# Patient Record
Sex: Male | Born: 1955 | Race: White | Hispanic: No | Smoking: Former smoker
Health system: Southern US, Community
[De-identification: ages and names within clinical notes are randomized; demographics above are authoritative.]

## PROBLEM LIST (undated history)

## (undated) DIAGNOSIS — F32A Depression, unspecified: Secondary | ICD-10-CM

## (undated) DIAGNOSIS — R0600 Dyspnea, unspecified: Secondary | ICD-10-CM

## (undated) DIAGNOSIS — S2239XA Fracture of one rib, unspecified side, initial encounter for closed fracture: Secondary | ICD-10-CM

## (undated) DIAGNOSIS — K08109 Complete loss of teeth, unspecified cause, unspecified class: Secondary | ICD-10-CM

## (undated) DIAGNOSIS — J449 Chronic obstructive pulmonary disease, unspecified: Secondary | ICD-10-CM

## (undated) DIAGNOSIS — J45909 Unspecified asthma, uncomplicated: Secondary | ICD-10-CM

## (undated) DIAGNOSIS — F329 Major depressive disorder, single episode, unspecified: Secondary | ICD-10-CM

## (undated) DIAGNOSIS — S2249XA Multiple fractures of ribs, unspecified side, initial encounter for closed fracture: Secondary | ICD-10-CM

## (undated) DIAGNOSIS — R0902 Hypoxemia: Secondary | ICD-10-CM

## (undated) DIAGNOSIS — F319 Bipolar disorder, unspecified: Secondary | ICD-10-CM

## (undated) DIAGNOSIS — J189 Pneumonia, unspecified organism: Secondary | ICD-10-CM

## (undated) DIAGNOSIS — C61 Malignant neoplasm of prostate: Secondary | ICD-10-CM

## (undated) DIAGNOSIS — C189 Malignant neoplasm of colon, unspecified: Secondary | ICD-10-CM

## (undated) DIAGNOSIS — J439 Emphysema, unspecified: Secondary | ICD-10-CM

## (undated) DIAGNOSIS — J9620 Acute and chronic respiratory failure, unspecified whether with hypoxia or hypercapnia: Secondary | ICD-10-CM

## (undated) DIAGNOSIS — Z9981 Dependence on supplemental oxygen: Secondary | ICD-10-CM

## (undated) DIAGNOSIS — Z972 Presence of dental prosthetic device (complete) (partial): Secondary | ICD-10-CM

## (undated) HISTORY — DX: Presence of dental prosthetic device (complete) (partial): Z97.2

## (undated) HISTORY — PX: HERNIA REPAIR: SHX51

## (undated) HISTORY — DX: Hypoxemia: R09.02

## (undated) HISTORY — PX: PROSTATE BIOPSY: SHX241

## (undated) HISTORY — DX: Complete loss of teeth, unspecified cause, unspecified class: K08.109

---

## 2000-12-22 ENCOUNTER — Encounter: Payer: Self-pay | Admitting: *Deleted

## 2000-12-22 ENCOUNTER — Emergency Department (HOSPITAL_COMMUNITY): Admission: EM | Admit: 2000-12-22 | Discharge: 2000-12-22 | Payer: Self-pay | Admitting: *Deleted

## 2009-06-20 ENCOUNTER — Emergency Department (HOSPITAL_COMMUNITY)
Admission: EM | Admit: 2009-06-20 | Discharge: 2009-06-21 | Payer: Self-pay | Source: Home / Self Care | Admitting: Emergency Medicine

## 2010-04-09 LAB — CBC
HCT: 45.1 % (ref 39.0–52.0)
Hemoglobin: 15.4 g/dL (ref 13.0–17.0)
MCHC: 34.1 g/dL (ref 30.0–36.0)
MCV: 95.8 fL (ref 78.0–100.0)
Platelets: 230 10*3/uL (ref 150–400)
RBC: 4.71 MIL/uL (ref 4.22–5.81)
RDW: 13.1 % (ref 11.5–15.5)
WBC: 12.2 10*3/uL — ABNORMAL HIGH (ref 4.0–10.5)

## 2010-04-09 LAB — BASIC METABOLIC PANEL
BUN: 7 mg/dL (ref 6–23)
CO2: 25 mEq/L (ref 19–32)
Calcium: 8.5 mg/dL (ref 8.4–10.5)
Chloride: 98 mEq/L (ref 96–112)
Creatinine, Ser: 1.06 mg/dL (ref 0.4–1.5)
GFR calc Af Amer: >60 mL/min (ref >60)
GFR calc non Af Amer: >60 mL/min (ref >60)
Glucose, Bld: 130 mg/dL — ABNORMAL HIGH (ref 70–99)
Potassium: 3.7 mEq/L (ref 3.5–5.1)
Sodium: 132 mEq/L — ABNORMAL LOW (ref 135–145)

## 2010-04-09 LAB — DIFFERENTIAL
Basophils Absolute: 0 10*3/uL (ref 0.0–0.1)
Basophils Relative: 0 % (ref 0–1)
Eosinophils Absolute: 0 10*3/uL (ref 0.0–0.7)
Eosinophils Relative: 0 % (ref 0–5)
Lymphocytes Relative: 16 % (ref 12–46)
Lymphs Abs: 2 10*3/uL (ref 0.7–4.0)
Monocytes Absolute: 1 10*3/uL (ref 0.1–1.0)
Monocytes Relative: 8 % (ref 3–12)
Neutro Abs: 9.2 10*3/uL — ABNORMAL HIGH (ref 1.7–7.7)
Neutrophils Relative %: 76 % (ref 43–77)

## 2010-10-26 ENCOUNTER — Observation Stay (HOSPITAL_COMMUNITY)
Admission: EM | Admit: 2010-10-26 | Discharge: 2010-10-26 | Disposition: A | Discharge status: Home / Self Care | Payer: Medicaid Other | Attending: Emergency Medicine | Admitting: Emergency Medicine

## 2010-10-26 ENCOUNTER — Emergency Department (HOSPITAL_COMMUNITY): Payer: Medicaid Other

## 2010-10-26 DIAGNOSIS — J9801 Acute bronchospasm: Principal | ICD-10-CM | POA: Insufficient documentation

## 2010-10-26 DIAGNOSIS — R0602 Shortness of breath: Secondary | ICD-10-CM | POA: Insufficient documentation

## 2010-10-26 LAB — POCT I-STAT, CHEM 8
Calcium, Ion: 1.05 mmol/L — ABNORMAL LOW (ref 1.12–1.32)
Creatinine, Ser: 1.3 mg/dL (ref 0.50–1.35)
Glucose, Bld: 186 mg/dL — ABNORMAL HIGH (ref 70–99)
HCT: 50 % (ref 39.0–52.0)
Hemoglobin: 17 g/dL (ref 13.0–17.0)
TCO2: 25 mmol/L (ref 0–100)

## 2010-10-26 LAB — CBC
Hemoglobin: 16.2 g/dL (ref 13.0–17.0)
MCH: 32.9 pg (ref 26.0–34.0)
MCHC: 35.7 g/dL (ref 30.0–36.0)
RDW: 12.9 % (ref 11.5–15.5)

## 2010-10-26 LAB — POCT I-STAT 3, VENOUS BLOOD GAS (G3P V)
Acid-Base Excess: 2 mmol/L (ref 0.0–2.0)
Bicarbonate: 28.5 mEq/L — ABNORMAL HIGH (ref 20.0–24.0)
O2 Saturation: 80 %
TCO2: 30 mmol/L (ref 0–100)
pCO2, Ven: 52 mmHg — ABNORMAL HIGH (ref 45.0–50.0)
pO2, Ven: 48 mmHg — ABNORMAL HIGH (ref 30.0–45.0)

## 2010-10-26 LAB — DIFFERENTIAL
Basophils Absolute: 0 10*3/uL (ref 0.0–0.1)
Basophils Relative: 0 % (ref 0–1)
Eosinophils Absolute: 0.5 10*3/uL (ref 0.0–0.7)
Eosinophils Relative: 5 % (ref 0–5)
Lymphocytes Relative: 52 % — ABNORMAL HIGH (ref 12–46)
Monocytes Absolute: 0.7 10*3/uL (ref 0.1–1.0)

## 2010-10-26 LAB — POCT I-STAT TROPONIN I: Troponin i, poc: 0 ng/mL (ref 0.00–0.08)

## 2011-04-02 ENCOUNTER — Emergency Department (HOSPITAL_COMMUNITY)
Admission: EM | Admit: 2011-04-02 | Discharge: 2011-04-03 | Disposition: A | Discharge status: Home / Self Care | Payer: Medicaid Other | Attending: Emergency Medicine | Admitting: Emergency Medicine

## 2011-04-02 ENCOUNTER — Encounter (HOSPITAL_COMMUNITY): Payer: Self-pay | Admitting: *Deleted

## 2011-04-02 DIAGNOSIS — C189 Malignant neoplasm of colon, unspecified: Secondary | ICD-10-CM | POA: Insufficient documentation

## 2011-04-02 DIAGNOSIS — R300 Dysuria: Secondary | ICD-10-CM | POA: Insufficient documentation

## 2011-04-02 DIAGNOSIS — K4090 Unilateral inguinal hernia, without obstruction or gangrene, not specified as recurrent: Secondary | ICD-10-CM | POA: Insufficient documentation

## 2011-04-02 DIAGNOSIS — R109 Unspecified abdominal pain: Secondary | ICD-10-CM | POA: Insufficient documentation

## 2011-04-02 HISTORY — DX: Major depressive disorder, single episode, unspecified: F32.9

## 2011-04-02 HISTORY — DX: Depression, unspecified: F32.A

## 2011-04-02 NOTE — ED Notes (Signed)
Pt in c/o right lower abd pain, states he has an inguinal hernia and feels like this is causing the pain

## 2011-04-03 ENCOUNTER — Emergency Department (HOSPITAL_COMMUNITY): Payer: Medicaid Other

## 2011-04-03 LAB — URINALYSIS, ROUTINE W REFLEX MICROSCOPIC
Bilirubin Urine: NEGATIVE
Leukocytes, UA: NEGATIVE
Nitrite: NEGATIVE
Specific Gravity, Urine: 1.023 (ref 1.005–1.030)
Urobilinogen, UA: 0.2 mg/dL (ref 0.0–1.0)
pH: 5 (ref 5.0–8.0)

## 2011-04-03 LAB — CBC
HCT: 47.7 % (ref 39.0–52.0)
RBC: 5.17 MIL/uL (ref 4.22–5.81)
RDW: 12.5 % (ref 11.5–15.5)

## 2011-04-03 LAB — DIFFERENTIAL
Basophils Absolute: 0 10*3/uL (ref 0.0–0.1)
Lymphocytes Relative: 37 % (ref 12–46)
Monocytes Absolute: 0.6 10*3/uL (ref 0.1–1.0)
Neutro Abs: 3.6 10*3/uL (ref 1.7–7.7)
Neutrophils Relative %: 50 % (ref 43–77)

## 2011-04-03 LAB — POCT I-STAT, CHEM 8
BUN: 9 mg/dL (ref 6–23)
Calcium, Ion: 1.18 mmol/L (ref 1.12–1.32)
Chloride: 104 mEq/L (ref 96–112)
Creatinine, Ser: 1.1 mg/dL (ref 0.50–1.35)
Glucose, Bld: 118 mg/dL — ABNORMAL HIGH (ref 70–99)
TCO2: 27 mmol/L (ref 0–100)

## 2011-04-03 MED ORDER — KETOROLAC TROMETHAMINE 30 MG/ML IJ SOLN
30.0000 mg | Freq: Once | INTRAMUSCULAR | Status: AC
  Administered 2011-04-03: 30 mg via INTRAVENOUS
  Filled 2011-04-03: qty 1

## 2011-04-03 MED ORDER — HYDROCODONE-ACETAMINOPHEN 5-500 MG PO TABS: 1.0000 | ORAL_TABLET | Freq: Four times a day (QID) | ORAL | Status: AC | PRN

## 2011-04-03 NOTE — ED Provider Notes (Signed)
History     CSN: [id number]  Arrival date & time 04/02/11  2119   First MD Initiated Contact with Patient 04/03/11 0210      Chief Complaint  Patient presents with  . Abdominal Pain    (Consider location/radiation/quality/duration/timing/severity/associated sxs/prior treatment) Patient is a 56 y.o. male presenting with abdominal pain. The history is provided by the patient. No language interpreter was used.  Abdominal Pain The primary symptoms of the illness include abdominal pain and dysuria. The primary symptoms of the illness do not include fatigue, shortness of breath, nausea, vomiting, diarrhea, hematemesis or hematochezia. Primary symptoms comment: 3 weeks The current episode started more than 2 days ago. The onset of the illness was gradual. The problem has been gradually worsening.  The abdominal pain is located in the groin (right groin). The abdominal pain does not radiate. The severity of the abdominal pain is 9/10. The abdominal pain is relieved by nothing. The abdominal pain is exacerbated by vomiting.  The dysuria is associated with frequency. The dysuria is not associated with hematuria.  Additional symptoms associated with the illness include frequency. Symptoms associated with the illness do not include anorexia, diaphoresis, constipation or hematuria. Significant associated medical issues do not include HIV or cardiac disease.  On repeat questioning may have had some change in stool caliber in the past several weeks.    Past Medical History  Diagnosis Date  . Inguinal hernia   . Depression     History reviewed. No pertinent past surgical history.  History reviewed. No pertinent family history.  History  Substance Use Topics  . Smoking status: Not on file  . Smokeless tobacco: Not on file  . Alcohol Use:       Review of Systems  Constitutional: Negative for diaphoresis and fatigue.  HENT: Negative.   Respiratory: Negative for shortness of breath.     Cardiovascular: Negative for chest pain.  Gastrointestinal: Positive for abdominal pain. Negative for nausea, vomiting, diarrhea, constipation, hematochezia, abdominal distention, anorexia and hematemesis.  Genitourinary: Positive for dysuria and frequency. Negative for hematuria.  Musculoskeletal: Negative.   Skin: Negative.   Neurological: Negative.   Hematological: Negative.   Psychiatric/Behavioral: Negative.     Allergies  Codeine  Home Medications   Current Outpatient Rx  Name Route Sig Dispense Refill  . ARIPIPRAZOLE 10 MG PO TABS Oral Take 10 mg by mouth daily.    Marland Kitchen CLONAZEPAM 0.5 MG PO TABS Oral Take 0.5 mg by mouth every morning.    Marland Kitchen ESCITALOPRAM OXALATE 20 MG PO TABS Oral Take 20 mg by mouth daily.    . TRAZODONE HCL 50 MG PO TABS Oral Take 50 mg by mouth at bedtime.      BP 113/65  Pulse 73  Temp(Src) 98.1 F (36.7 C) (Oral)  Resp 20  SpO2 95%  Physical Exam  Constitutional: He is oriented to person, place, and time. He appears well-developed and well-nourished. No distress.  HENT:  Head: Normocephalic and atraumatic.  Mouth/Throat: Oropharynx is clear and moist. No oropharyngeal exudate.  Eyes: Conjunctivae are normal. Pupils are equal, round, and reactive to light.  Neck: Normal range of motion. Neck supple.  Cardiovascular: Normal rate and regular rhythm.   Pulmonary/Chest: Effort normal and breath sounds normal. He has no wheezes. He has no rales.  Abdominal: Soft. Bowel sounds are normal. There is no tenderness. There is no rebound and no guarding.       Small inguinal hernia  Musculoskeletal: Normal range of motion.  Neurological: He is alert and oriented to person, place, and time.  Skin: Skin is warm and dry.  Psychiatric: He has a normal mood and affect.    ED Course  Procedures (including critical care time)  Labs Reviewed  POCT I-STAT, CHEM 8 - Abnormal; Notable for the following:    Glucose, Bld 118 (*)    All other components within  normal limits  CBC  DIFFERENTIAL  URINALYSIS, ROUTINE W REFLEX MICROSCOPIC  URINE CULTURE   Ct Abdomen Pelvis Wo Contrast  04/03/2011  *RADIOLOGY REPORT*  Clinical Data: Right groin and lower abdominal pain.  CT ABDOMEN AND PELVIS WITHOUT CONTRAST  Technique:  Multidetector CT imaging of the abdomen and pelvis was performed following the standard protocol without intravenous contrast.  Comparison: None.  Findings: Limited images through the lung bases demonstrate no significant appreciable abnormality. The heart size is within normal limits. No pleural or pericardial effusion.  Intra-abdominal organ evaluation is limited without intravenous contrast.  Within this limitation, unremarkable liver, spleen, pancreas, biliary system, adrenal glands.  Symmetric renal size.  No hydronephrosis or hydroureter.  There is eccentric sigmoid colonic wall thickening and a 1.9 cm soft tissue attenuation that abuts the sigmoid colon wall. This may represent nodal conglomerate.  There are additional subcentimeter regional lymph nodes. Colonic diverticulosis.  Normal appendix.  No bowel obstruction.  No free intraperitoneal air or fluid.  There is scattered atherosclerotic calcification of the aorta and its branches. No aneurysmal dilatation.  Circumferential bladder wall thickening is nonspecific given incomplete distension.  Bilateral fat containing inguinal hernias, right greater than left.  Multilevel degenerative changes of the imaged spine. No acute or aggressive appearing osseous lesion.  IMPRESSION: Sigmoid colonic wall thickening and pericolonic soft tissue attenuation/nodes.  This is concerning for adenocarcinoma. Recommend colonoscopy.  Fat containing right inguinal hernia.  Discussed via telephone with Dr. Nicanor Alcon 03:45 a.m. on 04/03/2011.  Original Report Authenticated By: Waneta Martins, M.D.     No diagnosis found.    MDM  420 case d/w Dr. Madilyn Fireman of GI who took patient's information.  Call office in  am for close follow up CT results discussed with patient, patient informed CT findings highly suspicious for cancer of colon and that he will need to call Dr. Madilyn Fireman office in am for colonoscopy and ongoing care.  Patient verbalizes understanding and agrees to follow up.  Dr. Florina Ou name and contact information provided on d/c paper work and referral to general surgery for fat containing hernia        Betsabe Iglesia K Delwin Raczkowski-Rasch, MD 04/03/11 [phone]

## 2011-04-03 NOTE — Discharge Instructions (Signed)
Hernia  A hernia occurs when an internal organ pushes out through a weak spot in the abdominal wall. Hernias most commonly occur in the groin and around the navel. Hernias often can be pushed back into place (reduced). Most hernias tend to get worse over time. Some abdominal hernias can get stuck in the opening (irreducible or incarcerated hernia) and cannot be reduced. An irreducible abdominal hernia which is tightly squeezed into the opening is at risk for impaired blood supply (strangulated hernia). A strangulated hernia is a medical emergency. Because of the risk for an irreducible or strangulated hernia, surgery may be recommended to repair a hernia.  CAUSES    Heavy lifting.   Prolonged coughing.   Straining to have a bowel movement.   A cut (incision) made during an abdominal surgery.  HOME CARE INSTRUCTIONS    Bed rest is not required. You may continue your normal activities.   Avoid lifting more than 10 pounds (4.5 kg) or straining.   Cough gently. If you are a smoker it is best to stop. Even the best hernia repair can break down with the continual strain of coughing. Even if you do not have your hernia repaired, a cough will continue to aggravate the problem.   Do not wear anything tight over your hernia. Do not try to keep it in with an outside bandage or truss. These can damage abdominal contents if they are trapped within the hernia sac.   Eat a normal diet.   Avoid constipation. Straining over long periods of time will increase hernia size and encourage breakdown of repairs. If you cannot do this with diet alone, stool softeners may be used.  SEEK IMMEDIATE MEDICAL CARE IF:    You have a fever.   You develop increasing abdominal pain.   You feel nauseous or vomit.   Your hernia is stuck outside the abdomen, looks discolored, feels hard, or is tender.   You have any changes in your bowel habits or in the hernia that are unusual for you.   You have increased pain or swelling around the  hernia.   You cannot push the hernia back in place by applying gentle pressure while lying down.  MAKE SURE YOU:    Understand these instructions.   Will watch your condition.   Will get help right away if you are not doing well or get worse.  Document Released: 01/07/2005 Document Revised: 12/27/2010 Document Reviewed: 08/27/2007  ExitCare Patient Information 2012 [employer].

## 2011-04-05 LAB — URINE CULTURE: Culture  Setup Time: 201303130828

## 2011-04-06 NOTE — ED Notes (Signed)
+   Urine Chart sent to EDP office for review. 

## 2011-04-07 NOTE — ED Notes (Signed)
Chart back from EDP office; no further treatment necessary

## 2011-04-08 ENCOUNTER — Other Ambulatory Visit (INDEPENDENT_AMBULATORY_CARE_PROVIDER_SITE_OTHER): Payer: Self-pay | Admitting: Surgery

## 2011-04-08 ENCOUNTER — Encounter (INDEPENDENT_AMBULATORY_CARE_PROVIDER_SITE_OTHER): Payer: Self-pay | Admitting: Surgery

## 2011-04-08 ENCOUNTER — Ambulatory Visit (INDEPENDENT_AMBULATORY_CARE_PROVIDER_SITE_OTHER): Payer: Medicaid Other | Admitting: Surgery

## 2011-04-08 VITALS — BP 120/82 | HR 80 | Temp 98.2°F | Resp 18 | Ht 71.0 in | Wt 193.6 lb

## 2011-04-08 DIAGNOSIS — C189 Malignant neoplasm of colon, unspecified: Secondary | ICD-10-CM | POA: Insufficient documentation

## 2011-04-08 NOTE — Progress Notes (Signed)
Chief Complaint  Patient presents with  . Colon Cancer    new pt- eval sigmoid colon cancer - referral from Dr. Vida Rigger    HISTORY: Patient is a 56 year old white male who had noted 3-4 weeks ago a significant change in his bowel movements. He noted that his bowel movements have become slow and more difficult. He denies any bleeding per rectum. He denies any recent changes in his weight. Patient developed some mild abdominal distention and presented to the emergency department. A CT scan of the abdomen and pelvis was performed on March 13. This showed thickening in the manner of the sigmoid colon with associated lymphadenopathy worrisome for adenocarcinoma. The patient was seen the next day by Dr. Leary Roca and underwent colonoscopy.  This showed a tumor at approximately 20 cm from the anus which was near obstructing. The scope was able to be passed proximally into the transverse colon. Findings were consistent with adenocarcinoma of the sigmoid colon. Patient is referred at this time for surgical assessment for resection.  Concurrently, the patient does have a right inguinal hernia which is mildly symptomatic. He has had no prior abdominal surgery. He has had no prior hernia repairs. There is no family history of colon cancer.  Past Medical History  Diagnosis Date  . Inguinal hernia   . Depression   . Colon tumor      Current Outpatient Prescriptions  Medication Sig Dispense Refill  . ARIPiprazole (ABILIFY) 10 MG tablet Take 10 mg by mouth daily.      . clonazePAM (KLONOPIN) 0.5 MG tablet Take 0.5 mg by mouth every morning.      . escitalopram (LEXAPRO) 20 MG tablet Take 20 mg by mouth daily.      Marland Kitchen HYDROcodone-acetaminophen (VICODIN) 5-500 MG per tablet Take 1 tablet by mouth every 6 (six) hours as needed for pain.  10 tablet  0  . traZODone (DESYREL) 50 MG tablet Take 50 mg by mouth at bedtime.         Allergies  Allergen Reactions  . Codeine Nausea And Vomiting     Family  History  Problem Relation Age of Onset  . Heart disease Father      History   Social History  . Marital Status: Legally Separated    Spouse Name: N/A    Number of Children: N/A  . Years of Education: N/A   Social History Main Topics  . Smoking status: Current Everyday Smoker -- 1.0 packs/day  . Smokeless tobacco: None  . Alcohol Use: No  . Drug Use: No  . Sexually Active:    Other Topics Concern  . None   Social History Narrative  . None     REVIEW OF SYSTEMS - PERTINENT POSITIVES ONLY: Recent change in bowel habits with mild obstructive symptoms. No bleeding per rectum. No weight changes.  EXAM: Filed Vitals:   04/08/11 1649  BP: 120/82  Pulse: 80  Temp: 98.2 F (36.8 C)  Resp: 18    HEENT: normocephalic; pupils equal and reactive; sclerae clear; dentition good; mucous membranes moist NECK:  symmetric on extension; no palpable anterior or posterior cervical lymphadenopathy; no supraclavicular masses; no tenderness CHEST: clear to auscultation bilaterally without rales, rhonchi, or wheezes CARDIAC: regular rate and rhythm without significant murmur; peripheral pulses are full ABDOMEN: soft without distension; bowel sounds present; no mass; no hepatosplenomegaly; no hernia EXT:  non-tender without edema; no deformity NEURO: no gross focal deficits; no sign of tremor   LABORATORY RESULTS: See  [facility] ([facility]-Epic) for most recent results   RADIOLOGY RESULTS: See [facility] ([facility]L-Epic) for most recent results   IMPRESSION: #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 I have recommended that he undergo exploratory laparotomy with sigmoid colectomy. I quoted him a risk of colostomy placement of less than 5%.  I have recommended that the patient proceed with surgery in the near future. I am  concerned about possible obstruction. I will offer him surgery dates later this week. Patient I discussed the hospital stay to be anticipated. We discussed potential complications including bleeding and infection. We discussed the postoperative recovery.  We discussed the need for consultation from medical oncology and the possible need for adjuvant chemotherapy or radiation therapy.  He understands and wishes to proceed with surgery in the near future.  The risks and benefits of the procedure have been discussed at length with the patient.  The patient understands the proposed procedure, potential alternative treatments, and the course of recovery to be expected.  All of the patient's questions have been answered at this time.  The patient wishes to proceed with surgery and will schedule a date for the procedure through our office staff.  Velora Heckler, MD, FACS General & Endocrine Surgery [facility]   Visit Diagnoses: 1. Colon cancer, sigmoid     Primary Care Physician: Lynne Leader, MD, MD  GI:  Dr. Vida Rigger

## 2011-04-08 NOTE — Patient Instructions (Signed)
Cancer of the Colon, Treatment by Resection You and your caregiver have decided that surgical removal of your colon cancer is the best form of treatment for you. Your surgeon or surgeons will do their best to remove your entire tumor. To do this, some normal tissue must also be removed to give you the best chance for a cure. The following will help describe what happens when you have this surgery. TREATMENT  Surgery is the most common treatment for colorectal cancer. It is a type of local therapy. It treats the cancer in the colon or rectum and the area close to the tumor by removing the tumor and some of the healthy tissue around it. For larger cancers, your surgeon must make an cut (incision) into the belly (abdomen) so he or she can see the area of the tumor and remove it as well as part of the healthy colon or rectum. Some nearby lymph nodes also may be removed. The surgeon checks the rest of the abdomen, the intestine and the liver to see if the cancer has spread. When a section of the colon or rectum is removed, the surgeon can usually reconnect the healthy parts. However, sometimes reconnection is not possible. In this case, the surgeon creates a new path for waste to leave the body. The surgeon makes an opening (a stoma) in the wall of the abdomen. The upper end of the intestine is then connected to the stoma. The other end is closed. The operation to create the stoma is called a colostomy. A flat bag fits over the stoma to collect waste, and a special adhesive holds it in place.  Some colostomies are temporary. The colostomy is needed only until the colon or rectum heals from surgery. After healing takes place, the surgeon reconnects the parts of the intestine and closes the stoma. Other patients need a permanent colostomy.  ASK YOUR CAREGIVER THESE QUESTIONS BEFORE HAVING SURGERY:  What kind of operation do you recommend for me?   Do I need any lymph nodes removed? Will other tissues be removed?  Why?   What are the risks of surgery? Will I have any lasting side effects?   Will I need a colostomy? If so, will it be permanent?   How will I feel after the operation?   If I have pain, how will it be controlled?   How long will I be in the hospital?   When can I get back to my normal activities?  FOLLOW-UP CARE  Follow-up care after treatment for colorectal cancer is important. Even when the cancer seems to have been completely removed or destroyed, the disease sometimes returns. Undetected cancer cells may still remain somewhere in the body after treatment. The doctor keeps checking the person's recovery and checks for recurrence of the cancer. Recurrence means that the cancer comes back.  Checkups help make sure that changes in health are found. Checkups may include:  A physical exam (including a digital rectal exam). This means your caregiver checks you to see if there are any abnormal changes they can see or feel.   Lab tests (including fecal occult blood test and CEA test) may be done. The "fecal occult blood test" checks for blood in the stool. The CEA (carcinoembryonic antigen) is a blood test that looks for a marker of colon cancer in the blood.   A colonoscopy is a test where your caregiver examines your colon with a flexible instrument like a thin telescope which looks at the inside   of the large bowel.   Other specialized x-rays, CT scans, or other tests may be performed.  Between scheduled visits you should contact your caregivers as soon as any health problems appear. Document Released: 01/10/2003 Document Revised: 12/27/2010 Document Reviewed: 05/05/2007 ExitCare Patient Information 2012 [employer]. 

## 2011-04-09 ENCOUNTER — Encounter (INDEPENDENT_AMBULATORY_CARE_PROVIDER_SITE_OTHER): Payer: Self-pay

## 2011-04-09 ENCOUNTER — Encounter (HOSPITAL_COMMUNITY): Payer: Self-pay | Admitting: Pharmacy Technician

## 2011-04-10 ENCOUNTER — Encounter (INDEPENDENT_AMBULATORY_CARE_PROVIDER_SITE_OTHER): Payer: Self-pay

## 2011-04-10 ENCOUNTER — Encounter (HOSPITAL_COMMUNITY)
Admission: RE | Admit: 2011-04-10 | Discharge: 2011-04-10 | Disposition: A | Discharge status: Home / Self Care | Payer: Medicaid Other | Source: Ambulatory Visit | Attending: Surgery | Admitting: Surgery

## 2011-04-10 ENCOUNTER — Encounter (HOSPITAL_COMMUNITY): Payer: Self-pay

## 2011-04-10 LAB — CBC
HCT: 51.2 % (ref 39.0–52.0)
MCH: 31.2 pg (ref 26.0–34.0)
MCHC: 33.6 g/dL (ref 30.0–36.0)
MCV: 92.8 fL (ref 78.0–100.0)
Platelets: 283 10*3/uL (ref 150–400)
RDW: 12.5 % (ref 11.5–15.5)
WBC: 10 10*3/uL (ref 4.0–10.5)

## 2011-04-10 LAB — COMPREHENSIVE METABOLIC PANEL
AST: 25 U/L (ref 0–37)
Albumin: 4.5 g/dL (ref 3.5–5.2)
BUN: 10 mg/dL (ref 6–23)
Calcium: 9.3 mg/dL (ref 8.4–10.5)
Creatinine, Ser: 1.04 mg/dL (ref 0.50–1.35)
Total Bilirubin: 0.4 mg/dL (ref 0.3–1.2)
Total Protein: 8 g/dL (ref 6.0–8.3)

## 2011-04-10 LAB — DIFFERENTIAL
Basophils Relative: 0 % (ref 0–1)
Lymphocytes Relative: 26 % (ref 12–46)
Monocytes Absolute: 0.9 10*3/uL (ref 0.1–1.0)
Monocytes Relative: 9 % (ref 3–12)
Neutro Abs: 5.9 10*3/uL (ref 1.7–7.7)
Neutrophils Relative %: 59 % (ref 43–77)

## 2011-04-10 LAB — URINALYSIS, ROUTINE W REFLEX MICROSCOPIC
Glucose, UA: NEGATIVE mg/dL
Ketones, ur: NEGATIVE mg/dL
Leukocytes, UA: NEGATIVE
Nitrite: NEGATIVE
Protein, ur: NEGATIVE mg/dL
pH: 5.5 (ref 5.0–8.0)

## 2011-04-10 LAB — CEA: CEA: 2.4 ng/mL (ref 0.0–5.0)

## 2011-04-10 LAB — SURGICAL PCR SCREEN: MRSA, PCR: NEGATIVE

## 2011-04-10 LAB — PROTIME-INR: INR: 0.9 (ref 0.00–1.49)

## 2011-04-10 NOTE — Progress Notes (Signed)
Faxed to [place] 

## 2011-04-10 NOTE — Patient Instructions (Signed)
[name]  04/10/2011   Your procedure is scheduled on:  04/11/11 1250pm-1540pm  Report to [facility] at 1030 AM.  Call this number if you have problems the morning of surgery: [phone]   Remember:   Do not eat food:After Midnight.  May have clear liquids:until Midnight .   Take these medicines the morning of surgery with A SIP OF WATER:    Do not wear jewelry, .  Do not wear lotions, powders, or perfumes  Do not bring valuables to the hospital.  Contacts, dentures or bridgework may not be worn into surgery.  Leave suitcase in the car. After surgery it may be brought to your room.  For patients admitted to the hospital, checkout time is 11:00 AM the day of discharge.     Special Instructions: CHG Shower Use Special Wash: 1/2 bottle night before surgery and 1/2 bottle morning of surgery. shower chin to toes with CHG.  Wash face and private parts with regular soap.     Please read over the following fact sheets that you were given: MRSA Information, Blood Transfusion Fact Sheet, coughing and deep breathing exercises, leg exercises

## 2011-04-10 NOTE — Progress Notes (Signed)
Quick Note:  These results are acceptable for scheduled surgery. TMG ______ 

## 2011-04-11 ENCOUNTER — Encounter (HOSPITAL_COMMUNITY): Payer: Self-pay | Admitting: *Deleted

## 2011-04-11 ENCOUNTER — Encounter (HOSPITAL_COMMUNITY): Payer: Self-pay | Admitting: Surgery

## 2011-04-11 ENCOUNTER — Ambulatory Visit (HOSPITAL_COMMUNITY): Payer: Medicaid Other | Admitting: Anesthesiology

## 2011-04-11 ENCOUNTER — Encounter (HOSPITAL_COMMUNITY): Payer: Self-pay | Admitting: Anesthesiology

## 2011-04-11 ENCOUNTER — Encounter (HOSPITAL_COMMUNITY)
Admission: RE | Disposition: A | Discharge status: Home / Self Care | Payer: Self-pay | Source: Ambulatory Visit | Attending: Surgery

## 2011-04-11 ENCOUNTER — Inpatient Hospital Stay (HOSPITAL_COMMUNITY)
Admission: RE | Admit: 2011-04-11 | Discharge: 2011-04-18 | DRG: 333 | Disposition: A | Discharge status: Home / Self Care | Payer: Medicaid Other | Source: Ambulatory Visit | Attending: Surgery | Admitting: Surgery

## 2011-04-11 DIAGNOSIS — F329 Major depressive disorder, single episode, unspecified: Secondary | ICD-10-CM | POA: Diagnosis present

## 2011-04-11 DIAGNOSIS — C189 Malignant neoplasm of colon, unspecified: Secondary | ICD-10-CM

## 2011-04-11 DIAGNOSIS — K560 Paralytic ileus: Secondary | ICD-10-CM | POA: Diagnosis not present

## 2011-04-11 DIAGNOSIS — F3289 Other specified depressive episodes: Secondary | ICD-10-CM | POA: Diagnosis present

## 2011-04-11 DIAGNOSIS — C19 Malignant neoplasm of rectosigmoid junction: Principal | ICD-10-CM | POA: Diagnosis present

## 2011-04-11 DIAGNOSIS — C779 Secondary and unspecified malignant neoplasm of lymph node, unspecified: Secondary | ICD-10-CM | POA: Diagnosis present

## 2011-04-11 DIAGNOSIS — F17200 Nicotine dependence, unspecified, uncomplicated: Secondary | ICD-10-CM | POA: Diagnosis present

## 2011-04-11 HISTORY — DX: Malignant neoplasm of colon, unspecified: C18.9

## 2011-04-11 HISTORY — PX: COLON SURGERY: SHX602

## 2011-04-11 HISTORY — PX: COLOSTOMY REVISION: SHX5232

## 2011-04-11 LAB — CBC
HCT: 45.7 % (ref 39.0–52.0)
Hemoglobin: 15.2 g/dL (ref 13.0–17.0)
WBC: 15.3 10*3/uL — ABNORMAL HIGH (ref 4.0–10.5)

## 2011-04-11 LAB — CREATININE, SERUM
GFR calc Af Amer: >90 mL/min (ref >90)
GFR calc non Af Amer: 80 mL/min — ABNORMAL LOW (ref >90)

## 2011-04-11 LAB — ABO/RH: ABO/RH(D): O NEG

## 2011-04-11 SURGERY — COLECTOMY, SIGMOID, OPEN
Anesthesia: General | Site: Abdomen | Wound class: Clean Contaminated

## 2011-04-11 MED ORDER — ACETAMINOPHEN 10 MG/ML IV SOLN
INTRAVENOUS | Status: AC
  Filled 2011-04-11: qty 100

## 2011-04-11 MED ORDER — ACETAMINOPHEN 10 MG/ML IV SOLN
INTRAVENOUS | Status: DC | PRN
  Administered 2011-04-11: 1000 mg via INTRAVENOUS

## 2011-04-11 MED ORDER — PROPOFOL 10 MG/ML IV EMUL
INTRAVENOUS | Status: DC | PRN
  Administered 2011-04-11: 150 mg via INTRAVENOUS

## 2011-04-11 MED ORDER — ONDANSETRON HCL 4 MG/2ML IJ SOLN
4.0000 mg | Freq: Four times a day (QID) | INTRAMUSCULAR | Status: DC | PRN
  Filled 2011-04-11: qty 2

## 2011-04-11 MED ORDER — KETOROLAC TROMETHAMINE 30 MG/ML IJ SOLN: 15.0000 mg | Freq: Once | INTRAMUSCULAR | Status: DC | PRN

## 2011-04-11 MED ORDER — ALVIMOPAN 12 MG PO CAPS
12.0000 mg | ORAL_CAPSULE | Freq: Two times a day (BID) | ORAL | Status: DC
  Administered 2011-04-12: 12 mg via ORAL
  Filled 2011-04-11 (×4): qty 1

## 2011-04-11 MED ORDER — KCL IN DEXTROSE-NACL 30-5-0.45 MEQ/L-%-% IV SOLN
INTRAVENOUS | Status: DC
  Administered 2011-04-11 – 2011-04-13 (×5): via INTRAVENOUS
  Administered 2011-04-13: 100 mL/h via INTRAVENOUS
  Administered 2011-04-14 – 2011-04-17 (×6): via INTRAVENOUS
  Filled 2011-04-11 (×18): qty 1000

## 2011-04-11 MED ORDER — ONDANSETRON HCL 4 MG PO TABS: 4.0000 mg | ORAL_TABLET | Freq: Four times a day (QID) | ORAL | Status: DC | PRN

## 2011-04-11 MED ORDER — ONDANSETRON HCL 4 MG/2ML IJ SOLN
INTRAMUSCULAR | Status: DC | PRN
  Administered 2011-04-11: 4 mg via INTRAVENOUS

## 2011-04-11 MED ORDER — ENOXAPARIN SODIUM 40 MG/0.4ML ~~LOC~~ SOLN
40.0000 mg | SUBCUTANEOUS | Status: DC
  Administered 2011-04-12 – 2011-04-17 (×6): 40 mg via SUBCUTANEOUS
  Filled 2011-04-11 (×8): qty 0.4

## 2011-04-11 MED ORDER — NALOXONE HCL 0.4 MG/ML IJ SOLN: 0.4000 mg | INTRAMUSCULAR | Status: DC | PRN

## 2011-04-11 MED ORDER — HYDROMORPHONE HCL PF 1 MG/ML IJ SOLN
INTRAMUSCULAR | Status: DC | PRN
  Administered 2011-04-11 (×4): 0.5 mg via INTRAVENOUS

## 2011-04-11 MED ORDER — SODIUM CHLORIDE 0.9 % IV SOLN
INTRAVENOUS | Status: AC
  Filled 2011-04-11: qty 1

## 2011-04-11 MED ORDER — ATROPINE SULFATE 0.4 MG/ML IJ SOLN
INTRAMUSCULAR | Status: DC | PRN
  Administered 2011-04-11: .8 mg via INTRAVENOUS

## 2011-04-11 MED ORDER — 0.9 % SODIUM CHLORIDE (POUR BTL) OPTIME
TOPICAL | Status: DC | PRN
  Administered 2011-04-11: 1000 mL

## 2011-04-11 MED ORDER — MIDAZOLAM HCL 5 MG/5ML IJ SOLN
INTRAMUSCULAR | Status: DC | PRN
  Administered 2011-04-11 (×2): 1 mg via INTRAVENOUS

## 2011-04-11 MED ORDER — SODIUM CHLORIDE 0.9 % IJ SOLN: 9.0000 mL | INTRAMUSCULAR | Status: DC | PRN

## 2011-04-11 MED ORDER — FENTANYL CITRATE 0.05 MG/ML IJ SOLN
25.0000 ug | INTRAMUSCULAR | Status: DC | PRN
  Administered 2011-04-11 (×2): 50 ug via INTRAVENOUS

## 2011-04-11 MED ORDER — HYDROMORPHONE HCL PF 1 MG/ML IJ SOLN
INTRAMUSCULAR | Status: AC
  Filled 2011-04-11: qty 1

## 2011-04-11 MED ORDER — HYDROMORPHONE 0.3 MG/ML IV SOLN
INTRAVENOUS | Status: AC
  Administered 2011-04-12: 0.3 mg
  Filled 2011-04-11: qty 25

## 2011-04-11 MED ORDER — DIPHENHYDRAMINE HCL 12.5 MG/5ML PO ELIX: 12.5000 mg | ORAL_SOLUTION | Freq: Four times a day (QID) | ORAL | Status: DC | PRN

## 2011-04-11 MED ORDER — GLYCOPYRROLATE 0.2 MG/ML IJ SOLN
INTRAMUSCULAR | Status: DC | PRN
  Administered 2011-04-11: 0.6 mg via INTRAVENOUS

## 2011-04-11 MED ORDER — PROMETHAZINE HCL 25 MG/ML IJ SOLN: 6.2500 mg | INTRAMUSCULAR | Status: DC | PRN

## 2011-04-11 MED ORDER — ROCURONIUM BROMIDE 100 MG/10ML IV SOLN
INTRAVENOUS | Status: DC | PRN
  Administered 2011-04-11: 10 mg via INTRAVENOUS
  Administered 2011-04-11: 50 mg via INTRAVENOUS
  Administered 2011-04-11 (×3): 10 mg via INTRAVENOUS

## 2011-04-11 MED ORDER — LIDOCAINE HCL (CARDIAC) 20 MG/ML IV SOLN
INTRAVENOUS | Status: DC | PRN
  Administered 2011-04-11: 100 mg via INTRAVENOUS

## 2011-04-11 MED ORDER — DIPHENHYDRAMINE HCL 50 MG/ML IJ SOLN: 12.5000 mg | Freq: Four times a day (QID) | INTRAMUSCULAR | Status: DC | PRN

## 2011-04-11 MED ORDER — NEOSTIGMINE METHYLSULFATE 1 MG/ML IJ SOLN
INTRAMUSCULAR | Status: DC | PRN
  Administered 2011-04-11: 4 mg via INTRAVENOUS

## 2011-04-11 MED ORDER — ALVIMOPAN 12 MG PO CAPS
ORAL_CAPSULE | ORAL | Status: AC
  Filled 2011-04-11: qty 1

## 2011-04-11 MED ORDER — HYDROMORPHONE HCL PF 1 MG/ML IJ SOLN
0.2500 mg | INTRAMUSCULAR | Status: DC | PRN
  Administered 2011-04-11 (×3): 0.5 mg via INTRAVENOUS

## 2011-04-11 MED ORDER — ONDANSETRON HCL 4 MG/2ML IJ SOLN
4.0000 mg | Freq: Four times a day (QID) | INTRAMUSCULAR | Status: DC | PRN
  Administered 2011-04-12: 4 mg via INTRAVENOUS

## 2011-04-11 MED ORDER — FENTANYL CITRATE 0.05 MG/ML IJ SOLN
INTRAMUSCULAR | Status: AC
  Filled 2011-04-11: qty 2

## 2011-04-11 MED ORDER — HYDROMORPHONE 0.3 MG/ML IV SOLN
INTRAVENOUS | Status: DC
  Administered 2011-04-11: 3.6 mg via INTRAVENOUS
  Administered 2011-04-11: 0.3 mg via INTRAVENOUS
  Administered 2011-04-12: 3.6 mg via INTRAVENOUS
  Administered 2011-04-12: 3.3 mg via INTRAVENOUS
  Administered 2011-04-12: 0.6 mg via INTRAVENOUS
  Administered 2011-04-12: 1.2 mg via INTRAVENOUS
  Administered 2011-04-12: 1.5 mg via INTRAVENOUS
  Administered 2011-04-12: 7.7 mg via INTRAVENOUS
  Administered 2011-04-12: 6.6 mg via INTRAVENOUS
  Administered 2011-04-13: 18:00:00 via INTRAVENOUS
  Administered 2011-04-13: 2.1 mg via INTRAVENOUS
  Administered 2011-04-13: 1.5 mg via INTRAVENOUS
  Administered 2011-04-13: 2.1 mg via INTRAVENOUS
  Administered 2011-04-13: 1.9 mg via INTRAVENOUS
  Administered 2011-04-13: 2.4 mg via INTRAVENOUS
  Administered 2011-04-13: 01:00:00 via INTRAVENOUS
  Administered 2011-04-14: 4.5 mg via INTRAVENOUS
  Administered 2011-04-14: 4.49 mg via INTRAVENOUS
  Administered 2011-04-14: 4.48 mg via INTRAVENOUS
  Administered 2011-04-14: 07:00:00 via INTRAVENOUS
  Administered 2011-04-14: 0.9 mg via INTRAVENOUS
  Administered 2011-04-14: 3.3 mg via INTRAVENOUS
  Administered 2011-04-15: 4.8 mg via INTRAVENOUS
  Administered 2011-04-15: 11.97 mg via INTRAVENOUS
  Administered 2011-04-15: 4.5 mg via INTRAVENOUS
  Administered 2011-04-15: 16:00:00 via INTRAVENOUS
  Administered 2011-04-15: 2.67 mg via INTRAVENOUS
  Administered 2011-04-15: 2.7 mg via INTRAVENOUS
  Administered 2011-04-16: 19:00:00 via INTRAVENOUS
  Administered 2011-04-16: 1.5 mg via INTRAVENOUS
  Administered 2011-04-16: 4.2 mg via INTRAVENOUS
  Administered 2011-04-16: 1.8 mg via INTRAVENOUS
  Administered 2011-04-16: 08:00:00 via INTRAVENOUS
  Administered 2011-04-16: 7.1 mg via INTRAVENOUS
  Administered 2011-04-16: 2.97 mg via INTRAVENOUS
  Administered 2011-04-17: 3.5 mg via INTRAVENOUS
  Administered 2011-04-17: 5.1 mg via INTRAVENOUS
  Administered 2011-04-17: via INTRAVENOUS
  Administered 2011-04-17: 2.39 mg via INTRAVENOUS
  Administered 2011-04-18: 07:00:00 via INTRAVENOUS
  Administered 2011-04-18: 3.28 mg via INTRAVENOUS

## 2011-04-11 MED ORDER — SODIUM CHLORIDE 0.9 % IV SOLN
1.0000 g | INTRAVENOUS | Status: AC
  Administered 2011-04-11: 1 g via INTRAVENOUS
  Filled 2011-04-11: qty 1

## 2011-04-11 MED ORDER — ALVIMOPAN 12 MG PO CAPS
12.0000 mg | ORAL_CAPSULE | Freq: Once | ORAL | Status: AC
  Administered 2011-04-11: 12 mg via ORAL

## 2011-04-11 MED ORDER — LACTATED RINGERS IV SOLN
INTRAVENOUS | Status: DC
  Administered 2011-04-11: 1000 mL via INTRAVENOUS
  Administered 2011-04-11: 15:00:00 via INTRAVENOUS

## 2011-04-11 MED ORDER — FENTANYL CITRATE 0.05 MG/ML IJ SOLN
INTRAMUSCULAR | Status: DC | PRN
  Administered 2011-04-11: 100 ug via INTRAVENOUS
  Administered 2011-04-11: 50 ug via INTRAVENOUS
  Administered 2011-04-11: 100 ug via INTRAVENOUS

## 2011-04-11 SURGICAL SUPPLY — 52 items
APPLICATOR COTTON TIP 6IN STRL (MISCELLANEOUS) IMPLANT
BLADE EXTENDED COATED 6.5IN (ELECTRODE) ×2 IMPLANT
BLADE HEX COATED 2.75 (ELECTRODE) ×2 IMPLANT
BLADE SURG SZ10 CARB STEEL (BLADE) IMPLANT
CANISTER SUCTION 2500CC (MISCELLANEOUS) IMPLANT
CLIP TI LARGE 6 (CLIP) ×2 IMPLANT
CLOTH BEACON ORANGE TIMEOUT ST (SAFETY) ×2 IMPLANT
COVER MAYO STAND STRL (DRAPES) ×4 IMPLANT
DRAPE LAPAROSCOPIC ABDOMINAL (DRAPES) ×2 IMPLANT
DRAPE LG THREE QUARTER DISP (DRAPES) ×2 IMPLANT
DRAPE WARM FLUID 44X44 (DRAPE) ×2 IMPLANT
ELECT REM PT RETURN 9FT ADLT (ELECTROSURGICAL) ×2
ELECTRODE REM PT RTRN 9FT ADLT (ELECTROSURGICAL) ×1 IMPLANT
EVACUATOR DRAINAGE 10X20 100CC (DRAIN) IMPLANT
EVACUATOR SILICONE 100CC (DRAIN)
GLOVE BIOGEL PI IND STRL 7.0 (GLOVE) IMPLANT
GLOVE BIOGEL PI INDICATOR 7.0 (GLOVE)
GLOVE SURG ORTHO 8.0 STRL STRW (GLOVE) ×6 IMPLANT
GOWN STRL NON-REIN LRG LVL3 (GOWN DISPOSABLE) ×2 IMPLANT
GOWN STRL REIN XL XLG (GOWN DISPOSABLE) ×4 IMPLANT
KIT BASIN OR (CUSTOM PROCEDURE TRAY) ×2 IMPLANT
LEGGING LITHOTOMY PAIR STRL (DRAPES) ×2 IMPLANT
LIGASURE IMPACT 36 18CM CVD LR (INSTRUMENTS) IMPLANT
NS IRRIG 1000ML POUR BTL (IV SOLUTION) ×4 IMPLANT
PACK GENERAL/GYN (CUSTOM PROCEDURE TRAY) ×2 IMPLANT
SCALPEL HARMONIC ACE (MISCELLANEOUS) IMPLANT
SEALER TISSUE X1 CVD JAW (INSTRUMENTS) IMPLANT
SPONGE GAUZE 4X4 12PLY (GAUZE/BANDAGES/DRESSINGS) ×2 IMPLANT
SPONGE LAP 18X18 X RAY DECT (DISPOSABLE) ×2 IMPLANT
STAPLER CIRC CVD 29MM 37CM (STAPLE) ×2 IMPLANT
STAPLER CUT CVD 40MM BLUE (STAPLE) ×2 IMPLANT
STAPLER VISISTAT 35W (STAPLE) ×2 IMPLANT
SUCTION POOLE TIP (SUCTIONS) ×2 IMPLANT
SUT ETHILON 3 0 PS 1 (SUTURE) IMPLANT
SUT NOV 1 T60/GS (SUTURE) IMPLANT
SUT NOVA 1 T20/GS 25DT (SUTURE) ×10 IMPLANT
SUT NOVA NAB DX-16 0-1 5-0 T12 (SUTURE) IMPLANT
SUT NOVA T20/GS 25 (SUTURE) IMPLANT
SUT PROLENE 2 0 BLUE (SUTURE) ×2 IMPLANT
SUT SILK 2 0 (SUTURE) ×1
SUT SILK 2 0 SH CR/8 (SUTURE) ×2 IMPLANT
SUT SILK 2 0SH CR/8 30 (SUTURE) IMPLANT
SUT SILK 2-0 18XBRD TIE 12 (SUTURE) ×1 IMPLANT
SUT SILK 2-0 30XBRD TIE 12 (SUTURE) IMPLANT
SUT SILK 3 0 (SUTURE) ×2
SUT SILK 3 0 SH CR/8 (SUTURE) ×2 IMPLANT
SUT SILK 3-0 18XBRD TIE 12 (SUTURE) ×2 IMPLANT
SUT VIC AB 4-0 SH 18 (SUTURE) ×2 IMPLANT
TAPE CLOTH SURG 4X10 WHT LF (GAUZE/BANDAGES/DRESSINGS) ×2 IMPLANT
TOWEL OR 17X26 10 PK STRL BLUE (TOWEL DISPOSABLE) ×6 IMPLANT
TRAY FOLEY CATH 14FRSI W/METER (CATHETERS) ×2 IMPLANT
YANKAUER SUCT BULB TIP NO VENT (SUCTIONS) ×4 IMPLANT

## 2011-04-11 NOTE — Interval H&P Note (Signed)
History and Physical Interval Note:  04/11/2011 1:04 PM  Taylor [name]  has presented today for surgery, with the diagnosis of  sigmoid colon cancer.  The various methods of treatment have been discussed with the patient and family. After consideration of risks, benefits and other options for treatment, the patient has consented to    Procedure(s) (LRB): COLON RESECTION SIGMOID (N/A) as a surgical intervention .    The patients' history has been reviewed, patient examined, no change in status, stable for surgery.  I have reviewed the patients' chart and labs.  Questions were answered to the patient's satisfaction.    Velora Heckler, MD, [facility] Office: [phone]   Alben Jepsen Judie Petit

## 2011-04-11 NOTE — H&P (View-Only) (Signed)
Chief Complaint  Patient presents with  . Colon Cancer    new pt- eval sigmoid colon cancer - referral from Dr. Marc Magod    HISTORY: Patient is a 56-year-old white male who had noted 3-4 weeks ago a significant change in his bowel movements. He noted that his bowel movements have become slow and more difficult. He denies any bleeding per rectum. He denies any recent changes in his weight. Patient developed some mild abdominal distention and presented to the emergency department. A CT scan of the abdomen and pelvis was performed on March 13. This showed thickening in the manner of the sigmoid colon with associated lymphadenopathy worrisome for adenocarcinoma. The patient was seen the next day by Dr. Mark Magod and underwent colonoscopy.  This showed a tumor at approximately 20 cm from the anus which was near obstructing. The scope was able to be passed proximally into the transverse colon. Findings were consistent with adenocarcinoma of the sigmoid colon. Patient is referred at this time for surgical assessment for resection.  Concurrently, the patient does have a right inguinal hernia which is mildly symptomatic. He has had no prior abdominal surgery. He has had no prior hernia repairs. There is no family history of colon cancer.  Past Medical History  Diagnosis Date  . Inguinal hernia   . Depression   . Colon tumor      Current Outpatient Prescriptions  Medication Sig Dispense Refill  . ARIPiprazole (ABILIFY) 10 MG tablet Take 10 mg by mouth daily.      . clonazePAM (KLONOPIN) 0.5 MG tablet Take 0.5 mg by mouth every morning.      . escitalopram (LEXAPRO) 20 MG tablet Take 20 mg by mouth daily.      . HYDROcodone-acetaminophen (VICODIN) 5-500 MG per tablet Take 1 tablet by mouth every 6 (six) hours as needed for pain.  10 tablet  0  . traZODone (DESYREL) 50 MG tablet Take 50 mg by mouth at bedtime.         Allergies  Allergen Reactions  . Codeine Nausea And Vomiting     Family  History  Problem Relation Age of Onset  . Heart disease Father      History   Social History  . Marital Status: Legally Separated    Spouse Name: N/A    Number of Children: N/A  . Years of Education: N/A   Social History Main Topics  . Smoking status: Current Everyday Smoker -- 1.0 packs/day  . Smokeless tobacco: None  . Alcohol Use: No  . Drug Use: No  . Sexually Active:    Other Topics Concern  . None   Social History Narrative  . None     REVIEW OF SYSTEMS - PERTINENT POSITIVES ONLY: Recent change in bowel habits with mild obstructive symptoms. No bleeding per rectum. No weight changes.  EXAM: Filed Vitals:   04/08/11 1649  BP: 120/82  Pulse: 80  Temp: 98.2 F (36.8 C)  Resp: 18    HEENT: normocephalic; pupils equal and reactive; sclerae clear; dentition good; mucous membranes moist NECK:  symmetric on extension; no palpable anterior or posterior cervical lymphadenopathy; no supraclavicular masses; no tenderness CHEST: clear to auscultation bilaterally without rales, rhonchi, or wheezes CARDIAC: regular rate and rhythm without significant murmur; peripheral pulses are full ABDOMEN: soft without distension; bowel sounds present; no mass; no hepatosplenomegaly; no hernia EXT:  non-tender without edema; no deformity NEURO: no gross focal deficits; no sign of tremor   LABORATORY RESULTS: See   [facility] (CHL-Epic) for most recent results   RADIOLOGY RESULTS: See [facility] (CHL-Epic) for most recent results   IMPRESSION: #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 I have recommended that he undergo exploratory laparotomy with sigmoid colectomy. I quoted him a risk of colostomy placement of less than 5%.  I have recommended that the patient proceed with surgery in the near future. I am  concerned about possible obstruction. I will offer him surgery dates later this week. Patient I discussed the hospital stay to be anticipated. We discussed potential complications including bleeding and infection. We discussed the postoperative recovery.  We discussed the need for consultation from medical oncology and the possible need for adjuvant chemotherapy or radiation therapy.  He understands and wishes to proceed with surgery in the near future.  The risks and benefits of the procedure have been discussed at length with the patient.  The patient understands the proposed procedure, potential alternative treatments, and the course of recovery to be expected.  All of the patient's questions have been answered at this time.  The patient wishes to proceed with surgery and will schedule a date for the procedure through our office staff.  Billie Intriago M. Armandina Iman, MD, FACS General & Endocrine Surgery [facility]   Visit Diagnoses: 1. Colon cancer, sigmoid     Primary Care Physician: ELMAHDY,WAGDY, MD, MD  GI:  Dr. Marc Magod 

## 2011-04-11 NOTE — Anesthesia Preprocedure Evaluation (Addendum)
Anesthesia Evaluation  Patient identified by MRN, date of birth, ID band Patient awake    Reviewed: Allergy & Precautions, H&P , NPO status , Patient's Chart, lab work & pertinent test results  Airway Mallampati: II TM Distance: >3 FB Neck ROM: Full    Dental No notable dental hx.    Pulmonary Current Smoker,  breath sounds clear to auscultation  Pulmonary exam normal       Cardiovascular negative cardio ROS  Rhythm:Regular Rate:Normal     Neuro/Psych Depression negative neurological ROS     GI/Hepatic negative GI ROS, Neg liver ROS,   Endo/Other  negative endocrine ROS  Renal/GU negative Renal ROS  negative genitourinary   Musculoskeletal negative musculoskeletal ROS (+)   Abdominal   Peds negative pediatric ROS (+)  Hematology negative hematology ROS (+)   Anesthesia Other Findings   Reproductive/Obstetrics negative OB ROS                           Anesthesia Physical Anesthesia Plan  ASA: II  Anesthesia Plan: General   Post-op Pain Management:    Induction: Intravenous  Airway Management Planned: Oral ETT  Additional Equipment:   Intra-op Plan:   Post-operative Plan: Extubation in OR  Informed Consent: I have reviewed the patients History and Physical, chart, labs and discussed the procedure including the risks, benefits and alternatives for the proposed anesthesia with the patient or authorized representative who has indicated his/her understanding and acceptance.   Dental advisory given  Plan Discussed with: CRNA  Anesthesia Plan Comments:         Anesthesia Quick Evaluation

## 2011-04-11 NOTE — Transfer of Care (Signed)
Immediate Anesthesia Transfer of Care Note  Patient: Taylor [name]  Procedure(s) Performed: Procedure(s) (LRB): COLON RESECTION SIGMOID (N/A)  Patient Location: PACU  Anesthesia Type: General  Level of Consciousness: awake, alert  and oriented  Airway & Oxygen Therapy: Patient Spontanous Breathing and Patient connected to face mask oxygen  Post-op Assessment: Report given to PACU RN and Post -op Vital signs reviewed and stable  Post vital signs: Reviewed and stable  Complications: No apparent anesthesia complications

## 2011-04-11 NOTE — Anesthesia Postprocedure Evaluation (Signed)
  Anesthesia Post-op Note  Patient: Taylor [name]  Procedure(s) Performed: Procedure(s) (LRB): COLON RESECTION SIGMOID (N/A)  Patient Location: PACU  Anesthesia Type: General  Level of Consciousness: awake and alert   Airway and Oxygen Therapy: Patient Spontanous Breathing  Post-op Pain: mild  Post-op Assessment: Post-op Vital signs reviewed, Patient's Cardiovascular Status Stable, Respiratory Function Stable, Patent Airway and No signs of Nausea or vomiting  Post-op Vital Signs: stable  Complications: No apparent anesthesia complications

## 2011-04-11 NOTE — Preoperative (Signed)
Beta Blockers   Reason not to administer Beta Blockers:Not applicable 

## 2011-04-11 NOTE — Progress Notes (Signed)
Completed bowel prep as directed by doctor 

## 2011-04-11 NOTE — Brief Op Note (Signed)
04/11/2011  3:44 PM  PATIENT:  Taylor [name]  56 y.o. male  PRE-OPERATIVE DIAGNOSIS:   sigmoid colon cancer  POST-OPERATIVE DIAGNOSIS:   Proximal rectal carcinoma  PROCEDURE:  Low anterior resection of rectal carcinoma  SURGEON:  Surgeon(s) and Role:    * Velora Heckler, MD - Primary    * Valarie Merino, MD - Assisting  ANESTHESIA:   general  EBL:  Total I/O In: 3000 [I.V.:3000] Out: 150 [Urine:150]  BLOOD ADMINISTERED:none  DRAINS: none   LOCAL MEDICATIONS USED:  NONE  SPECIMEN:  Excision  DISPOSITION OF SPECIMEN:  PATHOLOGY  COUNTS:  YES  TOURNIQUET:  * No tourniquets in log *  DICTATION: .Other Dictation: Dictation Number [id number]  PLAN OF CARE: Admit to inpatient   PATIENT DISPOSITION:  PACU - hemodynamically stable.   Delay start of Pharmacological VTE agent (>24hrs) due to surgical blood loss or risk of bleeding: yes  Velora Heckler, MD, [facility] Office: [phone]

## 2011-04-12 LAB — BASIC METABOLIC PANEL
CO2: 28 mEq/L (ref 19–32)
GFR calc non Af Amer: 80 mL/min — ABNORMAL LOW (ref >90)
Glucose, Bld: 117 mg/dL — ABNORMAL HIGH (ref 70–99)
Potassium: 4.3 mEq/L (ref 3.5–5.1)
Sodium: 136 mEq/L (ref 135–145)

## 2011-04-12 LAB — CBC
Hemoglobin: 14.2 g/dL (ref 13.0–17.0)
MCHC: 33.3 g/dL (ref 30.0–36.0)
RBC: 4.56 MIL/uL (ref 4.22–5.81)

## 2011-04-12 MED ORDER — HYDROMORPHONE 0.3 MG/ML IV SOLN
INTRAVENOUS | Status: AC
  Administered 2011-04-12: 08:00:00
  Filled 2011-04-12: qty 25

## 2011-04-12 MED ORDER — NICOTINE 21 MG/24HR TD PT24
21.0000 mg | MEDICATED_PATCH | Freq: Every day | TRANSDERMAL | Status: DC
  Administered 2011-04-12 – 2011-04-18 (×7): 21 mg via TRANSDERMAL
  Filled 2011-04-12 (×7): qty 1

## 2011-04-12 MED ORDER — PANTOPRAZOLE SODIUM 40 MG IV SOLR
40.0000 mg | Freq: Two times a day (BID) | INTRAVENOUS | Status: DC
  Administered 2011-04-12 – 2011-04-16 (×9): 40 mg via INTRAVENOUS
  Filled 2011-04-12 (×11): qty 40

## 2011-04-12 MED ORDER — ALUM & MAG HYDROXIDE-SIMETH 200-200-20 MG/5ML PO SUSP
30.0000 mL | ORAL | Status: DC | PRN
  Administered 2011-04-12: 30 mL via ORAL
  Filled 2011-04-12: qty 30

## 2011-04-12 MED ORDER — HYDROMORPHONE 0.3 MG/ML IV SOLN
INTRAVENOUS | Status: AC
  Filled 2011-04-12: qty 25

## 2011-04-12 NOTE — Progress Notes (Signed)
Pt request tums for heart burn contacted dr gerkin office to page regarding order for heart burn relief. Waiting on response. Annitta Needs, RN

## 2011-04-12 NOTE — Progress Notes (Signed)
MD gerkin provided verbal order for nicotine patch contacted pharmacy for dosing. 21 mg is ordered daily. Annitta Needs, RN

## 2011-04-12 NOTE — Op Note (Signed)
NAMEMarland Kitchen  KINGSON, LOHMEYER NO.:  [id number]  MEDICAL RECORD NO.:  [id number]  LOCATION:  1533                         FACILITY:  [facility]  PHYSICIAN:  Velora Heckler, MD      DATE OF BIRTH:  10-Apr-1955  DATE OF PROCEDURE:  04/11/2011                               OPERATIVE REPORT   PREOPERATIVE DIAGNOSIS:  Adenocarcinoma of the sigmoid colon.  POSTOPERATIVE DIAGNOSIS:  Adenocarcinoma of the proximal rectum.  PROCEDURE:  Low-anterior resection of rectal carcinoma.  SURGEON:  Velora Heckler, MD, FACS  ASSISTANT:  Thornton Park. Daphine Deutscher, MD, FACS  ANESTHESIA:  General.  ANESTHESIOLOGIST:  Hezzie Bump. Rose, M.D.  ESTIMATED BLOOD LOSS:  Minimal.  PREPARATION:  ChloraPrep and Betadine.  COMPLICATIONS:  None.  INDICATIONS:  The patient is a 56 year old male referred by Dr. Vida Rigger after colonoscopy demonstrated a near obstructing tumor at 20 cm from the anus.  Dr. Ewing Schlein was able to pass a scope through the tumor with some difficulty and examine the bowel up to the level of the splenic flexure.  Biopsies of the mass in the distal sigmoid colon, actually the proximal rectum showed adenocarcinoma.  The patient now comes to surgery for resection.  BODY OF REPORT:  Procedure was done in OR #12 at the [facility].  The patient was brought to the operating room and placed in a supine position on the operating room table.  Following administration of general anesthesia, the patient was positioned in low lithotomy and prepped and draped in the usual strict aseptic fashion. After ascertaining that an adequate level of anesthesia had been achieved, a low midline abdominal incision was made with a #10 blade. Dissection was carried through subcutaneous tissues.  Fascia was incised in the midline and the peritoneal cavity was entered cautiously. Abdomen was explored.  The liver was normal without evidence of metastatic disease.  The colon was palpated from the  cecum to the rectum.  No other mass lesions were identified on palpation.  Small bowel appears normal.  In the pelvis, there was a large tumor, somewhat fixed posteriorly at the pelvic brim.  The tumor appeared to be at the peritoneal reflection.  It was circumferential.  There appeared to be involved lymph nodes in the rectal mesentery.  There was a redundant loop of sigmoid colon.  A Balfour retractor was placed for good exposure.  A point in the distal sigmoid colon was selected and transected with a GIA stapler.  Mesentery was divided with a LigaSure. Peritoneum was incised laterally on both sides of the rectum down to the peritoneal reflection posterior to the bladder.  The rectum was gently mobilized.  The nodal packet was resected on block with the rectum using the LigaSure for hemostasis.  Dissection was carried down each side of the rectum laterally, and hemostasis obtained with the LigaSure. Dissection was carried distal to the tumor for approximately 7 to 8 cm. Bowel wall was then cleared circumferentially.  Using a Contour stapler, the bowel was transected distal to the tumor.  Sutures used to mark the distal margin.  The rectosigmoid specimen was then submitted to pathology.  Dr. Hollice Espy examined the specimen and stated that  there was at least a 10 cm proximal margin and a 5 cm distal margin present.  Next, the distal sigmoid colon was mobilized.  It was prepared for anastomosis by cleaning the bowel wall circumferentially.  Staple line was excised.  A 2-0 Prolene pursestring suture was placed.  Bowel was then sized with sizers and a 29-mm Ethicon EEA stapler was selected. The anvil was inserted into the distal end of the sigmoid colon, and the 2-0 Prolene pursestring suture was tied securely.  From below, digital rectal exam was performed.  The EEA stapler was prepared and placed through the anus into the rectum and advanced to the contour staple line.  Trocar was  advanced and staple was reassembled, closed, and fired.  Two complete donuts were obtained.  The distal donut at the rectal margin was submitted separately for pathologic review.  Rigid sigmoidoscopy was performed.  The anastomosis appeared to be intact circumferentially.  There was minimal bleeding.  The scope was easily driven through the anastomosis and into the distal sigmoid colon.  Air was insufflated under pressure and with saline in the pelvis, there was no evidence of leakage at the anastomosis.  Air was evacuated and the scope was withdrawn from the rectum.  Gloves and gown were changed.  The pelvis was irrigated copiously with warm saline, which was evacuated.  Good hemostasis was noted throughout.  All packs were removed and the Balfour retractor was removed.  Counts were correct.  Omentum was used to cover the small bowel.  Midline abdominal incision was closed with interrupted #1 Novafil simple sutures. Subcutaneous tissues were irrigated.  Skin was closed with stainless steel staples.  Sterile dressings were applied.  The patient was awakened from anesthesia and brought to the recovery room.  The patient tolerated the procedure well.   Velora Heckler, MD, FACS  TMG/MEDQ  D:  04/11/2011  T:  04/12/2011  Job:  [id number]  cc:   Petra Kuba, M.D. Fax: [phone]

## 2011-04-12 NOTE — Progress Notes (Signed)
MD Paged concering patient's request for nicotine patch Means, Taylor [name] N 04-12-11 10:07am

## 2011-04-12 NOTE — Progress Notes (Signed)
Patient ID: Taylor [name], male   DOB: 1956/01/20, 56 y.o.   MRN: [id number]  General Surgery - [facility] - Progress Note  POD# 1  Subjective: Patient on [address].  Complains of acid reflux, abdominal distension, and nausea.  Only out of bed once today.  Sister at bedside.  Objective: Vital signs in last 24 hours: Temp:  [97.8 F (36.6 C)-98.5 F (36.9 C)] 98.1 F (36.7 C) (03/22 1400) Pulse Rate:  [81-93] 87  (03/22 1400) Resp:  [11-21] 17  (03/22 1546) BP: (107-136)/(70-85) 136/73 mmHg (03/22 1400) SpO2:  [92 %-98 %] 94 % (03/22 1546) Last BM Date: 04/11/11  Intake/Output from previous day: 03/21 0701 - 03/22 0700 In: 5000 [I.V.:5000] Out: 725 [Urine:650; Blood:75]  Exam: HEENT - clear, not icteric Neck - soft Chest - clear bilaterally Cor - RRR, no murmur Abd - markedly distended; no BS; dressing dry Ext - no significant edema Neuro - grossly intact, no focal deficits  Lab Results:   Basename 04/12/11 0428 04/11/11 1747  WBC 12.2* 15.3*  HGB 14.2 15.2  HCT 42.7 45.7  PLT 236 242     Basename 04/12/11 0428 04/11/11 1747 04/10/11 0900  NA 136 -- 138  K 4.3 -- 4.4  CL 101 -- 100  CO2 28 -- 32  GLUCOSE 117* -- 99  BUN 12 -- 10  CREATININE 1.02 1.02 --  CALCIUM 8.4 -- 9.3    Studies/Results: No results found.  Assessment: Status post LAR for rectal carcinoma Post op ileus with abdominal distension  Plan: Will place NG tube to suction now PPI by IV for reflux Discontinue Entereg Check labs in AM Await path results   Velora Heckler, MD, [facility] Office: [phone]  04/12/2011

## 2011-04-13 LAB — BASIC METABOLIC PANEL
CO2: 33 mEq/L — ABNORMAL HIGH (ref 19–32)
Calcium: 8.9 mg/dL (ref 8.4–10.5)
Chloride: 97 mEq/L (ref 96–112)
Glucose, Bld: 145 mg/dL — ABNORMAL HIGH (ref 70–99)
Sodium: 135 mEq/L (ref 135–145)

## 2011-04-13 LAB — CBC
Hemoglobin: 14.2 g/dL (ref 13.0–17.0)
MCH: 30.9 pg (ref 26.0–34.0)
RBC: 4.6 MIL/uL (ref 4.22–5.81)
WBC: 13.8 10*3/uL — ABNORMAL HIGH (ref 4.0–10.5)

## 2011-04-13 MED ORDER — HYDROMORPHONE 0.3 MG/ML IV SOLN
INTRAVENOUS | Status: AC
  Filled 2011-04-13: qty 25

## 2011-04-13 NOTE — Progress Notes (Signed)
Removed patient foley. Annitta Needs, RN

## 2011-04-13 NOTE — Progress Notes (Signed)
Re inserted NG tube to right nare. Previous NG tube coiled and patient began to smell stomach content. Pt tolerated well. Ausculation of placement confirmed. Clear fluid.  Annitta Needs, RN

## 2011-04-13 NOTE — Progress Notes (Signed)
Patient ID: Taylor [name], male   DOB: January 22, 1955, 56 y.o.   MRN: [id number]  General Surgery - [facility] - Progress Note  POD# 2  Subjective: Patient now with NG tube in place.  Nausea resolved.  Moderate pain - on PCA.  Foley coming out this AM.  Objective: Vital signs in last 24 hours: Temp:  [98.1 F (36.7 C)-99.2 F (37.3 C)] 98.9 F (37.2 C) (03/23 0622) Pulse Rate:  [87-91] 90  (03/23 0622) Resp:  [11-19] 13  (03/23 0753) BP: (113-149)/(73-89) 127/79 mmHg (03/23 0622) SpO2:  [90 %-99 %] 90 % (03/23 0753) Last BM Date: 04/11/11  Intake/Output from previous day: 03/22 0701 - 03/23 0700 In: -  Out: 2100 [Urine:1250; Emesis/NG output:850]  Exam: HEENT - clear, not icteric Neck - soft Chest - clear bilaterally Cor - RRR, no murmur Abd - mod distension; no BS; dressing dry Ext - no significant edema Neuro - grossly intact, no focal deficits  Lab Results:   Basename 04/13/11 0457 04/12/11 0428  WBC 13.8* 12.2*  HGB 14.2 14.2  HCT 43.2 42.7  PLT 217 236     Basename 04/13/11 0457 04/12/11 0428  NA 135 136  K 4.1 4.3  CL 97 101  CO2 33* 28  GLUCOSE 145* 117*  BUN 10 12  CREATININE 0.90 1.02  CALCIUM 8.9 8.4    Studies/Results: No results found.  Assessment: LAR for rectal Ca - path pending Post op ileus - NG placed  Plan: Ambulate in halls NPO, IVF Await resolution of ileus Await path results   Velora Heckler, MD, [facility] Office: [phone]  04/13/2011

## 2011-04-14 MED ORDER — HYDROMORPHONE 0.3 MG/ML IV SOLN
INTRAVENOUS | Status: AC
  Filled 2011-04-14: qty 25

## 2011-04-14 MED ORDER — SODIUM CHLORIDE 0.9 % IV BOLUS (SEPSIS)
1000.0000 mL | Freq: Once | INTRAVENOUS | Status: AC
  Administered 2011-04-14: 1000 mL via INTRAVENOUS

## 2011-04-14 MED ORDER — HYDROMORPHONE 0.3 MG/ML IV SOLN
INTRAVENOUS | Status: AC
  Administered 2011-04-15: 4.48 mg
  Filled 2011-04-14: qty 25

## 2011-04-14 MED ORDER — HYDROMORPHONE 0.3 MG/ML IV SOLN
INTRAVENOUS | Status: AC
  Administered 2011-04-14: 13:00:00
  Administered 2011-04-14: 3.6 mg
  Filled 2011-04-14: qty 25

## 2011-04-14 NOTE — Progress Notes (Signed)
Pt complaining of feeling dehydrated and dizzy. Pt stated he is hot and sweating with room on 55 degrees. Will notify physician. Annitta Needs, RN

## 2011-04-14 NOTE — Progress Notes (Signed)
Patient ID: Taylor [name], male   DOB: Apr 09, 1955, 56 y.o.   MRN: [id number]  General Surgery - [facility] - Progress Note  POD# 3  Subjective: Patient more comfortable since NG replaced.  Less distended.  Ambulated in halls.  No flatus. No BM.  Objective: Vital signs in last 24 hours: Temp:  [97.7 F (36.5 C)-98.8 F (37.1 C)] 98.4 F (36.9 C) (03/24 0554) Pulse Rate:  [72-82] 72  (03/24 0554) Resp:  [13-25] 21  (03/24 0554) BP: (118-147)/(71-85) 118/71 mmHg (03/24 0554) SpO2:  [90 %-100 %] 95 % (03/24 0554) Last BM Date: 04/11/11  Intake/Output from previous day: 03/23 0701 - 03/24 0700 In: 2426 [I.V.:2365; NG/GT:60] Out: 1575 [Urine:1575]  Exam: HEENT - clear, not icteric Neck - soft Chest - clear bilaterally Cor - RRR, no murmur Abd - mod distension; no BS; wound clear and dry Ext - no significant edema Neuro - grossly intact, no focal deficits  Lab Results:   Basename 04/13/11 0457 04/12/11 0428  WBC 13.8* 12.2*  HGB 14.2 14.2  HCT 43.2 42.7  PLT 217 236     Basename 04/13/11 0457 04/12/11 0428  NA 135 136  K 4.1 4.3  CL 97 101  CO2 33* 28  GLUCOSE 145* 117*  BUN 10 12  CREATININE 0.90 1.02  CALCIUM 8.9 8.4    Studies/Results: No results found.  Assessment: LAR for rectal carcinoma  Plan: Await resolution of ileus Await path report OOB, ambulate in halls NG to low suction IVF, NPO except ice chips Check labs in AM   Velora Heckler, MD, [facility] Office: [phone]  04/14/2011

## 2011-04-14 NOTE — Plan of Care (Signed)
Problem: Phase II Progression Outcomes Goal: Return of bowel function (flatus, BM) IF ABDOMINAL SURGERY:  Outcome: Progressing Audible bowel sounds pt stated feels like needs bowel movement

## 2011-04-15 LAB — CBC
HCT: 40.6 % (ref 39.0–52.0)
Hemoglobin: 13.5 g/dL (ref 13.0–17.0)
MCH: 31.1 pg (ref 26.0–34.0)
MCV: 93.5 fL (ref 78.0–100.0)
Platelets: 239 10*3/uL (ref 150–400)
RBC: 4.34 MIL/uL (ref 4.22–5.81)

## 2011-04-15 LAB — BASIC METABOLIC PANEL
BUN: 8 mg/dL (ref 6–23)
CO2: 33 mEq/L — ABNORMAL HIGH (ref 19–32)
Calcium: 9 mg/dL (ref 8.4–10.5)
Chloride: 101 mEq/L (ref 96–112)
Creatinine, Ser: 0.88 mg/dL (ref 0.50–1.35)
Glucose, Bld: 102 mg/dL — ABNORMAL HIGH (ref 70–99)

## 2011-04-15 MED ORDER — HYDROMORPHONE 0.3 MG/ML IV SOLN
INTRAVENOUS | Status: AC
  Filled 2011-04-15: qty 25

## 2011-04-15 MED ORDER — HYDROMORPHONE 0.3 MG/ML IV SOLN
INTRAVENOUS | Status: AC
  Administered 2011-04-15: 08:00:00
  Filled 2011-04-15: qty 25

## 2011-04-15 NOTE — Progress Notes (Signed)
Patient ID: Taylor [name], male   DOB: 06-Mar-1955, 56 y.o.   MRN: [id number]  General Surgery - [facility] - Progress Note  POD# 4  Subjective: Patient more comfortable.  Notes small flatus once.  NG in place.  Objective: Vital signs in last 24 hours: Temp:  [97.4 F (36.3 C)-98.6 F (37 C)] 98.6 F (37 C) (03/25 0438) Pulse Rate:  [68-78] 78  (03/25 0438) Resp:  [14-20] 17  (03/25 0743) BP: (108-134)/(73-80) 134/80 mmHg (03/25 0438) SpO2:  [91 %-100 %] 95 % (03/25 0743) Last BM Date: 04/11/11  Intake/Output from previous day: 03/24 0701 - 03/25 0700 In: 2462 [I.V.:2430; NG/GT:30] Out: 3225 [Urine:2250; Emesis/NG output:975]  Exam: HEENT - clear, not icteric Neck - soft Chest - clear bilaterally Cor - RRR, no murmur Abd - softer, less distended; wound with slight erythema on left; no drainage; few BS Ext - no significant edema Neuro - grossly intact, no focal deficits  Lab Results:   Basename 04/15/11 0427 04/13/11 0457  WBC 8.1 13.8*  HGB 13.5 14.2  HCT 40.6 43.2  PLT 239 217     Basename 04/15/11 0427 04/13/11 0457  NA 138 135  K 4.3 4.1  CL 101 97  CO2 33* 33*  GLUCOSE 102* 145*  BUN 8 10  CREATININE 0.88 0.90  CALCIUM 9.0 8.9    Studies/Results: No results found.  Assessment: LAR for rectal carcinoma - path pending Post op ileus - ? early resolution  Plan: Continue NPO, ice, IVF, NG decompression OOB, ambulate in halls Await path results, then medical oncology consultation   Velora Heckler, MD, [facility] Office: [phone]  04/15/2011

## 2011-04-16 MED ORDER — HYDROMORPHONE 0.3 MG/ML IV SOLN
INTRAVENOUS | Status: AC
  Filled 2011-04-16: qty 25

## 2011-04-16 NOTE — Progress Notes (Signed)
Patient ID: Taylor [name], male   DOB: Mar 09, 1955, 56 y.o.   MRN: [id number]  General Surgery - [facility] - Progress Note  POD# 5  Subjective: Patient without complaints.  Ambulatory.  Passed flatus 3 times per pt.  Objective: Vital signs in last 24 hours: Temp:  [98.1 F (36.7 C)-99 F (37.2 C)] 99 F (37.2 C) (03/26 0549) Pulse Rate:  [71-77] 77  (03/26 0549) Resp:  [13-29] 18  (03/26 0549) BP: (127-137)/(75-88) 133/76 mmHg (03/26 0549) SpO2:  [94 %-98 %] 96 % (03/26 0549) Last BM Date: 04/11/11  Intake/Output from previous day: 03/25 0701 - 03/26 0700 In: -  Out: 3846 [Urine:1345; Emesis/NG output:2501]  Exam: HEENT - clear, not icteric Neck - soft Chest - clear bilaterally Cor - RRR, no murmur Abd - softer, mild distension; BS present; NG output less, thin; minimally tender Ext - no significant edema Neuro - grossly intact, no focal deficits  Lab Results:   [facility] 04/15/11 0427  WBC 8.1  HGB 13.5  HCT 40.6  PLT 239     Basename 04/15/11 0427  NA 138  K 4.3  CL 101  CO2 33*  GLUCOSE 102*  BUN 8  CREATININE 0.88  CALCIUM 9.0    Studies/Results: No results found.  Assessment: Status post LAR Resolving ileus  Plan: Begin Clear liquid diet Discontinue NG tube OOB, ambulate Medical oncology consult - will call today   Velora Heckler, MD, [facility] Office: [phone]  04/16/2011

## 2011-04-17 ENCOUNTER — Telehealth: Payer: Self-pay | Admitting: Oncology

## 2011-04-17 MED ORDER — HYDROMORPHONE 0.3 MG/ML IV SOLN
INTRAVENOUS | Status: AC
  Administered 2011-04-17: 16:00:00
  Filled 2011-04-17: qty 25

## 2011-04-17 MED ORDER — PANTOPRAZOLE SODIUM 40 MG PO TBEC
40.0000 mg | DELAYED_RELEASE_TABLET | Freq: Two times a day (BID) | ORAL | Status: DC
  Administered 2011-04-17 – 2011-04-18 (×3): 40 mg via ORAL
  Filled 2011-04-17 (×4): qty 1

## 2011-04-17 MED ORDER — HYDROMORPHONE 0.3 MG/ML IV SOLN
INTRAVENOUS | Status: AC
  Filled 2011-04-17: qty 25

## 2011-04-17 MED ORDER — HYDROMORPHONE 0.3 MG/ML IV SOLN
INTRAVENOUS | Status: AC
  Administered 2011-04-17: 08:00:00
  Filled 2011-04-17: qty 25

## 2011-04-17 NOTE — Progress Notes (Signed)
Pharmacy IV to PO  The patient is receiving Protonix by the intravenous route.  Based on criteria approved by the Pharmacy and Therapeutics Committee and the Medical Executive Committee, the medication is being converted to the equivalent oral dose form.  These criteria include: -No Active GI bleeding -Able to tolerate diet of full liquids (or better) or tube feeding -Able to tolerate other medications by the oral or enteral route  If you have any questions about this conversion, please contact the Pharmacy Department (ext [phone]).  Thank you.  Butler Vegh, Loma Messing PharmD 8:11 AM 04/17/2011

## 2011-04-17 NOTE — Telephone Encounter (Signed)
called pts home s/w mother and she stated that pt is in the hospital suppose to be discharged sometime this week.  will call pt on 04/01 to schedule appt

## 2011-04-17 NOTE — Progress Notes (Signed)
Patient ID: Taylor [name], male   DOB: [birth year], 56 y.o.   MRN: [id number]  General Surgery - [facility] - Progress Note  POD# 6  Subjective: Patient doing well.  Tolerated clear liquids.  Wants to eat solid food.  Passing flatus.  Objective: Vital signs in last 24 hours: Temp:  [98.2 F (36.8 C)-99 F (37.2 C)] 98.2 F (36.8 C) (03/27 0604) Pulse Rate:  [72-80] 80  (03/27 0604) Resp:  [14-22] 20  (03/27 0740) BP: (99-135)/(82-88) 99/86 mmHg (03/27 0604) SpO2:  [93 %-99 %] 96 % (03/27 0740) Last BM Date: 04/11/11  Intake/Output from previous day: 03/26 0701 - 03/27 0700 In: 563.8 [I.V.:563.8] Out: 3575 [Urine:3575]  Exam: HEENT - clear, not icteric Neck - soft Chest - clear bilaterally Cor - RRR, no murmur Abd - softer, minimal distension; active BS; wound clear and dry Ext - no significant edema Neuro - grossly intact, no focal deficits  Lab Results:   [facility] 04/15/11 0427  WBC 8.1  HGB 13.5  HCT 40.6  PLT 239     Basename 04/15/11 0427  NA 138  K 4.3  CL 101  CO2 33*  GLUCOSE 102*  BUN 8  CREATININE 0.88  CALCIUM 9.0    Studies/Results: No results found.  Assessment: Status post LAR for rectal ca Resolving ileus  Plan: Advance to regular diet OOB, ambulate Oncology to see Possibly home tomorrow if diet tolerated   Velora Heckler, MD, [facility] Office: [phone]  04/17/2011

## 2011-04-18 ENCOUNTER — Telehealth: Payer: Self-pay | Admitting: *Deleted

## 2011-04-18 LAB — CREATININE, SERUM: Creatinine, Ser: 0.91 mg/dL (ref 0.50–1.35)

## 2011-04-18 MED ORDER — HYDROCODONE-ACETAMINOPHEN 5-325 MG PO TABS
1.0000 | ORAL_TABLET | Freq: Four times a day (QID) | ORAL | Status: DC | PRN
  Administered 2011-04-18: 2 via ORAL
  Filled 2011-04-18: qty 2

## 2011-04-18 MED ORDER — HYDROCODONE-ACETAMINOPHEN 10-325 MG PO TABS: 1.0000 | ORAL_TABLET | ORAL | Status: AC | PRN

## 2011-04-18 MED ORDER — HYDROMORPHONE 0.3 MG/ML IV SOLN
INTRAVENOUS | Status: AC
  Filled 2011-04-18: qty 25

## 2011-04-18 NOTE — Telephone Encounter (Signed)
Rec'd referral from Dr Truett Perna from Referring MD Dr Nat Christen for colon cancer. Patient to be discharged today, notified patient that we would be calling him at home to confirm appt to return to our office in about 2 weeks. Patient confirmed contact numbers of [phone] or [phone].

## 2011-04-18 NOTE — Progress Notes (Signed)
Order ecieved from MD Gerkin for norco for pain, will give medication and see how patient tolerates in before discharge Means, Debralee Braaksma N 04-18-11 10:24am

## 2011-04-18 NOTE — Discharge Summary (Signed)
Physician Discharge Summary [facility]  Patient ID: Taylor [name] MRN: [id number] DOB/AGE: December 21, 1955 56 y.o.  Admit date: 04/11/2011 Discharge date: 04/18/2011  Admission Diagnoses:  Adenocarcinoma of the rectosigmoid colon  Discharge Diagnoses:  Active Problems:  * No active hospital problems. *    Discharged Condition: good  Hospital Course: patient admitted on day of surgery.  Underwent low anterior resection of large upper rectal tumor at peritoneal reflection.  Primary anastomosis.  No intra-operative sign of distal metastatic disease.  Pathology with nodal metastases.  Post op course complicated by ileus requiring NG decompression and IV hydration.  Slow progress to clear liquid diet.  Now tolerating regular diet and passing flatus.  Prepared for discharge home.  Consults: medical oncology consultation arranged with Dr. Mancel Bale who made initial visit to patient today  Significant Diagnostic Studies: none   Treatments: surgery: as above  Discharge Exam: Blood pressure 111/77, pulse 75, temperature 98.6 F (37 C), temperature source Oral, resp. rate 18, height 5\' 11"  (1.803 m), weight 186 lb (84.369 kg), SpO2 93.00%. HEENT - clear Neck - no mass Chest - clear Cor - RRR Abd - incision clear and dry; BS active; no sign infection Ext - no edema Neuro - intact  Disposition: Home with family today  Discharge Orders    Future Orders Please Complete By Expires   Diet - low sodium heart healthy      Increase activity slowly      Discharge instructions      Comments:   DISCHARGE INSTRUCTIONS TO PATIENT  REMINDER:  Carry a list of your medications and allergies with you at all times Call your pharmacy at least 1 week in advance to refill prescriptions Do not mix any prescribed pain medicine with alcohol Do not drive any motor vehicles while taking pain medication Take medications with food unless otherwise directed  Follow-up  appointments (date to return to physician): Please call [phone] to confirm your follow up appointment with your surgeon.  Call your Surgeon if you have: Temperature greater than 100.4 Persistent nausea and vomiting Severe uncontrolled pain Redness, tenderness, or signs of infection (pain, swelling, redness, odor or green/yellow discharge around the site) Difficulty breathing, headache or visual disturbances Hives Persistent dizziness or light-headedness Any other questions or concerns you may have after discharge  In an emergency, call 911 or go to an Emergency Department at a nearby hospital.   Diet: Begin with liquids, and if they are tolerated, resume your usual diet.  Avoid spicy, greasy or heavy foods.  If you have nausea or vomiting, go back to liquids.  If you cannot keep liquids down, call your doctor.  Avoid alcohol consumption while on prescription pain medications. Good nutrition promotes healing. Increase fiber and fluids.   ADDITIONAL INSTRUCTIONS: May shower.  No lifting more than 10 lbs for two weeks. Return to [facility] office for staple removal next week. tmg  Velora Heckler, MD, [facility] Office: [phone]   I understand and acknowledge receipt of the above instructions.  Patient or Guardian Signature                                                               Date/Time                                                                                                                                        R.N.'s Signature                                                                    Date/Time     No dressing needed        Medication List  As of 04/18/2011  7:58 AM   STOP taking these medications         HYDROcodone-acetaminophen 5-500 MG per tablet         TAKE these medications          ARIPiprazole 10 MG tablet   Commonly known as: ABILIFY   Take 10 mg by mouth every morning.      clonazePAM 0.5 MG tablet   Commonly known as: KLONOPIN   Take 0.5 mg by mouth every morning.      escitalopram 20 MG tablet   Commonly known as: LEXAPRO   Take 20 mg by mouth every morning.      HYDROcodone-acetaminophen 10-325 MG per tablet   Commonly known as: NORCO   Take 1-2 tablets by mouth every 4 (four) hours as needed for pain.      traZODone 50 MG tablet   Commonly known as: DESYREL   Take 50 mg by mouth at bedtime.           Velora Heckler, MD, [name] Office: [phone]    Signed: Velora Heckler 04/18/2011, 7:58 AM

## 2011-04-19 ENCOUNTER — Encounter (HOSPITAL_COMMUNITY): Payer: Self-pay | Admitting: Surgery

## 2011-04-19 ENCOUNTER — Telehealth: Payer: Self-pay | Admitting: Oncology

## 2011-04-19 NOTE — Telephone Encounter (Signed)
called pt and informed him of appt on 04/11 faxed over a letter to Dr. Gerrit Friends with appt d/t

## 2011-04-24 ENCOUNTER — Telehealth: Payer: Self-pay | Admitting: Oncology

## 2011-04-24 ENCOUNTER — Encounter (INDEPENDENT_AMBULATORY_CARE_PROVIDER_SITE_OTHER): Payer: Self-pay | Admitting: Surgery

## 2011-04-24 ENCOUNTER — Ambulatory Visit (INDEPENDENT_AMBULATORY_CARE_PROVIDER_SITE_OTHER): Payer: Medicaid Other | Admitting: Surgery

## 2011-04-24 VITALS — BP 114/72 | HR 86 | Temp 97.4°F | Resp 16 | Ht 71.0 in | Wt 184.2 lb

## 2011-04-24 DIAGNOSIS — C189 Malignant neoplasm of colon, unspecified: Secondary | ICD-10-CM

## 2011-04-24 DIAGNOSIS — K4020 Bilateral inguinal hernia, without obstruction or gangrene, not specified as recurrent: Secondary | ICD-10-CM | POA: Insufficient documentation

## 2011-04-24 DIAGNOSIS — K4090 Unilateral inguinal hernia, without obstruction or gangrene, not specified as recurrent: Secondary | ICD-10-CM

## 2011-04-24 NOTE — Telephone Encounter (Signed)
received a call from Gavin Pound from [facility] that pt is having port placed on 04/11 and we need to r/s new pt appt .  r/s appt to 04/19. called pt and informed him of appt

## 2011-04-24 NOTE — Progress Notes (Signed)
Visit Diagnoses: 1. Inguinal hernia unilateral, non-recurrent   2. Colon cancer, sigmoid     HISTORY: Patient returns for first postoperative visit having undergone low anterior resection of colorectal carcinoma. Postoperative course has been uneventful. He is tolerating a regular diet. He is scheduled to see his medical oncologist in followup next week.  I have discussed his case with his medical oncologist. They will need an infusion port to facilitate chemotherapy. We will make arrangements for this to be placed as an outpatient procedure in the near future.  EXAM: Abdomen is soft nontender without distention. Midline surgical incision has healed nicely. Staples were removed and benzoin and Steri-Strips are applied today in the office.  IMPRESSION: #1 colorectal carcinoma, status post low anterior resection #2 right inguinal hernia, reducible, mildly symptomatic  PLAN: The patient is instructed in local wound care. We will schedule him for infusion port placement as an outpatient surgical procedure in the near future. Patient will be seen at the cancer Center in one week for further evaluation.  Once his course of treatment is better defined, we will make arrangements for him to have his right inguinal hernia repaired as an outpatient procedure.  The risks and benefits of the procedure have been discussed at length with the patient.  The patient understands the proposed procedure, potential alternative treatments, and the course of recovery to be expected.  All of the patient's questions have been answered at this time.  The patient wishes to proceed with surgery.  Velora Heckler, MD, FACS General & Endocrine Surgery [facility]

## 2011-04-24 NOTE — Patient Instructions (Signed)
Leave steristrips one week.  Then remove and begin to apply cocoa butter cream.  COCOA BUTTER & VITAMIN E CREAM  ([employer] or other brand)  Apply cocoa butter/vitamin E cream to your incision 2 - 3 times daily.  Massage cream into incision for one minute with each application.  Use sunscreen (50 SPF or higher) for first 6 months after surgery if area is exposed to sun.  You may substitute Mederma or other scar reducing creams as desired.

## 2011-04-26 NOTE — Discharge Instructions (Signed)
[address]  04/26/2011   Your procedure is scheduled on:  05/02/11  Report to SHORT STAY DEPT  at 10:30 AM.  Call this number if you have problems the morning of surgery: [phone]   Remember:   Do not eat food or drink liquids AFTER MIDNIGHT  May have clear liquids UNTIL 6 HOURS BEFORE SURGERY (7:00 am)  Clear liquids include soda, tea, black coffee, apple or grape juice, broth.  Take these medicines the morning of surgery with A SIP OF WATER: abilify / clonazepam / lexapro / hydrocodone if needed   Do not wear jewelry, make-up or nail polish.  Do not wear lotions, powders, or perfumes.   Do not shave legs or underarms 48 hrs. before surgery (men may shave face)  Do not bring valuables to the hospital.  Contacts, dentures or bridgework may not be worn into surgery.  Leave suitcase in the car. After surgery it may be brought to your room.  For patients admitted to the hospital, checkout time is 11:00 AM the day of discharge.   Patients discharged the day of surgery will not be allowed to drive home.  Name and phone number of your driver:   Special Instructions:   Please read over the following fact sheets that you were given: MRSA  Information               SHOWER WITH BETASEPT THE NIGHT BEFORE SURGERY AND THE MORNING OF SURGERY

## 2011-05-01 ENCOUNTER — Telehealth: Payer: Self-pay | Admitting: Oncology

## 2011-05-01 NOTE — Telephone Encounter (Signed)
Referred by Dr. Todd Gerkin Dx- Colon Ca °

## 2011-05-02 ENCOUNTER — Ambulatory Visit (HOSPITAL_COMMUNITY): Payer: Medicaid Other | Admitting: Anesthesiology

## 2011-05-02 ENCOUNTER — Encounter (HOSPITAL_COMMUNITY): Payer: Self-pay | Admitting: *Deleted

## 2011-05-02 ENCOUNTER — Encounter (HOSPITAL_COMMUNITY)
Admission: RE | Disposition: A | Discharge status: Home / Self Care | Payer: Self-pay | Source: Ambulatory Visit | Attending: Surgery

## 2011-05-02 ENCOUNTER — Ambulatory Visit (HOSPITAL_COMMUNITY): Payer: Medicaid Other

## 2011-05-02 ENCOUNTER — Encounter (HOSPITAL_COMMUNITY): Payer: Self-pay | Admitting: Anesthesiology

## 2011-05-02 ENCOUNTER — Ambulatory Visit (HOSPITAL_COMMUNITY)
Admission: RE | Admit: 2011-05-02 | Discharge: 2011-05-02 | Disposition: A | Discharge status: Home / Self Care | Payer: Medicaid Other | Source: Ambulatory Visit | Attending: Surgery | Admitting: Surgery

## 2011-05-02 ENCOUNTER — Ambulatory Visit: Payer: Medicaid Other

## 2011-05-02 ENCOUNTER — Ambulatory Visit: Payer: Medicaid Other | Admitting: Oncology

## 2011-05-02 DIAGNOSIS — F3289 Other specified depressive episodes: Secondary | ICD-10-CM | POA: Insufficient documentation

## 2011-05-02 DIAGNOSIS — K4090 Unilateral inguinal hernia, without obstruction or gangrene, not specified as recurrent: Secondary | ICD-10-CM | POA: Insufficient documentation

## 2011-05-02 DIAGNOSIS — C189 Malignant neoplasm of colon, unspecified: Secondary | ICD-10-CM | POA: Insufficient documentation

## 2011-05-02 DIAGNOSIS — Z79899 Other long term (current) drug therapy: Secondary | ICD-10-CM | POA: Insufficient documentation

## 2011-05-02 DIAGNOSIS — F17200 Nicotine dependence, unspecified, uncomplicated: Secondary | ICD-10-CM | POA: Insufficient documentation

## 2011-05-02 DIAGNOSIS — F329 Major depressive disorder, single episode, unspecified: Secondary | ICD-10-CM | POA: Insufficient documentation

## 2011-05-02 HISTORY — PX: PORTACATH PLACEMENT: SHX2246

## 2011-05-02 LAB — SURGICAL PCR SCREEN
MRSA, PCR: NEGATIVE
Staphylococcus aureus: NEGATIVE

## 2011-05-02 SURGERY — INSERTION, TUNNELED CENTRAL VENOUS DEVICE, WITH PORT
Anesthesia: Monitor Anesthesia Care | Site: Chest | Wound class: Clean

## 2011-05-02 MED ORDER — LACTATED RINGERS IV SOLN
INTRAVENOUS | Status: DC
  Administered 2011-05-02: 1000 mL via INTRAVENOUS

## 2011-05-02 MED ORDER — FENTANYL CITRATE 0.05 MG/ML IJ SOLN: 25.0000 ug | INTRAMUSCULAR | Status: DC | PRN

## 2011-05-02 MED ORDER — LACTATED RINGERS IV SOLN: INTRAVENOUS | Status: DC

## 2011-05-02 MED ORDER — CEFAZOLIN SODIUM-DEXTROSE 2-3 GM-% IV SOLR
2.0000 g | INTRAVENOUS | Status: AC
  Administered 2011-05-02: 2 g via INTRAVENOUS

## 2011-05-02 MED ORDER — DROPERIDOL 2.5 MG/ML IJ SOLN
INTRAMUSCULAR | Status: DC | PRN
  Administered 2011-05-02: .5 mg via INTRAVENOUS

## 2011-05-02 MED ORDER — HYDROCODONE-ACETAMINOPHEN 10-325 MG PO TABS: 1.0000 | ORAL_TABLET | ORAL | Status: AC | PRN

## 2011-05-02 MED ORDER — KETAMINE HCL 10 MG/ML IJ SOLN
INTRAMUSCULAR | Status: DC | PRN
  Administered 2011-05-02: 10 mg via INTRAVENOUS

## 2011-05-02 MED ORDER — LIDOCAINE HCL (PF) 1 % IJ SOLN
INTRAMUSCULAR | Status: DC | PRN
  Administered 2011-05-02: 15 mL via SUBCUTANEOUS

## 2011-05-02 MED ORDER — HEPARIN SODIUM (PORCINE) 5000 UNIT/ML IJ SOLN
INTRAMUSCULAR | Status: DC | PRN
  Administered 2011-05-02: 13:00:00

## 2011-05-02 MED ORDER — MUPIROCIN 2 % EX OINT
TOPICAL_OINTMENT | CUTANEOUS | Status: AC
  Filled 2011-05-02: qty 22

## 2011-05-02 MED ORDER — LACTATED RINGERS IV SOLN
INTRAVENOUS | Status: DC | PRN
  Administered 2011-05-02 (×2): via INTRAVENOUS

## 2011-05-02 MED ORDER — ACETAMINOPHEN 10 MG/ML IV SOLN
INTRAVENOUS | Status: AC
  Filled 2011-05-02: qty 100

## 2011-05-02 MED ORDER — MIDAZOLAM HCL 5 MG/5ML IJ SOLN
INTRAMUSCULAR | Status: DC | PRN
  Administered 2011-05-02: 2 mg via INTRAVENOUS

## 2011-05-02 MED ORDER — ONDANSETRON HCL 4 MG/2ML IJ SOLN
INTRAMUSCULAR | Status: DC | PRN
  Administered 2011-05-02 (×2): 2 mg via INTRAVENOUS

## 2011-05-02 MED ORDER — LIDOCAINE HCL 1 % IJ SOLN
INTRAMUSCULAR | Status: AC
  Filled 2011-05-02: qty 40

## 2011-05-02 MED ORDER — HEPARIN SOD (PORK) LOCK FLUSH 100 UNIT/ML IV SOLN
INTRAVENOUS | Status: AC
  Filled 2011-05-02: qty 5

## 2011-05-02 MED ORDER — SODIUM CHLORIDE 0.9 % IR SOLN
Status: DC | PRN
  Administered 2011-05-02: 1000 mL

## 2011-05-02 MED ORDER — ACETAMINOPHEN 10 MG/ML IV SOLN
INTRAVENOUS | Status: DC | PRN
  Administered 2011-05-02: 1000 mg via INTRAVENOUS

## 2011-05-02 MED ORDER — SODIUM CHLORIDE 0.9 % IR SOLN
Freq: Once | Status: DC
  Filled 2011-05-02: qty 1.2

## 2011-05-02 MED ORDER — HEPARIN SOD (PORK) LOCK FLUSH 100 UNIT/ML IV SOLN
INTRAVENOUS | Status: DC | PRN
  Administered 2011-05-02: 400 [IU] via INTRAVENOUS

## 2011-05-02 MED ORDER — HYDROCODONE-ACETAMINOPHEN 5-325 MG PO TABS: 1.0000 | ORAL_TABLET | ORAL | Status: DC | PRN

## 2011-05-02 MED ORDER — FENTANYL CITRATE 0.05 MG/ML IJ SOLN
INTRAMUSCULAR | Status: DC | PRN
  Administered 2011-05-02 (×3): 50 ug via INTRAVENOUS

## 2011-05-02 MED ORDER — PROMETHAZINE HCL 25 MG/ML IJ SOLN: 6.2500 mg | INTRAMUSCULAR | Status: DC | PRN

## 2011-05-02 MED ORDER — CEFAZOLIN SODIUM-DEXTROSE 2-3 GM-% IV SOLR
INTRAVENOUS | Status: AC
  Filled 2011-05-02: qty 50

## 2011-05-02 MED ORDER — PROPOFOL 10 MG/ML IV EMUL
INTRAVENOUS | Status: DC | PRN
  Administered 2011-05-02: 50 ug/kg/min via INTRAVENOUS

## 2011-05-02 SURGICAL SUPPLY — 38 items
BAG DECANTER FOR FLEXI CONT (MISCELLANEOUS) ×2 IMPLANT
BENZOIN TINCTURE PRP APPL 2/3 (GAUZE/BANDAGES/DRESSINGS) ×2 IMPLANT
BLADE SURG 15 STRL LF DISP TIS (BLADE) ×1 IMPLANT
BLADE SURG 15 STRL SS (BLADE) ×1
BLADE SURG SZ10 CARB STEEL (BLADE) ×2 IMPLANT
CLOTH BEACON ORANGE TIMEOUT ST (SAFETY) ×2 IMPLANT
CLSR STERI-STRIP ANTIMIC 1/2X4 (GAUZE/BANDAGES/DRESSINGS) ×2 IMPLANT
DECANTER SPIKE VIAL GLASS SM (MISCELLANEOUS) ×2 IMPLANT
DRAPE C-ARM 42X72 X-RAY (DRAPES) ×2 IMPLANT
DRAPE LAPAROTOMY TRNSV 102X78 (DRAPE) ×2 IMPLANT
ELECT REM PT RETURN 9FT ADLT (ELECTROSURGICAL) ×2
ELECTRODE REM PT RTRN 9FT ADLT (ELECTROSURGICAL) ×1 IMPLANT
GAUZE SPONGE 4X4 16PLY XRAY LF (GAUZE/BANDAGES/DRESSINGS) ×2 IMPLANT
GLOVE BIOGEL PI IND STRL 7.0 (GLOVE) ×1 IMPLANT
GLOVE BIOGEL PI INDICATOR 7.0 (GLOVE) ×1
GLOVE SURG ORTHO 8.0 STRL STRW (GLOVE) ×2 IMPLANT
GOWN STRL NON-REIN LRG LVL3 (GOWN DISPOSABLE) ×2 IMPLANT
GOWN STRL REIN XL XLG (GOWN DISPOSABLE) ×4 IMPLANT
KIT BARDPORT ISP (Port) IMPLANT
KIT BARDPORT ISP 9.6FR (PORTABLE EQUIPMENT SUPPLIES) IMPLANT
KIT BASIN OR (CUSTOM PROCEDURE TRAY) ×2 IMPLANT
KIT POWER CATH 8FR (Catheter) ×2 IMPLANT
NEEDLE HYPO 25X1 1.5 SAFETY (NEEDLE) ×2 IMPLANT
NS IRRIG 1000ML POUR BTL (IV SOLUTION) ×2 IMPLANT
PACK BASIC VI WITH GOWN DISP (CUSTOM PROCEDURE TRAY) ×2 IMPLANT
PEN SKIN MARKING BROAD (MISCELLANEOUS) ×2 IMPLANT
PENCIL BUTTON HOLSTER BLD 10FT (ELECTRODE) ×2 IMPLANT
SPONGE GAUZE 4X4 12PLY (GAUZE/BANDAGES/DRESSINGS) ×2 IMPLANT
STRIP CLOSURE SKIN 1/4X4 (GAUZE/BANDAGES/DRESSINGS) ×2 IMPLANT
SUT PROLENE 2 0 CT2 30 (SUTURE) ×2 IMPLANT
SUT VIC AB 3-0 SH 27 (SUTURE) ×1
SUT VIC AB 3-0 SH 27XBRD (SUTURE) ×1 IMPLANT
SUT VIC AB 4-0 PS2 27 (SUTURE) ×2 IMPLANT
SYR CONTROL 10ML LL (SYRINGE) ×4 IMPLANT
SYRINGE 10CC LL (SYRINGE) ×2 IMPLANT
TAPE CLOTH SURG 4X10 WHT LF (GAUZE/BANDAGES/DRESSINGS) ×2 IMPLANT
TOWEL OR 17X26 10 PK STRL BLUE (TOWEL DISPOSABLE) ×2 IMPLANT
WATER STERILE IRR 1500ML POUR (IV SOLUTION) ×2 IMPLANT

## 2011-05-02 NOTE — Transfer of Care (Signed)
Immediate Anesthesia Transfer of Care Note  Patient: Taylor [name]  Procedure(s) Performed: Procedure(s) (LRB): INSERTION PORT-A-CATH (N/A)  Patient Location: PACU  Anesthesia Type: MAC  Level of Consciousness: awake  Airway & Oxygen Therapy: Patient Spontanous Breathing  Post-op Assessment: Report given to PACU RN and Post -op Vital signs reviewed and stable  Post vital signs: Reviewed and stable  Complications: No apparent anesthesia complications

## 2011-05-02 NOTE — Anesthesia Postprocedure Evaluation (Signed)
Anesthesia Post Note  Patient: Taylor [name]  Procedure(s) Performed: Procedure(s) (LRB): INSERTION PORT-A-CATH (N/A)  Anesthesia type: MAC  Patient location: PACU  Post pain: Pain level controlled  Post assessment: Post-op Vital signs reviewed  Last Vitals:  Filed Vitals:   05/02/11 1445  BP:   Pulse:   Temp: 36.8 C  Resp:     Post vital signs: Reviewed  Level of consciousness: sedated  Complications: No apparent anesthesia complications

## 2011-05-02 NOTE — H&P (View-Only) (Signed)
Chief Complaint  Patient presents with  . Colon Cancer    new pt- eval sigmoid colon cancer - referral from Dr. Vida Rigger    HISTORY: Patient is a 56 year old white male who had noted 3-4 weeks ago a significant change in his bowel movements. He noted that his bowel movements have become slow and more difficult. He denies any bleeding per rectum. He denies any recent changes in his weight. Patient developed some mild abdominal distention and presented to the emergency department. A CT scan of the abdomen and pelvis was performed on March 13. This showed thickening in the manner of the sigmoid colon with associated lymphadenopathy worrisome for adenocarcinoma. The patient was seen the next day by Dr. Leary Roca and underwent colonoscopy.  This showed a tumor at approximately 20 cm from the anus which was near obstructing. The scope was able to be passed proximally into the transverse colon. Findings were consistent with adenocarcinoma of the sigmoid colon. Patient is referred at this time for surgical assessment for resection.  Concurrently, the patient does have a right inguinal hernia which is mildly symptomatic. He has had no prior abdominal surgery. He has had no prior hernia repairs. There is no family history of colon cancer.  Past Medical History  Diagnosis Date  . Inguinal hernia   . Depression   . Colon tumor      Current Outpatient Prescriptions  Medication Sig Dispense Refill  . ARIPiprazole (ABILIFY) 10 MG tablet Take 10 mg by mouth daily.      . clonazePAM (KLONOPIN) 0.5 MG tablet Take 0.5 mg by mouth every morning.      . escitalopram (LEXAPRO) 20 MG tablet Take 20 mg by mouth daily.      Marland Kitchen HYDROcodone-acetaminophen (VICODIN) 5-500 MG per tablet Take 1 tablet by mouth every 6 (six) hours as needed for pain.  10 tablet  0  . traZODone (DESYREL) 50 MG tablet Take 50 mg by mouth at bedtime.         Allergies  Allergen Reactions  . Codeine Nausea And Vomiting     Family  History  Problem Relation Age of Onset  . Heart disease Father      History   Social History  . Marital Status: Legally Separated    Spouse Name: N/A    Number of Children: N/A  . Years of Education: N/A   Social History Main Topics  . Smoking status: Current Everyday Smoker -- 1.0 packs/day  . Smokeless tobacco: None  . Alcohol Use: No  . Drug Use: No  . Sexually Active:    Other Topics Concern  . None   Social History Narrative  . None     REVIEW OF SYSTEMS - PERTINENT POSITIVES ONLY: Recent change in bowel habits with mild obstructive symptoms. No bleeding per rectum. No weight changes.  EXAM: Filed Vitals:   04/08/11 1649  BP: 120/82  Pulse: 80  Temp: 98.2 F (36.8 C)  Resp: 18    HEENT: normocephalic; pupils equal and reactive; sclerae clear; dentition good; mucous membranes moist NECK:  symmetric on extension; no palpable anterior or posterior cervical lymphadenopathy; no supraclavicular masses; no tenderness CHEST: clear to auscultation bilaterally without rales, rhonchi, or wheezes CARDIAC: regular rate and rhythm without significant murmur; peripheral pulses are full ABDOMEN: soft without distension; bowel sounds present; no mass; no hepatosplenomegaly; no hernia EXT:  non-tender without edema; no deformity NEURO: no gross focal deficits; no sign of tremor   LABORATORY RESULTS: See  [facility] (CHL-Epic) for most recent results   RADIOLOGY RESULTS: See [facility] (CHL-Epic) for most recent results   IMPRESSION: #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 I have recommended that he undergo exploratory laparotomy with sigmoid colectomy. I quoted him a risk of colostomy placement of less than 5%.  I have recommended that the patient proceed with surgery in the near future. I am  concerned about possible obstruction. I will offer him surgery dates later this week. Patient I discussed the hospital stay to be anticipated. We discussed potential complications including bleeding and infection. We discussed the postoperative recovery.  We discussed the need for consultation from medical oncology and the possible need for adjuvant chemotherapy or radiation therapy.  He understands and wishes to proceed with surgery in the near future.  The risks and benefits of the procedure have been discussed at length with the patient.  The patient understands the proposed procedure, potential alternative treatments, and the course of recovery to be expected.  All of the patient's questions have been answered at this time.  The patient wishes to proceed with surgery and will schedule a date for the procedure through our office staff.  Velora Heckler, MD, FACS General & Endocrine Surgery [facility]   Visit Diagnoses: 1. Colon cancer, sigmoid     Primary Care Physician: Lynne Leader, MD, MD  GI:  Dr. Vida Rigger

## 2011-05-02 NOTE — Transfer of Care (Signed)
Immediate Anesthesia Transfer of Care Note  Patient: Taylor [name]  Procedure(s) Performed: Procedure(s) (LRB): INSERTION PORT-A-CATH (N/A)  Patient Location: PACU  Anesthesia Type: MAC combined with regional for post-op pain  Level of Consciousness: awake  Airway & Oxygen Therapy: Patient Spontanous Breathing  Post-op Assessment: Report given to PACU RN and Post -op Vital signs reviewed and stable  Post vital signs: Reviewed and stable  Complications: No apparent anesthesia complications

## 2011-05-02 NOTE — Interval H&P Note (Signed)
History and Physical Interval Note:  05/02/2011 11:39 AM  Taylor [name]  has presented today for surgery, with the diagnosis of colon cancer.  The various methods of treatment have been discussed with the patient and family. After consideration of risks, benefits and other options for treatment, the patient has consented to    Procedure(s) (LRB): INSERTION PORT-A-CATH (N/A) as a surgical intervention .    The patients' history has been reviewed, patient examined, no change in status, stable for surgery.  I have reviewed the patients' chart and labs.  Questions were answered to the patient's satisfaction.    Velora Heckler, MD, [facility] Office: [phone]    [name] Judie Petit

## 2011-05-02 NOTE — Brief Op Note (Signed)
05/02/2011  1:36 PM  PATIENT:  Taylor [name]  56 y.o. male  PRE-OPERATIVE DIAGNOSIS:  colon cancer  POST-OPERATIVE DIAGNOSIS:  colon cancer  PROCEDURE:  Procedure(s) (LRB): INSERTION PORT-A-CATH (N/A)  SURGEON:  Surgeon(s) and Role:    * Velora Heckler, MD - Primary  ANESTHESIA:   IV sedation  EBL:  Total I/O In: 1200 [I.V.:1200] Out: -   BLOOD ADMINISTERED:none  DEVICES: Bard Power Port, single lumen   LOCAL MEDICATIONS USED:  MARCAINE     SPECIMEN:  No Specimen  DISPOSITION OF SPECIMEN:  N/A  COUNTS:  YES  TOURNIQUET:  * No tourniquets in log *  DICTATION: .Other Dictation: Dictation Number [id number]  PLAN OF CARE: Discharge to home after PACU  PATIENT DISPOSITION:  PACU - hemodynamically stable.   Delay start of Pharmacological VTE agent (>24hrs) due to surgical blood loss or risk of bleeding: yes  Velora Heckler, MD, [facility] Office: [phone]

## 2011-05-02 NOTE — Anesthesia Preprocedure Evaluation (Addendum)
Anesthesia Evaluation  Patient identified by MRN, date of birth, ID band Patient awake    Reviewed: Allergy & Precautions, H&P , NPO status , Patient's Chart, lab work & pertinent test results  History of Anesthesia Complications Negative for: history of anesthetic complications  Airway Mallampati: II TM Distance: >3 FB Neck ROM: Full    Dental  (+) Partial Lower and Partial Upper   Pulmonary Current Smoker,  breath sounds clear to auscultation        Cardiovascular negative cardio ROS  Rhythm:Regular Rate:Normal     Neuro/Psych PSYCHIATRIC DISORDERS Depression negative neurological ROS     GI/Hepatic Neg liver ROS, Neoplasm colon   Endo/Other  negative endocrine ROS  Renal/GU negative Renal ROS  negative genitourinary   Musculoskeletal negative musculoskeletal ROS (+)   Abdominal   Peds  Hematology negative hematology ROS (+)   Anesthesia Other Findings   Reproductive/Obstetrics negative OB ROS                          Anesthesia Physical Anesthesia Plan  ASA: III  Anesthesia Plan: MAC   Post-op Pain Management:    Induction: Intravenous  Airway Management Planned: Simple Face Mask  Additional Equipment:   Intra-op Plan:   Post-operative Plan:   Informed Consent: I have reviewed the patients History and Physical, chart, labs and discussed the procedure including the risks, benefits and alternatives for the proposed anesthesia with the patient or authorized representative who has indicated his/her understanding and acceptance.   Dental advisory given  Plan Discussed with: CRNA  Anesthesia Plan Comments:         Anesthesia Quick Evaluation                                  Anesthesia Evaluation  Patient identified by MRN, date of birth, ID band Patient awake    Reviewed: Allergy & Precautions, H&P , NPO status , Patient's Chart, lab work & pertinent test  results  Airway Mallampati: II TM Distance: >3 FB Neck ROM: Full    Dental No notable dental hx.    Pulmonary Current Smoker,  breath sounds clear to auscultation  Pulmonary exam normal       Cardiovascular negative cardio ROS  Rhythm:Regular Rate:Normal     Neuro/Psych Depression negative neurological ROS     GI/Hepatic negative GI ROS, Neg liver ROS,   Endo/Other  negative endocrine ROS  Renal/GU negative Renal ROS  negative genitourinary   Musculoskeletal negative musculoskeletal ROS (+)   Abdominal   Peds negative pediatric ROS (+)  Hematology negative hematology ROS (+)   Anesthesia Other Findings   Reproductive/Obstetrics negative OB ROS                           Anesthesia Physical Anesthesia Plan  ASA: II  Anesthesia Plan: General   Post-op Pain Management:    Induction: Intravenous  Airway Management Planned: Oral ETT  Additional Equipment:   Intra-op Plan:   Post-operative Plan: Extubation in OR  Informed Consent: I have reviewed the patients History and Physical, chart, labs and discussed the procedure including the risks, benefits and alternatives for the proposed anesthesia with the patient or authorized representative who has indicated his/her understanding and acceptance.   Dental advisory given  Plan Discussed with: CRNA  Anesthesia Plan Comments:  Anesthesia Quick Evaluation                                    Anesthesia Evaluation  Patient identified by MRN, date of birth, ID band Patient awake    Reviewed: Allergy & Precautions, H&P , NPO status , Patient's Chart, lab work & pertinent test results  Airway Mallampati: II TM Distance: >3 FB Neck ROM: Full    Dental No notable dental hx.    Pulmonary Current Smoker,  breath sounds clear to auscultation  Pulmonary exam normal       Cardiovascular negative cardio ROS  Rhythm:Regular Rate:Normal      Neuro/Psych Depression negative neurological ROS     GI/Hepatic negative GI ROS, Neg liver ROS,   Endo/Other  negative endocrine ROS  Renal/GU negative Renal ROS  negative genitourinary   Musculoskeletal negative musculoskeletal ROS (+)   Abdominal   Peds negative pediatric ROS (+)  Hematology negative hematology ROS (+)   Anesthesia Other Findings   Reproductive/Obstetrics negative OB ROS                           Anesthesia Physical Anesthesia Plan  ASA: II  Anesthesia Plan: General   Post-op Pain Management:    Induction: Intravenous  Airway Management Planned: Oral ETT  Additional Equipment:   Intra-op Plan:   Post-operative Plan: Extubation in OR  Informed Consent: I have reviewed the patients History and Physical, chart, labs and discussed the procedure including the risks, benefits and alternatives for the proposed anesthesia with the patient or authorized representative who has indicated his/her understanding and acceptance.   Dental advisory given  Plan Discussed with: CRNA  Anesthesia Plan Comments:         Anesthesia Quick Evaluation

## 2011-05-03 NOTE — Op Note (Signed)
NAMEMarland Kitchen  Taylor [name], Taylor [name] NO.:  [id number]  MEDICAL RECORD NO.:  [id number]  LOCATION:  [facility]                         FACILITY:  [facility]  PHYSICIAN:  Velora Heckler, MD      DATE OF BIRTH:  May 13, 1955  DATE OF PROCEDURE:  05/02/2011                              OPERATIVE REPORT   PREOPERATIVE DIAGNOSIS:  Adenocarcinoma of the colon.  POSTOPERATIVE DIAGNOSIS:  Adenocarcinoma of the colon.  PROCEDURE:  Placement, left subclavian vein infusion port.  SURGEON:  Velora Heckler, MD, FACS  ANESTHESIA:  Local with intravenous sedation.  PREPARATION:  ChloraPrep.  COMPLICATIONS:  None.  INDICATIONS:  The patient is a 57 year old white male, who recently underwent low anterior resection for rectosigmoid adenocarcinoma.  The patient is being evaluated by Medical Oncology.  He will require adjuvant chemotherapy.  A chronic venous access device is requested.  BODY OF REPORT:  Procedures done in OR #6 at the [facility].  The patient was brought to the operating room, placed in supine position on the operating room table.  Following administration of intravenous sedation, the patient was positioned and then prepped and draped in usual strict aseptic fashion.  After ascertaining that an adequate level of sedation had been achieved, the patient was placed in Trendelenburg.  Skin beneath the left clavicle was anesthetized with local anesthetic.  This was infiltrated into the subcutaneous tissues to the depth of the left clavicle.  Using an 18-gauge Seldinger needle, the left subclavian vein was entered in a single pass.  A soft J-shaped guidewire was inserted and the needle was removed.  C-arm fluoroscopy was used to confirm the positioning of the guidewire.  Next, a spot on the left chest wall was anesthetized with local anesthetic.  A 2 cm incision was made with #15 blade.  Dissection was carried through subcutaneous tissues and a subcutaneous pocket  was created to accommodate a single-lumen power port.  Port was assembled. It was flushed with heparinized saline.  The catheter was then tunneled from the port site to the site of guidewire placement using a tendon passer.  Catheter was trimmed to 24 cm in length.  Port was positioned in the subcutaneous space and secured to the chest wall with interrupted 2-0 Prolene sutures.  Port was flushed with heparinized saline.  Using a dilator and peel-away sleeve, the left subclavian vein was accessed. Guidewire was removed.  Dilator was removed.  Catheter was then advanced through the peel-away sleeve into position and the peel-away sleeve was removed.  C-arm fluoroscopy confirmed catheter placement in the superior vena cava.  Port aspirates blood easily and flushes easily with heparinized saline.  Port was flushed with 5 mL of 100 unit/cc heparin.  Subcutaneous tissues were closed with interrupted 3-0 Vicryl sutures. Skin was closed with a running 4-0 Vicryl subcuticular suture.  Wounds were washed and dried, and benzoin and Steri-Strips were applied. Sterile dressings were applied.  The patient was transported to the recovery room.  A portable chest x-ray will be obtained in the recovery room to document the positioning of the catheter.  The patient tolerated the procedure well.   Velora Heckler, MD, FACS   TMG/MEDQ  D:  05/02/2011  T:  05/03/2011  Job:  [id number]  cc:   Petra Kuba, M.D. Fax: [phone]  Ladene Artist, M.D. Fax: [phone]

## 2011-05-09 ENCOUNTER — Telehealth (INDEPENDENT_AMBULATORY_CARE_PROVIDER_SITE_OTHER): Payer: Self-pay

## 2011-05-09 NOTE — Telephone Encounter (Signed)
04/11/2011 colon cancer surgey

## 2011-05-09 NOTE — Telephone Encounter (Signed)
Colon cancer surgery

## 2011-05-09 NOTE — Telephone Encounter (Signed)
Dr. Gerrit Friends ok'd Hydrocodone 10/325 #30 w/ no refills which was called in to [facility].  Pt is aware.

## 2011-05-09 NOTE — Telephone Encounter (Signed)
Pt called today requesting pain medicine.  He is having pain at his [facility] site and hernia repair.  He said the Hydrocodone 5/325 did not help.  Please advise.  He may be reached at [phone].

## 2011-05-10 ENCOUNTER — Encounter: Payer: Self-pay | Admitting: Oncology

## 2011-05-10 ENCOUNTER — Telehealth: Payer: Self-pay | Admitting: Oncology

## 2011-05-10 ENCOUNTER — Ambulatory Visit: Payer: Medicaid Other

## 2011-05-10 ENCOUNTER — Encounter: Payer: Self-pay | Admitting: *Deleted

## 2011-05-10 ENCOUNTER — Ambulatory Visit (HOSPITAL_BASED_OUTPATIENT_CLINIC_OR_DEPARTMENT_OTHER): Payer: Medicaid Other | Admitting: Oncology

## 2011-05-10 VITALS — BP 124/78 | HR 89 | Temp 97.0°F | Ht 70.0 in | Wt 188.2 lb

## 2011-05-10 DIAGNOSIS — C189 Malignant neoplasm of colon, unspecified: Secondary | ICD-10-CM

## 2011-05-10 DIAGNOSIS — K4090 Unilateral inguinal hernia, without obstruction or gangrene, not specified as recurrent: Secondary | ICD-10-CM

## 2011-05-10 DIAGNOSIS — F319 Bipolar disorder, unspecified: Secondary | ICD-10-CM

## 2011-05-10 DIAGNOSIS — C19 Malignant neoplasm of rectosigmoid junction: Secondary | ICD-10-CM

## 2011-05-10 NOTE — Progress Notes (Signed)
Referring MD: Taylor [name] 56 y.o.  Jun 08, 1955    Reason for Referral: Rectosigmoid cancer     HPI: He reports an approximate one-month history of progressive abdominal distention and pain. He presented to the emergency room on 04/03/2011. A CT of the abdomen and pelvis without IV contrast revealed an unremarkable liver and adrenal glands. No hydronephrosis. Asymmetric sigmoid colonic wall thickening was noted and a 1.9 cm soft tissue attenuation of butted the sigmoid colon wall. Additional subcentimeter regional lymph nodes were seen. No bowel obstruction. No free intraperitoneal air or fluid.  He was referred to [facility] and underwent a colonoscopy procedure on 04/04/2011. A benign-appearing semi-sessile polyp was found in the rectum. An ulcerated partially obstructing large mass was found in the distal sigmoid colon at 20 cm. The mass was circumferential. Diverticula were found in the sigmoid colon and in the descending colon. A polyp was removed from the splenic flexure.  He was referred to Dr. Gerrit [name] and was taken to the operating room on 04/11/2011 for a low anterior resection. The liver appeared normal. The small bowel appeared normal. A large tumor was noted in the pelvis at the peritoneal reflection. There appeared to be involved lymph nodes and the rectal mesentery. A primary resection was performed with adequate margins.  The pathology (Taylor [name]) confirmed an invasive well-differentiated adenocarcinoma measuring 5 cm. A separate focus of adenocarcinoma was present in the mesentery measuring 2.5 cm. Lymphovascular invasion was identified. Perineural invasion was identified. Metastatic adenocarcinoma was seen in 7 of 19 lymph nodes. The surgical resection margins were negative. Tumor extended into the pericolonic soft tissue.  He has recovered from surgery. He underwent placement of a Port-A-Cath by TaylorGerkin on 05/02/2011.    Past Medical History  Diagnosis  Date  . Inguinal hernia   .    Marland Kitchen Cancer (T3 N2 B.)   04/11/2011     adenocarcinoma of colon   . Depression     Bipolar disorder    Past Surgical History  Procedure Date  .  low anterior resection  04/11/2011    Procedure: COLON RESECTION SIGMOID;  Surgeon: Taylor Heckler, MD;  Location: [facility];  Service: General;  Laterality: N/A;  low anterior colon resection   . Colon surgery 04/11/11    Sigmoid colectomy  . Portacath placement  05/02/2011     Family History  Problem Relation Age of Onset  . Heart disease Taylor [name]    . 3 brothers and 2 sisters. One daughter. No family history of cancer.  Current outpatient prescriptions:ARIPiprazole (ABILIFY) 10 MG tablet, Take 10 mg by mouth every morning. , Disp: , Rfl: ;  clonazePAM (KLONOPIN) 0.5 MG tablet, Take 0.5 mg by mouth every morning., Disp: , Rfl: ;  escitalopram (LEXAPRO) 20 MG tablet, Take 20 mg by mouth every morning. , Disp: , Rfl: ;  HYDROcodone-acetaminophen (NORCO) 10-325 MG per tablet, Take 1-2 tablets by mouth every 4 (four) hours as needed for pain., Disp: 30 tablet, Rfl: 0 traZODone (DESYREL) 50 MG tablet, Take 50 mg by mouth at bedtime., Disp: , Rfl:   Allergies:  Allergies  Allergen Reactions  . Codeine Nausea And Vomiting    Social History:   He lives alone in [name]. He is disabled secondary to bipolar disease. He smokes cigarettes. He drinks alcohol intermittently. No transfusion history. He denies risk factors for HIV and hepatitis. He was in [name] in the 1970s.  History  Alcohol Use  . Yes  occasional     History  Smoking status  . Current Everyday Smoker -- 1.0 packs/day for 38 years  Smokeless tobacco  . Former User     ROS:   Positives include: Abdominal bloating, diarrhea, and pain for one month prior to surgery. Fullness in the right inguinal area.  A complete ROS was otherwise negative.  Physical Exam:  Blood pressure 124/78, pulse 89, temperature 97 F (36.1 C), temperature source  Oral, height 5\' 10"  (1.778 m), weight 188 lb 3.2 oz (85.367 kg).  HEENT: Upper and lower denture plate, oropharynx without mass, neck without mass Lungs: Distant breath sounds, clear, no respiratory distress Cardiac: Regular rate and rhythm Abdomen: Healed midline incision, no mass, no hepatosplenomegaly GU: Testes without mass , reducible right inguinal hernia Vascular: No leg edema Lymph nodes: No cervical, supraclavicular, axillary, or inguinal nodes Neurologic: Alert and oriented, the motor examination appears grossly intact Skin: No rash, left upper chest Port-A-Cath site without evidence of infection   LAB:  CBC  Lab Results  Component Value Date   WBC 8.1 04/15/2011   HGB 13.5 04/15/2011   HCT 40.6 04/15/2011   MCV 93.5 04/15/2011   PLT 239 04/15/2011     CMP      Component Value Date/Time   NA 138 04/15/2011 0427   K 4.3 04/15/2011 0427   CL 101 04/15/2011 0427   CO2 33* 04/15/2011 0427   GLUCOSE 102* 04/15/2011 0427   BUN 8 04/15/2011 0427   CREATININE 0.91 04/18/2011 0442   CALCIUM 9.0 04/15/2011 0427   PROT 8.0 04/10/2011 0900   ALBUMIN 4.5 04/10/2011 0900   AST 25 04/10/2011 0900   ALT 26 04/10/2011 0900   ALKPHOS 99 04/10/2011 0900   BILITOT 0.4 04/10/2011 0900   GFRNONAA >90 04/18/2011 0442   GFRAA >90 04/18/2011 0442   CEA 2.4 on 04/10/2011     Assessment/Plan:   1. 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 History of bipolar disease  3. Right inguinal hernia   Disposition:   He has been diagnosed with stage III colorectal cancer. I discussed the diagnosis, prognosis, and adjuvant treatment options with Taylor [name] and his family. We reviewed the surgical pathology report in detail. I discussed the distinction between colon and rectal cancer.  He has a significant chance of developing recurrent disease. I discussed the data confirming a benefit from  adjuvant systemic chemotherapy in this setting. The tumor was at the peritoneal reflection. I will refer him to Taylor [name] to consider the indication for radiation, but I suspect his survival will be mostly dictated by the systemic nature of the illness. The tumor was in the high rectum or low sigmoid colon so I doubt radiation will be indicated.  We discussed 5-fluorouracil-based adjuvant therapy and the expected benefit associated with the addition of oxaliplatin. He agrees to proceed with adjuvant 5-fluorouracil and oxaliplatin. We discussed the specifics of the CAPOX and FOLFOX regimens. The plan is to proceed with adjuvant FOLFOX chemotherapy. I reviewed the potential toxicities associated with this chemotherapy regimen including the chance for nausea/vomiting, mucositis, diarrhea, alopecia, and hematologic toxicity. We discussed the hand/foot syndrome and skin hyperpigmentation associated with 5-fluorouracil. We reviewed the various types of neuropathy associated with oxaliplatin.  He will be scheduled for a chemotherapy teaching class, nutrition appointment, and staging CT of the chest on 05/15/2011. A first cycle of FOLFOX chemotherapy and office visit  have been scheduled for 05/21/2011 . We will submit the tumor for microsatellite instability testing.  Taylor [name] 05/10/2011, 6:19 PM

## 2011-05-10 NOTE — Progress Notes (Signed)
[facility] Brief Psychosocial Assessment Clinical Social Work  Clinical Social Work was referred by patient navigator for assessment of psychosocial needs.  Clinical Social Worker met with patient in the exam room to offer support and assess for needs.  Per pt navigator, pt has a history or mental health issues and is currently being followed at the [facility].  Pt stated he was doing "ok", but was "ready to get this over with".  CSW offered pt additional support and informed pt of the supportive services available at [facility].  Pt did express some financial concerns; which CSW and pt plan to assess.  CSW encouraged pt to call with any questions or concerns.  CSW and pt plan to follow up next week to review support and financial resources.         Tamala Julian, MSW, LCSW Clinical Social Worker [facility] [phone]

## 2011-05-10 NOTE — Progress Notes (Signed)
This RN met with patient and explained navigator role.  Information given re: education on disease and chemotherapy drugs.  Resource phone numbers given. Referral made to dietician and SW. Pt with h/o depression.  SW aware. Patient is a smoker and needs smoking cessation instruction.  Patient referred for 05/15/11 cancer conference.  Will continue to follow.

## 2011-05-10 NOTE — Telephone Encounter (Signed)
appts made and printred for pt,mw to add chemo for 4.30,pt aware that chemo will follow    aom

## 2011-05-10 NOTE — Progress Notes (Signed)
Patient reports he is followed by [facility] for psych issues of depression/bipolar disorder. Divorced and lives alone. 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

## 2011-05-13 ENCOUNTER — Telehealth: Payer: Self-pay | Admitting: *Deleted

## 2011-05-13 NOTE — Telephone Encounter (Signed)
MSI testing requested on pathology [phone] per Dr. Truett Perna.  This RN spoke with Wende Neighbors in pathology.

## 2011-05-14 ENCOUNTER — Encounter: Payer: Self-pay | Admitting: Radiation Oncology

## 2011-05-14 ENCOUNTER — Ambulatory Visit (INDEPENDENT_AMBULATORY_CARE_PROVIDER_SITE_OTHER): Payer: Medicaid Other | Admitting: Surgery

## 2011-05-14 ENCOUNTER — Encounter: Payer: Self-pay | Admitting: *Deleted

## 2011-05-14 ENCOUNTER — Other Ambulatory Visit: Payer: Medicaid Other

## 2011-05-14 VITALS — BP 126/87 | HR 85 | Temp 97.6°F | Ht 70.0 in | Wt 187.4 lb

## 2011-05-14 DIAGNOSIS — F329 Major depressive disorder, single episode, unspecified: Secondary | ICD-10-CM | POA: Insufficient documentation

## 2011-05-14 DIAGNOSIS — K4090 Unilateral inguinal hernia, without obstruction or gangrene, not specified as recurrent: Secondary | ICD-10-CM

## 2011-05-14 DIAGNOSIS — C189 Malignant neoplasm of colon, unspecified: Secondary | ICD-10-CM

## 2011-05-14 DIAGNOSIS — C801 Malignant (primary) neoplasm, unspecified: Secondary | ICD-10-CM | POA: Insufficient documentation

## 2011-05-14 NOTE — Progress Notes (Signed)
Visit Diagnoses: 1. Colon cancer, sigmoid   2. Inguinal hernia unilateral, non-recurrent     HISTORY: Patient returns for postoperative visit. He underwent low anterior resection of rectosigmoid adenocarcinoma. He is also had a left subclavian Port-A-Cath placed in anticipation of adjuvant chemotherapy. He has been evaluated by medical oncology. He is to see radiation oncology tomorrow.  EXAM: Midline abdominal incision is healing without complication. No sign of infection. No sign of herniation. Port-A-Cath site is healed nicely with minimal edema. No sign of infection. Examination shows bilateral inguinal hernias, greater on the right than on the left, both are reducible, right side is mildly tender.  IMPRESSION: #1 status post low anterior resection of rectosigmoid adenocarcinoma #2 status post infusion port placement #3 bilateral inguinal hernia, reducible  PLAN: Patient has seen medical oncology. Staging scans or ordered. Chemotherapy is scheduled to begin within next 2 weeks. Patient is scheduled to be evaluated by radiation oncology tomorrow.  At this point we will postpone hernia repair. Hopefully the patient can complete chemotherapy and radiation therapy before hernia repair will be necessary. If the patient becomes more symptomatic we may need to take a break from his chemotherapy and radiation therapy and proceed with right inguinal hernia repair. Due to his recent laparotomy, the patient is not a candidate for laparoscopic repair.  Patient will return in 6 weeks for followup.  Velora Heckler, MD, FACS General & Endocrine Surgery [facility]

## 2011-05-14 NOTE — Patient Instructions (Signed)
  COCOA BUTTER & VITAMIN E CREAM  ([employer] or other brand)  Apply cocoa butter/vitamin E cream to your incision 2 - 3 times daily.  Massage cream into incision for one minute with each application.  Use sunscreen (50 SPF or higher) for first 6 months after surgery if area is exposed to sun.  You may substitute Mederma or other scar reducing creams as desired.   

## 2011-05-15 ENCOUNTER — Ambulatory Visit
Admission: RE | Admit: 2011-05-15 | Discharge: 2011-05-15 | Disposition: A | Discharge status: Home / Self Care | Payer: Medicaid Other | Source: Ambulatory Visit | Attending: Radiation Oncology | Admitting: Radiation Oncology

## 2011-05-15 ENCOUNTER — Encounter: Payer: Self-pay | Admitting: Radiation Oncology

## 2011-05-15 ENCOUNTER — Other Ambulatory Visit: Payer: Self-pay | Admitting: *Deleted

## 2011-05-15 ENCOUNTER — Ambulatory Visit (HOSPITAL_COMMUNITY)
Admission: RE | Admit: 2011-05-15 | Discharge: 2011-05-15 | Disposition: A | Discharge status: Home / Self Care | Payer: Medicaid Other | Source: Ambulatory Visit | Attending: Oncology | Admitting: Oncology

## 2011-05-15 ENCOUNTER — Encounter (HOSPITAL_COMMUNITY): Payer: Self-pay

## 2011-05-15 VITALS — BP 136/93 | HR 81 | Temp 97.9°F | Resp 20 | Ht 70.0 in | Wt 186.2 lb

## 2011-05-15 DIAGNOSIS — C187 Malignant neoplasm of sigmoid colon: Secondary | ICD-10-CM | POA: Insufficient documentation

## 2011-05-15 DIAGNOSIS — C189 Malignant neoplasm of colon, unspecified: Secondary | ICD-10-CM

## 2011-05-15 DIAGNOSIS — Z79899 Other long term (current) drug therapy: Secondary | ICD-10-CM | POA: Insufficient documentation

## 2011-05-15 HISTORY — DX: Multiple fractures of ribs, unspecified side, initial encounter for closed fracture: S22.49XA

## 2011-05-15 HISTORY — DX: Fracture of one rib, unspecified side, initial encounter for closed fracture: S22.39XA

## 2011-05-15 HISTORY — DX: Malignant neoplasm of colon, unspecified: C18.9

## 2011-05-15 MED ORDER — PROCHLORPERAZINE MALEATE 10 MG PO TABS: 10.0000 mg | ORAL_TABLET | Freq: Four times a day (QID) | ORAL | Status: DC | PRN

## 2011-05-15 MED ORDER — LIDOCAINE-PRILOCAINE 2.5-2.5 % EX CREA: TOPICAL_CREAM | CUTANEOUS | Status: DC | PRN

## 2011-05-15 NOTE — Progress Notes (Signed)
[facility] Radiation Oncology NEW PATIENT EVALUATION  Name: Taylor [name] MRN: [id number]  Date:   05/15/2011           DOB: 12-16-1955  Status: outpatient   CC: Taylor Leader, MD, MD  Taylor Artist, MD Taylor [name], M.D.   REFERRING PHYSICIAN: Ladene Artist, MD   DIAGNOSIS: The encounter diagnosis was Colon cancer, sigmoid.    HISTORY OF PRESENT ILLNESS:  Taylor [name] is a 56 y.o. male who is seen today for an initial consultation visit. The patient has had a history of some progressive abdominal distention and he also states that he had some abdominal pain. The patient therefore presented to the emergency room in mid March 2013 and a CT scan of the abdomen and pelvis was performed. This did show some: Neck wall thickening within the sigmoid colon and a 1.9 cm soft tissue attenuation abutting the sigmoid colon wall. Some additional subcentimeter lymph nodes were identified. The patient therefore proceeded with a colonoscopy. This revealed a malignant partially obstructing tumor in the distal sigmoid colon. This was present at 20 cm. The patient proceeded to the operating room on 04/11/2011. A low anterior resection was performed and the tumor was initially it identified at being at the. Medial reflection. The pathology from this procedure return positive for an invasive adenocarcinoma which was well differentiated. This measured 5.0 cm. Lymphovascular space invasion was identified as was perineural invasion. There was a separate focus of adenocarcinoma within the mesentery measuring 2.5 cm. 7 of the 19 lymph nodes were positive for carcinoma. The margins were negative with the proximal margin being 10 cm and the distal margin being 5 cm. The secondary mass was 0.4 cm to the ankle mesenteric margins of this was also adequately negative. The final pathologic staging was a T3 N2B tumor.  This patient states that he is recovering well from this procedure. He saw Dr.  Elesa [name] recently and he felt that he was recovering from his surgery quite well. The patient does have an issue of continued inguinal hernias. Patient is somewhat concerned about this but he indicates that he is doing very well after his surgery for his colorectal cancer. The patient states his bowels are doing well with no problems with ongoing diarrhea constipation or other related issues  PREVIOUS RADIATION THERAPY: No   PAST MEDICAL HISTORY:  has a past medical history of Inguinal hernia; Colon tumor; Depression; Hemorrhoids; Full dentures; Cancer (04/11/11); Rib fractures; and Colon cancer (04/11/2011).     PAST SURGICAL HISTORY:  Past Surgical History  Procedure Date  . Colostomy revision 04/11/2011    Procedure: COLON RESECTION SIGMOID;  Surgeon: [place] Heckler, MD;  Location: [facility];  Service: General;  Laterality: N/A;  low anterior colon resection   . Colon surgery 04/11/11    Sigmoid colectomy  . Portacath placement   . Portacath placement 05/02/2011    Procedure: INSERTION PORT-A-CATH;  Surgeon: Taylor Heckler, MD;  Location: [facility];  Service: General;  Laterality: N/A;     FAMILY HISTORY: family history includes Heart disease in his father.   SOCIAL HISTORY:  reports that he has been smoking.  He has quit using smokeless tobacco. He reports that he drinks alcohol. He reports that he does not use illicit drugs.   ALLERGIES: Codeine   MEDICATIONS:  Current Outpatient Prescriptions  Medication Sig Dispense Refill  . ARIPiprazole (ABILIFY) 10 MG tablet Take 10 mg by mouth every morning.       Marland Kitchen  clonazePAM (KLONOPIN) 0.5 MG tablet Take 0.5 mg by mouth every morning.      . escitalopram (LEXAPRO) 20 MG tablet Take 20 mg by mouth every morning.       Marland Kitchen oxyCODONE-acetaminophen (PERCOCET) 5-325 MG per tablet Take 1 tablet by mouth every 4 (four) hours as needed.      . traZODone (DESYREL) 50 MG tablet Take 50 mg by mouth at bedtime.      Marland Kitchen HYDROcodone-acetaminophen (NORCO) 10-325 MG  per tablet Take 1 tablet by mouth every 6 (six) hours as needed.         REVIEW OF SYSTEMS:  A comprehensive review of systems was negative.    PHYSICAL EXAM:  height is 5\' 10"  (1.778 m) and weight is 186 lb 3.2 oz (84.46 kg). His oral temperature is 97.9 F (36.6 C). His blood pressure is 136/93 and his pulse is 81. His respiration is 20.   General: Well-developed male in no acute distress HEENT: Normocephalic, atraumatic; oral cavity clear Neck: Supple without any lymphadenopathy Cardiovascular: Regular rate and rhythm Respiratory: Clear to auscultation bilaterally GI: Soft, nontender, normal bowel sounds Extremities: No edema present Neuro: No focal deficits Rectal exam deferred     LABORATORY DATA:  Lab Results  Component Value Date   WBC 8.1 04/15/2011   HGB 13.5 04/15/2011   HCT 40.6 04/15/2011   MCV 93.5 04/15/2011   PLT 239 04/15/2011   Lab Results  Component Value Date   NA 138 04/15/2011   K 4.3 04/15/2011   CL 101 04/15/2011   CO2 33* 04/15/2011   Lab Results  Component Value Date   ALT 26 04/10/2011   AST 25 04/10/2011   ALKPHOS 99 04/10/2011   BILITOT 0.4 04/10/2011      IMPRESSION: Pleasant 56 year old male who is status post a low anterior resection for a distal sigmoid cancer which pathologically corresponded to a T3 N2 B. M0 tumor.   PLAN: The patient has had a port placed and has discussed chemotherapy with Dr. Truett [name]. I have had a chance to review the patient's case including his operative notes: Asked her reports and imaging as well as the final pathologic report. Based on all of this information I don't believe that radiation would be of substantial benefit at all for the patient. I certainly believe that adjuvant chemotherapy would be the dominant beneficial modality for him and he again has discussed this with Dr. Truett [name]. The patient's local risk of recurrence itself I believe is outweighed by the significant risk of distant recurrence.  I discussed  all of this with the patient and he was fine with my recommendation not to proceed with radiation. All of his questions were answered. The patient was given contact information if I can be of further assistance and certainly I would be happy to see the patient at any point in the future if there was a candidate target area for consideration of radiation treatment.

## 2011-05-15 NOTE — Progress Notes (Signed)
Please see the Nurse Progress Note in the MD Initial Consult Encounter for this patient. 

## 2011-05-15 NOTE — Progress Notes (Signed)
Pt denies diarrhea, constipation. Appetite good, denies fatigue. States he was dx w/right inguinal hernia per Dr Gerrit Friends 05/10/11. Takes Percocet for hernia-related sharp pain, "feels like nerve pain in top of right leg". Good pain relief.

## 2011-05-17 NOTE — Progress Notes (Signed)
Encounter addended by: Glennie Hawk, RN on: 05/17/2011  2:40 PM<BR>     Documentation filed: Charges VN

## 2011-05-20 ENCOUNTER — Other Ambulatory Visit: Payer: Self-pay | Admitting: Oncology

## 2011-05-21 ENCOUNTER — Telehealth: Payer: Self-pay | Admitting: Oncology

## 2011-05-21 ENCOUNTER — Ambulatory Visit (HOSPITAL_BASED_OUTPATIENT_CLINIC_OR_DEPARTMENT_OTHER): Payer: Medicaid Other

## 2011-05-21 ENCOUNTER — Ambulatory Visit (HOSPITAL_BASED_OUTPATIENT_CLINIC_OR_DEPARTMENT_OTHER): Payer: Medicaid Other | Admitting: Nurse Practitioner

## 2011-05-21 VITALS — BP 140/86 | HR 86 | Temp 96.9°F | Ht 70.0 in | Wt 187.4 lb

## 2011-05-21 DIAGNOSIS — Z5111 Encounter for antineoplastic chemotherapy: Secondary | ICD-10-CM

## 2011-05-21 DIAGNOSIS — K4090 Unilateral inguinal hernia, without obstruction or gangrene, not specified as recurrent: Secondary | ICD-10-CM

## 2011-05-21 DIAGNOSIS — F319 Bipolar disorder, unspecified: Secondary | ICD-10-CM

## 2011-05-21 DIAGNOSIS — C189 Malignant neoplasm of colon, unspecified: Secondary | ICD-10-CM

## 2011-05-21 MED ORDER — DEXTROSE 5 % IV SOLN
Freq: Once | INTRAVENOUS | Status: AC
  Administered 2011-05-21: 12:00:00 via INTRAVENOUS

## 2011-05-21 MED ORDER — SODIUM CHLORIDE 0.9 % IV SOLN
2400.0000 mg/m2 | INTRAVENOUS | Status: DC
  Administered 2011-05-21: 4900 mg via INTRAVENOUS
  Filled 2011-05-21: qty 98

## 2011-05-21 MED ORDER — FLUOROURACIL CHEMO INJECTION 2.5 GM/50ML
400.0000 mg/m2 | Freq: Once | INTRAVENOUS | Status: AC
  Administered 2011-05-21: 800 mg via INTRAVENOUS
  Filled 2011-05-21: qty 16

## 2011-05-21 MED ORDER — OXYCODONE-ACETAMINOPHEN 5-325 MG PO TABS
1.0000 | ORAL_TABLET | ORAL | Status: AC
  Administered 2011-05-21: 1 via ORAL

## 2011-05-21 MED ORDER — OXALIPLATIN CHEMO INJECTION 100 MG/20ML
85.0000 mg/m2 | Freq: Once | INTRAVENOUS | Status: AC
  Administered 2011-05-21: 175 mg via INTRAVENOUS
  Filled 2011-05-21: qty 35

## 2011-05-21 MED ORDER — LEUCOVORIN CALCIUM INJECTION 350 MG
400.0000 mg/m2 | Freq: Once | INTRAVENOUS | Status: AC
  Administered 2011-05-21: 816 mg via INTRAVENOUS
  Filled 2011-05-21: qty 40.8

## 2011-05-21 MED ORDER — DEXAMETHASONE SODIUM PHOSPHATE 10 MG/ML IJ SOLN
10.0000 mg | Freq: Once | INTRAMUSCULAR | Status: AC
  Administered 2011-05-21: 10 mg via INTRAVENOUS

## 2011-05-21 MED ORDER — ONDANSETRON 8 MG/50ML IVPB (CHCC)
8.0000 mg | Freq: Once | INTRAVENOUS | Status: AC
  Administered 2011-05-21: 8 mg via INTRAVENOUS

## 2011-05-21 NOTE — Patient Instructions (Signed)
You have received FOLFOX treatment today.  Do not drink cold beverages.  Drink 64 oz water in addition to fluids with meals daily.  Call  [phone] infusystem number if any problems with pump.  Return at 2:00pm Thursday for pump d/c.  Call if temp > 100.5 or any other signs or symptoms,

## 2011-05-21 NOTE — Telephone Encounter (Signed)
gv pt appt schedule for may.  °

## 2011-05-21 NOTE — Progress Notes (Signed)
OFFICE PROGRESS NOTE  Interval history:  [name] returns as scheduled. He is seen prior to cycle 1 of FOLFOX. He reports that he feels well. No nausea or vomiting. Bowels moving regularly. He has a good appetite.   Objective: Blood pressure 140/86, pulse 86, temperature 96.9 F (36.1 C), temperature source Oral, height 5\' 10"  (1.778 m), weight 187 lb 6.4 oz (85.004 kg).  Oropharynx is without thrush or ulceration. Lungs are clear. Regular cardiac rhythm. Port-A-Cath site is without erythema. Abdomen is soft and nontender. No hepatomegaly. Extremities are without edema.  Lab Results: Lab Results  Component Value Date   WBC 8.1 04/15/2011   HGB 13.5 04/15/2011   HCT 40.6 04/15/2011   MCV 93.5 04/15/2011   PLT 239 04/15/2011    Chemistry:    Chemistry      Component Value Date/Time   NA 138 04/15/2011 0427   K 4.3 04/15/2011 0427   CL 101 04/15/2011 0427   CO2 33* 04/15/2011 0427   BUN 8 04/15/2011 0427   CREATININE 0.91 04/18/2011 0442      Component Value Date/Time   CALCIUM 9.0 04/15/2011 0427   ALKPHOS 99 04/10/2011 0900   AST 25 04/10/2011 0900   ALT 26 04/10/2011 0900   BILITOT 0.4 04/10/2011 0900       Studies/Results: Ct Chest Wo Contrast  05/15/2011  *RADIOLOGY REPORT*  Clinical Data: Colon cancer diagnosed 03/2011, for staging  CT CHEST WITHOUT CONTRAST  Technique:  Multidetector CT imaging of the chest was performed following the standard protocol without IV contrast.  Comparison: Chest radiograph dated 05/02/2011.  Findings: Mild centrilobular and paraseptal emphysematous changes. No suspicious pulmonary nodules.  No pleural effusion or pneumothorax.  Visualized thyroid is unremarkable.  The heart is normal in size.  No pericardial effusion. No suspicious mediastinal or axillary lymphadenopathy.  Left chest port.  Visualized upper abdomen is unremarkable.  Mild degenerative changes of the visualized thoracolumbar spine. Old right posterolateral 3rd-5th rib deformities.   IMPRESSION: No evidence of metastatic disease in the chest.  Mild centrilobular and paraseptal emphysematous changes.  Original Report Authenticated By: Charline Bills, M.D.   Dg Chest Port 1 View  05/02/2011  *RADIOLOGY REPORT*  Clinical Data: Confirm placement of infusion porta catheter  PORTABLE CHEST - 1 VIEW  Comparison: 10/26/2010  Findings: Grossly unchanged cardiac silhouette and mediastinal contours.  Interval placement of left subclavian vein approach Port- A-Cath with tip overlying the mid SVC.  The lungs are hyperinflated.  No definite pneumothorax.  Examination is degraded secondary exclusion of the bilateral costophrenic angles.  No definite pleural effusion.  Grossly unchanged bones.  IMPRESSION: Interval placement of left subclavian vein approach Port-A-Cath with tip over the mid SVC.  No pneumothorax.  Original Report Authenticated By: Waynard Reeds, M.D.    Medications: I have reviewed the patient's current medications.  Assessment/Plan:  1. 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 2. CT scan chest 05/15/2011 with no evidence of metastatic disease in the chest. Mild centrilobular and paraseptal emphysematous changes. 3. Normal preoperative CEA (2.4 on 04/10/2011). 4. History of bipolar disease. 5. Right inguinal hernia.  Disposition-[name] appears stable. Plan to proceed with cycle 1 of FOLFOX today as scheduled. We again reviewed potential toxicities associated with this regimen. He has attended a chemotherapy education class. He will return for a followup visit and cycle 2 FOLFOX in 2 weeks. He will contact the office in  the interim with any problems.  Plan reviewed with Dr. Derenda Fennel, Misty Stanley ANP/GNP-BC

## 2011-05-21 NOTE — Progress Notes (Signed)
Patient c/o pain to abdominal cavity where stitches are.  Takes percocet 5/325 mg 1 q4hrs prn at home.  Verbal order received and read back from Dr. Truett Perna to administer percocet.  Patient describes pain as burning sensation.  Rates pain as a six to seven on pain scale.  At 1458 pt. Says he feels improvement with abd. Pain  At 1430 patient doing well.  No burning or pain to abdominal area.  He feels this burning sensation is internal at scar tissue perhaps but it comes and goes.

## 2011-05-21 NOTE — Progress Notes (Signed)
Discharged at 1645 after reviewing spill kit, INFU System 1-800 number and side effects.  He called for a ride home.  Will drive himself for pump d/c.

## 2011-05-22 ENCOUNTER — Encounter: Payer: Self-pay | Admitting: *Deleted

## 2011-05-22 ENCOUNTER — Telehealth: Payer: Self-pay | Admitting: *Deleted

## 2011-05-22 DIAGNOSIS — C189 Malignant neoplasm of colon, unspecified: Secondary | ICD-10-CM

## 2011-05-22 NOTE — Telephone Encounter (Signed)
Message copied by Augusto Garbe on Wed May 22, 2011 12:42 PM ------      Message from: Augusto Garbe      Created: Tue May 21, 2011  5:45 PM      Regarding: Chemotherapy Follow Up       Dr. Truett Perna      1st FOLFOX      [phone]

## 2011-05-22 NOTE — Telephone Encounter (Signed)
Patient received 1st FOLFOX 05-21-11.  Reports Feeling weak and nausea at 7:00 pm last night and took anti-emetic.  No emesis.  Reports jaw feeling stiff at times.  Tried to eat a piece of bologna from the refrigerator and mouth felt funny.  Instructed to eat and drink warm foods.  Reports more bowel movements yesterday.  Has been out today and eating so denies problems at this time.

## 2011-05-23 ENCOUNTER — Ambulatory Visit (HOSPITAL_BASED_OUTPATIENT_CLINIC_OR_DEPARTMENT_OTHER): Payer: Medicaid Other

## 2011-05-23 VITALS — BP 124/85 | HR 81 | Temp 97.9°F

## 2011-05-23 DIAGNOSIS — C189 Malignant neoplasm of colon, unspecified: Secondary | ICD-10-CM

## 2011-05-23 DIAGNOSIS — Z452 Encounter for adjustment and management of vascular access device: Secondary | ICD-10-CM

## 2011-05-23 DIAGNOSIS — C19 Malignant neoplasm of rectosigmoid junction: Secondary | ICD-10-CM

## 2011-05-23 MED ORDER — HEPARIN SOD (PORK) LOCK FLUSH 100 UNIT/ML IV SOLN
500.0000 [IU] | Freq: Once | INTRAVENOUS | Status: AC | PRN
  Administered 2011-05-23: 500 [IU]
  Filled 2011-05-23: qty 5

## 2011-05-23 MED ORDER — HEPARIN SOD (PORK) LOCK FLUSH 100 UNIT/ML IV SOLN: 500.0000 [IU] | Freq: Once | INTRAVENOUS | Status: DC | PRN

## 2011-05-23 MED ORDER — SODIUM CHLORIDE 0.9 % IJ SOLN
10.0000 mL | INTRAMUSCULAR | Status: DC | PRN
  Administered 2011-05-23: 10 mL
  Filled 2011-05-23: qty 10

## 2011-05-23 MED ORDER — SODIUM CHLORIDE 0.9 % IJ SOLN: 10.0000 mL | INTRAMUSCULAR | Status: DC | PRN

## 2011-05-23 NOTE — Progress Notes (Signed)
Addended by: Melton Krebs D on: 05/23/2011 03:13 PM   Modules accepted: Orders

## 2011-05-23 NOTE — Patient Instructions (Signed)
Call MD if you have any problems. 

## 2011-05-30 ENCOUNTER — Telehealth (INDEPENDENT_AMBULATORY_CARE_PROVIDER_SITE_OTHER): Payer: Self-pay | Admitting: General Surgery

## 2011-05-30 NOTE — Telephone Encounter (Signed)
Pt calling for pain meds.  Hydrocodone 5/ 325 mg, # 30, 1-2 po Q4-6H prn pain, NO refill called to [facility]:  [phone].

## 2011-06-02 ENCOUNTER — Other Ambulatory Visit: Payer: Self-pay | Admitting: Oncology

## 2011-06-04 ENCOUNTER — Telehealth: Payer: Self-pay | Admitting: Oncology

## 2011-06-04 ENCOUNTER — Ambulatory Visit (HOSPITAL_BASED_OUTPATIENT_CLINIC_OR_DEPARTMENT_OTHER): Payer: Medicaid Other | Admitting: Oncology

## 2011-06-04 ENCOUNTER — Other Ambulatory Visit (HOSPITAL_BASED_OUTPATIENT_CLINIC_OR_DEPARTMENT_OTHER): Payer: Medicaid Other | Admitting: Lab

## 2011-06-04 ENCOUNTER — Ambulatory Visit (HOSPITAL_BASED_OUTPATIENT_CLINIC_OR_DEPARTMENT_OTHER): Payer: Medicaid Other

## 2011-06-04 VITALS — BP 129/86 | HR 82 | Temp 97.6°F | Ht 70.0 in | Wt 185.0 lb

## 2011-06-04 DIAGNOSIS — C19 Malignant neoplasm of rectosigmoid junction: Secondary | ICD-10-CM

## 2011-06-04 DIAGNOSIS — C189 Malignant neoplasm of colon, unspecified: Secondary | ICD-10-CM

## 2011-06-04 DIAGNOSIS — Z5111 Encounter for antineoplastic chemotherapy: Secondary | ICD-10-CM

## 2011-06-04 DIAGNOSIS — K4020 Bilateral inguinal hernia, without obstruction or gangrene, not specified as recurrent: Secondary | ICD-10-CM

## 2011-06-04 LAB — COMPREHENSIVE METABOLIC PANEL
AST: 20 U/L (ref 0–37)
Albumin: 4.8 g/dL (ref 3.5–5.2)
Alkaline Phosphatase: 88 U/L (ref 39–117)
BUN: 11 mg/dL (ref 6–23)
Creatinine, Ser: 0.92 mg/dL (ref 0.50–1.35)
Glucose, Bld: 92 mg/dL (ref 70–99)
Total Bilirubin: 0.3 mg/dL (ref 0.3–1.2)

## 2011-06-04 LAB — CBC WITH DIFFERENTIAL/PLATELET
Basophils Absolute: 0 10*3/uL (ref 0.0–0.1)
EOS%: 2.7 % (ref 0.0–7.0)
Eosinophils Absolute: 0.2 10*3/uL (ref 0.0–0.5)
HCT: 43.8 % (ref 38.4–49.9)
HGB: 15.3 g/dL (ref 13.0–17.1)
LYMPH%: 30.6 % (ref 14.0–49.0)
MCH: 31.9 pg (ref 27.2–33.4)
MCV: 91.4 fL (ref 79.3–98.0)
MONO%: 10.4 % (ref 0.0–14.0)
NEUT#: 4.4 10*3/uL (ref 1.5–6.5)
NEUT%: 55.9 % (ref 39.0–75.0)
Platelets: 199 10*3/uL (ref 140–400)
RDW: 13.8 % (ref 11.0–14.6)

## 2011-06-04 MED ORDER — OXYCODONE-ACETAMINOPHEN 5-325 MG PO TABS
1.0000 | ORAL_TABLET | Freq: Once | ORAL | Status: AC
  Administered 2011-06-04: 1 via ORAL

## 2011-06-04 MED ORDER — DEXTROSE 5 % IV SOLN
Freq: Once | INTRAVENOUS | Status: AC
  Administered 2011-06-04: 11:00:00 via INTRAVENOUS

## 2011-06-04 MED ORDER — FLUOROURACIL CHEMO INJECTION 2.5 GM/50ML
400.0000 mg/m2 | Freq: Once | INTRAVENOUS | Status: AC
  Administered 2011-06-04: 800 mg via INTRAVENOUS
  Filled 2011-06-04: qty 16

## 2011-06-04 MED ORDER — LEUCOVORIN CALCIUM INJECTION 350 MG
400.0000 mg/m2 | Freq: Once | INTRAVENOUS | Status: AC
  Administered 2011-06-04: 816 mg via INTRAVENOUS
  Filled 2011-06-04: qty 40.8

## 2011-06-04 MED ORDER — OXALIPLATIN CHEMO INJECTION 100 MG/20ML
85.0000 mg/m2 | Freq: Once | INTRAVENOUS | Status: AC
  Administered 2011-06-04: 175 mg via INTRAVENOUS
  Filled 2011-06-04: qty 35

## 2011-06-04 MED ORDER — ONDANSETRON 8 MG/50ML IVPB (CHCC)
8.0000 mg | Freq: Once | INTRAVENOUS | Status: AC
  Administered 2011-06-04: 8 mg via INTRAVENOUS

## 2011-06-04 MED ORDER — DEXAMETHASONE SODIUM PHOSPHATE 10 MG/ML IJ SOLN
10.0000 mg | Freq: Once | INTRAMUSCULAR | Status: AC
  Administered 2011-06-04: 10 mg via INTRAVENOUS

## 2011-06-04 MED ORDER — SODIUM CHLORIDE 0.9 % IV SOLN
2400.0000 mg/m2 | INTRAVENOUS | Status: DC
  Administered 2011-06-04: 4900 mg via INTRAVENOUS
  Filled 2011-06-04: qty 98

## 2011-06-04 NOTE — Telephone Encounter (Signed)
gv pt appt schedule for may/june °

## 2011-06-04 NOTE — Progress Notes (Signed)
   [facility]    OFFICE PROGRESS NOTE   INTERVAL HISTORY:    He returns as scheduled. He completed a first cycle of FOLFOX chemotherapy on 05/21/2011. He denies mouth sores. Cold sensitivity lasting for a few days following chemotherapy. Of mild nausea was relieved with Compazine. He had one day of diarrhea.  He continues to have pain at the right inguinal hernia. He takes approximately 2 oxycodone tablets per day for relief of pain.  Objective:  Vital signs in last 24 hours:  Blood pressure 129/86, pulse 82, temperature 97.6 F (36.4 C), temperature source Oral, height 5\' 10"  (1.778 m), weight 185 lb (83.915 kg).    HEENT: No thrush or ulcers Resp: Lungs clear bilaterally with distant breath sounds, no respiratory distress Cardio: Regular rate and rhythm GI: No hepatomegaly, nontender, healed midline incision, prominent subcutaneous suture material along the lower aspect of the midline scar. Reducible right inguinal hernia Vascular: No leg edema   Portacath/PICC-without erythema  Lab Results:  Lab Results  Component Value Date   WBC 7.8 06/04/2011   HGB 15.3 06/04/2011   HCT 43.8 06/04/2011   MCV 91.4 06/04/2011   PLT 199 06/04/2011   ANC 4.4    Medications: I have reviewed the patient's current medications.  Assessment/Plan: 1. 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 Adjuvant FOLFOX chemotherapy initiated 05/21/2011. 2. CT scan chest 05/15/2011 with no evidence of metastatic disease in the chest. Mild centrilobular and paraseptal emphysematous changes. 3. Normal preoperative CEA (2.4 on 04/10/2011). 4. History of bipolar disease. 5. Right inguinal hernia-he plans to followup with Dr. Gerrit Friends for repair of a hernia. This may need to occur during the course of adjuvant chemotherapy if the hernia associated pain persist.   Disposition:  He tolerated the  first cycle of adjuvant FOLFOX well. He will complete cycle 2 today. Mr. Roher will return for an office visit and cycle 3 in 2 weeks.  I recommended he followup with Dr. Gerrit Friends if the hernia associated pain persists.   [name], MD  06/04/2011  10:13 AM

## 2011-06-04 NOTE — Patient Instructions (Signed)
[facility] Discharge Instructions for Patients Receiving Chemotherapy  Today you received the following chemotherapy agents; Adrucil, Oxaliplatin and Leucovorin.  To help prevent nausea and vomiting after your treatment, we encourage you to take your nausea medication. Begin taking it as often as prescribed by your physician.    If you develop nausea and vomiting that is not controlled by your nausea medication, call the clinic [phone]. If it is after clinic hours your family physician or the after hours number for the clinic or go to the Emergency Department.   BELOW ARE SYMPTOMS THAT SHOULD BE REPORTED IMMEDIATELY:  *FEVER GREATER THAN 100.5 F  *CHILLS WITH OR WITHOUT FEVER  NAUSEA AND VOMITING THAT IS NOT CONTROLLED WITH YOUR NAUSEA MEDICATION  *UNUSUAL SHORTNESS OF BREATH  *UNUSUAL BRUISING OR BLEEDING  TENDERNESS IN MOUTH AND THROAT WITH OR WITHOUT PRESENCE OF ULCERS  *URINARY PROBLEMS  *BOWEL PROBLEMS  UNUSUAL RASH Items with * indicate a potential emergency and should be followed up as soon as possible.  One of the nurses will contact you 24 hours after your treatment. Please let the nurse know about any problems that you may have experienced. Feel free to call the clinic you have any questions or concerns. The clinic phone number is [phone].   I have been informed and understand all the instructions given to me. I know to contact the clinic, my physician, or go to the Emergency Department if any problems should occur. I do not have any questions at this time, but understand that I may call the clinic during office hours   should I have any questions or need assistance in obtaining follow up care.    __________________________________________  _____________  __________ Signature of Patient or Authorized Representative            Date                   Time    __________________________________________ Nurse's Signature

## 2011-06-06 ENCOUNTER — Ambulatory Visit (HOSPITAL_BASED_OUTPATIENT_CLINIC_OR_DEPARTMENT_OTHER): Payer: Medicaid Other

## 2011-06-06 VITALS — BP 110/74 | HR 95 | Temp 97.4°F

## 2011-06-06 DIAGNOSIS — C19 Malignant neoplasm of rectosigmoid junction: Secondary | ICD-10-CM

## 2011-06-06 DIAGNOSIS — C189 Malignant neoplasm of colon, unspecified: Secondary | ICD-10-CM

## 2011-06-06 DIAGNOSIS — Z452 Encounter for adjustment and management of vascular access device: Secondary | ICD-10-CM

## 2011-06-06 MED ORDER — SODIUM CHLORIDE 0.9 % IJ SOLN
10.0000 mL | INTRAMUSCULAR | Status: DC | PRN
  Administered 2011-06-06: 10 mL
  Filled 2011-06-06: qty 10

## 2011-06-06 MED ORDER — HEPARIN SOD (PORK) LOCK FLUSH 100 UNIT/ML IV SOLN
500.0000 [IU] | Freq: Once | INTRAVENOUS | Status: AC | PRN
  Administered 2011-06-06: 500 [IU]
  Filled 2011-06-06: qty 5

## 2011-06-06 NOTE — Patient Instructions (Signed)
Call MD for problems 

## 2011-06-13 ENCOUNTER — Telehealth (INDEPENDENT_AMBULATORY_CARE_PROVIDER_SITE_OTHER): Payer: Self-pay

## 2011-06-13 NOTE — Telephone Encounter (Signed)
May call in refill for Vicodin, dispense #20, no refills. Velora Heckler, MD, [facility] Office: [phone]

## 2011-06-13 NOTE — Telephone Encounter (Signed)
Patient called wanting refill of hydrocodone 5/325. He has an appointment next Tuesday or follow up. Please advise.Marland KitchenMarland Kitchen

## 2011-06-13 NOTE — Telephone Encounter (Signed)
Vicodin 5/325 #20 1 po q 4-6h prn pain no add refill called [facility] [address]. Pt aware.

## 2011-06-13 NOTE — Telephone Encounter (Signed)
         Joellyn Haff D - 2:53 PM ','<More Detail >>       [place]ren[place], [place] [phone])         Sent:  Thu Jun 13, 2011 2:55 PM   To:  Velora Heckler, MD   Cc:  Joanette Gula, LPN                          [facility] Size      Small Medium Large Extra Extra Large             Taylor [name]  Description:  56 year old male  06/13/2011 2:53 PM Telephone Provider:  Brennan Bailey, MA  MRN: [id number] Department:  Jolene Provost                Call Documentation     GERKIN,TODD Judie Petit, MD 06/13/2011 3:01 PM Signed  May call in refill for Vicodin, dispense #20, no refills.  Velora Heckler, MD, [facility]  Office: [phone]   Brennan Bailey, MA 06/13/2011 2:55 PM Signed  Patient called wanting refill of hydrocodone 5/325. He has an appointment next Tuesday or follow up. Please advise...          Encounter MyChart Messages     No messages in this encounter         Routing History        From  To  Priority    06/13/2011 3:01 PM  Velora Heckler, MD  [place], [place]  Routine    06/13/2011 2:55 PM  Brennan Bailey, MA  Velora Heckler, MD Joanette Gula, LPN  Routine       Created by     Brennan Bailey, MA on 06/13/2011 2:53 PM                           Visit Pharmacy     [id number] [place], [place] - [address]         Contacts         Type  Contact  Phone    06/13/2011 2:53 PM  Phone (Incoming)  Taylor [name], Taylor [name] (Self)  [phone] Judie Petit)

## 2011-06-16 ENCOUNTER — Other Ambulatory Visit: Payer: Self-pay | Admitting: Oncology

## 2011-06-18 ENCOUNTER — Ambulatory Visit (HOSPITAL_BASED_OUTPATIENT_CLINIC_OR_DEPARTMENT_OTHER): Payer: Medicaid Other | Admitting: Nurse Practitioner

## 2011-06-18 ENCOUNTER — Encounter (INDEPENDENT_AMBULATORY_CARE_PROVIDER_SITE_OTHER): Payer: Self-pay | Admitting: Surgery

## 2011-06-18 ENCOUNTER — Ambulatory Visit (INDEPENDENT_AMBULATORY_CARE_PROVIDER_SITE_OTHER): Payer: Medicaid Other | Admitting: Surgery

## 2011-06-18 ENCOUNTER — Ambulatory Visit (HOSPITAL_BASED_OUTPATIENT_CLINIC_OR_DEPARTMENT_OTHER): Payer: Medicaid Other

## 2011-06-18 ENCOUNTER — Other Ambulatory Visit (HOSPITAL_BASED_OUTPATIENT_CLINIC_OR_DEPARTMENT_OTHER): Payer: Medicaid Other | Admitting: Lab

## 2011-06-18 ENCOUNTER — Telehealth: Payer: Self-pay | Admitting: Oncology

## 2011-06-18 VITALS — BP 153/94 | HR 86 | Temp 97.5°F | Ht 70.0 in | Wt 190.0 lb

## 2011-06-18 VITALS — BP 146/93 | HR 70 | Temp 97.5°F | Ht 70.0 in | Wt 186.5 lb

## 2011-06-18 DIAGNOSIS — D6959 Other secondary thrombocytopenia: Secondary | ICD-10-CM

## 2011-06-18 DIAGNOSIS — C19 Malignant neoplasm of rectosigmoid junction: Secondary | ICD-10-CM

## 2011-06-18 DIAGNOSIS — C189 Malignant neoplasm of colon, unspecified: Secondary | ICD-10-CM

## 2011-06-18 DIAGNOSIS — K4090 Unilateral inguinal hernia, without obstruction or gangrene, not specified as recurrent: Secondary | ICD-10-CM

## 2011-06-18 DIAGNOSIS — K4020 Bilateral inguinal hernia, without obstruction or gangrene, not specified as recurrent: Secondary | ICD-10-CM

## 2011-06-18 DIAGNOSIS — Z5111 Encounter for antineoplastic chemotherapy: Secondary | ICD-10-CM

## 2011-06-18 LAB — CBC WITH DIFFERENTIAL/PLATELET
Basophils Absolute: 0 10*3/uL (ref 0.0–0.1)
EOS%: 3.3 % (ref 0.0–7.0)
Eosinophils Absolute: 0.2 10*3/uL (ref 0.0–0.5)
HCT: 43.6 % (ref 38.4–49.9)
HGB: 15 g/dL (ref 13.0–17.1)
MCH: 32 pg (ref 27.2–33.4)
MCV: 93 fL (ref 79.3–98.0)
MONO%: 9.2 % (ref 0.0–14.0)
NEUT#: 3.5 10*3/uL (ref 1.5–6.5)
NEUT%: 56.9 % (ref 39.0–75.0)
RDW: 15.1 % — ABNORMAL HIGH (ref 11.0–14.6)

## 2011-06-18 LAB — COMPREHENSIVE METABOLIC PANEL
Albumin: 4.6 g/dL (ref 3.5–5.2)
BUN: 8 mg/dL (ref 6–23)
CO2: 28 mEq/L (ref 19–32)
Glucose, Bld: 88 mg/dL (ref 70–99)
Potassium: 4.4 mEq/L (ref 3.5–5.3)
Sodium: 137 mEq/L (ref 135–145)
Total Bilirubin: 0.5 mg/dL (ref 0.3–1.2)
Total Protein: 6.5 g/dL (ref 6.0–8.3)

## 2011-06-18 MED ORDER — LEUCOVORIN CALCIUM INJECTION 350 MG
400.0000 mg/m2 | Freq: Once | INTRAVENOUS | Status: AC
  Administered 2011-06-18: 816 mg via INTRAVENOUS
  Filled 2011-06-18: qty 40.8

## 2011-06-18 MED ORDER — OXALIPLATIN CHEMO INJECTION 100 MG/20ML
85.0000 mg/m2 | Freq: Once | INTRAVENOUS | Status: AC
  Administered 2011-06-18: 175 mg via INTRAVENOUS
  Filled 2011-06-18: qty 35

## 2011-06-18 MED ORDER — DEXAMETHASONE SODIUM PHOSPHATE 10 MG/ML IJ SOLN
10.0000 mg | Freq: Once | INTRAMUSCULAR | Status: AC
  Administered 2011-06-18: 10 mg via INTRAVENOUS

## 2011-06-18 MED ORDER — DEXTROSE 5 % IV SOLN
Freq: Once | INTRAVENOUS | Status: AC
  Administered 2011-06-18: 11:00:00 via INTRAVENOUS

## 2011-06-18 MED ORDER — FLUOROURACIL CHEMO INJECTION 2.5 GM/50ML
400.0000 mg/m2 | Freq: Once | INTRAVENOUS | Status: AC
  Administered 2011-06-18: 800 mg via INTRAVENOUS
  Filled 2011-06-18: qty 16

## 2011-06-18 MED ORDER — ONDANSETRON 8 MG/50ML IVPB (CHCC)
8.0000 mg | Freq: Once | INTRAVENOUS | Status: AC
  Administered 2011-06-18: 8 mg via INTRAVENOUS

## 2011-06-18 MED ORDER — HYDROCODONE-ACETAMINOPHEN 5-325 MG PO TABS
1.0000 | ORAL_TABLET | ORAL | Status: AC | PRN
  Administered 2011-06-18: 1 via ORAL

## 2011-06-18 MED ORDER — SODIUM CHLORIDE 0.9 % IV SOLN
2400.0000 mg/m2 | INTRAVENOUS | Status: DC
  Administered 2011-06-18: 4900 mg via INTRAVENOUS
  Filled 2011-06-18: qty 98

## 2011-06-18 NOTE — Progress Notes (Signed)
Visit Diagnoses: 1. Colon cancer, sigmoid   2. Inguinal hernia bilateral, non-recurrent     HISTORY: The patient returns for scheduled followup. He is now 6 weeks out from low anterior resection for adenocarcinoma of the distal sigmoid colon. Patient is currently receiving adjuvant chemotherapy. He continues to take intermittent doses of narcotics for pain related to his right inguinal hernia. Patient is having one to 2 bowel movements daily which are formed. He denies any bleeding per rectum.  EXAM: Abdomen is soft non-tender without palpable masses. No sign of herniation in midline incision.  Right inguinal hernia is reducible.  Left inguinal hernia is asymptomatic.  IMPRESSION: #1 status post low anterior resection for adenocarcinoma #2 right inguinal hernia, reducible, moderately symptomatic #3 left inguinal hernia, reducible, asymptomatic  PLAN: Patient will continue chemotherapy under the direction of his medical oncologist. I have given him a prescription for Percocet to take as needed for pain related to his right inguinal hernia. Certainly if he becomes more symptomatic, he may have to postpone chemotherapy and undergo herniorrhaphy. Otherwise patient will return to see me in 4 months. At that time we will plan for hernia repair if his chemotherapy continues to go well.  Velora Heckler, MD, FACS General & Endocrine Surgery [facility]

## 2011-06-18 NOTE — Patient Instructions (Signed)
  COCOA BUTTER & VITAMIN E CREAM  ([employer] or other brand)  Apply cocoa butter/vitamin E cream to your incision 2 - 3 times daily.  Massage cream into incision for one minute with each application.  Use sunscreen (50 SPF or higher) for first 6 months after surgery if area is exposed to sun.  You may substitute Mederma or other scar reducing creams as desired.   

## 2011-06-18 NOTE — Progress Notes (Signed)
1040- Pt rates pain "5/10" where inguinal hernia is in R groin area.  1120- Pt rates pain "3/10," states he is feeling better.

## 2011-06-18 NOTE — Progress Notes (Signed)
OFFICE PROGRESS NOTE  Interval history:  Taylor [name] returns as scheduled. He completed cycle 2 FOLFOX 06/04/2011. He denies nausea/vomiting. No mouth sores. No diarrhea. He noted cold sensitivity lasting for "a few days". He denies persistent neuropathy symptoms. He continues to have pain associated with the right inguinal hernia. He is taking Vicodin as needed.   Objective: Blood pressure 146/93, pulse 70, temperature 97.5 F (36.4 C), height 5\' 10"  (1.778 m), weight 186 lb 8 oz (84.596 kg).  Oropharynx is without thrush or ulceration. Lungs are clear. Breath sounds are distant. Regular cardiac rhythm. Port-A-Cath site is without erythema. Abdomen is soft and nontender. No hepatomegaly. Extremities are without edema. Vibratory sense is intact over the fingertips per tuning fork exam.  Lab Results: Lab Results  Component Value Date   WBC 6.1 06/18/2011   HGB 15.0 06/18/2011   HCT 43.6 06/18/2011   MCV 93.0 06/18/2011   PLT 117* 06/18/2011    Chemistry:    Chemistry      Component Value Date/Time   NA 136 06/04/2011 0857   K 4.7 06/04/2011 0857   CL 100 06/04/2011 0857   CO2 26 06/04/2011 0857   BUN 11 06/04/2011 0857   CREATININE 0.92 06/04/2011 0857      Component Value Date/Time   CALCIUM 9.5 06/04/2011 0857   ALKPHOS 88 06/04/2011 0857   AST 20 06/04/2011 0857   ALT 16 06/04/2011 0857   BILITOT 0.3 06/04/2011 0857       Studies/Results: No results found.  Medications: I have reviewed the patient's current medications.  Assessment/Plan:  1. 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 Adjuvant FOLFOX chemotherapy initiated 05/21/2011. 2. CT scan chest 05/15/2011 with no evidence of metastatic disease in the chest. Mild centrilobular and paraseptal emphysematous changes. 3. Normal preoperative CEA (2.4 on 04/10/2011). 4. History of bipolar disease. 5. Right inguinal hernia-he has  followup with Dr. Gerrit Friends later today. 6. Mild thrombocytopenia secondary to chemotherapy.  Disposition-Taylor [name] appears stable. Plan to proceed with cycle 3 FOLFOX today as scheduled. He will return for a followup visit and cycle 4 in 2 weeks. He will contact the office in the interim with any problems.  Plan reviewed with Dr. Truett Perna.  Lonna Cobb ANP/GNP-BC

## 2011-06-18 NOTE — Patient Instructions (Signed)
[facility] Discharge Instructions for Patients Receiving Chemotherapy  Today you received the following chemotherapy agents Oxaliplatin, Leucovorin, 5FU.  To help prevent nausea and vomiting after your treatment, we encourage you to take your nausea medication as directed by your MD.  If you develop nausea and vomiting that is not controlled by your nausea medication, call the clinic. If it is after clinic hours your family physician or the after hours number for the clinic or go to the Emergency Department.   BELOW ARE SYMPTOMS THAT SHOULD BE REPORTED IMMEDIATELY:  *FEVER GREATER THAN 100.5 F  *CHILLS WITH OR WITHOUT FEVER  NAUSEA AND VOMITING THAT IS NOT CONTROLLED WITH YOUR NAUSEA MEDICATION  *UNUSUAL SHORTNESS OF BREATH  *UNUSUAL BRUISING OR BLEEDING  TENDERNESS IN MOUTH AND THROAT WITH OR WITHOUT PRESENCE OF ULCERS  *URINARY PROBLEMS  *BOWEL PROBLEMS  UNUSUAL RASH Items with * indicate a potential emergency and should be followed up as soon as possible.  Feel free to call the clinic you have any questions or concerns. The clinic phone number is [phone].

## 2011-06-18 NOTE — Telephone Encounter (Signed)
Gv pt appt for june2013 

## 2011-06-20 ENCOUNTER — Ambulatory Visit (HOSPITAL_BASED_OUTPATIENT_CLINIC_OR_DEPARTMENT_OTHER): Payer: Medicaid Other

## 2011-06-20 VITALS — BP 117/71 | HR 79 | Temp 97.9°F

## 2011-06-20 DIAGNOSIS — C19 Malignant neoplasm of rectosigmoid junction: Secondary | ICD-10-CM

## 2011-06-20 DIAGNOSIS — C189 Malignant neoplasm of colon, unspecified: Secondary | ICD-10-CM

## 2011-06-20 MED ORDER — HEPARIN SOD (PORK) LOCK FLUSH 100 UNIT/ML IV SOLN
500.0000 [IU] | Freq: Once | INTRAVENOUS | Status: AC | PRN
  Administered 2011-06-20: 500 [IU]
  Filled 2011-06-20: qty 5

## 2011-06-20 MED ORDER — SODIUM CHLORIDE 0.9 % IJ SOLN
10.0000 mL | INTRAMUSCULAR | Status: DC | PRN
  Administered 2011-06-20: 10 mL
  Filled 2011-06-20: qty 10

## 2011-06-20 NOTE — Patient Instructions (Signed)
Call MD for problems 

## 2011-06-30 ENCOUNTER — Other Ambulatory Visit: Payer: Self-pay | Admitting: Oncology

## 2011-07-02 ENCOUNTER — Telehealth: Payer: Self-pay | Admitting: *Deleted

## 2011-07-02 ENCOUNTER — Other Ambulatory Visit (HOSPITAL_BASED_OUTPATIENT_CLINIC_OR_DEPARTMENT_OTHER): Payer: Medicaid Other | Admitting: Lab

## 2011-07-02 ENCOUNTER — Telehealth: Payer: Self-pay | Admitting: Oncology

## 2011-07-02 ENCOUNTER — Ambulatory Visit (HOSPITAL_BASED_OUTPATIENT_CLINIC_OR_DEPARTMENT_OTHER): Payer: Medicaid Other | Admitting: Nurse Practitioner

## 2011-07-02 ENCOUNTER — Ambulatory Visit (HOSPITAL_BASED_OUTPATIENT_CLINIC_OR_DEPARTMENT_OTHER): Payer: Medicaid Other

## 2011-07-02 VITALS — BP 133/82 | HR 75 | Temp 99.2°F | Ht 70.0 in | Wt 185.4 lb

## 2011-07-02 DIAGNOSIS — C189 Malignant neoplasm of colon, unspecified: Secondary | ICD-10-CM

## 2011-07-02 DIAGNOSIS — F319 Bipolar disorder, unspecified: Secondary | ICD-10-CM

## 2011-07-02 DIAGNOSIS — K4090 Unilateral inguinal hernia, without obstruction or gangrene, not specified as recurrent: Secondary | ICD-10-CM

## 2011-07-02 DIAGNOSIS — C187 Malignant neoplasm of sigmoid colon: Secondary | ICD-10-CM

## 2011-07-02 DIAGNOSIS — D696 Thrombocytopenia, unspecified: Secondary | ICD-10-CM

## 2011-07-02 DIAGNOSIS — K4020 Bilateral inguinal hernia, without obstruction or gangrene, not specified as recurrent: Secondary | ICD-10-CM

## 2011-07-02 DIAGNOSIS — Z5111 Encounter for antineoplastic chemotherapy: Secondary | ICD-10-CM

## 2011-07-02 DIAGNOSIS — C19 Malignant neoplasm of rectosigmoid junction: Secondary | ICD-10-CM

## 2011-07-02 LAB — COMPREHENSIVE METABOLIC PANEL
ALT: 23 U/L (ref 0–53)
AST: 30 U/L (ref 0–37)
Albumin: 3.9 g/dL (ref 3.5–5.2)
Calcium: 8.9 mg/dL (ref 8.4–10.5)
Chloride: 100 mEq/L (ref 96–112)
Creatinine, Ser: 0.91 mg/dL (ref 0.50–1.35)
Potassium: 4.3 mEq/L (ref 3.5–5.3)
Sodium: 135 mEq/L (ref 135–145)
Total Protein: 6.7 g/dL (ref 6.0–8.3)

## 2011-07-02 LAB — CBC WITH DIFFERENTIAL/PLATELET
BASO%: 0.7 % (ref 0.0–2.0)
EOS%: 3.8 % (ref 0.0–7.0)
MCH: 32.5 pg (ref 27.2–33.4)
MCHC: 34.5 g/dL (ref 32.0–36.0)
RBC: 4.58 10*6/uL (ref 4.20–5.82)
RDW: 16.6 % — ABNORMAL HIGH (ref 11.0–14.6)
WBC: 4.5 10*3/uL (ref 4.0–10.3)
lymph#: 1.7 10*3/uL (ref 0.9–3.3)

## 2011-07-02 MED ORDER — ONDANSETRON 8 MG/50ML IVPB (CHCC)
8.0000 mg | Freq: Once | INTRAVENOUS | Status: AC
  Administered 2011-07-02: 8 mg via INTRAVENOUS

## 2011-07-02 MED ORDER — DEXTROSE 5 % IV SOLN
Freq: Once | INTRAVENOUS | Status: AC
  Administered 2011-07-02: 12:00:00 via INTRAVENOUS

## 2011-07-02 MED ORDER — LEUCOVORIN CALCIUM INJECTION 350 MG
400.0000 mg/m2 | Freq: Once | INTRAVENOUS | Status: AC
  Administered 2011-07-02: 816 mg via INTRAVENOUS
  Filled 2011-07-02: qty 40.8

## 2011-07-02 MED ORDER — SODIUM CHLORIDE 0.9 % IV SOLN
2400.0000 mg/m2 | INTRAVENOUS | Status: DC
  Administered 2011-07-02: 4900 mg via INTRAVENOUS
  Filled 2011-07-02: qty 98

## 2011-07-02 MED ORDER — HEPARIN SOD (PORK) LOCK FLUSH 100 UNIT/ML IV SOLN
500.0000 [IU] | Freq: Once | INTRAVENOUS | Status: DC | PRN
  Filled 2011-07-02: qty 5

## 2011-07-02 MED ORDER — FLUOROURACIL CHEMO INJECTION 2.5 GM/50ML
400.0000 mg/m2 | Freq: Once | INTRAVENOUS | Status: AC
  Administered 2011-07-02: 800 mg via INTRAVENOUS
  Filled 2011-07-02: qty 16

## 2011-07-02 MED ORDER — DEXAMETHASONE SODIUM PHOSPHATE 10 MG/ML IJ SOLN
10.0000 mg | Freq: Once | INTRAMUSCULAR | Status: AC
  Administered 2011-07-02: 10 mg via INTRAVENOUS

## 2011-07-02 MED ORDER — OXALIPLATIN CHEMO INJECTION 100 MG/20ML
85.0000 mg/m2 | Freq: Once | INTRAVENOUS | Status: AC
  Administered 2011-07-02: 175 mg via INTRAVENOUS
  Filled 2011-07-02: qty 35

## 2011-07-02 MED ORDER — SODIUM CHLORIDE 0.9 % IJ SOLN
10.0000 mL | INTRAMUSCULAR | Status: DC | PRN
  Filled 2011-07-02: qty 10

## 2011-07-02 MED ORDER — OXYCODONE-ACETAMINOPHEN 5-325 MG PO TABS
1.0000 | ORAL_TABLET | Freq: Once | ORAL | Status: DC
  Administered 2011-07-02: 1 via ORAL

## 2011-07-02 NOTE — Patient Instructions (Signed)
Patient aware of next appointment; discharged home on pump and no complaints.

## 2011-07-02 NOTE — Telephone Encounter (Signed)
appts made and printed for pt,pt aware that tx will be added on 7/9,email to mw

## 2011-07-02 NOTE — Progress Notes (Signed)
OFFICE PROGRESS NOTE  Interval history:  Mr. Eggebrecht returns as scheduled. He completed cycle 3 FOLFOX on 06/18/2011. He denies nausea/vomiting. No diarrhea. He developed a few mouth sores. He was able to eat and drink without difficulty. Cold sensitivity lasted approximately 4 days. He denies persistent neuropathy symptoms. He continues to have pain associated with the right inguinal hernia. He is taking Percocet as needed.   Objective: Blood pressure 133/82, pulse 75, temperature 99.2 F (37.3 C), temperature source Oral, height 5\' 10"  (1.778 m), weight 185 lb 6.4 oz (84.097 kg).  Oropharynx is without thrush or ulceration. Lungs are clear. Breath sounds are distant. Regular cardiac rhythm. Port-A-Cath site is without erythema. Abdomen is soft and nontender. No hepatomegaly. Extremities are without edema. Vibratory sense is intact over the fingertips per tuning fork exam.  Lab Results: Lab Results  Component Value Date   WBC 4.5 07/02/2011   HGB 14.9 07/02/2011   HCT 43.2 07/02/2011   MCV 94.3 07/02/2011   PLT 116* 07/02/2011    Chemistry:    Chemistry      Component Value Date/Time   NA 137 06/18/2011 0817   K 4.4 06/18/2011 0817   CL 102 06/18/2011 0817   CO2 28 06/18/2011 0817   BUN 8 06/18/2011 0817   CREATININE 0.87 06/18/2011 0817      Component Value Date/Time   CALCIUM 8.7 06/18/2011 0817   ALKPHOS 79 06/18/2011 0817   AST 21 06/18/2011 0817   ALT 17 06/18/2011 0817   BILITOT 0.5 06/18/2011 0817       Studies/Results: No results found.  Medications: I have reviewed the patient's current medications.  Assessment/Plan:  1. 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 Adjuvant FOLFOX chemotherapy initiated 05/21/2011. 2. CT scan chest 05/15/2011 with no evidence of metastatic disease in the chest. Mild centrilobular and paraseptal emphysematous changes. 3. Normal preoperative  CEA (2.4 on 04/10/2011). 4. History of bipolar disease. 5. Right inguinal hernia. He is taking Percocet as needed. 6. Mild thrombocytopenia secondary to chemotherapy. Stable.  Disposition-Mr. Herendeen appears stable. Plan to proceed with cycle 4 FOLFOX today as scheduled. He will return for a followup visit and cycle 5 in 2 weeks. He will contact the office in the interim with any problems.  Lonna Cobb ANP/GNP-BC

## 2011-07-02 NOTE — Telephone Encounter (Signed)
Per staff message from Anne, I have scheduled appts.  JMW 

## 2011-07-04 ENCOUNTER — Ambulatory Visit (HOSPITAL_BASED_OUTPATIENT_CLINIC_OR_DEPARTMENT_OTHER): Payer: Medicaid Other

## 2011-07-04 VITALS — BP 119/87 | HR 90 | Temp 98.0°F

## 2011-07-04 DIAGNOSIS — C19 Malignant neoplasm of rectosigmoid junction: Secondary | ICD-10-CM

## 2011-07-04 DIAGNOSIS — C189 Malignant neoplasm of colon, unspecified: Secondary | ICD-10-CM

## 2011-07-04 MED ORDER — SODIUM CHLORIDE 0.9 % IJ SOLN
10.0000 mL | INTRAMUSCULAR | Status: DC | PRN
  Administered 2011-07-04: 10 mL
  Filled 2011-07-04: qty 10

## 2011-07-04 MED ORDER — HEPARIN SOD (PORK) LOCK FLUSH 100 UNIT/ML IV SOLN
500.0000 [IU] | Freq: Once | INTRAVENOUS | Status: AC | PRN
  Administered 2011-07-04: 500 [IU]
  Filled 2011-07-04: qty 5

## 2011-07-15 ENCOUNTER — Other Ambulatory Visit: Payer: Self-pay | Admitting: Oncology

## 2011-07-16 ENCOUNTER — Ambulatory Visit (HOSPITAL_BASED_OUTPATIENT_CLINIC_OR_DEPARTMENT_OTHER): Payer: Medicaid Other

## 2011-07-16 ENCOUNTER — Ambulatory Visit (HOSPITAL_BASED_OUTPATIENT_CLINIC_OR_DEPARTMENT_OTHER): Payer: Medicaid Other | Admitting: Oncology

## 2011-07-16 ENCOUNTER — Telehealth: Payer: Self-pay | Admitting: Oncology

## 2011-07-16 ENCOUNTER — Telehealth: Payer: Self-pay | Admitting: *Deleted

## 2011-07-16 ENCOUNTER — Other Ambulatory Visit (HOSPITAL_BASED_OUTPATIENT_CLINIC_OR_DEPARTMENT_OTHER): Payer: Medicaid Other | Admitting: Lab

## 2011-07-16 VITALS — BP 140/77 | HR 81 | Temp 98.9°F | Ht 70.0 in | Wt 184.2 lb

## 2011-07-16 DIAGNOSIS — Z5111 Encounter for antineoplastic chemotherapy: Secondary | ICD-10-CM

## 2011-07-16 DIAGNOSIS — C19 Malignant neoplasm of rectosigmoid junction: Secondary | ICD-10-CM

## 2011-07-16 DIAGNOSIS — C189 Malignant neoplasm of colon, unspecified: Secondary | ICD-10-CM

## 2011-07-16 LAB — CBC WITH DIFFERENTIAL/PLATELET
Basophils Absolute: 0 10*3/uL (ref 0.0–0.1)
Eosinophils Absolute: 0.2 10*3/uL (ref 0.0–0.5)
HCT: 43.5 % (ref 38.4–49.9)
HGB: 15.2 g/dL (ref 13.0–17.1)
LYMPH%: 28.9 % (ref 14.0–49.0)
MCV: 95 fL (ref 79.3–98.0)
MONO#: 0.8 10*3/uL (ref 0.1–0.9)
NEUT#: 2.6 10*3/uL (ref 1.5–6.5)
Platelets: 91 10*3/uL — ABNORMAL LOW (ref 140–400)
RBC: 4.58 10*6/uL (ref 4.20–5.82)
WBC: 5.1 10*3/uL (ref 4.0–10.3)
nRBC: 0 % (ref 0–0)

## 2011-07-16 LAB — COMPREHENSIVE METABOLIC PANEL
ALT: 21 U/L (ref 0–53)
CO2: 26 mEq/L (ref 19–32)
Calcium: 8 mg/dL — ABNORMAL LOW (ref 8.4–10.5)
Chloride: 104 mEq/L (ref 96–112)
Creatinine, Ser: 0.76 mg/dL (ref 0.50–1.35)
Glucose, Bld: 78 mg/dL (ref 70–99)
Total Protein: 6.2 g/dL (ref 6.0–8.3)

## 2011-07-16 MED ORDER — DEXTROSE 5 % IV SOLN
Freq: Once | INTRAVENOUS | Status: AC
  Administered 2011-07-16: 10:00:00 via INTRAVENOUS

## 2011-07-16 MED ORDER — LEUCOVORIN CALCIUM INJECTION 350 MG
400.0000 mg/m2 | Freq: Once | INTRAVENOUS | Status: AC
  Administered 2011-07-16: 816 mg via INTRAVENOUS
  Filled 2011-07-16: qty 40.8

## 2011-07-16 MED ORDER — OXYCODONE-ACETAMINOPHEN 5-325 MG PO TABS
1.0000 | ORAL_TABLET | Freq: Once | ORAL | Status: AC
Start: 2011-07-16 — End: 2011-07-16
  Administered 2011-07-16: 1 via ORAL

## 2011-07-16 MED ORDER — DEXAMETHASONE SODIUM PHOSPHATE 10 MG/ML IJ SOLN
10.0000 mg | Freq: Once | INTRAMUSCULAR | Status: AC
  Administered 2011-07-16: 10 mg via INTRAVENOUS

## 2011-07-16 MED ORDER — SODIUM CHLORIDE 0.9 % IV SOLN
2400.0000 mg/m2 | INTRAVENOUS | Status: DC
  Administered 2011-07-16: 4900 mg via INTRAVENOUS
  Filled 2011-07-16: qty 98

## 2011-07-16 MED ORDER — FLUOROURACIL CHEMO INJECTION 2.5 GM/50ML
400.0000 mg/m2 | Freq: Once | INTRAVENOUS | Status: AC
  Administered 2011-07-16: 800 mg via INTRAVENOUS
  Filled 2011-07-16: qty 16

## 2011-07-16 MED ORDER — OXALIPLATIN CHEMO INJECTION 100 MG/20ML
85.0000 mg/m2 | Freq: Once | INTRAVENOUS | Status: AC
  Administered 2011-07-16: 175 mg via INTRAVENOUS
  Filled 2011-07-16: qty 35

## 2011-07-16 MED ORDER — ONDANSETRON 8 MG/50ML IVPB (CHCC)
8.0000 mg | Freq: Once | INTRAVENOUS | Status: AC
  Administered 2011-07-16: 8 mg via INTRAVENOUS

## 2011-07-16 NOTE — Telephone Encounter (Signed)
Per staff message I have scheduled appt. JMW 

## 2011-07-16 NOTE — Progress Notes (Signed)
1035 OK to treat per Dr. Truett Perna with Platelets 91.  Also reported to MD pt c/o of right abd pain "due to hernia, "7" verbal order given for pain med.

## 2011-07-16 NOTE — Patient Instructions (Addendum)
[facility] Discharge Instructions for Patients Receiving Chemotherapy  Today you received the following chemotherapy agents Oxaliplatin, Leucovorin, and 5FU.  To help prevent nausea and vomiting after your treatment, we encourage you to take your nausea medication as prescribed.   If you develop nausea and vomiting that is not controlled by your nausea medication, call the clinic. If it is after clinic hours your family physician or the after hours number for the clinic or go to the Emergency Department.   BELOW ARE SYMPTOMS THAT SHOULD BE REPORTED IMMEDIATELY:  *FEVER GREATER THAN 100.5 F  *CHILLS WITH OR WITHOUT FEVER  NAUSEA AND VOMITING THAT IS NOT CONTROLLED WITH YOUR NAUSEA MEDICATION  *UNUSUAL SHORTNESS OF BREATH  *UNUSUAL BRUISING OR BLEEDING  TENDERNESS IN MOUTH AND THROAT WITH OR WITHOUT PRESENCE OF ULCERS  *URINARY PROBLEMS  *BOWEL PROBLEMS  UNUSUAL RASH Items with * indicate a potential emergency and should be followed up as soon as possible.  One of the nurses will contact you 24 hours after your treatment. Please let the nurse know about any problems that you may have experienced. Feel free to call the clinic you have any questions or concerns. The clinic phone number is [phone].   I have been informed and understand all the instructions given to me. I know to contact the clinic, my physician, or go to the Emergency Department if any problems should occur. I do not have any questions at this time, but understand that I may call the clinic during office hours   should I have any questions or need assistance in obtaining follow up care.    __________________________________________  _____________  __________ Signature of Patient or Authorized Representative            Date                   Time    __________________________________________ Nurse's Signature    

## 2011-07-16 NOTE — Telephone Encounter (Signed)
Gave pt appt calendar for June, 2013 lab, chemo and ML

## 2011-07-16 NOTE — Telephone Encounter (Signed)
Gave pt appt for ML and lab, emailed Marcelino Duster, regarding chemo

## 2011-07-16 NOTE — Progress Notes (Signed)
   [facility]    OFFICE PROGRESS NOTE   INTERVAL HISTORY:   Taylor [name] returns as scheduled. Taylor [name] completed cycle 4 of FOLFOX on 07/02/2011. Taylor [name] reports nausea following chemotherapy, but no emesis. No diarrhea, mouth sores, or persistent neuropathy symptoms. Cold sensitivity  lasted for several days after chemotherapy.  Objective:  Vital signs in last 24 hours:  Blood pressure 140/77, pulse 81, temperature 98.9 F (37.2 C), temperature source Oral, height 5\' 10"  (1.778 m), weight 184 lb 3.2 oz (83.553 kg).    HEENT: No thrush or ulcers Resp: Lungs clear bilaterally Cardio: Regular rate and rhythm GI: No hepatomegaly, nontender Vascular: No leg edema Neuro: Very mild decrease in vibratory sense at the fingertips    Portacath/PICC-without erythema  Lab Results:  Lab Results  Component Value Date   WBC 5.1 07/16/2011   HGB 15.2 07/16/2011   HCT 43.5 07/16/2011   MCV 95.0 07/16/2011   PLT 91* 07/16/2011   ANC 2.6    Medications: I have reviewed the patient's current medications.  Assessment/Plan: 1. 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 Adjuvant FOLFOX chemotherapy initiated 05/21/2011. 2. CT scan chest 05/15/2011 with no evidence of metastatic disease in the chest. Mild centrilobular and paraseptal emphysematous changes. 3. Normal preoperative CEA (2.4 on 04/10/2011). 4. History of bipolar disease. 5. Right inguinal hernia. Taylor [name] is taking Percocet as needed. 6. Mild thrombocytopenia secondary to chemotherapy. Slightly lower today-Taylor [name] knows to contact us for spontaneous bleeding or bruising 7. ? Early oxaliplatin neuropathy  Disposition:  Taylor [name] appears stable. The plan is to proceed with cycle 5 of adjuvant FOLFOX today. Taylor [name] will return for an office visit and chemotherapy in 2 weeks.   [name], MD  07/16/2011  8:03 PM

## 2011-07-18 ENCOUNTER — Ambulatory Visit (HOSPITAL_BASED_OUTPATIENT_CLINIC_OR_DEPARTMENT_OTHER): Payer: Medicaid Other

## 2011-07-18 VITALS — BP 120/77 | HR 89 | Temp 98.0°F

## 2011-07-18 DIAGNOSIS — C189 Malignant neoplasm of colon, unspecified: Secondary | ICD-10-CM

## 2011-07-18 DIAGNOSIS — C19 Malignant neoplasm of rectosigmoid junction: Secondary | ICD-10-CM

## 2011-07-18 MED ORDER — HEPARIN SOD (PORK) LOCK FLUSH 100 UNIT/ML IV SOLN
500.0000 [IU] | Freq: Once | INTRAVENOUS | Status: AC | PRN
  Administered 2011-07-18: 500 [IU]
  Filled 2011-07-18: qty 5

## 2011-07-18 MED ORDER — SODIUM CHLORIDE 0.9 % IJ SOLN
10.0000 mL | INTRAMUSCULAR | Status: DC | PRN
  Administered 2011-07-18: 10 mL
  Filled 2011-07-18: qty 10

## 2011-07-18 NOTE — Patient Instructions (Signed)
Call MD for problems 

## 2011-07-29 ENCOUNTER — Other Ambulatory Visit: Payer: Self-pay | Admitting: Oncology

## 2011-07-30 ENCOUNTER — Other Ambulatory Visit: Payer: Self-pay | Admitting: *Deleted

## 2011-07-30 ENCOUNTER — Telehealth: Payer: Self-pay | Admitting: *Deleted

## 2011-07-30 ENCOUNTER — Telehealth: Payer: Self-pay | Admitting: Oncology

## 2011-07-30 ENCOUNTER — Ambulatory Visit (HOSPITAL_BASED_OUTPATIENT_CLINIC_OR_DEPARTMENT_OTHER): Payer: Medicaid Other | Admitting: Oncology

## 2011-07-30 ENCOUNTER — Ambulatory Visit (HOSPITAL_BASED_OUTPATIENT_CLINIC_OR_DEPARTMENT_OTHER): Payer: Medicaid Other

## 2011-07-30 ENCOUNTER — Other Ambulatory Visit (HOSPITAL_BASED_OUTPATIENT_CLINIC_OR_DEPARTMENT_OTHER): Payer: Medicaid Other | Admitting: Lab

## 2011-07-30 VITALS — BP 135/84 | HR 78 | Temp 99.5°F | Ht 70.0 in | Wt 186.3 lb

## 2011-07-30 DIAGNOSIS — T451X5A Adverse effect of antineoplastic and immunosuppressive drugs, initial encounter: Secondary | ICD-10-CM

## 2011-07-30 DIAGNOSIS — C189 Malignant neoplasm of colon, unspecified: Secondary | ICD-10-CM

## 2011-07-30 DIAGNOSIS — C19 Malignant neoplasm of rectosigmoid junction: Secondary | ICD-10-CM

## 2011-07-30 DIAGNOSIS — Z5111 Encounter for antineoplastic chemotherapy: Secondary | ICD-10-CM

## 2011-07-30 DIAGNOSIS — D6959 Other secondary thrombocytopenia: Secondary | ICD-10-CM

## 2011-07-30 LAB — COMPREHENSIVE METABOLIC PANEL
Albumin: 3.8 g/dL (ref 3.5–5.2)
Alkaline Phosphatase: 94 U/L (ref 39–117)
BUN: 9 mg/dL (ref 6–23)
CO2: 28 mEq/L (ref 19–32)
Glucose, Bld: 81 mg/dL (ref 70–99)
Potassium: 4.1 mEq/L (ref 3.5–5.3)
Sodium: 136 mEq/L (ref 135–145)
Total Protein: 7 g/dL (ref 6.0–8.3)

## 2011-07-30 LAB — CBC WITH DIFFERENTIAL/PLATELET
Basophils Absolute: 0 10*3/uL (ref 0.0–0.1)
EOS%: 3.6 % (ref 0.0–7.0)
Eosinophils Absolute: 0.2 10*3/uL (ref 0.0–0.5)
HCT: 43.2 % (ref 38.4–49.9)
HGB: 15 g/dL (ref 13.0–17.1)
MCH: 33.6 pg — ABNORMAL HIGH (ref 27.2–33.4)
MCV: 96.9 fL (ref 79.3–98.0)
MONO%: 13.3 % (ref 0.0–14.0)
NEUT#: 2.4 10*3/uL (ref 1.5–6.5)
NEUT%: 51.3 % (ref 39.0–75.0)
RDW: 17.3 % — ABNORMAL HIGH (ref 11.0–14.6)
lymph#: 1.5 10*3/uL (ref 0.9–3.3)

## 2011-07-30 MED ORDER — OXYCODONE-ACETAMINOPHEN 5-325 MG PO TABS
1.0000 | ORAL_TABLET | Freq: Once | ORAL | Status: AC
  Administered 2011-07-30: 1 via ORAL

## 2011-07-30 MED ORDER — ONDANSETRON 8 MG/50ML IVPB (CHCC)
8.0000 mg | Freq: Once | INTRAVENOUS | Status: AC
  Administered 2011-07-30: 8 mg via INTRAVENOUS

## 2011-07-30 MED ORDER — SODIUM CHLORIDE 0.9 % IV SOLN
2400.0000 mg/m2 | INTRAVENOUS | Status: DC
  Administered 2011-07-30: 4900 mg via INTRAVENOUS
  Filled 2011-07-30: qty 98

## 2011-07-30 MED ORDER — LEUCOVORIN CALCIUM INJECTION 350 MG
400.0000 mg/m2 | Freq: Once | INTRAVENOUS | Status: AC
  Administered 2011-07-30: 816 mg via INTRAVENOUS
  Filled 2011-07-30: qty 40.8

## 2011-07-30 MED ORDER — DEXAMETHASONE SODIUM PHOSPHATE 10 MG/ML IJ SOLN
10.0000 mg | Freq: Once | INTRAMUSCULAR | Status: AC
  Administered 2011-07-30: 10 mg via INTRAVENOUS

## 2011-07-30 MED ORDER — OXALIPLATIN CHEMO INJECTION 100 MG/20ML
85.0000 mg/m2 | Freq: Once | INTRAVENOUS | Status: AC
  Administered 2011-07-30: 175 mg via INTRAVENOUS
  Filled 2011-07-30: qty 35

## 2011-07-30 MED ORDER — DEXTROSE 5 % IV SOLN
Freq: Once | INTRAVENOUS | Status: AC
  Administered 2011-07-30: 10:00:00 via INTRAVENOUS

## 2011-07-30 MED ORDER — OXYCODONE-ACETAMINOPHEN 5-325 MG PO TABS: 1.0000 | ORAL_TABLET | Freq: Two times a day (BID) | ORAL | Status: DC | PRN

## 2011-07-30 MED ORDER — FLUOROURACIL CHEMO INJECTION 2.5 GM/50ML
400.0000 mg/m2 | Freq: Once | INTRAVENOUS | Status: AC
  Administered 2011-07-30: 800 mg via INTRAVENOUS
  Filled 2011-07-30: qty 16

## 2011-07-30 NOTE — Telephone Encounter (Signed)
appts made and printed for pt aom °

## 2011-07-30 NOTE — Telephone Encounter (Signed)
Per staff message I have scheduled appts. JMW  

## 2011-07-30 NOTE — Progress Notes (Signed)
   [facility]    OFFICE PROGRESS NOTE   INTERVAL HISTORY:   He completed another cycle of FOLFOX on 07/16/2011. He reports 1 episode of emesis occurring approximately 1 week after chemotherapy. No mouth sores, diarrhea, or neuropathy symptoms. He continues to have discomfort associated with the right inguinal hernia. He takes 2 Percocet tablets per day for relief of pain.  Objective:  Vital signs in last 24 hours:  Blood pressure 135/84, pulse 78, temperature 99.5 F (37.5 C), temperature source Oral, height 5\' 10"  (1.778 m), weight 186 lb 4.8 oz (84.505 kg).    HEENT: No thrush or ulcers Resp: Bronchial sounds at the upper posterior chest bilaterally, no respiratory distress Cardio: Regular rate and rhythm GI: No hepatomegaly, nontender, no mass, reducible right inguinal hernia Vascular: No leg Neuro: The vibratory sense is intact at the fingertips bilaterally    Portacath/PICC-without erythema  Lab Results:  Lab Results  Component Value Date   WBC 4.7 07/30/2011   HGB 15.0 07/30/2011   HCT 43.2 07/30/2011   MCV 96.9 07/30/2011   PLT 100* 07/30/2011   ANC 2.4    Medications: I have reviewed the patient's current medications.  Assessment/Plan: 1. 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 Adjuvant FOLFOX chemotherapy initiated 05/21/2011. 2. CT scan chest 05/15/2011 with no evidence of metastatic disease in the chest. Mild centrilobular and paraseptal emphysematous changes. 3. Normal preoperative CEA (2.4 on 04/10/2011). 4. History of bipolar disease. 5. Right inguinal hernia. He is taking Percocet as needed. 6. Mild thrombocytopenia secondary to chemotherapy. Stable. 7. ? Early oxaliplatin neuropathy, no neurologic symptoms today   Disposition:  He appears stable. The plan is to proceed with cycle 6 of adjuvant FOLFOX today. He will return for an office visit  and chemotherapy in 2 weeks. I encouraged him to decrease the use of oxycodone as tolerated.   [name], MD  07/30/2011  10:17 AM

## 2011-07-30 NOTE — Patient Instructions (Addendum)
[facility] Discharge Instructions for Patients Receiving Chemotherapy  Today you received the following chemotherapy agents Oxaliplatin, Leucovorin and 5FU  To help prevent nausea and vomiting after your treatment, we encourage you to take your nausea medication as prescribed.   If you develop nausea and vomiting that is not controlled by your nausea medication, call the clinic. If it is after clinic hours your family physician or the after hours number for the clinic or go to the Emergency Department.   BELOW ARE SYMPTOMS THAT SHOULD BE REPORTED IMMEDIATELY:  *FEVER GREATER THAN 100.5 F  *CHILLS WITH OR WITHOUT FEVER  NAUSEA AND VOMITING THAT IS NOT CONTROLLED WITH YOUR NAUSEA MEDICATION  *UNUSUAL SHORTNESS OF BREATH  *UNUSUAL BRUISING OR BLEEDING  TENDERNESS IN MOUTH AND THROAT WITH OR WITHOUT PRESENCE OF ULCERS  *URINARY PROBLEMS  *BOWEL PROBLEMS  UNUSUAL RASH Items with * indicate a potential emergency and should be followed up as soon as possible.  One of the nurses will contact you 24 hours after your treatment. Please let the nurse know about any problems that you may have experienced. Feel free to call the clinic you have any questions or concerns. The clinic phone number is [phone].   I have been informed and understand all the instructions given to me. I know to contact the clinic, my physician, or go to the Emergency Department if any problems should occur. I do not have any questions at this time, but understand that I may call the clinic during office hours   should I have any questions or need assistance in obtaining follow up care.    __________________________________________  _____________  __________ Signature of Patient or Authorized Representative            Date                   Time    __________________________________________ Nurse's Signature    

## 2011-08-01 ENCOUNTER — Telehealth (INDEPENDENT_AMBULATORY_CARE_PROVIDER_SITE_OTHER): Payer: Self-pay | Admitting: General Surgery

## 2011-08-01 ENCOUNTER — Ambulatory Visit (HOSPITAL_BASED_OUTPATIENT_CLINIC_OR_DEPARTMENT_OTHER): Payer: Medicaid Other

## 2011-08-01 VITALS — BP 134/79 | HR 84 | Temp 97.8°F

## 2011-08-01 DIAGNOSIS — C189 Malignant neoplasm of colon, unspecified: Secondary | ICD-10-CM

## 2011-08-01 DIAGNOSIS — C19 Malignant neoplasm of rectosigmoid junction: Secondary | ICD-10-CM

## 2011-08-01 DIAGNOSIS — Z452 Encounter for adjustment and management of vascular access device: Secondary | ICD-10-CM

## 2011-08-01 MED ORDER — SODIUM CHLORIDE 0.9 % IJ SOLN
10.0000 mL | INTRAMUSCULAR | Status: DC | PRN
  Administered 2011-08-01: 10 mL
  Filled 2011-08-01: qty 10

## 2011-08-01 MED ORDER — HEPARIN SOD (PORK) LOCK FLUSH 100 UNIT/ML IV SOLN
500.0000 [IU] | Freq: Once | INTRAVENOUS | Status: AC | PRN
  Administered 2011-08-01: 500 [IU]
  Filled 2011-08-01: qty 5

## 2011-08-01 NOTE — Telephone Encounter (Signed)
Pt calling for Percocet refill.  Dr. Gerrit Friends wrote Rx for pick-up at the front desk.

## 2011-08-12 ENCOUNTER — Other Ambulatory Visit: Payer: Self-pay | Admitting: Oncology

## 2011-08-13 ENCOUNTER — Ambulatory Visit (HOSPITAL_BASED_OUTPATIENT_CLINIC_OR_DEPARTMENT_OTHER): Payer: Medicaid Other

## 2011-08-13 ENCOUNTER — Ambulatory Visit (HOSPITAL_BASED_OUTPATIENT_CLINIC_OR_DEPARTMENT_OTHER): Payer: Medicaid Other | Admitting: Nurse Practitioner

## 2011-08-13 ENCOUNTER — Telehealth: Payer: Self-pay | Admitting: Oncology

## 2011-08-13 ENCOUNTER — Other Ambulatory Visit (HOSPITAL_BASED_OUTPATIENT_CLINIC_OR_DEPARTMENT_OTHER): Payer: Medicaid Other | Admitting: Lab

## 2011-08-13 VITALS — BP 134/71 | HR 83 | Temp 97.2°F | Ht 70.0 in | Wt 186.9 lb

## 2011-08-13 DIAGNOSIS — C189 Malignant neoplasm of colon, unspecified: Secondary | ICD-10-CM

## 2011-08-13 DIAGNOSIS — C19 Malignant neoplasm of rectosigmoid junction: Secondary | ICD-10-CM

## 2011-08-13 DIAGNOSIS — Z5111 Encounter for antineoplastic chemotherapy: Secondary | ICD-10-CM

## 2011-08-13 DIAGNOSIS — D6959 Other secondary thrombocytopenia: Secondary | ICD-10-CM

## 2011-08-13 LAB — CBC WITH DIFFERENTIAL/PLATELET
BASO%: 0.6 % (ref 0.0–2.0)
Basophils Absolute: 0 10*3/uL (ref 0.0–0.1)
EOS%: 3.9 % (ref 0.0–7.0)
HCT: 43.2 % (ref 38.4–49.9)
HGB: 15 g/dL (ref 13.0–17.1)
LYMPH%: 37.5 % (ref 14.0–49.0)
MCH: 34.2 pg — ABNORMAL HIGH (ref 27.2–33.4)
MCHC: 34.7 g/dL (ref 32.0–36.0)
NEUT%: 45.8 % (ref 39.0–75.0)
Platelets: 90 10*3/uL — ABNORMAL LOW (ref 140–400)
lymph#: 1.8 10*3/uL (ref 0.9–3.3)

## 2011-08-13 LAB — COMPREHENSIVE METABOLIC PANEL
ALT: 25 U/L (ref 0–53)
AST: 38 U/L — ABNORMAL HIGH (ref 0–37)
BUN: 8 mg/dL (ref 6–23)
CO2: 26 mEq/L (ref 19–32)
Calcium: 8.7 mg/dL (ref 8.4–10.5)
Chloride: 99 mEq/L (ref 96–112)
Creatinine, Ser: 0.8 mg/dL (ref 0.50–1.35)
Total Bilirubin: 0.2 mg/dL — ABNORMAL LOW (ref 0.3–1.2)

## 2011-08-13 MED ORDER — DEXAMETHASONE SODIUM PHOSPHATE 10 MG/ML IJ SOLN
10.0000 mg | Freq: Once | INTRAMUSCULAR | Status: AC
  Administered 2011-08-13: 10 mg via INTRAVENOUS

## 2011-08-13 MED ORDER — LEUCOVORIN CALCIUM INJECTION 350 MG
400.0000 mg/m2 | Freq: Once | INTRAVENOUS | Status: AC
  Administered 2011-08-13: 816 mg via INTRAVENOUS
  Filled 2011-08-13: qty 40.8

## 2011-08-13 MED ORDER — SODIUM CHLORIDE 0.9 % IJ SOLN
10.0000 mL | INTRAMUSCULAR | Status: DC | PRN
  Filled 2011-08-13: qty 10

## 2011-08-13 MED ORDER — HEPARIN SOD (PORK) LOCK FLUSH 100 UNIT/ML IV SOLN
500.0000 [IU] | Freq: Once | INTRAVENOUS | Status: DC | PRN
  Filled 2011-08-13: qty 5

## 2011-08-13 MED ORDER — DEXTROSE 5 % IV SOLN
Freq: Once | INTRAVENOUS | Status: AC
  Administered 2011-08-13: 12:00:00 via INTRAVENOUS

## 2011-08-13 MED ORDER — OXALIPLATIN CHEMO INJECTION 100 MG/20ML
85.0000 mg/m2 | Freq: Once | INTRAVENOUS | Status: AC
  Administered 2011-08-13: 175 mg via INTRAVENOUS
  Filled 2011-08-13: qty 35

## 2011-08-13 MED ORDER — FLUOROURACIL CHEMO INJECTION 2.5 GM/50ML
400.0000 mg/m2 | Freq: Once | INTRAVENOUS | Status: AC
  Administered 2011-08-13: 800 mg via INTRAVENOUS
  Filled 2011-08-13: qty 16

## 2011-08-13 MED ORDER — SODIUM CHLORIDE 0.9 % IV SOLN
2400.0000 mg/m2 | INTRAVENOUS | Status: DC
  Administered 2011-08-13: 4900 mg via INTRAVENOUS
  Filled 2011-08-13: qty 98

## 2011-08-13 MED ORDER — ONDANSETRON 8 MG/50ML IVPB (CHCC)
8.0000 mg | Freq: Once | INTRAVENOUS | Status: AC
  Administered 2011-08-13: 8 mg via INTRAVENOUS

## 2011-08-13 NOTE — Progress Notes (Signed)
OFFICE PROGRESS NOTE  Interval history:  Mr. Sommerville returns as scheduled. He completed cycle 6 FOLFOX on 07/30/2011. He denies nausea/vomiting following the chemotherapy. He has periodic mild nausea which he feels is unrelated to the chemotherapy. No mouth sores. No diarrhea. He had cold sensitivity for a few days after the chemotherapy. He denies persistent neuropathy symptoms. He continues to have discomfort associated with the inguinal hernia. He is taking Percocet 2 times daily.   Objective: Blood pressure 134/71, pulse 83, temperature 97.2 F (36.2 C), temperature source Oral, height 5\' 10"  (1.778 m), weight 186 lb 14.4 oz (84.777 kg).  Oropharynx is without thrush or ulceration. Lungs are clear. No wheezes or rales. Regular cardiac rhythm. Port-A-Cath site is without erythema. Abdomen is soft and nontender. No hepatomegaly. Extremities are without edema. Calves nontender. Vibratory sense mildly decreased over the fingertips on the right hand and normal over the fingertips on the left hand.  Lab Results: Lab Results  Component Value Date   WBC 4.9 08/13/2011   HGB 15.0 08/13/2011   HCT 43.2 08/13/2011   MCV 98.6* 08/13/2011   PLT 90* 08/13/2011    Chemistry:    Chemistry      Component Value Date/Time   NA 136 07/30/2011 0828   K 4.1 07/30/2011 0828   CL 101 07/30/2011 0828   CO2 28 07/30/2011 0828   BUN 9 07/30/2011 0828   CREATININE 0.83 07/30/2011 0828      Component Value Date/Time   CALCIUM 8.9 07/30/2011 0828   ALKPHOS 94 07/30/2011 0828   AST 38* 07/30/2011 0828   ALT 29 07/30/2011 0828   BILITOT 0.3 07/30/2011 0828       Studies/Results: No results found.  Medications: I have reviewed the patient's current medications.  Assessment/Plan:  1. 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 Adjuvant FOLFOX chemotherapy initiated 05/21/2011. 2. CT scan chest 05/15/2011 with no evidence of  metastatic disease in the chest. Mild centrilobular and paraseptal emphysematous changes. 3. Normal preoperative CEA (2.4 on 04/10/2011). 4. History of bipolar disease. 5. Right inguinal hernia. He is taking Percocet as needed. 6. Mild thrombocytopenia secondary to chemotherapy. Stable. 7. ? Early oxaliplatin neuropathy with mild decrease in vibratory sense over the fingertips.  Disposition-Mr. Trageser appears stable. Plan to proceed with cycle 7 of adjuvant FOLFOX today as scheduled. He will return for a followup visit and cycle 8 in 2 weeks. He will contact the office in the interim with any problems.  Plan reviewed with Dr. Truett Perna.  Lonna Cobb ANP/GNP-BC

## 2011-08-13 NOTE — Progress Notes (Signed)
Per Lonna Cobb treat despite low platelets.

## 2011-08-13 NOTE — Patient Instructions (Addendum)
[facility] Discharge Instructions for Patients Receiving Chemotherapy  Today you received the following chemotherapy agents Oxaliplatin, Leucovorin, Fluorouracil  To help prevent nausea and vomiting after your treatment, we encourage you to take your nausea medication Begin taking it at 7 pm and take it as often as prescribed for the next 24 to 72 hours.   If you develop nausea and vomiting that is not controlled by your nausea medication, call the clinic. If it is after clinic hours your family physician or the after hours number for the clinic or go to the Emergency Department.   BELOW ARE SYMPTOMS THAT SHOULD BE REPORTED IMMEDIATELY:  *FEVER GREATER THAN 100.5 F  *CHILLS WITH OR WITHOUT FEVER  NAUSEA AND VOMITING THAT IS NOT CONTROLLED WITH YOUR NAUSEA MEDICATION  *UNUSUAL SHORTNESS OF BREATH  *UNUSUAL BRUISING OR BLEEDING  TENDERNESS IN MOUTH AND THROAT WITH OR WITHOUT PRESENCE OF ULCERS  *URINARY PROBLEMS  *BOWEL PROBLEMS  UNUSUAL RASH Items with * indicate a potential emergency and should be followed up as soon as possible.  One of the nurses will contact you 24 hours after your treatment. Please let the nurse know about any problems that you may have experienced. Feel free to call the clinic you have any questions or concerns. The clinic phone number is [phone].   I have been informed and understand all the instructions given to me. I know to contact the clinic, my physician, or go to the Emergency Department if any problems should occur. I do not have any questions at this time, but understand that I may call the clinic during office hours   should I have any questions or need assistance in obtaining follow up care.    __________________________________________  _____________  __________ Signature of Patient or Authorized Representative            Date                   Time    __________________________________________ Nurse's  Signature

## 2011-08-13 NOTE — Telephone Encounter (Signed)
gv pt appt schedule for July/August. °

## 2011-08-15 ENCOUNTER — Ambulatory Visit (HOSPITAL_BASED_OUTPATIENT_CLINIC_OR_DEPARTMENT_OTHER): Payer: Medicaid Other

## 2011-08-15 DIAGNOSIS — Z452 Encounter for adjustment and management of vascular access device: Secondary | ICD-10-CM

## 2011-08-15 DIAGNOSIS — C19 Malignant neoplasm of rectosigmoid junction: Secondary | ICD-10-CM

## 2011-08-15 DIAGNOSIS — C189 Malignant neoplasm of colon, unspecified: Secondary | ICD-10-CM

## 2011-08-15 MED ORDER — SODIUM CHLORIDE 0.9 % IJ SOLN
10.0000 mL | INTRAMUSCULAR | Status: DC | PRN
  Administered 2011-08-15: 10 mL
  Filled 2011-08-15: qty 10

## 2011-08-15 MED ORDER — HEPARIN SOD (PORK) LOCK FLUSH 100 UNIT/ML IV SOLN
500.0000 [IU] | Freq: Once | INTRAVENOUS | Status: AC | PRN
  Administered 2011-08-15: 500 [IU]
  Filled 2011-08-15: qty 5

## 2011-08-15 NOTE — Patient Instructions (Signed)
Call MD for problems 

## 2011-08-25 ENCOUNTER — Other Ambulatory Visit: Payer: Self-pay | Admitting: Oncology

## 2011-08-27 ENCOUNTER — Other Ambulatory Visit: Payer: Self-pay | Admitting: *Deleted

## 2011-08-27 ENCOUNTER — Telehealth: Payer: Self-pay | Admitting: *Deleted

## 2011-08-27 ENCOUNTER — Ambulatory Visit (HOSPITAL_BASED_OUTPATIENT_CLINIC_OR_DEPARTMENT_OTHER): Payer: Medicaid Other | Admitting: Oncology

## 2011-08-27 ENCOUNTER — Other Ambulatory Visit (HOSPITAL_BASED_OUTPATIENT_CLINIC_OR_DEPARTMENT_OTHER): Payer: Medicaid Other | Admitting: Lab

## 2011-08-27 ENCOUNTER — Ambulatory Visit (HOSPITAL_BASED_OUTPATIENT_CLINIC_OR_DEPARTMENT_OTHER): Payer: Medicaid Other

## 2011-08-27 VITALS — BP 121/75 | HR 79 | Temp 97.0°F | Resp 20 | Ht 70.0 in | Wt 184.9 lb

## 2011-08-27 DIAGNOSIS — K4090 Unilateral inguinal hernia, without obstruction or gangrene, not specified as recurrent: Secondary | ICD-10-CM

## 2011-08-27 DIAGNOSIS — C19 Malignant neoplasm of rectosigmoid junction: Secondary | ICD-10-CM

## 2011-08-27 DIAGNOSIS — Z5111 Encounter for antineoplastic chemotherapy: Secondary | ICD-10-CM

## 2011-08-27 DIAGNOSIS — Z452 Encounter for adjustment and management of vascular access device: Secondary | ICD-10-CM

## 2011-08-27 DIAGNOSIS — C189 Malignant neoplasm of colon, unspecified: Secondary | ICD-10-CM

## 2011-08-27 DIAGNOSIS — D696 Thrombocytopenia, unspecified: Secondary | ICD-10-CM

## 2011-08-27 DIAGNOSIS — F319 Bipolar disorder, unspecified: Secondary | ICD-10-CM

## 2011-08-27 DIAGNOSIS — R52 Pain, unspecified: Secondary | ICD-10-CM

## 2011-08-27 LAB — COMPREHENSIVE METABOLIC PANEL
AST: 35 U/L (ref 0–37)
Albumin: 3.5 g/dL (ref 3.5–5.2)
BUN: 9 mg/dL (ref 6–23)
CO2: 25 mEq/L (ref 19–32)
Calcium: 8.8 mg/dL (ref 8.4–10.5)
Chloride: 102 mEq/L (ref 96–112)
Creatinine, Ser: 0.96 mg/dL (ref 0.50–1.35)
Potassium: 3.9 mEq/L (ref 3.5–5.3)

## 2011-08-27 LAB — CBC WITH DIFFERENTIAL/PLATELET
Basophils Absolute: 0 10*3/uL (ref 0.0–0.1)
EOS%: 2.9 % (ref 0.0–7.0)
Eosinophils Absolute: 0.1 10*3/uL (ref 0.0–0.5)
HCT: 44.1 % (ref 38.4–49.9)
HGB: 15 g/dL (ref 13.0–17.1)
MONO#: 0.8 10*3/uL (ref 0.1–0.9)
NEUT#: 2.5 10*3/uL (ref 1.5–6.5)
NEUT%: 52.6 % (ref 39.0–75.0)
RDW: 18.7 % — ABNORMAL HIGH (ref 11.0–14.6)
WBC: 4.7 10*3/uL (ref 4.0–10.3)
lymph#: 1.3 10*3/uL (ref 0.9–3.3)

## 2011-08-27 MED ORDER — SODIUM CHLORIDE 0.9 % IJ SOLN
10.0000 mL | INTRAMUSCULAR | Status: DC | PRN
  Filled 2011-08-27: qty 10

## 2011-08-27 MED ORDER — SODIUM CHLORIDE 0.9 % IV SOLN
2400.0000 mg/m2 | INTRAVENOUS | Status: DC
  Administered 2011-08-27: 4900 mg via INTRAVENOUS
  Filled 2011-08-27: qty 98

## 2011-08-27 MED ORDER — ALTEPLASE 2 MG IJ SOLR
2.0000 mg | Freq: Once | INTRAMUSCULAR | Status: AC | PRN
  Administered 2011-08-27: 2 mg
  Filled 2011-08-27: qty 2

## 2011-08-27 MED ORDER — OXYCODONE-ACETAMINOPHEN 5-325 MG PO TABS
1.0000 | ORAL_TABLET | Freq: Once | ORAL | Status: AC
  Administered 2011-08-27: 1 via ORAL

## 2011-08-27 MED ORDER — FLUOROURACIL CHEMO INJECTION 2.5 GM/50ML
400.0000 mg/m2 | Freq: Once | INTRAVENOUS | Status: AC
  Administered 2011-08-27: 800 mg via INTRAVENOUS
  Filled 2011-08-27: qty 16

## 2011-08-27 MED ORDER — OXYCODONE-ACETAMINOPHEN 5-325 MG PO TABS: 1.0000 | ORAL_TABLET | Freq: Two times a day (BID) | ORAL | Status: DC | PRN

## 2011-08-27 MED ORDER — OXYCODONE-ACETAMINOPHEN 5-325 MG PO TABS: 1.0000 | ORAL_TABLET | ORAL | Status: DC | PRN

## 2011-08-27 MED ORDER — LEUCOVORIN CALCIUM INJECTION 350 MG
400.0000 mg/m2 | Freq: Once | INTRAVENOUS | Status: AC
  Administered 2011-08-27: 816 mg via INTRAVENOUS
  Filled 2011-08-27: qty 40.8

## 2011-08-27 MED ORDER — DEXTROSE 5 % IV SOLN
Freq: Once | INTRAVENOUS | Status: AC
  Administered 2011-08-27: 10:00:00 via INTRAVENOUS

## 2011-08-27 NOTE — Patient Instructions (Addendum)
[facility] Discharge Instructions for Patients Receiving Chemotherapy  Today you received the following chemotherapy agents Leucovorin, 5FU To help prevent nausea and vomiting after your treatment, we encourage you to take your nausea medication. Begin taking it at 4pm and take it as often as prescribed for the next 24-72 hours.   If you develop nausea and vomiting that is not controlled by your nausea medication, call the clinic. If it is after clinic hours your family physician or the after hours number for the clinic or go to the Emergency Department.   BELOW ARE SYMPTOMS THAT SHOULD BE REPORTED IMMEDIATELY:  *FEVER GREATER THAN 100.5 F  *CHILLS WITH OR WITHOUT FEVER  NAUSEA AND VOMITING THAT IS NOT CONTROLLED WITH YOUR NAUSEA MEDICATION  *UNUSUAL SHORTNESS OF BREATH  *UNUSUAL BRUISING OR BLEEDING  TENDERNESS IN MOUTH AND THROAT WITH OR WITHOUT PRESENCE OF ULCERS  *URINARY PROBLEMS  *BOWEL PROBLEMS  UNUSUAL RASH Items with * indicate a potential emergency and should be followed up as soon as possible.  . Please let the nurse know about any problems that you may have experienced. Feel free to call the clinic you have any questions or concerns. The clinic phone number is [phone].   I have been informed and understand all the instructions given to me. I know to contact the clinic, my physician, or go to the Emergency Department if any problems should occur. I do not have any questions at this time, but understand that I may call the clinic during office hours   should I have any questions or need assistance in obtaining follow up care.    __________________________________________  _____________  __________ Signature of Patient or Authorized Representative            Date                   Time    __________________________________________ Nurse's Signature

## 2011-08-27 NOTE — Telephone Encounter (Signed)
Gave patient appointment for 09-24-2011 Rosalie Doctor folfox and 09-26-2011 pump d/c

## 2011-08-27 NOTE — Progress Notes (Signed)
   [facility]    OFFICE PROGRESS NOTE   INTERVAL HISTORY:   He returns as scheduled. He completed another cycle of chemotherapy on 08/13/2011. He reports cold sensitivity for 3 or 4 days after chemotherapy. No other neuropathy symptoms. No mouth sores, nausea, or diarrhea. He continues to have intermittent pain at the right inguinal hernia. He takes approximately 2 Percocet tablets per day for relief of pain.  Objective:  Vital signs in last 24 hours:  Blood pressure 121/75, pulse 79, temperature 97 F (36.1 C), resp. rate 20, height 5\' 10"  (1.778 m), weight 184 lb 14.4 oz (83.87 kg).    HEENT: No thrush or ulcers Resp: Lungs clear bilaterally, distant breath sounds Cardio: Regular rate and rhythm GI: Nontender, no hepatomegaly, reducible right inguinal hernia Vascular: No leg edema Neuro: The vibratory sense is intact at the fingertips bilaterally    Portacath/PICC-without erythema  Lab Results:  Lab Results  Component Value Date   WBC 4.7 08/27/2011   HGB 15.0 08/27/2011   HCT 44.1 08/27/2011   MCV 102.2* 08/27/2011   PLT 72* 08/27/2011   ANC 2.5   Medications: I have reviewed the patient's current medications.  Assessment/Plan: 1. 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 Adjuvant FOLFOX chemotherapy initiated 05/21/2011. 2. CT scan chest 05/15/2011 with no evidence of metastatic disease in the chest. Mild centrilobular and paraseptal emphysematous changes. 3. Normal preoperative CEA (2.4 on 04/10/2011). 4. History of bipolar disease. 5. Right inguinal hernia. He is taking Percocet as needed. 6. Mild thrombocytopenia secondary to chemotherapy. Progressive since the last cycle of chemotherapy. 7. ? Early oxaliplatin neuropathy with mild decrease in vibratory sense over the fingertips on exam 08/13/2011, normal today  Disposition:  He appears stable. The  platelet count is lower today. Oxaliplatin will be held with cycle 8 FOLFOX. He will return for an office visit and cycle 9 FOLFOX in 2 weeks.   [name], MD  08/27/2011  9:19 AM

## 2011-08-27 NOTE — Telephone Encounter (Signed)
Dayton Scrape copy of the treatment information patient needing folfox on 09-24-2011 09-26-2011 pump d/c

## 2011-08-27 NOTE — Addendum Note (Signed)
Addended by: Wandalee Ferdinand on: 08/27/2011 09:40 AM   Modules accepted: Orders, Medications

## 2011-08-27 NOTE — Progress Notes (Signed)
Ok to treat per Dr. Truett Perna. Despite lab counts. Will hold Oxaliplatin this cycle. Pt verbalized understanding of change in tx plan.

## 2011-08-29 ENCOUNTER — Ambulatory Visit (HOSPITAL_BASED_OUTPATIENT_CLINIC_OR_DEPARTMENT_OTHER): Payer: Medicaid Other

## 2011-08-29 VITALS — BP 111/76 | HR 87 | Temp 98.3°F

## 2011-08-29 DIAGNOSIS — Z452 Encounter for adjustment and management of vascular access device: Secondary | ICD-10-CM

## 2011-08-29 DIAGNOSIS — C189 Malignant neoplasm of colon, unspecified: Secondary | ICD-10-CM

## 2011-08-29 DIAGNOSIS — C19 Malignant neoplasm of rectosigmoid junction: Secondary | ICD-10-CM

## 2011-08-29 MED ORDER — SODIUM CHLORIDE 0.9 % IJ SOLN
10.0000 mL | INTRAMUSCULAR | Status: DC | PRN
  Administered 2011-08-29: 10 mL
  Filled 2011-08-29: qty 10

## 2011-08-29 MED ORDER — HEPARIN SOD (PORK) LOCK FLUSH 100 UNIT/ML IV SOLN
500.0000 [IU] | Freq: Once | INTRAVENOUS | Status: AC | PRN
  Administered 2011-08-29: 500 [IU]
  Filled 2011-08-29: qty 5

## 2011-08-29 NOTE — Patient Instructions (Signed)
Call MD with any problems 

## 2011-09-02 ENCOUNTER — Telehealth: Payer: Self-pay | Admitting: *Deleted

## 2011-09-02 NOTE — Telephone Encounter (Signed)
Per POF I have scheduled appts. JMW  

## 2011-09-08 ENCOUNTER — Other Ambulatory Visit: Payer: Self-pay | Admitting: Oncology

## 2011-09-09 ENCOUNTER — Telehealth (INDEPENDENT_AMBULATORY_CARE_PROVIDER_SITE_OTHER): Payer: Self-pay | Admitting: General Surgery

## 2011-09-09 NOTE — Telephone Encounter (Signed)
Pt walked-in to request pain meds refill.  Paged Dr. Gerrit Friends and undated him; OKd Percocet 5/325 mg,  # 30, 1-2 po Q 4-6 H prn pain, no refill.  Dr. Derrell Lolling signed Rx in office for pt.

## 2011-09-10 ENCOUNTER — Telehealth: Payer: Self-pay | Admitting: *Deleted

## 2011-09-10 ENCOUNTER — Ambulatory Visit (HOSPITAL_BASED_OUTPATIENT_CLINIC_OR_DEPARTMENT_OTHER): Payer: Medicaid Other

## 2011-09-10 ENCOUNTER — Other Ambulatory Visit (HOSPITAL_BASED_OUTPATIENT_CLINIC_OR_DEPARTMENT_OTHER): Payer: Medicaid Other | Admitting: Lab

## 2011-09-10 ENCOUNTER — Telehealth: Payer: Self-pay | Admitting: Oncology

## 2011-09-10 ENCOUNTER — Other Ambulatory Visit: Payer: Self-pay | Admitting: *Deleted

## 2011-09-10 ENCOUNTER — Ambulatory Visit (HOSPITAL_BASED_OUTPATIENT_CLINIC_OR_DEPARTMENT_OTHER): Payer: Medicaid Other | Admitting: Oncology

## 2011-09-10 VITALS — BP 136/82 | HR 77 | Temp 98.3°F | Resp 18 | Ht 70.0 in | Wt 187.9 lb

## 2011-09-10 DIAGNOSIS — C189 Malignant neoplasm of colon, unspecified: Secondary | ICD-10-CM

## 2011-09-10 DIAGNOSIS — Z5111 Encounter for antineoplastic chemotherapy: Secondary | ICD-10-CM

## 2011-09-10 DIAGNOSIS — C19 Malignant neoplasm of rectosigmoid junction: Secondary | ICD-10-CM

## 2011-09-10 LAB — COMPREHENSIVE METABOLIC PANEL
ALT: 34 U/L (ref 0–53)
AST: 46 U/L — ABNORMAL HIGH (ref 0–37)
Albumin: 3.7 g/dL (ref 3.5–5.2)
Alkaline Phosphatase: 97 U/L (ref 39–117)
BUN: 13 mg/dL (ref 6–23)
Calcium: 8.8 mg/dL (ref 8.4–10.5)
Chloride: 104 mEq/L (ref 96–112)
Potassium: 4 mEq/L (ref 3.5–5.3)
Sodium: 139 mEq/L (ref 135–145)
Total Protein: 6.6 g/dL (ref 6.0–8.3)

## 2011-09-10 LAB — CBC WITH DIFFERENTIAL/PLATELET
BASO%: 0.4 % (ref 0.0–2.0)
EOS%: 3.8 % (ref 0.0–7.0)
HCT: 43.6 % (ref 38.4–49.9)
MCH: 34.6 pg — ABNORMAL HIGH (ref 27.2–33.4)
MCHC: 34.2 g/dL (ref 32.0–36.0)
MONO#: 0.7 10*3/uL (ref 0.1–0.9)
RBC: 4.31 10*6/uL (ref 4.20–5.82)
RDW: 16 % — ABNORMAL HIGH (ref 11.0–14.6)
WBC: 5.1 10*3/uL (ref 4.0–10.3)
lymph#: 1.8 10*3/uL (ref 0.9–3.3)
nRBC: 0 % (ref 0–0)

## 2011-09-10 MED ORDER — OXYCODONE-ACETAMINOPHEN 5-325 MG PO TABS
1.0000 | ORAL_TABLET | Freq: Once | ORAL | Status: AC
  Administered 2011-09-10: 1 via ORAL

## 2011-09-10 MED ORDER — LEUCOVORIN CALCIUM INJECTION 350 MG
400.0000 mg/m2 | Freq: Once | INTRAMUSCULAR | Status: AC
  Administered 2011-09-10: 816 mg via INTRAVENOUS
  Filled 2011-09-10: qty 40.8

## 2011-09-10 MED ORDER — FLUOROURACIL CHEMO INJECTION 2.5 GM/50ML
400.0000 mg/m2 | Freq: Once | INTRAVENOUS | Status: AC
  Administered 2011-09-10: 800 mg via INTRAVENOUS
  Filled 2011-09-10: qty 16

## 2011-09-10 MED ORDER — SODIUM CHLORIDE 0.9 % IV SOLN
2400.0000 mg/m2 | INTRAVENOUS | Status: DC
  Administered 2011-09-10: 4900 mg via INTRAVENOUS
  Filled 2011-09-10: qty 98

## 2011-09-10 MED ORDER — DEXTROSE 5 % IV SOLN
Freq: Once | INTRAVENOUS | Status: AC
  Administered 2011-09-10: 11:00:00 via INTRAVENOUS

## 2011-09-10 MED ORDER — DEXAMETHASONE SODIUM PHOSPHATE 10 MG/ML IJ SOLN
10.0000 mg | Freq: Once | INTRAMUSCULAR | Status: AC
  Administered 2011-09-10: 10 mg via INTRAVENOUS

## 2011-09-10 MED ORDER — OXALIPLATIN CHEMO INJECTION 100 MG/20ML
85.0000 mg/m2 | Freq: Once | INTRAVENOUS | Status: AC
  Administered 2011-09-10: 175 mg via INTRAVENOUS
  Filled 2011-09-10: qty 35

## 2011-09-10 MED ORDER — ONDANSETRON 8 MG/50ML IVPB (CHCC)
8.0000 mg | Freq: Once | INTRAVENOUS | Status: AC
  Administered 2011-09-10: 8 mg via INTRAVENOUS

## 2011-09-10 NOTE — Telephone Encounter (Signed)
Gave pt appt for September 2013 lab and ML,chemo, Engineer, water for future chemo orders

## 2011-09-10 NOTE — Telephone Encounter (Signed)
Per staff message and POF I have scheduled appt.  JMW  

## 2011-09-10 NOTE — Patient Instructions (Signed)
[facility] Discharge Instructions for Patients Receiving Chemotherapy  Today you received the following chemotherapy agents Oxaliplatin, Leucovorin and Adrucil.  To help prevent nausea and vomiting after your treatment, we encourage you to take your nausea medication. Begin taking the nausea medication as often as prescribed for by Dr. Truett Perna.    If you develop nausea and vomiting that is not controlled by your nausea medication, call the clinic. If it is after clinic hours your family physician or the after hours number for the clinic or go to the Emergency Department.   BELOW ARE SYMPTOMS THAT SHOULD BE REPORTED IMMEDIATELY:  *FEVER GREATER THAN 100.5 F  *CHILLS WITH OR WITHOUT FEVER  NAUSEA AND VOMITING THAT IS NOT CONTROLLED WITH YOUR NAUSEA MEDICATION  *UNUSUAL SHORTNESS OF BREATH  *UNUSUAL BRUISING OR BLEEDING  TENDERNESS IN MOUTH AND THROAT WITH OR WITHOUT PRESENCE OF ULCERS  *URINARY PROBLEMS  *BOWEL PROBLEMS  UNUSUAL RASH Items with * indicate a potential emergency and should be followed up as soon as possible.  One of the nurses will contact you 24 hours after your treatment. Please let the nurse know about any problems that you may have experienced. Feel free to call the clinic you have any questions or concerns. The clinic phone number is [phone].   I have been informed and understand all the instructions given to me. I know to contact the clinic, my physician, or go to the Emergency Department if any problems should occur. I do not have any questions at this time, but understand that I may call the clinic during office hours   should I have any questions or need assistance in obtaining follow up care.    __________________________________________  _____________  __________ Signature of Patient or Authorized Representative            Date                   Time    __________________________________________ Nurse's Signature

## 2011-09-10 NOTE — Progress Notes (Signed)
   [facility]    OFFICE PROGRESS NOTE   INTERVAL HISTORY:   He returns as scheduled. He completed another cycle of chemotherapy on 08/27/2011. He tolerated the chemotherapy well. No mouth sores. He reports mild nausea in the mornings. He has developed a "rash "over the legs. No neuropathy symptoms.  Objective:  Vital signs in last 24 hours:  Blood pressure 136/82, pulse 77, temperature 98.3 F (36.8 C), temperature source Oral, resp. rate 18, height 5\' 10"  (1.778 m), weight 187 lb 14.4 oz (85.231 kg).    HEENT: No thrush or ulcer Resp: Lungs clear bilaterally Cardio: Regular rate and rhythm GI: No hepatomegaly, nontender, right inguinal hernia Vascular: No leg edema  Skin: Few cherry moles and brown/black 1-2 mm moles over the thighs and trunk.  Neurologic: The vibratory sense is intact at the fingertip bilaterally  Portacath/PICC-without erythema  Lab Results:  Lab Results  Component Value Date   WBC 5.1 09/10/2011   HGB 14.9 09/10/2011   HCT 43.6 09/10/2011   MCV 101.2* 09/10/2011   PLT 94* 09/10/2011   ANC 2.3    Medications: I have reviewed the patient's current medications.  Assessment/Plan: 1. 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 Adjuvant FOLFOX chemotherapy initiated 05/21/2011. 2. CT scan chest 05/15/2011 with no evidence of metastatic disease in the chest. Mild centrilobular and paraseptal emphysematous changes. 3. Normal preoperative CEA (2.4 on 04/10/2011). 4. History of bipolar disease. 5. Right inguinal hernia. He is taking Percocet as needed. 6. Mild thrombocytopenia secondary to chemotherapy. Improved today.  Disposition:  He has completed 8 cycles of adjuvant therapy. Oxaliplatin was held with cycle 8 of FOLFOX. The oxaliplatin will be given with cycle 9 today. He will return for an office visit and chemotherapy in 2 weeks. He knows  to contact us for spontaneous bleeding or bruising.   [name], MD  09/10/2011  6:06 PM

## 2011-09-10 NOTE — Telephone Encounter (Signed)
Pt requested refill of Percocet while in office today. Noted in chart he was given #30 at surgeon's office. Informed pt that Percocet will not be refilled due to this. His response, "OK."

## 2011-09-12 ENCOUNTER — Ambulatory Visit (HOSPITAL_BASED_OUTPATIENT_CLINIC_OR_DEPARTMENT_OTHER): Payer: Medicaid Other

## 2011-09-12 VITALS — BP 131/85 | HR 81 | Temp 97.8°F

## 2011-09-12 DIAGNOSIS — C189 Malignant neoplasm of colon, unspecified: Secondary | ICD-10-CM

## 2011-09-12 DIAGNOSIS — C19 Malignant neoplasm of rectosigmoid junction: Secondary | ICD-10-CM

## 2011-09-12 MED ORDER — SODIUM CHLORIDE 0.9 % IJ SOLN
10.0000 mL | INTRAMUSCULAR | Status: DC | PRN
  Administered 2011-09-12: 10 mL
  Filled 2011-09-12: qty 10

## 2011-09-12 MED ORDER — HEPARIN SOD (PORK) LOCK FLUSH 100 UNIT/ML IV SOLN
500.0000 [IU] | Freq: Once | INTRAVENOUS | Status: AC | PRN
  Administered 2011-09-12: 500 [IU]
  Filled 2011-09-12: qty 5

## 2011-09-12 NOTE — Patient Instructions (Signed)
Call MD for problems 

## 2011-09-20 ENCOUNTER — Telehealth: Payer: Self-pay | Admitting: Oncology

## 2011-09-20 NOTE — Telephone Encounter (Signed)
Called pt and left message regarding appt on 09/24/11 lab, ML and chemo

## 2011-09-23 ENCOUNTER — Other Ambulatory Visit: Payer: Self-pay | Admitting: Oncology

## 2011-09-24 ENCOUNTER — Other Ambulatory Visit (HOSPITAL_BASED_OUTPATIENT_CLINIC_OR_DEPARTMENT_OTHER): Payer: Medicaid Other

## 2011-09-24 ENCOUNTER — Ambulatory Visit (HOSPITAL_BASED_OUTPATIENT_CLINIC_OR_DEPARTMENT_OTHER): Payer: Medicaid Other | Admitting: Nurse Practitioner

## 2011-09-24 ENCOUNTER — Telehealth: Payer: Self-pay | Admitting: *Deleted

## 2011-09-24 ENCOUNTER — Ambulatory Visit (HOSPITAL_BASED_OUTPATIENT_CLINIC_OR_DEPARTMENT_OTHER): Payer: Medicaid Other

## 2011-09-24 VITALS — BP 103/67 | HR 87 | Temp 97.3°F | Resp 20 | Ht 70.0 in | Wt 182.9 lb

## 2011-09-24 DIAGNOSIS — F319 Bipolar disorder, unspecified: Secondary | ICD-10-CM

## 2011-09-24 DIAGNOSIS — C19 Malignant neoplasm of rectosigmoid junction: Secondary | ICD-10-CM

## 2011-09-24 DIAGNOSIS — C189 Malignant neoplasm of colon, unspecified: Secondary | ICD-10-CM

## 2011-09-24 DIAGNOSIS — K4020 Bilateral inguinal hernia, without obstruction or gangrene, not specified as recurrent: Secondary | ICD-10-CM

## 2011-09-24 DIAGNOSIS — Z5111 Encounter for antineoplastic chemotherapy: Secondary | ICD-10-CM

## 2011-09-24 DIAGNOSIS — D696 Thrombocytopenia, unspecified: Secondary | ICD-10-CM

## 2011-09-24 LAB — CBC WITH DIFFERENTIAL/PLATELET
Basophils Absolute: 0 10*3/uL (ref 0.0–0.1)
Eosinophils Absolute: 0.2 10*3/uL (ref 0.0–0.5)
HCT: 45.2 % (ref 38.4–49.9)
LYMPH%: 30.6 % (ref 14.0–49.0)
MCV: 100.7 fL — ABNORMAL HIGH (ref 79.3–98.0)
MONO#: 0.8 10*3/uL (ref 0.1–0.9)
MONO%: 16.9 % — ABNORMAL HIGH (ref 0.0–14.0)
NEUT#: 2.3 10*3/uL (ref 1.5–6.5)
NEUT%: 47.8 % (ref 39.0–75.0)
Platelets: 99 10*3/uL — ABNORMAL LOW (ref 140–400)
RBC: 4.49 10*6/uL (ref 4.20–5.82)
nRBC: 0 % (ref 0–0)

## 2011-09-24 LAB — COMPREHENSIVE METABOLIC PANEL (CC13)
BUN: 9 mg/dL (ref 7.0–26.0)
CO2: 26 mEq/L (ref 22–29)
Calcium: 9 mg/dL (ref 8.4–10.4)
Chloride: 106 mEq/L (ref 98–107)
Creatinine: 0.9 mg/dL (ref 0.7–1.3)
Glucose: 74 mg/dl (ref 70–99)
Total Bilirubin: 0.8 mg/dL (ref 0.20–1.20)

## 2011-09-24 MED ORDER — DEXAMETHASONE SODIUM PHOSPHATE 10 MG/ML IJ SOLN
10.0000 mg | Freq: Once | INTRAMUSCULAR | Status: AC
  Administered 2011-09-24: 10 mg via INTRAVENOUS

## 2011-09-24 MED ORDER — DEXTROSE 5 % IV SOLN
Freq: Once | INTRAVENOUS | Status: AC
  Administered 2011-09-24: 12:00:00 via INTRAVENOUS

## 2011-09-24 MED ORDER — ONDANSETRON 8 MG/50ML IVPB (CHCC)
8.0000 mg | Freq: Once | INTRAVENOUS | Status: AC
  Administered 2011-09-24: 8 mg via INTRAVENOUS

## 2011-09-24 MED ORDER — FLUOROURACIL CHEMO INJECTION 5 GM/100ML
2400.0000 mg/m2 | INTRAVENOUS | Status: DC
  Administered 2011-09-24: 4900 mg via INTRAVENOUS
  Filled 2011-09-24: qty 98

## 2011-09-24 MED ORDER — FLUOROURACIL CHEMO INJECTION 2.5 GM/50ML
400.0000 mg/m2 | Freq: Once | INTRAVENOUS | Status: AC
  Administered 2011-09-24: 800 mg via INTRAVENOUS
  Filled 2011-09-24: qty 16

## 2011-09-24 MED ORDER — LEUCOVORIN CALCIUM INJECTION 350 MG
400.0000 mg/m2 | Freq: Once | INTRAVENOUS | Status: AC
  Administered 2011-09-24: 816 mg via INTRAVENOUS
  Filled 2011-09-24: qty 40.8

## 2011-09-24 MED ORDER — OXYCODONE-ACETAMINOPHEN 5-325 MG PO TABS: 1.0000 | ORAL_TABLET | Freq: Two times a day (BID) | ORAL | Status: DC | PRN

## 2011-09-24 MED ORDER — OXALIPLATIN CHEMO INJECTION 100 MG/20ML
85.0000 mg/m2 | Freq: Once | INTRAVENOUS | Status: AC
  Administered 2011-09-24: 175 mg via INTRAVENOUS
  Filled 2011-09-24: qty 35

## 2011-09-24 NOTE — Progress Notes (Signed)
OFFICE PROGRESS NOTE  Interval history:  Taylor [name] returns as scheduled. He completed cycle 9 of FOLFOX 09/10/2011. He denies nausea/vomiting. No mouth sores. No diarrhea. He denies neuropathy symptoms. He continues to have pain associated with the inguinal hernia. He is taking Percocet 2 times daily.   Objective: Blood pressure 103/67, pulse 87, temperature 97.3 F (36.3 C), temperature source Oral, resp. rate 20, height 5\' 10"  (1.778 m), weight 182 lb 14.4 oz (82.963 kg).  Oropharynx is without thrush or ulceration. Lungs are clear. Regular cardiac rhythm. Port-A-Cath site without erythema. Abdomen is soft and nontender. No hepatomegaly. Extremities are without edema. Vibratory sense is very mildly decreased over the fingertips on the left hand and normal over the fingertips on the right hand.  Lab Results: Lab Results  Component Value Date   WBC 4.8 09/24/2011   HGB 15.7 09/24/2011   HCT 45.2 09/24/2011   MCV 100.7* 09/24/2011   PLT 99* 09/24/2011    Chemistry:    Chemistry      Component Value Date/Time   NA 139 09/10/2011 0915   K 4.0 09/10/2011 0915   CL 104 09/10/2011 0915   CO2 29 09/10/2011 0915   BUN 13 09/10/2011 0915   CREATININE 0.85 09/10/2011 0915      Component Value Date/Time   CALCIUM 8.8 09/10/2011 0915   ALKPHOS 97 09/10/2011 0915   AST 46* 09/10/2011 0915   ALT 34 09/10/2011 0915   BILITOT 0.3 09/10/2011 0915       Studies/Results: No results found.  Medications: I have reviewed the patient's current medications.  Assessment/Plan:  1. 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 Adjuvant FOLFOX chemotherapy initiated 05/21/2011. 2. CT scan chest 05/15/2011 with no evidence of metastatic disease in the chest. Mild centrilobular and paraseptal emphysematous changes. 3. Normal preoperative CEA (2.4 on 04/10/2011). 4. History of bipolar disease. 5. Right inguinal  hernia. He is taking Percocet as needed. 6. Mild thrombocytopenia secondary to chemotherapy. Stable.  Disposition-Taylor [name] appears stable. He has completed 9 cycles of FOLFOX chemotherapy. Oxaliplatin was held with cycle 8 due to thrombocytopenia. Oxaliplatin was resumed with cycle 9. Plan to proceed with cycle 10 with oxaliplatin today as scheduled. He will return for a followup visit and cycle 11 FOLFOX in 2 weeks. He will contact the office in the interim with any problems.  Plan reviewed with Dr. Truett Perna.  Lonna Cobb ANP/GNP-BC

## 2011-09-24 NOTE — Patient Instructions (Signed)
[facility] Discharge Instructions for Patients Receiving Chemotherapy  Today you received the following chemotherapy agents Leucovorin, Oxaliplatin and Adrucil.  To help prevent nausea and vomiting after your treatment, we encourage you to take your nausea medication. Begin taking your medication as often as prescribed for Dr. Truett Perna.    If you develop nausea and vomiting that is not controlled by your nausea medication, call the clinic. If it is after clinic hours your family physician or the after hours number for the clinic or go to the Emergency Department.   BELOW ARE SYMPTOMS THAT SHOULD BE REPORTED IMMEDIATELY:  *FEVER GREATER THAN 100.5 F  *CHILLS WITH OR WITHOUT FEVER  NAUSEA AND VOMITING THAT IS NOT CONTROLLED WITH YOUR NAUSEA MEDICATION  *UNUSUAL SHORTNESS OF BREATH  *UNUSUAL BRUISING OR BLEEDING  TENDERNESS IN MOUTH AND THROAT WITH OR WITHOUT PRESENCE OF ULCERS  *URINARY PROBLEMS  *BOWEL PROBLEMS  UNUSUAL RASH Items with * indicate a potential emergency and should be followed up as soon as possible.  One of the nurses will contact you 24 hours after your treatment. Please let the nurse know about any problems that you may have experienced. Feel free to call the clinic you have any questions or concerns. The clinic phone number is [phone].   I have been informed and understand all the instructions given to me. I know to contact the clinic, my physician, or go to the Emergency Department if any problems should occur. I do not have any questions at this time, but understand that I may call the clinic during office hours   should I have any questions or need assistance in obtaining follow up care.    __________________________________________  _____________  __________ Signature of Patient or Authorized Representative            Date                   Time    __________________________________________ Nurse's Signature

## 2011-09-24 NOTE — Telephone Encounter (Signed)
Per staff message and POF I have scheduled appt.  JMW  

## 2011-09-26 ENCOUNTER — Ambulatory Visit (HOSPITAL_BASED_OUTPATIENT_CLINIC_OR_DEPARTMENT_OTHER): Payer: Medicaid Other

## 2011-09-26 VITALS — BP 112/76 | HR 76 | Temp 98.0°F

## 2011-09-26 DIAGNOSIS — C189 Malignant neoplasm of colon, unspecified: Secondary | ICD-10-CM

## 2011-09-26 DIAGNOSIS — C19 Malignant neoplasm of rectosigmoid junction: Secondary | ICD-10-CM

## 2011-09-26 MED ORDER — HEPARIN SOD (PORK) LOCK FLUSH 100 UNIT/ML IV SOLN
500.0000 [IU] | Freq: Once | INTRAVENOUS | Status: AC | PRN
  Administered 2011-09-26: 500 [IU]
  Filled 2011-09-26: qty 5

## 2011-09-26 MED ORDER — SODIUM CHLORIDE 0.9 % IJ SOLN
10.0000 mL | INTRAMUSCULAR | Status: DC | PRN
  Administered 2011-09-26: 10 mL
  Filled 2011-09-26: qty 10

## 2011-09-26 NOTE — Patient Instructions (Addendum)
Call MD for problems 

## 2011-10-06 ENCOUNTER — Other Ambulatory Visit: Payer: Self-pay | Admitting: Oncology

## 2011-10-08 ENCOUNTER — Ambulatory Visit (HOSPITAL_BASED_OUTPATIENT_CLINIC_OR_DEPARTMENT_OTHER): Payer: Medicaid Other

## 2011-10-08 ENCOUNTER — Other Ambulatory Visit (HOSPITAL_BASED_OUTPATIENT_CLINIC_OR_DEPARTMENT_OTHER): Payer: Medicaid Other | Admitting: Lab

## 2011-10-08 ENCOUNTER — Ambulatory Visit (HOSPITAL_BASED_OUTPATIENT_CLINIC_OR_DEPARTMENT_OTHER): Payer: Medicaid Other | Admitting: Nurse Practitioner

## 2011-10-08 VITALS — BP 128/79 | HR 78 | Temp 97.0°F | Resp 20 | Ht 70.0 in | Wt 184.0 lb

## 2011-10-08 DIAGNOSIS — Z5111 Encounter for antineoplastic chemotherapy: Secondary | ICD-10-CM

## 2011-10-08 DIAGNOSIS — C19 Malignant neoplasm of rectosigmoid junction: Secondary | ICD-10-CM

## 2011-10-08 DIAGNOSIS — D6959 Other secondary thrombocytopenia: Secondary | ICD-10-CM

## 2011-10-08 DIAGNOSIS — C189 Malignant neoplasm of colon, unspecified: Secondary | ICD-10-CM

## 2011-10-08 LAB — CBC & DIFF AND RETIC
Basophils Absolute: 0 10*3/uL (ref 0.0–0.1)
HCT: 44.5 % (ref 38.4–49.9)
HGB: 15 g/dL (ref 13.0–17.1)
Immature Retic Fract: 11.7 % — ABNORMAL HIGH (ref 3.00–10.60)
MONO#: 0.7 10*3/uL (ref 0.1–0.9)
NEUT#: 2.4 10*3/uL (ref 1.5–6.5)
NEUT%: 50.6 % (ref 39.0–75.0)
Retic Ct Abs: 89.82 10*3/uL (ref 34.80–93.90)
WBC: 4.7 10*3/uL (ref 4.0–10.3)
lymph#: 1.5 10*3/uL (ref 0.9–3.3)

## 2011-10-08 LAB — COMPREHENSIVE METABOLIC PANEL (CC13)
ALT: 32 U/L (ref 0–55)
AST: 42 U/L — ABNORMAL HIGH (ref 5–34)
Albumin: 4 g/dL (ref 3.5–5.0)
Alkaline Phosphatase: 118 U/L (ref 40–150)
Calcium: 9 mg/dL (ref 8.4–10.4)
Chloride: 104 mEq/L (ref 98–107)
Creatinine: 0.9 mg/dL (ref 0.7–1.3)
Potassium: 3.9 mEq/L (ref 3.5–5.1)

## 2011-10-08 MED ORDER — DEXAMETHASONE SODIUM PHOSPHATE 10 MG/ML IJ SOLN
10.0000 mg | Freq: Once | INTRAMUSCULAR | Status: AC
  Administered 2011-10-08: 10 mg via INTRAVENOUS

## 2011-10-08 MED ORDER — FLUOROURACIL CHEMO INJECTION 2.5 GM/50ML
400.0000 mg/m2 | Freq: Once | INTRAVENOUS | Status: AC
  Administered 2011-10-08: 800 mg via INTRAVENOUS
  Filled 2011-10-08: qty 16

## 2011-10-08 MED ORDER — SODIUM CHLORIDE 0.9 % IV SOLN
2400.0000 mg/m2 | INTRAVENOUS | Status: DC
  Administered 2011-10-08: 4900 mg via INTRAVENOUS
  Filled 2011-10-08: qty 98

## 2011-10-08 MED ORDER — OXALIPLATIN CHEMO INJECTION 100 MG/20ML
85.0000 mg/m2 | Freq: Once | INTRAVENOUS | Status: AC
  Administered 2011-10-08: 175 mg via INTRAVENOUS
  Filled 2011-10-08: qty 35

## 2011-10-08 MED ORDER — LEUCOVORIN CALCIUM INJECTION 350 MG
400.0000 mg/m2 | Freq: Once | INTRAVENOUS | Status: AC
  Administered 2011-10-08: 816 mg via INTRAVENOUS
  Filled 2011-10-08: qty 40.8

## 2011-10-08 MED ORDER — ONDANSETRON 8 MG/50ML IVPB (CHCC)
8.0000 mg | Freq: Once | INTRAVENOUS | Status: AC
  Administered 2011-10-08: 8 mg via INTRAVENOUS

## 2011-10-08 MED ORDER — DEXTROSE 5 % IV SOLN
Freq: Once | INTRAVENOUS | Status: AC
  Administered 2011-10-08: 12:00:00 via INTRAVENOUS

## 2011-10-08 NOTE — Patient Instructions (Addendum)
[facility] Discharge Instructions for Patients Receiving Chemotherapy  Today you received the following chemotherapy agents Oxaliplatin, Leucovorin and 5-FU.  To help prevent nausea and vomiting after your treatment, we encourage you to take your nausea medication as ordered per MD.    If you develop nausea and vomiting that is not controlled by your nausea medication, call the clinic. If it is after clinic hours your family physician or the after hours number for the clinic or go to the Emergency Department.   BELOW ARE SYMPTOMS THAT SHOULD BE REPORTED IMMEDIATELY:  *FEVER GREATER THAN 100.5 F  *CHILLS WITH OR WITHOUT FEVER  NAUSEA AND VOMITING THAT IS NOT CONTROLLED WITH YOUR NAUSEA MEDICATION  *UNUSUAL SHORTNESS OF BREATH  *UNUSUAL BRUISING OR BLEEDING  TENDERNESS IN MOUTH AND THROAT WITH OR WITHOUT PRESENCE OF ULCERS  *URINARY PROBLEMS  *BOWEL PROBLEMS  UNUSUAL RASH Items with * indicate a potential emergency and should be followed up as soon as possible.   Please let the nurse know about any problems that you may have experienced. Feel free to call the clinic you have any questions or concerns. The clinic phone number is [phone].   I have been informed and understand all the instructions given to me. I know to contact the clinic, my physician, or go to the Emergency Department if any problems should occur. I do not have any questions at this time, but understand that I may call the clinic during office hours   should I have any questions or need assistance in obtaining follow up care.    __________________________________________  _____________  __________ Signature of Patient or Authorized Representative            Date                   Time    __________________________________________ Nurse's Signature    

## 2011-10-08 NOTE — Progress Notes (Signed)
Discharged at 1533 after pump connect.

## 2011-10-08 NOTE — Progress Notes (Signed)
OFFICE PROGRESS NOTE  Interval history:  Taylor [name] returns as scheduled. He completed cycle 10 of FOLFOX on 09/24/2011. He denies nausea/vomiting. No mouth sores. No diarrhea. He had cold sensitivity lasting approximately 3-4 days. He denies persistent neuropathy symptoms.   Objective: Blood pressure 128/79, pulse 78, temperature 97 F (36.1 C), resp. rate 20, height 5\' 10"  (1.778 m), weight 184 lb (83.462 kg).  Oropharynx is without thrush or ulceration. Lungs are clear. Regular cardiac rhythm. Port-A-Cath site is without erythema. Abdomen is soft and nontender. No hepatomegaly. Extremities are without edema. Calves are soft and nontender. Vibratory sense is intact over the fingertips.  Lab Results: Lab Results  Component Value Date   WBC 4.7 10/08/2011   HGB 15.0 10/08/2011   HCT 44.5 10/08/2011   MCV 102.1* 10/08/2011   PLT 81* 10/08/2011    Chemistry:    Chemistry      Component Value Date/Time   NA 142 09/24/2011 0949   NA 139 09/10/2011 0915   K 4.4 09/24/2011 0949   K 4.0 09/10/2011 0915   CL 106 09/24/2011 0949   CL 104 09/10/2011 0915   CO2 26 09/24/2011 0949   CO2 29 09/10/2011 0915   BUN 9.0 09/24/2011 0949   BUN 13 09/10/2011 0915   CREATININE 0.9 09/24/2011 0949   CREATININE 0.85 09/10/2011 0915      Component Value Date/Time   CALCIUM 9.0 09/24/2011 0949   CALCIUM 8.8 09/10/2011 0915   ALKPHOS 101 09/24/2011 0949   ALKPHOS 97 09/10/2011 0915   AST 46* 09/24/2011 0949   AST 46* 09/10/2011 0915   ALT 40 09/24/2011 0949   ALT 34 09/10/2011 0915   BILITOT 0.80 09/24/2011 0949   BILITOT 0.3 09/10/2011 0915       Studies/Results: No results found.  Medications: I have reviewed the patient's current medications.  Assessment/Plan:  1. 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 Adjuvant FOLFOX chemotherapy initiated 05/21/2011. Oxaliplatin was held with cycle 8 due to thrombocytopenia. He  completed cycle 10 on 09/24/2011. 2. CT scan chest 05/15/2011 with no evidence of metastatic disease in the chest. Mild centrilobular and paraseptal emphysematous changes. 3. Normal preoperative CEA (2.4 on 04/10/2011). 4. History of bipolar disease. 5. Right inguinal hernia. He is taking Percocet as needed. 6. Mild thrombocytopenia secondary to chemotherapy.   Disposition-Taylor [name] appears stable. He has completed 10 cycles of FOLFOX chemotherapy. He is thrombocytopenic on labs today. Plan to proceed with cycle 11 FOLFOX as scheduled. He will return for a followup visit and cycle 12 in 2 weeks. He will contact the office in the interim with any problems. We specifically discussed spontaneous bruising, bleeding.  Plan reviewed with Dr. Truett Perna.  Lonna Cobb ANP/GNP-BC

## 2011-10-10 ENCOUNTER — Ambulatory Visit (HOSPITAL_BASED_OUTPATIENT_CLINIC_OR_DEPARTMENT_OTHER): Payer: Medicaid Other

## 2011-10-10 VITALS — BP 127/83 | HR 85 | Temp 98.0°F

## 2011-10-10 DIAGNOSIS — Z452 Encounter for adjustment and management of vascular access device: Secondary | ICD-10-CM

## 2011-10-10 DIAGNOSIS — C189 Malignant neoplasm of colon, unspecified: Secondary | ICD-10-CM

## 2011-10-10 DIAGNOSIS — C19 Malignant neoplasm of rectosigmoid junction: Secondary | ICD-10-CM

## 2011-10-10 MED ORDER — HEPARIN SOD (PORK) LOCK FLUSH 100 UNIT/ML IV SOLN
500.0000 [IU] | Freq: Once | INTRAVENOUS | Status: AC | PRN
  Administered 2011-10-10: 500 [IU]
  Filled 2011-10-10: qty 5

## 2011-10-10 MED ORDER — SODIUM CHLORIDE 0.9 % IJ SOLN
10.0000 mL | INTRAMUSCULAR | Status: DC | PRN
  Administered 2011-10-10: 10 mL
  Filled 2011-10-10: qty 10

## 2011-10-10 NOTE — Patient Instructions (Signed)
Call MD for problems 

## 2011-10-20 ENCOUNTER — Other Ambulatory Visit: Payer: Self-pay | Admitting: Oncology

## 2011-10-22 ENCOUNTER — Ambulatory Visit (HOSPITAL_BASED_OUTPATIENT_CLINIC_OR_DEPARTMENT_OTHER): Payer: Medicaid Other

## 2011-10-22 ENCOUNTER — Other Ambulatory Visit (HOSPITAL_BASED_OUTPATIENT_CLINIC_OR_DEPARTMENT_OTHER): Payer: Medicaid Other

## 2011-10-22 ENCOUNTER — Ambulatory Visit (HOSPITAL_BASED_OUTPATIENT_CLINIC_OR_DEPARTMENT_OTHER): Payer: Medicaid Other | Admitting: Oncology

## 2011-10-22 ENCOUNTER — Telehealth: Payer: Self-pay | Admitting: Oncology

## 2011-10-22 ENCOUNTER — Ambulatory Visit: Payer: Medicaid Other

## 2011-10-22 VITALS — BP 122/75 | HR 93 | Temp 97.8°F | Resp 20 | Ht 70.0 in | Wt 185.1 lb

## 2011-10-22 DIAGNOSIS — C19 Malignant neoplasm of rectosigmoid junction: Secondary | ICD-10-CM

## 2011-10-22 DIAGNOSIS — Z5111 Encounter for antineoplastic chemotherapy: Secondary | ICD-10-CM

## 2011-10-22 DIAGNOSIS — C189 Malignant neoplasm of colon, unspecified: Secondary | ICD-10-CM

## 2011-10-22 DIAGNOSIS — K4020 Bilateral inguinal hernia, without obstruction or gangrene, not specified as recurrent: Secondary | ICD-10-CM

## 2011-10-22 DIAGNOSIS — D6959 Other secondary thrombocytopenia: Secondary | ICD-10-CM

## 2011-10-22 LAB — COMPREHENSIVE METABOLIC PANEL (CC13)
Albumin: 3.8 g/dL (ref 3.5–5.0)
BUN: 9 mg/dL (ref 7.0–26.0)
CO2: 22 mEq/L (ref 22–29)
Glucose: 191 mg/dl — ABNORMAL HIGH (ref 70–99)
Potassium: 3.8 mEq/L (ref 3.5–5.1)
Sodium: 138 mEq/L (ref 136–145)
Total Bilirubin: 0.8 mg/dL (ref 0.20–1.20)
Total Protein: 6.9 g/dL (ref 6.4–8.3)

## 2011-10-22 LAB — CBC WITH DIFFERENTIAL/PLATELET
Basophils Absolute: 0 10*3/uL (ref 0.0–0.1)
Eosinophils Absolute: 0.1 10*3/uL (ref 0.0–0.5)
HGB: 15 g/dL (ref 13.0–17.1)
LYMPH%: 26 % (ref 14.0–49.0)
MCV: 104.9 fL — ABNORMAL HIGH (ref 79.3–98.0)
MONO#: 0.8 10*3/uL (ref 0.1–0.9)
MONO%: 17.3 % — ABNORMAL HIGH (ref 0.0–14.0)
NEUT#: 2.4 10*3/uL (ref 1.5–6.5)
Platelets: 93 10*3/uL — ABNORMAL LOW (ref 140–400)
RBC: 4.17 10*6/uL — ABNORMAL LOW (ref 4.20–5.82)
WBC: 4.4 10*3/uL (ref 4.0–10.3)

## 2011-10-22 MED ORDER — OXYCODONE-ACETAMINOPHEN 5-325 MG PO TABS: 1.0000 | ORAL_TABLET | Freq: Two times a day (BID) | ORAL | Status: DC | PRN

## 2011-10-22 MED ORDER — DEXTROSE 5 % IV SOLN
Freq: Once | INTRAVENOUS | Status: AC
  Administered 2011-10-22: 11:00:00 via INTRAVENOUS

## 2011-10-22 MED ORDER — OXALIPLATIN CHEMO INJECTION 100 MG/20ML
85.0000 mg/m2 | Freq: Once | INTRAVENOUS | Status: AC
  Administered 2011-10-22: 175 mg via INTRAVENOUS
  Filled 2011-10-22: qty 35

## 2011-10-22 MED ORDER — FLUOROURACIL CHEMO INJECTION 2.5 GM/50ML
400.0000 mg/m2 | Freq: Once | INTRAVENOUS | Status: AC
  Administered 2011-10-22: 800 mg via INTRAVENOUS
  Filled 2011-10-22: qty 16

## 2011-10-22 MED ORDER — ONDANSETRON 8 MG/50ML IVPB (CHCC)
8.0000 mg | Freq: Once | INTRAVENOUS | Status: AC
  Administered 2011-10-22: 8 mg via INTRAVENOUS

## 2011-10-22 MED ORDER — DEXAMETHASONE SODIUM PHOSPHATE 10 MG/ML IJ SOLN
10.0000 mg | Freq: Once | INTRAMUSCULAR | Status: AC
  Administered 2011-10-22: 10 mg via INTRAVENOUS

## 2011-10-22 MED ORDER — LEUCOVORIN CALCIUM INJECTION 350 MG
400.0000 mg/m2 | Freq: Once | INTRAVENOUS | Status: AC
  Administered 2011-10-22: 816 mg via INTRAVENOUS
  Filled 2011-10-22: qty 40.8

## 2011-10-22 MED ORDER — SODIUM CHLORIDE 0.9 % IV SOLN
2400.0000 mg/m2 | INTRAVENOUS | Status: DC
  Administered 2011-10-22: 4900 mg via INTRAVENOUS
  Filled 2011-10-22: qty 98

## 2011-10-22 NOTE — Telephone Encounter (Signed)
Pt sent back to lb and given appt schedule for October and November. Per Liborio Nixon @ [facility] pt has to be referred for port removal and hernia repair by the provider listed on his PCP. Pt/desk nurse/BS made aware of above and that pt needs to make contact w/his [place] [facility] provider for referral to [facility].

## 2011-10-22 NOTE — Progress Notes (Signed)
   [facility]    OFFICE PROGRESS NOTE   INTERVAL HISTORY:   He returns as scheduled. He completed another cycle of FOLFOX on 10/08/2011. He tolerated the chemotherapy well. No nausea, mouth sores, or diarrhea. Cold sensitivity and mild peripheral numbness lasted for approximately 5 days. No neuropathy symptoms today. He continues to have discomfort associated with a right inguinal hernia.  Objective:  Vital signs in last 24 hours:  There were no vitals taken for this visit.    HEENT: No thrush or ulcers Resp: Lungs clear bilaterally Cardio: Regular rate and rhythm GI: No hepatosplenomegaly, no mass Vascular: No leg edema Neuro: Mild decrease in vibratory sense at the left fingertips, normal on the right  Skin: Mild skin thickening at the palms. No erythema   Portacath/PICC-without erythema  Lab Results:  Lab Results  Component Value Date   WBC 4.4 10/22/2011   HGB 15.0 10/22/2011   HCT 43.8 10/22/2011   MCV 104.9* 10/22/2011   PLT 93* 10/22/2011   ANC 2.4   Medications: I have reviewed the patient's current medications.  Assessment/Plan: 1. 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 Adjuvant FOLFOX chemotherapy initiated 05/21/2011. Oxaliplatin was held with cycle 8 due to thrombocytopenia. He completed cycle 11 on 10/08/2011 2. CT scan chest 05/15/2011 with no evidence of metastatic disease in the chest. Mild centrilobular and paraseptal emphysematous changes. 3. Normal preoperative CEA (2.4 on 04/10/2011). 4. History of bipolar disease. 5. Right inguinal hernia. He is taking Percocet as needed. 6. Mild thrombocytopenia secondary to chemotherapy.   Disposition:  He will complete the final cycle of FOLFOX chemotherapy today. He does not have significant neuropathy symptoms. Mr. Gift will return for a CBC in 3 weeks. He is scheduled for an office visit  in 6 weeks. We checked a CEA today.  We will refer him to Dr. Gerrit Friends for removal of the Port-A-Cath and repair of the right inguinal hernia.   [name], MD  10/22/2011  10:06 AM

## 2011-10-22 NOTE — Patient Instructions (Addendum)
[facility] Discharge Instructions for Patients Receiving Chemotherapy  Today you received the following chemotherapy agents FOLFOX  To help prevent nausea and vomiting after your treatment, we encourage you to take your nausea medication if needed Begin taking it at 5pm and take it as often as prescribed if needed.   If you develop nausea and vomiting that is not controlled by your nausea medication, call the clinic. If it is after clinic hours your family physician or the after hours number for the clinic or go to the Emergency Department.   BELOW ARE SYMPTOMS THAT SHOULD BE REPORTED IMMEDIATELY:  *FEVER GREATER THAN 100.5 F  *CHILLS WITH OR WITHOUT FEVER  NAUSEA AND VOMITING THAT IS NOT CONTROLLED WITH YOUR NAUSEA MEDICATION  *UNUSUAL SHORTNESS OF BREATH  *UNUSUAL BRUISING OR BLEEDING  TENDERNESS IN MOUTH AND THROAT WITH OR WITHOUT PRESENCE OF ULCERS  *URINARY PROBLEMS  *BOWEL PROBLEMS  UNUSUAL RASH Items with * indicate a potential emergency and should be followed up as soon as possible.  One of the nurses will contact you 24 hours after your treatment. Please let the nurse know about any problems that you may have experienced. Feel free to call the clinic you have any questions or concerns. The clinic phone number is [phone].   I have been informed and understand all the instructions given to me. I know to contact the clinic, my physician, or go to the Emergency Department if any problems should occur. I do not have any questions at this time, but understand that I may call the clinic during office hours   should I have any questions or need assistance in obtaining follow up care.    __________________________________________  _____________  __________ Signature of Patient or Authorized Representative            Date                   Time    __________________________________________ Nurse's Signature

## 2011-10-23 ENCOUNTER — Telehealth (INDEPENDENT_AMBULATORY_CARE_PROVIDER_SITE_OTHER): Payer: Self-pay

## 2011-10-23 LAB — CEA: CEA: 3.1 ng/mL (ref 0.0–5.0)

## 2011-10-23 NOTE — Telephone Encounter (Signed)
Received note from Dr Gerrit Friends re: appt for hernia and poss pac rem. Per Dr Kalman Drape note they could not make referral due to medicaid. I have left msg to call re: this. Pt will need to have the pcp- gatekeeper make his appt due to [facility] medicaid requirements.

## 2011-10-24 ENCOUNTER — Ambulatory Visit (HOSPITAL_BASED_OUTPATIENT_CLINIC_OR_DEPARTMENT_OTHER): Payer: Medicaid Other

## 2011-10-24 VITALS — BP 143/88 | HR 85 | Temp 98.1°F

## 2011-10-24 DIAGNOSIS — C19 Malignant neoplasm of rectosigmoid junction: Secondary | ICD-10-CM

## 2011-10-24 DIAGNOSIS — Z452 Encounter for adjustment and management of vascular access device: Secondary | ICD-10-CM

## 2011-10-24 DIAGNOSIS — C189 Malignant neoplasm of colon, unspecified: Secondary | ICD-10-CM

## 2011-10-24 MED ORDER — HEPARIN SOD (PORK) LOCK FLUSH 100 UNIT/ML IV SOLN
500.0000 [IU] | Freq: Once | INTRAVENOUS | Status: AC | PRN
  Administered 2011-10-24: 500 [IU]
  Filled 2011-10-24: qty 5

## 2011-10-24 MED ORDER — SODIUM CHLORIDE 0.9 % IJ SOLN
10.0000 mL | INTRAMUSCULAR | Status: DC | PRN
  Administered 2011-10-24: 10 mL
  Filled 2011-10-24: qty 10

## 2011-11-11 ENCOUNTER — Telehealth (INDEPENDENT_AMBULATORY_CARE_PROVIDER_SITE_OTHER): Payer: Self-pay

## 2011-11-11 NOTE — Telephone Encounter (Signed)
Message copied by Ivory Broad on Mon Nov 11, 2011  1:39 PM ------      Message from: Velora Heckler      Created: Mon Nov 11, 2011  1:35 PM       [place] Dec:            Have him get his narcotics refilled by oncology, Dr. Truett Perna.  I'm not going to refill narcotics if I can't even see him in the office.            tmg            Velora Heckler, MD, [facility]      Office: [phone]

## 2011-11-11 NOTE — Telephone Encounter (Signed)
I notified the pt of what Dr Gerrit Friends said, that he needs to get his med from Dr Truett Perna.

## 2011-11-11 NOTE — Telephone Encounter (Signed)
The patient has a hernia that Dr Gerrit Friends has seen.  He is finished with his chemo/ radiation.  He is asking for more Percocet.  He is trying to get in to see Dr Gerrit Friends again but needs a referral due to his Medicaid.  Please advise if you will refill the Percocet.  Let him know when to pick it up.

## 2011-11-13 ENCOUNTER — Other Ambulatory Visit (HOSPITAL_BASED_OUTPATIENT_CLINIC_OR_DEPARTMENT_OTHER): Payer: Medicaid Other

## 2011-11-13 DIAGNOSIS — C189 Malignant neoplasm of colon, unspecified: Secondary | ICD-10-CM

## 2011-11-13 LAB — CBC WITH DIFFERENTIAL/PLATELET
BASO%: 0.6 % (ref 0.0–2.0)
Basophils Absolute: 0 10*3/uL (ref 0.0–0.1)
EOS%: 3.1 % (ref 0.0–7.0)
Eosinophils Absolute: 0.1 10*3/uL (ref 0.0–0.5)
HCT: 45.4 % (ref 38.4–49.9)
HGB: 15.5 g/dL (ref 13.0–17.1)
LYMPH%: 40.7 % (ref 14.0–49.0)
MCH: 35.4 pg — ABNORMAL HIGH (ref 27.2–33.4)
MCHC: 34.1 g/dL (ref 32.0–36.0)
MCV: 103.8 fL — ABNORMAL HIGH (ref 79.3–98.0)
MONO#: 0.9 10*3/uL (ref 0.1–0.9)
MONO%: 20.1 % — ABNORMAL HIGH (ref 0.0–14.0)
NEUT#: 1.7 10*3/uL (ref 1.5–6.5)
NEUT%: 35.5 % — ABNORMAL LOW (ref 39.0–75.0)
Platelets: 122 10*3/uL — ABNORMAL LOW (ref 140–400)
RBC: 4.38 10*6/uL (ref 4.20–5.82)
RDW: 15.8 % — ABNORMAL HIGH (ref 11.0–14.6)
WBC: 4.7 10*3/uL (ref 4.0–10.3)
lymph#: 1.9 10*3/uL (ref 0.9–3.3)

## 2011-12-02 ENCOUNTER — Ambulatory Visit (HOSPITAL_BASED_OUTPATIENT_CLINIC_OR_DEPARTMENT_OTHER): Payer: Medicaid Other | Admitting: Nurse Practitioner

## 2011-12-02 VITALS — BP 138/84 | HR 82 | Temp 97.4°F | Resp 20 | Ht 70.0 in | Wt 183.2 lb

## 2011-12-02 DIAGNOSIS — C19 Malignant neoplasm of rectosigmoid junction: Secondary | ICD-10-CM

## 2011-12-02 DIAGNOSIS — K4090 Unilateral inguinal hernia, without obstruction or gangrene, not specified as recurrent: Secondary | ICD-10-CM

## 2011-12-02 DIAGNOSIS — K4020 Bilateral inguinal hernia, without obstruction or gangrene, not specified as recurrent: Secondary | ICD-10-CM

## 2011-12-02 DIAGNOSIS — C189 Malignant neoplasm of colon, unspecified: Secondary | ICD-10-CM

## 2011-12-02 MED ORDER — HEPARIN SOD (PORK) LOCK FLUSH 100 UNIT/ML IV SOLN
500.0000 [IU] | Freq: Once | INTRAVENOUS | Status: AC
  Administered 2011-12-02: 500 [IU] via INTRAVENOUS
  Filled 2011-12-02: qty 5

## 2011-12-02 MED ORDER — SODIUM CHLORIDE 0.9 % IJ SOLN
10.0000 mL | INTRAMUSCULAR | Status: DC | PRN
Start: 2011-12-02 — End: 2011-12-02
  Administered 2011-12-02: 10 mL via INTRAVENOUS
  Filled 2011-12-02: qty 10

## 2011-12-02 MED ORDER — OXYCODONE-ACETAMINOPHEN 5-325 MG PO TABS: 1.0000 | ORAL_TABLET | Freq: Two times a day (BID) | ORAL | Status: DC | PRN

## 2011-12-02 NOTE — Progress Notes (Signed)
OFFICE PROGRESS NOTE  Interval history:  Taylor [name] returns as scheduled. He completed the 12th and final cycle of FOLFOX chemotherapy on 10/22/2011. He feels well. He denies nausea/vomiting. No mouth sores. No diarrhea. He denies neuropathy symptoms. He has a good appetite. He continues to have pain associated with the right inguinal hernia. He is taking Percocet as needed.   Objective: Blood pressure 138/84, pulse 82, temperature 97.4 F (36.3 C), temperature source Oral, resp. rate 20, height 5\' 10"  (1.778 m), weight 183 lb 3.2 oz (83.099 kg).  Oropharynx is without thrush or ulceration. No palpable cervical, supraclavicular or axillary lymph nodes. Lungs are clear. Regular cardiac rhythm. Port-A-Cath site is without erythema. Abdomen is soft and nontender. No hepatomegaly. Extremities are without edema. Calves are soft and nontender. Vibratory sense is intact over the fingertips per tuning fork exam.  Lab Results: Lab Results  Component Value Date   WBC 4.7 11/13/2011   HGB 15.5 11/13/2011   HCT 45.4 11/13/2011   MCV 103.8* 11/13/2011   PLT 122* 11/13/2011    Chemistry:    Chemistry      Component Value Date/Time   NA 138 10/22/2011 0946   NA 139 09/10/2011 0915   K 3.8 10/22/2011 0946   K 4.0 09/10/2011 0915   CL 105 10/22/2011 0946   CL 104 09/10/2011 0915   CO2 22 10/22/2011 0946   CO2 29 09/10/2011 0915   BUN 9.0 10/22/2011 0946   BUN 13 09/10/2011 0915   CREATININE 1.1 10/22/2011 0946   CREATININE 0.85 09/10/2011 0915      Component Value Date/Time   CALCIUM 9.2 10/22/2011 0946   CALCIUM 8.8 09/10/2011 0915   ALKPHOS 100 10/22/2011 0946   ALKPHOS 97 09/10/2011 0915   AST 38* 10/22/2011 0946   AST 46* 09/10/2011 0915   ALT 29 10/22/2011 0946   ALT 34 09/10/2011 0915   BILITOT 0.80 10/22/2011 0946   BILITOT 0.3 09/10/2011 0915    10/22/2011 CEA 3.1.   Studies/Results: No results found.  Medications: I have reviewed the patient's current  medications.  Assessment/Plan:  1. 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 Adjuvant FOLFOX chemotherapy initiated 05/21/2011. Oxaliplatin was held with cycle 8 due to thrombocytopenia. He completed cycle 12 on 10/22/2011. 2. CT scan chest 05/15/2011 with no evidence of metastatic disease in the chest. Mild centrilobular and paraseptal emphysematous changes. 3. Normal preoperative CEA (2.4 on 04/10/2011). CEA normal at 3.1 on 10/22/2011. 4. History of bipolar disease. 5. Right inguinal hernia. He is taking Percocet as needed. 6. Mild thrombocytopenia secondary to chemotherapy. Improved on 11/13/2011.  Disposition-Taylor [name] appears stable. He completed the planned course of chemotherapy on 10/22/2011.   He has been referred to Dr. Gerrit Friends for Port-A-Cath removal and repair of the right inguinal hernia. Due to his insurance his primary physician needs to make the referral. He plans to schedule an appointment with his primary provider in the next several weeks.  His Port-A-Cath is being flushed today. He will return in 6 weeks for the next Port-A-Cath flush unless it is removed in the interim.  He will return for a CEA and follow-up visit in 4 months. He will contact the office in the interim with any problems.   Plan reviewed with Dr. Truett Perna.   Lonna Cobb ANP/GNP-BC

## 2011-12-03 ENCOUNTER — Encounter: Payer: Self-pay | Admitting: *Deleted

## 2011-12-03 ENCOUNTER — Telehealth: Payer: Self-pay | Admitting: *Deleted

## 2011-12-03 NOTE — Telephone Encounter (Signed)
Flush 01-14-2012  Flush 02-25-2012  LAB AND MD 03-31-2012  FLUSH 04-07-2012

## 2012-01-14 ENCOUNTER — Ambulatory Visit (HOSPITAL_BASED_OUTPATIENT_CLINIC_OR_DEPARTMENT_OTHER): Payer: Medicaid Other

## 2012-01-14 VITALS — BP 166/97 | HR 78 | Temp 97.0°F | Resp 20

## 2012-01-14 DIAGNOSIS — C189 Malignant neoplasm of colon, unspecified: Secondary | ICD-10-CM

## 2012-01-14 DIAGNOSIS — C19 Malignant neoplasm of rectosigmoid junction: Secondary | ICD-10-CM

## 2012-01-14 DIAGNOSIS — Z452 Encounter for adjustment and management of vascular access device: Secondary | ICD-10-CM

## 2012-01-14 MED ORDER — HEPARIN SOD (PORK) LOCK FLUSH 100 UNIT/ML IV SOLN
500.0000 [IU] | Freq: Once | INTRAVENOUS | Status: AC
  Administered 2012-01-14: 500 [IU] via INTRAVENOUS
  Filled 2012-01-14: qty 5

## 2012-01-14 MED ORDER — SODIUM CHLORIDE 0.9 % IJ SOLN
10.0000 mL | INTRAMUSCULAR | Status: DC | PRN
  Administered 2012-01-14: 10 mL via INTRAVENOUS
  Filled 2012-01-14: qty 10

## 2012-02-03 ENCOUNTER — Encounter (HOSPITAL_COMMUNITY): Payer: Self-pay

## 2012-02-03 ENCOUNTER — Emergency Department (HOSPITAL_COMMUNITY): Payer: Medicaid Other

## 2012-02-03 ENCOUNTER — Inpatient Hospital Stay (HOSPITAL_COMMUNITY)
Admission: EM | Admit: 2012-02-03 | Discharge: 2012-02-06 | DRG: 190 | Disposition: A | Discharge status: Home / Self Care | Payer: Medicaid Other | Attending: Internal Medicine | Admitting: Internal Medicine

## 2012-02-03 DIAGNOSIS — Z9049 Acquired absence of other specified parts of digestive tract: Secondary | ICD-10-CM

## 2012-02-03 DIAGNOSIS — D696 Thrombocytopenia, unspecified: Secondary | ICD-10-CM | POA: Diagnosis present

## 2012-02-03 DIAGNOSIS — J441 Chronic obstructive pulmonary disease with (acute) exacerbation: Principal | ICD-10-CM | POA: Diagnosis present

## 2012-02-03 DIAGNOSIS — C19 Malignant neoplasm of rectosigmoid junction: Secondary | ICD-10-CM | POA: Diagnosis present

## 2012-02-03 DIAGNOSIS — F329 Major depressive disorder, single episode, unspecified: Secondary | ICD-10-CM | POA: Diagnosis present

## 2012-02-03 DIAGNOSIS — R7309 Other abnormal glucose: Secondary | ICD-10-CM | POA: Diagnosis present

## 2012-02-03 DIAGNOSIS — J45901 Unspecified asthma with (acute) exacerbation: Principal | ICD-10-CM | POA: Diagnosis present

## 2012-02-03 DIAGNOSIS — D638 Anemia in other chronic diseases classified elsewhere: Secondary | ICD-10-CM | POA: Diagnosis present

## 2012-02-03 DIAGNOSIS — R1013 Epigastric pain: Secondary | ICD-10-CM | POA: Diagnosis present

## 2012-02-03 DIAGNOSIS — C189 Malignant neoplasm of colon, unspecified: Secondary | ICD-10-CM

## 2012-02-03 DIAGNOSIS — Z9221 Personal history of antineoplastic chemotherapy: Secondary | ICD-10-CM

## 2012-02-03 DIAGNOSIS — F319 Bipolar disorder, unspecified: Secondary | ICD-10-CM | POA: Diagnosis present

## 2012-02-03 DIAGNOSIS — T380X5A Adverse effect of glucocorticoids and synthetic analogues, initial encounter: Secondary | ICD-10-CM | POA: Diagnosis present

## 2012-02-03 DIAGNOSIS — K3189 Other diseases of stomach and duodenum: Secondary | ICD-10-CM | POA: Diagnosis present

## 2012-02-03 DIAGNOSIS — F32A Depression, unspecified: Secondary | ICD-10-CM | POA: Diagnosis present

## 2012-02-03 DIAGNOSIS — R739 Hyperglycemia, unspecified: Secondary | ICD-10-CM

## 2012-02-03 DIAGNOSIS — Z79899 Other long term (current) drug therapy: Secondary | ICD-10-CM

## 2012-02-03 DIAGNOSIS — F17200 Nicotine dependence, unspecified, uncomplicated: Secondary | ICD-10-CM | POA: Diagnosis present

## 2012-02-03 DIAGNOSIS — J189 Pneumonia, unspecified organism: Secondary | ICD-10-CM | POA: Diagnosis present

## 2012-02-03 DIAGNOSIS — K4020 Bilateral inguinal hernia, without obstruction or gangrene, not specified as recurrent: Secondary | ICD-10-CM | POA: Diagnosis present

## 2012-02-03 DIAGNOSIS — E099 Drug or chemical induced diabetes mellitus without complications: Secondary | ICD-10-CM | POA: Diagnosis present

## 2012-02-03 DIAGNOSIS — J449 Chronic obstructive pulmonary disease, unspecified: Secondary | ICD-10-CM | POA: Diagnosis present

## 2012-02-03 HISTORY — DX: Bipolar disorder, unspecified: F31.9

## 2012-02-03 LAB — CBC WITH DIFFERENTIAL/PLATELET
Hemoglobin: 16.2 g/dL (ref 13.0–17.0)
Lymphocytes Relative: 8 % — ABNORMAL LOW (ref 12–46)
Lymphs Abs: 0.7 10*3/uL (ref 0.7–4.0)
Monocytes Relative: 9 % (ref 3–12)
Neutro Abs: 7.6 10*3/uL (ref 1.7–7.7)
Neutrophils Relative %: 83 % — ABNORMAL HIGH (ref 43–77)
Platelets: 126 10*3/uL — ABNORMAL LOW (ref 150–400)
RBC: 4.78 MIL/uL (ref 4.22–5.81)
WBC: 9.1 10*3/uL (ref 4.0–10.5)

## 2012-02-03 LAB — COMPREHENSIVE METABOLIC PANEL
ALT: 23 U/L (ref 0–53)
Alkaline Phosphatase: 81 U/L (ref 39–117)
CO2: 30 mEq/L (ref 19–32)
Chloride: 93 mEq/L — ABNORMAL LOW (ref 96–112)
GFR calc Af Amer: >90 mL/min (ref >90)
GFR calc non Af Amer: 81 mL/min — ABNORMAL LOW (ref >90)
Glucose, Bld: 202 mg/dL — ABNORMAL HIGH (ref 70–99)
Potassium: 4.3 mEq/L (ref 3.5–5.1)
Sodium: 131 mEq/L — ABNORMAL LOW (ref 135–145)
Total Bilirubin: 0.3 mg/dL (ref 0.3–1.2)
Total Protein: 7.2 g/dL (ref 6.0–8.3)

## 2012-02-03 LAB — URINE MICROSCOPIC-ADD ON

## 2012-02-03 LAB — URINALYSIS, ROUTINE W REFLEX MICROSCOPIC
Ketones, ur: NEGATIVE mg/dL
Nitrite: NEGATIVE
Protein, ur: 100 mg/dL — AB
pH: 5.5 (ref 5.0–8.0)

## 2012-02-03 MED ORDER — LEVOFLOXACIN IN D5W 750 MG/150ML IV SOLN
750.0000 mg | INTRAVENOUS | Status: DC
  Administered 2012-02-04 (×2): 750 mg via INTRAVENOUS
  Filled 2012-02-03 (×2): qty 150

## 2012-02-03 MED ORDER — ALBUTEROL SULFATE (5 MG/ML) 0.5% IN NEBU: 2.5000 mg | INHALATION_SOLUTION | RESPIRATORY_TRACT | Status: DC | PRN

## 2012-02-03 MED ORDER — IPRATROPIUM BROMIDE 0.02 % IN SOLN: 0.5000 mg | RESPIRATORY_TRACT | Status: DC | PRN

## 2012-02-03 MED ORDER — GI COCKTAIL ~~LOC~~
30.0000 mL | Freq: Once | ORAL | Status: AC
  Administered 2012-02-04: 30 mL via ORAL
  Filled 2012-02-03: qty 30

## 2012-02-03 MED ORDER — ACETAMINOPHEN 650 MG RE SUPP: 650.0000 mg | Freq: Four times a day (QID) | RECTAL | Status: DC | PRN

## 2012-02-03 MED ORDER — SODIUM CHLORIDE 0.9 % IV BOLUS (SEPSIS)
1000.0000 mL | Freq: Once | INTRAVENOUS | Status: AC
  Administered 2012-02-03: 1000 mL via INTRAVENOUS

## 2012-02-03 MED ORDER — CLONAZEPAM 0.5 MG PO TABS
0.5000 mg | ORAL_TABLET | ORAL | Status: DC
  Administered 2012-02-04 – 2012-02-06 (×3): 0.5 mg via ORAL
  Filled 2012-02-03 (×3): qty 1

## 2012-02-03 MED ORDER — SODIUM CHLORIDE 0.9 % IV SOLN
INTRAVENOUS | Status: DC
  Administered 2012-02-03: 1000 mL via INTRAVENOUS

## 2012-02-03 MED ORDER — PROCHLORPERAZINE MALEATE 10 MG PO TABS
10.0000 mg | ORAL_TABLET | Freq: Four times a day (QID) | ORAL | Status: DC | PRN
  Filled 2012-02-03: qty 1

## 2012-02-03 MED ORDER — SODIUM CHLORIDE 0.9 % IV SOLN
INTRAVENOUS | Status: DC
  Administered 2012-02-03 – 2012-02-04 (×2): via INTRAVENOUS

## 2012-02-03 MED ORDER — METHYLPREDNISOLONE SODIUM SUCC 125 MG IJ SOLR
125.0000 mg | Freq: Once | INTRAMUSCULAR | Status: AC
  Administered 2012-02-03: 125 mg via INTRAVENOUS
  Filled 2012-02-03: qty 2

## 2012-02-03 MED ORDER — IPRATROPIUM BROMIDE 0.02 % IN SOLN
0.5000 mg | Freq: Four times a day (QID) | RESPIRATORY_TRACT | Status: DC
  Administered 2012-02-03 – 2012-02-04 (×5): 0.5 mg via RESPIRATORY_TRACT
  Filled 2012-02-03 (×4): qty 2.5

## 2012-02-03 MED ORDER — TRAZODONE HCL 100 MG PO TABS
100.0000 mg | ORAL_TABLET | Freq: Every day | ORAL | Status: DC
  Administered 2012-02-03 – 2012-02-05 (×3): 100 mg via ORAL
  Filled 2012-02-03 (×4): qty 1

## 2012-02-03 MED ORDER — ALBUTEROL SULFATE (5 MG/ML) 0.5% IN NEBU
5.0000 mg | INHALATION_SOLUTION | Freq: Once | RESPIRATORY_TRACT | Status: AC
  Administered 2012-02-03: 5 mg via RESPIRATORY_TRACT

## 2012-02-03 MED ORDER — ONDANSETRON HCL 4 MG PO TABS: 4.0000 mg | ORAL_TABLET | Freq: Four times a day (QID) | ORAL | Status: DC | PRN

## 2012-02-03 MED ORDER — ALUM & MAG HYDROXIDE-SIMETH 200-200-20 MG/5ML PO SUSP
30.0000 mL | ORAL | Status: DC | PRN
  Administered 2012-02-04: 30 mL via ORAL
  Filled 2012-02-03: qty 30

## 2012-02-03 MED ORDER — ARIPIPRAZOLE 2 MG PO TABS
2.0000 mg | ORAL_TABLET | Freq: Every day | ORAL | Status: DC
  Administered 2012-02-03 – 2012-02-06 (×4): 2 mg via ORAL
  Filled 2012-02-03 (×4): qty 1

## 2012-02-03 MED ORDER — ALBUTEROL SULFATE (5 MG/ML) 0.5% IN NEBU
2.5000 mg | INHALATION_SOLUTION | Freq: Four times a day (QID) | RESPIRATORY_TRACT | Status: DC
  Administered 2012-02-03 – 2012-02-04 (×5): 2.5 mg via RESPIRATORY_TRACT
  Filled 2012-02-03 (×5): qty 0.5

## 2012-02-03 MED ORDER — ACETAMINOPHEN 325 MG PO TABS: 650.0000 mg | ORAL_TABLET | Freq: Four times a day (QID) | ORAL | Status: DC | PRN

## 2012-02-03 MED ORDER — ALBUTEROL (5 MG/ML) CONTINUOUS INHALATION SOLN
10.0000 mg/h | INHALATION_SOLUTION | Freq: Once | RESPIRATORY_TRACT | Status: AC
  Administered 2012-02-03: 10 mg/h via RESPIRATORY_TRACT

## 2012-02-03 MED ORDER — METHYLPREDNISOLONE SODIUM SUCC 125 MG IJ SOLR
60.0000 mg | Freq: Two times a day (BID) | INTRAMUSCULAR | Status: DC
  Administered 2012-02-04 – 2012-02-06 (×5): 60 mg via INTRAVENOUS
  Filled 2012-02-03 (×7): qty 0.96

## 2012-02-03 MED ORDER — ONDANSETRON HCL 4 MG/2ML IJ SOLN: 4.0000 mg | Freq: Four times a day (QID) | INTRAMUSCULAR | Status: DC | PRN

## 2012-02-03 MED ORDER — ESCITALOPRAM OXALATE 20 MG PO TABS
20.0000 mg | ORAL_TABLET | ORAL | Status: DC
  Administered 2012-02-04 – 2012-02-06 (×3): 20 mg via ORAL
  Filled 2012-02-03 (×4): qty 1

## 2012-02-03 NOTE — Progress Notes (Signed)
ANTIBIOTIC CONSULT NOTE - INITIAL  Pharmacy Consult for Levaquin Indication: Pneumonia  Allergies  Allergen Reactions  . Codeine Nausea And Vomiting    Patient Measurements: Height: 5\' 11"  (180.3 cm) Weight: 185 lb (83.915 kg) IBW/kg (Calculated) : 75.3  Adjusted Body Weight:   Vital Signs: Temp: 98.7 F (37.1 C) (01/13 1618) Temp src: Oral (01/13 1618) BP: 104/62 mmHg (01/13 1618) Pulse Rate: 108  (01/13 1618) Intake/Output from previous day:   Intake/Output from this shift: Total I/O In: -  Out: 500 [Urine:500]  Labs:  [facility] 02/03/12 1251  WBC 9.1  HGB 16.2  PLT 126*  LABCREA --  CREATININE 1.01   Estimated Creatinine Clearance: 85.9 ml/min (by C-G formula based on Cr of 1.01). No results found for this basename: VANCOTROUGH:2,VANCOPEAK:2,VANCORANDOM:2,GENTTROUGH:2,GENTPEAK:2,GENTRANDOM:2,TOBRATROUGH:2,TOBRAPEAK:2,TOBRARND:2,AMIKACINPEAK:2,AMIKACINTROU:2,AMIKACIN:2, in the last 72 hours   Microbiology: No results found for this or any previous visit (from the past 720 hour(s)).  Medical History: Past Medical History  Diagnosis Date  . Inguinal hernia   . Colon tumor   . Depression     Bipolar disorder  . Hemorrhoids   . Full dentures   . Cancer 04/11/11    adenocarcinoma of colon, 7/19 nodes pos.  . Rib fractures     hx of  . Colon cancer 04/11/2011    Medications:  Scheduled:    . albuterol  2.5 mg Nebulization Q6H  . [COMPLETED] albuterol  5 mg Nebulization Once  . [COMPLETED] albuterol  10 mg/hr Nebulization Once  . ARIPiprazole  2 mg Oral Daily  . clonazePAM  0.5 mg Oral BH-q7a  . escitalopram  20 mg Oral BH-q7a  . ipratropium  0.5 mg Nebulization Q6H  . [COMPLETED] methylPREDNISolone (SOLU-MEDROL) injection  125 mg Intravenous Once  . methylPREDNISolone (SOLU-MEDROL) injection  60 mg Intravenous Q12H  . traZODone  100 mg Oral QHS   Infusions:    . sodium chloride 1,000 mL (02/03/12 1452)  . sodium chloride    . levofloxacin  (LEVAQUIN) IV    . [COMPLETED] sodium chloride Stopped (02/03/12 1451)   PRN: acetaminophen, acetaminophen, albuterol, ipratropium, ondansetron (ZOFRAN) IV, ondansetron, prochlorperazine Assessment:  57 yo M with Pneumonia  Hx- Stage III adenocarcinoma of rectum, sigmoid colon  Sx-SOB secondary to COPD/asthma  Completed Folfox series 10/22/11   Goal of Therapy:  Eradication of infection  Plan:  Begin Levaquin 750mg  IV q 24 hours. Follow up culture results  Loletta Specter 02/03/2012,5:22 PM

## 2012-02-03 NOTE — ED Notes (Signed)
ZOX:WRUE<AV> Expected date:<BR> Expected time:<BR> Means of arrival:<BR> Comments:<BR> SOB

## 2012-02-03 NOTE — H&P (Addendum)
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 CT scan chest 05/15/2011 showed no evidence of metastatic disease in the chest. His preoperative CEA was 2.4 in March 2013 and again normal in October 2013. Patient presented today with worsening shortness of breath over past few days prior to this admission associated with cough and subjective fever patient reported productive cough, yellowish sputum. Patient tried using albuterol with only minimal symptomatic relief. Patient decided to come to emergency for further evaluation. In ED, evaluation included chest x-ray which showed emphysematous changes consistent with COPD and/or asthma. Patient was given nebulizer treatments and Solu-Medrol in emergency room but continued to be short of breath with wheezing on physical exam.  Assessment and plan:  Principal problem: *Shortness of breath  Likely secondary to COPD/asthma exacerbation  Chest x-ray with findings of emphysematous changes, COPD, asthma  We will start nebulizer treatments every 6 hours scheduled and every 2 hours as needed  Solu-Medrol 60 mg every 12 hours IV  Continue oxygen support and nasal cannula to keep oxygen saturation above 90%  Levaquin for possible pneumonia  Active problems: Rectal, sigmoid colon adenocarcinoma  Patient has completed final cycle of FOLFOX chemotherapy 10/22/2011  As noted above CT scan chest 05/15/2011 showed no evidence of metastatic disease in the chest. Mild thrombocytopenia  No signs of active bleed  Platelet count  126 on admission  We'll continue to monitor CBC  Code Status: Full Family Communication: Pt at bedside Disposition Plan: Admit for further evaluation  Manson Passey, MD  [facility] Pager [phone]  If 7PM-7AM, please contact night-coverage [email or web address] Password [facility] 02/03/2012, 4:43 PM   Review of Systems:  Constitutional: Negative for fever, chills and malaise/fatigue. Negative for diaphoresis.  HENT: Negative for hearing loss, ear pain, nosebleeds, congestion, sore throat, neck pain, tinnitus and ear discharge.   Eyes: Negative for blurred vision, double vision, photophobia, pain, discharge and redness.  Respiratory: Per history of present illness.   Cardiovascular: Negative for chest pain, palpitations, orthopnea, claudication and leg swelling.  Gastrointestinal: Negative for nausea, vomiting and abdominal pain. Negative for heartburn, constipation, blood in stool and melena.  Genitourinary: Negative for dysuria, urgency, frequency, hematuria and flank pain.  Musculoskeletal: Negative for myalgias, back pain, joint pain and falls.  Skin: Negative for itching and rash.  Neurological: Negative for dizziness and weakness. Negative for tingling, tremors, sensory change, speech change, focal weakness, loss of consciousness and headaches.  Endo/Heme/Allergies: Negative for environmental allergies and polydipsia. Does not bruise/bleed easily.  Psychiatric/Behavioral: Negative for suicidal ideas. The patient is not nervous/anxious.      Past Medical History  Diagnosis Date  . Inguinal hernia   . Colon tumor   . Depression     Bipolar disorder  . Hemorrhoids   . Full dentures   . Cancer 04/11/11    adenocarcinoma of colon, 7/19 nodes pos.  . Rib fractures     hx of  . Colon cancer 04/11/2011   Past Surgical History  Procedure Date  . Colostomy revision 04/11/2011    Procedure: COLON RESECTION SIGMOID;  Surgeon: Velora Heckler, MD;  Location: [facility];  Service: General;  Laterality: N/A;  low anterior colon resection   . Colon surgery 04/11/11    Sigmoid colectomy  . Portacath placement   . Portacath placement 05/02/2011    Procedure: INSERTION PORT-A-CATH;  Surgeon: Velora Heckler, MD;  Location: [facility];  Service: General;  Laterality: N/A;   Social History:  reports that he has been smoking.  He has quit using smokeless tobacco. He reports that he drinks alcohol. He reports that he does not use illicit drugs.  Allergies  Allergen Reactions  . Codeine Nausea And Vomiting    Family History:  Family History  Problem Relation Age of Onset  . Heart disease Father      Prior to Admission medications   Medication Sig Start Date End Date Taking? Authorizing Provider  ARIPiprazole (ABILIFY) 2 MG tablet Take 2 mg by mouth daily.   Yes Historical Provider, MD  clonazePAM (KLONOPIN) 0.5 MG tablet Take 0.5 mg by mouth every morning.   Yes Historical Provider, MD  escitalopram (LEXAPRO) 20 MG tablet Take 20 mg by mouth every morning.    Yes Historical Provider, MD  lidocaine-prilocaine (EMLA) cream Apply topically as needed. Apply to [facility] site 1-2 hours prior to stick and cover with Plastic wrap to numb site 05/15/11  Yes Ladene Artist, MD  Loperamide HCl (IMODIUM PO) Take 2 capsules by mouth 4 (four) times daily as needed. Diarrhea.   Yes Historical Provider, MD  prochlorperazine (COMPAZINE) 10 MG tablet Take 1 tablet (10 mg total) by mouth every 6 (six) hours as needed (nausea). 05/15/11  Yes Ladene Artist, MD  traZODone (DESYREL) 100 MG tablet Take 100 mg by mouth at bedtime.   Yes Historical Provider, MD   Physical Exam: Filed Vitals:   02/03/12 1400 02/03/12 1500 02/03/12 1530 02/03/12 1618  BP: 134/79 129/78 99/63 104/62  Pulse: 115 108 120 108  Temp:    98.7 F (37.1 C)  TempSrc:    Oral  Resp: 30 30 29 28   SpO2: 95% 94% 96% 95%    Physical Exam  Constitutional: Appears well-developed and well-nourished. No distress.  HENT: Normocephalic. External right and left  ear normal. Oropharynx is clear and moist.  Eyes: Conjunctivae and EOM are normal. PERRLA, no scleral icterus.  Neck: Normal ROM. Neck supple. No JVD. No tracheal deviation. No thyromegaly.  CVS: RRR, S1/S2 +, no murmurs, no gallops, no carotid bruit.  Pulmonary: Wheezing and rhonchi in upper lobes, bilateral air entry.  Abdominal: Soft. BS +,  no distension, tenderness, rebound or guarding.  Musculoskeletal: Normal range of motion. No edema and no tenderness.  Lymphadenopathy: No lymphadenopathy noted, cervical, inguinal. Neuro: Alert. Normal reflexes, muscle tone coordination. No cranial nerve deficit. Skin: Skin is warm and dry. No rash noted. Not diaphoretic. No erythema. No pallor.  Psychiatric: Normal mood and affect. Behavior, judgment, thought content normal.   Labs on Admission:  Basic Metabolic Panel:  Lab 02/03/12 8469  NA 131*  K 4.3  CL 93*  CO2 30  GLUCOSE 202*  BUN 15  CREATININE 1.01  CALCIUM 9.1  MG --  PHOS --   Liver Function Tests:  Lab 02/03/12 1251  AST 32  ALT 23  ALKPHOS 81  BILITOT 0.3  PROT 7.2  ALBUMIN 4.0   No results found for this basename: LIPASE:5,AMYLASE:5 in the last 168 hours No results found for this basename: AMMONIA:5 in the last 168 hours CBC:  Lab 02/03/12 1251  WBC 9.1  NEUTROABS 7.6  HGB 16.2  HCT 46.6  MCV 97.5  PLT 126*   Cardiac Enzymes: No results found for this basename: CKTOTAL:5,CKMB:5,CKMBINDEX:5,TROPONINI:5 in the last 168 hours BNP: No components found with this basename: POCBNP:5 CBG: No results found for this basename: GLUCAP:5 in the last 168 hours  Radiological Exams on Admission: Dg Chest 2 View  02/03/2012  *RADIOLOGY REPORT*  Clinical Data: Pain and shortness of breath  CHEST - 2 VIEW  Comparison: CT chest 05/15/2011 and chest radiograph 05/02/2011  Findings: Cardiac leads project over the chest.  The lungs are hyperinflated and there is flattening of the hemidiaphragms.  The heart and mediastinal  contours are stable and within normal limits. There is no focal airspace disease, edema, or pneumothorax. Negative for pleural effusion.  Left subclavian power Port-A-Cath terminates in the mid superior vena cava.  Healed right-sided rib fractures.  No acute bony abnormality identified.  IMPRESSION: 1.  Hyperinflation of the lungs.  This could reflect COPD/emphysema or asthma.  The lungs are clear. 2.  Left subclavian Port-A-Cath in place.   Original Report Authenticated By: Britta Mccreedy, M.D.     EKG: Normal sinus rhythm, no ST/T wave changes  Time spent: 75 minutes

## 2012-02-03 NOTE — ED Provider Notes (Signed)
History     CSN: [id number]  Arrival date & time 02/03/12  1159   First MD Initiated Contact with Patient 02/03/12 1226      Chief Complaint  Patient presents with  . Shortness of Breath    (Consider location/radiation/quality/duration/timing/severity/associated sxs/prior treatment) Patient is a 57 y.o. male presenting with shortness of breath. The history is provided by the patient.  Shortness of Breath  Associated symptoms include shortness of breath.   patient here with cough fever shortness of breath x2 days. Some post tussive emesis. EMS was called and patient given albuterol with some improvement of his symptoms. He has noted wheezing as well and his oxygen sats were 92% room air prior to arrival. No current diarrhea. No anginal type chest pain. Denies any urinary symptoms.  Past Medical History  Diagnosis Date  . Inguinal hernia   . Colon tumor   . Depression     Bipolar disorder  . Hemorrhoids   . Full dentures   . Cancer 04/11/11    adenocarcinoma of colon, 7/19 nodes pos.  . Rib fractures     hx of  . Colon cancer 04/11/2011    Past Surgical History  Procedure Date  . Colostomy revision 04/11/2011    Procedure: COLON RESECTION SIGMOID;  Surgeon: Velora Heckler, MD;  Location: [facility];  Service: General;  Laterality: N/A;  low anterior colon resection   . Colon surgery 04/11/11    Sigmoid colectomy  . Portacath placement   . Portacath placement 05/02/2011    Procedure: INSERTION PORT-A-CATH;  Surgeon: Velora Heckler, MD;  Location: [facility];  Service: General;  Laterality: N/A;    Family History  Problem Relation Age of Onset  . Heart disease Father     History  Substance Use Topics  . Smoking status: Current Every Day Smoker -- 1.0 packs/day for 38 years  . Smokeless tobacco: Former Neurosurgeon  . Alcohol Use: Yes     Comment:  weekends      Review of Systems  Respiratory: Positive for shortness of breath.   All other systems reviewed and are  negative.    Allergies  Codeine  Home Medications   Current Outpatient Rx  Name  Route  Sig  Dispense  Refill  . ARIPIPRAZOLE 10 MG PO TABS   Oral   Take 10 mg by mouth every morning.          Marland Kitchen CLONAZEPAM 0.5 MG PO TABS   Oral   Take 0.5 mg by mouth every morning.         Marland Kitchen ESCITALOPRAM OXALATE 20 MG PO TABS   Oral   Take 20 mg by mouth every morning.          Marland Kitchen LIDOCAINE-PRILOCAINE 2.5-2.5 % EX CREA   Topical   Apply topically as needed. Apply to [facility] site 1-2 hours prior to stick and cover with Plastic wrap to numb site   30 g   prn   . IMODIUM PO   Oral   Take 2 capsules by mouth 4 (four) times daily as needed.         . OXYCODONE-ACETAMINOPHEN 5-325 MG PO TABS   Oral   Take 1 tablet by mouth 2 (two) times daily as needed for pain.   30 tablet   0   . PROCHLORPERAZINE MALEATE 10 MG PO TABS   Oral   Take 1 tablet (10 mg total) by mouth every 6 (six) hours as needed (nausea).  60 tablet   1   . TRAZODONE HCL 100 MG PO TABS   Oral   Take 100 mg by mouth at bedtime.           BP 103/79  Pulse 105  Temp 98.2 F (36.8 C) (Oral)  Resp 34  SpO2 100%  Physical Exam  Nursing note and vitals reviewed. Constitutional: He is oriented to person, place, and time. He appears well-developed and well-nourished.  Non-toxic appearance. No distress.  HENT:  Head: Normocephalic and atraumatic.  Eyes: Conjunctivae normal, EOM and lids are normal. Pupils are equal, round, and reactive to light.  Neck: Normal range of motion. Neck supple. No tracheal deviation present. No mass present.  Cardiovascular: Regular rhythm and normal heart sounds.  Tachycardia present.  Exam reveals no gallop.   No murmur heard. Pulmonary/Chest: No stridor. Tachypnea noted. No respiratory distress. He has decreased breath sounds. He has wheezes. He has no rhonchi. He has no rales.  Abdominal: Soft. Normal appearance and bowel sounds are normal. He exhibits no distension. There is no  tenderness. There is no rebound and no CVA tenderness.  Musculoskeletal: Normal range of motion. He exhibits no edema and no tenderness.  Neurological: He is alert and oriented to person, place, and time. He has normal strength. No cranial nerve deficit or sensory deficit. GCS eye subscore is 4. GCS verbal subscore is 5. GCS motor subscore is 6.  Skin: Skin is warm and dry. No abrasion and no rash noted.  Psychiatric: He has a normal mood and affect. His speech is normal and behavior is normal.    ED Course  Procedures (including critical care time)   Labs Reviewed  CBC WITH DIFFERENTIAL  COMPREHENSIVE METABOLIC PANEL  URINALYSIS, ROUTINE W REFLEX MICROSCOPIC  URINE CULTURE   No results found.   No diagnosis found.    MDM  Patient given multiple albuterol treatments with some improvement. Also given Solu-Medrol. He is rechecked multiple times and will need to be admitted for a likely COPD exacerbation   CRITICAL CARE Performed by: Toy Baker   Total critical care time: 60  Critical care time was exclusive of separately billable procedures and treating other patients.  Critical care was necessary to treat or prevent imminent or life-threatening deterioration.  Critical care was time spent personally by me on the following activities: development of treatment plan with patient and/or surrogate as well as nursing, discussions with consultants, evaluation of patient's response to treatment, examination of patient, obtaining history from patient or surrogate, ordering and performing treatments and interventions, ordering and review of laboratory studies, ordering and review of radiographic studies, pulse oximetry and re-evaluation of patient's condition.         Toy Baker, MD 02/03/12 [phone]

## 2012-02-03 NOTE — ED Notes (Signed)
Per EMS pt c/o SOB x2days with fever 102, no fever today, sats 92% on RA; pt given Albuterol 5mg  neb with o2 sats increasing to 99%

## 2012-02-04 DIAGNOSIS — D696 Thrombocytopenia, unspecified: Secondary | ICD-10-CM

## 2012-02-04 DIAGNOSIS — T380X5A Adverse effect of glucocorticoids and synthetic analogues, initial encounter: Secondary | ICD-10-CM | POA: Diagnosis present

## 2012-02-04 DIAGNOSIS — R7309 Other abnormal glucose: Secondary | ICD-10-CM

## 2012-02-04 LAB — COMPREHENSIVE METABOLIC PANEL
ALT: 20 U/L (ref 0–53)
AST: 30 U/L (ref 0–37)
Albumin: 3.4 g/dL — ABNORMAL LOW (ref 3.5–5.2)
Alkaline Phosphatase: 64 U/L (ref 39–117)
BUN: 16 mg/dL (ref 6–23)
Chloride: 100 mEq/L (ref 96–112)
Potassium: 4.1 mEq/L (ref 3.5–5.1)
Sodium: 136 mEq/L (ref 135–145)
Total Protein: 6.4 g/dL (ref 6.0–8.3)

## 2012-02-04 LAB — CBC
HCT: 41.4 % (ref 39.0–52.0)
MCH: 32.9 pg (ref 26.0–34.0)
MCV: 96.7 fL (ref 78.0–100.0)
RDW: 12.6 % (ref 11.5–15.5)
WBC: 10.9 10*3/uL — ABNORMAL HIGH (ref 4.0–10.5)

## 2012-02-04 LAB — URINE CULTURE: Colony Count: NO GROWTH

## 2012-02-04 MED ORDER — IPRATROPIUM BROMIDE 0.02 % IN SOLN
0.5000 mg | Freq: Four times a day (QID) | RESPIRATORY_TRACT | Status: DC
  Administered 2012-02-05 – 2012-02-06 (×5): 0.5 mg via RESPIRATORY_TRACT
  Filled 2012-02-04 (×5): qty 2.5

## 2012-02-04 MED ORDER — PANTOPRAZOLE SODIUM 40 MG PO TBEC
40.0000 mg | DELAYED_RELEASE_TABLET | Freq: Two times a day (BID) | ORAL | Status: DC
  Administered 2012-02-04 – 2012-02-06 (×5): 40 mg via ORAL
  Filled 2012-02-04 (×6): qty 1

## 2012-02-04 MED ORDER — ALBUTEROL SULFATE (5 MG/ML) 0.5% IN NEBU
2.5000 mg | INHALATION_SOLUTION | Freq: Four times a day (QID) | RESPIRATORY_TRACT | Status: DC
  Administered 2012-02-05 – 2012-02-06 (×5): 2.5 mg via RESPIRATORY_TRACT
  Filled 2012-02-04 (×5): qty 0.5

## 2012-02-04 NOTE — Evaluation (Signed)
Physical Therapy Evaluation Patient Details Name: Taylor [name] MRN: [id number] DOB: Oct 29, 1955 Today's Date: 02/04/2012 Time: 4098-1191 PT Time Calculation (min): 13 min  PT Assessment / Plan / Recommendation Clinical Impression  Pt admitted for SOB likely secondary to COPD/asthma exacerbation.  Pt initially very SOB with ambulation however reported SOB became better upon continuing ambulation.  Pt would benefit from acute PT services in order to improve activity tolerance and independence with ambulation to prepare pt for d/c home.    PT Assessment  Patient needs continued PT services    Follow Up Recommendations  No PT follow up    Does the patient have the potential to tolerate intense rehabilitation      Barriers to Discharge        Equipment Recommendations  None recommended by PT    Recommendations for Other Services     Frequency Min 3X/week    Precautions / Restrictions Precautions Precaution Comments: DOE Restrictions Weight Bearing Restrictions: No   Pertinent Vitals/Pain No pain SaO2 at rest on room air 96% SaO2 ambulating on room air 93% Reapplied 2L [place] upon return to room due to SOB and fatigue      Mobility  Bed Mobility Bed Mobility: Supine to Sit Supine to Sit: 6: Modified independent (Device/Increase time) Transfers Transfers: Stand to Sit;Sit to Stand Sit to Stand: 4: Min guard;With upper extremity assist;From bed Stand to Sit: 4: Min guard;With upper extremity assist;To chair/3-in-1 Ambulation/Gait Ambulation/Gait Assistance: 4: Min guard Ambulation Distance (Feet): 400 Feet (total) Assistive device: None Ambulation/Gait Assistance Details: pt given cues for taking rest breaks as needed, pt very SOB initially with ambulation and required standing short rest break after 100 feet Gait Pattern: Step-through pattern Gait velocity: decreased General Gait Details: 2/4 dyspnea    Shoulder Instructions     Exercises     PT Diagnosis:  Difficulty walking  PT Problem List: Decreased activity tolerance;Decreased mobility;Cardiopulmonary status limiting activity PT Treatment Interventions: DME instruction;Gait training;Functional mobility training;Therapeutic activities;Therapeutic exercise;Patient/family education   PT Goals Acute Rehab PT Goals PT Goal Formulation: With patient Time For Goal Achievement: 02/11/12 Potential to Achieve Goals: Good Pt will go Sit to Stand: with modified independence PT Goal: Sit to Stand - Progress: Goal set today Pt will Ambulate: >150 feet;with modified independence;Other (comment) (dyspnea 1/4) PT Goal: Ambulate - Progress: Goal set today Pt will Perform Home Exercise Program: with supervision, verbal cues required/provided PT Goal: Perform Home Exercise Program - Progress: Goal set today  Visit Information  Last PT Received On: 02/04/12 Assistance Needed: +1    Subjective Data  Subjective: I moved in to take care of my mom but she's now taking care of me.   Prior Functioning  Home Living Lives With: Other (Comment) (mother) Type of Home: House Home Access: Level entry Home Layout: One level Home Adaptive Equipment: None Additional Comments: Pt reports he does not wear oxygen at home. Prior Function Level of Independence: Independent Communication Communication: No difficulties    Cognition  Overall Cognitive Status: Appears within functional limits for tasks assessed/performed Arousal/Alertness: Awake/alert Orientation Level: Appears intact for tasks assessed Behavior During Session: [facility] for tasks performed    Extremity/Trunk Assessment Right Upper Extremity Assessment RUE ROM/Strength/Tone: [facility] for tasks assessed Left Upper Extremity Assessment LUE ROM/Strength/Tone: [facility] for tasks assessed Right Lower Extremity Assessment RLE ROM/Strength/Tone: [facility] for tasks assessed Left Lower Extremity Assessment LLE ROM/Strength/Tone: [facility] for tasks assessed   Balance    End of  Session PT - End of Session  Activity Tolerance: Patient tolerated treatment well Patient left: in chair;with call bell/phone within reach  GP     [facility] E 02/04/2012, 11:05 AM Pager: [phone]

## 2012-02-04 NOTE — Care Management Note (Signed)
    Page 1 of 1   02/06/2012     1:28:51 PM   CARE MANAGEMENT NOTE 02/06/2012  Patient:  Taylor [name], Taylor [name]   Account Number:  [id number]  Date Initiated:  02/04/2012  Documentation initiated by:  Lanier Clam  Subjective/Objective Assessment:   ADMITTED W/SOB.COPD.ZO:[id number] ADENO CA RECTUM,BIPOLAR.     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   Anticipated DC Date:  02/06/2012   Anticipated DC Plan:  HOME/SELF CARE      DC Planning Services  CM consult      Choice offered to / List presented to:             Status of service:  Completed, signed off Medicare Important Message given?   (If response is "NO", the following Medicare IM given date fields will be blank) Date Medicare IM given:   Date Additional Medicare IM given:    Discharge Disposition:  HOME/SELF CARE  Per UR Regulation:  Reviewed for med. necessity/level of care/duration of stay  If discussed at Long Length of Stay Meetings, dates discussed:    Comments:  02/06/12 Shellene Sweigert NR,BSN NCM [phone] D/C HOME.NO NEEDS.  02/04/12 Lavone Weisel RN,BSN NCM [phone] PT-NO F/U.NOTED 02 SATS 96% @ REST ON RA.

## 2012-02-04 NOTE — Progress Notes (Addendum)
[facility] PROGRESS NOTE  Taylor [name] [id number]:[id number] DOB: 10-11-1955 DOA: 02/03/2012 PCP: Lynne Leader, MD  Assessment/Plan: Shortness of breath  Likely secondary to COPD/asthma exacerbation  Chest x-ray with findings of emphysematous changes, COPD, asthma  will continue nebulized bronchodilators, abx and solumedrol- follow and taper as appropriate Continue oxygen support and nasal cannula to keep oxygen saturation above 90%   Active problems:  Rectal, sigmoid colon adenocarcinoma  Patient has completed final cycle of FOLFOX chemotherapy 10/22/2011  As noted above CT scan chest 05/15/2011 showed no evidence of metastatic disease in the chest. Mild thrombocytopenia  No gross bleeding              Platelet count 126 on admission >>117 on 1/14 Dyspepsia -add PPI Hyperglycemia - likely steroid induced, check hgbA1C follow and further treat accordingly    Code Status: full Family Communication: directly with pt at bedside Disposition Plan: to home when clinically improved   Consultants:  none  Procedures:  none  Antibiotics:  levaquin started on 1/13  HPI/Subjective: States breathing about the same, +cough, c/o heart burn after breakfast   Objective: Filed Vitals:   02/03/12 1932 02/03/12 2226 02/04/12 0550 02/04/12 0810  BP:  101/42 102/64   Pulse:  85 79   Temp:  99 F (37.2 C) 98.6 F (37 C)   TempSrc:  Oral Oral   Resp:  20 20   Height:      Weight:   82.8 kg (182 lb 8.7 oz)   SpO2: 96% 96% 96% 94%    Intake/Output Summary (Last 24 hours) at 02/04/12 1044 Last data filed at 02/04/12 1016  Gross per 24 hour  Intake 2073.75 ml  Output   1170 ml  Net 903.75 ml   Filed Weights   02/03/12 1719 02/03/12 1825 02/04/12 0550  Weight: 83.915 kg (185 lb) 82.7 kg (182 lb 5.1 oz) 82.8 kg (182 lb 8.7 oz)    Exam:   General: A&Ox3, in NAD  Cardiovascular: RRR, nl S1S2  Respiratory: Scattered rhonchi, no crackles  Abdomen: soft+BS,  NT/ND  Extremities: no cyanosis and no edema  Data Reviewed: Basic Metabolic Panel:  Lab 02/04/12 5784 02/03/12 1251  NA 136 131*  K 4.1 4.3  CL 100 93*  CO2 28 30  GLUCOSE 114* 202*  BUN 16 15  CREATININE 0.95 1.01  CALCIUM 8.7 9.1  MG -- --  PHOS -- --   Liver Function Tests:  Lab 02/04/12 0445 02/03/12 1251  AST 30 32  ALT 20 23  ALKPHOS 64 81  BILITOT 0.2* 0.3  PROT 6.4 7.2  ALBUMIN 3.4* 4.0   No results found for this basename: LIPASE:5,AMYLASE:5 in the last 168 hours No results found for this basename: AMMONIA:5 in the last 168 hours CBC:  Lab 02/04/12 0445 02/03/12 1251  WBC 10.9* 9.1  NEUTROABS -- 7.6  HGB 14.1 16.2  HCT 41.4 46.6  MCV 96.7 97.5  PLT 117* 126*   Cardiac Enzymes: No results found for this basename: CKTOTAL:5,CKMB:5,CKMBINDEX:5,TROPONINI:5 in the last 168 hours BNP (last 3 results) No results found for this basename: PROBNP:3 in the last 8760 hours CBG:  Lab 02/04/12 0726  GLUCAP 159*    No results found for this or any previous visit (from the past 240 hour(s)).   Studies: Dg Chest 2 View  02/03/2012  *RADIOLOGY REPORT*  Clinical Data: Pain and shortness of breath  CHEST - 2 VIEW  Comparison: CT chest 05/15/2011 and chest radiograph 05/02/2011  Findings: Cardiac  leads project over the chest.  The lungs are hyperinflated and there is flattening of the hemidiaphragms.  The heart and mediastinal contours are stable and within normal limits. There is no focal airspace disease, edema, or pneumothorax. Negative for pleural effusion.  Left subclavian power Port-A-Cath terminates in the mid superior vena cava.  Healed right-sided rib fractures.  No acute bony abnormality identified.  IMPRESSION: 1.  Hyperinflation of the lungs.  This could reflect COPD/emphysema or asthma.  The lungs are clear. 2.  Left subclavian Port-A-Cath in place.   Original Report Authenticated By: Britta Mccreedy, M.D.     Scheduled Meds:   . albuterol  2.5 mg  Nebulization Q6H  . ARIPiprazole  2 mg Oral Daily  . clonazePAM  0.5 mg Oral BH-q7a  . escitalopram  20 mg Oral BH-q7a  . ipratropium  0.5 mg Nebulization Q6H  . levofloxacin (LEVAQUIN) IV  750 mg Intravenous Q24H  . methylPREDNISolone (SOLU-MEDROL) injection  60 mg Intravenous Q12H  . traZODone  100 mg Oral QHS   Continuous Infusions:   . sodium chloride Stopped (02/03/12 1756)  . sodium chloride 75 mL/hr at 02/04/12 0017    Principal Problem:  *COPD (chronic obstructive pulmonary disease) Active Problems:  Colon cancer, sigmoid  Thrombocytopenia    Time spent: 35    [facility]  [facility] Pager [phone]. If 8PM-8AM, please contact night-coverage at [email or web address], password [facility] 02/04/2012, 10:44 AM  LOS: 1 day

## 2012-02-05 ENCOUNTER — Encounter (HOSPITAL_COMMUNITY): Payer: Self-pay | Admitting: Internal Medicine

## 2012-02-05 DIAGNOSIS — F319 Bipolar disorder, unspecified: Secondary | ICD-10-CM

## 2012-02-05 DIAGNOSIS — F32A Depression, unspecified: Secondary | ICD-10-CM | POA: Diagnosis present

## 2012-02-05 DIAGNOSIS — R1013 Epigastric pain: Secondary | ICD-10-CM | POA: Diagnosis present

## 2012-02-05 DIAGNOSIS — F329 Major depressive disorder, single episode, unspecified: Secondary | ICD-10-CM | POA: Diagnosis present

## 2012-02-05 HISTORY — DX: Bipolar disorder, unspecified: F31.9

## 2012-02-05 LAB — HEMOGLOBIN A1C: Mean Plasma Glucose: 131 mg/dL — ABNORMAL HIGH (ref <117)

## 2012-02-05 LAB — BASIC METABOLIC PANEL
CO2: 28 mEq/L (ref 19–32)
Chloride: 104 mEq/L (ref 96–112)
Creatinine, Ser: 0.91 mg/dL (ref 0.50–1.35)
Glucose, Bld: 129 mg/dL — ABNORMAL HIGH (ref 70–99)
Sodium: 139 mEq/L (ref 135–145)

## 2012-02-05 LAB — GLUCOSE, CAPILLARY: Glucose-Capillary: 164 mg/dL — ABNORMAL HIGH (ref 70–99)

## 2012-02-05 MED ORDER — LEVOFLOXACIN 750 MG PO TABS
750.0000 mg | ORAL_TABLET | ORAL | Status: DC
  Administered 2012-02-05: 750 mg via ORAL
  Filled 2012-02-05 (×2): qty 1

## 2012-02-05 NOTE — Progress Notes (Signed)
Weaned pt off of oxygen.  Room air sat 97%.   Pt ambulated on room air and sats maintained between 90-94%.  MP[place],RN

## 2012-02-05 NOTE — Progress Notes (Signed)
PHARMACIST - PHYSICIAN COMMUNICATION DR:   Janee Morn CONCERNING: Antibiotic IV to Oral Route Change Policy  RECOMMENDATION: This patient is receiving Levaquin by the intravenous route.  Based on criteria approved by the Pharmacy and Therapeutics Committee, the antibiotic(s) is/are being converted to the equivalent oral dose form(s).   DESCRIPTION: These criteria include:  Patient being treated for a respiratory tract infection, urinary tract infection, or cellulitis  The patient is not neutropenic and does not exhibit a GI malabsorption state  The patient is eating (either orally or via tube) and/or has been taking other orally administered medications for a least 24 hours  The patient is improving clinically and has a Tmax < 100.5  If you have questions about this conversion, please contact the Pharmacy Department  []   [phone] )  [facility] []   [phone] )  [facility]  []   [phone] )  [facility] [x]   [phone] )  [facility]   Loralee Pacas, PharmD, BCPS 02/05/2012 11:42 AM

## 2012-02-05 NOTE — Progress Notes (Signed)
[facility] PROGRESS NOTE  Taylor [name] YQM:[id number] DOB: 09-01-1955 DOA: 02/03/2012 PCP: Lynne Leader, MD  Assessment/Plan:  #1 shortness of breath/probable acute COPD exacerbation Patient with clinical improvement. Chest x-ray findings with emphysematous changes. Continue IV Solu-Medrol taper, oral Levaquin, bronchodilators. Follow.  #2 history of rectosigmoid colon adenocarcinoma Patient has completed final cycle of FOLFOX chemotherapy 10/22/2011. CT scan of the chest done 05/15/2011 with no evidence of metastatic disease. Patient will need to followup as outpatient.  #3 mild thrombocytopenia No evidence of active bleeding. Platelet count was 117 on 02/04/2012. Follow.  #4 dyspepsia PPI  #5 hyperglycemia Likely steroid induced. Hemoglobin A1c 6.2. Follow CBGs.  #6 bipolar disorder/depression Stable. Continue home regimen of Abilify, Lexapro, Klonopin.    Code Status: Full Family Communication: Updated patient no family at bedside. Disposition Plan: Home when medically stable in the next one to 2 days   Consultants:  None  Procedures:  None  Antibiotics:  Oral Levaquin 02/03/2012  HPI/Subjective: Patient states he's feeling much better.  Objective: Filed Vitals:   02/05/12 0500 02/05/12 0833 02/05/12 1427 02/05/12 1513  BP: 99/58   106/53  Pulse: 62   68  Temp: 97.6 F (36.4 C)   97.5 F (36.4 C)  TempSrc: Oral   Oral  Resp: 18   20  Height:      Weight: 82.9 kg (182 lb 12.2 oz)     SpO2: 97% 98% 98% 96%    Intake/Output Summary (Last 24 hours) at 02/05/12 1528 Last data filed at 02/05/12 1513  Gross per 24 hour  Intake   1310 ml  Output    830 ml  Net    480 ml   Filed Weights   02/03/12 1825 02/04/12 0550 02/05/12 0500  Weight: 82.7 kg (182 lb 5.1 oz) 82.8 kg (182 lb 8.7 oz) 82.9 kg (182 lb 12.2 oz)    Exam:   General:  NAD  Cardiovascular: RRR  Respiratory: CTAB. Fair air movement.  Abdomen: Soft, nontender,  nondistended, positive bowel sounds  Data Reviewed: Basic Metabolic Panel:  Lab 02/05/12 5284 02/04/12 0445 02/03/12 1251  NA 139 136 131*  K 4.6 4.1 4.3  CL 104 100 93*  CO2 28 28 30   GLUCOSE 129* 114* 202*  BUN 18 16 15   CREATININE 0.91 0.95 1.01  CALCIUM 8.3* 8.7 9.1  MG -- -- --  PHOS -- -- --   Liver Function Tests:  Lab 02/04/12 0445 02/03/12 1251  AST 30 32  ALT 20 23  ALKPHOS 64 81  BILITOT 0.2* 0.3  PROT 6.4 7.2  ALBUMIN 3.4* 4.0   No results found for this basename: LIPASE:5,AMYLASE:5 in the last 168 hours No results found for this basename: AMMONIA:5 in the last 168 hours CBC:  Lab 02/04/12 0445 02/03/12 1251  WBC 10.9* 9.1  NEUTROABS -- 7.6  HGB 14.1 16.2  HCT 41.4 46.6  MCV 96.7 97.5  PLT 117* 126*   Cardiac Enzymes: No results found for this basename: CKTOTAL:5,CKMB:5,CKMBINDEX:5,TROPONINI:5 in the last 168 hours BNP (last 3 results) No results found for this basename: PROBNP:3 in the last 8760 hours CBG:  Lab 02/05/12 0732 02/04/12 0726  GLUCAP 164* 159*    Recent Results (from the past 240 hour(s))  URINE CULTURE     Status: Normal   Collection Time   02/03/12  1:04 PM      Component Value Range Status Comment   Specimen Description URINE, RANDOM   Final    Special Requests NONE  Final    Culture  Setup Time 02/03/2012 22:25   Final    Colony Count NO GROWTH   Final    Culture NO GROWTH   Final    Report Status 02/04/2012 FINAL   Final      Studies: No results found.  Scheduled Meds:    . albuterol  2.5 mg Nebulization Q6H WA  . ARIPiprazole  2 mg Oral Daily  . clonazePAM  0.5 mg Oral BH-q7a  . escitalopram  20 mg Oral BH-q7a  . ipratropium  0.5 mg Nebulization Q6H WA  . levofloxacin  750 mg Oral Q24H  . methylPREDNISolone (SOLU-MEDROL) injection  60 mg Intravenous Q12H  . pantoprazole  40 mg Oral BID  . traZODone  100 mg Oral QHS   Continuous Infusions:   Principal Problem:  *COPD (chronic obstructive pulmonary  disease) Active Problems:  Colon cancer, sigmoid  Thrombocytopenia  Hyperglycemia  Dyspepsia  Bipolar 1 disorder  Depression    Time spent: > 30 mins    [facility]  [facility] Pager [phone]. If 8PM-8AM, please contact night-coverage at [email or web address], password [facility] 02/05/2012, 3:28 PM  LOS: 2 days

## 2012-02-05 NOTE — Progress Notes (Signed)
Physical Therapy Treatment Patient Details Name: Taylor [name] MRN: [id number] DOB: Sep 10, 1955 Today's Date: 02/05/2012 Time: 1191-4782 PT Time Calculation (min): 24 min  PT Assessment / Plan / Recommendation Comments on Treatment Session  Pt states he feels better but srtill gets SOB with activity.  Amb in hallway twice with one sitting rest break.  Pt has a hx COPD and demon 3/4 DOE.  Instructed pt on proper purse lip breathing and deep coughing.  Pt plans to D/C back home with his mother.    Follow Up Recommendations  No PT follow up     Does the patient have the potential to tolerate intense rehabilitation     Barriers to Discharge        Equipment Recommendations  None recommended by PT    Recommendations for Other Services    Frequency Min 3X/week   Plan Discharge plan remains appropriate    Precautions / Restrictions   Hx COPD  Pertinent Vitals/Pain No c/o pain    Mobility  Bed Mobility Bed Mobility: Not assessed Details for Bed Mobility Assistance: Pt sitting EOB on arrival Transfers Transfers: Sit to Stand;Stand to Sit Sit to Stand: 5: Supervision;From bed Stand to Sit: 5: Supervision;To bed Details for Transfer Assistance: good safety tech/awareness Ambulation/Gait Ambulation/Gait Assistance: 5: Supervision Ambulation Distance (Feet): 500 Feet (250 x 2 sitting rest break) Assistive device: None Ambulation/Gait Assistance Details: <25% VC's on proper purse lip breathing and deep coughing.  Pt required one sitting rest break as he demon 3/4 DOE.  RA sats avg 90 - 94%.  Gait Pattern: Step-through pattern Gait velocity: WFL    PT Goals                                                   Progressing well    Visit Information  Last PT Received On: 02/05/12 Assistance Needed: +1    Subjective Data  Subjective: I hope to go home later today Patient Stated Goal: home   Cognition    good   Balance   good  End of Session PT - End of Session Equipment  Utilized During Treatment: Gait belt Activity Tolerance: Patient tolerated treatment well Patient left: in bed;with call bell/phone within reach   Felecia Shelling  PTA [facility]  Acute  Rehab Pager     [phone]

## 2012-02-06 DIAGNOSIS — K3189 Other diseases of stomach and duodenum: Secondary | ICD-10-CM

## 2012-02-06 LAB — BASIC METABOLIC PANEL
CO2: 29 mEq/L (ref 19–32)
Chloride: 103 mEq/L (ref 96–112)
Sodium: 140 mEq/L (ref 135–145)

## 2012-02-06 LAB — CBC
HCT: 41.3 % (ref 39.0–52.0)
MCV: 100 fL (ref 78.0–100.0)
RBC: 4.13 MIL/uL — ABNORMAL LOW (ref 4.22–5.81)
WBC: 8.2 10*3/uL (ref 4.0–10.5)

## 2012-02-06 MED ORDER — LEVOFLOXACIN 750 MG PO TABS: 750.0000 mg | ORAL_TABLET | ORAL | Status: AC

## 2012-02-06 MED ORDER — TIOTROPIUM BROMIDE MONOHYDRATE 18 MCG IN CAPS
18.0000 ug | ORAL_CAPSULE | Freq: Every day | RESPIRATORY_TRACT | Status: DC
  Administered 2012-02-06: 18 ug via RESPIRATORY_TRACT
  Filled 2012-02-06: qty 5

## 2012-02-06 MED ORDER — HEPARIN SOD (PORK) LOCK FLUSH 100 UNIT/ML IV SOLN
500.0000 [IU] | INTRAVENOUS | Status: AC | PRN
  Administered 2012-02-06: 500 [IU]

## 2012-02-06 MED ORDER — ALBUTEROL SULFATE HFA 108 (90 BASE) MCG/ACT IN AERS: 2.0000 | INHALATION_SPRAY | Freq: Four times a day (QID) | RESPIRATORY_TRACT | Status: DC | PRN

## 2012-02-06 MED ORDER — TIOTROPIUM BROMIDE MONOHYDRATE 18 MCG IN CAPS: 18.0000 ug | ORAL_CAPSULE | Freq: Every day | RESPIRATORY_TRACT | Status: DC

## 2012-02-06 MED ORDER — PREDNISONE 20 MG PO TABS: ORAL_TABLET | ORAL | Status: DC

## 2012-02-06 MED ORDER — BUDESONIDE-FORMOTEROL FUMARATE 160-4.5 MCG/ACT IN AERO
2.0000 | INHALATION_SPRAY | Freq: Two times a day (BID) | RESPIRATORY_TRACT | Status: DC
  Administered 2012-02-06: 2 via RESPIRATORY_TRACT
  Filled 2012-02-06: qty 6

## 2012-02-06 MED ORDER — BUDESONIDE-FORMOTEROL FUMARATE 160-4.5 MCG/ACT IN AERO: 2.0000 | INHALATION_SPRAY | Freq: Two times a day (BID) | RESPIRATORY_TRACT | Status: DC

## 2012-02-06 MED ORDER — PANTOPRAZOLE SODIUM 40 MG PO TBEC: 40.0000 mg | DELAYED_RELEASE_TABLET | Freq: Every day | ORAL | Status: DC

## 2012-02-06 NOTE — Discharge Summary (Signed)
Physician Discharge Summary  Taylor [name] [id number]:[id number] DOB: [birth year] DOA: 02/03/2012  PCP: Lynne Leader, MD  Admit date: 02/03/2012 Discharge date: 02/06/2012  Time spent: 60 minutes  Recommendations for Outpatient Follow-up:  1. Patient is to followup with PCP one week post discharge. On followup a basic metabolic profile will need to be obtained to followup on patient's electrolytes and renal function. Patient has been started on Spiriva and Symbicort during this hospitalization and his COPD exacerbation needs to be reassessed on followup. Patient may benefit from referral to pulmonologist as outpatient.  Discharge Diagnoses:  Principal Problem:  *COPD (chronic obstructive pulmonary disease) Active Problems:  Colon cancer, sigmoid  Thrombocytopenia  Hyperglycemia  Dyspepsia  Bipolar 1 disorder  Depression   Discharge Condition: Stable and improved  Diet recommendation: Regular  Filed Weights   02/04/12 0550 02/05/12 0500 02/06/12 0500  Weight: 82.8 kg (182 lb 8.7 oz) 82.9 kg (182 lb 12.2 oz) 84.3 kg (185 lb 13.6 oz)    History of present illness:  57 year old male with 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 CT scan chest 05/15/2011 showed no evidence of metastatic disease in the chest. His preoperative CEA was 2.4 in March 2013 and again normal in October 2013.  Patient presented today with worsening shortness of breath over past few days prior to this admission associated with cough and subjective fever patient reported productive cough, yellowish sputum. Patient tried using albuterol with only minimal symptomatic relief. Patient decided to come to emergency for further evaluation.  In ED, evaluation included chest x-ray which showed emphysematous changes consistent with  COPD and/or asthma. Patient was given nebulizer treatments and Solu-Medrol in emergency room but continued to be short of breath with wheezing on physical exam.   Hospital Course:  #1 acute COPD exacerbation Patient had presented with worsening shortness of breath a few days prior to admission with cough and subjective fevers of yellowish sputum. Patient was admitted to the telemetry floor he was placed on nebulizer treatments as well as IV Solu-Medrol. Bronchodilators were added to his regimen as well as oral Levaquin. Patient improved clinically on a daily basis his IV steroids were tapered down. Patient will be discharged home on a steroid taper, Spiriva, Symbicort, and albuterol MDI as well as 5 days of oral Levaquin. Patient be discharged home in stable and improved condition to followup with his PCP as outpatient.  #2 dyspepsia During the hospital admission patient did have complaints of dyspepsia. Patient was placed on Protonix with clinical improvement. Patient be discharged home on Protonix 40 mg daily.  #3 hyperglycemia During the hospitalization patient was noted to be hyperglycemic which was felt to be steroid induced. Hemoglobin A1c was checked which came back at 6.2. Patient's CBGs were followed and he was monitored. Is in need to be monitored as outpatient as his steroid taper down.  The rest of patient's chronic medical issues remained stable throughout the hospitalization the patient be discharged in stable and improved condition.  Procedures:  None  Consultations:  None  Discharge Exam: Filed Vitals:   02/06/12 0150 02/06/12 0500 02/06/12 0617 02/06/12 0847  BP:   142/86   Pulse:   70   Temp:   98 F (36.7 C)   TempSrc:   Oral   Resp:   18   Height:      Weight:  84.3 kg (185 lb 13.6 oz)    SpO2: 97%  94% 91%    General: NAD Cardiovascular: RRR. No LE edema Respiratory: CTAB. NO WHEEZING  Discharge Instructions  Discharge Orders    Future Appointments:  Provider: Department: Dept Phone: Center:   02/25/2012 10:00 AM [facility] [phone] None   03/31/2012 9:00 AM Krista Blue [facility] MEDICAL ONCOLOGY [phone] None   03/31/2012 9:30 AM Ladene Artist, MD [facility] MEDICAL ONCOLOGY [phone] None   04/07/2012 10:00 AM [facility] [phone] None     Future Orders Please Complete By Expires   Diet general      Increase activity slowly      Discharge instructions      Comments:   Follow up with [facility], MD in 1 week       Medication List     As of 02/06/2012 10:18 AM    TAKE these medications         albuterol 108 (90 BASE) MCG/ACT inhaler   Commonly known as: PROVENTIL HFA;VENTOLIN HFA   Inhale 2 puffs into the lungs every 6 (six) hours as needed for wheezing. Take 2 puffs 3 times daily x3 days then 2 puffs every 6 hours as needed for wheezing.      ARIPiprazole 2 MG tablet   Commonly known as: ABILIFY   Take 2 mg by mouth daily.      budesonide-formoterol 160-4.5 MCG/ACT inhaler   Commonly known as: SYMBICORT   Inhale 2 puffs into the lungs 2 (two) times daily.      clonazePAM 0.5 MG tablet   Commonly known as: KLONOPIN   Take 0.5 mg by mouth every morning.      escitalopram 20 MG tablet   Commonly known as: LEXAPRO   Take 20 mg by mouth every morning.      IMODIUM PO   Take 2 capsules by mouth 4 (four) times daily as needed. Diarrhea.      levofloxacin 750 MG tablet   Commonly known as: LEVAQUIN   Take 1 tablet (750 mg total) by mouth daily. Take for 5 days then stop.      lidocaine-prilocaine cream   Commonly known as: EMLA   Apply topically as needed. Apply to [facility] site 1-2 hours prior to stick and cover with Plastic wrap to numb site      pantoprazole 40 MG tablet   Commonly known as: PROTONIX   Take 1 tablet (40 mg total) by mouth daily.       predniSONE 20 MG tablet   Commonly known as: DELTASONE   Take 3 tablets (60MG ) daily x3 days, then 2 tablets ( 40MG ) daily x3 days, then 1 tablet ( 20MG ) daily x3 days then stop.      prochlorperazine 10 MG tablet   Commonly known as: COMPAZINE   Take 1 tablet (10 mg total) by mouth every 6 (six) hours as needed (nausea).      tiotropium 18 MCG inhalation capsule   Commonly known as: SPIRIVA   Place 1 capsule (18 mcg total) into inhaler and inhale daily.      traZODone 100 MG tablet   Commonly known as: DESYREL   Take 100 mg by mouth at bedtime.           Follow-up Information    Follow up with [facility], MD. Schedule an appointment as soon  as possible for a visit in 1 week. (f/u with PCP in 1 week)    Contact information:   [address] [place] [address] [phone]           The results of significant diagnostics from this hospitalization (including imaging, microbiology, ancillary and laboratory) are listed below for reference.    Significant Diagnostic Studies: Dg Chest 2 View  02/03/2012  *RADIOLOGY REPORT*  Clinical Data: Pain and shortness of breath  CHEST - 2 VIEW  Comparison: CT chest 05/15/2011 and chest radiograph 05/02/2011  Findings: Cardiac leads project over the chest.  The lungs are hyperinflated and there is flattening of the hemidiaphragms.  The heart and mediastinal contours are stable and within normal limits. There is no focal airspace disease, edema, or pneumothorax. Negative for pleural effusion.  Left subclavian power Port-A-Cath terminates in the mid superior vena cava.  Healed right-sided rib fractures.  No acute bony abnormality identified.  IMPRESSION: 1.  Hyperinflation of the lungs.  This could reflect COPD/emphysema or asthma.  The lungs are clear. 2.  Left subclavian Port-A-Cath in place.   Original Report Authenticated By: Britta Mccreedy, M.D.     Microbiology: Recent Results (from the past 240 hour(s))  URINE CULTURE     Status:  Normal   Collection Time   02/03/12  1:04 PM      Component Value Range Status Comment   Specimen Description URINE, RANDOM   Final    Special Requests NONE   Final    Culture  Setup Time 02/03/2012 22:25   Final    Colony Count NO GROWTH   Final    Culture NO GROWTH   Final    Report Status 02/04/2012 FINAL   Final      Labs: Basic Metabolic Panel:  Lab 02/06/12 2952 02/05/12 0445 02/04/12 0445 02/03/12 1251  NA 140 139 136 131*  K 3.6 4.6 4.1 4.3  CL 103 104 100 93*  CO2 29 28 28 30   GLUCOSE 150* 129* 114* 202*  BUN 16 18 16 15   CREATININE 0.90 0.91 0.95 1.01  CALCIUM 8.4 8.3* 8.7 9.1  MG -- -- -- --  PHOS -- -- -- --   Liver Function Tests:  Lab 02/04/12 0445 02/03/12 1251  AST 30 32  ALT 20 23  ALKPHOS 64 81  BILITOT 0.2* 0.3  PROT 6.4 7.2  ALBUMIN 3.4* 4.0   No results found for this basename: LIPASE:5,AMYLASE:5 in the last 168 hours No results found for this basename: AMMONIA:5 in the last 168 hours CBC:  Lab 02/06/12 0429 02/04/12 0445 02/03/12 1251  WBC 8.2 10.9* 9.1  NEUTROABS -- -- 7.6  HGB 13.6 14.1 16.2  HCT 41.3 41.4 46.6  MCV 100.0 96.7 97.5  PLT 110* 117* 126*   Cardiac Enzymes: No results found for this basename: CKTOTAL:5,CKMB:5,CKMBINDEX:5,TROPONINI:5 in the last 168 hours BNP: BNP (last 3 results) No results found for this basename: PROBNP:3 in the last 8760 hours CBG:  Lab 02/06/12 0729 02/05/12 0732 02/04/12 0726  GLUCAP 165* 164* 159*       Signed:  Aleecia Tapia  [facility] 02/06/2012, 10:18 AM

## 2012-03-31 ENCOUNTER — Telehealth: Payer: Self-pay | Admitting: Oncology

## 2012-03-31 ENCOUNTER — Ambulatory Visit (HOSPITAL_BASED_OUTPATIENT_CLINIC_OR_DEPARTMENT_OTHER): Payer: Medicaid Other | Admitting: Oncology

## 2012-03-31 ENCOUNTER — Ambulatory Visit: Payer: Medicaid Other

## 2012-03-31 ENCOUNTER — Other Ambulatory Visit (HOSPITAL_BASED_OUTPATIENT_CLINIC_OR_DEPARTMENT_OTHER): Payer: Medicaid Other | Admitting: Lab

## 2012-03-31 VITALS — BP 130/87 | HR 66 | Temp 98.0°F | Resp 20 | Ht 71.0 in | Wt 194.0 lb

## 2012-03-31 DIAGNOSIS — C19 Malignant neoplasm of rectosigmoid junction: Secondary | ICD-10-CM

## 2012-03-31 MED ORDER — HEPARIN SOD (PORK) LOCK FLUSH 100 UNIT/ML IV SOLN
500.0000 [IU] | Freq: Once | INTRAVENOUS | Status: AC
  Administered 2012-03-31: 500 [IU] via INTRAVENOUS
  Filled 2012-03-31: qty 5

## 2012-03-31 MED ORDER — SODIUM CHLORIDE 0.9 % IJ SOLN
10.0000 mL | INTRAMUSCULAR | Status: DC | PRN
  Administered 2012-03-31: 10 mL via INTRAVENOUS
  Filled 2012-03-31: qty 10

## 2012-03-31 MED ORDER — HYDROCODONE-ACETAMINOPHEN 5-325 MG PO TABS: 1.0000 | ORAL_TABLET | Freq: Every day | ORAL | Status: DC | PRN

## 2012-03-31 NOTE — Progress Notes (Signed)
Patient in for PAC flush. Dark brown blood noted with blood return check. Flushes well, unable to draw bright red blood. Notified Dr Truett Perna and staff. No need to instill alteplase at this time. Patient made aware of plan.

## 2012-03-31 NOTE — Progress Notes (Signed)
   [facility]    OFFICE PROGRESS NOTE   INTERVAL HISTORY:   He returns as scheduled. Taylor [name] continues to have pain associated with the right inguinal hernia. He now has a primary physician at "urg[facility] care "and reports being referred to urology. He is not sure why he was referred to a urologist as opposed to Dr. Gerrit Friends. He continues to have "tingling" in the fingers. Taylor [name] requests pain medication for the hernia.He was admitted in January with a COPD flare.  Objective:  Vital signs in last 24 hours:  Blood pressure 130/87, pulse 66, temperature 98 F (36.7 C), temperature source Oral, resp. rate 20, height 5\' 11"  (1.803 m), weight 194 lb (87.998 kg).    HEENT: neck without mass Lymphatics: no cervical, supraclavicular, axillary, or inguinal nodes Resp: lungs clear with distant breath sounds at the right greater than left chest, no respiratory distress Cardio: regular rate and rhythm GI: no hepatosplenomegaly, nontender, no mass, reducible right inguinal hernia Vascular: no leg edema  Portacath/PICC-without erythema  Lab Results: 02/03/2012-platelets 126,000 03/31/2012-CEA 2.3  Medications: I have reviewed the patient's current medications.  Assessment/Plan: 1. 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 Adjuvant FOLFOX chemotherapy initiated 05/21/2011. Oxaliplatin was held with cycle 8 due to thrombocytopenia. He completed cycle 12 on 10/22/2011. 2. CT scan chest 05/15/2011 with no evidence of metastatic disease in the chest. Mild centrilobular and paraseptal emphysematous changes. 3. Normal preoperative CEA (2.4 on 04/10/2011). CEA normal at 3.1 on 10/22/2011. 4. History of bipolar disease. 5. Right inguinal hernia. He plans to have surgical repair when approved by his insurance 6. Mild thrombocytopenia secondary to chemotherapy. Improved  .  Disposition:  He remains in clinical remission from colon cancer. He will be referred to Dr. Ewing Schlein for a surveillance colonoscopy. He will be scheduled for surveillance CT scans within the next one month.  Taylor [name] needs to see Dr. Gerrit Friends for removal of the Port-A-Cath and repair of a right inguinal hernia. He continues to have difficulty obtaining a referral from the [facility] insurance plan.  The Port-A-Cath was flushed today. He will return for an office visit and Port-A-Cath flush on 05/12/2012. We refilled the hydrocodone today.   [name], MD  03/31/2012  8:29 PM

## 2012-04-01 ENCOUNTER — Telehealth (INDEPENDENT_AMBULATORY_CARE_PROVIDER_SITE_OTHER): Payer: Self-pay

## 2012-04-01 NOTE — Telephone Encounter (Signed)
Pts mother notified we received the npi # from Dr Wyline Copas and have made appt with Dr Gerrit Friends for 04-29-12 arrive at 10:45am.

## 2012-04-15 ENCOUNTER — Encounter (HOSPITAL_COMMUNITY): Payer: Self-pay

## 2012-04-15 ENCOUNTER — Ambulatory Visit (HOSPITAL_COMMUNITY)
Admission: RE | Admit: 2012-04-15 | Discharge: 2012-04-15 | Disposition: A | Discharge status: Home / Self Care | Payer: Medicaid Other | Source: Ambulatory Visit | Attending: Oncology | Admitting: Oncology

## 2012-04-15 DIAGNOSIS — J438 Other emphysema: Secondary | ICD-10-CM | POA: Insufficient documentation

## 2012-04-15 DIAGNOSIS — K5730 Diverticulosis of large intestine without perforation or abscess without bleeding: Secondary | ICD-10-CM | POA: Insufficient documentation

## 2012-04-15 DIAGNOSIS — N289 Disorder of kidney and ureter, unspecified: Secondary | ICD-10-CM | POA: Insufficient documentation

## 2012-04-15 DIAGNOSIS — C189 Malignant neoplasm of colon, unspecified: Secondary | ICD-10-CM | POA: Insufficient documentation

## 2012-04-15 MED ORDER — IOHEXOL 300 MG/ML  SOLN
100.0000 mL | Freq: Once | INTRAMUSCULAR | Status: AC | PRN
  Administered 2012-04-15: 100 mL via INTRAVENOUS

## 2012-04-16 ENCOUNTER — Telehealth: Payer: Self-pay | Admitting: *Deleted

## 2012-04-16 NOTE — Telephone Encounter (Signed)
Called pt with CT results. He is scheduled to see Dr. Gerrit Friends in April.

## 2012-04-16 NOTE — Telephone Encounter (Signed)
Message copied by Caleb Popp on Thu Apr 16, 2012  5:35 PM ------      Message from: [name] B      Created: Wed Apr 15, 2012  7:44 PM       Please call patient, CT is negative, f/u with Dr. Gerrit Friends for portacath removal ------

## 2012-04-29 ENCOUNTER — Encounter (INDEPENDENT_AMBULATORY_CARE_PROVIDER_SITE_OTHER): Payer: Self-pay | Admitting: Surgery

## 2012-04-29 ENCOUNTER — Ambulatory Visit (INDEPENDENT_AMBULATORY_CARE_PROVIDER_SITE_OTHER): Payer: Medicaid Other | Admitting: Surgery

## 2012-04-29 VITALS — BP 114/80 | HR 87 | Temp 97.4°F | Ht 71.0 in | Wt 190.8 lb

## 2012-04-29 DIAGNOSIS — K4020 Bilateral inguinal hernia, without obstruction or gangrene, not specified as recurrent: Secondary | ICD-10-CM

## 2012-04-29 NOTE — Patient Instructions (Signed)
[facility]  HERNIA REPAIR POST OP INSTRUCTIONS  Always review your discharge instruction sheet given to you by the facility where your surgery was performed.  1. A  prescription for pain medication may be given to you upon discharge.  Take your pain medication as prescribed.  If narcotic pain medicine is not needed, then you may take acetaminophen (Tylenol) or ibuprofen (Advil) as needed.  2. Take your usually prescribed medications unless otherwise directed.  3. If you need a refill on your pain medication, please contact your pharmacy.  They will contact our office to request authorization. Prescriptions will not be filled after 5 pm daily or on weekends.  4. You should follow a light diet the first 24 hours after arrival home, such as soup and crackers or toast.  Be sure to include plenty of fluids daily.  Resume your normal diet the day after surgery.  5. Most patients will experience some swelling and bruising around the surgical site.  Ice packs and reclining will help.  Swelling and bruising can take several days to resolve.   6. It is common to experience some constipation if taking pain medication after surgery.  Increasing fluid intake and taking a stool softener (such as Colace) will usually help or prevent this problem from occurring.  A mild laxative (Milk of Magnesia or Miralax) should be taken according to package directions if there are no bowel movements after 48 hours.  7. Unless discharge instructions indicate otherwise, you may remove your bandages 24-48 hours after surgery, and you may shower at that time.  You may have steri-strips (small skin tapes) in place directly over the incision.  These strips should be left on the skin for 7-10 days.  If your surgeon used skin glue on the incision, you may shower in 24 hours.  The glue will flake off over the next 2-3 weeks.  Any sutures or staples will be removed at the office during your follow-up  visit.  8. ACTIVITIES:  You may resume regular (light) daily activities beginning the next day-such as daily self-care, walking, climbing stairs-gradually increasing activities as tolerated.  You may have sexual intercourse when it is comfortable.  Refrain from any heavy lifting or straining until approved by your doctor.  You may drive when you are no longer taking prescription pain medication, you can comfortably wear a seatbelt, and you can safely maneuver your car and apply brakes.  9. You should see your doctor in the office for a follow-up appointment approximately 2-3 weeks after your surgery.  Make sure that you call for this appointment within a day or two after you arrive home to insure a convenient appointment time. 10.   WHEN TO CALL YOUR DOCTOR: 1. Fever greater than 101.0 2. Inability to urinate 3. Persistent nausea and/or vomiting 4. Extreme swelling or bruising 5. Continued bleeding from incision 6. Increased pain, redness, or drainage from the incision  The clinic staff is available to answer your questions during regular business hours.  Please don't hesitate to call and ask to speak to one of the nurses for clinical concerns.  If you have a medical emergency, go to the nearest emergency room or call 911.  A surgeon from [facility] is always on call for the hospital.   [facility] [address], [address], [place], [place]  [address]  [phone] ? [phone] ? FAX [phone]  [email or web address]   

## 2012-04-29 NOTE — Progress Notes (Signed)
General Surgery - [facility]  Visit Diagnoses: 1. Inguinal hernia bilateral, non-recurrent     HISTORY: Patient is a 56-year-old white male who underwent low anterior resection for adenocarcinoma of the sigmoid colon. He has done well following chemotherapy. He has a long-standing right inguinal hernia which has become larger in size and remains symptomatic. He presents today to discuss surgical repair. Patient also desires concurrent removal of his left subclavian infusion port.  PERTINENT REVIEW OF SYSTEMS: Right groin discomfort, intermittent. Right inguinal hernia, always reducible. No signs or symptoms of intestinal obstruction.  EXAM: HEENT: normocephalic; pupils equal and reactive; sclerae clear; dentition good; mucous membranes moist NECK:  symmetric on extension; no palpable anterior or posterior cervical lymphadenopathy; no supraclavicular masses; no tenderness CHEST: clear to auscultation bilaterally without rales, rhonchi, or wheezes; infusion port left upper chest wall, incisions are well healed, no sign of seroma or infection. CARDIAC: regular rate and rhythm without significant murmur; peripheral pulses are full GU:  Normal male without mass or lesion. Obvious right inguinal hernia. Reducible. Mildly tender. Palpation in the left inguinal canal with cough and Valsalva shows no sign of hernia. EXT:  non-tender without edema; no deformity NEURO: no gross focal deficits; no sign of tremor   IMPRESSION: #1 right inguinal hernia, reducible, symptomatic #2 personal history of colon carcinoma, desires removal of infusion port  PLAN: I discussed with the patient the indications for right inguinal hernia repair with mesh. We discussed the procedure. We discussed the potential for recurrence. We discussed restrictions on his activities after the procedure. He understands and wishes to proceed. He also desires concurrent removal of his infusion port. We will make  arrangements for these procedures as an out-patient surgery in the near future.  The risks and benefits of the procedure have been discussed at length with the patient.  The patient understands the proposed procedure, potential alternative treatments, and the course of recovery to be expected.  All of the patient's questions have been answered at this time.  The patient wishes to proceed with surgery.  Tavaras Goody M. Graylyn Bunney, MD, FACS [facility] Office: [phone]    

## 2012-04-30 ENCOUNTER — Encounter (HOSPITAL_COMMUNITY)
Admission: RE | Admit: 2012-04-30 | Discharge: 2012-04-30 | Disposition: A | Discharge status: Home / Self Care | Payer: Medicaid Other | Source: Ambulatory Visit | Attending: Surgery | Admitting: Surgery

## 2012-04-30 ENCOUNTER — Encounter (HOSPITAL_COMMUNITY): Payer: Self-pay | Admitting: Pharmacy Technician

## 2012-04-30 ENCOUNTER — Encounter (HOSPITAL_COMMUNITY): Payer: Self-pay

## 2012-04-30 HISTORY — DX: Chronic obstructive pulmonary disease, unspecified: J44.9

## 2012-04-30 LAB — BASIC METABOLIC PANEL
BUN: 13 mg/dL (ref 6–23)
CO2: 33 mEq/L — ABNORMAL HIGH (ref 19–32)
Calcium: 8.8 mg/dL (ref 8.4–10.5)
GFR calc non Af Amer: 90 mL/min — ABNORMAL LOW (ref >90)
Glucose, Bld: 126 mg/dL — ABNORMAL HIGH (ref 70–99)
Potassium: 4.3 mEq/L (ref 3.5–5.1)

## 2012-04-30 LAB — CBC
HCT: 48.2 % (ref 39.0–52.0)
Hemoglobin: 16.5 g/dL (ref 13.0–17.0)
MCH: 32.5 pg (ref 26.0–34.0)
MCHC: 34.2 g/dL (ref 30.0–36.0)

## 2012-04-30 NOTE — Progress Notes (Signed)
Quick Note:  These results are acceptable for scheduled surgery.  Nikash Mortensen M. Kaisy Severino, MD, FACS [facility] Office: [phone]   ______ 

## 2012-04-30 NOTE — Pre-Procedure Instructions (Signed)
EKG WAS DONE TODAY AT [facility] PREOP PT HAS CHEST CT REPORT IN EPIC FROM 04/15/12.

## 2012-04-30 NOTE — Patient Instructions (Signed)
YOUR SURGERY IS SCHEDULED AT [facility]  ON:  Friday  4/11  REPORT TO [facility] AT:  7:30 AM      PHONE # FOR SHORT STAY IS [phone]  DO NOT EAT OR DRINK ANYTHING AFTER MIDNIGHT THE NIGHT BEFORE YOUR SURGERY.  YOU MAY BRUSH YOUR TEETH, RINSE OUT YOUR MOUTH--BUT NO WATER, NO FOOD, NO CHEWING GUM, NO MINTS, NO CANDIES, NO CHEWING TOBACCO.  PLEASE TAKE THE FOLLOWING MEDICATIONS THE AM OF YOUR SURGERY WITH A FEW SIPS OF WATER:  ABILIFY, CLONAZEPAM, ESCITALOPRAM, AND MAY TAKE HYDROCODONE / ACETAMINOPHEN IF NEEDED FOR PAIN    DO NOT BRING VALUABLES, MONEY, CREDIT CARDS.  DO NOT WEAR JEWELRY, MAKE-UP, NAIL POLISH AND NO METAL PINS OR CLIPS IN YOUR HAIR. CONTACT LENS, DENTURES / PARTIALS, GLASSES SHOULD NOT BE WORN TO SURGERY AND IN MOST CASES-HEARING AIDS WILL NEED TO BE REMOVED.  BRING YOUR GLASSES CASE, ANY EQUIPMENT NEEDED FOR YOUR CONTACT LENS. FOR PATIENTS ADMITTED TO THE HOSPITAL--CHECK OUT TIME THE DAY OF DISCHARGE IS 11:00 AM.  ALL INPATIENT ROOMS ARE PRIVATE - WITH BATHROOM, TELEPHONE, TELEVISION AND WIFI INTERNET.  IF YOU ARE BEING DISCHARGED THE SAME DAY OF YOUR SURGERY--YOU CAN NOT DRIVE YOURSELF HOME--AND SHOULD NOT GO HOME ALONE BY TAXI OR BUS.  NO DRIVING OR OPERATING MACHINERY FOR 24 HOURS FOLLOWING ANESTHESIA / PAIN MEDICATIONS.  PLEASE MAKE ARRANGEMENTS FOR SOMEONE TO BE WITH YOU AT HOME THE FIRST 24 HOURS AFTER SURGERY. RESPONSIBLE DRIVER'S NAME  CLYDE PERDUE - PT'S BROTHER IN LAW                                               PHONE #   PT WILL BRING PHONE NUMBER                             PLEASE READ OVER ANY  FACT SHEETS THAT YOU WERE GIVEN: MRSA INFORMATION, BLOOD TRANSFUSION INFORMATION, INCENTIVE SPIROMETER INFORMATION. FAILURE TO FOLLOW THESE INSTRUCTIONS MAY RESULT IN THE CANCELLATION OF YOUR SURGERY.   PATIENT SIGNATURE_________________________________

## 2012-05-01 ENCOUNTER — Ambulatory Visit (HOSPITAL_COMMUNITY)
Admission: RE | Admit: 2012-05-01 | Discharge: 2012-05-01 | Disposition: A | Discharge status: Home / Self Care | Payer: Medicaid Other | Source: Ambulatory Visit | Attending: Surgery | Admitting: Surgery

## 2012-05-01 ENCOUNTER — Encounter (HOSPITAL_COMMUNITY)
Admission: RE | Disposition: A | Discharge status: Home / Self Care | Payer: Self-pay | Source: Ambulatory Visit | Attending: Surgery

## 2012-05-01 ENCOUNTER — Encounter (HOSPITAL_COMMUNITY): Payer: Self-pay | Admitting: *Deleted

## 2012-05-01 ENCOUNTER — Ambulatory Visit (HOSPITAL_COMMUNITY): Payer: Medicaid Other | Admitting: Anesthesiology

## 2012-05-01 ENCOUNTER — Encounter (HOSPITAL_COMMUNITY): Payer: Self-pay | Admitting: Anesthesiology

## 2012-05-01 DIAGNOSIS — F329 Major depressive disorder, single episode, unspecified: Secondary | ICD-10-CM | POA: Insufficient documentation

## 2012-05-01 DIAGNOSIS — C189 Malignant neoplasm of colon, unspecified: Secondary | ICD-10-CM

## 2012-05-01 DIAGNOSIS — Z452 Encounter for adjustment and management of vascular access device: Secondary | ICD-10-CM

## 2012-05-01 DIAGNOSIS — Z01810 Encounter for preprocedural cardiovascular examination: Secondary | ICD-10-CM | POA: Insufficient documentation

## 2012-05-01 DIAGNOSIS — K4090 Unilateral inguinal hernia, without obstruction or gangrene, not specified as recurrent: Secondary | ICD-10-CM | POA: Insufficient documentation

## 2012-05-01 DIAGNOSIS — J4489 Other specified chronic obstructive pulmonary disease: Secondary | ICD-10-CM | POA: Insufficient documentation

## 2012-05-01 DIAGNOSIS — Z01812 Encounter for preprocedural laboratory examination: Secondary | ICD-10-CM | POA: Insufficient documentation

## 2012-05-01 DIAGNOSIS — J449 Chronic obstructive pulmonary disease, unspecified: Secondary | ICD-10-CM | POA: Insufficient documentation

## 2012-05-01 DIAGNOSIS — F17200 Nicotine dependence, unspecified, uncomplicated: Secondary | ICD-10-CM | POA: Insufficient documentation

## 2012-05-01 DIAGNOSIS — Z85038 Personal history of other malignant neoplasm of large intestine: Secondary | ICD-10-CM | POA: Insufficient documentation

## 2012-05-01 DIAGNOSIS — Z9221 Personal history of antineoplastic chemotherapy: Secondary | ICD-10-CM | POA: Insufficient documentation

## 2012-05-01 DIAGNOSIS — F3289 Other specified depressive episodes: Secondary | ICD-10-CM | POA: Insufficient documentation

## 2012-05-01 HISTORY — PX: INSERTION OF MESH: SHX5868

## 2012-05-01 HISTORY — PX: INGUINAL HERNIA REPAIR: SHX194

## 2012-05-01 HISTORY — PX: PORT-A-CATH REMOVAL: SHX5289

## 2012-05-01 SURGERY — REPAIR, HERNIA, INGUINAL, ADULT
Anesthesia: General | Site: Groin | Laterality: Right | Wound class: Clean

## 2012-05-01 MED ORDER — OXYCODONE-ACETAMINOPHEN 5-325 MG PO TABS
1.0000 | ORAL_TABLET | ORAL | Status: DC | PRN
  Filled 2012-05-01: qty 1

## 2012-05-01 MED ORDER — EPHEDRINE SULFATE 50 MG/ML IJ SOLN
INTRAMUSCULAR | Status: DC | PRN
  Administered 2012-05-01 (×2): 5 mg via INTRAVENOUS

## 2012-05-01 MED ORDER — CEFAZOLIN SODIUM-DEXTROSE 2-3 GM-% IV SOLR
2.0000 g | INTRAVENOUS | Status: AC
  Administered 2012-05-01: 2 g via INTRAVENOUS

## 2012-05-01 MED ORDER — CEFAZOLIN SODIUM-DEXTROSE 2-3 GM-% IV SOLR
INTRAVENOUS | Status: AC
  Filled 2012-05-01: qty 50

## 2012-05-01 MED ORDER — PROMETHAZINE HCL 25 MG/ML IJ SOLN: 6.2500 mg | INTRAMUSCULAR | Status: DC | PRN

## 2012-05-01 MED ORDER — MEPERIDINE HCL 50 MG/ML IJ SOLN: 6.2500 mg | INTRAMUSCULAR | Status: DC | PRN

## 2012-05-01 MED ORDER — BUPIVACAINE-EPINEPHRINE 0.25% -1:200000 IJ SOLN
INTRAMUSCULAR | Status: AC
  Filled 2012-05-01: qty 1

## 2012-05-01 MED ORDER — LACTATED RINGERS IV SOLN
INTRAVENOUS | Status: DC | PRN
  Administered 2012-05-01: 10:00:00 via INTRAVENOUS
  Administered 2012-05-01: 1000 mL
  Administered 2012-05-01: 08:00:00 via INTRAVENOUS

## 2012-05-01 MED ORDER — NEOSTIGMINE METHYLSULFATE 1 MG/ML IJ SOLN
INTRAMUSCULAR | Status: DC | PRN
  Administered 2012-05-01: 3 mg via INTRAVENOUS

## 2012-05-01 MED ORDER — HYDROMORPHONE HCL PF 1 MG/ML IJ SOLN
INTRAMUSCULAR | Status: AC
  Filled 2012-05-01: qty 1

## 2012-05-01 MED ORDER — BUPIVACAINE HCL (PF) 0.5 % IJ SOLN
INTRAMUSCULAR | Status: DC | PRN
  Administered 2012-05-01 (×2): 10 mL
  Administered 2012-05-01: 9 mL

## 2012-05-01 MED ORDER — ACETAMINOPHEN 10 MG/ML IV SOLN
INTRAVENOUS | Status: AC
  Filled 2012-05-01: qty 100

## 2012-05-01 MED ORDER — BUPIVACAINE HCL (PF) 0.5 % IJ SOLN
INTRAMUSCULAR | Status: AC
  Filled 2012-05-01: qty 30

## 2012-05-01 MED ORDER — ACETAMINOPHEN 10 MG/ML IV SOLN
INTRAVENOUS | Status: DC | PRN
  Administered 2012-05-01: 1000 mg via INTRAVENOUS

## 2012-05-01 MED ORDER — ONDANSETRON HCL 4 MG/2ML IJ SOLN
INTRAMUSCULAR | Status: DC | PRN
  Administered 2012-05-01: 4 mg via INTRAVENOUS

## 2012-05-01 MED ORDER — GLYCOPYRROLATE 0.2 MG/ML IJ SOLN
INTRAMUSCULAR | Status: DC | PRN
  Administered 2012-05-01: .4 mg via INTRAVENOUS

## 2012-05-01 MED ORDER — FENTANYL CITRATE 0.05 MG/ML IJ SOLN
INTRAMUSCULAR | Status: DC | PRN
  Administered 2012-05-01: 50 ug via INTRAVENOUS

## 2012-05-01 MED ORDER — MIDAZOLAM HCL 5 MG/5ML IJ SOLN
INTRAMUSCULAR | Status: DC | PRN
  Administered 2012-05-01: 2 mg via INTRAVENOUS

## 2012-05-01 MED ORDER — ACETAMINOPHEN 10 MG/ML IV SOLN: 1000.0000 mg | Freq: Once | INTRAVENOUS | Status: DC | PRN

## 2012-05-01 MED ORDER — ROCURONIUM BROMIDE 100 MG/10ML IV SOLN
INTRAVENOUS | Status: DC | PRN
  Administered 2012-05-01 (×3): 50 mg via INTRAVENOUS

## 2012-05-01 MED ORDER — PHENYLEPHRINE HCL 10 MG/ML IJ SOLN
INTRAMUSCULAR | Status: DC | PRN
  Administered 2012-05-01 (×2): 40 ug via INTRAVENOUS

## 2012-05-01 MED ORDER — LIDOCAINE HCL (CARDIAC) 20 MG/ML IV SOLN
INTRAVENOUS | Status: DC | PRN
  Administered 2012-05-01: 100 mg via INTRAVENOUS

## 2012-05-01 MED ORDER — 0.9 % SODIUM CHLORIDE (POUR BTL) OPTIME
TOPICAL | Status: DC | PRN
  Administered 2012-05-01: 1000 mL

## 2012-05-01 MED ORDER — HYDROMORPHONE HCL PF 1 MG/ML IJ SOLN
0.2500 mg | INTRAMUSCULAR | Status: DC | PRN
  Administered 2012-05-01 (×2): 0.5 mg via INTRAVENOUS

## 2012-05-01 MED ORDER — PROPOFOL 10 MG/ML IV BOLUS
INTRAVENOUS | Status: DC | PRN
  Administered 2012-05-01: 150 mg via INTRAVENOUS

## 2012-05-01 SURGICAL SUPPLY — 40 items
APPLICATOR COTTON TIP 6IN STRL (MISCELLANEOUS) ×3 IMPLANT
BENZOIN TINCTURE PRP APPL 2/3 (GAUZE/BANDAGES/DRESSINGS) ×3 IMPLANT
BLADE HEX COATED 2.75 (ELECTRODE) ×3 IMPLANT
BLADE SURG 15 STRL LF DISP TIS (BLADE) ×2 IMPLANT
BLADE SURG 15 STRL SS (BLADE) ×1
BLADE SURG SZ10 CARB STEEL (BLADE) IMPLANT
CANISTER SUCTION 2500CC (MISCELLANEOUS) ×3 IMPLANT
CLOTH BEACON ORANGE TIMEOUT ST (SAFETY) ×3 IMPLANT
DECANTER SPIKE VIAL GLASS SM (MISCELLANEOUS) ×3 IMPLANT
DRAIN PENROSE 18X1/2 LTX STRL (DRAIN) ×3 IMPLANT
DRAPE LAPAROTOMY TRNSV 102X78 (DRAPE) ×3 IMPLANT
ELECT REM PT RETURN 9FT ADLT (ELECTROSURGICAL) ×3
ELECTRODE REM PT RTRN 9FT ADLT (ELECTROSURGICAL) ×2 IMPLANT
GAUZE SPONGE 4X4 16PLY XRAY LF (GAUZE/BANDAGES/DRESSINGS) ×3 IMPLANT
GLOVE BIOGEL PI IND STRL 7.0 (GLOVE) ×2 IMPLANT
GLOVE BIOGEL PI INDICATOR 7.0 (GLOVE) ×1
GLOVE SURG ORTHO 8.0 STRL STRW (GLOVE) ×3 IMPLANT
GOWN STRL NON-REIN LRG LVL3 (GOWN DISPOSABLE) ×3 IMPLANT
GOWN STRL REIN XL XLG (GOWN DISPOSABLE) ×9 IMPLANT
KIT BASIN OR (CUSTOM PROCEDURE TRAY) ×3 IMPLANT
MESH ULTRAPRO 3X6 7.6X15CM (Mesh General) ×3 IMPLANT
NEEDLE HYPO 25X1 1.5 SAFETY (NEEDLE) ×3 IMPLANT
NS IRRIG 1000ML POUR BTL (IV SOLUTION) ×3 IMPLANT
PACK BASIC VI WITH GOWN DISP (CUSTOM PROCEDURE TRAY) ×3 IMPLANT
PENCIL BUTTON HOLSTER BLD 10FT (ELECTRODE) ×3 IMPLANT
SPONGE GAUZE 4X4 12PLY (GAUZE/BANDAGES/DRESSINGS) ×6 IMPLANT
SPONGE LAP 4X18 X RAY DECT (DISPOSABLE) ×6 IMPLANT
STRIP CLOSURE SKIN 1/2X4 (GAUZE/BANDAGES/DRESSINGS) ×6 IMPLANT
SUT MNCRL AB 4-0 PS2 18 (SUTURE) ×6 IMPLANT
SUT NOVA NAB GS-21 1 T12 (SUTURE) ×6 IMPLANT
SUT NOVA NAB GS-22 2 0 T19 (SUTURE) ×6 IMPLANT
SUT SILK 2 0 SH (SUTURE) ×6 IMPLANT
SUT VIC AB 3-0 SH 18 (SUTURE) ×3 IMPLANT
SUT VIC AB 3-0 SH 27 (SUTURE) ×1
SUT VIC AB 3-0 SH 27XBRD (SUTURE) ×2 IMPLANT
SYR BULB IRRIGATION 50ML (SYRINGE) ×3 IMPLANT
SYR CONTROL 10ML LL (SYRINGE) ×3 IMPLANT
TAPE CLOTH SURG 4X10 WHT LF (GAUZE/BANDAGES/DRESSINGS) ×3 IMPLANT
TOWEL OR 17X26 10 PK STRL BLUE (TOWEL DISPOSABLE) ×9 IMPLANT
YANKAUER SUCT BULB TIP 10FT TU (MISCELLANEOUS) ×3 IMPLANT

## 2012-05-01 NOTE — Anesthesia Preprocedure Evaluation (Addendum)
Anesthesia Evaluation  Patient identified by MRN, date of birth, ID band Patient awake    Reviewed: Allergy & Precautions, H&P , NPO status , Patient's Chart, lab work & pertinent test results  Airway Mallampati: II TM Distance: >3 FB Neck ROM: Full    Dental no notable dental hx. (+) Dental Advisory Given   Pulmonary shortness of breath and with exertion, COPDCurrent Smoker,  breath sounds clear to auscultation  Pulmonary exam normal       Cardiovascular negative cardio ROS  Rhythm:Regular Rate:Normal     Neuro/Psych Depression negative neurological ROS     GI/Hepatic negative GI ROS, Neg liver ROS,   Endo/Other  negative endocrine ROS  Renal/GU negative Renal ROS  negative genitourinary   Musculoskeletal negative musculoskeletal ROS (+)   Abdominal   Peds negative pediatric ROS (+)  Hematology negative hematology ROS (+)   Anesthesia Other Findings   Reproductive/Obstetrics negative OB ROS                           Anesthesia Physical  Anesthesia Plan  ASA: II  Anesthesia Plan: General   Post-op Pain Management:    Induction: Intravenous  Airway Management Planned: Oral ETT  Additional Equipment:   Intra-op Plan:   Post-operative Plan: Extubation in OR  Informed Consent: I have reviewed the patients History and Physical, chart, labs and discussed the procedure including the risks, benefits and alternatives for the proposed anesthesia with the patient or authorized representative who has indicated his/her understanding and acceptance.   Dental advisory given  Plan Discussed with: CRNA  Anesthesia Plan Comments:         Anesthesia Quick Evaluation                                  Anesthesia Evaluation  Patient identified by MRN, date of birth, ID band Patient awake    Reviewed: Allergy & Precautions, H&P , NPO status , Patient's Chart, lab work & pertinent test  results  History of Anesthesia Complications Negative for: history of anesthetic complications  Airway Mallampati: II TM Distance: >3 FB Neck ROM: Full    Dental  (+) Partial Lower and Partial Upper   Pulmonary Current Smoker,  breath sounds clear to auscultation        Cardiovascular negative cardio ROS  Rhythm:Regular Rate:Normal     Neuro/Psych PSYCHIATRIC DISORDERS Depression negative neurological ROS     GI/Hepatic Neg liver ROS, Neoplasm colon   Endo/Other  negative endocrine ROS  Renal/GU negative Renal ROS  negative genitourinary   Musculoskeletal negative musculoskeletal ROS (+)   Abdominal   Peds  Hematology negative hematology ROS (+)   Anesthesia Other Findings   Reproductive/Obstetrics negative OB ROS                          Anesthesia Physical Anesthesia Plan  ASA: III  Anesthesia Plan: MAC   Post-op Pain Management:    Induction: Intravenous  Airway Management Planned: Simple Face Mask  Additional Equipment:   Intra-op Plan:   Post-operative Plan:   Informed Consent: I have reviewed the patients History and Physical, chart, labs and discussed the procedure including the risks, benefits and alternatives for the proposed anesthesia with the patient or authorized representative who has indicated his/her understanding and acceptance.   Dental advisory given  Plan Discussed with: CRNA  Anesthesia Plan Comments:         Anesthesia Quick Evaluation                                  Anesthesia Evaluation  Patient identified by MRN, date of birth, ID band Patient awake    Reviewed: Allergy & Precautions, H&P , NPO status , Patient's Chart, lab work & pertinent test results  Airway Mallampati: II TM Distance: >3 FB Neck ROM: Full    Dental No notable dental hx.    Pulmonary Current Smoker,  breath sounds clear to auscultation  Pulmonary exam normal        Cardiovascular negative cardio ROS  Rhythm:Regular Rate:Normal     Neuro/Psych Depression negative neurological ROS     GI/Hepatic negative GI ROS, Neg liver ROS,   Endo/Other  negative endocrine ROS  Renal/GU negative Renal ROS  negative genitourinary   Musculoskeletal negative musculoskeletal ROS (+)   Abdominal   Peds negative pediatric ROS (+)  Hematology negative hematology ROS (+)   Anesthesia Other Findings   Reproductive/Obstetrics negative OB ROS                           Anesthesia Physical Anesthesia Plan  ASA: II  Anesthesia Plan: General   Post-op Pain Management:    Induction: Intravenous  Airway Management Planned: Oral ETT  Additional Equipment:   Intra-op Plan:   Post-operative Plan: Extubation in OR  Informed Consent: I have reviewed the patients History and Physical, chart, labs and discussed the procedure including the risks, benefits and alternatives for the proposed anesthesia with the patient or authorized representative who has indicated his/her understanding and acceptance.   Dental advisory given  Plan Discussed with: CRNA  Anesthesia Plan Comments:         Anesthesia Quick Evaluation                                    Anesthesia Evaluation  Patient identified by MRN, date of birth, ID band Patient awake    Reviewed: Allergy & Precautions, H&P , NPO status , Patient's Chart, lab work & pertinent test results  Airway Mallampati: II TM Distance: >3 FB Neck ROM: Full    Dental No notable dental hx.    Pulmonary Current Smoker,  breath sounds clear to auscultation  Pulmonary exam normal       Cardiovascular negative cardio ROS  Rhythm:Regular Rate:Normal     Neuro/Psych Depression negative neurological ROS     GI/Hepatic negative GI ROS, Neg liver ROS,   Endo/Other  negative endocrine ROS  Renal/GU negative Renal ROS  negative genitourinary    Musculoskeletal negative musculoskeletal ROS (+)   Abdominal   Peds negative pediatric ROS (+)  Hematology negative hematology ROS (+)   Anesthesia Other Findings   Reproductive/Obstetrics negative OB ROS                           Anesthesia Physical Anesthesia Plan  ASA: II  Anesthesia Plan: General   Post-op Pain Management:    Induction: Intravenous  Airway Management Planned: Oral ETT  Additional Equipment:   Intra-op Plan:   Post-operative Plan: Extubation in OR  Informed Consent: I have reviewed the patients History and Physical, chart, labs and discussed  the procedure including the risks, benefits and alternatives for the proposed anesthesia with the patient or authorized representative who has indicated his/her understanding and acceptance.   Dental advisory given  Plan Discussed with: CRNA  Anesthesia Plan Comments:         Anesthesia Quick Evaluation

## 2012-05-01 NOTE — Interval H&P Note (Signed)
History and Physical Interval Note:  05/01/2012 7:39 AM  Taylor [name]  has presented today for surgery, with the diagnosis of RIH, reducible; infusion port left subclavian infusion port.  The various methods of treatment have been discussed with the patient and family. After consideration of risks, benefits and other options for treatment, the patient has consented to    Procedure(s): HERNIA REPAIR INGUINAL ADULT (Right) INSERTION OF MESH (Right) REMOVAL Infusion Port (N/A) as a surgical intervention .    The patient's history has been reviewed, patient examined, no change in status, stable for surgery.  I have reviewed the patient's chart and labs.  Questions were answered to the patient's satisfaction.    Velora Heckler, MD, [facility] Office: [phone]    Tanvi Gatling Judie Petit

## 2012-05-01 NOTE — Op Note (Signed)
Inguinal Hernia, Open, Procedure Note  Pre-operative Diagnosis:  Right inguinal hernia, hx of colon cancer  Post-operative Diagnosis: same  Surgeon:  Velora Heckler, MD, FACS  Procedure:  1. Right inguinal hernia repair with mesh  2. Removal of infusion port  Anesthesia:  General  Indications: The patient presented with a right, reducible hernia.  Patient desires concurrent removal of infusion port.  Procedure Details  The patient was seen again in the Holding Room. The risks, benefits, complications, treatment options, and expected outcomes were discussed with the patient.  There was concurrence with the proposed plan, and informed consent was obtained. The site of surgery was properly noted/marked. The patient was taken to the Operating Room, identified by name, and the procedure verified as hernia repair. A Time Out was held and the above information confirmed.  The patient was placed in the supine position and underwent induction of anesthesia.  The lower abdomen and groin was prepped and draped in the usual strict aseptic fashion.  After ascertaining that an adequate level of anesthesia had been obtained, and incision is made in the groin with a #10 blade.  Dissection is carried through the subcutaneous tissues and hemostasis obtained with the electrocautery.  A Gelpi retractor is placed for exposure.  The external oblique fascia is incised in line with it's fibers and extended through the external inguinal ring.  The cord structures are dissected out of the inguinal canal and encircled with a Penrose drain.  The floor of the inguinal canal is dissected out.  The cord is explored and no evidence of an indirect hernia is identified.  There is a moderate sized direct inguinal hernia.  The sac is dissected out and reduced.  It is held in reduction with interrupted 2-0 silk sutures.  The floor of the inguinal canal is reconstructed with a sheet of mesh cut to the appropriate dimensions.  It is  secured to the pubic tubercle with a 2-0 Novafil suture and along the inguinal ligament with a running 2-0 Novafil suture.  Mesh is split to accommodate the cord structures.  The superior edge of the mesh is secured to the transversalis and internal oblique muscles with interrupted 2-0 Novafil sutures.  The tails of the mesh are overlapped lateral to the cord structures and secured to the inguinal ligament with interrupted 2-0 Novafil sutures to recreate the internal inguinal ring.  Cord structures are returned to the inguinal canal.  Local anesthetic is infiltrated throughout the field.  External oblique fascia is closed with interrupted 3-0 Vicryl sutures.  Subcutaneous tissues are closed with interrupted 3-0 Vicryl sutures.  Skin is anesthetized with local anesthetic, and the skin edges re-approximated with a running 4-0 Monocryl suture.  Wound is washed and dried and benzoin and steristrips are applied.  A gauze dressing is then applied.  Next the left subclavian area of the chest wall is prepped and draped.  Incision is made through the old scar at the site of port placement.  The port is mobilized and sutures securing it to the chest wall are removed.  Port and catheter are removed in their entirety.  Catheter tract is sutured closed with a 3-0 vicryl suture.  Subcutaneous tissues are closed with interrupted 3-0 vicryl sutures.  Skin is closed with a running 4-0 monocryl suture.  Wound is washed and dried and steri-strips are applied.  A gauze dressing is then applied.  Patient is awakened from anesthesia and taken to the recovery room.  Instrument, sponge, and needle  counts were correct prior to closure and at the conclusion of the case.  Velora Heckler, MD, FACS General & Endocrine Surgery [facility]   Findings: Hernia as above  Estimated Blood Loss: Minimal         Specimens: None  Complications: None; patient tolerated the procedure well.         Disposition:  PACU - hemodynamically stable.         Condition: stable  Velora Heckler, MD, [facility] Office: [phone]

## 2012-05-01 NOTE — Transfer of Care (Signed)
Immediate Anesthesia Transfer of Care Note  Patient: Taylor [name]  Procedure(s) Performed: Procedure(s): HERNIA REPAIR INGUINAL ADULT (Right) INSERTION OF MESH (Right) REMOVAL Infusion Port (Left)  Patient Location: PACU  Anesthesia Type:General  Level of Consciousness: awake, alert  and oriented  Airway & Oxygen Therapy: Patient Spontanous Breathing and Patient connected to face mask oxygen  Post-op Assessment: Report given to PACU RN, Post -op Vital signs reviewed and stable and Patient moving all extremities  Post vital signs: Reviewed and stable  Complications: No apparent anesthesia complications

## 2012-05-01 NOTE — H&P (View-Only) (Signed)
General Surgery [facility]  Visit Diagnoses: 1. Inguinal hernia bilateral, non-recurrent     HISTORY: Patient is a 57 year old white male who underwent low anterior resection for adenocarcinoma of the sigmoid colon. He has done well following chemotherapy. He has a long-standing right inguinal hernia which has become larger in size and remains symptomatic. He presents today to discuss surgical repair. Patient also desires concurrent removal of his left subclavian infusion port.  PERTINENT REVIEW OF SYSTEMS: Right groin discomfort, intermittent. Right inguinal hernia, always reducible. No signs or symptoms of intestinal obstruction.  EXAM: HEENT: normocephalic; pupils equal and reactive; sclerae clear; dentition good; mucous membranes moist NECK:  symmetric on extension; no palpable anterior or posterior cervical lymphadenopathy; no supraclavicular masses; no tenderness CHEST: clear to auscultation bilaterally without rales, rhonchi, or wheezes; infusion port left upper chest wall, incisions are well healed, no sign of seroma or infection. CARDIAC: regular rate and rhythm without significant murmur; peripheral pulses are full GU:  Normal male without mass or lesion. Obvious right inguinal hernia. Reducible. Mildly tender. Palpation in the left inguinal canal with cough and Valsalva shows no sign of hernia. EXT:  non-tender without edema; no deformity NEURO: no gross focal deficits; no sign of tremor   IMPRESSION: #1 right inguinal hernia, reducible, symptomatic #2 personal history of colon carcinoma, desires removal of infusion port  PLAN: I discussed with the patient the indications for right inguinal hernia repair with mesh. We discussed the procedure. We discussed the potential for recurrence. We discussed restrictions on his activities after the procedure. He understands and wishes to proceed. He also desires concurrent removal of his infusion port. We will make  arrangements for these procedures as an out-patient surgery in the near future.  The risks and benefits of the procedure have been discussed at length with the patient.  The patient understands the proposed procedure, potential alternative treatments, and the course of recovery to be expected.  All of the patient's questions have been answered at this time.  The patient wishes to proceed with surgery.  Velora Heckler, MD, [facility] Office: [phone]

## 2012-05-01 NOTE — Preoperative (Signed)
Beta Blockers   Reason not to administer Beta Blockers:Not Applicable 

## 2012-05-01 NOTE — Anesthesia Postprocedure Evaluation (Signed)
Anesthesia Post Note  Patient: Taylor [name]  Procedure(s) Performed: Procedure(s) (LRB): HERNIA REPAIR INGUINAL ADULT (Right) INSERTION OF MESH (Right) REMOVAL Infusion Port (Left)  Anesthesia type: General  Patient location: PACU  Post pain: Pain level controlled  Post assessment: Post-op Vital signs reviewed  Last Vitals: BP 103/75  Pulse 55  Temp(Src) 36.4 C (Oral)  Resp 16  SpO2 92%  Post vital signs: Reviewed  Level of consciousness: sedated  Complications: No apparent anesthesia complications

## 2012-05-04 ENCOUNTER — Encounter (HOSPITAL_COMMUNITY): Payer: Self-pay | Admitting: Surgery

## 2012-05-07 ENCOUNTER — Telehealth (INDEPENDENT_AMBULATORY_CARE_PROVIDER_SITE_OTHER): Payer: Self-pay

## 2012-05-07 NOTE — Telephone Encounter (Signed)
Spoke with pts Mother. Pt home doing well. PO appt made.

## 2012-05-12 ENCOUNTER — Other Ambulatory Visit (HOSPITAL_BASED_OUTPATIENT_CLINIC_OR_DEPARTMENT_OTHER): Payer: Medicaid Other

## 2012-05-12 ENCOUNTER — Ambulatory Visit (HOSPITAL_BASED_OUTPATIENT_CLINIC_OR_DEPARTMENT_OTHER): Payer: Medicaid Other | Admitting: Nurse Practitioner

## 2012-05-12 ENCOUNTER — Telehealth: Payer: Self-pay | Admitting: Oncology

## 2012-05-12 ENCOUNTER — Other Ambulatory Visit (INDEPENDENT_AMBULATORY_CARE_PROVIDER_SITE_OTHER): Payer: Self-pay

## 2012-05-12 VITALS — BP 126/77 | HR 69 | Temp 97.5°F | Resp 20 | Ht 71.0 in | Wt 190.2 lb

## 2012-05-12 DIAGNOSIS — G8918 Other acute postprocedural pain: Secondary | ICD-10-CM

## 2012-05-12 DIAGNOSIS — C19 Malignant neoplasm of rectosigmoid junction: Secondary | ICD-10-CM

## 2012-05-12 DIAGNOSIS — C189 Malignant neoplasm of colon, unspecified: Secondary | ICD-10-CM

## 2012-05-12 DIAGNOSIS — K4090 Unilateral inguinal hernia, without obstruction or gangrene, not specified as recurrent: Secondary | ICD-10-CM

## 2012-05-12 LAB — BASIC METABOLIC PANEL (CC13)
CO2: 27 mEq/L (ref 22–29)
Calcium: 9 mg/dL (ref 8.4–10.4)
Glucose: 107 mg/dl — ABNORMAL HIGH (ref 70–99)
Potassium: 4.6 mEq/L (ref 3.5–5.1)
Sodium: 136 mEq/L (ref 136–145)

## 2012-05-12 MED ORDER — HYDROCODONE-ACETAMINOPHEN 5-325 MG PO TABS: 1.0000 | ORAL_TABLET | Freq: Four times a day (QID) | ORAL | Status: DC | PRN

## 2012-05-12 NOTE — Telephone Encounter (Signed)
gv and printed pt appt sched and avs for OCT...s/w Carollee Herter @ Dr. Marlane Hatcher office and she stated that the pt will be contacted with nxt appt...pt had last colonoscopy 3.13.13

## 2012-05-12 NOTE — Progress Notes (Signed)
OFFICE PROGRESS NOTE  Interval history:  Taylor [name] returns as scheduled. Restaging CT evaluation 04/15/2012 showed no evidence of metastatic disease. He underwent right inguinal hernia repair and removal of Port-A-Cath on 05/01/2012.  Bowels moving regularly. No rectal bleeding. He intermittently notes abdominal distention. He denies abdominal pain. No nausea or vomiting. He has a good appetite.   He continues Vicodin for pain related to the recent hernia surgery.   Objective: Blood pressure 126/77, pulse 69, temperature 97.5 F (36.4 C), temperature source Oral, resp. rate 20, height 5\' 11"  (1.803 m), weight 190 lb 3.2 oz (86.274 kg).  Oropharynx is without thrush or ulceration. No palpable cervical, supraclavicular, axillary or inguinal lymph nodes. Lungs are clear. Regular cardiac rhythm. Abdomen is soft and nontender. No organomegaly. Question mild distention. No apparent ascites. Extremities are without edema. Healing incision right inguinal region. No erythema or drainage. Healing incision left upper chest at Port-A-Cath removal site. No surrounding erythema.  Lab Results: Lab Results  Component Value Date   WBC 6.8 04/30/2012   HGB 16.5 04/30/2012   HCT 48.2 04/30/2012   MCV 94.9 04/30/2012   PLT 182 04/30/2012    Chemistry:    Chemistry      Component Value Date/Time   NA 136 05/12/2012 1018   NA 141 04/30/2012 0830   K 4.6 05/12/2012 1018   K 4.3 04/30/2012 0830   CL 100 05/12/2012 1018   CL 101 04/30/2012 0830   CO2 27 05/12/2012 1018   CO2 33* 04/30/2012 0830   BUN 11.1 05/12/2012 1018   BUN 13 04/30/2012 0830   CREATININE 1.0 05/12/2012 1018   CREATININE 0.98 04/30/2012 0830      Component Value Date/Time   CALCIUM 9.0 05/12/2012 1018   CALCIUM 8.8 04/30/2012 0830   ALKPHOS 64 02/04/2012 0445   ALKPHOS 100 10/22/2011 0946   AST 30 02/04/2012 0445   AST 38* 10/22/2011 0946   ALT 20 02/04/2012 0445   ALT 29 10/22/2011 0946   BILITOT 0.2* 02/04/2012 0445   BILITOT 0.80  10/22/2011 0946       Studies/Results: Ct Chest W Contrast  04/15/2012  *RADIOLOGY REPORT*  Clinical Data:  Colon cancer restaging.  CT CHEST, ABDOMEN AND PELVIS WITH CONTRAST  Technique:  Multidetector CT imaging of the chest, abdomen and pelvis was performed following the standard protocol during bolus administration of intravenous contrast.  Contrast: OMNIPAQUE IOHEXOL 300 MG/ML  SOLN  Comparison:  CT chest 05/15/2011 and CT abdomen and pelvis 04/03/2011.  CT CHEST  Findings:  Port-A-Cath remains in place.  There is no axillary, hilar or mediastinal lymphadenopathy.  No pleural or pericardial effusion is identified.  Heart size is normal.  Lungs demonstrate centrilobular emphysematous disease.  Mild dependent atelectasis is noted.  No nodule, mass or consolidative process is seen.  No bony lesion worrisome for metastatic disease is identified.  Schmorl's nodes in the superior endplate of T11 and about the T11-12 disc interspace are again seen.  Remote healed right third through fifth rib fractures are unchanged.  IMPRESSION:  1.  Negative for metastatic or acute disease. 2.  Emphysema.  CT ABDOMEN AND PELVIS  Findings:  The liver, adrenal glands, spleen, pancreas, gallbladder, biliary tree and right kidney appear normal.  A 0.5 cm in diameter hypoattenuating lesion in the periphery of the lower pole of the left kidney is likely a cyst but cannot be definitively characterized.  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  There is sigmoid diverticular disease  without diverticulitis.  The colon is otherwise unremarkable.  The stomach, small bowel and appendix appear normal.  No lymphadenopathy or fluid collection is identified.  Mild laxity of the anterior abdominal wall is noted. A retroaortic left renal vein is noted. No worrisome bony lesion is identified.  IMPRESSION:  1.  Negative for metastatic or acute disease. 2.  Diverticulosis without diverticulitis.   Original Report  Authenticated By: Holley Dexter, M.D.    Ct Abdomen Pelvis W Contrast  04/15/2012  *RADIOLOGY REPORT*  Clinical Data:  Colon cancer restaging.  CT CHEST, ABDOMEN AND PELVIS WITH CONTRAST  Technique:  Multidetector CT imaging of the chest, abdomen and pelvis was performed following the standard protocol during bolus administration of intravenous contrast.  Contrast: OMNIPAQUE IOHEXOL 300 MG/ML  SOLN  Comparison:  CT chest 05/15/2011 and CT abdomen and pelvis 04/03/2011.  CT CHEST  Findings:  Port-A-Cath remains in place.  There is no axillary, hilar or mediastinal lymphadenopathy.  No pleural or pericardial effusion is identified.  Heart size is normal.  Lungs demonstrate centrilobular emphysematous disease.  Mild dependent atelectasis is noted.  No nodule, mass or consolidative process is seen.  No bony lesion worrisome for metastatic disease is identified.  Schmorl's nodes in the superior endplate of T11 and about the T11-12 disc interspace are again seen.  Remote healed right third through fifth rib fractures are unchanged.  IMPRESSION:  1.  Negative for metastatic or acute disease. 2.  Emphysema.  CT ABDOMEN AND PELVIS  Findings:  The liver, adrenal glands, spleen, pancreas, gallbladder, biliary tree and right kidney appear normal.  A 0.5 cm in diameter hypoattenuating lesion in the periphery of the lower pole of the left kidney is likely a cyst but cannot be definitively characterized.  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  There is sigmoid diverticular disease without diverticulitis.  The colon is otherwise unremarkable.  The stomach, small bowel and appendix appear normal.  No lymphadenopathy or fluid collection is identified.  Mild laxity of the anterior abdominal wall is noted. A retroaortic left renal vein is noted. No worrisome bony lesion is identified.  IMPRESSION:  1.  Negative for metastatic or acute disease. 2.  Diverticulosis without diverticulitis.   Original Report  Authenticated By: Holley Dexter, M.D.     Medications: I have reviewed the patient's current medications.  Assessment/Plan:  1. 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 Adjuvant FOLFOX chemotherapy initiated 05/21/2011. Oxaliplatin was held with cycle 8 due to thrombocytopenia. He completed cycle 12 on 10/22/2011. 2. CT scan chest 05/15/2011 with no evidence of metastatic disease in the chest. Mild centrilobular and paraseptal emphysematous changes. 3. CT scan chest/abdomen/pelvis on 04/15/2012 negative for evidence of metastatic disease. 4. Normal preoperative CEA (2.4 on 04/10/2011). CEA normal at 3.1 on 10/22/2011. CEA normal at 2.3 on 03/31/2012. 5. History of bipolar disease. 6. Right inguinal hernia status post repair on 05/01/2012. 7. History of mild thrombocytopenia secondary to chemotherapy. Platelet count was normal at 182 on 04/30/2012. 8. Status post Port-A-Cath removal 05/01/2012.  Disposition-[name] appears stable. He remains in remission from colon cancer. He has been referred to Dr. Ewing Schlein for a surveillance colonoscopy. He will return for a followup visit and CEA in 6 months. He will contact the office in the interim with any problems.  Plan reviewed with Dr. Truett Perna.  Lonna Cobb ANP/GNP-BC

## 2012-05-12 NOTE — Telephone Encounter (Signed)
Patient called in for refill of pain meds. No refill since surgery so protocol hydrocodone called in.

## 2012-05-20 ENCOUNTER — Ambulatory Visit (INDEPENDENT_AMBULATORY_CARE_PROVIDER_SITE_OTHER): Payer: Medicaid Other | Admitting: Surgery

## 2012-05-20 ENCOUNTER — Encounter (INDEPENDENT_AMBULATORY_CARE_PROVIDER_SITE_OTHER): Payer: Self-pay | Admitting: Surgery

## 2012-05-20 VITALS — BP 110/64 | HR 65 | Temp 97.2°F | Resp 18 | Ht 71.0 in | Wt 189.2 lb

## 2012-05-20 DIAGNOSIS — K4090 Unilateral inguinal hernia, without obstruction or gangrene, not specified as recurrent: Secondary | ICD-10-CM

## 2012-05-20 DIAGNOSIS — C189 Malignant neoplasm of colon, unspecified: Secondary | ICD-10-CM

## 2012-05-20 NOTE — Progress Notes (Signed)
General Surgery [facility]  Visit Diagnoses: 1. Inguinal hernia unilateral, non-recurrent, right   2. Colon cancer, sigmoid     HISTORY: Patient returns for her first postoperative visit having undergone right inguinal hernia repair with mesh on 05/01/2012. Concurrently we removed his left subclavian infusion port. Postoperatively he has done well. However he has not done very well restricted his activities and has been doing yard work and lifting causing himself some discomfort and some soft tissue swelling.  EXAM: Examination of the Port-A-Cath site shows it to be well healed. No sign of seroma. No sign of infection. Right inguinal incision is well-healed. Mild soft tissue swelling. No sign of infection. No sign of recurrence.  IMPRESSION: #1 status post right inguinal hernia repair with mesh #2 status post removal of infusion port #3 personal history of colon cancer  PLAN: Patient is given the usual instructions for wound care. I've asked him to avoid strenuous lifting or physical activity for 3 more weeks.  Patient will return in 6 months for follow-up from his colon cancer.  Velora Heckler, MD, FACS General & Endocrine Surgery [facility]

## 2012-05-20 NOTE — Patient Instructions (Signed)
  COCOA BUTTER & VITAMIN E CREAM  ([employer] or other brand)  Apply cocoa butter/vitamin E cream to your incision 2 - 3 times daily.  Massage cream into incision for one minute with each application.  Use sunscreen (50 SPF or higher) for first 6 months after surgery if area is exposed to sun.  You may substitute Mederma or other scar reducing creams as desired.   

## 2012-06-08 ENCOUNTER — Other Ambulatory Visit: Payer: Self-pay | Admitting: Gastroenterology

## 2012-06-17 ENCOUNTER — Encounter (HOSPITAL_COMMUNITY): Payer: Self-pay | Admitting: Emergency Medicine

## 2012-06-17 ENCOUNTER — Emergency Department (HOSPITAL_COMMUNITY)
Admission: EM | Admit: 2012-06-17 | Discharge: 2012-06-17 | Disposition: A | Discharge status: Home / Self Care | Payer: Medicaid Other | Attending: Emergency Medicine | Admitting: Emergency Medicine

## 2012-06-17 DIAGNOSIS — Z79899 Other long term (current) drug therapy: Secondary | ICD-10-CM | POA: Insufficient documentation

## 2012-06-17 DIAGNOSIS — M545 Low back pain, unspecified: Secondary | ICD-10-CM | POA: Insufficient documentation

## 2012-06-17 DIAGNOSIS — Z8719 Personal history of other diseases of the digestive system: Secondary | ICD-10-CM | POA: Insufficient documentation

## 2012-06-17 DIAGNOSIS — J4489 Other specified chronic obstructive pulmonary disease: Secondary | ICD-10-CM | POA: Insufficient documentation

## 2012-06-17 DIAGNOSIS — Z9889 Other specified postprocedural states: Secondary | ICD-10-CM | POA: Insufficient documentation

## 2012-06-17 DIAGNOSIS — F17200 Nicotine dependence, unspecified, uncomplicated: Secondary | ICD-10-CM | POA: Insufficient documentation

## 2012-06-17 DIAGNOSIS — J449 Chronic obstructive pulmonary disease, unspecified: Secondary | ICD-10-CM | POA: Insufficient documentation

## 2012-06-17 DIAGNOSIS — M549 Dorsalgia, unspecified: Secondary | ICD-10-CM

## 2012-06-17 DIAGNOSIS — R110 Nausea: Secondary | ICD-10-CM | POA: Insufficient documentation

## 2012-06-17 DIAGNOSIS — Z98811 Dental restoration status: Secondary | ICD-10-CM | POA: Insufficient documentation

## 2012-06-17 DIAGNOSIS — F319 Bipolar disorder, unspecified: Secondary | ICD-10-CM | POA: Insufficient documentation

## 2012-06-17 DIAGNOSIS — Z8781 Personal history of (healed) traumatic fracture: Secondary | ICD-10-CM | POA: Insufficient documentation

## 2012-06-17 DIAGNOSIS — Z8679 Personal history of other diseases of the circulatory system: Secondary | ICD-10-CM | POA: Insufficient documentation

## 2012-06-17 DIAGNOSIS — Z85038 Personal history of other malignant neoplasm of large intestine: Secondary | ICD-10-CM | POA: Insufficient documentation

## 2012-06-17 MED ORDER — IBUPROFEN 600 MG PO TABS: 600.0000 mg | ORAL_TABLET | Freq: Four times a day (QID) | ORAL | Status: DC | PRN

## 2012-06-17 MED ORDER — HYDROMORPHONE HCL PF 1 MG/ML IJ SOLN
1.0000 mg | Freq: Once | INTRAMUSCULAR | Status: AC
  Administered 2012-06-17: 1 mg via INTRAVENOUS
  Filled 2012-06-17: qty 1

## 2012-06-17 MED ORDER — KETOROLAC TROMETHAMINE 30 MG/ML IJ SOLN
30.0000 mg | Freq: Once | INTRAMUSCULAR | Status: AC
  Administered 2012-06-17: 30 mg via INTRAVENOUS
  Filled 2012-06-17: qty 1

## 2012-06-17 MED ORDER — ONDANSETRON HCL 4 MG/2ML IJ SOLN
4.0000 mg | Freq: Once | INTRAMUSCULAR | Status: AC
  Administered 2012-06-17: 4 mg via INTRAVENOUS
  Filled 2012-06-17: qty 2

## 2012-06-17 MED ORDER — ALBUTEROL SULFATE (5 MG/ML) 0.5% IN NEBU
5.0000 mg | INHALATION_SOLUTION | Freq: Once | RESPIRATORY_TRACT | Status: AC
  Administered 2012-06-17: 5 mg via RESPIRATORY_TRACT
  Filled 2012-06-17: qty 1

## 2012-06-17 MED ORDER — OXYCODONE-ACETAMINOPHEN 5-325 MG PO TABS
1.0000 | ORAL_TABLET | Freq: Four times a day (QID) | ORAL | Status: DC | PRN
Start: 2012-06-17 — End: 2012-09-09

## 2012-06-17 MED ORDER — DIAZEPAM 5 MG PO TABS: 5.0000 mg | ORAL_TABLET | Freq: Four times a day (QID) | ORAL | Status: DC | PRN

## 2012-06-17 NOTE — ED Provider Notes (Signed)
History     CSN: [id number]  Arrival date & time 06/17/12  1502   First MD Initiated Contact with Patient 06/17/12 1505      Chief Complaint  Patient presents with  . Groin Pain    left    (Consider location/radiation/quality/duration/timing/severity/associated sxs/prior treatment) Patient is a 57 y.o. male presenting with groin pain. The history is provided by the patient.  Groin Pain This is a new problem. Pertinent negatives include no chest pain, no abdominal pain, no headaches and no shortness of breath.   patient was changing a tire on a car at around noon today when he had a sudden pain in his left groin and back radiating to his leg. It is worse with movement. He states it comes in spasms. No difficulty urinating. He has had nausea without vomiting. He states this is due to the pain. He has a reported small hernia on that side. He states it feels as if the pain is more on his back. He's a previous history of colon cancer but states he is cancer free after colectomy treatment. No numbness or weakness. He does not have a history of back problems. 3 weeks ago he had his right inguinal hernia repair. He states the pain does go down to his groin. Past Medical History  Diagnosis Date  . Inguinal hernia     RIGHT- PAINFUL  . Depression     Bipolar disorder  . Hemorrhoids   . Full dentures   . Rib fractures     hx of  . Bipolar 1 disorder 02/05/2012  . Shortness of breath   . COPD (chronic obstructive pulmonary disease)     SMOKER  . 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 Colon cancer 04/11/2011    Past Surgical History  Procedure Laterality Date  . Colostomy revision  04/11/2011    Procedure: COLON RESECTION SIGMOID;  Surgeon: Velora Heckler, MD;  Location: [facility];  Service: General;  Laterality: N/A;  low anterior colon resection   . Colon surgery  04/11/11    Sigmoid colectomy  . Portacath placement    . Portacath placement   05/02/2011    Procedure: INSERTION PORT-A-CATH;  Surgeon: Velora Heckler, MD;  Location: [facility];  Service: General;  Laterality: N/A;  . Inguinal hernia repair Right 05/01/2012    Procedure: HERNIA REPAIR INGUINAL ADULT;  Surgeon: Velora Heckler, MD;  Location: [facility];  Service: General;  Laterality: Right;  . Insertion of mesh Right 05/01/2012    Procedure: INSERTION OF MESH;  Surgeon: Velora Heckler, MD;  Location: [facility];  Service: General;  Laterality: Right;  . Port-a-cath removal Left 05/01/2012    Procedure: REMOVAL Infusion Port;  Surgeon: Velora Heckler, MD;  Location: [facility];  Service: General;  Laterality: Left;    Family History  Problem Relation Age of Onset  . Heart disease Father     History  Substance Use Topics  . Smoking status: Current Every Day Smoker -- 1.00 packs/day for 38 years    Types: Cigarettes  . Smokeless tobacco: Former Neurosurgeon  . Alcohol Use: Yes     Comment:  weekends      Review of Systems  Constitutional: Negative for activity change and appetite change.  HENT: Negative for neck stiffness.   Eyes: Negative for pain.  Respiratory: Negative for chest tightness and shortness of breath.   Cardiovascular: Negative for chest pain and leg  swelling.  Gastrointestinal: Positive for nausea. Negative for vomiting, abdominal pain and diarrhea.  Genitourinary: Negative for flank pain.  Musculoskeletal: Positive for back pain.  Skin: Negative for rash.  Neurological: Negative for weakness, numbness and headaches.  Psychiatric/Behavioral: Negative for behavioral problems.    Allergies  Codeine  Home Medications   Current Outpatient Rx  Name  Route  Sig  Dispense  Refill  . ARIPiprazole (ABILIFY) 2 MG tablet   Oral   Take 2 mg by mouth daily.         . clonazePAM (KLONOPIN) 0.5 MG tablet   Oral   Take 0.5 mg by mouth every morning.         . escitalopram (LEXAPRO) 20 MG tablet   Oral   Take 20 mg by mouth every morning.          . diazepam  (VALIUM) 5 MG tablet   Oral   Take 1 tablet (5 mg total) by mouth every 6 (six) hours as needed (spasm).   10 tablet   0   . ibuprofen (ADVIL,MOTRIN) 600 MG tablet   Oral   Take 1 tablet (600 mg total) by mouth every 6 (six) hours as needed for pain.   20 tablet   0   . oxyCODONE-acetaminophen (PERCOCET/ROXICET) 5-325 MG per tablet   Oral   Take 1-2 tablets by mouth every 6 (six) hours as needed for pain.   10 tablet   0     BP 128/79  Pulse 89  Temp(Src) 97.6 F (36.4 C) (Oral)  Resp 18  SpO2 88%  Physical Exam  Nursing note and vitals reviewed. Constitutional: He is oriented to person, place, and time. He appears well-developed and well-nourished.  HENT:  Head: Normocephalic and atraumatic.  Eyes: EOM are normal. Pupils are equal, round, and reactive to light.  Neck: Normal range of motion. Neck supple.  Cardiovascular: Normal rate, regular rhythm and normal heart sounds.   No murmur heard. Pulmonary/Chest: Effort normal and breath sounds normal.  Abdominal: Soft. Bowel sounds are normal. He exhibits no distension and no mass. There is tenderness. There is no rebound and no guarding.  Mild left inguinal tenderness. No mass palpated. No hernia palpated on trans-scrotal examination. No testicular tenderness.  Musculoskeletal: Normal range of motion. He exhibits no edema.  Range of motion intact in left lower extremity. Neurovascularly intact distally. Mild tenderness over left SI joint areas.  Neurological: He is alert and oriented to person, place, and time. No cranial nerve deficit.  Skin: Skin is warm and dry.  Psychiatric: He has a normal mood and affect.    ED Course  Procedures (including critical care time)  Labs Reviewed - No data to display No results found.   1. Back pain       MDM  Patient with back pain acute onset. Improved somewhat after treatment. On exam after pain medicine no inguinal tenderness. Patient has had a CAT scan within the last 3  months does not show renal stones or AAA. Likely musculoskeletal cause. Will be discharged home.        Juliet Rude. Rubin Payor, MD 06/17/12 [phone]

## 2012-06-17 NOTE — ED Notes (Signed)
[id number]<AV> Expected date:<BR> Expected time:<BR> Means of arrival:<BR> Comments:<BR> Lt groin pain/hx of hernia

## 2012-06-17 NOTE — ED Notes (Signed)
Per EMS pt comes from home c/o left groin pain that radiates down his left leg.  Pt was changing a tire and had sharp pain in the groin and leg.  PMH hernia one that was operated on three weeks ago and one hernia that wasn't.

## 2012-06-17 NOTE — ED Notes (Signed)
Pt c/o shooting pain in back. Made EDP Pickering aware.  New orders given

## 2012-07-16 ENCOUNTER — Encounter (INDEPENDENT_AMBULATORY_CARE_PROVIDER_SITE_OTHER): Payer: Self-pay

## 2012-09-09 ENCOUNTER — Encounter (HOSPITAL_COMMUNITY): Payer: Self-pay | Admitting: Emergency Medicine

## 2012-09-09 ENCOUNTER — Emergency Department (HOSPITAL_COMMUNITY): Payer: Medicaid Other

## 2012-09-09 ENCOUNTER — Emergency Department (HOSPITAL_COMMUNITY)
Admission: EM | Admit: 2012-09-09 | Discharge: 2012-09-09 | Disposition: A | Discharge status: Home / Self Care | Payer: Medicaid Other | Attending: Emergency Medicine | Admitting: Emergency Medicine

## 2012-09-09 DIAGNOSIS — R059 Cough, unspecified: Secondary | ICD-10-CM | POA: Insufficient documentation

## 2012-09-09 DIAGNOSIS — R079 Chest pain, unspecified: Secondary | ICD-10-CM

## 2012-09-09 DIAGNOSIS — R072 Precordial pain: Secondary | ICD-10-CM | POA: Insufficient documentation

## 2012-09-09 DIAGNOSIS — Z85038 Personal history of other malignant neoplasm of large intestine: Secondary | ICD-10-CM | POA: Insufficient documentation

## 2012-09-09 DIAGNOSIS — J441 Chronic obstructive pulmonary disease with (acute) exacerbation: Secondary | ICD-10-CM | POA: Insufficient documentation

## 2012-09-09 DIAGNOSIS — R0602 Shortness of breath: Secondary | ICD-10-CM

## 2012-09-09 DIAGNOSIS — Z8719 Personal history of other diseases of the digestive system: Secondary | ICD-10-CM | POA: Insufficient documentation

## 2012-09-09 DIAGNOSIS — R05 Cough: Secondary | ICD-10-CM | POA: Insufficient documentation

## 2012-09-09 DIAGNOSIS — I509 Heart failure, unspecified: Secondary | ICD-10-CM | POA: Insufficient documentation

## 2012-09-09 DIAGNOSIS — Z79899 Other long term (current) drug therapy: Secondary | ICD-10-CM | POA: Insufficient documentation

## 2012-09-09 DIAGNOSIS — R5381 Other malaise: Secondary | ICD-10-CM | POA: Insufficient documentation

## 2012-09-09 DIAGNOSIS — J449 Chronic obstructive pulmonary disease, unspecified: Secondary | ICD-10-CM

## 2012-09-09 DIAGNOSIS — R110 Nausea: Secondary | ICD-10-CM | POA: Insufficient documentation

## 2012-09-09 DIAGNOSIS — J3489 Other specified disorders of nose and nasal sinuses: Secondary | ICD-10-CM | POA: Insufficient documentation

## 2012-09-09 DIAGNOSIS — Z8679 Personal history of other diseases of the circulatory system: Secondary | ICD-10-CM | POA: Insufficient documentation

## 2012-09-09 DIAGNOSIS — R61 Generalized hyperhidrosis: Secondary | ICD-10-CM | POA: Insufficient documentation

## 2012-09-09 DIAGNOSIS — Z8781 Personal history of (healed) traumatic fracture: Secondary | ICD-10-CM | POA: Insufficient documentation

## 2012-09-09 DIAGNOSIS — J189 Pneumonia, unspecified organism: Secondary | ICD-10-CM | POA: Insufficient documentation

## 2012-09-09 DIAGNOSIS — F319 Bipolar disorder, unspecified: Secondary | ICD-10-CM | POA: Insufficient documentation

## 2012-09-09 DIAGNOSIS — F17200 Nicotine dependence, unspecified, uncomplicated: Secondary | ICD-10-CM | POA: Insufficient documentation

## 2012-09-09 LAB — CBC WITH DIFFERENTIAL/PLATELET
Basophils Absolute: 0 10*3/uL (ref 0.0–0.1)
Basophils Relative: 0 % (ref 0–1)
Eosinophils Absolute: 0.3 10*3/uL (ref 0.0–0.7)
Eosinophils Relative: 5 % (ref 0–5)
HCT: 44.3 % (ref 39.0–52.0)
Hemoglobin: 15.6 g/dL (ref 13.0–17.0)
Lymphocytes Relative: 33 % (ref 12–46)
Lymphs Abs: 2.2 10*3/uL (ref 0.7–4.0)
MCH: 32.4 pg (ref 26.0–34.0)
MCHC: 35.2 g/dL (ref 30.0–36.0)
MCV: 92.1 fL (ref 78.0–100.0)
Monocytes Absolute: 0.6 10*3/uL (ref 0.1–1.0)
Monocytes Relative: 9 % (ref 3–12)
Neutro Abs: 3.5 10*3/uL (ref 1.7–7.7)
Neutrophils Relative %: 53 % (ref 43–77)
Platelets: 157 10*3/uL (ref 150–400)
RBC: 4.81 MIL/uL (ref 4.22–5.81)
RDW: 12.8 % (ref 11.5–15.5)
WBC: 6.7 10*3/uL (ref 4.0–10.5)

## 2012-09-09 LAB — BASIC METABOLIC PANEL
BUN: 9 mg/dL (ref 6–23)
CO2: 32 mEq/L (ref 19–32)
Calcium: 9 mg/dL (ref 8.4–10.5)
Chloride: 100 mEq/L (ref 96–112)
Creatinine, Ser: 0.88 mg/dL (ref 0.50–1.35)
GFR calc Af Amer: >90 mL/min (ref >90)
GFR calc non Af Amer: >90 mL/min (ref >90)
Glucose, Bld: 92 mg/dL (ref 70–99)
Potassium: 4.3 mEq/L (ref 3.5–5.1)
Sodium: 138 mEq/L (ref 135–145)

## 2012-09-09 LAB — PRO B NATRIURETIC PEPTIDE: Pro B Natriuretic peptide (BNP): 26.6 pg/mL (ref 0–125)

## 2012-09-09 LAB — TROPONIN I: Troponin I: <0.3 ng/mL (ref <0.30)

## 2012-09-09 MED ORDER — ALBUTEROL (5 MG/ML) CONTINUOUS INHALATION SOLN
2.5000 mg/h | INHALATION_SOLUTION | Freq: Once | RESPIRATORY_TRACT | Status: DC
  Filled 2012-09-09: qty 20

## 2012-09-09 MED ORDER — ALBUTEROL SULFATE (5 MG/ML) 0.5% IN NEBU: 2.5000 mg | INHALATION_SOLUTION | Freq: Once | RESPIRATORY_TRACT | Status: DC

## 2012-09-09 MED ORDER — ALBUTEROL SULFATE HFA 108 (90 BASE) MCG/ACT IN AERS
2.0000 | INHALATION_SPRAY | RESPIRATORY_TRACT | Status: DC | PRN
  Administered 2012-09-09: 2 via RESPIRATORY_TRACT
  Filled 2012-09-09: qty 6.7

## 2012-09-09 MED ORDER — PREDNISONE 20 MG PO TABS
60.0000 mg | ORAL_TABLET | Freq: Once | ORAL | Status: AC
  Administered 2012-09-09: 60 mg via ORAL
  Filled 2012-09-09: qty 3

## 2012-09-09 MED ORDER — AZITHROMYCIN 250 MG PO TABS: 250.0000 mg | ORAL_TABLET | Freq: Every day | ORAL | Status: DC

## 2012-09-09 MED ORDER — IPRATROPIUM BROMIDE 0.02 % IN SOLN
0.5000 mg | Freq: Once | RESPIRATORY_TRACT | Status: AC
  Administered 2012-09-09: 0.5 mg via RESPIRATORY_TRACT
  Filled 2012-09-09: qty 2.5

## 2012-09-09 MED ORDER — ALBUTEROL SULFATE (5 MG/ML) 0.5% IN NEBU
2.5000 mg | INHALATION_SOLUTION | Freq: Once | RESPIRATORY_TRACT | Status: AC
  Administered 2012-09-09: 2.5 mg via RESPIRATORY_TRACT
  Filled 2012-09-09: qty 0.5

## 2012-09-09 MED ORDER — PREDNISONE 20 MG PO TABS: 40.0000 mg | ORAL_TABLET | Freq: Every day | ORAL | Status: DC

## 2012-09-09 MED ORDER — ALBUTEROL (5 MG/ML) CONTINUOUS INHALATION SOLN
10.0000 mg/h | INHALATION_SOLUTION | RESPIRATORY_TRACT | Status: AC
  Administered 2012-09-09: 10 mg/h via RESPIRATORY_TRACT

## 2012-09-09 NOTE — ED Provider Notes (Signed)
Medical screening examination/treatment/procedure(s) were conducted as a shared visit with non-physician practitioner(s) and myself.  I personally evaluated the patient during the encounter  Patient with SOB and CP.  Patient wheezing on exam.  Patient given continous neb and steroids.  Patient improved during stay.  Cxray with evidence of COPD.  Troponin neg.  Patient will be d/c'd with steroids and abx.  Follow-up with PCP and pulmonology.  EKG: Independent reviewed by myself. Normal sinus rhythm with a rate of 72.  Normal axis. No ST segment elevation noted.  Shon Baton, MD 09/10/12 [phone]

## 2012-09-09 NOTE — ED Notes (Signed)
Respiratory at bedside.

## 2012-09-09 NOTE — ED Notes (Signed)
Pt to ED via [facility] for evaluation of chest pain that has been present for the past 2 weeks.  Pt has had increased shortness of breath for the past week- admits that he quit smoking a week ago.  Upon EMS arrival pt rated pain 5/10, 324 ASA and 2 nitro administered, pain now 3/10.   Initial BP 140/80, currently 112/72.  Pt alert and oriented, NSR on monitor, IV in place.

## 2012-09-09 NOTE — ED Provider Notes (Signed)
CSN: [id number]     Arrival date & time 09/09/12  4098 History     First MD Initiated Contact with Patient 09/09/12 0756     Chief Complaint  Patient presents with  . Chest Pain   (Consider location/radiation/quality/duration/timing/severity/associated sxs/prior Treatment) HPI Pt is a 57yo male with hx of COPD c/o gradually worsening SOB over the last month, associated with intermittent dull, midsternal, chest pain and pressure.  States it is difficult to do daily tasks, gets SOB easily, unable to walking in store like he use to.  Has to take frequent breaks to catch his breath.  Reports occasional wet cough due to nasal congestion but normally dry cough. Today states he was getting out of hot shower when suddenly hit with chest pressure and SOB, stated he broke out in sweats, normally is able to stand in front of fan when this happens but SOB continued.  Denies fever, n/v/d, palpitations, leg swelling or pain. Denies sick contacts or recent travel.    Past Medical History  Diagnosis Date  . Inguinal hernia     RIGHT- PAINFUL  . Depression     Bipolar disorder  . Hemorrhoids   . Full dentures   . Rib fractures     hx of  . Bipolar 1 disorder 02/05/2012  . Shortness of breath   . COPD (chronic obstructive pulmonary disease)     SMOKER  . 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 Colon cancer 04/11/2011   Past Surgical History  Procedure Laterality Date  . Colostomy revision  04/11/2011    Procedure: COLON RESECTION SIGMOID;  Surgeon: Velora Heckler, MD;  Location: [facility];  Service: General;  Laterality: N/A;  low anterior colon resection   . Colon surgery  04/11/11    Sigmoid colectomy  . Portacath placement    . Portacath placement  05/02/2011    Procedure: INSERTION PORT-A-CATH;  Surgeon: Velora Heckler, MD;  Location: [facility];  Service: General;  Laterality: N/A;  . Inguinal hernia repair Right 05/01/2012    Procedure: HERNIA REPAIR  INGUINAL ADULT;  Surgeon: Velora Heckler, MD;  Location: [facility];  Service: General;  Laterality: Right;  . Insertion of mesh Right 05/01/2012    Procedure: INSERTION OF MESH;  Surgeon: Velora Heckler, MD;  Location: [facility];  Service: General;  Laterality: Right;  . Port-a-cath removal Left 05/01/2012    Procedure: REMOVAL Infusion Port;  Surgeon: Velora Heckler, MD;  Location: [facility];  Service: General;  Laterality: Left;   Family History  Problem Relation Age of Onset  . Heart disease Father    History  Substance Use Topics  . Smoking status: Current Every Day Smoker -- 1.00 packs/day for 38 years    Types: Cigarettes  . Smokeless tobacco: Former Neurosurgeon  . Alcohol Use: Yes     Comment:  weekends    Review of Systems  Constitutional: Positive for diaphoresis, appetite change ( decreased) and fatigue. Negative for fever.  Respiratory: Positive for cough, chest tightness, shortness of breath and wheezing.   Cardiovascular: Positive for chest pain. Negative for palpitations and leg swelling.  Gastrointestinal: Positive for nausea. Negative for vomiting, abdominal pain, diarrhea and constipation.  All other systems reviewed and are negative.    Allergies  Codeine  Home Medications   Current Outpatient Rx  Name  Route  Sig  Dispense  Refill  . ARIPiprazole (ABILIFY) 2 MG tablet  Oral   Take 2 mg by mouth daily.         Marland Kitchen aspirin 325 MG tablet   Oral   Take 325 mg by mouth daily as needed (chest pain).         . clonazePAM (KLONOPIN) 0.5 MG tablet   Oral   Take 0.5 mg by mouth every morning.         . diazepam (VALIUM) 5 MG tablet   Oral   Take 1 tablet (5 mg total) by mouth every 6 (six) hours as needed (spasm).   10 tablet   0   . escitalopram (LEXAPRO) 20 MG tablet   Oral   Take 20 mg by mouth every morning.          . traZODone (DESYREL) 100 MG tablet   Oral   Take 100 mg by mouth 3 times/day as needed-between meals & bedtime for sleep.         Marland Kitchen  azithromycin (ZITHROMAX Z-PAK) 250 MG tablet   Oral   Take 1 tablet (250 mg total) by mouth daily. 500mg  PO day 1, then 250mg  PO days 205   6 tablet   0   . predniSONE (DELTASONE) 20 MG tablet   Oral   Take 2 tablets (40 mg total) by mouth daily.   10 tablet   0    BP 109/76  Pulse 87  Temp(Src) 97.9 F (36.6 C) (Oral)  Resp 24  SpO2 100% Physical Exam  Nursing note and vitals reviewed. Constitutional: He appears well-developed and well-nourished.  HENT:  Head: Normocephalic and atraumatic.  Eyes: Conjunctivae are normal. No scleral icterus.  Neck: Normal range of motion. Neck supple.  Cardiovascular: Normal rate, regular rhythm and normal heart sounds.   Pulmonary/Chest: Effort normal. No respiratory distress. He has wheezes ( diffuse). He has rales ( diffuse). He exhibits no tenderness.  Diffuse wheezes and rales, distant lung sounds. Occasional dry cough.  No respiratory distress, able to speak in full sentences. Well healed scar on left upper chest from port-a-cath.  Abdominal: Soft. Bowel sounds are normal. He exhibits no distension and no mass. There is no tenderness. There is no rebound and no guarding.  Musculoskeletal: Normal range of motion.  Neurological: He is alert.  Skin: Skin is warm and dry.    ED Course   Procedures (including critical care time)  Labs Reviewed  CBC WITH DIFFERENTIAL  BASIC METABOLIC PANEL  PRO B NATRIURETIC PEPTIDE  TROPONIN I   Dg Chest 2 View  09/09/2012   *RADIOLOGY REPORT*  Clinical Data: Shortness of breath  CHEST - 2 VIEW  Comparison: 02/03/2012  Findings: The heart and pulmonary vascularity are within normal limits.  The lungs are clear bilaterally.  Mild hyperinflation is noted.  Old rib fractures are seen on the right and stable.  IMPRESSION: COPD without acute abnormality.   Original Report Authenticated By: Alcide Clever, M.D.   1. SOB (shortness of breath)   2. Chest pain   3. COPD (chronic obstructive pulmonary disease)      MDM  DDx: pneumonia, COPD exacerbation, CHF, PE (Well's neg).   Pt may need to be admitted for tx.  Will get CXR, CBC, BMP, troponin, BNP. Tx: albuterol, atrovent, prednisone.   CXR: COPD w/o acute abnormality Labs: unremarkable  Pt given 2 neb tx.  Still c/o SOB and had mild wheezing.  Started on continuous neb.  Due to negative workup, and no hx of CAD, will likely discharge pt  home with prednisone and inhalers.   4:29 PM Pt states he feels better.  "A little jittery now."  Discussed pt with Dr. Wilkie Aye, pt stable enough for discharge.  Will have him f/u with pulmonology and PCP, Dr. Collins Scotland.  Return precautions given. Pt verbalized understanding and agreement with tx plan. Vitals: unremarkable. Discharged in stable condition.    Discussed pt with attending during ED encounter.    Junius Finner, PA-C 09/09/12 1629

## 2012-09-17 ENCOUNTER — Encounter: Payer: Self-pay | Admitting: Internal Medicine

## 2012-09-17 ENCOUNTER — Ambulatory Visit (INDEPENDENT_AMBULATORY_CARE_PROVIDER_SITE_OTHER): Payer: Medicaid Other | Admitting: Internal Medicine

## 2012-09-17 VITALS — BP 138/78 | HR 84 | Temp 97.8°F | Ht 70.0 in | Wt 194.4 lb

## 2012-09-17 DIAGNOSIS — J449 Chronic obstructive pulmonary disease, unspecified: Secondary | ICD-10-CM

## 2012-09-17 MED ORDER — ACLIDINIUM BROMIDE 400 MCG/ACT IN AEPB: 1.0000 | INHALATION_SPRAY | Freq: Two times a day (BID) | RESPIRATORY_TRACT | Status: DC

## 2012-09-17 NOTE — Assessment & Plan Note (Addendum)
-   09/17/2012  Walked RA x 3 laps @ 185 ft each stopped due to  End of study, no desat - Spirometry 09/17/2012 FEV1  0.80 (21%) ratio 52   I doubt his fxn is as bad as his pft's suggest but clearly needs maint rx > try LAMA in from of tudorza one bid and f/u with full pfts  The proper method of use, as well as anticipated side effects, of a metered-dose inhaler are discussed and demonstrated to the patient. Improved effectiveness after extensive coaching during this visit to a level of approximately  90%     Each maintenance medication was reviewed in detail including most importantly the difference between maintenance and as needed and under what circumstances the prns are to be used.  Please see instructions for details which were reviewed in writing and the patient given a copy.

## 2012-09-17 NOTE — Patient Instructions (Addendum)
Start tudorza one twice daily   Only use your albuterol as a rescue medication to be used if you can't catch your breath by resting or doing a relaxed purse lip breathing pattern. The less you use it, the better it will work when you need it.   Please schedule a follow up office visit in 6 weeks, call sooner if needed with pfts

## 2012-09-17 NOTE — Progress Notes (Signed)
Subjective:    Patient ID: Taylor [name], male    DOB: Sep 24, 1955 MRN: [id number]  HPI  8 yowm quit smoking 09/10/12 with problems sinus congestion with spring just in 2014 just took a few clariton referred by ER for sob after evaluation there for ? Ihd> ruled out clinically   Previous eval at [facility] for sob Jan 2014 Admit date: 02/03/2012  Discharge date: 02/06/2012  Time spent: 60 minutes  Recommendations for Outpatient Follow-up:  1. Patient is to followup with PCP one week post discharge. On followup a basic metabolic profile will need to be obtained to followup on patient's electrolytes and renal function. Patient has been started on Spiriva and Symbicort during this hospitalization and his COPD exacerbation needs to be reassessed on followup. Patient may benefit from referral to pulmonologist as outpatient. 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 CT scan chest 05/15/2011 showed no evidence of metastatic disease in the chest. His preoperative CEA was 2.4 in March 2013 and again normal in October 2013.  Patient presented today with worsening shortness of breath over past few days prior to this admission associated with cough and subjective fever patient reported productive cough, yellowish sputum. Patient tried  using albuterol with only minimal symptomatic relief. Patient decided to come to emergency for further evaluation.  In ED, evaluation included chest x-ray which showed emphysematous changes consistent with COPD and/or asthma. Patient was given nebulizer treatments and Solu-Medrol in emergency room but continued to be short of breath with wheezing on physical exam.  Hospital Course:  #1 acute COPD exacerbation  Patient had presented with worsening shortness of breath a few days prior to admission with cough and subjective fevers of yellowish sputum. Patient was admitted to the telemetry floor he was placed on nebulizer treatments as well as IV Solu-Medrol. Bronchodilators were added to his regimen as well as oral Levaquin. Patient improved clinically on a daily basis his IV steroids were tapered down. Patient will be discharged home on a steroid taper, Spiriva, Symbicort, and albuterol MDI as well as 5 days of oral Levaquin. Patient be discharged home in stable and improved condition to followup with his PCP as outpatient.   09/17/2012 1st [facility] office visit/ Taylor [name] cc doe 161ft flat stops half way to mailbox all summer  2014 and no better with saba  Assoc with nasal congestion. Has luq pain worse with eat, better when lie down, not with exertion  No obvious daytime variabilty or assoc chronic cough or chest tightness, subjective wheeze overt sinus or hb symptoms. No unusual exp hx or h/o childhood pna/ asthma or knowledge of premature birth.   Sleeping ok without nocturnal  or early am exacerbation  of respiratory  c/o's or need for noct saba. Also denies any obvious fluctuation of symptoms with weather or environmental changes or other aggravating  or alleviating factors except as outlined above   Current Medications, Allergies, Past Medical History, Past Surgical History, Family History, and Social History were reviewed in [employer] record.        Review of  Systems  Constitutional: Negative for fever, chills, activity change, appetite change and unexpected weight change.  HENT: Negative for congestion, sore throat, rhinorrhea, sneezing, trouble swallowing, dental problem, voice change and postnasal drip.   Eyes: Negative for visual disturbance.  Respiratory: Positive for shortness of breath. Negative for cough and choking.   Cardiovascular: Positive for chest pain. Negative for leg swelling.  Gastrointestinal: Negative for nausea, vomiting and abdominal pain.  Genitourinary: Negative for difficulty urinating.       Heartburn indigestion  Musculoskeletal: Negative for arthralgias.  Skin: Negative for rash.  Psychiatric/Behavioral: Negative for behavioral problems and confusion.       Objective:   Physical Exam  Anxious amb wm Patient failed to answer a single question asked in a straightforward manner, tending to go off on tangents or answer questions with ambiguous medical terms or diagnoses and seemed aggravated  when asked the same question more than once for clarification.   Wt Readings from Last 3 Encounters:  09/17/12 194 lb 6.4 oz (88.179 kg)  05/20/12 189 lb 3.2 oz (85.821 kg)  05/12/12 190 lb 3.2 oz (86.274 kg)     HEENT mild turbinate edema.  Oropharynx no thrush or excess pnd or cobblestoning.  No JVD or cervical adenopathy. Mild accessory muscle hypertrophy. Trachea midline, nl thryroid. Chest was hyperinflated by percussion with diminished breath sounds and moderate increased exp time without wheeze. Hoover sign positive at mid inspiration. Regular rate and rhythm without murmur gallop or rub or increase P2 or edema.  Abd: no hsm, nl excursion. Ext warm without cyanosis or clubbing.     09/09/12 er eval reviewed cxr COPD without acute abnormality. Labs ok with neg trop, bnp <<<100      Assessment & Plan:

## 2012-11-03 ENCOUNTER — Ambulatory Visit: Payer: Medicaid Other | Admitting: Internal Medicine

## 2012-11-09 ENCOUNTER — Other Ambulatory Visit: Payer: Medicaid Other | Admitting: Lab

## 2012-11-09 ENCOUNTER — Other Ambulatory Visit: Payer: Self-pay | Admitting: *Deleted

## 2012-11-09 ENCOUNTER — Ambulatory Visit: Payer: Medicaid Other | Admitting: Oncology

## 2012-11-09 ENCOUNTER — Telehealth: Payer: Self-pay | Admitting: Oncology

## 2012-11-09 NOTE — Telephone Encounter (Signed)
Called pt and left message for November 2014, lab and MD [facility] 10/20

## 2012-11-09 NOTE — Progress Notes (Signed)
FTKA for 6 month follow up. POF to scheduler.

## 2012-11-17 ENCOUNTER — Other Ambulatory Visit: Payer: Self-pay | Admitting: Internal Medicine

## 2012-11-18 NOTE — Telephone Encounter (Signed)
LMTCB for the pt  I have refilled x 1 only and he needs ov asap with all meds in had including inhalers

## 2012-11-19 NOTE — Telephone Encounter (Signed)
LMTCB

## 2012-12-02 NOTE — Telephone Encounter (Signed)
Pt is scheduled to come in 12/15/12.

## 2012-12-11 ENCOUNTER — Telehealth: Payer: Self-pay | Admitting: Oncology

## 2012-12-11 ENCOUNTER — Other Ambulatory Visit: Payer: Medicaid Other

## 2012-12-11 ENCOUNTER — Ambulatory Visit: Payer: Medicaid Other | Admitting: Oncology

## 2012-12-11 ENCOUNTER — Other Ambulatory Visit: Payer: Self-pay | Admitting: *Deleted

## 2012-12-11 NOTE — Progress Notes (Signed)
FTKA today for lab/OV-6 month follow up. No answer at his home #. POF to scheduler to reschedule for Jan 2015-speak with patient before giving next appointment.

## 2012-12-11 NOTE — Telephone Encounter (Signed)
Called pt today no answer, will try again later

## 2012-12-15 ENCOUNTER — Other Ambulatory Visit: Payer: Medicaid Other

## 2012-12-15 ENCOUNTER — Ambulatory Visit (INDEPENDENT_AMBULATORY_CARE_PROVIDER_SITE_OTHER): Payer: Medicaid Other | Admitting: Internal Medicine

## 2012-12-15 ENCOUNTER — Encounter: Payer: Self-pay | Admitting: Internal Medicine

## 2012-12-15 VITALS — BP 140/80 | HR 75 | Temp 98.0°F | Ht 70.0 in | Wt 193.0 lb

## 2012-12-15 DIAGNOSIS — F17200 Nicotine dependence, unspecified, uncomplicated: Secondary | ICD-10-CM

## 2012-12-15 DIAGNOSIS — J449 Chronic obstructive pulmonary disease, unspecified: Secondary | ICD-10-CM

## 2012-12-15 LAB — PULMONARY FUNCTION TEST

## 2012-12-15 MED ORDER — TIOTROPIUM BROMIDE MONOHYDRATE 18 MCG IN CAPS: ORAL_CAPSULE | RESPIRATORY_TRACT | Status: DC

## 2012-12-15 MED ORDER — MOMETASONE FURO-FORMOTEROL FUM 200-5 MCG/ACT IN AERO: INHALATION_SPRAY | RESPIRATORY_TRACT | Status: DC

## 2012-12-15 NOTE — Patient Instructions (Addendum)
Stop qvar Dulera 200  Take 2 puffs first thing in am and then another 2 puffs about 12 hours later.  Work on inhaler technique:  relax and gently blow all the way out then take a nice smooth deep breath back in, triggering the inhaler at same time you start breathing in.  Hold for up to 5 seconds if you can.  Rinse and gargle with water when done   spiriva one capsule each am only  Only use your albuterol (proaire) as a rescue medication to be used if you can't catch your breath by resting or doing a relaxed purse lip breathing pattern.  - The less you use it, the better it will work when you need it. - Ok to use up to every 4 hours if you must but call for immediate appointment if use goes up over your usual need - Don't leave home without it !!  (think of it like your spare tire for your car)   The key is to stop smoking completely before smoking completely stops you!   Please remember to go to the lab   department downstairs for your tests - we will call you with the results when they are available.    Please schedule a follow up office visit in 4 weeks, sooner if needed to see Tammy to check your progress

## 2012-12-15 NOTE — Progress Notes (Signed)
Subjective:    Patient ID: Taylor [name], male    DOB: January 04, 1956 MRN: [id number]       Brief patient profile:  89 yowm active smoker sinus congestion with spring just in 2014 just took a few clariton referred by ER for sob after evaluation there for ? Ihd> ruled out clinically but GOLD IV COPD documented 12/15/2012   Previous eval at [facility] for sob Jan 2014 Admit date: 02/03/2012  Discharge date: 02/06/2012    Recommendations for Outpatient Follow-up:  1. Patient is to followup with PCP one week post discharge. On followup a basic metabolic profile will need to be obtained to followup on patient's electrolytes and renal function. Patient has been started on Spiriva and Symbicort during this hospitalization and his COPD exacerbation needs to be reassessed on followup. Patient may benefit from referral to pulmonologist as outpatient. 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 CT scan chest 05/15/2011 showed no evidence of metastatic disease in the chest. His preoperative CEA was 2.4 in March 2013 and again normal in October 2013.  Patient presented today with worsening shortness of breath over past few days prior to this admission associated with cough and subjective fever patient reported productive cough, yellowish  sputum. Patient tried using albuterol with only minimal symptomatic relief. Patient decided to come to emergency for further evaluation.  In ED, evaluation included chest x-ray which showed emphysematous changes consistent with COPD and/or asthma. Patient was given nebulizer treatments and Solu-Medrol in emergency room but continued to be short of breath with wheezing on physical exam.  Hospital Course:  #1 acute COPD exacerbation  Patient had presented with worsening shortness of breath a few days prior to admission with cough and subjective fevers of yellowish sputum. Patient was admitted to the telemetry floor he was placed on nebulizer treatments as well as IV Solu-Medrol. Bronchodilators were added to his regimen as well as oral Levaquin. Patient improved clinically on a daily basis his IV steroids were tapered down. Patient will be discharged home on a steroid taper, Spiriva, Symbicort, and albuterol MDI as well as 5 days of oral Levaquin. Patient be discharged home in stable and improved condition to followup with his PCP as outpatient.   09/17/2012 1st [facility] office visit/ Taylor [name] cc doe 100 ft flat stops half way to mailbox all summer  2014 and no better with saba  Assoc with nasal congestion. Has luq pain worse with eat, better when lie down, not with exertion rec Start tudorza one twice daily  Only use your albuterol as a rescue medication   12/15/2012 f/u ov/Taylor [name] still smoking  re: GOLD IV COPD  Chief Complaint  Patient presents with  . Follow-up    PFT done today.  Feels Qvar is not helping using rescue inhaler twice as often.  albuterol use first thing in  am 5am does help breathing and chest congestion  some LUQ pain resolved.  Doe x > slow adls   No obvious daytime variabilty or assoc excess or purulent mucus, chest tightness, subjective wheeze overt sinus or hb symptoms. No unusual exp hx or h/o childhood pna/ asthma or knowledge of premature birth.   Sleeping ok  without nocturnal  or early am exacerbation  of respiratory  c/o's or need for noct saba. Also denies any obvious fluctuation of symptoms with weather or environmental changes or other aggravating or alleviating factors except as outlined above   Current Medications, Allergies, Past Medical History, Past Surgical History, Family History, and Social History were reviewed in [employer] record.   ROS  The following are not active complaints unless bolded sore throat, dysphagia, dental problems, itching, sneezing,  nasal congestion or excess/ purulent secretions, ear ache,   fever, chills, sweats, unintended wt loss, pleuritic or exertional cp, hemoptysis,  orthopnea pnd or leg swelling, presyncope, palpitations, heartburn, abdominal pain, anorexia, nausea, vomiting, diarrhea  or change in bowel or urinary habits, change in stools or urine, dysuria,hematuria,  rash, arthralgias, visual complaints, headache, numbness weakness or ataxia or problems with walking or coordination,  change in mood/affect or memory.                  Objective:   Physical Exam  Anxious amb wm nad   Wt Readings from Last 3 Encounters:  12/15/12 193 lb (87.544 kg)  09/17/12 194 lb 6.4 oz (88.179 kg)  05/20/12 189 lb 3.2 oz (85.821 kg)        HEENT mild turbinate edema.  Oropharynx no thrush or excess pnd or cobblestoning.  No JVD or cervical adenopathy. Mild accessory muscle hypertrophy. Trachea midline, nl thryroid. Chest was hyperinflated by percussion with diminished breath sounds and moderate increased exp time without wheeze. Hoover sign positive at mid inspiration. Regular rate and rhythm without murmur gallop or rub or increase P2 or edema.  Abd: no hsm, nl excursion. Ext warm without cyanosis or clubbing.     09/09/12 er eval reviewed cxr COPD without acute abnormality. Labs ok with neg trop, bnp <<<100      Assessment & Plan:

## 2012-12-15 NOTE — Progress Notes (Signed)
PFT done today. 

## 2012-12-16 DIAGNOSIS — F17200 Nicotine dependence, unspecified, uncomplicated: Secondary | ICD-10-CM | POA: Insufficient documentation

## 2012-12-16 LAB — ALPHA-1-ANTITRYPSIN: A-1 Antitrypsin, Ser: 149 mg/dL (ref 90–200)

## 2012-12-16 NOTE — Assessment & Plan Note (Addendum)
-   09/17/2012  Walked RA x 3 laps @ 185 ft each stopped due to  End of study, no desat - Spirometry 09/17/2012 FEV1  0.80 (21%) ratio 52  - 75% 12/15/2012 - PFT's 12/15/2012  FEV1  0.89 (24%) with Ratio 38 p am saba and DLCO 44% with 68  - Alpha one screen 12/15/2012 >>  DDX of  difficult airways managment all start with A and  include Adherence, Ace Inhibitors, Acid Reflux, Active Sinus Disease, Alpha 1 Antitripsin deficiency, Anxiety masquerading as Airways dz,  ABPA,  allergy(esp in young), Aspiration (esp in elderly), Adverse effects of DPI,  Active smokers, plus two Bs  = Bronchiectasis and Beta blocker use..and one C= CHF   Adherence is always the initial "prime suspect" and is a multilayered concern that requires a "trust but verify" approach in every patient - starting with knowing how to use medications, especially inhalers, correctly, keeping up with refills and understanding the fundamental difference between maintenance and prns vs those medications only taken for a very short course and then stopped and not refilled. The proper method of use, as well as anticipated side effects, of a metered-dose inhaler are discussed and demonstrated to the patient. Improved effectiveness after extensive coaching during this visit to a level of approximately  75% so change to dulera 200 samples for now at 2 bid and add spiriva daily   Active smoking also greatest concern > see smoking discussed seperately  See instructions for specific recommendations which were reviewed directly with the patient who was given a copy with highlighter outlining the key components.

## 2012-12-16 NOTE — Assessment & Plan Note (Signed)

## 2012-12-22 ENCOUNTER — Telehealth: Payer: Self-pay | Admitting: Oncology

## 2012-12-22 NOTE — Telephone Encounter (Signed)
following up on pof sent 11/21 to r/s 11/21 f/u. per pof do not schedule w/o speaking with pt and do not lm. still not able to reach pt. pof removed from scheduling inbox and pt will need to call in to r/s. message to desk nurs who sent pof and BS.

## 2012-12-23 LAB — ALPHA-1 ANTITRYPSIN PHENOTYPE

## 2012-12-24 ENCOUNTER — Encounter: Payer: Self-pay | Admitting: Internal Medicine

## 2012-12-24 NOTE — Progress Notes (Signed)
Quick Note:  Spoke with pt and notified of results per Dr. Wert. Pt verbalized understanding and denied any questions.  ______ 

## 2013-01-09 ENCOUNTER — Emergency Department (HOSPITAL_COMMUNITY): Payer: Medicaid Other

## 2013-01-09 ENCOUNTER — Inpatient Hospital Stay (HOSPITAL_COMMUNITY)
Admission: EM | Admit: 2013-01-09 | Discharge: 2013-01-22 | DRG: 207 | Disposition: A | Discharge status: Home Health | Payer: Medicaid Other | Attending: Internal Medicine | Admitting: Internal Medicine

## 2013-01-09 ENCOUNTER — Encounter (HOSPITAL_COMMUNITY): Payer: Self-pay | Admitting: Emergency Medicine

## 2013-01-09 DIAGNOSIS — T451X5A Adverse effect of antineoplastic and immunosuppressive drugs, initial encounter: Secondary | ICD-10-CM | POA: Diagnosis present

## 2013-01-09 DIAGNOSIS — Z85038 Personal history of other malignant neoplasm of large intestine: Secondary | ICD-10-CM

## 2013-01-09 DIAGNOSIS — J441 Chronic obstructive pulmonary disease with (acute) exacerbation: Secondary | ICD-10-CM | POA: Diagnosis present

## 2013-01-09 DIAGNOSIS — J9621 Acute and chronic respiratory failure with hypoxia: Secondary | ICD-10-CM | POA: Diagnosis present

## 2013-01-09 DIAGNOSIS — J209 Acute bronchitis, unspecified: Secondary | ICD-10-CM | POA: Diagnosis present

## 2013-01-09 DIAGNOSIS — K5900 Constipation, unspecified: Secondary | ICD-10-CM | POA: Diagnosis present

## 2013-01-09 DIAGNOSIS — R490 Dysphonia: Secondary | ICD-10-CM | POA: Diagnosis present

## 2013-01-09 DIAGNOSIS — Z720 Tobacco use: Secondary | ICD-10-CM

## 2013-01-09 DIAGNOSIS — J208 Acute bronchitis due to other specified organisms: Secondary | ICD-10-CM

## 2013-01-09 DIAGNOSIS — R1013 Epigastric pain: Secondary | ICD-10-CM

## 2013-01-09 DIAGNOSIS — F1010 Alcohol abuse, uncomplicated: Secondary | ICD-10-CM | POA: Diagnosis present

## 2013-01-09 DIAGNOSIS — E872 Acidosis, unspecified: Secondary | ICD-10-CM | POA: Diagnosis present

## 2013-01-09 DIAGNOSIS — E871 Hypo-osmolality and hyponatremia: Secondary | ICD-10-CM | POA: Diagnosis present

## 2013-01-09 DIAGNOSIS — J449 Chronic obstructive pulmonary disease, unspecified: Secondary | ICD-10-CM

## 2013-01-09 DIAGNOSIS — J9600 Acute respiratory failure, unspecified whether with hypoxia or hypercapnia: Principal | ICD-10-CM | POA: Diagnosis present

## 2013-01-09 DIAGNOSIS — F17200 Nicotine dependence, unspecified, uncomplicated: Secondary | ICD-10-CM | POA: Diagnosis present

## 2013-01-09 DIAGNOSIS — D6959 Other secondary thrombocytopenia: Secondary | ICD-10-CM | POA: Diagnosis present

## 2013-01-09 DIAGNOSIS — F319 Bipolar disorder, unspecified: Secondary | ICD-10-CM

## 2013-01-09 DIAGNOSIS — R509 Fever, unspecified: Secondary | ICD-10-CM | POA: Diagnosis present

## 2013-01-09 DIAGNOSIS — J440 Chronic obstructive pulmonary disease with acute lower respiratory infection: Secondary | ICD-10-CM | POA: Diagnosis present

## 2013-01-09 DIAGNOSIS — R5381 Other malaise: Secondary | ICD-10-CM | POA: Diagnosis present

## 2013-01-09 DIAGNOSIS — Z8249 Family history of ischemic heart disease and other diseases of the circulatory system: Secondary | ICD-10-CM

## 2013-01-09 DIAGNOSIS — T380X5A Adverse effect of glucocorticoids and synthetic analogues, initial encounter: Secondary | ICD-10-CM | POA: Diagnosis present

## 2013-01-09 DIAGNOSIS — R1310 Dysphagia, unspecified: Secondary | ICD-10-CM | POA: Diagnosis present

## 2013-01-09 DIAGNOSIS — B9789 Other viral agents as the cause of diseases classified elsewhere: Secondary | ICD-10-CM | POA: Diagnosis present

## 2013-01-09 DIAGNOSIS — J9601 Acute respiratory failure with hypoxia: Secondary | ICD-10-CM

## 2013-01-09 DIAGNOSIS — Z801 Family history of malignant neoplasm of trachea, bronchus and lung: Secondary | ICD-10-CM

## 2013-01-09 DIAGNOSIS — R079 Chest pain, unspecified: Secondary | ICD-10-CM | POA: Diagnosis present

## 2013-01-09 DIAGNOSIS — I498 Other specified cardiac arrhythmias: Secondary | ICD-10-CM | POA: Diagnosis present

## 2013-01-09 DIAGNOSIS — R0789 Other chest pain: Secondary | ICD-10-CM | POA: Diagnosis present

## 2013-01-09 DIAGNOSIS — R0902 Hypoxemia: Secondary | ICD-10-CM

## 2013-01-09 LAB — BLOOD GAS, ARTERIAL
Acid-Base Excess: 1.1 mmol/L (ref 0.0–2.0)
Bicarbonate: 25.9 mEq/L — ABNORMAL HIGH (ref 20.0–24.0)
Bicarbonate: 23 mEq/L (ref 20.0–24.0)
O2 Saturation: 98.8 %
O2 Saturation: 97.8 %
Patient temperature: 98.6
Patient temperature: 98.6
TCO2: 21.9 mmol/L (ref 0–100)
TCO2: 19.9 mmol/L (ref 0–100)
pCO2 arterial: 39.6 mmHg (ref 35.0–45.0)
pH, Arterial: 7.391 (ref 7.350–7.450)
pO2, Arterial: 103 mmHg — ABNORMAL HIGH (ref 80.0–100.0)

## 2013-01-09 LAB — COMPREHENSIVE METABOLIC PANEL
ALT: 17 U/L (ref 0–53)
BUN: 14 mg/dL (ref 6–23)
Calcium: 8.8 mg/dL (ref 8.4–10.5)
GFR calc Af Amer: >90 mL/min (ref >90)
Glucose, Bld: 127 mg/dL — ABNORMAL HIGH (ref 70–99)
Sodium: 131 mEq/L — ABNORMAL LOW (ref 135–145)
Total Protein: 7.2 g/dL (ref 6.0–8.3)

## 2013-01-09 LAB — CBC WITH DIFFERENTIAL/PLATELET
Eosinophils Absolute: 0 10*3/uL (ref 0.0–0.7)
Eosinophils Relative: 0 % (ref 0–5)
Lymphs Abs: 1.4 10*3/uL (ref 0.7–4.0)
MCH: 33.9 pg (ref 26.0–34.0)
MCV: 95.4 fL (ref 78.0–100.0)
Platelets: 132 10*3/uL — ABNORMAL LOW (ref 150–400)
RBC: 5.04 MIL/uL (ref 4.22–5.81)

## 2013-01-09 LAB — URINALYSIS, ROUTINE W REFLEX MICROSCOPIC
Leukocytes, UA: NEGATIVE
Nitrite: NEGATIVE
Specific Gravity, Urine: 1.028 (ref 1.005–1.030)
pH: 6 (ref 5.0–8.0)

## 2013-01-09 LAB — INFLUENZA PANEL BY PCR (TYPE A & B)
Influenza A By PCR: NEGATIVE
Influenza B By PCR: NEGATIVE

## 2013-01-09 LAB — URINE MICROSCOPIC-ADD ON

## 2013-01-09 LAB — POCT I-STAT TROPONIN I: Troponin i, poc: 0 ng/mL (ref 0.00–0.08)

## 2013-01-09 MED ORDER — IPRATROPIUM BROMIDE 0.02 % IN SOLN
0.5000 mg | Freq: Four times a day (QID) | RESPIRATORY_TRACT | Status: DC
  Administered 2013-01-10 (×4): 0.5 mg via RESPIRATORY_TRACT
  Filled 2013-01-09 (×5): qty 2.5

## 2013-01-09 MED ORDER — ALBUTEROL (5 MG/ML) CONTINUOUS INHALATION SOLN
10.0000 mg/h | INHALATION_SOLUTION | Freq: Once | RESPIRATORY_TRACT | Status: AC
  Administered 2013-01-09: 10 mg/h via RESPIRATORY_TRACT

## 2013-01-09 MED ORDER — MOMETASONE FURO-FORMOTEROL FUM 200-5 MCG/ACT IN AERO
2.0000 | INHALATION_SPRAY | Freq: Two times a day (BID) | RESPIRATORY_TRACT | Status: DC
  Administered 2013-01-10 (×3): 2 via RESPIRATORY_TRACT
  Filled 2013-01-09: qty 8.8

## 2013-01-09 MED ORDER — ARIPIPRAZOLE 2 MG PO TABS
2.0000 mg | ORAL_TABLET | Freq: Every day | ORAL | Status: DC
  Filled 2013-01-09: qty 1

## 2013-01-09 MED ORDER — GUAIFENESIN-DM 100-10 MG/5ML PO SYRP: 5.0000 mL | ORAL_SOLUTION | ORAL | Status: DC | PRN

## 2013-01-09 MED ORDER — IPRATROPIUM BROMIDE 0.02 % IN SOLN
RESPIRATORY_TRACT | Status: AC
  Filled 2013-01-09: qty 5

## 2013-01-09 MED ORDER — CLONAZEPAM 0.5 MG PO TABS: 0.5000 mg | ORAL_TABLET | Freq: Every day | ORAL | Status: DC

## 2013-01-09 MED ORDER — SODIUM CHLORIDE 0.9 % IV SOLN
INTRAVENOUS | Status: AC
  Administered 2013-01-10: via INTRAVENOUS

## 2013-01-09 MED ORDER — SODIUM CHLORIDE 0.9 % IJ SOLN
3.0000 mL | Freq: Two times a day (BID) | INTRAMUSCULAR | Status: DC
  Administered 2013-01-10 – 2013-01-20 (×18): 3 mL via INTRAVENOUS
  Administered 2013-01-21: 10:00:00 via INTRAVENOUS
  Administered 2013-01-21 – 2013-01-22 (×2): 3 mL via INTRAVENOUS

## 2013-01-09 MED ORDER — ONDANSETRON HCL 4 MG/2ML IJ SOLN
4.0000 mg | Freq: Four times a day (QID) | INTRAMUSCULAR | Status: DC | PRN
  Administered 2013-01-10: 4 mg via INTRAVENOUS
  Filled 2013-01-09 (×2): qty 2

## 2013-01-09 MED ORDER — CLONAZEPAM 0.5 MG PO TABS
0.5000 mg | ORAL_TABLET | Freq: Every day | ORAL | Status: DC
  Administered 2013-01-10 (×2): 0.5 mg via ORAL
  Filled 2013-01-09 (×2): qty 1

## 2013-01-09 MED ORDER — ESCITALOPRAM OXALATE 20 MG PO TABS: 20.0000 mg | ORAL_TABLET | Freq: Every day | ORAL | Status: DC

## 2013-01-09 MED ORDER — NICOTINE 21 MG/24HR TD PT24
21.0000 mg | MEDICATED_PATCH | Freq: Every day | TRANSDERMAL | Status: DC
  Administered 2013-01-10: 21 mg via TRANSDERMAL
  Filled 2013-01-09: qty 1

## 2013-01-09 MED ORDER — METHYLPREDNISOLONE SODIUM SUCC 125 MG IJ SOLR
60.0000 mg | Freq: Two times a day (BID) | INTRAMUSCULAR | Status: DC
  Administered 2013-01-10 (×2): 60 mg via INTRAVENOUS
  Filled 2013-01-09 (×3): qty 0.96

## 2013-01-09 MED ORDER — ALBUTEROL SULFATE (2.5 MG/3ML) 0.083% IN NEBU
2.5000 mg | INHALATION_SOLUTION | RESPIRATORY_TRACT | Status: DC | PRN
  Administered 2013-01-15 – 2013-01-19 (×6): 2.5 mg via RESPIRATORY_TRACT
  Filled 2013-01-09: qty 0.5
  Filled 2013-01-09: qty 3
  Filled 2013-01-09: qty 0.5
  Filled 2013-01-09 (×2): qty 3
  Filled 2013-01-09 (×2): qty 0.5
  Filled 2013-01-09: qty 3

## 2013-01-09 MED ORDER — METHYLPREDNISOLONE SODIUM SUCC 125 MG IJ SOLR
125.0000 mg | Freq: Once | INTRAMUSCULAR | Status: AC
  Administered 2013-01-09: 125 mg via INTRAVENOUS
  Filled 2013-01-09: qty 2

## 2013-01-09 MED ORDER — LEVOFLOXACIN IN D5W 750 MG/150ML IV SOLN
750.0000 mg | INTRAVENOUS | Status: DC
  Administered 2013-01-10 – 2013-01-18 (×9): 750 mg via INTRAVENOUS
  Filled 2013-01-09 (×9): qty 150

## 2013-01-09 MED ORDER — ACETAMINOPHEN 650 MG RE SUPP: 650.0000 mg | Freq: Four times a day (QID) | RECTAL | Status: DC | PRN

## 2013-01-09 MED ORDER — ALBUTEROL (5 MG/ML) CONTINUOUS INHALATION SOLN
10.0000 mg/h | INHALATION_SOLUTION | RESPIRATORY_TRACT | Status: DC
  Administered 2013-01-09: 10 mg/h via RESPIRATORY_TRACT

## 2013-01-09 MED ORDER — ALBUTEROL (5 MG/ML) CONTINUOUS INHALATION SOLN
10.0000 mg/h | INHALATION_SOLUTION | Freq: Once | RESPIRATORY_TRACT | Status: AC
  Administered 2013-01-09: 10 mg/h via RESPIRATORY_TRACT
  Filled 2013-01-09: qty 20

## 2013-01-09 MED ORDER — ACETAMINOPHEN 325 MG PO TABS
650.0000 mg | ORAL_TABLET | Freq: Once | ORAL | Status: AC
  Administered 2013-01-09: 650 mg via ORAL
  Filled 2013-01-09: qty 2

## 2013-01-09 MED ORDER — ACETAMINOPHEN 325 MG PO TABS: 650.0000 mg | ORAL_TABLET | Freq: Four times a day (QID) | ORAL | Status: DC | PRN

## 2013-01-09 MED ORDER — PANTOPRAZOLE SODIUM 40 MG IV SOLR
40.0000 mg | Freq: Every day | INTRAVENOUS | Status: DC
  Administered 2013-01-10: 40 mg via INTRAVENOUS
  Filled 2013-01-09 (×2): qty 40

## 2013-01-09 MED ORDER — ARIPIPRAZOLE 2 MG PO TABS: 2.0000 mg | ORAL_TABLET | Freq: Every day | ORAL | Status: DC

## 2013-01-09 MED ORDER — ONDANSETRON HCL 4 MG PO TABS: 4.0000 mg | ORAL_TABLET | Freq: Four times a day (QID) | ORAL | Status: DC | PRN

## 2013-01-09 MED ORDER — ESCITALOPRAM OXALATE 20 MG PO TABS
20.0000 mg | ORAL_TABLET | Freq: Every day | ORAL | Status: DC
  Administered 2013-01-10 (×2): 20 mg via ORAL
  Filled 2013-01-09 (×2): qty 1

## 2013-01-09 MED ORDER — ARIPIPRAZOLE 2 MG PO TABS
2.0000 mg | ORAL_TABLET | Freq: Every day | ORAL | Status: DC
  Administered 2013-01-10 (×2): 2 mg via ORAL
  Filled 2013-01-09 (×2): qty 1

## 2013-01-09 MED ORDER — HYDROCODONE-ACETAMINOPHEN 5-325 MG PO TABS
1.0000 | ORAL_TABLET | ORAL | Status: DC | PRN
  Administered 2013-01-10: 2 via ORAL
  Administered 2013-01-10: 1 via ORAL
  Filled 2013-01-09: qty 1
  Filled 2013-01-09: qty 2

## 2013-01-09 MED ORDER — ALBUTEROL (5 MG/ML) CONTINUOUS INHALATION SOLN
INHALATION_SOLUTION | RESPIRATORY_TRACT | Status: AC
  Filled 2013-01-09: qty 20

## 2013-01-09 MED ORDER — GUAIFENESIN ER 600 MG PO TB12
600.0000 mg | ORAL_TABLET | Freq: Two times a day (BID) | ORAL | Status: DC
  Administered 2013-01-10 (×2): 600 mg via ORAL
  Filled 2013-01-09 (×3): qty 1

## 2013-01-09 MED ORDER — ENOXAPARIN SODIUM 40 MG/0.4ML ~~LOC~~ SOLN
40.0000 mg | Freq: Every day | SUBCUTANEOUS | Status: DC
  Administered 2013-01-10 – 2013-01-21 (×12): 40 mg via SUBCUTANEOUS
  Filled 2013-01-09 (×14): qty 0.4

## 2013-01-09 MED ORDER — DOCUSATE SODIUM 100 MG PO CAPS
100.0000 mg | ORAL_CAPSULE | Freq: Two times a day (BID) | ORAL | Status: DC
  Administered 2013-01-10 – 2013-01-13 (×7): 100 mg via ORAL
  Filled 2013-01-09 (×11): qty 1

## 2013-01-09 MED ORDER — IPRATROPIUM BROMIDE 0.02 % IN SOLN
1.0000 mg | Freq: Once | RESPIRATORY_TRACT | Status: AC
  Administered 2013-01-09: 1 mg via RESPIRATORY_TRACT

## 2013-01-09 NOTE — Progress Notes (Signed)
Order noted for BiPAP. Patient assessed. Spoke with Dr. Adela Glimpse; recommended repeat CAT with 10mg  albuterol and defer BiPAP for now. Order received to proceed with CAT and repeat ABG.

## 2013-01-09 NOTE — ED Notes (Signed)
Bed: WA09 Expected date:  Expected time:  Means of arrival:  Comments: EMS/Shob 

## 2013-01-09 NOTE — ED Notes (Signed)
Per PA I can come back and get EKG patient getting a breathing treatment

## 2013-01-09 NOTE — ED Notes (Signed)
Pt. Finished neb tx. Pt breathing at 90% on room air. Placed on 2L and is now at 95%. MD made aware.

## 2013-01-09 NOTE — H&P (Addendum)
PCP:  MASSENBURG,O'LAF, PA-C  [facility]  Chief Complaint:  Shortness of breath  HPI: Taylor [name] is a 57 y.o. male   has a past medical history of Inguinal hernia; Depression; Hemorrhoids; Full dentures; Rib fractures; Bipolar 1 disorder (02/05/2012); Shortness of breath; COPD (chronic obstructive pulmonary disease); Cancer (04/11/11); and Colon cancer (04/11/2011).   Presented with  3 day hx of worsening shortness of breath, fever, coughing up green sputum. He reports chest pain and aching all over. Reports chest tihtness and sensation like someone hit him in the chest.  He reports drinking on weekends about 6-12 beers. Denies episodes of withdrawal. Reports that his next door neighbor had similar symptoms.   Review of Systems:    Pertinent positives include: Fevers, chills, fatigue, vomiting, diarrhea, shortness of breath at rest. dyspnea on exertion,  excess mucus,  productive cough,  Constitutional:  No weight loss, night sweats, weight loss  HEENT:  No headaches, Difficulty swallowing,Tooth/dental problems,Sore throat,  No sneezing, itching, ear ache, nasal congestion, post nasal drip,  Cardio-vascular:  No chest pain, Orthopnea, PND, anasarca, dizziness, palpitations.no Bilateral lower extremity swelling  GI:  No heartburn, indigestion, abdominal pain, nausea, change in bowel habits, loss of appetite, melena, blood in stool, hematemesis Resp:  no  No No non-productive cough, No coughing up of blood.No change in color of mucus.No wheezing. Skin:  no rash or lesions. No jaundice GU:  no dysuria, change in color of urine, no urgency or frequency. No straining to urinate.  No flank pain.  Musculoskeletal:  No joint pain or no joint swelling. No decreased range of motion. No back pain.  Psych:  No change in mood or affect. No depression or anxiety. No memory loss.  Neuro: no localizing neurological complaints, no tingling, no weakness, no double vision, no gait  abnormality, no slurred speech, no confusion  Otherwise ROS are negative except for above, 10 systems were reviewed  Past Medical History: Past Medical History  Diagnosis Date  . Inguinal hernia     RIGHT- PAINFUL  . Depression     Bipolar disorder  . Hemorrhoids   . Full dentures   . Rib fractures     hx of  . Bipolar 1 disorder 02/05/2012  . Shortness of breath   . COPD (chronic obstructive pulmonary disease)     SMOKER  . 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 Colon cancer 04/11/2011   Past Surgical History  Procedure Laterality Date  . Colostomy revision  04/11/2011    Procedure: COLON RESECTION SIGMOID;  Surgeon: Velora Heckler, MD;  Location: [facility];  Service: General;  Laterality: N/A;  low anterior colon resection   . Colon surgery  04/11/11    Sigmoid colectomy  . Portacath placement    . Portacath placement  05/02/2011    Procedure: INSERTION PORT-A-CATH;  Surgeon: Velora Heckler, MD;  Location: [facility];  Service: General;  Laterality: N/A;  . Inguinal hernia repair Right 05/01/2012    Procedure: HERNIA REPAIR INGUINAL ADULT;  Surgeon: Velora Heckler, MD;  Location: [facility];  Service: General;  Laterality: Right;  . Insertion of mesh Right 05/01/2012    Procedure: INSERTION OF MESH;  Surgeon: Velora Heckler, MD;  Location: [facility];  Service: General;  Laterality: Right;  . Port-a-cath removal Left 05/01/2012    Procedure: REMOVAL Infusion Port;  Surgeon: Velora Heckler, MD;  Location: [facility];  Service: General;  Laterality:  Left;     Medications: Prior to Admission medications   Medication Sig Start Date End Date Taking? Authorizing Provider  ARIPiprazole (ABILIFY) 2 MG tablet Take 2 mg by mouth daily.   Yes Historical Provider, MD  clonazePAM (KLONOPIN) 0.5 MG tablet Take 0.5 mg by mouth every morning.   Yes Historical Provider, MD  escitalopram (LEXAPRO) 20 MG tablet Take 20 mg by mouth every morning.    Yes Historical  Provider, MD  mometasone-formoterol (DULERA) 200-5 MCG/ACT AERO Take 2 puffs first thing in am and then another 2 puffs about 12 hours later. 12/15/12  Yes Nyoka Cowden, MD  tiotropium [facility] HANDIHALER) 18 MCG inhalation capsule One capsule each am on wakening 12/15/12  Yes Nyoka Cowden, MD    Allergies:   Allergies  Allergen Reactions  . Codeine Nausea And Vomiting    Social History:  Ambulatory   Independently  Lives at  Home alone   reports that he has been smoking Cigarettes.  He has a 19 pack-year smoking history. He has quit using smokeless tobacco. He reports that he drinks alcohol. He reports that he does not use illicit drugs.   Family History: family history includes Heart disease in his father; Lung cancer in his maternal uncle.    Physical Exam: Patient Vitals for the past 24 hrs:  BP Temp Temp src Pulse Resp SpO2  01/09/13 1951 - 99.8 F (37.7 C) - - - -  01/09/13 1929 109/69 mmHg 98.4 F (36.9 C) Oral 104 27 98 %  01/09/13 1541 104/85 mmHg - - - - -  01/09/13 1538 - 101.7 F (38.7 C) - - - -  01/09/13 1517 140/85 mmHg - - 130 26 100 %  01/09/13 1515 - - - - - 89 %  01/09/13 1512 - - - - - 86 %    1. General:  in No Acute distress 2. Psychological: Alert and  Oriented 3. Head/ENT:     Dry Mucous Membranes                          Head Non traumatic, neck supple                          Normal   Dentition 4. SKIN:  decreased Skin turgor,  Skin clean Dry and intact no rash 5. Heart: rapid Regular rate and rhythm no Murmur, Rub or gallop 6. Lungs: wheezes bilateraly with poor air movement 7. Abdomen: Soft, non-tender, Non distended 8. Lower extremities: no clubbing, cyanosis, or edema 9. Neurologically Grossly intact, moving all 4 extremities equally 10. MSK: Normal range of motion  body mass index is unknown because there is no weight on file.   Labs on Admission:   Recent Labs  01/09/13 1520  NA 131*  K 4.2  CL 94*  CO2 26  GLUCOSE  127*  BUN 14  CREATININE 1.03  CALCIUM 8.8    Recent Labs  01/09/13 1520  AST 27  ALT 17  ALKPHOS 82  BILITOT 0.5  PROT 7.2  ALBUMIN 4.0   No results found for this basename: LIPASE, AMYLASE,  in the last 72 hours  Recent Labs  01/09/13 1520  WBC 6.3  NEUTROABS 4.2  HGB 17.1*  HCT 48.1  MCV 95.4  PLT 132*    Recent Labs  01/09/13 1520  TROPONINI <0.30   No results found for this basename: TSH, T4TOTAL,  FREET3, T3FREE, THYROIDAB,  in the last 72 hours No results found for this basename: VITAMINB12, FOLATE, FERRITIN, TIBC, IRON, RETICCTPCT,  in the last 72 hours Lab Results  Component Value Date   HGBA1C 6.2* 02/05/2012    The CrCl is unknown because both a height and weight (above a minimum accepted value) are required for this calculation. ABG    Component Value Date/Time   PHART 7.391 01/09/2013 1524   HCO3 25.9* 01/09/2013 1524   TCO2 21.9 01/09/2013 1524   O2SAT 98.8 01/09/2013 1524     No results found for this basename: DDIMER     Other results:  I have pearsonaly reviewed this: ECG REPORT  Rate: 129  Rhythm: sinus tachycardia ST&T Change: no ischemia  UA no evidence of infection    Cultures:    Component Value Date/Time   SDES URINE, RANDOM 02/03/2012 1304   SPECREQUEST NONE 02/03/2012 1304   CULT NO GROWTH 02/03/2012 1304   REPTSTATUS 02/04/2012 FINAL 02/03/2012 1304       Radiological Exams on Admission: Dg Chest Port 1 View  01/09/2013   CLINICAL DATA:  Shortness of breath, cough.  EXAM: PORTABLE CHEST - 1 VIEW  COMPARISON:  09/09/2012  FINDINGS: There is hyperinflation of the lungs compatible with COPD. Trace left pleural effusion. No confluent airspace opacities. Heart is normal size. Old healed right rib fractures.  IMPRESSION: COPD.  Small left pleural effusion.   Electronically Signed   By: Charlett Nose M.D.   On: 01/09/2013 16:07    Chart has been reviewed  Assessment/Plan  57 yo male wit hx of tobacco abuse, alcohol use  with no hx of withdrawal and severe COPD here with hypoxia and acute respiratory failure due to COPD exacerbation.  Present on Admission:  Acute respiratory failure with hypoxia improved on O2, increased work of breathing, admit to step down, bipap as needed. CCM e link . COPD with exacerbation - admit to stepdown  - Will initiate solumedrol  taper, start on levaquin, Albuterol PRN, scheduled Atrovent, Dulera and Mucinex. Titrate O2 to saturation >90%. Follow patients respiratory status. Obtain ABG. Evaluate for flu with PCR. droplet precautions Febrile illness possible flu too far out for tamiflu . Alcohol abuse - no hx of withdrawal states last EtOH 5 days ago will monitor. Chest pain - cycle CE, serial ECG, echo, likely due to pulmonary problmes tobacco abuse - nicotine patch and discussed importance of quitting.   Prophylaxis:   Lovenox, Protonix  CODE STATUS: FULL CODE  Other plan as per orders.  I have spent a total of 65 min on this admission time taken to coordinate care with RT and CCM  Adrionna Delcid 01/09/2013, 9:40 PM

## 2013-01-09 NOTE — ED Notes (Signed)
Pt comes in from home with complaints of SOB for the past couple days. Comes in resp distress and anxious; EMS gave albuterol and then a second treatment duoneb.

## 2013-01-09 NOTE — ED Provider Notes (Signed)
CSN: [id number]     Arrival date & time 01/09/13  1503 History   First MD Initiated Contact with Patient 01/09/13 1510     Chief Complaint  Patient presents with  . Shortness of Breath   (Consider location/radiation/quality/duration/timing/severity/associated sxs/prior Treatment) HPI Comments: Taylor [name] is a 57 y.o. male presents by EMS for evaluation of shortness of breath. He is using medications at home without relief. EMS did have a 2 albuterol nebulizers and one Atrovent nebulizer. Patient complains of feeling hot and having shortness of breath.  Level V caveat: Severe illness   The history is provided by the patient.    Past Medical History  Diagnosis Date  . Inguinal hernia     RIGHT- PAINFUL  . Depression     Bipolar disorder  . Hemorrhoids   . Full dentures   . Rib fractures     hx of  . Bipolar 1 disorder 02/05/2012  . Shortness of breath   . COPD (chronic obstructive pulmonary disease)     SMOKER  . 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 Colon cancer 04/11/2011   Past Surgical History  Procedure Laterality Date  . Colostomy revision  04/11/2011    Procedure: COLON RESECTION SIGMOID;  Surgeon: Velora Heckler, MD;  Location: [facility];  Service: General;  Laterality: N/A;  low anterior colon resection   . Colon surgery  04/11/11    Sigmoid colectomy  . Portacath placement    . Portacath placement  05/02/2011    Procedure: INSERTION PORT-A-CATH;  Surgeon: Velora Heckler, MD;  Location: [facility];  Service: General;  Laterality: N/A;  . Inguinal hernia repair Right 05/01/2012    Procedure: HERNIA REPAIR INGUINAL ADULT;  Surgeon: Velora Heckler, MD;  Location: [facility];  Service: General;  Laterality: Right;  . Insertion of mesh Right 05/01/2012    Procedure: INSERTION OF MESH;  Surgeon: Velora Heckler, MD;  Location: [facility];  Service: General;  Laterality: Right;  . Port-a-cath removal Left 05/01/2012    Procedure:  REMOVAL Infusion Port;  Surgeon: Velora Heckler, MD;  Location: [facility];  Service: General;  Laterality: Left;   Family History  Problem Relation Age of Onset  . Heart disease Father   . Lung cancer Maternal Uncle     smoked   History  Substance Use Topics  . Smoking status: Current Every Day Smoker -- 0.50 packs/day for 38 years    Types: Cigarettes    Last Attempt to Quit: 09/10/2012  . Smokeless tobacco: Former Neurosurgeon  . Alcohol Use: Yes     Comment:  weekends    Review of Systems  Unable to perform ROS   Allergies  Codeine  Home Medications   Current Outpatient Rx  Name  Route  Sig  Dispense  Refill  . ARIPiprazole (ABILIFY) 2 MG tablet   Oral   Take 2 mg by mouth daily.         . clonazePAM (KLONOPIN) 0.5 MG tablet   Oral   Take 0.5 mg by mouth every morning.         . escitalopram (LEXAPRO) 20 MG tablet   Oral   Take 20 mg by mouth every morning.          . mometasone-formoterol (DULERA) 200-5 MCG/ACT AERO      Take 2 puffs first thing in am and then another 2 puffs about 12 hours later.         Marland Kitchen  PROAIR HFA 108 (90 BASE) MCG/ACT inhaler      INHALE 2 PUFFS INTO THE LUNGS 3 TIMES A DAY AS NEEDED   8.5 each   0     PATIENT GOT A PROAIR ALSO ON 11/03/12 .Marland Kitchen I HAVE CA ...   . tiotropium (SPIRIVA HANDIHALER) 18 MCG inhalation capsule      One capsule each am on wakening          BP 109/69  Pulse 104  Temp(Src) 99.8 F (37.7 C) (Oral)  Resp 27  SpO2 98% Physical Exam  Nursing note and vitals reviewed. Constitutional: He is oriented to person, place, and time. He appears well-developed and well-nourished. He appears distressed (Uncomfortable).  HENT:  Head: Normocephalic and atraumatic.  Right Ear: External ear normal.  Left Ear: External ear normal.  Eyes: Conjunctivae and EOM are normal. Pupils are equal, round, and reactive to light.  Neck: Normal range of motion and phonation normal. Neck supple.  Cardiovascular: Normal rate, regular  rhythm, normal heart sounds and intact distal pulses.   Pulmonary/Chest: Effort normal and breath sounds normal. He has no rales. He exhibits no tenderness and no bony tenderness.  Tripod position. Poor air movement bilaterally. Scattered wheezing. No rales or rhonchi  Abdominal: Soft. Normal appearance. There is no tenderness.  Musculoskeletal: Normal range of motion. He exhibits no edema and no tenderness.  Neurological: He is alert and oriented to person, place, and time. No cranial nerve deficit or sensory deficit. He exhibits normal muscle tone. Coordination normal.  Skin: Skin is warm, dry and intact.  Psychiatric: He has a normal mood and affect. His behavior is normal. Judgment and thought content normal.    ED Course  Procedures (including critical care time)  1505- He is able to talk in short sentances. Resp. Distress with tripod position. Decreased air mvt bilat. Smoker. No O2 at home. Will give additional neb. Consider BiPAP. Evaluate for influenza and PNE. Marland Kitchen Medications  methylPREDNISolone sodium succinate (SOLU-MEDROL) 125 mg/2 mL injection 125 mg (125 mg Intravenous Given 01/09/13 1522)  albuterol (PROVENTIL,VENTOLIN) solution continuous neb (10 mg/hr Nebulization Given 01/09/13 1536)  acetaminophen (TYLENOL) tablet 650 mg (650 mg Oral Given 01/09/13 1547)  albuterol (PROVENTIL,VENTOLIN) solution continuous neb (10 mg/hr Nebulization Given 01/09/13 1650)   ' Patient Vitals for the past 24 hrs:  BP Temp Temp src Pulse Resp SpO2  01/09/13 1951 - 99.8 F (37.7 C) - - - -  01/09/13 1929 109/69 mmHg 98.4 F (36.9 C) Oral 104 27 98 %  01/09/13 1541 104/85 mmHg - - - - -  01/09/13 1538 - 101.7 F (38.7 C) - - - -  01/09/13 1517 140/85 mmHg - - 130 26 100 %  01/09/13 1515 - - - - - 89 %  01/09/13 1512 - - - - - 86 %   3:58 PM Reevaluation with update and discussion. After initial assessment and treatment, an updated evaluation reveals he is resting more comfortably, now,  able to lie in a semi-Fowler's position. Katilynn Sinkler L   8:59 PM Reevaluation with update and discussion. After initial assessment and treatment, an updated evaluation reveals he is now resting comfortably, supine with oxygen saturation 100% on 2 L nasal cannula. He, states that he is ready to stop smoking and drinking alcohol. Kaye Mitro L   CRITICAL CARE Performed by: Mancel Bale L Total critical care time:45 minutes Critical care time was exclusive of separately billable procedures and treating other patients. Critical care was necessary to treat or  prevent imminent or life-threatening deterioration. Critical care was time spent personally by me on the following activities: development of treatment plan with patient and/or surrogate as well as nursing, discussions with consultants, evaluation of patient's response to treatment, examination of patient, obtaining history from patient or surrogate, ordering and performing treatments and interventions, ordering and review of laboratory studies, ordering and review of radiographic studies, pulse oximetry and re-evaluation of patient's condition.  Labs Review Labs Reviewed  CBC WITH DIFFERENTIAL - Abnormal; Notable for the following:    Hemoglobin 17.1 (*)    Platelets 132 (*)    All other components within normal limits  COMPREHENSIVE METABOLIC PANEL - Abnormal; Notable for the following:    Sodium 131 (*)    Chloride 94 (*)    Glucose, Bld 127 (*)    GFR calc non Af Amer 78 (*)    All other components within normal limits  URINALYSIS, ROUTINE W REFLEX MICROSCOPIC - Abnormal; Notable for the following:    Color, Urine AMBER (*)    APPearance TURBID (*)    Bilirubin Urine SMALL (*)    Protein, ur 100 (*)    All other components within normal limits  BLOOD GAS, ARTERIAL - Abnormal; Notable for the following:    pO2, Arterial 145.0 (*)    Bicarbonate 25.9 (*)    All other components within normal limits  URINE MICROSCOPIC-ADD ON -  Abnormal; Notable for the following:    Bacteria, UA FEW (*)    Crystals CA OXALATE CRYSTALS (*)    All other components within normal limits  URINE CULTURE  CULTURE, BLOOD (ROUTINE X 2)  CULTURE, BLOOD (ROUTINE X 2)  TROPONIN I  INFLUENZA PANEL BY PCR  POCT I-STAT TROPONIN I   Imaging Review Dg Chest Port 1 View  01/09/2013   CLINICAL DATA:  Shortness of breath, cough.  EXAM: PORTABLE CHEST - 1 VIEW  COMPARISON:  09/09/2012  FINDINGS: There is hyperinflation of the lungs compatible with COPD. Trace left pleural effusion. No confluent airspace opacities. Heart is normal size. Old healed right rib fractures.  IMPRESSION: COPD.  Small left pleural effusion.   Electronically Signed   By: Charlett Nose M.D.   On: 01/09/2013 16:07    EKG Interpretation    Date/Time:  Saturday January 09 2013 15:29:06 EST Ventricular Rate:  129 PR Interval:  120 QRS Duration: 67 QT Interval:  262 QTC Calculation: 384 R Axis:   -83 Text Interpretation:  Sinus tachycardia LAE, consider biatrial enlargement Inferior infarct, old Consider anterior infarct Since last tracing rate faster Confirmed by [facility]  MD, Jetta Murray ([id number]) on 01/09/2013 4:02:46 PM            MDM   1. COPD exacerbation   2. Hypoxia   3. Tobacco abuse       Exacerbation COPD, improved with treatment. He'll need to be admitted for further observation at least overnight. Doubt pneumonia, PE, ACS, or SBI.   Nursing Notes Reviewed/ Care Coordinated, and agree without changes. Applicable Imaging Reviewed.  Interpretation of Laboratory Data incorporated into ED treatment  Plan: Admit    Flint Melter, MD 01/10/13 [phone]

## 2013-01-10 ENCOUNTER — Inpatient Hospital Stay (HOSPITAL_COMMUNITY): Payer: Medicaid Other

## 2013-01-10 ENCOUNTER — Inpatient Hospital Stay (HOSPITAL_COMMUNITY): Payer: Medicaid Other | Admitting: *Deleted

## 2013-01-10 ENCOUNTER — Encounter (HOSPITAL_COMMUNITY): Payer: Medicaid Other | Admitting: *Deleted

## 2013-01-10 DIAGNOSIS — I517 Cardiomegaly: Secondary | ICD-10-CM

## 2013-01-10 DIAGNOSIS — F319 Bipolar disorder, unspecified: Secondary | ICD-10-CM

## 2013-01-10 LAB — COMPREHENSIVE METABOLIC PANEL
ALT: 18 U/L (ref 0–53)
AST: 26 U/L (ref 0–37)
Albumin: 3.6 g/dL (ref 3.5–5.2)
Alkaline Phosphatase: 70 U/L (ref 39–117)
BUN: 21 mg/dL (ref 6–23)
CO2: 24 mEq/L (ref 19–32)
Calcium: 8.7 mg/dL (ref 8.4–10.5)
Chloride: 93 mEq/L — ABNORMAL LOW (ref 96–112)
Creatinine, Ser: 1.21 mg/dL (ref 0.50–1.35)
GFR calc non Af Amer: 64 mL/min — ABNORMAL LOW (ref >90)
Sodium: 130 mEq/L — ABNORMAL LOW (ref 135–145)

## 2013-01-10 LAB — TSH: TSH: 0.569 u[IU]/mL (ref 0.350–4.500)

## 2013-01-10 LAB — BLOOD GAS, ARTERIAL
Acid-base deficit: 4.4 mmol/L — ABNORMAL HIGH (ref 0.0–2.0)
Bicarbonate: 25.6 mEq/L — ABNORMAL HIGH (ref 20.0–24.0)
FIO2: 0.6 %
Mode: POSITIVE
O2 Saturation: 91.1 %
PEEP: 6 cmH2O
Patient temperature: 98.6
Pressure support: 10 cmH2O
pCO2 arterial: 68.3 mmHg (ref 35.0–45.0)
pH, Arterial: 7.199 — CL (ref 7.350–7.450)
pO2, Arterial: 69.7 mmHg — ABNORMAL LOW (ref 80.0–100.0)

## 2013-01-10 LAB — CBC
Hemoglobin: 15.4 g/dL (ref 13.0–17.0)
MCH: 33.3 pg (ref 26.0–34.0)
MCHC: 35.1 g/dL (ref 30.0–36.0)
MCV: 95 fL (ref 78.0–100.0)
RBC: 4.62 MIL/uL (ref 4.22–5.81)

## 2013-01-10 LAB — MRSA PCR SCREENING: MRSA by PCR: NEGATIVE

## 2013-01-10 LAB — TROPONIN I: Troponin I: <0.3 ng/mL (ref <0.30)

## 2013-01-10 LAB — PHOSPHORUS: Phosphorus: 3.1 mg/dL (ref 2.3–4.6)

## 2013-01-10 MED ORDER — SODIUM CHLORIDE 0.9 % IV SOLN
0.0000 ug/h | INTRAVENOUS | Status: DC
  Administered 2013-01-11: 20 ug/h via INTRAVENOUS
  Administered 2013-01-11 (×2): 50 ug/h via INTRAVENOUS
  Administered 2013-01-12: 100 ug/h via INTRAVENOUS
  Administered 2013-01-13: 150 ug/h via INTRAVENOUS
  Administered 2013-01-14: 100 ug/h via INTRAVENOUS
  Filled 2013-01-10 (×5): qty 50

## 2013-01-10 MED ORDER — FENTANYL BOLUS VIA INFUSION
50.0000 ug | INTRAVENOUS | Status: DC | PRN
  Administered 2013-01-11 (×2): 100 ug via INTRAVENOUS
  Administered 2013-01-11 (×2): 50 ug via INTRAVENOUS
  Administered 2013-01-11: 100 ug via INTRAVENOUS
  Filled 2013-01-10: qty 100

## 2013-01-10 MED ORDER — SUCCINYLCHOLINE CHLORIDE 20 MG/ML IJ SOLN
INTRAMUSCULAR | Status: AC
  Filled 2013-01-10: qty 1

## 2013-01-10 MED ORDER — BIOTENE DRY MOUTH MT LIQD
15.0000 mL | Freq: Four times a day (QID) | OROMUCOSAL | Status: DC
  Administered 2013-01-11 – 2013-01-19 (×34): 15 mL via OROMUCOSAL

## 2013-01-10 MED ORDER — METHYLPREDNISOLONE SODIUM SUCC 40 MG IJ SOLR
40.0000 mg | Freq: Four times a day (QID) | INTRAMUSCULAR | Status: DC
  Administered 2013-01-11 – 2013-01-12 (×6): 40 mg via INTRAVENOUS
  Filled 2013-01-10 (×10): qty 1

## 2013-01-10 MED ORDER — IPRATROPIUM BROMIDE 0.02 % IN SOLN
0.5000 mg | Freq: Four times a day (QID) | RESPIRATORY_TRACT | Status: DC
  Administered 2013-01-11 – 2013-01-14 (×14): 0.5 mg via RESPIRATORY_TRACT
  Filled 2013-01-10 (×14): qty 2.5

## 2013-01-10 MED ORDER — CHLORHEXIDINE GLUCONATE 0.12 % MT SOLN
OROMUCOSAL | Status: AC
  Administered 2013-01-10: 15 mL via OROMUCOSAL
  Filled 2013-01-10: qty 15

## 2013-01-10 MED ORDER — FENTANYL CITRATE 0.05 MG/ML IJ SOLN: 50.0000 ug | Freq: Once | INTRAMUSCULAR | Status: AC

## 2013-01-10 MED ORDER — PROPOFOL 10 MG/ML IV EMUL
0.0000 ug/kg/min | INTRAVENOUS | Status: DC
  Administered 2013-01-11: 20 ug/kg/min via INTRAVENOUS
  Administered 2013-01-11: 10 ug/kg/min via INTRAVENOUS
  Administered 2013-01-11 – 2013-01-12 (×2): 20 ug/kg/min via INTRAVENOUS
  Administered 2013-01-12: 30 ug/kg/min via INTRAVENOUS
  Administered 2013-01-13: 25 ug/kg/min via INTRAVENOUS
  Administered 2013-01-13: 30 ug/kg/min via INTRAVENOUS
  Administered 2013-01-13: 10 ug/kg/min via INTRAVENOUS
  Administered 2013-01-13: 30 ug/kg/min via INTRAVENOUS
  Administered 2013-01-14: 25 ug/kg/min via INTRAVENOUS
  Administered 2013-01-14: 20 ug/kg/min via INTRAVENOUS
  Administered 2013-01-15: 30 ug/kg/min via INTRAVENOUS
  Filled 2013-01-10 (×15): qty 100

## 2013-01-10 MED ORDER — LIDOCAINE HCL (CARDIAC) 20 MG/ML IV SOLN
INTRAVENOUS | Status: AC
  Filled 2013-01-10: qty 5

## 2013-01-10 MED ORDER — LORAZEPAM 2 MG/ML IJ SOLN
0.5000 mg | Freq: Once | INTRAMUSCULAR | Status: AC
Start: 2013-01-10 — End: 2013-01-10
  Administered 2013-01-10: 0.5 mg via INTRAVENOUS
  Filled 2013-01-10: qty 1

## 2013-01-10 MED ORDER — MIDAZOLAM HCL 2 MG/2ML IJ SOLN
2.0000 mg | INTRAMUSCULAR | Status: DC | PRN
  Administered 2013-01-10 – 2013-01-11 (×2): 2 mg via INTRAVENOUS
  Filled 2013-01-10 (×2): qty 2

## 2013-01-10 MED ORDER — ETOMIDATE 2 MG/ML IV SOLN
INTRAVENOUS | Status: AC
  Filled 2013-01-10: qty 20

## 2013-01-10 MED ORDER — ROCURONIUM BROMIDE 50 MG/5ML IV SOLN
INTRAVENOUS | Status: AC
  Filled 2013-01-10: qty 2

## 2013-01-10 MED ORDER — FOLIC ACID 1 MG PO TABS
1.0000 mg | ORAL_TABLET | Freq: Every day | ORAL | Status: DC
  Administered 2013-01-10: 1 mg via ORAL
  Filled 2013-01-10: qty 1

## 2013-01-10 MED ORDER — PHENOL 1.4 % MT LIQD
1.0000 | OROMUCOSAL | Status: DC | PRN
  Filled 2013-01-10: qty 177

## 2013-01-10 MED ORDER — MIDAZOLAM HCL 2 MG/2ML IJ SOLN: 2.0000 mg | INTRAMUSCULAR | Status: DC | PRN

## 2013-01-10 MED ORDER — ALBUTEROL SULFATE (5 MG/ML) 0.5% IN NEBU
2.5000 mg | INHALATION_SOLUTION | Freq: Four times a day (QID) | RESPIRATORY_TRACT | Status: DC
Start: 2013-01-11 — End: 2013-01-14
  Administered 2013-01-11 – 2013-01-14 (×14): 2.5 mg via RESPIRATORY_TRACT
  Filled 2013-01-10 (×13): qty 0.5

## 2013-01-10 MED ORDER — CHLORHEXIDINE GLUCONATE 0.12 % MT SOLN
15.0000 mL | Freq: Two times a day (BID) | OROMUCOSAL | Status: DC
  Administered 2013-01-10 – 2013-01-19 (×19): 15 mL via OROMUCOSAL
  Filled 2013-01-10 (×19): qty 15

## 2013-01-10 MED ORDER — ALBUTEROL (5 MG/ML) CONTINUOUS INHALATION SOLN
15.0000 mg/h | INHALATION_SOLUTION | RESPIRATORY_TRACT | Status: AC
  Administered 2013-01-10: 15 mg/h via RESPIRATORY_TRACT
  Filled 2013-01-10: qty 20

## 2013-01-10 MED ORDER — VITAMIN B-1 100 MG PO TABS
100.0000 mg | ORAL_TABLET | Freq: Every day | ORAL | Status: DC
  Administered 2013-01-10: 11:00:00 100 mg via ORAL
  Filled 2013-01-10: qty 1

## 2013-01-10 MED ORDER — ALBUTEROL SULFATE (5 MG/ML) 0.5% IN NEBU
2.5000 mg | INHALATION_SOLUTION | RESPIRATORY_TRACT | Status: DC
  Administered 2013-01-10 (×4): 2.5 mg via RESPIRATORY_TRACT
  Filled 2013-01-10 (×4): qty 0.5

## 2013-01-10 NOTE — Progress Notes (Signed)
Called to patient room for respiratory distress. Upon arrival, tachycardia and tachypnea noted. Poor air movement with diffuse expiratory wheezing on exam. SpO2 95-97 on 3L nasal canula. He does have a strong, congested cough with no production. RR 28-32 per minute. Instructed purse lip breathing technique. Increased WOB continues. Everett Graff, NP with [facility] services called. RT recommended 15mg  albuterol CAT. Orders received. Called central monitoring tech to explain that some variation in heart rate may be noted. RN instructed to call RT for extreme tachycardia, arrhythmia, or worsening SOB. CAT initiated and patient is tolerating well at this time with stable VS. RT to continue to monitor.

## 2013-01-10 NOTE — Progress Notes (Signed)
This shift called to pt room w/ complaint of difficulty breathing. Pt found after just using urinal to appear flushed, diaphoretic and having labored breathing. On 3L satting at 95-97%. Encouraged pursed lip breathing, and called respiratory for further evaluation. Will continue to monitor patient.

## 2013-01-10 NOTE — Progress Notes (Signed)
Pt continuing to have difficulty breathing with decrease in sats trending 79-80%, per Tabitha, respiratory. Attending, T. Claiborne Billings notified of need for 1 hr nebulizer treatment. New orders received. Will advise of how pt tolerating treatment and if runs tachy > 140 as advised per RRT and attending.

## 2013-01-10 NOTE — Progress Notes (Signed)
[facility] PROGRESS NOTE  Taylor [name] JWJ:[id number] DOB: 05/07/1955 DOA: 01/09/2013 PCP: Reather Converse, PA-C  Assessment/Plan: Acute Hypoxemic Respiratory Failure -2/2 COPD with acute exacerbation. -Unclear if source of respiratory infection. -Titrate oxygen as tolerated. -See below for details.  COPD with Acute Exacerbation -Continue nebs/steroids. -Flu PCR pending. -Presumptive levaquin.  ETOH Abuse -Thiamine/folate. -No signs of active withdrawals at present.  Tobacco Abuse -Counseled on cessation.  Code Status: Full Code Family Communication: Patient only  Disposition Plan: To floor today. Anticipate DC home in 2-3 days.   Consultants:  None   Antibiotics:  Levaquin   Subjective: Feels better. Still achy.  Objective: Filed Vitals:   01/10/13 0400 01/10/13 0600 01/10/13 0800 01/10/13 0808  BP: 122/71 115/77 108/54   Pulse: 89 85 82   Temp: 99.5 F (37.5 C)     TempSrc: Oral     Resp: 22 24 21    Height:      Weight:      SpO2: 97% 97% 96% 96%    Intake/Output Summary (Last 24 hours) at 01/10/13 0913 Last data filed at 01/10/13 0800  Gross per 24 hour  Intake 1813.33 ml  Output    400 ml  Net 1413.33 ml   Filed Weights   01/09/13 2238  Weight: 83.3 kg (183 lb 10.3 oz)    Exam:   General:  AA Ox3  Cardiovascular: RRR  Respiratory: Mild exp wheezes  Abdomen: S/NT/ND/+BS  Extremities: no C/C/E   Neurologic:  Intact and non-focal.  Data Reviewed: Basic Metabolic Panel:  Recent Labs Lab 01/09/13 1520 01/10/13 0015  NA 131* 130*  K 4.2 3.9  CL 94* 93*  CO2 26 24  GLUCOSE 127* 291*  BUN 14 21  CREATININE 1.03 1.21  CALCIUM 8.8 8.7  MG  --  2.1  PHOS  --  3.1   Liver Function Tests:  Recent Labs Lab 01/09/13 1520 01/10/13 0015  AST 27 26  ALT 17 18  ALKPHOS 82 70  BILITOT 0.5 0.4  PROT 7.2 6.8  ALBUMIN 4.0 3.6   No results found for this basename: LIPASE, AMYLASE,  in the last 168 hours No  results found for this basename: AMMONIA,  in the last 168 hours CBC:  Recent Labs Lab 01/09/13 1520 01/10/13 0015  WBC 6.3 4.8  NEUTROABS 4.2  --   HGB 17.1* 15.4  HCT 48.1 43.9  MCV 95.4 95.0  PLT 132* 120*   Cardiac Enzymes:  Recent Labs Lab 01/09/13 1520 01/10/13 0015 01/10/13 0651  TROPONINI <0.30 <0.30 <0.30   BNP (last 3 results)  Recent Labs  09/09/12 0905  PROBNP 26.6   CBG: No results found for this basename: GLUCAP,  in the last 168 hours  No results found for this or any previous visit (from the past 240 hour(s)).   Studies: Dg Chest Port 1 View  01/09/2013   CLINICAL DATA:  Shortness of breath, cough.  EXAM: PORTABLE CHEST - 1 VIEW  COMPARISON:  09/09/2012  FINDINGS: There is hyperinflation of the lungs compatible with COPD. Trace left pleural effusion. No confluent airspace opacities. Heart is normal size. Old healed right rib fractures.  IMPRESSION: COPD.  Small left pleural effusion.   Electronically Signed   By: Charlett Nose M.D.   On: 01/09/2013 16:07    Scheduled Meds: . albuterol  2.5 mg Nebulization Q4H  . ARIPiprazole  2 mg Oral Daily  . clonazePAM  0.5 mg Oral Daily  . docusate sodium  100 mg  Oral BID  . enoxaparin (LOVENOX) injection  40 mg Subcutaneous QHS  . escitalopram  20 mg Oral Daily  . guaiFENesin  600 mg Oral BID  . ipratropium  0.5 mg Nebulization QID  . levofloxacin (LEVAQUIN) IV  750 mg Intravenous Q24H  . methylPREDNISolone (SOLU-MEDROL) injection  60 mg Intravenous Q12H  . mometasone-formoterol  2 puff Inhalation BID  . nicotine  21 mg Transdermal Daily  . pantoprazole (PROTONIX) IV  40 mg Intravenous QHS  . sodium chloride  3 mL Intravenous Q12H   Continuous Infusions: . sodium chloride 100 mL/hr at 01/10/13 0022  . albuterol 10 mg/hr (01/09/13 2220)    Principal Problem:   Acute respiratory failure with hypoxia Active Problems:   COPD with exacerbation   Alcohol abuse   COPD exacerbation   Chest  pain    Time spent: 35 minutes. Greater than 50% of this time was spent in direct contact with the patient coordinating care.    Chaya Jan  [facility] Pager [phone]  If 7PM-7AM, please contact night-coverage at [email or web address], password [facility] 01/10/2013, 9:13 AM  LOS: 1 day

## 2013-01-10 NOTE — Progress Notes (Signed)
Spoke with patient, states breathing much improved--vitals stable, no need for BIPAP at this time. RT will continue to monitor. RN made aware and is to clarify order status to PRN.

## 2013-01-10 NOTE — Progress Notes (Signed)
Pt not tolerating treatment, approx 25 minutes into treatment pt states he cannot breathe and treatment not working for him, RRT at the bedside. Attending notified and xferred to ICU RM 1223 for BIPAP

## 2013-01-10 NOTE — Progress Notes (Signed)
[facility] progress note. Chief complaint. Dyspnea, hypoxia. History of present illness. This 57 year old male in hospital with acute hypoxic respiratory failure secondary to COPD with acute exacerbation. Patient was being treated with nebs, steroids, and presumptive Levaquin. He was noted by nursing staff to become increasingly short of breath with desaturation. Nebulizer treatment was initiated but this had no positive effect. Patient was transitioned to step down and initiated on BiPAP therapy. Post BiPAP arterial blood gas was obtained indicating pH 7.199, PCO2 68.3, PO2 69.7, bicarbonate 25.6. I was felt that the patient would require intubation and respiratory therapy/anesthesiology came to the bedside and accomplished tube placement without incident. Vital signs. Temperature 98.4, pulse 126, respiration 36, blood pressure 127/93. General appearance. Well-developed middle-aged male Cardiac. Tachycardic.  Lungs. Diminished absent breath sounds midlung to base bilaterally Abdomen. Soft with positive bowel sounds. Impression/plan Problem #1 hypoxic, hypercarbic respiratory failure. PH 7.199. Patient is in process of being intubated initiating a mechanical ventilation. Patient will have a critical care consult as soon as possible this evening and will now be managed under critical.care service.

## 2013-01-10 NOTE — Consult Note (Signed)
Name: Taylor [name] MRN: [id number] DOB: [birth year]    ADMISSION DATE:  01/09/2013 CONSULTATION DATE:  01/10/2013  REFERRING MD :  Everett Graff  CHIEF COMPLAINT:  Can't breath  BRIEF PATIENT DESCRIPTION:  57 yo male smoker admit with AECOPD.  Developed VDRF 12/21 and PCCM assumed care.  Followed by Dr. Sherene Sires in pulmonary office for GOLD IV COPD with emphysema.  SIGNIFICANT EVENTS: 12/20 Admit to SDU 12/21 VDRF, PCCM assumed care  STUDIES:  PFT's 12/15/2012 >> FEV1 0.89 (24%) with Ratio 38 p am saba and DLCO 44% with 68  Echo 12/21 >> EF 50 to 55%, mild LVH  LINES / TUBES: ETT 12/22 >>   CULTURES: Influenza PCR 12/20 >> negative Sputum 12/22 >>   ANTIBIOTICS: Levaquin 12/20 >>  Tamiflu 12/22 >>   HISTORY OF PRESENT ILLNESS:   Pt intubated and sedated.  Therefore medical history obtained from review of medical record.  57 yo male smoker presented with 3 days of dyspnea, fever, and productive cough.  He also c/o generalized aches and chest pain with chest tightness.  He reported that his neighbor had similar symptoms.  He reported binge drinking of beer on weekends.  He was admitted to SDU by hospitalist team.  He was started on nebulizer therapy, antibiotics, and solumedrol.  He developed progressive respiratory symptoms and increased WOB.  He was tried on NIPPV, but developed progressive respiratory acidosis.  As a result he required intubation by anesthesia and mechanical ventilation.  PCCM was asked to assume care.  Unsure if pt has received influenza or pneumococcal vaccination.  PAST MEDICAL HISTORY :  Past Medical History  Diagnosis Date  . Inguinal hernia     RIGHT- PAINFUL  . Depression     Bipolar disorder  . Hemorrhoids   . Full dentures   . Rib fractures     hx of  . Bipolar 1 disorder 02/05/2012  . Shortness of breath   . COPD (chronic obstructive pulmonary disease)     SMOKER  . Cancer 04/11/11    adenocarcinoma of colon, 7/19 nodes  pos.FINISHED CHEMO/DR. SHERRILL  . Colon cancer 04/11/2011   Past Surgical History  Procedure Laterality Date  . Colostomy revision  04/11/2011    Procedure: COLON RESECTION SIGMOID;  Surgeon: Velora Heckler, MD;  Location: [facility];  Service: General;  Laterality: N/A;  low anterior colon resection   . Colon surgery  04/11/11    Sigmoid colectomy  . Portacath placement    . Portacath placement  05/02/2011    Procedure: INSERTION PORT-A-CATH;  Surgeon: Velora Heckler, MD;  Location: [facility];  Service: General;  Laterality: N/A;  . Inguinal hernia repair Right 05/01/2012    Procedure: HERNIA REPAIR INGUINAL ADULT;  Surgeon: Velora Heckler, MD;  Location: [facility];  Service: General;  Laterality: Right;  . Insertion of mesh Right 05/01/2012    Procedure: INSERTION OF MESH;  Surgeon: Velora Heckler, MD;  Location: [facility];  Service: General;  Laterality: Right;  . Port-a-cath removal Left 05/01/2012    Procedure: REMOVAL Infusion Port;  Surgeon: Velora Heckler, MD;  Location: [facility];  Service: General;  Laterality: Left;   Prior to Admission medications   Medication Sig Start Date End Date Taking? Authorizing Provider  ARIPiprazole (ABILIFY) 2 MG tablet Take 2 mg by mouth daily.   Yes Historical Provider, MD  clonazePAM (KLONOPIN) 0.5 MG tablet Take 0.5 mg by mouth every morning.   Yes Historical Provider,  MD  escitalopram (LEXAPRO) 20 MG tablet Take 20 mg by mouth every morning.    Yes Historical Provider, MD  mometasone-formoterol (DULERA) 200-5 MCG/ACT AERO Take 2 puffs first thing in am and then another 2 puffs about 12 hours later. 12/15/12  Yes Nyoka Cowden, MD  tiotropium [facility] HANDIHALER) 18 MCG inhalation capsule One capsule each am on wakening 12/15/12  Yes Nyoka Cowden, MD   Allergies  Allergen Reactions  . Codeine Nausea And Vomiting    FAMILY HISTORY:  Family History  Problem Relation Age of Onset  . Heart disease Father   . Lung cancer Maternal Uncle     smoked   SOCIAL  HISTORY:  reports that he has been smoking Cigarettes.  He has a 19 pack-year smoking history. He has quit using smokeless tobacco. He reports that he drinks alcohol. He reports that he does not use illicit drugs.  REVIEW OF SYSTEMS:   Unable to obtain.  SUBJECTIVE:   VITAL SIGNS: Temp:  [98.1 F (36.7 C)-99.5 F (37.5 C)] 98.4 F (36.9 C) (12/21 2152) Pulse Rate:  [81-127] 126 (12/21 2212) Resp:  [20-38] 20 (12/21 2212) BP: (103-176)/(54-104) 127/93 mmHg (12/21 2212) SpO2:  [95 %-100 %] 100 % (12/21 2212) FiO2 (%):  [100 %] 100 % (12/21 2158) HEMODYNAMICS:   VENTILATOR SETTINGS: Vent Mode:  [-]  FiO2 (%):  [100 %] 100 % PEEP:  [6 cmH20] 6 cmH20 Pressure Support:  [8 cmH20] 8 cmH20 INTAKE / OUTPUT: Intake/Output     12/21 0701 - 12/22 0700   P.O. 480   I.V. (mL/kg) 200 (2.4)   Total Intake(mL/kg) 680 (8.2)   Urine (mL/kg/hr) 600 (0.3)   Total Output 600   Net +80         PHYSICAL EXAMINATION: General:  Intubated. Sedated. Appears stated age.  Neuro:  Sedated. Moves all 4 spontaneously.  HEENT:  Normal x for edentulous and presence of ET tube and OG tube Cardiovascular:  PMI non displaced. RRR no obvious murmurs, rubs or gallops Lungs: Decreased BS throughout. Mildly bronchitic. No wheezes Abdomen:  Soft. Nontender. Well-healed low-midline surgical scar. Decrease bowel sounds Extremities: Warm. Well perfused. No edema, cyanosis or clubbing    Musculoskeletal:  Normal tone and bulk. Skin:  No rash. Diaphoretic.   ECG: (12/21 7a)  NSR 79 No ST-T wave abnormalities.    LABS:  CBC  Recent Labs Lab 01/09/13 1520 01/10/13 0015  WBC 6.3 4.8  HGB 17.1* 15.4  HCT 48.1 43.9  PLT 132* 120*   Coag's No results found for this basename: APTT, INR,  in the last 168 hours BMET  Recent Labs Lab 01/09/13 1520 01/10/13 0015  NA 131* 130*  K 4.2 3.9  CL 94* 93*  CO2 26 24  BUN 14 21  CREATININE 1.03 1.21  GLUCOSE 127* 291*   Electrolytes  Recent Labs Lab  01/09/13 1520 01/10/13 0015  CALCIUM 8.8 8.7  MG  --  2.1  PHOS  --  3.1   Sepsis Markers No results found for this basename: LATICACIDVEN, PROCALCITON, O2SATVEN,  in the last 168 hours ABG  Recent Labs Lab 01/09/13 1524 01/09/13 2210 01/10/13 2300  PHART 7.391 7.382 7.199*  PCO2ART 43.7 39.6 68.3*  PO2ART 145.0* 103.0* 69.7*   Liver Enzymes  Recent Labs Lab 01/09/13 1520 01/10/13 0015  AST 27 26  ALT 17 18  ALKPHOS 82 70  BILITOT 0.5 0.4  ALBUMIN 4.0 3.6   Cardiac Enzymes  Recent Labs Lab  01/10/13 0015 01/10/13 0651 01/10/13 1203  TROPONINI <0.30 <0.30 <0.30   Glucose No results found for this basename: GLUCAP,  in the last 168 hours  Imaging Dg Chest Port 1 View  01/09/2013   CLINICAL DATA:  Shortness of breath, cough.  EXAM: PORTABLE CHEST - 1 VIEW  COMPARISON:  09/09/2012  FINDINGS: There is hyperinflation of the lungs compatible with COPD. Trace left pleural effusion. No confluent airspace opacities. Heart is normal size. Old healed right rib fractures.  IMPRESSION: COPD.  Small left pleural effusion.   Electronically Signed   By: Charlett Nose M.D.   On: 01/09/2013 16:07   ASSESSMENT / PLAN:  PULMONARY A: Acute hypoxic/hypercapnic respiratory failure 2nd to AECOPD. Possible influenza. Continued tobacco abuse. P:   -full vent support >> adjust settings to avoid PEEPi -adjust solumedrol regimen -continue scheduled BD's -f/u CXR and ABG -decrease nicotine patch to 7 mg  CARDIOVASCULAR A:  Sinus tachycardia >> likely related to acute respiratory distress. Chest pain on admission >> cardiac enzyme negative, ECG and echo unremarkable. P:  -monitor hemodynamics -maintain hydration -consider outpatient stress testing  RENAL A:   Mild hyponatremia. P:   -monitor electrolytes -continue NS IV fluid -monitor renal fx, urine outpt -place condom catheter  GASTROINTESTINAL A:   Nutrition. P:   -NPO -add tube feeds if not able to extubate  soon -protonix for SUP  HEMATOLOGIC A:   Mild thrombocytopenia >> chronic, secondary to chemotherapy for hx of colon cancer. P:  -f/u CBC -continue lovenox for DVT prevention  INFECTIOUS A:   AECOPD. High clinical suspicion for influenza even with negative influenza PCR. P:   -continue levaquin -add tamiflu 12/22 -continue droplet isolation until clinically improved, and no longer febrile -check sputum Cx  ENDOCRINE A:   Steroid induced hyperglycemia. P:   -SSI while on solumedrol  NEUROLOGIC A:   Hx of Bipolar disorder, binge alcohol consumption >> on chronic benzo's as outpt. P:   -sedation protocol with propofol, fentanyl and prn versed >> goal RASS -2 until respiratory status more stable -continue abilify, lexapro -continue thiamine, folic acid  Discussed with and note done in conjunction with Dr. Craige Cotta.  Daniel Bensimhon,MD 1:10 AM P/CCCM Service

## 2013-01-10 NOTE — Progress Notes (Signed)
Patient on CAT for approximately 10 minutes and had worsening dyspnea with respiratory distress. He was pulled at the mask and begging for more oxygen. SpO2 dropped to 87% on 6L nasal canula. Placed on 1.0 FiO2 via NRB mask for sats of 94%. Distress remains constant. Everett Graff, NP notified. Orders received for SDU. Transferred to room 1223 on NRB and placed on NIPPV at documented settings. Remaining portion of CAT placed in line. Lenny Pastel, NP to bedside. Orders received for ABG post CAT and to call results to him. Patient tolerating NIPPV well at this time. WOB decreased and he agrees to feeling better at this time.

## 2013-01-10 NOTE — Progress Notes (Signed)
  Echocardiogram 2D Echocardiogram has been performed.  Estelle Grumbles 01/10/2013, 10:19 AM

## 2013-01-11 ENCOUNTER — Inpatient Hospital Stay (HOSPITAL_COMMUNITY): Payer: Medicaid Other

## 2013-01-11 DIAGNOSIS — F17200 Nicotine dependence, unspecified, uncomplicated: Secondary | ICD-10-CM

## 2013-01-11 DIAGNOSIS — J9600 Acute respiratory failure, unspecified whether with hypoxia or hypercapnia: Principal | ICD-10-CM

## 2013-01-11 DIAGNOSIS — J441 Chronic obstructive pulmonary disease with (acute) exacerbation: Secondary | ICD-10-CM

## 2013-01-11 DIAGNOSIS — R0902 Hypoxemia: Secondary | ICD-10-CM

## 2013-01-11 LAB — CBC
HCT: 45.4 % (ref 39.0–52.0)
MCH: 33.4 pg (ref 26.0–34.0)
MCHC: 33.7 g/dL (ref 30.0–36.0)
MCV: 99.1 fL (ref 78.0–100.0)
Platelets: 137 10*3/uL — ABNORMAL LOW (ref 150–400)
WBC: 15.9 10*3/uL — ABNORMAL HIGH (ref 4.0–10.5)

## 2013-01-11 LAB — BASIC METABOLIC PANEL
BUN: 28 mg/dL — ABNORMAL HIGH (ref 6–23)
CO2: 23 mEq/L (ref 19–32)
Chloride: 99 mEq/L (ref 96–112)
Creatinine, Ser: 1.4 mg/dL — ABNORMAL HIGH (ref 0.50–1.35)
GFR calc non Af Amer: 54 mL/min — ABNORMAL LOW (ref >90)
Potassium: 5.2 mEq/L — ABNORMAL HIGH (ref 3.5–5.1)
Sodium: 135 mEq/L (ref 135–145)

## 2013-01-11 LAB — URINE CULTURE: Colony Count: 55000

## 2013-01-11 LAB — TROPONIN I
Troponin I: <0.3 ng/mL (ref <0.30)
Troponin I: <0.3 ng/mL (ref <0.30)

## 2013-01-11 LAB — BLOOD GAS, ARTERIAL
Acid-base deficit: 4.7 mmol/L — ABNORMAL HIGH (ref 0.0–2.0)
Bicarbonate: 24.7 mEq/L — ABNORMAL HIGH (ref 20.0–24.0)
FIO2: 0.5 %
O2 Saturation: 98.7 %
PEEP: 5 cmH2O
Patient temperature: 98.6
pCO2 arterial: 65.3 mmHg (ref 35.0–45.0)
pO2, Arterial: 142 mmHg — ABNORMAL HIGH (ref 80.0–100.0)

## 2013-01-11 LAB — TRIGLYCERIDES: Triglycerides: 98 mg/dL (ref <150)

## 2013-01-11 MED ORDER — PANTOPRAZOLE SODIUM 40 MG PO PACK
40.0000 mg | PACK | Freq: Every day | ORAL | Status: DC
  Administered 2013-01-11 – 2013-01-16 (×7): 40 mg
  Filled 2013-01-11 (×10): qty 20

## 2013-01-11 MED ORDER — PROPOFOL 10 MG/ML IV BOLUS
INTRAVENOUS | Status: AC
  Filled 2013-01-11: qty 20

## 2013-01-11 MED ORDER — SUCCINYLCHOLINE CHLORIDE 20 MG/ML IJ SOLN
INTRAMUSCULAR | Status: DC | PRN
  Administered 2013-01-10: 100 mg via INTRAVENOUS

## 2013-01-11 MED ORDER — OSELTAMIVIR PHOSPHATE 6 MG/ML PO SUSR
75.0000 mg | Freq: Two times a day (BID) | ORAL | Status: DC
  Administered 2013-01-11 – 2013-01-14 (×9): 75 mg
  Filled 2013-01-11 (×12): qty 12.5

## 2013-01-11 MED ORDER — SUCCINYLCHOLINE CHLORIDE 20 MG/ML IJ SOLN
INTRAMUSCULAR | Status: AC
  Filled 2013-01-11: qty 1

## 2013-01-11 MED ORDER — ARIPIPRAZOLE 2 MG PO TABS
2.0000 mg | ORAL_TABLET | Freq: Every day | ORAL | Status: DC
  Administered 2013-01-11 – 2013-01-17 (×7): 2 mg
  Filled 2013-01-11 (×7): qty 1

## 2013-01-11 MED ORDER — SODIUM CHLORIDE 0.9 % IV SOLN
INTRAVENOUS | Status: DC
  Administered 2013-01-11 – 2013-01-13 (×3): via INTRAVENOUS

## 2013-01-11 MED ORDER — PROPOFOL 10 MG/ML IV BOLUS
INTRAVENOUS | Status: DC | PRN
  Administered 2013-01-10: 200 mg via INTRAVENOUS

## 2013-01-11 MED ORDER — FOLIC ACID 1 MG PO TABS
1.0000 mg | ORAL_TABLET | Freq: Every day | ORAL | Status: DC
  Administered 2013-01-11 – 2013-01-17 (×7): 1 mg
  Filled 2013-01-11 (×7): qty 1

## 2013-01-11 MED ORDER — NICOTINE 7 MG/24HR TD PT24
7.0000 mg | MEDICATED_PATCH | Freq: Every day | TRANSDERMAL | Status: DC
  Administered 2013-01-11 – 2013-01-12 (×2): 7 mg via TRANSDERMAL
  Filled 2013-01-11 (×3): qty 1

## 2013-01-11 MED ORDER — ESCITALOPRAM OXALATE 20 MG PO TABS
20.0000 mg | ORAL_TABLET | Freq: Every day | ORAL | Status: DC
  Administered 2013-01-11 – 2013-01-17 (×7): 20 mg
  Filled 2013-01-11 (×7): qty 1

## 2013-01-11 MED ORDER — VITAMIN B-1 100 MG PO TABS
100.0000 mg | ORAL_TABLET | Freq: Every day | ORAL | Status: DC
  Administered 2013-01-11 – 2013-01-17 (×7): 100 mg
  Filled 2013-01-11 (×7): qty 1

## 2013-01-11 NOTE — Progress Notes (Signed)
INITIAL NUTRITION ASSESSMENT  DOCUMENTATION CODES Per approved criteria  -Not Applicable   INTERVENTION: - If pt remains intubated in the next 24-48 hours, recommend initiation of enteral nutrition of Vital AF 1.2 start at 53ml/hr and increase by 10ml every 4 hours to goal of 29ml/hr. Goal rate will provide 1728 calories, 108g protein, free water. Goal rate TF plus current Propofol rate will provide 1995 calories and meet 102% estimated calorie needs, 100% estimated protein needs.  - Will continue to monitor   NUTRITION DIAGNOSIS: Inadequate oral intake related to inability to eat as evidenced by NPO.   Goal: Initiation of enteral nutrition with goal to meet >90% of estimated nutritional needs  Monitor:  Weights, labs, TF initiation, vent status   Reason for Assessment: Ventilated  57 y.o. male  Admitting Dx: Acute respiratory failure with hypoxia  ASSESSMENT: Admitted with shortness of breath x 3 days. Hx of COPD and colon CA s/p chemoradiation. Pt with full dentures. Pt reported on admission that he drinks on weekends about 6-12 beers. Last drink was reported to be 5 days ago. Pt found to have acute COPD exacerbation. Yesterday evening pt developed progressive respiratory acidosis requiring intubation.   Patient is currently intubated on ventilator support.  MV: 9.8 L/min Temp (24hrs), Avg:98.6 F (37 C), Min:98.1 F (36.7 C), Max:99.1 F (37.3 C)  Propofol: 10.1 ml/hr which provides 267 calories   Potassium elevated    Height: Ht Readings from Last 1 Encounters:  01/09/13 5\' 11"  (1.803 m)    Weight: Wt Readings from Last 1 Encounters:  01/11/13 188 lb 4.4 oz (85.4 kg)    Ideal Body Weight: 172 lb  % Ideal Body Weight: 109%  Wt Readings from Last 10 Encounters:  01/11/13 188 lb 4.4 oz (85.4 kg)  12/15/12 193 lb (87.544 kg)  09/17/12 194 lb 6.4 oz (88.179 kg)  05/20/12 189 lb 3.2 oz (85.821 kg)  05/12/12 190 lb 3.2 oz (86.274 kg)  04/30/12 190 lb  (86.183 kg)  04/29/12 190 lb 12.8 oz (86.546 kg)  03/31/12 194 lb (87.998 kg)  02/06/12 185 lb 13.6 oz (84.3 kg)  12/02/11 183 lb 3.2 oz (83.099 kg)    Usual Body Weight: 193 lb last month  % Usual Body Weight: 97%  BMI:  Body mass index is 26.27 kg/(m^2).  Estimated Nutritional Needs: Kcal: 1954 Protein: 100-120g Fluid: > 1.9L/day   Skin: WNL  Diet Order: NPO  EDUCATION NEEDS: -No education needs identified at this time   Intake/Output Summary (Last 24 hours) at 01/11/13 0918 Last data filed at 01/11/13 0857  Gross per 24 hour  Intake 765.66 ml  Output    850 ml  Net -84.34 ml    Last BM: 12/20  Labs:   Recent Labs Lab 01/09/13 1520 01/10/13 0015 01/11/13 0228  NA 131* 130* 135  K 4.2 3.9 5.2*  CL 94* 93* 99  CO2 26 24 23   BUN 14 21 28*  CREATININE 1.03 1.21 1.40*  CALCIUM 8.8 8.7 8.7  MG  --  2.1  --   PHOS  --  3.1  --   GLUCOSE 127* 291* 180*    CBG (last 3)  No results found for this basename: GLUCAP,  in the last 72 hours  Scheduled Meds: . albuterol  2.5 mg Nebulization Q6H  . antiseptic oral rinse  15 mL Mouth Rinse QID  . ARIPiprazole  2 mg Per Tube Daily  . chlorhexidine  15 mL Mouth Rinse BID  .  docusate sodium  100 mg Oral BID  . enoxaparin (LOVENOX) injection  40 mg Subcutaneous QHS  . escitalopram  20 mg Per Tube Daily  . folic acid  1 mg Per Tube Daily  . ipratropium  0.5 mg Nebulization Q6H  . levofloxacin (LEVAQUIN) IV  750 mg Intravenous Q24H  . methylPREDNISolone (SOLU-MEDROL) injection  40 mg Intravenous Q6H  . nicotine  7 mg Transdermal Daily  . oseltamivir  75 mg Per Tube BID  . pantoprazole sodium  40 mg Per Tube QHS  . sodium chloride  3 mL Intravenous Q12H  . thiamine  100 mg Per Tube Daily    Continuous Infusions: . sodium chloride 50 mL/hr at 01/11/13 0033  . fentaNYL infusion INTRAVENOUS 200 mcg/hr (01/11/13 0857)  . propofol 20.208 mcg/kg/min (01/11/13 0857)    Past Medical History  Diagnosis Date  .  Inguinal hernia     RIGHT- PAINFUL  . Depression     Bipolar disorder  . Hemorrhoids   . Full dentures   . Rib fractures     hx of  . Bipolar 1 disorder 02/05/2012  . Shortness of breath   . COPD (chronic obstructive pulmonary disease)     SMOKER  . 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 Colon cancer 04/11/2011    Past Surgical History  Procedure Laterality Date  . Colostomy revision  04/11/2011    Procedure: COLON RESECTION SIGMOID;  Surgeon: Velora Heckler, MD;  Location: [facility];  Service: General;  Laterality: N/A;  low anterior colon resection   . Colon surgery  04/11/11    Sigmoid colectomy  . Portacath placement    . Portacath placement  05/02/2011    Procedure: INSERTION PORT-A-CATH;  Surgeon: Velora Heckler, MD;  Location: [facility];  Service: General;  Laterality: N/A;  . Inguinal hernia repair Right 05/01/2012    Procedure: HERNIA REPAIR INGUINAL ADULT;  Surgeon: Velora Heckler, MD;  Location: [facility];  Service: General;  Laterality: Right;  . Insertion of mesh Right 05/01/2012    Procedure: INSERTION OF MESH;  Surgeon: Velora Heckler, MD;  Location: [facility];  Service: General;  Laterality: Right;  . Port-a-cath removal Left 05/01/2012    Procedure: REMOVAL Infusion Port;  Surgeon: Velora Heckler, MD;  Location: [facility];  Service: General;  Laterality: Left;    Levon Hedger MS, RD, LDN [phone] Pager [phone] After Hours Pager

## 2013-01-11 NOTE — Plan of Care (Signed)
Problem: Phase I Progression Outcomes Goal: Sedation Protocol initiated if indicated Outcome: Progressing Fentanyl and Diprivan drips

## 2013-01-11 NOTE — Progress Notes (Signed)
CARE MANAGEMENT NOTE 01/11/2013  Patient:  Taylor [name], Taylor [name]   Account Number:  [id number]  Date Initiated:  01/11/2013  Documentation initiated by:  Brielyn Bosak  Subjective/Objective Assessment:   [t with hx of copd and current smoker-increased wob tried on nebs and bipap progressive dyspnea and was intubated.     Action/Plan:   home when stable   Anticipated DC Date:  01/14/2013   Anticipated DC Plan:  HOME/SELF CARE         Choice offered to / List presented to:             Status of service:  In process, will continue to follow Medicare Important Message given?   (If response is "NO", the following Medicare IM given date fields will be blank) Date Medicare IM given:   Date Additional Medicare IM given:    Discharge Disposition:    Per UR Regulation:  Reviewed for med. necessity/level of care/duration of stay  If discussed at Long Length of Stay Meetings, dates discussed:    Comments:  [id number]/Ashten Prats Stark Jock, BSN, [place] [phone] Chart Reviewed for discharge and hospital needs. Discharge needs at time of review:  None present will follow for needs. Review of patient progress due on [id number].

## 2013-01-11 NOTE — Progress Notes (Signed)
Inpatient Diabetes Program Recommendations  AACE/ADA: New Consensus Statement on Inpatient Glycemic Control (2013)  Target Ranges:  Prepandial:   less than 140 mg/dL      Peak postprandial:   less than 180 mg/dL (1-2 hours)      Critically ill patients:  140 - 180 mg/dL   Reason for Visit: Hyperglycemia  Results for Taylor [name], Taylor [name] (MRN [id number]) as of 01/11/2013 13:09  Ref. Range 01/10/2013 00:15 01/11/2013 02:28  Glucose Latest Range: 70-99 mg/dl 409 (H) 811 (H)   Steroid-induced hyperglycemia. On Solumedrol Q6 hours.  Inpatient Diabetes Program Recommendations Correction (SSI): Add Novolog sensitive Q4 hours  Note: Will continue to follow. Thank you. Ailene Ards, RD, LDN, CDE Inpatient Diabetes Coordinator [phone]

## 2013-01-11 NOTE — Progress Notes (Addendum)
Name: Taylor [name] MRN: [id number] DOB: 01-14-1956    ADMISSION DATE:  01/09/2013 CONSULTATION DATE:  01/10/2013  REFERRING MD :  Everett Graff  CHIEF COMPLAINT:  Can't breath  BRIEF PATIENT DESCRIPTION:  57 yo male smoker admit with AECOPD.  Developed VDRF 12/21 and PCCM assumed care.  Followed by Dr. Sherene Sires in pulmonary office for GOLD IV COPD with emphysema.  SIGNIFICANT EVENTS: 12/20 Admit to SDU 12/21 VDRF, PCCM assumed care  STUDIES:  PFT's 12/15/2012 >> FEV1 0.89 (24%) with Ratio 38 p am saba and DLCO 44% with 68  Echo 12/21 >> EF 50 to 55%, mild LVH  LINES / TUBES: ETT 12/22 >>   CULTURES: Influenza PCR 12/20 >> negative Sputum 12/22 >>   ANTIBIOTICS: Levaquin 12/20 >>  Tamiflu 12/22 >>   HISTORY OF PRESENT ILLNESS:   Pt intubated and sedated.  Therefore medical history obtained from review of medical record.  57 yo male smoker presented with 3 days of dyspnea, fever, and productive cough.  He also c/o generalized aches and chest pain with chest tightness.  He reported that his neighbor had similar symptoms.  He reported binge drinking of beer on weekends.  He was admitted to SDU by hospitalist team.  He was started on nebulizer therapy, antibiotics, and solumedrol.  He developed progressive respiratory symptoms and increased WOB.  He was tried on NIPPV, but developed progressive respiratory acidosis.  As a result he required intubation by anesthesia and mechanical ventilation.  PCCM was asked to assume care.  Unsure if pt has received influenza or pneumococcal vaccination.   SUBJECTIVE:   VITAL SIGNS: Temp:  [98.1 F (36.7 C)-99.1 F (37.3 C)] 98.8 F (37.1 C) (12/22 0751) Pulse Rate:  [81-139] 95 (12/22 0900) Resp:  [13-42] 18 (12/22 0900) BP: (83-197)/(51-127) 136/93 mmHg (12/22 0751) SpO2:  [93 %-100 %] 98 % (12/22 0900) FiO2 (%):  [40 %-100 %] 40 % (12/22 0900) Weight:  [185 lb 3 oz (84 kg)-188 lb 4.4 oz (85.4 kg)] 188 lb 4.4 oz (85.4  kg) (12/22 0400) HEMODYNAMICS:   VENTILATOR SETTINGS: Vent Mode:  [-] PRVC FiO2 (%):  [40 %-100 %] 40 % Set Rate:  [16 bmp] 16 bmp Vt Set:  [600 mL] 600 mL PEEP:  [5 cmH20-6 cmH20] 5 cmH20 Pressure Support:  [8 cmH20] 8 cmH20 Plateau Pressure:  [18 cmH20-28 cmH20] 28 cmH20 INTAKE / OUTPUT: Intake/Output     12/21 0701 - 12/22 0700 12/22 0701 - 12/23 0700   P.O. 480    I.V. (mL/kg) 695.7 (8.1) 20 (0.2)   NG/GT 100    IV Piggyback 150    Total Intake(mL/kg) 1425.7 (16.7) 20 (0.2)   Urine (mL/kg/hr) 600 (0.3)    Emesis/NG output 250 (0.1)    Total Output 850     Net +575.7 +20          PHYSICAL EXAMINATION: General:  Intubated. Sedated. Appears stated age.  Neuro:  Sedated. Moves all 4 spontaneously. Arouses and follows commands HEENT:  Normal x for edentulous and presence of ET tube and OG tube Cardiovascular:  PMI non displaced. RRR no obvious murmurs, rubs or gallops Lungs: Decreased BS throughout. Mildly bronchitic. Bronchspastic Abdomen:  Soft. Nontender. Well-healed low-midline surgical scar. Decrease bowel sounds Extremities: Warm. Well perfused. No edema, cyanosis or clubbing    Musculoskeletal:  Normal tone and bulk. Skin:  No rash. Diaphoretic.   ECG: (12/21 7a)  NSR 79 No ST-T wave abnormalities.    LABS:  CBC  Recent Labs Lab 01/09/13 1520 01/10/13 0015 01/11/13 0228  WBC 6.3 4.8 15.9*  HGB 17.1* 15.4 15.3  HCT 48.1 43.9 45.4  PLT 132* 120* 137*   Coag's No results found for this basename: APTT, INR,  in the last 168 hours BMET  Recent Labs Lab 01/09/13 1520 01/10/13 0015 01/11/13 0228  NA 131* 130* 135  K 4.2 3.9 5.2*  CL 94* 93* 99  CO2 26 24 23   BUN 14 21 28*  CREATININE 1.03 1.21 1.40*  GLUCOSE 127* 291* 180*   Electrolytes  Recent Labs Lab 01/09/13 1520 01/10/13 0015 01/11/13 0228  CALCIUM 8.8 8.7 8.7  MG  --  2.1  --   PHOS  --  3.1  --    Sepsis Markers No results found for this basename: LATICACIDVEN, PROCALCITON,  O2SATVEN,  in the last 168 hours ABG  Recent Labs Lab 01/09/13 2210 01/10/13 2300 01/11/13 0222  PHART 7.382 7.199* 7.203*  PCO2ART 39.6 68.3* 65.3*  PO2ART 103.0* 69.7* 142.0*   Liver Enzymes  Recent Labs Lab 01/09/13 1520 01/10/13 0015  AST 27 26  ALT 17 18  ALKPHOS 82 70  BILITOT 0.5 0.4  ALBUMIN 4.0 3.6   Cardiac Enzymes  Recent Labs Lab 01/10/13 0015 01/10/13 0651 01/10/13 1203  TROPONINI <0.30 <0.30 <0.30   Glucose No results found for this basename: GLUCAP,  in the last 168 hours  Imaging Dg Chest Port 1 View  01/11/2013   CLINICAL DATA:  Intubation.  EXAM: PORTABLE CHEST - 1 VIEW  COMPARISON:  01/11/2013.  01/09/2013.  FINDINGS: Endotracheal tube and NG tube in stable position. Mild cardiomegaly and pulmonary venous congestion or today's exam. No evidence of overt pulmonary edema or focal pulmonary infiltrate. No pleural effusion or pneumothorax. Old right posterior rib fractures.  IMPRESSION: 1. Stable line and tube positions. 2. Mild cardiomegaly and pulmonary venous congestion on today's exam. No evidence of overt pulmonary edema. No focal infiltrate.   Electronically Signed   By: Maisie Fus  Register   On: 01/11/2013 07:01   Dg Chest Port 1 View  01/11/2013   CLINICAL DATA:  Endotracheal intubation.  EXAM: PORTABLE CHEST - 1 VIEW  COMPARISON:  01/09/2013.  FINDINGS: Endotracheal tube tip is 88 mm from the carina, above the level of the clavicles. Enteric tube passes within the esophagus. Lungs demonstrate mild left basilar atelectasis. The cardiopericardial silhouette and mediastinal contours are within normal limits.  IMPRESSION: Interval intubation with the endotracheal tube tip 88 mm from the carina.   Electronically Signed   By: Andreas Newport M.D.   On: 01/11/2013 00:22   Dg Chest Port 1 View  01/09/2013   CLINICAL DATA:  Shortness of breath, cough.  EXAM: PORTABLE CHEST - 1 VIEW  COMPARISON:  09/09/2012  FINDINGS: There is hyperinflation of the lungs  compatible with COPD. Trace left pleural effusion. No confluent airspace opacities. Heart is normal size. Old healed right rib fractures.  IMPRESSION: COPD.  Small left pleural effusion.   Electronically Signed   By: Charlett Nose M.D.   On: 01/09/2013 16:07   ASSESSMENT / PLAN:  PULMONARY A: Acute hypoxic/hypercapnic respiratory failure 2nd to AECOPD. Possible influenza. Continued tobacco abuse. P:   - Full vent support >> adjust settings to avoid PEEPi - Adjust solumedrol regimen - Continue scheduled BD's - F/u CXR and ABG - D/C nicotine patch to 7 mg  CARDIOVASCULAR A:  Sinus tachycardia >> likely related to acute respiratory distress. Chest pain on admission >> cardiac  enzyme negative, ECG and echo unremarkable. P:  - Monitor hemodynamics - Maintain hydration - Consider outpatient stress testing - Dc nicotine patch  RENAL  Recent Labs Lab 01/09/13 1520 01/10/13 0015 01/11/13 0228  NA 131* 130* 135    A:   Mild hyponatremia. Resolved P:   - Monitor electrolytes - Continue NS IV fluid - Monitor renal fx, urine outpt - Place foley catheter  GASTROINTESTINAL A:   Nutrition. P:   - NPO - Add tube feeds if not able to extubate in AM. - Protonix for SUP  HEMATOLOGIC A:   Mild thrombocytopenia >> chronic, secondary to chemotherapy for hx of colon cancer. P:  - F/u CBC - Continue lovenox for DVT prevention  INFECTIOUS A:   AECOPD. High clinical suspicion for influenza even with negative influenza PCR. P:   - Continue levaquin - Add tamiflu 12/22 - Continue droplet isolation until clinically improved, and no longer febrile - Check sputum Cx  ENDOCRINE A:   Steroid induced hyperglycemia. P:   - SSI while on solumedrol  NEUROLOGIC A:   Hx of Bipolar disorder, binge alcohol consumption >> on chronic benzo's as outpt. P:   - Sedation protocol with propofol, fentanyl and prn versed >> goal RASS -2 until respiratory status more stable - Continue  abilify, lexapro - Continue thiamine, folic acid  Global: Not currently weanable.  Brett Canales Minor ACNP [facility] Pager [phone] till 3 pm If no answer page [phone] 01/11/2013, 9:52 AM  Maintain intubated for now, auto-PEEP and not ready for weaning.  CC time 35 min.  Patient seen and examined, agree with above note.  I dictated the care and orders written for this patient under my direction.  Alyson Reedy, MD [phone]

## 2013-01-11 NOTE — Progress Notes (Signed)
Attempted twice to call patient's sister, Junius Argyle at the phone number pt gave [phone]). Busy signal, no answer. Will keep trying.

## 2013-01-12 ENCOUNTER — Inpatient Hospital Stay (HOSPITAL_COMMUNITY): Payer: Medicaid Other

## 2013-01-12 LAB — BLOOD GAS, ARTERIAL
Acid-base deficit: 3.8 mmol/L — ABNORMAL HIGH (ref 0.0–2.0)
Acid-base deficit: 0.4 mmol/L (ref 0.0–2.0)
Drawn by: 232811
Drawn by: 232811
FIO2: 0.4 %
FIO2: 0.4 %
MECHVT: 600 mL
PEEP: 5 cmH2O
Patient temperature: 98.6
Patient temperature: 98.6
RATE: 16 resp/min
RATE: 24 resp/min
TCO2: 25.8 mmol/L (ref 0–100)
TCO2: 25.2 mmol/L (ref 0–100)
pCO2 arterial: 82.7 mmHg (ref 35.0–45.0)
pCO2 arterial: 62.5 mmHg (ref 35.0–45.0)
pH, Arterial: 7.149 — CL (ref 7.350–7.450)
pO2, Arterial: 74.9 mmHg — ABNORMAL LOW (ref 80.0–100.0)

## 2013-01-12 LAB — CBC
HCT: 40.7 % (ref 39.0–52.0)
MCV: 99.8 fL (ref 78.0–100.0)
Platelets: 141 10*3/uL — ABNORMAL LOW (ref 150–400)
RBC: 4.08 MIL/uL — ABNORMAL LOW (ref 4.22–5.81)
WBC: 10.1 10*3/uL (ref 4.0–10.5)

## 2013-01-12 LAB — BASIC METABOLIC PANEL
CO2: 27 mEq/L (ref 19–32)
Chloride: 105 mEq/L (ref 96–112)
Creatinine, Ser: 1.59 mg/dL — ABNORMAL HIGH (ref 0.50–1.35)
GFR calc Af Amer: 54 mL/min — ABNORMAL LOW (ref >90)
Potassium: 5.2 mEq/L — ABNORMAL HIGH (ref 3.5–5.1)

## 2013-01-12 LAB — MAGNESIUM: Magnesium: 2.8 mg/dL — ABNORMAL HIGH (ref 1.5–2.5)

## 2013-01-12 LAB — GLUCOSE, CAPILLARY: Glucose-Capillary: 112 mg/dL — ABNORMAL HIGH (ref 70–99)

## 2013-01-12 LAB — TROPONIN I: Troponin I: <0.3 ng/mL (ref <0.30)

## 2013-01-12 MED ORDER — MAGNESIUM SULFATE 40 MG/ML IJ SOLN
2.0000 g | Freq: Once | INTRAMUSCULAR | Status: DC
Start: 2013-01-12 — End: 2013-01-12

## 2013-01-12 MED ORDER — JEVITY 1.2 CAL PO LIQD: 1000.0000 mL | ORAL | Status: DC

## 2013-01-12 MED ORDER — METHYLPREDNISOLONE SODIUM SUCC 125 MG IJ SOLR
80.0000 mg | Freq: Three times a day (TID) | INTRAMUSCULAR | Status: DC
  Administered 2013-01-12 – 2013-01-13 (×4): 80 mg via INTRAVENOUS
  Filled 2013-01-12 (×6): qty 1.28

## 2013-01-12 MED ORDER — VITAL AF 1.2 CAL PO LIQD
1000.0000 mL | ORAL | Status: DC
  Filled 2013-01-12: qty 1000

## 2013-01-12 MED ORDER — VITAL AF 1.2 CAL PO LIQD: 1000.0000 mL | ORAL | Status: DC

## 2013-01-12 MED ORDER — VITAL AF 1.2 CAL PO LIQD
1000.0000 mL | ORAL | Status: DC
  Administered 2013-01-12 (×2): 1000 mL
  Filled 2013-01-12 (×2): qty 1000

## 2013-01-12 NOTE — Progress Notes (Addendum)
NUTRITION FOLLOW UP/CONSULT TF MANAGEMENT  Intervention:   - Will initiate Vital AF 1.2 start at 30ml/hr and increase by 10ml every 12 hours to goal of 58ml/hr. Goal rate will provide 1728 calories, 108g protein, free water. Goal rate TF plus current Propofol rate will provide 1995 calories and meet 105% estimated calorie needs, 100% estimated protein needs.  - Will order adult enteral protocol - Will continue to monitor    Nutrition Dx:   Inadequate oral intake related to inability to eat as evidenced by NPO - ongoing   Goal:   Initiation of enteral nutrition with goal to meet >90% of estimated nutritional needs - initiation will be met today with plans to advance to goal   Monitor:   Weights, labs, TF tolerance/advancement, vent status  Assessment:   Admitted with shortness of breath x 3 days. Hx of COPD and colon CA s/p chemoradiation. Pt with full dentures. Pt reported on admission that he drinks on weekends about 6-12 beers. Last drink was reported to be 5 days ago. Pt found to have acute COPD exacerbation.   12/22 - Yesterday evening pt developed progressive respiratory acidosis requiring intubation.  12/23 - Received consult for assessment, however per discussion with NP, plan is for RD to manage TF. RN contacted RD over concern for pt having some mild distention.   Patient is currently intubated on ventilator support.  MV: 8.3 L/min Temp (24hrs), Avg:98.6 F (37 C), Min:98.2 F (36.8 C), Max:99 F (37.2 C)  Propofol: 10.1 ml/hr which provides 267 calories  Potassium elevated Magnesium elevated   Height: Ht Readings from Last 1 Encounters:  01/11/13 5\' 11"  (1.803 m)    Weight Status:   Wt Readings from Last 1 Encounters:  01/11/13 188 lb 4.4 oz (85.4 kg)  Admit wt:        183 lb 10.3 oz (83.3 kg)  Net I/Os: +1.4L  Re-estimated needs:  Kcal: 1889 Protein: 100-120g Fluid: > 1.8L/day  Skin: WNL  Diet Order: NPO   Intake/Output Summary (Last 24 hours)  at 01/12/13 0955 Last data filed at 01/12/13 0400  Gross per 24 hour  Intake 1455.6 ml  Output   1380 ml  Net   75.6 ml    Last BM: 12/20   Labs:   Recent Labs Lab 01/09/13 1520 01/10/13 0015 01/11/13 0228 01/12/13 0344  NA 131* 130* 135 140  K 4.2 3.9 5.2* 5.2*  CL 94* 93* 99 105  CO2 26 24 23 27   BUN 14 21 28* 54*  CREATININE 1.03 1.21 1.40* 1.59*  CALCIUM 8.8 8.7 8.7 8.5  MG  --  2.1  --  2.8*  PHOS  --  3.1  --  3.0  GLUCOSE 127* 291* 180* 143*    CBG (last 3)  No results found for this basename: GLUCAP,  in the last 72 hours  Scheduled Meds: . albuterol  2.5 mg Nebulization Q6H  . antiseptic oral rinse  15 mL Mouth Rinse QID  . ARIPiprazole  2 mg Per Tube Daily  . chlorhexidine  15 mL Mouth Rinse BID  . docusate sodium  100 mg Oral BID  . enoxaparin (LOVENOX) injection  40 mg Subcutaneous QHS  . escitalopram  20 mg Per Tube Daily  . feeding supplement (JEVITY 1.2 CAL)  1,000 mL Per Tube Q24H  . folic acid  1 mg Per Tube Daily  . ipratropium  0.5 mg Nebulization Q6H  . levofloxacin (LEVAQUIN) IV  750 mg Intravenous Q24H  .  methylPREDNISolone (SOLU-MEDROL) injection  80 mg Intravenous Q8H  . nicotine  7 mg Transdermal Daily  . oseltamivir  75 mg Per Tube BID  . pantoprazole sodium  40 mg Per Tube QHS  . sodium chloride  3 mL Intravenous Q12H  . thiamine  100 mg Per Tube Daily    Continuous Infusions: . sodium chloride 50 mL/hr at 01/11/13 1112  . fentaNYL infusion INTRAVENOUS 100 mcg/hr (01/12/13 0400)  . propofol 20 mcg/kg/min (01/12/13 0400)     Levon Hedger MS, RD, LDN [phone] Pager [phone] After Hours Pager

## 2013-01-12 NOTE — Progress Notes (Signed)
Name: Taylor [name] MRN: [id number] DOB: 1956/01/07    ADMISSION DATE:  01/09/2013 CONSULTATION DATE:  01/10/2013  REFERRING MD :  Everett Graff  CHIEF COMPLAINT:  Can't breath  BRIEF PATIENT DESCRIPTION:  57 yo male smoker admit with AECOPD.  Developed VDRF 12/21 and PCCM assumed care.  Followed by Dr. Sherene Sires in pulmonary office for GOLD IV COPD with emphysema.  SIGNIFICANT EVENTS: 12/20 Admit to SDU 12/21 VDRF, PCCM assumed care 12-23 hypercarbic acidosis STUDIES:  PFT's 12/15/2012 >> FEV1 0.89 (24%) with Ratio 38 p am saba and DLCO 44% with 68  Echo 12/21 >> EF 50 to 55%, mild LVH  LINES / TUBES: ETT 12/22 >>   CULTURES: Influenza PCR 12/20 >> negative Sputum 12/22 >>gpc>>  12-22 bc>> 12-20 uc>>neg  ANTIBIOTICS: Levaquin 12/20 >>  Tamiflu 12/22 >>   HISTORY OF PRESENT ILLNESS:   Pt intubated and sedated.  Therefore medical history obtained from review of medical record.  57 yo male smoker presented with 3 days of dyspnea, fever, and productive cough.  He also c/o generalized aches and chest pain with chest tightness.  He reported that his neighbor had similar symptoms.  He reported binge drinking of beer on weekends.  He was admitted to SDU by hospitalist team.  He was started on nebulizer therapy, antibiotics, and solumedrol.  He developed progressive respiratory symptoms and increased WOB.  He was tried on NIPPV, but developed progressive respiratory acidosis.  As a result he required intubation by anesthesia and mechanical ventilation.  PCCM was asked to assume care.  Unsure if pt has received influenza or pneumococcal vaccination.   SUBJECTIVE:  Awake on vent despite sedation VITAL SIGNS: Temp:  [98.2 F (36.8 C)-99 F (37.2 C)] 98.2 F (36.8 C) (12/23 0400) Pulse Rate:  [74-102] 98 (12/23 0400) Resp:  [14-22] 16 (12/23 0400) BP: (80-131)/(47-76) 127/54 mmHg (12/23 0400) SpO2:  [93 %-98 %] 95 % (12/23 0733) FiO2 (%):  [40 %] 40 % (12/23  0741) HEMODYNAMICS:   VENTILATOR SETTINGS: Vent Mode:  [-] CPAP FiO2 (%):  [40 %] 40 % Set Rate:  [16 bmp-24 bmp] 24 bmp Vt Set:  [600 mL] 600 mL PEEP:  [5 cmH20-10 cmH20] 10 cmH20 Pressure Support:  [5 cmH20] 5 cmH20 Plateau Pressure:  [19 cmH20-32 cmH20] 32 cmH20 INTAKE / OUTPUT: Intake/Output     12/22 0701 - 12/23 0700 12/23 0701 - 12/24 0700   P.O.     I.V. (mL/kg) 1405.6 (16.5)    NG/GT     IV Piggyback 150    Total Intake(mL/kg) 1555.6 (18.2)    Urine (mL/kg/hr) 1080 (0.5)    Emesis/NG output 300 (0.1)    Total Output 1380     Net +175.6            PHYSICAL EXAMINATION: General:  Intubated. Sedated. Appears stated age.  Neuro:  Sedated. Moves all 4 spontaneously. Arouses and follows commands HEENT:  Normal x for edentulous and presence of ET tube and OG tube Cardiovascular:  PMI non displaced. RRR no obvious murmurs, rubs or gallops Lungs: Decreased BS throughout. Mildly bronchitic. Bronchospastic++ Abdomen:  Soft. Nontender. Well-healed low-midline surgical scar. Decrease bowel sounds Extremities: Warm. Well perfused. No edema, cyanosis or clubbing    Musculoskeletal:  Normal tone and bulk. Skin:  No rash. Diaphoretic.   ECG: (12/21 7a)  NSR 79 No ST-T wave abnormalities.    LABS:  CBC  Recent Labs Lab 01/10/13 0015 01/11/13 0228 01/12/13 0344  WBC 4.8  15.9* 10.1  HGB 15.4 15.3 13.2  HCT 43.9 45.4 40.7  PLT 120* 137* 141*   Coag's No results found for this basename: APTT, INR,  in the last 168 hours BMET  Recent Labs Lab 01/10/13 0015 01/11/13 0228 01/12/13 0344  NA 130* 135 140  K 3.9 5.2* 5.2*  CL 93* 99 105  CO2 24 23 27   BUN 21 28* 54*  CREATININE 1.21 1.40* 1.59*  GLUCOSE 291* 180* 143*   Electrolytes  Recent Labs Lab 01/10/13 0015 01/11/13 0228 01/12/13 0344  CALCIUM 8.7 8.7 8.5  MG 2.1  --  2.8*  PHOS 3.1  --  3.0   Sepsis Markers No results found for this basename: LATICACIDVEN, PROCALCITON, O2SATVEN,  in the last 168  hours ABG  Recent Labs Lab 01/11/13 0222 01/12/13 0500 01/12/13 0605  PHART 7.203* 7.149* 7.267*  PCO2ART 65.3* 82.7* 62.5*  PO2ART 142.0* 118.0* 74.9*   Liver Enzymes  Recent Labs Lab 01/09/13 1520 01/10/13 0015  AST 27 26  ALT 17 18  ALKPHOS 82 70  BILITOT 0.5 0.4  ALBUMIN 4.0 3.6   Cardiac Enzymes  Recent Labs Lab 01/11/13 1142 01/11/13 1654 01/11/13 2320  TROPONINI <0.30 <0.30 <0.30   Glucose No results found for this basename: GLUCAP,  in the last 168 hours  Imaging Dg Chest Port 1 View  01/12/2013   CLINICAL DATA:  Intubation.  EXAM: PORTABLE CHEST - 1 VIEW  COMPARISON:  Chest x-ray 01/11/2013.  Chest CT 04/15/2012.  FINDINGS: Endotracheal tube noted with tip 5 cm above the carina. NG tube noted with tip below the left hemidiaphragm. Mediastinum and hilar structures are normal. Lungs are clear of acute infiltrates. No pleural effusion or pneumothorax. Heart size appears normal today's examination. Minimal prominence of pulmonary vascularity. No over pulmonary edema. Older right posterior rib fractures.  IMPRESSION: 1. Endotracheal tube noted with tip 5 cm above the carina. NG tube in stable position. 2. Minimal pulmonary venous congestion. No evidence of overt pulmonary edema. No focal infiltrate.   Electronically Signed   By: Maisie Fus  Register   On: 01/12/2013 07:12   Dg Chest Port 1 View  01/11/2013   CLINICAL DATA:  Intubation.  EXAM: PORTABLE CHEST - 1 VIEW  COMPARISON:  01/11/2013.  01/09/2013.  FINDINGS: Endotracheal tube and NG tube in stable position. Mild cardiomegaly and pulmonary venous congestion or today's exam. No evidence of overt pulmonary edema or focal pulmonary infiltrate. No pleural effusion or pneumothorax. Old right posterior rib fractures.  IMPRESSION: 1. Stable line and tube positions. 2. Mild cardiomegaly and pulmonary venous congestion on today's exam. No evidence of overt pulmonary edema. No focal infiltrate.   Electronically Signed   By:  Maisie Fus  Register   On: 01/11/2013 07:01   Dg Chest Port 1 View  01/11/2013   CLINICAL DATA:  Endotracheal intubation.  EXAM: PORTABLE CHEST - 1 VIEW  COMPARISON:  01/09/2013.  FINDINGS: Endotracheal tube tip is 88 mm from the carina, above the level of the clavicles. Enteric tube passes within the esophagus. Lungs demonstrate mild left basilar atelectasis. The cardiopericardial silhouette and mediastinal contours are within normal limits.  IMPRESSION: Interval intubation with the endotracheal tube tip 88 mm from the carina.   Electronically Signed   By: Andreas Newport M.D.   On: 01/11/2013 00:22   ABG    Component Value Date/Time   PHART 7.267* 01/12/2013 0605   PCO2ART 62.5* 01/12/2013 0605   PO2ART 74.9* 01/12/2013 0605   HCO3 27.6* 01/12/2013  0605   TCO2 25.2 01/12/2013 0605   ACIDBASEDEF 0.4 01/12/2013 0605   O2SAT 94.5 01/12/2013 0605    ASSESSMENT / PLAN:  PULMONARY A: Acute hypoxic/hypercapnic respiratory failure 2nd to AECOPD. Possible influenza. Continued tobacco abuse. P:   - Full vent support >> adjust settings to avoid PEEP but increased to 24 12-23 for ph 7.15 and pco2 83. - Adjust solumedrol regimen - Continue scheduled BD's - F/u CXR and ABG - He is not weanable by conventional standards.Full code. Still bronchospastic 12-23  Add prn BD's and increased steroids. - D/C tamiflu, influenza panel negative.  CARDIOVASCULAR A:  Sinus tachycardia >> likely related to acute respiratory distress. Chest pain on admission >> cardiac enzyme negative, ECG and echo unremarkable. P:  - Monitor hemodynamics - Maintain hydration - Consider outpatient stress testing   RENAL  Recent Labs Lab 01/10/13 0015 01/11/13 0228 01/12/13 0344  NA 130* 135 140    A:   Mild hyponatremia. Resolved P:   - Monitor electrolytes - Continue NS IV fluid - Monitor renal fx, urine outpt - Place foley catheter  GASTROINTESTINAL A:   Nutrition. P:   - Add tube feeds 12-23 -  Protonix for SUP  HEMATOLOGIC A:   Mild thrombocytopenia >> chronic, secondary to chemotherapy for hx of colon cancer. P:  - F/u CBC - Continue lovenox for DVT prevention  INFECTIOUS A:   AECOPD. High clinical suspicion for influenza even with negative influenza PCR. P:   - Continue levaquin - Continue tamiflu 12/22 x 5 days - Continue droplet isolation until clinically improved, and no longer febrile - Check sputum Cx(GPC)  ENDOCRINE A:   Steroid induced hyperglycemia. P:   - SSI while on solumedrol  NEUROLOGIC A:   Hx of Bipolar disorder, binge alcohol consumption >> on chronic benzo's as outpt. P:   - Sedation protocol with propofol, fentanyl and prn versed >> goal RASS -2 until respiratory status more stable - Continue abilify, lexapro - Continue thiamine, folic acid  Global: Not currently weanable unless code status changed.  Brett Canales Minor ACNP [facility] Pager [phone] till 3 pm If no answer page [phone] 01/12/2013, 8:27 AM  Severe COPD, remains acidotic and air trapping, not weanable at this time.  Will continue treatment for COPD for now.  CC time 35 min.  Patient seen and examined, agree with above note.  I dictated the care and orders written for this patient under my direction.  Alyson Reedy, MD [phone]

## 2013-01-13 ENCOUNTER — Inpatient Hospital Stay (HOSPITAL_COMMUNITY): Payer: Medicaid Other

## 2013-01-13 LAB — CBC
HCT: 40.4 % (ref 39.0–52.0)
Hemoglobin: 12.9 g/dL — ABNORMAL LOW (ref 13.0–17.0)
MCV: 101.3 fL — ABNORMAL HIGH (ref 78.0–100.0)
Platelets: 140 10*3/uL — ABNORMAL LOW (ref 150–400)
RBC: 3.99 MIL/uL — ABNORMAL LOW (ref 4.22–5.81)
RDW: 13.4 % (ref 11.5–15.5)
WBC: 7.9 10*3/uL (ref 4.0–10.5)

## 2013-01-13 LAB — GLUCOSE, CAPILLARY
Glucose-Capillary: 128 mg/dL — ABNORMAL HIGH (ref 70–99)
Glucose-Capillary: 138 mg/dL — ABNORMAL HIGH (ref 70–99)
Glucose-Capillary: 155 mg/dL — ABNORMAL HIGH (ref 70–99)
Glucose-Capillary: 157 mg/dL — ABNORMAL HIGH (ref 70–99)
Glucose-Capillary: 172 mg/dL — ABNORMAL HIGH (ref 70–99)

## 2013-01-13 LAB — BLOOD GAS, ARTERIAL
Bicarbonate: 28.3 mEq/L — ABNORMAL HIGH (ref 20.0–24.0)
Drawn by: 317871
O2 Saturation: 98.4 %
PEEP: 5 cmH2O
RATE: 24 resp/min
pCO2 arterial: 44.1 mmHg (ref 35.0–45.0)
pH, Arterial: 7.423 (ref 7.350–7.450)
pO2, Arterial: 112 mmHg — ABNORMAL HIGH (ref 80.0–100.0)

## 2013-01-13 LAB — BASIC METABOLIC PANEL
Chloride: 108 mEq/L (ref 96–112)
Creatinine, Ser: 0.99 mg/dL (ref 0.50–1.35)
GFR calc Af Amer: >90 mL/min (ref >90)
Glucose, Bld: 151 mg/dL — ABNORMAL HIGH (ref 70–99)
Potassium: 4.6 mEq/L (ref 3.5–5.1)

## 2013-01-13 LAB — MAGNESIUM: Magnesium: 3.1 mg/dL — ABNORMAL HIGH (ref 1.5–2.5)

## 2013-01-13 MED ORDER — METHYLPREDNISOLONE SODIUM SUCC 125 MG IJ SOLR
80.0000 mg | Freq: Two times a day (BID) | INTRAMUSCULAR | Status: DC
  Administered 2013-01-14 – 2013-01-17 (×7): 80 mg via INTRAVENOUS
  Filled 2013-01-13 (×2): qty 1.28
  Filled 2013-01-13: qty 2
  Filled 2013-01-13 (×3): qty 1.28
  Filled 2013-01-13: qty 2
  Filled 2013-01-13 (×4): qty 1.28

## 2013-01-13 MED ORDER — SODIUM PHOSPHATE 3 MMOLE/ML IV SOLN
30.0000 mmol | Freq: Once | INTRAVENOUS | Status: AC
  Administered 2013-01-13: 30 mmol via INTRAVENOUS
  Filled 2013-01-13: qty 10

## 2013-01-13 MED ORDER — CLONAZEPAM 1 MG PO TABS
1.0000 mg | ORAL_TABLET | Freq: Two times a day (BID) | ORAL | Status: DC
  Administered 2013-01-13 – 2013-01-15 (×5): 1 mg via ORAL
  Filled 2013-01-13 (×5): qty 1

## 2013-01-13 MED ORDER — VITAL AF 1.2 CAL PO LIQD
1000.0000 mL | ORAL | Status: DC
  Administered 2013-01-13 – 2013-01-14 (×3): 1000 mL
  Filled 2013-01-13 (×3): qty 1000

## 2013-01-13 NOTE — Progress Notes (Signed)
NUTRITION FOLLOW UP  Intervention:   Restart Vital AF 1.2 at 20 ml/hr and increase by 10ml every 4 hours to goal of 55 ml/hr. Goal rate will provide 1584 calories, 99 grams protein, 1070 ml free water. Goal rate TF plus current Propofol rate will provide 1983 calories and 99 grams protein- will meet 105% estimated calorie needs, 99% estimated protein needs.  RD to continue to monitor   Nutrition Dx:   Inadequate oral intake related to inability to eat as evidenced by NPO - ongoing    Goal:   Initiation of enteral nutrition with goal to meet >90% of estimated nutritional needs; TF initiated, goal nutritional needs not met  Monitor:   Weight; up 2 lbs from admission wt Labs; Blood glucose 112-165 mg/dL, high BUN, high magnesium, low phosphorus TF tolerance; tolerating Vital AF 1.2 @ 10 ml/hr Vent status; remains intubated this AM, possible extubation later today  Assessment:   Admitted with shortness of breath x 3 days. Hx of COPD and colon CA s/p chemoradiation. Pt with full dentures. Pt reported on admission that he drinks on weekends about 6-12 beers. Last drink was reported to be 5 days ago. Pt found to have acute COPD exacerbation.  12/21 Pt developed progressive respiratory acidosis requiring intubation. 12/23 TF initiated at a slow rate.  12/24 Vital AF 1.2 running @ 10 ml/hr through the night. 5 ml residual this AM. TF held at time of visit because it was thought pt may be extubated; RN to resume TF again this AM. Per RN, pt reported that abdominal distention is normal. Will increase TF more quickly.   Height: Ht Readings from Last 1 Encounters:  01/11/13 5\' 11"  (1.803 m)    Weight Status:   Wt Readings from Last 1 Encounters:  01/13/13 185 lb 3 oz (84 kg)   Patient is currently intubated on ventilator support.  MV: 8.8 L/min Temp (24hrs), Avg:98.4 F (36.9 C), Min:97.9 F (36.6 C), Max:98.9 F (37.2 C)  Propofol: 15.1 ml/hr (provides 399 kcal per 24  hours)  Re-estimated needs:  Kcal: 1891 Protein: 100-120 grams Fluid: 2.3-2.5 L/day  Skin: intact  Diet Order: NPO   Intake/Output Summary (Last 24 hours) at 01/13/13 0915 Last data filed at 01/13/13 0800  Gross per 24 hour  Intake 2014.5 ml  Output   1390 ml  Net  624.5 ml    Last BM:12/20   Labs:   Recent Labs Lab 01/10/13 0015 01/11/13 0228 01/12/13 0344 01/13/13 0330  NA 130* 135 140 142  K 3.9 5.2* 5.2* 4.6  CL 93* 99 105 108  CO2 24 23 27 28   BUN 21 28* 54* 52*  CREATININE 1.21 1.40* 1.59* 0.99  CALCIUM 8.7 8.7 8.5 8.6  MG 2.1  --  2.8* 3.1*  PHOS 3.1  --  3.0 1.9*  GLUCOSE 291* 180* 143* 151*    CBG (last 3)   Recent Labs  01/12/13 2108 01/12/13 2358 01/13/13 0412  GLUCAP 138* 157* 154*    Scheduled Meds: . albuterol  2.5 mg Nebulization Q6H  . antiseptic oral rinse  15 mL Mouth Rinse QID  . ARIPiprazole  2 mg Per Tube Daily  . chlorhexidine  15 mL Mouth Rinse BID  . docusate sodium  100 mg Oral BID  . enoxaparin (LOVENOX) injection  40 mg Subcutaneous QHS  . escitalopram  20 mg Per Tube Daily  . feeding supplement (VITAL AF 1.2 CAL)  1,000 mL Per Tube Q24H  . folic acid  1 mg Per Tube Daily  . ipratropium  0.5 mg Nebulization Q6H  . levofloxacin (LEVAQUIN) IV  750 mg Intravenous Q24H  . methylPREDNISolone (SOLU-MEDROL) injection  80 mg Intravenous Q8H  . nicotine  7 mg Transdermal Daily  . oseltamivir  75 mg Per Tube BID  . pantoprazole sodium  40 mg Per Tube QHS  . sodium chloride  3 mL Intravenous Q12H  . sodium phosphate  Dextrose 5% IVPB  30 mmol Intravenous Once  . thiamine  100 mg Per Tube Daily    Continuous Infusions: . sodium chloride 50 mL/hr at 01/11/13 1112  . fentaNYL infusion INTRAVENOUS 50 mcg/hr (01/13/13 0800)  . propofol 10 mcg/kg/min (01/13/13 0800)    Ian Malkin RD, LDN Inpatient Clinical Dietitian Pager: [phone] After Hours Pager: [phone]

## 2013-01-13 NOTE — Progress Notes (Signed)
eLink Physician-Brief Progress Note Patient Name: Taylor [name] DOB: 1955/04/26 MRN: [id number]  Date of Service  01/13/2013   HPI/Events of Note  Hypophosphatemia   eICU Interventions  Phos replaced   Intervention Category Intermediate Interventions: Electrolyte abnormality - evaluation and management  DETERDING,ELIZABETH 01/13/2013, 4:45 AM

## 2013-01-13 NOTE — Progress Notes (Signed)
Name: Taylor [name] MRN: [id number] DOB: 04/28/1955    ADMISSION DATE:  01/09/2013 CONSULTATION DATE:  01/10/2013  REFERRING MD :  Everett Graff  CHIEF COMPLAINT:  Can't breath  BRIEF PATIENT DESCRIPTION:  57 yo male smoker admit with AECOPD.  Developed VDRF 12/21 and PCCM assumed care.  Followed by Dr. Sherene Sires in pulmonary office for GOLD IV COPD with emphysema.  SIGNIFICANT EVENTS: 12/20 Admit to SDU 12/21 VDRF, PCCM assumed care 12-23 hypercarbic acidosis 12-24 failed weaning  STUDIES:  PFT's 12/15/2012 >> FEV1 0.89 (24%) with Ratio 38 p am saba and DLCO 44% with 68  Echo 12/21 >> EF 50 to 55%, mild LVH  LINES / TUBES: ETT 12/22 >>   CULTURES: Influenza PCR 12/20 >> negative Sputum 12/22 >>gpc>>  12-22 bc>> 12-20 uc>>neg  ANTIBIOTICS: Levaquin 12/20 >>  Tamiflu 12/22 >>   HISTORY OF PRESENT ILLNESS:   Pt intubated and sedated.  Therefore medical history obtained from review of medical record.  57 yo male smoker presented with 3 days of dyspnea, fever, and productive cough.  He also c/o generalized aches and chest pain with chest tightness.  He reported that his neighbor had similar symptoms.  He reported binge drinking of beer on weekends.  He was admitted to SDU by hospitalist team.  He was started on nebulizer therapy, antibiotics, and solumedrol.  He developed progressive respiratory symptoms and increased WOB.  He was tried on NIPPV, but developed progressive respiratory acidosis.  As a result he required intubation by anesthesia and mechanical ventilation.  PCCM was asked to assume care.  Unsure if pt has received influenza or pneumococcal vaccination.   SUBJECTIVE:  Awake on vent despite sedation VITAL SIGNS: Temp:  [97.9 F (36.6 C)-98.9 F (37.2 C)] 98.6 F (37 C) (12/24 0800) Pulse Rate:  [61-111] 88 (12/24 0900) Resp:  [12-27] 21 (12/24 0900) BP: (89-138)/(58-82) 117/70 mmHg (12/24 0900) SpO2:  [91 %-98 %] 91 % (12/24 0900) FiO2 (%):   [30 %-40 %] 30 % (12/24 0921) Weight:  [185 lb 3 oz (84 kg)-192 lb 3.9 oz (87.2 kg)] 185 lb 3 oz (84 kg) (12/24 0500) HEMODYNAMICS:   VENTILATOR SETTINGS: Vent Mode:  [-] PRVC FiO2 (%):  [30 %-40 %] 30 % Set Rate:  [20 bmp-24 bmp] 20 bmp Vt Set:  [600 mL] 600 mL PEEP:  [2 cmH20-5 cmH20] 5 cmH20 Pressure Support:  [10 cmH20] 10 cmH20 Plateau Pressure:  [25 cmH20-38 cmH20] 38 cmH20 INTAKE / OUTPUT: Intake/Output     12/23 0701 - 12/24 0700 12/24 0701 - 12/25 0700   I.V. (mL/kg) 1705.7 (20.3) 123 (1.5)   NG/GT 140 20   IV Piggyback 193 86   Total Intake(mL/kg) 2038.7 (24.3) 229 (2.7)   Urine (mL/kg/hr) 1385 (0.7) 65 (0.3)   Emesis/NG output     Total Output 1385 65   Net +653.7 +164          PHYSICAL EXAMINATION: General:  Intubated. Sedated. Appears stated age.  Neuro:  Sedated. Moves all 4 spontaneously. Arouses and follows commands. Wean weaning becomes highly agitated. HEENT:  Normal x for edentulous and presence of ET tube and OG tube Cardiovascular:   RRR  Lungs: Decreased BS throughout. Mildly bronchitic. Bronchospastic++ Abdomen:  Soft. Nontender. Well-healed low-midline surgical scar. Decrease bowel sounds Extremities: Warm. Well perfused. No edema, cyanosis or clubbing    Musculoskeletal:  Normal tone and bulk. Skin:  No rash. Diaphoretic.   ECG: (12/21 7a)  NSR 79 No ST-T wave  abnormalities.    LABS:  CBC  Recent Labs Lab 01/11/13 0228 01/12/13 0344 01/13/13 0330  WBC 15.9* 10.1 7.9  HGB 15.3 13.2 12.9*  HCT 45.4 40.7 40.4  PLT 137* 141* 140*   Coag's No results found for this basename: APTT, INR,  in the last 168 hours BMET  Recent Labs Lab 01/11/13 0228 01/12/13 0344 01/13/13 0330  NA 135 140 142  K 5.2* 5.2* 4.6  CL 99 105 108  CO2 23 27 28   BUN 28* 54* 52*  CREATININE 1.40* 1.59* 0.99  GLUCOSE 180* 143* 151*   Electrolytes  Recent Labs Lab 01/10/13 0015 01/11/13 0228 01/12/13 0344 01/13/13 0330  CALCIUM 8.7 8.7 8.5 8.6  MG  2.1  --  2.8* 3.1*  PHOS 3.1  --  3.0 1.9*   Sepsis Markers No results found for this basename: LATICACIDVEN, PROCALCITON, O2SATVEN,  in the last 168 hours ABG  Recent Labs Lab 01/12/13 0500 01/12/13 0605 01/13/13 0351  PHART 7.149* 7.267* 7.423  PCO2ART 82.7* 62.5* 44.1  PO2ART 118.0* 74.9* 112.0*   Liver Enzymes  Recent Labs Lab 01/09/13 1520 01/10/13 0015  AST 27 26  ALT 17 18  ALKPHOS 82 70  BILITOT 0.5 0.4  ALBUMIN 4.0 3.6   Cardiac Enzymes  Recent Labs Lab 01/11/13 1142 01/11/13 1654 01/11/13 2320  TROPONINI <0.30 <0.30 <0.30   Glucose  Recent Labs Lab 01/12/13 1617 01/12/13 2108 01/12/13 2358 01/13/13 0412 01/13/13 0915  GLUCAP 112* 138* 157* 154* 128*    Imaging Dg Chest Port 1 View  01/13/2013   CLINICAL DATA:  COPD.  EXAM: PORTABLE CHEST - 1 VIEW  COMPARISON:  01/12/2013.  FINDINGS: Endotracheal tube and NG tube in stable position. Mediastinum and hilar structures are normal. Lungs are clear. No pleural effusion. No pneumothorax identified. Pulmonary apices incompletely imaged. Heart size and pulmonary vascularity normal on today's exam. Old right posterior rib fractures.  IMPRESSION: 1. Stable line and tube positions. 2. Interval resolution of mild pulmonary venous congestion. No acute cardiopulmonary disease noted.   Electronically Signed   By: Maisie Fus  Register   On: 01/13/2013 06:40   Dg Chest Port 1 View  01/12/2013   CLINICAL DATA:  Intubation.  EXAM: PORTABLE CHEST - 1 VIEW  COMPARISON:  Chest x-ray 01/11/2013.  Chest CT 04/15/2012.  FINDINGS: Endotracheal tube noted with tip 5 cm above the carina. NG tube noted with tip below the left hemidiaphragm. Mediastinum and hilar structures are normal. Lungs are clear of acute infiltrates. No pleural effusion or pneumothorax. Heart size appears normal today's examination. Minimal prominence of pulmonary vascularity. No over pulmonary edema. Older right posterior rib fractures.  IMPRESSION: 1.  Endotracheal tube noted with tip 5 cm above the carina. NG tube in stable position. 2. Minimal pulmonary venous congestion. No evidence of overt pulmonary edema. No focal infiltrate.   Electronically Signed   By: Maisie Fus  Register   On: 01/12/2013 07:12   Dg Abd Portable 1v  01/12/2013   CLINICAL DATA:  Status post orogastric tube placement.  EXAM: PORTABLE ABDOMEN - 1 VIEW  COMPARISON:  Abdomen and pelvis CT dated 04/15/2012.  FINDINGS: Normal bowel gas pattern. Orogastric tube tip in the distal stomach in the region of the gastric pylorus. Lumbar and lower thoracic spine degenerative changes.  IMPRESSION: Orogastric tube tip in the distal stomach.   Electronically Signed   By: Gordan Payment M.D.   On: 01/12/2013 21:01   ABG    Component Value Date/Time  PHART 7.423 01/13/2013 0351   PCO2ART 44.1 01/13/2013 0351   PO2ART 112.0* 01/13/2013 0351   HCO3 28.3* 01/13/2013 0351   TCO2 25.1 01/13/2013 0351   ACIDBASEDEF 0.4 01/12/2013 0605   O2SAT 98.4 01/13/2013 0351    ASSESSMENT / PLAN:  PULMONARY A: Acute hypoxic/hypercapnic respiratory failure 2nd to AECOPD. Possible influenza. Continued tobacco abuse. P:   - Full vent support >> adjust settings to avoid PEEP but increased to 24 12-23 for ph 7.15 and pco2 83. - Adjust solumedrol regimen - Continue scheduled BD's - F/u CXR and ABG - He is not weanable by conventional standards.Full code. Still bronchospastic 12-23  Add prn BD's and increased steroids. 12-24 failed weaning. He will most likely need trach LTAC or palliation in future. - D/C tamiflu, influenza panel negative.  CARDIOVASCULAR A:  Sinus tachycardia >> likely related to acute respiratory distress. Chest pain on admission >> cardiac enzyme negative, ECG and echo unremarkable. P:  - Monitor hemodynamics - Maintain hydration    RENAL  Recent Labs Lab 01/11/13 0228 01/12/13 0344 01/13/13 0330  NA 135 140 142    A:   Mild hyponatremia. Resolved P:   - Monitor  electrolytes - Continue NS IV fluid - Monitor renal fx, urine outpt -  foley catheter  GASTROINTESTINAL A:   Nutrition. P:   - Add tube feeds 12-23 - Protonix for SUP  HEMATOLOGIC A:   Mild thrombocytopenia >> chronic, secondary to chemotherapy for hx of colon cancer. P:  - F/u CBC - Continue lovenox for DVT prevention  INFECTIOUS A:   AECOPD. High clinical suspicion for influenza even with negative influenza PCR. P:   - Continue levaquin - Continue tamiflu 12/22 x 5 days - Continue droplet isolation until clinically improved, and no longer febrile - Check sputum Cx(GPC)  ENDOCRINE A:   Steroid induced hyperglycemia. P:   - SSI while on solumedrol  NEUROLOGIC A:   Hx of Bipolar disorder, binge alcohol consumption >> on chronic benzo's as outpt. P:   - Sedation protocol with propofol, fentanyl and prn versed >> goal RASS -2 until respiratory status more stable - Continue abilify, lexapro - Continue thiamine, folic acid -12-24 low dose klonopin for agitation  Global: Not currently weanable unless code status changed. Add low dose oral sedation to help with agitation.  Brett Canales Minor ACNP [facility] PCCM Pager [phone] till 3 pm If no answer page [phone] 01/13/2013, 9:29 AM  Failed weaning today due to desaturation and increased work of breathing.  Need to meet with family regarding plan of care prior to serious consideration of extubation.  CC time 35 min.  Patient seen and examined, agree with above note.  I dictated the care and orders written for this patient under my direction.  Alyson Reedy, MD [phone]

## 2013-01-14 ENCOUNTER — Inpatient Hospital Stay (HOSPITAL_COMMUNITY): Payer: Medicaid Other

## 2013-01-14 DIAGNOSIS — K3189 Other diseases of stomach and duodenum: Secondary | ICD-10-CM

## 2013-01-14 LAB — CULTURE, RESPIRATORY

## 2013-01-14 LAB — BASIC METABOLIC PANEL
CO2: 26 mEq/L (ref 19–32)
Calcium: 8.3 mg/dL — ABNORMAL LOW (ref 8.4–10.5)
Chloride: 110 mEq/L (ref 96–112)
GFR calc non Af Amer: >90 mL/min (ref >90)
Glucose, Bld: 175 mg/dL — ABNORMAL HIGH (ref 70–99)
Potassium: 4.9 mEq/L (ref 3.5–5.1)
Sodium: 145 mEq/L (ref 135–145)

## 2013-01-14 LAB — CULTURE, RESPIRATORY W GRAM STAIN

## 2013-01-14 LAB — CBC
HCT: 40.9 % (ref 39.0–52.0)
Hemoglobin: 13.4 g/dL (ref 13.0–17.0)
MCH: 33.2 pg (ref 26.0–34.0)
MCHC: 32.8 g/dL (ref 30.0–36.0)
MCV: 101.2 fL — ABNORMAL HIGH (ref 78.0–100.0)
Platelets: 149 10*3/uL — ABNORMAL LOW (ref 150–400)
RBC: 4.04 MIL/uL — ABNORMAL LOW (ref 4.22–5.81)

## 2013-01-14 LAB — GLUCOSE, CAPILLARY
Glucose-Capillary: 129 mg/dL — ABNORMAL HIGH (ref 70–99)
Glucose-Capillary: 129 mg/dL — ABNORMAL HIGH (ref 70–99)
Glucose-Capillary: 151 mg/dL — ABNORMAL HIGH (ref 70–99)
Glucose-Capillary: 194 mg/dL — ABNORMAL HIGH (ref 70–99)

## 2013-01-14 MED ORDER — DOCUSATE SODIUM 50 MG/5ML PO LIQD
100.0000 mg | Freq: Two times a day (BID) | ORAL | Status: DC
  Filled 2013-01-14 (×2): qty 10

## 2013-01-14 MED ORDER — ALBUTEROL SULFATE (5 MG/ML) 0.5% IN NEBU
2.5000 mg | INHALATION_SOLUTION | RESPIRATORY_TRACT | Status: DC
  Administered 2013-01-14 – 2013-01-16 (×12): 2.5 mg via RESPIRATORY_TRACT
  Filled 2013-01-14 (×12): qty 0.5

## 2013-01-14 MED ORDER — SENNOSIDES-DOCUSATE SODIUM 8.6-50 MG PO TABS
2.0000 | ORAL_TABLET | Freq: Every day | ORAL | Status: DC
  Administered 2013-01-14 – 2013-01-18 (×5): 2 via ORAL
  Administered 2013-01-19: 1 via ORAL
  Administered 2013-01-20: 2 via ORAL
  Filled 2013-01-14 (×10): qty 2

## 2013-01-14 MED ORDER — INSULIN ASPART 100 UNIT/ML ~~LOC~~ SOLN
0.0000 [IU] | SUBCUTANEOUS | Status: DC
  Administered 2013-01-14: 2 [IU] via SUBCUTANEOUS
  Administered 2013-01-14: 3 [IU] via SUBCUTANEOUS
  Administered 2013-01-14 (×2): 2 [IU] via SUBCUTANEOUS
  Administered 2013-01-14 – 2013-01-15 (×2): 3 [IU] via SUBCUTANEOUS
  Administered 2013-01-15: 2 [IU] via SUBCUTANEOUS
  Administered 2013-01-15 (×2): 3 [IU] via SUBCUTANEOUS
  Administered 2013-01-15: 2 [IU] via SUBCUTANEOUS
  Administered 2013-01-16 (×2): 3 [IU] via SUBCUTANEOUS
  Administered 2013-01-16: 5 [IU] via SUBCUTANEOUS
  Administered 2013-01-16 – 2013-01-17 (×3): 2 [IU] via SUBCUTANEOUS
  Administered 2013-01-17: 5 [IU] via SUBCUTANEOUS
  Administered 2013-01-17: 3 [IU] via SUBCUTANEOUS
  Administered 2013-01-17: 5 [IU] via SUBCUTANEOUS
  Administered 2013-01-18: 3 [IU] via SUBCUTANEOUS
  Administered 2013-01-18 (×2): 2 [IU] via SUBCUTANEOUS
  Administered 2013-01-18: 3 [IU] via SUBCUTANEOUS
  Administered 2013-01-19: 5 [IU] via SUBCUTANEOUS
  Administered 2013-01-19 (×2): 3 [IU] via SUBCUTANEOUS

## 2013-01-14 MED ORDER — IPRATROPIUM BROMIDE 0.02 % IN SOLN
0.5000 mg | RESPIRATORY_TRACT | Status: DC
  Administered 2013-01-14 – 2013-01-16 (×12): 0.5 mg via RESPIRATORY_TRACT
  Filled 2013-01-14 (×12): qty 2.5

## 2013-01-14 MED ORDER — FENTANYL CITRATE 0.05 MG/ML IJ SOLN
50.0000 ug | INTRAMUSCULAR | Status: DC | PRN
  Administered 2013-01-14: 200 ug via INTRAVENOUS
  Administered 2013-01-14 (×4): 100 ug via INTRAVENOUS
  Administered 2013-01-15: 200 ug via INTRAVENOUS
  Administered 2013-01-15 (×3): 100 ug via INTRAVENOUS
  Administered 2013-01-15: 200 ug via INTRAVENOUS
  Filled 2013-01-14 (×2): qty 2
  Filled 2013-01-14: qty 4
  Filled 2013-01-14: qty 2
  Filled 2013-01-14: qty 4
  Filled 2013-01-14 (×2): qty 2
  Filled 2013-01-14: qty 4
  Filled 2013-01-14: qty 2
  Filled 2013-01-14: qty 4

## 2013-01-14 NOTE — Progress Notes (Signed)
Increased tube feeding rate at 20:00 to 7ml/hr. Pt's residual -140. Did not increase tube feeding rate due to first high residual of the day.  Will recheck residual at 04:00 and will continue to monitor pt.

## 2013-01-14 NOTE — Progress Notes (Signed)
Placed patient back on full ventilator support at 1450. Patient has required boluses throughout the day due to gagging on ET tube, turning red and flushed.  Patient follows some commands.  Often raises his hands to his ET tube.  Increased propofol and administered IV fentanyl 200 mcg.  Patient is resting comfortably and tolerating ventilator.

## 2013-01-14 NOTE — Progress Notes (Signed)
Name: Taylor [name] MRN: [id number] DOB: 06-26-1955    ADMISSION DATE:  01/09/2013 CONSULTATION DATE:  01/10/2013  REFERRING MD :  Everett Graff  CHIEF COMPLAINT:  Can't breath  BRIEF PATIENT DESCRIPTION:  57 yo male smoker admit with AECOPD.  Developed VDRF 12/21 and PCCM assumed care.  Followed by Dr. Sherene Sires in pulmonary office for GOLD IV COPD with emphysema.   has a past medical history of Inguinal hernia; Depression; Hemorrhoids; Full dentures; Rib fractures; Bipolar 1 disorder (02/05/2012); Shortness of breath; COPD (chronic obstructive pulmonary disease); Cancer (04/11/11); and Colon cancer (04/11/2011).   has past surgical history that includes Colostomy revision (04/11/2011); Colon surgery (04/11/11); Portacath placement; Portacath placement (05/02/2011); Inguinal hernia repair (Right, 05/01/2012); Insertion of mesh (Right, 05/01/2012); and Port-a-cath removal (Left, 05/01/2012).   SIGNIFICANT EVENTS: 12/20 Admit to SDU 12/21 VDRF, PCCM assumed care 12-23 hypercarbic acidosis 12-24 failed weaning  STUDIES:  PFT's 12/15/2012 >> FEV1 0.89 (24%) with Ratio 38 p am saba and DLCO 44% with 68  Echo 12/21 >> EF 50 to 55%, mild LVH  LINES / TUBES: ETT 12/22 >>   CULTURES: Influenza PCR 12/20 >> negative Sputum 12/22 > NORMAL FLORA 12-22 bc>> 12-20 uc>>neg 01/14/13 - RESP VIRUS PCR  (tracheal aspirate)     ANTIBIOTICS: Levaquin 12/20 >>  Tamiflu 12/22 >>  (12/;26)    SUBJECTIVE:   01/14/13: Severe cough with WUA and also gag. On diprivan and fent gtt for sedation. He is weaning on sedation. Improved bronchospasm  VITAL SIGNS: Temp:  [97.4 F (36.3 C)-98.2 F (36.8 C)] 97.6 F (36.4 C) (12/25 0809) Pulse Rate:  [45-67] 52 (12/25 0700) Resp:  [9-25] 20 (12/25 0700) BP: (102-143)/(57-84) 115/69 mmHg (12/25 0809) SpO2:  [94 %-97 %] 97 % (12/25 0700) FiO2 (%):  [30 %] 30 % (12/25 0809) Weight:  [85.2 kg (187 lb 13.3 oz)] 85.2 kg (187 lb 13.3 oz) (12/25  0147) HEMODYNAMICS:   VENTILATOR SETTINGS: Vent Mode:  [-] CPAP FiO2 (%):  [30 %] 30 % Set Rate:  [20 bmp] 20 bmp Vt Set:  [600 mL] 600 mL PEEP:  [5 cmH20] 5 cmH20 Pressure Support:  [5 cmH20] 5 cmH20 Plateau Pressure:  [11 cmH20-28 cmH20] 28 cmH20 INTAKE / OUTPUT: Intake/Output     12/24 0701 - 12/25 0700 12/25 0701 - 12/26 0700   I.V. (mL/kg) 1647.6 (19.3)    NG/GT 862.5    IV Piggyback 322    Total Intake(mL/kg) 2832.1 (33.2)    Urine (mL/kg/hr) 1435 (0.7)    Total Output 1435     Net +1397.1            PHYSICAL EXAMINATION: General:  Intubated. Sedated. Appears stated age.  Neuro:  Sedated. Moves all 4 spontaneously. Per RN during WUA" Arouses and follows commands but coughs a lot precluding wean HEENT:  Normal x for edentulous and presence of ET tube and OG tube Cardiovascular:   RRR  Lungs: Decreased BS throughout. No wheeze. Sync with vent Abdomen:  Soft. Nontender. Well-healed low-midline surgical scar. Decrease bowel sounds Extremities: Warm. Well perfused. No edema, cyanosis or clubbing    Musculoskeletal:  Normal tone and bulk. Skin:  No rash. Diaphoretic.      LABS: PULMONARY  Recent Labs Lab 01/10/13 2300 01/11/13 0222 01/12/13 0500 01/12/13 0605 01/13/13 0351  PHART 7.199* 7.203* 7.149* 7.267* 7.423  PCO2ART 68.3* 65.3* 82.7* 62.5* 44.1  PO2ART 69.7* 142.0* 118.0* 74.9* 112.0*  HCO3 25.6* 24.7* 27.5* 27.6* 28.3*  TCO2 23.1  22.5 25.8 25.2 25.1  O2SAT 91.1 98.7 97.7 94.5 98.4    CBC  Recent Labs Lab 01/12/13 0344 01/13/13 0330 01/14/13 0325  HGB 13.2 12.9* 13.4  HCT 40.7 40.4 40.9  WBC 10.1 7.9 6.9  PLT 141* 140* 149*    COAGULATION No results found for this basename: INR,  in the last 168 hours  CARDIAC   Recent Labs Lab 01/10/13 0651 01/10/13 1203 01/11/13 1142 01/11/13 1654 01/11/13 2320  TROPONINI <0.30 <0.30 <0.30 <0.30 <0.30   No results found for this basename: PROBNP,  in the last 168 hours   CHEMISTRY  Recent  Labs Lab 01/09/13 1520 01/10/13 0015 01/11/13 0228 01/12/13 0344 01/13/13 0330 01/14/13 0325  NA 131* 130* 135 140 142 145  K 4.2 3.9 5.2* 5.2* 4.6 4.9  CL 94* 93* 99 105 108 110  CO2 26 24 23 27 28 26   GLUCOSE 127* 291* 180* 143* 151* 175*  BUN 14 21 28* 54* 52* 46*  CREATININE 1.03 1.21 1.40* 1.59* 0.99 0.69  CALCIUM 8.8 8.7 8.7 8.5 8.6 8.3*  MG  --  2.1  --  2.8* 3.1*  --   PHOS  --  3.1  --  3.0 1.9*  --    Estimated Creatinine Clearance: 107.2 ml/min (by C-G formula based on Cr of 0.69).   LIVER  Recent Labs Lab 01/09/13 1520 01/10/13 0015  AST 27 26  ALT 17 18  ALKPHOS 82 70  BILITOT 0.5 0.4  PROT 7.2 6.8  ALBUMIN 4.0 3.6     INFECTIOUS No results found for this basename: LATICACIDVEN, PROCALCITON,  in the last 168 hours   ENDOCRINE CBG (last 3)   Recent Labs  01/14/13 0454 01/14/13 0809 01/14/13 1014  GLUCAP 186* 166* 129*         IMAGING x48h  Dg Chest Port 1 View  01/14/2013   CLINICAL DATA:  Intubated patient.  EXAM: PORTABLE CHEST - 1 VIEW  COMPARISON:  01/14/2012  FINDINGS: Heart, mediastinum and hila are unremarkable.  Lungs are mildly hyperexpanded but clear. No pleural effusion or pneumothorax.  Endotracheal tube tip lies 5.5 cm above the carina nasogastric tube passes below the diaphragm into the stomach.  IMPRESSION: No acute cardiopulmonary disease.  Support apparatus is stable in well positioned.   Electronically Signed   By: Amie [place] M.D.   On: 01/14/2013 07:13   Dg Chest Port 1 View  01/13/2013   CLINICAL DATA:  COPD.  EXAM: PORTABLE CHEST - 1 VIEW  COMPARISON:  01/12/2013.  FINDINGS: Endotracheal tube and NG tube in stable position. Mediastinum and hilar structures are normal. Lungs are clear. No pleural effusion. No pneumothorax identified. Pulmonary apices incompletely imaged. Heart size and pulmonary vascularity normal on today's exam. Old right posterior rib fractures.  IMPRESSION: 1. Stable line and tube positions. 2.  Interval resolution of mild pulmonary venous congestion. No acute cardiopulmonary disease noted.   Electronically Signed   By: Maisie Fus  Register   On: 01/13/2013 06:40   Dg Abd Portable 1v  01/12/2013   CLINICAL DATA:  Status post orogastric tube placement.  EXAM: PORTABLE ABDOMEN - 1 VIEW  COMPARISON:  Abdomen and pelvis CT dated 04/15/2012.  FINDINGS: Normal bowel gas pattern. Orogastric tube tip in the distal stomach in the region of the gastric pylorus. Lumbar and lower thoracic spine degenerative changes.  IMPRESSION: Orogastric tube tip in the distal stomach.   Electronically Signed   By: Gordan Payment M.D.   On: 01/12/2013  21:01     ASSESSMENT / PLAN:  PULMONARY A: Acute hypoxic/hypercapnic respiratory failure 2nd to AECOPD. Possible influenza. Continued tobacco abuse.   01/14/13: Coughs gag on WUA - ? Due to high ET tube. Currentlyu precluding wean  P:   - advance et tube 3cm and repeat CXR, REasses cough/gag after that - decrease sedation - Full vent support >> adjust settings to avoid PEEP but increased to 24 12-23 for ph 7.15 and pco2 83. - solumedrol regimen - Continue scheduled BD's but increased to q4h -Monitor closely; if extubated, extubate to bipap given severe copd  CARDIOVASCULAR A:  Sinus tachycardia >> likely related to acute respiratory distress. Chest pain on admission >> cardiac enzyme negative, ECG and echo unremarkable. P:  - Monitor hemodynamics - Maintain hydration    RENAL A:   Mild hyponatremia. Resolved P:   - Monitor electrolytes - Fluids rdyuce to KVP  -GASTROINTESTINAL A:   On TF since 01/12/13  01/14/13 - constipated . P:   - Add tube feeds 12-23 - Protonix for SUP - colace - add senna  HEMATOLOGIC A:   Mild thrombocytopenia >> chronic, secondary to chemotherapy for hx of colon cancer. P:  - F/u CBC - Continue lovenox for DVT prevention  INFECTIOUS A:   AECOPD. High clinical suspicion for influenza even with negative  influenza PCR. Also other virus related AECOPD possible   P:   - heck multiplex flu and other resp virus PCR via trach aspirate (deeper sample, higher yield) - Continue levaquin - Continue tamiflu 12/22 x 5 days - Continue droplet isolation until clinically improved, and no longer febrile   ENDOCRINE A:   Steroid induced hyperglycemia. P:   - SSI while on solumedrol  NEUROLOGIC A:   Hx of Bipolar disorder, binge alcohol consumption >> on chronic benzo's as outpt. P:   - Sedation protocol with propofol, fentanyl and prn versed >> goal RASS -2 until respiratory status more stable - Continue abilify, lexapro - Continue thiamine, folic acid -12-24 low dose klonopin for agitation  Global:   01/13/13: Not currently weanable unless code status changed. Add low dose oral sedation to help with agitation.  01/04/13: No family at bedside. Advance ET tube and see if cough will go away to help extubation 01/15/13. If extubatable, exubate to bipap    The patient is critically ill with multiple organ systems failure and requires high complexity decision making for assessment and support, frequent evaluation and titration of therapies, application of advanced monitoring technologies and extensive interpretation of multiple databases.   Critical Care Time devoted to patient care services described in this note is  35  Minutes.  Dr. Kalman Shan, M.D., [facility].C.P Pulmonary and Critical Care Medicine Staff Physician [facility] Pager: [phone], If no answer or between  15:00h - 7:00h: call [phone]  01/14/2013 11:38 AM

## 2013-01-15 ENCOUNTER — Inpatient Hospital Stay (HOSPITAL_COMMUNITY): Payer: Medicaid Other

## 2013-01-15 LAB — CBC WITH DIFFERENTIAL/PLATELET
Basophils Absolute: 0 10*3/uL (ref 0.0–0.1)
HCT: 40.6 % (ref 39.0–52.0)
Hemoglobin: 12.8 g/dL — ABNORMAL LOW (ref 13.0–17.0)
Lymphocytes Relative: 9 % — ABNORMAL LOW (ref 12–46)
Monocytes Absolute: 0.5 10*3/uL (ref 0.1–1.0)
Monocytes Relative: 8 % (ref 3–12)
Neutro Abs: 5.3 10*3/uL (ref 1.7–7.7)
Neutrophils Relative %: 83 % — ABNORMAL HIGH (ref 43–77)
WBC: 6.4 10*3/uL (ref 4.0–10.5)

## 2013-01-15 LAB — RESPIRATORY VIRUS PANEL
Adenovirus: NOT DETECTED
Influenza A H1: NOT DETECTED
Influenza A H3: NOT DETECTED
Influenza B: NOT DETECTED
Parainfluenza 1: NOT DETECTED
Parainfluenza 2: NOT DETECTED
Respiratory Syncytial Virus A: NOT DETECTED
Respiratory Syncytial Virus B: NOT DETECTED
Rhinovirus: NOT DETECTED

## 2013-01-15 LAB — BASIC METABOLIC PANEL
BUN: 45 mg/dL — ABNORMAL HIGH (ref 6–23)
CO2: 32 mEq/L (ref 19–32)
Calcium: 8.3 mg/dL — ABNORMAL LOW (ref 8.4–10.5)
Chloride: 110 mEq/L (ref 96–112)
Creatinine, Ser: 0.75 mg/dL (ref 0.50–1.35)
Potassium: 4.2 mEq/L (ref 3.5–5.1)
Sodium: 147 mEq/L — ABNORMAL HIGH (ref 135–145)

## 2013-01-15 LAB — GLUCOSE, CAPILLARY
Glucose-Capillary: 160 mg/dL — ABNORMAL HIGH (ref 70–99)
Glucose-Capillary: 173 mg/dL — ABNORMAL HIGH (ref 70–99)
Glucose-Capillary: 103 mg/dL — ABNORMAL HIGH (ref 70–99)
Glucose-Capillary: 143 mg/dL — ABNORMAL HIGH (ref 70–99)
Glucose-Capillary: 156 mg/dL — ABNORMAL HIGH (ref 70–99)
Glucose-Capillary: 111 mg/dL — ABNORMAL HIGH (ref 70–99)

## 2013-01-15 LAB — BLOOD GAS, ARTERIAL
Drawn by: 331471
FIO2: 0.3 %
MECHVT: 600 mL
Patient temperature: 98.6
RATE: 12 resp/min
pCO2 arterial: 55.6 mmHg — ABNORMAL HIGH (ref 35.0–45.0)
pH, Arterial: 7.427 (ref 7.350–7.450)

## 2013-01-15 LAB — CULTURE, BLOOD (ROUTINE X 2): Culture: NO GROWTH

## 2013-01-15 LAB — MAGNESIUM: Magnesium: 2.7 mg/dL — ABNORMAL HIGH (ref 1.5–2.5)

## 2013-01-15 LAB — PHOSPHORUS: Phosphorus: 2 mg/dL — ABNORMAL LOW (ref 2.3–4.6)

## 2013-01-15 MED ORDER — MIDAZOLAM HCL 2 MG/2ML IJ SOLN
INTRAMUSCULAR | Status: AC
  Filled 2013-01-15: qty 2

## 2013-01-15 MED ORDER — FENTANYL CITRATE 0.05 MG/ML IJ SOLN
INTRAMUSCULAR | Status: AC
  Filled 2013-01-15: qty 2

## 2013-01-15 MED ORDER — MIDAZOLAM HCL 2 MG/2ML IJ SOLN: 2.0000 mg | INTRAMUSCULAR | Status: DC | PRN

## 2013-01-15 MED ORDER — ROCURONIUM BROMIDE 50 MG/5ML IV SOLN
70.0000 mg | Freq: Once | INTRAVENOUS | Status: AC
  Administered 2013-01-15: 70 mg via INTRAVENOUS
  Filled 2013-01-15: qty 7

## 2013-01-15 MED ORDER — SODIUM CHLORIDE 0.9 % IV SOLN
0.0000 ug/h | INTRAVENOUS | Status: DC
  Administered 2013-01-15: 50 ug/h via INTRAVENOUS
  Administered 2013-01-16: 15 ug/h via INTRAVENOUS
  Administered 2013-01-17: 150 ug/h via INTRAVENOUS
  Filled 2013-01-15 (×4): qty 50

## 2013-01-15 MED ORDER — MIDAZOLAM HCL 2 MG/2ML IJ SOLN
2.0000 mg | Freq: Once | INTRAMUSCULAR | Status: AC
  Administered 2013-01-15: 2 mg via INTRAVENOUS

## 2013-01-15 MED ORDER — DEXMEDETOMIDINE HCL IN NACL 400 MCG/100ML IV SOLN
0.0000 ug/kg/h | INTRAVENOUS | Status: DC
  Administered 2013-01-15: 1 ug/kg/h via INTRAVENOUS
  Administered 2013-01-15: 0.8 ug/kg/h via INTRAVENOUS
  Administered 2013-01-15 – 2013-01-17 (×11): 1 ug/kg/h via INTRAVENOUS
  Filled 2013-01-15 (×3): qty 100
  Filled 2013-01-15 (×3): qty 50
  Filled 2013-01-15: qty 100
  Filled 2013-01-15 (×4): qty 50
  Filled 2013-01-15: qty 100
  Filled 2013-01-15 (×2): qty 50

## 2013-01-15 MED ORDER — HYDRALAZINE HCL 20 MG/ML IJ SOLN
10.0000 mg | INTRAMUSCULAR | Status: DC | PRN
  Administered 2013-01-15: 10 mg via INTRAVENOUS
  Administered 2013-01-16: 20 mg via INTRAVENOUS
  Filled 2013-01-15 (×2): qty 1

## 2013-01-15 MED ORDER — VITAL AF 1.2 CAL PO LIQD
1000.0000 mL | ORAL | Status: DC
  Administered 2013-01-15 – 2013-01-17 (×2): 1000 mL
  Filled 2013-01-15 (×2): qty 1000

## 2013-01-15 MED ORDER — FENTANYL BOLUS VIA INFUSION
25.0000 ug | INTRAVENOUS | Status: DC | PRN
  Filled 2013-01-15: qty 50

## 2013-01-15 MED ORDER — FENTANYL CITRATE 0.05 MG/ML IJ SOLN: 50.0000 ug | Freq: Once | INTRAMUSCULAR | Status: AC

## 2013-01-15 MED ORDER — ROCURONIUM BROMIDE 50 MG/5ML IV SOLN
INTRAVENOUS | Status: AC
  Filled 2013-01-15: qty 2

## 2013-01-15 MED ORDER — HALOPERIDOL LACTATE 5 MG/ML IJ SOLN
4.0000 mg | INTRAMUSCULAR | Status: AC
  Administered 2013-01-15: 4 mg via INTRAVENOUS

## 2013-01-15 MED ORDER — ETOMIDATE 2 MG/ML IV SOLN
20.0000 mg | Freq: Once | INTRAVENOUS | Status: AC
  Administered 2013-01-15: 20 mg via INTRAVENOUS
  Filled 2013-01-15: qty 10

## 2013-01-15 MED ORDER — SUCCINYLCHOLINE CHLORIDE 20 MG/ML IJ SOLN
INTRAMUSCULAR | Status: AC
  Filled 2013-01-15: qty 1

## 2013-01-15 MED ORDER — DEXMEDETOMIDINE HCL IN NACL 200 MCG/50ML IV SOLN
0.4000 ug/kg/h | INTRAVENOUS | Status: DC
  Administered 2013-01-15: 0.5 ug/kg/h via INTRAVENOUS
  Administered 2013-01-15: 1 ug/kg/h via INTRAVENOUS
  Filled 2013-01-15 (×2): qty 50

## 2013-01-15 MED ORDER — LORAZEPAM 2 MG/ML IJ SOLN
1.0000 mg | INTRAMUSCULAR | Status: AC
  Administered 2013-01-15: 1 mg via INTRAVENOUS
  Filled 2013-01-15: qty 1

## 2013-01-15 MED ORDER — ETOMIDATE 2 MG/ML IV SOLN
INTRAVENOUS | Status: AC
  Filled 2013-01-15: qty 20

## 2013-01-15 MED ORDER — FENTANYL CITRATE 0.05 MG/ML IJ SOLN
100.0000 ug | Freq: Once | INTRAMUSCULAR | Status: AC
  Administered 2013-01-15: 200 ug via INTRAVENOUS

## 2013-01-15 MED ORDER — HALOPERIDOL LACTATE 5 MG/ML IJ SOLN
INTRAMUSCULAR | Status: AC
  Filled 2013-01-15: qty 1

## 2013-01-15 MED ORDER — LIDOCAINE HCL (CARDIAC) 20 MG/ML IV SOLN
INTRAVENOUS | Status: AC
  Filled 2013-01-15: qty 5

## 2013-01-15 NOTE — Progress Notes (Signed)
Notified Debbie, sister, that patient required reintubation due to respiratory distress that did not respond to other interventions.

## 2013-01-15 NOTE — Progress Notes (Signed)
OGT placement reverified x-ray per protocol, due to questionable placement.

## 2013-01-15 NOTE — Plan of Care (Signed)
Problem: Phase II Progression Outcomes Goal: Date pt extubated/weaned off vent Outcome: Not Applicable Date Met:  01/15/13 01/15/13 Goal: Time pt extubated/weaned off vent Outcome: Completed/Met Date Met:  01/15/13 0918 am

## 2013-01-15 NOTE — Progress Notes (Signed)
Patient is restless and complaining of shob.  Notified Brett Canales Minor, NP. Ativan 1 mg IV ordered.  Bipap ordered. O2 sats never dropped below 92 % on 4L [place].  Respirations 30-35.  Heart rate 92.  Will continue to monitor.

## 2013-01-15 NOTE — Progress Notes (Signed)
Wasted 180 ml of fentanyl drip with Dallas Breeding, RN, Care Coordinator.  Fentanyl drip discontinued by MD.

## 2013-01-15 NOTE — Progress Notes (Signed)
Asked to reintubat by  elink  Procedure done  Plan cxr fent gtt + precedex gtt + versed prn Insert aline RASS -2   Dr. Kalman Shan, M.D., [facility].C.P Pulmonary and Critical Care Medicine Staff Physician [facility] Pager: [phone], If no answer or between  15:00h - 7:00h: call [phone]  01/15/2013 4:07 PM

## 2013-01-15 NOTE — Progress Notes (Signed)
Rass score 3.  Patient is agitated, restless and attempting to remove bipap mask.  O2 sats 96 % on Bipap 40%.  Respirations 30-35.  Notified Dr. Molli Knock. Precedex drip ordered.

## 2013-01-15 NOTE — Procedures (Signed)
Extubation Procedure Note  Patient Details:   Name: ADOLPHUS HANF DOB: Apr 12, 1955 MRN: [id number]   Airway Documentation:     Evaluation  O2 sats: stable throughout Complications: No apparent complications Patient did tolerate procedure well. Bilateral Breath Sounds: Diminished Suctioning: Airway Yes  Suzan Garibaldi 01/15/2013, 9:18 AM

## 2013-01-15 NOTE — Progress Notes (Signed)
CARE MANAGEMENT NOTE 01/15/2013  Patient:  VONTAE, COURT   Account Number:  [id number]  Date Initiated:  01/11/2013  Documentation initiated by:  DAVIS,RHONDA  Subjective/Objective Assessment:   [t with hx of copd and current smoker-increased wob tried on nebs and bipap progressive dyspnea and was intubated.     Action/Plan:   home when stable   Anticipated DC Date:  01/18/2013   Anticipated DC Plan:  HOME/SELF CARE         Choice offered to / List presented to:             Status of service:  In process, will continue to follow Medicare Important Message given?   (If response is "NO", the following Medicare IM given date fields will be blank) Date Medicare IM given:   Date Additional Medicare IM given:    Discharge Disposition:    Per UR Regulation:  Reviewed for med. necessity/level of care/duration of stay  If discussed at Long Length of Stay Meetings, dates discussed:    Comments:  [id number] Stark Jock, BSN, [place] [phone] Chart Reviewed for discharge and hospital needs.  Patient extubated am of 12262014/requiring 100% and is a high risk for reintubation and poss trach placement. Patient has Medicaid with no LACT benefits, will require snf placement or poss hhc. Review of patient progress due on [id number].    [id number] Earlene Plater, RN, BSN, [place] [phone] Chart Reviewed for discharge and hospital needs. Discharge needs at time of review:  None present will follow for needs. Review of patient progress due on [id number].

## 2013-01-15 NOTE — Procedures (Signed)
Intubation Procedure Note Taylor [name] [id number] 03-Jun-1955  Procedure: Intubation Indications: Respiratory insufficiency  Procedure Details Consent: Unable to obtain consent because of emergent medical necessity. Time Out: Verified patient identification, verified procedure, site/side was marked, verified correct patient position, special equipment/implants available, medications/allergies/relevent history reviewed, required imaging and test results available.  Performed  Maximum sterile technique was used including cap, gloves, gown and mask.  MAC  GLide scope used for intubation. Yellow-grey mucus seen around vocal cords  Evaluation Hemodynamic Status: BP stable throughout; O2 sats: stable throughout Patient's Current Condition: stable Complications: No apparent complications Patient did tolerate procedure well. Chest X-ray ordered to verify placement.  CXR: pending.   Drugs uses Fent Versed 2mg  Etomidate 20mg  Rocuronium 70mg    Maryam Feely 01/15/2013

## 2013-01-15 NOTE — Progress Notes (Signed)
Two Rt's attempted aline X2 each. Pt had no adverse reaction to procedure.  Dr. Marchelle Gearing notified. MD states don't worry about it. Rt will pass on to p.m. Shift.

## 2013-01-15 NOTE — Progress Notes (Signed)
Name: Taylor [name] MRN: [id number] DOB: [birth year]    ADMISSION DATE:  01/09/2013 CONSULTATION DATE:  01/10/2013  REFERRING MD :  Everett Graff  CHIEF COMPLAINT:  Can't breath  BRIEF PATIENT DESCRIPTION:  57 yo male smoker admit with AECOPD.  Developed VDRF 12/21 and PCCM assumed care.  Followed by Dr. Sherene Sires in pulmonary office for GOLD IV COPD with emphysema.   has a past medical history of Inguinal hernia; Depression; Hemorrhoids; Full dentures; Rib fractures; Bipolar 1 disorder (02/05/2012); Shortness of breath; COPD (chronic obstructive pulmonary disease); Cancer (04/11/11); and Colon cancer (04/11/2011).   has past surgical history that includes Colostomy revision (04/11/2011); Colon surgery (04/11/11); Portacath placement; Portacath placement (05/02/2011); Inguinal hernia repair (Right, 05/01/2012); Insertion of mesh (Right, 05/01/2012); and Port-a-cath removal (Left, 05/01/2012).   SIGNIFICANT EVENTS: 12/20 Admit to SDU 12/21 VDRF, PCCM assumed care 12-23 hypercarbic acidosis 12-24 failed weaning 12-26 attempt extubation  STUDIES:  PFT's 12/15/2012 >> FEV1 0.89 (24%) with Ratio 38 p am saba and DLCO 44% with 68  Echo 12/21 >> EF 50 to 55%, mild LVH  LINES / TUBES: ETT 12/22 >> 12-26  CULTURES: Influenza PCR 12/20 >> negative Sputum 12/22 > NORMAL FLORA 12-22 bc>> 12-20 uc>>neg 01/14/13 - RESP VIRUS PCR  (tracheal aspirate)     ANTIBIOTICS: Levaquin 12/20 >>  Tamiflu 12/22 >>  (12/26)    SUBJECTIVE:   12-26 awake and on PS  VITAL SIGNS: Temp:  [98 F (36.7 C)-99.4 F (37.4 C)] 98.9 F (37.2 C) (12/26 0800) Pulse Rate:  [58-108] 108 (12/26 0800) Resp:  [12-25] 24 (12/26 0800) BP: (106-163)/(60-95) 143/70 mmHg (12/26 0800) SpO2:  [91 %-99 %] 94 % (12/26 0800) FiO2 (%):  [30 %] 30 % (12/26 0848) Weight:  [191 lb 5.8 oz (86.8 kg)] 191 lb 5.8 oz (86.8 kg) (12/26 0500) HEMODYNAMICS:   VENTILATOR SETTINGS: Vent Mode:  [-] CPAP FiO2 (%):  [30 %] 30  % Set Rate:  [20 bmp] 20 bmp Vt Set:  [600 mL] 600 mL PEEP:  [5 cmH20] 5 cmH20 Pressure Support:  [5 cmH20] 5 cmH20 Plateau Pressure:  [19 cmH20-24 cmH20] 19 cmH20 INTAKE / OUTPUT: Intake/Output     12/25 0701 - 12/26 0700 12/26 0701 - 12/27 0700   I.V. (mL/kg) 933 (10.7) 27.5 (0.3)   NG/GT 1315 55   IV Piggyback 150    Total Intake(mL/kg) 2398 (27.6) 82.5 (1)   Urine (mL/kg/hr) 1610 (0.8) 125 (0.7)   Total Output 1610 125   Net +788 -42.5          PHYSICAL EXAMINATION: General:  Intubated. Sedated. Follows commands on 5/5 ps Neuro:  Sedated. Moves all 4 spontaneously. Agitated at times. HEENT:  Normal x for edentulous and presence of ET tube and OG tube Cardiovascular:   RRR  Lungs: Decreased BS throughout. No wheeze. Sync with vent Abdomen:  Soft. Nontender. Well-healed low-midline surgical scar. Decrease bowel sounds Extremities: Warm. Well perfused. No edema, cyanosis or clubbing    Musculoskeletal:  Normal tone and bulk. Skin:  No rash. Diaphoretic.      LABS: PULMONARY  Recent Labs Lab 01/10/13 2300 01/11/13 0222 01/12/13 0500 01/12/13 0605 01/13/13 0351  PHART 7.199* 7.203* 7.149* 7.267* 7.423  PCO2ART 68.3* 65.3* 82.7* 62.5* 44.1  PO2ART 69.7* 142.0* 118.0* 74.9* 112.0*  HCO3 25.6* 24.7* 27.5* 27.6* 28.3*  TCO2 23.1 22.5 25.8 25.2 25.1  O2SAT 91.1 98.7 97.7 94.5 98.4    CBC  Recent Labs Lab 01/13/13 0330 01/14/13 0325  01/15/13 0340  HGB 12.9* 13.4 12.8*  HCT 40.4 40.9 40.6  WBC 7.9 6.9 6.4  PLT 140* 149* 167    COAGULATION No results found for this basename: INR,  in the last 168 hours  CARDIAC    Recent Labs Lab 01/10/13 0651 01/10/13 1203 01/11/13 1142 01/11/13 1654 01/11/13 2320  TROPONINI <0.30 <0.30 <0.30 <0.30 <0.30    Recent Labs Lab 01/15/13 0340  PROBNP 498.0*     CHEMISTRY  Recent Labs Lab 01/09/13 1520 01/10/13 0015 01/11/13 0228 01/12/13 0344 01/13/13 0330 01/14/13 0325 01/15/13 0340  NA 131* 130*  135 140 142 145 147*  K 4.2 3.9 5.2* 5.2* 4.6 4.9 4.2  CL 94* 93* 99 105 108 110 110  CO2 26 24 23 27 28 26  32  GLUCOSE 127* 291* 180* 143* 151* 175* 153*  BUN 14 21 28* 54* 52* 46* 45*  CREATININE 1.03 1.21 1.40* 1.59* 0.99 0.69 0.75  CALCIUM 8.8 8.7 8.7 8.5 8.6 8.3* 8.3*  MG  --  2.1  --  2.8* 3.1*  --  2.7*  PHOS  --  3.1  --  3.0 1.9*  --  2.0*   Estimated Creatinine Clearance: 107.2 ml/min (by C-G formula based on Cr of 0.75).   LIVER  Recent Labs Lab 01/09/13 1520 01/10/13 0015  AST 27 26  ALT 17 18  ALKPHOS 82 70  BILITOT 0.5 0.4  PROT 7.2 6.8  ALBUMIN 4.0 3.6     INFECTIOUS  Recent Labs Lab 01/15/13 0340  LATICACIDVEN 1.2     ENDOCRINE CBG (last 3)   Recent Labs  01/15/13 0044 01/15/13 0414 01/15/13 0729  GLUCAP 160* 173* 161*         IMAGING x48h  Dg Chest Port 1 View  01/15/2013   CLINICAL DATA:  57 year old male with acute respiratory failure. Chest pain. Initial encounter. Intubated.  EXAM: PORTABLE CHEST - 1 VIEW  COMPARISON:  01/14/2013 and earlier.  FINDINGS: Portable AP view at 0505 hr. Stable endotracheal tube tip in good position between the level the clavicles and carina. Enteric tube courses to the level of the stomach, tip not included. No pneumothorax or pulmonary edema. No pleural effusion or confluent pulmonary opacity. Normal cardiac size and mediastinal contours.  IMPRESSION: 1. Stable lines and tubes.  2. No acute cardiopulmonary abnormality.   Electronically Signed   By: Augusto Gamble M.D.   On: 01/15/2013 07:29   Dg Chest Port 1 View  01/14/2013   CLINICAL DATA:  Hypoxia  EXAM: PORTABLE CHEST - 1 VIEW  COMPARISON:  Study obtained earlier in the day  FINDINGS: Endotracheal tube tip is 4.6 cm above the carina. Nasogastric tube tip and side port are below the stomach. No pneumothorax.  There is no edema or consolidation. Heart size and pulmonary vascularity are normal. No adenopathy. There old healed rib fractures on the right.   IMPRESSION: Tube positions as described. No pneumothorax. No edema or consolidation.   Electronically Signed   By: Bretta Bang M.D.   On: 01/14/2013 12:20   Dg Chest Port 1 View  01/14/2013   CLINICAL DATA:  Intubated patient.  EXAM: PORTABLE CHEST - 1 VIEW  COMPARISON:  01/14/2012  FINDINGS: Heart, mediastinum and hila are unremarkable.  Lungs are mildly hyperexpanded but clear. No pleural effusion or pneumothorax.  Endotracheal tube tip lies 5.5 cm above the carina nasogastric tube passes below the diaphragm into the stomach.  IMPRESSION: No acute cardiopulmonary disease.  Support apparatus is  stable in well positioned.   Electronically Signed   By: Amie Portland M.D.   On: 01/14/2013 07:13   Dg Abd Portable 1v  01/14/2013   CLINICAL DATA:  Status post orogastric tube placement.  EXAM: PORTABLE ABDOMEN - 1 VIEW  COMPARISON:  Abdominal film of 12 January 2013.  FINDINGS: The orogastric tube tip lies in the region of the distal stomach similar to that previously demonstrated. The proximal port lies below the expected location of the GE junction. The small and large bowel gas pattern is within the limits of normal. No free extraluminal gas collections are demonstrated. The observed bony structures appear normal.  IMPRESSION: The orogastric tube tip lies in the region of the distal gastric body or antral region.   Electronically Signed   By: David  [place]   On: 01/14/2013 12:21     ASSESSMENT / PLAN:  PULMONARY A: Acute hypoxic/hypercapnic respiratory failure 2nd to AECOPD. Possible influenza. Continued tobacco abuse.     P:   - decrease sedation - solumedrol regimen - Continue scheduled BD's but increased to q4h -Monitor closely; if extubated, extubate to bipap given severe copd  CARDIOVASCULAR A:  Sinus tachycardia >> likely related to acute respiratory distress. Chest pain on admission >> cardiac enzyme negative, ECG and echo unremarkable. P:  - Monitor hemodynamics -  Maintain hydration    RENAL A:   Mild hyponatremia. Resolved P:   - Monitor electrolytes - Fluids rdyuce to KVP  -GASTROINTESTINAL A:   On TF since 01/12/13  01/14/13 - constipated . P:   - Add tube feeds 12-23 - Protonix for SUP - colace - add senna  HEMATOLOGIC A:   Mild thrombocytopenia >> chronic, secondary to chemotherapy for hx of colon cancer. P:  - F/u CBC - Continue lovenox for DVT prevention  INFECTIOUS A:   AECOPD. High clinical suspicion for influenza even with negative influenza PCR. Also other virus related AECOPD possible   P:   - Continue levaquin - Continue tamiflu 12/22 x 5 days - Continue droplet isolation until clinically improved, and no longer febrile   ENDOCRINE CBG (last 3)   Recent Labs  01/15/13 0044 01/15/13 0414 01/15/13 0729  GLUCAP 160* 173* 161*     A:   Steroid induced hyperglycemia. P:   - SSI while on solumedrol  NEUROLOGIC A:   Hx of Bipolar disorder, binge alcohol consumption >> on chronic benzo's as outpt. P:   - Sedation protocol with propofol, fentanyl and prn versed >> goal RASS -2 until respiratory status more stable - Continue abilify, lexapro - Continue thiamine, folic acid -12-24 low dose klonopin for agitation  Global:   01/13/13: Not currently weanable unless code status changed. Add low dose oral sedation to help with agitation.  01/04/13: No family at bedside. Advance ET tube and see if cough will go away to help extubation 01/15/13. If extubatable, exubate to bipap  12-26  Extubation may require reintubation.  Brett Canales Minor ACNP [facility] Pollack PCCM Pager [phone] till 3 pm If no answer page [phone] 01/15/2013, 9:14 AM  Will extubate today, if fails will reintubate.  Once family arrives will need to discuss code status.  CC time 35 min.  Patient seen and examined, agree with above note.  I dictated the care and orders written for this patient under my direction.  Alyson Reedy,  MD [phone]

## 2013-01-15 NOTE — Progress Notes (Signed)
Pt has elevated B/P, E-link, MD made aware.  New orders received and initiated.  Will continue to monitor and report.

## 2013-01-15 NOTE — Progress Notes (Signed)
Patient wheezing, restless and agitated.  Notified Dr. Molli Knock.  Haldol 4 mg IV administer per MD order.  Patient is resting but still wheezing.  O2 sats 95 % on 4LNC.  Respirationss 25, BP 188/89, heart rate 89.  Will continue to monitor.

## 2013-01-15 NOTE — Progress Notes (Signed)
eLink Physician-Brief Progress Note Patient Name: Taylor [name] DOB: [birth year] MRN: [id number]  Date of Service  01/15/2013   HPI/Events of Note  High BP Pain controlled, no agitation on precedex  eICU Interventions  Hydralazine prn   Intervention Category Intermediate Interventions: Hypertension - evaluation and management  ALVA,RAKESH V. 01/15/2013, 10:04 PM

## 2013-01-15 NOTE — Progress Notes (Signed)
OGT advanced 10cm. And auscultation done, also.

## 2013-01-16 ENCOUNTER — Inpatient Hospital Stay (HOSPITAL_COMMUNITY): Payer: Medicaid Other

## 2013-01-16 DIAGNOSIS — J208 Acute bronchitis due to other specified organisms: Secondary | ICD-10-CM | POA: Diagnosis present

## 2013-01-16 LAB — CBC WITH DIFFERENTIAL/PLATELET
Basophils Relative: 0 % (ref 0–1)
Eosinophils Absolute: 0 10*3/uL (ref 0.0–0.7)
Lymphs Abs: 0.6 10*3/uL — ABNORMAL LOW (ref 0.7–4.0)
MCH: 32.4 pg (ref 26.0–34.0)
MCHC: 31.5 g/dL (ref 30.0–36.0)
Neutrophils Relative %: 82 % — ABNORMAL HIGH (ref 43–77)
Platelets: 155 10*3/uL (ref 150–400)
RBC: 4.14 MIL/uL — ABNORMAL LOW (ref 4.22–5.81)
WBC: 6.4 10*3/uL (ref 4.0–10.5)

## 2013-01-16 LAB — BASIC METABOLIC PANEL
GFR calc Af Amer: >90 mL/min (ref >90)
GFR calc non Af Amer: >90 mL/min (ref >90)
Glucose, Bld: 160 mg/dL — ABNORMAL HIGH (ref 70–99)
Potassium: 4.3 mEq/L (ref 3.5–5.1)
Sodium: 143 mEq/L (ref 135–145)

## 2013-01-16 LAB — GLUCOSE, CAPILLARY
Glucose-Capillary: 127 mg/dL — ABNORMAL HIGH (ref 70–99)
Glucose-Capillary: 167 mg/dL — ABNORMAL HIGH (ref 70–99)
Glucose-Capillary: 205 mg/dL — ABNORMAL HIGH (ref 70–99)

## 2013-01-16 LAB — MAGNESIUM: Magnesium: 2.5 mg/dL (ref 1.5–2.5)

## 2013-01-16 MED ORDER — ALBUTEROL SULFATE (5 MG/ML) 0.5% IN NEBU
2.5000 mg | INHALATION_SOLUTION | RESPIRATORY_TRACT | Status: DC
  Administered 2013-01-16 – 2013-01-18 (×11): 2.5 mg via RESPIRATORY_TRACT
  Filled 2013-01-16 (×12): qty 0.5

## 2013-01-16 MED ORDER — IPRATROPIUM BROMIDE 0.02 % IN SOLN
0.5000 mg | Freq: Four times a day (QID) | RESPIRATORY_TRACT | Status: DC
  Administered 2013-01-16 – 2013-01-18 (×8): 0.5 mg via RESPIRATORY_TRACT
  Filled 2013-01-16 (×8): qty 2.5

## 2013-01-16 MED ORDER — FUROSEMIDE 10 MG/ML IJ SOLN
40.0000 mg | Freq: Three times a day (TID) | INTRAMUSCULAR | Status: AC
  Administered 2013-01-16 (×2): 40 mg via INTRAVENOUS
  Filled 2013-01-16 (×2): qty 4

## 2013-01-16 NOTE — Progress Notes (Signed)
Name: Taylor [name] MRN: [id number] DOB: [birth year]    ADMISSION DATE:  01/09/2013 CONSULTATION DATE:  01/10/2013  REFERRING MD :  Everett Graff  CHIEF COMPLAINT:  Can't breath  BRIEF PATIENT DESCRIPTION:  57 yo male smoker admit with AECOPD.  Developed VDRF 12/21 and PCCM assumed care.  Followed by Dr. Sherene Sires in pulmonary office for GOLD IV COPD with emphysema.   SIGNIFICANT EVENTS: 12/20 Admit to SDU 12/21 VDRF, PCCM assumed care 12-23 hypercarbic acidosis 12-24 failed weaning 12-26 attempt extubation failed and reintubated   STUDIES:  PFT's 12/15/2012 >> FEV1 0.89 (24%) with Ratio 38 p am saba and DLCO 44% with 68  Echo 12/21 >> EF 50 to 55%, mild LVH  LINES / TUBES: ETT 12/22 >> 12-26>>12/26 reintubated>>  CULTURES: Influenza PCR 12/20 >> negative Sputum 12/22 > NORMAL FLORA 12-22 bc>>NEG 12-20 uc>>55K lactobacillus 01/14/13 - RESP VIRUS PCR  (tracheal aspirate) >>meta pneumovirus    ANTIBIOTICS: Levaquin 12/20 >>  Tamiflu 12/22 >>  (12/26)    SUBJECTIVE:  Sedated on vent  VITAL SIGNS: Temp:  [98.2 F (36.8 C)-98.9 F (37.2 C)] 98.7 F (37.1 C) (12/27 0400) Pulse Rate:  [61-111] 72 (12/27 0600) Resp:  [0-33] 16 (12/27 0600) BP: (132-209)/(68-147) 141/83 mmHg (12/27 0600) SpO2:  [85 %-100 %] 97 % (12/27 0600) FiO2 (%):  [30 %-100 %] 40 % (12/27 0747) Weight:  [85.2 kg (187 lb 13.3 oz)] 85.2 kg (187 lb 13.3 oz) (12/27 0400) HEMODYNAMICS: cv stable   VENTILATOR SETTINGS: Vent Mode:  [-] PRVC FiO2 (%):  [30 %-100 %] 40 % Set Rate:  [12 bmp-20 bmp] 12 bmp Vt Set:  [600 mL] 600 mL PEEP:  [5 cmH20] 5 cmH20 Pressure Support:  [5 cmH20] 5 cmH20 Plateau Pressure:  [14 cmH20-21 cmH20] 17 cmH20 INTAKE / OUTPUT: Intake/Output     12/26 0701 - 12/27 0700 12/27 0701 - 12/28 0700   I.V. (mL/kg) 964.4 (11.3)    NG/GT 232    IV Piggyback     Total Intake(mL/kg) 1196.4 (14)    Urine (mL/kg/hr) 2225 (1.1)    Emesis/NG output 100 (0)    Total  Output 2325     Net -1128.6          Stool Occurrence 1 x      PHYSICAL EXAMINATION: General:  Intubated. Sedated. Follows commands Neuro:  Sedated. Moves all 4 spontaneously. Agitated at times. HEENT:  Normal x for edentulous and presence of ET tube and OG tube Cardiovascular:   RRR  Lungs: Decreased BS throughout. No wheeze. Sync with vent Abdomen:  Soft. Nontender. Well-healed low-midline surgical scar. Decrease bowel sounds Extremities: Warm. Well perfused. No edema, cyanosis or clubbing    Musculoskeletal:  Normal tone and bulk. Skin:  No rash. Diaphoretic.      LABS: PULMONARY  Recent Labs Lab 01/11/13 0222 01/12/13 0500 01/12/13 0605 01/13/13 0351 01/15/13 1729  PHART 7.203* 7.149* 7.267* 7.423 7.427  PCO2ART 65.3* 82.7* 62.5* 44.1 55.6*  PO2ART 142.0* 118.0* 74.9* 112.0* 70.2*  HCO3 24.7* 27.5* 27.6* 28.3* 36.0*  TCO2 22.5 25.8 25.2 25.1 32.2  O2SAT 98.7 97.7 94.5 98.4 94.7    CBC  Recent Labs Lab 01/14/13 0325 01/15/13 0340 01/16/13 0510  HGB 13.4 12.8* 13.4  HCT 40.9 40.6 42.5  WBC 6.9 6.4 6.4  PLT 149* 167 155    COAGULATION No results found for this basename: INR,  in the last 168 hours  CARDIAC    Recent Labs Lab 01/10/13 [phone]  01/10/13 1203 01/11/13 1142 01/11/13 1654 01/11/13 2320  TROPONINI <0.30 <0.30 <0.30 <0.30 <0.30    Recent Labs Lab 01/15/13 0340  PROBNP 498.0*     CHEMISTRY  Recent Labs Lab 01/10/13 0015  01/12/13 0344 01/13/13 0330 01/14/13 0325 01/15/13 0340 01/16/13 0510  NA 130*  < > 140 142 145 147* 143  K 3.9  < > 5.2* 4.6 4.9 4.2 4.3  CL 93*  < > 105 108 110 110 102  CO2 24  < > 27 28 26  32 36*  GLUCOSE 291*  < > 143* 151* 175* 153* 160*  BUN 21  < > 54* 52* 46* 45* 36*  CREATININE 1.21  < > 1.59* 0.99 0.69 0.75 0.64  CALCIUM 8.7  < > 8.5 8.6 8.3* 8.3* 8.8  MG 2.1  --  2.8* 3.1*  --  2.7* 2.5  PHOS 3.1  --  3.0 1.9*  --  2.0* 2.4  < > = values in this interval not displayed. Estimated  Creatinine Clearance: 107.2 ml/min (by C-G formula based on Cr of 0.64).   LIVER  Recent Labs Lab 01/09/13 1520 01/10/13 0015  AST 27 26  ALT 17 18  ALKPHOS 82 70  BILITOT 0.5 0.4  PROT 7.2 6.8  ALBUMIN 4.0 3.6     INFECTIOUS  Recent Labs Lab 01/15/13 0340  LATICACIDVEN 1.2     ENDOCRINE CBG (last 3)   Recent Labs  01/15/13 1740 01/15/13 1944 01/15/13 2311  GLUCAP 143* 156* 111*         IMAGING x48h  Dg Chest Port 1 View  01/15/2013   CLINICAL DATA:  ET tube placement.  EXAM: PORTABLE CHEST - 1 VIEW  COMPARISON:  Chest 01/15/2013.  FINDINGS: Endotracheal tube is in place with the tip 2.2 cm above the carina. The tube could be withdrawn approximately 2 cm for better positioning. NG tube courses into the stomach and below the inferior margin of the film. Lungs are clear. Heart size is normal. No pneumothorax or pleural effusion. Remote right rib fractures are noted.  IMPRESSION: ET tube tip is approximately 2.2 cm above the carina. The tube could be withdrawn approximately 2 cm for better positioning.  Lungs are clear.   Electronically Signed   By: Drusilla Kanner M.D.   On: 01/15/2013 16:40   Dg Chest Port 1 View  01/15/2013   CLINICAL DATA:  57 year old male with acute respiratory failure. Chest pain. Initial encounter. Intubated.  EXAM: PORTABLE CHEST - 1 VIEW  COMPARISON:  01/14/2013 and earlier.  FINDINGS: Portable AP view at 0505 hr. Stable endotracheal tube tip in good position between the level the clavicles and carina. Enteric tube courses to the level of the stomach, tip not included. No pneumothorax or pulmonary edema. No pleural effusion or confluent pulmonary opacity. Normal cardiac size and mediastinal contours.  IMPRESSION: 1. Stable lines and tubes.  2. No acute cardiopulmonary abnormality.   Electronically Signed   By: Augusto Gamble M.D.   On: 01/15/2013 07:29   Dg Chest Port 1 View  01/14/2013   CLINICAL DATA:  Hypoxia  EXAM: PORTABLE CHEST - 1 VIEW   COMPARISON:  Study obtained earlier in the day  FINDINGS: Endotracheal tube tip is 4.6 cm above the carina. Nasogastric tube tip and side port are below the stomach. No pneumothorax.  There is no edema or consolidation. Heart size and pulmonary vascularity are normal. No adenopathy. There old healed rib fractures on the right.  IMPRESSION: Tube positions  as described. No pneumothorax. No edema or consolidation.   Electronically Signed   By: Bretta Bang M.D.   On: 01/14/2013 12:20   Dg Abd Portable 1v  01/15/2013   CLINICAL DATA:  Orogastric tube placement.  EXAM: PORTABLE ABDOMEN - 1 VIEW  COMPARISON:  01/14/2013  FINDINGS: Orogastric tube tip passes to the GE junction into the proximal stomach. Consider inserting an additional 10 cm to allow the tip to extend into the mid to distal stomach.  IMPRESSION: Orogastric tube tip lies in the proximal stomach.   Electronically Signed   By: Amie Portland M.D.   On: 01/15/2013 21:50   Dg Abd Portable 1v  01/14/2013   CLINICAL DATA:  Status post orogastric tube placement.  EXAM: PORTABLE ABDOMEN - 1 VIEW  COMPARISON:  Abdominal film of 12 January 2013.  FINDINGS: The orogastric tube tip lies in the region of the distal stomach similar to that previously demonstrated. The proximal port lies below the expected location of the GE junction. The small and large bowel gas pattern is within the limits of normal. No free extraluminal gas collections are demonstrated. The observed bony structures appear normal.  IMPRESSION: The orogastric tube tip lies in the region of the distal gastric body or antral region.   Electronically Signed   By: David  [place]   On: 01/14/2013 12:21     ASSESSMENT / PLAN:  PULMONARY A: Acute hypoxic/hypercapnic respiratory failure 2nd to AECOPD.  Metapneumovirus pos  Continued tobacco abuse.  P:   - adjust BDs -reduce I time on vent to 0.65 for less air trapping - solumedrol regimen -failed extub 12/26>>may need trach and LTAC  care   CARDIOVASCULAR A:  Sinus tachycardia >> likely related to acute respiratory distress. Chest pain on admission >> cardiac enzyme negative, ECG and echo unremarkable. P:  - Monitor hemodynamics - Maintain hydration    RENAL A:   Mild hyponatremia. Resolved P:   - Monitor electrolytes   -GASTROINTESTINAL A:   On TF since 01/12/13 . P:   -cont TF - Protonix for SUP - colace - add senna  HEMATOLOGIC A:   Mild thrombocytopenia >> chronic, secondary to chemotherapy for hx of colon cancer. P:  - F/u CBC - Continue lovenox for DVT prevention  INFECTIOUS A:   AECOPD. Metapneumovirus POS .    P:   - Continue levaquin - Continue tamiflu 12/22 x 5 days - Continue droplet isolation until clinically improved, and no longer febrile   ENDOCRINE CBG (last 3)   Recent Labs  01/15/13 1740 01/15/13 1944 01/15/13 2311  GLUCAP 143* 156* 111*     A:   Steroid induced hyperglycemia. P:   - SSI while on solumedrol  NEUROLOGIC A:   Hx of Bipolar disorder, binge alcohol consumption >> on chronic benzo's as outpt. P:   - Sedation protocol with propofol, fentanyl and prn versed >> goal RASS -2 until respiratory status more stable - Continue abilify, lexapro - Continue thiamine, folic acid -12-24 low dose klonopin for agitation  CC17min Global:  Failed extubation 12/26. Cont full vent today. Adjust BDs May need trach  01/16/2013, 7:55 AM  Storm Frisk, MD Beeper  [phone]  Cell  [phone]  If no response or cell goes to voicemail, call beeper [phone]

## 2013-01-17 ENCOUNTER — Inpatient Hospital Stay (HOSPITAL_COMMUNITY): Payer: Medicaid Other

## 2013-01-17 DIAGNOSIS — J9600 Acute respiratory failure, unspecified whether with hypoxia or hypercapnia: Secondary | ICD-10-CM

## 2013-01-17 LAB — CBC WITH DIFFERENTIAL/PLATELET
Basophils Relative: 0 % (ref 0–1)
Eosinophils Relative: 0 % (ref 0–5)
HCT: 43 % (ref 39.0–52.0)
Lymphocytes Relative: 11 % — ABNORMAL LOW (ref 12–46)
Lymphs Abs: 0.8 10*3/uL (ref 0.7–4.0)
MCHC: 33 g/dL (ref 30.0–36.0)
MCV: 100.5 fL — ABNORMAL HIGH (ref 78.0–100.0)
Monocytes Absolute: 0.7 10*3/uL (ref 0.1–1.0)
RBC: 4.28 MIL/uL (ref 4.22–5.81)
RDW: 13 % (ref 11.5–15.5)
WBC: 7.5 10*3/uL (ref 4.0–10.5)

## 2013-01-17 LAB — GLUCOSE, CAPILLARY
Glucose-Capillary: 204 mg/dL — ABNORMAL HIGH (ref 70–99)
Glucose-Capillary: 116 mg/dL — ABNORMAL HIGH (ref 70–99)

## 2013-01-17 LAB — BASIC METABOLIC PANEL
CO2: 36 mEq/L — ABNORMAL HIGH (ref 19–32)
Calcium: 8.7 mg/dL (ref 8.4–10.5)
Chloride: 98 mEq/L (ref 96–112)
Creatinine, Ser: 0.71 mg/dL (ref 0.50–1.35)
GFR calc Af Amer: >90 mL/min (ref >90)
Glucose, Bld: 176 mg/dL — ABNORMAL HIGH (ref 70–99)
Sodium: 141 mEq/L (ref 135–145)

## 2013-01-17 MED ORDER — FUROSEMIDE 10 MG/ML IJ SOLN
40.0000 mg | Freq: Once | INTRAMUSCULAR | Status: AC
  Administered 2013-01-17: 40 mg via INTRAVENOUS
  Filled 2013-01-17: qty 4

## 2013-01-17 MED ORDER — ARIPIPRAZOLE 2 MG PO TABS
2.0000 mg | ORAL_TABLET | Freq: Every day | ORAL | Status: DC
  Administered 2013-01-18 – 2013-01-22 (×5): 2 mg via ORAL
  Filled 2013-01-17 (×5): qty 1

## 2013-01-17 MED ORDER — FOLIC ACID 1 MG PO TABS
1.0000 mg | ORAL_TABLET | Freq: Every day | ORAL | Status: DC
  Administered 2013-01-18 – 2013-01-22 (×5): 1 mg via ORAL
  Filled 2013-01-17 (×5): qty 1

## 2013-01-17 MED ORDER — PANTOPRAZOLE SODIUM 40 MG PO TBEC
40.0000 mg | DELAYED_RELEASE_TABLET | Freq: Every day | ORAL | Status: DC
  Administered 2013-01-17 – 2013-01-22 (×6): 40 mg via ORAL
  Filled 2013-01-17 (×6): qty 1

## 2013-01-17 MED ORDER — METHYLPREDNISOLONE SODIUM SUCC 125 MG IJ SOLR
60.0000 mg | Freq: Two times a day (BID) | INTRAMUSCULAR | Status: DC
  Filled 2013-01-17 (×2): qty 0.96

## 2013-01-17 MED ORDER — ESCITALOPRAM OXALATE 20 MG PO TABS
20.0000 mg | ORAL_TABLET | Freq: Every day | ORAL | Status: DC
  Administered 2013-01-18 – 2013-01-22 (×5): 20 mg via ORAL
  Filled 2013-01-17 (×5): qty 1

## 2013-01-17 MED ORDER — METHYLPREDNISOLONE SODIUM SUCC 40 MG IJ SOLR
40.0000 mg | Freq: Two times a day (BID) | INTRAMUSCULAR | Status: DC
  Administered 2013-01-17 – 2013-01-18 (×2): 40 mg via INTRAVENOUS
  Filled 2013-01-17 (×4): qty 1

## 2013-01-17 MED ORDER — FENTANYL CITRATE 0.05 MG/ML IJ SOLN: 12.5000 ug | INTRAMUSCULAR | Status: DC | PRN

## 2013-01-17 MED ORDER — VITAMIN B-1 100 MG PO TABS
100.0000 mg | ORAL_TABLET | Freq: Every day | ORAL | Status: DC
  Administered 2013-01-18 – 2013-01-22 (×5): 100 mg via ORAL
  Filled 2013-01-17 (×5): qty 1

## 2013-01-17 NOTE — Progress Notes (Signed)
Wasted 82ml's of Fentanyl from gtt.  Ballard Russell, RN witnessed waste.  Ok Edwards, RN

## 2013-01-17 NOTE — Progress Notes (Signed)
Name: Taylor [name] MRN: [id number] DOB: 01/09/1956    ADMISSION DATE:  01/09/2013 CONSULTATION DATE:  01/10/2013  REFERRING MD :  Everett Graff  CHIEF COMPLAINT:  Can't breath  BRIEF PATIENT DESCRIPTION:  57 yo male smoker admit with AECOPD.  Developed VDRF 12/21 and PCCM assumed care.  Followed by Dr. Sherene Sires in pulmonary office for GOLD IV COPD with emphysema.   SIGNIFICANT EVENTS: 12/20 Admit to SDU 12/21 VDRF, PCCM assumed care 12-23 hypercarbic acidosis 12-24 failed weaning 12-26 attempt extubation failed and reintubated   STUDIES:  PFT's 12/15/2012 >> FEV1 0.89 (24%) with Ratio 38 p am saba and DLCO 44% with 68  Echo 12/21 >> EF 50 to 55%, mild LVH  LINES / TUBES: ETT 12/22 >> 12-26>>12/26 reintubated>>  CULTURES: Influenza PCR 12/20 >> negative Sputum 12/22 > NORMAL FLORA 12-22 bc>>NEG 12-20 uc>>55K lactobacillus 01/14/13 - RESP VIRUS PCR  (tracheal aspirate) >>meta pneumovirus    ANTIBIOTICS: Levaquin 12/20 >>  Tamiflu 12/22 >>  (12/26)    SUBJECTIVE:  Awake, on PSV 8/5 weaning well  VITAL SIGNS: Temp:  [98.8 F (37.1 C)-100.4 F (38 C)] 98.9 F (37.2 C) (12/28 0800) Pulse Rate:  [66-80] 72 (12/28 0800) Resp:  [10-26] 12 (12/28 0800) BP: (121-172)/(69-96) 150/90 mmHg (12/28 0800) SpO2:  [95 %-99 %] 99 % (12/28 0800) FiO2 (%):  [40 %] 40 % (12/28 0800) Weight:  [82 kg (180 lb 12.4 oz)] 82 kg (180 lb 12.4 oz) (12/28 0500) HEMODYNAMICS: cv stable   VENTILATOR SETTINGS: Vent Mode:  [-] PRVC FiO2 (%):  [40 %] 40 % Set Rate:  [12 bmp] 12 bmp Vt Set:  [600 mL] 600 mL PEEP:  [5 cmH20] 5 cmH20 Pressure Support:  [8 cmH20] 8 cmH20 Plateau Pressure:  [15 cmH20-23 cmH20] 23 cmH20 INTAKE / OUTPUT: Intake/Output     12/27 0701 - 12/28 0700 12/28 0701 - 12/29 0700   I.V. (mL/kg) 1199 (14.6) 76.7 (0.9)   NG/GT 1075 55   IV Piggyback 150    Total Intake(mL/kg) 2424 (29.6) 131.7 (1.6)   Urine (mL/kg/hr) 5175 (2.6) 250 (1.8)   Emesis/NG  output     Total Output 5175 250   Net -2751 -118.3          PHYSICAL EXAMINATION: General:  Intubated.. Follows commands Neuro: RASS 0 HEENT:  Normal x for edentulous and presence of ET tube and OG tube Cardiovascular:   RRR  Lungs: Decreased BS throughout. No wheeze. Sync with vent Abdomen:  Soft. Nontender. Well-healed low-midline surgical scar. Decrease bowel sounds Extremities: Warm. Well perfused. No edema, cyanosis or clubbing    Musculoskeletal:  Normal tone and bulk. Skin:  No rash. Diaphoretic.      LABS: PULMONARY  Recent Labs Lab 01/11/13 0222 01/12/13 0500 01/12/13 0605 01/13/13 0351 01/15/13 1729  PHART 7.203* 7.149* 7.267* 7.423 7.427  PCO2ART 65.3* 82.7* 62.5* 44.1 55.6*  PO2ART 142.0* 118.0* 74.9* 112.0* 70.2*  HCO3 24.7* 27.5* 27.6* 28.3* 36.0*  TCO2 22.5 25.8 25.2 25.1 32.2  O2SAT 98.7 97.7 94.5 98.4 94.7    CBC  Recent Labs Lab 01/15/13 0340 01/16/13 0510 01/17/13 0348  HGB 12.8* 13.4 14.2  HCT 40.6 42.5 43.0  WBC 6.4 6.4 7.5  PLT 167 155 151    COAGULATION No results found for this basename: INR,  in the last 168 hours  CARDIAC    Recent Labs Lab 01/10/13 1203 01/11/13 1142 01/11/13 1654 01/11/13 2320  TROPONINI <0.30 <0.30 <0.30 <0.30  Recent Labs Lab 01/15/13 0340  PROBNP 498.0*     CHEMISTRY  Recent Labs Lab 01/12/13 0344 01/13/13 0330 01/14/13 0325 01/15/13 0340 01/16/13 0510 01/17/13 0348  NA 140 142 145 147* 143 141  K 5.2* 4.6 4.9 4.2 4.3 3.8  CL 105 108 110 110 102 98  CO2 27 28 26  32 36* 36*  GLUCOSE 143* 151* 175* 153* 160* 176*  BUN 54* 52* 46* 45* 36* 40*  CREATININE 1.59* 0.99 0.69 0.75 0.64 0.71  CALCIUM 8.5 8.6 8.3* 8.3* 8.8 8.7  MG 2.8* 3.1*  --  2.7* 2.5  --   PHOS 3.0 1.9*  --  2.0* 2.4  --    Estimated Creatinine Clearance: 107.2 ml/min (by C-G formula based on Cr of 0.71).   LIVER No results found for this basename: AST, ALT, ALKPHOS, BILITOT, PROT, ALBUMIN, INR,  in the last  168 hours   INFECTIOUS  Recent Labs Lab 01/15/13 0340  LATICACIDVEN 1.2     ENDOCRINE CBG (last 3)   Recent Labs  01/16/13 2010 01/17/13 0501 01/17/13 0740  GLUCAP 205* 115* 209*         IMAGING x48h  Dg Chest Port 1 View  01/17/2013   CLINICAL DATA:  Check endotracheal tube  EXAM: PORTABLE CHEST - 1 VIEW  COMPARISON:  Tubular device is stable. Lungs clear. Normal heart size.  FINDINGS: The heart size and mediastinal contours are within normal limits. Both lungs are clear. The visualized skeletal structures are unremarkable.  IMPRESSION: Stable.  Clear lungs.   Electronically Signed   By: Maryclare Bean M.D.   On: 01/17/2013 08:40   Dg Chest Port 1 View  01/16/2013   CLINICAL DATA:  Hypoxia  EXAM: PORTABLE CHEST - 1 VIEW  COMPARISON:  January 15, 2013  FINDINGS: Endotracheal tube tip is 4.7 cm above the carina. Nasogastric tube tip and side port are below the diaphragm. No pneumothorax.  There is no edema or consolidation. Heart size and pulmonary vascularity are normal. No adenopathy. There old healed rib fractures on the right.  IMPRESSION: Tube positions as described without pneumothorax. No edema or consolidation.   Electronically Signed   By: Bretta Bang M.D.   On: 01/16/2013 08:01   Dg Chest Port 1 View  01/15/2013   CLINICAL DATA:  ET tube placement.  EXAM: PORTABLE CHEST - 1 VIEW  COMPARISON:  Chest 01/15/2013.  FINDINGS: Endotracheal tube is in place with the tip 2.2 cm above the carina. The tube could be withdrawn approximately 2 cm for better positioning. NG tube courses into the stomach and below the inferior margin of the film. Lungs are clear. Heart size is normal. No pneumothorax or pleural effusion. Remote right rib fractures are noted.  IMPRESSION: ET tube tip is approximately 2.2 cm above the carina. The tube could be withdrawn approximately 2 cm for better positioning.  Lungs are clear.   Electronically Signed   By: Drusilla Kanner M.D.   On: 01/15/2013  16:40   Dg Abd Portable 1v  01/15/2013   CLINICAL DATA:  Orogastric tube placement.  EXAM: PORTABLE ABDOMEN - 1 VIEW  COMPARISON:  01/14/2013  FINDINGS: Orogastric tube tip passes to the GE junction into the proximal stomach. Consider inserting an additional 10 cm to allow the tip to extend into the mid to distal stomach.  IMPRESSION: Orogastric tube tip lies in the proximal stomach.   Electronically Signed   By: Amie Portland M.D.   On: 01/15/2013 21:50  ASSESSMENT / PLAN:  PULMONARY A: Acute hypoxic/hypercapnic respiratory failure 2nd to AECOPD. Improving slowly 12/28 Metapneumovirus pos  Continued tobacco abuse.  P:   - cont BDs -SBT ?extubation again 12/28 - cont solumedrol regimen   CARDIOVASCULAR A:  Sinus tachycardia >> likely related to acute respiratory distress. Chest pain on admission >> cardiac enzyme negative, ECG and echo unremarkable. P:  - Monitor hemodynamics - Maintain hydration    RENAL A:   Mild hyponatremia. Resolved P:   - Monitor electrolytes   -GASTROINTESTINAL A:   On TF since 01/12/13 . P:   -cont TF - Protonix for SUP - colace  HEMATOLOGIC A:   Mild thrombocytopenia >> chronic, secondary to chemotherapy for hx of colon cancer. P:  - F/u CBC - Continue lovenox for DVT prevention  INFECTIOUS A:   AECOPD. Metapneumovirus POS .    P:   - Continue levaquin - Continue tamiflu 12/22 x 5 days - Continue droplet isolation until clinically improved, and no longer febrile   ENDOCRINE CBG (last 3)   Recent Labs  01/16/13 2010 01/17/13 0501 01/17/13 0740  GLUCAP 205* 115* 209*     A:   Steroid induced hyperglycemia. P:   - SSI while on solumedrol  NEUROLOGIC A:   Hx of Bipolar disorder, binge alcohol consumption >> on chronic benzo's as outpt. P:   - Sedation protocol with propofol, fentanyl and prn versed >> goal RASS 0 - Continue abilify, lexapro - Continue thiamine, folic acid -12-24 low dose klonopin for  agitation  CC33min Global:  Failed extubation 12/26.Repeat SBT and poss extubation 12/28 May need trach if fails  01/17/2013, 8:43 AM  Storm Frisk, MD [facility]  [phone]  Cell  [phone]  If no response or cell goes to voicemail, call beeper [phone]

## 2013-01-17 NOTE — Procedures (Signed)
Extubation Procedure Note  Patient Details:   Name: Taylor [name] DOB: May 04, 1955 MRN: [id number]   Airway Documentation:  Airway 8 mm (Active)  Secured at (cm) 26 cm 01/17/2013  7:37 AM  Measured From Lips 01/17/2013  7:37 AM  Secured Location Center 01/17/2013  7:37 AM  Secured By [employer] 01/17/2013  7:37 AM  Tube Holder Repositioned Yes 01/17/2013  7:37 AM  Cuff Pressure (cm H2O) 24 cm H2O 01/17/2013  7:37 AM  Site Condition Dry 01/15/2013  6:00 PM    Evaluation  O2 sats: 95% Complications: none Patient tolerated procedure well. Bilateral Breath Sounds: Clear;Diminished Suctioning: Airway Pt able to speak  Per CCM, pt extubated to nasal cannula.  Pt was stable throughout, no complications.  Revonda Humphrey 01/17/2013, 10:42 AM

## 2013-01-18 ENCOUNTER — Inpatient Hospital Stay (HOSPITAL_COMMUNITY): Payer: Medicaid Other

## 2013-01-18 LAB — CBC WITH DIFFERENTIAL/PLATELET
Basophils Absolute: 0 10*3/uL (ref 0.0–0.1)
Eosinophils Absolute: 0 10*3/uL (ref 0.0–0.7)
Eosinophils Relative: 0 % (ref 0–5)
Lymphocytes Relative: 10 % — ABNORMAL LOW (ref 12–46)
Lymphs Abs: 1 10*3/uL (ref 0.7–4.0)
MCH: 33.3 pg (ref 26.0–34.0)
MCV: 99.5 fL (ref 78.0–100.0)
Monocytes Relative: 11 % (ref 3–12)
Neutro Abs: 7.8 10*3/uL — ABNORMAL HIGH (ref 1.7–7.7)
Neutrophils Relative %: 79 % — ABNORMAL HIGH (ref 43–77)
Platelets: 159 10*3/uL (ref 150–400)
RDW: 12.8 % (ref 11.5–15.5)
WBC: 9.9 10*3/uL (ref 4.0–10.5)

## 2013-01-18 LAB — GLUCOSE, CAPILLARY
Glucose-Capillary: 108 mg/dL — ABNORMAL HIGH (ref 70–99)
Glucose-Capillary: 146 mg/dL — ABNORMAL HIGH (ref 70–99)
Glucose-Capillary: 133 mg/dL — ABNORMAL HIGH (ref 70–99)
Glucose-Capillary: 161 mg/dL — ABNORMAL HIGH (ref 70–99)

## 2013-01-18 LAB — BASIC METABOLIC PANEL
CO2: 36 mEq/L — ABNORMAL HIGH (ref 19–32)
Creatinine, Ser: 0.63 mg/dL (ref 0.50–1.35)
GFR calc non Af Amer: >90 mL/min (ref >90)
Glucose, Bld: 109 mg/dL — ABNORMAL HIGH (ref 70–99)
Potassium: 3.6 mEq/L (ref 3.5–5.1)
Sodium: 142 mEq/L (ref 135–145)

## 2013-01-18 MED ORDER — BUDESONIDE 0.5 MG/2ML IN SUSP
0.5000 mg | Freq: Two times a day (BID) | RESPIRATORY_TRACT | Status: DC
  Administered 2013-01-18 – 2013-01-22 (×9): 0.5 mg via RESPIRATORY_TRACT
  Filled 2013-01-18 (×17): qty 2

## 2013-01-18 MED ORDER — ARFORMOTEROL TARTRATE 15 MCG/2ML IN NEBU
15.0000 ug | INHALATION_SOLUTION | Freq: Two times a day (BID) | RESPIRATORY_TRACT | Status: DC
  Administered 2013-01-18 – 2013-01-22 (×9): 15 ug via RESPIRATORY_TRACT
  Filled 2013-01-18 (×12): qty 2

## 2013-01-18 MED ORDER — TIOTROPIUM BROMIDE MONOHYDRATE 18 MCG IN CAPS
18.0000 ug | ORAL_CAPSULE | Freq: Every day | RESPIRATORY_TRACT | Status: DC
  Administered 2013-01-18 – 2013-01-22 (×5): 18 ug via RESPIRATORY_TRACT
  Filled 2013-01-18 (×2): qty 5

## 2013-01-18 MED ORDER — METHYLPREDNISOLONE SODIUM SUCC 40 MG IJ SOLR
20.0000 mg | Freq: Two times a day (BID) | INTRAMUSCULAR | Status: DC
  Administered 2013-01-18 – 2013-01-19 (×3): 20 mg via INTRAVENOUS
  Filled 2013-01-18 (×2): qty 0.5

## 2013-01-18 NOTE — Progress Notes (Addendum)
Pt has rested well during the night.  Remains on 2 L [place], see assessment noted.  However, RT called d/t pt c/o some difficultly breathing this am.  Sats 94%.  Very weak voice. Will continue to monitor and report.

## 2013-01-18 NOTE — Progress Notes (Addendum)
  CARE MANAGEMENT NOTE 01/18/2013  Patient:  Taylor [name], Taylor [name]   Account Number:  [id number]  Date Initiated:  01/11/2013  Documentation initiated by:  DAVIS,RHONDA  Subjective/Objective Assessment:   [t with hx of copd and current smoker-increased wob tried on nebs and bipap progressive dyspnea and was intubated.     Action/Plan:   home when stable   Anticipated DC Date:  01/21/2013   Anticipated DC Plan:  HOME/SELF CARE         Choice offered to / List presented to:             Status of service:  In process, will continue to follow Medicare Important Message given?   (If response is "NO", the following Medicare IM given date fields will be blank) Date Medicare IM given:   Date Additional Medicare IM given:    Discharge Disposition:    Per UR Regulation:  Reviewed for med. necessity/level of care/duration of stay  If discussed at Long Length of Stay Meetings, dates discussed:    Comments:  [id number]/Rhonda Stark Jock, BSN, [place] [phone] Chart Reviewed for discharge and hospital needs. patient was reintubated on pm of [id number] and extubinated on [id number]. Discharge needs at time of review:  None present will follow for needs. Review of patient progress due on [id number].   [id number] Earlene Plater RN, BSN, [place] [phone] Chart Reviewed for discharge and hospital needs.  Patient extubated am of [id number]/requiring 100% and is a high risk for reintubation and poss trach placement. Patient has Medicaid with no LACT benefits, will require snf placement or poss hhc. Review of patient progress due on [id number].    [id number] Earlene Plater, RN, BSN, [place] [phone] Chart Reviewed for discharge and hospital needs.  COPD GOLD PROTOCOL Discharge needs at time of review:  None present will follow for needs. Review of patient progress due on [id number].

## 2013-01-18 NOTE — Evaluation (Signed)
Clinical/Bedside Swallow Evaluation Patient Details  Name: Taylor [name] MRN: [id number] Date of Birth: 29-Sep-1955  Today's Date: 01/18/2013 Time: 1245-1300 SLP Time Calculation (min): 15 min  Past Medical History:  Past Medical History  Diagnosis Date  . Inguinal hernia     RIGHT- PAINFUL  . Depression     Bipolar disorder  . Hemorrhoids   . Full dentures   . Rib fractures     hx of  . Bipolar 1 disorder 02/05/2012  . Shortness of breath   . COPD (chronic obstructive pulmonary disease)     SMOKER  . 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 Colon cancer 04/11/2011   Past Surgical History:  Past Surgical History  Procedure Laterality Date  . Colostomy revision  04/11/2011    Procedure: COLON RESECTION SIGMOID;  Surgeon: Velora Heckler, MD;  Location: [facility];  Service: General;  Laterality: N/A;  low anterior colon resection   . Colon surgery  04/11/11    Sigmoid colectomy  . Portacath placement    . Portacath placement  05/02/2011    Procedure: INSERTION PORT-A-CATH;  Surgeon: Velora Heckler, MD;  Location: [facility];  Service: General;  Laterality: N/A;  . Inguinal hernia repair Right 05/01/2012    Procedure: HERNIA REPAIR INGUINAL ADULT;  Surgeon: Velora Heckler, MD;  Location: [facility];  Service: General;  Laterality: Right;  . Insertion of mesh Right 05/01/2012    Procedure: INSERTION OF MESH;  Surgeon: Velora Heckler, MD;  Location: [facility];  Service: General;  Laterality: Right;  . Port-a-cath removal Left 05/01/2012    Procedure: REMOVAL Infusion Port;  Surgeon: Velora Heckler, MD;  Location: [facility];  Service: General;  Laterality: Left;   HPI:  57 year old male with COPD, depression, Inguinal hernia, bipolar disorder, ETOH,  admitted 12/20 with respiratory failure requiring intubation. Failed weaning 12/24, attempted extubation 12/26 however failed and reintubated. Extubated 12/28 am.    Assessment / Plan /  Recommendation Clinical Impression  Bedside swallow evaluation complete. Patient presents with a functional appearing oropharyngeal swallow with appropriate oral transit, initiation of the swallow and no overt indication of decreased airway protection. Risk of aspiration remains moderate given  dysphonia s/p multiple intubations with suspected irritation and sensation loss. Recommend mechanical soft solids to increase comfort and aid in energy conservation (SOB) with swallow and thin liquids with aspiration precautions. Prognosis for improvement good over the next few days with time off vent. Education complete with patient regarding above. Patient able to verbalize understanding.     Aspiration Risk  Moderate    Diet Recommendation Dysphagia 3 (Mechanical Soft);Thin liquid   Liquid Administration via: Cup;No straw Medication Administration: Whole meds with puree Supervision: Patient able to self feed;Intermittent supervision to cue for compensatory strategies Compensations: Slow rate;Small sips/bites Postural Changes and/or Swallow Maneuvers: Seated upright 90 degrees    Other  Recommendations Oral Care Recommendations: Oral care BID   Follow Up Recommendations  None    Frequency and Duration min 2x/week  1 week   Pertinent Vitals/Pain N/a        Swallow Study    General HPI: 57 year old male with COPD, depression, Inguinal hernia, bipolar disorder, ETOH,  admitted 12/20 with respiratory failure requiring intubation. Failed weaning 12/24, attempted extubation 12/26 however failed and reintubated. Extubated 12/28 am.  Type of Study: Bedside swallow evaluation Previous Swallow Assessment: none Diet Prior to this Study: NPO Temperature Spikes  Noted: No Respiratory Status: Nasal cannula History of Recent Intubation: Yes Length of Intubations (days): 7 days Date extubated: 01/17/13 (multiple intubations) Behavior/Cognition: Alert;Pleasant mood;Cooperative Oral Cavity - Dentition:  Dentures, top;Dentures, bottom Self-Feeding Abilities: Able to feed self Patient Positioning: Upright in chair Baseline Vocal Quality: Breathy;Low vocal intensity;Hoarse Volitional Cough: Weak Volitional Swallow: Able to elicit    Oral/Motor/Sensory Function Overall Oral Motor/Sensory Function: Appears within functional limits for tasks assessed   Ice Chips Ice chips: Within functional limits Presentation: Spoon   Thin Liquid Thin Liquid: Within functional limits Presentation: Cup;Self Fed;Straw    Nectar Thick Nectar Thick Liquid: Not tested   Honey Thick Honey Thick Liquid: Not tested   Puree Puree: Within functional limits Presentation: Self Fed;Spoon   Solid   GO   Thora Scherman MA, CCC-SLP [phone]  Solid: Within functional limits Presentation: Self Fed       Donielle Radziewicz Meryl 01/18/2013,1:01 PM

## 2013-01-18 NOTE — Progress Notes (Signed)
Name: Taylor [name] MRN: [id number] DOB: December 21, 1955    ADMISSION DATE:  01/09/2013 CONSULTATION DATE:  01/10/2013  REFERRING MD :  Everett Graff  CHIEF COMPLAINT:  Can't breath  BRIEF PATIENT DESCRIPTION:  57 yo male smoker admit with AECOPD.  Developed VDRF 12/21 and PCCM assumed care.  Followed by Dr. Sherene Sires in pulmonary office for GOLD IV COPD with emphysema.   SIGNIFICANT EVENTS: 12-20 Admit to SDU 12-21 VDRF, PCCM assumed care 12-23 hypercarbic acidosis 12-24 failed weaning 12-26 attempt extubation failed and reintubated   STUDIES:  PFT's 12/15/2012 >> FEV1 0.89 (24%) with Ratio 38 p am saba and DLCO 44% with 68  Echo 12/21 >> EF 50 to 55%, mild LVH  LINES / TUBES: ETT 12/22 >> 12-26>>12/26 reintubated>>12/28  CULTURES: Influenza PCR 12/20 >> negative Sputum 12/22 > NORMAL FLORA 12-22 bc>>NEG 12-20 uc>>55K lactobacillus 01/14/13 - RESP VIRUS PCR  (tracheal aspirate) >>meta pneumovirus  ANTIBIOTICS: Levaquin 12/20 >> 12/29 Tamiflu 12/22 >>  12/26  SUBJECTIVE:  C/o hoarseness and raspy voice.  Feels hungry.  Denies chest/abd pain.  Still has cough, but not much sputum.  VITAL SIGNS: Temp:  [97.8 F (36.6 C)-98.9 F (37.2 C)] 98.2 F (36.8 C) (12/29 0400) Pulse Rate:  [71-88] 79 (12/29 0700) Resp:  [12-32] 26 (12/29 0700) BP: (94-150)/(59-90) 124/69 mmHg (12/29 0700) SpO2:  [92 %-99 %] 93 % (12/29 0700) FiO2 (%):  [40 %] 40 % (12/28 1000) Weight:  [156 lb 15.5 oz (71.2 kg)] 156 lb 15.5 oz (71.2 kg) (12/29 0400) 3 liters [name]  INTAKE / OUTPUT: Intake/Output     12/28 0701 - 12/29 0700 12/29 0701 - 12/30 0700   I.V. (mL/kg) 458.1 (6.4)    Other 120    NG/GT 110    IV Piggyback 150    Total Intake(mL/kg) 838.1 (11.8)    Urine (mL/kg/hr) 2525 (1.5)    Total Output 2525     Net -1686.9          Stool Occurrence 1 x      PHYSICAL EXAMINATION: General: no distress Neuro: alert, normal strength HEENT: mild b/l maxillary sinus  tenderness Cardiovascular: regular Lungs: decreased breath sounds, faint b/l expiratory wheeze Abdomen:  Soft, non tender, + bowel sounds Extremities: no edema Skin:  No rash.   LABS: CBC Recent Labs     01/16/13  0510  01/17/13  0348  01/18/13  0320  WBC  6.4  7.5  9.9  HGB  13.4  14.2  13.9  HCT  42.5  43.0  41.6  PLT  155  151  159   BMET Recent Labs     01/16/13  0510  01/17/13  0348  01/18/13  0320  NA  143  141  142  K  4.3  3.8  3.6  CL  102  98  100  CO2  36*  36*  36*  BUN  36*  40*  42*  CREATININE  0.64  0.71  0.63  GLUCOSE  160*  176*  109*    Electrolytes Recent Labs     01/16/13  0510  01/17/13  0348  01/18/13  0320  CALCIUM  8.8  8.7  8.8  MG  2.5   --    --   PHOS  2.4   --    --    ABG Recent Labs     01/15/13  1729  PHART  7.427  PCO2ART  55.6*  PO2ART  70.2*  Glucose Recent Labs     01/17/13  0740  01/17/13  1200  01/17/13  1617  01/17/13  2014  01/17/13  2324  01/18/13  0355  GLUCAP  209*  204*  134*  116*  175*  108*    Imaging Dg Chest Port 1 View  01/18/2013   CLINICAL DATA:  COPD.  EXAM: PORTABLE CHEST - 1 VIEW  COMPARISON:  01/17/2013.  FINDINGS: Mediastinum and hilar structures are normal. Lungs are clear infiltrates. COPD cannot be excluded. Heart size and pulmonary vascularity normal. Very tiny left pleural effusion cannot be excluded P No pneumothorax. On right rib fractures.  IMPRESSION: COPD. Tiny left pleural effusion cannot be excluded. No acute abnormality otherwise noted.   Electronically Signed   By: Maisie Fus  Register   On: 01/18/2013 06:55   Dg Chest Port 1 View  01/17/2013   CLINICAL DATA:  Check endotracheal tube  EXAM: PORTABLE CHEST - 1 VIEW  COMPARISON:  Tubular device is stable. Lungs clear. Normal heart size.  FINDINGS: The heart size and mediastinal contours are within normal limits. Both lungs are clear. The visualized skeletal structures are unremarkable.  IMPRESSION: Stable.  Clear lungs.    Electronically Signed   By: Maryclare Bean M.D.   On: 01/17/2013 08:40    ASSESSMENT / PLAN:  PULMONARY A: Acute hypoxic/hypercapnic respiratory failure 2nd to AECOPD from Metapneumovirus. Continued tobacco abuse. Post-extubation hoarseness. P:   -change to brovana, pulmicort, and spiriva -prn albuterol -change solumedrol to 20 mg bid >> transition to prednisone when okay to swallow pills -f/u CXR as needed -oxygen to keep SpO2 > 90%  CARDIOVASCULAR A:  Sinus tachycardia >> likely related to acute respiratory distress. Chest pain on admission >> cardiac enzyme negative, ECG and echo unremarkable. P:  -Monitor hemodynamics  RENAL A:   Mild hyponatremia. Resolved P:   -Monitor electrolytes  GASTROINTESTINAL A:   Nutrition. Dysphagia. P:   -NPO until swallowing assessed by speech therapy -continue protonix for SUP >> likely can d/c soon  HEMATOLOGIC A:   Mild thrombocytopenia >> chronic, secondary to chemotherapy for hx of colon cancer. P:  -F/u CBC intermittently -Continue lovenox for DVT prevention  INFECTIOUS A:   AECOPD. Metapneumovirus POS .  P:   -d/c levaquin 12/29  ENDOCRINE A:   Steroid induced hyperglycemia. P:   - SSI while on solumedrol  NEUROLOGIC A:   Hx of Bipolar disorder, binge alcohol consumption >> on chronic benzo's as outpt. Deconditioning. P:   -Continue abilify, lexapro, thiamine, folic acid -PT/OT consult -hold outpt klonopin for now  Summary: Respiratory status improving.  Will need to have speech assess swallowing, then advance diet.  Will also need to work with speech regarding hoarseness after multiple intubations.  Start to mobilize, and get PT/OT.  Change to SDU status.  Coralyn Helling, MD [facility] Pulmonary/Critical Care 01/18/2013, 7:51 AM Pager:  [phone] After 3pm call: [phone]

## 2013-01-18 NOTE — Progress Notes (Signed)
NUTRITION FOLLOW UP  Intervention:   - Diet advancement per SLP/MD - Will continue to monitor   Nutrition Dx:   Inadequate oral intake related to inability to eat as evidenced by NPO - ongoing   Goal:   Initiation of enteral nutrition with goal to meet >90% of estimated nutritional needs;  Not met, pt no longer on TF since extubation   New goal: Advance diet as tolerated to diet per SLP/MD.   Monitor:   Weights, labs, diet advancement  Assessment:   Admitted with shortness of breath x 3 days. Hx of COPD and colon CA s/p chemoradiation. Pt with full dentures. Pt reported on admission that he drinks on weekends about 6-12 beers. Last drink was reported to be 5 days ago. Pt found to have acute COPD exacerbation.  12/21 Pt developed progressive respiratory acidosis requiring intubation. 12/23 TF initiated at a slow rate.  12/24 Vital AF 1.2 running @ 10 ml/hr through the night. 5 ml residual this AM. TF held at time of visit because it was thought pt may be extubated; RN to resume TF again this AM. Per RN, pt reported that abdominal distention is normal. Will increase TF more quickly.  12/25 Vital AF 1.2 advanced to 42ml/hr with TF residual of and TF not increased to goal rate of 76ml/hr per RN notes.  12/26 Pt extubated in the morning, re-intubated that afternoon. TF re-started. 12/27 Pt reached goal rate of Vital AF 1.2 at 13ml/hr 12/28 Pt extubated  Pt currently NPO, awaiting swallow evaluation. Pt's weight down 3 pounds since admission. Met with pt who c/o being hungry.   BUN slightly elevated Potassium, magnesium, and phosphorus WNL  Height: Ht Readings from Last 1 Encounters:  01/11/13 5\' 11"  (1.803 m)    Weight Status:   01/17/13         180 lb 12.4 oz (82 kg) Admit wt:        183 lb 10.3 oz (83.3 kg)   Re-estimated needs:  Kcal: 2050-2250 Protein: 100-120 grams Fluid: 2-2.2 L/day  Skin: intact  Diet Order: NPO   Intake/Output Summary (Last 24 hours) at  01/18/13 1018 Last data filed at 01/18/13 0700  Gross per 24 hour  Intake  606.5 ml  Output   1875 ml  Net -1268.5 ml    Last BM:12/26   Labs:   Recent Labs Lab 01/13/13 0330  01/15/13 0340 01/16/13 0510 01/17/13 0348 01/18/13 0320  NA 142  < > 147* 143 141 142  K 4.6  < > 4.2 4.3 3.8 3.6  CL 108  < > 110 102 98 100  CO2 28  < > 32 36* 36* 36*  BUN 52*  < > 45* 36* 40* 42*  CREATININE 0.99  < > 0.75 0.64 0.71 0.63  CALCIUM 8.6  < > 8.3* 8.8 8.7 8.8  MG 3.1*  --  2.7* 2.5  --   --   PHOS 1.9*  --  2.0* 2.4  --   --   GLUCOSE 151*  < > 153* 160* 176* 109*  < > = values in this interval not displayed.  CBG (last 3)   Recent Labs  01/17/13 2324 01/18/13 0355 01/18/13 0838  GLUCAP 175* 108* 146*    Scheduled Meds: . antiseptic oral rinse  15 mL Mouth Rinse QID  . arformoterol  15 mcg Nebulization BID  . ARIPiprazole  2 mg Oral Daily  . budesonide (PULMICORT) nebulizer solution  0.5 mg Nebulization BID  .  chlorhexidine  15 mL Mouth Rinse BID  . enoxaparin (LOVENOX) injection  40 mg Subcutaneous QHS  . escitalopram  20 mg Oral Daily  . folic acid  1 mg Oral Daily  . insulin aspart  0-15 Units Subcutaneous Q4H  . methylPREDNISolone (SOLU-MEDROL) injection  20 mg Intravenous Q12H  . pantoprazole  40 mg Oral Q1200  . senna-docusate  2 tablet Oral QHS  . sodium chloride  3 mL Intravenous Q12H  . thiamine  100 mg Oral Daily  . tiotropium  18 mcg Inhalation Daily    Continuous Infusions: . sodium chloride 20 mL/hr (01/14/13 1240)    Levon Hedger MS, RD, LDN [phone] Pager [phone] After Hours Pager

## 2013-01-18 NOTE — Evaluation (Signed)
Physical Therapy Evaluation Patient Details Name: Taylor [name] MRN: [id number] DOB: 1955-10-24 Today's Date: 01/18/2013 Time: 9629-5284 PT Time Calculation (min): 15 min  PT Assessment / Plan / Recommendation History of Present Illness  Taylor [name] is a 57 y.o. male  admitted with respiratory distress, VDRF.   Clinical Impression  Pt very weak and respiratory compromised. Pt will benefit from PT to address problems to return to independence. Recommend OT consult. Pt lives alone. May benefit from SNF/?CIR>    PT Assessment  Patient needs continued PT services    Follow Up Recommendations  SNF (tbd)    Does the patient have the potential to tolerate intense rehabilitation      Barriers to Discharge Decreased caregiver support      Equipment Recommendations  Rolling walker with 5" wheels    Recommendations for Other Services OT consult   Frequency Min 3X/week    Precautions / Restrictions Precautions Precautions: Fall Precaution Comments: monitor sats, O2   Pertinent Vitals/Pain sats 85% 2 l during amb.  RR 31      Mobility  Bed Mobility Bed Mobility: Not assessed Transfers Transfers: Sit to Stand;Stand to Sit Sit to Stand: 4: Min assist;With upper extremity assist;From chair/3-in-1 Stand to Sit: 4: Min assist;With upper extremity assist;To chair/3-in-1 Details for Transfer Assistance: cues for UE use, extra time to rise from chair. Ambulation/Gait Ambulation/Gait Assistance: 1: +2 Total assist Ambulation/Gait: Patient Percentage: 70% Ambulation Distance (Feet): 30 Feet Assistive device: Rolling walker Ambulation/Gait Assistance Details: pt had unsteady gait, legs crossing,  Gait Pattern: Step-through pattern;Shuffle;Scissoring;Decreased hip/knee flexion - right;Decreased stride length;Decreased hip/knee flexion - left    Exercises     PT Diagnosis: Difficulty walking;Generalized weakness  PT Problem List: Decreased strength;Decreased activity  tolerance;Decreased mobility;Decreased balance;Cardiopulmonary status limiting activity;Decreased knowledge of precautions;Decreased safety awareness;Decreased knowledge of use of DME PT Treatment Interventions: DME instruction;Functional mobility training;Therapeutic activities;Therapeutic exercise;Patient/family education     PT Goals(Current goals can be found in the care plan section) Acute Rehab PT Goals Patient Stated Goal: I will try to walk. PT Goal Formulation: With patient Time For Goal Achievement: 02/01/13 Potential to Achieve Goals: Good  Visit Information  Last PT Received On: 01/18/13 Assistance Needed: +2 History of Present Illness: Taylor [name] is a 57 y.o. male  admitted with respiratory distress, VDRF,        Prior Functioning  Home Living Family/patient expects to be discharged to:: Private residence Living Arrangements: Alone Type of Home: House Home Access: Level entry Home Layout: One level Home Equipment: None Prior Function Level of Independence: Independent Communication Communication:  (respiratory distress limits voice loudness.)    Cognition  Cognition Arousal/Alertness: Awake/alert Behavior During Therapy: WFL for tasks assessed/performed Overall Cognitive Status: Within Functional Limits for tasks assessed    Extremity/Trunk Assessment Upper Extremity Assessment Upper Extremity Assessment: Generalized weakness Lower Extremity Assessment Lower Extremity Assessment: Generalized weakness   Balance Balance Balance Assessed: Yes Static Standing Balance Static Standing - Balance Support: Bilateral upper extremity supported Static Standing - Level of Assistance: 4: Min assist Static Standing - Comment/# of Minutes: at [employer]  End of Session PT - End of Session Equipment Utilized During Treatment: Gait belt Activity Tolerance: Patient limited by fatigue Patient left: in chair;with call bell/phone within reach Nurse Communication: Mobility  status  GP     Rada Hay 01/18/2013, 5:01 PM Blanchard Kelch PT [phone]

## 2013-01-19 DIAGNOSIS — J209 Acute bronchitis, unspecified: Secondary | ICD-10-CM

## 2013-01-19 LAB — GLUCOSE, CAPILLARY
Glucose-Capillary: 115 mg/dL — ABNORMAL HIGH (ref 70–99)
Glucose-Capillary: 243 mg/dL — ABNORMAL HIGH (ref 70–99)
Glucose-Capillary: 77 mg/dL (ref 70–99)

## 2013-01-19 MED ORDER — PREDNISONE 20 MG PO TABS
30.0000 mg | ORAL_TABLET | Freq: Every day | ORAL | Status: DC
  Administered 2013-01-20 – 2013-01-22 (×3): 30 mg via ORAL
  Filled 2013-01-19 (×5): qty 1

## 2013-01-19 MED ORDER — INSULIN ASPART 100 UNIT/ML ~~LOC~~ SOLN
0.0000 [IU] | Freq: Three times a day (TID) | SUBCUTANEOUS | Status: DC
  Administered 2013-01-20 (×2): 3 [IU] via SUBCUTANEOUS
  Administered 2013-01-20 – 2013-01-21 (×2): 2 [IU] via SUBCUTANEOUS
  Administered 2013-01-21 (×2): 3 [IU] via SUBCUTANEOUS
  Administered 2013-01-21 – 2013-01-22 (×2): 2 [IU] via SUBCUTANEOUS

## 2013-01-19 NOTE — Progress Notes (Signed)
NUTRITION FOLLOW UP/CONSULT  Intervention:   - Pt currently eating well without any nutritional needs, nutrition signing off   Nutrition Dx:   Inadequate oral intake related to inability to eat as evidenced by NPO - resolved, diet advanced and pt eating well    Goal:   Advance diet as tolerated to diet per SLP/MD -  met   Assessment:   Admitted with shortness of breath x 3 days. Hx of COPD and colon CA s/p chemoradiation. Pt with full dentures. Pt reported on admission that he drinks on weekends about 6-12 beers. Last drink was reported to be 5 days ago. Pt found to have acute COPD exacerbation.  12/21 Pt developed progressive respiratory acidosis requiring intubation. 12/23 TF initiated at a slow rate.  12/24 Vital AF 1.2 running @ 10 ml/hr through the night. 5 ml residual this AM. TF held at time of visit because it was thought pt may be extubated; RN to resume TF again this AM. Per RN, pt reported that abdominal distention is normal. Will increase TF more quickly.  12/25 Vital AF 1.2 advanced to 53ml/hr with TF residual of and TF not increased to goal rate of 60ml/hr per RN notes.  12/26 Pt extubated in the morning, re-intubated that afternoon. TF re-started. 12/27 Pt reached goal rate of Vital AF 1.2 at 43ml/hr 12/28 Pt extubated. Pt currently NPO, awaiting swallow evaluation. Pt's weight down 3 pounds since admission. Met with pt who c/o being hungry.  12/29 SLP bedside swallow evaluation - noted pt with moderate aspiration risk with recommendations for dysphagia 3/thin. Diet advanced to dysphagia 3/thin. 12/30 Met with pt who reports eating well, >50% of meals. Denies needing any nutritional supplements at this time.   BUN slightly elevated Potassium, magnesium, and phosphorus WNL  Height: Ht Readings from Last 1 Encounters:  01/11/13 5\' 11"  (1.803 m)    Weight Status:   01/17/13         180 lb 12.4 oz (82 kg) Admit wt:        183 lb 10.3 oz (83.3 kg)   Re-estimated  needs:  Kcal: 2050-2250 Protein: 100-120 grams Fluid: 2-2.2 L/day  Skin: intact  Diet Order: Dysphagia 3/thin   Intake/Output Summary (Last 24 hours) at 01/19/13 1327 Last data filed at 01/19/13 0900  Gross per 24 hour  Intake    480 ml  Output    950 ml  Net   -470 ml    Last BM:12/26   Labs:   Recent Labs Lab 01/13/13 0330  01/15/13 0340 01/16/13 0510 01/17/13 0348 01/18/13 0320  NA 142  < > 147* 143 141 142  K 4.6  < > 4.2 4.3 3.8 3.6  CL 108  < > 110 102 98 100  CO2 28  < > 32 36* 36* 36*  BUN 52*  < > 45* 36* 40* 42*  CREATININE 0.99  < > 0.75 0.64 0.71 0.63  CALCIUM 8.6  < > 8.3* 8.8 8.7 8.8  MG 3.1*  --  2.7* 2.5  --   --   PHOS 1.9*  --  2.0* 2.4  --   --   GLUCOSE 151*  < > 153* 160* 176* 109*  < > = values in this interval not displayed.  CBG (last 3)   Recent Labs  01/19/13 0322 01/19/13 0801 01/19/13 1201  GLUCAP 115* 170* 195*    Scheduled Meds: . antiseptic oral rinse  15 mL Mouth Rinse QID  . arformoterol  15 mcg Nebulization BID  . ARIPiprazole  2 mg Oral Daily  . budesonide (PULMICORT) nebulizer solution  0.5 mg Nebulization BID  . chlorhexidine  15 mL Mouth Rinse BID  . enoxaparin (LOVENOX) injection  40 mg Subcutaneous QHS  . escitalopram  20 mg Oral Daily  . folic acid  1 mg Oral Daily  . insulin aspart  0-15 Units Subcutaneous Q4H  . methylPREDNISolone (SOLU-MEDROL) injection  20 mg Intravenous Q12H  . pantoprazole  40 mg Oral Q1200  . senna-docusate  2 tablet Oral QHS  . sodium chloride  3 mL Intravenous Q12H  . thiamine  100 mg Oral Daily  . tiotropium  18 mcg Inhalation Daily    Continuous Infusions: . sodium chloride 20 mL/hr (01/14/13 1240)    Levon Hedger MS, RD, LDN [phone] Pager [phone] After Hours Pager

## 2013-01-19 NOTE — Evaluation (Signed)
Occupational Therapy Evaluation Patient Details Name: Taylor [name] MRN: [id number] DOB: 1955/02/20 Today's Date: 01/19/2013 Time: 8295-6213 OT Time Calculation (min): 17 min  OT Assessment / Plan / Recommendation History of present illness Taylor [name] is a 57 y.o. male  admitted with respiratory distress, VDRF,    Clinical Impression   Pt was admitted for the above.  At baseline, he lives alone and is independent with adls.  He currently needs mod A overall for LB due to decreased activity tolerance.  He will benefit from skilled OT and goals in acute are for supervision level overall.      OT Assessment  Patient needs continued OT Services    Follow Up Recommendations  SNF    Barriers to Discharge      Equipment Recommendations  Other (comment) (to be further assessed:  possibly 3:1 commode)    Recommendations for Other Services    Frequency  Min 2X/week    Precautions / Restrictions Precautions Precautions: Fall Precaution Comments: monitor sats, O2 Restrictions Weight Bearing Restrictions: No   Pertinent Vitals/Pain No pain reported.  HR 80s and sats remained in 90s    ADL  Grooming: Set up Where Assessed - Grooming: Unsupported sitting Upper Body Bathing: Set up Where Assessed - Upper Body Bathing: Unsupported sitting Lower Body Bathing: Moderate assistance Where Assessed - Lower Body Bathing: Supported sit to stand Upper Body Dressing: Set up Where Assessed - Upper Body Dressing: Unsupported sitting Lower Body Dressing: Moderate assistance Where Assessed - Lower Body Dressing: Supported sit to stand Equipment Used: Agricultural consultant Transfers/Ambulation Related to ADLs: pt sidestepped up HOB with RW and min A; sat quickly ADL Comments: pt fatiques easily.  Dyspnea 2-3/4 with activity.  Cued for pursed lip breathing.  Pt states he sat up in chair earlier.  Sat EOB then returned to supine    OT Diagnosis: Generalized weakness  OT Problem List:  Decreased strength;Decreased activity tolerance;Impaired balance (sitting and/or standing);Decreased knowledge of use of DME or AE;Cardiopulmonary status limiting activity OT Treatment Interventions: Self-care/ADL training;Energy conservation;DME and/or AE instruction;Patient/family education;Balance training;Therapeutic activities   OT Goals(Current goals can be found in the care plan section) Acute Rehab OT Goals Patient Stated Goal: get moving better OT Goal Formulation: With patient Time For Goal Achievement: 02/02/13 Potential to Achieve Goals: Good ADL Goals Pt Will Transfer to Toilet: with min guard assist;ambulating;bedside commode Pt Will Perform Toileting - Clothing Manipulation and hygiene: with supervision;sit to/from stand Additional ADL Goal #1: pt will perform UB adls with set up and initiate at least one rest break for energy conservation Additional ADL Goal #2: Pt will perform LB adls with min A and initiate at least one rest break for energy conservation Additional ADL Goal #3: Pt will be independent with LUE strengthening program--AROM shoulder to level one theraband  Visit Information  Last OT Received On: 01/19/13 Assistance Needed: +2 History of Present Illness: Taylor [name] is a 57 y.o. male  admitted with respiratory distress, VDRF,        Prior Functioning     Home Living Family/patient expects to be discharged to:: Unsure Living Arrangements: Alone Additional Comments: pt was home alone.  He has a standard commode and tub transfer Prior Function Level of Independence: Independent Communication Communication: No difficulties         Vision/Perception     Cognition  Cognition Arousal/Alertness: Awake/alert Behavior During Therapy: WFL for tasks assessed/performed Overall Cognitive Status: Within Functional Limits for tasks assessed  Extremity/Trunk Assessment Upper Extremity Assessment Upper Extremity Assessment: Generalized weakness  (LUE much weaker:  AROM to 90; strength 3-/5 shoulder)     Mobility Bed Mobility Bed Mobility: Supine to Sit;Sit to Supine Supine to Sit: 4: Min assist;HOB flat Sit to Supine: 4: Min assist;HOB flat Details for Bed Mobility Assistance: assist for trunk when sitting up--pt rolled to R.  Assist for legs when back to bed Transfers Sit to Stand: 4: Min assist;From bed;From elevated surface Stand to Sit: 4: Min assist;To bed Details for Transfer Assistance: cues for hand placement     Exercise     Balance Static Standing Balance Static Standing - Balance Support: Bilateral upper extremity supported Static Standing - Level of Assistance: 5: Stand by assistance Static Standing - Comment/# of Minutes: RW, 1 minute   End of Session OT - End of Session Activity Tolerance: Patient tolerated treatment well Patient left: in bed;with call bell/phone within reach;with bed alarm set  GO     Taylor [name] 01/19/2013, 2:43 PM Marica Otter, OTR/L [phone] 01/19/2013

## 2013-01-19 NOTE — Clinical Social Work Psychosocial (Signed)
     Clinical Social Work Department BRIEF PSYCHOSOCIAL ASSESSMENT 01/19/2013  Patient:  Taylor [name], Taylor [name]     Account Number:  [id number]     Admit date:  01/09/2013  Clinical Social Worker:  Hattie Perch  Date/Time:  01/19/2013 12:00 M  Referred by:  Physician  Date Referred:  01/19/2013 Referred for  SNF Placement   Other Referral:   Interview type:  Patient Other interview type:    PSYCHOSOCIAL DATA Living Status:  ALONE Admitted from facility:   Level of care:   Primary support name:  Debra Purdue Primary support relationship to patient:  SIBLING Degree of support available:   fair    CURRENT CONCERNS Current Concerns  Post-Acute Placement   Other Concerns:    SOCIAL WORK ASSESSMENT / PLAN Patient transferred today to [facility]. There is a consult for snf placement. There was a previous consult for an advanced directive but ICU social worker was unable to ascertain due to patient being intubated and not in a state where he could agree. CSW met with patient regarding snf placement. Patient is alert and oriented X3. CSW had difficulty hearing patient due to patient speaking very softly which RN states is due to patient previously being intubated. Discussed snf placement with patient. He states that he thinks that he will be able to go home and take care of himself upon discharge. CSW asked where he would go. He states that his mom and dad have both passed away and that his sister is coming up to visit him tonight. He states that his sister is the executor of his parents estate and he is going to ask her if he can live in his mom and dad's house as no one else is currently living there. CSW asked if he thinks that she will let him do that. He states that he thinks she will for a little while if he gives her a time frame. He is, however, agreeable to home health. Patient is sitting up in the chair watching television. RN states that she believes that he would be able to  care for himself at home as well but she wants to make sure that he has a place to go. Patient states that he is fairly confident that his sister will let him stay there. He does not believe that he has any other needs from CSW.   Assessment/plan status:   Other assessment/ plan:   Information/referral to community resources:    PATIENTS/FAMILYS RESPONSE TO PLAN OF CARE: Patient states that he is feeling much better and is hopeful that he will be able to continue his recovery at home rather than at a rehab center. CSW discussed risks and benefits, as well as insurance limitations. Patient denies any other need for CSW.

## 2013-01-19 NOTE — Progress Notes (Signed)
Name: Taylor [name] MRN: [id number] DOB: [birth year]    ADMISSION DATE:  01/09/2013 CONSULTATION DATE:  01/10/2013  REFERRING MD :  Everett Graff  CHIEF COMPLAINT:  Can't breath  BRIEF PATIENT DESCRIPTION:  57 yo male smoker admit with AECOPD.  Developed VDRF 12/21 and PCCM assumed care.  Followed by Dr. Sherene Sires in pulmonary office for GOLD IV COPD with emphysema.   SIGNIFICANT EVENTS: 12-20 Admit to SDU 12-21 VDRF, PCCM assumed care 12-23 hypercarbic acidosis 12-24 failed weaning 12-26 attempt extubation failed and reintubated   STUDIES:  PFT's 12/15/2012 >> FEV1 0.89 (24%) with Ratio 38 p am saba and DLCO 44% with 68  Echo 12/21 >> EF 50 to 55%, mild LVH  LINES / TUBES: ETT 12/22 >> 12-26>>12/26 reintubated>>12/28  CULTURES: Influenza PCR 12/20 >> negative Sputum 12/22 > NORMAL FLORA 12-22 bc>>NEG 12-20 uc>>55K lactobacillus 01/14/13 - RESP VIRUS PCR  (tracheal aspirate) >>meta pneumovirus  ANTIBIOTICS: Levaquin 12/20 >> 12/29 Tamiflu 12/22 >>  12/26  SUBJECTIVE:  Improving daily  VITAL SIGNS: Temp:  [97.4 F (36.3 C)-98.3 F (36.8 C)] 97.6 F (36.4 C) (12/30 0400) Pulse Rate:  [72-106] 72 (12/30 0500) Resp:  [18-32] 32 (12/30 0500) BP: (112-147)/(59-90) 145/86 mmHg (12/30 0500) SpO2:  [92 %-99 %] 97 % (12/30 0500) 2 liters [place]  INTAKE / OUTPUT: Intake/Output     12/29 0701 - 12/30 0700 12/30 0701 - 12/31 0700   P.O. 400    I.V. (mL/kg) 180 (2.5)    Other     NG/GT     IV Piggyback     Total Intake(mL/kg) 580 (8.1)    Urine (mL/kg/hr) 900 (0.5)    Total Output 900     Net -320          Stool Occurrence 1 x      PHYSICAL EXAMINATION: General: no distress Neuro: alert, normal strength HEENT: mild b/l maxillary sinus tenderness Cardiovascular: regular Lungs: decreased breath sounds, faint b/l expiratory wheeze Abdomen:  Soft, non tender, + bowel sounds Extremities: no edema Skin:  No rash.   LABS: CBC Recent Labs     01/17/13  0348  01/18/13  0320  WBC  7.5  9.9  HGB  14.2  13.9  HCT  43.0  41.6  PLT  151  159   BMET Recent Labs     01/17/13  0348  01/18/13  0320  NA  141  142  K  3.8  3.6  CL  98  100  CO2  36*  36*  BUN  40*  42*  CREATININE  0.71  0.63  GLUCOSE  176*  109*    Electrolytes Recent Labs     01/17/13  0348  01/18/13  0320  CALCIUM  8.7  8.8   ABG No results found for this basename: PHART, PCO2ART, PO2ART,  in the last 72 hours Glucose Recent Labs     01/18/13  0355  01/18/13  0838  01/18/13  1233  01/18/13  1602  01/18/13  1923  01/18/13  2348  GLUCAP  108*  146*  114*  133*  161*  77    Imaging Dg Chest Port 1 View  01/18/2013   CLINICAL DATA:  COPD.  EXAM: PORTABLE CHEST - 1 VIEW  COMPARISON:  01/17/2013.  FINDINGS: Mediastinum and hilar structures are normal. Lungs are clear infiltrates. COPD cannot be excluded. Heart size and pulmonary vascularity normal. Very tiny left pleural effusion cannot be excluded P No pneumothorax.  On right rib fractures.  IMPRESSION: COPD. Tiny left pleural effusion cannot be excluded. No acute abnormality otherwise noted.   Electronically Signed   By: Maisie Fus  Register   On: 01/18/2013 06:55    ASSESSMENT / PLAN:  PULMONARY A: Acute hypoxic/hypercapnic respiratory failure 2nd to AECOPD from Metapneumovirus. Continued tobacco abuse. Post-extubation hoarseness. P:   -cont  brovana, pulmicort, and spiriva -prn albuterol - transition to prednisone 30mg /d -f/u CXR as needed -oxygen to keep SpO2 > 90%  CARDIOVASCULAR A:  Sinus tachycardia >> likely related to acute respiratory distress. Chest pain on admission >> cardiac enzyme negative, ECG and echo unremarkable. P:  -Monitor hemodynamics  RENAL A:   Mild hyponatremia. Resolved P:   -Monitor electrolytes  GASTROINTESTINAL A:   Nutrition. Dysphagia. 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 P:  -F/u CBC intermittently -Continue lovenox for DVT prevention  INFECTIOUS A:   AECOPD. Metapneumovirus POS .  P:   -off all ABX  ENDOCRINE A:   Steroid induced hyperglycemia. P:   - SSI while on steroid  NEUROLOGIC A:   Hx of Bipolar disorder, binge alcohol consumption >> on chronic benzo's as outpt. Deconditioning. P:   -Continue abilify, lexapro, thiamine, folic acid -PT/OT consult -hold outpt klonopin for now  Summary: Respiratory status improving.  .  Start to mobilize, and get PT/OT.  tfr to floor. Keep on PCCM primary svc d/t COPD dx and MW office pt   Dorcas Carrow  [facility] Pulmonary/Critical Care 01/19/2013, 7:45 AM Beeper  [phone]  Cell  [phone]  If no response or cell goes to voicemail, call beeper [phone]

## 2013-01-19 NOTE — Progress Notes (Signed)
Speech Language Pathology Treatment: Dysphagia  Patient Details Name: Taylor [name] MRN: [id number] DOB: 1955-06-14 Today's Date: 01/19/2013 Time: 4098-1191 SLP Time Calculation (min): 10 min  Assessment / Plan / Recommendation Clinical Impression  Pt with subjective improved swallow ability with pt assigning his swallow an 8/10.  Nearly empty lunch tray at bedside.  Pt remains dysphonic but with marginal improvement.   Observed pt to consume soda via cup, no s/s of aspiration and swallow appeared timely.  Pt respiratory rate was 27 however which may impair his swallow/respiratory coordination.   Recommend to continue soft/thin diet with aspiration precautions.  Provided pt with written hand out and verbal instructions/tips to mitigate aspiration with respiratory issue.   SLP to follow up once more to assess for phonatory ability and tolerance of po diet.  Anticipate pt swallow/phonation ability to continue to improve and ability to advance to regular diet soon.     HPI HPI: 57 year old male with COPD, depression, Inguinal hernia, bipolar disorder, ETOH,  admitted 12/20 with respiratory failure requiring intubation. Failed weaning 12/24, attempted extubation 12/26 however failed and reintubated. Extubated 12/28 am.  Pt underwent BSE on 01/18/13 due to aphonia and was started on dys3/thin diet.  Today pt seen for skilled SLP treatment to aid in managing dysphagia.    Pertinent Vitals Afebrile, decreased, CXR 12/29 COPD, nothing acute  SLP Plan  Continue with current plan of care    Recommendations Diet recommendations: Dysphagia 3 (mechanical soft);Thin liquid Liquids provided via: Cup Medication Administration: Whole meds with puree Supervision: Staff to assist with self feeding;Intermittent supervision to cue for compensatory strategies Compensations: Slow rate;Small sips/bites Postural Changes and/or Swallow Maneuvers: Seated upright 90 degrees              Oral Care  Recommendations: Oral care BID Follow up Recommendations: None Plan: Continue with current plan of care    GO     Donavan Burnet, MS [facility] SLP [phone]

## 2013-01-20 LAB — GLUCOSE, CAPILLARY
Glucose-Capillary: 101 mg/dL — ABNORMAL HIGH (ref 70–99)
Glucose-Capillary: 127 mg/dL — ABNORMAL HIGH (ref 70–99)
Glucose-Capillary: 163 mg/dL — ABNORMAL HIGH (ref 70–99)

## 2013-01-20 NOTE — Progress Notes (Signed)
Occupational Therapy Treatment Patient Details Name: Taylor [name] MRN: [id number] DOB: 1955/10/18 Today's Date: 01/20/2013 Time: 4098-1191 OT Time Calculation (min): 29 min  OT Assessment / Plan / Recommendation  History of present illness Taylor [name] is a 57 y.o. male  admitted with respiratory distress, VDRF,    OT comments  Making progress with OT.  Pt hopes to go home with sister checking in.  Would still recommend STSNF at this time for assistance until pt is stronger and has increased activity tolerance.  Cues for safety and pacing  Follow Up Recommendations  SNF.  Pt hopes to return home with [facility] therapies and intermittent assistance from sister who lives about 30 minutes away   Barriers to Discharge       Equipment Recommendations   (has 3:1 from parents)    Recommendations for Other Services    Frequency Min 2X/week   Progress towards OT Goals Progress towards OT goals: Progressing toward goals  Plan      Precautions / Restrictions Precautions Precautions: Fall Precaution Comments: monitor sats, O2 Restrictions Weight Bearing Restrictions: No   Pertinent Vitals/Pain sats 94 - 98% on 02    ADL  Grooming: Supervision/safety;Teeth care Where Assessed - Grooming: Supported standing Toilet Transfer: Hydrographic surveyor Method: Sit to Barista: Comfort height toilet;Grab bars Toileting - Architect and Hygiene: Set up Where Assessed - Engineer, mining and Hygiene: Sit on 3-in-1 or toilet;Lean right and/or left Equipment Used: Rolling walker Transfers/Ambulation Related to ADLs: ambulated to bathroom with min guard.  Cued for walker safety:  pt is not used to using an AD.   ADL Comments: Pt had completed adl this am.  discussed energy conservation, pacing and rest breaks.  Dyspnea 2/4; sats remained 94 - 98 on 02    OT Diagnosis:    OT Problem List:   OT Treatment Interventions:     OT  Goals(current goals can now be found in the care plan section)    Visit Information  Last OT Received On: 01/20/13 Assistance Needed: +1 History of Present Illness: Taylor [name] is a 57 y.o. male  admitted with respiratory distress, VDRF,     Subjective Data      Prior Functioning       Cognition  Cognition Arousal/Alertness: Awake/alert Behavior During Therapy: WFL for tasks assessed/performed Overall Cognitive Status:  (one incident decreased STM; cues for safety)    Mobility  Bed Mobility Supine to Sit: 5: Supervision;HOB elevated Details for Bed Mobility Assistance: supervision for safety--02 line/linen Transfers Sit to Stand: 4: Min guard;From bed;From toilet Stand to Sit: 4: Min guard;To chair/3-in-1;To toilet Details for Transfer Assistance: cues for hand placement    Exercises      Balance     End of Session OT - End of Session Activity Tolerance: Patient tolerated treatment well Patient left: in chair;with call bell/phone within reach  GO     [facility] 01/20/2013, 10:20 AM Marica Otter, OTR/L [phone] 01/20/2013

## 2013-01-20 NOTE — Progress Notes (Signed)
Encouraged patient to be out of bed walking in hallway several times today.  He has been sitting in the recliner and tolerating this without issue.  Continues to complain of sore throat and hoarseness

## 2013-01-20 NOTE — Progress Notes (Signed)
Name: Taylor [name] MRN: [id number] DOB: 1955/06/05    ADMISSION DATE:  01/09/2013 CONSULTATION DATE:  01/10/2013  REFERRING MD :  Everett Graff  CHIEF COMPLAINT:  Can't breath  BRIEF PATIENT DESCRIPTION:  57 yo male smoker admit with AECOPD.  Developed VDRF 12/21 and PCCM assumed care.  Followed by Dr. Sherene Sires in pulmonary office for GOLD IV COPD with emphysema.   SIGNIFICANT EVENTS: 12-20 Admit to SDU 12-21 VDRF, PCCM assumed care 12-23 hypercarbic acidosis 12-24 failed weaning 12-26 attempt extubation failed and reintubated   STUDIES:  PFT's 12/15/2012 >> FEV1 0.89 (24%) with Ratio 38 p am saba and DLCO 44% with 68  Echo 12/21 >> EF 50 to 55%, mild LVH  LINES / TUBES: ETT 12/22 >> 12-26>>12/26 reintubated>>12/28  CULTURES: Influenza PCR 12/20 >> negative Sputum 12/22 > NORMAL FLORA 12-22 bc>>NEG 12-20 uc>>55K lactobacillus 01/14/13 - RESP VIRUS PCR  (tracheal aspirate) >>meta pneumovirus  ANTIBIOTICS: Levaquin 12/20 >> 12/29 Tamiflu 12/22 >>  12/26  SUBJECTIVE:  Improving daily  VITAL SIGNS: Temp:  [97 F (36.1 C)-98.4 F (36.9 C)] 97 F (36.1 C) (12/31 0536) Pulse Rate:  [76-89] 83 (12/31 0536) Resp:  [20-30] 20 (12/31 0536) BP: (109-156)/(65-92) 146/88 mmHg (12/31 0536) SpO2:  [96 %-98 %] 98 % (12/31 0835) 2 liters [place]  INTAKE / OUTPUT: Intake/Output     12/30 0701 - 12/31 0700 12/31 0701 - 01/01 0700   P.O.     I.V. (mL/kg)     Total Intake(mL/kg)     Urine (mL/kg/hr) 1275 (0.7)    Total Output 1275     Net -1275            PHYSICAL EXAMINATION: General: no distress Neuro: alert, normal strength HEENT: mild b/l maxillary sinus tenderness Cardiovascular: regular Lungs: decreased breath sounds, faint b/l expiratory wheeze Abdomen:  Soft, non tender, + bowel sounds Extremities: no edema Skin:  No rash.   LABS: CBC Recent Labs     01/18/13  0320  WBC  9.9  HGB  13.9  HCT  41.6  PLT  159   BMET Recent Labs      01/18/13  0320  NA  142  K  3.6  CL  100  CO2  36*  BUN  42*  CREATININE  0.63  GLUCOSE  109*    Electrolytes Recent Labs     01/18/13  0320  CALCIUM  8.8   ABG No results found for this basename: PHART, PCO2ART, PO2ART,  in the last 72 hours Glucose Recent Labs     01/19/13  0801  01/19/13  1201  01/19/13  1618  01/19/13  2025  01/20/13  0005  01/20/13  0741  GLUCAP  170*  195*  107*  243*  101*  127*    Imaging No results found.  ASSESSMENT / PLAN:  PULMONARY A: Acute hypoxic/hypercapnic respiratory failure 2nd to AECOPD from Metapneumovirus. RESOLVED RESP FAILURE, now JUST chronic Continued tobacco abuse. counseled Post-extubation hoarseness. better P:   -cont  brovana, pulmicort, and spiriva -prn albuterol - prednisone 30mg /d -oxygen to keep SpO2 > 90%  CARDIOVASCULAR A:  Sinus tachycardia >> likely related to acute respiratory distress. Chest pain on admission >> cardiac enzyme negative, ECG and echo unremarkable. P:  -Monitor hemodynamics  RENAL A:   Mild hyponatremia. Resolved P:   -Monitor electrolytes  GASTROINTESTINAL A:   Nutrition. Dysphagia. P:   -D1 diet -continue protonix for SUP   HEMATOLOGIC A:   Mild  thrombocytopenia >> chronic, secondary to chemotherapy for hx of colon cancer. P:  -F/u CBC intermittently -Continue lovenox for DVT prevention  INFECTIOUS A:   AECOPD. Metapneumovirus POS .  P:   -off all ABX  ENDOCRINE A:   Steroid induced hyperglycemia. P:   - SSI while on steroid  NEUROLOGIC A:   Hx of Bipolar disorder, binge alcohol consumption >> on chronic benzo's as outpt. Deconditioning. P:   -Continue abilify, lexapro, thiamine, folic acid -PT/OT consult Pt wants to go home, not strong enough for this SNF bed search in place Keep until 01/22/13 and maybe d/c to home then -hold outpt klonopin for now  Summary: Respiratory status improving.  .  Start to mobilize, and get PT/OT.Marland Kitchen Keep on PCCM  primary svc d/t COPD dx and MW office pt   Dorcas Carrow  [facility] 01/20/2013, 9:58 AM Beeper  [phone]  Cell  [phone]  If no response or cell goes to voicemail, call beeper [phone]

## 2013-01-20 NOTE — Progress Notes (Signed)
PT Cancellation Note  Patient Details Name: Taylor [name] MRN: [id number] DOB: [birth year]   Cancelled Treatment:    Reason Eval/Treat Not Completed: Patient declined, pt states he has been walking to the desk with RW. MD Please order HHPT at DC as pt is declining SNF.  Rada Hay 01/20/2013, 4:56 PM Blanchard Kelch PT [phone]

## 2013-01-21 LAB — GLUCOSE, CAPILLARY
GLUCOSE-CAPILLARY: 122 mg/dL — AB (ref 70–99)
GLUCOSE-CAPILLARY: 150 mg/dL — AB (ref 70–99)
Glucose-Capillary: 155 mg/dL — ABNORMAL HIGH (ref 70–99)

## 2013-01-21 MED ORDER — ACETAMINOPHEN 325 MG PO TABS
650.0000 mg | ORAL_TABLET | Freq: Four times a day (QID) | ORAL | Status: DC | PRN
  Administered 2013-01-21 (×2): 650 mg via ORAL
  Filled 2013-01-21 (×2): qty 2

## 2013-01-21 NOTE — Progress Notes (Signed)
PULMONARY PROGRESS NOTE  Name: Taylor [name] MRN: [id number] DOB: [birth year]    ADMISSION DATE:  01/09/2013 CONSULTATION DATE:  01/10/2013  REFERRING MD :  Taylor [name]  CHIEF COMPLAINT:  Can't breath  BRIEF PATIENT DESCRIPTION:  58 yo male smoker admit with AECOPD.  Developed VDRF 12/21 and PCCM assumed care.  Followed by Dr. Melvyn [name] in pulmonary office for GOLD IV COPD with emphysema.   SIGNIFICANT EVENTS: 12-20 Admit to SDU 12-21 VDRF, PCCM assumed care 12-23 hypercarbic acidosis 12-24 failed weaning 12-26 attempt extubation failed and reintubated   STUDIES:  PFT's 12/15/2012 >> FEV1 0.89 (24%) with Ratio 38 p am saba and DLCO 44% with 68  Echo 12/21 >> EF 50 to 55%, mild LVH  LINES / TUBES: ETT 12/22 >> 12-26>>12/26 reintubated>>12/28  CULTURES: Influenza PCR 12/20 >> negative Sputum 12/22 > NORMAL FLORA 12-22 bc>>NEG 12-20 uc>>55K lactobacillus 01/14/13 - RESP VIRUS PCR  (tracheal aspirate) >>meta pneumovirus  ANTIBIOTICS: Levaquin 12/20 >> 12/29 Tamiflu 12/22 >>  12/26  SUBJECTIVE:  Improving daily  VITAL SIGNS: Temp:  [97.9 F (36.6 C)-98.1 F (36.7 C)] 97.9 F (36.6 C) (12/31 2117) Pulse Rate:  [80-83] 80 (12/31 2117) Resp:  [18-22] 22 (12/31 2117) BP: (128-146)/(80-83) 146/83 mmHg (12/31 2117) SpO2:  [98 %-99 %] 99 % (01/01 0750) 2 liters [place]  INTAKE / OUTPUT: Intake/Output     12/31 0701 - 01/01 0700 01/01 0701 - 01/02 0700   P.O. 240    Total Intake(mL/kg) 240 (3.4)    Urine (mL/kg/hr)     Total Output       Net +240            PHYSICAL EXAMINATION: General: no distress Neuro: alert, normal strength HEENT: mild b/l maxillary sinus tenderness Cardiovascular: regular Lungs: decreased breath sounds, faint b/l expiratory wheeze Abdomen:  Soft, non tender, + bowel sounds Extremities: no edema Skin:  No rash.   LABS: CBC No results found for this basename: WBC, HGB, HCT, PLT,  in the last 72 hours BMET No results found for  this basename: NA, K, CL, CO2, BUN, CREATININE, GLUCOSE,  in the last 72 hours  Electrolytes No results found for this basename: CALCIUM, MG, PHOS,  in the last 72 hours ABG No results found for this basename: PHART, PCO2ART, PO2ART,  in the last 72 hours Glucose Recent Labs     01/20/13  0005  01/20/13  0741  01/20/13  1201  01/20/13  1706  01/20/13  2115  01/21/13  0727  GLUCAP  101*  127*  163*  172*  118*  122*    Imaging 12/29 CXR>>  IMPRESSION:  COPD. Tiny left pleural effusion cannot be excluded. No acute abnormality otherwise noted.   ASSESSMENT / PLAN:  PULMONARY A: Acute hypoxic/hypercapnic respiratory failure 2nd to AECOPD from [place]. RESOLVED RESP FAILURE, now JUST chronic Continued tobacco abuse. counseled Post-extubation hoarseness. better P:   -cont  brovana, pulmicort, and spiriva -prn albuterol - prednisone 30mg /d -oxygen to keep SpO2 > 90%  CARDIOVASCULAR A:  Sinus tachycardia >> likely related to acute respiratory distress. Chest pain on admission >> cardiac enzyme negative, ECG and echo unremarkable. P:  -Monitor hemodynamics  RENAL A:   Mild hyponatremia. Resolved P:   -Monitor electrolytes  GASTROINTESTINAL A:   Nutrition. Dysphagia. P:   -D1 diet -continue protonix for SUP   HEMATOLOGIC A:   Mild thrombocytopenia >> chronic, secondary to chemotherapy for hx of colon cancer. P:  -F/u CBC intermittently -Continue lovenox  for DVT prevention  INFECTIOUS A:   AECOPD. Metapneumovirus POS .  P:   -off all ABX  ENDOCRINE A:   Steroid induced hyperglycemia. P:   - SSI while on steroid  NEUROLOGIC A:   Hx of Bipolar disorder, binge alcohol consumption >> on chronic benzo's as outpt. Deconditioning. P:   -Continue abilify, lexapro, thiamine, folic acid -PT/OT consult Pt wants to go home, not strong enough for this SNF bed search in place Keep until 01/22/13 and maybe d/c to home then -hold outpt klonopin for  now  Summary: Respiratory status improving.  .  Start to mobilize, and get PT/OT.Marland Kitchen Keep on PCCM primary svc d/t COPD dx and MW office pt   Taylor [name] MMD [place] 01/21/2013, 11:32 AM call beeper [phone]

## 2013-01-22 DIAGNOSIS — J449 Chronic obstructive pulmonary disease, unspecified: Secondary | ICD-10-CM

## 2013-01-22 LAB — GLUCOSE, CAPILLARY
GLUCOSE-CAPILLARY: 146 mg/dL — AB (ref 70–99)
Glucose-Capillary: 123 mg/dL — ABNORMAL HIGH (ref 70–99)

## 2013-01-22 MED ORDER — PREDNISONE 10 MG PO TABS: ORAL_TABLET | ORAL | Status: DC

## 2013-01-22 NOTE — Progress Notes (Signed)
Name: Taylor [name] MRN: [id number] DOB: [birth year]    ADMISSION DATE:  01/09/2013 CONSULTATION DATE:  01/10/2013  REFERRING MD :  Harless Litten  CHIEF COMPLAINT:  Can't breath  BRIEF PATIENT DESCRIPTION:  58 yo male smoker admit with AECOPD.  Developed VDRF 12/21 and PCCM assumed care.  Followed by Dr. Melvyn Novas in pulmonary office for GOLD IV COPD with emphysema.   SIGNIFICANT EVENTS: 12-20 Admit to SDU 12-21 VDRF, PCCM assumed care 12-23 hypercarbic acidosis 12-24 failed weaning 12-26 attempt extubation failed and reintubated   STUDIES:  PFT's 12/15/2012 >> FEV1 0.89 (24%) with Ratio 38 p am saba and DLCO 44% with 68  Echo 12/21 >> EF 50 to 55%, mild LVH  LINES / TUBES: ETT 12/22 >> 12-26>>12/26 reintubated>>12/28  CULTURES: Influenza PCR 12/20 >> negative Sputum 12/22 > NORMAL FLORA 12-22 bc>>NEG 12-20 uc>>55K lactobacillus 01/14/13 - RESP VIRUS PCR  (tracheal aspirate) >>meta pneumovirus  ANTIBIOTICS: Levaquin 12/20 >> 12/29 Tamiflu 12/22 >>  12/26  SUBJECTIVE:  Improving daily  VITAL SIGNS: Temp:  [98 F (36.7 C)-98.4 F (36.9 C)] 98 F (36.7 C) (01/02 0600) Pulse Rate:  [93-95] 93 (01/02 0600) Resp:  [16-20] 16 (01/02 0600) BP: (120-136)/(79-91) 130/88 mmHg (01/02 0600) SpO2:  [93 %-97 %] 94 % (01/02 0947) 2 liters [place]  INTAKE / OUTPUT: Intake/Output     01/01 0701 - 01/02 0700 01/02 0701 - 01/03 0700   P.O. 1320 477   I.V. (mL/kg) 3 (0)    Total Intake(mL/kg) 1323 (18.6) 477 (6.7)   Net +1323 +477        Urine Occurrence 5 x    Stool Occurrence  1 x     PHYSICAL EXAMINATION: General: no distress Neuro: alert, normal strength HEENT: mild b/l maxillary sinus tenderness Cardiovascular: regular Lungs: decreased breath sounds, faint b/l expiratory wheeze Abdomen:  Soft, non tender, + bowel sounds Extremities: no edema Skin:  No rash.   LABS: CBC No results found for this basename: WBC, HGB, HCT, PLT,  in the last 72  hours BMET No results found for this basename: NA, K, CL, CO2, BUN, CREATININE, GLUCOSE,  in the last 72 hours  Electrolytes No results found for this basename: CALCIUM, MG, PHOS,  in the last 72 hours ABG No results found for this basename: PHART, PCO2ART, PO2ART,  in the last 72 hours Glucose Recent Labs     01/20/13  1706  01/20/13  2115  01/21/13  0727  01/21/13  1146  01/21/13  1707  01/22/13  0737  GLUCAP  172*  118*  122*  150*  155*  123*    Imaging 12/29 CXR>>  IMPRESSION:  COPD. Tiny left pleural effusion cannot be excluded. No acute abnormality otherwise noted.   ASSESSMENT / PLAN:  Acute hypoxic/hypercapnic respiratory failure 2nd to AECOPD from Taylor [name]. RESOLVED RESP FAILURE, now JUST chronic Metapneumovirus POS .  Continued tobacco abuse. counseled Post-extubation hoarseness. better P:   -cont  brovana, pulmicort, and spiriva -prn albuterol - prednisone 30mg /d -oxygen to keep SpO2 > 90%   Mild thrombocytopenia >> chronic, secondary to chemotherapy for hx of colon cancer. P:  -F/u CBC intermittently -Continue lovenox for DVT prevention  Steroid induced hyperglycemia. P:   - SSI while on steroid  Hx of Bipolar disorder, binge alcohol consumption >> on chronic benzo's as outpt. Deconditioning. P:   -Continue abilify, lexapro, thiamine, folic acid -PT/OT consult -hold outpt klonopin for now  Summary: Ready for d/c   Taylor [name]  Patient seen and examined, agree with above note.  I dictated the care and orders written for this patient under my direction.  Wesam G Yacoub, MD [phone] 

## 2013-01-22 NOTE — Progress Notes (Signed)
Occupational Therapy Treatment Patient Details Name: Taylor [name] MRN: [id number] DOB: [birth year] Today's Date: 01/22/2013 Time: 5573-2202 OT Time Calculation (min): 13 min  OT Assessment / Plan / Recommendation  History of present illness Taylor [name] is a 58 y.o. male  admitted with respiratory distress, VDRF,    OT comments  Pt has been making excellent progress with strength, mobility and adls.  He plans d/c home today. Recommend HHOT  Follow Up Recommendations  Home health OT    Barriers to Discharge       Equipment Recommendations  None recommended by OT    Recommendations for Other Services    Frequency     Progress towards OT Goals Progress towards OT goals: Progressing toward goals  Plan      Precautions / Restrictions Precautions Precautions: Fall Restrictions Weight Bearing Restrictions: No   Pertinent Vitals/Pain No pain    ADL  Transfers/Ambulation Related to ADLs: pt was up in room ambulating, putting things away:  caught foot on edge of tray table; no LOB ADL Comments: Pt reports he put own clothes on.  Sister will pick him up and check on him this weekend.  Pt has made excellent progress and now has 3 to 3+/5 strength in bil shoulders.  provided theraband HEP (written) with 2 levels of band.  Educated to use level one.  Pt needs min cues to not compensate when performing exercises    OT Diagnosis:    OT Problem List:   OT Treatment Interventions:     OT Goals(current goals can now be found in the care plan section)    Visit Information  Last OT Received On: 01/22/13 Assistance Needed: +1 History of Present Illness: Taylor [name] is a 58 y.o. male  admitted with respiratory distress, VDRF,     Subjective Data      Prior Functioning       Cognition       Mobility       Exercises  Other Exercises Other Exercises: shoulder flexion, extension, horizontal abd with level one theraband, 10x each arm. Cues to isolate by keeping  elbow straight and not leaning.   Balance     End of Session OT - End of Session Activity Tolerance: Patient tolerated treatment well Patient left: in chair;with call bell/phone within reach  [place] 01/22/2013, 4:36 PM Lesle Chris, OTR/L [phone] 01/22/2013

## 2013-01-22 NOTE — Progress Notes (Signed)
PT Cancellation Note  Patient Details Name: Taylor [name] MRN: [id number] DOB: 26-Dec-1955   Cancelled Treatment:    Reason Eval/Treat Not Completed: Other (comment) Pt reports he just ambulated in hallway and now tired.  Pt also reports d/c today and did not wish to practice or participate in therapy prior to d/c.   Awanda Wilcock,KATHrine E 01/22/2013, 12:17 PM Carmelia Bake, PT, DPT 01/22/2013 Pager: [phone]

## 2013-01-22 NOTE — Discharge Summary (Signed)
Physician Discharge Summary       Patient ID: ALBARAA SWINGLE MRN: [id number] DOB/AGE: [birth year] 58 y.o.  Admit date: 01/09/2013 Discharge date: 01/22/2013  Discharge Diagnoses:  Principal Problem:   Acute respiratory failure with hypoxia Active Problems:   COPD with exacerbation   Alcohol abuse   COPD exacerbation   Chest pain   Acute bronchitis due to human metapneumovirus  Detailed Hospital Course:  DEMARION PONDEXTER is a 58 y.o. male has a past medical history of Inguinal hernia; Depression; Hemorrhoids; Full dentures; Rib fractures; Bipolar 1 disorder (02/05/2012); Shortness of breath; COPD (chronic obstructive pulmonary disease); Cancer (04/11/11); and Colon cancer (04/11/2011). Presented 12/20 with 3 day hx of worsening shortness of breath, fever, coughing up green sputum. Was admitted to step down unit. Symptoms progressed to point he required intubation on 12/21. Treated in usual fashion which included: mechanical ventilation, empiric antibiotics, systemic steroids, bronchodilators, anxiolytic and analgesic support and supportive critical care. The highlights of his hospital course is as follows: 12-20 Admit to SDU 12-21 VDRF, PCCM assumed care 12-23 hypercarbic acidosis 12-24 failed weaning 12-26 attempt extubation failed and reintubated. Ultimately he was extubated on 12/28, was watched closely in the ICU and eventually transferred to the medical ward. He was initially significantly weak but this has improved with physical therapy. He is actually ambulating in the hall now and back on room air. He is medically cleared for d/c as of 1/2 and will be discharged to home with the following plan as outlined below in regards to his active medical issues.     Acute hypoxic/hypercapnic respiratory failure 2nd to AECOPD from [place]. RESOLVED RESP FAILURE, now JUST chronic  Metapneumovirus POS .  Continued tobacco abuse. counseled  Post-extubation hoarseness. better  Plan:    -resume home BD regimenl  - prednisone taper -f/u our office   Mild thrombocytopenia >> chronic, secondary to chemotherapy for hx of colon cancer.  P:  -F/u CBC intermittently    Steroid induced hyperglycemia.  P:  -will taper steroids  Hx of Bipolar disorder, binge alcohol consumption >> on chronic benzo's as outpt.  Deconditioning.  P:  -Continue abilify, lexapro, thiamine, folic acid  -resume all home meds     Significant Hospital tests/ studies/ interventions and procedures  Consults  Discharge Exam: BP 130/88  Pulse 93  Temp(Src) 98 F (36.7 C) (Oral)  Resp 16  Ht 5\' 11"  (1.803 m)  Wt 71.2 kg (156 lb 15.5 oz)  BMI 21.90 kg/m2  SpO2 94%  General: no distress  Neuro: alert, normal strength  HEENT: mild b/l maxillary sinus tenderness  Cardiovascular: regular  Lungs: decreased breath sounds, faint b/l expiratory wheeze  Abdomen: Soft, non tender, + bowel sounds  Extremities: no edema  Skin: No rash.   Labs at discharge Lab Results  Component Value Date   CREATININE 0.63 01/18/2013   BUN 42* 01/18/2013   NA 142 01/18/2013   K 3.6 01/18/2013   CL 100 01/18/2013   CO2 36* 01/18/2013   Lab Results  Component Value Date   WBC 9.9 01/18/2013   HGB 13.9 01/18/2013   HCT 41.6 01/18/2013   MCV 99.5 01/18/2013   PLT 159 01/18/2013   Lab Results  Component Value Date   ALT 18 01/10/2013   AST 26 01/10/2013   ALKPHOS 70 01/10/2013   BILITOT 0.4 01/10/2013   Lab Results  Component Value Date   INR 0.90 04/10/2011    Current radiology studies No results found.  Disposition:  01-Home or Self Care       Future Appointments Provider Department Dept Phone   01/25/2013 10:30 AM [name], NP [facility] [phone]       Medication List         ARIPiprazole 2 MG tablet  Commonly known as:  ABILIFY  Take 2 mg by mouth daily.     clonazePAM 0.5 MG tablet  Commonly known as:  KLONOPIN  Take 0.5 mg by mouth every morning.      escitalopram 20 MG tablet  Commonly known as:  LEXAPRO  Take 20 mg by mouth every morning.     mometasone-formoterol 200-5 MCG/ACT Aero  Commonly known as:  DULERA  Take 2 puffs first thing in am and then another 2 puffs about 12 hours later.     predniSONE 10 MG tablet  Commonly known as:  DELTASONE  Take 3 tabs daily for 4 days, then 2 tabs daily for 4 days, then 1 tab daily for 4 days and d/c     tiotropium 18 MCG inhalation capsule  Commonly known as:  SPIRIVA HANDIHALER  One capsule each am on wakening       Follow-up Information   Follow up with PARRETT,TAMMY, NP On 01/25/2013. (1030)    Specialty:  Nurse Practitioner   Contact information:   [address] [place] [address] [phone]       Discharged Condition: good  Physician Statement:   The Patient was personally examined, the discharge assessment and plan has been personally reviewed and I agree with ACNP Autumn Gunn's assessment and plan. > 30 minutes of time have been dedicated to discharge assessment, planning and discharge instructions.   Signed: Divya Munshi,[name] 01/22/2013, 11:15 AM

## 2013-01-22 NOTE — Progress Notes (Signed)
Discharge instructions explained to pt. He states understanding. Discharged to sister, she'll be taking him home.

## 2013-01-22 NOTE — Progress Notes (Signed)
[id number]/ENIDPO Rosana Hoes, RN,BSN,CCM: Case management. [phone] Anticipating dc of patient to home with sister later today.

## 2013-01-22 NOTE — Progress Notes (Signed)
Speech Language Pathology Treatment: Dysphagia  Patient Details Name: Taylor [name] MRN: [id number] DOB: 1955-01-22 Today's Date: 01/22/2013 Time: 2909-0301 SLP Time Calculation (min): 18 min  Assessment / Plan / Recommendation Clinical Impression  Pt to dc home today- final education re: swallow ability and mitigation strategies completed.  Teach back utilized to assure comprehension.  Pt now admits to occasional aspiration of liquids during hospital coarse- mentioning coffee to be specific.  Review of possible chin tuck if prevents cough and assuring pt not short of breath with intake.  Xerostomia compensations reviewed as well.  Voice remains mildly hoarse, but much improved.  Good intake with adequate tolerance.  All education completed and pt with written documents for future referral- SLP to sign off, please reorder if desire.     HPI HPI: 58 year old male with COPD, depression, Inguinal hernia, bipolar disorder, ETOH,  admitted 12/20 with respiratory failure requiring intubation. Failed weaning 12/24, attempted extubation 12/26 however failed and reintubated. Extubated 12/28 am.  Pt underwent BSE on 01/18/13 due to aphonia and was started on dys3/thin diet.  Today pt seen for skilled SLP treatment to aid in managing dysphagia and to assure all education completed.     Pertinent Vitals Afebrile, decreased  SLP Plan  All goals met    Recommendations Diet recommendations: Regular;Thin liquid Liquids provided via: Cup;Straw Medication Administration:  (as tolerated) Supervision: Patient able to self feed Compensations: Slow rate;Small sips/bites Postural Changes and/or Swallow Maneuvers: Seated upright 90 degrees              Oral Care Recommendations: Oral care BID Follow up Recommendations: None Plan: All goals met    [place], [place] [facility]

## 2013-01-23 LAB — GLUCOSE, CAPILLARY: Glucose-Capillary: 198 mg/dL — ABNORMAL HIGH (ref 70–99)

## 2013-01-25 ENCOUNTER — Encounter: Payer: Self-pay | Admitting: Adult Health

## 2013-01-25 ENCOUNTER — Ambulatory Visit (INDEPENDENT_AMBULATORY_CARE_PROVIDER_SITE_OTHER): Payer: Medicaid Other | Admitting: Adult Health

## 2013-01-25 VITALS — BP 130/88 | HR 88 | Temp 98.0°F | Ht 70.0 in | Wt 180.6 lb

## 2013-01-25 DIAGNOSIS — Z23 Encounter for immunization: Secondary | ICD-10-CM

## 2013-01-25 DIAGNOSIS — J441 Chronic obstructive pulmonary disease with (acute) exacerbation: Secondary | ICD-10-CM

## 2013-01-25 MED ORDER — MOMETASONE FURO-FORMOTEROL FUM 200-5 MCG/ACT IN AERO: INHALATION_SPRAY | RESPIRATORY_TRACT | Status: DC

## 2013-01-25 NOTE — Addendum Note (Signed)
Addended by: Parke Poisson E on: 01/25/2013 04:55 PM   Modules accepted: Orders

## 2013-01-25 NOTE — Progress Notes (Signed)
Subjective:    Patient ID: Taylor [name], male    DOB: January 04, 1956 MRN: [id number]       Brief patient profile:  89 yowm active smoker sinus congestion with spring just in 2014 just took a few clariton referred by ER for sob after evaluation there for ? Ihd> ruled out clinically but GOLD IV COPD documented 12/15/2012   Previous eval at [facility] for sob Jan 2014 Admit date: 02/03/2012  Discharge date: 02/06/2012    Recommendations for Outpatient Follow-up:  1. Patient is to followup with PCP one week post discharge. On followup a basic metabolic profile will need to be obtained to followup on patient's electrolytes and renal function. Patient has been started on Spiriva and Symbicort during this hospitalization and his COPD exacerbation needs to be reassessed on followup. Patient may benefit from referral to pulmonologist as outpatient. 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 CT scan chest 05/15/2011 showed no evidence of metastatic disease in the chest. His preoperative CEA was 2.4 in March 2013 and again normal in October 2013.  Patient presented today with worsening shortness of breath over past few days prior to this admission associated with cough and subjective fever patient reported productive cough, yellowish  sputum. Patient tried using albuterol with only minimal symptomatic relief. Patient decided to come to emergency for further evaluation.  In ED, evaluation included chest x-ray which showed emphysematous changes consistent with COPD and/or asthma. Patient was given nebulizer treatments and Solu-Medrol in emergency room but continued to be short of breath with wheezing on physical exam.  Hospital Course:  #1 acute COPD exacerbation  Patient had presented with worsening shortness of breath a few days prior to admission with cough and subjective fevers of yellowish sputum. Patient was admitted to the telemetry floor he was placed on nebulizer treatments as well as IV Solu-Medrol. Bronchodilators were added to his regimen as well as oral Levaquin. Patient improved clinically on a daily basis his IV steroids were tapered down. Patient will be discharged home on a steroid taper, Spiriva, Symbicort, and albuterol MDI as well as 5 days of oral Levaquin. Patient be discharged home in stable and improved condition to followup with his PCP as outpatient.   09/17/2012 1st [facility] office visit/ Wert cc doe 100 ft flat stops half way to mailbox all summer  2014 and no better with saba  Assoc with nasal congestion. Has luq pain worse with eat, better when lie down, not with exertion rec Start tudorza one twice daily  Only use your albuterol as a rescue medication   12/15/2012 f/u ov/Wert still smoking  re: GOLD IV COPD  Chief Complaint  Patient presents with  . Follow-up    PFT done today.  Feels Qvar is not helping using rescue inhaler twice as often.  albuterol use first thing in  am 5am does help breathing and chest congestion  some LUQ pain resolved.  Doe x > slow adls >>d/c qvar , rx Dulera. Cont on Spiriva   01/25/2013 [facility] follow up  Admitted 12/20-01/22/13 for VDRF , COPD exacerbation , hypoxia, bronchitis d/t human metapenumovirus.  Tx w/ brief vent support., abx, steroids and BD.   Discharged on steroid taper.  PT coming to home.  Has not had flu shot.  Depends on samples , although he has medicaid.  Discussed medication compliance.  Did not pick up prednisone rx , plans on going after today's visit.  Denies chest pain, hemoptysis, edema or n/v/d.    Current Medications, Allergies, Past Medical History, Past Surgical History, Family History, and Social History were reviewed in [employer] record.   ROS  The following are not active complaints unless bolded sore throat, dysphagia, dental problems, itching, sneezing,  nasal congestion or excess/ purulent secretions, ear ache,   fever, chills, sweats, unintended wt loss, pleuritic or exertional cp, hemoptysis,  orthopnea pnd or leg swelling, presyncope, palpitations, heartburn, abdominal pain, anorexia, nausea, vomiting, diarrhea  or change in bowel or urinary habits, change in stools or urine, dysuria,hematuria,  rash, arthralgias, visual complaints, headache, numbness weakness or ataxia or problems with walking or coordination,  change in mood/affect or memory.                  Objective:   Physical Exam  Anxious amb wm nad      HEENT mild turbinate edema.  Oropharynx no thrush or excess pnd or cobblestoning.  No JVD or cervical adenopathy. Mild accessory muscle hypertrophy. Trachea midline, nl thryroid. Chest was hyperinflated by percussion with diminished breath sounds and moderate increased exp time without wheeze. Hoover sign positive at mid inspiration. Regular rate and rhythm without murmur gallop or rub or increase P2 or edema.  Abd: no hsm, nl excursion. Ext warm without cyanosis or clubbing.     01/17/13 CXR clear lungs     Assessment & Plan:

## 2013-01-25 NOTE — Patient Instructions (Addendum)
Pick up  Prednisone taper as directed, begin today  Restart Dulera  2puffs Twice daily  , pick rx at pharmacy.  Continue on Spiriva 1 puff daily  Flu shot today  KEEP UP GOOD WORK ON NO SMOKING   follow up Dr. Melvyn Novas  In 4 weeks and  As needed   Please contact office for sooner follow up if symptoms do not improve or worsen or seek emergency care

## 2013-01-25 NOTE — Assessment & Plan Note (Signed)
Resolving acute flare w/ ongoing smoking and med non compliance.  VDRD resolved  Encouraged on smoking cessation   Plan  Pick up  Prednisone taper as directed, begin today  Restart Dulera  2puffs Twice daily  , pick rx at pharmacy.  Continue on Spiriva 1 puff daily  Flu shot today  KEEP UP GOOD WORK ON NO SMOKING   follow up Dr. Melvyn Novas  In 4 weeks and  As needed   Please contact office for sooner follow up if symptoms do not improve or worsen or seek emergency care

## 2013-02-09 NOTE — Discharge Summary (Signed)
Patient seen and examined, agree with above note.  I dictated the care and orders written for this patient under my direction.  Janelli Welling G Nicle Connole, MD [phone] 

## 2013-02-16 ENCOUNTER — Telehealth: Payer: Self-pay | Admitting: Internal Medicine

## 2013-02-16 MED ORDER — TIOTROPIUM BROMIDE MONOHYDRATE 18 MCG IN CAPS: ORAL_CAPSULE | RESPIRATORY_TRACT | Status: DC

## 2013-02-16 NOTE — Telephone Encounter (Signed)
Pt needs refill on spiriva. rx sent. Pt aware. Ogden Bing, CMA

## 2013-02-22 ENCOUNTER — Ambulatory Visit (INDEPENDENT_AMBULATORY_CARE_PROVIDER_SITE_OTHER): Payer: Medicaid Other | Admitting: Internal Medicine

## 2013-02-22 ENCOUNTER — Encounter: Payer: Self-pay | Admitting: Internal Medicine

## 2013-02-22 VITALS — BP 104/60 | HR 83 | Temp 98.0°F | Ht 70.0 in | Wt 186.0 lb

## 2013-02-22 DIAGNOSIS — J449 Chronic obstructive pulmonary disease, unspecified: Secondary | ICD-10-CM

## 2013-02-22 DIAGNOSIS — F17200 Nicotine dependence, unspecified, uncomplicated: Secondary | ICD-10-CM

## 2013-02-22 NOTE — Patient Instructions (Addendum)
Work on inhaler technique:  relax and gently blow all the way out then take a nice smooth deep breath back in, triggering the inhaler at same time you start breathing in.  Hold for up to 5 seconds if you can.  Rinse and gargle with water when done  The key is to stop smoking completely before smoking completely stops you!   Please schedule a follow up visit in 3 months but call sooner if needed  With pft's on return

## 2013-02-22 NOTE — Progress Notes (Signed)
Subjective:    Patient ID: Taylor [name], male    DOB: January 09, 1956 MRN: [id number]    Brief patient profile:  52 yowm active smoker sinus congestion with spring just in 2014 took a few clariton referred by ER for sob after evaluation there for ? Ihd> ruled out clinically but GOLD IV COPD documented 12/15/2012    History of Present Illness   Previous eval at [facility] for sob Jan 2014 Admit date: 02/03/2012  Discharge date: 02/06/2012  Discharge Diagnoses:  Principal Problem:  *COPD (chronic obstructive pulmonary disease)  Active Problems:  Colon cancer, sigmoid  Thrombocytopenia  Hyperglycemia  Dyspepsia  Bipolar 1 disorder  Depression  Discharge Condition: Stable and improved  Diet recommendation: Regular  Filed Weights    02/04/12 0550  02/05/12 0500  02/06/12 0500   Weight:  82.8 kg (182 lb 8.7 oz)  82.9 kg (182 lb 12.2 oz)  84.3 kg (185 lb 13.6 oz)   History of present illness:  58 year old male with 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 CT scan chest 05/15/2011 showed no evidence of metastatic disease in the chest. His preoperative CEA was 2.4 in March 2013 and again normal in October 2013.  Patient presented today with worsening shortness of breath over past few days prior to this admission associated with cough and subjective fever patient reported productive cough, yellowish sputum. Patient tried using albuterol with only minimal symptomatic relief. Patient decided to come to emergency for further evaluation.  In ED, evaluation included chest x-ray which showed emphysematous changes consistent with COPD and/or asthma. Patient was given nebulizer treatments and Solu-Medrol in emergency room but continued to be short of breath with wheezing on physical exam.  Hospital Course:  #1  acute COPD exacerbation  Patient had presented with worsening shortness of breath a few days prior to admission with cough and subjective fevers of yellowish sputum. Patient was admitted to the telemetry floor he was placed on nebulizer treatments as well as IV Solu-Medrol. Bronchodilators were added to his regimen as well as oral Levaquin. Patient improved clinically on a daily basis his IV steroids were tapered down. Patient will be discharged home on a steroid taper, Spiriva, Symbicort, and albuterol MDI as well as 5 days of oral Levaquin. Patient be discharged home in stable and improved condition to followup with his PCP as outpatient.   09/17/2012 1st [facility] office visit/ Wert cc doe 100 ft flat stops half way to mailbox all summer  2014 and no better with saba  Assoc with nasal congestion. Has luq pain worse with eat, better when lie down, not with exertion rec Start tudorza one twice daily  Only use your albuterol as a rescue medication   12/15/2012 f/u ov/Wert still smoking  re: GOLD IV COPD  Chief Complaint  Patient presents with  . Follow-up    PFT done today.  Feels Qvar is not helping using rescue inhaler twice as often.  albuterol use first thing in am 5am does help breathing and chest congestion  some LUQ pain resolved.  Doe x > slow adls >>d/c qvar , rx Dulera. Cont on Spiriva   01/25/2013 [facility] follow up  Admitted 12/20-01/22/13 for VDRF , COPD exacerbation , hypoxia, bronchitis d/t human metapenumovirus.  Tx w/ brief vent support., abx, steroids and BD.  Discharged on steroid taper.  PT coming to home.  Has not had flu shot.  Depends on samples , although he has medicaid.  Discussed medication compliance.  Did not pick up prednisone rx  rec Pick up  Prednisone taper as directed, begin today  Restart Dulera  2puffs Twice daily  , pick rx at pharmacy.  Continue on Spiriva 1 puff daily  Flu shot today   02/22/2013 f/u ov/Wert re: GOLD IV COPD/ still  smoking / no worse since last rx prednisone complete  Chief Complaint  Patient presents with  . Follow-up    Pt states that his breathing is much improved since the last visit. He is ready to quit smoking and asks about starting chantix.   can walk slowly shopping including [employer]/gets let off at door / does not have rescue inhaler   No obvious day to day or daytime variabilty or assoc chronic cough or cp or chest tightness, subjective wheeze overt sinus or hb symptoms. No unusual exp hx or h/o childhood pna/ asthma or knowledge of premature birth.  Sleeping ok without nocturnal  or early am exacerbation  of respiratory  c/o's or need for noct saba. Also denies any obvious fluctuation of symptoms with weather or environmental changes or other aggravating or alleviating factors except as outlined above   Current Medications, Allergies, Complete Past Medical History, Past Surgical History, Family History, and Social History were reviewed in [employer] record.  ROS  The following are not active complaints unless bolded sore throat, dysphagia, dental problems, itching, sneezing,  nasal congestion or excess/ purulent secretions, ear ache,   fever, chills, sweats, unintended wt loss, pleuritic or exertional cp, hemoptysis,  orthopnea pnd or leg swelling, presyncope, palpitations, heartburn, abdominal pain, anorexia, nausea, vomiting, diarrhea  or change in bowel or urinary habits, change in stools or urine, dysuria,hematuria,  rash, arthralgias, visual complaints, headache, numbness weakness or ataxia or problems with walking or coordination,  change in mood/affect or memory.                       Objective:   Physical Exam  Anxious amb wm nad   Wt Readings from Last 3 Encounters:  02/22/13 186 lb (84.369 kg)  01/25/13 180 lb 9.6 oz (81.92 kg)  01/18/13 156 lb 15.5 oz (71.2 kg)        HEENT mild turbinate edema.  Oropharynx no thrush or excess pnd or  cobblestoning.  No JVD or cervical adenopathy. Mild accessory muscle hypertrophy. Trachea midline, nl thryroid. Chest was hyperinflated by percussion with diminished breath sounds and moderate increased exp time without wheeze. Hoover sign positive at mid inspiration. Regular rate and rhythm without murmur gallop or rub or increase P2 or edema.  Abd: no hsm, nl excursion. Ext warm without cyanosis or clubbing.     01/17/13 CXR clear lungs     Assessment & Plan:

## 2013-02-22 NOTE — Assessment & Plan Note (Signed)
-   09/17/2012  Walked RA x 3 laps @ 185 ft each stopped due to  End of study, no desat - Spirometry 09/17/2012 FEV1  0.80 (21%) ratio 52  - PFT's 12/15/2012  FEV1  0.89 (24%) with Ratio 38 p am saba and DLCO 44% corrects to 68%  - Alpha one screen 12/15/2012 >  MM  DDX of  difficult airways managment all start with A and  include Adherence, Ace Inhibitors, Acid Reflux, Active Sinus Disease, Alpha 1 Antitripsin deficiency, Anxiety masquerading as Airways dz,  ABPA,  allergy(esp in young), Aspiration (esp in elderly), Adverse effects of DPI,  Active smokers, plus two Bs  = Bronchiectasis and Beta blocker use..and one C= CHF  Adherence is always the initial "prime suspect" and is a multilayered concern that requires a "trust but verify" approach in every patient - starting with knowing how to use medications, especially inhalers, correctly, keeping up with refills and understanding the fundamental difference between maintenance and prns vs those medications only taken for a very short course and then stopped and not refilled.  The proper method of use, as well as anticipated side effects, of a metered-dose inhaler are discussed and demonstrated to the patient. Improved effectiveness after extensive coaching during this visit to a level of approximately  75% so continue dulera 200 2bid and spiriva  Active smoking > see smoking a/p

## 2013-02-22 NOTE — Assessment & Plan Note (Signed)
>   3 min discussion   Counseled on impt of quitting and the problems of using chantix > defer to psych since on so may other cns active meds

## 2013-06-02 ENCOUNTER — Emergency Department (HOSPITAL_COMMUNITY)
Admission: EM | Admit: 2013-06-02 | Discharge: 2013-06-03 | Disposition: A | Discharge status: Home / Self Care | Payer: Medicaid Other | Attending: Emergency Medicine | Admitting: Emergency Medicine

## 2013-06-02 ENCOUNTER — Encounter (HOSPITAL_COMMUNITY): Payer: Self-pay | Admitting: Emergency Medicine

## 2013-06-02 ENCOUNTER — Emergency Department (HOSPITAL_COMMUNITY): Payer: Medicaid Other

## 2013-06-02 DIAGNOSIS — Z79899 Other long term (current) drug therapy: Secondary | ICD-10-CM | POA: Insufficient documentation

## 2013-06-02 DIAGNOSIS — R1013 Epigastric pain: Secondary | ICD-10-CM

## 2013-06-02 DIAGNOSIS — Z8679 Personal history of other diseases of the circulatory system: Secondary | ICD-10-CM | POA: Insufficient documentation

## 2013-06-02 DIAGNOSIS — IMO0001 Reserved for inherently not codable concepts without codable children: Secondary | ICD-10-CM

## 2013-06-02 DIAGNOSIS — J441 Chronic obstructive pulmonary disease with (acute) exacerbation: Secondary | ICD-10-CM | POA: Insufficient documentation

## 2013-06-02 DIAGNOSIS — F17200 Nicotine dependence, unspecified, uncomplicated: Secondary | ICD-10-CM | POA: Insufficient documentation

## 2013-06-02 DIAGNOSIS — Z9049 Acquired absence of other specified parts of digestive tract: Secondary | ICD-10-CM | POA: Insufficient documentation

## 2013-06-02 DIAGNOSIS — Z7982 Long term (current) use of aspirin: Secondary | ICD-10-CM | POA: Insufficient documentation

## 2013-06-02 DIAGNOSIS — Z8781 Personal history of (healed) traumatic fracture: Secondary | ICD-10-CM | POA: Insufficient documentation

## 2013-06-02 DIAGNOSIS — Z933 Colostomy status: Secondary | ICD-10-CM | POA: Insufficient documentation

## 2013-06-02 DIAGNOSIS — Z85038 Personal history of other malignant neoplasm of large intestine: Secondary | ICD-10-CM | POA: Insufficient documentation

## 2013-06-02 DIAGNOSIS — Z8719 Personal history of other diseases of the digestive system: Secondary | ICD-10-CM | POA: Insufficient documentation

## 2013-06-02 DIAGNOSIS — IMO0002 Reserved for concepts with insufficient information to code with codable children: Secondary | ICD-10-CM | POA: Insufficient documentation

## 2013-06-02 DIAGNOSIS — F319 Bipolar disorder, unspecified: Secondary | ICD-10-CM | POA: Insufficient documentation

## 2013-06-02 LAB — CBC
HCT: 41.6 % (ref 39.0–52.0)
Hemoglobin: 14.1 g/dL (ref 13.0–17.0)
MCH: 32.5 pg (ref 26.0–34.0)
MCHC: 33.9 g/dL (ref 30.0–36.0)
MCV: 95.9 fL (ref 78.0–100.0)
Platelets: 181 10*3/uL (ref 150–400)
RBC: 4.34 MIL/uL (ref 4.22–5.81)
RDW: 13.3 % (ref 11.5–15.5)
WBC: 7 10*3/uL (ref 4.0–10.5)

## 2013-06-02 LAB — COMPREHENSIVE METABOLIC PANEL
ALBUMIN: 3.6 g/dL (ref 3.5–5.2)
ALT: 18 U/L (ref 0–53)
AST: 23 U/L (ref 0–37)
Alkaline Phosphatase: 64 U/L (ref 39–117)
BUN: 14 mg/dL (ref 6–23)
CHLORIDE: 103 meq/L (ref 96–112)
CO2: 24 mEq/L (ref 19–32)
CREATININE: 0.93 mg/dL (ref 0.50–1.35)
Calcium: 9.3 mg/dL (ref 8.4–10.5)
GFR calc Af Amer: >90 mL/min (ref >90)
GFR calc non Af Amer: >90 mL/min (ref >90)
Glucose, Bld: 101 mg/dL — ABNORMAL HIGH (ref 70–99)
Potassium: 4.5 mEq/L (ref 3.7–5.3)
Sodium: 139 mEq/L (ref 137–147)
Total Bilirubin: 0.3 mg/dL (ref 0.3–1.2)
Total Protein: 6.3 g/dL (ref 6.0–8.3)

## 2013-06-02 LAB — PROTIME-INR
INR: 1.04 (ref 0.00–1.49)
Prothrombin Time: 13.4 seconds (ref 11.6–15.2)

## 2013-06-02 LAB — APTT: aPTT: 26 seconds (ref 24–37)

## 2013-06-02 LAB — TROPONIN I: Troponin I: <0.3 ng/mL (ref <0.30)

## 2013-06-02 LAB — I-STAT TROPONIN, ED: Troponin i, poc: 0.01 ng/mL (ref 0.00–0.08)

## 2013-06-02 MED ORDER — METHYLPREDNISOLONE SODIUM SUCC 125 MG IJ SOLR
125.0000 mg | Freq: Once | INTRAMUSCULAR | Status: AC
  Administered 2013-06-02: 125 mg via INTRAVENOUS
  Filled 2013-06-02: qty 2

## 2013-06-02 MED ORDER — ALBUTEROL SULFATE (2.5 MG/3ML) 0.083% IN NEBU
5.0000 mg | INHALATION_SOLUTION | Freq: Once | RESPIRATORY_TRACT | Status: AC
  Administered 2013-06-02: 5 mg via RESPIRATORY_TRACT
  Filled 2013-06-02: qty 6

## 2013-06-02 NOTE — ED Provider Notes (Signed)
CSN: [id number]     Arrival date & time 06/02/13  2216 History   First MD Initiated Contact with Patient 06/02/13 2258     Chief Complaint  Patient presents with  . Chest Pain     (Consider location/radiation/quality/duration/timing/severity/associated sxs/prior Treatment) HPI Patient presents with 2 days of constant epigastric "burning". Is not associated with food. The pain does not radiate. He has no nausea or vomiting with this. Patient says he also has had increased shortness of breath especially on exertion. He denies any lower extremity swelling or pain. He's had a mild cough without sputum production. He denies any fevers or chills. Patient states that the burning sensation has subsided. Past Medical History  Diagnosis Date  . Inguinal hernia     RIGHT- PAINFUL  . Depression     Bipolar disorder  . Hemorrhoids   . Full dentures   . Rib fractures     hx of  . Bipolar 1 disorder 02/05/2012  . Shortness of breath   . COPD (chronic obstructive pulmonary disease)     SMOKER  . 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 Colon cancer 04/11/2011   Past Surgical History  Procedure Laterality Date  . Colostomy revision  04/11/2011    Procedure: COLON RESECTION SIGMOID;  Surgeon: [name], MD;  Location: [facility];  Service: General;  Laterality: N/A;  low anterior colon resection   . Colon surgery  04/11/11    Sigmoid colectomy  . Portacath placement    . Portacath placement  05/02/2011    Procedure: INSERTION PORT-A-CATH;  Surgeon: [name], MD;  Location: [facility];  Service: General;  Laterality: N/A;  . Inguinal hernia repair Right 05/01/2012    Procedure: HERNIA REPAIR INGUINAL ADULT;  Surgeon: [name], MD;  Location: [facility];  Service: General;  Laterality: Right;  . Insertion of mesh Right 05/01/2012    Procedure: INSERTION OF MESH;  Surgeon: [name], MD;  Location: [facility];  Service: General;  Laterality: Right;  .  Port-a-cath removal Left 05/01/2012    Procedure: REMOVAL Infusion Port;  Surgeon: [name], MD;  Location: [facility];  Service: General;  Laterality: Left;   Family History  Problem Relation Age of Onset  . Heart disease Father   . Lung cancer Maternal Uncle     smoked   History  Substance Use Topics  . Smoking status: Current Every Day Smoker -- 0.50 packs/day for 38 years    Types: Cigarettes  . Smokeless tobacco: Former Systems developer  . Alcohol Use: Yes     Comment: occasional weekends, 1 six pack or 2 six packs per weekend. Last drink 1 week ago    Review of Systems  Constitutional: Negative for fever and chills.  Respiratory: Positive for cough, shortness of breath and wheezing.   Cardiovascular: Negative for chest pain.  Gastrointestinal: Positive for abdominal pain. Negative for nausea, vomiting and diarrhea.  Musculoskeletal: Negative for back pain, neck pain and neck stiffness.  Skin: Negative for rash and wound.  Neurological: Negative for dizziness, weakness, light-headedness, numbness and headaches.  All other systems reviewed and are negative.     Allergies  Codeine  Home Medications   Prior to Admission medications   Medication Sig Start Date End Date Taking? Authorizing Provider  albuterol (PROVENTIL HFA;VENTOLIN HFA) 108 (90 BASE) MCG/ACT inhaler Inhale 2 puffs into the lungs every 6 (six) hours as needed for wheezing or  shortness of breath.   Yes Historical Provider, MD  ARIPiprazole (ABILIFY) 2 MG tablet Take 2 mg by mouth daily.   Yes Historical Provider, MD  escitalopram (LEXAPRO) 20 MG tablet Take 20 mg by mouth every morning.    Yes Historical Provider, MD  mometasone-formoterol (DULERA) 200-5 MCG/ACT AERO Take 2 puffs first thing in am and then another 2 puffs about 12 hours later. 01/25/13  Yes [name], NP  tiotropium (SPIRIVA HANDIHALER) 18 MCG inhalation capsule One capsule each am on wakening 02/16/13  Yes [name], MD  aspirin 81 MG chewable  tablet Chew 324 mg by mouth once.    Historical Provider, MD  nitroGLYCERIN (NITROSTAT) 0.4 MG SL tablet Place 0.4 mg under the tongue every 5 (five) minutes as needed for chest pain (two doses).    Historical Provider, MD   BP 134/89  Pulse 67  Temp(Src) 98.9 F (37.2 C) (Oral)  Resp 20  Ht 5\' 11"  (1.803 m)  Wt 190 lb (86.183 kg)  BMI 26.51 kg/m2  SpO2 99% Physical Exam  Nursing note and vitals reviewed. Constitutional: He is oriented to person, place, and time. He appears well-developed and well-nourished. No distress.  HENT:  Head: Normocephalic and atraumatic.  Mouth/Throat: Oropharynx is clear and moist.  Eyes: EOM are normal. Pupils are equal, round, and reactive to light.  Neck: Normal range of motion. Neck supple.  Cardiovascular: Normal rate and regular rhythm.   Pulmonary/Chest: Effort normal. No respiratory distress. He has wheezes (scattered end expiratory wheezing). He has no rales.  Abdominal: Soft. Bowel sounds are normal. He exhibits no distension and no mass. There is no tenderness. There is no rebound and no guarding.  Musculoskeletal: Normal range of motion. He exhibits no edema and no tenderness.  No calf swelling or tenderness.  Neurological: He is alert and oriented to person, place, and time.  Skin: Skin is warm and dry. No rash noted. No erythema.  Psychiatric: He has a normal mood and affect. His behavior is normal.    ED Course  Procedures (including critical care time) Labs Review Labs Reviewed  COMPREHENSIVE METABOLIC PANEL - Abnormal; Notable for the following:    Glucose, Bld 101 (*)    All other components within normal limits  APTT  CBC  PROTIME-INR  TROPONIN I  I-STAT TROPOININ, ED    Imaging Review No results found.   EKG Interpretation None      Date: 06/02/2013  Rate: 66  Rhythm: normal sinus rhythm  QRS Axis: normal  Intervals: normal  ST/T Wave abnormalities: normal  Conduction Disutrbances:none  Narrative Interpretation:    Old EKG Reviewed: unchanged Wandering baseline. No change from previous EKG the  MDM   Final diagnoses:  None   patient's symptoms have improved. He continues to have mild wheezing in the bases. Was given an inhaler in emergency department. We'll get short course of steroids. Patient has a normal EKG and troponin x2 are normal as well. I have very low suspicion for coronary artery disease. The patient describes the pain as burning and epigastric in nature. Suspect gastritis versus reflux. Will start on trial of PPI. Return precautions have been given and the patient has voiced understanding.      Julianne Rice, MD 06/03/13 [phone]

## 2013-06-02 NOTE — ED Notes (Signed)
Patient with recurrent chest pain that started two days ago, progressively getting worse.  Patient describes as a burning sensation, substernal, nonradiating.  Patient denies any shortness of breath, no nausea or vomiting but does have some lightheadedness and dizziness.  Patient is CAOx3 upon arrival.

## 2013-06-03 MED ORDER — ALBUTEROL SULFATE HFA 108 (90 BASE) MCG/ACT IN AERS
2.0000 | INHALATION_SPRAY | Freq: Once | RESPIRATORY_TRACT | Status: AC
  Administered 2013-06-03: 2 via RESPIRATORY_TRACT
  Filled 2013-06-03: qty 6.7

## 2013-06-03 MED ORDER — LANSOPRAZOLE 30 MG PO CPDR: 30.0000 mg | DELAYED_RELEASE_CAPSULE | Freq: Every day | ORAL | Status: DC

## 2013-06-03 MED ORDER — PREDNISONE 50 MG PO TABS: 50.0000 mg | ORAL_TABLET | Freq: Every day | ORAL | Status: DC

## 2013-06-03 NOTE — Discharge Instructions (Signed)
Bronchitis °Bronchitis is swelling (inflammation) of the air tubes leading to your lungs (bronchi). This causes mucus and a cough. If the swelling gets bad, you may have trouble breathing. °HOME CARE  °· Rest. °· Drink enough fluids to keep your pee (urine) clear or pale yellow (unless you have a condition where you have to watch how much you drink). °· Only take medicine as told by your doctor. If you were given antibiotic medicines, finish them even if you start to feel better. °· Avoid smoke, irritating chemicals, and strong smells. These make the problem worse. Quit smoking if you smoke. This helps your lungs heal faster. °· Use a cool mist humidifier. Change the water in the humidifier every day. You can also sit in the bathroom with hot shower running for 5 10 minutes. Keep the door closed. °· See your health care provider as told. °· Wash your hands often. °GET HELP IF: °Your problems do not get better after 1 week. °GET HELP RIGHT AWAY IF:  °· Your fever gets worse. °· You have chills. °· Your chest hurts. °· Your problems breathing get worse. °· You have blood in your mucus. °· You pass out (faint). °· You feel lightheaded. °· You have a bad headache. °· You throw up (vomit) again and again. °MAKE SURE YOU: °· Understand these instructions. °· Will watch your condition. °· Will get help right away if you are not doing well or get worse. °Document Released: 06/26/2007 Document Revised: 10/28/2012 Document Reviewed: 09/01/2012 °Exit[employer]® Patient Information ©2014 [employer]. ° °

## 2013-06-03 NOTE — ED Notes (Signed)
MD at bedside. 

## 2013-06-03 NOTE — ED Notes (Signed)
Patient calling sister for ride home

## 2013-06-15 ENCOUNTER — Telehealth: Payer: Self-pay | Admitting: Internal Medicine

## 2013-06-15 NOTE — Telephone Encounter (Signed)
LMOM x 1 

## 2013-06-15 NOTE — Telephone Encounter (Signed)
Ok to give one sample but really needs ov asap with all active meds in hand to see me or Tammy NP

## 2013-06-15 NOTE — Telephone Encounter (Signed)
Pt is interested in restarting Dulera d/t increased breathing issues x 1 month. Pt reports being seen in UC for incr SOB/wheezing and reflux--given ProAir Pt states that he has been over using the [facility] 2 puffs 2-3 times per day. Pt has run out of inhaler and cannot get a refill of this for 9 days.  Pt reports that he is having to use the Proair 2-3 times everyday. Would like recs on what to do until he can get a refill of his Dulera. I made patient aware that he is only supposed to be using the Dulera BID and no more than that and patient expressed understanding stating that he just needs help because he feels that his medications are not working well to control his breathing.  [facility] [address] Allergies  Allergen Reactions  . Codeine Nausea And Vomiting   Please advise Dr Melvyn Novas, Thanks.

## 2013-06-16 MED ORDER — MOMETASONE FURO-FORMOTEROL FUM 200-5 MCG/ACT IN AERO: 2.0000 | INHALATION_SPRAY | Freq: Two times a day (BID) | RESPIRATORY_TRACT | Status: DC

## 2013-06-16 NOTE — Telephone Encounter (Signed)
Pt calling a/b inhaler says nothing has been called in can be reach [phone].Taylor [name]

## 2013-06-16 NOTE — Telephone Encounter (Signed)
I called number listed and was told that I had the wrong number- I tried to call patient at home number listed and no answer and unable to leave a message.

## 2013-06-16 NOTE — Telephone Encounter (Signed)
Called spoke with patient and discussed MW's recommendations with him.  Pt okay with this and verbalized his understanding.  Appt scheduled with MW tomorrow morning 5/28 for 1115 - pt is aware to bring all medications with him to the appt.  Pt stated he will pick up his Dulera sample at that time; one has been set aside for him.  Nothing further needed; will sign off.

## 2013-06-17 ENCOUNTER — Encounter: Payer: Self-pay | Admitting: Internal Medicine

## 2013-06-17 ENCOUNTER — Ambulatory Visit (INDEPENDENT_AMBULATORY_CARE_PROVIDER_SITE_OTHER): Payer: Medicaid Other | Admitting: Internal Medicine

## 2013-06-17 VITALS — BP 116/72 | HR 80 | Temp 98.0°F | Ht 71.0 in | Wt 178.8 lb

## 2013-06-17 DIAGNOSIS — J449 Chronic obstructive pulmonary disease, unspecified: Secondary | ICD-10-CM

## 2013-06-17 DIAGNOSIS — F17200 Nicotine dependence, unspecified, uncomplicated: Secondary | ICD-10-CM

## 2013-06-17 MED ORDER — FAMOTIDINE 20 MG PO TABS: ORAL_TABLET | ORAL | Status: DC

## 2013-06-17 MED ORDER — PANTOPRAZOLE SODIUM 40 MG PO TBEC: 40.0000 mg | DELAYED_RELEASE_TABLET | Freq: Every day | ORAL | Status: DC

## 2013-06-17 MED ORDER — ALBUTEROL SULFATE HFA 108 (90 BASE) MCG/ACT IN AERS: INHALATION_SPRAY | RESPIRATORY_TRACT | Status: DC

## 2013-06-17 MED ORDER — PREDNISONE 10 MG PO TABS: ORAL_TABLET | ORAL | Status: DC

## 2013-06-17 NOTE — Patient Instructions (Addendum)
Prednisone 10 mg take  4 each am x 2 days,   2 each am x 2 days,  1 each am x 2 days and stop   Dulera 200 Take 2 puffs first thing in am and then another 2 puffs about 12 hours later.     Only use your albuterol (proair)as a rescue medication to be used if you can't catch your breath by resting or doing a relaxed purse lip breathing pattern.  - The less you use it, the better it will work when you need it. - Ok to use up to 2 puffs  every 4 hours if you must but call for immediate appointment if use goes up over your usual need - Don't leave home without it !!  (think of it like the spare tire for your car)   Pantoprazole (protonix) 40 mg   Take 30-60 min before first meal of the day and Pepcid 20 mg one bedtime until return to office - this is the best way to tell whether stomach acid is contributing to your problem.    GERD (REFLUX)  is an extremely common cause of respiratory symptoms, many times with no significant heartburn at all.    It can be treated with medication, but also with lifestyle changes including avoidance of late meals, excessive alcohol, smoking cessation, and avoid fatty foods, chocolate, peppermint, colas, red wine, and acidic juices such as orange juice.  NO MINT OR MENTHOL PRODUCTS SO NO COUGH DROPS  USE SUGARLESS CANDY INSTEAD (jolley ranchers or Stover's)  NO OIL BASED VITAMINS - use powdered substitutes.    The key is to stop smoking completely before smoking completely stops you!  Keep follow up appt

## 2013-06-17 NOTE — Progress Notes (Signed)
Subjective:    Patient ID: Taylor [name], male    DOB: January 09, 1956 MRN: [id number]    Brief patient profile:  52 yowm active smoker sinus congestion with spring just in 2014 took a few clariton referred by ER for sob after evaluation there for ? Ihd> ruled out clinically but GOLD IV COPD documented 12/15/2012    History of Present Illness   Previous eval at [facility] for sob Jan 2014 Admit date: 02/03/2012  Discharge date: 02/06/2012  Discharge Diagnoses:  Principal Problem:  *COPD (chronic obstructive pulmonary disease)  Active Problems:  Colon cancer, sigmoid  Thrombocytopenia  Hyperglycemia  Dyspepsia  Bipolar 1 disorder  Depression  Discharge Condition: Stable and improved  Diet recommendation: Regular  Filed Weights    02/04/12 0550  02/05/12 0500  02/06/12 0500   Weight:  82.8 kg (182 lb 8.7 oz)  82.9 kg (182 lb 12.2 oz)  84.3 kg (185 lb 13.6 oz)   History of present illness:  58 year old male with 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 CT scan chest 05/15/2011 showed no evidence of metastatic disease in the chest. His preoperative CEA was 2.4 in March 2013 and again normal in October 2013.  Patient presented today with worsening shortness of breath over past few days prior to this admission associated with cough and subjective fever patient reported productive cough, yellowish sputum. Patient tried using albuterol with only minimal symptomatic relief. Patient decided to come to emergency for further evaluation.  In ED, evaluation included chest x-ray which showed emphysematous changes consistent with COPD and/or asthma. Patient was given nebulizer treatments and Solu-Medrol in emergency room but continued to be short of breath with wheezing on physical exam.  Hospital Course:  #1  acute COPD exacerbation  Patient had presented with worsening shortness of breath a few days prior to admission with cough and subjective fevers of yellowish sputum. Patient was admitted to the telemetry floor he was placed on nebulizer treatments as well as IV Solu-Medrol. Bronchodilators were added to his regimen as well as oral Levaquin. Patient improved clinically on a daily basis his IV steroids were tapered down. Patient will be discharged home on a steroid taper, Spiriva, Symbicort, and albuterol MDI as well as 5 days of oral Levaquin. Patient be discharged home in stable and improved condition to followup with his PCP as outpatient.   09/17/2012 1st [facility] office visit/ Wert cc doe 100 ft flat stops half way to mailbox all summer  2014 and no better with saba  Assoc with nasal congestion. Has luq pain worse with eat, better when lie down, not with exertion rec Start tudorza one twice daily  Only use your albuterol as a rescue medication   12/15/2012 f/u ov/Wert still smoking  re: GOLD IV COPD  Chief Complaint  Patient presents with  . Follow-up    PFT done today.  Feels Qvar is not helping using rescue inhaler twice as often.  albuterol use first thing in am 5am does help breathing and chest congestion  some LUQ pain resolved.  Doe x > slow adls >>d/c qvar , rx Dulera. Cont on Spiriva   01/25/2013 [facility] follow up  Admitted 12/20-01/22/13 for VDRF , COPD exacerbation , hypoxia, bronchitis d/t human metapenumovirus.  Tx w/ brief vent support., abx, steroids and BD.  Discharged on steroid taper.  PT coming to home.  Has not had flu shot.  Depends on samples , although he has medicaid.  Discussed medication compliance.  Did not pick up prednisone rx  rec Pick up  Prednisone taper as directed, begin today  Restart Dulera  2puffs Twice daily  , pick rx at pharmacy.  Continue on Spiriva 1 puff daily  Flu shot today   02/22/2013 f/u ov/Wert re: GOLD IV COPD/ still  smoking / no worse since last rx prednisone complete  Chief Complaint  Patient presents with  . Follow-up    Pt states that his breathing is much improved since the last visit. He is ready to quit smoking and asks about starting chantix.   can walk slowly shopping including [employer]/gets let off at door / does not have rescue inhaler rec Work on inhaler technique:  relax and gently blow all the way out then take a nice smooth deep breath back in, triggering the inhaler at same time you start breathing in.  Hold for up to 5 seconds if you can.  Rinse and gargle with water when done The key is to stop smoking completely before smoking completely stops you!  Please schedule a follow up visit in 3 months but call sooner if needed  With pft's on return    06/17/2013 f/u ov/Wert re: GOLD IV/ still smoking / supposed to be on spiriva/dulera  Chief Complaint  Patient presents with  . Acute Visit    Pt c/o increased SOB and chest tightness for the past month.  He is using proair approx 4 times per day.  He also c/o prod cough with minimal clear sputum.   confused with meds by name/ most of his cough is am worse in cold weather   No obvious day to day or daytime variabilty or assoc chronic cough or cp or chest tightness, subjective wheeze overt sinus or hb symptoms. No unusual exp hx or h/o childhood pna/ asthma or knowledge of premature birth.  Sleeping ok without nocturnal  or early am exacerbation  of respiratory  c/o's or need for noct saba. Also denies any obvious fluctuation of symptoms with weather or environmental changes or other aggravating or alleviating factors except as outlined above   Current Medications, Allergies, Complete Past Medical History, Past Surgical History, Family History, and Social History were reviewed in [employer] record.  ROS  The following are not active complaints unless bolded sore throat, dysphagia, dental problems, itching, sneezing,  nasal  congestion or excess/ purulent secretions, ear ache,   fever, chills, sweats, unintended wt loss, pleuritic or exertional cp, hemoptysis,  orthopnea pnd or leg swelling, presyncope, palpitations, heartburn, abdominal pain, anorexia, nausea, vomiting, diarrhea  or change in bowel or urinary habits, change in stools or urine, dysuria,hematuria,  rash, arthralgias, visual complaints, headache, numbness weakness or ataxia or problems with walking or coordination,  change in mood/affect or memory.                   Objective:   Physical Exam  Anxious amb wm nad    06/17/2013       178  Wt Readings from Last 3 Encounters:  02/22/13 186 lb (84.369 kg)  01/25/13 180 lb 9.6 oz (81.92 kg)  01/18/13 156 lb 15.5 oz (71.2 kg)        HEENT mild turbinate edema.  Oropharynx no thrush or excess pnd or cobblestoning.  No JVD or cervical adenopathy. Mild accessory muscle hypertrophy. Trachea midline,  nl thryroid. Chest was hyperinflated by percussion with diminished breath sounds and moderate increased exp time without wheeze. Hoover sign positive at mid inspiration. Regular rate and rhythm without murmur gallop or rub or increase P2 or edema.  Abd: no hsm, nl excursion. Ext warm without cyanosis or clubbing.     cxr 06/02/13  No active disease.     Assessment & Plan:

## 2013-06-18 NOTE — Assessment & Plan Note (Signed)
-   09/17/2012  Walked RA x 3 laps @ 185 ft each stopped due to  End of study, no desat - Spirometry 09/17/2012 FEV1  0.80 (21%) ratio 52  - PFT's 12/15/2012  FEV1  0.89 (24%) with Ratio 38 p am saba and DLCO 44% corrects to 68%  - Alpha one screen 12/15/2012 >  MM  DDX of  difficult airways managment all start with A and  include Adherence, Ace Inhibitors, Acid Reflux, Active Sinus Disease, Alpha 1 Antitripsin deficiency, Anxiety masquerading as Airways dz,  ABPA,  allergy(esp in young), Aspiration (esp in elderly), Adverse effects of DPI,  Active smokers, plus two Bs  = Bronchiectasis and Beta blocker use..and one C= CHF  Adherence is always the initial "prime suspect" and is a multilayered concern that requires a "trust but verify" approach in every patient - starting with knowing how to use medications, especially inhalers, correctly, keeping up with refills and understanding the fundamental difference between maintenance and prns vs those medications only taken for a very short course and then stopped and not refilled.  The proper method of use, as well as anticipated side effects, of a metered-dose inhaler are discussed and demonstrated to the patient. Improved effectiveness after extensive coaching during this visit to a level of approximately  75% limited by short Ti  Active smoking the other big concern > see smoking A/P

## 2013-06-18 NOTE — Assessment & Plan Note (Signed)

## 2013-07-01 ENCOUNTER — Ambulatory Visit: Payer: Medicaid Other | Admitting: Internal Medicine

## 2013-07-15 ENCOUNTER — Other Ambulatory Visit: Payer: Self-pay | Admitting: Adult Health

## 2013-08-06 ENCOUNTER — Ambulatory Visit: Payer: Medicaid Other | Admitting: Internal Medicine

## 2013-08-06 ENCOUNTER — Ambulatory Visit (INDEPENDENT_AMBULATORY_CARE_PROVIDER_SITE_OTHER): Payer: Medicaid Other | Admitting: Internal Medicine

## 2013-08-06 DIAGNOSIS — J449 Chronic obstructive pulmonary disease, unspecified: Secondary | ICD-10-CM

## 2013-08-06 NOTE — Progress Notes (Signed)
PFT done today. Taylor [name],CMA  

## 2013-08-07 ENCOUNTER — Encounter: Payer: Self-pay | Admitting: Internal Medicine

## 2013-08-08 ENCOUNTER — Encounter: Payer: Self-pay | Admitting: Internal Medicine

## 2013-08-08 LAB — PULMONARY FUNCTION TEST
DL/VA % pred: 68 %
DL/VA: 3.18 ml/min/mmHg/L
DLCO unc % pred: 50 %
DLCO unc: 16.17 ml/min/mmHg
FEF 25-75 POST: 0.49 L/s
FEF 25-75 PRE: 0.46 L/s
FEF2575-%CHANGE-POST: 7 %
FEF2575-%PRED-PRE: 15 %
FEF2575-%Pred-Post: 16 %
FEV1-%Change-Post: 1 %
FEV1-%Pred-Post: 32 %
FEV1-%Pred-Pre: 32 %
FEV1-POST: 1.2 L
FEV1-PRE: 1.18 L
FEV1FVC-%CHANGE-POST: 2 %
FEV1FVC-%PRED-PRE: 52 %
FEV6-%CHANGE-POST: 2 %
FEV6-%Pred-Post: 60 %
FEV6-%Pred-Pre: 59 %
FEV6-Post: 2.77 L
FEV6-Pre: 2.72 L
FEV6FVC-%Change-Post: 3 %
FEV6FVC-%Pred-Post: 98 %
FEV6FVC-%Pred-Pre: 95 %
FVC-%CHANGE-POST: -1 %
FVC-%Pred-Post: 61 %
FVC-%Pred-Pre: 61 %
FVC-Post: 2.94 L
FVC-Pre: 2.97 L
POST FEV1/FVC RATIO: 41 %
PRE FEV1/FVC RATIO: 40 %
Post FEV6/FVC ratio: 94 %
Pre FEV6/FVC Ratio: 91 %
RV % pred: 185 %
RV: 4.09 L
TLC % pred: 107 %
TLC: 7.48 L

## 2013-08-09 ENCOUNTER — Encounter: Payer: Self-pay | Admitting: Internal Medicine

## 2013-08-09 ENCOUNTER — Ambulatory Visit (INDEPENDENT_AMBULATORY_CARE_PROVIDER_SITE_OTHER): Payer: Medicaid Other | Admitting: Internal Medicine

## 2013-08-09 VITALS — BP 128/75 | HR 72 | Temp 98.6°F | Ht 70.0 in | Wt 184.0 lb

## 2013-08-09 DIAGNOSIS — J4489 Other specified chronic obstructive pulmonary disease: Secondary | ICD-10-CM | POA: Diagnosis not present

## 2013-08-09 DIAGNOSIS — J449 Chronic obstructive pulmonary disease, unspecified: Secondary | ICD-10-CM | POA: Diagnosis not present

## 2013-08-09 DIAGNOSIS — F17200 Nicotine dependence, unspecified, uncomplicated: Secondary | ICD-10-CM

## 2013-08-09 MED ORDER — ALBUTEROL SULFATE HFA 108 (90 BASE) MCG/ACT IN AERS: INHALATION_SPRAY | RESPIRATORY_TRACT | Status: DC

## 2013-08-09 MED ORDER — MOMETASONE FURO-FORMOTEROL FUM 200-5 MCG/ACT IN AERO: INHALATION_SPRAY | RESPIRATORY_TRACT | Status: DC

## 2013-08-09 MED ORDER — TIOTROPIUM BROMIDE MONOHYDRATE 18 MCG IN CAPS: ORAL_CAPSULE | RESPIRATORY_TRACT | Status: DC

## 2013-08-09 MED ORDER — PANTOPRAZOLE SODIUM 40 MG PO TBEC: 40.0000 mg | DELAYED_RELEASE_TABLET | Freq: Every day | ORAL | Status: DC

## 2013-08-09 MED ORDER — FAMOTIDINE 20 MG PO TABS: ORAL_TABLET | ORAL | Status: DC

## 2013-08-09 NOTE — Assessment & Plan Note (Signed)
-   09/17/2012  Walked RA x 3 laps @ 185 ft each stopped due to  End of study, no desat - Spirometry 09/17/2012 FEV1  0.80 (21%) ratio 52  - PFT's 12/15/2012  FEV1  0.89 (24%) with Ratio 38 p am saba and DLCO 44% corrects to 68%  - PFTs 12/07/13      FEV1   1.18 (32%)  Ratio 40  And no change p saba DLCO 50 corrects to 68% took neither spiriva nor dulera before pfts - Alpha one screen 12/15/2012 >  MM  - Better but still smoking (see sep a/p) - The proper method of use, as well as anticipated side effects, of a metered-dose inhaler are discussed and demonstrated to the patient. Improved effectiveness after extensive coaching during this visit to a level of approximately  75% so continue symbicort /spiriva

## 2013-08-09 NOTE — Progress Notes (Signed)
Quick Note:  appt for review on 08/09/13 ______

## 2013-08-09 NOTE — Patient Instructions (Addendum)
Work on inhaler technique:  relax and gently blow all the way out then take a nice smooth deep breath back in, triggering the inhaler at same time you start breathing in.  Hold for up to 5 seconds if you can.  Rinse and gargle with water when done   If your mouth or throat starts to bother you,   I suggest you time the inhaler to your dental care and after using the inhaler(s) brush teeth and tongue with a baking soda containing toothpaste and when you rinse this out, gargle with it first to see if this helps your mouth and throat.     The key is to stop smoking completely before smoking completely stops you!   Restart protonix (pantaprazole) Take 30-60 min before first meal of the day   Please schedule a follow up visit in 3 months but call sooner if needed

## 2013-08-09 NOTE — Assessment & Plan Note (Addendum)
>   3 min I took an extended  opportunity with this patient to outline the consequences of continued cigarette use  in airway disorders based on all the data we have from the multiple national lung health studies (perfomed over decades at millions of dollars in cost)  indicating that smoking cessation, not choice of inhalers or physicians, is the most important aspect of care.    Note psych issues make it very unlikely he will commit/ quit unless inpt status (no access to cigs)  See instructions for specific recommendations which were reviewed directly with the patient who was given a copy with highlighter outlining the key components.

## 2013-08-09 NOTE — Progress Notes (Signed)
Subjective:    Patient ID: Taylor [name], male    DOB: February 21, 1955 MRN: [id number]    Brief patient profile:  58 yowm active smoker sinus congestion with spring just in 2014 took a few clariton referred by ER for sob after evaluation there for ? Ihd> ruled out clinically but GOLD IV COPD documented 12/15/2012    History of Present Illness   Previous eval at [facility] for sob Jan 2014 Admit date: 02/03/2012  Discharge date: 02/06/2012  Discharge Diagnoses:  Principal Problem:  *COPD (chronic obstructive pulmonary disease)  Active Problems:  Colon cancer, sigmoid  Thrombocytopenia  Hyperglycemia  Dyspepsia  Bipolar 1 disorder  Depression  Discharge Condition: Stable and improved  Diet recommendation: Regular  Filed Weights    02/04/12 0550  02/05/12 0500  02/06/12 0500   Weight:  82.8 kg (182 lb 8.7 oz)  82.9 kg (182 lb 12.2 oz)  84.3 kg (185 lb 13.6 oz)   History of present illness:  58 year old male with 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 CT scan chest 05/15/2011 showed no evidence of metastatic disease in the chest. His preoperative CEA was 2.4 in March 2013 and again normal in October 2013.  Patient presented today with worsening shortness of breath over past few days prior to this admission associated with cough and subjective fever patient reported productive cough, yellowish sputum. Patient tried using albuterol with only minimal symptomatic relief. Patient decided to come to emergency for further evaluation.  In ED, evaluation included chest x-ray which showed emphysematous changes consistent with COPD and/or asthma. Patient was given nebulizer treatments and Solu-Medrol in emergency room but continued to be short of breath with wheezing on physical exam.  Hospital Course:  #1  acute COPD exacerbation  Patient had presented with worsening shortness of breath a few days prior to admission with cough and subjective fevers of yellowish sputum. Patient was admitted to the telemetry floor he was placed on nebulizer treatments as well as IV Solu-Medrol. Bronchodilators were added to his regimen as well as oral Levaquin. Patient improved clinically on a daily basis his IV steroids were tapered down. Patient will be discharged home on a steroid taper, Spiriva, Symbicort, and albuterol MDI as well as 5 days of oral Levaquin. Patient be discharged home in stable and improved condition to followup with his PCP as outpatient.   09/17/2012 1st [facility] office visit/ Broxton Broady cc doe 100 ft flat stops half way to mailbox all summer  2014 and no better with saba  Assoc with nasal congestion. Has luq pain worse with eat, better when lie down, not with exertion rec Start tudorza one twice daily  Only use your albuterol as a rescue medication   12/15/2012 f/u ov/[place] still smoking  re: GOLD IV COPD  Chief Complaint  Patient presents with  . Follow-up    PFT done today.  Feels Qvar is not helping using rescue inhaler twice as often.  albuterol use first thing in am 5am does help breathing and chest congestion  some LUQ pain resolved.  Doe x > slow adls >>d/c qvar , rx Dulera. Cont on Spiriva   01/25/2013 [facility] follow up  Admitted 12/20-01/22/13 for VDRF , COPD exacerbation , hypoxia, bronchitis d/t human metapenumovirus.  Tx w/ brief vent support., abx, steroids and BD.  Discharged on steroid taper.  PT coming to home.  Has not had flu shot.  Depends on samples , although he has medicaid.  Discussed medication compliance.  Did not pick up prednisone rx  rec Pick up  Prednisone taper as directed, begin today  Restart Dulera  2puffs Twice daily  , pick rx at pharmacy.  Continue on Spiriva 1 puff daily  Flu shot today   02/22/2013 f/u ov/Zykee Avakian re: GOLD IV COPD/ still  smoking / no worse since last rx prednisone complete  Chief Complaint  Patient presents with  . Follow-up    Pt states that his breathing is much improved since the last visit. He is ready to quit smoking and asks about starting chantix.   can walk slowly shopping including [employer]/gets let off at door / does not have rescue inhaler rec Work on inhaler technique:    06/17/2013 f/u ov/Levy Cedano re: GOLD IV/ still smoking / supposed to be on spiriva/dulera  Chief Complaint  Patient presents with  . Acute Visit    Pt c/o increased SOB and chest tightness for the past month.  He is using proair approx 4 times per day.  He also c/o prod cough with minimal clear sputum.   confused with meds by name/ most of his cough is am worse in cold weather  rec Prednisone 10 mg take  4 each am x 2 days,   2 each am x 2 days,  1 each am x 2 days and stop  Dulera 200 Take 2 puffs first thing in am and then another 2 puffs about 12 hours later.  Only use your albuterol (proair)as a rescue medication  Pantoprazole (protonix) 40 mg   Take 30-60 min before first meal of the day and Pepcid 20 mg one bedtime until return to office GERD diet    08/09/2013 f/u ov/Bennett Ram re: COPD  III  / still smoking/ on spiriva/dulera 200 Chief Complaint  Patient presents with  . Follow-up    Pt states that his breathing has improved some since last visit. He does state that his breathing is worse when exposed to the humidity. He is using rescue inhaler 4-5 times per day.   never uses saba indoors, only when over does it outdoors, overall better to his satisfaction    No obvious day to day or daytime variabilty or assoc chronic cough or cp or chest tightness, subjective wheeze overt sinus or hb symptoms. No unusual exp hx or h/o childhood pna/ asthma or knowledge of premature birth.  Sleeping ok without nocturnal  or early am exacerbation  of respiratory  c/o's or need for noct saba. Also denies any obvious fluctuation of symptoms with  weather or environmental changes or other aggravating or alleviating factors except as outlined above   Current Medications, Allergies, Complete Past Medical History, Past Surgical History, Family History, and Social History were reviewed in [employer] record.  ROS  The following are not active complaints unless bolded sore throat, dysphagia, dental problems, itching, sneezing,  nasal congestion or excess/ purulent secretions, ear ache,   fever, chills, sweats, unintended wt loss, pleuritic or exertional cp, hemoptysis,  orthopnea pnd or leg swelling, presyncope, palpitations, heartburn, abdominal pain, anorexia, nausea, vomiting, diarrhea  or change in bowel or urinary habits, change in stools or urine, dysuria,hematuria,  rash, arthralgias, visual complaints, headache, numbness weakness or ataxia or problems with walking or coordination,  change in mood/affect or memory.  Objective:   Physical Exam  Anxious amb wm nad    06/17/2013       178  > 08/09/2013 184  Wt Readings from Last 3 Encounters:  02/22/13 186 lb (84.369 kg)  01/25/13 180 lb 9.6 oz (81.92 kg)  01/18/13 156 lb 15.5 oz (71.2 kg)        HEENT mild turbinate edema.  Oropharynx no thrush or excess pnd or cobblestoning.  No JVD or cervical adenopathy. Mild accessory muscle hypertrophy. Trachea midline, nl thryroid. Chest was hyperinflated by percussion with diminished breath sounds and moderate increased exp time without wheeze. Hoover sign positive at mid inspiration. Regular rate and rhythm without murmur gallop or rub or increase P2 or edema.  Abd: no hsm, nl excursion. Ext warm without cyanosis or clubbing.     cxr 06/02/13  No active disease.     Assessment & Plan:

## 2013-11-08 ENCOUNTER — Telehealth: Payer: Self-pay | Admitting: Internal Medicine

## 2013-11-08 NOTE — Telephone Encounter (Signed)
Pt stats he is having problems breathing & needs something asap.  Pt has appt w/ MW tomorrow @ 9:00 AM.  Pt states he has been out for about 3 days.  Please help asap.  Satira Anis

## 2013-11-08 NOTE — Telephone Encounter (Signed)
Spoke with pt - he's been out of dulera and proair x 4-5 days.   Per chart, rxs were sent for both inhalers in June 2015. Called [facility], spoke with Lattie Haw.  She verified rxs were received and will process both rxs for pt. Pt aware.   Pt c/o "spells" of increased DOE x 1 wk.  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  Believes this may have triggered the SOB.  Pt has a pending appt with MW tomorrow -- offered him OV today; pt declined and is requesting to keep tomorrow's appt.  He is aware to seek emergency care if needed.  He verbalized understanding of instructions, is in agreement with plan, and voiced no further questions or concerns at this time.

## 2013-11-09 ENCOUNTER — Ambulatory Visit (INDEPENDENT_AMBULATORY_CARE_PROVIDER_SITE_OTHER): Payer: Medicaid Other | Admitting: Internal Medicine

## 2013-11-09 ENCOUNTER — Encounter: Payer: Self-pay | Admitting: Internal Medicine

## 2013-11-09 VITALS — BP 110/80 | HR 76 | Temp 97.6°F | Ht 71.0 in | Wt 176.0 lb

## 2013-11-09 DIAGNOSIS — J449 Chronic obstructive pulmonary disease, unspecified: Secondary | ICD-10-CM

## 2013-11-09 DIAGNOSIS — Z720 Tobacco use: Secondary | ICD-10-CM | POA: Diagnosis not present

## 2013-11-09 DIAGNOSIS — F17200 Nicotine dependence, unspecified, uncomplicated: Secondary | ICD-10-CM

## 2013-11-09 MED ORDER — PREDNISONE 10 MG PO TABS: ORAL_TABLET | ORAL | Status: DC

## 2013-11-09 NOTE — Progress Notes (Signed)
Subjective:    Patient ID: Taylor [name], male    DOB: January 09, 1956 MRN: [id number]    Brief patient profile:  52 yowm active smoker sinus congestion with spring just in 2014 took a few clariton referred by ER for sob after evaluation there for ? Ihd> ruled out clinically but GOLD IV COPD documented 12/15/2012    History of Present Illness   Previous eval at [facility] for sob Jan 2014 Admit date: 02/03/2012  Discharge date: 02/06/2012  Discharge Diagnoses:  Principal Problem:  *COPD (chronic obstructive pulmonary disease)  Active Problems:  Colon cancer, sigmoid  Thrombocytopenia  Hyperglycemia  Dyspepsia  Bipolar 1 disorder  Depression  Discharge Condition: Stable and improved  Diet recommendation: Regular  Filed Weights    02/04/12 0550  02/05/12 0500  02/06/12 0500   Weight:  82.8 kg (182 lb 8.7 oz)  82.9 kg (182 lb 12.2 oz)  84.3 kg (185 lb 13.6 oz)   History of present illness:  58 year old male with 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 CT scan chest 05/15/2011 showed no evidence of metastatic disease in the chest. His preoperative CEA was 2.4 in March 2013 and again normal in October 2013.  Patient presented today with worsening shortness of breath over past few days prior to this admission associated with cough and subjective fever patient reported productive cough, yellowish sputum. Patient tried using albuterol with only minimal symptomatic relief. Patient decided to come to emergency for further evaluation.  In ED, evaluation included chest x-ray which showed emphysematous changes consistent with COPD and/or asthma. Patient was given nebulizer treatments and Solu-Medrol in emergency room but continued to be short of breath with wheezing on physical exam.  Hospital Course:  #1  acute COPD exacerbation  Patient had presented with worsening shortness of breath a few days prior to admission with cough and subjective fevers of yellowish sputum. Patient was admitted to the telemetry floor he was placed on nebulizer treatments as well as IV Solu-Medrol. Bronchodilators were added to his regimen as well as oral Levaquin. Patient improved clinically on a daily basis his IV steroids were tapered down. Patient will be discharged home on a steroid taper, Spiriva, Symbicort, and albuterol MDI as well as 5 days of oral Levaquin. Patient be discharged home in stable and improved condition to followup with his PCP as outpatient.   09/17/2012 1st [facility] office visit/ Taylor [name] cc doe 100 ft flat stops half way to mailbox all summer  2014 and no better with saba  Assoc with nasal congestion. Has luq pain worse with eat, better when lie down, not with exertion rec Start tudorza one twice daily  Only use your albuterol as a rescue medication   12/15/2012 f/u ov/Taylor [name] still smoking  re: GOLD IV COPD  Chief Complaint  Patient presents with  . Follow-up    PFT done today.  Feels Qvar is not helping using rescue inhaler twice as often.  albuterol use first thing in am 5am does help breathing and chest congestion  some LUQ pain resolved.  Doe x > slow adls >>d/c qvar , rx Dulera. Cont on Spiriva   01/25/2013 [facility] follow up  Admitted 12/20-01/22/13 for VDRF , COPD exacerbation , hypoxia, bronchitis d/t human metapenumovirus.  Tx w/ brief vent support., abx, steroids and BD.  Discharged on steroid taper.  PT coming to home.  Has not had flu shot.  Depends on samples , although he has medicaid.  Discussed medication compliance.  Did not pick up prednisone rx  rec Pick up  Prednisone taper as directed, begin today  Restart Dulera  2puffs Twice daily  , pick rx at pharmacy.  Continue on Spiriva 1 puff daily  Flu shot today   02/22/2013 f/u ov/Taylor [name] re: GOLD IV COPD/ still  smoking / no worse since last rx prednisone complete  Chief Complaint  Patient presents with  . Follow-up    Pt states that his breathing is much improved since the last visit. He is ready to quit smoking and asks about starting chantix.   can walk slowly shopping including [employer]/gets let off at door / does not have rescue inhaler rec Work on inhaler technique:    06/17/2013 f/u ov/Taylor [name] re: GOLD IV/ still smoking / supposed to be on spiriva/dulera  Chief Complaint  Patient presents with  . Acute Visit    Pt c/o increased SOB and chest tightness for the past month.  He is using proair approx 4 times per day.  He also c/o prod cough with minimal clear sputum.   confused with meds by name/ most of his cough is am worse in cold weather  rec Prednisone 10 mg take  4 each am x 2 days,   2 each am x 2 days,  1 each am x 2 days and stop  Dulera 200 Take 2 puffs first thing in am and then another 2 puffs about 12 hours later.  Only use your albuterol (proair)as a rescue medication  Pantoprazole (protonix) 40 mg   Take 30-60 min before first meal of the day and Pepcid 20 mg one bedtime until return to office GERD diet    08/09/2013 f/u ov/Taylor [name] re: COPD  III  / still smoking/ on spiriva/dulera 200 Chief Complaint  Patient presents with  . Follow-up    Pt states that his breathing has improved some since last visit. He does state that his breathing is worse when exposed to the humidity. He is using rescue inhaler 4-5 times per day.   never uses saba indoors, only when over does it outdoors, overall better to his satisfaction   rec Work on inhaler technique:   The key is to stop smoking completely before smoking completely stops you!  Restart protonix (pantaprazole) Take 30-60 min before first meal of the day      11/09/2013 f/u ov/Taylor [name] re: copd III/ still smoking much worse p ran out of spiriva / dulera  Chief Complaint  Patient presents with  . Follow-up    Pt states his breathing seems  worse since last visit. He is using rescue inhaler approx 10 times per day. He c/o increased cough x 1 month- prod with moderate clear sputum.   used saba already am of ov "out of habit"  No obvious day to day or daytime variabilty or assoc cp or chest tightness, subjective wheeze overt sinus or hb symptoms. No unusual exp hx or h/o childhood pna/ asthma or knowledge of premature birth.  Sleeping ok without nocturnal  or early am exacerbation  of respiratory  c/o's or need for noct saba. Also denies any obvious fluctuation of symptoms with weather or environmental changes or other aggravating or alleviating factors except as outlined above   Current Medications, Allergies, Complete Past Medical History, Past Surgical History, Family History, and Social History were reviewed in [employer]  electronic medical record.  ROS  The following are not active complaints unless bolded sore throat, dysphagia, dental problems, itching, sneezing,  nasal congestion or excess/ purulent secretions, ear ache,   fever, chills, sweats, unintended wt loss, pleuritic or exertional cp, hemoptysis,  orthopnea pnd or leg swelling, presyncope, palpitations, heartburn, abdominal pain, anorexia, nausea, vomiting, diarrhea  or change in bowel or urinary habits, change in stools or urine, dysuria,hematuria,  rash, arthralgias, visual complaints, headache, numbness weakness or ataxia or problems with walking or coordination,  change in mood/affect or memory.                   Objective:   Physical Exam  Anxious amb wm nad    06/17/2013       178  > 08/09/2013 184 >  11/09/2013  176  Wt Readings from Last 3 Encounters:  02/22/13 186 lb (84.369 kg)  01/25/13 180 lb 9.6 oz (81.92 kg)  01/18/13 156 lb 15.5 oz (71.2 kg)        HEENT mild turbinate edema.  Oropharynx no thrush or excess pnd or cobblestoning.  No JVD or cervical adenopathy. Mild accessory muscle hypertrophy. Trachea midline, nl thryroid. Chest was  hyperinflated by percussion with diminished breath sounds and moderate increased exp time without wheeze. Hoover sign positive at mid inspiration. Regular rate and rhythm without murmur gallop or rub or increase P2 or edema.  Abd: no hsm, nl excursion. Ext warm without cyanosis or clubbing.     cxr 06/02/13  No active disease.     Assessment & Plan:

## 2013-11-09 NOTE — Patient Instructions (Addendum)
Prednisone 10 mg take  4 each am x 2 days,   2 each am x 2 days,  1 each am x 2 days and stop   Protonix Take 30-60 min before first meal of the day and stay on it for now   Only use your albuterol (proair) as a rescue medication to be used if you can't catch your breath by resting or doing a relaxed purse lip breathing pattern.  - The less you use it, the better it will work when you need it. - Ok to use up to 2 puffs  every 4 hours if you must but call for immediate appointment if use goes up over your usual need - Don't leave home without it !!  (think of it like the spare tire for your car)    Please schedule a follow up office visit in 6 weeks, call sooner if needed

## 2013-11-09 NOTE — Assessment & Plan Note (Signed)
-   09/17/2012  Walked RA x 3 laps @ 185 ft each stopped due to  End of study, no desat - Spirometry 09/17/2012 FEV1  0.80 (21%) ratio 52  - PFT's 12/15/2012  FEV1  0.89 (24%) with Ratio 38 p am saba and DLCO 44% corrects to 68%  - PFTs 12/07/13      FEV1   1.18 (32%)  Ratio 40  And no change p saba DLCO 50 corrects to 68% took neither spiriva nor dulera before pfts - Alpha one screen 12/15/2012 >  MM    DDX of  difficult airways management all start with A and  include Adherence, Ace Inhibitors, Acid Reflux, Active Sinus Disease, Alpha 1 Antitripsin deficiency, Anxiety masquerading as Airways dz,  ABPA,  allergy(esp in young), Aspiration (esp in elderly), Adverse effects of DPI,  Active smokers, plus two Bs  = Bronchiectasis and Beta blocker use..and one C= CHF  Adherence is always the initial "prime suspect" and is a multilayered concern that requires a "trust but verify" approach in every patient - starting with knowing how to use medications, especially inhalers, correctly, keeping up with refills and understanding the fundamental difference between maintenance and prns vs those medications only taken for a very short course and then stopped and not refilled.  - needs to understand refilling meds - The proper method of use, as well as anticipated side effects, of a metered-dose inhaler are discussed and demonstrated to the patient. Improved effectiveness after extensive coaching during this visit to a level of approximately  90%   Active smoking > discussed separately   ? Allergy > Prednisone 10 mg take  4 each am x 2 days,   2 each am x 2 days,  1 each am x 2 days and stop   See instructions for specific recommendations which were reviewed directly with the patient who was given a copy with highlighter outlining the key components.

## 2013-11-10 NOTE — Assessment & Plan Note (Signed)
>   3 m  I took an extended  opportunity with this patient to outline the consequences of continued cigarette use  in airway disorders based on all the data we have from the multiple national lung health studies (perfomed over decades at millions of dollars in cost)  indicating that smoking cessation, not choice of inhalers or physicians, is the most important aspect of care.   

## 2013-12-21 ENCOUNTER — Ambulatory Visit: Payer: Medicaid Other | Admitting: Internal Medicine

## 2016-01-18 ENCOUNTER — Other Ambulatory Visit: Payer: Self-pay | Admitting: Nurse Practitioner

## 2016-05-22 ENCOUNTER — Encounter (HOSPITAL_COMMUNITY): Payer: Self-pay | Admitting: Emergency Medicine

## 2016-05-22 ENCOUNTER — Emergency Department (HOSPITAL_COMMUNITY)
Admission: EM | Admit: 2016-05-22 | Discharge: 2016-05-22 | Disposition: A | Discharge status: Home / Self Care | Payer: Self-pay | Attending: Emergency Medicine | Admitting: Emergency Medicine

## 2016-05-22 ENCOUNTER — Emergency Department (HOSPITAL_COMMUNITY): Payer: Self-pay

## 2016-05-22 DIAGNOSIS — Z85038 Personal history of other malignant neoplasm of large intestine: Secondary | ICD-10-CM | POA: Insufficient documentation

## 2016-05-22 DIAGNOSIS — Z87891 Personal history of nicotine dependence: Secondary | ICD-10-CM | POA: Insufficient documentation

## 2016-05-22 DIAGNOSIS — J449 Chronic obstructive pulmonary disease, unspecified: Secondary | ICD-10-CM | POA: Insufficient documentation

## 2016-05-22 DIAGNOSIS — R1013 Epigastric pain: Secondary | ICD-10-CM | POA: Insufficient documentation

## 2016-05-22 LAB — CBC WITH DIFFERENTIAL/PLATELET
Basophils Absolute: 0 10*3/uL (ref 0.0–0.1)
Basophils Relative: 0 %
EOS ABS: 0.2 10*3/uL (ref 0.0–0.7)
EOS PCT: 3 %
HCT: 41.8 % (ref 39.0–52.0)
Hemoglobin: 13.9 g/dL (ref 13.0–17.0)
Lymphocytes Relative: 28 %
Lymphs Abs: 1.9 10*3/uL (ref 0.7–4.0)
MCH: 31.1 pg (ref 26.0–34.0)
MCHC: 33.3 g/dL (ref 30.0–36.0)
MCV: 93.5 fL (ref 78.0–100.0)
MONOS PCT: 9 %
Monocytes Absolute: 0.6 10*3/uL (ref 0.1–1.0)
Neutro Abs: 4.1 10*3/uL (ref 1.7–7.7)
Neutrophils Relative %: 60 %
PLATELETS: 234 10*3/uL (ref 150–400)
RBC: 4.47 MIL/uL (ref 4.22–5.81)
RDW: 13.4 % (ref 11.5–15.5)
WBC: 6.8 10*3/uL (ref 4.0–10.5)

## 2016-05-22 LAB — COMPREHENSIVE METABOLIC PANEL
ALT: 20 U/L (ref 17–63)
AST: 21 U/L (ref 15–41)
Albumin: 4.1 g/dL (ref 3.5–5.0)
Alkaline Phosphatase: 59 U/L (ref 38–126)
Anion gap: 7 (ref 5–15)
BUN: 15 mg/dL (ref 6–20)
CO2: 28 mmol/L (ref 22–32)
CREATININE: 0.86 mg/dL (ref 0.61–1.24)
Calcium: 8.9 mg/dL (ref 8.9–10.3)
Chloride: 106 mmol/L (ref 101–111)
GFR calc non Af Amer: >60 mL/min (ref >60)
Glucose, Bld: 107 mg/dL — ABNORMAL HIGH (ref 65–99)
Potassium: 4.2 mmol/L (ref 3.5–5.1)
SODIUM: 141 mmol/L (ref 135–145)
Total Bilirubin: 0.5 mg/dL (ref 0.3–1.2)
Total Protein: 6.7 g/dL (ref 6.5–8.1)

## 2016-05-22 LAB — PROTIME-INR
INR: 0.97
PROTHROMBIN TIME: 12.9 s (ref 11.4–15.2)

## 2016-05-22 LAB — POC OCCULT BLOOD, ED: Fecal Occult Bld: NEGATIVE

## 2016-05-22 LAB — TYPE AND SCREEN
ABO/RH(D): O NEG
Antibody Screen: NEGATIVE

## 2016-05-22 LAB — I-STAT TROPONIN, ED: TROPONIN I, POC: 0 ng/mL (ref 0.00–0.08)

## 2016-05-22 LAB — LIPASE, BLOOD: Lipase: 34 U/L (ref 11–51)

## 2016-05-22 MED ORDER — IOPAMIDOL (ISOVUE-300) INJECTION 61%
INTRAVENOUS | Status: AC
  Filled 2016-05-22: qty 100

## 2016-05-22 MED ORDER — OMEPRAZOLE 20 MG PO CPDR: 20.0000 mg | DELAYED_RELEASE_CAPSULE | Freq: Every day | ORAL | 0 refills | Status: DC

## 2016-05-22 MED ORDER — IOPAMIDOL (ISOVUE-300) INJECTION 61%
100.0000 mL | Freq: Once | INTRAVENOUS | Status: AC | PRN
  Administered 2016-05-22: 100 mL via INTRAVENOUS

## 2016-05-22 MED ORDER — SODIUM CHLORIDE 0.9 % IV BOLUS (SEPSIS)
500.0000 mL | Freq: Once | INTRAVENOUS | Status: AC
  Administered 2016-05-22: 500 mL via INTRAVENOUS

## 2016-05-22 MED ORDER — SUCRALFATE 1 GM/10ML PO SUSP: 1.0000 g | Freq: Three times a day (TID) | ORAL | 0 refills | Status: DC

## 2016-05-22 NOTE — Discharge Instructions (Signed)
We believe your symptoms are a result of GERD (acid reflux).  Please read through the included information and follow up with your regular doctor.  In the meantime, we encourage you to try an over-the-counter medication such as Prilosec OTC.  Give it at least a week at see if your symptoms improve. ° °Return to the Emergency Department with new or worsening symptoms that concern you. ° ° °Heartburn °Heartburn is a type of pain or discomfort that can happen in the throat or chest. It is often described as a burning pain. It may also cause a bad taste in the mouth. Heartburn may feel worse when you lie down or bend over, and it is often worse at night. Heartburn may be caused by stomach contents that move back up into the esophagus (reflux). °HOME CARE INSTRUCTIONS °Take these actions to decrease your discomfort and to help avoid complications. °Diet °Follow a diet as recommended by your health care provider. This may involve avoiding foods and drinks such as: °Coffee and tea (with or without caffeine). °Drinks that contain alcohol. °Energy drinks and sports drinks. °Carbonated drinks or sodas. °Chocolate and cocoa. °Peppermint and mint flavorings. °Garlic and onions. °Horseradish. °Spicy and acidic foods, including peppers, chili powder, curry powder, vinegar, hot sauces, and barbecue sauce. °Citrus fruit juices and citrus fruits, such as oranges, lemons, and limes. °Tomato-based foods, such as red sauce, chili, salsa, and pizza with red sauce. °Fried and fatty foods, such as donuts, french fries, potato chips, and high-fat dressings. °High-fat meats, such as hot dogs and fatty cuts of red and white meats, such as rib eye steak, sausage, ham, and bacon. °High-fat dairy items, such as whole milk, butter, and cream cheese. °Eat small, frequent meals instead of large meals. °Avoid drinking large amounts of liquid with your meals. °Avoid eating meals during the 2-3 hours before bedtime. °Avoid lying down right after you  eat. °Do not exercise right after you eat. ° General Instructions  °Pay attention to any changes in your symptoms. °Take over-the-counter and prescription medicines only as told by your health care provider. Do not take aspirin, ibuprofen, or other NSAIDs unless your health care provider told you to do so. °Do not use any tobacco products, including cigarettes, chewing tobacco, and e-cigarettes. If you need help quitting, ask your health care provider. °Wear loose-fitting clothing. Do not wear anything tight around your waist that causes pressure on your abdomen. °Raise (elevate) the head of your bed about 6 inches (15 cm). °Try to reduce your stress, such as with yoga or meditation. If you need help reducing stress, ask your health care provider. °If you are overweight, reduce your weight to an amount that is healthy for you. Ask your health care provider for guidance about a safe weight loss goal. °Keep all follow-up visits as told by your health care provider. This is important. °SEEK MEDICAL CARE IF: °You have new symptoms. °You have unexplained weight loss. °You have difficulty swallowing, or it hurts to swallow. °You have wheezing or a persistent cough. °Your symptoms do not improve with treatment. °You have frequent heartburn for more than two weeks. °SEEK IMMEDIATE MEDICAL CARE IF: °You have pain in your arms, neck, jaw, teeth, or back. °You feel sweaty, dizzy, or light-headed. °You have chest pain or shortness of breath. °You vomit and your vomit looks like blood or coffee grounds. °Your stool is bloody or black. °  °This information is not intended to replace advice given to you by your health   care provider. Make sure you discuss any questions you have with your health care provider. °  °Document Released: 05/26/2008 Document Revised: 09/28/2014 Document Reviewed: 05/04/2014 °Elsevier Interactive Patient Education ©2016 [employer] ° °Food Choices for Gastroesophageal Reflux Disease, Adult °When you have  gastroesophageal reflux disease (GERD), the foods you eat and your eating habits are very important. Choosing the right foods can help ease the discomfort of GERD. °WHAT GENERAL GUIDELINES DO I NEED TO FOLLOW? °Choose fruits, vegetables, whole grains, low-fat dairy products, and low-fat meat, fish, and poultry. °Limit fats such as oils, salad dressings, butter, nuts, and avocado. °Keep a food diary to identify foods that cause symptoms. °Avoid foods that cause reflux. These may be different for different people. °Eat frequent small meals instead of three large meals each day. °Eat your meals slowly, in a relaxed setting. °Limit fried foods. °Cook foods using methods other than frying. °Avoid drinking alcohol. °Avoid drinking large amounts of liquids with your meals. °Avoid bending over or lying down until 2-3 hours after eating. °WHAT FOODS ARE NOT RECOMMENDED? °The following are some foods and drinks that may worsen your symptoms: °Vegetables °Tomatoes. Tomato juice. Tomato and spaghetti sauce. Chili peppers. Onion and garlic. Horseradish. °Fruits °Oranges, grapefruit, and lemon (fruit and juice). °Meats °High-fat meats, fish, and poultry. This includes hot dogs, ribs, ham, sausage, salami, and bacon. °Dairy °Whole milk and chocolate milk. Sour cream. Cream. Butter. Ice cream. Cream cheese.  °Beverages °Coffee and tea, with or without caffeine. Carbonated beverages or energy drinks. °Condiments °Hot sauce. Barbecue sauce.  °Sweets/Desserts °Chocolate and cocoa. Donuts. Peppermint and spearmint. °Fats and Oils °High-fat foods, including French fries and potato chips. °Other °Vinegar. Strong spices, such as black pepper, white pepper, red pepper, cayenne, curry powder, cloves, ginger, and chili powder. °The items listed above may not be a complete list of foods and beverages to avoid. Contact your dietitian for more information. °  °This information is not intended to replace advice given to you by your health care  provider. Make sure you discuss any questions you have with your health care provider. °  °Document Released: 01/07/2005 Document Revised: 01/28/2014 Document Reviewed: 11/11/2012 °Elsevier Interactive Patient Education ©2016 [employer] Inc. ° ° °

## 2016-05-22 NOTE — ED Provider Notes (Signed)
Emergency Department Provider Note   I have reviewed the triage vital signs and the nursing notes.   HISTORY  Chief Complaint Abdominal Pain   HPI Taylor [name] is a 61 y.o. male with PMH of Bipolar I, COPD, and history of colon cancer with recent colonoscopy presents to the emergency department for evaluation of dark, coffee-ground stool. Patient states that over the past week he's been short of breath and using his inhaler more than normal. Symptoms are worse with exertion. No chest pain. Patient states that he is a bowel movement for the past 2 days and took some magnesium citrate and proceeded to have several bowel movements consisting of "coffee ground" material. No gross blood. No hematemesis. No fevers or chills. The patient states that he had a colonoscopy after being released from prison 2 weeks ago. They found several polyps but no other major issues.   Past Medical History:  Diagnosis Date  . Bipolar 1 disorder ([place]) 02/05/2012  . Cancer ([place]) 04/11/11   adenocarcinoma of colon, 7/19 nodes pos.FINISHED CHEMO/DR. SHERRILL  . Colon cancer ([place]) 04/11/2011  . COPD (chronic obstructive pulmonary disease) ([place])    SMOKER  . Depression    Bipolar disorder  . Full dentures   . Hemorrhoids   . Inguinal hernia    RIGHT- PAINFUL  . Rib fractures    hx of  . Shortness of breath     Patient Active Problem List   Diagnosis Date Noted  . Acute bronchitis due to human metapneumovirus 01/16/2013  . Alcohol abuse 01/09/2013  . COPD exacerbation ([place]) 01/09/2013  . Chest pain 01/09/2013  . Acute respiratory failure with hypoxia ([place]) 01/09/2013  . Smoker 12/16/2012  . Inguinal hernia unilateral, non-recurrent, right 05/01/2012  . Dyspepsia 02/05/2012  . Bipolar 1 disorder ([place]) 02/05/2012  . Depression 02/05/2012  . Hyperglycemia 02/04/2012  . COPD  GOLD III 02/03/2012  . Thrombocytopenia ([place]) 02/03/2012  . 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 COLON SURGERY  04/11/11   Sigmoid colectomy  . COLOSTOMY REVISION  04/11/2011   Procedure: COLON RESECTION SIGMOID;  Surgeon: [name], MD;  Location: [facility];  Service: General;  Laterality: N/A;  low anterior colon resection   . INGUINAL HERNIA REPAIR Right 05/01/2012   Procedure: HERNIA REPAIR INGUINAL ADULT;  Surgeon: [name], MD;  Location: [facility];  Service: General;  Laterality: Right;  . INSERTION OF MESH Right 05/01/2012   Procedure: INSERTION OF MESH;  Surgeon: [name], MD;  Location: [facility];  Service: General;  Laterality: Right;  . PORT-A-CATH REMOVAL Left 05/01/2012   Procedure: REMOVAL Infusion Port;  Surgeon: [name], MD;  Location: [facility];  Service: General;  Laterality: Left;  . PORTACATH PLACEMENT    . PORTACATH PLACEMENT  05/02/2011   Procedure: INSERTION PORT-A-CATH;  Surgeon: [name], MD;  Location: [facility];  Service: General;  Laterality: N/A;    Current Outpatient Rx  . Order #: [id number] Class: Historical Med  . Order #: [id number] Class: Print  . Order #: [id number] Class: Historical Med  . Order #: [id number] Class: Historical Med  . Order #: [id number] Class: Historical Med  . Order #: [id number] Class: Historical Med  . Order #: [id number] Class: Historical Med  . Order #: [id number] Class: Print  . Order #: [id number] Class: Print  . Order #: [id number] Class: Print  . Order #: [id number] Class: Print    Allergies Codeine  Family History  Problem  Relation Age of Onset  . Heart disease Father   . Lung cancer Maternal Uncle     smoked    Social History Social History  Substance Use Topics  . Smoking status: Former Smoker    Packs/day: 0.50    Years: 38.00    Types: Cigarettes  . Smokeless tobacco: Former Systems developer     Comment: 05/22/2016-pt Quit 30 months ago.   . Alcohol use No     Comment: occasional weekends, 1 six pack or 2 six packs per weekend. Last drink  30 months ago.     Review of  Systems  Constitutional: No fever/chills Eyes: No visual changes. ENT: No sore throat. Cardiovascular: Denies chest pain. Respiratory: Denies shortness of breath. Gastrointestinal: Positive epigastric abdominal pain. Positive nausea, no vomiting. Positive diarrhea with "coffee ground" material.  No constipation. Genitourinary: Negative for dysuria. Musculoskeletal: Negative for back pain. Skin: Negative for rash. Neurological: Negative for headaches, focal weakness or numbness.  10-point ROS otherwise negative.  ____________________________________________   PHYSICAL EXAM:  VITAL SIGNS: ED Triage Vitals  Enc Vitals Group     BP 05/22/16 1638 (!) 175/96     Pulse Rate 05/22/16 1638 76     Resp 05/22/16 1638 16     Temp 05/22/16 1638 98.3 F (36.8 C)     Temp Source 05/22/16 1638 Oral     SpO2 05/22/16 1627 96 %     Weight 05/22/16 1637 185 lb (83.9 kg)     Height 05/22/16 1637 5\' 11"  (1.803 m)     Pain Score 05/22/16 1637 5   Constitutional: Alert and oriented. Well appearing and in no acute distress. Eyes: Conjunctivae are normal.  Head: Atraumatic. Nose: No congestion/rhinnorhea. Mouth/Throat: Mucous membranes are moist.  Oropharynx non-erythematous. Neck: No stridor.   Cardiovascular: Normal rate, regular rhythm. Good peripheral circulation. Grossly normal heart sounds.   Respiratory: Normal respiratory effort.  No retractions. Lungs CTAB. Gastrointestinal: Soft with mild epigastric tenderness. No distention. Brown, hemoccult negative stool with no melena or BRB.  Musculoskeletal: No lower extremity tenderness nor edema. No gross deformities of extremities. Neurologic:  Normal speech and language. No gross focal neurologic deficits are appreciated.  Skin:  Skin is warm, dry and intact. No rash noted.   ____________________________________________   LABS (all labs ordered are listed, but only abnormal results are displayed)  Labs Reviewed  COMPREHENSIVE  METABOLIC PANEL - Abnormal; Notable for the following:       Result Value   Glucose, Bld 107 (*)    All other components within normal limits  LIPASE, BLOOD  CBC WITH DIFFERENTIAL/PLATELET  PROTIME-INR  POC OCCULT BLOOD, ED  I-STAT TROPOININ, ED  TYPE AND SCREEN   ____________________________________________  EKG   EKG Interpretation  Date/Time:  Wednesday May 22 2016 16:52:18 EDT Ventricular Rate:  74 PR Interval:    QRS Duration: 75 QT Interval:  379 QTC Calculation: 421 R Axis:   70 Text Interpretation:  Sinus rhythm Anteroseptal infarct, age indeterminate Minimal ST elevation, inferior leads Confirmed by Ripley Fraise [phone]) on 05/23/2016 9:41:15 AM       ____________________________________________  RADIOLOGY  Dg Chest 2 View  Result Date: 05/22/2016 CLINICAL DATA:  Shortness of Breath EXAM: CHEST  2 VIEW COMPARISON:  06/02/2013 FINDINGS: The heart size and mediastinal contours are within normal limits. Both lungs are clear. The visualized skeletal structures show old healed rib fractures on the right. IMPRESSION: No active cardiopulmonary disease. Electronically Signed   By: Linus Mako.D.  On: 05/22/2016 17:59   Ct Abdomen Pelvis W Contrast  Result Date: 05/22/2016 CLINICAL DATA:  Per EMS Pt c/o ABD x1 day. Pt reports taking a laxative yesterday and having a bowel movement described as coffee grounds. Pt Denies N/V/D, and chest pain. No bowel movement today. Hx of COPD, colon and prostate cancer. Pt AOx4. EXAM: CT ABDOMEN AND PELVIS WITH CONTRAST TECHNIQUE: Multidetector CT imaging of the abdomen and pelvis was performed using the standard protocol following bolus administration of intravenous contrast. CONTRAST:  159mL ISOVUE-300 IOPAMIDOL (ISOVUE-300) INJECTION 61% COMPARISON:  04/15/2012 FINDINGS: Lower chest: No acute abnormality. Hepatobiliary: No focal liver abnormality is seen. No gallstones, gallbladder wall thickening, or biliary dilatation. Pancreas:  Unremarkable. No pancreatic ductal dilatation or surrounding inflammatory changes. Spleen: Normal in size without focal abnormality. Adrenals/Urinary Tract: Subcentimeter low-density lesion arises from the lateral cortex of the lower pole the left kidney consistent with a cyst, increased in size the prior CT. No other renal masses or lesions. No stones. No hydronephrosis. Normal ureters. Normal bladder. Stomach/Bowel: Stomach and small bowel are unremarkable. There is no colonic wall thickening, dilation or adjacent inflammation. There are multiple left colon diverticula. No diverticulitis. Mild increased stool is noted throughout the colon. Normal appendix visualized. Vascular/Lymphatic: Aortic atherosclerosis. Infrarenal abdominal aortic aneurysm dilates to 2.5 cm. No adenopathy. Reproductive: Prostate is unremarkable. Other: No abdominal wall hernia.  No ascites. Musculoskeletal: No fracture or acute finding. No osteoblastic or osteolytic lesions. IMPRESSION: 1. No acute findings within the abdomen or pelvis. 2. Multiple left colon diverticula.  No diverticulitis. 3. Mild increase in stool in the rectum and colon. 4. Aortic atherosclerosis. Infrarenal abdominal aorta dilated to 2.5 cm. Ectatic abdominal aorta at risk for aneurysm development. Recommend followup by ultrasound in 5 years. This recommendation follows ACR consensus guidelines: White Paper of the ACR Incidental Findings Committee II on Vascular Findings. J Am Coll Radiol 2013; 10:789-794. 5. Small low-density left renal lesion consistent with cyst. Electronically Signed   By: Lajean Manes M.D.   On: 05/22/2016 18:46    ____________________________________________   PROCEDURES  Procedure(s) performed:   Procedures  None ____________________________________________   INITIAL IMPRESSION / ASSESSMENT AND PLAN / ED COURSE  Pertinent labs & imaging results that were available during my care of the patient were reviewed by me and  considered in my medical decision making (see chart for details).  Patient presents to the emergency department for evaluation of abdominal discomfort and report of coffee-ground material in his stool yesterday. Patient has tenderness to the epigastrium diffusely with no rebound or guarding. Rectal exam shows dark stool but no melena or gross blood. No hemorrhoids. No palpable masses on exam. Patient was incarcerated over the past 2 years. He had a recent colonoscopy after being released 2 weeks ago which reportedly showed several polyps that were removed. Patient has no peritoneal signs of his abdomen.  Patient's labs are unremarkable. Normal rectal exam with heme negative stool. No acute findings on CT. Patient to f/u with PCP regarding aortic findings but no evidence to suggest that this is causing symptoms at this time.   At this time, I do not feel there is any life-threatening condition present. I have reviewed and discussed all results (EKG, imaging, lab, urine as appropriate), exam findings with patient. I have reviewed nursing notes and appropriate previous records.  I feel the patient is safe to be discharged home without further emergent workup. Discussed usual and customary return precautions. Patient and family (if present) verbalize understanding  and are comfortable with this plan.  Patient will follow-up with their primary care provider. If they do not have a primary care provider, information for follow-up has been provided to them. All questions have been answered.  ____________________________________________  FINAL CLINICAL IMPRESSION(S) / ED DIAGNOSES  Final diagnoses:  Epigastric pain     MEDICATIONS GIVEN DURING THIS VISIT:  Medications  sodium chloride 0.9 % bolus 500 mL (0 mLs Intravenous Stopped 05/22/16 1852)  iopamidol (ISOVUE-300) 61 % injection 100 mL (100 mLs Intravenous Contrast Given 05/22/16 1827)     NEW OUTPATIENT MEDICATIONS STARTED DURING THIS  VISIT:  Discharge Medication List as of 05/22/2016  7:17 PM    START taking these medications   Details  omeprazole (PRILOSEC) 20 MG capsule Take 1 capsule (20 mg total) by mouth daily., Starting Wed 05/22/2016, Until Fri 06/21/2016, Print    sucralfate (CARAFATE) 1 GM/10ML suspension Take 10 mLs (1 g total) by mouth 4 (four) times daily -  with meals and at bedtime., Starting Wed 05/22/2016, Print        Note:  This document was prepared using Dragon voice recognition software and may include unintentional dictation errors.  Nanda Quinton, MD Emergency Medicine   Margette Fast, MD 05/23/16 [phone]

## 2016-05-22 NOTE — ED Notes (Signed)
Bed: BK47 Expected date:  Expected time:  Means of arrival:  Comments: EMS/abd. pain

## 2016-05-22 NOTE — ED Triage Notes (Signed)
Per EMS Pt c/o ABD x1 day. Pt reports taking a laxative yesterday and having a bowel movement described as coffee grounds. Pt Denies N/V/D, and chest pain. No bowel movement today. Hx of COPD, colon and prostate cancer. Pt AOx4.

## 2016-05-25 ENCOUNTER — Encounter (HOSPITAL_COMMUNITY): Payer: Self-pay | Admitting: Emergency Medicine

## 2016-05-25 ENCOUNTER — Emergency Department (HOSPITAL_COMMUNITY): Payer: Medicaid Other

## 2016-05-25 ENCOUNTER — Emergency Department (HOSPITAL_COMMUNITY)
Admission: EM | Admit: 2016-05-25 | Discharge: 2016-05-25 | Disposition: A | Discharge status: Home / Self Care | Payer: Medicaid Other | Attending: Emergency Medicine | Admitting: Emergency Medicine

## 2016-05-25 DIAGNOSIS — Z87891 Personal history of nicotine dependence: Secondary | ICD-10-CM | POA: Insufficient documentation

## 2016-05-25 DIAGNOSIS — Z85038 Personal history of other malignant neoplasm of large intestine: Secondary | ICD-10-CM | POA: Insufficient documentation

## 2016-05-25 DIAGNOSIS — J441 Chronic obstructive pulmonary disease with (acute) exacerbation: Secondary | ICD-10-CM | POA: Insufficient documentation

## 2016-05-25 DIAGNOSIS — Z79899 Other long term (current) drug therapy: Secondary | ICD-10-CM | POA: Insufficient documentation

## 2016-05-25 LAB — BASIC METABOLIC PANEL
ANION GAP: 7 (ref 5–15)
BUN: 15 mg/dL (ref 6–20)
CHLORIDE: 104 mmol/L (ref 101–111)
CO2: 26 mmol/L (ref 22–32)
CREATININE: 0.94 mg/dL (ref 0.61–1.24)
Calcium: 8.5 mg/dL — ABNORMAL LOW (ref 8.9–10.3)
GFR calc non Af Amer: >60 mL/min (ref >60)
GLUCOSE: 135 mg/dL — AB (ref 65–99)
Potassium: 3.7 mmol/L (ref 3.5–5.1)
Sodium: 137 mmol/L (ref 135–145)

## 2016-05-25 LAB — CBC WITH DIFFERENTIAL/PLATELET
BASOS PCT: 0 %
Basophils Absolute: 0 10*3/uL (ref 0.0–0.1)
Eosinophils Absolute: 0.2 10*3/uL (ref 0.0–0.7)
Eosinophils Relative: 3 %
HEMATOCRIT: 44.8 % (ref 39.0–52.0)
HEMOGLOBIN: 14.7 g/dL (ref 13.0–17.0)
LYMPHS PCT: 24 %
Lymphs Abs: 1.8 10*3/uL (ref 0.7–4.0)
MCH: 31.1 pg (ref 26.0–34.0)
MCHC: 32.8 g/dL (ref 30.0–36.0)
MCV: 94.7 fL (ref 78.0–100.0)
MONO ABS: 0.7 10*3/uL (ref 0.1–1.0)
MONOS PCT: 9 %
NEUTROS ABS: 4.7 10*3/uL (ref 1.7–7.7)
NEUTROS PCT: 64 %
Platelets: 257 10*3/uL (ref 150–400)
RBC: 4.73 MIL/uL (ref 4.22–5.81)
RDW: 13.2 % (ref 11.5–15.5)
WBC: 7.4 10*3/uL (ref 4.0–10.5)

## 2016-05-25 LAB — I-STAT TROPONIN, ED: Troponin i, poc: 0 ng/mL (ref 0.00–0.08)

## 2016-05-25 MED ORDER — LORAZEPAM 2 MG/ML IJ SOLN
2.0000 mg | Freq: Once | INTRAMUSCULAR | Status: AC
  Administered 2016-05-25: 2 mg via INTRAVENOUS

## 2016-05-25 MED ORDER — ALBUTEROL SULFATE HFA 108 (90 BASE) MCG/ACT IN AERS: 2.0000 | INHALATION_SPRAY | Freq: Four times a day (QID) | RESPIRATORY_TRACT | Status: DC

## 2016-05-25 MED ORDER — LORAZEPAM 2 MG/ML IJ SOLN: 2.0000 mg | INTRAMUSCULAR | Status: DC | PRN

## 2016-05-25 MED ORDER — ALBUTEROL (5 MG/ML) CONTINUOUS INHALATION SOLN
10.0000 mg/h | INHALATION_SOLUTION | Freq: Once | RESPIRATORY_TRACT | Status: AC
  Administered 2016-05-25: 10 mg/h via RESPIRATORY_TRACT
  Filled 2016-05-25: qty 20

## 2016-05-25 MED ORDER — PREDNISONE 20 MG PO TABS: 20.0000 mg | ORAL_TABLET | Freq: Two times a day (BID) | ORAL | 0 refills | Status: DC

## 2016-05-25 MED ORDER — IPRATROPIUM BROMIDE HFA 17 MCG/ACT IN AERS: 2.0000 | INHALATION_SPRAY | Freq: Two times a day (BID) | RESPIRATORY_TRACT | 12 refills | Status: DC

## 2016-05-25 MED ORDER — METHYLPREDNISOLONE SODIUM SUCC 125 MG IJ SOLR
125.0000 mg | Freq: Once | INTRAMUSCULAR | Status: AC
  Administered 2016-05-25: 125 mg via INTRAVENOUS
  Filled 2016-05-25: qty 2

## 2016-05-25 MED ORDER — ALBUTEROL SULFATE HFA 108 (90 BASE) MCG/ACT IN AERS: 1.0000 | INHALATION_SPRAY | Freq: Four times a day (QID) | RESPIRATORY_TRACT | 0 refills | Status: DC | PRN

## 2016-05-25 MED ORDER — LORAZEPAM 2 MG/ML IJ SOLN
INTRAMUSCULAR | Status: AC
  Filled 2016-05-25: qty 1

## 2016-05-25 MED ORDER — DOXYCYCLINE HYCLATE 100 MG PO CAPS: 100.0000 mg | ORAL_CAPSULE | Freq: Two times a day (BID) | ORAL | 0 refills | Status: DC

## 2016-05-25 NOTE — ED Notes (Signed)
Patient transported to X-ray 

## 2016-05-25 NOTE — ED Notes (Signed)
Patient ambulatory without difficulty. Maintained O2 saturation of 93%.

## 2016-05-25 NOTE — Discharge Instructions (Signed)
Stop smoking. Albuterol, Atrovent, doxycycline, and prednisone prescriptions

## 2016-05-25 NOTE — ED Provider Notes (Signed)
[facility] DEPT Provider Note   CSN: [id number] Arrival date & time: 05/25/16  2018     History   Chief Complaint Chief Complaint  Patient presents with  . Shortness of Breath    HPI Taylor [name] is a 61 y.o. male.HPI: 61 year old male. Is been incarcerated for 2 and half years. His been out for about 1 month. He did not smoke all he was incarcerated. Started daily smoking a pack per day upon discharge. Is been more short of breath for the last month. Was told before his incarceration that he had COPD. Is not back on any medications. Seen and evaluated several days ago with abdominal pain is thought to be gastritis, or ulcer disease.  Chest pain. History of breath with exertion. Feels tightness in his chest but no pain. He coughs frequently. No PND or orthopnea. No leg swelling. No history DVT or PE. No alcohol use.  Past Medical History:  Diagnosis Date  . Bipolar 1 disorder ([place]) 02/05/2012  . Cancer ([place]) 04/11/11   adenocarcinoma of colon, 7/19 nodes pos.FINISHED CHEMO/DR. SHERRILL  . Colon cancer ([place]) 04/11/2011  . COPD (chronic obstructive pulmonary disease) ([place])    SMOKER  . Depression    Bipolar disorder  . Full dentures   . Hemorrhoids   . Inguinal hernia    RIGHT- PAINFUL  . Rib fractures    hx of  . Shortness of breath     Patient Active Problem List   Diagnosis Date Noted  . Acute bronchitis due to human metapneumovirus 01/16/2013  . Alcohol abuse 01/09/2013  . COPD exacerbation ([place]) 01/09/2013  . Chest pain 01/09/2013  . Acute respiratory failure with hypoxia ([place]) 01/09/2013  . Smoker 12/16/2012  . Inguinal hernia unilateral, non-recurrent, right 05/01/2012  . Dyspepsia 02/05/2012  . Bipolar 1 disorder ([place]) 02/05/2012  . Depression 02/05/2012  . Hyperglycemia 02/04/2012  . COPD  GOLD III 02/03/2012  . Thrombocytopenia ([place]) 02/03/2012  . 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 COLON  SURGERY  04/11/11   Sigmoid colectomy  . COLOSTOMY REVISION  04/11/2011   Procedure: COLON RESECTION SIGMOID;  Surgeon: [name], MD;  Location: [facility];  Service: General;  Laterality: N/A;  low anterior colon resection   . INGUINAL HERNIA REPAIR Right 05/01/2012   Procedure: HERNIA REPAIR INGUINAL ADULT;  Surgeon: [name], MD;  Location: [facility];  Service: General;  Laterality: Right;  . INSERTION OF MESH Right 05/01/2012   Procedure: INSERTION OF MESH;  Surgeon: [name], MD;  Location: [facility];  Service: General;  Laterality: Right;  . PORT-A-CATH REMOVAL Left 05/01/2012   Procedure: REMOVAL Infusion Port;  Surgeon: [name], MD;  Location: [facility];  Service: General;  Laterality: Left;  . PORTACATH PLACEMENT    . PORTACATH PLACEMENT  05/02/2011   Procedure: INSERTION PORT-A-CATH;  Surgeon: [name], MD;  Location: [facility];  Service: General;  Laterality: N/A;       Home Medications    Prior to Admission medications   Medication Sig Start Date End Date Taking? Authorizing Provider  ARIPiprazole (ABILIFY) 2 MG tablet Take 2 mg by mouth every morning.    Yes [provider]  bismuth subsalicylate (PEPTO BISMOL) 262 MG/15ML suspension Take 30 mLs by mouth every 6 (six) hours as needed for indigestion or diarrhea or loose stools.    Yes [provider]  escitalopram (LEXAPRO) 20 MG tablet  Take 20 mg by mouth every morning.    Yes [provider]  lovastatin (MEVACOR) 20 MG tablet Take 20 mg by mouth at bedtime.   Yes [provider]  mometasone-formoterol (DULERA) 200-5 MCG/ACT AERO INHALE 2 PUFFS INTO THE LUNGS FIRST THING IN THE MORNING THEN ANOTHER 2 PUFFS 12 HOURS LATER. 08/09/13  Yes [name], MD  omeprazole (PRILOSEC) 20 MG capsule Take 1 capsule (20 mg total) by mouth daily. Patient taking differently: Take 20 mg by mouth every morning.  05/22/16 06/21/16 Yes Long, Wonda Olds, MD  sucralfate (CARAFATE) 1 GM/10ML suspension Take 10  mLs (1 g total) by mouth 4 (four) times daily -  with meals and at bedtime. 05/22/16  Yes Long, Wonda Olds, MD  tiotropium (SPIRIVA HANDIHALER) 18 MCG inhalation capsule One capsule each am on wakening 08/09/13  Yes [name], MD  albuterol (PROVENTIL HFA;VENTOLIN HFA) 108 (90 Base) MCG/ACT inhaler Inhale 1-2 puffs into the lungs every 6 (six) hours as needed for wheezing. 05/25/16   Tanna Furry, MD  doxycycline (VIBRAMYCIN) 100 MG capsule Take 1 capsule (100 mg total) by mouth 2 (two) times daily. 05/25/16   Tanna Furry, MD  ipratropium (ATROVENT HFA) 17 MCG/ACT inhaler Inhale 2 puffs into the lungs 2 (two) times daily. 05/25/16   Tanna Furry, MD  predniSONE (DELTASONE) 20 MG tablet Take 1 tablet (20 mg total) by mouth 2 (two) times daily with a meal. 05/25/16   Tanna Furry, MD    Family History Family History  Problem Relation Age of Onset  . Heart disease Father   . Lung cancer Maternal Uncle     smoked    Social History Social History  Substance Use Topics  . Smoking status: Former Smoker    Packs/day: 0.50    Years: 38.00    Types: Cigarettes  . Smokeless tobacco: Former Systems developer     Comment: 05/22/2016-pt Quit 30 months ago.   . Alcohol use No     Comment: occasional weekends, 1 six pack or 2 six packs per weekend. Last drink  30 months ago.      Allergies   Codeine   Review of Systems Review of Systems  Constitutional: Negative for appetite change, chills, diaphoresis, fatigue and fever.  HENT: Negative for mouth sores, sore throat and trouble swallowing.   Eyes: Negative for visual disturbance.  Respiratory: Positive for shortness of breath and wheezing. Negative for cough and chest tightness.   Cardiovascular: Negative for chest pain.  Gastrointestinal: Negative for abdominal distention, abdominal pain, diarrhea, nausea and vomiting.  Endocrine: Negative for polydipsia, polyphagia and polyuria.  Genitourinary: Negative for dysuria, frequency and hematuria.  Musculoskeletal:  Negative for gait problem.  Skin: Negative for color change, pallor and rash.  Neurological: Negative for dizziness, syncope, light-headedness and headaches.  Hematological: Does not bruise/bleed easily.  Psychiatric/Behavioral: Negative for behavioral problems and confusion.     Physical Exam Updated Vital Signs BP 130/89 (BP Location: Right Arm)   Pulse (!) 109   Temp 98.7 F (37.1 C)   Resp (!) 37   SpO2 91%   Physical Exam  Constitutional: He is oriented to person, place, and time. He appears well-developed and well-nourished. No distress.  HENT:  Head: Normocephalic.  Eyes: Conjunctivae are normal. Pupils are equal, round, and reactive to light. No scleral icterus.  Neck: Normal range of motion. Neck supple. No thyromegaly present.  Cardiovascular: Normal rate and regular rhythm.  Exam reveals no gallop and no friction  rub.   No murmur heard. Pulmonary/Chest: Effort normal. No respiratory distress. He has wheezes. He has no rales.  Globally diminished breath sounds. Prolongation with cough. End expiratory wheezing.  Abdominal: Soft. Bowel sounds are normal. He exhibits no distension. There is no tenderness. There is no rebound.  Musculoskeletal: Normal range of motion.  Neurological: He is alert and oriented to person, place, and time.  Skin: Skin is warm and dry. No rash noted.  Psychiatric: He has a normal mood and affect. His behavior is normal.     ED Treatments / Results  Labs (all labs ordered are listed, but only abnormal results are displayed) Labs Reviewed  BASIC METABOLIC PANEL - Abnormal; Notable for the following:       Result Value   Glucose, Bld 135 (*)    Calcium 8.5 (*)    All other components within normal limits  CBC WITH DIFFERENTIAL/PLATELET  Randolm Idol, ED    EKG  EKG Interpretation None       Radiology Dg Chest 2 View  Result Date: 05/25/2016 CLINICAL DATA:  Acute onset of shortness of breath with exertion. Generalized chest  pressure. Initial encounter. EXAM: CHEST  2 VIEW COMPARISON:  Chest radiograph performed 05/22/2016 FINDINGS: The lungs are well-aerated and clear. There is no evidence of focal opacification, pleural effusion or pneumothorax. The heart is normal in size; the mediastinal contour is within normal limits. No acute osseous abnormalities are seen. Chronic right-sided rib deformities are noted. IMPRESSION: No acute cardiopulmonary process seen. Electronically Signed   By: Garald Balding M.D.   On: 05/25/2016 20:44    Procedures Procedures (including critical care time)  Medications Ordered in ED Medications  albuterol (PROVENTIL,VENTOLIN) solution continuous neb (10 mg/hr Nebulization Given 05/25/16 2053)  methylPREDNISolone sodium succinate (SOLU-MEDROL) 125 mg/2 mL injection 125 mg (125 mg Intravenous Given 05/25/16 2045)  LORazepam (ATIVAN) injection 2 mg (2 mg Intravenous Given 05/25/16 2136)     Initial Impression / Assessment and Plan / ED Course  I have reviewed the triage vital signs and the nursing notes.  Pertinent labs & imaging results that were available during my care of the patient were reviewed by me and considered in my medical decision making (see chart for details).    Plan advised albuterol IV site Medrol chest x-ray lab evaluation reevaluation. Symptoms are all consistent with probable bronchospasm and COPD exacerbation. With recent diagnosis of ulcer will recheck hemoglobin to ensure not dropping. Plan reassessment after the above.  21:27:  Patient reevaluated. He is quite anxious while receiving albuterol neb. He has improving aeration in his lungs. We'll plan some IV Ativan and continuing his nebulizer. We'll reevaluate.  Final Clinical Impressions(s) / ED Diagnoses   Final diagnoses:  COPD exacerbation ([place])    1610.:  Patient feeling much improved. Resting comfortably. Not somnolent. Ambulated in the hallway by myself for 100 feet with pulse ox did not desaturate below  93% or becomes symptomatic. His lungs remained diminished but improved aeration and no wheezing. Appropriate for discharge home. Plan Atrovent, albuterol, Dr. 7 days, prednisone 5.  New Prescriptions New Prescriptions   ALBUTEROL (PROVENTIL HFA;VENTOLIN HFA) 108 (90 BASE) MCG/ACT INHALER    Inhale 1-2 puffs into the lungs every 6 (six) hours as needed for wheezing.   DOXYCYCLINE (VIBRAMYCIN) 100 MG CAPSULE    Take 1 capsule (100 mg total) by mouth 2 (two) times daily.   IPRATROPIUM (ATROVENT HFA) 17 MCG/ACT INHALER    Inhale 2 puffs into the lungs  2 (two) times daily.   PREDNISONE (DELTASONE) 20 MG TABLET    Take 1 tablet (20 mg total) by mouth 2 (two) times daily with a meal.     Tanna Furry, MD 05/25/16 [phone]

## 2016-05-25 NOTE — ED Notes (Signed)
Bed: WA01 Expected date:  Expected time:  Means of arrival:  Comments: EMS shob

## 2016-05-25 NOTE — ED Triage Notes (Signed)
Per EMS, patient from home, c/o SOB with exertion x a few days. Hx prostate and colon cancer. Denies chest pain, abdominal pain, N/V/D. Ambulatory with EMS. 18g L AC.

## 2016-05-25 NOTE — ED Notes (Signed)
ED Provider at bedside. 

## 2016-05-25 NOTE — ED Notes (Signed)
Respiratory called for breathing treatment.

## 2016-06-05 ENCOUNTER — Telehealth: Payer: Self-pay | Admitting: Internal Medicine

## 2016-06-05 ENCOUNTER — Encounter: Payer: Self-pay | Admitting: Internal Medicine

## 2016-06-05 ENCOUNTER — Ambulatory Visit (INDEPENDENT_AMBULATORY_CARE_PROVIDER_SITE_OTHER): Payer: Medicaid Other | Admitting: Internal Medicine

## 2016-06-05 VITALS — BP 122/74 | HR 102 | Ht 71.0 in | Wt 177.6 lb

## 2016-06-05 DIAGNOSIS — J441 Chronic obstructive pulmonary disease with (acute) exacerbation: Secondary | ICD-10-CM

## 2016-06-05 DIAGNOSIS — J449 Chronic obstructive pulmonary disease, unspecified: Secondary | ICD-10-CM | POA: Diagnosis not present

## 2016-06-05 MED ORDER — PREDNISONE 10 MG PO TABS: ORAL_TABLET | ORAL | 0 refills | Status: DC

## 2016-06-05 MED ORDER — TIOTROPIUM BROMIDE MONOHYDRATE 2.5 MCG/ACT IN AERS: 2.0000 | INHALATION_SPRAY | Freq: Every day | RESPIRATORY_TRACT | 11 refills | Status: DC

## 2016-06-05 MED ORDER — TIOTROPIUM BROMIDE MONOHYDRATE 2.5 MCG/ACT IN AERS: 2.0000 | INHALATION_SPRAY | Freq: Every day | RESPIRATORY_TRACT | 0 refills | Status: DC

## 2016-06-05 NOTE — Patient Instructions (Addendum)
Prednisone 10 mg take 2 daily with breakfast until better then 1 daily  Prilosec Take two of them  30-60 min before first meal of the day and pepcid 20 mg at bedtime until return  GERD (REFLUX)  is an extremely common cause of respiratory symptoms just like yours , many times with no obvious heartburn at all.    It can be treated with medication, but also with lifestyle changes including elevation of the head of your bed (ideally with 6 inch  bed blocks),  Smoking cessation, avoidance of late meals, excessive alcohol, and avoid fatty foods, chocolate, peppermint, colas, red wine, and acidic juices such as orange juice.  NO MINT OR MENTHOL PRODUCTS SO NO COUGH DROPS  USE SUGARLESS CANDY INSTEAD (Jolley ranchers or Stover's or Life Savers) or even ice chips will also do - the key is to swallow to prevent all throat clearing. NO OIL BASED VITAMINS - use powdered substitutes.    Plan A = Automatic = first thing in am Dulera 200 x 2 and spiriva x 2 then the duelra  200 x 2  At 12 hours later   Plan B = Backup Only use your albuterol as a rescue medication to be used if you can't catch your breath by resting or doing a relaxed purse lip breathing pattern.  - The less you use it, the better it will work when you need it. - Ok to use the inhaler up to 2 puffs  every 4 hours if you must but call for appointment if use goes up over your usual need - Don't leave home without it !!  (think of it like the spare tire for your car)   Plan C = Crisis - only use your albuterol nebulizer if you first try Plan B and it fails to help > ok to use the nebulizer up to every 4 hours but if start needing it regularly call for immediate appointment    Please schedule a follow up office visit in 2 weeks, sooner if needed  with all medications /inhalers/ solutions in hand so we can verify exactly what you are taking. This includes all medications from all doctors and over the counters

## 2016-06-05 NOTE — Telephone Encounter (Signed)
Spoke with pt, c/o increased sob, chest tightness X2 weeks.  Pt has been seen in ED twice this month.  Pt has been using albuterol nebs more frequently than prescribed.  Last seen 10/2013.  Spoke with Magda Paganini who approved adding pt on to [facility] schedule.  Pt aware.  Nothing further needed.

## 2016-06-05 NOTE — Progress Notes (Signed)
Subjective:    Patient ID: Taylor [name], male    DOB: [birth year] MRN: [id number]    Brief patient profile:  61  yowm quit smoking 2015  sinus congestion with spring just in 2014 took a few clariton referred by ER for sob after evaluation there for ? Ihd> ruled out clinically but GOLD IV COPD documented 12/15/2012    History of Present Illness   Previous eval at [facility] for sob Jan 2014 Admit date: 02/03/2012  Discharge date: 02/06/2012  Discharge Diagnoses:  Principal Problem:  *COPD (chronic obstructive pulmonary disease)  Active Problems:  Colon cancer, sigmoid  Thrombocytopenia  Hyperglycemia  Dyspepsia  Bipolar 1 disorder  Depression  Discharge Condition: Stable and improved  Diet recommendation: Regular  Filed Weights    02/04/12 0550  02/05/12 0500  02/06/12 0500   Weight:  82.8 kg (182 lb 8.7 oz)  82.9 kg (182 lb 12.2 oz)  84.3 kg (185 lb 13.6 oz)   History of present illness:  61 year old male with 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 CT scan chest 05/15/2011 showed no evidence of metastatic disease in the chest. His preoperative CEA was 2.4 in March 2013 and again normal in October 2013.  Patient presented today with worsening shortness of breath over past few days prior to this admission associated with cough and subjective fever patient reported productive cough, yellowish sputum. Patient tried using albuterol with only minimal symptomatic relief. Patient decided to come to emergency for further evaluation.  In ED, evaluation included chest x-ray which showed emphysematous changes consistent with COPD and/or asthma. Patient was given nebulizer treatments and Solu-Medrol in emergency room but continued to be short of breath with wheezing on physical exam.  Hospital  Course:  #1 acute COPD exacerbation  Patient had presented with worsening shortness of breath a few days prior to admission with cough and subjective fevers of yellowish sputum. Patient was admitted to the telemetry floor he was placed on nebulizer treatments as well as IV Solu-Medrol. Bronchodilators were added to his regimen as well as oral Levaquin. Patient improved clinically on a daily basis his IV steroids were tapered down. Patient will be discharged home on a steroid taper, Spiriva, Symbicort, and albuterol MDI as well as 5 days of oral Levaquin. Patient be discharged home in stable and improved condition to followup with his PCP as outpatient.   09/17/2012 1st [facility] office visit/ Dillian Feig cc doe 100 ft flat stops half way to mailbox all summer  2014 and no better with saba  Assoc with nasal congestion. Has luq pain worse with eat, better when lie down, not with exertion rec Start tudorza one twice daily  Only use your albuterol as a rescue medication   12/15/2012 f/u ov/Capricia Serda still smoking  re: GOLD IV COPD  Chief Complaint  Patient presents with  . Follow-up    PFT done today.  Feels Qvar is not helping using rescue inhaler twice as often.  albuterol use first thing in am 5am does help breathing and chest congestion  some LUQ pain resolved.  Doe x > slow adls >>d/c qvar , rx Dulera. Cont on Spiriva   01/25/2013 NP  [facility] follow up  Admitted 12/20-01/22/13 for VDRF , COPD exacerbation , hypoxia, bronchitis d/t human metapenumovirus.  Tx w/ brief vent support., abx, steroids and BD.  Discharged on steroid taper.  PT coming to home.  Has not had flu shot.  Depends on samples , although he has medicaid.  Discussed medication compliance.  Did not pick up prednisone rx  rec Pick up  Prednisone taper as directed, begin today  Restart Dulera  2puffs Twice daily  , pick rx at pharmacy.  Continue on Spiriva 1 puff daily  Flu shot today    06/17/2013 f/u ov/Elanie Hammitt re: GOLD  IV/ still smoking / supposed to be on spiriva/dulera  Chief Complaint  Patient presents with  . Acute Visit    Pt c/o increased SOB and chest tightness for the past month.  He is using proair approx 4 times per day.  He also c/o prod cough with minimal clear sputum.   confused with meds by name/ most of his cough is am worse in cold weather  rec Prednisone 10 mg take  4 each am x 2 days,   2 each am x 2 days,  1 each am x 2 days and stop  Dulera 200 Take 2 puffs first thing in am and then another 2 puffs about 12 hours later.  Only use your albuterol (proair)as a rescue medication  Pantoprazole (protonix) 40 mg   Take 30-60 min before first meal of the day and Pepcid 20 mg one bedtime until return to office GERD diet    08/09/2013 f/u ov/Alphonsine Minium re: COPD  III  / still smoking/ on spiriva/dulera 200 Chief Complaint  Patient presents with  . Follow-up    Pt states that his breathing has improved some since last visit. He does state that his breathing is worse when exposed to the humidity. He is using rescue inhaler 4-5 times per day.   never uses saba indoors, only when over does it outdoors, overall better to his satisfaction   rec Work on inhaler technique:   The key is to stop smoking completely before smoking completely stops you!  Restart protonix (pantaprazole) Take 30-60 min before first meal of the day      11/09/2013 f/u ov/Kru Allman re: copd III/ still smoking much worse p ran out of spiriva / dulera  Chief Complaint  Patient presents with  . Follow-up    Pt states his breathing seems worse since last visit. He is using rescue inhaler approx 10 times per day. He c/o increased cough x 1 month- prod with moderate clear sputum.   used saba already am of ov "out of habit" rec Prednisone 10 mg take  4 each am x 2 days,   2 each am x 2 days,  1 each am x 2 days and stop  Protonix Take 30-60 min before first meal of the day and stay on it for now  Only use your albuterol (proair) as a  rescue medication     06/05/2016 acute extended ov/Carmella Kees re:  Copd III/ quit smoking 2015 maint on dulera /spiriva  Chief Complaint  Patient presents with  . Breathing Issues    Increased SOB for the past 2 weeks. Has been in the ER twice for this. States that his current inhalers are not working. Feels like he is about to pass out at times.   One month prior to OV  "ok" on dulera / spiriva  But still daily hfa albuterol /albuterol neb and has needed this for at least 10 years Gradually worse sob x 2 weeks to point even sob at rest Wakes up and immediately grabs for inhaler x 2  weeks prednisone helps transiently each time he takes it   No obvious day to day or daytime variability or assoc excess/ purulent sputum or mucus plugs or hemoptysis or cp or chest tightness, subjective wheeze or overt sinus or hb symptoms. No unusual exp hx or h/o childhood pna/ asthma or knowledge of premature birth.  Sleeping ok without nocturnal  or early am exacerbation  of respiratory  c/o's or need for noct saba. Also denies any obvious fluctuation of symptoms with weather or environmental changes or other aggravating or alleviating factors except as outlined above   Current Medications, Allergies, Complete Past Medical History, Past Surgical History, Family History, and Social History were reviewed in [employer] record.  ROS  The following are not active complaints unless bolded sore throat, dysphagia, dental problems, itching, sneezing,  nasal congestion or excess/ purulent secretions, ear ache,   fever, chills, sweats, unintended wt loss, classically pleuritic or exertional cp,  orthopnea pnd or leg swelling, presyncope, palpitations, abdominal pain, anorexia, nausea, vomiting, diarrhea  or change in bowel or bladder habits, change in stools or urine, dysuria,hematuria,  rash, arthralgias, visual complaints, headache, numbness, weakness or ataxia or problems with walking or coordination,   change in mood/affect or memory.                Objective:   Physical Exam    amb wm nad with freq throat clearing   06/05/2016       177 06/17/2013       178  > 08/09/2013 184 >  11/09/2013  176  Wt Readings from Last 3 Encounters:  02/22/13 186 lb (84.369 kg)  01/25/13 180 lb 9.6 oz (81.92 kg)  01/18/13 156 lb 15.5 oz (71.2 kg)      Vital signs reviewed - Note on arrival 02 sats  93% on RA      HEENT: nl dentition, turbinates bilaterally, and oropharynx. Nl external ear canals without cough reflex   NECK :  without JVD/Nodes/TM/ nl carotid upstrokes bilaterally   LUNGS: no acc muscle use,  slt barrel contour chest with faint pan exp wheezing bilaterally and increased exp time    CV:  RRR  no s3 or murmur or increase in P2, and no edema   ABD:  soft and nontender with nl inspiratory excursion in the supine position. No bruits or organomegaly appreciated, bowel sounds nl  MS:  Nl gait/ ext warm without deformities, calf tenderness, cyanosis  - mild bilateral clubbing No obvious joint restrictions   SKIN: warm and dry without lesions    NEURO:  alert, approp, nl sensorium with  no motor or cerebellar deficits apparent.    I personally reviewed images and agree with radiology impression as follows:  CXR:   05/25/16 The lungs are well-aerated and clear. There is no evidence of focal opacification, pleural effusion or pneumothorax. The heart is normal in size; the mediastinal contour is within normal limits. No acute osseous abnormalities are seen. Chronic right-sided rib deformities are noted.     Assessment & Plan:

## 2016-06-06 NOTE — Assessment & Plan Note (Addendum)
- 09/17/2012  Walked RA x 3 laps @ 185 ft each stopped due to  End of study, no desat - Spirometry 09/17/2012 FEV1  0.80 (21%) ratio 52  - PFT's 12/15/2012  FEV1  0.89 (24%) with Ratio 38 p am saba and DLCO 44% corrects to 68%  - PFTs 12/07/13      FEV1   1.18 (32%)  Ratio 40  And no change p saba DLCO 50 corrects to 68% took neither spiriva nor dulera before pfts - Alpha one screen 12/15/2012 >  MM  -  06/06/2016 p extensive coaching HFA effectiveness =    90% from a baseline of 50% (Ti too short)    DDX of  difficult airways management almost all start with A and  include Adherence, Ace Inhibitors, Acid Reflux, Active Sinus Disease, Alpha 1 Antitripsin deficiency, Anxiety masquerading as Airways dz,  ABPA,  Allergy(esp in young), Aspiration (esp in elderly), Adverse effects of meds,  Active smokers, A bunch of PE's (a small clot burden can't cause this syndrome unless there is already severe underlying pulm or vascular dz with poor reserve) plus two Bs  = Bronchiectasis and Beta blocker use..and one C= CHF    In this case Adherence is the biggest issue and starts with  inability to use HFA effectively and also  understand that SABA treats the symptoms but doesn't get to the underlying problem (inflammation).  I used  the analogy of putting steroid cream on a rash to help explain the meaning of topical therapy and the need to get the drug to the target tissue. - see hfa teaching    - needs to return with all meds in hand using a trust but verify approach to confirm accurate Medication  Reconciliation The principal here is that until we are certain that the  patients are doing what we've asked, it makes no sense to ask them to do more.    ? 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 Adverse effects of dpi > changed to spiriva respimat   ? Acid (or non-acid) GERD > always difficult to exclude as up to 75% of pts in some series report no  assoc GI/ Heartburn symptoms> rec max (24h)  acid suppression and diet restrictions/ reviewed and instructions given in writing.    ? Anxiety > usually at the bottom of this list of usual suspects but should be much higher on this pt's based on H and P and note already on psychotropics  And this may well interfere in adherence if not directly contributing to his symptoms > f/u advised   ? Allergy/ asthmatic component > Prednisone 10 mg take 2 daily with breakfast until better then 1 daily until seen    Will f/u in 2 weeks to do accurate med reconciliation - see avs for instructions unique to this ov        I had an extended discussion with the patient reviewing all relevant studies completed to date and  lasting 25 minutes of a 40  minute acute office visit to re-establish     re  severe non-specific but potentially very serious refractory respiratory symptoms of uncertain and potentially multiple  etiologies.   Each maintenance medication was reviewed in detail including most importantly the difference between maintenance and prns and under what circumstances the prns are to be triggered using an action plan format that is not reflected in  the computer generated alphabetically organized AVS.    Please see AVS for specific instructions unique to this office visit that I personally wrote and verbalized to the the pt in detail and then reviewed with pt  by my nurse highlighting any changes in therapy/plan of care  recommended at today's visit.

## 2016-06-11 ENCOUNTER — Ambulatory Visit (INDEPENDENT_AMBULATORY_CARE_PROVIDER_SITE_OTHER): Payer: Medicaid Other | Admitting: Pulmonary Disease

## 2016-06-11 ENCOUNTER — Encounter: Payer: Self-pay | Admitting: Pulmonary Disease

## 2016-06-11 DIAGNOSIS — J449 Chronic obstructive pulmonary disease, unspecified: Secondary | ICD-10-CM

## 2016-06-11 MED ORDER — ALBUTEROL SULFATE (2.5 MG/3ML) 0.083% IN NEBU: 2.5000 mg | INHALATION_SOLUTION | Freq: Four times a day (QID) | RESPIRATORY_TRACT | 12 refills | Status: DC | PRN

## 2016-06-11 MED ORDER — FLUTICASONE FUROATE-VILANTEROL 100-25 MCG/INH IN AEPB: 1.0000 | INHALATION_SPRAY | Freq: Every day | RESPIRATORY_TRACT | 0 refills | Status: DC

## 2016-06-11 MED ORDER — PREDNISONE 10 MG PO TABS: ORAL_TABLET | ORAL | 0 refills | Status: DC

## 2016-06-11 NOTE — Patient Instructions (Signed)
Stop smoking Use Dulera 2 puffs twice a day no matter how you feel You Spiriva daily how you feel Use albuterol 3-4 times a day as needed for chest tightness and wheezing Take the prednisone as prescribed Follow-up with [place] as previously arranged, call us if you need help between now and then

## 2016-06-11 NOTE — Addendum Note (Signed)
Addended by: Len Blalock on: 06/11/2016 11:20 AM   Modules accepted: Orders

## 2016-06-11 NOTE — Progress Notes (Signed)
Subjective:    Patient ID: Taylor [name], male    DOB: [birth year], 61 y.o.   MRN: [id number]  Synopsis: Patient of Dr. Melvyn Novas has gold grade D COPD  HPI Chief Complaint  Patient presents with  . Acute Visit    MW pt seen 06/05/16 c/o worsening SOB at rest and exertion, chest tightness, nonprod cough worse qam.      Taylor [name] says that he has been having a hard time breathing even with taking a shower.  Overall his dyspnea is seere and he really can't do much at all.  He sometimes feels like he is going to pass out.  eh has been wheezing cough.  He is coughing more, no mucus production.  He has a cough which is worse when lying flat.  He says the dyspnea is worse when lying.  No leg swelling.  No known heart problems. No chest pain.  He has been using nebulizers more.  He has been using Spiriva at 2x the dose recommended.  He ran out of his [facility].  He has been using albuterol   Past Medical History:  Diagnosis Date  . Bipolar 1 disorder ([place]) 02/05/2012  . Cancer ([place]) 04/11/11   adenocarcinoma of colon, 7/19 nodes pos.FINISHED CHEMO/DR. SHERRILL  . Colon cancer ([place]) 04/11/2011  . COPD (chronic obstructive pulmonary disease) ([place])    SMOKER  . Depression    Bipolar disorder  . Full dentures   . Hemorrhoids   . Inguinal hernia    RIGHT- PAINFUL  . Rib fractures    hx of  . Shortness of breath       Review of Systems     Objective:   Physical Exam Vitals:   06/11/16 1052  BP: 126/74  Pulse: 93  Temp: 98.7 F (37.1 C)  TempSrc: Oral  SpO2: 94%  Weight: 175 lb 6.4 oz (79.6 kg)  Height: 5\' 11"  (1.803 m)   Gen: chronically ill appearing HENT: OP clear, TM's clear, neck supple PULM: Poor air movement, normal percussion CV: RRR, no mgr, trace edema GI: BS+, soft, nontender Derm: no cyanosis or rash Psyche: normal mood and affect   2015 spirometry testing showed FEV1 of 1.8 L 32% predicted with clear airflow obstruction Alpha 1 screen MM May 2018 chest x-ray  images independently reviewed showing emphysema bilaterally    Assessment & Plan:  COPD  GOLD III Taylor [name] has very severe dyspnea secondary to his severe COPD is demonstrated in lung function testing in 2014 and 2015. This is exacerbated by the fact that he has been smoking cigarettes lately and is not taking inappropriate controller medication regimen. He is also not taking the prednisone which was prescribed a week ago.  There is nothing on his CXR to suggest anything other than emphysema.  It sounds as if one major barrier to him taking medications is insurance. Will have Medicaid by the end of the month.  Plan: Stop smoking Take Dulera two puffs twice a day (samples of Breo given today until he can get medicaid) Take Spiriva daily Continue albuterol Long slow course of prednisone prescribed Keep appointment for medication reconciliation in 6 weeks May need to consider hospice if symptoms continue to be this severe.  > 50% of this 26 minute visit spent face to face    [name] Prescriptions:  .  albuterol (PROVENTIL HFA;VENTOLIN HFA) 108 (90 Base) MCG/ACT inhaler, Inhale 1-2 puffs into the lungs every 6 (six) hours as needed for wheezing., Disp: 1  Inhaler, Rfl: 0 .  ARIPiprazole (ABILIFY) 2 MG tablet, Take 2 mg by mouth every morning. , Disp: , Rfl:  .  escitalopram (LEXAPRO) 20 MG tablet, Take 20 mg by mouth every morning. , Disp: , Rfl:  .  lovastatin (MEVACOR) 20 MG tablet, Take 20 mg by mouth at bedtime., Disp: , Rfl:  .  mometasone-formoterol (DULERA) 200-5 MCG/ACT AERO, INHALE 2 PUFFS INTO THE LUNGS FIRST THING IN THE MORNING THEN ANOTHER 2 PUFFS 12 HOURS LATER., Disp: 12 g, Rfl: 11 .  Tiotropium Bromide Monohydrate (SPIRIVA RESPIMAT) 2.5 MCG/ACT AERS, Inhale 2 puffs into the lungs daily., Disp: 1 Inhaler, Rfl: 11 .  albuterol (PROVENTIL) (2.5 MG/3ML) 0.083% nebulizer solution, Take 3 mLs (2.5 mg total) by nebulization every 6 (six) hours as needed for wheezing or  shortness of breath., Disp: 75 mL, Rfl: 12 .  predniSONE (DELTASONE) 10 MG tablet, 20mg  daily for 10 days, then 10mg  daily for 10 days, Disp: 30 tablet, Rfl: 0

## 2016-06-11 NOTE — Assessment & Plan Note (Signed)
Taylor [name] has very severe dyspnea secondary to his severe COPD is demonstrated in lung function testing in 2014 and 2015. This is exacerbated by the fact that he has been smoking cigarettes lately and is not taking inappropriate controller medication regimen. He is also not taking the prednisone which was prescribed a week ago.  There is nothing on his CXR to suggest anything other than emphysema.  It sounds as if one major barrier to him taking medications is insurance. Will have Medicaid by the end of the month.  Plan: Stop smoking Take Dulera two puffs twice a day (samples of Breo given today until he can get medicaid) Take Spiriva daily Continue albuterol Long slow course of prednisone prescribed Keep appointment for medication reconciliation in 6 weeks May need to consider hospice if symptoms continue to be this severe.  > 50% of this 26 minute visit spent face to face

## 2016-06-24 ENCOUNTER — Ambulatory Visit: Payer: Self-pay | Admitting: Adult Health

## 2016-06-28 ENCOUNTER — Telehealth: Payer: Self-pay | Admitting: Internal Medicine

## 2016-06-28 MED ORDER — TIOTROPIUM BROMIDE MONOHYDRATE 2.5 MCG/ACT IN AERS: 2.0000 | INHALATION_SPRAY | Freq: Every day | RESPIRATORY_TRACT | 11 refills | Status: DC

## 2016-06-28 MED ORDER — ALBUTEROL SULFATE HFA 108 (90 BASE) MCG/ACT IN AERS: 1.0000 | INHALATION_SPRAY | Freq: Four times a day (QID) | RESPIRATORY_TRACT | 2 refills | Status: DC | PRN

## 2016-06-28 MED ORDER — ALBUTEROL SULFATE (2.5 MG/3ML) 0.083% IN NEBU: 2.5000 mg | INHALATION_SOLUTION | Freq: Four times a day (QID) | RESPIRATORY_TRACT | 2 refills | Status: DC | PRN

## 2016-06-28 MED ORDER — MOMETASONE FURO-FORMOTEROL FUM 200-5 MCG/ACT IN AERO: INHALATION_SPRAY | RESPIRATORY_TRACT | 11 refills | Status: DC

## 2016-06-28 NOTE — Telephone Encounter (Signed)
Rxs were all refilled and pt made aware

## 2016-07-01 ENCOUNTER — Encounter (HOSPITAL_COMMUNITY): Payer: Self-pay

## 2016-07-01 ENCOUNTER — Emergency Department (HOSPITAL_COMMUNITY)
Admission: EM | Admit: 2016-07-01 | Discharge: 2016-07-01 | Disposition: A | Discharge status: Home / Self Care | Payer: Self-pay | Attending: Emergency Medicine | Admitting: Emergency Medicine

## 2016-07-01 DIAGNOSIS — Z79899 Other long term (current) drug therapy: Secondary | ICD-10-CM | POA: Insufficient documentation

## 2016-07-01 DIAGNOSIS — C61 Malignant neoplasm of prostate: Secondary | ICD-10-CM | POA: Insufficient documentation

## 2016-07-01 DIAGNOSIS — R350 Frequency of micturition: Secondary | ICD-10-CM | POA: Insufficient documentation

## 2016-07-01 DIAGNOSIS — J449 Chronic obstructive pulmonary disease, unspecified: Secondary | ICD-10-CM | POA: Insufficient documentation

## 2016-07-01 DIAGNOSIS — F17210 Nicotine dependence, cigarettes, uncomplicated: Secondary | ICD-10-CM | POA: Insufficient documentation

## 2016-07-01 DIAGNOSIS — Z85038 Personal history of other malignant neoplasm of large intestine: Secondary | ICD-10-CM | POA: Insufficient documentation

## 2016-07-01 LAB — BASIC METABOLIC PANEL
ANION GAP: 8 (ref 5–15)
BUN: 8 mg/dL (ref 6–20)
CO2: 26 mmol/L (ref 22–32)
Calcium: 8.6 mg/dL — ABNORMAL LOW (ref 8.9–10.3)
Chloride: 103 mmol/L (ref 101–111)
Creatinine, Ser: 0.97 mg/dL (ref 0.61–1.24)
GLUCOSE: 98 mg/dL (ref 65–99)
POTASSIUM: 4 mmol/L (ref 3.5–5.1)
SODIUM: 137 mmol/L (ref 135–145)

## 2016-07-01 LAB — URINALYSIS, ROUTINE W REFLEX MICROSCOPIC
BACTERIA UA: NONE SEEN
BILIRUBIN URINE: NEGATIVE
Glucose, UA: 50 mg/dL — AB
Hgb urine dipstick: NEGATIVE
Ketones, ur: 5 mg/dL — AB
Leukocytes, UA: NEGATIVE
Nitrite: NEGATIVE
PROTEIN: 30 mg/dL — AB
SPECIFIC GRAVITY, URINE: 1.021 (ref 1.005–1.030)
pH: 5 (ref 5.0–8.0)

## 2016-07-01 LAB — CBC WITH DIFFERENTIAL/PLATELET
BASOS ABS: 0 10*3/uL (ref 0.0–0.1)
BASOS PCT: 0 %
Eosinophils Absolute: 0.2 10*3/uL (ref 0.0–0.7)
Eosinophils Relative: 2 %
HEMATOCRIT: 46 % (ref 39.0–52.0)
HEMOGLOBIN: 15.5 g/dL (ref 13.0–17.0)
LYMPHS PCT: 20 %
Lymphs Abs: 1.5 10*3/uL (ref 0.7–4.0)
MCH: 31 pg (ref 26.0–34.0)
MCHC: 33.7 g/dL (ref 30.0–36.0)
MCV: 92 fL (ref 78.0–100.0)
Monocytes Absolute: 0.8 10*3/uL (ref 0.1–1.0)
Monocytes Relative: 10 %
NEUTROS ABS: 5.2 10*3/uL (ref 1.7–7.7)
NEUTROS PCT: 68 %
Platelets: 168 10*3/uL (ref 150–400)
RBC: 5 MIL/uL (ref 4.22–5.81)
RDW: 13.1 % (ref 11.5–15.5)
WBC: 7.7 10*3/uL (ref 4.0–10.5)

## 2016-07-01 NOTE — ED Provider Notes (Signed)
[facility] DEPT Provider Note   CSN: [id number] Arrival date & time: 07/01/16  5681     History   Chief Complaint Chief Complaint  Patient presents with  . Urinary Frequency  . Dysuria    HPI Taylor [name] is a 61 y.o. male with past medical history of prostate cancer recently diagnosed in december, bipolar disorder, and copd who presents to the emergency department today for urinary frequency and dysuria x 2 weeks. The patient notes he was diagnosed with Prostate cancer while incarcerated. He was released 1 month ago. He is not receiving treatment at this time as he is awaiting disability. He does not have an oncologist. The patient states over the last 2 weeks he has been having increase in urinary urgency, hesitancy, and frequency. The patient states that he will "all the sudden feel like I am going to urinate on myself but when I go to urinate nothing will come out". He explains he must try for several minutes before he is able to produce urine and when he does he is not able to maintain a steady stream. He also complains that it is painful during urination. He goes roughly 1x hour and wakes up 3x/night to urinate. The patient denies hematuria, coca-colored urine, fever, chills, abdominal pain, nausea, penile pain, testicular pain or swelling, penile discharge, rash, leg swelling. No recent antibiotic use. No recent URI like symptoms preceeding.   HPI  Past Medical History:  Diagnosis Date  . Bipolar 1 disorder ([place]) 02/05/2012  . Cancer ([place]) 04/11/11   adenocarcinoma of colon, 7/19 nodes pos.FINISHED CHEMO/DR. SHERRILL  . Colon cancer ([place]) 04/11/2011  . COPD (chronic obstructive pulmonary disease) ([place])    SMOKER  . Depression    Bipolar disorder  . Full dentures   . Hemorrhoids   . Inguinal hernia    RIGHT- PAINFUL  . Rib fractures    hx of  . Shortness of breath     Patient Active Problem List   Diagnosis Date Noted  . Acute bronchitis due to human  metapneumovirus 01/16/2013  . Alcohol abuse 01/09/2013  . COPD exacerbation ([place]) 01/09/2013  . Chest pain 01/09/2013  . Acute respiratory failure with hypoxia ([place]) 01/09/2013  . Smoker 12/16/2012  . Inguinal hernia unilateral, non-recurrent, right 05/01/2012  . Dyspepsia 02/05/2012  . Bipolar 1 disorder ([place]) 02/05/2012  . Depression 02/05/2012  . Hyperglycemia 02/04/2012  . COPD  GOLD III 02/03/2012  . Thrombocytopenia ([place]) 02/03/2012  . 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 COLON SURGERY  04/11/11   Sigmoid colectomy  . COLOSTOMY REVISION  04/11/2011   Procedure: COLON RESECTION SIGMOID;  Surgeon: [name], MD;  Location: [facility] ORS;  Service: General;  Laterality: N/A;  low anterior colon resection   . INGUINAL HERNIA REPAIR Right 05/01/2012   Procedure: HERNIA REPAIR INGUINAL ADULT;  Surgeon: [name], MD;  Location: [facility];  Service: General;  Laterality: Right;  . INSERTION OF MESH Right 05/01/2012   Procedure: INSERTION OF MESH;  Surgeon: [name], MD;  Location: [facility];  Service: General;  Laterality: Right;  . PORT-A-CATH REMOVAL Left 05/01/2012   Procedure: REMOVAL Infusion Port;  Surgeon: [name], MD;  Location: [facility];  Service: General;  Laterality: Left;  . PORTACATH PLACEMENT    . PORTACATH PLACEMENT  05/02/2011   Procedure: INSERTION PORT-A-CATH;  Surgeon: [name], MD;  Location: [facility];  Service: General;  Laterality: N/A;       Home Medications    Prior to Admission medications   Medication Sig Start Date End Date Taking? Authorizing Provider  albuterol (PROVENTIL HFA;VENTOLIN HFA) 108 (90 Base) MCG/ACT inhaler Inhale 1-2 puffs into the lungs every 6 (six) hours as needed for wheezing. 06/28/16  Yes [name], MD  albuterol (PROVENTIL) (2.5 MG/3ML) 0.083% nebulizer solution Take 3 mLs (2.5 mg total) by nebulization every 6 (six) hours as needed for wheezing or shortness of breath.  06/28/16  Yes [name], MD  ARIPiprazole (ABILIFY) 2 MG tablet Take 2 mg by mouth every morning.    Yes [provider]  escitalopram (LEXAPRO) 20 MG tablet Take 20 mg by mouth every morning.    Yes [provider]  fluticasone furoate-vilanterol (BREO ELLIPTA) 100-25 MCG/INH AEPB Inhale 1 puff into the lungs daily. 06/11/16  Yes Juanito Doom, MD  lovastatin (MEVACOR) 20 MG tablet Take 20 mg by mouth at bedtime.   Yes [provider]  mometasone-formoterol (DULERA) 200-5 MCG/ACT AERO INHALE 2 PUFFS INTO THE LUNGS FIRST THING IN THE MORNING THEN ANOTHER 2 PUFFS 12 HOURS LATER. 06/28/16  Yes [name], MD  Tiotropium Bromide Monohydrate (SPIRIVA RESPIMAT) 2.5 MCG/ACT AERS Inhale 2 puffs into the lungs daily. 06/28/16  Yes [name], MD  predniSONE (DELTASONE) 10 MG tablet 20mg  daily for 10 days, then 10mg  daily for 10 days Patient not taking: Reported on 07/01/2016 06/11/16   Juanito Doom, MD    Family History Family History  Problem Relation Age of Onset  . Heart disease Father   . Lung cancer Maternal Uncle        smoked    Social History Social History  Substance Use Topics  . Smoking status: Current Every Day Smoker    Packs/day: 0.50    Years: 38.00    Types: Cigarettes    Last attempt to quit: 02/11/2014  . Smokeless tobacco: Former Systems developer  . Alcohol use No     Comment: occasional weekends, 1 six pack or 2 six packs per weekend. Last drink  30 months ago.      Allergies   Codeine   Review of Systems Review of Systems  All other systems reviewed and are negative.    Physical Exam Updated Vital Signs BP (!) 138/93 (BP Location: Left Arm)   Pulse (!) 108   Temp 97.9 F (36.6 C) (Oral)   Resp 16   Ht 5\' 11"  (1.803 m)   Wt 79.4 kg (175 lb)   SpO2 97%   BMI 24.41 kg/m   Physical Exam  Constitutional: He appears well-developed and well-nourished.  HENT:  Head: Normocephalic and atraumatic.  Mouth/Throat: Oropharynx  is clear and moist and mucous membranes are normal.  Eyes: Conjunctivae are normal. Pupils are equal, round, and reactive to light.  Neck: Neck supple.  Cardiovascular: Normal rate, regular rhythm and intact distal pulses.   No murmur heard. Pulmonary/Chest: Effort normal and breath sounds normal. He exhibits no tenderness.  Abdominal: Soft. Bowel sounds are normal. There is no tenderness. There is no rebound, no guarding and no CVA tenderness.  Genitourinary: Testes normal and penis normal. Right testis shows no swelling and no tenderness. Left testis shows no swelling and no tenderness. Circumcised. No penile erythema or penile tenderness. No discharge found.  Musculoskeletal: He exhibits no edema.  Lymphadenopathy:    He has no cervical adenopathy.  Neurological: He is alert.  Skin: No  rash noted. He is not diaphoretic.  Psychiatric: He has a normal mood and affect.  Nursing note and vitals reviewed.  ED Treatments / Results  Labs (all labs ordered are listed, but only abnormal results are displayed) Labs Reviewed  URINALYSIS, ROUTINE W REFLEX MICROSCOPIC - Abnormal; Notable for the following:       Result Value   APPearance HAZY (*)    Glucose, UA 50 (*)    Ketones, ur 5 (*)    Protein, ur 30 (*)    Squamous Epithelial / LPF 0-5 (*)    All other components within normal limits  BASIC METABOLIC PANEL - Abnormal; Notable for the following:    Calcium 8.6 (*)    All other components within normal limits  CBC WITH DIFFERENTIAL/PLATELET    EKG  EKG Interpretation None       Radiology No results found.  Procedures Procedures (including critical care time)  Medications Ordered in ED Medications - No data to display   Initial Impression / Assessment and Plan / ED Course  I have reviewed the triage vital signs and the nursing notes.  Pertinent labs & imaging results that were available during my care of the patient were reviewed by me and considered in my medical  decision making (see chart for details).     This 61 year old male who is recently diagnosed with prostate cancer in December who presents with 2 weeks of increase urinary frequency, dysuria, hesitancy, nocturia x 2 weeks. No associated penile/testicular pain/swelling or discharge. On presentation the patient is afebrile and non-toxic appearing. The patient has no abdominal exam, no cva tenderness, normal genitalia exam. Urinalysis obtained. Basic labs obtained to check for leukocytosis and kidney function.    The patient UA shows no evidence of a UTI. No hgb or RBC present. CBC without leukocytosis. Kidney function intake. No electrolyte abnormalities.   As patient is uninsuranced and worry for poor follow up, case consulted with case management. They set up an appointment for primary care for Thursday where further care can be directed for his prostate cancer. I believe this is likely the cause of the patients symptoms. Return precautions given. Patient told they can return at anytime for worsening or new concerning symptoms. Patient verbalizes understanding. All questions answered. No further questions at this time.    Final Clinical Impressions(s) / ED Diagnoses   Final diagnoses:  Urinary frequency  Prostate cancer [facility])    New Prescriptions New Prescriptions   No medications on file     Lorelle Gibbs 07/01/16 1118    Lajean Saver, MD 07/02/16 [phone]

## 2016-07-01 NOTE — ED Notes (Signed)
Requested urine sample from patient

## 2016-07-01 NOTE — Discharge Instructions (Signed)
You did not have any evidence of infection in your urine. There was no blood in your urine either. Your kidney function was normal. You did not have a fever today or elevated white blood cell count that would suggest infection. I believe your prostate cancer is likely the cause of your symptoms.   We scheduled you an appointment for a follow up with a primary care provider scheduled for Thursday 6/14 at 1:15pm so you can discuss further care for your prostate cancer.The information for this is on the following page.   If you have worsening symptoms or new concerning symptoms you can return to the emergency department for re-evaluation.

## 2016-07-01 NOTE — ED Triage Notes (Signed)
Patient states that he ws diagnosed with prostate cancer a year ago while incarcerated and is now having urinary frequency. Patient states he has not had visible blood in his urine.

## 2016-07-01 NOTE — ED Notes (Signed)
Bed: WA14 Expected date:  Expected time:  Means of arrival:  Comments: triage 

## 2016-07-04 ENCOUNTER — Ambulatory Visit: Payer: Self-pay | Admitting: Family Medicine

## 2016-07-25 ENCOUNTER — Ambulatory Visit: Payer: Self-pay | Admitting: Internal Medicine

## 2016-10-18 ENCOUNTER — Encounter (HOSPITAL_COMMUNITY): Payer: Self-pay

## 2016-10-18 ENCOUNTER — Emergency Department (HOSPITAL_COMMUNITY)
Admission: EM | Admit: 2016-10-18 | Discharge: 2016-10-18 | Disposition: A | Discharge status: Home / Self Care | Payer: Self-pay | Attending: Emergency Medicine | Admitting: Emergency Medicine

## 2016-10-18 ENCOUNTER — Emergency Department (HOSPITAL_COMMUNITY): Payer: Self-pay

## 2016-10-18 DIAGNOSIS — J441 Chronic obstructive pulmonary disease with (acute) exacerbation: Secondary | ICD-10-CM | POA: Insufficient documentation

## 2016-10-18 DIAGNOSIS — R1013 Epigastric pain: Secondary | ICD-10-CM | POA: Insufficient documentation

## 2016-10-18 DIAGNOSIS — Z87891 Personal history of nicotine dependence: Secondary | ICD-10-CM | POA: Insufficient documentation

## 2016-10-18 DIAGNOSIS — Z79899 Other long term (current) drug therapy: Secondary | ICD-10-CM | POA: Insufficient documentation

## 2016-10-18 LAB — I-STAT CHEM 8, ED
BUN: 16 mg/dL (ref 6–20)
CHLORIDE: 97 mmol/L — AB (ref 101–111)
Calcium, Ion: 1.1 mmol/L — ABNORMAL LOW (ref 1.15–1.40)
Creatinine, Ser: 1 mg/dL (ref 0.61–1.24)
Glucose, Bld: 112 mg/dL — ABNORMAL HIGH (ref 65–99)
HCT: 44 % (ref 39.0–52.0)
Hemoglobin: 15 g/dL (ref 13.0–17.0)
POTASSIUM: 3.8 mmol/L (ref 3.5–5.1)
SODIUM: 140 mmol/L (ref 135–145)
TCO2: 32 mmol/L (ref 22–32)

## 2016-10-18 LAB — COMPREHENSIVE METABOLIC PANEL
ALT: 15 U/L — ABNORMAL LOW (ref 17–63)
AST: 19 U/L (ref 15–41)
Albumin: 4.3 g/dL (ref 3.5–5.0)
Alkaline Phosphatase: 74 U/L (ref 38–126)
Anion gap: 9 (ref 5–15)
BUN: 12 mg/dL (ref 6–20)
CHLORIDE: 98 mmol/L — AB (ref 101–111)
CO2: 29 mmol/L (ref 22–32)
Calcium: 8.8 mg/dL — ABNORMAL LOW (ref 8.9–10.3)
Creatinine, Ser: 0.99 mg/dL (ref 0.61–1.24)
Glucose, Bld: 118 mg/dL — ABNORMAL HIGH (ref 65–99)
POTASSIUM: 3.7 mmol/L (ref 3.5–5.1)
Sodium: 136 mmol/L (ref 135–145)
Total Bilirubin: 0.6 mg/dL (ref 0.3–1.2)
Total Protein: 6.7 g/dL (ref 6.5–8.1)

## 2016-10-18 LAB — CBC
HEMATOCRIT: 44.2 % (ref 39.0–52.0)
HEMOGLOBIN: 14.5 g/dL (ref 13.0–17.0)
MCH: 31.2 pg (ref 26.0–34.0)
MCHC: 32.8 g/dL (ref 30.0–36.0)
MCV: 95.1 fL (ref 78.0–100.0)
Platelets: 262 10*3/uL (ref 150–400)
RBC: 4.65 MIL/uL (ref 4.22–5.81)
RDW: 13.4 % (ref 11.5–15.5)
WBC: 9.5 10*3/uL (ref 4.0–10.5)

## 2016-10-18 LAB — LIPASE, BLOOD: LIPASE: 33 U/L (ref 11–51)

## 2016-10-18 LAB — URINALYSIS, ROUTINE W REFLEX MICROSCOPIC
BILIRUBIN URINE: NEGATIVE
Glucose, UA: NEGATIVE mg/dL
HGB URINE DIPSTICK: NEGATIVE
Ketones, ur: NEGATIVE mg/dL
LEUKOCYTES UA: NEGATIVE
Nitrite: NEGATIVE
PH: 5 (ref 5.0–8.0)
Protein, ur: 100 mg/dL — AB
SPECIFIC GRAVITY, URINE: 1.028 (ref 1.005–1.030)

## 2016-10-18 LAB — I-STAT TROPONIN, ED
TROPONIN I, POC: 0.01 ng/mL (ref 0.00–0.08)
Troponin i, poc: 0.01 ng/mL (ref 0.00–0.08)

## 2016-10-18 MED ORDER — IPRATROPIUM-ALBUTEROL 0.5-2.5 (3) MG/3ML IN SOLN
3.0000 mL | RESPIRATORY_TRACT | Status: AC
  Administered 2016-10-18: 3 mL via RESPIRATORY_TRACT
  Filled 2016-10-18: qty 3

## 2016-10-18 MED ORDER — IOPAMIDOL (ISOVUE-300) INJECTION 61%
INTRAVENOUS | Status: AC
  Administered 2016-10-18: 100 mL
  Filled 2016-10-18: qty 100

## 2016-10-18 MED ORDER — ONDANSETRON HCL 4 MG/2ML IJ SOLN
4.0000 mg | Freq: Once | INTRAMUSCULAR | Status: AC
  Administered 2016-10-18: 4 mg via INTRAVENOUS
  Filled 2016-10-18: qty 2

## 2016-10-18 MED ORDER — ONDANSETRON 4 MG PO TBDP: 4.0000 mg | ORAL_TABLET | Freq: Three times a day (TID) | ORAL | 0 refills | Status: DC | PRN

## 2016-10-18 MED ORDER — MORPHINE SULFATE (PF) 4 MG/ML IV SOLN
2.0000 mg | Freq: Once | INTRAVENOUS | Status: AC
  Administered 2016-10-18: 2 mg via INTRAVENOUS
  Filled 2016-10-18: qty 1

## 2016-10-18 MED ORDER — PREDNISONE 20 MG PO TABS: ORAL_TABLET | ORAL | 0 refills | Status: DC

## 2016-10-18 MED ORDER — SODIUM CHLORIDE 0.9 % IV BOLUS (SEPSIS)
500.0000 mL | Freq: Once | INTRAVENOUS | Status: AC
  Administered 2016-10-18: 500 mL via INTRAVENOUS

## 2016-10-18 NOTE — ED Provider Notes (Signed)
[place] DEPT Provider Note   CSN: [id number] Arrival date & time: 10/18/16  1832     History   Chief Complaint Chief Complaint  Patient presents with  . Shortness of Breath    HPI Taylor [name] is a 61 y.o. male.  61 yo M with a chief complaint of shortness of breath. This is a chronic issue for him but worsening over the past couple days or so. He thinks the shortness of breath feels like his COPD however he has been having sweats and chills back and forth which is atypical. Has been coughing more often and having more sputum but denies change in sputum. Denies lower extremity edema. Has been having some epigastric abdominal pain as well. Nausea but denies vomiting. Sharp pains worse with movement ambulation. Mild worse with eating. He also was recently diagnosed with prostate cancer. Is awaiting radiation. He has been off of his inhalers disease because he is waiting for his disability paperwork to go through.   The history is provided by the patient.  Shortness of Breath  This is a chronic problem. The average episode lasts 1 week. The problem occurs continuously.The current episode started 2 days ago. The problem has been gradually worsening. Associated symptoms include a fever (subjective) and abdominal pain. Pertinent negatives include no headaches, no chest pain, no vomiting and no rash. He has tried ipratropium inhalers, leukotriene antagonists, beta-agonist inhalers and inhaled steroids for the symptoms. The treatment provided mild relief. He has had prior hospitalizations. He has had prior ED visits. Associated medical issues include COPD and chronic lung disease. Associated medical issues do not include asthma, heart failure or past MI.    Past Medical History:  Diagnosis Date  . Bipolar 1 disorder ([place]) 02/05/2012  . 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 SHERRILL  . Colon cancer ([place]) 04/11/2011  . COPD (chronic  obstructive pulmonary disease) ([place])    SMOKER  . Depression    Bipolar disorder  . Full dentures   . Hemorrhoids   . Inguinal hernia    RIGHT- PAINFUL  . Rib fractures    hx of  . Shortness of breath     Patient Active Problem List   Diagnosis Date Noted  . Acute bronchitis due to human metapneumovirus 01/16/2013  . Alcohol abuse 01/09/2013  . COPD exacerbation ([place]) 01/09/2013  . Chest pain 01/09/2013  . Acute respiratory failure with hypoxia ([place]) 01/09/2013  . Smoker 12/16/2012  . Inguinal hernia unilateral, non-recurrent, right 05/01/2012  . Dyspepsia 02/05/2012  . Bipolar 1 disorder ([place]) 02/05/2012  . Depression 02/05/2012  . Hyperglycemia 02/04/2012  . COPD  GOLD III 02/03/2012  . Thrombocytopenia ([place]) 02/03/2012  . 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 COLON SURGERY  04/11/11   Sigmoid colectomy  . COLOSTOMY REVISION  04/11/2011   Procedure: COLON RESECTION SIGMOID;  Surgeon: [name], MD;  Location: [facility];  Service: General;  Laterality: N/A;  low anterior colon resection   . INGUINAL HERNIA REPAIR Right 05/01/2012   Procedure: HERNIA REPAIR INGUINAL ADULT;  Surgeon: [name], MD;  Location: [facility];  Service: General;  Laterality: Right;  . INSERTION OF MESH Right 05/01/2012   Procedure: INSERTION OF MESH;  Surgeon: [name], MD;  Location: [facility];  Service: General;  Laterality: Right;  . PORT-A-CATH REMOVAL Left 05/01/2012   Procedure: REMOVAL Infusion Port;  Surgeon: Merlinda Frederick  Gerkin, MD;  Location: [facility];  Service: General;  Laterality: Left;  . PORTACATH PLACEMENT    . PORTACATH PLACEMENT  05/02/2011   Procedure: INSERTION PORT-A-CATH;  Surgeon: [name], MD;  Location: [facility];  Service: General;  Laterality: N/A;       Home Medications    Prior to Admission medications   Medication Sig Start Date End Date Taking? Authorizing Provider  albuterol (PROVENTIL HFA;VENTOLIN HFA) 108 (90  Base) MCG/ACT inhaler Inhale 1-2 puffs into the lungs every 6 (six) hours as needed for wheezing. 06/28/16   [name], MD  albuterol (PROVENTIL) (2.5 MG/3ML) 0.083% nebulizer solution Take 3 mLs (2.5 mg total) by nebulization every 6 (six) hours as needed for wheezing or shortness of breath. 06/28/16   [name], MD  ARIPiprazole (ABILIFY) 2 MG tablet Take 2 mg by mouth every morning.     [provider]  escitalopram (LEXAPRO) 20 MG tablet Take 20 mg by mouth every morning.     [provider]  fluticasone furoate-vilanterol (BREO ELLIPTA) 100-25 MCG/INH AEPB Inhale 1 puff into the lungs daily. 06/11/16   Juanito Doom, MD  lovastatin (MEVACOR) 20 MG tablet Take 20 mg by mouth at bedtime.    [provider]  mometasone-formoterol (DULERA) 200-5 MCG/ACT AERO INHALE 2 PUFFS INTO THE LUNGS FIRST THING IN THE MORNING THEN ANOTHER 2 PUFFS 12 HOURS LATER. 06/28/16   [name], MD  predniSONE (DELTASONE) 10 MG tablet 20mg  daily for 10 days, then 10mg  daily for 10 days Patient not taking: Reported on 07/01/2016 06/11/16   Juanito Doom, MD  Tiotropium Bromide Monohydrate (SPIRIVA RESPIMAT) 2.5 MCG/ACT AERS Inhale 2 puffs into the lungs daily. 06/28/16   [name], MD    Family History Family History  Problem Relation Age of Onset  . Heart disease Father   . Lung cancer Maternal Uncle        smoked    Social History Social History  Substance Use Topics  . Smoking status: Former Smoker    Packs/day: 0.50    Years: 38.00    Types: Cigarettes    Quit date: 02/11/2014  . Smokeless tobacco: Former Systems developer  . Alcohol use No     Comment: occasional weekends, 1 six pack or 2 six packs per weekend. Last drink  30 months ago.      Allergies   Codeine   Review of Systems Review of Systems  Constitutional: Positive for chills, diaphoresis and fever (subjective).  HENT: Negative for congestion and facial swelling.   Eyes: Negative for discharge and  visual disturbance.  Respiratory: Negative for shortness of breath.   Cardiovascular: Negative for chest pain and palpitations.  Gastrointestinal: Positive for abdominal pain and nausea. Negative for diarrhea and vomiting.  Musculoskeletal: Negative for arthralgias and myalgias.  Skin: Negative for color change and rash.  Neurological: Negative for tremors, syncope and headaches.  Psychiatric/Behavioral: Negative for confusion and dysphoric mood.     Physical Exam Updated Vital Signs BP 136/82 (BP Location: Left Arm)   Pulse 81   Temp 98.3 F (36.8 C) (Oral)   Resp 18   Ht 5\' 11"  (1.803 m)   Wt 76.2 kg (168 lb)   SpO2 94%   BMI 23.43 kg/m   Physical Exam  Constitutional: He is oriented to person, place, and time. He appears well-developed and well-nourished.  HENT:  Head: Normocephalic and atraumatic.  Eyes: Pupils are equal, round, and reactive to  light. EOM are normal.  Neck: Normal range of motion. Neck supple. No JVD present.  Cardiovascular: Normal rate and regular rhythm.  Exam reveals no gallop and no friction rub.   No murmur heard. Pulmonary/Chest: No respiratory distress. He has wheezes.  Diminished breath sounds in all fields, prolonged expiration  Abdominal: He exhibits distension (mild). He exhibits no mass. There is tenderness. There is guarding (RUQ). There is no rebound.  Musculoskeletal: Normal range of motion.  Neurological: He is alert and oriented to person, place, and time.  Skin: No rash noted. No pallor.  Psychiatric: He has a normal mood and affect. His behavior is normal.  Nursing note and vitals reviewed.    ED Treatments / Results  Labs (all labs ordered are listed, but only abnormal results are displayed) Labs Reviewed  LIPASE, BLOOD  COMPREHENSIVE METABOLIC PANEL  CBC  URINALYSIS, ROUTINE W REFLEX MICROSCOPIC  I-STAT TROPONIN, ED  I-STAT CHEM 8, ED    EKG  EKG Interpretation  Date/Time:  Friday October 18 2016 18:45:36  EDT Ventricular Rate:  82 PR Interval:  120 QRS Duration: 82 QT Interval:  362 QTC Calculation: 422 R Axis:   16 Text Interpretation:  Normal sinus rhythm Septal infarct , age undetermined Abnormal ECG No significant change since last tracing Confirmed by Deno Etienne [phone]) on 10/18/2016 7:20:07 PM       Radiology Dg Chest 2 View  Result Date: 10/18/2016 CLINICAL DATA:  Shortness of breath EXAM: CHEST  2 VIEW COMPARISON:  05/25/2016 FINDINGS: Hyperinflation. Emphysematous changes. No acute consolidation or pleural effusion. Normal cardiomediastinal silhouette. Multiple old right upper rib deformities. IMPRESSION: Hyperinflation. No radiographic evidence for acute cardiopulmonary abnormality. Electronically Signed   By: Donavan Foil M.D.   On: 10/18/2016 19:26    Procedures Procedures (including critical care time)  Medications Ordered in ED Medications  ipratropium-albuterol (DUONEB) 0.5-2.5 (3) MG/3ML nebulizer solution 3 mL (not administered)  morphine 4 MG/ML injection 2 mg (not administered)  ondansetron (ZOFRAN) injection 4 mg (not administered)  sodium chloride 0.9 % bolus 500 mL (not administered)     Initial Impression / Assessment and Plan / ED Course  I have reviewed the triage vital signs and the nursing notes.  Pertinent labs & imaging results that were available during my care of the patient were reviewed by me and considered in my medical decision making (see chart for details).     61 yo M With a chief complaint of shortness of breath and upper abdominal pain. On my exam patient has some guarding in the right upper quadrant. Negative Murphy sign but concerning with subjective fevers and chills at home. With his history of prostate cancer will obtain a CT scan of the abdomen and pelvis contrast. Give 3 DuoNebs back to back and reassess. Patient is feeling mildly better after getting albuterol and Solu-Medrol by EMS.  CT without acute finding.  Tolerating PO.  D/c  home.   :  I have discussed the diagnosis/risks/treatment options with the patient and believe the pt to be eligible for discharge home to follow-up with PCP. We also discussed returning to the ED immediately if new or worsening sx occur. We discussed the sx which are most concerning (e.g., sudden worsening pain, fever, inability to tolerate by mouth) that necessitate immediate return. Medications administered to the patient during their visit and any new prescriptions provided to the patient are listed below.  Medications given during this visit Medications  ipratropium-albuterol (DUONEB) 0.5-2.5 (3) MG/3ML nebulizer solution  3 mL (3 mLs Nebulization Not Given 10/18/16 2000)  morphine 4 MG/ML injection 2 mg (2 mg Intravenous Given 10/18/16 1951)  ondansetron (ZOFRAN) injection 4 mg (4 mg Intravenous Given 10/18/16 1951)  sodium chloride 0.9 % bolus 500 mL (0 mLs Intravenous Stopped 10/18/16 2211)  iopamidol (ISOVUE-300) 61 % injection (100 mLs  Contrast Given 10/18/16 2230)     The patient appears reasonably screen and/or stabilized for discharge and I doubt any other medical condition or other [facility] requiring further screening, evaluation, or treatment in the ED at this time prior to discharge.    Final Clinical Impressions(s) / ED Diagnoses   Final diagnoses:  None    New Prescriptions New Prescriptions   No medications on file     Deno Etienne, DO 10/19/16 1704

## 2016-10-18 NOTE — ED Notes (Signed)
Pt transported to CT ?

## 2016-10-18 NOTE — ED Notes (Signed)
Pt back from CT

## 2016-10-18 NOTE — ED Notes (Signed)
Nurse will collect labs. 

## 2016-10-18 NOTE — ED Triage Notes (Signed)
Per [facility], Pt is coming from home with complaints of Abdominal pain, sweats, and dizziness along with increased SOB x 4 days. Pt was given 5 mg albuterol and 125 mg solumedrol during transport. Hx of COPD and prostate cancer with no treatment at this time. Vitals per EMS: 200/100, 160/100 BP, 81 HR, 99% on Neb, 93% on RA. 20 R AC. Unremarkable 12 LEad.

## 2016-10-18 NOTE — Discharge Instructions (Signed)
Try zantac 150mg  twice a day.  Return for inability to eat or drink, sudden worsening pain, fever.

## 2016-10-22 ENCOUNTER — Inpatient Hospital Stay (HOSPITAL_COMMUNITY)
Admission: EM | Admit: 2016-10-22 | Discharge: 2016-10-24 | DRG: 192 | Disposition: A | Discharge status: Home / Self Care | Payer: Medicaid Other | Attending: Internal Medicine | Admitting: Internal Medicine

## 2016-10-22 ENCOUNTER — Encounter (HOSPITAL_COMMUNITY): Payer: Self-pay

## 2016-10-22 ENCOUNTER — Emergency Department (HOSPITAL_COMMUNITY): Payer: Medicaid Other

## 2016-10-22 DIAGNOSIS — Z79899 Other long term (current) drug therapy: Secondary | ICD-10-CM

## 2016-10-22 DIAGNOSIS — F17210 Nicotine dependence, cigarettes, uncomplicated: Secondary | ICD-10-CM | POA: Diagnosis present

## 2016-10-22 DIAGNOSIS — E099 Drug or chemical induced diabetes mellitus without complications: Secondary | ICD-10-CM | POA: Diagnosis present

## 2016-10-22 DIAGNOSIS — Z801 Family history of malignant neoplasm of trachea, bronchus and lung: Secondary | ICD-10-CM

## 2016-10-22 DIAGNOSIS — R0902 Hypoxemia: Secondary | ICD-10-CM | POA: Diagnosis present

## 2016-10-22 DIAGNOSIS — Z85038 Personal history of other malignant neoplasm of large intestine: Secondary | ICD-10-CM

## 2016-10-22 DIAGNOSIS — Z885 Allergy status to narcotic agent status: Secondary | ICD-10-CM

## 2016-10-22 DIAGNOSIS — J441 Chronic obstructive pulmonary disease with (acute) exacerbation: Principal | ICD-10-CM | POA: Diagnosis present

## 2016-10-22 DIAGNOSIS — Z66 Do not resuscitate: Secondary | ICD-10-CM | POA: Diagnosis present

## 2016-10-22 DIAGNOSIS — C61 Malignant neoplasm of prostate: Secondary | ICD-10-CM | POA: Diagnosis present

## 2016-10-22 DIAGNOSIS — X58XXXA Exposure to other specified factors, initial encounter: Secondary | ICD-10-CM | POA: Diagnosis not present

## 2016-10-22 DIAGNOSIS — T380X5A Adverse effect of glucocorticoids and synthetic analogues, initial encounter: Secondary | ICD-10-CM | POA: Diagnosis not present

## 2016-10-22 DIAGNOSIS — R739 Hyperglycemia, unspecified: Secondary | ICD-10-CM | POA: Diagnosis not present

## 2016-10-22 DIAGNOSIS — F319 Bipolar disorder, unspecified: Secondary | ICD-10-CM | POA: Diagnosis present

## 2016-10-22 DIAGNOSIS — F17200 Nicotine dependence, unspecified, uncomplicated: Secondary | ICD-10-CM | POA: Diagnosis present

## 2016-10-22 HISTORY — DX: Malignant neoplasm of prostate: C61

## 2016-10-22 LAB — CBC
HCT: 41.3 % (ref 39.0–52.0)
HEMOGLOBIN: 14.1 g/dL (ref 13.0–17.0)
MCH: 32.4 pg (ref 26.0–34.0)
MCHC: 34.1 g/dL (ref 30.0–36.0)
MCV: 94.9 fL (ref 78.0–100.0)
Platelets: 206 10*3/uL (ref 150–400)
RBC: 4.35 MIL/uL (ref 4.22–5.81)
RDW: 13.4 % (ref 11.5–15.5)
WBC: 7.6 10*3/uL (ref 4.0–10.5)

## 2016-10-22 LAB — BASIC METABOLIC PANEL
ANION GAP: 8 (ref 5–15)
BUN: 12 mg/dL (ref 6–20)
CALCIUM: 8.4 mg/dL — AB (ref 8.9–10.3)
CO2: 31 mmol/L (ref 22–32)
Chloride: 103 mmol/L (ref 101–111)
Creatinine, Ser: 0.81 mg/dL (ref 0.61–1.24)
GFR calc non Af Amer: >60 mL/min (ref >60)
Glucose, Bld: 130 mg/dL — ABNORMAL HIGH (ref 65–99)
Potassium: 3.5 mmol/L (ref 3.5–5.1)
Sodium: 142 mmol/L (ref 135–145)

## 2016-10-22 LAB — I-STAT TROPONIN, ED: TROPONIN I, POC: 0.01 ng/mL (ref 0.00–0.08)

## 2016-10-22 MED ORDER — METHYLPREDNISOLONE SODIUM SUCC 125 MG IJ SOLR
60.0000 mg | Freq: Two times a day (BID) | INTRAMUSCULAR | Status: DC
  Administered 2016-10-23 – 2016-10-24 (×4): 60 mg via INTRAVENOUS
  Filled 2016-10-22 (×4): qty 2

## 2016-10-22 MED ORDER — ESCITALOPRAM OXALATE 10 MG PO TABS
20.0000 mg | ORAL_TABLET | Freq: Every day | ORAL | Status: DC
  Administered 2016-10-23 – 2016-10-24 (×2): 20 mg via ORAL
  Filled 2016-10-22 (×2): qty 2

## 2016-10-22 MED ORDER — ACETAMINOPHEN 325 MG PO TABS: 650.0000 mg | ORAL_TABLET | Freq: Four times a day (QID) | ORAL | Status: DC | PRN

## 2016-10-22 MED ORDER — IPRATROPIUM-ALBUTEROL 0.5-2.5 (3) MG/3ML IN SOLN
3.0000 mL | Freq: Four times a day (QID) | RESPIRATORY_TRACT | Status: DC
  Administered 2016-10-22: 3 mL via RESPIRATORY_TRACT
  Filled 2016-10-22: qty 3

## 2016-10-22 MED ORDER — PRAVASTATIN SODIUM 20 MG PO TABS
20.0000 mg | ORAL_TABLET | Freq: Every day | ORAL | Status: DC
  Administered 2016-10-23: 20 mg via ORAL
  Filled 2016-10-22: qty 1

## 2016-10-22 MED ORDER — ARIPIPRAZOLE 2 MG PO TABS
2.0000 mg | ORAL_TABLET | Freq: Every day | ORAL | Status: DC
Start: 2016-10-23 — End: 2016-10-24
  Administered 2016-10-23 – 2016-10-24 (×2): 2 mg via ORAL
  Filled 2016-10-22 (×2): qty 1

## 2016-10-22 MED ORDER — DOCUSATE SODIUM 100 MG PO CAPS
100.0000 mg | ORAL_CAPSULE | Freq: Two times a day (BID) | ORAL | Status: DC
  Administered 2016-10-23 (×2): 100 mg via ORAL
  Filled 2016-10-22 (×2): qty 1

## 2016-10-22 MED ORDER — ONDANSETRON HCL 4 MG/2ML IJ SOLN: 4.0000 mg | Freq: Four times a day (QID) | INTRAMUSCULAR | Status: DC | PRN

## 2016-10-22 MED ORDER — IPRATROPIUM-ALBUTEROL 0.5-2.5 (3) MG/3ML IN SOLN
3.0000 mL | Freq: Three times a day (TID) | RESPIRATORY_TRACT | Status: DC
  Administered 2016-10-23 (×3): 3 mL via RESPIRATORY_TRACT
  Filled 2016-10-22 (×3): qty 3

## 2016-10-22 MED ORDER — DOXYCYCLINE HYCLATE 100 MG PO TABS
100.0000 mg | ORAL_TABLET | Freq: Two times a day (BID) | ORAL | Status: DC
  Administered 2016-10-23 – 2016-10-24 (×4): 100 mg via ORAL
  Filled 2016-10-22 (×4): qty 1

## 2016-10-22 MED ORDER — MOMETASONE FURO-FORMOTEROL FUM 200-5 MCG/ACT IN AERO
2.0000 | INHALATION_SPRAY | Freq: Two times a day (BID) | RESPIRATORY_TRACT | Status: DC
  Administered 2016-10-23 (×2): 2 via RESPIRATORY_TRACT
  Filled 2016-10-22: qty 8.8

## 2016-10-22 MED ORDER — NICOTINE 14 MG/24HR TD PT24
14.0000 mg | MEDICATED_PATCH | Freq: Every day | TRANSDERMAL | Status: DC
  Administered 2016-10-23 – 2016-10-24 (×2): 14 mg via TRANSDERMAL
  Filled 2016-10-22 (×2): qty 1

## 2016-10-22 MED ORDER — ACETAMINOPHEN 650 MG RE SUPP: 650.0000 mg | Freq: Four times a day (QID) | RECTAL | Status: DC | PRN

## 2016-10-22 MED ORDER — ONDANSETRON HCL 4 MG PO TABS: 4.0000 mg | ORAL_TABLET | Freq: Four times a day (QID) | ORAL | Status: DC | PRN

## 2016-10-22 MED ORDER — ENOXAPARIN SODIUM 40 MG/0.4ML ~~LOC~~ SOLN
40.0000 mg | Freq: Every day | SUBCUTANEOUS | Status: DC
  Administered 2016-10-23 (×2): 40 mg via SUBCUTANEOUS
  Filled 2016-10-22 (×2): qty 0.4

## 2016-10-22 MED ORDER — ALBUTEROL SULFATE (2.5 MG/3ML) 0.083% IN NEBU: 2.5000 mg | INHALATION_SOLUTION | RESPIRATORY_TRACT | Status: DC | PRN

## 2016-10-22 NOTE — ED Provider Notes (Signed)
[place] DEPT Provider Note   CSN: [id number] Arrival date & time: 10/22/16  1749     History   Chief Complaint Chief Complaint  Patient presents with  . Shortness of Breath  . Chest Pain    HPI Taylor [name] is a 61 y.o. male presenting with shortness of breath.  Patient states that for the past 2 days he's had worsening shortness of breath. He has a history of COPD, but his disability was discontinued, and he only has his albuterol inhaler. He is out of his other medications. For the past 2 days shortness of breath has been persistent, worsening when he walks even short distances. He also reports chest tightness and heaviness with exertion. Patient reports associated productive cough. He denies fevers, chills, congestion, sore throat, nausea, vomiting, or abdominal pain. He was seen in the emergency room recently for abdominal pain, but his abd symptoms have improved. He denies leg pain or swelling. Denies recent surgery or immobilization, travel, or hormone use. He denies cardiac history. He smokes a half a pack a day, denies alcohol or drug use. He states that he follows with Dr. Melvyn Novas with pulmonology, but has not been recently due to his disability. He has diagnoses of prostate cancer, but is not currently getting treatment. He is not on oxygen at home.   In route, EMS gave 10 mg albuterol, 0.5 mg Atrovent, 2 mg mag, 125 Solu-Medrol. Patient reports improved symptoms with this tx.   HPI  Past Medical History:  Diagnosis Date  . Bipolar 1 disorder ([place]) 02/05/2012  . 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 SHERRILL  . Colon cancer ([place]) 04/11/2011  . COPD (chronic obstructive pulmonary disease) ([place])    SMOKER  . Depression    Bipolar disorder  . Full dentures   . Hemorrhoids   . Inguinal hernia    RIGHT- PAINFUL  . Rib fractures    hx of  . Shortness of breath     Patient Active Problem List   Diagnosis Date Noted    . Acute bronchitis due to human metapneumovirus 01/16/2013  . Alcohol abuse 01/09/2013  . COPD exacerbation ([place]) 01/09/2013  . Chest pain 01/09/2013  . Acute respiratory failure with hypoxia ([place]) 01/09/2013  . Smoker 12/16/2012  . Inguinal hernia unilateral, non-recurrent, right 05/01/2012  . Dyspepsia 02/05/2012  . Bipolar 1 disorder ([place]) 02/05/2012  . Depression 02/05/2012  . Hyperglycemia 02/04/2012  . COPD  GOLD III 02/03/2012  . Thrombocytopenia ([place]) 02/03/2012  . 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 COLON SURGERY  04/11/11   Sigmoid colectomy  . COLOSTOMY REVISION  04/11/2011   Procedure: COLON RESECTION SIGMOID;  Surgeon: [name], MD;  [facility];  Service: General;  Laterality: N/A;  low anterior colon resection   . INGUINAL HERNIA REPAIR Right 05/01/2012   Procedure: HERNIA REPAIR INGUINAL ADULT;  Surgeon: [name], MD;  [facility];  Service: General;  Laterality: Right;  . INSERTION OF MESH Right 05/01/2012   Procedure: INSERTION OF MESH;  Surgeon: [name], MD;  [facility];  Service: General;  Laterality: Right;  . PORT-A-CATH REMOVAL Left 05/01/2012   Procedure: REMOVAL Infusion Port;  Surgeon: [name], MD;  [facility];  Service: General;  Laterality: Left;  . PORTACATH PLACEMENT    . PORTACATH PLACEMENT  05/02/2011   Procedure: INSERTION PORT-A-CATH;  Surgeon: Sherren Mocha  Leeanne Mannan, MD;  [facility];  Service: General;  Laterality: N/A;       Home Medications    Prior to Admission medications   Medication Sig Start Date End Date Taking? Authorizing Provider  albuterol (PROVENTIL HFA;VENTOLIN HFA) 108 (90 Base) MCG/ACT inhaler Inhale 1-2 puffs into the lungs every 6 (six) hours as needed for wheezing. 06/28/16  Yes [name], MD  albuterol (PROVENTIL) (2.5 MG/3ML) 0.083% nebulizer solution Take 3 mLs (2.5 mg total) by nebulization every 6 (six) hours as needed for  wheezing or shortness of breath. 06/28/16  Yes [name], MD  ARIPiprazole (ABILIFY) 2 MG tablet Take 2 mg by mouth every morning.    Yes [provider]  escitalopram (LEXAPRO) 20 MG tablet Take 20 mg by mouth every morning.    Yes [provider]  lovastatin (MEVACOR) 20 MG tablet Take 20 mg by mouth at bedtime.   Yes [provider]  mometasone-formoterol (DULERA) 200-5 MCG/ACT AERO INHALE 2 PUFFS INTO THE LUNGS FIRST THING IN THE MORNING THEN ANOTHER 2 PUFFS 12 HOURS LATER. 06/28/16  Yes [name], MD  predniSONE (DELTASONE) 20 MG tablet Take 20 mg by mouth daily as needed (sob).   Yes [provider]  Tiotropium Bromide Monohydrate (SPIRIVA RESPIMAT) 2.5 MCG/ACT AERS Inhale 2 puffs into the lungs daily. 06/28/16  Yes [name], MD  ondansetron (ZOFRAN ODT) 4 MG disintegrating tablet Take 1 tablet (4 mg total) by mouth every 8 (eight) hours as needed for nausea or vomiting. 10/18/16   Deno Etienne, DO  predniSONE (DELTASONE) 20 MG tablet 2 tabs po daily x 4 days Patient not taking: Reported on 10/22/2016 10/18/16   Deno Etienne, DO    Family History Family History  Problem Relation Age of Onset  . Heart disease Father   . Lung cancer Maternal Uncle        smoked    Social History Social History  Substance Use Topics  . Smoking status: Former Smoker    Packs/day: 0.50    Years: 38.00    Types: Cigarettes    Quit date: 02/11/2014  . Smokeless tobacco: Former Systems developer  . Alcohol use No     Comment: occasional weekends, 1 six pack or 2 six packs per weekend. Last drink  30 months ago.      Allergies   Codeine   Review of Systems Review of Systems  Constitutional: Positive for diaphoresis.  Respiratory: Positive for cough, chest tightness and shortness of breath.   All other systems reviewed and are negative.    Physical Exam Updated Vital Signs BP 131/84 (BP Location: Left Arm)   Pulse 93   Temp 98.8 F (37.1 C) (Oral)   Resp (!)  22   Ht 5\' 11"  (1.803 m)   Wt 76.2 kg (168 lb)   SpO2 92%   BMI 23.43 kg/m   Physical Exam  Constitutional: He is oriented to person, place, and time. He appears well-developed and well-nourished. No distress.  HENT:  Head: Normocephalic and atraumatic.  Mouth/Throat: Uvula is midline, oropharynx is clear and moist and mucous membranes are normal.  Eyes: Pupils are equal, round, and reactive to light. Conjunctivae and EOM are normal.  Neck: Normal range of motion.  Cardiovascular: Normal rate, regular rhythm and intact distal pulses.   Pulmonary/Chest: Effort normal. No respiratory distress. He has wheezes.  Speaking in full sentences without difficulty. Very distant lung sounds throughout. Scattered wheezes.  Abdominal: Soft.  He exhibits no distension. There is no tenderness. There is no rebound and no guarding.  Musculoskeletal: Normal range of motion.  Neurological: He is alert and oriented to person, place, and time.  Skin: Skin is warm and dry.  Psychiatric: He has a normal mood and affect.  Nursing note and vitals reviewed.    ED Treatments / Results  Labs (all labs ordered are listed, but only abnormal results are displayed) Labs Reviewed  BASIC METABOLIC PANEL - Abnormal; Notable for the following:       Result Value   Glucose, Bld 130 (*)    Calcium 8.4 (*)    All other components within normal limits  CBC  I-STAT TROPONIN, ED    EKG  EKG Interpretation  Date/Time:  Tuesday October 22 2016 18:08:07 EDT Ventricular Rate:  92 PR Interval:    QRS Duration: 85 QT Interval:  359 QTC Calculation: 445 R Axis:   -53 Text Interpretation:  Sinus rhythm Left anterior fascicular block Consider anterior infarct No significant change since last tracing Confirmed by Blanchie Dessert ([id number]) on 10/22/2016 6:48:15 PM       Radiology Dg Chest 2 View  Result Date: 10/22/2016 CLINICAL DATA:  Shortness of breath with productive cough 2 days. COPD exacerbation. EXAM: CHEST   2 VIEW COMPARISON:  10/18/2016 FINDINGS: Lungs are hyperexpanded without focal consolidation or effusion. Mild flattening of the hemidiaphragms on the lateral film. Mild increased lucency of the mid to upper lungs. Cardiomediastinal silhouette is within normal. Minimal degenerate change of the spine. Multiple old right rib fractures. IMPRESSION: No acute cardiopulmonary disease. Emphysema (ICD10-J43.9). Electronically Signed   By: Marin Olp M.D.   On: 10/22/2016 19:28    Procedures Procedures (including critical care time)  Medications Ordered in ED Medications - No data to display   Initial Impression / Assessment and Plan / ED Course  I have reviewed the triage vital signs and the nursing notes.  Pertinent labs & imaging results that were available during my care of the patient were reviewed by me and considered in my medical decision making (see chart for details).     Patient presenting with worsening shortness of breath and chest pressure with exertion. He states he is not taking his COPD medications, as he does not have disability and does not have his normal medications. He only has albuterol. He has had a productive cough. Physical exam shows very distant lung sounds with scattered wheezing. Patient speaking in full sentences. Satting at 92 on room air. Will order basic labs, troponin, EKG, and chest x-ray. Patient low risk per Wells criteria, will not order d-dimer or CTA at this time, as I doubt PE. Much more likely to be COPD exacerbation. Case discussed with attending, Dr. Maryan Rued evaluated the patient.  Labs reassuring, no leukocytosis. Troponin negative. Chest x-ray negative for infiltrate or infection. EKG reassuring. Will ambulate patient with pulse ox.  On ambulation, O2 sats dropped to 89%, and patient reports return of chest tightness and worsening shortness of breath. He states it took him several minutes until he could catch his breath and return to baseline. Concern  for COPD exacerbation with increased oxygen demand. Will contact hospitalist for admission.  Case discussed with Dr. Lorin Mercy, patient be admitted to hospitalist service.  Final Clinical Impressions(s) / ED Diagnoses   Final diagnoses:  COPD exacerbation [facility])    New Prescriptions New Prescriptions   No medications on file     Franchot Heidelberg, PA-C 10/22/16 2242  Blanchie Dessert, MD 10/23/16 2114

## 2016-10-22 NOTE — ED Notes (Signed)
EDPA Provider at bedside. 

## 2016-10-22 NOTE — ED Triage Notes (Signed)
Per [facility] pt from home c/o COPD exacerbation for past 2 days. Initially tight lung sounds, gave 10 mg albuterol, 0.5mg  Atrovent, 2G mag sulfate, 260CC NS. Pt feels better. Wheezing still noted but sounds better. Pt 93% on RA now.  20G L forearm.

## 2016-10-22 NOTE — ED Notes (Signed)
Bed: WA10 Expected date:  Expected time:  Means of arrival:  Comments: EMS-COPD 

## 2016-10-22 NOTE — H&P (Signed)
History and Physical    Taylor [name] WVP:[id number] DOB: Oct 25, 1955 DOA: 10/22/2016  PCP: None Consultants:  Melvyn Novas - pulmonology Patient coming from:  Home - lives in [place] (a [facility]); NOK: sister, Jackelyn Poling [place]  Chief Complaint: SOB  HPI: Taylor [name] is a 61 y.o. male with medical history significant of colon cancer; current prostate cancer; COPD with ongoing tobacco dependence; and bipolar d/o presenting with c/o substernal chest pressure and SOB.  He would walk a little ways and then have to put his head in front of a fan or window to try to catch his breath.  It would happen anytime he started moving.  +cough, productive of greenish phlegm.  This has been going on for a couple of days.  He was spitting up phlegm onto his pillow during sleep.  No fevers.  No sick contacts.  Released from prison after 30 months + 90 days, released Sept 16.  He was in prison for failing to register as a sex offender.  He lost disability while he was in prison - had disability through mental health and it is still pending now.  He has an appt for evaluation on Oct 9.  ED Course:  O2 sats to 89% with ambulation, negative CXR, COPD exacerbation with increased O2 demand  Review of Systems: As per HPI; otherwise review of systems reviewed and negative.   Ambulatory Status:  Ambulates without assistance  Past Medical History:  Diagnosis Date  . Bipolar 1 disorder ([place]) 02/05/2012  . Cancer ([place]) 04/11/11   adenocarcinoma of colon, 7/19 nodes pos.FINISHED CHEMO/DR. SHERRILL  . COPD (chronic obstructive pulmonary disease) ([place])    SMOKER  . Depression    Bipolar disorder  . Full dentures   . Hemorrhoids   . Inguinal hernia    RIGHT- PAINFUL  . Prostate cancer ([place])    Gleason score = 7, supposed to have radiation therapy but he has not followed up  . Rib fractures    hx of  . Shortness of breath     Past Surgical History:  Procedure Laterality Date  . COLON  SURGERY  04/11/11   Sigmoid colectomy  . COLOSTOMY REVISION  04/11/2011   Procedure: COLON RESECTION SIGMOID;  Surgeon: [name], MD;  Location: [facility];  Service: General;  Laterality: N/A;  low anterior colon resection   . INGUINAL HERNIA REPAIR Right 05/01/2012   Procedure: HERNIA REPAIR INGUINAL ADULT;  Surgeon: [name], MD;  Location: [facility];  Service: General;  Laterality: Right;  . INSERTION OF MESH Right 05/01/2012   Procedure: INSERTION OF MESH;  Surgeon: [name], MD;  Location: [facility];  Service: General;  Laterality: Right;  . PORT-A-CATH REMOVAL Left 05/01/2012   Procedure: REMOVAL Infusion Port;  Surgeon: [name], MD;  Location: [facility];  Service: General;  Laterality: Left;  . PORTACATH PLACEMENT    . PORTACATH PLACEMENT  05/02/2011   Procedure: INSERTION PORT-A-CATH;  Surgeon: [name], MD;  Location: [facility];  Service: General;  Laterality: N/A;    Social History   Social History  . Marital status: Divorced    Spouse name: N/A  . Number of children: N/A  . Years of education: N/A   Occupational History  . disability pending    Social History Main Topics  . Smoking status: Current Every Day Smoker    Packs/day: 0.50    Years: 45.00    Types: Cigarettes    Last  attempt to quit: 02/11/2014  . Smokeless tobacco: Former Systems developer  . Alcohol use No     Comment: h/o use in the past, no h/o heavy use  . Drug use: No  . Sexual activity: No   Other Topics Concern  . Not on file   Social History Narrative   Lives alone-divorced, disabled, in [place] [address]'s   Brother, Taiwo Fish and his wife Jackelyn Poling assist   Prior occupation: Clinical biochemist    Allergies  Allergen Reactions  . Codeine Nausea And Vomiting    Family History  Problem Relation Age of Onset  . Heart disease Father   . Lung cancer Maternal Uncle        smoked    Prior to Admission medications   Medication Sig Start Date End Date Taking? Authorizing Provider  albuterol (PROVENTIL  HFA;VENTOLIN HFA) 108 (90 Base) MCG/ACT inhaler Inhale 1-2 puffs into the lungs every 6 (six) hours as needed for wheezing. 06/28/16  Yes [name], MD  albuterol (PROVENTIL) (2.5 MG/3ML) 0.083% nebulizer solution Take 3 mLs (2.5 mg total) by nebulization every 6 (six) hours as needed for wheezing or shortness of breath. 06/28/16  Yes [name], MD  ARIPiprazole (ABILIFY) 2 MG tablet Take 2 mg by mouth every morning.    Yes [provider]  escitalopram (LEXAPRO) 20 MG tablet Take 20 mg by mouth every morning.    Yes [provider]  lovastatin (MEVACOR) 20 MG tablet Take 20 mg by mouth at bedtime.   Yes [provider]  mometasone-formoterol (DULERA) 200-5 MCG/ACT AERO INHALE 2 PUFFS INTO THE LUNGS FIRST THING IN THE MORNING THEN ANOTHER 2 PUFFS 12 HOURS LATER. 06/28/16  Yes [name], MD  predniSONE (DELTASONE) 20 MG tablet Take 20 mg by mouth daily as needed (sob).   Yes [provider]  Tiotropium Bromide Monohydrate (SPIRIVA RESPIMAT) 2.5 MCG/ACT AERS Inhale 2 puffs into the lungs daily. 06/28/16  Yes [name], MD  ondansetron (ZOFRAN ODT) 4 MG disintegrating tablet Take 1 tablet (4 mg total) by mouth every 8 (eight) hours as needed for nausea or vomiting. 10/18/16   Deno Etienne, DO  predniSONE (DELTASONE) 20 MG tablet 2 tabs po daily x 4 days Patient not taking: Reported on 10/22/2016 10/18/16   Deno Etienne, DO    Physical Exam: Vitals:   10/22/16 2115 10/22/16 2130 10/22/16 2145 10/22/16 2200  BP:  134/85  131/81  Pulse: 78 76 72 71  Resp: 17 (!) 23 20 (!) 22  Temp:  98.6 F (37 C)    TempSrc:  Oral    SpO2: 94% 95% 93% 94%  Weight:      Height:         General:  Appears calm and comfortable and is NAD on RA Eyes:  PERRL, EOMI, normal lids, iris ENT:  grossly normal hearing, lips & tongue, mmm; appropriate dentition Neck:  no LAD, masses or thyromegaly; no carotid bruits Cardiovascular:  RRR, no m/r/g. No LE edema.  Respiratory:    CTA bilaterally with scattered wheezes, reasonable air movement.  Normal respiratory effort. Abdomen:  soft, NT, ND, NABS Back:   normal alignment, no CVAT Skin:  no rash or induration seen on limited exam Musculoskeletal:  grossly normal tone BUE/BLE, good ROM, no bony abnormality Psychiatric:  grossly normal mood and affect, speech fluent and appropriate, AOx3 Neurologic:  CN 2-12 grossly intact, moves all extremities in coordinated fashion, sensation intact    Radiological Exams on  Admission: Dg Chest 2 View  Result Date: 10/22/2016 CLINICAL DATA:  Shortness of breath with productive cough 2 days. COPD exacerbation. EXAM: CHEST  2 VIEW COMPARISON:  10/18/2016 FINDINGS: Lungs are hyperexpanded without focal consolidation or effusion. Mild flattening of the hemidiaphragms on the lateral film. Mild increased lucency of the mid to upper lungs. Cardiomediastinal silhouette is within normal. Minimal degenerate change of the spine. Multiple old right rib fractures. IMPRESSION: No acute cardiopulmonary disease. Emphysema (ICD10-J43.9). Electronically Signed   By: Marin Olp M.D.   On: 10/22/2016 19:28    EKG: Independently reviewed.  NSR with rate 92; nonspecific ST changes with no evidence of acute ischemia, NSCSLT   Labs on Admission: I have personally reviewed the available labs and imaging studies at the time of the admission.  Pertinent labs:   Glucose 130 Troponin 0.01 WBC 7.6  Assessment/Plan Principal Problem:   COPD with acute exacerbation (HCC) Active Problems:   Hyperglycemia   Bipolar 1 disorder (HCC)   Smoker   COPD exacerbation -Patient's shortness of breath and productive cough are most likely caused by acute COPD exacerbation.  -Although patient has a tightening across his chest with coughing and a productive cough, he does not have fever or leukocytosis. Chest x-ray is not consistent with pneumonia -Negative troponin x 1; low suspicion for ACS but will r/o with  troponin x 3 and repeat EKG in the AM -will observe patient - despite mild hypoxia with ambulation, he appears to be fairly comfortable  -Nebulizers: scheduled Duoneb and prn albuterol -Solu-Medrol 60 mg IV BID  -Oral doxycycline (has 3/3 cardinal symptoms)   Hyperglycemia -May be stress response -Will follow with fasting AM labs  Bipolar -Continue Abilify and Lexapro  Tobacco dependence -Encourage cessation.  This was discussed with the patient and should be reviewed on an ongoing basis.  -Patch ordered at patient request.   DVT prophylaxis:  Lovenox  Code Status: DNR - confirmed with patient Family Communication: None present Disposition Plan:  Back to [facility] once clinically improved Consults called: PT/OT/CM/Nutrition/RT/SW Admission status: It is my clinical opinion that referral for OBSERVATION is reasonable and necessary in this patient based on the above information provided. The aforementioned taken together are felt to place the patient at high risk for further clinical deterioration. However it is anticipated that the patient may be medically stable for discharge from the hospital within 24 to 48 hours.    Karmen Bongo MD [facility]  If note is complete, please contact covering daytime or nighttime physician. [email or web address] Password [email or web address]  10/22/2016, 10:51 PM

## 2016-10-22 NOTE — ED Notes (Signed)
Patient transported to X-ray 

## 2016-10-22 NOTE — ED Notes (Signed)
Pt was ambulating around the nurse desk and back to the pt room O2  93%start and 89% when return back to pt room

## 2016-10-22 NOTE — ED Notes (Addendum)
SOLUMEDROL 125MG  IVP GIVEN BY [facility] IN ROUTE.

## 2016-10-23 DIAGNOSIS — X58XXXA Exposure to other specified factors, initial encounter: Secondary | ICD-10-CM | POA: Diagnosis not present

## 2016-10-23 DIAGNOSIS — Z85038 Personal history of other malignant neoplasm of large intestine: Secondary | ICD-10-CM | POA: Diagnosis not present

## 2016-10-23 DIAGNOSIS — J441 Chronic obstructive pulmonary disease with (acute) exacerbation: Secondary | ICD-10-CM | POA: Diagnosis present

## 2016-10-23 DIAGNOSIS — C61 Malignant neoplasm of prostate: Secondary | ICD-10-CM | POA: Diagnosis present

## 2016-10-23 DIAGNOSIS — F319 Bipolar disorder, unspecified: Secondary | ICD-10-CM

## 2016-10-23 DIAGNOSIS — R0902 Hypoxemia: Secondary | ICD-10-CM | POA: Diagnosis present

## 2016-10-23 DIAGNOSIS — F17200 Nicotine dependence, unspecified, uncomplicated: Secondary | ICD-10-CM

## 2016-10-23 DIAGNOSIS — Z66 Do not resuscitate: Secondary | ICD-10-CM | POA: Diagnosis present

## 2016-10-23 DIAGNOSIS — Z801 Family history of malignant neoplasm of trachea, bronchus and lung: Secondary | ICD-10-CM | POA: Diagnosis not present

## 2016-10-23 DIAGNOSIS — F17210 Nicotine dependence, cigarettes, uncomplicated: Secondary | ICD-10-CM | POA: Diagnosis present

## 2016-10-23 DIAGNOSIS — T380X5A Adverse effect of glucocorticoids and synthetic analogues, initial encounter: Secondary | ICD-10-CM | POA: Diagnosis not present

## 2016-10-23 DIAGNOSIS — Z885 Allergy status to narcotic agent status: Secondary | ICD-10-CM | POA: Diagnosis not present

## 2016-10-23 DIAGNOSIS — Z79899 Other long term (current) drug therapy: Secondary | ICD-10-CM | POA: Diagnosis not present

## 2016-10-23 DIAGNOSIS — R739 Hyperglycemia, unspecified: Secondary | ICD-10-CM

## 2016-10-23 LAB — CBC
HCT: 40.8 % (ref 39.0–52.0)
Hemoglobin: 13.9 g/dL (ref 13.0–17.0)
MCH: 31.5 pg (ref 26.0–34.0)
MCHC: 34.1 g/dL (ref 30.0–36.0)
MCV: 92.5 fL (ref 78.0–100.0)
PLATELETS: 223 10*3/uL (ref 150–400)
RBC: 4.41 MIL/uL (ref 4.22–5.81)
RDW: 13.4 % (ref 11.5–15.5)
WBC: 8.4 10*3/uL (ref 4.0–10.5)

## 2016-10-23 LAB — BASIC METABOLIC PANEL
Anion gap: 8 (ref 5–15)
BUN: 14 mg/dL (ref 6–20)
CALCIUM: 8.5 mg/dL — AB (ref 8.9–10.3)
CHLORIDE: 101 mmol/L (ref 101–111)
CO2: 29 mmol/L (ref 22–32)
CREATININE: 0.82 mg/dL (ref 0.61–1.24)
GFR calc Af Amer: >60 mL/min (ref >60)
GFR calc non Af Amer: >60 mL/min (ref >60)
Glucose, Bld: 184 mg/dL — ABNORMAL HIGH (ref 65–99)
POTASSIUM: 3.9 mmol/L (ref 3.5–5.1)
Sodium: 138 mmol/L (ref 135–145)

## 2016-10-23 LAB — TROPONIN I
Troponin I: <0.03 ng/mL (ref <0.03)
Troponin I: <0.03 ng/mL (ref <0.03)

## 2016-10-23 LAB — HIV ANTIBODY (ROUTINE TESTING W REFLEX): HIV SCREEN 4TH GENERATION: NONREACTIVE

## 2016-10-23 MED ORDER — SORBITOL 70 % SOLN
30.0000 mL | Freq: Once | Status: AC
  Administered 2016-10-23: 30 mL via ORAL
  Filled 2016-10-23: qty 30

## 2016-10-23 MED ORDER — POLYETHYLENE GLYCOL 3350 17 G PO PACK
17.0000 g | PACK | Freq: Two times a day (BID) | ORAL | Status: DC
  Administered 2016-10-23: 17 g via ORAL
  Filled 2016-10-23 (×3): qty 1

## 2016-10-23 MED ORDER — SENNOSIDES-DOCUSATE SODIUM 8.6-50 MG PO TABS
1.0000 | ORAL_TABLET | Freq: Two times a day (BID) | ORAL | Status: DC
  Administered 2016-10-23: 1 via ORAL
  Filled 2016-10-23 (×2): qty 1

## 2016-10-23 MED ORDER — PREMIER PROTEIN SHAKE
11.0000 [oz_av] | Freq: Two times a day (BID) | ORAL | Status: DC
  Administered 2016-10-23: 11 [oz_av] via ORAL
  Filled 2016-10-23 (×4): qty 325.31

## 2016-10-23 NOTE — Progress Notes (Signed)
PROGRESS NOTE    Taylor [name]  NGE:[id number] DOB: 1955/05/21 DOA: 10/22/2016 PCP: System, Provider Not In    Brief Narrative: Taylor [name] is a 61 y.o. male with medical history significant of colon cancer; current prostate cancer; COPD with ongoing tobacco dependence; and bipolar d/o presenting with c/o substernal chest pressure and SOB.  He would walk a little ways and then have to put his head in front of a fan or window to try to catch his breath.  It would happen anytime he started moving.  +cough, productive of greenish phlegm.  This has been going on for a couple of days.  He was spitting up phlegm onto his pillow during sleep.  No fevers.  No sick contacts.  Released from prison after 30 months + 90 days, released Sept 16.  He was in prison for failing to register as a sex offender.  He lost disability while he was in prison - had disability through mental health and it is still pending now.  He has an appt for evaluation on Oct 9.  ED Course:  O2 sats to 89% with ambulation, negative CXR, COPD exacerbation with increased O2 demand   Assessment & Plan:   Principal Problem:   COPD with acute exacerbation ([place]) Active Problems:   Hyperglycemia   Bipolar 1 disorder (HCC)   Smoker  #1 acute COPD exacerbation Patient presenting with shortness of breath, productive cough of greenish sputum, ongoing tobacco abuse, wheezing noted to be hypoxic on ambulation on admission. Patient currently with some clinical improvement however not at baseline. Continue doxycycline, IV Solu-Medrol, dulera. Will place on scheduled DuoNeb nebs. Follow. Check oxygen saturations on room air and on ambulation in the morning.  #2 bipolar disorder Stable. Continue Abilify, Lexapro.  #3 tobacco abuse Tobacco cessation. Continue nicotine patch.  #4 hyperglycemia Likely to be elevated on steroids. Follow.   DVT prophylaxis: Lovenox Code Status: DO NOT RESUSCITATE Family Communication:  Updated patient. No family at bedside. Disposition Plan: Likely home tomorrow if continued improvement and not hypoxic.   Consultants:   None  Procedures:   Chest x-ray 10/22/2016  Antimicrobials:   Oral doxycycline 10/22/2016     Subjective: Patient states some improvement with shortness of breath and wheezing since admission. No chest pain. Patient however states not at baseline.  Objective: Vitals:   10/22/16 2326 10/22/16 2336 10/23/16 0533 10/23/16 1139  BP: (!) 142/89  118/84   Pulse: 73  71   Resp: 20  18   Temp: 98 F (36.7 C)  98 F (36.7 C)   TempSrc: Oral  Oral   SpO2: 94% 94% 96% 91%  Weight: 75.5 kg (166 lb 7.2 oz)     Height: 5\' 11"  (1.803 m)       Intake/Output Summary (Last 24 hours) at 10/23/16 1335 Last data filed at 10/23/16 0900  Gross per 24 hour  Intake              960 ml  Output              400 ml  Net              560 ml   Filed Weights   10/22/16 1804 10/22/16 2326  Weight: 76.2 kg (168 lb) 75.5 kg (166 lb 7.2 oz)    Examination:  General exam: Appears calm and comfortable  Respiratory system: Some minimal expiratory wheezing. Some tightness. Respiratory effort normal. Cardiovascular system: S1 & S2 heard, RRR. No  JVD, murmurs, rubs, gallops or clicks. No pedal edema. Gastrointestinal system: Abdomen is nondistended, soft and nontender. No organomegaly or masses felt. Normal bowel sounds heard. Central nervous system: Alert and oriented. No focal neurological deficits. Extremities: Symmetric 5 x 5 power. Skin: No rashes, lesions or ulcers Psychiatry: Judgement and insight appear normal. Mood & affect appropriate.     Data Reviewed: I have personally reviewed following labs and imaging studies  CBC:  Recent Labs Lab 10/18/16 1852 10/18/16 2017 10/22/16 1830 10/23/16 0745  WBC 9.5  --  7.6 8.4  HGB 14.5 15.0 14.1 13.9  HCT 44.2 44.0 41.3 40.8  MCV 95.1  --  94.9 92.5  PLT 262  --  206 151   Basic Metabolic  Panel:  Recent Labs Lab 10/18/16 1852 10/18/16 2017 10/22/16 1830 10/23/16 0745  NA 136 140 142 138  K 3.7 3.8 3.5 3.9  CL 98* 97* 103 101  CO2 29  --  31 29  GLUCOSE 118* 112* 130* 184*  BUN 12 16 12 14   CREATININE 0.99 1.00 0.81 0.82  CALCIUM 8.8*  --  8.4* 8.5*   GFR: Estimated Creatinine Clearance: 100.8 mL/min (by C-G formula based on SCr of 0.82 mg/dL). Liver Function Tests:  Recent Labs Lab 10/18/16 1852  AST 19  ALT 15*  ALKPHOS 74  BILITOT 0.6  PROT 6.7  ALBUMIN 4.3    Recent Labs Lab 10/18/16 1852  LIPASE 33   No results for input(s): AMMONIA in the last 168 hours. Coagulation Profile: No results for input(s): INR, PROTIME in the last 168 hours. Cardiac Enzymes:  Recent Labs Lab 10/22/16 2347 10/23/16 0745  TROPONINI <0.03 <0.03   BNP (last 3 results) No results for input(s): PROBNP in the last 8760 hours. HbA1C: No results for input(s): HGBA1C in the last 72 hours. CBG: No results for input(s): GLUCAP in the last 168 hours. Lipid Profile: No results for input(s): CHOL, HDL, LDLCALC, TRIG, CHOLHDL, LDLDIRECT in the last 72 hours. Thyroid Function Tests: No results for input(s): TSH, T4TOTAL, FREET4, T3FREE, THYROIDAB in the last 72 hours. Anemia Panel: No results for input(s): VITAMINB12, FOLATE, FERRITIN, TIBC, IRON, RETICCTPCT in the last 72 hours. Sepsis Labs: No results for input(s): PROCALCITON, LATICACIDVEN in the last 168 hours.  No results found for this or any previous visit (from the past 240 hour(s)).       Radiology Studies: Dg Chest 2 View  Result Date: 10/22/2016 CLINICAL DATA:  Shortness of breath with productive cough 2 days. COPD exacerbation. EXAM: CHEST  2 VIEW COMPARISON:  10/18/2016 FINDINGS: Lungs are hyperexpanded without focal consolidation or effusion. Mild flattening of the hemidiaphragms on the lateral film. Mild increased lucency of the mid to upper lungs. Cardiomediastinal silhouette is within normal.  Minimal degenerate change of the spine. Multiple old right rib fractures. IMPRESSION: No acute cardiopulmonary disease. Emphysema (ICD10-J43.9). Electronically Signed   By: Marin Olp M.D.   On: 10/22/2016 19:28        Scheduled Meds: . ARIPiprazole  2 mg Oral Daily  . docusate sodium  100 mg Oral BID  . doxycycline  100 mg Oral Q12H  . enoxaparin (LOVENOX) injection  40 mg Subcutaneous QHS  . escitalopram  20 mg Oral Daily  . ipratropium-albuterol  3 mL Nebulization TID  . methylPREDNISolone (SOLU-MEDROL) injection  60 mg Intravenous Q12H  . mometasone-formoterol  2 puff Inhalation BID  . nicotine  14 mg Transdermal Daily  . pravastatin  20 mg Oral q1800  .  protein supplement shake  11 oz Oral BID BM   Continuous Infusions:   LOS: 0 days    Time spent: 20 minutes    THOMPSON,DANIEL, MD [facility] Pager [phone]  If 7PM-7AM, please contact night-coverage [email or web address] Password [email or web address] 10/23/2016, 1:35 PM

## 2016-10-23 NOTE — Progress Notes (Signed)
Initial Nutrition Assessment  INTERVENTION:   Provide Premier Protein BID, each supplement provides 160 kcal and 30 grams of protein.   RD will continue to monitor  NUTRITION DIAGNOSIS:   Increased nutrient needs related to chronic illness (COPD) as evidenced by estimated needs.  GOAL:   Patient will meet greater than or equal to 90% of their needs   MONITOR:   PO intake, Supplement acceptance, Labs, Weight trends, I & O's  REASON FOR ASSESSMENT:   Consult COPD Protocol  ASSESSMENT:    61 y.o. male with medical history significant of colon cancer; current prostate cancer; COPD with ongoing tobacco dependence; and bipolar d/o presenting with c/o substernal chest pressure and SOB.  He would walk a little ways and then have to put his head in front of a fan or window to try to catch his breath.  It would happen anytime he started moving.  +cough, productive of greenish phlegm.  This has been going on for a couple of days.  He was spitting up phlegm onto his pillow during sleep.    Patient currently in room with PT at time of visit. Pt ate 75% of his breakfast this morning. Pt just recently released from prison which could have contributed to weight changes. Pt has lost 19 lb since 5/2 (10% wt loss x 5 months, significant for time frame).  Medications: Colace capsule BID Labs reviewed: Glucose 184  Diet Order:  Diet regular Room service appropriate? Yes; Fluid consistency: Thin  Skin:  Reviewed, no issues  Last BM:  10/2  Height:   Ht Readings from Last 1 Encounters:  10/22/16 5\' 11"  (1.803 m)    Weight:   Wt Readings from Last 1 Encounters:  10/22/16 166 lb 7.2 oz (75.5 kg)    Ideal Body Weight:  78.2 kg  BMI:  Body mass index is 23.21 kg/m.  Estimated Nutritional Needs:   Kcal:  2100-2300  Protein:  105-115g  Fluid:  2.1L/day  EDUCATION NEEDS:   No education needs identified at this time  Clayton Bibles, MS, RD, LDN Pager: [phone] After Hours  Pager: [phone]

## 2016-10-23 NOTE — Evaluation (Signed)
Physical Therapy Evaluation Patient Details Name: Taylor [name] MRN: [id number] DOB: 07/01/1955 Today's Date: 10/23/2016   History of Present Illness  61 y.o. male with PMH of colon cancer;  prostate cancer; COPD with ongoing tobacco dependence; and bipolar d/o admitted with COPD exacerbation.  Clinical Impression  Pt ambulated 400' without an assistive device with no loss of balance, SaO2 91-94% on room air while walking, 2/4 dyspnea. Instructed pt in energy conservation techniques. He reports he fatigues quickly with showering, shower seat and hand held shower recommended. OT consult placed to further address energy conservation. Pt is safe to ambulate independently in halls and was encouraged to do so. PT signing off as pt is independent with mobility.        Follow Up Recommendations No PT follow up    Equipment Recommendations  Other (comment) (shower seat and hand held shower)    Recommendations for Other Services       Precautions / Restrictions Precautions Precautions: None Restrictions Weight Bearing Restrictions: No      Mobility  Bed Mobility Overal bed mobility: Independent                Transfers Overall transfer level: Independent                  Ambulation/Gait Ambulation/Gait assistance: Independent Ambulation Distance (Feet): 400 Feet Assistive device: None Gait Pattern/deviations: WFL(Within Functional Limits)     General Gait Details: steady without AD, no LOB, SaO2 91-94% on room air while walking, 2/4 dyspnea, VCs pursed lip breathing  Stairs            Wheelchair Mobility    Modified Rankin (Stroke Patients Only)       Balance Overall balance assessment: Independent                                           Pertinent Vitals/Pain Pain Assessment: No/denies pain    Home Living Family/patient expects to be discharged to:: Group home ([facility])                  Additional Comments: flight of stairs to bedroom    Prior Function Level of Independence: Independent               Hand Dominance        Extremity/Trunk Assessment   Upper Extremity Assessment Upper Extremity Assessment: Overall WFL for tasks assessed    Lower Extremity Assessment Lower Extremity Assessment: Overall WFL for tasks assessed    Cervical / Trunk Assessment Cervical / Trunk Assessment: Normal  Communication   Communication: No difficulties  Cognition Arousal/Alertness: Awake/alert Behavior During Therapy: WFL for tasks assessed/performed Overall Cognitive Status: Within Functional Limits for tasks assessed                                        General Comments      Exercises     Assessment/Plan    PT Assessment Patent does not need any further PT services  PT Problem List         PT Treatment Interventions      PT Goals (Current goals can be found in the Care Plan section)  Acute Rehab PT Goals PT Goal Formulation: All assessment and education complete, DC therapy  Frequency     Barriers to discharge        Co-evaluation               AM-PAC PT "6 Clicks" Daily Activity  Outcome Measure Difficulty turning over in bed (including adjusting bedclothes, sheets and blankets)?: None Difficulty moving from lying on back to sitting on the side of the bed? : None Difficulty sitting down on and standing up from a chair with arms (e.g., wheelchair, bedside commode, etc,.)?: None Help needed moving to and from a bed to chair (including a wheelchair)?: None Help needed walking in hospital room?: None Help needed climbing 3-5 steps with a railing? : None 6 Click Score: 24    End of Session Equipment Utilized During Treatment: Gait belt Activity Tolerance: Patient tolerated treatment well Patient left: in chair;with call bell/phone within reach Nurse Communication: Mobility status      Time: 1107-1130 PT Time  Calculation (min) (ACUTE ONLY): 23 min   Charges:   PT Evaluation $PT Eval Low Complexity: 1 Low PT Treatments $Gait Training: 8-22 mins   PT G Codes:   PT G-Codes **NOT FOR INPATIENT CLASS** Functional Assessment Tool Used: AM-PAC 6 Clicks Basic Mobility Functional Limitation: Mobility: Walking and moving around Mobility: Walking and Moving Around Current Status (E0814): 0 percent impaired, limited or restricted Mobility: Walking and Moving Around Goal Status (G8185): 0 percent impaired, limited or restricted      Philomena Doheny 10/23/2016, 11:43 AM [phone]

## 2016-10-23 NOTE — Evaluation (Addendum)
Occupational Therapy Evaluation Patient Details Name: Taylor [name] MRN: [id number] DOB: 05-08-1955 Today's Date: 10/23/2016    History of Present Illness 61 y.o. male with PMH of colon cancer;  prostate cancer; COPD with ongoing tobacco dependence; and bipolar d/o admitted with COPD exacerbation.   Clinical Impression   This 61 y/o M presents with the above. At baseline Pt is independent with ADLs and functional mobility. Pt completed room level functional mobility, seated and standing ADLs with [employer]-supervision throughout. Pt's biggest limitation appears to be decreased activity tolerance/increased dyspnea with activity. Pt with increased dyspnea after completing standing grooming ADLs and with O2 dropping to mid-high 80's on RA, increases to 94% and above with 2L O2 and with seated rest break/deep breathing within approx 30 seconds. Education provided on energy conservation techniques and handout provided with Pt verbalizing understanding throughout. Feel Pt will safely return home with intermittent assist/supervision PRN. No further OT needs identified at this time. Will sign off.     Follow Up Recommendations  No OT follow up;Supervision - Intermittent    Equipment Recommendations  Tub/shower seat           Precautions / Restrictions Precautions Precautions: None Restrictions Weight Bearing Restrictions: No      Mobility Bed Mobility Overal bed mobility: Independent                Transfers Overall transfer level: Independent                    Balance Overall balance assessment: Independent                                         ADL either performed or assessed with clinical judgement   ADL Overall ADL's : Needs assistance/impaired Eating/Feeding: Independent;Sitting   Grooming: Oral care;Wash/dry face;Supervision/safety;Standing   Upper Body Bathing: Supervision/ safety;Sitting   Lower Body Bathing: Min guard;Sit  to/from stand   Upper Body Dressing : Supervision/safety;Sitting   Lower Body Dressing: Min guard;Sit to/from stand   Toilet Transfer: Min guard;Ambulation;Regular Museum/gallery exhibitions officer and Hygiene: Min guard;Sit to/from stand       Functional mobility during ADLs: Min guard General ADL Comments: Pt reports he does not use O2 at home, currently on 2L O2 when entering room, demonstrates increased dyspnea after completing standing grooming ADLs, O2 sats decreased to 85% on RA, increased to 94% and above within approx 30 seconds with seated rest break, instruction on deep breathing technique, and readministeration of 2L O2; educated Pt on energy conservation during ADL/iADL task completion and handout provided with Pt verbalizing understanding                         Pertinent Vitals/Pain Pain Assessment: No/denies pain          Extremity/Trunk Assessment Upper Extremity Assessment Upper Extremity Assessment: Overall WFL for tasks assessed   Lower Extremity Assessment Lower Extremity Assessment: Overall WFL for tasks assessed   Cervical / Trunk Assessment Cervical / Trunk Assessment: Normal   Communication Communication Communication: No difficulties   Cognition Arousal/Alertness: Awake/alert Behavior During Therapy: WFL for tasks assessed/performed Overall Cognitive Status: Within Functional Limits for tasks assessed  Home Living Family/patient expects to be discharged to:: Group home [facility] recovery home )                                 Additional Comments: flight of stairs to bedroom; tub/shower unit      Prior Functioning/Environment Level of Independence: Independent                 OT Problem List: Cardiopulmonary status limiting activity;Decreased activity tolerance            OT Goals(Current goals can be found in the care plan  section) Acute Rehab OT Goals Patient Stated Goal: increase endurance  OT Goal Formulation: With patient                                 AM-PAC PT "6 Clicks" Daily Activity     Outcome Measure Help from another person eating meals?: None Help from another person taking care of personal grooming?: None Help from another person toileting, which includes using toliet, bedpan, or urinal?: None Help from another person bathing (including washing, rinsing, drying)?: None Help from another person to put on and taking off regular upper body clothing?: None Help from another person to put on and taking off regular lower body clothing?: A Little 6 Click Score: 23   End of Session Equipment Utilized During Treatment: Oxygen Nurse Communication: Mobility status  Activity Tolerance: Patient tolerated treatment well Patient left: in chair;with call bell/phone within reach  OT Visit Diagnosis: Other (comment) (decreased activity tolerance, increased dyspnea )                Time: 6378-5885 OT Time Calculation (min): 24 min Charges:  OT General Charges $OT Visit: 1 Visit OT Evaluation $OT Eval Low Complexity: 1 Low G-Codes: OT G-codes **NOT FOR INPATIENT CLASS** Functional Assessment Tool Used: AM-PAC 6 Clicks Daily Activity;Clinical judgement Functional Limitation: Self care Self Care Current Status (O2774): At least 1 percent but less than 20 percent impaired, limited or restricted Self Care Goal Status (J2878): At least 1 percent but less than 20 percent impaired, limited or restricted Self Care Discharge Status [phone]): At least 1 percent but less than 20 percent impaired, limited or restricted   Lou Cal, OT Pager [phone] 10/23/2016   Raymondo Band 10/23/2016, 2:01 PM

## 2016-10-24 LAB — CBC
HEMATOCRIT: 39.5 % (ref 39.0–52.0)
Hemoglobin: 13.3 g/dL (ref 13.0–17.0)
MCH: 31.2 pg (ref 26.0–34.0)
MCHC: 33.7 g/dL (ref 30.0–36.0)
MCV: 92.7 fL (ref 78.0–100.0)
Platelets: 219 10*3/uL (ref 150–400)
RBC: 4.26 MIL/uL (ref 4.22–5.81)
RDW: 13.4 % (ref 11.5–15.5)
WBC: 15.1 10*3/uL — AB (ref 4.0–10.5)

## 2016-10-24 LAB — BASIC METABOLIC PANEL
ANION GAP: 6 (ref 5–15)
BUN: 17 mg/dL (ref 6–20)
CHLORIDE: 104 mmol/L (ref 101–111)
CO2: 29 mmol/L (ref 22–32)
Calcium: 8.7 mg/dL — ABNORMAL LOW (ref 8.9–10.3)
Creatinine, Ser: 0.91 mg/dL (ref 0.61–1.24)
GFR calc Af Amer: >60 mL/min (ref >60)
GFR calc non Af Amer: >60 mL/min (ref >60)
GLUCOSE: 178 mg/dL — AB (ref 65–99)
Potassium: 4.1 mmol/L (ref 3.5–5.1)
Sodium: 139 mmol/L (ref 135–145)

## 2016-10-24 MED ORDER — POLYETHYLENE GLYCOL 3350 17 G PO PACK: 17.0000 g | PACK | Freq: Two times a day (BID) | ORAL | 0 refills | Status: DC

## 2016-10-24 MED ORDER — TIOTROPIUM BROMIDE MONOHYDRATE 18 MCG IN CAPS
18.0000 ug | ORAL_CAPSULE | Freq: Every day | RESPIRATORY_TRACT | Status: DC
  Filled 2016-10-24: qty 5

## 2016-10-24 MED ORDER — ALBUTEROL SULFATE HFA 108 (90 BASE) MCG/ACT IN AERS: 1.0000 | INHALATION_SPRAY | Freq: Four times a day (QID) | RESPIRATORY_TRACT | 0 refills | Status: DC | PRN

## 2016-10-24 MED ORDER — NICOTINE 14 MG/24HR TD PT24: 14.0000 mg | MEDICATED_PATCH | Freq: Every day | TRANSDERMAL | 0 refills | Status: DC

## 2016-10-24 MED ORDER — PREDNISONE 20 MG PO TABS: 60.0000 mg | ORAL_TABLET | Freq: Every day | ORAL | Status: DC

## 2016-10-24 MED ORDER — PREDNISONE 20 MG PO TABS: 20.0000 mg | ORAL_TABLET | Freq: Every day | ORAL | 0 refills | Status: DC

## 2016-10-24 MED ORDER — TIOTROPIUM BROMIDE MONOHYDRATE 2.5 MCG/ACT IN AERS: 2.0000 | INHALATION_SPRAY | Freq: Every day | RESPIRATORY_TRACT | 1 refills | Status: DC

## 2016-10-24 MED ORDER — LOVASTATIN 20 MG PO TABS: 20.0000 mg | ORAL_TABLET | Freq: Every day | ORAL | 0 refills | Status: DC

## 2016-10-24 MED ORDER — ALBUTEROL SULFATE HFA 108 (90 BASE) MCG/ACT IN AERS
2.0000 | INHALATION_SPRAY | Freq: Four times a day (QID) | RESPIRATORY_TRACT | Status: DC | PRN
Start: 2016-10-24 — End: 2016-10-24
  Administered 2016-10-24: 2 via RESPIRATORY_TRACT
  Filled 2016-10-24: qty 6.7

## 2016-10-24 MED ORDER — DOXYCYCLINE HYCLATE 100 MG PO TABS: 100.0000 mg | ORAL_TABLET | Freq: Two times a day (BID) | ORAL | 0 refills | Status: DC

## 2016-10-24 MED ORDER — PREMIER PROTEIN SHAKE: 11.0000 [oz_av] | Freq: Two times a day (BID) | ORAL | 0 refills | Status: DC

## 2016-10-24 MED ORDER — MOMETASONE FURO-FORMOTEROL FUM 200-5 MCG/ACT IN AERO: INHALATION_SPRAY | RESPIRATORY_TRACT | 1 refills | Status: DC

## 2016-10-24 MED ORDER — SENNOSIDES-DOCUSATE SODIUM 8.6-50 MG PO TABS: 1.0000 | ORAL_TABLET | Freq: Two times a day (BID) | ORAL | Status: DC

## 2016-10-24 MED ORDER — ALBUTEROL SULFATE (2.5 MG/3ML) 0.083% IN NEBU: 2.5000 mg | INHALATION_SOLUTION | Freq: Three times a day (TID) | RESPIRATORY_TRACT | Status: DC

## 2016-10-24 MED ORDER — ALBUTEROL SULFATE (2.5 MG/3ML) 0.083% IN NEBU: 2.5000 mg | INHALATION_SOLUTION | Freq: Three times a day (TID) | RESPIRATORY_TRACT | 0 refills | Status: DC

## 2016-10-24 NOTE — Discharge Summary (Signed)
Physician Discharge Summary  Taylor [name] VHQ:[id number] DOB: March 10, 1955 DOA: 10/22/2016  PCP: System, Provider Not In  Admit date: 10/22/2016 Discharge date: 10/24/2016  Time spent: 50 minutes  Recommendations for Outpatient Follow-up:  1. Follow-up with PCP in 2 weeks. On follow-up patient COPD would need to be reassessed. Patient will need a basic metabolic profile done to follow-up on electrolytes and renal function.    Discharge Diagnoses:  Principal Problem:   COPD with acute exacerbation ([place]) Active Problems:   Hyperglycemia   Bipolar 1 disorder ([place])   Smoker   Discharge Condition: Stable and improved  Diet recommendation: Heart healthy  Filed Weights   10/22/16 1804 10/22/16 2326  Weight: 76.2 kg (168 lb) 75.5 kg (166 lb 7.2 oz)    History of present illness:  Per Dr Beckie Salts is a 61 y.o. male with medical history significant of colon cancer; current prostate cancer; COPD with ongoing tobacco dependence; and bipolar d/o presenting with c/o substernal chest pressure and SOB.  He would walk a little ways and then have to put his head in front of a fan or window to try to catch his breath.  It would happen anytime he started moving.  +cough, productive of greenish phlegm.  This had been going on for a couple of days.  He was spitting up phlegm onto his pillow during sleep.  No fevers.  No sick contacts.  Released from prison after 30 months + 90 days, released Sept 16.  He was in prison for failing to register as a sex offender.  He lost disability while he was in prison - had disability through mental health and it is still pending now.  He has an appt for evaluation on Oct 9.  ED Course:  O2 sats to 89% with ambulation, negative CXR, COPD exacerbation with increased O2 demand   Hospital Course:  #1 acute COPD exacerbation Patient presenting with shortness of breath, productive cough of greenish sputum, ongoing tobacco abuse, wheezing noted  to be hypoxic on ambulation on admission. Patient Was placed empirically on IV Solu-Medrol, scheduled nebs, oral doxycycline which patient tolerated. Patient improved clinically and subsequently transitioned to oral prednisone. Patient improved and will be discharged home on a prednisone taper, scheduled albuterol inhaler 3 times daily for the next 4-5 days then as needed, oral doxycycline for 6 more days. Patient will be discharged home in stable and improved condition.   #2 bipolar disorder Stable. Continued on home regimen of Abilify, Lexapro.  #3 tobacco abuse Tobacco cessation. Patient was placed on a nicotine patch.  #4 hyperglycemia Likely to be elevated on steroids. Outpatient follow-up.   Procedures:  Chest x-ray 10/22/2016  Consultations:  None  Discharge Exam: Vitals:   10/23/16 1957 10/24/16 0453  BP:  (!) 143/84  Pulse:  79  Resp:  16  Temp:  97.8 F (36.6 C)  SpO2: 92% 93%    General: NAD Cardiovascular: RRR Respiratory: Minimal wheezing  Discharge Instructions   Discharge Instructions    Diet general    Complete by:  As directed    Increase activity slowly    Complete by:  As directed      Current Discharge Medication List    START taking these medications   Details  doxycycline (VIBRA-TABS) 100 MG tablet Take 1 tablet (100 mg total) by mouth every 12 (twelve) hours. Take for 6 days then stop. Qty: 12 tablet, Refills: 0    nicotine (NICODERM CQ - DOSED IN  MG/24 HOURS) 14 mg/24hr patch Place 1 patch (14 mg total) onto the skin daily. Qty: 28 patch, Refills: 0    polyethylene glycol (MIRALAX / GLYCOLAX) packet Take 17 g by mouth 2 (two) times daily. Qty: 14 each, Refills: 0    protein supplement shake (PREMIER PROTEIN) LIQD Take 325 mLs (11 oz total) by mouth 2 (two) times daily between meals. Refills: 0    senna-docusate (SENOKOT-S) 8.6-50 MG tablet Take 1 tablet by mouth 2 (two) times daily.      CONTINUE these medications which have  CHANGED   Details  albuterol (PROVENTIL HFA;VENTOLIN HFA) 108 (90 Base) MCG/ACT inhaler Inhale 1-2 puffs into the lungs every 6 (six) hours as needed for wheezing. Qty: 1 Inhaler, Refills: 0    albuterol (PROVENTIL) (2.5 MG/3ML) 0.083% nebulizer solution Take 3 mLs (2.5 mg total) by nebulization 3 (three) times daily. Use 3 times daily x 4 days, then every 6 hours as needed. Qty: 120 mL, Refills: 0    lovastatin (MEVACOR) 20 MG tablet Take 1 tablet (20 mg total) by mouth at bedtime. Qty: 30 tablet, Refills: 0    mometasone-formoterol (DULERA) 200-5 MCG/ACT AERO INHALE 2 PUFFS INTO THE LUNGS FIRST THING IN THE MORNING THEN ANOTHER 2 PUFFS 12 HOURS LATER. Qty: 13 g, Refills: 1    predniSONE (DELTASONE) 20 MG tablet Take 1-3 tablets (20-60 mg total) by mouth daily before breakfast. Take 3 tablets (60mg ) daily x 2 days, then 2 tablets (40mg ) daily x 3 days, then 1 tablet (20mg ) daily x 3 days then stop. Qty: 15 tablet, Refills: 0    Tiotropium Bromide Monohydrate (SPIRIVA RESPIMAT) 2.5 MCG/ACT AERS Inhale 2 puffs into the lungs daily. Qty: 1 Inhaler, Refills: 1      CONTINUE these medications which have NOT CHANGED   Details  ARIPiprazole (ABILIFY) 2 MG tablet Take 2 mg by mouth every morning.     escitalopram (LEXAPRO) 20 MG tablet Take 20 mg by mouth every morning.     ondansetron (ZOFRAN ODT) 4 MG disintegrating tablet Take 1 tablet (4 mg total) by mouth every 8 (eight) hours as needed for nausea or vomiting. Qty: 20 tablet, Refills: 0       Allergies  Allergen Reactions  . Codeine Nausea And Vomiting   Follow-up Information    pcp. Schedule an appointment as soon as possible for a visit in 2 week(s).            The results of significant diagnostics from this hospitalization (including imaging, microbiology, ancillary and laboratory) are listed below for reference.    Significant Diagnostic Studies: Dg Chest 2 View  Result Date: 10/22/2016 CLINICAL DATA:  Shortness of  breath with productive cough 2 days. COPD exacerbation. EXAM: CHEST  2 VIEW COMPARISON:  10/18/2016 FINDINGS: Lungs are hyperexpanded without focal consolidation or effusion. Mild flattening of the hemidiaphragms on the lateral film. Mild increased lucency of the mid to upper lungs. Cardiomediastinal silhouette is within normal. Minimal degenerate change of the spine. Multiple old right rib fractures. IMPRESSION: No acute cardiopulmonary disease. Emphysema (ICD10-J43.9). Electronically Signed   By: Marin Olp M.D.   On: 10/22/2016 19:28   Dg Chest 2 View  Result Date: 10/18/2016 CLINICAL DATA:  Shortness of breath EXAM: CHEST  2 VIEW COMPARISON:  05/25/2016 FINDINGS: Hyperinflation. Emphysematous changes. No acute consolidation or pleural effusion. Normal cardiomediastinal silhouette. Multiple old right upper rib deformities. IMPRESSION: Hyperinflation. No radiographic evidence for acute cardiopulmonary abnormality. Electronically Signed   By: Maudie Mercury  Francoise Ceo M.D.   On: 10/18/2016 19:26   Ct Abdomen Pelvis W Contrast  Result Date: 10/18/2016 CLINICAL DATA:  Left-sided abdominal pain. Patient reports intermittent pain for years. EXAM: CT ABDOMEN AND PELVIS WITH CONTRAST TECHNIQUE: Multidetector CT imaging of the abdomen and pelvis was performed using the standard protocol following bolus administration of intravenous contrast. CONTRAST:  168mL ISOVUE-300 IOPAMIDOL (ISOVUE-300) INJECTION 61% COMPARISON:  CT 05/22/2016 FINDINGS: Lower chest: Emphysema.  No basilar consolidation or pleural fluid. Hepatobiliary: No focal liver abnormality is seen. No gallstones, gallbladder wall thickening, or biliary dilatation. Pancreas: No ductal dilatation or inflammation. Spleen: Normal in size without focal abnormality. Adrenals/Urinary Tract: No adrenal nodule. No hydronephrosis or perinephric edema. Homogeneous renal enhancement with symmetric excretion on delayed phase imaging. Small subcentimeter low-density lesion in  the posterior left kidney in small exophytic cyst from the posterior right kidney are unchanged from prior exam, too small to accurately characterize. Urinary bladder is physiologically distended without wall thickening. Stomach/Bowel: Tiny hiatal hernia. Stomach is physiologically distended. No small bowel inflammation, obstruction or wall thickening. Normal appendix. Moderate colonic stool burden and colonic tortuosity. Mild distal descending and sigmoid colonic diverticulosis without acute inflammation. Enteric suture in the sigmoid. Vascular/Lymphatic: Aortic atherosclerosis and tortuosity. Ectasia of the infrarenal aorta maximal dimension 2.5 cm. Circumaortic left renal vein. Reproductive: Prostate gland prominent in size. Other: Fat in the left inguinal canal. No free air, free fluid, or intra-abdominal fluid collection. Musculoskeletal: There are no acute or suspicious osseous abnormalities. IMPRESSION: 1. No acute abnormality in the abdomen/pelvis. 2. Mild colonic diverticulosis without diverticulitis. Moderate colonic stool burden can be seen with constipation. 3. Aortic Atherosclerosis (ICD10-I70.0). Unchanged 2.5 cm infrarenal aortic ectasia. Recommend follow-up ultrasound in 5 years. Electronically Signed   By: Jeb Levering M.D.   On: 10/18/2016 22:53    Microbiology: No results found for this or any previous visit (from the past 240 hour(s)).   Labs: Basic Metabolic Panel:  Recent Labs Lab 10/18/16 1852 10/18/16 2017 10/22/16 1830 10/23/16 0745 10/24/16 0354  NA 136 140 142 138 139  K 3.7 3.8 3.5 3.9 4.1  CL 98* 97* 103 101 104  CO2 29  --  31 29 29   GLUCOSE 118* 112* 130* 184* 178*  BUN 12 16 12 14 17   CREATININE 0.99 1.00 0.81 0.82 0.91  CALCIUM 8.8*  --  8.4* 8.5* 8.7*   Liver Function Tests:  Recent Labs Lab 10/18/16 1852  AST 19  ALT 15*  ALKPHOS 74  BILITOT 0.6  PROT 6.7  ALBUMIN 4.3    Recent Labs Lab 10/18/16 1852  LIPASE 33   No results for  input(s): AMMONIA in the last 168 hours. CBC:  Recent Labs Lab 10/18/16 1852 10/18/16 2017 10/22/16 1830 10/23/16 0745 10/24/16 0354  WBC 9.5  --  7.6 8.4 15.1*  HGB 14.5 15.0 14.1 13.9 13.3  HCT 44.2 44.0 41.3 40.8 39.5  MCV 95.1  --  94.9 92.5 92.7  PLT 262  --  206 223 219   Cardiac Enzymes:  Recent Labs Lab 10/22/16 2347 10/23/16 0745  TROPONINI <0.03 <0.03   BNP: BNP (last 3 results) No results for input(s): BNP in the last 8760 hours.  ProBNP (last 3 results) No results for input(s): PROBNP in the last 8760 hours.  CBG: No results for input(s): GLUCAP in the last 168 hours.     SignedIrine Seal MD.  [facility] 10/24/2016, 2:24 PM

## 2016-10-24 NOTE — Progress Notes (Addendum)
[place] letter provided to pt for discharge medications as pt states he has no insurance. Pt is also without PCP. He was given [facility] packet and encouraged to call and make follow up appointment. Tub bench ordered for pt and pt declines at this time. Marney Doctor RN,BSN,NCM [phone]

## 2016-10-28 ENCOUNTER — Ambulatory Visit (INDEPENDENT_AMBULATORY_CARE_PROVIDER_SITE_OTHER): Payer: Self-pay | Admitting: Physician Assistant

## 2016-10-28 ENCOUNTER — Encounter (INDEPENDENT_AMBULATORY_CARE_PROVIDER_SITE_OTHER): Payer: Self-pay | Admitting: Physician Assistant

## 2016-10-28 VITALS — BP 102/75 | HR 99 | Temp 98.0°F | Wt 166.2 lb

## 2016-10-28 DIAGNOSIS — Z23 Encounter for immunization: Secondary | ICD-10-CM

## 2016-10-28 DIAGNOSIS — J449 Chronic obstructive pulmonary disease, unspecified: Secondary | ICD-10-CM

## 2016-10-28 DIAGNOSIS — Z1159 Encounter for screening for other viral diseases: Secondary | ICD-10-CM

## 2016-10-28 MED ORDER — FLUTICASONE FUROATE-VILANTEROL 200-25 MCG/INH IN AEPB: 1.0000 | INHALATION_SPRAY | Freq: Every day | RESPIRATORY_TRACT | 11 refills | Status: DC

## 2016-10-28 MED ORDER — DOXYCYCLINE HYCLATE 100 MG PO TABS: 100.0000 mg | ORAL_TABLET | Freq: Two times a day (BID) | ORAL | 0 refills | Status: AC

## 2016-10-28 MED ORDER — ALBUTEROL SULFATE HFA 108 (90 BASE) MCG/ACT IN AERS: 1.0000 | INHALATION_SPRAY | Freq: Four times a day (QID) | RESPIRATORY_TRACT | 0 refills | Status: DC | PRN

## 2016-10-28 MED ORDER — ALBUTEROL SULFATE (2.5 MG/3ML) 0.083% IN NEBU: 2.5000 mg | INHALATION_SOLUTION | Freq: Three times a day (TID) | RESPIRATORY_TRACT | 0 refills | Status: DC

## 2016-10-28 MED FILL — !VENTOLIN HFA INHALER: 108 (90 BAS | 30 days supply | Qty: 1 | Fill #0

## 2016-10-28 MED FILL — ALBUTEROL 0.083% INHAL SOLN: (2.5 MG/3ML | 7 days supply | Qty: 90 | Fill #0

## 2016-10-28 MED FILL — DOXYCYCLINE 100 MG TABLET: 100 | 6 days supply | Qty: 12 | Fill #0

## 2016-10-28 NOTE — Patient Instructions (Addendum)
Please go to [place] to consult with pharmacist about using the PASS program to get free or discounted medicines.  Chronic Obstructive Pulmonary Disease Chronic obstructive pulmonary disease (COPD) is a common lung condition in which airflow from the lungs is limited. COPD is a general term that can be used to describe many different lung problems that limit airflow, including both chronic bronchitis and emphysema. If you have COPD, your lung function will probably never return to normal, but there are measures you can take to improve lung function and make yourself feel better. What are the causes?  Smoking (common).  Exposure to secondhand smoke.  Genetic problems.  Chronic inflammatory lung diseases or recurrent infections. What are the signs or symptoms?  Shortness of breath, especially with physical activity.  Deep, persistent (chronic) cough with a large amount of thick mucus.  Wheezing.  Rapid breaths (tachypnea).  Gray or bluish discoloration (cyanosis) of the skin, especially in your fingers, toes, or lips.  Fatigue.  Weight loss.  Frequent infections or episodes when breathing symptoms become much worse (exacerbations).  Chest tightness. How is this diagnosed? Your health care provider will take a medical history and perform a physical examination to diagnose COPD. Additional tests for COPD may include:  Lung (pulmonary) function tests.  Chest X-ray.  CT scan.  Blood tests.  How is this treated? Treatment for COPD may include:  Inhaler and nebulizer medicines. These help manage the symptoms of COPD and make your breathing more comfortable.  Supplemental oxygen. Supplemental oxygen is only helpful if you have a low oxygen level in your blood.  Exercise and physical activity. These are beneficial for nearly all people with COPD.  Lung surgery or transplant.  Nutrition therapy to gain weight, if you are  underweight.  Pulmonary rehabilitation. This may involve working with a team of health care providers and specialists, such as respiratory, occupational, and physical therapists.  Follow these instructions at home:  Take all medicines (inhaled or pills) as directed by your health care provider.  Avoid over-the-counter medicines or cough syrups that dry up your airway (such as antihistamines) and slow down the elimination of secretions unless instructed otherwise by your health care provider.  If you are a smoker, the most important thing that you can do is stop smoking. Continuing to smoke will cause further lung damage and breathing trouble. Ask your health care provider for help with quitting smoking. He or she can direct you to community resources or hospitals that provide support.  Avoid exposure to irritants such as smoke, chemicals, and fumes that aggravate your breathing.  Use oxygen therapy and pulmonary rehabilitation if directed by your health care provider. If you require home oxygen therapy, ask your health care provider whether you should purchase a pulse oximeter to measure your oxygen level at home.  Avoid contact with individuals who have a contagious illness.  Avoid extreme temperature and humidity changes.  Eat healthy foods. Eating smaller, more frequent meals and resting before meals may help you maintain your strength.  Stay active, but balance activity with periods of rest. Exercise and physical activity will help you maintain your ability to do things you want to do.  Preventing infection and hospitalization is very important when you have COPD. Make sure to receive all the vaccines your health care provider recommends, especially the pneumococcal and influenza vaccines. Ask your health care provider whether you need a pneumonia vaccine.  Learn and use relaxation techniques to manage stress.  Learn and use controlled breathing techniques as directed by your health  care provider. Controlled breathing techniques include: 1. Pursed lip breathing. Start by breathing in (inhaling) through your nose for 1 second. Then, purse your lips as if you were going to whistle and breathe out (exhale) through the pursed lips for 2 seconds. 2. Diaphragmatic breathing. Start by putting one hand on your abdomen just above your waist. Inhale slowly through your nose. The hand on your abdomen should move out. Then purse your lips and exhale slowly. You should be able to feel the hand on your abdomen moving in as you exhale.  Learn and use controlled coughing to clear mucus from your lungs. Controlled coughing is a series of short, progressive coughs. The steps of controlled coughing are: 1. Lean your head slightly forward. 2. Breathe in deeply using diaphragmatic breathing. 3. Try to hold your breath for 3 seconds. 4. Keep your mouth slightly open while coughing twice. 5. Spit any mucus out into a tissue. 6. Rest and repeat the steps once or twice as needed. Contact a health care provider if:  You are coughing up more mucus than usual.  There is a change in the color or thickness of your mucus.  Your breathing is more labored than usual.  Your breathing is faster than usual. Get help right away if:  You have shortness of breath while you are resting.  You have shortness of breath that prevents you from: ? Being able to talk. ? Performing your usual physical activities.  You have chest pain lasting longer than 5 minutes.  Your skin color is more cyanotic than usual.  You measure low oxygen saturations for longer than 5 minutes with a pulse oximeter. This information is not intended to replace advice given to you by your health care provider. Make sure you discuss any questions you have with your health care provider. Document Released: 10/17/2004 Document Revised: 06/15/2015 Document Reviewed: 09/03/2012 Elsevier Interactive Patient Education  2017 [employer].

## 2016-10-28 NOTE — Progress Notes (Signed)
Subjective:  Patient ID: Taylor [name], male    DOB: 06-20-1955  Age: 61 y.o. MRN: [id number]  CC: COPD  HPI Taylor [name] is a 61 y.o. male with a medical history of bipolar 1 disorder, adenocarcinoma of colon s/p colectomy 04/11/2011, COPD, depression, anxiety, hemorrhoid, inguinal hernia, prostate cancer, and rib fractures presents for refill of inhalers. Went to ED 6 days ago for COPD exacerbation. Has not been able to pick up doxycycline prescribed at ED. No current cough, fever, or chills. States Spiriva is not effective for him. Would like a refill of albuterol inhaler and albuterol nebulizer solution. Does not endorse CP, palpitations, HA, abdominal pain, rash, or GI/GU sxs.       Outpatient Medications Prior to Visit  Medication Sig Dispense Refill  . albuterol (PROVENTIL HFA;VENTOLIN HFA) 108 (90 Base) MCG/ACT inhaler Inhale 1-2 puffs into the lungs every 6 (six) hours as needed for wheezing. 1 Inhaler 0  . albuterol (PROVENTIL) (2.5 MG/3ML) 0.083% nebulizer solution Take 3 mLs (2.5 mg total) by nebulization 3 (three) times daily. Use 3 times daily x 4 days, then every 6 hours as needed. 120 mL 0  . ARIPiprazole (ABILIFY) 2 MG tablet Take 2 mg by mouth every morning.     . escitalopram (LEXAPRO) 20 MG tablet Take 20 mg by mouth every morning.     . lovastatin (MEVACOR) 20 MG tablet Take 1 tablet (20 mg total) by mouth at bedtime. 30 tablet 0  . mometasone-formoterol (DULERA) 200-5 MCG/ACT AERO INHALE 2 PUFFS INTO THE LUNGS FIRST THING IN THE MORNING THEN ANOTHER 2 PUFFS 12 HOURS LATER. 13 g 1  . nicotine (NICODERM CQ - DOSED IN MG/24 HOURS) 14 mg/24hr patch Place 1 patch (14 mg total) onto the skin daily. 28 patch 0  . Tiotropium Bromide Monohydrate (SPIRIVA RESPIMAT) 2.5 MCG/ACT AERS Inhale 2 puffs into the lungs daily. 1 Inhaler 1  . doxycycline (VIBRA-TABS) 100 MG tablet Take 1 tablet (100 mg total) by mouth every 12 (twelve) hours. Take for 6 days then stop.  (Patient not taking: Reported on 10/28/2016) 12 tablet 0  . ondansetron (ZOFRAN ODT) 4 MG disintegrating tablet Take 1 tablet (4 mg total) by mouth every 8 (eight) hours as needed for nausea or vomiting. (Patient not taking: Reported on 10/28/2016) 20 tablet 0  . polyethylene glycol (MIRALAX / GLYCOLAX) packet Take 17 g by mouth 2 (two) times daily. (Patient not taking: Reported on 10/28/2016) 14 each 0  . predniSONE (DELTASONE) 20 MG tablet Take 1-3 tablets (20-60 mg total) by mouth daily before breakfast. Take 3 tablets (60mg ) daily x 2 days, then 2 tablets (40mg ) daily x 3 days, then 1 tablet (20mg ) daily x 3 days then stop. (Patient not taking: Reported on 10/28/2016) 15 tablet 0  . protein supplement shake (PREMIER PROTEIN) LIQD Take 325 mLs (11 oz total) by mouth 2 (two) times daily between meals. (Patient not taking: Reported on 10/28/2016)  0  . senna-docusate (SENOKOT-S) 8.6-50 MG tablet Take 1 tablet by mouth 2 (two) times daily. (Patient not taking: Reported on 10/28/2016)     No facility-administered medications prior to visit.      ROS Review of Systems  Constitutional: Negative for chills, fever and malaise/fatigue.  Eyes: Negative for blurred vision.  Respiratory: Positive for shortness of breath (not currently).   Cardiovascular: Negative for chest pain and palpitations.  Gastrointestinal: Negative for abdominal pain and nausea.  Genitourinary: Negative for dysuria and hematuria.  Musculoskeletal: Negative for  joint pain and myalgias.  Skin: Negative for rash.  Neurological: Negative for tingling and headaches.  Psychiatric/Behavioral: Negative for depression. The patient is not nervous/anxious.     Objective:  BP 102/75 (BP Location: Left Arm, Patient Position: Sitting, Cuff Size: Normal)   Pulse 99   Temp 98 F (36.7 C) (Oral)   Wt 166 lb 3.2 oz (75.4 kg)   SpO2 98%   BMI 23.18 kg/m   BP/Weight 10/28/2016 10/24/2016 29/09/3714  Systolic BP 967 893 -  Diastolic BP 75 84 -   Wt. (Lbs) 166.2 - 166.45  BMI 23.18 - 23.21      Physical Exam  Constitutional: He is oriented to person, place, and time.  Well developed, well nourished, NAD, polite  HENT:  Head: Normocephalic and atraumatic.  Eyes: No scleral icterus.  Neck: Normal range of motion. Neck supple. No thyromegaly present.  Cardiovascular: Normal rate, regular rhythm and normal heart sounds.   Pulmonary/Chest: Effort normal.  Poor air movement throughout. No accessory muscle use.  Musculoskeletal: He exhibits no edema.  Neurological: He is alert and oriented to person, place, and time. No cranial nerve deficit. Coordination normal.  Skin: Skin is warm and dry. No rash noted. No erythema. No pallor.  Psychiatric: He has a normal mood and affect. His behavior is normal. Thought content normal.  Vitals reviewed.    Assessment & Plan:   1. Chronic obstructive pulmonary disease, unspecified COPD type (HCC) - fluticasone furoate-vilanterol (BREO ELLIPTA) 200-25 MCG/INH AEPB; Inhale 1 puff into the lungs daily.  Dispense: 1 each; Refill: 11 - albuterol (PROVENTIL HFA;VENTOLIN HFA) 108 (90 Base) MCG/ACT inhaler; Inhale 1-2 puffs into the lungs every 6 (six) hours as needed for wheezing.  Dispense: 1 Inhaler; Refill: 0 - doxycycline (VIBRA-TABS) 100 MG tablet; Take 1 tablet (100 mg total) by mouth every 12 (twelve) hours. Take for 6 days then stop.  Dispense: 12 tablet; Refill: 0  2. Need for Tdap vaccination - Tdap vaccine greater than or equal to 7yo IM  3. Need for prophylactic vaccination and inoculation against influenza - Flu Vaccine QUAD 6+ mos PF IM (Fluarix Quad PF)  4. Need for hepatitis C screening test - Hepatitis c antibody (reflex)   Meds ordered this encounter  Medications  . fluticasone furoate-vilanterol (BREO ELLIPTA) 200-25 MCG/INH AEPB    Sig: Inhale 1 puff into the lungs daily.    Dispense:  1 each    Refill:  11    Order Specific Question:   Supervising Provider     Answer:   Tresa Garter [id number]  . albuterol (PROVENTIL HFA;VENTOLIN HFA) 108 (90 Base) MCG/ACT inhaler    Sig: Inhale 1-2 puffs into the lungs every 6 (six) hours as needed for wheezing.    Dispense:  1 Inhaler    Refill:  0    Order Specific Question:   Supervising Provider    Answer:   Tresa Garter [id number]  . doxycycline (VIBRA-TABS) 100 MG tablet    Sig: Take 1 tablet (100 mg total) by mouth every 12 (twelve) hours. Take for 6 days then stop.    Dispense:  12 tablet    Refill:  0    Order Specific Question:   Supervising Provider    Answer:   Tresa Garter [[id number]]    Follow-up: PRN  Clent Demark PA

## 2016-10-30 ENCOUNTER — Telehealth (INDEPENDENT_AMBULATORY_CARE_PROVIDER_SITE_OTHER): Payer: Self-pay

## 2016-10-30 LAB — HCV COMMENT:

## 2016-10-30 LAB — HEPATITIS C ANTIBODY (REFLEX)

## 2016-10-30 NOTE — Telephone Encounter (Signed)
-----   Message from Clent Demark, PA-C sent at 10/30/2016 12:32 PM EDT ----- Negative for HCV.

## 2016-10-30 NOTE — Telephone Encounter (Signed)
Left a message asking patient to call the office. Nat Christen, CMA

## 2016-10-31 ENCOUNTER — Telehealth (INDEPENDENT_AMBULATORY_CARE_PROVIDER_SITE_OTHER): Payer: Self-pay

## 2016-10-31 NOTE — Telephone Encounter (Signed)
Results given to [facility], friend of the patient that is listed on the DPR, he will give results to patient, as patient was unavailable to talk. Nat Christen, CMA

## 2016-10-31 NOTE — Telephone Encounter (Signed)
-----   Message from Clent Demark, PA-C sent at 10/30/2016 12:32 PM EDT ----- Negative for HCV.

## 2016-12-04 ENCOUNTER — Emergency Department (HOSPITAL_COMMUNITY): Payer: Medicaid Other

## 2016-12-04 ENCOUNTER — Encounter (HOSPITAL_COMMUNITY): Payer: Self-pay | Admitting: *Deleted

## 2016-12-04 ENCOUNTER — Emergency Department (HOSPITAL_COMMUNITY)
Admission: EM | Admit: 2016-12-04 | Discharge: 2016-12-06 | Disposition: A | Discharge status: Home / Self Care | Payer: Medicaid Other | Attending: Emergency Medicine | Admitting: Emergency Medicine

## 2016-12-04 DIAGNOSIS — Z79899 Other long term (current) drug therapy: Secondary | ICD-10-CM | POA: Insufficient documentation

## 2016-12-04 DIAGNOSIS — R51 Headache: Secondary | ICD-10-CM | POA: Diagnosis not present

## 2016-12-04 DIAGNOSIS — F330 Major depressive disorder, recurrent, mild: Secondary | ICD-10-CM | POA: Diagnosis present

## 2016-12-04 DIAGNOSIS — Z7982 Long term (current) use of aspirin: Secondary | ICD-10-CM | POA: Diagnosis not present

## 2016-12-04 DIAGNOSIS — F17210 Nicotine dependence, cigarettes, uncomplicated: Secondary | ICD-10-CM | POA: Insufficient documentation

## 2016-12-04 DIAGNOSIS — J441 Chronic obstructive pulmonary disease with (acute) exacerbation: Secondary | ICD-10-CM | POA: Insufficient documentation

## 2016-12-04 DIAGNOSIS — R0602 Shortness of breath: Secondary | ICD-10-CM | POA: Diagnosis present

## 2016-12-04 DIAGNOSIS — F319 Bipolar disorder, unspecified: Secondary | ICD-10-CM | POA: Diagnosis not present

## 2016-12-04 LAB — COMPREHENSIVE METABOLIC PANEL
ALT: 19 U/L (ref 17–63)
ANION GAP: 8 (ref 5–15)
AST: 22 U/L (ref 15–41)
Albumin: 4.1 g/dL (ref 3.5–5.0)
Alkaline Phosphatase: 64 U/L (ref 38–126)
BUN: 16 mg/dL (ref 6–20)
CHLORIDE: 102 mmol/L (ref 101–111)
CO2: 28 mmol/L (ref 22–32)
Calcium: 8.5 mg/dL — ABNORMAL LOW (ref 8.9–10.3)
Creatinine, Ser: 0.87 mg/dL (ref 0.61–1.24)
GFR calc Af Amer: >60 mL/min (ref >60)
Glucose, Bld: 99 mg/dL (ref 65–99)
POTASSIUM: 4.1 mmol/L (ref 3.5–5.1)
Sodium: 138 mmol/L (ref 135–145)
TOTAL PROTEIN: 6.6 g/dL (ref 6.5–8.1)
Total Bilirubin: 0.8 mg/dL (ref 0.3–1.2)

## 2016-12-04 LAB — I-STAT TROPONIN, ED: TROPONIN I, POC: 0 ng/mL (ref 0.00–0.08)

## 2016-12-04 LAB — CBC
HCT: 45.3 % (ref 39.0–52.0)
Hemoglobin: 15.3 g/dL (ref 13.0–17.0)
MCH: 32.5 pg (ref 26.0–34.0)
MCHC: 33.8 g/dL (ref 30.0–36.0)
MCV: 96.2 fL (ref 78.0–100.0)
PLATELETS: 178 10*3/uL (ref 150–400)
RBC: 4.71 MIL/uL (ref 4.22–5.81)
RDW: 12.6 % (ref 11.5–15.5)
WBC: 7.5 10*3/uL (ref 4.0–10.5)

## 2016-12-04 LAB — RAPID URINE DRUG SCREEN, HOSP PERFORMED
Amphetamines: NOT DETECTED
Barbiturates: NOT DETECTED
Benzodiazepines: NOT DETECTED
Cocaine: NOT DETECTED
OPIATES: NOT DETECTED
Tetrahydrocannabinol: NOT DETECTED

## 2016-12-04 LAB — ETHANOL

## 2016-12-04 LAB — SALICYLATE LEVEL

## 2016-12-04 LAB — ACETAMINOPHEN LEVEL: Acetaminophen (Tylenol), Serum: <10 ug/mL — ABNORMAL LOW (ref 10–30)

## 2016-12-04 MED ORDER — MOMETASONE FURO-FORMOTEROL FUM 200-5 MCG/ACT IN AERO
2.0000 | INHALATION_SPRAY | Freq: Two times a day (BID) | RESPIRATORY_TRACT | Status: DC
  Administered 2016-12-04 – 2016-12-06 (×4): 2 via RESPIRATORY_TRACT
  Filled 2016-12-04: qty 8.8

## 2016-12-04 MED ORDER — ARIPIPRAZOLE 2 MG PO TABS
2.0000 mg | ORAL_TABLET | ORAL | Status: DC
  Administered 2016-12-05 – 2016-12-06 (×2): 2 mg via ORAL
  Filled 2016-12-04 (×2): qty 1

## 2016-12-04 MED ORDER — ESCITALOPRAM OXALATE 10 MG PO TABS
20.0000 mg | ORAL_TABLET | ORAL | Status: DC
  Administered 2016-12-05 – 2016-12-06 (×2): 20 mg via ORAL
  Filled 2016-12-04 (×2): qty 2

## 2016-12-04 MED ORDER — ALUM & MAG HYDROXIDE-SIMETH 200-200-20 MG/5ML PO SUSP: 30.0000 mL | Freq: Four times a day (QID) | ORAL | Status: DC | PRN

## 2016-12-04 MED ORDER — ONDANSETRON HCL 4 MG PO TABS: 4.0000 mg | ORAL_TABLET | Freq: Three times a day (TID) | ORAL | Status: DC | PRN

## 2016-12-04 MED ORDER — IBUPROFEN 200 MG PO TABS
600.0000 mg | ORAL_TABLET | Freq: Three times a day (TID) | ORAL | Status: DC | PRN
  Administered 2016-12-04 – 2016-12-05 (×2): 600 mg via ORAL
  Filled 2016-12-04 (×2): qty 3

## 2016-12-04 MED ORDER — PRAVASTATIN SODIUM 10 MG PO TABS
10.0000 mg | ORAL_TABLET | Freq: Every day | ORAL | Status: DC
  Administered 2016-12-04 – 2016-12-05 (×2): 10 mg via ORAL
  Filled 2016-12-04 (×2): qty 1

## 2016-12-04 MED ORDER — TIOTROPIUM BROMIDE MONOHYDRATE 18 MCG IN CAPS
18.0000 ug | ORAL_CAPSULE | Freq: Every day | RESPIRATORY_TRACT | Status: DC
  Administered 2016-12-05 – 2016-12-06 (×2): 18 ug via RESPIRATORY_TRACT
  Filled 2016-12-04: qty 5

## 2016-12-04 NOTE — ED Provider Notes (Signed)
[facility] DEPT Provider Note   CSN: [id number] Arrival date & time: 12/04/16  1050     History   Chief Complaint Chief Complaint  Patient presents with  . Suicidal  . Shortness of Breath    HPI Taylor [name] is a 61 y.o. male with a h/o of asthma, COPD and bipolar disorder who presents to the emergency department with multiple complaints.  He reports a constant, bilateral frontal headache, characterized as pressure-like, that began 1 week ago with associated nausea. He states that his headache is worsened by the "thoughts racing in his head".   He also complains of worsening dyspnea and lightheadedness with exertion x3 days. He reports that he has almost "passed out" several times over the last few days while having a bowel movement or getting into and out of the shower. He reports the dyspnea has been worse in the morning when he awakes.  He also complains of suicidal ideation over the last few days. He states he has thought of dropping a radio into the bathtub while he is in it. No HI, or auditory and visual hallucinations. No previous inpatient behavioral health admissions. He reports a h/o of an overdose attempt by taking a bottle of pills while drinking alcohol when he was a teenager. No other attempts.  He reports his symptoms have been exasperated recently due to financial concerns.  He is currently living in a hotel and has applied for disability, but it is not yet been approved.  He also has increasing worry and concerns regarding his health.  He was recently admitted to the hospital on October 2-4 for an acute COPD exacerbation and discharged with an oral prednisone taper, scheduled albuterol inhalers, and oral doxycycline.    He states "I have not smoked cigarettes in 3 years, but sometimes I have have a cigarette now and then."  Daily medicaqtions include Abilify 2 mg and Lexapro 20 mg.   The patient is a poor historian.   The  history is provided by the patient. No language interpreter was used.    Past Medical History:  Diagnosis Date  . Bipolar 1 disorder ([place]) 02/05/2012  . Cancer ([place]) 04/11/11   adenocarcinoma of colon, 7/19 nodes pos.FINISHED CHEMO/DR. SHERRILL  . COPD (chronic obstructive pulmonary disease) ([place])    SMOKER  . Depression    Bipolar disorder  . Full dentures   . Hemorrhoids   . Inguinal hernia    RIGHT- PAINFUL  . Prostate cancer ([place])    Gleason score = 7, supposed to have radiation therapy but he has not followed up  . Rib fractures    hx of  . Shortness of breath     Patient Active Problem List   Diagnosis Date Noted  . COPD with acute exacerbation ([place]) 10/22/2016  . Acute bronchitis due to human metapneumovirus 01/16/2013  . Alcohol abuse 01/09/2013  . COPD exacerbation ([place]) 01/09/2013  . Chest pain 01/09/2013  . Acute respiratory failure with hypoxia ([place]) 01/09/2013  . Smoker 12/16/2012  . Inguinal hernia unilateral, non-recurrent, right 05/01/2012  . Dyspepsia 02/05/2012  . Bipolar 1 disorder ([place]) 02/05/2012  . Depression 02/05/2012  . Hyperglycemia 02/04/2012  . COPD  GOLD III 02/03/2012  . Thrombocytopenia ([place]) 02/03/2012  . 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 COLON SURGERY  04/11/11   Sigmoid colectomy  . [place] Medications  Prior to Admission medications   Medication Sig Start Date End Date Taking? Authorizing Provider  albuterol (PROVENTIL HFA;VENTOLIN HFA) 108 (90 Base) MCG/ACT inhaler Inhale 1-2 puffs into the lungs every 6 (six) hours as needed for wheezing. 10/28/16  Yes Clent Demark, PA-C  albuterol (PROVENTIL) (2.5 MG/3ML) 0.083% nebulizer solution Take 3 mLs (2.5 mg total) by nebulization 3 (three) times daily. Use 3 times daily x 4 days, then every 6 hours as needed. 10/28/16  Yes Clent Demark, PA-C  ARIPiprazole (ABILIFY) 2 MG tablet Take 2 mg by  mouth every morning.    Yes [provider]  Aspirin-Salicylamide-Caffeine (BC HEADACHE POWDER PO) Take 1 packet daily as needed by mouth (HEADACHE).   Yes [provider]  escitalopram (LEXAPRO) 20 MG tablet Take 20 mg by mouth every morning.    Yes [provider]  lovastatin (MEVACOR) 20 MG tablet Take 1 tablet (20 mg total) by mouth at bedtime. 10/24/16  Yes Eugenie Filler, MD  mometasone-formoterol (DULERA) 200-5 MCG/ACT AERO INHALE 2 PUFFS INTO THE LUNGS FIRST THING IN THE MORNING THEN ANOTHER 2 PUFFS 12 HOURS LATER. 10/24/16  Yes Eugenie Filler, MD  SPIRIVA RESPIMAT 2.5 MCG/ACT AERS Inhale 2 puffs daily into the lungs. 10/28/16  Yes [provider]  fluticasone furoate-vilanterol (BREO ELLIPTA) 200-25 MCG/INH AEPB Inhale 1 puff into the lungs daily. Patient not taking: Reported on 12/04/2016 10/28/16   Clent Demark, PA-C  nicotine (NICODERM CQ - DOSED IN MG/24 HOURS) 14 mg/24hr patch Place 1 patch (14 mg total) onto the skin daily. Patient not taking: Reported on 12/04/2016 10/25/16   Eugenie Filler, MD  ondansetron (ZOFRAN ODT) 4 MG disintegrating tablet Take 1 tablet (4 mg total) by mouth every 8 (eight) hours as needed for nausea or vomiting. Patient not taking: Reported on 10/28/2016 10/18/16   Deno Etienne, DO  polyethylene glycol [facility] / GLYCOLAX) packet Take 17 g by mouth 2 (two) times daily. Patient not taking: Reported on 10/28/2016 10/24/16   Eugenie Filler, MD  predniSONE (DELTASONE) 20 MG tablet Take 1-3 tablets (20-60 mg total) by mouth daily before breakfast. Take 3 tablets (60mg ) daily x 2 days, then 2 tablets (40mg ) daily x 3 days, then 1 tablet (20mg ) daily x 3 days then stop. Patient not taking: Reported on 10/28/2016 10/25/16   Eugenie Filler, MD  protein supplement shake (PREMIER PROTEIN) LIQD Take 325 mLs (11 oz total) by mouth 2 (two) times daily between meals. Patient not taking: Reported on 10/28/2016 10/24/16    Eugenie Filler, MD  senna-docusate (SENOKOT-S) 8.6-50 MG tablet Take 1 tablet by mouth 2 (two) times daily. Patient not taking: Reported on 10/28/2016 10/24/16   Eugenie Filler, MD    Family History Family History  Problem Relation Age of Onset  . Heart disease Father   . Lung cancer Maternal Uncle        smoked    Social History Social History   Tobacco Use  . Smoking status: Current Every Day Smoker    Packs/day: 0.50    Years: 45.00    Pack years: 22.50    Types: Cigarettes    Last attempt to quit: 02/11/2014    Years since quitting: 2.8  . Smokeless tobacco: Former Network engineer Use Topics  . Alcohol use: No    Comment: h/o use in the past, no h/o heavy use  . Drug use: No     Allergies   Codeine  Review of Systems Review of Systems  Constitutional: Negative for activity change, chills and fever.  HENT: Negative for congestion.   Eyes: Negative for visual disturbance.  Respiratory: Positive for shortness of breath.   Cardiovascular: Negative for chest pain.  Gastrointestinal: Negative for abdominal pain.  Genitourinary: Negative for dysuria.  Musculoskeletal: Negative for back pain.  Skin: Negative for rash.  Neurological: Positive for light-headedness and headaches. Negative for seizures, weakness and numbness.  Psychiatric/Behavioral: Positive for suicidal ideas.   Physical Exam Updated Vital Signs BP 124/72 (BP Location: Left Arm)   Pulse 88   Temp 98.7 F (37.1 C) (Oral)   Resp 16   SpO2 94%   Physical Exam  Constitutional: He is oriented to person, place, and time. He appears well-developed.  HENT:  Head: Normocephalic and atraumatic.  Nose: Right sinus exhibits no maxillary sinus tenderness and no frontal sinus tenderness. Left sinus exhibits no maxillary sinus tenderness and no frontal sinus tenderness.  Eyes: Conjunctivae are normal. No scleral icterus.  Neck: Normal range of motion. Neck supple.  Cardiovascular: Normal rate,  regular rhythm and normal heart sounds. Exam reveals no gallop and no friction rub.  No murmur heard. Pulmonary/Chest: Effort normal and breath sounds normal. No stridor. No respiratory distress. He has no wheezes. He has no rales.  Abdominal: Soft. Bowel sounds are normal. He exhibits no distension. There is no tenderness.  Musculoskeletal: He exhibits no tenderness.  Neurological: He is alert and oriented to person, place, and time.  Cranial nerves 2-12 intact. Finger-to-nose is normal. 5/5 motor strength of the bilateral upper and lower extremities. Moves all four extremities. Negative Romberg. Ambulatory without difficulty. NVI.    Skin: Skin is warm and dry.  Psychiatric: His affect is blunt. His speech is delayed. He is withdrawn. He is not actively hallucinating. Cognition and memory are normal. He expresses suicidal ideation. He expresses no homicidal ideation. He expresses suicidal plans. He expresses no homicidal plans.  Nursing note and vitals reviewed.    ED Treatments / Results  Labs (all labs ordered are listed, but only abnormal results are displayed) Labs Reviewed  COMPREHENSIVE METABOLIC PANEL - Abnormal; Notable for the following components:      Result Value   Calcium 8.5 (*)    All other components within normal limits  ACETAMINOPHEN LEVEL - Abnormal; Notable for the following components:   Acetaminophen (Tylenol), Serum <10 (*)    All other components within normal limits  ETHANOL  SALICYLATE LEVEL  CBC  RAPID URINE DRUG SCREEN, HOSP PERFORMED  I-STAT TROPONIN, ED    EKG  EKG Interpretation None       Radiology Dg Chest 2 View  Result Date: 12/04/2016 CLINICAL DATA:  Chest pain and shortness of breath.  COPD. EXAM: CHEST  2 VIEW COMPARISON:  10/22/2016 and 05/22/2016 and 05/25/2016 and 10/18/2016 FINDINGS: The heart size and mediastinal contours are within normal limits. Both lungs are clear but hyperinflated consistent with COPD. No acute bone  abnormality. Old right rib fracture. Nipple shadow at the left lung base. IMPRESSION: No acute abnormalities. Hyperinflated lungs consistent with emphysema. Electronically Signed   By: Lorriane Shire M.D.   On: 12/04/2016 12:11   Ct Head Wo Contrast  Result Date: 12/04/2016 CLINICAL DATA:  Dizziness. Frontal headaches for 2-3 days. Personal history of colon cancer 4 or 5 years ago treated with chemotherapy. EXAM: CT HEAD WITHOUT CONTRAST TECHNIQUE: Contiguous axial images were obtained from the base of the skull through the vertex without intravenous  contrast. COMPARISON:  None. FINDINGS: Brain: No acute infarct, hemorrhage, or mass lesion is present. The ventricles are of normal size. No significant extraaxial fluid collection is present. Vascular: No hyperdense vessel or unexpected calcification. Skull: The calvarium is intact. No focal lytic or blastic lesions are present. Sinuses/Orbits: The paranasal sinuses and mastoid air cells are clear. The globes and orbits are within normal limits. IMPRESSION: Negative CT of the head. Electronically Signed   By: San Morelle M.D.   On: 12/04/2016 14:23    Procedures Procedures (including critical care time)  Medications Ordered in ED Medications  ARIPiprazole (ABILIFY) tablet 2 mg (not administered)  escitalopram (LEXAPRO) tablet 20 mg (not administered)  pravastatin (PRAVACHOL) tablet 10 mg (10 mg Oral Given 12/04/16 1931)  mometasone-formoterol (DULERA) 200-5 MCG/ACT inhaler 2 puff (2 puffs Inhalation Given 12/04/16 1932)  tiotropium (SPIRIVA) inhalation capsule 18 mcg (not administered)  ibuprofen (ADVIL,MOTRIN) tablet 600 mg (600 mg Oral Given 12/04/16 1743)  alum & mag hydroxide-simeth (MAALOX/MYLANTA) 200-200-20 MG/5ML suspension 30 mL (not administered)  ondansetron (ZOFRAN) tablet 4 mg (not administered)     Initial Impression / Assessment and Plan / ED Course  I have reviewed the triage vital signs and the nursing  notes.  Pertinent labs & imaging results that were available during my care of the patient were reviewed by me and considered in my medical decision making (see chart for details).     61 year old male presenting with multiple complaints including suicidal ideation plan, dyspnea, lightheadedness, and headache.  On physical exam, the patient has lungs clear to auscultation and normal neurological exam. CT head unremarkable.  Chest x-ray with hyperinflated lungs consistent with emphysema but no acute abnormalities.  No electrolyte abnormalities.  CBC is unremarkable.  At this time the patient is medically cleared.  Consulted TTS who recommended overnight observation and reevaluation in the morning. Psych hold orders and home med orders placed. Please see psych team notes for further documentation of care/dispo. Pt stable at time of med clearance.     Final Clinical Impressions(s) / ED Diagnoses   Final diagnoses:  Bipolar 1 disorder, depressed [facility])    ED Discharge Orders    None       Joanne Gavel, PA-C 12/05/16 0058    Isla Pence, MD 12/07/16 [phone]

## 2016-12-04 NOTE — ED Triage Notes (Signed)
Per EMS, pt complains of SI for the past 3 days. Pt is homeless, lives in hotel. Pt has hx of asthma, COPD, reports some shortness of breath. Pt had some relief with inhaler.

## 2016-12-04 NOTE — ED Notes (Signed)
Informed patient about needing a urine sample the next time needs to urinate.

## 2016-12-04 NOTE — BH Assessment (Signed)
Assessment Note  Taylor [name] is an 61 y.o. male that came to [facility] with increased depression and thoughts of suicide due to environmental stressors and declining health. He has been diagnosed with COPD and just got out of jail earlier this year. Pt states that he is a registered sex offender (registered 79) and didn't register in the last place he lived. The police found out and he went to prison for 30 months. Pt states that he has been living in a motel since he got out of prison in April. Pt states that he doesn't have access to some shelters because of his history. He states that he feels overwhelmed because he is not able to stay at the motel anymore and he has no where to go. Pt states that he has been feeling suicidal with a plan to "throw a radio in a bathtub". Pt denies any thought to harm others or AVH. Denies history of substance abuse.   Per Jinny Blossom NP pt is to be observed overnight for safety and stabilization.  Diagnosis: Bipolar 1 Disorder current episode depressed   Past Medical History:  Past Medical History:  Diagnosis Date  . Bipolar 1 disorder ([place]) 02/05/2012  . 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 SHERRILL  . COPD (chronic obstructive pulmonary disease) ([place])    SMOKER  . Depression    Bipolar disorder  . Full dentures   . Hemorrhoids   . Inguinal hernia    RIGHT- PAINFUL  . Prostate cancer ([place])    Gleason score = 7, supposed to have radiation therapy but he has not followed up  . Rib fractures    hx of  . Shortness of breath     Past Surgical History:  Procedure Laterality Date  . COLON SURGERY  04/11/11   Sigmoid colectomy  . PORTACATH PLACEMENT      Family History:  Family History  Problem Relation Age of Onset  . Heart disease Father   . Lung cancer Maternal Uncle        smoked    Social History:  reports that he has been smoking cigarettes.  He has a 22.50 pack-year smoking history. He has  quit using smokeless tobacco. He reports that he does not drink alcohol or use drugs.  Additional Social History:  Alcohol / Drug Use History of alcohol / drug use?: Yes Substance #1 Name of Substance 1: Alcohol  1 - Last Use / Amount: history of DUI   CIWA: CIWA-Ar BP: (!) 120/93 Pulse Rate: 85 COWS:    Allergies:  Allergies  Allergen Reactions  . Codeine Nausea And Vomiting    Home Medications:  (Not in a hospital admission)  OB/GYN Status:  No LMP for male patient.  General Assessment Data [facility] of [facility]: [facility] TTS Assessment: In system Is this a Tele or Face-to-Face Assessment?: Face-to-Face Is this an Initial Assessment or a Re-assessment for this encounter?: Initial Assessment Is patient pregnant?: No Pregnancy Status: No Living Arrangements: Alone Can pt return to current living arrangement?: Yes Admission Status: Voluntary Is patient capable of signing voluntary admission?: Yes Referral Source: Self/Family/Friend Insurance type: Self Pay     Crisis Care Plan Living Arrangements: Alone Name of Psychiatrist: Beverly Sessions Name of Therapist: Beverly Sessions  Education Status Is patient currently in school?: No  Risk to self with the past 6 months Suicidal Ideation: Yes-Currently Present Has patient been a risk to self within the past 6 months prior to  admission? : Yes Suicidal Intent: Yes-Currently Present Has patient had any suicidal intent within the past 6 months prior to admission? : Yes Is patient at risk for suicide?: Yes Suicidal Plan?: Yes-Currently Present Has patient had any suicidal plan within the past 6 months prior to admission? : Yes Specify Current Suicidal Plan: put a radio in a bathtub Access to Means: Yes Specify Access to Suicidal Means: NA What has been your use of drugs/alcohol within the last 12 months?: denies use Previous Attempts/Gestures: No How many times?: 0 Other Self Harm Risks: none Triggers for Past Attempts: None  known Intentional Self Injurious Behavior: None Family Suicide History: No Recent stressful life event(s): Financial Problems Persecutory voices/beliefs?: No Depression: Yes Depression Symptoms: Isolating, Guilt Substance abuse history and/or treatment for substance abuse?: No Suicide prevention information given to non-admitted patients: Not applicable  Risk to Others within the past 6 months Homicidal Ideation: No Does patient have any lifetime risk of violence toward others beyond the six months prior to admission? : No Thoughts of Harm to Others: No Current Homicidal Intent: No Current Homicidal Plan: No Access to Homicidal Means: No Identified Victim: None History of harm to others?: No Assessment of Violence: None Noted Violent Behavior Description: none Does patient have access to weapons?: No Criminal Charges Pending?: No Does patient have a court date: No Is patient on probation?: Yes  Psychosis Hallucinations: None noted Delusions: None noted  Mental Status Report Appearance/Hygiene: Unremarkable Eye Contact: Fair Motor Activity: Freedom of movement Speech: Logical/coherent Level of Consciousness: Alert Mood: Depressed Affect: Appropriate to circumstance Anxiety Level: None Thought Processes: Coherent Judgement: Impaired Orientation: Person, Place, Time, Situation Obsessive Compulsive Thoughts/Behaviors: None  Cognitive Functioning Concentration: Normal Memory: Recent Intact, Remote Intact IQ: Average Insight: Good Impulse Control: Good Appetite: Fair Weight Loss: 0 Weight Gain: 0 Sleep: Decreased Total Hours of Sleep: 6 Vegetative Symptoms: None  ADLScreening [facility] Assessment Services) Patient's cognitive ability adequate to safely complete daily activities?: Yes Patient able to express need for assistance with ADLs?: Yes Independently performs ADLs?: Yes (appropriate for developmental age)  Prior Inpatient Therapy Prior Inpatient Therapy:  No  Prior Outpatient Therapy Prior Outpatient Therapy: Yes Prior Therapy Facilty/Provider(s): [facility] Reason for Treatment: Medication management  Does patient have an ACCT team?: Yes Does patient have Intensive In-House Services?  : Yes Does patient have [facility] services? : Yes Does patient have P4CC services?: Yes  ADL Screening (condition at time of admission) Patient's cognitive ability adequate to safely complete daily activities?: Yes Is the patient deaf or have difficulty hearing?: No Does the patient have difficulty seeing, even when wearing glasses/contacts?: No Does the patient have difficulty concentrating, remembering, or making decisions?: No Patient able to express need for assistance with ADLs?: Yes Does the patient have difficulty dressing or bathing?: No Independently performs ADLs?: Yes (appropriate for developmental age) Does the patient have difficulty walking or climbing stairs?: No Weakness of Legs: None Weakness of Arms/Hands: None  Home Assistive Devices/Equipment Home Assistive Devices/Equipment: Nebulizer  Therapy Consults (therapy consults require a physician order) PT Evaluation Needed: No OT Evalulation Needed: No SLP Evaluation Needed: No   Values / Beliefs Cultural Requests During Hospitalization: None Spiritual Requests During Hospitalization: None Consults Spiritual Care Consult Needed: No Social Work Consult Needed: No Regulatory affairs officer (For Healthcare) Does Patient Have a Medical Advance Directive?: No Would patient like information on creating a medical advance directive?: No - Patient declined    Additional Information 1:1 In Past 12 Months?: No CIRT Risk: No  Elopement Risk: No Does patient have medical clearance?: Yes     Disposition:  Disposition Initial Assessment Completed for this Encounter: Yes Disposition of Patient: Other dispositions Other disposition(s): (observe overnight for safety and stabilization)  On Site  Evaluation by:   Reviewed with Physician:  Jinny Blossom NP   [place], [place]  12/04/2016 6:46 PM

## 2016-12-04 NOTE — ED Notes (Signed)
Spoke with pharmacy about verification of patients medication to administer.

## 2016-12-04 NOTE — ED Notes (Signed)
Patient currently denies SI/HI/AVH. Patient reports "i have racing thoughts and I need to be on some medications. Patient oriented to unit and soda given. Patient contracts for safety while on unit. Encouragement and support provided and safety maintain. Q 15 min safety checks in place and video monitoring.

## 2016-12-04 NOTE — ED Notes (Signed)
Bed: [facility] Expected date:  Expected time:  Means of arrival:  Comments: 

## 2016-12-04 NOTE — ED Notes (Signed)
Patient's care was transferred to [facility], [place] in [place]. Patient was wanded by security and ambulated to room 42 with a steady gait. Belongings given to SAPU.

## 2016-12-05 ENCOUNTER — Telehealth: Payer: Self-pay

## 2016-12-05 DIAGNOSIS — F319 Bipolar disorder, unspecified: Secondary | ICD-10-CM

## 2016-12-05 DIAGNOSIS — F330 Major depressive disorder, recurrent, mild: Secondary | ICD-10-CM | POA: Diagnosis present

## 2016-12-05 DIAGNOSIS — F17210 Nicotine dependence, cigarettes, uncomplicated: Secondary | ICD-10-CM

## 2016-12-05 MED ORDER — ALBUTEROL SULFATE HFA 108 (90 BASE) MCG/ACT IN AERS
1.0000 | INHALATION_SPRAY | Freq: Four times a day (QID) | RESPIRATORY_TRACT | Status: DC | PRN
  Administered 2016-12-06: 2 via RESPIRATORY_TRACT

## 2016-12-05 MED ORDER — ALBUTEROL SULFATE HFA 108 (90 BASE) MCG/ACT IN AERS
1.0000 | INHALATION_SPRAY | Freq: Four times a day (QID) | RESPIRATORY_TRACT | Status: DC | PRN
  Administered 2016-12-05: 2 via RESPIRATORY_TRACT
  Filled 2016-12-05: qty 6.7

## 2016-12-05 NOTE — Progress Notes (Signed)
12/05/16 1407:  LRT went to offer pt activities, pt was sleep.   Victorino Sparrow, LRT/CTRS

## 2016-12-05 NOTE — Consult Note (Signed)
[facility] Face-to-Face Psychiatry Consult    Patient Identification: Taylor [name] MRN:  [id number] Principal Diagnosis: Major depressive disorder, recurrent episode [facility]) Diagnosis:   Patient Active Problem List   Diagnosis Date Noted  . Major depressive disorder, recurrent episode ([place]) [F33.9] 12/05/2016  . COPD with acute exacerbation ([place]) [J44.1] 10/22/2016  . Acute bronchitis due to human metapneumovirus [J20.8] 01/16/2013  . Alcohol abuse [F10.10] 01/09/2013  . COPD exacerbation ([place]) [J44.1] 01/09/2013  . Chest pain [R07.9] 01/09/2013  . Acute respiratory failure with hypoxia ([place]) [J96.01] 01/09/2013  . Smoker [F17.200] 12/16/2012  . Inguinal hernia unilateral, non-recurrent, right [K40.90] 05/01/2012  . Dyspepsia [R10.13] 02/05/2012  . Bipolar 1 disorder ([place]) [F31.9] 02/05/2012  . Depression [F32.9] 02/05/2012  . Hyperglycemia [R73.9] 02/04/2012  . COPD  GOLD III [J44.9] 02/03/2012  . Thrombocytopenia ([place]) [D69.6] 02/03/2012  . 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 male patient admitted to the ER after he checked himself expressing that he had a plan to kill himself by putting a radio in the bathtub.  HPI:  Patient presents to the this ER following increased depression and anxiety over the last week culminating in suicidal ideation. Patient notes that he has been living in hotel rooms waiting on disability for COPD as he is on 3 different inhalers. Patient states that he has no money, no transportation, no social support, and tired of finding places to live where he does not have anything. Patient actively requesting to have himself committed. Patient notes that he can not go to the homeless shelter as he is a prior sex offender and there is a child care business nearby. Patient denies current suicidal ideations but did yesterday.  Medications adjusted and keep overnight for  safety.  Past Psychiatric History:  Patient verbalizes a history of anxiety and depression that he say he was receiving disability for. Patient taking Abilify and Lexapro prescribed at [facility] but has been out of medication for the last week.  Risk to Self: None Risk to Others: Homicidal Ideation: No Thoughts of Harm to Others: No Current Homicidal Intent: No Current Homicidal Plan: No Access to Homicidal Means: No Identified Victim: None History of harm to others?: No Assessment of Violence: None Noted Violent Behavior Description: none Does patient have access to weapons?: No Criminal Charges Pending?: No Does patient have a court date: No Prior Inpatient Therapy: Prior Inpatient Therapy: No Prior Outpatient Therapy: Prior Outpatient Therapy: Yes Prior Therapy Facilty/Provider(s): [facility] Reason for Treatment: Medication management  Does patient have an ACCT team?: Yes Does patient have Intensive In-House Services?  : Yes Does patient have [facility] services? : Yes Does patient have P4CC services?: Yes  Past Medical History:  Past Medical History:  Diagnosis Date  . Bipolar 1 disorder ([place]) 02/05/2012  . Cancer ([place]) 04/11/11   adenocarcinoma of colon, 7/19 nodes pos.FINISHED CHEMO/DR. SHERRILL  . COPD (chronic obstructive pulmonary disease) ([place])    SMOKER  . Depression    Bipolar disorder  . Full dentures   . Hemorrhoids   . Inguinal hernia    RIGHT- PAINFUL  . Prostate cancer ([place])    Gleason score = 7, supposed to have radiation therapy but he has not followed up  . Rib fractures    hx of  . Shortness of breath     Past Surgical History:  Procedure Laterality Date  . COLON SURGERY  04/11/11  Sigmoid colectomy  . COLOSTOMY REVISION  04/11/2011   Procedure: COLON RESECTION SIGMOID;  Surgeon: [name], MD;  Location: [facility];  Service: General;  Laterality: N/A;  low anterior colon resection   . INGUINAL HERNIA REPAIR Right 05/01/2012   Procedure: HERNIA REPAIR  INGUINAL ADULT;  Surgeon: [name], MD;  Location: [facility];  Service: General;  Laterality: Right;  . INSERTION OF MESH Right 05/01/2012   Procedure: INSERTION OF MESH;  Surgeon: [name], MD;  Location: [facility];  Service: General;  Laterality: Right;  . PORT-A-CATH REMOVAL Left 05/01/2012   Procedure: REMOVAL Infusion Port;  Surgeon: [name], MD;  Location: [facility];  Service: General;  Laterality: Left;  . PORTACATH PLACEMENT    . PORTACATH PLACEMENT  05/02/2011   Procedure: INSERTION PORT-A-CATH;  Surgeon: [name], MD;  Location: [facility];  Service: General;  Laterality: N/A;   Family History:  Family History  Problem Relation Age of Onset  . Heart disease Father   . Lung cancer Maternal Uncle        smoked    Social History:  Social History   Substance and Sexual Activity  Alcohol Use No   Comment: h/o use in the past, no h/o heavy use     Social History   Substance and Sexual Activity  Drug Use No    Social History   Socioeconomic History  . Marital status: Divorced    Spouse name: None  . Number of children: None  . Years of education: None  . Highest education level: None  Social Needs  . Financial resource strain: None  . Food insecurity - worry: None  . Food insecurity - inability: None  . Transportation needs - medical: None  . Transportation needs - non-medical: None  Occupational History  . Occupation: disability pending  Tobacco Use  . Smoking status: Current Every Day Smoker    Packs/day: 0.50    Years: 45.00    Pack years: 22.50    Types: Cigarettes    Last attempt to quit: 02/11/2014    Years since quitting: 2.8  . Smokeless tobacco: Former Network engineer and Sexual Activity  . Alcohol use: No    Comment: h/o use in the past, no h/o heavy use  . Drug use: No  . Sexual activity: No  Other Topics Concern  . None  Social History Narrative   Lives alone-divorced, disabled, in [place] 1970's   Brother, Surya Schroeter and his wife  Jackelyn Poling assist   Prior occupation: Clinical biochemist    Allergies:   Allergies  Allergen Reactions  . Codeine Nausea And Vomiting    Labs:  Results for orders placed or performed during the hospital encounter of 12/04/16 (from the past 48 hour(s))  Comprehensive metabolic panel     Status: Abnormal   Collection Time: 12/04/16  2:30 PM  Result Value Ref Range   Sodium 138 135 - 145 mmol/L   Potassium 4.1 3.5 - 5.1 mmol/L   Chloride 102 101 - 111 mmol/L   CO2 28 22 - 32 mmol/L   Glucose, Bld 99 65 - 99 mg/dL   BUN 16 6 - 20 mg/dL   Creatinine, Ser 0.87 0.61 - 1.24 mg/dL   Calcium 8.5 (L) 8.9 - 10.3 mg/dL   Total Protein 6.6 6.5 - 8.1 g/dL   Albumin 4.1 3.5 - 5.0 g/dL   AST 22 15 - 41 U/L   ALT 19 17 - 63  U/L   Alkaline Phosphatase 64 38 - 126 U/L   Total Bilirubin 0.8 0.3 - 1.2 mg/dL   GFR calc non Af Amer >60 >60 mL/min   GFR calc Af Amer >60 >60 mL/min    Comment: (NOTE) The eGFR has been calculated using the CKD EPI equation. This calculation has not been validated in all clinical situations. eGFR's persistently <60 mL/min signify possible Chronic Kidney Disease.    Anion gap 8 5 - 15  cbc     Status: None   Collection Time: 12/04/16  2:30 PM  Result Value Ref Range   WBC 7.5 4.0 - 10.5 K/uL   RBC 4.71 4.22 - 5.81 MIL/uL   Hemoglobin 15.3 13.0 - 17.0 g/dL   HCT 45.3 39.0 - 52.0 %   MCV 96.2 78.0 - 100.0 fL   MCH 32.5 26.0 - 34.0 pg   MCHC 33.8 30.0 - 36.0 g/dL   RDW 12.6 11.5 - 15.5 %   Platelets 178 150 - 400 K/uL  Ethanol     Status: None   Collection Time: 12/04/16  2:35 PM  Result Value Ref Range   Alcohol, Ethyl (B) <10 <10 mg/dL    Comment:        LOWEST DETECTABLE LIMIT FOR SERUM ALCOHOL IS 10 mg/dL FOR MEDICAL PURPOSES ONLY   Salicylate level     Status: None   Collection Time: 12/04/16  2:35 PM  Result Value Ref Range   Salicylate Lvl <6.7 2.8 - 30.0 mg/dL  Acetaminophen level     Status: Abnormal   Collection Time: 12/04/16  2:35 PM  Result Value  Ref Range   Acetaminophen (Tylenol), Serum <10 (L) 10 - 30 ug/mL    Comment:        THERAPEUTIC CONCENTRATIONS VARY SIGNIFICANTLY. A RANGE OF 10-30 ug/mL MAY BE AN EFFECTIVE CONCENTRATION FOR MANY PATIENTS. HOWEVER, SOME ARE BEST TREATED AT CONCENTRATIONS OUTSIDE THIS RANGE. ACETAMINOPHEN CONCENTRATIONS >150 ug/mL AT 4 HOURS AFTER INGESTION AND >50 ug/mL AT 12 HOURS AFTER INGESTION ARE OFTEN ASSOCIATED WITH TOXIC REACTIONS.   I-stat troponin, ED     Status: None   Collection Time: 12/04/16  2:40 PM  Result Value Ref Range   Troponin i, poc 0.00 0.00 - 0.08 ng/mL   Comment 3            Comment: Due to the release kinetics of cTnI, a negative result within the first hours of the onset of symptoms does not rule out myocardial infarction with certainty. If myocardial infarction is still suspected, repeat the test at appropriate intervals.   Rapid urine drug screen (hospital performed)     Status: None   Collection Time: 12/04/16  5:19 PM  Result Value Ref Range   Opiates NONE DETECTED NONE DETECTED   Cocaine NONE DETECTED NONE DETECTED   Benzodiazepines NONE DETECTED NONE DETECTED   Amphetamines NONE DETECTED NONE DETECTED   Tetrahydrocannabinol NONE DETECTED NONE DETECTED   Barbiturates NONE DETECTED NONE DETECTED    Comment:        DRUG SCREEN FOR MEDICAL PURPOSES ONLY.  IF CONFIRMATION IS NEEDED FOR ANY PURPOSE, NOTIFY LAB WITHIN 5 DAYS.        LOWEST DETECTABLE LIMITS FOR URINE DRUG SCREEN Drug Class       Cutoff (ng/mL) Amphetamine      1000 Barbiturate      200 Benzodiazepine   672 Tricyclics       094 Opiates  300 Cocaine          300 THC              50     Current Facility-Administered Medications  Medication Dose Route Frequency Provider Last Rate Last Dose  . albuterol (PROVENTIL HFA;VENTOLIN HFA) 108 (90 Base) MCG/ACT inhaler 1-2 puff  1-2 puff Inhalation Q6H PRN Keavon Sensing, MD      . alum & mag hydroxide-simeth (MAALOX/MYLANTA)  200-200-20 MG/5ML suspension 30 mL  30 mL Oral Q6H PRN McDonald, Mia A, PA-C      . ARIPiprazole (ABILIFY) tablet 2 mg  2 mg Oral BH-q7a McDonald, Mia A, PA-C   2 mg at 12/05/16 0620  . escitalopram (LEXAPRO) tablet 20 mg  20 mg Oral BH-q7a McDonald, Mia A, PA-C   20 mg at 12/05/16 6644  . ibuprofen (ADVIL,MOTRIN) tablet 600 mg  600 mg Oral Q8H PRN McDonald, Mia A, PA-C   600 mg at 12/04/16 1743  . mometasone-formoterol (DULERA) 200-5 MCG/ACT inhaler 2 puff  2 puff Inhalation BID McDonald, Mia A, PA-C   2 puff at 12/05/16 0825  . ondansetron (ZOFRAN) tablet 4 mg  4 mg Oral Q8H PRN McDonald, Mia A, PA-C      . pravastatin (PRAVACHOL) tablet 10 mg  10 mg Oral q1800 McDonald, Mia A, PA-C   10 mg at 12/04/16 1931  . tiotropium (SPIRIVA) inhalation capsule 18 mcg  18 mcg Inhalation Daily McDonald, Mia A, PA-C   18 mcg at 12/05/16 1022   Current Outpatient Medications  Medication Sig Dispense Refill  . albuterol (PROVENTIL HFA;VENTOLIN HFA) 108 (90 Base) MCG/ACT inhaler Inhale 1-2 puffs into the lungs every 6 (six) hours as needed for wheezing. 1 Inhaler 0  . albuterol (PROVENTIL) (2.5 MG/3ML) 0.083% nebulizer solution Take 3 mLs (2.5 mg total) by nebulization 3 (three) times daily. Use 3 times daily x 4 days, then every 6 hours as needed. 120 mL 0  . ARIPiprazole (ABILIFY) 2 MG tablet Take 2 mg by mouth every morning.     . Aspirin-Salicylamide-Caffeine (BC HEADACHE POWDER PO) Take 1 packet daily as needed by mouth (HEADACHE).    Marland Kitchen escitalopram (LEXAPRO) 20 MG tablet Take 20 mg by mouth every morning.     . lovastatin (MEVACOR) 20 MG tablet Take 1 tablet (20 mg total) by mouth at bedtime. 30 tablet 0  . mometasone-formoterol (DULERA) 200-5 MCG/ACT AERO INHALE 2 PUFFS INTO THE LUNGS FIRST THING IN THE MORNING THEN ANOTHER 2 PUFFS 12 HOURS LATER. 13 g 1  . SPIRIVA RESPIMAT 2.5 MCG/ACT AERS Inhale 2 puffs daily into the lungs.  1  . fluticasone furoate-vilanterol (BREO ELLIPTA) 200-25 MCG/INH AEPB Inhale  1 puff into the lungs daily. (Patient not taking: Reported on 12/04/2016) 1 each 11  . nicotine (NICODERM CQ - DOSED IN MG/24 HOURS) 14 mg/24hr patch Place 1 patch (14 mg total) onto the skin daily. (Patient not taking: Reported on 12/04/2016) 28 patch 0  . ondansetron (ZOFRAN ODT) 4 MG disintegrating tablet Take 1 tablet (4 mg total) by mouth every 8 (eight) hours as needed for nausea or vomiting. (Patient not taking: Reported on 10/28/2016) 20 tablet 0  . polyethylene glycol (MIRALAX / GLYCOLAX) packet Take 17 g by mouth 2 (two) times daily. (Patient not taking: Reported on 10/28/2016) 14 each 0  . predniSONE (DELTASONE) 20 MG tablet Take 1-3 tablets (20-60 mg total) by mouth daily before breakfast. Take 3 tablets (49m) daily x 2 days, then 2 tablets (  51m) daily x 3 days, then 1 tablet (284m daily x 3 days then stop. (Patient not taking: Reported on 10/28/2016) 15 tablet 0  . protein supplement shake (PREMIER PROTEIN) LIQD Take 325 mLs (11 oz total) by mouth 2 (two) times daily between meals. (Patient not taking: Reported on 10/28/2016)  0  . senna-docusate (SENOKOT-S) 8.6-50 MG tablet Take 1 tablet by mouth 2 (two) times daily. (Patient not taking: Reported on 10/28/2016)       Psychiatric Specialty Exam: Physical Exam  Constitutional: He is oriented to person, place, and time. He appears well-developed and well-nourished.  HENT:  Head: Normocephalic.  Neck: Normal range of motion.  Respiratory: Effort normal.  Musculoskeletal: Normal range of motion.  Neurological: He is alert and oriented to person, place, and time.  Psychiatric: His speech is normal. Thought content normal. His mood appears not anxious. His affect is blunt and labile. His affect is not angry and not inappropriate. He is slowed. He is not agitated, not aggressive and not actively hallucinating. Thought content is not paranoid and not delusional. Cognition and memory are normal. He expresses impulsivity. He exhibits a depressed  mood. He expresses no homicidal ideation. He expresses no suicidal plans and no homicidal plans. He is attentive.    Review of Systems  Psychiatric/Behavioral: Positive for depression.  All other systems reviewed and are negative.   Blood pressure 123/78, pulse 69, temperature 97.8 F (36.6 C), temperature source Oral, resp. rate 18, SpO2 94 %.There is no height or weight on file to calculate BMI.  General Appearance: Casual  Eye Contact:  Good  Speech:  Clear and Coherent  Volume:  Normal  Mood:  Sad  Affect:  Congruent and Depressed  Thought Process:  Coherent  Orientation:  Full (Time, Place, and Person)  Thought Content:  Logical  Suicidal Thoughts:  No  Homicidal Thoughts:  No  Memory:  Immediate;   Good Recent;   Good Remote;   Good  Judgement:  Impaired  Insight:  Fair  Psychomotor Activity:  Normal  Concentration:  Concentration: Good and Attention Span: Good  Recall:  Good  Fund of Knowledge:  Good  Language:  Good  Akathisia:  No  Handed:  Right  AIMS (if indicated):     Assets:  Communication Skills  ADL's:  Intact  Cognition:  WNL  Sleep:        Treatment Plan Summary: Daily contact with patient to assess and evaluate symptoms and progress in treatment and Medication management  Adjustment disorder with depressed mood: -Crisis stabilization -Medication management:  Medical medications restarted along with is Lexapro 20 mg daily for depression, increased his Abilify 2 mg daily to 5 mg for mood stabilization -Individual counseling  Disposition: Supportive therapy provided about ongoing stressors.  LOWaylan BogaNP 12/05/2016 1:51 PM  Patient seen face-to-face for psychiatric evaluation, chart reviewed and case discussed with the physician extender and developed treatment plan. Reviewed the information documented and agree with the treatment plan. MoCorena PilgrimMD

## 2016-12-05 NOTE — ED Notes (Signed)
Pt behavior cooperative. Flat affect. Denies SI/HI/AVH. Engorgement and support provided. Special checks q 15 mins in place for safety, Video monitoring in place. Will continue to monitor.

## 2016-12-05 NOTE — ED Notes (Signed)
Patient sleeping seen sleeping

## 2016-12-05 NOTE — Telephone Encounter (Signed)
Message received from Haze Justin, RN CM inquiring if the patient needs to meet with Financial Counseling.  This CM noted that the patient should still meet with Financial Counseling at [facility] to apply for the [place].  He can also schedule a follow up appointment at [facility] where he has been seen.

## 2016-12-05 NOTE — ED Notes (Addendum)
Patient woke up and complained of "shortness of breathe" 2 puff of scheduled Dulera inhaler administered. Patient denies pain, vital signs WDL. No sign of distress noted. Will continue to monitor patient.

## 2016-12-06 MED ORDER — ARIPIPRAZOLE 5 MG PO TABS: 5.0000 mg | ORAL_TABLET | ORAL | 0 refills | Status: DC

## 2016-12-06 MED ORDER — ARIPIPRAZOLE 5 MG PO TABS: 5.0000 mg | ORAL_TABLET | ORAL | Status: DC

## 2016-12-06 NOTE — BH Assessment (Signed)
[place] Assessment Progress Note  Per Corena Pilgrim, MD, this pt does not require psychiatric hospitalization at this time.  Pt is to be discharged from [facility] with recommendation to continue treatment with [facility].  This has been included in pt's discharge instructions.  Pt's nurse, Diane, has been notified.  Jalene Mullet, [place] Triage Specialist [phone]

## 2016-12-06 NOTE — Discharge Instructions (Addendum)
For your mental health needs, you are advised to continue treatment with [facility]: ° °     [facility] °     [address] °     [place], [place] [address] °     [phone] °

## 2016-12-06 NOTE — ED Notes (Signed)
Pt discharged home. Discharged instructions read to pt who verbalized understanding. All belongings returned to pt who signed for same. Denies SI/HI, is not delusional and not responding to internal stimuli. Escorted pt to the ED exit.    

## 2016-12-06 NOTE — BHH Suicide Risk Assessment (Signed)
Suicide Risk Assessment  Discharge Assessment   [facility] Discharge Suicide Risk Assessment   Principal Problem: Bipolar 1 disorder [facility]) Discharge Diagnoses:  Patient Active Problem List   Diagnosis Date Noted  . Bipolar 1 disorder ([place]) [F31.9] 02/05/2012    Priority: High  . Major depressive disorder, recurrent episode ([place]) [F33.9] 12/05/2016  . COPD with acute exacerbation ([place]) [J44.1] 10/22/2016  . Acute bronchitis due to human metapneumovirus [J20.8] 01/16/2013  . Alcohol abuse [F10.10] 01/09/2013  . COPD exacerbation ([place]) [J44.1] 01/09/2013  . Chest pain [R07.9] 01/09/2013  . Acute respiratory failure with hypoxia ([place]) [J96.01] 01/09/2013  . Smoker [F17.200] 12/16/2012  . Inguinal hernia unilateral, non-recurrent, right [K40.90] 05/01/2012  . Dyspepsia [R10.13] 02/05/2012  . Depression [F32.9] 02/05/2012  . Hyperglycemia [R73.9] 02/04/2012  . COPD  GOLD III [J44.9] 02/03/2012  . Thrombocytopenia ([place]) [D69.6] 02/03/2012  . Colon cancer, sigmoid [C18.9] 04/08/2011    Total Time spent with patient: 30 minutes  Musculoskeletal: Strength & Muscle Tone: within normal limits Gait & Station: normal Patient leans: N/A  Psychiatric Specialty Exam: Physical Exam  Constitutional: He is oriented to person, place, and time. He appears well-developed and well-nourished.  HENT:  Head: Normocephalic.  Neck: Normal range of motion.  Respiratory: Effort normal.  Musculoskeletal: Normal range of motion.  Neurological: He is alert and oriented to person, place, and time.  Psychiatric: His speech is normal and behavior is normal. Judgment and thought content normal. Cognition and memory are normal. He exhibits a depressed mood.    Review of Systems  Psychiatric/Behavioral: Positive for depression.  All other systems reviewed and are negative.   Blood pressure 118/78, pulse 71, temperature 98.1 F (36.7 C), temperature source Oral, resp. rate 19, SpO2 95 %.There is no height or  weight on file to calculate BMI.  General Appearance: Casual  Eye Contact:  Good  Speech:  Normal Rate  Volume:  Normal  Mood:  Depressed  Affect:  Congruent  Thought Process:  Coherent and Descriptions of Associations: Intact  Orientation:  Full (Time, Place, and Person)  Thought Content:  WDL and Logical  Suicidal Thoughts:  No  Homicidal Thoughts:  No  Memory:  Immediate;   Good Recent;   Good Remote;   Good  Judgement:  Fair  Insight:  Fair  Psychomotor Activity:  Normal  Concentration:  Concentration: Good and Attention Span: Good  Recall:  Good  Fund of Knowledge:  Good  Language:  Good  Akathisia:  No  Handed:  Right  AIMS (if indicated):     Assets:  Leisure Time Physical Health Resilience  ADL's:  Intact  Cognition:  WNL  Sleep:      Mental Status Per Nursing Assessment::   On Admission:   depression with suicidal ideations  Demographic Factors:  Male and Caucasian  Loss Factors: NA  Historical Factors: NA  Risk Reduction Factors:   Sense of responsibility to family  Continued Clinical Symptoms:  Depression and anxiety, mild  Cognitive Features That Contribute To Risk:  None    Suicide Risk:  Minimal: No identifiable suicidal ideation.  Patients presenting with no risk factors but with morbid ruminations; may be classified as minimal risk based on the severity of the depressive symptoms  Follow-up Information    Clent Demark, PA-C. Schedule an appointment as soon as possible for a visit.   Specialty:  Physician Assistant Why:  to see your doctor for follow up. Contact information: [place] [address] [phone]  [place]. Schedule an appointment as soon as possible for a visit.   Why:   for FINANCIAL COUNSELING to apply for orange card and cone discount.  You can use this locaiton for your PHARMACY needs too. Contact information: [place] [address] [phone]          Plan Of Care/Follow-up recommendations:  Activity:  as tolerated' Diet:  heart healthy diet  Arienna Benegas, NP 12/06/2016, 2:45 PM

## 2016-12-06 NOTE — Consult Note (Signed)
[facility] Consult   Reason for Consult:  Depression with suicidal ideations Referring Physician:  EDP Patient Identification: JOAOVICTOR [name] MRN:  [id number] Principal Diagnosis: Bipolar 1 disorder [facility]) Diagnosis:   Patient Active Problem List   Diagnosis Date Noted  . Bipolar 1 disorder ([place]) [F31.9] 02/05/2012    Priority: High  . Major depressive disorder, recurrent episode ([place]) [F33.9] 12/05/2016  . COPD with acute exacerbation ([place]) [J44.1] 10/22/2016  . Acute bronchitis due to human metapneumovirus [J20.8] 01/16/2013  . Alcohol abuse [F10.10] 01/09/2013  . COPD exacerbation ([place]) [J44.1] 01/09/2013  . Chest pain [R07.9] 01/09/2013  . Acute respiratory failure with hypoxia ([place]) [J96.01] 01/09/2013  . Smoker [F17.200] 12/16/2012  . Inguinal hernia unilateral, non-recurrent, right [K40.90] 05/01/2012  . Dyspepsia [R10.13] 02/05/2012  . Depression [F32.9] 02/05/2012  . Hyperglycemia [R73.9] 02/04/2012  . COPD  GOLD III [J44.9] 02/03/2012  . Thrombocytopenia ([place]) [D69.6] 02/03/2012  . 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 male patient does not warrant admission.  HPI:  61 yo male who presented to the ED with suicidal ideations.  He is stressed with waiting for disability and living in hotels.  His medications were adjusted and he stabilized.  Today, he denies suicidal/homicidal ideations,hallucinations, or substance abuse issues.  Referral to [facility].  Past Psychiatric History: bipolar disorder  Risk to Self: NOne Risk to Others: Homicidal Ideation: No Thoughts of Harm to Others: No Current Homicidal Intent: No Current Homicidal Plan: No Access to Homicidal Means: No Identified Victim: None History of harm to others?: No Assessment of Violence: None Noted Violent Behavior Description: none Does patient have access to weapons?: No Criminal Charges  Pending?: No Does patient have a court date: No Prior Inpatient Therapy: Prior Inpatient Therapy: No Prior Outpatient Therapy: Prior Outpatient Therapy: Yes Prior Therapy Facilty/Provider(s): [facility] Reason for Treatment: Medication management  Does patient have an ACCT team?: Yes Does patient have Intensive In-House Services?  : Yes Does patient have [facility] services? : Yes Does patient have P4CC services?: Yes  Past Medical History:  Past Medical History:  Diagnosis Date  . Bipolar 1 disorder ([place]) 02/05/2012  . 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 SHERRILL  . COPD (chronic obstructive pulmonary disease) ([place])    SMOKER  . Depression    Bipolar disorder  . Full dentures   . Hemorrhoids   . Inguinal hernia    RIGHT- PAINFUL  . Prostate cancer ([place])    Gleason score = 7, supposed to have radiation therapy but he has not followed up  . Rib fractures    hx of  . Shortness of breath     Past Surgical History:  Procedure Laterality Date  . COLON RESECTION SIGMOID N/A 04/11/2011   Performed by [name], MD at [facility] ORS  . COLON SURGERY  04/11/11   Sigmoid colectomy  . HERNIA REPAIR INGUINAL ADULT Right 05/01/2012   Performed by [name], MD at [facility] ORS  . INSERTION OF MESH Right 05/01/2012   Performed by [name], MD at [facility] ORS  . INSERTION PORT-A-CATH N/A 05/02/2011   Performed by [name], MD at [facility] ORS  . PORTACATH PLACEMENT    . REMOVAL Infusion Port Left 05/01/2012   Performed by [name], MD at [facility] ORS   Family History:  Family History  Problem  Relation Age of Onset  . Heart disease Father   . Lung cancer Maternal Uncle        smoked   Family Psychiatric  History: none Social History:  Social History   Substance and Sexual Activity  Alcohol Use No   Comment: h/o use in the past, no h/o heavy use     Social History   Substance and Sexual Activity  Drug Use No    Social History    Socioeconomic History  . Marital status: Divorced    Spouse name: None  . Number of children: None  . Years of education: None  . Highest education level: None  Social Needs  . Financial resource strain: None  . Food insecurity - worry: None  . Food insecurity - inability: None  . Transportation needs - medical: None  . Transportation needs - non-medical: None  Occupational History  . Occupation: disability pending  Tobacco Use  . Smoking status: Current Every Day Smoker    Packs/day: 0.50    Years: 45.00    Pack years: 22.50    Types: Cigarettes    Last attempt to quit: 02/11/2014    Years since quitting: 2.8  . Smokeless tobacco: Former Network engineer and Sexual Activity  . Alcohol use: No    Comment: h/o use in the past, no h/o heavy use  . Drug use: No  . Sexual activity: No  Other Topics Concern  . None  Social History Narrative   Lives alone-divorced, disabled, in [place] [address]   Brother, Huntley Knoop and his wife Jackelyn Poling assist   Prior occupation: Public relations account executive Social History:    Allergies:   Allergies  Allergen Reactions  . Codeine Nausea And Vomiting    Labs:  Results for orders placed or performed during the hospital encounter of 12/04/16 (from the past 48 hour(s))  Comprehensive metabolic panel     Status: Abnormal   Collection Time: 12/04/16  2:30 PM  Result Value Ref Range   Sodium 138 135 - 145 mmol/L   Potassium 4.1 3.5 - 5.1 mmol/L   Chloride 102 101 - 111 mmol/L   CO2 28 22 - 32 mmol/L   Glucose, Bld 99 65 - 99 mg/dL   BUN 16 6 - 20 mg/dL   Creatinine, Ser 0.87 0.61 - 1.24 mg/dL   Calcium 8.5 (L) 8.9 - 10.3 mg/dL   Total Protein 6.6 6.5 - 8.1 g/dL   Albumin 4.1 3.5 - 5.0 g/dL   AST 22 15 - 41 U/L   ALT 19 17 - 63 U/L   Alkaline Phosphatase 64 38 - 126 U/L   Total Bilirubin 0.8 0.3 - 1.2 mg/dL   GFR calc non Af Amer >60 >60 mL/min   GFR calc Af Amer >60 >60 mL/min    Comment: (NOTE) The eGFR has been calculated using the  CKD EPI equation. This calculation has not been validated in all clinical situations. eGFR's persistently <60 mL/min signify possible Chronic Kidney Disease.    Anion gap 8 5 - 15  cbc     Status: None   Collection Time: 12/04/16  2:30 PM  Result Value Ref Range   WBC 7.5 4.0 - 10.5 K/uL   RBC 4.71 4.22 - 5.81 MIL/uL   Hemoglobin 15.3 13.0 - 17.0 g/dL   HCT 45.3 39.0 - 52.0 %   MCV 96.2 78.0 - 100.0 fL   MCH 32.5 26.0 - 34.0 pg   MCHC 33.8 30.0 -  36.0 g/dL   RDW 12.6 11.5 - 15.5 %   Platelets 178 150 - 400 K/uL  Ethanol     Status: None   Collection Time: 12/04/16  2:35 PM  Result Value Ref Range   Alcohol, Ethyl (B) <10 <10 mg/dL    Comment:        LOWEST DETECTABLE LIMIT FOR SERUM ALCOHOL IS 10 mg/dL FOR MEDICAL PURPOSES ONLY   Salicylate level     Status: None   Collection Time: 12/04/16  2:35 PM  Result Value Ref Range   Salicylate Lvl <3.4 2.8 - 30.0 mg/dL  Acetaminophen level     Status: Abnormal   Collection Time: 12/04/16  2:35 PM  Result Value Ref Range   Acetaminophen (Tylenol), Serum <10 (L) 10 - 30 ug/mL    Comment:        THERAPEUTIC CONCENTRATIONS VARY SIGNIFICANTLY. A RANGE OF 10-30 ug/mL MAY BE AN EFFECTIVE CONCENTRATION FOR MANY PATIENTS. HOWEVER, SOME ARE BEST TREATED AT CONCENTRATIONS OUTSIDE THIS RANGE. ACETAMINOPHEN CONCENTRATIONS >150 ug/mL AT 4 HOURS AFTER INGESTION AND >50 ug/mL AT 12 HOURS AFTER INGESTION ARE OFTEN ASSOCIATED WITH TOXIC REACTIONS.   I-stat troponin, ED     Status: None   Collection Time: 12/04/16  2:40 PM  Result Value Ref Range   Troponin i, poc 0.00 0.00 - 0.08 ng/mL   Comment 3            Comment: Due to the release kinetics of cTnI, a negative result within the first hours of the onset of symptoms does not rule out myocardial infarction with certainty. If myocardial infarction is still suspected, repeat the test at appropriate intervals.   Rapid urine drug screen (hospital performed)     Status: None    Collection Time: 12/04/16  5:19 PM  Result Value Ref Range   Opiates NONE DETECTED NONE DETECTED   Cocaine NONE DETECTED NONE DETECTED   Benzodiazepines NONE DETECTED NONE DETECTED   Amphetamines NONE DETECTED NONE DETECTED   Tetrahydrocannabinol NONE DETECTED NONE DETECTED   Barbiturates NONE DETECTED NONE DETECTED    Comment:        DRUG SCREEN FOR MEDICAL PURPOSES ONLY.  IF CONFIRMATION IS NEEDED FOR ANY PURPOSE, NOTIFY LAB WITHIN 5 DAYS.        LOWEST DETECTABLE LIMITS FOR URINE DRUG SCREEN Drug Class       Cutoff (ng/mL) Amphetamine      1000 Barbiturate      200 Benzodiazepine   193 Tricyclics       790 Opiates          300 Cocaine          300 THC              50     Current Facility-Administered Medications  Medication Dose Route Frequency Provider Last Rate Last Dose  . albuterol (PROVENTIL HFA;VENTOLIN HFA) 108 (90 Base) MCG/ACT inhaler 1-2 puff  1-2 puff Inhalation Q6H PRN Corena Pilgrim, MD   2 puff at 12/06/16 0954  . alum & mag hydroxide-simeth (MAALOX/MYLANTA) 200-200-20 MG/5ML suspension 30 mL  30 mL Oral Q6H PRN McDonald, Mia A, PA-C      . ARIPiprazole (ABILIFY) tablet 2 mg  2 mg Oral BH-q7a McDonald, Mia A, PA-C   2 mg at 12/06/16 0647  . escitalopram (LEXAPRO) tablet 20 mg  20 mg Oral BH-q7a McDonald, Mia A, PA-C   20 mg at 12/06/16 0647  . ibuprofen (ADVIL,MOTRIN) tablet 600 mg  600  mg Oral Q8H PRN McDonald, Mia A, PA-C   600 mg at 12/05/16 1452  . mometasone-formoterol (DULERA) 200-5 MCG/ACT inhaler 2 puff  2 puff Inhalation BID McDonald, Mia A, PA-C   2 puff at 12/06/16 0830  . ondansetron (ZOFRAN) tablet 4 mg  4 mg Oral Q8H PRN McDonald, Mia A, PA-C      . pravastatin (PRAVACHOL) tablet 10 mg  10 mg Oral q1800 McDonald, Mia A, PA-C   10 mg at 12/05/16 1754  . tiotropium (SPIRIVA) inhalation capsule 18 mcg  18 mcg Inhalation Daily McDonald, Mia A, PA-C   18 mcg at 12/06/16 8882   Current Outpatient Medications  Medication Sig Dispense Refill  .  albuterol (PROVENTIL HFA;VENTOLIN HFA) 108 (90 Base) MCG/ACT inhaler Inhale 1-2 puffs into the lungs every 6 (six) hours as needed for wheezing. 1 Inhaler 0  . albuterol (PROVENTIL) (2.5 MG/3ML) 0.083% nebulizer solution Take 3 mLs (2.5 mg total) by nebulization 3 (three) times daily. Use 3 times daily x 4 days, then every 6 hours as needed. 120 mL 0  . ARIPiprazole (ABILIFY) 2 MG tablet Take 2 mg by mouth every morning.     . Aspirin-Salicylamide-Caffeine (BC HEADACHE POWDER PO) Take 1 packet daily as needed by mouth (HEADACHE).    Marland Kitchen escitalopram (LEXAPRO) 20 MG tablet Take 20 mg by mouth every morning.     . lovastatin (MEVACOR) 20 MG tablet Take 1 tablet (20 mg total) by mouth at bedtime. 30 tablet 0  . mometasone-formoterol (DULERA) 200-5 MCG/ACT AERO INHALE 2 PUFFS INTO THE LUNGS FIRST THING IN THE MORNING THEN ANOTHER 2 PUFFS 12 HOURS LATER. 13 g 1  . SPIRIVA RESPIMAT 2.5 MCG/ACT AERS Inhale 2 puffs daily into the lungs.  1  . fluticasone furoate-vilanterol (BREO ELLIPTA) 200-25 MCG/INH AEPB Inhale 1 puff into the lungs daily. (Patient not taking: Reported on 12/04/2016) 1 each 11  . nicotine (NICODERM CQ - DOSED IN MG/24 HOURS) 14 mg/24hr patch Place 1 patch (14 mg total) onto the skin daily. (Patient not taking: Reported on 12/04/2016) 28 patch 0  . ondansetron (ZOFRAN ODT) 4 MG disintegrating tablet Take 1 tablet (4 mg total) by mouth every 8 (eight) hours as needed for nausea or vomiting. (Patient not taking: Reported on 10/28/2016) 20 tablet 0  . polyethylene glycol (MIRALAX / GLYCOLAX) packet Take 17 g by mouth 2 (two) times daily. (Patient not taking: Reported on 10/28/2016) 14 each 0  . predniSONE (DELTASONE) 20 MG tablet Take 1-3 tablets (20-60 mg total) by mouth daily before breakfast. Take 3 tablets (68m) daily x 2 days, then 2 tablets (470m daily x 3 days, then 1 tablet (2018mdaily x 3 days then stop. (Patient not taking: Reported on 10/28/2016) 15 tablet 0  . protein supplement shake  (PREMIER PROTEIN) LIQD Take 325 mLs (11 oz total) by mouth 2 (two) times daily between meals. (Patient not taking: Reported on 10/28/2016)  0  . senna-docusate (SENOKOT-S) 8.6-50 MG tablet Take 1 tablet by mouth 2 (two) times daily. (Patient not taking: Reported on 10/28/2016)      Musculoskeletal: Strength & Muscle Tone: within normal limits Gait & Station: normal Patient leans: N/A  Psychiatric Specialty Exam: Physical Exam  Constitutional: He is oriented to person, place, and time. He appears well-developed and well-nourished.  HENT:  Head: Normocephalic.  Neck: Normal range of motion.  Respiratory: Effort normal.  Musculoskeletal: Normal range of motion.  Neurological: He is alert and oriented to person, place, and time.  Psychiatric: His speech is normal and behavior is normal. Judgment and thought content normal. Cognition and memory are normal. He exhibits a depressed mood.    Review of Systems  Psychiatric/Behavioral: Positive for depression.  All other systems reviewed and are negative.   Blood pressure 118/78, pulse 71, temperature 98.1 F (36.7 C), temperature source Oral, resp. rate 19, SpO2 95 %.There is no height or weight on file to calculate BMI.  General Appearance: Casual  Eye Contact:  Good  Speech:  Normal Rate  Volume:  Normal  Mood:  Depressed  Affect:  Congruent  Thought Process:  Coherent and Descriptions of Associations: Intact  Orientation:  Full (Time, Place, and Person)  Thought Content:  WDL and Logical  Suicidal Thoughts:  No  Homicidal Thoughts:  No  Memory:  Immediate;   Good Recent;   Good Remote;   Good  Judgement:  Fair  Insight:  Fair  Psychomotor Activity:  Normal  Concentration:  Concentration: Good and Attention Span: Good  Recall:  Good  Fund of Knowledge:  Good  Language:  Good  Akathisia:  No  Handed:  Right  AIMS (if indicated):     Assets:  Leisure Time Physical Health Resilience  ADL's:  Intact  Cognition:  WNL  Sleep:         Treatment Plan Summary: Daily contact with patient to assess and evaluate symptoms and progress in treatment, Medication management and Plan bipolar affective disorder, depressed, mild:  -Crisis stabilization -Medication management:  Continued Abilify 5 mg daily for depression (increased from 2 mg), Lexapro 20 mg daily for depression along with his medical medications -Individual counseling -[facility]erral to [facility]  Disposition: No evidence of imminent risk to self or others at present.    Waylan Boga, NP 12/06/2016 10:15 AM  Patient seen face-to-face for psychiatric evaluation, chart reviewed and case discussed with the physician extender and developed treatment plan. Reviewed the information documented and agree with the treatment plan. Corena Pilgrim, MD

## 2016-12-10 ENCOUNTER — Emergency Department (HOSPITAL_COMMUNITY): Payer: Medicaid Other

## 2016-12-10 ENCOUNTER — Encounter (HOSPITAL_COMMUNITY): Payer: Self-pay | Admitting: Emergency Medicine

## 2016-12-10 ENCOUNTER — Emergency Department (HOSPITAL_COMMUNITY)
Admission: EM | Admit: 2016-12-10 | Discharge: 2016-12-11 | Disposition: A | Discharge status: Home / Self Care | Payer: Medicaid Other | Attending: Emergency Medicine | Admitting: Emergency Medicine

## 2016-12-10 DIAGNOSIS — J441 Chronic obstructive pulmonary disease with (acute) exacerbation: Secondary | ICD-10-CM | POA: Insufficient documentation

## 2016-12-10 DIAGNOSIS — Z85038 Personal history of other malignant neoplasm of large intestine: Secondary | ICD-10-CM | POA: Insufficient documentation

## 2016-12-10 DIAGNOSIS — Z8546 Personal history of malignant neoplasm of prostate: Secondary | ICD-10-CM | POA: Diagnosis not present

## 2016-12-10 DIAGNOSIS — F17210 Nicotine dependence, cigarettes, uncomplicated: Secondary | ICD-10-CM | POA: Insufficient documentation

## 2016-12-10 DIAGNOSIS — Z79899 Other long term (current) drug therapy: Secondary | ICD-10-CM | POA: Diagnosis not present

## 2016-12-10 DIAGNOSIS — R079 Chest pain, unspecified: Secondary | ICD-10-CM | POA: Diagnosis present

## 2016-12-10 LAB — CBC
HEMATOCRIT: 46.1 % (ref 39.0–52.0)
HEMOGLOBIN: 15.5 g/dL (ref 13.0–17.0)
MCH: 32 pg (ref 26.0–34.0)
MCHC: 33.6 g/dL (ref 30.0–36.0)
MCV: 95.2 fL (ref 78.0–100.0)
Platelets: 230 10*3/uL (ref 150–400)
RBC: 4.84 MIL/uL (ref 4.22–5.81)
RDW: 12.8 % (ref 11.5–15.5)
WBC: 8.5 10*3/uL (ref 4.0–10.5)

## 2016-12-10 LAB — BASIC METABOLIC PANEL
ANION GAP: 9 (ref 5–15)
BUN: 17 mg/dL (ref 6–20)
CHLORIDE: 101 mmol/L (ref 101–111)
CO2: 28 mmol/L (ref 22–32)
Calcium: 9.3 mg/dL (ref 8.9–10.3)
Creatinine, Ser: 1.78 mg/dL — ABNORMAL HIGH (ref 0.61–1.24)
GFR calc Af Amer: 46 mL/min — ABNORMAL LOW (ref >60)
GFR, EST NON AFRICAN AMERICAN: 39 mL/min — AB (ref >60)
GLUCOSE: 138 mg/dL — AB (ref 65–99)
POTASSIUM: 4.2 mmol/L (ref 3.5–5.1)
Sodium: 138 mmol/L (ref 135–145)

## 2016-12-10 LAB — I-STAT TROPONIN, ED: Troponin i, poc: 0 ng/mL (ref 0.00–0.08)

## 2016-12-10 NOTE — ED Triage Notes (Signed)
Pt c/o centralized chest pain and shortness of breath x 1 hour. Hx COPD, states home inhalers not helping. Wheezing with EMS, given 5mg  albuterol, LS clear at this time. Given 324 aspirin and 1 NTG, with no change in pain. EMS vitals: BP 122/70

## 2016-12-11 ENCOUNTER — Encounter (HOSPITAL_COMMUNITY): Payer: Self-pay | Admitting: Emergency Medicine

## 2016-12-11 LAB — I-STAT CHEM 8, ED
BUN: 20 mg/dL (ref 6–20)
CALCIUM ION: 1.13 mmol/L — AB (ref 1.15–1.40)
CHLORIDE: 105 mmol/L (ref 101–111)
Creatinine, Ser: 1.4 mg/dL — ABNORMAL HIGH (ref 0.61–1.24)
GLUCOSE: 101 mg/dL — AB (ref 65–99)
HCT: 40 % (ref 39.0–52.0)
HEMOGLOBIN: 13.6 g/dL (ref 13.0–17.0)
POTASSIUM: 4.1 mmol/L (ref 3.5–5.1)
SODIUM: 141 mmol/L (ref 135–145)
TCO2: 27 mmol/L (ref 22–32)

## 2016-12-11 LAB — I-STAT TROPONIN, ED: Troponin i, poc: 0 ng/mL (ref 0.00–0.08)

## 2016-12-11 LAB — D-DIMER, QUANTITATIVE (NOT AT ARMC): D DIMER QUANT: 0.48 ug{FEU}/mL (ref 0.00–0.50)

## 2016-12-11 MED ORDER — DOXYCYCLINE HYCLATE 100 MG PO CAPS: 100.0000 mg | ORAL_CAPSULE | Freq: Two times a day (BID) | ORAL | 0 refills | Status: DC

## 2016-12-11 MED ORDER — PREDNISONE 20 MG PO TABS: ORAL_TABLET | ORAL | 0 refills | Status: DC

## 2016-12-11 MED ORDER — ALBUTEROL SULFATE HFA 108 (90 BASE) MCG/ACT IN AERS: 1.0000 | INHALATION_SPRAY | Freq: Four times a day (QID) | RESPIRATORY_TRACT | 0 refills | Status: DC | PRN

## 2016-12-11 MED ORDER — PREDNISONE 20 MG PO TABS
60.0000 mg | ORAL_TABLET | Freq: Once | ORAL | Status: AC
  Administered 2016-12-11: 60 mg via ORAL
  Filled 2016-12-11: qty 3

## 2016-12-11 MED ORDER — ALBUTEROL SULFATE (2.5 MG/3ML) 0.083% IN NEBU
2.5000 mg | INHALATION_SOLUTION | Freq: Once | RESPIRATORY_TRACT | Status: AC
  Administered 2016-12-11: 2.5 mg via RESPIRATORY_TRACT
  Filled 2016-12-11: qty 3

## 2016-12-11 MED ORDER — SODIUM CHLORIDE 0.9 % IV BOLUS (SEPSIS)
1000.0000 mL | Freq: Once | INTRAVENOUS | Status: AC
  Administered 2016-12-11: 1000 mL via INTRAVENOUS

## 2016-12-11 NOTE — ED Provider Notes (Signed)
[facility] EMERGENCY DEPARTMENT Provider Note   CSN: [id number] Arrival date & time: 12/10/16  2057     History   Chief Complaint Chief Complaint  Patient presents with  . Chest Pain    HPI EULON ALLNUTT is a 61 y.o. male.  The history is provided by the patient.  Chest Pain   This is a recurrent problem. The current episode started 12 to 24 hours ago. The problem has not changed since onset.The pain is associated with breathing. The pain is present in the substernal region. The pain is moderate. The quality of the pain is described as pressure-like. The pain does not radiate. The symptoms are aggravated by deep breathing. Pertinent negatives include no abdominal pain, no diaphoresis and no palpitations. He has tried nothing for the symptoms. The treatment provided no relief. Risk factors include being elderly and male gender.  Pertinent negatives for past medical history include no aneurysm.  Pertinent negatives for family medical history include: no early MI.  Procedure history is negative for EPS study.    Past Medical History:  Diagnosis Date  . Bipolar 1 disorder ([place]) 02/05/2012  . 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 SHERRILL  . COPD (chronic obstructive pulmonary disease) ([place])    SMOKER  . Depression    Bipolar disorder  . Full dentures   . Hemorrhoids   . Inguinal hernia    RIGHT- PAINFUL  . Prostate cancer ([place])    Gleason score = 7, supposed to have radiation therapy but he has not followed up  . Rib fractures    hx of  . Shortness of breath     Patient Active Problem List   Diagnosis Date Noted  . Major depressive disorder, recurrent episode ([place]) 12/05/2016  . COPD with acute exacerbation ([place]) 10/22/2016  . Acute bronchitis due to human metapneumovirus 01/16/2013  . Alcohol abuse 01/09/2013  . COPD exacerbation ([place]) 01/09/2013  . Chest pain 01/09/2013  . Acute respiratory  failure with hypoxia ([place]) 01/09/2013  . Smoker 12/16/2012  . Inguinal hernia unilateral, non-recurrent, right 05/01/2012  . Dyspepsia 02/05/2012  . Bipolar 1 disorder ([place]) 02/05/2012  . Depression 02/05/2012  . Hyperglycemia 02/04/2012  . COPD  GOLD III 02/03/2012  . Thrombocytopenia ([place]) 02/03/2012  . 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 COLON SURGERY  04/11/11   Sigmoid colectomy  . COLOSTOMY REVISION  04/11/2011   Procedure: COLON RESECTION SIGMOID;  Surgeon: [name], MD;  Location: [facility];  Service: General;  Laterality: N/A;  low anterior colon resection   . INGUINAL HERNIA REPAIR Right 05/01/2012   Procedure: HERNIA REPAIR INGUINAL ADULT;  Surgeon: [name], MD;  Location: [facility];  Service: General;  Laterality: Right;  . INSERTION OF MESH Right 05/01/2012   Procedure: INSERTION OF MESH;  Surgeon: [name], MD;  Location: [facility];  Service: General;  Laterality: Right;  . PORT-A-CATH REMOVAL Left 05/01/2012   Procedure: REMOVAL Infusion Port;  Surgeon: [name], MD;  Location: [facility];  Service: General;  Laterality: Left;  . PORTACATH PLACEMENT    . PORTACATH PLACEMENT  05/02/2011   Procedure: INSERTION PORT-A-CATH;  Surgeon: [name], MD;  Location: [facility];  Service: General;  Laterality: N/A;       Home Medications    Prior to Admission medications   Medication Sig Start Date End Date Taking?  Authorizing Provider  albuterol (PROVENTIL HFA;VENTOLIN HFA) 108 (90 Base) MCG/ACT inhaler Inhale 1-2 puffs into the lungs every 6 (six) hours as needed for wheezing. 10/28/16   Clent Demark, PA-C  albuterol (PROVENTIL) (2.5 MG/3ML) 0.083% nebulizer solution Take 3 mLs (2.5 mg total) by nebulization 3 (three) times daily. Use 3 times daily x 4 days, then every 6 hours as needed. 10/28/16   Clent Demark, PA-C  ARIPiprazole (ABILIFY) 5 MG tablet Take 1 tablet (5 mg total) every morning by mouth.  12/07/16   Patrecia Pour, NP  Aspirin-Salicylamide-Caffeine (BC HEADACHE POWDER PO) Take 1 packet daily as needed by mouth (HEADACHE).    [provider]  escitalopram (LEXAPRO) 20 MG tablet Take 20 mg by mouth every morning.     [provider]  lovastatin (MEVACOR) 20 MG tablet Take 1 tablet (20 mg total) by mouth at bedtime. 10/24/16   Eugenie Filler, MD  mometasone-formoterol (DULERA) 200-5 MCG/ACT AERO INHALE 2 PUFFS INTO THE LUNGS FIRST THING IN THE MORNING THEN ANOTHER 2 PUFFS 12 HOURS LATER. 10/24/16   Eugenie Filler, MD  SPIRIVA RESPIMAT 2.5 MCG/ACT AERS Inhale 2 puffs daily into the lungs. 10/28/16   [provider]    Family History Family History  Problem Relation Age of Onset  . Heart disease Father   . Lung cancer Maternal Uncle        smoked    Social History Social History   Tobacco Use  . Smoking status: Current Every Day Smoker    Packs/day: 0.50    Years: 45.00    Pack years: 22.50    Types: Cigarettes    Last attempt to quit: 02/11/2014    Years since quitting: 2.8  . Smokeless tobacco: Former Network engineer Use Topics  . Alcohol use: No    Comment: h/o use in the past, no h/o heavy use  . Drug use: No     Allergies   Codeine   Review of Systems Review of Systems  Constitutional: Negative for diaphoresis.  Respiratory: Positive for wheezing.   Cardiovascular: Positive for chest pain. Negative for palpitations and leg swelling.  Gastrointestinal: Negative for abdominal pain.  All other systems reviewed and are negative.    Physical Exam Updated Vital Signs BP 95/68 (BP Location: Right Arm)   Pulse (!) 118   Temp 97.9 F (36.6 C) (Oral)   Resp 18   Ht 5\' 11"  (1.803 m)   Wt 74.8 kg (165 lb)   SpO2 93%   BMI 23.01 kg/m   Physical Exam  Constitutional: He is oriented to person, place, and time. He appears well-developed and well-nourished.  HENT:  Head: Normocephalic and atraumatic.  Mouth/Throat: No  oropharyngeal exudate.  Eyes: Conjunctivae are normal. Pupils are equal, round, and reactive to light.  Neck: Normal range of motion. Neck supple. No JVD present.  Cardiovascular: Normal rate, regular rhythm, normal heart sounds and intact distal pulses.  Pulmonary/Chest: Effort normal and breath sounds normal. No stridor. He has no wheezes. He has no rales.  Abdominal: Soft. Bowel sounds are normal. He exhibits no mass. There is no tenderness. There is no rebound and no guarding.  Musculoskeletal: Normal range of motion.  Neurological: He is alert and oriented to person, place, and time. He displays normal reflexes.  Skin: Skin is warm and dry. Capillary refill takes less than 2 seconds. He is not diaphoretic.  Psychiatric: He has a normal mood and affect.  ED Treatments / Results  Labs (all labs ordered are listed, but only abnormal results are displayed)  Results for orders placed or performed during the hospital encounter of 16/10/96  Basic metabolic panel  Result Value Ref Range   Sodium 138 135 - 145 mmol/L   Potassium 4.2 3.5 - 5.1 mmol/L   Chloride 101 101 - 111 mmol/L   CO2 28 22 - 32 mmol/L   Glucose, Bld 138 (H) 65 - 99 mg/dL   BUN 17 6 - 20 mg/dL   Creatinine, Ser 1.78 (H) 0.61 - 1.24 mg/dL   Calcium 9.3 8.9 - 10.3 mg/dL   GFR calc non Af Amer 39 (L) >60 mL/min   GFR calc Af Amer 46 (L) >60 mL/min   Anion gap 9 5 - 15  CBC  Result Value Ref Range   WBC 8.5 4.0 - 10.5 K/uL   RBC 4.84 4.22 - 5.81 MIL/uL   Hemoglobin 15.5 13.0 - 17.0 g/dL   HCT 46.1 39.0 - 52.0 %   MCV 95.2 78.0 - 100.0 fL   MCH 32.0 26.0 - 34.0 pg   MCHC 33.6 30.0 - 36.0 g/dL   RDW 12.8 11.5 - 15.5 %   Platelets 230 150 - 400 K/uL  I-stat troponin, ED  Result Value Ref Range   Troponin i, poc 0.00 0.00 - 0.08 ng/mL   Comment 3           Dg Chest 2 View  Result Date: 12/10/2016 CLINICAL DATA:  Central chest pain and shortness of breath for 1 hour, history COPD, prostate cancer EXAM: CHEST   2 VIEW COMPARISON:  12/04/2016 FINDINGS: Normal heart size, mediastinal contours, and pulmonary vascularity. Emphysematous and mild bronchitic changes consistent with COPD. No acute infiltrate, pleural effusion or pneumothorax. Bones demineralized with old fractures of the posterolateral RIGHT third fourth and fifth ribs. No acute osseous findings. IMPRESSION: COPD changes without acute infiltrate. Electronically Signed   By: Lavonia Dana M.D.   On: 12/10/2016 21:25   Dg Chest 2 View  Result Date: 12/04/2016 CLINICAL DATA:  Chest pain and shortness of breath.  COPD. EXAM: CHEST  2 VIEW COMPARISON:  10/22/2016 and 05/22/2016 and 05/25/2016 and 10/18/2016 FINDINGS: The heart size and mediastinal contours are within normal limits. Both lungs are clear but hyperinflated consistent with COPD. No acute bone abnormality. Old right rib fracture. Nipple shadow at the left lung base. IMPRESSION: No acute abnormalities. Hyperinflated lungs consistent with emphysema. Electronically Signed   By: Lorriane Shire M.D.   On: 12/04/2016 12:11   Ct Head Wo Contrast  Result Date: 12/04/2016 CLINICAL DATA:  Dizziness. Frontal headaches for 2-3 days. Personal history of colon cancer 4 or 5 years ago treated with chemotherapy. EXAM: CT HEAD WITHOUT CONTRAST TECHNIQUE: Contiguous axial images were obtained from the base of the skull through the vertex without intravenous contrast. COMPARISON:  None. FINDINGS: Brain: No acute infarct, hemorrhage, or mass lesion is present. The ventricles are of normal size. No significant extraaxial fluid collection is present. Vascular: No hyperdense vessel or unexpected calcification. Skull: The calvarium is intact. No focal lytic or blastic lesions are present. Sinuses/Orbits: The paranasal sinuses and mastoid air cells are clear. The globes and orbits are within normal limits. IMPRESSION: Negative CT of the head. Electronically Signed   By: San Morelle M.D.   On: 12/04/2016 14:23     EKG  EKG Interpretation  Date/Time:  Tuesday December 10 2016 21:02:17 EST Ventricular Rate:  126 PR Interval:  130 QRS Duration: 82 QT Interval:  312 QTC Calculation: 451 R Axis:   -91 Text Interpretation:  Sinus tachycardia with occasional Premature ventricular complexes Right superior axis deviation Inferior infarct , age undetermined Anterior infarct , age undetermined Abnormal ECG When compared with ECG of 12/04/2016, No significant change was found Confirmed by Delora Fuel ([id number]) on 12/10/2016 9:07:39 PM       Radiology Dg Chest 2 View  Result Date: 12/10/2016 CLINICAL DATA:  Central chest pain and shortness of breath for 1 hour, history COPD, prostate cancer EXAM: CHEST  2 VIEW COMPARISON:  12/04/2016 FINDINGS: Normal heart size, mediastinal contours, and pulmonary vascularity. Emphysematous and mild bronchitic changes consistent with COPD. No acute infiltrate, pleural effusion or pneumothorax. Bones demineralized with old fractures of the posterolateral RIGHT third fourth and fifth ribs. No acute osseous findings. IMPRESSION: COPD changes without acute infiltrate. Electronically Signed   By: Lavonia Dana M.D.   On: 12/10/2016 21:25    Procedures Procedures (including critical care time)  Medications Ordered in ED  Medications  sodium chloride 0.9 % bolus 1,000 mL (0 mLs Intravenous Stopped 12/11/16 0241)  albuterol (PROVENTIL) (2.5 MG/3ML) 0.083% nebulizer solution 2.5 mg (2.5 mg Nebulization Given 12/11/16 0105)     Final Clinical Impressions(s) / ED Diagnoses  COPD exacerbation.  Ruled out for MI and PE in the ED.  Hydrated to bring creatinine down.  Will have PMD recheck on Friday. Drink copious fluids at home.    All questions answered to the patient's satisfaction.    Strict return precautions for fever, inability to open the mouth, inability to speech, swelling behind the ear or in the neck, fevers,  global weakness, vomiting, swelling or the lips or tongue,  chest pain, dyspnea on exertion,shortness of breath, persistent pain, Inability to tolerate liquids or food, cough, altered mental status or any concerns. No signs of systemic illness or infection. The patient is nontoxic-appearing on exam and vital signs are within normal limits.    I have reviewed the triage vital signs and the nursing notes. Pertinent labs &imaging results that were available during my care of the patient were reviewed by me and considered in my medical decision making (see chart for details).  After history, exam, and medical workup I feel the patient has been appropriately medically screened and is safe for discharge home. Pertinent diagnoses were discussed with the patient. Patient was given return precautions   Kalid Ghan, MD 12/11/16 2585

## 2016-12-12 ENCOUNTER — Other Ambulatory Visit: Payer: Self-pay

## 2016-12-12 ENCOUNTER — Encounter (HOSPITAL_COMMUNITY): Payer: Self-pay

## 2016-12-12 ENCOUNTER — Emergency Department (HOSPITAL_COMMUNITY)
Admission: EM | Admit: 2016-12-12 | Discharge: 2016-12-13 | Disposition: A | Discharge status: Home / Self Care | Payer: Medicaid Other | Attending: Emergency Medicine | Admitting: Emergency Medicine

## 2016-12-12 DIAGNOSIS — R0602 Shortness of breath: Secondary | ICD-10-CM | POA: Insufficient documentation

## 2016-12-12 DIAGNOSIS — F329 Major depressive disorder, single episode, unspecified: Secondary | ICD-10-CM | POA: Diagnosis present

## 2016-12-12 DIAGNOSIS — R45851 Suicidal ideations: Secondary | ICD-10-CM | POA: Insufficient documentation

## 2016-12-12 DIAGNOSIS — J441 Chronic obstructive pulmonary disease with (acute) exacerbation: Secondary | ICD-10-CM | POA: Insufficient documentation

## 2016-12-12 DIAGNOSIS — Z590 Homelessness: Secondary | ICD-10-CM | POA: Insufficient documentation

## 2016-12-12 DIAGNOSIS — F17210 Nicotine dependence, cigarettes, uncomplicated: Secondary | ICD-10-CM | POA: Insufficient documentation

## 2016-12-12 DIAGNOSIS — R4589 Other symptoms and signs involving emotional state: Secondary | ICD-10-CM

## 2016-12-12 DIAGNOSIS — Z79899 Other long term (current) drug therapy: Secondary | ICD-10-CM | POA: Diagnosis not present

## 2016-12-12 DIAGNOSIS — F330 Major depressive disorder, recurrent, mild: Secondary | ICD-10-CM | POA: Diagnosis not present

## 2016-12-12 DIAGNOSIS — Z8546 Personal history of malignant neoplasm of prostate: Secondary | ICD-10-CM | POA: Diagnosis not present

## 2016-12-12 LAB — RAPID URINE DRUG SCREEN, HOSP PERFORMED
AMPHETAMINES: NOT DETECTED
BARBITURATES: NOT DETECTED
BENZODIAZEPINES: NOT DETECTED
COCAINE: NOT DETECTED
OPIATES: NOT DETECTED
TETRAHYDROCANNABINOL: NOT DETECTED

## 2016-12-12 LAB — CBC WITH DIFFERENTIAL/PLATELET
BASOS PCT: 0 %
Basophils Absolute: 0 10*3/uL (ref 0.0–0.1)
EOS ABS: 0.1 10*3/uL (ref 0.0–0.7)
EOS PCT: 1 %
HCT: 42.5 % (ref 39.0–52.0)
Hemoglobin: 14.3 g/dL (ref 13.0–17.0)
Lymphocytes Relative: 16 %
Lymphs Abs: 1 10*3/uL (ref 0.7–4.0)
MCH: 32.3 pg (ref 26.0–34.0)
MCHC: 33.6 g/dL (ref 30.0–36.0)
MCV: 95.9 fL (ref 78.0–100.0)
MONO ABS: 0.3 10*3/uL (ref 0.1–1.0)
MONOS PCT: 4 %
Neutro Abs: 5.1 10*3/uL (ref 1.7–7.7)
Neutrophils Relative %: 79 %
Platelets: 192 10*3/uL (ref 150–400)
RBC: 4.43 MIL/uL (ref 4.22–5.81)
RDW: 12.8 % (ref 11.5–15.5)
WBC: 6.5 10*3/uL (ref 4.0–10.5)

## 2016-12-12 LAB — COMPREHENSIVE METABOLIC PANEL
ALBUMIN: 4.3 g/dL (ref 3.5–5.0)
ALT: 24 U/L (ref 17–63)
ANION GAP: 7 (ref 5–15)
AST: 29 U/L (ref 15–41)
Alkaline Phosphatase: 61 U/L (ref 38–126)
BUN: 17 mg/dL (ref 6–20)
CHLORIDE: 104 mmol/L (ref 101–111)
CO2: 28 mmol/L (ref 22–32)
Calcium: 8.4 mg/dL — ABNORMAL LOW (ref 8.9–10.3)
Creatinine, Ser: 0.81 mg/dL (ref 0.61–1.24)
GFR calc non Af Amer: >60 mL/min (ref >60)
Glucose, Bld: 94 mg/dL (ref 65–99)
POTASSIUM: 4.3 mmol/L (ref 3.5–5.1)
SODIUM: 139 mmol/L (ref 135–145)
Total Bilirubin: 0.5 mg/dL (ref 0.3–1.2)
Total Protein: 6.7 g/dL (ref 6.5–8.1)

## 2016-12-12 LAB — BRAIN NATRIURETIC PEPTIDE: B NATRIURETIC PEPTIDE 5: 63.3 pg/mL (ref 0.0–100.0)

## 2016-12-12 LAB — ETHANOL

## 2016-12-12 MED ORDER — DOXYCYCLINE HYCLATE 100 MG PO TABS
100.0000 mg | ORAL_TABLET | Freq: Two times a day (BID) | ORAL | Status: DC
  Administered 2016-12-12 – 2016-12-13 (×3): 100 mg via ORAL
  Filled 2016-12-12 (×3): qty 1

## 2016-12-12 MED ORDER — KETOROLAC TROMETHAMINE 15 MG/ML IJ SOLN: 15.0000 mg | Freq: Once | INTRAMUSCULAR | Status: DC

## 2016-12-12 MED ORDER — ESCITALOPRAM OXALATE 10 MG PO TABS
20.0000 mg | ORAL_TABLET | ORAL | Status: DC
  Administered 2016-12-13: 20 mg via ORAL
  Filled 2016-12-12: qty 2

## 2016-12-12 MED ORDER — PREDNISONE 20 MG PO TABS
60.0000 mg | ORAL_TABLET | Freq: Once | ORAL | Status: AC
  Administered 2016-12-12: 60 mg via ORAL
  Filled 2016-12-12: qty 3

## 2016-12-12 MED ORDER — TIOTROPIUM BROMIDE MONOHYDRATE 2.5 MCG/ACT IN AERS: 2.0000 | INHALATION_SPRAY | Freq: Every day | RESPIRATORY_TRACT | Status: DC

## 2016-12-12 MED ORDER — MOMETASONE FURO-FORMOTEROL FUM 200-5 MCG/ACT IN AERO
2.0000 | INHALATION_SPRAY | Freq: Two times a day (BID) | RESPIRATORY_TRACT | Status: DC
  Administered 2016-12-12 – 2016-12-13 (×2): 2 via RESPIRATORY_TRACT
  Filled 2016-12-12: qty 8.8

## 2016-12-12 MED ORDER — ALBUTEROL (5 MG/ML) CONTINUOUS INHALATION SOLN: 10.0000 mg/h | INHALATION_SOLUTION | Freq: Once | RESPIRATORY_TRACT | Status: DC

## 2016-12-12 MED ORDER — ACETAMINOPHEN 325 MG PO TABS
650.0000 mg | ORAL_TABLET | Freq: Once | ORAL | Status: AC
  Administered 2016-12-12: 650 mg via ORAL
  Filled 2016-12-12: qty 2

## 2016-12-12 MED ORDER — ACETAMINOPHEN 325 MG PO TABS
650.0000 mg | ORAL_TABLET | ORAL | Status: DC | PRN
  Administered 2016-12-12: 650 mg via ORAL
  Filled 2016-12-12: qty 2

## 2016-12-12 MED ORDER — ARIPIPRAZOLE 5 MG PO TABS
5.0000 mg | ORAL_TABLET | ORAL | Status: DC
  Administered 2016-12-13: 5 mg via ORAL
  Filled 2016-12-12: qty 1

## 2016-12-12 MED ORDER — ONDANSETRON HCL 4 MG PO TABS: 4.0000 mg | ORAL_TABLET | Freq: Three times a day (TID) | ORAL | Status: DC | PRN

## 2016-12-12 MED ORDER — ONDANSETRON 4 MG PO TBDP: 4.0000 mg | ORAL_TABLET | Freq: Once | ORAL | Status: DC

## 2016-12-12 MED ORDER — PREDNISONE 20 MG PO TABS
40.0000 mg | ORAL_TABLET | Freq: Every day | ORAL | Status: DC
  Administered 2016-12-13: 40 mg via ORAL
  Filled 2016-12-12: qty 2

## 2016-12-12 MED ORDER — IPRATROPIUM-ALBUTEROL 0.5-2.5 (3) MG/3ML IN SOLN
3.0000 mL | Freq: Once | RESPIRATORY_TRACT | Status: AC
Start: 2016-12-12 — End: 2016-12-12
  Administered 2016-12-12: 3 mL via RESPIRATORY_TRACT
  Filled 2016-12-12: qty 3

## 2016-12-12 MED ORDER — CEPHALEXIN 500 MG PO CAPS: 500.0000 mg | ORAL_CAPSULE | Freq: Once | ORAL | Status: DC

## 2016-12-12 MED ORDER — METHYLPREDNISOLONE SODIUM SUCC 125 MG IJ SOLR
125.0000 mg | Freq: Once | INTRAMUSCULAR | Status: AC
  Administered 2016-12-12: 125 mg via INTRAVENOUS
  Filled 2016-12-12: qty 2

## 2016-12-12 MED ORDER — DOXYCYCLINE HYCLATE 100 MG PO CAPS: 100.0000 mg | ORAL_CAPSULE | Freq: Two times a day (BID) | ORAL | Status: DC

## 2016-12-12 MED ORDER — TIOTROPIUM BROMIDE MONOHYDRATE 18 MCG IN CAPS
18.0000 ug | ORAL_CAPSULE | Freq: Every day | RESPIRATORY_TRACT | Status: DC
  Administered 2016-12-13: 18 ug via RESPIRATORY_TRACT
  Filled 2016-12-12 (×2): qty 5

## 2016-12-12 MED ORDER — ZOLPIDEM TARTRATE 5 MG PO TABS
5.0000 mg | ORAL_TABLET | Freq: Every evening | ORAL | Status: DC | PRN
Start: 2016-12-12 — End: 2016-12-13
  Administered 2016-12-12: 5 mg via ORAL
  Filled 2016-12-12: qty 1

## 2016-12-12 MED ORDER — ALBUTEROL (5 MG/ML) CONTINUOUS INHALATION SOLN
10.0000 mg/h | INHALATION_SOLUTION | Freq: Once | RESPIRATORY_TRACT | Status: AC
  Administered 2016-12-12: 10 mg/h via RESPIRATORY_TRACT
  Filled 2016-12-12: qty 20

## 2016-12-12 MED ORDER — MAGNESIUM SULFATE 2 GM/50ML IV SOLN
2.0000 g | Freq: Once | INTRAVENOUS | Status: DC
  Filled 2016-12-12: qty 50

## 2016-12-12 NOTE — ED Notes (Signed)
TTS at bedside. 

## 2016-12-12 NOTE — ED Triage Notes (Addendum)
Pt BIB EMS for shortness of breath, increasing with exertion. Pt used home inhaler and neb tx with no relief. Hx COPD, asthma. Lower lobe wheezes with EMS, given 5mg  albuterol, .5 atrovent, solumedrol 125 mg with some improvement. Pt C/o some chest tightness and HA, denies lightheadedness, N/V. Pt states he has recently smoked and lives with people who smoke around him.  Pt also expressing thoughts of hopelessness. Ptates he has thought about hurting self, but no active plan. Denies HI.

## 2016-12-12 NOTE — ED Provider Notes (Addendum)
[facility] DEPT Provider Note   CSN: [id number] Arrival date & time: 12/12/16  1127     History   Chief Complaint Chief Complaint  Patient presents with  . Shortness of Breath  . Suicidal    HPI [name] is a 61 y.o. male.  HPI  Patient with history of COPD, bipolar disorder, prostate cancer comes in with chief complaint of shortness of breath, worsening depression.  Patient reports that shortness of breath started getting worse 2 or 3 days ago.  Patient has dyspnea with minimal exertion.  Patient also has associated wheezing, despite taking his medications as prescribed.  Patient also has a cough which is producing green phlegm.  Patient denies any fevers, chills, hemoptysis.  Patient does have mild chest discomfort which is midsternal, and it is described as tightness.  Patient has no exertional chest pain.  Patient also reports that his depression is getting worse, and he is having suicidal ideations without an active plan.  Patient is also currently homeless, and does not have insurance.  Patient was incarcerated until about a month ago.  Past Medical History:  Diagnosis Date  . Bipolar 1 disorder ([facility]) 02/05/2012  . 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 SHERRILL  . COPD (chronic obstructive pulmonary disease) ([place])    SMOKER  . Depression    Bipolar disorder  . Full dentures   . Hemorrhoids   . Inguinal hernia    RIGHT- PAINFUL  . Prostate cancer ([place])    Gleason score = 7, supposed to have radiation therapy but he has not followed up  . Rib fractures    hx of  . Shortness of breath     Patient Active Problem List   Diagnosis Date Noted  . Major depressive disorder, recurrent episode ([place]) 12/05/2016  . COPD with acute exacerbation ([place]) 10/22/2016  . Acute bronchitis due to human metapneumovirus 01/16/2013  . Alcohol abuse 01/09/2013  . COPD exacerbation ([place]) 01/09/2013    . Chest pain 01/09/2013  . Acute respiratory failure with hypoxia ([place]) 01/09/2013  . Smoker 12/16/2012  . Inguinal hernia unilateral, non-recurrent, right 05/01/2012  . Dyspepsia 02/05/2012  . Bipolar 1 disorder ([place]) 02/05/2012  . Depression 02/05/2012  . Hyperglycemia 02/04/2012  . COPD  GOLD III 02/03/2012  . Thrombocytopenia ([place]) 02/03/2012  . 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 COLON SURGERY  04/11/11   Sigmoid colectomy  . COLOSTOMY REVISION  04/11/2011   Procedure: COLON RESECTION SIGMOID;  Surgeon: [name], MD;  Location: [facility];  Service: General;  Laterality: N/A;  low anterior colon resection   . INGUINAL HERNIA REPAIR Right 05/01/2012   Procedure: HERNIA REPAIR INGUINAL ADULT;  Surgeon: [name], MD;  Location: [facility];  Service: General;  Laterality: Right;  . INSERTION OF MESH Right 05/01/2012   Procedure: INSERTION OF MESH;  Surgeon: [name], MD;  [facility];  Service: General;  Laterality: Right;  . PORT-A-CATH REMOVAL Left 05/01/2012   Procedure: REMOVAL Infusion Port;  Surgeon: [name], MD;  Location: [facility];  Service: General;  Laterality: Left;  . PORTACATH PLACEMENT    . PORTACATH PLACEMENT  05/02/2011   Procedure: INSERTION PORT-A-CATH;  Surgeon: [name], MD;  Location: [facility];  Service: General;  Laterality: N/A;       Home Medications    Prior to Admission medications  Medication Sig Start Date End Date Taking? Authorizing Provider  acetaminophen (TYLENOL) 500 MG tablet Take 1,000 mg by mouth every 6 (six) hours as needed for headache.   Yes [provider]  albuterol (PROVENTIL HFA;VENTOLIN HFA) 108 (90 Base) MCG/ACT inhaler Inhale 1-2 puffs into the lungs every 6 (six) hours as needed for wheezing or shortness of breath. 12/11/16  Yes Palumbo, April, MD  albuterol (PROVENTIL) (2.5 MG/3ML) 0.083% nebulizer solution Take 3 mLs (2.5 mg total) by nebulization 3  (three) times daily. Use 3 times daily x 4 days, then every 6 hours as needed. 10/28/16  Yes Clent Demark, PA-C  ARIPiprazole (ABILIFY) 5 MG tablet Take 1 tablet (5 mg total) every morning by mouth. 12/07/16  Yes Lord, Asa Saunas, NP  escitalopram (LEXAPRO) 20 MG tablet Take 20 mg by mouth every morning.    Yes [provider]  lovastatin (MEVACOR) 20 MG tablet Take 1 tablet (20 mg total) by mouth at bedtime. 10/24/16  Yes Eugenie Filler, MD  mometasone-formoterol (DULERA) 200-5 MCG/ACT AERO INHALE 2 PUFFS INTO THE LUNGS FIRST THING IN THE MORNING THEN ANOTHER 2 PUFFS 12 HOURS LATER. Patient taking differently: Inhale 2 puffs into the lungs every 12 (twelve) hours.  10/24/16  Yes Eugenie Filler, MD  SPIRIVA RESPIMAT 2.5 MCG/ACT AERS Inhale 2 puffs daily into the lungs. 10/28/16  Yes [provider]  Aspirin-Salicylamide-Caffeine (BC HEADACHE POWDER PO) Take 1 packet daily as needed by mouth (HEADACHE).    [provider]  doxycycline (VIBRAMYCIN) 100 MG capsule Take 1 capsule (100 mg total) by mouth 2 (two) times daily. One po bid x 7 days 12/11/16   Palumbo, April, MD  predniSONE (DELTASONE) 20 MG tablet 3 tabs po day one, then 2 po daily x 4 days 12/11/16   Randal Buba, April, MD    Family History Family History  Problem Relation Age of Onset  . Heart disease Father   . Lung cancer Maternal Uncle        smoked    Social History Social History   Tobacco Use  . Smoking status: Current Every Day Smoker    Packs/day: 0.50    Years: 45.00    Pack years: 22.50    Types: Cigarettes    Last attempt to quit: 02/11/2014    Years since quitting: 2.8  . Smokeless tobacco: Former Network engineer Use Topics  . Alcohol use: No    Comment: h/o use in the past, no h/o heavy use  . Drug use: No     Allergies   Codeine   Review of Systems Review of Systems  Constitutional: Positive for activity change.  Respiratory: Positive for cough, shortness of breath  and wheezing.   Gastrointestinal: Negative for nausea.  Psychiatric/Behavioral: Positive for decreased concentration. The patient is nervous/anxious.   All other systems reviewed and are negative.    Physical Exam Updated Vital Signs BP 127/73 (BP Location: Left Arm)   Pulse 91   Temp 98.1 F (36.7 C) (Oral)   Resp 18   Ht 5\' 11"  (1.803 m)   Wt 74.8 kg (165 lb)   SpO2 96%   BMI 23.01 kg/m   Physical Exam  Constitutional: He is oriented to person, place, and time. He appears well-developed.  HENT:  Head: Normocephalic and atraumatic.  Eyes: Conjunctivae and EOM are normal. Pupils are equal, round, and reactive to light.  Neck: Normal range of motion. Neck supple. No JVD present.  Cardiovascular: Normal rate  and regular rhythm.  Pulmonary/Chest: Effort normal. He has wheezes in the right upper field and the left upper field.  Abdominal: Soft. Bowel sounds are normal. He exhibits no distension and no mass. There is no tenderness. There is no rebound and no guarding.  Musculoskeletal: He exhibits no deformity.       Right lower leg: He exhibits no tenderness and no edema.       Left lower leg: He exhibits no tenderness and no edema.  Neurological: He is alert and oriented to person, place, and time.  Skin: Skin is warm.  Nursing note and vitals reviewed.    ED Treatments / Results  Labs (all labs ordered are listed, but only abnormal results are displayed) Labs Reviewed  COMPREHENSIVE METABOLIC PANEL - Abnormal; Notable for the following components:      Result Value   Calcium 8.4 (*)    All other components within normal limits  CBC WITH DIFFERENTIAL/PLATELET  BRAIN NATRIURETIC PEPTIDE  ETHANOL  RAPID URINE DRUG SCREEN, HOSP PERFORMED    EKG  EKG Interpretation  Date/Time:  Thursday December 12 2016 11:37:00 EST Ventricular Rate:  92 PR Interval:    QRS Duration: 88 QT Interval:  352 QTC Calculation: 436 R Axis:   43 Text Interpretation:  Sinus rhythm  Borderline short PR interval Low voltage, precordial leads Nonspecific T abnormalities, lateral leads No acute changes Confirmed by Varney Biles [phone]) on 12/12/2016 2:14:40 PM       Radiology No results found.  Procedures Procedures (including critical care time)  Medications Ordered in ED Medications  acetaminophen (TYLENOL) tablet 650 mg (650 mg Oral Given 12/12/16 1932)  zolpidem (AMBIEN) tablet 5 mg (5 mg Oral Given 12/12/16 2248)  ondansetron (ZOFRAN) tablet 4 mg (not administered)  ARIPiprazole (ABILIFY) tablet 5 mg (5 mg Oral Given 12/13/16 0708)  escitalopram (LEXAPRO) tablet 20 mg (20 mg Oral Given 12/13/16 0708)  mometasone-formoterol (DULERA) 200-5 MCG/ACT inhaler 2 puff (2 puffs Inhalation Given 12/12/16 2249)  doxycycline (VIBRA-TABS) tablet 100 mg (100 mg Oral Given 12/12/16 2248)  predniSONE (DELTASONE) tablet 40 mg (40 mg Oral Given 12/13/16 0800)  albuterol (PROVENTIL,VENTOLIN) solution continuous neb (10 mg/hr Nebulization Not Given 12/12/16 1647)  tiotropium (SPIRIVA) inhalation capsule 18 mcg (not administered)  albuterol (PROVENTIL HFA;VENTOLIN HFA) 108 (90 Base) MCG/ACT inhaler 2 puff (not administered)  albuterol (PROVENTIL,VENTOLIN) solution continuous neb (10 mg/hr Nebulization Given 12/12/16 1405)  methylPREDNISolone sodium succinate (SOLU-MEDROL) 125 mg/2 mL injection 125 mg (125 mg Intravenous Given 12/12/16 1252)  ipratropium-albuterol (DUONEB) 0.5-2.5 (3) MG/3ML nebulizer solution 3 mL (3 mLs Nebulization Given 12/12/16 1344)  predniSONE (DELTASONE) tablet 60 mg (60 mg Oral Given 12/12/16 1531)  acetaminophen (TYLENOL) tablet 650 mg (650 mg Oral Given 12/12/16 1423)  albuterol (PROVENTIL) (2.5 MG/3ML) 0.083% nebulizer solution 5 mg (5 mg Nebulization Given 12/13/16 0834)  ipratropium (ATROVENT) nebulizer solution 0.5 mg (0.5 mg Nebulization Given 12/13/16 0834)     Initial Impression / Assessment and Plan / ED Course  I have reviewed the triage  vital signs and the nursing notes.  Pertinent labs & imaging results that were available during my care of the patient were reviewed by me and considered in my medical decision making (see chart for details).  Clinical Course as of Dec 14 907  Ludwig Clarks Dec 13, 2016  0908 Patient continues to require scheduled nebs. I assessed him this morning after he complained of shortness of breath.  By the time I assessed him he had received breathing treatment  and felt better.  Lungs are clear.  Doxycycline, prednisone are scheduled.  Regular home meds have been ordered.  Albuterol treatment also ordered every 4 hours.  [AN]    Clinical Course User Index [AN] Varney Biles, MD    Patient comes in with chief complaint of shortness of breath, worsening depression.  The patient was seen 2 days ago and had a normal chest x-ray.  Lung exam reveals diffuse wheezing consistent with COPD.  Patient reports he has been taking his medication as prescribed.  We will start him on albuterol treatment.  Patient will be needed to be started on doxycycline for 5 days given the new phlegm/cough with active smoking.  Patient also reports that his depression is getting worse.  He is having suicidal thoughts.  Patient is homeless as well.  Patient is not taking any medications due to lack of insurance and inability to follow-up with [facility].  Currently patient has suicidal thoughts without any active plans, however there is high propensity of him decompensating given that he has no meds for proper follow-up.  TTS will be consulted.  Patient is medically cleared.  Final Clinical Impressions(s) / ED Diagnoses   Final diagnoses:  Depressed affect  COPD exacerbation [facility])  Suicidal ideation    ED Discharge Orders    None       Varney Biles, MD 12/12/16 [place], [place], MD 12/13/16 [phone]

## 2016-12-12 NOTE — ED Notes (Signed)
Patient changing out into scrubs. Patient has 3 bags of belongings at the nursing station near room 5. Patient and his belongings have been wanded.

## 2016-12-12 NOTE — BH Assessment (Signed)
[place] Assessment Progress Note   Case was staffed with Reita Cliche DNP who recommended patient be monitored for safety and evaluated for medication management. Patient will be seen by psychiatry in the a.m.

## 2016-12-12 NOTE — ED Notes (Signed)
Pt given urinal and made aware urine sample is needed.  

## 2016-12-12 NOTE — ED Notes (Signed)
Bed: OI32 Expected date:  Expected time:  Means of arrival:  Comments: Hold for room 5

## 2016-12-12 NOTE — ED Notes (Signed)
Pt transported from [facility] room 5 to [facility] room 30 with RN in wheelchair. Pt with labored breathing noted and o2 sats 92% on Rm Air. Pt with scheduled inhaler ordered at this time. Will settle pt in bed and give scheduled medication to see if breathing improves.

## 2016-12-12 NOTE — ED Notes (Signed)
ED Provider at bedside. 

## 2016-12-12 NOTE — ED Notes (Signed)
Patient was given urinal and told to collect urine sample and call out when ready.

## 2016-12-12 NOTE — ED Notes (Signed)
Pt belongings was placed in locker 6. Pt had a duffle bag with miscellaneous items and two plastic bags with clothes and shoes.

## 2016-12-12 NOTE — BH Assessment (Addendum)
Assessment Note  Taylor [name] is an 61 y.o. male that presents this date with thoughts of self harm with a plan to overdose. Patient denies having access to any medications but reports he "could get some sleeping pills." Patient denies any prior attempts/gestures but states "he thinks about it a lot." Patient states he is currently homeless and has limited support. Patient denies any current SA use but reports has history of EToH use. Patient states she has been maintaining his sobriety for the last three years. Patient states he had been receiving services from [facility] who assists with medication management for Bipolar depression but reports he has not seen that provider in over six months. Per note review patient was seen at [facility] on 12/04/16 for similar symptoms associated with depression but did not follow up with aftercare due to not having transportation. Patient denies any current legal issues but reports he is currently on probation for a DUI. Patient reports ongoing depression with symptoms to include: feelings of guilt, isolating and feelings of worthlessness. Patient per note review, has a extensive history of  medical issues to include COPD. Per notes patient has been complaining of worsening breathing issues and lightheadedness with exertion for over one week.  He reports that he "passed out" before he arrived at [facility] this date.Patient also complains of suicidal ideation over the last few days. Patient denies any HI, or auditory/  visual hallucinations. No previous inpatient behavioral health admissions. Patient reports his symptoms have been exasperated recently due to financial concerns and being homeless. Patient states he is currently living in a hotel and has applied for disability, but it is not yet been approved. He also has increasing worry and concerns regarding his health. Per notes patient was recently  admitted to the hospital in October for his COPD. Patient is oriented to  time/place and presents with a depressed affect speaking in a low soft voice. Patient reports prior medications that included Abilify 2 mg and Lexapro 20 mg daily to assist with depression. Patient currently denies being on any medication regimen. Case was staffed with Reita Cliche DNP who recommended patient be monitored for safety and evaluated for medication management. Patient will be seen by psychiatry in the a.m.    Diagnosis: F31.32 Bipolar 1 most recent episode depressed  Past Medical History:  Past Medical History:  Diagnosis Date  . Bipolar 1 disorder ([place]) 02/05/2012  . 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 SHERRILL  . COPD (chronic obstructive pulmonary disease) ([place])    SMOKER  . Depression    Bipolar disorder  . Full dentures   . Hemorrhoids   . Inguinal hernia    RIGHT- PAINFUL  . Prostate cancer ([place])    Gleason score = 7, supposed to have radiation therapy but he has not followed up  . Rib fractures    hx of  . Shortness of breath     Past Surgical History:  Procedure Laterality Date  . COLON SURGERY  04/11/11   Sigmoid colectomy  . COLOSTOMY REVISION  04/11/2011   Procedure: COLON RESECTION SIGMOID;  Surgeon: [name], MD;  [facility];  Service: General;  Laterality: N/A;  low anterior colon resection   . INGUINAL HERNIA REPAIR Right 05/01/2012   Procedure: HERNIA REPAIR INGUINAL ADULT;  Surgeon: [name], MD;  [facility];  Service: General;  Laterality: Right;  . INSERTION OF MESH Right 05/01/2012   Procedure: INSERTION OF MESH;  Surgeon: [name], MD;  [facility];  Service: General;  Laterality: Right;  . PORT-A-CATH REMOVAL Left 05/01/2012   Procedure: REMOVAL Infusion Port;  Surgeon: [name], MD;  [facility];  Service: General;  Laterality: Left;  . PORTACATH PLACEMENT    . PORTACATH PLACEMENT  05/02/2011   Procedure: INSERTION PORT-A-CATH;  Surgeon: [name], MD;  [facility];  Service: General;  Laterality: N/A;    Family History:  Family History  Problem Relation Age of Onset  . Heart disease Father   . Lung cancer Maternal Uncle        smoked    Social History:  reports that he has been smoking cigarettes.  He has a 22.50 pack-year smoking history. He has quit using smokeless tobacco. He reports that he does not drink alcohol or use drugs.  Additional Social History:  Alcohol / Drug Use Pain Medications: See MAR Prescriptions: See MAR Over the Counter: See MAR History of alcohol / drug use?: Yes Longest period of sobriety (when/how long): Currently for last 3 years Negative Consequences of Use: (Denies) Withdrawal Symptoms: (Denies) Substance #1 Name of Substance 1: Alcohol  1 - Age of First Use: Unknown 1 - Amount (size/oz): Pt cannot remember over 3 years 1 - Frequency: Pt is currently in recovery 1 - Duration: Maintaining sobriety currently 1 - Last Use / Amount: 3 years ago  CIWA: CIWA-Ar BP: (!) 130/95 Pulse Rate: 79 COWS:    Allergies:  Allergies  Allergen Reactions  . Codeine Nausea And Vomiting    Home Medications:  (Not in a hospital admission)  OB/GYN Status:  No LMP for male patient.  General Assessment Data [facility]: [facility] TTS Assessment: In system Is this a Tele or Face-to-Face Assessment?: Face-to-Face Is this an Initial Assessment or a Re-assessment for this encounter?: Initial Assessment Marital status: Single Maiden name: NA Is patient pregnant?: No Pregnancy Status: No Living Arrangements: Alone Can pt return to current living arrangement?: Yes Admission Status: Voluntary Is patient capable of signing voluntary admission?: Yes Referral Source: Self/Family/Friend Insurance type: Self Pay  Medical Screening Exam ([place]) Medical Exam completed: Yes  Crisis Care Plan Living Arrangements: Alone Legal Guardian: Other:(NA) Name of Psychiatrist: None Name of Therapist:  None  Education Status Is patient currently in school?: No Current Grade: NA Highest grade of school patient has completed: 12 Name of school: (NA) Contact person: (NA)  Risk to self with the past 6 months Suicidal Ideation: Yes-Currently Present Has patient been a risk to self within the past 6 months prior to admission? : Yes Suicidal Intent: Yes-Currently Present Has patient had any suicidal intent within the past 6 months prior to admission? : Yes Is patient at risk for suicide?: Yes Suicidal Plan?: Yes-Currently Present Has patient had any suicidal plan within the past 6 months prior to admission? : Yes Specify Current Suicidal Plan: Overdose Access to Means: No Specify Access to Suicidal Means: Pt states he doesn't have medications but could "find some" What has been your use of drugs/alcohol within the last 12 months?: Denies current use Previous Attempts/Gestures: No How many times?: 0 Other Self Harm Risks: NA Triggers for Past Attempts: Unknown Intentional Self Injurious Behavior: None Family Suicide History: No Recent stressful life event(s): Other (Comment)(Homeless) Persecutory voices/beliefs?: No Depression: Yes Depression Symptoms: Feeling angry/irritable, Guilt Substance abuse history and/or treatment for substance abuse?: No Suicide prevention information given to non-admitted patients: Not applicable  Risk  to Others within the past 6 months Homicidal Ideation: No Does patient have any lifetime risk of violence toward others beyond the six months prior to admission? : No Thoughts of Harm to Others: No Current Homicidal Intent: No Current Homicidal Plan: No Access to Homicidal Means: No Identified Victim: NA History of harm to others?: No Assessment of Violence: None Noted Violent Behavior Description: NA Does patient have access to weapons?: No Criminal Charges Pending?: No Does patient have a court date: No Is patient on probation?:  Yes  Psychosis Hallucinations: None noted Delusions: None noted  Mental Status Report Appearance/Hygiene: Unremarkable Eye Contact: Fair Motor Activity: Unremarkable Speech: Logical/coherent Level of Consciousness: Alert Mood: Depressed, Anxious Affect: Appropriate to circumstance Anxiety Level: Moderate Thought Processes: Coherent, Relevant Judgement: Unimpaired Orientation: Person, Place, Time, Situation Obsessive Compulsive Thoughts/Behaviors: None  Cognitive Functioning Concentration: Decreased Memory: Recent Intact, Remote Intact IQ: Average Insight: Poor Impulse Control: Poor Appetite: Fair Weight Loss: 0 Weight Gain: 0 Sleep: Decreased Total Hours of Sleep: 6 Vegetative Symptoms: None  ADLScreening [facility] Assessment Services) Patient's cognitive ability adequate to safely complete daily activities?: Yes Patient able to express need for assistance with ADLs?: Yes Independently performs ADLs?: Yes (appropriate for developmental age)  Prior Inpatient Therapy Prior Inpatient Therapy: No Prior Therapy Dates: NA Prior Therapy Facilty/Provider(s): NA Reason for Treatment: (NA)  Prior Outpatient Therapy Prior Outpatient Therapy: Yes Prior Therapy Dates: 2018 Prior Therapy Facilty/Provider(s): [facility] Reason for Treatment: Med mang Does patient have an ACCT team?: No Does patient have Intensive In-House Services?  : No Does patient have [facility] services? : Yes Does patient have P4CC services?: No  ADL Screening (condition at time of admission) Patient's cognitive ability adequate to safely complete daily activities?: Yes Is the patient deaf or have difficulty hearing?: No Does the patient have difficulty seeing, even when wearing glasses/contacts?: No Does the patient have difficulty concentrating, remembering, or making decisions?: No Patient able to express need for assistance with ADLs?: Yes Does the patient have difficulty dressing or bathing?:  No Independently performs ADLs?: Yes (appropriate for developmental age) Does the patient have difficulty walking or climbing stairs?: No Weakness of Legs: None Weakness of Arms/Hands: None  Home Assistive Devices/Equipment Home Assistive Devices/Equipment: Nebulizer  Therapy Consults (therapy consults require a physician order) PT Evaluation Needed: No OT Evalulation Needed: No SLP Evaluation Needed: No Abuse/Neglect Assessment (Assessment to be complete while patient is alone) Physical Abuse: Denies Verbal Abuse: Denies Sexual Abuse: Denies Exploitation of patient/patient's resources: Denies Self-Neglect: Denies Values / Beliefs Cultural Requests During Hospitalization: Other (comment) Spiritual Requests During Hospitalization: None Consults Spiritual Care Consult Needed: No Social Work Consult Needed: No Regulatory affairs officer (For Healthcare) Does Patient Have a Medical Advance Directive?: No Would patient like information on creating a medical advance directive?: No - Patient declined    Additional Information 1:1 In Past 12 Months?: No CIRT Risk: No Elopement Risk: No Does patient have medical clearance?: Yes     Disposition:  Disposition Disposition of Patient: Other dispositions Other disposition(s): Other (Comment)(Observe and monitor, eval for med mang )  On Site Evaluation by:   Reviewed with Physician:    Mamie Nick 12/12/2016 3:15 PM

## 2016-12-12 NOTE — ED Notes (Signed)
Pt resting comfortably in bed. Breathing pattern less labored and pt states that breathing has improved. Will continue to monitor.

## 2016-12-12 NOTE — ED Notes (Signed)
Respiratory has been called for continuous neb tx

## 2016-12-12 NOTE — ED Notes (Signed)
Bed: SJ29 Expected date: 12/12/16 Expected time: 11:18 AM Means of arrival: Ambulance Comments:  [facility]

## 2016-12-13 DIAGNOSIS — F330 Major depressive disorder, recurrent, mild: Secondary | ICD-10-CM

## 2016-12-13 DIAGNOSIS — F17210 Nicotine dependence, cigarettes, uncomplicated: Secondary | ICD-10-CM

## 2016-12-13 MED ORDER — ALBUTEROL SULFATE HFA 108 (90 BASE) MCG/ACT IN AERS: 2.0000 | INHALATION_SPRAY | RESPIRATORY_TRACT | Status: DC | PRN

## 2016-12-13 MED ORDER — IPRATROPIUM BROMIDE 0.02 % IN SOLN
0.5000 mg | Freq: Once | RESPIRATORY_TRACT | Status: AC
  Administered 2016-12-13: 0.5 mg via RESPIRATORY_TRACT
  Filled 2016-12-13: qty 2.5

## 2016-12-13 MED ORDER — ALBUTEROL SULFATE (2.5 MG/3ML) 0.083% IN NEBU
5.0000 mg | INHALATION_SOLUTION | Freq: Once | RESPIRATORY_TRACT | Status: AC
  Administered 2016-12-13: 5 mg via RESPIRATORY_TRACT
  Filled 2016-12-13: qty 6

## 2016-12-13 MED ORDER — GI COCKTAIL ~~LOC~~
30.0000 mL | Freq: Once | ORAL | Status: AC
  Administered 2016-12-13: 30 mL via ORAL
  Filled 2016-12-13: qty 30

## 2016-12-13 NOTE — [facility] Suicide Risk Assessment (Signed)
Suicide Risk Assessment  Discharge Assessment   [facility] Discharge Suicide Risk Assessment   Principal Problem: Major depressive disorder, recurrent episode, mild ([place]) Discharge Diagnoses:  Patient Active Problem List   Diagnosis Date Noted  . Major depressive disorder, recurrent episode, mild ([place]) [F33.0] 12/05/2016    Priority: High  . COPD with acute exacerbation ([place]) [J44.1] 10/22/2016  . Acute bronchitis due to human metapneumovirus [J20.8] 01/16/2013  . Alcohol abuse [F10.10] 01/09/2013  . COPD exacerbation ([place]) [J44.1] 01/09/2013  . Chest pain [R07.9] 01/09/2013  . Acute respiratory failure with hypoxia ([place]) [J96.01] 01/09/2013  . Smoker [F17.200] 12/16/2012  . Inguinal hernia unilateral, non-recurrent, right [K40.90] 05/01/2012  . Dyspepsia [R10.13] 02/05/2012  . Hyperglycemia [R73.9] 02/04/2012  . COPD  GOLD III [J44.9] 02/03/2012  . Thrombocytopenia ([place]) [D69.6] 02/03/2012  . 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 He appears well-developed and well-nourished.  HENT:  Head: Normocephalic.  Neck: Normal range of motion.  Respiratory: Effort normal.  Musculoskeletal: Normal range of motion.  Neurological: He is alert and oriented to person, place, and time.  Psychiatric: His speech is normal and behavior is normal. Judgment and thought content normal. Cognition and memory are normal. He exhibits a depressed mood.    Review of Systems  Psychiatric/Behavioral: Positive for depression.  All other systems reviewed and are negative.   Blood pressure 127/73, pulse 95, temperature 98.3 F (36.8 C), temperature source Oral, resp. rate (!) 22, height 5\' 11"  (1.803 m), weight 74.8 kg (165 lb), SpO2 93 %.Body mass index  is 23.01 kg/m.  General Appearance: Casual  Eye Contact:  Good  Speech:  Normal Rate  Volume:  Normal  Mood:  Depressed, mild  Affect:  Congruent  Thought Process:  Coherent and Descriptions of Associations: Intact  Orientation:  Full (Time, Place, and Person)  Thought Content:  WDL and Logical  Suicidal Thoughts:  No  Homicidal Thoughts:  No  Memory:  Immediate;   Good Recent;   Good Remote;   Good  Judgement:  Fair  Insight:  Fair  Psychomotor Activity:  Normal  Concentration:  Concentration: Good and Attention Span: Good  Recall:  Good  Fund of Knowledge:  Good  Language:  Good  Akathisia:  No  Handed:  Right  AIMS (if indicated):     Assets:  Leisure Time Physical Health Resilience  ADL's:  Intact  Cognition:  WNL  Sleep:      Mental Status Per Nursing Assessment::   On Admission:   suicidal ideations   Demographic Factors:  Male, Caucasian and Living alone  Loss Factors: NA  Historical Factors: NA  Risk Reduction Factors:   Sense of responsibility to family and Positive therapeutic relationship  Continued Clinical Symptoms:  Depression, mild  Cognitive Features That Contribute To Risk:  None    Suicide Risk:  Minimal: No identifiable suicidal ideation.  Patients presenting with no risk factors but with morbid ruminations; may be classified as minimal risk based on the severity of the depressive symptoms    Plan Of Care/Follow-up recommendations:  Activity:  as tolerated Diet:  heart healthy diet  Adryen Cookson, NP 12/13/2016, 10:35 AM

## 2016-12-13 NOTE — ED Notes (Signed)
Plan is for pt to be discharged after lunch.

## 2016-12-13 NOTE — Consult Note (Signed)
[facility] Consult   Reason for Consult:  Depression with suicidal ideations Referring Physician:  EDP Patient Identification: Taylor [name] MRN:  [id number] Principal Diagnosis: Major depressive disorder, recurrent episode, mild ([place]) Diagnosis:   Patient Active Problem List   Diagnosis Date Noted  . Major depressive disorder, recurrent episode, mild ([place]) [F33.0] 12/05/2016    Priority: High  . COPD with acute exacerbation ([place]) [J44.1] 10/22/2016  . Acute bronchitis due to human metapneumovirus [J20.8] 01/16/2013  . Alcohol abuse [F10.10] 01/09/2013  . COPD exacerbation ([place]) [J44.1] 01/09/2013  . Chest pain [R07.9] 01/09/2013  . Acute respiratory failure with hypoxia ([place]) [J96.01] 01/09/2013  . Smoker [F17.200] 12/16/2012  . Inguinal hernia unilateral, non-recurrent, right [K40.90] 05/01/2012  . Dyspepsia [R10.13] 02/05/2012  . Hyperglycemia [R73.9] 02/04/2012  . COPD  GOLD III [J44.9] 02/03/2012  . Thrombocytopenia ([place]) [D69.6] 02/03/2012  . 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 male patient does not warrant admission.  HPI:  61 yo male who presented to the ED with suicidal ideations.  He is stressed with waiting for disability and living in hotels.  His medications were restarted and he stabilized.  No suicidal/homicidal ideations, hallucinations, or substance abuse issues.  Stable for discharge, encouraged to use his outpatient resources.  Past Psychiatric History: bipolar disorder  Risk to Self: NOne Risk to Others: Homicidal Ideation: No Thoughts of Harm to Others: No Current Homicidal Intent: No Current Homicidal Plan: No Access to Homicidal Means: No Identified Victim: NA History of harm to others?: No Assessment of Violence: None Noted Violent Behavior Description: NA Does patient have access to weapons?: No Criminal Charges Pending?:  No Does patient have a court date: No Prior Inpatient Therapy: Prior Inpatient Therapy: No Prior Therapy Dates: NA Prior Therapy Facilty/Provider(s): NA Reason for Treatment: (NA) Prior Outpatient Therapy: Prior Outpatient Therapy: Yes Prior Therapy Dates: 2018 Prior Therapy Facilty/Provider(s): [facility] Reason for Treatment: Med mang Does patient have an ACCT team?: No Does patient have Intensive In-House Services?  : No Does patient have [facility] services? : Yes Does patient have P4CC services?: No  Past Medical History:  Past Medical History:  Diagnosis Date  . Bipolar 1 disorder ([place]) 02/05/2012  . Cancer ([place]) 04/11/11   adenocarcinoma of colon, 7/19 nodes pos.FINISHED CHEMO/DR. SHERRILL  . COPD (chronic obstructive pulmonary disease) ([place])    SMOKER  . Depression    Bipolar disorder  . Full dentures   . Hemorrhoids   . Inguinal hernia    RIGHT- PAINFUL  . Prostate cancer ([place])    Gleason score = 7, supposed to have radiation therapy but he has not followed up  . Rib fractures    hx of  . Shortness of breath     Past Surgical History:  Procedure Laterality Date  . COLON SURGERY  04/11/11   Sigmoid colectomy  . COLOSTOMY REVISION  04/11/2011   Procedure: COLON RESECTION SIGMOID;  Surgeon: [name], MD;  Location: [facility];  Service: General;  Laterality: N/A;  low anterior colon resection   . INGUINAL HERNIA REPAIR Right 05/01/2012   Procedure: HERNIA REPAIR INGUINAL ADULT;  Surgeon: [name], MD;  Location: [facility];  Service: General;  Laterality: Right;  . INSERTION OF MESH Right 05/01/2012   Procedure: INSERTION OF MESH;  Surgeon: [name], MD;  Location: [facility];  Service: General;  Laterality: Right;  .  PORT-A-CATH REMOVAL Left 05/01/2012   Procedure: REMOVAL Infusion Port;  Surgeon: [name], MD;  Location: [facility];  Service: General;  Laterality: Left;  . PORTACATH PLACEMENT    . PORTACATH PLACEMENT  05/02/2011   Procedure: INSERTION PORT-A-CATH;   Surgeon: [name], MD;  Location: [facility];  Service: General;  Laterality: N/A;   Family History:  Family History  Problem Relation Age of Onset  . Heart disease Father   . Lung cancer Maternal Uncle        smoked   Family Psychiatric  History: none Social History:  Social History   Substance and Sexual Activity  Alcohol Use No   Comment: h/o use in the past, no h/o heavy use     Social History   Substance and Sexual Activity  Drug Use No    Social History   Socioeconomic History  . Marital status: Divorced    Spouse name: None  . Number of children: None  . Years of education: None  . Highest education level: None  Social Needs  . Financial resource strain: None  . Food insecurity - worry: None  . Food insecurity - inability: None  . Transportation needs - medical: None  . Transportation needs - non-medical: None  Occupational History  . Occupation: disability pending  Tobacco Use  . Smoking status: Current Every Day Smoker    Packs/day: 0.50    Years: 45.00    Pack years: 22.50    Types: Cigarettes    Last attempt to quit: 02/11/2014    Years since quitting: 2.8  . Smokeless tobacco: Former Network engineer and Sexual Activity  . Alcohol use: No    Comment: h/o use in the past, no h/o heavy use  . Drug use: No  . Sexual activity: No  Other Topics Concern  . None  Social History Narrative   Lives alone-divorced, disabled, in [place] 1970's   Brother, Zaveon Gillen and his wife Jackelyn Poling assist   Prior occupation: Public relations account executive Social History:    Allergies:   Allergies  Allergen Reactions  . Codeine Nausea And Vomiting    Labs:  Results for orders placed or performed during the hospital encounter of 12/12/16 (from the past 48 hour(s))  CBC with Differential/Platelet     Status: None   Collection Time: 12/12/16 12:48 PM  Result Value Ref Range   WBC 6.5 4.0 - 10.5 K/uL   RBC 4.43 4.22 - 5.81 MIL/uL   Hemoglobin 14.3 13.0 - 17.0 g/dL    HCT 42.5 39.0 - 52.0 %   MCV 95.9 78.0 - 100.0 fL   MCH 32.3 26.0 - 34.0 pg   MCHC 33.6 30.0 - 36.0 g/dL   RDW 12.8 11.5 - 15.5 %   Platelets 192 150 - 400 K/uL   Neutrophils Relative % 79 %   Neutro Abs 5.1 1.7 - 7.7 K/uL   Lymphocytes Relative 16 %   Lymphs Abs 1.0 0.7 - 4.0 K/uL   Monocytes Relative 4 %   Monocytes Absolute 0.3 0.1 - 1.0 K/uL   Eosinophils Relative 1 %   Eosinophils Absolute 0.1 0.0 - 0.7 K/uL   Basophils Relative 0 %   Basophils Absolute 0.0 0.0 - 0.1 K/uL  Comprehensive metabolic panel     Status: Abnormal   Collection Time: 12/12/16 12:48 PM  Result Value Ref Range   Sodium 139 135 - 145 mmol/L   Potassium 4.3 3.5 - 5.1 mmol/L  Chloride 104 101 - 111 mmol/L   CO2 28 22 - 32 mmol/L   Glucose, Bld 94 65 - 99 mg/dL   BUN 17 6 - 20 mg/dL   Creatinine, Ser 0.81 0.61 - 1.24 mg/dL   Calcium 8.4 (L) 8.9 - 10.3 mg/dL   Total Protein 6.7 6.5 - 8.1 g/dL   Albumin 4.3 3.5 - 5.0 g/dL   AST 29 15 - 41 U/L   ALT 24 17 - 63 U/L   Alkaline Phosphatase 61 38 - 126 U/L   Total Bilirubin 0.5 0.3 - 1.2 mg/dL   GFR calc non Af Amer >60 >60 mL/min   GFR calc Af Amer >60 >60 mL/min    Comment: (NOTE) The eGFR has been calculated using the CKD EPI equation. This calculation has not been validated in all clinical situations. eGFR's persistently <60 mL/min signify possible Chronic Kidney Disease.    Anion gap 7 5 - 15  Brain natriuretic peptide     Status: None   Collection Time: 12/12/16 12:48 PM  Result Value Ref Range   B Natriuretic Peptide 63.3 0.0 - 100.0 pg/mL  Ethanol     Status: None   Collection Time: 12/12/16 12:48 PM  Result Value Ref Range   Alcohol, Ethyl (B) <10 <10 mg/dL    Comment:        LOWEST DETECTABLE LIMIT FOR SERUM ALCOHOL IS 10 mg/dL FOR MEDICAL PURPOSES ONLY   Urine rapid drug screen (hosp performed)     Status: None   Collection Time: 12/12/16  1:40 PM  Result Value Ref Range   Opiates NONE DETECTED NONE DETECTED   Cocaine NONE  DETECTED NONE DETECTED   Benzodiazepines NONE DETECTED NONE DETECTED   Amphetamines NONE DETECTED NONE DETECTED   Tetrahydrocannabinol NONE DETECTED NONE DETECTED   Barbiturates NONE DETECTED NONE DETECTED    Comment:        DRUG SCREEN FOR MEDICAL PURPOSES ONLY.  IF CONFIRMATION IS NEEDED FOR ANY PURPOSE, NOTIFY LAB WITHIN 5 DAYS.        LOWEST DETECTABLE LIMITS FOR URINE DRUG SCREEN Drug Class       Cutoff (ng/mL) Amphetamine      1000 Barbiturate      200 Benzodiazepine   119 Tricyclics       417 Opiates          300 Cocaine          300 THC              50     Current Facility-Administered Medications  Medication Dose Route Frequency Provider Last Rate Last Dose  . acetaminophen (TYLENOL) tablet 650 mg  650 mg Oral Q4H PRN Varney Biles, MD   650 mg at 12/12/16 1932  . albuterol (PROVENTIL HFA;VENTOLIN HFA) 108 (90 Base) MCG/ACT inhaler 2 puff  2 puff Inhalation Q4H PRN Plunkett, Whitney, MD      . albuterol (PROVENTIL,VENTOLIN) solution continuous neb  10 mg/hr Nebulization Once Nanavati, Ankit, MD      . ARIPiprazole (ABILIFY) tablet 5 mg  5 mg Oral Anthony Sar, Ankit, MD   5 mg at 12/13/16 0708  . doxycycline (VIBRA-TABS) tablet 100 mg  100 mg Oral Q12H Nanavati, Ankit, MD   100 mg at 12/13/16 0931  . escitalopram (LEXAPRO) tablet 20 mg  20 mg Oral Anthony Sar, Ankit, MD   20 mg at 12/13/16 0708  . mometasone-formoterol (DULERA) 200-5 MCG/ACT inhaler 2 puff  2 puff Inhalation Q12H Nanavati, Ankit,  MD   2 puff at 12/13/16 0930  . ondansetron (ZOFRAN) tablet 4 mg  4 mg Oral Q8H PRN Nanavati, Ankit, MD      . predniSONE (DELTASONE) tablet 40 mg  40 mg Oral Q breakfast Kathrynn Humble, Ankit, MD   40 mg at 12/13/16 0800  . tiotropium (SPIRIVA) inhalation capsule 18 mcg  18 mcg Inhalation Daily Varney Biles, MD   18 mcg at 12/13/16 0930   Current Outpatient Medications  Medication Sig Dispense Refill  . acetaminophen (TYLENOL) 500 MG tablet Take 1,000 mg by mouth every  6 (six) hours as needed for headache.    . albuterol (PROVENTIL HFA;VENTOLIN HFA) 108 (90 Base) MCG/ACT inhaler Inhale 1-2 puffs into the lungs every 6 (six) hours as needed for wheezing or shortness of breath. 1 Inhaler 0  . albuterol (PROVENTIL) (2.5 MG/3ML) 0.083% nebulizer solution Take 3 mLs (2.5 mg total) by nebulization 3 (three) times daily. Use 3 times daily x 4 days, then every 6 hours as needed. 120 mL 0  . ARIPiprazole (ABILIFY) 5 MG tablet Take 1 tablet (5 mg total) every morning by mouth. 30 tablet 0  . escitalopram (LEXAPRO) 20 MG tablet Take 20 mg by mouth every morning.     . lovastatin (MEVACOR) 20 MG tablet Take 1 tablet (20 mg total) by mouth at bedtime. 30 tablet 0  . mometasone-formoterol (DULERA) 200-5 MCG/ACT AERO INHALE 2 PUFFS INTO THE LUNGS FIRST THING IN THE MORNING THEN ANOTHER 2 PUFFS 12 HOURS LATER. (Patient taking differently: Inhale 2 puffs into the lungs every 12 (twelve) hours. ) 13 g 1  . SPIRIVA RESPIMAT 2.5 MCG/ACT AERS Inhale 2 puffs daily into the lungs.  1  . Aspirin-Salicylamide-Caffeine (BC HEADACHE POWDER PO) Take 1 packet daily as needed by mouth (HEADACHE).    [place] doxycycline (VIBRAMYCIN) 100 MG capsule Take 1 capsule (100 mg total) by mouth 2 (two) times daily. One po bid x 7 days 14 capsule 0  . predniSONE (DELTASONE) 20 MG tablet 3 tabs po day one, then 2 po daily x 4 days 11 tablet 0    Musculoskeletal: Strength & Muscle Tone: within normal limits Gait & Station: normal Patient leans: N/A  Psychiatric Specialty Exam: Physical Exam  Constitutional: He is oriented to person, place, and time. He appears well-developed and well-nourished.  HENT:  Head: Normocephalic.  Neck: Normal range of motion.  Respiratory: Effort normal.  Musculoskeletal: Normal range of motion.  Neurological: He is alert and oriented to person, place, and time.  Psychiatric: His speech is normal and behavior is normal. Judgment and thought content normal. Cognition and  memory are normal. He exhibits a depressed mood.    Review of Systems  Psychiatric/Behavioral: Positive for depression.  All other systems reviewed and are negative.   Blood pressure 127/73, pulse 95, temperature 98.3 F (36.8 C), temperature source Oral, resp. rate (!) 22, height _0  (1.803 m), weight 74.8 kg (165 lb), SpO2 93 %.Body mass index is 23.01 kg/m.  General Appearance: Casual  Eye Contact:  Good  Speech:  Normal Rate  Volume:  Normal  Mood:  Depressed  Affect:  Congruent  Thought Process:  Coherent and Descriptions of Associations: Intact  Orientation:  Full (Time, Place, and Person)  Thought Content:  WDL and Logical  Suicidal Thoughts:  No  Homicidal Thoughts:  No  Memory:  Immediate;   Good Recent;   Good Remote;   Good  Judgement:  Fair  Insight:  Fair  Psychomotor Activity:  Normal  Concentration:  Concentration: Good and Attention Span: Good  Recall:  Good  Fund of Knowledge:  Good  Language:  Good  Akathisia:  No  Handed:  Right  AIMS (if indicated):     Assets:  Leisure Time Physical Health Resilience  ADL's:  Intact  Cognition:  WNL  Sleep:        Treatment Plan Summary: Daily contact with patient to assess and evaluate symptoms and progress in treatment, Medication management and Plan bipolar affective disorder, depressed, mild:  -Crisis stabilization -Medication management:  Continued Abilify 5 mg daily for depression (increased from 2 mg), Lexapro 20 mg daily for depression along with his medical medications -Individual counseling -[facility] to [facility]  Disposition: No evidence of imminent risk to self or others at present.    Waylan Boga, NP 12/13/2016 10:30 AM  Patient seen face-to-face for psychiatric evaluation, chart reviewed and case discussed with the physician extender and developed treatment plan. Reviewed the information documented and agree with the treatment plan. Corena Pilgrim, MD

## 2016-12-13 NOTE — ED Notes (Signed)
Dr. Nanavati at bedside 

## 2016-12-13 NOTE — Discharge Instructions (Signed)
For your mental health needs, you are advised to follow up with [facility].  New and returning patients are seen at their walk-in clinic.  Walk-in hours are Monday - Friday from 8:00 am - 3:00 pm.  Walk-in patients are seen on a first come, first served basis.  Try to arrive as early as possible for he best chance of being seen the same day: ° °     [facility] °     [address] °     [place], [place] [address] °     [phone] °

## 2016-12-13 NOTE — ED Notes (Signed)
Pt is complaining of increased SHOB.  Dr. Maryan Rued notified.  Will continue to give pt's scheduled meds.

## 2016-12-13 NOTE — BH Assessment (Signed)
[facility] Assessment Progress Note  Per Mojeed Akintayo, MD, this pt does not require psychiatric hospitalization at this time.  Pt is to be discharged from [facility] with recommendation to follow up with [facility].  This has been included in pt's discharge instructions.  Pt's nurse has been notified.  Eulah Walkup, MA Triage Specialist [phone]     

## 2017-03-21 ENCOUNTER — Emergency Department (HOSPITAL_COMMUNITY): Payer: Medicaid Other

## 2017-03-21 ENCOUNTER — Encounter (HOSPITAL_COMMUNITY): Payer: Self-pay

## 2017-03-21 ENCOUNTER — Other Ambulatory Visit: Payer: Self-pay

## 2017-03-21 ENCOUNTER — Emergency Department (HOSPITAL_COMMUNITY)
Admission: EM | Admit: 2017-03-21 | Discharge: 2017-03-22 | Disposition: A | Discharge status: Home / Self Care | Payer: Medicaid Other | Attending: Emergency Medicine | Admitting: Emergency Medicine

## 2017-03-21 DIAGNOSIS — F17210 Nicotine dependence, cigarettes, uncomplicated: Secondary | ICD-10-CM | POA: Diagnosis not present

## 2017-03-21 DIAGNOSIS — J449 Chronic obstructive pulmonary disease, unspecified: Secondary | ICD-10-CM

## 2017-03-21 DIAGNOSIS — Z85038 Personal history of other malignant neoplasm of large intestine: Secondary | ICD-10-CM | POA: Insufficient documentation

## 2017-03-21 DIAGNOSIS — Z79899 Other long term (current) drug therapy: Secondary | ICD-10-CM | POA: Diagnosis not present

## 2017-03-21 DIAGNOSIS — R0602 Shortness of breath: Secondary | ICD-10-CM | POA: Diagnosis present

## 2017-03-21 DIAGNOSIS — Z8546 Personal history of malignant neoplasm of prostate: Secondary | ICD-10-CM | POA: Diagnosis not present

## 2017-03-21 DIAGNOSIS — J441 Chronic obstructive pulmonary disease with (acute) exacerbation: Secondary | ICD-10-CM | POA: Diagnosis not present

## 2017-03-21 LAB — BASIC METABOLIC PANEL
ANION GAP: 8 (ref 5–15)
BUN: 6 mg/dL (ref 6–20)
CALCIUM: 8.4 mg/dL — AB (ref 8.9–10.3)
CO2: 32 mmol/L (ref 22–32)
Chloride: 100 mmol/L — ABNORMAL LOW (ref 101–111)
Creatinine, Ser: 0.95 mg/dL (ref 0.61–1.24)
Glucose, Bld: 123 mg/dL — ABNORMAL HIGH (ref 65–99)
Potassium: 4.1 mmol/L (ref 3.5–5.1)
Sodium: 140 mmol/L (ref 135–145)

## 2017-03-21 LAB — CBC
HCT: 39.5 % (ref 39.0–52.0)
HEMOGLOBIN: 12.6 g/dL — AB (ref 13.0–17.0)
MCH: 31.3 pg (ref 26.0–34.0)
MCHC: 31.9 g/dL (ref 30.0–36.0)
MCV: 98 fL (ref 78.0–100.0)
Platelets: 229 10*3/uL (ref 150–400)
RBC: 4.03 MIL/uL — AB (ref 4.22–5.81)
RDW: 13.7 % (ref 11.5–15.5)
WBC: 7.6 10*3/uL (ref 4.0–10.5)

## 2017-03-21 LAB — I-STAT TROPONIN, ED: TROPONIN I, POC: 0 ng/mL (ref 0.00–0.08)

## 2017-03-21 MED ORDER — MAGNESIUM SULFATE 2 GM/50ML IV SOLN
2.0000 g | Freq: Once | INTRAVENOUS | Status: AC
  Administered 2017-03-21: 2 g via INTRAVENOUS
  Filled 2017-03-21: qty 50

## 2017-03-21 MED ORDER — IPRATROPIUM-ALBUTEROL 0.5-2.5 (3) MG/3ML IN SOLN
3.0000 mL | RESPIRATORY_TRACT | Status: AC
  Administered 2017-03-21: 3 mL via RESPIRATORY_TRACT
  Filled 2017-03-21: qty 3
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
## 2017-03-21 NOTE — ED Notes (Signed)
ED Provider at bedside. 

## 2017-03-21 NOTE — ED Notes (Signed)
Pt alert and oriented x4. Skin warm and dry. Respirations equal and unlabored. NSR to cardiac moniter. Abdomen soft and non distended. VSS. No acute respiratory distress noted.

## 2017-03-21 NOTE — ED Triage Notes (Signed)
Pt has hx of COPD. Pt has been SOB x 2days. Pt uses 2L of oxygen at home. Pt received one dual neb treatment per EMS and 125 of solumedrol IV. Diminished breath sounds in lower lungs reported by EMS.Denies PCP.

## 2017-03-22 MED ORDER — ESCITALOPRAM OXALATE 20 MG PO TABS: 20.0000 mg | ORAL_TABLET | ORAL | 0 refills | Status: DC

## 2017-03-22 MED ORDER — ALBUTEROL SULFATE (2.5 MG/3ML) 0.083% IN NEBU: 5.0000 mg | INHALATION_SOLUTION | Freq: Four times a day (QID) | RESPIRATORY_TRACT | 1 refills | Status: DC | PRN

## 2017-03-22 MED ORDER — SPIRIVA RESPIMAT 2.5 MCG/ACT IN AERS: 2.0000 | INHALATION_SPRAY | Freq: Every day | RESPIRATORY_TRACT | 0 refills | Status: DC

## 2017-03-22 MED ORDER — ALBUTEROL SULFATE HFA 108 (90 BASE) MCG/ACT IN AERS
2.0000 | INHALATION_SPRAY | Freq: Once | RESPIRATORY_TRACT | Status: AC
  Administered 2017-03-22: 2 via RESPIRATORY_TRACT
  Filled 2017-03-22: qty 6.7

## 2017-03-22 MED ORDER — ARIPIPRAZOLE 5 MG PO TABS: 5.0000 mg | ORAL_TABLET | ORAL | 0 refills | Status: DC

## 2017-03-22 MED ORDER — PREDNISONE 20 MG PO TABS: ORAL_TABLET | ORAL | 0 refills | Status: DC

## 2017-03-22 MED ORDER — MOMETASONE FURO-FORMOTEROL FUM 200-5 MCG/ACT IN AERO: INHALATION_SPRAY | RESPIRATORY_TRACT | 0 refills | Status: DC

## 2017-03-22 NOTE — Discharge Instructions (Signed)
Use your inhaler every 4 hours(6 puffs) while awake, return for sudden worsening shortness of breath, or if you need to use your inhaler more often.  ° °

## 2017-03-22 NOTE — ED Notes (Signed)
Pt given Good RX coupons for elligable medications/prescriptions, case management consult placed to help pt with any other medication needs.

## 2017-03-23 ENCOUNTER — Emergency Department (HOSPITAL_COMMUNITY): Payer: Medicaid Other

## 2017-03-23 ENCOUNTER — Inpatient Hospital Stay (HOSPITAL_COMMUNITY)
Admission: EM | Admit: 2017-03-23 | Discharge: 2017-03-27 | DRG: 191 | Disposition: A | Discharge status: Home / Self Care | Payer: Medicaid Other | Attending: Family Medicine | Admitting: Family Medicine

## 2017-03-23 ENCOUNTER — Other Ambulatory Visit: Payer: Self-pay

## 2017-03-23 ENCOUNTER — Encounter (HOSPITAL_COMMUNITY): Payer: Self-pay | Admitting: Emergency Medicine

## 2017-03-23 DIAGNOSIS — J441 Chronic obstructive pulmonary disease with (acute) exacerbation: Principal | ICD-10-CM | POA: Diagnosis present

## 2017-03-23 DIAGNOSIS — T380X5A Adverse effect of glucocorticoids and synthetic analogues, initial encounter: Secondary | ICD-10-CM | POA: Diagnosis present

## 2017-03-23 DIAGNOSIS — Z8546 Personal history of malignant neoplasm of prostate: Secondary | ICD-10-CM

## 2017-03-23 DIAGNOSIS — Z590 Homelessness: Secondary | ICD-10-CM

## 2017-03-23 DIAGNOSIS — D649 Anemia, unspecified: Secondary | ICD-10-CM | POA: Diagnosis present

## 2017-03-23 DIAGNOSIS — Z85038 Personal history of other malignant neoplasm of large intestine: Secondary | ICD-10-CM

## 2017-03-23 DIAGNOSIS — J9611 Chronic respiratory failure with hypoxia: Secondary | ICD-10-CM | POA: Diagnosis present

## 2017-03-23 DIAGNOSIS — F329 Major depressive disorder, single episode, unspecified: Secondary | ICD-10-CM | POA: Diagnosis present

## 2017-03-23 DIAGNOSIS — F419 Anxiety disorder, unspecified: Secondary | ICD-10-CM | POA: Diagnosis present

## 2017-03-23 DIAGNOSIS — Z8249 Family history of ischemic heart disease and other diseases of the circulatory system: Secondary | ICD-10-CM

## 2017-03-23 DIAGNOSIS — J9622 Acute and chronic respiratory failure with hypercapnia: Secondary | ICD-10-CM | POA: Diagnosis present

## 2017-03-23 DIAGNOSIS — D72828 Other elevated white blood cell count: Secondary | ICD-10-CM | POA: Diagnosis present

## 2017-03-23 DIAGNOSIS — Z9981 Dependence on supplemental oxygen: Secondary | ICD-10-CM

## 2017-03-23 DIAGNOSIS — Z79899 Other long term (current) drug therapy: Secondary | ICD-10-CM

## 2017-03-23 DIAGNOSIS — Z933 Colostomy status: Secondary | ICD-10-CM

## 2017-03-23 DIAGNOSIS — F17210 Nicotine dependence, cigarettes, uncomplicated: Secondary | ICD-10-CM | POA: Diagnosis present

## 2017-03-23 DIAGNOSIS — F32A Depression, unspecified: Secondary | ICD-10-CM

## 2017-03-23 DIAGNOSIS — Z818 Family history of other mental and behavioral disorders: Secondary | ICD-10-CM

## 2017-03-23 DIAGNOSIS — R45851 Suicidal ideations: Secondary | ICD-10-CM | POA: Diagnosis present

## 2017-03-23 LAB — CBC WITH DIFFERENTIAL/PLATELET
BASOS ABS: 0 10*3/uL (ref 0.0–0.1)
BASOS PCT: 0 %
EOS ABS: 0.1 10*3/uL (ref 0.0–0.7)
EOS PCT: 1 %
HCT: 38 % — ABNORMAL LOW (ref 39.0–52.0)
Hemoglobin: 12.1 g/dL — ABNORMAL LOW (ref 13.0–17.0)
Lymphocytes Relative: 22 %
Lymphs Abs: 1.8 10*3/uL (ref 0.7–4.0)
MCH: 31.6 pg (ref 26.0–34.0)
MCHC: 31.8 g/dL (ref 30.0–36.0)
MCV: 99.2 fL (ref 78.0–100.0)
Monocytes Absolute: 0.9 10*3/uL (ref 0.1–1.0)
Monocytes Relative: 11 %
Neutro Abs: 5.6 10*3/uL (ref 1.7–7.7)
Neutrophils Relative %: 66 %
PLATELETS: 265 10*3/uL (ref 150–400)
RBC: 3.83 MIL/uL — AB (ref 4.22–5.81)
RDW: 13.9 % (ref 11.5–15.5)
WBC: 8.4 10*3/uL (ref 4.0–10.5)

## 2017-03-23 LAB — BASIC METABOLIC PANEL
ANION GAP: 7 (ref 5–15)
BUN: 20 mg/dL (ref 6–20)
CALCIUM: 8.6 mg/dL — AB (ref 8.9–10.3)
CO2: 33 mmol/L — ABNORMAL HIGH (ref 22–32)
Chloride: 103 mmol/L (ref 101–111)
Creatinine, Ser: 0.82 mg/dL (ref 0.61–1.24)
Glucose, Bld: 103 mg/dL — ABNORMAL HIGH (ref 65–99)
POTASSIUM: 4.1 mmol/L (ref 3.5–5.1)
SODIUM: 143 mmol/L (ref 135–145)

## 2017-03-23 LAB — INFLUENZA PANEL BY PCR (TYPE A & B)
INFLAPCR: NEGATIVE
Influenza B By PCR: NEGATIVE

## 2017-03-23 MED ORDER — SODIUM CHLORIDE 0.9 % IV BOLUS (SEPSIS)
500.0000 mL | Freq: Once | INTRAVENOUS | Status: AC
  Administered 2017-03-23: 500 mL via INTRAVENOUS

## 2017-03-23 MED ORDER — METHYLPREDNISOLONE SODIUM SUCC 125 MG IJ SOLR
125.0000 mg | Freq: Once | INTRAMUSCULAR | Status: AC
  Administered 2017-03-23: 125 mg via INTRAVENOUS
  Filled 2017-03-23: qty 2

## 2017-03-23 MED ORDER — IPRATROPIUM BROMIDE 0.02 % IN SOLN
0.5000 mg | Freq: Once | RESPIRATORY_TRACT | Status: AC
  Administered 2017-03-23: 0.5 mg via RESPIRATORY_TRACT
  Filled 2017-03-23: qty 2.5

## 2017-03-23 MED ORDER — ALBUTEROL SULFATE (2.5 MG/3ML) 0.083% IN NEBU
5.0000 mg | INHALATION_SOLUTION | Freq: Once | RESPIRATORY_TRACT | Status: AC
Start: 2017-03-23 — End: 2017-03-23
  Administered 2017-03-23: 5 mg via RESPIRATORY_TRACT
  Filled 2017-03-23: qty 6

## 2017-03-23 NOTE — ED Triage Notes (Signed)
Pt brought in by EMS from home with c/o shortness of breath, onset 3 days ago--- was seen emergently at [facility] and subsequently discharged home for COPD exacerbation.  Pt reports worsening shortness of breath that he is unable to "even walked to the bathroom without being short of breath".  Pt on continuous O2 at 2 L/min via [place].

## 2017-03-23 NOTE — ED Provider Notes (Signed)
[facility] DEPT Provider Note   CSN: [id number] Arrival date & time: 03/23/17  2108     History   Chief Complaint Chief Complaint  Patient presents with  . Shortness of Breath    HPI Taylor [name] is a 62 y.o. male with a hx of COPD (hospitalized last approx 3 weeks ago), oxygen at home at 2lpm, hx of colon cancer and prostate cancer, former smoker presents to the Emergency Department complaining of gradual, persistent, progressively worsening SOB onset 3-4. Associated symptoms include subjective fevers, chills, sore throat, rhinorrhea, cough, congestion, nausea.  Pt reports he was seen at [facility] 3 days ago for the same.  Pt reports he was discharged with prednisone 3 days ago, but was unable to get this filled. He also reports he is out of his albuterol nebulizers. He reports he is homeless and living in a motel at this time.  Pt reports using his albuterol MDI without relief.  Pt denies chest pain, abd pain, vomiting, diarrhea.  Pt arrives via EMS and was given zofran and placed back on oxygen, but no albuterol or solumedrol was given.     The history is provided by the patient and medical records. No language interpreter was used.    Past Medical History:  Diagnosis Date  . Bipolar 1 disorder ([place]) 02/05/2012  . Cancer ([place]) 04/11/11   adenocarcinoma of colon, 7/19 nodes pos.FINISHED CHEMO/DR. SHERRILL  . COPD (chronic obstructive pulmonary disease) ([place])    SMOKER  . Depression    Bipolar disorder  . Full dentures   . Hemorrhoids   . Inguinal hernia    RIGHT- PAINFUL  . Prostate cancer ([place])    Gleason score = 7, supposed to have radiation therapy but he has not followed up  . Rib fractures    hx of  . Shortness of breath     Patient Active Problem List   Diagnosis Date Noted  . 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 COPD with acute exacerbation ([place]) 10/22/2016  . Acute bronchitis due to human  metapneumovirus 01/16/2013  . Alcohol abuse 01/09/2013  . COPD exacerbation ([place]) 01/09/2013  . Chest pain 01/09/2013  . Acute respiratory failure with hypoxia ([place]) 01/09/2013  . Smoker 12/16/2012  . Inguinal hernia unilateral, non-recurrent, right 05/01/2012  . Dyspepsia 02/05/2012  . Hyperglycemia 02/04/2012  . COPD  GOLD III 02/03/2012  . Thrombocytopenia ([place]) 02/03/2012  . 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 COLON SURGERY  04/11/11   Sigmoid colectomy  . COLOSTOMY REVISION  04/11/2011   Procedure: COLON RESECTION SIGMOID;  Surgeon: [name], MD;  Location: [facility];  Service: General;  Laterality: N/A;  low anterior colon resection   . INGUINAL HERNIA REPAIR Right 05/01/2012   Procedure: HERNIA REPAIR INGUINAL ADULT;  Surgeon: [name], MD;  Location: [facility];  Service: General;  Laterality: Right;  . INSERTION OF MESH Right 05/01/2012   Procedure: INSERTION OF MESH;  Surgeon: [name], MD;  Location: [facility];  Service: General;  Laterality: Right;  . PORT-A-CATH REMOVAL Left 05/01/2012   Procedure: REMOVAL Infusion Port;  Surgeon: [name], MD;  Location: [facility];  Service: General;  Laterality: Left;  . PORTACATH PLACEMENT    . PORTACATH PLACEMENT  05/02/2011   Procedure: INSERTION PORT-A-CATH;  Surgeon: [name], MD;  Location: [facility];  Service: General;  Laterality: N/A;  Home Medications    Prior to Admission medications   Medication Sig Start Date End Date Taking? Authorizing Provider  acetaminophen (TYLENOL) 500 MG tablet Take 1,000 mg by mouth every 6 (six) hours as needed for headache.    [provider]  albuterol (PROVENTIL HFA;VENTOLIN HFA) 108 (90 Base) MCG/ACT inhaler Inhale 1-2 puffs into the lungs every 6 (six) hours as needed for wheezing or shortness of breath. 12/11/16   Palumbo, April, MD  albuterol (PROVENTIL) (2.5 MG/3ML) 0.083% nebulizer solution Take 6 mLs (5 mg  total) by nebulization every 6 (six) hours as needed for wheezing or shortness of breath. 03/22/17   Larene Pickett, PA-C  ARIPiprazole (ABILIFY) 5 MG tablet Take 1 tablet (5 mg total) by mouth every morning. 03/22/17   Deno Etienne, DO  Aspirin-Salicylamide-Caffeine (BC HEADACHE POWDER PO) Take 1 packet daily as needed by mouth (HEADACHE).    [provider]  doxycycline (VIBRAMYCIN) 100 MG capsule Take 1 capsule (100 mg total) by mouth 2 (two) times daily. One po bid x 7 days 12/11/16   Palumbo, April, MD  escitalopram (LEXAPRO) 20 MG tablet Take 1 tablet (20 mg total) by mouth every morning. 03/22/17   Deno Etienne, DO  lovastatin (MEVACOR) 20 MG tablet Take 1 tablet (20 mg total) by mouth at bedtime. 10/24/16   Eugenie Filler, MD  mometasone-formoterol (DULERA) 200-5 MCG/ACT AERO INHALE 2 PUFFS INTO THE LUNGS FIRST THING IN THE MORNING THEN ANOTHER 2 PUFFS 12 HOURS LATER. 03/22/17   Deno Etienne, DO  predniSONE (DELTASONE) 20 MG tablet 2 tabs po daily x 4 days 03/22/17   Deno Etienne, DO  SPIRIVA RESPIMAT 2.5 MCG/ACT AERS Inhale 2 puffs into the lungs daily. 03/22/17   Deno Etienne, DO    Family History Family History  Problem Relation Age of Onset  . Heart disease Father   . Lung cancer Maternal Uncle        smoked    Social History Social History   Tobacco Use  . Smoking status: Current Every Day Smoker    Packs/day: 0.50    Years: 45.00    Pack years: 22.50    Types: Cigarettes    Last attempt to quit: 02/11/2014    Years since quitting: 3.1  . Smokeless tobacco: Former Network engineer Use Topics  . Alcohol use: No    Comment: h/o use in the past, no h/o heavy use  . Drug use: No     Allergies   Codeine   Review of Systems Review of Systems  Constitutional: Positive for fever (subjective). Negative for appetite change, diaphoresis, fatigue and unexpected weight change.  HENT: Positive for congestion, postnasal drip, rhinorrhea, sinus pressure and sore throat. Negative for  mouth sores.   Eyes: Negative for visual disturbance.  Respiratory: Positive for cough, chest tightness, shortness of breath and wheezing.   Cardiovascular: Negative for chest pain.  Gastrointestinal: Positive for nausea. Negative for abdominal pain, constipation, diarrhea and vomiting.  Endocrine: Negative for polydipsia, polyphagia and polyuria.  Genitourinary: Negative for dysuria, frequency, hematuria and urgency.  Musculoskeletal: Negative for back pain and neck stiffness.  Skin: Negative for rash.  Allergic/Immunologic: Negative for immunocompromised state.  Neurological: Negative for syncope, light-headedness and headaches.  Hematological: Does not bruise/bleed easily.  Psychiatric/Behavioral: Negative for sleep disturbance. The patient is not nervous/anxious.      Physical Exam Updated Vital Signs BP 120/80   Pulse 95   Temp 98.2 F (36.8 C) (Oral)   Resp  20   Ht 5\' 11"  (1.803 m)   Wt 74.8 kg (165 lb)   SpO2 100%   BMI 23.01 kg/m   Physical Exam  Constitutional: He appears well-developed and well-nourished. No distress.  Awake, alert, nontoxic appearance  HENT:  Head: Normocephalic and atraumatic.  Right Ear: Tympanic membrane, external ear and ear canal normal.  Left Ear: Tympanic membrane, external ear and ear canal normal.  Nose: Mucosal edema and rhinorrhea present. No epistaxis. Right sinus exhibits no maxillary sinus tenderness and no frontal sinus tenderness. Left sinus exhibits no maxillary sinus tenderness and no frontal sinus tenderness.  Mouth/Throat: Uvula is midline, oropharynx is clear and moist and mucous membranes are normal. Mucous membranes are not pale and not cyanotic. No oropharyngeal exudate, posterior oropharyngeal edema, posterior oropharyngeal erythema or tonsillar abscesses.  Eyes: Conjunctivae are normal. Pupils are equal, round, and reactive to light. No scleral icterus.  Neck: Normal range of motion and full passive range of motion without  pain. Neck supple.  Cardiovascular: Normal rate, regular rhythm and intact distal pulses.  Pulmonary/Chest: Effort normal. No stridor. No respiratory distress. He has decreased breath sounds ( throughout). He has no wheezes.  Currently on oxygen 2lpm via [place]  Abdominal: Soft. Bowel sounds are normal. He exhibits no mass. There is no tenderness. There is no rebound and no guarding.  Musculoskeletal: Normal range of motion. He exhibits no edema.  Lymphadenopathy:    He has no cervical adenopathy.  Neurological: He is alert.  Speech is clear and goal oriented Moves extremities without ataxia  Skin: Skin is warm and dry. No rash noted. He is not diaphoretic.  Psychiatric: He has a normal mood and affect.  Nursing note and vitals reviewed.    ED Treatments / Results  Labs (all labs ordered are listed, but only abnormal results are displayed) Labs Reviewed  CBC WITH DIFFERENTIAL/PLATELET - Abnormal; Notable for the following components:      Result Value   RBC 3.83 (*)    Hemoglobin 12.1 (*)    HCT 38.0 (*)    All other components within normal limits  BASIC METABOLIC PANEL - Abnormal; Notable for the following components:   CO2 33 (*)    Glucose, Bld 103 (*)    Calcium 8.6 (*)    All other components within normal limits  INFLUENZA PANEL BY PCR (TYPE A & B)    EKG  EKG Interpretation  Date/Time:  Sunday March 23 2017 21:43:26 EST Ventricular Rate:  96 PR Interval:    QRS Duration: 78 QT Interval:  341 QTC Calculation: 431 R Axis:   43 Text Interpretation:  Sinus rhythm Borderline short PR interval Anteroseptal infarct, age indeterminate No significant change since last tracing Confirmed by Duffy Bruce [id number]) on 03/23/2017 10:51:25 PM       Radiology Dg Chest 2 View  Result Date: 03/23/2017 CLINICAL DATA:  Shortness of breath EXAM: CHEST  2 VIEW COMPARISON:  03/21/2017 FINDINGS: Lungs are hyperinflated. The heart size and mediastinal contours are within normal limits.  Both lungs are clear. The visualized skeletal structures are unremarkable. IMPRESSION: Unchanged hyperinflation suggesting COPD. No active cardiopulmonary disease. Electronically Signed   By: Ulyses Jarred M.D.   On: 03/23/2017 22:21    Procedures Procedures (including critical care time)  Medications Ordered in ED Medications  albuterol (PROVENTIL) (2.5 MG/3ML) 0.083% nebulizer solution 5 mg (5 mg Nebulization Given 03/23/17 2239)  ipratropium (ATROVENT) nebulizer solution 0.5 mg (0.5 mg Nebulization Given 03/23/17 2238)  methylPREDNISolone  sodium succinate (SOLU-MEDROL) 125 mg/2 mL injection 125 mg (125 mg Intravenous Given 03/23/17 2238)  sodium chloride 0.9 % bolus 500 mL (500 mLs Intravenous New Bag/Given 03/23/17 2236)     Initial Impression / Assessment and Plan / ED Course  I have reviewed the triage vital signs and the nursing notes.  Pertinent labs & imaging results that were available during my care of the patient were reviewed by me and considered in my medical decision making (see chart for details).  Clinical Course as of Mar 24 12  Sun Mar 23, 2017  2343 Mild wheezing after nebulizer.    [HM]  Mon Mar 24, 2017  0009 Discussed with Dr. Tamala Julian who will admit.    [HM]    Clinical Course User Index [HM] Aalivia Mcgraw, Gwenlyn Perking    Presents with COPD exacerbation.  He reports he is using oxygen at home at 2 L via nasal cannula.  No hypoxia here, though EMS did report some mild hypoxia upon their arrival.  He does have shortness of breath and chest tightness along with cough.  Other symptoms are concerning for possible influenza, approximately 6 days and influenza test is negative today.  Patient was seen several days ago and did not require admission at this time however, he has been unable to fill any of his prescriptions.  I am concerned about discharge home and continued bounce backs.  Patient will need admission for further albuterol nebulizers and steroids.  The patient was  discussed with and seen by Dr. Ellender Hose who agrees with the treatment plan.   Final Clinical Impressions(s) / ED Diagnoses   Final diagnoses:  COPD exacerbation [facility])    ED Discharge Orders    None       Damarcus Reggio, Gwenlyn Perking 03/24/17 0014    Duffy Bruce, MD 03/24/17 [phone]

## 2017-03-23 NOTE — ED Notes (Signed)
Bed: FX58 Expected date:  Expected time:  Means of arrival:  Comments: 62 yo M/Shortness of breath

## 2017-03-24 ENCOUNTER — Other Ambulatory Visit: Payer: Self-pay

## 2017-03-24 DIAGNOSIS — F17210 Nicotine dependence, cigarettes, uncomplicated: Secondary | ICD-10-CM | POA: Diagnosis present

## 2017-03-24 DIAGNOSIS — Z79899 Other long term (current) drug therapy: Secondary | ICD-10-CM | POA: Diagnosis not present

## 2017-03-24 DIAGNOSIS — T380X5A Adverse effect of glucocorticoids and synthetic analogues, initial encounter: Secondary | ICD-10-CM | POA: Diagnosis present

## 2017-03-24 DIAGNOSIS — Z590 Homelessness: Secondary | ICD-10-CM | POA: Diagnosis not present

## 2017-03-24 DIAGNOSIS — R0602 Shortness of breath: Secondary | ICD-10-CM | POA: Diagnosis present

## 2017-03-24 DIAGNOSIS — Z818 Family history of other mental and behavioral disorders: Secondary | ICD-10-CM | POA: Diagnosis not present

## 2017-03-24 DIAGNOSIS — Z8249 Family history of ischemic heart disease and other diseases of the circulatory system: Secondary | ICD-10-CM | POA: Diagnosis not present

## 2017-03-24 DIAGNOSIS — Z933 Colostomy status: Secondary | ICD-10-CM | POA: Diagnosis not present

## 2017-03-24 DIAGNOSIS — Z8546 Personal history of malignant neoplasm of prostate: Secondary | ICD-10-CM | POA: Diagnosis not present

## 2017-03-24 DIAGNOSIS — D649 Anemia, unspecified: Secondary | ICD-10-CM | POA: Diagnosis present

## 2017-03-24 DIAGNOSIS — Z85038 Personal history of other malignant neoplasm of large intestine: Secondary | ICD-10-CM | POA: Diagnosis not present

## 2017-03-24 DIAGNOSIS — J9622 Acute and chronic respiratory failure with hypercapnia: Secondary | ICD-10-CM | POA: Diagnosis present

## 2017-03-24 DIAGNOSIS — J9611 Chronic respiratory failure with hypoxia: Secondary | ICD-10-CM

## 2017-03-24 DIAGNOSIS — Z9981 Dependence on supplemental oxygen: Secondary | ICD-10-CM | POA: Diagnosis not present

## 2017-03-24 DIAGNOSIS — F319 Bipolar disorder, unspecified: Secondary | ICD-10-CM | POA: Diagnosis not present

## 2017-03-24 DIAGNOSIS — F329 Major depressive disorder, single episode, unspecified: Secondary | ICD-10-CM | POA: Diagnosis present

## 2017-03-24 DIAGNOSIS — J441 Chronic obstructive pulmonary disease with (acute) exacerbation: Principal | ICD-10-CM

## 2017-03-24 DIAGNOSIS — R062 Wheezing: Secondary | ICD-10-CM | POA: Diagnosis not present

## 2017-03-24 DIAGNOSIS — R45851 Suicidal ideations: Secondary | ICD-10-CM | POA: Diagnosis present

## 2017-03-24 DIAGNOSIS — D72828 Other elevated white blood cell count: Secondary | ICD-10-CM | POA: Diagnosis present

## 2017-03-24 DIAGNOSIS — F419 Anxiety disorder, unspecified: Secondary | ICD-10-CM | POA: Diagnosis present

## 2017-03-24 LAB — RESPIRATORY PANEL BY PCR
ADENOVIRUS-RVPPCR: NOT DETECTED
Bordetella pertussis: NOT DETECTED
CORONAVIRUS HKU1-RVPPCR: NOT DETECTED
CORONAVIRUS NL63-RVPPCR: NOT DETECTED
CORONAVIRUS OC43-RVPPCR: NOT DETECTED
Chlamydophila pneumoniae: NOT DETECTED
Coronavirus 229E: NOT DETECTED
INFLUENZA A-RVPPCR: NOT DETECTED
Influenza B: NOT DETECTED
METAPNEUMOVIRUS-RVPPCR: NOT DETECTED
Mycoplasma pneumoniae: NOT DETECTED
PARAINFLUENZA VIRUS 1-RVPPCR: NOT DETECTED
PARAINFLUENZA VIRUS 2-RVPPCR: NOT DETECTED
PARAINFLUENZA VIRUS 3-RVPPCR: NOT DETECTED
PARAINFLUENZA VIRUS 4-RVPPCR: NOT DETECTED
RHINOVIRUS / ENTEROVIRUS - RVPPCR: NOT DETECTED
Respiratory Syncytial Virus: NOT DETECTED

## 2017-03-24 LAB — CBC
HCT: 37 % — ABNORMAL LOW (ref 39.0–52.0)
HEMOGLOBIN: 11.9 g/dL — AB (ref 13.0–17.0)
MCH: 31.6 pg (ref 26.0–34.0)
MCHC: 32.2 g/dL (ref 30.0–36.0)
MCV: 98.4 fL (ref 78.0–100.0)
PLATELETS: 240 10*3/uL (ref 150–400)
RBC: 3.76 MIL/uL — AB (ref 4.22–5.81)
RDW: 13.8 % (ref 11.5–15.5)
WBC: 6.6 10*3/uL (ref 4.0–10.5)

## 2017-03-24 LAB — RAPID STREP SCREEN (MED CTR MEBANE ONLY): Streptococcus, Group A Screen (Direct): NEGATIVE

## 2017-03-24 MED ORDER — ONDANSETRON HCL 4 MG PO TABS: 4.0000 mg | ORAL_TABLET | Freq: Four times a day (QID) | ORAL | Status: DC | PRN

## 2017-03-24 MED ORDER — GUAIFENESIN ER 600 MG PO TB12
600.0000 mg | ORAL_TABLET | Freq: Two times a day (BID) | ORAL | Status: DC
  Administered 2017-03-24 – 2017-03-27 (×8): 600 mg via ORAL
  Filled 2017-03-24 (×8): qty 1

## 2017-03-24 MED ORDER — METOCLOPRAMIDE HCL 5 MG/ML IJ SOLN
5.0000 mg | Freq: Once | INTRAMUSCULAR | Status: AC
  Administered 2017-03-24: 5 mg via INTRAVENOUS
  Filled 2017-03-24: qty 2

## 2017-03-24 MED ORDER — ORAL CARE MOUTH RINSE
15.0000 mL | Freq: Two times a day (BID) | OROMUCOSAL | Status: DC
  Administered 2017-03-24 – 2017-03-27 (×7): 15 mL via OROMUCOSAL

## 2017-03-24 MED ORDER — GI COCKTAIL ~~LOC~~: 30.0000 mL | Freq: Two times a day (BID) | ORAL | Status: DC | PRN

## 2017-03-24 MED ORDER — KETOROLAC TROMETHAMINE 15 MG/ML IJ SOLN
15.0000 mg | Freq: Once | INTRAMUSCULAR | Status: AC
  Administered 2017-03-24: 15 mg via INTRAVENOUS
  Filled 2017-03-24: qty 1

## 2017-03-24 MED ORDER — ENOXAPARIN SODIUM 40 MG/0.4ML ~~LOC~~ SOLN
40.0000 mg | Freq: Every day | SUBCUTANEOUS | Status: DC
  Administered 2017-03-24 – 2017-03-26 (×4): 40 mg via SUBCUTANEOUS
  Filled 2017-03-24 (×4): qty 0.4

## 2017-03-24 MED ORDER — DIPHENHYDRAMINE HCL 50 MG/ML IJ SOLN
12.5000 mg | Freq: Once | INTRAMUSCULAR | Status: AC
  Administered 2017-03-24: 12.5 mg via INTRAVENOUS
  Filled 2017-03-24: qty 1

## 2017-03-24 MED ORDER — GI COCKTAIL ~~LOC~~
30.0000 mL | ORAL | Status: AC
  Administered 2017-03-24: 30 mL via ORAL
  Filled 2017-03-24: qty 30

## 2017-03-24 MED ORDER — ESCITALOPRAM OXALATE 10 MG PO TABS
20.0000 mg | ORAL_TABLET | Freq: Every day | ORAL | Status: DC
  Administered 2017-03-25 – 2017-03-27 (×3): 20 mg via ORAL
  Filled 2017-03-24 (×3): qty 2

## 2017-03-24 MED ORDER — IPRATROPIUM-ALBUTEROL 0.5-2.5 (3) MG/3ML IN SOLN
3.0000 mL | Freq: Three times a day (TID) | RESPIRATORY_TRACT | Status: DC
  Administered 2017-03-24 – 2017-03-27 (×9): 3 mL via RESPIRATORY_TRACT
  Filled 2017-03-24 (×8): qty 3

## 2017-03-24 MED ORDER — IPRATROPIUM-ALBUTEROL 0.5-2.5 (3) MG/3ML IN SOLN
3.0000 mL | Freq: Four times a day (QID) | RESPIRATORY_TRACT | Status: DC
  Administered 2017-03-24: 3 mL via RESPIRATORY_TRACT
  Filled 2017-03-24: qty 3

## 2017-03-24 MED ORDER — METHYLPREDNISOLONE SODIUM SUCC 125 MG IJ SOLR
60.0000 mg | Freq: Three times a day (TID) | INTRAMUSCULAR | Status: DC
Start: 2017-03-24 — End: 2017-03-25
  Administered 2017-03-24 – 2017-03-25 (×4): 60 mg via INTRAVENOUS
  Filled 2017-03-24 (×4): qty 2

## 2017-03-24 MED ORDER — BUDESONIDE 0.5 MG/2ML IN SUSP
0.5000 mg | Freq: Two times a day (BID) | RESPIRATORY_TRACT | Status: DC
  Administered 2017-03-24 – 2017-03-27 (×8): 0.5 mg via RESPIRATORY_TRACT
  Filled 2017-03-24 (×8): qty 2

## 2017-03-24 MED ORDER — ACETAMINOPHEN 325 MG PO TABS
650.0000 mg | ORAL_TABLET | ORAL | Status: AC
  Administered 2017-03-24: 650 mg via ORAL
  Filled 2017-03-24: qty 2

## 2017-03-24 MED ORDER — ACETAMINOPHEN 650 MG RE SUPP: 650.0000 mg | Freq: Four times a day (QID) | RECTAL | Status: DC | PRN

## 2017-03-24 MED ORDER — IPRATROPIUM-ALBUTEROL 0.5-2.5 (3) MG/3ML IN SOLN: 3.0000 mL | RESPIRATORY_TRACT | Status: DC | PRN

## 2017-03-24 MED ORDER — ACETAMINOPHEN 325 MG PO TABS
650.0000 mg | ORAL_TABLET | Freq: Four times a day (QID) | ORAL | Status: DC | PRN
  Administered 2017-03-24 – 2017-03-25 (×2): 650 mg via ORAL
  Filled 2017-03-24 (×2): qty 2

## 2017-03-24 MED ORDER — AZITHROMYCIN 500 MG IV SOLR
500.0000 mg | Freq: Every day | INTRAVENOUS | Status: DC
  Administered 2017-03-24 – 2017-03-25 (×3): 500 mg via INTRAVENOUS
  Filled 2017-03-24 (×3): qty 500

## 2017-03-24 MED ORDER — ARFORMOTEROL TARTRATE 15 MCG/2ML IN NEBU
15.0000 ug | INHALATION_SOLUTION | Freq: Two times a day (BID) | RESPIRATORY_TRACT | Status: DC
  Administered 2017-03-24 – 2017-03-27 (×8): 15 ug via RESPIRATORY_TRACT
  Filled 2017-03-24 (×9): qty 2

## 2017-03-24 MED ORDER — ONDANSETRON HCL 4 MG/2ML IJ SOLN
4.0000 mg | Freq: Four times a day (QID) | INTRAMUSCULAR | Status: DC | PRN
Start: 2017-03-24 — End: 2017-03-27
  Administered 2017-03-24: 4 mg via INTRAVENOUS
  Filled 2017-03-24: qty 2

## 2017-03-24 NOTE — H&P (Signed)
History and Physical    Taylor [name] UYQ:[id number] DOB: 11-Feb-1955 DOA: 03/23/2017  Referring MD/NP/PA: Gwenlyn Perking PCP: Patient, No Pcp Per  Patient coming from: Via EMS  Chief Complaint: Shortness of breath  I have personally briefly reviewed patient's old medical records in [place]   HPI: Taylor [name] is a 62 y.o. male with medical history significant of COPD, on 2 L of oxygen 24/7, bipolar disorder, tobacco abuse, history of prostate cancer, and colon cancer; who presents with complaints of progressively worsening shortness of breath over the last week.  He reports having a productive cough with thick sputum and shortness of breath to the point which she is unable to walk a few feet without becoming significantly fatigued.  Associated symptoms include hot flashes, sore throat, nasal congestion, headache, nausea, and reports of diarrhea. Denies any chest pain, vomiting, blood in stool, or loss of consciousness.  Patient was just seen in the emergency department 2 days ago, but reported being unable to obtain his medications due to lack of funds.  Reports currently living in a hotel and being unable to get into boardinghouse because of being on oxygen and prior conviction.  ED Course: Upon admission into the emergency department patient was noted to be afebrile, respirations 19-22, O2 saturations maintained on 2 L of nasal cannula oxygen.  Labs revealed WBC 8.4, hemoglobin 12.1, BUN 20, creatinine 0.82, troponin 0, and influenza screen negative.  Patient was given 125 mg of Solu-Medrol and DuoNeb breathing treatment.  [facility] called to admit.  Review of Systems  Constitutional: Positive for malaise/fatigue.       Positive for hot flashes  HENT: Positive for congestion and sore throat.   Eyes: Negative for photophobia and pain.  Respiratory: Positive for cough and shortness of breath.   Cardiovascular: Negative for chest pain and leg swelling.       Reports chest  tightness  Gastrointestinal: Positive for diarrhea, heartburn and nausea. Negative for vomiting.  Genitourinary: Negative for dysuria and frequency.  Musculoskeletal: Negative for back pain and falls.  Skin: Negative for itching and rash.  Neurological: Negative for focal weakness and loss of consciousness.  Psychiatric/Behavioral: Negative for suicidal ideas. The patient does not have insomnia.     Past Medical History:  Diagnosis Date  . Bipolar 1 disorder ([place]) 02/05/2012  . Cancer ([place]) 04/11/11   adenocarcinoma of colon, 7/19 nodes pos.FINISHED CHEMO/DR. SHERRILL  . COPD (chronic obstructive pulmonary disease) ([place])    SMOKER  . Depression    Bipolar disorder  . Full dentures   . Hemorrhoids   . Inguinal hernia    RIGHT- PAINFUL  . Prostate cancer ([place])    Gleason score = 7, supposed to have radiation therapy but he has not followed up  . Rib fractures    hx of  . Shortness of breath     Past Surgical History:  Procedure Laterality Date  . COLON SURGERY  04/11/11   Sigmoid colectomy  . COLOSTOMY REVISION  04/11/2011   Procedure: COLON RESECTION SIGMOID;  Surgeon: [name], MD;  [facility]: [facility];  Service: General;  Laterality: N/A;  low anterior colon resection   . INGUINAL HERNIA REPAIR Right 05/01/2012   Procedure: HERNIA REPAIR INGUINAL ADULT;  Surgeon: [name], MD;  [facility];  Service: General;  Laterality: Right;  . INSERTION OF MESH Right 05/01/2012   Procedure: INSERTION OF MESH;  Surgeon: [name], MD;  [facility];  Service:  General;  Laterality: Right;  . PORT-A-CATH REMOVAL Left 05/01/2012   Procedure: REMOVAL Infusion Port;  Surgeon: [name], MD;  [facility];  Service: General;  Laterality: Left;  . PORTACATH PLACEMENT    . PORTACATH PLACEMENT  05/02/2011   Procedure: INSERTION PORT-A-CATH;  Surgeon: [name], MD;  [facility];  Service: General;  Laterality: N/A;     reports that he has been smoking cigarettes.   He has a 22.50 pack-year smoking history. He has quit using smokeless tobacco. He reports that he does not drink alcohol or use drugs.  Allergies  Allergen Reactions  . Codeine Nausea And Vomiting    Family History  Problem Relation Age of Onset  . Heart disease Father   . Lung cancer Maternal Uncle        smoked    Prior to Admission medications   Medication Sig Start Date End Date Taking? Authorizing Provider  acetaminophen (TYLENOL) 500 MG tablet Take 1,000 mg by mouth every 6 (six) hours as needed for headache.    [provider]  albuterol (PROVENTIL HFA;VENTOLIN HFA) 108 (90 Base) MCG/ACT inhaler Inhale 1-2 puffs into the lungs every 6 (six) hours as needed for wheezing or shortness of breath. 12/11/16   Palumbo, April, MD  albuterol (PROVENTIL) (2.5 MG/3ML) 0.083% nebulizer solution Take 6 mLs (5 mg total) by nebulization every 6 (six) hours as needed for wheezing or shortness of breath. 03/22/17   Larene Pickett, PA-C  ARIPiprazole (ABILIFY) 5 MG tablet Take 1 tablet (5 mg total) by mouth every morning. 03/22/17   Deno Etienne, DO  Aspirin-Salicylamide-Caffeine (BC HEADACHE POWDER PO) Take 1 packet daily as needed by mouth (HEADACHE).    [provider]  doxycycline (VIBRAMYCIN) 100 MG capsule Take 1 capsule (100 mg total) by mouth 2 (two) times daily. One po bid x 7 days 12/11/16   Palumbo, April, MD  escitalopram (LEXAPRO) 20 MG tablet Take 1 tablet (20 mg total) by mouth every morning. 03/22/17   Deno Etienne, DO  lovastatin (MEVACOR) 20 MG tablet Take 1 tablet (20 mg total) by mouth at bedtime. 10/24/16   Eugenie Filler, MD  mometasone-formoterol (DULERA) 200-5 MCG/ACT AERO INHALE 2 PUFFS INTO THE LUNGS FIRST THING IN THE MORNING THEN ANOTHER 2 PUFFS 12 HOURS LATER. 03/22/17   Deno Etienne, DO  predniSONE (DELTASONE) 20 MG tablet 2 tabs po daily x 4 days 03/22/17   Deno Etienne, DO  SPIRIVA RESPIMAT 2.5 MCG/ACT AERS Inhale 2 puffs into the lungs daily. 03/22/17   Deno Etienne,  DO    Physical Exam:  Constitutional: NAD, calm, comfortable Vitals:   03/23/17 2129 03/23/17 2130 03/23/17 2300 03/23/17 2330  BP: 120/80 123/76 119/76 126/69  Pulse: 95 95 92 76  Resp: 20  (!) 22 19  Temp: 98.2 F (36.8 C)     TempSrc: Oral     SpO2: 100% 95% 93% 95%  Weight:  74.8 kg (165 lb)    Height:  5\' 11"  (1.803 m)     Eyes: PERRL, lids and conjunctivae normal ENMT: Mucous membranes are moist. Posterior pharynx clear of any exudate or lesions.   Neck: normal, supple, no masses, no thyromegaly Respiratory: Normal respiratory effort with expiratory wheezes present.  Patient on nasal cannula oxygen of 2 liters. Cardiovascular: Regular rate and rhythm, no murmurs / rubs / gallops. No extremity edema. 2+ pedal pulses. No carotid bruits.  Abdomen: no tenderness, no masses palpated. No hepatosplenomegaly. Bowel sounds  positive.  Musculoskeletal: no clubbing / cyanosis. No joint deformity upper and lower extremities. Good ROM, no contractures. Normal muscle tone.  Skin:  lesions, ulcers. No induration Neurologic: CN 2-12 grossly intact. Sensation intact, DTR normal. Strength 5/5 in all 4.  Psychiatric: Normal judgment and insight. Alert and oriented x 3. Normal mood.     Labs on Admission: I have personally reviewed following labs and imaging studies  CBC: Recent Labs  Lab 03/21/17 2140 03/23/17 2229  WBC 7.6 8.4  NEUTROABS  --  5.6  HGB 12.6* 12.1*  HCT 39.5 38.0*  MCV 98.0 99.2  PLT 229 096   Basic Metabolic Panel: Recent Labs  Lab 03/21/17 2140 03/23/17 2229  NA 140 143  K 4.1 4.1  CL 100* 103  CO2 32 33*  GLUCOSE 123* 103*  BUN 6 20  CREATININE 0.95 0.82  CALCIUM 8.4* 8.6*   GFR: Estimated Creatinine Clearance: 98.8 mL/min (by C-G formula based on SCr of 0.82 mg/dL). Liver Function Tests: No results for input(s): AST, ALT, ALKPHOS, BILITOT, PROT, ALBUMIN in the last 168 hours. No results for input(s): LIPASE, AMYLASE in the last 168 hours. No  results for input(s): AMMONIA in the last 168 hours. Coagulation Profile: No results for input(s): INR, PROTIME in the last 168 hours. Cardiac Enzymes: No results for input(s): CKTOTAL, CKMB, CKMBINDEX, TROPONINI in the last 168 hours. BNP (last 3 results) No results for input(s): PROBNP in the last 8760 hours. HbA1C: No results for input(s): HGBA1C in the last 72 hours. CBG: No results for input(s): GLUCAP in the last 168 hours. Lipid Profile: No results for input(s): CHOL, HDL, LDLCALC, TRIG, CHOLHDL, LDLDIRECT in the last 72 hours. Thyroid Function Tests: No results for input(s): TSH, T4TOTAL, FREET4, T3FREE, THYROIDAB in the last 72 hours. Anemia Panel: No results for input(s): VITAMINB12, FOLATE, FERRITIN, TIBC, IRON, RETICCTPCT in the last 72 hours. Urine analysis:    Component Value Date/Time   COLORURINE YELLOW 10/18/2016 1855   APPEARANCEUR HAZY (A) 10/18/2016 1855   LABSPEC 1.028 10/18/2016 1855   PHURINE 5.0 10/18/2016 1855   GLUCOSEU NEGATIVE 10/18/2016 1855   HGBUR NEGATIVE 10/18/2016 1855   BILIRUBINUR NEGATIVE 10/18/2016 1855   KETONESUR NEGATIVE 10/18/2016 1855   PROTEINUR 100 (A) 10/18/2016 1855   UROBILINOGEN 1.0 01/09/2013 1843   NITRITE NEGATIVE 10/18/2016 1855   LEUKOCYTESUR NEGATIVE 10/18/2016 1855   Sepsis Labs: No results found for this or any previous visit (from the past 240 hour(s)).   Radiological Exams on Admission: Dg Chest 2 View  Result Date: 03/23/2017 CLINICAL DATA:  Shortness of breath EXAM: CHEST  2 VIEW COMPARISON:  03/21/2017 FINDINGS: Lungs are hyperinflated. The heart size and mediastinal contours are within normal limits. Both lungs are clear. The visualized skeletal structures are unremarkable. IMPRESSION: Unchanged hyperinflation suggesting COPD. No active cardiopulmonary disease. Electronically Signed   By: Ulyses Jarred M.D.   On: 03/23/2017 22:21    EKG: Independently reviewed.  Sinus rhythm with short PR  interval  Assessment/Plan COPD exacerbation, chronic respiratory failure with hypoxia: Acute on chronic.  Patient presents with progressively worsening shortness of breath and cough. CXR negative for any acute infiltrate and signs of COPD.   - Admit to a telemetry bed - Continuous pulse oximetry overnight with nasal cannula oxygen - Add on respiratory virus panel  - check rapid strep - Duonebs 4 times daily and as needed shortness of breath/wheezing - Brovana and budesonide neb - Solu-Medrol IV - Azithromycin IV  - Mucinex  Normocytic normochromic anemia: stable. H emoglobin 12.1  - Repeat CBC in a.m.  History of prostate and colon cancer   Tobacco use - Counseled on need of cessation of tobacco  DVT prophylaxis: Lovenox Code Status: Full code Family Communication: No family present Disposition Plan: Likely discharge in 1-2 days Consults called: None Admission status: Observation  Norval Morton MD [facility] Pager [phone]   If 7PM-7AM, please contact night-coverage [email or web address] Password [email or web address]  03/24/2017, 12:05 AM

## 2017-03-24 NOTE — Plan of Care (Signed)
  Progressing Education: Knowledge of General Education information will improve 03/24/2017 2141 - Progressing by Talbert Forest, RN Health Behavior/Discharge Planning: Ability to manage health-related needs will improve 03/24/2017 2141 - Progressing by Talbert Forest, RN Elimination: Will not experience complications related to urinary retention 03/24/2017 2141 - Progressing by Talbert Forest, RN Pain Managment: General experience of comfort will improve 03/24/2017 2141 - Progressing by Talbert Forest, RN Safety: Ability to remain free from injury will improve 03/24/2017 2141 - Progressing by Talbert Forest, RN Skin Integrity: Risk for impaired skin integrity will decrease 03/24/2017 2141 - Progressing by Talbert Forest, RN

## 2017-03-24 NOTE — Progress Notes (Signed)
Taylor [name] is a 62 yo male with PMH significant for COPD on 2L continuously, bipolar disorder, tobacco use disorder, prostate and colon cancer who presented to the ED with complaints of gradually worsening dyspnea and productive cough over the last week. Presented to the ED 2 days ago but unable to fill his prescriptions due to lack of funds. He lives in a [place]. Admitted for acute COPD exacerbation. Initiated on breathing treatments and IV steroids. Afebrile no leukocytosis. Chest xray no consolidation as reviewed by self.  03/24/17: seen and examined. Requests to be restarted on lexapro. States he feels depressed. Was off the medication for 1 month.   Please refer to H&P dictated by Dr Tamala Julian for further assessment and recommendations.

## 2017-03-24 NOTE — Progress Notes (Signed)
   03/24/17 1500  Clinical Encounter Type  Visited With Patient;Health care provider (sitter)  Visit Type Initial  Referral From Physician  Consult/Referral To Chaplain   Responded to a SCC for HCPA.  Patient was dosing on and off with sitter in the room.  In speaking with the nurse now was not the right time to discuss the [place].  Will follow and support as needed. Chaplain Katherene Ponto

## 2017-03-24 NOTE — Progress Notes (Signed)
Dr. [place] Crane stated patient expressed suicied ideation and to to place patient on suicide precaution and she will be consulting psychiatrist to see patient. Verified with patient and patient did say he has thoughts to commit  suicide.  Suicide precaution initiated, AC, charge nurse notified, and sitter at the bedside. Will continue to assess patient.

## 2017-03-24 NOTE — ED Notes (Signed)
ED TO INPATIENT HANDOFF REPORT  Name/Age/Gender Taylor [name] 62 y.o. male  Code Status    Code Status Orders  (From admission, onward)        Start     Ordered   03/24/17 0026  Full code  Continuous     03/24/17 0036    Code Status History    Date Active Date Inactive Code Status Order ID Comments User Context   10/22/2016 22:41 10/24/2016 19:01 DNR [id number]  Karmen Bongo, MD ED   01/09/2013 23:41 01/22/2013 19:59 Full Code [id number]  Toy Baker, MD Inpatient   02/03/2012 17:04 02/06/2012 16:26 Full Code [id number]  Stacie Glaze, RN ED      Home/SNF/Other Home  Chief Complaint Shortness of breath  Level of Care/Admitting Diagnosis ED Disposition    ED Disposition Condition [facility] Area: [facility] [100102]  Level of Care: Telemetry [5]  Admit to tele based on following criteria: Complex arrhythmia (Bradycardia/Tachycardia)  Diagnosis: COPD exacerbation [facility]) [379024]  Admitting Physician: Norval Morton [[id number]]  Attending Physician: Norval Morton [[id number]]  PT Class (Do Not Modify): Observation [104]  PT Acc Code (Do Not Modify): Observation [10022]       Medical History Past Medical History:  Diagnosis Date  . Bipolar 1 disorder ([place]) 02/05/2012  . Cancer ([place]) 04/11/11   adenocarcinoma of colon, 7/19 nodes pos.FINISHED CHEMO/DR. SHERRILL  . COPD (chronic obstructive pulmonary disease) ([place])    SMOKER  . Depression    Bipolar disorder  . Full dentures   . Hemorrhoids   . Inguinal hernia    RIGHT- PAINFUL  . Prostate cancer ([place])    Gleason score = 7, supposed to have radiation therapy but he has not followed up  . Rib fractures    hx of  . Shortness of breath     Allergies Allergies  Allergen Reactions  . Codeine Nausea And Vomiting    IV Location/Drains/Wounds Patient Lines/Drains/Airways Status   Active Line/Drains/Airways    Name:   Placement date:   Placement time:    Site:   Days:   Peripheral IV 03/23/17 Left Forearm   03/23/17    -    Forearm   1          Labs/Imaging Results for orders placed or performed during the hospital encounter of 03/23/17 (from the past 48 hour(s))  CBC with Differential     Status: Abnormal   Collection Time: 03/23/17 10:29 PM  Result Value Ref Range   WBC 8.4 4.0 - 10.5 K/uL   RBC 3.83 (L) 4.22 - 5.81 MIL/uL   Hemoglobin 12.1 (L) 13.0 - 17.0 g/dL   HCT 38.0 (L) 39.0 - 52.0 %   MCV 99.2 78.0 - 100.0 fL   MCH 31.6 26.0 - 34.0 pg   MCHC 31.8 30.0 - 36.0 g/dL   RDW 13.9 11.5 - 15.5 %   Platelets 265 150 - 400 K/uL   Neutrophils Relative % 66 %   Neutro Abs 5.6 1.7 - 7.7 K/uL   Lymphocytes Relative 22 %   Lymphs Abs 1.8 0.7 - 4.0 K/uL   Monocytes Relative 11 %   Monocytes Absolute 0.9 0.1 - 1.0 K/uL   Eosinophils Relative 1 %   Eosinophils Absolute 0.1 0.0 - 0.7 K/uL   Basophils Relative 0 %   Basophils Absolute 0.0 0.0 - 0.1 K/uL    Comment: Performed at [facility], [address]  [place], [place] [address]  Basic metabolic panel     Status: Abnormal   Collection Time: 03/23/17 10:29 PM  Result Value Ref Range   Sodium 143 135 - 145 mmol/L   Potassium 4.1 3.5 - 5.1 mmol/L   Chloride 103 101 - 111 mmol/L   CO2 33 (H) 22 - 32 mmol/L   Glucose, Bld 103 (H) 65 - 99 mg/dL   BUN 20 6 - 20 mg/dL   Creatinine, Ser 0.82 0.61 - 1.24 mg/dL   Calcium 8.6 (L) 8.9 - 10.3 mg/dL   GFR calc non Af Amer >60 >60 mL/min   GFR calc Af Amer >60 >60 mL/min    Comment: (NOTE) The eGFR has been calculated using the CKD EPI equation. This calculation has not been validated in all clinical situations. eGFR's persistently <60 mL/min signify possible Chronic Kidney Disease.    Anion gap 7 5 - 15    Comment: Performed at [facility], [place] [address] [place], [place] [address]  Influenza panel by PCR (type A & B)     Status: None   Collection Time: 03/23/17 10:29 PM  Result Value Ref  Range   Influenza A By PCR NEGATIVE NEGATIVE   Influenza B By PCR NEGATIVE NEGATIVE    Comment: (NOTE) The Xpert Xpress Flu assay is intended as an aid in the diagnosis of  influenza and should not be used as a sole basis for treatment.  This  assay is FDA approved for nasopharyngeal swab specimens only. Nasal  washings and aspirates are unacceptable for Xpert Xpress Flu testing. Performed at [facility], [place] [address], [place], [place] [address]    Dg Chest 2 View  Result Date: 03/23/2017 CLINICAL DATA:  Shortness of breath EXAM: CHEST  2 VIEW COMPARISON:  03/21/2017 FINDINGS: Lungs are hyperinflated. The heart size and mediastinal contours are within normal limits. Both lungs are clear. The visualized skeletal structures are unremarkable. IMPRESSION: Unchanged hyperinflation suggesting COPD. No active cardiopulmonary disease. Electronically Signed   By: Ulyses Jarred M.D.   On: 03/23/2017 22:21    Pending Labs Unresulted Labs (From admission, onward)   Start     Ordered   03/24/17 0500  CBC  Tomorrow morning,   R     03/24/17 0036   03/24/17 0118  Rapid Strep Screen (Not at [facility])  Once,   R     03/24/17 0117   03/24/17 0036  Respiratory Panel by PCR  (Respiratory virus panel)  Add-on,   R     03/24/17 0036      Vitals/Pain Today's Vitals   03/24/17 0130 03/24/17 0140 03/24/17 0200 03/24/17 0215  BP: 133/82  130/82   Pulse: 85  82 82  Resp: 17   19  Temp:      TempSrc:      SpO2: 94% 95% 95% 96%  Weight:      Height:      PainSc:        Isolation Precautions Droplet precaution  Medications Medications  ipratropium-albuterol (DUONEB) 0.5-2.5 (3) MG/3ML nebulizer solution 3 mL (not administered)  ipratropium-albuterol (DUONEB) 0.5-2.5 (3) MG/3ML nebulizer solution 3 mL (not administered)  enoxaparin (LOVENOX) injection 40 mg (40 mg Subcutaneous Given 03/24/17 0152)  ondansetron (ZOFRAN) tablet 4 mg (not administered)    Or  ondansetron (ZOFRAN)  injection 4 mg (not administered)  acetaminophen (TYLENOL) tablet 650 mg (not administered)    Or  acetaminophen (TYLENOL) suppository 650 mg (not administered)  guaiFENesin (MUCINEX) 12 hr tablet 600 mg (600 mg Oral Given 03/24/17 0153)  azithromycin (ZITHROMAX) 500 mg in sodium chloride 0.9 % 250 mL IVPB (500 mg Intravenous New Bag/Given 03/24/17 0151)  methylPREDNISolone sodium succinate (SOLU-MEDROL) 125 mg/2 mL injection 60 mg (not administered)  gi cocktail (Maalox,Lidocaine,Donnatal) (not administered)  arformoterol (BROVANA) nebulizer solution 15 mcg (15 mcg Nebulization Given 03/24/17 0140)  budesonide (PULMICORT) nebulizer solution 0.5 mg (0.5 mg Nebulization Given 03/24/17 0140)  albuterol (PROVENTIL) (2.5 MG/3ML) 0.083% nebulizer solution 5 mg (5 mg Nebulization Given 03/23/17 2239)  ipratropium (ATROVENT) nebulizer solution 0.5 mg (0.5 mg Nebulization Given 03/23/17 2238)  methylPREDNISolone sodium succinate (SOLU-MEDROL) 125 mg/2 mL injection 125 mg (125 mg Intravenous Given 03/23/17 2238)  sodium chloride 0.9 % bolus 500 mL (0 mLs Intravenous Stopped 03/24/17 0031)  gi cocktail (Maalox,Lidocaine,Donnatal) (30 mLs Oral Given 03/24/17 0030)  acetaminophen (TYLENOL) tablet 650 mg (650 mg Oral Given 03/24/17 0030)    Mobility walks

## 2017-03-25 DIAGNOSIS — D72829 Elevated white blood cell count, unspecified: Secondary | ICD-10-CM

## 2017-03-25 DIAGNOSIS — F332 Major depressive disorder, recurrent severe without psychotic features: Secondary | ICD-10-CM

## 2017-03-25 LAB — BASIC METABOLIC PANEL
ANION GAP: 10 (ref 5–15)
BUN: 21 mg/dL — ABNORMAL HIGH (ref 6–20)
CHLORIDE: 100 mmol/L — AB (ref 101–111)
CO2: 28 mmol/L (ref 22–32)
Calcium: 8.6 mg/dL — ABNORMAL LOW (ref 8.9–10.3)
Creatinine, Ser: 0.79 mg/dL (ref 0.61–1.24)
GFR calc non Af Amer: >60 mL/min (ref >60)
Glucose, Bld: 262 mg/dL — ABNORMAL HIGH (ref 65–99)
POTASSIUM: 4.6 mmol/L (ref 3.5–5.1)
Sodium: 138 mmol/L (ref 135–145)

## 2017-03-25 LAB — CBC
HEMATOCRIT: 38.2 % — AB (ref 39.0–52.0)
HEMOGLOBIN: 12.5 g/dL — AB (ref 13.0–17.0)
MCH: 31.3 pg (ref 26.0–34.0)
MCHC: 32.7 g/dL (ref 30.0–36.0)
MCV: 95.7 fL (ref 78.0–100.0)
Platelets: 246 10*3/uL (ref 150–400)
RBC: 3.99 MIL/uL — AB (ref 4.22–5.81)
RDW: 13.6 % (ref 11.5–15.5)
WBC: 14.8 10*3/uL — AB (ref 4.0–10.5)

## 2017-03-25 MED ORDER — METHYLPREDNISOLONE SODIUM SUCC 125 MG IJ SOLR
60.0000 mg | Freq: Every day | INTRAMUSCULAR | Status: DC
  Administered 2017-03-26: 60 mg via INTRAVENOUS
  Filled 2017-03-25: qty 2

## 2017-03-25 NOTE — Progress Notes (Addendum)
PROGRESS NOTE  Taylor [name]:[id number] DOB: 07-31-1955 DOA: 03/23/2017 PCP: Patient, No Pcp Per  HPI/Recap of past 24 hours: Taylor [name] is a 62 yo male with PMH significant for COPD on 2L continuously, bipolar disorder, tobacco use disorder, prostate and colon cancer who presented to the ED with complaints of gradually worsening dyspnea and productive cough over the last week. Presented to the ED 2 days ago but unable to fill his prescriptions due to lack of funds. He lives in a [place]. Admitted for acute COPD exacerbation. Initiated on breathing treatments and IV steroids. Afebrile no leukocytosis. Chest xray no consolidation as reviewed by self.  03/24/17: Requests to be restarted on lexapro. States he feels depressed. Was off the medication for 1 month.   03/25/17: Mood is unchanged. States breathing is improved. No chest pain. Cough is intermittent and non productive.   Assessment/Plan: Principal Problem:   COPD exacerbation (HCC) Active Problems:   Chronic respiratory failure with hypoxia (HCC)   Normocytic normochromic anemia  Acute COPD exacerbation -chronic hypoxia on 2L continuously -continue duonebs, brovana, budesonide nebs, mucinex prn -IV solumedrol dose reduced to once a day -continue IV azithromycin -continue O2 suppl to maintain O2 sat 90% or greater -viral respiratory panel, influenza a and b negative  Acute leukocytosis -most likely 2/2 to demargination from steroids use -wbc 14 from 6 -procalcitonin to confirm suspicion -afebrile -does not appear septic -vital signs normal  Chronic hypoxic respiratory failure -on 2L continuously  Chronic Normocytic anemia -stable -no sign of overt bleeding  Hx of prostate cancer -noted  Tobacco use disorder -tobacco cessation -nicotine patch  Depression/anxiety -lexapro restarted 03/24/17 -will need to follow up with [facility] at discharge -reported suicidal ideation -psych consult  -continue  1-1   Code Status: full   Family Communication: none at bedside  Disposition Plan: Homeless; Education officer, museum consulted for assistance.   Consultants:  Social work  Procedures:  none  Antimicrobials:  azithromycin  DVT prophylaxis:  SCDs   Objective: Vitals:   03/24/17 1157 03/24/17 2046 03/24/17 2105 03/25/17 0613  BP: 117/69  133/73 120/76  Pulse: 86  86 87  Resp:   18 20  Temp: 98.4 F (36.9 C)  98 F (36.7 C) 98.1 F (36.7 C)  TempSrc: Oral  Axillary Oral  SpO2: 96% 94% 97% 94%  Weight:      Height:        Intake/Output Summary (Last 24 hours) at 03/25/2017 0801 Last data filed at 03/25/2017 0600 Gross per 24 hour  Intake 490 ml  Output 2150 ml  Net -1660 ml   Filed Weights   03/23/17 2130 03/24/17 0237  Weight: 74.8 kg (165 lb) 76.6 kg (168 lb 14 oz)    Exam:   General:  62 yo CM WD wN NAD A&O x 3   Cardiovascular: RRR no rubs or gallops  Respiratory: mild rales at bases   Abdomen: soft NT ND NBS x4   Musculoskeletal: no focal abnormalities  Skin: no rash  Psychiatry: mood is appropriate    Data Reviewed: CBC: Recent Labs  Lab 03/21/17 2140 03/23/17 2229 03/24/17 0534  WBC 7.6 8.4 6.6  NEUTROABS  --  5.6  --   HGB 12.6* 12.1* 11.9*  HCT 39.5 38.0* 37.0*  MCV 98.0 99.2 98.4  PLT 229 265 956   Basic Metabolic Panel: Recent Labs  Lab 03/21/17 2140 03/23/17 2229  NA 140 143  K 4.1 4.1  CL 100* 103  CO2 32 33*  GLUCOSE 123* 103*  BUN 6 20  CREATININE 0.95 0.82  CALCIUM 8.4* 8.6*   GFR: Estimated Creatinine Clearance: 99.5 mL/min (by C-G formula based on SCr of 0.82 mg/dL). Liver Function Tests: No results for input(s): AST, ALT, ALKPHOS, BILITOT, PROT, ALBUMIN in the last 168 hours. No results for input(s): LIPASE, AMYLASE in the last 168 hours. No results for input(s): AMMONIA in the last 168 hours. Coagulation Profile: No results for input(s): INR, PROTIME in the last 168 hours. Cardiac Enzymes: No results for  input(s): CKTOTAL, CKMB, CKMBINDEX, TROPONINI in the last 168 hours. BNP (last 3 results) No results for input(s): PROBNP in the last 8760 hours. HbA1C: No results for input(s): HGBA1C in the last 72 hours. CBG: No results for input(s): GLUCAP in the last 168 hours. Lipid Profile: No results for input(s): CHOL, HDL, LDLCALC, TRIG, CHOLHDL, LDLDIRECT in the last 72 hours. Thyroid Function Tests: No results for input(s): TSH, T4TOTAL, FREET4, T3FREE, THYROIDAB in the last 72 hours. Anemia Panel: No results for input(s): VITAMINB12, FOLATE, FERRITIN, TIBC, IRON, RETICCTPCT in the last 72 hours. Urine analysis:    Component Value Date/Time   COLORURINE YELLOW 10/18/2016 1855   APPEARANCEUR HAZY (A) 10/18/2016 1855   LABSPEC 1.028 10/18/2016 1855   PHURINE 5.0 10/18/2016 1855   GLUCOSEU NEGATIVE 10/18/2016 1855   HGBUR NEGATIVE 10/18/2016 1855   BILIRUBINUR NEGATIVE 10/18/2016 1855   KETONESUR NEGATIVE 10/18/2016 1855   PROTEINUR 100 (A) 10/18/2016 1855   UROBILINOGEN 1.0 01/09/2013 1843   NITRITE NEGATIVE 10/18/2016 1855   LEUKOCYTESUR NEGATIVE 10/18/2016 1855   Sepsis Labs: @LABRCNTIP (procalcitonin:4,lacticidven:4)  ) Recent Results (from the past 240 hour(s))  Respiratory Panel by PCR     Status: None   Collection Time: 03/23/17 10:29 PM  Result Value Ref Range Status   Adenovirus NOT DETECTED NOT DETECTED Final   Coronavirus 229E NOT DETECTED NOT DETECTED Final   Coronavirus HKU1 NOT DETECTED NOT DETECTED Final   Coronavirus NL63 NOT DETECTED NOT DETECTED Final   Coronavirus OC43 NOT DETECTED NOT DETECTED Final   Metapneumovirus NOT DETECTED NOT DETECTED Final   Rhinovirus / Enterovirus NOT DETECTED NOT DETECTED Final   Influenza A NOT DETECTED NOT DETECTED Final   Influenza B NOT DETECTED NOT DETECTED Final   Parainfluenza Virus 1 NOT DETECTED NOT DETECTED Final   Parainfluenza Virus 2 NOT DETECTED NOT DETECTED Final   Parainfluenza Virus 3 NOT DETECTED NOT DETECTED  Final   Parainfluenza Virus 4 NOT DETECTED NOT DETECTED Final   Respiratory Syncytial Virus NOT DETECTED NOT DETECTED Final   Bordetella pertussis NOT DETECTED NOT DETECTED Final   Chlamydophila pneumoniae NOT DETECTED NOT DETECTED Final   Mycoplasma pneumoniae NOT DETECTED NOT DETECTED Final    Comment: Performed at [facility], [place] [address], [place], [place] [address]  Rapid Strep Screen (Not at [facility])     Status: None   Collection Time: 03/24/17  3:40 AM  Result Value Ref Range Status   Streptococcus, Group A Screen (Direct) NEGATIVE NEGATIVE Final    Comment: (NOTE) A Rapid Antigen test may result negative if the antigen level in the sample is below the detection level of this test. The FDA has not cleared this test as a stand-alone test therefore the rapid antigen negative result has reflexed to a Group A Strep culture. Performed at [facility], [place] [address], [place], [place] [address]       Studies: No results found.  Scheduled Meds: . arformoterol  15 mcg Nebulization BID  . budesonide (PULMICORT) nebulizer solution  0.5 mg Nebulization BID  . enoxaparin (LOVENOX) injection  40 mg Subcutaneous QHS  . escitalopram  20 mg Oral Q breakfast  . guaiFENesin  600 mg Oral BID  . ipratropium-albuterol  3 mL Nebulization TID  . mouth rinse  15 mL Mouth Rinse BID  . [START ON 03/26/2017] methylPREDNISolone (SOLU-MEDROL) injection  60 mg Intravenous Daily    Continuous Infusions: . azithromycin Stopped (03/24/17 2223)     LOS: 1 day     Kayleen Memos, MD [facility] Pager [phone]  If 7PM-7AM, please contact night-coverage [email or web address] Password [email or web address] 03/25/2017, 8:01 AM

## 2017-03-25 NOTE — Plan of Care (Signed)
  Progressing Education: Knowledge of General Education information will improve 03/25/2017 2253 - Progressing by Talbert Forest, RN Health Behavior/Discharge Planning: Ability to manage health-related needs will improve 03/25/2017 2253 - Progressing by Talbert Forest, RN Clinical Measurements: Ability to maintain clinical measurements within normal limits will improve 03/25/2017 2253 - Progressing by Talbert Forest, RN Will remain free from infection 03/25/2017 2253 - Progressing by Talbert Forest, RN Diagnostic test results will improve 03/25/2017 2253 - Progressing by Talbert Forest, RN Respiratory complications will improve 03/25/2017 2253 - Progressing by Talbert Forest, RN Cardiovascular complication will be avoided 03/25/2017 2253 - Progressing by Talbert Forest, RN Nutrition: Adequate nutrition will be maintained 03/25/2017 2253 - Progressing by Talbert Forest, RN Coping: Level of anxiety will decrease 03/25/2017 2253 - Progressing by Talbert Forest, RN Elimination: Will not experience complications related to bowel motility 03/25/2017 2253 - Progressing by Talbert Forest, RN Will not experience complications related to urinary retention 03/25/2017 2253 - Progressing by Talbert Forest, RN Pain Managment: General experience of comfort will improve 03/25/2017 2253 - Progressing by Talbert Forest, RN Safety: Ability to remain free from injury will improve 03/25/2017 2253 - Progressing by Talbert Forest, RN Skin Integrity: Risk for impaired skin integrity will decrease 03/25/2017 2253 - Progressing by Talbert Forest, RN

## 2017-03-25 NOTE — Clinical Social Work Note (Signed)
Clinical Social Work Assessment  Patient Details  Name: Taylor [name] MRN: [id number] Date of Birth: 1955-06-16  Date of referral:  03/25/17               Reason for consult:  Housing Concerns/Homelessness, Other (Comment Required)(cannot pay for meds; issues with social security and [place])                Permission sought to share information with:    Permission granted to share information::     Name::        Agency::     Relationship::     Contact Information:     Housing/Transportation Living arrangements for the past 2 months:  No permanent address, Hotel/Motel Source of Information:  Patient Patient Interpreter Needed:  None Criminal Activity/Legal Involvement Pertinent to Current Situation/Hospitalization:  No - Comment as needed Significant Relationships:  Friend Lives with:  Self Do you feel safe going back to the place where you live?  (Patient reported that he does not know where he will go at discharge) Need for family participation in patient care:  No (Coment)  Care giving concerns:  Patient reported that he has been staying at an extended stay motel that costs 250/week. Patient reported that he receives 734/month in SSI and is waiting to hear back from disability to receive additional income. Patient reported that he got his check on February 20th and does not have anymore money left. Patient reported that he does not know where he will go at discharge. Patient reported that his oxygen has been a barrier to finding housing.   Social Worker assessment / plan:  CSW spoke with patient at bedside regarding consult for housing and other issues. CSW and patient discussed patient's recent housing history. Patient reported that he was staying [facility] and recently has been staying at [employer] and prior to that he was incarcerated. Patient reported that he was released from jail in January 2017 after serving 2 1/2 years and that he has not had stable housing in the  past four years. Patient reported that he was staying at [employer] in [facility] prior to [employer] and that he cannot return due to his oxygen. Patient reported that he likes staying at the motel but that he does not have enough money to cover it. CSW inquired about patient's discharge plans patient reported that he does not know where he is going because he spent all his money. CSW inquired patient's expenses, patient reported that he bought food and had to pay someone back for borrowing money. CSW inquired if patient could stay with family/friends until he receives his next check. Patient reported that he cant stay with anyone because he is on the sexual offender registry. Patient reported that he has a Chief Executive Officer that he is working on getting him off the sexual offender registry. CSW provided patient with shelter list and [place] crisis assistance programs handout. CSW encouraged patient to contact shelters and crisis assistance programs to see where beds were available and to inquire about available funds. CSW informed patient about [email or web address] and provided education about how patient could search for a home based on his needs. CSW encouraged patient to start calling places while he was in the hospital.  Per chart review, psych consulted.   CSW provided resources to assist patient with planning for dc. CSW signing off, no other needs identified at this time. Re-consult if new needs arise.    Employment  status:  Unemployed, Disabled (Comment on whether or not currently receiving Disability)(Patient reported that he is waiting to hear back from disability ) Insurance information:  Self Pay (Medicaid Pending) PT Recommendations:  Not assessed at this time Information / Referral to community resources:  Shelter([facility]; )  Patient/Family's Response to care:  Patient appreciative of resources provided by CSW. Patient verbalized uncertainty about  discharge plans.   Patient/Family's Understanding of and Emotional Response to Diagnosis, Current Treatment, and Prognosis:  Patient presented calm and engaged during assessment. Patient informed CSW about recent housing history and uncertainty about discharge plans. CSW validated patient's concerns and provided resources that could assist patient in planning for discharge.   Emotional Assessment Appearance:  Appears stated age Attitude/Demeanor/Rapport:  Engaged Affect (typically observed):  Calm Orientation:  Oriented to Self, Oriented to Place, Oriented to  Time, Oriented to Situation Alcohol / Substance use:  Not Applicable Psych involvement (Current and /or in the community):  Yes (Comment)(Per chart review psych consulted)  Discharge Needs  Concerns to be addressed:  Homelessness, Financial / Insurance Concerns Readmission within the last 30 days:  No Current discharge risk:  Homeless Barriers to Discharge:  Continued Medical Work up   [employer], LCSW 03/25/2017, 3:25 PM

## 2017-03-25 NOTE — Evaluation (Signed)
Physical Therapy Evaluation Patient Details Name: Taylor [name] MRN: [id number] DOB: 06/09/1955 Today's Date: 03/25/2017   History of Present Illness  62 yo male with PMH significant for COPD on 2L continuously, bipolar disorder, tobacco use disorder, prostate and colon cancer and admitted for acute COPD exacerbation  Clinical Impression  Pt admitted with above diagnosis. Pt currently with functional limitations due to the deficits listed below (see PT Problem List).  Pt will benefit from skilled PT to increase their independence and safety with mobility to allow discharge to the venue listed below.  Pt assisted with ambulating in hallway and reports dyspnea, fatigue, and generalized weakness.  Pt dropped to 88% on 2L O2 [name] end of ambulation.  Pt reports he lives in a motel and has an oxygen concentrator with long cord.  Pt states his mobility has decreased, requires increased rest breaks, and fatigues more quickly over the last month.  Pt may benefit from SNF prior to return to motel alone.  If unable to d/c to SNF, HHPT if available.     Follow Up Recommendations SNF    Equipment Recommendations  None recommended by PT    Recommendations for Other Services       Precautions / Restrictions Precautions Precaution Comments: monitor sats      Mobility  Bed Mobility Overal bed mobility: Modified Independent                Transfers Overall transfer level: Needs assistance Equipment used: None Transfers: Sit to/from Stand Sit to Stand: Min guard         General transfer comment: min/guard for safety, shaky UEs with fatigue  Ambulation/Gait Ambulation/Gait assistance: Min guard Ambulation Distance (Feet): 60 Feet(x2) Assistive device: None Gait Pattern/deviations: Step-through pattern;Decreased stride length     General Gait Details: pt pushed IV pole, SpO2 dropped to 83% room air, 2L O2 applied and SPO2 improved to 92% with pursed lip breathing, dropped to 88%  on 2L O2 [name] upon returning to bed  Stairs            Wheelchair Mobility    Modified Rankin (Stroke Patients Only)       Balance                                             Pertinent Vitals/Pain Pain Assessment: No/denies pain    Home Living Family/patient expects to be discharged to:: Other (Comment)(motel)                      Prior Function Level of Independence: Independent         Comments: 2L O2 [name] baseline     Hand Dominance        Extremity/Trunk Assessment        Lower Extremity Assessment Lower Extremity Assessment: Generalized weakness       Communication   Communication: No difficulties  Cognition Arousal/Alertness: Awake/alert Behavior During Therapy: WFL for tasks assessed/performed Overall Cognitive Status: Within Functional Limits for tasks assessed                                        General Comments      Exercises     Assessment/Plan    PT Assessment Patient needs continued PT  services  PT Problem List Decreased strength;Decreased mobility;Decreased activity tolerance;Decreased knowledge of use of DME;Cardiopulmonary status limiting activity       PT Treatment Interventions DME instruction;Therapeutic activities;Therapeutic exercise;Gait training;Patient/family education;Stair training;Functional mobility training    PT Goals (Current goals can be found in the Care Plan section)  Acute Rehab PT Goals PT Goal Formulation: With patient Time For Goal Achievement: 04/01/17 Potential to Achieve Goals: Good    Frequency Min 3X/week   Barriers to discharge        Co-evaluation               AM-PAC PT "6 Clicks" Daily Activity  Outcome Measure Difficulty turning over in bed (including adjusting bedclothes, sheets and blankets)?: None Difficulty moving from lying on back to sitting on the side of the bed? : A Little Difficulty sitting down on and standing up from a  chair with arms (e.g., wheelchair, bedside commode, etc,.)?: Unable Help needed moving to and from a bed to chair (including a wheelchair)?: A Little Help needed walking in hospital room?: A Little Help needed climbing 3-5 steps with a railing? : A Lot 6 Click Score: 16    End of Session Equipment Utilized During Treatment: Gait belt;Oxygen Activity Tolerance: Patient limited by fatigue Patient left: in bed;with call bell/phone within reach;with nursing/sitter in room Nurse Communication: Mobility status PT Visit Diagnosis: Difficulty in walking, not elsewhere classified (R26.2)    Time: 3154-0086 PT Time Calculation (min) (ACUTE ONLY): 20 min   Charges:   PT Evaluation $PT Eval Low Complexity: 1 Low     PT G CodesCarmelia Bake, PT, DPT 03/25/2017 Pager: [phone]   [name] 03/25/2017, 3:40 PM

## 2017-03-26 DIAGNOSIS — Z590 Homelessness: Secondary | ICD-10-CM

## 2017-03-26 DIAGNOSIS — F17210 Nicotine dependence, cigarettes, uncomplicated: Secondary | ICD-10-CM

## 2017-03-26 DIAGNOSIS — R450 Nervousness: Secondary | ICD-10-CM

## 2017-03-26 DIAGNOSIS — R109 Unspecified abdominal pain: Secondary | ICD-10-CM

## 2017-03-26 DIAGNOSIS — R12 Heartburn: Secondary | ICD-10-CM

## 2017-03-26 DIAGNOSIS — F329 Major depressive disorder, single episode, unspecified: Secondary | ICD-10-CM

## 2017-03-26 DIAGNOSIS — F419 Anxiety disorder, unspecified: Secondary | ICD-10-CM

## 2017-03-26 DIAGNOSIS — J449 Chronic obstructive pulmonary disease, unspecified: Secondary | ICD-10-CM

## 2017-03-26 DIAGNOSIS — Z818 Family history of other mental and behavioral disorders: Secondary | ICD-10-CM

## 2017-03-26 LAB — CULTURE, GROUP A STREP (THRC)

## 2017-03-26 MED ORDER — ARIPIPRAZOLE 5 MG PO TABS
5.0000 mg | ORAL_TABLET | Freq: Every day | ORAL | Status: DC
  Administered 2017-03-26 – 2017-03-27 (×2): 5 mg via ORAL
  Filled 2017-03-26 (×2): qty 1

## 2017-03-26 MED ORDER — AZITHROMYCIN 250 MG PO TABS
500.0000 mg | ORAL_TABLET | Freq: Every day | ORAL | Status: DC
  Administered 2017-03-26 – 2017-03-27 (×2): 500 mg via ORAL
  Filled 2017-03-26 (×2): qty 2

## 2017-03-26 NOTE — Care Management Note (Signed)
Case Management Note  Patient Details  Name: OSHA ERRICO MRN: [id number] Date of Birth: 07/04/1955  Subjective/Objective: 62 y/o m admitted w/COPD.Hx: bipolar. From home. PT recc SNF. 1:1-SI.Psych cons-await recc.                   Action/Plan:d/c plan SNF.   Expected Discharge Date:                  Expected Discharge Plan:  Skilled Nursing Facility  In-House Referral:  Clinical Social Work  Discharge planning Services  CM Consult  Post Acute Care Choice:    Choice offered to:     DME Arranged:    DME Agency:     HH Arranged:    [place] Agency:     Status of Service:  In process, will continue to follow  If discussed at Long Length of Stay Meetings, dates discussed:    Additional Comments:  Dessa Phi, RN 03/26/2017, 12:10 PM

## 2017-03-26 NOTE — Care Management Note (Signed)
Case Management Note  Patient Details  Name: Taylor [name] MRN: [id number] Date of Birth: [birth year]  Subjective/Objective: CM/CSW went into rm to discuss d/c plans-Patient will f/u @ [facility]spoke to [facility] are aware of patient coming-they supply medical treatment,meds,initiate overnight stay process,ither resources-[facility]. Patient must get there by 1p. Patient on home 02-has travel tank-he states he has his own transportation home,friend will bring travel tank. No further CM needs.                   Action/Plan:d/c plan homeless shelter   Expected Discharge Date:                  Expected Discharge Plan:  Homeless Shelter  In-House Referral:  Clinical Social Work  Discharge planning Services  CM Consult  Post Acute Care Choice:  Durable Medical Equipment(Has home 02-has travel tank) Choice offered to:     DME Arranged:    DME Agency:     HH Arranged:    [place] Agency:     Status of Service:  In process, will continue to follow  If discussed at Long Length of Stay Meetings, dates discussed:    Additional Comments:  Dessa Phi, RN 03/26/2017, 2:31 PM

## 2017-03-26 NOTE — Progress Notes (Signed)
CSW following to assist with discharge planning. Per chart review, PT recommended "SNF if unable to d/c to SNF, HHPT if available". Patient not appropriate for SNF placement due to his status on [place] Sex offender registry. Patient reported that he is currently homeless due to running out of money to pay for his motel.   CSW/RNCM followed up with patient regarding discharge plans. CSW informed patient about [place] Sex offender registry barrier to SNF placement, patient verbalized understanding. CSW informed patient that he can follow up at the [facility] and contact local shelters about bed availability and ability to stay while being on [place] sex offender registry. Patient reported that he wants to just have himself committed til he can get his disability. CSW encouraged patient to follow up with [place] and local shelters to find a place to stay.   Per chart review, Patient cleared by pysch. Patient to dc to [facility] when medically stable.  CSW signing off, no other needs identified at this time.  Abundio Miu, [place] Social Worker [facility] Cell#: [phone]

## 2017-03-26 NOTE — Progress Notes (Signed)
PROGRESS NOTE  Taylor [name] EVO:[id number] DOB: [birth year] DOA: 03/23/2017 PCP: Patient, No Pcp Per  HPI/Recap of past 24 hours: [name] is a 62 yo male with PMH significant for COPD on 2L continuously, bipolar disorder, tobacco use disorder, prostate and colon cancer who presented to the ED with complaints of gradually worsening dyspnea and productive cough over the last week. Presented to the ED 2 days ago but unable to fill his prescriptions due to lack of funds. He lives in a [place]. Admitted for acute COPD exacerbation. Initiated on breathing treatments and IV steroids. Afebrile no leukocytosis. Chest xray no consolidation as reviewed by self.  03/24/17: Requests to be restarted on lexapro. States he feels depressed. Was off the medication for 1 month.   03/25/17: Mood is unchanged. States breathing is improved. No chest pain. Cough is intermittent and non productive.   03/26/17: Was seen and examined, stable, improved shortness of breath and wheezing, psych has cleared the patient for discharge to SNF, recommending initiating Abilify No issues overnight Stable    Assessment/Plan: Principal Problem:   COPD exacerbation ([place]) Active Problems:   Chronic respiratory failure with hypoxia (HCC)   Normocytic normochromic anemia  Acute COPD exacerbation -chronic hypoxia on 2L continuously -continue duonebs, brovana, budesonide nebs, mucinex prn -IV solumedrol dose reduced to once a day -continue IV azithromycin change to PO  -continue O2 suppl to maintain O2 sat 90% or greater -viral respiratory panel, influenza a and b negative  Acute leukocytosis -most likely 2/2 to demargination from steroids use -wbc 14 from 6 -afebrile -does not appear septic -vital signs normal  Chronic hypoxic respiratory failure -on 2L continuously  Chronic Normocytic anemia -stable -no sign of overt bleeding  Hx of prostate cancer -noted  Tobacco use disorder -tobacco  cessation -nicotine patch  Depression/anxiety -lexapro restarted 03/24/17 -will need to follow up with [facility] at discharge -reported suicidal ideation, currently stable, denies any homicidal suicidal ideation -psych seen and evaluated patient, recommended initiating Abilify -continue 1-1   Code Status: full   Family Communication: none at bedside  Disposition Plan: Homeless; Education officer, museum consulted for assistance. Cleared to be discharged to SNF soon as bed is available   Consultants:  Social work  Procedures:  none  Antimicrobials:  azithromycin  DVT prophylaxis:  SCDs   Objective: Vitals:   03/25/17 2154 03/26/17 0503 03/26/17 0801 03/26/17 0804  BP: (!) 143/83 124/75    Pulse: 81 83    Resp: 18 18    Temp: 98.5 F (36.9 C) (!) 97.2 F (36.2 C)    TempSrc: Axillary Oral    SpO2: 99% 93% 94% 94%  Weight:      Height:        Intake/Output Summary (Last 24 hours) at 03/26/2017 1229 Last data filed at 03/26/2017 2993 Gross per 24 hour  Intake 250 ml  Output 1425 ml  Net -1175 ml   Filed Weights   03/23/17 2130 03/24/17 0237  Weight: 74.8 kg (165 lb) 76.6 kg (168 lb 14 oz)    Exam: BP 124/75 (BP Location: Right Arm)   Pulse 83   Temp (!) 97.2 F (36.2 C) (Oral)   Resp 18   Ht 5\' 11"  (1.803 m)   Wt 76.6 kg (168 lb 14 oz)   SpO2 94%   BMI 23.55 kg/m    Physical Exam  Constitution:  Alert, cooperative, no distress,  Psychiatric: Normal and stable mood and affect, cognition intact, stable, denies any homicidal suicidal ideation  HEENT: Normocephalic, PERRL, otherwise with in Normal limits  Cardio vascular:  S1/S2, RRR, No murmure, No Rubs or Gallops  Chest/pulmonary: improved wheezing, Clear to auscultation bilaterally, respirations unlabored  Chest symmetric Abdomen: Soft, non-tender, non-distended, bowel sounds,no masses, no organomegaly Muscular skeletal: Limited exam - in bed, able to move all 4 extremities, Normal strength,  Neuro: CNII-XII  intact. , normal motor and sensation, reflexes intact  Extremities: No pitting edema lower extremities, +2 pulses  Skin: Dry, warm to touch, negative for any Rashes, No open wounds    Data Reviewed: CBC: Recent Labs  Lab 03/21/17 2140 03/23/17 2229 03/24/17 0534 03/25/17 0819  WBC 7.6 8.4 6.6 14.8*  NEUTROABS  --  5.6  --   --   HGB 12.6* 12.1* 11.9* 12.5*  HCT 39.5 38.0* 37.0* 38.2*  MCV 98.0 99.2 98.4 95.7  PLT 229 265 240 485   Basic Metabolic Panel: Recent Labs  Lab 03/21/17 2140 03/23/17 2229 03/25/17 0819  NA 140 143 138  K 4.1 4.1 4.6  CL 100* 103 100*  CO2 32 33* 28  GLUCOSE 123* 103* 262*  BUN 6 20 21*  CREATININE 0.95 0.82 0.79  CALCIUM 8.4* 8.6* 8.6*   IOE:VOJJK analysis:    Component Value Date/Time   COLORURINE YELLOW 10/18/2016 1855   APPEARANCEUR HAZY (A) 10/18/2016 1855   LABSPEC 1.028 10/18/2016 1855   PHURINE 5.0 10/18/2016 1855   GLUCOSEU NEGATIVE 10/18/2016 1855   HGBUR NEGATIVE 10/18/2016 1855   BILIRUBINUR NEGATIVE 10/18/2016 1855   KETONESUR NEGATIVE 10/18/2016 1855   PROTEINUR 100 (A) 10/18/2016 1855   UROBILINOGEN 1.0 01/09/2013 1843   NITRITE NEGATIVE 10/18/2016 1855   LEUKOCYTESUR NEGATIVE 10/18/2016 1855   Sepsis Labs: @LABRCNTIP (procalcitonin:4,lacticidven:4)  Recent Results (from the past 240 hour(s))  Respiratory Panel by PCR     Status: None   Collection Time: 03/23/17 10:29 PM  Result Value Ref Range Status   Adenovirus NOT DETECTED NOT DETECTED Final   Coronavirus 229E NOT DETECTED NOT DETECTED Final   Coronavirus HKU1 NOT DETECTED NOT DETECTED Final   Coronavirus NL63 NOT DETECTED NOT DETECTED Final   Coronavirus OC43 NOT DETECTED NOT DETECTED Final   Metapneumovirus NOT DETECTED NOT DETECTED Final   Rhinovirus / Enterovirus NOT DETECTED NOT DETECTED Final   Influenza A NOT DETECTED NOT DETECTED Final   Influenza B NOT DETECTED NOT DETECTED Final   Parainfluenza Virus 1 NOT DETECTED NOT DETECTED Final    Parainfluenza Virus 2 NOT DETECTED NOT DETECTED Final   Parainfluenza Virus 3 NOT DETECTED NOT DETECTED Final   Parainfluenza Virus 4 NOT DETECTED NOT DETECTED Final   Respiratory Syncytial Virus NOT DETECTED NOT DETECTED Final   Bordetella pertussis NOT DETECTED NOT DETECTED Final   Chlamydophila pneumoniae NOT DETECTED NOT DETECTED Final   Mycoplasma pneumoniae NOT DETECTED NOT DETECTED Final    Comment: Performed at [facility], [place] [address], [place], [place] [address]  Rapid Strep Screen (Not at [facility])     Status: None   Collection Time: 03/24/17  3:40 AM  Result Value Ref Range Status   Streptococcus, Group A Screen (Direct) NEGATIVE NEGATIVE Final    Comment: (NOTE) A Rapid Antigen test may result negative if the antigen level in the sample is below the detection level of this test. The FDA has not cleared this test as a stand-alone test therefore the rapid antigen negative result has reflexed to a Group A Strep culture. Performed at [facility], [place] [address] [place], [place] [address]   Culture, group A strep     Status: None (Preliminary result)   Collection Time: 03/24/17  3:40 AM  Result Value Ref Range Status   Specimen Description   Final    THROAT Performed at [facility] [address] [place], [place] [address]    Special Requests   Final    NONE Reflexed from [phone] Performed at [facility], [place] [address], [place], [place] [address]    Culture   Final    CULTURE REINCUBATED FOR BETTER GROWTH Performed at [facility], [place] [address] [place], [place] [address]    Report Status PENDING  Incomplete      Studies: No results found.  Scheduled Meds: . arformoterol  15 mcg Nebulization BID  . ARIPiprazole  5 mg Oral Daily  . budesonide (PULMICORT) nebulizer solution  0.5 mg Nebulization BID  . enoxaparin (LOVENOX) injection  40 mg Subcutaneous QHS  . escitalopram  20 mg Oral Q  breakfast  . guaiFENesin  600 mg Oral BID  . ipratropium-albuterol  3 mL Nebulization TID  . mouth rinse  15 mL Mouth Rinse BID  . methylPREDNISolone (SOLU-MEDROL) injection  60 mg Intravenous Daily    Continuous Infusions: . azithromycin Stopped (03/25/17 2255)     LOS: 2 days     Deatra James, MD [facility] Pager [phone]  If 7PM-7AM, please contact night-coverage [email or web address] Password [facility] 03/26/2017, 12:29 PM
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
## 2017-03-26 NOTE — Progress Notes (Signed)
Spoke with Dr. Mariea Clonts from psychiatry to clarify that pt had been cleared from psych as well as sitter discontinued. Orders received.

## 2017-03-27 ENCOUNTER — Other Ambulatory Visit: Payer: Self-pay

## 2017-03-27 ENCOUNTER — Emergency Department (HOSPITAL_COMMUNITY): Payer: Medicaid Other

## 2017-03-27 ENCOUNTER — Encounter (HOSPITAL_COMMUNITY): Payer: Self-pay

## 2017-03-27 ENCOUNTER — Emergency Department (HOSPITAL_COMMUNITY)
Admission: EM | Admit: 2017-03-27 | Discharge: 2017-03-28 | Disposition: A | Discharge status: Home / Self Care | Payer: Medicaid Other | Attending: Emergency Medicine | Admitting: Emergency Medicine

## 2017-03-27 DIAGNOSIS — F339 Major depressive disorder, recurrent, unspecified: Secondary | ICD-10-CM

## 2017-03-27 DIAGNOSIS — J441 Chronic obstructive pulmonary disease with (acute) exacerbation: Secondary | ICD-10-CM | POA: Insufficient documentation

## 2017-03-27 DIAGNOSIS — Z85038 Personal history of other malignant neoplasm of large intestine: Secondary | ICD-10-CM | POA: Insufficient documentation

## 2017-03-27 DIAGNOSIS — R062 Wheezing: Secondary | ICD-10-CM | POA: Insufficient documentation

## 2017-03-27 DIAGNOSIS — F319 Bipolar disorder, unspecified: Secondary | ICD-10-CM | POA: Insufficient documentation

## 2017-03-27 DIAGNOSIS — Z79899 Other long term (current) drug therapy: Secondary | ICD-10-CM | POA: Insufficient documentation

## 2017-03-27 DIAGNOSIS — F17210 Nicotine dependence, cigarettes, uncomplicated: Secondary | ICD-10-CM | POA: Insufficient documentation

## 2017-03-27 DIAGNOSIS — Z8546 Personal history of malignant neoplasm of prostate: Secondary | ICD-10-CM | POA: Insufficient documentation

## 2017-03-27 LAB — CBC
HCT: 40.9 % (ref 39.0–52.0)
HEMATOCRIT: 37.2 % — AB (ref 39.0–52.0)
Hemoglobin: 12.1 g/dL — ABNORMAL LOW (ref 13.0–17.0)
Hemoglobin: 13.4 g/dL (ref 13.0–17.0)
MCH: 31.6 pg (ref 26.0–34.0)
MCH: 31.2 pg (ref 26.0–34.0)
MCHC: 32.5 g/dL (ref 30.0–36.0)
MCHC: 32.8 g/dL (ref 30.0–36.0)
MCV: 97.1 fL (ref 78.0–100.0)
MCV: 95.1 fL (ref 78.0–100.0)
PLATELETS: 249 10*3/uL (ref 150–400)
Platelets: 221 10*3/uL (ref 150–400)
RBC: 3.83 MIL/uL — ABNORMAL LOW (ref 4.22–5.81)
RBC: 4.3 MIL/uL (ref 4.22–5.81)
RDW: 13.7 % (ref 11.5–15.5)
RDW: 13.6 % (ref 11.5–15.5)
WBC: 9.7 10*3/uL (ref 4.0–10.5)
WBC: 9.6 10*3/uL (ref 4.0–10.5)

## 2017-03-27 LAB — BASIC METABOLIC PANEL
Anion gap: 9 (ref 5–15)
BUN: 22 mg/dL — ABNORMAL HIGH (ref 6–20)
CALCIUM: 8.7 mg/dL — AB (ref 8.9–10.3)
CO2: 31 mmol/L (ref 22–32)
CREATININE: 0.83 mg/dL (ref 0.61–1.24)
Chloride: 98 mmol/L — ABNORMAL LOW (ref 101–111)
GFR calc non Af Amer: >60 mL/min (ref >60)
Glucose, Bld: 118 mg/dL — ABNORMAL HIGH (ref 65–99)
Potassium: 3.7 mmol/L (ref 3.5–5.1)
SODIUM: 138 mmol/L (ref 135–145)

## 2017-03-27 MED ORDER — AZITHROMYCIN 500 MG PO TABS: ORAL_TABLET | ORAL | 0 refills | Status: DC

## 2017-03-27 MED ORDER — MOMETASONE FURO-FORMOTEROL FUM 200-5 MCG/ACT IN AERO: INHALATION_SPRAY | RESPIRATORY_TRACT | 0 refills | Status: DC

## 2017-03-27 MED ORDER — METHYLPREDNISOLONE SODIUM SUCC 125 MG IJ SOLR
125.0000 mg | Freq: Once | INTRAMUSCULAR | Status: AC
  Administered 2017-03-27: 125 mg via INTRAVENOUS
  Filled 2017-03-27: qty 2

## 2017-03-27 MED ORDER — ARIPIPRAZOLE 5 MG PO TABS: 5.0000 mg | ORAL_TABLET | ORAL | 0 refills | Status: DC

## 2017-03-27 MED ORDER — LOVASTATIN 20 MG PO TABS: 20.0000 mg | ORAL_TABLET | Freq: Every day | ORAL | 0 refills | Status: DC

## 2017-03-27 MED ORDER — ESCITALOPRAM OXALATE 20 MG PO TABS: 20.0000 mg | ORAL_TABLET | ORAL | 0 refills | Status: DC

## 2017-03-27 MED ORDER — PREDNISONE 20 MG PO TABS: ORAL_TABLET | ORAL | 0 refills | Status: DC

## 2017-03-27 MED ORDER — IPRATROPIUM-ALBUTEROL 0.5-2.5 (3) MG/3ML IN SOLN
3.0000 mL | Freq: Once | RESPIRATORY_TRACT | Status: AC
  Administered 2017-03-27: 3 mL via RESPIRATORY_TRACT
  Filled 2017-03-27: qty 3

## 2017-03-27 MED ORDER — ALBUTEROL SULFATE (2.5 MG/3ML) 0.083% IN NEBU
2.5000 mg | INHALATION_SOLUTION | Freq: Once | RESPIRATORY_TRACT | Status: AC
  Administered 2017-03-27: 2.5 mg via RESPIRATORY_TRACT
  Filled 2017-03-27: qty 3

## 2017-03-27 MED ORDER — OXYCODONE-ACETAMINOPHEN 5-325 MG PO TABS
1.0000 | ORAL_TABLET | Freq: Once | ORAL | Status: AC
  Administered 2017-03-27: 1 via ORAL
  Filled 2017-03-27: qty 1

## 2017-03-27 MED ORDER — LORAZEPAM 2 MG/ML IJ SOLN
0.5000 mg | Freq: Once | INTRAMUSCULAR | Status: AC
  Administered 2017-03-27: 0.5 mg via INTRAVENOUS
  Filled 2017-03-27: qty 1

## 2017-03-27 NOTE — Discharge Summary (Signed)
Physician Discharge Summary [facility]       Patient: Taylor [name]                   Admit date: 03/23/2017   DOB: 10-07-1955             Discharge date:03/27/2017/11:32 AM YKD:[id number]                           PCP: Patient, No Pcp Per Recommendations for Outpatient Follow-up:   1.  Please follow-up with your primary care physician within 1-2 weeks. 2.  Please F/up with [facility]   Discharge Condition: Stable  CODE STATUS:  Full code    Diet recommendation:  Regular healthy diet  ----------------------------------------------------------------------------------------------------------------------  Discharge Diagnoses:   Principal Problem:   Depression Active Problems:   COPD exacerbation ([place])   Chronic respiratory failure with hypoxia (HCC)   Normocytic normochromic anemia   History of present illness : Taylor [name] is a 62 yo male with PMH significant for COPD on 2L continuously, bipolar disorder, tobacco use disorder, prostate and colon cancer who presented to the ED with complaints of gradually worsening dyspnea and productive cough over the last week. Presented to the ED 2 days ago but unable to fill his prescriptions due to lack of funds. He lives in a [place]. Admitted for acute COPD exacerbation. Initiated on breathing treatments and IV steroids. Afebrile no leukocytosis. Chest xray no consolidation as reviewed by self.  03/24/17:Requests to be restarted on lexapro. States he feels depressed. Was off the medication for 1 month.   03/25/17: Mood is unchanged. States breathing is improved. No chest pain. Cough is intermittent and non productive.   03/26/17: Was seen and examined, stable, improved shortness of breath and wheezing, psych has cleared the patient for discharge to SNF, recommending initiating Abilify No issues overnight 03/27/17 - Stable, D/C IV Solumedrol    ----------------------------------------------------------------------------------------------------------------------  Hospital course / Brief Summary: COPD exacerbation ([place])   Chronic respiratory failure with hypoxia (HCC)   Normocytic normochromic anemia  Acute COPD exacerbation -chronic hypoxia on 2L continuously -continue duonebs, brovana, budesonide nebs, mucinex prn -IV solumedrol dose reduced to once a day -continue IV azithromycin change to PO  -continue O2 suppl to maintain O2 sat 90% or greater -viral respiratory panel, influenza a and b negative  Acute leukocytosis -most likely 2/2 to demargination from steroids use -wbc improved  -afebrile -does not appear septic -vital signs were normal  Chronic hypoxic respiratory failure -on 2L continuously  Chronic Normocytic anemia -stable -no sign of overt bleeding  Hx of prostate cancer -noted  Tobacco use disorder -tobacco cessation -nicotine patch  Depression/anxiety -lexapro restarted 03/24/17 -will need to follow up with [facility] at discharge -reported suicidal ideation, currently stable, denies any homicidal suicidal ideation -psych seen and evaluated patient, recommended initiating Abilify -continue 1-1   Code Status: full   Family Communication: none at bedside  Disposition Plan: Homeless; Education officer, museum helped with D/C planning.   Patient cleared by pysch. Patient to dc to [facility]    Consultations:  Psych   Procedures: No admission procedures for hospital encounter.   ----------------------------------------------------------------------------------------------------------------------  Discharge Instructions:   Discharge Instructions    Activity as tolerated - No restrictions   Complete by:  As directed    Diet - low sodium heart healthy   Complete by:  As directed    Discharge instructions   Complete by:  As directed  Please F/up with [facility]   Increase  activity slowly   Complete by:  As directed        Medication List    STOP taking these medications   doxycycline 100 MG capsule Commonly known as:  VIBRAMYCIN   predniSONE 20 MG tablet Commonly known as:  DELTASONE     TAKE these medications   albuterol 108 (90 Base) MCG/ACT inhaler Commonly known as:  PROVENTIL HFA;VENTOLIN HFA Inhale 1-2 puffs into the lungs every 6 (six) hours as needed for wheezing or shortness of breath.   albuterol (2.5 MG/3ML) 0.083% nebulizer solution Commonly known as:  PROVENTIL Take 6 mLs (5 mg total) by nebulization every 6 (six) hours as needed for wheezing or shortness of breath.   ARIPiprazole 5 MG tablet Commonly known as:  ABILIFY Take 1 tablet (5 mg total) by mouth every morning.   azithromycin 500 MG tablet Commonly known as:  ZITHROMAX As written Start taking on:  03/28/2017   escitalopram 20 MG tablet Commonly known as:  LEXAPRO Take 1 tablet (20 mg total) by mouth every morning.   lovastatin 20 MG tablet Commonly known as:  MEVACOR Take 1 tablet (20 mg total) by mouth at bedtime.   mometasone-formoterol 200-5 MCG/ACT Aero Commonly known as:  DULERA INHALE 2 PUFFS INTO THE LUNGS FIRST THING IN THE MORNING THEN ANOTHER 2 PUFFS 12 HOURS LATER.   SPIRIVA RESPIMAT 2.5 MCG/ACT Aers Generic drug:  Tiotropium Bromide Monohydrate Inhale 2 puffs into the lungs daily.   TRELEGY ELLIPTA 100-62.5-25 MCG/INH Aepb Generic drug:  Fluticasone-Umeclidin-Vilant Inhale 1 puff into the lungs daily.      Follow-up Information    [facility] Follow up.   Why:  must get there by 1p. Contact information: [address] [place] [address] [phone]         Allergies  Allergen Reactions  . Codeine Nausea And Vomiting      Procedures/Studies: Dg Chest 2 View  Result Date: 03/23/2017 CLINICAL DATA:  Shortness of breath EXAM: CHEST  2 VIEW COMPARISON:  03/21/2017 FINDINGS: Lungs are hyperinflated. The heart  size and mediastinal contours are within normal limits. Both lungs are clear. The visualized skeletal structures are unremarkable. IMPRESSION: Unchanged hyperinflation suggesting COPD. No active cardiopulmonary disease. Electronically Signed   By: Ulyses Jarred M.D.   On: 03/23/2017 22:21   Dg Chest 2 View  Result Date: 03/21/2017 CLINICAL DATA:  Short of breath for 2 days EXAM: CHEST  2 VIEW COMPARISON:  02/22/2017, 02/17/2017, 01/09/2017 FINDINGS: Hyperinflated lungs with emphysematous disease. No acute pulmonary opacity or pleural effusion. Stable cardiomediastinal silhouette with aortic atherosclerosis. No pneumothorax. Multiple old right-sided rib fractures IMPRESSION: No active cardiopulmonary disease. Hyperinflation with emphysematous disease. Electronically Signed   By: Donavan Foil M.D.   On: 03/21/2017 22:26      Subjective: Patient was seen and examined 03/27/2017, 11:32 AM Patient stable  Today. No acute distress.  No issues overnight Stable for discharge.  Discharge Exam:  Vitals:   03/26/17 2148 03/26/17 2150 03/27/17 0445 03/27/17 0915  BP:   140/84   Pulse:   74   Resp:   18   Temp:   98.1 F (36.7 C)   TempSrc:   Oral   SpO2: 97% 97% 94% 94%  Weight:      Height:        General: Pt lying comfortably in bed & appears in no obvious distress. Cardiovascular: S1 & S2 heard, RRR, S1/S2 +.  No murmurs, rubs, gallops or clicks. No JVD or pedal edema. Respiratory: Clear to auscultation without wheezing, rhonchi or crackles. No increased work of breathing. Abdominal:  Non distended, non tender & soft. No organomegaly or masses appreciated. Normal bowel sounds heard. CNS: Alert and oriented. No focal deficits. Extremities: no edema, no cyanosis    The results of significant diagnostics from this hospitalization (including imaging, microbiology, ancillary and laboratory) are listed below for reference.     Microbiology: Recent Results (from the past 240 hour(s))    Respiratory Panel by PCR     Status: None   Collection Time: 03/23/17 10:29 PM  Result Value Ref Range Status   Adenovirus NOT DETECTED NOT DETECTED Final   Coronavirus 229E NOT DETECTED NOT DETECTED Final   Coronavirus HKU1 NOT DETECTED NOT DETECTED Final   Coronavirus NL63 NOT DETECTED NOT DETECTED Final   Coronavirus OC43 NOT DETECTED NOT DETECTED Final   Metapneumovirus NOT DETECTED NOT DETECTED Final   Rhinovirus / Enterovirus NOT DETECTED NOT DETECTED Final   Influenza A NOT DETECTED NOT DETECTED Final   Influenza B NOT DETECTED NOT DETECTED Final   Parainfluenza Virus 1 NOT DETECTED NOT DETECTED Final   Parainfluenza Virus 2 NOT DETECTED NOT DETECTED Final   Parainfluenza Virus 3 NOT DETECTED NOT DETECTED Final   Parainfluenza Virus 4 NOT DETECTED NOT DETECTED Final   Respiratory Syncytial Virus NOT DETECTED NOT DETECTED Final   Bordetella pertussis NOT DETECTED NOT DETECTED Final   Chlamydophila pneumoniae NOT DETECTED NOT DETECTED Final   Mycoplasma pneumoniae NOT DETECTED NOT DETECTED Final    Comment: Performed at [facility], [address], [place], [place] [address]  Rapid Strep Screen (Not at [facility])     Status: None   Collection Time: 03/24/17  3:40 AM  Result Value Ref Range Status   Streptococcus, Group A Screen (Direct) NEGATIVE NEGATIVE Final    Comment: (NOTE) A Rapid Antigen test may result negative if the antigen level in the sample is below the detection level of this test. The FDA has not cleared this test as a stand-alone test therefore the rapid antigen negative result has reflexed to a Group A Strep culture. Performed at [facility], [place] [address], [place], [place] [address]   Culture, group A strep     Status: None   Collection Time: 03/24/17  3:40 AM  Result Value Ref Range Status   Specimen Description   Final    THROAT Performed at [name] [address] [place], [place] [address]    Special  Requests   Final    NONE Reflexed from [id number] Performed at [facility], [place] [address] [place], [place] [address]    Culture   Final    NO GROUP A STREP (S.PYOGENES) ISOLATED Performed at [facility], [place] [address], [place], [place] [address]    Report Status 03/26/2017 FINAL  Final     Labs: CBC: Recent Labs  Lab 03/21/17 2140 03/23/17 2229 03/24/17 0534 03/25/17 0819 03/27/17 0532  WBC 7.6 8.4 6.6 14.8* 9.7  NEUTROABS  --  5.6  --   --   --   HGB 12.6* 12.1* 11.9* 12.5* 12.1*  HCT 39.5 38.0* 37.0* 38.2* 37.2*  MCV 98.0 99.2 98.4 95.7 97.1  PLT 229 265 240 246 643   Basic Metabolic Panel: Recent Labs  Lab 03/21/17 2140 03/23/17 2229 03/25/17 0819  NA 140 143 138  K 4.1  4.1 4.6  CL 100* 103 100*  CO2 32 33* 28  GLUCOSE 123* 103* 262*  BUN 6 20 21*  CREATININE 0.95 0.82 0.79  CALCIUM 8.4* 8.6* 8.6*   Liver Function Tests: No results for input(s): AST, ALT, ALKPHOS, BILITOT, PROT, ALBUMIN in the last 168 hours. BNP (last 3 results) Recent Labs    12/12/16 1248  BNP 63.3   Cardiac Enzymes: No results for input(s): CKTOTAL, CKMB, CKMBINDEX, TROPONINI in the last 168 hours. CBG: No results for input(s): GLUCAP in the last 168 hours. Hgb A1c No results for input(s): HGBA1C in the last 72 hours. Lipid Profile No results for input(s): CHOL, HDL, LDLCALC, TRIG, CHOLHDL, LDLDIRECT in the last 72 hours. Thyroid function studies No results for input(s): TSH, T4TOTAL, T3FREE, THYROIDAB in the last 72 hours.  Invalid input(s): FREET3 Anemia work up No results for input(s): VITAMINB12, FOLATE, FERRITIN, TIBC, IRON, RETICCTPCT in the last 72 hours. Urinalysis    Component Value Date/Time   COLORURINE YELLOW 10/18/2016 1855   APPEARANCEUR HAZY (A) 10/18/2016 1855   LABSPEC 1.028 10/18/2016 1855   PHURINE 5.0 10/18/2016 1855   GLUCOSEU NEGATIVE 10/18/2016 1855   HGBUR NEGATIVE 10/18/2016 1855   BILIRUBINUR NEGATIVE 10/18/2016 1855    KETONESUR NEGATIVE 10/18/2016 1855   PROTEINUR 100 (A) 10/18/2016 1855   UROBILINOGEN 1.0 01/09/2013 1843   NITRITE NEGATIVE 10/18/2016 1855   LEUKOCYTESUR NEGATIVE 10/18/2016 1855    Time coordinating discharge: Over 30 minutes  SIGNED: Deatra James, MD, FACP, FHM. [facility],  Pager [phone]  If 7PM-7AM, please contact night-coverage [email or web address] 03/27/2017, 11:32 AM

## 2017-03-27 NOTE — Progress Notes (Signed)
Paged MD to write discharge summary.

## 2017-03-27 NOTE — ED Notes (Signed)
Bed: WA08 Expected date:  Expected time:  Means of arrival:  Comments: Fixing sink.

## 2017-03-27 NOTE — ED Triage Notes (Signed)
Pt brought in by EMS for continuing shortness of breath. Hx of COPD. was seen at this facility and discharged today for COPD exacerbation. Additional complaint- pt increasingly SI, has not taken depression medications x1 month.  On 3L/min [place] with an oxygen saturation of 97%

## 2017-03-27 NOTE — ED Provider Notes (Signed)
[facility] DEPT Provider Note   CSN: [id number] Arrival date & time: 03/27/17  1821     History   Chief Complaint Chief Complaint  Patient presents with  . Shortness of Breath    HPI ANDRON MARRAZZO is a 62 y.o. male.  Patient complains of shortness of breath and wheezing.  Patient has a history of COPD   The history is provided by the patient. No language interpreter was used.  Shortness of Breath  This is a new problem. The problem occurs continuously.The current episode started 12 to 24 hours ago. The problem has not changed since onset.Associated symptoms include wheezing. Pertinent negatives include no fever, no headaches, no cough, no chest pain, no abdominal pain and no rash. The problem's precipitants include an emotional upset.    Past Medical History:  Diagnosis Date  . Bipolar 1 disorder ([place]) 02/05/2012  . 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 SHERRILL  . COPD (chronic obstructive pulmonary disease) ([place])    SMOKER  . Depression    Bipolar disorder  . Full dentures   . Hemorrhoids   . Inguinal hernia    RIGHT- PAINFUL  . Prostate cancer ([place])    Gleason score = 7, supposed to have radiation therapy but he has not followed up  . Rib fractures    hx of  . Shortness of breath     Patient Active Problem List   Diagnosis Date Noted  . Chronic respiratory failure with hypoxia ([place]) 03/24/2017  . Normocytic normochromic anemia 03/24/2017  . 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 COPD with acute exacerbation ([place]) 10/22/2016  . Acute bronchitis due to human metapneumovirus 01/16/2013  . Alcohol abuse 01/09/2013  . COPD exacerbation ([place]) 01/09/2013  . Chest pain 01/09/2013  . Acute respiratory failure with hypoxia ([place]) 01/09/2013  . Smoker 12/16/2012  . Inguinal hernia unilateral, non-recurrent, right 05/01/2012  . Dyspepsia 02/05/2012  .  Hyperglycemia 02/04/2012  . COPD  GOLD III 02/03/2012  . Thrombocytopenia ([place]) 02/03/2012  . Depression   . 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 COLON SURGERY  04/11/11   Sigmoid colectomy  . COLOSTOMY REVISION  04/11/2011   Procedure: COLON RESECTION SIGMOID;  Surgeon: [name], MD;  Location: [facility] ORS;  Service: General;  Laterality: N/A;  low anterior colon resection   . INGUINAL HERNIA REPAIR Right 05/01/2012   Procedure: HERNIA REPAIR INGUINAL ADULT;  Surgeon: [name], MD;  Location: [facility];  Service: General;  Laterality: Right;  . INSERTION OF MESH Right 05/01/2012   Procedure: INSERTION OF MESH;  Surgeon: [name], MD;  Location: [facility];  Service: General;  Laterality: Right;  . PORT-A-CATH REMOVAL Left 05/01/2012   Procedure: REMOVAL Infusion Port;  Surgeon: [name], MD;  Location: [facility];  Service: General;  Laterality: Left;  . PORTACATH PLACEMENT    . PORTACATH PLACEMENT  05/02/2011   Procedure: INSERTION PORT-A-CATH;  Surgeon: [name], MD;  Location: [facility];  Service: General;  Laterality: N/A;       Home Medications    Prior to Admission medications   Medication Sig Start Date End Date Taking? Authorizing Provider  albuterol (PROVENTIL HFA;VENTOLIN HFA) 108 (90 Base) MCG/ACT inhaler Inhale 1-2 puffs into the lungs every 6 (six) hours as needed for wheezing or shortness of breath. 12/11/16  Yes Palumbo, April, MD  albuterol (PROVENTIL) (2.5 MG/3ML) 0.083% nebulizer solution Take 6 mLs (5 mg total) by nebulization every 6 (six) hours as needed for wheezing or shortness of breath. 03/22/17  Yes Larene Pickett, PA-C  ARIPiprazole (ABILIFY) 5 MG tablet Take 1 tablet (5 mg total) by mouth every morning. 03/27/17  Yes Shahmehdi, Valeria Batman, MD  azithromycin (ZITHROMAX) 500 MG tablet As written 03/28/17  Yes Shahmehdi, Seyed A, MD  escitalopram (LEXAPRO) 20 MG tablet Take 1 tablet (20 mg total) by mouth every  morning. 03/27/17  Yes Shahmehdi, Seyed A, MD  Fluticasone-Umeclidin-Vilant (TRELEGY ELLIPTA) 100-62.5-25 MCG/INH AEPB Inhale 1 puff into the lungs daily.   Yes [provider]  lovastatin (MEVACOR) 20 MG tablet Take 1 tablet (20 mg total) by mouth at bedtime. 03/27/17  Yes Shahmehdi, Seyed A, MD  mometasone-formoterol (DULERA) 200-5 MCG/ACT AERO INHALE 2 PUFFS INTO THE LUNGS FIRST THING IN THE MORNING THEN ANOTHER 2 PUFFS 12 HOURS LATER. 03/27/17  Yes Shahmehdi, Seyed A, MD  SPIRIVA RESPIMAT 2.5 MCG/ACT AERS Inhale 2 puffs into the lungs daily. 03/22/17  Yes Deno Etienne, DO  predniSONE (DELTASONE) 20 MG tablet 2 tabs po daily x 3 days 03/27/17   [name], MD    Family History Family History  Problem Relation Age of Onset  . Heart disease Father   . Lung cancer Maternal Uncle        smoked    Social History Social History   Tobacco Use  . Smoking status: Current Every Day Smoker    Packs/day: 0.50    Years: 45.00    Pack years: 22.50    Types: Cigarettes    Last attempt to quit: 02/11/2014    Years since quitting: 3.1  . Smokeless tobacco: Former Network engineer Use Topics  . Alcohol use: No    Comment: h/o use in the past, no h/o heavy use  . Drug use: No     Allergies   Codeine   Review of Systems Review of Systems  Constitutional: Negative for appetite change, fatigue and fever.  HENT: Negative for congestion, ear discharge and sinus pressure.   Eyes: Negative for discharge.  Respiratory: Positive for shortness of breath and wheezing. Negative for cough.   Cardiovascular: Negative for chest pain.  Gastrointestinal: Negative for abdominal pain and diarrhea.  Genitourinary: Negative for frequency and hematuria.  Musculoskeletal: Negative for back pain.  Skin: Negative for rash.  Neurological: Negative for seizures and headaches.  Psychiatric/Behavioral: Negative for hallucinations.     Physical Exam Updated Vital Signs BP 133/86   Pulse 79   Temp 98.5 F  (36.9 C) (Oral)   Resp 18   Ht 5\' 11"  (1.803 m)   Wt 75.8 kg (167 lb)   SpO2 95%   BMI 23.29 kg/m   Physical Exam  Constitutional: He is oriented to person, place, and time. He appears well-developed.  HENT:  Head: Normocephalic.  Eyes: Conjunctivae and EOM are normal. No scleral icterus.  Neck: Neck supple. No thyromegaly present.  Cardiovascular: Normal rate and regular rhythm. Exam reveals no gallop and no friction rub.  No murmur heard. Pulmonary/Chest: No stridor. He has wheezes. He has no rales. He exhibits no tenderness.  Abdominal: He exhibits no distension. There is no tenderness. There is no rebound.  Musculoskeletal: Normal range of motion. He exhibits no edema.  Lymphadenopathy:    He has no cervical adenopathy.  Neurological: He is oriented to person, place, and time. He exhibits normal muscle tone. Coordination  normal.  Skin: No rash noted. No erythema.  Psychiatric: He has a normal mood and affect. His behavior is normal.     ED Treatments / Results  Labs (all labs ordered are listed, but only abnormal results are displayed) Labs Reviewed  BASIC METABOLIC PANEL - Abnormal; Notable for the following components:      Result Value   Chloride 98 (*)    Glucose, Bld 118 (*)    BUN 22 (*)    Calcium 8.7 (*)    All other components within normal limits  CBC    EKG  EKG Interpretation None       Radiology Dg Chest Port 1 View  Result Date: 03/27/2017 CLINICAL DATA:  Shortness of breath EXAM: PORTABLE CHEST 1 VIEW COMPARISON:  03/23/2017 03/21/2017, 02/22/2017 FINDINGS: Hyperinflation with emphysematous disease. No focal pulmonary infiltrate or effusion. Normal cardiomediastinal silhouette. No pneumothorax. IMPRESSION: No active disease.  Hyperinflation with emphysematous disease Electronically Signed   By: Donavan Foil M.D.   On: 03/27/2017 19:37    Procedures Procedures (including critical care time)  Medications Ordered in ED Medications    ipratropium-albuterol (DUONEB) 0.5-2.5 (3) MG/3ML nebulizer solution 3 mL (3 mLs Nebulization Given 03/27/17 1931)  albuterol (PROVENTIL) (2.5 MG/3ML) 0.083% nebulizer solution 2.5 mg (2.5 mg Nebulization Given 03/27/17 1931)  methylPREDNISolone sodium succinate (SOLU-MEDROL) 125 mg/2 mL injection 125 mg (125 mg Intravenous Given 03/27/17 1952)  oxyCODONE-acetaminophen (PERCOCET/ROXICET) 5-325 MG per tablet 1 tablet (1 tablet Oral Given 03/27/17 1952)  LORazepam (ATIVAN) injection 0.5 mg (0.5 mg Intravenous Given 03/27/17 1952)  ipratropium-albuterol (DUONEB) 0.5-2.5 (3) MG/3ML nebulizer solution 3 mL (3 mLs Nebulization Given 03/27/17 2104)     Initial Impression / Assessment and Plan / ED Course  I have reviewed the triage vital signs and the nursing notes.  Pertinent labs & imaging results that were available during my care of the patient were reviewed by me and considered in my medical decision making (see chart for details).     Patient with exacerbation of COPD.  Patient improved with 2 neb treatments.  He will be sent home with a 3-day course of prednisone and follow-up with his PCP  Final Clinical Impressions(s) / ED Diagnoses   Final diagnoses:  COPD exacerbation [facility])    ED Discharge Orders        Ordered    predniSONE (DELTASONE) 20 MG tablet     03/27/17 2317       [name], MD 03/27/17 2322

## 2017-03-27 NOTE — Care Management Note (Signed)
Case Management Note  Patient Details  Name: Taylor [name] MRN: [id number] Date of Birth: 1955-11-22  Subjective/Objective: Reminded patient to arrive to [facility] by 1p.No further CM needs.                   Action/Plan:d/c homeless shelter after going to [facility].   Expected Discharge Date:  03/27/17               Expected Discharge Plan:  Homeless Shelter  In-House Referral:  Clinical Social Work  Discharge planning Services  CM Consult  Post Acute Care Choice:  Durable Medical Equipment(Has home 02-has travel tank) Choice offered to:     DME Arranged:    DME Agency:     HH Arranged:    HH Agency:     Status of Service:  Completed, signed off  If discussed at [employer], dates discussed:    Additional Comments:  Dessa Phi, RN 03/27/2017, 11:34 AM

## 2017-03-27 NOTE — Progress Notes (Signed)
Pt waiting for ride. Will be here in 20-30 minutes

## 2017-03-27 NOTE — Progress Notes (Signed)
Before discharge, pt stated that friend was picking him up and would be bringing portable o2. Took pt down for discharge with hospital O2 tank.  Friend arrived without O2. Told pt that he could stay but would have to be readmitted. Pt refused and stated that he would be ok without O2 until he reached his destination. 2nd Archivist carside. Pt left with no O2. No apparent signs of distress upon leaving

## 2017-03-27 NOTE — Progress Notes (Signed)
Reviewed DC instructions with pt. Pt will report to [place]. Pt has no questions about discharge. 3 inhalers returned from pharmacy. Pt states he has all belongings. No apparent signs of distress.

## 2017-03-27 NOTE — Discharge Instructions (Signed)
Follow-up with your doctor next week for recheck 

## 2017-03-28 ENCOUNTER — Emergency Department (EMERGENCY_DEPARTMENT_HOSPITAL)
Admission: EM | Admit: 2017-03-28 | Discharge: 2017-03-28 | Disposition: A | Discharge status: Home / Self Care | Payer: Medicaid Other | Source: Home / Self Care | Attending: Emergency Medicine | Admitting: Emergency Medicine

## 2017-03-28 ENCOUNTER — Other Ambulatory Visit: Payer: Self-pay

## 2017-03-28 ENCOUNTER — Encounter (HOSPITAL_COMMUNITY): Payer: Self-pay | Admitting: Emergency Medicine

## 2017-03-28 DIAGNOSIS — F17210 Nicotine dependence, cigarettes, uncomplicated: Secondary | ICD-10-CM | POA: Insufficient documentation

## 2017-03-28 DIAGNOSIS — Z590 Homelessness: Secondary | ICD-10-CM

## 2017-03-28 DIAGNOSIS — F4325 Adjustment disorder with mixed disturbance of emotions and conduct: Secondary | ICD-10-CM

## 2017-03-28 DIAGNOSIS — J449 Chronic obstructive pulmonary disease, unspecified: Secondary | ICD-10-CM

## 2017-03-28 DIAGNOSIS — Z6379 Other stressful life events affecting family and household: Secondary | ICD-10-CM

## 2017-03-28 DIAGNOSIS — R45851 Suicidal ideations: Secondary | ICD-10-CM

## 2017-03-28 DIAGNOSIS — R450 Nervousness: Secondary | ICD-10-CM

## 2017-03-28 DIAGNOSIS — F419 Anxiety disorder, unspecified: Secondary | ICD-10-CM

## 2017-03-28 DIAGNOSIS — J441 Chronic obstructive pulmonary disease with (acute) exacerbation: Secondary | ICD-10-CM | POA: Insufficient documentation

## 2017-03-28 DIAGNOSIS — Z915 Personal history of self-harm: Secondary | ICD-10-CM

## 2017-03-28 DIAGNOSIS — F4321 Adjustment disorder with depressed mood: Secondary | ICD-10-CM | POA: Insufficient documentation

## 2017-03-28 LAB — RAPID URINE DRUG SCREEN, HOSP PERFORMED
Amphetamines: NOT DETECTED
Barbiturates: NOT DETECTED
Benzodiazepines: NOT DETECTED
Cocaine: NOT DETECTED
Opiates: NOT DETECTED
Tetrahydrocannabinol: NOT DETECTED

## 2017-03-28 LAB — ETHANOL: Alcohol, Ethyl (B): <10 mg/dL (ref <10)

## 2017-03-28 MED ORDER — ESCITALOPRAM OXALATE 10 MG PO TABS
20.0000 mg | ORAL_TABLET | Freq: Every day | ORAL | Status: DC
  Administered 2017-03-28: 20 mg via ORAL
  Filled 2017-03-28: qty 2

## 2017-03-28 MED ORDER — ALUM & MAG HYDROXIDE-SIMETH 200-200-20 MG/5ML PO SUSP: 30.0000 mL | Freq: Four times a day (QID) | ORAL | Status: DC | PRN

## 2017-03-28 MED ORDER — NICOTINE 21 MG/24HR TD PT24: 21.0000 mg | MEDICATED_PATCH | Freq: Every day | TRANSDERMAL | Status: DC

## 2017-03-28 MED ORDER — ARIPIPRAZOLE 5 MG PO TABS
5.0000 mg | ORAL_TABLET | Freq: Every day | ORAL | Status: DC
  Administered 2017-03-28: 5 mg via ORAL
  Filled 2017-03-28: qty 1

## 2017-03-28 NOTE — BH Assessment (Addendum)
Assessment Note  Taylor [name] is an 62 y.o. male, who presents voluntary and unaccompanied to [facility]. Clinician asked the pt, "what brought you to the hospital?" Pt reported, he was released from the hospital earlier however his depression and anxiety worsened. Pt reported, the following stressors: needing to find a place to stay and waiting on his disability check. Pt reported, if discharged from [facility] he will try to kill himself, "whatever it takes." Pt reported, no one wants him to live with them because has to register as a sex offender. Pt reported, he was charged in 1996 and did jail time. Pt reported, previous suicide attempts. Pt denies, HI, AVH, self-injurious behaviors and access to weapons.   Pt denies abuse and substance use. Pt's UDS is pending. Pt denies, being linked to OPT resources (medication management and/or counseling.) Pt denies, previous inpatient admissions.   Pt presents quiet/awake with his head lowered with logical/coherent speech. Pt's mood was depressed/helpless. Pt's affect was congruent with mood. Pt's thought process was coherent/relevant. Pt's insight and impulse control are fair. Pt reported, if discharged from [facility] he could not contract for safety. Pt reported, if inpatient treatment is recommended he will sign-in voluntarily.  Diagnosis: F33.2 Major Depressive Disorder, recurrent, severe without psychotic features.   Past Medical History:  Past Medical History:  Diagnosis Date  . Bipolar 1 disorder ([place]) 02/05/2012  . Cancer ([place]) 04/11/11   adenocarcinoma of colon, 7/19 nodes pos.FINISHED CHEMO/DR. SHERRILL  . COPD (chronic obstructive pulmonary disease) ([place])    SMOKER  . Depression    Bipolar disorder  . Full dentures   . Hemorrhoids   . Inguinal hernia    RIGHT- PAINFUL  . Prostate cancer ([place])    Gleason score = 7, supposed to have radiation therapy but he has not followed up  . Rib fractures    hx of  . Shortness of breath     Past Surgical  History:  Procedure Laterality Date  . COLON SURGERY  04/11/11   Sigmoid colectomy  . COLOSTOMY REVISION  04/11/2011   Procedure: COLON RESECTION SIGMOID;  Surgeon: [name], MD;  [facility];  Service: General;  Laterality: N/A;  low anterior colon resection   . INGUINAL HERNIA REPAIR Right 05/01/2012   Procedure: HERNIA REPAIR INGUINAL ADULT;  Surgeon: [name], MD;  [facility];  Service: General;  Laterality: Right;  . INSERTION OF MESH Right 05/01/2012   Procedure: INSERTION OF MESH;  Surgeon: [name], MD;  [facility];  Service: General;  Laterality: Right;  . PORT-A-CATH REMOVAL Left 05/01/2012   Procedure: REMOVAL Infusion Port;  Surgeon: [name], MD;  [facility];  Service: General;  Laterality: Left;  . PORTACATH PLACEMENT    . PORTACATH PLACEMENT  05/02/2011   Procedure: INSERTION PORT-A-CATH;  Surgeon: [name], MD;  [facility];  Service: General;  Laterality: N/A;    Family History:  Family History  Problem Relation Age of Onset  . Heart disease Father   . Lung cancer Maternal Uncle        smoked    Social History:  reports that he has been smoking cigarettes.  He has a 22.50 pack-year smoking history. He has quit using smokeless tobacco. He reports that he does not drink alcohol or use drugs.  Additional Social History:  Alcohol / Drug Use Pain Medications: See MAR Prescriptions: See MAR Over the Counter: See MAR History of alcohol / drug use?: (  Pt denies. UDS is pending. )  CIWA: CIWA-Ar BP: (!) 117/91 Pulse Rate: 93 COWS:    Allergies:  Allergies  Allergen Reactions  . Codeine Nausea And Vomiting    Home Medications:  (Not in a hospital admission)  OB/GYN Status:  No LMP for male patient.  General Assessment Data [facility] T[facility] Assessment: In system Is this a Tele or Face-to-Face Assessment?: Face-to-Face Is this an Initial Assessment or a Re-assessment for this encounter?: Initial  Assessment Marital status: Divorced Living Arrangements: Other (Comment)(Homeless.) Can pt return to current living arrangement?: Yes Admission Status: Voluntary Is patient capable of signing voluntary admission?: Yes Referral Source: Self/Family/Friend Insurance type: Self-pay.      Crisis Care Plan Living Arrangements: Other (Comment)(Homeless.) Legal Guardian: Other:(Self. ) Name of Psychiatrist: NA Name of Therapist: NA  Education Status Is patient currently in school?: No Is the patient employed, unemployed or receiving disability?: Unemployed, Receiving disability income(Pt reported, waiting on disabilty check. )  Risk to self with the past 6 months Suicidal Ideation: Yes-Currently Present Has patient been a risk to self within the past 6 months prior to admission? : Yes Suicidal Intent: Yes-Currently Present Has patient had any suicidal intent within the past 6 months prior to admission? : Yes Is patient at risk for suicide?: Yes Suicidal Plan?: Yes-Currently Present Has patient had any suicidal plan within the past 6 months prior to admission? : Yes Specify Current Suicidal Plan: Pt reported, "whatever it takes."  Access to Means: No(Pt denies. ) What has been your use of drugs/alcohol within the last 12 months?: Pt denies. UDS is pending.  Previous Attempts/Gestures: No How many times?: (Pt reported, 2-3 times. ) Other Self Harm Risks: Pt denies.  Triggers for Past Attempts: Unpredictable Intentional Self Injurious Behavior: None(Pt denies. ) Family Suicide History: No Recent stressful life event(s): Loss (Comment), Financial Problems(Homelessness, waiting on disability check. ) Persecutory voices/beliefs?: Yes Depression: Yes Depression Symptoms: Feeling angry/irritable, Feeling worthless/self pity, Loss of interest in usual pleasures, Guilt, Isolating, Fatigue, Insomnia, Despondent Substance abuse history and/or treatment for substance abuse?: No Suicide  prevention information given to non-admitted patients: Not applicable  Risk to Others within the past 6 months Homicidal Ideation: No(Pt denies.) Does patient have any lifetime risk of violence toward others beyond the six months prior to admission? : No Thoughts of Harm to Others: No Current Homicidal Intent: No Current Homicidal Plan: No Access to Homicidal Means: No Identified Victim: NA History of harm to others?: No Assessment of Violence: None Noted Violent Behavior Description: NA Does patient have access to weapons?: No(Pt denies. ) Criminal Charges Pending?: No Does patient have a court date: No Is patient on probation?: No  Psychosis Hallucinations: None noted Delusions: None noted  Mental Status Report Appearance/Hygiene: Unremarkable Eye Contact: Poor(Pt had his head lower. ) Motor Activity: Unremarkable Speech: Logical/coherent Level of Consciousness: Quiet/awake Mood: Depressed, Helpless Affect: Other (Comment)(congruent with mood.) Anxiety Level: Minimal Thought Processes: Coherent, Relevant Judgement: Partial Orientation: Person, Place, Time, Situation Obsessive Compulsive Thoughts/Behaviors: None  Cognitive Functioning Concentration: Normal Memory: Recent Intact Is patient IDD: No Is patient DD?: No Insight: Fair Impulse Control: Fair Appetite: Fair Sleep: Decreased Total Hours of Sleep: (Pt reported, 4-5 hours.) Vegetative Symptoms: Staying in bed  ADLScreening [facility] Assessment Services) Patient's cognitive ability adequate to safely complete daily activities?: Yes Patient able to express need for assistance with ADLs?: Yes Independently performs ADLs?: Yes (appropriate for developmental age)  Prior Inpatient Therapy Prior Inpatient Therapy: No  Prior Outpatient Therapy Prior Outpatient Therapy: No Does patient have an ACCT team?: No Does patient have Intensive In-House Services?  : No Does patient have [facility] services? : No Does patient  have P4CC services?: No  ADL Screening (condition at time of admission) Patient's cognitive ability adequate to safely complete daily activities?: Yes Is the patient deaf or have difficulty hearing?: No Does the patient have difficulty seeing, even when wearing glasses/contacts?: Yes(Pt wears glasses.) Does the patient have difficulty concentrating, remembering, or making decisions?: Yes Patient able to express need for assistance with ADLs?: Yes Does the patient have difficulty dressing or bathing?: No Independently performs ADLs?: Yes (appropriate for developmental age) Does the patient have difficulty walking or climbing stairs?: Yes Weakness of Legs: None Weakness of Arms/Hands: None  Home Assistive Devices/Equipment Home Assistive Devices/Equipment: Oxygen    Abuse/Neglect Assessment (Assessment to be complete while patient is alone) Abuse/Neglect Assessment Can Be Completed: Yes Physical Abuse: Denies(Pt denies. ) Verbal Abuse: Denies(Pt denies. ) Sexual Abuse: Denies(Pt denies. ) Exploitation of patient/patient's resources: Denies(Pt denies. ) Self-Neglect: Denies(Pt denies. )     Advance Directives (For Healthcare) Does Patient Have a Medical Advance Directive?: No Would patient like information on creating a medical advance directive?: No - Patient declined    Additional Information 1:1 In Past 12 Months?: No CIRT Risk: No Elopement Risk: No Does patient have medical clearance?: Yes     Disposition: Lindon Romp, NP recommends overnight observation for safety and stabilization. Disposition discussed with Dr. Florina Ou.   Disposition Initial Assessment Completed for this Encounter: Yes Patient refused recommended treatment: No Mode of transportation if patient is discharged?: N/A Patient referred to: Other (Comment)(overnight observation for safety and stabilization.)  On Site Evaluation by:  Alyson Ingles. Aailyah Dunbar, MS, LPC, CRC. Reviewed with Physician:  Dr. Florina Ou  and Lindon Romp, NP.  Vertell Novak 03/28/2017 2:56 AM   Vertell Novak, MS, [facility], [facility] Triage Specialist [phone]

## 2017-03-28 NOTE — Consult Note (Signed)
[facility] Psychiatry Consult   Reason for Consult:  SI Referring Physician:  EDP Patient Identification: Taylor [name] MRN:  [id number] Principal Diagnosis: Adjustment disorder with mixed disturbance of emotions and conduct Diagnosis:   Patient Active Problem List   Diagnosis Date Noted  . Chronic respiratory failure with hypoxia ([place]) [J96.11] 03/24/2017  . Normocytic normochromic anemia [D64.9] 03/24/2017  . Major depressive disorder, recurrent episode, mild ([place]) [F33.0] 12/05/2016  . COPD with acute exacerbation ([place]) [J44.1] 10/22/2016  . Acute bronchitis due to human metapneumovirus [J20.8] 01/16/2013  . Alcohol abuse [F10.10] 01/09/2013  . COPD exacerbation ([place]) [J44.1] 01/09/2013  . Chest pain [R07.9] 01/09/2013  . Acute respiratory failure with hypoxia ([place]) [J96.01] 01/09/2013  . Smoker [F17.200] 12/16/2012  . Inguinal hernia unilateral, non-recurrent, right [K40.90] 05/01/2012  . Dyspepsia [R10.13] 02/05/2012  . Hyperglycemia [R73.9] 02/04/2012  . COPD  GOLD III [J44.9] 02/03/2012  . Thrombocytopenia ([place]) [D69.6] 02/03/2012  . Depression [F32.9]   . 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 male patient admitted with SI.  HPI:   [name] reports worsening depression and anxiety since discharge from the hospital due to homelessness and "everything else that is going on." He denies HI or AVH. He reports going to the [facility] following discharge yesterday. He was admitted for a COPD exacerbation. He had not filled his psychiatric medications. He was seen by the psychiatry consult service on 3/6. Lexapro was continued and Abilify was restarted. He reported situational SI due to homelessness. He denies a history of suicide attempts or any intention to harm self. Per telepsych note, he endorsed a history of suicide attempts and other conflicting information.   Past  Psychiatric History: MDD and BPAD type 1. He reports onset of depression as a teenager.   Risk to Self: Endorses situational SI due to homelessness.  Risk to Others: Homicidal Ideation: No(Pt denies.) Thoughts of Harm to Others: No Current Homicidal Intent: No Current Homicidal Plan: No Access to Homicidal Means: No Identified Victim: NA History of harm to others?: No Assessment of Violence: None Noted Violent Behavior Description: NA Does patient have access to weapons?: No(Pt denies. ) Criminal Charges Pending?: No Does patient have a court date: No Prior Inpatient Therapy: Prior Inpatient Therapy: No Prior Outpatient Therapy: Prior Outpatient Therapy: No Does patient have an ACCT team?: No Does patient have Intensive In-House Services?  : No Does patient have [facility] services? : No Does patient have P4CC services?: No  Past Medical History:  Past Medical History:  Diagnosis Date  . Bipolar 1 disorder ([place]) 02/05/2012  . 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 SHERRILL  . COPD (chronic obstructive pulmonary disease) ([place])    SMOKER  . Depression    Bipolar disorder  . Full dentures   . Hemorrhoids   . Inguinal hernia    RIGHT- PAINFUL  . Prostate cancer ([place])    Gleason score = 7, supposed to have radiation therapy but he has not followed up  . Rib fractures    hx of  . Shortness of breath     Past Surgical History:  Procedure Laterality Date  . COLON SURGERY  04/11/11   Sigmoid colectomy  . COLOSTOMY REVISION  04/11/2011   Procedure: COLON RESECTION SIGMOID;  Surgeon: [name], MD;  [facility];  Service: General;  Laterality: N/A;  low  anterior colon resection   . INGUINAL HERNIA REPAIR Right 05/01/2012   Procedure: HERNIA REPAIR INGUINAL ADULT;  Surgeon: [name], MD;  [facility];  Service: General;  Laterality: Right;  . INSERTION OF MESH Right 05/01/2012   Procedure: INSERTION OF MESH;  Surgeon: [name], MD;  [facility];  Service: General;  Laterality: Right;  . PORT-A-CATH REMOVAL Left 05/01/2012   Procedure: REMOVAL Infusion Port;  Surgeon: [name], MD;  Location[facility];  Service: General;  Laterality: Left;  . PORTACATH PLACEMENT    . PORTACATH PLACEMENT  05/02/2011   Procedure: INSERTION PORT-A-CATH;  Surgeon: [name], MD;  [facility];  Service: General;  Laterality: N/A;   Family History:  Family History  Problem Relation Age of Onset  . Heart disease Father   . Lung cancer Maternal Uncle        smoked   Family Psychiatric  History: Brother-depression Social History:  Social History   Substance and Sexual Activity  Alcohol Use No   Comment: h/o use in the past, no h/o heavy use     Social History   Substance and Sexual Activity  Drug Use No    Social History   Socioeconomic History  . Marital status: Divorced    Spouse name: None  . Number of children: None  . Years of education: None  . Highest education level: None  Social Needs  . Financial resource strain: None  . Food insecurity - worry: None  . Food insecurity - inability: None  . Transportation needs - medical: None  . Transportation needs - non-medical: None  Occupational History  . Occupation: disability pending  Tobacco Use  . Smoking status: Current Every Day Smoker    Packs/day: 0.50    Years: 45.00    Pack years: 22.50    Types: Cigarettes    Last attempt to quit: 02/11/2014    Years since quitting: 3.1  . Smokeless tobacco: Former Network engineer and Sexual Activity  . Alcohol use: No    Comment: h/o use in the past, no h/o heavy use  . Drug use: No  . Sexual activity: No  Other Topics Concern  . None  Social History Narrative   Lives alone-divorced, disabled, in [place] 1970's   Brother, Lantz Hermann and his wife Jackelyn Poling assist   Prior occupation: Public relations account executive Social History: He is homeless. He denies stable housing for 4 years since release  from jail. He was incarcerated for 2.5 years. He is a sex offender. He is divorced. He has a 70 y/o son for which he is estranged. He is unemployed for the past 10 years. He used to do Dealer work. He denies alcohol or illicit substance use. He reports heavy alcohol use 4 years ago but denies a history of withdrawal. He previously smoked 1 ppd for several years.     Allergies:   Allergies  Allergen Reactions  . Codeine Nausea And Vomiting    Labs:  Results for orders placed or performed during the hospital encounter of 03/28/17 (from the past 48 hour(s))  Ethanol     Status: None   Collection Time: 03/28/17  1:43 AM  Result Value Ref Range   Alcohol, Ethyl (B) <10 <10 mg/dL    Comment:        LOWEST DETECTABLE LIMIT FOR SERUM ALCOHOL IS 10 mg/dL FOR MEDICAL PURPOSES ONLY Performed at [address] [place], [place] [address]   Rapid urine drug screen (hospital performed)     Status: None   Collection Time: 03/28/17  2:48 AM  Result Value Ref Range   Opiates NONE DETECTED NONE DETECTED   Cocaine NONE DETECTED NONE DETECTED   Benzodiazepines NONE DETECTED NONE DETECTED   Amphetamines NONE DETECTED NONE DETECTED   Tetrahydrocannabinol NONE DETECTED NONE DETECTED   Barbiturates NONE DETECTED NONE DETECTED    Comment: (NOTE) DRUG SCREEN FOR MEDICAL PURPOSES ONLY.  IF CONFIRMATION IS NEEDED FOR ANY PURPOSE, NOTIFY LAB WITHIN 5 DAYS. LOWEST DETECTABLE LIMITS FOR URINE DRUG SCREEN Drug Class                     Cutoff (ng/mL) Amphetamine and metabolites    1000 Barbiturate and metabolites    200 Benzodiazepine                 295 Tricyclics and metabolites     300 Opiates and metabolites        300 Cocaine and metabolites        300 THC                            50 Performed at [facility], [place] [address], [place], [place] [address]     Current Facility-Administered Medications  Medication Dose Route Frequency  Provider Last Rate Last Dose  . alum & mag hydroxide-simeth (MAALOX/MYLANTA) 200-200-20 MG/5ML suspension 30 mL  30 mL Oral Q6H PRN Molpus, John, MD      . nicotine (NICODERM CQ - dosed in mg/24 hours) patch 21 mg  21 mg Transdermal Daily Molpus, John, MD       Current Outpatient Medications  Medication Sig Dispense Refill  . albuterol (PROVENTIL HFA;VENTOLIN HFA) 108 (90 Base) MCG/ACT inhaler Inhale 1-2 puffs into the lungs every 6 (six) hours as needed for wheezing or shortness of breath. 1 Inhaler 0  . albuterol (PROVENTIL) (2.5 MG/3ML) 0.083% nebulizer solution Take 6 mLs (5 mg total) by nebulization every 6 (six) hours as needed for wheezing or shortness of breath. 75 mL 1  . ARIPiprazole (ABILIFY) 5 MG tablet Take 1 tablet (5 mg total) by mouth every morning. 30 tablet 0  . azithromycin (ZITHROMAX) 500 MG tablet As written 7 tablet 0  . escitalopram (LEXAPRO) 20 MG tablet Take 1 tablet (20 mg total) by mouth every morning. 30 tablet 0  . Fluticasone-Umeclidin-Vilant (TRELEGY ELLIPTA) 100-62.5-25 MCG/INH AEPB Inhale 1 puff into the lungs daily.    Marland Kitchen lovastatin (MEVACOR) 20 MG tablet Take 1 tablet (20 mg total) by mouth at bedtime. 30 tablet 0  . mometasone-formoterol (DULERA) 200-5 MCG/ACT AERO INHALE 2 PUFFS INTO THE LUNGS FIRST THING IN THE MORNING THEN ANOTHER 2 PUFFS 12 HOURS LATER. 13 g 0  . predniSONE (DELTASONE) 20 MG tablet 2 tabs po daily x 3 days 6 tablet 0  . SPIRIVA RESPIMAT 2.5 MCG/ACT AERS Inhale 2 puffs into the lungs daily. 1 Inhaler 0    Musculoskeletal: Strength & Muscle Tone: decreased due to physical deconditioning.  Gait & Station: normal Patient leans: N/A  Psychiatric Specialty Exam: Physical Exam  Nursing note and vitals reviewed. Constitutional: He is oriented to person, place, and time. He appears well-developed and well-nourished.  HENT:  Head: Normocephalic and atraumatic.  Neck: Normal range of motion.  Respiratory: Effort normal.  Musculoskeletal:  Normal range of motion.  Neurological: He is  alert and oriented to person, place, and time.  Skin: No rash noted.  Psychiatric: His speech is normal and behavior is normal. Judgment and thought content normal. Cognition and memory are normal. He exhibits a depressed mood.    Review of Systems  Psychiatric/Behavioral: Positive for depression and suicidal ideas (situational due to homelessness). Negative for hallucinations and substance abuse. The patient is nervous/anxious.   All other systems reviewed and are negative.   Blood pressure 133/86, pulse 77, temperature 97.9 F (36.6 C), temperature source Oral, resp. rate 15, SpO2 96 %.There is no height or weight on file to calculate BMI.  General Appearance: Fairly Groomed, middle aged, Caucasian male, wearing paper hospital scrubs and lying in bed. NAD.   Eye Contact:  Good  Speech:  Clear and Coherent and Normal Rate  Volume:  Normal  Mood:  Anxious and Depressed  Affect:  Constricted  Thought Process:  Goal Directed, Linear and Descriptions of Associations: Intact  Orientation:  Full (Time, Place, and Person)  Thought Content:  Logical  Suicidal Thoughts:  Yes.  without intent/plan  Homicidal Thoughts:  No  Memory:  Immediate;   Good Recent;   Good Remote;   Good  Judgement:  Fair  Insight:  Fair  Psychomotor Activity:  Normal  Concentration:  Concentration: Good and Attention Span: Good  Recall:  Good  Fund of Knowledge:  Good  Language:  Good  Akathisia:  No  Handed:  Right  AIMS (if indicated):   N/A  Assets:  Communication Skills Desire for Improvement  ADL's:  Intact  Cognition:  WNL  Sleep:   N/A   Assessment:  Taylor [name] is a 62 y.o. male who was admitted for SI following discharge for a COPD exacerbation yesterday. He was seen by psychiatry at recent hospitalization and was continued on Lexapro and restarted on Abilify. He reported conditional SI due to homelessness. He was able to safety plan until the day  of discharge and he subsequently presented to the ED. Patient's SI appears to be conditional and is a means of manipulation to gain housing. His/her behavior is more consistent with someone who is acting in self-preservation, rather than someone who is acutely suicidal. There is no evidence of a psychiatric condition which is amendable to inpatient psychiatric hospitalization. SW was consulted to assist patient with housing resources.    Treatment Plan Summary: -Restart Lexapro 20 mg daily for mood and Abilify 5 mg daily for mood augmentation. -Patient is psychiatrically cleared and SW has been consulted to assist with housing resources.   Disposition: No evidence of imminent risk to self or others at present.   Patient does not meet criteria for psychiatric inpatient admission.  Faythe Dingwall, DO 03/28/2017 9:54 AM

## 2017-03-28 NOTE — Discharge Instructions (Signed)
For support for your psychosocial needs, you are advised to follow up with the [place]:       [facility]      [place], [place] [address]      [phone]

## 2017-03-28 NOTE — BHH Counselor (Signed)
Clinician spoke to [place] to discuss pt's disposition (overnight observation for safety and stabilization) [place] to discuss disposition with Lattie Haw, Therapist, sports.   Vertell Novak, MS, [facility], [facility] Triage Specialist [phone]

## 2017-03-28 NOTE — Progress Notes (Signed)
F/u on oxygen issue-TC [name] NP @ [facility] this evening-she stated that patient yesterday arrived with an oxygen tank, they provided him with resources,then he left w/his oxygen.

## 2017-03-28 NOTE — BH Assessment (Addendum)
[facility] Assessment Progress Note  Per Buford Dresser, DO, this pt does not require psychiatric hospitalization at this time.  Pt is to be discharged from [facility] with recommendation to follow up with the [facility].  This has been included in pt's discharge instructions.  Pt's nurse has been notified.  Jalene Mullet, [place] Triage Specialist [phone]

## 2017-03-28 NOTE — ED Notes (Signed)
Bed: WA27 Expected date:  Expected time:  Means of arrival:  Comments: 

## 2017-03-28 NOTE — Progress Notes (Signed)
Dictation #1 [id number]  [id number] [place] NP @ [facility]-patient did arrive yesterday to [facility] w/an oxygen tank-they provided resources & then left.

## 2017-03-28 NOTE — Progress Notes (Signed)
CSW aware of consult. CSW spoke with Gershon Mussel Geneticist, molecular at [facility] ED) and was informed that pt has a few barriers that would interfere with pt potentially going to a homeless shelter. CSW was informed by Gershon Mussel that Gershon Mussel would redirect pt back to the [facility] in [place] so that they can assist with shelter as they are familiar with pt and situation now and from the past. Tom to staff this care with psychiatry and will follow up with CSW as needed. CSW remains available for support at this time.    Virgie Dad Zita Ozimek, MSW, [place] [facility] Department Clinical Social Worker [phone]

## 2017-03-28 NOTE — ED Notes (Signed)
Rn attempted to go over discharge paperwork with pt. Pt became upset and stated "I'm just going to get my shit and leave this state. I'm tired of yall and law enforcement and everything." RN asked pt how he was getting home and he states he will call someone, but his oxygen is at home.  PTAR being called for transport

## 2017-03-28 NOTE — Progress Notes (Addendum)
CSW received a call from CM updating the CSW about pt's needs.  CSW contacted [name] and lobby are currently full.  CSW spoke to [employer] of [facility] Transition to [place] who stated she has a room that is available.  Miss Rosana Hoes stated client would have to share with another resident for about a week and before having a private room.  The pt, if in agreement to sign over his check, would have to interview with Mr. Carlota Raspberry, the house manager for the [facility] run by [employer].  Miss Rosana Hoes was advised the pt is a registered sex offender.  [facility] ED CM stated CM has contacted the [place], but has not had a return call regarding attaining another oxygen tank.  CSW contacted non-emergency dispatch and requested a Sheriff's Deputy conduct a wellness check to the residence of the pt's friend Carrolyn Meiers at ph: [phone].  CSW has called and not gotten an answer.  CSW left a HIPPA-compliant VM requesting a return call.  Sheriff's Deputy will be en route to pt's friend's house at:  [address], [place], [place] [address].  This is also the pt's address on pt's facesheet and per notes from [facility] on 02/22/17, pt's friend Mr. Eulas Post allows the pt to list this address as his personal address in order for the pt to receive important mail, but that the pt does not officially live there.  Per [facility] ED CM, pt states he IS residing there now on a temporary basis and that some of his possessions, including the pt's oxygen tank are at this address now.  CSW will continue to follow for D/C needs.  Alphonse Guild. Shabrea Weldin, LCSW, LCAS, CSI Clinical Social Worker Ph: [phone]

## 2017-03-28 NOTE — Progress Notes (Addendum)
2:08pm-CSW has tried numbers in the chart in attempts to reach pt's sister to inform her that pt is returning home today. CSW left VM at number asking that pt's sister call CSW back to verify address for pt. CSW will hand off to evening CSW as needed in continuing to help contact family for pt.   1:43pm- CSW spoke with [facility] ED Secretary and was informed that Corey Harold is refusing to take pt to address listed in the chart. CSW spoke with Levada Dy [facility]) at [facility] ED and was informed that this is pt's address and that all oxygen has already been delivered to pt's home (address listed in pt's chart). CSW was informed that Erline Levine would call CSW back if any further transportation needs are needed.   CSW informed by Gershon Mussel that CSW services are no longer needed for pt at this time. CSW aware that pt is being discharged at this time. CSW signing off as there are no further CSW interventions needs.     Virgie Dad Karliah Kowalchuk, MSW, [place] Emergency Department Clinical Social Worker [phone]

## 2017-03-28 NOTE — ED Notes (Addendum)
Pt on 2 L/Lea.  Pt stated "I was started on it 1 month ago.  I got a little inheritance but that got gone on rent.  I was in prison for 2.5 years.  I'm on the registry for sex offenders.  I left after I was d/c'd earlier & my friend came to pick me up to go to the [facility].  I filled out paperwork for medicines.  I've got a lawyer working to get me off the registry now.  That's killing me.  I was renting a room at a house in [place], about 4 years ago, was drinking some beer.  I was taking steroids, having mood swings.  I went back in and they had cooked pork chops and everything...didn't offer me anything.  All the people in that boarding house were sorry.  I walked down there and the next thing, the law showed up.""

## 2017-03-28 NOTE — ED Notes (Addendum)
Per PTAR: Pt cannot take pt to the address listed because no one is answering at the address and PTAR cannot transport without having confirmation that someone is there to let them in.   Charge nurse and RN and Levada Dy (Nurse Case Freight forwarder) have all attempted to call numbers listed and attempt to help properly discharge the pt.

## 2017-03-28 NOTE — ED Provider Notes (Addendum)
This is a 62 year old male with a history of COPD who was seen for COPD exacerbation by Dr. Roderic Palau.  He was treated with improvement and put up for discharge.  At time of discharge he stated he was suicidal and did not want to leave.  He wishes to be evaluated for suicidal ideation which she states is been present for 2 or 3 days.  He attributes this to "everything going wrong in my life", including his poor health.  He does not have a plan.  He never left the ED but was discharged out of epic and so this appears as a new visit though it is a continuation of his previous visit.  For full visit documentation see Dr. Ellsworth Lennox note.  Physical examination: General: Well-developed, well-nourished male in no acute distress; appears older than age of record Eyes: PERRLA; EOMI Lungs: Clear to auscultation, distant sounds; on oxygen by nasal cannula Heart: Regular rate and rhythm Abdomen: Soft, nondistended Neurologic: Awake and alert; noted to move all extremities Psychiatric: SI; depressed mood with congruent affect  Nursing notes and vitals signs, including pulse oximetry, reviewed.  Summary of this visit's results, reviewed by myself:  EKG:  EKG Interpretation  Date/Time:    Ventricular Rate:    PR Interval:    QRS Duration:   QT Interval:    QTC Calculation:   R Axis:     Text Interpretation:         Labs:  Results for orders placed or performed during the hospital encounter of 03/28/17 (from the past 24 hour(s))  Ethanol     Status: None   Collection Time: 03/28/17  1:43 AM  Result Value Ref Range   Alcohol, Ethyl (B) <10 <10 mg/dL  Rapid urine drug screen (hospital performed)     Status: None   Collection Time: 03/28/17  2:48 AM  Result Value Ref Range   Opiates NONE DETECTED NONE DETECTED   Cocaine NONE DETECTED NONE DETECTED   Benzodiazepines NONE DETECTED NONE DETECTED   Amphetamines NONE DETECTED NONE DETECTED   Tetrahydrocannabinol NONE DETECTED NONE DETECTED   Barbiturates NONE DETECTED NONE DETECTED    Imaging Studies: Dg Chest Port 1 View  Result Date: 03/27/2017 CLINICAL DATA:  Shortness of breath EXAM: PORTABLE CHEST 1 VIEW COMPARISON:  03/23/2017 03/21/2017, 02/22/2017 FINDINGS: Hyperinflation with emphysematous disease. No focal pulmonary infiltrate or effusion. Normal cardiomediastinal silhouette. No pneumothorax. IMPRESSION: No active disease.  Hyperinflation with emphysematous disease Electronically Signed   By: Donavan Foil M.D.   On: 03/27/2017 19:37      Shayanna Thatch, Jenny Reichmann, MD 03/28/17 0250    Shanon Rosser, MD 03/28/17 [phone]

## 2017-03-28 NOTE — Care Management Note (Signed)
Case Management Note  Patient Details  Name: ROMELLE REILEY MRN: [id number] Date of Birth: [birth year]  CM contacted by [facility] ED CSW to speak with PTAR who was refusing to transport pt to provided address.  PTAR was reporting they would not transport pt to a home where he was not going to be able to stay; this address is a friends house.  This address is also where pt's O2 tank is located in his friends truck.  CM and PTAR with pt came to an agreement they would transport to [address], [place] [address] but they had to confirm someone was at this location.  Pt has not been able to get in touch with friend or friends wife today.  Primary RN Fara Chute asked for phone numbers to attempt to call herself.  CM called multiple phone numbers at the [facility] for SW for guidance with pt.  No return calls at this time.  CM contacted CM/CSW Director to update on pt and request suggestion on other ideas of how to proceed.  CM spoke with pt who was able to provide a phone number for [place], MSW at [facility] when he first received the O2 through the [place], [phone].  [facility] referred CM to [facility], [phone] who advised Caryl Pina at the [facility] clinic would be able to assist.  [facility] at [phone] ext [phone] and left VM for Caryl Pina to return CM call.  CM will request [facility] ED CM on arrival to speak with pt about housing arrangements since shelters will not accept O2 tanks and pt is additionally a registered sex offender which CM confirmed on [place] registry.  Pt reports he received a social security check, he is pending disability and VA benefits with his first appointment on March 12th at the [place].  Expected Discharge Date:   03/28/2017               Expected Discharge Plan:  Home/Self Care  In-House Referral:  Clinical Social Work  Discharge planning Services  CM Consult  Post Acute Care Choice:  Durable Medical Equipment Choice offered to:  Patient  DME Arranged:   Oxygen DME Agency:   [facility]  Status of Service: In process  Jevante Hollibaugh, Benjaman Lobe, RN 03/28/2017, 2:32 PM

## 2017-03-28 NOTE — ED Notes (Addendum)
Pt reports that he want his "mental health medications fixed." He reports that he is currently not on anything. He reports that he has thought about hurting himself. Denies a plan or intent. EDP [place] made aware. He has been seen for same in the past. Instructed that patient can check back in and should be discharged at this time. Pt taken to lobby with a portable O2 tank. Patient was agreeable to this. He states that he will check in if he feels that he needs to. Charge Nurse also made aware.

## 2017-03-28 NOTE — ED Triage Notes (Signed)
Pt was seen here earlier today and was discharged  Pt has been sitting in the lobby since he was discharged  Pt states he is having shortness of breath due to his COPD and has mental problems and is trying to get back on his medication  Pt states he is having suicidal ideations for the past few days  Pt states he has headaches and hot flashes

## 2017-03-28 NOTE — BHH Suicide Risk Assessment (Addendum)
Suicide Risk Assessment  Discharge Assessment   [facility] Discharge Suicide Risk Assessment   Principal Problem: Adjustment disorder with depressed mood Discharge Diagnoses:  Patient Active Problem List   Diagnosis Date Noted  . Adjustment disorder with depressed mood [F43.21] 03/28/2017    Priority: High  . Adjustment disorder with mixed disturbance of emotions and conduct [F43.25]   . Chronic respiratory failure with hypoxia ([place]) [J96.11] 03/24/2017  . Normocytic normochromic anemia [D64.9] 03/24/2017  . COPD with acute exacerbation ([place]) [J44.1] 10/22/2016  . Acute bronchitis due to human metapneumovirus [J20.8] 01/16/2013  . Alcohol abuse [F10.10] 01/09/2013  . COPD exacerbation ([place]) [J44.1] 01/09/2013  . Chest pain [R07.9] 01/09/2013  . Acute respiratory failure with hypoxia ([place]) [J96.01] 01/09/2013  . Smoker [F17.200] 12/16/2012  . Inguinal hernia unilateral, non-recurrent, right [K40.90] 05/01/2012  . Dyspepsia [R10.13] 02/05/2012  . Hyperglycemia [R73.9] 02/04/2012  . COPD  GOLD III [J44.9] 02/03/2012  . Thrombocytopenia ([place]) [D69.6] 02/03/2012  . Depression [F32.9]   . Colon cancer, sigmoid [C18.9] 04/08/2011    Total Time spent with patient: 45 minutes  Musculoskeletal: Strength & Muscle Tone: decreased due to physical deconditioning.  Gait & Station: normal Patient leans: N/A  Psychiatric Specialty Exam: Physical Exam  Nursing note and vitals reviewed. Constitutional: He is oriented to person, place, and time. He appears well-developed and well-nourished.  HENT:  Head: Normocephalic and atraumatic.  Neck: Normal range of motion.  Respiratory: Effort normal.  Musculoskeletal: Normal range of motion.  Neurological: He is alert and oriented to person, place, and time.  Skin: No rash noted.  Psychiatric: His speech is normal and behavior is normal. Judgment and thought content normal. Cognition and memory are normal. He exhibits a depressed mood.    Review of  Systems  Psychiatric/Behavioral: Positive for depression and suicidal ideas (situational due to homelessness). Negative for hallucinations and substance abuse. The patient is nervous/anxious.   All other systems reviewed and are negative.   Blood pressure 133/86, pulse 77, temperature 97.9 F (36.6 C), temperature source Oral, resp. rate 15, SpO2 96 %.There is no height or weight on file to calculate BMI.  General Appearance: Fairly Groomed, middle aged, Caucasian male, wearing paper hospital scrubs and lying in bed. NAD.   Eye Contact:  Good  Speech:  Clear and Coherent and Normal Rate  Volume:  Normal  Mood:  Anxious and Depressed  Affect:  Constricted  Thought Process:  Goal Directed, Linear and Descriptions of Associations: Intact  Orientation:  Full (Time, Place, and Person)  Thought Content:  Logical  Suicidal Thoughts:  Yes.  without intent/plan  Homicidal Thoughts:  No  Memory:  Immediate;   Good Recent;   Good Remote;   Good  Judgement:  Fair  Insight:  Fair  Psychomotor Activity:  Normal  Concentration:  Concentration: Good and Attention Span: Good  Recall:  Good  Fund of Knowledge:  Good  Language:  Good  Akathisia:  No  Handed:  Right  AIMS (if indicated):   N/A  Assets:  Communication Skills Desire for Improvement  ADL's:  Intact  Cognition:  WNL  Sleep:   N/A   Mental Status Per Nursing Assessment::   On Admission:   62 yo male who came to the ED for COPD issues but at discharge he said he was suicidal.  The psychiatrist met with him and today and he denied suicidal/homicidal ideations, hallucinations, or substance abuse.  Stable for discharge.  Demographic Factors:  Male  Loss Factors: NA  Historical Factors: NA  Risk Reduction Factors:   Sense of responsibility to family  Continued Clinical Symptoms:  Depression, mild  Cognitive Features That Contribute To Risk:  None    Suicide Risk:  Minimal: No identifiable suicidal ideation.  Patients  presenting with no risk factors but with morbid ruminations; may be classified as minimal risk based on the severity of the depressive symptoms    Plan Of Care/Follow-up recommendations:  Activity:  as tolerated Diet:  heart healthy diet  Jimi Giza, NP 03/28/2017, 12:05 PM

## 2017-03-28 NOTE — Progress Notes (Addendum)
CSW spoke to pt's friend Carrolyn Meiers at ph: [phone] who lives at:    [address] [place], [place] [address].  Mr. Eulas Post stated hen is en route to [facility] ED with pt's "pull behind" portable oxygen tank and "oxygen machine" (as well as the pt's clothing) that the pt will need to live, with his oxygen needs, for the time being or indefinitely.  Per the pt, the pt has three smaller empty oxygen tanks and one larger air tank at Mr. [place] home.  Per the pt, Mr. Eulas Post will arrange with the pt to transport these tanks at a later date, once the pt is D/C'd from the hospital.  CSW received a phone call from Marya Amsler, the men's house manager at the [place] facility who is interviewing the pt now via the phone.  7:12 PM Pt was accepted by the [place] [facility].  Pt's friend Carrolyn Meiers at ph: [phone] agreed to transport pt after arriving with pt's oxygen equipment. Pt D/C'd with Mr. Eulas Post to transport to [facility].  CSW informed Mr. Marya Amsler of the Men's home by HIPPA-compliant text that pt was en route to the home.  CSW provided pt's friend Carrolyn Meiers with written driving instructions and contact information for [facility].  7:32 PM CSW received a message from Mr. Marya Amsler, pt has arrived at [facility].  Please reconsult if future social work needs arise.  CSW signing off, as social work intervention is no longer needed.  Alphonse Guild. Felissa Blouch, LCSW, LCAS, CSI Clinical Social Worker Ph: [phone]

## 2017-03-28 NOTE — ED Notes (Signed)
Social work at bedside.  

## 2017-04-05 ENCOUNTER — Inpatient Hospital Stay (HOSPITAL_COMMUNITY)
Admission: EM | Admit: 2017-04-05 | Discharge: 2017-04-09 | DRG: 189 | Disposition: A | Discharge status: Home / Self Care | Payer: Medicaid Other | Attending: Internal Medicine | Admitting: Internal Medicine

## 2017-04-05 ENCOUNTER — Emergency Department (HOSPITAL_COMMUNITY): Payer: Medicaid Other

## 2017-04-05 ENCOUNTER — Other Ambulatory Visit: Payer: Self-pay

## 2017-04-05 ENCOUNTER — Encounter (HOSPITAL_COMMUNITY): Payer: Self-pay | Admitting: *Deleted

## 2017-04-05 DIAGNOSIS — D649 Anemia, unspecified: Secondary | ICD-10-CM | POA: Diagnosis present

## 2017-04-05 DIAGNOSIS — F17210 Nicotine dependence, cigarettes, uncomplicated: Secondary | ICD-10-CM | POA: Diagnosis present

## 2017-04-05 DIAGNOSIS — F17200 Nicotine dependence, unspecified, uncomplicated: Secondary | ICD-10-CM

## 2017-04-05 DIAGNOSIS — D72829 Elevated white blood cell count, unspecified: Secondary | ICD-10-CM

## 2017-04-05 DIAGNOSIS — Z801 Family history of malignant neoplasm of trachea, bronchus and lung: Secondary | ICD-10-CM

## 2017-04-05 DIAGNOSIS — J441 Chronic obstructive pulmonary disease with (acute) exacerbation: Secondary | ICD-10-CM | POA: Diagnosis not present

## 2017-04-05 DIAGNOSIS — Z9221 Personal history of antineoplastic chemotherapy: Secondary | ICD-10-CM

## 2017-04-05 DIAGNOSIS — Z9981 Dependence on supplemental oxygen: Secondary | ICD-10-CM

## 2017-04-05 DIAGNOSIS — F319 Bipolar disorder, unspecified: Secondary | ICD-10-CM | POA: Diagnosis present

## 2017-04-05 DIAGNOSIS — C61 Malignant neoplasm of prostate: Secondary | ICD-10-CM | POA: Diagnosis present

## 2017-04-05 DIAGNOSIS — Z9049 Acquired absence of other specified parts of digestive tract: Secondary | ICD-10-CM

## 2017-04-05 DIAGNOSIS — F329 Major depressive disorder, single episode, unspecified: Secondary | ICD-10-CM | POA: Diagnosis present

## 2017-04-05 DIAGNOSIS — F32A Depression, unspecified: Secondary | ICD-10-CM | POA: Diagnosis present

## 2017-04-05 DIAGNOSIS — F419 Anxiety disorder, unspecified: Secondary | ICD-10-CM | POA: Diagnosis present

## 2017-04-05 DIAGNOSIS — Z8249 Family history of ischemic heart disease and other diseases of the circulatory system: Secondary | ICD-10-CM

## 2017-04-05 DIAGNOSIS — F4321 Adjustment disorder with depressed mood: Secondary | ICD-10-CM | POA: Diagnosis present

## 2017-04-05 DIAGNOSIS — Z85038 Personal history of other malignant neoplasm of large intestine: Secondary | ICD-10-CM

## 2017-04-05 DIAGNOSIS — J9621 Acute and chronic respiratory failure with hypoxia: Secondary | ICD-10-CM

## 2017-04-05 DIAGNOSIS — R739 Hyperglycemia, unspecified: Secondary | ICD-10-CM | POA: Diagnosis present

## 2017-04-05 DIAGNOSIS — J9622 Acute and chronic respiratory failure with hypercapnia: Principal | ICD-10-CM

## 2017-04-05 DIAGNOSIS — Z885 Allergy status to narcotic agent status: Secondary | ICD-10-CM

## 2017-04-05 LAB — I-STAT CHEM 8, ED
BUN: 4 mg/dL — AB (ref 6–20)
Calcium, Ion: 1.08 mmol/L — ABNORMAL LOW (ref 1.15–1.40)
Chloride: 97 mmol/L — ABNORMAL LOW (ref 101–111)
Creatinine, Ser: 0.8 mg/dL (ref 0.61–1.24)
GLUCOSE: 108 mg/dL — AB (ref 65–99)
HCT: 45 % (ref 39.0–52.0)
Hemoglobin: 15.3 g/dL (ref 13.0–17.0)
Potassium: 3.9 mmol/L (ref 3.5–5.1)
SODIUM: 140 mmol/L (ref 135–145)
TCO2: 32 mmol/L (ref 22–32)

## 2017-04-05 LAB — CBC WITH DIFFERENTIAL/PLATELET
Basophils Absolute: 0 10*3/uL (ref 0.0–0.1)
Basophils Relative: 0 %
Eosinophils Absolute: 0.2 10*3/uL (ref 0.0–0.7)
Eosinophils Relative: 2 %
HCT: 45.8 % (ref 39.0–52.0)
Hemoglobin: 14.7 g/dL (ref 13.0–17.0)
LYMPHS PCT: 34 %
Lymphs Abs: 3.9 10*3/uL (ref 0.7–4.0)
MCH: 31.1 pg (ref 26.0–34.0)
MCHC: 32.1 g/dL (ref 30.0–36.0)
MCV: 96.8 fL (ref 78.0–100.0)
MONO ABS: 0.7 10*3/uL (ref 0.1–1.0)
Monocytes Relative: 6 %
NEUTROS ABS: 6.6 10*3/uL (ref 1.7–7.7)
NEUTROS PCT: 58 %
PLATELETS: 258 10*3/uL (ref 150–400)
RBC: 4.73 MIL/uL (ref 4.22–5.81)
RDW: 13.5 % (ref 11.5–15.5)
WBC: 11.4 10*3/uL — ABNORMAL HIGH (ref 4.0–10.5)

## 2017-04-05 LAB — BASIC METABOLIC PANEL
ANION GAP: 11 (ref 5–15)
BUN: <5 mg/dL — ABNORMAL LOW (ref 6–20)
CALCIUM: 8.5 mg/dL — AB (ref 8.9–10.3)
CO2: 29 mmol/L (ref 22–32)
Chloride: 99 mmol/L — ABNORMAL LOW (ref 101–111)
Creatinine, Ser: 0.77 mg/dL (ref 0.61–1.24)
GFR calc Af Amer: >60 mL/min (ref >60)
Glucose, Bld: 113 mg/dL — ABNORMAL HIGH (ref 65–99)
POTASSIUM: 4.1 mmol/L (ref 3.5–5.1)
Sodium: 139 mmol/L (ref 135–145)

## 2017-04-05 LAB — I-STAT ARTERIAL BLOOD GAS, ED
Acid-Base Excess: 4 mmol/L — ABNORMAL HIGH (ref 0.0–2.0)
BICARBONATE: 32.3 mmol/L — AB (ref 20.0–28.0)
O2 Saturation: 99 %
PO2 ART: 171 mmHg — AB (ref 83.0–108.0)
TCO2: 34 mmol/L — AB (ref 22–32)
pCO2 arterial: 60.7 mmHg — ABNORMAL HIGH (ref 32.0–48.0)
pH, Arterial: 7.331 — ABNORMAL LOW (ref 7.350–7.450)

## 2017-04-05 LAB — I-STAT TROPONIN, ED: TROPONIN I, POC: 0 ng/mL (ref 0.00–0.08)

## 2017-04-05 LAB — BRAIN NATRIURETIC PEPTIDE: B NATRIURETIC PEPTIDE 5: 43 pg/mL (ref 0.0–100.0)

## 2017-04-05 MED ORDER — ACETAMINOPHEN 500 MG PO TABS
1000.0000 mg | ORAL_TABLET | Freq: Once | ORAL | Status: AC
  Administered 2017-04-06: 1000 mg via ORAL
  Filled 2017-04-05: qty 2

## 2017-04-05 MED ORDER — ALBUTEROL (5 MG/ML) CONTINUOUS INHALATION SOLN
15.0000 mg/h | INHALATION_SOLUTION | RESPIRATORY_TRACT | Status: AC
  Administered 2017-04-05: 15 mg/h via RESPIRATORY_TRACT
  Filled 2017-04-05: qty 20

## 2017-04-05 MED ORDER — SODIUM CHLORIDE 0.9 % IV BOLUS (SEPSIS)
500.0000 mL | Freq: Once | INTRAVENOUS | Status: AC
  Administered 2017-04-06: 500 mL via INTRAVENOUS

## 2017-04-05 NOTE — ED Triage Notes (Signed)
Pt presents with COPD exacerbation; recent discharge for same but was unable to fill medications due to finances. EMS reports decreased lung sounds on arrival. Has been around other people who have had the flu. Pt is on home oxygen at 2L. EMS gave 10mg  albuterol, 0.5mg  atrovent, 2g Mag, 125mg  solumedrol.

## 2017-04-05 NOTE — H&P (Signed)
History and Physical    Taylor [name] ZOX:[id number] DOB: 1956-01-11 DOA: 04/05/2017  Referring MD/NP/PA: Tobie Poet, MD(Resident) PCP: Patient, No Pcp Per  Patient coming from: via EMS  Chief Complaint: Shortness of breath  I have personally briefly reviewed patient's old medical records in [place]   HPI: Taylor [name] is a 62 y.o. male with medical history significant of COPD, on 2 L of oxygen 24/7, bipolar disorder, tobacco abuse, history of prostate cancer, and colon cancer; who presents with complaints of progressively worsening shortness of breath over the last 2 days.  Patient was just last hospitalized from 3/3-3/7 with acute on chronic respiratory failure with COPD exacerbation discharged home on prescriptions for azithromycin and steroid taper.  Patient reports never being able to obtain medications due to lack of funds.  Patient was subsequently seen in the emergency department on 3/7 with complaints of shortness of breath.  At some point he ended up being discharged, but patient was noted to be suicidal and did not want to be leave as nothing was going right in his life.  Therefore patient had to be readmitted 3/8 but ultimately was discharged.  Since being [facility] the [facility] patient reports coming in contact with 1 of the residents that had the flu.  R generalized eports having headache, body aches, productive cough, wheezing, and worsening shortness of breath. Denies having any fever, nausea, vomiting, diarrhea, or loss of consciousness.  En route with EMS patient had been given 125 mg of IV Solu-Medrol , 2 g of magnesium sulfate IV, and DuoNeb breathing treatment.  ED Course: Upon admission into the emergency department patient was noted to be afebrile, pulse 91-110, respirations 21-26, blood pressures maintained, O2 saturations 97-100% on BiPAP.  Initial ABG revealed pH of 7.331, PCO2 60.7, PO2 171.  Chest x-ray showed hyperinflated lungs with no acute  infiltrate.   Patient was given hour-long breathing treatment.  TRH called to admit.  Review of Systems  Constitutional: Positive for malaise/fatigue. Negative for fever.  HENT: Negative for ear discharge and ear pain.   Eyes: Negative for photophobia and pain.  Respiratory: Positive for cough, sputum production, shortness of breath and wheezing.   Cardiovascular: Negative for chest pain and orthopnea.  Gastrointestinal: Negative for abdominal pain, diarrhea and vomiting.  Genitourinary: Negative for dysuria and hematuria.  Musculoskeletal: Positive for myalgias. Negative for joint pain.  Skin: Negative for itching and rash.  Neurological: Positive for headaches. Negative for loss of consciousness.  Psychiatric/Behavioral: Negative for memory loss, substance abuse and suicidal ideas.    Past Medical History:  Diagnosis Date  . Bipolar 1 disorder ([place]) 02/05/2012  . Cancer ([place]) 04/11/11   adenocarcinoma of colon, 7/19 nodes pos.FINISHED CHEMO/DR. SHERRILL  . COPD (chronic obstructive pulmonary disease) ([place])    SMOKER  . Depression    Bipolar disorder  . Full dentures   . Hemorrhoids   . Inguinal hernia    RIGHT- PAINFUL  . Prostate cancer ([place])    Gleason score = 7, supposed to have radiation therapy but he has not followed up  . Rib fractures    hx of  . Shortness of breath     Past Surgical History:  Procedure Laterality Date  . COLON SURGERY  04/11/11   Sigmoid colectomy  . COLOSTOMY REVISION  04/11/2011   Procedure: COLON RESECTION SIGMOID;  Surgeon: [name], MD;  Location: [facility];  Service: General;  Laterality: N/A;  low anterior colon resection   .  INGUINAL HERNIA REPAIR Right 05/01/2012   Procedure: HERNIA REPAIR INGUINAL ADULT;  Surgeon: [name], MD;  Location: [facility];  Service: General;  Laterality: Right;  . INSERTION OF MESH Right 05/01/2012   Procedure: INSERTION OF MESH;  Surgeon: [name], MD;  [facility]: [facility];  Service: General;  Laterality:  Right;  . PORT-A-CATH REMOVAL Left 05/01/2012   Procedure: REMOVAL Infusion Port;  Surgeon: [name], MD;  Location: [facility];  Service: General;  Laterality: Left;  . PORTACATH PLACEMENT    . PORTACATH PLACEMENT  05/02/2011   Procedure: INSERTION PORT-A-CATH;  Surgeon: [name], MD;  [facility]: [facility];  Service: General;  Laterality: N/A;     reports that he has been smoking cigarettes.  He has a 22.50 pack-year smoking history. He has quit using smokeless tobacco. He reports that he does not drink alcohol or use drugs.  Allergies  Allergen Reactions  . Codeine Nausea And Vomiting    Family History  Problem Relation Age of Onset  . Heart disease Father   . Lung cancer Maternal Uncle        smoked    Prior to Admission medications   Medication Sig Start Date End Date Taking? Authorizing Provider  albuterol (PROVENTIL HFA;VENTOLIN HFA) 108 (90 Base) MCG/ACT inhaler Inhale 1-2 puffs into the lungs every 6 (six) hours as needed for wheezing or shortness of breath. 12/11/16   Palumbo, April, MD  albuterol (PROVENTIL) (2.5 MG/3ML) 0.083% nebulizer solution Take 6 mLs (5 mg total) by nebulization every 6 (six) hours as needed for wheezing or shortness of breath. 03/22/17   Larene Pickett, PA-C  ARIPiprazole (ABILIFY) 5 MG tablet Take 1 tablet (5 mg total) by mouth every morning. 03/27/17   Deatra James, MD  azithromycin (ZITHROMAX) 500 MG tablet As written 03/28/17   Deatra James, MD  escitalopram (LEXAPRO) 20 MG tablet Take 1 tablet (20 mg total) by mouth every morning. 03/27/17   Shahmehdi, Valeria Batman, MD  Fluticasone-Umeclidin-Vilant (TRELEGY ELLIPTA) 100-62.5-25 MCG/INH AEPB Inhale 1 puff into the lungs daily.    [provider]  lovastatin (MEVACOR) 20 MG tablet Take 1 tablet (20 mg total) by mouth at bedtime. 03/27/17   Shahmehdi, Valeria Batman, MD  mometasone-formoterol (DULERA) 200-5 MCG/ACT AERO INHALE 2 PUFFS INTO THE LUNGS FIRST THING IN THE MORNING THEN ANOTHER 2 PUFFS 12  HOURS LATER. 03/27/17   Deatra James, MD  predniSONE (DELTASONE) 20 MG tablet 2 tabs po daily x 3 days 03/27/17   [name], MD  SPIRIVA RESPIMAT 2.5 MCG/ACT AERS Inhale 2 puffs into the lungs daily. 03/22/17   Deno Etienne, DO    Physical Exam:  Constitutional: Older male who appears to be in mild respiratory distress currently. Vitals:   04/05/17 2236 04/05/17 2242 04/05/17 2259  BP:  (!) 177/89 (!) 148/83  Pulse:  94 91  Resp:  (!) 26 (!) 21  Temp:  (!) 97.4 F (36.3 C)   SpO2: 97% 100%    Eyes: PERRL, lids and conjunctivae normal ENMT: Mucous membranes are dry. Posterior pharynx clear of any exudate or lesions. Neck: normal, supple, no masses, no thyromegaly Respiratory: Mildly tachypneic with expiratory wheezes appreciated throughout both lung fields.  Patient able to talk and shortened sentences on BiPAP. Cardiovascular: Regular rate and rhythm, no murmurs / rubs / gallops. No extremity edema. 2+ pedal pulses. No carotid bruits.  Abdomen: no tenderness, no masses palpated. No hepatosplenomegaly. Bowel sounds positive.  Musculoskeletal: no clubbing / cyanosis. No joint deformity upper and lower extremities. Good ROM, no contractures. Normal muscle tone.  Skin: no rashes, lesions, ulcers. No induration Neurologic: CN 2-12 grossly intact. Sensation intact, DTR normal. Strength 5/5 in all 4.  Psychiatric: Normal judgment and insight. Alert and oriented x 3. Normal mood.     Labs on Admission: I have personally reviewed following labs and imaging studies  CBC: Recent Labs  Lab 04/05/17 2241 04/05/17 2249  WBC 11.4*  --   NEUTROABS 6.6  --   HGB 14.7 15.3  HCT 45.8 45.0  MCV 96.8  --   PLT 258  --    Basic Metabolic Panel: Recent Labs  Lab 04/05/17 2241 04/05/17 2249  NA 139 140  K 4.1 3.9  CL 99* 97*  CO2 29  --   GLUCOSE 113* 108*  BUN <5* 4*  CREATININE 0.77 0.80  CALCIUM 8.5*  --    GFR: Estimated Creatinine Clearance: 102 mL/min (by C-G formula based  on SCr of 0.8 mg/dL). Liver Function Tests: No results for input(s): AST, ALT, ALKPHOS, BILITOT, PROT, ALBUMIN in the last 168 hours. No results for input(s): LIPASE, AMYLASE in the last 168 hours. No results for input(s): AMMONIA in the last 168 hours. Coagulation Profile: No results for input(s): INR, PROTIME in the last 168 hours. Cardiac Enzymes: No results for input(s): CKTOTAL, CKMB, CKMBINDEX, TROPONINI in the last 168 hours. BNP (last 3 results) No results for input(s): PROBNP in the last 8760 hours. HbA1C: No results for input(s): HGBA1C in the last 72 hours. CBG: No results for input(s): GLUCAP in the last 168 hours. Lipid Profile: No results for input(s): CHOL, HDL, LDLCALC, TRIG, CHOLHDL, LDLDIRECT in the last 72 hours. Thyroid Function Tests: No results for input(s): TSH, T4TOTAL, FREET4, T3FREE, THYROIDAB in the last 72 hours. Anemia Panel: No results for input(s): VITAMINB12, FOLATE, FERRITIN, TIBC, IRON, RETICCTPCT in the last 72 hours. Urine analysis:    Component Value Date/Time   COLORURINE YELLOW 10/18/2016 1855   APPEARANCEUR HAZY (A) 10/18/2016 1855   LABSPEC 1.028 10/18/2016 1855   PHURINE 5.0 10/18/2016 1855   GLUCOSEU NEGATIVE 10/18/2016 1855   HGBUR NEGATIVE 10/18/2016 1855   BILIRUBINUR NEGATIVE 10/18/2016 1855   KETONESUR NEGATIVE 10/18/2016 1855   PROTEINUR 100 (A) 10/18/2016 1855   UROBILINOGEN 1.0 01/09/2013 1843   NITRITE NEGATIVE 10/18/2016 1855   LEUKOCYTESUR NEGATIVE 10/18/2016 1855   Sepsis Labs: No results found for this or any previous visit (from the past 240 hour(s)).   Radiological Exams on Admission: Dg Chest Port 1 View  Result Date: 04/06/2017 CLINICAL DATA:  Dyspnea.  COPD. EXAM: PORTABLE CHEST 1 VIEW COMPARISON:  03/27/2017 chest radiograph. FINDINGS: Stable cardiomediastinal silhouette with normal heart size. No pneumothorax. No pleural effusion. Hyperinflated lungs and emphysema. No pulmonary edema. No acute consolidative  airspace disease. Stable healed upper right rib deformities. IMPRESSION: 1. Hyperinflated lungs and emphysema, compatible with COPD. 2. No acute cardiopulmonary disease. Electronically Signed   By: Ilona Sorrel M.D.   On: 04/06/2017 00:07    EKG: Independently reviewed.  Sinus rhythm at 99 bpm  Assessment/Plan COPD exacerbation, respiratory failure with hypercapnia and hypoxia: Acute on chronic.  Patient presents with progressively worsening shortness of breath and cough.  He was just last hospitalized on 3/3-3/7 for the same.  Initial Abg pH 7.331,PCO2 60.7, PO2 171.  Respiratory acidosis noted. CXR negative for any acute infiltrate and signs of COPD.    Patient was unable to  obtain medications. - Admit to a telemetry bed - Bipap - Continuous pulse oximetry overnight with nasal cannula oxygen - Duonebs 4 times daily and as needed shortness of breath/wheezing - Brovana and budesonide neb - Solu-Medrol IV - Azithromycin IV  - Mucinex - Social work consult/case management  Leukocytosis: Acute.  WBC noted to be 11.4 suspect secondary to demarginal with recent steroid use.  Patient does not appear to be septic. - Recheck CBC in a.m.  Depression/anxiety: Patient's Lexapro restarted 03/24/17 but he reports issues with obtaining medication. - Continue Abilify and Lexapro - will need to follow up with [facility] at discharge  History of prostate and colon cancer   Tobacco use - Counseled on need of cessation of tobacco  DVT prophylaxis: Lovenox Code Status: Full code Family Communication: No family present Disposition Plan: Likely discharge in 1-2 days Consults called: None Admission status: Observation  Norval Morton MD [facility] Pager [phone]   If 7PM-7AM, please contact night-coverage [email or web address] Password [facility]  04/05/2017, 11:51 PM

## 2017-04-05 NOTE — ED Provider Notes (Signed)
[facility] EMERGENCY DEPARTMENT Provider Note   CSN: [id number] Arrival date & time: 04/05/17  2231     History   Chief Complaint Chief Complaint  Patient presents with  . COPD    HPI Taylor [name] is a 62 y.o. male.  The history is provided by the patient and medical records.  COPD  This is a recurrent problem. The current episode started 2 days ago. The problem occurs constantly. The problem has been gradually worsening. Associated symptoms include headaches and shortness of breath. Pertinent negatives include no chest pain and no abdominal pain. The symptoms are aggravated by coughing. Relieved by: breathing treatments.  Shortness of Breath  Associated symptoms include headaches, cough, sputum production and wheezing. Pertinent negatives include no fever, no sore throat, no ear pain, no neck pain, no orthopnea, no chest pain, no vomiting, no abdominal pain, no rash and no leg swelling. Associated medical issues include COPD.  -Patient was recently discharged about 9 days ago for COPD exacerbation.  Patient states after being discharged he received oxygen to wear, however because of his lack of insurance was unable to get any of his other medications filled.  Past Medical History:  Diagnosis Date  . Bipolar 1 disorder ([place]) 02/05/2012  . Cancer ([place]) 04/11/11   adenocarcinoma of colon, 7/19 nodes pos.FINISHED CHEMO/DR. SHERRILL  . COPD (chronic obstructive pulmonary disease) ([place])    SMOKER  . Depression    Bipolar disorder  . Full dentures   . Hemorrhoids   . Inguinal hernia    RIGHT- PAINFUL  . Prostate cancer ([place])    Gleason score = 7, supposed to have radiation therapy but he has not followed up  . Rib fractures    hx of  . Shortness of breath     Patient Active Problem List   Diagnosis Date Noted  . Adjustment disorder with depressed mood 03/28/2017  . Adjustment disorder with mixed disturbance of emotions and conduct   . Chronic  respiratory failure with hypoxia ([place]) 03/24/2017  . Normocytic normochromic anemia 03/24/2017  . COPD with acute exacerbation ([place]) 10/22/2016  . Acute bronchitis due to human metapneumovirus 01/16/2013  . Alcohol abuse 01/09/2013  . COPD exacerbation ([place]) 01/09/2013  . Chest pain 01/09/2013  . Acute respiratory failure with hypoxia ([place]) 01/09/2013  . Smoker 12/16/2012  . Inguinal hernia unilateral, non-recurrent, right 05/01/2012  . Dyspepsia 02/05/2012  . Hyperglycemia 02/04/2012  . COPD  GOLD III 02/03/2012  . Thrombocytopenia ([place]) 02/03/2012  . Depression   . 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 COLON SURGERY  04/11/11   Sigmoid colectomy  . COLOSTOMY REVISION  04/11/2011   Procedure: COLON RESECTION SIGMOID;  Surgeon: [name], MD;  Location: [facility];  Service: General;  Laterality: N/A;  low anterior colon resection   . INGUINAL HERNIA REPAIR Right 05/01/2012   Procedure: HERNIA REPAIR INGUINAL ADULT;  Surgeon: [name], MD;  [facility];  Service: General;  Laterality: Right;  . INSERTION OF MESH Right 05/01/2012   Procedure: INSERTION OF MESH;  Surgeon: [name], MD;  [facility];  Service: General;  Laterality: Right;  . PORT-A-CATH REMOVAL Left 05/01/2012   Procedure: REMOVAL Infusion Port;  Surgeon: [name], MD;  [facility];  Service: General;  Laterality: Left;  . PORTACATH PLACEMENT    . PORTACATH PLACEMENT  05/02/2011   Procedure: INSERTION PORT-A-CATH;  Surgeon: Sherren Mocha  Leeanne Mannan, MD;  Location: [facility];  Service: General;  Laterality: N/A;       Home Medications    Prior to Admission medications   Medication Sig Start Date End Date Taking? Authorizing Provider  albuterol (PROVENTIL HFA;VENTOLIN HFA) 108 (90 Base) MCG/ACT inhaler Inhale 1-2 puffs into the lungs every 6 (six) hours as needed for wheezing or shortness of breath. 12/11/16   Palumbo, April, MD  albuterol (PROVENTIL)  (2.5 MG/3ML) 0.083% nebulizer solution Take 6 mLs (5 mg total) by nebulization every 6 (six) hours as needed for wheezing or shortness of breath. 03/22/17   Larene Pickett, PA-C  ARIPiprazole (ABILIFY) 5 MG tablet Take 1 tablet (5 mg total) by mouth every morning. 03/27/17   Deatra James, MD  azithromycin (ZITHROMAX) 500 MG tablet As written 03/28/17   Deatra James, MD  escitalopram (LEXAPRO) 20 MG tablet Take 1 tablet (20 mg total) by mouth every morning. 03/27/17   Shahmehdi, Valeria Batman, MD  Fluticasone-Umeclidin-Vilant (TRELEGY ELLIPTA) 100-62.5-25 MCG/INH AEPB Inhale 1 puff into the lungs daily.    [provider]  lovastatin (MEVACOR) 20 MG tablet Take 1 tablet (20 mg total) by mouth at bedtime. 03/27/17   Shahmehdi, Valeria Batman, MD  mometasone-formoterol (DULERA) 200-5 MCG/ACT AERO INHALE 2 PUFFS INTO THE LUNGS FIRST THING IN THE MORNING THEN ANOTHER 2 PUFFS 12 HOURS LATER. 03/27/17   Deatra James, MD  predniSONE (DELTASONE) 20 MG tablet 2 tabs po daily x 3 days 03/27/17   [name], MD  SPIRIVA RESPIMAT 2.5 MCG/ACT AERS Inhale 2 puffs into the lungs daily. 03/22/17   Deno Etienne, DO    Family History Family History  Problem Relation Age of Onset  . Heart disease Father   . Lung cancer Maternal Uncle        smoked    Social History Social History   Tobacco Use  . Smoking status: Current Every Day Smoker    Packs/day: 0.50    Years: 45.00    Pack years: 22.50    Types: Cigarettes    Last attempt to quit: 02/11/2014    Years since quitting: 3.1  . Smokeless tobacco: Former Network engineer Use Topics  . Alcohol use: No    Comment: h/o use in the past, no h/o heavy use  . Drug use: No     Allergies   Codeine   Review of Systems Review of Systems  Constitutional: Positive for fatigue. Negative for chills and fever.  HENT: Negative for ear pain and sore throat.   Eyes: Negative for visual disturbance.  Respiratory: Positive for cough, sputum production, chest  tightness, shortness of breath and wheezing.   Cardiovascular: Negative for chest pain, palpitations, orthopnea and leg swelling.  Gastrointestinal: Negative for abdominal distention, abdominal pain, diarrhea, nausea and vomiting.  Genitourinary: Negative for dysuria.  Musculoskeletal: Negative for back pain and neck pain.  Skin: Negative for rash.  Neurological: Positive for headaches. Negative for weakness and numbness.  All other systems reviewed and are negative.    Physical Exam Updated Vital Signs BP (!) 148/83   Pulse 91   Temp (!) 97.4 F (36.3 C)   Resp (!) 21   SpO2 100%   Physical Exam  Constitutional: He is oriented to person, place, and time. He appears well-developed and well-nourished. He appears distressed.  HENT:  Head: Normocephalic and atraumatic.  Mouth/Throat: Oropharynx is clear and moist.  Eyes: Conjunctivae are normal.  Neck: Neck supple.  Cardiovascular:  Normal rate, regular rhythm and intact distal pulses.  No murmur heard. Pulmonary/Chest: Accessory muscle usage present. Tachypnea noted. He is in respiratory distress. He has decreased breath sounds (throughout). He has wheezes. He has rhonchi. He has no rales.  Abdominal: Soft. He exhibits no distension. There is no tenderness. There is no guarding.  Musculoskeletal: He exhibits no edema.  Neurological: He is alert and oriented to person, place, and time.  Skin: Skin is warm and dry.  Psychiatric: He has a normal mood and affect.  Nursing note and vitals reviewed.    ED Treatments / Results  Labs (all labs ordered are listed, but only abnormal results are displayed) Labs Reviewed  BASIC METABOLIC PANEL - Abnormal; Notable for the following components:      Result Value   Chloride 99 (*)    Glucose, Bld 113 (*)    BUN <5 (*)    Calcium 8.5 (*)    All other components within normal limits  CBC WITH DIFFERENTIAL/PLATELET - Abnormal; Notable for the following components:   WBC 11.4 (*)    All  other components within normal limits  I-STAT CHEM 8, ED - Abnormal; Notable for the following components:   Chloride 97 (*)    BUN 4 (*)    Glucose, Bld 108 (*)    Calcium, Ion 1.08 (*)    All other components within normal limits  I-STAT ARTERIAL BLOOD GAS, ED - Abnormal; Notable for the following components:   pH, Arterial 7.331 (*)    pCO2 arterial 60.7 (*)    pO2, Arterial 171.0 (*)    Bicarbonate 32.3 (*)    TCO2 34 (*)    Acid-Base Excess 4.0 (*)    All other components within normal limits  BRAIN NATRIURETIC PEPTIDE  BLOOD GAS, ARTERIAL  I-STAT TROPONIN, ED    EKG  EKG Interpretation None       Radiology No results found.  Procedures Procedures (including critical care time)  Medications Ordered in ED Medications  albuterol (PROVENTIL,VENTOLIN) solution continuous neb (15 mg/hr Nebulization New Bag/Given 04/05/17 2258)  acetaminophen (TYLENOL) tablet 1,000 mg (not administered)     Initial Impression / Assessment and Plan / ED Course  I have reviewed the triage vital signs and the nursing notes.  Pertinent labs & imaging results that were available during my care of the patient were reviewed by me and considered in my medical decision making (see chart for details).     Patient is a 62 year old male with history of COPD, bipolar 1 disorder, prior cancer, depression, chronic respiratory failure who presents with 2 days of worsening shortness of breath.  Patient was discharged about 9 days ago for COPD exacerbation, however the patient states he has not been able to get any of his medications due to his insurance since leaving the hospital.  On exam patient is in respiratory distress and tachypneic.  He has diminished breath sounds throughout and faint wheezing.  EMS gave IV Solu-Medrol, IV magnesium, Atrovent and albuterol nebulizer treatments.  Patient states he is starting to feel a little better.  Patient continued on continuous albuterol.  Will give 500 cc  bolus of fluids as well for insensible losses.  Workup as above was significant for a blood gas showing 7.33, PCO2 of 60.7 which is higher than his baseline consistent with mild hypercapnic respiratory failure.  Patient will be placed on BiPAP.    Hospitalist consulted for admission.  Final Clinical Impressions(s) / ED Diagnoses   Final  diagnoses:  COPD exacerbation ([place])  Acute on chronic respiratory failure with hypercapnia [facility])    ED Discharge Orders    None       Tobie Poet, DO 04/05/17 2346    Carmin Muskrat, MD 04/06/17 0003

## 2017-04-06 ENCOUNTER — Other Ambulatory Visit: Payer: Self-pay

## 2017-04-06 DIAGNOSIS — C61 Malignant neoplasm of prostate: Secondary | ICD-10-CM | POA: Diagnosis present

## 2017-04-06 DIAGNOSIS — D72829 Elevated white blood cell count, unspecified: Secondary | ICD-10-CM | POA: Diagnosis present

## 2017-04-06 DIAGNOSIS — Z8249 Family history of ischemic heart disease and other diseases of the circulatory system: Secondary | ICD-10-CM | POA: Diagnosis not present

## 2017-04-06 DIAGNOSIS — Z9049 Acquired absence of other specified parts of digestive tract: Secondary | ICD-10-CM | POA: Diagnosis not present

## 2017-04-06 DIAGNOSIS — J9621 Acute and chronic respiratory failure with hypoxia: Secondary | ICD-10-CM | POA: Diagnosis not present

## 2017-04-06 DIAGNOSIS — F419 Anxiety disorder, unspecified: Secondary | ICD-10-CM

## 2017-04-06 DIAGNOSIS — Z720 Tobacco use: Secondary | ICD-10-CM

## 2017-04-06 DIAGNOSIS — Z9221 Personal history of antineoplastic chemotherapy: Secondary | ICD-10-CM | POA: Diagnosis not present

## 2017-04-06 DIAGNOSIS — F319 Bipolar disorder, unspecified: Secondary | ICD-10-CM | POA: Diagnosis present

## 2017-04-06 DIAGNOSIS — D649 Anemia, unspecified: Secondary | ICD-10-CM | POA: Diagnosis present

## 2017-04-06 DIAGNOSIS — J9622 Acute and chronic respiratory failure with hypercapnia: Secondary | ICD-10-CM

## 2017-04-06 DIAGNOSIS — R739 Hyperglycemia, unspecified: Secondary | ICD-10-CM | POA: Diagnosis present

## 2017-04-06 DIAGNOSIS — Z801 Family history of malignant neoplasm of trachea, bronchus and lung: Secondary | ICD-10-CM | POA: Diagnosis not present

## 2017-04-06 DIAGNOSIS — F320 Major depressive disorder, single episode, mild: Secondary | ICD-10-CM

## 2017-04-06 DIAGNOSIS — C189 Malignant neoplasm of colon, unspecified: Secondary | ICD-10-CM

## 2017-04-06 DIAGNOSIS — J441 Chronic obstructive pulmonary disease with (acute) exacerbation: Secondary | ICD-10-CM | POA: Diagnosis present

## 2017-04-06 DIAGNOSIS — F17210 Nicotine dependence, cigarettes, uncomplicated: Secondary | ICD-10-CM | POA: Diagnosis present

## 2017-04-06 DIAGNOSIS — Z885 Allergy status to narcotic agent status: Secondary | ICD-10-CM | POA: Diagnosis not present

## 2017-04-06 DIAGNOSIS — Z9981 Dependence on supplemental oxygen: Secondary | ICD-10-CM | POA: Diagnosis not present

## 2017-04-06 DIAGNOSIS — F4321 Adjustment disorder with depressed mood: Secondary | ICD-10-CM | POA: Diagnosis present

## 2017-04-06 DIAGNOSIS — Z85038 Personal history of other malignant neoplasm of large intestine: Secondary | ICD-10-CM | POA: Diagnosis not present

## 2017-04-06 LAB — RESPIRATORY PANEL BY PCR
ADENOVIRUS-RVPPCR: NOT DETECTED
Bordetella pertussis: NOT DETECTED
CORONAVIRUS HKU1-RVPPCR: NOT DETECTED
CORONAVIRUS OC43-RVPPCR: NOT DETECTED
Chlamydophila pneumoniae: NOT DETECTED
Coronavirus 229E: NOT DETECTED
Coronavirus NL63: NOT DETECTED
Influenza A: NOT DETECTED
Influenza B: NOT DETECTED
METAPNEUMOVIRUS-RVPPCR: NOT DETECTED
Mycoplasma pneumoniae: NOT DETECTED
PARAINFLUENZA VIRUS 1-RVPPCR: NOT DETECTED
PARAINFLUENZA VIRUS 2-RVPPCR: NOT DETECTED
PARAINFLUENZA VIRUS 3-RVPPCR: NOT DETECTED
Parainfluenza Virus 4: NOT DETECTED
RHINOVIRUS / ENTEROVIRUS - RVPPCR: NOT DETECTED
Respiratory Syncytial Virus: NOT DETECTED

## 2017-04-06 LAB — BASIC METABOLIC PANEL
Anion gap: 14 (ref 5–15)
BUN: 7 mg/dL (ref 6–20)
CHLORIDE: 100 mmol/L — AB (ref 101–111)
CO2: 24 mmol/L (ref 22–32)
CREATININE: 0.91 mg/dL (ref 0.61–1.24)
Calcium: 8.3 mg/dL — ABNORMAL LOW (ref 8.9–10.3)
GFR calc non Af Amer: >60 mL/min (ref >60)
Glucose, Bld: 203 mg/dL — ABNORMAL HIGH (ref 65–99)
Potassium: 4.6 mmol/L (ref 3.5–5.1)
Sodium: 138 mmol/L (ref 135–145)

## 2017-04-06 LAB — CBG MONITORING, ED: GLUCOSE-CAPILLARY: 162 mg/dL — AB (ref 65–99)

## 2017-04-06 LAB — MAGNESIUM: Magnesium: 2.2 mg/dL (ref 1.7–2.4)

## 2017-04-06 MED ORDER — SODIUM CHLORIDE 0.9 % IV SOLN
500.0000 mg | Freq: Every day | INTRAVENOUS | Status: DC
  Administered 2017-04-06: 500 mg via INTRAVENOUS
  Filled 2017-04-06: qty 500

## 2017-04-06 MED ORDER — ARIPIPRAZOLE 5 MG PO TABS
5.0000 mg | ORAL_TABLET | Freq: Every day | ORAL | Status: DC
Start: 2017-04-06 — End: 2017-04-09
  Administered 2017-04-06 – 2017-04-09 (×4): 5 mg via ORAL
  Filled 2017-04-06 (×4): qty 1

## 2017-04-06 MED ORDER — IPRATROPIUM-ALBUTEROL 0.5-2.5 (3) MG/3ML IN SOLN: 3.0000 mL | RESPIRATORY_TRACT | Status: DC | PRN

## 2017-04-06 MED ORDER — DM-GUAIFENESIN ER 30-600 MG PO TB12
1.0000 | ORAL_TABLET | Freq: Two times a day (BID) | ORAL | Status: DC
  Administered 2017-04-06 – 2017-04-09 (×7): 1 via ORAL
  Filled 2017-04-06 (×7): qty 1

## 2017-04-06 MED ORDER — ENOXAPARIN SODIUM 40 MG/0.4ML ~~LOC~~ SOLN
40.0000 mg | Freq: Every day | SUBCUTANEOUS | Status: DC
  Administered 2017-04-09: 40 mg via SUBCUTANEOUS
  Filled 2017-04-06 (×4): qty 0.4

## 2017-04-06 MED ORDER — METHYLPREDNISOLONE SODIUM SUCC 125 MG IJ SOLR
60.0000 mg | Freq: Three times a day (TID) | INTRAMUSCULAR | Status: DC
  Administered 2017-04-06 – 2017-04-07 (×4): 60 mg via INTRAVENOUS
  Filled 2017-04-06 (×4): qty 2

## 2017-04-06 MED ORDER — SODIUM CHLORIDE 0.9 % IV SOLN
500.0000 mg | INTRAVENOUS | Status: DC
  Administered 2017-04-06: 500 mg via INTRAVENOUS
  Filled 2017-04-06: qty 500

## 2017-04-06 MED ORDER — ACETAMINOPHEN 325 MG PO TABS
650.0000 mg | ORAL_TABLET | Freq: Four times a day (QID) | ORAL | Status: DC | PRN
Start: 2017-04-06 — End: 2017-04-09
  Administered 2017-04-06: 650 mg via ORAL
  Filled 2017-04-06 (×2): qty 2

## 2017-04-06 MED ORDER — BUDESONIDE 0.5 MG/2ML IN SUSP
0.5000 mg | Freq: Two times a day (BID) | RESPIRATORY_TRACT | Status: DC
  Administered 2017-04-06 – 2017-04-09 (×7): 0.5 mg via RESPIRATORY_TRACT
  Filled 2017-04-06 (×9): qty 2

## 2017-04-06 MED ORDER — GUAIFENESIN ER 600 MG PO TB12
600.0000 mg | ORAL_TABLET | Freq: Two times a day (BID) | ORAL | Status: DC
  Filled 2017-04-06: qty 1

## 2017-04-06 MED ORDER — IPRATROPIUM-ALBUTEROL 0.5-2.5 (3) MG/3ML IN SOLN
3.0000 mL | Freq: Four times a day (QID) | RESPIRATORY_TRACT | Status: DC
  Administered 2017-04-06 – 2017-04-07 (×6): 3 mL via RESPIRATORY_TRACT
  Filled 2017-04-06 (×6): qty 3

## 2017-04-06 MED ORDER — ARFORMOTEROL TARTRATE 15 MCG/2ML IN NEBU
15.0000 ug | INHALATION_SOLUTION | Freq: Two times a day (BID) | RESPIRATORY_TRACT | Status: DC
  Administered 2017-04-06 – 2017-04-09 (×7): 15 ug via RESPIRATORY_TRACT
  Filled 2017-04-06 (×9): qty 2

## 2017-04-06 MED ORDER — SODIUM CHLORIDE 0.9 % IV SOLN
INTRAVENOUS | Status: DC
  Administered 2017-04-06 – 2017-04-09 (×5): via INTRAVENOUS

## 2017-04-06 MED ORDER — ONDANSETRON HCL 4 MG/2ML IJ SOLN: 4.0000 mg | Freq: Four times a day (QID) | INTRAMUSCULAR | Status: DC | PRN

## 2017-04-06 MED ORDER — PRAVASTATIN SODIUM 20 MG PO TABS
20.0000 mg | ORAL_TABLET | Freq: Every day | ORAL | Status: DC
  Administered 2017-04-06 – 2017-04-09 (×4): 20 mg via ORAL
  Filled 2017-04-06 (×4): qty 1

## 2017-04-06 MED ORDER — ESCITALOPRAM OXALATE 20 MG PO TABS
20.0000 mg | ORAL_TABLET | Freq: Every day | ORAL | Status: DC
  Administered 2017-04-06 – 2017-04-09 (×4): 20 mg via ORAL
  Filled 2017-04-06: qty 2
  Filled 2017-04-06 (×3): qty 1

## 2017-04-06 MED ORDER — ONDANSETRON HCL 4 MG PO TABS: 4.0000 mg | ORAL_TABLET | Freq: Four times a day (QID) | ORAL | Status: DC | PRN

## 2017-04-06 MED ORDER — ACETAMINOPHEN 650 MG RE SUPP: 650.0000 mg | Freq: Four times a day (QID) | RECTAL | Status: DC | PRN

## 2017-04-06 MED ORDER — SODIUM CHLORIDE 0.9 % IV SOLN
1.0000 g | INTRAVENOUS | Status: DC
  Administered 2017-04-06 – 2017-04-09 (×4): 1 g via INTRAVENOUS
  Filled 2017-04-06 (×4): qty 10

## 2017-04-06 NOTE — Progress Notes (Signed)
  Trial off BIPP. Patient on RA SPO2 95%. No distress noted.

## 2017-04-06 NOTE — Progress Notes (Signed)
PROGRESS NOTE    MERCER PEIFER  ZOX:[id number] DOB: 04-30-1955 DOA: 04/05/2017 PCP: Patient, No Pcp Per   Brief Narrative:  62 y.o. WM  PMHx COPD, on 2 L of oxygen 24/7, Tobacco abuse, Bipolar Disorder,Depression, Prostate cancer, and Adenocarcinoma of Colon S/P chemotherapy;   Presents with complaints of progressively worsening shortness of breath over the last 2 days.  Patient was just last hospitalized from 3/3-3/7 with acute on chronic respiratory failure with COPD exacerbation discharged home on prescriptions for azithromycin and steroid taper.  Patient reports never being able to obtain medications due to lack of funds.  Patient was subsequently seen in the emergency department on 3/7 with complaints of shortness of breath.  At some point he ended up being discharged, but patient was noted to be suicidal and did not want to be leave as nothing was going right in his life.  Therefore patient had to be readmitted 3/8 but ultimately was discharged.  Since being at the boardinghouse patient reports coming in contact with 1 of the residents that had the flu.  R generalized eports having headache, body aches, productive cough, wheezing, and worsening shortness of breath. Denies having any fever, nausea, vomiting, diarrhea, or loss of consciousness.   En route with EMS patient had been given 125 mg of IV Solu-Medrol , 2 g of magnesium sulfate IV, and DuoNeb breathing treatment.   ED Course: Upon admission into the emergency department patient was noted to be afebrile, pulse 91-110, respirations 21-26, blood pressures maintained, O2 saturations 97-100% on BiPAP.  Initial ABG revealed pH of 7.331, PCO2 60.7, PO2 171.  Chest x-ray showed hyperinflated lungs with no acute infiltrate.   Patient was given hour-long breathing treatment.  TRH called to admit.    Subjective: 3/17 /O 4, positive acute on chronic SOB, negative CP, negative abdominal pain. States does not have pulmonologist. PCP at the  [facility] manages his COPD. States stopped smoking~one year ago. Verifies uses 2 L O2 at home. Does not use CPAP.  Assessment & Plan:   Principal Problem:   Acute on chronic respiratory failure with hypoxia and hypercapnia (HCC) Active Problems:   Depression   Smoker   Leukocytosis  COPD Exacerbation/Acute on Chronic Respiratory Failure with Hypoxia and Hypercapnia/CAP -patient off BiPAP since 0300 -titrate O2 to maintain SPO2 89-93% -7 day course antibiotics -Solu-Medrol 60 mg TID -Flutter valve -Mucinex DM -DuoNeb QID -Pulmicort BID -BiPAP PRN  Depression/Anxiety -Abilify 5 mg daily -Lexapro 20 mg daily -will require follow-up at [facility] at discharge. Arrange appointment prior to discharge.  Prostate cancer  Adenocarcinoma of Colon -S/P chemotherapy  Tobacco abuse -Per patient discontinued 1 year ago      Goals of care -SOCIAL WORK: Consult per patient having some difficulty obtaining medication.    DVT prophylaxis: Lovenox Code Status: full Family Communication: none Disposition Plan: TBD   Consultants:  none  Procedures/Significant Events:     I have personally reviewed and interpreted all radiology studies and my findings are as above.  VENTILATOR SETTINGS:    Cultures   Antimicrobials: Anti-infectives (From admission, onward)   Start     Ordered Stop   04/06/17 1315  cefTRIAXone (ROCEPHIN) 1 g in sodium chloride 0.9 % 100 mL IVPB     04/06/17 1314 04/13/17 1314   04/06/17 1315  azithromycin (ZITHROMAX) 500 mg in sodium chloride 0.9 % 250 mL IVPB     04/06/17 1314 04/13/17 1314   04/06/17 0130  azithromycin (ZITHROMAX) 500 mg in sodium  chloride 0.9 % 250 mL IVPB  Status:  Discontinued     04/06/17 0022 04/06/17 1312       Devices    LINES / TUBES:      Continuous Infusions: . sodium chloride 75 mL/hr at 04/06/17 0032  . azithromycin Stopped (04/06/17 0539)     Objective: Vitals:   04/06/17 0645 04/06/17 0731 04/06/17 0808  04/06/17 0810  BP: (!) 126/98     Pulse: 84     Resp: 19     Temp:      SpO2: 97% 99% 96% 96%    Intake/Output Summary (Last 24 hours) at 04/06/2017 0852 Last data filed at 04/06/2017 0100 Gross per 24 hour  Intake 500 ml  Output -  Net 500 ml   There were no vitals filed for this visit.  Examination:  General: A/O 4, acute on chronic respiratory distress Neck:  Negative scars, masses, torticollis, lymphadenopathy, JVD Lungs: diffuse poor air movement throughout all lung fields, diffuse mild expiratory wheeze, negative crackles, negative rhonchi Cardiovascular: tachycardic,Regular rhythm without murmur gallop or rub normal S1 and S2 Abdomen: negative abdominal pain, nondistended, positive soft, bowel sounds, no rebound, no ascites, no appreciable mass Extremities: No significant cyanosis, clubbing, or edema bilateral lower extremities Skin: Negative rashes, lesions, ulcer Psychiatric:  Negative depression, negative anxiety, negative fatigue, negative mania  Central nervous system:  Cranial nerves II through XII intact, tongue/uvula midline, all extremities muscle strength 5/5, sensation intact throughout, negative dysarthria, negative expressive aphasia, negative receptive aphasia.  .     Data Reviewed: Care during the described time interval was provided by me .  I have reviewed this patient's available data, including medical history, events of note, physical examination, and all test results as part of my evaluation.   CBC: Recent Labs  Lab 04/05/17 2241 04/05/17 2249  WBC 11.4*  --   NEUTROABS 6.6  --   HGB 14.7 15.3  HCT 45.8 45.0  MCV 96.8  --   PLT 258  --    Basic Metabolic Panel: Recent Labs  Lab 04/05/17 2241 04/05/17 2249  NA 139 140  K 4.1 3.9  CL 99* 97*  CO2 29  --   GLUCOSE 113* 108*  BUN <5* 4*  CREATININE 0.77 0.80  CALCIUM 8.5*  --    GFR: Estimated Creatinine Clearance: 102 mL/min (by C-G formula based on SCr of 0.8 mg/dL). Liver  Function Tests: No results for input(s): AST, ALT, ALKPHOS, BILITOT, PROT, ALBUMIN in the last 168 hours. No results for input(s): LIPASE, AMYLASE in the last 168 hours. No results for input(s): AMMONIA in the last 168 hours. Coagulation Profile: No results for input(s): INR, PROTIME in the last 168 hours. Cardiac Enzymes: No results for input(s): CKTOTAL, CKMB, CKMBINDEX, TROPONINI in the last 168 hours. BNP (last 3 results) No results for input(s): PROBNP in the last 8760 hours. HbA1C: No results for input(s): HGBA1C in the last 72 hours. CBG: No results for input(s): GLUCAP in the last 168 hours. Lipid Profile: No results for input(s): CHOL, HDL, LDLCALC, TRIG, CHOLHDL, LDLDIRECT in the last 72 hours. Thyroid Function Tests: No results for input(s): TSH, T4TOTAL, FREET4, T3FREE, THYROIDAB in the last 72 hours. Anemia Panel: No results for input(s): VITAMINB12, FOLATE, FERRITIN, TIBC, IRON, RETICCTPCT in the last 72 hours. Urine analysis:    Component Value Date/Time   COLORURINE YELLOW 10/18/2016 1855   APPEARANCEUR HAZY (A) 10/18/2016 1855   LABSPEC 1.028 10/18/2016 1855   PHURINE 5.0 10/18/2016  [place] 10/18/2016 1855   HGBUR NEGATIVE 10/18/2016 1855   BILIRUBINUR NEGATIVE 10/18/2016 1855   KETONESUR NEGATIVE 10/18/2016 1855   PROTEINUR 100 (A) 10/18/2016 1855   UROBILINOGEN 1.0 01/09/2013 1843   NITRITE NEGATIVE 10/18/2016 1855   LEUKOCYTESUR NEGATIVE 10/18/2016 1855   Sepsis Labs: @LABRCNTIP (procalcitonin:4,lacticidven:4)  )No results found for this or any previous visit (from the past 240 hour(s)).       Radiology Studies: Dg Chest Port 1 View  Result Date: 04/06/2017 CLINICAL DATA:  Dyspnea.  COPD. EXAM: PORTABLE CHEST 1 VIEW COMPARISON:  03/27/2017 chest radiograph. FINDINGS: Stable cardiomediastinal silhouette with normal heart size. No pneumothorax. No pleural effusion. Hyperinflated lungs and emphysema. No pulmonary edema. No acute  consolidative airspace disease. Stable healed upper right rib deformities. IMPRESSION: 1. Hyperinflated lungs and emphysema, compatible with COPD. 2. No acute cardiopulmonary disease. Electronically Signed   By: Ilona Sorrel M.D.   On: 04/06/2017 00:07        Scheduled Meds: . arformoterol  15 mcg Nebulization BID  . ARIPiprazole  5 mg Oral Daily  . budesonide (PULMICORT) nebulizer solution  0.5 mg Nebulization BID  . enoxaparin (LOVENOX) injection  40 mg Subcutaneous Daily  . escitalopram  20 mg Oral Daily  . guaiFENesin  600 mg Oral BID  . ipratropium-albuterol  3 mL Nebulization QID  . methylPREDNISolone (SOLU-MEDROL) injection  60 mg Intravenous Q8H  . pravastatin  20 mg Oral q1800   Continuous Infusions: . sodium chloride 75 mL/hr at 04/06/17 0032  . azithromycin Stopped (04/06/17 0539)     LOS: 0 days    Time spent: 40 minutes    WOODS, Geraldo Docker, MD [facility] Pager [phone]   If 7PM-7AM, please contact night-coverage [email or web address] Password [facility] 04/06/2017, 8:52 AM

## 2017-04-06 NOTE — Progress Notes (Signed)
Pt off bipap. Resting comfortably- no distress noted. RT will continue to monitor.

## 2017-04-06 NOTE — ED Notes (Signed)
Doctor at bedside.

## 2017-04-07 LAB — BASIC METABOLIC PANEL
ANION GAP: 8 (ref 5–15)
BUN: 9 mg/dL (ref 6–20)
CALCIUM: 8.8 mg/dL — AB (ref 8.9–10.3)
CO2: 32 mmol/L (ref 22–32)
Chloride: 101 mmol/L (ref 101–111)
Creatinine, Ser: 0.68 mg/dL (ref 0.61–1.24)
GFR calc Af Amer: >60 mL/min (ref >60)
GFR calc non Af Amer: >60 mL/min (ref >60)
GLUCOSE: 154 mg/dL — AB (ref 65–99)
POTASSIUM: 4.8 mmol/L (ref 3.5–5.1)
SODIUM: 141 mmol/L (ref 135–145)

## 2017-04-07 LAB — CBC
HCT: 40.2 % (ref 39.0–52.0)
HEMOGLOBIN: 12.8 g/dL — AB (ref 13.0–17.0)
MCH: 30.5 pg (ref 26.0–34.0)
MCHC: 31.8 g/dL (ref 30.0–36.0)
MCV: 95.9 fL (ref 78.0–100.0)
PLATELETS: 266 10*3/uL (ref 150–400)
RBC: 4.19 MIL/uL — ABNORMAL LOW (ref 4.22–5.81)
RDW: 13.3 % (ref 11.5–15.5)
WBC: 19.8 10*3/uL — ABNORMAL HIGH (ref 4.0–10.5)

## 2017-04-07 LAB — MAGNESIUM: MAGNESIUM: 2.5 mg/dL — AB (ref 1.7–2.4)

## 2017-04-07 LAB — TROPONIN I: Troponin I: <0.03 ng/mL (ref <0.03)

## 2017-04-07 MED ORDER — AZITHROMYCIN 250 MG PO TABS
500.0000 mg | ORAL_TABLET | Freq: Every day | ORAL | Status: DC
  Administered 2017-04-07 – 2017-04-08 (×2): 500 mg via ORAL
  Filled 2017-04-07 (×2): qty 2

## 2017-04-07 MED ORDER — METHYLPREDNISOLONE SODIUM SUCC 40 MG IJ SOLR
40.0000 mg | Freq: Three times a day (TID) | INTRAMUSCULAR | Status: DC
  Administered 2017-04-07 – 2017-04-09 (×6): 40 mg via INTRAVENOUS
  Filled 2017-04-07 (×6): qty 1

## 2017-04-07 MED ORDER — NITROGLYCERIN 0.4 MG SL SUBL: 0.4000 mg | SUBLINGUAL_TABLET | SUBLINGUAL | Status: DC | PRN

## 2017-04-07 MED ORDER — LORAZEPAM 2 MG/ML IJ SOLN
1.0000 mg | Freq: Once | INTRAMUSCULAR | Status: AC
  Administered 2017-04-07: 1 mg via INTRAVENOUS
  Filled 2017-04-07: qty 1

## 2017-04-07 MED ORDER — IPRATROPIUM-ALBUTEROL 0.5-2.5 (3) MG/3ML IN SOLN
3.0000 mL | Freq: Three times a day (TID) | RESPIRATORY_TRACT | Status: DC
  Administered 2017-04-07 – 2017-04-09 (×6): 3 mL via RESPIRATORY_TRACT
  Filled 2017-04-07 (×6): qty 3

## 2017-04-07 NOTE — Progress Notes (Signed)
PROGRESS NOTE    Taylor [name]  LKG:[id number] DOB: 10/01/1955 DOA: 04/05/2017 PCP: Patient, No Pcp Per   Brief Narrative:  62 year old with history of COPD on 2 L nasal cannula at home, tobacco use, bipolar disorder, depression, prostate cancer, adenocarcinoma of colon status post chemotherapy came to the hospital with complains of progressive shortness of breath over the course of last 2 days.  Apparently he was here for the same reason during the first week of March 2019 and was discharged on steroids and azithromycin which he never filled due to lack of funds and since then had visited ER couple of times.  This time he was admitted due to severe respiratory distress requiring BiPAP.   Assessment & Plan:   Principal Problem:   Acute on chronic respiratory failure with hypoxia and hypercapnia (HCC) Active Problems:   Depression   Smoker   Leukocytosis  Acute on chronic hypoxic respiratory failure, improved Acute moderate exacerbation of mild to moderate persistent COPD - At this time we will continue IV Solu-Medrol.  Still requiring greater than 3 L of nasal cannula supplemental oxygen which is more than his baseline -Continue nebulizer treatments scheduled and as necessary -Mucinex as needed, incentive spirometry and flutter valve -Already on Pulmicort twice daily -Continue antibiotics to complete 7-day course -Respiratory panel done on March 23, 2017 was negative  History of anxiety and depression -Continue Abilify and Lexapro.  Needs outpatient follow-up at [facility]  History of tobacco use -Currently he is not using any but recently he has relapsed and then quit again.  Advised him to refrain from using it, nicotine patch if necessary  History of adenocarcinoma of Colon -Status post chemotherapy  History of prostate cancer -Outpatient follow-up   DVT prophylaxis: Lovenox Code Status: Full code Family Communication: None at bedside Disposition Plan: Maintain  inpatient stay for more IV steroids and supplemental oxygen  Consultants:   None  Procedures:   None  Antimicrobials:   Azithromycin   Subjective: States she he feels short of breath with minimal exertion even going to the bathroom.  Shortness of breath at rest has slightly improved from yesterday.  Review of Systems Otherwise negative except as per HPI, including: General: Denies fever, chills, night sweats or unintended weight loss. Resp: Denies cough, wheezing Cardiac: Denies chest pain, palpitations, orthopnea, paroxysmal nocturnal dyspnea. GI: Denies abdominal pain, nausea, vomiting, diarrhea or constipation GU: Denies dysuria, frequency, hesitancy or incontinence MS: Denies muscle aches, joint pain or swelling Neuro: Denies headache, neurologic deficits (focal weakness, numbness, tingling), abnormal gait Psych: Denies anxiety, depression, SI/HI/AVH Skin: Denies new rashes or lesions ID: Denies sick contacts, exotic exposures, travel  Objective: Vitals:   04/06/17 2131 04/07/17 0529 04/07/17 0832 04/07/17 1145  BP: (!) 154/83 125/72    Pulse: 82 77    Resp: 20 16    Temp: 97.9 F (36.6 C) 98.1 F (36.7 C)    TempSrc: Oral Oral    SpO2: 95% 99% 95% 97%  Weight:      Height:        Intake/Output Summary (Last 24 hours) at 04/07/2017 1350 Last data filed at 04/07/2017 1157 Gross per 24 hour  Intake 2532.5 ml  Output 1975 ml  Net 557.5 ml   Filed Weights   04/06/17 1639  Weight: 75.9 kg (167 lb 5.3 oz)    Examination:  General exam: Appears calm and comfortable, on 3-4 L nasal cannula Respiratory system: Diffuse diminished breath sounds Cardiovascular system: S1 & S2 heard, RRR.  No JVD, murmurs, rubs, gallops or clicks. No pedal edema. Gastrointestinal system: Abdomen is nondistended, soft and nontender. No organomegaly or masses felt. Normal bowel sounds heard. Central nervous system: Alert and oriented. No focal neurological deficits. Extremities:  Symmetric 5 x 5 power. Skin: No rashes, lesions or ulcers Psychiatry: Judgement and insight appear normal. Mood & affect appropriate.     Data Reviewed:   CBC: Recent Labs  Lab 04/05/17 2241 04/05/17 2249 04/07/17 0254  WBC 11.4*  --  19.8*  NEUTROABS 6.6  --   --   HGB 14.7 15.3 12.8*  HCT 45.8 45.0 40.2  MCV 96.8  --  95.9  PLT 258  --  295   Basic Metabolic Panel: Recent Labs  Lab 04/05/17 2241 04/05/17 2249 04/06/17 1019 04/07/17 0254  NA 139 140 138 141  K 4.1 3.9 4.6 4.8  CL 99* 97* 100* 101  CO2 29  --  24 32  GLUCOSE 113* 108* 203* 154*  BUN <5* 4* 7 9  CREATININE 0.77 0.80 0.91 0.68  CALCIUM 8.5*  --  8.3* 8.8*  MG  --   --  2.2 2.5*   GFR: Estimated Creatinine Clearance: 102 mL/min (by C-G formula based on SCr of 0.68 mg/dL). Liver Function Tests: No results for input(s): AST, ALT, ALKPHOS, BILITOT, PROT, ALBUMIN in the last 168 hours. No results for input(s): LIPASE, AMYLASE in the last 168 hours. No results for input(s): AMMONIA in the last 168 hours. Coagulation Profile: No results for input(s): INR, PROTIME in the last 168 hours. Cardiac Enzymes: No results for input(s): CKTOTAL, CKMB, CKMBINDEX, TROPONINI in the last 168 hours. BNP (last 3 results) No results for input(s): PROBNP in the last 8760 hours. HbA1C: No results for input(s): HGBA1C in the last 72 hours. CBG: Recent Labs  Lab 04/06/17 1116  GLUCAP 162*   Lipid Profile: No results for input(s): CHOL, HDL, LDLCALC, TRIG, CHOLHDL, LDLDIRECT in the last 72 hours. Thyroid Function Tests: No results for input(s): TSH, T4TOTAL, FREET4, T3FREE, THYROIDAB in the last 72 hours. Anemia Panel: No results for input(s): VITAMINB12, FOLATE, FERRITIN, TIBC, IRON, RETICCTPCT in the last 72 hours. Sepsis Labs: No results for input(s): PROCALCITON, LATICACIDVEN in the last 168 hours.  Recent Results (from the past 240 hour(s))  Respiratory Panel by PCR     Status: None   Collection Time:  04/06/17 11:10 AM  Result Value Ref Range Status   Adenovirus NOT DETECTED NOT DETECTED Final   Coronavirus 229E NOT DETECTED NOT DETECTED Final   Coronavirus HKU1 NOT DETECTED NOT DETECTED Final   Coronavirus NL63 NOT DETECTED NOT DETECTED Final   Coronavirus OC43 NOT DETECTED NOT DETECTED Final   Metapneumovirus NOT DETECTED NOT DETECTED Final   Rhinovirus / Enterovirus NOT DETECTED NOT DETECTED Final   Influenza A NOT DETECTED NOT DETECTED Final   Influenza B NOT DETECTED NOT DETECTED Final   Parainfluenza Virus 1 NOT DETECTED NOT DETECTED Final   Parainfluenza Virus 2 NOT DETECTED NOT DETECTED Final   Parainfluenza Virus 3 NOT DETECTED NOT DETECTED Final   Parainfluenza Virus 4 NOT DETECTED NOT DETECTED Final   Respiratory Syncytial Virus NOT DETECTED NOT DETECTED Final   Bordetella pertussis NOT DETECTED NOT DETECTED Final   Chlamydophila pneumoniae NOT DETECTED NOT DETECTED Final   Mycoplasma pneumoniae NOT DETECTED NOT DETECTED Final    Comment: Performed at [facility], [address], [place], [place] [address]         Radiology Studies:  Dg Chest Port 1 View  Result Date: 04/06/2017 CLINICAL DATA:  Dyspnea.  COPD. EXAM: PORTABLE CHEST 1 VIEW COMPARISON:  03/27/2017 chest radiograph. FINDINGS: Stable cardiomediastinal silhouette with normal heart size. No pneumothorax. No pleural effusion. Hyperinflated lungs and emphysema. No pulmonary edema. No acute consolidative airspace disease. Stable healed upper right rib deformities. IMPRESSION: 1. Hyperinflated lungs and emphysema, compatible with COPD. 2. No acute cardiopulmonary disease. Electronically Signed   By: Ilona Sorrel M.D.   On: 04/06/2017 00:07        Scheduled Meds: . arformoterol  15 mcg Nebulization BID  . ARIPiprazole  5 mg Oral Daily  . azithromycin  500 mg Oral QHS  . budesonide (PULMICORT) nebulizer solution  0.5 mg Nebulization BID  . dextromethorphan-guaiFENesin  1 tablet Oral BID  . enoxaparin  (LOVENOX) injection  40 mg Subcutaneous Daily  . escitalopram  20 mg Oral Daily  . ipratropium-albuterol  3 mL Nebulization QID  . methylPREDNISolone (SOLU-MEDROL) injection  40 mg Intravenous Q8H  . pravastatin  20 mg Oral q1800   Continuous Infusions: . sodium chloride 75 mL/hr at 04/07/17 0356  . cefTRIAXone (ROCEPHIN)  IV Stopped (04/07/17 1256)     LOS: 1 day    Time spent: 30 mins    Ankit Arsenio Loader, MD [facility] Pager [phone]   If 7PM-7AM, please contact night-coverage [email or web address] Password [email or web address] 04/07/2017, 1:50 PM

## 2017-04-07 NOTE — Progress Notes (Signed)
Patient c/o CP 10/10, states "feels like something is sitting on my chest and I feel anxious". MD paged and made aware. EKG obtained showing NSR and ativan administered. VSS. Labs also ordered.

## 2017-04-07 NOTE — Progress Notes (Signed)
PHARMACIST - PHYSICIAN COMMUNICATION DR:   Reesa Chew CONCERNING: Antibiotic IV to Oral Route Change Policy  RECOMMENDATION: This patient is receiving Azithromycin by the intravenous route.  Based on criteria approved by the Pharmacy and Therapeutics Committee, the antibiotic(s) is/are being converted to the equivalent oral dose form(s).   DESCRIPTION: These criteria include:  Patient being treated for a respiratory tract infection, urinary tract infection, cellulitis or clostridium difficile associated diarrhea if on metronidazole  The patient is not neutropenic and does not exhibit a GI malabsorption state  The patient is eating (either orally or via tube) and/or has been taking other orally administered medications for a least 24 hours  The patient is improving clinically and has a Tmax < 100.5.   It is noted that the elevation in WBC is likely related to steroid use. The patient has been weaned off Bi-PAP and is on home O2 of 2L  If you have questions about this conversion, please contact the Pharmacy Department  []   [phone] )  [facility] []   [phone] )  [facility] [x]   [phone] )  [facility] []   [phone] )  [facility] []   [phone] )  Lindy, PharmD, BCPS Pager: [phone] 11:15 AM

## 2017-04-08 LAB — BASIC METABOLIC PANEL
ANION GAP: 11 (ref 5–15)
BUN: 14 mg/dL (ref 6–20)
CALCIUM: 8.6 mg/dL — AB (ref 8.9–10.3)
CO2: 30 mmol/L (ref 22–32)
CREATININE: 0.72 mg/dL (ref 0.61–1.24)
Chloride: 98 mmol/L — ABNORMAL LOW (ref 101–111)
GFR calc non Af Amer: >60 mL/min (ref >60)
Glucose, Bld: 167 mg/dL — ABNORMAL HIGH (ref 65–99)
Potassium: 4.3 mmol/L (ref 3.5–5.1)
SODIUM: 139 mmol/L (ref 135–145)

## 2017-04-08 LAB — HIV ANTIBODY (ROUTINE TESTING W REFLEX): HIV Screen 4th Generation wRfx: NONREACTIVE

## 2017-04-08 LAB — CBC
HEMATOCRIT: 38.9 % — AB (ref 39.0–52.0)
Hemoglobin: 12.1 g/dL — ABNORMAL LOW (ref 13.0–17.0)
MCH: 30.6 pg (ref 26.0–34.0)
MCHC: 31.1 g/dL (ref 30.0–36.0)
MCV: 98.2 fL (ref 78.0–100.0)
PLATELETS: 269 10*3/uL (ref 150–400)
RBC: 3.96 MIL/uL — ABNORMAL LOW (ref 4.22–5.81)
RDW: 13.3 % (ref 11.5–15.5)
WBC: 23.1 10*3/uL — AB (ref 4.0–10.5)

## 2017-04-08 LAB — TROPONIN I
Troponin I: <0.03 ng/mL (ref <0.03)
Troponin I: <0.03 ng/mL (ref <0.03)

## 2017-04-08 LAB — MAGNESIUM: MAGNESIUM: 2.2 mg/dL (ref 1.7–2.4)

## 2017-04-08 MED ORDER — ALPRAZOLAM 0.5 MG PO TABS
1.0000 mg | ORAL_TABLET | Freq: Once | ORAL | Status: AC
  Administered 2017-04-08: 1 mg via ORAL
  Filled 2017-04-08: qty 2

## 2017-04-08 NOTE — Care Management (Signed)
CM met with Mr Duell about medication needs. He has been living at [place] since his last ED visit. Patient appears to be happy staying at this facility. CM inquired about hospital f/u and he was suppose to go to [facility] for an appointment but missed it. He is waiting to hear about his benefits through the [place] and disability which he states he was told he will hear this week.  CM inquired about medications. He f/u with [facility] after last admission but misunderstood where to pick up his medications. He went to the [facility] pharmacy instead of the [facility]. CM spoke to [facility] and she will order the medications he needs and resend to the Health Department so he can get his medications after d/c. He will need to either d/c early in the day or be covered medication wise so he can go to [facility] the next am to get his medications.  Pt is oxygen dependent at home. He gets his oxygen through the [place]. He states they made a delivery to the [facility] this am with new tanks. He also has a Conservation officer, nature. He does not have oxygen for transportation at d/c. He will either need a portable tank or PTAR to his ILF. He does have a friend that provides transportation at times and he may be able to assist Carrolyn Meiers at ph: [phone]). CM following.

## 2017-04-08 NOTE — Progress Notes (Signed)
PROGRESS NOTE    Taylor [name]  ZOX:[id number] DOB: [birth year] DOA: 04/05/2017 PCP: Patient, No Pcp Per   Brief Narrative:  62 year old with history of COPD on 2 L nasal cannula at home, tobacco use, bipolar disorder, depression, prostate cancer, adenocarcinoma of colon status post chemotherapy came to the hospital with complains of progressive shortness of breath over the course of last 2 days.  Apparently he was here for the same reason during the first week of March 2019 and was discharged on steroids and azithromycin which he never filled due to lack of funds and since then had visited ER couple of times.  This time he was admitted due to severe respiratory distress requiring BiPAP. Patient has been improving much lower than expected on nebulizers and IV Solu-Medrol.   Assessment & Plan:   Principal Problem:   Acute on chronic respiratory failure with hypoxia and hypercapnia (HCC) Active Problems:   Depression   Smoker   Leukocytosis  Acute on chronic hypoxic respiratory failure,  improving but slowly requiring 3 L of nasal cannula Acute moderate exacerbation of mild to moderate persistent COPD - At this time we will continue IV Solu-Medrol.   patient still requiring 3 L nasal cannula especially with ambulating.  He may end up requiring home oxygen  -Continue nebulizer treatments scheduled and as necessary -Mucinex as needed, incentive spirometry and flutter valve -Continue Pulmicort twice daily -Continue antibiotics to complete 7-day course-currently on Rocephin and azithromycin -Respiratory panel done on March 23, 2017 was negative  History of anxiety and depression, stable -Continue Abilify and Lexapro.  Needs [facility] at [facility]  History of tobacco use -Currently he is not using any but recently he has relapsed and then quit again.  Advised him to refrain from using it, nicotine patch if necessary  History of adenocarcinoma of Colon -Status post  chemotherapy  History of prostate cancer -Outpatient follow-up   DVT prophylaxis: Lovenox Code Status: Full code Family Communication: None at bedside Disposition Plan: Maintain inpatient stay for another 24 hours for IV steroids  Consultants:   None  Procedures:   None  Antimicrobials:   Azithromycin   Rocephin States he feels slightly better   Subjective: States he feels slightly better than yesterday but still gets very short of breath when ambulating across the room.  Review of Systems Otherwise negative except as per HPI, including: General: Denies fever, chills, night sweats or unintended weight loss. Resp: Denies cough, wheezing Cardiac: Denies chest pain, palpitations, orthopnea, paroxysmal nocturnal dyspnea. GI: Denies abdominal pain, nausea, vomiting, diarrhea or constipation GU: Denies dysuria, frequency, hesitancy or incontinence MS: Denies muscle aches, joint pain or swelling Neuro: Denies headache, neurologic deficits (focal weakness, numbness, tingling), abnormal gait Psych: Denies anxiety, depression, SI/HI/AVH Skin: Denies new rashes or lesions ID: Denies sick contacts, exotic exposures, travel  Objective: Vitals:   04/07/17 1957 04/07/17 2122 04/08/17 0601 04/08/17 0742  BP:  (!) 154/77 (!) 143/82   Pulse:  88 90   Resp:      Temp:  97.9 F (36.6 C) 98.1 F (36.7 C)   TempSrc:  Oral Oral   SpO2: 97%  97% 97%  Weight:      Height:        Intake/Output Summary (Last 24 hours) at 04/08/2017 1049 Last data filed at 04/08/2017 0900 Gross per 24 hour  Intake 2865 ml  Output 3300 ml  Net -435 ml   Filed Weights   04/06/17 1639  Weight: 75.9 kg (167 lb  5.3 oz)    Examination:  General exam: Appears calm and comfortable, on 3 L nasal cannula Respiratory system: Diffuse diminished breath sounds Cardiovascular system: S1 & S2 heard, RRR. No JVD, murmurs, rubs, gallops or clicks. No pedal edema. Gastrointestinal system: Abdomen is  nondistended, soft and nontender. No organomegaly or masses felt. Normal bowel sounds heard. Central nervous system: Alert and oriented. No focal neurological deficits. Extremities: Symmetric 5 x 5 power. Skin: No rashes, lesions or ulcers Psychiatry: Judgement and insight appear normal. Mood & affect appropriate.     Data Reviewed:   CBC: Recent Labs  Lab 04/05/17 2241 04/05/17 2249 04/07/17 0254 04/08/17 0339  WBC 11.4*  --  19.8* 23.1*  NEUTROABS 6.6  --   --   --   HGB 14.7 15.3 12.8* 12.1*  HCT 45.8 45.0 40.2 38.9*  MCV 96.8  --  95.9 98.2  PLT 258  --  266 182   Basic Metabolic Panel: Recent Labs  Lab 04/05/17 2241 04/05/17 2249 04/06/17 1019 04/07/17 0254 04/08/17 0339  NA 139 140 138 141 139  K 4.1 3.9 4.6 4.8 4.3  CL 99* 97* 100* 101 98*  CO2 29  --  24 32 30  GLUCOSE 113* 108* 203* 154* 167*  BUN <5* 4* 7 9 14   CREATININE 0.77 0.80 0.91 0.68 0.72  CALCIUM 8.5*  --  8.3* 8.8* 8.6*  MG  --   --  2.2 2.5* 2.2   GFR: Estimated Creatinine Clearance: 102 mL/min (by C-G formula based on SCr of 0.72 mg/dL). Liver Function Tests: No results for input(s): AST, ALT, ALKPHOS, BILITOT, PROT, ALBUMIN in the last 168 hours. No results for input(s): LIPASE, AMYLASE in the last 168 hours. No results for input(s): AMMONIA in the last 168 hours. Coagulation Profile: No results for input(s): INR, PROTIME in the last 168 hours. Cardiac Enzymes: Recent Labs  Lab 04/07/17 2213 04/08/17 0339  TROPONINI <0.03 <0.03   BNP (last 3 results) No results for input(s): PROBNP in the last 8760 hours. HbA1C: No results for input(s): HGBA1C in the last 72 hours. CBG: Recent Labs  Lab 04/06/17 1116  GLUCAP 162*   Lipid Profile: No results for input(s): CHOL, HDL, LDLCALC, TRIG, CHOLHDL, LDLDIRECT in the last 72 hours. Thyroid Function Tests: No results for input(s): TSH, T4TOTAL, FREET4, T3FREE, THYROIDAB in the last 72 hours. Anemia Panel: No results for input(s):  VITAMINB12, FOLATE, FERRITIN, TIBC, IRON, RETICCTPCT in the last 72 hours. Sepsis Labs: No results for input(s): PROCALCITON, LATICACIDVEN in the last 168 hours.  Recent Results (from the past 240 hour(s))  Respiratory Panel by PCR     Status: None   Collection Time: 04/06/17 11:10 AM  Result Value Ref Range Status   Adenovirus NOT DETECTED NOT DETECTED Final   Coronavirus 229E NOT DETECTED NOT DETECTED Final   Coronavirus HKU1 NOT DETECTED NOT DETECTED Final   Coronavirus NL63 NOT DETECTED NOT DETECTED Final   Coronavirus OC43 NOT DETECTED NOT DETECTED Final   Metapneumovirus NOT DETECTED NOT DETECTED Final   Rhinovirus / Enterovirus NOT DETECTED NOT DETECTED Final   Influenza A NOT DETECTED NOT DETECTED Final   Influenza B NOT DETECTED NOT DETECTED Final   Parainfluenza Virus 1 NOT DETECTED NOT DETECTED Final   Parainfluenza Virus 2 NOT DETECTED NOT DETECTED Final   Parainfluenza Virus 3 NOT DETECTED NOT DETECTED Final   Parainfluenza Virus 4 NOT DETECTED NOT DETECTED Final   Respiratory Syncytial Virus NOT DETECTED NOT DETECTED Final  Bordetella pertussis NOT DETECTED NOT DETECTED Final   Chlamydophila pneumoniae NOT DETECTED NOT DETECTED Final   Mycoplasma pneumoniae NOT DETECTED NOT DETECTED Final    Comment: Performed at [facility], [place] [address] [place], [place] [address]  Culture, blood (routine x 2) Call MD if unable to obtain prior to antibiotics being given     Status: None (Preliminary result)   Collection Time: 04/06/17  1:50 PM  Result Value Ref Range Status   Specimen Description BLOOD LEFT HAND  Final   Special Requests   Final    BOTTLES DRAWN AEROBIC ONLY Blood Culture adequate volume   Culture   Final    NO GROWTH 2 DAYS Performed at [facility], [place] [address], [place], [place] [address]    Report Status PENDING  Incomplete  Culture, blood (routine x 2) Call MD if unable to obtain prior to antibiotics being given     Status: None (Preliminary  result)   Collection Time: 04/06/17  1:51 PM  Result Value Ref Range Status   Specimen Description BLOOD LEFT ARM  Final   Special Requests   Final    BOTTLES DRAWN AEROBIC AND ANAEROBIC Blood Culture results may not be optimal due to an excessive volume of blood received in culture bottles   Culture   Final    NO GROWTH 2 DAYS Performed at [facility], [place] [address], [place], [place] [address]    Report Status PENDING  Incomplete         Radiology Studies: No results found.      Scheduled Meds: . arformoterol  15 mcg Nebulization BID  . ARIPiprazole  5 mg Oral Daily  . azithromycin  500 mg Oral QHS  . budesonide (PULMICORT) nebulizer solution  0.5 mg Nebulization BID  . dextromethorphan-guaiFENesin  1 tablet Oral BID  . enoxaparin (LOVENOX) injection  40 mg Subcutaneous Daily  . escitalopram  20 mg Oral Daily  . ipratropium-albuterol  3 mL Nebulization TID  . methylPREDNISolone (SOLU-MEDROL) injection  40 mg Intravenous Q8H  . pravastatin  20 mg Oral q1800   Continuous Infusions: . sodium chloride 75 mL/hr at 04/07/17 0356  . cefTRIAXone (ROCEPHIN)  IV Stopped (04/07/17 1256)     LOS: 2 days    Time spent: 30 mins    Ankit Arsenio Loader, MD [facility] Pager [phone]   If 7PM-7AM, please contact night-coverage [email or web address] Password [facility] 04/08/2017, 10:49 AM

## 2017-04-08 NOTE — Progress Notes (Signed)
SATURATION QUALIFICATIONS: (This note is used to comply with regulatory documentation for home oxygen)  Patient Saturations on Room Air at Rest = 90%  Patient Saturations on Room Air while Ambulating = 86%  Patient Saturations on 3 Liters of oxygen while Ambulating = 88%  Please briefly explain why patient needs home oxygen:

## 2017-04-09 DIAGNOSIS — Z720 Tobacco use: Secondary | ICD-10-CM

## 2017-04-09 DIAGNOSIS — J9621 Acute and chronic respiratory failure with hypoxia: Secondary | ICD-10-CM

## 2017-04-09 DIAGNOSIS — J441 Chronic obstructive pulmonary disease with (acute) exacerbation: Secondary | ICD-10-CM

## 2017-04-09 DIAGNOSIS — D649 Anemia, unspecified: Secondary | ICD-10-CM

## 2017-04-09 LAB — BASIC METABOLIC PANEL
Anion gap: 8 (ref 5–15)
BUN: 11 mg/dL (ref 6–20)
CHLORIDE: 99 mmol/L — AB (ref 101–111)
CO2: 35 mmol/L — AB (ref 22–32)
CREATININE: 0.65 mg/dL (ref 0.61–1.24)
Calcium: 8.6 mg/dL — ABNORMAL LOW (ref 8.9–10.3)
GFR calc non Af Amer: >60 mL/min (ref >60)
Glucose, Bld: 204 mg/dL — ABNORMAL HIGH (ref 65–99)
Potassium: 4.3 mmol/L (ref 3.5–5.1)
Sodium: 142 mmol/L (ref 135–145)

## 2017-04-09 LAB — CBC
HCT: 38.1 % — ABNORMAL LOW (ref 39.0–52.0)
HEMOGLOBIN: 11.8 g/dL — AB (ref 13.0–17.0)
MCH: 30.9 pg (ref 26.0–34.0)
MCHC: 31 g/dL (ref 30.0–36.0)
MCV: 99.7 fL (ref 78.0–100.0)
Platelets: 239 10*3/uL (ref 150–400)
RBC: 3.82 MIL/uL — ABNORMAL LOW (ref 4.22–5.81)
RDW: 13.2 % (ref 11.5–15.5)
WBC: 17.5 10*3/uL — AB (ref 4.0–10.5)

## 2017-04-09 LAB — MAGNESIUM: Magnesium: 2.2 mg/dL (ref 1.7–2.4)

## 2017-04-09 MED ORDER — ALBUTEROL SULFATE (2.5 MG/3ML) 0.083% IN NEBU: 2.5000 mg | INHALATION_SOLUTION | Freq: Four times a day (QID) | RESPIRATORY_TRACT | 0 refills | Status: DC | PRN

## 2017-04-09 MED ORDER — FLUTICASONE-UMECLIDIN-VILANT 100-62.5-25 MCG/INH IN AEPB: 1.0000 | INHALATION_SPRAY | Freq: Every day | RESPIRATORY_TRACT | 0 refills | Status: DC

## 2017-04-09 MED ORDER — ALBUTEROL SULFATE HFA 108 (90 BASE) MCG/ACT IN AERS: 1.0000 | INHALATION_SPRAY | Freq: Four times a day (QID) | RESPIRATORY_TRACT | 0 refills | Status: DC | PRN

## 2017-04-09 MED ORDER — PREDNISONE 20 MG PO TABS
40.0000 mg | ORAL_TABLET | Freq: Every day | ORAL | Status: DC
  Administered 2017-04-09: 40 mg via ORAL
  Filled 2017-04-09: qty 2

## 2017-04-09 MED ORDER — LOVASTATIN 20 MG PO TABS: 20.0000 mg | ORAL_TABLET | Freq: Every day | ORAL | 0 refills | Status: DC

## 2017-04-09 MED ORDER — PREDNISONE 10 MG PO TABS: ORAL_TABLET | ORAL | 0 refills | Status: DC

## 2017-04-09 NOTE — Progress Notes (Signed)
SATURATION QUALIFICATIONS: (This note is used to comply with regulatory documentation for home oxygen)  Patient Saturations on Room Air at Rest = 88%  Patient Saturations on Room Air while Ambulating = 90%  Patient Saturations on 2 Liters of oxygen while Ambulating = 96%  Please briefly explain why patient needs home oxygen: The patient desats to upper 80s on room air.

## 2017-04-09 NOTE — Progress Notes (Signed)
Instructed pt in use of Flutter device.  Good return demonstration, pt able to repeat back instructions accurately.  Encouraged use on own Q! W/a.

## 2017-04-09 NOTE — Progress Notes (Signed)
NCM spoke with patient over the phone, he states he has oxygen at home,  They delivered it to his home yesterday per patient, but he will need transport with [facility] to get home because he does not have a tank here, his oxygen is with the [place].  The address he will be going to is [address], [place] [address].  NCM informed him that he will need to see [name] at [facility] by tomorrow so she can help him with his medications, he states he will see her tomorrow afternoon.  NCM [place]

## 2017-04-09 NOTE — Discharge Instructions (Signed)
Please get your medications reviewed and adjusted by your Primary MD. ° °Please request your Primary MD to go over all Hospital Tests and Procedure/Radiological results at the follow up, please get all Hospital records sent to your Prim MD by signing hospital release before you go home. ° °If you had Pneumonia of Lung problems at the Hospital: °Please get a 2 view Chest X ray done in 6-8 weeks after hospital discharge or sooner if instructed by your Primary MD. ° °If you have Congestive Heart Failure: °Please call your Cardiologist or Primary MD anytime you have any of the following symptoms:  °1) 3 pound weight gain in 24 hours or 5 pounds in 1 week  °2) shortness of breath, with or without a dry hacking cough  °3) swelling in the hands, feet or stomach  °4) if you have to sleep on extra pillows at night in order to breathe ° °Follow cardiac low salt diet and 1.5 lit/day fluid restriction. ° °If you have diabetes °Accuchecks 4 times/day, Once in AM empty stomach and then before each meal. °Log in all results and show them to your primary doctor at your next visit. °If any glucose reading is under 80 or above 300 call your primary MD immediately. ° °If you have Seizure/Convulsions/Epilepsy: °Please do not drive, operate heavy machinery, participate in activities at heights or participate in high speed sports until you have seen by Primary MD or a Neurologist and advised to do so again. ° °If you had Gastrointestinal Bleeding: °Please ask your Primary MD to check a complete blood count within one week of discharge or at your next visit. Your endoscopic/colonoscopic biopsies that are pending at the time of discharge, will also need to followed by your Primary MD. ° °Get Medicines reviewed and adjusted. °Please take all your medications with you for your next visit with your Primary MD ° °Please request your Primary MD to go over all hospital tests and procedure/radiological results at the follow up, please ask your  Primary MD to get all Hospital records sent to his/her office. ° °If you experience worsening of your admission symptoms, develop shortness of breath, life threatening emergency, suicidal or homicidal thoughts you must seek medical attention immediately by calling 911 or calling your MD immediately  if symptoms less severe. ° °You must read complete instructions/literature along with all the possible adverse reactions/side effects for all the Medicines you take and that have been prescribed to you. Take any new Medicines after you have completely understood and accpet all the possible adverse reactions/side effects.  ° °Do not drive or operate heavy machinery when taking Pain medications.  ° °Do not take more than prescribed Pain, Sleep and Anxiety Medications ° °Special Instructions: If you have smoked or chewed Tobacco  in the last 2 yrs please stop smoking, stop any regular Alcohol  and or any Recreational drug use. ° °Wear Seat belts while driving. ° °Please note °You were cared for by a hospitalist during your hospital stay. If you have any questions about your discharge medications or the care you received while you were in the hospital after you are discharged, you can call the unit and asked to speak with the hospitalist on call if the hospitalist that took care of you is not available. Once you are discharged, your primary care physician will handle any further medical issues. Please note that NO REFILLS for any discharge medications will be authorized once you are discharged, as it is imperative that you   return to your primary care physician (or establish a relationship with a primary care physician if you do not have one) for your aftercare needs so that they can reassess your need for medications and monitor your lab values. ° °You can reach the hospitalist office at phone [phone] or fax [phone] °  °If you do not have a primary care physician, you can call [phone] for a physician  referral. ° ° °Chronic Obstructive Pulmonary Disease Exacerbation °Chronic obstructive pulmonary disease (COPD) is a common lung problem. In COPD, the flow of air from the lungs is limited. COPD exacerbations are times that breathing gets worse and you need extra treatment. Without treatment they can be life threatening. If they happen often, your lungs can become more damaged. If your COPD gets worse, your doctor may treat you with: °· Medicines. °· Oxygen. °· Different ways to clear your airway, such as using a mask. ° °Follow these instructions at home: °· Do not smoke. °· Avoid tobacco smoke and other things that bother your lungs. °· If given, take your antibiotic medicine as told. Finish the medicine even if you start to feel better. °· Only take medicines as told by your doctor. °· Drink enough fluids to keep your pee (urine) clear or pale yellow (unless your doctor has told you not to). °· Use a cool mist machine (vaporizer). °· If you use oxygen or a machine that turns liquid medicine into a mist (nebulizer), continue to use them as told. °· Keep up with shots (vaccinations) as told by your doctor. °· Exercise regularly. °· Eat healthy foods. °· Keep all doctor visits as told. °Get help right away if: °· You are very short of breath and it gets worse. °· You have trouble talking. °· You have bad chest pain. °· You have blood in your spit (sputum). °· You have a fever. °· You keep throwing up (vomiting). °· You feel weak, or you pass out (faint). °· You feel confused. °· You keep getting worse. °This information is not intended to replace advice given to you by your health care provider. Make sure you discuss any questions you have with your health care provider. °Document Released: 12/27/2010 Document Revised: 06/15/2015 Document Reviewed: 09/11/2012 °[employer] Interactive Patient Education © 2017 [employer] ° °

## 2017-04-09 NOTE — Discharge Summary (Signed)
Physician Discharge Summary  Taylor [name] LKG:[id number] DOB: November 03, 1955  PCP: [name], NP  Admit date: 04/05/2017 Discharge date: 04/09/2017  Recommendations for Outpatient Follow-up:  1. [name], NP/PCP on 04/10/17.  Recommend repeating labs (CBC, BMP & A1c) in 1 week. Patient also advised to go there by case management for assistance regarding medications. 2. [name], NP, [facility] on 04/15/17 at 9:30 AM. 3. Pillager: None Equipment/Devices: None  Discharge Condition: Improved and stable. CODE STATUS: Full. Diet recommendation: Heart healthy diet.  Discharge Diagnoses:  Principal Problem:   Acute on chronic respiratory failure with hypoxia and hypercapnia (HCC) Active Problems:   Depression   Smoker   Leukocytosis   Brief Summary: 62 year old male, lives at "boarding home", independent of daily activities, PMH of chronic hypoxic respiratory failure on home oxygen 2 L/min, tobacco abuse, bipolar disorder, depression, prostate cancer, adenocarcinoma of colon status post treatment and said to be in remission, presented to ED due to progressive dyspnea of 2 days duration.  Recently discharged in early March for same reason but never filled medications due to lack of funds and since then has had visits to ED a couple of times.  He was admitted due to acute on chronic hypoxic respiratory failure due to COPD exacerbation and briefly required BiPAP.  Assessment and plan:  1. Acute on chronic hypoxic respiratory failure: Due to COPD exacerbation which was treated as below.  He briefly required BiPAP.  Acute respiratory failure resolved and patient back on prior home dose of oxygen.  Case management was consulted and assisted patient with arranging for home oxygen needs, medications and PCP follow-up. 2. COPD exacerbation: Treated with IV Solu-Medrol, bronchodilator nebulizations, Pulmicort and Brovana.  He completed 4 days  of IV ceftriaxone and azithromycin.  Flutter valve was added.  RSV panel 04/06/17 negative.  Blood cultures x2: Negative to date.  Improved and bronchospasm seems to have resolved.  Discussed with Pulmonology and patient will be discharged on Trilegy Ellipta, albuterol inhaler/nebulization as needed, oral prednisone taper and pulmonology have arranged close outpatient follow-up for further evaluation and management.  Tobacco cessation was counseled.  No further antibiotics at discharge. 3. Tobacco abuse: Cessation counseled. 4. Bipolar disorder: Stable without suicidal or homicidal ideations or audiovisual hallucinations.  Continue prior home Abilify and Lexapro.  States that he follows up with [facility] [facility]. 5. History of adenocarcinoma of colon: Reportedly in remission based on outpatient oncology notes in 2014.  Patient was supposed to follow-up with [facility] for surveillance colonoscopy and with oncology but does not appear to have done that.  He was reminded and counseled regarding outpatient follow-up with both these physicians and he verbalized understanding.  PCP can coordinate outpatient follow-up. 6. History of prostate cancer: Again does not seem to have followed up with anyone recently.  Recommend outpatient follow-up and PCP can coordinate this. 7. Normocytic anemia: Unclear etiology.?  Dilutional.  No bleeding reported.  Periodic outpatient follow-up and evaluation as deemed necessary. 8. Leukocytosis: Likely due to steroid effect. 9. Elevated blood pressure: Mild.  Reassess during outpatient follow-up to determine need for antihypertensives.  Steroids may also be contributing and stopping them should improve. 10. Hyperglycemia: Last A1c in 2014 was 6.2.  Recommend checking A1c as outpatient.  Tapering and discontinuing steroids will also help.   Consultations:  None  Procedures:  BiPAP   Discharge Instructions  Discharge Instructions    Call MD for:  difficulty  breathing, headache  or visual disturbances   Complete by:  As directed    Call MD for:  extreme fatigue   Complete by:  As directed    Call MD for:  persistant dizziness or light-headedness   Complete by:  As directed    Diet - low sodium heart healthy   Complete by:  As directed    Increase activity slowly   Complete by:  As directed        Medication List    STOP taking these medications   azithromycin 500 MG tablet Commonly known as:  ZITHROMAX   mometasone-formoterol 200-5 MCG/ACT Aero Commonly known as:  DULERA   SPIRIVA RESPIMAT 2.5 MCG/ACT Aers Generic drug:  Tiotropium Bromide Monohydrate     TAKE these medications   albuterol 108 (90 Base) MCG/ACT inhaler Commonly known as:  PROVENTIL HFA;VENTOLIN HFA Inhale 1-2 puffs into the lungs every 6 (six) hours as needed for wheezing or shortness of breath. What changed:  Another medication with the same name was changed. Make sure you understand how and when to take each.   albuterol (2.5 MG/3ML) 0.083% nebulizer solution Commonly known as:  PROVENTIL Take 3 mLs (2.5 mg total) by nebulization every 6 (six) hours as needed for wheezing or shortness of breath. What changed:  how much to take   ARIPiprazole 5 MG tablet Commonly known as:  ABILIFY Take 1 tablet (5 mg total) by mouth every morning.   escitalopram 20 MG tablet Commonly known as:  LEXAPRO Take 1 tablet (20 mg total) by mouth every morning.   Fluticasone-Umeclidin-Vilant 100-62.5-25 MCG/INH Aepb Commonly known as:  TRELEGY ELLIPTA Inhale 1 puff into the lungs daily.   lovastatin 20 MG tablet Commonly known as:  MEVACOR Take 1 tablet (20 mg total) by mouth at bedtime.   OXYGEN Inhale 2 L into the lungs.   predniSONE 10 MG tablet Commonly known as:  DELTASONE Take 4 tabs daily for 3 days, then 3 tabs daily for 3 days, then 2 tabs daily for 3 days, then 1 tab daily for 3 days, then stop. Start taking on:  04/10/2017      Follow-up Information     [name], [name], NP Follow up on 04/10/2017.   Why:  please go by to see [name] with [facility], so she can assist you with your medications Contact information: [address]  [place] [address] [phone]        [name], NP Follow up on 04/15/2017.   Specialty:  Pulmonary Disease Why:  at 930 am  Contact information: [address] [address] [phone]        [facility]. Schedule an appointment as soon as possible for a visit.   Specialty:  [facility] information: [place] [address] [phone]          Allergies  Allergen Reactions  . Codeine Nausea And Vomiting      Procedures/Studies: Dg Chest Port 1 View  Result Date: 04/06/2017 CLINICAL DATA:  Dyspnea.  COPD. EXAM: PORTABLE CHEST 1 VIEW COMPARISON:  03/27/2017 chest radiograph. FINDINGS: Stable cardiomediastinal silhouette with normal heart size. No pneumothorax. No pleural effusion. Hyperinflated lungs and emphysema. No pulmonary edema. No acute consolidative airspace disease. Stable healed upper right rib deformities. IMPRESSION: 1. Hyperinflated lungs and emphysema, compatible with COPD. 2. No acute cardiopulmonary disease. Electronically Signed   By: Ilona Sorrel M.D.   On: 04/06/2017 00:07     Subjective: Patient denies any  further dyspnea and indicates that his breathing is back to baseline.  He has intermittent mild dry cough improved compared to admission.  Reports on and off episodes of anxiety.  Denies suicidal or homicidal ideations or audiovisual hallucinations.  No chest pain, fever or chills.  As per nursing, no acute issues reported.  Discharge Exam:  Vitals:   04/08/17 2124 04/09/17 0500 04/09/17 0824 04/09/17 0838  BP: (!) 143/80 (!) 160/88    Pulse: 89 78    Resp: 18 16    Temp: 97.7 F (36.5 C) (!) 97.4 F (36.3 C)    TempSrc: Oral Oral    SpO2: 99% 98% 99% 95%  Weight:      Height:        General: Pleasant middle-aged  male, moderately built and nourished, lying comfortably propped up in bed. Cardiovascular: S1 & S2 heard, RRR, S1/S2 +. No murmurs, rubs, gallops or clicks. No JVD or pedal edema.  Telemetry personally reviewed: Sinus rhythm. Respiratory: Slightly distant breath sounds but clear to auscultation without wheezing, rhonchi or crackles. No increased work of breathing.  Able to speak in full sentences without difficulty. Abdominal:  Non distended, non tender & soft. No organomegaly or masses appreciated. Normal bowel sounds heard. CNS: Alert and oriented. No focal deficits. Extremities: no edema, no cyanosis Psychiatric: Pleasant and appropriate affect.    The results of significant diagnostics from this hospitalization (including imaging, microbiology, ancillary and laboratory) are listed below for reference.     Microbiology: Recent Results (from the past 240 hour(s))  Respiratory Panel by PCR     Status: None   Collection Time: 04/06/17 11:10 AM  Result Value Ref [name] Status   Adenovirus NOT DETECTED NOT DETECTED Final   Coronavirus 229E NOT DETECTED NOT DETECTED Final   Coronavirus HKU1 NOT DETECTED NOT DETECTED Final   Coronavirus NL63 NOT DETECTED NOT DETECTED Final   Coronavirus OC43 NOT DETECTED NOT DETECTED Final   Metapneumovirus NOT DETECTED NOT DETECTED Final   Rhinovirus / Enterovirus NOT DETECTED NOT DETECTED Final   Influenza A NOT DETECTED NOT DETECTED Final   Influenza B NOT DETECTED NOT DETECTED Final   Parainfluenza Virus 1 NOT DETECTED NOT DETECTED Final   Parainfluenza Virus 2 NOT DETECTED NOT DETECTED Final   Parainfluenza Virus 3 NOT DETECTED NOT DETECTED Final   Parainfluenza Virus 4 NOT DETECTED NOT DETECTED Final   Respiratory Syncytial Virus NOT DETECTED NOT DETECTED Final   Bordetella pertussis NOT DETECTED NOT DETECTED Final   Chlamydophila pneumoniae NOT DETECTED NOT DETECTED Final   Mycoplasma pneumoniae NOT DETECTED NOT DETECTED Final    Comment:  Performed at [facility], [address] [place], [place] [address]  Culture, blood (routine x 2) Call MD if unable to obtain prior to antibiotics being given     Status: None (Preliminary result)   Collection Time: 04/06/17  1:50 PM  Result Value Ref [name] Status   Specimen Description BLOOD LEFT HAND  Final   Special Requests   Final    BOTTLES DRAWN AEROBIC ONLY Blood Culture adequate volume   Culture   Final    NO GROWTH 2 DAYS Performed at [facility], [place] [address], [place], [place] [address]    Report Status PENDING  Incomplete  Culture, blood (routine x 2) Call MD if unable to obtain prior to antibiotics being given     Status: None (Preliminary result)   Collection Time: 04/06/17  1:51 PM  Result Value  Ref [name] Status   Specimen Description BLOOD LEFT ARM  Final   Special Requests   Final    BOTTLES DRAWN AEROBIC AND ANAEROBIC Blood Culture results may not be optimal due to an excessive volume of blood received in culture bottles   Culture   Final    NO GROWTH 2 DAYS Performed at [facility], [place] [address], [place], [place] [address]    Report Status PENDING  Incomplete     Labs: CBC: Recent Labs  Lab 04/05/17 2241 04/05/17 2249 04/07/17 0254 04/08/17 0339 04/09/17 0300  WBC 11.4*  --  19.8* 23.1* 17.5*  NEUTROABS 6.6  --   --   --   --   HGB 14.7 15.3 12.8* 12.1* 11.8*  HCT 45.8 45.0 40.2 38.9* 38.1*  MCV 96.8  --  95.9 98.2 99.7  PLT 258  --  266 269 732   Basic Metabolic Panel: Recent Labs  Lab 04/05/17 2241 04/05/17 2249 04/06/17 1019 04/07/17 0254 04/08/17 0339 04/09/17 0300  NA 139 140 138 141 139 142  K 4.1 3.9 4.6 4.8 4.3 4.3  CL 99* 97* 100* 101 98* 99*  CO2 29  --  24 32 30 35*  GLUCOSE 113* 108* 203* 154* 167* 204*  BUN <5* 4* 7 9 14 11   CREATININE 0.77 0.80 0.91 0.68 0.72 0.65  CALCIUM 8.5*  --  8.3* 8.8* 8.6* 8.6*  MG  --   --  2.2 2.5* 2.2 2.2   BNP (last 3 results) Recent Labs    12/12/16 1248  04/05/17 2241  BNP 63.3 43.0   Cardiac Enzymes: Recent Labs  Lab 04/07/17 2213 04/08/17 0339 04/08/17 0938  TROPONINI <0.03 <0.03 <0.03   CBG: Recent Labs  Lab 04/06/17 1116  GLUCAP 162*       Time coordinating discharge: Over 30 minutes  SIGNED:  Vernell Leep, MD, FACP, [facility]. [facility] Pager [phone]  If 7PM-7AM, please contact night-coverage [email or web address] Password [email or web address] 04/09/2017, 1:11 PM

## 2017-04-10 ENCOUNTER — Encounter (HOSPITAL_COMMUNITY): Payer: Self-pay | Admitting: Emergency Medicine

## 2017-04-10 ENCOUNTER — Emergency Department (HOSPITAL_COMMUNITY)
Admission: EM | Admit: 2017-04-10 | Discharge: 2017-04-11 | Disposition: A | Discharge status: Home / Self Care | Payer: Medicaid Other | Attending: Emergency Medicine | Admitting: Emergency Medicine

## 2017-04-10 DIAGNOSIS — Z79899 Other long term (current) drug therapy: Secondary | ICD-10-CM | POA: Insufficient documentation

## 2017-04-10 DIAGNOSIS — J441 Chronic obstructive pulmonary disease with (acute) exacerbation: Secondary | ICD-10-CM | POA: Insufficient documentation

## 2017-04-10 DIAGNOSIS — Z8546 Personal history of malignant neoplasm of prostate: Secondary | ICD-10-CM | POA: Insufficient documentation

## 2017-04-10 DIAGNOSIS — R0602 Shortness of breath: Secondary | ICD-10-CM | POA: Diagnosis present

## 2017-04-10 DIAGNOSIS — Z85038 Personal history of other malignant neoplasm of large intestine: Secondary | ICD-10-CM | POA: Diagnosis not present

## 2017-04-10 DIAGNOSIS — F17210 Nicotine dependence, cigarettes, uncomplicated: Secondary | ICD-10-CM | POA: Diagnosis not present

## 2017-04-10 MED ORDER — ACETAMINOPHEN 325 MG PO TABS
650.0000 mg | ORAL_TABLET | Freq: Once | ORAL | Status: AC
  Administered 2017-04-10: 650 mg via ORAL
  Filled 2017-04-10: qty 2

## 2017-04-10 NOTE — ED Triage Notes (Signed)
Per [facility] pt comes in from half way house for exacerbated COPD. Pt requested Xanax and Morphine with EMS because that was what he was given in hospital for his anxiety.  2 Duo nebs given in route as well as  125mg  Solu-medrol given via in 20g Right AC. Vitals: 162/96, 92HR, 22R, 97-99 neb mask. Wears 2L O2 continuously.

## 2017-04-10 NOTE — ED Notes (Signed)
Pt O2 was between 94-97% while ambulating in the hallway. Pt was steady on his feet but did complain of feeling winded.

## 2017-04-10 NOTE — ED Provider Notes (Signed)
[facility] DEPT Provider Note   CSN: [id number] Arrival date & time: 04/10/17  1828     History   Chief Complaint No chief complaint on file.   HPI Taylor [name] is a 62 y.o. male.  HPI   62 year old male with history of COPD, tobacco abuse, bipolar, remote prostate cancer, on home oxygen at 2 L, brought here via EMS from home in a halfway house for shortness of breath likely due to exacerbation COPD.  Prior to arrival, patient received 2 DuoNeb's on route and 125 mg of Solu-Medrol.  Patient was initially seen in the ER on 04/05/2017 for shortness of breath.  He was found to be in hypercapnic respiratory failure secondary to his COPD exacerbation requiring a transient BiPAP and admission to the hospital.  He was subsequently discharged home yesterday to a halfway house.  He is here because patient report not receiving the prescribed medications he was given during his recent authorization.  Patient complaining of having increased shortness of breath, and wheezing throughout the day today without having any medication on board.  He report feeling anxious, with a throbbing moderate intensity headache nonradiating without any vision change.  Denies having fever, chills, URI symptoms, active chest pain, productive cough, hemoptysis, abdominal back pain or leg swelling.  Denies any focal numbness or weakness.  No other complaint.   Past Medical History:  Diagnosis Date  . Bipolar 1 disorder ([place]) 02/05/2012  . Cancer ([place]) 04/11/11   adenocarcinoma of colon, 7/19 nodes pos.FINISHED CHEMO/DR. SHERRILL  . COPD (chronic obstructive pulmonary disease) ([place])    SMOKER  . Depression    Bipolar disorder  . Full dentures   . Hemorrhoids   . Inguinal hernia    RIGHT- PAINFUL  . Prostate cancer ([place])    Gleason score = 7, supposed to have radiation therapy but he has not followed up  . Rib fractures    hx of  . Shortness of breath     Patient Active  Problem List   Diagnosis Date Noted  . Acute on chronic respiratory failure with hypoxia and hypercapnia ([place]) 04/06/2017  . Leukocytosis 04/06/2017  . Adjustment disorder with depressed mood 03/28/2017  . Adjustment disorder with mixed disturbance of emotions and conduct   . Chronic respiratory failure with hypoxia ([place]) 03/24/2017  . Normocytic normochromic anemia 03/24/2017  . COPD with acute exacerbation ([place]) 10/22/2016  . Acute bronchitis due to human metapneumovirus 01/16/2013  . Alcohol abuse 01/09/2013  . COPD exacerbation ([place]) 01/09/2013  . Chest pain 01/09/2013  . Acute respiratory failure with hypoxia ([place]) 01/09/2013  . Smoker 12/16/2012  . Inguinal hernia unilateral, non-recurrent, right 05/01/2012  . Dyspepsia 02/05/2012  . Hyperglycemia 02/04/2012  . COPD  GOLD III 02/03/2012  . Thrombocytopenia ([place]) 02/03/2012  . Depression   . 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 COLON SURGERY  04/11/11   Sigmoid colectomy  . COLOSTOMY REVISION  04/11/2011   Procedure: COLON RESECTION SIGMOID;  Surgeon: [name], MD;  Location: [facility];  Service: General;  Laterality: N/A;  low anterior colon resection   . INGUINAL HERNIA REPAIR Right 05/01/2012   Procedure: HERNIA REPAIR INGUINAL ADULT;  Surgeon: [name], MD;  Location: [facility];  Service: General;  Laterality: Right;  . INSERTION OF MESH Right 05/01/2012   Procedure: INSERTION OF MESH;  Surgeon: [name], MD;  Location: [facility];  Service: General;  Laterality: Right;  . PORT-A-CATH REMOVAL Left 05/01/2012   Procedure: REMOVAL Infusion Port;  Surgeon: [name], MD;  Location: [facility];  Service: General;  Laterality: Left;  . PORTACATH PLACEMENT    . PORTACATH PLACEMENT  05/02/2011   Procedure: INSERTION PORT-A-CATH;  Surgeon: [name], MD;  Location: [facility];  Service: General;  Laterality: N/A;       Home Medications    Prior to Admission medications    Medication Sig Start Date End Date Taking? Authorizing Provider  albuterol (PROVENTIL HFA;VENTOLIN HFA) 108 (90 Base) MCG/ACT inhaler Inhale 1-2 puffs into the lungs every 6 (six) hours as needed for wheezing or shortness of breath. 04/09/17   Hongalgi, Lenis Dickinson, MD  albuterol (PROVENTIL) (2.5 MG/3ML) 0.083% nebulizer solution Take 3 mLs (2.5 mg total) by nebulization every 6 (six) hours as needed for wheezing or shortness of breath. 04/09/17   Hongalgi, Lenis Dickinson, MD  ARIPiprazole (ABILIFY) 5 MG tablet Take 1 tablet (5 mg total) by mouth every morning. 03/27/17   Shahmehdi, Valeria Batman, MD  escitalopram (LEXAPRO) 20 MG tablet Take 1 tablet (20 mg total) by mouth every morning. 03/27/17   Shahmehdi, Valeria Batman, MD  Fluticasone-Umeclidin-Vilant (TRELEGY ELLIPTA) 100-62.5-25 MCG/INH AEPB Inhale 1 puff into the lungs daily. 04/09/17   Hongalgi, Lenis Dickinson, MD  lovastatin (MEVACOR) 20 MG tablet Take 1 tablet (20 mg total) by mouth at bedtime. 04/09/17   Hongalgi, Lenis Dickinson, MD  OXYGEN Inhale 2 L into the lungs.    [provider]  predniSONE (DELTASONE) 10 MG tablet Take 4 tabs daily for 3 days, then 3 tabs daily for 3 days, then 2 tabs daily for 3 days, then 1 tab daily for 3 days, then stop. 04/10/17   Hongalgi, Lenis Dickinson, MD    Family History Family History  Problem Relation Age of Onset  . Heart disease Father   . Lung cancer Maternal Uncle        smoked    Social History Social History   Tobacco Use  . Smoking status: Current Every Day Smoker    Packs/day: 0.50    Years: 45.00    Pack years: 22.50    Types: Cigarettes    Last attempt to quit: 02/11/2014    Years since quitting: 3.1  . Smokeless tobacco: Former Network engineer Use Topics  . Alcohol use: No    Comment: h/o use in the past, no h/o heavy use  . Drug use: No     Allergies   Codeine   Review of Systems Review of Systems  All other systems reviewed and are negative.    Physical Exam Updated Vital Signs BP (!) 162/90 (BP  Location: Right Arm)   Pulse 94   Temp 98.3 F (36.8 C) (Oral)   Resp 18   SpO2 97%   Physical Exam  Constitutional: He is oriented to person, place, and time. He appears well-developed and well-nourished. No distress.  Well-appearing in no acute discomfort, resting in bed  HENT:  Head: Atraumatic.  Patient wearing supplemental oxygen  Eyes: Pupils are equal, round, and reactive to light. Conjunctivae and EOM are normal.  Neck: Normal range of motion. Neck supple. No JVD present. No tracheal deviation present.  No nuchal rigidity  Cardiovascular: Normal rate, regular rhythm and intact distal pulses.  Pulmonary/Chest: Effort normal and breath sounds normal. No stridor. No respiratory distress. He has no wheezes. He has no rales.  No active wheezing, rales, or rhonchi  heard  Abdominal: Soft. He exhibits no distension. There is no tenderness.  Musculoskeletal: He exhibits no edema.  Neurological: He is alert and oriented to person, place, and time.  Skin: No rash noted.  Psychiatric: He has a normal mood and affect.  Nursing note and vitals reviewed.    ED Treatments / Results  Labs (all labs ordered are listed, but only abnormal results are displayed) Labs Reviewed - No data to display  EKG  EKG Interpretation None       Radiology No results found.  Procedures Procedures (including critical care time)  Medications Ordered in ED Medications  acetaminophen (TYLENOL) tablet 650 mg (has no administration in time range)     Initial Impression / Assessment and Plan / ED Course  I have reviewed the triage vital signs and the nursing notes.  Pertinent labs & imaging results that were available during my care of the patient were reviewed by me and considered in my medical decision making (see chart for details).     BP (!) 161/79 (BP Location: Left Arm)   Pulse 88   Temp 98.3 F (36.8 C) (Oral)   Resp 18   SpO2 97%    Final Clinical Impressions(s) / ED  Diagnoses   Final diagnoses:  COPD exacerbation [facility])    ED Discharge Orders    None     7:47 PM Patient report having worsening shortness of breath after discharge from the hospital yesterday due to COPD exacerbation.  He attributed to not receiving his home medication.  He did receive breathing treatment prior to arrival, he is currently in no acute respiratory discomfort, no active wheezing on exam, and no hypoxia.  Does endorse throbbing headache, no red flags.  Tylenol given for his headache even though patient very specifically requesting for morphine and Xanax.  8:03 PM We will have patient ambulate while checking oxygen status.  He is in no acute respiratory discomfort at this time.  11:21 PM Patient ambulates well maintaining O2 above 94%'s.  Patient was monitor the past several hours without any acute hypoxia.  Patient is stable for discharge.  In the previous note, case management was consulted and assist patient with arranging for home oxygen need, medication and PCP follow-up.         Domenic Moras, PA-C 04/10/17 2325    Dorie Rank, MD 04/13/17 [phone]

## 2017-04-11 LAB — CULTURE, BLOOD (ROUTINE X 2)
CULTURE: NO GROWTH
Culture: NO GROWTH
Special Requests: ADEQUATE

## 2017-04-15 ENCOUNTER — Ambulatory Visit (INDEPENDENT_AMBULATORY_CARE_PROVIDER_SITE_OTHER): Payer: Medicaid Other | Admitting: Adult Health

## 2017-04-15 ENCOUNTER — Encounter: Payer: Self-pay | Admitting: Adult Health

## 2017-04-15 DIAGNOSIS — J441 Chronic obstructive pulmonary disease with (acute) exacerbation: Secondary | ICD-10-CM | POA: Diagnosis not present

## 2017-04-15 DIAGNOSIS — J9611 Chronic respiratory failure with hypoxia: Secondary | ICD-10-CM | POA: Diagnosis not present

## 2017-04-15 DIAGNOSIS — F17200 Nicotine dependence, unspecified, uncomplicated: Secondary | ICD-10-CM | POA: Diagnosis not present

## 2017-04-15 MED ORDER — ALBUTEROL SULFATE (2.5 MG/3ML) 0.083% IN NEBU: 2.5000 mg | INHALATION_SOLUTION | Freq: Four times a day (QID) | RESPIRATORY_TRACT | 5 refills | Status: DC

## 2017-04-15 MED ORDER — IPRATROPIUM BROMIDE 0.02 % IN SOLN: 0.5000 mg | Freq: Four times a day (QID) | RESPIRATORY_TRACT | 5 refills | Status: DC

## 2017-04-15 NOTE — Assessment & Plan Note (Signed)
Smoking cessation  

## 2017-04-15 NOTE — Addendum Note (Signed)
Addended by: Parke Poisson E on: 04/15/2017 12:49 PM   Modules accepted: Orders
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
## 2017-04-15 NOTE — Assessment & Plan Note (Signed)
Continue on oxygen 

## 2017-04-15 NOTE — Patient Instructions (Addendum)
Begin Albuterol and Atrovent nebulizer 4 times daily Mucinex DM twice daily as needed for cough and congestion may use store brand Work on no smoking Continue on oxygen 2 L Follow up with Dr. Melvyn Novas  In 6 weeks and As needed   Please contact office for sooner follow up if symptoms do not improve or worsen or seek emergency care
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
## 2017-04-16 ENCOUNTER — Telehealth: Payer: Self-pay | Admitting: Adult Health

## 2017-04-16 NOTE — Telephone Encounter (Signed)
Spoke with patient. He stated that the albuterol neb solution is too expensive. He uses Paediatric nurse on [address]. He wants to know if there any alternatives.   TP, please advise. Thanks!

## 2017-04-16 NOTE — Progress Notes (Signed)
Chart and office note reviewed in detail  > agree with a/p as outlined    

## 2017-04-17 NOTE — Telephone Encounter (Signed)
Per TP: why is this so expensive?  It should be on the $4 list.  Did anyone call the pharmacy to see what's going on?  [employer] and spoke with pharmacy tech Melanie - Albuterol neb solution is $9 per box.  However, pt needs 6 boxes for the QID directions so his total is now $46.    It may be necessary to try referring to [facility].  Can triage call in the morning to check the price for patient?

## 2017-04-18 ENCOUNTER — Observation Stay (HOSPITAL_COMMUNITY)
Admission: EM | Admit: 2017-04-18 | Discharge: 2017-04-19 | Disposition: A | Discharge status: Home / Self Care | Payer: Medicaid Other | Attending: Internal Medicine | Admitting: Internal Medicine

## 2017-04-18 ENCOUNTER — Emergency Department (HOSPITAL_COMMUNITY): Payer: Medicaid Other

## 2017-04-18 ENCOUNTER — Other Ambulatory Visit: Payer: Self-pay

## 2017-04-18 ENCOUNTER — Encounter (HOSPITAL_COMMUNITY): Payer: Self-pay | Admitting: Emergency Medicine

## 2017-04-18 ENCOUNTER — Telehealth: Payer: Self-pay | Admitting: Adult Health

## 2017-04-18 DIAGNOSIS — J441 Chronic obstructive pulmonary disease with (acute) exacerbation: Secondary | ICD-10-CM | POA: Diagnosis present

## 2017-04-18 DIAGNOSIS — Z79899 Other long term (current) drug therapy: Secondary | ICD-10-CM | POA: Diagnosis not present

## 2017-04-18 DIAGNOSIS — F319 Bipolar disorder, unspecified: Secondary | ICD-10-CM | POA: Insufficient documentation

## 2017-04-18 DIAGNOSIS — J440 Chronic obstructive pulmonary disease with acute lower respiratory infection: Secondary | ICD-10-CM | POA: Diagnosis not present

## 2017-04-18 DIAGNOSIS — Z885 Allergy status to narcotic agent status: Secondary | ICD-10-CM | POA: Diagnosis not present

## 2017-04-18 DIAGNOSIS — J9621 Acute and chronic respiratory failure with hypoxia: Secondary | ICD-10-CM | POA: Insufficient documentation

## 2017-04-18 DIAGNOSIS — I700 Atherosclerosis of aorta: Secondary | ICD-10-CM | POA: Diagnosis not present

## 2017-04-18 DIAGNOSIS — Z85038 Personal history of other malignant neoplasm of large intestine: Secondary | ICD-10-CM | POA: Insufficient documentation

## 2017-04-18 DIAGNOSIS — Z87891 Personal history of nicotine dependence: Secondary | ICD-10-CM | POA: Insufficient documentation

## 2017-04-18 DIAGNOSIS — N281 Cyst of kidney, acquired: Secondary | ICD-10-CM | POA: Diagnosis not present

## 2017-04-18 DIAGNOSIS — Z8546 Personal history of malignant neoplasm of prostate: Secondary | ICD-10-CM | POA: Insufficient documentation

## 2017-04-18 DIAGNOSIS — Z9981 Dependence on supplemental oxygen: Secondary | ICD-10-CM | POA: Insufficient documentation

## 2017-04-18 LAB — URINALYSIS, ROUTINE W REFLEX MICROSCOPIC
Bilirubin Urine: NEGATIVE
Glucose, UA: >=500 mg/dL — AB
Hgb urine dipstick: NEGATIVE
Ketones, ur: 5 mg/dL — AB
Leukocytes, UA: NEGATIVE
Nitrite: NEGATIVE
Protein, ur: NEGATIVE mg/dL
Specific Gravity, Urine: 1.023 (ref 1.005–1.030)
pH: 7 (ref 5.0–8.0)

## 2017-04-18 LAB — HEPATIC FUNCTION PANEL
ALT: 31 U/L (ref 17–63)
AST: 29 U/L (ref 15–41)
Albumin: 3.5 g/dL (ref 3.5–5.0)
Alkaline Phosphatase: 57 U/L (ref 38–126)
Bilirubin, Direct: 0.2 mg/dL (ref 0.1–0.5)
Indirect Bilirubin: 0.6 mg/dL (ref 0.3–0.9)
Total Bilirubin: 0.8 mg/dL (ref 0.3–1.2)
Total Protein: 6.5 g/dL (ref 6.5–8.1)

## 2017-04-18 LAB — CBC
HEMATOCRIT: 39.8 % (ref 39.0–52.0)
HEMOGLOBIN: 12.7 g/dL — AB (ref 13.0–17.0)
MCH: 31.6 pg (ref 26.0–34.0)
MCHC: 31.9 g/dL (ref 30.0–36.0)
MCV: 99 fL (ref 78.0–100.0)
Platelets: 191 10*3/uL (ref 150–400)
RBC: 4.02 MIL/uL — ABNORMAL LOW (ref 4.22–5.81)
RDW: 13.1 % (ref 11.5–15.5)
WBC: 9.5 10*3/uL (ref 4.0–10.5)

## 2017-04-18 LAB — BASIC METABOLIC PANEL
ANION GAP: 8 (ref 5–15)
BUN: 9 mg/dL (ref 6–20)
CO2: 35 mmol/L — ABNORMAL HIGH (ref 22–32)
Calcium: 8.3 mg/dL — ABNORMAL LOW (ref 8.9–10.3)
Chloride: 101 mmol/L (ref 101–111)
Creatinine, Ser: 0.62 mg/dL (ref 0.61–1.24)
Glucose, Bld: 160 mg/dL — ABNORMAL HIGH (ref 65–99)
Potassium: 3.9 mmol/L (ref 3.5–5.1)
SODIUM: 144 mmol/L (ref 135–145)

## 2017-04-18 LAB — LIPASE, BLOOD: Lipase: 26 U/L (ref 11–51)

## 2017-04-18 MED ORDER — ONDANSETRON HCL 4 MG/2ML IJ SOLN
4.0000 mg | Freq: Once | INTRAMUSCULAR | Status: AC
  Administered 2017-04-18: 4 mg via INTRAVENOUS
  Filled 2017-04-18: qty 2

## 2017-04-18 MED ORDER — IOPAMIDOL (ISOVUE-300) INJECTION 61%
INTRAVENOUS | Status: AC
  Filled 2017-04-18: qty 100

## 2017-04-18 MED ORDER — ARIPIPRAZOLE 5 MG PO TABS
5.0000 mg | ORAL_TABLET | Freq: Every day | ORAL | Status: DC
  Administered 2017-04-19: 5 mg via ORAL
  Filled 2017-04-18: qty 1

## 2017-04-18 MED ORDER — IPRATROPIUM-ALBUTEROL 0.5-2.5 (3) MG/3ML IN SOLN
3.0000 mL | Freq: Once | RESPIRATORY_TRACT | Status: AC
  Administered 2017-04-18: 3 mL via RESPIRATORY_TRACT
  Filled 2017-04-18: qty 3

## 2017-04-18 MED ORDER — ALBUTEROL SULFATE (2.5 MG/3ML) 0.083% IN NEBU
5.0000 mg | INHALATION_SOLUTION | Freq: Once | RESPIRATORY_TRACT | Status: AC
  Administered 2017-04-18: 5 mg via RESPIRATORY_TRACT
  Filled 2017-04-18: qty 6

## 2017-04-18 MED ORDER — LEVALBUTEROL HCL 0.63 MG/3ML IN NEBU
0.6300 mg | INHALATION_SOLUTION | Freq: Four times a day (QID) | RESPIRATORY_TRACT | Status: DC | PRN
Start: 2017-04-18 — End: 2017-04-19
  Administered 2017-04-18: 0.63 mg via RESPIRATORY_TRACT
  Filled 2017-04-18: qty 3

## 2017-04-18 MED ORDER — IOPAMIDOL (ISOVUE-300) INJECTION 61%
100.0000 mL | Freq: Once | INTRAVENOUS | Status: AC | PRN
  Administered 2017-04-18: 100 mL via INTRAVENOUS

## 2017-04-18 MED ORDER — SODIUM CHLORIDE 0.9 % IV BOLUS
1000.0000 mL | Freq: Once | INTRAVENOUS | Status: AC
  Administered 2017-04-18: 1000 mL via INTRAVENOUS

## 2017-04-18 MED ORDER — ESCITALOPRAM OXALATE 20 MG PO TABS
20.0000 mg | ORAL_TABLET | Freq: Every day | ORAL | Status: DC
  Administered 2017-04-18 – 2017-04-19 (×2): 20 mg via ORAL
  Filled 2017-04-18 (×2): qty 1

## 2017-04-18 MED ORDER — ALBUTEROL SULFATE (2.5 MG/3ML) 0.083% IN NEBU: 2.5000 mg | INHALATION_SOLUTION | Freq: Four times a day (QID) | RESPIRATORY_TRACT | Status: DC

## 2017-04-18 MED ORDER — METOPROLOL TARTRATE 5 MG/5ML IV SOLN: 5.0000 mg | Freq: Four times a day (QID) | INTRAVENOUS | Status: DC | PRN

## 2017-04-18 MED ORDER — METHYLPREDNISOLONE SODIUM SUCC 125 MG IJ SOLR
125.0000 mg | Freq: Once | INTRAMUSCULAR | Status: AC
  Administered 2017-04-18: 125 mg via INTRAVENOUS
  Filled 2017-04-18: qty 2

## 2017-04-18 MED ORDER — PRAVASTATIN SODIUM 20 MG PO TABS
10.0000 mg | ORAL_TABLET | Freq: Every day | ORAL | Status: DC
  Administered 2017-04-18: 10 mg via ORAL
  Filled 2017-04-18: qty 1

## 2017-04-18 MED ORDER — PREDNISONE 10 MG PO TABS
10.0000 mg | ORAL_TABLET | Freq: Every day | ORAL | Status: DC
  Administered 2017-04-18 – 2017-04-19 (×2): 10 mg via ORAL
  Filled 2017-04-18 (×2): qty 1

## 2017-04-18 MED ORDER — ENOXAPARIN SODIUM 40 MG/0.4ML ~~LOC~~ SOLN
40.0000 mg | SUBCUTANEOUS | Status: DC
  Administered 2017-04-18: 40 mg via SUBCUTANEOUS
  Filled 2017-04-18 (×2): qty 0.4

## 2017-04-18 NOTE — Telephone Encounter (Signed)
Spoke with Levada Dy. She stated that the patient's PCP [name] could help to get the patients medication for free. She will send [name] a staff message today with my name attached to see if she can help the patient get his medication.

## 2017-04-18 NOTE — Telephone Encounter (Signed)
Spoke with Joycelyn Schmid at [employer]. She stated that the RX at the max would cost $10 for the patient. However, she wanted to know if the patient was on a maintenance inhaler because according to his chart, he is supposed to be Breo 100. She stated that if insurance was an issue, they could provide free Breo samples to the patient to help him out in addition to the $10 albuterol.   TP, please advise if you want him to be back on Breo 100. Will let the patient know once we receive an answer about the [facility] as well.

## 2017-04-18 NOTE — ED Notes (Signed)
ED TO INPATIENT HANDOFF REPORT  Name/Age/Gender Taylor [name] 62 y.o. male  Code Status Code Status History    Date Active Date Inactive Code Status Order ID Comments User Context   04/06/2017 0022 04/09/2017 2041 Full Code [id number]  Norval Morton, MD ED   03/28/2017 0252 03/28/2017 2203 Full Code [id number]  Shanon Rosser, MD ED   03/24/2017 0036 03/27/2017 1538 Full Code [id number]  Norval Morton, MD ED   10/22/2016 2241 10/24/2016 1901 DNR [id number]  Karmen Bongo, MD ED   01/09/2013 2341 01/22/2013 1959 Full Code [id number]  Toy Baker, MD Inpatient   02/03/2012 1704 02/06/2012 1626 Full Code [id number]  Stacie Glaze, RN ED      Home/SNF/Other Home  Chief Complaint COPD  Level of Care/Admitting Diagnosis ED Disposition    ED Disposition Condition [place]: [facility] [[id number]]  Level of Care: Med-Surg [16]  Diagnosis: COPD exacerbation [facility]) [[id number]]  Admitting Physician: Georgette Shell [[id number]]  Attending Physician: Georgette Shell [phone]  Estimated length of stay: past midnight tomorrow  Certification:: I certify this patient will need inpatient services for at least 2 midnights  PT Class (Do Not Modify): Inpatient [101]  PT Acc Code (Do Not Modify): Private [1]       Medical History Past Medical History:  Diagnosis Date  . Bipolar 1 disorder ([place]) 02/05/2012  . Cancer ([place]) 04/11/11   adenocarcinoma of colon, 7/19 nodes pos.FINISHED CHEMO/DR. SHERRILL  . COPD (chronic obstructive pulmonary disease) ([place])    SMOKER  . Depression    Bipolar disorder  . Full dentures   . Hemorrhoids   . Inguinal hernia    RIGHT- PAINFUL  . Prostate cancer ([place])    Gleason score = 7, supposed to have radiation therapy but he has not followed up  . Rib fractures    hx of  . Shortness of breath     Allergies Allergies  Allergen Reactions  . Codeine Nausea And Vomiting    IV  Location/Drains/Wounds Patient Lines/Drains/Airways Status   Active Line/Drains/Airways    Name:   Placement date:   Placement time:   Site:   Days:   Peripheral IV 04/18/17 Anterior;Right;Upper Arm   04/18/17    0737    Arm   less than 1          Labs/Imaging Results for orders placed or performed during the hospital encounter of 04/18/17 (from the past 48 hour(s))  Basic metabolic panel     Status: Abnormal   Collection Time: 04/18/17  2:03 AM  Result Value Ref Range   Sodium 144 135 - 145 mmol/L   Potassium 3.9 3.5 - 5.1 mmol/L   Chloride 101 101 - 111 mmol/L   CO2 35 (H) 22 - 32 mmol/L   Glucose, Bld 160 (H) 65 - 99 mg/dL   BUN 9 6 - 20 mg/dL   Creatinine, Ser 0.62 0.61 - 1.24 mg/dL   Calcium 8.3 (L) 8.9 - 10.3 mg/dL   GFR calc non Af Amer >60 >60 mL/min   GFR calc Af Amer >60 >60 mL/min    Comment: (NOTE) The eGFR has been calculated using the CKD EPI equation. This calculation has not been validated in all clinical situations. eGFR's persistently <60 mL/min signify possible Chronic Kidney Disease.    Anion gap 8 5 - 15    Comment: Performed at [facility], [place] [address] [place], [place] [address]  CBC     Status: Abnormal   Collection Time: 04/18/17  2:03 AM  Result Value Ref Range   WBC 9.5 4.0 - 10.5 K/uL   RBC 4.02 (L) 4.22 - 5.81 MIL/uL   Hemoglobin 12.7 (L) 13.0 - 17.0 g/dL   HCT 39.8 39.0 - 52.0 %   MCV 99.0 78.0 - 100.0 fL   MCH 31.6 26.0 - 34.0 pg   MCHC 31.9 30.0 - 36.0 g/dL   RDW 13.1 11.5 - 15.5 %   Platelets 191 150 - 400 K/uL    Comment: Performed at [facility], [place] [address], [place], [place] [address]  Hepatic function panel     Status: None   Collection Time: 04/18/17  6:38 AM  Result Value Ref Range   Total Protein 6.5 6.5 - 8.1 g/dL   Albumin 3.5 3.5 - 5.0 g/dL   AST 29 15 - 41 U/L   ALT 31 17 - 63 U/L   Alkaline Phosphatase 57 38 - 126 U/L   Total Bilirubin 0.8 0.3 - 1.2 mg/dL   Bilirubin,  Direct 0.2 0.1 - 0.5 mg/dL   Indirect Bilirubin 0.6 0.3 - 0.9 mg/dL    Comment: Performed at [facility], [place] [address] [place], [place] [address]  Lipase, blood     Status: None   Collection Time: 04/18/17  6:38 AM  Result Value Ref Range   Lipase 26 11 - 51 U/L    Comment: Performed at [facility], [place] [address] [place], [place] [address]  Urinalysis, Routine w reflex microscopic     Status: Abnormal   Collection Time: 04/18/17  8:09 AM  Result Value Ref Range   Color, Urine YELLOW YELLOW   APPearance CLEAR CLEAR   Specific Gravity, Urine 1.023 1.005 - 1.030   pH 7.0 5.0 - 8.0   Glucose, UA >=500 (A) NEGATIVE mg/dL   Hgb urine dipstick NEGATIVE NEGATIVE   Bilirubin Urine NEGATIVE NEGATIVE   Ketones, ur 5 (A) NEGATIVE mg/dL   Protein, ur NEGATIVE NEGATIVE mg/dL   Nitrite NEGATIVE NEGATIVE   Leukocytes, UA NEGATIVE NEGATIVE   RBC / HPF 0-5 0 - 5 RBC/hpf   WBC, UA 0-5 0 - 5 WBC/hpf   Bacteria, UA RARE (A) NONE SEEN   Squamous Epithelial / LPF 0-5 (A) NONE SEEN   Mucus PRESENT     Comment: Performed at [facility], [place] [address] [place], [place] [address]   Dg Chest 2 View  Result Date: 04/18/2017 CLINICAL DATA:  62 year old male with shortness of breath and COPD. EXAM: CHEST - 2 VIEW COMPARISON:  Chest radiograph dated 04/05/2017 FINDINGS: The lungs are clear. There is no pleural effusion or pneumothorax. There is hyperexpansion of the lungs with flattening of the diaphragms, likely related to air trapping and COPD. The cardiac silhouette is within normal limits. Old healed right posterior rib fractures. No acute osseous pathology. IMPRESSION: No active cardiopulmonary disease. Electronically Signed   By: Anner Crete M.D.   On: 04/18/2017 06:29   Ct Abdomen Pelvis W Contrast  Result Date: 04/18/2017 CLINICAL DATA:  Lower abdominal pain. Previous history of colon cancer post sigmoid colectomy September 2018. EXAM:  CT ABDOMEN AND PELVIS WITH CONTRAST TECHNIQUE: Multidetector CT imaging of the abdomen and pelvis was performed using the standard protocol following bolus administration of intravenous contrast. CONTRAST:  118m ISOVUE-300 IOPAMIDOL (ISOVUE-300) INJECTION 61% COMPARISON:  10/18/2016 and 05/22/2016 as well as 04/15/2012 FINDINGS: Lower chest:  There is a 6 x 9 mm irregular nodule over the posterior left lower lobe. There are 3 other smaller subcentimeter nodules over the left lower lobe. These nodules are new. Right base is normal. No effusion. Hepatobiliary: Normal. Pancreas: Normal. Spleen: Normal. Adrenals/Urinary Tract: Adrenal glands are normal. Kidneys normal size without hydronephrosis or nephrolithiasis. 1 cm hypodensity over the mid to lower pole right kidney as well as 1 cm hypodensity over the mid to lower pole left kidney compatible with small cysts. Ureters and bladder are normal. Stomach/Bowel: Stomach and small bowel normal. Appendix is normal. Colon is unremarkable. Vascular/Lymphatic: Minimal calcified plaque over the abdominal aorta and iliac arteries. No adenopathy. Reproductive: Normal. Other: No free fluid or focal inflammatory change. Musculoskeletal: Degenerative change of the spine. Subtle anterior wedging of T12 which Corrales node over the superior endplate unchanged. Schmorl's node over the superior endplate of Z61. IMPRESSION: No acute findings in the abdomen/pelvis. 9 x 6 mm irregular nodule over the left lower lobe with a few smaller subcentimeter nodules in the left lower lobe which are new. Findings may be due to metastatic disease in this patient with known colon cancer, although may represent an infectious or inflammatory process. Recommend follow-up CT 4-6 weeks. Small bilateral renal cysts. Aortic Atherosclerosis (ICD10-I70.0). Electronically Signed   By: Marin Olp M.D.   On: 04/18/2017 07:56    Pending Labs Unresulted Labs (From admission, onward)   None       Vitals/Pain Today's Vitals   04/18/17 0855 04/18/17 0900 04/18/17 0930 04/18/17 1000  BP:  132/79 120/79 120/77  Pulse: 93 83 85 84  Resp: 18 (!) 27 (!) 26 (!) 21  Temp:      TempSrc:      SpO2: 97% 100% 96% 96%  Weight:      Height:      PainSc:        Isolation Precautions No active isolations  Medications Medications  iopamidol (ISOVUE-300) 61 % injection (has no administration in time range)  ipratropium-albuterol (DUONEB) 0.5-2.5 (3) MG/3ML nebulizer solution 3 mL (3 mLs Nebulization Given 04/18/17 0651)  sodium chloride 0.9 % bolus 1,000 mL (0 mLs Intravenous Stopped 04/18/17 0853)  ondansetron (ZOFRAN) injection 4 mg (4 mg Intravenous Given 04/18/17 0633)  iopamidol (ISOVUE-300) 61 % injection 100 mL (100 mLs Intravenous Contrast Given 04/18/17 0702)  methylPREDNISolone sodium succinate (SOLU-MEDROL) 125 mg/2 mL injection 125 mg (125 mg Intravenous Given 04/18/17 0858)  ipratropium-albuterol (DUONEB) 0.5-2.5 (3) MG/3ML nebulizer solution 3 mL (3 mLs Nebulization Given 04/18/17 0855)  albuterol (PROVENTIL) (2.5 MG/3ML) 0.083% nebulizer solution 5 mg (5 mg Nebulization Given 04/18/17 1051)    Mobility walks

## 2017-04-18 NOTE — H&P (Signed)
History and Physical    Taylor [name] ZOX:[id number] DOB: 08-28-1955 DOA: 04/18/2017  PCP: [name], NP Patient coming from: Home  Chief Complaint: Shortness of breath  HPI: Taylor [name] is a 62 y.o. male with medical history significant for chronic hypoxic respiratory failure on home oxygen 2 L, bipolar disorder, history of adenocarcinoma of the colon status post treatment, prostate cancer, depression, tobacco use admitted with complaints of increasing shortness of breath.  Patient was recently discharged from the hospital 04/09/2017.  It looks like he had difficulty filling his prescriptions.  Patient lives in a boarding home with 4 other people.  And he was supposed to follow-up with the clinic [facility] and get his prescriptions.  Patient reported whenever he went there there was nobody there.  I have read through case management notes his PCP is not there on Fridays and the clinic is closed after 3 PM every day.  Patient apparently went to the clinic after lunch time or on Fridays. .  However case management has been contacted at this admission to arrange for prescriptions to be given or after discharge.  Patient does have a pulmonary follow-up made in 6 weeks.  However patient was ambulated in the ER and his oxygen saturation dropped to 89% on 2 L.  He denies any fever chills cough at this time.  He said he quit smoking a year ago.  No nausea vomiting diarrhea constipation fever or chills.  He is asking for food and lunch at this time.    ED Course: Patient received nebulizer treatments and steroids and doxycycline.  His chest x-ray was clear.  Labs sodium 144 potassium 3.9 BUN 9 creatinine 0.62, white count was 9.5 hemoglobin 12.7 platelet count 191.  For some reason he had a CT of the abdomen and pelvis that showed 9 x 6 mm irregular nodule over the left lower lobe with a few smaller subcentimeter nodules in the left lower lobe which are new. Findings may be due to metastatic  disease in this patient with known colon cancer, although may represent an infectious or inflammatory process. Recommend follow-up CT 4-6 weeks.  Small bilateral renal cysts.  Aortic Atherosclerosis (ICD10-I70.0).   Review of Systems: As per HPI otherwise all other systems reviewed and are negative  Past Medical History:  Diagnosis Date  . Bipolar 1 disorder ([place]) 02/05/2012  . Cancer ([place]) 04/11/11   adenocarcinoma of colon, 7/19 nodes pos.FINISHED CHEMO/DR. SHERRILL  . COPD (chronic obstructive pulmonary disease) ([place])    SMOKER  . Depression    Bipolar disorder  . Full dentures   . Hemorrhoids   . Inguinal hernia    RIGHT- PAINFUL  . Prostate cancer ([place])    Gleason score = 7, supposed to have radiation therapy but he has not followed up  . Rib fractures    hx of  . Shortness of breath     Past Surgical History:  Procedure Laterality Date  . COLON SURGERY  04/11/11   Sigmoid colectomy  . COLOSTOMY REVISION  04/11/2011   Procedure: COLON RESECTION SIGMOID;  Surgeon: [name], MD;  Location: [facility];  Service: General;  Laterality: N/A;  low anterior colon resection   . INGUINAL HERNIA REPAIR Right 05/01/2012   Procedure: HERNIA REPAIR INGUINAL ADULT;  Surgeon: [name], MD;  Location: [facility];  Service: General;  Laterality: Right;  . INSERTION OF MESH Right 05/01/2012   Procedure: INSERTION OF MESH;  Surgeon: [name], MD;  Location: [facility];  Service: General;  Laterality: Right;  . PORT-A-CATH REMOVAL Left 05/01/2012   Procedure: REMOVAL Infusion Port;  Surgeon: [name], MD;  Location: [facility];  Service: General;  Laterality: Left;  . PORTACATH PLACEMENT    . PORTACATH PLACEMENT  05/02/2011   Procedure: INSERTION PORT-A-CATH;  Surgeon: [name], MD;  Location: [facility];  Service: General;  Laterality: N/A;    Social History   Socioeconomic History  . Marital status: Divorced    Spouse name: Not on file  . Number of children: Not on file  .  Years of education: Not on file  . Highest education level: Not on file  Occupational History  . Occupation: disability pending  Social Needs  . Financial resource strain: Not on file  . Food insecurity:    Worry: Not on file    Inability: Not on file  . Transportation needs:    Medical: Not on file    Non-medical: Not on file  Tobacco Use  . Smoking status: Former Smoker    Packs/day: 1.00    Years: 45.00    Pack years: 45.00    Types: Cigarettes    Last attempt to quit: 01/22/2016    Years since quitting: 1.2  . Smokeless tobacco: Former Network engineer and Sexual Activity  . Alcohol use: No    Comment: h/o use in the past, no h/o heavy use  . Drug use: No  . Sexual activity: Never  Lifestyle  . Physical activity:    Days per week: Not on file    Minutes per session: Not on file  . Stress: Not on file  Relationships  . Social connections:    Talks on phone: Not on file    Gets together: Not on file    Attends religious service: Not on file    Active member of club or organization: Not on file    Attends meetings of clubs or organizations: Not on file    Relationship status: Not on file  . Intimate partner violence:    Fear of current or ex partner: Not on file    Emotionally abused: Not on file    Physically abused: Not on file    Forced sexual activity: Not on file  Other Topics Concern  . Not on file  Social History Narrative   Lives alone-divorced, disabled, in [place]'s   Brother, Taylor [name] and his wife Taylor [name] assist   Prior occupation: Clinical biochemist    Allergies  Allergen Reactions  . Codeine Nausea And Vomiting    Family History  Problem Relation Age of Onset  . Heart disease Father   . Lung cancer Maternal Uncle        smoked    Prior to Admission medications   Medication Sig Start Date End Date Taking? Authorizing Provider  albuterol (PROVENTIL HFA;VENTOLIN HFA) 108 (90 Base) MCG/ACT inhaler Inhale 1-2 puffs into the lungs every 6 (six)  hours as needed for wheezing or shortness of breath. 04/09/17  Yes Hongalgi, Lenis Dickinson, MD  albuterol (PROVENTIL) (2.5 MG/3ML) 0.083% nebulizer solution Take 3 mLs (2.5 mg total) by nebulization 4 (four) times daily. Dx: J44.9 04/15/17  Yes Parrett, Tammy S, NP  ARIPiprazole (ABILIFY) 5 MG tablet Take 1 tablet (5 mg total) by mouth every morning. 03/27/17  Yes Shahmehdi, Seyed A, MD  escitalopram (LEXAPRO) 20 MG tablet Take 1 tablet (20 mg total) by mouth every morning. 03/27/17  Yes Shahmehdi, Seyed A,  MD  lovastatin (MEVACOR) 20 MG tablet Take 1 tablet (20 mg total) by mouth at bedtime. 04/09/17  Yes Hongalgi, Lenis Dickinson, MD  OXYGEN Inhale 2 L into the lungs.   Yes [provider]  ipratropium (ATROVENT) 0.02 % nebulizer solution Take 2.5 mLs (0.5 mg total) by nebulization 4 (four) times daily. Dx: J44.9 Patient not taking: Reported on 04/18/2017 04/15/17   Parrett, Fonnie Mu, NP  predniSONE (DELTASONE) 10 MG tablet Take 4 tabs daily for 3 days, then 3 tabs daily for 3 days, then 2 tabs daily for 3 days, then 1 tab daily for 3 days, then stop. 04/10/17   Modena Jansky, MD    Physical Exam: Vitals:   04/18/17 0900 04/18/17 0930 04/18/17 1000 04/18/17 1311  BP: 132/79 120/79 120/77 127/75  Pulse: 83 85 84 (!) 121  Resp: (!) 27 (!) 26 (!) 21 (!) 24  Temp:    98.9 F (37.2 C)  TempSrc:    Oral  SpO2: 100% 96% 96% 94%  Weight:      Height:         General: Appears calm and comfortable Eyes:  PERRL, EOMI, normal lids ENT:  grossly normal hearing, lips & tongue, mmm Neck:  no LAD, masses or thyromegaly Cardiovascular:  RRR, no m/r/g. No LE edema.  Respiratory: Few wheezing bilaterally, no w/r/r. Normal respiratory effort. Abdomen: soft, ntnd, NABS Skin: no rash or induration seen on limited exam Musculoskeletal:  grossly normal tone BUE/BLE, good ROM, no bony abnormality Psychiatric: grossly normal mood and affect, speech fluent and appropriate, AOx3 Neurologic:  CN 2-12 grossly intact,  moves all extremities in coordinated fashion, sensation intact  Labs on Admission: I have personally reviewed following labs and imaging studies  CBC: Recent Labs  Lab 04/18/17 0203  WBC 9.5  HGB 12.7*  HCT 39.8  MCV 99.0  PLT 160   Basic Metabolic Panel: Recent Labs  Lab 04/18/17 0203  NA 144  K 3.9  CL 101  CO2 35*  GLUCOSE 160*  BUN 9  CREATININE 0.62  CALCIUM 8.3*   GFR: Estimated Creatinine Clearance: 101.3 mL/min (by C-G formula based on SCr of 0.62 mg/dL). Liver Function Tests: Recent Labs  Lab 04/18/17 0638  AST 29  ALT 31  ALKPHOS 57  BILITOT 0.8  PROT 6.5  ALBUMIN 3.5   Recent Labs  Lab 04/18/17 0638  LIPASE 26   No results for input(s): AMMONIA in the last 168 hours. Coagulation Profile: No results for input(s): INR, PROTIME in the last 168 hours. Cardiac Enzymes: No results for input(s): CKTOTAL, CKMB, CKMBINDEX, TROPONINI in the last 168 hours. BNP (last 3 results) No results for input(s): PROBNP in the last 8760 hours. HbA1C: No results for input(s): HGBA1C in the last 72 hours. CBG: No results for input(s): GLUCAP in the last 168 hours. Lipid Profile: No results for input(s): CHOL, HDL, LDLCALC, TRIG, CHOLHDL, LDLDIRECT in the last 72 hours. Thyroid Function Tests: No results for input(s): TSH, T4TOTAL, FREET4, T3FREE, THYROIDAB in the last 72 hours. Anemia Panel: No results for input(s): VITAMINB12, FOLATE, FERRITIN, TIBC, IRON, RETICCTPCT in the last 72 hours. Urine analysis:    Component Value Date/Time   COLORURINE YELLOW 04/18/2017 0809   APPEARANCEUR CLEAR 04/18/2017 0809   LABSPEC 1.023 04/18/2017 0809   PHURINE 7.0 04/18/2017 0809   GLUCOSEU >=500 (A) 04/18/2017 0809   HGBUR NEGATIVE 04/18/2017 0809   BILIRUBINUR NEGATIVE 04/18/2017 0809   KETONESUR 5 (A) 04/18/2017 0809   PROTEINUR  NEGATIVE 04/18/2017 0809   UROBILINOGEN 1.0 01/09/2013 1843   NITRITE NEGATIVE 04/18/2017 0809   LEUKOCYTESUR NEGATIVE 04/18/2017 0809     Creatinine Clearance: Estimated Creatinine Clearance: 101.3 mL/min (by C-G formula based on SCr of 0.62 mg/dL).  Sepsis Labs: @LABRCNTIP (procalcitonin:4,lacticidven:4) )No results found for this or any previous visit (from the past 240 hour(s)).   Radiological Exams on Admission: Dg Chest 2 View  Result Date: 04/18/2017 CLINICAL DATA:  62 year old male with shortness of breath and COPD. EXAM: CHEST - 2 VIEW COMPARISON:  Chest radiograph dated 04/05/2017 FINDINGS: The lungs are clear. There is no pleural effusion or pneumothorax. There is hyperexpansion of the lungs with flattening of the diaphragms, likely related to air trapping and COPD. The cardiac silhouette is within normal limits. Old healed right posterior rib fractures. No acute osseous pathology. IMPRESSION: No active cardiopulmonary disease. Electronically Signed   By: Anner Crete M.D.   On: 04/18/2017 06:29   Ct Abdomen Pelvis W Contrast  Result Date: 04/18/2017 CLINICAL DATA:  Lower abdominal pain. Previous history of colon cancer post sigmoid colectomy September 2018. EXAM: CT ABDOMEN AND PELVIS WITH CONTRAST TECHNIQUE: Multidetector CT imaging of the abdomen and pelvis was performed using the standard protocol following bolus administration of intravenous contrast. CONTRAST:  121mL ISOVUE-300 IOPAMIDOL (ISOVUE-300) INJECTION 61% COMPARISON:  10/18/2016 and 05/22/2016 as well as 04/15/2012 FINDINGS: Lower chest: There is a 6 x 9 mm irregular nodule over the posterior left lower lobe. There are 3 other smaller subcentimeter nodules over the left lower lobe. These nodules are new. Right base is normal. No effusion. Hepatobiliary: Normal. Pancreas: Normal. Spleen: Normal. Adrenals/Urinary Tract: Adrenal glands are normal. Kidneys normal size without hydronephrosis or nephrolithiasis. 1 cm hypodensity over the mid to lower pole right kidney as well as 1 cm hypodensity over the mid to lower pole left kidney compatible with small  cysts. Ureters and bladder are normal. Stomach/Bowel: Stomach and small bowel normal. Appendix is normal. Colon is unremarkable. Vascular/Lymphatic: Minimal calcified plaque over the abdominal aorta and iliac arteries. No adenopathy. Reproductive: Normal. Other: No free fluid or focal inflammatory change. Musculoskeletal: Degenerative change of the spine. Subtle anterior wedging of T12 which Corrales node over the superior endplate unchanged. Schmorl's node over the superior endplate of I78. IMPRESSION: No acute findings in the abdomen/pelvis. 9 x 6 mm irregular nodule over the left lower lobe with a few smaller subcentimeter nodules in the left lower lobe which are new. Findings may be due to metastatic disease in this patient with known colon cancer, although may represent an infectious or inflammatory process. Recommend follow-up CT 4-6 weeks. Small bilateral renal cysts. Aortic Atherosclerosis (ICD10-I70.0). Electronically Signed   By: Marin Olp M.D.   On: 04/18/2017 07:56    EKG: Independently reviewed. Assessment/Plan Active Problems:   COPD exacerbation (HCC)   1] acute on chronic hypoxic respiratory failure due to COPD exacerbation mild-patient presented with complaints of shortness of breath after not being able to fill his outpatient medication since the day of discharge on 04/10/2017.  Patient became hypoxic upon ambulation in the ER.  He has an appointment follow-up with pulmonary.  I will treated with nebulizers and prednisone at this time I will hold off on IV steroids as he is not wheezing significant at this time.  At the time of discharge patient will need arrangements made for prescriptions outpatient.  His chest x-ray was clear I will not be starting him on any antibiotics.  The CT scan done in the  emergency room showed 9 x 6 mm irregular nodule in the left lower lobe with a few smaller subcentimeter nodules in the left lower lobe which are new.  Findings may be secondary to metastatic  disease.  A follow-up CT recommended in 4-6 weeks.  2] bipolar disorder continue Abilify and Lexapro.  Follow-up with [facility] behavioral [facility].  3] history of adenocarcinoma of the colon patient was due to get follow-up appointment with [facility] but has not done so yet.  4] prostate cancer does not have a urologist   DVT prophylaxis: Lovenox  Code Status: Full code Family Communication: No family available Disposition Plan: Patient should be able to discharge in the next 24 hours as soon as outpatient medications can be arranged. Consults called: None Admission status: Observation  Georgette Shell MD [facility]  If 7PM-7AM, please contact night-coverage [email or web address] Password [facility]  04/18/2017, 1:41 PM

## 2017-04-18 NOTE — ED Notes (Signed)
Bed: AY04 Expected date:  Expected time:  Means of arrival:  Comments: 62 yo M  Shortness of breath

## 2017-04-18 NOTE — Care Management Note (Signed)
Case Management Note  CM spoke with pt after chart review and advised pt he needs to follow up with [name], NP at the [facility].  He stated he went by the [facility] to pick up his medications but there was never anyone there.  CM reminded him that [name] is not there on Friday's (one of the days he went) and that they close at 3 pm (he went the other day after "lunch").  CM also asked about his [place] appointment required to establish VA benefits that notes show he missed.  He reports that he has it rescheduled for July.  CM advised him that [facility] team may be able to help get him to his appointment if he's having difficulty getting transportation.  Pt acknowledged understanding of follow up information and that it would be on his D/C paperwork after his hospital admission.  CM additionally contacted [facility] office to speak with the [place] who is working on getting pt's medications.  CM advised her to contact [name], NP at the [facility] to help the pt get his meds.  CM will send a message to [name] to advise her of his readmission, need for transportation to appointment in July, and medication needs for future D/C and pulmonology office.  No further CM needs noted at this time.

## 2017-04-18 NOTE — ED Provider Notes (Signed)
[facility] Provider Note   CSN: [id number] Arrival date & time: 04/18/17  0129     History   Chief Complaint Chief Complaint  Patient presents with  . COPD    HPI Taylor [name] is a 62 y.o. male with history of COPD, hemorrhoids, bipolar 1 disorder, prostate and colon cancer presents today for evaluation of shortness of breath for 1 week.  He was seen and evaluated for his COPD exacerbation on 04/10/17 and was found to be stable for discharge home with a prescription for prednisone but was unable to fill this prescription due to financial constraints.  He notes ongoing shortness of breath specifically with exertion.  He notes orthopnea.  He also notes intermittent "hot flashes "in which he becomes warm and diaphoretic.  He notes constant chest pressure but no chest pain.  He denies leg swelling.  He does have an albuterol inhaler at home and has been using it at least 3 times daily.  Denies fevers, cough, hemoptysis.  He is on 2 L via nasal cannula at home 24/7 but has noted that occasionally he has to increase it to at least 3 L via nasal cannula.  He is a non-smoker at this time.  He also notes 1 month of intermittent sharp stabbing generalized abdominal pain with multiple episodes of watery stools.  He does note that it is dark in color but denies any frank bleeding.  He notes nausea but no vomiting.  He does note night sweats but denies unintentional weight loss.  He has a history of colon cancer status post sigmoid colectomy in 2013 and has not followed up with someone for this in quite some time.  The history is provided by the patient.    Past Medical History:  Diagnosis Date  . Bipolar 1 disorder ([place]) 02/05/2012  . Cancer ([place]) 04/11/11   adenocarcinoma of colon, 7/19 nodes pos.FINISHED CHEMO/DR. SHERRILL  . COPD (chronic obstructive pulmonary disease) ([place])    SMOKER  . Depression    Bipolar disorder  . Full dentures   .  Hemorrhoids   . Inguinal hernia    RIGHT- PAINFUL  . Prostate cancer ([place])    Gleason score = 7, supposed to have radiation therapy but he has not followed up  . Rib fractures    hx of  . Shortness of breath     Patient Active Problem List   Diagnosis Date Noted  . Acute on chronic respiratory failure with hypoxia and hypercapnia ([place]) 04/06/2017  . Leukocytosis 04/06/2017  . Adjustment disorder with depressed mood 03/28/2017  . Adjustment disorder with mixed disturbance of emotions and conduct   . Chronic respiratory failure with hypoxia ([place]) 03/24/2017  . Normocytic normochromic anemia 03/24/2017  . COPD with acute exacerbation ([place]) 10/22/2016  . Acute bronchitis due to human metapneumovirus 01/16/2013  . Alcohol abuse 01/09/2013  . COPD exacerbation ([place]) 01/09/2013  . Chest pain 01/09/2013  . Acute respiratory failure with hypoxia ([place]) 01/09/2013  . Smoker 12/16/2012  . Inguinal hernia unilateral, non-recurrent, right 05/01/2012  . Dyspepsia 02/05/2012  . Hyperglycemia 02/04/2012  . COPD  GOLD III 02/03/2012  . Thrombocytopenia ([place]) 02/03/2012  . Depression   . 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 COLON SURGERY  04/11/11   Sigmoid colectomy  . COLOSTOMY REVISION  04/11/2011   Procedure: COLON RESECTION SIGMOID;  Surgeon: [name], MD;  Location: [facility];  Service: General;  Laterality: N/A;  low anterior colon resection   . INGUINAL HERNIA REPAIR Right 05/01/2012   Procedure: HERNIA REPAIR INGUINAL ADULT;  Surgeon: [name], MD;  Location: [facility];  Service: General;  Laterality: Right;  . INSERTION OF MESH Right 05/01/2012   Procedure: INSERTION OF MESH;  Surgeon: [name], MD;  Location: [facility];  Service: General;  Laterality: Right;  . PORT-A-CATH REMOVAL Left 05/01/2012   Procedure: REMOVAL Infusion Port;  Surgeon: [name], MD;  Location: [facility];  Service: General;  Laterality: Left;  .  PORTACATH PLACEMENT    . PORTACATH PLACEMENT  05/02/2011   Procedure: INSERTION PORT-A-CATH;  Surgeon: [name], MD;  Location: [facility];  Service: General;  Laterality: N/A;        Home Medications    Prior to Admission medications   Medication Sig Start Date End Date Taking? Authorizing Provider  albuterol (PROVENTIL HFA;VENTOLIN HFA) 108 (90 Base) MCG/ACT inhaler Inhale 1-2 puffs into the lungs every 6 (six) hours as needed for wheezing or shortness of breath. 04/09/17  Yes Hongalgi, Lenis Dickinson, MD  albuterol (PROVENTIL) (2.5 MG/3ML) 0.083% nebulizer solution Take 3 mLs (2.5 mg total) by nebulization 4 (four) times daily. Dx: J44.9 04/15/17  Yes Parrett, Tammy S, NP  ARIPiprazole (ABILIFY) 5 MG tablet Take 1 tablet (5 mg total) by mouth every morning. 03/27/17  Yes Shahmehdi, Seyed A, MD  escitalopram (LEXAPRO) 20 MG tablet Take 1 tablet (20 mg total) by mouth every morning. 03/27/17  Yes Shahmehdi, Seyed A, MD  lovastatin (MEVACOR) 20 MG tablet Take 1 tablet (20 mg total) by mouth at bedtime. 04/09/17  Yes Hongalgi, Lenis Dickinson, MD  OXYGEN Inhale 2 L into the lungs.   Yes [provider]  ipratropium (ATROVENT) 0.02 % nebulizer solution Take 2.5 mLs (0.5 mg total) by nebulization 4 (four) times daily. Dx: J44.9 Patient not taking: Reported on 04/18/2017 04/15/17   Parrett, Fonnie Mu, NP  predniSONE (DELTASONE) 10 MG tablet Take 4 tabs daily for 3 days, then 3 tabs daily for 3 days, then 2 tabs daily for 3 days, then 1 tab daily for 3 days, then stop. 04/10/17   Hongalgi, Lenis Dickinson, MD    Family History Family History  Problem Relation Age of Onset  . Heart disease Father   . Lung cancer Maternal Uncle        smoked    Social History Social History   Tobacco Use  . Smoking status: Former Smoker    Packs/day: 1.00    Years: 45.00    Pack years: 45.00    Types: Cigarettes    Last attempt to quit: 01/22/2016    Years since quitting: 1.2  . Smokeless tobacco: Former Network engineer Use  Topics  . Alcohol use: No    Comment: h/o use in the past, no h/o heavy use  . Drug use: No     Allergies   Codeine   Review of Systems Review of Systems  Constitutional: Positive for diaphoresis and fatigue. Negative for fever.  Respiratory: Positive for chest tightness and shortness of breath. Negative for cough.   Cardiovascular: Negative for chest pain and leg swelling.  Gastrointestinal: Positive for abdominal pain, blood in stool (possible), diarrhea and nausea. Negative for constipation and vomiting.  All other systems reviewed and are negative.    Physical Exam Updated Vital Signs BP 127/75 (BP Location: Left Arm)   Pulse (!) 121  Temp 98.9 F (37.2 C) (Oral)   Resp (!) 24   Ht 5\' 11"  (1.803 m)   Wt 74.8 kg (165 lb)   SpO2 94%   BMI 23.01 kg/m   Physical Exam  Constitutional: He appears well-developed and well-nourished. No distress.  HENT:  Head: Normocephalic and atraumatic.  Eyes: Conjunctivae are normal. Right eye exhibits no discharge. Left eye exhibits no discharge.  Neck: Normal range of motion. Neck supple. No JVD present. No tracheal deviation present.  Cardiovascular: Normal rate, regular rhythm, normal heart sounds and intact distal pulses.  2+ radial and DP/PT pulses bl, negative Homan's bl, no lower extremity edema  Pulmonary/Chest: He exhibits no tenderness.  Globally diminished breath sounds, equal rise and fall of chest.  He is on 2 L via nasal cannula, speaking in full sentences.  Mildly tachypneic.  Scattered bibasilar crackles.  Abdominal: Soft. He exhibits no distension and no mass. There is tenderness. There is no guarding.  Diminished bowel sounds.  Generalized tenderness to palpation with no focal tenderness.  Murphy sign absent, Rovsing's absent, no CVA tenderness.  Abdomen is soft but mildly distended  Musculoskeletal: He exhibits no edema.  Neurological: He is alert.  Skin: Skin is warm and dry. No erythema.  Psychiatric: He has a  normal mood and affect. His behavior is normal.  Nursing note and vitals reviewed.    ED Treatments / Results  Labs (all labs ordered are listed, but only abnormal results are displayed) Labs Reviewed  BASIC METABOLIC PANEL - Abnormal; Notable for the following components:      Result Value   CO2 35 (*)    Glucose, Bld 160 (*)    Calcium 8.3 (*)    All other components within normal limits  CBC - Abnormal; Notable for the following components:   RBC 4.02 (*)    Hemoglobin 12.7 (*)    All other components within normal limits  URINALYSIS, ROUTINE W REFLEX MICROSCOPIC - Abnormal; Notable for the following components:   Glucose, UA >=500 (*)    Ketones, ur 5 (*)    Bacteria, UA RARE (*)    Squamous Epithelial / LPF 0-5 (*)    All other components within normal limits  HEPATIC FUNCTION PANEL  LIPASE, BLOOD  I-STAT TROPONIN, ED  POC OCCULT BLOOD, ED    EKG EKG Interpretation  Date/Time:  Friday April 18 2017 01:50:51 EDT Ventricular Rate:  92 PR Interval:    QRS Duration: 89 QT Interval:  346 QTC Calculation: 428 R Axis:   59 Text Interpretation:  Sinus rhythm Borderline short PR interval No significant change was found Confirmed by Shanon Rosser [id number]) on 04/18/2017 1:54:01 AM Also confirmed by Shanon Rosser [id number]), editor Hattie Perch [phone])  on 04/18/2017 7:21:33 AM   Radiology Dg Chest 2 View  Result Date: 04/18/2017 CLINICAL DATA:  62 year old male with shortness of breath and COPD. EXAM: CHEST - 2 VIEW COMPARISON:  Chest radiograph dated 04/05/2017 FINDINGS: The lungs are clear. There is no pleural effusion or pneumothorax. There is hyperexpansion of the lungs with flattening of the diaphragms, likely related to air trapping and COPD. The cardiac silhouette is within normal limits. Old healed right posterior rib fractures. No acute osseous pathology. IMPRESSION: No active cardiopulmonary disease. Electronically Signed   By: Anner Crete M.D.   On: 04/18/2017  06:29   Ct Abdomen Pelvis W Contrast  Result Date: 04/18/2017 CLINICAL DATA:  Lower abdominal pain. Previous history of colon cancer post sigmoid colectomy September  2018. EXAM: CT ABDOMEN AND PELVIS WITH CONTRAST TECHNIQUE: Multidetector CT imaging of the abdomen and pelvis was performed using the standard protocol following bolus administration of intravenous contrast. CONTRAST:  155mL ISOVUE-300 IOPAMIDOL (ISOVUE-300) INJECTION 61% COMPARISON:  10/18/2016 and 05/22/2016 as well as 04/15/2012 FINDINGS: Lower chest: There is a 6 x 9 mm irregular nodule over the posterior left lower lobe. There are 3 other smaller subcentimeter nodules over the left lower lobe. These nodules are new. Right base is normal. No effusion. Hepatobiliary: Normal. Pancreas: Normal. Spleen: Normal. Adrenals/Urinary Tract: Adrenal glands are normal. Kidneys normal size without hydronephrosis or nephrolithiasis. 1 cm hypodensity over the mid to lower pole right kidney as well as 1 cm hypodensity over the mid to lower pole left kidney compatible with small cysts. Ureters and bladder are normal. Stomach/Bowel: Stomach and small bowel normal. Appendix is normal. Colon is unremarkable. Vascular/Lymphatic: Minimal calcified plaque over the abdominal aorta and iliac arteries. No adenopathy. Reproductive: Normal. Other: No free fluid or focal inflammatory change. Musculoskeletal: Degenerative change of the spine. Subtle anterior wedging of T12 which Corrales node over the superior endplate unchanged. Schmorl's node over the superior endplate of W29. IMPRESSION: No acute findings in the abdomen/pelvis. 9 x 6 mm irregular nodule over the left lower lobe with a few smaller subcentimeter nodules in the left lower lobe which are new. Findings may be due to metastatic disease in this patient with known colon cancer, although may represent an infectious or inflammatory process. Recommend follow-up CT 4-6 weeks. Small bilateral renal cysts. Aortic  Atherosclerosis (ICD10-I70.0). Electronically Signed   By: Marin Olp M.D.   On: 04/18/2017 07:56    Procedures Procedures (including critical care time)  Medications Ordered in ED Medications  iopamidol (ISOVUE-300) 61 % injection (has no administration in time range)  enoxaparin (LOVENOX) injection 40 mg (has no administration in time range)  ARIPiprazole (ABILIFY) tablet 5 mg (has no administration in time range)  escitalopram (LEXAPRO) tablet 20 mg (has no administration in time range)  pravastatin (PRAVACHOL) tablet 10 mg (has no administration in time range)  predniSONE (DELTASONE) tablet 10 mg (has no administration in time range)  levalbuterol (XOPENEX) nebulizer solution 0.63 mg (has no administration in time range)  metoprolol tartrate (LOPRESSOR) injection 5 mg (has no administration in time range)  ipratropium-albuterol (DUONEB) 0.5-2.5 (3) MG/3ML nebulizer solution 3 mL (3 mLs Nebulization Given 04/18/17 0651)  sodium chloride 0.9 % bolus 1,000 mL (0 mLs Intravenous Stopped 04/18/17 0853)  ondansetron (ZOFRAN) injection 4 mg (4 mg Intravenous Given 04/18/17 0633)  iopamidol (ISOVUE-300) 61 % injection 100 mL (100 mLs Intravenous Contrast Given 04/18/17 0702)  methylPREDNISolone sodium succinate (SOLU-MEDROL) 125 mg/2 mL injection 125 mg (125 mg Intravenous Given 04/18/17 0858)  ipratropium-albuterol (DUONEB) 0.5-2.5 (3) MG/3ML nebulizer solution 3 mL (3 mLs Nebulization Given 04/18/17 0855)  albuterol (PROVENTIL) (2.5 MG/3ML) 0.083% nebulizer solution 5 mg (5 mg Nebulization Given 04/18/17 1051)     Initial Impression / Assessment and Plan / ED Course  I have reviewed the triage vital signs and the nursing notes.  Pertinent labs & imaging results that were available during my care of the patient were reviewed by me and considered in my medical decision making (see chart for details).     Patient presents with COPD exacerbation.  He is afebrile but tachypneic in the ED and at  times requires increased oxygen via nasal cannula.  He has globally diminished breath sounds with scattered wheezing.  Chest x-ray shows no active  cardiopulmonary disease.  Lab work reviewed by me shows no leukocytosis, elevated bicarb which appears to be worsening over the past 2 weeks but no electrolyte abnormalities.  LFTs, creatinine, and lipase within normal limits.  Troponin is negative and I doubt ACS or MI.  UA shows glucosuria and ketones but is not concerning for UTI or nephrolithiasis.  He has had 1 month of persistent diarrhea but CT of the abdomen and pelvis shows no acute intra-abdominal findings or acute surgical abdominal pathology.  He does have a 9.6 mm nodule over the left lower lobe of the lung which will require follow-up imaging in 4-6 weeks.  Patient was ambulated on his normal 2 L via nasal cannula but desatted to 89% and became tachycardic to 160 bpm.  He endorses ongoing shortness of breath despite multiple breathing treatments and Solu-Medrol.  Spoke with Dr. Zigmund Daniel with [facility] hospital[facility] service who agrees to assume care of patient and bring him into the hospital for further evaluation and management of his COPD exacerbation.  Final Clinical Impressions(s) / ED Diagnoses   Final diagnoses:  COPD exacerbation [facility])    ED Discharge Orders    None       Debroah Baller 04/18/17 1643    Molpus, Jenny Reichmann, MD 04/18/17 2232

## 2017-04-18 NOTE — Progress Notes (Signed)
MD made aware of pulse running in 120's. Orders placed.

## 2017-04-18 NOTE — ED Triage Notes (Signed)
Per EMS, pt was here last week. Was supposed to get a steroid rx, but did not have the money. He has gotten gradually worse. Pt c/o chest tightness and upper GI pain.   10 mg albuterol given by EMS 1 mg Atrovent given by EMS 125 mg solu medrol given by EMS

## 2017-04-18 NOTE — ED Notes (Signed)
Pt given coffee and [place] sandwich per PA [facility] order

## 2017-04-18 NOTE — ED Notes (Signed)
Urine culture sent to lab.

## 2017-04-18 NOTE — ED Notes (Signed)
He returns from CT at this time. He is cheerful and in no distress.

## 2017-04-18 NOTE — ED Notes (Signed)
Pt ambulated in hakllway with 2L nasal cannula, pt's o2 decreased to 89% and pt's heart rate increased to 165 for a minute then ranged between  130 and 150. Pt placed on 3L when returned to room. Pt felt very out of breath.

## 2017-04-19 DIAGNOSIS — J441 Chronic obstructive pulmonary disease with (acute) exacerbation: Secondary | ICD-10-CM | POA: Diagnosis not present

## 2017-04-19 MED ORDER — PREDNISONE 20 MG PO TABS: ORAL_TABLET | ORAL | 0 refills | Status: DC

## 2017-04-19 MED ORDER — FLUTICASONE-UMECLIDIN-VILANT 100-62.5-25 MCG/INH IN AEPB: 1.0000 | INHALATION_SPRAY | Freq: Every day | RESPIRATORY_TRACT | 0 refills | Status: DC

## 2017-04-19 MED ORDER — ALBUTEROL SULFATE HFA 108 (90 BASE) MCG/ACT IN AERS: 1.0000 | INHALATION_SPRAY | Freq: Four times a day (QID) | RESPIRATORY_TRACT | 0 refills | Status: DC | PRN

## 2017-04-19 NOTE — Progress Notes (Signed)
Discharge instructions and medications discussed with patient.  Prescriptions and AVS given to patient. All questions answered.  

## 2017-04-19 NOTE — Progress Notes (Signed)
PTAR arranged. Pt states his oxygen is at his [facility] apt and he does not have anyone who can pick up him. Oxygen is through the [place], unable to get a portable tank from vendor. Jonnie Finner RN CCM Case Mgmt phone [phone]

## 2017-04-19 NOTE — Progress Notes (Deleted)
Physician Discharge Summary  Taylor [name]:[id number] DOB: 1955-09-14 DOA: 04/18/2017  PCP: Taylor Coots, NP  Admit date: 04/18/2017 Discharge date: 04/19/2017  Admitted From: Home Disposition: Home  Discharge Condition: Stable CODE STATUS:Full Diet recommendation: Heart Healthy   Brief/Interim Summary: Admission HPI: HPI: Taylor [name] is a 62 y.o. male with medical history significant for chronic hypoxic respiratory failure on home oxygen 2 L, bipolar disorder, history of adenocarcinoma of the colon status post treatment, prostate cancer, depression, tobacco use admitted with complaints of increasing shortness of breath.  Patient was recently discharged from the hospital 04/09/2017.  It looks like he had difficulty filling his prescriptions.  Patient lives in a boarding home with 4 other people.  And he was supposed to follow-up with the clinic [facility] and get his prescriptions.  Patient reported whenever he went there there was nobody there.  I have read through case management notes his PCP is not there on Fridays and the clinic is closed after 3 PM every day.  Patient apparently went to the clinic after lunch time or on Fridays. .  However case management has been contacted at this admission to arrange for prescriptions to be given or after discharge.  Patient does have a pulmonary follow-up made in 6 weeks.  However patient was ambulated in the ER and his oxygen saturation dropped to 89% on 2 L.  He denies any fever chills cough at this time.  He said he quit smoking a year ago.  No nausea vomiting diarrhea constipation fever or chills.  Hospital Course: Patient's hospital course remained stable.  His respiratory status has improved this morning.  I feel that patient is a stable for discharge to home.  He is already on home oxygen.  And is to follow-up with his PCP in a week and pulmonology as an outpatient.  We have provided appropriate prescriptions for the COPD.   Following  problems were addressed during his hosptalization:  1] Acute on chronic hypoxic respiratory failure due to COPD exacerbation :Patient presented with complaints of shortness of breath after not being able to fill his outpatient medication since the day of discharge on 04/10/2017.  Patient became hypoxic upon ambulation in the ER.  He has an appointment follow-up with pulmonary. He was treated with nebulizers and prednisone at this time.Held IV steroids as he was not wheezing significant at this time.  His chest x-ray was clear . The CT scan done in the emergency room showed 9 x 6 mm irregular nodule in the left lower lobe with a few smaller subcentimeter nodules in the left lower lobe which are new.  Findings may be secondary to metastatic disease.  A follow-up CT recommended in 4-6 weeks. Patient is on home oxygen.  2] Bipolar disorder:Continue Abilify and Lexapro.  Follow-up with [facility] behavioral health.  3] History of adenocarcinoma of the colon:Patient was due to get follow-up appointment with  GI but has not done so yet.We will encourage for the follow up  4] H/O prostate cancer: Follow up as an outpatient.   Discharge Diagnoses:  Active Problems:   COPD exacerbation [facility])    Discharge Instructions  Discharge Instructions    Diet - low sodium heart healthy   Complete by:  As directed    Discharge instructions   Complete by:  As directed    1) Follow up with your PCP in a week. 2) Take prescribed medications as instructed. 3)Do a follow up CT chest in 4-6 weeks for  the follow-up of the pulmonary nodule seen on the left lower lobe on the CT imaging done here.This can be done as per your PCP referral. 4) Follow up with pulmonology as an outpatient.   Increase activity slowly   Complete by:  As directed      Allergies as of 04/19/2017      Reactions   Codeine Nausea And Vomiting      Medication List    TAKE these medications   albuterol (2.5 MG/3ML) 0.083% nebulizer  solution Commonly known as:  PROVENTIL Take 3 mLs (2.5 mg total) by nebulization 4 (four) times daily. Dx: J44.9   albuterol 108 (90 Base) MCG/ACT inhaler Commonly known as:  PROVENTIL HFA;VENTOLIN HFA Inhale 1-2 puffs into the lungs every 6 (six) hours as needed for wheezing or shortness of breath.   ARIPiprazole 5 MG tablet Commonly known as:  ABILIFY Take 1 tablet (5 mg total) by mouth every morning.   escitalopram 20 MG tablet Commonly known as:  LEXAPRO Take 1 tablet (20 mg total) by mouth every morning.   Fluticasone-Umeclidin-Vilant 100-62.5-25 MCG/INH Aepb Commonly known as:  TRELEGY ELLIPTA Inhale 1 puff into the lungs daily.   ipratropium 0.02 % nebulizer solution Commonly known as:  ATROVENT Take 2.5 mLs (0.5 mg total) by nebulization 4 (four) times daily. Dx: J44.9   lovastatin 20 MG tablet Commonly known as:  MEVACOR Take 1 tablet (20 mg total) by mouth at bedtime.   OXYGEN Inhale 2 L into the lungs.   predniSONE 20 MG tablet Commonly known as:  DELTASONE Take 2 tabs daily for 5 days. What changed:    medication strength  additional instructions      Follow-up Information    [name], [name], NP. Go to.   Why:  Please go see [name] on 04/21/2017 in the morning to get your medications or the first morning after you leave the hospital.  Connect with the [place] team to possibly help you with transportation and other needs!!! Contact information: [place] [address] [phone]          Allergies  Allergen Reactions  . Codeine Nausea And Vomiting    Consultations: None  Procedures/Studies: Dg Chest 2 View  Result Date: 04/18/2017 CLINICAL DATA:  62 year old male with shortness of breath and COPD. EXAM: CHEST - 2 VIEW COMPARISON:  Chest radiograph dated 04/05/2017 FINDINGS: The lungs are clear. There is no pleural effusion or pneumothorax. There is hyperexpansion of the lungs with flattening of the diaphragms, likely  related to air trapping and COPD. The cardiac silhouette is within normal limits. Old healed right posterior rib fractures. No acute osseous pathology. IMPRESSION: No active cardiopulmonary disease. Electronically Signed   By: Anner Crete M.D.   On: 04/18/2017 06:29   Dg Chest 2 View  Result Date: 03/23/2017 CLINICAL DATA:  Shortness of breath EXAM: CHEST  2 VIEW COMPARISON:  03/21/2017 FINDINGS: Lungs are hyperinflated. The heart size and mediastinal contours are within normal limits. Both lungs are clear. The visualized skeletal structures are unremarkable. IMPRESSION: Unchanged hyperinflation suggesting COPD. No active cardiopulmonary disease. Electronically Signed   By: Ulyses Jarred M.D.   On: 03/23/2017 22:21   Dg Chest 2 View  Result Date: 03/21/2017 CLINICAL DATA:  Short of breath for 2 days EXAM: CHEST  2 VIEW COMPARISON:  02/22/2017, 02/17/2017, 01/09/2017 FINDINGS: Hyperinflated lungs with emphysematous disease. No acute pulmonary opacity or pleural effusion. Stable cardiomediastinal silhouette with aortic atherosclerosis. No pneumothorax. Multiple old  right-sided rib fractures IMPRESSION: No active cardiopulmonary disease. Hyperinflation with emphysematous disease. Electronically Signed   By: Donavan Foil M.D.   On: 03/21/2017 22:26   Ct Abdomen Pelvis W Contrast  Result Date: 04/18/2017 CLINICAL DATA:  Lower abdominal pain. Previous history of colon cancer post sigmoid colectomy September 2018. EXAM: CT ABDOMEN AND PELVIS WITH CONTRAST TECHNIQUE: Multidetector CT imaging of the abdomen and pelvis was performed using the standard protocol following bolus administration of intravenous contrast. CONTRAST:  130mL ISOVUE-300 IOPAMIDOL (ISOVUE-300) INJECTION 61% COMPARISON:  10/18/2016 and 05/22/2016 as well as 04/15/2012 FINDINGS: Lower chest: There is a 6 x 9 mm irregular nodule over the posterior left lower lobe. There are 3 other smaller subcentimeter nodules over the left lower lobe.  These nodules are new. Right base is normal. No effusion. Hepatobiliary: Normal. Pancreas: Normal. Spleen: Normal. Adrenals/Urinary Tract: Adrenal glands are normal. Kidneys normal size without hydronephrosis or nephrolithiasis. 1 cm hypodensity over the mid to lower pole right kidney as well as 1 cm hypodensity over the mid to lower pole left kidney compatible with small cysts. Ureters and bladder are normal. Stomach/Bowel: Stomach and small bowel normal. Appendix is normal. Colon is unremarkable. Vascular/Lymphatic: Minimal calcified plaque over the abdominal aorta and iliac arteries. No adenopathy. Reproductive: Normal. Other: No free fluid or focal inflammatory change. Musculoskeletal: Degenerative change of the spine. Subtle anterior wedging of T12 which Corrales node over the superior endplate unchanged. Schmorl's node over the superior endplate of D63. IMPRESSION: No acute findings in the abdomen/pelvis. 9 x 6 mm irregular nodule over the left lower lobe with a few smaller subcentimeter nodules in the left lower lobe which are new. Findings may be due to metastatic disease in this patient with known colon cancer, although may represent an infectious or inflammatory process. Recommend follow-up CT 4-6 weeks. Small bilateral renal cysts. Aortic Atherosclerosis (ICD10-I70.0). Electronically Signed   By: Marin Olp M.D.   On: 04/18/2017 07:56   Dg Chest Port 1 View  Result Date: 04/06/2017 CLINICAL DATA:  Dyspnea.  COPD. EXAM: PORTABLE CHEST 1 VIEW COMPARISON:  03/27/2017 chest radiograph. FINDINGS: Stable cardiomediastinal silhouette with normal heart size. No pneumothorax. No pleural effusion. Hyperinflated lungs and emphysema. No pulmonary edema. No acute consolidative airspace disease. Stable healed upper right rib deformities. IMPRESSION: 1. Hyperinflated lungs and emphysema, compatible with COPD. 2. No acute cardiopulmonary disease. Electronically Signed   By: Ilona Sorrel M.D.   On: 04/06/2017 00:07    Dg Chest Port 1 View  Result Date: 03/27/2017 CLINICAL DATA:  Shortness of breath EXAM: PORTABLE CHEST 1 VIEW COMPARISON:  03/23/2017 03/21/2017, 02/22/2017 FINDINGS: Hyperinflation with emphysematous disease. No focal pulmonary infiltrate or effusion. Normal cardiomediastinal silhouette. No pneumothorax. IMPRESSION: No active disease.  Hyperinflation with emphysematous disease Electronically Signed   By: Donavan Foil M.D.   On: 03/27/2017 19:37    (Echo, Carotid, EGD, Colonoscopy, ERCP)    Subjective: Patient seen and examined the bedside this morning.  Feels better today.  Not wheezing at the moment.  Stable for discharge to home. Discharge Exam: Vitals:   04/19/17 0457 04/19/17 0842  BP: (!) 153/81 138/68  Pulse: 78 85  Resp:  16  Temp: 98.2 F (36.8 C) 98.4 F (36.9 C)  SpO2: 97% 98%   Vitals:   04/18/17 2026 04/18/17 2310 04/19/17 0457 04/19/17 0842  BP: (!) 153/75  (!) 153/81 138/68  Pulse: 87  78 85  Resp: 20   16  Temp: 98.8 F (37.1 C)  98.2  F (36.8 C) 98.4 F (36.9 C)  TempSrc: Oral  Oral Oral  SpO2: 98% 98% 97% 98%  Weight:      Height:        General: Pt is alert, awake, not in acute distress Cardiovascular: RRR, S1/S2 +, no rubs, no gallops Respiratory: Decreased air entry bilaterally, no wheezing, no rhonchi Abdominal: Soft, NT, ND, bowel sounds + Extremities: no edema, no cyanosis    The results of significant diagnostics from this hospitalization (including imaging, microbiology, ancillary and laboratory) are listed below for reference.     Microbiology: No results found for this or any previous visit (from the past 240 hour(s)).   Labs: BNP (last 3 results) Recent Labs    12/12/16 1248 04/05/17 2241  BNP 63.3 46.5   Basic Metabolic Panel: Recent Labs  Lab 04/18/17 0203  NA 144  K 3.9  CL 101  CO2 35*  GLUCOSE 160*  BUN 9  CREATININE 0.62  CALCIUM 8.3*   Liver Function Tests: Recent Labs  Lab 04/18/17 0638  AST 29  ALT  31  ALKPHOS 57  BILITOT 0.8  PROT 6.5  ALBUMIN 3.5   Recent Labs  Lab 04/18/17 0638  LIPASE 26   No results for input(s): AMMONIA in the last 168 hours. CBC: Recent Labs  Lab 04/18/17 0203  WBC 9.5  HGB 12.7*  HCT 39.8  MCV 99.0  PLT 191   Cardiac Enzymes: No results for input(s): CKTOTAL, CKMB, CKMBINDEX, TROPONINI in the last 168 hours. BNP: Invalid input(s): POCBNP CBG: No results for input(s): GLUCAP in the last 168 hours. D-Dimer No results for input(s): DDIMER in the last 72 hours. Hgb A1c No results for input(s): HGBA1C in the last 72 hours. Lipid Profile No results for input(s): CHOL, HDL, LDLCALC, TRIG, CHOLHDL, LDLDIRECT in the last 72 hours. Thyroid function studies No results for input(s): TSH, T4TOTAL, T3FREE, THYROIDAB in the last 72 hours.  Invalid input(s): FREET3 Anemia work up No results for input(s): VITAMINB12, FOLATE, FERRITIN, TIBC, IRON, RETICCTPCT in the last 72 hours. Urinalysis    Component Value Date/Time   COLORURINE YELLOW 04/18/2017 0809   APPEARANCEUR CLEAR 04/18/2017 0809   LABSPEC 1.023 04/18/2017 0809   PHURINE 7.0 04/18/2017 0809   GLUCOSEU >=500 (A) 04/18/2017 0809   HGBUR NEGATIVE 04/18/2017 0809   BILIRUBINUR NEGATIVE 04/18/2017 0809   KETONESUR 5 (A) 04/18/2017 0809   PROTEINUR NEGATIVE 04/18/2017 0809   UROBILINOGEN 1.0 01/09/2013 1843   NITRITE NEGATIVE 04/18/2017 0809   LEUKOCYTESUR NEGATIVE 04/18/2017 0809   Sepsis Labs Invalid input(s): PROCALCITONIN,  WBC,  LACTICIDVEN Microbiology No results found for this or any previous visit (from the past 240 hour(s)).   Time coordinating discharge: Over 30 minutes  SIGNED:   Shelly Coss, MD  [facility] 04/19/2017, 12:21 PM Pager [phone]  If 7PM-7AM, please contact night-coverage [email or web address] Password [email or web address]

## 2017-04-21 NOTE — Discharge Summary (Signed)
Physician Discharge Summary    BEAUX WEDEMEYER QIH:[id number] DOB: February 16, 1955 DOA: 04/18/2017     PCP: [name], NP     Admit date: 04/18/2017  Discharge date: 04/19/2017     Admitted From: Home  Disposition: Home     Discharge Condition: Stable  CODE STATUS:Full  Diet recommendation: Heart Healthy      Brief/Interim Summary:  Admission HPI:  HPI: Taylor [name] is a 62 y.o. male with medical history significant for chronic hypoxic respiratory failure on home oxygen 2 L, bipolar disorder, history of adenocarcinoma of the colon status post treatment, prostate cancer, depression, tobacco use admitted with complaints of increasing shortness of breath.  Patient was recently discharged from the hospital 04/09/2017.  It looks like he had difficulty filling his prescriptions.  Patient lives in a boarding home with 4 other people.  And he was supposed to follow-up with the clinic [facility] and get his prescriptions.  Patient reported whenever he went there there was nobody there.  I have read through case management notes his PCP is not there on Fridays and the clinic is closed after 3 PM every day.  Patient apparently went to the clinic after lunch time or on Fridays. .  However case management has been contacted at this admission to arrange for prescriptions to be given or after discharge.  Patient does have a pulmonary follow-up made in 6 weeks.  However patient was ambulated in the ER and his oxygen saturation dropped to 89% on 2 L.  He denies any fever chills cough at this time.  He said he quit smoking a year ago.  No nausea vomiting diarrhea constipation fever or chills.     Hospital Course:  Patient's hospital course remained stable.  His respiratory status has improved this morning.  I feel that patient is a stable for discharge to home.  He is already on home oxygen.  And is to follow-up with his PCP in a week and pulmonology as an outpatient.  We have provided  appropriate prescriptions for the COPD.      Following problems were addressed during his hosptalization:     1] Acute on chronic hypoxic respiratory failure due to COPD exacerbation :Patient presented with complaints of shortness of breath after not being able to fill his outpatient medication since the day of discharge on 04/10/2017.  Patient became hypoxic upon ambulation in the ER.  He has an appointment follow-up with pulmonary. He was treated with nebulizers and prednisone at this time.Held IV steroids as he was not wheezing significant at this time.  His chest x-ray was clear . The CT scan done in the emergency room showed 9 x 6 mm irregular nodule in the left lower lobe with a few smaller subcentimeter nodules in the left lower lobe which are new.  Findings may be secondary to metastatic disease.  A follow-up CT recommended in 4-6 weeks.  Patient is on home oxygen.     2] Bipolar disorder:Continue Abilify and Lexapro.  Follow-up with [facility] behavioral health.     3] History of adenocarcinoma of the colon:Patient was due to get follow-up appointment with  GI but has not done so yet.We will encourage for the follow up     4] H/O prostate cancer: Follow up as an outpatient.        Discharge Diagnoses:   Active Problems:    COPD exacerbation [facility])           Discharge Instructions  Discharge Instructions          Diet - low sodium heart healthy     Complete by:  As directed           Discharge instructions     Complete by:  As directed           1) Follow up with your PCP in a week.  2) Take prescribed medications as instructed.  3)Do a follow up CT chest in 4-6 weeks for the follow-up of the pulmonary nodule seen on the left lower lobe on the CT imaging done here.This can be done as per your PCP referral.  4) Follow up with pulmonology as an outpatient.        Increase activity slowly     Complete by:  As directed                       Allergies as of 04/19/2017                 Reactions         Codeine   Nausea And Vomiting                    Medication List          TAKE these medications      albuterol (2.5 MG/3ML) 0.083% nebulizer solution  Commonly known as:  PROVENTIL  Take 3 mLs (2.5 mg total) by nebulization 4 (four) times daily. Dx: J44.9       albuterol 108 (90 Base) MCG/ACT inhaler  Commonly known as:  PROVENTIL HFA;VENTOLIN HFA  Inhale 1-2 puffs into the lungs every 6 (six) hours as needed for wheezing or shortness of breath.       ARIPiprazole 5 MG tablet  Commonly known as:  ABILIFY  Take 1 tablet (5 mg total) by mouth every morning.       escitalopram 20 MG tablet  Commonly known as:  LEXAPRO  Take 1 tablet (20 mg total) by mouth every morning.       Fluticasone-Umeclidin-Vilant 100-62.5-25 MCG/INH Aepb  Commonly known as:  TRELEGY ELLIPTA  Inhale 1 puff into the lungs daily.       ipratropium 0.02 % nebulizer solution  Commonly known as:  ATROVENT  Take 2.5 mLs (0.5 mg total) by nebulization 4 (four) times daily. Dx: J44.9       lovastatin 20 MG tablet  Commonly known as:  MEVACOR  Take 1 tablet (20 mg total) by mouth at bedtime.       OXYGEN  Inhale 2 L into the lungs.       predniSONE 20 MG tablet  Commonly known as:  DELTASONE  Take 2 tabs daily for 5 days.  What changed:    .medication strength   .additional instructions                     Follow-up Information            [name], [name], NP. Go to.     Why:  Please go see [name] on 04/21/2017 in the morning to get your medications or the first morning after you leave the hospital.     Connect with the [place] team to possibly help you with transportation and other needs!!!  Contact information:  [place] [address]  [phone]  Allergies    Allergen   Reactions    .   Codeine   Nausea And Vomiting          Consultations:  None     Procedures/Studies:  Imaging Results                                                                 .    (Echo, Carotid, EGD, Colonoscopy, ERCP)         Subjective:  Patient seen and examined the bedside this morning.  Feels better today.  Not wheezing at the moment.  Stable for discharge to home.  Discharge Exam:       Vitals:        04/19/17 0457   04/19/17 0842    BP:   (!) 153/81   138/68    Pulse:   78   85    Resp:       16    Temp:   98.2 F (36.8 C)   98.4 F (36.9 C)    SpO2:   97%   98%              Vitals:        04/18/17 2026   04/18/17 2310   04/19/17 0457   04/19/17 0842    BP:   (!) 153/75       (!) 153/81   138/68    Pulse:   87       78   85    Resp:   20           16    Temp:   98.8 F (37.1 C)       98.2 F (36.8 C)   98.4 F (36.9 C)    TempSrc:   Oral       Oral   Oral    SpO2:   98%   98%   97%   98%    Weight:                    Height:                          General: Pt is alert, awake, not in acute distress  Cardiovascular: RRR, S1/S2 +, no rubs, no gallops  Respiratory: Decreased air entry bilaterally, no wheezing, no rhonchi  Abdominal: Soft, NT, ND, bowel sounds +  Extremities: no edema, no cyanosis               The results of significant diagnostics from this hospitalization (including imaging, microbiology, ancillary and laboratory) are listed below for reference.           Microbiology:  No results found for this or any previous visit (from the past 240 hour(s)).      Labs:  BNP (last 3 results)  Recent Labs (within last 365 days)                                           Basic Metabolic Panel:  Last Labs  Liver Function Tests:  Last Labs                                                                     Last Labs                                   Last Labs        CBC:  Last Labs                                                              Cardiac Enzymes:   Last Labs        BNP:   Last Labs        CBG:   Last Labs        D-Dimer   Recent Labs (last 2 labs)        Hgb A1c   Recent Labs (last 2 labs)        Lipid Profile   Recent Labs (last 2 labs)        Thyroid function studies    Recent Labs (last 2 labs)             Anemia work up   [employer] (last 2 labs)        Urinalysis  Labs (Brief)                                                                                                                                                                                         Sepsis Labs   Last Labs        Microbiology  No results found for this or any previous visit (from the past 240 hour(s)).        Time coordinating discharge: Over 30 minutes     SIGNED:        Shelly Coss, MD        [facility]  04/19/2017, 12:21 PM  Pager [phone]     If 7PM-7AM, please contact night-coverage  [email or web address]  Password [email or web address]

## 2017-04-25 NOTE — Telephone Encounter (Signed)
Attempted to contact pt. No answer, no option to leave a message. Will try back.  

## 2017-04-25 NOTE — Telephone Encounter (Signed)
Per TP: okay for patient to go back on the Breo if [facility] can help him with cost/samples.  Thank you for checking into this for the patient.

## 2017-04-25 NOTE — Telephone Encounter (Signed)
Pt is Taylor [name] patient last OV with TP 04/15/17; last OV with Taylor [name] 06/05/16  Please review below message; patient is requesting if you would like them to remain on Breo  TP please advise

## 2017-04-27 ENCOUNTER — Inpatient Hospital Stay (HOSPITAL_COMMUNITY)
Admission: EM | Admit: 2017-04-27 | Discharge: 2017-05-01 | DRG: 190 | Disposition: A | Discharge status: Home / Self Care | Payer: Medicaid Other | Attending: Internal Medicine | Admitting: Internal Medicine

## 2017-04-27 DIAGNOSIS — Z85038 Personal history of other malignant neoplasm of large intestine: Secondary | ICD-10-CM

## 2017-04-27 DIAGNOSIS — F32A Depression, unspecified: Secondary | ICD-10-CM | POA: Diagnosis present

## 2017-04-27 DIAGNOSIS — E785 Hyperlipidemia, unspecified: Secondary | ICD-10-CM | POA: Diagnosis present

## 2017-04-27 DIAGNOSIS — T380X5A Adverse effect of glucocorticoids and synthetic analogues, initial encounter: Secondary | ICD-10-CM | POA: Diagnosis present

## 2017-04-27 DIAGNOSIS — Z801 Family history of malignant neoplasm of trachea, bronchus and lung: Secondary | ICD-10-CM

## 2017-04-27 DIAGNOSIS — R140 Abdominal distension (gaseous): Secondary | ICD-10-CM

## 2017-04-27 DIAGNOSIS — J441 Chronic obstructive pulmonary disease with (acute) exacerbation: Principal | ICD-10-CM | POA: Diagnosis present

## 2017-04-27 DIAGNOSIS — R739 Hyperglycemia, unspecified: Secondary | ICD-10-CM | POA: Diagnosis present

## 2017-04-27 DIAGNOSIS — Z7951 Long term (current) use of inhaled steroids: Secondary | ICD-10-CM

## 2017-04-27 DIAGNOSIS — Z9981 Dependence on supplemental oxygen: Secondary | ICD-10-CM

## 2017-04-27 DIAGNOSIS — Z8546 Personal history of malignant neoplasm of prostate: Secondary | ICD-10-CM

## 2017-04-27 DIAGNOSIS — C189 Malignant neoplasm of colon, unspecified: Secondary | ICD-10-CM | POA: Diagnosis present

## 2017-04-27 DIAGNOSIS — F319 Bipolar disorder, unspecified: Secondary | ICD-10-CM | POA: Diagnosis present

## 2017-04-27 DIAGNOSIS — F329 Major depressive disorder, single episode, unspecified: Secondary | ICD-10-CM | POA: Diagnosis present

## 2017-04-27 DIAGNOSIS — J9621 Acute and chronic respiratory failure with hypoxia: Secondary | ICD-10-CM | POA: Diagnosis present

## 2017-04-27 DIAGNOSIS — Z87891 Personal history of nicotine dependence: Secondary | ICD-10-CM

## 2017-04-27 DIAGNOSIS — Z885 Allergy status to narcotic agent status: Secondary | ICD-10-CM

## 2017-04-27 DIAGNOSIS — Z9049 Acquired absence of other specified parts of digestive tract: Secondary | ICD-10-CM

## 2017-04-28 ENCOUNTER — Emergency Department (HOSPITAL_COMMUNITY): Payer: Medicaid Other

## 2017-04-28 ENCOUNTER — Other Ambulatory Visit: Payer: Self-pay

## 2017-04-28 ENCOUNTER — Encounter (HOSPITAL_COMMUNITY): Payer: Self-pay | Admitting: Emergency Medicine

## 2017-04-28 DIAGNOSIS — Z801 Family history of malignant neoplasm of trachea, bronchus and lung: Secondary | ICD-10-CM | POA: Diagnosis not present

## 2017-04-28 DIAGNOSIS — Z885 Allergy status to narcotic agent status: Secondary | ICD-10-CM | POA: Diagnosis not present

## 2017-04-28 DIAGNOSIS — R739 Hyperglycemia, unspecified: Secondary | ICD-10-CM | POA: Diagnosis present

## 2017-04-28 DIAGNOSIS — J441 Chronic obstructive pulmonary disease with (acute) exacerbation: Secondary | ICD-10-CM | POA: Diagnosis present

## 2017-04-28 DIAGNOSIS — I361 Nonrheumatic tricuspid (valve) insufficiency: Secondary | ICD-10-CM | POA: Diagnosis not present

## 2017-04-28 DIAGNOSIS — T380X5A Adverse effect of glucocorticoids and synthetic analogues, initial encounter: Secondary | ICD-10-CM | POA: Diagnosis present

## 2017-04-28 DIAGNOSIS — Z7951 Long term (current) use of inhaled steroids: Secondary | ICD-10-CM | POA: Diagnosis not present

## 2017-04-28 DIAGNOSIS — F339 Major depressive disorder, recurrent, unspecified: Secondary | ICD-10-CM | POA: Diagnosis not present

## 2017-04-28 DIAGNOSIS — F319 Bipolar disorder, unspecified: Secondary | ICD-10-CM | POA: Diagnosis present

## 2017-04-28 DIAGNOSIS — Z85038 Personal history of other malignant neoplasm of large intestine: Secondary | ICD-10-CM | POA: Diagnosis not present

## 2017-04-28 DIAGNOSIS — E785 Hyperlipidemia, unspecified: Secondary | ICD-10-CM | POA: Diagnosis present

## 2017-04-28 DIAGNOSIS — Z8546 Personal history of malignant neoplasm of prostate: Secondary | ICD-10-CM | POA: Diagnosis not present

## 2017-04-28 DIAGNOSIS — C189 Malignant neoplasm of colon, unspecified: Secondary | ICD-10-CM | POA: Diagnosis not present

## 2017-04-28 DIAGNOSIS — J9601 Acute respiratory failure with hypoxia: Secondary | ICD-10-CM | POA: Diagnosis not present

## 2017-04-28 DIAGNOSIS — Z9049 Acquired absence of other specified parts of digestive tract: Secondary | ICD-10-CM | POA: Diagnosis not present

## 2017-04-28 DIAGNOSIS — J9621 Acute and chronic respiratory failure with hypoxia: Secondary | ICD-10-CM | POA: Diagnosis present

## 2017-04-28 DIAGNOSIS — Z9981 Dependence on supplemental oxygen: Secondary | ICD-10-CM | POA: Diagnosis not present

## 2017-04-28 DIAGNOSIS — Z87891 Personal history of nicotine dependence: Secondary | ICD-10-CM | POA: Diagnosis not present

## 2017-04-28 LAB — CBC WITH DIFFERENTIAL/PLATELET
Basophils Absolute: 0 10*3/uL (ref 0.0–0.1)
Basophils Relative: 0 %
EOS ABS: 0.2 10*3/uL (ref 0.0–0.7)
Eosinophils Relative: 3 %
HCT: 40.8 % (ref 39.0–52.0)
Hemoglobin: 12.6 g/dL — ABNORMAL LOW (ref 13.0–17.0)
LYMPHS ABS: 2.2 10*3/uL (ref 0.7–4.0)
Lymphocytes Relative: 27 %
MCH: 30.4 pg (ref 26.0–34.0)
MCHC: 30.9 g/dL (ref 30.0–36.0)
MCV: 98.3 fL (ref 78.0–100.0)
MONOS PCT: 8 %
Monocytes Absolute: 0.6 10*3/uL (ref 0.1–1.0)
NEUTROS PCT: 62 %
Neutro Abs: 5 10*3/uL (ref 1.7–7.7)
Platelets: 183 10*3/uL (ref 150–400)
RBC: 4.15 MIL/uL — ABNORMAL LOW (ref 4.22–5.81)
RDW: 13.5 % (ref 11.5–15.5)
WBC: 8 10*3/uL (ref 4.0–10.5)

## 2017-04-28 LAB — I-STAT CHEM 8, ED
BUN: 18 mg/dL (ref 6–20)
CALCIUM ION: 1.13 mmol/L — AB (ref 1.15–1.40)
CREATININE: 0.7 mg/dL (ref 0.61–1.24)
Chloride: 97 mmol/L — ABNORMAL LOW (ref 101–111)
Glucose, Bld: 109 mg/dL — ABNORMAL HIGH (ref 65–99)
HCT: 37 % — ABNORMAL LOW (ref 39.0–52.0)
Hemoglobin: 12.6 g/dL — ABNORMAL LOW (ref 13.0–17.0)
Potassium: 4.2 mmol/L (ref 3.5–5.1)
SODIUM: 142 mmol/L (ref 135–145)
TCO2: 39 mmol/L — AB (ref 22–32)

## 2017-04-28 LAB — CBC
HEMATOCRIT: 39.2 % (ref 39.0–52.0)
HEMOGLOBIN: 12.3 g/dL — AB (ref 13.0–17.0)
MCH: 30.6 pg (ref 26.0–34.0)
MCHC: 31.4 g/dL (ref 30.0–36.0)
MCV: 97.5 fL (ref 78.0–100.0)
Platelets: 179 10*3/uL (ref 150–400)
RBC: 4.02 MIL/uL — ABNORMAL LOW (ref 4.22–5.81)
RDW: 13.4 % (ref 11.5–15.5)
WBC: 8.6 10*3/uL (ref 4.0–10.5)

## 2017-04-28 LAB — BASIC METABOLIC PANEL
Anion gap: 10 (ref 5–15)
BUN: 14 mg/dL (ref 6–20)
CALCIUM: 8.4 mg/dL — AB (ref 8.9–10.3)
CO2: 33 mmol/L — AB (ref 22–32)
Chloride: 98 mmol/L — ABNORMAL LOW (ref 101–111)
Creatinine, Ser: 0.71 mg/dL (ref 0.61–1.24)
Glucose, Bld: 249 mg/dL — ABNORMAL HIGH (ref 65–99)
POTASSIUM: 3.9 mmol/L (ref 3.5–5.1)
Sodium: 141 mmol/L (ref 135–145)

## 2017-04-28 LAB — MRSA PCR SCREENING: MRSA BY PCR: NEGATIVE

## 2017-04-28 LAB — MAGNESIUM: Magnesium: 2.2 mg/dL (ref 1.7–2.4)

## 2017-04-28 LAB — I-STAT TROPONIN, ED: TROPONIN I, POC: 0 ng/mL (ref 0.00–0.08)

## 2017-04-28 LAB — GLUCOSE, CAPILLARY: GLUCOSE-CAPILLARY: 287 mg/dL — AB (ref 65–99)

## 2017-04-28 MED ORDER — ALBUTEROL SULFATE (2.5 MG/3ML) 0.083% IN NEBU: 2.5000 mg | INHALATION_SOLUTION | RESPIRATORY_TRACT | Status: DC | PRN

## 2017-04-28 MED ORDER — METHYLPREDNISOLONE SODIUM SUCC 40 MG IJ SOLR
40.0000 mg | Freq: Two times a day (BID) | INTRAMUSCULAR | Status: DC
  Administered 2017-04-28 – 2017-04-30 (×5): 40 mg via INTRAVENOUS
  Filled 2017-04-28 (×5): qty 1

## 2017-04-28 MED ORDER — ARIPIPRAZOLE 5 MG PO TABS
5.0000 mg | ORAL_TABLET | Freq: Every day | ORAL | Status: DC
  Administered 2017-04-28 – 2017-05-01 (×4): 5 mg via ORAL
  Filled 2017-04-28 (×4): qty 1

## 2017-04-28 MED ORDER — ACETAMINOPHEN 650 MG RE SUPP: 650.0000 mg | Freq: Four times a day (QID) | RECTAL | Status: DC | PRN

## 2017-04-28 MED ORDER — METHYLPREDNISOLONE SODIUM SUCC 125 MG IJ SOLR
125.0000 mg | Freq: Once | INTRAMUSCULAR | Status: AC
  Administered 2017-04-28: 125 mg via INTRAVENOUS
  Filled 2017-04-28: qty 2

## 2017-04-28 MED ORDER — ONDANSETRON HCL 4 MG PO TABS: 4.0000 mg | ORAL_TABLET | Freq: Four times a day (QID) | ORAL | Status: DC | PRN

## 2017-04-28 MED ORDER — ONDANSETRON HCL 4 MG/2ML IJ SOLN: 4.0000 mg | Freq: Four times a day (QID) | INTRAMUSCULAR | Status: DC | PRN

## 2017-04-28 MED ORDER — ENOXAPARIN SODIUM 40 MG/0.4ML ~~LOC~~ SOLN
40.0000 mg | SUBCUTANEOUS | Status: DC
  Administered 2017-04-28 – 2017-04-30 (×3): 40 mg via SUBCUTANEOUS
  Filled 2017-04-28 (×4): qty 0.4

## 2017-04-28 MED ORDER — ACETAMINOPHEN 325 MG PO TABS: 650.0000 mg | ORAL_TABLET | Freq: Four times a day (QID) | ORAL | Status: DC | PRN

## 2017-04-28 MED ORDER — INSULIN ASPART 100 UNIT/ML ~~LOC~~ SOLN
0.0000 [IU] | Freq: Three times a day (TID) | SUBCUTANEOUS | Status: DC
  Administered 2017-04-28: 8 [IU] via SUBCUTANEOUS
  Administered 2017-04-29: 3 [IU] via SUBCUTANEOUS
  Administered 2017-04-29: 8 [IU] via SUBCUTANEOUS
  Administered 2017-04-29: 3 [IU] via SUBCUTANEOUS
  Administered 2017-04-30: 8 [IU] via SUBCUTANEOUS
  Administered 2017-04-30: 5 [IU] via SUBCUTANEOUS
  Administered 2017-04-30: 3 [IU] via SUBCUTANEOUS

## 2017-04-28 MED ORDER — IPRATROPIUM BROMIDE 0.02 % IN SOLN
0.5000 mg | RESPIRATORY_TRACT | Status: DC
  Administered 2017-04-28: 0.5 mg via RESPIRATORY_TRACT
  Filled 2017-04-28: qty 2.5

## 2017-04-28 MED ORDER — NICOTINE 14 MG/24HR TD PT24
14.0000 mg | MEDICATED_PATCH | Freq: Every day | TRANSDERMAL | Status: DC
  Administered 2017-04-28 – 2017-05-01 (×4): 14 mg via TRANSDERMAL
  Filled 2017-04-28 (×4): qty 1

## 2017-04-28 MED ORDER — BUDESONIDE 0.25 MG/2ML IN SUSP
0.2500 mg | Freq: Two times a day (BID) | RESPIRATORY_TRACT | Status: DC
  Administered 2017-04-28 – 2017-05-01 (×7): 0.25 mg via RESPIRATORY_TRACT
  Filled 2017-04-28 (×7): qty 2

## 2017-04-28 MED ORDER — ALBUTEROL (5 MG/ML) CONTINUOUS INHALATION SOLN
20.0000 mg | INHALATION_SOLUTION | RESPIRATORY_TRACT | Status: DC
  Administered 2017-04-28: 20 mg via RESPIRATORY_TRACT
  Filled 2017-04-28: qty 20

## 2017-04-28 MED ORDER — PRAVASTATIN SODIUM 20 MG PO TABS
20.0000 mg | ORAL_TABLET | Freq: Every day | ORAL | Status: DC
  Administered 2017-04-28 – 2017-04-30 (×3): 20 mg via ORAL
  Filled 2017-04-28 (×3): qty 1

## 2017-04-28 MED ORDER — IPRATROPIUM-ALBUTEROL 0.5-2.5 (3) MG/3ML IN SOLN
3.0000 mL | Freq: Four times a day (QID) | RESPIRATORY_TRACT | Status: DC
  Administered 2017-04-28 – 2017-04-29 (×5): 3 mL via RESPIRATORY_TRACT
  Filled 2017-04-28 (×5): qty 3

## 2017-04-28 MED ORDER — ALBUTEROL SULFATE (2.5 MG/3ML) 0.083% IN NEBU
2.5000 mg | INHALATION_SOLUTION | RESPIRATORY_TRACT | Status: DC
  Administered 2017-04-28: 2.5 mg via RESPIRATORY_TRACT
  Filled 2017-04-28: qty 3

## 2017-04-28 MED ORDER — ESCITALOPRAM OXALATE 20 MG PO TABS
20.0000 mg | ORAL_TABLET | Freq: Every day | ORAL | Status: DC
  Administered 2017-04-28 – 2017-05-01 (×4): 20 mg via ORAL
  Filled 2017-04-28: qty 2
  Filled 2017-04-28 (×3): qty 1

## 2017-04-28 NOTE — Progress Notes (Signed)
Inpatient Diabetes Program ecommendations  AACE/ADA: New Consensus Statement on Inpatient Glycemic Control (2015)  Target Ranges:  Prepandial:   less than 140 mg/dL      Peak postprandial:   less than 180 mg/dL (1-2 hours)      Critically ill patients:  140 - 180 mg/dL   Results for XANDER, JUTRAS (MRN [id number]) as of 04/28/2017 09:44  Ref. Range 04/28/2017 03:57  Glucose Latest Ref Range: 65 - 99 mg/dL 249 (H)   Review of Glycemic Control  Diabetes history: None Current orders for Inpatient glycemic control: None  Inpatient Diabetes Program Recommendations:    Lab glucose 249 mg/dl this am. Patient receiving IV solumedrol 40 mg Q12 hours. Please consider CBGs and Novolog Moderate Correction 0-15 units tid.  Thanks,  Tama Headings RN, MSN, BC-ADM, [facility] Inpatient Diabetes Coordinator Team Pager [phone] (8a-5p)

## 2017-04-28 NOTE — ED Notes (Signed)
Admitting at bedside 

## 2017-04-28 NOTE — H&P (Signed)
History and Physical    Taylor [name] OZH:[id number] DOB: 08/02/1955 DOA: 04/27/2017  PCP: [name], NP  Patient coming from: Home.  Chief Complaint: Shortness of breath.  HPI: Taylor [name] is a 62 y.o. male with history of COPD, bipolar disorder, adenocarcinoma of the colon in remission presents to the ER with complaints of worsening shortness of breath over the last 24 hours.  Denies any fever chills productive cough or chest pain.  Patient states he has been compliant with his medications.  Patient has had recurrent admissions for COPD.  ED Course: In the ER patient is found to be diffusely wheezing and was placed on BiPAP.  Patient was given IV methylprednisolone and nebulizer treatment.  At the time of my exam patient is still on BiPAP but able to talk sentences completely.  Review of Systems: As per HPI, rest all negative.   Past Medical History:  Diagnosis Date  . Bipolar 1 disorder ([place]) 02/05/2012  . Cancer ([place]) 04/11/11   adenocarcinoma of colon, 7/19 nodes pos.FINISHED CHEMO/DR. SHERRILL  . COPD (chronic obstructive pulmonary disease) ([place])    SMOKER  . Depression    Bipolar disorder  . Full dentures   . Hemorrhoids   . Inguinal hernia    RIGHT- PAINFUL  . Prostate cancer ([place])    Gleason score = 7, supposed to have radiation therapy but he has not followed up  . Rib fractures    hx of  . Shortness of breath     Past Surgical History:  Procedure Laterality Date  . COLON SURGERY  04/11/11   Sigmoid colectomy  . COLOSTOMY REVISION  04/11/2011   Procedure: COLON RESECTION SIGMOID;  Surgeon: [name], MD;  Location: [facility];  Service: General;  Laterality: N/A;  low anterior colon resection   . INGUINAL HERNIA REPAIR Right 05/01/2012   Procedure: HERNIA REPAIR INGUINAL ADULT;  Surgeon: [name], MD;  Location: [facility];  Service: General;  Laterality: Right;  . INSERTION OF MESH Right 05/01/2012   Procedure: INSERTION OF MESH;  Surgeon:  [name], MD;  Location: [facility];  Service: General;  Laterality: Right;  . PORT-A-CATH REMOVAL Left 05/01/2012   Procedure: REMOVAL Infusion Port;  Surgeon: [name], MD;  Location: [facility];  Service: General;  Laterality: Left;  . PORTACATH PLACEMENT    . PORTACATH PLACEMENT  05/02/2011   Procedure: INSERTION PORT-A-CATH;  Surgeon: [name], MD;  Location: [facility];  Service: General;  Laterality: N/A;     reports that he quit smoking about 15 months ago. His smoking use included cigarettes. He has a 45.00 pack-year smoking history. He has quit using smokeless tobacco. He reports that he does not drink alcohol or use drugs.  Allergies  Allergen Reactions  . Codeine Nausea And Vomiting    Family History  Problem Relation Age of Onset  . Heart disease Father   . Lung cancer Maternal Uncle        smoked    Prior to Admission medications   Medication Sig Start Date End Date Taking? Authorizing Provider  albuterol (PROVENTIL HFA;VENTOLIN HFA) 108 (90 Base) MCG/ACT inhaler Inhale 1-2 puffs into the lungs every 6 (six) hours as needed for wheezing or shortness of breath. 04/19/17  Yes Shelly Coss, MD  albuterol (PROVENTIL) (2.5 MG/3ML) 0.083% nebulizer solution Take 3 mLs (2.5 mg total) by nebulization 4 (four) times daily. Dx: J44.9 04/15/17  Yes Parrett, Fonnie Mu, NP  ARIPiprazole (ABILIFY) 5 MG tablet Take 1 tablet (5 mg total) by mouth every morning. 03/27/17  Yes Shahmehdi, Seyed A, MD  escitalopram (LEXAPRO) 20 MG tablet Take 1 tablet (20 mg total) by mouth every morning. 03/27/17  Yes Shahmehdi, Seyed A, MD  Fluticasone-Umeclidin-Vilant (TRELEGY ELLIPTA) 100-62.5-25 MCG/INH AEPB Inhale 1 puff into the lungs daily. 04/19/17 05/19/17 Yes Shelly Coss, MD  lovastatin (MEVACOR) 20 MG tablet Take 1 tablet (20 mg total) by mouth at bedtime. 04/09/17  Yes Hongalgi, Lenis Dickinson, MD  ipratropium (ATROVENT) 0.02 % nebulizer solution Take 2.5 mLs (0.5 mg total) by nebulization 4 (four) times  daily. Dx: J44.9 Patient not taking: Reported on 04/18/2017 04/15/17   Parrett, Fonnie Mu, NP  OXYGEN Inhale 2 L into the lungs.    [provider]  predniSONE (DELTASONE) 20 MG tablet Take 2 tabs daily for 5 days. Patient not taking: Reported on 04/28/2017 04/19/17   Shelly Coss, MD    Physical Exam: Vitals:   04/28/17 0018 04/28/17 0030 04/28/17 0100 04/28/17 0130  BP: 117/84 131/81 128/74 127/76  Pulse: 97 95 94 94  Resp: (!) 27 (!) 25 19 (!) 24  Temp:      TempSrc:      SpO2: 100% 100% 100% 99%  Weight:      Height:          Constitutional: Moderately built and nourished. Vitals:   04/28/17 0018 04/28/17 0030 04/28/17 0100 04/28/17 0130  BP: 117/84 131/81 128/74 127/76  Pulse: 97 95 94 94  Resp: (!) 27 (!) 25 19 (!) 24  Temp:      TempSrc:      SpO2: 100% 100% 100% 99%  Weight:      Height:       Eyes: Anicteric no pallor. ENMT: No discharge from the ears eyes nose or mouth. Neck: No mass felt.  No JVD appreciated. Respiratory: Bilateral expiratory wheeze and no crepitations. Cardiovascular: S1-S2 heard no murmurs appreciated. Abdomen: Soft nontender bowel sounds present. Musculoskeletal: No edema.  No joint effusion. Skin: No rash. Neurologic: Alert awake oriented to time place and person.  Moves all extremities. Psychiatric: Appears normal with normal affect.   Labs on Admission: I have personally reviewed following labs and imaging studies  CBC: Recent Labs  Lab 04/28/17 0034 04/28/17 0044  WBC 8.0  --   NEUTROABS 5.0  --   HGB 12.6* 12.6*  HCT 40.8 37.0*  MCV 98.3  --   PLT 183  --    Basic Metabolic Panel: Recent Labs  Lab 04/28/17 0034 04/28/17 0044  NA  --  142  K  --  4.2  CL  --  97*  GLUCOSE  --  109*  BUN  --  18  CREATININE  --  0.70  MG 2.2  --    GFR: Estimated Creatinine Clearance: 101.3 mL/min (by C-G formula based on SCr of 0.7 mg/dL). Liver Function Tests: No results for input(s): AST, ALT, ALKPHOS, BILITOT, PROT,  ALBUMIN in the last 168 hours. No results for input(s): LIPASE, AMYLASE in the last 168 hours. No results for input(s): AMMONIA in the last 168 hours. Coagulation Profile: No results for input(s): INR, PROTIME in the last 168 hours. Cardiac Enzymes: No results for input(s): CKTOTAL, CKMB, CKMBINDEX, TROPONINI in the last 168 hours. BNP (last 3 results) No results for input(s): PROBNP in the last 8760 hours. HbA1C: No results for input(s): HGBA1C in the last 72 hours. CBG: No results for input(s): GLUCAP  in the last 168 hours. Lipid Profile: No results for input(s): CHOL, HDL, LDLCALC, TRIG, CHOLHDL, LDLDIRECT in the last 72 hours. Thyroid Function Tests: No results for input(s): TSH, T4TOTAL, FREET4, T3FREE, THYROIDAB in the last 72 hours. Anemia Panel: No results for input(s): VITAMINB12, FOLATE, FERRITIN, TIBC, IRON, RETICCTPCT in the last 72 hours. Urine analysis:    Component Value Date/Time   COLORURINE YELLOW 04/18/2017 0809   APPEARANCEUR CLEAR 04/18/2017 0809   LABSPEC 1.023 04/18/2017 0809   PHURINE 7.0 04/18/2017 0809   GLUCOSEU >=500 (A) 04/18/2017 0809   HGBUR NEGATIVE 04/18/2017 0809   BILIRUBINUR NEGATIVE 04/18/2017 0809   KETONESUR 5 (A) 04/18/2017 0809   PROTEINUR NEGATIVE 04/18/2017 0809   UROBILINOGEN 1.0 01/09/2013 1843   NITRITE NEGATIVE 04/18/2017 0809   LEUKOCYTESUR NEGATIVE 04/18/2017 0809   Sepsis Labs: @LABRCNTIP (procalcitonin:4,lacticidven:4) )No results found for this or any previous visit (from the past 240 hour(s)).   Radiological Exams on Admission: Dg Chest Portable 1 View  Result Date: 04/28/2017 CLINICAL DATA:  Shortness of breath tonight EXAM: PORTABLE CHEST 1 VIEW COMPARISON:  04/18/2017 FINDINGS: Emphysematous changes and scattered fibrosis in the lungs. No airspace disease or consolidation. No blunting of costophrenic angles. No pneumothorax. Mediastinal contours appear intact. Heart size and pulmonary vascularity are normal. Old right rib  fractures. IMPRESSION: Emphysematous changes in the lungs. No evidence of active pulmonary disease. Electronically Signed   By: Lucienne Capers M.D.   On: 04/28/2017 00:43    EKG: Independently reviewed.  Normal sinus rhythm.  Assessment/Plan Principal Problem:   Acute respiratory failure with hypoxia (HCC) Active Problems:   Colon cancer, sigmoid   Depression   COPD exacerbation (HCC)   COPD with acute exacerbation ([place])    1. Acute respiratory failure with hypoxia likely secondary to COPD exacerbation -patient placed on BiPAP.  Continue with IV Solu-Medrol Pulmicort nebulizer treatment.  Patient not having any productive cough or fever. 2. Bipolar disorder we will continue with home medications.  Abilify and Lexapro. 3. Hyperlipidemia on statins. 4. History of adenocarcinoma of the colon in remission.  Will need GI follow-up.   DVT prophylaxis: Lovenox. Code Status: Full code. Family Communication: Discussed with patient. Disposition Plan: Home. Consults called: None. Admission status: Inpatient.   Rise Patience MD [facility] Pager [phone].  If 7PM-7AM, please contact night-coverage [email or web address] Password [email or web address]  04/28/2017, 3:40 AM

## 2017-04-28 NOTE — ED Triage Notes (Signed)
Pt from home via [facility]. C/o SHOB x1 day that progressivley gotten worse. Tried home meds w/ no relief. Hx of COPD. 87% on home O2 @ 3L. Endorses centralized CP rated @ 6/10. Given 5mg  Albuterol & 0.5mg  Atrovent PTA by [facility]. Pt A&O x4 on arrival. EDP and RT @ bedside.

## 2017-04-28 NOTE — Progress Notes (Signed)
PROGRESS NOTE  Taylor [name] JKD:[id number] DOB: August 20, 1955 DOA: 04/27/2017 PCP: [name], NP  HPI/Recap of past 24 hours: Taylor [name] is a 62 y.o. male with history of chronic hypoxic resp failure O2 dependent, current tobacco abuse, bipolar disorder, adenocarcinoma of the colon in remission, prostate CA presents to the ER with complaints of worsening shortness of breath over the last 24 hours PTA. Has chronic non-productive cough. Denies any fever, chills, chest pain. Patient states he has been compliant with his medications, just finished his course of prednisone. Of note, patient has had multiple admissions for COPD this year. In the ED, patient was found to be diffusely wheezing and was placed on BiPAP. Patient admitted for further management in SDU.  Today, saw pt off bipap, saturating well on O2 [place], still with some mild exp wheezing. Denies any chest pain, fever/chills, abdominal pain.  Assessment/Plan: Principal Problem:   Acute respiratory failure with hypoxia (HCC) Active Problems:   Colon cancer, sigmoid   Depression   COPD exacerbation (HCC)   COPD with acute exacerbation (HCC)  Acute on chronic hypoxic resp failure/COPD exacebation Improving, no longer requiring bipap, sat well on O2 Afebrile, no leukocytosis CXR: Emphysematous changes in the lungs. 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 Last A1c 6.2 in 2014, will repeat  Glucose on BMP >200 SSI, accuchecks since pt on steroid  Tobacco abuse Counseled against tobacco use Nicotine patch  Hx of colon & prostate CA Follow up as an outpt  Bipolar disorder Continue abilify and lexapro      Code Status: Full  Family Communication: None at bedside  Disposition Plan: Home once stable   Consultants:  None  Procedures:  None   Antimicrobials:  None    DVT prophylaxis:  Lovenox   Objective: Vitals:   04/28/17 0800 04/28/17 0845 04/28/17 0930 04/28/17 1015  BP: 132/74 122/67 131/72 129/73  Pulse: 95 93 89 97  Resp: (!) 29 (!) 25 17 (!) 26  Temp:      TempSrc:      SpO2: 96% 94% 95% 94%  Weight:      Height:       No intake or output data in the 24 hours ending 04/28/17 1139 Filed Weights   04/28/17 0010  Weight: 74.8 kg (165 lb)    Exam:   General:  NAD  Cardiovascular: S1, S2 present  Respiratory: Diminished BS, mild wheezing noed  Abdomen: Soft, non-tender, ND, BS present  Musculoskeletal: No pedal edema present  Skin: Normal  Psychiatry: Normal mood   Data Reviewed: CBC: Recent Labs  Lab 04/28/17 0034 04/28/17 0044 04/28/17 0357  WBC 8.0  --  8.6  NEUTROABS 5.0  --   --   HGB 12.6* 12.6* 12.3*  HCT 40.8 37.0* 39.2  MCV 98.3  --  97.5  PLT 183  --  099   Basic Metabolic Panel: Recent Labs  Lab 04/28/17 0034 04/28/17 0044 04/28/17 0357  NA  --  142 141  K  --  4.2 3.9  CL  --  97* 98*  CO2  --   --  33*  GLUCOSE  --  109* 249*  BUN  --  18 14  CREATININE  --  0.70 0.71  CALCIUM  --   --  8.4*  MG 2.2  --   --    GFR: Estimated Creatinine Clearance: 101.3  mL/min (by C-G formula based on SCr of 0.71 mg/dL). Liver Function Tests: No results for input(s): AST, ALT, ALKPHOS, BILITOT, PROT, ALBUMIN in the last 168 hours. No results for input(s): LIPASE, AMYLASE in the last 168 hours. No results for input(s): AMMONIA in the last 168 hours. Coagulation Profile: No results for input(s): INR, PROTIME in the last 168 hours. Cardiac Enzymes: No results for input(s): CKTOTAL, CKMB, CKMBINDEX, TROPONINI in the last 168 hours. BNP (last 3 results) No results for input(s): PROBNP in the last 8760 hours. HbA1C: No results for input(s): HGBA1C in the last 72 hours. CBG: No results for input(s): GLUCAP in the last 168 hours. Lipid Profile: No results for input(s): CHOL, HDL, LDLCALC, TRIG,  CHOLHDL, LDLDIRECT in the last 72 hours. Thyroid Function Tests: No results for input(s): TSH, T4TOTAL, FREET4, T3FREE, THYROIDAB in the last 72 hours. Anemia Panel: No results for input(s): VITAMINB12, FOLATE, FERRITIN, TIBC, IRON, RETICCTPCT in the last 72 hours. Urine analysis:    Component Value Date/Time   COLORURINE YELLOW 04/18/2017 0809   APPEARANCEUR CLEAR 04/18/2017 0809   LABSPEC 1.023 04/18/2017 0809   PHURINE 7.0 04/18/2017 0809   GLUCOSEU >=500 (A) 04/18/2017 0809   HGBUR NEGATIVE 04/18/2017 0809   BILIRUBINUR NEGATIVE 04/18/2017 0809   KETONESUR 5 (A) 04/18/2017 0809   PROTEINUR NEGATIVE 04/18/2017 0809   UROBILINOGEN 1.0 01/09/2013 1843   NITRITE NEGATIVE 04/18/2017 0809   LEUKOCYTESUR NEGATIVE 04/18/2017 0809   Sepsis Labs: @LABRCNTIP (procalcitonin:4,lacticidven:4)  )No results found for this or any previous visit (from the past 240 hour(s)).    Studies: Dg Chest Portable 1 View  Result Date: 04/28/2017 CLINICAL DATA:  Shortness of breath tonight EXAM: PORTABLE CHEST 1 VIEW COMPARISON:  04/18/2017 FINDINGS: Emphysematous changes and scattered fibrosis in the lungs. No airspace disease or consolidation. No blunting of costophrenic angles. No pneumothorax. Mediastinal contours appear intact. Heart size and pulmonary vascularity are normal. Old right rib fractures. IMPRESSION: Emphysematous changes in the lungs. No evidence of active pulmonary disease. Electronically Signed   By: Lucienne Capers M.D.   On: 04/28/2017 00:43    Scheduled Meds: . ARIPiprazole  5 mg Oral Daily  . budesonide (PULMICORT) nebulizer solution  0.25 mg Nebulization BID  . enoxaparin (LOVENOX) injection  40 mg Subcutaneous Q24H  . escitalopram  20 mg Oral Daily  . ipratropium-albuterol  3 mL Nebulization Q6H  . methylPREDNISolone (SOLU-MEDROL) injection  40 mg Intravenous Q12H  . pravastatin  20 mg Oral q1800    Continuous Infusions:   LOS: 0 days     Alma Friendly, MD [facility]  If 7PM-7AM, please contact night-coverage [email or web address] Password [facility] 04/28/2017, 11:39 AM

## 2017-04-28 NOTE — Telephone Encounter (Signed)
Attempted to call patient, no answer, message left to call back.  

## 2017-04-28 NOTE — Progress Notes (Signed)
This note also relates to the following rows which could not be included: Pulse Rate - Cannot attach notes to unvalidated device data Resp - Cannot attach notes to unvalidated device data SpO2 - Cannot attach notes to unvalidated device data  Bipap placed in stand bby mode at this time to give pt a break.  No distress noted and pt was placed on a 3lpm [place] as he wears at home.  RT will continue to monitor.

## 2017-04-28 NOTE — ED Notes (Signed)
Report attempted 

## 2017-04-28 NOTE — ED Provider Notes (Signed)
[facility] EMERGENCY DEPARTMENT Provider Note   CSN: [id number] Arrival date & time: 04/27/17  2358     History   Chief Complaint Chief Complaint  Patient presents with  . Respiratory Distress    HPI Taylor [name] is a 62 y.o. male.  The history is provided by the patient.  Shortness of Breath  This is a recurrent problem. The average episode lasts 1 day. The problem occurs continuously.The current episode started 12 to 24 hours ago. The problem has not changed since onset.Associated symptoms include wheezing. Pertinent negatives include no fever, no headaches, no coryza, no rhinorrhea, no sore throat, no swollen glands, no cough, no sputum production, no PND, no orthopnea, no chest pain, no syncope, no vomiting, no abdominal pain, no rash, no leg pain, no leg swelling and no claudication. It is unknown what precipitated the problem. Risk factors: unknown. He has tried nothing for the symptoms. The treatment provided no relief. He has had prior hospitalizations. He has had prior ED visits. Associated medical issues include COPD.    Past Medical History:  Diagnosis Date  . Bipolar 1 disorder ([place]) 02/05/2012  . Cancer ([place]) 04/11/11   adenocarcinoma of colon, 7/19 nodes pos.FINISHED CHEMO/DR. SHERRILL  . COPD (chronic obstructive pulmonary disease) ([place])    SMOKER  . Depression    Bipolar disorder  . Full dentures   . Hemorrhoids   . Inguinal hernia    RIGHT- PAINFUL  . Prostate cancer ([place])    Gleason score = 7, supposed to have radiation therapy but he has not followed up  . Rib fractures    hx of  . Shortness of breath     Patient Active Problem List   Diagnosis Date Noted  . Acute on chronic respiratory failure with hypoxia and hypercapnia ([place]) 04/06/2017  . Leukocytosis 04/06/2017  . Adjustment disorder with depressed mood 03/28/2017  . Adjustment disorder with mixed disturbance of emotions and conduct   . Chronic respiratory failure with  hypoxia ([place]) 03/24/2017  . Normocytic normochromic anemia 03/24/2017  . COPD with acute exacerbation ([place]) 10/22/2016  . Acute bronchitis due to human metapneumovirus 01/16/2013  . Alcohol abuse 01/09/2013  . COPD exacerbation ([place]) 01/09/2013  . Chest pain 01/09/2013  . Acute respiratory failure with hypoxia ([place]) 01/09/2013  . Smoker 12/16/2012  . Inguinal hernia unilateral, non-recurrent, right 05/01/2012  . Dyspepsia 02/05/2012  . Hyperglycemia 02/04/2012  . COPD  GOLD III 02/03/2012  . Thrombocytopenia ([place]) 02/03/2012  . Depression   . 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 COLON SURGERY  04/11/11   Sigmoid colectomy  . COLOSTOMY REVISION  04/11/2011   Procedure: COLON RESECTION SIGMOID;  Surgeon: [name], MD;  Location: [facility];  Service: General;  Laterality: N/A;  low anterior colon resection   . INGUINAL HERNIA REPAIR Right 05/01/2012   Procedure: HERNIA REPAIR INGUINAL ADULT;  Surgeon: [name], MD;  Location: [facility];  Service: General;  Laterality: Right;  . INSERTION OF MESH Right 05/01/2012   Procedure: INSERTION OF MESH;  Surgeon: [name], MD;  Location: [facility];  Service: General;  Laterality: Right;  . PORT-A-CATH REMOVAL Left 05/01/2012   Procedure: REMOVAL Infusion Port;  Surgeon: [name], MD;  Location: [facility];  Service: General;  Laterality: Left;  . PORTACATH PLACEMENT    . PORTACATH PLACEMENT  05/02/2011   Procedure: INSERTION PORT-A-CATH;  Surgeon: [name],  MD;  Location: [facility];  Service: General;  Laterality: N/A;        Home Medications    Prior to Admission medications   Medication Sig Start Date End Date Taking? Authorizing Provider  albuterol (PROVENTIL HFA;VENTOLIN HFA) 108 (90 Base) MCG/ACT inhaler Inhale 1-2 puffs into the lungs every 6 (six) hours as needed for wheezing or shortness of breath. 04/19/17   Shelly Coss, MD  albuterol (PROVENTIL) (2.5 MG/3ML) 0.083%  nebulizer solution Take 3 mLs (2.5 mg total) by nebulization 4 (four) times daily. Dx: J44.9 04/15/17   Parrett, Fonnie Mu, NP  ARIPiprazole (ABILIFY) 5 MG tablet Take 1 tablet (5 mg total) by mouth every morning. 03/27/17   Shahmehdi, Valeria Batman, MD  escitalopram (LEXAPRO) 20 MG tablet Take 1 tablet (20 mg total) by mouth every morning. 03/27/17   Shahmehdi, Valeria Batman, MD  Fluticasone-Umeclidin-Vilant (TRELEGY ELLIPTA) 100-62.5-25 MCG/INH AEPB Inhale 1 puff into the lungs daily. 04/19/17 05/19/17  Shelly Coss, MD  ipratropium (ATROVENT) 0.02 % nebulizer solution Take 2.5 mLs (0.5 mg total) by nebulization 4 (four) times daily. Dx: J44.9 Patient not taking: Reported on 04/18/2017 04/15/17   Parrett, Fonnie Mu, NP  lovastatin (MEVACOR) 20 MG tablet Take 1 tablet (20 mg total) by mouth at bedtime. 04/09/17   Hongalgi, Lenis Dickinson, MD  OXYGEN Inhale 2 L into the lungs.    [provider]  predniSONE (DELTASONE) 20 MG tablet Take 2 tabs daily for 5 days. 04/19/17   Shelly Coss, MD    Family History Family History  Problem Relation Age of Onset  . Heart disease Father   . Lung cancer Maternal Uncle        smoked    Social History Social History   Tobacco Use  . Smoking status: Former Smoker    Packs/day: 1.00    Years: 45.00    Pack years: 45.00    Types: Cigarettes    Last attempt to quit: 01/22/2016    Years since quitting: 1.2  . Smokeless tobacco: Former Network engineer Use Topics  . Alcohol use: No    Comment: h/o use in the past, no h/o heavy use  . Drug use: No     Allergies   Codeine   Review of Systems Review of Systems  Constitutional: Negative for diaphoresis and fever.  HENT: Negative for rhinorrhea and sore throat.   Respiratory: Positive for shortness of breath and wheezing. Negative for cough, sputum production and stridor.   Cardiovascular: Negative for chest pain, orthopnea, claudication, leg swelling, syncope and PND.  Gastrointestinal: Negative for abdominal pain  and vomiting.  Skin: Negative for rash.  Neurological: Negative for headaches.  All other systems reviewed and are negative.    Physical Exam Updated Vital Signs BP 117/84   Pulse 97   Temp 98.5 F (36.9 C) (Rectal)   Resp (!) 27   Ht 5\' 11"  (1.803 m)   Wt 74.8 kg (165 lb)   SpO2 100%   BMI 23.01 kg/m   Physical Exam  Constitutional: He is oriented to person, place, and time. He appears well-developed and well-nourished. He appears distressed.  HENT:  Head: Normocephalic and atraumatic.  Mouth/Throat: No oropharyngeal exudate.  Eyes: Pupils are equal, round, and reactive to light. Conjunctivae are normal.  Neck: Normal range of motion. Neck supple.  Cardiovascular: Normal rate, regular rhythm, normal heart sounds and intact distal pulses.  Pulmonary/Chest: Tachypnea noted. He is in respiratory distress. He has wheezes.  Abdominal: Soft. Bowel  sounds are normal. He exhibits no mass. There is no tenderness. There is no rebound and no guarding.  Musculoskeletal: Normal range of motion.  Neurological: He is alert and oriented to person, place, and time. He displays normal reflexes.  Skin: Skin is warm and dry. Capillary refill takes less than 2 seconds.  Psychiatric: He has a normal mood and affect.     ED Treatments / Results  Labs (all labs ordered are listed, but only abnormal results are displayed) Results for orders placed or performed during the hospital encounter of 04/27/17  I-Stat Chem 8, ED  Result Value Ref Range   Sodium 142 135 - 145 mmol/L   Potassium 4.2 3.5 - 5.1 mmol/L   Chloride 97 (L) 101 - 111 mmol/L   BUN 18 6 - 20 mg/dL   Creatinine, Ser 0.70 0.61 - 1.24 mg/dL   Glucose, Bld 109 (H) 65 - 99 mg/dL   Calcium, Ion 1.13 (L) 1.15 - 1.40 mmol/L   TCO2 39 (H) 22 - 32 mmol/L   Hemoglobin 12.6 (L) 13.0 - 17.0 g/dL   HCT 37.0 (L) 39.0 - 52.0 %  I-stat troponin, ED  Result Value Ref Range   Troponin i, poc 0.00 0.00 - 0.08 ng/mL   Comment 3            Dg Chest 2 View  Result Date: 04/18/2017 CLINICAL DATA:  62 year old male with shortness of breath and COPD. EXAM: CHEST - 2 VIEW COMPARISON:  Chest radiograph dated 04/05/2017 FINDINGS: The lungs are clear. There is no pleural effusion or pneumothorax. There is hyperexpansion of the lungs with flattening of the diaphragms, likely related to air trapping and COPD. The cardiac silhouette is within normal limits. Old healed right posterior rib fractures. No acute osseous pathology. IMPRESSION: No active cardiopulmonary disease. Electronically Signed   By: Anner Crete M.D.   On: 04/18/2017 06:29   Ct Abdomen Pelvis W Contrast  Result Date: 04/18/2017 CLINICAL DATA:  Lower abdominal pain. Previous history of colon cancer post sigmoid colectomy September 2018. EXAM: CT ABDOMEN AND PELVIS WITH CONTRAST TECHNIQUE: Multidetector CT imaging of the abdomen and pelvis was performed using the standard protocol following bolus administration of intravenous contrast. CONTRAST:  176mL ISOVUE-300 IOPAMIDOL (ISOVUE-300) INJECTION 61% COMPARISON:  10/18/2016 and 05/22/2016 as well as 04/15/2012 FINDINGS: Lower chest: There is a 6 x 9 mm irregular nodule over the posterior left lower lobe. There are 3 other smaller subcentimeter nodules over the left lower lobe. These nodules are new. Right base is normal. No effusion. Hepatobiliary: Normal. Pancreas: Normal. Spleen: Normal. Adrenals/Urinary Tract: Adrenal glands are normal. Kidneys normal size without hydronephrosis or nephrolithiasis. 1 cm hypodensity over the mid to lower pole right kidney as well as 1 cm hypodensity over the mid to lower pole left kidney compatible with small cysts. Ureters and bladder are normal. Stomach/Bowel: Stomach and small bowel normal. Appendix is normal. Colon is unremarkable. Vascular/Lymphatic: Minimal calcified plaque over the abdominal aorta and iliac arteries. No adenopathy. Reproductive: Normal. Other: No free fluid or focal  inflammatory change. Musculoskeletal: Degenerative change of the spine. Subtle anterior wedging of T12 which Corrales node over the superior endplate unchanged. Schmorl's node over the superior endplate of H08. IMPRESSION: No acute findings in the abdomen/pelvis. 9 x 6 mm irregular nodule over the left lower lobe with a few smaller subcentimeter nodules in the left lower lobe which are new. Findings may be due to metastatic disease in this patient with known colon cancer, although  may represent an infectious or inflammatory process. Recommend follow-up CT 4-6 weeks. Small bilateral renal cysts. Aortic Atherosclerosis (ICD10-I70.0). Electronically Signed   By: Marin Olp M.D.   On: 04/18/2017 07:56   Dg Chest Portable 1 View  Result Date: 04/28/2017 CLINICAL DATA:  Shortness of breath tonight EXAM: PORTABLE CHEST 1 VIEW COMPARISON:  04/18/2017 FINDINGS: Emphysematous changes and scattered fibrosis in the lungs. No airspace disease or consolidation. No blunting of costophrenic angles. No pneumothorax. Mediastinal contours appear intact. Heart size and pulmonary vascularity are normal. Old right rib fractures. IMPRESSION: Emphysematous changes in the lungs. No evidence of active pulmonary disease. Electronically Signed   By: Lucienne Capers M.D.   On: 04/28/2017 00:43   Dg Chest Port 1 View  Result Date: 04/06/2017 CLINICAL DATA:  Dyspnea.  COPD. EXAM: PORTABLE CHEST 1 VIEW COMPARISON:  03/27/2017 chest radiograph. FINDINGS: Stable cardiomediastinal silhouette with normal heart size. No pneumothorax. No pleural effusion. Hyperinflated lungs and emphysema. No pulmonary edema. No acute consolidative airspace disease. Stable healed upper right rib deformities. IMPRESSION: 1. Hyperinflated lungs and emphysema, compatible with COPD. 2. No acute cardiopulmonary disease. Electronically Signed   By: Ilona Sorrel M.D.   On: 04/06/2017 00:07    EKG EKG Interpretation  Date/Time:  Monday Kaylin Schellenberg 08 2019 00:09:07  EDT Ventricular Rate:  107 PR Interval:    QRS Duration: 76 QT Interval:  322 QTC Calculation: 430 R Axis:   46 Text Interpretation:  Sinus tachycardia Low voltage, extremity and precordial leads Baseline wander in lead(s) V1 Confirmed by Randal Buba, Clydell Sposito ([id number]) on 04/28/2017 12:26:49 AM   Radiology Dg Chest Portable 1 View  Result Date: 04/28/2017 CLINICAL DATA:  Shortness of breath tonight EXAM: PORTABLE CHEST 1 VIEW COMPARISON:  04/18/2017 FINDINGS: Emphysematous changes and scattered fibrosis in the lungs. No airspace disease or consolidation. No blunting of costophrenic angles. No pneumothorax. Mediastinal contours appear intact. Heart size and pulmonary vascularity are normal. Old right rib fractures. IMPRESSION: Emphysematous changes in the lungs. No evidence of active pulmonary disease. Electronically Signed   By: Lucienne Capers M.D.   On: 04/28/2017 00:43    Procedures Procedures (including critical care time)  Medications Ordered in ED Medications  albuterol (PROVENTIL,VENTOLIN) solution continuous neb (20 mg Nebulization New Bag/Given 04/28/17 0018)  methylPREDNISolone sodium succinate (SOLU-MEDROL) 125 mg/2 mL injection 125 mg (125 mg Intravenous Given 04/28/17 0026)    MDM Reviewed: previous chart, nursing note and vitals Reviewed previous: labs Interpretation: labs, ECG and x-ray (No PNA on CXR by me no elevation of white count) Total time providing critical care: 30-74 minutes (continuous neb and bipap initiated by me). This excludes time spent performing separately reportable procedures and services. Consults: admitting MD   CRITICAL CARE Performed by: Carlisle Beers Total critical care time: 61 minutes Critical care time was exclusive of separately billable procedures and treating other patients. Critical care was necessary to treat or prevent imminent or life-threatening deterioration. Critical care was time spent personally by me on the following activities:  development of treatment plan with patient and/or surrogate as well as nursing, discussions with consultants, evaluation of patient's response to treatment, examination of patient, obtaining history from patient or surrogate, ordering and performing treatments and interventions, ordering and review of laboratory studies, ordering and review of radiographic studies, pulse oximetry and re-evaluation of patient's condition.   Final Clinical Impressions(s) / ED Diagnoses   Admit to medicine    Kallan Bischoff, MD 04/28/17 0100

## 2017-04-29 ENCOUNTER — Inpatient Hospital Stay (HOSPITAL_COMMUNITY): Payer: Medicaid Other

## 2017-04-29 DIAGNOSIS — I361 Nonrheumatic tricuspid (valve) insufficiency: Secondary | ICD-10-CM

## 2017-04-29 LAB — CBC WITH DIFFERENTIAL/PLATELET
BASOS PCT: 0 %
Basophils Absolute: 0 10*3/uL (ref 0.0–0.1)
Eosinophils Absolute: 0 10*3/uL (ref 0.0–0.7)
Eosinophils Relative: 0 %
HEMATOCRIT: 37.6 % — AB (ref 39.0–52.0)
HEMOGLOBIN: 12.1 g/dL — AB (ref 13.0–17.0)
LYMPHS ABS: 0.8 10*3/uL (ref 0.7–4.0)
LYMPHS PCT: 5 %
MCH: 30.6 pg (ref 26.0–34.0)
MCHC: 32.2 g/dL (ref 30.0–36.0)
MCV: 95.2 fL (ref 78.0–100.0)
Monocytes Absolute: 0.4 10*3/uL (ref 0.1–1.0)
Monocytes Relative: 2 %
NEUTROS ABS: 15.3 10*3/uL — AB (ref 1.7–7.7)
NEUTROS PCT: 93 %
Platelets: 192 10*3/uL (ref 150–400)
RBC: 3.95 MIL/uL — ABNORMAL LOW (ref 4.22–5.81)
RDW: 13 % (ref 11.5–15.5)
WBC: 16.5 10*3/uL — ABNORMAL HIGH (ref 4.0–10.5)

## 2017-04-29 LAB — BASIC METABOLIC PANEL
Anion gap: 10 (ref 5–15)
BUN: 18 mg/dL (ref 6–20)
CHLORIDE: 95 mmol/L — AB (ref 101–111)
CO2: 28 mmol/L (ref 22–32)
CREATININE: 0.88 mg/dL (ref 0.61–1.24)
Calcium: 8.5 mg/dL — ABNORMAL LOW (ref 8.9–10.3)
GFR calc non Af Amer: >60 mL/min (ref >60)
GLUCOSE: 293 mg/dL — AB (ref 65–99)
Potassium: 4.5 mmol/L (ref 3.5–5.1)
Sodium: 133 mmol/L — ABNORMAL LOW (ref 135–145)

## 2017-04-29 LAB — LIPID PANEL
CHOL/HDL RATIO: 2.8 ratio
Cholesterol: 185 mg/dL (ref 0–200)
HDL: 66 mg/dL (ref >40)
LDL Cholesterol: 100 mg/dL — ABNORMAL HIGH (ref 0–99)
Triglycerides: 97 mg/dL (ref <150)
VLDL: 19 mg/dL (ref 0–40)

## 2017-04-29 LAB — GLUCOSE, CAPILLARY
GLUCOSE-CAPILLARY: 178 mg/dL — AB (ref 65–99)
GLUCOSE-CAPILLARY: 258 mg/dL — AB (ref 65–99)
GLUCOSE-CAPILLARY: 201 mg/dL — AB (ref 65–99)
Glucose-Capillary: 178 mg/dL — ABNORMAL HIGH (ref 65–99)

## 2017-04-29 LAB — ECHOCARDIOGRAM COMPLETE
HEIGHTINCHES: 71 in
Weight: 2610.25 oz

## 2017-04-29 LAB — HEMOGLOBIN A1C
Hgb A1c MFr Bld: 6.5 % — ABNORMAL HIGH (ref 4.8–5.6)
MEAN PLASMA GLUCOSE: 139.85 mg/dL

## 2017-04-29 MED ORDER — ALBUTEROL SULFATE (2.5 MG/3ML) 0.083% IN NEBU: 2.5000 mg | INHALATION_SOLUTION | RESPIRATORY_TRACT | Status: DC | PRN

## 2017-04-29 MED ORDER — IPRATROPIUM-ALBUTEROL 0.5-2.5 (3) MG/3ML IN SOLN
3.0000 mL | Freq: Two times a day (BID) | RESPIRATORY_TRACT | Status: DC
  Administered 2017-04-29 – 2017-05-01 (×4): 3 mL via RESPIRATORY_TRACT
  Filled 2017-04-29 (×4): qty 3

## 2017-04-29 NOTE — Care Management Note (Addendum)
Case Management Note  Patient Details  Name: Taylor [name] MRN: [id number] Date of Birth: [birth year]  Subjective/Objective:                 Spoke w patient at the bedside. Confirmed that he is living at [place]. He has oxygen through the [place]. He states he out of transport tanks and is [facility]. He states he has a nebulizer, and medications. He states he has been unable to get his inhalers, CM will need to arrange this with [name] at [facility] for DC. Inhalers are albuterol (for which he has via nebulizer) and Ellipta. [name] is looking into PAP through the Health Department, although approval for this may take a few weeks.  He will likely need [facility]AR transport home, for oxygen use.  Follow up appointment w [facility] on AVS.   16:40 Spoke w [name] [name] at [facility] who has confirmed with the Health Department that patient has active Medicaid. She will not be able to provide free medication due to him having coverage through Medicaid.      Action/Plan:   Expected Discharge Date:                  Expected Discharge Plan:  Home/Self Care  In-House Referral:     Discharge planning Services  CM Consult  Post Acute Care Choice:    Choice offered to:     DME Arranged:    DME Agency:     HH Arranged:    HH Agency:     Status of Service:  In process, will continue to follow  If discussed at Long Length of Stay Meetings, dates discussed:    Additional Comments:  Carles Collet, RN 04/29/2017, 2:32 PM

## 2017-04-29 NOTE — Telephone Encounter (Signed)
ATC pt, no answer. Left message for pt to call back.  

## 2017-04-29 NOTE — Progress Notes (Signed)
PROGRESS NOTE  [name] LEVELS KDT:[id number] DOB: 05-28-1955 DOA: 04/27/2017 PCP: [name], NP  HPI/Recap of past 24 hours: Taylor [name] is a 62 y.o. male with history of chronic hypoxic resp failure O2 dependent, current tobacco abuse, bipolar disorder, adenocarcinoma of the colon in remission, prostate CA presents to the ER with complaints of worsening shortness of breath over the last 24 hours PTA. Has chronic non-productive cough. Denies any fever, chills, chest pain. Patient states he has been compliant with his medications, just finished his course of prednisone. Of note, patient has had multiple admissions for COPD this year. In the ED, patient was found to be diffusely wheezing and was placed on BiPAP. Patient admitted for further management in SDU.  Today, pt saturating well on O2 [place], still with some mild exp wheezing with diminished BS. Denies any chest pain, fever/chills, abdominal pain.  Assessment/Plan: Principal Problem:   Acute respiratory failure with hypoxia (HCC) Active Problems:   Colon cancer, sigmoid   Depression   COPD exacerbation (HCC)   COPD with acute exacerbation (HCC)  Acute on chronic hypoxic resp failure/COPD exacebation Improving, no longer requiring bipap, sat well on O2, still tachypneic Afebrile, with leukocytosis (likely due to steroids) CXR: Emphysematous changes in the lungs. No evidence of active pulmonary disease Continue prednisone, duonebs, O2 Bipap prn ECHO pending official report Multiple admissions for similar issue due to inability to afford some of his meds (inhalers). 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 Glucose on BMP >200 SSI, accuchecks Pt may need meds for DM (?metformin) upon d/c  Tobacco abuse Counseled against tobacco use Nicotine patch  Hx of colon & prostate CA Follow up as an outpt  Bipolar  disorder Continue abilify and lexapro      Code Status: Full  Family Communication: None at bedside  Disposition Plan: Home once stable, likely 04/30/17   Consultants:  None  Procedures:  None   Antimicrobials:  None   DVT prophylaxis:  Lovenox   Objective: Vitals:   04/29/17 0207 04/29/17 0412 04/29/17 0750 04/29/17 1241  BP:  118/74  122/85  Pulse:    84  Resp:    (!) 26  Temp:  98.6 F (37 C)  98.7 F (37.1 C)  TempSrc:  Oral  Oral  SpO2: 97% 96% 95% 96%  Weight:      Height:        Intake/Output Summary (Last 24 hours) at 04/29/2017 1458 Last data filed at 04/29/2017 1230 Gross per 24 hour  Intake 1520 ml  Output 2000 ml  Net -480 ml   Filed Weights   04/28/17 0010 04/28/17 1456  Weight: 74.8 kg (165 lb) 74 kg (163 lb 2.3 oz)    Exam:   General:  NAD  Cardiovascular: S1, S2 present  Respiratory: Diminished BS, mild wheezing noed  Abdomen: Soft, non-tender, ND, BS present  Musculoskeletal: No pedal edema present  Skin: Normal  Psychiatry: Normal mood   Data Reviewed: CBC: Recent Labs  Lab 04/28/17 0034 04/28/17 0044 04/28/17 0357 04/29/17 0242  WBC 8.0  --  8.6 16.5*  NEUTROABS 5.0  --   --  15.3*  HGB 12.6* 12.6* 12.3* 12.1*  HCT 40.8 37.0* 39.2 37.6*  MCV 98.3  --  97.5 95.2  PLT 183  --  179 998   Basic Metabolic Panel: Recent Labs  Lab 04/28/17 0034 04/28/17 0044  04/28/17 0357 04/29/17 0242  NA  --  142 141 133*  K  --  4.2 3.9 4.5  CL  --  97* 98* 95*  CO2  --   --  33* 28  GLUCOSE  --  109* 249* 293*  BUN  --  18 14 18   CREATININE  --  0.70 0.71 0.88  CALCIUM  --   --  8.4* 8.5*  MG 2.2  --   --   --    GFR: Estimated Creatinine Clearance: 91.1 mL/min (by C-G formula based on SCr of 0.88 mg/dL). Liver Function Tests: No results for input(s): AST, ALT, ALKPHOS, BILITOT, PROT, ALBUMIN in the last 168 hours. No results for input(s): LIPASE, AMYLASE in the last 168 hours. No results for input(s): AMMONIA  in the last 168 hours. Coagulation Profile: No results for input(s): INR, PROTIME in the last 168 hours. Cardiac Enzymes: No results for input(s): CKTOTAL, CKMB, CKMBINDEX, TROPONINI in the last 168 hours. BNP (last 3 results) No results for input(s): PROBNP in the last 8760 hours. HbA1C: Recent Labs    04/29/17 0242  HGBA1C 6.5*   CBG: Recent Labs  Lab 04/28/17 1625 04/29/17 0801 04/29/17 1132  GLUCAP 287* 178* 178*   Lipid Profile: Recent Labs    04/29/17 0242  CHOL 185  HDL 66  LDLCALC 100*  TRIG 97  CHOLHDL 2.8   Thyroid Function Tests: No results for input(s): TSH, T4TOTAL, FREET4, T3FREE, THYROIDAB in the last 72 hours. Anemia Panel: No results for input(s): VITAMINB12, FOLATE, FERRITIN, TIBC, IRON, RETICCTPCT in the last 72 hours. Urine analysis:    Component Value Date/Time   COLORURINE YELLOW 04/18/2017 0809   APPEARANCEUR CLEAR 04/18/2017 0809   LABSPEC 1.023 04/18/2017 0809   PHURINE 7.0 04/18/2017 0809   GLUCOSEU >=500 (A) 04/18/2017 0809   HGBUR NEGATIVE 04/18/2017 0809   BILIRUBINUR NEGATIVE 04/18/2017 0809   KETONESUR 5 (A) 04/18/2017 0809   PROTEINUR NEGATIVE 04/18/2017 0809   UROBILINOGEN 1.0 01/09/2013 1843   NITRITE NEGATIVE 04/18/2017 0809   LEUKOCYTESUR NEGATIVE 04/18/2017 0809   Sepsis Labs: @LABRCNTIP (procalcitonin:4,lacticidven:4)  ) Recent Results (from the past 240 hour(s))  MRSA PCR Screening     Status: None   Collection Time: 04/28/17  3:19 PM  Result Value Ref Range Status   MRSA by PCR NEGATIVE NEGATIVE Final    Comment:        The GeneXpert MRSA Assay (FDA approved for NASAL specimens only), is one component of a comprehensive MRSA colonization surveillance program. It is not intended to diagnose MRSA infection nor to guide or monitor treatment for MRSA infections. Performed at [facility], [place] [address], [place], [place] [address]       Studies: No results found.  Scheduled Meds: . ARIPiprazole  5  mg Oral Daily  . budesonide (PULMICORT) nebulizer solution  0.25 mg Nebulization BID  . enoxaparin (LOVENOX) injection  40 mg Subcutaneous Q24H  . escitalopram  20 mg Oral Daily  . insulin aspart  0-15 Units Subcutaneous TID WC  . ipratropium-albuterol  3 mL Nebulization BID  . methylPREDNISolone (SOLU-MEDROL) injection  40 mg Intravenous Q12H  . nicotine  14 mg Transdermal Daily  . pravastatin  20 mg Oral q1800    Continuous Infusions:   LOS: 1 day     Alma Friendly, MD [facility]  If 7PM-7AM, please contact night-coverage [email or web address] Password [email or web address] 04/29/2017, 2:58 PM

## 2017-04-29 NOTE — Progress Notes (Signed)
  Echocardiogram 2D Echocardiogram has been performed.  Jennette Dubin 04/29/2017, 2:23 PM

## 2017-04-30 DIAGNOSIS — J9601 Acute respiratory failure with hypoxia: Secondary | ICD-10-CM

## 2017-04-30 DIAGNOSIS — F339 Major depressive disorder, recurrent, unspecified: Secondary | ICD-10-CM

## 2017-04-30 DIAGNOSIS — J441 Chronic obstructive pulmonary disease with (acute) exacerbation: Principal | ICD-10-CM

## 2017-04-30 DIAGNOSIS — C189 Malignant neoplasm of colon, unspecified: Secondary | ICD-10-CM

## 2017-04-30 LAB — CBC WITH DIFFERENTIAL/PLATELET
BASOS PCT: 0 %
Basophils Absolute: 0 10*3/uL (ref 0.0–0.1)
EOS ABS: 0 10*3/uL (ref 0.0–0.7)
EOS PCT: 0 %
HEMATOCRIT: 39 % (ref 39.0–52.0)
Hemoglobin: 12.6 g/dL — ABNORMAL LOW (ref 13.0–17.0)
Lymphocytes Relative: 7 %
Lymphs Abs: 0.9 10*3/uL (ref 0.7–4.0)
MCH: 30.8 pg (ref 26.0–34.0)
MCHC: 32.3 g/dL (ref 30.0–36.0)
MCV: 95.4 fL (ref 78.0–100.0)
MONO ABS: 0.2 10*3/uL (ref 0.1–1.0)
MONOS PCT: 2 %
NEUTROS ABS: 12.5 10*3/uL — AB (ref 1.7–7.7)
Neutrophils Relative %: 91 %
Platelets: 200 10*3/uL (ref 150–400)
RBC: 4.09 MIL/uL — ABNORMAL LOW (ref 4.22–5.81)
RDW: 13.4 % (ref 11.5–15.5)
WBC: 13.7 10*3/uL — ABNORMAL HIGH (ref 4.0–10.5)

## 2017-04-30 LAB — BASIC METABOLIC PANEL
Anion gap: 12 (ref 5–15)
BUN: 17 mg/dL (ref 6–20)
CALCIUM: 8.6 mg/dL — AB (ref 8.9–10.3)
CO2: 30 mmol/L (ref 22–32)
CREATININE: 0.8 mg/dL (ref 0.61–1.24)
Chloride: 94 mmol/L — ABNORMAL LOW (ref 101–111)
GFR calc non Af Amer: >60 mL/min (ref >60)
Glucose, Bld: 188 mg/dL — ABNORMAL HIGH (ref 65–99)
Potassium: 4.4 mmol/L (ref 3.5–5.1)
Sodium: 136 mmol/L (ref 135–145)

## 2017-04-30 LAB — GLUCOSE, CAPILLARY
GLUCOSE-CAPILLARY: 197 mg/dL — AB (ref 65–99)
Glucose-Capillary: 210 mg/dL — ABNORMAL HIGH (ref 65–99)
Glucose-Capillary: 253 mg/dL — ABNORMAL HIGH (ref 65–99)
Glucose-Capillary: 199 mg/dL — ABNORMAL HIGH (ref 65–99)

## 2017-04-30 MED ORDER — METHYLPREDNISOLONE SODIUM SUCC 40 MG IJ SOLR: 40.0000 mg | Freq: Every day | INTRAMUSCULAR | Status: DC

## 2017-04-30 NOTE — Telephone Encounter (Signed)
We have attempted to contact the pt several times with no success or call back from the pt. Per triage protocol, message will be closed.  

## 2017-04-30 NOTE — Progress Notes (Addendum)
PROGRESS NOTE    Taylor [name]  OPF:[id number] DOB: [birth year] DOA: 04/27/2017 PCP: [name], NP    Brief Narrative:  62 year old male who presented with dyspnea.  He does have the significant past medical history of of COPD with chronic hypoxic respiratory failure, chronic tobacco abuse, bipolar disorder, adenocarcinoma of the colon and history of prostate cancer.  Patient developed worsening dyspnea for the last 24 hours prior to hospitalization.  On the initial physical examination, blood pressure 117/84, heart rate 97, respiratory 27, oxygen saturation 100% on submental oxygen per BiPAP.  Moist mucous membranes, lungs with bilateral expiratory wheezing, heart S1-S2 present rhythmic, abdomen soft nontender, no lower extremity edema.  Sodium 142, potassium 4.2, chloride 97, bicarb 23, glucose 109, BUN 18, creatinine 0.70, white count 8.0, hemoglobin 12.6, hematocrit 40.8, platelets 183.  Chest x-ray with significant hyperinflation, no infiltrates.  EKG sinus rhythm, left axis deviation, normal intervals.  Patient was admitted to the hospital with the working diagnosis of acute on chronic hypoxic respiratory failure due to COPD exacerbation.   Assessment & Plan:   Principal Problem:   Acute respiratory failure with hypoxia (HCC) Active Problems:   Colon cancer, sigmoid   Depression   COPD exacerbation (HCC)   COPD with acute exacerbation ([place])   1.  COPD exacerbation with acute on chronic hypoxic respiratory failure. Patient clinically improving, will continue systemic steroids, bronchodilator therapy, oxymetry monitoring and supplemental 02 per [place]. Out of bed and physical therapy evaluation in preparation for possible discharge in am. Continue budesonide, decrease systemic steroids.   2.  Bipolar disorder. No confusion or agitation. Continue ariprazole and escitalopram,   3.  Dyslipidemia. Continue statin therapy   4.  History of adenocarcinoma of the colon. Stable,  follow as outpatient  5. T2DM. Will continue glucose cover and monitoring with insulin sliding scale, tolerating po well, capillary glucose 178, 258, 210, 197.   DVT prophylaxis: enoxaparin   Code Status:  full Family Communication: no family at the bedside  Disposition Plan: home   Consultants:     Procedures:     Antimicrobials:       Subjective: Dyspnea continue to improve, not yet at baseline, no chest pain or significant cough, no nausea or vomiting, has been out of bed. At home on supplemental 02 per [place].   Objective: Vitals:   04/29/17 2355 04/30/17 0427 04/30/17 0839 04/30/17 0842  BP: (!) 145/89 140/87  139/76  Pulse: 85 89  89  Resp:    (!) 24  Temp: 98.5 F (36.9 C) 98.6 F (37 C)  (!) 97.3 F (36.3 C)  TempSrc: Oral Oral  Axillary  SpO2: 92% 93% 93% 95%  Weight:      Height:        Intake/Output Summary (Last 24 hours) at 04/30/2017 0913 Last data filed at 04/30/2017 0800 Gross per 24 hour  Intake 640 ml  Output 2525 ml  Net -1885 ml   Filed Weights   04/28/17 0010 04/28/17 1456  Weight: 74.8 kg (165 lb) 74 kg (163 lb 2.3 oz)    Examination:   General: Not in pain or dyspnea  Neurology: Awake and alert, non focal  E ENT: no pallor, no icterus, oral mucosa moist Cardiovascular: No JVD. S1-S2 present, rhythmic, no gallops, rubs, or murmurs. No lower extremity edema. Pulmonary: decreased breath sounds bilaterally, decreased air movement, no wheezing, rhonchi or rales. Gastrointestinal. Abdomen flat, no organomegaly, non tender, no rebound or guarding Skin. No rashes Musculoskeletal: no  joint deformities     Data Reviewed: I have personally reviewed following labs and imaging studies  CBC: Recent Labs  Lab 04/28/17 0034 04/28/17 0044 04/28/17 0357 04/29/17 0242 04/30/17 0255  WBC 8.0  --  8.6 16.5* 13.7*  NEUTROABS 5.0  --   --  15.3* 12.5*  HGB 12.6* 12.6* 12.3* 12.1* 12.6*  HCT 40.8 37.0* 39.2 37.6* 39.0  MCV 98.3  --  97.5 95.2  95.4  PLT 183  --  179 192 675   Basic Metabolic Panel: Recent Labs  Lab 04/28/17 0034 04/28/17 0044 04/28/17 0357 04/29/17 0242 04/30/17 0255  NA  --  142 141 133* 136  K  --  4.2 3.9 4.5 4.4  CL  --  97* 98* 95* 94*  CO2  --   --  33* 28 30  GLUCOSE  --  109* 249* 293* 188*  BUN  --  18 14 18 17   CREATININE  --  0.70 0.71 0.88 0.80  CALCIUM  --   --  8.4* 8.5* 8.6*  MG 2.2  --   --   --   --    GFR: Estimated Creatinine Clearance: 100.2 mL/min (by C-G formula based on SCr of 0.8 mg/dL). Liver Function Tests: No results for input(s): AST, ALT, ALKPHOS, BILITOT, PROT, ALBUMIN in the last 168 hours. No results for input(s): LIPASE, AMYLASE in the last 168 hours. No results for input(s): AMMONIA in the last 168 hours. Coagulation Profile: No results for input(s): INR, PROTIME in the last 168 hours. Cardiac Enzymes: No results for input(s): CKTOTAL, CKMB, CKMBINDEX, TROPONINI in the last 168 hours. BNP (last 3 results) No results for input(s): PROBNP in the last 8760 hours. HbA1C: Recent Labs    04/29/17 0242  HGBA1C 6.5*   CBG: Recent Labs  Lab 04/29/17 0801 04/29/17 1132 04/29/17 1654 04/29/17 2153 04/30/17 0744  GLUCAP 178* 178* 258* 201* 210*   Lipid Profile: Recent Labs    04/29/17 0242  CHOL 185  HDL 66  LDLCALC 100*  TRIG 97  CHOLHDL 2.8   Thyroid Function Tests: No results for input(s): TSH, T4TOTAL, FREET4, T3FREE, THYROIDAB in the last 72 hours. Anemia Panel: No results for input(s): VITAMINB12, FOLATE, FERRITIN, TIBC, IRON, RETICCTPCT in the last 72 hours.    Radiology Studies: I have reviewed all of the imaging during this hospital visit personally     Scheduled Meds: . ARIPiprazole  5 mg Oral Daily  . budesonide (PULMICORT) nebulizer solution  0.25 mg Nebulization BID  . enoxaparin (LOVENOX) injection  40 mg Subcutaneous Q24H  . escitalopram  20 mg Oral Daily  . insulin aspart  0-15 Units Subcutaneous TID WC  .  ipratropium-albuterol  3 mL Nebulization BID  . methylPREDNISolone (SOLU-MEDROL) injection  40 mg Intravenous Q12H  . nicotine  14 mg Transdermal Daily  . pravastatin  20 mg Oral q1800   Continuous Infusions:   LOS: 2 days        Mauricio Gerome Apley, MD [facility] Pager [phone]

## 2017-04-30 NOTE — Plan of Care (Signed)
Pt resting comfortably. Appears to be progressing in all areas.

## 2017-05-01 LAB — GLUCOSE, CAPILLARY
Glucose-Capillary: 124 mg/dL — ABNORMAL HIGH (ref 65–99)
Glucose-Capillary: 109 mg/dL — ABNORMAL HIGH (ref 65–99)

## 2017-05-01 MED ORDER — PREDNISONE 20 MG PO TABS: ORAL_TABLET | ORAL | 0 refills | Status: DC

## 2017-05-01 MED ORDER — TIOTROPIUM BROMIDE MONOHYDRATE 18 MCG IN CAPS
18.0000 ug | ORAL_CAPSULE | Freq: Every day | RESPIRATORY_TRACT | Status: DC
  Administered 2017-05-01: 18 ug via RESPIRATORY_TRACT
  Filled 2017-05-01: qty 5

## 2017-05-01 MED ORDER — FLUTICASONE PROPIONATE HFA 110 MCG/ACT IN AERO: 2.0000 | INHALATION_SPRAY | Freq: Two times a day (BID) | RESPIRATORY_TRACT | 0 refills | Status: DC

## 2017-05-01 MED ORDER — ALBUTEROL SULFATE (2.5 MG/3ML) 0.083% IN NEBU: 2.5000 mg | INHALATION_SOLUTION | Freq: Four times a day (QID) | RESPIRATORY_TRACT | 0 refills | Status: DC | PRN

## 2017-05-01 MED ORDER — TIOTROPIUM BROMIDE MONOHYDRATE 18 MCG IN CAPS: 18.0000 ug | ORAL_CAPSULE | Freq: Every day | RESPIRATORY_TRACT | 0 refills | Status: DC

## 2017-05-01 MED ORDER — PREDNISONE 20 MG PO TABS
40.0000 mg | ORAL_TABLET | Freq: Every day | ORAL | Status: DC
  Administered 2017-05-01: 40 mg via ORAL
  Filled 2017-05-01: qty 2

## 2017-05-01 MED ORDER — IPRATROPIUM-ALBUTEROL 0.5-2.5 (3) MG/3ML IN SOLN: 3.0000 mL | Freq: Four times a day (QID) | RESPIRATORY_TRACT | 0 refills | Status: DC | PRN

## 2017-05-01 MED FILL — SPIRIVA 18 MCG CP-HANDIHALE: 18 | 30 days supply | Qty: 30 | Fill #0

## 2017-05-01 MED FILL — FLOVENT HFA 110 MCG INHALER: 110 | 30 days supply | Qty: 12 | Fill #0

## 2017-05-01 MED FILL — predniSONE 20 MG TABS: 20 | 4 days supply | Qty: 6 | Fill #0

## 2017-05-01 MED FILL — ALBUTEROL 0.083% INHAL SOLN: (2.5 MG/3ML | 25 days supply | Qty: 300 | Fill #0

## 2017-05-01 NOTE — Plan of Care (Signed)
Pt is calm and cooperative with care, pt appears to be progressing in all areas.

## 2017-05-01 NOTE — Evaluation (Signed)
Physical Therapy Evaluation Patient Details Name: Taylor [name] MRN: [id number] DOB: [birth year] Today's Date: 05/01/2017   History of Present Illness  Pt is a 62 y/o male admitted secondary to SOB and found to have a COPD exacerbation. PMH including but not limited Bipolar disorder, COPD and cancer.    Clinical Impression  Pt presented supine in bed with HOB elevated, awake and willing to participate in therapy session. Prior to admission, pt reported that he was independent with all functional mobility and ADLs. Pt stated that he lives with four roommates in a "boarding house". Pt currently able to perform bed mobility at modified independence, transfers with supervision and ambulate within his room with supervision without use of an AD. Pt on 2L of supplemental O2 throughout with SPO2 maintaining >90%. Pt stated that he feels he is an his baseline in regards to his functional mobility and breathing. PT will continue to follow acutely to progress mobility as tolerated and to ensure a safe d/c home.     Follow Up Recommendations No PT follow up    Equipment Recommendations  None recommended by PT    Recommendations for Other Services       Precautions / Restrictions Precautions Precautions: None Restrictions Weight Bearing Restrictions: No      Mobility  Bed Mobility Overal bed mobility: Modified Independent                Transfers Overall transfer level: Needs assistance Equipment used: None Transfers: Sit to/from Stand Sit to Stand: Supervision         General transfer comment: no instability or LOB, supervision for safety  Ambulation/Gait Ambulation/Gait assistance: Supervision Ambulation Distance (Feet): 25 Feet Assistive device: None Gait Pattern/deviations: Step-through pattern Gait velocity: decreased Gait velocity interpretation: Below normal speed for age/gender General Gait Details: no instability or LOB with ambulation within his room; pt on  2L and SPO2 remained >90% during all activity  Stairs            Wheelchair Mobility    Modified Rankin (Stroke Patients Only)       Balance Overall balance assessment: Needs assistance Sitting-balance support: Feet supported Sitting balance-Leahy Scale: Good     Standing balance support: During functional activity;No upper extremity supported Standing balance-Leahy Scale: Good                               Pertinent Vitals/Pain Pain Assessment: No/denies pain    Home Living Family/patient expects to be discharged to:: Other (Comment)("Boarding House") Living Arrangements: Non-relatives/Friends               Additional Comments: ramped entrance, no stairs inside, walk-in shower    Prior Function Level of Independence: Independent         Comments: pt reported that he uses 2-2.5L of supplemental O2 at home     Hand Dominance        Extremity/Trunk Assessment   Upper Extremity Assessment Upper Extremity Assessment: Overall WFL for tasks assessed    Lower Extremity Assessment Lower Extremity Assessment: Overall WFL for tasks assessed       Communication   Communication: No difficulties  Cognition Arousal/Alertness: Awake/alert Behavior During Therapy: WFL for tasks assessed/performed Overall Cognitive Status: Within Functional Limits for tasks assessed  General Comments      Exercises     Assessment/Plan    PT Assessment Patient needs continued PT services  PT Problem List Decreased mobility;Decreased coordination;Cardiopulmonary status limiting activity       PT Treatment Interventions DME instruction;Gait training;Therapeutic activities;Stair training;Functional mobility training;Therapeutic exercise;Balance training;Neuromuscular re-education;Patient/family education    PT Goals (Current goals can be found in the Care Plan section)  Acute Rehab PT Goals Patient  Stated Goal: return home PT Goal Formulation: With patient Time For Goal Achievement: 05/15/17 Potential to Achieve Goals: Good    Frequency Min 3X/week   Barriers to discharge        Co-evaluation               AM-PAC PT "6 Clicks" Daily Activity  Outcome Measure Difficulty turning over in bed (including adjusting bedclothes, sheets and blankets)?: None Difficulty moving from lying on back to sitting on the side of the bed? : None Difficulty sitting down on and standing up from a chair with arms (e.g., wheelchair, bedside commode, etc,.)?: None Help needed moving to and from a bed to chair (including a wheelchair)?: None Help needed walking in hospital room?: None Help needed climbing 3-5 steps with a railing? : A Little 6 Click Score: 23    End of Session Equipment Utilized During Treatment: Oxygen Activity Tolerance: Patient tolerated treatment well Patient left: in chair;with call bell/phone within reach Nurse Communication: Mobility status PT Visit Diagnosis: Other abnormalities of gait and mobility (R26.89)    Time: 3662-9476 PT Time Calculation (min) (ACUTE ONLY): 13 min   Charges:   PT Evaluation $PT Eval Moderate Complexity: 1 Mod     PT G Codes:        [address], PT, DPT [place] 05/01/2017, 1:49 PM

## 2017-05-01 NOTE — Care Management (Addendum)
Update 2:28pm:  Per attending Dr Cathlean Sauer pt will not need continuous oxygen - still only 2.5 liters with exertion and eating (per [place] with oxygen at [facility] eating is considered exertion).  As a FYI; CM faxed nurse note regarding oxygen saturation documented at 1:03pm today to [phone].  CMA picked up pts new medications from [place] and medications were delivered to pt by CM.  CM spoke with Pts NP and [facility] and informed of new medications on discharge.  Per pt he does not yet [facility] - has appt coming up.   CM spoke with pt; pt confirmed he has working oxygen concentrator in the home - only request is for portable oxygen tanks - CM spoke with Caryl Pina [facility] - she is arranging for portable oxygen tanks to be delivered to pts home today.  Pt is on 2.5 liters with exertion at baseline in the home and will not need new order unless oxygen needs have changed.   CM received written confirmation from [facility] with [facility] that medicaid is indeed active at this time - per NP pt will be able to fill medicaid preferred medications at discharge with information provided.  CM provided attending the preferred list per [facility] - attending will reconcile discharge medications with medications on the preferred list to facilitate discharge today.  Pts NP will assist pt post discharge with prior auths if deemed necessary.   CM confirmed with Health department that pt paid for and picked up albuterol neb vials on 4/2

## 2017-05-01 NOTE — Discharge Summary (Addendum)
Physician Discharge Summary  Taylor [name] ZDG:[id number] DOB: 1956/01/03 DOA: 04/27/2017  PCP: [name], NP  Admit date: 04/27/2017 Discharge date: 05/01/2017  Admitted From: Home Disposition:  Home  Recommendations for Outpatient Follow-up and new medication changes:  1. Follow up with PCP in 1- week 2. Patient will continue prednisone taper 3. Resume home supplemental 02 per [place] 2 LPM on exertion, including eating.  4. Continue albuterol inh 5. Added long acting cholinergic with tiotropium.  6. Added inhaled fluticasone bid  Medications matched with Medicaid preferred list.   Home Health: no   Equipment/Devices: home 02   Discharge Condition: stable CODE STATUS: full  Diet recommendation: Heart healthy   Brief/Interim Summary: 62 year old male who presented with dyspnea.  He does have the significant past medical history of of COPD with chronic hypoxic respiratory failure, chronic tobacco abuse, bipolar disorder, adenocarcinoma of the colon and history of prostate cancer.  Patient developed worsening dyspnea for the last 24 hours prior to hospitalization.  On the initial physical examination, blood pressure 117/84, heart rate 97, respiratory rate 27, oxygen saturation 100% on supplemental oxygen per BiPAP.  Moist mucous membranes, lungs with bilateral expiratory wheezing, heart S1-S2 present rhythmic, abdomen soft nontender, no lower extremity edema.  Sodium 142, potassium 4.2, chloride 97, bicarb 23, glucose 109, BUN 18, creatinine 0.70, white count 8.0, hemoglobin 12.6, hematocrit 40.8, platelets 183.  Chest x-ray with significant hyperinflation, no infiltrates.  EKG sinus rhythm, left axis deviation, normal intervals.  Patient was admitted to the hospital with the working diagnosis of acute on chronic hypoxic respiratory failure due to COPD exacerbation.  1.  Acute on chronic hypoxic respiratory failure due to COPD exacerbation.  Patient was admitted to the stepdown  unit, he received noninvasive mechanical ventilation, systemic steroids and aggressive bronchodilators. He responded well to medical therapy, successfully transitioned to nasal cannula.  Patient will continue bronchodilatros, taper prednisone.  Social services have been consulted for discharge planning. Oxygen saturation at discharge 95% on 2.5 L/m supplemental oxygen.   2.  Bipolar disorder.  Patient was continued ariprazole and escitalopram.  No agitation or confusion.   3. Dyslipidemia. Patient was continued on statin therapy with no complications.   4. Hyperglycemia. Steroid induced, patient was placed on insulin sliding scale for glucose cover and monitoring.   5. Adenocarcinoma of the colon. Follow-up as an outpatient.  Discharge Diagnoses:  Principal Problem:   Acute respiratory failure with hypoxia [facility]) Active Problems:   Colon cancer, sigmoid   Depression   COPD exacerbation (HCC)   COPD with acute exacerbation ([place])    Discharge Instructions   Allergies as of 05/01/2017      Reactions   Codeine Nausea And Vomiting      Medication List    STOP taking these medications   Fluticasone-Umeclidin-Vilant 100-62.5-25 MCG/INH Aepb Commonly known as:  TRELEGY ELLIPTA   ipratropium 0.02 % nebulizer solution Commonly known as:  ATROVENT     TAKE these medications   albuterol (2.5 MG/3ML) 0.083% nebulizer solution Commonly known as:  PROVENTIL Take 3 mLs (2.5 mg total) by nebulization every 6 (six) hours as needed for wheezing or shortness of breath. Dx: J44.9 What changed:    when to take this  reasons to take this  Another medication with the same name was removed. Continue taking this medication, and follow the directions you see here.   ARIPiprazole 5 MG tablet Commonly known as:  ABILIFY Take 1 tablet (5 mg total) by mouth every morning.  escitalopram 20 MG tablet Commonly known as:  LEXAPRO Take 1 tablet (20 mg total) by mouth every morning.    fluticasone 110 MCG/ACT inhaler Commonly known as:  FLOVENT HFA Inhale 2 puffs into the lungs 2 (two) times daily.   lovastatin 20 MG tablet Commonly known as:  MEVACOR Take 1 tablet (20 mg total) by mouth at bedtime.   OXYGEN Inhale 2 L into the lungs.   predniSONE 20 MG tablet Commonly known as:  DELTASONE Take 2 tablets daily for two days, then take 1 tablet daily for two days. What changed:  additional instructions   tiotropium 18 MCG inhalation capsule Commonly known as:  SPIRIVA Place 1 capsule (18 mcg total) into inhaler and inhale daily.      Follow-up Information    [name], [name], NP Follow up on 05/26/2017.   Why:  at 1:30pm.  Contact information: [address] [place] [address] [phone]          Allergies  Allergen Reactions  . Codeine Nausea And Vomiting    Consultations:     Procedures/Studies: Dg Chest 2 View  Result Date: 04/18/2017 CLINICAL DATA:  62 year old male with shortness of breath and COPD. EXAM: CHEST - 2 VIEW COMPARISON:  Chest radiograph dated 04/05/2017 FINDINGS: The lungs are clear. There is no pleural effusion or pneumothorax. There is hyperexpansion of the lungs with flattening of the diaphragms, likely related to air trapping and COPD. The cardiac silhouette is within normal limits. Old healed right posterior rib fractures. No acute osseous pathology. IMPRESSION: No active cardiopulmonary disease. Electronically Signed   By: Anner Crete M.D.   On: 04/18/2017 06:29   Ct Abdomen Pelvis W Contrast  Result Date: 04/18/2017 CLINICAL DATA:  Lower abdominal pain. Previous history of colon cancer post sigmoid colectomy September 2018. EXAM: CT ABDOMEN AND PELVIS WITH CONTRAST TECHNIQUE: Multidetector CT imaging of the abdomen and pelvis was performed using the standard protocol following bolus administration of intravenous contrast. CONTRAST:  132mL ISOVUE-300 IOPAMIDOL (ISOVUE-300) INJECTION 61% COMPARISON:  10/18/2016  and 05/22/2016 as well as 04/15/2012 FINDINGS: Lower chest: There is a 6 x 9 mm irregular nodule over the posterior left lower lobe. There are 3 other smaller subcentimeter nodules over the left lower lobe. These nodules are new. Right base is normal. No effusion. Hepatobiliary: Normal. Pancreas: Normal. Spleen: Normal. Adrenals/Urinary Tract: Adrenal glands are normal. Kidneys normal size without hydronephrosis or nephrolithiasis. 1 cm hypodensity over the mid to lower pole right kidney as well as 1 cm hypodensity over the mid to lower pole left kidney compatible with small cysts. Ureters and bladder are normal. Stomach/Bowel: Stomach and small bowel normal. Appendix is normal. Colon is unremarkable. Vascular/Lymphatic: Minimal calcified plaque over the abdominal aorta and iliac arteries. No adenopathy. Reproductive: Normal. Other: No free fluid or focal inflammatory change. Musculoskeletal: Degenerative change of the spine. Subtle anterior wedging of T12 which Corrales node over the superior endplate unchanged. Schmorl's node over the superior endplate of U13. IMPRESSION: No acute findings in the abdomen/pelvis. 9 x 6 mm irregular nodule over the left lower lobe with a few smaller subcentimeter nodules in the left lower lobe which are new. Findings may be due to metastatic disease in this patient with known colon cancer, although may represent an infectious or inflammatory process. Recommend follow-up CT 4-6 weeks. Small bilateral renal cysts. Aortic Atherosclerosis (ICD10-I70.0). Electronically Signed   By: Marin Olp M.D.   On: 04/18/2017 07:56   Dg Chest Portable 1 View  Result Date: 04/28/2017 CLINICAL DATA:  Shortness of breath tonight EXAM: PORTABLE CHEST 1 VIEW COMPARISON:  04/18/2017 FINDINGS: Emphysematous changes and scattered fibrosis in the lungs. No airspace disease or consolidation. No blunting of costophrenic angles. No pneumothorax. Mediastinal contours appear intact. Heart size and pulmonary  vascularity are normal. Old right rib fractures. IMPRESSION: Emphysematous changes in the lungs. No evidence of active pulmonary disease. Electronically Signed   By: Lucienne Capers M.D.   On: 04/28/2017 00:43   Dg Chest Port 1 View  Result Date: 04/06/2017 CLINICAL DATA:  Dyspnea.  COPD. EXAM: PORTABLE CHEST 1 VIEW COMPARISON:  03/27/2017 chest radiograph. FINDINGS: Stable cardiomediastinal silhouette with normal heart size. No pneumothorax. No pleural effusion. Hyperinflated lungs and emphysema. No pulmonary edema. No acute consolidative airspace disease. Stable healed upper right rib deformities. IMPRESSION: 1. Hyperinflated lungs and emphysema, compatible with COPD. 2. No acute cardiopulmonary disease. Electronically Signed   By: Ilona Sorrel M.D.   On: 04/06/2017 00:07       Subjective: Patient is feeling better, dyspnea back to baseline, no nausea or vomiting, no chest pain. At home on 2,5 LPM of supplemental 02 per [place].   Discharge Exam: Vitals:   05/01/17 0728 05/01/17 0822  BP: (!) 130/102 126/78  Pulse: 83 85  Resp: 20 19  Temp:  97.7 F (36.5 C)  SpO2: 94% 96%   Vitals:   05/01/17 0457 05/01/17 0500 05/01/17 0728 05/01/17 0822  BP: (!) 139/96  (!) 130/102 126/78  Pulse: 75  83 85  Resp: (!) 24  20 19   Temp: 98 F (36.7 C)   97.7 F (36.5 C)  TempSrc: Oral   Oral  SpO2:  93% 94% 96%  Weight:      Height:        General: Not in pain or dyspnea, deconditioned  Neurology: Awake and alert, non focal  E ENT: no pallor, no icterus, oral mucosa moist Cardiovascular: No JVD. S1-S2 present, rhythmic, no gallops, rubs, or murmurs. No lower extremity edema. Pulmonary: decreased breath sounds bilaterally, no wheezing, rhonchi or rales. Gastrointestinal. Abdomen flat, no organomegaly, non tender, no rebound or guarding Skin. No rashes Musculoskeletal: no joint deformities   The results of significant diagnostics from this hospitalization (including imaging, microbiology,  ancillary and laboratory) are listed below for reference.     Microbiology: Recent Results (from the past 240 hour(s))  MRSA PCR Screening     Status: None   Collection Time: 04/28/17  3:19 PM  Result Value Ref Range Status   MRSA by PCR NEGATIVE NEGATIVE Final    Comment:        The GeneXpert MRSA Assay (FDA approved for NASAL specimens only), is one component of a comprehensive MRSA colonization surveillance program. It is not intended to diagnose MRSA infection nor to guide or monitor treatment for MRSA infections. Performed at [facility], [place] [address] [place], [place] [address]      Labs: BNP (last 3 results) Recent Labs    12/12/16 1248 04/05/17 2241  BNP 63.3 54.2   Basic Metabolic Panel: Recent Labs  Lab 04/28/17 0034 04/28/17 0044 04/28/17 0357 04/29/17 0242 04/30/17 0255  NA  --  142 141 133* 136  K  --  4.2 3.9 4.5 4.4  CL  --  97* 98* 95* 94*  CO2  --   --  33* 28 30  GLUCOSE  --  109* 249* 293* 188*  BUN  --  18 14 18  17  CREATININE  --  0.70 0.71 0.88 0.80  CALCIUM  --   --  8.4* 8.5* 8.6*  MG 2.2  --   --   --   --    Liver Function Tests: No results for input(s): AST, ALT, ALKPHOS, BILITOT, PROT, ALBUMIN in the last 168 hours. No results for input(s): LIPASE, AMYLASE in the last 168 hours. No results for input(s): AMMONIA in the last 168 hours. CBC: Recent Labs  Lab 04/28/17 0034 04/28/17 0044 04/28/17 0357 04/29/17 0242 04/30/17 0255  WBC 8.0  --  8.6 16.5* 13.7*  NEUTROABS 5.0  --   --  15.3* 12.5*  HGB 12.6* 12.6* 12.3* 12.1* 12.6*  HCT 40.8 37.0* 39.2 37.6* 39.0  MCV 98.3  --  97.5 95.2 95.4  PLT 183  --  179 192 200   Cardiac Enzymes: No results for input(s): CKTOTAL, CKMB, CKMBINDEX, TROPONINI in the last 168 hours. BNP: Invalid input(s): POCBNP CBG: Recent Labs  Lab 04/30/17 0744 04/30/17 1146 04/30/17 1727 04/30/17 2107 05/01/17 0735  GLUCAP 210* 197* 253* 199* 124*   D-Dimer No results for input(s):  DDIMER in the last 72 hours. Hgb A1c Recent Labs    04/29/17 0242  HGBA1C 6.5*   Lipid Profile Recent Labs    04/29/17 0242  CHOL 185  HDL 66  LDLCALC 100*  TRIG 97  CHOLHDL 2.8   Thyroid function studies No results for input(s): TSH, T4TOTAL, T3FREE, THYROIDAB in the last 72 hours.  Invalid input(s): FREET3 Anemia work up No results for input(s): VITAMINB12, FOLATE, FERRITIN, TIBC, IRON, RETICCTPCT in the last 72 hours. Urinalysis    Component Value Date/Time   COLORURINE YELLOW 04/18/2017 0809   APPEARANCEUR CLEAR 04/18/2017 0809   LABSPEC 1.023 04/18/2017 0809   PHURINE 7.0 04/18/2017 0809   GLUCOSEU >=500 (A) 04/18/2017 0809   HGBUR NEGATIVE 04/18/2017 0809   BILIRUBINUR NEGATIVE 04/18/2017 0809   KETONESUR 5 (A) 04/18/2017 0809   PROTEINUR NEGATIVE 04/18/2017 0809   UROBILINOGEN 1.0 01/09/2013 1843   NITRITE NEGATIVE 04/18/2017 0809   LEUKOCYTESUR NEGATIVE 04/18/2017 0809   Sepsis Labs Invalid input(s): PROCALCITONIN,  WBC,  LACTICIDVEN Microbiology Recent Results (from the past 240 hour(s))  MRSA PCR Screening     Status: None   Collection Time: 04/28/17  3:19 PM  Result Value Ref Range Status   MRSA by PCR NEGATIVE NEGATIVE Final    Comment:        The GeneXpert MRSA Assay (FDA approved for NASAL specimens only), is one component of a comprehensive MRSA colonization surveillance program. It is not intended to diagnose MRSA infection nor to guide or monitor treatment for MRSA infections. Performed at [facility], [place] [address], [place], [place] [address]      Time coordinating discharge: 45 minutes  SIGNED:   Tawni Millers, MD  [facility] 05/01/2017, 8:23 AM Pager [phone]  If 7PM-7AM, please contact night-coverage [email or web address] Password [email or web address]

## 2017-05-01 NOTE — Progress Notes (Signed)
**Note -Identified via Obfuscation** Patient removed from oxygen therapy while lying in bed at rest; O2 saturation 90-95%. Patient's oxygen saturation dropped to 87% while patient was eating; MD aware. Patient instructed to utilize oxygen on exertion and when eating. Oxygen level 93-95% on 2.5L oxygen. Fall/safety precautions implemented.

## 2017-05-01 NOTE — Progress Notes (Signed)
Patient will discharge to [facility] Anticipated discharge date: 4/11 Transportation by [place] signing off.  Jorge Ny, LCSW Clinical Social Worker [phone]

## 2017-05-13 ENCOUNTER — Encounter (HOSPITAL_COMMUNITY): Payer: Self-pay | Admitting: Emergency Medicine

## 2017-05-13 ENCOUNTER — Other Ambulatory Visit: Payer: Self-pay

## 2017-05-13 ENCOUNTER — Inpatient Hospital Stay (HOSPITAL_COMMUNITY)
Admission: EM | Admit: 2017-05-13 | Discharge: 2017-05-18 | DRG: 190 | Disposition: A | Discharge status: Home / Self Care | Payer: Medicaid Other | Attending: Family Medicine | Admitting: Family Medicine

## 2017-05-13 ENCOUNTER — Emergency Department (HOSPITAL_COMMUNITY): Payer: Medicaid Other

## 2017-05-13 DIAGNOSIS — Z8546 Personal history of malignant neoplasm of prostate: Secondary | ICD-10-CM | POA: Diagnosis not present

## 2017-05-13 DIAGNOSIS — E870 Hyperosmolality and hypernatremia: Secondary | ICD-10-CM | POA: Diagnosis not present

## 2017-05-13 DIAGNOSIS — Z7951 Long term (current) use of inhaled steroids: Secondary | ICD-10-CM

## 2017-05-13 DIAGNOSIS — Z720 Tobacco use: Secondary | ICD-10-CM

## 2017-05-13 DIAGNOSIS — F319 Bipolar disorder, unspecified: Secondary | ICD-10-CM | POA: Diagnosis present

## 2017-05-13 DIAGNOSIS — Z79899 Other long term (current) drug therapy: Secondary | ICD-10-CM

## 2017-05-13 DIAGNOSIS — J441 Chronic obstructive pulmonary disease with (acute) exacerbation: Principal | ICD-10-CM | POA: Diagnosis present

## 2017-05-13 DIAGNOSIS — Z85038 Personal history of other malignant neoplasm of large intestine: Secondary | ICD-10-CM | POA: Diagnosis not present

## 2017-05-13 DIAGNOSIS — Z9221 Personal history of antineoplastic chemotherapy: Secondary | ICD-10-CM | POA: Diagnosis not present

## 2017-05-13 DIAGNOSIS — J9621 Acute and chronic respiratory failure with hypoxia: Secondary | ICD-10-CM | POA: Diagnosis present

## 2017-05-13 DIAGNOSIS — T380X5A Adverse effect of glucocorticoids and synthetic analogues, initial encounter: Secondary | ICD-10-CM | POA: Diagnosis present

## 2017-05-13 DIAGNOSIS — I10 Essential (primary) hypertension: Secondary | ICD-10-CM | POA: Diagnosis present

## 2017-05-13 DIAGNOSIS — Z9981 Dependence on supplemental oxygen: Secondary | ICD-10-CM | POA: Diagnosis not present

## 2017-05-13 DIAGNOSIS — J9601 Acute respiratory failure with hypoxia: Secondary | ICD-10-CM

## 2017-05-13 DIAGNOSIS — F17210 Nicotine dependence, cigarettes, uncomplicated: Secondary | ICD-10-CM | POA: Diagnosis present

## 2017-05-13 DIAGNOSIS — E785 Hyperlipidemia, unspecified: Secondary | ICD-10-CM | POA: Diagnosis present

## 2017-05-13 LAB — CBC WITH DIFFERENTIAL/PLATELET
Basophils Absolute: 0 10*3/uL (ref 0.0–0.1)
Basophils Relative: 0 %
EOS ABS: 0.3 10*3/uL (ref 0.0–0.7)
EOS PCT: 3 %
HCT: 42.3 % (ref 39.0–52.0)
Hemoglobin: 13 g/dL (ref 13.0–17.0)
LYMPHS ABS: 2.1 10*3/uL (ref 0.7–4.0)
LYMPHS PCT: 22 %
MCH: 30.9 pg (ref 26.0–34.0)
MCHC: 30.7 g/dL (ref 30.0–36.0)
MCV: 100.5 fL — AB (ref 78.0–100.0)
MONO ABS: 0.7 10*3/uL (ref 0.1–1.0)
MONOS PCT: 8 %
Neutro Abs: 6.4 10*3/uL (ref 1.7–7.7)
Neutrophils Relative %: 67 %
PLATELETS: 191 10*3/uL (ref 150–400)
RBC: 4.21 MIL/uL — ABNORMAL LOW (ref 4.22–5.81)
RDW: 14.1 % (ref 11.5–15.5)
WBC: 9.6 10*3/uL (ref 4.0–10.5)

## 2017-05-13 LAB — COMPREHENSIVE METABOLIC PANEL
ALT: 24 U/L (ref 17–63)
ANION GAP: 8 (ref 5–15)
AST: 20 U/L (ref 15–41)
Albumin: 3.6 g/dL (ref 3.5–5.0)
Alkaline Phosphatase: 56 U/L (ref 38–126)
BUN: 12 mg/dL (ref 6–20)
CHLORIDE: 102 mmol/L (ref 101–111)
CO2: 33 mmol/L — AB (ref 22–32)
Calcium: 8.4 mg/dL — ABNORMAL LOW (ref 8.9–10.3)
Creatinine, Ser: 0.8 mg/dL (ref 0.61–1.24)
Glucose, Bld: 118 mg/dL — ABNORMAL HIGH (ref 65–99)
Potassium: 4.3 mmol/L (ref 3.5–5.1)
SODIUM: 143 mmol/L (ref 135–145)
Total Bilirubin: 0.6 mg/dL (ref 0.3–1.2)
Total Protein: 5.9 g/dL — ABNORMAL LOW (ref 6.5–8.1)

## 2017-05-13 LAB — RESPIRATORY PANEL BY PCR
ADENOVIRUS-RVPPCR: NOT DETECTED
BORDETELLA PERTUSSIS-RVPCR: NOT DETECTED
CHLAMYDOPHILA PNEUMONIAE-RVPPCR: NOT DETECTED
CORONAVIRUS 229E-RVPPCR: NOT DETECTED
CORONAVIRUS HKU1-RVPPCR: NOT DETECTED
CORONAVIRUS NL63-RVPPCR: NOT DETECTED
Coronavirus OC43: NOT DETECTED
Influenza A: NOT DETECTED
Influenza B: NOT DETECTED
METAPNEUMOVIRUS-RVPPCR: NOT DETECTED
Mycoplasma pneumoniae: NOT DETECTED
PARAINFLUENZA VIRUS 2-RVPPCR: NOT DETECTED
PARAINFLUENZA VIRUS 3-RVPPCR: NOT DETECTED
Parainfluenza Virus 1: NOT DETECTED
Parainfluenza Virus 4: NOT DETECTED
Respiratory Syncytial Virus: NOT DETECTED
Rhinovirus / Enterovirus: NOT DETECTED

## 2017-05-13 LAB — PROCALCITONIN

## 2017-05-13 LAB — I-STAT TROPONIN, ED: TROPONIN I, POC: 0.01 ng/mL (ref 0.00–0.08)

## 2017-05-13 LAB — SEDIMENTATION RATE: SED RATE: 4 mm/h (ref 0–16)

## 2017-05-13 MED ORDER — SODIUM CHLORIDE 0.9 % IV SOLN
INTRAVENOUS | Status: DC
  Administered 2017-05-13: 10:00:00 via INTRAVENOUS

## 2017-05-13 MED ORDER — SODIUM CHLORIDE 0.9 % IV BOLUS
1000.0000 mL | Freq: Once | INTRAVENOUS | Status: AC
  Administered 2017-05-13: 1000 mL via INTRAVENOUS

## 2017-05-13 MED ORDER — IPRATROPIUM BROMIDE 0.02 % IN SOLN
0.5000 mg | Freq: Once | RESPIRATORY_TRACT | Status: AC
  Administered 2017-05-13: 0.5 mg via RESPIRATORY_TRACT
  Filled 2017-05-13: qty 2.5

## 2017-05-13 MED ORDER — DOXYCYCLINE HYCLATE 100 MG PO TABS
100.0000 mg | ORAL_TABLET | Freq: Two times a day (BID) | ORAL | Status: DC
  Administered 2017-05-13 – 2017-05-18 (×11): 100 mg via ORAL
  Filled 2017-05-13 (×11): qty 1

## 2017-05-13 MED ORDER — HYDRALAZINE HCL 20 MG/ML IJ SOLN: 10.0000 mg | Freq: Four times a day (QID) | INTRAMUSCULAR | Status: DC | PRN

## 2017-05-13 MED ORDER — ESCITALOPRAM OXALATE 10 MG PO TABS
20.0000 mg | ORAL_TABLET | ORAL | Status: DC
  Administered 2017-05-13 – 2017-05-18 (×6): 20 mg via ORAL
  Filled 2017-05-13 (×6): qty 2

## 2017-05-13 MED ORDER — ALBUTEROL SULFATE (2.5 MG/3ML) 0.083% IN NEBU: 2.5000 mg | INHALATION_SOLUTION | RESPIRATORY_TRACT | Status: DC | PRN

## 2017-05-13 MED ORDER — ACETAMINOPHEN 325 MG PO TABS: 650.0000 mg | ORAL_TABLET | Freq: Four times a day (QID) | ORAL | Status: DC | PRN

## 2017-05-13 MED ORDER — PRAVASTATIN SODIUM 20 MG PO TABS
20.0000 mg | ORAL_TABLET | Freq: Every day | ORAL | Status: DC
  Administered 2017-05-13 – 2017-05-17 (×5): 20 mg via ORAL
  Filled 2017-05-13 (×5): qty 1

## 2017-05-13 MED ORDER — ONDANSETRON HCL 4 MG/2ML IJ SOLN: 4.0000 mg | Freq: Four times a day (QID) | INTRAMUSCULAR | Status: DC | PRN

## 2017-05-13 MED ORDER — ACETAMINOPHEN 650 MG RE SUPP: 650.0000 mg | Freq: Four times a day (QID) | RECTAL | Status: DC | PRN

## 2017-05-13 MED ORDER — ALBUTEROL SULFATE (2.5 MG/3ML) 0.083% IN NEBU
5.0000 mg | INHALATION_SOLUTION | Freq: Once | RESPIRATORY_TRACT | Status: AC
  Administered 2017-05-13: 5 mg via RESPIRATORY_TRACT
  Filled 2017-05-13: qty 6

## 2017-05-13 MED ORDER — MOMETASONE FURO-FORMOTEROL FUM 100-5 MCG/ACT IN AERO
2.0000 | INHALATION_SPRAY | Freq: Two times a day (BID) | RESPIRATORY_TRACT | Status: DC
  Administered 2017-05-13 – 2017-05-18 (×10): 2 via RESPIRATORY_TRACT
  Filled 2017-05-13 (×2): qty 8.8

## 2017-05-13 MED ORDER — MAGNESIUM SULFATE 2 GM/50ML IV SOLN
2.0000 g | Freq: Once | INTRAVENOUS | Status: AC
  Administered 2017-05-13: 2 g via INTRAVENOUS
  Filled 2017-05-13: qty 50

## 2017-05-13 MED ORDER — SODIUM CHLORIDE 0.9% FLUSH
3.0000 mL | Freq: Two times a day (BID) | INTRAVENOUS | Status: DC
  Administered 2017-05-13 – 2017-05-18 (×10): 3 mL via INTRAVENOUS

## 2017-05-13 MED ORDER — ONDANSETRON HCL 4 MG PO TABS: 4.0000 mg | ORAL_TABLET | Freq: Four times a day (QID) | ORAL | Status: DC | PRN

## 2017-05-13 MED ORDER — ENOXAPARIN SODIUM 40 MG/0.4ML ~~LOC~~ SOLN
40.0000 mg | SUBCUTANEOUS | Status: DC
  Administered 2017-05-13 – 2017-05-18 (×6): 40 mg via SUBCUTANEOUS
  Filled 2017-05-13 (×6): qty 0.4

## 2017-05-13 MED ORDER — METHYLPREDNISOLONE SODIUM SUCC 125 MG IJ SOLR
60.0000 mg | Freq: Four times a day (QID) | INTRAMUSCULAR | Status: DC
  Administered 2017-05-13 – 2017-05-14 (×5): 60 mg via INTRAVENOUS
  Filled 2017-05-13 (×4): qty 2

## 2017-05-13 MED ORDER — LEVALBUTEROL HCL 0.63 MG/3ML IN NEBU
0.6300 mg | INHALATION_SOLUTION | Freq: Four times a day (QID) | RESPIRATORY_TRACT | Status: DC | PRN
  Administered 2017-05-15 – 2017-05-17 (×3): 0.63 mg via RESPIRATORY_TRACT
  Filled 2017-05-13 (×3): qty 3

## 2017-05-13 MED ORDER — NICOTINE 21 MG/24HR TD PT24
21.0000 mg | MEDICATED_PATCH | Freq: Every day | TRANSDERMAL | Status: DC
  Administered 2017-05-13 – 2017-05-18 (×6): 21 mg via TRANSDERMAL
  Filled 2017-05-13 (×6): qty 1

## 2017-05-13 MED ORDER — IPRATROPIUM-ALBUTEROL 0.5-2.5 (3) MG/3ML IN SOLN
3.0000 mL | Freq: Four times a day (QID) | RESPIRATORY_TRACT | Status: DC
  Administered 2017-05-13 – 2017-05-14 (×5): 3 mL via RESPIRATORY_TRACT
  Filled 2017-05-13 (×5): qty 3

## 2017-05-13 MED ORDER — ARIPIPRAZOLE 10 MG PO TABS
5.0000 mg | ORAL_TABLET | ORAL | Status: DC
  Administered 2017-05-13 – 2017-05-18 (×6): 5 mg via ORAL
  Filled 2017-05-13 (×6): qty 1

## 2017-05-13 NOTE — ED Notes (Signed)
Hospitalist at bedside 

## 2017-05-13 NOTE — ED Provider Notes (Signed)
[facility] EMERGENCY DEPARTMENT Provider Note   CSN: [id number] Arrival date & time: 05/13/17  0719     History   Chief Complaint Chief Complaint  Patient presents with  . Shortness of Breath    HPI Taylor [name] is a 62 y.o. male.  Patient is a 62 year old male with a history of COPD on 2 L of oxygen chronically, frequent COPD exacerbations, bipolar disease, alcohol abuse, former tobacco abuse presenting today with shortness of breath.  Patient states he was recently discharged from the hospital after a COPD exacerbation and about 2-3 days ago he started having worsening shortness of breath where he was using his albuterol inhaler at home.  However through the night he started feeling worse and was unable to catch his breath.  He has had a nonproductive cough as well but no fever.  He has not noted any lower extremity swelling and he has not had any abdominal pain, nausea or vomiting.  He is having tightness throughout his chest but no radiation of the discomfort.  He has been eating and drinking at home.  He finished a course of steroids within the last week but continues to use his inhalers as prescribed.  EMS reports when they arrived on the scene he was to tachypneic, significantly diminished breath sounds, accessory muscle use and was unable to speak.  They placed him on CPAP and administered 10 mg of albuterol, 125 mg of Solu-Medrol which they state has significantly improved the way he looks.  The history is provided by the patient and the EMS personnel.    Past Medical History:  Diagnosis Date  . Bipolar 1 disorder ([place]) 02/05/2012  . 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 SHERRILL  . COPD (chronic obstructive pulmonary disease) ([place])    SMOKER  . Depression    Bipolar disorder  . Full dentures   . Hemorrhoids   . Inguinal hernia    RIGHT- PAINFUL  . Prostate cancer ([place])    Gleason score = 7, supposed  to have radiation therapy but he has not followed up  . Rib fractures    hx of  . Shortness of breath     Patient Active Problem List   Diagnosis Date Noted  . Acute on chronic respiratory failure with hypoxia and hypercapnia ([place]) 04/06/2017  . Leukocytosis 04/06/2017  . Adjustment disorder with depressed mood 03/28/2017  . Adjustment disorder with mixed disturbance of emotions and conduct   . Chronic respiratory failure with hypoxia ([place]) 03/24/2017  . Normocytic normochromic anemia 03/24/2017  . COPD with acute exacerbation ([place]) 10/22/2016  . Acute bronchitis due to human metapneumovirus 01/16/2013  . Alcohol abuse 01/09/2013  . COPD exacerbation ([place]) 01/09/2013  . Chest pain 01/09/2013  . Acute respiratory failure with hypoxia ([place]) 01/09/2013  . Smoker 12/16/2012  . Inguinal hernia unilateral, non-recurrent, right 05/01/2012  . Dyspepsia 02/05/2012  . Hyperglycemia 02/04/2012  . COPD  GOLD III 02/03/2012  . Thrombocytopenia ([place]) 02/03/2012  . Depression   . 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 COLON SURGERY  04/11/11   Sigmoid colectomy  . COLOSTOMY REVISION  04/11/2011   Procedure: COLON RESECTION SIGMOID;  Surgeon: [name], MD;  Location: [facility];  Service: General;  Laterality: N/A;  low anterior colon resection   . INGUINAL HERNIA REPAIR Right 05/01/2012   Procedure: HERNIA REPAIR INGUINAL ADULT;  Surgeon: Merlinda Frederick  Gerkin, MD;  Location: [facility];  Service: General;  Laterality: Right;  . INSERTION OF MESH Right 05/01/2012   Procedure: INSERTION OF MESH;  Surgeon: [name], MD;  Location: [facility];  Service: General;  Laterality: Right;  . PORT-A-CATH REMOVAL Left 05/01/2012   Procedure: REMOVAL Infusion Port;  Surgeon: [name], MD;  Location: [facility];  Service: General;  Laterality: Left;  . PORTACATH PLACEMENT    . PORTACATH PLACEMENT  05/02/2011   Procedure: INSERTION PORT-A-CATH;  Surgeon: [name], MD;  Location: [facility];  Service: General;  Laterality: N/A;        Home Medications    Prior to Admission medications   Medication Sig Start Date End Date Taking? Authorizing Provider  albuterol (PROVENTIL) (2.5 MG/3ML) 0.083% nebulizer solution Take 3 mLs (2.5 mg total) by nebulization every 6 (six) hours as needed for wheezing or shortness of breath. Dx: J44.9 05/01/17 05/31/17  Arrien, Jimmy Picket, MD  ARIPiprazole (ABILIFY) 5 MG tablet Take 1 tablet (5 mg total) by mouth every morning. 03/27/17   Shahmehdi, Valeria Batman, MD  escitalopram (LEXAPRO) 20 MG tablet Take 1 tablet (20 mg total) by mouth every morning. 03/27/17   Shahmehdi, Valeria Batman, MD  fluticasone (FLOVENT HFA) 110 MCG/ACT inhaler Inhale 2 puffs into the lungs 2 (two) times daily. 05/01/17 06/30/17  Arrien, Jimmy Picket, MD  lovastatin (MEVACOR) 20 MG tablet Take 1 tablet (20 mg total) by mouth at bedtime. 04/09/17   Hongalgi, Lenis Dickinson, MD  OXYGEN Inhale 2 L into the lungs.    [provider]  predniSONE (DELTASONE) 20 MG tablet Take 2 tablets daily for two days, then take 1 tablet daily for two days. 05/01/17   Arrien, Jimmy Picket, MD  tiotropium (SPIRIVA) 18 MCG inhalation capsule Place 1 capsule (18 mcg total) into inhaler and inhale daily. 05/01/17 05/31/17  Arrien, Jimmy Picket, MD    Family History Family History  Problem Relation Age of Onset  . Heart disease Father   . Lung cancer Maternal Uncle        smoked    Social History Social History   Tobacco Use  . Smoking status: Former Smoker    Packs/day: 1.00    Years: 45.00    Pack years: 45.00    Types: Cigarettes    Last attempt to quit: 01/22/2016    Years since quitting: 1.3  . Smokeless tobacco: Former Network engineer Use Topics  . Alcohol use: No    Comment: h/o use in the past, no h/o heavy use  . Drug use: No     Allergies   Codeine   Review of Systems Review of Systems  All other systems reviewed and are  negative.    Physical Exam Updated Vital Signs BP 119/80   Pulse (!) 121   Temp (!) 97.4 F (36.3 C) (Axillary)   Resp (!) 24   Wt 73.9 kg (163 lb)   SpO2 100%   BMI 22.73 kg/m   Physical Exam  Constitutional: He is oriented to person, place, and time. He appears well-developed and well-nourished. No distress.  HENT:  Head: Normocephalic and atraumatic.  bipap on   Eyes: Pupils are equal, round, and reactive to light. Conjunctivae and EOM are normal.  Neck: Normal range of motion. Neck supple.  Cardiovascular: Regular rhythm and intact distal pulses. Tachycardia present.  No murmur heard. Pulmonary/Chest: Accessory muscle usage present. Tachypnea noted. No respiratory distress. He has decreased breath sounds.  He has wheezes. He has no rales.  Scant Expiratory wheezes.  Significantly diminished breath sounds throughout.  Patient is now able to speak in full sentences  Abdominal: Soft. He exhibits no distension. There is no tenderness. There is no rebound and no guarding.  Musculoskeletal: Normal range of motion. He exhibits no edema or tenderness.  Neurological: He is alert and oriented to person, place, and time.  Skin: Skin is warm and dry. Capillary refill takes less than 2 seconds. No rash noted. No erythema.  Psychiatric: He has a normal mood and affect. His behavior is normal.  Nursing note and vitals reviewed.    ED Treatments / Results  Labs (all labs ordered are listed, but only abnormal results are displayed) Labs Reviewed  CBC WITH DIFFERENTIAL/PLATELET - Abnormal; Notable for the following components:      Result Value   RBC 4.21 (*)    MCV 100.5 (*)    All other components within normal limits  COMPREHENSIVE METABOLIC PANEL - Abnormal; Notable for the following components:   CO2 33 (*)    Glucose, Bld 118 (*)    Calcium 8.4 (*)    Total Protein 5.9 (*)    All other components within normal limits  I-STAT TROPONIN, ED    EKG EKG  Interpretation  Date/Time:  Tuesday May 13 2017 07:25:53 EDT Ventricular Rate:  86 PR Interval:    QRS Duration: 85 QT Interval:  333 QTC Calculation: 399 R Axis:   69 Text Interpretation:  Sinus rhythm Short PR interval Anteroseptal infarct, age indeterminate ST elevation, consider inferior injury No significant change since last tracing Confirmed by Blanchie Dessert [phone]) on 05/13/2017 7:33:54 AM   Radiology Dg Chest Port 1 View  Result Date: 05/13/2017 CLINICAL DATA:  Shortness of breath EXAM: PORTABLE CHEST 1 VIEW COMPARISON:  April 29, 2017 FINDINGS: There is relative lucency in the upper lung zones, particular on the right, suggesting underlying bullous disease. There is no edema or consolidation. Heart size and normal. Somewhat diminished vascularity in the upper lobes is suggestive of potential underlying bullous disease. No adenopathy. There old healed posterior rib fractures on the right superiorly. IMPRESSION: Suspect upper lobe bullous disease, particular on the right. No edema or consolidation. Electronically Signed   By: Lowella Grip III M.D.   On: 05/13/2017 07:51    Procedures Procedures (including critical care time)  Medications Ordered in ED Medications  albuterol (PROVENTIL) (2.5 MG/3ML) 0.083% nebulizer solution 5 mg (has no administration in time range)  ipratropium (ATROVENT) nebulizer solution 0.5 mg (has no administration in time range)  sodium chloride 0.9 % bolus 1,000 mL (has no administration in time range)  magnesium sulfate IVPB 2 g 50 mL (has no administration in time range)     Initial Impression / Assessment and Plan / ED Course  I have reviewed the triage vital signs and the nursing notes.  Pertinent labs & imaging results that were available during my care of the patient were reviewed by me and considered in my medical decision making (see chart for details).     62 year old male presenting with severe shortness of breath with prior  history of recent admission for COPD exacerbation.  Patient is currently requiring BiPAP but is now able to speak in full sentences.  He has minimal accessory muscle use and tachypnea.  He has globally decreased breath sounds and scant wheezing.  He has chest tightness throughout but denies any symptoms that are more suggestive of ACS.  He  denies any abdominal pain or evidence of swelling in his legs.  Low suspicion that this is cardiac in nature, CHF or dissection.  He denies any unilateral leg pain or swelling.  Low suspicion for PE at this time.  Patient is improving with therapies given by EMS.  Will continue albuterol, Atrovent and will add on magnesium.  We will continue BiPAP at this time.  Also patient given a fluid bolus. EKG without acute findings.  Chest x-ray, CBC, CMP, troponin pending  9:16 AM Labs within normal limits.  Chest x-ray without acute findings.  On repeat evaluation patient is breathing more comfortably.  Mildly increased breath sounds.  Patient still on BiPAP at this time.  Will admit for further care.  CRITICAL CARE Performed by: Shterna Laramee Total critical care time: 30 minutes Critical care time was exclusive of separately billable procedures and treating other patients. Critical care was necessary to treat or prevent imminent or life-threatening deterioration. Critical care was time spent personally by me on the following activities: development of treatment plan with patient and/or surrogate as well as nursing, discussions with consultants, evaluation of patient's response to treatment, examination of patient, obtaining history from patient or surrogate, ordering and performing treatments and interventions, ordering and review of laboratory studies, ordering and review of radiographic studies, pulse oximetry and re-evaluation of patient's condition.   Final Clinical Impressions(s) / ED Diagnoses   Final diagnoses:  COPD exacerbation ([place])  Acute respiratory  failure with hypoxia [facility])    ED Discharge Orders    None       Blanchie Dessert, MD 05/13/17 [phone]

## 2017-05-13 NOTE — ED Triage Notes (Signed)
PT from home via EMS for SOB. PT had a recent COPD admission to this hospital. PT wears 2L [place] at home. EMS reports resp distress when they arrived, did not get initial O2 sat before treatment.   PT had 10 mg albuterol. 0.5 mg atrovent, 125 mg solumedrol in route.   PT arrives on CPAP

## 2017-05-13 NOTE — H&P (Signed)
History and Physical    ALEXIS MIZUNO IFO:[id number] DOB: 1955-08-10 DOA: 05/13/2017  **Will admit patient based on the expectation that the patient will need hospitalization/ hospital care that crosses at least 2 midnights  PCP: [name], [name], NP   Attending physician: Lorin Mercy  Patient coming from/Resides with: Private residence  Chief Complaint: SOB  HPI: ESTES LEHNER is a 62 y.o. male with medical history significant for COPD Gold D with chronic hypoxemia on 2 L oxygen, ongoing tobacco abuse, bipolar disorder, dyslipidemia.  Patient was recently discharged on 411 after admission for COPD exacerbation.  Since discharge she has completed Deroy taper.  He has been using his Dulera and albuterol nebs as needed.  Patient had abrupt onset of shortness of breath yesterday evening.  Upon EMS arrival he was tachypneic, unable to speak, using accessory muscles to breathe.  He was placed on CPAP and given 10 mg albuterol neb 125 mg of Solu-Medrol which significantly improved his respiratory status.  Upon arrival to the ER he was still wheezing with diminished air movement so was placed on BiPAP.  Chest x-ray without evidence of infiltrate or edema.  Labs unremarkable.  ED Course:  Vital Signs: BP 109/73   Pulse 77   Temp (!) 97.4 F (36.3 C) (Axillary)   Resp (!) 21   Wt 73.9 kg (163 lb)   SpO2 100%   BMI 22.73 kg/m  Chest x-ray: Suspected upper lobe bullous disease primarily on the right without edema or consolidation Medications and treatments.  Until 5 mg neb x1, Atrovent 0.5 mg neb x1, normal saline bolus times 1 L, magnesium 2 g IV x1  Review of Systems:  In addition to the HPI above,  No Fever-chills, myalgias or other constitutional symptoms No Headache, changes with Vision or hearing, new weakness, tingling, numbness in any extremity, dizziness, dysarthria or word finding difficulty, gait disturbance or imbalance, tremors or seizure activity No problems swallowing  food or Liquids, indigestion/reflux, choking or coughing while eating, abdominal pain with or after eating No Chest pain, Cough, palpitations, orthopnea, slight increase in chronic DOE in context of abrupt onset of symptoms yesterday evening No Abdominal pain, N/V, melena,hematochezia, dark tarry stools, constipation No dysuria, malodorous urine, hematuria or flank pain No new skin rashes, lesions, masses or bruises, No new joint pains, aches, swelling or redness No recent unintentional weight gain or loss No polyuria, polydypsia or polyphagia   Past Medical History:  Diagnosis Date  . Bipolar 1 disorder ([place]) 02/05/2012  . Cancer ([place]) 04/11/11   adenocarcinoma of colon, 7/19 nodes pos.FINISHED CHEMO/DR. SHERRILL  . COPD (chronic obstructive pulmonary disease) ([place])    SMOKER  . Full dentures   . Hemorrhoids   . Inguinal hernia    RIGHT- PAINFUL  . Prostate cancer ([place])    Gleason score = 7, supposed to have radiation therapy but he has not followed up  . Rib fractures    hx of  . Shortness of breath     Past Surgical History:  Procedure Laterality Date  . COLON SURGERY  04/11/11   Sigmoid colectomy  . COLOSTOMY REVISION  04/11/2011   Procedure: COLON RESECTION SIGMOID;  Surgeon: [name], MD;  Location: [facility];  Service: General;  Laterality: N/A;  low anterior colon resection   . INGUINAL HERNIA REPAIR Right 05/01/2012   Procedure: HERNIA REPAIR INGUINAL ADULT;  Surgeon: [name], MD;  Location: [facility];  Service: General;  Laterality: Right;  . INSERTION  OF MESH Right 05/01/2012   Procedure: INSERTION OF MESH;  Surgeon: [name], MD;  Location: [facility];  Service: General;  Laterality: Right;  . PORT-A-CATH REMOVAL Left 05/01/2012   Procedure: REMOVAL Infusion Port;  Surgeon: [name], MD;  Location: [facility];  Service: General;  Laterality: Left;  . PORTACATH PLACEMENT    . PORTACATH PLACEMENT  05/02/2011   Procedure: INSERTION PORT-A-CATH;  Surgeon: [name], MD;  Location: [facility];  Service: General;  Laterality: N/A;    Social History   Socioeconomic History  . Marital status: Divorced    Spouse name: Not on file  . Number of children: Not on file  . Years of education: Not on file  . Highest education level: Not on file  Occupational History  . Occupation: disability pending  Social Needs  . Financial resource strain: Not on file  . Food insecurity:    Worry: Not on file    Inability: Not on file  . Transportation needs:    Medical: Not on file    Non-medical: Not on file  Tobacco Use  . Smoking status: Former Smoker    Packs/day: 1.00    Years: 45.00    Pack years: 45.00    Types: Cigarettes    Last attempt to quit: 01/22/2016    Years since quitting: 1.3  . Smokeless tobacco: Former Network engineer and Sexual Activity  . Alcohol use: No    Comment: h/o use in the past, no h/o heavy use  . Drug use: No  . Sexual activity: Never  Lifestyle  . Physical activity:    Days per week: Not on file    Minutes per session: Not on file  . Stress: Not on file  Relationships  . Social connections:    Talks on phone: Not on file    Gets together: Not on file    Attends religious service: Not on file    Active member of club or organization: Not on file    Attends meetings of clubs or organizations: Not on file    Relationship status: Not on file  . Intimate partner violence:    Fear of current or ex partner: Not on file    Emotionally abused: Not on file    Physically abused: Not on file    Forced sexual activity: Not on file  Other Topics Concern  . Not on file  Social History Narrative   Lives alone-divorced, disabled, in [place] 1970's   Brother, Donzell Coller and his wife Jackelyn Poling assist   Prior occupation: Clinical biochemist    Mobility: Independent Work history: Not obtained   Allergies  Allergen Reactions  . Codeine Nausea And Vomiting    Family History  Problem Relation Age of Onset  . Heart disease Father     . Lung cancer Maternal Uncle        smoked     Prior to Admission medications   Medication Sig Start Date End Date Taking? Authorizing Provider  albuterol (PROVENTIL) (2.5 MG/3ML) 0.083% nebulizer solution Take 3 mLs (2.5 mg total) by nebulization every 6 (six) hours as needed for wheezing or shortness of breath. Dx: J44.9 05/01/17 05/31/17 Yes Arrien, Jimmy Picket, MD  ARIPiprazole (ABILIFY) 5 MG tablet Take 1 tablet (5 mg total) by mouth every morning. 03/27/17  Yes Shahmehdi, Seyed A, MD  escitalopram (LEXAPRO) 20 MG tablet Take 1 tablet (20 mg total) by mouth every morning. 03/27/17  Yes Shahmehdi, Seyed A,  MD  fluticasone (FLOVENT HFA) 110 MCG/ACT inhaler Inhale 2 puffs into the lungs 2 (two) times daily. 05/01/17 06/30/17 Yes Arrien, Jimmy Picket, MD  lovastatin (MEVACOR) 20 MG tablet Take 1 tablet (20 mg total) by mouth at bedtime. 04/09/17  Yes Hongalgi, Lenis Dickinson, MD  OXYGEN Inhale 2 L into the lungs.   Yes [provider]  tiotropium (SPIRIVA) 18 MCG inhalation capsule Place 1 capsule (18 mcg total) into inhaler and inhale daily. 05/01/17 05/31/17 Yes Arrien, Jimmy Picket, MD    Physical Exam: Vitals:   05/13/17 0815 05/13/17 0845 05/13/17 0915 05/13/17 1000  BP: 107/87 108/77 110/69 109/73  Pulse: 96 79 69 77  Resp: (!) 33 (!) 22 19 (!) 21  Temp:      TempSrc:      SpO2: 100% 98% 99% 100%  Weight:          Constitutional: NAD, calm, comfortable Eyes: PERRL, lids and conjunctivae normal ENMT: Mucous membranes are moist. Posterior pharynx clear of any exudate or lesions.Normal dentition.  Neck: normal, supple, no masses, no thyromegaly Respiratory: clear to auscultation bilaterally, no wheezing, no crackles. Normal respiratory effort. No accessory muscle use.  Cardiovascular: Regular rate and rhythm, no murmurs / rubs / gallops. No extremity edema. 2+ pedal pulses. No carotid bruits.  Abdomen: no tenderness, no masses palpated. No hepatosplenomegaly. Bowel sounds  positive.  Musculoskeletal: no clubbing / cyanosis. No joint deformity upper and lower extremities. Good ROM, no contractures. Normal muscle tone.  Skin: no rashes, lesions, ulcers. No induration Neurologic: CN 2-12 grossly intact. Sensation intact, DTR normal. Strength 5/5 x all 4 extremities.  Psychiatric: Normal judgment and insight. Alert and oriented x 3. Normal mood.    Labs on Admission: I have personally reviewed following labs and imaging studies  CBC: Recent Labs  Lab 05/13/17 0723  WBC 9.6  NEUTROABS 6.4  HGB 13.0  HCT 42.3  MCV 100.5*  PLT 810   Basic Metabolic Panel: Recent Labs  Lab 05/13/17 0723  NA 143  K 4.3  CL 102  CO2 33*  GLUCOSE 118*  BUN 12  CREATININE 0.80  CALCIUM 8.4*   GFR: Estimated Creatinine Clearance: 100.1 mL/min (by C-G formula based on SCr of 0.8 mg/dL). Liver Function Tests: Recent Labs  Lab 05/13/17 0723  AST 20  ALT 24  ALKPHOS 56  BILITOT 0.6  PROT 5.9*  ALBUMIN 3.6   No results for input(s): LIPASE, AMYLASE in the last 168 hours. No results for input(s): AMMONIA in the last 168 hours. Coagulation Profile: No results for input(s): INR, PROTIME in the last 168 hours. Cardiac Enzymes: No results for input(s): CKTOTAL, CKMB, CKMBINDEX, TROPONINI in the last 168 hours. BNP (last 3 results) No results for input(s): PROBNP in the last 8760 hours. HbA1C: No results for input(s): HGBA1C in the last 72 hours. CBG: No results for input(s): GLUCAP in the last 168 hours. Lipid Profile: No results for input(s): CHOL, HDL, LDLCALC, TRIG, CHOLHDL, LDLDIRECT in the last 72 hours. Thyroid Function Tests: No results for input(s): TSH, T4TOTAL, FREET4, T3FREE, THYROIDAB in the last 72 hours. Anemia Panel: No results for input(s): VITAMINB12, FOLATE, FERRITIN, TIBC, IRON, RETICCTPCT in the last 72 hours. Urine analysis:    Component Value Date/Time   COLORURINE YELLOW 04/18/2017 0809   APPEARANCEUR CLEAR 04/18/2017 0809   LABSPEC  1.023 04/18/2017 0809   PHURINE 7.0 04/18/2017 0809   GLUCOSEU >=500 (A) 04/18/2017 0809   HGBUR NEGATIVE 04/18/2017 0809   BILIRUBINUR NEGATIVE 04/18/2017  0809   KETONESUR 5 (A) 04/18/2017 0809   PROTEINUR NEGATIVE 04/18/2017 0809   UROBILINOGEN 1.0 01/09/2013 1843   NITRITE NEGATIVE 04/18/2017 0809   LEUKOCYTESUR NEGATIVE 04/18/2017 0809   Sepsis Labs: _0 (procalcitonin:4,lacticidven:4) )No results found for this or any previous visit (from the past 240 hour(s)).   Radiological Exams on Admission: Dg Chest Port 1 View  Result Date: 05/13/2017 CLINICAL DATA:  Shortness of breath EXAM: PORTABLE CHEST 1 VIEW COMPARISON:  April 29, 2017 FINDINGS: There is relative lucency in the upper lung zones, particular on the right, suggesting underlying bullous disease. There is no edema or consolidation. Heart size and normal. Somewhat diminished vascularity in the upper lobes is suggestive of potential underlying bullous disease. No adenopathy. There old healed posterior rib fractures on the right superiorly. IMPRESSION: Suspect upper lobe bullous disease, particular on the right. No edema or consolidation. Electronically Signed   By: Lowella Grip III M.D.   On: 05/13/2017 07:51    EKG: (Independently reviewed) sinus rhythm with ventricular rate 86 bpm, QTC 399 ms, prominent T waves in inferior lateral leads, normal R wave rotation, no acute ischemic changes  Assessment/Plan Principal Problem:   Acute on chronic respiratory failure with hypoxemia 2/2 COPD (GOLD D) with acute exacerbation  -Presents with abrupt onset of respiratory symptoms with wheezing and decreased airway movement consistent with COPD exacerbation noting negative chest x-ray. -Hypoxemia with increased work of breathing with suspected acute hypoxic respiratory failure although pulse oximetry not documented by EMS -Significantly improved since arrival to ER with improved air movement and decreased work of breathing so  have discontinued BiPAP in favor of nasal cannula oxygen with plans to wean from 4 L to baseline of 2 L -Patient currently on Dulera (mometasone/formoterol) but had better symptom control on Trelegy Ellipta (fluticasone/umeclidinium/Vilanterol) have asked case management to determine if Medicaid pays for Trelegy-us may require adjustments in other MDIs to discharge -Scheduled duo nebs with albuterol prn -IV  SoluMedrol 60 mg 6 hours -No focal infiltrate but could have viral pneumonitis so obtain ESR and pro calcitonin well as respiratory viral panel -Pulmonologist is Dr. Salena Saner is May 5  Active Problems:   Bipolar 1 disorder  -Continue preadmission Abilify and Lexapro    HTN -Not on medications prior to admission -Current blood pressure controlled    HLD (hyperlipidemia) -Continue therapeutic substitution for lovastatin    Tobacco abuse -Counseled regarding tobacco cessation -Nicotine patch    **Additional lab, imaging and/or diagnostic evaluation at discretion of supervising physician  DVT prophylaxis: Lovenox Code Status: Full Family Communication: No family at bedside Disposition Plan: Home Consults called: None    ELLIS,ALLISON L. ANP-BC [facility] Pager [phone]   If 7PM-7AM, please contact night-coverage [email or web address] Password [facility]  05/13/2017, 10:16 AM

## 2017-05-13 NOTE — ED Notes (Signed)
Attempted report x1. 

## 2017-05-13 NOTE — ED Notes (Signed)
PT's HR 130 bpm. PT denies chest pain, PT reports he feels flushed. PT reports this happens "when he wakes up." HR returns to below 100 bpm within 2 minutes. A new EKG is obtained. Neb will be started and admitting paged.

## 2017-05-13 NOTE — Progress Notes (Signed)
Patient with intermittent sinus tachycardia.  Went to bedside to evaluate patient noticed he was shaking.  Question patient as to whether he utilizes alcohol and he adamantly denies.  He reports in the past albuterol given frequently has caused similar symptoms.  Have discontinued albuterol nebulizer prn in favor of Xopenex nebulizer prn.  For now we will continue scheduled DuoNeb but if tachycardia and tremulousness persists may need to discontinue DuoNeb as well.  Erin Hearing, ANP

## 2017-05-14 DIAGNOSIS — F319 Bipolar disorder, unspecified: Secondary | ICD-10-CM

## 2017-05-14 DIAGNOSIS — E785 Hyperlipidemia, unspecified: Secondary | ICD-10-CM

## 2017-05-14 LAB — BASIC METABOLIC PANEL
Anion gap: 6 (ref 5–15)
BUN: 12 mg/dL (ref 6–20)
CALCIUM: 8.4 mg/dL — AB (ref 8.9–10.3)
CHLORIDE: 104 mmol/L (ref 101–111)
CO2: 40 mmol/L — ABNORMAL HIGH (ref 22–32)
CREATININE: 0.85 mg/dL (ref 0.61–1.24)
Glucose, Bld: 344 mg/dL — ABNORMAL HIGH (ref 65–99)
Potassium: 5 mmol/L (ref 3.5–5.1)
SODIUM: 150 mmol/L — AB (ref 135–145)

## 2017-05-14 LAB — CBC
HCT: 35.6 % — ABNORMAL LOW (ref 39.0–52.0)
HEMOGLOBIN: 11.3 g/dL — AB (ref 13.0–17.0)
MCH: 30.6 pg (ref 26.0–34.0)
MCHC: 31.7 g/dL (ref 30.0–36.0)
MCV: 96.5 fL (ref 78.0–100.0)
PLATELETS: 166 10*3/uL (ref 150–400)
RBC: 3.69 MIL/uL — ABNORMAL LOW (ref 4.22–5.81)
RDW: 13.1 % (ref 11.5–15.5)
WBC: 9.2 10*3/uL (ref 4.0–10.5)

## 2017-05-14 LAB — GLUCOSE, CAPILLARY: Glucose-Capillary: 262 mg/dL — ABNORMAL HIGH (ref 65–99)

## 2017-05-14 MED ORDER — METHYLPREDNISOLONE SODIUM SUCC 125 MG IJ SOLR
60.0000 mg | Freq: Two times a day (BID) | INTRAMUSCULAR | Status: DC
  Administered 2017-05-15: 60 mg via INTRAVENOUS
  Filled 2017-05-14: qty 2

## 2017-05-14 MED ORDER — IPRATROPIUM-ALBUTEROL 0.5-2.5 (3) MG/3ML IN SOLN
3.0000 mL | Freq: Three times a day (TID) | RESPIRATORY_TRACT | Status: DC
  Administered 2017-05-14 – 2017-05-16 (×6): 3 mL via RESPIRATORY_TRACT
  Filled 2017-05-14 (×6): qty 3

## 2017-05-14 NOTE — Progress Notes (Signed)
Inpatient Diabetes Program Recommendations  AACE/ADA: New Consensus Statement on Inpatient Glycemic Control (2015)  Target Ranges:  Prepandial:   less than 140 mg/dL      Peak postprandial:   less than 180 mg/dL (1-2 hours)      Critically ill patients:  140 - 180 mg/dL   Lab Results  Component Value Date   GLUCAP 109 (H) 05/01/2017   HGBA1C 6.5 (H) 04/29/2017   Results for Taylor [name], Taylor [name] (MRN [id number]) as of 05/14/2017 11:29  Ref. Range 05/14/2017 04:15  Glucose Latest Ref Range: 65 - 99 mg/dL 344 (H)   Current medications:  Solumedrol 60 mg IV q 6 hours Inpatient Diabetes Program Recommendations:   Note elevated glucose with steroids.  Please consider ordering Novolog correction sensitive tid with meals and HS.  Text page sent.   Thanks,  Adah Perl, RN, BC-ADM Inpatient Diabetes Coordinator Pager [phone] (8a-5p)

## 2017-05-14 NOTE — Care Management Note (Addendum)
Case Management Note  Patient Details  Name: Taylor [name] MRN: [id number] Date of Birth: 1955/06/29  Subjective/Objective:      Pt admitted with COPD exacerbation. He is from [place]. Pt has [facility] as his PCP and has Medicaid.               Action/Plan: CM consulted for medication coverage with his Medicaid. CM found that Ruthe Mannan is on the Medicaid preferred list but Trelegy Ellipta is not. MD updated. CM also found patient does have a nebulizer machine at home but needs Albuterol. CM will attempt to get him an appointment at [facility]. CM has called and left message for return call.   Addendum: CM called [facility] again at 4:00pm and again could not talk to someone to schedule an appointment. CM will attempt again in the am.   Expected Discharge Date:                  Expected Discharge Plan:  Home/Self Care  In-House Referral:     Discharge planning Services  CM Consult, Medication Assistance  Post Acute Care Choice:    Choice offered to:     DME Arranged:    DME Agency:     HH Arranged:    HH Agency:     Status of Service:  In process, will continue to follow  If discussed at Long Length of Stay Meetings, dates discussed:    Additional Comments:  Pollie Friar, RN 05/14/2017, 1:20 PM

## 2017-05-14 NOTE — Progress Notes (Signed)
PROGRESS NOTE  Taylor [name]  IRC:[id number] DOB: Jul 12, 1955 DOA: 05/13/2017 PCP: [facility] ([facility] [place] [facility] Brief Narrative: Taylor [name] is a 62 y.o. male with a history of COPD on 2L O2 for the past month since recent DC from [facility] for COPD exacerbation, tobacco use, and bipolar disorder who presented to the ED with worsening dyspnea. Initially required BiPAP but recovered quickly with steroids, breathing treatments, also given doxycycline. Respiratory status is improved from that time but not at baseline today.   Assessment & Plan: Principal Problem:   COPD with acute exacerbation ([place]) Active Problems:   Bipolar 1 disorder ([place])   Acute on chronic respiratory failure with hypoxemia ([facility])   Tobacco abuse   HLD (hyperlipidemia)  Acute on chronic hypoxic respiratory failure: Due to acute exacerbation of COPD. Precipitated by lack of medications at home, ongoing tobacco use.  - Discussed with care management. Will make PCP follow up as soon as possible following discharge, has pulmonology follow up already scheduled 5/7 at 2:15pm with Dr. Melvyn Novas. Plan to provide prescription for albuterol neb solution, dulera, spiriva. Trelegy ellipta not covered by medicaid though may be able to get samples from [facility] office.  - If not continuing to improve, may need pulmonology consultation.  - Continue doxycycline for COPD exacerbation - Continue scheduled duonebs and prn xopenex (has symptomatic sinus tachycardia w/albuterol on 4/23).  - Deescalate steroids with no wheezing on exam. Convert to po in next 2 days.   Tobacco use:  - Nicotine patch provided - Urged to quit  Hyperlipidemia:  - Continue statin  Bipolar disorder: Discussed danger of going on and off medications abruptly.  - Restarted home medications which he'd not been on. Will give short term prescription to get to follow up appointment at discharge.   DVT prophylaxis: Lovenox Code Status: Partial Family  Communication: None at bedside, declined offer to call.  Disposition Plan: Home when improved. Possibly 24-48 hrs.   Consultants:   None  Procedures:  BiPAP 4/23   Antimicrobials:  Doxycycline 4/23 >>    Subjective: Breathing better, no chest pain. Gets out of breath with even minor exertion (rising to stand) which is worse than baseline. No palpitations since yesterday afternoon.   Objective: Vitals:   05/14/17 0751 05/14/17 0753 05/14/17 1130 05/14/17 1334  BP:  129/70 123/73   Pulse: 82 82 89 90  Resp: 18 18 18 18   Temp:  97.9 F (36.6 C) 97.7 F (36.5 C)   TempSrc:  Oral Oral   SpO2: 95% 94% 98% 94%  Weight:      Height:        Intake/Output Summary (Last 24 hours) at 05/14/2017 1409 Last data filed at 05/14/2017 1300 Gross per 24 hour  Intake 1800 ml  Output 1300 ml  Net 500 ml   Filed Weights   05/13/17 0724 05/13/17 2226  Weight: 73.9 kg (163 lb) 73.9 kg (163 lb)   Gen: 62 y.o. male in no distress Pulm: Non-labored with supplemental oxygen. Extremely diminished throughout with mildly prolonged expiration. CV: Regular rate and rhythm. No murmur, rub, or gallop. No JVD, no pedal edema. GI: Abdomen soft, non-tender, non-distended, with normoactive bowel sounds. No organomegaly or masses felt. Ext: Warm, no deformities Skin: No rashes, lesions no ulcers Neuro: Alert and oriented. No focal neurological deficits. Psych: Judgement and insight appear normal. Mood & affect appropriate.   Data Reviewed: I have personally reviewed following labs and imaging studies  CBC: Recent Labs  Lab 05/13/17 0723 05/14/17 0415  WBC 9.6 9.2  NEUTROABS 6.4  --   HGB 13.0 11.3*  HCT 42.3 35.6*  MCV 100.5* 96.5  PLT 191 431   Basic Metabolic Panel: Recent Labs  Lab 05/13/17 0723 05/14/17 0415  NA 143 150*  K 4.3 5.0  CL 102 104  CO2 33* 40*  GLUCOSE 118* 344*  BUN 12 12  CREATININE 0.80 0.85  CALCIUM 8.4* 8.4*   GFR: Estimated Creatinine Clearance: 94.2  mL/min (by C-G formula based on SCr of 0.85 mg/dL). Liver Function Tests: Recent Labs  Lab 05/13/17 0723  AST 20  ALT 24  ALKPHOS 56  BILITOT 0.6  PROT 5.9*  ALBUMIN 3.6   No results for input(s): LIPASE, AMYLASE in the last 168 hours. No results for input(s): AMMONIA in the last 168 hours. Coagulation Profile: No results for input(s): INR, PROTIME in the last 168 hours. Cardiac Enzymes: No results for input(s): CKTOTAL, CKMB, CKMBINDEX, TROPONINI in the last 168 hours. BNP (last 3 results) No results for input(s): PROBNP in the last 8760 hours. HbA1C: No results for input(s): HGBA1C in the last 72 hours. CBG: Recent Labs  Lab 05/14/17 1159  GLUCAP 262*   Lipid Profile: No results for input(s): CHOL, HDL, LDLCALC, TRIG, CHOLHDL, LDLDIRECT in the last 72 hours. Thyroid Function Tests: No results for input(s): TSH, T4TOTAL, FREET4, T3FREE, THYROIDAB in the last 72 hours. Anemia Panel: No results for input(s): VITAMINB12, FOLATE, FERRITIN, TIBC, IRON, RETICCTPCT in the last 72 hours. Urine analysis:    Component Value Date/Time   COLORURINE YELLOW 04/18/2017 0809   APPEARANCEUR CLEAR 04/18/2017 0809   LABSPEC 1.023 04/18/2017 0809   PHURINE 7.0 04/18/2017 0809   GLUCOSEU >=500 (A) 04/18/2017 0809   HGBUR NEGATIVE 04/18/2017 0809   BILIRUBINUR NEGATIVE 04/18/2017 0809   KETONESUR 5 (A) 04/18/2017 0809   PROTEINUR NEGATIVE 04/18/2017 0809   UROBILINOGEN 1.0 01/09/2013 1843   NITRITE NEGATIVE 04/18/2017 0809   LEUKOCYTESUR NEGATIVE 04/18/2017 0809   Recent Results (from the past 240 hour(s))  Respiratory Panel by PCR     Status: None   Collection Time: 05/13/17  2:40 PM  Result Value Ref Range Status   Adenovirus NOT DETECTED NOT DETECTED Final   Coronavirus 229E NOT DETECTED NOT DETECTED Final   Coronavirus HKU1 NOT DETECTED NOT DETECTED Final   Coronavirus NL63 NOT DETECTED NOT DETECTED Final   Coronavirus OC43 NOT DETECTED NOT DETECTED Final   Metapneumovirus  NOT DETECTED NOT DETECTED Final   Rhinovirus / Enterovirus NOT DETECTED NOT DETECTED Final   Influenza A NOT DETECTED NOT DETECTED Final   Influenza B NOT DETECTED NOT DETECTED Final   Parainfluenza Virus 1 NOT DETECTED NOT DETECTED Final   Parainfluenza Virus 2 NOT DETECTED NOT DETECTED Final   Parainfluenza Virus 3 NOT DETECTED NOT DETECTED Final   Parainfluenza Virus 4 NOT DETECTED NOT DETECTED Final   Respiratory Syncytial Virus NOT DETECTED NOT DETECTED Final   Bordetella pertussis NOT DETECTED NOT DETECTED Final   Chlamydophila pneumoniae NOT DETECTED NOT DETECTED Final   Mycoplasma pneumoniae NOT DETECTED NOT DETECTED Final    Comment: Performed at [facility], [place] [address], [place], [place] [address]      Radiology Studies: Dg Chest Port 1 View  Result Date: 05/13/2017 CLINICAL DATA:  Shortness of breath EXAM: PORTABLE CHEST 1 VIEW COMPARISON:  April 29, 2017 FINDINGS: There is relative lucency in the upper lung zones, particular on the right, suggesting underlying bullous  disease. There is no edema or consolidation. Heart size and normal. Somewhat diminished vascularity in the upper lobes is suggestive of potential underlying bullous disease. No adenopathy. There old healed posterior rib fractures on the right superiorly. IMPRESSION: Suspect upper lobe bullous disease, particular on the right. No edema or consolidation. Electronically Signed   By: Lowella Grip III M.D.   On: 05/13/2017 07:51    Scheduled Meds: . ARIPiprazole  5 mg Oral BH-q7a  . doxycycline  100 mg Oral Q12H  . enoxaparin (LOVENOX) injection  40 mg Subcutaneous Q24H  . escitalopram  20 mg Oral BH-q7a  . ipratropium-albuterol  3 mL Nebulization TID  . methylPREDNISolone (SOLU-MEDROL) injection  60 mg Intravenous Q6H  . mometasone-formoterol  2 puff Inhalation BID  . nicotine  21 mg Transdermal Daily  . pravastatin  20 mg Oral q1800  . sodium chloride flush  3 mL Intravenous Q12H   Continuous  Infusions:   LOS: 1 day   Time spent: 25 minutes.  Vance Gather, MD [facility] Pager [phone]  If 7PM-7AM, please contact night-coverage [email or web address] Password [email or web address] 05/14/2017, 2:09 PM

## 2017-05-15 LAB — GLUCOSE, CAPILLARY
Glucose-Capillary: 231 mg/dL — ABNORMAL HIGH (ref 65–99)
Glucose-Capillary: 307 mg/dL — ABNORMAL HIGH (ref 65–99)

## 2017-05-15 MED ORDER — INSULIN ASPART 100 UNIT/ML ~~LOC~~ SOLN
0.0000 [IU] | Freq: Three times a day (TID) | SUBCUTANEOUS | Status: DC
  Administered 2017-05-15: 8 [IU] via SUBCUTANEOUS
  Administered 2017-05-15: 2 [IU] via SUBCUTANEOUS
  Administered 2017-05-16: 8 [IU] via SUBCUTANEOUS
  Administered 2017-05-16: 5 [IU] via SUBCUTANEOUS
  Administered 2017-05-16: 8 [IU] via SUBCUTANEOUS
  Administered 2017-05-17: 2 [IU] via SUBCUTANEOUS
  Administered 2017-05-17 (×2): 3 [IU] via SUBCUTANEOUS

## 2017-05-15 MED ORDER — INSULIN ASPART 100 UNIT/ML ~~LOC~~ SOLN
0.0000 [IU] | Freq: Every day | SUBCUTANEOUS | Status: DC
  Administered 2017-05-15: 4 [IU] via SUBCUTANEOUS

## 2017-05-15 MED ORDER — METHYLPREDNISOLONE SODIUM SUCC 125 MG IJ SOLR
60.0000 mg | Freq: Four times a day (QID) | INTRAMUSCULAR | Status: DC
  Administered 2017-05-15 – 2017-05-16 (×5): 60 mg via INTRAVENOUS
  Filled 2017-05-15 (×6): qty 2

## 2017-05-15 NOTE — Progress Notes (Addendum)
PROGRESS NOTE  Taylor [name]  ZJI:[id number] DOB: [birth year] DOA: 05/13/2017 PCP: [facility] (on [place] [facility]) Brief Narrative: Taylor [name] is a 62 y.o. male with a history of COPD on 2L O2 for the past month since recent DC from [facility] for COPD exacerbation, tobacco use, and bipolar disorder who presented to the ED with worsening dyspnea. Initially required BiPAP but recovered quickly with steroids, breathing treatments, also given doxycycline. He continues to have increased work of breathing with any exertion and abnormal exam.   Assessment & Plan: Principal Problem:   COPD with acute exacerbation ([place]) Active Problems:   Bipolar 1 disorder ([place])   Acute on chronic respiratory failure with hypoxemia (H[facility])   Tobacco abuse   HLD (hyperlipidemia)  Acute on chronic hypoxic respiratory failure: Due to acute exacerbation of COPD. Precipitated by lack of medications at home, ongoing tobacco use.  - Has not progressed, will return to more frequent steroid dosing given wheezing this morning.  - Continue doxycycline as well as scheduled duonebs and prn xopenex (has symptomatic sinus tachycardia w/albuterol on 4/23).  - Discussed with care management. Will make PCP follow up as soon as possible following discharge, has pulmonology follow up already scheduled 5/7 at 2:15pm with Dr. Melvyn Novas. Plan to provide prescription for albuterol neb solution, dulera, spiriva. Trelegy ellipta not covered by medicaid though may be able to get samples from [facility] office.  - If not continuing to improve, may need pulmonology consultation.   Tobacco use:  - Nicotine patch provided - Cessation counseling provided, he's down to a few per day.   Hyperlipidemia:  - Continue statin  Steroid-induced hyperglycemia: HbA1c 6.5% recently - CBGs and SSI AC/HS  Bipolar disorder: Discussed danger of going on and off medications abruptly.  - Restarted home medications which he'd not been on. May need  short term prescription to get to follow up appointment with new PCP at discharge.   Hypernatremia: Possibly iatrogenic w/IVF's.  - No further isotonic saline, recheck in AM.   DVT prophylaxis: Lovenox Code Status: Partial Family Communication: None at bedside, declined offer to call.  Disposition Plan: Home when improved.   Consultants:   None  Procedures:  BiPAP 4/23   Antimicrobials:  Doxycycline 4/23 >>    Subjective: No improvement in breathing from yesterday. Denies chest pain or palpitations. No leg swelling, orthopnea.   Objective: Vitals:   05/14/17 2311 05/15/17 0341 05/15/17 0823 05/15/17 0838  BP: 134/80 134/90 (!) 142/74   Pulse: 86 80 86   Resp: 18 16 16    Temp: 98.2 F (36.8 C) 98.2 F (36.8 C) 98.4 F (36.9 C)   TempSrc: Oral Oral Oral   SpO2: 96% 94% 97% 95%  Weight:      Height:        Intake/Output Summary (Last 24 hours) at 05/15/2017 1141 Last data filed at 05/15/2017 0823 Gross per 24 hour  Intake 960 ml  Output 1475 ml  Net -515 ml   Filed Weights   05/13/17 0724 05/13/17 2226  Weight: 73.9 kg (163 lb) 73.9 kg (163 lb)   Gen: 62 y.o. male breathing hard after getting back from the restroom (< 10 feet) Pulm: Tachypneic and labored with supplemental oxygen. Extremely diminished throughout with mildly prolonged expiration, end-expiratory wheezing bilaterally. CV: Regular rate and rhythm. No murmur, rub, or gallop. No JVD, no pedal edema. GI: Abdomen soft, non-tender, non-distended, with normoactive bowel sounds. No organomegaly or masses felt. Ext: Warm, no deformities Skin: No  rashes, lesions no ulcers Neuro: Alert and oriented. No focal neurological deficits. Psych: Judgement and insight appear normal. Mood & affect appropriate.   Data Reviewed: I have personally reviewed following labs and imaging studies  CBC: Recent Labs  Lab 05/13/17 0723 05/14/17 0415  WBC 9.6 9.2  NEUTROABS 6.4  --   HGB 13.0 11.3*  HCT 42.3 35.6*  MCV  100.5* 96.5  PLT 191 694   Basic Metabolic Panel: Recent Labs  Lab 05/13/17 0723 05/14/17 0415  NA 143 150*  K 4.3 5.0  CL 102 104  CO2 33* 40*  GLUCOSE 118* 344*  BUN 12 12  CREATININE 0.80 0.85  CALCIUM 8.4* 8.4*   GFR: Estimated Creatinine Clearance: 94.2 mL/min (by C-G formula based on SCr of 0.85 mg/dL). Liver Function Tests: Recent Labs  Lab 05/13/17 0723  AST 20  ALT 24  ALKPHOS 56  BILITOT 0.6  PROT 5.9*  ALBUMIN 3.6   No results for input(s): LIPASE, AMYLASE in the last 168 hours. No results for input(s): AMMONIA in the last 168 hours. Coagulation Profile: No results for input(s): INR, PROTIME in the last 168 hours. Cardiac Enzymes: No results for input(s): CKTOTAL, CKMB, CKMBINDEX, TROPONINI in the last 168 hours. BNP (last 3 results) No results for input(s): PROBNP in the last 8760 hours. HbA1C: No results for input(s): HGBA1C in the last 72 hours. CBG: Recent Labs  Lab 05/14/17 1159  GLUCAP 262*   Lipid Profile: No results for input(s): CHOL, HDL, LDLCALC, TRIG, CHOLHDL, LDLDIRECT in the last 72 hours. Thyroid Function Tests: No results for input(s): TSH, T4TOTAL, FREET4, T3FREE, THYROIDAB in the last 72 hours. Anemia Panel: No results for input(s): VITAMINB12, FOLATE, FERRITIN, TIBC, IRON, RETICCTPCT in the last 72 hours. Urine analysis:    Component Value Date/Time   COLORURINE YELLOW 04/18/2017 0809   APPEARANCEUR CLEAR 04/18/2017 0809   LABSPEC 1.023 04/18/2017 0809   PHURINE 7.0 04/18/2017 0809   GLUCOSEU >=500 (A) 04/18/2017 0809   HGBUR NEGATIVE 04/18/2017 0809   BILIRUBINUR NEGATIVE 04/18/2017 0809   KETONESUR 5 (A) 04/18/2017 0809   PROTEINUR NEGATIVE 04/18/2017 0809   UROBILINOGEN 1.0 01/09/2013 1843   NITRITE NEGATIVE 04/18/2017 0809   LEUKOCYTESUR NEGATIVE 04/18/2017 0809   Recent Results (from the past 240 hour(s))  Respiratory Panel by PCR     Status: None   Collection Time: 05/13/17  2:40 PM  Result Value Ref Range  Status   Adenovirus NOT DETECTED NOT DETECTED Final   Coronavirus 229E NOT DETECTED NOT DETECTED Final   Coronavirus HKU1 NOT DETECTED NOT DETECTED Final   Coronavirus NL63 NOT DETECTED NOT DETECTED Final   Coronavirus OC43 NOT DETECTED NOT DETECTED Final   Metapneumovirus NOT DETECTED NOT DETECTED Final   Rhinovirus / Enterovirus NOT DETECTED NOT DETECTED Final   Influenza A NOT DETECTED NOT DETECTED Final   Influenza B NOT DETECTED NOT DETECTED Final   Parainfluenza Virus 1 NOT DETECTED NOT DETECTED Final   Parainfluenza Virus 2 NOT DETECTED NOT DETECTED Final   Parainfluenza Virus 3 NOT DETECTED NOT DETECTED Final   Parainfluenza Virus 4 NOT DETECTED NOT DETECTED Final   Respiratory Syncytial Virus NOT DETECTED NOT DETECTED Final   Bordetella pertussis NOT DETECTED NOT DETECTED Final   Chlamydophila pneumoniae NOT DETECTED NOT DETECTED Final   Mycoplasma pneumoniae NOT DETECTED NOT DETECTED Final    Comment: Performed at [facility], [place] [address] [place], [place] [address]      Radiology Studies: No results  found.  Scheduled Meds: . ARIPiprazole  5 mg Oral BH-q7a  . doxycycline  100 mg Oral Q12H  . enoxaparin (LOVENOX) injection  40 mg Subcutaneous Q24H  . escitalopram  20 mg Oral BH-q7a  . insulin aspart  0-15 Units Subcutaneous TID WC  . insulin aspart  0-5 Units Subcutaneous QHS  . ipratropium-albuterol  3 mL Nebulization TID  . methylPREDNISolone (SOLU-MEDROL) injection  60 mg Intravenous Q12H  . mometasone-formoterol  2 puff Inhalation BID  . nicotine  21 mg Transdermal Daily  . pravastatin  20 mg Oral q1800  . sodium chloride flush  3 mL Intravenous Q12H   Continuous Infusions:   LOS: 2 days   Time spent: 25 minutes.  Vance Gather, MD [facility] Pager [phone]  If 7PM-7AM, please contact night-coverage [email or web address] Password [facility] 05/15/2017, 11:41 AM

## 2017-05-15 NOTE — Progress Notes (Signed)
Nurse notified Aide at 6 PM to take pts Blood sugar after nurse noticed CBG for 1700 was not recorded. Nurse tech reports she did not do CBG for patient all day and did not know pt was a cbg so she will not take the CBG at this time. Patients nurse was busy passing 6pm medication and doing d/c for RM 29 and transported pt down to [place]. Nurse came back to the unit at 1900 and and asked Nurse tech if CBG was done, tech responded CBG was not done and that she will pass it on to the next shift. RN reported incidence to charge nurse.

## 2017-05-16 LAB — GLUCOSE, CAPILLARY
GLUCOSE-CAPILLARY: 214 mg/dL — AB (ref 65–99)
GLUCOSE-CAPILLARY: 294 mg/dL — AB (ref 65–99)
Glucose-Capillary: 194 mg/dL — ABNORMAL HIGH (ref 65–99)

## 2017-05-16 LAB — BASIC METABOLIC PANEL
Anion gap: 13 (ref 5–15)
BUN: 15 mg/dL (ref 6–20)
CO2: 32 mmol/L (ref 22–32)
Calcium: 8.6 mg/dL — ABNORMAL LOW (ref 8.9–10.3)
Chloride: 93 mmol/L — ABNORMAL LOW (ref 101–111)
Creatinine, Ser: 0.81 mg/dL (ref 0.61–1.24)
GFR calc non Af Amer: >60 mL/min (ref >60)
Glucose, Bld: 226 mg/dL — ABNORMAL HIGH (ref 65–99)
POTASSIUM: 4.3 mmol/L (ref 3.5–5.1)
SODIUM: 138 mmol/L (ref 135–145)

## 2017-05-16 MED ORDER — METHYLPREDNISOLONE SODIUM SUCC 125 MG IJ SOLR
60.0000 mg | Freq: Two times a day (BID) | INTRAMUSCULAR | Status: DC
  Administered 2017-05-17: 60 mg via INTRAVENOUS
  Filled 2017-05-16: qty 2

## 2017-05-16 NOTE — Progress Notes (Signed)
PROGRESS NOTE  Taylor [name]  NWG:[id number] DOB: 10-23-1955 DOA: 05/13/2017 [facility]CP: [facility] ([facility] [place] [facility]) Brief Narrative:  62 y.o. male with a history of COPD on 2L O2 for the past month since recent DC from [facility] for COPD exacerbation, tobacco use, and bipolar disorder who presented to the ED with worsening dyspnea. Initially required BiPAP but recovered quickly with steroids, breathing treatments, also given doxycycline. He continues to have increased work of breathing with any exertion and abnormal exam.   Assessment & Plan: Principal Problem:   COPD with acute exacerbation ([place]) Active Problems:   Bipolar 1 disorder ([place])   Acute on chronic respiratory failure with hypoxemia (HCC)   Tobacco abuse   HLD (hyperlipidemia)  Acute on chronic hypoxic respiratory failure: Due to acute exacerbation of COPD. Precipitated by lack of medications at home, ongoing tobacco use.  - Has not progressed, will return to more frequent steroid dosing given wheezing this morning.  - Continue doxycycline as well as scheduled duonebs and prn xopenex (has symptomatic sinus tachycardia w/albuterol on 4/23).  - Discussed with care management. Will make PCP follow up as soon as possible following discharge, has pulmonology follow up already scheduled 5/7 at 2:15pm with Dr. Melvyn Novas. Plan to provide prescription for albuterol neb solution, dulera, spiriva. Trelegy ellipta not covered by medicaid though may be able to get samples from [facility] office.  -No wheezing on exam 4/26 therefore changing back steroids from Solu-Medrol 4 times daily to 60 every 12 4/26 with the aim to shift back to prednisone orally  Tobacco use:  - Nicotine patch provided - Cessation counseling provided, he's down to a few per day.   Hyperlipidemia:  - Continue statin  Steroid-induced hyperglycemia: HbA1c 6.5% recently - CBGs and SSI AC/HS--sugars are in the 200s but I expect with dropping the dose of Solu-Medrol this  will be better controlled  Bipolar disorder: Discussed danger of going on and off medications abruptly.  - Restarted home medications which he'd not been on. May need short term prescription to get to follow up appointment with new PCP at discharge.   Hypernatremia: Possibly iatrogenic w/IVF's.  - No further isotonic saline, recheck in AM.  Awake alert pleasant oriented in no distress  DVT prophylaxis: Lovenox Code Status: Partial Family Communication: None at bedside, declined offer to call.  Disposition Plan: Home when improved.   Consultants:   None  Procedures:  BiPAP 4/23   Antimicrobials:  Doxycycline 4/23 >>    Subjective:  Awake alert pleasant oriented in no distress Slightly anxious Tells me he wears oxygen 2 L at home No wheeze No fever No sputum, no chills, no rigor  Objective: Vitals:   05/16/17 0738 05/16/17 0845 05/16/17 1204 05/16/17 1611  BP:  133/81 (!) 166/82 139/87  Pulse:  88 85 75  Resp:  20 20 20   Temp:  98.1 F (36.7 C) 98.1 F (36.7 C) 98.3 F (36.8 C)  TempSrc:  Oral Oral Oral  SpO2: 96% 96% 95% 96%  Weight:      Height:        Intake/Output Summary (Last 24 hours) at 05/16/2017 1709 Last data filed at 05/16/2017 1612 Gross per 24 hour  Intake 1191 ml  Output 1630 ml  Net -439 ml   Filed Weights   05/13/17 0724 05/13/17 2226  Weight: 73.9 kg (163 lb) 73.9 kg (163 lb)   Gen: 62 y.o. male breathing hard after getting back from the restroom (< 10 feet) Pulm:  no wheeze CV: Regular rate and rhythm. No murmur, rub, or gallop. No JVD, no pedal edema. GI: Abdomen soft, non-tender, non-distended, with normoactive bowel sounds. No organomegaly or masses felt. Ext: Warm, no deformities Skin: No rashes, lesions no ulcers Neuro: Alert and oriented. No focal neurological deficits. Psych: Judgement and insight appear normal. Mood & affect appropriate.   Data Reviewed: I have personally reviewed following labs and imaging  studies  CBC: Recent Labs  Lab 05/13/17 0723 05/14/17 0415  WBC 9.6 9.2  NEUTROABS 6.4  --   HGB 13.0 11.3*  HCT 42.3 35.6*  MCV 100.5* 96.5  PLT 191 353   Basic Metabolic Panel: Recent Labs  Lab 05/13/17 0723 05/14/17 0415 05/16/17 0420  NA 143 150* 138  K 4.3 5.0 4.3  CL 102 104 93*  CO2 33* 40* 32  GLUCOSE 118* 344* 226*  BUN 12 12 15   CREATININE 0.80 0.85 0.81  CALCIUM 8.4* 8.4* 8.6*   GFR: Estimated Creatinine Clearance: 98.8 mL/min (by C-G formula based on SCr of 0.81 mg/dL). Liver Function Tests: Recent Labs  Lab 05/13/17 0723  AST 20  ALT 24  ALKPHOS 56  BILITOT 0.6  PROT 5.9*  ALBUMIN 3.6   No results for input(s): LIPASE, AMYLASE in the last 168 hours. No results for input(s): AMMONIA in the last 168 hours. Coagulation Profile: No results for input(s): INR, PROTIME in the last 168 hours. Cardiac Enzymes: No results for input(s): CKTOTAL, CKMB, CKMBINDEX, TROPONINI in the last 168 hours. BNP (last 3 results) No results for input(s): PROBNP in the last 8760 hours. HbA1C: No results for input(s): HGBA1C in the last 72 hours. CBG: Recent Labs  Lab 05/14/17 1159 05/15/17 1909 05/15/17 2153 05/16/17 0639 05/16/17 1143  GLUCAP 262* 231* 307* 214* 294*   Lipid Profile: No results for input(s): CHOL, HDL, LDLCALC, TRIG, CHOLHDL, LDLDIRECT in the last 72 hours. Thyroid Function Tests: No results for input(s): TSH, T4TOTAL, FREET4, T3FREE, THYROIDAB in the last 72 hours. Anemia Panel: No results for input(s): VITAMINB12, FOLATE, FERRITIN, TIBC, IRON, RETICCTPCT in the last 72 hours. Urine analysis:    Component Value Date/Time   COLORURINE YELLOW 04/18/2017 0809   APPEARANCEUR CLEAR 04/18/2017 0809   LABSPEC 1.023 04/18/2017 0809   PHURINE 7.0 04/18/2017 0809   GLUCOSEU >=500 (A) 04/18/2017 0809   HGBUR NEGATIVE 04/18/2017 0809   BILIRUBINUR NEGATIVE 04/18/2017 0809   KETONESUR 5 (A) 04/18/2017 0809   PROTEINUR NEGATIVE 04/18/2017 0809    UROBILINOGEN 1.0 01/09/2013 1843   NITRITE NEGATIVE 04/18/2017 0809   LEUKOCYTESUR NEGATIVE 04/18/2017 0809   Recent Results (from the past 240 hour(s))  Respiratory Panel by PCR     Status: None   Collection Time: 05/13/17  2:40 PM  Result Value Ref Range Status   Adenovirus NOT DETECTED NOT DETECTED Final   Coronavirus 229E NOT DETECTED NOT DETECTED Final   Coronavirus HKU1 NOT DETECTED NOT DETECTED Final   Coronavirus NL63 NOT DETECTED NOT DETECTED Final   Coronavirus OC43 NOT DETECTED NOT DETECTED Final   Metapneumovirus NOT DETECTED NOT DETECTED Final   Rhinovirus / Enterovirus NOT DETECTED NOT DETECTED Final   Influenza A NOT DETECTED NOT DETECTED Final   Influenza B NOT DETECTED NOT DETECTED Final   Parainfluenza Virus 1 NOT DETECTED NOT DETECTED Final   Parainfluenza Virus 2 NOT DETECTED NOT DETECTED Final   Parainfluenza Virus 3 NOT DETECTED NOT DETECTED Final   Parainfluenza Virus 4 NOT DETECTED NOT DETECTED Final   Respiratory Syncytial  Virus NOT DETECTED NOT DETECTED Final   Bordetella pertussis NOT DETECTED NOT DETECTED Final   Chlamydophila pneumoniae NOT DETECTED NOT DETECTED Final   Mycoplasma pneumoniae NOT DETECTED NOT DETECTED Final    Comment: Performed at [facility], [place] [address] [place], [place] [address]      Radiology Studies: No results found.  Scheduled Meds: . ARIPiprazole  5 mg Oral BH-q7a  . doxycycline  100 mg Oral Q12H  . enoxaparin (LOVENOX) injection  40 mg Subcutaneous Q24H  . escitalopram  20 mg Oral BH-q7a  . insulin aspart  0-15 Units Subcutaneous TID WC  . insulin aspart  0-5 Units Subcutaneous QHS  . [START ON 05/17/2017] methylPREDNISolone (SOLU-MEDROL) injection  60 mg Intravenous Q12H  . mometasone-formoterol  2 puff Inhalation BID  . nicotine  21 mg Transdermal Daily  . pravastatin  20 mg Oral q1800  . sodium chloride flush  3 mL Intravenous Q12H   Continuous Infusions:   LOS: 3 days   Time spent: 15 minutes.  Verneita Griffes, MD [phone]   If 7PM-7AM, please contact night-coverage [email or web address] Password [facility] 05/16/2017, 5:09 PM

## 2017-05-16 NOTE — Progress Notes (Signed)
Inpatient Diabetes Program Recommendations  AACE/ADA: New Consensus Statement on Inpatient Glycemic Control (2015)  Target Ranges:  Prepandial:   less than 140 mg/dL      Peak postprandial:   less than 180 mg/dL (1-2 hours)      Critically ill patients:  140 - 180 mg/dL   Results for JAKAI, RISSE (MRN [id number]) as of 05/16/2017 11:52  Ref. Range 05/14/2017 11:59 05/15/2017 19:09 05/15/2017 21:53  Glucose-Capillary Latest Ref Range: 65 - 99 mg/dL 262 (H) 231 (H) 307 (H)   Results for EMIR, NACK (MRN [id number]) as of 05/16/2017 11:52  Ref. Range 05/16/2017 06:39 05/16/2017 11:43  Glucose-Capillary Latest Ref Range: 65 - 99 mg/dL 214 (H) 294 (H)    Admit with: COPD  No History of DM  Current Insulin Orders: Novolog Moderate Correction Scale/ SSI (0-15 units) TID AC + HS       MD- Note patient currently receiving Solumedrol 60 mg Q6 hours.  Steroids likely worsening glucose control.  Please consider the following in-hospital insulin regimen while patient getting steroids:  1. Start Lantus 10 units daily (0.15 units/kg dosing based on weight of 73 kg)  2. Start Novolog Meal Coverage: Novolog 4 units TID with meals (hold if pt eats <50% of meal)  Will likely need to taper insulin quickly as steroids are reduced  3. Patient's A1c back on 04/29/17 was 6.5%- Needs follow up with PCP to determine if patient will need medication in the future for blood glucose levels.  Currently does not have diagnosis of DM.  Likely needs follow-up A1c test to be completed in 3 months.      --Will follow patient during hospitalization--  Wyn Quaker RN, MSN, CDE Diabetes Coordinator Inpatient Glycemic Control Team Team Pager: [phone] (8a-5p)

## 2017-05-16 NOTE — Plan of Care (Signed)
  Problem: Coping: Goal: Level of anxiety will decrease Outcome: Progressing   Problem: Elimination: Goal: Will not experience complications related to bowel motility Outcome: Progressing   Problem: Pain Managment: Goal: General experience of comfort will improve Outcome: Progressing   Problem: Safety: Goal: Ability to remain free from injury will improve Outcome: Progressing   

## 2017-05-17 LAB — GLUCOSE, CAPILLARY
GLUCOSE-CAPILLARY: 198 mg/dL — AB (ref 65–99)
GLUCOSE-CAPILLARY: 121 mg/dL — AB (ref 65–99)
Glucose-Capillary: 192 mg/dL — ABNORMAL HIGH (ref 65–99)
Glucose-Capillary: 131 mg/dL — ABNORMAL HIGH (ref 65–99)

## 2017-05-17 MED ORDER — PREDNISONE 20 MG PO TABS
40.0000 mg | ORAL_TABLET | Freq: Every day | ORAL | Status: DC
  Administered 2017-05-18: 40 mg via ORAL
  Filled 2017-05-17: qty 2

## 2017-05-17 NOTE — Plan of Care (Signed)
Pt able to verbalize plan of care for Dm. Pt able to verbalize proper meal choices. Pt states he is ready to go home.

## 2017-05-17 NOTE — Progress Notes (Signed)
SATURATION QUALIFICATIONS: (This note is used to comply with regulatory documentation for home oxygen)  Patient Saturations on Room Air at Rest = 88%  Patient Saturations on Room Air while Ambulating = 84%  Patient Saturations on 2 Liters of oxygen while Ambulating =92%  Please briefly explain why patient needs home oxygen: From home with home O2 for COPD exacerbation.

## 2017-05-17 NOTE — Progress Notes (Signed)
PROGRESS NOTE  Taylor [name]  ZOX:[id number] DOB: Sep 08, 1955 DOA: 05/13/2017 PCP: [facility] (on [place] [facility]) Brief Narrative:  62 y.o. male with a history of COPD on 2L O2 for the past month since recent DC from [facility] for COPD exacerbation, tobacco use, and bipolar disorder who presented to the ED with worsening dyspnea. Initially required BiPAP but recovered quickly with steroids, breathing treatments, also given doxycycline. He continues to have increased work of breathing with any exertion and abnormal exam.   Assessment & Plan: Principal Problem:   COPD with acute exacerbation ([place]) Active Problems:   Bipolar 1 disorder ([place])   Acute on chronic respiratory failure with hypoxemia (HCC)   Tobacco abuse   HLD (hyperlipidemia)  Acute on chronic hypoxic respiratory failure: Due to acute exacerbation of COPD. Precipitated by lack of medications at home, ongoing tobacco use.  - Continue doxycycline as well as scheduled duonebs and prn xopenex (has symptomatic sinus tachycardia w/albuterol on 4/23).  - Discussed with care management. Will make PCP follow up as soon as possible following discharge, has pulmonology follow up already scheduled 5/7 at 2:15pm with Dr. Melvyn Novas. Plan to provide prescription for albuterol neb solution, dulera, spiriva. Trelegy ellipta not covered by medicaid though may be able to get samples from [facility] office.  -No wheezing on exam 4/26 therefore changing back steroids from Solu-Medrol 4 times daily to 60 every 12 4/26 --->PO prednisone 40 daily form this pm--has improved to some degree -desat screen pending, nursing aware  Tobacco use:  - Nicotine patch provided - Cessation counseling provided, he's down to a few per day.   Hyperlipidemia:  - Continue statin  Steroid-induced hyperglycemia: HbA1c 6.5% recently - CBGs and SSI AC/HS--sugars are in the 200s but I expect with dropping steorids will help -no more coke and puddings  Bipolar disorder:  Discussed danger of going on and off medications abruptly.  - Restarted home medications which he'd not been on. May need short term prescription to get to follow up appointment with new PCP at discharge.  -will be going to "half-way house" on d/c  Hypernatremia: Possibly iatrogenic w/IVF's.  - No further isotonic saline, recheck in AM.  Awake alert pleasant oriented in no distress  DVT prophylaxis: Lovenox Code Status: Partial Family Communication: None at bedside, declined offer to call.  Disposition Plan: Home when improved.   Consultants:   None  Procedures:  BiPAP 4/23   Antimicrobials:  Doxycycline 4/23 >>    Subjective:  Anxious Ambulating in room to the RR without issue-doesn't objectively appear winded   Objective: Vitals:   05/17/17 0335 05/17/17 0500 05/17/17 0812 05/17/17 0914  BP: 137/82  (!) 154/91   Pulse: 76  79   Resp: 18  18   Temp: 98.3 F (36.8 C)  97.6 F (36.4 C)   TempSrc: Oral  Axillary   SpO2: 97%  97% 98%  Weight:  75.1 kg (165 lb 9.1 oz)    Height:        Intake/Output Summary (Last 24 hours) at 05/17/2017 1110 Last data filed at 05/17/2017 0300 Gross per 24 hour  Intake 240 ml  Output 1000 ml  Net -760 ml   Filed Weights   05/13/17 0724 05/13/17 2226 05/17/17 0500  Weight: 73.9 kg (163 lb) 73.9 kg (163 lb) 75.1 kg (165 lb 9.1 oz)   Gen: awake alert anxious Chest is clear-no rales no wheeze CV: Regular rate and rhythm. No murmur, rub, or gallop. No JVD, no  pedal edema. GI: Abdomen soft, non-tender, non-distended, with normoactive bowel sounds. No organomegaly or masses felt. Ext: Warm, no deformities Skin: No rashes, lesions no ulcers Neuro: Alert and oriented. No focal neurological deficits. Psych: Judgement and insight appear normal. Mood & affect appropriate.   Data Reviewed: I have personally reviewed following labs and imaging studies  CBC: Recent Labs  Lab 05/13/17 0723 05/14/17 0415  WBC 9.6 9.2  NEUTROABS 6.4  --    HGB 13.0 11.3*  HCT 42.3 35.6*  MCV 100.5* 96.5  PLT 191 809   Basic Metabolic Panel: Recent Labs  Lab 05/13/17 0723 05/14/17 0415 05/16/17 0420  NA 143 150* 138  K 4.3 5.0 4.3  CL 102 104 93*  CO2 33* 40* 32  GLUCOSE 118* 344* 226*  BUN 12 12 15   CREATININE 0.80 0.85 0.81  CALCIUM 8.4* 8.4* 8.6*   GFR: Estimated Creatinine Clearance: 100.4 mL/min (by C-G formula based on SCr of 0.81 mg/dL). Liver Function Tests: Recent Labs  Lab 05/13/17 0723  AST 20  ALT 24  ALKPHOS 56  BILITOT 0.6  PROT 5.9*  ALBUMIN 3.6   No results for input(s): LIPASE, AMYLASE in the last 168 hours. No results for input(s): AMMONIA in the last 168 hours. Coagulation Profile: No results for input(s): INR, PROTIME in the last 168 hours. Cardiac Enzymes: No results for input(s): CKTOTAL, CKMB, CKMBINDEX, TROPONINI in the last 168 hours. BNP (last 3 results) No results for input(s): PROBNP in the last 8760 hours. HbA1C: No results for input(s): HGBA1C in the last 72 hours. CBG: Recent Labs  Lab 05/15/17 2153 05/16/17 0639 05/16/17 1143 05/16/17 2143 05/17/17 0606  GLUCAP 307* 214* 294* 194* 192*   Lipid Profile: No results for input(s): CHOL, HDL, LDLCALC, TRIG, CHOLHDL, LDLDIRECT in the last 72 hours. Thyroid Function Tests: No results for input(s): TSH, T4TOTAL, FREET4, T3FREE, THYROIDAB in the last 72 hours. Anemia Panel: No results for input(s): VITAMINB12, FOLATE, FERRITIN, TIBC, IRON, RETICCTPCT in the last 72 hours. Urine analysis:    Component Value Date/Time   COLORURINE YELLOW 04/18/2017 0809   APPEARANCEUR CLEAR 04/18/2017 0809   LABSPEC 1.023 04/18/2017 0809   PHURINE 7.0 04/18/2017 0809   GLUCOSEU >=500 (A) 04/18/2017 0809   HGBUR NEGATIVE 04/18/2017 0809   BILIRUBINUR NEGATIVE 04/18/2017 0809   KETONESUR 5 (A) 04/18/2017 0809   PROTEINUR NEGATIVE 04/18/2017 0809   UROBILINOGEN 1.0 01/09/2013 1843   NITRITE NEGATIVE 04/18/2017 0809   LEUKOCYTESUR NEGATIVE  04/18/2017 0809   Recent Results (from the past 240 hour(s))  Respiratory Panel by PCR     Status: None   Collection Time: 05/13/17  2:40 PM  Result Value Ref Range Status   Adenovirus NOT DETECTED NOT DETECTED Final   Coronavirus 229E NOT DETECTED NOT DETECTED Final   Coronavirus HKU1 NOT DETECTED NOT DETECTED Final   Coronavirus NL63 NOT DETECTED NOT DETECTED Final   Coronavirus OC43 NOT DETECTED NOT DETECTED Final   Metapneumovirus NOT DETECTED NOT DETECTED Final   Rhinovirus / Enterovirus NOT DETECTED NOT DETECTED Final   Influenza A NOT DETECTED NOT DETECTED Final   Influenza B NOT DETECTED NOT DETECTED Final   Parainfluenza Virus 1 NOT DETECTED NOT DETECTED Final   Parainfluenza Virus 2 NOT DETECTED NOT DETECTED Final   Parainfluenza Virus 3 NOT DETECTED NOT DETECTED Final   Parainfluenza Virus 4 NOT DETECTED NOT DETECTED Final   Respiratory Syncytial Virus NOT DETECTED NOT DETECTED Final   Bordetella pertussis NOT DETECTED NOT DETECTED Final  Chlamydophila pneumoniae NOT DETECTED NOT DETECTED Final   Mycoplasma pneumoniae NOT DETECTED NOT DETECTED Final    Comment: Performed at [facility], [place] [address] [place], [place] [address]      Radiology Studies: No results found.  Scheduled Meds: . ARIPiprazole  5 mg Oral BH-q7a  . doxycycline  100 mg Oral Q12H  . enoxaparin (LOVENOX) injection  40 mg Subcutaneous Q24H  . escitalopram  20 mg Oral BH-q7a  . insulin aspart  0-15 Units Subcutaneous TID WC  . insulin aspart  0-5 Units Subcutaneous QHS  . mometasone-formoterol  2 puff Inhalation BID  . nicotine  21 mg Transdermal Daily  . pravastatin  20 mg Oral q1800  . [START ON 05/18/2017] predniSONE  40 mg Oral QAC breakfast  . sodium chloride flush  3 mL Intravenous Q12H   Continuous Infusions:   LOS: 4 days   Time spent: 15 minutes.  Verneita Griffes, MD [phone]   If 7PM-7AM, please contact night-coverage [email or web address] Password  [email or web address] 05/17/2017, 11:10 AM

## 2017-05-18 LAB — GLUCOSE, CAPILLARY: Glucose-Capillary: 103 mg/dL — ABNORMAL HIGH (ref 65–99)

## 2017-05-18 MED ORDER — ESCITALOPRAM OXALATE 20 MG PO TABS: 20.0000 mg | ORAL_TABLET | ORAL | 12 refills | Status: DC

## 2017-05-18 MED ORDER — LOVASTATIN 20 MG PO TABS: 20.0000 mg | ORAL_TABLET | Freq: Every day | ORAL | 12 refills | Status: DC

## 2017-05-18 MED ORDER — FLUTICASONE PROPIONATE HFA 110 MCG/ACT IN AERO: 2.0000 | INHALATION_SPRAY | Freq: Two times a day (BID) | RESPIRATORY_TRACT | 12 refills | Status: DC

## 2017-05-18 MED ORDER — ARIPIPRAZOLE 5 MG PO TABS: 5.0000 mg | ORAL_TABLET | ORAL | 12 refills | Status: DC

## 2017-05-18 MED ORDER — TIOTROPIUM BROMIDE MONOHYDRATE 18 MCG IN CAPS: 18.0000 ug | ORAL_CAPSULE | Freq: Every day | RESPIRATORY_TRACT | 12 refills | Status: DC

## 2017-05-18 MED ORDER — ALBUTEROL SULFATE (2.5 MG/3ML) 0.083% IN NEBU: 2.5000 mg | INHALATION_SOLUTION | Freq: Four times a day (QID) | RESPIRATORY_TRACT | 0 refills | Status: DC | PRN

## 2017-05-18 MED ORDER — PREDNISONE 20 MG PO TABS: 40.0000 mg | ORAL_TABLET | Freq: Every day | ORAL | 0 refills | Status: DC

## 2017-05-18 MED ORDER — MOMETASONE FURO-FORMOTEROL FUM 100-5 MCG/ACT IN AERO: 2.0000 | INHALATION_SPRAY | Freq: Two times a day (BID) | RESPIRATORY_TRACT | 12 refills | Status: DC

## 2017-05-18 NOTE — Care Management Note (Signed)
Case Management Note  Patient Details  Name: Taylor [name] MRN: [id number] Date of Birth: 16-Jul-1955  Subjective/Objective:                    Action/Plan: Pt discharging home with self care. Pt doesn't have transportation home or oxygen for the transport. Pt requesting PTAR. CM called and arranged transport. Form on the front of the chart and bedside RN updated.  Pt has home oxygen through the [place]. Pt states he will have some one to take him to get his medications from [employer] home.   Expected Discharge Date:  05/18/17               Expected Discharge Plan:  Home/Self Care  In-House Referral:     Discharge planning Services  CM Consult, Medication Assistance  Post Acute Care Choice:    Choice offered to:     DME Arranged:    DME Agency:     HH Arranged:    HH Agency:     Status of Service:  Completed, signed off  If discussed at [employer] of Stay Meetings, dates discussed:    Additional Comments:  Pollie Friar, RN 05/18/2017, 10:12 AM

## 2017-05-19 LAB — GLUCOSE, CAPILLARY: Glucose-Capillary: 290 mg/dL — ABNORMAL HIGH (ref 65–99)

## 2017-05-20 ENCOUNTER — Encounter (HOSPITAL_COMMUNITY): Payer: Self-pay | Admitting: Emergency Medicine

## 2017-05-20 ENCOUNTER — Emergency Department (HOSPITAL_COMMUNITY): Payer: Medicaid Other

## 2017-05-20 ENCOUNTER — Inpatient Hospital Stay (HOSPITAL_COMMUNITY)
Admission: EM | Admit: 2017-05-20 | Discharge: 2017-05-26 | DRG: 190 | Disposition: A | Discharge status: Home / Self Care | Payer: Medicaid Other | Attending: Internal Medicine | Admitting: Internal Medicine

## 2017-05-20 DIAGNOSIS — J441 Chronic obstructive pulmonary disease with (acute) exacerbation: Principal | ICD-10-CM | POA: Diagnosis present

## 2017-05-20 DIAGNOSIS — Z9114 Patient's other noncompliance with medication regimen: Secondary | ICD-10-CM

## 2017-05-20 DIAGNOSIS — I10 Essential (primary) hypertension: Secondary | ICD-10-CM | POA: Diagnosis present

## 2017-05-20 DIAGNOSIS — F17210 Nicotine dependence, cigarettes, uncomplicated: Secondary | ICD-10-CM | POA: Diagnosis present

## 2017-05-20 DIAGNOSIS — R0789 Other chest pain: Secondary | ICD-10-CM | POA: Diagnosis not present

## 2017-05-20 DIAGNOSIS — C61 Malignant neoplasm of prostate: Secondary | ICD-10-CM | POA: Diagnosis present

## 2017-05-20 DIAGNOSIS — Z7952 Long term (current) use of systemic steroids: Secondary | ICD-10-CM

## 2017-05-20 DIAGNOSIS — Z801 Family history of malignant neoplasm of trachea, bronchus and lung: Secondary | ICD-10-CM

## 2017-05-20 DIAGNOSIS — F319 Bipolar disorder, unspecified: Secondary | ICD-10-CM | POA: Diagnosis present

## 2017-05-20 DIAGNOSIS — E785 Hyperlipidemia, unspecified: Secondary | ICD-10-CM | POA: Diagnosis present

## 2017-05-20 DIAGNOSIS — Z7951 Long term (current) use of inhaled steroids: Secondary | ICD-10-CM

## 2017-05-20 DIAGNOSIS — J9621 Acute and chronic respiratory failure with hypoxia: Secondary | ICD-10-CM | POA: Diagnosis present

## 2017-05-20 DIAGNOSIS — Z9981 Dependence on supplemental oxygen: Secondary | ICD-10-CM

## 2017-05-20 DIAGNOSIS — T380X5A Adverse effect of glucocorticoids and synthetic analogues, initial encounter: Secondary | ICD-10-CM | POA: Diagnosis present

## 2017-05-20 DIAGNOSIS — Z6823 Body mass index (BMI) 23.0-23.9, adult: Secondary | ICD-10-CM

## 2017-05-20 DIAGNOSIS — Z8249 Family history of ischemic heart disease and other diseases of the circulatory system: Secondary | ICD-10-CM

## 2017-05-20 DIAGNOSIS — Z79899 Other long term (current) drug therapy: Secondary | ICD-10-CM

## 2017-05-20 DIAGNOSIS — R6510 Systemic inflammatory response syndrome (SIRS) of non-infectious origin without acute organ dysfunction: Secondary | ICD-10-CM | POA: Diagnosis present

## 2017-05-20 DIAGNOSIS — Z9221 Personal history of antineoplastic chemotherapy: Secondary | ICD-10-CM

## 2017-05-20 DIAGNOSIS — Z85038 Personal history of other malignant neoplasm of large intestine: Secondary | ICD-10-CM

## 2017-05-20 DIAGNOSIS — E1165 Type 2 diabetes mellitus with hyperglycemia: Secondary | ICD-10-CM | POA: Diagnosis present

## 2017-05-20 DIAGNOSIS — R739 Hyperglycemia, unspecified: Secondary | ICD-10-CM

## 2017-05-20 DIAGNOSIS — E669 Obesity, unspecified: Secondary | ICD-10-CM | POA: Diagnosis present

## 2017-05-20 LAB — CBC
HCT: 42.3 % (ref 39.0–52.0)
HEMOGLOBIN: 14 g/dL (ref 13.0–17.0)
MCH: 31.2 pg (ref 26.0–34.0)
MCHC: 33.1 g/dL (ref 30.0–36.0)
MCV: 94.2 fL (ref 78.0–100.0)
Platelets: 203 10*3/uL (ref 150–400)
RBC: 4.49 MIL/uL (ref 4.22–5.81)
RDW: 13.7 % (ref 11.5–15.5)
WBC: 10.2 10*3/uL (ref 4.0–10.5)

## 2017-05-20 LAB — I-STAT TROPONIN, ED: Troponin i, poc: 0 ng/mL (ref 0.00–0.08)

## 2017-05-20 MED ORDER — METHYLPREDNISOLONE SODIUM SUCC 125 MG IJ SOLR
125.0000 mg | Freq: Once | INTRAMUSCULAR | Status: DC
  Filled 2017-05-20: qty 2

## 2017-05-20 MED ORDER — MAGNESIUM SULFATE 2 GM/50ML IV SOLN
2.0000 g | Freq: Once | INTRAVENOUS | Status: AC
  Administered 2017-05-20: 2 g via INTRAVENOUS
  Filled 2017-05-20: qty 50

## 2017-05-20 MED ORDER — LACTATED RINGERS IV BOLUS
1000.0000 mL | Freq: Once | INTRAVENOUS | Status: AC
  Administered 2017-05-20: 1000 mL via INTRAVENOUS

## 2017-05-20 MED ORDER — ALBUTEROL (5 MG/ML) CONTINUOUS INHALATION SOLN
15.0000 mg/h | INHALATION_SOLUTION | RESPIRATORY_TRACT | Status: DC
  Administered 2017-05-20: 15 mg/h via RESPIRATORY_TRACT
  Filled 2017-05-20: qty 20

## 2017-05-20 NOTE — ED Triage Notes (Signed)
Per EMS SOB from home, was on 3L today, 97% oxygen, wheezy, tripoding, accessory muscle use, pt was here for a week, 10 mg albuterol, 0.5 mg atrovent, 125 mg solumedrol, 20G left hand, 168/87, HR 100

## 2017-05-20 NOTE — ED Provider Notes (Signed)
Emergency Department Provider Note   I have reviewed the triage vital signs and the nursing notes.   HISTORY  Chief Complaint No chief complaint on file.   HPI Taylor [name] is a 62 y.o. male history of COPD who continues to smoke no returns to the emergency room after being discharged from the hospital about 4 days ago for COPD exacerbation.  States that he was doing okay to his medications and he smoked a cigarette today and had acute onset of worsening dyspnea similar to previous COPD exacerbations.  States tried some medications at home but did not seem to help so he called EMS and EMS gave a dose of DuoNeb, steroids and brought him here for further evaluation.  Patient also has a little bit of chest pain similar to previous episodes of COPD exacerbations.  No recent fevers or leg swelling.  No history of blood clots. No other associated or modifying symptoms.    Past Medical History:  Diagnosis Date  . Bipolar 1 disorder ([place]) 02/05/2012  . 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 SHERRILL  . COPD (chronic obstructive pulmonary disease) ([place])    SMOKER  . Full dentures   . Hemorrhoids   . Inguinal hernia    RIGHT- PAINFUL  . Prostate cancer ([place])    Gleason score = 7, supposed to have radiation therapy but he has not followed up  . Rib fractures    hx of  . Shortness of breath     Patient Active Problem List   Diagnosis Date Noted  . Acute on chronic respiratory failure with hypoxemia ([place]) 05/13/2017  . Tobacco abuse 05/13/2017  . HLD (hyperlipidemia) 05/13/2017  . Acute on chronic respiratory failure with hypoxia and hypercapnia ([place]) 04/06/2017  . Leukocytosis 04/06/2017  . Adjustment disorder with depressed mood 03/28/2017  . Adjustment disorder with mixed disturbance of emotions and conduct   . Chronic respiratory failure with hypoxia ([place]) 03/24/2017  . Normocytic normochromic anemia 03/24/2017  . COPD with  acute exacerbation ([place]) 10/22/2016  . Acute bronchitis due to human metapneumovirus 01/16/2013  . Alcohol abuse 01/09/2013  . COPD exacerbation ([place]) 01/09/2013  . Chest pain 01/09/2013  . Acute respiratory failure with hypoxia ([place]) 01/09/2013  . Smoker 12/16/2012  . Inguinal hernia unilateral, non-recurrent, right 05/01/2012  . Dyspepsia 02/05/2012  . Bipolar 1 disorder ([place]) 02/05/2012  . Hyperglycemia 02/04/2012  . COPD  GOLD III 02/03/2012  . Thrombocytopenia ([place]) 02/03/2012  . Depression   . 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 COLON SURGERY  04/11/11   Sigmoid colectomy  . COLOSTOMY REVISION  04/11/2011   Procedure: COLON RESECTION SIGMOID;  Surgeon: [name], MD;  Location: [facility];  Service: General;  Laterality: N/A;  low anterior colon resection   . INGUINAL HERNIA REPAIR Right 05/01/2012   Procedure: HERNIA REPAIR INGUINAL ADULT;  Surgeon: [name], MD;  Location: [facility];  Service: General;  Laterality: Right;  . INSERTION OF MESH Right 05/01/2012   Procedure: INSERTION OF MESH;  Surgeon: [name], MD;  Location: [facility];  Service: General;  Laterality: Right;  . PORT-A-CATH REMOVAL Left 05/01/2012   Procedure: REMOVAL Infusion Port;  Surgeon: [name], MD;  Location: [facility];  Service: General;  Laterality: Left;  . PORTACATH PLACEMENT    . PORTACATH PLACEMENT  05/02/2011   Procedure: INSERTION PORT-A-CATH;  Surgeon: Merlinda Frederick  Gerkin, MD;  Location: [facility];  Service: General;  Laterality: N/A;    Current Outpatient Rx  . Order #: [id number] Class: Normal  . Order #: [id number] Class: Normal  . Order #: [id number] Class: Print  . Order #: [id number] Class: Normal  . Order #: [id number] Class: Normal  . Order #: [id number] Class: Normal  . Order #: [id number] Class: Historical Med  . Order #: [id number] Class: Normal  . Order #: [id number] Class: Normal    Allergies Codeine  Family History  Problem Relation Age  of Onset  . Heart disease Father   . Lung cancer Maternal Uncle        smoked    Social History Social History   Tobacco Use  . Smoking status: Former Smoker    Packs/day: 1.00    Years: 45.00    Pack years: 45.00    Types: Cigarettes    Last attempt to quit: 01/22/2016    Years since quitting: 1.3  . Smokeless tobacco: Former Network engineer Use Topics  . Alcohol use: No    Comment: h/o use in the past, no h/o heavy use  . Drug use: No    Review of Systems  All other systems negative except as documented in the HPI. All pertinent positives and negatives as reviewed in the HPI. ____________________________________________   PHYSICAL EXAM:  VITAL SIGNS: ED Triage Vitals  Enc Vitals Group     BP 05/20/17 2230 125/74     Pulse Rate 05/20/17 2155 (!) 126     Resp 05/20/17 2155 (!) 25     Temp 05/20/17 2157 98.4 F (36.9 C)     Temp Source 05/20/17 2157 Axillary     SpO2 05/20/17 2155 98 %     Weight 05/20/17 2152 165 lb (74.8 kg)     Height 05/20/17 2152 5\' 10"  (1.778 m)    Constitutional: Alert and oriented. Well appearing and in mod respiratory distress. Eyes: Conjunctivae are normal. PERRL. EOMI. Head: Atraumatic. Nose: No congestion/rhinnorhea. Mouth/Throat: Mucous membranes are moist.  Oropharynx non-erythematous. Neck: No stridor.  No meningeal signs.   Cardiovascular: tachycardic rate, regular rhythm. Good peripheral circulation. Grossly normal heart sounds.   Respiratory: tachypneic respiratory effort.  No retractions. Lungs diminished with wheezing. Gastrointestinal: Soft and nontender. No distention.  Musculoskeletal: No lower extremity tenderness nor edema. No gross deformities of extremities. Neurologic:  Normal speech and language. No gross focal neurologic deficits are appreciated.  Skin:  Skin is warm, dry and intact. No rash noted.  ____________________________________________   LABS (all labs ordered are listed, but only abnormal results are  displayed)  Labs Reviewed  CBC  BASIC METABOLIC PANEL  I-STAT TROPONIN, ED   ____________________________________________  EKG   EKG Interpretation  Date/Time:  Tuesday May 20 2017 22:01:13 EDT Ventricular Rate:  147 PR Interval:    QRS Duration: 72 QT Interval:  278 QTC Calculation: 435 R Axis:   -77 Text Interpretation:  Sinus tachycardia Consider right atrial enlargement Left anterior fascicular block Anterior infarct, old tachycardia new since a week ago Confirmed by Merrily Pew [phone]) on 05/20/2017 10:59:36 PM       ____________________________________________  RADIOLOGY  Dg Chest 2 View  Result Date: 05/20/2017 CLINICAL DATA:  Shortness of breath EXAM: CHEST - 2 VIEW COMPARISON:  05/13/2017 FINDINGS: Lungs are clear.  No pleural effusion or pneumothorax. The heart is normal in size. Mild degenerative changes of the visualized thoracolumbar spine. IMPRESSION: Normal chest radiographs. Electronically Signed   By: Julian Hy M.D.   On:  05/20/2017 22:44    ____________________________________________   PROCEDURES  Procedure(s) performed:   Procedures   ____________________________________________   INITIAL IMPRESSION / ASSESSMENT AND PLAN / ED COURSE  Copd exacerbation likely. Will reeval for disposition. Low suspicion for PE/ACS at this time. No e/o infection.   Reevaluation with increased air movement, improved WOB. Still has over 1/2 of continuous albuterol left.   Care to Dr. Roxanne Mins pending reevaluation after breathing treatments for disposition.     Pertinent labs & imaging results that were available during my care of the patient were reviewed by me and considered in my medical decision making (see chart for details).  ____________________________________________  FINAL CLINICAL IMPRESSION(S) / ED DIAGNOSES  Final diagnoses:  None     MEDICATIONS GIVEN DURING THIS VISIT:  Medications  albuterol (PROVENTIL,VENTOLIN) solution  continuous neb (15 mg/hr Nebulization New Bag/Given 05/20/17 2253)  methylPREDNISolone sodium succinate (SOLU-MEDROL) 125 mg/2 mL injection 125 mg ( Intravenous Canceled Entry 05/20/17 2327)  magnesium sulfate IVPB 2 g 50 mL (2 g Intravenous New Bag/Given 05/20/17 2322)  lactated ringers bolus 1,000 mL (1,000 mLs Intravenous New Bag/Given 05/20/17 2321)     NEW OUTPATIENT MEDICATIONS STARTED DURING THIS VISIT:  New Prescriptions   No medications on file    Note:  This note was prepared with assistance of Dragon voice recognition software. Occasional wrong-word or sound-a-like substitutions may have occurred due to the inherent limitations of voice recognition software.   Chavie Kolinski, Corene Cornea, MD 05/21/17 [phone]

## 2017-05-21 ENCOUNTER — Observation Stay (HOSPITAL_COMMUNITY): Payer: Medicaid Other

## 2017-05-21 ENCOUNTER — Encounter (HOSPITAL_COMMUNITY): Payer: Self-pay | Admitting: Radiology

## 2017-05-21 DIAGNOSIS — R0789 Other chest pain: Secondary | ICD-10-CM | POA: Diagnosis not present

## 2017-05-21 DIAGNOSIS — F319 Bipolar disorder, unspecified: Secondary | ICD-10-CM | POA: Diagnosis present

## 2017-05-21 DIAGNOSIS — Z6823 Body mass index (BMI) 23.0-23.9, adult: Secondary | ICD-10-CM | POA: Diagnosis not present

## 2017-05-21 DIAGNOSIS — T380X5A Adverse effect of glucocorticoids and synthetic analogues, initial encounter: Secondary | ICD-10-CM | POA: Diagnosis present

## 2017-05-21 DIAGNOSIS — R6510 Systemic inflammatory response syndrome (SIRS) of non-infectious origin without acute organ dysfunction: Secondary | ICD-10-CM

## 2017-05-21 DIAGNOSIS — R739 Hyperglycemia, unspecified: Secondary | ICD-10-CM | POA: Diagnosis not present

## 2017-05-21 DIAGNOSIS — I10 Essential (primary) hypertension: Secondary | ICD-10-CM | POA: Diagnosis present

## 2017-05-21 DIAGNOSIS — E669 Obesity, unspecified: Secondary | ICD-10-CM | POA: Diagnosis present

## 2017-05-21 DIAGNOSIS — F17210 Nicotine dependence, cigarettes, uncomplicated: Secondary | ICD-10-CM | POA: Diagnosis present

## 2017-05-21 DIAGNOSIS — R0600 Dyspnea, unspecified: Secondary | ICD-10-CM | POA: Diagnosis present

## 2017-05-21 DIAGNOSIS — Z9114 Patient's other noncompliance with medication regimen: Secondary | ICD-10-CM | POA: Diagnosis not present

## 2017-05-21 DIAGNOSIS — Z9221 Personal history of antineoplastic chemotherapy: Secondary | ICD-10-CM | POA: Diagnosis not present

## 2017-05-21 DIAGNOSIS — Z8249 Family history of ischemic heart disease and other diseases of the circulatory system: Secondary | ICD-10-CM | POA: Diagnosis not present

## 2017-05-21 DIAGNOSIS — C61 Malignant neoplasm of prostate: Secondary | ICD-10-CM | POA: Diagnosis present

## 2017-05-21 DIAGNOSIS — J432 Centrilobular emphysema: Secondary | ICD-10-CM | POA: Diagnosis not present

## 2017-05-21 DIAGNOSIS — E1165 Type 2 diabetes mellitus with hyperglycemia: Secondary | ICD-10-CM | POA: Diagnosis present

## 2017-05-21 DIAGNOSIS — R079 Chest pain, unspecified: Secondary | ICD-10-CM | POA: Diagnosis not present

## 2017-05-21 DIAGNOSIS — Z7951 Long term (current) use of inhaled steroids: Secondary | ICD-10-CM | POA: Diagnosis not present

## 2017-05-21 DIAGNOSIS — Z7952 Long term (current) use of systemic steroids: Secondary | ICD-10-CM | POA: Diagnosis not present

## 2017-05-21 DIAGNOSIS — J441 Chronic obstructive pulmonary disease with (acute) exacerbation: Secondary | ICD-10-CM | POA: Diagnosis present

## 2017-05-21 DIAGNOSIS — J9621 Acute and chronic respiratory failure with hypoxia: Secondary | ICD-10-CM | POA: Diagnosis present

## 2017-05-21 DIAGNOSIS — Z79899 Other long term (current) drug therapy: Secondary | ICD-10-CM | POA: Diagnosis not present

## 2017-05-21 DIAGNOSIS — Z720 Tobacco use: Secondary | ICD-10-CM | POA: Diagnosis not present

## 2017-05-21 DIAGNOSIS — E785 Hyperlipidemia, unspecified: Secondary | ICD-10-CM | POA: Diagnosis present

## 2017-05-21 DIAGNOSIS — Z801 Family history of malignant neoplasm of trachea, bronchus and lung: Secondary | ICD-10-CM | POA: Diagnosis not present

## 2017-05-21 DIAGNOSIS — Z85038 Personal history of other malignant neoplasm of large intestine: Secondary | ICD-10-CM | POA: Diagnosis not present

## 2017-05-21 DIAGNOSIS — Z9981 Dependence on supplemental oxygen: Secondary | ICD-10-CM | POA: Diagnosis not present

## 2017-05-21 LAB — GLUCOSE, CAPILLARY
GLUCOSE-CAPILLARY: 229 mg/dL — AB (ref 65–99)
Glucose-Capillary: 252 mg/dL — ABNORMAL HIGH (ref 65–99)
Glucose-Capillary: 233 mg/dL — ABNORMAL HIGH (ref 65–99)
Glucose-Capillary: 122 mg/dL — ABNORMAL HIGH (ref 65–99)

## 2017-05-21 LAB — BASIC METABOLIC PANEL
ANION GAP: 6 (ref 5–15)
Anion gap: 14 (ref 5–15)
BUN: 10 mg/dL (ref 6–20)
BUN: 14 mg/dL (ref 6–20)
CHLORIDE: 92 mmol/L — AB (ref 101–111)
CO2: 35 mmol/L — ABNORMAL HIGH (ref 22–32)
CO2: 30 mmol/L (ref 22–32)
CREATININE: 1.11 mg/dL (ref 0.61–1.24)
Calcium: 8 mg/dL — ABNORMAL LOW (ref 8.9–10.3)
Calcium: 8.2 mg/dL — ABNORMAL LOW (ref 8.9–10.3)
Chloride: 97 mmol/L — ABNORMAL LOW (ref 101–111)
Creatinine, Ser: 0.72 mg/dL (ref 0.61–1.24)
GFR calc Af Amer: >60 mL/min (ref >60)
GFR calc non Af Amer: >60 mL/min (ref >60)
GLUCOSE: 159 mg/dL — AB (ref 65–99)
Glucose, Bld: 327 mg/dL — ABNORMAL HIGH (ref 65–99)
POTASSIUM: 4.2 mmol/L (ref 3.5–5.1)
Potassium: 4.6 mmol/L (ref 3.5–5.1)
SODIUM: 136 mmol/L (ref 135–145)
Sodium: 138 mmol/L (ref 135–145)

## 2017-05-21 LAB — CBC
HCT: 38.6 % — ABNORMAL LOW (ref 39.0–52.0)
Hemoglobin: 12.4 g/dL — ABNORMAL LOW (ref 13.0–17.0)
MCH: 31 pg (ref 26.0–34.0)
MCHC: 32.1 g/dL (ref 30.0–36.0)
MCV: 96.5 fL (ref 78.0–100.0)
PLATELETS: 152 10*3/uL (ref 150–400)
RBC: 4 MIL/uL — ABNORMAL LOW (ref 4.22–5.81)
RDW: 13.4 % (ref 11.5–15.5)
WBC: 9.1 10*3/uL (ref 4.0–10.5)

## 2017-05-21 LAB — BLOOD GAS, ARTERIAL
Acid-Base Excess: 3 mmol/L — ABNORMAL HIGH (ref 0.0–2.0)
Bicarbonate: 28.2 mmol/L — ABNORMAL HIGH (ref 20.0–28.0)
DRAWN BY: 511851
O2 CONTENT: 3 L/min
O2 SAT: 97.5 %
PATIENT TEMPERATURE: 98.4
pCO2 arterial: 52.3 mmHg — ABNORMAL HIGH (ref 32.0–48.0)
pH, Arterial: 7.35 (ref 7.350–7.450)
pO2, Arterial: 93.2 mmHg (ref 83.0–108.0)

## 2017-05-21 LAB — LACTIC ACID, PLASMA
LACTIC ACID, VENOUS: 6.7 mmol/L — AB (ref 0.5–1.9)
LACTIC ACID, VENOUS: 6.7 mmol/L — AB (ref 0.5–1.9)
LACTIC ACID, VENOUS: 4 mmol/L — AB (ref 0.5–1.9)
Lactic Acid, Venous: 5.7 mmol/L (ref 0.5–1.9)

## 2017-05-21 LAB — TSH: TSH: 0.479 u[IU]/mL (ref 0.350–4.500)

## 2017-05-21 LAB — RAPID URINE DRUG SCREEN, HOSP PERFORMED
AMPHETAMINES: NOT DETECTED
Barbiturates: NOT DETECTED
Benzodiazepines: NOT DETECTED
Cocaine: NOT DETECTED
OPIATES: NOT DETECTED
Tetrahydrocannabinol: NOT DETECTED

## 2017-05-21 LAB — TROPONIN I: Troponin I: <0.03 ng/mL (ref <0.03)

## 2017-05-21 LAB — MAGNESIUM: Magnesium: 2.6 mg/dL — ABNORMAL HIGH (ref 1.7–2.4)

## 2017-05-21 LAB — PROCALCITONIN: Procalcitonin: <0.1 ng/mL

## 2017-05-21 MED ORDER — IOPAMIDOL (ISOVUE-370) INJECTION 76%
100.0000 mL | Freq: Once | INTRAVENOUS | Status: AC | PRN
  Administered 2017-05-21: 100 mL via INTRAVENOUS

## 2017-05-21 MED ORDER — VANCOMYCIN HCL IN DEXTROSE 1-5 GM/200ML-% IV SOLN
1000.0000 mg | Freq: Two times a day (BID) | INTRAVENOUS | Status: DC
  Filled 2017-05-21 (×2): qty 200

## 2017-05-21 MED ORDER — SODIUM CHLORIDE 0.9 % IV SOLN
INTRAVENOUS | Status: DC
  Administered 2017-05-21 (×2): via INTRAVENOUS

## 2017-05-21 MED ORDER — PIPERACILLIN-TAZOBACTAM 3.375 G IVPB
3.3750 g | Freq: Three times a day (TID) | INTRAVENOUS | Status: DC
  Filled 2017-05-21 (×2): qty 50

## 2017-05-21 MED ORDER — VANCOMYCIN HCL 10 G IV SOLR
1500.0000 mg | Freq: Once | INTRAVENOUS | Status: AC
  Administered 2017-05-21: 1500 mg via INTRAVENOUS
  Filled 2017-05-21: qty 1500

## 2017-05-21 MED ORDER — PIPERACILLIN-TAZOBACTAM 3.375 G IVPB
3.3750 g | Freq: Three times a day (TID) | INTRAVENOUS | Status: DC
  Administered 2017-05-21: 3.375 g via INTRAVENOUS
  Filled 2017-05-21 (×3): qty 50

## 2017-05-21 MED ORDER — LEVALBUTEROL HCL 0.63 MG/3ML IN NEBU
0.6300 mg | INHALATION_SOLUTION | Freq: Four times a day (QID) | RESPIRATORY_TRACT | Status: DC
  Administered 2017-05-21 – 2017-05-26 (×20): 0.63 mg via RESPIRATORY_TRACT
  Filled 2017-05-21 (×21): qty 3

## 2017-05-21 MED ORDER — BUDESONIDE 0.25 MG/2ML IN SUSP
0.2500 mg | Freq: Two times a day (BID) | RESPIRATORY_TRACT | Status: DC
  Administered 2017-05-21 – 2017-05-24 (×7): 0.25 mg via RESPIRATORY_TRACT
  Filled 2017-05-21 (×8): qty 2

## 2017-05-21 MED ORDER — SODIUM CHLORIDE 0.9 % IV SOLN
INTRAVENOUS | Status: DC
  Administered 2017-05-21: 07:00:00 via INTRAVENOUS

## 2017-05-21 MED ORDER — ONDANSETRON HCL 4 MG PO TABS: 4.0000 mg | ORAL_TABLET | Freq: Four times a day (QID) | ORAL | Status: DC | PRN

## 2017-05-21 MED ORDER — METHYLPREDNISOLONE SODIUM SUCC 40 MG IJ SOLR
40.0000 mg | Freq: Two times a day (BID) | INTRAMUSCULAR | Status: DC
  Administered 2017-05-21 – 2017-05-23 (×5): 40 mg via INTRAVENOUS
  Filled 2017-05-21 (×5): qty 1

## 2017-05-21 MED ORDER — NICOTINE 7 MG/24HR TD PT24
7.0000 mg | MEDICATED_PATCH | Freq: Every day | TRANSDERMAL | Status: DC
  Administered 2017-05-21 – 2017-05-26 (×6): 7 mg via TRANSDERMAL
  Filled 2017-05-21 (×7): qty 1

## 2017-05-21 MED ORDER — LACTATED RINGERS IV SOLN
INTRAVENOUS | Status: DC
  Administered 2017-05-21: 04:00:00 via INTRAVENOUS

## 2017-05-21 MED ORDER — INSULIN ASPART 100 UNIT/ML ~~LOC~~ SOLN
0.0000 [IU] | Freq: Every day | SUBCUTANEOUS | Status: DC
Start: 2017-05-21 — End: 2017-05-27
  Administered 2017-05-22: 3 [IU] via SUBCUTANEOUS
  Administered 2017-05-24 – 2017-05-25 (×2): 5 [IU] via SUBCUTANEOUS

## 2017-05-21 MED ORDER — INSULIN ASPART 100 UNIT/ML ~~LOC~~ SOLN
0.0000 [IU] | Freq: Three times a day (TID) | SUBCUTANEOUS | Status: DC
  Administered 2017-05-22: 3 [IU] via SUBCUTANEOUS
  Administered 2017-05-22 (×2): 2 [IU] via SUBCUTANEOUS
  Administered 2017-05-23: 5 [IU] via SUBCUTANEOUS
  Administered 2017-05-23: 2 [IU] via SUBCUTANEOUS
  Administered 2017-05-24 – 2017-05-25 (×4): 3 [IU] via SUBCUTANEOUS
  Administered 2017-05-25: 2 [IU] via SUBCUTANEOUS
  Administered 2017-05-26: 3 [IU] via SUBCUTANEOUS
  Administered 2017-05-26 (×2): 5 [IU] via SUBCUTANEOUS

## 2017-05-21 MED ORDER — ENOXAPARIN SODIUM 40 MG/0.4ML ~~LOC~~ SOLN
40.0000 mg | SUBCUTANEOUS | Status: DC
  Administered 2017-05-21 – 2017-05-25 (×5): 40 mg via SUBCUTANEOUS
  Filled 2017-05-21 (×4): qty 0.4

## 2017-05-21 MED ORDER — LEVALBUTEROL HCL 0.63 MG/3ML IN NEBU
0.6300 mg | INHALATION_SOLUTION | Freq: Four times a day (QID) | RESPIRATORY_TRACT | Status: DC | PRN
  Administered 2017-05-22 – 2017-05-24 (×3): 0.63 mg via RESPIRATORY_TRACT
  Filled 2017-05-21 (×3): qty 3

## 2017-05-21 MED ORDER — ARIPIPRAZOLE 5 MG PO TABS
5.0000 mg | ORAL_TABLET | Freq: Every day | ORAL | Status: DC
  Administered 2017-05-21 – 2017-05-26 (×6): 5 mg via ORAL
  Filled 2017-05-21 (×6): qty 1

## 2017-05-21 MED ORDER — ESCITALOPRAM OXALATE 20 MG PO TABS
20.0000 mg | ORAL_TABLET | Freq: Every day | ORAL | Status: DC
  Administered 2017-05-21 – 2017-05-26 (×6): 20 mg via ORAL
  Filled 2017-05-21 (×6): qty 1

## 2017-05-21 MED ORDER — INSULIN ASPART 100 UNIT/ML ~~LOC~~ SOLN
0.0000 [IU] | Freq: Three times a day (TID) | SUBCUTANEOUS | Status: DC
  Administered 2017-05-21: 3 [IU] via SUBCUTANEOUS
  Administered 2017-05-21: 5 [IU] via SUBCUTANEOUS
  Administered 2017-05-21: 3 [IU] via SUBCUTANEOUS

## 2017-05-21 MED ORDER — SODIUM CHLORIDE 0.9 % IV BOLUS
2000.0000 mL | Freq: Once | INTRAVENOUS | Status: AC
  Administered 2017-05-21: 1000 mL via INTRAVENOUS

## 2017-05-21 MED ORDER — ACETAMINOPHEN 650 MG RE SUPP: 650.0000 mg | Freq: Four times a day (QID) | RECTAL | Status: DC | PRN

## 2017-05-21 MED ORDER — ACETAMINOPHEN 325 MG PO TABS
650.0000 mg | ORAL_TABLET | Freq: Four times a day (QID) | ORAL | Status: DC | PRN
  Administered 2017-05-21 – 2017-05-26 (×4): 650 mg via ORAL
  Filled 2017-05-21 (×5): qty 2

## 2017-05-21 MED ORDER — LEVOFLOXACIN IN D5W 750 MG/150ML IV SOLN
750.0000 mg | INTRAVENOUS | Status: DC
  Filled 2017-05-21: qty 150

## 2017-05-21 MED ORDER — IOPAMIDOL (ISOVUE-370) INJECTION 76%
INTRAVENOUS | Status: AC
  Filled 2017-05-21: qty 100

## 2017-05-21 MED ORDER — ONDANSETRON HCL 4 MG/2ML IJ SOLN: 4.0000 mg | Freq: Four times a day (QID) | INTRAMUSCULAR | Status: DC | PRN

## 2017-05-21 MED ORDER — FLUTICASONE PROPIONATE HFA 110 MCG/ACT IN AERO: 2.0000 | INHALATION_SPRAY | Freq: Two times a day (BID) | RESPIRATORY_TRACT | Status: DC

## 2017-05-21 MED ORDER — PRAVASTATIN SODIUM 20 MG PO TABS
20.0000 mg | ORAL_TABLET | Freq: Every day | ORAL | Status: DC
  Administered 2017-05-21 – 2017-05-22 (×2): 20 mg via ORAL
  Filled 2017-05-21 (×2): qty 1

## 2017-05-21 MED ORDER — LEVOFLOXACIN IN D5W 750 MG/150ML IV SOLN
750.0000 mg | Freq: Once | INTRAVENOUS | Status: AC
  Administered 2017-05-21: 750 mg via INTRAVENOUS
  Filled 2017-05-21: qty 150

## 2017-05-21 NOTE — Progress Notes (Addendum)
Pharmacy Antibiotic Note  KANAN SOBEK is a 62 y.o. male admitted on 05/20/2017 with pneumonia and zosyn. Pharmacy has been consulted for Levaquin, vancomycin and zosyn dosing.  Plan: Levaquin 750mg  IV Q24H. Vancomycin 1500 mg IV x 1 then 1gm IV q12 hours Zosyn 3.375 gm IV q8 hours  Height: 5\' 10"  (177.8 cm) Weight: 165 lb (74.8 kg) IBW/kg (Calculated) : 73  Temp (24hrs), Avg:98.4 F (36.9 C), Min:98.4 F (36.9 C), Max:98.4 F (36.9 C)  Recent Labs  Lab 05/14/17 0415 05/16/17 0420 05/20/17 2157 05/20/17 2327  WBC 9.2  --  10.2  --   CREATININE 0.85 0.81  --  0.72    Estimated Creatinine Clearance: 98.9 mL/min (by C-G formula based on SCr of 0.72 mg/dL).    Allergies  Allergen Reactions  . Codeine Nausea And Vomiting     Thank you for allowing pharmacy to be a part of this patient's care.  Excell Seltzer, PharmD 05/21/2017 2:55 AM

## 2017-05-21 NOTE — H&P (Addendum)
History and Physical    Taylor [name] RJJ:[id number] DOB: [birth year] DOA: 05/20/2017  PCP: Taylor [name], No Pcp Per  Taylor [name] coming from: Home.  Chief Complaint: Weakness.  HPI: Taylor [name] is a 62 y.o. male with history of COPD, bipolar disorder, hyperlipidemia discharged home 2 days ago presents to the ER with complaints of shortness of breath and weakness.  Taylor [name] states since going home Taylor [name] fell a week sometimes dizzy denies losing consciousness.  Has been having persistent shortness of breath with wheezing and cough with productive sputum.  When he coughs he has some chest pain otherwise chest pain-free.  Denies nausea vomiting diarrhea.  Has been taking his prednisone after discharge.   ED Course: In the ER Taylor [name] is found to be wheezing and was placed on nebulizer treatment.  Chest x-ray does not show any definite infiltrates.  EKG shows sinus tachycardia.  Taylor [name] was also having low normal blood pressure and was given fluid bolus.  On my exam Taylor [name] is not in distress but still tachycardic.  Complains of frontal headache which is new.  Taylor [name] admitted for SIRS with respiratory failure.  Review of Systems: As per HPI, rest all negative.   Past Medical History:  Diagnosis Date  . Bipolar 1 disorder ([place]) 02/05/2012  . Cancer ([place]) 04/11/11   adenocarcinoma of colon, 7/19 nodes pos.FINISHED CHEMO/DR. SHERRILL  . COPD (chronic obstructive pulmonary disease) ([place])    SMOKER  . Full dentures   . Hemorrhoids   . Inguinal hernia    RIGHT- PAINFUL  . Prostate cancer ([place])    Gleason score = 7, supposed to have radiation therapy but he has not followed up  . Rib fractures    hx of  . Shortness of breath     Past Surgical History:  Procedure Laterality Date  . COLON SURGERY  04/11/11   Sigmoid colectomy  . COLOSTOMY REVISION  04/11/2011   Procedure: COLON RESECTION SIGMOID;  Surgeon: [name], MD;  Location: [facility];  Service: General;  Laterality: N/A;  low  anterior colon resection   . INGUINAL HERNIA REPAIR Right 05/01/2012   Procedure: HERNIA REPAIR INGUINAL ADULT;  Surgeon: [name], MD;  Location: [facility];  Service: General;  Laterality: Right;  . INSERTION OF MESH Right 05/01/2012   Procedure: INSERTION OF MESH;  Surgeon: [name], MD;  Location: [facility];  Service: General;  Laterality: Right;  . PORT-A-CATH REMOVAL Left 05/01/2012   Procedure: REMOVAL Infusion Port;  Surgeon: [name], MD;  Location: [facility];  Service: General;  Laterality: Left;  . PORTACATH PLACEMENT    . PORTACATH PLACEMENT  05/02/2011   Procedure: INSERTION PORT-A-CATH;  Surgeon: [name], MD;  Location: [facility];  Service: General;  Laterality: N/A;     reports that he quit smoking about 15 months ago. His smoking use included cigarettes. He has a 45.00 pack-year smoking history. He has quit using smokeless tobacco. He reports that he does not drink alcohol or use drugs.  Allergies  Allergen Reactions  . Codeine Nausea And Vomiting    Family History  Problem Relation Age of Onset  . Heart disease Father   . Lung cancer Maternal Uncle        smoked    Prior to Admission medications   Medication Sig Start Date End Date Taking? Authorizing Provider  albuterol (PROVENTIL) (2.5 MG/3ML) 0.083% nebulizer solution Take 3 mLs (2.5 mg total) by nebulization every 6 (six) hours as  needed for wheezing or shortness of breath. Dx: J44.9 05/18/17 06/17/17 Yes Nita Sells, MD  ARIPiprazole (ABILIFY) 5 MG tablet Take 1 tablet (5 mg total) by mouth every morning. 05/18/17  Yes Nita Sells, MD  escitalopram (LEXAPRO) 20 MG tablet Take 1 tablet (20 mg total) by mouth every morning. 05/18/17  Yes Nita Sells, MD  fluticasone (FLOVENT HFA) 110 MCG/ACT inhaler Inhale 2 puffs into the lungs 2 (two) times daily. 05/18/17 07/17/17 Yes Nita Sells, MD  lovastatin (MEVACOR) 20 MG tablet Take 1 tablet (20 mg total) by mouth at bedtime. 05/18/17  Yes  Nita Sells, MD  mometasone-formoterol (DULERA) 100-5 MCG/ACT AERO Inhale 2 puffs into the lungs 2 (two) times daily. 05/18/17  Yes Nita Sells, MD  OXYGEN Inhale 3 L into the lungs.    Yes [provider]  predniSONE (DELTASONE) 20 MG tablet Take 2 tablets (40 mg total) by mouth daily before breakfast. 05/19/17  Yes Nita Sells, MD  tiotropium (SPIRIVA) 18 MCG inhalation capsule Place 1 capsule (18 mcg total) into inhaler and inhale daily. 05/18/17 06/17/17 Yes Nita Sells, MD    Physical Exam: Vitals:   05/21/17 0015 05/21/17 0045 05/21/17 0200 05/21/17 0215  BP: (!) 106/91 (!) 95/46 112/64 125/65  Pulse: (!) 137 (!) 122 (!) 129   Resp: (!) 27 (!) 23 (!) 25 (!) 23  Temp:      TempSrc:      SpO2: 96% 92% 92%   Weight:      Height:          Constitutional: Moderately built and nourished. Vitals:   05/21/17 0015 05/21/17 0045 05/21/17 0200 05/21/17 0215  BP: (!) 106/91 (!) 95/46 112/64 125/65  Pulse: (!) 137 (!) 122 (!) 129   Resp: (!) 27 (!) 23 (!) 25 (!) 23  Temp:      TempSrc:      SpO2: 96% 92% 92%   Weight:      Height:       Eyes: Anicteric no pallor. ENMT: No discharge from the ears eyes nose or mouth. Neck: No JVD appreciated no mass flow. Respiratory: Bilateral expiratory wheeze and no crepitations. Cardiovascular: S1-S2 heard tachycardic. Abdomen: Soft nontender bowel sounds present. Musculoskeletal: No edema.  No joint effusion. Skin: No rash.  Skin appears warm. Neurologic: Alert awake oriented to time place and person.  Moves all extremities. Psychiatric: Appears normal.  Normal affect.   Labs on Admission: I have personally reviewed following labs and imaging studies  CBC: Recent Labs  Lab 05/14/17 0415 05/20/17 2157  WBC 9.2 10.2  HGB 11.3* 14.0  HCT 35.6* 42.3  MCV 96.5 94.2  PLT 166 102   Basic Metabolic Panel: Recent Labs  Lab 05/14/17 0415 05/16/17 0420 05/20/17 2327  NA 150* 138 138  K 5.0  4.3 4.6  CL 104 93* 97*  CO2 40* 32 35*  GLUCOSE 344* 226* 159*  BUN 12 15 10   CREATININE 0.85 0.81 0.72  CALCIUM 8.4* 8.6* 8.0*   GFR: Estimated Creatinine Clearance: 98.9 mL/min (by C-G formula based on SCr of 0.72 mg/dL). Liver Function Tests: No results for input(s): AST, ALT, ALKPHOS, BILITOT, PROT, ALBUMIN in the last 168 hours. No results for input(s): LIPASE, AMYLASE in the last 168 hours. No results for input(s): AMMONIA in the last 168 hours. Coagulation Profile: No results for input(s): INR, PROTIME in the last 168 hours. Cardiac Enzymes: No results for input(s): CKTOTAL, CKMB, CKMBINDEX, TROPONINI in the last 168 hours. BNP (last 3  results) No results for input(s): PROBNP in the last 8760 hours. HbA1C: No results for input(s): HGBA1C in the last 72 hours. CBG: Recent Labs  Lab 05/17/17 0606 05/17/17 1222 05/17/17 1556 05/17/17 2120 05/18/17 0557  GLUCAP 192* 198* 121* 131* 103*   Lipid Profile: No results for input(s): CHOL, HDL, LDLCALC, TRIG, CHOLHDL, LDLDIRECT in the last 72 hours. Thyroid Function Tests: No results for input(s): TSH, T4TOTAL, FREET4, T3FREE, THYROIDAB in the last 72 hours. Anemia Panel: No results for input(s): VITAMINB12, FOLATE, FERRITIN, TIBC, IRON, RETICCTPCT in the last 72 hours. Urine analysis:    Component Value Date/Time   COLORURINE YELLOW 04/18/2017 0809   APPEARANCEUR CLEAR 04/18/2017 0809   LABSPEC 1.023 04/18/2017 0809   PHURINE 7.0 04/18/2017 0809   GLUCOSEU >=500 (A) 04/18/2017 0809   HGBUR NEGATIVE 04/18/2017 0809   BILIRUBINUR NEGATIVE 04/18/2017 0809   KETONESUR 5 (A) 04/18/2017 0809   PROTEINUR NEGATIVE 04/18/2017 0809   UROBILINOGEN 1.0 01/09/2013 1843   NITRITE NEGATIVE 04/18/2017 0809   LEUKOCYTESUR NEGATIVE 04/18/2017 0809   Sepsis Labs: @LABRCNTIP (procalcitonin:4,lacticidven:4) ) Recent Results (from the past 240 hour(s))  Respiratory Panel by PCR     Status: None   Collection Time: 05/13/17  2:40 PM    Result Value Ref Range Status   Adenovirus NOT DETECTED NOT DETECTED Final   Coronavirus 229E NOT DETECTED NOT DETECTED Final   Coronavirus HKU1 NOT DETECTED NOT DETECTED Final   Coronavirus NL63 NOT DETECTED NOT DETECTED Final   Coronavirus OC43 NOT DETECTED NOT DETECTED Final   Metapneumovirus NOT DETECTED NOT DETECTED Final   Rhinovirus / Enterovirus NOT DETECTED NOT DETECTED Final   Influenza A NOT DETECTED NOT DETECTED Final   Influenza B NOT DETECTED NOT DETECTED Final   Parainfluenza Virus 1 NOT DETECTED NOT DETECTED Final   Parainfluenza Virus 2 NOT DETECTED NOT DETECTED Final   Parainfluenza Virus 3 NOT DETECTED NOT DETECTED Final   Parainfluenza Virus 4 NOT DETECTED NOT DETECTED Final   Respiratory Syncytial Virus NOT DETECTED NOT DETECTED Final   Bordetella pertussis NOT DETECTED NOT DETECTED Final   Chlamydophila pneumoniae NOT DETECTED NOT DETECTED Final   Mycoplasma pneumoniae NOT DETECTED NOT DETECTED Final    Comment: Performed at [facility], [place] [address], [place], [name] [address]     Radiological Exams on Admission: Dg Chest 2 View  Result Date: 05/20/2017 CLINICAL DATA:  Shortness of breath EXAM: CHEST - 2 VIEW COMPARISON:  05/13/2017 FINDINGS: Lungs are clear.  No pleural effusion or pneumothorax. The heart is normal in size. Mild degenerative changes of the visualized thoracolumbar spine. IMPRESSION: Normal chest radiographs. Electronically Signed   By: Julian Hy M.D.   On: 05/20/2017 22:44    EKG: Independently reviewed.  Sinus tachycardia.  Assessment/Plan Principal Problem:   SIRS (systemic inflammatory response syndrome) ([facility]) Active Problems:   Bipolar 1 disorder (H[facility])   COPD with acute exacerbation ([place])    1. SIRS -cause not clear.  Will continue with hydration.  I have ordered blood cultures procalcitonin levels lactate levels troponin 2D echo I have also ordered a CT angiogram of the chest and head and I have placed Taylor [name] on  Levaquin for now and continue with IV Solu-Medrol we will check a cortisol level.  Admit to stepdown. 2. Acute exacerbation of COPD on Xopenex since Taylor [name] is tachycardic.  Will continue with Pulmicort IV steroids.  On Levaquin. 3. Tachycardia -we will get cardiology opinion to see if this tachycardia is any abnormal  rhythm like atrial flutter.Check TSH. 4. Bipolar disorder on Lexapro and Abilify. 5. Hyperlipidemia on statins.  Addendum -repeat EKG was done which did show sinus tachycardia with discussed with cardiologist Dr. Sallyanne Kuster who reviewed EKG and felt it was sinus tachycardia.  DVT prophylaxis: Lovenox. Code Status: DO NOT INTUBATE. Family Communication: Discussed with Taylor [name]. Disposition Plan: Home. Consults called: None. Admission status: Inpatient.   Rise Patience MD [facility] Pager [phone].  If 7PM-7AM, please contact night-coverage [email or web address] Password [email or web address]  05/21/2017, 2:52 AM

## 2017-05-21 NOTE — Progress Notes (Addendum)
CRITICAL VALUE STICKER  CRITICAL VALUE: lactic acid 5.7   RECEIVER : Sam Nyleah Mcginnis RN  MD NOTIFIED: [place]  RESPONSE: no orders at this time

## 2017-05-21 NOTE — ED Provider Notes (Signed)
Care assumed from Dr. Dayna Barker, patient presented with COPD exacerbation and complaints of generalized weakness.  He has received several nebulizer treatments including a continuous nebulizer.  Following this, he is still dyspneic at rest with some mild residual wheezes.  He is tachypneic at rest and also tachycardic.  He continues to feel generally weak.  He does not seem to be improved enough for discharge.  Case is discussed with Dr. Hal Hope of [facility], who agrees to come evaluate the patient for possible admission.   Delora Fuel, MD 40/81/44 [phone]

## 2017-05-21 NOTE — Progress Notes (Signed)
CRITICAL VALUE STICKER  CRITICAL VALUE: lactic acid 4.0   RECEIVER: Rolland Porter  MD NOTIFIED: [place]  RESPONSE: no orders at this time

## 2017-05-21 NOTE — Progress Notes (Signed)
CRITICAL VALUE ALERT  Critical Value:  Lactic acid 6.7  Date & Time Notied:  05/21/17 @ 0177  Provider Notified: Hal Hope  Orders Received/Actions taken: new orders received and implemented to change fluids and labs ordered

## 2017-05-21 NOTE — Progress Notes (Signed)
PROGRESS NOTE   Taylor [name]  JAS:[id number]    DOB: 09/22/1955    DOA: 05/20/2017  PCP: Patient, No Pcp Per   I have briefly reviewed patients previous medical records in [facility].  Brief Narrative:  62 year old male with PMH of COPD, chronic respiratory failure with hypoxia on home oxygen 2.5-3 L/min continuously, ongoing tobacco abuse, bipolar disorder, prostate and colon cancer, hospitalized 4/7-4/11 for COPD exacerbation presented to ED 05/20/2017 with worsening dyspnea, weakness, dizziness, wheezing and cough with some sputum.  CT chest negative for PE or acute abnormalities.  Admitted for COPD exacerbation, acute on chronic hypoxic respiratory failure and elevated lactate.  Improving.   Assessment & Plan:   Principal Problem:   SIRS (systemic inflammatory response syndrome) (HCC) Active Problems:   Bipolar 1 disorder (H[facility])   COPD with acute exacerbation ([facility])   COPD exacerbation: May have been precipitated by ongoing tobacco abuse, allergies.  RSV 4/23 was negative.  CTA chest 5/1: No PE or active pulmonary disease.  Emphysema noted.  Continue Xopenex nebulizations, Pulmicort, IV Solu-Medrol 40 mg every 12 hours.  Recently completed course of doxycycline.  Procalcitonin <0.1.  No clear infectious etiology and hence will discontinue empirically started Zosyn, vancomycin and levofloxacin.  Flutter valve added.  Tobacco cessation counseled.  Acute on chronic hypoxic respiratory failure: Secondary to COPD exacerbation.  Management as above and wean to home oxygen as tolerated.  Elevated lactate: Likely related to acute respiratory failure.  Hydrated with IV fluids.  Improving.  Follow lactate in a.m.  SIRS: Likely secondary to respiratory distress.  No obvious focus of sepsis.  EKG reviewed by admitting physician with cardiology and noted sinus tachycardia.  Improving.  TSH normal.  Tobacco abuse: Cessation counseled.  Patient requests nicotine patch, added.  Bipolar  disorder: Stable.  Continue Lexapro and Abilify.  Hyperlipidemia: Statins.  Uncontrolled DM2: A1c 04/29/2017: 6.5.  Now precipitated by steroids.  Added SSI.     DVT prophylaxis: Lovenox Code Status: DNI Family Communication: None at bedside Disposition: DC home pending clinical improvement.  Transferred from stepdown to telemetry.   Consultants:  None  Procedures:  None  Antimicrobials:  Discontinued vancomycin, levofloxacin and Zosyn 5/1.   Subjective: Reports that he stays at a transition home and is independent.  On oxygen at home 2.5-3 L/min.  Smokes 2 to 3 cigarettes/day.  Since admission feels better.  Cough mostly dry.  No chest pain.  Dyspnea improved.  Feels generally weak.  Patient was interviewed and examined with RN in room.  ROS: As above.  Objective:  Vitals:   05/21/17 0834 05/21/17 0851 05/21/17 1234 05/21/17 1504  BP: 107/61  127/85 127/85  Pulse: 86  95 86  Resp: (!) 22  (!) 22 16  Temp:  98.4 F (36.9 C)    TempSrc:  Oral    SpO2: 96%  96% 94%  Weight:      Height:        Examination:  General exam: Pleasant middle-aged male, moderately built and nourished lying comfortably propped up in bed without distress. Respiratory system: Slightly harsh breath sounds with occasional expiratory rhonchi but no crackles. Respiratory effort normal.  Able to speak in full sentences Cardiovascular system: S1 & S2 heard, RRR. No JVD, murmurs, rubs, gallops or clicks. No pedal edema.  Telemetry personally reviewed: SR-ST in the 120s. Gastrointestinal system: Abdomen is nondistended, soft and nontender. No organomegaly or masses felt. Normal bowel sounds heard. Central nervous system: Alert and oriented. No focal  neurological deficits. Extremities: Symmetric 5 x 5 power. Skin: No rashes, lesions or ulcers Psychiatry: Judgement and insight appear normal. Mood & affect appropriate.     Data Reviewed: I have personally reviewed following labs and imaging  studies  CBC: Recent Labs  Lab 05/20/17 2157 05/21/17 0305  WBC 10.2 9.1  HGB 14.0 12.4*  HCT 42.3 38.6*  MCV 94.2 96.5  PLT 203 756   Basic Metabolic Panel: Recent Labs  Lab 05/16/17 0420 05/20/17 2327 05/21/17 0305  NA 138 138 136  K 4.3 4.6 4.2  CL 93* 97* 92*  CO2 32 35* 30  GLUCOSE 226* 159* 327*  BUN 15 10 14   CREATININE 0.81 0.72 1.11  CALCIUM 8.6* 8.0* 8.2*  MG  --   --  2.6*   Cardiac Enzymes: Recent Labs  Lab 05/21/17 0305 05/21/17 0757 05/21/17 1514  TROPONINI <0.03 <0.03 <0.03   HbA1C: No results for input(s): HGBA1C in the last 72 hours. CBG: Recent Labs  Lab 05/17/17 1556 05/17/17 2120 05/18/17 0557 05/21/17 0856 05/21/17 1155  GLUCAP 121* 131* 103* 229* 252*    Recent Results (from the past 240 hour(s))  Respiratory Panel by PCR     Status: None   Collection Time: 05/13/17  2:40 PM  Result Value Ref Range Status   Adenovirus NOT DETECTED NOT DETECTED Final   Coronavirus 229E NOT DETECTED NOT DETECTED Final   Coronavirus HKU1 NOT DETECTED NOT DETECTED Final   Coronavirus NL63 NOT DETECTED NOT DETECTED Final   Coronavirus OC43 NOT DETECTED NOT DETECTED Final   Metapneumovirus NOT DETECTED NOT DETECTED Final   Rhinovirus / Enterovirus NOT DETECTED NOT DETECTED Final   Influenza A NOT DETECTED NOT DETECTED Final   Influenza B NOT DETECTED NOT DETECTED Final   Parainfluenza Virus 1 NOT DETECTED NOT DETECTED Final   Parainfluenza Virus 2 NOT DETECTED NOT DETECTED Final   Parainfluenza Virus 3 NOT DETECTED NOT DETECTED Final   Parainfluenza Virus 4 NOT DETECTED NOT DETECTED Final   Respiratory Syncytial Virus NOT DETECTED NOT DETECTED Final   Bordetella pertussis NOT DETECTED NOT DETECTED Final   Chlamydophila pneumoniae NOT DETECTED NOT DETECTED Final   Mycoplasma pneumoniae NOT DETECTED NOT DETECTED Final    Comment: Performed at [facility], [place] [address] [place], [place] [address]         Radiology Studies: Dg Chest 2  View  Result Date: 05/20/2017 CLINICAL DATA:  Shortness of breath EXAM: CHEST - 2 VIEW COMPARISON:  05/13/2017 FINDINGS: Lungs are clear.  No pleural effusion or pneumothorax. The heart is normal in size. Mild degenerative changes of the visualized thoracolumbar spine. IMPRESSION: Normal chest radiographs. Electronically Signed   By: Julian Hy M.D.   On: 05/20/2017 22:44   Ct Head Wo Contrast  Result Date: 05/21/2017 CLINICAL DATA:  Headache and generalize weakness for 2 days. EXAM: CT HEAD WITHOUT CONTRAST TECHNIQUE: Contiguous axial images were obtained from the base of the skull through the vertex without intravenous contrast. COMPARISON:  12/04/2016 FINDINGS: Brain: No evidence of acute infarction, hemorrhage, hydrocephalus, extra-axial collection or mass lesion/mass effect. Vascular: No hyperdense vessel or unexpected calcification. Skull: Normal. Negative for fracture or focal lesion. Sinuses/Orbits: No acute finding. Other: None. IMPRESSION: No acute intracranial abnormalities. Electronically Signed   By: Lucienne Capers M.D.   On: 05/21/2017 03:03   Ct Angio Chest Pe W Or Wo Contrast  Result Date: 05/21/2017 CLINICAL DATA:  Sternal pain for 2 days.  History of COPD. EXAM:  CT ANGIOGRAPHY CHEST WITH CONTRAST TECHNIQUE: Multidetector CT imaging of the chest was performed using the standard protocol during bolus administration of intravenous contrast. Multiplanar CT image reconstructions and MIPs were obtained to evaluate the vascular anatomy. CONTRAST:  167mL ISOVUE-370 IOPAMIDOL (ISOVUE-370) INJECTION 76% COMPARISON:  None. FINDINGS: Cardiovascular: Good opacification of the central and segmental pulmonary arteries. No focal filling defects. No evidence of significant pulmonary embolus. Normal heart size. No pericardial effusion. Normal caliber thoracic aorta. No aortic dissection. Great vessel origins are patent. Coronary artery calcifications. Mediastinum/Nodes: No enlarged mediastinal,  hilar, or axillary lymph nodes. Thyroid gland, trachea, and esophagus demonstrate no significant findings. Lungs/Pleura: Emphysematous changes in the lungs. Bronchial wall thickening consistent with chronic bronchitis. No airspace disease or consolidation. No pleural effusions. No pneumothorax. Airways are patent. Upper Abdomen: No acute process demonstrated in the visualized upper abdomen. Musculoskeletal: Old right third rib deformity. Degenerative changes in the spine. No destructive bone lesions. Review of the MIP images confirms the above findings. IMPRESSION: 1. No evidence of significant pulmonary embolus. 2. Emphysematous changes in the lungs. No evidence of active pulmonary disease. 3. Mild coronary artery calcifications. Emphysema (ICD10-J43.9). Electronically Signed   By: Lucienne Capers M.D.   On: 05/21/2017 03:08        Scheduled Meds: . ARIPiprazole  5 mg Oral Daily  . budesonide (PULMICORT) nebulizer solution  0.25 mg Nebulization BID  . enoxaparin (LOVENOX) injection  40 mg Subcutaneous Q24H  . escitalopram  20 mg Oral Daily  . insulin aspart  0-9 Units Subcutaneous TID WC  . levalbuterol  0.63 mg Nebulization Q6H  . methylPREDNISolone (SOLU-MEDROL) injection  40 mg Intravenous Q12H  . nicotine  7 mg Transdermal Daily  . pravastatin  20 mg Oral q1800   Continuous Infusions: . sodium chloride 150 mL/hr at 05/21/17 0720  . [START ON 05/22/2017] levofloxacin (LEVAQUIN) IV    . piperacillin-tazobactam (ZOSYN)  IV    . vancomycin       LOS: 0 days     Vernell Leep, MD, FACP, [facility]. [facility] Pager [phone]  If 7PM-7AM, please contact night-coverage [email or web address] Password [email or web address] 05/21/2017, 5:13 PM

## 2017-05-21 NOTE — Progress Notes (Signed)
Inpatient Diabetes Program Recommendations  AACE/ADA: New Consensus Statement on Inpatient Glycemic Control (2015)  Target Ranges:  Prepandial:   less than 140 mg/dL      Peak postprandial:   less than 180 mg/dL (1-2 hours)      Critically ill patients:  140 - 180 mg/dL   Lab Results  Component Value Date   GLUCAP 252 (H) 05/21/2017   HGBA1C 6.5 (H) 04/29/2017    Review of Glycemic Control Results for Taylor [name], Taylor [name] (MRN [id number]) as of 05/21/2017 13:16  Ref. Range 05/17/2017 21:20 05/18/2017 05:57 05/21/2017 08:56 05/21/2017 11:55  Glucose-Capillary Latest Ref Range: 65 - 99 mg/dL 131 (H) 103 (H) 229 (H) 252 (H)   Admit with: COPD  No History of DM (6.5 on last admission)  Current Insulin Orders: Novolog Sensitive Correction Scale/ SSI (0-9 units) TID AC   MD- Note patient currently receiving Solumedrol 40 mg Q12 hours.  Steroids likely worsening glucose control.  Please consider the following in-hospital insulin regimen while patient getting steroids: -Start Lantus 10 units daily (0.15 units/kg dosing based on weight of 73 kg) -Start Novolog Meal Coverage: Novolog 4 units TID with meals (hold if pt eats <50% of meal)  3. Add Novolog hs correction 0-5  Will likely need to taper insulin quickly as steroids are reduced.  Thank you, Nani Gasser. Andreya Lacks, RN, MSN, CDE  Diabetes Coordinator Inpatient Glycemic Control Team Team Pager [phone] (8am-5pm) 05/21/2017 1:16 PM

## 2017-05-21 NOTE — Progress Notes (Addendum)
NURSING PROGRESS NOTE  Taylor [name] [id number] Admission Data: 05/21/2017 5:42 AM Attending Provider: Rise Patience, MD JHE:[id number], No Pcp Per Code Status: Partial Code - NO INTUBATION  Taylor [name] is a 62 y.o. male patient admitted from ED:  -No acute distress noted.  -Shortness of breath with exertion and at rest.  -No complaints of chest pain.   Cardiac Monitoring: Progressive monitor Cardiac monitor yields:sinus tachycardia.  Blood pressure 122/72, pulse (!) 131, temperature 98.4 F (36.9 C), temperature source Oral, resp. rate (!) 29, height 5\' 11"  (1.803 m), weight 76.1 kg (167 lb 12.3 oz), SpO2 99 %.   IV Fluids:  IV in place, occlusive dsg intact without redness, IV cath hand left, condition patent and no redness, lactated ringers; and antecubital right, condition patent and no redness normal saline.   Allergies:  Codeine  Past Medical History:   has a past medical history of Bipolar 1 disorder ([place]) (02/05/2012), Cancer ([place]) (04/11/11), COPD (chronic obstructive pulmonary disease) ([place]), Full dentures, Hemorrhoids, Inguinal hernia, Prostate cancer ([place]), Rib fractures, and Shortness of breath.  Past Surgical History:   has a past surgical history that includes Colostomy revision (04/11/2011); Colon surgery (04/11/11); Portacath placement; Portacath placement (05/02/2011); Inguinal hernia repair (Right, 05/01/2012); Insertion of mesh (Right, 05/01/2012); and Port-a-cath removal (Left, 05/01/2012).  Social History:   reports that he quit smoking about 15 months ago. His smoking use included cigarettes. He has a 45.00 pack-year smoking history. He has quit using smokeless tobacco. He reports that he does not drink alcohol or use drugs.  Skin: scab to anterior L foot  Patient/Family orientated to room. Information packet given to patient/family. Admission inpatient armband information verified with patient/family to include name and date of birth and placed on  patient arm. Side rails up x 2, fall assessment and education completed with patient/family. Patient/family able to verbalize understanding of risk associated with falls and verbalized understanding to call for assistance before getting out of bed. Call light within reach. Patient/family able to voice and demonstrate understanding of unit orientation instructions.

## 2017-05-22 ENCOUNTER — Inpatient Hospital Stay (HOSPITAL_COMMUNITY): Payer: Medicaid Other

## 2017-05-22 ENCOUNTER — Other Ambulatory Visit: Payer: Self-pay

## 2017-05-22 ENCOUNTER — Encounter (HOSPITAL_COMMUNITY): Payer: Self-pay | Admitting: General Practice

## 2017-05-22 DIAGNOSIS — J9621 Acute and chronic respiratory failure with hypoxia: Secondary | ICD-10-CM

## 2017-05-22 DIAGNOSIS — I5030 Unspecified diastolic (congestive) heart failure: Secondary | ICD-10-CM

## 2017-05-22 DIAGNOSIS — J441 Chronic obstructive pulmonary disease with (acute) exacerbation: Principal | ICD-10-CM

## 2017-05-22 LAB — BASIC METABOLIC PANEL
Anion gap: 5 (ref 5–15)
BUN: 11 mg/dL (ref 6–20)
CALCIUM: 8 mg/dL — AB (ref 8.9–10.3)
CO2: 34 mmol/L — AB (ref 22–32)
CREATININE: 0.75 mg/dL (ref 0.61–1.24)
Chloride: 97 mmol/L — ABNORMAL LOW (ref 101–111)
GFR calc non Af Amer: >60 mL/min (ref >60)
GLUCOSE: 218 mg/dL — AB (ref 65–99)
Potassium: 4.6 mmol/L (ref 3.5–5.1)
Sodium: 136 mmol/L (ref 135–145)

## 2017-05-22 LAB — GLUCOSE, CAPILLARY
GLUCOSE-CAPILLARY: 177 mg/dL — AB (ref 65–99)
GLUCOSE-CAPILLARY: 195 mg/dL — AB (ref 65–99)
Glucose-Capillary: 163 mg/dL — ABNORMAL HIGH (ref 65–99)
Glucose-Capillary: 211 mg/dL — ABNORMAL HIGH (ref 65–99)
Glucose-Capillary: 232 mg/dL — ABNORMAL HIGH (ref 65–99)

## 2017-05-22 LAB — TROPONIN I

## 2017-05-22 LAB — ECHOCARDIOGRAM COMPLETE
Height: 71 in
Weight: 2684.32 oz

## 2017-05-22 LAB — LACTIC ACID, PLASMA: Lactic Acid, Venous: 1.9 mmol/L (ref 0.5–1.9)

## 2017-05-22 MED ORDER — PERFLUTREN LIPID MICROSPHERE
1.0000 mL | INTRAVENOUS | Status: AC | PRN
  Administered 2017-05-22: 3 mL via INTRAVENOUS
  Filled 2017-05-22: qty 10

## 2017-05-22 MED ORDER — ORAL CARE MOUTH RINSE
15.0000 mL | Freq: Two times a day (BID) | OROMUCOSAL | Status: DC
  Administered 2017-05-23 – 2017-05-26 (×6): 15 mL via OROMUCOSAL

## 2017-05-22 MED ORDER — ASPIRIN 81 MG PO CHEW
324.0000 mg | CHEWABLE_TABLET | Freq: Every day | ORAL | Status: DC
  Administered 2017-05-22 – 2017-05-26 (×5): 324 mg via ORAL
  Filled 2017-05-22 (×6): qty 4

## 2017-05-22 MED ORDER — NITROGLYCERIN 0.4 MG SL SUBL
SUBLINGUAL_TABLET | SUBLINGUAL | Status: AC
  Administered 2017-05-22: 0.4 mg
  Filled 2017-05-22: qty 1

## 2017-05-22 MED ORDER — NITROGLYCERIN 0.4 MG SL SUBL
0.4000 mg | SUBLINGUAL_TABLET | SUBLINGUAL | Status: DC | PRN
  Administered 2017-05-22 – 2017-05-23 (×3): 0.4 mg via SUBLINGUAL

## 2017-05-22 NOTE — Progress Notes (Signed)
  Echocardiogram 2D Echocardiogram has been performed.  Darlina Sicilian M 05/22/2017, 10:37 AM

## 2017-05-22 NOTE — Progress Notes (Signed)
Addendum  Paged by RN that approximately 505 p.m. stating that patient had chest pain.  I personally went to bedside, interviewed patient and examined.  He reported new onset of mid chest pressure-like pain that started approximately an hour ago, rated as 8/10 in severity, nonradiating, associated with some sweating but no dyspnea, had relieved with 2 sublingual NTG that he received prior to my arriving there.  He reports ongoing intermittent similar chest discomfort with or without activity.  Not consistent with heartburn.  Blood pressures were mildly elevated but otherwise vital signs stable.  He did not appear in any distress.  Respiratory system clear to auscultation.  Cardiovascular system S1 and S2 heard, RRR.  No JVD or murmurs.  Assessment and plan  Chest pain, rule out ACS: CTA chest negative for PE this admission.  EKG personally reviewed and no acute changes.  Troponin earlier this admission x3 were negative.  Placed back on telemetry.  Cycle troponin x3.  Aspirin.  PRN sublingual NTG.  N.p.o. after midnight.  Cardiology consultation in a.m.  Vernell Leep, MD, FACP, [facility]. [facility] Pager [phone]  If 7PM-7AM, please contact night-coverage [email or web address] Password [email or web address] 05/22/2017, 7:16 PM'

## 2017-05-22 NOTE — Progress Notes (Signed)
Pt. Co of SOB, VS assessed and stable. Breathing treatment given. Pt. Comfortable and no longer short of breath.

## 2017-05-22 NOTE — Significant Event (Signed)
Rapid Response Event Note  Overview: Time Called: 1711 Arrival Time: 1720 Event Type: Cardiac  Initial Focused Assessment: Called by RN for patient c/o chest pain. Upon entering room, patient noted to be audibly wheezing. Vital signs stable. Patient on oxygen with saturations >95%. EKG in progress. Patient alert and oriented. Normal heart sounds. Breath sounds: diminished with end-expiratory wheeze, R>L. Patient c/o chest tightness.  Interventions: EKG done, appears no change.  NTG SL given x 2. PRN Respiratory treatment given. MD arrived to bedside. Assessed patient.  Plan of Care (if not transferred): New order to resume cardiac monitoring. Optimize medications for bipolar and anxiety. Labs prn. Continue to follow patient and call for changes or worsening symptoms.  Event Summary: Name of Physician Notified: Dr Lavella Lemons at 1711  Outcome: Stayed in room and stabalized  Event End Time: [place]

## 2017-05-22 NOTE — Progress Notes (Signed)
PROGRESS NOTE   Taylor [name]  OVF:[id number]    DOB: 07-03-1955    DOA: 05/20/2017  PCP: Patient, No Pcp Per   I have briefly reviewed patients previous medical records in [facility]  Brief Narrative:  62 year old male with PMH of COPD, chronic respiratory failure with hypoxia on home oxygen 2.5-3 L/min continuously, ongoing tobacco abuse, bipolar disorder, prostate and colon cancer, hospitalized 4/7-4/11 for COPD exacerbation presented to ED 05/20/2017 with worsening dyspnea, weakness, dizziness, wheezing and cough with some sputum.  CT chest negative for PE or acute abnormalities.  Admitted for COPD exacerbation, acute on chronic hypoxic respiratory failure and elevated lactate.  Improving.   Assessment & Plan:   Principal Problem:   SIRS (systemic inflammatory response syndrome) (HCC) Active Problems:   Bipolar 1 disorder (HCC)   COPD with acute exacerbation (HCC)   COPD exacerbation: May have been precipitated by ongoing tobacco abuse, allergies.  RSV 4/23 was negative.  CTA chest 5/1: No PE or active pulmonary disease.  Emphysema noted.  Continue Xopenex nebulizations, Pulmicort, IV Solu-Medrol 40 mg every 12 hours.  Recently completed course of doxycycline.  Procalcitonin <0.1.  No clear infectious etiology and hence will discontinued empirically started Zosyn, vancomycin and levofloxacin.  Flutter valve added.  Tobacco cessation counseled.  Continues to improve.  There may be a component of anxiety to his dyspnea as well.  Acute on chronic hypoxic respiratory failure: Secondary to COPD exacerbation.  Management as above and wean to home oxygen as tolerated.  Patient was down to 3 L/min nasal cannula oxygen and saturating in the high 90s.  TTE 5/2: LVEF 65-70%.  Wall motion was normal and there were no regional wall motion abnormalities.  Grade 1 diastolic dysfunction.  RV: Moderate hypokinesis, particularly towards the apex?  Significance  Elevated lactate: Likely related  to acute respiratory failure.  Hydrated with IV fluids.  Resolved.  SIRS: Likely secondary to respiratory distress.  No obvious focus of sepsis.  EKG reviewed by admitting physician with cardiology and noted sinus tachycardia.  Resolved.  TSH normal.  Tobacco abuse: Cessation counseled.  Patient requests nicotine patch, added.  Bipolar disorder: Stable.  Continue Lexapro and Abilify.  Hyperlipidemia: Statins.  Uncontrolled DM2: A1c 04/29/2017: 6.5.  Now precipitated by steroids.  Added SSI.  Mildly uncontrolled and fluctuating.     DVT prophylaxis: Lovenox Code Status: DNI Family Communication: None at bedside Disposition: DC home pending clinical improvement, possibly 5/3.Marland Kitchen  Transferred from stepdown to telemetry.   Consultants:  None  Procedures:  None  Antimicrobials:  Discontinued vancomycin, levofloxacin and Zosyn 5/1.   Subjective: Continues to feel better.  Dyspnea improved but breathing not yet at baseline.  No chest pain or cough reported.  As per nursing, no acute issues noted this morning.  ROS: As above.  Objective:  Vitals:   05/22/17 0859 05/22/17 0900 05/22/17 0904 05/22/17 1254  BP:    (!) 164/85  Pulse: 70   78  Resp: 19   19  Temp:    98 F (36.7 C)  TempSrc:    Axillary  SpO2: 98% 98% 98% 98%  Weight:      Height:        Examination:  General exam: Pleasant middle-aged male, moderately built and nourished lying comfortably propped up in bed without distress. Respiratory system: Improved breath sounds.  Few scattered bilateral rhonchi but much less compared to yesterday.  Respiratory effort normal.  Able to speak in full sentences Cardiovascular system: S1 &  S2 heard, RRR. No JVD, murmurs, rubs, gallops or clicks. No pedal edema.  Telemetry personally reviewed: Sinus rhythm. Gastrointestinal system: Abdomen is nondistended, soft and nontender. No organomegaly or masses felt. Normal bowel sounds heard.  Stable without change. Central nervous  system: Alert and oriented. No focal neurological deficits.  Stable. Extremities: Symmetric 5 x 5 power. Skin: No rashes, lesions or ulcers Psychiatry: Judgement and insight appear normal. Mood & affect appropriate.     Data Reviewed: I have personally reviewed following labs and imaging studies  CBC: Recent Labs  Lab 05/20/17 2157 05/21/17 0305  WBC 10.2 9.1  HGB 14.0 12.4*  HCT 42.3 38.6*  MCV 94.2 96.5  PLT 203 160   Basic Metabolic Panel: Recent Labs  Lab 05/16/17 0420 05/20/17 2327 05/21/17 0305 05/22/17 0419  NA 138 138 136 136  K 4.3 4.6 4.2 4.6  CL 93* 97* 92* 97*  CO2 32 35* 30 34*  GLUCOSE 226* 159* 327* 218*  BUN 15 10 14 11   CREATININE 0.81 0.72 1.11 0.75  CALCIUM 8.6* 8.0* 8.2* 8.0*  MG  --   --  2.6*  --    Cardiac Enzymes: Recent Labs  Lab 05/21/17 0305 05/21/17 0757 05/21/17 1514  TROPONINI <0.03 <0.03 <0.03   HbA1C: No results for input(s): HGBA1C in the last 72 hours. CBG: Recent Labs  Lab 05/21/17 1155 05/21/17 1823 05/21/17 2207 05/22/17 0758 05/22/17 1211  GLUCAP 252* 233* 122* 163* 211*    Recent Results (from the past 240 hour(s))  Respiratory Panel by PCR     Status: None   Collection Time: 05/13/17  2:40 PM  Result Value Ref Range Status   Adenovirus NOT DETECTED NOT DETECTED Final   Coronavirus 229E NOT DETECTED NOT DETECTED Final   Coronavirus HKU1 NOT DETECTED NOT DETECTED Final   Coronavirus NL63 NOT DETECTED NOT DETECTED Final   Coronavirus OC43 NOT DETECTED NOT DETECTED Final   Metapneumovirus NOT DETECTED NOT DETECTED Final   Rhinovirus / Enterovirus NOT DETECTED NOT DETECTED Final   Influenza A NOT DETECTED NOT DETECTED Final   Influenza B NOT DETECTED NOT DETECTED Final   Parainfluenza Virus 1 NOT DETECTED NOT DETECTED Final   Parainfluenza Virus 2 NOT DETECTED NOT DETECTED Final   Parainfluenza Virus 3 NOT DETECTED NOT DETECTED Final   Parainfluenza Virus 4 NOT DETECTED NOT DETECTED Final   Respiratory  Syncytial Virus NOT DETECTED NOT DETECTED Final   Bordetella pertussis NOT DETECTED NOT DETECTED Final   Chlamydophila pneumoniae NOT DETECTED NOT DETECTED Final   Mycoplasma pneumoniae NOT DETECTED NOT DETECTED Final    Comment: Performed at [facility], [place] [address] [place], [place] [address]         Radiology Studies: Dg Chest 2 View  Result Date: 05/20/2017 CLINICAL DATA:  Shortness of breath EXAM: CHEST - 2 VIEW COMPARISON:  05/13/2017 FINDINGS: Lungs are clear.  No pleural effusion or pneumothorax. The heart is normal in size. Mild degenerative changes of the visualized thoracolumbar spine. IMPRESSION: Normal chest radiographs. Electronically Signed   By: Julian Hy M.D.   On: 05/20/2017 22:44   Ct Head Wo Contrast  Result Date: 05/21/2017 CLINICAL DATA:  Headache and generalize weakness for 2 days. EXAM: CT HEAD WITHOUT CONTRAST TECHNIQUE: Contiguous axial images were obtained from the base of the skull through the vertex without intravenous contrast. COMPARISON:  12/04/2016 FINDINGS: Brain: No evidence of acute infarction, hemorrhage, hydrocephalus, extra-axial collection or mass lesion/mass effect. Vascular: No hyperdense vessel  or unexpected calcification. Skull: Normal. Negative for fracture or focal lesion. Sinuses/Orbits: No acute finding. Other: None. IMPRESSION: No acute intracranial abnormalities. Electronically Signed   By: Lucienne Capers M.D.   On: 05/21/2017 03:03   Ct Angio Chest Pe W Or Wo Contrast  Result Date: 05/21/2017 CLINICAL DATA:  Sternal pain for 2 days.  History of COPD. EXAM: CT ANGIOGRAPHY CHEST WITH CONTRAST TECHNIQUE: Multidetector CT imaging of the chest was performed using the standard protocol during bolus administration of intravenous contrast. Multiplanar CT image reconstructions and MIPs were obtained to evaluate the vascular anatomy. CONTRAST:  156mL ISOVUE-370 IOPAMIDOL (ISOVUE-370) INJECTION 76% COMPARISON:  None. FINDINGS:  Cardiovascular: Good opacification of the central and segmental pulmonary arteries. No focal filling defects. No evidence of significant pulmonary embolus. Normal heart size. No pericardial effusion. Normal caliber thoracic aorta. No aortic dissection. Great vessel origins are patent. Coronary artery calcifications. Mediastinum/Nodes: No enlarged mediastinal, hilar, or axillary lymph nodes. Thyroid gland, trachea, and esophagus demonstrate no significant findings. Lungs/Pleura: Emphysematous changes in the lungs. Bronchial wall thickening consistent with chronic bronchitis. No airspace disease or consolidation. No pleural effusions. No pneumothorax. Airways are patent. Upper Abdomen: No acute process demonstrated in the visualized upper abdomen. Musculoskeletal: Old right third rib deformity. Degenerative changes in the spine. No destructive bone lesions. Review of the MIP images confirms the above findings. IMPRESSION: 1. No evidence of significant pulmonary embolus. 2. Emphysematous changes in the lungs. No evidence of active pulmonary disease. 3. Mild coronary artery calcifications. Emphysema (ICD10-J43.9). Electronically Signed   By: Lucienne Capers M.D.   On: 05/21/2017 03:08        Scheduled Meds: . ARIPiprazole  5 mg Oral Daily  . budesonide (PULMICORT) nebulizer solution  0.25 mg Nebulization BID  . enoxaparin (LOVENOX) injection  40 mg Subcutaneous Q24H  . escitalopram  20 mg Oral Daily  . insulin aspart  0-5 Units Subcutaneous QHS  . insulin aspart  0-9 Units Subcutaneous TID WC  . levalbuterol  0.63 mg Nebulization Q6H  . mouth rinse  15 mL Mouth Rinse BID  . methylPREDNISolone (SOLU-MEDROL) injection  40 mg Intravenous Q12H  . nicotine  7 mg Transdermal Daily  . pravastatin  20 mg Oral q1800   Continuous Infusions:    LOS: 1 day     Vernell Leep, MD, FACP, [facility]. [facility] Pager [phone]  If 7PM-7AM, please contact night-coverage [email or web address] Password  [facility] 05/22/2017, 2:36 PM

## 2017-05-22 NOTE — Progress Notes (Signed)
Patient reported shortness of breath and chest pressure 8/10.    05/22/17 1707  Vitals  BP (!) 188/92  MAP (mmHg) 120  BP Location Right Arm  BP Method Automatic  Patient Position (if appropriate) Lying  Pulse Rate 82  Oxygen Therapy  SpO2 98 %  O2 Device Nasal Cannula  O2 Flow Rate (L/min) 4 L/min  Provider and rapid response notified, EKG obtained, provider paged to make aware EKG complete.

## 2017-05-23 ENCOUNTER — Encounter (HOSPITAL_COMMUNITY): Payer: Self-pay | Admitting: Cardiology

## 2017-05-23 DIAGNOSIS — R0789 Other chest pain: Secondary | ICD-10-CM

## 2017-05-23 DIAGNOSIS — R079 Chest pain, unspecified: Secondary | ICD-10-CM

## 2017-05-23 DIAGNOSIS — E785 Hyperlipidemia, unspecified: Secondary | ICD-10-CM

## 2017-05-23 LAB — TROPONIN I
Troponin I: <0.03 ng/mL (ref <0.03)
Troponin I: <0.03 ng/mL (ref <0.03)

## 2017-05-23 LAB — GLUCOSE, CAPILLARY
Glucose-Capillary: 156 mg/dL — ABNORMAL HIGH (ref 65–99)
Glucose-Capillary: 120 mg/dL — ABNORMAL HIGH (ref 65–99)
Glucose-Capillary: 271 mg/dL — ABNORMAL HIGH (ref 65–99)
Glucose-Capillary: 154 mg/dL — ABNORMAL HIGH (ref 65–99)

## 2017-05-23 LAB — BRAIN NATRIURETIC PEPTIDE: B Natriuretic Peptide: 93.4 pg/mL (ref 0.0–100.0)

## 2017-05-23 MED ORDER — PRAVASTATIN SODIUM 40 MG PO TABS
40.0000 mg | ORAL_TABLET | Freq: Every day | ORAL | Status: DC
  Administered 2017-05-23 – 2017-05-26 (×4): 40 mg via ORAL
  Filled 2017-05-23 (×4): qty 1

## 2017-05-23 MED ORDER — PREDNISONE 20 MG PO TABS
40.0000 mg | ORAL_TABLET | Freq: Every day | ORAL | Status: DC
  Administered 2017-05-24 (×2): 40 mg via ORAL
  Filled 2017-05-23 (×2): qty 2

## 2017-05-23 MED ORDER — HYDROCHLOROTHIAZIDE 10 MG/ML ORAL SUSPENSION
6.2500 mg | Freq: Every day | ORAL | Status: DC
  Administered 2017-05-23 – 2017-05-26 (×4): 6.25 mg via ORAL
  Filled 2017-05-23 (×5): qty 1.25

## 2017-05-23 MED ORDER — PANTOPRAZOLE SODIUM 40 MG PO TBEC
40.0000 mg | DELAYED_RELEASE_TABLET | Freq: Every day | ORAL | Status: DC
  Administered 2017-05-23 – 2017-05-26 (×4): 40 mg via ORAL
  Filled 2017-05-23 (×4): qty 1

## 2017-05-23 MED ORDER — GI COCKTAIL ~~LOC~~
30.0000 mL | Freq: Three times a day (TID) | ORAL | Status: DC | PRN
  Administered 2017-05-23 – 2017-05-25 (×2): 30 mL via ORAL
  Filled 2017-05-23 (×2): qty 30

## 2017-05-23 NOTE — Consult Note (Addendum)
Cardiology Consultation:   Patient ID: Taylor [name]; [id number]; 06-01-1955   Admit date: 05/20/2017 Date of Consult: 05/23/2017  Primary Care Provider: Patient, No Pcp Per Primary Cardiologist: New to [place] Primary Electrophysiologist:  None   Patient Profile:   Taylor [name] is a 62 y.o. male with PMH of COPD, bipolar disorder, anxiety, HLD, pre-DM, colon and prostate CA s/p treatment, and ongoing tobacco abuse who is being seen today for the evaluation of Chest pain at the request of Dr. Algis Liming.  History of Present Illness:   Taylor [name] has multiple admissions to the hospital for COPD exacerbation this year, who presented 05/20/17 with complaints of SOB, weakness, wheezing, and productive cough which had been persistent from the time of his last discharge. He was admitted to the hospital 05/21/17 for management of a COPD exacerbation and acute on chronic respiratory failure.   On the evening of 05/22/17, he reports chest pressure with associated diaphoresis which improved after receiving SL nitro. Patient is a poor historian and has difficulty describing the pain. He states the pain felt like heartburn. He initially denied CP since that time, however endorsed receiving another 2 SL nitro this morning. He reports that he has had intermittent CP for quite some time but has never endorsed his symptoms during any of his numerous admissions for COPD exacerbations. He denies prior ischemic work up with either stress testing or LHC. He reports his father had CHF and perhaps an MI; no other family history of CAD/CHF. He still smokes 1-2 cigarettes a day but denies ETOH or illicit drug use. He denies orthopnea, PND, LE edema, dizziness, lightheadedness, or syncope.   Hospital course: Hypertensive, otherwise VSS - on 2-3L O2 via [place] at baseline. Labs notable for electrolytes wnl, Cr 0.75, Trop <0.03 x3. BNP 93.4, Hgb 12.4, PLT 152, Procal <0.10, TSH wnl. EKG with NSR, TWI aVL and  V2 (old), no STE/D. CXR negative for acute findings. CTA Chest negative for PE but showed mild coronary artery calcifications. Echo with EF 65-70%, G1DD, hypokinesis on the RV, and dilated IVC. Cardiology consulted for further evaluation of CP.     Past Medical History:  Diagnosis Date  . Bipolar 1 disorder ([place]) 02/05/2012  . Cancer ([place]) 04/11/11   adenocarcinoma of colon, 7/19 nodes pos.FINISHED CHEMO/DR. SHERRILL  . COPD (chronic obstructive pulmonary disease) ([place])    SMOKER  . Full dentures   . Hemorrhoids   . Inguinal hernia    RIGHT- PAINFUL  . Prostate cancer ([place])    Gleason score = 7, supposed to have radiation therapy but he has not followed up  . Rib fractures    hx of  . Shortness of breath     Past Surgical History:  Procedure Laterality Date  . COLON SURGERY  04/11/11   Sigmoid colectomy  . COLOSTOMY REVISION  04/11/2011   Procedure: COLON RESECTION SIGMOID;  Surgeon: [name], MD;  Location: [facility];  Service: General;  Laterality: N/A;  low anterior colon resection   . INGUINAL HERNIA REPAIR Right 05/01/2012   Procedure: HERNIA REPAIR INGUINAL ADULT;  Surgeon: [name], MD;  Location: [facility];  Service: General;  Laterality: Right;  . INSERTION OF MESH Right 05/01/2012   Procedure: INSERTION OF MESH;  Surgeon: [name], MD;  Location: [facility];  Service: General;  Laterality: Right;  . PORT-A-CATH REMOVAL Left 05/01/2012   Procedure: REMOVAL Infusion Port;  Surgeon: [name], MD;  Location: [facility];  Service: General;  Laterality: Left;  . PORTACATH PLACEMENT    . PORTACATH PLACEMENT  05/02/2011   Procedure: INSERTION PORT-A-CATH;  Surgeon: [name], MD;  Location: [facility];  Service: General;  Laterality: N/A;     Home Medications:  Prior to Admission medications   Medication Sig Start Date End Date Taking? Authorizing Provider  albuterol (PROVENTIL) (2.5 MG/3ML) 0.083% nebulizer solution Take 3 mLs (2.5 mg total) by nebulization every 6 (six)  hours as needed for wheezing or shortness of breath. Dx: J44.9 05/18/17 06/17/17 Yes Nita Sells, MD  ARIPiprazole (ABILIFY) 5 MG tablet Take 1 tablet (5 mg total) by mouth every morning. 05/18/17  Yes Nita Sells, MD  escitalopram (LEXAPRO) 20 MG tablet Take 1 tablet (20 mg total) by mouth every morning. 05/18/17  Yes Nita Sells, MD  fluticasone (FLOVENT HFA) 110 MCG/ACT inhaler Inhale 2 puffs into the lungs 2 (two) times daily. 05/18/17 07/17/17 Yes Nita Sells, MD  lovastatin (MEVACOR) 20 MG tablet Take 1 tablet (20 mg total) by mouth at bedtime. 05/18/17  Yes Nita Sells, MD  mometasone-formoterol (DULERA) 100-5 MCG/ACT AERO Inhale 2 puffs into the lungs 2 (two) times daily. 05/18/17  Yes Nita Sells, MD  OXYGEN Inhale 3 L into the lungs.    Yes [provider]  predniSONE (DELTASONE) 20 MG tablet Take 2 tablets (40 mg total) by mouth daily before breakfast. 05/19/17  Yes Nita Sells, MD  tiotropium (SPIRIVA) 18 MCG inhalation capsule Place 1 capsule (18 mcg total) into inhaler and inhale daily. 05/18/17 06/17/17 Yes Nita Sells, MD    Inpatient Medications: Scheduled Meds: . ARIPiprazole  5 mg Oral Daily  . aspirin  324 mg Oral Daily  . budesonide (PULMICORT) nebulizer solution  0.25 mg Nebulization BID  . enoxaparin (LOVENOX) injection  40 mg Subcutaneous Q24H  . escitalopram  20 mg Oral Daily  . insulin aspart  0-5 Units Subcutaneous QHS  . insulin aspart  0-9 Units Subcutaneous TID WC  . levalbuterol  0.63 mg Nebulization Q6H  . mouth rinse  15 mL Mouth Rinse BID  . methylPREDNISolone (SOLU-MEDROL) injection  40 mg Intravenous Q12H  . nicotine  7 mg Transdermal Daily  . pravastatin  20 mg Oral q1800   Continuous Infusions:  PRN Meds: acetaminophen **OR** acetaminophen, gi cocktail, levalbuterol, nitroGLYCERIN, ondansetron **OR** ondansetron (ZOFRAN) IV  Allergies:    Allergies  Allergen Reactions  .  Codeine Nausea And Vomiting    Social History:   Social History   Socioeconomic History  . Marital status: Divorced    Spouse name: Not on file  . Number of children: Not on file  . Years of education: Not on file  . Highest education level: Not on file  Occupational History  . Occupation: disability pending  Social Needs  . Financial resource strain: Not on file  . Food insecurity:    Worry: Not on file    Inability: Not on file  . Transportation needs:    Medical: Not on file    Non-medical: Not on file  Tobacco Use  . Smoking status: Former Smoker    Packs/day: 1.00    Years: 45.00    Pack years: 45.00    Types: Cigarettes    Last attempt to quit: 01/22/2016    Years since quitting: 1.3  . Smokeless tobacco: Former Network engineer and Sexual Activity  . Alcohol use: No    Comment: h/o use in the past, no h/o heavy use  .  Drug use: No  . Sexual activity: Never  Lifestyle  . Physical activity:    Days per week: Not on file    Minutes per session: Not on file  . Stress: Not on file  Relationships  . Social connections:    Talks on phone: Not on file    Gets together: Not on file    Attends religious service: Not on file    Active member of club or organization: Not on file    Attends meetings of clubs or organizations: Not on file    Relationship status: Not on file  . Intimate partner violence:    Fear of current or ex partner: Not on file    Emotionally abused: Not on file    Physically abused: Not on file    Forced sexual activity: Not on file  Other Topics Concern  . Not on file  Social History Narrative   Lives alone-divorced, disabled, in [place] [address]'s   Brother, Kadar Chance and his wife Jackelyn Poling assist   Prior occupation: Clinical biochemist    Family History:    Family History  Problem Relation Age of Onset  . Heart disease Father   . Lung cancer Maternal Uncle        smoked     ROS:  Please see the history of present illness.   All other ROS  reviewed and negative.     Physical Exam/Data:   Vitals:   05/23/17 0734 05/23/17 0743 05/23/17 0744 05/23/17 0749  BP: (!) 143/93     Pulse: 83 79    Resp:  18    Temp:      TempSrc:      SpO2:  96% 96% 96%  Weight:      Height:        Intake/Output Summary (Last 24 hours) at 05/23/2017 1008 Last data filed at 05/23/2017 0710 Gross per 24 hour  Intake 360 ml  Output 4505 ml  Net -4145 ml   Filed Weights   05/20/17 2152 05/21/17 0431  Weight: 165 lb (74.8 kg) 167 lb 12.3 oz (76.1 kg)   Body mass index is 23.4 kg/m.  General:  Well nourished, well developed, laying in bed in no acute distress HEENT: sclera anicteric  Neck: no JVD Vascular: No carotid bruits; distal pulses 2+ bilaterally Cardiac:  normal S1, S2; RRR; no murmurs, gallops, or rubs Lungs:  Poor air movement. Decreased breath sounds throughout with scattered wheezing/rhonchi Abd: NABS, obese, soft, nontender, no hepatomegaly Ext: no edema Musculoskeletal:  No deformities, BUE and BLE strength normal and equal Skin: warm and dry  Neuro:  CNs 2-12 intact, no focal abnormalities noted Psych:  Normal affect   EKG:  The EKG was personally reviewed and demonstrates:  EKG with NSR, TWI aVL and V2 (old), no STE/D Telemetry:  Telemetry was personally reviewed and demonstrates:  NSR with occasional PACs  Relevant CV Studies:  Echocardiogram 05/22/17: Study Conclusions  - Procedure narrative: Transthoracic echocardiography. Image   quality was poor. The study was technically difficult, as a   result of poor acoustic windows. Intravenous contrast (Definity)   was administered. - Left ventricle: The cavity size was normal. Wall thickness was   normal. Systolic function was vigorous. The estimated ejection   fraction was in the range of 65% to 70%. Wall motion was normal;   there were no regional wall motion abnormalities. Doppler   parameters are consistent with abnormal left ventricular   relaxation (grade 1  diastolic dysfunction).  The E/e&' ratio is   between 8-15, suggesting indeterminate LV filling pressure. - Mitral valve: Mildly thickened leaflets . There was trivial   regurgitation. - Left atrium: The atrium was normal in size. - Right ventricle: The cavity size was normal. Wall thickness was   normal. Moderate hypokinesis, particularly toward the apex. - Inferior vena cava: The vessel was dilated. The respirophasic   diameter changes were blunted (< 50%), consistent with elevated   central venous pressure.  Impressions:  - Compared to a prior study in 04/29/2017, the LVEF is higher at   65-70%. There is moderate apical RV systolic dysfunction. RVSP   could not be estimated in this study, but the IVC is dilated.  Laboratory Data:  Chemistry Recent Labs  Lab 05/20/17 2327 05/21/17 0305 05/22/17 0419  NA 138 136 136  K 4.6 4.2 4.6  CL 97* 92* 97*  CO2 35* 30 34*  GLUCOSE 159* 327* 218*  BUN 10 14 11   CREATININE 0.72 1.11 0.75  CALCIUM 8.0* 8.2* 8.0*  GFRNONAA >60 >60 >60  GFRAA >60 >60 >60  ANIONGAP 6 14 5     No results for input(s): PROT, ALBUMIN, AST, ALT, ALKPHOS, BILITOT in the last 168 hours. Hematology Recent Labs  Lab 05/20/17 2157 05/21/17 0305  WBC 10.2 9.1  RBC 4.49 4.00*  HGB 14.0 12.4*  HCT 42.3 38.6*  MCV 94.2 96.5  MCH 31.2 31.0  MCHC 33.1 32.1  RDW 13.7 13.4  PLT 203 152   Cardiac Enzymes Recent Labs  Lab 05/21/17 0305 05/21/17 0757 05/21/17 1514 05/22/17 1807 05/23/17 0116 05/23/17 0711  TROPONINI <0.03 <0.03 <0.03 <0.03 <0.03 <0.03    Recent Labs  Lab 05/20/17 2217  TROPIPOC 0.00    BNPNo results for input(s): BNP, PROBNP in the last 168 hours.  DDimer No results for input(s): DDIMER in the last 168 hours.  Radiology/Studies:  Dg Chest 2 View  Result Date: 05/20/2017 CLINICAL DATA:  Shortness of breath EXAM: CHEST - 2 VIEW COMPARISON:  05/13/2017 FINDINGS: Lungs are clear.  No pleural effusion or pneumothorax. The heart is  normal in size. Mild degenerative changes of the visualized thoracolumbar spine. IMPRESSION: Normal chest radiographs. Electronically Signed   By: Julian Hy M.D.   On: 05/20/2017 22:44   Ct Head Wo Contrast  Result Date: 05/21/2017 CLINICAL DATA:  Headache and generalize weakness for 2 days. EXAM: CT HEAD WITHOUT CONTRAST TECHNIQUE: Contiguous axial images were obtained from the base of the skull through the vertex without intravenous contrast. COMPARISON:  12/04/2016 FINDINGS: Brain: No evidence of acute infarction, hemorrhage, hydrocephalus, extra-axial collection or mass lesion/mass effect. Vascular: No hyperdense vessel or unexpected calcification. Skull: Normal. Negative for fracture or focal lesion. Sinuses/Orbits: No acute finding. Other: None. IMPRESSION: No acute intracranial abnormalities. Electronically Signed   By: Lucienne Capers M.D.   On: 05/21/2017 03:03   Ct Angio Chest Pe W Or Wo Contrast  Result Date: 05/21/2017 CLINICAL DATA:  Sternal pain for 2 days.  History of COPD. EXAM: CT ANGIOGRAPHY CHEST WITH CONTRAST TECHNIQUE: Multidetector CT imaging of the chest was performed using the standard protocol during bolus administration of intravenous contrast. Multiplanar CT image reconstructions and MIPs were obtained to evaluate the vascular anatomy. CONTRAST:  134mL ISOVUE-370 IOPAMIDOL (ISOVUE-370) INJECTION 76% COMPARISON:  None. FINDINGS: Cardiovascular: Good opacification of the central and segmental pulmonary arteries. No focal filling defects. No evidence of significant pulmonary embolus. Normal heart size. No pericardial effusion. Normal caliber thoracic aorta. No aortic dissection. [employer]  vessel origins are patent. Coronary artery calcifications. Mediastinum/Nodes: No enlarged mediastinal, hilar, or axillary lymph nodes. Thyroid gland, trachea, and esophagus demonstrate no significant findings. Lungs/Pleura: Emphysematous changes in the lungs. Bronchial wall thickening consistent  with chronic bronchitis. No airspace disease or consolidation. No pleural effusions. No pneumothorax. Airways are patent. Upper Abdomen: No acute process demonstrated in the visualized upper abdomen. Musculoskeletal: Old right third rib deformity. Degenerative changes in the spine. No destructive bone lesions. Review of the MIP images confirms the above findings. IMPRESSION: 1. No evidence of significant pulmonary embolus. 2. Emphysematous changes in the lungs. No evidence of active pulmonary disease. 3. Mild coronary artery calcifications. Emphysema (ICD10-J43.9). Electronically Signed   By: Lucienne Capers M.D.   On: 05/21/2017 03:08    Assessment and Plan:   1. Chest pain: sudden onset mid chest pressure starting yesterday evening which was non radiating but associated with mild diaphoresis. No other associated symptoms. Improved with SL nitro. Trop negative x3. BNP 93. EKG with baseline wander but no obvious STE/D, TWI aVL and V2 (old). Echo yesterday prior to CP episode with EF 65-70%, G1DD, hypokinesis on the RV, and dilated IVC. Risk factors for ACS include HLD, pre-DM, likely undiagnosed HTN, and family history of CAD.   2. COPD: p/w SOB, weakness, wheezing, and coughing. Admitted for COPD exacerbation. Has had numerous admissions for the same this year. Off antibiotics. Improving per primary team. - Continue management per primary team  3. Elevated BP: Observed on previous admissions as well. Likely HTN - Would benefit from better BP control. Will start HCTZ 6.25mg  daily which can be titrated as needed.  4. HLD: LDL 100 earlier this month.  - Will increase pravastatin to 40mg  daily   For questions or updates, please contact [place] Please consult [email or web address] for contact info under Cardiology/STEMI.   Signed, Abigail Butts, PA-C  05/23/2017 10:08 AM [phone]  History and all data above reviewed.  Patient examined.  I agree with the findings as above.   The patient is  a pleasant gentleman with severe lung disease that limits his function capacity.  He says that he lives in a home with four other folks and he only gets around in the home with his O2.  He is SOB with much activity but this has been baseline.  He denies PND or orthopnea.  He does not report routine chest pain.  He says he really did not have pain yesterday but thought that this was more SOB.  I think that he is having difficulty with recall and I did review the description of the pain written last night.  The patient really does not endorse this but admits to some discomfort perhaps but without radiation or associated symptoms.  He says he had reflux with significant gas.  He says "I have have had gas in my lungs."   The patient exam reveals COR:RRR  ,  Lungs: Decreased breath sounds throughout  ,  Abd: Positive bowel sounds, no rebound no guarding, Ext No edeam  .  All available labs, radiology testing, previous records reviewed. Agree with documented assessment and plan. Chest pain:  No objective evidence of ischemia.  The pain is very difficult for him to quantify or qualify.  He thought it felt like gas or reflux.  He does have coronary calcium.  However, without recurrent symptoms or objective findings I would not suggest further testing.  Certainly we would need to revisit this if he has any recurrent pain.  The patient would like to defer any further testing at this point.  Please call with further questions.    Jeneen Rinks Joell Usman  2:23 PM  05/23/2017

## 2017-05-23 NOTE — Progress Notes (Signed)
Pt complaining of chest pain stating it felt like pressure on the left side of his chest rating pain a 8/10, BP 175/93 1 nitro given, and Dr Algis Liming notified EKG ordered and placed in chart. After 1st nitro pain was decreased to 5/10 BP 143/93. Pt given 2nd nitro per MD after 5 min pt rating pain 0/10 Dr. Algis Liming aware will continue to monitor

## 2017-05-23 NOTE — Progress Notes (Signed)
PROGRESS NOTE   Taylor [name]  ELF:[id number]    DOB: 1955-08-04    DOA: 05/20/2017  PCP: Patient, No Pcp Per   I have briefly reviewed patients previous medical records in [facility].  Brief Narrative:  62 year old male with PMH of COPD, chronic respiratory failure with hypoxia on home oxygen 2.5-3 L/min continuously, ongoing tobacco abuse, bipolar disorder, prostate and colon cancer, hospitalized 4/7-4/11 for COPD exacerbation presented to ED 05/20/2017 with worsening dyspnea, weakness, dizziness, wheezing and cough with some sputum.  CT chest negative for PE or acute abnormalities.  Admitted for COPD exacerbation, acute on chronic hypoxic respiratory failure and elevated lactate.  Improving.  Developed chest pain 5/2 evening and morning of 5/3.  Cardiology consulted, ACS ruled out, no further work-up recommended.   Assessment & Plan:   Principal Problem:   SIRS (systemic inflammatory response syndrome) (HCC) Active Problems:   Bipolar 1 disorder (HCC)   COPD with acute exacerbation (HCC)   COPD exacerbation: May have been precipitated by ongoing tobacco abuse, allergies.  RSV 4/23 was negative.  CTA chest 5/1: No PE or active pulmonary disease.  Emphysema noted.  Continue Xopenex nebulizations, Pulmicort, IV Solu-Medrol 40 mg every 12 hours.  Recently completed course of doxycycline.  Procalcitonin <0.1.  No clear infectious etiology and hence will discontinued empirically started Zosyn, vancomycin and levofloxacin.  Flutter valve added.  Tobacco cessation counseled.  Continues to improve.  There may be a component of anxiety to his dyspnea as well.  Likely transition to oral prednisone taper at discharge 5/4.  Atypical chest pain: Serial EKGs without acute changes, troponin x5 neg.  CTA chest negative for PE.  He developed chest pain 5/2 evening and then again 5/3 a.m.  Cardiology input appreciated and suspect not really chest pain but more from dyspnea or GI symptoms and do not  recommend further work-up unless has recurring chest pain and patient also does not wish further evaluation.  Currently resolved.  Added PPI.  Acute on chronic hypoxic respiratory failure: Secondary to COPD exacerbation.  Management as above and wean to home oxygen as tolerated.  Patient was down to 3 L/min nasal cannula oxygen and saturating in the high 90s.  TTE 5/2: LVEF 65-70%.  Wall motion was normal and there were no regional wall motion abnormalities.  Grade 1 diastolic dysfunction.  RV: Moderate hypokinesis, particularly towards the apex?  Significance.  Now down to 3 L/min nasal cannula oxygen.  Elevated lactate: Likely related to acute respiratory failure.  Hydrated with IV fluids.  Resolved.  SIRS: Likely secondary to respiratory distress.  No obvious focus of sepsis.  EKG reviewed by admitting physician with cardiology and noted sinus tachycardia.  Resolved.  TSH normal.  Tobacco abuse: Cessation counseled.  Patient requests nicotine patch, added.  Bipolar disorder: Stable.  Continue Lexapro and Abilify.  Hyperlipidemia: Statins.  Uncontrolled DM2: A1c 04/29/2017: 6.5.  Now precipitated by steroids.  Added SSI.  Mildly uncontrolled and fluctuating.  Should get better after taper of steroids.     DVT prophylaxis: Lovenox Code Status: DNI Family Communication: None at bedside Disposition: DC home pending clinical improvement, possibly 5/4.  Consultants:  None  Procedures:  None  Antimicrobials:  Discontinued vancomycin, levofloxacin and Zosyn 5/1.   Subjective: Again had precordial chest discomfort approximately 715 this morning, paged by nurse.  Resolved after 2 doses of sublingual NTG.  When I interviewed and examined him, no further chest pains.  Dyspnea improved and breathing back to baseline.  No other complaints reported.  ROS: As above.  Objective:  Vitals:   05/23/17 0744 05/23/17 0749 05/23/17 1329 05/23/17 1410  BP:   (!) 150/84   Pulse:   71 80  Resp:    15 20  Temp:   98.5 F (36.9 C)   TempSrc:   Oral   SpO2: 96% 96% 97% 96%  Weight:      Height:        Examination:  General exam: Pleasant middle-aged male, moderately built and nourished lying comfortably propped up in bed without distress.  Does not appear in any distress. Respiratory system: Slightly distant sounding breath sounds.  Occasional bilateral rhonchi but much improved compared to 48 hours ago.  No increased work of breathing. Cardiovascular system: S1 & S2 heard, RRR. No JVD, murmurs, rubs, gallops or clicks. No pedal edema.  Telemetry personally reviewed: Sinus rhythm.  No reproducible chest wall tenderness. Gastrointestinal system: Abdomen is nondistended, soft and nontender. No organomegaly or masses felt. Normal bowel sounds heard.  Stable. Central nervous system: Alert and oriented. No focal neurological deficits.  Stable. Extremities: Symmetric 5 x 5 power. Skin: No rashes, lesions or ulcers Psychiatry: Judgement and insight appear normal. Mood & affect appropriate.     Data Reviewed: I have personally reviewed following labs and imaging studies  CBC: Recent Labs  Lab 05/20/17 2157 05/21/17 0305  WBC 10.2 9.1  HGB 14.0 12.4*  HCT 42.3 38.6*  MCV 94.2 96.5  PLT 203 557   Basic Metabolic Panel: Recent Labs  Lab 05/20/17 2327 05/21/17 0305 05/22/17 0419  NA 138 136 136  K 4.6 4.2 4.6  CL 97* 92* 97*  CO2 35* 30 34*  GLUCOSE 159* 327* 218*  BUN 10 14 11   CREATININE 0.72 1.11 0.75  CALCIUM 8.0* 8.2* 8.0*  MG  --  2.6*  --    Cardiac Enzymes: Recent Labs  Lab 05/21/17 0757 05/21/17 1514 05/22/17 1807 05/23/17 0116 05/23/17 0711  TROPONINI <0.03 <0.03 <0.03 <0.03 <0.03   HbA1C: No results for input(s): HGBA1C in the last 72 hours. CBG: Recent Labs  Lab 05/22/17 1726 05/22/17 2207 05/23/17 0750 05/23/17 1227 05/23/17 1647  GLUCAP 177* 232* 156* 120* 271*    Recent Results (from the past 240 hour(s))  Culture, blood (routine x 2)      Status: None (Preliminary result)   Collection Time: 05/21/17  3:05 AM  Result Value Ref Range Status   Specimen Description BLOOD RIGHT ANTECUBITAL  Final   Special Requests   Final    BOTTLES DRAWN AEROBIC AND ANAEROBIC Blood Culture adequate volume   Culture   Final    NO GROWTH 2 DAYS Performed at [facility], [place] [address], [place], [place] [address]    Report Status PENDING  Incomplete  Culture, blood (routine x 2)     Status: None (Preliminary result)   Collection Time: 05/21/17  3:14 AM  Result Value Ref Range Status   Specimen Description BLOOD RIGHT FOREARM  Final   Special Requests   Final    BOTTLES DRAWN AEROBIC AND ANAEROBIC Blood Culture adequate volume   Culture   Final    NO GROWTH 2 DAYS Performed at [facility], [place] [address], [place],  [address]    Report Status PENDING  Incomplete         Radiology Studies: No results found.      Scheduled Meds: . ARIPiprazole  5 mg Oral Daily  .  aspirin  324 mg Oral Daily  . budesonide (PULMICORT) nebulizer solution  0.25 mg Nebulization BID  . enoxaparin (LOVENOX) injection  40 mg Subcutaneous Q24H  . escitalopram  20 mg Oral Daily  . hydrochlorothiazide  6.25 mg Oral Daily  . insulin aspart  0-5 Units Subcutaneous QHS  . insulin aspart  0-9 Units Subcutaneous TID WC  . levalbuterol  0.63 mg Nebulization Q6H  . mouth rinse  15 mL Mouth Rinse BID  . methylPREDNISolone (SOLU-MEDROL) injection  40 mg Intravenous Q12H  . nicotine  7 mg Transdermal Daily  . pravastatin  40 mg Oral q1800   Continuous Infusions:    LOS: 2 days     Vernell Leep, MD, FACP, [facility]. [facility] Pager [phone]  If 7PM-7AM, please contact night-coverage [email or web address] Password [facility] 05/23/2017, 5:23 PM

## 2017-05-24 ENCOUNTER — Inpatient Hospital Stay (HOSPITAL_COMMUNITY): Payer: Medicaid Other

## 2017-05-24 DIAGNOSIS — J441 Chronic obstructive pulmonary disease with (acute) exacerbation: Principal | ICD-10-CM

## 2017-05-24 DIAGNOSIS — J432 Centrilobular emphysema: Secondary | ICD-10-CM

## 2017-05-24 DIAGNOSIS — R6510 Systemic inflammatory response syndrome (SIRS) of non-infectious origin without acute organ dysfunction: Secondary | ICD-10-CM

## 2017-05-24 LAB — GLUCOSE, CAPILLARY
Glucose-Capillary: 95 mg/dL (ref 65–99)
Glucose-Capillary: 219 mg/dL — ABNORMAL HIGH (ref 65–99)
Glucose-Capillary: 231 mg/dL — ABNORMAL HIGH (ref 65–99)
Glucose-Capillary: 356 mg/dL — ABNORMAL HIGH (ref 65–99)

## 2017-05-24 MED ORDER — MORPHINE SULFATE (PF) 2 MG/ML IV SOLN
2.0000 mg | INTRAVENOUS | Status: DC | PRN
  Administered 2017-05-24 – 2017-05-25 (×3): 2 mg via INTRAVENOUS
  Filled 2017-05-24 (×3): qty 1

## 2017-05-24 MED ORDER — METHYLPREDNISOLONE SODIUM SUCC 40 MG IJ SOLR
40.0000 mg | Freq: Two times a day (BID) | INTRAMUSCULAR | Status: DC
  Administered 2017-05-24 – 2017-05-26 (×4): 40 mg via INTRAVENOUS
  Filled 2017-05-24 (×4): qty 1

## 2017-05-24 MED ORDER — IPRATROPIUM BROMIDE 0.02 % IN SOLN
0.5000 mg | Freq: Four times a day (QID) | RESPIRATORY_TRACT | Status: DC
  Administered 2017-05-24 – 2017-05-26 (×8): 0.5 mg via RESPIRATORY_TRACT
  Filled 2017-05-24 (×8): qty 2.5

## 2017-05-24 MED ORDER — GUAIFENESIN-DM 100-10 MG/5ML PO SYRP
5.0000 mL | ORAL_SOLUTION | ORAL | Status: DC | PRN
  Administered 2017-05-24: 5 mL via ORAL
  Filled 2017-05-24: qty 5

## 2017-05-24 MED ORDER — IPRATROPIUM BROMIDE 0.02 % IN SOLN: 0.5000 mg | Freq: Four times a day (QID) | RESPIRATORY_TRACT | Status: DC | PRN

## 2017-05-24 MED ORDER — BUDESONIDE 0.5 MG/2ML IN SUSP
0.5000 mg | Freq: Two times a day (BID) | RESPIRATORY_TRACT | Status: DC
  Administered 2017-05-24 – 2017-05-26 (×5): 0.5 mg via RESPIRATORY_TRACT
  Filled 2017-05-24 (×6): qty 2

## 2017-05-24 NOTE — Consult Note (Addendum)
Name: Taylor [name] MRN: [id number] DOB: 11/08/1955    ADMISSION DATE:  05/20/2017 CONSULTATION DATE:  05/24/17  REFERRING MD :  Dr. Algis Liming / [facility]  CHIEF COMPLAINT:  Shortness of Breath    HISTORY OF PRESENT ILLNESS:  62 y/o M with 2L O2 dependent severe obstructive lung disease (based on 2015 PFT) who presented to [facility] on 4/30 with reports of shortness of breath.    The patient was admitted from 4/7-4/11 with suspected COPD exacerbation.  During that admit, he was treated with IV steroids, nebulized bronchodilators and BiPAP.  He was discharged on spiriva, flovent + PRN albuterol.  His home Trelogy was stopped.    The patient returned 4/30 with shortness of breath and weakness.  He reported on admit that he was so weak that he would get dizzy and fall (denied LOC).  He states he has had "a bunch of sputum".  Predominately white in color.  He reports taking his prednisone after discharge.  States he continues to smoke 2-3 cigarettes per day. Relays ''chest pain" that he describes as pressure and feeling like he can not take deep breath. CTA of the chest evaluated 5/1 which was negative for PE but showed emphysema.  He was treated with IV steroids (40 mg IV Q12) until 5/3 when he was transitioned to prednisone.  He also received IV vancomycin / zosyn during admission for empiric coverage.  These were discontinued on 5/1 with negative CT imaging.   The patient continued to complain of pain, SOB.  PCCM consulted for pulmonary evaluation.   PAST MEDICAL HISTORY :   has a past medical history of Bipolar 1 disorder ([place]) (02/05/2012), Cancer ([place]) (04/11/11), COPD (chronic obstructive pulmonary disease) ([place]), Full dentures, Hemorrhoids, Inguinal hernia, Prostate cancer ([place]), and Rib fractures.   has a past surgical history that includes Colostomy revision (04/11/2011); Colon surgery (04/11/11); Portacath placement; Portacath placement (05/02/2011); Inguinal hernia repair (Right, 05/01/2012);  Insertion of mesh (Right, 05/01/2012); and Port-a-cath removal (Left, 05/01/2012).  Prior to Admission medications   Medication Sig Start Date End Date Taking? Authorizing Provider  albuterol (PROVENTIL) (2.5 MG/3ML) 0.083% nebulizer solution Take 3 mLs (2.5 mg total) by nebulization every 6 (six) hours as needed for wheezing or shortness of breath. Dx: J44.9 05/18/17 06/17/17 Yes Nita Sells, MD  ARIPiprazole (ABILIFY) 5 MG tablet Take 1 tablet (5 mg total) by mouth every morning. 05/18/17  Yes Nita Sells, MD  escitalopram (LEXAPRO) 20 MG tablet Take 1 tablet (20 mg total) by mouth every morning. 05/18/17  Yes Nita Sells, MD  fluticasone (FLOVENT HFA) 110 MCG/ACT inhaler Inhale 2 puffs into the lungs 2 (two) times daily. 05/18/17 07/17/17 Yes Nita Sells, MD  lovastatin (MEVACOR) 20 MG tablet Take 1 tablet (20 mg total) by mouth at bedtime. 05/18/17  Yes Nita Sells, MD  mometasone-formoterol (DULERA) 100-5 MCG/ACT AERO Inhale 2 puffs into the lungs 2 (two) times daily. 05/18/17  Yes Nita Sells, MD  OXYGEN Inhale 3 L into the lungs.    Yes [provider]  predniSONE (DELTASONE) 20 MG tablet Take 2 tablets (40 mg total) by mouth daily before breakfast. 05/19/17  Yes Nita Sells, MD  tiotropium (SPIRIVA) 18 MCG inhalation capsule Place 1 capsule (18 mcg total) into inhaler and inhale daily. 05/18/17 06/17/17 Yes Nita Sells, MD    Allergies  Allergen Reactions  . Codeine Nausea And Vomiting    FAMILY HISTORY:  family history includes Heart disease in his father; Lung cancer in his maternal  uncle.  SOCIAL HISTORY:  reports that he quit smoking about 16 months ago. His smoking use included cigarettes. He has a 45.00 pack-year smoking history. He has quit using smokeless tobacco. He reports that he does not drink alcohol or use drugs.  REVIEW OF SYSTEMS:  POSITIVES IN BOLD Constitutional: Negative for fever, chills, weight  loss, malaise/fatigue and diaphoresis.  HENT: Negative for hearing loss, ear pain, nosebleeds, congestion, sore throat, neck pain, tinnitus and ear discharge.   Eyes: Negative for blurred vision, double vision, photophobia, pain, discharge and redness.  Respiratory: Negative for cough, hemoptysis, white sputum production, shortness of breath, wheezing and stridor.   Cardiovascular: Negative for chest pain (describes as pressure,central chest, unable to take deep breath), palpitations, orthopnea, claudication, leg swelling and PND.  Gastrointestinal: Negative for heartburn, nausea, vomiting, abdominal pain, diarrhea, constipation, blood in stool and melena.  Genitourinary: Negative for dysuria, urgency, frequency, hematuria and flank pain.  Musculoskeletal: Negative for myalgias, back pain, joint pain and falls.  Skin: Negative for itching and rash.  Neurological: Negative for dizziness, tingling, tremors, sensory change, speech change, focal weakness, seizures, loss of consciousness, weakness and headaches.  Endo/Heme/Allergies: Negative for environmental allergies and polydipsia. Does not bruise/bleed easily.   SUBJECTIVE:   VITAL SIGNS: Temp:  [98.3 F (36.8 C)-98.5 F (36.9 C)] 98.3 F (36.8 C) (05/03 2108) Pulse Rate:  [71-105] 80 (05/04 0543) Resp:  [15-20] 20 (05/04 0543) BP: (140-160)/(84-99) 140/99 (05/04 0543) SpO2:  [96 %-98 %] 97 % (05/04 0814) FiO2 (%):  [32 %] 32 % (05/03 1410)  PHYSICAL EXAMINATION: General:  Chronically ill appearing male, sitting up in bed Neuro:  AAOx4, speech clear, MAE HEENT:  MM pink/moist, no jvd Cardiovascular:  s1s2 rrr, no m/r/g Lungs:  Even/non-labored, lungs bilaterally with diminished air flow with expiratory wheezing Abdomen:  Obese/soft, bsx4 active  Musculoskeletal:  No acute deformities  Skin:  Warm/dry, no edema   Recent Labs  Lab 05/20/17 2327 05/21/17 0305 05/22/17 0419  NA 138 136 136  K 4.6 4.2 4.6  CL 97* 92* 97*  CO2  35* 30 34*  BUN 10 14 11   CREATININE 0.72 1.11 0.75  GLUCOSE 159* 327* 218*    Recent Labs  Lab 05/20/17 2157 05/21/17 0305  HGB 14.0 12.4*  HCT 42.3 38.6*  WBC 10.2 9.1  PLT 203 152    No results found.    SIGNIFICANT EVENTS  4/30  Readmit with SOB, weakness   STUDIES CTA Chest 5/1 >> no evidence of PE, emphysematous changes in the lungs, no active pulmonary disease  CULTURES BCx2 5/1 >>   ANTIBIOTICS  Vanco 5/1 x1 Zosyn 5/1 x1    ASSESSMENT / PLAN:  Discussion:  62 y/o M, current smoker, with 2L severe O2 dependent COPD re-admitted with weakness and SOB.  The patient was recently admitted for AECOPD.  Work up thus far includes a negative CTA of the chest for PE, negative PCT, UDS negative.  No infiltrate or edema noted on radiographic review.  He has had 6 admissions in the last 6 months.  His social situation is difficult given he lives a a boarding house and has difficulty getting medications (or they sometimes get stolen).  Note elevated eosinophils on differential from 4/23.  Suspect his current situation is slow to resolve AECOPD superimposed on chronic lung disease.    Acute on Chronic Hypoxic Respiratory Failure  P: O2 as needed to support sats 88-95% Early ambulation  Pulmonary hygiene - IS, mobilize  Severe Obstructive Lung Disease with Acute Exacerbation - not resolved, recent admit.  Anticipate he will be slow to resolve given ongoing smoking + recent exacerbation.   P: Increase steroids to 40 mg IV BID Now PRN dose of morphine for increased work of breathing  PRN BiPAP for increased work of breathing (pt requesting)  Adjust pulmicort dosing  Continue Xopenex Q6 + PRN  Add atrovent Q6 + PRN  Continue Mucinex  Follow intermittent CXR, assess now Discharge plan > [place] (covered by medicaid).  He may benefit from [place]  Tobacco Abuse P: Smoking cessation counseling   Chest Pressure - ACS ruled out, likely from underlying  lung disease   Noe Gens, NP-C [facility] Pgr: [phone] or if no answer [phone] 05/24/2017, 12:22 PM

## 2017-05-24 NOTE — Progress Notes (Signed)
PROGRESS NOTE   Taylor [name]  LGX:[id number]    DOB: 27-Oct-1955    DOA: 05/20/2017  PCP: Patient, No Pcp Per   I have briefly reviewed patients previous medical records in [facility].  Brief Narrative:  62 year old male with PMH of COPD, chronic respiratory failure with hypoxia on home oxygen 2.5-3 L/min continuously, ongoing tobacco abuse, bipolar disorder, prostate and colon cancer, hospitalized 4/7-4/11 for COPD exacerbation presented to ED 05/20/2017 with worsening dyspnea, weakness, dizziness, wheezing and cough with some sputum.  CT chest negative for PE or acute abnormalities.  Admitted for COPD exacerbation, acute on chronic hypoxic respiratory failure and elevated lactate.  Improving.  Developed chest pain 5/2 evening and morning of 5/3.  Cardiology consulted, ACS ruled out, no further work-up recommended.  Despite treatment, waxing and waning respiratory status, again worsened dyspnea/chest tightness on 5/4.  Pulmonology consulted to assist with management.   Assessment & Plan:   Principal Problem:   SIRS (systemic inflammatory response syndrome) (HCC) Active Problems:   Bipolar 1 disorder (HCC)   COPD with acute exacerbation (HCC)   COPD exacerbation: May have been precipitated by ongoing tobacco abuse, allergies.  RSV 4/23 was negative.  CTA chest 5/1: No PE or active pulmonary disease.  Emphysema noted.  Continue Xopenex nebulizations, Pulmicort, IV Solu-Medrol 40 mg every 12 hours.  Recently completed course of doxycycline.  Procalcitonin <0.1.  No clear infectious etiology and hence will discontinued empirically started Zosyn, vancomycin and levofloxacin.  Flutter valve added.  Tobacco cessation counseled.  Continues to improve.  There may be a component of anxiety to his dyspnea as well.  Patient was transitioned to oral prednisone on 5/4.  However this morning patient reports worsening dyspnea, chest tightness/heaviness and does have worsening bronchospasm.   Discussed with and pulmonology consulted.  Atypical chest pain: Serial EKGs without acute changes, troponin x5 neg.  CTA chest negative for PE.  He developed chest pain 5/2 evening and then again 5/3 a.m.  Cardiology input appreciated and suspect not really chest pain but more from dyspnea or GI symptoms and do not recommend further work-up unless has recurring chest pain and patient also does not wish further evaluation.  Currently resolved.  Added PPI.  Ongoing chest tightness likely related to COPD exacerbation.  Acute on chronic hypoxic respiratory failure: Secondary to COPD exacerbation.  Management as above and wean to home oxygen as tolerated.  Patient was down to 3 L/min nasal cannula oxygen and saturating in the high 90s.  TTE 5/2: LVEF 65-70%.  Wall motion was normal and there were no regional wall motion abnormalities.  Grade 1 diastolic dysfunction.  RV: Moderate hypokinesis, particularly towards the apex?  Significance.  Now down to 3 L/min nasal cannula oxygen.  Elevated lactate: Likely related to acute respiratory failure.  Hydrated with IV fluids.  Resolved.  SIRS: Likely secondary to respiratory distress.  No obvious focus of sepsis.  EKG reviewed by admitting physician with cardiology and noted sinus tachycardia.  Resolved.  TSH normal.  Tobacco abuse: Cessation counseled.  Patient requests nicotine patch, added.  Bipolar disorder: Stable.  Continue Lexapro and Abilify.  Hyperlipidemia: Statins.  Uncontrolled DM2: A1c 04/29/2017: 6.5.  Now precipitated by steroids.  Added SSI.  Mildly uncontrolled and fluctuating.  Should get better after taper of steroids.  No change in regimen today.     DVT prophylaxis: Lovenox Code Status: DNI Family Communication: None at bedside Disposition: Patient not medically stable for discharge.  Consultants:  Cardiology-signed off  5/3. Pulmonology  Procedures:  None  Antimicrobials:  Discontinued vancomycin, levofloxacin and Zosyn  5/1.   Subjective: Patient reports worsening dyspnea, wheezing and chest tightness this morning.  Indicates that his breathing is not even 50% better compared to admission.  No cough.  Discussed with RN.  ROS: As above.  Objective:  Vitals:   05/23/17 2108 05/24/17 0254 05/24/17 0543 05/24/17 0814  BP: (!) 149/97  (!) 140/99   Pulse: (!) 105  80   Resp: 20  20   Temp: 98.3 F (36.8 C)     TempSrc:      SpO2: 97% 98% 96% 97%  Weight:      Height:        Examination:  General exam: Pleasant middle-aged male, moderately built and nourished male lying propped up in bed, looks slightly worse than he did yesterday, mild increased work of breathing. Respiratory system: Decreased breath sounds bilaterally with scattered bilateral few medium pitched expiratory rhonchi.  This is worse than yesterday.  Pursed lip breathing.  No accessory muscle use.  Mild increased work of breathing. Cardiovascular system: S1 & S2 heard, RRR. No JVD, murmurs, rubs, gallops or clicks. No pedal edema.  Telemetry personally reviewed: Sinus rhythm.  Brief episode of nonsustained SVT.  No reproducible chest wall tenderness. Gastrointestinal system: Abdomen is nondistended, soft and nontender. No organomegaly or masses felt. Normal bowel sounds heard.  Stable. Central nervous system: Alert and oriented. No focal neurological deficits.  Stable. Extremities: Symmetric 5 x 5 power. Skin: No rashes, lesions or ulcers Psychiatry: Judgement and insight appear normal. Mood & affect appropriate.     Data Reviewed: I have personally reviewed following labs and imaging studies  CBC: Recent Labs  Lab 05/20/17 2157 05/21/17 0305  WBC 10.2 9.1  HGB 14.0 12.4*  HCT 42.3 38.6*  MCV 94.2 96.5  PLT 203 956   Basic Metabolic Panel: Recent Labs  Lab 05/20/17 2327 05/21/17 0305 05/22/17 0419  NA 138 136 136  K 4.6 4.2 4.6  CL 97* 92* 97*  CO2 35* 30 34*  GLUCOSE 159* 327* 218*  BUN 10 14 11   CREATININE 0.72  1.11 0.75  CALCIUM 8.0* 8.2* 8.0*  MG  --  2.6*  --    Cardiac Enzymes: Recent Labs  Lab 05/21/17 0757 05/21/17 1514 05/22/17 1807 05/23/17 0116 05/23/17 0711  TROPONINI <0.03 <0.03 <0.03 <0.03 <0.03   HbA1C: No results for input(s): HGBA1C in the last 72 hours. CBG: Recent Labs  Lab 05/23/17 1227 05/23/17 1647 05/23/17 2107 05/24/17 0830 05/24/17 1202  GLUCAP 120* 271* 154* 95 219*    Recent Results (from the past 240 hour(s))  Culture, blood (routine x 2)     Status: None (Preliminary result)   Collection Time: 05/21/17  3:05 AM  Result Value Ref Range Status   Specimen Description BLOOD RIGHT ANTECUBITAL  Final   Special Requests   Final    BOTTLES DRAWN AEROBIC AND ANAEROBIC Blood Culture adequate volume   Culture   Final    NO GROWTH 3 DAYS Performed at [facility], [place] [address], [place], [place] [address]    Report Status PENDING  Incomplete  Culture, blood (routine x 2)     Status: None (Preliminary result)   Collection Time: 05/21/17  3:14 AM  Result Value Ref Range Status   Specimen Description BLOOD RIGHT FOREARM  Final   Special Requests   Final    BOTTLES DRAWN AEROBIC AND ANAEROBIC Blood Culture  adequate volume   Culture   Final    NO GROWTH 3 DAYS Performed at [facility], [place] [address], [place], [place] [address]    Report Status PENDING  Incomplete         Radiology Studies: No results found.      Scheduled Meds: . ARIPiprazole  5 mg Oral Daily  . aspirin  324 mg Oral Daily  . budesonide (PULMICORT) nebulizer solution  0.25 mg Nebulization BID  . enoxaparin (LOVENOX) injection  40 mg Subcutaneous Q24H  . escitalopram  20 mg Oral Daily  . hydrochlorothiazide  6.25 mg Oral Daily  . insulin aspart  0-5 Units Subcutaneous QHS  . insulin aspart  0-9 Units Subcutaneous TID WC  . levalbuterol  0.63 mg Nebulization Q6H  . mouth rinse  15 mL Mouth Rinse BID  . nicotine  7 mg Transdermal Daily  . pantoprazole  40 mg  Oral Daily  . pravastatin  40 mg Oral q1800  . predniSONE  40 mg Oral Q breakfast   Continuous Infusions:    LOS: 3 days     Vernell Leep, MD, FACP, [facility]. [facility] Pager [phone]  If 7PM-7AM, please contact night-coverage [email or web address] Password [facility] 05/24/2017, 12:33 PM

## 2017-05-25 DIAGNOSIS — Z720 Tobacco use: Secondary | ICD-10-CM

## 2017-05-25 LAB — GLUCOSE, CAPILLARY
GLUCOSE-CAPILLARY: 165 mg/dL — AB (ref 65–99)
GLUCOSE-CAPILLARY: 374 mg/dL — AB (ref 65–99)
Glucose-Capillary: 206 mg/dL — ABNORMAL HIGH (ref 65–99)
Glucose-Capillary: 209 mg/dL — ABNORMAL HIGH (ref 65–99)

## 2017-05-25 NOTE — Progress Notes (Signed)
PROGRESS NOTE   DRYDEN TAPLEY  PJA:[id number]    DOB: 12/10/1955    DOA: 05/20/2017  PCP: Patient, No Pcp Per   I have briefly reviewed patients previous medical records in [facility].  Brief Narrative:  62 year old male with PMH of COPD, chronic respiratory failure with hypoxia on home oxygen 2.5-3 L/min continuously, ongoing tobacco abuse, bipolar disorder, prostate and colon cancer, hospitalized 4/7-4/11 for COPD exacerbation presented to ED 05/20/2017 with worsening dyspnea, weakness, dizziness, wheezing and cough with some sputum.  CT chest negative for PE or acute abnormalities.  Admitted for COPD exacerbation, acute on chronic hypoxic respiratory failure and elevated lactate.  Improving.  Developed chest pain 5/2 evening and morning of 5/3.  Cardiology consulted, ACS ruled out, no further work-up recommended.  Despite treatment, waxing and waning respiratory status, again worsened dyspnea/chest tightness on 5/4.  Pulmonology consulted to assist with management.   Assessment & Plan:   Principal Problem:   SIRS (systemic inflammatory response syndrome) (HCC) Active Problems:   Bipolar 1 disorder (HCC)   COPD with acute exacerbation (HCC)   COPD exacerbation/severe COPD: May have been precipitated by ongoing tobacco abuse, allergies.  RSV 4/23 was negative.  CTA chest 5/1: No PE or active pulmonary disease.  Emphysema noted.  Continue Xopenex nebulizations, Pulmicort, IV Solu-Medrol 40 mg every 12 hours.  Recently completed course of doxycycline.  Procalcitonin <0.1.  No clear infectious etiology and hence will discontinued empirically started Zosyn, vancomycin and levofloxacin.  Flutter valve added.  Tobacco cessation counseled. Patient was transitioned to oral prednisone on 5/4.  However 5/4 morning patient appeared to worsen.  Pulmonary was consulted, changed back to IV Solu-Medrol 40 mg every 12, placed on as needed BiPAP.  CCM have seen him today, improved, remains at high  risk, poor prognosis given severity of his COPD, frequent exacerbations, poor access to medicines, ongoing tobacco abuse.  They recommend continuing IV Solu-Medrol for today, changing to prednisone taper in a.m., DC BiPAP, Dulera as outpatient and avoid Spiriva.  CCM signed off 5/5.  Chest x-ray 5/4 without acute findings.  Atypical chest pain: Serial EKGs without acute changes, troponin x5 neg.  CTA chest negative for PE.  He developed chest pain 5/2 evening and then again 5/3 a.m.  Cardiology input appreciated and suspect not really chest pain but more from dyspnea or GI symptoms and do not recommend further work-up unless has recurring chest pain and patient also does not wish further evaluation.  Currently resolved.  Added PPI.  Has not reported of any further chest discomfort.  Acute on chronic hypoxic respiratory failure: Secondary to COPD exacerbation.  Management as above and wean to home oxygen as tolerated.  Patient was down to 3 L/min nasal cannula oxygen and saturating in the high 90s.  TTE 5/2: LVEF 65-70%.  Wall motion was normal and there were no regional wall motion abnormalities.  Grade 1 diastolic dysfunction.  RV: Moderate hypokinesis, particularly towards the apex?  Significance.  Now down to 3 L/min nasal cannula oxygen.  Elevated lactate: Likely related to acute respiratory failure.  Hydrated with IV fluids.  Resolved.  SIRS: Likely secondary to respiratory distress.  No obvious focus of sepsis.  EKG reviewed by admitting physician with cardiology and noted sinus tachycardia.  Resolved.  TSH normal.  Tobacco abuse: Cessation counseled.  Patient requests nicotine patch, added.  Bipolar disorder: Stable.  Continue Lexapro and Abilify.  Hyperlipidemia: Statins.  Uncontrolled DM2: A1c 04/29/2017: 6.5.  Now precipitated by steroids.  Added SSI.  Uncontrolled and fluctuating.  Continue SSI alone for now.   DVT prophylaxis: Lovenox Code Status: DNI Family Communication: None at  bedside Disposition: Improving slowly.  Reassess in a.m. for possible discharge.  Consultants:  Cardiology-signed off 5/3. Pulmonology  Procedures:  None  Antimicrobials:  Discontinued vancomycin, levofloxacin and Zosyn 5/1.   Subjective: States that he feels better than he did yesterday.  Still some dyspnea.  No chest pain or cough.  Discussed with pulmonology.  ROS: As above.  Objective:  Vitals:   05/25/17 0205 05/25/17 0330 05/25/17 0355 05/25/17 0435  BP:    124/85  Pulse:  84 86 79  Resp:  20 18 18   Temp:    98 F (36.7 C)  TempSrc:    Oral  SpO2: 99% 100% 98% 99%  Weight:      Height:        Examination:  General exam: Pleasant middle-aged male, moderately built and nourished male lying propped up in bed, looks much improved compared to yesterday. Respiratory system: Improved breath sounds bilaterally.  Occasional bilateral expiratory rhonchi.  No increased work of breathing or pursed lip breathing this morning. Cardiovascular system: S1 & S2 heard, RRR. No JVD, murmurs, rubs, gallops or clicks. No pedal edema.  Telemetry personally reviewed: Sinus rhythm. Gastrointestinal system: Abdomen is nondistended, soft and nontender. No organomegaly or masses felt. Normal bowel sounds heard.  Stable Central nervous system: Alert and oriented. No focal neurological deficits.  Stable Extremities: Symmetric 5 x 5 power. Skin: No rashes, lesions or ulcers Psychiatry: Judgement and insight appear normal. Mood & affect appropriate.     Data Reviewed: I have personally reviewed following labs and imaging studies  CBC: Recent Labs  Lab 05/20/17 2157 05/21/17 0305  WBC 10.2 9.1  HGB 14.0 12.4*  HCT 42.3 38.6*  MCV 94.2 96.5  PLT 203 299   Basic Metabolic Panel: Recent Labs  Lab 05/20/17 2327 05/21/17 0305 05/22/17 0419  NA 138 136 136  K 4.6 4.2 4.6  CL 97* 92* 97*  CO2 35* 30 34*  GLUCOSE 159* 327* 218*  BUN 10 14 11   CREATININE 0.72 1.11 0.75  CALCIUM  8.0* 8.2* 8.0*  MG  --  2.6*  --    Cardiac Enzymes: Recent Labs  Lab 05/21/17 0757 05/21/17 1514 05/22/17 1807 05/23/17 0116 05/23/17 0711  TROPONINI <0.03 <0.03 <0.03 <0.03 <0.03   HbA1C: No results for input(s): HGBA1C in the last 72 hours. CBG: Recent Labs  Lab 05/24/17 1202 05/24/17 1745 05/24/17 2202 05/25/17 0808 05/25/17 1205  GLUCAP 219* 231* 356* 165* 206*    Recent Results (from the past 240 hour(s))  Culture, blood (routine x 2)     Status: None (Preliminary result)   Collection Time: 05/21/17  3:05 AM  Result Value Ref Range Status   Specimen Description BLOOD RIGHT ANTECUBITAL  Final   Special Requests   Final    BOTTLES DRAWN AEROBIC AND ANAEROBIC Blood Culture adequate volume   Culture   Final    NO GROWTH 4 DAYS Performed at [facility], [place] [address], [place], [place] [address]    Report Status PENDING  Incomplete  Culture, blood (routine x 2)     Status: None (Preliminary result)   Collection Time: 05/21/17  3:14 AM  Result Value Ref Range Status   Specimen Description BLOOD RIGHT FOREARM  Final   Special Requests   Final    BOTTLES DRAWN AEROBIC AND ANAEROBIC Blood Culture  adequate volume   Culture   Final    NO GROWTH 4 DAYS Performed at [facility], [place] [address] [place], [place] [address]    Report Status PENDING  Incomplete         Radiology Studies: Dg Chest Port 1 View  Result Date: 05/24/2017 CLINICAL DATA:  Central chest pain and shortness of breath for 3 days. EXAM: PORTABLE CHEST 1 VIEW COMPARISON:  Chest CT 05/21/2017, chest radiograph 05/20/2017 FINDINGS: Cardiomediastinal silhouette is normal. Mediastinal contours appear intact. There is no evidence of focal airspace consolidation, pleural effusion or pneumothorax. Emphysematous changes with hyperinflation of the lungs again seen. Osseous structures are without acute abnormality. Soft tissues are grossly normal. IMPRESSION: Emphysematous changes with  hyperinflation of the lungs. No evidence of focal consolidation. Electronically Signed   By: Fidela Salisbury M.D.   On: 05/24/2017 17:20        Scheduled Meds: . ARIPiprazole  5 mg Oral Daily  . aspirin  324 mg Oral Daily  . budesonide (PULMICORT) nebulizer solution  0.5 mg Nebulization BID  . enoxaparin (LOVENOX) injection  40 mg Subcutaneous Q24H  . escitalopram  20 mg Oral Daily  . hydrochlorothiazide  6.25 mg Oral Daily  . insulin aspart  0-5 Units Subcutaneous QHS  . insulin aspart  0-9 Units Subcutaneous TID WC  . ipratropium  0.5 mg Nebulization Q6H  . levalbuterol  0.63 mg Nebulization Q6H  . mouth rinse  15 mL Mouth Rinse BID  . methylPREDNISolone (SOLU-MEDROL) injection  40 mg Intravenous Q12H  . nicotine  7 mg Transdermal Daily  . pantoprazole  40 mg Oral Daily  . pravastatin  40 mg Oral q1800   Continuous Infusions:    LOS: 4 days     Vernell Leep, MD, FACP, [facility]. [facility] Pager [phone]  If 7PM-7AM, please contact night-coverage [email or web address] Password [email or web address] 05/25/2017, 12:33 PM

## 2017-05-25 NOTE — Progress Notes (Signed)
Name: Taylor [name] MRN: [id number] DOB: [birth year]    ADMISSION DATE:  05/20/2017 CONSULTATION DATE:  05/24/17  REFERRING MD :  Dr. Algis Liming / [facility]  CHIEF COMPLAINT:  Shortness of Breath    HISTORY OF PRESENT ILLNESS:  62 y/o M with 2L O2 dependent severe obstructive lung disease (based on 2015 PFT) who presented to [facility] on 4/30 with reports of shortness of breath.    The patient was admitted from 4/7-4/11 with suspected COPD exacerbation.  During that admit, he was treated with IV steroids, nebulized bronchodilators and BiPAP.  He was discharged on spiriva, flovent + PRN albuterol.  His home Trelogy was stopped.    The patient returned 4/30 with shortness of breath and weakness.  He reported on admit that he was so weak that he would get dizzy and fall (denied LOC).  He states he has had "a bunch of sputum".  Predominately white in color.  He reports taking his prednisone after discharge.  States he continues to smoke 2-3 cigarettes per day. Relays ''chest pain" that he describes as pressure and feeling like he can not take deep breath. CTA of the chest evaluated 5/1 which was negative for PE but showed emphysema.  He was treated with IV steroids (40 mg IV Q12) until 5/3 when he was transitioned to prednisone.  He also received IV vancomycin / zosyn during admission for empiric coverage.  These were discontinued on 5/1 with negative CT imaging.   The patient continued to complain of pain, SOB.  PCCM consulted for pulmonary evaluation.    SUBJECTIVE:  Pt reports feeling some better.  Has not been out of bed.  Denies fever, chills.   VITAL SIGNS: Temp:  [98 F (36.7 C)-98.8 F (37.1 C)] 98 F (36.7 C) (05/05 0435) Pulse Rate:  [79-86] 79 (05/05 0435) Resp:  [17-24] 18 (05/05 0435) BP: (121-126)/(72-87) 124/85 (05/05 0435) SpO2:  [96 %-100 %] 99 % (05/05 0435) FiO2 (%):  [30 %] 30 % (05/05 0205)  PHYSICAL EXAMINATION: General:  Adult male in NAD lying in bed HEENT: MM  pink/moist, no jvd Neuro: AAOx4, MAE, normal strength  CV: s1s2 rrr, no m/r/g PULM: even/non-labored, lungs bilaterally clear, improved air movement ZD:GUYQ, non-tender, bsx4 active  Extremities: warm/dry, no edema  Skin: no rashes or lesions   Recent Labs  Lab 05/20/17 2327 05/21/17 0305 05/22/17 0419  NA 138 136 136  K 4.6 4.2 4.6  CL 97* 92* 97*  CO2 35* 30 34*  BUN 10 14 11   CREATININE 0.72 1.11 0.75  GLUCOSE 159* 327* 218*    Recent Labs  Lab 05/20/17 2157 05/21/17 0305  HGB 14.0 12.4*  HCT 42.3 38.6*  WBC 10.2 9.1  PLT 203 152    Dg Chest Port 1 View  Result Date: 05/24/2017 CLINICAL DATA:  Central chest pain and shortness of breath for 3 days. EXAM: PORTABLE CHEST 1 VIEW COMPARISON:  Chest CT 05/21/2017, chest radiograph 05/20/2017 FINDINGS: Cardiomediastinal silhouette is normal. Mediastinal contours appear intact. There is no evidence of focal airspace consolidation, pleural effusion or pneumothorax. Emphysematous changes with hyperinflation of the lungs again seen. Osseous structures are without acute abnormality. Soft tissues are grossly normal. IMPRESSION: Emphysematous changes with hyperinflation of the lungs. No evidence of focal consolidation. Electronically Signed   By: Fidela Salisbury M.D.   On: 05/24/2017 17:20    SIGNIFICANT EVENTS  4/30  Readmit with SOB, weakness  5/04  PCCM consulted for pulm eval  5/05  Improved  air flow, off bipap   STUDIES CTA Chest 5/1 >> no evidence of PE, emphysematous changes in the lungs, no active pulmonary disease  CULTURES BCx2 5/1 >>   ANTIBIOTICS  Vanco 5/1 x1 Zosyn 5/1 x1    ASSESSMENT / PLAN:  Discussion:  62 y/o M, current smoker, with 2L severe O2 dependent COPD re-admitted with weakness and SOB.  The patient was recently admitted for AECOPD.  Work up thus far includes a negative CTA of the chest for PE, negative PCT, UDS negative.  No infiltrate or edema noted on radiographic review.  He has had 6  admissions in the last 6 months.  His social situation is difficult given he lives a a boarding house and has difficulty getting medications (or they sometimes get stolen).  Note elevated eosinophils on differential from 4/23.  Suspect his current situation is slow to resolve AECOPD superimposed on chronic lung disease.    Acute on Chronic Hypoxic Respiratory Failure  P: O2 to support sats 88-95% PT efforts / ambulate  Pulmonary hygiene - IS, mobilize  Severe Obstructive Lung Disease with Acute Exacerbation - not resolved, recent admit.  Anticipate he will be slow to resolve given ongoing smoking + recent exacerbation.   P: Continue solumedrol 40 mg IV BID for today, consider reduction 5/6  PRN BiPAP for work of breathing  PRN morphine for dyspnea  Continue xopenex + atrovent Q6 + PRN  Mucinex  Follow intermittent CXR Plan to discharge on Dulera > per last admit Care Mgmt is covered by medicaid.  In addition, he will need PRN albuterol  Consider Community Health / Wellness referral  Pulmonary follow up at discharge   Tobacco Abuse P: Smoking cessation   Chest Pressure - ACS ruled out, likely from underlying lung disease   Noe Gens, NP-C [facility] Pgr: [phone] or if no answer [phone] 05/25/2017, 9:25 AM

## 2017-05-26 DIAGNOSIS — R739 Hyperglycemia, unspecified: Secondary | ICD-10-CM

## 2017-05-26 LAB — GLUCOSE, CAPILLARY
Glucose-Capillary: 282 mg/dL — ABNORMAL HIGH (ref 65–99)
Glucose-Capillary: 268 mg/dL — ABNORMAL HIGH (ref 65–99)
Glucose-Capillary: 207 mg/dL — ABNORMAL HIGH (ref 65–99)

## 2017-05-26 LAB — CULTURE, BLOOD (ROUTINE X 2)
CULTURE: NO GROWTH
Culture: NO GROWTH
SPECIAL REQUESTS: ADEQUATE
SPECIAL REQUESTS: ADEQUATE

## 2017-05-26 MED ORDER — IPRATROPIUM BROMIDE 0.02 % IN SOLN
0.5000 mg | Freq: Three times a day (TID) | RESPIRATORY_TRACT | Status: DC
  Administered 2017-05-26 (×2): 0.5 mg via RESPIRATORY_TRACT
  Filled 2017-05-26 (×3): qty 2.5

## 2017-05-26 MED ORDER — PREDNISONE 10 MG PO TABS: ORAL_TABLET | ORAL | 0 refills | Status: DC

## 2017-05-26 MED ORDER — PREDNISONE 20 MG PO TABS
40.0000 mg | ORAL_TABLET | Freq: Every day | ORAL | Status: DC
  Administered 2017-05-26: 40 mg via ORAL
  Filled 2017-05-26: qty 2

## 2017-05-26 MED ORDER — LEVALBUTEROL HCL 0.63 MG/3ML IN NEBU
0.6300 mg | INHALATION_SOLUTION | Freq: Three times a day (TID) | RESPIRATORY_TRACT | Status: DC
  Administered 2017-05-26 (×2): 0.63 mg via RESPIRATORY_TRACT
  Filled 2017-05-26 (×3): qty 3

## 2017-05-26 NOTE — Discharge Instructions (Signed)
Please get your medications reviewed and adjusted by your Primary MD. ° °Please request your Primary MD to go over all Hospital Tests and Procedure/Radiological results at the follow up, please get all Hospital records sent to your Prim MD by signing hospital release before you go home. ° °If you had Pneumonia of Lung problems at the Hospital: °Please get a 2 view Chest X ray done in 6-8 weeks after hospital discharge or sooner if instructed by your Primary MD. ° °If you have Congestive Heart Failure: °Please call your Cardiologist or Primary MD anytime you have any of the following symptoms:  °1) 3 pound weight gain in 24 hours or 5 pounds in 1 week  °2) shortness of breath, with or without a dry hacking cough  °3) swelling in the hands, feet or stomach  °4) if you have to sleep on extra pillows at night in order to breathe ° °Follow cardiac low salt diet and 1.5 lit/day fluid restriction. ° °If you have diabetes °Accuchecks 4 times/day, Once in AM empty stomach and then before each meal. °Log in all results and show them to your primary doctor at your next visit. °If any glucose reading is under 80 or above 300 call your primary MD immediately. ° °If you have Seizure/Convulsions/Epilepsy: °Please do not drive, operate heavy machinery, participate in activities at heights or participate in high speed sports until you have seen by Primary MD or a Neurologist and advised to do so again. ° °If you had Gastrointestinal Bleeding: °Please ask your Primary MD to check a complete blood count within one week of discharge or at your next visit. Your endoscopic/colonoscopic biopsies that are pending at the time of discharge, will also need to followed by your Primary MD. ° °Get Medicines reviewed and adjusted. °Please take all your medications with you for your next visit with your Primary MD ° °Please request your Primary MD to go over all hospital tests and procedure/radiological results at the follow up, please ask your  Primary MD to get all Hospital records sent to his/her office. ° °If you experience worsening of your admission symptoms, develop shortness of breath, life threatening emergency, suicidal or homicidal thoughts you must seek medical attention immediately by calling 911 or calling your MD immediately  if symptoms less severe. ° °You must read complete instructions/literature along with all the possible adverse reactions/side effects for all the Medicines you take and that have been prescribed to you. Take any new Medicines after you have completely understood and accpet all the possible adverse reactions/side effects.  ° °Do not drive or operate heavy machinery when taking Pain medications.  ° °Do not take more than prescribed Pain, Sleep and Anxiety Medications ° °Special Instructions: If you have smoked or chewed Tobacco  in the last 2 yrs please stop smoking, stop any regular Alcohol  and or any Recreational drug use. ° °Wear Seat belts while driving. ° °Please note °You were cared for by a hospitalist during your hospital stay. If you have any questions about your discharge medications or the care you received while you were in the hospital after you are discharged, you can call the unit and asked to speak with the hospitalist on call if the hospitalist that took care of you is not available. Once you are discharged, your primary care physician will handle any further medical issues. Please note that NO REFILLS for any discharge medications will be authorized once you are discharged, as it is imperative that you   return to your primary care physician (or establish a relationship with a primary care physician if you do not have one) for your aftercare needs so that they can reassess your need for medications and monitor your lab values.  You can reach the hospitalist office at phone [phone] or fax [phone]   If you do not have a primary care physician, you can call [phone] for a physician  referral.   Chronic Obstructive Pulmonary Disease Exacerbation Chronic obstructive pulmonary disease (COPD) is a long-term (chronic) condition that affects the lungs. COPD is a general term that can be used to describe many different lung problems that cause lung swelling (inflammation) and limit airflow, including chronic bronchitis and emphysema. COPD exacerbations are episodes when breathing symptoms become much worse and require extra treatment. COPD exacerbations are usually caused by infections. Without treatment, COPD exacerbations can be severe and even life threatening. Frequent COPD exacerbations can cause further damage to the lungs. What are the causes? This condition may be caused by:  Respiratory infections, including viral and bacterial infections.  Exposure to smoke.  Exposure to air pollution, chemical fumes, or dust.  Things that give you an allergic reaction (allergens).  Not taking your usual COPD medicines as directed.  Underlying medical problems, such as congestive heart failure or infections not involving the lungs.  In many cases, the cause (trigger) of this condition is not known. What increases the risk? The following factors may make you more likely to develop this condition:  Smoking cigarettes.  Old age.  Frequent prior COPD exacerbations.  What are the signs or symptoms? Symptoms of this condition include:  Increased coughing.  Increased production of mucus from your lungs (sputum).  Increased wheezing.  Increased shortness of breath.  Rapid or labored breathing.  Chest tightness.  Less energy than usual.  Sleep disruption from symptoms.  Confusion or increased sleepiness.  Often these symptoms happen or get worse even with the use of medicines. How is this diagnosed? This condition is diagnosed based on:  Your medical history.  A physical exam.  You may also have tests, including:  A chest X-ray.  Blood tests.  Lung  (pulmonary) function tests.  How is this treated? Treatment for this condition depends on the severity and cause of the symptoms. You may need to be admitted to a hospital for treatment. Some of the treatments commonly used to treat COPD exacerbations are:  Antibiotic medicines. These may be used for severe exacerbations caused by a lung infection, such as pneumonia.  Bronchodilators. These are inhaled medicines that expand the air passages and allow increased airflow.  Steroid medicines. These act to reduce inflammation in the airways. They may be given with an inhaler, taken by mouth, or given through an IV tube inserted into one of your veins.  Supplemental oxygen therapy.  Airway clearing techniques, such as noninvasive ventilation (NIV) and positive expiratory pressure (PEP). These provide respiratory support through a mask or other noninvasive device. An example of this would be using a continuous positive airway pressure (CPAP) machine to improve delivery of oxygen into your lungs.  Follow these instructions at home: Medicines  Take over-the-counter and prescription medicines only as told by your health care provider. It is important to use correct technique with inhaled medicines.  If you were prescribed an antibiotic medicine or oral steroid, take it as told by your health care provider. Do not stop taking the medicine even if you start to feel better. Lifestyle  Eat a healthy  diet.  Exercise regularly.  Get plenty of sleep.  Avoid exposure to all substances that irritate the airway, especially to tobacco smoke.  Wash your hands often with soap and water to reduce the risk of infection. If soap and water are not available, use hand sanitizer.  During flu season, avoid enclosed spaces that are crowded with people. General instructions  Drink enough fluid to keep your urine clear or pale yellow (unless you have a medical condition that requires fluid restriction).  Use a  cool mist vaporizer. This humidifies the air and makes it easier for you to clear your chest when you cough.  If you have a home nebulizer and oxygen, continue to use them as told by your health care provider.  Keep all follow-up visits as told by your health care provider. This is important. How is this prevented?  Stay up-to-date on pneumococcal and influenza (flu) vaccines. A flu shot is recommended every year to help prevent exacerbations.  Do not use any products that contain nicotine or tobacco, such as cigarettes and e-cigarettes. Quitting smoking is very important in preventing COPD from getting worse and in preventing exacerbations from happening as often. If you need help quitting, ask your health care provider.  Follow all instructions for pulmonary rehabilitation after a recent exacerbation. This can help prevent future exacerbations.  Work with your health care provider to develop and follow an action plan. This tells you what steps to take when you experience certain symptoms. Contact a health care provider if:  You have a worsening of your regular COPD symptoms. Get help right away if:  You have worsening shortness of breath, even when resting.  You have trouble talking.  You have severe chest pain.  You cough up blood.  You have a fever.  You have weakness, vomit repeatedly, or faint.  You feel confused.  You are not able to sleep because of your symptoms.  You have trouble doing daily activities. Summary  COPD exacerbations are episodes when breathing symptoms become much worse and require extra treatment above your normal treatment.  Exacerbations can be severe and even life threatening. Frequent COPD exacerbations can cause further damage to your lungs.  COPD exacerbations are usually triggered by infections such as the flu, colds, and even pneumonia.  Treatment for this condition depends on the severity and cause of the symptoms. You may need to be  admitted to a hospital for treatment.  Quitting smoking is very important to prevent COPD from getting worse and to prevent exacerbations from happening as often. This information is not intended to replace advice given to you by your health care provider. Make sure you discuss any questions you have with your health care provider. Document Released: 11/04/2006 Document Revised: 02/12/2016 Document Reviewed: 02/12/2016 Elsevier Interactive Patient Education  [employer].

## 2017-05-26 NOTE — Care Management Note (Addendum)
Case Management Note  Patient Details  Name: Taylor [name] MRN: [id number] Date of Birth: 1955/12/29  Subjective/Objective:    Admitted with SIRS (systemic inflammatory response syndrome) / Bipolar 1 disorder / COPD with acute exacerbation,PMH of COPD, chronic respiratory failure with hypoxia on home oxygen 2.5-3 L/min continuously, ongoing tobacco abuse, bipolar disorder, prostate and colon cancer, hospitalized 4/7-4/11 for COPD. From [place]. Frequent admits.   PCP: [facility]              Action/Plan: Transition to home. Pt will need PTAR services for transportation to home. NCM offered home health services . Pt declined, states he doesn't need it. Pt states will f/u with  PCP and Pulmonologist.  Expected Discharge Date:  05/26/17               Expected Discharge Plan:  Home/Self Care  In-House Referral:     Discharge planning Services  CM Consult  Post Acute Care Choice:    Choice offered to:     DME Arranged:   N/A DME Agency:   N/A  HH Arranged:   N/A HH Agency:   N/A  Status of Service:  Completed, signed off  If discussed at [place] of Stay Meetings, dates discussed:    Additional Comments:  Sharin Mons, RN 05/26/2017, 2:27 PM

## 2017-05-26 NOTE — Discharge Summary (Signed)
Physician Discharge Summary  Taylor [name] ZOX:[id number] DOB: [birth year]  PCP: [facility]  Admit date: 05/20/2017 Discharge date: 05/26/2017  Recommendations for Outpatient Follow-up:  1. [facility]/PCP on 06/06/2017 at 10 AM.  To be seen with repeat labs (CBC & BMP).  Please reassess hyperglycemia and need for medications. 2. Dr. Christinia Gully, Pulmonology in 1 week.  Home Health: Patient declined home health services. Equipment/Devices: None.  Discharge Condition: Improved and stable. CODE STATUS: DNI Diet recommendation: Heart healthy & diabetic diet.  Discharge Diagnoses:  Principal Problem:   SIRS (systemic inflammatory response syndrome) (HCC) Active Problems:   Bipolar 1 disorder (HCC)   COPD with acute exacerbation [facility])   Brief Summary: 62 year old male with PMH of COPD, chronic respiratory failure with hypoxia on home oxygen 2.5-3 L/min continuously, ongoing tobacco abuse, bipolar disorder, prostate and colon cancer, hospitalized 4/7-4/11 for COPD exacerbation presented to ED 05/20/2017 with worsening dyspnea, weakness, dizziness, wheezing and cough with some sputum.  CT chest negative for PE or acute abnormalities.  Admitted for COPD exacerbation, acute on chronic hypoxic respiratory failure and elevated lactate.  Developed chest pain 5/2 evening and morning of 5/3.  Cardiology consulted, ACS ruled out, no further work-up recommended.  Despite treatment, waxing and waning respiratory status, again worsened dyspnea/chest tightness on 5/4.  Pulmonology consulted to assist with management.  Improved.   Assessment & Plan:   COPD exacerbation/severe COPD: May have been precipitated by ongoing tobacco abuse, allergies.  RSV 4/23 was negative.  CTA chest 5/1: No PE or active pulmonary disease.  Emphysema noted.  Treated with Xopenex nebulizations, Pulmicort, IV Solu-Medrol 40 mg every 12 hours.  Recently completed course of doxycycline.   Procalcitonin <0.1.  No clear infectious etiology and hence discontinued empirically started Zosyn, vancomycin and levofloxacin.  Flutter valve added.  Tobacco cessation counseled. Patient was transitioned to oral prednisone on 5/4.  However 5/4 morning patient appeared to worsen.  Pulmonary was consulted, changed back to IV Solu-Medrol 40 mg every 12, placed on as needed BiPAP.  CCM continue to see and he improved, remains at high risk, poor prognosis given severity of his COPD, frequent exacerbations, poor access to medicines, ongoing tobacco abuse.  BiPAP discontinued.  Exacerbation resolved.  As per pulmonary recommendations, he was transitioned to oral prednisone taper at discharge, [facility] as outpatient and avoid Spiriva. Chest x-ray 5/4 without acute findings.  Atypical chest pain: Serial EKGs without acute changes, troponin x5 neg.  CTA chest negative for PE.  He developed chest pain 5/2 evening and then again 5/3 a.m.  Cardiology input appreciated and suspect not really chest pain but more from dyspnea or GI symptoms and do not recommend further work-up unless has recurring chest pain and patient also does not wish further evaluation.  Resolved without recurrence.  Acute on chronic hypoxic respiratory failure: Secondary to COPD exacerbation.  Management as above.  TTE 5/2: LVEF 65-70%.  Wall motion was normal and there were no regional wall motion abnormalities.  Grade 1 diastolic dysfunction.  RV: Moderate hypokinesis, particularly towards the apex?  Significance.  Acute respiratory failure resolved.  Elevated lactate: Likely related to acute respiratory failure.  Hydrated with IV fluids.  Resolved.  SIRS: Likely secondary to respiratory distress.  No obvious focus of sepsis.  EKG reviewed by admitting physician with cardiology and noted sinus tachycardia.  Resolved.  TSH normal.  Tobacco abuse: Cessation counseled.    Bipolar disorder: Stable.  Continue Lexapro and  Abilify.  Hyperlipidemia: Statins.  Uncontrolled DM2: A1c 04/29/2017: 6.5.  Now precipitated by steroids.  Added SSI while hospitalized and advised patient that with quick steroid taper as outpatient, glycemic control likely to improve and he may not need any medications as before.  Follow-up with PCP to reassess.     Consultants:  Cardiology-signed off 5/3. Pulmonology  Procedures:  BiPAP.   Discharge Instructions  Discharge Instructions    Call MD for:  difficulty breathing, headache or visual disturbances   Complete by:  As directed    Call MD for:  extreme fatigue   Complete by:  As directed    Call MD for:  persistant dizziness or light-headedness   Complete by:  As directed    Call MD for:  persistant nausea and vomiting   Complete by:  As directed    Call MD for:  severe uncontrolled pain   Complete by:  As directed    Call MD for:  temperature >100.4   Complete by:  As directed    Diet - low sodium heart healthy   Complete by:  As directed    Diet Carb Modified   Complete by:  As directed    Increase activity slowly   Complete by:  As directed        Medication List    STOP taking these medications   tiotropium 18 MCG inhalation capsule Commonly known as:  SPIRIVA     TAKE these medications   albuterol (2.5 MG/3ML) 0.083% nebulizer solution Commonly known as:  PROVENTIL Take 3 mLs (2.5 mg total) by nebulization every 6 (six) hours as needed for wheezing or shortness of breath. Dx: J44.9   ARIPiprazole 5 MG tablet Commonly known as:  ABILIFY Take 1 tablet (5 mg total) by mouth every morning.   escitalopram 20 MG tablet Commonly known as:  LEXAPRO Take 1 tablet (20 mg total) by mouth every morning.   fluticasone 110 MCG/ACT inhaler Commonly known as:  FLOVENT HFA Inhale 2 puffs into the lungs 2 (two) times daily.   lovastatin 20 MG tablet Commonly known as:  MEVACOR Take 1 tablet (20 mg total) by mouth at bedtime.   mometasone-formoterol  100-5 MCG/ACT Aero Commonly known as:  DULERA Inhale 2 puffs into the lungs 2 (two) times daily.   OXYGEN Inhale 3 L into the lungs.   predniSONE 10 MG tablet Commonly known as:  DELTASONE Take 4 tabs daily for 2 days, then 3 tabs daily for 3 days, then 2 tabs daily for 2 days, then 1 tab daily for 2 days, then stop. Start taking on:  05/27/2017 What changed:    medication strength  how much to take  how to take this  when to take this  additional instructions      Follow-up Information    [name], MD. Schedule an appointment as soon as possible for a visit in 1 week(s).   Specialty:  Pulmonary Disease Contact information: [address] [address] [phone]        [facility] Follow up on 06/06/2017.   Why:  hospital follow-up scheduled for 06/06/2017 at 10am.  To be seen with repeat labs (CBC & BMP).  Reassess hyperglycemia and need for medications. Contact information: [address] [place] [address] [phone]          Allergies  Allergen Reactions  . Codeine Nausea And Vomiting      Procedures/Studies: Dg Chest 2 View  Result Date: 05/20/2017 CLINICAL DATA:  Shortness of breath EXAM: CHEST - 2 VIEW COMPARISON:  05/13/2017 FINDINGS: Lungs are clear.  No pleural effusion or pneumothorax. The heart is normal in size. Mild degenerative changes of the visualized thoracolumbar spine. IMPRESSION: Normal chest radiographs. Electronically Signed   By: Julian Hy M.D.   On: 05/20/2017 22:44   Ct Head Wo Contrast  Result Date: 05/21/2017 CLINICAL DATA:  Headache and generalize weakness for 2 days. EXAM: CT HEAD WITHOUT CONTRAST TECHNIQUE: Contiguous axial images were obtained from the base of the skull through the vertex without intravenous contrast. COMPARISON:  12/04/2016 FINDINGS: Brain: No evidence of acute infarction, hemorrhage, hydrocephalus, extra-axial collection or mass lesion/mass effect.  Vascular: No hyperdense vessel or unexpected calcification. Skull: Normal. Negative for fracture or focal lesion. Sinuses/Orbits: No acute finding. Other: None. IMPRESSION: No acute intracranial abnormalities. Electronically Signed   By: Lucienne Capers M.D.   On: 05/21/2017 03:03   Ct Angio Chest Pe W Or Wo Contrast  Result Date: 05/21/2017 CLINICAL DATA:  Sternal pain for 2 days.  History of COPD. EXAM: CT ANGIOGRAPHY CHEST WITH CONTRAST TECHNIQUE: Multidetector CT imaging of the chest was performed using the standard protocol during bolus administration of intravenous contrast. Multiplanar CT image reconstructions and MIPs were obtained to evaluate the vascular anatomy. CONTRAST:  158mL ISOVUE-370 IOPAMIDOL (ISOVUE-370) INJECTION 76% COMPARISON:  None. FINDINGS: Cardiovascular: Good opacification of the central and segmental pulmonary arteries. No focal filling defects. No evidence of significant pulmonary embolus. Normal heart size. No pericardial effusion. Normal caliber thoracic aorta. No aortic dissection. Great vessel origins are patent. Coronary artery calcifications. Mediastinum/Nodes: No enlarged mediastinal, hilar, or axillary lymph nodes. Thyroid gland, trachea, and esophagus demonstrate no significant findings. Lungs/Pleura: Emphysematous changes in the lungs. Bronchial wall thickening consistent with chronic bronchitis. No airspace disease or consolidation. No pleural effusions. No pneumothorax. Airways are patent. Upper Abdomen: No acute process demonstrated in the visualized upper abdomen. Musculoskeletal: Old right third rib deformity. Degenerative changes in the spine. No destructive bone lesions. Review of the MIP images confirms the above findings. IMPRESSION: 1. No evidence of significant pulmonary embolus. 2. Emphysematous changes in the lungs. No evidence of active pulmonary disease. 3. Mild coronary artery calcifications. Emphysema (ICD10-J43.9). Electronically Signed   By: Lucienne Capers M.D.   On: 05/21/2017 03:08   Dg Chest Port 1 View  Result Date: 05/24/2017 CLINICAL DATA:  Central chest pain and shortness of breath for 3 days. EXAM: PORTABLE CHEST 1 VIEW COMPARISON:  Chest CT 05/21/2017, chest radiograph 05/20/2017 FINDINGS: Cardiomediastinal silhouette is normal. Mediastinal contours appear intact. There is no evidence of focal airspace consolidation, pleural effusion or pneumothorax. Emphysematous changes with hyperinflation of the lungs again seen. Osseous structures are without acute abnormality. Soft tissues are grossly normal. IMPRESSION: Emphysematous changes with hyperinflation of the lungs. No evidence of focal consolidation. Electronically Signed   By: Fidela Salisbury M.D.   On: 05/24/2017 17:20    Subjective: Patient states that he feels much better.  No chest pain or chest tightness reported.  Dyspnea improved and breathing almost back to baseline.  No wheezing, cough, fever or chills.  Denies dizziness or lightheadedness.  As per RN, no acute issues reported.  Discharge Exam:  Vitals:   05/26/17 0513 05/26/17 0805 05/26/17 1215 05/26/17 1357  BP: (!) 153/78   117/71  Pulse: 88 92  83  Resp: 18 16  18   Temp: 98.4 F (36.9 C)   97.9 F (36.6 C)  TempSrc:  Oral   Oral  SpO2: 97% 97% 98% 98%  Weight:      Height:        General exam: Pleasant middle-aged male, moderately built and nourished male lying propped up in bed without distress Respiratory system:  Slightly distant breath sounds but clear to auscultation except 1 or 2 rhonchi heard posteriorly.  No crackles.  No increased work of breathing.  Able to speak in full sentences. Cardiovascular system: S1 & S2 heard, RRR. No JVD, murmurs, rubs, gallops or clicks. No pedal edema.  Telemetry personally reviewed: Sinus rhythm. Gastrointestinal system: Abdomen is nondistended, soft and nontender. No organomegaly or masses felt. Normal bowel sounds heard.   Central nervous system: Alert and  oriented. No focal neurological deficits.   Extremities: Symmetric 5 x 5 power. Skin: No rashes, lesions or ulcers Psychiatry: Judgement and insight appear normal. Mood & affect appropriate.       The results of significant diagnostics from this hospitalization (including imaging, microbiology, ancillary and laboratory) are listed below for reference.     Microbiology: Recent Results (from the past 240 hour(s))  Culture, blood (routine x 2)     Status: None   Collection Time: 05/21/17  3:05 AM  Result Value Ref Range Status   Specimen Description BLOOD RIGHT ANTECUBITAL  Final   Special Requests   Final    BOTTLES DRAWN AEROBIC AND ANAEROBIC Blood Culture adequate volume   Culture   Final    NO GROWTH 5 DAYS Performed at [facility], [address], [place], [place] [address]    Report Status 05/26/2017 FINAL  Final  Culture, blood (routine x 2)     Status: None   Collection Time: 05/21/17  3:14 AM  Result Value Ref Range Status   Specimen Description BLOOD RIGHT FOREARM  Final   Special Requests   Final    BOTTLES DRAWN AEROBIC AND ANAEROBIC Blood Culture adequate volume   Culture   Final    NO GROWTH 5 DAYS Performed at [facility], [place] [address], [place], [place] [address]    Report Status 05/26/2017 FINAL  Final     Labs: CBC: Recent Labs  Lab 05/20/17 2157 05/21/17 0305  WBC 10.2 9.1  HGB 14.0 12.4*  HCT 42.3 38.6*  MCV 94.2 96.5  PLT 203 425   Basic Metabolic Panel: Recent Labs  Lab 05/20/17 2327 05/21/17 0305 05/22/17 0419  NA 138 136 136  K 4.6 4.2 4.6  CL 97* 92* 97*  CO2 35* 30 34*  GLUCOSE 159* 327* 218*  BUN 10 14 11   CREATININE 0.72 1.11 0.75  CALCIUM 8.0* 8.2* 8.0*  MG  --  2.6*  --    BNP (last 3 results) Recent Labs    12/12/16 1248 04/05/17 2241 05/23/17 1052  BNP 63.3 43.0 93.4   Cardiac Enzymes: Recent Labs  Lab 05/21/17 0757 05/21/17 1514 05/22/17 1807 05/23/17 0116 05/23/17 0711  TROPONINI <0.03  <0.03 <0.03 <0.03 <0.03   CBG: Recent Labs  Lab 05/25/17 1205 05/25/17 1740 05/25/17 2149 05/26/17 0817 05/26/17 1209  GLUCAP 206* 209* 374* 282* 268*       Time coordinating discharge: 35 minutes  SIGNED:  Vernell Leep, MD, FACP, FHM. [facility] Pager [phone]  If 7PM-7AM, please contact night-coverage [email or web address] Password [email or web address] 05/26/2017, 2:11 PM

## 2017-05-27 ENCOUNTER — Ambulatory Visit: Payer: Self-pay | Admitting: Internal Medicine

## 2017-05-28 ENCOUNTER — Encounter (HOSPITAL_COMMUNITY): Payer: Self-pay | Admitting: Emergency Medicine

## 2017-05-28 ENCOUNTER — Inpatient Hospital Stay (HOSPITAL_COMMUNITY)
Admission: EM | Admit: 2017-05-28 | Discharge: 2017-06-02 | DRG: 190 | Disposition: A | Discharge status: Home / Self Care | Payer: Medicaid Other | Attending: Internal Medicine | Admitting: Internal Medicine

## 2017-05-28 ENCOUNTER — Emergency Department (HOSPITAL_COMMUNITY): Payer: Medicaid Other

## 2017-05-28 DIAGNOSIS — E099 Drug or chemical induced diabetes mellitus without complications: Secondary | ICD-10-CM | POA: Diagnosis present

## 2017-05-28 DIAGNOSIS — J9622 Acute and chronic respiratory failure with hypercapnia: Secondary | ICD-10-CM | POA: Diagnosis present

## 2017-05-28 DIAGNOSIS — J9621 Acute and chronic respiratory failure with hypoxia: Secondary | ICD-10-CM | POA: Diagnosis present

## 2017-05-28 DIAGNOSIS — F319 Bipolar disorder, unspecified: Secondary | ICD-10-CM | POA: Diagnosis present

## 2017-05-28 DIAGNOSIS — I493 Ventricular premature depolarization: Secondary | ICD-10-CM | POA: Diagnosis present

## 2017-05-28 DIAGNOSIS — R739 Hyperglycemia, unspecified: Secondary | ICD-10-CM | POA: Diagnosis present

## 2017-05-28 DIAGNOSIS — T380X5A Adverse effect of glucocorticoids and synthetic analogues, initial encounter: Secondary | ICD-10-CM | POA: Diagnosis present

## 2017-05-28 DIAGNOSIS — F4321 Adjustment disorder with depressed mood: Secondary | ICD-10-CM | POA: Diagnosis present

## 2017-05-28 DIAGNOSIS — Z933 Colostomy status: Secondary | ICD-10-CM

## 2017-05-28 DIAGNOSIS — J441 Chronic obstructive pulmonary disease with (acute) exacerbation: Principal | ICD-10-CM | POA: Diagnosis present

## 2017-05-28 DIAGNOSIS — Z66 Do not resuscitate: Secondary | ICD-10-CM | POA: Diagnosis present

## 2017-05-28 DIAGNOSIS — R402413 Glasgow coma scale score 13-15, at hospital admission: Secondary | ICD-10-CM | POA: Diagnosis present

## 2017-05-28 DIAGNOSIS — E785 Hyperlipidemia, unspecified: Secondary | ICD-10-CM | POA: Diagnosis present

## 2017-05-28 DIAGNOSIS — Z7951 Long term (current) use of inhaled steroids: Secondary | ICD-10-CM

## 2017-05-28 DIAGNOSIS — R000 Tachycardia, unspecified: Secondary | ICD-10-CM | POA: Diagnosis present

## 2017-05-28 DIAGNOSIS — Z8679 Personal history of other diseases of the circulatory system: Secondary | ICD-10-CM

## 2017-05-28 DIAGNOSIS — I4581 Long QT syndrome: Secondary | ICD-10-CM | POA: Diagnosis present

## 2017-05-28 DIAGNOSIS — Z87891 Personal history of nicotine dependence: Secondary | ICD-10-CM

## 2017-05-28 DIAGNOSIS — E874 Mixed disorder of acid-base balance: Secondary | ICD-10-CM | POA: Diagnosis present

## 2017-05-28 DIAGNOSIS — Z9981 Dependence on supplemental oxygen: Secondary | ICD-10-CM

## 2017-05-28 DIAGNOSIS — E875 Hyperkalemia: Secondary | ICD-10-CM | POA: Diagnosis present

## 2017-05-28 DIAGNOSIS — Z79899 Other long term (current) drug therapy: Secondary | ICD-10-CM

## 2017-05-28 DIAGNOSIS — Z885 Allergy status to narcotic agent status: Secondary | ICD-10-CM

## 2017-05-28 HISTORY — DX: Pneumonia, unspecified organism: J18.9

## 2017-05-28 HISTORY — DX: Emphysema, unspecified: J43.9

## 2017-05-28 HISTORY — DX: Unspecified asthma, uncomplicated: J45.909

## 2017-05-28 HISTORY — DX: Dependence on supplemental oxygen: Z99.81

## 2017-05-28 LAB — I-STAT CHEM 8, ED
BUN: 14 mg/dL (ref 6–20)
Calcium, Ion: 1.08 mmol/L — ABNORMAL LOW (ref 1.15–1.40)
Chloride: 94 mmol/L — ABNORMAL LOW (ref 101–111)
Creatinine, Ser: 0.7 mg/dL (ref 0.61–1.24)
GLUCOSE: 254 mg/dL — AB (ref 65–99)
HEMATOCRIT: 42 % (ref 39.0–52.0)
HEMOGLOBIN: 14.3 g/dL (ref 13.0–17.0)
Potassium: 4.3 mmol/L (ref 3.5–5.1)
SODIUM: 138 mmol/L (ref 135–145)
TCO2: 39 mmol/L — ABNORMAL HIGH (ref 22–32)

## 2017-05-28 LAB — BASIC METABOLIC PANEL
ANION GAP: 6 (ref 5–15)
BUN: 10 mg/dL (ref 6–20)
CALCIUM: 8.3 mg/dL — AB (ref 8.9–10.3)
CO2: 36 mmol/L — AB (ref 22–32)
Chloride: 96 mmol/L — ABNORMAL LOW (ref 101–111)
Creatinine, Ser: 0.66 mg/dL (ref 0.61–1.24)
GLUCOSE: 258 mg/dL — AB (ref 65–99)
POTASSIUM: 4.4 mmol/L (ref 3.5–5.1)
Sodium: 138 mmol/L (ref 135–145)

## 2017-05-28 LAB — CBC WITH DIFFERENTIAL/PLATELET
BASOS ABS: 0 10*3/uL (ref 0.0–0.1)
BASOS PCT: 0 %
Eosinophils Absolute: 0 10*3/uL (ref 0.0–0.7)
Eosinophils Relative: 0 %
HEMATOCRIT: 44.1 % (ref 39.0–52.0)
Hemoglobin: 13.9 g/dL (ref 13.0–17.0)
LYMPHS PCT: 15 %
Lymphs Abs: 1.5 10*3/uL (ref 0.7–4.0)
MCH: 30.5 pg (ref 26.0–34.0)
MCHC: 31.5 g/dL (ref 30.0–36.0)
MCV: 96.7 fL (ref 78.0–100.0)
MONO ABS: 0.9 10*3/uL (ref 0.1–1.0)
Monocytes Relative: 9 %
NEUTROS ABS: 7.5 10*3/uL (ref 1.7–7.7)
NEUTROS PCT: 76 %
Platelets: 213 10*3/uL (ref 150–400)
RBC: 4.56 MIL/uL (ref 4.22–5.81)
RDW: 13.6 % (ref 11.5–15.5)
WBC: 9.9 10*3/uL (ref 4.0–10.5)

## 2017-05-28 LAB — I-STAT TROPONIN, ED: Troponin i, poc: 0 ng/mL (ref 0.00–0.08)

## 2017-05-28 LAB — BRAIN NATRIURETIC PEPTIDE: B Natriuretic Peptide: 28.9 pg/mL (ref 0.0–100.0)

## 2017-05-28 MED ORDER — IPRATROPIUM-ALBUTEROL 0.5-2.5 (3) MG/3ML IN SOLN: 3.0000 mL | RESPIRATORY_TRACT | Status: DC

## 2017-05-28 MED ORDER — ALBUTEROL (5 MG/ML) CONTINUOUS INHALATION SOLN
10.0000 mg/h | INHALATION_SOLUTION | RESPIRATORY_TRACT | Status: DC
Start: 2017-05-28 — End: 2017-05-29
  Administered 2017-05-28: 10 mg/h via RESPIRATORY_TRACT
  Filled 2017-05-28: qty 20

## 2017-05-28 NOTE — ED Triage Notes (Signed)
Pt presents from home with [facility] for increased SOB since 6pm tonight; EMS arrived found spO2 96% room air but no lung sounds; given neb at home with no change; CPAP started and patient given 2 more duonebs, 125mg  solumedrol, and 2g mag; pt began to have ex wheeze; states the same happened 1 wk ago

## 2017-05-28 NOTE — ED Notes (Signed)
Pt on bi-pap  Not hooked up to the 5 lead  Monitor/  Pt alert no distress at present  C/o bi-lateral leg pain

## 2017-05-28 NOTE — ED Provider Notes (Signed)
[facility] EMERGENCY DEPARTMENT Provider Note   CSN: [id number] Arrival date & time: 05/28/17  2114     History   Chief Complaint Chief Complaint  Patient presents with  . Respiratory Distress    HPI Taylor [name] is a 62 y.o. male.  HPI   Patient is a 62 year old male with a past medical history of COPD, CHF who comes in today brought by EMS with shortness of breath which patient states began this evening around 6 PM.  On arrival, EMS state patient with oxygen saturation of 96% with tachypnea, difficulty with expiration and obvious accessory muscle use.  Patient was given nebulizer in route without change and started on CPAP.  Patient then received 2 more duo nebs as well as 125 mg Solu-Medrol and 2 g of magnesium.  Following treatment, patient was noted to have expiratory wheeze.  Patient had a similar event approximately 1 week ago and was treated in the ED.  Patient denies any fevers chills nausea vomiting recent illness sick contacts, however history limited by patient's shortness of breath as he is unable to speak in full sentences.  Past Medical History:  Diagnosis Date  . Bipolar 1 disorder ([place]) 02/05/2012  . Cancer ([place]) 04/11/11   adenocarcinoma of colon, 7/19 nodes pos.FINISHED CHEMO/DR. SHERRILL  . COPD (chronic obstructive pulmonary disease) ([place])    SMOKER  . Full dentures   . Hemorrhoids   . Inguinal hernia    RIGHT- PAINFUL  . Prostate cancer ([place])    Gleason score = 7, supposed to have radiation therapy but he has not followed up  . Rib fractures    hx of    Patient Active Problem List   Diagnosis Date Noted  . SIRS (systemic inflammatory response syndrome) ([place]) 05/21/2017  . Tobacco abuse 05/13/2017  . HLD (hyperlipidemia) 05/13/2017  . Acute on chronic respiratory failure with hypoxia and hypercapnia ([place]) 04/06/2017  . Leukocytosis 04/06/2017  . Adjustment disorder with depressed mood 03/28/2017  . Adjustment disorder with  mixed disturbance of emotions and conduct   . Chronic respiratory failure with hypoxia ([place]) 03/24/2017  . Normocytic normochromic anemia 03/24/2017  . COPD with acute exacerbation ([place]) 10/22/2016  . Alcohol abuse 01/09/2013  . COPD exacerbation ([place]) 01/09/2013  . Chest pain 01/09/2013  . Smoker 12/16/2012  . Inguinal hernia unilateral, non-recurrent, right 05/01/2012  . Dyspepsia 02/05/2012  . Bipolar 1 disorder ([place]) 02/05/2012  . Hyperglycemia 02/04/2012  . COPD  GOLD III 02/03/2012  . Thrombocytopenia ([place]) 02/03/2012  . Depression   . 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 COLON SURGERY  04/11/11   Sigmoid colectomy  . COLOSTOMY REVISION  04/11/2011   Procedure: COLON RESECTION SIGMOID;  Surgeon: [name], MD;  Location: [facility];  Service: General;  Laterality: N/A;  low anterior colon resection   . INGUINAL HERNIA REPAIR Right 05/01/2012   Procedure: HERNIA REPAIR INGUINAL ADULT;  Surgeon: [name], MD;  Location: [facility];  Service: General;  Laterality: Right;  . INSERTION OF MESH Right 05/01/2012   Procedure: INSERTION OF MESH;  Surgeon: [name], MD;  Location: [facility];  Service: General;  Laterality: Right;  . PORT-A-CATH REMOVAL Left 05/01/2012   Procedure: REMOVAL Infusion Port;  Surgeon: [name], MD;  Location: [facility];  Service: General;  Laterality: Left;  . PORTACATH PLACEMENT    . PORTACATH PLACEMENT  05/02/2011   Procedure: INSERTION  PORT-A-CATH;  Surgeon: [name], MD;  Location: [facility];  Service: General;  Laterality: N/A;        Home Medications    Prior to Admission medications   Medication Sig Start Date End Date Taking? Authorizing Provider  albuterol (PROVENTIL) (2.5 MG/3ML) 0.083% nebulizer solution Take 3 mLs (2.5 mg total) by nebulization every 6 (six) hours as needed for wheezing or shortness of breath. Dx: J44.9 05/18/17 06/17/17  Nita Sells, MD  ARIPiprazole (ABILIFY)  5 MG tablet Take 1 tablet (5 mg total) by mouth every morning. 05/18/17   Nita Sells, MD  escitalopram (LEXAPRO) 20 MG tablet Take 1 tablet (20 mg total) by mouth every morning. 05/18/17   Nita Sells, MD  fluticasone (FLOVENT HFA) 110 MCG/ACT inhaler Inhale 2 puffs into the lungs 2 (two) times daily. 05/18/17 07/17/17  Nita Sells, MD  lovastatin (MEVACOR) 20 MG tablet Take 1 tablet (20 mg total) by mouth at bedtime. 05/18/17   Nita Sells, MD  mometasone-formoterol (DULERA) 100-5 MCG/ACT AERO Inhale 2 puffs into the lungs 2 (two) times daily. 05/18/17   Nita Sells, MD  OXYGEN Inhale 3 L into the lungs.     [provider]  predniSONE (DELTASONE) 10 MG tablet Take 4 tabs daily for 2 days, then 3 tabs daily for 3 days, then 2 tabs daily for 2 days, then 1 tab daily for 2 days, then stop. 05/27/17   Hongalgi, Lenis Dickinson, MD    Family History Family History  Problem Relation Age of Onset  . Heart disease Father   . Lung cancer Maternal Uncle        smoked    Social History Social History   Tobacco Use  . Smoking status: Former Smoker    Packs/day: 1.00    Years: 45.00    Pack years: 45.00    Types: Cigarettes    Last attempt to quit: 01/22/2016    Years since quitting: 1.3  . Smokeless tobacco: Former Network engineer Use Topics  . Alcohol use: No    Comment: h/o use in the past, no h/o heavy use  . Drug use: No     Allergies   Codeine   Review of Systems Review of Systems  Unable to perform ROS: Acuity of condition  Constitutional: Negative for chills and fever.  Respiratory: Positive for chest tightness and shortness of breath.   Cardiovascular: Negative for chest pain.  Gastrointestinal: Negative for abdominal pain.     Physical Exam Updated Vital Signs BP 127/79   Pulse 84   Temp (!) 97.5 F (36.4 C) (Temporal)   Resp (!) 26   SpO2 99%   Physical Exam  Constitutional: He is oriented to person, place, and time. He  appears well-developed and well-nourished. He appears distressed.  HENT:  Head: Normocephalic and atraumatic.  Eyes: Conjunctivae are normal.  Neck: Neck supple.  Cardiovascular: Normal rate and regular rhythm.  No murmur heard. Pulmonary/Chest: He is in respiratory distress. He has wheezes.  Decreased air movement globally with little to no air movement in lower lung fields.  Mild expiratory wheeze heard in upper lung fields, however overall air movement is extremely poor.  Abdominal: Soft. There is no tenderness.  Musculoskeletal: He exhibits no edema.  Neurological: He is alert and oriented to person, place, and time.  Skin: Skin is warm and dry.  Psychiatric: He has a normal mood and affect.  Nursing note and vitals reviewed.    ED Treatments / Results  Labs (all labs ordered are listed, but only abnormal results are displayed) Labs Reviewed  BASIC METABOLIC PANEL - Abnormal; Notable for the following components:      Result Value   Chloride 96 (*)    CO2 36 (*)    Glucose, Bld 258 (*)    Calcium 8.3 (*)    All other components within normal limits  I-STAT CHEM 8, ED - Abnormal; Notable for the following components:   Chloride 94 (*)    Glucose, Bld 254 (*)    Calcium, Ion 1.08 (*)    TCO2 39 (*)    All other components within normal limits  CBC WITH DIFFERENTIAL/PLATELET  BRAIN NATRIURETIC PEPTIDE  BLOOD GAS, VENOUS  I-STAT TROPONIN, ED    EKG EKG Interpretation  Date/Time:  Wednesday May 28 2017 21:17:29 EDT Ventricular Rate:  108 PR Interval:    QRS Duration: 119 QT Interval:  322 QTC Calculation: 432 R Axis:   40 Text Interpretation:  Sinus tachycardia Multiple ventricular premature complexes Biatrial enlargement Nonspecific intraventricular conduction delay Low voltage, precordial leads Consider anterior infarct Since last tracing rate faster Otherwise no significant change Confirmed by Deno Etienne [phone]) on 05/28/2017 10:10:58 PM   Radiology Dg Chest  Port 1 View  Result Date: 05/28/2017 CLINICAL DATA:  Shortness of breath.  COPD.  Former smoker. EXAM: PORTABLE CHEST 1 VIEW COMPARISON:  05/24/2017. FINDINGS: Normal cardiomediastinal silhouette. Hyperinflation without consolidation or edema. Chronic LEFT CP angle blunting. Mild basilar scarring. No pneumothorax. No acute osseous findings. Chronic rib fractures. IMPRESSION: COPD.  No active disease. Electronically Signed   By: Staci Righter M.D.   On: 05/28/2017 23:14    Procedures Procedures (including critical care time)  Medications Ordered in ED Medications  albuterol (PROVENTIL,VENTOLIN) solution continuous neb (10 mg/hr Nebulization New Bag/Given 05/28/17 2303)     Initial Impression / Assessment and Plan / ED Course  I have reviewed the triage vital signs and the nursing notes.  Pertinent labs & imaging results that were available during my care of the patient were reviewed by me and considered in my medical decision making (see chart for details).     Physical exam: Patient with faint expiratory wheezes in upper lung fields with decreased air movement, lower lung fields little to no air movement appreciated on auscultation.  Patient tachycardic but with normal heart sounds.  Patient with accessory muscle use on breathing with obvious prolonged expiratory.  Remainder of exam within normal limits.  Patient placed on continuous nebulizer with BiPAP.  Laboratory work-up largely within normal limits.  Normal BNP and troponin.  Awaiting results of blood gas.  Chest x-ray shows no acute cardiopulmonary abnormality.  Given patient's continued BiPAP and continuous nebulizer requirement, will admit patient to stepdown unit under hospitalist service.  Final Clinical Impressions(s) / ED Diagnoses   Final diagnoses:  None    ED Discharge Orders    None       Chapman Moss, MD 05/28/17 [place], [place], DO 05/28/17 2352

## 2017-05-29 ENCOUNTER — Encounter (HOSPITAL_COMMUNITY): Payer: Self-pay | Admitting: Family Medicine

## 2017-05-29 ENCOUNTER — Other Ambulatory Visit: Payer: Self-pay

## 2017-05-29 DIAGNOSIS — Z7951 Long term (current) use of inhaled steroids: Secondary | ICD-10-CM | POA: Diagnosis not present

## 2017-05-29 DIAGNOSIS — J441 Chronic obstructive pulmonary disease with (acute) exacerbation: Secondary | ICD-10-CM | POA: Diagnosis present

## 2017-05-29 DIAGNOSIS — T380X5A Adverse effect of glucocorticoids and synthetic analogues, initial encounter: Secondary | ICD-10-CM

## 2017-05-29 DIAGNOSIS — Z87891 Personal history of nicotine dependence: Secondary | ICD-10-CM | POA: Diagnosis not present

## 2017-05-29 DIAGNOSIS — I493 Ventricular premature depolarization: Secondary | ICD-10-CM | POA: Diagnosis present

## 2017-05-29 DIAGNOSIS — J9622 Acute and chronic respiratory failure with hypercapnia: Secondary | ICD-10-CM

## 2017-05-29 DIAGNOSIS — E785 Hyperlipidemia, unspecified: Secondary | ICD-10-CM | POA: Diagnosis present

## 2017-05-29 DIAGNOSIS — F319 Bipolar disorder, unspecified: Secondary | ICD-10-CM | POA: Diagnosis present

## 2017-05-29 DIAGNOSIS — J9621 Acute and chronic respiratory failure with hypoxia: Secondary | ICD-10-CM | POA: Diagnosis present

## 2017-05-29 DIAGNOSIS — Z9981 Dependence on supplemental oxygen: Secondary | ICD-10-CM | POA: Diagnosis not present

## 2017-05-29 DIAGNOSIS — E099 Drug or chemical induced diabetes mellitus without complications: Secondary | ICD-10-CM

## 2017-05-29 DIAGNOSIS — Z66 Do not resuscitate: Secondary | ICD-10-CM | POA: Diagnosis present

## 2017-05-29 DIAGNOSIS — E875 Hyperkalemia: Secondary | ICD-10-CM | POA: Diagnosis present

## 2017-05-29 DIAGNOSIS — R739 Hyperglycemia, unspecified: Secondary | ICD-10-CM | POA: Diagnosis present

## 2017-05-29 DIAGNOSIS — Z8679 Personal history of other diseases of the circulatory system: Secondary | ICD-10-CM | POA: Diagnosis not present

## 2017-05-29 DIAGNOSIS — R402413 Glasgow coma scale score 13-15, at hospital admission: Secondary | ICD-10-CM | POA: Diagnosis present

## 2017-05-29 DIAGNOSIS — Z79899 Other long term (current) drug therapy: Secondary | ICD-10-CM | POA: Diagnosis not present

## 2017-05-29 DIAGNOSIS — E874 Mixed disorder of acid-base balance: Secondary | ICD-10-CM | POA: Diagnosis present

## 2017-05-29 DIAGNOSIS — I4581 Long QT syndrome: Secondary | ICD-10-CM | POA: Diagnosis present

## 2017-05-29 DIAGNOSIS — Z885 Allergy status to narcotic agent status: Secondary | ICD-10-CM | POA: Diagnosis not present

## 2017-05-29 DIAGNOSIS — R000 Tachycardia, unspecified: Secondary | ICD-10-CM | POA: Diagnosis present

## 2017-05-29 DIAGNOSIS — Z933 Colostomy status: Secondary | ICD-10-CM | POA: Diagnosis not present

## 2017-05-29 DIAGNOSIS — F4321 Adjustment disorder with depressed mood: Secondary | ICD-10-CM | POA: Diagnosis not present

## 2017-05-29 LAB — I-STAT VENOUS BLOOD GAS, ED
Acid-Base Excess: 8 mmol/L — ABNORMAL HIGH (ref 0.0–2.0)
BICARBONATE: 36.7 mmol/L — AB (ref 20.0–28.0)
O2 Saturation: 90 %
PCO2 VEN: 75.2 mmHg — AB (ref 44.0–60.0)
PO2 VEN: 68 mmHg — AB (ref 32.0–45.0)
Patient temperature: 37
TCO2: 39 mmol/L — AB (ref 22–32)
pH, Ven: 7.297 (ref 7.250–7.430)

## 2017-05-29 LAB — CBG MONITORING, ED
Glucose-Capillary: 209 mg/dL — ABNORMAL HIGH (ref 65–99)
Glucose-Capillary: 276 mg/dL — ABNORMAL HIGH (ref 65–99)

## 2017-05-29 LAB — BASIC METABOLIC PANEL
Anion gap: 8 (ref 5–15)
BUN: 13 mg/dL (ref 6–20)
CALCIUM: 8.1 mg/dL — AB (ref 8.9–10.3)
CO2: 34 mmol/L — AB (ref 22–32)
CREATININE: 0.69 mg/dL (ref 0.61–1.24)
Chloride: 98 mmol/L — ABNORMAL LOW (ref 101–111)
GFR calc non Af Amer: >60 mL/min (ref >60)
Glucose, Bld: 260 mg/dL — ABNORMAL HIGH (ref 65–99)
Potassium: 4.6 mmol/L (ref 3.5–5.1)
SODIUM: 140 mmol/L (ref 135–145)

## 2017-05-29 LAB — GLUCOSE, CAPILLARY
Glucose-Capillary: 272 mg/dL — ABNORMAL HIGH (ref 65–99)
Glucose-Capillary: 120 mg/dL — ABNORMAL HIGH (ref 65–99)

## 2017-05-29 MED ORDER — BISACODYL 5 MG PO TBEC: 5.0000 mg | DELAYED_RELEASE_TABLET | Freq: Every day | ORAL | Status: DC | PRN

## 2017-05-29 MED ORDER — ALBUTEROL SULFATE (2.5 MG/3ML) 0.083% IN NEBU
2.5000 mg | INHALATION_SOLUTION | RESPIRATORY_TRACT | Status: DC | PRN
  Administered 2017-05-29: 2.5 mg via RESPIRATORY_TRACT
  Filled 2017-05-29: qty 3

## 2017-05-29 MED ORDER — METHYLPREDNISOLONE SODIUM SUCC 40 MG IJ SOLR
40.0000 mg | Freq: Two times a day (BID) | INTRAMUSCULAR | Status: DC
  Administered 2017-05-30 – 2017-06-01 (×6): 40 mg via INTRAVENOUS
  Filled 2017-05-29 (×6): qty 1

## 2017-05-29 MED ORDER — IPRATROPIUM-ALBUTEROL 0.5-2.5 (3) MG/3ML IN SOLN
3.0000 mL | Freq: Four times a day (QID) | RESPIRATORY_TRACT | Status: DC
  Administered 2017-05-29: 3 mL via RESPIRATORY_TRACT
  Filled 2017-05-29: qty 3

## 2017-05-29 MED ORDER — SODIUM CHLORIDE 0.9% FLUSH
3.0000 mL | Freq: Two times a day (BID) | INTRAVENOUS | Status: DC
  Administered 2017-05-29 – 2017-06-02 (×10): 3 mL via INTRAVENOUS

## 2017-05-29 MED ORDER — MOMETASONE FURO-FORMOTEROL FUM 100-5 MCG/ACT IN AERO
2.0000 | INHALATION_SPRAY | Freq: Two times a day (BID) | RESPIRATORY_TRACT | Status: DC
  Administered 2017-05-29 – 2017-06-02 (×10): 2 via RESPIRATORY_TRACT
  Filled 2017-05-29: qty 8.8

## 2017-05-29 MED ORDER — SENNOSIDES-DOCUSATE SODIUM 8.6-50 MG PO TABS: 1.0000 | ORAL_TABLET | Freq: Every evening | ORAL | Status: DC | PRN

## 2017-05-29 MED ORDER — METHYLPREDNISOLONE SODIUM SUCC 125 MG IJ SOLR
60.0000 mg | Freq: Four times a day (QID) | INTRAMUSCULAR | Status: DC
  Administered 2017-05-29 (×2): 60 mg via INTRAVENOUS
  Filled 2017-05-29 (×2): qty 2

## 2017-05-29 MED ORDER — ONDANSETRON HCL 4 MG/2ML IJ SOLN: 4.0000 mg | Freq: Four times a day (QID) | INTRAMUSCULAR | Status: DC | PRN

## 2017-05-29 MED ORDER — ARIPIPRAZOLE 5 MG PO TABS
5.0000 mg | ORAL_TABLET | Freq: Every day | ORAL | Status: DC
  Administered 2017-05-29 – 2017-06-02 (×5): 5 mg via ORAL
  Filled 2017-05-29 (×5): qty 1

## 2017-05-29 MED ORDER — SODIUM CHLORIDE 0.9% FLUSH
3.0000 mL | Freq: Two times a day (BID) | INTRAVENOUS | Status: DC
  Administered 2017-05-29 – 2017-06-02 (×5): 3 mL via INTRAVENOUS

## 2017-05-29 MED ORDER — ESCITALOPRAM OXALATE 20 MG PO TABS
20.0000 mg | ORAL_TABLET | Freq: Every day | ORAL | Status: DC
  Administered 2017-05-29 – 2017-06-02 (×5): 20 mg via ORAL
  Filled 2017-05-29 (×4): qty 1
  Filled 2017-05-29: qty 2

## 2017-05-29 MED ORDER — KETOROLAC TROMETHAMINE 30 MG/ML IJ SOLN: 30.0000 mg | Freq: Four times a day (QID) | INTRAMUSCULAR | Status: DC | PRN

## 2017-05-29 MED ORDER — SODIUM CHLORIDE 3 % IN NEBU
4.0000 mL | INHALATION_SOLUTION | Freq: Two times a day (BID) | RESPIRATORY_TRACT | Status: AC
  Administered 2017-05-29 – 2017-06-01 (×6): 4 mL via RESPIRATORY_TRACT
  Filled 2017-05-29 (×7): qty 4

## 2017-05-29 MED ORDER — IPRATROPIUM-ALBUTEROL 0.5-2.5 (3) MG/3ML IN SOLN
3.0000 mL | RESPIRATORY_TRACT | Status: DC | PRN
  Administered 2017-06-01: 3 mL via RESPIRATORY_TRACT
  Filled 2017-05-29: qty 3

## 2017-05-29 MED ORDER — SODIUM CHLORIDE 0.9 % IV SOLN: 250.0000 mL | INTRAVENOUS | Status: DC | PRN

## 2017-05-29 MED ORDER — HYDROCODONE-ACETAMINOPHEN 5-325 MG PO TABS
1.0000 | ORAL_TABLET | ORAL | Status: DC | PRN
  Administered 2017-05-29: 1 via ORAL
  Administered 2017-05-29: 2 via ORAL
  Administered 2017-05-30 – 2017-05-31 (×3): 1 via ORAL
  Filled 2017-05-29 (×4): qty 1
  Filled 2017-05-29: qty 2

## 2017-05-29 MED ORDER — ALPRAZOLAM 0.25 MG PO TABS
0.2500 mg | ORAL_TABLET | Freq: Two times a day (BID) | ORAL | Status: DC | PRN
  Administered 2017-05-30 – 2017-06-01 (×3): 0.25 mg via ORAL
  Filled 2017-05-29 (×3): qty 1

## 2017-05-29 MED ORDER — ACETAMINOPHEN 650 MG RE SUPP: 650.0000 mg | Freq: Four times a day (QID) | RECTAL | Status: DC | PRN

## 2017-05-29 MED ORDER — ACETAMINOPHEN 325 MG PO TABS
650.0000 mg | ORAL_TABLET | Freq: Four times a day (QID) | ORAL | Status: DC | PRN
  Administered 2017-06-01: 650 mg via ORAL
  Filled 2017-05-29: qty 2

## 2017-05-29 MED ORDER — SODIUM CHLORIDE 0.9% FLUSH: 3.0000 mL | INTRAVENOUS | Status: DC | PRN

## 2017-05-29 MED ORDER — ONDANSETRON HCL 4 MG PO TABS: 4.0000 mg | ORAL_TABLET | Freq: Four times a day (QID) | ORAL | Status: DC | PRN

## 2017-05-29 MED ORDER — INSULIN ASPART 100 UNIT/ML ~~LOC~~ SOLN
0.0000 [IU] | Freq: Three times a day (TID) | SUBCUTANEOUS | Status: DC
Start: 2017-05-29 — End: 2017-06-02
  Administered 2017-05-29 (×2): 5 [IU] via SUBCUTANEOUS
  Administered 2017-05-30 (×2): 3 [IU] via SUBCUTANEOUS
  Administered 2017-05-31: 7 [IU] via SUBCUTANEOUS
  Administered 2017-05-31: 8 [IU] via SUBCUTANEOUS
  Administered 2017-06-01: 3 [IU] via SUBCUTANEOUS
  Administered 2017-06-01: 7 [IU] via SUBCUTANEOUS
  Administered 2017-06-02: 1 [IU] via SUBCUTANEOUS
  Filled 2017-05-29: qty 1

## 2017-05-29 MED ORDER — INSULIN ASPART 100 UNIT/ML ~~LOC~~ SOLN
0.0000 [IU] | Freq: Every day | SUBCUTANEOUS | Status: DC
Start: 2017-05-29 — End: 2017-06-02
  Administered 2017-05-29: 2 [IU] via SUBCUTANEOUS
  Administered 2017-05-30: 4 [IU] via SUBCUTANEOUS
  Administered 2017-05-31: 2 [IU] via SUBCUTANEOUS
  Administered 2017-06-01: 3 [IU] via SUBCUTANEOUS
  Filled 2017-05-29 (×2): qty 1

## 2017-05-29 MED ORDER — SODIUM CHLORIDE 0.9 % IV SOLN
500.0000 mg | INTRAVENOUS | Status: DC
  Administered 2017-05-29 – 2017-06-01 (×4): 500 mg via INTRAVENOUS
  Filled 2017-05-29 (×4): qty 500

## 2017-05-29 MED ORDER — PRAVASTATIN SODIUM 20 MG PO TABS
20.0000 mg | ORAL_TABLET | Freq: Every day | ORAL | Status: DC
  Administered 2017-05-29 – 2017-06-01 (×4): 20 mg via ORAL
  Filled 2017-05-29 (×4): qty 1

## 2017-05-29 MED ORDER — ENOXAPARIN SODIUM 40 MG/0.4ML ~~LOC~~ SOLN
40.0000 mg | SUBCUTANEOUS | Status: DC
  Administered 2017-05-29 – 2017-06-01 (×4): 40 mg via SUBCUTANEOUS
  Filled 2017-05-29 (×6): qty 0.4

## 2017-05-29 NOTE — Progress Notes (Signed)
PROGRESS NOTE    Taylor [name]  KTG:[id number] DOB: 05/31/1955 DOA: 05/28/2017 PCP: [facility]    Brief Narrative:  62 year old male who presented with dyspnea.  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  He complained of 2-day history of worsening dyspnea, severe in intensity, and the emergency department he was found in respiratory distress, respiratory rate of 26, heart rate 94, blood pressure 133/89, oxygen saturation 98% on submental oxygen.  He was tachypneic, positive use of accessory muscles, moist mucous membranes, lungs with significant decreased breath sounds bilaterally with positive wheezing, heart S1-S2 present, rhythmic, abdomen soft nontender, no lower extremity edema.  138, potassium 4.4, chloride 96, bicarb 36, glucose 258, BUN 10, creatinine 0.66, white count 9.9, hemoglobin 13.9, hematocrit 44.1, platelets 213.  X-ray with positive hyperinflation no infiltrates.  EKG, sinus tachycardia, left axis deviation, poor R wave progression, prolonged QRS.   Patient was admitted to the hospital with the working diagnosis of acute hypoxic respiratory failure due to COPD exacerbation.    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 COPD exacerbation. Patient has been liberated from non invasive mechanical ventilation, down graded to telemetry from step down unit, will continue oxymetry monitoring and supplemental 02 per [place], to target 02 saturation greater than 88%, will continue aggressive bronchodilator therapy and systemic steroids. 5 day course of Azithromycin, will change  to po). Continue dulera. Will add ipratropium, hypertonic saline and flutter valve to respiratory regimen. Out of bed as tolerated and physical therapy evaluation.   2. Steroid induced hyperglycemia. Will continue insulin sliding scale for glucose cover and monitoring, will plan for short course of steroids.   3. Dyslipidemia. Continue pravastatin.   4. Depression. Continue ariprazole and escitalopram.    DVT prophylaxis:enoxaparin   Code Status: dnr Family Communication: no family at the bedside  Disposition Plan: home when clinically improved   Consultants:     Procedures:     Antimicrobials:       Subjective: Patient is feeling better than last night but still not close to his baseline, at home on 3 LPM of supplemental 02 per [place]. No cough or chest pain.   Objective: Vitals:   05/29/17 0900 05/29/17 0930 05/29/17 1000 05/29/17 1233  BP: 133/73 (!) 150/73 132/83 124/68  Pulse: 79 80 82 90  Resp: (!) 23 (!) 27 (!) 23 (!) 22  Temp:      TempSrc:      SpO2: 98% 98% 99% 99%    Intake/Output Summary (Last 24 hours) at 05/29/2017 1252 Last data filed at 05/29/2017 0725 Gross per 24 hour  Intake 250 ml  Output 350 ml  Net -100 ml   There were no vitals filed for this visit.  Examination:   General: deconditined Neurology: Awake and alert, non focal  E ENT: no pallor, no icterus, oral mucosa moist/ no accessory muscle use.  Cardiovascular: No JVD. S1-S2 present, rhythmic, no gallops, rubs, or murmurs. No lower extremity edema. Pulmonary: decreased breath sounds bilaterally, poor air movement, scattered wheezing, but no rhonchi or rales. Gastrointestinal. Abdomen with no organomegaly, non tender, no rebound or guarding Skin. No  rashes Musculoskeletal: no joint deformities     Data Reviewed: I have personally reviewed following labs and imaging studies  CBC: Recent Labs  Lab 05/28/17 2217 05/28/17 2218  WBC 9.9  --   NEUTROABS 7.5  --   HGB 13.9 14.3  HCT  44.1 42.0  MCV 96.7  --   PLT 213  --    Basic Metabolic Panel: Recent Labs  Lab 05/28/17 2217 05/28/17 2218 05/29/17 0308  NA 138 138 140  K 4.4 4.3 4.6  CL 96* 94* 98*  CO2 36*  --  34*  GLUCOSE 258* 254* 260*  BUN 10 14 13   CREATININE 0.66 0.70 0.69  CALCIUM 8.3*  --  8.1*   GFR: Estimated Creatinine Clearance: 102 mL/min (by C-G formula based on SCr of 0.69 mg/dL). Liver Function Tests: No results for input(s): AST, ALT, ALKPHOS, BILITOT, PROT, ALBUMIN in the last 168 hours. No results for input(s): LIPASE, AMYLASE in the last 168 hours. No results for input(s): AMMONIA in the last 168 hours. Coagulation Profile: No results for input(s): INR, PROTIME in the last 168 hours. Cardiac Enzymes: Recent Labs  Lab 05/22/17 1807 05/23/17 0116 05/23/17 0711  TROPONINI <0.03 <0.03 <0.03   BNP (last 3 results) No results for input(s): PROBNP in the last 8760 hours. HbA1C: No results for input(s): HGBA1C in the last 72 hours. CBG: Recent Labs  Lab 05/26/17 0817 05/26/17 1209 05/26/17 1654 05/29/17 0120 05/29/17 0717  GLUCAP 282* 268* 207* 209* 276*   Lipid Profile: No results for input(s): CHOL, HDL, LDLCALC, TRIG, CHOLHDL, LDLDIRECT in the last 72 hours. Thyroid Function Tests: No results for input(s): TSH, T4TOTAL, FREET4, T3FREE, THYROIDAB in the last 72 hours. Anemia Panel: No results for input(s): VITAMINB12, FOLATE, FERRITIN, TIBC, IRON, RETICCTPCT in the last 72 hours.    Radiology Studies: I have reviewed all of the imaging during this hospital visit personally     Scheduled Meds: . ARIPiprazole  5 mg Oral Daily  . enoxaparin (LOVENOX) injection  40 mg Subcutaneous Q24H  . escitalopram  20 mg Oral Daily  . insulin aspart  0-5 Units Subcutaneous QHS  . insulin aspart  0-9 Units Subcutaneous TID WC  . methylPREDNISolone (SOLU-MEDROL) injection  60 mg Intravenous Q6H  . mometasone-formoterol  2 puff Inhalation BID  . pravastatin  20 mg Oral q1800    . sodium chloride flush  3 mL Intravenous Q12H  . sodium chloride flush  3 mL Intravenous Q12H   Continuous Infusions: . sodium chloride    . albuterol Stopped (05/29/17 0356)  . azithromycin Stopped (05/29/17 0354)     LOS: 0 days        Taylor [name] Taylor Apley, MD [facility] Pager [phone]

## 2017-05-29 NOTE — ED Notes (Signed)
Pt moved to a regular  Hospital bed for comfort  hes been off bi-pap now for over an hour sats in the high 90s.  The pt is very jittery

## 2017-05-29 NOTE — Progress Notes (Signed)
Inpatient Diabetes Program Recommendations  AACE/ADA: New Consensus Statement on Inpatient Glycemic Control (2015)  Target Ranges:  Prepandial:   less than 140 mg/dL      Peak postprandial:   less than 180 mg/dL (1-2 hours)      Critically ill patients:  140 - 180 mg/dL   Lab Results  Component Value Date   GLUCAP 276 (H) 05/29/2017   HGBA1C 6.5 (H) 04/29/2017    Review of Glycemic Control Results for HATCHER, FRONING (MRN [id number]) as of 05/29/2017 11:49  Ref. Range 05/26/2017 16:54 05/29/2017 01:20 05/29/2017 07:17  Glucose-Capillary Latest Ref Range: 65 - 99 mg/dL 207 (H) 209 (H) 276 (H)    Admit with:COPD NoHistoryof DM (6.5 on last admission)  Current Insulin Orders:Novolog Sensitive Correction Scale/ SSI (0-9 units) TID AC   MD- Note patient currently receiving Solumedrol 60 mg Q6 hours. Steroids likely worsening glucose control.  Please consider the following in-hospital insulin regimen while patient getting steroids: -Start Lantus 10 units daily (0.15 units/kg dosing based on weight of 75 kg) -Start Novolog Meal Coverage: Novolog 4 units TID with meals(hold if pt eats <50% of meal)  Will likely need to taper insulin quickly as steroids are reduced.  Thanks, Bronson Curb, MSN, RNC-OB Diabetes Coordinator [phone] (8a-5p)

## 2017-05-29 NOTE — ED Notes (Signed)
meds not verified by pharmacy yet

## 2017-05-29 NOTE — ED Notes (Signed)
CBG 209, RN aware

## 2017-05-29 NOTE — ED Notes (Signed)
Pt given turkey sandwich and coke 

## 2017-05-29 NOTE — ED Notes (Signed)
Patient sleeping no complaints.

## 2017-05-29 NOTE — H&P (Signed)
History and Physical    Taylor [name] HEN:[id number] DOB: November 26, 1955 DOA: 05/28/2017  PCP: [facility]   Patient coming from: Home  Chief Complaint: SOB   HPI: Taylor [name] is a 62 y.o. 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  Patient was discharged from the hospital on 05/26/2017 after a one-week admission for COPD exacerbation.  He has been taking prednisone as directed since time of discharge, but reports insidious worsening in his symptoms over the past 2 days.  He was acutely dyspneic this evening and called EMS.  He was found to be in acute distress on EMS arrival, placed on CPAP, and treated with 125 mg of IV Solu-Medrol, 2 g IV magnesium, and nebs prior to arrival in the ED.  Patient denies chest pain, leg swelling, fevers, or chills.  ED Course: Upon arrival to the ED, patient is found to be afebrile, saturating adequately on BiPAP, tachycardic, and with stable blood pressure.  EKG features a sinus tachycardia with rate 108, PVCs, and nonspecific IVCD.  Chest x-ray features chronic COPD changes, but no acute findings.  Chemistry panel is notable for glucose 258 and bicarbonate 36.  CBC is unremarkable, troponin undetectable, and BNP normal.  Patient was kept on BiPAP in the emergency department and given 10 mg continuous albuterol treatment.  Patient reports some improvement in the ED and will be admitted for ongoing evaluation and management of acute exacerbation in COPD.  Review of Systems:  All other systems reviewed and apart from HPI, are negative.  Past Medical History:  Diagnosis Date  . Bipolar 1 disorder ([place]) 02/05/2012  . 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 COPD (chronic obstructive  pulmonary disease) ([place])    SMOKER  . Full dentures   . Hemorrhoids   . Inguinal hernia    RIGHT- PAINFUL  . Prostate cancer ([place])    Gleason score = 7, supposed to have radiation therapy but he has not followed up  . Rib fractures    hx of    Past Surgical History:  Procedure Laterality Date  . COLON SURGERY  04/11/11   Sigmoid colectomy  . COLOSTOMY REVISION  04/11/2011   Procedure: COLON RESECTION SIGMOID;  Surgeon: [name], MD;  Location: [facility];  Service: General;  Laterality: N/A;  low anterior colon resection   . INGUINAL HERNIA REPAIR Right 05/01/2012   Procedure: HERNIA REPAIR INGUINAL ADULT;  Surgeon: [name], MD;  Location: [facility];  Service: General;  Laterality: Right;  . INSERTION OF MESH Right 05/01/2012   Procedure: INSERTION OF MESH;  Surgeon: [name], MD;  Location: [facility];  Service: General;  Laterality: Right;  . PORT-A-CATH REMOVAL Left 05/01/2012   Procedure: REMOVAL Infusion Port;  Surgeon: [name], MD;  Location: [facility];  Service: General;  Laterality: Left;  . PORTACATH PLACEMENT    . PORTACATH PLACEMENT  05/02/2011   Procedure: INSERTION PORT-A-CATH;  Surgeon: [name], MD;  Location: [facility];  Service: General;  Laterality: N/A;     reports that he quit smoking about 16 months ago. His smoking use included cigarettes. He has a 45.00 pack-year smoking history. He has quit using smokeless tobacco. He reports that he does not drink alcohol or use drugs.  Allergies  Allergen Reactions  . Codeine Nausea And Vomiting    Family History  Problem Relation Age of Onset  . Heart disease Father   . Lung cancer Maternal Uncle        smoked     Prior to Admission medications   Medication Sig Start Date End Date Taking? Authorizing Provider  albuterol (PROVENTIL) (2.5 MG/3ML) 0.083% nebulizer solution Take 3 mLs (2.5 mg total) by nebulization every 6 (six) hours as needed for wheezing or shortness of breath. Dx: J44.9 05/18/17 06/17/17   Nita Sells, MD  ARIPiprazole (ABILIFY) 5 MG tablet Take 1 tablet (5 mg total) by mouth every morning. 05/18/17   Nita Sells, MD  escitalopram (LEXAPRO) 20 MG tablet Take 1 tablet (20 mg total) by mouth every morning. 05/18/17   Nita Sells, MD  fluticasone (FLOVENT HFA) 110 MCG/ACT inhaler Inhale 2 puffs into the lungs 2 (two) times daily. 05/18/17 07/17/17  Nita Sells, MD  lovastatin (MEVACOR) 20 MG tablet Take 1 tablet (20 mg total) by mouth at bedtime. 05/18/17   Nita Sells, MD  mometasone-formoterol (DULERA) 100-5 MCG/ACT AERO Inhale 2 puffs into the lungs 2 (two) times daily. 05/18/17   Nita Sells, MD  OXYGEN Inhale 3 L into the lungs.     [provider]  predniSONE (DELTASONE) 10 MG tablet Take 4 tabs daily for 2 days, then 3 tabs daily for 3 days, then 2 tabs daily for 2 days, then 1 tab daily for 2 days, then stop. 05/27/17   Modena Jansky, MD    Physical Exam: Vitals:   05/28/17 2230 05/28/17 2245 05/28/17 2300 05/28/17 2330  BP: 127/82 (!) 133/111 119/89 127/79  Pulse: 88 94 90 84  Resp:   (!) 26 (!) 26  Temp:      TempSrc:      SpO2: 100% 98% 100% 99%      Constitutional: Tachypneic and using accessory muscles, no pallor or diaphoresis Eyes: PERTLA, lids and conjunctivae normal ENMT: Mucous membranes are moist. Posterior pharynx clear of any exudate or lesions.   Neck: normal, supple, no masses, no thyromegaly Respiratory: Markedly diminished breath sounds bilaterally, accessory muscle recruitment, occasional wheeze, no pallor or cyanosis.  Cardiovascular: S1 & S2 heard, regular rate and rhythm. No extremity edema. No significant JVD. Abdomen: No distension, no tenderness, soft. Bowel sounds normal.  Musculoskeletal: no clubbing / cyanosis. No joint deformity upper and lower extremities.   Skin: no significant rashes, lesions, ulcers. Warm, dry, well-perfused. Neurologic: CN 2-12 grossly intact. Sensation  intact. Strength 5/5 in all 4 limbs.  Psychiatric: Alert and oriented x 3. Pleasant and cooperative.     Labs on Admission: I have personally reviewed following labs and imaging studies  CBC: Recent Labs  Lab 05/28/17 2217 05/28/17 2218  WBC 9.9  --   NEUTROABS 7.5  --   HGB 13.9 14.3  HCT 44.1 42.0  MCV 96.7  --   PLT 213  --    Basic Metabolic Panel: Recent Labs  Lab 05/22/17 0419 05/28/17 2217 05/28/17 2218  NA 136 138 138  K 4.6 4.4 4.3  CL 97* 96* 94*  CO2 34* 36*  --   GLUCOSE 218* 258* 254*  BUN 11 10 14   CREATININE 0.75 0.66 0.70  CALCIUM 8.0* 8.3*  --    GFR: Estimated Creatinine Clearance: 102 mL/min (by C-G formula based on SCr of 0.7 mg/dL). Liver Function Tests: No results for input(s): AST, ALT, ALKPHOS, BILITOT, PROT, ALBUMIN in the last  168 hours. No results for input(s): LIPASE, AMYLASE in the last 168 hours. No results for input(s): AMMONIA in the last 168 hours. Coagulation Profile: No results for input(s): INR, PROTIME in the last 168 hours. Cardiac Enzymes: Recent Labs  Lab 05/22/17 1807 05/23/17 0116 05/23/17 0711  TROPONINI <0.03 <0.03 <0.03   BNP (last 3 results) No results for input(s): PROBNP in the last 8760 hours. HbA1C: No results for input(s): HGBA1C in the last 72 hours. CBG: Recent Labs  Lab 05/25/17 1740 05/25/17 2149 05/26/17 0817 05/26/17 1209 05/26/17 1654  GLUCAP 209* 374* 282* 268* 207*   Lipid Profile: No results for input(s): CHOL, HDL, LDLCALC, TRIG, CHOLHDL, LDLDIRECT in the last 72 hours. Thyroid Function Tests: No results for input(s): TSH, T4TOTAL, FREET4, T3FREE, THYROIDAB in the last 72 hours. Anemia Panel: No results for input(s): VITAMINB12, FOLATE, FERRITIN, TIBC, IRON, RETICCTPCT in the last 72 hours. Urine analysis:    Component Value Date/Time   COLORURINE YELLOW 04/18/2017 0809   APPEARANCEUR CLEAR 04/18/2017 0809   LABSPEC 1.023 04/18/2017 0809   PHURINE 7.0 04/18/2017 0809   GLUCOSEU  >=500 (A) 04/18/2017 0809   HGBUR NEGATIVE 04/18/2017 0809   BILIRUBINUR NEGATIVE 04/18/2017 0809   KETONESUR 5 (A) 04/18/2017 0809   PROTEINUR NEGATIVE 04/18/2017 0809   UROBILINOGEN 1.0 01/09/2013 1843   NITRITE NEGATIVE 04/18/2017 0809   LEUKOCYTESUR NEGATIVE 04/18/2017 0809   Sepsis Labs: @LABRCNTIP (procalcitonin:4,lacticidven:4) ) Recent Results (from the past 240 hour(s))  Culture, blood (routine x 2)     Status: None   Collection Time: 05/21/17  3:05 AM  Result Value Ref Range Status   Specimen Description BLOOD RIGHT ANTECUBITAL  Final   Special Requests   Final    BOTTLES DRAWN AEROBIC AND ANAEROBIC Blood Culture adequate volume   Culture   Final    NO GROWTH 5 DAYS Performed at [facility], [place] [address] [place], [place] [address]    Report Status 05/26/2017 FINAL  Final  Culture, blood (routine x 2)     Status: None   Collection Time: 05/21/17  3:14 AM  Result Value Ref Range Status   Specimen Description BLOOD RIGHT FOREARM  Final   Special Requests   Final    BOTTLES DRAWN AEROBIC AND ANAEROBIC Blood Culture adequate volume   Culture   Final    NO GROWTH 5 DAYS Performed at [facility], [place] [address], [place], [place] [address]    Report Status 05/26/2017 FINAL  Final     Radiological Exams on Admission: Dg Chest Port 1 View  Result Date: 05/28/2017 CLINICAL DATA:  Shortness of breath.  COPD.  Former smoker. EXAM: PORTABLE CHEST 1 VIEW COMPARISON:  05/24/2017. FINDINGS: Normal cardiomediastinal silhouette. Hyperinflation without consolidation or edema. Chronic LEFT CP angle blunting. Mild basilar scarring. No pneumothorax. No acute osseous findings. Chronic rib fractures. IMPRESSION: COPD.  No active disease. Electronically Signed   By: Staci Righter M.D.   On: 05/28/2017 23:14    EKG: Independently reviewed. Sinus tachycardia (rate 108), PVC's, non-specific IVCD.   Assessment/Plan  1. COPD with acute exacerbation; acute on chronic  hypercarbic respiratory failure  - Presents with SOB, worsening insidiously since hospital discharge on 05/26/17  - In acute distress with EMS, started on CPAP and treated with 125 mg IV Solu-Medrol, 2 g IV magnesium, and nebs prior to arrival  - Transitioned to BiPAP in ED and given 10 mg albuterol neb  - CXR clear, no fever or leukocytosis  -  Check sputum culture, continue systemic steroid, continue ICS/LABA and prn albuterol nebs, start azithromycin, transition back to nasal canula as tolerated   2. Diabetes mellitus  - A1c was 6.5% last month  - He will be on systemic steroids as above  - Check CBG's and start SSI with Novolog   3. Bipolar I disorder  - Stable, denies SI, HI, or hallucinations  - Continue Abilify and Lexapro     DVT prophylaxis: Lovenox  Code Status: Do not intubate  Family Communication: Discussed with patient  Consults called: none Admission status: Inpatient    Vianne Bulls, MD [facility] Pager [phone]  If 7PM-7AM, please contact night-coverage [email or web address] Password [email or web address]  05/29/2017, 12:21 AM

## 2017-05-30 LAB — BASIC METABOLIC PANEL
ANION GAP: 6 (ref 5–15)
BUN: 14 mg/dL (ref 6–20)
CHLORIDE: 95 mmol/L — AB (ref 101–111)
CO2: 38 mmol/L — ABNORMAL HIGH (ref 22–32)
Calcium: 8.5 mg/dL — ABNORMAL LOW (ref 8.9–10.3)
Creatinine, Ser: 0.61 mg/dL (ref 0.61–1.24)
GFR calc non Af Amer: >60 mL/min (ref >60)
Glucose, Bld: 207 mg/dL — ABNORMAL HIGH (ref 65–99)
POTASSIUM: 4.9 mmol/L (ref 3.5–5.1)
Sodium: 139 mmol/L (ref 135–145)

## 2017-05-30 LAB — GLUCOSE, CAPILLARY
GLUCOSE-CAPILLARY: 97 mg/dL (ref 65–99)
Glucose-Capillary: 208 mg/dL — ABNORMAL HIGH (ref 65–99)
Glucose-Capillary: 220 mg/dL — ABNORMAL HIGH (ref 65–99)
Glucose-Capillary: 301 mg/dL — ABNORMAL HIGH (ref 65–99)

## 2017-05-30 MED ORDER — IPRATROPIUM-ALBUTEROL 0.5-2.5 (3) MG/3ML IN SOLN
3.0000 mL | Freq: Three times a day (TID) | RESPIRATORY_TRACT | Status: DC
  Administered 2017-05-30 – 2017-06-02 (×10): 3 mL via RESPIRATORY_TRACT
  Filled 2017-05-30 (×10): qty 3

## 2017-05-30 NOTE — Progress Notes (Signed)
Results for WYNDHAM, SANTILLI (MRN [id number]) as of 05/30/2017 13:52  Ref. Range 05/29/2017 07:17 05/29/2017 16:06 05/29/2017 20:53 05/30/2017 07:39 05/30/2017 11:53  Glucose-Capillary Latest Ref Range: 65 - 99 mg/dL 276 (H) 272 (H) 120 (H) 208 (H) 220 (H)  Noted that blood sugars are still greater than 180 mg/dl.  If blood sugars continue to be elevated, recommend adding Novolog 3 units TID as meal coverage if patient eats at least 50% of his meal.   Harvel Ricks RN BSN CDE Diabetes Coordinator Pager: [phone]  8am-5pm

## 2017-05-30 NOTE — Progress Notes (Signed)
PROGRESS NOTE    Taylor [name]  BMW:[id number] DOB: February 01, 1955 DOA: 05/28/2017 PCP: [facility]    Brief Narrative:  62 year old male who presented with dyspnea.  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  He complained of 2-day history of worsening dyspnea, severe in intensity, and the emergency department he was found in respiratory distress, respiratory rate of 26, heart rate 94, blood pressure 133/89, oxygen saturation 98% on submental oxygen.  He was tachypneic, positive use of accessory muscles, moist mucous membranes, lungs with significant decreased breath sounds bilaterally with positive wheezing, heart S1-S2 present, rhythmic, abdomen soft nontender, no lower extremity edema.  138, potassium 4.4, chloride 96, bicarb 36, glucose 258, BUN 10, creatinine 0.66, white count 9.9, hemoglobin 13.9, hematocrit 44.1, platelets 213.  X-ray with positive hyperinflation no infiltrates.  EKG, sinus tachycardia, left axis deviation, poor R wave progression, prolonged QRS.   Patient was admitted to the hospital with the working diagnosis of acute hypoxic respiratory failure due to COPD exacerbation.    Assessment & Plan:   Principal Problem:   COPD with acute exacerbation ([place]) Active Problems:   Steroid-induced diabetes mellitus ([place])   Bipolar 1 disorder ([place])   COPD exacerbation ([facility])   Adjustment disorder with depressed mood   Acute on chronic respiratory failure with hypoxia and hypercapnia ([place])  1. COPD exacerbation. Patient with symptomatic improvement, will continue with aggressive bronchodilator therapy and systemic steroids, plus a short course of Azithromycin. Continue dulera, hypertonic saline and flutter valve. Out of bed as tolerated and physical therapy evaluation.   2. Steroid induced hyperglycemia. Insulin sliding scale for  glucose cover and monitoring. Patient tolerating po well, with no nausea or vomiting, capillary glucose  276, 272, 120, 200, 220.   3. Dyslipidemia. Tolerating well pravastatin.   4. Depression. On ariprazole and escitalopram, no agitation or confusion    DVT prophylaxis:enoxaparin   Code Status: dnr Family Communication: no family at the bedside  Disposition Plan: home when clinically improved   Consultants:     Procedures:     Antimicrobials:    Subjective: Patient reports improvement in his dyspnea, but still not back to baseline, significant dyspnea on ambulation, no chest pain, no nausea or edema.   Objective: Vitals:   05/30/17 0033 05/30/17 0500 05/30/17 0737 05/30/17 0847  BP: 113/82  125/83   Pulse: 71  70   Resp: (!) 24  (!) 22   Temp: 98.3 F (36.8 C)  98.2 F (36.8 C)   TempSrc: Oral  Oral   SpO2: 98%  97% 98%  Weight:  68.4 kg (150 lb 12.7 oz)    Height:  5\' 11"  (1.803 m)      Intake/Output Summary (Last 24 hours) at 05/30/2017 1318 Last data filed at 05/30/2017 0745 Gross per 24 hour  Intake -  Output 1725 ml  Net -1725 ml   Filed Weights   05/30/17 0500  Weight: 68.4 kg (150 lb 12.7 oz)    Examination:   General: deconditioned Neurology: Awake and alert, non focal  E ENT: mild pallor, no icterus, oral mucosa moist Cardiovascular: No JVD. S1-S2 present, rhythmic, no gallops, rubs, or murmurs. No lower extremity edema. Pulmonary: decreased breath sounds bilaterally, improve air movement compared to yesterday, positive expiratory wheezing, no rhonchi or rales. Gastrointestinal. Abdomen with no organomegaly, non tender, no rebound or guarding Skin. No rashes Musculoskeletal: no joint deformities  Data Reviewed: I have personally reviewed following labs and imaging studies  CBC: Recent Labs  Lab 05/28/17 2217 05/28/17 2218  WBC 9.9  --   NEUTROABS 7.5  --   HGB 13.9 14.3  HCT 44.1 42.0  MCV 96.7  --   PLT 213  --     Basic Metabolic Panel: Recent Labs  Lab 05/28/17 2217 05/28/17 2218 05/29/17 0308 05/30/17 0313  NA 138 138 140 139  K 4.4 4.3 4.6 4.9  CL 96* 94* 98* 95*  CO2 36*  --  34* 38*  GLUCOSE 258* 254* 260* 207*  BUN 10 14 13 14   CREATININE 0.66 0.70 0.69 0.61  CALCIUM 8.3*  --  8.1* 8.5*   GFR: Estimated Creatinine Clearance: 92.6 mL/min (by C-G formula based on SCr of 0.61 mg/dL). Liver Function Tests: No results for input(s): AST, ALT, ALKPHOS, BILITOT, PROT, ALBUMIN in the last 168 hours. No results for input(s): LIPASE, AMYLASE in the last 168 hours. No results for input(s): AMMONIA in the last 168 hours. Coagulation Profile: No results for input(s): INR, PROTIME in the last 168 hours. Cardiac Enzymes: No results for input(s): CKTOTAL, CKMB, CKMBINDEX, TROPONINI in the last 168 hours. BNP (last 3 results) No results for input(s): PROBNP in the last 8760 hours. HbA1C: No results for input(s): HGBA1C in the last 72 hours. CBG: Recent Labs  Lab 05/29/17 0717 05/29/17 1606 05/29/17 2053 05/30/17 0739 05/30/17 1153  GLUCAP 276* 272* 120* 208* 220*   Lipid Profile: No results for input(s): CHOL, HDL, LDLCALC, TRIG, CHOLHDL, LDLDIRECT in the last 72 hours. Thyroid Function Tests: No results for input(s): TSH, T4TOTAL, FREET4, T3FREE, THYROIDAB in the last 72 hours. Anemia Panel: No results for input(s): VITAMINB12, FOLATE, FERRITIN, TIBC, IRON, RETICCTPCT in the last 72 hours.    Radiology Studies: I have reviewed all of the imaging during this hospital visit personally     Scheduled Meds: . ARIPiprazole  5 mg Oral Daily  . enoxaparin (LOVENOX) injection  40 mg Subcutaneous Q24H  . escitalopram  20 mg Oral Daily  . insulin aspart  0-5 Units Subcutaneous QHS  . insulin aspart  0-9 Units Subcutaneous TID WC  . ipratropium-albuterol  3 mL Nebulization TID  . methylPREDNISolone (SOLU-MEDROL) injection  40 mg Intravenous Q12H  . mometasone-formoterol  2 puff  Inhalation BID  . pravastatin  20 mg Oral q1800  . sodium chloride flush  3 mL Intravenous Q12H  . sodium chloride flush  3 mL Intravenous Q12H  . sodium chloride HYPERTONIC  4 mL Nebulization BID   Continuous Infusions: . sodium chloride    . azithromycin Stopped (05/30/17 0125)     LOS: 1 day        [place] Waldschmidt Gerome Apley, MD [facility] Pager [phone]

## 2017-05-30 NOTE — Progress Notes (Signed)
Patient refuses CPAP at this time, states he will let us know if he changes his mind.

## 2017-05-30 NOTE — Evaluation (Addendum)
Physical Therapy Evaluation Patient Details Name: ROSSI BURDO MRN: [id number] DOB: 1955-03-31 Today's Date: 05/30/2017   History of Present Illness  Pt os a 62 y.o. male admitted 05/28/17 with dyspnea; worked up for COPD exacerbation. PMH includes asthma, depression, COPD (3L home O2).     Clinical Impression  Patient evaluated by Physical Therapy with no further acute PT needs identified. Pt currently indep with all mobility, including pushing O2 tank. SpO2 92% on 3L O2 [place] with ambulation. Educ on energy conservation techniques as pt reports DOE performing ADLs and ambulating longer distances. All education has been completed and the patient has no further questions. PT is signing off. Thank you for this referral.    Follow Up Recommendations No PT follow up    Equipment Recommendations  Other (comment)(shower chair; interested in portable O2 with carrying backpack if a possibility)    Recommendations for Other Services       Precautions / Restrictions Precautions Precautions: None Restrictions Weight Bearing Restrictions: No      Mobility  Bed Mobility Overal bed mobility: Independent                Transfers Overall transfer level: Independent Equipment used: None Transfers: Sit to/from Stand              Ambulation/Gait Ambulation/Gait assistance: Independent Ambulation Distance (Feet): 150 Feet Assistive device: None Gait Pattern/deviations: WFL(Within Functional Limits) Gait velocity: Decreased   General Gait Details: Slow, steady amb with no DME, pt able to push O2 tank indep. 2x standing rest break to lean against wall secondary to DOE. SpO2 92% on 3L O2 [place]  Stairs            Wheelchair Mobility    Modified Rankin (Stroke Patients Only)       Balance Overall balance assessment: Needs assistance Sitting-balance support: Feet supported Sitting balance-Leahy Scale: Normal     Standing balance support: During functional  activity;No upper extremity supported Standing balance-Leahy Scale: Good               High level balance activites: Side stepping;Backward walking;Direction changes;Turns;Sudden stops High Level Balance Comments: No overt LOB or instability noted with higher level balance tasks             Pertinent Vitals/Pain Pain Assessment: No/denies pain    Home Living Family/patient expects to be discharged to:: Group home Living Arrangements: Non-relatives/Friends               Additional Comments: ramped entrance, no stairs inside, walk-in shower    Prior Function Level of Independence: Independent         Comments: 3L home O2     Hand Dominance        Extremity/Trunk Assessment   Upper Extremity Assessment Upper Extremity Assessment: Overall WFL for tasks assessed    Lower Extremity Assessment Lower Extremity Assessment: Overall WFL for tasks assessed    Cervical / Trunk Assessment Cervical / Trunk Assessment: Normal  Communication   Communication: No difficulties  Cognition Arousal/Alertness: Awake/alert Behavior During Therapy: WFL for tasks assessed/performed Overall Cognitive Status: Within Functional Limits for tasks assessed                                        General Comments      Exercises     Assessment/Plan    PT Assessment Patent does not need any  further PT services  PT Problem List         PT Treatment Interventions      PT Goals (Current goals can be found in the Care Plan section)  Acute Rehab PT Goals PT Goal Formulation: All assessment and education complete, DC therapy    Frequency     Barriers to discharge        Co-evaluation               AM-PAC PT "6 Clicks" Daily Activity  Outcome Measure Difficulty turning over in bed (including adjusting bedclothes, sheets and blankets)?: None Difficulty moving from lying on back to sitting on the side of the bed? : None Difficulty sitting down  on and standing up from a chair with arms (e.g., wheelchair, bedside commode, etc,.)?: None Help needed moving to and from a bed to chair (including a wheelchair)?: None Help needed walking in hospital room?: None Help needed climbing 3-5 steps with a railing? : A Little 6 Click Score: 23    End of Session Equipment Utilized During Treatment: Gait belt;Oxygen Activity Tolerance: Patient tolerated treatment well Patient left: in chair;with call bell/phone within reach Nurse Communication: Mobility status PT Visit Diagnosis: Other abnormalities of gait and mobility (R26.89)    Time: 2197-5883 PT Time Calculation (min) (ACUTE ONLY): 24 min   Charges:   PT Evaluation $PT Eval Moderate Complexity: 1 Mod PT Treatments $Therapeutic Activity: 8-22 mins   PT G Codes:       Mabeline Caras, PT, DPT Acute Rehab Services  Pager: [name] 05/30/2017, 12:19 PM

## 2017-05-31 LAB — BASIC METABOLIC PANEL
ANION GAP: 6 (ref 5–15)
BUN: 17 mg/dL (ref 6–20)
CO2: 40 mmol/L — AB (ref 22–32)
Calcium: 8.6 mg/dL — ABNORMAL LOW (ref 8.9–10.3)
Chloride: 92 mmol/L — ABNORMAL LOW (ref 101–111)
Creatinine, Ser: 0.71 mg/dL (ref 0.61–1.24)
GFR calc Af Amer: >60 mL/min (ref >60)
Glucose, Bld: 192 mg/dL — ABNORMAL HIGH (ref 65–99)
POTASSIUM: 5.2 mmol/L — AB (ref 3.5–5.1)
Sodium: 138 mmol/L (ref 135–145)

## 2017-05-31 LAB — GLUCOSE, CAPILLARY
GLUCOSE-CAPILLARY: 185 mg/dL — AB (ref 65–99)
GLUCOSE-CAPILLARY: 350 mg/dL — AB (ref 65–99)
GLUCOSE-CAPILLARY: 244 mg/dL — AB (ref 65–99)
Glucose-Capillary: 233 mg/dL — ABNORMAL HIGH (ref 65–99)

## 2017-05-31 NOTE — Progress Notes (Signed)
Patient does not wish to use NIV at this time, no distress noted RCP will continue to monitor.

## 2017-05-31 NOTE — Progress Notes (Signed)
PROGRESS NOTE    Taylor [name]  ZES:[id number] DOB: 1955/11/02 DOA: 05/28/2017 PCP: [facility]    Brief Narrative:  62 year old male who presented with dyspnea. 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 He complained of 2-day history of worsening dyspnea, severe in intensity, and the emergency department he was found in respiratory distress, respiratory rate of 26, heart rate 94, blood pressure 133/89, oxygen saturation 98% on submental oxygen. He was tachypneic, positive use of accessory muscles, moist mucous membranes, lungs with significant decreased breath sounds bilaterally with positive wheezing,heart S1-S2 present, rhythmic, abdomen soft nontender, no lower extremity edema.138, potassium 4.4, chloride 96, bicarb 36, glucose 258, BUN 10, creatinine 0.66,white count 9.9, hemoglobin 13.9,hematocrit 44.1, platelets 213.X-ray with positive hyperinflation no infiltrates. EKG, sinus tachycardia, left axis deviation, poor R wave progression, prolonged QRS.  Patient was admitted to the hospital withtheworking diagnosis of acute hypoxic respiratory failure due to COPD exacerbation.   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 COPD exacerbation. Continue with symptomatic improvement, responding well to aggressive bronchodilator therapy and systemic steroids, continue po Azithromycin, dulera, hypertonic saline and flutter valve. Physical therapy recommending no follow up.   2. Steroid induced hyperglycemia. Continue with insulin sliding scale for glucose cover and  monitoring, capillary glucose  220, 97, 301, 233, 185. Prompt steroid taper.   3. Dyslipidemia. Continue with pravastatin.   4. Depression. Continue with ariprazole and escitalopram.   5. New hyperkalemia with metabolic alkalosis. Will follow on renal panel in am, currently only mild elevation in serum K at 5,2, renal function is preserved with serum cr at 0.71. Noted serum bicarbonate at 40, likely due to chronic CO2 retention/ post hypercapnic metabolic alkalosis.   DVT prophylaxis:enoxaparin Code Status:dnr Family Communication:no family at the bedside Disposition Plan:home when clinically improved   Consultants:    Procedures:    Antimicrobials  Subjective: Patient is feeling better, dyspnea continue to improve, but not yet at baseline. No nausea or vomiting.   Objective: Vitals:   05/30/17 2034 05/30/17 2337 05/31/17 0739 05/31/17 0916  BP:  120/76 123/84   Pulse:  79 82   Resp:  16    Temp:  97.9 F (36.6 C) 98.4 F (36.9 C)   TempSrc:  Oral Oral   SpO2: 96% 99% 100% 99%  Weight:      Height:       No intake or output data in the 24 hours ending 05/31/17 1303 Filed Weights   05/30/17 0500  Weight: 68.4 kg (150 lb 12.7 oz)    Examination:   General: Not in pain or dyspnea, deconditioned Neurology: Awake and alert, non focal  E ENT: mild pallor, no icterus, oral mucosa moist Cardiovascular: No JVD. S1-S2 present, rhythmic, no gallops, rubs, or murmurs. No lower extremity edema. Pulmonary: decreased breath sounds bilaterally, decreased air movement, no wheezing, rhonchi, scattered rales. Gastrointestinal. Abdomen with no organomegaly, non tender, no rebound or guarding Skin. No rashes Musculoskeletal: no joint deformities     Data Reviewed: I have personally reviewed following labs and imaging studies  CBC: Recent Labs  Lab 05/28/17 2217 05/28/17 2218  WBC 9.9  --  NEUTROABS 7.5  --   HGB 13.9 14.3  HCT 44.1 42.0  MCV 96.7   --   PLT 213  --    Basic Metabolic Panel: Recent Labs  Lab 05/28/17 2217 05/28/17 2218 05/29/17 0308 05/30/17 0313 05/31/17 0241  NA 138 138 140 139 138  K 4.4 4.3 4.6 4.9 5.2*  CL 96* 94* 98* 95* 92*  CO2 36*  --  34* 38* 40*  GLUCOSE 258* 254* 260* 207* 192*  BUN 10 14 13 14 17   CREATININE 0.66 0.70 0.69 0.61 0.71  CALCIUM 8.3*  --  8.1* 8.5* 8.6*   GFR: Estimated Creatinine Clearance: 92.6 mL/min (by C-G formula based on SCr of 0.71 mg/dL). Liver Function Tests: No results for input(s): AST, ALT, ALKPHOS, BILITOT, PROT, ALBUMIN in the last 168 hours. No results for input(s): LIPASE, AMYLASE in the last 168 hours. No results for input(s): AMMONIA in the last 168 hours. Coagulation Profile: No results for input(s): INR, PROTIME in the last 168 hours. Cardiac Enzymes: No results for input(s): CKTOTAL, CKMB, CKMBINDEX, TROPONINI in the last 168 hours. BNP (last 3 results) No results for input(s): PROBNP in the last 8760 hours. HbA1C: No results for input(s): HGBA1C in the last 72 hours. CBG: Recent Labs  Lab 05/30/17 1153 05/30/17 1638 05/30/17 2126 05/31/17 0741 05/31/17 1149  GLUCAP 220* 97 301* 233* 185*   Lipid Profile: No results for input(s): CHOL, HDL, LDLCALC, TRIG, CHOLHDL, LDLDIRECT in the last 72 hours. Thyroid Function Tests: No results for input(s): TSH, T4TOTAL, FREET4, T3FREE, THYROIDAB in the last 72 hours. Anemia Panel: No results for input(s): VITAMINB12, FOLATE, FERRITIN, TIBC, IRON, RETICCTPCT in the last 72 hours.    Radiology Studies: I have reviewed all of the imaging during this hospital visit personally     Scheduled Meds: . ARIPiprazole  5 mg Oral Daily  . enoxaparin (LOVENOX) injection  40 mg Subcutaneous Q24H  . escitalopram  20 mg Oral Daily  . insulin aspart  0-5 Units Subcutaneous QHS  . insulin aspart  0-9 Units Subcutaneous TID WC  . ipratropium-albuterol  3 mL Nebulization TID  . methylPREDNISolone (SOLU-MEDROL)  injection  40 mg Intravenous Q12H  . mometasone-formoterol  2 puff Inhalation BID  . pravastatin  20 mg Oral q1800  . sodium chloride flush  3 mL Intravenous Q12H  . sodium chloride flush  3 mL Intravenous Q12H  . sodium chloride HYPERTONIC  4 mL Nebulization BID   Continuous Infusions: . sodium chloride    . azithromycin Stopped (05/31/17 0700)     LOS: 2 days        Mauricio Gerome Apley, MD [facility] Pager [phone]

## 2017-06-01 DIAGNOSIS — F4321 Adjustment disorder with depressed mood: Secondary | ICD-10-CM

## 2017-06-01 LAB — BASIC METABOLIC PANEL
ANION GAP: 9 (ref 5–15)
BUN: 14 mg/dL (ref 6–20)
CALCIUM: 8.6 mg/dL — AB (ref 8.9–10.3)
CO2: 38 mmol/L — ABNORMAL HIGH (ref 22–32)
Chloride: 92 mmol/L — ABNORMAL LOW (ref 101–111)
Creatinine, Ser: 0.64 mg/dL (ref 0.61–1.24)
GLUCOSE: 189 mg/dL — AB (ref 65–99)
POTASSIUM: 4.4 mmol/L (ref 3.5–5.1)
SODIUM: 139 mmol/L (ref 135–145)

## 2017-06-01 LAB — GLUCOSE, CAPILLARY
GLUCOSE-CAPILLARY: 233 mg/dL — AB (ref 65–99)
GLUCOSE-CAPILLARY: 317 mg/dL — AB (ref 65–99)
Glucose-Capillary: 248 mg/dL — ABNORMAL HIGH (ref 65–99)
Glucose-Capillary: 201 mg/dL — ABNORMAL HIGH (ref 65–99)
Glucose-Capillary: 118 mg/dL — ABNORMAL HIGH (ref 65–99)

## 2017-06-01 MED ORDER — METHYLPREDNISOLONE SODIUM SUCC 40 MG IJ SOLR
40.0000 mg | Freq: Every day | INTRAMUSCULAR | Status: DC
  Administered 2017-06-02: 40 mg via INTRAVENOUS
  Filled 2017-06-01: qty 1

## 2017-06-01 MED ORDER — AZITHROMYCIN 250 MG PO TABS
500.0000 mg | ORAL_TABLET | Freq: Every day | ORAL | Status: DC
  Administered 2017-06-01: 500 mg via ORAL
  Filled 2017-06-01: qty 2

## 2017-06-01 NOTE — Progress Notes (Signed)
PROGRESS NOTE    Taylor [name]  ZOX:[id number] DOB: 06-30-1955 DOA: 05/28/2017 PCP: [facility]    Brief Narrative:  62 year old male who presented with dyspnea. 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 He complained of 2-day history of worsening dyspnea, severe in intensity, and the emergency department he was found in respiratory distress, respiratory rate of 26, heart rate 94, blood pressure 133/89, oxygen saturation 98% on submental oxygen. He was tachypneic, positive use of accessory muscles, moist mucous membranes, lungs with significant decreased breath sounds bilaterally with positive wheezing,heart S1-S2 present, rhythmic, abdomen soft nontender, no lower extremity edema.138, potassium 4.4, chloride 96, bicarb 36, glucose 258, BUN 10, creatinine 0.66,white count 9.9, hemoglobin 13.9,hematocrit 44.1, platelets 213.X-ray with positive hyperinflation no infiltrates. EKG, sinus tachycardia, left axis deviation, poor R wave progression, prolonged QRS.  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 COPD exacerbation. Slow symptomatic improvement, continue with aggressive bronchodilator therapy, dulera, hypertonic saline nebs and flutter valve.Taper systemic steroids, out of bed as tolerated and ambulation, in preparation for prompt discharge.  2. Steroid induced hyperglycemia. Insulin sliding scale for glucose cover and monitoring, capillary  glucose 233, 248, 317, 201.  3. Dyslipidemia. onpravastatin with good toleration.   4. Depression.On ariprazole and escitalopram.   5. New hyperkalemia with metabolic alkalosis. K down to 4,4, renal function continue to be preserved. Serum bicarbonate stable at 38.  DVT prophylaxis:enoxaparin Code Status:dnr Family Communication:no family at the bedside Disposition Plan:home in am if continue to improve   Consultants:    Procedures:    Antimicrobials   Subjective: Patient continue to have dyspnea and weakness, no significant cough, tolerating po well, no nausea or vomiting. Worsening dypnea on exertion.   Objective: Vitals:   05/31/17 2319 06/01/17 0536 06/01/17 0732 06/01/17 0834  BP: 129/83  125/78   Pulse: 85  84   Resp: 17  16   Temp: 98.4 F (36.9 C)  98.2 F (36.8 C)   TempSrc: Oral  Oral   SpO2: 98% 94% 98% 97%  Weight:      Height:        Intake/Output Summary (Last 24 hours) at 06/01/2017 0933 Last data filed at 06/01/2017 0900 Gross per 24 hour  Intake 1470 ml  Output 800 ml  Net 670 ml   Filed Weights   05/30/17 0500  Weight: 68.4 kg (150 lb 12.7 oz)    Examination:   General: deconditioned and dyspneic.  Neurology: Awake and alert, non focal  E ENT: mild pallor, no icterus, oral mucosa moist Cardiovascular: No JVD. S1-S2 present, rhythmic, no gallops, rubs, or murmurs. No lower extremity edema. Pulmonary: decreased breath sounds bilaterally, decreased air movement, scattered wheezing, no rhonchi or rales. Gastrointestinal. Abdomen with no organomegaly, non tender, no rebound or guarding Skin. No rashes Musculoskeletal: no joint deformities    Data Reviewed: I have personally reviewed following labs and imaging studies  CBC: Recent Labs  Lab 05/28/17 2217 05/28/17 2218  WBC 9.9  --   NEUTROABS 7.5  --  HGB 13.9 14.3  HCT 44.1 42.0  MCV 96.7  --   PLT 213  --    Basic Metabolic Panel: Recent Labs  Lab  05/28/17 2217 05/28/17 2218 05/29/17 0308 05/30/17 0313 05/31/17 0241 06/01/17 0300  NA 138 138 140 139 138 139  K 4.4 4.3 4.6 4.9 5.2* 4.4  CL 96* 94* 98* 95* 92* 92*  CO2 36*  --  34* 38* 40* 38*  GLUCOSE 258* 254* 260* 207* 192* 189*  BUN 10 14 13 14 17 14   CREATININE 0.66 0.70 0.69 0.61 0.71 0.64  CALCIUM 8.3*  --  8.1* 8.5* 8.6* 8.6*   GFR: Estimated Creatinine Clearance: 92.6 mL/min (by C-G formula based on SCr of 0.64 mg/dL). Liver Function Tests: No results for input(s): AST, ALT, ALKPHOS, BILITOT, PROT, ALBUMIN in the last 168 hours. No results for input(s): LIPASE, AMYLASE in the last 168 hours. No results for input(s): AMMONIA in the last 168 hours. Coagulation Profile: No results for input(s): INR, PROTIME in the last 168 hours. Cardiac Enzymes: No results for input(s): CKTOTAL, CKMB, CKMBINDEX, TROPONINI in the last 168 hours. BNP (last 3 results) No results for input(s): PROBNP in the last 8760 hours. HbA1C: No results for input(s): HGBA1C in the last 72 hours. CBG: Recent Labs  Lab 05/31/17 0741 05/31/17 1149 05/31/17 1607 05/31/17 2051 06/01/17 0735  GLUCAP 233* 185* 350* 244* 233*   Lipid Profile: No results for input(s): CHOL, HDL, LDLCALC, TRIG, CHOLHDL, LDLDIRECT in the last 72 hours. Thyroid Function Tests: No results for input(s): TSH, T4TOTAL, FREET4, T3FREE, THYROIDAB in the last 72 hours. Anemia Panel: No results for input(s): VITAMINB12, FOLATE, FERRITIN, TIBC, IRON, RETICCTPCT in the last 72 hours.    Radiology Studies: I have reviewed all of the imaging during this hospital visit personally     Scheduled Meds: . ARIPiprazole  5 mg Oral Daily  . enoxaparin (LOVENOX) injection  40 mg Subcutaneous Q24H  . escitalopram  20 mg Oral Daily  . insulin aspart  0-5 Units Subcutaneous QHS  . insulin aspart  0-9 Units Subcutaneous TID WC  . ipratropium-albuterol  3 mL Nebulization TID  . methylPREDNISolone (SOLU-MEDROL) injection  40 mg  Intravenous Q12H  . mometasone-formoterol  2 puff Inhalation BID  . pravastatin  20 mg Oral q1800  . sodium chloride flush  3 mL Intravenous Q12H  . sodium chloride flush  3 mL Intravenous Q12H   Continuous Infusions: . sodium chloride    . azithromycin Stopped (06/01/17 0158)     LOS: 3 days        Antoinette Haskett Gerome Apley, MD [facility] Pager [phone]

## 2017-06-01 NOTE — Progress Notes (Signed)
RN assumed care of pt @ 2300. Pt lying in bed awake, resting. Pt requested a cup of coffee. Pt in NAD at this time. Will continue to monitor. Clint Bolder, RN 06/01/17 12:28 AM

## 2017-06-01 NOTE — Evaluation (Signed)
Physical Therapy Re-Evaluation & Discharge Patient Details Name: KAIYAN LUCZAK MRN: [id number] DOB: 02-28-1955 Today's Date: 06/01/2017   History of Present Illness  Pt os a 62 y.o. male admitted 05/28/17 with dyspnea; worked up for COPD exacerbation. PMH includes asthma, depression, COPD (3L home O2), depression.     Clinical Impression  Pt seen for re-evaluation request; no change in medical status, but reports episodes of weakness ambulating to/from bathroom. VSS with mobility this session (see below); pt with some shakiness and DOE up to 3/4 with ambulation, which he reports is baseline; denies lightheadedness/dizziness. Amb 170' independently, requiring 1x standing rest break secondary to SOB. Pt with good ability to switch from wall O2 to portable tank independently for mobility out of room. Not interested in use of rollator or other DME to assist with energy conservation. RN aware that pt is indep with mobility. Acute PT will sign off.  SpO2 92-97% on 3L O2 [place] HR up to 128 while sitting  Sitting BP 132/79 Standing BP 126/80 Standing 3 min BP 121/75    Follow Up Recommendations No PT follow up    Equipment Recommendations  Other (comment)(shower chair)    Recommendations for Other Services       Precautions / Restrictions Precautions Precautions: None Restrictions Weight Bearing Restrictions: No      Mobility  Bed Mobility Overal bed mobility: Modified Independent             General bed mobility comments: Increased effort. Pt having to stop to catch breath secondary to c/o DOE. SpO2 97% on 3L O2 [place], HR up to 128 sitting EOB  Transfers Overall transfer level: Independent Equipment used: None Transfers: Sit to/from [employer] transfer comment: Initial reaching for sink for UE support, able to stand without once cues  Ambulation/Gait Ambulation/Gait assistance: Independent Ambulation Distance (Feet): 170 Feet Assistive device:  None Gait Pattern/deviations: Step-through pattern;Decreased stride length Gait velocity: Decreased   General Gait Details: Slow, steady amb. DOE 3/4, requiring 1x standing rest break leaning against wall. SpO2 92-97% on 3L O2   Stairs            Wheelchair Mobility    Modified Rankin (Stroke Patients Only)       Balance Overall balance assessment: Needs assistance Sitting-balance support: Feet supported Sitting balance-Leahy Scale: Normal     Standing balance support: During functional activity;No upper extremity supported Standing balance-Leahy Scale: Good                               Pertinent Vitals/Pain Pain Assessment: No/denies pain    Home Living Family/patient expects to be discharged to:: Group home Living Arrangements: Non-relatives/Friends               Additional Comments: ramped entrance, no stairs inside, walk-in shower    Prior Function Level of Independence: Independent         Comments: 3L home O2. Limited to short ambulation distance secondary to SOB     Hand Dominance        Extremity/Trunk Assessment   Upper Extremity Assessment Upper Extremity Assessment: Overall WFL for tasks assessed    Lower Extremity Assessment Lower Extremity Assessment: Overall WFL for tasks assessed    Cervical / Trunk Assessment Cervical / Trunk Assessment: Normal  Communication   Communication: No difficulties  Cognition Arousal/Alertness: Awake/alert Behavior During Therapy: Flat affect Overall Cognitive  Status: Within Functional Limits for tasks assessed                                        General Comments      Exercises     Assessment/Plan    PT Assessment Patent does not need any further PT services  PT Problem List         PT Treatment Interventions      PT Goals (Current goals can be found in the Care Plan section)  Acute Rehab PT Goals PT Goal Formulation: All assessment and education  complete, DC therapy    Frequency     Barriers to discharge        Co-evaluation               AM-PAC PT "6 Clicks" Daily Activity  Outcome Measure Difficulty turning over in bed (including adjusting bedclothes, sheets and blankets)?: None Difficulty moving from lying on back to sitting on the side of the bed? : None Difficulty sitting down on and standing up from a chair with arms (e.g., wheelchair, bedside commode, etc,.)?: None Help needed moving to and from a bed to chair (including a wheelchair)?: None Help needed walking in hospital room?: None Help needed climbing 3-5 steps with a railing? : A Little 6 Click Score: 23    End of Session Equipment Utilized During Treatment: Gait belt;Oxygen Activity Tolerance: Patient tolerated treatment well Patient left: with call bell/phone within reach(using bathroom) Nurse Communication: Mobility status PT Visit Diagnosis: Other abnormalities of gait and mobility (R26.89)    Time: 8916-9450 PT Time Calculation (min) (ACUTE ONLY): 24 min   Charges:   PT Evaluation $PT Re-evaluation: 1 Re-eval PT Treatments $Self Care/Home Management: 8-22   PT G Codes:       Mabeline Caras, PT, DPT Acute Rehab Services  Pager: [place] 06/01/2017, 1:51 PM

## 2017-06-02 LAB — GLUCOSE, CAPILLARY: GLUCOSE-CAPILLARY: 137 mg/dL — AB (ref 65–99)

## 2017-06-02 MED ORDER — ALBUTEROL SULFATE (2.5 MG/3ML) 0.083% IN NEBU: 2.5000 mg | INHALATION_SOLUTION | Freq: Four times a day (QID) | RESPIRATORY_TRACT | 0 refills | Status: DC | PRN

## 2017-06-02 MED ORDER — AZITHROMYCIN 500 MG PO TABS: 500.0000 mg | ORAL_TABLET | Freq: Every day | ORAL | 0 refills | Status: AC

## 2017-06-02 MED ORDER — ACETAMINOPHEN 500 MG PO TABS: 500.0000 mg | ORAL_TABLET | Freq: Four times a day (QID) | ORAL | 0 refills | Status: DC | PRN

## 2017-06-02 MED ORDER — FLUTICASONE PROPIONATE HFA 110 MCG/ACT IN AERO: 2.0000 | INHALATION_SPRAY | Freq: Two times a day (BID) | RESPIRATORY_TRACT | 0 refills | Status: DC

## 2017-06-02 NOTE — Care Management Note (Addendum)
Case Management Note  Patient Details  Name: Taylor [name] MRN: [id number] Date of Birth: [birth year]  Subjective/Objective:  From home , he is on home oxygen, he states he will need ambulance transport because he is on oxygen and he has no oxygen tanks here.  He now has Medicaid to help him get his medications.  He goes to [facility] for PCP. He states he does not want a shower chair.  Address confirmed with patient he will be going to [address], [name] [address]. Ambulance forms on chart. Transport time is 11:30, NCM informed Ambulance person.                    Action/Plan: DC home today via PTAR.   Expected Discharge Date:  06/02/17               Expected Discharge Plan:  Home/Self Care  In-House Referral:     Discharge planning Services  CM Consult  Post Acute Care Choice:    Choice offered to:     DME Arranged:    DME Agency:     HH Arranged:    HH Agency:     Status of Service:  Completed, signed off  If discussed at [employer] of Stay Meetings, dates discussed:    Additional Comments:  [name], RN 06/02/2017, 9:41 AM

## 2017-06-02 NOTE — Plan of Care (Signed)
Discussed plan of care with patient.  Patient understands the importance of taking medication as prescribed.  Patient displayed some teach-back.

## 2017-06-02 NOTE — Discharge Summary (Addendum)
Physician Discharge Summary  Taylor [name] QAS:[id number] DOB: 1955/02/05 DOA: 05/28/2017  PCP: [facility]  Admit date: 05/28/2017 Discharge date: 06/02/2017  Admitted From: Home Disposition:  Home  Recommendations for Outpatient Follow-up and new medication changes:  1. Follow up with PCP in 1-week 2. Patient will take Azithromycin daily for 3 more days. 3. Renewed prescription for albuterol and fluticasone. 4. Recommended to take as needed acetaminophen for pain.   Home Health:no   Equipment/Devices: Home 02, nebulizer machine   Discharge Condition: stable  CODE STATUS: full  Diet recommendation: Regular.   Brief/Interim Summary: 62 year old male who presented with dyspnea. 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 He complained of 2-day history of worsening dyspnea, severe in intensity. In the emergency department he was found in respiratory distress, respiratory rate of 26, heart rate 94, blood pressure 133/89, oxygen saturation 98% on supplemental oxygen. He was tachypneic, positive use of accessory muscles, moist mucous membranes, lungs with significant decreased breath sounds bilaterally with positive wheezing,heart S1-S2 present, rhythmic, abdomen soft nontender, no lower extremity edema.Sodium138, potassium 4.4, chloride 96, bicarb 36, glucose 258, BUN 10, creatinine 0.66,white count 9.9, hemoglobin 13.9,hematocrit 44.1, platelets 213.ChestX-ray with positive hyperinflation no infiltrates. EKG, sinus tachycardia, left axis deviation, poor R wave progression, prolonged QRS.  Patient was admitted to the hospital withtheworking diagnosis of acute hypoxic respiratory failure due to COPD exacerbation  1.  Acute on chronic hypoxic and hypercapnic respiratory failure due to COPD exacerbation.  Patient initially received  noninvasive mechanical ventilation, systemic steroids, and aggressive bronchodilators.  He was successfully transitioned to nasal cannula, he continued to progress slowly, and he was deemed stable to be discharged May 13.  He will continue inhaled corticosteroids, beta 2 agonist, and long-acting anticholinergics.  3 days more of azithromycin, no further systemic steroids.  Patient was seen by physical therapy with no follow-up recommended.  Oximetry saturation at discharge 97% on 2 L nasal cannula.   2.  Steroid-induced hyperglycemia.  Patient was placed on insulin sliding scale for glucose coverage and monitoring, his capillary glucose peaked at 317, systemic steroids were rapidly tapered down, and at discharge his fasting glucose 137.  3.  Hyperkalemia with metabolic alkalosis.  Electrolytes were closely monitored, peak potassium at 5.2, discharge potassium 4.4.  His serum bicarbonate is 38 at discharge suggesting post hypercapnic metabolic alkalosis.  His kidney function remains well-preserved with a discharge creatinine 0.64.  4.  Dyslipidemia.  Patient continue pravastatin.  5.  Bipolar.  Continue ariprazole and citalopram, no confusion or agitation.   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   albuterol (2.5 MG/3ML) 0.083%  nebulizer solution Commonly known as:  PROVENTIL  Take 3 mLs (2.5 mg total) by nebulization every 6 (six) hours as needed for wheezing or shortness of breath. Dx: J44.9   ARIPiprazole 5 MG tablet Commonly known as:  ABILIFY Take 1 tablet (5 mg total) by mouth every morning.   azithromycin 500 MG tablet Commonly known as:  ZITHROMAX Take 1 tablet (500 mg total) by mouth daily for 3 days.   escitalopram 20 MG tablet Commonly known as:  LEXAPRO Take 1 tablet (20 mg total) by mouth every morning.   fluticasone 110 MCG/ACT inhaler Commonly known as:  FLOVENT HFA Inhale 2 puffs into the lungs 2 (two) times daily.   lovastatin 20 MG tablet Commonly known as:  MEVACOR Take 1 tablet (20 mg total) by mouth at bedtime.   mometasone-formoterol 100-5 MCG/ACT Aero Commonly known as:  DULERA Inhale 2 puffs into the lungs 2 (two) times daily.   OXYGEN Inhale 3 L into the lungs.   SPIRIVA HANDIHALER 18 MCG inhalation capsule Generic drug:  tiotropium Place 18 mcg into inhaler and inhale daily.       Allergies  Allergen Reactions  . Codeine Nausea And Vomiting    Consultations:     Procedures/Studies: Dg Chest 2 View  Result Date: 05/20/2017 CLINICAL DATA:  Shortness of breath EXAM: CHEST - 2 VIEW COMPARISON:  05/13/2017 FINDINGS: Lungs are clear.  No pleural effusion or pneumothorax. The heart is normal in size. Mild degenerative changes of the visualized thoracolumbar spine. IMPRESSION: Normal chest radiographs. Electronically Signed   By: Julian Hy M.D.   On: 05/20/2017 22:44   Ct Head Wo Contrast  Result Date: 05/21/2017 CLINICAL DATA:  Headache and generalize weakness for 2 days. EXAM: CT HEAD WITHOUT CONTRAST TECHNIQUE: Contiguous axial images were obtained from the base of the skull through the vertex without intravenous contrast. COMPARISON:  12/04/2016 FINDINGS: Brain: No evidence of acute infarction, hemorrhage, hydrocephalus, extra-axial collection or mass  lesion/mass effect. Vascular: No hyperdense vessel or unexpected calcification. Skull: Normal. Negative for fracture or focal lesion. Sinuses/Orbits: No acute finding. Other: None. IMPRESSION: No acute intracranial abnormalities. Electronically Signed   By: Lucienne Capers M.D.   On: 05/21/2017 03:03   Ct Angio Chest Pe W Or Wo Contrast  Result Date: 05/21/2017 CLINICAL DATA:  Sternal pain for 2 days.  History of COPD. EXAM: CT ANGIOGRAPHY CHEST WITH CONTRAST TECHNIQUE: Multidetector CT imaging of the chest was performed using the standard protocol during bolus administration of intravenous contrast. Multiplanar CT image reconstructions and MIPs were obtained to evaluate the vascular anatomy. CONTRAST:  158mL ISOVUE-370 IOPAMIDOL (ISOVUE-370) INJECTION 76% COMPARISON:  None. FINDINGS: Cardiovascular: Good opacification of the central and segmental pulmonary arteries. No focal filling defects. No evidence of significant pulmonary embolus. Normal heart size. No pericardial effusion. Normal caliber thoracic aorta. No aortic dissection. Great vessel origins are patent. Coronary artery calcifications. Mediastinum/Nodes: No enlarged mediastinal, hilar, or axillary lymph nodes. Thyroid gland, trachea, and esophagus demonstrate no significant findings. Lungs/Pleura: Emphysematous changes in the lungs. Bronchial wall thickening consistent with chronic bronchitis. No airspace disease or consolidation. No pleural effusions. No pneumothorax. Airways are patent. Upper Abdomen: No acute process demonstrated in the visualized upper abdomen. Musculoskeletal: Old right third rib deformity. Degenerative changes in the spine. No destructive bone lesions. Review of the MIP images confirms the above findings. IMPRESSION: 1. No evidence of significant pulmonary embolus. 2. Emphysematous changes in the lungs. No evidence of active pulmonary disease. 3. Mild coronary artery calcifications. Emphysema (ICD10-J43.9). Electronically Signed    By:  Lucienne Capers M.D.   On: 05/21/2017 03:08   Dg Chest Port 1 View  Result Date: 05/28/2017 CLINICAL DATA:  Shortness of breath.  COPD.  Former smoker. EXAM: PORTABLE CHEST 1 VIEW COMPARISON:  05/24/2017. FINDINGS: Normal cardiomediastinal silhouette. Hyperinflation without consolidation or edema. Chronic LEFT CP angle blunting. Mild basilar scarring. No pneumothorax. No acute osseous findings. Chronic rib fractures. IMPRESSION: COPD.  No active disease. Electronically Signed   By: Staci Righter M.D.   On: 05/28/2017 23:14   Dg Chest Port 1 View  Result Date: 05/24/2017 CLINICAL DATA:  Central chest pain and shortness of breath for 3 days. EXAM: PORTABLE CHEST 1 VIEW COMPARISON:  Chest CT 05/21/2017, chest radiograph 05/20/2017 FINDINGS: Cardiomediastinal silhouette is normal. Mediastinal contours appear intact. There is no evidence of focal airspace consolidation, pleural effusion or pneumothorax. Emphysematous changes with hyperinflation of the lungs again seen. Osseous structures are without acute abnormality. Soft tissues are grossly normal. IMPRESSION: Emphysematous changes with hyperinflation of the lungs. No evidence of focal consolidation. Electronically Signed   By: Fidela Salisbury M.D.   On: 05/24/2017 17:20   Dg Chest Port 1 View  Result Date: 05/13/2017 CLINICAL DATA:  Shortness of breath EXAM: PORTABLE CHEST 1 VIEW COMPARISON:  April 29, 2017 FINDINGS: There is relative lucency in the upper lung zones, particular on the right, suggesting underlying bullous disease. There is no edema or consolidation. Heart size and normal. Somewhat diminished vascularity in the upper lobes is suggestive of potential underlying bullous disease. No adenopathy. There old healed posterior rib fractures on the right superiorly. IMPRESSION: Suspect upper lobe bullous disease, particular on the right. No edema or consolidation. Electronically Signed   By: Lowella Grip III M.D.   On: 05/13/2017 07:51        Subjective: Patient is feeling back to his baseline, dyspnea has improved, no chest pain, no nausea or vomiting,   Discharge Exam: Vitals:   06/02/17 0724 06/02/17 0736  BP:  112/80  Pulse: 87 81  Resp: 16 (!) 22  Temp:  (!) 97.5 F (36.4 C)  SpO2: 97% 99%   Vitals:   06/01/17 2030 06/02/17 0010 06/02/17 0724 06/02/17 0736  BP:  130/87  112/80  Pulse:  88 87 81  Resp:  19 16 (!) 22  Temp:  97.7 F (36.5 C)  (!) 97.5 F (36.4 C)  TempSrc:  Oral  Oral  SpO2: 98% 98% 97% 99%  Weight:      Height:        General: Not in pain or dyspnea.  Neurology: Awake and alert, non focal  E ENT: no pallor, no icterus, oral mucosa moist Cardiovascular: No JVD. S1-S2 present, rhythmic, no gallops, rubs, or murmurs. No lower extremity edema. Pulmonary: mild decreased breath sounds bilaterally,  no wheezing, rhonchi or rales. Gastrointestinal. Abdomen with no organomegaly, non tender, no rebound or guarding Skin. No rashes Musculoskeletal: no joint deformities   The results of significant diagnostics from this hospitalization (including imaging, microbiology, ancillary and laboratory) are listed below for reference.     Microbiology: No results found for this or any previous visit (from the past 240 hour(s)).   Labs: BNP (last 3 results) Recent Labs    04/05/17 2241 05/23/17 1052 05/28/17 2217  BNP 43.0 93.4 25.9   Basic Metabolic Panel: Recent Labs  Lab 05/28/17 2217 05/28/17 2218 05/29/17 0308 05/30/17 0313 05/31/17 0241 06/01/17 0300  NA 138 138 140 139 138 139  K 4.4 4.3 4.6 4.9 5.2*  4.4  CL 96* 94* 98* 95* 92* 92*  CO2 36*  --  34* 38* 40* 38*  GLUCOSE 258* 254* 260* 207* 192* 189*  BUN 10 14 13 14 17 14   CREATININE 0.66 0.70 0.69 0.61 0.71 0.64  CALCIUM 8.3*  --  8.1* 8.5* 8.6* 8.6*   Liver Function Tests: No results for input(s): AST, ALT, ALKPHOS, BILITOT, PROT, ALBUMIN in the last 168 hours. No results for input(s): LIPASE, AMYLASE in the last  168 hours. No results for input(s): AMMONIA in the last 168 hours. CBC: Recent Labs  Lab 05/28/17 2217 05/28/17 2218  WBC 9.9  --   NEUTROABS 7.5  --   HGB 13.9 14.3  HCT 44.1 42.0  MCV 96.7  --   PLT 213  --    Cardiac Enzymes: No results for input(s): CKTOTAL, CKMB, CKMBINDEX, TROPONINI in the last 168 hours. BNP: Invalid input(s): POCBNP CBG: Recent Labs  Lab 06/01/17 1154 06/01/17 1334 06/01/17 1510 06/01/17 1608 06/02/17 0732  GLUCAP 248* 317* 201* 118* 137*   D-Dimer No results for input(s): DDIMER in the last 72 hours. Hgb A1c No results for input(s): HGBA1C in the last 72 hours. Lipid Profile No results for input(s): CHOL, HDL, LDLCALC, TRIG, CHOLHDL, LDLDIRECT in the last 72 hours. Thyroid function studies No results for input(s): TSH, T4TOTAL, T3FREE, THYROIDAB in the last 72 hours.  Invalid input(s): FREET3 Anemia work up No results for input(s): VITAMINB12, FOLATE, FERRITIN, TIBC, IRON, RETICCTPCT in the last 72 hours. Urinalysis    Component Value Date/Time   COLORURINE YELLOW 04/18/2017 0809   APPEARANCEUR CLEAR 04/18/2017 0809   LABSPEC 1.023 04/18/2017 0809   PHURINE 7.0 04/18/2017 0809   GLUCOSEU >=500 (A) 04/18/2017 0809   HGBUR NEGATIVE 04/18/2017 0809   BILIRUBINUR NEGATIVE 04/18/2017 0809   KETONESUR 5 (A) 04/18/2017 0809   PROTEINUR NEGATIVE 04/18/2017 0809   UROBILINOGEN 1.0 01/09/2013 1843   NITRITE NEGATIVE 04/18/2017 0809   LEUKOCYTESUR NEGATIVE 04/18/2017 0809   Sepsis Labs Invalid input(s): PROCALCITONIN,  WBC,  LACTICIDVEN Microbiology No results found for this or any previous visit (from the past 240 hour(s)).   Time coordinating discharge: 45 minutes  SIGNED:   Tawni Millers, MD  [facility] 06/02/2017, 8:02 AM Pager [phone]  If 7PM-7AM, please contact night-coverage [email or web address] Password [email or web address]

## 2017-06-02 NOTE — Discharge Summary (Signed)
Physician Discharge Summary  Taylor [name] UKG:[id number] DOB: 11-Oct-1955 DOA: 05/13/2017  PCP: [facility]  Admit date: 05/13/2017 Discharge date: 06/02/2017  Time spent: 20 minutes  Recommendations for Outpatient Follow-up:  1. Follow up with PCP 1 week for labs xrays etc 2. a1c in 1-2 mo  Discharge Diagnoses:  Principal Problem:   COPD with acute exacerbation ([place]) Active Problems:   Bipolar 1 disorder ([place])   Tobacco abuse   HLD (hyperlipidemia)   Discharge Condition: poor  Diet recommendation: hh low salt  Filed Weights   05/13/17 2226 05/17/17 0500 05/18/17 0500  Weight: 73.9 kg (163 lb) 75.1 kg (165 lb 9.1 oz) 74.9 kg (165 lb 2 oz)    History of present illness:    62 y.o. male with a history of COPD on 2L O2 for the past month since recent DC from [facility] for COPD exacerbation, tobacco use, and bipolar disorder who presented to the ED with worsening dyspnea. Initially required BiPAP but recovered quickly with steroids, breathing treatments, also given doxycycline. He continues to have increased work of breathing with any exertion and abnormal exam.   Assessment & Plan:  Acute on chronic hypoxic respiratory failure: Due to acute exacerbation of COPD. Precipitated by lack of medications at home, ongoing tobacco use.  - Continue doxycycline as well as scheduled duonebs and prn xopenex (has symptomatic sinus tachycardia w/albuterol on 4/23).  - Discussed with care management. Will make PCP follow up as soon as possible following discharge, has pulmonology follow up already scheduled 5/7 at 2:15pm with Dr. Melvyn Novas. Plan to provide prescription for albuterol neb solution, dulera, spiriva. Trelegy ellipta not covered by medicaid though may be able to get samples from [facility] office.  -No wheezing on exam 4/26 therefore changing back steroids from Solu-Medrol 4 times daily to 60 every 12 4/26 --->PO prednisone 40 --stabilize don d/c -desat screen pending, nursing  aware  Tobacco use:  - Nicotine patch provided - Cessation counseling provided, he's down to a few per day.   Hyperlipidemia:  - Continue statin  Steroid-induced hyperglycemia: HbA1c 6.5% recently - CBGs and SSI AC/HS--sugars are in the 200s but I expect with dropping steorids will help -no more coke and puddings  Bipolar disorder: Discussed danger of going on and off medications abruptly.  - Restarted home medications which he'd not been on. May need short term prescription to get to follow up appointment with new PCP at discharge.  -will be going to "half-way house" on d/c  Hypernatremia: Possibly iatrogenic w/IVF's.  - No further isotonic saline, repleted and down from 150-->138 on d/c  Awake alert pleasant oriented in no distress     Discharge Exam: Vitals:   05/17/17 2341 05/18/17 0417  BP: 133/81 (!) 126/92  Pulse: 81 83  Resp: 16   Temp: 98.4 F (36.9 C) 97.9 F (36.6 C)  SpO2: 96% 98%    General: awake alert overtly is better--anxious Cardiovascular: s1 s2 no m/r/g Respiratory: clesr no added sound no wheeze  Discharge Instructions   Discharge Instructions    Diet - low sodium heart healthy   Complete by:  As directed    Discharge instructions   Complete by:  As directed    STOP smoking completely We will give you oxygen to use We have refilled all your medications You will get some prednisone Please follow up with your PCP Please get a blood sugar test within 1 month , your doc can tell you more about this  Increase activity slowly   Complete by:  As directed      Allergies as of 05/18/2017      Reactions   Codeine Nausea And Vomiting      Medication List    STOP taking these medications   albuterol (2.5 MG/3ML) 0.083% nebulizer solution Commonly known as:  PROVENTIL   fluticasone 110 MCG/ACT inhaler Commonly known as:  FLOVENT HFA   tiotropium 18 MCG inhalation capsule Commonly known as:  SPIRIVA     TAKE these medications    ARIPiprazole 5 MG tablet Commonly known as:  ABILIFY Take 1 tablet (5 mg total) by mouth every morning.   escitalopram 20 MG tablet Commonly known as:  LEXAPRO Take 1 tablet (20 mg total) by mouth every morning.   lovastatin 20 MG tablet Commonly known as:  MEVACOR Take 1 tablet (20 mg total) by mouth at bedtime.   mometasone-formoterol 100-5 MCG/ACT Aero Commonly known as:  DULERA Inhale 2 puffs into the lungs 2 (two) times daily.   OXYGEN Inhale 3 L into the lungs.      Allergies  Allergen Reactions  . Codeine Nausea And Vomiting   Follow-up [facility] Follow up on 05/19/2017.   Why:  Your appointment time is 9 am. Please bring current medications and your insurance card. Contact information: [place] [address] [phone]            The results of significant diagnostics from this hospitalization (including imaging, microbiology, ancillary and laboratory) are listed below for reference.    Significant Diagnostic Studies: Dg Chest 2 View  Result Date: 05/20/2017 CLINICAL DATA:  Shortness of breath EXAM: CHEST - 2 VIEW COMPARISON:  05/13/2017 FINDINGS: Lungs are clear.  No pleural effusion or pneumothorax. The heart is normal in size. Mild degenerative changes of the visualized thoracolumbar spine. IMPRESSION: Normal chest radiographs. Electronically Signed   By: Julian Hy M.D.   On: 05/20/2017 22:44   Ct Head Wo Contrast  Result Date: 05/21/2017 CLINICAL DATA:  Headache and generalize weakness for 2 days. EXAM: CT HEAD WITHOUT CONTRAST TECHNIQUE: Contiguous axial images were obtained from the base of the skull through the vertex without intravenous contrast. COMPARISON:  12/04/2016 FINDINGS: Brain: No evidence of acute infarction, hemorrhage, hydrocephalus, extra-axial collection or mass lesion/mass effect. Vascular: No hyperdense vessel or unexpected calcification. Skull: Normal. Negative for fracture  or focal lesion. Sinuses/Orbits: No acute finding. Other: None. IMPRESSION: No acute intracranial abnormalities. Electronically Signed   By: Lucienne Capers M.D.   On: 05/21/2017 03:03   Ct Angio Chest Pe W Or Wo Contrast  Result Date: 05/21/2017 CLINICAL DATA:  Sternal pain for 2 days.  History of COPD. EXAM: CT ANGIOGRAPHY CHEST WITH CONTRAST TECHNIQUE: Multidetector CT imaging of the chest was performed using the standard protocol during bolus administration of intravenous contrast. Multiplanar CT image reconstructions and MIPs were obtained to evaluate the vascular anatomy. CONTRAST:  148mL ISOVUE-370 IOPAMIDOL (ISOVUE-370) INJECTION 76% COMPARISON:  None. FINDINGS: Cardiovascular: Good opacification of the central and segmental pulmonary arteries. No focal filling defects. No evidence of significant pulmonary embolus. Normal heart size. No pericardial effusion. Normal caliber thoracic aorta. No aortic dissection. Great vessel origins are patent. Coronary artery calcifications. Mediastinum/Nodes: No enlarged mediastinal, hilar, or axillary lymph nodes. Thyroid gland, trachea, and esophagus demonstrate no significant findings. Lungs/Pleura: Emphysematous changes in the lungs. Bronchial wall thickening consistent with chronic bronchitis. No airspace disease or consolidation. No pleural effusions.  No pneumothorax. Airways are patent. Upper Abdomen: No acute process demonstrated in the visualized upper abdomen. Musculoskeletal: Old right third rib deformity. Degenerative changes in the spine. No destructive bone lesions. Review of the MIP images confirms the above findings. IMPRESSION: 1. No evidence of significant pulmonary embolus. 2. Emphysematous changes in the lungs. No evidence of active pulmonary disease. 3. Mild coronary artery calcifications. Emphysema (ICD10-J43.9). Electronically Signed   By: Lucienne Capers M.D.   On: 05/21/2017 03:08   Dg Chest Port 1 View  Result Date: 05/28/2017 CLINICAL DATA:   Shortness of breath.  COPD.  Former smoker. EXAM: PORTABLE CHEST 1 VIEW COMPARISON:  05/24/2017. FINDINGS: Normal cardiomediastinal silhouette. Hyperinflation without consolidation or edema. Chronic LEFT CP angle blunting. Mild basilar scarring. No pneumothorax. No acute osseous findings. Chronic rib fractures. IMPRESSION: COPD.  No active disease. Electronically Signed   By: Staci Righter M.D.   On: 05/28/2017 23:14   Dg Chest Port 1 View  Result Date: 05/24/2017 CLINICAL DATA:  Central chest pain and shortness of breath for 3 days. EXAM: PORTABLE CHEST 1 VIEW COMPARISON:  Chest CT 05/21/2017, chest radiograph 05/20/2017 FINDINGS: Cardiomediastinal silhouette is normal. Mediastinal contours appear intact. There is no evidence of focal airspace consolidation, pleural effusion or pneumothorax. Emphysematous changes with hyperinflation of the lungs again seen. Osseous structures are without acute abnormality. Soft tissues are grossly normal. IMPRESSION: Emphysematous changes with hyperinflation of the lungs. No evidence of focal consolidation. Electronically Signed   By: Fidela Salisbury M.D.   On: 05/24/2017 17:20   Dg Chest Port 1 View  Result Date: 05/13/2017 CLINICAL DATA:  Shortness of breath EXAM: PORTABLE CHEST 1 VIEW COMPARISON:  April 29, 2017 FINDINGS: There is relative lucency in the upper lung zones, particular on the right, suggesting underlying bullous disease. There is no edema or consolidation. Heart size and normal. Somewhat diminished vascularity in the upper lobes is suggestive of potential underlying bullous disease. No adenopathy. There old healed posterior rib fractures on the right superiorly. IMPRESSION: Suspect upper lobe bullous disease, particular on the right. No edema or consolidation. Electronically Signed   By: Lowella Grip III M.D.   On: 05/13/2017 07:51    Microbiology: No results found for this or any previous visit (from the past 240 hour(s)).   Labs: Basic  Metabolic Panel: Recent Labs  Lab 05/28/17 2217 05/28/17 2218 05/29/17 0308 05/30/17 0313 05/31/17 0241 06/01/17 0300  NA 138 138 140 139 138 139  K 4.4 4.3 4.6 4.9 5.2* 4.4  CL 96* 94* 98* 95* 92* 92*  CO2 36*  --  34* 38* 40* 38*  GLUCOSE 258* 254* 260* 207* 192* 189*  BUN 10 14 13 14 17 14   CREATININE 0.66 0.70 0.69 0.61 0.71 0.64  CALCIUM 8.3*  --  8.1* 8.5* 8.6* 8.6*   Liver Function Tests: No results for input(s): AST, ALT, ALKPHOS, BILITOT, PROT, ALBUMIN in the last 168 hours. No results for input(s): LIPASE, AMYLASE in the last 168 hours. No results for input(s): AMMONIA in the last 168 hours. CBC: Recent Labs  Lab 05/28/17 2217 05/28/17 2218  WBC 9.9  --   NEUTROABS 7.5  --   HGB 13.9 14.3  HCT 44.1 42.0  MCV 96.7  --   PLT 213  --    Cardiac Enzymes: No results for input(s): CKTOTAL, CKMB, CKMBINDEX, TROPONINI in the last 168 hours. BNP: BNP (last 3 results) Recent Labs    04/05/17 2241 05/23/17 1052 05/28/17 2217  BNP 43.0  93.4 28.9    ProBNP (last 3 results) No results for input(s): PROBNP in the last 8760 hours.  CBG: Recent Labs  Lab 06/01/17 1154 06/01/17 1334 06/01/17 1510 06/01/17 1608 06/02/17 0732  GLUCAP 248* 317* 201* 118* 137*       Signed:  Nita Sells MD   [facility] 06/02/2017, 11:10 AM

## 2017-06-03 LAB — GLUCOSE, CAPILLARY: Glucose-Capillary: 282 mg/dL — ABNORMAL HIGH (ref 65–99)

## 2017-06-05 ENCOUNTER — Encounter (HOSPITAL_COMMUNITY): Payer: Self-pay | Admitting: Emergency Medicine

## 2017-06-05 ENCOUNTER — Emergency Department (HOSPITAL_COMMUNITY)
Admission: EM | Admit: 2017-06-05 | Discharge: 2017-06-06 | Disposition: A | Discharge status: Home / Self Care | Payer: Medicaid Other | Attending: Emergency Medicine | Admitting: Emergency Medicine

## 2017-06-05 ENCOUNTER — Other Ambulatory Visit: Payer: Self-pay

## 2017-06-05 DIAGNOSIS — R05 Cough: Secondary | ICD-10-CM | POA: Insufficient documentation

## 2017-06-05 DIAGNOSIS — R0602 Shortness of breath: Secondary | ICD-10-CM | POA: Diagnosis present

## 2017-06-05 DIAGNOSIS — Z79899 Other long term (current) drug therapy: Secondary | ICD-10-CM | POA: Diagnosis not present

## 2017-06-05 DIAGNOSIS — Z8546 Personal history of malignant neoplasm of prostate: Secondary | ICD-10-CM | POA: Insufficient documentation

## 2017-06-05 DIAGNOSIS — Z85038 Personal history of other malignant neoplasm of large intestine: Secondary | ICD-10-CM | POA: Insufficient documentation

## 2017-06-05 DIAGNOSIS — J441 Chronic obstructive pulmonary disease with (acute) exacerbation: Secondary | ICD-10-CM | POA: Insufficient documentation

## 2017-06-05 DIAGNOSIS — Z87891 Personal history of nicotine dependence: Secondary | ICD-10-CM | POA: Diagnosis not present

## 2017-06-05 NOTE — ED Notes (Signed)
Bed: WA24 Expected date:  Expected time:  Means of arrival:  Comments: ems 

## 2017-06-05 NOTE — ED Triage Notes (Signed)
Patient arrives by [facility] with complaints of shortness of breath/COPD and O2 dependent 3l/[place]-started 2 days ago-chest tightness-states his normal COPD chest tightness. Patient states he was diagnosed with prostate cancer 1 year ago but has been incarcerated and has not had treatment-states having constipation. Patient has had albuterol 10 mg and atrovent 1 mg and solumedrol 125 mg IV.

## 2017-06-06 ENCOUNTER — Emergency Department (HOSPITAL_COMMUNITY): Payer: Medicaid Other

## 2017-06-06 LAB — I-STAT CHEM 8, ED
BUN: 9 mg/dL (ref 6–20)
CHLORIDE: 94 mmol/L — AB (ref 101–111)
Calcium, Ion: 1.1 mmol/L — ABNORMAL LOW (ref 1.15–1.40)
Creatinine, Ser: 0.6 mg/dL — ABNORMAL LOW (ref 0.61–1.24)
Glucose, Bld: 187 mg/dL — ABNORMAL HIGH (ref 65–99)
HCT: 37 % — ABNORMAL LOW (ref 39.0–52.0)
Hemoglobin: 12.6 g/dL — ABNORMAL LOW (ref 13.0–17.0)
POTASSIUM: 4.4 mmol/L (ref 3.5–5.1)
Sodium: 138 mmol/L (ref 135–145)
TCO2: 38 mmol/L — ABNORMAL HIGH (ref 22–32)

## 2017-06-06 MED ORDER — PREDNISONE 20 MG PO TABS: 60.0000 mg | ORAL_TABLET | Freq: Once | ORAL | Status: DC

## 2017-06-06 MED ORDER — LORAZEPAM 1 MG PO TABS
1.0000 mg | ORAL_TABLET | Freq: Once | ORAL | Status: AC
  Administered 2017-06-06: 1 mg via ORAL
  Filled 2017-06-06: qty 1

## 2017-06-06 NOTE — ED Provider Notes (Signed)
[facility] DEPT Provider Note   CSN: [id number] Arrival date & time: 06/05/17  2313     History   Chief Complaint Chief Complaint  Patient presents with  . Shortness of Breath  . Prostate cancer/Constipation    HPI Taylor [name] is a 62 y.o. male.  The history is provided by the patient.  Shortness of Breath  This is a new problem. The problem occurs frequently.The problem has been gradually worsening. Associated symptoms include cough and wheezing. Pertinent negatives include no fever, no hemoptysis and no vomiting. Associated symptoms comments: Chest tightness . He has had prior hospitalizations. He has had prior ED visits. Associated medical issues include COPD.  Patient with history of  bipolar, COPD, previous colon cancer, prostate cancer is not undergoing therapy, presents with increasing cough and shortness of breath.  He also reports chest tightness from his COPD.  He reports when he goes to bed at night he wakes up feeling out of breath.  He reports feeling anxious.  He also reports having difficulty urinating as well as difficulty having bowel movements.  He reports he is able to pass stool, but in small amounts.  No bloody stool.  He also reports passing only small amounts of urine as well  Past Medical History:  Diagnosis Date  . Asthma   . Bipolar 1 disorder ([place]) 02/05/2012  . Colon cancer ([place]) 04/11/11   adenocarcinoma of colon, 7/19 nodes pos.FINISHED CHEMO/DR. SHERRILL  . COPD (chronic obstructive pulmonary disease) ([name])    SMOKER  . Depression   . Emphysema of lung ([place])   . Full dentures   . Hemorrhoids   . On home oxygen therapy    "3L; 24/7" (05/29/2017)  . Pneumonia ~ 2016   "double pneumonia"  . Prostate cancer ([place])    Gleason score = 7, supposed to have radiation therapy but he has not followed up (05/29/2017)  . Rib fractures    hx of    Patient Active Problem List   Diagnosis Date Noted  . SIRS (systemic  inflammatory response syndrome) ([place]) 05/21/2017  . Tobacco abuse 05/13/2017  . HLD (hyperlipidemia) 05/13/2017  . Acute on chronic respiratory failure with hypoxia and hypercapnia ([place]) 04/06/2017  . Leukocytosis 04/06/2017  . Adjustment disorder with depressed mood 03/28/2017  . Adjustment disorder with mixed disturbance of emotions and conduct   . Chronic respiratory failure with hypoxia ([place]) 03/24/2017  . Normocytic normochromic anemia 03/24/2017  . COPD with acute exacerbation ([place]) 10/22/2016  . Alcohol abuse 01/09/2013  . COPD exacerbation ([place]) 01/09/2013  . Chest pain 01/09/2013  . Smoker 12/16/2012  . Inguinal hernia unilateral, non-recurrent, right 05/01/2012  . Dyspepsia 02/05/2012  . Bipolar 1 disorder ([place]) 02/05/2012  . 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 COPD  GOLD III 02/03/2012  . Thrombocytopenia ([place]) 02/03/2012  . Depression   . 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 COLON SURGERY  04/11/11   Sigmoid colectomy  . COLOSTOMY REVISION  04/11/2011   Procedure: COLON RESECTION SIGMOID;  Surgeon: [name], MD;  Location: [facility];  Service: General;  Laterality: N/A;  low anterior colon resection   . HERNIA REPAIR    . INGUINAL HERNIA REPAIR Right 05/01/2012   Procedure: HERNIA REPAIR INGUINAL ADULT;  Surgeon: [name], MD;  Location: [facility];  Service: General;  Laterality: Right;  . INSERTION OF MESH Right 05/01/2012   Procedure: INSERTION OF  MESH;  Surgeon: [name], MD;  Location: [facility];  Service: General;  Laterality: Right;  . PORT-A-CATH REMOVAL Left 05/01/2012   Procedure: REMOVAL Infusion Port;  Surgeon: [name], MD;  Location: [facility];  Service: General;  Laterality: Left;  . PORTACATH PLACEMENT  05/02/2011   Procedure: INSERTION PORT-A-CATH;  Surgeon: [name], MD;  Location: [facility];  Service: General;  Laterality: N/A;  . PROSTATE BIOPSY          Home  Medications    Prior to Admission medications   Medication Sig Start Date End Date Taking? Authorizing Provider  acetaminophen (TYLENOL) 500 MG tablet Take 1 tablet (500 mg total) by mouth every 6 (six) hours as needed (as needed for pain.). 06/02/17   Arrien, Jimmy Picket, MD  albuterol (PROVENTIL) (2.5 MG/3ML) 0.083% nebulizer solution Take 3 mLs (2.5 mg total) by nebulization every 6 (six) hours as needed for wheezing or shortness of breath. Dx: J44.9 06/02/17 07/02/17  Arrien, Jimmy Picket, MD  ARIPiprazole (ABILIFY) 5 MG tablet Take 1 tablet (5 mg total) by mouth every morning. 05/18/17   Nita Sells, MD  escitalopram (LEXAPRO) 20 MG tablet Take 1 tablet (20 mg total) by mouth every morning. 05/18/17   Nita Sells, MD  fluticasone (FLOVENT HFA) 110 MCG/ACT inhaler Inhale 2 puffs into the lungs 2 (two) times daily. 06/02/17 08/01/17  Arrien, Jimmy Picket, MD  lovastatin (MEVACOR) 20 MG tablet Take 1 tablet (20 mg total) by mouth at bedtime. 05/18/17   Nita Sells, MD  mometasone-formoterol (DULERA) 100-5 MCG/ACT AERO Inhale 2 puffs into the lungs 2 (two) times daily. 05/18/17   Nita Sells, MD  OXYGEN Inhale 3 L into the lungs.     [provider]  tiotropium (SPIRIVA HANDIHALER) 18 MCG inhalation capsule Place 18 mcg into inhaler and inhale daily.    [provider]    Family History Family History  Problem Relation Age of Onset  . Heart disease Father   . Lung cancer Maternal Uncle        smoked    Social History Social History   Tobacco Use  . Smoking status: Former Smoker    Packs/day: 0.10    Years: 46.00    Pack years: 4.60    Types: Cigarettes    Last attempt to quit: 05/21/2017    Years since quitting: 0.0  . Smokeless tobacco: Former Systems developer    Types: Chew  Substance Use Topics  . Alcohol use: Not Currently    Comment: h/o use in the past, no h/o heavy use  . Drug use: No     Allergies   Codeine   Review of  Systems Review of Systems  Constitutional: Negative for fever.  Respiratory: Positive for cough, shortness of breath and wheezing. Negative for hemoptysis.   Gastrointestinal: Positive for constipation. Negative for blood in stool and vomiting.  Psychiatric/Behavioral: Positive for sleep disturbance. The patient is nervous/anxious.   All other systems reviewed and are negative.    Physical Exam Updated Vital Signs BP (!) 141/89 (BP Location: Right Arm)   Pulse 99   Temp (!) 97.5 F (36.4 C) (Oral)   Resp (!) 26   Ht 1.803 m (5\' 11" )   Wt 68 kg (150 lb)   SpO2 100%   BMI 20.92 kg/m   Physical Exam CONSTITUTIONAL: Elderly mildly anxious HEAD: Normocephalic/atraumatic EYES: EOMI/PERRL ENMT: Mucous membranes moist NECK: supple no meningeal signs SPINE/BACK:entire spine nontender CV: S1/S2 noted, no murmurs/rubs/gallops  noted LUNGS: Tachypnea, decreased breath sounds bilaterally, no distress ABDOMEN: soft, nontender, no rebound or guarding, bowel sounds noted, no focal tenderness throughout abdomen GU:no cva tenderness NEURO: Pt is awake/alert/appropriate, moves all extremitiesx4.  No facial droop.   EXTREMITIES: pulses normal/equal, full ROM, no lower extremity edema/ tenderness SKIN: warm, color normal PSYCH: anxious  ED Treatments / Results  Labs (all labs ordered are listed, but only abnormal results are displayed) Labs Reviewed  I-STAT CHEM 8, ED - Abnormal; Notable for the following components:      Result Value   Chloride 94 (*)    Creatinine, Ser 0.60 (*)    Glucose, Bld 187 (*)    Calcium, Ion 1.10 (*)    TCO2 38 (*)    Hemoglobin 12.6 (*)    HCT 37.0 (*)    All other components within normal limits    EKG None  Radiology Dg Chest 2 View  Result Date: 06/06/2017 CLINICAL DATA:  62 y/o M; 2 days of shortness of breath. History of COPD. EXAM: CHEST - 2 VIEW COMPARISON:  05/28/2017 chest radiograph. FINDINGS: Normal cardiac silhouette. Aortic  atherosclerosis with calcification. Hyperinflated lungs and emphysema. No focal consolidation. No pleural effusion or pneumothorax. Multiple chronic right-sided rib fractures are stable. No acute osseous abnormality identified. IMPRESSION: Hyperinflated lungs and emphysema. No acute pulmonary process identified. Electronically Signed   By: Kristine Garbe M.D.   On: 06/06/2017 01:01    Procedures Procedures    Medications Ordered in ED Medications  LORazepam (ATIVAN) tablet 1 mg (1 mg Oral Given 06/06/17 0107)     Initial Impression / Assessment and Plan / ED Course  I have reviewed the triage vital signs and the nursing notes.  Pertinent labs & imaging results that were available during my care of the patient were reviewed by me and considered in my medical decision making (see chart for details).     12:11 AM Patient here with COPD exacerbation as well as difficulty urinating and constipation.  Overall he is well-appearing.  He is just finished up antibiotics and recent admission for COPD exacerbation. He reports feeling very anxious when he wakes up in the morning, that is his main concern.  He reports chest tightness similar to prior COPD exacerbations We will treat for COPD, will also evaluate for any urinary retention due to difficulty urinating 2:59 AM Patient improved at this time.  Chest x-ray was negative.  EKG is unchanged. His work of breathing is improved.  He is in no distress resting on his home oxygen. Strong suspicion there was an anxiety component to this as well He was just in the hospital for COPD.  I do not feel repeat admission is warranted at this time Of note, he was able to urinate on his own.  No signs of significant urinary retention.  No focal abdominal pain at this time either We will defer steroids as he had recent increase in his glucose from that.   He has follow-up with PCP next week  Final Clinical Impressions(s) / ED Diagnoses   Final  diagnoses:  COPD exacerbation [facility])    ED Discharge Orders    None       Ripley Fraise, MD 06/06/17 0300

## 2017-06-07 ENCOUNTER — Observation Stay (HOSPITAL_COMMUNITY)
Admission: EM | Admit: 2017-06-07 | Discharge: 2017-06-09 | Disposition: A | Discharge status: Home / Self Care | Payer: Medicaid Other | Attending: Family Medicine | Admitting: Family Medicine

## 2017-06-07 ENCOUNTER — Encounter (HOSPITAL_COMMUNITY): Payer: Self-pay | Admitting: Emergency Medicine

## 2017-06-07 ENCOUNTER — Emergency Department (HOSPITAL_COMMUNITY): Payer: Medicaid Other

## 2017-06-07 DIAGNOSIS — Z9049 Acquired absence of other specified parts of digestive tract: Secondary | ICD-10-CM | POA: Diagnosis not present

## 2017-06-07 DIAGNOSIS — T380X5A Adverse effect of glucocorticoids and synthetic analogues, initial encounter: Secondary | ICD-10-CM | POA: Diagnosis not present

## 2017-06-07 DIAGNOSIS — E785 Hyperlipidemia, unspecified: Secondary | ICD-10-CM | POA: Diagnosis not present

## 2017-06-07 DIAGNOSIS — R300 Dysuria: Secondary | ICD-10-CM | POA: Diagnosis not present

## 2017-06-07 DIAGNOSIS — J9622 Acute and chronic respiratory failure with hypercapnia: Secondary | ICD-10-CM | POA: Diagnosis not present

## 2017-06-07 DIAGNOSIS — R739 Hyperglycemia, unspecified: Secondary | ICD-10-CM | POA: Diagnosis present

## 2017-06-07 DIAGNOSIS — F319 Bipolar disorder, unspecified: Secondary | ICD-10-CM | POA: Diagnosis present

## 2017-06-07 DIAGNOSIS — Z85038 Personal history of other malignant neoplasm of large intestine: Secondary | ICD-10-CM | POA: Diagnosis not present

## 2017-06-07 DIAGNOSIS — Z79899 Other long term (current) drug therapy: Secondary | ICD-10-CM | POA: Insufficient documentation

## 2017-06-07 DIAGNOSIS — F1010 Alcohol abuse, uncomplicated: Secondary | ICD-10-CM | POA: Insufficient documentation

## 2017-06-07 DIAGNOSIS — Z9981 Dependence on supplemental oxygen: Secondary | ICD-10-CM | POA: Diagnosis not present

## 2017-06-07 DIAGNOSIS — F419 Anxiety disorder, unspecified: Secondary | ICD-10-CM | POA: Insufficient documentation

## 2017-06-07 DIAGNOSIS — J9621 Acute and chronic respiratory failure with hypoxia: Secondary | ICD-10-CM | POA: Diagnosis present

## 2017-06-07 DIAGNOSIS — F4321 Adjustment disorder with depressed mood: Secondary | ICD-10-CM | POA: Insufficient documentation

## 2017-06-07 DIAGNOSIS — J439 Emphysema, unspecified: Secondary | ICD-10-CM | POA: Diagnosis not present

## 2017-06-07 DIAGNOSIS — Z8249 Family history of ischemic heart disease and other diseases of the circulatory system: Secondary | ICD-10-CM | POA: Insufficient documentation

## 2017-06-07 DIAGNOSIS — Z885 Allergy status to narcotic agent status: Secondary | ICD-10-CM | POA: Diagnosis not present

## 2017-06-07 DIAGNOSIS — Z9221 Personal history of antineoplastic chemotherapy: Secondary | ICD-10-CM | POA: Diagnosis not present

## 2017-06-07 DIAGNOSIS — Z8546 Personal history of malignant neoplasm of prostate: Secondary | ICD-10-CM | POA: Insufficient documentation

## 2017-06-07 DIAGNOSIS — Z87891 Personal history of nicotine dependence: Secondary | ICD-10-CM | POA: Diagnosis not present

## 2017-06-07 DIAGNOSIS — J441 Chronic obstructive pulmonary disease with (acute) exacerbation: Secondary | ICD-10-CM | POA: Diagnosis present

## 2017-06-07 DIAGNOSIS — K5900 Constipation, unspecified: Secondary | ICD-10-CM

## 2017-06-07 LAB — CBC
HCT: 38.4 % — ABNORMAL LOW (ref 39.0–52.0)
Hemoglobin: 12.4 g/dL — ABNORMAL LOW (ref 13.0–17.0)
MCH: 30.5 pg (ref 26.0–34.0)
MCHC: 32.3 g/dL (ref 30.0–36.0)
MCV: 94.3 fL (ref 78.0–100.0)
PLATELETS: 200 10*3/uL (ref 150–400)
RBC: 4.07 MIL/uL — ABNORMAL LOW (ref 4.22–5.81)
RDW: 13 % (ref 11.5–15.5)
WBC: 7.5 10*3/uL (ref 4.0–10.5)

## 2017-06-07 LAB — BASIC METABOLIC PANEL
Anion gap: 5 (ref 5–15)
BUN: 10 mg/dL (ref 6–20)
CO2: 42 mmol/L — ABNORMAL HIGH (ref 22–32)
CREATININE: 0.71 mg/dL (ref 0.61–1.24)
Calcium: 8.7 mg/dL — ABNORMAL LOW (ref 8.9–10.3)
Chloride: 91 mmol/L — ABNORMAL LOW (ref 101–111)
GFR calc Af Amer: >60 mL/min (ref >60)
GLUCOSE: 302 mg/dL — AB (ref 65–99)
Potassium: 4.3 mmol/L (ref 3.5–5.1)
SODIUM: 138 mmol/L (ref 135–145)

## 2017-06-07 LAB — I-STAT TROPONIN, ED: Troponin i, poc: 0.01 ng/mL (ref 0.00–0.08)

## 2017-06-07 NOTE — ED Triage Notes (Signed)
Per EMS pt SOB 140/84, HR 116, oxygen 96% 3L normally at home, initially seemed panicked, wheezing, duoneb given in route and pt used inhaler at home.

## 2017-06-08 ENCOUNTER — Other Ambulatory Visit: Payer: Self-pay

## 2017-06-08 ENCOUNTER — Encounter (HOSPITAL_COMMUNITY): Payer: Self-pay

## 2017-06-08 DIAGNOSIS — F339 Major depressive disorder, recurrent, unspecified: Secondary | ICD-10-CM | POA: Diagnosis not present

## 2017-06-08 DIAGNOSIS — K5900 Constipation, unspecified: Secondary | ICD-10-CM

## 2017-06-08 DIAGNOSIS — J9622 Acute and chronic respiratory failure with hypercapnia: Secondary | ICD-10-CM | POA: Diagnosis not present

## 2017-06-08 DIAGNOSIS — F319 Bipolar disorder, unspecified: Secondary | ICD-10-CM

## 2017-06-08 DIAGNOSIS — J441 Chronic obstructive pulmonary disease with (acute) exacerbation: Secondary | ICD-10-CM | POA: Diagnosis not present

## 2017-06-08 DIAGNOSIS — J9611 Chronic respiratory failure with hypoxia: Secondary | ICD-10-CM

## 2017-06-08 DIAGNOSIS — E785 Hyperlipidemia, unspecified: Secondary | ICD-10-CM

## 2017-06-08 DIAGNOSIS — R739 Hyperglycemia, unspecified: Secondary | ICD-10-CM

## 2017-06-08 DIAGNOSIS — J9621 Acute and chronic respiratory failure with hypoxia: Secondary | ICD-10-CM | POA: Diagnosis not present

## 2017-06-08 LAB — RESPIRATORY PANEL BY PCR
Adenovirus: NOT DETECTED
BORDETELLA PERTUSSIS-RVPCR: NOT DETECTED
CORONAVIRUS HKU1-RVPPCR: NOT DETECTED
Chlamydophila pneumoniae: NOT DETECTED
Coronavirus 229E: NOT DETECTED
Coronavirus NL63: NOT DETECTED
Coronavirus OC43: NOT DETECTED
Influenza A: NOT DETECTED
Influenza B: NOT DETECTED
Metapneumovirus: NOT DETECTED
Mycoplasma pneumoniae: NOT DETECTED
PARAINFLUENZA VIRUS 3-RVPPCR: NOT DETECTED
PARAINFLUENZA VIRUS 4-RVPPCR: NOT DETECTED
Parainfluenza Virus 1: NOT DETECTED
Parainfluenza Virus 2: NOT DETECTED
RHINOVIRUS / ENTEROVIRUS - RVPPCR: NOT DETECTED
Respiratory Syncytial Virus: NOT DETECTED

## 2017-06-08 LAB — GLUCOSE, CAPILLARY
GLUCOSE-CAPILLARY: 178 mg/dL — AB (ref 65–99)
GLUCOSE-CAPILLARY: 369 mg/dL — AB (ref 65–99)
GLUCOSE-CAPILLARY: 310 mg/dL — AB (ref 65–99)
Glucose-Capillary: 276 mg/dL — ABNORMAL HIGH (ref 65–99)

## 2017-06-08 LAB — STREP PNEUMONIAE URINARY ANTIGEN: Strep Pneumo Urinary Antigen: NEGATIVE

## 2017-06-08 LAB — HIV ANTIBODY (ROUTINE TESTING W REFLEX): HIV Screen 4th Generation wRfx: NONREACTIVE

## 2017-06-08 MED ORDER — ACETAMINOPHEN 500 MG PO TABS
500.0000 mg | ORAL_TABLET | Freq: Four times a day (QID) | ORAL | Status: DC | PRN
  Administered 2017-06-08 (×2): 500 mg via ORAL
  Filled 2017-06-08 (×2): qty 1

## 2017-06-08 MED ORDER — METHYLPREDNISOLONE SODIUM SUCC 125 MG IJ SOLR
60.0000 mg | Freq: Three times a day (TID) | INTRAMUSCULAR | Status: DC
  Administered 2017-06-08 – 2017-06-09 (×4): 60 mg via INTRAVENOUS
  Filled 2017-06-08 (×4): qty 2

## 2017-06-08 MED ORDER — IPRATROPIUM-ALBUTEROL 0.5-2.5 (3) MG/3ML IN SOLN
3.0000 mL | RESPIRATORY_TRACT | Status: DC
  Administered 2017-06-08 – 2017-06-09 (×7): 3 mL via RESPIRATORY_TRACT
  Filled 2017-06-08 (×8): qty 3

## 2017-06-08 MED ORDER — DOXYCYCLINE HYCLATE 100 MG PO TABS
100.0000 mg | ORAL_TABLET | Freq: Two times a day (BID) | ORAL | Status: DC
  Administered 2017-06-08 – 2017-06-09 (×3): 100 mg via ORAL
  Filled 2017-06-08 (×3): qty 1

## 2017-06-08 MED ORDER — ESCITALOPRAM OXALATE 20 MG PO TABS
20.0000 mg | ORAL_TABLET | Freq: Every day | ORAL | Status: DC
  Administered 2017-06-08 – 2017-06-09 (×2): 20 mg via ORAL
  Filled 2017-06-08 (×2): qty 1

## 2017-06-08 MED ORDER — PRAVASTATIN SODIUM 20 MG PO TABS
20.0000 mg | ORAL_TABLET | Freq: Every day | ORAL | Status: DC
  Administered 2017-06-08: 20 mg via ORAL
  Filled 2017-06-08: qty 1

## 2017-06-08 MED ORDER — ENOXAPARIN SODIUM 40 MG/0.4ML ~~LOC~~ SOLN
40.0000 mg | SUBCUTANEOUS | Status: DC
  Administered 2017-06-08: 40 mg via SUBCUTANEOUS
  Filled 2017-06-08: qty 0.4

## 2017-06-08 MED ORDER — INSULIN ASPART 100 UNIT/ML ~~LOC~~ SOLN
0.0000 [IU] | Freq: Every day | SUBCUTANEOUS | Status: DC
  Administered 2017-06-08: 3 [IU] via SUBCUTANEOUS

## 2017-06-08 MED ORDER — LORAZEPAM 1 MG PO TABS
1.0000 mg | ORAL_TABLET | Freq: Once | ORAL | Status: AC
  Administered 2017-06-08: 1 mg via ORAL
  Filled 2017-06-08: qty 1

## 2017-06-08 MED ORDER — SENNA 8.6 MG PO TABS
1.0000 | ORAL_TABLET | Freq: Every day | ORAL | Status: DC
  Filled 2017-06-08: qty 1

## 2017-06-08 MED ORDER — DM-GUAIFENESIN ER 30-600 MG PO TB12: 1.0000 | ORAL_TABLET | Freq: Two times a day (BID) | ORAL | Status: DC | PRN

## 2017-06-08 MED ORDER — ARIPIPRAZOLE 5 MG PO TABS
5.0000 mg | ORAL_TABLET | Freq: Every day | ORAL | Status: DC
  Administered 2017-06-08 – 2017-06-09 (×2): 5 mg via ORAL
  Filled 2017-06-08 (×2): qty 1

## 2017-06-08 MED ORDER — ALUM & MAG HYDROXIDE-SIMETH 200-200-20 MG/5ML PO SUSP
30.0000 mL | ORAL | Status: DC | PRN
  Administered 2017-06-08: 30 mL via ORAL
  Filled 2017-06-08: qty 30

## 2017-06-08 MED ORDER — POLYETHYLENE GLYCOL 3350 17 G PO PACK
17.0000 g | PACK | Freq: Two times a day (BID) | ORAL | Status: DC
  Administered 2017-06-08: 17 g via ORAL
  Filled 2017-06-08 (×3): qty 1

## 2017-06-08 MED ORDER — MOMETASONE FURO-FORMOTEROL FUM 100-5 MCG/ACT IN AERO
2.0000 | INHALATION_SPRAY | Freq: Two times a day (BID) | RESPIRATORY_TRACT | Status: DC
  Administered 2017-06-08 – 2017-06-09 (×3): 2 via RESPIRATORY_TRACT
  Filled 2017-06-08: qty 8.8

## 2017-06-08 MED ORDER — ALBUTEROL SULFATE (2.5 MG/3ML) 0.083% IN NEBU: 2.5000 mg | INHALATION_SOLUTION | RESPIRATORY_TRACT | Status: DC | PRN

## 2017-06-08 MED ORDER — INSULIN ASPART 100 UNIT/ML ~~LOC~~ SOLN
0.0000 [IU] | Freq: Three times a day (TID) | SUBCUTANEOUS | Status: DC
  Administered 2017-06-08: 7 [IU] via SUBCUTANEOUS
  Administered 2017-06-08: 2 [IU] via SUBCUTANEOUS
  Administered 2017-06-08: 7 [IU] via SUBCUTANEOUS
  Administered 2017-06-09: 5 [IU] via SUBCUTANEOUS
  Administered 2017-06-09: 3 [IU] via SUBCUTANEOUS

## 2017-06-08 MED ORDER — ALBUTEROL (5 MG/ML) CONTINUOUS INHALATION SOLN
10.0000 mg/h | INHALATION_SOLUTION | Freq: Once | RESPIRATORY_TRACT | Status: AC
  Administered 2017-06-08: 10 mg/h via RESPIRATORY_TRACT
  Filled 2017-06-08: qty 20

## 2017-06-08 NOTE — H&P (Signed)
History and Physical    Taylor [name] PJK:[id number] DOB: 05-29-1955 DOA: 06/07/2017  Referring MD/NP/PA:   PCP: [facility]   Patient coming from:  The patient is coming from home.  At baseline, pt is independent for most of ADL.   Chief Complaint: Cough, shortness of breath  HPI: Taylor [name] is a 62 y.o. male with medical history significant of COPD on 3 oxygen at home, hyperlipidemia, depression, prostate cancer, who presents with cough, shortness of breath.  Patient was recently hospitalized from 5/9-5/13 due to COPD exacerbation.  Patient states that his shortness breath has worsened in the past several days.  He has dry cough and wheezing.  Denies any chest pain.  He can barely speaking full sentences.  No runny nose or sore throat.  Denies nausea, vomiting, diarrhea, abdominal pain, symptoms of UTI.  His blood sugar is elevated due to steroid use.  ED Course: pt was found to have WBC 7.5, creatinine normal, negative troponin, temperature 99, no tachycardia, has tachypnea, oxygen saturation 92% on 3 L nasal cannula oxygen.  Chest x-ray showed COPD without infiltration.  Patient is admitted to telemetry bed as inpatient.  Review of Systems:   General: no fevers, chills, no body weight gain, has fatigue HEENT: no blurry vision, hearing changes or sore throat Respiratory: has dyspnea, coughing, wheezing CV: no chest pain, no palpitations GI: no nausea, vomiting, abdominal pain, diarrhea, constipation GU: no dysuria, burning on urination, increased urinary frequency, hematuria  Ext: has mild leg edema Neuro: no unilateral weakness, numbness, or tingling, no vision change or hearing loss Skin: no rash, no skin tear. MSK: No muscle spasm, no deformity, no limitation of range of movement in spin Heme: No easy bruising.  Travel history: No recent long distant travel.  Allergy:  Allergies  Allergen Reactions  . Codeine Nausea And Vomiting    Past  Medical History:  Diagnosis Date  . Asthma   . Bipolar 1 disorder ([place]) 02/05/2012  . Colon cancer ([place]) 04/11/11   adenocarcinoma of colon, 7/19 nodes pos.FINISHED CHEMO/DR. SHERRILL  . COPD (chronic obstructive pulmonary disease) ([place])    SMOKER  . Depression   . Emphysema of lung ([place])   . Full dentures   . Hemorrhoids   . On home oxygen therapy    "3L; 24/7" (05/29/2017)  . Pneumonia ~ 2016   "double pneumonia"  . Prostate cancer ([place])    Gleason score = 7, supposed to have radiation therapy but he has not followed up (05/29/2017)  . Rib fractures    hx of    Past Surgical History:  Procedure Laterality Date  . COLON SURGERY  04/11/11   Sigmoid colectomy  . COLOSTOMY REVISION  04/11/2011   Procedure: COLON RESECTION SIGMOID;  Surgeon: [name], MD;  Location: [facility];  Service: General;  Laterality: N/A;  low anterior colon resection   . HERNIA REPAIR    . INGUINAL HERNIA REPAIR Right 05/01/2012   Procedure: HERNIA REPAIR INGUINAL ADULT;  Surgeon: [name], MD;  [facility]: [facility];  Service: General;  Laterality: Right;  . INSERTION OF MESH Right 05/01/2012   Procedure: INSERTION OF MESH;  Surgeon: [name], MD;  [facility];  Service: General;  Laterality: Right;  . PORT-A-CATH REMOVAL Left 05/01/2012   Procedure: REMOVAL Infusion Port;  Surgeon: [name], MD;  Location: [facility];  Service: General;  Laterality: Left;  . PORTACATH PLACEMENT  05/02/2011   Procedure:  INSERTION PORT-A-CATH;  Surgeon: [name], MD;  Location: [facility];  Service: General;  Laterality: N/A;  . PROSTATE BIOPSY      Social History:  reports that he quit smoking about 2 weeks ago. His smoking use included cigarettes. He has a 4.60 pack-year smoking history. He has quit using smokeless tobacco. His smokeless tobacco use included chew. He reports that he drank alcohol. He reports that he does not use drugs.  Family History:  Family History  Problem Relation Age of Onset  . Heart  disease Father   . Lung cancer Maternal Uncle        smoked     Prior to Admission medications   Medication Sig Start Date End Date Taking? Authorizing Provider  acetaminophen (TYLENOL) 500 MG tablet Take 1 tablet (500 mg total) by mouth every 6 (six) hours as needed (as needed for pain.). 06/02/17  Yes Arrien, Jimmy Picket, MD  albuterol (PROVENTIL) (2.5 MG/3ML) 0.083% nebulizer solution Take 3 mLs (2.5 mg total) by nebulization every 6 (six) hours as needed for wheezing or shortness of breath. Dx: J44.9 06/02/17 07/02/17 Yes Arrien, Jimmy Picket, MD  ARIPiprazole (ABILIFY) 5 MG tablet Take 1 tablet (5 mg total) by mouth every morning. Patient taking differently: Take 5 mg by mouth daily.  05/18/17  Yes Nita Sells, MD  escitalopram (LEXAPRO) 20 MG tablet Take 1 tablet (20 mg total) by mouth every morning. Patient taking differently: Take 20 mg by mouth daily.  05/18/17  Yes Nita Sells, MD  fluticasone (FLOVENT HFA) 110 MCG/ACT inhaler Inhale 2 puffs into the lungs 2 (two) times daily. 06/02/17 08/01/17 Yes Arrien, Jimmy Picket, MD  lovastatin (MEVACOR) 20 MG tablet Take 1 tablet (20 mg total) by mouth at bedtime. 05/18/17  Yes Nita Sells, MD  mometasone-formoterol (DULERA) 100-5 MCG/ACT AERO Inhale 2 puffs into the lungs 2 (two) times daily. 05/18/17  Yes Nita Sells, MD  tiotropium (SPIRIVA HANDIHALER) 18 MCG inhalation capsule Place 18 mcg into inhaler and inhale daily.   Yes [provider]  OXYGEN Inhale 3 L into the lungs.     [provider]    Physical Exam: Vitals:   06/08/17 0130 06/08/17 0230 06/08/17 0400 06/08/17 0544  BP: 104/89 125/85 134/74 121/73  Pulse: (!) 107 76 77 93  Resp: 20 (!) 24 (!) 22 18  Temp:    97.7 F (36.5 C)  TempSrc:      SpO2: 97% 99% 97% 95%  Weight:      Height:       General: Not in acute distress HEENT:       Eyes: PERRL, EOMI, no scleral icterus.       ENT: No discharge from the ears  and nose, no pharynx injection, no tonsillar enlargement.        Neck: No JVD, no bruit, no mass felt. Heme: No neck lymph node enlargement. Cardiac: S1/S2, RRR, No murmurs, No gallops or rubs. Respiratory: Severely decreased air movement bilaterally. No rales, wheezing, rhonchi or rubs. GI: Soft, nondistended, nontender, no rebound pain, no organomegaly, BS present. GU: No hematuria Ext: Trace leg edema bilaterally. 2+DP/PT pulse bilaterally. Musculoskeletal: No joint deformities, No joint redness or warmth, no limitation of ROM in spin. Skin: No rashes.  Neuro: Alert, oriented X3, cranial nerves II-XII grossly intact, moves all extremities normally.  Psych: Patient is not psychotic, no suicidal or hemocidal ideation.  Labs on Admission: I have personally reviewed following labs and imaging studies  CBC: Recent  Labs  Lab 06/06/17 0119 06/07/17 2258  WBC  --  7.5  HGB 12.6* 12.4*  HCT 37.0* 38.4*  MCV  --  94.3  PLT  --  272   Basic Metabolic Panel: Recent Labs  Lab 06/06/17 0119 06/07/17 2258  NA 138 138  K 4.4 4.3  CL 94* 91*  CO2  --  42*  GLUCOSE 187* 302*  BUN 9 10  CREATININE 0.60* 0.71  CALCIUM  --  8.7*   GFR: Estimated Creatinine Clearance: 92.1 mL/min (by C-G formula based on SCr of 0.71 mg/dL). Liver Function Tests: No results for input(s): AST, ALT, ALKPHOS, BILITOT, PROT, ALBUMIN in the last 168 hours. No results for input(s): LIPASE, AMYLASE in the last 168 hours. No results for input(s): AMMONIA in the last 168 hours. Coagulation Profile: No results for input(s): INR, PROTIME in the last 168 hours. Cardiac Enzymes: No results for input(s): CKTOTAL, CKMB, CKMBINDEX, TROPONINI in the last 168 hours. BNP (last 3 results) No results for input(s): PROBNP in the last 8760 hours. HbA1C: No results for input(s): HGBA1C in the last 72 hours. CBG: Recent Labs  Lab 06/01/17 1334 06/01/17 1510 06/01/17 1608 06/01/17 2120 06/02/17 0732  GLUCAP 317* 201*  118* 282* 137*   Lipid Profile: No results for input(s): CHOL, HDL, LDLCALC, TRIG, CHOLHDL, LDLDIRECT in the last 72 hours. Thyroid Function Tests: No results for input(s): TSH, T4TOTAL, FREET4, T3FREE, THYROIDAB in the last 72 hours. Anemia Panel: No results for input(s): VITAMINB12, FOLATE, FERRITIN, TIBC, IRON, RETICCTPCT in the last 72 hours. Urine analysis:    Component Value Date/Time   COLORURINE YELLOW 04/18/2017 0809   APPEARANCEUR CLEAR 04/18/2017 0809   LABSPEC 1.023 04/18/2017 0809   PHURINE 7.0 04/18/2017 0809   GLUCOSEU >=500 (A) 04/18/2017 0809   HGBUR NEGATIVE 04/18/2017 0809   BILIRUBINUR NEGATIVE 04/18/2017 0809   KETONESUR 5 (A) 04/18/2017 0809   PROTEINUR NEGATIVE 04/18/2017 0809   UROBILINOGEN 1.0 01/09/2013 1843   NITRITE NEGATIVE 04/18/2017 0809   LEUKOCYTESUR NEGATIVE 04/18/2017 0809   Sepsis Labs: @LABRCNTIP (procalcitonin:4,lacticidven:4) )No results found for this or any previous visit (from the past 240 hour(s)).   Radiological Exams on Admission: Dg Chest 2 View  Result Date: 06/07/2017 CLINICAL DATA:  Acute onset of shortness of breath and hypoxia. EXAM: CHEST - 2 VIEW COMPARISON:  Chest radiograph performed 06/06/2017 FINDINGS: The lungs are hyperexpanded, with flattening of the hemidiaphragms, likely reflecting COPD. There is no evidence of focal opacification, pleural effusion or pneumothorax. The heart is normal in size; the mediastinal contour is within normal limits. No acute osseous abnormalities are seen. Chronic right-sided rib deformities are noted. IMPRESSION: Findings of COPD; no acute cardiopulmonary process seen. Electronically Signed   By: Garald Balding M.D.   On: 06/07/2017 23:29     EKG: Independently reviewed.  Sinus rhythm, QTC 435, low voltage, LAD, poor R wave progression  Assessment/Plan Principal Problem:   COPD exacerbation (HCC) Active Problems:   Depression   Alcohol abuse   Acute on chronic respiratory failure with  hypoxia and hypercapnia (HCC)   HLD (hyperlipidemia)   Hyperglycemia  Acute on chronic respiratory failure with hypoxia and hypercapnia due to COPD exacerbation [facility]): Chest x-ray is negative for infiltration.  -will admit patient to telemetry bed  -Nebulizers: scheduled Duoneb and prn albuterol -Solu-Medrol 60 mg IV tid -Doxycycline -Mucinex for cough  -Incentive spirometry -Urine S. pneumococcal antigen -Follow up blood culture x2, sputum culture, respiratory virus panel, Flu pcr -Nasal cannula oxygen as  needed to maintain O2 saturation 92% or greater  HLD: -Pravastatin  Steroid-induced hyperglycemia: A1c was 6.5 on 04/29/2017, meet criteria for diabetes. -SSI  Depression: Stable, no suicidal or homicidal ideations. -Continue home medications:   Alcohol abuse: Patient states that he did not drink alcohol for long time.  No signs of withdrawal -observe closely for any signs of withdrawal    DVT ppx:  SQ Lovenox Code Status: DO NOT INTUBATE Family Communication: None at bed side.  Disposition Plan:  Anticipate discharge back to previous home environment Consults called: none  Admission status:   Inpatient/tele     Date of Service 06/08/2017    [name] [facility] Pager [phone]  If 7PM-7AM, please contact night-coverage [email or web address] Password [facility] 06/08/2017, 7:27 AM

## 2017-06-08 NOTE — Progress Notes (Signed)
  PROGRESS NOTE  Taylor [name] IWL:[id number] DOB: 1955-09-26 DOA: 06/07/2017 PCP: [facility]  Brief Narrative: 62 year old man PMH COPD, chronic hypoxic respiratory failure on 3 L nasal cannula presented to the emergency department for the second time in 24 hours for shortness of breath, was admitted for COPD exacerbation.  Assessment/Plan COPD exacerbation.  Chest x-ray negative. --Improved but not back to baseline.  Continue steroids, bronchodilators, supplemental oxygen  Chronic hypoxic respiratory failure on 3L [place] --Stable.  Steroid-induced hyperglycemia.  Hemoglobin A1c 6.5 on 4/9. --Close follow-up as an outpatient.  If remains elevated off steroids, consider starting metformin. --Continue sliding scale insulin.  Constipation --Bowel regimen  Dysuria --Urinalysis.  Bipolar disorder, anxiety --Mildly anxious.  Continue Abilify, Lexapro.  PMH prostate cancer   Discontinue telemetry  DVT prophylaxis: Enoxaparin Code Status: DO NOT INTUBATE Family Communication: None Disposition Plan: Home   Murray Hodgkins, MD  [facility] Direct contact: [phone] --Via amion app OR  --[email or web address]; password [email or web address]  7PM-7AM contact night coverage as above 06/08/2017, 9:29 AM  LOS: 0 days   Consultants:    Procedures:    Antimicrobials:    Interval history/Subjective: Feels better but not back to baseline. Still short of breath. Coughing. No nausea or vomiting  Objective: Vitals:  Vitals:   06/08/17 0544 06/08/17 0744  BP: 121/73   Pulse: 93   Resp: 18   Temp: 97.7 F (36.5 C)   SpO2: 95% 96%    Exam:  Constitutional:  . Appears calm.  Mildly uncomfortable. Eyes:  . pupils and irises appear normal . Normal lids  ENMT:  . grossly normal hearing  Respiratory:  . Poor air movement.  Some wheezing.  No rhonchi rales. Marland Kitchen Respiratory effort mildly increased. Cardiovascular:  . RRR, no m/r/g . No LE extremity edema    Psychiatric:  . Mental status o Mood, affect appropriate  I have personally reviewed the following:   Labs:  CBG stable  No labs this a.m.  Troponin negative.  BMP unremarkable on admission except for hyperglycemia.  Hemoglobin stable 12.4, remainder CBC unremarkable  Imaging studies:  Chest x-ray independently reviewed hyperinflated lungs, COPD.  Medical tests:  EKG independently reviewed sinus rhythm, no acute changes.  Scheduled Meds: . ARIPiprazole  5 mg Oral Daily  . doxycycline  100 mg Oral Q12H  . enoxaparin (LOVENOX) injection  40 mg Subcutaneous Q24H  . escitalopram  20 mg Oral Daily  . insulin aspart  0-5 Units Subcutaneous QHS  . insulin aspart  0-9 Units Subcutaneous TID WC  . ipratropium-albuterol  3 mL Nebulization Q4H  . methylPREDNISolone (SOLU-MEDROL) injection  60 mg Intravenous Q8H  . mometasone-formoterol  2 puff Inhalation BID  . pravastatin  20 mg Oral q1800   Continuous Infusions:  Principal Problem:   COPD exacerbation (HCC) Active Problems:   Bipolar 1 disorder (HCC)   Chronic respiratory failure with hypoxia (HCC)   HLD (hyperlipidemia)   Hyperglycemia   Constipation   LOS: 0 days

## 2017-06-08 NOTE — ED Provider Notes (Signed)
[facility] EMERGENCY DEPARTMENT Provider Note   CSN: [id number] Arrival date & time: 06/07/17  2246     History   Chief Complaint Chief Complaint  Patient presents with  . Shortness of Breath    HPI Taylor [name] is a 62 y.o. male.  Patient presents for evaluation of shortness of breath.  Patient reports that he was just discharged several days ago, since leaving the hospital his breathing has worsened.  He reports a tightness in his chest and severe shortness of breath.  He is normally on 3 L nasal cannula oxygen secondary to COPD.  He has been administered multiple nebulizer treatments tonight with some improvement.  He still feels tightness and discomfort in his chest.     Past Medical History:  Diagnosis Date  . Asthma   . Bipolar 1 disorder ([place]) 02/05/2012  . Colon cancer ([place]) 04/11/11   adenocarcinoma of colon, 7/19 nodes pos.FINISHED CHEMO/DR. SHERRILL  . COPD (chronic obstructive pulmonary disease) ([place])    SMOKER  . Depression   . Emphysema of lung ([place])   . Full dentures   . Hemorrhoids   . On home oxygen therapy    "3L; 24/7" (05/29/2017)  . Pneumonia ~ 2016   "double pneumonia"  . Prostate cancer ([place])    Gleason score = 7, supposed to have radiation therapy but he has not followed up (05/29/2017)  . Rib fractures    hx of    Patient Active Problem List   Diagnosis Date Noted  . SIRS (systemic inflammatory response syndrome) ([place]) 05/21/2017  . Tobacco abuse 05/13/2017  . HLD (hyperlipidemia) 05/13/2017  . Acute on chronic respiratory failure with hypoxia and hypercapnia ([place]) 04/06/2017  . Leukocytosis 04/06/2017  . Adjustment disorder with depressed mood 03/28/2017  . Adjustment disorder with mixed disturbance of emotions and conduct   . Chronic respiratory failure with hypoxia ([place]) 03/24/2017  . Normocytic normochromic anemia 03/24/2017  . COPD with acute exacerbation ([place]) 10/22/2016  . Alcohol abuse 01/09/2013  . COPD  exacerbation ([place]) 01/09/2013  . Chest pain 01/09/2013  . Smoker 12/16/2012  . Inguinal hernia unilateral, non-recurrent, right 05/01/2012  . Dyspepsia 02/05/2012  . Bipolar 1 disorder ([place]) 02/05/2012  . 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 COPD  GOLD III 02/03/2012  . Thrombocytopenia ([place]) 02/03/2012  . Depression   . 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 COLON SURGERY  04/11/11   Sigmoid colectomy  . COLOSTOMY REVISION  04/11/2011   Procedure: COLON RESECTION SIGMOID;  Surgeon: [name], MD;  Location: [facility];  Service: General;  Laterality: N/A;  low anterior colon resection   . HERNIA REPAIR    . INGUINAL HERNIA REPAIR Right 05/01/2012   Procedure: HERNIA REPAIR INGUINAL ADULT;  Surgeon: [name], MD;  Location[facility];  Service: General;  Laterality: Right;  . INSERTION OF MESH Right 05/01/2012   Procedure: INSERTION OF MESH;  Surgeon: [name], MD;  [facility];  Service: General;  Laterality: Right;  . PORT-A-CATH REMOVAL Left 05/01/2012   Procedure: REMOVAL Infusion Port;  Surgeon: [name], MD;  Location: [facility];  Service: General;  Laterality: Left;  . PORTACATH PLACEMENT  05/02/2011   Procedure: INSERTION PORT-A-CATH;  Surgeon: [name], MD;  Location[facility];  Service: General;  Laterality: N/A;  . PROSTATE BIOPSY          Home Medications  Prior to Admission medications   Medication Sig Start Date End Date Taking? Authorizing Provider  acetaminophen (TYLENOL) 500 MG tablet Take 1 tablet (500 mg total) by mouth every 6 (six) hours as needed (as needed for pain.). 06/02/17  Yes Arrien, Jimmy Picket, MD  albuterol (PROVENTIL) (2.5 MG/3ML) 0.083% nebulizer solution Take 3 mLs (2.5 mg total) by nebulization every 6 (six) hours as needed for wheezing or shortness of breath. Dx: J44.9 06/02/17 07/02/17 Yes Arrien, Jimmy Picket, MD  ARIPiprazole (ABILIFY) 5 MG tablet Take 1  tablet (5 mg total) by mouth every morning. Patient taking differently: Take 5 mg by mouth daily.  05/18/17  Yes Nita Sells, MD  escitalopram (LEXAPRO) 20 MG tablet Take 1 tablet (20 mg total) by mouth every morning. Patient taking differently: Take 20 mg by mouth daily.  05/18/17  Yes Nita Sells, MD  fluticasone (FLOVENT HFA) 110 MCG/ACT inhaler Inhale 2 puffs into the lungs 2 (two) times daily. 06/02/17 08/01/17 Yes Arrien, Jimmy Picket, MD  lovastatin (MEVACOR) 20 MG tablet Take 1 tablet (20 mg total) by mouth at bedtime. 05/18/17  Yes Nita Sells, MD  mometasone-formoterol (DULERA) 100-5 MCG/ACT AERO Inhale 2 puffs into the lungs 2 (two) times daily. 05/18/17  Yes Nita Sells, MD  tiotropium (SPIRIVA HANDIHALER) 18 MCG inhalation capsule Place 18 mcg into inhaler and inhale daily.   Yes [provider]  OXYGEN Inhale 3 L into the lungs.     [provider]    Family History Family History  Problem Relation Age of Onset  . Heart disease Father   . Lung cancer Maternal Uncle        smoked    Social History Social History   Tobacco Use  . Smoking status: Former Smoker    Packs/day: 0.10    Years: 46.00    Pack years: 4.60    Types: Cigarettes    Last attempt to quit: 05/21/2017    Years since quitting: 0.0  . Smokeless tobacco: Former Systems developer    Types: Chew  Substance Use Topics  . Alcohol use: Not Currently    Comment: h/o use in the past, no h/o heavy use  . Drug use: No     Allergies   Codeine   Review of Systems Review of Systems  Respiratory: Positive for chest tightness, shortness of breath and wheezing.   All other systems reviewed and are negative.    Physical Exam Updated Vital Signs BP 125/85   Pulse 76   Temp 99 F (37.2 C) (Oral)   Resp (!) 24   Ht 5\' 11"  (1.803 m)   Wt 68 kg (150 lb)   SpO2 99%   BMI 20.92 kg/m   Physical Exam  Constitutional: He is oriented to person, place, and time. He  appears well-developed and well-nourished. No distress.  HENT:  Head: Normocephalic and atraumatic.  Right Ear: Hearing normal.  Left Ear: Hearing normal.  Nose: Nose normal.  Mouth/Throat: Oropharynx is clear and moist and mucous membranes are normal.  Eyes: Pupils are equal, round, and reactive to light. Conjunctivae and EOM are normal.  Neck: Normal range of motion. Neck supple.  Cardiovascular: Regular rhythm, S1 normal and S2 normal. Tachycardia present. Exam reveals no gallop and no friction rub.  No murmur heard. Pulmonary/Chest: Effort normal. No respiratory distress. He has decreased breath sounds. He has wheezes. He exhibits no tenderness.  Abdominal: Soft. Normal appearance and bowel sounds are normal. There is no hepatosplenomegaly. There is no  tenderness. There is no rebound, no guarding, no tenderness at McBurney's point and negative Murphy's sign. No hernia.  Musculoskeletal: Normal range of motion.  Neurological: He is alert and oriented to person, place, and time. He has normal strength. No cranial nerve deficit or sensory deficit. Coordination normal. GCS eye subscore is 4. GCS verbal subscore is 5. GCS motor subscore is 6.  Skin: Skin is warm, dry and intact. No rash noted. No cyanosis.  Psychiatric: He has a normal mood and affect. His speech is normal and behavior is normal. Thought content normal.  Nursing note and vitals reviewed.    ED Treatments / Results  Labs (all labs ordered are listed, but only abnormal results are displayed) Labs Reviewed  BASIC METABOLIC PANEL - Abnormal; Notable for the following components:      Result Value   Chloride 91 (*)    CO2 42 (*)    Glucose, Bld 302 (*)    Calcium 8.7 (*)    All other components within normal limits  CBC - Abnormal; Notable for the following components:   RBC 4.07 (*)    Hemoglobin 12.4 (*)    HCT 38.4 (*)    All other components within normal limits  I-STAT TROPONIN, ED    EKG None  Radiology Dg  Chest 2 View  Result Date: 06/07/2017 CLINICAL DATA:  Acute onset of shortness of breath and hypoxia. EXAM: CHEST - 2 VIEW COMPARISON:  Chest radiograph performed 06/06/2017 FINDINGS: The lungs are hyperexpanded, with flattening of the hemidiaphragms, likely reflecting COPD. There is no evidence of focal opacification, pleural effusion or pneumothorax. The heart is normal in size; the mediastinal contour is within normal limits. No acute osseous abnormalities are seen. Chronic right-sided rib deformities are noted. IMPRESSION: Findings of COPD; no acute cardiopulmonary process seen. Electronically Signed   By: Garald Balding M.D.   On: 06/07/2017 23:29    Procedures Procedures (including critical care time)  Medications Ordered in ED Medications  albuterol (PROVENTIL,VENTOLIN) solution continuous neb (has no administration in time range)  LORazepam (ATIVAN) tablet 1 mg (1 mg Oral Given 06/08/17 0149)     Initial Impression / Assessment and Plan / ED Course  I have reviewed the triage vital signs and the nursing notes.  Pertinent labs & imaging results that were available during my care of the patient were reviewed by me and considered in my medical decision making (see chart for details).     Patient brought to the emergency department from home by ambulance.  Patient has a history of COPD.  He was hospitalized approximately a week ago for COPD exacerbation.  He has been in the ER one time since then.  Patient is currently on insulin because his blood sugars have been elevated secondary to steroid use.  Blood sugar moderately elevated at arrival, no sign of DKA.  Patient reportedly had significant wheezing for EMS.  Was treated with bronchodilator therapy.  Patient did have some mild anxiety on arrival, however after administration of Ativan he is still tachypneic and requiring increased oxygen.  He is experiencing increased wheezing once again, will be placed on continuous nebulizer treatment  and admit.  Final Clinical Impressions(s) / ED Diagnoses   Final diagnoses:  COPD exacerbation [facility])    ED Discharge Orders    None       Orpah Greek, MD 06/08/17 [phone]

## 2017-06-09 DIAGNOSIS — K5900 Constipation, unspecified: Secondary | ICD-10-CM | POA: Diagnosis not present

## 2017-06-09 DIAGNOSIS — J441 Chronic obstructive pulmonary disease with (acute) exacerbation: Secondary | ICD-10-CM | POA: Diagnosis not present

## 2017-06-09 DIAGNOSIS — J9611 Chronic respiratory failure with hypoxia: Secondary | ICD-10-CM | POA: Diagnosis not present

## 2017-06-09 DIAGNOSIS — R739 Hyperglycemia, unspecified: Secondary | ICD-10-CM | POA: Diagnosis not present

## 2017-06-09 LAB — GLUCOSE, CAPILLARY
GLUCOSE-CAPILLARY: 291 mg/dL — AB (ref 65–99)
GLUCOSE-CAPILLARY: 235 mg/dL — AB (ref 65–99)

## 2017-06-09 MED ORDER — DOXYCYCLINE HYCLATE 100 MG PO TABS: 100.0000 mg | ORAL_TABLET | Freq: Two times a day (BID) | ORAL | 0 refills | Status: DC

## 2017-06-09 MED ORDER — POLYETHYLENE GLYCOL 3350 17 G PO PACK: 17.0000 g | PACK | Freq: Every day | ORAL | 0 refills | Status: DC | PRN

## 2017-06-09 MED ORDER — PREDNISONE 10 MG PO TABS: ORAL_TABLET | ORAL | 0 refills | Status: DC

## 2017-06-09 NOTE — Progress Notes (Signed)
NCM arranged transportation to home with [facility], pt requiring continuous oxygen. Address confirmed with pt prior.  Whitman Hero RN,BSN,CM

## 2017-06-09 NOTE — Progress Notes (Signed)
Nsg Discharge Note  Admit Date:  06/07/2017 Discharge date: 06/09/2017   [name] to be D/C'd home per MD order.  AVS completed.  Copy for chart, and copy for patient signed, and dated. Patient/caregiver able to verbalize understanding.  Discharge Medication: Allergies as of 06/09/2017      Reactions   Codeine Nausea And Vomiting      Medication List    STOP taking these medications   fluticasone 110 MCG/ACT inhaler Commonly known as:  FLOVENT HFA     TAKE these medications   acetaminophen 500 MG tablet Commonly known as:  TYLENOL Take 1 tablet (500 mg total) by mouth every 6 (six) hours as needed (as needed for pain.).   albuterol (2.5 MG/3ML) 0.083% nebulizer solution Commonly known as:  PROVENTIL Take 3 mLs (2.5 mg total) by nebulization every 6 (six) hours as needed for wheezing or shortness of breath. Dx: J44.9   ARIPiprazole 5 MG tablet Commonly known as:  ABILIFY Take 1 tablet (5 mg total) by mouth every morning. What changed:  when to take this   doxycycline 100 MG tablet Commonly known as:  VIBRA-TABS Take 1 tablet (100 mg total) by mouth every 12 (twelve) hours.   escitalopram 20 MG tablet Commonly known as:  LEXAPRO Take 1 tablet (20 mg total) by mouth every morning. What changed:  when to take this   lovastatin 20 MG tablet Commonly known as:  MEVACOR Take 1 tablet (20 mg total) by mouth at bedtime.   mometasone-formoterol 100-5 MCG/ACT Aero Commonly known as:  DULERA Inhale 2 puffs into the lungs 2 (two) times daily.   OXYGEN Inhale 3 L into the lungs.   polyethylene glycol packet Commonly known as:  MIRALAX / GLYCOLAX Take 17 g by mouth daily as needed for mild constipation.   predniSONE 10 MG tablet Commonly known as:  DELTASONE Take 2 tablets (20 mg total) by mouth daily with breakfast for 3 days, THEN 1 tablet (10 mg total) daily with breakfast for 3 days. Start taking on:  06/09/2017   SPIRIVA HANDIHALER 18 MCG inhalation  capsule Generic drug:  tiotropium Place 18 mcg into inhaler and inhale daily.       Discharge Assessment: Vitals:   06/09/17 0818 06/09/17 0819  BP:    Pulse:    Resp:    Temp:    SpO2: 93% 93%   Skin clean, dry and intact without evidence of skin break down, no evidence of skin tears noted. IV catheter discontinued intact. Site without signs and symptoms of complications - no redness or edema noted at insertion site, patient denies c/o pain - only slight tenderness at site.  Dressing with slight pressure applied.  D/c Instructions-Education: Discharge instructions given to patient with verbalized understanding. Discharge education completed with patient including follow up instructions, medication list, d/c activities limitations if indicated, with other d/c instructions as indicated by MD - patient able to verbalize understanding, all questions fully answered. Oxygen is already setup at home from previous history of COPD. Patient instructed to return to ED, call 911, or call MD for any changes in condition.  Patient escorted via [facility] and will transport home.  Eliezer Champagne, RN 06/09/2017 1:03 PM

## 2017-06-09 NOTE — Discharge Summary (Signed)
Physician Discharge Summary  Taylor [name] UXY:[id number] DOB: May 17, 1955 DOA: 06/07/2017  PCP: [facility]  Admit date: 06/07/2017 Discharge date: 06/09/2017  Recommendations for Outpatient Follow-up:   COPD exacerbation.    Chronic hypoxic respiratory failure on 3L [place] --Stable.  Steroid-induced hyperglycemia.  Hemoglobin A1c 6.5 on 4/9. --Close follow-up as an outpatient.  If remains elevated off steroids, consider starting metformin.  Follow-up [facility]. Schedule an appointment as soon as possible for a visit in 1 week(s).   Contact information: [place] [address] [phone]            Discharge Diagnoses:  1. COPD exacerbation 2. Chronic hypoxic respiratory failure on 3L [place] 3. Steroid-induced hyperglycemia 4. Constipation 5. Dysuria 6. Bipolar disorder, anxiety  Discharge Condition: improved Disposition: home  Diet recommendation: regular  Filed Weights   06/07/17 2251  Weight: 68 kg (150 lb)    History of present illness:  62 year old man PMH COPD, chronic hypoxic respiratory failure on 3 L nasal cannula presented to the emergency department for the second time in 24 hours for shortness of breath, was admitted for COPD exacerbation.  Hospital Course:  Patient was treated with standard therapy for COPD with rapid clinical improvement.  He is close to baseline at this point and stable for discharge.  Individual issues as below.  COPD exacerbation.  Chest x-ray negative. --Continues to improve.  Home on short steroid taper, complete doxycycline.  Chronic hypoxic respiratory failure on 3L [place] --Remains stable.  Steroid-induced hyperglycemia.  Hemoglobin A1c 6.5 on 4/9. --Close follow-up as an outpatient.  Should significantly improved with modest dosing of steroids for short period of time. If remains elevated off steroids, consider starting  metformin.  Constipation --Continue bowel regimen  Bipolar disorder, anxiety --Mildly anxious.  Continue Abilify, Lexapro.  Today's assessment: S: No complaints other than some shortness of breath with exertion but this is a chronic finding.  Feels better today. O: Vitals:  Vitals:   06/09/17 0818 06/09/17 0819  BP:    Pulse:    Resp:    Temp:    SpO2: 93% 93%    Constitutional:  . Appears calm and comfortable.  Appears better today. Respiratory:  . Decreased breath sounds bilaterally. Marland Kitchen Respiratory effort normal.  Cardiovascular:  . RRR, no m/r/g . No LE extremity edema   Psychiatric:  . Mental status o Mood, affect appropriate    Discharge Instructions  Discharge Instructions    Activity as tolerated - No restrictions   Complete by:  As directed    Diet general   Complete by:  As directed    Discharge instructions   Complete by:  As directed    Call your physician or seek immediate medical attention for shortness of breath, fever, or worsening of condition.     Allergies as of 06/09/2017      Reactions   Codeine Nausea And Vomiting      Medication List    STOP taking these medications   fluticasone 110 MCG/ACT inhaler Commonly known as:  FLOVENT HFA     TAKE these medications   acetaminophen 500 MG tablet Commonly known as:  TYLENOL Take 1 tablet (500 mg total) by mouth every 6 (six) hours as needed (as needed for pain.).   albuterol (2.5 MG/3ML) 0.083% nebulizer solution Commonly known as:  PROVENTIL Take 3 mLs (2.5 mg total) by nebulization every 6 (six) hours as needed for  wheezing or shortness of breath. Dx: J44.9   ARIPiprazole 5 MG tablet Commonly known as:  ABILIFY Take 1 tablet (5 mg total) by mouth every morning. What changed:  when to take this   doxycycline 100 MG tablet Commonly known as:  VIBRA-TABS Take 1 tablet (100 mg total) by mouth every 12 (twelve) hours.   escitalopram 20 MG tablet Commonly known as:  LEXAPRO Take 1  tablet (20 mg total) by mouth every morning. What changed:  when to take this   lovastatin 20 MG tablet Commonly known as:  MEVACOR Take 1 tablet (20 mg total) by mouth at bedtime.   mometasone-formoterol 100-5 MCG/ACT Aero Commonly known as:  DULERA Inhale 2 puffs into the lungs 2 (two) times daily.   OXYGEN Inhale 3 L into the lungs.   polyethylene glycol packet Commonly known as:  MIRALAX / GLYCOLAX Take 17 g by mouth daily as needed for mild constipation.   predniSONE 10 MG tablet Commonly known as:  DELTASONE Take 2 tablets (20 mg total) by mouth daily with breakfast for 3 days, THEN 1 tablet (10 mg total) daily with breakfast for 3 days. Start taking on:  06/09/2017   SPIRIVA HANDIHALER 18 MCG inhalation capsule Generic drug:  tiotropium Place 18 mcg into inhaler and inhale daily.      Allergies  Allergen Reactions  . Codeine Nausea And Vomiting    The results of significant diagnostics from this hospitalization (including imaging, microbiology, ancillary and laboratory) are listed below for reference.    Significant Diagnostic Studies: Dg Chest 2 View  Result Date: 06/07/2017 CLINICAL DATA:  Acute onset of shortness of breath and hypoxia. EXAM: CHEST - 2 VIEW COMPARISON:  Chest radiograph performed 06/06/2017 FINDINGS: The lungs are hyperexpanded, with flattening of the hemidiaphragms, likely reflecting COPD. There is no evidence of focal opacification, pleural effusion or pneumothorax. The heart is normal in size; the mediastinal contour is within normal limits. No acute osseous abnormalities are seen. Chronic right-sided rib deformities are noted. IMPRESSION: Findings of COPD; no acute cardiopulmonary process seen. Electronically Signed   By: Garald Balding M.D.   On: 06/07/2017 23:29   Dg Chest 2 View  Result Date: 06/06/2017 CLINICAL DATA:  62 y/o M; 2 days of shortness of breath. History of COPD. EXAM: CHEST - 2 VIEW COMPARISON:  05/28/2017 chest radiograph.  FINDINGS: Normal cardiac silhouette. Aortic atherosclerosis with calcification. Hyperinflated lungs and emphysema. No focal consolidation. No pleural effusion or pneumothorax. Multiple chronic right-sided rib fractures are stable. No acute osseous abnormality identified. IMPRESSION: Hyperinflated lungs and emphysema. No acute pulmonary process identified. Electronically Signed   By: Kristine Garbe M.D.   On: 06/06/2017 01:01    Microbiology: Recent Results (from the past 240 hour(s))  Respiratory Panel by PCR     Status: None   Collection Time: 06/08/17  5:00 AM  Result Value Ref Range Status   Adenovirus NOT DETECTED NOT DETECTED Final   Coronavirus 229E NOT DETECTED NOT DETECTED Final   Coronavirus HKU1 NOT DETECTED NOT DETECTED Final   Coronavirus NL63 NOT DETECTED NOT DETECTED Final   Coronavirus OC43 NOT DETECTED NOT DETECTED Final   Metapneumovirus NOT DETECTED NOT DETECTED Final   Rhinovirus / Enterovirus NOT DETECTED NOT DETECTED Final   Influenza A NOT DETECTED NOT DETECTED Final   Influenza B NOT DETECTED NOT DETECTED Final   Parainfluenza Virus 1 NOT DETECTED NOT DETECTED Final   Parainfluenza Virus 2 NOT DETECTED NOT DETECTED Final   Parainfluenza Virus  3 NOT DETECTED NOT DETECTED Final   Parainfluenza Virus 4 NOT DETECTED NOT DETECTED Final   Respiratory Syncytial Virus NOT DETECTED NOT DETECTED Final   Bordetella pertussis NOT DETECTED NOT DETECTED Final   Chlamydophila pneumoniae NOT DETECTED NOT DETECTED Final   Mycoplasma pneumoniae NOT DETECTED NOT DETECTED Final    Comment: Performed at [facility], [place] [address] [place], [place] [address]     Labs: Basic Metabolic Panel: Recent Labs  Lab 06/06/17 0119 06/07/17 2258  NA 138 138  K 4.4 4.3  CL 94* 91*  CO2  --  42*  GLUCOSE 187* 302*  BUN 9 10  CREATININE 0.60* 0.71  CALCIUM  --  8.7*   CBC: Recent Labs  Lab 06/06/17 0119 06/07/17 2258  WBC  --  7.5  HGB 12.6* 12.4*  HCT 37.0*  38.4*  MCV  --  94.3  PLT  --  200    Recent Labs    04/05/17 2241 05/23/17 1052 05/28/17 2217  BNP 43.0 93.4 28.9   CBG: Recent Labs  Lab 06/08/17 0740 06/08/17 1204 06/08/17 1648 06/08/17 2128 06/09/17 0729  GLUCAP 178* 369* 310* 276* 291*    Principal Problem:   COPD exacerbation (HCC) Active Problems:   Bipolar 1 disorder (HCC)   Chronic respiratory failure with hypoxia (HCC)   HLD (hyperlipidemia)   Hyperglycemia   Constipation   Time coordinating discharge: 25 minutes  Signed:  Murray Hodgkins, MD [facility] 06/09/2017, 10:44 AM

## 2017-06-12 ENCOUNTER — Encounter (HOSPITAL_COMMUNITY): Payer: Self-pay | Admitting: Emergency Medicine

## 2017-06-12 ENCOUNTER — Inpatient Hospital Stay (HOSPITAL_COMMUNITY)
Admission: EM | Admit: 2017-06-12 | Discharge: 2017-06-17 | DRG: 191 | Disposition: A | Discharge status: Home / Self Care | Payer: Medicaid Other | Attending: Internal Medicine | Admitting: Internal Medicine

## 2017-06-12 ENCOUNTER — Other Ambulatory Visit: Payer: Self-pay

## 2017-06-12 DIAGNOSIS — F319 Bipolar disorder, unspecified: Secondary | ICD-10-CM | POA: Diagnosis present

## 2017-06-12 DIAGNOSIS — R0902 Hypoxemia: Secondary | ICD-10-CM | POA: Diagnosis present

## 2017-06-12 DIAGNOSIS — Z6822 Body mass index (BMI) 22.0-22.9, adult: Secondary | ICD-10-CM

## 2017-06-12 DIAGNOSIS — Z7952 Long term (current) use of systemic steroids: Secondary | ICD-10-CM

## 2017-06-12 DIAGNOSIS — J441 Chronic obstructive pulmonary disease with (acute) exacerbation: Principal | ICD-10-CM | POA: Diagnosis present

## 2017-06-12 DIAGNOSIS — Z8546 Personal history of malignant neoplasm of prostate: Secondary | ICD-10-CM

## 2017-06-12 DIAGNOSIS — E099 Drug or chemical induced diabetes mellitus without complications: Secondary | ICD-10-CM | POA: Diagnosis present

## 2017-06-12 DIAGNOSIS — D649 Anemia, unspecified: Secondary | ICD-10-CM | POA: Diagnosis present

## 2017-06-12 DIAGNOSIS — Z87891 Personal history of nicotine dependence: Secondary | ICD-10-CM

## 2017-06-12 DIAGNOSIS — E0965 Drug or chemical induced diabetes mellitus with hyperglycemia: Secondary | ICD-10-CM | POA: Diagnosis present

## 2017-06-12 DIAGNOSIS — N401 Enlarged prostate with lower urinary tract symptoms: Secondary | ICD-10-CM | POA: Diagnosis present

## 2017-06-12 DIAGNOSIS — E785 Hyperlipidemia, unspecified: Secondary | ICD-10-CM | POA: Diagnosis present

## 2017-06-12 DIAGNOSIS — T380X5A Adverse effect of glucocorticoids and synthetic analogues, initial encounter: Secondary | ICD-10-CM | POA: Diagnosis present

## 2017-06-12 DIAGNOSIS — Z9119 Patient's noncompliance with other medical treatment and regimen: Secondary | ICD-10-CM

## 2017-06-12 DIAGNOSIS — E440 Moderate protein-calorie malnutrition: Secondary | ICD-10-CM

## 2017-06-12 DIAGNOSIS — I491 Atrial premature depolarization: Secondary | ICD-10-CM | POA: Diagnosis present

## 2017-06-12 DIAGNOSIS — R3911 Hesitancy of micturition: Secondary | ICD-10-CM | POA: Diagnosis present

## 2017-06-12 DIAGNOSIS — Z7951 Long term (current) use of inhaled steroids: Secondary | ICD-10-CM

## 2017-06-12 DIAGNOSIS — Z885 Allergy status to narcotic agent status: Secondary | ICD-10-CM

## 2017-06-12 DIAGNOSIS — Z9981 Dependence on supplemental oxygen: Secondary | ICD-10-CM

## 2017-06-12 DIAGNOSIS — Z85038 Personal history of other malignant neoplasm of large intestine: Secondary | ICD-10-CM

## 2017-06-12 LAB — CBC
HEMATOCRIT: 37.7 % — AB (ref 39.0–52.0)
HEMOGLOBIN: 12.3 g/dL — AB (ref 13.0–17.0)
MCH: 31.1 pg (ref 26.0–34.0)
MCHC: 32.6 g/dL (ref 30.0–36.0)
MCV: 95.2 fL (ref 78.0–100.0)
Platelets: 212 10*3/uL (ref 150–400)
RBC: 3.96 MIL/uL — AB (ref 4.22–5.81)
RDW: 13.3 % (ref 11.5–15.5)
WBC: 9.2 10*3/uL (ref 4.0–10.5)

## 2017-06-12 LAB — I-STAT TROPONIN, ED: Troponin i, poc: 0 ng/mL (ref 0.00–0.08)

## 2017-06-12 MED ORDER — METHYLPREDNISOLONE SODIUM SUCC 125 MG IJ SOLR
125.0000 mg | Freq: Once | INTRAMUSCULAR | Status: AC
  Administered 2017-06-12: 125 mg via INTRAVENOUS
  Filled 2017-06-12: qty 2

## 2017-06-12 MED ORDER — ALBUTEROL SULFATE (2.5 MG/3ML) 0.083% IN NEBU: 5.0000 mg | INHALATION_SOLUTION | Freq: Once | RESPIRATORY_TRACT | Status: DC

## 2017-06-12 MED ORDER — LORAZEPAM 2 MG/ML IJ SOLN
1.0000 mg | Freq: Once | INTRAMUSCULAR | Status: AC
  Administered 2017-06-12: 1 mg via INTRAVENOUS
  Filled 2017-06-12: qty 1

## 2017-06-12 MED ORDER — IPRATROPIUM BROMIDE 0.02 % IN SOLN
1.0000 mg | Freq: Once | RESPIRATORY_TRACT | Status: AC
  Administered 2017-06-12: 1 mg via RESPIRATORY_TRACT
  Filled 2017-06-12: qty 5

## 2017-06-12 MED ORDER — ALBUTEROL (5 MG/ML) CONTINUOUS INHALATION SOLN
10.0000 mg | INHALATION_SOLUTION | RESPIRATORY_TRACT | Status: DC
Start: 2017-06-12 — End: 2017-06-13
  Administered 2017-06-12: 10 mg via RESPIRATORY_TRACT
  Filled 2017-06-12: qty 20

## 2017-06-12 NOTE — ED Notes (Signed)
Respiratory called

## 2017-06-12 NOTE — ED Triage Notes (Signed)
Patient arrived from [facility]. Complaints of increasing shortness of breath. History of COPD. Reports taking 5 home albuterol treatments today with no relief. 98% on 3L currently.

## 2017-06-12 NOTE — ED Provider Notes (Signed)
[facility] EMERGENCY DEPARTMENT Provider Note   CSN: [id number] Arrival date & time: 06/12/17  2300     History   Chief Complaint Chief Complaint  Patient presents with  . Shortness of Breath    HPI Taylor [name] is a 62 y.o. male with a hx of asthma, colon cancer (completed chemo), COPD, constipation, prostate cancer presents to the Emergency Department complaining of gradual, persistent, progressively worsening SOB onset yesterday.  Pt reports he had to increase his oxygen and had associated chest pressure.  Pt reports he has been straining to have small BMs at home. He has not taken anything to help with this. He reports this makes his SOB worse.  Pt also reports he has a weak stream and some penile pain with urination.  Pt reports he feels like he is not emptying his bladder.  He reports small solid defecation every time he strains to urinate. Pt reports decreased appetite as well.  Pt reports he wears oxygen 3L via [place] at all times.  Record review shows pt was d/ home on 06/09/17 after admission for COPD exacerbation with doxycycline and steroid taper.  Pt reports he has been taking these as directed.  Pt reports he has used his nebulizer 5 times today along with [facility] without relief.  Record review shows that pt is poorly compliant with his medications.  Pt's last Pulmonary follow-up was May 2018.     The history is provided by the patient and medical records. No language interpreter was used.    Past Medical History:  Diagnosis Date  . Asthma   . Bipolar 1 disorder ([place]) 02/05/2012  . Colon cancer ([place]) 04/11/11   adenocarcinoma of colon, 7/19 nodes pos.FINISHED CHEMO/DR. SHERRILL  . COPD (chronic obstructive pulmonary disease) ([place])    SMOKER  . Depression   . Emphysema of lung ([place])   . Full dentures   . Hemorrhoids   . On home oxygen therapy    "3L; 24/7" (05/29/2017)  . Pneumonia ~ 2016   "double pneumonia"  . Prostate cancer ([place])    Gleason  score = 7, supposed to have radiation therapy but he has not followed up (05/29/2017)  . Rib fractures    hx of    Patient Active Problem List   Diagnosis Date Noted  . Hyperglycemia 06/08/2017  . Constipation 06/08/2017  . SIRS (systemic inflammatory response syndrome) ([place]) 05/21/2017  . Tobacco abuse 05/13/2017  . HLD (hyperlipidemia) 05/13/2017  . Leukocytosis 04/06/2017  . Adjustment disorder with depressed mood 03/28/2017  . Adjustment disorder with mixed disturbance of emotions and conduct   . Chronic respiratory failure with hypoxia ([place]) 03/24/2017  . Normocytic normochromic anemia 03/24/2017  . COPD with acute exacerbation ([place]) 10/22/2016  . Alcohol abuse 01/09/2013  . COPD exacerbation ([place]) 01/09/2013  . Chest pain 01/09/2013  . Smoker 12/16/2012  . Inguinal hernia unilateral, non-recurrent, right 05/01/2012  . Dyspepsia 02/05/2012  . Bipolar 1 disorder ([place]) 02/05/2012  . 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 COPD  GOLD III 02/03/2012  . Thrombocytopenia ([place]) 02/03/2012  . Depression   . 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 COLON SURGERY  04/11/11   Sigmoid colectomy  . COLOSTOMY REVISION  04/11/2011   Procedure: COLON RESECTION SIGMOID;  Surgeon: [name], MD;  Location: [facility] ORS;  Service: General;  Laterality: N/A;  low anterior colon resection   . HERNIA REPAIR    .  INGUINAL HERNIA REPAIR Right 05/01/2012   Procedure: HERNIA REPAIR INGUINAL ADULT;  Surgeon: [name], MD;  Location: [facility];  Service: General;  Laterality: Right;  . INSERTION OF MESH Right 05/01/2012   Procedure: INSERTION OF MESH;  Surgeon: [name], MD;  Location: [facility];  Service: General;  Laterality: Right;  . PORT-A-CATH REMOVAL Left 05/01/2012   Procedure: REMOVAL Infusion Port;  Surgeon: [name], MD;  Location: [facility];  Service: General;  Laterality: Left;  . PORTACATH PLACEMENT  05/02/2011    Procedure: INSERTION PORT-A-CATH;  Surgeon: [name], MD;  Location: [facility];  Service: General;  Laterality: N/A;  . PROSTATE BIOPSY          Home Medications    Prior to Admission medications   Medication Sig Start Date End Date Taking? Authorizing Provider  acetaminophen (TYLENOL) 500 MG tablet Take 1 tablet (500 mg total) by mouth every 6 (six) hours as needed (as needed for pain.). 06/02/17  Yes Arrien, Jimmy Picket, MD  albuterol (PROVENTIL) (2.5 MG/3ML) 0.083% nebulizer solution Take 3 mLs (2.5 mg total) by nebulization every 6 (six) hours as needed for wheezing or shortness of breath. Dx: J44.9 06/02/17 07/02/17 Yes Arrien, Jimmy Picket, MD  ARIPiprazole (ABILIFY) 5 MG tablet Take 1 tablet (5 mg total) by mouth every morning. Patient taking differently: Take 5 mg by mouth daily.  05/18/17  Yes Nita Sells, MD  doxycycline (VIBRA-TABS) 100 MG tablet Take 1 tablet (100 mg total) by mouth every 12 (twelve) hours. 06/09/17  Yes Samuella Cota, MD  escitalopram (LEXAPRO) 20 MG tablet Take 1 tablet (20 mg total) by mouth every morning. Patient taking differently: Take 20 mg by mouth daily.  05/18/17  Yes Nita Sells, MD  lovastatin (MEVACOR) 20 MG tablet Take 1 tablet (20 mg total) by mouth at bedtime. 05/18/17  Yes Nita Sells, MD  mometasone-formoterol (DULERA) 100-5 MCG/ACT AERO Inhale 2 puffs into the lungs 2 (two) times daily. 05/18/17  Yes Nita Sells, MD  OXYGEN Inhale 3 L into the lungs.    Yes [provider]  polyethylene glycol (MIRALAX / GLYCOLAX) packet Take 17 g by mouth daily as needed for mild constipation. 06/09/17  Yes Samuella Cota, MD  predniSONE (DELTASONE) 10 MG tablet Take 2 tablets (20 mg total) by mouth daily with breakfast for 3 days, THEN 1 tablet (10 mg total) daily with breakfast for 3 days. 06/09/17 06/15/17 Yes Samuella Cota, MD  tiotropium (SPIRIVA HANDIHALER) 18 MCG inhalation capsule Place 18 mcg into  inhaler and inhale daily.   Yes [provider]    Family History Family History  Problem Relation Age of Onset  . Heart disease Father   . Lung cancer Maternal Uncle        smoked    Social History Social History   Tobacco Use  . Smoking status: Former Smoker    Packs/day: 0.10    Years: 46.00    Pack years: 4.60    Types: Cigarettes    Last attempt to quit: 05/21/2017    Years since quitting: 0.0  . Smokeless tobacco: Former Systems developer    Types: Chew  Substance Use Topics  . Alcohol use: Not Currently    Comment: h/o use in the past, no h/o heavy use  . Drug use: No     Allergies   Codeine   Review of Systems Review of Systems  Constitutional: Negative for appetite change, diaphoresis, fatigue, fever and  unexpected weight change.  HENT: Negative for mouth sores.   Eyes: Negative for visual disturbance.  Respiratory: Positive for chest tightness and shortness of breath. Negative for cough and wheezing.   Cardiovascular: Positive for chest pain.  Gastrointestinal: Positive for constipation. Negative for abdominal pain, diarrhea, nausea and vomiting.  Endocrine: Negative for polydipsia, polyphagia and polyuria.  Genitourinary: Positive for difficulty urinating and dysuria. Negative for frequency, hematuria and urgency.  Musculoskeletal: Negative for back pain and neck stiffness.  Skin: Negative for rash.  Allergic/Immunologic: Negative for immunocompromised state.  Neurological: Negative for syncope, light-headedness and headaches.  Hematological: Does not bruise/bleed easily.  Psychiatric/Behavioral: Negative for sleep disturbance. The patient is not nervous/anxious.      Physical Exam Updated Vital Signs BP 127/78 (BP Location: Right Arm)   Pulse (!) 102   Temp 98.5 F (36.9 C) (Oral)   Resp (!) 26   Ht 5\' 11"  (1.803 m)   Wt 68 kg (150 lb)   SpO2 98%   BMI 20.92 kg/m   Physical Exam  Constitutional: He appears well-developed and well-nourished. No  distress.  Awake, alert, nontoxic appearance  HENT:  Head: Normocephalic and atraumatic.  Mouth/Throat: Oropharynx is clear and moist. No oropharyngeal exudate.  Eyes: Conjunctivae are normal. No scleral icterus.  Neck: Normal range of motion. Neck supple.  Cardiovascular: Regular rhythm and intact distal pulses. Tachycardia present.  Pulses:      Radial pulses are 2+ on the right side, and 2+ on the left side.  Pulmonary/Chest: Accessory muscle usage ( mild) present. Tachypnea noted. No respiratory distress. He has decreased breath sounds. He has wheezes (throughout, worst in the Left upper lobe). He has no rhonchi. He has no rales.  Equal chest expansion  Abdominal: Soft. Bowel sounds are normal. He exhibits no mass. There is tenderness (very mild) in the suprapubic area. There is no rebound and no guarding.  Musculoskeletal: Normal range of motion. He exhibits no edema.  Neurological: He is alert.  Speech is clear and goal oriented Moves extremities without ataxia  Skin: Skin is warm. He is diaphoretic.  Psychiatric: He has a normal mood and affect.  Nursing note and vitals reviewed.    ED Treatments / Results  Labs (all labs ordered are listed, but only abnormal results are displayed) Labs Reviewed  CBC - Abnormal; Notable for the following components:      Result Value   RBC 3.96 (*)    Hemoglobin 12.3 (*)    HCT 37.7 (*)    All other components within normal limits  COMPREHENSIVE METABOLIC PANEL - Abnormal; Notable for the following components:   Chloride 95 (*)    CO2 36 (*)    Glucose, Bld 219 (*)    BUN 29 (*)    Calcium 8.6 (*)    Total Protein 5.3 (*)    All other components within normal limits  URINALYSIS, ROUTINE W REFLEX MICROSCOPIC - Abnormal; Notable for the following components:   Glucose, UA 50 (*)    All other components within normal limits  BASIC METABOLIC PANEL - Abnormal; Notable for the following components:   Chloride 94 (*)    CO2 34 (*)     Glucose, Bld 323 (*)    BUN 26 (*)    Calcium 8.6 (*)    All other components within normal limits  CBC - Abnormal; Notable for the following components:   RBC 4.00 (*)    Hemoglobin 12.2 (*)    HCT 37.6 (*)  All other components within normal limits  LIPASE, BLOOD  TROPONIN I  BRAIN NATRIURETIC PEPTIDE  VITAMIN B12  FOLATE  IRON AND TIBC  FERRITIN  RETICULOCYTES  I-STAT TROPONIN, ED    EKG EKG Interpretation  Date/Time:  Thursday Jun 12 2017 23:04:54 EDT Ventricular Rate:  100 PR Interval:    QRS Duration: 103 QT Interval:  336 QTC Calculation: 434 R Axis:   80 Text Interpretation:  Sinus tachycardia Anteroseptal infarct, age indeterminate ST elevation, consider inferior injury Lateral leads are also involved No significant change was found Confirmed by Jola Schmidt [phone]) on 06/12/2017 11:27:20 PM   EKG Interpretation  Date/Time:  Friday Jun 13 2017 03:29:49 EDT Ventricular Rate:  97 PR Interval:    QRS Duration: 93 QT Interval:  331 QTC Calculation: 421 R Axis:   79 Text Interpretation:  Sinus rhythm Atrial premature complex Short PR interval Consider anterolateral infarct No significant change was found Confirmed by Jola Schmidt [phone]) on 06/13/2017 4:03:40 AM       Radiology Dg Chest 2 View  Result Date: 06/13/2017 CLINICAL DATA:  Shortness of breath for 5 years.  History of COPD. EXAM: CHEST - 2 VIEW COMPARISON:  Chest radiograph Jun 07, 2017 FINDINGS: Cardiomediastinal silhouette is normal. Increased lung volumes with flattened hemidiaphragms. No pleural effusions or focal consolidations. Trachea projects midline and there is no pneumothorax. Soft tissue planes and included osseous structures are non-suspicious. Old RIGHT rib fractures. IMPRESSION: COPD, no acute cardiopulmonary process. Electronically Signed   By: Elon Alas M.D.   On: 06/13/2017 00:42    Procedures Procedures (including critical care time)  Medications Ordered in  ED Medications  ARIPiprazole (ABILIFY) tablet 5 mg (has no administration in time range)  escitalopram (LEXAPRO) tablet 20 mg (has no administration in time range)  pravastatin (PRAVACHOL) tablet 20 mg (has no administration in time range)  acetaminophen (TYLENOL) tablet 650 mg (has no administration in time range)    Or  acetaminophen (TYLENOL) suppository 650 mg (has no administration in time range)  ondansetron (ZOFRAN) tablet 4 mg (has no administration in time range)    Or  ondansetron (ZOFRAN) injection 4 mg (has no administration in time range)  insulin aspart (novoLOG) injection 0-9 Units (has no administration in time range)  enoxaparin (LOVENOX) injection 40 mg (has no administration in time range)  albuterol (PROVENTIL) (2.5 MG/3ML) 0.083% nebulizer solution 2.5 mg (2.5 mg Nebulization Not Given 06/13/17 0430)  albuterol (PROVENTIL) (2.5 MG/3ML) 0.083% nebulizer solution 2.5 mg (2.5 mg Nebulization Given 06/13/17 0344)  ipratropium (ATROVENT) nebulizer solution 0.5 mg (0.5 mg Nebulization Given 06/13/17 0344)  budesonide (PULMICORT) nebulizer solution 0.25 mg (has no administration in time range)  doxycycline (VIBRAMYCIN) 100 mg in sodium chloride 0.9 % 250 mL IVPB (100 mg Intravenous New Bag/Given 06/13/17 0527)  LORazepam (ATIVAN) injection 1 mg (1 mg Intravenous Given 06/12/17 2351)  ipratropium (ATROVENT) nebulizer solution 1 mg (1 mg Nebulization Given 06/12/17 2349)  methylPREDNISolone sodium succinate (SOLU-MEDROL) 125 mg/2 mL injection 125 mg (125 mg Intravenous Given 06/12/17 2349)  magnesium sulfate IVPB 2 g 50 mL (0 g Intravenous Stopped 06/13/17 0328)     Initial Impression / Assessment and Plan / ED Course  I have reviewed the triage vital signs and the nursing notes.  Pertinent labs & imaging results that were available during my care of the patient were reviewed by me and considered in my medical decision making (see chart for details).  Clinical Course as of Jun 14 702  Fri Jun 13, 2017  0214 With improved work of breathing but now with tachycardia and wheeze throughout.  Patient remains without hypoxia on 5 L via nasal cannula.  He reports that he continues to feel short of breath.   [HM]  0216 UA pending   [HM]  0216 Pt now with tachycardia, however on clinical exam, rate of 106  Pulse Rate(!): 140 [HM]  0222 No evidence of urinary tract infection  Urinalysis, Routine w reflex microscopic(!) [HM]  0224 Remains tachypneic  Resp(!): 24 [HM]    Clinical Course User Index [HM] Rahma Meller, Jarrett Soho, PA-C    With history of COPD and noncompliance with his medications presents today with COPD exacerbation.  Afebrile at home.  Patient pain meds at home are not improving.  Patient given continuous nebulizer, Solu-Medrol and magnesium here.  He continues to wheeze but does have decreased work of breathing at this time.  Chest x-ray without evidence of pneumonia.  He is continuing to take doxycycline at home post hospital discharge.  Will need admission for recurrent COPD exacerbation.  Discussed with Dr. Hal Hope who will admit.  Final Clinical Impressions(s) / ED Diagnoses   Final diagnoses:  COPD exacerbation [facility])  Urinary hesitancy    ED Discharge Orders    None       Loni Muse Gwenlyn Perking 06/13/17 3532    Jola Schmidt, MD 06/14/17 [phone]

## 2017-06-13 ENCOUNTER — Emergency Department (HOSPITAL_COMMUNITY): Payer: Medicaid Other

## 2017-06-13 ENCOUNTER — Encounter (HOSPITAL_COMMUNITY): Payer: Self-pay | Admitting: Internal Medicine

## 2017-06-13 ENCOUNTER — Other Ambulatory Visit: Payer: Self-pay

## 2017-06-13 DIAGNOSIS — Z9119 Patient's noncompliance with other medical treatment and regimen: Secondary | ICD-10-CM | POA: Diagnosis not present

## 2017-06-13 DIAGNOSIS — Z85038 Personal history of other malignant neoplasm of large intestine: Secondary | ICD-10-CM | POA: Diagnosis not present

## 2017-06-13 DIAGNOSIS — J441 Chronic obstructive pulmonary disease with (acute) exacerbation: Secondary | ICD-10-CM | POA: Diagnosis not present

## 2017-06-13 DIAGNOSIS — Z8546 Personal history of malignant neoplasm of prostate: Secondary | ICD-10-CM | POA: Diagnosis not present

## 2017-06-13 DIAGNOSIS — R0902 Hypoxemia: Secondary | ICD-10-CM | POA: Diagnosis present

## 2017-06-13 DIAGNOSIS — E785 Hyperlipidemia, unspecified: Secondary | ICD-10-CM | POA: Diagnosis present

## 2017-06-13 DIAGNOSIS — Z885 Allergy status to narcotic agent status: Secondary | ICD-10-CM | POA: Diagnosis not present

## 2017-06-13 DIAGNOSIS — E440 Moderate protein-calorie malnutrition: Secondary | ICD-10-CM | POA: Diagnosis present

## 2017-06-13 DIAGNOSIS — T380X5A Adverse effect of glucocorticoids and synthetic analogues, initial encounter: Secondary | ICD-10-CM | POA: Diagnosis present

## 2017-06-13 DIAGNOSIS — F319 Bipolar disorder, unspecified: Secondary | ICD-10-CM | POA: Diagnosis present

## 2017-06-13 DIAGNOSIS — Z7952 Long term (current) use of systemic steroids: Secondary | ICD-10-CM | POA: Diagnosis not present

## 2017-06-13 DIAGNOSIS — Z7951 Long term (current) use of inhaled steroids: Secondary | ICD-10-CM | POA: Diagnosis not present

## 2017-06-13 DIAGNOSIS — D649 Anemia, unspecified: Secondary | ICD-10-CM | POA: Diagnosis present

## 2017-06-13 DIAGNOSIS — Z9981 Dependence on supplemental oxygen: Secondary | ICD-10-CM | POA: Diagnosis not present

## 2017-06-13 DIAGNOSIS — Z6822 Body mass index (BMI) 22.0-22.9, adult: Secondary | ICD-10-CM | POA: Diagnosis not present

## 2017-06-13 DIAGNOSIS — E0965 Drug or chemical induced diabetes mellitus with hyperglycemia: Secondary | ICD-10-CM | POA: Diagnosis present

## 2017-06-13 DIAGNOSIS — Z87891 Personal history of nicotine dependence: Secondary | ICD-10-CM | POA: Diagnosis not present

## 2017-06-13 DIAGNOSIS — I491 Atrial premature depolarization: Secondary | ICD-10-CM | POA: Diagnosis present

## 2017-06-13 DIAGNOSIS — N401 Enlarged prostate with lower urinary tract symptoms: Secondary | ICD-10-CM | POA: Diagnosis present

## 2017-06-13 DIAGNOSIS — R3911 Hesitancy of micturition: Secondary | ICD-10-CM | POA: Diagnosis present

## 2017-06-13 LAB — GLUCOSE, CAPILLARY
GLUCOSE-CAPILLARY: 346 mg/dL — AB (ref 65–99)
GLUCOSE-CAPILLARY: 128 mg/dL — AB (ref 65–99)
Glucose-Capillary: 460 mg/dL — ABNORMAL HIGH (ref 65–99)
Glucose-Capillary: 458 mg/dL — ABNORMAL HIGH (ref 65–99)

## 2017-06-13 LAB — CULTURE, BLOOD (ROUTINE X 2)
Culture: NO GROWTH
Culture: NO GROWTH
SPECIAL REQUESTS: ADEQUATE
SPECIAL REQUESTS: ADEQUATE

## 2017-06-13 LAB — COMPREHENSIVE METABOLIC PANEL
ALBUMIN: 3.6 g/dL (ref 3.5–5.0)
ALK PHOS: 67 U/L (ref 38–126)
ALT: 28 U/L (ref 17–63)
ALT: 28 U/L (ref 17–63)
ANION GAP: 6 (ref 5–15)
ANION GAP: 10 (ref 5–15)
AST: 21 U/L (ref 15–41)
AST: 17 U/L (ref 15–41)
Albumin: 3.5 g/dL (ref 3.5–5.0)
Alkaline Phosphatase: 70 U/L (ref 38–126)
BILIRUBIN TOTAL: 0.7 mg/dL (ref 0.3–1.2)
BUN: 29 mg/dL — ABNORMAL HIGH (ref 6–20)
BUN: 25 mg/dL — ABNORMAL HIGH (ref 6–20)
CALCIUM: 8.6 mg/dL — AB (ref 8.9–10.3)
CHLORIDE: 93 mmol/L — AB (ref 101–111)
CO2: 36 mmol/L — ABNORMAL HIGH (ref 22–32)
CO2: 33 mmol/L — AB (ref 22–32)
Calcium: 8.6 mg/dL — ABNORMAL LOW (ref 8.9–10.3)
Chloride: 95 mmol/L — ABNORMAL LOW (ref 101–111)
Creatinine, Ser: 0.94 mg/dL (ref 0.61–1.24)
Creatinine, Ser: 0.9 mg/dL (ref 0.61–1.24)
GFR calc Af Amer: >60 mL/min (ref >60)
GFR calc non Af Amer: >60 mL/min (ref >60)
GFR calc non Af Amer: >60 mL/min (ref >60)
Glucose, Bld: 219 mg/dL — ABNORMAL HIGH (ref 65–99)
Glucose, Bld: 372 mg/dL — ABNORMAL HIGH (ref 65–99)
POTASSIUM: 4.7 mmol/L (ref 3.5–5.1)
POTASSIUM: 4.6 mmol/L (ref 3.5–5.1)
SODIUM: 137 mmol/L (ref 135–145)
SODIUM: 136 mmol/L (ref 135–145)
TOTAL PROTEIN: 5.3 g/dL — AB (ref 6.5–8.1)
Total Bilirubin: 0.7 mg/dL (ref 0.3–1.2)
Total Protein: 5.5 g/dL — ABNORMAL LOW (ref 6.5–8.1)

## 2017-06-13 LAB — BASIC METABOLIC PANEL
Anion gap: 8 (ref 5–15)
BUN: 26 mg/dL — AB (ref 6–20)
CO2: 34 mmol/L — ABNORMAL HIGH (ref 22–32)
Calcium: 8.6 mg/dL — ABNORMAL LOW (ref 8.9–10.3)
Chloride: 94 mmol/L — ABNORMAL LOW (ref 101–111)
Creatinine, Ser: 0.86 mg/dL (ref 0.61–1.24)
GFR calc Af Amer: >60 mL/min (ref >60)
GLUCOSE: 323 mg/dL — AB (ref 65–99)
POTASSIUM: 4.6 mmol/L (ref 3.5–5.1)
SODIUM: 136 mmol/L (ref 135–145)

## 2017-06-13 LAB — TROPONIN I

## 2017-06-13 LAB — CBG MONITORING, ED
GLUCOSE-CAPILLARY: 406 mg/dL — AB (ref 65–99)
Glucose-Capillary: 371 mg/dL — ABNORMAL HIGH (ref 65–99)

## 2017-06-13 LAB — CBC
HCT: 37.6 % — ABNORMAL LOW (ref 39.0–52.0)
HEMOGLOBIN: 12.2 g/dL — AB (ref 13.0–17.0)
MCH: 30.5 pg (ref 26.0–34.0)
MCHC: 32.4 g/dL (ref 30.0–36.0)
MCV: 94 fL (ref 78.0–100.0)
Platelets: 192 10*3/uL (ref 150–400)
RBC: 4 MIL/uL — AB (ref 4.22–5.81)
RDW: 13.3 % (ref 11.5–15.5)
WBC: 7.7 10*3/uL (ref 4.0–10.5)

## 2017-06-13 LAB — RETICULOCYTES
RBC.: 3.97 MIL/uL — ABNORMAL LOW (ref 4.22–5.81)
RETIC COUNT ABSOLUTE: 79.4 10*3/uL (ref 19.0–186.0)
Retic Ct Pct: 2 % (ref 0.4–3.1)

## 2017-06-13 LAB — MRSA PCR SCREENING: MRSA by PCR: NEGATIVE

## 2017-06-13 LAB — BRAIN NATRIURETIC PEPTIDE: B Natriuretic Peptide: 33.8 pg/mL (ref 0.0–100.0)

## 2017-06-13 LAB — IRON AND TIBC
IRON: 52 ug/dL (ref 45–182)
Saturation Ratios: 18 % (ref 17.9–39.5)
TIBC: 281 ug/dL (ref 250–450)
UIBC: 229 ug/dL

## 2017-06-13 LAB — URINALYSIS, ROUTINE W REFLEX MICROSCOPIC
BILIRUBIN URINE: NEGATIVE
Glucose, UA: 50 mg/dL — AB
Hgb urine dipstick: NEGATIVE
KETONES UR: NEGATIVE mg/dL
Leukocytes, UA: NEGATIVE
NITRITE: NEGATIVE
PROTEIN: NEGATIVE mg/dL
Specific Gravity, Urine: 1.018 (ref 1.005–1.030)
pH: 6 (ref 5.0–8.0)

## 2017-06-13 LAB — LIPASE, BLOOD: Lipase: 40 U/L (ref 11–51)

## 2017-06-13 LAB — FERRITIN: Ferritin: 154 ng/mL (ref 24–336)

## 2017-06-13 LAB — VITAMIN B12: Vitamin B-12: 351 pg/mL (ref 180–914)

## 2017-06-13 LAB — FOLATE: FOLATE: 15.3 ng/mL (ref >5.9)

## 2017-06-13 MED ORDER — ACETAMINOPHEN 650 MG RE SUPP: 650.0000 mg | Freq: Four times a day (QID) | RECTAL | Status: DC | PRN

## 2017-06-13 MED ORDER — IPRATROPIUM-ALBUTEROL 0.5-2.5 (3) MG/3ML IN SOLN
3.0000 mL | RESPIRATORY_TRACT | Status: DC
  Administered 2017-06-13 – 2017-06-14 (×4): 3 mL via RESPIRATORY_TRACT
  Filled 2017-06-13 (×4): qty 3

## 2017-06-13 MED ORDER — INSULIN GLARGINE 100 UNIT/ML ~~LOC~~ SOLN
15.0000 [IU] | Freq: Every day | SUBCUTANEOUS | Status: DC
  Administered 2017-06-13: 15 [IU] via SUBCUTANEOUS
  Filled 2017-06-13: qty 0.15

## 2017-06-13 MED ORDER — BUDESONIDE 0.25 MG/2ML IN SUSP
0.2500 mg | Freq: Two times a day (BID) | RESPIRATORY_TRACT | Status: DC
  Administered 2017-06-13 – 2017-06-17 (×8): 0.25 mg via RESPIRATORY_TRACT
  Filled 2017-06-13 (×11): qty 2

## 2017-06-13 MED ORDER — PRAVASTATIN SODIUM 20 MG PO TABS
20.0000 mg | ORAL_TABLET | Freq: Every day | ORAL | Status: DC
  Administered 2017-06-13 – 2017-06-16 (×4): 20 mg via ORAL
  Filled 2017-06-13 (×4): qty 1

## 2017-06-13 MED ORDER — INSULIN ASPART 100 UNIT/ML ~~LOC~~ SOLN
0.0000 [IU] | Freq: Three times a day (TID) | SUBCUTANEOUS | Status: DC
  Administered 2017-06-13: 15 [IU] via SUBCUTANEOUS
  Filled 2017-06-13: qty 1

## 2017-06-13 MED ORDER — ONDANSETRON HCL 4 MG/2ML IJ SOLN: 4.0000 mg | Freq: Four times a day (QID) | INTRAMUSCULAR | Status: DC | PRN

## 2017-06-13 MED ORDER — INSULIN ASPART 100 UNIT/ML ~~LOC~~ SOLN: 0.0000 [IU] | Freq: Three times a day (TID) | SUBCUTANEOUS | Status: DC

## 2017-06-13 MED ORDER — ALBUTEROL SULFATE (2.5 MG/3ML) 0.083% IN NEBU
2.5000 mg | INHALATION_SOLUTION | RESPIRATORY_TRACT | Status: DC | PRN
  Administered 2017-06-13 – 2017-06-16 (×3): 2.5 mg via RESPIRATORY_TRACT
  Filled 2017-06-13 (×2): qty 3

## 2017-06-13 MED ORDER — ESCITALOPRAM OXALATE 10 MG PO TABS
20.0000 mg | ORAL_TABLET | Freq: Every day | ORAL | Status: DC
  Administered 2017-06-13 – 2017-06-17 (×5): 20 mg via ORAL
  Filled 2017-06-13 (×5): qty 2

## 2017-06-13 MED ORDER — INSULIN ASPART 100 UNIT/ML ~~LOC~~ SOLN
5.0000 [IU] | Freq: Three times a day (TID) | SUBCUTANEOUS | Status: DC
  Administered 2017-06-14: 5 [IU] via SUBCUTANEOUS

## 2017-06-13 MED ORDER — ACETAMINOPHEN 325 MG PO TABS
650.0000 mg | ORAL_TABLET | Freq: Four times a day (QID) | ORAL | Status: DC | PRN
  Administered 2017-06-14: 650 mg via ORAL
  Filled 2017-06-13: qty 2

## 2017-06-13 MED ORDER — MAGNESIUM SULFATE 2 GM/50ML IV SOLN
2.0000 g | Freq: Once | INTRAVENOUS | Status: AC
  Administered 2017-06-13: 2 g via INTRAVENOUS
  Filled 2017-06-13: qty 50

## 2017-06-13 MED ORDER — ONDANSETRON HCL 4 MG PO TABS: 4.0000 mg | ORAL_TABLET | Freq: Four times a day (QID) | ORAL | Status: DC | PRN

## 2017-06-13 MED ORDER — INSULIN ASPART 100 UNIT/ML ~~LOC~~ SOLN
0.0000 [IU] | Freq: Three times a day (TID) | SUBCUTANEOUS | Status: DC
  Administered 2017-06-13: 20 [IU] via SUBCUTANEOUS
  Administered 2017-06-14: 15 [IU] via SUBCUTANEOUS
  Administered 2017-06-14: 7 [IU] via SUBCUTANEOUS
  Administered 2017-06-14 – 2017-06-15 (×2): 11 [IU] via SUBCUTANEOUS
  Administered 2017-06-15 (×2): 7 [IU] via SUBCUTANEOUS
  Administered 2017-06-16: 4 [IU] via SUBCUTANEOUS
  Administered 2017-06-16: 11 [IU] via SUBCUTANEOUS
  Administered 2017-06-17: 7 [IU] via SUBCUTANEOUS

## 2017-06-13 MED ORDER — INSULIN ASPART 100 UNIT/ML ~~LOC~~ SOLN
20.0000 [IU] | Freq: Once | SUBCUTANEOUS | Status: AC
  Administered 2017-06-13: 20 [IU] via SUBCUTANEOUS

## 2017-06-13 MED ORDER — HYDROXYZINE HCL 10 MG PO TABS
10.0000 mg | ORAL_TABLET | Freq: Three times a day (TID) | ORAL | Status: DC | PRN
  Administered 2017-06-13 – 2017-06-14 (×2): 10 mg via ORAL
  Filled 2017-06-13 (×2): qty 1

## 2017-06-13 MED ORDER — INSULIN ASPART 100 UNIT/ML ~~LOC~~ SOLN
0.0000 [IU] | Freq: Every day | SUBCUTANEOUS | Status: DC
  Administered 2017-06-14: 2 [IU] via SUBCUTANEOUS
  Administered 2017-06-16: 5 [IU] via SUBCUTANEOUS

## 2017-06-13 MED ORDER — ALBUTEROL SULFATE (2.5 MG/3ML) 0.083% IN NEBU
2.5000 mg | INHALATION_SOLUTION | RESPIRATORY_TRACT | Status: DC
  Administered 2017-06-13 (×3): 2.5 mg via RESPIRATORY_TRACT
  Filled 2017-06-13 (×5): qty 3

## 2017-06-13 MED ORDER — INSULIN GLARGINE 100 UNIT/ML ~~LOC~~ SOLN
20.0000 [IU] | Freq: Every day | SUBCUTANEOUS | Status: DC
  Administered 2017-06-14: 20 [IU] via SUBCUTANEOUS
  Filled 2017-06-13: qty 0.2

## 2017-06-13 MED ORDER — INSULIN ASPART 100 UNIT/ML ~~LOC~~ SOLN: 0.0000 [IU] | Freq: Every day | SUBCUTANEOUS | Status: DC

## 2017-06-13 MED ORDER — IPRATROPIUM BROMIDE 0.02 % IN SOLN
0.5000 mg | RESPIRATORY_TRACT | Status: DC
  Administered 2017-06-13 (×4): 0.5 mg via RESPIRATORY_TRACT
  Filled 2017-06-13 (×4): qty 2.5

## 2017-06-13 MED ORDER — ENOXAPARIN SODIUM 40 MG/0.4ML ~~LOC~~ SOLN
40.0000 mg | SUBCUTANEOUS | Status: DC
  Administered 2017-06-13 – 2017-06-16 (×4): 40 mg via SUBCUTANEOUS
  Filled 2017-06-13 (×6): qty 0.4

## 2017-06-13 MED ORDER — SODIUM CHLORIDE 0.9 % IV SOLN
100.0000 mg | Freq: Two times a day (BID) | INTRAVENOUS | Status: DC
  Administered 2017-06-13 – 2017-06-16 (×7): 100 mg via INTRAVENOUS
  Filled 2017-06-13 (×8): qty 100

## 2017-06-13 MED ORDER — ARIPIPRAZOLE 5 MG PO TABS
5.0000 mg | ORAL_TABLET | Freq: Every day | ORAL | Status: DC
  Administered 2017-06-13 – 2017-06-17 (×5): 5 mg via ORAL
  Filled 2017-06-13 (×5): qty 1

## 2017-06-13 MED ORDER — IPRATROPIUM-ALBUTEROL 0.5-2.5 (3) MG/3ML IN SOLN: 3.0000 mL | Freq: Four times a day (QID) | RESPIRATORY_TRACT | Status: DC

## 2017-06-13 MED ORDER — TAMSULOSIN HCL 0.4 MG PO CAPS
0.4000 mg | ORAL_CAPSULE | Freq: Every day | ORAL | Status: DC
  Administered 2017-06-13 – 2017-06-17 (×5): 0.4 mg via ORAL
  Filled 2017-06-13 (×5): qty 1

## 2017-06-13 MED ORDER — INSULIN ASPART 100 UNIT/ML ~~LOC~~ SOLN
3.0000 [IU] | Freq: Three times a day (TID) | SUBCUTANEOUS | Status: DC
  Administered 2017-06-13: 3 [IU] via SUBCUTANEOUS

## 2017-06-13 MED ORDER — METHYLPREDNISOLONE SODIUM SUCC 40 MG IJ SOLR
40.0000 mg | Freq: Four times a day (QID) | INTRAMUSCULAR | Status: DC
  Administered 2017-06-13 – 2017-06-15 (×9): 40 mg via INTRAVENOUS
  Filled 2017-06-13 (×9): qty 1

## 2017-06-13 MED ORDER — ENSURE ENLIVE PO LIQD
237.0000 mL | Freq: Two times a day (BID) | ORAL | Status: DC
  Administered 2017-06-13 – 2017-06-17 (×8): 237 mL via ORAL

## 2017-06-13 NOTE — H&P (Signed)
History and Physical    Taylor [name]:[id number] DOB: 22-Dec-1955 DOA: 06/12/2017  PCP: [facility]  Patient coming from: Home.  Chief Complaint: Shortness of breath.  HPI: Taylor [name] is a 62 y.o. male with history of COPD, hyperlipidemia, bipolar disorder and previous history of alcohol abuse presents to the ER with complaint of shortness of breath.  Patient has been admitted 3 times this month for COPD exacerbation.  During the first visit patient had CT angiogram of the chest which was negative for PE.  Patient states he did well during the last discharge 4 days ago and started having again shortness of breath over the last 2 days.  Denies any chest pain productive cough fever or chills.  ED Course: In the ER patient has been found to be wheezing and chest x-ray was done which did not show anything acute.  Patient was placed on nebulizer treatment.  Patient also complains of urinary hesitancy.  Bladder scan was done which was showing around 200 cc residual but was able to void without difficulty.  Patient was given nebulizer treatment IV steroid admitted for COPD exacerbation.  EKG shows normal sinus rhythm with ST-T changes comparable to the old EKG.  Review of Systems: As per HPI, rest all negative.   Past Medical History:  Diagnosis Date  . Asthma   . Bipolar 1 disorder ([place]) 02/05/2012  . Colon cancer ([place]) 04/11/11   adenocarcinoma of colon, 7/19 nodes pos.FINISHED CHEMO/DR. SHERRILL  . COPD (chronic obstructive pulmonary disease) ([place])    SMOKER  . Depression   . Emphysema of lung ([place])   . Full dentures   . Hemorrhoids   . On home oxygen therapy    "3L; 24/7" (05/29/2017)  . Pneumonia ~ 2016   "double pneumonia"  . Prostate cancer ([place])    Gleason score = 7, supposed to have radiation therapy but he has not followed up (05/29/2017)  . Rib fractures    hx of    Past Surgical History:  Procedure Laterality Date  . COLON SURGERY   04/11/11   Sigmoid colectomy  . COLOSTOMY REVISION  04/11/2011   Procedure: COLON RESECTION SIGMOID;  Surgeon: [name], MD;  Location: [facility];  Service: General;  Laterality: N/A;  low anterior colon resection   . HERNIA REPAIR    . INGUINAL HERNIA REPAIR Right 05/01/2012   Procedure: HERNIA REPAIR INGUINAL ADULT;  Surgeon: [name], MD;  Location: [facility];  Service: General;  Laterality: Right;  . INSERTION OF MESH Right 05/01/2012   Procedure: INSERTION OF MESH;  Surgeon: [name], MD;  Location: [facility];  Service: General;  Laterality: Right;  . PORT-A-CATH REMOVAL Left 05/01/2012   Procedure: REMOVAL Infusion Port;  Surgeon: [name], MD;  Location: [facility];  Service: General;  Laterality: Left;  . PORTACATH PLACEMENT  05/02/2011   Procedure: INSERTION PORT-A-CATH;  Surgeon: [name], MD;  Location: [facility];  Service: General;  Laterality: N/A;  . PROSTATE BIOPSY       reports that he quit smoking about 3 weeks ago. His smoking use included cigarettes. He has a 4.60 pack-year smoking history. He has quit using smokeless tobacco. His smokeless tobacco use included chew. He reports that he drank alcohol. He reports that he does not use drugs.  Allergies  Allergen Reactions  . Codeine Nausea And Vomiting    Family History  Problem Relation Age of Onset  .  Heart disease Father   . Lung cancer Maternal Uncle        smoked    Prior to Admission medications   Medication Sig Start Date End Date Taking? Authorizing Provider  acetaminophen (TYLENOL) 500 MG tablet Take 1 tablet (500 mg total) by mouth every 6 (six) hours as needed (as needed for pain.). 06/02/17   Arrien, Jimmy Picket, MD  albuterol (PROVENTIL) (2.5 MG/3ML) 0.083% nebulizer solution Take 3 mLs (2.5 mg total) by nebulization every 6 (six) hours as needed for wheezing or shortness of breath. Dx: J44.9 06/02/17 07/02/17  Arrien, Jimmy Picket, MD  ARIPiprazole (ABILIFY) 5 MG tablet Take 1 tablet (5 mg total)  by mouth every morning. Patient taking differently: Take 5 mg by mouth daily.  05/18/17   Nita Sells, MD  doxycycline (VIBRA-TABS) 100 MG tablet Take 1 tablet (100 mg total) by mouth every 12 (twelve) hours. 06/09/17   Samuella Cota, MD  escitalopram (LEXAPRO) 20 MG tablet Take 1 tablet (20 mg total) by mouth every morning. Patient taking differently: Take 20 mg by mouth daily.  05/18/17   Nita Sells, MD  lovastatin (MEVACOR) 20 MG tablet Take 1 tablet (20 mg total) by mouth at bedtime. 05/18/17   Nita Sells, MD  mometasone-formoterol (DULERA) 100-5 MCG/ACT AERO Inhale 2 puffs into the lungs 2 (two) times daily. 05/18/17   Nita Sells, MD  OXYGEN Inhale 3 L into the lungs.     [provider]  polyethylene glycol (MIRALAX / GLYCOLAX) packet Take 17 g by mouth daily as needed for mild constipation. 06/09/17   Samuella Cota, MD  predniSONE (DELTASONE) 10 MG tablet Take 2 tablets (20 mg total) by mouth daily with breakfast for 3 days, THEN 1 tablet (10 mg total) daily with breakfast for 3 days. 06/09/17 06/15/17  Samuella Cota, MD  tiotropium (SPIRIVA HANDIHALER) 18 MCG inhalation capsule Place 18 mcg into inhaler and inhale daily.    [provider]    Physical Exam: Vitals:   06/13/17 0200 06/13/17 0215 06/13/17 0300 06/13/17 0315  BP: 98/82 118/60 110/68 110/72  Pulse: (!) 140 (!) 101 96 (!) 102  Resp: 19 (!) 24 (!) 27 (!) 26  Temp:      TempSrc:      SpO2: 97% 93% 92% 91%  Weight:      Height:          Constitutional: Moderately built and nourished. Vitals:   06/13/17 0200 06/13/17 0215 06/13/17 0300 06/13/17 0315  BP: 98/82 118/60 110/68 110/72  Pulse: (!) 140 (!) 101 96 (!) 102  Resp: 19 (!) 24 (!) 27 (!) 26  Temp:      TempSrc:      SpO2: 97% 93% 92% 91%  Weight:      Height:       Eyes: Anicteric no pallor. ENMT: No discharge from the ears eyes nose or mouth. Neck: No mass felt.  No JVD  appreciated. Respiratory: Bilateral expiratory wheezing heard no crepitations. Cardiovascular: S1-S2 heard no murmurs appreciated. Abdomen: Soft nontender bowel sounds present. Musculoskeletal: No edema.  No joint effusion. Skin: No rash. Neurologic: Alert awake oriented to time place and person.  Moves all extremities. Psychiatric: Appears normal.  Normal affect.   Labs on Admission: I have personally reviewed following labs and imaging studies  CBC: Recent Labs  Lab 06/07/17 2258 06/12/17 2318  WBC 7.5 9.2  HGB 12.4* 12.3*  HCT 38.4* 37.7*  MCV 94.3 95.2  PLT 200  562   Basic Metabolic Panel: Recent Labs  Lab 06/07/17 2258 06/12/17 2318  NA 138 137  K 4.3 4.7  CL 91* 95*  CO2 42* 36*  GLUCOSE 302* 219*  BUN 10 29*  CREATININE 0.71 0.94  CALCIUM 8.7* 8.6*   GFR: Estimated Creatinine Clearance: 78.4 mL/min (by C-G formula based on SCr of 0.94 mg/dL). Liver Function Tests: Recent Labs  Lab 06/12/17 2318  AST 21  ALT 28  ALKPHOS 67  BILITOT 0.7  PROT 5.3*  ALBUMIN 3.6   Recent Labs  Lab 06/12/17 2318  LIPASE 40   No results for input(s): AMMONIA in the last 168 hours. Coagulation Profile: No results for input(s): INR, PROTIME in the last 168 hours. Cardiac Enzymes: No results for input(s): CKTOTAL, CKMB, CKMBINDEX, TROPONINI in the last 168 hours. BNP (last 3 results) No results for input(s): PROBNP in the last 8760 hours. HbA1C: No results for input(s): HGBA1C in the last 72 hours. CBG: Recent Labs  Lab 06/08/17 1204 06/08/17 1648 06/08/17 2128 06/09/17 0729 06/09/17 1204  GLUCAP 369* 310* 276* 291* 235*   Lipid Profile: No results for input(s): CHOL, HDL, LDLCALC, TRIG, CHOLHDL, LDLDIRECT in the last 72 hours. Thyroid Function Tests: No results for input(s): TSH, T4TOTAL, FREET4, T3FREE, THYROIDAB in the last 72 hours. Anemia Panel: No results for input(s): VITAMINB12, FOLATE, FERRITIN, TIBC, IRON, RETICCTPCT in the last 72 hours. Urine  analysis:    Component Value Date/Time   COLORURINE YELLOW 06/13/2017 0203   APPEARANCEUR CLEAR 06/13/2017 0203   LABSPEC 1.018 06/13/2017 0203   PHURINE 6.0 06/13/2017 0203   GLUCOSEU 50 (A) 06/13/2017 0203   HGBUR NEGATIVE 06/13/2017 0203   BILIRUBINUR NEGATIVE 06/13/2017 0203   KETONESUR NEGATIVE 06/13/2017 0203   PROTEINUR NEGATIVE 06/13/2017 0203   UROBILINOGEN 1.0 01/09/2013 1843   NITRITE NEGATIVE 06/13/2017 0203   LEUKOCYTESUR NEGATIVE 06/13/2017 0203   Sepsis Labs: @LABRCNTIP (procalcitonin:4,lacticidven:4) ) Recent Results (from the past 240 hour(s))  Respiratory Panel by PCR     Status: None   Collection Time: 06/08/17  5:00 AM  Result Value Ref Range Status   Adenovirus NOT DETECTED NOT DETECTED Final   Coronavirus 229E NOT DETECTED NOT DETECTED Final   Coronavirus HKU1 NOT DETECTED NOT DETECTED Final   Coronavirus NL63 NOT DETECTED NOT DETECTED Final   Coronavirus OC43 NOT DETECTED NOT DETECTED Final   Metapneumovirus NOT DETECTED NOT DETECTED Final   Rhinovirus / Enterovirus NOT DETECTED NOT DETECTED Final   Influenza A NOT DETECTED NOT DETECTED Final   Influenza B NOT DETECTED NOT DETECTED Final   Parainfluenza Virus 1 NOT DETECTED NOT DETECTED Final   Parainfluenza Virus 2 NOT DETECTED NOT DETECTED Final   Parainfluenza Virus 3 NOT DETECTED NOT DETECTED Final   Parainfluenza Virus 4 NOT DETECTED NOT DETECTED Final   Respiratory Syncytial Virus NOT DETECTED NOT DETECTED Final   Bordetella pertussis NOT DETECTED NOT DETECTED Final   Chlamydophila pneumoniae NOT DETECTED NOT DETECTED Final   Mycoplasma pneumoniae NOT DETECTED NOT DETECTED Final    Comment: Performed at [facility], [place] [address] [place],  [address]  Culture, blood (routine x 2) Call MD if unable to obtain prior to antibiotics being given     Status: None (Preliminary result)   Collection Time: 06/08/17  6:28 AM  Result Value Ref Range Status   Specimen Description BLOOD SITE NOT  SPECIFIED  Final   Special Requests   Final    BOTTLES DRAWN AEROBIC ONLY Blood  Culture adequate volume   Culture   Final    NO GROWTH 4 DAYS Performed at [facility], [place] [address], [place], [place] [address]    Report Status PENDING  Incomplete  Culture, blood (routine x 2) Call MD if unable to obtain prior to antibiotics being given     Status: None (Preliminary result)   Collection Time: 06/08/17  6:31 AM  Result Value Ref Range Status   Specimen Description BLOOD SITE NOT SPECIFIED  Final   Special Requests   Final    BOTTLES DRAWN AEROBIC ONLY Blood Culture adequate volume   Culture   Final    NO GROWTH 4 DAYS Performed at [facility], [place] [address] [place], [place] [address]    Report Status PENDING  Incomplete     Radiological Exams on Admission: Dg Chest 2 View  Result Date: 06/13/2017 CLINICAL DATA:  Shortness of breath for 5 years.  History of COPD. EXAM: CHEST - 2 VIEW COMPARISON:  Chest radiograph Jun 07, 2017 FINDINGS: Cardiomediastinal silhouette is normal. Increased lung volumes with flattened hemidiaphragms. No pleural effusions or focal consolidations. Trachea projects midline and there is no pneumothorax. Soft tissue planes and included osseous structures are non-suspicious. Old RIGHT rib fractures. IMPRESSION: COPD, no acute cardiopulmonary process. Electronically Signed   By: Elon Alas M.D.   On: 06/13/2017 00:42    EKG: Independently reviewed.  Normal sinus rhythm with ST-T changes compatible with old EKG.  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 Acute COPD exacerbation -this will be the fourth admission this month.  Patient states he has been taking his prednisone antibiotics and nebulizer.  We will continue IV Solu-Medrol nebulizer Pulmicort and doxycycline.  Patient denies any productive sputum.   Closely observe. 2. Urinary hesitancy likely from BPH.  Check UA.  Bladder scan showed some residual urine but was able to void. 3. Hyperlipidemia on statins. 4. Normocytic normochromic anemia will check anemia panel. 5. Bipolar disorder on Abilify and Lexapro. 6. Steroid-induced hyperglycemia with last hemoglobin A1c checked in April 29, 2017 was around 6.5.  Patient is placed on sliding scale coverage. 7. History of alcohol abuse.  Patient states he has not had alcohol for long time.   DVT prophylaxis: Lovenox. Code Status: Full code. Family Communication: Discussed with patient. Disposition Plan: Home. Consults called: None. Admission status: Observation.   Rise Patience MD [facility] Pager [phone].  If 7PM-7AM, please contact night-coverage [email or web address] Password [facility]  06/13/2017, 3:35 AM

## 2017-06-13 NOTE — ED Notes (Signed)
Admitting paged about sugar

## 2017-06-13 NOTE — Progress Notes (Signed)
Paged MD to inform of rechecked blood sugar 458 one hour after 23units given.  MD stated to give 20 units of novolog insulin and recheck CBG Pt aware NT aware

## 2017-06-13 NOTE — Progress Notes (Signed)
Paged MD for pt CBG 460

## 2017-06-13 NOTE — Progress Notes (Signed)
Patient is alert oriented lives at [facility] group home, has been DX with COPD and Prostate cancer, hard to urinate and is a delay in urologist consult d/t missed appt d/t previous hospital admission., also [facility]. Plan to get Iv steroids, neb treatment and control blood sugar. And monitor for urinal retention.

## 2017-06-13 NOTE — ED Notes (Signed)
Respiratory at bedside.

## 2017-06-13 NOTE — Progress Notes (Signed)
PROGRESS NOTE    Taylor [name]  ELF:[id number] DOB: [birth year] DOA: 06/12/2017 PCP: [facility]    Brief Narrative:  62 y.o. male with history of COPD, hyperlipidemia, bipolar disorder and previous history of alcohol abuse presents to the ER with complaint of shortness of breath.  Patient has been admitted 3 times this month for COPD exacerbation.  During the first visit patient had CT angiogram of the chest which was negative for PE.  Patient states he did well during the last discharge 4 days ago and started having again shortness of breath over the last 2 days.  Denies any chest pain productive cough fever or chills.  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 Acute COPD exacerbation  1. this will be the fourth admission this month.   2. Patient reportedly had been taking prednisone antibiotics and nebulizer.   3. Continue IV Solu-Medrol nebulizer Pulmicort and doxycycline. 4. Remains hypoxemic and O2 dependent over baseline 3 L nasal cannula.  Continue to wean O2 as tolerated 2. Urinary hesitancy likely from BPH.   1. Bladder scan demonstrated around 200 cc of urine, patient voiding 2. Will start patient on Flomax 3. Follow serial bladder scan 3. Hyperlipidemia  1. Continue statin as tolerated. 4. Normocytic normochromic anemia  1. Hemodynamically stable 5. Bipolar disorder  1. Continue patient on Abilify and Lexapro. 6. Steroid-induced hyperglycemia  1. last hemoglobin A1c checked in April 29, 2017 was around 6.5. 2. Glucose presently very poorly controlled 3. Have started patient on scheduled basal Lantus with pre-meal coverage, increase sliding scale insulin to resistant scale 7. History of alcohol abuse.   1. Patient states he has not had alcohol for long  time. 2. Presently stable  DVT prophylaxis: Lovenox subcu Code Status: Partial code Family Communication: Patient in room, family not at bedside Disposition Plan: Uncertain at this time  Consultants:     Procedures:     Antimicrobials: Anti-infectives (From admission, onward)   Start     Dose/Rate Route Frequency Ordered Stop   06/13/17 0500  doxycycline (VIBRAMYCIN) 100 mg in sodium chloride 0.9 % 250 mL IVPB     100 mg 125 mL/hr over 120 Minutes Intravenous Every 12 hours 06/13/17 0336         Subjective: Initially felt much better and wanted to go home, however later reported increased shortness of breath when ambulating to bathroom  Objective: Vitals:   06/13/17 1215 06/13/17 1251 06/13/17 1253 06/13/17 1505  BP: 134/81  114/88   Pulse: (!) 120  (!) 119 (!) 121  Resp: (!) 30  20 20   Temp:   98 F (36.7 C)   TempSrc:   Oral   SpO2: 97%  94% 98%  Weight:  71.2 kg (156 lb 14.4 oz)    Height:  5\' 11"  (1.803 m)      Intake/Output Summary (Last 24 hours) at 06/13/2017 1732 Last data filed at 06/13/2017 1717 Gross per 24 hour  Intake 740 ml  Output 1520 ml  Net -780 ml   Filed Weights   06/12/17 2307 06/13/17 1251  Weight: 68 kg (150 lb) 71.2 kg (156 lb 14.4 oz)    Examination:  General exam: Appears calm and comfortable  Respiratory system: Clear, decreased breath sounds, normal respiratory effort on nasal  cannula Cardiovascular system: S1 & S2 heard, RRR. Gastrointestinal system: Abdomen is nondistended, soft and nontender. No organomegaly or masses felt. Normal bowel sounds heard. Central nervous system: Alert and oriented. No focal neurological deficits. Extremities: Symmetric 5 x 5 power. Skin: No rashes, lesions Psychiatry: Judgement and insight appear normal. Mood & affect appropriate.   Data Reviewed: I have personally reviewed following labs and imaging studies  CBC: Recent Labs  Lab 06/07/17 2258 06/12/17 2318 06/13/17 0344  WBC 7.5 9.2  7.7  HGB 12.4* 12.3* 12.2*  HCT 38.4* 37.7* 37.6*  MCV 94.3 95.2 94.0  PLT 200 212 093   Basic Metabolic Panel: Recent Labs  Lab 06/07/17 2258 06/12/17 2318 06/13/17 0344 06/13/17 1313  NA 138 137 136 136  K 4.3 4.7 4.6 4.6  CL 91* 95* 94* 93*  CO2 42* 36* 34* 33*  GLUCOSE 302* 219* 323* 372*  BUN 10 29* 26* 25*  CREATININE 0.71 0.94 0.86 0.90  CALCIUM 8.7* 8.6* 8.6* 8.6*   GFR: Estimated Creatinine Clearance: 85.7 mL/min (by C-G formula based on SCr of 0.9 mg/dL). Liver Function Tests: Recent Labs  Lab 06/12/17 2318 06/13/17 1313  AST 21 17  ALT 28 28  ALKPHOS 67 70  BILITOT 0.7 0.7  PROT 5.3* 5.5*  ALBUMIN 3.6 3.5   Recent Labs  Lab 06/12/17 2318  LIPASE 40   No results for input(s): AMMONIA in the last 168 hours. Coagulation Profile: No results for input(s): INR, PROTIME in the last 168 hours. Cardiac Enzymes: Recent Labs  Lab 06/13/17 1003  TROPONINI <0.03   BNP (last 3 results) No results for input(s): PROBNP in the last 8760 hours. HbA1C: No results for input(s): HGBA1C in the last 72 hours. CBG: Recent Labs  Lab 06/09/17 0729 06/09/17 1204 06/13/17 0804 06/13/17 1148 06/13/17 1650  GLUCAP 291* 235* 371* 406* 460*   Lipid Profile: No results for input(s): CHOL, HDL, LDLCALC, TRIG, CHOLHDL, LDLDIRECT in the last 72 hours. Thyroid Function Tests: No results for input(s): TSH, T4TOTAL, FREET4, T3FREE, THYROIDAB in the last 72 hours. Anemia Panel: Recent Labs    06/13/17 1003  VITAMINB12 351  FOLATE 15.3  FERRITIN 154  TIBC 281  IRON 52  RETICCTPCT 2.0   Sepsis Labs: No results for input(s): PROCALCITON, LATICACIDVEN in the last 168 hours.  Recent Results (from the past 240 hour(s))  Respiratory Panel by PCR     Status: None   Collection Time: 06/08/17  5:00 AM  Result Value Ref Range Status   Adenovirus NOT DETECTED NOT DETECTED Final   Coronavirus 229E NOT DETECTED NOT DETECTED Final   Coronavirus HKU1 NOT DETECTED NOT  DETECTED Final   Coronavirus NL63 NOT DETECTED NOT DETECTED Final   Coronavirus OC43 NOT DETECTED NOT DETECTED Final   Metapneumovirus NOT DETECTED NOT DETECTED Final   Rhinovirus / Enterovirus NOT DETECTED NOT DETECTED Final   Influenza A NOT DETECTED NOT DETECTED Final   Influenza B NOT DETECTED NOT DETECTED Final   Parainfluenza Virus 1 NOT DETECTED NOT DETECTED Final   Parainfluenza Virus 2 NOT DETECTED NOT DETECTED Final   Parainfluenza Virus 3 NOT DETECTED NOT DETECTED Final   Parainfluenza Virus 4 NOT DETECTED NOT DETECTED Final   Respiratory Syncytial Virus NOT DETECTED NOT DETECTED Final   Bordetella pertussis NOT DETECTED NOT DETECTED Final   Chlamydophila pneumoniae NOT DETECTED NOT DETECTED Final   Mycoplasma pneumoniae NOT DETECTED NOT DETECTED Final    Comment: Performed at [facility], [address], [place], [place] [address]  Culture, blood (routine x 2) Call MD if unable to obtain prior to antibiotics being given     Status: None   Collection Time: 06/08/17  6:28 AM  Result Value Ref Range Status   Specimen Description BLOOD SITE NOT SPECIFIED  Final   Special Requests   Final    BOTTLES DRAWN AEROBIC ONLY Blood Culture adequate volume   Culture   Final    NO GROWTH 5 DAYS Performed at [facility], [place] [address] [place], [place] [address]    Report Status 06/13/2017 FINAL  Final  Culture, blood (routine x 2) Call MD if unable to obtain prior to antibiotics being given     Status: None   Collection Time: 06/08/17  6:31 AM  Result Value Ref Range Status   Specimen Description BLOOD SITE NOT SPECIFIED  Final   Special Requests   Final    BOTTLES DRAWN AEROBIC ONLY Blood Culture adequate volume   Culture   Final    NO GROWTH 5 DAYS Performed at [facility], [place] [address] [place], [place] [address]    Report Status 06/13/2017 FINAL  Final  MRSA PCR Screening     Status: None   Collection Time: 06/13/17  1:05 PM  Result Value Ref Range  Status   MRSA by PCR NEGATIVE NEGATIVE Final    Comment:        The GeneXpert MRSA Assay (FDA approved for NASAL specimens only), is one component of a comprehensive MRSA colonization surveillance program. It is not intended to diagnose MRSA infection nor to guide or monitor treatment for MRSA infections. Performed at [facility], [place] [address], [place], [place] [address]      Radiology Studies: Dg Chest 2 View  Result Date: 06/13/2017 CLINICAL DATA:  Shortness of breath for 5 years.  History of COPD. EXAM: CHEST - 2 VIEW COMPARISON:  Chest radiograph Jun 07, 2017 FINDINGS: Cardiomediastinal silhouette is normal. Increased lung volumes with flattened hemidiaphragms. No pleural effusions or focal consolidations. Trachea projects midline and there is no pneumothorax. Soft tissue planes and included osseous structures are non-suspicious. Old RIGHT rib fractures. IMPRESSION: COPD, no acute cardiopulmonary process. Electronically Signed   By: Elon Alas M.D.   On: 06/13/2017 00:42    Scheduled Meds: . ARIPiprazole  5 mg Oral Daily  . budesonide (PULMICORT) nebulizer solution  0.25 mg Nebulization BID  . enoxaparin (LOVENOX) injection  40 mg Subcutaneous Q24H  . escitalopram  20 mg Oral Daily  . feeding supplement (ENSURE ENLIVE)  237 mL Oral BID BM  . insulin aspart  0-20 Units Subcutaneous TID WC  . insulin aspart  0-5 Units Subcutaneous QHS  . [START ON 06/14/2017] insulin aspart  5 Units Subcutaneous TID WC  . [START ON 06/14/2017] insulin glargine  20 Units Subcutaneous Daily  . ipratropium-albuterol  3 mL Nebulization Q4H  . methylPREDNISolone (SOLU-MEDROL) injection  40 mg Intravenous Q6H  . pravastatin  20 mg Oral q1800   Continuous Infusions: . doxycycline (VIBRAMYCIN) IV 100 mg (06/13/17 1717)     LOS: 0 days   Marylu Lund, MD [facility] Pager [phone]  If 7PM-7AM, please contact night-coverage [email or web address] Password [facility] 06/13/2017, 5:32  PM

## 2017-06-13 NOTE — ED Notes (Signed)
Campos informed of patient increased heart heart. New EKG ordered.

## 2017-06-13 NOTE — ED Notes (Signed)
ED Provider at bedside. 

## 2017-06-13 NOTE — Progress Notes (Signed)
MD aware of pt from group home, stated okay to place order set for MRSA  MD aware of pt retaining 245ml in ED, and history of prostate cancer, states no need for urology consult at this time

## 2017-06-13 NOTE — ED Notes (Signed)
Bladder scan for 245ml

## 2017-06-13 NOTE — ED Notes (Signed)
Respiratory paged to reevaluate patient

## 2017-06-13 NOTE — Progress Notes (Signed)
Initial Nutrition Assessment  DOCUMENTATION CODES:   Non-severe (moderate) malnutrition in context of chronic illness  INTERVENTION:   Ensure Enlive po BID, each supplement provides 350 kcal and 20 grams of protein   NUTRITION DIAGNOSIS:   Moderate Malnutrition related to chronic illness(COPD) as evidenced by mild fat depletion, mild muscle depletion.  GOAL:   Patient will meet greater than or equal to 90% of their needs  MONITOR:   PO intake, Supplement acceptance, Labs, Weight trends  REASON FOR ASSESSMENT:   Malnutrition Screening Tool, Consult Assessment of nutrition requirement/status  ASSESSMENT:   62 yo male admitted with COPD exacerbation. Pt with hx of COPD, HLD, bipolar disorder, EtOH abuse, colon cancer s/p sigmoid colectomy 2013, hernia repair   No recorded po intake. Pt reports he eats 1-2 meals per day; a typical meal might be a sandwich. Reports appetite is always good in the hospital but reports appetite not well at home. C/o SOB, poor appetite.  Pt reports UBW around 170-180 pounds. Current wt 156 pounds. Weight encounters fluctuate greatly; current wt 156 pounds, 150 pounds on 5.10, 167 pounds on 5/1 and 3/17 of this year.   Labs: CBGs 235-406 Meds: solumedrol, ss novolog, lantus  NUTRITION - FOCUSED PHYSICAL EXAM:    Most Recent Value  Orbital Region  Mild depletion  Upper Arm Region  Moderate depletion  Thoracic and Lumbar Region  No depletion  Buccal Region  Mild depletion  Temple Region  No depletion  Clavicle Bone Region  Mild depletion  Clavicle and Acromion Bone Region  Mild depletion  Scapular Bone Region  Mild depletion  Dorsal Hand  Mild depletion  Patellar Region  Mild depletion  Anterior Thigh Region  Mild depletion  Posterior Calf Region  Mild depletion       Diet Order:   Diet Order           Diet Heart Room service appropriate? Yes; Fluid consistency: Thin  Diet effective now          EDUCATION NEEDS:   Not appropriate  for education at this time  Skin:  Skin Assessment: Reviewed RN Assessment  Last BM:  5/23  Height:   Ht Readings from Last 1 Encounters:  06/13/17 5\' 11"  (1.803 m)    Weight:   Wt Readings from Last 1 Encounters:  06/13/17 156 lb 14.4 oz (71.2 kg)    Ideal Body Weight:     BMI:  Body mass index is 21.88 kg/m.  Estimated Nutritional Needs:   Kcal:  1775-2130 kcals   Protein:  89-107 g  Fluid:  >/= 1.8 L   Kerman Passey MS, RD, LDN, CNSC [phone] Pager  [phone] Weekend/On-Call Pager

## 2017-06-13 NOTE — Progress Notes (Signed)
MD aware states no need for blood draw to confirm  Give 23 units per scheduled sliding scale and meal coverage then re check  Will continue to monitor

## 2017-06-14 DIAGNOSIS — E440 Moderate protein-calorie malnutrition: Secondary | ICD-10-CM

## 2017-06-14 LAB — BASIC METABOLIC PANEL
ANION GAP: 6 (ref 5–15)
BUN: 23 mg/dL — ABNORMAL HIGH (ref 6–20)
CALCIUM: 8.4 mg/dL — AB (ref 8.9–10.3)
CO2: 34 mmol/L — AB (ref 22–32)
Chloride: 98 mmol/L — ABNORMAL LOW (ref 101–111)
Creatinine, Ser: 0.64 mg/dL (ref 0.61–1.24)
GLUCOSE: 228 mg/dL — AB (ref 65–99)
POTASSIUM: 4.3 mmol/L (ref 3.5–5.1)
SODIUM: 138 mmol/L (ref 135–145)

## 2017-06-14 LAB — GLUCOSE, CAPILLARY
GLUCOSE-CAPILLARY: 302 mg/dL — AB (ref 65–99)
GLUCOSE-CAPILLARY: 204 mg/dL — AB (ref 65–99)
Glucose-Capillary: 274 mg/dL — ABNORMAL HIGH (ref 65–99)
Glucose-Capillary: 201 mg/dL — ABNORMAL HIGH (ref 65–99)

## 2017-06-14 MED ORDER — INSULIN ASPART 100 UNIT/ML ~~LOC~~ SOLN: 8.0000 [IU] | Freq: Three times a day (TID) | SUBCUTANEOUS | Status: DC

## 2017-06-14 MED ORDER — IPRATROPIUM-ALBUTEROL 0.5-2.5 (3) MG/3ML IN SOLN
3.0000 mL | Freq: Two times a day (BID) | RESPIRATORY_TRACT | Status: DC
  Administered 2017-06-14 – 2017-06-16 (×5): 3 mL via RESPIRATORY_TRACT
  Filled 2017-06-14 (×5): qty 3

## 2017-06-14 MED ORDER — INSULIN ASPART 100 UNIT/ML ~~LOC~~ SOLN
10.0000 [IU] | Freq: Three times a day (TID) | SUBCUTANEOUS | Status: DC
  Administered 2017-06-14 – 2017-06-16 (×7): 10 [IU] via SUBCUTANEOUS

## 2017-06-14 MED ORDER — INSULIN GLARGINE 100 UNIT/ML ~~LOC~~ SOLN
24.0000 [IU] | Freq: Every day | SUBCUTANEOUS | Status: DC
  Administered 2017-06-15 – 2017-06-17 (×3): 24 [IU] via SUBCUTANEOUS
  Filled 2017-06-14 (×3): qty 0.24

## 2017-06-14 NOTE — Progress Notes (Signed)
PROGRESS NOTE    Taylor [name]  QQV:[id number] DOB: [birth year] DOA: 06/12/2017 PCP: [facility]    Brief Narrative:  62 y.o. male with history of COPD, hyperlipidemia, bipolar disorder and previous history of alcohol abuse presents to the ER with complaint of shortness of breath.  Patient has been admitted 3 times this month for COPD exacerbation.  During the first visit patient had CT angiogram of the chest which was negative for PE.  Patient states he did well during the last discharge 4 days ago and started having again shortness of breath over the last 2 days.  Denies any chest pain productive cough fever or chills.  Assessment & Plan:   Principal Problem:   COPD with acute exacerbation ([place]) Active Problems:   Steroid-induced diabetes mellitus (HCC)   Bipolar 1 disorder (HCC)   COPD exacerbation (HCC)   Normocytic normochromic anemia   Malnutrition of moderate degree   1. Acute COPD exacerbation  1. Patient is currently in his fourth admission this month.   2. Patient reportedly had been taking prednisone antibiotics and nebulizer.   3. Continue IV Solu-Medrol nebulizer Pulmicort and doxycycline. 4. Remains hypoxemic and O2 dependent over baseline 3 L nasal cannula. 5. Need to wean O2 as patient tolerates 2. Urinary hesitancy likely from BPH.   1. Bladder scan demonstrated around 200 cc of urine, patient voiding 2. Patient currently on Flomax 3. To follow bladder scan, if continues to retain urine, would benefit from indwelling Foley catheter 3. Hyperlipidemia  1. Continue statin as patient tolerates 4. Normocytic normochromic anemia  1. Hemodynamically stable at this time 5. Bipolar disorder  1. Continue patient on Abilify and Lexapro as tolerated. 6. Steroid-induced hyperglycemia  1. last hemoglobin A1c checked in April 29, 2017 was around 6.5. 2. Glucose to be poorly controlled while on diuretics.  Continue to titrate insulin as  needed 7. History of alcohol abuse.   1. Patient states he has not had alcohol for long time. 2. Presently stable at this time  DVT prophylaxis: Lovenox subcu Code Status: Partial code Family Communication: Patient in room, family not at bedside Disposition Plan: Uncertain at this time  Consultants:     Procedures:     Antimicrobials: Anti-infectives (From admission, onward)   Start     Dose/Rate Route Frequency Ordered Stop   06/13/17 0500  doxycycline (VIBRAMYCIN) 100 mg in sodium chloride 0.9 % 250 mL IVPB     100 mg 125 mL/hr over 120 Minutes Intravenous Every 12 hours 06/13/17 0336        Subjective: Complained of increased shortness of breath earlier this morning.  Objective: Vitals:   06/14/17 0850 06/14/17 1218 06/14/17 1433 06/14/17 1658  BP:  99/68    Pulse:  92    Resp:  18    Temp:  98.2 F (36.8 C)    TempSrc:  Oral    SpO2: 96% 97% 97% 96%  Weight:      Height:        Intake/Output Summary (Last 24 hours) at 06/14/2017 1818 Last data filed at 06/14/2017 1255 Gross per 24 hour  Intake 1180 ml  Output 1875 ml  Net -695 ml   Filed Weights   06/12/17 2307 06/13/17 1251 06/14/17 0405  Weight: 68 kg (150 lb) 71.2 kg (156 lb 14.4 oz) 71.5 kg (157 lb 10.1 oz)    Examination: General exam: Awake, laying in bed, in nad Respiratory system: Normal respiratory effort, no wheezing Cardiovascular  system: regular rate, s1, s2 Gastrointestinal system: Soft, nondistended, positive BS Central nervous system: CN2-12 grossly intact, strength intact Extremities: Perfused, no clubbing Skin: Normal skin turgor, no notable skin lesions seen Psychiatry: Mood normal // no visual hallucinations   Data Reviewed: I have personally reviewed following labs and imaging studies  CBC: Recent Labs  Lab 06/07/17 2258 06/12/17 2318 06/13/17 0344  WBC 7.5 9.2 7.7  HGB 12.4* 12.3* 12.2*  HCT 38.4* 37.7* 37.6*  MCV 94.3 95.2 94.0  PLT 200 212 062   Basic Metabolic  Panel: Recent Labs  Lab 06/07/17 2258 06/12/17 2318 06/13/17 0344 06/13/17 1313 06/14/17 0324  NA 138 137 136 136 138  K 4.3 4.7 4.6 4.6 4.3  CL 91* 95* 94* 93* 98*  CO2 42* 36* 34* 33* 34*  GLUCOSE 302* 219* 323* 372* 228*  BUN 10 29* 26* 25* 23*  CREATININE 0.71 0.94 0.86 0.90 0.64  CALCIUM 8.7* 8.6* 8.6* 8.6* 8.4*   GFR: Estimated Creatinine Clearance: 96.8 mL/min (by C-G formula based on SCr of 0.64 mg/dL). Liver Function Tests: Recent Labs  Lab 06/12/17 2318 06/13/17 1313  AST 21 17  ALT 28 28  ALKPHOS 67 70  BILITOT 0.7 0.7  PROT 5.3* 5.5*  ALBUMIN 3.6 3.5   Recent Labs  Lab 06/12/17 2318  LIPASE 40   No results for input(s): AMMONIA in the last 168 hours. Coagulation Profile: No results for input(s): INR, PROTIME in the last 168 hours. Cardiac Enzymes: Recent Labs  Lab 06/13/17 1003  TROPONINI <0.03   BNP (last 3 results) No results for input(s): PROBNP in the last 8760 hours. HbA1C: No results for input(s): HGBA1C in the last 72 hours. CBG: Recent Labs  Lab 06/13/17 1902 06/13/17 2103 06/14/17 0739 06/14/17 1204 06/14/17 1630  GLUCAP 346* 128* 302* 204* 274*   Lipid Profile: No results for input(s): CHOL, HDL, LDLCALC, TRIG, CHOLHDL, LDLDIRECT in the last 72 hours. Thyroid Function Tests: No results for input(s): TSH, T4TOTAL, FREET4, T3FREE, THYROIDAB in the last 72 hours. Anemia Panel: Recent Labs    06/13/17 1003  VITAMINB12 351  FOLATE 15.3  FERRITIN 154  TIBC 281  IRON 52  RETICCTPCT 2.0   Sepsis Labs: No results for input(s): PROCALCITON, LATICACIDVEN in the last 168 hours.  Recent Results (from the past 240 hour(s))  Respiratory Panel by PCR     Status: None   Collection Time: 06/08/17  5:00 AM  Result Value Ref Range Status   Adenovirus NOT DETECTED NOT DETECTED Final   Coronavirus 229E NOT DETECTED NOT DETECTED Final   Coronavirus HKU1 NOT DETECTED NOT DETECTED Final   Coronavirus NL63 NOT DETECTED NOT DETECTED Final    Coronavirus OC43 NOT DETECTED NOT DETECTED Final   Metapneumovirus NOT DETECTED NOT DETECTED Final   Rhinovirus / Enterovirus NOT DETECTED NOT DETECTED Final   Influenza A NOT DETECTED NOT DETECTED Final   Influenza B NOT DETECTED NOT DETECTED Final   Parainfluenza Virus 1 NOT DETECTED NOT DETECTED Final   Parainfluenza Virus 2 NOT DETECTED NOT DETECTED Final   Parainfluenza Virus 3 NOT DETECTED NOT DETECTED Final   Parainfluenza Virus 4 NOT DETECTED NOT DETECTED Final   Respiratory Syncytial Virus NOT DETECTED NOT DETECTED Final   Bordetella pertussis NOT DETECTED NOT DETECTED Final   Chlamydophila pneumoniae NOT DETECTED NOT DETECTED Final   Mycoplasma pneumoniae NOT DETECTED NOT DETECTED Final    Comment: Performed at [facility], [address], [place], [place] [address]  Culture, blood (routine x 2) Call MD if unable to obtain prior to antibiotics being given     Status: None   Collection Time: 06/08/17  6:28 AM  Result Value Ref Range Status   Specimen Description BLOOD SITE NOT SPECIFIED  Final   Special Requests   Final    BOTTLES DRAWN AEROBIC ONLY Blood Culture adequate volume   Culture   Final    NO GROWTH 5 DAYS Performed at [facility], [address], [place], [place] [address]    Report Status 06/13/2017 FINAL  Final  Culture, blood (routine x 2) Call MD if unable to obtain prior to antibiotics being given     Status: None   Collection Time: 06/08/17  6:31 AM  Result Value Ref Range Status   Specimen Description BLOOD SITE NOT SPECIFIED  Final   Special Requests   Final    BOTTLES DRAWN AEROBIC ONLY Blood Culture adequate volume   Culture   Final    NO GROWTH 5 DAYS Performed at [facility], [place] [address], [place], [place] [address]    Report Status 06/13/2017 FINAL  Final  MRSA PCR Screening     Status: None   Collection Time: 06/13/17  1:05 PM  Result Value Ref Range Status   MRSA by PCR NEGATIVE NEGATIVE Final    Comment:        The  GeneXpert MRSA Assay (FDA approved for NASAL specimens only), is one component of a comprehensive MRSA colonization surveillance program. It is not intended to diagnose MRSA infection nor to guide or monitor treatment for MRSA infections. Performed at [facility], [place] [address] [place], [place] [address]      Radiology Studies: Dg Chest 2 View  Result Date: 06/13/2017 CLINICAL DATA:  Shortness of breath for 5 years.  History of COPD. EXAM: CHEST - 2 VIEW COMPARISON:  Chest radiograph Jun 07, 2017 FINDINGS: Cardiomediastinal silhouette is normal. Increased lung volumes with flattened hemidiaphragms. No pleural effusions or focal consolidations. Trachea projects midline and there is no pneumothorax. Soft tissue planes and included osseous structures are non-suspicious. Old RIGHT rib fractures. IMPRESSION: COPD, no acute cardiopulmonary process. Electronically Signed   By: Elon Alas M.D.   On: 06/13/2017 00:42    Scheduled Meds: . ARIPiprazole  5 mg Oral Daily  . budesonide (PULMICORT) nebulizer solution  0.25 mg Nebulization BID  . enoxaparin (LOVENOX) injection  40 mg Subcutaneous Q24H  . escitalopram  20 mg Oral Daily  . feeding supplement (ENSURE ENLIVE)  237 mL Oral BID BM  . insulin aspart  0-20 Units Subcutaneous TID WC  . insulin aspart  0-5 Units Subcutaneous QHS  . insulin aspart  10 Units Subcutaneous TID WC  . insulin glargine  20 Units Subcutaneous Daily  . ipratropium-albuterol  3 mL Nebulization BID  . methylPREDNISolone (SOLU-MEDROL) injection  40 mg Intravenous Q6H  . pravastatin  20 mg Oral q1800  . tamsulosin  0.4 mg Oral Daily   Continuous Infusions: . doxycycline (VIBRAMYCIN) IV Stopped (06/14/17 8250)     LOS: 1 day   Marylu Lund, MD [facility] Pager [phone]  If 7PM-7AM, please contact night-coverage [email or web address] Password [email or web address] 06/14/2017, 6:18 PM

## 2017-06-14 NOTE — Clinical Social Work Note (Signed)
CSW acknowledges consult that patient is from a group home and may need assistance with placement. Patient is from [facility]. If there are concerns patient needs SNF, please order PT evaluation.  CSW signing off for now. Consult again if social work needs arise.  Dayton Scrape, [place]

## 2017-06-14 NOTE — Progress Notes (Addendum)
Bladder scan done per order and showed 490 ml. Patient complains of pressure in bladder area and urge to void but he is unable to do so. Peri care and I and O cath done per order with two RN's. Output 300 ml.   Will continue to monitor.  Sewell Pitner, RN

## 2017-06-15 LAB — BASIC METABOLIC PANEL
Anion gap: 7 (ref 5–15)
BUN: 20 mg/dL (ref 6–20)
CALCIUM: 8.4 mg/dL — AB (ref 8.9–10.3)
CO2: 33 mmol/L — ABNORMAL HIGH (ref 22–32)
Chloride: 95 mmol/L — ABNORMAL LOW (ref 101–111)
Creatinine, Ser: 0.7 mg/dL (ref 0.61–1.24)
GFR calc Af Amer: >60 mL/min (ref >60)
GLUCOSE: 232 mg/dL — AB (ref 65–99)
Potassium: 4.4 mmol/L (ref 3.5–5.1)
Sodium: 135 mmol/L (ref 135–145)

## 2017-06-15 LAB — GLUCOSE, CAPILLARY
GLUCOSE-CAPILLARY: 254 mg/dL — AB (ref 65–99)
GLUCOSE-CAPILLARY: 84 mg/dL (ref 65–99)
Glucose-Capillary: 214 mg/dL — ABNORMAL HIGH (ref 65–99)
Glucose-Capillary: 224 mg/dL — ABNORMAL HIGH (ref 65–99)

## 2017-06-15 MED ORDER — METHYLPREDNISOLONE SODIUM SUCC 40 MG IJ SOLR
40.0000 mg | Freq: Two times a day (BID) | INTRAMUSCULAR | Status: DC
  Administered 2017-06-15: 40 mg via INTRAVENOUS
  Filled 2017-06-15: qty 1

## 2017-06-15 NOTE — Progress Notes (Addendum)
SATURATION QUALIFICATIONS: (This note is used to comply with regulatory documentation for home oxygen)  Patient Saturations on Room Air at Rest = 92%  Patient Saturations on Room Air while Ambulating = 90%  Patient Saturations on 3 Liters of oxygen while Ambulating = 94-95%  Patient walked 3 step off oxygen before having to sit down.  Shaking, SOB Placed O2 3 L - walked down to end of hall- O2 stayed on 94%-95%  Heart rate was 99 but jumped up to 135-140s while ambulating

## 2017-06-15 NOTE — Progress Notes (Signed)
PROGRESS NOTE    Taylor [name]  YNW:[id number] DOB: 1955-07-13 DOA: 06/12/2017 PCP: [facility]    Brief Narrative:  62 y.o. male with history of COPD, hyperlipidemia, bipolar disorder and previous history of alcohol abuse presents to the ER with complaint of shortness of breath.  Patient has been admitted 3 times this month for COPD exacerbation.  During the first visit patient had CT angiogram of the chest which was negative for PE.  Patient states he did well during the last discharge 4 days ago and started having again shortness of breath over the last 2 days.  Denies any chest pain productive cough fever or chills.  Assessment & Plan:   Principal Problem:   COPD with acute exacerbation ([place]) Active Problems:   Steroid-induced diabetes mellitus (HCC)   Bipolar 1 disorder (HCC)   COPD exacerbation (HCC)   Normocytic normochromic anemia   Malnutrition of moderate degree   1. Acute COPD exacerbation  1. this will be the fourth admission this month.   2. Patient reportedly had been taking prednisone antibiotics and nebulizer.   3. Continue IV Solu-Medrol nebulizer Pulmicort and doxycycline. 4. Remains hypoxemic and O2 dependent over baseline 3 L nasal cannula.  Continue to wean O2 as tolerated 5. Improved 2. Urinary hesitancy likely from BPH.   1. Bladder scan demonstrated around 200 cc of urine, patient voiding 2. Will start patient on Flomax 3. Follow serial bladder scan 4. Improved 3. Hyperlipidemia  1. Continue statin as tolerated. 2. Stable 4. Normocytic normochromic anemia  1. Hemodynamically stable 2. stable 5. Bipolar disorder  1. Continue patient on Abilify and Lexapro. 2. stable 6. Steroid-induced hyperglycemia  1. last hemoglobin A1c checked in April 29, 2017 was around 6.5. 2. Glucose presently very poorly controlled 3. Have started patient on scheduled basal Lantus with pre-meal coverage, increase sliding scale insulin to resistant  scale 4. Stable 7. History of alcohol abuse.   1. Patient states he has not had alcohol for long time. 2. stable  DVT prophylaxis: Lovenox subcu Code Status: Partial code Family Communication: Patient in room, family not at bedside Disposition Plan: Uncertain at this time  Consultants:     Procedures:     Antimicrobials: Anti-infectives (From admission, onward)   Start     Dose/Rate Route Frequency Ordered Stop   06/13/17 0500  doxycycline (VIBRAMYCIN) 100 mg in sodium chloride 0.9 % 250 mL IVPB     100 mg 125 mL/hr over 120 Minutes Intravenous Every 12 hours 06/13/17 0336        Subjective: Increased SOB when ambulating with RN  Objective: Vitals:   06/15/17 0838 06/15/17 0841 06/15/17 0948 06/15/17 1238  BP:   119/78 118/73  Pulse: 86  94 81  Resp: 18   18  Temp:    98.4 F (36.9 C)  TempSrc:    Oral  SpO2: 96% 96% 96% 98%  Weight:      Height:        Intake/Output Summary (Last 24 hours) at 06/15/2017 1842 Last data filed at 06/15/2017 1534 Gross per 24 hour  Intake 1080 ml  Output 1650 ml  Net -570 ml   Filed Weights   06/13/17 1251 06/14/17 0405 06/15/17 0433  Weight: 71.2 kg (156 lb 14.4 oz) 71.5 kg (157 lb 10.1 oz) 72.5 kg (159 lb 14.4 oz)    Examination: General exam: Conversant, in no acute distress Respiratory system: normal chest rise, clear, no audible wheezing Cardiovascular system: regular  rhythm, s1-s2 Gastrointestinal system: Nondistended, nontender, pos BS Central nervous system: No seizures, no tremors Extremities: No cyanosis, no joint deformities Skin: No rashes, no pallor Psychiatry: Affect normal // no auditory hallucinations   Data Reviewed: I have personally reviewed following labs and imaging studies  CBC: Recent Labs  Lab 06/12/17 2318 06/13/17 0344  WBC 9.2 7.7  HGB 12.3* 12.2*  HCT 37.7* 37.6*  MCV 95.2 94.0  PLT 212 151   Basic Metabolic Panel: Recent Labs  Lab 06/12/17 2318 06/13/17 0344 06/13/17 1313  06/14/17 0324 06/15/17 0455  NA 137 136 136 138 135  K 4.7 4.6 4.6 4.3 4.4  CL 95* 94* 93* 98* 95*  CO2 36* 34* 33* 34* 33*  GLUCOSE 219* 323* 372* 228* 232*  BUN 29* 26* 25* 23* 20  CREATININE 0.94 0.86 0.90 0.64 0.70  CALCIUM 8.6* 8.6* 8.6* 8.4* 8.4*   GFR: Estimated Creatinine Clearance: 98.2 mL/min (by C-G formula based on SCr of 0.7 mg/dL). Liver Function Tests: Recent Labs  Lab 06/12/17 2318 06/13/17 1313  AST 21 17  ALT 28 28  ALKPHOS 67 70  BILITOT 0.7 0.7  PROT 5.3* 5.5*  ALBUMIN 3.6 3.5   Recent Labs  Lab 06/12/17 2318  LIPASE 40   No results for input(s): AMMONIA in the last 168 hours. Coagulation Profile: No results for input(s): INR, PROTIME in the last 168 hours. Cardiac Enzymes: Recent Labs  Lab 06/13/17 1003  TROPONINI <0.03   BNP (last 3 results) No results for input(s): PROBNP in the last 8760 hours. HbA1C: No results for input(s): HGBA1C in the last 72 hours. CBG: Recent Labs  Lab 06/14/17 1630 06/14/17 2122 06/15/17 0712 06/15/17 1239 06/15/17 1710  GLUCAP 274* 201* 214* 254* 224*   Lipid Profile: No results for input(s): CHOL, HDL, LDLCALC, TRIG, CHOLHDL, LDLDIRECT in the last 72 hours. Thyroid Function Tests: No results for input(s): TSH, T4TOTAL, FREET4, T3FREE, THYROIDAB in the last 72 hours. Anemia Panel: Recent Labs    06/13/17 1003  VITAMINB12 351  FOLATE 15.3  FERRITIN 154  TIBC 281  IRON 52  RETICCTPCT 2.0   Sepsis Labs: No results for input(s): PROCALCITON, LATICACIDVEN in the last 168 hours.  Recent Results (from the past 240 hour(s))  Respiratory Panel by PCR     Status: None   Collection Time: 06/08/17  5:00 AM  Result Value Ref Range Status   Adenovirus NOT DETECTED NOT DETECTED Final   Coronavirus 229E NOT DETECTED NOT DETECTED Final   Coronavirus HKU1 NOT DETECTED NOT DETECTED Final   Coronavirus NL63 NOT DETECTED NOT DETECTED Final   Coronavirus OC43 NOT DETECTED NOT DETECTED Final   Metapneumovirus  NOT DETECTED NOT DETECTED Final   Rhinovirus / Enterovirus NOT DETECTED NOT DETECTED Final   Influenza A NOT DETECTED NOT DETECTED Final   Influenza B NOT DETECTED NOT DETECTED Final   Parainfluenza Virus 1 NOT DETECTED NOT DETECTED Final   Parainfluenza Virus 2 NOT DETECTED NOT DETECTED Final   Parainfluenza Virus 3 NOT DETECTED NOT DETECTED Final   Parainfluenza Virus 4 NOT DETECTED NOT DETECTED Final   Respiratory Syncytial Virus NOT DETECTED NOT DETECTED Final   Bordetella pertussis NOT DETECTED NOT DETECTED Final   Chlamydophila pneumoniae NOT DETECTED NOT DETECTED Final   Mycoplasma pneumoniae NOT DETECTED NOT DETECTED Final    Comment: Performed at [facility], [address] [place], [place] [address]  Culture, blood (routine x 2) Call MD if unable to obtain prior to  antibiotics being given     Status: None   Collection Time: 06/08/17  6:28 AM  Result Value Ref Range Status   Specimen Description BLOOD SITE NOT SPECIFIED  Final   Special Requests   Final    BOTTLES DRAWN AEROBIC ONLY Blood Culture adequate volume   Culture   Final    NO GROWTH 5 DAYS Performed at [facility], [address], [place], [place] [address]    Report Status 06/13/2017 FINAL  Final  Culture, blood (routine x 2) Call MD if unable to obtain prior to antibiotics being given     Status: None   Collection Time: 06/08/17  6:31 AM  Result Value Ref Range Status   Specimen Description BLOOD SITE NOT SPECIFIED  Final   Special Requests   Final    BOTTLES DRAWN AEROBIC ONLY Blood Culture adequate volume   Culture   Final    NO GROWTH 5 DAYS Performed at [facility], [place] [address] [place], [place] [address]    Report Status 06/13/2017 FINAL  Final  MRSA PCR Screening     Status: None   Collection Time: 06/13/17  1:05 PM  Result Value Ref Range Status   MRSA by PCR NEGATIVE NEGATIVE Final    Comment:        The GeneXpert MRSA Assay (FDA approved for NASAL specimens only), is one  component of a comprehensive MRSA colonization surveillance program. It is not intended to diagnose MRSA infection nor to guide or monitor treatment for MRSA infections. Performed at [facility], [place] [address], [place], [place] [address]      Radiology Studies: No results found.  Scheduled Meds: . ARIPiprazole  5 mg Oral Daily  . budesonide (PULMICORT) nebulizer solution  0.25 mg Nebulization BID  . enoxaparin (LOVENOX) injection  40 mg Subcutaneous Q24H  . escitalopram  20 mg Oral Daily  . feeding supplement (ENSURE ENLIVE)  237 mL Oral BID BM  . insulin aspart  0-20 Units Subcutaneous TID WC  . insulin aspart  0-5 Units Subcutaneous QHS  . insulin aspart  10 Units Subcutaneous TID WC  . insulin glargine  24 Units Subcutaneous Daily  . ipratropium-albuterol  3 mL Nebulization BID  . methylPREDNISolone (SOLU-MEDROL) injection  40 mg Intravenous Q12H  . pravastatin  20 mg Oral q1800  . tamsulosin  0.4 mg Oral Daily   Continuous Infusions: . doxycycline (VIBRAMYCIN) IV 100 mg (06/15/17 1823)     LOS: 2 days   Marylu Lund, MD [facility] Pager [phone]  If 7PM-7AM, please contact night-coverage [email or web address] Password [facility] 06/15/2017, 6:42 PM

## 2017-06-16 DIAGNOSIS — F319 Bipolar disorder, unspecified: Secondary | ICD-10-CM

## 2017-06-16 LAB — GLUCOSE, CAPILLARY
GLUCOSE-CAPILLARY: 173 mg/dL — AB (ref 65–99)
GLUCOSE-CAPILLARY: 259 mg/dL — AB (ref 65–99)
Glucose-Capillary: 84 mg/dL (ref 65–99)
Glucose-Capillary: 368 mg/dL — ABNORMAL HIGH (ref 65–99)

## 2017-06-16 MED ORDER — METHYLPREDNISOLONE SODIUM SUCC 40 MG IJ SOLR
40.0000 mg | Freq: Every day | INTRAMUSCULAR | Status: DC
  Administered 2017-06-16 – 2017-06-17 (×2): 40 mg via INTRAVENOUS
  Filled 2017-06-16 (×2): qty 1

## 2017-06-16 MED ORDER — DOXYCYCLINE HYCLATE 100 MG PO TABS
100.0000 mg | ORAL_TABLET | Freq: Two times a day (BID) | ORAL | Status: DC
Start: 2017-06-16 — End: 2017-06-18
  Administered 2017-06-16 – 2017-06-17 (×3): 100 mg via ORAL
  Filled 2017-06-16 (×3): qty 1

## 2017-06-16 NOTE — Progress Notes (Signed)
PROGRESS NOTE    Taylor [name]  AVW:[id number] DOB: October 15, 1955 DOA: 06/12/2017 PCP: [facility]    Brief Narrative:  62 y.o. male with history of COPD, hyperlipidemia, bipolar disorder and previous history of alcohol abuse presents to the ER with complaint of shortness of breath.  Patient has been admitted 3 times this month for COPD exacerbation.  During the first visit patient had CT angiogram of the chest which was negative for PE.  Patient states he did well during the last discharge 4 days ago and started having again shortness of breath over the last 2 days.  Denies any chest pain productive cough fever or chills.  Assessment & Plan:   Principal Problem:   COPD with acute exacerbation ([place]) Active Problems:   Steroid-induced diabetes mellitus (HCC)   Bipolar 1 disorder (HCC)   COPD exacerbation (HCC)   Normocytic normochromic anemia   Malnutrition of moderate degree   1. Acute COPD exacerbation  1. this will be the fourth admission this month.   2. Patient reportedly had been taking prednisone antibiotics and nebulizer.   3. Continued on IV Solu-Medrol nebulizer Pulmicort and doxycycline. 4. Remains hypoxemic and O2 dependent over baseline 3 L nasal cannula.  Continue to wean O2 as tolerated 5. Weaning steroids. Will give trial of ambulation 2. Urinary hesitancy likely from BPH.   1. Bladder scan demonstrated around 200 cc of urine, patient voiding 2. Will start patient on Flomax 3. Follow serial bladder scan 4. Improved with flomax 3. Hyperlipidemia  1. Continue statin as tolerated. 2. Stable at this time 4. Normocytic normochromic anemia  1. Hemodynamically stable 2. Remains stable 5. Bipolar disorder  1. Continue patient on Abilify and Lexapro. 2. Currently stable 6. Steroid-induced hyperglycemia  1. last hemoglobin A1c checked in April 29, 2017 was around 6.5. 2. Glucose presently very poorly controlled 3. Have started patient on  scheduled basal Lantus with pre-meal coverage, increase sliding scale insulin to resistant scale 4. Remains stable 7. History of alcohol abuse.   1. Patient states he has not had alcohol for long time. 2. Without signs of withdrawal  DVT prophylaxis: Lovenox subcu Code Status: Partial code Family Communication: Patient in room, family not at bedside Disposition Plan: Uncertain at this time  Consultants:     Procedures:     Antimicrobials: Anti-infectives (From admission, onward)   Start     Dose/Rate Route Frequency Ordered Stop   06/16/17 0800  doxycycline (VIBRA-TABS) tablet 100 mg     100 mg Oral Every 12 hours 06/16/17 0747     06/13/17 0500  doxycycline (VIBRAMYCIN) 100 mg in sodium chloride 0.9 % 250 mL IVPB  Status:  Discontinued     100 mg 125 mL/hr over 120 Minutes Intravenous Every 12 hours 06/13/17 0336 06/16/17 0747      Subjective: Questioning about going home  Objective: Vitals:   06/16/17 0400 06/16/17 0743 06/16/17 0746 06/16/17 1242  BP: 121/79   (!) 124/92  Pulse: 84 76  85  Resp: 20 18  19   Temp: 98.5 F (36.9 C)   98 F (36.7 C)  TempSrc: Oral   Oral  SpO2: 96% 96% 96% 94%  Weight: 72.3 kg (159 lb 6.4 oz)     Height:        Intake/Output Summary (Last 24 hours) at 06/16/2017 1512 Last data filed at 06/16/2017 1030 Gross per 24 hour  Intake 1200 ml  Output 4225 ml  Net -3025 ml  Filed Weights   06/14/17 0405 06/15/17 0433 06/16/17 0400  Weight: 71.5 kg (157 lb 10.1 oz) 72.5 kg (159 lb 14.4 oz) 72.3 kg (159 lb 6.4 oz)    Examination: General exam: Awake, laying in bed, in nad Respiratory system: Normal respiratory effort, no wheezing Cardiovascular system: regular rate, s1, s2 Gastrointestinal system: Soft, nondistended, positive BS Central nervous system: CN2-12 grossly intact, strength intact Extremities: Perfused, no clubbing Skin: Normal skin turgor, no notable skin lesions seen Psychiatry: Mood normal // no visual  hallucinations   Data Reviewed: I have personally reviewed following labs and imaging studies  CBC: Recent Labs  Lab 06/12/17 2318 06/13/17 0344  WBC 9.2 7.7  HGB 12.3* 12.2*  HCT 37.7* 37.6*  MCV 95.2 94.0  PLT 212 518   Basic Metabolic Panel: Recent Labs  Lab 06/12/17 2318 06/13/17 0344 06/13/17 1313 06/14/17 0324 06/15/17 0455  NA 137 136 136 138 135  K 4.7 4.6 4.6 4.3 4.4  CL 95* 94* 93* 98* 95*  CO2 36* 34* 33* 34* 33*  GLUCOSE 219* 323* 372* 228* 232*  BUN 29* 26* 25* 23* 20  CREATININE 0.94 0.86 0.90 0.64 0.70  CALCIUM 8.6* 8.6* 8.6* 8.4* 8.4*   GFR: Estimated Creatinine Clearance: 97.9 mL/min (by C-G formula based on SCr of 0.7 mg/dL). Liver Function Tests: Recent Labs  Lab 06/12/17 2318 06/13/17 1313  AST 21 17  ALT 28 28  ALKPHOS 67 70  BILITOT 0.7 0.7  PROT 5.3* 5.5*  ALBUMIN 3.6 3.5   Recent Labs  Lab 06/12/17 2318  LIPASE 40   No results for input(s): AMMONIA in the last 168 hours. Coagulation Profile: No results for input(s): INR, PROTIME in the last 168 hours. Cardiac Enzymes: Recent Labs  Lab 06/13/17 1003  TROPONINI <0.03   BNP (last 3 results) No results for input(s): PROBNP in the last 8760 hours. HbA1C: No results for input(s): HGBA1C in the last 72 hours. CBG: Recent Labs  Lab 06/15/17 1239 06/15/17 1710 06/15/17 2102 06/16/17 0741 06/16/17 1241  GLUCAP 254* 224* 84 173* 259*   Lipid Profile: No results for input(s): CHOL, HDL, LDLCALC, TRIG, CHOLHDL, LDLDIRECT in the last 72 hours. Thyroid Function Tests: No results for input(s): TSH, T4TOTAL, FREET4, T3FREE, THYROIDAB in the last 72 hours. Anemia Panel: No results for input(s): VITAMINB12, FOLATE, FERRITIN, TIBC, IRON, RETICCTPCT in the last 72 hours. Sepsis Labs: No results for input(s): PROCALCITON, LATICACIDVEN in the last 168 hours.  Recent Results (from the past 240 hour(s))  Respiratory Panel by PCR     Status: None   Collection Time: 06/08/17  5:00 AM    Result Value Ref Range Status   Adenovirus NOT DETECTED NOT DETECTED Final   Coronavirus 229E NOT DETECTED NOT DETECTED Final   Coronavirus HKU1 NOT DETECTED NOT DETECTED Final   Coronavirus NL63 NOT DETECTED NOT DETECTED Final   Coronavirus OC43 NOT DETECTED NOT DETECTED Final   Metapneumovirus NOT DETECTED NOT DETECTED Final   Rhinovirus / Enterovirus NOT DETECTED NOT DETECTED Final   Influenza A NOT DETECTED NOT DETECTED Final   Influenza B NOT DETECTED NOT DETECTED Final   Parainfluenza Virus 1 NOT DETECTED NOT DETECTED Final   Parainfluenza Virus 2 NOT DETECTED NOT DETECTED Final   Parainfluenza Virus 3 NOT DETECTED NOT DETECTED Final   Parainfluenza Virus 4 NOT DETECTED NOT DETECTED Final   Respiratory Syncytial Virus NOT DETECTED NOT DETECTED Final   Bordetella pertussis NOT DETECTED NOT DETECTED Final   Chlamydophila pneumoniae  NOT DETECTED NOT DETECTED Final   Mycoplasma pneumoniae NOT DETECTED NOT DETECTED Final    Comment: Performed at [facility], [place] [address], [place], [place] [address]  Culture, blood (routine x 2) Call MD if unable to obtain prior to antibiotics being given     Status: None   Collection Time: 06/08/17  6:28 AM  Result Value Ref Range Status   Specimen Description BLOOD SITE NOT SPECIFIED  Final   Special Requests   Final    BOTTLES DRAWN AEROBIC ONLY Blood Culture adequate volume   Culture   Final    NO GROWTH 5 DAYS Performed at [facility], [place] [address] [place], [place] [address]    Report Status 06/13/2017 FINAL  Final  Culture, blood (routine x 2) Call MD if unable to obtain prior to antibiotics being given     Status: None   Collection Time: 06/08/17  6:31 AM  Result Value Ref Range Status   Specimen Description BLOOD SITE NOT SPECIFIED  Final   Special Requests   Final    BOTTLES DRAWN AEROBIC ONLY Blood Culture adequate volume   Culture   Final    NO GROWTH 5 DAYS Performed at [facility], [place] [address]  [place], [place] [address]    Report Status 06/13/2017 FINAL  Final  MRSA PCR Screening     Status: None   Collection Time: 06/13/17  1:05 PM  Result Value Ref Range Status   MRSA by PCR NEGATIVE NEGATIVE Final    Comment:        The GeneXpert MRSA Assay (FDA approved for NASAL specimens only), is one component of a comprehensive MRSA colonization surveillance program. It is not intended to diagnose MRSA infection nor to guide or monitor treatment for MRSA infections. Performed at [facility], [place] [address], [place], [place] [address]      Radiology Studies: No results found.  Scheduled Meds: . ARIPiprazole  5 mg Oral Daily  . budesonide (PULMICORT) nebulizer solution  0.25 mg Nebulization BID  . doxycycline  100 mg Oral Q12H  . enoxaparin (LOVENOX) injection  40 mg Subcutaneous Q24H  . escitalopram  20 mg Oral Daily  . feeding supplement (ENSURE ENLIVE)  237 mL Oral BID BM  . insulin aspart  0-20 Units Subcutaneous TID WC  . insulin aspart  0-5 Units Subcutaneous QHS  . insulin aspart  10 Units Subcutaneous TID WC  . insulin glargine  24 Units Subcutaneous Daily  . ipratropium-albuterol  3 mL Nebulization BID  . methylPREDNISolone (SOLU-MEDROL) injection  40 mg Intravenous Daily  . pravastatin  20 mg Oral q1800  . tamsulosin  0.4 mg Oral Daily   Continuous Infusions:    LOS: 3 days   Marylu Lund, MD [facility] Pager [phone]  If 7PM-7AM, please contact night-coverage [email or web address] Password [email or web address] 06/16/2017, 3:12 PM

## 2017-06-16 NOTE — Progress Notes (Signed)
PHARMACIST - PHYSICIAN COMMUNICATION  CONCERNING: Antibiotic IV to Oral Route Change Policy  RECOMMENDATION: This patient is receiving doxycycline by the intravenous route.  Based on criteria approved by the Pharmacy and Therapeutics Committee, the antibiotic(s) is/are being converted to the equivalent oral dose form(s).  DESCRIPTION: These criteria include:  Patient being treated for a respiratory tract infection, urinary tract infection, cellulitis or clostridium difficile associated diarrhea if on metronidazole  The patient is not neutropenic and does not exhibit a GI malabsorption state  The patient is eating (either orally or via tube) and/or has been taking other orally administered medications for a least 24 hours  The patient is improving clinically and has a Tmax < 100.5  If you have questions about this conversion, please contact the Pharmacy Department  []   [phone] )  [facility] Na [x]   [phone] )  [facility]ac[facility] Pontes  []   [phone] )  [facility] []   [phone] )  [place]. Gerarda Fraction, PharmD PGY1 Pharmacy Resident Pager: [phone] 06/16/17   7:44 AM

## 2017-06-16 NOTE — Evaluation (Signed)
Physical Therapy Evaluation Patient Details Name: Taylor [name] MRN: [id number] DOB: 1955-02-22 Today's Date: 06/16/2017   History of Present Illness  Pt is a 62 y/o male admitted secondary to COPD exacerbation. PMH includes bipolar disorder, DM, colon cancer, prostate cancer, and COPD on 3L of oxygen.   Clinical Impression  Pt presenting with problem above and deficits below. Pt overall steady during ambulation, however, limited secondary to SOB. Pt sats at 89% on 3L during ambulation, however, increased to 90% on 3L with seated rest. Educated about energy conservation techniques. Will continue to follow acutely to maximize functional mobility independence and safety.     Follow Up Recommendations No PT follow up    Equipment Recommendations  Other (comment)(shower chair )    Recommendations for Other Services       Precautions / Restrictions Precautions Precautions: Other (comment) Precaution Comments: Watch O2 sats  Restrictions Weight Bearing Restrictions: No      Mobility  Bed Mobility Overal bed mobility: Modified Independent                Transfers Overall transfer level: Needs assistance Equipment used: None Transfers: Sit to/from Stand Sit to Stand: Supervision         General transfer comment: Increased time, however, no physical assist required. Supervision for safety.   Ambulation/Gait Ambulation/Gait assistance: Supervision Ambulation Distance (Feet): 125 Feet Assistive device: None Gait Pattern/deviations: Step-through pattern;Decreased stride length Gait velocity: Decreased   General Gait Details: Slow, steady ambulation. Distance limited by SOB. Oxygen sats decreased to 89% on 3L during ambulation, however, increased to 90% on 3L with seated rest.   Stairs            Wheelchair Mobility    Modified Rankin (Stroke Patients Only)       Balance Overall balance assessment: Needs assistance Sitting-balance support: Feet  supported Sitting balance-Leahy Scale: Normal     Standing balance support: During functional activity;No upper extremity supported Standing balance-Leahy Scale: Good                               Pertinent Vitals/Pain Pain Assessment: No/denies pain    Home Living Family/patient expects to be discharged to:: Group home Living Arrangements: Group Home               Additional Comments: ramped entrance, no stairs inside, tub shower     Prior Function Level of Independence: Independent         Comments: 3L home O2. Limited to short ambulation distance secondary to SOB     Hand Dominance        Extremity/Trunk Assessment   Upper Extremity Assessment Upper Extremity Assessment: Overall WFL for tasks assessed    Lower Extremity Assessment Lower Extremity Assessment: Overall WFL for tasks assessed    Cervical / Trunk Assessment Cervical / Trunk Assessment: Normal  Communication   Communication: No difficulties  Cognition Arousal/Alertness: Awake/alert Behavior During Therapy: Flat affect Overall Cognitive Status: Within Functional Limits for tasks assessed                                        General Comments General comments (skin integrity, edema, etc.): Educated about general energy conservation techniques.     Exercises     Assessment/Plan    PT Assessment Patient needs continued PT services  PT  Problem List Decreased activity tolerance;Decreased mobility;Cardiopulmonary status limiting activity       PT Treatment Interventions Gait training;Functional mobility training;Therapeutic activities;Therapeutic exercise;Patient/family education    PT Goals (Current goals can be found in the Care Plan section)  Acute Rehab PT Goals Patient Stated Goal: to go home  PT Goal Formulation: With patient Time For Goal Achievement: 06/30/17 Potential to Achieve Goals: Good    Frequency Min 3X/week   Barriers to discharge         Co-evaluation               AM-PAC PT "6 Clicks" Daily Activity  Outcome Measure Difficulty turning over in bed (including adjusting bedclothes, sheets and blankets)?: None Difficulty moving from lying on back to sitting on the side of the bed? : None Difficulty sitting down on and standing up from a chair with arms (e.g., wheelchair, bedside commode, etc,.)?: None Help needed moving to and from a bed to chair (including a wheelchair)?: None Help needed walking in hospital room?: None Help needed climbing 3-5 steps with a railing? : A Little 6 Click Score: 23    End of Session Equipment Utilized During Treatment: Oxygen Activity Tolerance: Treatment limited secondary to medical complications (Comment)(SOB ) Patient left: in chair;with call bell/phone within reach;with family/visitor present Nurse Communication: Mobility status PT Visit Diagnosis: Difficulty in walking, not elsewhere classified (R26.2)    Time: 9702-6378 PT Time Calculation (min) (ACUTE ONLY): 15 min   Charges:   PT Evaluation $PT Eval Low Complexity: 1 Low     PT G Codes:        [name], PT, DPT  Acute Rehabilitation Services  Pager: [phone]   [name] 06/16/2017, 1:57 PM

## 2017-06-17 LAB — GLUCOSE, CAPILLARY
GLUCOSE-CAPILLARY: 112 mg/dL — AB (ref 65–99)
Glucose-Capillary: 54 mg/dL — ABNORMAL LOW (ref 65–99)
Glucose-Capillary: 208 mg/dL — ABNORMAL HIGH (ref 65–99)

## 2017-06-17 MED ORDER — INSULIN GLARGINE 100 UNIT/ML ~~LOC~~ SOLN: 15.0000 [IU] | Freq: Every day | SUBCUTANEOUS | Status: DC

## 2017-06-17 MED ORDER — PREDNISONE 10 MG PO TABS: 10.0000 mg | ORAL_TABLET | Freq: Every day | ORAL | 0 refills | Status: DC

## 2017-06-17 MED ORDER — TAMSULOSIN HCL 0.4 MG PO CAPS: 0.4000 mg | ORAL_CAPSULE | Freq: Every day | ORAL | 0 refills | Status: DC

## 2017-06-17 MED ORDER — INSULIN ASPART 100 UNIT/ML ~~LOC~~ SOLN
8.0000 [IU] | Freq: Three times a day (TID) | SUBCUTANEOUS | Status: DC
  Administered 2017-06-17: 8 [IU] via SUBCUTANEOUS

## 2017-06-17 NOTE — Discharge Summary (Addendum)
Physician Discharge Summary  Taylor [name] OFB:[id number] DOB: 1955/07/09 DOA: 06/12/2017  PCP: [facility]  Admit date: 06/12/2017 Discharge date: 06/17/2017  Admitted From: Home Disposition:  Home  Recommendations for Outpatient Follow-up:  1. Follow up with PCP in 1-2 weeks 2. Recommend establishing with Urology for bladder outlet obstruction   Discharge Condition:Improved CODE STATUS: No intubation Diet recommendation: Diabetic   Brief/Interim Summary: 62 y.o.malewithhistory of COPD, hyperlipidemia, bipolar disorder and previous history of alcohol abuse presents to the ER with complaint of shortness of breath. Patient has been admitted 3 times this month for COPD exacerbation. During the first visit patient had CT angiogram of the chest which was negative for PE. Patient states he did well during the last discharge 4 days ago and started having again shortness of breath over the last 2 days. Denies any chest pain productive cough fever or chills.   1. Acute COPD exacerbation 1. this will be the fourth admission this month.  2. Patient reportedly had been taking prednisone antibiotics and nebulizer.  3. Continued on IV Solu-Medrol nebulizer Pulmicort and doxycycline. 4. Feeling improved. Able to ambulate maintaining O2 sats on baseline 3L. Patient reports feeling well while ambulating 5. Will prescribe course of PO prednisone taper on discharge 2. Urinary hesitancy likely from BPH.  1. Bladder scan demonstrated around 200 cc of urine, patient voiding 2. Will start patient on Flomax 3. Follow serial bladder scan 4. Improved with flomax 5. Recommend follow up with Urology on discharge 3. Hyperlipidemia  1. Continue statin as tolerated. 2. Stable at this time 4. Normocytic normochromic anemia  1. Hemodynamically stable 2. Remains stable 5. Bipolar disorder  1. Continue patient on Abilify and Lexapro. 2. Currently stable 6. Steroid-induced  hyperglycemia  1. last hemoglobin A1c checked in April 29, 2017 was around 6.5. 2. Glucose had been poorly controlled on high dosed IV steroids 3. Glucose now improving with steroid taper 4. Anticipate no needed for insulin on discharge as pt was mildly hypoglycemic early AM of discharge 7. History of alcohol abuse.  1. Patient states he has not had alcohol for long time. 2. 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   albuterol (2.5 MG/3ML) 0.083% nebulizer solution Commonly known as:  PROVENTIL Take 3 mLs (2.5 mg total) by nebulization every 6 (six) hours as needed for wheezing or shortness of breath. Dx: J44.9   ARIPiprazole 5 MG tablet Commonly known as:  ABILIFY Take 1 tablet (5 mg total) by mouth every morning. What changed:  when to take this   doxycycline 100 MG tablet Commonly known as:  VIBRA-TABS Take 1 tablet (100 mg total) by mouth every 12 (twelve) hours.   escitalopram 20 MG tablet Commonly known as:  LEXAPRO Take 1 tablet (20 mg total) by mouth every morning. What changed:  when to take this   lovastatin 20 MG tablet Commonly  known as:  MEVACOR Take 1 tablet (20 mg total) by mouth at bedtime.   mometasone-formoterol 100-5 MCG/ACT Aero Commonly known as:  DULERA Inhale 2 puffs into the lungs 2 (two) times daily.   OXYGEN Inhale 3 L into the lungs.   polyethylene glycol packet Commonly known as:  MIRALAX /  GLYCOLAX Take 17 g by mouth daily as needed for mild constipation.   predniSONE 10 MG tablet Commonly known as:  DELTASONE Take 1 tablet (10 mg total) by mouth daily. What changed:  See the new instructions.   SPIRIVA HANDIHALER 18 MCG inhalation capsule Generic drug:  tiotropium Place 18 mcg into inhaler and inhale daily.   tamsulosin 0.4 MG Caps capsule Commonly known as:  FLOMAX Take 1 capsule (0.4 mg total) by mouth daily. Start taking on:  06/18/2017      Follow-up [facility]. Schedule an appointment as soon as possible for a visit in 1 week(s).   Contact information: [place] [address] [phone]        [facility]. Schedule an appointment as soon as possible for a visit.   Contact information: [place] [phone]         Allergies  Allergen Reactions  . Codeine Nausea And Vomiting    Procedures/Studies: Dg Chest 2 View  Result Date: 06/13/2017 CLINICAL DATA:  Shortness of breath for 5 years.  History of COPD. EXAM: CHEST - 2 VIEW COMPARISON:  Chest radiograph Jun 07, 2017 FINDINGS: Cardiomediastinal silhouette is normal. Increased lung volumes with flattened hemidiaphragms. No pleural effusions or focal consolidations. Trachea projects midline and there is no pneumothorax. Soft tissue planes and included osseous structures are non-suspicious. Old RIGHT rib fractures. IMPRESSION: COPD, no acute cardiopulmonary process. Electronically Signed   By: Elon Alas M.D.   On: 06/13/2017 00:42   Dg Chest 2 View  Result Date: 06/07/2017 CLINICAL DATA:  Acute onset of shortness of breath and hypoxia. EXAM: CHEST - 2 VIEW COMPARISON:  Chest radiograph performed 06/06/2017 FINDINGS: The lungs are hyperexpanded, with flattening of the hemidiaphragms, likely reflecting COPD. There is no evidence of focal opacification, pleural effusion or  pneumothorax. The heart is normal in size; the mediastinal contour is within normal limits. No acute osseous abnormalities are seen. Chronic right-sided rib deformities are noted. IMPRESSION: Findings of COPD; no acute cardiopulmonary process seen. Electronically Signed   By: Garald Balding M.D.   On: 06/07/2017 23:29   Dg Chest 2 View  Result Date: 06/06/2017 CLINICAL DATA:  62 y/o M; 2 days of shortness of breath. History of COPD. EXAM: CHEST - 2 VIEW COMPARISON:  05/28/2017 chest radiograph. FINDINGS: Normal cardiac silhouette. Aortic atherosclerosis with calcification. Hyperinflated lungs and emphysema. No focal consolidation. No pleural effusion or pneumothorax. Multiple chronic right-sided rib fractures are stable. No acute osseous abnormality identified. IMPRESSION: Hyperinflated lungs and emphysema. No acute pulmonary process identified. Electronically Signed   By: Kristine Garbe M.D.   On: 06/06/2017 01:01   Dg Chest 2 View  Result Date: 05/20/2017 CLINICAL DATA:  Shortness of breath EXAM: CHEST - 2 VIEW COMPARISON:  05/13/2017 FINDINGS: Lungs are clear.  No pleural effusion or pneumothorax. The heart is normal in size. Mild degenerative changes of the visualized thoracolumbar spine. IMPRESSION: Normal chest radiographs. Electronically Signed   By: Julian Hy M.D.   On: 05/20/2017 22:44   Ct Head Wo Contrast  Result Date:  05/21/2017 CLINICAL DATA:  Headache and generalize weakness for 2 days. EXAM: CT HEAD WITHOUT CONTRAST TECHNIQUE: Contiguous axial images were obtained from the base of the skull through the vertex without intravenous contrast. COMPARISON:  12/04/2016 FINDINGS: Brain: No evidence of acute infarction, hemorrhage, hydrocephalus, extra-axial collection or mass lesion/mass effect. Vascular: No hyperdense vessel or unexpected calcification. Skull: Normal. Negative for fracture or focal lesion. Sinuses/Orbits: No acute finding. Other: None. IMPRESSION: No acute  intracranial abnormalities. Electronically Signed   By: Lucienne Capers M.D.   On: 05/21/2017 03:03   Ct Angio Chest Pe W Or Wo Contrast  Result Date: 05/21/2017 CLINICAL DATA:  Sternal pain for 2 days.  History of COPD. EXAM: CT ANGIOGRAPHY CHEST WITH CONTRAST TECHNIQUE: Multidetector CT imaging of the chest was performed using the standard protocol during bolus administration of intravenous contrast. Multiplanar CT image reconstructions and MIPs were obtained to evaluate the vascular anatomy. CONTRAST:  132mL ISOVUE-370 IOPAMIDOL (ISOVUE-370) INJECTION 76% COMPARISON:  None. FINDINGS: Cardiovascular: Good opacification of the central and segmental pulmonary arteries. No focal filling defects. No evidence of significant pulmonary embolus. Normal heart size. No pericardial effusion. Normal caliber thoracic aorta. No aortic dissection. Great vessel origins are patent. Coronary artery calcifications. Mediastinum/Nodes: No enlarged mediastinal, hilar, or axillary lymph nodes. Thyroid gland, trachea, and esophagus demonstrate no significant findings. Lungs/Pleura: Emphysematous changes in the lungs. Bronchial wall thickening consistent with chronic bronchitis. No airspace disease or consolidation. No pleural effusions. No pneumothorax. Airways are patent. Upper Abdomen: No acute process demonstrated in the visualized upper abdomen. Musculoskeletal: Old right third rib deformity. Degenerative changes in the spine. No destructive bone lesions. Review of the MIP images confirms the above findings. IMPRESSION: 1. No evidence of significant pulmonary embolus. 2. Emphysematous changes in the lungs. No evidence of active pulmonary disease. 3. Mild coronary artery calcifications. Emphysema (ICD10-J43.9). Electronically Signed   By: Lucienne Capers M.D.   On: 05/21/2017 03:08   Dg Chest Port 1 View  Result Date: 05/28/2017 CLINICAL DATA:  Shortness of breath.  COPD.  Former smoker. EXAM: PORTABLE CHEST 1 VIEW COMPARISON:   05/24/2017. FINDINGS: Normal cardiomediastinal silhouette. Hyperinflation without consolidation or edema. Chronic LEFT CP angle blunting. Mild basilar scarring. No pneumothorax. No acute osseous findings. Chronic rib fractures. IMPRESSION: COPD.  No active disease. Electronically Signed   By: Staci Righter M.D.   On: 05/28/2017 23:14   Dg Chest Port 1 View  Result Date: 05/24/2017 CLINICAL DATA:  Central chest pain and shortness of breath for 3 days. EXAM: PORTABLE CHEST 1 VIEW COMPARISON:  Chest CT 05/21/2017, chest radiograph 05/20/2017 FINDINGS: Cardiomediastinal silhouette is normal. Mediastinal contours appear intact. There is no evidence of focal airspace consolidation, pleural effusion or pneumothorax. Emphysematous changes with hyperinflation of the lungs again seen. Osseous structures are without acute abnormality. Soft tissues are grossly normal. IMPRESSION: Emphysematous changes with hyperinflation of the lungs. No evidence of focal consolidation. Electronically Signed   By: Fidela Salisbury M.D.   On: 05/24/2017 17:20     Subjective: Eager to go home  Discharge Exam: Vitals:   06/17/17 0900 06/17/17 1235  BP: (!) 92/54 128/85  Pulse: 64 90  Resp:  18  Temp:  98.7 F (37.1 C)  SpO2: 100% 97%   Vitals:   06/17/17 0451 06/17/17 0741 06/17/17 0900 06/17/17 1235  BP:   (!) 92/54 128/85  Pulse:  83 64 90  Resp:  16  18  Temp:    98.7 F (37.1 C)  TempSrc:  Oral  SpO2:  98% 100% 97%  Weight: 71.8 kg (158 lb 6.4 oz)     Height:        General: Pt is alert, awake, not in acute distress Cardiovascular: RRR, S1/S2 +, no rubs, no gallops Respiratory: CTA bilaterally, no wheezing, no rhonchi Abdominal: Soft, NT, ND, bowel sounds + Extremities: no edema, no cyanosis   The results of significant diagnostics from this hospitalization (including imaging, microbiology, ancillary and laboratory) are listed below for reference.     Microbiology: Recent Results (from the past  240 hour(s))  Respiratory Panel by PCR     Status: None   Collection Time: 06/08/17  5:00 AM  Result Value Ref Range Status   Adenovirus NOT DETECTED NOT DETECTED Final   Coronavirus 229E NOT DETECTED NOT DETECTED Final   Coronavirus HKU1 NOT DETECTED NOT DETECTED Final   Coronavirus NL63 NOT DETECTED NOT DETECTED Final   Coronavirus OC43 NOT DETECTED NOT DETECTED Final   Metapneumovirus NOT DETECTED NOT DETECTED Final   Rhinovirus / Enterovirus NOT DETECTED NOT DETECTED Final   Influenza A NOT DETECTED NOT DETECTED Final   Influenza B NOT DETECTED NOT DETECTED Final   Parainfluenza Virus 1 NOT DETECTED NOT DETECTED Final   Parainfluenza Virus 2 NOT DETECTED NOT DETECTED Final   Parainfluenza Virus 3 NOT DETECTED NOT DETECTED Final   Parainfluenza Virus 4 NOT DETECTED NOT DETECTED Final   Respiratory Syncytial Virus NOT DETECTED NOT DETECTED Final   Bordetella pertussis NOT DETECTED NOT DETECTED Final   Chlamydophila pneumoniae NOT DETECTED NOT DETECTED Final   Mycoplasma pneumoniae NOT DETECTED NOT DETECTED Final    Comment: Performed at [facility], [address], [place], [place] [address]  Culture, blood (routine x 2) Call MD if unable to obtain prior to antibiotics being given     Status: None   Collection Time: 06/08/17  6:28 AM  Result Value Ref Range Status   Specimen Description BLOOD SITE NOT SPECIFIED  Final   Special Requests   Final    BOTTLES DRAWN AEROBIC ONLY Blood Culture adequate volume   Culture   Final    NO GROWTH 5 DAYS Performed at [facility], [place] [address], [place], [place] [address]    Report Status 06/13/2017 FINAL  Final  Culture, blood (routine x 2) Call MD if unable to obtain prior to antibiotics being given     Status: None   Collection Time: 06/08/17  6:31 AM  Result Value Ref Range Status   Specimen Description BLOOD SITE NOT SPECIFIED  Final   Special Requests   Final    BOTTLES DRAWN AEROBIC ONLY Blood Culture adequate volume    Culture   Final    NO GROWTH 5 DAYS Performed at [facility], [place] [address], [place], [place] [address]    Report Status 06/13/2017 FINAL  Final  MRSA PCR Screening     Status: None   Collection Time: 06/13/17  1:05 PM  Result Value Ref Range Status   MRSA by PCR NEGATIVE NEGATIVE Final    Comment:        The GeneXpert MRSA Assay (FDA approved for NASAL specimens only), is one component of a comprehensive MRSA colonization surveillance program. It is not intended to diagnose MRSA infection nor to guide or monitor treatment for MRSA infections. Performed at [facility], [place] [address] [place], [place] [address]      Labs: BNP (last 3 results)  Recent Labs    05/23/17 1052 05/28/17 2217 06/13/17 1003  BNP 93.4 28.9 16.1   Basic Metabolic Panel: Recent Labs  Lab 06/12/17 2318 06/13/17 0344 06/13/17 1313 06/14/17 0324 06/15/17 0455  NA 137 136 136 138 135  K 4.7 4.6 4.6 4.3 4.4  CL 95* 94* 93* 98* 95*  CO2 36* 34* 33* 34* 33*  GLUCOSE 219* 323* 372* 228* 232*  BUN 29* 26* 25* 23* 20  CREATININE 0.94 0.86 0.90 0.64 0.70  CALCIUM 8.6* 8.6* 8.6* 8.4* 8.4*   Liver Function Tests: Recent Labs  Lab 06/12/17 2318 06/13/17 1313  AST 21 17  ALT 28 28  ALKPHOS 67 70  BILITOT 0.7 0.7  PROT 5.3* 5.5*  ALBUMIN 3.6 3.5   Recent Labs  Lab 06/12/17 2318  LIPASE 40   No results for input(s): AMMONIA in the last 168 hours. CBC: Recent Labs  Lab 06/12/17 2318 06/13/17 0344  WBC 9.2 7.7  HGB 12.3* 12.2*  HCT 37.7* 37.6*  MCV 95.2 94.0  PLT 212 192   Cardiac Enzymes: Recent Labs  Lab 06/13/17 1003  TROPONINI <0.03   BNP: Invalid input(s): POCBNP CBG: Recent Labs  Lab 06/16/17 1241 06/16/17 1717 06/16/17 2116 06/17/17 0820 06/17/17 1229  GLUCAP 259* 84 368* 54* 208*   D-Dimer No results for input(s): DDIMER in the last 72 hours. Hgb A1c No results for input(s): HGBA1C in the last 72 hours. Lipid Profile No results for input(s):  CHOL, HDL, LDLCALC, TRIG, CHOLHDL, LDLDIRECT in the last 72 hours. Thyroid function studies No results for input(s): TSH, T4TOTAL, T3FREE, THYROIDAB in the last 72 hours.  Invalid input(s): FREET3 Anemia work up No results for input(s): VITAMINB12, FOLATE, FERRITIN, TIBC, IRON, RETICCTPCT in the last 72 hours. Urinalysis    Component Value Date/Time   COLORURINE YELLOW 06/13/2017 0203   APPEARANCEUR CLEAR 06/13/2017 0203   LABSPEC 1.018 06/13/2017 0203   PHURINE 6.0 06/13/2017 0203   GLUCOSEU 50 (A) 06/13/2017 0203   HGBUR NEGATIVE 06/13/2017 0203   BILIRUBINUR NEGATIVE 06/13/2017 0203   KETONESUR NEGATIVE 06/13/2017 0203   PROTEINUR NEGATIVE 06/13/2017 0203   UROBILINOGEN 1.0 01/09/2013 1843   NITRITE NEGATIVE 06/13/2017 0203   LEUKOCYTESUR NEGATIVE 06/13/2017 0203   Sepsis Labs Invalid input(s): PROCALCITONIN,  WBC,  LACTICIDVEN Microbiology Recent Results (from the past 240 hour(s))  Respiratory Panel by PCR     Status: None   Collection Time: 06/08/17  5:00 AM  Result Value Ref Range Status   Adenovirus NOT DETECTED NOT DETECTED Final   Coronavirus 229E NOT DETECTED NOT DETECTED Final   Coronavirus HKU1 NOT DETECTED NOT DETECTED Final   Coronavirus NL63 NOT DETECTED NOT DETECTED Final   Coronavirus OC43 NOT DETECTED NOT DETECTED Final   Metapneumovirus NOT DETECTED NOT DETECTED Final   Rhinovirus / Enterovirus NOT DETECTED NOT DETECTED Final   Influenza A NOT DETECTED NOT DETECTED Final   Influenza B NOT DETECTED NOT DETECTED Final   Parainfluenza Virus 1 NOT DETECTED NOT DETECTED Final   Parainfluenza Virus 2 NOT DETECTED NOT DETECTED Final   Parainfluenza Virus 3 NOT DETECTED NOT DETECTED Final   Parainfluenza Virus 4 NOT DETECTED NOT DETECTED Final   Respiratory Syncytial Virus NOT DETECTED NOT DETECTED Final   Bordetella pertussis NOT DETECTED NOT DETECTED Final   Chlamydophila pneumoniae NOT DETECTED NOT DETECTED Final   Mycoplasma pneumoniae NOT DETECTED NOT  DETECTED Final    Comment: Performed at [facility], [place] [address], [place], [place]  [address]  Culture, blood (routine x 2) Call MD if unable to obtain prior to antibiotics being given     Status: None   Collection Time: 06/08/17  6:28 AM  Result Value Ref Range Status   Specimen Description BLOOD SITE NOT SPECIFIED  Final   Special Requests   Final    BOTTLES DRAWN AEROBIC ONLY Blood Culture adequate volume   Culture   Final    NO GROWTH 5 DAYS Performed at [facility], [place] [address] [place], [place] [address]    Report Status 06/13/2017 FINAL  Final  Culture, blood (routine x 2) Call MD if unable to obtain prior to antibiotics being given     Status: None   Collection Time: 06/08/17  6:31 AM  Result Value Ref Range Status   Specimen Description BLOOD SITE NOT SPECIFIED  Final   Special Requests   Final    BOTTLES DRAWN AEROBIC ONLY Blood Culture adequate volume   Culture   Final    NO GROWTH 5 DAYS Performed at [facility], [place] [address], [place], [place] [address]    Report Status 06/13/2017 FINAL  Final  MRSA PCR Screening     Status: None   Collection Time: 06/13/17  1:05 PM  Result Value Ref Range Status   MRSA by PCR NEGATIVE NEGATIVE Final    Comment:        The GeneXpert MRSA Assay (FDA approved for NASAL specimens only), is one component of a comprehensive MRSA colonization surveillance program. It is not intended to diagnose MRSA infection nor to guide or monitor treatment for MRSA infections. Performed at [facility], [place] [address] [place],  [address]     Time spent: 85min  SIGNED:   Marylu Lund, MD  [facility] 06/17/2017, 3:32 PM  If 7PM-7AM, please contact night-coverage [email or web address] Password [email or web address]

## 2017-06-17 NOTE — Progress Notes (Signed)
Patient states he doesn't have transportation to get to group home and needs assistance with oxygen. Patient does not know phone number to group home. Social work notified.

## 2017-06-17 NOTE — Progress Notes (Signed)
ED CSW received handoff for about getting home back to [facility]. CSW received update from pt's floor, discharge postponed until tomorrow, so pt's oxygen can be delivered.   Wendelyn Breslow, [facility]  [phone]

## 2017-06-17 NOTE — Progress Notes (Signed)
Pt states he has no ride home, and usually has [facility] transport him home from hospital.  Arranged [facility] transport, per his request.  [facility] called at 1730 for transport.    Reinaldo Raddle, RN, BSN  Trauma/Neuro ICU Case Manager [phone]

## 2017-06-17 NOTE — Progress Notes (Signed)
Patient discharged via [facility]. All belongings returned to patient. Discharge instructions and packet reviewed with patient. Patient verbalized understanding. VSS.

## 2017-06-17 NOTE — Progress Notes (Signed)
Pt ambulated 1/2 hallway, stopped x1 for few mintues, 02 3l/[place] 95% on3l at rest, down to 91 at rest, hr up to 140's with walking, after 3 min rest 97% on 3l/[place] and hr 115, pt verbalized feeling better,will page dr Wyline Copas with update

## 2017-06-23 ENCOUNTER — Emergency Department (HOSPITAL_COMMUNITY)
Admission: EM | Admit: 2017-06-23 | Discharge: 2017-06-24 | Disposition: A | Discharge status: Home / Self Care | Payer: Medicaid Other | Attending: Emergency Medicine | Admitting: Emergency Medicine

## 2017-06-23 ENCOUNTER — Encounter (HOSPITAL_COMMUNITY): Payer: Self-pay | Admitting: Emergency Medicine

## 2017-06-23 ENCOUNTER — Emergency Department (HOSPITAL_COMMUNITY): Payer: Medicaid Other

## 2017-06-23 DIAGNOSIS — E785 Hyperlipidemia, unspecified: Secondary | ICD-10-CM | POA: Diagnosis not present

## 2017-06-23 DIAGNOSIS — J441 Chronic obstructive pulmonary disease with (acute) exacerbation: Secondary | ICD-10-CM | POA: Insufficient documentation

## 2017-06-23 DIAGNOSIS — Z8546 Personal history of malignant neoplasm of prostate: Secondary | ICD-10-CM | POA: Insufficient documentation

## 2017-06-23 DIAGNOSIS — R0602 Shortness of breath: Secondary | ICD-10-CM | POA: Diagnosis present

## 2017-06-23 DIAGNOSIS — Z79899 Other long term (current) drug therapy: Secondary | ICD-10-CM | POA: Insufficient documentation

## 2017-06-23 DIAGNOSIS — Z87891 Personal history of nicotine dependence: Secondary | ICD-10-CM | POA: Insufficient documentation

## 2017-06-23 LAB — BASIC METABOLIC PANEL
ANION GAP: 7 (ref 5–15)
BUN: 23 mg/dL — AB (ref 6–20)
CHLORIDE: 94 mmol/L — AB (ref 101–111)
CO2: 38 mmol/L — AB (ref 22–32)
Calcium: 9.1 mg/dL (ref 8.9–10.3)
Creatinine, Ser: 0.74 mg/dL (ref 0.61–1.24)
GFR calc Af Amer: >60 mL/min (ref >60)
GFR calc non Af Amer: >60 mL/min (ref >60)
GLUCOSE: 343 mg/dL — AB (ref 65–99)
Potassium: 4.8 mmol/L (ref 3.5–5.1)
Sodium: 139 mmol/L (ref 135–145)

## 2017-06-23 LAB — I-STAT TROPONIN, ED: Troponin i, poc: 0.01 ng/mL (ref 0.00–0.08)

## 2017-06-23 LAB — CBG MONITORING, ED: Glucose-Capillary: 319 mg/dL — ABNORMAL HIGH (ref 65–99)

## 2017-06-23 LAB — CBC
HEMATOCRIT: 42.7 % (ref 39.0–52.0)
HEMOGLOBIN: 13.6 g/dL (ref 13.0–17.0)
MCH: 30.4 pg (ref 26.0–34.0)
MCHC: 31.9 g/dL (ref 30.0–36.0)
MCV: 95.3 fL (ref 78.0–100.0)
Platelets: 228 10*3/uL (ref 150–400)
RBC: 4.48 MIL/uL (ref 4.22–5.81)
RDW: 13.5 % (ref 11.5–15.5)
WBC: 10.3 10*3/uL (ref 4.0–10.5)

## 2017-06-23 MED ORDER — IPRATROPIUM-ALBUTEROL 0.5-2.5 (3) MG/3ML IN SOLN
3.0000 mL | RESPIRATORY_TRACT | Status: AC
  Administered 2017-06-23 (×2): 3 mL via RESPIRATORY_TRACT
  Filled 2017-06-23 (×2): qty 3

## 2017-06-23 MED ORDER — SODIUM CHLORIDE 0.9 % IV BOLUS
1000.0000 mL | Freq: Once | INTRAVENOUS | Status: AC
  Administered 2017-06-23: 1000 mL via INTRAVENOUS

## 2017-06-23 NOTE — ED Triage Notes (Signed)
BIB EMS from home reports SOB X3 days. Pt states he has been taking prednisone and has seen no relief. Pt recently DC from hospital 5/28 for COPD exacerbation. Labored breathing noted in triage. Wears 3L [place] at all times.

## 2017-06-23 NOTE — ED Notes (Signed)
[facility] called  

## 2017-06-23 NOTE — ED Provider Notes (Signed)
[facility] EMERGENCY DEPARTMENT Provider Note   CSN: [id number] Arrival date & time: 06/23/17  1910     History   Chief Complaint Chief Complaint  Patient presents with  . Shortness of Breath    HPI Taylor [name] is a 62 y.o. male.  The history is provided by the patient and medical records.   62 y/o M with PMhx of COPD (on 3L [place] baseline), recent d/c from hospital for COPD exacerbation who presents with dyspnea x 2-3 days. Severe, worsening, not relieved with home nebs and steroids. Accompanied by chest tightness with feels similar to other COPD exacerbations. Denies cough. Feels similar to prior COPD exacerbations.   Past Medical History:  Diagnosis Date  . Asthma   . Bipolar 1 disorder ([place]) 02/05/2012  . Colon cancer ([place]) 04/11/11   adenocarcinoma of colon, 7/19 nodes pos.FINISHED CHEMO/DR. SHERRILL  . COPD (chronic obstructive pulmonary disease) ([place])    SMOKER  . Depression   . Emphysema of lung ([place])   . Full dentures   . Hemorrhoids   . On home oxygen therapy    "3L; 24/7" (05/29/2017)  . Pneumonia ~ 2016   "double pneumonia"  . Prostate cancer ([place])    Gleason score = 7, supposed to have radiation therapy but he has not followed up (05/29/2017)  . Rib fractures    hx of    Patient Active Problem List   Diagnosis Date Noted  . Malnutrition of moderate degree 06/14/2017  . Hyperglycemia 06/08/2017  . Constipation 06/08/2017  . SIRS (systemic inflammatory response syndrome) ([place]) 05/21/2017  . Tobacco abuse 05/13/2017  . HLD (hyperlipidemia) 05/13/2017  . Leukocytosis 04/06/2017  . Adjustment disorder with depressed mood 03/28/2017  . Adjustment disorder with mixed disturbance of emotions and conduct   . Chronic respiratory failure with hypoxia ([place]) 03/24/2017  . Normocytic normochromic anemia 03/24/2017  . COPD with acute exacerbation ([place]) 10/22/2016  . Alcohol abuse 01/09/2013  . COPD exacerbation ([place]) 01/09/2013  . Chest pain  01/09/2013  . Smoker 12/16/2012  . Inguinal hernia unilateral, non-recurrent, right 05/01/2012  . Dyspepsia 02/05/2012  . Bipolar 1 disorder ([place]) 02/05/2012  . 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 COPD  GOLD III 02/03/2012  . Thrombocytopenia ([place]) 02/03/2012  . Depression   . 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 COLON SURGERY  04/11/11   Sigmoid colectomy  . COLOSTOMY REVISION  04/11/2011   Procedure: COLON RESECTION SIGMOID;  Surgeon: [name], MD;  Location: [facility];  Service: General;  Laterality: N/A;  low anterior colon resection   . HERNIA REPAIR    . INGUINAL HERNIA REPAIR Right 05/01/2012   Procedure: HERNIA REPAIR INGUINAL ADULT;  Surgeon: [name], MD;  Location: [facility];  Service: General;  Laterality: Right;  . INSERTION OF MESH Right 05/01/2012   Procedure: INSERTION OF MESH;  Surgeon: [name], MD;  Location: [facility];  Service: General;  Laterality: Right;  . PORT-A-CATH REMOVAL Left 05/01/2012   Procedure: REMOVAL Infusion Port;  Surgeon: [name], MD;  Location: [facility];  Service: General;  Laterality: Left;  . PORTACATH PLACEMENT  05/02/2011   Procedure: INSERTION PORT-A-CATH;  Surgeon: [name], MD;  Location: [facility];  Service: General;  Laterality: N/A;  . PROSTATE BIOPSY          Home Medications    Prior to Admission medications   Medication Sig  Start Date End Date Taking? Authorizing Provider  albuterol (PROVENTIL) (2.5 MG/3ML) 0.083% nebulizer solution Take 3 mLs (2.5 mg total) by nebulization every 6 (six) hours as needed for wheezing or shortness of breath. Dx: J44.9 06/02/17 07/02/17 Yes Arrien, Jimmy Picket, MD  ARIPiprazole (ABILIFY) 5 MG tablet Take 1 tablet (5 mg total) by mouth every morning. Patient taking differently: Take 5 mg by mouth daily.  05/18/17  Yes Nita Sells, MD  escitalopram (LEXAPRO) 20 MG tablet Take 1 tablet (20 mg total) by mouth  every morning. Patient taking differently: Take 20 mg by mouth daily.  05/18/17  Yes Nita Sells, MD  lovastatin (MEVACOR) 20 MG tablet Take 1 tablet (20 mg total) by mouth at bedtime. 05/18/17  Yes Nita Sells, MD  mometasone-formoterol (DULERA) 100-5 MCG/ACT AERO Inhale 2 puffs into the lungs 2 (two) times daily. 05/18/17  Yes Nita Sells, MD  OXYGEN Inhale 3 L into the lungs.    Yes [provider]  polyethylene glycol (MIRALAX / GLYCOLAX) packet Take 17 g by mouth daily as needed for mild constipation. 06/09/17  Yes Samuella Cota, MD  predniSONE (DELTASONE) 10 MG tablet Take 1 tablet (10 mg total) by mouth daily. 06/17/17  Yes Donne Hazel, MD  tamsulosin (FLOMAX) 0.4 MG CAPS capsule Take 1 capsule (0.4 mg total) by mouth daily. 06/18/17  Yes Donne Hazel, MD  tiotropium (SPIRIVA HANDIHALER) 18 MCG inhalation capsule Place 18 mcg into inhaler and inhale daily.   Yes [provider]  acetaminophen (TYLENOL) 500 MG tablet Take 1 tablet (500 mg total) by mouth every 6 (six) hours as needed (as needed for pain.). 06/02/17   Arrien, Jimmy Picket, MD  doxycycline (VIBRA-TABS) 100 MG tablet Take 1 tablet (100 mg total) by mouth every 12 (twelve) hours. Patient not taking: Reported on 06/23/2017 06/09/17   Samuella Cota, MD    Family History Family History  Problem Relation Age of Onset  . Heart disease Father   . Lung cancer Maternal Uncle        smoked    Social History Social History   Tobacco Use  . Smoking status: Former Smoker    Packs/day: 0.10    Years: 46.00    Pack years: 4.60    Types: Cigarettes    Last attempt to quit: 05/21/2017    Years since quitting: 0.0  . Smokeless tobacco: Former Systems developer    Types: Chew  Substance Use Topics  . Alcohol use: Not Currently    Comment: h/o use in the past, no h/o heavy use  . Drug use: No     Allergies   Codeine   Review of Systems Review of Systems  Constitutional: Negative  for chills and fever.  HENT: Negative for ear pain and sore throat.   Eyes: Negative for pain and visual disturbance.  Respiratory: Positive for chest tightness, shortness of breath and wheezing. Negative for cough.   Cardiovascular: Negative for chest pain and palpitations.  Gastrointestinal: Negative for abdominal pain and vomiting.  Genitourinary: Negative for dysuria and hematuria.  Musculoskeletal: Negative for arthralgias and back pain.  Skin: Negative for color change and rash.  Neurological: Negative for seizures and syncope.  All other systems reviewed and are negative.    Physical Exam Updated Vital Signs BP 121/89   Pulse 89   Temp 99.1 F (37.3 C) (Oral)   Resp (!) 24   Ht 5\' 11"  (1.803 m)   Wt 72.6 kg (160 lb)  SpO2 97%   BMI 22.32 kg/m   Physical Exam  Constitutional: He appears well-developed and well-nourished.  HENT:  Head: Normocephalic and atraumatic.  Eyes: Conjunctivae are normal.  Neck: Neck supple.  Cardiovascular: Regular rhythm. Tachycardia present.  No murmur heard. Pulmonary/Chest: Tachypnea noted. No respiratory distress. He has decreased breath sounds (throughout). He has wheezes (thoughout).  Abdominal: Soft. There is no tenderness.  Musculoskeletal: He exhibits no edema.       Right lower leg: Normal.       Left lower leg: Normal.  Neurological: He is alert.  Skin: Skin is warm and dry.  Psychiatric: He has a normal mood and affect.  Nursing note and vitals reviewed.    ED Treatments / Results  Labs (all labs ordered are listed, but only abnormal results are displayed) Labs Reviewed  BASIC METABOLIC PANEL - Abnormal; Notable for the following components:      Result Value   Chloride 94 (*)    CO2 38 (*)    Glucose, Bld 343 (*)    BUN 23 (*)    All other components within normal limits  CBG MONITORING, ED - Abnormal; Notable for the following components:   Glucose-Capillary 319 (*)    All other components within normal limits    CBC  I-STAT TROPONIN, ED    EKG EKG Interpretation  Date/Time:  Monday June 23 2017 19:17:05 EDT Ventricular Rate:  126 PR Interval:    QRS Duration: 83 QT Interval:  283 QTC Calculation: 374 R Axis:   -52 Text Interpretation:  Sinus tachycardia Paired ventricular premature complexes Probable left atrial enlargement Left anterior fascicular block Anteroseptal infarct, age indeterminate ST elevation, consider inferior injury Lateral leads are also involved Baseline wander in lead(s) I II aVR Interpretation limited secondary to artifact Confirmed by Theotis Burrow [phone]) on 06/23/2017 8:44:22 PM   Radiology Dg Chest Portable 1 View  Result Date: 06/23/2017 CLINICAL DATA:  Shortness of breath, worse over the past day and a half. EXAM: PORTABLE CHEST 1 VIEW COMPARISON:  Radiographs 06/13/2017, most recent chest CT 05/21/2017 FINDINGS: Lungs are hyperinflated with moderate to severe emphysema. No focal airspace disease, pulmonary edema, pneumothorax or pleural effusion. Unchanged heart size and mediastinal contours. Remote right rib fractures. IMPRESSION: Hyperinflation and emphysema consistent with COPD. No acute findings. Electronically Signed   By: Jeb Levering M.D.   On: 06/23/2017 20:15    Procedures Procedures (including critical care time)  Medications Ordered in ED Medications  ipratropium-albuterol (DUONEB) 0.5-2.5 (3) MG/3ML nebulizer solution 3 mL (3 mLs Nebulization Given 06/23/17 2015)  sodium chloride 0.9 % bolus 1,000 mL (0 mLs Intravenous Stopped 06/23/17 2052)     Initial Impression / Assessment and Plan / ED Course  I have reviewed the triage vital signs and the nursing notes.  Pertinent labs & imaging results that were available during my care of the patient were reviewed by me and considered in my medical decision making (see chart for details).     Taylor [name] is a 62 y.o. male with PMHx of COPD (on 3L [place] baseline) who p/w dyspnea c/w past COPD  excerbations. Reviewed and confirmed nursing documentation for past medical history, family history, social history. VS afebrile, HR 120s, 98% on 3L. Exam remarkable for tachypnea, decreased breath sounds with wheezes throughout.. Ddx likely COPD exacerbation. Denies infectious symptoms since d/c from hospital. Likely chest tightness 2/2 COPD, states similar to prior. PE w/us in past negative.    EKG with sinus tachycardia  with PVCs, rate 126, nl axis, nl intervals, no STE/D. CXR with hyperinflation, emphysema c/w COPD, no acute findings. BMP with hyperglycemia 343, nl AG. Trop negative.    Given 1L NS and 3 back to back duo-nebs here with improvement. On reexam, pt moving much more air, still with wheezes throughout, but no longer tachypneic, satting well on home 3L. Pt states he feels better, requesting to go home. In light of his h/o hyperglycemia 2/2 steroid use (and currently on steroid taper per hospital d/c plan), do not feel that additional steroids would be beneficial at this time. Advised that he stick to a strict q4 albuterol regimen, f/u with pulmonology in the AM.   Old records reviewed. Labs reviewed by me and used in the medical decision making.  Imaging viewed and interpreted by me and used in the medical decision making (formal interpretation from radiologist). EKG reviewed by me and used in the medical decision making. D/c home in stable condition, return precautions discussed. Patient agreeable with plan for d/c home.   Final Clinical Impressions(s) / ED Diagnoses   Final diagnoses:  COPD exacerbation [facility])    ED Discharge Orders    None       Norm Salt, MD 06/23/17 2325    Rex Kras, Wenda Overland, MD 06/26/17 1458    Rex Kras, Wenda [place], MD 06/26/17 1500

## 2017-06-23 NOTE — Discharge Instructions (Signed)
Please use your albuterol nebulizer treatments every 4 hours for the next 3 days.

## 2017-06-26 ENCOUNTER — Encounter (HOSPITAL_COMMUNITY): Payer: Self-pay | Admitting: *Deleted

## 2017-06-26 ENCOUNTER — Emergency Department (HOSPITAL_COMMUNITY)
Admission: EM | Admit: 2017-06-26 | Discharge: 2017-06-26 | Disposition: A | Discharge status: Home / Self Care | Payer: Medicaid Other | Attending: Emergency Medicine | Admitting: Emergency Medicine

## 2017-06-26 ENCOUNTER — Emergency Department (HOSPITAL_COMMUNITY): Payer: Medicaid Other

## 2017-06-26 ENCOUNTER — Other Ambulatory Visit: Payer: Self-pay

## 2017-06-26 DIAGNOSIS — R0602 Shortness of breath: Secondary | ICD-10-CM | POA: Diagnosis present

## 2017-06-26 DIAGNOSIS — Z87891 Personal history of nicotine dependence: Secondary | ICD-10-CM | POA: Insufficient documentation

## 2017-06-26 DIAGNOSIS — J441 Chronic obstructive pulmonary disease with (acute) exacerbation: Secondary | ICD-10-CM | POA: Insufficient documentation

## 2017-06-26 DIAGNOSIS — Z79899 Other long term (current) drug therapy: Secondary | ICD-10-CM | POA: Diagnosis not present

## 2017-06-26 LAB — CBC WITH DIFFERENTIAL/PLATELET
BASOS ABS: 0 10*3/uL (ref 0.0–0.1)
BASOS PCT: 0 %
Eosinophils Absolute: 0.1 10*3/uL (ref 0.0–0.7)
Eosinophils Relative: 1 %
HEMATOCRIT: 39.2 % (ref 39.0–52.0)
Hemoglobin: 12.5 g/dL — ABNORMAL LOW (ref 13.0–17.0)
Lymphocytes Relative: 18 %
Lymphs Abs: 1.2 10*3/uL (ref 0.7–4.0)
MCH: 31 pg (ref 26.0–34.0)
MCHC: 31.9 g/dL (ref 30.0–36.0)
MCV: 97.3 fL (ref 78.0–100.0)
MONO ABS: 0.6 10*3/uL (ref 0.1–1.0)
Monocytes Relative: 8 %
NEUTROS ABS: 4.8 10*3/uL (ref 1.7–7.7)
Neutrophils Relative %: 73 %
PLATELETS: 171 10*3/uL (ref 150–400)
RBC: 4.03 MIL/uL — ABNORMAL LOW (ref 4.22–5.81)
RDW: 14.1 % (ref 11.5–15.5)
WBC: 6.6 10*3/uL (ref 4.0–10.5)

## 2017-06-26 LAB — BLOOD GAS, VENOUS
Acid-Base Excess: 4.7 mmol/L — ABNORMAL HIGH (ref 0.0–2.0)
Bicarbonate: 33.3 mmol/L — ABNORMAL HIGH (ref 20.0–28.0)
O2 Content: 3 L/min
O2 Saturation: 66.9 %
PH VEN: 7.269 (ref 7.250–7.430)
PO2 VEN: 38.6 mmHg (ref 32.0–45.0)
Patient temperature: 98.6
pCO2, Ven: 75.3 mmHg (ref 44.0–60.0)

## 2017-06-26 LAB — CBG MONITORING, ED: Glucose-Capillary: 234 mg/dL — ABNORMAL HIGH (ref 65–99)

## 2017-06-26 LAB — COMPREHENSIVE METABOLIC PANEL
ALBUMIN: 3.6 g/dL (ref 3.5–5.0)
ALT: 43 U/L (ref 17–63)
AST: 20 U/L (ref 15–41)
Alkaline Phosphatase: 63 U/L (ref 38–126)
Anion gap: 6 (ref 5–15)
BUN: 24 mg/dL — AB (ref 6–20)
CO2: 37 mmol/L — ABNORMAL HIGH (ref 22–32)
Calcium: 8.4 mg/dL — ABNORMAL LOW (ref 8.9–10.3)
Chloride: 99 mmol/L — ABNORMAL LOW (ref 101–111)
Creatinine, Ser: 0.68 mg/dL (ref 0.61–1.24)
GFR calc Af Amer: >60 mL/min (ref >60)
GFR calc non Af Amer: >60 mL/min (ref >60)
GLUCOSE: 239 mg/dL — AB (ref 65–99)
POTASSIUM: 4.6 mmol/L (ref 3.5–5.1)
Sodium: 142 mmol/L (ref 135–145)
Total Bilirubin: 0.4 mg/dL (ref 0.3–1.2)
Total Protein: 5.8 g/dL — ABNORMAL LOW (ref 6.5–8.1)

## 2017-06-26 LAB — I-STAT TROPONIN, ED: Troponin i, poc: 0 ng/mL (ref 0.00–0.08)

## 2017-06-26 MED ORDER — ESCITALOPRAM OXALATE 20 MG PO TABS: 20.0000 mg | ORAL_TABLET | ORAL | 0 refills | Status: DC

## 2017-06-26 MED ORDER — IOPAMIDOL (ISOVUE-370) INJECTION 76%
100.0000 mL | Freq: Once | INTRAVENOUS | Status: AC | PRN
  Administered 2017-06-26: 100 mL via INTRAVENOUS

## 2017-06-26 MED ORDER — SODIUM CHLORIDE 0.9 % IV BOLUS
1000.0000 mL | Freq: Once | INTRAVENOUS | Status: AC
  Administered 2017-06-26: 1000 mL via INTRAVENOUS

## 2017-06-26 MED ORDER — METHYLPREDNISOLONE SODIUM SUCC 125 MG IJ SOLR
125.0000 mg | Freq: Once | INTRAMUSCULAR | Status: AC
  Administered 2017-06-26: 125 mg via INTRAVENOUS
  Filled 2017-06-26: qty 2

## 2017-06-26 MED ORDER — IOPAMIDOL (ISOVUE-370) INJECTION 76%
INTRAVENOUS | Status: AC
  Filled 2017-06-26: qty 100

## 2017-06-26 MED ORDER — AZITHROMYCIN 250 MG PO TABS: ORAL_TABLET | ORAL | 0 refills | Status: DC

## 2017-06-26 MED ORDER — ALBUTEROL (5 MG/ML) CONTINUOUS INHALATION SOLN
15.0000 mg/h | INHALATION_SOLUTION | Freq: Once | RESPIRATORY_TRACT | Status: AC
  Administered 2017-06-26: 15 mg/h via RESPIRATORY_TRACT
  Filled 2017-06-26: qty 20

## 2017-06-26 MED ORDER — MAGNESIUM SULFATE 2 GM/50ML IV SOLN
2.0000 g | Freq: Once | INTRAVENOUS | Status: AC
  Administered 2017-06-26: 2 g via INTRAVENOUS
  Filled 2017-06-26: qty 50

## 2017-06-26 MED ORDER — IPRATROPIUM BROMIDE 0.02 % IN SOLN
0.5000 mg | Freq: Once | RESPIRATORY_TRACT | Status: AC
  Administered 2017-06-26: 0.5 mg via RESPIRATORY_TRACT
  Filled 2017-06-26: qty 2.5

## 2017-06-26 MED ORDER — ALBUTEROL SULFATE (2.5 MG/3ML) 0.083% IN NEBU
5.0000 mg | INHALATION_SOLUTION | Freq: Once | RESPIRATORY_TRACT | Status: AC
  Administered 2017-06-26: 5 mg via RESPIRATORY_TRACT
  Filled 2017-06-26: qty 6

## 2017-06-26 MED ORDER — PREDNISONE 10 MG PO TABS: ORAL_TABLET | ORAL | 0 refills | Status: AC

## 2017-06-26 MED ORDER — TIOTROPIUM BROMIDE MONOHYDRATE 18 MCG IN CAPS: 18.0000 ug | ORAL_CAPSULE | Freq: Every day | RESPIRATORY_TRACT | 0 refills | Status: DC

## 2017-06-26 NOTE — ED Notes (Signed)
Bed: WA15 Expected date:  Expected time:  Means of arrival:  Comments: EMS-[facility] 

## 2017-06-26 NOTE — ED Notes (Signed)
Discharge information reviewed with pt. [facility] notified of need for transport due to home O2 use

## 2017-06-26 NOTE — ED Triage Notes (Signed)
Per EMS pt from home with a c/o increased ShOB, was at [facility] hospital for same on Monday, however left AMA after feeling better. Pt was given 10 mg albuterol, 0.5 mg of atrovent en route. PIV established.

## 2017-06-26 NOTE — Discharge Instructions (Signed)
Nice to meet you Taylor [name].  We wrote several prescriptions to hopefully continue to improve your breathing. It will be important to continue this new steroid taper through until the end to continue to control your breathing. We refilled your spiriva to help your breathing throughout the day also. Continue taking your spiriva and dulera each day, and using your nebulizer and albuterol inhaler when you need it. We also prescribed an antibiotic used in COPD that you will take for five days. Continue using your home oxygen. We also provided a refill for lexapro. It'll be important to make an appointment with your primary care doctor and get in to see your lung doctor.

## 2017-06-26 NOTE — ED Notes (Signed)
Pt asked for one more breathing treatment before discharge. Medication ordered and administered

## 2017-06-26 NOTE — ED Provider Notes (Signed)
[facility] DEPT Provider Note   CSN: [id number] Arrival date & time: 06/26/17  6433   History   Chief Complaint Chief Complaint  Patient presents with  . Shortness of Breath    HPI Taylor [name] is a 62 y.o. male with past medical history of COPD, bipolar disorder, alcohol use, prior colon cancer, prostate cancer who presented to the ED with shortness of breath.  He reports progressive shortness of breath in the last several days with associated feeling generally weak, difficulty sleeping.  He has had multiple recent admissions for COPD exacerbations, most recent 5/23-5/28, with ED visit 6/3.  He states he was prescribed a steroid taper which he stopped taking a couple of days ago due to difficulty with blood sugar control.  He also notes that he is out of his Spiriva for the last week.  He is continued his Dulera and has been using his nebulizers every 4 hours which have not improved symptoms.  He notes some associated chest tightness, no increase in cough, no fever.   Past Medical History:  Diagnosis Date  . Asthma   . Bipolar 1 disorder ([place]) 02/05/2012  . Colon cancer ([place]) 04/11/11   adenocarcinoma of colon, 7/19 nodes pos.FINISHED CHEMO/DR. SHERRILL  . COPD (chronic obstructive pulmonary disease) ([place])    SMOKER  . Depression   . Emphysema of lung ([place])   . Full dentures   . Hemorrhoids   . On home oxygen therapy    "3L; 24/7" (05/29/2017)  . Pneumonia ~ 2016   "double pneumonia"  . Prostate cancer ([place])    Gleason score = 7, supposed to have radiation therapy but he has not followed up (05/29/2017)  . Rib fractures    hx of    Patient Active Problem List   Diagnosis Date Noted  . Malnutrition of moderate degree 06/14/2017  . Hyperglycemia 06/08/2017  . Constipation 06/08/2017  . SIRS (systemic inflammatory response syndrome) ([place]) 05/21/2017  . Tobacco abuse 05/13/2017  . HLD (hyperlipidemia) 05/13/2017  . Leukocytosis 04/06/2017    . Adjustment disorder with depressed mood 03/28/2017  . Adjustment disorder with mixed disturbance of emotions and conduct   . Chronic respiratory failure with hypoxia ([place]) 03/24/2017  . Normocytic normochromic anemia 03/24/2017  . COPD with acute exacerbation ([place]) 10/22/2016  . Alcohol abuse 01/09/2013  . COPD exacerbation ([place]) 01/09/2013  . Chest pain 01/09/2013  . Smoker 12/16/2012  . Inguinal hernia unilateral, non-recurrent, right 05/01/2012  . Dyspepsia 02/05/2012  . Bipolar 1 disorder ([place]) 02/05/2012  . 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 COPD  GOLD III 02/03/2012  . Thrombocytopenia ([place]) 02/03/2012  . Depression   . 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 COLON SURGERY  04/11/11   Sigmoid colectomy  . COLOSTOMY REVISION  04/11/2011   Procedure: COLON RESECTION SIGMOID;  Surgeon: [name], MD;  Location: [facility]S;  Service: General;  Laterality: N/A;  low anterior colon resection   . HERNIA REPAIR    . INGUINAL HERNIA REPAIR Right 05/01/2012   Procedure: HERNIA REPAIR INGUINAL ADULT;  Surgeon: [name], MD;  Location: [facility];  Service: General;  Laterality: Right;  . INSERTION OF MESH Right 05/01/2012   Procedure: INSERTION OF MESH;  Surgeon: [name], MD;  Location: [facility];  Service: General;  Laterality: Right;  . PORT-A-CATH REMOVAL Left 05/01/2012   Procedure: REMOVAL Infusion Port;  Surgeon: Merlinda Frederick  Gerkin, MD;  Location: [facility];  Service: General;  Laterality: Left;  . PORTACATH PLACEMENT  05/02/2011   Procedure: INSERTION PORT-A-CATH;  Surgeon: [name], MD;  Location: [facility];  Service: General;  Laterality: N/A;  . PROSTATE BIOPSY          Home Medications    Prior to Admission medications   Medication Sig Start Date End Date Taking? Authorizing Provider  acetaminophen (TYLENOL) 500 MG tablet Take 1 tablet (500 mg total) by mouth every 6 (six) hours as needed (as needed for  pain.). 06/02/17   Arrien, Jimmy Picket, MD  albuterol (PROVENTIL) (2.5 MG/3ML) 0.083% nebulizer solution Take 3 mLs (2.5 mg total) by nebulization every 6 (six) hours as needed for wheezing or shortness of breath. Dx: J44.9 06/02/17 07/02/17  Arrien, Jimmy Picket, MD  ARIPiprazole (ABILIFY) 5 MG tablet Take 1 tablet (5 mg total) by mouth every morning. Patient taking differently: Take 5 mg by mouth daily.  05/18/17   Nita Sells, MD  azithromycin (ZITHROMAX Z-PAK) 250 MG tablet Take 500 mg (two tablets) on first day, then one tablet each day until out. 06/26/17   Tawny Asal, MD  escitalopram (LEXAPRO) 20 MG tablet Take 1 tablet (20 mg total) by mouth every morning. 06/26/17   Tawny Asal, MD  lovastatin (MEVACOR) 20 MG tablet Take 1 tablet (20 mg total) by mouth at bedtime. 05/18/17   Nita Sells, MD  mometasone-formoterol (DULERA) 100-5 MCG/ACT AERO Inhale 2 puffs into the lungs 2 (two) times daily. 05/18/17   Nita Sells, MD  OXYGEN Inhale 3 L into the lungs.     [provider]  polyethylene glycol (MIRALAX / GLYCOLAX) packet Take 17 g by mouth daily as needed for mild constipation. 06/09/17   Samuella Cota, MD  predniSONE (DELTASONE) 10 MG tablet Take 4 tablets (40 mg total) by mouth daily with breakfast for 4 days, THEN 3 tablets (30 mg total) daily with breakfast for 2 days, THEN 2 tablets (20 mg total) daily with breakfast for 2 days, THEN 1 tablet (10 mg total) daily with breakfast for 2 days. 06/26/17 07/06/17  Tawny Asal, MD  tamsulosin (FLOMAX) 0.4 MG CAPS capsule Take 1 capsule (0.4 mg total) by mouth daily. 06/18/17   Donne Hazel, MD  tiotropium (SPIRIVA HANDIHALER) 18 MCG inhalation capsule Place 1 capsule (18 mcg total) into inhaler and inhale daily. 06/26/17   Tawny Asal, MD    Family History Family History  Problem Relation Age of Onset  . Heart disease Father   . Lung cancer Maternal Uncle        smoked    Social History Social  History   Tobacco Use  . Smoking status: Former Smoker    Packs/day: 0.10    Years: 46.00    Pack years: 4.60    Types: Cigarettes    Last attempt to quit: 05/21/2017    Years since quitting: 0.0  . Smokeless tobacco: Former Systems developer    Types: Chew  Substance Use Topics  . Alcohol use: Not Currently    Comment: h/o use in the past, no h/o heavy use  . Drug use: No     Allergies   Codeine   Review of Systems Review of Systems  Constitutional: Positive for fatigue. Negative for fever.  HENT: Negative for sore throat.   Respiratory: Positive for chest tightness, shortness of breath and wheezing. Negative for cough.   Cardiovascular: Negative for palpitations.  Gastrointestinal: Negative for abdominal pain,  nausea and vomiting.  Neurological: Negative for syncope.  Psychiatric/Behavioral: Positive for sleep disturbance.     Physical Exam Updated Vital Signs BP 107/66 (BP Location: Left Arm)   Pulse 92   Temp 98.3 F (36.8 C)   Resp 17   SpO2 97%   General: Chronically ill appearing gentleman, sitting up in ED stretcher, mild distress  Head: Normocephalic, atraumatic Eyes: Normal conjuctiva, PERRL ENT: No pharyngeal exudate, moist mucus membranes  CV: RRR, no murmur appreciated  Resp: Diminished breath sounds and air movement bilaterally, mild tachypnea, able to speak in short sentences   Abd: Soft, +BS, non-tender to palpation  Extr: Mild LE edema, good peripheral pulses  Neuro: Alert and oriented x3, moving all extremities spontaneously  Skin: Warm, dry      ED Treatments / Results  Labs (all labs ordered are listed, but only abnormal results are displayed) Labs Reviewed  CBC WITH DIFFERENTIAL/PLATELET - Abnormal; Notable for the following components:      Result Value   RBC 4.03 (*)    Hemoglobin 12.5 (*)    All other components within normal limits  COMPREHENSIVE METABOLIC PANEL - Abnormal; Notable for the following components:   Chloride 99 (*)    CO2 37  (*)    Glucose, Bld 239 (*)    BUN 24 (*)    Calcium 8.4 (*)    Total Protein 5.8 (*)    All other components within normal limits  BLOOD GAS, VENOUS - Abnormal; Notable for the following components:   pCO2, Ven 75.3 (*)    Bicarbonate 33.3 (*)    Acid-Base Excess 4.7 (*)    All other components within normal limits  CBG MONITORING, ED - Abnormal; Notable for the following components:   Glucose-Capillary 234 (*)    All other components within normal limits  I-STAT TROPONIN, ED    EKG EKG Interpretation  Date/Time:  Thursday June 26 2017 10:19:30 EDT Ventricular Rate:  94 PR Interval:    QRS Duration: 86 QT Interval:  330 QTC Calculation: 417 R Axis:   70 Text Interpretation:  Sinus rhythm Low voltage, precordial leads Nonspecific T abnrm, anterolateral leads ST elevation, consider inferior injury No significant change since last tracing Confirmed by Wandra Arthurs [phone]) on 06/26/2017 10:54:28 AM   Radiology Ct Angio Chest Pe W And/or Wo Contrast  Result Date: 06/26/2017 CLINICAL DATA:  62 year old male with increase shortness of breath. EXAM: CT ANGIOGRAPHY CHEST WITH CONTRAST TECHNIQUE: Multidetector CT imaging of the chest was performed using the standard protocol during bolus administration of intravenous contrast. Multiplanar CT image reconstructions and MIPs were obtained to evaluate the vascular anatomy. CONTRAST:  173mL ISOVUE-370 IOPAMIDOL (ISOVUE-370) INJECTION 76% COMPARISON:  Chest CTA 05/21/2017 and earlier. FINDINGS: Cardiovascular: Adequate contrast bolus timing in the pulmonary arterial tree. Mild respiratory motion artifact at the lung bases. No focal filling defect identified in the pulmonary arteries to suggest acute pulmonary embolism. Negative visible aorta aside from mild soft and occasionally calcified atherosclerotic plaque. Calcified coronary artery atherosclerosis was more apparent on the comparison CTA. No cardiomegaly or pericardial effusion.  Mediastinum/Nodes: Negative.  No lymphadenopathy. Lungs/Pleura: Centrilobular and paraseptal emphysema. The major airways are patent. Lung volumes are similar to the 05/21/2017 exam. No pleural effusion. No acute pulmonary opacity. Upper Abdomen: Negative visible liver, gallbladder, spleen, pancreas, adrenal glands, kidneys, and bowel in the upper abdomen. Musculoskeletal: Stable visualized osseous structures. Osteopenia. Lower thoracic superior endplate Schmorl's nodes. Review of the MIP images confirms the above findings.  IMPRESSION: 1.  Negative for acute pulmonary embolus. 2.  Emphysema (ICD10-J43.9).  No acute pulmonary findings. Electronically Signed   By: Genevie Ann M.D.   On: 06/26/2017 14:19   Dg Chest Port 1 View  Result Date: 06/26/2017 CLINICAL DATA:  Shortness of Breath EXAM: PORTABLE CHEST 1 VIEW COMPARISON:  06/23/2017 FINDINGS: Cardiac shadow is within normal limits. The lungs are hyperinflated consistent with COPD. No focal infiltrate or sizable effusion is noted. No pneumothorax is seen. Old rib fractures on the right are again seen. IMPRESSION: COPD without acute abnormality. Electronically Signed   By: Inez Catalina M.D.   On: 06/26/2017 10:06    Procedures Procedures (including critical care time)  Medications Ordered in ED Medications  iopamidol (ISOVUE-370) 76 % injection (has no administration in time range)  magnesium sulfate IVPB 2 g 50 mL (2 g Intravenous New Bag/Given 06/26/17 1033)  albuterol (PROVENTIL,VENTOLIN) solution continuous neb (15 mg/hr Nebulization Given 06/26/17 1109)  ipratropium (ATROVENT) nebulizer solution 0.5 mg (0.5 mg Nebulization Given 06/26/17 1109)  methylPREDNISolone sodium succinate (SOLU-MEDROL) 125 mg/2 mL injection 125 mg (125 mg Intravenous Given 06/26/17 1033)  sodium chloride 0.9 % bolus 1,000 mL (0 mLs Intravenous Stopped 06/26/17 1145)  iopamidol (ISOVUE-370) 76 % injection 100 mL (100 mLs Intravenous Contrast Given 06/26/17 1413)     Initial  Impression / Assessment and Plan / ED Course  I have reviewed the triage vital signs and the nursing notes.  Pertinent labs & imaging results that were available during my care of the patient were reviewed by me and considered in my medical decision making (see chart for details).   62 year old male with history of COPD recent admissions for exacerbations.  He states he was last discharged on a steroid taper, however he did not complete this as prescribed and stopped early due to blood sugar issues.  On chart review, it appears that his taper does plan to run through 6/7.  Prior work-up during these presentations has included negative infectious work-up and negative CTA.  No new symptoms or chest x-ray findings to suggest new pneumonia.  Exam with diminished air movement and wheezing.  Received initial breathing treatments with EMS, will continue breathing treatment, 125 mg Solu-Medrol.  Patient was given magnesium, IV fluids.  Presentation is again consistent with COPD exacerbation, likely due to under treatment to to medication nonadherence with steroid course, running out of maintenance inhaler.   Routine labs returned similar to prior values with chronic bicarb increase, vbg also with respiratory acidosis, similar ph and CO2 to last blood gas. BP soft and received 2 L IVF bolus over ED course. Pt somewhat better after initial breathing tx but still some shortness of breath, no wheezing on rpt exam but not expected increase in air movement after nebs. Will repeat CTA here in case component of his sx is due to VTE. Also discussed case with hospitalist for potential admission given his acidosis on vbg. Pt initially was hesitant for admission, evaluated by hospitalist who felt he was appropriate for outpatient management at this point.     CTA was again negative for PE or other acute changes. Appears stable and ambulated pt with maintained O2 saturations on home 3 L O2. Will discharge pt, re-prescribe  steroid taper given prior inadequate treatment, also azithromycin, and refill spiriva for better maintenance control. Encouraged improved adherence for steroid taper. Recommend close follow up with PCP and pulmonologist for improved respiratory control, and glycemic control while on steroid course. Requested lexapro refill.  Discussed plan with pt who is agreeable.   Final Clinical Impressions(s) / ED Diagnoses   Final diagnoses:  COPD exacerbation [facility])    ED Discharge Orders        Ordered    tiotropium (SPIRIVA HANDIHALER) 18 MCG inhalation capsule  Daily     06/26/17 1430    escitalopram (LEXAPRO) 20 MG tablet  BH-each morning     06/26/17 1556    predniSONE (DELTASONE) 10 MG tablet     06/26/17 1556    azithromycin (ZITHROMAX Z-PAK) 250 MG tablet     06/26/17 1556       Tawny Asal, MD 06/26/17 1559    Drenda Freeze, MD 06/28/17 1214

## 2017-07-16 ENCOUNTER — Emergency Department (HOSPITAL_COMMUNITY)
Admission: EM | Admit: 2017-07-16 | Discharge: 2017-07-17 | Disposition: A | Discharge status: Home / Self Care | Payer: Medicaid Other | Attending: Emergency Medicine | Admitting: Emergency Medicine

## 2017-07-16 DIAGNOSIS — Z760 Encounter for issue of repeat prescription: Secondary | ICD-10-CM | POA: Diagnosis not present

## 2017-07-16 DIAGNOSIS — J441 Chronic obstructive pulmonary disease with (acute) exacerbation: Secondary | ICD-10-CM | POA: Diagnosis not present

## 2017-07-16 DIAGNOSIS — R062 Wheezing: Secondary | ICD-10-CM | POA: Diagnosis present

## 2017-07-16 DIAGNOSIS — Z79899 Other long term (current) drug therapy: Secondary | ICD-10-CM | POA: Insufficient documentation

## 2017-07-16 DIAGNOSIS — Z85038 Personal history of other malignant neoplasm of large intestine: Secondary | ICD-10-CM | POA: Insufficient documentation

## 2017-07-16 DIAGNOSIS — F319 Bipolar disorder, unspecified: Secondary | ICD-10-CM | POA: Diagnosis not present

## 2017-07-16 DIAGNOSIS — Z87891 Personal history of nicotine dependence: Secondary | ICD-10-CM | POA: Diagnosis not present

## 2017-07-16 DIAGNOSIS — Z8546 Personal history of malignant neoplasm of prostate: Secondary | ICD-10-CM | POA: Diagnosis not present

## 2017-07-16 NOTE — ED Triage Notes (Signed)
Patient BIB EMS for SOB x2 days. Patient coincidentally has been out of his prednisone for 2 days. Pt normally on 2L of O2 and has a hx of COPD.

## 2017-07-17 ENCOUNTER — Emergency Department (HOSPITAL_COMMUNITY): Payer: Medicaid Other

## 2017-07-17 LAB — CBC WITH DIFFERENTIAL/PLATELET
BASOS PCT: 0 %
Basophils Absolute: 0 10*3/uL (ref 0.0–0.1)
EOS PCT: 1 %
Eosinophils Absolute: 0 10*3/uL (ref 0.0–0.7)
HCT: 40.8 % (ref 39.0–52.0)
Hemoglobin: 13.3 g/dL (ref 13.0–17.0)
Lymphocytes Relative: 9 %
Lymphs Abs: 0.6 10*3/uL — ABNORMAL LOW (ref 0.7–4.0)
MCH: 31.4 pg (ref 26.0–34.0)
MCHC: 32.6 g/dL (ref 30.0–36.0)
MCV: 96.2 fL (ref 78.0–100.0)
MONO ABS: 0.2 10*3/uL (ref 0.1–1.0)
Monocytes Relative: 3 %
NEUTROS ABS: 6 10*3/uL (ref 1.7–7.7)
NEUTROS PCT: 87 %
PLATELETS: 186 10*3/uL (ref 150–400)
RBC: 4.24 MIL/uL (ref 4.22–5.81)
RDW: 13.5 % (ref 11.5–15.5)
WBC: 6.9 10*3/uL (ref 4.0–10.5)

## 2017-07-17 LAB — I-STAT CHEM 8, ED
BUN: 27 mg/dL — ABNORMAL HIGH (ref 8–23)
Calcium, Ion: 1.16 mmol/L (ref 1.15–1.40)
Chloride: 96 mmol/L — ABNORMAL LOW (ref 98–111)
Creatinine, Ser: 0.6 mg/dL — ABNORMAL LOW (ref 0.61–1.24)
Glucose, Bld: 212 mg/dL — ABNORMAL HIGH (ref 70–99)
HEMATOCRIT: 38 % — AB (ref 39.0–52.0)
HEMOGLOBIN: 12.9 g/dL — AB (ref 13.0–17.0)
Potassium: 4.2 mmol/L (ref 3.5–5.1)
Sodium: 140 mmol/L (ref 135–145)
TCO2: 34 mmol/L — ABNORMAL HIGH (ref 22–32)

## 2017-07-17 LAB — I-STAT TROPONIN, ED: TROPONIN I, POC: 0 ng/mL (ref 0.00–0.08)

## 2017-07-17 MED ORDER — METHYLPREDNISOLONE SODIUM SUCC 125 MG IJ SOLR
125.0000 mg | Freq: Once | INTRAMUSCULAR | Status: AC
  Administered 2017-07-17: 125 mg via INTRAVENOUS
  Filled 2017-07-17: qty 2

## 2017-07-17 MED ORDER — TAMSULOSIN HCL 0.4 MG PO CAPS: 0.4000 mg | ORAL_CAPSULE | Freq: Every day | ORAL | 0 refills | Status: DC

## 2017-07-17 MED ORDER — ALBUTEROL SULFATE HFA 108 (90 BASE) MCG/ACT IN AERS: 1.0000 | INHALATION_SPRAY | Freq: Four times a day (QID) | RESPIRATORY_TRACT | 0 refills | Status: DC | PRN

## 2017-07-17 MED ORDER — ALBUTEROL SULFATE (2.5 MG/3ML) 0.083% IN NEBU
5.0000 mg | INHALATION_SOLUTION | Freq: Once | RESPIRATORY_TRACT | Status: AC
  Administered 2017-07-17: 5 mg via RESPIRATORY_TRACT
  Filled 2017-07-17 (×2): qty 6

## 2017-07-17 MED ORDER — PREDNISONE 20 MG PO TABS: ORAL_TABLET | ORAL | 0 refills | Status: DC
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
## 2017-07-17 NOTE — ED Notes (Signed)
Patient transported to X-ray 

## 2017-08-11 ENCOUNTER — Ambulatory Visit: Payer: Self-pay | Admitting: Internal Medicine

## 2017-08-13 ENCOUNTER — Encounter (HOSPITAL_COMMUNITY): Payer: Self-pay | Admitting: Emergency Medicine

## 2017-08-13 ENCOUNTER — Other Ambulatory Visit: Payer: Self-pay

## 2017-08-13 ENCOUNTER — Emergency Department (HOSPITAL_COMMUNITY): Payer: Medicaid Other

## 2017-08-13 ENCOUNTER — Emergency Department (HOSPITAL_COMMUNITY)
Admission: EM | Admit: 2017-08-13 | Discharge: 2017-08-13 | Disposition: A | Discharge status: Home / Self Care | Payer: Medicaid Other | Attending: Emergency Medicine | Admitting: Emergency Medicine

## 2017-08-13 DIAGNOSIS — J441 Chronic obstructive pulmonary disease with (acute) exacerbation: Secondary | ICD-10-CM | POA: Diagnosis not present

## 2017-08-13 DIAGNOSIS — Z87891 Personal history of nicotine dependence: Secondary | ICD-10-CM | POA: Diagnosis not present

## 2017-08-13 DIAGNOSIS — Z8546 Personal history of malignant neoplasm of prostate: Secondary | ICD-10-CM | POA: Diagnosis not present

## 2017-08-13 DIAGNOSIS — R0602 Shortness of breath: Secondary | ICD-10-CM | POA: Diagnosis present

## 2017-08-13 DIAGNOSIS — E785 Hyperlipidemia, unspecified: Secondary | ICD-10-CM | POA: Insufficient documentation

## 2017-08-13 DIAGNOSIS — Z79899 Other long term (current) drug therapy: Secondary | ICD-10-CM | POA: Insufficient documentation

## 2017-08-13 DIAGNOSIS — J449 Chronic obstructive pulmonary disease, unspecified: Secondary | ICD-10-CM

## 2017-08-13 LAB — CBC
HEMATOCRIT: 40.4 % (ref 39.0–52.0)
HEMOGLOBIN: 12.8 g/dL — AB (ref 13.0–17.0)
MCH: 30.3 pg (ref 26.0–34.0)
MCHC: 31.7 g/dL (ref 30.0–36.0)
MCV: 95.5 fL (ref 78.0–100.0)
Platelets: 209 10*3/uL (ref 150–400)
RBC: 4.23 MIL/uL (ref 4.22–5.81)
RDW: 12.7 % (ref 11.5–15.5)
WBC: 7.3 10*3/uL (ref 4.0–10.5)

## 2017-08-13 LAB — BASIC METABOLIC PANEL
ANION GAP: 7 (ref 5–15)
BUN: 20 mg/dL (ref 8–23)
CHLORIDE: 101 mmol/L (ref 98–111)
CO2: 33 mmol/L — AB (ref 22–32)
Calcium: 8.6 mg/dL — ABNORMAL LOW (ref 8.9–10.3)
Creatinine, Ser: 0.76 mg/dL (ref 0.61–1.24)
GFR calc non Af Amer: >60 mL/min (ref >60)
GLUCOSE: 120 mg/dL — AB (ref 70–99)
POTASSIUM: 4.2 mmol/L (ref 3.5–5.1)
Sodium: 141 mmol/L (ref 135–145)

## 2017-08-13 LAB — I-STAT TROPONIN, ED: TROPONIN I, POC: 0.01 ng/mL (ref 0.00–0.08)

## 2017-08-13 MED ORDER — IPRATROPIUM BROMIDE 0.02 % IN SOLN
0.5000 mg | Freq: Once | RESPIRATORY_TRACT | Status: AC
  Administered 2017-08-13: 0.5 mg via RESPIRATORY_TRACT
  Filled 2017-08-13: qty 2.5

## 2017-08-13 MED ORDER — PREDNISONE 20 MG PO TABS: ORAL_TABLET | ORAL | 0 refills | Status: DC

## 2017-08-13 MED ORDER — IPRATROPIUM BROMIDE 0.02 % IN SOLN: 0.5000 mg | Freq: Two times a day (BID) | RESPIRATORY_TRACT | 3 refills | Status: DC | PRN

## 2017-08-13 MED ORDER — ALBUTEROL SULFATE (2.5 MG/3ML) 0.083% IN NEBU
5.0000 mg | INHALATION_SOLUTION | Freq: Once | RESPIRATORY_TRACT | Status: AC
  Administered 2017-08-13: 5 mg via RESPIRATORY_TRACT
  Filled 2017-08-13: qty 6

## 2017-08-13 MED ORDER — ALBUTEROL SULFATE (5 MG/ML) 0.5% IN NEBU: 5.0000 mg | INHALATION_SOLUTION | Freq: Four times a day (QID) | RESPIRATORY_TRACT | 3 refills | Status: DC | PRN

## 2017-08-13 MED ORDER — METHYLPREDNISOLONE SODIUM SUCC 125 MG IJ SOLR
125.0000 mg | Freq: Once | INTRAMUSCULAR | Status: AC
  Administered 2017-08-13: 125 mg via INTRAVENOUS
  Filled 2017-08-13: qty 2

## 2017-08-13 MED ORDER — IPRATROPIUM BROMIDE 0.02 % IN SOLN: 0.5000 mg | Freq: Four times a day (QID) | RESPIRATORY_TRACT | 3 refills | Status: DC

## 2017-08-13 NOTE — ED Notes (Signed)
ED Provider at bedside. 

## 2017-08-13 NOTE — ED Notes (Signed)
PT states understanding of care given, follow up care, and medication prescribed. PT  Left with [facility] at this time.

## 2017-08-13 NOTE — Discharge Instructions (Signed)
Recommend scheduled nebs every 4-6 hours for the next 24 hours, then can reduce to as needed. Start taking the prednisone taper I have written for you, once completed you can resume your daily prednisone dosing. Follow-up with your primary care doctor.  Hold onto the labs and chest x-ray print outs that I have given for you to let them review this. Return to the ED for new or worsening symptoms.

## 2017-08-13 NOTE — ED Notes (Signed)
Patient transported to X-ray 

## 2017-08-13 NOTE — ED Provider Notes (Signed)
[facility] EMERGENCY DEPARTMENT Provider Note   CSN: [id number] Arrival date & time: 08/13/17  0014     History   Chief Complaint Chief Complaint  Patient presents with  . COPD  . Chest Pain    HPI Taylor [name] is a 62 y.o. male.  The history is provided by the patient and medical records.  COPD  Associated symptoms include chest pain and shortness of breath.  Chest Pain   Associated symptoms include cough and shortness of breath.    62 year old male with history of asthma, bipolar disorder, history of colon cancer, COPD, depression, emphysema, history of prostate cancer, presenting to the ED with shortness of breath and chest pain.  Patient states recently he has been feeling very short of breath, more so than his baseline.  States he feels very fatigued with low energy.  Shortness of breath is worse when trying to get up and move around.  He uses 3 L at baseline, has been having to turn this up to 4 L at home intermittently.  Does report cough but denies fever or chills.  Has not had any nausea or vomiting.  Reports chest pain seems like a "burning sensation" diffusely through his chest.  States it almost feels like acid reflux but more "widespread".  He has no known cardiac history.  He is a former smoker.  States recently his primary care doctor came to his home for a visit and refilled some of his medications they are, restarted his prednisone for another month, is taking 5 mg daily.  States they forgot to refill his albuterol, but refilled all of his other medications.  Past Medical History:  Diagnosis Date  . Asthma   . Bipolar 1 disorder ([place]) 02/05/2012  . Colon cancer ([place]) 04/11/11   adenocarcinoma of colon, 7/19 nodes pos.FINISHED CHEMO/DR. SHERRILL  . COPD (chronic obstructive pulmonary disease) ([place])    SMOKER  . Depression   . Emphysema of lung ([place])   . Full dentures   . Hemorrhoids   . On home oxygen therapy    "3L; 24/7" (05/29/2017)    . Pneumonia ~ 2016   "double pneumonia"  . Prostate cancer ([place])    Gleason score = 7, supposed to have radiation therapy but he has not followed up (05/29/2017)  . Rib fractures    hx of    Patient Active Problem List   Diagnosis Date Noted  . Malnutrition of moderate degree 06/14/2017  . Hyperglycemia 06/08/2017  . Constipation 06/08/2017  . SIRS (systemic inflammatory response syndrome) ([place]) 05/21/2017  . Tobacco abuse 05/13/2017  . HLD (hyperlipidemia) 05/13/2017  . Leukocytosis 04/06/2017  . Adjustment disorder with depressed mood 03/28/2017  . Adjustment disorder with mixed disturbance of emotions and conduct   . Chronic respiratory failure with hypoxia ([place]) 03/24/2017  . Normocytic normochromic anemia 03/24/2017  . COPD with acute exacerbation ([place]) 10/22/2016  . Alcohol abuse 01/09/2013  . COPD exacerbation ([place]) 01/09/2013  . Chest pain 01/09/2013  . Smoker 12/16/2012  . Inguinal hernia unilateral, non-recurrent, right 05/01/2012  . Dyspepsia 02/05/2012  . Bipolar 1 disorder ([place]) 02/05/2012  . 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 COPD  GOLD III 02/03/2012  . Thrombocytopenia ([place]) 02/03/2012  . Depression   . 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 COLON SURGERY  04/11/11   Sigmoid colectomy  . COLOSTOMY REVISION  04/11/2011   Procedure: COLON RESECTION SIGMOID;  Surgeon: [name], MD;  Location: [facility];  Service: General;  Laterality: N/A;  low anterior colon resection   . HERNIA REPAIR    . INGUINAL HERNIA REPAIR Right 05/01/2012   Procedure: HERNIA REPAIR INGUINAL ADULT;  Surgeon: [name], MD;  Location: [facility];  Service: General;  Laterality: Right;  . INSERTION OF MESH Right 05/01/2012   Procedure: INSERTION OF MESH;  Surgeon: [name], MD;  Location: [facility];  Service: General;  Laterality: Right;  . PORT-A-CATH REMOVAL Left 05/01/2012   Procedure: REMOVAL Infusion Port;   Surgeon: [name], MD;  Location: [facility];  Service: General;  Laterality: Left;  . PORTACATH PLACEMENT  05/02/2011   Procedure: INSERTION PORT-A-CATH;  Surgeon: [name], MD;  Location: [facility];  Service: General;  Laterality: N/A;  . PROSTATE BIOPSY          Home Medications    Prior to Admission medications   Medication Sig Start Date End Date Taking? Authorizing Provider  acetaminophen (TYLENOL) 500 MG tablet Take 1 tablet (500 mg total) by mouth every 6 (six) hours as needed (as needed for pain.). 06/02/17   Arrien, Jimmy Picket, MD  albuterol (PROVENTIL HFA;VENTOLIN HFA) 108 (90 Base) MCG/ACT inhaler Inhale 1-2 puffs into the lungs every 6 (six) hours as needed for wheezing or shortness of breath. 07/17/17   Palumbo, April, MD  albuterol (PROVENTIL) (2.5 MG/3ML) 0.083% nebulizer solution Take 3 mLs (2.5 mg total) by nebulization every 6 (six) hours as needed for wheezing or shortness of breath. Dx: J44.9 06/02/17 07/02/17  Arrien, Jimmy Picket, MD  ARIPiprazole (ABILIFY) 5 MG tablet Take 1 tablet (5 mg total) by mouth every morning. Patient taking differently: Take 5 mg by mouth daily.  05/18/17   Nita Sells, MD  azithromycin (ZITHROMAX Z-PAK) 250 MG tablet Take 500 mg (two tablets) on first day, then one tablet each day until out. Patient not taking: Reported on 07/17/2017 06/26/17   Tawny Asal, MD  escitalopram (LEXAPRO) 20 MG tablet Take 1 tablet (20 mg total) by mouth every morning. 06/26/17   Tawny Asal, MD  lovastatin (MEVACOR) 20 MG tablet Take 1 tablet (20 mg total) by mouth at bedtime. 05/18/17   Nita Sells, MD  mometasone-formoterol (DULERA) 100-5 MCG/ACT AERO Inhale 2 puffs into the lungs 2 (two) times daily. 05/18/17   Nita Sells, MD  OXYGEN Inhale 3 L into the lungs.     [provider]  polyethylene glycol (MIRALAX / GLYCOLAX) packet Take 17 g by mouth daily as needed for mild constipation. 06/09/17   Samuella Cota, MD    predniSONE (DELTASONE) 20 MG tablet 3 tabs po day one, then 2 po daily x 4 days 07/17/17   Randal Buba, April, MD  tamsulosin (FLOMAX) 0.4 MG CAPS capsule Take 1 capsule (0.4 mg total) by mouth daily. 06/18/17   Donne Hazel, MD  tamsulosin (FLOMAX) 0.4 MG CAPS capsule Take 1 capsule (0.4 mg total) by mouth daily. 07/17/17   Palumbo, April, MD  tiotropium (SPIRIVA HANDIHALER) 18 MCG inhalation capsule Place 1 capsule (18 mcg total) into inhaler and inhale daily. 06/26/17   Tawny Asal, MD    Family History Family History  Problem Relation Age of Onset  . Heart disease Father   . Lung cancer Maternal Uncle        smoked    Social History Social History   Tobacco Use  . Smoking status: Former Smoker    Packs/day: 0.10  Years: 46.00    Pack years: 4.60    Types: Cigarettes    Last attempt to quit: 05/21/2017    Years since quitting: 0.2  . Smokeless tobacco: Former Systems developer    Types: Chew  Substance Use Topics  . Alcohol use: Not Currently    Comment: h/o use in the past, no h/o heavy use  . Drug use: No     Allergies   Codeine   Review of Systems Review of Systems  Respiratory: Positive for cough, shortness of breath and wheezing.   Cardiovascular: Positive for chest pain.  All other systems reviewed and are negative.    Physical Exam Updated Vital Signs BP 122/81 (BP Location: Left Arm)   Pulse (!) 133   Temp 99.3 F (37.4 C) (Oral)   Resp (!) 21   Ht 5\' 11"  (1.803 m)   Wt 72.6 kg (160 lb)   SpO2 100%   BMI 22.32 kg/m   Physical Exam  Constitutional: He is oriented to person, place, and time. He appears well-developed and well-nourished.  HENT:  Head: Normocephalic and atraumatic.  Mouth/Throat: Oropharynx is clear and moist.  Eyes: Pupils are equal, round, and reactive to light. Conjunctivae and EOM are normal.  Neck: Normal range of motion.  Cardiovascular: Normal rate, regular rhythm and normal heart sounds.  Pulmonary/Chest: Effort normal. No accessory  muscle usage. He has no wheezes. He has no rales.  Breath sounds seem somewhat diminished but he does have some expiratory wheezes, is able to talk in sentences but seems winded when doing so, O2 sats 98% during exam  Abdominal: Soft. Bowel sounds are normal.  Musculoskeletal: Normal range of motion.  No peripheral edema, calf swelling, or tenderness, no palpable cords or overlying skin changes  Neurological: He is alert and oriented to person, place, and time.  Skin: Skin is warm and dry.  Psychiatric: He has a normal mood and affect.  Nursing note and vitals reviewed.    ED Treatments / Results  Labs (all labs ordered are listed, but only abnormal results are displayed) Labs Reviewed  BASIC METABOLIC PANEL - Abnormal; Notable for the following components:      Result Value   CO2 33 (*)    Glucose, Bld 120 (*)    Calcium 8.6 (*)    All other components within normal limits  CBC - Abnormal; Notable for the following components:   Hemoglobin 12.8 (*)    All other components within normal limits  I-STAT TROPONIN, ED    EKG None  Radiology Dg Chest 2 View  Result Date: 08/13/2017 CLINICAL DATA:  Increased shortness of breath on exertion, lightheadedness, and chest tightness. Recently started on prednisone. History of COPD. EXAM: CHEST - 2 VIEW COMPARISON:  07/17/2017 FINDINGS: Hyperinflation consistent with emphysema. No airspace disease or consolidation in the lungs. No blunting of costophrenic angles. No pneumothorax. Mediastinal contours appear intact. Normal heart size and pulmonary vascularity. Old right rib fractures. IMPRESSION: Emphysematous changes in the lungs. No evidence of active pulmonary disease. Electronically Signed   By: Lucienne Capers M.D.   On: 08/13/2017 01:25    Procedures Procedures (including critical care time)  Medications Ordered in ED Medications  albuterol (PROVENTIL) (2.5 MG/3ML) 0.083% nebulizer solution 5 mg (5 mg Nebulization Given 08/13/17  0045)  ipratropium (ATROVENT) nebulizer solution 0.5 mg (0.5 mg Nebulization Given 08/13/17 0045)  methylPREDNISolone sodium succinate (SOLU-MEDROL) 125 mg/2 mL injection 125 mg (125 mg Intravenous Given 08/13/17 0120)     Initial  Impression / Assessment and Plan / ED Course  I have reviewed the triage vital signs and the nursing notes.  Pertinent labs & imaging results that were available during my care of the patient were reviewed by me and considered in my medical decision making (see chart for details).  62 year old male here with shortness of breath and chest pain.  Reports it feels like it burning sensation in his chest.  Does report cough but denies fever or chills.  He is afebrile and nontoxic.  Lung sounds are diminished but he does have some expiratory wheezes.  He is able to talk in sentences but appears winded after talking.  He has no peripheral edema does not appear fluid overloaded.  He is on 3 L at baseline.  Will give dose of Solu-Medrol as well as additional nebs.  Labs and chest x-ray pending.  Patient with mild tachycardia during initial exam, suspect this is likely related to albuterol.  Labs overall reassuring.  CXR clear.  Patient feeling better here after solu-medrol and additional treatments.  He is comfortable back on his home O2, talking easily in fluid conversation.  VSS, HR returned to baseline.  He is requesting to eat/drink.  Suspect mild COPD exacerbation, possibly worsened as he has not had his home albuterol for daily nebs  We will refill this as well as his Atrovent.  Also increase his prednisone taper for a few days, after this can resume his daily dosing.  He will follow-up closely with his primary care doctor.  Patient is comfortable with discharge home as well as his plan of care.  He will return here for any new or acute changes.  Final Clinical Impressions(s) / ED Diagnoses   Final diagnoses:  COPD (chronic obstructive pulmonary disease) ([place])    ED  Discharge Orders        Ordered    albuterol (PROVENTIL) (5 MG/ML) 0.5% nebulizer solution  Every 6 hours PRN     08/13/17 0323    predniSONE (DELTASONE) 20 MG tablet     08/13/17 0323         ipratropium (ATROVENT) 0.02 % nebulizer solution  4 times daily     08/13/17 0346       Larene Pickett, PA-C 08/13/17 6811    Dina Rich Barbette Hair, MD 08/13/17 [phone]

## 2017-08-13 NOTE — ED Notes (Signed)
Pt BIB [facility] with c/o increased shortness of breath on exertion, lightheadedness, and chest tightness. Hx COPD, recently started on prednisone. Given 324mg  aspirin, 5mg  albuterol, and 0.5mg  atrovent PTA. EMS vitals: BP 105/68, HR 134, RR 24, SpO2 95% room air.

## 2017-08-14 ENCOUNTER — Emergency Department (HOSPITAL_COMMUNITY): Payer: Medicaid Other

## 2017-08-14 ENCOUNTER — Encounter (HOSPITAL_COMMUNITY): Payer: Self-pay | Admitting: Emergency Medicine

## 2017-08-14 ENCOUNTER — Emergency Department (HOSPITAL_COMMUNITY)
Admission: EM | Admit: 2017-08-14 | Discharge: 2017-08-15 | Disposition: A | Discharge status: Home / Self Care | Payer: Medicaid Other | Attending: Emergency Medicine | Admitting: Emergency Medicine

## 2017-08-14 DIAGNOSIS — Z79899 Other long term (current) drug therapy: Secondary | ICD-10-CM | POA: Insufficient documentation

## 2017-08-14 DIAGNOSIS — R0602 Shortness of breath: Secondary | ICD-10-CM | POA: Diagnosis present

## 2017-08-14 DIAGNOSIS — J441 Chronic obstructive pulmonary disease with (acute) exacerbation: Secondary | ICD-10-CM

## 2017-08-14 DIAGNOSIS — Z87891 Personal history of nicotine dependence: Secondary | ICD-10-CM | POA: Diagnosis not present

## 2017-08-14 LAB — BASIC METABOLIC PANEL
Anion gap: 7 (ref 5–15)
BUN: 23 mg/dL (ref 8–23)
CO2: 35 mmol/L — ABNORMAL HIGH (ref 22–32)
Calcium: 8.8 mg/dL — ABNORMAL LOW (ref 8.9–10.3)
Chloride: 101 mmol/L (ref 98–111)
Creatinine, Ser: 0.81 mg/dL (ref 0.61–1.24)
GFR calc Af Amer: >60 mL/min (ref >60)
GFR calc non Af Amer: >60 mL/min (ref >60)
Glucose, Bld: 117 mg/dL — ABNORMAL HIGH (ref 70–99)
Potassium: 4.2 mmol/L (ref 3.5–5.1)
Sodium: 143 mmol/L (ref 135–145)

## 2017-08-14 LAB — CBC
HCT: 40.6 % (ref 39.0–52.0)
Hemoglobin: 12.9 g/dL — ABNORMAL LOW (ref 13.0–17.0)
MCH: 30.6 pg (ref 26.0–34.0)
MCHC: 31.8 g/dL (ref 30.0–36.0)
MCV: 96.4 fL (ref 78.0–100.0)
Platelets: 224 10*3/uL (ref 150–400)
RBC: 4.21 MIL/uL — ABNORMAL LOW (ref 4.22–5.81)
RDW: 12.9 % (ref 11.5–15.5)
WBC: 9 10*3/uL (ref 4.0–10.5)

## 2017-08-14 LAB — I-STAT TROPONIN, ED: Troponin i, poc: 0 ng/mL (ref 0.00–0.08)

## 2017-08-14 NOTE — ED Triage Notes (Signed)
Patient BIB [facility] for SOB and chest pain.  Patient was seen for the same and discharged on 7/23.  Patient states he was unable to get his medications that were prescribed and he hasnt eaten in several days.  Patient states he is at [employer] boarding house and they took his food stamps.  Patient was given 5mg /0.5mg  duoneb in route and 125mg  of solumedrol.  Patient is A&O x4.  Pain is 5/10.

## 2017-08-14 NOTE — ED Provider Notes (Signed)
[facility] Provider Note   CSN: [id number] Arrival date & time: 08/14/17  2130     History   Chief Complaint Chief Complaint  Patient presents with  . Shortness of Breath  . Chest Pain    HPI Taylor [name] is a 62 y.o. male.  HPI   89-year-old male with shortness of breath.  He is recently seen for similar complaints on 7/23 and discharged.  He said as he was able to get his prescriptions filled.  Patient received 5 mg of albuterol, 0.5 mg of Atrovent 125 mg of Solu-Medrol prior to arrival.  Reports that he is currently feeling better.  Denies any fevers or chills.  Some mild chest discomfort but says that he often feels like this when his breathing gets worse.  No unusual leg pain or swelling.  Past Medical History:  Diagnosis Date  . Asthma   . Bipolar 1 disorder ([place]) 02/05/2012  . Colon cancer ([place]) 04/11/11   adenocarcinoma of colon, 7/19 nodes pos.FINISHED CHEMO/DR. SHERRILL  . COPD (chronic obstructive pulmonary disease) ([place])    SMOKER  . Depression   . Emphysema of lung ([place])   . Full dentures   . Hemorrhoids   . On home oxygen therapy    "3L; 24/7" (05/29/2017)  . Pneumonia ~ 2016   "double pneumonia"  . Prostate cancer ([place])    Gleason score = 7, supposed to have radiation therapy but he has not followed up (05/29/2017)  . Rib fractures    hx of    Patient Active Problem List   Diagnosis Date Noted  . Malnutrition of moderate degree 06/14/2017  . Hyperglycemia 06/08/2017  . Constipation 06/08/2017  . SIRS (systemic inflammatory response syndrome) ([place]) 05/21/2017  . Tobacco abuse 05/13/2017  . HLD (hyperlipidemia) 05/13/2017  . Leukocytosis 04/06/2017  . Adjustment disorder with depressed mood 03/28/2017  . Adjustment disorder with mixed disturbance of emotions and conduct   . Chronic respiratory failure with hypoxia ([place]) 03/24/2017  . Normocytic normochromic anemia 03/24/2017  . COPD with acute exacerbation  ([place]) 10/22/2016  . Alcohol abuse 01/09/2013  . COPD exacerbation ([place]) 01/09/2013  . Chest pain 01/09/2013  . Smoker 12/16/2012  . Inguinal hernia unilateral, non-recurrent, right 05/01/2012  . Dyspepsia 02/05/2012  . Bipolar 1 disorder ([place]) 02/05/2012  . 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 COPD  GOLD III 02/03/2012  . Thrombocytopenia ([place]) 02/03/2012  . Depression   . 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 COLON SURGERY  04/11/11   Sigmoid colectomy  . COLOSTOMY REVISION  04/11/2011   Procedure: COLON RESECTION SIGMOID;  Surgeon: [name], MD;  Location: [facility];  Service: General;  Laterality: N/A;  low anterior colon resection   . HERNIA REPAIR    . INGUINAL HERNIA REPAIR Right 05/01/2012   Procedure: HERNIA REPAIR INGUINAL ADULT;  Surgeon: [name], MD;  Location: [facility];  Service: General;  Laterality: Right;  . INSERTION OF MESH Right 05/01/2012   Procedure: INSERTION OF MESH;  Surgeon: [name], MD;  [facility];  Service: General;  Laterality: Right;  . PORT-A-CATH REMOVAL Left 05/01/2012   Procedure: REMOVAL Infusion Port;  Surgeon: [name], MD;  [facility];  Service: General;  Laterality: Left;  . PORTACATH PLACEMENT  05/02/2011   Procedure: INSERTION PORT-A-CATH;  Surgeon: [name], MD;  [facility]: [facility];  Service: General;  Laterality: N/A;  . PROSTATE BIOPSY          Home Medications    Prior to Admission medications   Medication Sig Start Date End Date Taking? Authorizing Provider  acetaminophen (TYLENOL) 500 MG tablet Take 1 tablet (500 mg total) by mouth every 6 (six) hours as needed (as needed for pain.). 06/02/17  Yes Arrien, Jimmy Picket, MD  ARIPiprazole (ABILIFY) 5 MG tablet Take 1 tablet (5 mg total) by mouth every morning. Patient taking differently: Take 10 mg by mouth daily.  05/18/17  Yes Nita Sells, MD  escitalopram (LEXAPRO) 20 MG  tablet Take 1 tablet (20 mg total) by mouth every morning. Patient taking differently: Take 10 mg by mouth daily.  06/26/17  Yes Tawny Asal, MD  lovastatin (MEVACOR) 20 MG tablet Take 1 tablet (20 mg total) by mouth at bedtime. 05/18/17  Yes Nita Sells, MD  mometasone-formoterol (DULERA) 100-5 MCG/ACT AERO Inhale 2 puffs into the lungs 2 (two) times daily. 05/18/17  Yes Nita Sells, MD  tamsulosin (FLOMAX) 0.4 MG CAPS capsule Take 1 capsule (0.4 mg total) by mouth daily. 06/18/17  Yes Donne Hazel, MD  albuterol (PROVENTIL) (5 MG/ML) 0.5% nebulizer solution Take 1 mL (5 mg total) by nebulization every 6 (six) hours as needed for wheezing or shortness of breath. 08/13/17   Larene Pickett, PA-C  azithromycin (ZITHROMAX Z-PAK) 250 MG tablet Take 500 mg (two tablets) on first day, then one tablet each day until out. Patient not taking: Reported on 07/17/2017 06/26/17   Tawny Asal, MD  ipratropium (ATROVENT) 0.02 % nebulizer solution Take 2.5 mLs (0.5 mg total) by nebulization 4 (four) times daily. 08/13/17   Larene Pickett, PA-C  OXYGEN Inhale 3 L into the lungs.     [provider]  polyethylene glycol (MIRALAX / GLYCOLAX) packet Take 17 g by mouth daily as needed for mild constipation. Patient not taking: Reported on 08/14/2017 06/09/17   Samuella Cota, MD  predniSONE (DELTASONE) 20 MG tablet Take 40 mg by mouth daily for 3 days, then 20mg  by mouth daily for 3 days, then 10mg  daily for 3 days 08/13/17   Larene Pickett, PA-C  tamsulosin (FLOMAX) 0.4 MG CAPS capsule Take 1 capsule (0.4 mg total) by mouth daily. Patient not taking: Reported on 08/13/2017 07/17/17   Palumbo, April, MD  tiotropium (SPIRIVA HANDIHALER) 18 MCG inhalation capsule Place 1 capsule (18 mcg total) into inhaler and inhale daily. Patient not taking: Reported on 08/13/2017 06/26/17   Tawny Asal, MD    Family History Family History  Problem Relation Age of Onset  . Heart disease Father   . Lung  cancer Maternal Uncle        smoked    Social History Social History   Tobacco Use  . Smoking status: Former Smoker    Packs/day: 0.10    Years: 46.00    Pack years: 4.60    Types: Cigarettes    Last attempt to quit: 05/21/2017    Years since quitting: 0.2  . Smokeless tobacco: Former Systems developer    Types: Chew  Substance Use Topics  . Alcohol use: Not Currently    Comment: h/o use in the past, no h/o heavy use  . Drug use: No     Allergies   Codeine   Review of Systems Review of Systems  All systems reviewed and negative, other than as noted in HPI.  Physical Exam Updated Vital Signs BP 128/83   Pulse 80  Temp 98 F (36.7 C) (Oral)   Resp (!) 26   SpO2 96%   Physical Exam  Constitutional: He appears well-developed and well-nourished. No distress.  Laying in bed.  No acute distress.  HENT:  Head: Normocephalic and atraumatic.  Eyes: Conjunctivae are normal. Right eye exhibits no discharge. Left eye exhibits no discharge.  Neck: Neck supple.  Cardiovascular: Normal rate, regular rhythm and normal heart sounds. Exam reveals no gallop and no friction rub.  No murmur heard. Pulmonary/Chest: Effort normal. No respiratory distress. He has wheezes.  Abdominal: Soft. He exhibits no distension. There is no tenderness.  Musculoskeletal: He exhibits no edema or tenderness.  Neurological: He is alert.  Skin: Skin is warm and dry.  Psychiatric: He has a normal mood and affect. His behavior is normal. Thought content normal.  Nursing note and vitals reviewed.    ED Treatments / Results  Labs (all labs ordered are listed, but only abnormal results are displayed) Labs Reviewed  BASIC METABOLIC PANEL - Abnormal; Notable for the following components:      Result Value   CO2 35 (*)    Glucose, Bld 117 (*)    Calcium 8.8 (*)    All other components within normal limits  CBC - Abnormal; Notable for the following components:   RBC 4.21 (*)    Hemoglobin 12.9 (*)    All  other components within normal limits  I-STAT TROPONIN, ED    EKG EKG Interpretation  Date/Time:  Thursday August 14 2017 21:42:09 EDT Ventricular Rate:  84 PR Interval:    QRS Duration: 99 QT Interval:  346 QTC Calculation: 409 R Axis:   71 Text Interpretation:  Sinus rhythm Nonspecific T abnrm, anterolateral leads Borderline ST elevation, lateral leads Confirmed by Virgel Manifold [id number]) on 08/14/2017 10:49:08 PM   Radiology Dg Chest 2 View  Result Date: 08/14/2017 CLINICAL DATA:  Chest pain.  History of COPD. EXAM: CHEST - 2 VIEW COMPARISON:  Chest radiograph August 13, 2017 FINDINGS: Cardiomediastinal silhouette is normal. No pleural effusions or focal consolidations. Similar hyperinflation with flattened hemidiaphragms. Trachea projects midline and there is no pneumothorax. Soft tissue planes and included osseous structures are non-suspicious. Old RIGHT rib fractures. IMPRESSION: COPD.  No focal consolidation. Electronically Signed   By: Elon Alas M.D.   On: 08/14/2017 22:27   Dg Chest 2 View  Result Date: 08/13/2017 CLINICAL DATA:  Increased shortness of breath on exertion, lightheadedness, and chest tightness. Recently started on prednisone. History of COPD. EXAM: CHEST - 2 VIEW COMPARISON:  07/17/2017 FINDINGS: Hyperinflation consistent with emphysema. No airspace disease or consolidation in the lungs. No blunting of costophrenic angles. No pneumothorax. Mediastinal contours appear intact. Normal heart size and pulmonary vascularity. Old right rib fractures. IMPRESSION: Emphysematous changes in the lungs. No evidence of active pulmonary disease. Electronically Signed   By: Lucienne Capers M.D.   On: 08/13/2017 01:25    Procedures Procedures (including critical care time)  Medications Ordered in ED Medications - No data to display   Initial Impression / Assessment and Plan / ED Course  I have reviewed the triage vital signs and the nursing notes.  Pertinent labs &  imaging results that were available during my care of the patient were reviewed by me and considered in my medical decision making (see chart for details).     61yM with dyspnea. He really doesn't look to bad from a respiratory standpoint. Hx of COPD. He is a little tachypneic, but otherwise doesn't his  respiratory exam is fairly unremarkable.  He requested several times that he would like to stay in the hospital and was also requesting something to eat.  I suspect that this is more the reason he was seen in the emergency room.  I doubt emergent condition.  Reassurance was provided and he was given some food.   Final Clinical Impressions(s) / ED Diagnoses   Final diagnoses:  COPD exacerbation [facility])    ED Discharge Orders    None       Virgel Manifold, MD 08/17/17 2332

## 2017-08-15 MED ORDER — ALBUTEROL SULFATE (2.5 MG/3ML) 0.083% IN NEBU
5.0000 mg | INHALATION_SOLUTION | Freq: Once | RESPIRATORY_TRACT | Status: AC
  Administered 2017-08-15: 5 mg via RESPIRATORY_TRACT
  Filled 2017-08-15: qty 6

## 2017-08-15 NOTE — ED Notes (Signed)
Pt ambulatory to the bathroom, short of breath on return with some wheezing noted. Pt reports that he takes albuterol treatments q4hrs @ home.

## 2017-08-15 NOTE — ED Notes (Signed)
Pt discharged to group home independent living at this time. Pt states he has access to home and has been transported in this manner via [facility] in his past discharges from the hospital.

## 2017-08-24 ENCOUNTER — Other Ambulatory Visit: Payer: Self-pay

## 2017-08-24 ENCOUNTER — Encounter (HOSPITAL_COMMUNITY): Payer: Self-pay

## 2017-08-24 ENCOUNTER — Inpatient Hospital Stay (HOSPITAL_COMMUNITY)
Admission: EM | Admit: 2017-08-24 | Discharge: 2017-08-27 | DRG: 192 | Disposition: A | Discharge status: Home / Self Care | Payer: Medicaid Other | Attending: Oncology | Admitting: Oncology

## 2017-08-24 ENCOUNTER — Inpatient Hospital Stay (HOSPITAL_COMMUNITY): Payer: Medicaid Other

## 2017-08-24 ENCOUNTER — Emergency Department (HOSPITAL_COMMUNITY): Payer: Medicaid Other

## 2017-08-24 DIAGNOSIS — D72829 Elevated white blood cell count, unspecified: Secondary | ICD-10-CM | POA: Diagnosis present

## 2017-08-24 DIAGNOSIS — R972 Elevated prostate specific antigen [PSA]: Secondary | ICD-10-CM | POA: Diagnosis not present

## 2017-08-24 DIAGNOSIS — Z87891 Personal history of nicotine dependence: Secondary | ICD-10-CM

## 2017-08-24 DIAGNOSIS — J441 Chronic obstructive pulmonary disease with (acute) exacerbation: Secondary | ICD-10-CM | POA: Diagnosis present

## 2017-08-24 DIAGNOSIS — Z85038 Personal history of other malignant neoplasm of large intestine: Secondary | ICD-10-CM

## 2017-08-24 DIAGNOSIS — R000 Tachycardia, unspecified: Secondary | ICD-10-CM | POA: Diagnosis present

## 2017-08-24 DIAGNOSIS — F419 Anxiety disorder, unspecified: Secondary | ICD-10-CM | POA: Diagnosis present

## 2017-08-24 DIAGNOSIS — Z9221 Personal history of antineoplastic chemotherapy: Secondary | ICD-10-CM | POA: Diagnosis not present

## 2017-08-24 DIAGNOSIS — I959 Hypotension, unspecified: Secondary | ICD-10-CM | POA: Diagnosis present

## 2017-08-24 DIAGNOSIS — Z7951 Long term (current) use of inhaled steroids: Secondary | ICD-10-CM | POA: Diagnosis not present

## 2017-08-24 DIAGNOSIS — Z9114 Patient's other noncompliance with medication regimen: Secondary | ICD-10-CM | POA: Diagnosis not present

## 2017-08-24 DIAGNOSIS — Z720 Tobacco use: Secondary | ICD-10-CM | POA: Diagnosis not present

## 2017-08-24 DIAGNOSIS — R12 Heartburn: Secondary | ICD-10-CM | POA: Diagnosis present

## 2017-08-24 DIAGNOSIS — Z9981 Dependence on supplemental oxygen: Secondary | ICD-10-CM | POA: Diagnosis not present

## 2017-08-24 DIAGNOSIS — F329 Major depressive disorder, single episode, unspecified: Secondary | ICD-10-CM | POA: Diagnosis present

## 2017-08-24 DIAGNOSIS — R251 Tremor, unspecified: Secondary | ICD-10-CM | POA: Diagnosis present

## 2017-08-24 DIAGNOSIS — R258 Other abnormal involuntary movements: Secondary | ICD-10-CM

## 2017-08-24 DIAGNOSIS — I493 Ventricular premature depolarization: Secondary | ICD-10-CM | POA: Diagnosis present

## 2017-08-24 DIAGNOSIS — F319 Bipolar disorder, unspecified: Secondary | ICD-10-CM | POA: Diagnosis present

## 2017-08-24 DIAGNOSIS — Z8249 Family history of ischemic heart disease and other diseases of the circulatory system: Secondary | ICD-10-CM

## 2017-08-24 DIAGNOSIS — C61 Malignant neoplasm of prostate: Secondary | ICD-10-CM

## 2017-08-24 DIAGNOSIS — Z79899 Other long term (current) drug therapy: Secondary | ICD-10-CM | POA: Diagnosis not present

## 2017-08-24 DIAGNOSIS — Z801 Family history of malignant neoplasm of trachea, bronchus and lung: Secondary | ICD-10-CM

## 2017-08-24 DIAGNOSIS — R0603 Acute respiratory distress: Secondary | ICD-10-CM

## 2017-08-24 DIAGNOSIS — Z9889 Other specified postprocedural states: Secondary | ICD-10-CM | POA: Diagnosis not present

## 2017-08-24 DIAGNOSIS — Z885 Allergy status to narcotic agent status: Secondary | ICD-10-CM | POA: Diagnosis not present

## 2017-08-24 DIAGNOSIS — Z7952 Long term (current) use of systemic steroids: Secondary | ICD-10-CM

## 2017-08-24 DIAGNOSIS — Z8701 Personal history of pneumonia (recurrent): Secondary | ICD-10-CM

## 2017-08-24 DIAGNOSIS — F32A Depression, unspecified: Secondary | ICD-10-CM | POA: Diagnosis present

## 2017-08-24 DIAGNOSIS — R0602 Shortness of breath: Secondary | ICD-10-CM | POA: Diagnosis present

## 2017-08-24 LAB — CBC WITH DIFFERENTIAL/PLATELET
ABS IMMATURE GRANULOCYTES: 0.2 10*3/uL — AB (ref 0.0–0.1)
BASOS ABS: 0 10*3/uL (ref 0.0–0.1)
Basophils Relative: 0 %
Eosinophils Absolute: 0 10*3/uL (ref 0.0–0.7)
Eosinophils Relative: 0 %
HEMATOCRIT: 46.1 % (ref 39.0–52.0)
HEMOGLOBIN: 14.4 g/dL (ref 13.0–17.0)
IMMATURE GRANULOCYTES: 2 %
LYMPHS ABS: 1.3 10*3/uL (ref 0.7–4.0)
LYMPHS PCT: 12 %
MCH: 30.8 pg (ref 26.0–34.0)
MCHC: 31.2 g/dL (ref 30.0–36.0)
MCV: 98.7 fL (ref 78.0–100.0)
Monocytes Absolute: 0.8 10*3/uL (ref 0.1–1.0)
Monocytes Relative: 7 %
NEUTROS ABS: 8.8 10*3/uL — AB (ref 1.7–7.7)
NEUTROS PCT: 79 %
Platelets: 200 10*3/uL (ref 150–400)
RBC: 4.67 MIL/uL (ref 4.22–5.81)
RDW: 12.6 % (ref 11.5–15.5)
WBC: 11.1 10*3/uL — AB (ref 4.0–10.5)

## 2017-08-24 LAB — COMPREHENSIVE METABOLIC PANEL
ALT: 23 U/L (ref 0–44)
ANION GAP: 10 (ref 5–15)
AST: 19 U/L (ref 15–41)
Albumin: 3.7 g/dL (ref 3.5–5.0)
Alkaline Phosphatase: 58 U/L (ref 38–126)
BUN: 12 mg/dL (ref 8–23)
CHLORIDE: 94 mmol/L — AB (ref 98–111)
CO2: 31 mmol/L (ref 22–32)
Calcium: 8.5 mg/dL — ABNORMAL LOW (ref 8.9–10.3)
Creatinine, Ser: 0.96 mg/dL (ref 0.61–1.24)
Glucose, Bld: 232 mg/dL — ABNORMAL HIGH (ref 70–99)
POTASSIUM: 4.2 mmol/L (ref 3.5–5.1)
Sodium: 135 mmol/L (ref 135–145)
TOTAL PROTEIN: 5.9 g/dL — AB (ref 6.5–8.1)
Total Bilirubin: 0.6 mg/dL (ref 0.3–1.2)

## 2017-08-24 LAB — BRAIN NATRIURETIC PEPTIDE: B NATRIURETIC PEPTIDE 5: 34.7 pg/mL (ref 0.0–100.0)

## 2017-08-24 LAB — I-STAT TROPONIN, ED: Troponin i, poc: 0.01 ng/mL (ref 0.00–0.08)

## 2017-08-24 LAB — D-DIMER, QUANTITATIVE: D-Dimer, Quant: 0.64 ug/mL-FEU — ABNORMAL HIGH (ref 0.00–0.50)

## 2017-08-24 MED ORDER — MOMETASONE FURO-FORMOTEROL FUM 100-5 MCG/ACT IN AERO
2.0000 | INHALATION_SPRAY | Freq: Two times a day (BID) | RESPIRATORY_TRACT | Status: DC
  Administered 2017-08-25 – 2017-08-26 (×4): 2 via RESPIRATORY_TRACT
  Filled 2017-08-24: qty 8.8

## 2017-08-24 MED ORDER — PRAVASTATIN SODIUM 20 MG PO TABS
20.0000 mg | ORAL_TABLET | Freq: Every day | ORAL | Status: DC
  Administered 2017-08-25 – 2017-08-26 (×2): 20 mg via ORAL
  Filled 2017-08-24 (×3): qty 1

## 2017-08-24 MED ORDER — ARIPIPRAZOLE 5 MG PO TABS
5.0000 mg | ORAL_TABLET | Freq: Every day | ORAL | Status: DC
  Administered 2017-08-25 – 2017-08-26 (×2): 5 mg via ORAL
  Filled 2017-08-24 (×2): qty 1

## 2017-08-24 MED ORDER — ACETAMINOPHEN 650 MG RE SUPP: 650.0000 mg | Freq: Four times a day (QID) | RECTAL | Status: DC | PRN

## 2017-08-24 MED ORDER — ACETAMINOPHEN 325 MG PO TABS
650.0000 mg | ORAL_TABLET | Freq: Four times a day (QID) | ORAL | Status: DC | PRN
  Administered 2017-08-26: 650 mg via ORAL
  Filled 2017-08-24: qty 2

## 2017-08-24 MED ORDER — ALBUTEROL (5 MG/ML) CONTINUOUS INHALATION SOLN
10.0000 mg/h | INHALATION_SOLUTION | Freq: Once | RESPIRATORY_TRACT | Status: AC
  Administered 2017-08-24: 10 mg/h via RESPIRATORY_TRACT
  Filled 2017-08-24: qty 20

## 2017-08-24 MED ORDER — IPRATROPIUM BROMIDE 0.02 % IN SOLN
0.5000 mg | Freq: Once | RESPIRATORY_TRACT | Status: AC
  Administered 2017-08-24: 0.5 mg via RESPIRATORY_TRACT
  Filled 2017-08-24: qty 2.5

## 2017-08-24 MED ORDER — IOPAMIDOL (ISOVUE-370) INJECTION 76%
100.0000 mL | Freq: Once | INTRAVENOUS | Status: AC | PRN
  Administered 2017-08-24: 100 mL via INTRAVENOUS

## 2017-08-24 MED ORDER — SODIUM CHLORIDE 0.9 % IV SOLN
INTRAVENOUS | Status: DC
  Administered 2017-08-25 – 2017-08-26 (×3): via INTRAVENOUS

## 2017-08-24 MED ORDER — ENOXAPARIN SODIUM 40 MG/0.4ML ~~LOC~~ SOLN
40.0000 mg | Freq: Every day | SUBCUTANEOUS | Status: DC
  Administered 2017-08-25 – 2017-08-26 (×2): 40 mg via SUBCUTANEOUS
  Filled 2017-08-24 (×2): qty 0.4

## 2017-08-24 MED ORDER — SODIUM CHLORIDE 0.9 % IV BOLUS
1000.0000 mL | Freq: Once | INTRAVENOUS | Status: AC
  Administered 2017-08-24: 1000 mL via INTRAVENOUS

## 2017-08-24 MED ORDER — ESCITALOPRAM OXALATE 10 MG PO TABS
10.0000 mg | ORAL_TABLET | Freq: Every day | ORAL | Status: DC
  Administered 2017-08-25 – 2017-08-26 (×2): 10 mg via ORAL
  Filled 2017-08-24 (×2): qty 1

## 2017-08-24 MED ORDER — PREDNISONE 20 MG PO TABS
40.0000 mg | ORAL_TABLET | Freq: Every day | ORAL | Status: DC
  Administered 2017-08-25 – 2017-08-26 (×2): 40 mg via ORAL
  Filled 2017-08-24 (×2): qty 2

## 2017-08-24 MED ORDER — FOLIC ACID 1 MG PO TABS
1.0000 mg | ORAL_TABLET | Freq: Every day | ORAL | Status: DC
  Administered 2017-08-25 – 2017-08-26 (×2): 1 mg via ORAL
  Filled 2017-08-24 (×2): qty 1

## 2017-08-24 MED ORDER — ADULT MULTIVITAMIN W/MINERALS CH
1.0000 | ORAL_TABLET | Freq: Every day | ORAL | Status: DC
  Administered 2017-08-25 – 2017-08-26 (×2): 1 via ORAL
  Filled 2017-08-24 (×2): qty 1

## 2017-08-24 MED ORDER — LEVALBUTEROL HCL 0.63 MG/3ML IN NEBU: 0.6300 mg | INHALATION_SOLUTION | Freq: Four times a day (QID) | RESPIRATORY_TRACT | Status: DC | PRN

## 2017-08-24 MED ORDER — VITAMIN B-1 100 MG PO TABS
100.0000 mg | ORAL_TABLET | Freq: Every day | ORAL | Status: DC
  Administered 2017-08-25 – 2017-08-26 (×2): 100 mg via ORAL
  Filled 2017-08-24 (×2): qty 1

## 2017-08-24 MED ORDER — SENNOSIDES-DOCUSATE SODIUM 8.6-50 MG PO TABS: 1.0000 | ORAL_TABLET | Freq: Every evening | ORAL | Status: DC | PRN

## 2017-08-24 MED ORDER — TAMSULOSIN HCL 0.4 MG PO CAPS
0.4000 mg | ORAL_CAPSULE | Freq: Every day | ORAL | Status: DC
  Administered 2017-08-25 – 2017-08-26 (×2): 0.4 mg via ORAL
  Filled 2017-08-24 (×2): qty 1

## 2017-08-24 MED ORDER — IPRATROPIUM BROMIDE 0.02 % IN SOLN
0.5000 mg | Freq: Four times a day (QID) | RESPIRATORY_TRACT | Status: DC
  Administered 2017-08-25 – 2017-08-26 (×8): 0.5 mg via RESPIRATORY_TRACT
  Filled 2017-08-24 (×7): qty 2.5

## 2017-08-24 NOTE — ED Triage Notes (Signed)
Pt brought in by [facility] from home for respiratory distress that started today. Pt has hx of COPD, asthma, and respiratory distress. Per EMS pt wheezing in all lung fields. Pt given 10mg  albuterol and 0.5mg  atrovent, 2mg  mag, and 125 solumedrol PTA. Pt uses 4L O2 at home at all times. Pt A+Ox4. Pt switched to bipap on arrival to ED.

## 2017-08-24 NOTE — ED Notes (Signed)
Called lab- stated they can add on d-dimer

## 2017-08-24 NOTE — ED Provider Notes (Addendum)
[facility] EMERGENCY DEPARTMENT Provider Note   CSN: [id number] Arrival date & time: 08/24/17  1758     History   Chief Complaint Chief Complaint  Patient presents with  . Respiratory Distress    HPI Taylor [name] is a 61 y.o. male.  HPI   Taylor [name] is a 62 y.o. male, with a history of asthma, COPD, presenting to the ED with shortness of breath worsening since yesterday. He has been using his albuterol inhaler without improvement. Patient is chronically on 4 L supplemental O2. EMS reports global wheezing along with respiratory distress on their initial exam.  Patient received 10 mg albuterol, 0.5 mg Atrovent, 2 mg magnesium, and 125 mg Solu-Medrol.  They also placed the patient on CPAP and patient showed improvement. Patient denies fever/chills, chest pain, N/V/D, abdominal pain, peripheral edema, or any other complaints.     Past Medical History:  Diagnosis Date  . Asthma   . Bipolar 1 disorder ([place]) 02/05/2012  . Colon cancer ([place]) 04/11/11   adenocarcinoma of colon, 7/19 nodes pos.FINISHED CHEMO/DR. SHERRILL  . COPD (chronic obstructive pulmonary disease) ([place])    SMOKER  . Depression   . Emphysema of lung ([place])   . Full dentures   . Hemorrhoids   . On home oxygen therapy    "3L; 24/7" (05/29/2017)  . Pneumonia ~ 2016   "double pneumonia"  . Prostate cancer ([place])    Gleason score = 7, supposed to have radiation therapy but he has not followed up (05/29/2017)  . Rib fractures    hx of    Patient Active Problem List   Diagnosis Date Noted  . Malnutrition of moderate degree 06/14/2017  . Hyperglycemia 06/08/2017  . Constipation 06/08/2017  . SIRS (systemic inflammatory response syndrome) ([place]) 05/21/2017  . Tobacco abuse 05/13/2017  . HLD (hyperlipidemia) 05/13/2017  . Leukocytosis 04/06/2017  . Adjustment disorder with depressed mood 03/28/2017  . Adjustment disorder with mixed disturbance of emotions and conduct   . Chronic  respiratory failure with hypoxia ([place]) 03/24/2017  . Normocytic normochromic anemia 03/24/2017  . COPD with acute exacerbation ([place]) 10/22/2016  . Alcohol abuse 01/09/2013  . COPD exacerbation ([place]) 01/09/2013  . Chest pain 01/09/2013  . Smoker 12/16/2012  . Inguinal hernia unilateral, non-recurrent, right 05/01/2012  . Dyspepsia 02/05/2012  . Bipolar 1 disorder ([place]) 02/05/2012  . 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 COPD  GOLD III 02/03/2012  . Thrombocytopenia ([place]) 02/03/2012  . Depression   . 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 COLON SURGERY  04/11/11   Sigmoid colectomy  . COLOSTOMY REVISION  04/11/2011   Procedure: COLON RESECTION SIGMOID;  Surgeon: [name], MD;  Location: [facility];  Service: General;  Laterality: N/A;  low anterior colon resection   . HERNIA REPAIR    . INGUINAL HERNIA REPAIR Right 05/01/2012   Procedure: HERNIA REPAIR INGUINAL ADULT;  Surgeon: [name], MD;  Location: [facility];  Service: General;  Laterality: Right;  . INSERTION OF MESH Right 05/01/2012   Procedure: INSERTION OF MESH;  Surgeon: [name], MD;  Location: [facility];  Service: General;  Laterality: Right;  . PORT-A-CATH REMOVAL Left 05/01/2012   Procedure: REMOVAL Infusion Port;  Surgeon: [name], MD;  Location: [facility];  Service: General;  Laterality: Left;  . PORTACATH PLACEMENT  05/02/2011   Procedure: INSERTION PORT-A-CATH;  Surgeon: [name], MD;  Location: [facility];  Service: General;  Laterality: N/A;  . PROSTATE BIOPSY          Home Medications    Prior to Admission medications   Medication Sig Start Date End Date Taking? Authorizing Provider  acetaminophen (TYLENOL) 500 MG tablet Take 1 tablet (500 mg total) by mouth every 6 (six) hours as needed (as needed for pain.). 06/02/17   Arrien, Jimmy Picket, MD  albuterol (PROVENTIL) (5 MG/ML) 0.5% nebulizer solution Take 1 mL (5 mg total) by  nebulization every 6 (six) hours as needed for wheezing or shortness of breath. 08/13/17   Larene Pickett, PA-C  ARIPiprazole (ABILIFY) 5 MG tablet Take 1 tablet (5 mg total) by mouth every morning. Patient taking differently: Take 10 mg by mouth daily.  05/18/17   Nita Sells, MD  azithromycin (ZITHROMAX Z-PAK) 250 MG tablet Take 500 mg (two tablets) on first day, then one tablet each day until out. Patient not taking: Reported on 07/17/2017 06/26/17   Tawny Asal, MD  escitalopram (LEXAPRO) 20 MG tablet Take 1 tablet (20 mg total) by mouth every morning. Patient taking differently: Take 10 mg by mouth daily.  06/26/17   Tawny Asal, MD  ipratropium (ATROVENT) 0.02 % nebulizer solution Take 2.5 mLs (0.5 mg total) by nebulization 4 (four) times daily. 08/13/17   Larene Pickett, PA-C  lovastatin (MEVACOR) 20 MG tablet Take 1 tablet (20 mg total) by mouth at bedtime. 05/18/17   Nita Sells, MD  mometasone-formoterol (DULERA) 100-5 MCG/ACT AERO Inhale 2 puffs into the lungs 2 (two) times daily. 05/18/17   Nita Sells, MD  OXYGEN Inhale 3 L into the lungs.     [provider]  polyethylene glycol (MIRALAX / GLYCOLAX) packet Take 17 g by mouth daily as needed for mild constipation. Patient not taking: Reported on 08/14/2017 06/09/17   Samuella Cota, MD  predniSONE (DELTASONE) 20 MG tablet Take 40 mg by mouth daily for 3 days, then 20mg  by mouth daily for 3 days, then 10mg  daily for 3 days 08/13/17   Larene Pickett, PA-C  tamsulosin (FLOMAX) 0.4 MG CAPS capsule Take 1 capsule (0.4 mg total) by mouth daily. 06/18/17   Donne Hazel, MD  tamsulosin (FLOMAX) 0.4 MG CAPS capsule Take 1 capsule (0.4 mg total) by mouth daily. Patient not taking: Reported on 08/13/2017 07/17/17   Palumbo, April, MD  tiotropium (SPIRIVA HANDIHALER) 18 MCG inhalation capsule Place 1 capsule (18 mcg total) into inhaler and inhale daily. Patient not taking: Reported on 08/13/2017 06/26/17   Tawny Asal, MD    Family History Family History  Problem Relation Age of Onset  . Heart disease Father   . Lung cancer Maternal Uncle        smoked    Social History Social History   Tobacco Use  . Smoking status: Former Smoker    Packs/day: 0.10    Years: 46.00    Pack years: 4.60    Types: Cigarettes    Last attempt to quit: 05/21/2017    Years since quitting: 0.2  . Smokeless tobacco: Former Systems developer    Types: Chew  Substance Use Topics  . Alcohol use: Not Currently    Comment: h/o use in the past, no h/o heavy use  . Drug use: No     Allergies   Codeine   Review of Systems Review of Systems  Constitutional: Negative for chills, diaphoresis and fever.  Respiratory: Positive for shortness of breath. Negative for  cough.   Cardiovascular: Negative for chest pain and leg swelling.  Gastrointestinal: Negative for abdominal pain, diarrhea, nausea and vomiting.  All other systems reviewed and are negative.    Physical Exam Updated Vital Signs BP 107/80   Pulse 75   Temp 97.8 F (36.6 C) (Axillary)   Resp (!) 24   Ht 5\' 11"  (1.803 m)   Wt 72.6 kg (160 lb)   SpO2 97%   BMI 22.32 kg/m   Physical Exam  Constitutional: He appears well-developed and well-nourished. No distress.  HENT:  Head: Normocephalic and atraumatic.  Eyes: Conjunctivae are normal.  Neck: Neck supple.  Cardiovascular: Regular rhythm, normal heart sounds and intact distal pulses. Tachycardia present.  Pulmonary/Chest: Accessory muscle usage present. He is in respiratory distress. He has decreased breath sounds.  Patient still shows some increased work of breathing upon arrival with CPAP.  Showed significant improvement and increased apparent comfort when switched to BiPAP here in the ED. Patient was then able to speak in complete phrases.  Abdominal: Soft. There is no tenderness. There is no guarding.  Musculoskeletal: He exhibits no edema.  Lymphadenopathy:    He has no cervical adenopathy.    Neurological: He is alert.  Skin: Skin is warm and dry. He is not diaphoretic.  Psychiatric: He has a normal mood and affect. His behavior is normal.  Nursing note and vitals reviewed.    ED Treatments / Results  Labs (all labs ordered are listed, but only abnormal results are displayed) Labs Reviewed  COMPREHENSIVE METABOLIC PANEL - Abnormal; Notable for the following components:      Result Value   Chloride 94 (*)    Glucose, Bld 232 (*)    Calcium 8.5 (*)    Total Protein 5.9 (*)    All other components within normal limits  CBC WITH DIFFERENTIAL/PLATELET - Abnormal; Notable for the following components:   WBC 11.1 (*)    Neutro Abs 8.8 (*)    Abs Immature Granulocytes 0.2 (*)    All other components within normal limits  BRAIN NATRIURETIC PEPTIDE  I-STAT TROPONIN, ED    EKG None  Radiology Dg Chest Port 1 View  Result Date: 08/24/2017 CLINICAL DATA:  Shortness of breath today, history COPD, asthma, pneumonia, prostate cancer, former smoker EXAM: PORTABLE CHEST 1 VIEW COMPARISON:  Portable exam 1822 hours compared to 08/14/2017 FINDINGS: Normal heart size, mediastinal contours, and pulmonary vascularity. Lungs emphysematous but clear. No pulmonary infiltrate, pleural effusion or pneumothorax. Bones demineralized with old healed fractures of the posterior RIGHT third fourth and fifth ribs. IMPRESSION: COPD changes without acute infiltrate. Electronically Signed   By: Lavonia Dana M.D.   On: 08/24/2017 18:38    Procedures .Critical Care Performed by: Lorayne Bender, PA-C Authorized by: Lorayne Bender, PA-C   Critical care provider statement:    Critical care time (minutes):  45   Critical care time was exclusive of:  Separately billable procedures and treating other patients   Critical care was necessary to treat or prevent imminent or life-threatening deterioration of the following conditions:  Respiratory failure   Critical care was time spent personally by me on the  following activities:  Development of treatment plan with patient or surrogate, discussions with consultants, evaluation of patient's response to treatment, examination of patient, obtaining history from patient or surrogate, review of old charts, re-evaluation of patient's condition, pulse oximetry, ordering and review of radiographic studies, ordering and review of laboratory studies and ordering and performing treatments  and interventions   I assumed direction of critical care for this patient from another provider in my specialty: no     (including critical care time)  Medications Ordered in ED Medications  albuterol (PROVENTIL,VENTOLIN) solution continuous neb (10 mg/hr Nebulization Given 08/24/17 1822)  ipratropium (ATROVENT) nebulizer solution 0.5 mg (0.5 mg Nebulization Given 08/24/17 1822)  sodium chloride 0.9 % bolus 1,000 mL (0 mLs Intravenous Stopped 08/24/17 2003)     Initial Impression / Assessment and Plan / ED Course  I have reviewed the triage vital signs and the nursing notes.  Pertinent labs & imaging results that were available during my care of the patient were reviewed by me and considered in my medical decision making (see chart for details).  Clinical Course as of Aug 24 2033  Nancy Fetter Aug 24, 2017  2034 Spoke with IM resident. States they will assess and admit the patient.    [SJ]    Clinical Course User Index [SJ] Joy, Shawn C, PA-C    Patient presents with shortness of breath beginning yesterday.  Patient initially in extremis with EMS, improved somewhat in route to the hospital, but still required resuscitation upon arrival.  Initial hypotension and tachycardia improved with IV fluid bolus.  Shortness of breath improved with continued BiPAP and continuous albuterol nebulizer.  Findings and plan of care discussed with Charlesetta Shanks, MD. Dr. Johnney Killian personally evaluated and examined this patient.  Vitals:   08/24/17 1830 08/24/17 1845 08/24/17 1900 08/24/17 1915  BP:  105/85 113/80 109/70 102/82  Pulse: (!) 128 100 (!) 135 77  Resp: (!) 28 (!) 34 (!) 27 (!) 24  Temp:      TempSrc:      SpO2: 100% 100% 100% 100%  Weight:      Height:       Vitals:   08/24/17 1915 08/24/17 1930 08/24/17 1945 08/24/17 2000  BP: 102/82 118/75 112/72 107/80  Pulse: 77 (!) 101 96 75  Resp: (!) 24 (!) 25 (!) 22 (!) 24  Temp:      TempSrc:      SpO2: 100% 99% 98% 97%  Weight:      Height:         Final Clinical Impressions(s) / ED Diagnoses   Final diagnoses:  COPD exacerbation [facility])    ED Discharge Orders    None       Layla Maw 08/24/17 2035    Charlesetta Shanks, MD 08/25/17 2138  Critical care documentation added.   Lorayne Bender, PA-C 09/10/17 1135    Charlesetta Shanks, MD 09/12/17 [phone]

## 2017-08-24 NOTE — Progress Notes (Signed)
Pt transported while on BIPAP to CT1 and back to E39 without incidence.

## 2017-08-24 NOTE — H&P (Addendum)
Date: 08/24/2017               Patient Name:  Taylor [name] MRN: [id number]  DOB: Dec 15, 1955 Age / Sex: 62 y.o., male   PCP: [facility]         Medical Service: Internal Medicine Teaching Service         Attending Physician: Dr. Beryle Beams    First Contact: Dr. Francesco Runner Pager: [phone]  Second Contact: Dr. Ledell Noss Pager: [phone]       After Hours (After 5p/  First Contact Pager: [phone]  weekends / holidays): Second Contact Pager: [phone]   Chief Complaint: Shortness of breath  History of Present Illness: [name] is a 62 year old male with past medical history of sever COPD (on 3li nasal O2 at home, GOLD stage III), bipolar disease,  , prostate cancer (with no management), Previous Hx. Of colon cancer (s/p surgery 2013)presented with worsening shortness of breath since yesterday.  Patient felt short of breath since yesterday morning, started when he woke up. It worsened by activity and improved with resting. How ever during the day, it got worse and was not improved with using his Albuterol inhaler. He has been on PO Prednisone since last admission 11 days ago. started with 40 mg QD on 7/25, and tapered down. Having is symptoms worsened, he took 40 mg PO Prednison. Patient also reports some minor dry cough yesterday. No measured fever but he felt hot and had sweating with no chills. (he states that since on Prednisone, he feels hot some times). No nausea, vomiting, dizziness, palpitation or chest pain. How ever reports some constant heart burn during the day yesterday. He denies any leg pain or swelling. Usually sleeps on his side due to difficulty breathing in flat position. This has not been the same with no change over last week. He went to bed last night and slept well, but woke up this AM with more difficulty breathing. He lives in shelter, EMS was called. They found him in respiratory distress, he received 10 mg albuterol, 0.5 mg Atrovent, 2  mg magnesium and 125 mg Solu-Medrol. He was also placed on CPAP and made some improvement. When transfered to emergency department he was tachycardic and hypotensive, was placed on BiPAP and got continuous albuterol nebulizer. His respiratory distressed improved significantly but still dependent on BiPAP. He was admitted to internal medicine teaching service for further management. Patient had difficulty obtaining his medications due to social situations. He has had multiple ED visits including July 23 and 25th, due to shortness of breath.   Meds: Current Meds  Medication Sig  . acetaminophen (TYLENOL) 500 MG tablet Take 1 tablet (500 mg total) by mouth every 6 (six) hours as needed (as needed for pain.).  Marland Kitchen albuterol (PROVENTIL) (5 MG/ML) 0.5% nebulizer solution Take 1 mL (5 mg total) by nebulization every 6 (six) hours as needed for wheezing or shortness of breath.  . ARIPiprazole (ABILIFY) 5 MG tablet Take 1 tablet (5 mg total) by mouth every morning. (Patient taking differently: Take 10 mg by mouth daily. )  . escitalopram (LEXAPRO) 20 MG tablet Take 1 tablet (20 mg total) by mouth every morning. (Patient taking differently: Take 10 mg by mouth daily. )  . lovastatin (MEVACOR) 20 MG tablet Take 1 tablet (20 mg total) by mouth at bedtime.  . mometasone-formoterol (DULERA) 100-5 MCG/ACT AERO Inhale 2 puffs into the lungs 2 (two) times daily.  . OXYGEN Inhale 3  L into the lungs.   . predniSONE (DELTASONE) 20 MG tablet Take 40 mg by mouth daily for 3 days, then 20mg  by mouth daily for 3 days, then 10mg  daily for 3 days (Patient taking differently: Take 40 mg by mouth daily. )  . tamsulosin (FLOMAX) 0.4 MG CAPS capsule Take 1 capsule (0.4 mg total) by mouth daily.     Allergies: Allergies as of 08/24/2017 - Review Complete 08/24/2017  Allergen Reaction Noted  . Codeine Nausea And Vomiting 04/02/2011   Past Medical History:  Diagnosis Date  . Asthma   . Bipolar 1 disorder ([place]) 02/05/2012  .  Colon cancer ([place]) 04/11/11   adenocarcinoma of colon, 7/19 nodes pos.FINISHED CHEMO/DR. SHERRILL  . COPD (chronic obstructive pulmonary disease) ([place])    SMOKER  . Depression   . Emphysema of lung ([place])   . Full dentures   . Hemorrhoids   . On home oxygen therapy    "3L; 24/7" (05/29/2017)  . Pneumonia ~ 2016   "double pneumonia"  . Prostate cancer ([place])    Gleason score = 7, supposed to have radiation therapy but he has not followed up (05/29/2017)  . Rib fractures    hx of    Family History: CHF in father, heart disease inmother, lung cancer in uncle Social History: Former heavy smoker (life long 1pack/day), quit couple of years ago, smoke 1 cigarette per day currently. No drug use Alcohol use  Review of Systems: A complete ROS was negative except as per HPI.   Physical Exam: Blood pressure 115/81, pulse (!) 124, temperature 97.8 F (36.6 C), temperature source Axillary, resp. rate (!) 30, height 5\' 11"  (1.803 m), weight 160 lb (72.6 kg), SpO2 97 %.   Physical Exam  Constitutional: No distress. On BiPAP Neck: No JVD present.  Cardiovascular: Tachycardic, Heart sounds are distant . No murmur heard. Pulmonary/Chest: Effort normal. No respiratory distress on BiPAP. He has decreased breath sounds. Anterior lung exam revezls some wheezing  Abdominal: Soft. He exhibits no distension. Bowel sounds are decreased. There is no tenderness.  Extremities: Mild clubbing?, No lower extremities edema, pulses normal bilaterally   CBC Latest Ref Rng & Units 08/24/2017 08/14/2017 08/13/2017  WBC 4.0 - 10.5 K/uL 11.1(H) 9.0 7.3  Hemoglobin 13.0 - 17.0 g/dL 14.4 12.9(L) 12.8(L)  Hematocrit 39.0 - 52.0 % 46.1 40.6 40.4  Platelets 150 - 400 K/uL 200 224 209   CMP Latest Ref Rng & Units 08/24/2017 08/14/2017 08/13/2017  Glucose 70 - 99 mg/dL 232(H) 117(H) 120(H)  BUN 8 - 23 mg/dL 12 23 20   Creatinine 0.61 - 1.24 mg/dL 0.96 0.81 0.76  Sodium 135 - 145 mmol/L 135 143 141  Potassium 3.5 - 5.1 mmol/L  4.2 4.2 4.2  Chloride 98 - 111 mmol/L 94(L) 101 101  CO2 22 - 32 mmol/L 31 35(H) 33(H)  Calcium 8.9 - 10.3 mg/dL 8.5(L) 8.8(L) 8.6(L)  Total Protein 6.5 - 8.1 g/dL 5.9(L) - -  Total Bilirubin 0.3 - 1.2 mg/dL 0.6 - -  Alkaline Phos 38 - 126 U/L 58 - -  AST 15 - 41 U/L 19 - -  ALT 0 - 44 U/L 23 - -  BMP: 34.7 CMP Latest Ref Rng & Units 08/24/2017 08/14/2017 08/13/2017  Glucose 70 - 99 mg/dL 232(H) 117(H) 120(H)  BUN 8 - 23 mg/dL 12 23 20   Creatinine 0.61 - 1.24 mg/dL 0.96 0.81 0.76  Sodium 135 - 145 mmol/L 135 143 141  Potassium 3.5 - 5.1 mmol/L 4.2 4.2  4.2  Chloride 98 - 111 mmol/L 94(L) 101 101  CO2 22 - 32 mmol/L 31 35(H) 33(H)  Calcium 8.9 - 10.3 mg/dL 8.5(L) 8.8(L) 8.6(L)  Total Protein 6.5 - 8.1 g/dL 5.9(L) - -  Total Bilirubin 0.3 - 1.2 mg/dL 0.6 - -  Alkaline Phos 38 - 126 U/L 58 - -  AST 15 - 41 U/L 19 - -  ALT 0 - 44 U/L 23 - -    Ref Range & Units 1d ago 41mo ago  D-Dimer, Quant 0.00 - 0.50 ug/mL-FEU 0.64High   0.48 CM   CXR: personally reviewed my interpretation is, AP CXR, does not show acute cardiopulmonary change and not concerning for COPD. PA and Lat view may be needed  Assessment & Plan by Problem: Active Problems:   Depression   Bipolar 1 disorder (HCC)   COPD with acute exacerbation [facility])  Mr. Piatek is a 62 year old male with past medical history of COPD (on 3li nasal O2 at home), bipolar disease,  Previous Hx. Of colon cancer (s/p chemotherapy and  surgery 2013), prostate cancer (Diagnosed 1 year ago, with no management) presented with worsening shortness of breath since yesterday.    1) Worsening shortness of breath since yesterday, with respiratory distress on EMS arrival:  Has known Hx of COPD, on 3 li nasal O2 at home. (Last spirometry on 2015, Very severe obstruction, with low vital capacity. FEV1 1.18 (32%) Ratio 40 And no change p saba DLCO 50 corrects to 68%. GOLD III   Recent ED visits due to shortness of breath. Probable non compliance to  medications due to social problems.  Likely COPD exacerbation  How ever no Hx of productive cough or increased sputum. Afebrile with mild leucocytosis. AP CXR did not showed pneumonia, secondary to medications non compliance. Responded to BiPAP and continues Albuterol nebulizer.  No chest pain, just had continuous heartburn. Troponin negative. BNP normal.   Low risk for PE based on Wells criteria. Has Hx of colon and prostate cancer with no management. To ruling out PE, D-dimer checked and was mildly positive-->CT angio negative  -Continue BPAP -Continue Atrovent nebulizer 0.5 mg qh6 -Dulera 2 puff BID -Levabuterol nebulizer 0.63 mg q6h PRN -Prednison tab 40 mg QD for 6 doses -Recommend complete smoking cessation -Social worker consult to enhance patient for obtaining his medication  -No antibiotic at this time as he classifies at mild COPD exacerbation plus no positive finding recommending underlying pulmonary infections -May consider discharge with PPI as his frequent noninfective exacerbations might be associated with GERD  2) Tachycardia and Hypotension : Asymptomatic. Rhythm Seems regular and sinus on cardiac monitor. (No EKG is done yet) S/p 1000 ml bolus NaCl, SBP improved to >100  Likely secondary to continus nebulizer today.  -EKG -Continue cardaiac monitoring -Continue 75 mg/h NaCl   3) Prostate cancer: Per patient, he was diagnosed 1 year ago when [facility] was [facility]. Gleason score of 7. He was recommended to get radiation but it never happened. Currently no obstructive or other complaints  -Continue Flomax 0.4 mg QD -Make sure to follow up outpatient with urology  for treatment plan  4) Bipolar disease: Stable -Continue Abilify and Escitalopram   Diet: NPO while on BiPAP Code: DNI Fluids: 53ml/h DVT PPX: Enoxaparin   Dispo: Admit patient to Inpatient with expected length of stay greater than 2 midnights.  SignedDewayne Hatch, MD 08/24/2017, 10:55 PM    Pager: [phone]

## 2017-08-24 NOTE — ED Provider Notes (Signed)
Medical screening examination/treatment/procedure(s) were conducted as a shared visit with non-physician practitioner(s) and myself.  I personally evaluated the patient during the encounter.  None Has known history of COPD.  Shortness of breath has been worsening since yesterday.  He is on chronic O2.  On EMS arrival patient was in distress.  He was placed on CPAP.  Had improvement.  Patient reports he feels much better since being placed on BiPAP and getting continuous albuterol in the emergency department.  Patient is alert and appropriate.  He is speaking in short full sentences through the BiPAP.  Sounds are globally decreased.  Heart is regular borderline tachycardic.  She has had significant improvement on BiPAP but is still dependent for respiratory distress.  I agree with plan of management.   Charlesetta Shanks, MD 08/24/17 [phone]

## 2017-08-24 NOTE — Progress Notes (Signed)
Pt states his breathing has improved. Pt taken off BIPAP at this time, and placed on 3L [place] (home reg). Pt has improved aeration with clear/diminished bbs. Pt tolerating being off BIPAP at this time. RT advised pt to notify RN if he started having any SOB. RT will continue to monitor.

## 2017-08-25 ENCOUNTER — Ambulatory Visit: Payer: Self-pay | Admitting: Internal Medicine

## 2017-08-25 ENCOUNTER — Other Ambulatory Visit: Payer: Self-pay

## 2017-08-25 ENCOUNTER — Encounter (HOSPITAL_COMMUNITY): Payer: Self-pay

## 2017-08-25 DIAGNOSIS — R972 Elevated prostate specific antigen [PSA]: Secondary | ICD-10-CM

## 2017-08-25 DIAGNOSIS — Z9889 Other specified postprocedural states: Secondary | ICD-10-CM

## 2017-08-25 DIAGNOSIS — R251 Tremor, unspecified: Secondary | ICD-10-CM

## 2017-08-25 DIAGNOSIS — F419 Anxiety disorder, unspecified: Secondary | ICD-10-CM

## 2017-08-25 LAB — BASIC METABOLIC PANEL
Anion gap: 9 (ref 5–15)
BUN: 15 mg/dL (ref 8–23)
CO2: 32 mmol/L (ref 22–32)
CREATININE: 0.94 mg/dL (ref 0.61–1.24)
Calcium: 8.4 mg/dL — ABNORMAL LOW (ref 8.9–10.3)
Chloride: 99 mmol/L (ref 98–111)
GFR calc Af Amer: >60 mL/min (ref >60)
Glucose, Bld: 228 mg/dL — ABNORMAL HIGH (ref 70–99)
Potassium: 4.5 mmol/L (ref 3.5–5.1)
Sodium: 140 mmol/L (ref 135–145)

## 2017-08-25 LAB — CBC
HCT: 41.3 % (ref 39.0–52.0)
Hemoglobin: 12.8 g/dL — ABNORMAL LOW (ref 13.0–17.0)
MCH: 30.2 pg (ref 26.0–34.0)
MCHC: 31 g/dL (ref 30.0–36.0)
MCV: 97.4 fL (ref 78.0–100.0)
PLATELETS: 174 10*3/uL (ref 150–400)
RBC: 4.24 MIL/uL (ref 4.22–5.81)
RDW: 12.6 % (ref 11.5–15.5)
WBC: 7.9 10*3/uL (ref 4.0–10.5)

## 2017-08-25 LAB — PSA: Prostatic Specific Antigen: 38.84 ng/mL — ABNORMAL HIGH (ref 0.00–4.00)

## 2017-08-25 LAB — MRSA PCR SCREENING: MRSA BY PCR: NEGATIVE

## 2017-08-25 LAB — GLUCOSE, CAPILLARY: Glucose-Capillary: 179 mg/dL — ABNORMAL HIGH (ref 70–99)

## 2017-08-25 NOTE — Care Management Note (Signed)
Case Management Note  Patient Details  Name: Taylor [name] MRN: [id number] Date of Birth: 1955/05/20  Subjective/Objective:    From a Group Home, presents with copd ex. Tachycardia, hypotension, hx of prostate ca, bipolar dz.                Action/Plan: NCM will follow for transition of care needs.   Expected Discharge Date:  08/27/17               Expected Discharge Plan:  Group Home  In-House Referral:  Clinical Social Work  Discharge planning Services  CM Consult  Post Acute Care Choice:    Choice offered to:     DME Arranged:    DME Agency:     HH Arranged:    HH Agency:     Status of Service:  In process, will continue to follow  If discussed at Long Length of Stay Meetings, dates discussed:    Additional Comments:  [name], RN 08/25/2017, 10:52 AM

## 2017-08-25 NOTE — Progress Notes (Signed)
Patient currently on [facility] with sats of 97% and is resting comfortably. All vitals are stable. BIPAP not needed at this time. Will continue to monitor.

## 2017-08-25 NOTE — Progress Notes (Signed)
Inpatient Diabetes Program Recommendations  AACE/ADA: New Consensus Statement on Inpatient Glycemic Control (2015)  Target Ranges:  Prepandial:   less than 140 mg/dL      Peak postprandial:   less than 180 mg/dL (1-2 hours)      Critically ill patients:  140 - 180 mg/dL   Lab Results  Component Value Date   GLUCAP 179 (H) 08/25/2017   HGBA1C 6.5 (H) 04/29/2017    Review of Glycemic Control Results for Taylor [name], Taylor [name] (MRN [id number]) as of 08/25/2017 14:45  Ref. Range 08/24/2017 18:38 08/24/2017 18:40 08/25/2017 02:16  Glucose Latest Ref Range: 70 - 99 mg/dL 232 (H)  228 (H)   Diabetes history: Type 2 DM Outpatient Diabetes medications: none Current orders for Inpatient glycemic control: none Prednisone 40 mg QAM  Inpatient Diabetes Program Recommendations:    Last A1C was from 04/2017, consider repeating A1C?  Also, in the setting of steroids, consider adding Novolog 0-9 units TID, Novolog 0-5 units under glycemic control order set.   Thanks, Bronson Curb, MSN, RNC-OB Diabetes Coordinator [phone] (8a-5p)

## 2017-08-25 NOTE — Progress Notes (Signed)
Subjective: Taylor [name] is laying comfortably in bed, but endorsing worsening anxiety, sudden onset sob which will awaken him from sleep, shakiness, diaphoresis, and racing thoughts. He used to take aripiprazole and escitalopram for these symptoms but hasn't been able to get those medicines in a year. Due to these symptoms, it is hard for him to determine whether or not his copd symptoms have improved. He his tolerating his home 3L [place] O2 and no longer needing BiPAP. He has been NPO and would like to eat again.   Objective:  Vital signs in last 24 hours: Vitals:   08/25/17 0300 08/25/17 0803 08/25/17 0857 08/25/17 0903  BP: 118/80 118/76    Pulse: (!) 108     Resp: 20 19    Temp: 98.3 F (36.8 C) 98.2 F (36.8 C)    TempSrc: Oral Oral    SpO2: 95% 97% 97% 97%  Weight:      Height:       Gen: Well appearing, NAD, 3L [place] ENT: OP clear without erythema or exudate.  Neck: No cervical LAD No JVD. CV: distant heart sounds, RRR, no murmurs Pulm: Normal effort, distant breath sounds, CTA throughout, no wheezing Abd: Soft, NT, ND, normal BS.  Ext: Warm, no edema Neuro: CN II-XII intact, upper and lower extremity strength 5/5 bilaterally, right lower extremity clonus, upper extremity resting tremor right > left.  Skin:  No rashes   Assessment/Plan:  Active Problems:   COPD with acute exacerbation (HCC)   Depression   Bipolar 1 disorder [facility])  Taylor [name] is a 62 yo male with h/o COPD on 3L [place] at home, untreated prostate cancer, colon cancer s/p chemo and surgery in 2013, depression, and bipolar 1 disorder who presents with increased shortness of breath concerning for COPD exacerbation even though he denies productive cough, fever, and CXR unremarkable except for hyperinflation.   #COPD Exacerbation: H/o multiple ED visits for SOB, responding to steroids and nebulizers. Recently finished a steroid taper 2 days ago. Denies productive cough, chest pain. No leukocytosis or fever. CXR  was not concerning for infection. Initially needed BiPAP on admission, but now on home 3L [place] after starting nebulizer and steroid treatments.  - Transfer to floor today - Continue Atrovent nebulizer 0.5mg  q6h, Dulera 2 puffs BID, levabuterol nebulizer q6h prn - Prednisone 40mg  qd x 14 days, then taper - F/u case management c/s for recs on obtaining medications as patient lives in a shelter - Consider starting PPI if symptoms do not improve  #Tachycardia, Hypotension: Resolved with 1L IVFs. Present on admission, patient asymptomatic. On exam today, cardiac monitor was showing intermittent PVCs. Taylor [name] denies chest pains, but reports increased anxiety (see Bipolar problem below).  - f/u EKG results - Continue cardiac monitoring   #Bipolar 1 Disorder: Chronic, unstable. Reports past treatment with aripiprazole and escitalopram but has been off these medicines for a year. Now endorsing worsening anxiety, sudden onset sob which will awaken him from sleep, shakiness, diaphoresis, and racing thoughts. He used to follow at [facility] 5mg  qd - Escitalopram 10mg  qd - Arrange outpatient follow up. Appreciate case managements help    #Prostate Cancer: Taylor [name] states he was diagnosed 1 year ago while in prison. Gleason score 7. Radiation was recommended but he has yet to follow up. On exam, no palpable lymphadenopathy, patient is well nourished. He does have right lower extremity clonus and a bilateral upper extremity resting tremor.  - Continue Flomax 0.4mg  qd - PSA: elevated  to 38.84 on admission - Arrange for outpatient follow up with urology   Dispo: Anticipated discharge in approximately 2 day(s).   Isabelle Course, MD 08/25/2017, 10:58 AM Pager: [phone]

## 2017-08-25 NOTE — Progress Notes (Signed)
CNA came to tell me that after patient got back into bed from using the bathroom he said "i'm just ready for all of this to be over". "I'm ready to jump out the window", "I'm ready for hospice to come in and just take me out". I went into room to assess patient and he denies actually wanting to harm himself. He said "I am just frustrated but shouldn't say stuff like that". He said he does not want to kill himself and has never tried to in the past. He said "I am sorry for scaring her, I did not mean it."  I informed the on call resident and she said he does not need a safety sitter at this time and they will assess when they come to round on him.

## 2017-08-26 LAB — GLUCOSE, CAPILLARY: Glucose-Capillary: 100 mg/dL — ABNORMAL HIGH (ref 70–99)

## 2017-08-26 MED ORDER — PREDNISONE 20 MG PO TABS: ORAL_TABLET | ORAL | 0 refills | Status: DC

## 2017-08-26 MED ORDER — ARIPIPRAZOLE 5 MG PO TABS: 5.0000 mg | ORAL_TABLET | Freq: Every day | ORAL | 3 refills | Status: DC

## 2017-08-26 MED ORDER — ESCITALOPRAM OXALATE 10 MG PO TABS: 10.0000 mg | ORAL_TABLET | Freq: Every day | ORAL | 3 refills | Status: DC

## 2017-08-26 MED ORDER — ALBUTEROL SULFATE HFA 108 (90 BASE) MCG/ACT IN AERS: 2.0000 | INHALATION_SPRAY | Freq: Four times a day (QID) | RESPIRATORY_TRACT | 3 refills | Status: DC | PRN

## 2017-08-26 MED ORDER — ALBUTEROL SULFATE (5 MG/ML) 0.5% IN NEBU: 5.0000 mg | INHALATION_SOLUTION | Freq: Four times a day (QID) | RESPIRATORY_TRACT | 3 refills | Status: DC | PRN

## 2017-08-26 NOTE — Progress Notes (Addendum)
ED CSW met with pt at bedside. Pt is from [facility] group home. Pt confirmed that his oxygen is there. Pt is returning to [address], [place].   CSW called [facility] for transportation due to pt not having transferable oxygen. [facility] called, pt is on the list. Per PTAR, there are a number of pts on the list ahead of pt.   Wendelyn Breslow, [facility]  [phone]

## 2017-08-26 NOTE — Discharge Summary (Signed)
Name: Taylor [name] MRN: [id number] DOB: 1955-12-17 62 y.o. PCP: [name]  Date of Admission: 08/24/2017  5:58 PM Date of Discharge:  Attending Physician: Taylor Belt, MD  Discharge Diagnosis: 1. COPD Exacerbation  Discharge Medications: Allergies as of 08/26/2017      Reactions   Codeine Nausea And Vomiting      Medication List    STOP taking these medications   acetaminophen 500 MG tablet Commonly known as:  TYLENOL   azithromycin 250 MG tablet Commonly known as:  ZITHROMAX Z-PAK   polyethylene glycol packet Commonly known as:  MIRALAX / GLYCOLAX     TAKE these medications   albuterol 108 (90 Base) MCG/ACT inhaler Commonly known as:  PROVENTIL HFA;VENTOLIN HFA Inhale 2 puffs into the lungs every 6 (six) hours as needed for wheezing or shortness of breath. What changed:  You were already taking a medication with the same name, and this prescription was added. Make sure you understand how and when to take each.   albuterol (5 MG/ML) 0.5% nebulizer solution Commonly known as:  PROVENTIL Take 1 mL (5 mg total) by nebulization every 6 (six) hours as needed for wheezing or shortness of breath. What changed:  Another medication with the same name was added. Make sure you understand how and when to take each.   ARIPiprazole 5 MG tablet Commonly known as:  ABILIFY Take 1 tablet (5 mg total) by mouth daily with breakfast. Start taking on:  08/27/2017 What changed:  when to take this   escitalopram 10 MG tablet Commonly known as:  LEXAPRO Take 1 tablet (10 mg total) by mouth daily. Start taking on:  08/27/2017 What changed:    medication strength  how much to take  when to take this   ipratropium 0.02 % nebulizer solution Commonly known as:  ATROVENT Take 2.5 mLs (0.5 mg total) by nebulization 4 (four) times daily.   lovastatin 20 MG tablet Commonly known as:  MEVACOR Take 1 tablet (20 mg total) by mouth at bedtime.     mometasone-formoterol 100-5 MCG/ACT Aero Commonly known as:  DULERA Inhale 2 puffs into the lungs 2 (two) times daily.   OXYGEN Inhale 3 L into the lungs.   predniSONE 20 MG tablet Commonly known as:  DELTASONE Take 2 tablets (40 mg total) by mouth daily with breakfast for 3 days, THEN 1.5 tablets (30 mg total) daily with breakfast for 3 days, THEN 1 tablet (20 mg total) daily with breakfast for 3 days, THEN 0.5 tablets (10 mg total) daily with breakfast for 3 days. Start taking on:  08/27/2017 What changed:  See the new instructions.   tamsulosin 0.4 MG Caps capsule Commonly known as:  FLOMAX Take 1 capsule (0.4 mg total) by mouth daily. What changed:  Another medication with the same name was removed. Continue taking this medication, and follow the directions you see here.   tiotropium 18 MCG inhalation capsule Commonly known as:  SPIRIVA HANDIHALER Place 1 capsule (18 mcg total) into inhaler and inhale daily.       Disposition and follow-up:   Taylor [name] was discharged from [facility] in Good condition.  At the hospital follow up visit please address:  Bipolar disorder: Patient used to follow at [facility] and used to be on Celexa and Abilify. We restarted him on Celexa and Abilify which greatly improved his symptoms of anxiousness, sob, chest pain, nightmares.   COPD:  Please review COPD regiment (  discharged on Dulera and albuterol). He was also discharged on a 14 day prednisone taper and instructed to make an appointment with his pulmonologist, Taylor [name], to evaluate him for chronic low dose prednisone given his recurrent COPD exacerbations. Ensure that he has made an appt with Taylor [name] (pulmonology)  Prostate Cancer: I put in a referral to urology for confirmation of prostate cancer. The patient was diagnosed while in prison and never followed up. His PSA was elevated during admission. Please ask Taylor [name] about the referral. The patient must be seen by  urology first before going to rad onc.   2.  Labs / imaging needed at time of follow-up: none  3.  Pending labs/ test needing follow-up: none   Follow-up Appointments: Follow-up Information    [facility] Follow up.   Why:  Please go to your already scheduled appointment on Monday, August 12th at 1:15pm Contact information: [address] [place] [facility] Course by problem list:  Taylor [name] is a 62 yo male with h/o COPD on 3L [place] at home, untreated prostate cancer, colon cancer s/p chemo and surgery in 2013, depression, and bipolar 1 disorder who presents with increased shortness of breath concerning for COPD exacerbation even though he denies productive cough, fever, and CXR unremarkable except for hyperinflation.   #COPD Exacerbation: H/o multiple ED visits for SOB, responding to steroids and nebulizers. July 2015 with FEV1 32% of predicted and FVC 61% of predicted. Recently finished a steroid taper 2 days ago. Denies productive cough, chest pain. No leukocytosis or fever. CXR was not concerning for infection. CTA negative for PE. Initially needed BiPAP on for first night of admission, but now on home 3L [place] after starting nebulizer and steroid treatments.  - Continue Atrovent nebulizer 0.5mg  q6h, Dulera 2 puffs BID, levabuterol nebulizer q6h prn - Discharged on Prednisone taper (40mg  qd x 7 days, then 20mg  qd x 7 days, then 10mg  qd x 7 days)  #Bipolar 1 Disorder: Chronic, unstable. Reports past treatment with aripiprazole and escitalopram but has been off these medicines for a year. Now endorsing worsening anxiety, sudden onset sob which will awaken him from sleep, shakiness, diaphoresis, and racing thoughts. He used to follow at [facility] - Reports improved symptoms with aripriprazole and escitalopram  - Aripriprazole 5mg  qd - Escitalopram 10mg  qd - Arrange outpatient follow up. Appreciate case managements  help   #Prostate Cancer: Taylor [name] states he was diagnosed 1 year ago while in prison. Gleason score 7. Radiation was recommended but he has yet to follow up. On exam, no palpable lymphadenopathy, patient is well nourished. He does have right lower extremity clonus and a bilateral upper extremity resting tremor.  - Continue Flomax 0.4mg  qd - PSA: elevated to 38.84 on admission - Arrange for outpatient follow up with urology  Discharge Vitals:   BP 124/83 (BP Location: Right Arm)   Pulse 92   Temp 98.1 F (36.7 C) (Oral)   Resp 20   Ht 5' 11.02" (1.804 m)   Wt 159 lb 13.3 oz (72.5 kg)   SpO2 96%   BMI 22.28 kg/m   Pertinent Labs, Studies, and Procedures:    Discharge Instructions: Discharge Instructions    Ambulatory referral to Urology   Complete by:  As directed    Prostate cancer. Diagnosed while in prison, never followed up. PSA elevated   Call MD for:  difficulty breathing, headache or  visual disturbances   Complete by:  As directed    Call MD for:  temperature >100.4   Complete by:  As directed    Diet - low sodium heart healthy   Complete by:  As directed    Diet - low sodium heart healthy   Complete by:  As directed    Discharge instructions   Complete by:  As directed    Taylor [name], [name] were admitted to the hospital for treatment of your lung disease (COPD). We treated you with steroids and breathing treatments and also started you back on Abilify and Celexa. I think a large portion of your shortness of breath and chest pain was coming from being off of Abilify and Celexa. I have sent prescriptions for these medications to your pharmacy [facility] on [address]). I also sent in a prescription for an Albuterol inhaler to use as needed. Please follow up with your primary care doctor to ensure a continued supply of your medications.   Discharge instructions   Complete by:  As directed    Taylor [name], Taylor [name] were admitted to the hospital for treatment  of your lung disease (COPD). We treated you with steroids and breathing treatments and also started you back on Abilify and Celexa. I think a large portion of your shortness of breath and chest pain was coming from being off of Abilify and Celexa. I have sent prescriptions for these medications to your pharmacy [facility] on [address]). I also sent in a prescription for an Albuterol inhaler to use as needed.  I have scheduled an appointment for you in our Internal Medicine Clinic located on the ground floor of the hospital. Please go to this appointment on Monday, August 12th at 1:15pm. I also placed a referral to urology so you can get started with work up and treatment of your prostate cancer. You must go to the appointment on August 12th in order to get referred to Urology. Please call your pulmonologist, Taylor [name], and schedule a follow up appointment. I have put you on a 14 day steroid taper and Taylor [name] needs to evaluate you after the taper to determine whether or not you would benefit from chronic steroid use.   It was a pleasure taking care of you!   Increase activity slowly   Complete by:  As directed    Increase activity slowly   Complete by:  As directed      Signed: Isabelle Course, MD 08/26/2017, 5:27 PM   Pager: [phone]

## 2017-08-26 NOTE — Progress Notes (Signed)
   Subjective: [name] is feeling much better today after starting Abilify and Celexa. He slept well, did not have nightmares, and denies chest pain, sob, shakiness, or feeling anxious. He continues to maintain his oxygen saturation on his home 3L [place]. We discussed the importance of adhering to his Abilify and Celexa and sending him home today.   Objective:  Vital signs in last 24 hours: Vitals:   08/26/17 0148 08/26/17 0814 08/26/17 0841 08/26/17 1401  BP:  124/83    Pulse:  86 93 92  Resp:  20 20 20   Temp:  98.1 F (36.7 C)    TempSrc:  Oral    SpO2: 98% 97% 97% 96%  Weight:      Height:       Gen: Well appearing, NAD CV: distant heart sounds but RRR, no murmurs, no JVD Pulm: Normal effort, decreased breath sounds, CTA throughout, no wheezing Abd: Soft, NT, ND, normal BS.  Ext: Warm, no edema, normal joints Skin:  No rashes  Assessment/Plan:  Active Problems:   COPD with acute exacerbation (HCC)   Depression   Bipolar 1 disorder [facility])  Taylor [name] is a 62 yo male with h/o COPD on 3L [place] at home, untreated prostate cancer, colon cancer s/p chemo and surgery in 2013, depression, and bipolar 1 disorder who presents with increased shortness of breath concerning for COPD exacerbation even though he denies productive cough, fever, and CXR unremarkable except for hyperinflation.   #COPD Exacerbation: H/o multiple ED visits for SOB, responding to steroids and nebulizers. Recently finished a steroid taper 2 days ago. Denies productive cough, chest pain. No leukocytosis or fever. CXR was not concerning for infection. Initially needed BiPAP on admission, but now on home 3L [place] after starting nebulizer and steroid treatments.  - Continue Atrovent nebulizer 0.5mg  q6h, Dulera 2 puffs BID, levabuterol nebulizer q6h prn - Prednisone 40mg  qd x 14 days, then taper  #Tachycardia, Hypotension: Hypotension resolved with 1L IVFs. Present on admission, patient asymptomatic. On cardiac exam,  he is RRR. Taylor [name] denies chest pains, sob, palpitations - Continue cardiac monitoring   #Bipolar 1 Disorder: Chronic, unstable. Reports past treatment with aripiprazole and escitalopram but has been off these medicines for a year. Now endorsing worsening anxiety, sudden onset sob which will awaken him from sleep, shakiness, diaphoresis, and racing thoughts. He used to follow at [facility] - Reports improved symptoms with aripriprazole and escitalopram  - Aripriprazole 5mg  qd - Escitalopram 10mg  qd - Arrange outpatient follow up. Appreciate case managements help   #Prostate Cancer: Taylor [name] states he was diagnosed 1 year ago while in prison. Gleason score 7. Radiation was recommended but he has yet to follow up. On exam, no palpable lymphadenopathy, patient is well nourished. He does have right lower extremity clonus and a bilateral upper extremity resting tremor.  - Continue Flomax 0.4mg  qd - PSA: elevated to 38.84 on admission - Arrange for outpatient follow up with urology   Dispo: Anticipated discharge in approximately 1 day(s).   Isabelle Course, MD 08/26/2017, 2:09 PM Pager: [phone]

## 2017-08-27 NOTE — Progress Notes (Signed)
Patient discharged home with belongings, discharge instructions and prescription. IV saline lock discontinued and transported by [facility] via stretcher on Oxygen.

## 2017-08-29 ENCOUNTER — Other Ambulatory Visit: Payer: Self-pay

## 2017-08-29 ENCOUNTER — Encounter (HOSPITAL_COMMUNITY): Payer: Self-pay | Admitting: Emergency Medicine

## 2017-08-29 ENCOUNTER — Emergency Department (HOSPITAL_COMMUNITY)
Admission: EM | Admit: 2017-08-29 | Discharge: 2017-08-29 | Discharge status: Against Medical Advice | Payer: Medicaid Other | Attending: Emergency Medicine | Admitting: Emergency Medicine

## 2017-08-29 DIAGNOSIS — F1010 Alcohol abuse, uncomplicated: Secondary | ICD-10-CM | POA: Diagnosis not present

## 2017-08-29 DIAGNOSIS — R0602 Shortness of breath: Secondary | ICD-10-CM | POA: Diagnosis present

## 2017-08-29 DIAGNOSIS — J45909 Unspecified asthma, uncomplicated: Secondary | ICD-10-CM | POA: Insufficient documentation

## 2017-08-29 DIAGNOSIS — F319 Bipolar disorder, unspecified: Secondary | ICD-10-CM | POA: Diagnosis not present

## 2017-08-29 DIAGNOSIS — Z85038 Personal history of other malignant neoplasm of large intestine: Secondary | ICD-10-CM | POA: Diagnosis not present

## 2017-08-29 DIAGNOSIS — F4321 Adjustment disorder with depressed mood: Secondary | ICD-10-CM | POA: Diagnosis not present

## 2017-08-29 DIAGNOSIS — Z87891 Personal history of nicotine dependence: Secondary | ICD-10-CM | POA: Insufficient documentation

## 2017-08-29 DIAGNOSIS — Z8546 Personal history of malignant neoplasm of prostate: Secondary | ICD-10-CM | POA: Insufficient documentation

## 2017-08-29 MED ORDER — PREDNISONE 20 MG PO TABS
60.0000 mg | ORAL_TABLET | Freq: Once | ORAL | Status: AC
  Administered 2017-08-29: 60 mg via ORAL
  Filled 2017-08-29: qty 3

## 2017-08-29 MED ORDER — TIOTROPIUM BROMIDE MONOHYDRATE 18 MCG IN CAPS: 18.0000 ug | ORAL_CAPSULE | Freq: Every day | RESPIRATORY_TRACT | 0 refills | Status: DC

## 2017-08-29 MED ORDER — PREDNISONE 20 MG PO TABS: ORAL_TABLET | ORAL | 0 refills | Status: DC

## 2017-08-29 NOTE — ED Triage Notes (Signed)
Patient arrived from home. Complains of respiratory distress starting yesterday evening. Unable to pick up home medications to assiist with breathing. Give 125mg  of solumedrol, 5mg  of albuterol, and 0.5 atrovent by EMS. Pt alert and oriented X 4. Wheezing present in all lobes.

## 2017-08-29 NOTE — ED Notes (Signed)
EDP Messner at bedside.

## 2017-08-29 NOTE — ED Notes (Signed)
CSW at bedside.

## 2017-08-29 NOTE — ED Notes (Signed)
Pt would like to have prednisone called in- states "there is no reason for me to be here, I just need my medicine"

## 2017-08-29 NOTE — ED Notes (Signed)
ED Provider at bedside. Messner Awaiting [facility]

## 2017-08-29 NOTE — Progress Notes (Addendum)
CSW received handoff from Network engineer. Per Network engineer, received phone call from group home, [phone]. CSW left voicemail for group home. CSW asked for phone call back.  Wendelyn Breslow, [facility]  [phone]

## 2017-08-29 NOTE — ED Provider Notes (Signed)
Emergency Department Provider Note   I have reviewed the triage vital signs and the nursing notes.   HISTORY  Chief Complaint Shortness of Breath   HPI Taylor [name] is a 62 y.o. male medical problems documented below the presents to the emergency department today secondary to shortness of breath.  Patient states that shortness of breath is a normal COPD exacerbation for him however he was able to get his medications because of some social issues.  He subsequently says that his sister will pick up his medications for him and bring them to him.  Patient refusing any further work-up once he found this out. No other associated or modifying symptoms.    Past Medical History:  Diagnosis Date  . Asthma   . Bipolar 1 disorder ([place]) 02/05/2012  . Colon cancer ([place]) 04/11/11   adenocarcinoma of colon, 7/19 nodes pos.FINISHED CHEMO/DR. SHERRILL  . COPD (chronic obstructive pulmonary disease) ([place])    SMOKER  . Depression   . Emphysema of lung ([place])   . Full dentures   . Hemorrhoids   . On home oxygen therapy    "3L; 24/7" (05/29/2017)  . Pneumonia ~ 2016   "double pneumonia"  . Prostate cancer ([place])    Gleason score = 7, supposed to have radiation therapy but he has not followed up (05/29/2017)  . Rib fractures    hx of    Patient Active Problem List   Diagnosis Date Noted  . Malnutrition of moderate degree 06/14/2017  . Hyperglycemia 06/08/2017  . Constipation 06/08/2017  . SIRS (systemic inflammatory response syndrome) ([place]) 05/21/2017  . Tobacco abuse 05/13/2017  . HLD (hyperlipidemia) 05/13/2017  . Leukocytosis 04/06/2017  . Adjustment disorder with depressed mood 03/28/2017  . Adjustment disorder with mixed disturbance of emotions and conduct   . Chronic respiratory failure with hypoxia ([place]) 03/24/2017  . Normocytic normochromic anemia 03/24/2017  . COPD with acute exacerbation ([place]) 10/22/2016  . Alcohol abuse 01/09/2013  . COPD exacerbation ([place]) 01/09/2013  .  Chest pain 01/09/2013  . Smoker 12/16/2012  . Inguinal hernia unilateral, non-recurrent, right 05/01/2012  . Dyspepsia 02/05/2012  . Bipolar 1 disorder ([place]) 02/05/2012  . 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 COPD  GOLD III 02/03/2012  . Thrombocytopenia ([place]) 02/03/2012  . Depression   . 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 COLON SURGERY  04/11/11   Sigmoid colectomy  . COLOSTOMY REVISION  04/11/2011   Procedure: COLON RESECTION SIGMOID;  Surgeon: [name], MD;  Location: [facility];  Service: General;  Laterality: N/A;  low anterior colon resection   . HERNIA REPAIR    . INGUINAL HERNIA REPAIR Right 05/01/2012   Procedure: HERNIA REPAIR INGUINAL ADULT;  Surgeon: [name], MD;  Location: [facility];  Service: General;  Laterality: Right;  . INSERTION OF MESH Right 05/01/2012   Procedure: INSERTION OF MESH;  Surgeon: [name], MD;  Location: [facility];  Service: General;  Laterality: Right;  . PORT-A-CATH REMOVAL Left 05/01/2012   Procedure: REMOVAL Infusion Port;  Surgeon: [name], MD;  Location: [facility];  Service: General;  Laterality: Left;  . PORTACATH PLACEMENT  05/02/2011   Procedure: INSERTION PORT-A-CATH;  Surgeon: [name], MD;  Location: [facility];  Service: General;  Laterality: N/A;  . PROSTATE BIOPSY      Current Outpatient Rx  . Order #: [id number] Class: Normal  . Order #: [id number] Class: Normal  .  Order #: [id number] Class: Normal  . Order #: [id number] Class: Normal  . Order #: [id number] Class: Normal  . Order #: [id number] Class: Normal  . Order #: [id number] Class: Historical Med  . Order #: [id number] Class: Normal  . Order #: [id number] Class: Normal    Allergies Codeine  Family History  Problem Relation Age of Onset  . Heart disease Father   . Lung cancer Maternal Uncle        smoked    Social History Social History   Tobacco Use  . Smoking status: Former Smoker    Packs/day: 0.10     Years: 46.00    Pack years: 4.60    Types: Cigarettes    Last attempt to quit: 05/21/2017    Years since quitting: 0.2  . Smokeless tobacco: Former Systems developer    Types: Chew  Substance Use Topics  . Alcohol use: Not Currently    Comment: h/o use in the past, no h/o heavy use  . Drug use: No    Review of Systems  All other systems negative except as documented in the HPI. All pertinent positives and negatives as reviewed in the HPI. ____________________________________________   PHYSICAL EXAM:  VITAL SIGNS: ED Triage Vitals  Enc Vitals Group     BP 08/29/17 1544 (!) 109/96     Pulse Rate 08/29/17 1544 98     Resp 08/29/17 1544 (!) 23     Temp 08/29/17 1544 98.6 F (37 C)     Temp Source 08/29/17 1544 Oral     SpO2 08/29/17 1538 98 %     Weight 08/29/17 1546 160 lb (72.6 kg)     Height 08/29/17 1546 5\' 11"  (1.803 m)     Head Circumference --      Peak Flow --      Pain Score 08/29/17 1545 5     Pain Loc --      Pain Edu? --      Excl. in [place]? --     Constitutional: Alert and oriented. Well appearing and in no acute distress. Eyes: Conjunctivae are normal. PERRL. EOMI. Head: Atraumatic. Nose: No congestion/rhinnorhea. Mouth/Throat: Mucous membranes are moist.  Oropharynx non-erythematous. Neck: No stridor.  No meningeal signs.   Cardiovascular: Normal rate, regular rhythm. Good peripheral circulation. Grossly normal heart sounds.   Respiratory: Normal respiratory effort.  No retractions. Lungs CTAB. Gastrointestinal: Soft and nontender. No distention.  Musculoskeletal: No lower extremity tenderness nor edema. No gross deformities of extremities. Neurologic:  Normal speech and language. No gross focal neurologic deficits are appreciated.  Skin:  Skin is warm, dry and intact. No rash noted.   ____________________________________________   LABS (all labs ordered are listed, but only abnormal results are displayed)  Labs Reviewed - No data to  display ____________________________________________  EKG   ____________________________________________  RADIOLOGY  No results found.  ____________________________________________   PROCEDURES  Procedure(s) performed:   Procedures   ____________________________________________   INITIAL IMPRESSION / ASSESSMENT AND PLAN / ED COURSE  Mostly a social visit is taking his medications.  Give him a dose of them here and will print and sent to pharmacy.  He states his sister to help him up.  Consult placed to care management to help with follow-up to make sure that he is get his medications. Will sign out AMA as he is high risk for ACS, COPD, PE and multiple other possible issues and he is refusing that workup.  Pertinent labs & imaging results that were available during my care of the  patient were reviewed by me and considered in my medical decision making (see chart for details).  ____________________________________________  FINAL CLINICAL IMPRESSION(S) / ED DIAGNOSES  Final diagnoses:  Shortness of breath     MEDICATIONS GIVEN DURING THIS VISIT:  Medications  predniSONE (DELTASONE) tablet 60 mg (60 mg Oral Given 08/29/17 1720)     NEW OUTPATIENT MEDICATIONS STARTED DURING THIS VISIT:  New Prescriptions   PREDNISONE (DELTASONE) 20 MG TABLET    3 tabs po daily x 3 days, then 2 tabs x 3 days, then 1.5 tabs x 3 days, then 1 tab x 3 days, then 0.5 tabs x 3 days    Note:  This note was prepared with assistance of Dragon voice recognition software. Occasional wrong-word or sound-a-like substitutions may have occurred due to the inherent limitations of voice recognition software.   Marytza Grandpre, Corene Cornea, MD 08/30/17 [phone]

## 2017-08-29 NOTE — ED Notes (Signed)
Patient refusing transport to xray

## 2017-08-30 ENCOUNTER — Emergency Department (HOSPITAL_COMMUNITY): Payer: Medicaid Other

## 2017-08-30 ENCOUNTER — Observation Stay (HOSPITAL_COMMUNITY)
Admission: EM | Admit: 2017-08-30 | Discharge: 2017-09-01 | Disposition: A | Discharge status: Home / Self Care | Payer: Medicaid Other | Attending: Oncology | Admitting: Oncology

## 2017-08-30 ENCOUNTER — Encounter (HOSPITAL_COMMUNITY): Payer: Self-pay | Admitting: Emergency Medicine

## 2017-08-30 ENCOUNTER — Other Ambulatory Visit: Payer: Self-pay

## 2017-08-30 DIAGNOSIS — J9602 Acute respiratory failure with hypercapnia: Secondary | ICD-10-CM | POA: Diagnosis not present

## 2017-08-30 DIAGNOSIS — Z599 Problem related to housing and economic circumstances, unspecified: Secondary | ICD-10-CM | POA: Diagnosis not present

## 2017-08-30 DIAGNOSIS — C61 Malignant neoplasm of prostate: Secondary | ICD-10-CM | POA: Diagnosis not present

## 2017-08-30 DIAGNOSIS — F319 Bipolar disorder, unspecified: Secondary | ICD-10-CM | POA: Insufficient documentation

## 2017-08-30 DIAGNOSIS — J441 Chronic obstructive pulmonary disease with (acute) exacerbation: Secondary | ICD-10-CM | POA: Diagnosis not present

## 2017-08-30 DIAGNOSIS — Z885 Allergy status to narcotic agent status: Secondary | ICD-10-CM | POA: Insufficient documentation

## 2017-08-30 DIAGNOSIS — F419 Anxiety disorder, unspecified: Secondary | ICD-10-CM | POA: Diagnosis not present

## 2017-08-30 DIAGNOSIS — Z85038 Personal history of other malignant neoplasm of large intestine: Secondary | ICD-10-CM | POA: Diagnosis not present

## 2017-08-30 DIAGNOSIS — Z79899 Other long term (current) drug therapy: Secondary | ICD-10-CM | POA: Insufficient documentation

## 2017-08-30 DIAGNOSIS — Z9981 Dependence on supplemental oxygen: Secondary | ICD-10-CM | POA: Diagnosis not present

## 2017-08-30 DIAGNOSIS — R0600 Dyspnea, unspecified: Secondary | ICD-10-CM

## 2017-08-30 DIAGNOSIS — R0602 Shortness of breath: Secondary | ICD-10-CM | POA: Diagnosis present

## 2017-08-30 DIAGNOSIS — Z9221 Personal history of antineoplastic chemotherapy: Secondary | ICD-10-CM | POA: Diagnosis not present

## 2017-08-30 LAB — I-STAT VENOUS BLOOD GAS, ED
ACID-BASE EXCESS: 6 mmol/L — AB (ref 0.0–2.0)
Acid-Base Excess: 9 mmol/L — ABNORMAL HIGH (ref 0.0–2.0)
BICARBONATE: 34.7 mmol/L — AB (ref 20.0–28.0)
Bicarbonate: 38.9 mmol/L — ABNORMAL HIGH (ref 20.0–28.0)
O2 SAT: 60 %
O2 Saturation: 64 %
PCO2 VEN: 80.6 mmHg — AB (ref 44.0–60.0)
PH VEN: 7.289 (ref 7.250–7.430)
PH VEN: 7.309 (ref 7.250–7.430)
PO2 VEN: 36 mmHg (ref 32.0–45.0)
PO2 VEN: 36 mmHg (ref 32.0–45.0)
Patient temperature: 97.4
Patient temperature: 97.4
TCO2: 41 mmol/L — ABNORMAL HIGH (ref 22–32)
TCO2: 37 mmol/L — ABNORMAL HIGH (ref 22–32)
pCO2, Ven: 68.6 mmHg — ABNORMAL HIGH (ref 44.0–60.0)

## 2017-08-30 LAB — I-STAT TROPONIN, ED: TROPONIN I, POC: 0 ng/mL (ref 0.00–0.08)

## 2017-08-30 LAB — BASIC METABOLIC PANEL
Anion gap: 6 (ref 5–15)
BUN: 29 mg/dL — AB (ref 8–23)
CO2: 37 mmol/L — ABNORMAL HIGH (ref 22–32)
CREATININE: 0.9 mg/dL (ref 0.61–1.24)
Calcium: 8.5 mg/dL — ABNORMAL LOW (ref 8.9–10.3)
Chloride: 96 mmol/L — ABNORMAL LOW (ref 98–111)
GFR calc Af Amer: >60 mL/min (ref >60)
GLUCOSE: 142 mg/dL — AB (ref 70–99)
Potassium: 3.8 mmol/L (ref 3.5–5.1)
SODIUM: 139 mmol/L (ref 135–145)

## 2017-08-30 LAB — CBC
HCT: 44.8 % (ref 39.0–52.0)
Hemoglobin: 14 g/dL (ref 13.0–17.0)
MCH: 30.4 pg (ref 26.0–34.0)
MCHC: 31.3 g/dL (ref 30.0–36.0)
MCV: 97.2 fL (ref 78.0–100.0)
PLATELETS: 219 10*3/uL (ref 150–400)
RBC: 4.61 MIL/uL (ref 4.22–5.81)
RDW: 12.6 % (ref 11.5–15.5)
WBC: 13.6 10*3/uL — ABNORMAL HIGH (ref 4.0–10.5)

## 2017-08-30 LAB — I-STAT CG4 LACTIC ACID, ED: Lactic Acid, Venous: 1.24 mmol/L (ref 0.5–1.9)

## 2017-08-30 MED ORDER — IPRATROPIUM-ALBUTEROL 0.5-2.5 (3) MG/3ML IN SOLN
3.0000 mL | Freq: Once | RESPIRATORY_TRACT | Status: AC
  Administered 2017-08-30: 3 mL via RESPIRATORY_TRACT

## 2017-08-30 NOTE — ED Notes (Signed)
BiPAP placed at this time by RRT, MD Bero at bedside

## 2017-08-30 NOTE — ED Notes (Signed)
Dr Sedonia Small informed of venous blood gas results

## 2017-08-30 NOTE — ED Triage Notes (Signed)
[facility]; per EMS, pt here yesterday for same and was discharged with Rx for Prednisone but wasn't able to get it filled; pt arrived to [facility] ED with duoneb being administered, also 125mg  solumedrol given enroute; pt on 3 L home O2

## 2017-08-30 NOTE — ED Provider Notes (Signed)
[facility] Emergency Department Provider Note MRN:  [id number]  Arrival date & time: 08/31/17     Chief Complaint   Chest Pain and Respiratory Distress   History of Present Illness   Taylor [name] is a 62 y.o. year-old male with a history of COPD presenting to the ED with chief complaint of shortness of breath.  Patient explains that he was in the emergency department yesterday, was discharged and was told to fill his prednisone.  Could not afford his prednisone.  Today began experiencing return of gradual onset shortness of breath.  Now feeling extremely uncomfortable due to the shortness of breath, also with diffuse chest tightness.  Denies fevers or cough, no abdominal pain.  I was unable to obtain an accurate HPI, PMH, or ROS due to the patient's respiratory distress.  Review of Systems  Positive for shortness of breath, chest pain.  Patient's Health History    Past Medical History:  Diagnosis Date  . Asthma   . Bipolar 1 disorder ([place]) 02/05/2012  . Colon cancer ([place]) 04/11/11   adenocarcinoma of colon, 7/19 nodes pos.FINISHED CHEMO/DR. SHERRILL  . COPD (chronic obstructive pulmonary disease) ([place])    SMOKER  . Depression   . Emphysema of lung ([place])   . Full dentures   . Hemorrhoids   . On home oxygen therapy    "3L; 24/7" (05/29/2017)  . Pneumonia ~ 2016   "double pneumonia"  . Prostate cancer ([place])    Gleason score = 7, supposed to have radiation therapy but he has not followed up (05/29/2017)  . Rib fractures    hx of    Past Surgical History:  Procedure Laterality Date  . COLON SURGERY  04/11/11   Sigmoid colectomy  . COLOSTOMY REVISION  04/11/2011   Procedure: COLON RESECTION SIGMOID;  Surgeon: [name], MD;  [facility];  Service: General;  Laterality: N/A;  low anterior colon resection   . HERNIA REPAIR    . INGUINAL HERNIA REPAIR Right 05/01/2012   Procedure: HERNIA REPAIR INGUINAL ADULT;  Surgeon: [name], MD;  [facility];  Service: General;  Laterality: Right;  . INSERTION OF MESH Right 05/01/2012   Procedure: INSERTION OF MESH;  Surgeon: [name], MD;  [facility];  Service: General;  Laterality: Right;  . PORT-A-CATH REMOVAL Left 05/01/2012   Procedure: REMOVAL Infusion Port;  Surgeon: [name], MD;  [facility];  Service: General;  Laterality: Left;  . PORTACATH PLACEMENT  05/02/2011   Procedure: INSERTION PORT-A-CATH;  Surgeon: [name], MD;  [facility];  Service: General;  Laterality: N/A;  . PROSTATE BIOPSY      Family History  Problem Relation Age of Onset  . Heart disease Father   . Lung cancer Maternal Uncle        smoked    Social History   Socioeconomic History  . Marital status: Divorced    Spouse name: Not on file  . Number of children: Not on file  . Years of education: Not on file  . Highest education level: Not on file  Occupational History  . Occupation: disability pending  Social Needs  . Financial resource strain: Not on file  . Food insecurity:    Worry: Not on file    Inability: Not on file  . Transportation needs:    Medical: Not on file    Non-medical: Not on file  Tobacco Use  . Smoking status:  Former Smoker    Packs/day: 0.10    Years: 46.00    Pack years: 4.60    Types: Cigarettes    Last attempt to quit: 05/21/2017    Years since quitting: 0.2  . Smokeless tobacco: Former Systems developer    Types: Chew  Substance and Sexual Activity  . Alcohol use: Not Currently    Comment: h/o use in the past, no h/o heavy use  . Drug use: No  . Sexual activity: Not Currently  Lifestyle  . Physical activity:    Days per week: Not on file    Minutes per session: Not on file  . Stress: Not on file  Relationships  . Social connections:    Talks on phone: Not on file    Gets together: Not on file    Attends religious service: Not on file    Active member of club or organization: Not on file    Attends meetings of clubs or organizations: Not on  file    Relationship status: Not on file  . Intimate partner violence:    Fear of current or ex partner: Not on file    Emotionally abused: Not on file    Physically abused: Not on file    Forced sexual activity: Not on file  Other Topics Concern  . Not on file  Social History Narrative   Lives alone-divorced, disabled, in [place]'s   Brother, Torence Palmeri and his wife Jackelyn Poling assist   Prior occupation: Clinical biochemist     Physical Exam  Vital Signs and Nursing Notes reviewed Vitals:   08/30/17 2300 08/30/17 2330  BP: 116/81 123/73  Pulse: 92 87  Resp: (!) 34 (!) 30  Temp:    SpO2: 97% 96%    CONSTITUTIONAL: Ill-appearing, in moderate distress due to respiratory status NEURO:  Alert and oriented x 3, no focal deficits EYES:  eyes equal and reactive ENT/NECK:  no LAD, no JVD CARDIO: Tachycardic rate, well-perfused, normal S1 and S2 PULM: Poor air movement throughout, scattered occasional wheeze GI/GU:  normal bowel sounds, non-distended, non-tender MSK/SPINE:  No gross deformities, no edema SKIN:  no rash, atraumatic PSYCH:  Appropriate speech and behavior  Diagnostic and Interventional Summary    EKG Interpretation  Date/Time:    Ventricular Rate:    PR Interval:    QRS Duration:   QT Interval:    QTC Calculation:   R Axis:     Text Interpretation:        Labs Reviewed  CBC - Abnormal; Notable for the following components:      Result Value   WBC 13.6 (*)    All other components within normal limits  BASIC METABOLIC PANEL - Abnormal; Notable for the following components:   Chloride 96 (*)    CO2 37 (*)    Glucose, Bld 142 (*)    BUN 29 (*)    Calcium 8.5 (*)    All other components within normal limits  I-STAT VENOUS BLOOD GAS, ED - Abnormal; Notable for the following components:   pCO2, Ven 80.6 (*)    Bicarbonate 38.9 (*)    TCO2 41 (*)    Acid-Base Excess 9.0 (*)    All other components within normal limits  I-STAT VENOUS BLOOD GAS, ED - Abnormal;  Notable for the following components:   pCO2, Ven 68.6 (*)    Bicarbonate 34.7 (*)    TCO2 37 (*)    Acid-Base Excess 6.0 (*)    All other  components within normal limits  BLOOD GAS, VENOUS  BASIC METABOLIC PANEL  CBC  I-STAT TROPONIN, ED  I-STAT CG4 LACTIC ACID, ED  I-STAT CG4 LACTIC ACID, ED    DG Chest Port 1 View  Final Result      Medications  enoxaparin (LOVENOX) injection 40 mg (has no administration in time range)  ARIPiprazole (ABILIFY) tablet 5 mg (has no administration in time range)  escitalopram (LEXAPRO) tablet 10 mg (has no administration in time range)  mometasone-formoterol (DULERA) 100-5 MCG/ACT inhaler 2 puff (has no administration in time range)  ipratropium-albuterol (DUONEB) 0.5-2.5 (3) MG/3ML nebulizer solution 3 mL (3 mLs Nebulization Given 08/30/17 2241)  ipratropium-albuterol (DUONEB) 0.5-2.5 (3) MG/3ML nebulizer solution 3 mL (3 mLs Nebulization Given 08/30/17 2159)  ipratropium-albuterol (DUONEB) 0.5-2.5 (3) MG/3ML nebulizer solution 3 mL (3 mLs Nebulization Given 08/30/17 2158)     Procedures Critical Care Critical Care Documentation Critical care time provided by me (excluding procedures): 35 minutes  Condition necessitating critical care: Hypercarbic respiratory failure due to COPD exacerbation  Components of critical care management: reviewing of prior records, laboratory and imaging interpretation, frequent re-examination and reassessment of vital signs, administration of noninvasive positive pressure ventilation, repeated doses of duo nebs, supplemental oxygen therapy, discussion with consultants.    ED Course and Medical Decision Making  I have reviewed the triage vital signs and the nursing notes.  Pertinent labs & imaging results that were available during my care of the patient were reviewed by me and considered in my medical decision making (see below for details). Clinical Course as of Sep 01 19  Sat Aug 31, 7642  [id number] 62 year old  male presenting with chest pain shortness of breath, respiratory distress, hypoxic story failure.  Consistent with COPD exacerbation.  Received Solu-Medrol by EMS.  Very poor air movement on exam.  Much improved with noninvasive positive pressure ventilation.  Will provide multiple DuoNeb therapies and reassess.  Likely ICU admission.   [MB]    Clinical Course User Index [MB] Maudie Flakes, MD    VBG and patient's clinical picture improving on noninvasive positive pressure ventilation.  Admitted to stepdown unit for further care and evaluation.  Barth Kirks. Sedonia Small, [place] [email or web address]  Final Clinical Impressions(s) / ED Diagnoses     ICD-10-CM   1. Acute respiratory failure with hypercapnia (HCC) J96.02   2. SOB (shortness of breath) R06.02 DG Chest [facility] 1 View    DG Chest [place] 1 View  3. COPD exacerbation [facility]) J44.1     ED Discharge Orders    None         Maudie Flakes, MD 08/31/17 0021

## 2017-08-31 ENCOUNTER — Observation Stay (HOSPITAL_COMMUNITY): Payer: Medicaid Other

## 2017-08-31 ENCOUNTER — Encounter (HOSPITAL_COMMUNITY): Payer: Self-pay | Admitting: Radiology

## 2017-08-31 DIAGNOSIS — Z590 Homelessness: Secondary | ICD-10-CM

## 2017-08-31 DIAGNOSIS — Z885 Allergy status to narcotic agent status: Secondary | ICD-10-CM

## 2017-08-31 DIAGNOSIS — Z638 Other specified problems related to primary support group: Secondary | ICD-10-CM

## 2017-08-31 DIAGNOSIS — F419 Anxiety disorder, unspecified: Secondary | ICD-10-CM

## 2017-08-31 DIAGNOSIS — F319 Bipolar disorder, unspecified: Secondary | ICD-10-CM

## 2017-08-31 DIAGNOSIS — C61 Malignant neoplasm of prostate: Secondary | ICD-10-CM

## 2017-08-31 DIAGNOSIS — Z9114 Patient's other noncompliance with medication regimen: Secondary | ICD-10-CM

## 2017-08-31 DIAGNOSIS — J441 Chronic obstructive pulmonary disease with (acute) exacerbation: Secondary | ICD-10-CM | POA: Diagnosis not present

## 2017-08-31 DIAGNOSIS — Z9221 Personal history of antineoplastic chemotherapy: Secondary | ICD-10-CM

## 2017-08-31 DIAGNOSIS — R0602 Shortness of breath: Secondary | ICD-10-CM

## 2017-08-31 DIAGNOSIS — Z9981 Dependence on supplemental oxygen: Secondary | ICD-10-CM

## 2017-08-31 DIAGNOSIS — Z85038 Personal history of other malignant neoplasm of large intestine: Secondary | ICD-10-CM

## 2017-08-31 DIAGNOSIS — Z79899 Other long term (current) drug therapy: Secondary | ICD-10-CM

## 2017-08-31 DIAGNOSIS — J9602 Acute respiratory failure with hypercapnia: Secondary | ICD-10-CM

## 2017-08-31 DIAGNOSIS — R0600 Dyspnea, unspecified: Secondary | ICD-10-CM

## 2017-08-31 DIAGNOSIS — F17211 Nicotine dependence, cigarettes, in remission: Secondary | ICD-10-CM

## 2017-08-31 LAB — BASIC METABOLIC PANEL
ANION GAP: 11 (ref 5–15)
BUN: 31 mg/dL — ABNORMAL HIGH (ref 8–23)
CO2: 32 mmol/L (ref 22–32)
CREATININE: 0.92 mg/dL (ref 0.61–1.24)
Calcium: 8.5 mg/dL — ABNORMAL LOW (ref 8.9–10.3)
Chloride: 95 mmol/L — ABNORMAL LOW (ref 98–111)
Glucose, Bld: 220 mg/dL — ABNORMAL HIGH (ref 70–99)
Potassium: 4.2 mmol/L (ref 3.5–5.1)
SODIUM: 138 mmol/L (ref 135–145)

## 2017-08-31 LAB — CBC
HCT: 41.7 % (ref 39.0–52.0)
HEMOGLOBIN: 13 g/dL (ref 13.0–17.0)
MCH: 30.5 pg (ref 26.0–34.0)
MCHC: 31.2 g/dL (ref 30.0–36.0)
MCV: 97.9 fL (ref 78.0–100.0)
PLATELETS: 193 10*3/uL (ref 150–400)
RBC: 4.26 MIL/uL (ref 4.22–5.81)
RDW: 12.7 % (ref 11.5–15.5)
WBC: 12 10*3/uL — AB (ref 4.0–10.5)

## 2017-08-31 LAB — GLUCOSE, CAPILLARY: Glucose-Capillary: 214 mg/dL — ABNORMAL HIGH (ref 70–99)

## 2017-08-31 LAB — C-REACTIVE PROTEIN: CRP: <0.8 mg/dL (ref <1.0)

## 2017-08-31 LAB — I-STAT CG4 LACTIC ACID, ED: Lactic Acid, Venous: 1.28 mmol/L (ref 0.5–1.9)

## 2017-08-31 MED ORDER — ARIPIPRAZOLE 5 MG PO TABS
5.0000 mg | ORAL_TABLET | Freq: Every day | ORAL | Status: DC
  Administered 2017-08-31 – 2017-09-01 (×2): 5 mg via ORAL
  Filled 2017-08-31 (×4): qty 1

## 2017-08-31 MED ORDER — ORAL CARE MOUTH RINSE
15.0000 mL | OROMUCOSAL | Status: DC
  Administered 2017-08-31 (×2): 15 mL via OROMUCOSAL

## 2017-08-31 MED ORDER — ENOXAPARIN SODIUM 40 MG/0.4ML ~~LOC~~ SOLN
40.0000 mg | SUBCUTANEOUS | Status: DC
  Administered 2017-08-31 – 2017-09-01 (×2): 40 mg via SUBCUTANEOUS
  Filled 2017-08-31 (×2): qty 0.4

## 2017-08-31 MED ORDER — METHYLPREDNISOLONE SODIUM SUCC 125 MG IJ SOLR
60.0000 mg | Freq: Once | INTRAMUSCULAR | Status: AC
  Administered 2017-08-31: 60 mg via INTRAVENOUS
  Filled 2017-08-31: qty 2

## 2017-08-31 MED ORDER — CHLORHEXIDINE GLUCONATE 0.12% ORAL RINSE (MEDLINE KIT)
15.0000 mL | Freq: Two times a day (BID) | OROMUCOSAL | Status: DC
  Administered 2017-08-31 – 2017-09-01 (×2): 15 mL via OROMUCOSAL

## 2017-08-31 MED ORDER — MOMETASONE FURO-FORMOTEROL FUM 100-5 MCG/ACT IN AERO
2.0000 | INHALATION_SPRAY | Freq: Two times a day (BID) | RESPIRATORY_TRACT | Status: DC
  Filled 2017-08-31: qty 8.8

## 2017-08-31 MED ORDER — IOHEXOL 300 MG/ML  SOLN
100.0000 mL | Freq: Once | INTRAMUSCULAR | Status: AC | PRN
  Administered 2017-08-31: 100 mL via INTRAVENOUS

## 2017-08-31 MED ORDER — PREDNISONE 20 MG PO TABS
40.0000 mg | ORAL_TABLET | Freq: Every day | ORAL | Status: DC
  Administered 2017-09-01: 40 mg via ORAL
  Filled 2017-08-31: qty 2

## 2017-08-31 MED ORDER — IPRATROPIUM-ALBUTEROL 0.5-2.5 (3) MG/3ML IN SOLN
3.0000 mL | Freq: Four times a day (QID) | RESPIRATORY_TRACT | Status: AC
  Administered 2017-08-31 (×4): 3 mL via RESPIRATORY_TRACT
  Filled 2017-08-31 (×4): qty 3

## 2017-08-31 MED ORDER — MOMETASONE FURO-FORMOTEROL FUM 100-5 MCG/ACT IN AERO
2.0000 | INHALATION_SPRAY | Freq: Two times a day (BID) | RESPIRATORY_TRACT | Status: DC
  Administered 2017-08-31 – 2017-09-01 (×2): 2 via RESPIRATORY_TRACT
  Filled 2017-08-31: qty 8.8

## 2017-08-31 MED ORDER — LORAZEPAM 2 MG/ML IJ SOLN
0.5000 mg | Freq: Once | INTRAMUSCULAR | Status: AC
  Administered 2017-08-31: 0.5 mg via INTRAVENOUS
  Filled 2017-08-31: qty 1

## 2017-08-31 MED ORDER — ESCITALOPRAM OXALATE 10 MG PO TABS
10.0000 mg | ORAL_TABLET | Freq: Every day | ORAL | Status: DC
  Administered 2017-08-31 – 2017-09-01 (×2): 10 mg via ORAL
  Filled 2017-08-31 (×2): qty 1

## 2017-08-31 MED ORDER — ORAL CARE MOUTH RINSE
15.0000 mL | Freq: Every day | OROMUCOSAL | Status: DC
  Administered 2017-09-01 (×3): 15 mL via OROMUCOSAL

## 2017-08-31 NOTE — Progress Notes (Signed)
   Subjective: Taylor [name] feels that his difficulty breathing has improved since the time of admission. He has been having abdominal discomfort and nausea since completion of the oral contrast. Troponin became elevated this morning, he denies any new chest pain or shortness of breath.   Objective:  Vital signs in last 24 hours: Vitals:   08/31/17 0406 08/31/17 0517 08/31/17 0812 08/31/17 1103  BP: (!) 84/60  107/80   Pulse: 85     Resp: (!) 25  (!) 21   Temp:  97.6 F (36.4 C) 97.8 F (36.6 C)   TempSrc:  Axillary Axillary   SpO2: 93%  97% 96%  Weight:      Height:       General: nervous appearing, no acute distress  Pulm: lung sounds are very distant, good air movement, some small amount of intercostal accessory muscle use but speaking in full sentences, on nasal canula at 3 liters  Cardiac: regular rate and rhythm, no murmur appreciated, no calf tenderness or peripheral edema  GI: the abdomen is soft, non tender, non distended   Assessment/Plan:  Taylor [name] is a 62 year old Caucasian man with history of COPD on chronic 3L [place], bipolar 1 disorder, anxiety disorder, steroid-induced hyperglycemia, untreated prostate cancer and history of stage III colon cancer s/p chemotherapy and surgery in 2013 here for COPD exacerbation.  COPD exacerbation: Patient with history of advanced oxygen dependent COPD on chronic 3L home [place] multiple hospital admissions presenting with worsening shortness of breath.  Recent admission from 8/4-8/6 successfully managed with nebulizer, prednisone, BiPAP, CPAP and appropriately transitioning to home 3L [place].  He was discharged with [facility], albuterol and 14-day prednisone taper but was unable to pick up medications due to lack of social support.  -transitioned from BiPAP to home 3 L [place]  -Continue DuoNeb scheduled q6h, home Dulera BID , prednisone 40 mg daily plan for two week course  - will monitor CBG and try to avoid starting insulin therapy  History of  prostate cancer  - elevated PSA at last hospitalization  - will continue workup while he is being managed for COPD exacerbation and plans for medication access are being addressed  - will need urology referral at discharge   Bipolar 1 disorder: Continue Abilify 5 mg daily  Anxiety disorder: Continue Lexapro 10 mg daily  Social: homeless and without access to medications  - social work and care management consulted   Dispo: Anticipated discharge in approximately 1-2 day(s) when we have established reliable access to meds   Ledell Noss, MD 08/31/2017, 11:13 AM Pager: [phone]

## 2017-08-31 NOTE — Progress Notes (Signed)
Patient transported to 5W 11 without incidence. Report giving to receiving RT

## 2017-08-31 NOTE — Progress Notes (Signed)
Patient is resting on 3L [place] currently with no respiratory distress noted. BIPAP is not needed at this time. RT will monitor as needed.

## 2017-08-31 NOTE — H&P (Addendum)
Date: 08/31/2017               Patient Name:  Taylor [name] MRN: [id number]  DOB: 04-21-1955 Age / Sex: 62 y.o., male   PCP: [facility]         Medical Service: Internal Medicine Teaching Service         Attending Physician: Dr. Annia Belt, MD    First Contact: Dr. Donne Hazel  Pager: [phone]  Second Contact: Dr. Hetty Ely Pager: [phone]       After Hours (After 5p/  First Contact Pager: [phone]  weekends / holidays): Second Contact Pager: [phone]   Chief Complaint: "Difficulty breathing"  History of Present Illness: Taylor [name] is a 62 year old Caucasian man with history of COPD on chronic 3L [place], bipolar 1 disorder, anxiety disorder, steroid-induced hyperglycemia, untreated prostate cancer and history of stage III colon cancer sp chemotherapy and surgery in 2013 presented with worsening shortness of breath.  He reports of feeling extremely short of breath yesterday with several episodes of cough productive of some green sputum but denies increased sputum production.  He however denies fevers, chills.  He did report a feeling anxiety attack yesterday as he has in the past.  Currently, he endorses chest tightness but denies radiating pain, nausea, vomiting vomiting, diaphoresis.  He was recently admitted from 8/4 to 8/6 for COPD exacerbation due to difficulty obtaining his medications.  During this admission he required BiPAP, CPAP and successfully transition to home 3L [place] in addition to Atrovent nebulizer, Dulera, levalbuterol nebulizer and prednisone.  He was discharged with [facility], albuterol and 14-day prednisone taper which he could not obtain and states he does not have support or anybody to pick up medications.   Since discharge he has had 1 ED visits on 08/29/2017 with similar complaints of shortness of breath requiring treatment for acute respiratory distress secondary to COPD exacerbation.  ED course: BP stable, tachypneic with RR range 21-34,  tachycardia with HR range 87-118 afebrile.  VBG showed respiratory acidosis with with PCO2 80.6-->68.6, Normal lactic acid, leukocytosis with WBC of 13.6, negative troponin, CXR showed emphysema at the upper lobe with no acute cardiopulmonary process, EKG showed sinus tachycardia.  He was placed on a BiPAP with SPO2 of 98%.  Meds:  Current Meds  Medication Sig  . lovastatin (MEVACOR) 20 MG tablet Take 1 tablet (20 mg total) by mouth at bedtime.  . mometasone-formoterol (DULERA) 100-5 MCG/ACT AERO Inhale 2 puffs into the lungs 2 (two) times daily.     Allergies: Allergies as of 08/30/2017 - Review Complete 08/30/2017  Allergen Reaction Noted  . Codeine Nausea And Vomiting 04/02/2011   Past Medical History:  Diagnosis Date  . Asthma   . Bipolar 1 disorder ([place]) 02/05/2012  . Colon cancer ([place]) 04/11/11   adenocarcinoma of colon, 7/19 nodes pos.FINISHED CHEMO/DR. SHERRILL  . COPD (chronic obstructive pulmonary disease) ([place])    SMOKER  . Depression   . Emphysema of lung ([place])   . Full dentures   . Hemorrhoids   . On home oxygen therapy    "3L; 24/7" (05/29/2017)  . Pneumonia ~ 2016   "double pneumonia"  . Prostate cancer ([place])    Gleason score = 7, supposed to have radiation therapy but he has not followed up (05/29/2017)  . Rib fractures    hx of    Family History:  Father with heart failure  Social History:  Previous heavy cigarette use.  Smoked 1  pack/day for many years.  Last use 1 month ago. Denies current EtOH or illicit drug use.  Review of Systems: A complete ROS was negative except as per HPI.   Physical Exam: Blood pressure 107/90, pulse 96, temperature (!) 97.4 F (36.3 C), temperature source Axillary, resp. rate (!) 26, height 5\' 11"  (1.803 m), weight 72.5 kg, SpO2 98 %.  Physical Exam  Constitutional: He appears distressed (secondary to dypnea and BiPAP mask).  HENT:  Head: Normocephalic and atraumatic.  Eyes: Left eye exhibits no discharge. No scleral  icterus.  Cardiovascular: Regular rhythm, S1 normal, S2 normal and normal heart sounds. Tachycardia present. Exam reveals no gallop, no S3, no S4 and no distant heart sounds.  No murmur heard.  No systolic murmur is present.  No diastolic murmur is present. Pulmonary/Chest: No accessory muscle usage. He is in respiratory distress. He has wheezes in the right upper field, the right middle field, the right lower field, the left upper field, the left middle field and the left lower field. He has no rhonchi. He has rales (diffuse).  Abdominal: Soft. Bowel sounds are normal. He exhibits distension. There is no tenderness.  Musculoskeletal: He exhibits no edema or tenderness.  Neurological: He is alert.  Psychiatric: Mood and affect normal.    EKG: personally reviewed my interpretation is sinus tachycardia  CXR: personally reviewed my interpretation is upper lobe emphysema.  Assessment & Plan by Problem: Active Problems:   COPD with acute exacerbation [facility])  Taylor [name] is a 62 year old Caucasian man with history of COPD on chronic 3L [place], bipolar 1 disorder, anxiety disorder, steroid-induced hyperglycemia, untreated prostate cancer and history of stage III colon cancer s/p chemotherapy and surgery in 2013 here for COPD exacerbation.  COPD exacerbation: Patient with history of advanced oxygen dependent COPD on chronic 3L home [name] multiple hospital admissions presenting with worsening shortness of breath.  Recent admission from 8/4-8/6 successfully managed with nebulizer, prednisone, BiPAP, CPAP and appropriately transitioning to home 3L [place].  He was discharged with [facility], albuterol and 14-day prednisone taper but was unable to pick up medications due to lack of social support. He has had 1 subsequent ED visit since discharge on 08/29/2017 for acute hypercapnic respiratory distress, however ED provider reports patient left AMA.   Worsening shortness of breath on admission today.  Denies fevers, chills  but admits to cough productive of some green sputum.  Tachypneic to 34, tachycardic to 118, afebrile, VBG showed respiratory acidosis with hypercapnia, mild leukocytosis with WBC of 13.6, CXR consistent with emphysema of upper lobes, placed on BiPAP in the ED with SPO2 of 98% -Currently on BiPAP -Once stable, will transition to CPAP -Continue DuoNeb, Dulera, prednisone 40 mg daily -Follow up a.m CRP to guide initiation of antibiotic therapy -Follow-up a.m. CBG  Bipolar 1 disorder: Continue Abilify 5 mg daily  Anxiety disorder: Continue Lexapro 10 mg daily  Social: Patient seems to like this adequately community support and thus have been unable to manage his medical conditions appropriately.  States that he lives at a board-and-care however previous notes have reported that he lives in a tent.  Without the appropriate social support, he will continue to have recurrent admission and worsening office medical condition. -Please consider social worker and case management consult to assist patient.   FEN: No fluids, regular diet, replace electrolytes as needed, VTE prophylaxis: Lovenox 40 mg SQ CODE STATUS: Full code  Dispo: Admit patient to observation with expected length of stay less than 2 midnight  Signed: Jean Rosenthal, MD 08/31/2017, 1:10 AM  Pager: [phone] IMTS PGY-1

## 2017-08-31 NOTE — Progress Notes (Signed)
PT weaned off BIPAP to home level 3L [place]. No signs of distress noted and pt informed to call staff if he gets SOB or needs to go back on BIPAP. VS stable and RT will continue to monitor.

## 2017-09-01 ENCOUNTER — Other Ambulatory Visit: Payer: Self-pay | Admitting: Pharmacist

## 2017-09-01 ENCOUNTER — Ambulatory Visit: Payer: Self-pay

## 2017-09-01 ENCOUNTER — Observation Stay (HOSPITAL_COMMUNITY): Payer: Medicaid Other

## 2017-09-01 DIAGNOSIS — F319 Bipolar disorder, unspecified: Secondary | ICD-10-CM | POA: Diagnosis not present

## 2017-09-01 DIAGNOSIS — Z7952 Long term (current) use of systemic steroids: Secondary | ICD-10-CM

## 2017-09-01 DIAGNOSIS — F419 Anxiety disorder, unspecified: Secondary | ICD-10-CM | POA: Diagnosis not present

## 2017-09-01 DIAGNOSIS — Z7951 Long term (current) use of inhaled steroids: Secondary | ICD-10-CM

## 2017-09-01 DIAGNOSIS — J441 Chronic obstructive pulmonary disease with (acute) exacerbation: Secondary | ICD-10-CM | POA: Diagnosis not present

## 2017-09-01 DIAGNOSIS — C61 Malignant neoplasm of prostate: Secondary | ICD-10-CM | POA: Diagnosis not present

## 2017-09-01 LAB — GLUCOSE, CAPILLARY: GLUCOSE-CAPILLARY: 121 mg/dL — AB (ref 70–99)

## 2017-09-01 MED ORDER — TECHNETIUM TC 99M MEDRONATE IV KIT
21.1000 | PACK | Freq: Once | INTRAVENOUS | Status: AC | PRN
  Administered 2017-09-01: 21.1 via INTRAVENOUS

## 2017-09-01 MED ORDER — ARIPIPRAZOLE 5 MG PO TABS: 5.0000 mg | ORAL_TABLET | Freq: Every day | ORAL | 0 refills | Status: DC

## 2017-09-01 MED ORDER — MOMETASONE FURO-FORMOTEROL FUM 100-5 MCG/ACT IN AERO: 2.0000 | INHALATION_SPRAY | Freq: Every day | RESPIRATORY_TRACT | 0 refills | Status: DC

## 2017-09-01 MED ORDER — IPRATROPIUM-ALBUTEROL 0.5-2.5 (3) MG/3ML IN SOLN
3.0000 mL | Freq: Four times a day (QID) | RESPIRATORY_TRACT | Status: DC
  Administered 2017-09-01 (×2): 3 mL via RESPIRATORY_TRACT
  Filled 2017-09-01 (×3): qty 3

## 2017-09-01 MED ORDER — ALBUTEROL SULFATE HFA 108 (90 BASE) MCG/ACT IN AERS: 1.0000 | INHALATION_SPRAY | Freq: Four times a day (QID) | RESPIRATORY_TRACT | 0 refills | Status: DC | PRN

## 2017-09-01 MED ORDER — TIOTROPIUM BROMIDE MONOHYDRATE 18 MCG IN CAPS: 18.0000 ug | ORAL_CAPSULE | Freq: Every day | RESPIRATORY_TRACT | 0 refills | Status: DC

## 2017-09-01 MED ORDER — PREDNISONE 20 MG PO TABS: 20.0000 mg | ORAL_TABLET | Freq: Every day | ORAL | 0 refills | Status: DC

## 2017-09-01 MED FILL — ARIPiprazole 5 MG TABS: 5 | 30 days supply | Qty: 30 | Fill #0

## 2017-09-01 MED FILL — predniSONE 20 MG TABS: 20 | 24 days supply | Qty: 24 | Fill #0

## 2017-09-01 MED FILL — PROVENTIL HFA 108 (90 BASE): 108 (90 BAS | 25 days supply | Qty: 7 | Fill #0

## 2017-09-01 MED FILL — DULERA 100 MCG/5 MCG INH: 100-5 | 30 days supply | Qty: 13 | Fill #0

## 2017-09-01 MED FILL — SPIRIVA 18 MCG CP-HANDIHALE: 18 | 30 days supply | Qty: 30 | Fill #0

## 2017-09-01 NOTE — Progress Notes (Signed)
Waiting on pts pharmacist to bring home medicine to pt's room. Will proceed with discharge and schedule PTAR if medicine comes at a reasonable time, if not possible today, we will proceed with discharge tomorrow morning to [facility] group home.

## 2017-09-01 NOTE — Progress Notes (Signed)
CSW alerted by RN that patient has a DSS worker at bedside that wants to speak to [place]. CSW met with DSS Worker, [employer], and discussed patient. Patient has open DSS case for apparent self-neglect; he is alerted, oriented, but chooses not to care for himself. He does not pick up his medications even though he has Medicaid and he does not go to doctor's appointments even though he could use Medicaid transportation.  Patient is from [place] (he is not homeless), and is able to return. DSS will continue to follow for support.  CSW to follow.  Laveda Abbe, [place] Clinical Social Worker [phone]

## 2017-09-01 NOTE — Progress Notes (Addendum)
Taylor [name] to be D/C'd [facility]  per MD order.  Discussed with the patient and all questions fully answered.  VSS, Skin clean, dry and intact without evidence of skin break down, no evidence of skin tears noted. IV catheter discontinued intact. Site without signs and symptoms of complications. Dressing and pressure applied.  An After Visit Summary was printed and given to the patient. Patient received all medicine by inpatient pharmacy.  D/c education completed with patient including follow up instructions, medication list, d/c activities limitations if indicated, with other d/c instructions as indicated by MD - patient able to verbalize understanding, all questions fully answered.   Patient instructed to return to ED, call 911, or call MD for any changes in condition.   Patient waiting on PTAR to transport to [place] 09/01/2017 6:50 PM

## 2017-09-01 NOTE — Discharge Summary (Signed)
Name: Taylor [name] MRN: [id number] DOB: [birth year] 62 y.o. PCP: [facility]  Date of Admission: 08/30/2017  9:37 PM Date of Discharge:  Attending Physician: Annia Belt, MD  Discharge Diagnosis: 1. COPD Exacerbation   Discharge Medications: Allergies as of 09/01/2017      Reactions   Codeine Nausea And Vomiting      Medication List    TAKE these medications   albuterol 108 (90 Base) MCG/ACT inhaler Commonly known as:  PROVENTIL HFA;VENTOLIN HFA Inhale 2 puffs into the lungs every 6 (six) hours as needed for wheezing or shortness of breath. What changed:  Another medication with the same name was added. Make sure you understand how and when to take each.   albuterol (5 MG/ML) 0.5% nebulizer solution Commonly known as:  PROVENTIL Take 1 mL (5 mg total) by nebulization every 6 (six) hours as needed for wheezing or shortness of breath. What changed:  Another medication with the same name was added. Make sure you understand how and when to take each.   albuterol 108 (90 Base) MCG/ACT inhaler Commonly known as:  PROVENTIL HFA;VENTOLIN HFA Inhale 1-2 puffs into the lungs every 6 (six) hours as needed for wheezing or shortness of breath. What changed:  You were already taking a medication with the same name, and this prescription was added. Make sure you understand how and when to take each.   ARIPiprazole 5 MG tablet Commonly known as:  ABILIFY Take 1 tablet (5 mg total) by mouth daily with breakfast. What changed:  Another medication with the same name was added. Make sure you understand how and when to take each.   ARIPiprazole 5 MG tablet Commonly known as:  ABILIFY Take 1 tablet (5 mg total) by mouth daily. What changed:  You were already taking a medication with the same name, and this prescription was added. Make sure you understand how and when to take each.   escitalopram 10 MG tablet Commonly known as:  LEXAPRO Take 1 tablet (10 mg  total) by mouth daily.   lovastatin 20 MG tablet Commonly known as:  MEVACOR Take 1 tablet (20 mg total) by mouth at bedtime.   mometasone-formoterol 100-5 MCG/ACT Aero Commonly known as:  DULERA Inhale 2 puffs into the lungs 2 (two) times daily. What changed:  Another medication with the same name was added. Make sure you understand how and when to take each.   mometasone-formoterol 100-5 MCG/ACT Aero Commonly known as:  DULERA Inhale 2 puffs into the lungs daily. What changed:  You were already taking a medication with the same name, and this prescription was added. Make sure you understand how and when to take each.   OXYGEN Inhale 3 L into the lungs.   predniSONE 20 MG tablet Commonly known as:  DELTASONE 3 tabs po daily x 3 days, then 2 tabs x 3 days, then 1.5 tabs x 3 days, then 1 tab x 3 days, then 0.5 tabs x 3 days What changed:  Another medication with the same name was added. Make sure you understand how and when to take each.   predniSONE 20 MG tablet Commonly known as:  DELTASONE Take 1 tablet (20 mg total) by mouth daily with breakfast for 24 days. What changed:  You were already taking a medication with the same name, and this prescription was added. Make sure you understand how and when to take each.   tiotropium 18 MCG inhalation capsule Commonly known as:  SPIRIVA Place 1 capsule (18 mcg total) into inhaler and inhale daily. What changed:  Another medication with the same name was added. Make sure you understand how and when to take each.   tiotropium 18 MCG inhalation capsule Commonly known as:  SPIRIVA Place 1 capsule (18 mcg total) into inhaler and inhale daily. What changed:  You were already taking a medication with the same name, and this prescription was added. Make sure you understand how and when to take each.       Disposition and follow-up:   Taylor [name] was discharged from [facility] in Stable condition.  At the  hospital follow up visit please address:  1.  Adherence to medications: Given bedside delivery of Prednisone 2 week taper, Dulera, Spiriva, and Albuterol. Please make sure patient has refills and has a plan to pick up future medicines at his pharmacy. He may also benefit from chronic, low dose Prednisone due to the severity of his COPD.  Bipolar/anxiety: Takes Abilify and Lexapro. His COPD exacerbations likely have a psych component due to medication non-adherence. Abilify was delivered to patient's bedside at discharge but Lexapro was not on IM Program list. Please make sure he has a prescription for these meds.        Prostate Cancer management: PSA 38. Diagnosed a year ago while in prison. Lost to f/u. We placed a urology referral. Please ask Lela about the status of this. CT a/p and NM Bone Scan without evidence of metastatic disease.   2.  Labs / imaging needed at time of follow-up: none  3.  Pending labs/ test needing follow-up: none   Follow-up Appointments: Follow-up Information    [place]. Go to.   Why:  Please go to your already scheduled appt on Monday, August 19th at 1:15pm Contact information: [address] [place] Hospital Course by problem list:  Taylor [name] is a 62 year old Caucasian man with history of COPD on chronic 3L [place],bipolar1disorder, anxiety disorder, steroid-induced hyperglycemia, untreated prostate cancer and history of stage III colon cancer s/pchemotherapy and surgeryin 2013here for COPD exacerbation.  1. COPD exacerbation:Patient with history of advanced oxygen dependent COPD [place]3L [place] presenting with worsening shortness of breath. Recent admission from 8/4-8/6successfully managed with nebulizer, prednisone,BiPAP, CPAP and appropriately transitioning to home 3L [place].He was discharged with [facility], albuterol and 14-day prednisone  taper but was unable to pick up medications due to lack of social support. On presentation, he initially required BiPAP but was quickly transitioned to home 3L . He was discharged with  Dulera BID, albuterol, Spiriva, and prednisone 40 mg daily plan for two week taper (60mg  x 3 days, then 40mg  x 3 days, then 30mg  x 3 days, then 20mg  x 3 days, then 10mg  x 3 days).   2. History of prostate cancer: Diagnosed while in prison and never followed up. PSA 38 on 08/24/17. Initiated workup while inpatient in order to expedite outpatient management. CT abdomen/pelvis without evidence of metastatic disease. Nuclear medicine bone scan negative for bony mets. Urology referral placed at discharge.  3. Social: From [place]. Patient has open DSS case for apparent self-neglect.  He is alerted and oriented but chooses not to care for himself. He does not pick up his medications even though he has Medicaid and he does not go to doctor's appointments even though he could use Medicaid  transportation. At discharge, we arranged bedside delivery of medications and set up home health nurse to check on his medication adherence.   Bipolar 1 disorder:Continue Abilify 5 mg daily Anxiety disorder:Continue Lexapro 10 mg daily   Discharge Vitals:   BP 121/85 (BP Location: Right Arm)   Pulse 73   Temp 98.4 F (36.9 C) (Oral)   Resp 20   Ht 5\' 5"  (1.651 m)   Wt 71.1 kg   SpO2 96%   BMI 26.08 kg/m   Pertinent Labs, Studies, and Procedures:  none  Discharge Instructions: Discharge Instructions    Ambulatory referral to Urology   Complete by:  As directed    Prostate cancer, untreated   Call MD for:  difficulty breathing, headache or visual disturbances   Complete by:  As directed    Call MD for:  extreme fatigue   Complete by:  As directed    Call MD for:  hives   Complete by:  As directed    Call MD for:  persistant dizziness or light-headedness   Complete by:  As directed    Call MD for:   persistant nausea and vomiting   Complete by:  As directed    Call MD for:  redness, tenderness, or signs of infection (pain, swelling, redness, odor or green/yellow discharge around incision site)   Complete by:  As directed    Call MD for:  severe uncontrolled pain   Complete by:  As directed    Call MD for:  temperature >100.4   Complete by:  As directed    Diet - low sodium heart healthy   Complete by:  As directed    Diet - low sodium heart healthy   Complete by:  As directed    Discharge instructions   Complete by:  As directed    [name],   It has been a pleasure working with you and we are glad you're feeling better. You were hospitalized for COPD exacerbation.   For your breathing, please take the Prednisone taper. It is roughly a two week course. Please also use Spiriva and Dulera inhalers once every day. The albuterol inhaler and nebulizer solution is only for use as needed.   I have scheduled an appointment for you in the [facility] on Monday, August 19th at 1:15pm.   If your symptoms worsen or you develop new symptoms, please seek medical help whether it is your primary care provider or emergency department.  If you have any questions about this hospitalization please call [phone].   Increase activity slowly   Complete by:  As directed    Increase activity slowly   Complete by:  As directed       Signed: Isabelle Course, MD 09/01/2017, 7:40 PM   Pager: [phone]

## 2017-09-01 NOTE — Progress Notes (Signed)
Medical attending discharge note: I personally examined this patient on the day of discharge and I attest to the accuracy of the discharge evaluation and plan as recorded in the final progress note by resident physician Dr Francesco Runner and which will be subsequently documented in her discharge summary.  Short interval readmission for this 62 year old man with advanced oxygen dependent airway disease complicated by behavioral health issues with anxiety attacks precipitating decompensations in his respiratory status. He lives in a boardinghouse.  No family support.  Despite detailed information given to him by Dr. Donne Hazel at time of recent discharge and assurances by the patient that he could procure his necessary medications, he did not and returned to the emergency department complaining of increased dyspnea.  He was put on temporary BiPAP which was able to be quickly removed as his condition stabilized rapidly. We took this opportunity to do a staging evaluation of prostate cancer diagnosed per patient history while he was in prison about a year ago.  PSA during recent admission was elevated at 38.  He had no bone complaints.  CT scan of the abdomen and pelvis showed no evidence for metastatic disease below the belt.  Prostate gland appeared unremarkable.  A nuclear medicine bone scan will be done prior to discharge to complete his staging evaluation.  Disposition: Condition stable at time of discharge Our social workers met with the patient and explored outpatient resources.  Attempts are being made to see if the patient's pharmacy can deliver his medications. He will return to the [facility] group home. Follow-up in our general medical clinic and a urology referral will be made. There were no complications

## 2017-09-01 NOTE — Progress Notes (Signed)
   Subjective: Mr. Caba feels a little better this morning. His shortness of breath has not completely resolved. His appetite is good. He was happy to hear that his CT scan did not show any evidence of metastatic disease. Plan for nuclear medicine bone scan today, bedside delivery of medications, and then discharge.    Objective:  Vital signs in last 24 hours: Vitals:   08/31/17 2042 08/31/17 2159 09/01/17 0520 09/01/17 0836  BP:  129/80 119/82   Pulse:  (!) 130 78   Resp:  18 (!) 24   Temp:  99.1 F (37.3 C) 98.4 F (36.9 C)   TempSrc:  Oral Oral   SpO2: 97% 95% 95% 98%  Weight:      Height:       General: resting comfortably, no acute distress  Pulm: lung sounds are very distant, decreased air movement, slight expiratory wheezes, on nasal canula at 3 liters  Cardiac: regular rate and rhythm, no murmur appreciated, no peripheral edema  GI: the abdomen is soft, non tender, non distended   Assessment/Plan:  Mr. Mecham is a 62 year old Caucasian man with history of COPD on chronic 3L [place], bipolar 1 disorder, anxiety disorder, steroid-induced hyperglycemia, untreated prostate cancer and history of stage III colon cancer s/p chemotherapy and surgery in 2013 here for COPD exacerbation.  COPD exacerbation: Patient with history of advanced oxygen dependent COPD on chronic 3L home [place] multiple hospital admissions presenting with worsening shortness of breath.  Recent admission from 8/4-8/6 successfully managed with nebulizer, prednisone, BiPAP, CPAP and appropriately transitioning to home 3L [place].  He was discharged with [facility], albuterol and 14-day prednisone taper but was unable to pick up medications due to lack of social support.  -transitioned from BiPAP to home 3 L [place]  -Continue DuoNeb scheduled q6h, home Dulera BID , prednisone 40 mg daily plan for two week course  - will monitor CBG and try to avoid starting insulin therapy  History of prostate cancer  - elevated PSA at last  hospitalization  - will continue workup while he is being managed for COPD exacerbation and plans for medication access are being addressed  - CT no evidence of metastatic disease - will need urology referral at discharge   Bipolar 1 disorder: Continue Abilify 5 mg daily  Anxiety disorder: Continue Lexapro 10 mg daily  Social: homeless and without access to medications  - social work and care management consulted   Dispo: Anticipated discharge in approximately 1-2 day(s) when we have established reliable access to meds   Isabelle Course, MD 09/01/2017, 11:06 AM Pager: [phone]

## 2017-09-01 NOTE — Progress Notes (Addendum)
CSW received call from Arbutus Leas with [facility] who is following the patient in the community. Per Joelene Millin, they have evaluated the home that he is in and he does not receive any support there, it is more like a boarding house. Per Joelene Millin, the patient states that his sisters can help him, but his sisters are unable to provide any assistance. They have been trying to get him set up with some additional services, and were hopeful for assistance with placement. CSW informed Joelene Millin that he does not qualify for SNF at this time, there is no need for skilled placement. Patient may qualify for ALF, but it will depend on each facility's admission criteria and they will have to do an in person assessment.   Joelene Millin was hopeful for home health services. CSW spoke with MD about possibility of sending a nurse out to assist the patient with meds. Per MD, they are also looking at getting their pharmacist to provide delivery of the patient's medications for him to assist.   No further CSW needs at this time. Patient will discharge home, [address] [place]. CSW signing off.  Laveda Abbe, [place] Clinical Social Worker [phone]

## 2017-09-02 NOTE — Progress Notes (Signed)
Patient discharge and transported by [facility] to [place]. Given all belongings to the patient.

## 2017-09-02 NOTE — Progress Notes (Signed)
Help patient with medication access through [facility], who was able to fill all except Lexapro due to being filled at [place]. Patient states he is not currently taking Lexapro. medications were reviewed with the patient, including name, instructions, indication, goals of therapy, potential side effects, importance of adherence, and safe use. Patient verbalized understanding by repeating back information and was also provided an information handout.

## 2017-09-04 ENCOUNTER — Emergency Department (HOSPITAL_COMMUNITY): Payer: Medicaid Other

## 2017-09-04 ENCOUNTER — Encounter (HOSPITAL_COMMUNITY): Payer: Self-pay | Admitting: Emergency Medicine

## 2017-09-04 ENCOUNTER — Inpatient Hospital Stay (HOSPITAL_COMMUNITY)
Admission: EM | Admit: 2017-09-04 | Discharge: 2017-09-07 | DRG: 190 | Disposition: A | Discharge status: Home / Self Care | Payer: Medicaid Other | Attending: Family Medicine | Admitting: Family Medicine

## 2017-09-04 DIAGNOSIS — Z8249 Family history of ischemic heart disease and other diseases of the circulatory system: Secondary | ICD-10-CM

## 2017-09-04 DIAGNOSIS — Z9981 Dependence on supplemental oxygen: Secondary | ICD-10-CM

## 2017-09-04 DIAGNOSIS — E119 Type 2 diabetes mellitus without complications: Secondary | ICD-10-CM | POA: Diagnosis present

## 2017-09-04 DIAGNOSIS — J439 Emphysema, unspecified: Principal | ICD-10-CM | POA: Diagnosis present

## 2017-09-04 DIAGNOSIS — E872 Acidosis: Secondary | ICD-10-CM | POA: Diagnosis present

## 2017-09-04 DIAGNOSIS — Z885 Allergy status to narcotic agent status: Secondary | ICD-10-CM | POA: Diagnosis not present

## 2017-09-04 DIAGNOSIS — Z9221 Personal history of antineoplastic chemotherapy: Secondary | ICD-10-CM | POA: Diagnosis not present

## 2017-09-04 DIAGNOSIS — J9622 Acute and chronic respiratory failure with hypercapnia: Secondary | ICD-10-CM | POA: Diagnosis present

## 2017-09-04 DIAGNOSIS — I493 Ventricular premature depolarization: Secondary | ICD-10-CM | POA: Diagnosis present

## 2017-09-04 DIAGNOSIS — Z87891 Personal history of nicotine dependence: Secondary | ICD-10-CM

## 2017-09-04 DIAGNOSIS — Z85038 Personal history of other malignant neoplasm of large intestine: Secondary | ICD-10-CM

## 2017-09-04 DIAGNOSIS — J441 Chronic obstructive pulmonary disease with (acute) exacerbation: Secondary | ICD-10-CM | POA: Diagnosis present

## 2017-09-04 DIAGNOSIS — F319 Bipolar disorder, unspecified: Secondary | ICD-10-CM | POA: Diagnosis present

## 2017-09-04 DIAGNOSIS — C61 Malignant neoplasm of prostate: Secondary | ICD-10-CM | POA: Diagnosis present

## 2017-09-04 DIAGNOSIS — Z79899 Other long term (current) drug therapy: Secondary | ICD-10-CM

## 2017-09-04 DIAGNOSIS — J9621 Acute and chronic respiratory failure with hypoxia: Secondary | ICD-10-CM | POA: Diagnosis present

## 2017-09-04 DIAGNOSIS — E785 Hyperlipidemia, unspecified: Secondary | ICD-10-CM | POA: Diagnosis present

## 2017-09-04 DIAGNOSIS — Z801 Family history of malignant neoplasm of trachea, bronchus and lung: Secondary | ICD-10-CM | POA: Diagnosis not present

## 2017-09-04 LAB — I-STAT ARTERIAL BLOOD GAS, ED
Acid-Base Excess: 9 mmol/L — ABNORMAL HIGH (ref 0.0–2.0)
Bicarbonate: 38.3 mmol/L — ABNORMAL HIGH (ref 20.0–28.0)
O2 SAT: 99 %
PCO2 ART: 73.5 mmHg — AB (ref 32.0–48.0)
PO2 ART: 179 mmHg — AB (ref 83.0–108.0)
Patient temperature: 98.6
TCO2: 41 mmol/L — AB (ref 22–32)
pH, Arterial: 7.325 — ABNORMAL LOW (ref 7.350–7.450)

## 2017-09-04 LAB — BASIC METABOLIC PANEL
Anion gap: 9 (ref 5–15)
BUN: 22 mg/dL (ref 8–23)
CHLORIDE: 99 mmol/L (ref 98–111)
CO2: 32 mmol/L (ref 22–32)
CREATININE: 0.99 mg/dL (ref 0.61–1.24)
Calcium: 8.2 mg/dL — ABNORMAL LOW (ref 8.9–10.3)
GFR calc Af Amer: >60 mL/min (ref >60)
GFR calc non Af Amer: >60 mL/min (ref >60)
Glucose, Bld: 134 mg/dL — ABNORMAL HIGH (ref 70–99)
POTASSIUM: 4.1 mmol/L (ref 3.5–5.1)
Sodium: 140 mmol/L (ref 135–145)

## 2017-09-04 LAB — CBC
HEMATOCRIT: 46.6 % (ref 39.0–52.0)
HEMOGLOBIN: 14.6 g/dL (ref 13.0–17.0)
MCH: 30.5 pg (ref 26.0–34.0)
MCHC: 31.3 g/dL (ref 30.0–36.0)
MCV: 97.5 fL (ref 78.0–100.0)
Platelets: 208 10*3/uL (ref 150–400)
RBC: 4.78 MIL/uL (ref 4.22–5.81)
RDW: 12.6 % (ref 11.5–15.5)
WBC: 14.1 10*3/uL — ABNORMAL HIGH (ref 4.0–10.5)

## 2017-09-04 LAB — TROPONIN I

## 2017-09-04 MED ORDER — ONDANSETRON HCL 4 MG/2ML IJ SOLN: 4.0000 mg | Freq: Four times a day (QID) | INTRAMUSCULAR | Status: DC | PRN

## 2017-09-04 MED ORDER — SODIUM CHLORIDE 0.9 % IV SOLN
500.0000 mg | Freq: Every day | INTRAVENOUS | Status: DC
  Administered 2017-09-05 (×2): 500 mg via INTRAVENOUS
  Filled 2017-09-04 (×3): qty 500

## 2017-09-04 MED ORDER — SODIUM CHLORIDE 0.9% FLUSH
3.0000 mL | INTRAVENOUS | Status: DC | PRN
Start: 2017-09-04 — End: 2017-09-06

## 2017-09-04 MED ORDER — MOMETASONE FURO-FORMOTEROL FUM 100-5 MCG/ACT IN AERO
2.0000 | INHALATION_SPRAY | Freq: Two times a day (BID) | RESPIRATORY_TRACT | Status: DC
  Administered 2017-09-05: 2 via RESPIRATORY_TRACT
  Filled 2017-09-04: qty 8.8

## 2017-09-04 MED ORDER — ENOXAPARIN SODIUM 40 MG/0.4ML ~~LOC~~ SOLN
40.0000 mg | Freq: Every day | SUBCUTANEOUS | Status: DC
  Administered 2017-09-05 – 2017-09-06 (×2): 40 mg via SUBCUTANEOUS
  Filled 2017-09-04 (×4): qty 0.4

## 2017-09-04 MED ORDER — ESCITALOPRAM OXALATE 10 MG PO TABS
10.0000 mg | ORAL_TABLET | Freq: Every day | ORAL | Status: DC
  Administered 2017-09-05 – 2017-09-07 (×3): 10 mg via ORAL
  Filled 2017-09-04 (×3): qty 1

## 2017-09-04 MED ORDER — SODIUM CHLORIDE 0.9 % IV SOLN
250.0000 mL | INTRAVENOUS | Status: DC | PRN
  Administered 2017-09-05: 250 mL via INTRAVENOUS

## 2017-09-04 MED ORDER — SODIUM CHLORIDE 0.9% FLUSH
3.0000 mL | Freq: Two times a day (BID) | INTRAVENOUS | Status: DC
  Administered 2017-09-05 – 2017-09-07 (×4): 3 mL via INTRAVENOUS

## 2017-09-04 MED ORDER — SENNOSIDES-DOCUSATE SODIUM 8.6-50 MG PO TABS: 1.0000 | ORAL_TABLET | Freq: Every evening | ORAL | Status: DC | PRN

## 2017-09-04 MED ORDER — LORAZEPAM 2 MG/ML IJ SOLN
0.5000 mg | Freq: Once | INTRAMUSCULAR | Status: AC
  Administered 2017-09-04: 0.5 mg via INTRAVENOUS
  Filled 2017-09-04: qty 1

## 2017-09-04 MED ORDER — ARIPIPRAZOLE 5 MG PO TABS
5.0000 mg | ORAL_TABLET | Freq: Every day | ORAL | Status: DC
  Administered 2017-09-05 – 2017-09-07 (×3): 5 mg via ORAL
  Filled 2017-09-04 (×3): qty 1

## 2017-09-04 MED ORDER — ALBUTEROL SULFATE (2.5 MG/3ML) 0.083% IN NEBU: 5.0000 mg | INHALATION_SOLUTION | RESPIRATORY_TRACT | Status: DC | PRN

## 2017-09-04 MED ORDER — MAGNESIUM SULFATE 2 GM/50ML IV SOLN
2.0000 g | Freq: Once | INTRAVENOUS | Status: AC
  Administered 2017-09-04: 2 g via INTRAVENOUS
  Filled 2017-09-04: qty 50

## 2017-09-04 MED ORDER — ALBUTEROL (5 MG/ML) CONTINUOUS INHALATION SOLN
INHALATION_SOLUTION | RESPIRATORY_TRACT | Status: AC
  Filled 2017-09-04: qty 20

## 2017-09-04 MED ORDER — PRAVASTATIN SODIUM 20 MG PO TABS
20.0000 mg | ORAL_TABLET | Freq: Every day | ORAL | Status: DC
  Administered 2017-09-05 – 2017-09-07 (×3): 20 mg via ORAL
  Filled 2017-09-04 (×3): qty 1

## 2017-09-04 MED ORDER — TIOTROPIUM BROMIDE MONOHYDRATE 18 MCG IN CAPS
18.0000 ug | ORAL_CAPSULE | Freq: Every day | RESPIRATORY_TRACT | Status: DC
  Administered 2017-09-05: 18 ug via RESPIRATORY_TRACT
  Filled 2017-09-04: qty 5

## 2017-09-04 MED ORDER — SODIUM CHLORIDE 0.9% FLUSH: 3.0000 mL | Freq: Two times a day (BID) | INTRAVENOUS | Status: DC

## 2017-09-04 MED ORDER — ACETAMINOPHEN 325 MG PO TABS: 650.0000 mg | ORAL_TABLET | Freq: Four times a day (QID) | ORAL | Status: DC | PRN

## 2017-09-04 MED ORDER — METHYLPREDNISOLONE SODIUM SUCC 125 MG IJ SOLR
60.0000 mg | Freq: Four times a day (QID) | INTRAMUSCULAR | Status: DC
  Administered 2017-09-05 (×4): 60 mg via INTRAVENOUS
  Filled 2017-09-04 (×4): qty 2

## 2017-09-04 MED ORDER — ONDANSETRON HCL 4 MG PO TABS: 4.0000 mg | ORAL_TABLET | Freq: Four times a day (QID) | ORAL | Status: DC | PRN

## 2017-09-04 MED ORDER — ACETAMINOPHEN 650 MG RE SUPP: 650.0000 mg | Freq: Four times a day (QID) | RECTAL | Status: DC | PRN

## 2017-09-04 NOTE — ED Provider Notes (Signed)
[facility] EMERGENCY DEPARTMENT Provider Note   CSN: [id number] Arrival date & time: 09/04/17  1957     History   Chief Complaint Chief Complaint  Patient presents with  . Shortness of Breath  . Chest Pain    HPI Taylor [name] is a 62 y.o. male.  62 year old male with history of COPD presents with increasing shortness of breath and chest tightness.  Patient says he has had nonproductive cough without fever or chills.  Ran out of his home medications last night.  EMS called and given 2 duo nebs as well as Solu-Medrol.  Patient presented recently with similar symptoms and was placed on BiPAP.  Denies any pleuritic chest pain or lower extremity edema.  No recent history of blood loss.  States he feels very anxious at this time.     Past Medical History:  Diagnosis Date  . Asthma   . Bipolar 1 disorder ([place]) 02/05/2012  . Colon cancer ([place]) 04/11/11   adenocarcinoma of colon, 7/19 nodes pos.FINISHED CHEMO/DR. SHERRILL  . COPD (chronic obstructive pulmonary disease) ([place])    SMOKER  . Depression   . Emphysema of lung ([place])   . Full dentures   . Hemorrhoids   . On home oxygen therapy    "3L; 24/7" (05/29/2017)  . Pneumonia ~ 2016   "double pneumonia"  . Prostate cancer ([place])    Gleason score = 7, supposed to have radiation therapy but he has not followed up (05/29/2017)  . Rib fractures    hx of    Patient Active Problem List   Diagnosis Date Noted  . Acute respiratory failure with hypercapnia ([place])   . SOB (shortness of breath)   . Malnutrition of moderate degree 06/14/2017  . Hyperglycemia 06/08/2017  . Constipation 06/08/2017  . SIRS (systemic inflammatory response syndrome) ([place]) 05/21/2017  . Tobacco abuse 05/13/2017  . HLD (hyperlipidemia) 05/13/2017  . Leukocytosis 04/06/2017  . Adjustment disorder with depressed mood 03/28/2017  . Adjustment disorder with mixed disturbance of emotions and conduct   . Chronic respiratory failure with  hypoxia ([place]) 03/24/2017  . Normocytic normochromic anemia 03/24/2017  . COPD with acute exacerbation ([place]) 10/22/2016  . Alcohol abuse 01/09/2013  . COPD exacerbation ([place]) 01/09/2013  . Chest pain 01/09/2013  . Smoker 12/16/2012  . Inguinal hernia unilateral, non-recurrent, right 05/01/2012  . Dyspepsia 02/05/2012  . Bipolar 1 disorder ([place]) 02/05/2012  . 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 COPD  GOLD III 02/03/2012  . Thrombocytopenia ([place]) 02/03/2012  . Depression   . 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 COLON SURGERY  04/11/11   Sigmoid colectomy  . COLOSTOMY REVISION  04/11/2011   Procedure: COLON RESECTION SIGMOID;  Surgeon: [name], MD;  Location: [facility];  Service: General;  Laterality: N/A;  low anterior colon resection   . HERNIA REPAIR    . INGUINAL HERNIA REPAIR Right 05/01/2012   Procedure: HERNIA REPAIR INGUINAL ADULT;  Surgeon: [name], MD;  Location: [facility];  Service: General;  Laterality: Right;  . INSERTION OF MESH Right 05/01/2012   Procedure: INSERTION OF MESH;  Surgeon: [name], MD;  Location: [facility];  Service: General;  Laterality: Right;  . PORT-A-CATH REMOVAL Left 05/01/2012   Procedure: REMOVAL Infusion Port;  Surgeon: [name], MD;  Location: [facility];  Service: General;  Laterality: Left;  . PORTACATH PLACEMENT  05/02/2011   Procedure: INSERTION  PORT-A-CATH;  Surgeon: [name], MD;  Location: [facility];  Service: General;  Laterality: N/A;  . PROSTATE BIOPSY          Home Medications    Prior to Admission medications   Medication Sig Start Date End Date Taking? Authorizing Provider  albuterol (PROVENTIL HFA;VENTOLIN HFA) 108 (90 Base) MCG/ACT inhaler Inhale 2 puffs into the lungs every 6 (six) hours as needed for wheezing or shortness of breath. 08/26/17   Isabelle Course, MD  albuterol (PROVENTIL) (5 MG/ML) 0.5% nebulizer solution Take 1 mL (5 mg total) by  nebulization every 6 (six) hours as needed for wheezing or shortness of breath. 08/26/17   Isabelle Course, MD  ARIPiprazole (ABILIFY) 5 MG tablet Take 1 tablet (5 mg total) by mouth daily with breakfast. 08/27/17   Isabelle Course, MD  escitalopram (LEXAPRO) 10 MG tablet Take 1 tablet (10 mg total) by mouth daily. 08/27/17   Isabelle Course, MD  lovastatin (MEVACOR) 20 MG tablet Take 1 tablet (20 mg total) by mouth at bedtime. 05/18/17   Nita Sells, MD  mometasone-formoterol (DULERA) 100-5 MCG/ACT AERO Inhale 2 puffs into the lungs 2 (two) times daily. 05/18/17   Nita Sells, MD  OXYGEN Inhale 3 L into the lungs.     [provider]  predniSONE (DELTASONE) 20 MG tablet 3 tabs po daily x 3 days, then 2 tabs x 3 days, then 1.5 tabs x 3 days, then 1 tab x 3 days, then 0.5 tabs x 3 days 08/29/17   Mesner, Corene Cornea, MD  tiotropium (SPIRIVA HANDIHALER) 18 MCG inhalation capsule Place 1 capsule (18 mcg total) into inhaler and inhale daily. 08/29/17   Mesner, Corene Cornea, MD    Family History Family History  Problem Relation Age of Onset  . Heart disease Father   . Lung cancer Maternal Uncle        smoked    Social History Social History   Tobacco Use  . Smoking status: Former Smoker    Packs/day: 0.10    Years: 46.00    Pack years: 4.60    Types: Cigarettes    Last attempt to quit: 05/21/2017    Years since quitting: 0.2  . Smokeless tobacco: Former Systems developer    Types: Chew  Substance Use Topics  . Alcohol use: Not Currently    Comment: h/o use in the past, no h/o heavy use  . Drug use: No     Allergies   Codeine   Review of Systems Review of Systems  All other systems reviewed and are negative.    Physical Exam Updated Vital Signs Ht 1.803 m (5\' 11" )   Wt 74.8 kg   BMI 23.01 kg/m   Physical Exam  Constitutional: He is oriented to person, place, and time. He appears well-developed and well-nourished.  Non-toxic appearance. No distress.  HENT:  Head: Normocephalic and  atraumatic.  Eyes: Pupils are equal, round, and reactive to light. Conjunctivae, EOM and lids are normal.  Neck: Normal range of motion. Neck supple. No tracheal deviation present. No thyroid mass present.  Cardiovascular: Normal rate, regular rhythm and normal heart sounds. Exam reveals no gallop.  No murmur heard. Pulmonary/Chest: No stridor. Tachypnea noted. He is in respiratory distress. He has decreased breath sounds in the right lower field and the left lower field. He has wheezes in the right lower field and the left lower field. He has no rales.  Abdominal: Soft. Normal appearance and bowel sounds are normal.  He exhibits no distension. There is no tenderness. There is no rebound and no CVA tenderness.  Musculoskeletal: Normal range of motion. He exhibits no edema or tenderness.  Neurological: He is alert and oriented to person, place, and time. He has normal strength. No cranial nerve deficit or sensory deficit. GCS eye subscore is 4. GCS verbal subscore is 5. GCS motor subscore is 6.  Skin: Skin is warm and dry. No abrasion and no rash noted.  Psychiatric: He has a normal mood and affect. His speech is normal and behavior is normal.  Nursing note and vitals reviewed.    ED Treatments / Results  Labs (all labs ordered are listed, but only abnormal results are displayed) Labs Reviewed  CBC  BASIC METABOLIC PANEL  TROPONIN I  I-STAT ARTERIAL BLOOD GAS, ED    EKG EKG Interpretation  Date/Time:  Thursday September 04 2017 20:13:28 EDT Ventricular Rate:  141 PR Interval:    QRS Duration: 118 QT Interval:  301 QTC Calculation: 461 R Axis:   -90 Text Interpretation:  Sinus tachycardia Multiform ventricular premature complexes Nonspecific IVCD with LAD Anterolateral infarct, old Confirmed by Lacretia Leigh ([id number]) on 09/04/2017 8:17:22 PM   Radiology No results found.  Procedures Procedures (including critical care time)  Medications Ordered in ED Medications  magnesium  sulfate IVPB 2 g 50 mL (has no administration in time range)  LORazepam (ATIVAN) injection 0.5 mg (has no administration in time range)     Initial Impression / Assessment and Plan / ED Course  I have reviewed the triage vital signs and the nursing notes.  Pertinent labs & imaging results that were available during my care of the patient were reviewed by me and considered in my medical decision making (see chart for details).     Patient here severely short of breath.  Also very anxious as well 2.  Given Ativan as well as albuterol.  ABG shows evidence of some hypercapnia with his PCO2 being 73.5.  Also given magnesium as well.  Chest x-ray without acute infiltrate.  Started on BiPAP and is resting more comfortable at this time.  Will repeat patient's ABG if he does not improve.  Will require admission to stepdown   CRITICAL CARE Performed by: Leota Jacobsen Total critical care time: 60 minutes Critical care time was exclusive of separately billable procedures and treating other patients. Critical care was necessary to treat or prevent imminent or life-threatening deterioration. Critical care was time spent personally by me on the following activities: development of treatment plan with patient and/or surrogate as well as nursing, discussions with consultants, evaluation of patient's response to treatment, examination of patient, obtaining history from patient or surrogate, ordering and performing treatments and interventions, ordering and review of laboratory studies, ordering and review of radiographic studies, pulse oximetry and re-evaluation of patient's condition.   Final Clinical Impressions(s) / ED Diagnoses   Final diagnoses:  None    ED Discharge Orders    None       Lacretia Leigh, MD 09/04/17 2158

## 2017-09-04 NOTE — H&P (Signed)
History and Physical    DONTELL MIAN WLN:[id number] DOB: 01-07-1956 DOA: 09/04/2017  PCP: [facility]   Patient coming from: Home   Chief Complaint: SOB, wheezing, cough   HPI: Taylor [name] is a 62 y.o. male with medical history significant for bipolar disorder, COPD with chronic 3 L/min supplemental oxygen requirement, and prostate cancer referred to urology earlier this week, now presenting to the emergency department for evaluation of shortness of breath and wheezing.  Patient was admitted to the hospital several days ago with acute exacerbation in COPD, and improved, was discharged on 09/01/2017, was unable to fill his prednisone taper, and has progressively re-worsened.  He denies fevers, chills, chest pain, or pain or swelling in the lower extremities.  He reports marked shortness of breath at rest today, called EMS, was treated with multiple nebs and 125 mg of IV Solu-Medrol, and transported to the hospital.  ED Course: Upon arrival to the ED, patient is found to be afebrile, saturating low 90s on BiPAP, tachypneic in the 40s, tachycardic, and with stable blood pressure.  EKG features sinus tachycardia with PVCs.  Chest x-ray is consistent with stable emphysema and no acute finding.  Chemistry panel is unremarkable and CBC is notable for a leukocytosis to 14,100.  Troponin is undetectable.  ABG features a respiratory acidosis with PCO2 of 73.5.  He was treated with BiPAP, Ativan, and IV magnesium in the ED.  He remains dyspneic and tachypneic at rest, continues to require BiPAP, and will be admitted for ongoing evaluation and management.  Review of Systems:  All other systems reviewed and apart from HPI, are negative.  Past Medical History:  Diagnosis Date  . Asthma   . Bipolar 1 disorder ([place]) 02/05/2012  . Colon cancer ([place]) 04/11/11   adenocarcinoma of colon, 7/19 nodes pos.FINISHED CHEMO/DR. SHERRILL  . COPD (chronic obstructive pulmonary disease)  ([place])    SMOKER  . Depression   . Emphysema of lung ([place])   . Full dentures   . Hemorrhoids   . On home oxygen therapy    "3L; 24/7" (05/29/2017)  . Pneumonia ~ 2016   "double pneumonia"  . Prostate cancer ([place])    Gleason score = 7, supposed to have radiation therapy but he has not followed up (05/29/2017)  . Rib fractures    hx of    Past Surgical History:  Procedure Laterality Date  . COLON SURGERY  04/11/11   Sigmoid colectomy  . COLOSTOMY REVISION  04/11/2011   Procedure: COLON RESECTION SIGMOID;  Surgeon: [name], MD;  [facility]: [facility];  Service: General;  Laterality: N/A;  low anterior colon resection   . HERNIA REPAIR    . INGUINAL HERNIA REPAIR Right 05/01/2012   Procedure: HERNIA REPAIR INGUINAL ADULT;  Surgeon: [name], MD;  Location: [facility];  Service: General;  Laterality: Right;  . INSERTION OF MESH Right 05/01/2012   Procedure: INSERTION OF MESH;  Surgeon: [name], MD;  [facility]: [facility];  Service: General;  Laterality: Right;  . PORT-A-CATH REMOVAL Left 05/01/2012   Procedure: REMOVAL Infusion Port;  Surgeon: [name], MD;  Location: [facility];  Service: General;  Laterality: Left;  . PORTACATH PLACEMENT  05/02/2011   Procedure: INSERTION PORT-A-CATH;  Surgeon: [name], MD;  [facility]: [facility];  Service: General;  Laterality: N/A;  . PROSTATE BIOPSY       reports that he quit smoking about 3 months ago. His smoking use  included cigarettes. He has a 4.60 pack-year smoking history. He has quit using smokeless tobacco.  His smokeless tobacco use included chew. He reports that he drank alcohol. He reports that he does not use drugs.  Allergies  Allergen Reactions  . Codeine Nausea And Vomiting    Family History  Problem Relation Age of Onset  . Heart disease Father   . Lung cancer Maternal Uncle        smoked     Prior to Admission medications   Medication Sig Start Date End Date Taking? Authorizing Provider  albuterol (PROVENTIL HFA;VENTOLIN  HFA) 108 (90 Base) MCG/ACT inhaler Inhale 2 puffs into the lungs every 6 (six) hours as needed for wheezing or shortness of breath. 08/26/17  Yes Isabelle Course, MD  albuterol (PROVENTIL) (5 MG/ML) 0.5% nebulizer solution Take 1 mL (5 mg total) by nebulization every 6 (six) hours as needed for wheezing or shortness of breath. 08/26/17  Yes Isabelle Course, MD  ARIPiprazole (ABILIFY) 5 MG tablet Take 1 tablet (5 mg total) by mouth daily with breakfast. 08/27/17  Yes Isabelle Course, MD  escitalopram (LEXAPRO) 10 MG tablet Take 1 tablet (10 mg total) by mouth daily. 08/27/17  Yes Isabelle Course, MD  lovastatin (MEVACOR) 20 MG tablet Take 1 tablet (20 mg total) by mouth at bedtime. 05/18/17  Yes Nita Sells, MD  mometasone-formoterol (DULERA) 100-5 MCG/ACT AERO Inhale 2 puffs into the lungs 2 (two) times daily. 05/18/17  Yes Nita Sells, MD  OXYGEN Inhale 3 L into the lungs.    Yes [provider]  tiotropium (SPIRIVA HANDIHALER) 18 MCG inhalation capsule Place 1 capsule (18 mcg total) into inhaler and inhale daily. 08/29/17  Yes Mesner, Corene Cornea, MD    Physical Exam: Vitals:   09/04/17 2230 09/04/17 2245 09/04/17 2300 09/04/17 2315  BP: 105/73 107/71 126/76 117/80  Pulse: 84 86 87 85  Resp: (!) 24 (!) 24 (!) 28 15  Temp:      TempSrc:      SpO2: 97% 97% 99% 98%  Weight:      Height:          Constitutional: Tachypnea, dyspnea, increased WOB, no pallor or diaphoresis  Eyes: PERTLA, lids and conjunctivae normal ENMT: Mucous membranes are moist. Posterior pharynx clear of any exudate or lesions.   Neck: normal, supple, no masses, no thyromegaly Respiratory: Tachypnea, markedly diminished breath sounds, prolonged expiratory phase. No pallor or cyanosis.  Cardiovascular: Rate ~110 and regular. No extremity edema.   Abdomen: No distension, no tenderness, soft. Bowel sounds normal.  Musculoskeletal: no clubbing / cyanosis. No joint deformity upper and lower extremities.    Skin: no  significant rashes, lesions, ulcers. Warm, dry, well-perfused. Neurologic: CN 2-12 grossly intact. Sensation intact. Strength 5/5 in all 4 limbs.  Psychiatric: Alert and oriented to person, place, and situation. Calm, cooperative.     Labs on Admission: I have personally reviewed following labs and imaging studies  CBC: Recent Labs  Lab 08/30/17 2153 08/31/17 0108 09/04/17 2028  WBC 13.6* 12.0* 14.1*  HGB 14.0 13.0 14.6  HCT 44.8 41.7 46.6  MCV 97.2 97.9 97.5  PLT 219 193 161   Basic Metabolic Panel: Recent Labs  Lab 08/30/17 2153 08/31/17 0108 09/04/17 2028  NA 139 138 140  K 3.8 4.2 4.1  CL 96* 95* 99  CO2 37* 32 32  GLUCOSE 142* 220* 134*  BUN 29* 31* 22  CREATININE 0.90 0.92 0.99  CALCIUM 8.5* 8.5* 8.2*  GFR: Estimated Creatinine Clearance: 81.9 mL/min (by C-G formula based on SCr of 0.99 mg/dL). Liver Function Tests: No results for input(s): AST, ALT, ALKPHOS, BILITOT, PROT, ALBUMIN in the last 168 hours. No results for input(s): LIPASE, AMYLASE in the last 168 hours. No results for input(s): AMMONIA in the last 168 hours. Coagulation Profile: No results for input(s): INR, PROTIME in the last 168 hours. Cardiac Enzymes: Recent Labs  Lab 09/04/17 2028  TROPONINI <0.03   BNP (last 3 results) No results for input(s): PROBNP in the last 8760 hours. HbA1C: No results for input(s): HGBA1C in the last 72 hours. CBG: Recent Labs  Lab 08/31/17 0754 09/01/17 0749  GLUCAP 214* 121*   Lipid Profile: No results for input(s): CHOL, HDL, LDLCALC, TRIG, CHOLHDL, LDLDIRECT in the last 72 hours. Thyroid Function Tests: No results for input(s): TSH, T4TOTAL, FREET4, T3FREE, THYROIDAB in the last 72 hours. Anemia Panel: No results for input(s): VITAMINB12, FOLATE, FERRITIN, TIBC, IRON, RETICCTPCT in the last 72 hours. Urine analysis:    Component Value Date/Time   COLORURINE YELLOW 06/13/2017 0203   APPEARANCEUR CLEAR 06/13/2017 0203   LABSPEC 1.018 06/13/2017  0203   PHURINE 6.0 06/13/2017 0203   GLUCOSEU 50 (A) 06/13/2017 0203   HGBUR NEGATIVE 06/13/2017 0203   BILIRUBINUR NEGATIVE 06/13/2017 0203   KETONESUR NEGATIVE 06/13/2017 0203   PROTEINUR NEGATIVE 06/13/2017 0203   UROBILINOGEN 1.0 01/09/2013 1843   NITRITE NEGATIVE 06/13/2017 0203   LEUKOCYTESUR NEGATIVE 06/13/2017 0203   Sepsis Labs: @LABRCNTIP (procalcitonin:4,lacticidven:4) )No results found for this or any previous visit (from the past 240 hour(s)).   Radiological Exams on Admission: Dg Chest Portable 1 View  Result Date: 09/04/2017 CLINICAL DATA:  62 y/o  M; shortness of breath and chest pain. EXAM: PORTABLE CHEST 1 VIEW COMPARISON:  08/30/2017 chest radiograph. FINDINGS: Normal cardiac silhouette. Stable emphysema and hyperinflation of the lungs. No consolidation. Chronic blunting of left costodiaphragmatic angle. No pleural effusion or pneumothorax. No acute osseous abnormality is evident. IMPRESSION: No active disease.  Stable emphysema. Electronically Signed   By: Kristine Garbe M.D.   On: 09/04/2017 20:28    EKG: Independently reviewed. Sinus tachycardia (rate 141), PVC's.   Assessment/Plan  1. COPD with acute exacerbation; acute on chronic hypoxic and hypercarbic respiratory failure  - Improved during recent admission, was unable to fill his prednisone after discharge on 8/12, and has progressive worsening in SOB, cough, and wheezing since  - Nebs and 125 mg IV Solu-Medrol given by EMS prior to arrival  - He is in respiratory distress on arrival, started on BiPAP  - He is afebrile with no focal consolidation on CXR, denies chest pain or leg swelling or tenderness  - Check sputum culture, continue systemic steroid, start azithromycin, continue Dulera, Spiriva, and albuterol nebs    2. Bipolar disorder  - Continue Abilify and Lexapro    3. Prostate cancer  - Just referred to urology a couple days ago and encouraged to follow-through with this  - No metastatic  disease appreciated on recent CT     DVT prophylaxis: Lovenox Code Status: Full  Family Communication: Discussed with patient  Consults called: none Admission status: Inpatient     Vianne Bulls, MD [facility] Pager [phone]  If 7PM-7AM, please contact night-coverage [email or web address] Password [facility]  09/04/2017, 11:25 PM

## 2017-09-04 NOTE — ED Triage Notes (Signed)
Pt presents with SOB, CP ongoing since last night; pt states he ran out of meds at home; EMS gave 2 Duonebs and 125mg  solumedrol ;pt states he was put on Bipap last time he was here; pt also says his chest is burning and cant catch his breath, pt resp are labored and patient appears anxious

## 2017-09-05 LAB — BASIC METABOLIC PANEL
ANION GAP: 6 (ref 5–15)
BUN: 24 mg/dL — ABNORMAL HIGH (ref 8–23)
CALCIUM: 8.2 mg/dL — AB (ref 8.9–10.3)
CO2: 35 mmol/L — ABNORMAL HIGH (ref 22–32)
Chloride: 97 mmol/L — ABNORMAL LOW (ref 98–111)
Creatinine, Ser: 0.97 mg/dL (ref 0.61–1.24)
GFR calc Af Amer: >60 mL/min (ref >60)
GLUCOSE: 263 mg/dL — AB (ref 70–99)
Potassium: 5.4 mmol/L — ABNORMAL HIGH (ref 3.5–5.1)
SODIUM: 138 mmol/L (ref 135–145)

## 2017-09-05 LAB — MRSA PCR SCREENING: MRSA BY PCR: NEGATIVE

## 2017-09-05 MED ORDER — METHYLPREDNISOLONE SODIUM SUCC 125 MG IJ SOLR
60.0000 mg | Freq: Three times a day (TID) | INTRAMUSCULAR | Status: DC
  Administered 2017-09-05 – 2017-09-06 (×3): 60 mg via INTRAVENOUS
  Filled 2017-09-05 (×3): qty 2

## 2017-09-05 MED ORDER — BUDESONIDE 0.25 MG/2ML IN SUSP
0.2500 mg | Freq: Two times a day (BID) | RESPIRATORY_TRACT | Status: DC
Start: 2017-09-05 — End: 2017-09-07
  Administered 2017-09-05 – 2017-09-07 (×4): 0.25 mg via RESPIRATORY_TRACT
  Filled 2017-09-05 (×4): qty 2

## 2017-09-05 MED ORDER — IPRATROPIUM-ALBUTEROL 0.5-2.5 (3) MG/3ML IN SOLN
3.0000 mL | Freq: Four times a day (QID) | RESPIRATORY_TRACT | Status: DC
  Administered 2017-09-05 – 2017-09-06 (×5): 3 mL via RESPIRATORY_TRACT
  Filled 2017-09-05 (×6): qty 3

## 2017-09-05 MED ORDER — ACETAMINOPHEN 325 MG PO TABS
650.0000 mg | ORAL_TABLET | Freq: Four times a day (QID) | ORAL | Status: DC | PRN
  Administered 2017-09-05 – 2017-09-06 (×2): 650 mg via ORAL
  Filled 2017-09-05 (×2): qty 2

## 2017-09-05 MED ORDER — ALBUTEROL SULFATE (2.5 MG/3ML) 0.083% IN NEBU: 5.0000 mg | INHALATION_SOLUTION | RESPIRATORY_TRACT | Status: DC | PRN

## 2017-09-05 NOTE — Progress Notes (Signed)
[place] TEAM 1 - Stepdown/ICU TEAM  DONTREAL MIERA  GBT:[id number] DOB: October 06, 1955 DOA: 09/04/2017 PCP: [facility]    Brief Narrative:  62 y.o. male with a hx of bipolar disorder, COPD with chronic 3 L/min supplemental oxygen requirement, and prostate cancer referred to Urology earlier this week who presented to the ED w/ shortness of breath and wheezing.  Patient was admitted to the hospital several days prior with an acute exacerbation in COPD, and was discharged on 09/01/2017.  He was unable to fill his prednisone taper, and progressively re-worsened.    Significant Events: 8/15 admit   Subjective: Reports that he is not yet feeling significantly better.  Denies cp, n/v, or abdom pain, but still feels quite SOB>    Assessment & Plan:  Acute COPD exacerbation - acute on chronic hypoxic respiratory failure Cont maximal medical care - pt continues to wheeze but is not in acute distress at the time of my exam   DM A1c 05/09/17 c/w DM - check again now - utilize SSI while on steroid - may be able to control w/ diet alone when not on steroids  Bipolar disorder  Prostate CA  DVT prophylaxis: lovenox  Code Status: FULL CODE Family Communication: no family present at time of exam  Disposition Plan:   Consultants:  none  Antimicrobials:  Azithromycin 8/15 >   Objective: Blood pressure (!) 150/87, pulse 96, temperature 98.5 F (36.9 C), temperature source Oral, resp. rate 18, height 5\' 11"  (1.803 m), weight 74.8 kg, SpO2 96 %.  Intake/Output Summary (Last 24 hours) at 09/05/2017 1343 Last data filed at 09/05/2017 0434 Gross per 24 hour  Intake 300 ml  Output 400 ml  Net -100 ml   Filed Weights   09/04/17 2010  Weight: 74.8 kg    Examination: General: No acute respiratory distress Lungs: diffuse exp wheezing - no focal crackles  Cardiovascular: Regular rate and rhythm without murmur gallop or rub normal S1 and S2 Abdomen: Nontender,  nondistended, soft, bowel sounds positive, no rebound, no ascites, no appreciable mass Extremities: No significant cyanosis, clubbing, or edema bilateral lower extremities  CBC: Recent Labs  Lab 08/30/17 2153 08/31/17 0108 09/04/17 2028  WBC 13.6* 12.0* 14.1*  HGB 14.0 13.0 14.6  HCT 44.8 41.7 46.6  MCV 97.2 97.9 97.5  PLT 219 193 710   Basic Metabolic Panel: Recent Labs  Lab 08/31/17 0108 09/04/17 2028 09/05/17 0358  NA 138 140 138  K 4.2 4.1 5.4*  CL 95* 99 97*  CO2 32 32 35*  GLUCOSE 220* 134* 263*  BUN 31* 22 24*  CREATININE 0.92 0.99 0.97  CALCIUM 8.5* 8.2* 8.2*   GFR: Estimated Creatinine Clearance: 83.5 mL/min (by C-G formula based on SCr of 0.97 mg/dL).  Liver Function Tests: No results for input(s): AST, ALT, ALKPHOS, BILITOT, PROT, ALBUMIN in the last 168 hours. No results for input(s): LIPASE, AMYLASE in the last 168 hours. No results for input(s): AMMONIA in the last 168 hours.   Cardiac Enzymes: Recent Labs  Lab 09/04/17 2028  TROPONINI <0.03    HbA1C: Hgb A1c MFr Bld  Date/Time Value Ref Range Status  04/29/2017 02:42 AM 6.5 (H) 4.8 - 5.6 % Final    Comment:    (NOTE) Pre diabetes:          5.7%-6.4% Diabetes:              >6.4% Glycemic control for   <7.0% adults with diabetes  02/05/2012 04:45 AM 6.2 (H) <5.7 % Final    Comment:    (NOTE)                                                                       According to the ADA Clinical Practice Recommendations for 2011, when HbA1c is used as a screening test:  >=6.5%   Diagnostic of Diabetes Mellitus           (if abnormal result is confirmed) 5.7-6.4%   Increased risk of developing Diabetes Mellitus References:Diagnosis and Classification of Diabetes Mellitus,Diabetes HRCB,6384,53(MIWOE 1):S62-S69 and Standards of Medical Care in         Diabetes - 2011,Diabetes HOZY,2482,50 (Suppl 1):S11-S61.    CBG: Recent Labs  Lab 08/31/17 0754 09/01/17 0749  GLUCAP 214* 121*     Recent Results (from the past 240 hour(s))  MRSA PCR Screening     Status: None   Collection Time: 09/05/17  6:50 AM  Result Value Ref Range Status   MRSA by PCR NEGATIVE NEGATIVE Final    Comment:        The GeneXpert MRSA Assay (FDA approved for NASAL specimens only), is one component of a comprehensive MRSA colonization surveillance program. It is not intended to diagnose MRSA infection nor to guide or monitor treatment for MRSA infections. Performed at [facility], [place] [address], [place], [place] [address]      Scheduled Meds: . ARIPiprazole  5 mg Oral Q breakfast  . enoxaparin (LOVENOX) injection  40 mg Subcutaneous Daily  . escitalopram  10 mg Oral Daily  . methylPREDNISolone (SOLU-MEDROL) injection  60 mg Intravenous Q6H  . mometasone-formoterol  2 puff Inhalation BID  . pravastatin  20 mg Oral q1800  . sodium chloride flush  3 mL Intravenous Q12H  . sodium chloride flush  3 mL Intravenous Q12H  . tiotropium  18 mcg Inhalation Daily     LOS: 1 day   Cherene Altes, MD [facility] Office  [phone] Pager - Text Page per Shea Evans  If 7PM-7AM, please contact night-coverage per Amion 09/05/2017, 1:43 PM

## 2017-09-05 NOTE — Progress Notes (Signed)
Inpatient Diabetes Program Recommendations  AACE/ADA: New Consensus Statement on Inpatient Glycemic Control (2015)  Target Ranges:  Prepandial:   less than 140 mg/dL      Peak postprandial:   less than 180 mg/dL (1-2 hours)      Critically ill patients:  140 - 180 mg/dL   Lab Results  Component Value Date   GLUCAP 121 (H) 09/01/2017   HGBA1C 6.5 (H) 04/29/2017    Review of Glycemic Control Results for Taylor [name], Taylor [name] (MRN [id number]) as of 09/05/2017 12:24  Ref. Range 09/05/2017 03:58  Glucose Latest Ref Range: 70 - 99 mg/dL 263 (H)   Diabetes history: None noted Outpatient Diabetes medications:  None Current orders for Inpatient glycemic control:  Solumedrol 60 mg IV q 6 hours  Inpatient Diabetes Program Recommendations:   While on Solumedrol may consider checking CBG's tid with meals and HS.  Add correction if blood sugars>150 mg/dL.   Thanks,  Adah Perl, RN, BC-ADM Inpatient Diabetes Coordinator Pager [phone] (8a-5p)

## 2017-09-05 NOTE — Progress Notes (Signed)
Sent FYI page to Dr. Thereasa Solo concerning patient's HR. HR is irregular 95-145 (at rest), but rhythm appears to be ST on the monitor. Will continue to monitor.

## 2017-09-06 LAB — COMPREHENSIVE METABOLIC PANEL
ALT: 23 U/L (ref 0–44)
AST: 13 U/L — ABNORMAL LOW (ref 15–41)
Albumin: 3.1 g/dL — ABNORMAL LOW (ref 3.5–5.0)
Alkaline Phosphatase: 48 U/L (ref 38–126)
Anion gap: 6 (ref 5–15)
BUN: 21 mg/dL (ref 8–23)
CHLORIDE: 96 mmol/L — AB (ref 98–111)
CO2: 35 mmol/L — AB (ref 22–32)
CREATININE: 0.74 mg/dL (ref 0.61–1.24)
Calcium: 8.7 mg/dL — ABNORMAL LOW (ref 8.9–10.3)
Glucose, Bld: 289 mg/dL — ABNORMAL HIGH (ref 70–99)
POTASSIUM: 4.4 mmol/L (ref 3.5–5.1)
SODIUM: 137 mmol/L (ref 135–145)
Total Bilirubin: 0.6 mg/dL (ref 0.3–1.2)
Total Protein: 5.2 g/dL — ABNORMAL LOW (ref 6.5–8.1)

## 2017-09-06 LAB — CBC
HEMATOCRIT: 38.9 % — AB (ref 39.0–52.0)
HEMOGLOBIN: 12.6 g/dL — AB (ref 13.0–17.0)
MCH: 30.1 pg (ref 26.0–34.0)
MCHC: 32.4 g/dL (ref 30.0–36.0)
MCV: 93.1 fL (ref 78.0–100.0)
PLATELETS: 190 10*3/uL (ref 150–400)
RBC: 4.18 MIL/uL — AB (ref 4.22–5.81)
RDW: 12.2 % (ref 11.5–15.5)
WBC: 18 10*3/uL — ABNORMAL HIGH (ref 4.0–10.5)

## 2017-09-06 LAB — GLUCOSE, CAPILLARY
GLUCOSE-CAPILLARY: 367 mg/dL — AB (ref 70–99)
Glucose-Capillary: 258 mg/dL — ABNORMAL HIGH (ref 70–99)

## 2017-09-06 LAB — HEMOGLOBIN A1C
HEMOGLOBIN A1C: 7.2 % — AB (ref 4.8–5.6)
MEAN PLASMA GLUCOSE: 159.94 mg/dL

## 2017-09-06 MED ORDER — INSULIN ASPART 100 UNIT/ML ~~LOC~~ SOLN
0.0000 [IU] | Freq: Every day | SUBCUTANEOUS | Status: DC
  Administered 2017-09-06: 3 [IU] via SUBCUTANEOUS

## 2017-09-06 MED ORDER — AZITHROMYCIN 250 MG PO TABS
500.0000 mg | ORAL_TABLET | ORAL | Status: DC
  Administered 2017-09-06: 500 mg via ORAL
  Filled 2017-09-06: qty 2

## 2017-09-06 MED ORDER — METHYLPREDNISOLONE SODIUM SUCC 40 MG IJ SOLR
40.0000 mg | Freq: Two times a day (BID) | INTRAMUSCULAR | Status: DC
  Administered 2017-09-07 (×2): 40 mg via INTRAVENOUS
  Filled 2017-09-06 (×2): qty 1

## 2017-09-06 MED ORDER — ALUM & MAG HYDROXIDE-SIMETH 200-200-20 MG/5ML PO SUSP
30.0000 mL | ORAL | Status: DC | PRN
  Administered 2017-09-06: 30 mL via ORAL
  Filled 2017-09-06: qty 30

## 2017-09-06 MED ORDER — METFORMIN HCL 500 MG PO TABS
500.0000 mg | ORAL_TABLET | Freq: Two times a day (BID) | ORAL | Status: DC
  Administered 2017-09-06 – 2017-09-07 (×3): 500 mg via ORAL
  Filled 2017-09-06 (×3): qty 1

## 2017-09-06 MED ORDER — INSULIN ASPART 100 UNIT/ML ~~LOC~~ SOLN
0.0000 [IU] | Freq: Three times a day (TID) | SUBCUTANEOUS | Status: DC
  Administered 2017-09-06: 9 [IU] via SUBCUTANEOUS
  Administered 2017-09-07 (×2): 3 [IU] via SUBCUTANEOUS

## 2017-09-06 NOTE — Progress Notes (Signed)
[place] TEAM 1 - Stepdown/ICU TEAM  KREW HORTMAN  KVQ:[id number] DOB: 19-May-1955 DOA: 09/04/2017 PCP: [facility]    Brief Narrative:  62 y.o. male with a hx of bipolar disorder, COPD with chronic 3 L/min supplemental oxygen requirement, and prostate cancer recently referred to Urology who presented to the ED w/ shortness of breath and wheezing.  Patient was admitted to the hospital several days prior with an acute exacerbation of COPD, and was discharged on 09/01/2017.  He was unable to fill his prednisone taper, and progressively re-worsened.    Significant Events: 8/15 admit   Subjective: The patient states he feels better today when at rest but he quickly becomes dyspneic when even just trying to walk to the bathroom.  He denies chest pain nausea vomiting or abdominal pain.  Assessment & Plan:  Acute COPD exacerbation - acute on chronic hypoxic respiratory failure Cont maximal medical care - slowly improving - will need to assure he can ambulate w/o severe dyspnea prior to d/c   DM A1c 05/09/17 c/w DM - A1c this admit confirms true DM at this time at 7.2 - utilize SSI while on steroid - educate on diet - may be able to control w/ oral meds and diet only once off steroid   Bipolar disorder Continue abilify - appears well managed at this time   Prostate CA F/u w/ Urology as outpt   DVT prophylaxis: lovenox  Code Status: FULL CODE Family Communication: no family present at time of exam  Disposition Plan: transfer to tele bed - ambulate   Consultants:  none  Antimicrobials:  Azithromycin 8/15 >   Objective: Blood pressure 110/62, pulse 77, temperature 98.4 F (36.9 C), temperature source Oral, resp. rate 18, height 5\' 11"  (1.803 m), weight 70.8 kg, SpO2 97 %.  Intake/Output Summary (Last 24 hours) at 09/06/2017 1454 Last data filed at 09/06/2017 1400 Gross per 24 hour  Intake 1601 ml  Output 1830 ml  Net -229 ml   Filed Weights   09/04/17  2010 09/06/17 0539  Weight: 74.8 kg 70.8 kg    Examination: General: No acute respiratory distress - alert  Lungs: exp wheeze continues but has improved since yesterday - no focal crackles  Cardiovascular: Regular rate and rhythm without murmur  Abdomen: NT/ND, soft, bs+, no mass  Extremities: No signif C/C/E B LE   CBC: Recent Labs  Lab 08/31/17 0108 09/04/17 2028 09/06/17 0448  WBC 12.0* 14.1* 18.0*  HGB 13.0 14.6 12.6*  HCT 41.7 46.6 38.9*  MCV 97.9 97.5 93.1  PLT 193 208 643   Basic Metabolic Panel: Recent Labs  Lab 09/04/17 2028 09/05/17 0358 09/06/17 0448  NA 140 138 137  K 4.1 5.4* 4.4  CL 99 97* 96*  CO2 32 35* 35*  GLUCOSE 134* 263* 289*  BUN 22 24* 21  CREATININE 0.99 0.97 0.74  CALCIUM 8.2* 8.2* 8.7*   GFR: Estimated Creatinine Clearance: 95.9 mL/min (by C-G formula based on SCr of 0.74 mg/dL).  Liver Function Tests: Recent Labs  Lab 09/06/17 0448  AST 13*  ALT 23  ALKPHOS 48  BILITOT 0.6  PROT 5.2*  ALBUMIN 3.1*    Cardiac Enzymes: Recent Labs  Lab 09/04/17 2028  TROPONINI <0.03    HbA1C: Hgb A1c MFr Bld  Date/Time Value Ref Range Status  09/06/2017 04:48 AM 7.2 (H) 4.8 - 5.6 % Final    Comment:    (NOTE) Pre diabetes:  5.7%-6.4% Diabetes:              >6.4% Glycemic control for   <7.0% adults with diabetes   04/29/2017 02:42 AM 6.5 (H) 4.8 - 5.6 % Final    Comment:    (NOTE) Pre diabetes:          5.7%-6.4% Diabetes:              >6.4% Glycemic control for   <7.0% adults with diabetes     CBG: Recent Labs  Lab 08/31/17 0754 09/01/17 0749  GLUCAP 214* 121*    Recent Results (from the past 240 hour(s))  MRSA PCR Screening     Status: None   Collection Time: 09/05/17  6:50 AM  Result Value Ref Range Status   MRSA by PCR NEGATIVE NEGATIVE Final    Comment:        The GeneXpert MRSA Assay (FDA approved for NASAL specimens only), is one component of a comprehensive MRSA colonization surveillance program.  It is not intended to diagnose MRSA infection nor to guide or monitor treatment for MRSA infections. Performed at [facility], [place] [address], [place], [place] [address]      Scheduled Meds: . ARIPiprazole  5 mg Oral Q breakfast  . azithromycin  500 mg Oral Q24H  . budesonide (PULMICORT) nebulizer solution  0.25 mg Nebulization BID  . enoxaparin (LOVENOX) injection  40 mg Subcutaneous Daily  . escitalopram  10 mg Oral Daily  . ipratropium-albuterol  3 mL Nebulization Q6H  . methylPREDNISolone (SOLU-MEDROL) injection  60 mg Intravenous Q8H  . pravastatin  20 mg Oral q1800  . sodium chloride flush  3 mL Intravenous Q12H  . sodium chloride flush  3 mL Intravenous Q12H     LOS: 2 days   Cherene Altes, MD [facility] Office  [phone] Pager - Text Page per Shea Evans  If 7PM-7AM, please contact night-coverage per Amion 09/06/2017, 2:54 PM

## 2017-09-07 DIAGNOSIS — F319 Bipolar disorder, unspecified: Secondary | ICD-10-CM

## 2017-09-07 DIAGNOSIS — J9622 Acute and chronic respiratory failure with hypercapnia: Secondary | ICD-10-CM

## 2017-09-07 DIAGNOSIS — C61 Malignant neoplasm of prostate: Secondary | ICD-10-CM

## 2017-09-07 DIAGNOSIS — J441 Chronic obstructive pulmonary disease with (acute) exacerbation: Secondary | ICD-10-CM

## 2017-09-07 LAB — BASIC METABOLIC PANEL
Anion gap: 7 (ref 5–15)
BUN: 16 mg/dL (ref 8–23)
CALCIUM: 9 mg/dL (ref 8.9–10.3)
CO2: 36 mmol/L — ABNORMAL HIGH (ref 22–32)
CREATININE: 0.84 mg/dL (ref 0.61–1.24)
Chloride: 96 mmol/L — ABNORMAL LOW (ref 98–111)
GFR calc non Af Amer: >60 mL/min (ref >60)
Glucose, Bld: 202 mg/dL — ABNORMAL HIGH (ref 70–99)
Potassium: 4.6 mmol/L (ref 3.5–5.1)
SODIUM: 139 mmol/L (ref 135–145)

## 2017-09-07 LAB — GLUCOSE, CAPILLARY
GLUCOSE-CAPILLARY: 220 mg/dL — AB (ref 70–99)
GLUCOSE-CAPILLARY: 240 mg/dL — AB (ref 70–99)
Glucose-Capillary: 238 mg/dL — ABNORMAL HIGH (ref 70–99)

## 2017-09-07 LAB — CBC
HCT: 45.1 % (ref 39.0–52.0)
HEMOGLOBIN: 14.3 g/dL (ref 13.0–17.0)
MCH: 29.8 pg (ref 26.0–34.0)
MCHC: 31.7 g/dL (ref 30.0–36.0)
MCV: 94 fL (ref 78.0–100.0)
PLATELETS: 225 10*3/uL (ref 150–400)
RBC: 4.8 MIL/uL (ref 4.22–5.81)
RDW: 12.3 % (ref 11.5–15.5)
WBC: 18.3 10*3/uL — ABNORMAL HIGH (ref 4.0–10.5)

## 2017-09-07 MED ORDER — PREDNISONE 10 MG PO TABS: 10.0000 mg | ORAL_TABLET | Freq: Every day | ORAL | 0 refills | Status: DC

## 2017-09-07 MED ORDER — IPRATROPIUM-ALBUTEROL 0.5-2.5 (3) MG/3ML IN SOLN
3.0000 mL | Freq: Three times a day (TID) | RESPIRATORY_TRACT | Status: DC
  Administered 2017-09-07: 3 mL via RESPIRATORY_TRACT
  Filled 2017-09-07 (×3): qty 3

## 2017-09-07 MED ORDER — ALBUTEROL SULFATE (5 MG/ML) 0.5% IN NEBU: 5.0000 mg | INHALATION_SOLUTION | Freq: Four times a day (QID) | RESPIRATORY_TRACT | 3 refills | Status: DC | PRN

## 2017-09-07 MED ORDER — METFORMIN HCL 500 MG PO TABS: 500.0000 mg | ORAL_TABLET | Freq: Two times a day (BID) | ORAL | 0 refills | Status: DC

## 2017-09-07 MED ORDER — AZITHROMYCIN 250 MG PO TABS
500.0000 mg | ORAL_TABLET | ORAL | Status: DC
  Administered 2017-09-07: 500 mg via ORAL
  Filled 2017-09-07: qty 2

## 2017-09-07 MED ORDER — BLOOD GLUCOSE MONITOR KIT: PACK | 0 refills | Status: DC

## 2017-09-07 MED ORDER — AZITHROMYCIN 250 MG PO TABS: ORAL_TABLET | ORAL | 0 refills | Status: DC

## 2017-09-07 NOTE — Progress Notes (Addendum)
Prescription for blood gluose meter and supplies kit given to pt. Case management aware of new prescription, who stated medicaid should help cover some.  

## 2017-09-07 NOTE — Progress Notes (Addendum)
CSW was consulted by physician to assist with transportation needs.  Pt informed CSW that he will EMS transportation at discharging for oxygen transprt.  CSW consulted with RNCM Caryl Pina and RN Ebony Hail.  CSW gave pt resources for food, bus passes for Dr. Network engineer on Monday and a SCAT application for medical transportation needs.   CSW will contact EMS for dispostion. PTAR's estimated arrival at 6:00pm.   Reed Breech LCSWA [phone]

## 2017-09-07 NOTE — Progress Notes (Addendum)
Discharge instructions reviewed with pt. Pt has no questions at this time. Po Zithromax and IV solumedrol given prior to pt being d/c. SCAT information filled out by pt and reviewed by Education officer, museum. Pt states he has oxygen set up at home and he has nebulizer and inhaler at home. IV D/C.

## 2017-09-07 NOTE — Care Management (Signed)
Pt has home O2 through [facility] DME and has no portable tank.  Pt states he needs EMS to transport due to oxygen needs.  Unable to secure a portable tank from [place], so PTAR arranged by Baxter Flattery, [place].  Pt states once he gets home, someone can help him get prescriptions at pharmacy.  With Medicaid, scripts should only be $3/each.  Pt states he has 2-3 of scripts already filled at home.  He states he can borrow money to get them filled as needed.

## 2017-09-07 NOTE — Progress Notes (Addendum)
Pt ambulated in the hallway on room air. Pt's 02 sat went down to 86% and pt became SOB. Pt was gradually placed on 3L of 02 and ambulated with out becoming SOB. 02 sat in the mid 90's. Pt stated he "felt good" while he was walking. Will cont to monitor pt.

## 2017-09-07 NOTE — Evaluation (Signed)
Physical Therapy Evaluation & Discharge Patient Details Name: Taylor [name] MRN: [id number] DOB: January 17, 1956 Today's Date: 09/07/2017   History of Present Illness  Pt is a 62 y.o. male admitted 09/04/17 with COPD exacerbation. PMH Includes COPD (2-3L home O2), bipolar disorder, DM, colon cancer.    Clinical Impression  Patient evaluated by Physical Therapy with no further acute PT needs identified. Pt currently indep with ambulation. DOE 3/4; SpO2 91-93% on 3L O2 [place]. Discussed energy conservation strategies. All education has been completed and the patient has no further questions. PT is signing off. Thank you for this referral.    Follow Up Recommendations No PT follow up    Equipment Recommendations  None recommended by PT    Recommendations for Other Services       Precautions / Restrictions Precautions Precautions: None Restrictions Weight Bearing Restrictions: No      Mobility  Bed Mobility Overal bed mobility: Independent                Transfers Overall transfer level: Independent                  Ambulation/Gait Ambulation/Gait assistance: Independent   Assistive device: None Gait Pattern/deviations: Step-through pattern;Decreased stride length Gait velocity: Decreased Gait velocity interpretation: 1.31 - 2.62 ft/sec, indicative of limited community ambulator General Gait Details: Independent pushing O2 tank. DOE 3/4. SpO2 91-93% on 3L O2 [place]  Stairs Stairs: (Pt declined stair training)          Wheelchair Mobility    Modified Rankin (Stroke Patients Only)       Balance Overall balance assessment: No apparent balance deficits (not formally assessed)                                           Pertinent Vitals/Pain Pain Assessment: No/denies pain    Home Living Family/patient expects to be discharged to:: Group home([facility] house)                 Additional Comments: ramped entrance, no stairs  inside, tub shower     Prior Function Level of Independence: Independent         Comments: 3L home O2. Limited to short ambulation distance secondary to SOB     Hand Dominance        Extremity/Trunk Assessment   Upper Extremity Assessment Upper Extremity Assessment: Overall WFL for tasks assessed    Lower Extremity Assessment Lower Extremity Assessment: Overall WFL for tasks assessed       Communication   Communication: No difficulties  Cognition Arousal/Alertness: Awake/alert Behavior During Therapy: Flat affect Overall Cognitive Status: Within Functional Limits for tasks assessed                                        General Comments      Exercises     Assessment/Plan    PT Assessment Patent does not need any further PT services  PT Problem List         PT Treatment Interventions      PT Goals (Current goals can be found in the Care Plan section)  Acute Rehab PT Goals PT Goal Formulation: All assessment and education complete, DC therapy    Frequency     Barriers to discharge  Co-evaluation               AM-PAC PT "6 Clicks" Daily Activity  Outcome Measure Difficulty turning over in bed (including adjusting bedclothes, sheets and blankets)?: None Difficulty moving from lying on back to sitting on the side of the bed? : None Difficulty sitting down on and standing up from a chair with arms (e.g., wheelchair, bedside commode, etc,.)?: None Help needed moving to and from a bed to chair (including a wheelchair)?: None Help needed walking in hospital room?: None Help needed climbing 3-5 steps with a railing? : A Little 6 Click Score: 23    End of Session Equipment Utilized During Treatment: Oxygen Activity Tolerance: Patient tolerated treatment well Patient left: in bed;with call bell/phone within reach Nurse Communication: Mobility status PT Visit Diagnosis: Other abnormalities of gait and mobility (R26.89)     Time: 7741-4239 PT Time Calculation (min) (ACUTE ONLY): 12 min   Charges:   PT Evaluation $PT Eval Low Complexity: [place], PT, DPT Acute Rehab Services  Pager: [place] 09/07/2017, 2:29 PM

## 2017-09-07 NOTE — Discharge Summary (Signed)
Physician Discharge Summary  Taylor [name] PRF:[id number] DOB: Nov 26, 1955 DOA: 09/04/2017  PCP: [facility]  Admit date: 09/04/2017 Discharge date: 09/07/2017  Admitted From: Home  Disposition:  Home   Recommendations for Outpatient Follow-up:  1. Follow up with new PCP tomorrow 2. Follow up with Pulmonology when able to schedule   Home Health: None  Equipment/Devices: Continued Home O2  Discharge Condition: Fair  CODE STATUS: FULL Diet recommendation: Regular  Brief/Interim Summary: Taylor [name] is a 62 y.o. M with COPD on chronic 3 L/min oxygen, bipolar, and prostate cancer recently referred to urology who presented to the ED with shortness of breath and wheezing.  This is the patient's 12 admission for COPD exacerbation in the last 6 months.  He was admitted less than a week before, treated for COPD, discharged with prednisone, which he did not fill.  His symptoms progressively worsened and he came to the ER.       Discharge Diagnoses:  COPD with acute exacerbation Treated with IV steroids, azithromycin and bronchodilators.  On day of discharge, felt able to walk without dyspnea more than baseline.  Was prescribed 6 day steroid taper and azithromycin, which he said he would not take, because "I have some prednisone at home."  I recommended he discard this and take the new taper and antibiotics and follow up closely with new PCP tomorrow.  He was noncommittal.  Diabetes  Bipolar disorder No active mania.   On SSRI only.  Prostate cancer From what I can tell from notes, he was diagnosed with prostate cancer some time in 2017 while incarcerated (June 2018 notes state "a year ago in prison").  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 -Follow up with IMTS tomorrow for referral to Urology   Discharge Instructions  Discharge Instructions    Diet - low sodium heart healthy   Complete by:  As directed    Discharge instructions    Complete by:  As directed    From Dr. Loleta Books: You were admitted with trouble breathing from COPD. We treated you with IV steroids and albuterol and antibiotics (azithromycin).  It is important that you make it to your appointment tomorrow at the [facility]. Call [facility] first thing in the morning (the phone number is on the back of your Medicaid card) to get transportation.    You should finish the azithromycin/antibiotics Monday and Tuesday: Take azithromycin 250 mg once daily Monday and Tuesday  You should finish the prednisone taper as follows: Take prednisone 60 mg (6 tabs) for one day (Mon), then Take prednisone 50 mg (5 tabs) for one day (Tues), then Take prednisone 40 mg (4 tabs) on Weds, then Take prednisone 30 mg (3 tabs) on Thurs, then Take prednisone 20 mg (2 tabs) on Fri, then Take prednisone 10 mg (1 tab) on Saturday, then stop   Use albuterol as needed for rescue of symptoms You can use albuterol in an inhaler (Proair or Ventolin) or in the nebulizer. I sent a refill of the albuterol nebulizer to [facility], so next time you are there, they should have a prescription for albuterol solution for your nebulizer. Continue your daily maintenance inhalers See the Internal Medicine doctor tomorrow at 1:15PM   You must stop smoking entirely!   Increase activity slowly   Complete by:  As directed      Allergies as of 09/07/2017      Reactions   Codeine Nausea And Vomiting  Medication List    TAKE these medications   albuterol 108 (90 Base) MCG/ACT inhaler Commonly known as:  PROVENTIL HFA;VENTOLIN HFA Inhale 2 puffs into the lungs every 6 (six) hours as needed for wheezing or shortness of breath.   albuterol (5 MG/ML) 0.5% nebulizer solution Commonly known as:  PROVENTIL Take 1 mL (5 mg total) by nebulization every 6 (six) hours as needed for wheezing or shortness of breath.   ARIPiprazole 5 MG tablet Commonly known as:  ABILIFY Take 1 tablet (5 mg  total) by mouth daily with breakfast.   azithromycin 250 MG tablet Commonly known as:  ZITHROMAX Take 2 tablets (500 mg) on  Day 1,  followed by 1 tablet (250 mg) once daily on Days 2 through 5.   escitalopram 10 MG tablet Commonly known as:  LEXAPRO Take 1 tablet (10 mg total) by mouth daily.   lovastatin 20 MG tablet Commonly known as:  MEVACOR Take 1 tablet (20 mg total) by mouth at bedtime.   metFORMIN 500 MG tablet Commonly known as:  GLUCOPHAGE Take 1 tablet (500 mg total) by mouth 2 (two) times daily with a meal.   mometasone-formoterol 100-5 MCG/ACT Aero Commonly known as:  DULERA Inhale 2 puffs into the lungs 2 (two) times daily.   OXYGEN Inhale 3 L into the lungs.   predniSONE 10 MG tablet Commonly known as:  DELTASONE Take 1 tablet (10 mg total) by mouth daily.   tiotropium 18 MCG inhalation capsule Commonly known as:  SPIRIVA Place 1 capsule (18 mcg total) into inhaler and inhale daily.       Allergies  Allergen Reactions  . Codeine Nausea And Vomiting    Consultations:  None   Procedures/Studies: Dg Chest 2 View  Result Date: 08/14/2017 CLINICAL DATA:  Chest pain.  History of COPD. EXAM: CHEST - 2 VIEW COMPARISON:  Chest radiograph August 13, 2017 FINDINGS: Cardiomediastinal silhouette is normal. No pleural effusions or focal consolidations. Similar hyperinflation with flattened hemidiaphragms. Trachea projects midline and there is no pneumothorax. Soft tissue planes and included osseous structures are non-suspicious. Old RIGHT rib fractures. IMPRESSION: COPD.  No focal consolidation. Electronically Signed   By: Elon Alas M.D.   On: 08/14/2017 22:27   Dg Chest 2 View  Result Date: 08/13/2017 CLINICAL DATA:  Increased shortness of breath on exertion, lightheadedness, and chest tightness. Recently started on prednisone. History of COPD. EXAM: CHEST - 2 VIEW COMPARISON:  07/17/2017 FINDINGS: Hyperinflation consistent with emphysema. No airspace  disease or consolidation in the lungs. No blunting of costophrenic angles. No pneumothorax. Mediastinal contours appear intact. Normal heart size and pulmonary vascularity. Old right rib fractures. IMPRESSION: Emphysematous changes in the lungs. No evidence of active pulmonary disease. Electronically Signed   By: Lucienne Capers M.D.   On: 08/13/2017 01:25   Ct Angio Chest Pe W Or Wo Contrast  Result Date: 08/24/2017 CLINICAL DATA:  62 y/o M; worsening shortness of breath. History of COPD. Elevated D-dimer. EXAM: CT ANGIOGRAPHY CHEST WITH CONTRAST TECHNIQUE: Multidetector CT imaging of the chest was performed using the standard protocol during bolus administration of intravenous contrast. Multiplanar CT image reconstructions and MIPs were obtained to evaluate the vascular anatomy. CONTRAST:  141mL ISOVUE-370 IOPAMIDOL (ISOVUE-370) INJECTION 76% COMPARISON:  06/26/2017 CT angiogram of the chest. FINDINGS: Cardiovascular: Satisfactory opacification of the pulmonary arteries to the segmental level. No evidence of pulmonary embolism. Normal heart size. No pericardial effusion. Mild coronary artery calcific atherosclerosis. Mediastinum/Nodes: No enlarged mediastinal, hilar, or axillary lymph  nodes. Thyroid gland, trachea, and esophagus demonstrate no significant findings. Lungs/Pleura: Lungs are clear. No pleural effusion or pneumothorax. Stable emphysema. Upper Abdomen: No acute abnormality. Musculoskeletal: No chest wall abnormality. No acute or significant osseous findings. Multiple stable chronic right posterior rib fractures. Review of the MIP images confirms the above findings. IMPRESSION: 1. No pulmonary embolus identified.  No acute pulmonary process. 2. Stable emphysema. Electronically Signed   By: Kristine Garbe M.D.   On: 08/24/2017 23:34   Nm Bone Scan Whole Body  Result Date: 09/01/2017 CLINICAL DATA:  Prostate carcinoma, elevated PSA. History of rib fractures, colon carcinoma EXAM: NUCLEAR  MEDICINE WHOLE BODY BONE SCAN TECHNIQUE: Whole body anterior and posterior images were obtained approximately 3 hours after intravenous injection of radiopharmaceutical. RADIOPHARMACEUTICALS:  21.1 mCi Technetium-30m MDP IV COMPARISON:  CT 08/31/2017 FINDINGS: Focus of increased activity in the region of the lateral left femoral condyle near the knee, suggesting DJD. Otherwise physiologic distribution of radiopharmaceutical. IMPRESSION: No suggestion of osseous metastatic disease. Electronically Signed   By: Lucrezia Europe M.D.   On: 09/01/2017 15:29   Ct Abdomen Pelvis W Contrast  Result Date: 08/31/2017 CLINICAL DATA:  Prostate cancer. High risk. Staging. EXAM: CT ABDOMEN AND PELVIS WITH CONTRAST TECHNIQUE: Multidetector CT imaging of the abdomen and pelvis was performed using the standard protocol following bolus administration of intravenous contrast. CONTRAST:  151mL OMNIPAQUE IOHEXOL 300 MG/ML  SOLN COMPARISON:  04/18/2017 FINDINGS: Lower chest: No acute abnormality. Hepatobiliary: No focal liver abnormality is seen. Tiny stone within the dependent portion of the gallbladder noted, image 25/3. No gallbladder wall thickening. No biliary dilatation identified. Pancreas: Unremarkable. No pancreatic ductal dilatation or surrounding inflammatory changes. Spleen: Normal in size without focal abnormality. Adrenals/Urinary Tract: Normal adrenal glands. Small bilateral kidney cysts identified. No mass or hydronephrosis. The urinary bladder appears normal. Stomach/Bowel: Stomach is normal. The small bowel loops have a normal course and caliber. No dilated loops of small bowel. Distal colonic diverticulosis identified without acute inflammation. Vascular/Lymphatic: Aortic atherosclerosis. No aneurysm. No adenopathy identified within the abdomen. No pelvic or inguinal adenopathy. Reproductive: Prostate is unremarkable. Other: No abdominopelvic ascites. There is a left inguinal hernia which contains fat only.  Musculoskeletal: Degenerative disc disease noted within the lumbar spine. No suspicious bone lesions identified. IMPRESSION: 1. No findings identified to suggest metastatic disease from prostate cancer. 2. Kidney cysts 3.  Aortic Atherosclerosis (ICD10-I70.0). Electronically Signed   By: Kerby Moors M.D.   On: 08/31/2017 17:58   Dg Chest Portable 1 View  Result Date: 09/04/2017 CLINICAL DATA:  62 y/o  M; shortness of breath and chest pain. EXAM: PORTABLE CHEST 1 VIEW COMPARISON:  08/30/2017 chest radiograph. FINDINGS: Normal cardiac silhouette. Stable emphysema and hyperinflation of the lungs. No consolidation. Chronic blunting of left costodiaphragmatic angle. No pleural effusion or pneumothorax. No acute osseous abnormality is evident. IMPRESSION: No active disease.  Stable emphysema. Electronically Signed   By: Kristine Garbe M.D.   On: 09/04/2017 20:28   Dg Chest Port 1 View  Result Date: 08/30/2017 CLINICAL DATA:  Acute onset of respiratory distress. EXAM: PORTABLE CHEST 1 VIEW COMPARISON:  Chest radiograph and CTA of the chest performed 08/24/2017 FINDINGS: The lungs are hyperexpanded, with changes of emphysema at the upper lung lobes. There is no evidence of focal opacification, pleural effusion or pneumothorax. The cardiomediastinal silhouette is within normal limits. No acute osseous abnormalities are seen. IMPRESSION: Findings of emphysema at the upper lung lobes. No acute cardiopulmonary process seen. Electronically Signed  By: Garald Balding M.D.   On: 08/30/2017 22:17   Dg Chest Port 1 View  Result Date: 08/24/2017 CLINICAL DATA:  Shortness of breath today, history COPD, asthma, pneumonia, prostate cancer, former smoker EXAM: PORTABLE CHEST 1 VIEW COMPARISON:  Portable exam 1822 hours compared to 08/14/2017 FINDINGS: Normal heart size, mediastinal contours, and pulmonary vascularity. Lungs emphysematous but clear. No pulmonary infiltrate, pleural effusion or pneumothorax. Bones  demineralized with old healed fractures of the posterior RIGHT third fourth and fifth ribs. IMPRESSION: COPD changes without acute infiltrate. Electronically Signed   By: Lavonia Dana M.D.   On: 08/24/2017 18:38      Subjective: Feeling tired, but dyspnea is improved back to baselien.  No chest pain, sputum production, fever.  Discharge Exam: Vitals:   09/07/17 0501 09/07/17 0902  BP: 135/83   Pulse: 79   Resp:    Temp: 98.1 F (36.7 C)   SpO2: 98% 97%   Vitals:   09/06/17 1953 09/06/17 1956 09/07/17 0501 09/07/17 0902  BP:  113/84 135/83   Pulse: 87 87 79   Resp:  (!) 25    Temp:  97.7 F (36.5 C) 98.1 F (36.7 C)   TempSrc:  Oral Oral   SpO2: 98% 96% 98% 97%  Weight:   71 kg   Height:        General: Pt is alert, awake, not in acute distressambulating in hall Cardiovascular: RRR, S1/S2 +, no rubs, no gallops Respiratory: CTA bilaterally, wheezing bilaterally Abdominal: Soft, NT, ND, bowel sounds + Extremities: no edema, no cyanosis    The results of significant diagnostics from this hospitalization (including imaging, microbiology, ancillary and laboratory) are listed below for reference.     Microbiology: Recent Results (from the past 240 hour(s))  MRSA PCR Screening     Status: None   Collection Time: 09/05/17  6:50 AM  Result Value Ref Range Status   MRSA by PCR NEGATIVE NEGATIVE Final    Comment:        The GeneXpert MRSA Assay (FDA approved for NASAL specimens only), is one component of a comprehensive MRSA colonization surveillance program. It is not intended to diagnose MRSA infection nor to guide or monitor treatment for MRSA infections. Performed at [facility], [place] [address] [place], [place] [address]      Labs: BNP (last 3 results) Recent Labs    05/28/17 2217 06/13/17 1003 08/24/17 1838  BNP 28.9 33.8 27.0   Basic Metabolic Panel: Recent Labs  Lab 09/04/17 2028 09/05/17 0358 09/06/17 0448 09/07/17 0736  NA 140 138  137 139  K 4.1 5.4* 4.4 4.6  CL 99 97* 96* 96*  CO2 32 35* 35* 36*  GLUCOSE 134* 263* 289* 202*  BUN 22 24* 21 16  CREATININE 0.99 0.97 0.74 0.84  CALCIUM 8.2* 8.2* 8.7* 9.0   Liver Function Tests: Recent Labs  Lab 09/06/17 0448  AST 13*  ALT 23  ALKPHOS 48  BILITOT 0.6  PROT 5.2*  ALBUMIN 3.1*   No results for input(s): LIPASE, AMYLASE in the last 168 hours. No results for input(s): AMMONIA in the last 168 hours. CBC: Recent Labs  Lab 09/04/17 2028 09/06/17 0448 09/07/17 0736  WBC 14.1* 18.0* 18.3*  HGB 14.6 12.6* 14.3  HCT 46.6 38.9* 45.1  MCV 97.5 93.1 94.0  PLT 208 190 225   Cardiac Enzymes: Recent Labs  Lab 09/04/17 2028  TROPONINI <0.03   BNP: Invalid input(s): POCBNP CBG: Recent Labs  Lab  09/06/17 1644 09/06/17 2154 09/07/17 0801 09/07/17 1547 09/07/17 1618  GLUCAP 367* 258* 220* 238* 240*   D-Dimer No results for input(s): DDIMER in the last 72 hours. Hgb A1c Recent Labs    09/06/17 0448  HGBA1C 7.2*   Lipid Profile No results for input(s): CHOL, HDL, LDLCALC, TRIG, CHOLHDL, LDLDIRECT in the last 72 hours. Thyroid function studies No results for input(s): TSH, T4TOTAL, T3FREE, THYROIDAB in the last 72 hours.  Invalid input(s): FREET3 Anemia work up No results for input(s): VITAMINB12, FOLATE, FERRITIN, TIBC, IRON, RETICCTPCT in the last 72 hours. Urinalysis    Component Value Date/Time   COLORURINE YELLOW 06/13/2017 0203   APPEARANCEUR CLEAR 06/13/2017 0203   LABSPEC 1.018 06/13/2017 0203   PHURINE 6.0 06/13/2017 0203   GLUCOSEU 50 (A) 06/13/2017 0203   HGBUR NEGATIVE 06/13/2017 0203   BILIRUBINUR NEGATIVE 06/13/2017 0203   KETONESUR NEGATIVE 06/13/2017 0203   PROTEINUR NEGATIVE 06/13/2017 0203   UROBILINOGEN 1.0 01/09/2013 1843   NITRITE NEGATIVE 06/13/2017 0203   LEUKOCYTESUR NEGATIVE 06/13/2017 0203   Sepsis Labs Invalid input(s): PROCALCITONIN,  WBC,  LACTICIDVEN Microbiology Recent Results (from the past 240 hour(s))   MRSA PCR Screening     Status: None   Collection Time: 09/05/17  6:50 AM  Result Value Ref Range Status   MRSA by PCR NEGATIVE NEGATIVE Final    Comment:        The GeneXpert MRSA Assay (FDA approved for NASAL specimens only), is one component of a comprehensive MRSA colonization surveillance program. It is not intended to diagnose MRSA infection nor to guide or monitor treatment for MRSA infections. Performed at [facility], [place] [address] [place], [place] [address]      Time coordinating discharge: 40 minutes       SIGNED:   Edwin Dada, MD  [facility] 09/07/2017, 5:25 PM

## 2017-09-08 ENCOUNTER — Ambulatory Visit: Payer: Self-pay

## 2017-09-10 ENCOUNTER — Emergency Department (HOSPITAL_COMMUNITY): Payer: Medicaid Other

## 2017-09-10 ENCOUNTER — Encounter (HOSPITAL_COMMUNITY): Payer: Self-pay

## 2017-09-10 ENCOUNTER — Inpatient Hospital Stay (HOSPITAL_COMMUNITY)
Admission: EM | Admit: 2017-09-10 | Discharge: 2017-09-12 | DRG: 190 | Disposition: A | Discharge status: Home / Self Care | Payer: Medicaid Other | Attending: Internal Medicine | Admitting: Internal Medicine

## 2017-09-10 ENCOUNTER — Other Ambulatory Visit: Payer: Self-pay

## 2017-09-10 DIAGNOSIS — Z885 Allergy status to narcotic agent status: Secondary | ICD-10-CM

## 2017-09-10 DIAGNOSIS — Z79899 Other long term (current) drug therapy: Secondary | ICD-10-CM

## 2017-09-10 DIAGNOSIS — Z8701 Personal history of pneumonia (recurrent): Secondary | ICD-10-CM

## 2017-09-10 DIAGNOSIS — Z87891 Personal history of nicotine dependence: Secondary | ICD-10-CM

## 2017-09-10 DIAGNOSIS — F319 Bipolar disorder, unspecified: Secondary | ICD-10-CM | POA: Diagnosis present

## 2017-09-10 DIAGNOSIS — J441 Chronic obstructive pulmonary disease with (acute) exacerbation: Secondary | ICD-10-CM | POA: Diagnosis present

## 2017-09-10 DIAGNOSIS — F329 Major depressive disorder, single episode, unspecified: Secondary | ICD-10-CM

## 2017-09-10 DIAGNOSIS — Z818 Family history of other mental and behavioral disorders: Secondary | ICD-10-CM

## 2017-09-10 DIAGNOSIS — Z7951 Long term (current) use of inhaled steroids: Secondary | ICD-10-CM

## 2017-09-10 DIAGNOSIS — Z9981 Dependence on supplemental oxygen: Secondary | ICD-10-CM

## 2017-09-10 DIAGNOSIS — Z590 Homelessness: Secondary | ICD-10-CM

## 2017-09-10 DIAGNOSIS — I110 Hypertensive heart disease with heart failure: Secondary | ICD-10-CM | POA: Diagnosis present

## 2017-09-10 DIAGNOSIS — Z7952 Long term (current) use of systemic steroids: Secondary | ICD-10-CM

## 2017-09-10 DIAGNOSIS — Z801 Family history of malignant neoplasm of trachea, bronchus and lung: Secondary | ICD-10-CM

## 2017-09-10 DIAGNOSIS — I5032 Chronic diastolic (congestive) heart failure: Secondary | ICD-10-CM | POA: Diagnosis present

## 2017-09-10 DIAGNOSIS — F419 Anxiety disorder, unspecified: Secondary | ICD-10-CM | POA: Diagnosis present

## 2017-09-10 DIAGNOSIS — Z9049 Acquired absence of other specified parts of digestive tract: Secondary | ICD-10-CM

## 2017-09-10 DIAGNOSIS — Z9221 Personal history of antineoplastic chemotherapy: Secondary | ICD-10-CM

## 2017-09-10 DIAGNOSIS — C61 Malignant neoplasm of prostate: Secondary | ICD-10-CM | POA: Diagnosis present

## 2017-09-10 DIAGNOSIS — Z8249 Family history of ischemic heart disease and other diseases of the circulatory system: Secondary | ICD-10-CM

## 2017-09-10 DIAGNOSIS — E119 Type 2 diabetes mellitus without complications: Secondary | ICD-10-CM | POA: Diagnosis present

## 2017-09-10 DIAGNOSIS — Z7984 Long term (current) use of oral hypoglycemic drugs: Secondary | ICD-10-CM

## 2017-09-10 DIAGNOSIS — R45851 Suicidal ideations: Secondary | ICD-10-CM | POA: Diagnosis present

## 2017-09-10 DIAGNOSIS — E785 Hyperlipidemia, unspecified: Secondary | ICD-10-CM | POA: Diagnosis present

## 2017-09-10 DIAGNOSIS — Z85038 Personal history of other malignant neoplasm of large intestine: Secondary | ICD-10-CM

## 2017-09-10 DIAGNOSIS — J9621 Acute and chronic respiratory failure with hypoxia: Secondary | ICD-10-CM | POA: Diagnosis present

## 2017-09-10 LAB — CBC WITH DIFFERENTIAL/PLATELET
ABS IMMATURE GRANULOCYTES: 0.5 10*3/uL — AB (ref 0.0–0.1)
BASOS ABS: 0.1 10*3/uL (ref 0.0–0.1)
BASOS PCT: 1 %
Eosinophils Absolute: 0.2 10*3/uL (ref 0.0–0.7)
Eosinophils Relative: 1 %
HCT: 47 % (ref 39.0–52.0)
Hemoglobin: 14.8 g/dL (ref 13.0–17.0)
IMMATURE GRANULOCYTES: 4 %
Lymphocytes Relative: 14 %
Lymphs Abs: 2 10*3/uL (ref 0.7–4.0)
MCH: 30.6 pg (ref 26.0–34.0)
MCHC: 31.5 g/dL (ref 30.0–36.0)
MCV: 97.1 fL (ref 78.0–100.0)
Monocytes Absolute: 1 10*3/uL (ref 0.1–1.0)
Monocytes Relative: 7 %
NEUTROS ABS: 10.7 10*3/uL — AB (ref 1.7–7.7)
NEUTROS PCT: 73 %
PLATELETS: 240 10*3/uL (ref 150–400)
RBC: 4.84 MIL/uL (ref 4.22–5.81)
RDW: 12.4 % (ref 11.5–15.5)
WBC: 14.5 10*3/uL — AB (ref 4.0–10.5)

## 2017-09-10 LAB — BASIC METABOLIC PANEL
ANION GAP: 8 (ref 5–15)
BUN: 13 mg/dL (ref 8–23)
CALCIUM: 8.8 mg/dL — AB (ref 8.9–10.3)
CO2: 40 mmol/L — ABNORMAL HIGH (ref 22–32)
Chloride: 93 mmol/L — ABNORMAL LOW (ref 98–111)
Creatinine, Ser: 0.87 mg/dL (ref 0.61–1.24)
GFR calc Af Amer: >60 mL/min (ref >60)
Glucose, Bld: 241 mg/dL — ABNORMAL HIGH (ref 70–99)
POTASSIUM: 4.2 mmol/L (ref 3.5–5.1)
SODIUM: 141 mmol/L (ref 135–145)

## 2017-09-10 LAB — HEPATIC FUNCTION PANEL
ALT: 24 U/L (ref 0–44)
AST: 18 U/L (ref 15–41)
Albumin: 3.9 g/dL (ref 3.5–5.0)
Alkaline Phosphatase: 61 U/L (ref 38–126)
BILIRUBIN INDIRECT: 0.5 mg/dL (ref 0.3–0.9)
Bilirubin, Direct: 0.1 mg/dL (ref 0.0–0.2)
Total Bilirubin: 0.6 mg/dL (ref 0.3–1.2)
Total Protein: 6 g/dL — ABNORMAL LOW (ref 6.5–8.1)

## 2017-09-10 LAB — TROPONIN I

## 2017-09-10 LAB — BRAIN NATRIURETIC PEPTIDE: B Natriuretic Peptide: 94.6 pg/mL (ref 0.0–100.0)

## 2017-09-10 MED ORDER — IPRATROPIUM-ALBUTEROL 0.5-2.5 (3) MG/3ML IN SOLN
3.0000 mL | RESPIRATORY_TRACT | Status: AC
  Administered 2017-09-10 (×2): 3 mL via RESPIRATORY_TRACT
  Filled 2017-09-10 (×2): qty 3

## 2017-09-10 NOTE — ED Provider Notes (Signed)
[facility] EMERGENCY DEPARTMENT Provider Note   CSN: [id number] Arrival date & time: 09/10/17  2203     History   Chief Complaint Chief Complaint  Patient presents with  . Respiratory Distress    HPI Taylor [name] is a 62 y.o. male with asthma, bipolar disorder, COPD, and emphysema who presents due to severe respiratory distress.  He says that he has been feeling short of breath for the past several days and has had a increased productive cough.  EMS reports that upon arrival, he was tripoding and satting in the low 90s.  They provided albuterol, Atrovent, and Solu-Medrol and placed him on CPAP.  He says that he is feeling slightly better since his interventions.  He reports having some intermittent chest pain over the past week.  He says that his chest pain is mild and dull.  He is unable to provide any further history regarding his chest pain.  He denies nausea and diaphoresis.  HPI  Past Medical History:  Diagnosis Date  . Asthma   . Bipolar 1 disorder ([place]) 02/05/2012  . Colon cancer ([employer]) 04/11/11   adenocarcinoma of colon, 7/19 nodes pos.FINISHED CHEMO/DR. SHERRILL  . COPD (chronic obstructive pulmonary disease) ([place])    SMOKER  . Depression   . Emphysema of lung ([place])   . Full dentures   . Hemorrhoids   . On home oxygen therapy    "3L; 24/7" (05/29/2017)  . Pneumonia ~ 2016   "double pneumonia"  . Prostate cancer ([place])    Gleason score = 7, supposed to have radiation therapy but he has not followed up (05/29/2017)  . Rib fractures    hx of    Patient Active Problem List   Diagnosis Date Noted  . Prostate cancer ([place]) 09/04/2017  . Acute respiratory failure with hypercapnia ([place])   . SOB (shortness of breath)   . Malnutrition of moderate degree 06/14/2017  . Hyperglycemia 06/08/2017  . Constipation 06/08/2017  . SIRS (systemic inflammatory response syndrome) ([place]) 05/21/2017  . Tobacco abuse 05/13/2017  . HLD (hyperlipidemia) 05/13/2017    . Leukocytosis 04/06/2017  . Adjustment disorder with depressed mood 03/28/2017  . Adjustment disorder with mixed disturbance of emotions and conduct   . Acute on chronic respiratory failure with hypercapnia ([place]) 03/24/2017  . Normocytic normochromic anemia 03/24/2017  . COPD with acute exacerbation ([place]) 10/22/2016  . Alcohol abuse 01/09/2013  . COPD exacerbation ([place]) 01/09/2013  . Smoker 12/16/2012  . Inguinal hernia unilateral, non-recurrent, right 05/01/2012  . Dyspepsia 02/05/2012  . Bipolar 1 disorder ([place]) 02/05/2012  . 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 COPD  GOLD III 02/03/2012  . Thrombocytopenia ([place]) 02/03/2012  . Depression   . 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 COLON SURGERY  04/11/11   Sigmoid colectomy  . COLOSTOMY REVISION  04/11/2011   Procedure: COLON RESECTION SIGMOID;  Surgeon: [name], MD;  Location: [facility];  Service: General;  Laterality: N/A;  low anterior colon resection   . HERNIA REPAIR    . INGUINAL HERNIA REPAIR Right 05/01/2012   Procedure: HERNIA REPAIR INGUINAL ADULT;  Surgeon: [name], MD;  Location: [facility];  Service: General;  Laterality: Right;  . INSERTION OF MESH Right 05/01/2012   Procedure: INSERTION OF MESH;  Surgeon: [name], MD;  Location: [facility];  Service: General;  Laterality: Right;  . PORT-A-CATH REMOVAL Left 05/01/2012   Procedure:  REMOVAL Infusion Port;  Surgeon: [name], MD;  Location: [facility];  Service: General;  Laterality: Left;  . PORTACATH PLACEMENT  05/02/2011   Procedure: INSERTION PORT-A-CATH;  Surgeon: [name], MD;  Location: [facility];  Service: General;  Laterality: N/A;  . PROSTATE BIOPSY          Home Medications    Prior to Admission medications   Medication Sig Start Date End Date Taking? Authorizing Provider  albuterol (PROVENTIL HFA;VENTOLIN HFA) 108 (90 Base) MCG/ACT inhaler Inhale 2 puffs into the lungs every 6  (six) hours as needed for wheezing or shortness of breath. 08/26/17   Isabelle Course, MD  albuterol (PROVENTIL) (5 MG/ML) 0.5% nebulizer solution Take 1 mL (5 mg total) by nebulization every 6 (six) hours as needed for wheezing or shortness of breath. 09/07/17   Danford, Suann Larry, MD  ARIPiprazole (ABILIFY) 5 MG tablet Take 1 tablet (5 mg total) by mouth daily with breakfast. 08/27/17   Isabelle Course, MD  azithromycin (ZITHROMAX Z-PAK) 250 MG tablet Take 2 tablets (500 mg) on  Day 1,  followed by 1 tablet (250 mg) once daily on Days 2 through 5. 09/07/17 09/12/17  Danford, Suann Larry, MD  blood glucose meter kit and supplies KIT Dispense based on patient and insurance preference. Use up to four times daily as directed. (FOR ICD-9 250.00, 250.01). 09/07/17   Danford, Suann Larry, MD  escitalopram (LEXAPRO) 10 MG tablet Take 1 tablet (10 mg total) by mouth daily. 08/27/17   Isabelle Course, MD  lovastatin (MEVACOR) 20 MG tablet Take 1 tablet (20 mg total) by mouth at bedtime. 05/18/17   Nita Sells, MD  metFORMIN (GLUCOPHAGE) 500 MG tablet Take 1 tablet (500 mg total) by mouth 2 (two) times daily with a meal. 09/07/17   Danford, Suann Larry, MD  mometasone-formoterol (DULERA) 100-5 MCG/ACT AERO Inhale 2 puffs into the lungs 2 (two) times daily. 05/18/17   Nita Sells, MD  OXYGEN Inhale 3 L into the lungs.     [provider]  predniSONE (DELTASONE) 10 MG tablet Take 1 tablet (10 mg total) by mouth daily. 09/07/17   Danford, Suann Larry, MD  tiotropium (SPIRIVA HANDIHALER) 18 MCG inhalation capsule Place 1 capsule (18 mcg total) into inhaler and inhale daily. 08/29/17   Mesner, Corene Cornea, MD    Family History Family History  Problem Relation Age of Onset  . Heart disease Father   . Lung cancer Maternal Uncle        smoked    Social History Social History   Tobacco Use  . Smoking status: Former Smoker    Packs/day: 0.10    Years: 46.00    Pack years: 4.60    Types:  Cigarettes    Last attempt to quit: 05/21/2017    Years since quitting: 0.3  . Smokeless tobacco: Former Systems developer    Types: Chew  Substance Use Topics  . Alcohol use: Not Currently    Comment: h/o use in the past, no h/o heavy use  . Drug use: No     Allergies   Codeine   Review of Systems Review of Systems Review of Systems   Constitutional  Negative for fever  Negative for chills  HENT  Negative for ear pain  Negative for sore throat  Negative for difficultly swallowing  Eyes  Negative for eye pain  Negative for visual disturbance  Respiratory  +for shortness of breath  +for cough  CV  +for  chest pain  Negative for leg swelling  Abdomen  Negative for abdominal pain  Negative for nausea  Negative for vomiting  MSK  Negative for extremity pain  Negative for back pain  Skin  Negative for rash  Negative for wound  Neuro  Negative for syncope  Negative for difficultly speaking  Psych  Negative for confusion   The remainder of the ROS was reviewed and negative except as documented above.      Physical Exam Updated Vital Signs BP (!) 140/98   Pulse (!) 134   Temp 98.6 F (37 C) (Oral)   Resp (!) 40   SpO2 96%   Physical Exam Physical Exam Constitutional  Nursing notes reviewed  Vital signs reviewed  Moderate distress  HEENT  No obvious trauma  Supple without meningismus, mass, or overt JVD  EOMI  No scleral icterus or injection  Respiratory  Moderate respiratory distress  Intercostal retractions  Diffuse wheezes in all lung fields  CV  Tachycardic  No obvious murmurs  No pitting edema  Abdomen  Soft  Non-tender  Non-distended  No peritonitis  MSK  Atraumatic  No obvious deformity  ROM appropriate  Skin  Warm  Dry  Neuro  Awake and alert  EOMI  Moving all extremities  Psychiatric  Mood and affect normal        ED Treatments / Results  Labs (all labs ordered are listed, but only abnormal  results are displayed) Labs Reviewed  CBC WITH DIFFERENTIAL/PLATELET - Abnormal; Notable for the following components:      Result Value   WBC 14.5 (*)    Neutro Abs 10.7 (*)    Abs Immature Granulocytes 0.5 (*)    All other components within normal limits  BASIC METABOLIC PANEL - Abnormal; Notable for the following components:   Chloride 93 (*)    CO2 40 (*)    Glucose, Bld 241 (*)    Calcium 8.8 (*)    All other components within normal limits  HEPATIC FUNCTION PANEL - Abnormal; Notable for the following components:   Total Protein 6.0 (*)    All other components within normal limits  BRAIN NATRIURETIC PEPTIDE  TROPONIN I  BLOOD GAS, VENOUS    EKG EKG Interpretation  Date/Time:  Wednesday September 10 2017 22:13:34 EDT Ventricular Rate:  126 PR Interval:    QRS Duration: 70 QT Interval:  284 QTC Calculation: 412 R Axis:   -59 Text Interpretation:  Sinus tachycardia Multiform ventricular premature complexes Probable left atrial enlargement Left anterior fascicular block Anteroseptal infarct, age indeterminate No significant change since last tracing Confirmed by Deno Etienne [phone]) on 09/10/2017 11:01:02 PM   Radiology Dg Chest Portable 1 View  Result Date: 09/10/2017 CLINICAL DATA:  Shortness of breath. EXAM: PORTABLE CHEST 1 VIEW COMPARISON:  Multiple prior exams. Most recent radiograph 09/04/2017. Most recent CT 08/24/2017 FINDINGS: Chronic hyperinflation with emphysema. Normal heart size and mediastinal contours. No focal airspace opacity, pleural effusion, pneumothorax or pulmonary edema. Remote right rib fractures again seen. IMPRESSION: 1. No acute findings. 2.  Emphysema (ICD10-J43.9). Electronically Signed   By: Jeb Levering M.D.   On: 09/10/2017 22:40    Procedures Procedures (including critical care time)  Medications Ordered in ED Medications  ipratropium-albuterol (DUONEB) 0.5-2.5 (3) MG/3ML nebulizer solution 3 mL (3 mLs Nebulization Given 09/10/17 2327)      Initial Impression / Assessment and Plan / ED Course  I have reviewed the triage vital signs and the nursing notes.  Pertinent labs & imaging results that were available during my care of the patient were reviewed by me and considered in my medical decision making (see chart for details).    AYDENN GERVIN presents due to severe respiratory distress.  Currently, he is on BiPAP and satting normally.  He says this feels like his prior COPD exacerbations.  He reporting productive cough and increased shortness of breath are consistent with this.  Through record review, he was discharged less than a week ago after being admitted for COPD exacerbation.  It is not consistent with a CHF exacerbation (BNP 94), AAA, or aortic dissection.  He was already given steroids.  We will defer an microbial/anti-inflammatory antibiotics for the inpatient team.  He was given additional Duo-Nebs in the ED. His ECG reveals no evidence of acute ischemia.  His troponin is not elevated.  He has a mild leukocytosis to 14.5.  He has no other significant abnormalities on CBC or CMP.  His chest x-ray reveals emphysema but no other acute findings.  I have a very low suspicion for PE given his overall history and presentation, consisting of a productive cough and wheezing.  I do not think that further work-up for this is currently indicated based off his presentation at this time.  He was admitted to the hospitalist service in stable condition.   Final Clinical Impressions(s) / ED Diagnoses   Final diagnoses:  COPD exacerbation [facility])    ED Discharge Orders    None       Alford Highland, MD 09/11/17 0010    Deno Etienne, DO 09/11/17 0165

## 2017-09-10 NOTE — ED Triage Notes (Signed)
Pt comes from home via [facility] for resp distress on CPAP. Sudden onset os SOB, took home inhaler without relief, upon EMS arrival pt in tripod position, assessory muscle use, rhonchi all over. PTA  5 mg albuterol, 0.5 Atrovent, 125 solumedrol

## 2017-09-11 ENCOUNTER — Encounter (HOSPITAL_COMMUNITY): Payer: Self-pay | Admitting: *Deleted

## 2017-09-11 DIAGNOSIS — F319 Bipolar disorder, unspecified: Secondary | ICD-10-CM

## 2017-09-11 DIAGNOSIS — Z8249 Family history of ischemic heart disease and other diseases of the circulatory system: Secondary | ICD-10-CM | POA: Diagnosis not present

## 2017-09-11 DIAGNOSIS — R0602 Shortness of breath: Secondary | ICD-10-CM | POA: Diagnosis not present

## 2017-09-11 DIAGNOSIS — Z590 Homelessness: Secondary | ICD-10-CM

## 2017-09-11 DIAGNOSIS — Z8701 Personal history of pneumonia (recurrent): Secondary | ICD-10-CM | POA: Diagnosis not present

## 2017-09-11 DIAGNOSIS — Z9049 Acquired absence of other specified parts of digestive tract: Secondary | ICD-10-CM | POA: Diagnosis not present

## 2017-09-11 DIAGNOSIS — Z818 Family history of other mental and behavioral disorders: Secondary | ICD-10-CM | POA: Diagnosis not present

## 2017-09-11 DIAGNOSIS — Z9981 Dependence on supplemental oxygen: Secondary | ICD-10-CM | POA: Diagnosis not present

## 2017-09-11 DIAGNOSIS — J449 Chronic obstructive pulmonary disease, unspecified: Secondary | ICD-10-CM

## 2017-09-11 DIAGNOSIS — R45851 Suicidal ideations: Secondary | ICD-10-CM | POA: Diagnosis present

## 2017-09-11 DIAGNOSIS — Z801 Family history of malignant neoplasm of trachea, bronchus and lung: Secondary | ICD-10-CM | POA: Diagnosis not present

## 2017-09-11 DIAGNOSIS — Z9221 Personal history of antineoplastic chemotherapy: Secondary | ICD-10-CM | POA: Diagnosis not present

## 2017-09-11 DIAGNOSIS — E785 Hyperlipidemia, unspecified: Secondary | ICD-10-CM | POA: Diagnosis present

## 2017-09-11 DIAGNOSIS — I110 Hypertensive heart disease with heart failure: Secondary | ICD-10-CM | POA: Diagnosis present

## 2017-09-11 DIAGNOSIS — E119 Type 2 diabetes mellitus without complications: Secondary | ICD-10-CM | POA: Diagnosis present

## 2017-09-11 DIAGNOSIS — F329 Major depressive disorder, single episode, unspecified: Secondary | ICD-10-CM | POA: Diagnosis not present

## 2017-09-11 DIAGNOSIS — Z7952 Long term (current) use of systemic steroids: Secondary | ICD-10-CM | POA: Diagnosis not present

## 2017-09-11 DIAGNOSIS — J441 Chronic obstructive pulmonary disease with (acute) exacerbation: Principal | ICD-10-CM

## 2017-09-11 DIAGNOSIS — Z87891 Personal history of nicotine dependence: Secondary | ICD-10-CM | POA: Diagnosis not present

## 2017-09-11 DIAGNOSIS — F419 Anxiety disorder, unspecified: Secondary | ICD-10-CM | POA: Diagnosis present

## 2017-09-11 DIAGNOSIS — J9621 Acute and chronic respiratory failure with hypoxia: Secondary | ICD-10-CM

## 2017-09-11 DIAGNOSIS — C61 Malignant neoplasm of prostate: Secondary | ICD-10-CM

## 2017-09-11 DIAGNOSIS — Z885 Allergy status to narcotic agent status: Secondary | ICD-10-CM | POA: Diagnosis not present

## 2017-09-11 DIAGNOSIS — Z7951 Long term (current) use of inhaled steroids: Secondary | ICD-10-CM | POA: Diagnosis not present

## 2017-09-11 DIAGNOSIS — I5032 Chronic diastolic (congestive) heart failure: Secondary | ICD-10-CM | POA: Diagnosis present

## 2017-09-11 DIAGNOSIS — Z7984 Long term (current) use of oral hypoglycemic drugs: Secondary | ICD-10-CM | POA: Diagnosis not present

## 2017-09-11 DIAGNOSIS — Z85038 Personal history of other malignant neoplasm of large intestine: Secondary | ICD-10-CM | POA: Diagnosis not present

## 2017-09-11 DIAGNOSIS — Z560 Unemployment, unspecified: Secondary | ICD-10-CM

## 2017-09-11 LAB — CBG MONITORING, ED
GLUCOSE-CAPILLARY: 263 mg/dL — AB (ref 70–99)
GLUCOSE-CAPILLARY: 263 mg/dL — AB (ref 70–99)
Glucose-Capillary: 328 mg/dL — ABNORMAL HIGH (ref 70–99)

## 2017-09-11 LAB — GLUCOSE, CAPILLARY
Glucose-Capillary: 260 mg/dL — ABNORMAL HIGH (ref 70–99)
Glucose-Capillary: 117 mg/dL — ABNORMAL HIGH (ref 70–99)

## 2017-09-11 MED ORDER — LEVALBUTEROL HCL 1.25 MG/0.5ML IN NEBU
1.2500 mg | INHALATION_SOLUTION | Freq: Four times a day (QID) | RESPIRATORY_TRACT | Status: DC
  Administered 2017-09-11: 1.25 mg via RESPIRATORY_TRACT
  Filled 2017-09-11: qty 0.5

## 2017-09-11 MED ORDER — TIOTROPIUM BROMIDE MONOHYDRATE 18 MCG IN CAPS
18.0000 ug | ORAL_CAPSULE | Freq: Every day | RESPIRATORY_TRACT | Status: DC
  Filled 2017-09-11 (×2): qty 5

## 2017-09-11 MED ORDER — ALPRAZOLAM 0.5 MG PO TABS
0.5000 mg | ORAL_TABLET | Freq: Three times a day (TID) | ORAL | Status: DC | PRN
  Administered 2017-09-11: 0.5 mg via ORAL
  Filled 2017-09-11: qty 1

## 2017-09-11 MED ORDER — PREDNISONE 50 MG PO TABS
50.0000 mg | ORAL_TABLET | Freq: Every day | ORAL | Status: DC
  Administered 2017-09-11 – 2017-09-12 (×2): 50 mg via ORAL
  Filled 2017-09-11: qty 3
  Filled 2017-09-11 (×2): qty 1

## 2017-09-11 MED ORDER — ESCITALOPRAM OXALATE 10 MG PO TABS
10.0000 mg | ORAL_TABLET | Freq: Every day | ORAL | Status: DC
  Administered 2017-09-11 – 2017-09-12 (×2): 10 mg via ORAL
  Filled 2017-09-11 (×2): qty 1

## 2017-09-11 MED ORDER — PREDNISONE 20 MG PO TABS: 40.0000 mg | ORAL_TABLET | Freq: Every day | ORAL | Status: DC

## 2017-09-11 MED ORDER — METFORMIN HCL 500 MG PO TABS: 500.0000 mg | ORAL_TABLET | Freq: Two times a day (BID) | ORAL | Status: DC

## 2017-09-11 MED ORDER — ENOXAPARIN SODIUM 40 MG/0.4ML ~~LOC~~ SOLN
40.0000 mg | Freq: Every day | SUBCUTANEOUS | Status: DC
  Administered 2017-09-11 – 2017-09-12 (×2): 40 mg via SUBCUTANEOUS
  Filled 2017-09-11 (×3): qty 0.4

## 2017-09-11 MED ORDER — LEVALBUTEROL HCL 1.25 MG/0.5ML IN NEBU
1.2500 mg | INHALATION_SOLUTION | Freq: Four times a day (QID) | RESPIRATORY_TRACT | Status: DC
  Administered 2017-09-11 (×2): 1.25 mg via RESPIRATORY_TRACT
  Filled 2017-09-11 (×4): qty 0.5

## 2017-09-11 MED ORDER — MOMETASONE FURO-FORMOTEROL FUM 100-5 MCG/ACT IN AERO
2.0000 | INHALATION_SPRAY | Freq: Two times a day (BID) | RESPIRATORY_TRACT | Status: DC
  Administered 2017-09-12: 2 via RESPIRATORY_TRACT
  Filled 2017-09-11 (×2): qty 8.8

## 2017-09-11 MED ORDER — ALBUTEROL SULFATE (2.5 MG/3ML) 0.083% IN NEBU
2.5000 mg | INHALATION_SOLUTION | RESPIRATORY_TRACT | Status: DC | PRN
  Filled 2017-09-11: qty 3

## 2017-09-11 MED ORDER — INSULIN ASPART 100 UNIT/ML ~~LOC~~ SOLN
0.0000 [IU] | Freq: Three times a day (TID) | SUBCUTANEOUS | Status: DC
  Administered 2017-09-11: 6 [IU] via SUBCUTANEOUS
  Administered 2017-09-11: 8 [IU] via SUBCUTANEOUS
  Administered 2017-09-12: 2 [IU] via SUBCUTANEOUS
  Filled 2017-09-11 (×2): qty 1

## 2017-09-11 MED ORDER — PRAVASTATIN SODIUM 20 MG PO TABS
20.0000 mg | ORAL_TABLET | Freq: Every day | ORAL | Status: DC
  Administered 2017-09-11: 20 mg via ORAL
  Filled 2017-09-11: qty 1

## 2017-09-11 MED ORDER — AZITHROMYCIN 250 MG PO TABS
250.0000 mg | ORAL_TABLET | Freq: Every day | ORAL | Status: AC
  Administered 2017-09-11: 250 mg via ORAL
  Filled 2017-09-11: qty 1

## 2017-09-11 NOTE — ED Notes (Signed)
Pt's breakfast tray arrived 

## 2017-09-11 NOTE — ED Notes (Signed)
Attempted report 

## 2017-09-11 NOTE — ED Notes (Signed)
MD at bedside. 

## 2017-09-11 NOTE — Progress Notes (Signed)
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  In the interim he has endorsed suicidal ideation with a plan to overdose on his mother's cardiac medications.   Assessment & Plan:   Principal Problem:   Acute on chronic respiratory failure with hypoxia (HCC) Active Problems:   Bipolar 1 disorder (HCC)   COPD with acute exacerbation (HCC)   Prostate cancer ([place])   #) Shortness of breath/COPD exacerbation: Patient has had multiple multiple admissions for COPD exacerbation.  He has had for the last month and over 10 in the last 3 months.  Unfortunately he continues to actively smoke. -Continue levalbuterol every 6 hours -PRN short-acting bronchodilator -Continue L AMA/ICS/LABA - Continue prednisone 50 mg daily - PRN alprazolam for anxiety to see if this is playing any component -Consider starting low-dose opiate for dyspnea -Continue azithromycin to complete a course  #) Suicidal ideation: On further discussion with the patient he reports that he is soon going to be kicked out of his house.  Along with multiple admissions for shortness of breath he reports that this makes him feel hopeless and suicidal.  He reports he would shoot himself with a gun if he had or he would overdose on his mother's pain medications.  He denies any prior attempts at suicidal attempts. - IVC Advertising account executive -Psychiatry consult  #) hyperlipidemia: -Continue pravastatin 20 mg nightly  #) Type 2 diabetes: -Hold home metformin 500 mg twice daily -Sliding scale insulin, AC  #) Psych: -Continue escitalopram 10 mg daily  Fluids: Tolerating p.o. Electrodes:  Monitor and supplement Nutrition: Carb restricted diet  Prophylaxis: Enoxaparin  Disposition: Pending clearance by psychiatry and improved dyspnea  Full code  Consultants:   Psychiatry:  Procedures:   None  Antimicrobials:   Zithromax    Subjective: Patient reports he is not currently short of breath but does have significant shortness of breath on exertion.  He reports that because of his current social situation with soon being homeless that he feels like committing suicide.  He reports that he would take a gun and shoot himself in the head if he had one.  He denies any guns in the house.  He reports that the other way he would kill himself would be to overdose on his mother's medication.  He reports that he feels hopeless.  Objective: Vitals:   09/11/17 1000 09/11/17 1015 09/11/17 1030 09/11/17 1045  BP: 105/77 112/72 125/82 126/84  Pulse: 90 (!) 105 94 84  Resp: (!) 25 (!) 26 20 (!) 26  Temp:      TempSrc:      SpO2: 97% 97% 98% 97%    Intake/Output Summary (Last 24 hours) at 09/11/2017 1239 Last data filed at 09/11/2017 1015 Gross per 24 hour  Intake -  Output 575 ml  Net -575 ml   There were no vitals filed for this visit.  Examination:  General exam: No acute distress Respiratory system: Mild increased work of breathing, hyperinflated, prolonged expiratory phase, scattered wheezes, no rhonchi or rales Cardiovascular system: Heart sounds, regular rate and rhythm, no murmurs Gastrointestinal system: Abdomen is nondistended, soft and nontender. No organomegaly or masses felt. Normal bowel sounds heard.  Central nervous system: Alert and oriented. No focal neurological deficits. Extremities: Lower extremity edema Skin: No rashes visible skin Psychiatry: Judgement and insight appear poor l. Mood & affect depressed    Data Reviewed: I have personally reviewed following labs and imaging studies  CBC: Recent Labs  Lab 09/04/17 2028 09/06/17 0448  09/07/17 0736 09/10/17 2214  WBC 14.1* 18.0* 18.3* 14.5*  NEUTROABS  --   --   --  10.7*  HGB 14.6 12.6* 14.3 14.8  HCT 46.6 38.9* 45.1 47.0  MCV 97.5 93.1 94.0 97.1  PLT 208 190 225 315   Basic Metabolic Panel: Recent Labs  Lab 09/04/17 2028 09/05/17 0358 09/06/17 0448 09/07/17 0736 09/10/17 2214  NA 140 138 137 139 141  K 4.1 5.4* 4.4 4.6 4.2  CL 99 97* 96* 96* 93*  CO2 32 35* 35* 36* 40*  GLUCOSE 134* 263* 289* 202* 241*  BUN 22 24* 21 16 13   CREATININE 0.99 0.97 0.74 0.84 0.87  CALCIUM 8.2* 8.2* 8.7* 9.0 8.8*   GFR: Estimated Creatinine Clearance: 88.4 mL/min (by C-G formula based on SCr of 0.87 mg/dL). Liver Function Tests: Recent Labs  Lab 09/06/17 0448 09/10/17 2214  AST 13* 18  ALT 23 24  ALKPHOS 48 61  BILITOT 0.6 0.6  PROT 5.2* 6.0*  ALBUMIN 3.1* 3.9   No results for input(s): LIPASE, AMYLASE in the last 168 hours. No results for input(s): AMMONIA in the last 168 hours. Coagulation Profile: No results for input(s): INR, PROTIME in the last 168 hours. Cardiac Enzymes: Recent Labs  Lab 09/04/17 2028 09/10/17 2214  TROPONINI <0.03 <0.03   BNP (last 3 results) No results for input(s): PROBNP in the last 8760 hours. HbA1C: No results for input(s): HGBA1C in the last 72 hours. CBG: Recent Labs  Lab 09/07/17 1547 09/07/17 1618 09/11/17 0439 09/11/17 0734 09/11/17 1159  GLUCAP 238* 240* 328* 263* 263*   Lipid Profile: No results for input(s): CHOL, HDL, LDLCALC, TRIG, CHOLHDL, LDLDIRECT in the last 72 hours. Thyroid Function Tests: No results for input(s): TSH, T4TOTAL, FREET4, T3FREE, THYROIDAB in the last 72 hours. Anemia Panel: No results for input(s): VITAMINB12, FOLATE, FERRITIN, TIBC, IRON, RETICCTPCT in the last 72 hours. Sepsis Labs: No results for input(s): PROCALCITON, LATICACIDVEN in the last 168 hours.  Recent Results (from the past 240 hour(s))  MRSA PCR Screening     Status: None   Collection Time: 09/05/17  6:50 AM  Result  Value Ref Range Status   MRSA by PCR NEGATIVE NEGATIVE Final    Comment:        The GeneXpert MRSA Assay (FDA approved for NASAL specimens only), is one component of a comprehensive MRSA colonization surveillance program. It is not intended to diagnose MRSA infection nor to guide or monitor treatment for MRSA infections. Performed at [facility], [place] [address] [place],  [address]          Radiology Studies: Dg Chest Portable 1 View  Result Date: 09/10/2017 CLINICAL DATA:  Shortness of breath. EXAM: PORTABLE CHEST 1 VIEW COMPARISON:  Multiple prior exams. Most recent radiograph 09/04/2017. Most recent CT 08/24/2017 FINDINGS: Chronic hyperinflation with emphysema. Normal heart size and mediastinal contours. No focal airspace opacity, pleural effusion, pneumothorax or pulmonary edema. Remote right rib fractures again seen. IMPRESSION: 1. No acute findings. 2.  Emphysema (ICD10-J43.9). Electronically Signed   By: Jeb Levering M.D.   On: 09/10/2017 22:40        Scheduled Meds: . enoxaparin (  LOVENOX) injection  40 mg Subcutaneous Daily  . escitalopram  10 mg Oral Daily  . insulin aspart  0-15 Units Subcutaneous TID WC  . levalbuterol  1.25 mg Nebulization Q6H  . mometasone-formoterol  2 puff Inhalation BID  . pravastatin  20 mg Oral q1800  . predniSONE  50 mg Oral Q breakfast  . tiotropium  18 mcg Inhalation Daily   Continuous Infusions:   LOS: 0 days    Time spent: [place], MD [facility]  If 7PM-7AM, please contact night-coverage [email or web address] Password Edward [place] 09/11/2017, 12:39 PM

## 2017-09-11 NOTE — ED Notes (Signed)
MD aware came and seen patient, no recommendations at this time.

## 2017-09-11 NOTE — ED Notes (Addendum)
Admitting MD paged,   Koleen Nimrod F03 HR now 130-140's patient has no complaints at this time.

## 2017-09-11 NOTE — ED Notes (Signed)
Taken off bipap, placed on 3L

## 2017-09-11 NOTE — Consult Note (Addendum)
[place] Psychiatry Consult   Reason for Consult:  SI Referring Physician:  Dr. Herbert Moors Patient Identification: Taylor [name] MRN:  [id number] Principal Diagnosis: Anxiety and depression Diagnosis:   Patient Active Problem List   Diagnosis Date Noted  . Acute on chronic respiratory failure with hypoxia ([place]) [J96.21] 09/11/2017  . Prostate cancer ([place]) [C61] 09/04/2017  . Acute respiratory failure with hypercapnia ([place]) [J96.02]   . SOB (shortness of breath) [R06.02]   . Malnutrition of moderate degree [E44.0] 06/14/2017  . Hyperglycemia [R73.9] 06/08/2017  . Constipation [K59.00] 06/08/2017  . SIRS (systemic inflammatory response syndrome) (HCC) [R65.10] 05/21/2017  . Tobacco abuse [Z72.0] 05/13/2017  . HLD (hyperlipidemia) [E78.5] 05/13/2017  . Leukocytosis [D72.829] 04/06/2017  . Adjustment disorder with depressed mood [F43.21] 03/28/2017  . Adjustment disorder with mixed disturbance of emotions and conduct [F43.25]   . Acute on chronic respiratory failure with hypercapnia ([place]) [J96.22] 03/24/2017  . Normocytic normochromic anemia [D64.9] 03/24/2017  . COPD with acute exacerbation ([place]) [J44.1] 10/22/2016  . Alcohol abuse [F10.10] 01/09/2013  . COPD exacerbation ([place]) [J44.1] 01/09/2013  . Smoker [F17.200] 12/16/2012  . Inguinal hernia unilateral, non-recurrent, right [K40.90] 05/01/2012  . Dyspepsia [R10.13] 02/05/2012  . Bipolar 1 disorder ([place]) [F31.9] 02/05/2012  . 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 COPD  GOLD III [J44.9] 02/03/2012  . Thrombocytopenia ([place]) [D69.6] 02/03/2012  . Depression [F32.9]   . 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 male patient admitted with acute on chronic respiratory failure with hypoxia.  HPI:   Per chart review, patient was admitted with acute on chronic respiratory failure with hypoxia  secondary to COPD exacerbation. He has a history of bipolar disorder. Home medications include Abilify 5 mg daily and Lexapro 10 mg daily. Of note, he was last seen at [facility] in 03/2017. He endorsed SI in the setting of COPD issues and homelessness. He later denied SI and was psychiatrically cleared.   On interview, Taylor [name] reports that he endorsed SI earlier because he was feeling ill.  He reports that he is feeling much better.  He denies problems with dyspnea at this time.  He reports wanting to discharge from the hospital because he has to move out of his boarding house because it is no longer available for renting.  He plans to move in with a friend or temporarily live in a hotel.  He reports that his mood has been stable.  He denies problems with sleep or appetite.  He reports compliance with Abilify and Lexapro.  He has not been to follow up at [facility] since he was released from jail 2 years ago.  He has been getting his medications from his hospital admissions.  He denies SI, HI or AVH.  He denies a history of manic symptoms (decreased need for sleep, increased energy, pressured speech or euphoria).  Past Psychiatric History: MDD and BPAD.   Risk to Self: None. Denies SI.  Risk to Others:  None. Denies HI.  Prior Inpatient Therapy:  Denies  Prior Outpatient Therapy:  He was previously followed at [facility].  Past Medical History:  Past Medical History:  Diagnosis Date  . Asthma   . Bipolar 1 disorder ([place]) 02/05/2012  . Colon cancer ([place]) 04/11/11   adenocarcinoma of colon, 7/19 nodes pos.FINISHED CHEMO/DR. SHERRILL  . COPD (chronic obstructive pulmonary disease) ([facility])    SMOKER  .  Depression   . Emphysema of lung ([place])   . Full dentures   . Hemorrhoids   . On home oxygen therapy    "3L; 24/7" (05/29/2017)  . Pneumonia ~ 2016   "double pneumonia"  . Prostate cancer ([place])    Gleason score = 7, supposed to have radiation therapy but he has not followed up (05/29/2017)  . Rib fractures     hx of    Past Surgical History:  Procedure Laterality Date  . COLON SURGERY  04/11/11   Sigmoid colectomy  . COLOSTOMY REVISION  04/11/2011   Procedure: COLON RESECTION SIGMOID;  Surgeon: [name], MD;  Location: [facility];  Service: General;  Laterality: N/A;  low anterior colon resection   . HERNIA REPAIR    . INGUINAL HERNIA REPAIR Right 05/01/2012   Procedure: HERNIA REPAIR INGUINAL ADULT;  Surgeon: [name], MD;  Location: [facility];  Service: General;  Laterality: Right;  . INSERTION OF MESH Right 05/01/2012   Procedure: INSERTION OF MESH;  Surgeon: [name], MD;  Location: [facility];  Service: General;  Laterality: Right;  . PORT-A-CATH REMOVAL Left 05/01/2012   Procedure: REMOVAL Infusion Port;  Surgeon: [name], MD;  Location: [facility];  Service: General;  Laterality: Left;  . PORTACATH PLACEMENT  05/02/2011   Procedure: INSERTION PORT-A-CATH;  Surgeon: [name], MD;  Location: [facility];  Service: General;  Laterality: N/A;  . PROSTATE BIOPSY     Family History:  Family History  Problem Relation Age of Onset  . Heart disease Father   . Lung cancer Maternal Uncle        smoked   Family Psychiatric  History: Brother-depression Social History:  Social History   Substance and Sexual Activity  Alcohol Use Not Currently   Comment: h/o use in the past, no h/o heavy use     Social History   Substance and Sexual Activity  Drug Use No    Social History   Socioeconomic History  . Marital status: Divorced    Spouse name: Not on file  . Number of children: Not on file  . Years of education: Not on file  . Highest education level: Not on file  Occupational History  . Occupation: disability pending  Social Needs  . Financial resource strain: Not on file  . Food insecurity:    Worry: Not on file    Inability: Not on file  . Transportation needs:    Medical: Not on file    Non-medical: Not on file  Tobacco Use  . Smoking status: Former Smoker     Packs/day: 0.10    Years: 46.00    Pack years: 4.60    Types: Cigarettes    Last attempt to quit: 05/21/2017    Years since quitting: 0.3  . Smokeless tobacco: Former Systems developer    Types: Chew  Substance and Sexual Activity  . Alcohol use: Not Currently    Comment: h/o use in the past, no h/o heavy use  . Drug use: No  . Sexual activity: Not Currently  Lifestyle  . Physical activity:    Days per week: Not on file    Minutes per session: Not on file  . Stress: Not on file  Relationships  . Social connections:    Talks on phone: Not on file    Gets together: Not on file    Attends religious service: Not on file    Active member of club or  organization: Not on file    Attends meetings of clubs or organizations: Not on file    Relationship status: Not on file  Other Topics Concern  . Not on file  Social History Narrative   Lives alone-divorced, disabled, in [place] 1970's   Brother, Aneudy Champlain and his wife Jackelyn Poling assist   Prior occupation: Public relations account executive Social History: He lives in a boarding house. He denies stable housing for 4 years since release from jail. He was incarcerated for 2.5 years. He is a sex offender. He is divorced. He has a 39 y/o son son for which he is estranged. He has been unemployed for the past 10 years. He used to do Dealer work. He denies alcohol or illicit substance use. He reports heavy alcohol use 4 years ago but denies a history of withdrawal. He previously smoked 1 ppd for several years.     Allergies:   Allergies  Allergen Reactions  . Codeine Nausea And Vomiting    Labs:  Results for orders placed or performed during the hospital encounter of 09/10/17 (from the past 48 hour(s))  CBC with Differential     Status: Abnormal   Collection Time: 09/10/17 10:14 PM  Result Value Ref Range   WBC 14.5 (H) 4.0 - 10.5 K/uL   RBC 4.84 4.22 - 5.81 MIL/uL   Hemoglobin 14.8 13.0 - 17.0 g/dL   HCT 47.0 39.0 - 52.0 %   MCV 97.1 78.0 - 100.0 fL   MCH  30.6 26.0 - 34.0 pg   MCHC 31.5 30.0 - 36.0 g/dL   RDW 12.4 11.5 - 15.5 %   Platelets 240 150 - 400 K/uL   Neutrophils Relative % 73 %   Neutro Abs 10.7 (H) 1.7 - 7.7 K/uL   Lymphocytes Relative 14 %   Lymphs Abs 2.0 0.7 - 4.0 K/uL   Monocytes Relative 7 %   Monocytes Absolute 1.0 0.1 - 1.0 K/uL   Eosinophils Relative 1 %   Eosinophils Absolute 0.2 0.0 - 0.7 K/uL   Basophils Relative 1 %   Basophils Absolute 0.1 0.0 - 0.1 K/uL   Immature Granulocytes 4 %   Abs Immature Granulocytes 0.5 (H) 0.0 - 0.1 K/uL    Comment: Performed at [facility], [address], [place], [place] [address]  Basic metabolic panel     Status: Abnormal   Collection Time: 09/10/17 10:14 PM  Result Value Ref Range   Sodium 141 135 - 145 mmol/L   Potassium 4.2 3.5 - 5.1 mmol/L   Chloride 93 (L) 98 - 111 mmol/L   CO2 40 (H) 22 - 32 mmol/L   Glucose, Bld 241 (H) 70 - 99 mg/dL   BUN 13 8 - 23 mg/dL   Creatinine, Ser 0.87 0.61 - 1.24 mg/dL   Calcium 8.8 (L) 8.9 - 10.3 mg/dL   GFR calc non Af Amer >60 >60 mL/min   GFR calc Af Amer >60 >60 mL/min    Comment: (NOTE) The eGFR has been calculated using the CKD EPI equation. This calculation has not been validated in all clinical situations. eGFR's persistently <60 mL/min signify possible Chronic Kidney Disease.    Anion gap 8 5 - 15    Comment: Performed at [facility] [address] [place], [place] [address]  Hepatic function panel     Status: Abnormal   Collection Time: 09/10/17 10:14 PM  Result Value Ref Range   Total Protein 6.0 (L) 6.5 - 8.1  g/dL   Albumin 3.9 3.5 - 5.0 g/dL   AST 18 15 - 41 U/L   ALT 24 0 - 44 U/L   Alkaline Phosphatase 61 38 - 126 U/L   Total Bilirubin 0.6 0.3 - 1.2 mg/dL   Bilirubin, Direct 0.1 0.0 - 0.2 mg/dL   Indirect Bilirubin 0.5 0.3 - 0.9 mg/dL    Comment: Performed at [facility] [address] [place], [place] [address]  Troponin I     Status: None   Collection Time: 09/10/17 10:14 PM  Result  Value Ref Range   Troponin I <0.03 <0.03 ng/mL    Comment: Performed at [facility] [address] [place], [place] [address]  Brain natriuretic peptide     Status: None   Collection Time: 09/10/17 10:15 PM  Result Value Ref Range   B Natriuretic Peptide 94.6 0.0 - 100.0 pg/mL    Comment: Performed at [facility] [address] [place], [place] [address]  CBG monitoring, ED     Status: Abnormal   Collection Time: 09/11/17  4:39 AM  Result Value Ref Range   Glucose-Capillary 328 (H) 70 - 99 mg/dL  CBG monitoring, ED     Status: Abnormal   Collection Time: 09/11/17  7:34 AM  Result Value Ref Range   Glucose-Capillary 263 (H) 70 - 99 mg/dL    Current Facility-Administered Medications  Medication Dose Route Frequency Provider Last Rate Last Dose  . albuterol (PROVENTIL) (2.5 MG/3ML) 0.083% nebulizer solution 2.5 mg  2.5 mg Nebulization Q2H PRN Etta Quill, DO      . ALPRAZolam Duanne Moron) tablet 0.5 mg  0.5 mg Oral TID PRN Purohit, Shrey C, MD      . enoxaparin (LOVENOX) injection 40 mg  40 mg Subcutaneous Daily Alcario Drought, Jared M, DO      . escitalopram (LEXAPRO) tablet 10 mg  10 mg Oral Daily Jennette Kettle M, DO   10 mg at 09/11/17 1023  . insulin aspart (novoLOG) injection 0-15 Units  0-15 Units Subcutaneous TID WC Etta Quill, DO   8 Units at 09/11/17 [phone]  . levalbuterol (XOPENEX) nebulizer solution 1.25 mg  1.25 mg Nebulization Q6H Purohit, Shrey C, MD   1.25 mg at 09/11/17 0841  . mometasone-formoterol (DULERA) 100-5 MCG/ACT inhaler 2 puff  2 puff Inhalation BID Etta Quill, DO      . pravastatin (PRAVACHOL) tablet 20 mg  20 mg Oral q1800 Etta Quill, DO      . predniSONE (DELTASONE) tablet 50 mg  50 mg Oral Q breakfast Jennette Kettle M, DO   50 mg at 09/11/17 0743  . tiotropium (SPIRIVA) inhalation capsule 18 mcg  18 mcg Inhalation Daily Etta Quill, DO       Current Outpatient Medications  Medication Sig Dispense Refill  . albuterol  (PROVENTIL HFA;VENTOLIN HFA) 108 (90 Base) MCG/ACT inhaler Inhale 2 puffs into the lungs every 6 (six) hours as needed for wheezing or shortness of breath. 1 Inhaler 3  . albuterol (PROVENTIL) (5 MG/ML) 0.5% nebulizer solution Take 1 mL (5 mg total) by nebulization every 6 (six) hours as needed for wheezing or shortness of breath. 30 vial 3  . ARIPiprazole (ABILIFY) 5 MG tablet Take 1 tablet (5 mg total) by mouth daily with breakfast. 30 tablet 3  . azithromycin (ZITHROMAX Z-PAK) 250 MG tablet Take 2 tablets (500 mg) on  Day 1,  followed by 1 tablet (250 mg) once daily  on Days 2 through 5. 6 each 0  . escitalopram (LEXAPRO) 10 MG tablet Take 1 tablet (10 mg total) by mouth daily. 30 tablet 3  . lovastatin (MEVACOR) 20 MG tablet Take 1 tablet (20 mg total) by mouth at bedtime. 30 tablet 12  . metFORMIN (GLUCOPHAGE) 500 MG tablet Take 1 tablet (500 mg total) by mouth 2 (two) times daily with a meal. 60 tablet 0  . mometasone-formoterol (DULERA) 100-5 MCG/ACT AERO Inhale 2 puffs into the lungs 2 (two) times daily. 1 Inhaler 12  . predniSONE (DELTASONE) 10 MG tablet Take 1 tablet (10 mg total) by mouth daily. 30 tablet 0  . tiotropium (SPIRIVA HANDIHALER) 18 MCG inhalation capsule Place 1 capsule (18 mcg total) into inhaler and inhale daily. 30 capsule 0  . blood glucose meter kit and supplies KIT Dispense based on patient and insurance preference. Use up to four times daily as directed. (FOR ICD-9 250.00, 250.01). 1 each 0  . OXYGEN Inhale 3 L into the lungs.       Musculoskeletal: Strength & Muscle Tone: within normal limits Gait & Station: unable to stand Patient leans: N/A  Psychiatric Specialty Exam: Physical Exam  Nursing note and vitals reviewed. Constitutional: He is oriented to person, place, and time. He appears well-developed and well-nourished.  HENT:  Head: Normocephalic and atraumatic.  Neck: Normal range of motion.  Respiratory: Effort normal.  Musculoskeletal: Normal range of  motion.  Neurological: He is alert and oriented to person, place, and time.  Skin: No rash noted.  Psychiatric: He has a normal mood and affect. His speech is normal and behavior is normal. Judgment and thought content normal. Cognition and memory are normal.    Review of Systems  Respiratory: Negative for shortness of breath.   Gastrointestinal: Negative for abdominal pain, constipation, diarrhea, nausea and vomiting.  Psychiatric/Behavioral: Negative for depression, hallucinations, substance abuse and suicidal ideas. The patient does not have insomnia.   All other systems reviewed and are negative.   Blood pressure 125/82, pulse 94, temperature 98.3 F (36.8 C), temperature source Oral, resp. rate 20, SpO2 98 %.There is no height or weight on file to calculate BMI.  General Appearance: Fairly Groomed, middle aged, Caucasian male, wearing a hospital gown with [place] in place and lying in bed. NAD.   Eye Contact:  Good  Speech:  Clear and Coherent and Normal Rate  Volume:  Normal  Mood:  "Better"  Affect:  Appropriate and Congruent  Thought Process:  Goal Directed, Linear and Descriptions of Associations: Intact  Orientation:  Full (Time, Place, and Person)  Thought Content:  Logical  Suicidal Thoughts:  No  Homicidal Thoughts:  No  Memory:  Immediate;   Good Recent;   Good Remote;   Good  Judgement:  Good  Insight:  Good  Psychomotor Activity:  Normal  Concentration:  Concentration: Good and Attention Span: Good  Recall:  Good  Fund of Knowledge:  Good  Language:  Good  Akathisia:  No  Handed:  Right  AIMS (if indicated):   N/A  Assets:  Communication Skills Desire for Improvement Housing Resilience Social Support  ADL's:  Intact  Cognition:  WNL  Sleep:   Okay   Assessment:  Taylor [name] is a 62 y.o. male who was admitted with acute on chronic respiratory failure with hypoxia. He endorsed SI in the setting of current illness. He reports feeling better and denies  current SI or any intention to harm self. He denies a  history of suicide attempts. He denies HI or AVH. He does not warrant inpatient psychiatric hospitalization and should follow up with his outpatient provider.   Treatment Plan Summary: -Continue Abilify 5 mg daily and Lexapro 10 mg daily for depression and anxiety.  -EKG reviewed and QTc 412 on 8/21. Please closely monitor when starting or increasing QTc prolonging agents.  -Psychiatry will sign off on patient at this time. Please consult psychiatry again as needed.  Disposition: No evidence of imminent risk to self or others at present.   Patient does not meet criteria for psychiatric inpatient admission.  Faythe Dingwall, DO 09/11/2017 10:56 AM

## 2017-09-11 NOTE — ED Notes (Signed)
PT at bedside.

## 2017-09-11 NOTE — Evaluation (Signed)
Physical Therapy Evaluation Patient Details Name: Taylor [name] MRN: [id number] DOB: June 27, 1955 Today's Date: 09/11/2017   History of Present Illness  Pt is a 62 y/o male admitted secondary to worsening SOB. Thought to be secondary to COPD exacerbation. Since admission, pt also expressing suicidal thoughts. PMH includes COPD on 3L of oxygen, bipolar disorder, prostate cancer, DM, and colon cancer.   Clinical Impression  Pt admitted secondary to problem above with deficits below. Pt able to perform bed mobility with supervision, however, upon sitting, pt with increased SOB. Oxygen sats WFL on 5L of oxygen, however, HR elevated to 135 bpm. Pt returned to supine and HR decreased to 80-90 bpm. Feel pt will progress well once breathing improved. Pt did stated "You don't want to know what I would do if I wasn't in here" implying suicidal ideations; RN notified. Will continue to follow acutely to maximize functional mobility independence and safety.     Follow Up Recommendations Other (comment)(TBD pending pt progress )    Equipment Recommendations  Other (comment)(rollator )    Recommendations for Other Services       Precautions / Restrictions Precautions Precautions: Fall;Other (comment) Precaution Comments: suicide precautions  Restrictions Weight Bearing Restrictions: No      Mobility  Bed Mobility Overal bed mobility: Needs Assistance Bed Mobility: Supine to Sit;Sit to Supine     Supine to sit: Supervision Sit to supine: Supervision   General bed mobility comments: Supervision for safety. Increased work of breathing noted upon sitting and educated about pursed lip breathing. Oxygen sats WFL on 5L of oxygen. HR elevated to 135 bpm in sitting and then returned to 80s-90s upon return to supine.   Transfers                 General transfer comment: Deferred secondary to increased SOB and elevated HR.   Ambulation/Gait                Stairs             Wheelchair Mobility    Modified Rankin (Stroke Patients Only)       Balance Overall balance assessment: Needs assistance Sitting-balance support: Feet supported Sitting balance-Leahy Scale: Good                                       Pertinent Vitals/Pain Pain Assessment: No/denies pain    Home Living Family/patient expects to be discharged to:: Unsure Living Arrangements: Other (Comment)(Boarding house )               Additional Comments: Reports he will be moving out of boarding house on Sunday or Monday.     Prior Function Level of Independence: Independent               Hand Dominance        Extremity/Trunk Assessment   Upper Extremity Assessment Upper Extremity Assessment: Defer to OT evaluation    Lower Extremity Assessment Lower Extremity Assessment: Generalized weakness    Cervical / Trunk Assessment Cervical / Trunk Assessment: Other exceptions Cervical / Trunk Exceptions: rounded shoulders and forward head.   Communication   Communication: No difficulties  Cognition Arousal/Alertness: Awake/alert Behavior During Therapy: Anxious Overall Cognitive Status: No family/caregiver present to determine baseline cognitive functioning  General Comments: Pt very anxious and reports he is dying multiple times. Stated at one point "you don't want to know what I'd do if I wasn't here" implying suicidal thoughts. Notified RN.       General Comments      Exercises General Exercises - Lower Extremity Heel Raises: AROM;Both;10 reps;Seated   Assessment/Plan    PT Assessment Patient needs continued PT services  PT Problem List Cardiopulmonary status limiting activity;Decreased activity tolerance;Decreased mobility;Decreased strength;Decreased knowledge of precautions       PT Treatment Interventions DME instruction;Gait training;Functional mobility training;Therapeutic  activities;Balance training;Therapeutic exercise;Patient/family education    PT Goals (Current goals can be found in the Care Plan section)  Acute Rehab PT Goals Patient Stated Goal: none stated  PT Goal Formulation: With patient Time For Goal Achievement: 09/25/17 Potential to Achieve Goals: Fair    Frequency Min 3X/week   Barriers to discharge Decreased caregiver support Unsure of where he will be discharging     Co-evaluation               AM-PAC PT "6 Clicks" Daily Activity  Outcome Measure Difficulty turning over in bed (including adjusting bedclothes, sheets and blankets)?: None Difficulty moving from lying on back to sitting on the side of the bed? : A Little Difficulty sitting down on and standing up from a chair with arms (e.g., wheelchair, bedside commode, etc,.)?: Unable Help needed moving to and from a bed to chair (including a wheelchair)?: A Little Help needed walking in hospital room?: A Little Help needed climbing 3-5 steps with a railing? : A Lot 6 Click Score: 16    End of Session Equipment Utilized During Treatment: Oxygen Activity Tolerance: Treatment limited secondary to medical complications (Comment)(SOB, elevated HR ) Patient left: in bed;with call bell/phone within reach Nurse Communication: Mobility status PT Visit Diagnosis: Other abnormalities of gait and mobility (R26.89)    Time: 1450-1500 PT Time Calculation (min) (ACUTE ONLY): 10 min   Charges:   PT Evaluation $PT Eval Moderate Complexity: 1 Mod          Taylor [name], PT, DPT  Acute Rehabilitation Services  Pager: [phone]   Taylor [name] 09/11/2017, 4:59 PM

## 2017-09-11 NOTE — H&P (Signed)
History and Physical    Taylor [name] VFI:[id number] DOB: 29-Jun-1955 DOA: 09/10/2017  PCP: [facility]  Patient coming from: [facility]  I have personally briefly reviewed patient's old medical records in [place]  Chief Complaint: Respiratory distress  HPI: Taylor [name] is a 62 y.o. male with medical history significant of bipolar disorder, COPD with chronic 3 L/min supplemental oxygen requirement, and prostate cancer referred to urology last week.  Patient has been having a rough time with COPD recently having had 3 admissions to hospital for COPD exacerbations so far this month.  Most recently he was admitted from 8/15 to 8/18.  Since discharge he has had progressively worsening SOB for the past several days with increased cough.  Presents to ED today in respiratory distress.   ED Course: Given neb treatments, solumedrol, put on BIPAP.  Symptoms improved somewhat since interventions and patient was able to come off of the BIPAP for now.   Review of Systems: As per HPI otherwise 10 point review of systems negative.   Past Medical History:  Diagnosis Date  . Asthma   . Bipolar 1 disorder ([place]) 02/05/2012  . Colon cancer ([place]) 04/11/11   adenocarcinoma of colon, 7/19 nodes pos.FINISHED CHEMO/DR. SHERRILL  . COPD (chronic obstructive pulmonary disease) ([place])    SMOKER  . Depression   . Emphysema of lung ([place])   . Full dentures   . Hemorrhoids   . On home oxygen therapy    "3L; 24/7" (05/29/2017)  . Pneumonia ~ 2016   "double pneumonia"  . Prostate cancer ([place])    Gleason score = 7, supposed to have radiation therapy but he has not followed up (05/29/2017)  . Rib fractures    hx of    Past Surgical History:  Procedure Laterality Date  . COLON SURGERY  04/11/11   Sigmoid colectomy  . COLOSTOMY REVISION  04/11/2011   Procedure: COLON RESECTION SIGMOID;  Surgeon: [name], MD;  Location: [facility];  Service: General;  Laterality: N/A;  low  anterior colon resection   . HERNIA REPAIR    . INGUINAL HERNIA REPAIR Right 05/01/2012   Procedure: HERNIA REPAIR INGUINAL ADULT;  Surgeon: [name], MD;  Location: [facility];  Service: General;  Laterality: Right;  . INSERTION OF MESH Right 05/01/2012   Procedure: INSERTION OF MESH;  Surgeon: [name], MD;  Location: [facility];  Service: General;  Laterality: Right;  . PORT-A-CATH REMOVAL Left 05/01/2012   Procedure: REMOVAL Infusion Port;  Surgeon: [name], MD;  Location: [facility];  Service: General;  Laterality: Left;  . PORTACATH PLACEMENT  05/02/2011   Procedure: INSERTION PORT-A-CATH;  Surgeon: [name], MD;  Location: [facility];  Service: General;  Laterality: N/A;  . PROSTATE BIOPSY       reports that he quit smoking about 3 months ago. His smoking use included cigarettes. He has a 4.60 pack-year smoking history. He has quit using smokeless tobacco.  His smokeless tobacco use included chew. He reports that he drank alcohol. He reports that he does not use drugs.  Allergies  Allergen Reactions  . Codeine Nausea And Vomiting    Family History  Problem Relation Age of Onset  . Heart disease Father   . Lung cancer Maternal Uncle        smoked     Prior to Admission medications   Medication Sig Start Date End Date Taking? Authorizing Provider  albuterol (PROVENTIL HFA;VENTOLIN  HFA) 108 (90 Base) MCG/ACT inhaler Inhale 2 puffs into the lungs every 6 (six) hours as needed for wheezing or shortness of breath. 08/26/17  Yes Isabelle Course, MD  albuterol (PROVENTIL) (5 MG/ML) 0.5% nebulizer solution Take 1 mL (5 mg total) by nebulization every 6 (six) hours as needed for wheezing or shortness of breath. 09/07/17  Yes Danford, Suann Larry, MD  ARIPiprazole (ABILIFY) 5 MG tablet Take 1 tablet (5 mg total) by mouth daily with breakfast. 08/27/17  Yes Isabelle Course, MD  azithromycin (ZITHROMAX Z-PAK) 250 MG tablet Take 2 tablets (500 mg) on  Day 1,  followed by 1 tablet (250 mg) once  daily on Days 2 through 5. 09/07/17 09/12/17 Yes Danford, Suann Larry, MD  escitalopram (LEXAPRO) 10 MG tablet Take 1 tablet (10 mg total) by mouth daily. 08/27/17  Yes Isabelle Course, MD  lovastatin (MEVACOR) 20 MG tablet Take 1 tablet (20 mg total) by mouth at bedtime. 05/18/17  Yes Nita Sells, MD  metFORMIN (GLUCOPHAGE) 500 MG tablet Take 1 tablet (500 mg total) by mouth 2 (two) times daily with a meal. 09/07/17  Yes Danford, Suann Larry, MD  mometasone-formoterol (DULERA) 100-5 MCG/ACT AERO Inhale 2 puffs into the lungs 2 (two) times daily. 05/18/17  Yes Nita Sells, MD  predniSONE (DELTASONE) 10 MG tablet Take 1 tablet (10 mg total) by mouth daily. 09/07/17  Yes Danford, Suann Larry, MD  tiotropium (SPIRIVA HANDIHALER) 18 MCG inhalation capsule Place 1 capsule (18 mcg total) into inhaler and inhale daily. 08/29/17  Yes Mesner, Corene Cornea, MD  blood glucose meter kit and supplies KIT Dispense based on patient and insurance preference. Use up to four times daily as directed. (FOR ICD-9 250.00, 250.01). 09/07/17   Danford, Suann Larry, MD  OXYGEN Inhale 3 L into the lungs.     [provider]    Physical Exam: Vitals:   09/10/17 2330 09/11/17 0000 09/11/17 0015 09/11/17 0030  BP: 124/79 104/74 103/69 113/68  Pulse: (!) 141 84 79 80  Resp: (!) 23 (!) 32 (!) 25 (!) 32  Temp:      TempSrc:      SpO2: 98% 96% 96% 97%    Constitutional: NAD, calm, comfortable Eyes: PERRL, lids and conjunctivae normal ENMT: Mucous membranes are moist. Posterior pharynx clear of any exudate or lesions.Normal dentition.  Neck: normal, supple, no masses, no thyromegaly Respiratory: Diminished especially on L side.  Tachypnea of about 30, no accessory muscle use though, not in respiratory distress and talking full sentences now. Cardiovascular: Regular rate and rhythm, no murmurs / rubs / gallops. No extremity edema. 2+ pedal pulses. No carotid bruits.  Abdomen: no tenderness, no masses  palpated. No hepatosplenomegaly. Bowel sounds positive.  Musculoskeletal: no clubbing / cyanosis. No joint deformity upper and lower extremities. Good ROM, no contractures. Normal muscle tone.  Skin: no rashes, lesions, ulcers. No induration Neurologic: CN 2-12 grossly intact. Sensation intact, DTR normal. Strength 5/5 in all 4.  Psychiatric: Normal judgment and insight. Alert and oriented x 3. Normal mood.    Labs on Admission: I have personally reviewed following labs and imaging studies  CBC: Recent Labs  Lab 09/04/17 2028 09/06/17 0448 09/07/17 0736 09/10/17 2214  WBC 14.1* 18.0* 18.3* 14.5*  NEUTROABS  --   --   --  10.7*  HGB 14.6 12.6* 14.3 14.8  HCT 46.6 38.9* 45.1 47.0  MCV 97.5 93.1 94.0 97.1  PLT 208 190 225 454   Basic Metabolic Panel: Recent  Labs  Lab 09/04/17 2028 09/05/17 0358 09/06/17 0448 09/07/17 0736 09/10/17 2214  NA 140 138 137 139 141  K 4.1 5.4* 4.4 4.6 4.2  CL 99 97* 96* 96* 93*  CO2 32 35* 35* 36* 40*  GLUCOSE 134* 263* 289* 202* 241*  BUN 22 24* '21 16 13  ' CREATININE 0.99 0.97 0.74 0.84 0.87  CALCIUM 8.2* 8.2* 8.7* 9.0 8.8*   GFR: Estimated Creatinine Clearance: 88.4 mL/min (by C-G formula based on SCr of 0.87 mg/dL). Liver Function Tests: Recent Labs  Lab 09/06/17 0448 09/10/17 2214  AST 13* 18  ALT 23 24  ALKPHOS 48 61  BILITOT 0.6 0.6  PROT 5.2* 6.0*  ALBUMIN 3.1* 3.9   No results for input(s): LIPASE, AMYLASE in the last 168 hours. No results for input(s): AMMONIA in the last 168 hours. Coagulation Profile: No results for input(s): INR, PROTIME in the last 168 hours. Cardiac Enzymes: Recent Labs  Lab 09/04/17 2028 09/10/17 2214  TROPONINI <0.03 <0.03   BNP (last 3 results) No results for input(s): PROBNP in the last 8760 hours. HbA1C: No results for input(s): HGBA1C in the last 72 hours. CBG: Recent Labs  Lab 09/06/17 1644 09/06/17 2154 09/07/17 0801 09/07/17 1547 09/07/17 1618  GLUCAP 367* 258* 220* 238* 240*     Lipid Profile: No results for input(s): CHOL, HDL, LDLCALC, TRIG, CHOLHDL, LDLDIRECT in the last 72 hours. Thyroid Function Tests: No results for input(s): TSH, T4TOTAL, FREET4, T3FREE, THYROIDAB in the last 72 hours. Anemia Panel: No results for input(s): VITAMINB12, FOLATE, FERRITIN, TIBC, IRON, RETICCTPCT in the last 72 hours. Urine analysis:    Component Value Date/Time   COLORURINE YELLOW 06/13/2017 0203   APPEARANCEUR CLEAR 06/13/2017 0203   LABSPEC 1.018 06/13/2017 0203   PHURINE 6.0 06/13/2017 0203   GLUCOSEU 50 (A) 06/13/2017 0203   HGBUR NEGATIVE 06/13/2017 0203   BILIRUBINUR NEGATIVE 06/13/2017 0203   KETONESUR NEGATIVE 06/13/2017 0203   PROTEINUR NEGATIVE 06/13/2017 0203   UROBILINOGEN 1.0 01/09/2013 1843   NITRITE NEGATIVE 06/13/2017 0203   LEUKOCYTESUR NEGATIVE 06/13/2017 0203    Radiological Exams on Admission: Dg Chest Portable 1 View  Result Date: 09/10/2017 CLINICAL DATA:  Shortness of breath. EXAM: PORTABLE CHEST 1 VIEW COMPARISON:  Multiple prior exams. Most recent radiograph 09/04/2017. Most recent CT 08/24/2017 FINDINGS: Chronic hyperinflation with emphysema. Normal heart size and mediastinal contours. No focal airspace opacity, pleural effusion, pneumothorax or pulmonary edema. Remote right rib fractures again seen. IMPRESSION: 1. No acute findings. 2.  Emphysema (ICD10-J43.9). Electronically Signed   By: Jeb Levering M.D.   On: 09/10/2017 22:40    EKG: Independently reviewed.  Assessment/Plan Principal Problem:   Acute on chronic respiratory failure with hypoxia (HCC) Active Problems:   Bipolar 1 disorder ([facility])   COPD with acute exacerbation ([facility])   Prostate cancer ([place])    1. COPD exacerbation causing resp failure - 1. COPD pathway 2. Prednisone 42m PO daily (probably needs prolonged taper I think). 3. Will finish the last day of the Z pak 4. Continue home Dulera, spireva 5. PRN albuterol 6. Adult wheeze protocol 7. Tele monitor and  continuous pulse ox 2. BPD 1 - Cont home meds 3. Prostate cancer - follow up with urology as outpt  DVT prophylaxis: Lovenox Code Status: Full Family Communication: No family in room Disposition Plan: Home after admit Consults called: None Admission status: Admit to inpatient   GEtta QuillDO [facility] Pager [phone] works nights!  If 7AM-7PM, please  contact the primary day team physician taking care of patient  [email or web address] Password [facility]  09/11/2017, 12:46 AM

## 2017-09-11 NOTE — ED Notes (Signed)
MD advised patient express he was SI wanted to OD on mom Meds.

## 2017-09-11 NOTE — Progress Notes (Signed)
Received report from [place], [place] on 4N for transfer of pt to [phone].

## 2017-09-11 NOTE — ED Notes (Signed)
Patient states he wants to go home, reports he has to be out his house by Monday and there is no need to sit in this bed. RN spoke with admitting MD. Taylor [name] will come see patient.

## 2017-09-12 DIAGNOSIS — F419 Anxiety disorder, unspecified: Secondary | ICD-10-CM

## 2017-09-12 DIAGNOSIS — F329 Major depressive disorder, single episode, unspecified: Secondary | ICD-10-CM

## 2017-09-12 LAB — GLUCOSE, CAPILLARY
GLUCOSE-CAPILLARY: 138 mg/dL — AB (ref 70–99)
GLUCOSE-CAPILLARY: 199 mg/dL — AB (ref 70–99)

## 2017-09-12 LAB — HIV ANTIBODY (ROUTINE TESTING W REFLEX): HIV Screen 4th Generation wRfx: NONREACTIVE

## 2017-09-12 MED ORDER — ALPRAZOLAM 0.5 MG PO TABS: 0.5000 mg | ORAL_TABLET | Freq: Three times a day (TID) | ORAL | 0 refills | Status: DC | PRN

## 2017-09-12 MED ORDER — ACETAMINOPHEN 325 MG PO TABS
650.0000 mg | ORAL_TABLET | Freq: Four times a day (QID) | ORAL | Status: DC | PRN
  Administered 2017-09-12: 650 mg via ORAL
  Filled 2017-09-12: qty 2

## 2017-09-12 MED ORDER — LEVALBUTEROL HCL 1.25 MG/0.5ML IN NEBU
1.2500 mg | INHALATION_SOLUTION | Freq: Three times a day (TID) | RESPIRATORY_TRACT | Status: DC
  Administered 2017-09-12: 1.25 mg via RESPIRATORY_TRACT
  Filled 2017-09-12: qty 0.5

## 2017-09-12 NOTE — Discharge Instructions (Signed)

## 2017-09-12 NOTE — Discharge Summary (Signed)
Physician Discharge Summary  Taylor [name] FEO:[id number] DOB: [birth year] DOA: 09/10/2017  PCP: [facility]  Admit date: 09/10/2017 Discharge date: 09/12/2017  Admitted From: Home Disposition:  Home  Recommendations for Outpatient Follow-up:  1. Follow up with PCP in 1-2 weeks 2. Please obtain BMP/CBC in one week   Home Health: No Equipment/Devices: No  Discharge Condition: stable CODE STATUS: FULL Diet recommendation: Heart Healthy    Brief/Interim Summary:  #) Shortness of breath due to COPD on 3 L: Patient was admitted with shortness of breath.  He has had multiple multiple admissions including for this month for his shortness of breath and COPD.  He has never been noted to be particularly wheezing.  He is on chronic daily steroids.  He has never been trialed on chronic daily opiates.  There is at least appears to be a component of anxiety as is does not clearly have COPD exacerbation.  He was discharged with PRN alprazolam.  He additionally continues to smoke despite his multiple multiple admissions.  He was strongly given counseling.  #) Suicidal ideation: During discussion with this writer patient reported that because of his severe COPD and the fact that he was having to move soon he felt quite hopeless and suicidal.  He reported that he would likely shoot himself or overdose on pills.  He apparently has made these threats before.  Psychiatry was consulted and they cleared him.  #) Hyperlipidemia: Patient was continued on home pravastatin.  #) Type 2 diabetes: Patient's home metformin was held.  He was maintained on sliding scale insulin here.  #) Psych: Patient was continued on home escitalopram and aripiprazole.  Discharge Diagnoses:  Principal Problem:   Anxiety and depression Active Problems:   Bipolar 1 disorder (HCC)   COPD with acute exacerbation ([place])   Prostate cancer ([place])   Acute on chronic respiratory failure with hypoxia  [facility])    Discharge Instructions  Discharge Instructions    Call MD for:  difficulty breathing, headache or visual disturbances   Complete by:  As directed    Call MD for:  hives   Complete by:  As directed    Call MD for:  persistant nausea and vomiting   Complete by:  As directed    Call MD for:  redness, tenderness, or signs of infection (pain, swelling, redness, odor or green/yellow discharge around incision site)   Complete by:  As directed    Call MD for:  severe uncontrolled pain   Complete by:  As directed    Call MD for:  temperature >100.4   Complete by:  As directed    Diet - low sodium heart healthy   Complete by:  As directed    Discharge instructions   Complete by:  As directed    Please follow-up with your primary care doctor in 1 to 2 weeks.   Increase activity slowly   Complete by:  As directed      Allergies as of 09/12/2017      Reactions   Codeine Nausea And Vomiting      Medication List    STOP taking these medications   azithromycin 250 MG tablet Commonly known as:  ZITHROMAX     TAKE these medications   albuterol 108 (90 Base) MCG/ACT inhaler Commonly known as:  PROVENTIL HFA;VENTOLIN HFA Inhale 2 puffs into the lungs every 6 (six) hours as needed for wheezing or shortness of breath.   albuterol (5 MG/ML) 0.5% nebulizer solution  Commonly known as:  PROVENTIL Take 1 mL (5 mg total) by nebulization every 6 (six) hours as needed for wheezing or shortness of breath.   ALPRAZolam 0.5 MG tablet Commonly known as:  XANAX Take 1 tablet (0.5 mg total) by mouth 3 (three) times daily as needed for anxiety.   ARIPiprazole 5 MG tablet Commonly known as:  ABILIFY Take 1 tablet (5 mg total) by mouth daily with breakfast.   blood glucose meter kit and supplies Kit Dispense based on patient and insurance preference. Use up to four times daily as directed. (FOR ICD-9 250.00, 250.01).   escitalopram 10 MG tablet Commonly known as:  LEXAPRO Take 1 tablet  (10 mg total) by mouth daily.   lovastatin 20 MG tablet Commonly known as:  MEVACOR Take 1 tablet (20 mg total) by mouth at bedtime.   metFORMIN 500 MG tablet Commonly known as:  GLUCOPHAGE Take 1 tablet (500 mg total) by mouth 2 (two) times daily with a meal.   mometasone-formoterol 100-5 MCG/ACT Aero Commonly known as:  DULERA Inhale 2 puffs into the lungs 2 (two) times daily.   OXYGEN Inhale 3 L into the lungs.   predniSONE 10 MG tablet Commonly known as:  DELTASONE Take 1 tablet (10 mg total) by mouth daily.   tiotropium 18 MCG inhalation capsule Commonly known as:  SPIRIVA Place 1 capsule (18 mcg total) into inhaler and inhale daily.       Allergies  Allergen Reactions  . Codeine Nausea And Vomiting    Consultations:  Psychiatry   Procedures/Studies: Dg Chest 2 View  Result Date: 08/14/2017 CLINICAL DATA:  Chest pain.  History of COPD. EXAM: CHEST - 2 VIEW COMPARISON:  Chest radiograph August 13, 2017 FINDINGS: Cardiomediastinal silhouette is normal. No pleural effusions or focal consolidations. Similar hyperinflation with flattened hemidiaphragms. Trachea projects midline and there is no pneumothorax. Soft tissue planes and included osseous structures are non-suspicious. Old RIGHT rib fractures. IMPRESSION: COPD.  No focal consolidation. Electronically Signed   By: Elon Alas M.D.   On: 08/14/2017 22:27   Ct Angio Chest Pe W Or Wo Contrast  Result Date: 08/24/2017 CLINICAL DATA:  62 y/o M; worsening shortness of breath. History of COPD. Elevated D-dimer. EXAM: CT ANGIOGRAPHY CHEST WITH CONTRAST TECHNIQUE: Multidetector CT imaging of the chest was performed using the standard protocol during bolus administration of intravenous contrast. Multiplanar CT image reconstructions and MIPs were obtained to evaluate the vascular anatomy. CONTRAST:  156m ISOVUE-370 IOPAMIDOL (ISOVUE-370) INJECTION 76% COMPARISON:  06/26/2017 CT angiogram of the chest. FINDINGS:  Cardiovascular: Satisfactory opacification of the pulmonary arteries to the segmental level. No evidence of pulmonary embolism. Normal heart size. No pericardial effusion. Mild coronary artery calcific atherosclerosis. Mediastinum/Nodes: No enlarged mediastinal, hilar, or axillary lymph nodes. Thyroid gland, trachea, and esophagus demonstrate no significant findings. Lungs/Pleura: Lungs are clear. No pleural effusion or pneumothorax. Stable emphysema. Upper Abdomen: No acute abnormality. Musculoskeletal: No chest wall abnormality. No acute or significant osseous findings. Multiple stable chronic right posterior rib fractures. Review of the MIP images confirms the above findings. IMPRESSION: 1. No pulmonary embolus identified.  No acute pulmonary process. 2. Stable emphysema. Electronically Signed   By: LKristine GarbeM.D.   On: 08/24/2017 23:34   Nm Bone Scan Whole Body  Result Date: 09/01/2017 CLINICAL DATA:  Prostate carcinoma, elevated PSA. History of rib fractures, colon carcinoma EXAM: NUCLEAR MEDICINE WHOLE BODY BONE SCAN TECHNIQUE: Whole body anterior and posterior images were obtained approximately 3 hours after intravenous  injection of radiopharmaceutical. RADIOPHARMACEUTICALS:  21.1 mCi Technetium-64mMDP IV COMPARISON:  CT 08/31/2017 FINDINGS: Focus of increased activity in the region of the lateral left femoral condyle near the knee, suggesting DJD. Otherwise physiologic distribution of radiopharmaceutical. IMPRESSION: No suggestion of osseous metastatic disease. Electronically Signed   By: DLucrezia EuropeM.D.   On: 09/01/2017 15:29   Ct Abdomen Pelvis W Contrast  Result Date: 08/31/2017 CLINICAL DATA:  Prostate cancer. High risk. Staging. EXAM: CT ABDOMEN AND PELVIS WITH CONTRAST TECHNIQUE: Multidetector CT imaging of the abdomen and pelvis was performed using the standard protocol following bolus administration of intravenous contrast. CONTRAST:  1082mOMNIPAQUE IOHEXOL 300 MG/ML  SOLN  COMPARISON:  04/18/2017 FINDINGS: Lower chest: No acute abnormality. Hepatobiliary: No focal liver abnormality is seen. Tiny stone within the dependent portion of the gallbladder noted, image 25/3. No gallbladder wall thickening. No biliary dilatation identified. Pancreas: Unremarkable. No pancreatic ductal dilatation or surrounding inflammatory changes. Spleen: Normal in size without focal abnormality. Adrenals/Urinary Tract: Normal adrenal glands. Small bilateral kidney cysts identified. No mass or hydronephrosis. The urinary bladder appears normal. Stomach/Bowel: Stomach is normal. The small bowel loops have a normal course and caliber. No dilated loops of small bowel. Distal colonic diverticulosis identified without acute inflammation. Vascular/Lymphatic: Aortic atherosclerosis. No aneurysm. No adenopathy identified within the abdomen. No pelvic or inguinal adenopathy. Reproductive: Prostate is unremarkable. Other: No abdominopelvic ascites. There is a left inguinal hernia which contains fat only. Musculoskeletal: Degenerative disc disease noted within the lumbar spine. No suspicious bone lesions identified. IMPRESSION: 1. No findings identified to suggest metastatic disease from prostate cancer. 2. Kidney cysts 3.  Aortic Atherosclerosis (ICD10-I70.0). Electronically Signed   By: TaKerby Moors.D.   On: 08/31/2017 17:58   Dg Chest Portable 1 View  Result Date: 09/10/2017 CLINICAL DATA:  Shortness of breath. EXAM: PORTABLE CHEST 1 VIEW COMPARISON:  Multiple prior exams. Most recent radiograph 09/04/2017. Most recent CT 08/24/2017 FINDINGS: Chronic hyperinflation with emphysema. Normal heart size and mediastinal contours. No focal airspace opacity, pleural effusion, pneumothorax or pulmonary edema. Remote right rib fractures again seen. IMPRESSION: 1. No acute findings. 2.  Emphysema (ICD10-J43.9). Electronically Signed   By: MeJeb Levering.D.   On: 09/10/2017 22:40   Dg Chest Portable 1  View  Result Date: 09/04/2017 CLINICAL DATA:  [age]/o  M; shortness of breath and chest pain. EXAM: PORTABLE CHEST 1 VIEW COMPARISON:  08/30/2017 chest radiograph. FINDINGS: Normal cardiac silhouette. Stable emphysema and hyperinflation of the lungs. No consolidation. Chronic blunting of left costodiaphragmatic angle. No pleural effusion or pneumothorax. No acute osseous abnormality is evident. IMPRESSION: No active disease.  Stable emphysema. Electronically Signed   By: LaKristine Garbe.D.   On: 09/04/2017 20:28   Dg Chest Port 1 View  Result Date: 08/30/2017 CLINICAL DATA:  Acute onset of respiratory distress. EXAM: PORTABLE CHEST 1 VIEW COMPARISON:  Chest radiograph and CTA of the chest performed 08/24/2017 FINDINGS: The lungs are hyperexpanded, with changes of emphysema at the upper lung lobes. There is no evidence of focal opacification, pleural effusion or pneumothorax. The cardiomediastinal silhouette is within normal limits. No acute osseous abnormalities are seen. IMPRESSION: Findings of emphysema at the upper lung lobes. No acute cardiopulmonary process seen. Electronically Signed   By: JeGarald Balding.D.   On: 08/30/2017 22:17   Dg Chest Port 1 View  Result Date: 08/24/2017 CLINICAL DATA:  Shortness of breath today, history COPD, asthma, pneumonia, prostate cancer, former smoker EXAM: PORTABLE CHEST 1 VIEW  COMPARISON:  Portable exam 1822 hours compared to 08/14/2017 FINDINGS: Normal heart size, mediastinal contours, and pulmonary vascularity. Lungs emphysematous but clear. No pulmonary infiltrate, pleural effusion or pneumothorax. Bones demineralized with old healed fractures of the posterior RIGHT third fourth and fifth ribs. IMPRESSION: COPD changes without acute infiltrate. Electronically Signed   By: Lavonia Dana M.D.   On: 08/24/2017 18:38       Subjective:   Discharge Exam: Vitals:   09/12/17 0852 09/12/17 0942  BP: 117/79 115/80  Pulse: 80 76  Resp:  (!) 28  Temp:  98.1 F (36.7 C)   SpO2: 99% 100%   Vitals:   09/12/17 0416 09/12/17 0500 09/12/17 0852 09/12/17 0942  BP:  135/88 117/79 115/80  Pulse:  84 80 76  Resp:  14  (!) 28  Temp: 98.5 F (36.9 C)  98.1 F (36.7 C)   TempSrc: Oral  Oral   SpO2:  95% 99% 100%  Height:        General exam: No acute distress Respiratory system: Mild increased work of breathing, hyperinflated, prolonged expiratory phase, scattered wheezes, no rhonchi or rales Cardiovascular system: Heart sounds, regular rate and rhythm, no murmurs Gastrointestinal system: Abdomen is nondistended, soft and nontender. No organomegaly or masses felt. Normal bowel sounds heard. Central nervous system: Alert and oriented. No focal neurological deficits. Extremities: Lower extremity edema Skin: No rashes visible skin, friable skin Psychiatry: Judgement and insight appear poor l. Mood & affect improved     The results of significant diagnostics from this hospitalization (including imaging, microbiology, ancillary and laboratory) are listed below for reference.     Microbiology: Recent Results (from the past 240 hour(s))  MRSA PCR Screening     Status: None   Collection Time: 09/05/17  6:50 AM  Result Value Ref Range Status   MRSA by PCR NEGATIVE NEGATIVE Final    Comment:        The GeneXpert MRSA Assay (FDA approved for NASAL specimens only), is one component of a comprehensive MRSA colonization surveillance program. It is not intended to diagnose MRSA infection nor to guide or monitor treatment for MRSA infections. Performed at [facility], [place] [address], [place], [place] [address]      Labs: BNP (last 3 results) Recent Labs    06/13/17 1003 08/24/17 1838 09/10/17 2215  BNP 33.8 34.7 89.2   Basic Metabolic Panel: Recent Labs  Lab 09/06/17 0448 09/07/17 0736 09/10/17 2214  NA 137 139 141  K 4.4 4.6 4.2  CL 96* 96* 93*  CO2 35* 36* 40*  GLUCOSE 289* 202* 241*  BUN _0 CREATININE  0.74 0.84 0.87  CALCIUM 8.7* 9.0 8.8*   Liver Function Tests: Recent Labs  Lab 09/06/17 0448 09/10/17 2214  AST 13* 18  ALT 23 24  ALKPHOS 48 61  BILITOT 0.6 0.6  PROT 5.2* 6.0*  ALBUMIN 3.1* 3.9   No results for input(s): LIPASE, AMYLASE in the last 168 hours. No results for input(s): AMMONIA in the last 168 hours. CBC: Recent Labs  Lab 09/06/17 0448 09/07/17 0736 09/10/17 2214  WBC 18.0* 18.3* 14.5*  NEUTROABS  --   --  10.7*  HGB 12.6* 14.3 14.8  HCT 38.9* 45.1 47.0  MCV 93.1 94.0 97.1  PLT 190 225 240   Cardiac Enzymes: Recent Labs  Lab 09/10/17 2214  TROPONINI <0.03   BNP: Invalid input(s): POCBNP CBG: Recent Labs  Lab 09/11/17 0734 09/11/17 1159 09/11/17 1711 09/11/17 2153  09/12/17 0730  GLUCAP 263* 263* 260* 117* 138*   D-Dimer No results for input(s): DDIMER in the last 72 hours. Hgb A1c No results for input(s): HGBA1C in the last 72 hours. Lipid Profile No results for input(s): CHOL, HDL, LDLCALC, TRIG, CHOLHDL, LDLDIRECT in the last 72 hours. Thyroid function studies No results for input(s): TSH, T4TOTAL, T3FREE, THYROIDAB in the last 72 hours.  Invalid input(s): FREET3 Anemia work up No results for input(s): VITAMINB12, FOLATE, FERRITIN, TIBC, IRON, RETICCTPCT in the last 72 hours. Urinalysis    Component Value Date/Time   COLORURINE YELLOW 06/13/2017 0203   APPEARANCEUR CLEAR 06/13/2017 0203   LABSPEC 1.018 06/13/2017 0203   PHURINE 6.0 06/13/2017 0203   GLUCOSEU 50 (A) 06/13/2017 0203   HGBUR NEGATIVE 06/13/2017 0203   BILIRUBINUR NEGATIVE 06/13/2017 0203   KETONESUR NEGATIVE 06/13/2017 0203   PROTEINUR NEGATIVE 06/13/2017 0203   UROBILINOGEN 1.0 01/09/2013 1843   NITRITE NEGATIVE 06/13/2017 0203   LEUKOCYTESUR NEGATIVE 06/13/2017 0203   Sepsis Labs Invalid input(s): PROCALCITONIN,  WBC,  LACTICIDVEN Microbiology Recent Results (from the past 240 hour(s))  MRSA PCR Screening     Status: None   Collection Time: 09/05/17   6:50 AM  Result Value Ref Range Status   MRSA by PCR NEGATIVE NEGATIVE Final    Comment:        The GeneXpert MRSA Assay (FDA approved for NASAL specimens only), is one component of a comprehensive MRSA colonization surveillance program. It is not intended to diagnose MRSA infection nor to guide or monitor treatment for MRSA infections. Performed at [facility], [place] [address], [place], [place] [address]      Time coordinating discharge: 25  SIGNED:   Cristy Folks, MD  [facility] 09/12/2017, 10:56 AM  If 7PM-7AM, please contact night-coverage [email or web address] Password [email or web address]

## 2017-09-14 ENCOUNTER — Encounter (HOSPITAL_COMMUNITY): Payer: Self-pay | Admitting: Emergency Medicine

## 2017-09-14 ENCOUNTER — Inpatient Hospital Stay (HOSPITAL_COMMUNITY)
Admission: EM | Admit: 2017-09-14 | Discharge: 2017-09-19 | DRG: 190 | Disposition: A | Discharge status: Home / Self Care | Payer: Medicaid Other | Attending: Internal Medicine | Admitting: Internal Medicine

## 2017-09-14 ENCOUNTER — Emergency Department (HOSPITAL_COMMUNITY): Payer: Medicaid Other

## 2017-09-14 ENCOUNTER — Other Ambulatory Visit: Payer: Self-pay

## 2017-09-14 DIAGNOSIS — J9611 Chronic respiratory failure with hypoxia: Secondary | ICD-10-CM | POA: Diagnosis not present

## 2017-09-14 DIAGNOSIS — E785 Hyperlipidemia, unspecified: Secondary | ICD-10-CM | POA: Diagnosis present

## 2017-09-14 DIAGNOSIS — F329 Major depressive disorder, single episode, unspecified: Secondary | ICD-10-CM | POA: Diagnosis present

## 2017-09-14 DIAGNOSIS — E1165 Type 2 diabetes mellitus with hyperglycemia: Secondary | ICD-10-CM

## 2017-09-14 DIAGNOSIS — J9622 Acute and chronic respiratory failure with hypercapnia: Secondary | ICD-10-CM | POA: Diagnosis present

## 2017-09-14 DIAGNOSIS — F17210 Nicotine dependence, cigarettes, uncomplicated: Secondary | ICD-10-CM | POA: Diagnosis present

## 2017-09-14 DIAGNOSIS — Z7951 Long term (current) use of inhaled steroids: Secondary | ICD-10-CM

## 2017-09-14 DIAGNOSIS — Z66 Do not resuscitate: Secondary | ICD-10-CM

## 2017-09-14 DIAGNOSIS — F1010 Alcohol abuse, uncomplicated: Secondary | ICD-10-CM | POA: Diagnosis present

## 2017-09-14 DIAGNOSIS — F419 Anxiety disorder, unspecified: Secondary | ICD-10-CM | POA: Diagnosis present

## 2017-09-14 DIAGNOSIS — J9612 Chronic respiratory failure with hypercapnia: Secondary | ICD-10-CM

## 2017-09-14 DIAGNOSIS — Z515 Encounter for palliative care: Secondary | ICD-10-CM

## 2017-09-14 DIAGNOSIS — J441 Chronic obstructive pulmonary disease with (acute) exacerbation: Secondary | ICD-10-CM | POA: Diagnosis not present

## 2017-09-14 DIAGNOSIS — Z79899 Other long term (current) drug therapy: Secondary | ICD-10-CM

## 2017-09-14 DIAGNOSIS — Z7984 Long term (current) use of oral hypoglycemic drugs: Secondary | ICD-10-CM

## 2017-09-14 DIAGNOSIS — Z9981 Dependence on supplemental oxygen: Secondary | ICD-10-CM

## 2017-09-14 DIAGNOSIS — F319 Bipolar disorder, unspecified: Secondary | ICD-10-CM | POA: Diagnosis present

## 2017-09-14 DIAGNOSIS — Z85038 Personal history of other malignant neoplasm of large intestine: Secondary | ICD-10-CM

## 2017-09-14 DIAGNOSIS — J9621 Acute and chronic respiratory failure with hypoxia: Secondary | ICD-10-CM | POA: Diagnosis present

## 2017-09-14 DIAGNOSIS — R000 Tachycardia, unspecified: Secondary | ICD-10-CM

## 2017-09-14 DIAGNOSIS — F32A Depression, unspecified: Secondary | ICD-10-CM | POA: Diagnosis present

## 2017-09-14 DIAGNOSIS — Z8546 Personal history of malignant neoplasm of prostate: Secondary | ICD-10-CM

## 2017-09-14 LAB — BLOOD GAS, ARTERIAL
Acid-Base Excess: 10.3 mmol/L — ABNORMAL HIGH (ref 0.0–2.0)
BICARBONATE: 37.5 mmol/L — AB (ref 20.0–28.0)
Drawn by: 103701
O2 Content: 3 L/min
O2 Saturation: 95.2 %
PATIENT TEMPERATURE: 98.6
PCO2 ART: 63 mmHg — AB (ref 32.0–48.0)
PO2 ART: 74.3 mmHg — AB (ref 83.0–108.0)
pH, Arterial: 7.392 (ref 7.350–7.450)

## 2017-09-14 LAB — BASIC METABOLIC PANEL
Anion gap: 10 (ref 5–15)
BUN: 19 mg/dL (ref 8–23)
CALCIUM: 8.6 mg/dL — AB (ref 8.9–10.3)
CO2: 35 mmol/L — AB (ref 22–32)
Chloride: 97 mmol/L — ABNORMAL LOW (ref 98–111)
Creatinine, Ser: 0.68 mg/dL (ref 0.61–1.24)
GFR calc Af Amer: >60 mL/min (ref >60)
GFR calc non Af Amer: >60 mL/min (ref >60)
GLUCOSE: 227 mg/dL — AB (ref 70–99)
Potassium: 4.4 mmol/L (ref 3.5–5.1)
Sodium: 142 mmol/L (ref 135–145)

## 2017-09-14 LAB — GLUCOSE, CAPILLARY
GLUCOSE-CAPILLARY: 303 mg/dL — AB (ref 70–99)
Glucose-Capillary: 452 mg/dL — ABNORMAL HIGH (ref 70–99)

## 2017-09-14 LAB — CBC WITH DIFFERENTIAL/PLATELET
BASOS PCT: 0 %
Basophils Absolute: 0 10*3/uL (ref 0.0–0.1)
Eosinophils Absolute: 0 10*3/uL (ref 0.0–0.7)
Eosinophils Relative: 0 %
HEMATOCRIT: 43.2 % (ref 39.0–52.0)
Hemoglobin: 13.7 g/dL (ref 13.0–17.0)
Lymphocytes Relative: 19 %
Lymphs Abs: 1.8 10*3/uL (ref 0.7–4.0)
MCH: 30.6 pg (ref 26.0–34.0)
MCHC: 31.7 g/dL (ref 30.0–36.0)
MCV: 96.6 fL (ref 78.0–100.0)
MONO ABS: 0.4 10*3/uL (ref 0.1–1.0)
MONOS PCT: 4 %
NEUTROS ABS: 7 10*3/uL (ref 1.7–7.7)
Neutrophils Relative %: 77 %
Platelets: 181 10*3/uL (ref 150–400)
RBC: 4.47 MIL/uL (ref 4.22–5.81)
RDW: 12.9 % (ref 11.5–15.5)
WBC: 9.2 10*3/uL (ref 4.0–10.5)

## 2017-09-14 LAB — CBG MONITORING, ED: Glucose-Capillary: 380 mg/dL — ABNORMAL HIGH (ref 70–99)

## 2017-09-14 MED ORDER — MOMETASONE FURO-FORMOTEROL FUM 100-5 MCG/ACT IN AERO
2.0000 | INHALATION_SPRAY | Freq: Two times a day (BID) | RESPIRATORY_TRACT | Status: DC
  Administered 2017-09-14 – 2017-09-19 (×10): 2 via RESPIRATORY_TRACT
  Filled 2017-09-14: qty 8.8

## 2017-09-14 MED ORDER — ACETAMINOPHEN 650 MG RE SUPP: 650.0000 mg | Freq: Four times a day (QID) | RECTAL | Status: DC | PRN

## 2017-09-14 MED ORDER — INSULIN ASPART 100 UNIT/ML ~~LOC~~ SOLN
7.0000 [IU] | Freq: Once | SUBCUTANEOUS | Status: AC
  Administered 2017-09-14: 7 [IU] via SUBCUTANEOUS

## 2017-09-14 MED ORDER — ENOXAPARIN SODIUM 40 MG/0.4ML ~~LOC~~ SOLN
40.0000 mg | SUBCUTANEOUS | Status: DC
  Administered 2017-09-14 – 2017-09-18 (×5): 40 mg via SUBCUTANEOUS
  Filled 2017-09-14 (×6): qty 0.4

## 2017-09-14 MED ORDER — INSULIN ASPART 100 UNIT/ML ~~LOC~~ SOLN
0.0000 [IU] | Freq: Three times a day (TID) | SUBCUTANEOUS | Status: DC
  Administered 2017-09-14: 9 [IU] via SUBCUTANEOUS
  Filled 2017-09-14: qty 1

## 2017-09-14 MED ORDER — ONDANSETRON HCL 4 MG/2ML IJ SOLN: 4.0000 mg | Freq: Four times a day (QID) | INTRAMUSCULAR | Status: DC | PRN

## 2017-09-14 MED ORDER — PRAVASTATIN SODIUM 20 MG PO TABS
20.0000 mg | ORAL_TABLET | Freq: Every day | ORAL | Status: DC
  Administered 2017-09-15 – 2017-09-18 (×4): 20 mg via ORAL
  Filled 2017-09-14 (×4): qty 1

## 2017-09-14 MED ORDER — LORAZEPAM 1 MG PO TABS
1.0000 mg | ORAL_TABLET | Freq: Once | ORAL | Status: AC
  Administered 2017-09-14: 1 mg via ORAL
  Filled 2017-09-14: qty 1

## 2017-09-14 MED ORDER — ACETAMINOPHEN 325 MG PO TABS
650.0000 mg | ORAL_TABLET | Freq: Four times a day (QID) | ORAL | Status: DC | PRN
  Administered 2017-09-15 – 2017-09-18 (×2): 650 mg via ORAL
  Filled 2017-09-14 (×3): qty 2

## 2017-09-14 MED ORDER — ALBUTEROL (5 MG/ML) CONTINUOUS INHALATION SOLN
15.0000 mg/h | INHALATION_SOLUTION | Freq: Once | RESPIRATORY_TRACT | Status: AC
  Administered 2017-09-14: 15 mg/h via RESPIRATORY_TRACT
  Filled 2017-09-14: qty 20

## 2017-09-14 MED ORDER — PREDNISONE 20 MG PO TABS
20.0000 mg | ORAL_TABLET | Freq: Every day | ORAL | Status: DC
  Administered 2017-09-15 – 2017-09-19 (×5): 20 mg via ORAL
  Filled 2017-09-14 (×5): qty 1

## 2017-09-14 MED ORDER — ALBUTEROL SULFATE (5 MG/ML) 0.5% IN NEBU: 5.0000 mg | INHALATION_SOLUTION | Freq: Four times a day (QID) | RESPIRATORY_TRACT | Status: DC | PRN

## 2017-09-14 MED ORDER — METHYLPREDNISOLONE SODIUM SUCC 125 MG IJ SOLR
125.0000 mg | Freq: Once | INTRAMUSCULAR | Status: AC
  Administered 2017-09-14: 125 mg via INTRAVENOUS
  Filled 2017-09-14: qty 2

## 2017-09-14 MED ORDER — ALPRAZOLAM 0.5 MG PO TABS
0.5000 mg | ORAL_TABLET | Freq: Three times a day (TID) | ORAL | Status: DC | PRN
  Administered 2017-09-14: 0.5 mg via ORAL
  Filled 2017-09-14: qty 1

## 2017-09-14 MED ORDER — TAMSULOSIN HCL 0.4 MG PO CAPS
0.4000 mg | ORAL_CAPSULE | Freq: Every day | ORAL | Status: DC
  Administered 2017-09-15 – 2017-09-19 (×5): 0.4 mg via ORAL
  Filled 2017-09-14 (×5): qty 1

## 2017-09-14 MED ORDER — ESCITALOPRAM OXALATE 10 MG PO TABS
10.0000 mg | ORAL_TABLET | Freq: Every day | ORAL | Status: DC
  Administered 2017-09-14 – 2017-09-19 (×6): 10 mg via ORAL
  Filled 2017-09-14 (×6): qty 1

## 2017-09-14 MED ORDER — POLYETHYLENE GLYCOL 3350 17 G PO PACK: 17.0000 g | PACK | Freq: Every day | ORAL | Status: DC | PRN

## 2017-09-14 MED ORDER — ALBUTEROL SULFATE (2.5 MG/3ML) 0.083% IN NEBU: 2.5000 mg | INHALATION_SOLUTION | Freq: Four times a day (QID) | RESPIRATORY_TRACT | Status: DC | PRN

## 2017-09-14 MED ORDER — TIOTROPIUM BROMIDE MONOHYDRATE 18 MCG IN CAPS
18.0000 ug | ORAL_CAPSULE | Freq: Every day | RESPIRATORY_TRACT | Status: DC
  Administered 2017-09-15 – 2017-09-19 (×5): 18 ug via RESPIRATORY_TRACT
  Filled 2017-09-14: qty 5

## 2017-09-14 MED ORDER — ONDANSETRON HCL 4 MG PO TABS
4.0000 mg | ORAL_TABLET | Freq: Four times a day (QID) | ORAL | Status: DC | PRN
  Administered 2017-09-18: 4 mg via ORAL
  Filled 2017-09-14: qty 1

## 2017-09-14 MED ORDER — ARIPIPRAZOLE 5 MG PO TABS
5.0000 mg | ORAL_TABLET | Freq: Every day | ORAL | Status: DC
  Administered 2017-09-15 – 2017-09-19 (×5): 5 mg via ORAL
  Filled 2017-09-14 (×5): qty 1

## 2017-09-14 NOTE — H&P (Signed)
History and Physical    Taylor [name] MLJ:[id number] DOB: 09-Jul-1955 DOA: 09/14/2017  PCP: [facility] Patient coming from: Home  Chief Complaint: Shortness of breath  HPI: Taylor [name] is a 62 y.o. male with medical history significant of COPD, alcohol abuse, anxiety, depression, hyperlipidemia, tobacco abuse.  Patient presented secondary to worsening dyspnea.  Patient was just discharged from the hospital 2 days ago.  He tried using his albuterol inhaler and nebulizer in addition to [facility] with no improvement of his symptoms.  He reports decreased exercise tolerance.  ED Course: Vitals: No temp. Tachycardia to 100s with jumps to 140s, respirations of low 20s, normotensive, on 3L [place] Labs: pH of 7.392. pO2 of 74.3, pCO2 of 63, CO2 of 35 Imaging: Hyperinflated lungs, no acute disease Medications/Course: Continuous nebulizer, Ativan, Solumedrol  Review of Systems: Review of Systems  Respiratory: Positive for cough, shortness of breath and wheezing.   Cardiovascular:       Chest burning  Gastrointestinal: Negative for nausea and vomiting.  All other systems reviewed and are negative.   Past Medical History:  Diagnosis Date  . Asthma   . Bipolar 1 disorder ([place]) 02/05/2012  . Colon cancer ([place]) 04/11/11   adenocarcinoma of colon, 7/19 nodes pos.FINISHED CHEMO/DR. SHERRILL  . COPD (chronic obstructive pulmonary disease) ([place])    SMOKER  . Depression   . Emphysema of lung ([place])   . Full dentures   . Hemorrhoids   . On home oxygen therapy    "3L; 24/7" (05/29/2017)  . Pneumonia ~ 2016   "double pneumonia"  . Prostate cancer ([place])    Gleason score = 7, supposed to have radiation therapy but he has not followed up (05/29/2017)  . Rib fractures    hx of    Past Surgical History:  Procedure Laterality Date  . COLON SURGERY  04/11/11   Sigmoid colectomy  . COLOSTOMY REVISION  04/11/2011   Procedure: COLON RESECTION SIGMOID;  Surgeon: [name],  MD;  Location: [facility];  Service: General;  Laterality: N/A;  low anterior colon resection   . HERNIA REPAIR    . INGUINAL HERNIA REPAIR Right 05/01/2012   Procedure: HERNIA REPAIR INGUINAL ADULT;  Surgeon: [name], MD;  Location: [facility];  Service: General;  Laterality: Right;  . INSERTION OF MESH Right 05/01/2012   Procedure: INSERTION OF MESH;  Surgeon: [name], MD;  Location: [facility];  Service: General;  Laterality: Right;  . PORT-A-CATH REMOVAL Left 05/01/2012   Procedure: REMOVAL Infusion Port;  Surgeon: [name], MD;  Location: [facility];  Service: General;  Laterality: Left;  . PORTACATH PLACEMENT  05/02/2011   Procedure: INSERTION PORT-A-CATH;  Surgeon: [name], MD;  Location: [facility];  Service: General;  Laterality: N/A;  . PROSTATE BIOPSY       reports that he quit smoking about 3 months ago. His smoking use included cigarettes. He has a 4.60 pack-year smoking history. He has quit using smokeless tobacco.  His smokeless tobacco use included chew. He reports that he drank alcohol. He reports that he does not use drugs.  Allergies  Allergen Reactions  . Codeine Nausea And Vomiting    Family History  Problem Relation Age of Onset  . Heart disease Father   . Lung cancer Maternal Uncle        smoked    Prior to Admission medications   Medication Sig Start Date End Date Taking? Authorizing Provider  albuterol (PROVENTIL HFA;VENTOLIN HFA) 108 (90 Base) MCG/ACT inhaler Inhale 2 puffs into the lungs every 6 (six) hours as needed for wheezing or shortness of breath. 08/26/17  Yes Isabelle Course, MD  albuterol (PROVENTIL) (5 MG/ML) 0.5% nebulizer solution Take 1 mL (5 mg total) by nebulization every 6 (six) hours as needed for wheezing or shortness of breath. 09/07/17  Yes Danford, Suann Larry, MD  ALPRAZolam Duanne Moron) 0.5 MG tablet Take 1 tablet (0.5 mg total) by mouth 3 (three) times daily as needed for anxiety. 09/12/17  Yes Purohit, Konrad Dolores, MD  ARIPiprazole (ABILIFY) 5 MG  tablet Take 1 tablet (5 mg total) by mouth daily with breakfast. 08/27/17  Yes Isabelle Course, MD  blood glucose meter kit and supplies KIT Dispense based on patient and insurance preference. Use up to four times daily as directed. (FOR ICD-9 250.00, 250.01). 09/07/17  Yes Danford, Suann Larry, MD  escitalopram (LEXAPRO) 10 MG tablet Take 1 tablet (10 mg total) by mouth daily. 08/27/17  Yes Isabelle Course, MD  lovastatin (MEVACOR) 20 MG tablet Take 1 tablet (20 mg total) by mouth at bedtime. 05/18/17  Yes Nita Sells, MD  mometasone-formoterol (DULERA) 100-5 MCG/ACT AERO Inhale 2 puffs into the lungs 2 (two) times daily. 05/18/17  Yes Nita Sells, MD  OXYGEN Inhale 3 L into the lungs.    Yes [provider]  predniSONE (DELTASONE) 20 MG tablet Take 20 mg by mouth daily with breakfast.   Yes [provider]  tamsulosin (FLOMAX) 0.4 MG CAPS capsule Take 0.4 mg by mouth daily.   Yes [provider]  tiotropium (SPIRIVA HANDIHALER) 18 MCG inhalation capsule Place 1 capsule (18 mcg total) into inhaler and inhale daily. 08/29/17  Yes Mesner, Corene Cornea, MD  metFORMIN (GLUCOPHAGE) 500 MG tablet Take 1 tablet (500 mg total) by mouth 2 (two) times daily with a meal. Patient not taking: Reported on 09/14/2017 09/07/17   Edwin Dada, MD  predniSONE (DELTASONE) 10 MG tablet Take 1 tablet (10 mg total) by mouth daily. Patient not taking: Reported on 09/14/2017 09/07/17   Edwin Dada, MD    Physical Exam:  Physical Exam  Constitutional: He is oriented to person, place, and time. He appears well-developed and well-nourished. No distress.  HENT:  Mouth/Throat: Oropharynx is clear and moist.  Eyes: Pupils are equal, round, and reactive to light. Conjunctivae and EOM are normal.  Neck: Normal range of motion.  Cardiovascular: Regular rhythm and normal heart sounds. Tachycardia present.  No murmur heard. Pulmonary/Chest: Effort normal. No accessory muscle  usage. No respiratory distress. He has decreased breath sounds. He has no wheezes. He has no rales.  Abdominal: Soft. Bowel sounds are normal. He exhibits no distension. There is no tenderness. There is no rebound and no guarding.  Musculoskeletal: Normal range of motion. He exhibits no edema or tenderness.  Lymphadenopathy:    He has no cervical adenopathy.  Neurological: He is alert and oriented to person, place, and time.  Tremors.  Skin: Skin is warm and dry. He is not diaphoretic.  Nursing note and vitals reviewed.    Labs on Admission: I have personally reviewed following labs and imaging studies  CBC: Recent Labs  Lab 09/10/17 2214 09/14/17 1436  WBC 14.5* 9.2  NEUTROABS 10.7* 7.0  HGB 14.8 13.7  HCT 47.0 43.2  MCV 97.1 96.6  PLT 240 288    Basic Metabolic Panel: Recent Labs  Lab 09/10/17 2214 09/14/17 1436  NA 141 142  K 4.2 4.4  CL 93* 97*  CO2 40* 35*  GLUCOSE 241* 227*  BUN 13 19  CREATININE 0.87 0.68  CALCIUM 8.8* 8.6*    GFR: Estimated Creatinine Clearance: 96.1 mL/min (by C-G formula based on SCr of 0.68 mg/dL).  Liver Function Tests: Recent Labs  Lab 09/10/17 2214  AST 18  ALT 24  ALKPHOS 61  BILITOT 0.6  PROT 6.0*  ALBUMIN 3.9   No results for input(s): LIPASE, AMYLASE in the last 168 hours. No results for input(s): AMMONIA in the last 168 hours.  Coagulation Profile: No results for input(s): INR, PROTIME in the last 168 hours.  Cardiac Enzymes: Recent Labs  Lab 09/10/17 2214  TROPONINI <0.03    BNP (last 3 results) No results for input(s): PROBNP in the last 8760 hours.  HbA1C: No results for input(s): HGBA1C in the last 72 hours.  CBG: Recent Labs  Lab 09/11/17 1159 09/11/17 1711 09/11/17 2153 09/12/17 0730 09/12/17 1203  GLUCAP 263* 260* 117* 138* 199*    Lipid Profile: No results for input(s): CHOL, HDL, LDLCALC, TRIG, CHOLHDL, LDLDIRECT in the last 72 hours.  Thyroid Function Tests: No results for input(s):  TSH, T4TOTAL, FREET4, T3FREE, THYROIDAB in the last 72 hours.  Anemia Panel: No results for input(s): VITAMINB12, FOLATE, FERRITIN, TIBC, IRON, RETICCTPCT in the last 72 hours.  Urine analysis:    Component Value Date/Time   COLORURINE YELLOW 06/13/2017 0203   APPEARANCEUR CLEAR 06/13/2017 0203   LABSPEC 1.018 06/13/2017 0203   PHURINE 6.0 06/13/2017 0203   GLUCOSEU 50 (A) 06/13/2017 0203   HGBUR NEGATIVE 06/13/2017 0203   BILIRUBINUR NEGATIVE 06/13/2017 0203   KETONESUR NEGATIVE 06/13/2017 0203   PROTEINUR NEGATIVE 06/13/2017 0203   UROBILINOGEN 1.0 01/09/2013 1843   NITRITE NEGATIVE 06/13/2017 0203   LEUKOCYTESUR NEGATIVE 06/13/2017 0203     Radiological Exams on Admission: Dg Chest Port 1 View  Result Date: 09/14/2017 CLINICAL DATA:  Cough and dyspnea EXAM: PORTABLE CHEST 1 VIEW COMPARISON:  09/10/2017 chest radiograph. FINDINGS: Stable cardiomediastinal silhouette with normal heart size. No pneumothorax. No pleural effusion. Hyperinflated lungs. Emphysema. No pulmonary edema. No acute consolidative airspace disease. Healed deformities in multiple posterior right ribs. IMPRESSION: 1. No acute cardiopulmonary disease. 2. Hyperinflated lungs and emphysema, suggesting COPD. Electronically Signed   By: Ilona Sorrel M.D.   On: 09/14/2017 14:23    EKG: Independently reviewed. Sinus tachycardia  Assessment/Plan Active Problems:   Alcohol abuse   COPD exacerbation (HCC)   Anxiety and depression   Chronic respiratory failure with hypoxia and hypercapnia (HCC)    COPD exacerbation Patient baseline oxygen requirement. Wheezing. Cough. -Continue albuterol and prednisone -Palliative care consult for goals of care -Continue Dulera and Spiriva  Chronic respiratory failure with hypoxia and hypercapnia On 3L at baseline. -Continue oxygen therapy to keep O2 >88%  Sinus tachycardia Significant tachycardia to 140s even when at rest. In setting of albuterol  nebs -Telemetry  Diabetes mellitus Hemoglobin A1C of 7.2%. Steroid inducedf -SSI  Bipolar disorder Anxiety -Continue Lexapro, Abiligy, Xanax  Prostate cancer -Outpatient follow-up with urology per goals of care -Continue Flomax  Hyperlipidemia -Continue Lovastatin  Alcohol abuse -CIWA   DVT prophylaxis: Lovenox Code Status: DNR Family Communication: None at bedside Disposition Plan: Telemetry Consults called: Palliative care Admission status: Observation   Cordelia Poche, MD [facility]  If 7PM-7AM, please contact night-coverage [email or web address] Password [facility]  09/14/2017, 6:00 PM

## 2017-09-14 NOTE — ED Provider Notes (Signed)
[facility] DEPT Provider Note   CSN: [id number] Arrival date & time: 09/14/17  1339     History   Chief Complaint Chief Complaint  Patient presents with  . Respiratory Distress    HPI Taylor [name] is a 62 y.o. male.  HPI  The patient is a 62 year old male who unfortunately has a very difficult to control COPD and a brief biopsy of the medical record shows that since June he has been seen 9 times and admitted for of those 9 times including most recently on 21 August.  According to these prior discharge summaries the patient has had some intermittent compliance with his medications including his prednisone which he states he takes chronically bored for which she had endorsed not getting prescriptions filled in the past, he also has a history of bipolar disorder and anxiety which was thought to be a component of his shortness of breath, the patient states that he does still smoke cigarettes although it is a small amount.  He reports that he takes daily prednisone, he does not recall the number of pills that he takes or the amount in milligrams, he takes his albuterol inhalers at home but states that this is not been helping and over the last couple of hours has developed severe shortness of breath which prompted his visit.  He presented tachypneic, splinting, expiratory wheezing, using pursed lip breathing and not able to give much in the way of history.  He denies being exposed to any new pets or animals, any new food exposures, he has not had upper respiratory symptoms other than his chronic cough.  He was prescribed as needed alprazolam when he left the hospital a couple of days ago.  It is not clear whether he has been taking this.  Level 5 caveat applies secondary to respiratory distress  Past Medical History:  Diagnosis Date  . Asthma   . Bipolar 1 disorder ([place]) 02/05/2012  . Colon cancer ([place]) 04/11/11   adenocarcinoma of colon, 7/19 nodes  pos.FINISHED CHEMO/DR. SHERRILL  . COPD (chronic obstructive pulmonary disease) ([place])    SMOKER  . Depression   . Emphysema of lung ([place])   . Full dentures   . Hemorrhoids   . On home oxygen therapy    "3L; 24/7" (05/29/2017)  . Pneumonia ~ 2016   "double pneumonia"  . Prostate cancer ([place])    Gleason score = 7, supposed to have radiation therapy but he has not followed up (05/29/2017)  . Rib fractures    hx of    Patient Active Problem List   Diagnosis Date Noted  . Acute on chronic respiratory failure with hypoxia ([place]) 09/11/2017  . Anxiety and depression   . Prostate cancer ([place]) 09/04/2017  . Acute respiratory failure with hypercapnia ([place])   . SOB (shortness of breath)   . Malnutrition of moderate degree 06/14/2017  . Hyperglycemia 06/08/2017  . Constipation 06/08/2017  . SIRS (systemic inflammatory response syndrome) ([place]) 05/21/2017  . Tobacco abuse 05/13/2017  . HLD (hyperlipidemia) 05/13/2017  . Leukocytosis 04/06/2017  . Adjustment disorder with depressed mood 03/28/2017  . Adjustment disorder with mixed disturbance of emotions and conduct   . Acute on chronic respiratory failure with hypercapnia ([place]) 03/24/2017  . Normocytic normochromic anemia 03/24/2017  . COPD with acute exacerbation ([place]) 10/22/2016  . Alcohol abuse 01/09/2013  . COPD exacerbation ([place]) 01/09/2013  . Smoker 12/16/2012  . Inguinal hernia unilateral, non-recurrent, right 05/01/2012  . Dyspepsia 02/05/2012  .  Bipolar 1 disorder ([place]) 02/05/2012  . 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 COPD  GOLD III 02/03/2012  . Thrombocytopenia ([place]) 02/03/2012  . Depression   . 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 COLON SURGERY  04/11/11   Sigmoid colectomy  . COLOSTOMY REVISION  04/11/2011   Procedure: COLON RESECTION SIGMOID;  Surgeon: [name], MD;  [facility];  Service: General;  Laterality: N/A;  low anterior colon  resection   . HERNIA REPAIR    . INGUINAL HERNIA REPAIR Right 05/01/2012   Procedure: HERNIA REPAIR INGUINAL ADULT;  Surgeon: [name], MD;  [facility];  Service: General;  Laterality: Right;  . INSERTION OF MESH Right 05/01/2012   Procedure: INSERTION OF MESH;  Surgeon: [name], MD;  [facility];  Service: General;  Laterality: Right;  . PORT-A-CATH REMOVAL Left 05/01/2012   Procedure: REMOVAL Infusion Port;  Surgeon: [name], MD;  [facility];  Service: General;  Laterality: Left;  . PORTACATH PLACEMENT  05/02/2011   Procedure: INSERTION PORT-A-CATH;  Surgeon: [name], MD;  [facility];  Service: General;  Laterality: N/A;  . PROSTATE BIOPSY          Home Medications    Prior to Admission medications   Medication Sig Start Date End Date Taking? Authorizing Provider  albuterol (PROVENTIL HFA;VENTOLIN HFA) 108 (90 Base) MCG/ACT inhaler Inhale 2 puffs into the lungs every 6 (six) hours as needed for wheezing or shortness of breath. 08/26/17  Yes Isabelle Course, MD  albuterol (PROVENTIL) (5 MG/ML) 0.5% nebulizer solution Take 1 mL (5 mg total) by nebulization every 6 (six) hours as needed for wheezing or shortness of breath. 09/07/17  Yes Danford, Suann Larry, MD  ALPRAZolam Duanne Moron) 0.5 MG tablet Take 1 tablet (0.5 mg total) by mouth 3 (three) times daily as needed for anxiety. 09/12/17  Yes Purohit, Konrad Dolores, MD  ARIPiprazole (ABILIFY) 5 MG tablet Take 1 tablet (5 mg total) by mouth daily with breakfast. 08/27/17  Yes Isabelle Course, MD  blood glucose meter kit and supplies KIT Dispense based on patient and insurance preference. Use up to four times daily as directed. (FOR ICD-9 250.00, 250.01). 09/07/17  Yes Danford, Suann Larry, MD  escitalopram (LEXAPRO) 10 MG tablet Take 1 tablet (10 mg total) by mouth daily. 08/27/17  Yes Isabelle Course, MD  lovastatin (MEVACOR) 20 MG tablet Take 1 tablet (20 mg total) by mouth at bedtime. 05/18/17  Yes Nita Sells, MD  mometasone-formoterol (DULERA) 100-5 MCG/ACT AERO Inhale 2 puffs into the lungs 2 (two) times daily. 05/18/17  Yes Nita Sells, MD  OXYGEN Inhale 3 L into the lungs.    Yes [provider]  predniSONE (DELTASONE) 20 MG tablet Take 20 mg by mouth daily with breakfast.   Yes [provider]  tamsulosin (FLOMAX) 0.4 MG CAPS capsule Take 0.4 mg by mouth daily.   Yes [provider]  tiotropium (SPIRIVA HANDIHALER) 18 MCG inhalation capsule Place 1 capsule (18 mcg total) into inhaler and inhale daily. 08/29/17  Yes Mesner, Corene Cornea, MD  metFORMIN (GLUCOPHAGE) 500 MG tablet Take 1 tablet (500 mg total) by mouth 2 (two) times daily with a meal. Patient not taking: Reported on 09/14/2017 09/07/17   Edwin Dada, MD  predniSONE (DELTASONE) 10 MG tablet Take 1 tablet (10 mg total) by mouth daily. Patient not taking: Reported on 09/14/2017  09/07/17   DanfordSuann Larry, MD    Family History Family History  Problem Relation Age of Onset  . Heart disease Father   . Lung cancer Maternal Uncle        smoked    Social History Social History   Tobacco Use  . Smoking status: Former Smoker    Packs/day: 0.10    Years: 46.00    Pack years: 4.60    Types: Cigarettes    Last attempt to quit: 05/21/2017    Years since quitting: 0.3  . Smokeless tobacco: Former Systems developer    Types: Chew  Substance Use Topics  . Alcohol use: Not Currently    Comment: h/o use in the past, no h/o heavy use  . Drug use: No     Allergies   Codeine   Review of Systems Review of Systems  Unable to perform ROS: Severe respiratory distress     Physical Exam Updated Vital Signs BP 117/83   Pulse (!) 105   Resp (!) 23   SpO2 100%   Physical Exam  Constitutional: He appears well-developed and well-nourished. He appears distressed.  HENT:  Head: Normocephalic and atraumatic.  Mouth/Throat: Oropharynx is clear and moist. No oropharyngeal exudate.  Eyes: Pupils  are equal, round, and reactive to light. Conjunctivae and EOM are normal. Right eye exhibits no discharge. Left eye exhibits no discharge. No scleral icterus.  Neck: Normal range of motion. Neck supple. No JVD present. No thyromegaly present.  Cardiovascular: Regular rhythm, normal heart sounds and intact distal pulses. Exam reveals no gallop and no friction rub.  No murmur heard. Tachycardic to 120, some slight irregularity to the rhythm but good pulses at the radial arteries and no JVD  Pulmonary/Chest: He is in respiratory distress. He has wheezes. He has no rales.  He has decreased lung sounds in all lung fields, there are some slight inspiratory sounds but very little expiratory sounds.  He has diffuse expiratory wheezing, he has the ability to speak but in short and sentences, he is pursed lipped breathing with a prolonged expiratory phase  Abdominal: Soft. Bowel sounds are normal. He exhibits no distension and no mass. There is no tenderness.  Musculoskeletal: Normal range of motion. He exhibits no edema or tenderness.  Lymphadenopathy:    He has no cervical adenopathy.  Neurological: He is alert. Coordination normal.  Skin: Skin is warm and dry. No rash noted. No erythema.  Psychiatric: He has a normal mood and affect. His behavior is normal.  Nursing note and vitals reviewed.    ED Treatments / Results  Labs (all labs ordered are listed, but only abnormal results are displayed) Labs Reviewed  BASIC METABOLIC PANEL - Abnormal; Notable for the following components:      Result Value   Chloride 97 (*)    CO2 35 (*)    Glucose, Bld 227 (*)    Calcium 8.6 (*)    All other components within normal limits  BLOOD GAS, ARTERIAL - Abnormal; Notable for the following components:   pCO2 arterial 63.0 (*)    pO2, Arterial 74.3 (*)    Bicarbonate 37.5 (*)    Acid-Base Excess 10.3 (*)    All other components within normal limits  CBC WITH DIFFERENTIAL/PLATELET    EKG EKG  Interpretation  Date/Time:  Sunday September 14 2017 14:38:17 EDT Ventricular Rate:  112 PR Interval:    QRS Duration: 92 QT Interval:  319 QTC Calculation: 436 R Axis:   59  Text Interpretation:  Sinus tachycardia Consider right atrial enlargement Anterior infarct, old similar to previous from 09/14/17 Confirmed by Virgel Manifold [id number]) on 09/14/2017 4:08:59 PM   Radiology Dg Chest Port 1 View  Result Date: 09/14/2017 CLINICAL DATA:  Cough and dyspnea EXAM: PORTABLE CHEST 1 VIEW COMPARISON:  09/10/2017 chest radiograph. FINDINGS: Stable cardiomediastinal silhouette with normal heart size. No pneumothorax. No pleural effusion. Hyperinflated lungs. Emphysema. No pulmonary edema. No acute consolidative airspace disease. Healed deformities in multiple posterior right ribs. IMPRESSION: 1. No acute cardiopulmonary disease. 2. Hyperinflated lungs and emphysema, suggesting COPD. Electronically Signed   By: Ilona Sorrel M.D.   On: 09/14/2017 14:23    Procedures Procedures (including critical care time)  Medications Ordered in ED Medications  albuterol (PROVENTIL,VENTOLIN) solution continuous neb (15 mg/hr Nebulization Given 09/14/17 1452)  methylPREDNISolone sodium succinate (SOLU-MEDROL) 125 mg/2 mL injection 125 mg (125 mg Intravenous Given 09/14/17 1421)  LORazepam (ATIVAN) tablet 1 mg (1 mg Oral Given 09/14/17 1437)     Initial Impression / Assessment and Plan / ED Course  I have reviewed the triage vital signs and the nursing notes.  Pertinent labs & imaging results that were available during my care of the patient were reviewed by me and considered in my medical decision making (see chart for details).  Clinical Course as of Sep 15 1610  Nancy Fetter Sep 14, 2017  1450 I have personally viewed the portable anterior posterior chest x-ray that was done in the room.  There is no signs of infiltrate or pneumothorax, there does appear to be hyperexpanded lungs consistent with COPD.  The patient is asking  for Ativan to help with his anxiety.  1 mg p.o. was given.  White blood cell count normal, ABG pending   [BM]  1517 ABG shows persistent mild to moderate hypercapnia with a normal pH suggestive of a respiratory acidosis with metabolic compensation.   [BM]    Clinical Course User Index [BM] Noemi Chapel, MD    The patient's pathology has been questioned in the past however on my exam he definitely has an exam consistent with severe COPD and obstructive lung disease.  We will obtain an ABG, chest x-ray,Continue his nebulizer therapy for over an hour and repeat evaluation.  Review of prior labs show that he has had some hypercapnic respiratory failure based on prior venous blood gases from 10 August.  Patient does appear critically ill with his increased work of breathing and distress.  Improved after nebs, Ativan given as well, ABG consistent with respiratory acidosis, compensated.  He no longer appears to have severe increased work of breathing, he still has expiratory wheezing but he now has much improved lung sounds and you can now hear expiratory sounds much better.  He is now speaking in full sentences. 4:12 PM   Discussed with oncoming physician at change of shift, Dr. Wilson Singer will follow up ambulatory trial and disposition as per patient's improvement or worsening of symptoms.  Final Clinical Impressions(s) / ED Diagnoses   Final diagnoses:  COPD exacerbation [facility])    ED Discharge Orders    None       Noemi Chapel, MD 09/14/17 [phone]

## 2017-09-14 NOTE — ED Notes (Signed)
Bed: WA01 Expected date:  Expected time:  Means of arrival:  Comments: 62 yo SOB, COPD

## 2017-09-14 NOTE — ED Triage Notes (Signed)
Per [facility]: Patient from home presents with SOB. On 3L O2 and breathing tx 3x a day and it hasn't helped his COPD. 20G L  Wrist. Received 2 duonebs and 125 solumedrol. 110/70 112 20 100 8L O2 (neb). Wheezing all lobes.

## 2017-09-14 NOTE — ED Notes (Signed)
Pt reports having difficulty breathing.  Lung sounds clear.  Pt states that when he gets predisone, he does tend to get nervous.

## 2017-09-14 NOTE — ED Notes (Signed)
Pulse Ox while ambulating stayed 93-94%, but pulse climbed to 140. Patient was extremely winded and felt faint.

## 2017-09-14 NOTE — ED Notes (Signed)
ED TO INPATIENT HANDOFF REPORT  Name/Age/Gender [name] 62 y.o. male  Code Status Code Status History    Date Active Date Inactive Code Status Order ID Comments User Context   09/11/2017 0045 09/12/2017 1527 Full Code [id number]  Etta Quill, DO ED   09/04/2017 2324 09/07/2017 2203 Full Code [id number]  Vianne Bulls, MD ED   08/31/2017 0014 09/02/2017 0026 Full Code [id number]  Katherine Roan, MD ED   08/24/2017 2059 08/27/2017 0325 Partial Code [id number]  Kathi Ludwig, MD ED   06/13/2017 0336 06/18/2017 0200 Partial Code [id number]  Rise Patience, MD ED   06/08/2017 0454 06/09/2017 1757 Partial Code [id number]  [name], MD ED   05/29/2017 0020 06/02/2017 1608 Partial Code [id number]  Vianne Bulls, MD ED   05/21/2017 0252 05/27/2017 0006 Partial Code [id number]  Rise Patience, MD ED   05/13/2017 1145 05/18/2017 1405 Partial Code [id number]  Karmen Bongo, MD ED   05/13/2017 0948 05/13/2017 1145 Full Code [id number]  Samella Parr, NP ED   04/28/2017 0339 05/01/2017 1920 Full Code [id number]  Rise Patience, MD ED   04/18/2017 1223 04/19/2017 1723 Full Code [id number]  Georgette Shell, MD ED   04/06/2017 0022 04/09/2017 2041 Full Code [id number]  Norval Morton, MD ED   03/28/2017 0252 03/28/2017 2203 Full Code [id number]  Shanon Rosser, MD ED   03/24/2017 0036 03/27/2017 1538 Full Code [id number]  Norval Morton, MD ED   10/22/2016 2241 10/24/2016 1901 DNR [id number]  Karmen Bongo, MD ED   01/09/2013 2341 01/22/2013 1959 Full Code [id number]  Toy Baker, MD Inpatient   02/03/2012 1704 02/06/2012 1626 Full Code [id number]  Stacie Glaze, RN ED      Home/SNF/Other Home  Chief Complaint resp distress  Level of Care/Admitting Diagnosis ED Disposition    ED Disposition Condition [facility] Area: [facility] [[id number]]  Level of Care: Telemetry [5]  Admit to tele based on following criteria: Complex arrhythmia  (Bradycardia/Tachycardia)  Diagnosis: COPD exacerbation [facility]) [[id number]]  Admitting Physician: Mariel Aloe [phone]  Attending Physician: Mariel Aloe [phone]  PT Class (Do Not Modify): Observation [104]  PT Acc Code (Do Not Modify): Observation [10022]       Medical History Past Medical History:  Diagnosis Date  . Asthma   . Bipolar 1 disorder ([place]) 02/05/2012  . Colon cancer ([place]) 04/11/11   adenocarcinoma of colon, 7/19 nodes pos.FINISHED CHEMO/DR. SHERRILL  . COPD (chronic obstructive pulmonary disease) ([place])    SMOKER  . Depression   . Emphysema of lung ([place])   . Full dentures   . Hemorrhoids   . On home oxygen therapy    "3L; 24/7" (05/29/2017)  . Pneumonia ~ 2016   "double pneumonia"  . Prostate cancer ([place])    Gleason score = 7, supposed to have radiation therapy but he has not followed up (05/29/2017)  . Rib fractures    hx of    Allergies Allergies  Allergen Reactions  . Codeine Nausea And Vomiting    IV Location/Drains/Wounds Patient Lines/Drains/Airways Status   Active Line/Drains/Airways    Name:   Placement date:   Placement time:   Site:   Days:   Peripheral IV 09/14/17 Left Wrist   09/14/17    1354    Wrist   less than 1          Labs/Imaging Results for orders placed or performed during the  hospital encounter of 09/14/17 (from the past 48 hour(s))  Basic metabolic panel     Status: Abnormal   Collection Time: 09/14/17  2:36 PM  Result Value Ref Range   Sodium 142 135 - 145 mmol/L   Potassium 4.4 3.5 - 5.1 mmol/L   Chloride 97 (L) 98 - 111 mmol/L   CO2 35 (H) 22 - 32 mmol/L   Glucose, Bld 227 (H) 70 - 99 mg/dL   BUN 19 8 - 23 mg/dL   Creatinine, Ser 0.68 0.61 - 1.24 mg/dL   Calcium 8.6 (L) 8.9 - 10.3 mg/dL   GFR calc non Af Amer >60 >60 mL/min   GFR calc Af Amer >60 >60 mL/min    Comment: (NOTE) The eGFR has been calculated using the CKD EPI equation. This calculation has not been validated in all clinical situations. eGFR's persistently  <60 mL/min signify possible Chronic Kidney Disease.    Anion gap 10 5 - 15    Comment: Performed at [facility], [place] [address], [place], [place] [address]  CBC with Differential/Platelet     Status: None   Collection Time: 09/14/17  2:36 PM  Result Value Ref Range   WBC 9.2 4.0 - 10.5 K/uL   RBC 4.47 4.22 - 5.81 MIL/uL   Hemoglobin 13.7 13.0 - 17.0 g/dL   HCT 43.2 39.0 - 52.0 %   MCV 96.6 78.0 - 100.0 fL   MCH 30.6 26.0 - 34.0 pg   MCHC 31.7 30.0 - 36.0 g/dL   RDW 12.9 11.5 - 15.5 %   Platelets 181 150 - 400 K/uL   Neutrophils Relative % 77 %   Neutro Abs 7.0 1.7 - 7.7 K/uL   Lymphocytes Relative 19 %   Lymphs Abs 1.8 0.7 - 4.0 K/uL   Monocytes Relative 4 %   Monocytes Absolute 0.4 0.1 - 1.0 K/uL   Eosinophils Relative 0 %   Eosinophils Absolute 0.0 0.0 - 0.7 K/uL   Basophils Relative 0 %   Basophils Absolute 0.0 0.0 - 0.1 K/uL    Comment: Performed at [facility], [place] [name] [place], [place] [address]  Blood gas, arterial     Status: Abnormal   Collection Time: 09/14/17  2:50 PM  Result Value Ref Range   O2 Content 3.0 L/min   Delivery systems NASAL CANNULA    pH, Arterial 7.392 7.350 - 7.450   pCO2 arterial 63.0 (H) 32.0 - 48.0 mmHg   pO2, Arterial 74.3 (L) 83.0 - 108.0 mmHg   Bicarbonate 37.5 (H) 20.0 - 28.0 mmol/L   Acid-Base Excess 10.3 (H) 0.0 - 2.0 mmol/L   O2 Saturation 95.2 %   Patient temperature 98.6    Collection site RIGHT RADIAL    Drawn by [id number]    Sample type ARTERIAL    Allens test (pass/fail) PASS PASS    Comment: Performed at [facility], [place] [address], [place], [place] [address]   Dg Chest Port 1 View  Result Date: 09/14/2017 CLINICAL DATA:  Cough and dyspnea EXAM: PORTABLE CHEST 1 VIEW COMPARISON:  09/10/2017 chest radiograph. FINDINGS: Stable cardiomediastinal silhouette with normal heart size. No pneumothorax. No pleural effusion. Hyperinflated lungs. Emphysema. No pulmonary  edema. No acute consolidative airspace disease. Healed deformities in multiple posterior right ribs. IMPRESSION: 1. No acute cardiopulmonary disease. 2. Hyperinflated lungs and emphysema, suggesting COPD. Electronically Signed   By: Ilona Sorrel M.D.   On: 09/14/2017 14:23    Pending Labs Unresulted  Labs (From admission, onward)    Start     Ordered   Signed and Held  Creatinine, serum  (enoxaparin (LOVENOX)    CrCl >/= 30 ml/min)  Weekly,   R    Comments:  while on enoxaparin therapy    Signed and Held          Vitals/Pain Today's Vitals   09/14/17 1400 09/14/17 1700 09/14/17 1730 09/14/17 1800  BP: 117/83 121/74 114/75 94/76  Pulse: (!) 105 (!) 122 84 (!) 120  Resp: (!) 23 (!) 28 (!) 34 (!) 27  SpO2: 100% 93% 94% 96%  PainSc:        Isolation Precautions No active isolations  Medications Medications  insulin aspart (novoLOG) injection 0-9 Units (has no administration in time range)  albuterol (PROVENTIL,VENTOLIN) solution continuous neb (15 mg/hr Nebulization Given 09/14/17 1452)  methylPREDNISolone sodium succinate (SOLU-MEDROL) 125 mg/2 mL injection 125 mg (125 mg Intravenous Given 09/14/17 1421)  LORazepam (ATIVAN) tablet 1 mg (1 mg Oral Given 09/14/17 1437)    Mobility walks with person assist

## 2017-09-15 DIAGNOSIS — J9611 Chronic respiratory failure with hypoxia: Secondary | ICD-10-CM | POA: Diagnosis not present

## 2017-09-15 DIAGNOSIS — J441 Chronic obstructive pulmonary disease with (acute) exacerbation: Secondary | ICD-10-CM | POA: Diagnosis not present

## 2017-09-15 DIAGNOSIS — Z515 Encounter for palliative care: Secondary | ICD-10-CM | POA: Diagnosis not present

## 2017-09-15 DIAGNOSIS — F419 Anxiety disorder, unspecified: Secondary | ICD-10-CM | POA: Diagnosis not present

## 2017-09-15 DIAGNOSIS — F1010 Alcohol abuse, uncomplicated: Secondary | ICD-10-CM | POA: Diagnosis not present

## 2017-09-15 DIAGNOSIS — Z66 Do not resuscitate: Secondary | ICD-10-CM

## 2017-09-15 LAB — GLUCOSE, CAPILLARY
GLUCOSE-CAPILLARY: 231 mg/dL — AB (ref 70–99)
GLUCOSE-CAPILLARY: 155 mg/dL — AB (ref 70–99)
Glucose-Capillary: 195 mg/dL — ABNORMAL HIGH (ref 70–99)
Glucose-Capillary: 200 mg/dL — ABNORMAL HIGH (ref 70–99)

## 2017-09-15 MED ORDER — FOLIC ACID 1 MG PO TABS
1.0000 mg | ORAL_TABLET | Freq: Every day | ORAL | Status: DC
  Administered 2017-09-15 – 2017-09-19 (×5): 1 mg via ORAL
  Filled 2017-09-15 (×5): qty 1

## 2017-09-15 MED ORDER — LORAZEPAM 1 MG PO TABS
1.0000 mg | ORAL_TABLET | Freq: Four times a day (QID) | ORAL | Status: AC | PRN
  Administered 2017-09-15 – 2017-09-18 (×4): 1 mg via ORAL
  Filled 2017-09-15 (×3): qty 1

## 2017-09-15 MED ORDER — LORAZEPAM 2 MG/ML IJ SOLN
1.0000 mg | Freq: Four times a day (QID) | INTRAMUSCULAR | Status: AC | PRN
  Filled 2017-09-15: qty 1

## 2017-09-15 MED ORDER — INSULIN ASPART 100 UNIT/ML ~~LOC~~ SOLN
0.0000 [IU] | Freq: Every day | SUBCUTANEOUS | Status: DC
  Administered 2017-09-18: 2 [IU] via SUBCUTANEOUS

## 2017-09-15 MED ORDER — LORAZEPAM 1 MG PO TABS
1.0000 mg | ORAL_TABLET | Freq: Two times a day (BID) | ORAL | Status: DC
  Administered 2017-09-15 – 2017-09-19 (×8): 1 mg via ORAL
  Filled 2017-09-15 (×9): qty 1

## 2017-09-15 MED ORDER — LEVALBUTEROL HCL 0.63 MG/3ML IN NEBU
0.6300 mg | INHALATION_SOLUTION | Freq: Four times a day (QID) | RESPIRATORY_TRACT | Status: DC | PRN
  Filled 2017-09-15: qty 3

## 2017-09-15 MED ORDER — LEVALBUTEROL HCL 0.63 MG/3ML IN NEBU
0.6300 mg | INHALATION_SOLUTION | Freq: Four times a day (QID) | RESPIRATORY_TRACT | Status: DC
  Administered 2017-09-15 – 2017-09-19 (×17): 0.63 mg via RESPIRATORY_TRACT
  Filled 2017-09-15 (×16): qty 3

## 2017-09-15 MED ORDER — INSULIN ASPART 100 UNIT/ML ~~LOC~~ SOLN
0.0000 [IU] | Freq: Three times a day (TID) | SUBCUTANEOUS | Status: DC
  Administered 2017-09-15 (×3): 3 [IU] via SUBCUTANEOUS
  Administered 2017-09-16: 5 [IU] via SUBCUTANEOUS
  Administered 2017-09-16: 8 [IU] via SUBCUTANEOUS
  Administered 2017-09-16: 3 [IU] via SUBCUTANEOUS
  Administered 2017-09-17 (×2): 5 [IU] via SUBCUTANEOUS
  Administered 2017-09-17: 2 [IU] via SUBCUTANEOUS
  Administered 2017-09-18: 5 [IU] via SUBCUTANEOUS
  Administered 2017-09-18: 3 [IU] via SUBCUTANEOUS
  Administered 2017-09-18: 5 [IU] via SUBCUTANEOUS
  Administered 2017-09-19 (×2): 3 [IU] via SUBCUTANEOUS

## 2017-09-15 MED ORDER — ADULT MULTIVITAMIN W/MINERALS CH
1.0000 | ORAL_TABLET | Freq: Every day | ORAL | Status: DC
  Administered 2017-09-15 – 2017-09-19 (×5): 1 via ORAL
  Filled 2017-09-15 (×5): qty 1

## 2017-09-15 MED ORDER — VITAMIN B-1 100 MG PO TABS
100.0000 mg | ORAL_TABLET | Freq: Every day | ORAL | Status: DC
  Administered 2017-09-15 – 2017-09-19 (×5): 100 mg via ORAL
  Filled 2017-09-15 (×5): qty 1

## 2017-09-15 MED ORDER — THIAMINE HCL 100 MG/ML IJ SOLN: 100.0000 mg | Freq: Every day | INTRAMUSCULAR | Status: DC

## 2017-09-15 NOTE — Consult Note (Signed)
Consultation Note Date: 09/15/2017   Patient Name: Taylor [name]  DOB: 10-05-1955  MRN: [id number]  Age / Sex: 62 y.o., male  PCP: Taylor [name] Referring Physician: Mariel Aloe, MD  Reason for Consultation: Establishing goals of [name] and Psychosocial/spiritual support  HPI/Patient Profile: 62 y.o. male   admitted on 09/14/2017 with  62 y.o. male with medical history significant of COPD, alcohol abuse, anxiety, depression, hyperlipidemia, tobacco abuse.  Patient presented secondary to worsening dyspnea.    Patient was just discharged from the hospital 2 days ago.  He tried using his albuterol inhaler and nebulizer in addition to [facility] with no improvement of his symptoms.    Patient has had many rehospitalizations in the last  6 months secondary to similar episodes.  According to his sister the patient's mother died 5 years ago, she was the main caregiver and kept things coordinated.  The patient has not been thriving since her death.    Patient has been in and out of boarding house but tells me he cannot live there with "his problems"    Severe COPD.  He is a history of colon cancer that was treated under Dr. Gearldine [name] [name].  According to the patient he was diagnosed with prostate cancer somewhere in [place] where he was incarcerated several years ago and it has been untreated and not addressed since then.  He "just wants help getting into a facility where he can get what he needs to be better"    He faces difficult situations and according to his sister does not have the mental capacity or social skills to advocate for himself.    Difficult psychosocial/medical situation.  Clinical Assessment and Goals of [name]:  This NP Taylor [name] reviewed medical records, received report from team, assessed the patient and then meet at the patient's bedside  to discuss diagnosis,  prognosis, GOC, EOL wishes disposition and options.   Concept of Palliative [name] was discussed   I also spoke to the patient's sister by phone.  She tries to help the patient is much as she can but has her own health issues and immediate  family members that need her attention.  Did give me the number of the Medicaid worker who she has been trying to work with in an attempt to secure an assisted living for her brother  [facility] case manager Taylor [name] phone [phone]  A  discussion was had today regarding advanced directives.    The difference between a aggressive medical intervention path  and a palliative comfort [name] path for this patient at this time was had.  Values and goals of [name] important to patient and family were attempted to be elicited.    Questions and concerns addressed.   Family encouraged to call with questions or concerns.    PMT will continue to support holistically.   NEXT OF KIN/ Taylor [name] is the patient only support and living family    SUMMARY OF RECOMMENDATIONS    Code Status/Advance [name]  Planning:  DNR   Psycho-social/Spiritual:   Desire for further Chaplaincy support:   yes  Additional Recommendations: Spoke with both case management and social work today regarding the situation.    Prognosis:   Unable to determine  Discharge Planning: To Be Determined   Clearly patient has been unsuccessful in the outpatient world  and handling his multiple comorbidities.  Hopefully we can look into an assist and possible assisted living placement.       Primary Diagnoses: Present on Admission: . COPD exacerbation ([place]) . Alcohol abuse . Anxiety and depression   I have reviewed the medical record, interviewed the patient and family, and examined the patient. The following aspects are pertinent.  Past Medical History:  Diagnosis Date  . Asthma   . Bipolar 1 disorder ([place]) 02/05/2012  . Colon cancer ([place]) 04/11/11   adenocarcinoma  of colon, 7/19 nodes pos.FINISHED CHEMO/DR. SHERRILL  . COPD (chronic obstructive pulmonary disease) ([place])    SMOKER  . Depression   . Emphysema of lung ([place])   . Full dentures   . Hemorrhoids   . On home oxygen therapy    "3L; 24/7" (05/29/2017)  . Pneumonia ~ 2016   "double pneumonia"  . Prostate cancer ([place])    Gleason score = 7, supposed to have radiation therapy but he has not followed up (05/29/2017)  . Rib fractures    hx of   Social History   Socioeconomic History  . Marital status: Divorced    Spouse name: Not on file  . Number of children: 1  . Years of education: Not on file  . Highest education level: Not on file  Occupational History  . Occupation: disability pending  Social Needs  . Financial resource strain: Not hard at all  . Food insecurity:    Worry: Never true    Inability: Never true  . Transportation needs:    Medical: Yes    Non-medical: Yes  Tobacco Use  . Smoking status: Former Smoker    Packs/day: 0.10    Years: 46.00    Pack years: 4.60    Types: Cigarettes    Last attempt to quit: 05/21/2017    Years since quitting: 0.3  . Smokeless tobacco: Former Systems developer    Types: Chew  Substance and Sexual Activity  . Alcohol use: Not Currently    Comment: h/o use in the past, no h/o heavy use  . Drug use: No  . Sexual activity: Not Currently  Lifestyle  . Physical activity:    Days per week: 0 days    Minutes per session: 0 min  . Stress: Very much  Relationships  . Social connections:    Talks on phone: More than three times a week    Gets together: Never    Attends religious service: Never    Active member of club or organization: No    Attends meetings of clubs or organizations: Never    Relationship status: Divorced  Other Topics Concern  . Not on file  Social History Narrative   Lives alone-divorced, disabled, in [place] 1970's   Brother, Taylor [name] and his wife Taylor [name] assist   Prior occupation: Clinical biochemist   Family History  Problem  Relation Age of Onset  . Heart disease Father   . Lung cancer Maternal Uncle        smoked   Scheduled Meds: . ARIPiprazole  5 mg Oral Q breakfast  . enoxaparin (LOVENOX) injection  40 mg Subcutaneous Q24H  .  escitalopram  10 mg Oral Daily  . folic acid  1 mg Oral Daily  . insulin aspart  0-15 Units Subcutaneous TID WC  . insulin aspart  0-5 Units Subcutaneous QHS  . levalbuterol  0.63 mg Nebulization QID  . LORazepam  1 mg Oral BID  . mometasone-formoterol  2 puff Inhalation BID  . multivitamin with minerals  1 tablet Oral Daily  . pravastatin  20 mg Oral q1800  . predniSONE  20 mg Oral Q breakfast  . tamsulosin  0.4 mg Oral Daily  . thiamine  100 mg Oral Daily   Or  . thiamine  100 mg Intravenous Daily  . tiotropium  18 mcg Inhalation Daily   Continuous Infusions: PRN Meds:.acetaminophen **OR** acetaminophen, LORazepam **OR** LORazepam, ondansetron **OR** ondansetron (ZOFRAN) IV, polyethylene glycol Medications Prior to Admission:  Prior to Admission medications   Medication Sig Start Date End Date Taking? Authorizing Provider  albuterol (PROVENTIL HFA;VENTOLIN HFA) 108 (90 Base) MCG/ACT inhaler Inhale 2 puffs into the lungs every 6 (six) hours as needed for wheezing or shortness of breath. 08/26/17  Yes Isabelle Course, MD  albuterol (PROVENTIL) (5 MG/ML) 0.5% nebulizer solution Take 1 mL (5 mg total) by nebulization every 6 (six) hours as needed for wheezing or shortness of breath. 09/07/17  Yes Danford, Suann Larry, MD  ALPRAZolam Duanne Moron) 0.5 MG tablet Take 1 tablet (0.5 mg total) by mouth 3 (three) times daily as needed for anxiety. 09/12/17  Yes Purohit, Konrad Dolores, MD  ARIPiprazole (ABILIFY) 5 MG tablet Take 1 tablet (5 mg total) by mouth daily with breakfast. 08/27/17  Yes Isabelle Course, MD  blood glucose meter kit and supplies KIT Dispense based on patient and insurance preference. Use up to four times daily as directed. (FOR ICD-9 250.00, 250.01). 09/07/17  Yes Danford,  Suann Larry, MD  escitalopram (LEXAPRO) 10 MG tablet Take 1 tablet (10 mg total) by mouth daily. 08/27/17  Yes Isabelle Course, MD  lovastatin (MEVACOR) 20 MG tablet Take 1 tablet (20 mg total) by mouth at bedtime. 05/18/17  Yes Nita Sells, MD  mometasone-formoterol (DULERA) 100-5 MCG/ACT AERO Inhale 2 puffs into the lungs 2 (two) times daily. 05/18/17  Yes Nita Sells, MD  OXYGEN Inhale 3 L into the lungs.    Yes [provider]  predniSONE (DELTASONE) 20 MG tablet Take 20 mg by mouth daily with breakfast.   Yes [provider]  tamsulosin (FLOMAX) 0.4 MG CAPS capsule Take 0.4 mg by mouth daily.   Yes [provider]  tiotropium (SPIRIVA HANDIHALER) 18 MCG inhalation capsule Place 1 capsule (18 mcg total) into inhaler and inhale daily. 08/29/17  Yes Mesner, Corene Cornea, MD  metFORMIN (GLUCOPHAGE) 500 MG tablet Take 1 tablet (500 mg total) by mouth 2 (two) times daily with a meal. Patient not taking: Reported on 09/14/2017 09/07/17   Edwin Dada, MD  predniSONE (DELTASONE) 10 MG tablet Take 1 tablet (10 mg total) by mouth daily. Patient not taking: Reported on 09/14/2017 09/07/17   Edwin Dada, MD   Allergies  Allergen Reactions  . Codeine Nausea And Vomiting   Review of Systems  Respiratory: Positive for shortness of breath.   Neurological: Positive for weakness.    Physical Exam  Constitutional: He is oriented to person, place, and time. He appears well-developed.  Cardiovascular: Normal rate, regular rhythm and normal heart sounds.  Pulmonary/Chest: Effort normal and breath sounds normal.  Neurological: He is alert and oriented to person, place, and  time.  Skin: Skin is warm and dry.    Vital Signs: BP 116/81 (BP Location: Left Arm)   Pulse 79   Temp 98.2 F (36.8 C) (Oral)   Resp 18   Ht '5\' 11"'  (1.803 m)   Wt 70.4 kg   SpO2 95%   BMI 21.65 kg/m  Pain Scale: 0-10   Pain Score: 0-No pain   SpO2: SpO2: 95 % O2  Device:SpO2: 95 % O2 Flow Rate: .O2 Flow Rate (L/min): 3 L/min  IO: Intake/output summary: No intake or output data in the 24 hours ending 09/15/17 1041  LBM:   Baseline Weight: Weight: 70.4 kg Most recent weight: Weight: 70.4 kg     Palliative Assessment/Data:   Discussed with SW/Megan  Time In: 1045 Time Out: 1200 Time Total: 75 minutes Greater than 50%  of this time was spent counseling and coordinating [name] related to the above assessment and plan.  Signed by: Taylor Lessen, NP   Please contact Palliative Medicine Team phone at [phone] for questions and concerns.  For individual provider: See Shea Evans

## 2017-09-15 NOTE — Progress Notes (Signed)
Pt is having moderate tremors and c/o feeling anxious. CIWA 7. Due to pt's current state, PRN Ativan given. Advised to call for assistance when up OOB. Eulas Post, RN

## 2017-09-15 NOTE — Progress Notes (Signed)
[place] Cassidy RN and I went to speak with patient and her husband in regards to appealing her discharge. Information given to patient and husband on proper procedure to appeal discharge. Hospital ID number given to pt and husband. Requested husband to provide [place] with number that he was given by Medicare after his phone call to them. Husband was argumentative about process and explained to [place] and I that he was well aware of what he was doing and the proper process.

## 2017-09-15 NOTE — Progress Notes (Signed)
PROGRESS NOTE    Taylor [name]  UXL:[id number] DOB: Feb 25, 1955 DOA: 09/14/2017 PCP: [facility]   Brief Narrative: Taylor [name] is a 62 y.o. male with medical history significant of COPD, alcohol abuse, anxiety, depression, hyperlipidemia, tobacco abuse. Patient presented secondary to shortness of breath. He has had, including this admission, 14 admissions in the last 6 months for similar presentation. He was treated for a COPD exacerbation and had significant tachycardia after receiving albuterol treatment. Functionally very poor tolerance.    Assessment & Plan:   Active Problems:   Alcohol abuse   COPD exacerbation (HCC)   Anxiety and depression   Chronic respiratory failure with hypoxia and hypercapnia (HCC)   COPD exacerbation Still with significant dyspnea overnight. -Switch to Xopenex QID -Continue Spiriva and Dulera -Continue prednisone -PT eval -Palliative care recommendations  Chronic respiratory failure with hypoxia and hypercapnia Stable.  Anxiety/depression -Will switch to scheduled Ativan BID  Alcohol abuse -CIWA  Diabetes mellitus Uncontrolled with hyperglycemia. Worsened with IV steroids -SSI moderate   DVT prophylaxis: Lovenox Code Status:   Code Status: DNR Family Communication: None at bedside Disposition Plan: Pending improvement of breathing and goals of care   Consultants:   Palliative care  Procedures:   None  Antimicrobials:  None    Subjective: Shortness of breath  Objective: Vitals:   09/14/17 2030 09/14/17 2139 09/15/17 0553 09/15/17 0619  BP:   116/81   Pulse:   79   Resp:   18   Temp:   98.2 F (36.8 C)   TempSrc:   Oral   SpO2:  95% 100%   Weight:    70.4 kg  Height: 5\' 11"  (1.803 m)      No intake or output data in the 24 hours ending 09/15/17 0815 Filed Weights   09/15/17 0619  Weight: 70.4 kg    Examination:  General exam: Appears calm and comfortable Respiratory  system: No wheezing, however, poor air movement. Better than yesterday, however. Respiratory effort normal. Cardiovascular system: S1 & S2 heard, tachycardia, regular rhythm. No murmurs, rubs, gallops or clicks. Gastrointestinal system: Abdomen is nondistended, soft and nontender. No organomegaly or masses felt. Normal bowel sounds heard. Central nervous system: Alert and oriented. No focal neurological deficits. Extremities: No edema. No calf tenderness Skin: No cyanosis. No rashes Psychiatry: Judgement and insight appear normal. Anxious.    Data Reviewed: I have personally reviewed following labs and imaging studies  CBC: Recent Labs  Lab 09/10/17 2214 09/14/17 1436  WBC 14.5* 9.2  NEUTROABS 10.7* 7.0  HGB 14.8 13.7  HCT 47.0 43.2  MCV 97.1 96.6  PLT 240 536   Basic Metabolic Panel: Recent Labs  Lab 09/10/17 2214 09/14/17 1436  NA 141 142  K 4.2 4.4  CL 93* 97*  CO2 40* 35*  GLUCOSE 241* 227*  BUN 13 19  CREATININE 0.87 0.68  CALCIUM 8.8* 8.6*   GFR: Estimated Creatinine Clearance: 95.3 mL/min (by C-G formula based on SCr of 0.68 mg/dL). Liver Function Tests: Recent Labs  Lab 09/10/17 2214  AST 18  ALT 24  ALKPHOS 61  BILITOT 0.6  PROT 6.0*  ALBUMIN 3.9   No results for input(s): LIPASE, AMYLASE in the last 168 hours. No results for input(s): AMMONIA in the last 168 hours. Coagulation Profile: No results for input(s): INR, PROTIME in the last 168 hours. Cardiac Enzymes: Recent Labs  Lab 09/10/17 2214  TROPONINI <0.03   BNP (last 3 results) No results for  input(s): PROBNP in the last 8760 hours. HbA1C: No results for input(s): HGBA1C in the last 72 hours. CBG: Recent Labs  Lab 09/12/17 0730 09/12/17 1203 09/14/17 1843 09/14/17 2009 09/14/17 2255  GLUCAP 138* 199* 380* 452* 303*   Lipid Profile: No results for input(s): CHOL, HDL, LDLCALC, TRIG, CHOLHDL, LDLDIRECT in the last 72 hours. Thyroid Function Tests: No results for input(s): TSH,  T4TOTAL, FREET4, T3FREE, THYROIDAB in the last 72 hours. Anemia Panel: No results for input(s): VITAMINB12, FOLATE, FERRITIN, TIBC, IRON, RETICCTPCT in the last 72 hours. Sepsis Labs: No results for input(s): PROCALCITON, LATICACIDVEN in the last 168 hours.  No results found for this or any previous visit (from the past 240 hour(s)).       Radiology Studies: Dg Chest Port 1 View  Result Date: 09/14/2017 CLINICAL DATA:  Cough and dyspnea EXAM: PORTABLE CHEST 1 VIEW COMPARISON:  09/10/2017 chest radiograph. FINDINGS: Stable cardiomediastinal silhouette with normal heart size. No pneumothorax. No pleural effusion. Hyperinflated lungs. Emphysema. No pulmonary edema. No acute consolidative airspace disease. Healed deformities in multiple posterior right ribs. IMPRESSION: 1. No acute cardiopulmonary disease. 2. Hyperinflated lungs and emphysema, suggesting COPD. Electronically Signed   By: Ilona Sorrel M.D.   On: 09/14/2017 14:23        Scheduled Meds: . ARIPiprazole  5 mg Oral Q breakfast  . enoxaparin (LOVENOX) injection  40 mg Subcutaneous Q24H  . escitalopram  10 mg Oral Daily  . insulin aspart  0-15 Units Subcutaneous TID WC  . insulin aspart  0-5 Units Subcutaneous QHS  . LORazepam  1 mg Oral BID  . mometasone-formoterol  2 puff Inhalation BID  . pravastatin  20 mg Oral q1800  . predniSONE  20 mg Oral Q breakfast  . tamsulosin  0.4 mg Oral Daily  . tiotropium  18 mcg Inhalation Daily   Continuous Infusions:   LOS: 0 days     Cordelia Poche, MD [facility] 09/15/2017, 8:15 AM Pager: [phone]  If 7PM-7AM, please contact night-coverage [email or web address] 09/15/2017, 8:15 AM

## 2017-09-15 NOTE — Plan of Care (Signed)
Pt is taking Ativan when it is offered.

## 2017-09-15 NOTE — Evaluation (Signed)
Physical Therapy Evaluation Patient Details Name: Taylor [name] MRN: [id number] DOB: 01-08-1956 Today's Date: 09/15/2017   History of Present Illness  Pt is a 62 y/o male admitted for COPD exacerbation. PMH includes COPD on 3L of oxygen, bipolar disorder, prostate cancer, DM, colon cancer, alcohol abuse, anxiety, depression, hyperlipidemia, tobacco abuse  Clinical Impression  Pt admitted with above diagnosis. Pt currently with functional limitations due to the deficits listed below (see PT Problem List).  Pt will benefit from skilled PT to increase their independence and safety with mobility to allow discharge to the venue listed below.  Pt found on toilet in the bathroom (RN notified pt up in room alone) on arrival.  Pt assisted with ambulating back to bed however reports fatigue and dyspnea limiting any further mobility.  SPO2 92% on 3L O2 [place] after ambulating back to bed and HR elevated to 135 bpm.  Pt fell asleep quickly upon return to supine.  Uncertain of d/c plan at this time however, if possible, pt may benefit from HHPT due to multiple readmissions.     Follow Up Recommendations Home health PT;Other (comment)(unknown d/c plan)    Equipment Recommendations  None recommended by PT    Recommendations for Other Services       Precautions / Restrictions Precautions Precautions: Fall      Mobility  Bed Mobility Overal bed mobility: Modified Independent                Transfers Overall transfer level: Needs assistance Equipment used: None Transfers: Sit to/from Stand Sit to Stand: Min guard         General transfer comment: min/guard for safety, pt found on toilet in bathroom alone, dyspnea present however SPO2 93% on 3L O2 [place]  Ambulation/Gait Ambulation/Gait assistance: Min guard Gait Distance (Feet): 8 Feet Assistive device: None Gait Pattern/deviations: Step-through pattern;Decreased stride length     General Gait Details: SPO2 92% on 3L O2 [place], HR  increased to 135 bpm, pt only able to ambulate back to bed, declined further ambulation due to dyspnea  Stairs            Wheelchair Mobility    Modified Rankin (Stroke Patients Only)       Balance Overall balance assessment: Needs assistance         Standing balance support: No upper extremity supported Standing balance-Leahy Scale: Good                               Pertinent Vitals/Pain Pain Assessment: No/denies pain    Home Living Family/patient expects to be discharged to:: Unsure                 Additional Comments: Pt was living in boarding house, per RN, not accepted back    Prior Function Level of Independence: Independent         Comments: 3L home O2. Limited to short ambulation distance secondary to SOB     Hand Dominance        Extremity/Trunk Assessment        Lower Extremity Assessment Lower Extremity Assessment: Generalized weakness    Cervical / Trunk Assessment Cervical / Trunk Assessment: Other exceptions Cervical / Trunk Exceptions: rounded shoulders and forward head.   Communication   Communication: No difficulties  Cognition Arousal/Alertness: Awake/alert Behavior During Therapy: WFL for tasks assessed/performed Overall Cognitive Status: No family/caregiver present to determine baseline cognitive functioning  General Comments: pt given Ativan earlier per RN, pt up in bathroom on arrival alone (RN states he was told to call for assist for safety)      General Comments      Exercises     Assessment/Plan    PT Assessment Patient needs continued PT services  PT Problem List Cardiopulmonary status limiting activity;Decreased activity tolerance;Decreased mobility;Decreased strength       PT Treatment Interventions DME instruction;Gait training;Functional mobility training;Therapeutic activities;Balance training;Therapeutic exercise;Patient/family education     PT Goals (Current goals can be found in the Care Plan section)  Acute Rehab PT Goals PT Goal Formulation: With patient Time For Goal Achievement: 09/22/17 Potential to Achieve Goals: Fair    Frequency Min 3X/week   Barriers to discharge        Co-evaluation               AM-PAC PT "6 Clicks" Daily Activity  Outcome Measure Difficulty turning over in bed (including adjusting bedclothes, sheets and blankets)?: None Difficulty moving from lying on back to sitting on the side of the bed? : None Difficulty sitting down on and standing up from a chair with arms (e.g., wheelchair, bedside commode, etc,.)?: None Help needed moving to and from a bed to chair (including a wheelchair)?: A Little Help needed walking in hospital room?: A Little Help needed climbing 3-5 steps with a railing? : A Little 6 Click Score: 21    End of Session Equipment Utilized During Treatment: Oxygen Activity Tolerance: Patient limited by fatigue Patient left: in bed;with bed alarm set;with call bell/phone within reach Nurse Communication: Mobility status(notified pt up in room alone, bed alarm set end of session) PT Visit Diagnosis: Other abnormalities of gait and mobility (R26.89)    Time: 1829-9371 PT Time Calculation (min) (ACUTE ONLY): 9 min   Charges:   PT Evaluation $PT Eval Low Complexity: 1 Low         Carmelia Bake, PT, DPT 09/15/2017 Pager: [phone]   [name] 09/15/2017, 3:03 PM

## 2017-09-15 NOTE — Progress Notes (Signed)
PT Cancellation Note  Patient Details Name: Taylor [name] MRN: [id number] DOB: [birth year]   Cancelled Treatment:    Reason Eval/Treat Not Completed: Fatigue/lethargy limiting ability to participate Pt siting in recliner at sink attempting to prepare for "bird bath" however with dyspnea.  SpO2 95% on 3L O2 [place].  Requested PT to check back later.  Therapist requested NT set up bath for pt to conserve energy at this time.  Will check back as schedule permits.   Roxi Hlavaty,KATHrine E 09/15/2017, 11:44 AM  Carmelia Bake, PT, DPT 09/15/2017 Pager: [phone]

## 2017-09-16 DIAGNOSIS — J9612 Chronic respiratory failure with hypercapnia: Secondary | ICD-10-CM | POA: Diagnosis not present

## 2017-09-16 DIAGNOSIS — J441 Chronic obstructive pulmonary disease with (acute) exacerbation: Secondary | ICD-10-CM | POA: Diagnosis present

## 2017-09-16 DIAGNOSIS — Z515 Encounter for palliative care: Secondary | ICD-10-CM | POA: Diagnosis present

## 2017-09-16 DIAGNOSIS — Z7951 Long term (current) use of inhaled steroids: Secondary | ICD-10-CM | POA: Diagnosis not present

## 2017-09-16 DIAGNOSIS — F17210 Nicotine dependence, cigarettes, uncomplicated: Secondary | ICD-10-CM | POA: Diagnosis present

## 2017-09-16 DIAGNOSIS — Z9981 Dependence on supplemental oxygen: Secondary | ICD-10-CM | POA: Diagnosis not present

## 2017-09-16 DIAGNOSIS — F419 Anxiety disorder, unspecified: Secondary | ICD-10-CM | POA: Diagnosis present

## 2017-09-16 DIAGNOSIS — E1165 Type 2 diabetes mellitus with hyperglycemia: Secondary | ICD-10-CM | POA: Diagnosis not present

## 2017-09-16 DIAGNOSIS — Z85038 Personal history of other malignant neoplasm of large intestine: Secondary | ICD-10-CM | POA: Diagnosis not present

## 2017-09-16 DIAGNOSIS — J9622 Acute and chronic respiratory failure with hypercapnia: Secondary | ICD-10-CM | POA: Diagnosis present

## 2017-09-16 DIAGNOSIS — E785 Hyperlipidemia, unspecified: Secondary | ICD-10-CM | POA: Diagnosis present

## 2017-09-16 DIAGNOSIS — Z8546 Personal history of malignant neoplasm of prostate: Secondary | ICD-10-CM | POA: Diagnosis not present

## 2017-09-16 DIAGNOSIS — Z7984 Long term (current) use of oral hypoglycemic drugs: Secondary | ICD-10-CM | POA: Diagnosis not present

## 2017-09-16 DIAGNOSIS — F319 Bipolar disorder, unspecified: Secondary | ICD-10-CM | POA: Diagnosis present

## 2017-09-16 DIAGNOSIS — F1010 Alcohol abuse, uncomplicated: Secondary | ICD-10-CM | POA: Diagnosis present

## 2017-09-16 DIAGNOSIS — J9611 Chronic respiratory failure with hypoxia: Secondary | ICD-10-CM | POA: Diagnosis not present

## 2017-09-16 DIAGNOSIS — F329 Major depressive disorder, single episode, unspecified: Secondary | ICD-10-CM | POA: Diagnosis not present

## 2017-09-16 DIAGNOSIS — Z66 Do not resuscitate: Secondary | ICD-10-CM | POA: Diagnosis present

## 2017-09-16 DIAGNOSIS — Z79899 Other long term (current) drug therapy: Secondary | ICD-10-CM | POA: Diagnosis not present

## 2017-09-16 DIAGNOSIS — J9621 Acute and chronic respiratory failure with hypoxia: Secondary | ICD-10-CM | POA: Diagnosis present

## 2017-09-16 LAB — GLUCOSE, CAPILLARY
GLUCOSE-CAPILLARY: 232 mg/dL — AB (ref 70–99)
GLUCOSE-CAPILLARY: 261 mg/dL — AB (ref 70–99)
Glucose-Capillary: 181 mg/dL — ABNORMAL HIGH (ref 70–99)

## 2017-09-16 NOTE — Progress Notes (Signed)
PROGRESS NOTE    Taylor [name]  JWJ:[id number] DOB: [birth year] DOA: 09/14/2017 PCP: [facility]   Brief Narrative: Taylor [name] is a 62 y.o. male with medical history significant of COPD, alcohol abuse, anxiety, depression, hyperlipidemia, tobacco abuse. Patient presented secondary to shortness of breath. He has had, including this admission, 14 admissions in the last 6 months for similar presentation. He was treated for a COPD exacerbation and had significant tachycardia after receiving albuterol treatment. Functionally very poor tolerance.    Assessment & Plan:   Active Problems:   Alcohol abuse   COPD exacerbation (HCC)   Anxiety and depression   Chronic respiratory failure with hypoxia and hypercapnia ([place])   Palliative care by specialist   DNR (do not resuscitate)   COPD exacerbation Improved today. Refused PT yesterday. PT pending today -Continue to Xopenex -Continue Spiriva and Dulera -Continue prednisone -PT eval; ambulate with oxygen  Chronic respiratory failure with hypoxia and hypercapnia Stable.  Anxiety/depression Appears to be better controlled on current regimen. -Continue scheduled Ativan BID  Alcohol abuse Scores low to moderate. -CIWA  Diabetes mellitus Uncontrolled with hyperglycemia. Worsened with IV steroids -SSI moderate   DVT prophylaxis: Lovenox Code Status:   Code Status: DNR Family Communication: None at bedside Disposition Plan: Discharge difficult as patient is on chronic O2 and no current place to discharge. CSW consulted for disposition help.   Consultants:   Palliative care  Procedures:   None  Antimicrobials:  None    Subjective: Breathing a little better today.  Objective: Vitals:   09/15/17 2114 09/16/17 0508 09/16/17 0807 09/16/17 1333  BP: 95/66 127/85  117/80  Pulse: 91 75  80  Resp: 18 17  18   Temp: 98.3 F (36.8 C) 98.6 F (37 C)  98 F (36.7 C)  TempSrc: Oral Oral   Oral  SpO2: 98% 97% 97% 99%  Weight:      Height:        Intake/Output Summary (Last 24 hours) at 09/16/2017 1552 Last data filed at 09/16/2017 1200 Gross per 24 hour  Intake 360 ml  Output 3125 ml  Net -2765 ml   Filed Weights   09/15/17 0619  Weight: 70.4 kg    Examination:  General exam: Appears calm and comfortable Respiratory system: Significantly diminished breath sounds with no wheezing. Respiratory effort normal. Cardiovascular system: S1 & S2 heard, RRR. No murmurs, rubs, gallops or clicks. Gastrointestinal system: Abdomen is nondistended, soft and nontender. Normal bowel sounds heard. Central nervous system: Alert and oriented. No focal neurological deficits. Extremities: No edema. No calf tenderness Skin: No cyanosis. No rashes Psychiatry: Judgement and insight appear normal. Mood & affect appropriate.     Data Reviewed: I have personally reviewed following labs and imaging studies  CBC: Recent Labs  Lab 09/10/17 2214 09/14/17 1436  WBC 14.5* 9.2  NEUTROABS 10.7* 7.0  HGB 14.8 13.7  HCT 47.0 43.2  MCV 97.1 96.6  PLT 240 621   Basic Metabolic Panel: Recent Labs  Lab 09/10/17 2214 09/14/17 1436  NA 141 142  K 4.2 4.4  CL 93* 97*  CO2 40* 35*  GLUCOSE 241* 227*  BUN 13 19  CREATININE 0.87 0.68  CALCIUM 8.8* 8.6*   GFR: Estimated Creatinine Clearance: 95.3 mL/min (by C-G formula based on SCr of 0.68 mg/dL). Liver Function Tests: Recent Labs  Lab 09/10/17 2214  AST 18  ALT 24  ALKPHOS 61  BILITOT 0.6  PROT 6.0*  ALBUMIN 3.9  No results for input(s): LIPASE, AMYLASE in the last 168 hours. No results for input(s): AMMONIA in the last 168 hours. Coagulation Profile: No results for input(s): INR, PROTIME in the last 168 hours. Cardiac Enzymes: Recent Labs  Lab 09/10/17 2214  TROPONINI <0.03   BNP (last 3 results) No results for input(s): PROBNP in the last 8760 hours. HbA1C: No results for input(s): HGBA1C in the last 72  hours. CBG: Recent Labs  Lab 09/15/17 1220 09/15/17 1639 09/15/17 2116 09/16/17 0734 09/16/17 1200  GLUCAP 155* 195* 200* 232* 181*   Lipid Profile: No results for input(s): CHOL, HDL, LDLCALC, TRIG, CHOLHDL, LDLDIRECT in the last 72 hours. Thyroid Function Tests: No results for input(s): TSH, T4TOTAL, FREET4, T3FREE, THYROIDAB in the last 72 hours. Anemia Panel: No results for input(s): VITAMINB12, FOLATE, FERRITIN, TIBC, IRON, RETICCTPCT in the last 72 hours. Sepsis Labs: No results for input(s): PROCALCITON, LATICACIDVEN in the last 168 hours.  No results found for this or any previous visit (from the past 240 hour(s)).       Radiology Studies: No results found.      Scheduled Meds: . ARIPiprazole  5 mg Oral Q breakfast  . enoxaparin (LOVENOX) injection  40 mg Subcutaneous Q24H  . escitalopram  10 mg Oral Daily  . folic acid  1 mg Oral Daily  . insulin aspart  0-15 Units Subcutaneous TID WC  . insulin aspart  0-5 Units Subcutaneous QHS  . levalbuterol  0.63 mg Nebulization QID  . LORazepam  1 mg Oral BID  . mometasone-formoterol  2 puff Inhalation BID  . multivitamin with minerals  1 tablet Oral Daily  . pravastatin  20 mg Oral q1800  . predniSONE  20 mg Oral Q breakfast  . tamsulosin  0.4 mg Oral Daily  . thiamine  100 mg Oral Daily   Or  . thiamine  100 mg Intravenous Daily  . tiotropium  18 mcg Inhalation Daily   Continuous Infusions:   LOS: 0 days     Cordelia Poche, MD [facility] 09/16/2017, 3:52 PM Pager: [phone]  If 7PM-7AM, please contact night-coverage [email or web address] 09/16/2017, 3:52 PM
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
## 2017-09-17 DIAGNOSIS — J441 Chronic obstructive pulmonary disease with (acute) exacerbation: Principal | ICD-10-CM

## 2017-09-17 DIAGNOSIS — F419 Anxiety disorder, unspecified: Secondary | ICD-10-CM

## 2017-09-17 DIAGNOSIS — J9611 Chronic respiratory failure with hypoxia: Secondary | ICD-10-CM

## 2017-09-17 DIAGNOSIS — F329 Major depressive disorder, single episode, unspecified: Secondary | ICD-10-CM

## 2017-09-17 DIAGNOSIS — J9612 Chronic respiratory failure with hypercapnia: Secondary | ICD-10-CM

## 2017-09-17 LAB — GLUCOSE, CAPILLARY
GLUCOSE-CAPILLARY: 221 mg/dL — AB (ref 70–99)
Glucose-Capillary: 79 mg/dL (ref 70–99)
Glucose-Capillary: 148 mg/dL — ABNORMAL HIGH (ref 70–99)
Glucose-Capillary: 223 mg/dL — ABNORMAL HIGH (ref 70–99)
Glucose-Capillary: 75 mg/dL (ref 70–99)

## 2017-09-17 NOTE — Clinical Social Work Note (Signed)
Clinical Social Work Assessment  Patient Details  Name: Taylor [name] MRN: [id number] Date of Birth: [birth year]  Date of referral:  09/15/17               Reason for consult:  Housing Concerns/Homelessness                Permission sought to share information with:  Other Permission granted to share information::  Yes, Verbal Permission Granted  Name::     Taylor [name] [phone]  Agency::     Relationship::     Contact Information:     Housing/Transportation Living arrangements for the past 2 months:  [place] Paper of Information:  Patient, Medical Team, Other (Comment Required)(DSS) Patient Interpreter Needed:  None Criminal Activity/Legal Involvement Pertinent to Current Situation/Hospitalization:  (unknown current legal issues, pt is registered on [place] sex offender registry) Significant Relationships:  Siblings Lives with:  Self, Other (Comment)(has been living at boarding house, now homeless) Do you feel safe going back to the place where you live?  (n/a) Need for family participation in patient care:  No (Coment)(pt denies)  Care giving concerns:  Pt admitted from [facility], boarding house type of residence, where he states he cannot return because "I think they are shutting down." Admitted for COPD exacerbation, chronic o2 requirement. Pt states he has bipolar disorder and "has Medicaid so he can get his meds." States he used to go to [facility] but has not been since being released from incarceration 2 years ago. Pt has open APS case. Has sisters in the area that he is in contact with but states he cannot stay with.    Social Worker assessment / plan:  CSW consulted to assist with housing issues impacting disposition. See above for brief history. Pt reports he is planning to "make calls to find somewhere to rent or see if he can stay with a friend today so he can leave the hospital." States his living situation has been transient since  jail release. Due to status on sex offender [place] registry, housing options and facility placement has extreme barriers. CSW left voicemail with pt's APS worker (above) in order to assist coordinating any available resources. Pt has Medicaid [place] transport available to him and are resources for food pantries. Otherwise no interventions to offer. Will follow up on pt's plans and with APS to determine disposition plan.   Employment status:  Disabled (Comment on whether or not currently receiving Disability)(receives disability income) Forensic scientist:  Medicaid [place] PT Recommendations:  Home with [place] / Referral to community resources:     Patient/Family's Response to care:  Expresses thanks  Patient/Family's Understanding of and Emotional Response to Diagnosis, Current Treatment, and Prognosis:  Pt shows understanding of his reasons for admission as stating to CSW, however based on hx of multiple ED and inpatient encounters for same, demonstrates hx of poor follow up and medical treatment compliance.  Emotionally pt seemed flat and depressed but eye contact and interaction were appropriate.  Emotional Assessment Appearance:  Appears stated age Attitude/Demeanor/Rapport:  (appropriate but guarded) Affect (typically observed):  Flat Orientation:  Oriented to Self, Oriented to Place, Oriented to  Time, Oriented to Situation Alcohol / Substance use:  Not Applicable Psych involvement (Current and /or in the community):  No (Comment)(however does have hx of psychiatric issues and providers)  Discharge Needs  Concerns to be addressed:  Homelessness Readmission within the last 30 days:  Yes Current discharge  risk:  Homeless(with need for home o2) Barriers to Discharge:  Homeless with medical needs   Taylor Nephew, LCSW 09/17/2017, 10:12 AM  [phone]

## 2017-09-17 NOTE — Care Management Note (Signed)
Case Management Note  Patient Details  Name: Taylor [name] MRN: [id number] Date of Birth: October 23, 1955  Subjective/Objective:                    Action/Plan:  Pt is homeless CSW consulting. Unable to send [facility] related to pt not having a place to live.   Expected Discharge Date:                  Expected Discharge Plan:  Homeless Shelter  In-House Referral:  Clinical Social Work  Discharge planning Services  CM Consult  Post Acute Care Choice:    Choice offered to:     DME Arranged:    DME Agency:     HH Arranged:    HH Agency:     Status of Service:  In process, will continue to follow  If discussed at Long Length of Stay Meetings, dates discussed:    Additional CommentsPurcell Mouton, RN 09/17/2017, 3:07 PM

## 2017-09-17 NOTE — Progress Notes (Signed)
PROGRESS NOTE    Taylor [name]  ZDG:[id number] DOB: 08/05/1955 DOA: 09/14/2017 PCP: [facility]    Brief Narrative:  62 y.o. malewith medical history significant ofCOPD, alcohol abuse, anxiety, depression, hyperlipidemia, tobacco abuse. Patient presented secondary to shortness of breath. He has had, including this admission, 14 admissions in the last 6 months for similar presentation. He was treated for a COPD exacerbation and had significant tachycardia after receiving albuterol treatment. Functionally very poor tolerance.  Assessment & Plan:   Active Problems:   Alcohol abuse   COPD exacerbation (HCC)   Anxiety and depression   Chronic respiratory failure with hypoxia and hypercapnia ([place])   Palliative care by specialist   DNR (do not resuscitate)  COPD exacerbation Improved today. Refused PT yesterday. PT pending today -Continue to Xopenex -Continue Spiriva and Dulera -Continue prednisone -PT consulted. Continue with O2 as needed  Chronic respiratory failure with hypoxia and hypercapnia -Remains stable at this time.  Anxiety/depression -Appears to be better controlled on current regimen. -Patient is continued on scheduled Ativan BID  Alcohol abuse Scores low to moderate. -Will continue on CIWA -No evidence of withdrawals  Diabetes mellitus -Uncontrolled with hyperglycemia. Worsened with IV steroids -Will continue on SSI coverage as needed   DVT prophylaxis: Lovenox subq Code Status: DNR Family Communication: Pt in room, family not at bedside Disposition Plan: Uncertain at this time  Consultants:   Palliative Care  Procedures:     Antimicrobials: Anti-infectives (From admission, onward)   None       Subjective: Without complaints  Objective: Vitals:   09/17/17 1030 09/17/17 1034 09/17/17 1403 09/17/17 1636  BP:   109/73   Pulse:   86   Resp:   18   Temp:   98.8 F (37.1 C)   TempSrc:   Oral   SpO2: 93% 93%  97% 97%  Weight:      Height:        Intake/Output Summary (Last 24 hours) at 09/17/2017 1644 Last data filed at 09/17/2017 1406 Gross per 24 hour  Intake 1200 ml  Output 2075 ml  Net -875 ml   Filed Weights   09/15/17 0619  Weight: 70.4 kg    Examination:  General exam: Appears calm and comfortable  Respiratory system: Clear to auscultation. Respiratory effort normal. Cardiovascular system: S1 & S2 heard, RRR Gastrointestinal system: Abdomen is nondistended, soft and nontender. No organomegaly or masses felt. Normal bowel sounds heard. Central nervous system: Alert and oriented. No focal neurological deficits. Extremities: Symmetric 5 x 5 power. Skin: No rashes, lesions  Psychiatry: Judgement and insight appear normal. Mood & affect appropriate.   Data Reviewed: I have personally reviewed following labs and imaging studies  CBC: Recent Labs  Lab 09/10/17 2214 09/14/17 1436  WBC 14.5* 9.2  NEUTROABS 10.7* 7.0  HGB 14.8 13.7  HCT 47.0 43.2  MCV 97.1 96.6  PLT 240 595   Basic Metabolic Panel: Recent Labs  Lab 09/10/17 2214 09/14/17 1436  NA 141 142  K 4.2 4.4  CL 93* 97*  CO2 40* 35*  GLUCOSE 241* 227*  BUN 13 19  CREATININE 0.87 0.68  CALCIUM 8.8* 8.6*   GFR: Estimated Creatinine Clearance: 95.3 mL/min (by C-G formula based on SCr of 0.68 mg/dL). Liver Function Tests: Recent Labs  Lab 09/10/17 2214  AST 18  ALT 24  ALKPHOS 61  BILITOT 0.6  PROT 6.0*  ALBUMIN 3.9   No results for input(s): LIPASE, AMYLASE in the last  168 hours. No results for input(s): AMMONIA in the last 168 hours. Coagulation Profile: No results for input(s): INR, PROTIME in the last 168 hours. Cardiac Enzymes: Recent Labs  Lab 09/10/17 2214  TROPONINI <0.03   BNP (last 3 results) No results for input(s): PROBNP in the last 8760 hours. HbA1C: No results for input(s): HGBA1C in the last 72 hours. CBG: Recent Labs  Lab 09/16/17 1200 09/16/17 1634 09/16/17 2118  09/17/17 0730 09/17/17 1152  GLUCAP 181* 261* 79 148* 223*   Lipid Profile: No results for input(s): CHOL, HDL, LDLCALC, TRIG, CHOLHDL, LDLDIRECT in the last 72 hours. Thyroid Function Tests: No results for input(s): TSH, T4TOTAL, FREET4, T3FREE, THYROIDAB in the last 72 hours. Anemia Panel: No results for input(s): VITAMINB12, FOLATE, FERRITIN, TIBC, IRON, RETICCTPCT in the last 72 hours. Sepsis Labs: No results for input(s): PROCALCITON, LATICACIDVEN in the last 168 hours.  No results found for this or any previous visit (from the past 240 hour(s)).   Radiology Studies: No results found.  Scheduled Meds: . ARIPiprazole  5 mg Oral Q breakfast  . enoxaparin (LOVENOX) injection  40 mg Subcutaneous Q24H  . escitalopram  10 mg Oral Daily  . folic acid  1 mg Oral Daily  . insulin aspart  0-15 Units Subcutaneous TID WC  . insulin aspart  0-5 Units Subcutaneous QHS  . levalbuterol  0.63 mg Nebulization QID  . LORazepam  1 mg Oral BID  . mometasone-formoterol  2 puff Inhalation BID  . multivitamin with minerals  1 tablet Oral Daily  . pravastatin  20 mg Oral q1800  . predniSONE  20 mg Oral Q breakfast  . tamsulosin  0.4 mg Oral Daily  . thiamine  100 mg Oral Daily   Or  . thiamine  100 mg Intravenous Daily  . tiotropium  18 mcg Inhalation Daily   Continuous Infusions:   LOS: 1 day   Marylu Lund, MD [facility] Pager [phone]  If 7PM-7AM, please contact night-coverage [email or web address] Password [facility] 09/17/2017, 4:44 PM

## 2017-09-17 NOTE — Progress Notes (Signed)
Physical Therapy Treatment Patient Details Name: Taylor [name] MRN: [id number] DOB: [birth year] Today's Date: 09/17/2017    History of Present Illness Pt is a 62 y/o male admitted for COPD exacerbation. PMH includes COPD on 3L of oxygen, bipolar disorder, prostate cancer, DM, colon cancer, alcohol abuse, anxiety, depression, hyperlipidemia, tobacco abuse    PT Comments    Pt very cooperative and able to walk full circuit of hall but requiring multiple standing/seated rest breaks to complete task.  Pt SOB but with SaO2 maintained at 95% or higher on 3L.   Follow Up Recommendations  Home health PT;Other (comment)     Equipment Recommendations  None recommended by PT    Recommendations for Other Services       Precautions / Restrictions Precautions Precautions: Fall Precaution Comments: suicide precautions  Restrictions Weight Bearing Restrictions: No Other Position/Activity Restrictions: WBAT    Mobility  Bed Mobility Overal bed mobility: Modified Independent Bed Mobility: Supine to Sit     Supine to sit: Modified independent (Device/Increase time)        Transfers Overall transfer level: Needs assistance Equipment used: None Transfers: Sit to/from Stand Sit to Stand: Min guard;Supervision         General transfer comment: min guard for safety with initial standing  Ambulation/Gait Ambulation/Gait assistance: Min guard Gait Distance (Feet): 450 Feet Assistive device: None Gait Pattern/deviations: Step-through pattern;Decreased stride length Gait velocity: decreased with multiple rests   General Gait Details: Mild instability increasing with increased fatigue but no LOB.  Multiple short standing and sitting rests to complete task.  Pt demonstrating and reporting SOB but SaO2 remains 95% or higher throughout - HR elevated to 111.   Stairs             Wheelchair Mobility    Modified Rankin (Stroke Patients Only)       Balance Overall  balance assessment: Needs assistance Sitting-balance support: Feet supported Sitting balance-Leahy Scale: Good     Standing balance support: No upper extremity supported Standing balance-Leahy Scale: Good                              Cognition Arousal/Alertness: Awake/alert Behavior During Therapy: WFL for tasks assessed/performed Overall Cognitive Status: No family/caregiver present to determine baseline cognitive functioning                                        Exercises      General Comments        Pertinent Vitals/Pain Pain Assessment: No/denies pain    Home Living                      Prior Function            PT Goals (current goals can now be found in the care plan section) Acute Rehab PT Goals Patient Stated Goal: none stated  PT Goal Formulation: With patient Time For Goal Achievement: 09/22/17 Potential to Achieve Goals: Fair Progress towards PT goals: Progressing toward goals    Frequency    Min 3X/week      PT Plan Current plan remains appropriate    Co-evaluation              AM-PAC PT "6 Clicks" Daily Activity  Outcome Measure  Difficulty turning over in bed (including adjusting bedclothes, sheets and  blankets)?: None Difficulty moving from lying on back to sitting on the side of the bed? : None Difficulty sitting down on and standing up from a chair with arms (e.g., wheelchair, bedside commode, etc,.)?: None Help needed moving to and from a bed to chair (including a wheelchair)?: A Little Help needed walking in hospital room?: A Little Help needed climbing 3-5 steps with a railing? : A Little 6 Click Score: 21    End of Session Equipment Utilized During Treatment: Oxygen Activity Tolerance: Patient limited by fatigue Patient left: in chair;with call bell/phone within reach Nurse Communication: Mobility status PT Visit Diagnosis: Other abnormalities of gait and mobility (R26.89)      Time: 3762-8315 PT Time Calculation (min) (ACUTE ONLY): 15 min  Charges:  $Gait Training: 8-22 mins                     Pg [phone]    Catina Nuss 09/17/2017, 1:00 PM

## 2017-09-18 LAB — BASIC METABOLIC PANEL
ANION GAP: 7 (ref 5–15)
BUN: 22 mg/dL (ref 8–23)
CHLORIDE: 95 mmol/L — AB (ref 98–111)
CO2: 36 mmol/L — ABNORMAL HIGH (ref 22–32)
Calcium: 8.4 mg/dL — ABNORMAL LOW (ref 8.9–10.3)
Creatinine, Ser: 0.72 mg/dL (ref 0.61–1.24)
GFR calc Af Amer: >60 mL/min (ref >60)
Glucose, Bld: 150 mg/dL — ABNORMAL HIGH (ref 70–99)
POTASSIUM: 4 mmol/L (ref 3.5–5.1)
SODIUM: 138 mmol/L (ref 135–145)

## 2017-09-18 LAB — GLUCOSE, CAPILLARY
GLUCOSE-CAPILLARY: 241 mg/dL — AB (ref 70–99)
Glucose-Capillary: 131 mg/dL — ABNORMAL HIGH (ref 70–99)

## 2017-09-18 LAB — MAGNESIUM: MAGNESIUM: 2.3 mg/dL (ref 1.7–2.4)

## 2017-09-18 NOTE — Progress Notes (Signed)
Met with pt multiple times at bedside today to discuss DC plans. See previous notes for barriers.  Spoke with pt's DSS worker [phone], who reports encouraging pt to agree to having assigned payee to manage his finances "as he keeps not paying his rent and bills and getting evicted." No immediate housing or financial assistance options available. Pt reports that he plans to "Stay at the [facility] once he is discharged," however then states that "he is out of money till he gets his check on the 1st." CSW encouraged pt to identify family/friends/supports in area who could be inquired of to assist with place to stay until then. CSW and pt called sister Deborah [phone], left voicemail. Unable to utilize area shelters in meantime due to O2 concentrator.  No immediate housing options- pt and CSW awaiting call back from sister to investigate this option and pt encouraged to identify other individuals as inquiry options as well.   , MSW, LCSW Clinical Social Work 09/18/2017 [phone]   

## 2017-09-18 NOTE — Progress Notes (Signed)
PROGRESS NOTE    Taylor [name]  GQQ:[id number] DOB: [birth year] DOA: 09/14/2017 PCP: [facility]    Brief Narrative:  62 y.o. malewith medical history significant ofCOPD, alcohol abuse, anxiety, depression, hyperlipidemia, tobacco abuse. Patient presented secondary to shortness of breath. He has had, including this admission, 14 admissions in the last 6 months for similar presentation. He was treated for a COPD exacerbation and had significant tachycardia after receiving albuterol treatment. Functionally very poor tolerance.  Assessment & Plan:   Active Problems:   Alcohol abuse   COPD exacerbation (HCC)   Anxiety and depression   Chronic respiratory failure with hypoxia and hypercapnia ([place])   Palliative care by specialist   DNR (do not resuscitate)  COPD exacerbation Improved today. Refused PT yesterday. PT pending today -Continue to Xopenex -Continue Spiriva and Dulera -Continue prednisone -remains stable on baseline 3LNC -Appreciate input by palliative care and SW. Discharge planning in progress  Chronic respiratory failure with hypoxia and hypercapnia -Remains stable at this time.  Anxiety/depression -Appears to be better controlled on current regimen. -Patient is continued on scheduled Ativan BID  Alcohol abuse Scores low to moderate. -Will continue on CIWA -No evidence of withdrawals  Diabetes mellitus -Uncontrolled with hyperglycemia. Worsened with IV steroids -Will continue on SSI coverage as needed   DVT prophylaxis: Lovenox subq Code Status: DNR Family Communication: Pt in room, family not at bedside Disposition Plan: Uncertain at this time  Consultants:   Palliative Care  Procedures:     Antimicrobials: Anti-infectives (From admission, onward)   None      Subjective: No complaints at present  Objective: Vitals:   09/17/17 2106 09/18/17 0441 09/18/17 0758 09/18/17 0801  BP: 112/80 114/76    Pulse: 83 82     Resp: 20 16    Temp: 98.4 F (36.9 C) 98 F (36.7 C)    TempSrc: Oral Oral    SpO2: 99% 96% 96% 96%  Weight:      Height:        Intake/Output Summary (Last 24 hours) at 09/18/2017 1302 Last data filed at 09/18/2017 1153 Gross per 24 hour  Intake 1200 ml  Output 1555 ml  Net -355 ml   Filed Weights   09/15/17 0619  Weight: 70.4 kg    Examination: General exam: Awake, laying in bed, in nad Respiratory system: Normal respiratory effort, no wheezing  Data Reviewed: I have personally reviewed following labs and imaging studies  CBC: Recent Labs  Lab 09/14/17 1436  WBC 9.2  NEUTROABS 7.0  HGB 13.7  HCT 43.2  MCV 96.6  PLT 671   Basic Metabolic Panel: Recent Labs  Lab 09/14/17 1436 09/18/17 0425  NA 142 138  K 4.4 4.0  CL 97* 95*  CO2 35* 36*  GLUCOSE 227* 150*  BUN 19 22  CREATININE 0.68 0.72  CALCIUM 8.6* 8.4*  MG  --  2.3   GFR: Estimated Creatinine Clearance: 95.3 mL/min (by C-G formula based on SCr of 0.72 mg/dL). Liver Function Tests: No results for input(s): AST, ALT, ALKPHOS, BILITOT, PROT, ALBUMIN in the last 168 hours. No results for input(s): LIPASE, AMYLASE in the last 168 hours. No results for input(s): AMMONIA in the last 168 hours. Coagulation Profile: No results for input(s): INR, PROTIME in the last 168 hours. Cardiac Enzymes: No results for input(s): CKTOTAL, CKMB, CKMBINDEX, TROPONINI in the last 168 hours. BNP (last 3 results) No results for input(s): PROBNP in the last 8760 hours. HbA1C: No results  for input(s): HGBA1C in the last 72 hours. CBG: Recent Labs  Lab 09/17/17 1152 09/17/17 1646 09/17/17 2111 09/18/17 0757 09/18/17 1202  GLUCAP 223* 221* 75 131* 241*   Lipid Profile: No results for input(s): CHOL, HDL, LDLCALC, TRIG, CHOLHDL, LDLDIRECT in the last 72 hours. Thyroid Function Tests: No results for input(s): TSH, T4TOTAL, FREET4, T3FREE, THYROIDAB in the last 72 hours. Anemia Panel: No results for input(s):  VITAMINB12, FOLATE, FERRITIN, TIBC, IRON, RETICCTPCT in the last 72 hours. Sepsis Labs: No results for input(s): PROCALCITON, LATICACIDVEN in the last 168 hours.  No results found for this or any previous visit (from the past 240 hour(s)).   Radiology Studies: No results found.  Scheduled Meds: . ARIPiprazole  5 mg Oral Q breakfast  . enoxaparin (LOVENOX) injection  40 mg Subcutaneous Q24H  . escitalopram  10 mg Oral Daily  . folic acid  1 mg Oral Daily  . insulin aspart  0-15 Units Subcutaneous TID WC  . insulin aspart  0-5 Units Subcutaneous QHS  . levalbuterol  0.63 mg Nebulization QID  . LORazepam  1 mg Oral BID  . mometasone-formoterol  2 puff Inhalation BID  . multivitamin with minerals  1 tablet Oral Daily  . pravastatin  20 mg Oral q1800  . predniSONE  20 mg Oral Q breakfast  . tamsulosin  0.4 mg Oral Daily  . thiamine  100 mg Oral Daily   Or  . thiamine  100 mg Intravenous Daily  . tiotropium  18 mcg Inhalation Daily   Continuous Infusions:   LOS: 2 days   Marylu Lund, MD [facility] Pager [phone]  If 7PM-7AM, please contact night-coverage [email or web address] Password [email or web address] 09/18/2017, 1:02 PM

## 2017-09-18 NOTE — Progress Notes (Signed)
Patient ID: Taylor [name], male   DOB: 1955-02-27, 62 y.o.   MRN: [id number]  This NP visited patient at the bedside for ongoing emotional support and other palliative medicine needs.  Patient is alert and oriented and we continue our conversation regarding his current medical, psychosocial situation.  He is alert and oriented and sitting up eating breakfast tympanic.  He appears well this morning.  When patient has the support needed to ensure  medications to be given appropriately and provided with nutritional meals his overall health and wellness improved dramatically.  He tells me he is hopeful he will be provided with " a good  living situation" before his discharge from the hospital.  I spoke to social work/[place] and she tells me that she is working on an option that a support person would  help control his finances in order to ensure proper housing.  This would be very helpful.  Palliative medicine most shadow for needs.   Discussed with Dr Wyline Copas  Total time spent on the unit was 20 minutes  Greater than 50% of the time was spent in counseling and coordination of care  Wadie Lessen NP  Palliative Medicine Team Team Phone # [phone] Pager [phone]

## 2017-09-19 LAB — GLUCOSE, CAPILLARY
Glucose-Capillary: 151 mg/dL — ABNORMAL HIGH (ref 70–99)
Glucose-Capillary: 178 mg/dL — ABNORMAL HIGH (ref 70–99)

## 2017-09-19 MED ORDER — LEVALBUTEROL HCL 0.63 MG/3ML IN NEBU
0.6300 mg | INHALATION_SOLUTION | Freq: Three times a day (TID) | RESPIRATORY_TRACT | Status: DC
  Administered 2017-09-19: 0.63 mg via RESPIRATORY_TRACT
  Filled 2017-09-19: qty 3

## 2017-09-19 NOTE — Care Management Note (Signed)
Case Management Note  Patient Details  Name: Taylor [name] MRN: [id number] Date of Birth: [birth year]  Subjective/Objective:                    Action/Plan:Pt states that he will go stay with a friend at [address], [place], [place] and have home O2.    Floor to discharge[F]  Complete discharge planning  Hemodynamic stability  Mental status at baseline  No evidence of infection, or outpatient treatment planned  Uncompensated acidosis absent  Oxygenation at baseline or acceptable for next level of care[G]  Airflow rates at baseline or acceptable for next level of care  Eating and sleeping without frequent dyspnea  Breathing comfortably at rest  Discharge plans and education understood  Ambulatory  Oral hydration, medications, and diet  Pulse oximetry  Oxygen as needed  Airflow measurements  Inhaled bronchodilator regimen established and feasible at next level of care  Oral steroids  Possible oral antibiotics  Possible vaccination[H]       Expected Discharge Date:                  Expected Discharge Plan:  Homeless Shelter  In-House Referral:  Clinical Social Work  Discharge planning Services  CM Consult  Post Acute Care Choice:    Choice offered to:     DME Arranged:    DME Agency:     HH Arranged:    [place] Agency:     Status of Service:  In process, will continue to follow  If discussed at Long Length of Stay Meetings, dates discussed:    Additional CommentsPurcell Mouton, RN 09/19/2017, 11:42 AM

## 2017-09-19 NOTE — Care Management Note (Signed)
Case Management Note  Patient Details  Name: CARRICK RIJOS MRN: [id number] Date of Birth: 08/23/1955  Subjective/Objective:                    Action/Plan:   Expected Discharge Date:                  Expected Discharge Plan:  Homeless Shelter  In-House Referral:  Clinical Social Work  Discharge planning Services  CM Consult  Post Acute Care Choice:    Choice offered to:     DME Arranged:    DME Agency:     HH Arranged:    [place] Agency:     Status of Service:  In process, will continue to follow  If discussed at Long Length of Stay Meetings, dates discussed:    Additional CommentsPurcell Mouton, RN 09/19/2017, 11:52 AM

## 2017-09-19 NOTE — Progress Notes (Signed)
Pt states he has home O2 at home. Discussed with care management. SRP ,RN

## 2017-09-19 NOTE — Progress Notes (Signed)
Pt states he has worked out his home situation and at discharge he has a place to go to. MD updated. SRP, RN

## 2017-09-19 NOTE — Discharge Summary (Addendum)
Physician Discharge Summary  Taylor [name] ERD:[id number] DOB: 06-Oct-1955 DOA: 09/14/2017  PCP: [facility]  Admit date: 09/14/2017 Discharge date: 09/19/2017  Admitted From: Home Disposition:  Home  Recommendations for Outpatient Follow-up:  1. Follow up with PCP in 1-2 weeks 2. Please obtain BMP/CBC in one week 3. Please follow up on the following pending results:  Equipment/Devices: Baseline 3LNC    Discharge Condition:Improved CODE STATUS:DNR Diet recommendation: Diabetic   Brief/Interim Summary: 62 y.o.malewith medical history significant ofCOPD, alcohol abuse, anxiety, depression, hyperlipidemia, tobacco abuse. Patient presented secondary to shortness of breath. He has had, including this admission, 14 admissions in the last 6 months for similar presentation. He was treated for a COPD exacerbation and had significant tachycardia after receiving albuterol treatment. Functionally very poor tolerance.  COPD exacerbation Improved today. Refused PT yesterday. PT pending today -Continueto Xopenex -Continue Spiriva and Dulera -Continue prednisone -remains stable on baseline 3LNC -Appreciate input by palliative care and SW. Plan discharge today  Acute on Chronic respiratory failure with hypoxia and hypercapnia -Remains stable at this time.  Anxiety/depression -Appears to be better controlled on current regimen. -Patient is continued onscheduled Ativan BID  Alcohol abuse Scores low to moderate. -Will continue on CIWA -No evidence of withdrawals  Diabetes mellitus -Uncontrolled with hyperglycemia. Worsened with IV steroids -Continued on SSI coverage as needed while in hospital   Discharge Diagnoses:  Active Problems:   Alcohol abuse   COPD exacerbation (HCC)   Anxiety and depression   Chronic respiratory failure with hypoxia and hypercapnia ([place])   Palliative care by specialist   DNR (do not resuscitate)    Discharge  Instructions   Allergies as of 09/19/2017      Reactions   Codeine Nausea And Vomiting      Medication List    TAKE these medications   albuterol 108 (90 Base) MCG/ACT inhaler Commonly known as:  PROVENTIL HFA;VENTOLIN HFA Inhale 2 puffs into the lungs every 6 (six) hours as needed for wheezing or shortness of breath.   albuterol (5 MG/ML) 0.5% nebulizer solution Commonly known as:  PROVENTIL Take 1 mL (5 mg total) by nebulization every 6 (six) hours as needed for wheezing or shortness of breath.   ALPRAZolam 0.5 MG tablet Commonly known as:  XANAX Take 1 tablet (0.5 mg total) by mouth 3 (three) times daily as needed for anxiety.   ARIPiprazole 5 MG tablet Commonly known as:  ABILIFY Take 1 tablet (5 mg total) by mouth daily with breakfast.   blood glucose meter kit and supplies Kit Dispense based on patient and insurance preference. Use up to four times daily as directed. (FOR ICD-9 250.00, 250.01).   escitalopram 10 MG tablet Commonly known as:  LEXAPRO Take 1 tablet (10 mg total) by mouth daily.   lovastatin 20 MG tablet Commonly known as:  MEVACOR Take 1 tablet (20 mg total) by mouth at bedtime.   metFORMIN 500 MG tablet Commonly known as:  GLUCOPHAGE Take 1 tablet (500 mg total) by mouth 2 (two) times daily with a meal.   mometasone-formoterol 100-5 MCG/ACT Aero Commonly known as:  DULERA Inhale 2 puffs into the lungs 2 (two) times daily.   OXYGEN Inhale 3 L into the lungs.   predniSONE 20 MG tablet Commonly known as:  DELTASONE Take 20 mg by mouth daily with breakfast. What changed:  Another medication with the same name was removed. Continue taking this medication, and follow the directions you see here.   tamsulosin 0.4  MG Caps capsule Commonly known as:  FLOMAX Take 0.4 mg by mouth daily.   tiotropium 18 MCG inhalation capsule Commonly known as:  SPIRIVA Place 1 capsule (18 mcg total) into inhaler and inhale daily.      Follow-up [facility]. Schedule an appointment as soon as possible for a visit in 1 week(s).   Contact information: [address] [place] [address] [phone]          Allergies  Allergen Reactions  . Codeine Nausea And Vomiting     Procedures/Studies: Ct Angio Chest Pe W Or Wo Contrast  Result Date: 08/24/2017 CLINICAL DATA:  62 y/o M; worsening shortness of breath. History of COPD. Elevated D-dimer. EXAM: CT ANGIOGRAPHY CHEST WITH CONTRAST TECHNIQUE: Multidetector CT imaging of the chest was performed using the standard protocol during bolus administration of intravenous contrast. Multiplanar CT image reconstructions and MIPs were obtained to evaluate the vascular anatomy. CONTRAST:  157m ISOVUE-370 IOPAMIDOL (ISOVUE-370) INJECTION 76% COMPARISON:  06/26/2017 CT angiogram of the chest. FINDINGS: Cardiovascular: Satisfactory opacification of the pulmonary arteries to the segmental level. No evidence of pulmonary embolism. Normal heart size. No pericardial effusion. Mild coronary artery calcific atherosclerosis. Mediastinum/Nodes: No enlarged mediastinal, hilar, or axillary lymph nodes. Thyroid gland, trachea, and esophagus demonstrate no significant findings. Lungs/Pleura: Lungs are clear. No pleural effusion or pneumothorax. Stable emphysema. Upper Abdomen: No acute abnormality. Musculoskeletal: No chest wall abnormality. No acute or significant osseous findings. Multiple stable chronic right posterior rib fractures. Review of the MIP images confirms the above findings. IMPRESSION: 1. No pulmonary embolus identified.  No acute pulmonary process. 2. Stable emphysema. Electronically Signed   By: LKristine GarbeM.D.   On: 08/24/2017 23:34   Nm Bone Scan Whole Body  Result Date: 09/01/2017 CLINICAL DATA:  Prostate carcinoma, elevated PSA. History of rib fractures, colon carcinoma EXAM: NUCLEAR MEDICINE WHOLE BODY BONE SCAN TECHNIQUE: Whole body anterior and  posterior images were obtained approximately 3 hours after intravenous injection of radiopharmaceutical. RADIOPHARMACEUTICALS:  21.1 mCi Technetium-949mDP IV COMPARISON:  CT 08/31/2017 FINDINGS: Focus of increased activity in the region of the lateral left femoral condyle near the knee, suggesting DJD. Otherwise physiologic distribution of radiopharmaceutical. IMPRESSION: No suggestion of osseous metastatic disease. Electronically Signed   By: D Lucrezia Europe.D.   On: 09/01/2017 15:29   Ct Abdomen Pelvis W Contrast  Result Date: 08/31/2017 CLINICAL DATA:  Prostate cancer. High risk. Staging. EXAM: CT ABDOMEN AND PELVIS WITH CONTRAST TECHNIQUE: Multidetector CT imaging of the abdomen and pelvis was performed using the standard protocol following bolus administration of intravenous contrast. CONTRAST:  10047mMNIPAQUE IOHEXOL 300 MG/ML  SOLN COMPARISON:  04/18/2017 FINDINGS: Lower chest: No acute abnormality. Hepatobiliary: No focal liver abnormality is seen. Tiny stone within the dependent portion of the gallbladder noted, image 25/3. No gallbladder wall thickening. No biliary dilatation identified. Pancreas: Unremarkable. No pancreatic ductal dilatation or surrounding inflammatory changes. Spleen: Normal in size without focal abnormality. Adrenals/Urinary Tract: Normal adrenal glands. Small bilateral kidney cysts identified. No mass or hydronephrosis. The urinary bladder appears normal. Stomach/Bowel: Stomach is normal. The small bowel loops have a normal course and caliber. No dilated loops of small bowel. Distal colonic diverticulosis identified without acute inflammation. Vascular/Lymphatic: Aortic atherosclerosis. No aneurysm. No adenopathy identified within the abdomen. No pelvic or inguinal adenopathy. Reproductive: Prostate is unremarkable. Other: No abdominopelvic ascites. There is a left inguinal hernia which contains fat only. Musculoskeletal: Degenerative disc disease noted within  the lumbar spine. No  suspicious bone lesions identified. IMPRESSION: 1. No findings identified to suggest metastatic disease from prostate cancer. 2. Kidney cysts 3.  Aortic Atherosclerosis (ICD10-I70.0). Electronically Signed   By: Kerby Moors M.D.   On: 08/31/2017 17:58   Dg Chest Port 1 View  Result Date: 09/14/2017 CLINICAL DATA:  Cough and dyspnea EXAM: PORTABLE CHEST 1 VIEW COMPARISON:  09/10/2017 chest radiograph. FINDINGS: Stable cardiomediastinal silhouette with normal heart size. No pneumothorax. No pleural effusion. Hyperinflated lungs. Emphysema. No pulmonary edema. No acute consolidative airspace disease. Healed deformities in multiple posterior right ribs. IMPRESSION: 1. No acute cardiopulmonary disease. 2. Hyperinflated lungs and emphysema, suggesting COPD. Electronically Signed   By: Ilona Sorrel M.D.   On: 09/14/2017 14:23   Dg Chest Portable 1 View  Result Date: 09/10/2017 CLINICAL DATA:  Shortness of breath. EXAM: PORTABLE CHEST 1 VIEW COMPARISON:  Multiple prior exams. Most recent radiograph 09/04/2017. Most recent CT 08/24/2017 FINDINGS: Chronic hyperinflation with emphysema. Normal heart size and mediastinal contours. No focal airspace opacity, pleural effusion, pneumothorax or pulmonary edema. Remote right rib fractures again seen. IMPRESSION: 1. No acute findings. 2.  Emphysema (ICD10-J43.9). Electronically Signed   By: Jeb Levering M.D.   On: 09/10/2017 22:40   Dg Chest Portable 1 View  Result Date: 09/04/2017 CLINICAL DATA:  62 y/o  M; shortness of breath and chest pain. EXAM: PORTABLE CHEST 1 VIEW COMPARISON:  08/30/2017 chest radiograph. FINDINGS: Normal cardiac silhouette. Stable emphysema and hyperinflation of the lungs. No consolidation. Chronic blunting of left costodiaphragmatic angle. No pleural effusion or pneumothorax. No acute osseous abnormality is evident. IMPRESSION: No active disease.  Stable emphysema. Electronically Signed   By: Kristine Garbe M.D.   On: 09/04/2017  20:28   Dg Chest Port 1 View  Result Date: 08/30/2017 CLINICAL DATA:  Acute onset of respiratory distress. EXAM: PORTABLE CHEST 1 VIEW COMPARISON:  Chest radiograph and CTA of the chest performed 08/24/2017 FINDINGS: The lungs are hyperexpanded, with changes of emphysema at the upper lung lobes. There is no evidence of focal opacification, pleural effusion or pneumothorax. The cardiomediastinal silhouette is within normal limits. No acute osseous abnormalities are seen. IMPRESSION: Findings of emphysema at the upper lung lobes. No acute cardiopulmonary process seen. Electronically Signed   By: Garald Balding M.D.   On: 08/30/2017 22:17   Dg Chest Port 1 View  Result Date: 08/24/2017 CLINICAL DATA:  Shortness of breath today, history COPD, asthma, pneumonia, prostate cancer, former smoker EXAM: PORTABLE CHEST 1 VIEW COMPARISON:  Portable exam 1822 hours compared to 08/14/2017 FINDINGS: Normal heart size, mediastinal contours, and pulmonary vascularity. Lungs emphysematous but clear. No pulmonary infiltrate, pleural effusion or pneumothorax. Bones demineralized with old healed fractures of the posterior RIGHT third fourth and fifth ribs. IMPRESSION: COPD changes without acute infiltrate. Electronically Signed   By: Lavonia Dana M.D.   On: 08/24/2017 18:38     Subjective: Eager to go home  Discharge Exam: Vitals:   09/19/17 0757 09/19/17 0758  BP:    Pulse:    Resp:    Temp:    SpO2: 98% 98%   Vitals:   09/19/17 0412 09/19/17 0753 09/19/17 0757 09/19/17 0758  BP: 109/90     Pulse: 87     Resp: 18     Temp: 98.7 F (37.1 C)     TempSrc: Oral     SpO2: 97% 98% 98% 98%  Weight:      Height:  General: Pt is alert, awake, not in acute distress Cardiovascular: RRR, S1/S2 +, no rubs, no gallops Respiratory: CTA bilaterally, no wheezing, no rhonchi Abdominal: Soft, NT, ND, bowel sounds + Extremities: no edema, no cyanosis   The results of significant diagnostics from this  hospitalization (including imaging, microbiology, ancillary and laboratory) are listed below for reference.     Microbiology: No results found for this or any previous visit (from the past 240 hour(s)).   Labs: BNP (last 3 results) Recent Labs    06/13/17 1003 08/24/17 1838 09/10/17 2215  BNP 33.8 34.7 83.8   Basic Metabolic Panel: Recent Labs  Lab 09/14/17 1436 09/18/17 0425  NA 142 138  K 4.4 4.0  CL 97* 95*  CO2 35* 36*  GLUCOSE 227* 150*  BUN 19 22  CREATININE 0.68 0.72  CALCIUM 8.6* 8.4*  MG  --  2.3   Liver Function Tests: No results for input(s): AST, ALT, ALKPHOS, BILITOT, PROT, ALBUMIN in the last 168 hours. No results for input(s): LIPASE, AMYLASE in the last 168 hours. No results for input(s): AMMONIA in the last 168 hours. CBC: Recent Labs  Lab 09/14/17 1436  WBC 9.2  NEUTROABS 7.0  HGB 13.7  HCT 43.2  MCV 96.6  PLT 181   Cardiac Enzymes: No results for input(s): CKTOTAL, CKMB, CKMBINDEX, TROPONINI in the last 168 hours. BNP: Invalid input(s): POCBNP CBG: Recent Labs  Lab 09/17/17 2111 09/18/17 0757 09/18/17 1202 09/19/17 0729 09/19/17 1104  GLUCAP 75 131* 241* 151* 178*   D-Dimer No results for input(s): DDIMER in the last 72 hours. Hgb A1c No results for input(s): HGBA1C in the last 72 hours. Lipid Profile No results for input(s): CHOL, HDL, LDLCALC, TRIG, CHOLHDL, LDLDIRECT in the last 72 hours. Thyroid function studies No results for input(s): TSH, T4TOTAL, T3FREE, THYROIDAB in the last 72 hours.  Invalid input(s): FREET3 Anemia work up No results for input(s): VITAMINB12, FOLATE, FERRITIN, TIBC, IRON, RETICCTPCT in the last 72 hours. Urinalysis    Component Value Date/Time   COLORURINE YELLOW 06/13/2017 0203   APPEARANCEUR CLEAR 06/13/2017 0203   LABSPEC 1.018 06/13/2017 0203   PHURINE 6.0 06/13/2017 0203   GLUCOSEU 50 (A) 06/13/2017 0203   HGBUR NEGATIVE 06/13/2017 0203   BILIRUBINUR NEGATIVE 06/13/2017 0203    KETONESUR NEGATIVE 06/13/2017 0203   PROTEINUR NEGATIVE 06/13/2017 0203   UROBILINOGEN 1.0 01/09/2013 1843   NITRITE NEGATIVE 06/13/2017 0203   LEUKOCYTESUR NEGATIVE 06/13/2017 0203   Sepsis Labs Invalid input(s): PROCALCITONIN,  WBC,  LACTICIDVEN Microbiology No results found for this or any previous visit (from the past 240 hour(s)).  Time spent: 46mn  SIGNED:   SMarylu Lund MD  [facility] 09/19/2017, 11:59 AM  If 7PM-7AM, please contact night-coverage

## 2017-09-19 NOTE — Progress Notes (Signed)
Pt discharged to home instructions reviewed with pt. Acknowledge understanding , awaits for EMS to pickup, pt states he has home oxygen. Pt instable condition with O2 on @ 3l sats 97%. SRP, RN

## 2017-09-19 NOTE — Progress Notes (Addendum)
Received call from [name], pt discharged to [facility] on [address], [place], [place], upon arrival @group  home, group home appear to be closed. Pt gave out false address to staff and EMS. Pt is depended on oxygen and refused to return to hospital and according to EMS; signed EMS release and stated he was going to call a taxi cab. Pt is depended on his O2 and pt is alert and oriented and able to make all rational decision concerning his care. SRP,RN

## 2017-09-19 NOTE — Progress Notes (Signed)
Pt states that he get his O2 from [facility].  A call to [phone] [place] revealed that the [facility] ordered pt's O2.  A call to [place] resp [phone] was made, VM left. Unable to secure O2 tank for pt. Pt on 3L O2 and desaturation quickly. Encourage pt to go to [facility] for support and hospital follow up.  [facility] was called for transportation to [address] [place], [place] [address]

## 2017-09-19 NOTE — Progress Notes (Signed)
Pt lied about having a place to go to.  [facility] took the pt to [place], the home was closed.  Pt is the one that gave this address that he was going to. [facility] was called because this CM could not secure a tank of O2 for the pt related to the [place]. The pt ensured his RN and myself that if [facility] took him to this address his O2 would be there. Pt is non complaint to using his O2.

## 2017-09-21 ENCOUNTER — Emergency Department (HOSPITAL_COMMUNITY)
Admission: EM | Admit: 2017-09-21 | Discharge: 2017-09-21 | Disposition: A | Discharge status: Home / Self Care | Payer: Medicaid Other | Source: Home / Self Care | Attending: Emergency Medicine | Admitting: Emergency Medicine

## 2017-09-21 ENCOUNTER — Emergency Department (HOSPITAL_COMMUNITY): Payer: Medicaid Other

## 2017-09-21 ENCOUNTER — Encounter (HOSPITAL_COMMUNITY): Payer: Self-pay | Admitting: Internal Medicine

## 2017-09-21 DIAGNOSIS — Z8546 Personal history of malignant neoplasm of prostate: Secondary | ICD-10-CM | POA: Insufficient documentation

## 2017-09-21 DIAGNOSIS — Z87891 Personal history of nicotine dependence: Secondary | ICD-10-CM | POA: Insufficient documentation

## 2017-09-21 DIAGNOSIS — J441 Chronic obstructive pulmonary disease with (acute) exacerbation: Secondary | ICD-10-CM

## 2017-09-21 DIAGNOSIS — F419 Anxiety disorder, unspecified: Secondary | ICD-10-CM

## 2017-09-21 DIAGNOSIS — Z79899 Other long term (current) drug therapy: Secondary | ICD-10-CM

## 2017-09-21 LAB — BASIC METABOLIC PANEL
ANION GAP: 8 (ref 5–15)
BUN: 14 mg/dL (ref 8–23)
CHLORIDE: 101 mmol/L (ref 98–111)
CO2: 31 mmol/L (ref 22–32)
CREATININE: 0.78 mg/dL (ref 0.61–1.24)
Calcium: 8.5 mg/dL — ABNORMAL LOW (ref 8.9–10.3)
GFR calc non Af Amer: >60 mL/min (ref >60)
Glucose, Bld: 222 mg/dL — ABNORMAL HIGH (ref 70–99)
POTASSIUM: 4 mmol/L (ref 3.5–5.1)
Sodium: 140 mmol/L (ref 135–145)

## 2017-09-21 LAB — CBC
HEMATOCRIT: 45 % (ref 39.0–52.0)
HEMOGLOBIN: 14.1 g/dL (ref 13.0–17.0)
MCH: 30.4 pg (ref 26.0–34.0)
MCHC: 31.3 g/dL (ref 30.0–36.0)
MCV: 97 fL (ref 78.0–100.0)
Platelets: 180 10*3/uL (ref 150–400)
RBC: 4.64 MIL/uL (ref 4.22–5.81)
RDW: 13 % (ref 11.5–15.5)
WBC: 12.8 10*3/uL — ABNORMAL HIGH (ref 4.0–10.5)

## 2017-09-21 LAB — GLUCOSE, CAPILLARY
GLUCOSE-CAPILLARY: 213 mg/dL — AB (ref 70–99)
Glucose-Capillary: 244 mg/dL — ABNORMAL HIGH (ref 70–99)

## 2017-09-21 MED ORDER — LORAZEPAM 2 MG/ML IJ SOLN
1.0000 mg | Freq: Once | INTRAMUSCULAR | Status: AC
  Administered 2017-09-21: 1 mg via INTRAVENOUS
  Filled 2017-09-21: qty 1

## 2017-09-21 MED ORDER — IPRATROPIUM-ALBUTEROL 0.5-2.5 (3) MG/3ML IN SOLN
3.0000 mL | Freq: Once | RESPIRATORY_TRACT | Status: AC
  Administered 2017-09-21: 3 mL via RESPIRATORY_TRACT
  Filled 2017-09-21: qty 3

## 2017-09-21 NOTE — ED Triage Notes (Signed)
Pt here from home c/o shortness of breath x3 days. COPD hx uses 3L all the time. For EMS pt was 98% on 3L. Given 5albuterol, 0.5 atrovent, 125 solumedrol, 2g magnesium. Sinus tach for EMS. Denies pain. Used home inhaler with no relief. Pt appears anxious for this RN.

## 2017-09-21 NOTE — ED Provider Notes (Signed)
[place] EMERGENCY DEPARTMENT Provider Note   CSN: [id number] Arrival date & time: 09/21/17  0825     History   Chief Complaint Chief Complaint  Patient presents with  . Shortness of Breath    HPI Taylor [name] is a 62 y.o. male.  Patient brought in by EMS.  Patient in respiratory distress.  There was some concerns that patient may need BiPAP initiated upon arrival.  Review of patient's records shows frequent admissions for COPD and anxiety component.  Lot of the consensus reading through the records was that felt there was a strong anxiety component and not as strong a COPD component that led to the patient's shortness of breath and and feeling very anxious and the feeling of doom.  Patient was switched over to 4 L of nasal cannula oxygen he is normally on 3 L at home he has oxygen at home.  He has a nebulizer treatment at home and is currently getting 40 mg of prednisone daily.  Patient was just recently discharged.  On the 4 L and with additional nebulizer treatment.  Patient settled down oxygen sats were in the mid to upper 90s.  Patient then adjusted down to his normal 3 L of oxygen and his sats remained in the 90s.  And patient was comfortable.  Patient received magnesium by EMS and also received Solu-Medrol by [place].     Past Medical History:  Diagnosis Date  . Asthma   . Bipolar 1 disorder ([place]) 02/05/2012  . Colon cancer ([place]) 04/11/11   adenocarcinoma of colon, 7/19 nodes pos.FINISHED CHEMO/DR. SHERRILL  . COPD (chronic obstructive pulmonary disease) ([place])    SMOKER  . Depression   . Emphysema of lung ([place])   . Full dentures   . Hemorrhoids   . On home oxygen therapy    "3L; 24/7" (05/29/2017)  . Pneumonia ~ 2016   "double pneumonia"  . Prostate cancer ([place])    Gleason score = 7, supposed to have radiation therapy but he has not followed up (05/29/2017)  . Rib fractures    hx of    Patient Active Problem List   Diagnosis Date Noted  .  Palliative care by specialist   . DNR (do not resuscitate)   . Chronic respiratory failure with hypoxia and hypercapnia ([place]) 09/14/2017  . Acute on chronic respiratory failure with hypoxia ([place]) 09/11/2017  . Anxiety and depression   . Prostate cancer ([place]) 09/04/2017  . Acute respiratory failure with hypercapnia ([place])   . SOB (shortness of breath)   . Malnutrition of moderate degree 06/14/2017  . Hyperglycemia 06/08/2017  . Constipation 06/08/2017  . SIRS (systemic inflammatory response syndrome) ([place]) 05/21/2017  . Tobacco abuse 05/13/2017  . HLD (hyperlipidemia) 05/13/2017  . Leukocytosis 04/06/2017  . Adjustment disorder with depressed mood 03/28/2017  . Adjustment disorder with mixed disturbance of emotions and conduct   . Acute on chronic respiratory failure with hypercapnia ([place]) 03/24/2017  . Normocytic normochromic anemia 03/24/2017  . COPD with acute exacerbation ([place]) 10/22/2016  . Alcohol abuse 01/09/2013  . COPD exacerbation ([place]) 01/09/2013  . Smoker 12/16/2012  . Inguinal hernia unilateral, non-recurrent, right 05/01/2012  . Dyspepsia 02/05/2012  . Bipolar 1 disorder ([place]) 02/05/2012  . 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 COPD  GOLD III 02/03/2012  . Thrombocytopenia ([place]) 02/03/2012  . Depression   . 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 COLON SURGERY  04/11/11  Sigmoid colectomy  . COLOSTOMY REVISION  04/11/2011   Procedure: COLON RESECTION SIGMOID;  Surgeon: [name], MD;  Location: [facility];  Service: General;  Laterality: N/A;  low anterior colon resection   . HERNIA REPAIR    . INGUINAL HERNIA REPAIR Right 05/01/2012   Procedure: HERNIA REPAIR INGUINAL ADULT;  Surgeon: [name], MD;  Location: [facility];  Service: General;  Laterality: Right;  . INSERTION OF MESH Right 05/01/2012   Procedure: INSERTION OF MESH;  Surgeon: [name], MD;  Location: [facility];  Service: General;   Laterality: Right;  . PORT-A-CATH REMOVAL Left 05/01/2012   Procedure: REMOVAL Infusion Port;  Surgeon: [name], MD;  Location: [facility];  Service: General;  Laterality: Left;  . PORTACATH PLACEMENT  05/02/2011   Procedure: INSERTION PORT-A-CATH;  Surgeon: [name], MD;  Location: [facility];  Service: General;  Laterality: N/A;  . PROSTATE BIOPSY          Home Medications    Prior to Admission medications   Medication Sig Start Date End Date Taking? Authorizing Provider  albuterol (PROVENTIL HFA;VENTOLIN HFA) 108 (90 Base) MCG/ACT inhaler Inhale 2 puffs into the lungs every 6 (six) hours as needed for wheezing or shortness of breath. 08/26/17   Isabelle Course, MD  albuterol (PROVENTIL) (5 MG/ML) 0.5% nebulizer solution Take 1 mL (5 mg total) by nebulization every 6 (six) hours as needed for wheezing or shortness of breath. 09/07/17   Danford, Suann Larry, MD  ALPRAZolam Duanne Moron) 0.5 MG tablet Take 1 tablet (0.5 mg total) by mouth 3 (three) times daily as needed for anxiety. 09/12/17   Purohit, Konrad Dolores, MD  ARIPiprazole (ABILIFY) 5 MG tablet Take 1 tablet (5 mg total) by mouth daily with breakfast. 08/27/17   Isabelle Course, MD  blood glucose meter kit and supplies KIT Dispense based on patient and insurance preference. Use up to four times daily as directed. (FOR ICD-9 250.00, 250.01). 09/07/17   Danford, Suann Larry, MD  escitalopram (LEXAPRO) 10 MG tablet Take 1 tablet (10 mg total) by mouth daily. 08/27/17   Isabelle Course, MD  lovastatin (MEVACOR) 20 MG tablet Take 1 tablet (20 mg total) by mouth at bedtime. 05/18/17   Nita Sells, MD  metFORMIN (GLUCOPHAGE) 500 MG tablet Take 1 tablet (500 mg total) by mouth 2 (two) times daily with a meal. Patient not taking: Reported on 09/14/2017 09/07/17   Edwin Dada, MD  mometasone-formoterol (DULERA) 100-5 MCG/ACT AERO Inhale 2 puffs into the lungs 2 (two) times daily. 05/18/17   Nita Sells, MD  OXYGEN Inhale 3 L into the  lungs.     [provider]  predniSONE (DELTASONE) 20 MG tablet Take 20 mg by mouth daily with breakfast.    [provider]  tamsulosin (FLOMAX) 0.4 MG CAPS capsule Take 0.4 mg by mouth daily.    [provider]  tiotropium (SPIRIVA HANDIHALER) 18 MCG inhalation capsule Place 1 capsule (18 mcg total) into inhaler and inhale daily. 08/29/17   Mesner, Corene Cornea, MD    Family History Family History  Problem Relation Age of Onset  . Heart disease Father   . Lung cancer Maternal Uncle        smoked    Social History Social History   Tobacco Use  . Smoking status: Former Smoker    Packs/day: 0.10    Years: 46.00    Pack years: 4.60    Types: Cigarettes  Last attempt to quit: 05/21/2017    Years since quitting: 0.3  . Smokeless tobacco: Former Systems developer    Types: Chew  Substance Use Topics  . Alcohol use: Not Currently    Comment: h/o use in the past, no h/o heavy use  . Drug use: No     Allergies   Codeine   Review of Systems Review of Systems  Constitutional: Negative for fever.  HENT: Negative for congestion.   Eyes: Negative for redness.  Respiratory: Positive for shortness of breath and wheezing.   Cardiovascular: Negative for chest pain.  Gastrointestinal: Negative for abdominal pain.  Genitourinary: Negative for dysuria.  Musculoskeletal: Negative for back pain.  Neurological: Negative for headaches.  Hematological: Does not bruise/bleed easily.  Psychiatric/Behavioral: Negative for confusion.     Physical Exam Updated Vital Signs BP 118/69   Pulse 92   Temp 98.6 F (37 C) (Oral)   Resp 20   Ht 1.803 m ('5\' 11"' )   Wt 70.8 kg   SpO2 98%   BMI 21.76 kg/m   Physical Exam  Constitutional: He is oriented to person, place, and time. He appears well-developed and well-nourished. No distress.  HENT:  Head: Normocephalic and atraumatic.  Mouth/Throat: Oropharynx is clear and moist.  Eyes: Pupils are equal, round, and reactive to light.  EOM are normal.  Neck: Neck supple.  Cardiovascular: Normal rate and regular rhythm.  Pulmonary/Chest: Effort normal. No respiratory distress. He has wheezes.  Abdominal: Soft. Bowel sounds are normal. There is no tenderness.  Musculoskeletal: Normal range of motion.  Neurological: He is alert and oriented to person, place, and time. No cranial nerve deficit or sensory deficit. He exhibits normal muscle tone. Coordination normal.  Skin: Skin is warm.  Nursing note and vitals reviewed.    ED Treatments / Results  Labs (all labs ordered are listed, but only abnormal results are displayed) Labs Reviewed  CBC - Abnormal; Notable for the following components:      Result Value   WBC 12.8 (*)    All other components within normal limits  BASIC METABOLIC PANEL - Abnormal; Notable for the following components:   Glucose, Bld 222 (*)    Calcium 8.5 (*)    All other components within normal limits    EKG EKG Interpretation  Date/Time:  Sunday September 21 2017 08:30:00 EDT Ventricular Rate:  117 PR Interval:    QRS Duration: 88 QT Interval:  311 QTC Calculation: 434 R Axis:   -13 Text Interpretation:  Sinus tachycardia Low voltage, precordial leads Confirmed by Fredia Sorrow [phone]) on 09/21/2017 8:37:16 AM   Radiology Dg Chest Port 1 View  Result Date: 09/21/2017 CLINICAL DATA:  Shortness of breath and wheezing EXAM: PORTABLE CHEST 1 VIEW COMPARISON:  09/14/2017 FINDINGS: Similar hyperinflation compatible with COPD/emphysema. No focal pneumonia, collapse or consolidation. Negative for edema, effusion or pneumothorax. Trachea midline. Normal heart size and vascularity. Monitor leads overlie the chest. Remote posterior right rib fractures. IMPRESSION: Stable hyperinflation. No interval change or superimposed acute process Electronically Signed   By: Jerilynn Mages.  Shick M.D.   On: 09/21/2017 09:14    Procedures Procedures (including critical care time)  Medications Ordered in  ED Medications  LORazepam (ATIVAN) injection 1 mg (1 mg Intravenous Given 09/21/17 0857)  ipratropium-albuterol (DUONEB) 0.5-2.5 (3) MG/3ML nebulizer solution 3 mL (3 mLs Nebulization Given 09/21/17 1029)     Initial Impression / Assessment and Plan / ED Course  I have reviewed the triage vital signs and the nursing  notes.  Pertinent labs & imaging results that were available during my care of the patient were reviewed by me and considered in my medical decision making (see chart for details).     Patient observed here also ambulated maintained his sats on his normal amount of 3 L of oxygen no significant shortness of breath.  Patient also given some Ativan for the anxiety which helped more than the nebulizer treatment.  Patient does have a prescription for Xanax at home but he refuses to take it.  Patient with some faint bilateral wheezing no significant wheezing upon arrival.   Instructed patient to continue using his nebulizers but do it 4 times a day instead of twice a day.  And continue his prednisone.  Make appointment follow-up with his doctor.  Return for any new or worse symptoms.  Final Clinical Impressions(s) / ED Diagnoses   Final diagnoses:  COPD exacerbation [facility])  Anxiety    ED Discharge Orders    None       Fredia Sorrow, MD 09/21/17 1540

## 2017-09-21 NOTE — Progress Notes (Signed)
The patient was received from EMS as a respiratory distress patient. The patient was receiving a duoneb treatment upon arrival. The patient was placed on a 4lpm N/C with o2 saturation at 100%. The patient does wear home o2 at 3lpm. BBS inspiratory and expiratory wheezing. No new orders at this time per Dr. Helane Gunther.

## 2017-09-21 NOTE — ED Notes (Signed)
Patient verbalizes understanding of discharge instructions. Opportunity for questioning and answers were provided. Armband removed by staff, pt discharged from ED.  

## 2017-09-21 NOTE — ED Notes (Signed)
Portable xray at bedside.

## 2017-09-21 NOTE — ED Notes (Signed)
PTAR bedside 

## 2017-09-21 NOTE — ED Notes (Signed)
ED Provider at bedside. 

## 2017-09-21 NOTE — ED Notes (Signed)
Pt ambulates with no assistanceand 3 L O2, requires a rest period before returning to room. Denies dizziness, reports fatigue.  At rest  89bpm, 98% While ambulating 103 bpm, 95% After ambulation 91 bpm 96%

## 2017-09-21 NOTE — Discharge Instructions (Addendum)
Continue your prednisone as we discussed at 40 mg a day.  Start using your nebulizer treatments with albuterol 4 times a day at least for the next 7 days.  Make an appointment to follow-up with your doctor.  Return for any new or worse symptoms.

## 2017-09-22 ENCOUNTER — Inpatient Hospital Stay (HOSPITAL_COMMUNITY)
Admission: EM | Admit: 2017-09-22 | Discharge: 2017-09-25 | DRG: 189 | Disposition: A | Discharge status: Home / Self Care | Payer: Medicaid Other | Attending: Internal Medicine | Admitting: Internal Medicine

## 2017-09-22 ENCOUNTER — Emergency Department (HOSPITAL_COMMUNITY): Payer: Medicaid Other

## 2017-09-22 ENCOUNTER — Other Ambulatory Visit: Payer: Self-pay

## 2017-09-22 ENCOUNTER — Encounter (HOSPITAL_COMMUNITY): Payer: Self-pay

## 2017-09-22 DIAGNOSIS — J439 Emphysema, unspecified: Secondary | ICD-10-CM | POA: Diagnosis present

## 2017-09-22 DIAGNOSIS — Z9049 Acquired absence of other specified parts of digestive tract: Secondary | ICD-10-CM

## 2017-09-22 DIAGNOSIS — J9622 Acute and chronic respiratory failure with hypercapnia: Secondary | ICD-10-CM | POA: Diagnosis present

## 2017-09-22 DIAGNOSIS — Z79899 Other long term (current) drug therapy: Secondary | ICD-10-CM

## 2017-09-22 DIAGNOSIS — J449 Chronic obstructive pulmonary disease, unspecified: Secondary | ICD-10-CM | POA: Diagnosis present

## 2017-09-22 DIAGNOSIS — Z7951 Long term (current) use of inhaled steroids: Secondary | ICD-10-CM | POA: Diagnosis not present

## 2017-09-22 DIAGNOSIS — Z85038 Personal history of other malignant neoplasm of large intestine: Secondary | ICD-10-CM

## 2017-09-22 DIAGNOSIS — R402141 Coma scale, eyes open, spontaneous, in the field [EMT or ambulance]: Secondary | ICD-10-CM | POA: Diagnosis present

## 2017-09-22 DIAGNOSIS — F1010 Alcohol abuse, uncomplicated: Secondary | ICD-10-CM | POA: Diagnosis present

## 2017-09-22 DIAGNOSIS — R000 Tachycardia, unspecified: Secondary | ICD-10-CM | POA: Diagnosis present

## 2017-09-22 DIAGNOSIS — Z8546 Personal history of malignant neoplasm of prostate: Secondary | ICD-10-CM | POA: Diagnosis not present

## 2017-09-22 DIAGNOSIS — J9602 Acute respiratory failure with hypercapnia: Secondary | ICD-10-CM | POA: Diagnosis not present

## 2017-09-22 DIAGNOSIS — Z9221 Personal history of antineoplastic chemotherapy: Secondary | ICD-10-CM

## 2017-09-22 DIAGNOSIS — Z590 Homelessness: Secondary | ICD-10-CM

## 2017-09-22 DIAGNOSIS — E785 Hyperlipidemia, unspecified: Secondary | ICD-10-CM | POA: Diagnosis present

## 2017-09-22 DIAGNOSIS — R402251 Coma scale, best verbal response, oriented, in the field [EMT or ambulance]: Secondary | ICD-10-CM | POA: Diagnosis present

## 2017-09-22 DIAGNOSIS — R4689 Other symptoms and signs involving appearance and behavior: Secondary | ICD-10-CM | POA: Diagnosis present

## 2017-09-22 DIAGNOSIS — C61 Malignant neoplasm of prostate: Secondary | ICD-10-CM | POA: Diagnosis present

## 2017-09-22 DIAGNOSIS — R402361 Coma scale, best motor response, obeys commands, in the field [EMT or ambulance]: Secondary | ICD-10-CM | POA: Diagnosis present

## 2017-09-22 DIAGNOSIS — R45851 Suicidal ideations: Secondary | ICD-10-CM | POA: Diagnosis present

## 2017-09-22 DIAGNOSIS — F319 Bipolar disorder, unspecified: Secondary | ICD-10-CM | POA: Diagnosis present

## 2017-09-22 DIAGNOSIS — F17200 Nicotine dependence, unspecified, uncomplicated: Secondary | ICD-10-CM | POA: Diagnosis present

## 2017-09-22 DIAGNOSIS — Z7952 Long term (current) use of systemic steroids: Secondary | ICD-10-CM

## 2017-09-22 DIAGNOSIS — Z9981 Dependence on supplemental oxygen: Secondary | ICD-10-CM | POA: Diagnosis not present

## 2017-09-22 DIAGNOSIS — T380X5A Adverse effect of glucocorticoids and synthetic analogues, initial encounter: Secondary | ICD-10-CM | POA: Diagnosis present

## 2017-09-22 DIAGNOSIS — F419 Anxiety disorder, unspecified: Secondary | ICD-10-CM | POA: Diagnosis present

## 2017-09-22 DIAGNOSIS — J441 Chronic obstructive pulmonary disease with (acute) exacerbation: Secondary | ICD-10-CM | POA: Diagnosis not present

## 2017-09-22 DIAGNOSIS — Z933 Colostomy status: Secondary | ICD-10-CM

## 2017-09-22 DIAGNOSIS — J9620 Acute and chronic respiratory failure, unspecified whether with hypoxia or hypercapnia: Secondary | ICD-10-CM | POA: Diagnosis not present

## 2017-09-22 DIAGNOSIS — F528 Other sexual dysfunction not due to a substance or known physiological condition: Secondary | ICD-10-CM | POA: Diagnosis present

## 2017-09-22 DIAGNOSIS — E099 Drug or chemical induced diabetes mellitus without complications: Secondary | ICD-10-CM | POA: Diagnosis present

## 2017-09-22 DIAGNOSIS — E0965 Drug or chemical induced diabetes mellitus with hyperglycemia: Secondary | ICD-10-CM | POA: Diagnosis present

## 2017-09-22 DIAGNOSIS — Z720 Tobacco use: Secondary | ICD-10-CM | POA: Diagnosis present

## 2017-09-22 DIAGNOSIS — J9621 Acute and chronic respiratory failure with hypoxia: Principal | ICD-10-CM | POA: Diagnosis present

## 2017-09-22 DIAGNOSIS — Z609 Problem related to social environment, unspecified: Secondary | ICD-10-CM | POA: Diagnosis present

## 2017-09-22 DIAGNOSIS — C189 Malignant neoplasm of colon, unspecified: Secondary | ICD-10-CM | POA: Diagnosis present

## 2017-09-22 DIAGNOSIS — Z885 Allergy status to narcotic agent status: Secondary | ICD-10-CM

## 2017-09-22 LAB — CBC
HCT: 40.1 % (ref 39.0–52.0)
Hemoglobin: 12.5 g/dL — ABNORMAL LOW (ref 13.0–17.0)
MCH: 30.5 pg (ref 26.0–34.0)
MCHC: 31.2 g/dL (ref 30.0–36.0)
MCV: 97.8 fL (ref 78.0–100.0)
PLATELETS: 149 10*3/uL — AB (ref 150–400)
RBC: 4.1 MIL/uL — AB (ref 4.22–5.81)
RDW: 12.7 % (ref 11.5–15.5)
WBC: 8.6 10*3/uL (ref 4.0–10.5)

## 2017-09-22 LAB — BASIC METABOLIC PANEL
Anion gap: 7 (ref 5–15)
BUN: 24 mg/dL — ABNORMAL HIGH (ref 8–23)
CALCIUM: 8.4 mg/dL — AB (ref 8.9–10.3)
CO2: 34 mmol/L — AB (ref 22–32)
CREATININE: 0.85 mg/dL (ref 0.61–1.24)
Chloride: 101 mmol/L (ref 98–111)
GFR calc non Af Amer: >60 mL/min (ref >60)
GLUCOSE: 253 mg/dL — AB (ref 70–99)
Potassium: 4.9 mmol/L (ref 3.5–5.1)
Sodium: 142 mmol/L (ref 135–145)

## 2017-09-22 LAB — GLUCOSE, CAPILLARY: GLUCOSE-CAPILLARY: 326 mg/dL — AB (ref 70–99)

## 2017-09-22 MED ORDER — MOMETASONE FURO-FORMOTEROL FUM 100-5 MCG/ACT IN AERO
2.0000 | INHALATION_SPRAY | Freq: Two times a day (BID) | RESPIRATORY_TRACT | Status: DC
  Administered 2017-09-23 – 2017-09-25 (×5): 2 via RESPIRATORY_TRACT
  Filled 2017-09-22: qty 8.8

## 2017-09-22 MED ORDER — PRAVASTATIN SODIUM 20 MG PO TABS
20.0000 mg | ORAL_TABLET | Freq: Every day | ORAL | Status: DC
  Administered 2017-09-22 – 2017-09-24 (×3): 20 mg via ORAL
  Filled 2017-09-22 (×3): qty 1

## 2017-09-22 MED ORDER — METHYLPREDNISOLONE SODIUM SUCC 125 MG IJ SOLR
125.0000 mg | Freq: Four times a day (QID) | INTRAMUSCULAR | Status: DC
  Administered 2017-09-22 – 2017-09-23 (×2): 125 mg via INTRAVENOUS
  Filled 2017-09-22 (×2): qty 2

## 2017-09-22 MED ORDER — ALBUTEROL SULFATE (2.5 MG/3ML) 0.083% IN NEBU: 5.0000 mg | INHALATION_SOLUTION | Freq: Four times a day (QID) | RESPIRATORY_TRACT | Status: DC | PRN

## 2017-09-22 MED ORDER — NICOTINE 21 MG/24HR TD PT24
21.0000 mg | MEDICATED_PATCH | Freq: Every day | TRANSDERMAL | Status: DC
  Administered 2017-09-23 – 2017-09-25 (×3): 21 mg via TRANSDERMAL
  Filled 2017-09-22 (×3): qty 1

## 2017-09-22 MED ORDER — INSULIN ASPART 100 UNIT/ML ~~LOC~~ SOLN
0.0000 [IU] | Freq: Three times a day (TID) | SUBCUTANEOUS | Status: DC
  Administered 2017-09-23: 3 [IU] via SUBCUTANEOUS
  Administered 2017-09-23: 5 [IU] via SUBCUTANEOUS
  Administered 2017-09-23 – 2017-09-24 (×4): 3 [IU] via SUBCUTANEOUS
  Administered 2017-09-25: 2 [IU] via SUBCUTANEOUS

## 2017-09-22 MED ORDER — INSULIN ASPART 100 UNIT/ML ~~LOC~~ SOLN
20.0000 [IU] | Freq: Once | SUBCUTANEOUS | Status: AC
  Administered 2017-09-22: 20 [IU] via SUBCUTANEOUS

## 2017-09-22 MED ORDER — ENOXAPARIN SODIUM 40 MG/0.4ML ~~LOC~~ SOLN
40.0000 mg | SUBCUTANEOUS | Status: DC
  Administered 2017-09-22 – 2017-09-24 (×3): 40 mg via SUBCUTANEOUS
  Filled 2017-09-22 (×3): qty 0.4

## 2017-09-22 MED ORDER — ARIPIPRAZOLE 5 MG PO TABS
5.0000 mg | ORAL_TABLET | Freq: Every day | ORAL | Status: DC
  Administered 2017-09-23 – 2017-09-25 (×3): 5 mg via ORAL
  Filled 2017-09-22 (×3): qty 1

## 2017-09-22 MED ORDER — TIOTROPIUM BROMIDE MONOHYDRATE 18 MCG IN CAPS
18.0000 ug | ORAL_CAPSULE | Freq: Every day | RESPIRATORY_TRACT | Status: DC
  Administered 2017-09-23 – 2017-09-25 (×3): 18 ug via RESPIRATORY_TRACT
  Filled 2017-09-22: qty 5

## 2017-09-22 MED ORDER — TAMSULOSIN HCL 0.4 MG PO CAPS
0.4000 mg | ORAL_CAPSULE | Freq: Every day | ORAL | Status: DC
  Administered 2017-09-24 (×2): 0.4 mg via ORAL
  Filled 2017-09-22 (×2): qty 1

## 2017-09-22 MED ORDER — SODIUM CHLORIDE 0.9 % IV SOLN
INTRAVENOUS | Status: DC
  Administered 2017-09-22: 22:00:00 via INTRAVENOUS
  Administered 2017-09-23: 1000 mL via INTRAVENOUS
  Administered 2017-09-24 – 2017-09-25 (×2): via INTRAVENOUS

## 2017-09-22 MED ORDER — LORAZEPAM 2 MG/ML IJ SOLN
1.0000 mg | INTRAMUSCULAR | Status: DC | PRN
  Administered 2017-09-22 – 2017-09-23 (×2): 1 mg via INTRAVENOUS
  Filled 2017-09-22 (×2): qty 1

## 2017-09-22 NOTE — ED Notes (Signed)
Pt states chest pain is tightness, worsening with inspiration. Has decreased from 8/10 to 6/10 after nebulized breathing treatments.

## 2017-09-22 NOTE — ED Provider Notes (Signed)
[facility]ER[facility] DEPARTMENT Provider Note   CSN: [id number] Arrival date & time: 09/22/17  1357     History   Chief Complaint Chief Complaint  Patient presents with  . Shortness of Breath  . Suicidal    HPI Taylor [name] is a 61 y.o. male with a h/o of COPD Gold on 3L home O2, asthma, bipolar 1 disorder, depression, and tobacco use disorder who presents to the emergency department by EMS with a chief complaint of dyspnea.  The patient endorses worsening dyspnea over the last week with associated chest tightness and wheezing.  He was most recently admitted for a COPD exacerbation from 8/25-8/30.   He reports that he has been using his home medications every 4 hours.  He states that this regimen includes 2 puffs of his Dulera, 2 puffs of his Proventil, as well as an albuterol nebulizer treatment. He states that he took this regimen at 8 AM and repeated all three medications at noon.  He then called EMS after 13:00 and was given 125 mg of Solu-Medrol and 2 DuoNeb treatments in route.   He states that today he attempted to walk to the office of the hotel where he has been staying, but had to sit down because he was feeling very lightheaded.  He denies syncope, headache, fever, chills, visual changes, leg swelling, nausea, vomiting, diarrhea, or abdominal pain.  He reports that last night he used all of his medications prior to going to bed and woke up diaphoretic.  He reports that he is also currently on a home regimen of prednisone and has been on it "for a long time".   He also endorses passive suicidal ideation.  He expresses concern over his chronic medical problems and states that he is "tired of it all".  He denies having a plan, homicidal ideation, or auditory or visual hallucinations.  No history of behavioral health admissions.  No history of previous suicide attempts.  The history is provided by the patient. No language interpreter was used.     Past Medical History:  Diagnosis Date  . Asthma   . Bipolar 1 disorder ([place]) 02/05/2012  . Colon cancer ([place]) 04/11/11   adenocarcinoma of colon, 7/19 nodes pos.FINISHED CHEMO/DR. SHERRILL  . COPD (chronic obstructive pulmonary disease) ([place])    SMOKER  . Depression   . Emphysema of lung ([place])   . Full dentures   . Hemorrhoids   . On home oxygen therapy    "3L; 24/7" (05/29/2017)  . Pneumonia ~ 2016   "double pneumonia"  . Prostate cancer ([place])    Gleason score = 7, supposed to have radiation therapy but he has not followed up (05/29/2017)  . Rib fractures    hx of    Patient Active Problem List   Diagnosis Date Noted  . Respiratory failure, acute and chronic ([place]) 09/22/2017  . Palliative care by specialist   . DNR (do not resuscitate)   . Chronic respiratory failure with hypoxia and hypercapnia ([place]) 09/14/2017  . Acute on chronic respiratory failure with hypoxia ([place]) 09/11/2017  . Anxiety and depression   . Prostate cancer ([place]) 09/04/2017  . Acute respiratory failure with hypercapnia ([place])   . SOB (shortness of breath)   . Malnutrition of moderate degree 06/14/2017  . Hyperglycemia 06/08/2017  . Constipation 06/08/2017  . SIRS (systemic inflammatory response syndrome) ([place]) 05/21/2017  . Tobacco abuse 05/13/2017  . HLD (hyperlipidemia) 05/13/2017  . Leukocytosis 04/06/2017  . Adjustment disorder  with depressed mood 03/28/2017  . Adjustment disorder with mixed disturbance of emotions and conduct   . Acute on chronic respiratory failure with hypercapnia ([place]) 03/24/2017  . Normocytic normochromic anemia 03/24/2017  . COPD with acute exacerbation ([place]) 10/22/2016  . Alcohol abuse 01/09/2013  . COPD exacerbation ([place]) 01/09/2013  . Smoker 12/16/2012  . Inguinal hernia unilateral, non-recurrent, right 05/01/2012  . Dyspepsia 02/05/2012  . Bipolar 1 disorder ([place]) 02/05/2012  . 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 COPD  GOLD III 02/03/2012  .  Thrombocytopenia ([place]) 02/03/2012  . Depression   . 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 COLON SURGERY  04/11/11   Sigmoid colectomy  . COLOSTOMY REVISION  04/11/2011   Procedure: COLON RESECTION SIGMOID;  Surgeon: [name], MD;  Location: [facility];  Service: General;  Laterality: N/A;  low anterior colon resection   . HERNIA REPAIR    . INGUINAL HERNIA REPAIR Right 05/01/2012   Procedure: HERNIA REPAIR INGUINAL ADULT;  Surgeon: [name], MD;  Location: [facility];  Service: General;  Laterality: Right;  . INSERTION OF MESH Right 05/01/2012   Procedure: INSERTION OF MESH;  Surgeon: [name], MD;  Location: [facility];  Service: General;  Laterality: Right;  . PORT-A-CATH REMOVAL Left 05/01/2012   Procedure: REMOVAL Infusion Port;  Surgeon: [name], MD;  Location: [facility];  Service: General;  Laterality: Left;  . PORTACATH PLACEMENT  05/02/2011   Procedure: INSERTION PORT-A-CATH;  Surgeon: [name], MD;  Location: [facility];  Service: General;  Laterality: N/A;  . PROSTATE BIOPSY          Home Medications    Prior to Admission medications   Medication Sig Start Date End Date Taking? Authorizing Provider  albuterol (PROVENTIL HFA;VENTOLIN HFA) 108 (90 Base) MCG/ACT inhaler Inhale 2 puffs into the lungs every 6 (six) hours as needed for wheezing or shortness of breath. Patient taking differently: Inhale 2 puffs into the lungs 4 (four) times daily.  08/26/17  Yes Isabelle Course, MD  albuterol (PROVENTIL) (5 MG/ML) 0.5% nebulizer solution Take 1 mL (5 mg total) by nebulization every 6 (six) hours as needed for wheezing or shortness of breath. 09/07/17  Yes Danford, Suann Larry, MD  ARIPiprazole (ABILIFY) 5 MG tablet Take 1 tablet (5 mg total) by mouth daily with breakfast. 08/27/17  Yes Isabelle Course, MD  Aspirin-Salicylamide-Caffeine [facility] HEADACHE POWDER PO) Take 1 packet by mouth as needed (for headaches).   Yes [provider]  lovastatin (MEVACOR) 20 MG tablet Take 1 tablet (20 mg total) by mouth at bedtime. 05/18/17  Yes Nita Sells, MD  mometasone-formoterol (DULERA) 100-5 MCG/ACT AERO Inhale 2 puffs into the lungs 2 (two) times daily. 05/18/17  Yes Nita Sells, MD  OXYGEN Inhale 3 L into the lungs continuous.    Yes [provider]  predniSONE (DELTASONE) 20 MG tablet Take 20 mg by mouth 2 (two) times daily.    Yes [provider]  tiotropium (SPIRIVA HANDIHALER) 18 MCG inhalation capsule Place 1 capsule (18 mcg total) into inhaler and inhale daily. 08/29/17  Yes Mesner, Corene Cornea, MD  ALPRAZolam Duanne Moron) 0.5 MG tablet Take 1 tablet (0.5 mg total) by mouth 3 (three) times daily as needed for anxiety. Patient not taking: Reported on 09/22/2017 09/12/17   Cristy Folks, MD  blood glucose meter kit and supplies KIT Dispense based on patient and insurance preference. Use  up to four times daily as directed. (FOR ICD-9 250.00, 250.01). 09/07/17   Danford, Suann Larry, MD  escitalopram (LEXAPRO) 10 MG tablet Take 1 tablet (10 mg total) by mouth daily. Patient not taking: Reported on 09/22/2017 08/27/17   Isabelle Course, MD  metFORMIN (GLUCOPHAGE) 500 MG tablet Take 1 tablet (500 mg total) by mouth 2 (two) times daily with a meal. Patient not taking: Reported on 09/22/2017 09/07/17   Edwin Dada, MD  tamsulosin (FLOMAX) 0.4 MG CAPS capsule Take 0.4 mg by mouth daily.    [provider]    Family History Family History  Problem Relation Age of Onset  . Heart disease Father   . Lung cancer Maternal Uncle        smoked    Social History Social History   Tobacco Use  . Smoking status: Former Smoker    Packs/day: 0.10    Years: 46.00    Pack years: 4.60    Types: Cigarettes    Last attempt to quit: 05/21/2017    Years since quitting: 0.3  . Smokeless tobacco: Former Systems developer    Types: Chew  Substance Use Topics  . Alcohol use: Not Currently    Comment: h/o use  in the past, no h/o heavy use  . Drug use: No     Allergies   Codeine   Review of Systems Review of Systems  Constitutional: Negative for appetite change, chills and fever.  Respiratory: Positive for shortness of breath and wheezing.   Cardiovascular: Positive for chest pain and palpitations. Negative for leg swelling.  Gastrointestinal: Negative for abdominal pain, diarrhea, nausea and vomiting.  Genitourinary: Negative for dysuria.  Musculoskeletal: Negative for back pain.  Skin: Negative for rash.  Allergic/Immunologic: Negative for immunocompromised state.  Neurological: Negative for weakness, numbness and headaches.  Psychiatric/Behavioral: Positive for suicidal ideas. Negative for confusion. The patient is nervous/anxious.     Physical Exam Updated Vital Signs BP 116/84 (BP Location: Right Arm)   Pulse (!) 129   Temp 98.2 F (36.8 C) (Oral)   Resp 18   Ht '5\' 11"'  (1.803 m)   Wt 70.7 kg   SpO2 95%   BMI 21.74 kg/m   Physical Exam  Constitutional: He appears well-developed. Nasal cannula in place.  Chronically ill appearing male.  Tremulous.  HENT:  Head: Normocephalic.  Eyes: Conjunctivae are normal.  Neck: Neck supple.  Cardiovascular: Regular rhythm. Tachycardia present.  No murmur heard. Pulmonary/Chest: Effort normal. No stridor. No respiratory distress. He has wheezes. He has no rales.  Inspiratory and expiratory wheezes bilaterally.  Lung sounds are diminished, but symmetric.  Accessory muscle use or nasal flaring.  Abdominal: Soft. He exhibits no distension and no mass. There is no tenderness. There is no rebound and no guarding. No hernia.  Neurological: He is alert.  Skin: Skin is warm and dry.  Psychiatric: His behavior is normal. He is not actively hallucinating. Thought content is not paranoid and not delusional. He expresses no suicidal plans and no homicidal plans.  Voices passive suicidal ideation. No plan.   Nursing note and vitals  reviewed.    ED Treatments / Results  Labs (all labs ordered are listed, but only abnormal results are displayed) Labs Reviewed  BASIC METABOLIC PANEL - Abnormal; Notable for the following components:      Result Value   CO2 34 (*)    Glucose, Bld 253 (*)    BUN 24 (*)    Calcium 8.4 (*)  All other components within normal limits  CBC - Abnormal; Notable for the following components:   RBC 4.10 (*)    Hemoglobin 12.5 (*)    Platelets 149 (*)    All other components within normal limits  GLUCOSE, CAPILLARY - Abnormal; Notable for the following components:   Glucose-Capillary 326 (*)    All other components within normal limits  COMPREHENSIVE METABOLIC PANEL  CBC    EKG EKG Interpretation  Date/Time:  Monday September 22 2017 14:08:40 EDT Ventricular Rate:  157 PR Interval:  88 QRS Duration: 52 QT Interval:  344 QTC Calculation: 556 R Axis:   0 Text Interpretation:  Sinus tachycardia with short PR with Premature supraventricular complexes and with frequent Premature ventricular complexes Indeterminate axis Pulmonary disease pattern Abnormal ECG No significant change since last tracing Confirmed by Dorie Rank [id number]) on 09/22/2017 2:14:14 PM   Radiology Dg Chest 2 View  Result Date: 09/22/2017 CLINICAL DATA:  Patient with shortness of breath. EXAM: CHEST - 2 VIEW COMPARISON:  Chest radiograph 09/21/2017 FINDINGS: Monitoring leads overlie the patient. Normal cardiac and mediastinal contours. Pulmonary hyperinflation. No pleural effusion or pneumothorax. Thoracic spine degenerative changes. Old right rib fractures. IMPRESSION: Pulmonary hyperinflation.  No acute cardiopulmonary process. Electronically Signed   By: Lovey Newcomer M.D.   On: 09/22/2017 15:50   Dg Chest Port 1 View  Result Date: 09/21/2017 CLINICAL DATA:  Shortness of breath and wheezing EXAM: PORTABLE CHEST 1 VIEW COMPARISON:  09/14/2017 FINDINGS: Similar hyperinflation compatible with COPD/emphysema. No focal  pneumonia, collapse or consolidation. Negative for edema, effusion or pneumothorax. Trachea midline. Normal heart size and vascularity. Monitor leads overlie the chest. Remote posterior right rib fractures. IMPRESSION: Stable hyperinflation. No interval change or superimposed acute process Electronically Signed   By: Jerilynn Mages.  Shick M.D.   On: 09/21/2017 09:14    Procedures Procedures (including critical care time)  Medications Ordered in ED Medications  LORazepam (ATIVAN) injection 1 mg (1 mg Intravenous Given 09/22/17 2138)  mometasone-formoterol (DULERA) 100-5 MCG/ACT inhaler 2 puff (2 puffs Inhalation Not Given 09/22/17 2051)  pravastatin (PRAVACHOL) tablet 20 mg (20 mg Oral Given 09/22/17 2140)  ARIPiprazole (ABILIFY) tablet 5 mg (has no administration in time range)  tiotropium (SPIRIVA) inhalation capsule 18 mcg (has no administration in time range)  albuterol (PROVENTIL) (2.5 MG/3ML) 0.083% nebulizer solution 5 mg (has no administration in time range)  tamsulosin (FLOMAX) capsule 0.4 mg (0.4 mg Oral Not Given 09/22/17 2116)  insulin aspart (novoLOG) injection 0-9 Units (has no administration in time range)  enoxaparin (LOVENOX) injection 40 mg (40 mg Subcutaneous Given 09/22/17 2136)  0.9 %  sodium chloride infusion ( Intravenous New Bag/Given 09/22/17 2148)  methylPREDNISolone sodium succinate (SOLU-MEDROL) 125 mg/2 mL injection 125 mg (125 mg Intravenous Given 09/22/17 2136)  insulin aspart (novoLOG) injection 20 Units (20 Units Subcutaneous Given 09/22/17 2137)     Initial Impression / Assessment and Plan / ED Course  I have reviewed the triage vital signs and the nursing notes.  Pertinent labs & imaging results that were available during my care of the patient were reviewed by me and considered in my medical decision making (see chart for details).     62 year old male with a h/o of COPD Gold on 3L home O2, asthma, bipolar 1 disorder, depression, and tobacco use disorder presenting by EMS with  dyspnea and chest tightness.  The patient is well-known to this ED with 26 visits in the last 6 months and 15 admissions.  He was  most recently discharged from the hospital 2 days ago, but was most recently evaluated in the ED yesterday.  On initial evaluation, the patient is tachycardic and tremulous.  Tachycardic in the 110s on arrival. I suspect this is secondary to his extensive overuse of his home long-acting and short acting corticosteroids for his COPD.  The patient endorses using his LABA maintenance inhaler multiple times throughout the day in addition to using both his albuterol nebulizer and a SABA inhaler every 4 hours.  Last home use was 1 hour prior to arrival, but he was then given 2 DuoNeb treatments and 125 mg of Solu-Medrol in route by EMS.  Chest x-ray consistent with COPD.  Labs are notable for glucose of 253, likely secondary to extensive corticosteroid use.  Bicarbonate 34, which I suspect is chronic and improved from previous as anion gap is 7.  EKG unchanged from previous.  The patient is chronically on 3L of home O2.  He was ambulated by nursing staff and had to stop approximately 20 feet into ambulation to feeling extremely short of breath. Although his SaO2 remained at 94%, nursing staff reports that he was ambulated on 4 L instead of his usual 3 L.   The patient was discussed with Dr. [name], attending physician.  The patient is expressing passive suicidal ideation regarding his chronic medical problems.  He has no plan, history of previous suicide attempts, and he denies hallucinations and HI.  No history of behavioral health inpatient stays.  Will defer TTS consult at this time.   Given worsening of acute on chronic respiratory failure, consulted the hospitalist team and spoke with Dr. Jonelle Sidle who will accept the admission. The patient appears reasonably stabilized for admission considering the current resources, flow, and capabilities available in the ED at this time, and I doubt  any other [facility] requiring further screening and/or treatment in the ED prior to admission.   Final Clinical Impressions(s) / ED Diagnoses   Final diagnoses:  COPD exacerbation [facility])    ED Discharge Orders    None       Joanne Gavel, PA-C 09/22/17 2331    Dorie Rank, MD 09/24/17 [phone]

## 2017-09-22 NOTE — ED Notes (Signed)
EKG given to Dr. Knapp. 

## 2017-09-22 NOTE — ED Notes (Signed)
Attempted report x 2 

## 2017-09-22 NOTE — H&P (Signed)
History and Physical   Taylor [name] UUE:[id number] DOB: 03/14/1955 DOA: 09/22/2017  Referring MD/NP/PA: Dr. Marye Round  PCP: Freddie Breech, [facility]   Outpatient Specialists: None  Patient coming from: Home  Chief Complaint: Shortness of breath and anxiety  HPI: Taylor [name] is a 62 y.o. male with medical history significant of bipolar disorder, advanced COPD on home oxygen typically 2 L/min, depression, tobacco abuse,Colon and prostate cancer who was recently discharged from the hospital with exacerbation of COPD.  Since discharge he has been having significant tightness of his chest.  He left on August 30 and has not been able to breathe normally.  He reported tightening of his chest despite using his inhalers.  He has been doubling on his inhalers including Dulera and albuterol.  He took also some steroids but still not feeling better.  EMS was called where he was given DuoNeb and Solu-Medrol in route.  He also describes exertional dyspnea and cough.  Patient was notably tachycardic on arrival in the ER.  He is frustrated voicing passive suicidal ideation but no active plan.  On further questioning patient says he is just frustrated but not suicidal.  Patient appears to be hypoxic with also concomitant tachycardia.  Is being readmitted with a diagnosis of acute exacerbation of COPD.  ED Course: Patient's temperature is 97.3 with blood pressure 147/88 his pulse is persistently 129 with oxygen sat of 96% respiratory rate of 31.  His electrolytes essentially normal except for CO2 of 34 and blood sugar of 253.  Hemoglobin is 12.5 with normal white count platelets 149.  Chest x-ray showed no active findings.  EKG showed sinus tachycardia with nonspecific ST changes   Review of Systems: As per HPI otherwise 10 point review of systems negative.    Past Medical History:  Diagnosis Date  . Asthma   . Bipolar 1 disorder ([place]) 02/05/2012  . Colon cancer ([place]) 04/11/11   adenocarcinoma of colon, 7/19 nodes pos.FINISHED CHEMO/DR. SHERRILL  . COPD (chronic obstructive pulmonary disease) ([place])    SMOKER  . Depression   . Emphysema of lung ([place])   . Full dentures   . Hemorrhoids   . On home oxygen therapy    "3L; 24/7" (05/29/2017)  . Pneumonia ~ 2016   "double pneumonia"  . Prostate cancer ([place])    Gleason score = 7, supposed to have radiation therapy but he has not followed up (05/29/2017)  . Rib fractures    hx of    Past Surgical History:  Procedure Laterality Date  . COLON SURGERY  04/11/11   Sigmoid colectomy  . COLOSTOMY REVISION  04/11/2011   Procedure: COLON RESECTION SIGMOID;  Surgeon: [name], MD;  Location: [facility];  Service: General;  Laterality: N/A;  low anterior colon resection   . HERNIA REPAIR    . INGUINAL HERNIA REPAIR Right 05/01/2012   Procedure: HERNIA REPAIR INGUINAL ADULT;  Surgeon: [name], MD;  Location: [facility];  Service: General;  Laterality: Right;  . INSERTION OF MESH Right 05/01/2012   Procedure: INSERTION OF MESH;  Surgeon: [name], MD;  Location: [facility];  Service: General;  Laterality: Right;  . PORT-A-CATH REMOVAL Left 05/01/2012   Procedure: REMOVAL Infusion Port;  Surgeon: [name], MD;  Location: [facility];  Service: General;  Laterality: Left;  . PORTACATH PLACEMENT  05/02/2011   Procedure: INSERTION PORT-A-CATH;  Surgeon: [name], MD;  Location: [facility];  Service: General;  Laterality:  N/A;  . PROSTATE BIOPSY       reports that he quit smoking about 4 months ago. His smoking use included cigarettes. He has a 4.60 pack-year smoking history. He has quit using smokeless tobacco.  His smokeless tobacco use included chew. He reports that he drank alcohol. He reports that he does not use drugs.  Allergies  Allergen Reactions  . Codeine Nausea And Vomiting    Family History  Problem Relation Age of Onset  . Heart disease Father   . Lung cancer Maternal Uncle        smoked     Prior to  Admission medications   Medication Sig Start Date End Date Taking? Authorizing Provider  albuterol (PROVENTIL HFA;VENTOLIN HFA) 108 (90 Base) MCG/ACT inhaler Inhale 2 puffs into the lungs every 6 (six) hours as needed for wheezing or shortness of breath. Patient taking differently: Inhale 2 puffs into the lungs 4 (four) times daily.  08/26/17  Yes Isabelle Course, MD  albuterol (PROVENTIL) (5 MG/ML) 0.5% nebulizer solution Take 1 mL (5 mg total) by nebulization every 6 (six) hours as needed for wheezing or shortness of breath. 09/07/17  Yes Danford, Suann Larry, MD  ARIPiprazole (ABILIFY) 5 MG tablet Take 1 tablet (5 mg total) by mouth daily with breakfast. 08/27/17  Yes Isabelle Course, MD  Aspirin-Salicylamide-[facility] HEADACHE POWDER PO) Take 1 packet by mouth as needed (for headaches).   Yes [provider]  lovastatin (MEVACOR) 20 MG tablet Take 1 tablet (20 mg total) by mouth at bedtime. 05/18/17  Yes Nita Sells, MD  mometasone-formoterol (DULERA) 100-5 MCG/ACT AERO Inhale 2 puffs into the lungs 2 (two) times daily. 05/18/17  Yes Nita Sells, MD  OXYGEN Inhale 3 L into the lungs continuous.    Yes [provider]  predniSONE (DELTASONE) 20 MG tablet Take 20 mg by mouth 2 (two) times daily.    Yes [provider]  tiotropium (SPIRIVA HANDIHALER) 18 MCG inhalation capsule Place 1 capsule (18 mcg total) into inhaler and inhale daily. 08/29/17  Yes Mesner, Corene Cornea, MD  ALPRAZolam Duanne Moron) 0.5 MG tablet Take 1 tablet (0.5 mg total) by mouth 3 (three) times daily as needed for anxiety. Patient not taking: Reported on 09/22/2017 09/12/17   Cristy Folks, MD  blood glucose meter kit and supplies KIT Dispense based on patient and insurance preference. Use up to four times daily as directed. (FOR ICD-9 250.00, 250.01). 09/07/17   Danford, Suann Larry, MD  escitalopram (LEXAPRO) 10 MG tablet Take 1 tablet (10 mg total) by mouth daily. Patient not taking: Reported on  09/22/2017 08/27/17   Isabelle Course, MD  metFORMIN (GLUCOPHAGE) 500 MG tablet Take 1 tablet (500 mg total) by mouth 2 (two) times daily with a meal. Patient not taking: Reported on 09/22/2017 09/07/17   Edwin Dada, MD  tamsulosin (FLOMAX) 0.4 MG CAPS capsule Take 0.4 mg by mouth daily.    [provider]    Physical Exam: Vitals:   09/22/17 1845 09/22/17 1909 09/22/17 1915 09/22/17 2015  BP:   (!) 109/96 130/66  Pulse:   (!) 127 (!) 104  Resp:  (!) 24 (!) 29 20  Temp:      TempSrc:      SpO2: 98% 99% 100% 99%  Weight:      Height:          Constitutional: NAD, calm, comfortable Vitals:   09/22/17 1845 09/22/17 1909 09/22/17 1915 09/22/17 2015  BP:   Marland Kitchen)  109/96 130/66  Pulse:   (!) 127 (!) 104  Resp:  (!) 24 (!) 29 20  Temp:      TempSrc:      SpO2: 98% 99% 100% 99%  Weight:      Height:       Eyes: PERRL, lids and conjunctivae normal ENMT: Mucous membranes are moist. Posterior pharynx clear of any exudate or lesions.Normal dentition.  Neck: normal, supple, no masses, no thyromegaly Respiratory: Decreased air entry bilaterally, marked expiratory wheezing no crackles. No accessory muscle use.  Cardiovascular: Sinus tachycardia with no gallops ,no extremity edema. 2+ pedal pulses. No carotid bruits.  Abdomen: no tenderness, no masses palpated. No hepatosplenomegaly. Bowel sounds positive.  Musculoskeletal: no clubbing / cyanosis. No joint deformity upper and lower extremities. Good ROM, no contractures. Normal muscle tone.  Skin: no rashes, lesions, ulcers. No induration Neurologic: CN 2-12 grossly intact. Sensation intact, DTR normal. Strength 5/5 in all 4.  Psychiatric: Very anxious, not suicidal at the moment.  Normal judgment and insight. Alert and oriented x 3. Normal mood.     Labs on Admission: I have personally reviewed following labs and imaging studies  CBC: Recent Labs  Lab 09/21/17 0835 09/22/17 1530  WBC 12.8* 8.6  HGB 14.1 12.5*  HCT  45.0 40.1  MCV 97.0 97.8  PLT 180 939*   Basic Metabolic Panel: Recent Labs  Lab 09/18/17 0425 09/21/17 0835 09/22/17 1530  NA 138 140 142  K 4.0 4.0 4.9  CL 95* 101 101  CO2 36* 31 34*  GLUCOSE 150* 222* 253*  BUN 22 14 24*  CREATININE 0.72 0.78 0.85  CALCIUM 8.4* 8.5* 8.4*  MG 2.3  --   --    GFR: Estimated Creatinine Clearance: 90.1 mL/min (by C-G formula based on SCr of 0.85 mg/dL). Liver Function Tests: No results for input(s): AST, ALT, ALKPHOS, BILITOT, PROT, ALBUMIN in the last 168 hours. No results for input(s): LIPASE, AMYLASE in the last 168 hours. No results for input(s): AMMONIA in the last 168 hours. Coagulation Profile: No results for input(s): INR, PROTIME in the last 168 hours. Cardiac Enzymes: No results for input(s): CKTOTAL, CKMB, CKMBINDEX, TROPONINI in the last 168 hours. BNP (last 3 results) No results for input(s): PROBNP in the last 8760 hours. HbA1C: No results for input(s): HGBA1C in the last 72 hours. CBG: Recent Labs  Lab 09/18/17 1202 09/18/17 1747 09/18/17 2017 09/19/17 0729 09/19/17 1104  GLUCAP 241* 244* 213* 151* 178*   Lipid Profile: No results for input(s): CHOL, HDL, LDLCALC, TRIG, CHOLHDL, LDLDIRECT in the last 72 hours. Thyroid Function Tests: No results for input(s): TSH, T4TOTAL, FREET4, T3FREE, THYROIDAB in the last 72 hours. Anemia Panel: No results for input(s): VITAMINB12, FOLATE, FERRITIN, TIBC, IRON, RETICCTPCT in the last 72 hours. Urine analysis:    Component Value Date/Time   COLORURINE YELLOW 06/13/2017 0203   APPEARANCEUR CLEAR 06/13/2017 0203   LABSPEC 1.018 06/13/2017 0203   PHURINE 6.0 06/13/2017 0203   GLUCOSEU 50 (A) 06/13/2017 0203   HGBUR NEGATIVE 06/13/2017 0203   BILIRUBINUR NEGATIVE 06/13/2017 0203   KETONESUR NEGATIVE 06/13/2017 0203   PROTEINUR NEGATIVE 06/13/2017 0203   UROBILINOGEN 1.0 01/09/2013 1843   NITRITE NEGATIVE 06/13/2017 0203   LEUKOCYTESUR NEGATIVE 06/13/2017 0203   Sepsis  Labs: _0 (procalcitonin:4,lacticidven:4) )No results found for this or any previous visit (from the past 240 hour(s)).   Radiological Exams on Admission: Dg Chest 2 View  Result Date: 09/22/2017 CLINICAL DATA:  Patient with shortness of  breath. EXAM: CHEST - 2 VIEW COMPARISON:  Chest radiograph 09/21/2017 FINDINGS: Monitoring leads overlie the patient. Normal cardiac and mediastinal contours. Pulmonary hyperinflation. No pleural effusion or pneumothorax. Thoracic spine degenerative changes. Old right rib fractures. IMPRESSION: Pulmonary hyperinflation.  No acute cardiopulmonary process. Electronically Signed   By: Lovey Newcomer M.D.   On: 09/22/2017 15:50   Dg Chest Port 1 View  Result Date: 09/21/2017 CLINICAL DATA:  Shortness of breath and wheezing EXAM: PORTABLE CHEST 1 VIEW COMPARISON:  09/14/2017 FINDINGS: Similar hyperinflation compatible with COPD/emphysema. No focal pneumonia, collapse or consolidation. Negative for edema, effusion or pneumothorax. Trachea midline. Normal heart size and vascularity. Monitor leads overlie the chest. Remote posterior right rib fractures. IMPRESSION: Stable hyperinflation. No interval change or superimposed acute process Electronically Signed   By: Jerilynn Mages.  Shick M.D.   On: 09/21/2017 09:14    EKG: Independently reviewed.  Sinus tachycardia with poor R wave progression and general pulmonary pattern  Assessment/Plan Principal Problem:   Acute respiratory failure with hypercapnia (HCC) Active Problems:   Colon cancer, sigmoid   COPD  GOLD III   Steroid-induced diabetes mellitus (HCC)   Bipolar 1 disorder (HCC)   Alcohol abuse   COPD exacerbation (HCC)   Tobacco abuse   Prostate cancer ([place])   Respiratory failure, acute and chronic ([place])     #1 acute on chronic respiratory failure: Secondary to COPD exacerbation most likely.  Patient is also very anxious.  He has a recent CT angiogram of the chest in August of this year that showed no evidence of PE.   It appears this is mainly COPD with exacerbation.  Admit the patient was treatment following the gold protocol.  IV Solu-Medrol, nebulizer with antibiotics.  Oxygen demand is up to 3 L now from his baseline of 2 L.  Continue oxygenation and titrate.  #2 bipolar disorder: Patient is very anxious at this point.  He earlier reportedly voiced suicidal ideation but not at this point.  Will observe patient closely and continue with his home regimen of anxiolytics  #3 history of alcohol abuse: Patient denied any recent alcohol use.  We will watch him for any signs of alcohol withdrawal and if so we will initiate CIWA protocol  #4 tobacco abuse: Counseling provided and will initiate nicotine patch  #5 diabetes: Blood sugar is already uncontrolled.  With patient's steroid use the blood sugar may likely get worse.  We will initial sliding scale insulin in addition to home regimen.   DVT prophylaxis: Lovenox Code Status: Full code Family Communication: No family available Disposition Plan: To be determined Consults called: None Admission status: In  Severity of Illness: The appropriate patient status for this patient is INPATIENT. Inpatient status is judged to be reasonable and necessary in order to provide the required intensity of service to ensure the patient's safety. The patient's presenting symptoms, physical exam findings, and initial radiographic and laboratory data in the context of their chronic comorbidities is felt to place them at high risk for further clinical deterioration. Furthermore, it is not anticipated that the patient will be medically stable for discharge from the hospital within 2 midnights of admission. The following factors support the patient status of inpatient.   " The patient's presenting symptoms include shortness of breath and wheezing. " The worrisome physical exam findings include bilateral expiratory wheezing. " The initial radiographic and laboratory data are worrisome  because of no evidence of pneumonia. " The chronic co-morbidities include COPD Gold with ongoing tobacco abuse.   *  I certify that at the point of admission it is my clinical judgment that the patient will require inpatient hospital care spanning beyond 2 midnights from the point of admission due to high intensity of service, high risk for further deterioration and high frequency of surveillance required.Barbette Merino MD [facility] Pager [phone]  If 7PM-7AM, please contact night-coverage [email or web address] Password [facility]  09/22/2017, 8:33 PM

## 2017-09-22 NOTE — Progress Notes (Signed)
Patient's CBG=326 mg/dL and no night time sliding scale. Paged Dr. Jonelle Sidle paged and he gave one time dose of 20 units of Novolg. Will administer and continue to monitor.

## 2017-09-22 NOTE — ED Notes (Signed)
Attempted report 

## 2017-09-22 NOTE — ED Notes (Signed)
ED Provider at bedside. 

## 2017-09-22 NOTE — ED Triage Notes (Signed)
Pt arrives to ED from hotel with complaints of shortness of breath and suicidal ideation, recurrent for 1 week, seen for same in past. EMS reports pt did home neb tx at hotel with no relief, EMS gave additional 2 breathing tx en route, pt anxious upon arrival to ED. Pt placed in position of comfort with bed locked and lowered, call bell in reach.

## 2017-09-22 NOTE — ED Notes (Signed)
Pt ambulatory with pulse ox and on 4L of O2 via [place]. Pt had to sit after approx 20 feet to catch his breath, sats remained 94%, but pt states "I just don't know what to do, I can't go back to that motel like this, standing outside won't nobody call an ambulance and I ain't got no phone. I just can't go on like this, I might as well lay in the street and die."  Pt still very tremulous, ambulatory with assistance. Able to ambulate back to bed after halfway point break.

## 2017-09-23 DIAGNOSIS — R4689 Other symptoms and signs involving appearance and behavior: Secondary | ICD-10-CM | POA: Diagnosis present

## 2017-09-23 DIAGNOSIS — J9622 Acute and chronic respiratory failure with hypercapnia: Secondary | ICD-10-CM

## 2017-09-23 DIAGNOSIS — J9621 Acute and chronic respiratory failure with hypoxia: Principal | ICD-10-CM

## 2017-09-23 LAB — CBC
HEMATOCRIT: 38.5 % — AB (ref 39.0–52.0)
Hemoglobin: 12 g/dL — ABNORMAL LOW (ref 13.0–17.0)
MCH: 30.5 pg (ref 26.0–34.0)
MCHC: 31.2 g/dL (ref 30.0–36.0)
MCV: 98 fL (ref 78.0–100.0)
Platelets: 153 10*3/uL (ref 150–400)
RBC: 3.93 MIL/uL — ABNORMAL LOW (ref 4.22–5.81)
RDW: 12.6 % (ref 11.5–15.5)
WBC: 8.8 10*3/uL (ref 4.0–10.5)

## 2017-09-23 LAB — COMPREHENSIVE METABOLIC PANEL
ALBUMIN: 3.2 g/dL — AB (ref 3.5–5.0)
ALT: 37 U/L (ref 0–44)
AST: 17 U/L (ref 15–41)
Alkaline Phosphatase: 44 U/L (ref 38–126)
Anion gap: 5 (ref 5–15)
BUN: 22 mg/dL (ref 8–23)
CHLORIDE: 101 mmol/L (ref 98–111)
CO2: 36 mmol/L — ABNORMAL HIGH (ref 22–32)
Calcium: 8.5 mg/dL — ABNORMAL LOW (ref 8.9–10.3)
Creatinine, Ser: 0.72 mg/dL (ref 0.61–1.24)
GFR calc Af Amer: >60 mL/min (ref >60)
GFR calc non Af Amer: >60 mL/min (ref >60)
GLUCOSE: 218 mg/dL — AB (ref 70–99)
Potassium: 4.3 mmol/L (ref 3.5–5.1)
SODIUM: 142 mmol/L (ref 135–145)
Total Bilirubin: 0.5 mg/dL (ref 0.3–1.2)
Total Protein: 5.1 g/dL — ABNORMAL LOW (ref 6.5–8.1)

## 2017-09-23 LAB — GLUCOSE, CAPILLARY
GLUCOSE-CAPILLARY: 216 mg/dL — AB (ref 70–99)
Glucose-Capillary: 211 mg/dL — ABNORMAL HIGH (ref 70–99)
Glucose-Capillary: 293 mg/dL — ABNORMAL HIGH (ref 70–99)
Glucose-Capillary: 236 mg/dL — ABNORMAL HIGH (ref 70–99)

## 2017-09-23 MED ORDER — GUAIFENESIN ER 600 MG PO TB12
600.0000 mg | ORAL_TABLET | Freq: Two times a day (BID) | ORAL | Status: DC
  Administered 2017-09-23 – 2017-09-25 (×5): 600 mg via ORAL
  Filled 2017-09-23 (×5): qty 1

## 2017-09-23 MED ORDER — DOXYCYCLINE HYCLATE 100 MG PO TABS
100.0000 mg | ORAL_TABLET | Freq: Two times a day (BID) | ORAL | Status: DC
  Administered 2017-09-23 – 2017-09-25 (×5): 100 mg via ORAL
  Filled 2017-09-23 (×5): qty 1

## 2017-09-23 MED ORDER — VITAMIN B-1 100 MG PO TABS
100.0000 mg | ORAL_TABLET | Freq: Every day | ORAL | Status: DC
  Administered 2017-09-23 – 2017-09-25 (×3): 100 mg via ORAL
  Filled 2017-09-23 (×3): qty 1

## 2017-09-23 MED ORDER — IPRATROPIUM-ALBUTEROL 0.5-2.5 (3) MG/3ML IN SOLN
3.0000 mL | Freq: Three times a day (TID) | RESPIRATORY_TRACT | Status: DC
  Administered 2017-09-23 – 2017-09-24 (×4): 3 mL via RESPIRATORY_TRACT
  Filled 2017-09-23 (×4): qty 3

## 2017-09-23 MED ORDER — LORAZEPAM 1 MG PO TABS
1.0000 mg | ORAL_TABLET | Freq: Four times a day (QID) | ORAL | Status: DC | PRN
  Administered 2017-09-23 – 2017-09-25 (×5): 1 mg via ORAL
  Filled 2017-09-23 (×5): qty 1

## 2017-09-23 MED ORDER — FOLIC ACID 1 MG PO TABS
1.0000 mg | ORAL_TABLET | Freq: Every day | ORAL | Status: DC
  Administered 2017-09-23 – 2017-09-25 (×3): 1 mg via ORAL
  Filled 2017-09-23 (×3): qty 1

## 2017-09-23 MED ORDER — ADULT MULTIVITAMIN W/MINERALS CH
1.0000 | ORAL_TABLET | Freq: Every day | ORAL | Status: DC
  Administered 2017-09-23 – 2017-09-25 (×3): 1 via ORAL
  Filled 2017-09-23 (×3): qty 1

## 2017-09-23 MED ORDER — ESCITALOPRAM OXALATE 10 MG PO TABS
10.0000 mg | ORAL_TABLET | Freq: Every day | ORAL | Status: DC
  Administered 2017-09-23 – 2017-09-25 (×3): 10 mg via ORAL
  Filled 2017-09-23 (×3): qty 1

## 2017-09-23 MED ORDER — METHYLPREDNISOLONE SODIUM SUCC 40 MG IJ SOLR
40.0000 mg | Freq: Three times a day (TID) | INTRAMUSCULAR | Status: DC
  Administered 2017-09-23 – 2017-09-24 (×4): 40 mg via INTRAVENOUS
  Filled 2017-09-23 (×5): qty 1

## 2017-09-23 MED ORDER — LORAZEPAM 2 MG/ML IJ SOLN: 1.0000 mg | Freq: Four times a day (QID) | INTRAMUSCULAR | Status: DC | PRN

## 2017-09-23 NOTE — Progress Notes (Signed)
Patient admitted with acute respiratory failure and hypercapnia. Alert and oriented x 4. Patient oriented to his room, staff,ascom/call bell. Full assessment to epic completed. Will continue to monitor.
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
## 2017-09-24 DIAGNOSIS — F1010 Alcohol abuse, uncomplicated: Secondary | ICD-10-CM

## 2017-09-24 DIAGNOSIS — J9620 Acute and chronic respiratory failure, unspecified whether with hypoxia or hypercapnia: Secondary | ICD-10-CM

## 2017-09-24 DIAGNOSIS — F319 Bipolar disorder, unspecified: Secondary | ICD-10-CM

## 2017-09-24 LAB — GLUCOSE, CAPILLARY
GLUCOSE-CAPILLARY: 216 mg/dL — AB (ref 70–99)
GLUCOSE-CAPILLARY: 208 mg/dL — AB (ref 70–99)
Glucose-Capillary: 230 mg/dL — ABNORMAL HIGH (ref 70–99)
Glucose-Capillary: 126 mg/dL — ABNORMAL HIGH (ref 70–99)

## 2017-09-24 MED ORDER — PREDNISONE 20 MG PO TABS
40.0000 mg | ORAL_TABLET | Freq: Every day | ORAL | Status: DC
  Administered 2017-09-25: 40 mg via ORAL
  Filled 2017-09-24: qty 2

## 2017-09-24 MED ORDER — IPRATROPIUM-ALBUTEROL 0.5-2.5 (3) MG/3ML IN SOLN: 3.0000 mL | Freq: Four times a day (QID) | RESPIRATORY_TRACT | Status: DC | PRN

## 2017-09-24 MED ORDER — PREDNISONE 50 MG PO TABS: 50.0000 mg | ORAL_TABLET | Freq: Every day | ORAL | Status: DC
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
## 2017-09-24 NOTE — Progress Notes (Signed)
Inpatient Diabetes Program Recommendations  AACE/ADA: New Consensus Statement on Inpatient Glycemic Control (2015)  Target Ranges:  Prepandial:   less than 140 mg/dL      Peak postprandial:   less than 180 mg/dL (1-2 hours)      Critically ill patients:  140 - 180 mg/dL   Lab Results  Component Value Date   GLUCAP 216 (H) 09/24/2017   HGBA1C 7.2 (H) 09/06/2017    Review of Glycemic Control  Diabetes history: DM2 Outpatient Diabetes medications: metformin 500 mg bid (not taking) Current orders for Inpatient glycemic control: Novolog 0-9 units tidwc  On Solumedrol 40 units Q8H.  Inpatient Diabetes Program Recommendations:     Increase Novolog to 0-15 units tidwc and hs  Will continue to follow.  Thank you. Lorenda Peck, RD, LDN, CDE Inpatient Diabetes Coordinator [phone]

## 2017-09-24 NOTE — Progress Notes (Signed)
CSW received consult regarding housing resources. Patient reported that he is currently living in a hotel but would like someplace more permanent and affordable. CSWs provided housing resources for patient to follow up on. Patient reported that he is feeling better and asked about his oxygen at discharge. CSW notified him that [facility] would assist him with that at discharge.  CSW signing off. Please re-consult if further needs arise.   Percell Locus Vertie Dibbern LCSW [phone]

## 2017-09-25 DIAGNOSIS — J441 Chronic obstructive pulmonary disease with (acute) exacerbation: Secondary | ICD-10-CM

## 2017-09-25 LAB — GLUCOSE, CAPILLARY
Glucose-Capillary: 153 mg/dL — ABNORMAL HIGH (ref 70–99)
Glucose-Capillary: 181 mg/dL — ABNORMAL HIGH (ref 70–99)

## 2017-09-25 MED ORDER — THIAMINE HCL 100 MG PO TABS: 100.0000 mg | ORAL_TABLET | Freq: Every day | ORAL | 1 refills | Status: DC

## 2017-09-25 MED ORDER — GUAIFENESIN ER 600 MG PO TB12: 600.0000 mg | ORAL_TABLET | Freq: Two times a day (BID) | ORAL | 0 refills | Status: DC

## 2017-09-25 MED ORDER — FOLIC ACID 1 MG PO TABS: 1.0000 mg | ORAL_TABLET | Freq: Every day | ORAL | 1 refills | Status: DC

## 2017-09-25 MED ORDER — DOXYCYCLINE HYCLATE 100 MG PO TABS: 100.0000 mg | ORAL_TABLET | Freq: Two times a day (BID) | ORAL | 0 refills | Status: DC

## 2017-09-25 MED ORDER — NICOTINE 21 MG/24HR TD PT24: 21.0000 mg | MEDICATED_PATCH | Freq: Every day | TRANSDERMAL | 0 refills | Status: DC
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
## 2017-09-25 NOTE — Evaluation (Signed)
Physical Therapy Evaluation Patient Details Name: Taylor [name] MRN: [id number] DOB: 1955/12/03 Today's Date: 09/25/2017   History of Present Illness  Patient is a 62 y/o male presenting to the ED on 09/22/17 with primary complaints of SOB and anxiety. PMH significant for bipolar disorder, advanced COPD on home oxygen typically 2 L/min, depression, tobacco abuse,Colon and prostate cancer who was recently discharged from the hospital with exacerbation of COPD. Chest x-ray showed no active findings.    Clinical Impression  Patient admitted with the above listed diagnosis. Patient reports that prior to admission he was independent with mobility with use of 3L supplemental O2. Patient today requiring general min guard to supervision for mobility. O2 sats remaining at 94% with mobility, however with pace of gait slowed to reduce SOB. Will recommend HHPT vs OPPT at discharge to improve activity tolerance and endurance. Will follow acutely to facilitate d/c to venue listed below.     Follow Up Recommendations Home health PT;Outpatient PT(HH vs OP - depending on transportation)    Equipment Recommendations  None recommended by PT    Recommendations for Other Services       Precautions / Restrictions Precautions Precautions: Fall Precaution Comments: watch O2 sat Restrictions Weight Bearing Restrictions: No      Mobility  Bed Mobility Overal bed mobility: Modified Independent Bed Mobility: Supine to Sit     Supine to sit: Modified independent (Device/Increase time)     General bed mobility comments: increased effort and time to reduce SOB  Transfers Overall transfer level: Needs assistance Equipment used: None Transfers: Sit to/from Stand Sit to Stand: Min guard;Supervision         General transfer comment: for safety and immediate standing balance  Ambulation/Gait Ambulation/Gait assistance: Min guard;Supervision Gait Distance (Feet): 200 Feet Assistive device:  None Gait Pattern/deviations: Step-through pattern;Decreased stride length;Trunk flexed;Narrow base of support Gait velocity: decreased   General Gait Details: mild instability - slow pace of gait to reduce SOB. 3L O2 with SpO2 94%, HR 110  Stairs            Wheelchair Mobility    Modified Rankin (Stroke Patients Only)       Balance Overall balance assessment: Needs assistance Sitting-balance support: Feet supported Sitting balance-Leahy Scale: Good     Standing balance support: Single extremity supported;During functional activity Standing balance-Leahy Scale: Good                               Pertinent Vitals/Pain Pain Assessment: No/denies pain    Home Living Family/patient expects to be discharged to:: [facility]) Living Arrangements: Alone               Additional Comments: patient reports living in [place] - has been "bouncing around" to different housing venues    Prior Function Level of Independence: Independent         Comments: 3L home O2. Limited to short ambulation distance secondary to SOB     Hand Dominance        Extremity/Trunk Assessment        Lower Extremity Assessment Lower Extremity Assessment: Overall WFL for tasks assessed    Cervical / Trunk Assessment Cervical / Trunk Assessment: Other exceptions Cervical / Trunk Exceptions: rounded shoulders and forward head.   Communication   Communication: No difficulties  Cognition Arousal/Alertness: Awake/alert Behavior During Therapy: WFL for tasks assessed/performed Overall Cognitive Status: Within Functional Limits for tasks assessed  General Comments      Exercises     Assessment/Plan    PT Assessment Patient needs continued PT services  PT Problem List Cardiopulmonary status limiting activity;Decreased activity tolerance;Decreased mobility;Decreased strength       PT Treatment Interventions DME  instruction;Gait training;Functional mobility training;Therapeutic activities;Balance training;Therapeutic exercise;Patient/family education    PT Goals (Current goals can be found in the Care Plan section)  Acute Rehab PT Goals Patient Stated Goal: none stated  PT Goal Formulation: With patient Time For Goal Achievement: 10/09/17 Potential to Achieve Goals: Fair    Frequency Min 3X/week   Barriers to discharge        Co-evaluation               AM-PAC PT "6 Clicks" Daily Activity  Outcome Measure Difficulty turning over in bed (including adjusting bedclothes, sheets and blankets)?: None Difficulty moving from lying on back to sitting on the side of the bed? : None Difficulty sitting down on and standing up from a chair with arms (e.g., wheelchair, bedside commode, etc,.)?: A Little Help needed moving to and from a bed to chair (including a wheelchair)?: A Little Help needed walking in hospital room?: A Little Help needed climbing 3-5 steps with a railing? : A Little 6 Click Score: 20    End of Session Equipment Utilized During Treatment: Gait belt;Oxygen(3L) Activity Tolerance: Patient tolerated treatment well Patient left: in chair;with call bell/phone within reach Nurse Communication: Mobility status PT Visit Diagnosis: Other abnormalities of gait and mobility (R26.89)    Time: 6789-3810 PT Time Calculation (min) (ACUTE ONLY): 25 min   Charges:   PT Evaluation $PT Eval Moderate Complexity: 1 Mod PT Treatments $Gait Training: 8-22 mins      Lanney Gins, PT, DPT 09/25/17 9:45 AM Pager: [phone]

## 2017-09-25 NOTE — Care Management Note (Addendum)
Case Management Note  Patient Details  Name: Taylor [name] MRN: [id number] Date of Birth: 30-Jun-1955  Subjective/Objective:       Presented with complaints of SOB and anxiety, hx of bipolar disorder, advanced COPD on home oxygen 2 L/min/ [place] , depression, tobacco abuse,Colon and prostate cancer who was recently discharged from the hospital with exacerbation of COPD.                     [name] (Sister) Carrolyn Meiers [phone]     PCP: Georga Hacking   Action/Plan: Transition to friend's home when medically stable. Pt will need PTAR services called and arranged for transportation to home. Pt states Lanny Hurst ( friend) is who he will live with once discharged, [address] [place] [phone]. Pt states Lanny Hurst is to pick up oxygen concentrators from [facility] and take to his home prior to pt's discharge.  Pt declined home health services per PT's recommendations.  Expected Discharge Date:    09/25/2017     Expected Discharge Plan:   home with self care  In-House Referral:  Clinical Social Work  Discharge planning Services  CM Consult  Post Acute Care Choice:  Durable Medical Equipment(oxygen) Choice offered to:  Patient  DME Arranged:    DME Agency:  [facility](active with agency for oxygen)  HH Arranged:   declined home health services [place] Agency:     Status of Service:   completed  If discussed at Long Length of Stay Meetings, dates discussed:    Additional Comments:  Sharin Mons, RN 09/25/2017, 11:18 AM

## 2017-09-25 NOTE — Evaluation (Signed)
Occupational Therapy Evaluation Patient Details Name: Taylor [name] MRN: [id number] DOB: Sep 01, 1955 Today's Date: 09/25/2017    History of Present Illness Patient is a 62 y/o male presenting to the ED on 09/22/17 with primary complaints of SOB and anxiety. PMH significant for bipolar disorder, advanced COPD on home oxygen typically 2 L/min, depression, tobacco abuse,Colon and prostate cancer who was recently discharged from the hospital with exacerbation of COPD. Chest x-ray showed no active findings.   Clinical Impression   PTA patient independent with ADLs, limited IADLs and mobility due to SOB and decreased activity tolerance.  He currently demonstrates ability to complete full body dressing, bathing, toilet transfers with supervision, toileting with modified independence.  Significantly limited by decreased activity tolerance, generalized weakness, SOB, DOE, and anxiety.  Completes pursed lip breathing throughout session, and oxygen saturations maintained >92% on 3L supplemental oxygen via [place]. Educated on energy conservation techniques, provided handout, and reviewed safety precautions.  Based on performance today, patient will benefit from continued OT services while admitted and [place] after dc in order to optimize safety, independence, and activity tolerance to daily activities. Will continue to follow.     Follow Up Recommendations  Home health OT    Equipment Recommendations  Other (comment)(TBD, unsure of dc location )    Recommendations for Other Services       Precautions / Restrictions Precautions Precautions: Fall Precaution Comments: watch O2 sat Restrictions Weight Bearing Restrictions: No      Mobility Bed Mobility               General bed mobility comments: seated in recliner upon entry  Transfers Overall transfer level: Needs assistance Equipment used: None Transfers: Sit to/from Stand Sit to Stand: Supervision         General transfer comment:  supervision for safety    Balance Overall balance assessment: Needs assistance Sitting-balance support: Feet supported Sitting balance-Leahy Scale: Good     Standing balance support: No upper extremity supported;During functional activity Standing balance-Leahy Scale: Good                             ADL either performed or assessed with clinical judgement   ADL Overall ADL's : Needs assistance/impaired     Grooming: Supervision/safety;Standing Grooming Details (indicate cue type and reason): standing at sink with supervision, increased SOB  Upper Body Bathing: Supervision/ safety;Sitting   Lower Body Bathing: Supervison/ safety;Sit to/from stand   Upper Body Dressing : Supervision/safety;Sitting   Lower Body Dressing: Set up;Sit to/from stand Lower Body Dressing Details (indicate cue type and reason): reports donning LB clothing with increased time  Toilet Transfer: Supervision/safety;Regular Toilet;Grab bars   Toileting- Clothing Manipulation and Hygiene: Modified independent;Sit to/from stand       Functional mobility during ADLs: Supervision/safety General ADL Comments: Patient limited by anxiety and SOB during session.  Supplemental oxgyen via [place] at 3L with saturations maintained above 92%.  Reviewed energy conservation techniques and provided handout     Vision Baseline Vision/History: Wears glasses Wears Glasses: Reading only Vision Assessment?: No apparent visual deficits     Perception     Praxis      Pertinent Vitals/Pain Pain Assessment: No/denies pain     Hand Dominance     Extremity/Trunk Assessment Upper Extremity Assessment Upper Extremity Assessment: Overall WFL for tasks assessed   Lower Extremity Assessment Lower Extremity Assessment: Defer to PT evaluation   Cervical / Trunk Assessment Cervical / Trunk  Assessment: Other exceptions Cervical / Trunk Exceptions: rounded shoulders and forward head.    Communication  Communication Communication: No difficulties   Cognition Arousal/Alertness: Awake/alert Behavior During Therapy: WFL for tasks assessed/performed Overall Cognitive Status: Within Functional Limits for tasks assessed                                     General Comments  becoming anxious during session, requesting medication from RN; reviewed energy conservation techniques    Exercises     Shoulder Instructions      Home Living Family/patient expects to be discharged to:: Unsure(possible motel per pt) Living Arrangements: Alone                               Additional Comments: patient reports living in [place] - has been "bouncing around" to different housing venues      Prior Functioning/Environment Level of Independence: Independent        Comments: 3L home O2. Limited mobility and IADls secondary to SOB. Reports increased time for ADLs but independent.        OT Problem List: Decreased strength;Decreased activity tolerance;Cardiopulmonary status limiting activity;Decreased safety awareness      OT Treatment/Interventions: Self-care/ADL training;Energy conservation;DME and/or AE instruction;Patient/family education;Balance training;Therapeutic activities    OT Goals(Current goals can be found in the care plan section) Acute Rehab OT Goals Patient Stated Goal: to feel better OT Goal Formulation: With patient Time For Goal Achievement: 10/09/17 Potential to Achieve Goals: Good  OT Frequency: Min 2X/week   Barriers to D/C:            Co-evaluation              AM-PAC PT "6 Clicks" Daily Activity     Outcome Measure Help from another person eating meals?: None Help from another person taking care of personal grooming?: None Help from another person toileting, which includes using toliet, bedpan, or urinal?: None Help from another person bathing (including washing, rinsing, drying)?: A Little Help from another person to put on and  taking off regular upper body clothing?: None Help from another person to put on and taking off regular lower body clothing?: A Little 6 Click Score: 22   End of Session Equipment Utilized During Treatment: Oxygen Nurse Communication: Mobility status  Activity Tolerance: Other (comment)(limited by anxiety ) Patient left: with call bell/phone within reach;with nursing/sitter in room;Other (comment)(seated EOB)  OT Visit Diagnosis: Unsteadiness on feet (R26.81);Muscle weakness (generalized) (M62.81);Other (comment)(activity tolerance)                Time: 5409-8119 OT Time Calculation (min): 18 min Charges:  OT General Charges $OT Visit: 1 Visit OT Evaluation $OT Eval Moderate Complexity: [place], OT Acute Rehabilitation Services Pager [phone] Office [phone]  Delight Stare 09/25/2017, 2:30 PM

## 2017-09-25 NOTE — Progress Notes (Signed)
NCM called and arranged transportation to home provided by [facility]. NCM made pt and nurse aware. Whitman Hero RN,BSN,CM

## 2017-09-25 NOTE — Progress Notes (Signed)
Taylor [name] to be D/C'd Home per MD order.  Discussed prescriptions and follow up appointments with the patient. Prescriptions given to patient, medication list explained in detail. Pt verbalized understanding.  Allergies as of 09/25/2017      Reactions   Codeine Nausea And Vomiting      Medication List    TAKE these medications   albuterol 108 (90 Base) MCG/ACT inhaler Commonly known as:  PROVENTIL HFA;VENTOLIN HFA Inhale 2 puffs into the lungs every 6 (six) hours as needed for wheezing or shortness of breath. What changed:  when to take this   albuterol (5 MG/ML) 0.5% nebulizer solution Commonly known as:  PROVENTIL Take 1 mL (5 mg total) by nebulization every 6 (six) hours as needed for wheezing or shortness of breath. What changed:  Another medication with the same name was changed. Make sure you understand how and when to take each.   ALPRAZolam 0.5 MG tablet Commonly known as:  XANAX Take 1 tablet (0.5 mg total) by mouth 3 (three) times daily as needed for anxiety.   ARIPiprazole 5 MG tablet Commonly known as:  ABILIFY Take 1 tablet (5 mg total) by mouth daily with breakfast.   BC HEADACHE POWDER PO Take 1 packet by mouth as needed (for headaches).   blood glucose meter kit and supplies Kit Dispense based on patient and insurance preference. Use up to four times daily as directed. (FOR ICD-9 250.00, 250.01).   doxycycline 100 MG tablet Commonly known as:  VIBRA-TABS Take 1 tablet (100 mg total) by mouth every 12 (twelve) hours.   escitalopram 10 MG tablet Commonly known as:  LEXAPRO Take 1 tablet (10 mg total) by mouth daily.   folic acid 1 MG tablet Commonly known as:  FOLVITE Take 1 tablet (1 mg total) by mouth daily. Start taking on:  09/26/2017   guaiFENesin 600 MG 12 hr tablet Commonly known as:  MUCINEX Take 1 tablet (600 mg total) by mouth 2 (two) times daily.   lovastatin 20 MG tablet Commonly known as:  MEVACOR Take 1 tablet (20 mg total) by  mouth at bedtime.   metFORMIN 500 MG tablet Commonly known as:  GLUCOPHAGE Take 1 tablet (500 mg total) by mouth 2 (two) times daily with a meal.   mometasone-formoterol 100-5 MCG/ACT Aero Commonly known as:  DULERA Inhale 2 puffs into the lungs 2 (two) times daily.   nicotine 21 mg/24hr patch Commonly known as:  NICODERM CQ - dosed in mg/24 hours Place 1 patch (21 mg total) onto the skin daily. Start taking on:  09/26/2017   OXYGEN Inhale 3 L into the lungs continuous.   predniSONE 20 MG tablet Commonly known as:  DELTASONE Take 20 mg by mouth 2 (two) times daily.   tamsulosin 0.4 MG Caps capsule Commonly known as:  FLOMAX Take 0.4 mg by mouth daily.   thiamine 100 MG tablet Take 1 tablet (100 mg total) by mouth daily. Start taking on:  09/26/2017   tiotropium 18 MCG inhalation capsule Commonly known as:  SPIRIVA Place 1 capsule (18 mcg total) into inhaler and inhale daily.       Vitals:   09/25/17 0842 09/25/17 1147  BP:    Pulse: 76 85  Resp: 17   Temp:    SpO2: 97%     Skin clean, dry and intact without evidence of skin break down, no evidence of skin tears noted. IV catheter discontinued intact. Site without signs and symptoms of complications. Dressing and  pressure applied. Pt denies pain at this time. No complaints noted.  An After Visit Summary was printed and given to the patient. Patient waiting for transportation via [place] transport.  Chapman Fitch BSN, RN [employer] [address] Phone [phone]

## 2017-09-26 ENCOUNTER — Emergency Department (HOSPITAL_COMMUNITY)
Admission: EM | Admit: 2017-09-26 | Discharge: 2017-09-27 | Disposition: A | Discharge status: Home / Self Care | Payer: Medicaid Other | Source: Home / Self Care | Attending: Emergency Medicine | Admitting: Emergency Medicine

## 2017-09-26 ENCOUNTER — Emergency Department (HOSPITAL_COMMUNITY): Payer: Medicaid Other

## 2017-09-26 DIAGNOSIS — J42 Unspecified chronic bronchitis: Secondary | ICD-10-CM | POA: Insufficient documentation

## 2017-09-26 DIAGNOSIS — Z87891 Personal history of nicotine dependence: Secondary | ICD-10-CM

## 2017-09-26 DIAGNOSIS — Z79899 Other long term (current) drug therapy: Secondary | ICD-10-CM | POA: Insufficient documentation

## 2017-09-26 DIAGNOSIS — Z8546 Personal history of malignant neoplasm of prostate: Secondary | ICD-10-CM

## 2017-09-26 DIAGNOSIS — Z9981 Dependence on supplemental oxygen: Secondary | ICD-10-CM | POA: Insufficient documentation

## 2017-09-26 DIAGNOSIS — F410 Panic disorder [episodic paroxysmal anxiety] without agoraphobia: Secondary | ICD-10-CM

## 2017-09-26 DIAGNOSIS — E099 Drug or chemical induced diabetes mellitus without complications: Secondary | ICD-10-CM

## 2017-09-26 LAB — I-STAT ARTERIAL BLOOD GAS, ED
ACID-BASE EXCESS: 12 mmol/L — AB (ref 0.0–2.0)
Bicarbonate: 39.4 mmol/L — ABNORMAL HIGH (ref 20.0–28.0)
O2 SAT: 93 %
PH ART: 7.393 (ref 7.350–7.450)
TCO2: 41 mmol/L — ABNORMAL HIGH (ref 22–32)
pCO2 arterial: 64.6 mmHg — ABNORMAL HIGH (ref 32.0–48.0)
pO2, Arterial: 71 mmHg — ABNORMAL LOW (ref 83.0–108.0)

## 2017-09-26 LAB — COMPREHENSIVE METABOLIC PANEL
ALK PHOS: 46 U/L (ref 38–126)
ALT: 36 U/L (ref 0–44)
ANION GAP: 8 (ref 5–15)
AST: 18 U/L (ref 15–41)
Albumin: 3.4 g/dL — ABNORMAL LOW (ref 3.5–5.0)
BILIRUBIN TOTAL: 0.6 mg/dL (ref 0.3–1.2)
BUN: 9 mg/dL (ref 8–23)
CALCIUM: 8.5 mg/dL — AB (ref 8.9–10.3)
CO2: 38 mmol/L — ABNORMAL HIGH (ref 22–32)
CREATININE: 0.79 mg/dL (ref 0.61–1.24)
Chloride: 97 mmol/L — ABNORMAL LOW (ref 98–111)
Glucose, Bld: 246 mg/dL — ABNORMAL HIGH (ref 70–99)
Potassium: 4.6 mmol/L (ref 3.5–5.1)
Sodium: 143 mmol/L (ref 135–145)
TOTAL PROTEIN: 5.3 g/dL — AB (ref 6.5–8.1)

## 2017-09-26 LAB — CBC WITH DIFFERENTIAL/PLATELET
ABS IMMATURE GRANULOCYTES: 0.3 10*3/uL — AB (ref 0.0–0.1)
BASOS ABS: 0 10*3/uL (ref 0.0–0.1)
BASOS PCT: 1 %
EOS PCT: 0 %
Eosinophils Absolute: 0 10*3/uL (ref 0.0–0.7)
HCT: 42 % (ref 39.0–52.0)
HEMOGLOBIN: 13.2 g/dL (ref 13.0–17.0)
Immature Granulocytes: 4 %
LYMPHS PCT: 10 %
Lymphs Abs: 0.8 10*3/uL (ref 0.7–4.0)
MCH: 30.3 pg (ref 26.0–34.0)
MCHC: 31.4 g/dL (ref 30.0–36.0)
MCV: 96.6 fL (ref 78.0–100.0)
MONOS PCT: 5 %
Monocytes Absolute: 0.4 10*3/uL (ref 0.1–1.0)
NEUTROS ABS: 6.2 10*3/uL (ref 1.7–7.7)
Neutrophils Relative %: 80 %
Platelets: 156 10*3/uL (ref 150–400)
RBC: 4.35 MIL/uL (ref 4.22–5.81)
RDW: 12.5 % (ref 11.5–15.5)
WBC: 7.7 10*3/uL (ref 4.0–10.5)

## 2017-09-26 LAB — I-STAT TROPONIN, ED
TROPONIN I, POC: 0.01 ng/mL (ref 0.00–0.08)
Troponin i, poc: 0.02 ng/mL (ref 0.00–0.08)

## 2017-09-26 MED ORDER — ALBUTEROL SULFATE (2.5 MG/3ML) 0.083% IN NEBU
5.0000 mg | INHALATION_SOLUTION | Freq: Once | RESPIRATORY_TRACT | Status: AC
  Administered 2017-09-26: 5 mg via RESPIRATORY_TRACT
  Filled 2017-09-26: qty 6

## 2017-09-26 MED ORDER — ALPRAZOLAM 0.5 MG PO TABS: 0.5000 mg | ORAL_TABLET | Freq: Three times a day (TID) | ORAL | 0 refills | Status: DC | PRN

## 2017-09-26 MED ORDER — MAGNESIUM SULFATE 2 GM/50ML IV SOLN
2.0000 g | Freq: Once | INTRAVENOUS | Status: AC
  Administered 2017-09-26: 2 g via INTRAVENOUS
  Filled 2017-09-26: qty 50

## 2017-09-26 MED ORDER — IPRATROPIUM BROMIDE 0.02 % IN SOLN
0.5000 mg | Freq: Once | RESPIRATORY_TRACT | Status: AC
  Administered 2017-09-26: 0.5 mg via RESPIRATORY_TRACT
  Filled 2017-09-26: qty 2.5

## 2017-09-26 MED ORDER — LORAZEPAM 2 MG/ML IJ SOLN
1.0000 mg | Freq: Once | INTRAMUSCULAR | Status: AC
  Administered 2017-09-26: 1 mg via INTRAVENOUS
  Filled 2017-09-26: qty 1

## 2017-09-26 NOTE — Discharge Instructions (Signed)
Continue your current meds for COPD. Use albuterol as needed for shortness of breath. Continue with your oxygen   Take xanax as needed for anxiety.   You were given a list of shelters.   See your doctor  Return to ER if you have worse shortness of breath, cough, chest pain, trouble breathing, fever

## 2017-09-26 NOTE — ED Triage Notes (Signed)
Per [facility], pt walking around and experienced shortness of breath and 5/10 chest tightness. Pt hx of COPD and always on 3L [place]. Pt endorses he ran out of home O2. Pt received 324 ASA, 0.4 NTG, 1 duoneb, and 125 solumedrol and now rates pain 3/10 and dull. EMS endorses wheezing in all fields. Pt alert and oriented.

## 2017-09-26 NOTE — ED Notes (Signed)
Social work at bedside.  

## 2017-09-26 NOTE — ED Notes (Signed)
Social work to [employer] for transport back to [facility]

## 2017-09-26 NOTE — ED Notes (Signed)
ED Provider at bedside. 

## 2017-09-26 NOTE — Progress Notes (Addendum)
CSW called [facility]. [facility] needed due to pt being on 3 L of continuous oxygen and not having transportable oxygen with him.   CSW spoke with pt. Pt formerly lived at [facility]. The group home was shut down. Pt is now staying at the [place] [facility] [place] from [place]. Pt stated that Bella Kennedy was assisting with finding new placement. Bella Kennedy unaware of pt's situation and does not have him on her caseload. Bella Kennedy is reaching out to pt's former APS worker Ernest Pine to see what is going on for the pt.   CSW updated pt. Pt aware that he will be transitioned home to the [facility] and APS/DSS may follow up with him there. APS/DSS will follow up with CSW on Monday about pt's search for new group home. CSW updated pt.   Wendelyn Breslow, [facility]  [phone]

## 2017-09-26 NOTE — ED Notes (Signed)
[facility] confirmed pt is still on list for transport. [facility] aware pt is staying at [facility] and needs transport due to oxygen dependency.
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
## 2017-09-26 NOTE — ED Notes (Addendum)
RN came in room to set up transport back to [facility]. Pt now reporting suicidal thoughts. Pt denies any specific plans. EDP notified.

## 2017-09-26 NOTE — Progress Notes (Addendum)
12:53pm- CSW spoke with pt once more and was informed that pt has a friend that he can stay with however pt's friend Lanny Hurst is out of town for the weekend.   CSW will provide pt with shelter resources at this time. No further CSW needs warranted at this time.   CSW consulted as pt has no place to go. CSW was advised that pt has been staying at the [facility] and now funds have ran out for pt. CSW aware that per last note when pt was seen by unit CSW -housing resources have been given to pt. CSW to follow up RNCM to see what other resources are available to pt.    Taylor [name], MSW, [place] [facility] Clinical Social Worker [phone]

## 2017-09-26 NOTE — Discharge Planning (Signed)
[facility] consulted.  Pt has medicaid with pharmacy coverage.  No EDCM needs identified.

## 2017-09-28 ENCOUNTER — Inpatient Hospital Stay (HOSPITAL_COMMUNITY)
Admission: EM | Admit: 2017-09-28 | Discharge: 2017-10-03 | DRG: 190 | Disposition: A | Discharge status: Home Health | Payer: Medicaid Other | Attending: Internal Medicine | Admitting: Internal Medicine

## 2017-09-28 ENCOUNTER — Other Ambulatory Visit: Payer: Self-pay

## 2017-09-28 ENCOUNTER — Encounter (HOSPITAL_COMMUNITY): Payer: Self-pay | Admitting: Emergency Medicine

## 2017-09-28 ENCOUNTER — Emergency Department (HOSPITAL_COMMUNITY): Payer: Medicaid Other

## 2017-09-28 DIAGNOSIS — Z7952 Long term (current) use of systemic steroids: Secondary | ICD-10-CM

## 2017-09-28 DIAGNOSIS — F17210 Nicotine dependence, cigarettes, uncomplicated: Secondary | ICD-10-CM | POA: Diagnosis present

## 2017-09-28 DIAGNOSIS — Z66 Do not resuscitate: Secondary | ICD-10-CM | POA: Diagnosis present

## 2017-09-28 DIAGNOSIS — Z885 Allergy status to narcotic agent status: Secondary | ICD-10-CM | POA: Diagnosis not present

## 2017-09-28 DIAGNOSIS — Z7984 Long term (current) use of oral hypoglycemic drugs: Secondary | ICD-10-CM | POA: Diagnosis not present

## 2017-09-28 DIAGNOSIS — Z85038 Personal history of other malignant neoplasm of large intestine: Secondary | ICD-10-CM

## 2017-09-28 DIAGNOSIS — R627 Adult failure to thrive: Secondary | ICD-10-CM | POA: Diagnosis present

## 2017-09-28 DIAGNOSIS — R45851 Suicidal ideations: Secondary | ICD-10-CM | POA: Diagnosis present

## 2017-09-28 DIAGNOSIS — Z9981 Dependence on supplemental oxygen: Secondary | ICD-10-CM

## 2017-09-28 DIAGNOSIS — N400 Enlarged prostate without lower urinary tract symptoms: Secondary | ICD-10-CM | POA: Diagnosis present

## 2017-09-28 DIAGNOSIS — Z8546 Personal history of malignant neoplasm of prostate: Secondary | ICD-10-CM

## 2017-09-28 DIAGNOSIS — F410 Panic disorder [episodic paroxysmal anxiety] without agoraphobia: Secondary | ICD-10-CM | POA: Diagnosis present

## 2017-09-28 DIAGNOSIS — F419 Anxiety disorder, unspecified: Secondary | ICD-10-CM

## 2017-09-28 DIAGNOSIS — J9611 Chronic respiratory failure with hypoxia: Secondary | ICD-10-CM | POA: Diagnosis present

## 2017-09-28 DIAGNOSIS — R1013 Epigastric pain: Secondary | ICD-10-CM | POA: Diagnosis present

## 2017-09-28 DIAGNOSIS — Z8249 Family history of ischemic heart disease and other diseases of the circulatory system: Secondary | ICD-10-CM | POA: Diagnosis not present

## 2017-09-28 DIAGNOSIS — J9612 Chronic respiratory failure with hypercapnia: Secondary | ICD-10-CM | POA: Diagnosis not present

## 2017-09-28 DIAGNOSIS — Z79899 Other long term (current) drug therapy: Secondary | ICD-10-CM | POA: Diagnosis not present

## 2017-09-28 DIAGNOSIS — F319 Bipolar disorder, unspecified: Secondary | ICD-10-CM | POA: Diagnosis present

## 2017-09-28 DIAGNOSIS — J9622 Acute and chronic respiratory failure with hypercapnia: Secondary | ICD-10-CM | POA: Diagnosis present

## 2017-09-28 DIAGNOSIS — Z818 Family history of other mental and behavioral disorders: Secondary | ICD-10-CM | POA: Diagnosis not present

## 2017-09-28 DIAGNOSIS — E785 Hyperlipidemia, unspecified: Secondary | ICD-10-CM | POA: Diagnosis present

## 2017-09-28 DIAGNOSIS — Z6821 Body mass index (BMI) 21.0-21.9, adult: Secondary | ICD-10-CM | POA: Diagnosis not present

## 2017-09-28 DIAGNOSIS — J441 Chronic obstructive pulmonary disease with (acute) exacerbation: Principal | ICD-10-CM | POA: Diagnosis present

## 2017-09-28 DIAGNOSIS — Z23 Encounter for immunization: Secondary | ICD-10-CM | POA: Diagnosis not present

## 2017-09-28 DIAGNOSIS — Z801 Family history of malignant neoplasm of trachea, bronchus and lung: Secondary | ICD-10-CM | POA: Diagnosis not present

## 2017-09-28 DIAGNOSIS — J9621 Acute and chronic respiratory failure with hypoxia: Secondary | ICD-10-CM | POA: Diagnosis present

## 2017-09-28 DIAGNOSIS — F17200 Nicotine dependence, unspecified, uncomplicated: Secondary | ICD-10-CM | POA: Diagnosis present

## 2017-09-28 DIAGNOSIS — Z590 Homelessness: Secondary | ICD-10-CM | POA: Diagnosis not present

## 2017-09-28 DIAGNOSIS — F4322 Adjustment disorder with anxiety: Secondary | ICD-10-CM | POA: Diagnosis present

## 2017-09-28 DIAGNOSIS — R079 Chest pain, unspecified: Secondary | ICD-10-CM | POA: Diagnosis not present

## 2017-09-28 DIAGNOSIS — J9602 Acute respiratory failure with hypercapnia: Secondary | ICD-10-CM | POA: Diagnosis present

## 2017-09-28 DIAGNOSIS — J449 Chronic obstructive pulmonary disease, unspecified: Secondary | ICD-10-CM | POA: Diagnosis present

## 2017-09-28 LAB — CBC WITH DIFFERENTIAL/PLATELET
Abs Immature Granulocytes: 0.3 10*3/uL — ABNORMAL HIGH (ref 0.0–0.1)
Basophils Absolute: 0 10*3/uL (ref 0.0–0.1)
Basophils Relative: 1 %
EOS ABS: 0.1 10*3/uL (ref 0.0–0.7)
EOS PCT: 1 %
HEMATOCRIT: 42.7 % (ref 39.0–52.0)
Hemoglobin: 13.3 g/dL (ref 13.0–17.0)
Immature Granulocytes: 3 %
LYMPHS ABS: 1.2 10*3/uL (ref 0.7–4.0)
LYMPHS PCT: 16 %
MCH: 30.8 pg (ref 26.0–34.0)
MCHC: 31.1 g/dL (ref 30.0–36.0)
MCV: 98.8 fL (ref 78.0–100.0)
MONOS PCT: 7 %
Monocytes Absolute: 0.5 10*3/uL (ref 0.1–1.0)
Neutro Abs: 5.5 10*3/uL (ref 1.7–7.7)
Neutrophils Relative %: 72 %
Platelets: 162 10*3/uL (ref 150–400)
RBC: 4.32 MIL/uL (ref 4.22–5.81)
RDW: 12.6 % (ref 11.5–15.5)
WBC: 7.6 10*3/uL (ref 4.0–10.5)

## 2017-09-28 LAB — HEPATIC FUNCTION PANEL
ALT: 36 U/L (ref 0–44)
AST: 21 U/L (ref 15–41)
Albumin: 3.5 g/dL (ref 3.5–5.0)
Alkaline Phosphatase: 44 U/L (ref 38–126)
BILIRUBIN INDIRECT: 0.4 mg/dL (ref 0.3–0.9)
BILIRUBIN TOTAL: 0.5 mg/dL (ref 0.3–1.2)
Bilirubin, Direct: 0.1 mg/dL (ref 0.0–0.2)
Total Protein: 5.3 g/dL — ABNORMAL LOW (ref 6.5–8.1)

## 2017-09-28 LAB — ETHANOL: Alcohol, Ethyl (B): <10 mg/dL (ref <10)

## 2017-09-28 LAB — RAPID URINE DRUG SCREEN, HOSP PERFORMED
Amphetamines: NOT DETECTED
BENZODIAZEPINES: NOT DETECTED
Barbiturates: NOT DETECTED
Cocaine: NOT DETECTED
Opiates: NOT DETECTED
Tetrahydrocannabinol: NOT DETECTED

## 2017-09-28 LAB — BASIC METABOLIC PANEL
Anion gap: 8 (ref 5–15)
BUN: 21 mg/dL (ref 8–23)
CHLORIDE: 102 mmol/L (ref 98–111)
CO2: 34 mmol/L — ABNORMAL HIGH (ref 22–32)
CREATININE: 0.71 mg/dL (ref 0.61–1.24)
Calcium: 8 mg/dL — ABNORMAL LOW (ref 8.9–10.3)
GFR calc Af Amer: >60 mL/min (ref >60)
GFR calc non Af Amer: >60 mL/min (ref >60)
GLUCOSE: 164 mg/dL — AB (ref 70–99)
POTASSIUM: 3.8 mmol/L (ref 3.5–5.1)
Sodium: 144 mmol/L (ref 135–145)

## 2017-09-28 LAB — GLUCOSE, CAPILLARY
Glucose-Capillary: 290 mg/dL — ABNORMAL HIGH (ref 70–99)
Glucose-Capillary: 306 mg/dL — ABNORMAL HIGH (ref 70–99)

## 2017-09-28 LAB — HEMOGLOBIN A1C
Hgb A1c MFr Bld: 8.2 % — ABNORMAL HIGH (ref 4.8–5.6)
Mean Plasma Glucose: 188.64 mg/dL

## 2017-09-28 LAB — I-STAT TROPONIN, ED: Troponin i, poc: 0.02 ng/mL (ref 0.00–0.08)

## 2017-09-28 MED ORDER — PREDNISONE 20 MG PO TABS
40.0000 mg | ORAL_TABLET | Freq: Every day | ORAL | Status: AC
  Administered 2017-09-29 – 2017-10-02 (×4): 40 mg via ORAL
  Filled 2017-09-28 (×4): qty 2

## 2017-09-28 MED ORDER — NICOTINE 21 MG/24HR TD PT24
21.0000 mg | MEDICATED_PATCH | Freq: Every day | TRANSDERMAL | Status: DC
  Administered 2017-09-28 – 2017-10-03 (×6): 21 mg via TRANSDERMAL
  Filled 2017-09-28 (×6): qty 1

## 2017-09-28 MED ORDER — INFLUENZA VAC SPLIT QUAD 0.5 ML IM SUSY
0.5000 mL | PREFILLED_SYRINGE | INTRAMUSCULAR | Status: AC
  Administered 2017-09-29: 0.5 mL via INTRAMUSCULAR
  Filled 2017-09-28: qty 0.5

## 2017-09-28 MED ORDER — TIOTROPIUM BROMIDE MONOHYDRATE 18 MCG IN CAPS
18.0000 ug | ORAL_CAPSULE | Freq: Every day | RESPIRATORY_TRACT | Status: DC
  Administered 2017-09-29 – 2017-10-03 (×5): 18 ug via RESPIRATORY_TRACT
  Filled 2017-09-28: qty 5

## 2017-09-28 MED ORDER — ARIPIPRAZOLE 5 MG PO TABS
5.0000 mg | ORAL_TABLET | Freq: Every day | ORAL | Status: DC
  Administered 2017-09-29 – 2017-10-03 (×5): 5 mg via ORAL
  Filled 2017-09-28 (×6): qty 1

## 2017-09-28 MED ORDER — ALPRAZOLAM 0.5 MG PO TABS
0.5000 mg | ORAL_TABLET | Freq: Three times a day (TID) | ORAL | Status: DC | PRN
  Administered 2017-09-28 – 2017-09-30 (×6): 0.5 mg via ORAL
  Filled 2017-09-28 (×6): qty 1

## 2017-09-28 MED ORDER — IPRATROPIUM-ALBUTEROL 0.5-2.5 (3) MG/3ML IN SOLN
3.0000 mL | Freq: Once | RESPIRATORY_TRACT | Status: AC
  Administered 2017-09-28: 3 mL via RESPIRATORY_TRACT
  Filled 2017-09-28: qty 3

## 2017-09-28 MED ORDER — GUAIFENESIN ER 600 MG PO TB12
600.0000 mg | ORAL_TABLET | Freq: Two times a day (BID) | ORAL | Status: DC
  Administered 2017-09-28 – 2017-10-03 (×10): 600 mg via ORAL
  Filled 2017-09-28 (×10): qty 1

## 2017-09-28 MED ORDER — PRAVASTATIN SODIUM 20 MG PO TABS
20.0000 mg | ORAL_TABLET | Freq: Every day | ORAL | Status: DC
  Administered 2017-09-28 – 2017-10-02 (×5): 20 mg via ORAL
  Filled 2017-09-28 (×5): qty 1

## 2017-09-28 MED ORDER — NICOTINE 21 MG/24HR TD PT24: 21.0000 mg | MEDICATED_PATCH | Freq: Every day | TRANSDERMAL | Status: DC

## 2017-09-28 MED ORDER — PREDNISONE 20 MG PO TABS
60.0000 mg | ORAL_TABLET | Freq: Once | ORAL | Status: AC
  Administered 2017-09-28: 60 mg via ORAL
  Filled 2017-09-28: qty 3

## 2017-09-28 MED ORDER — METHYLPREDNISOLONE SODIUM SUCC 125 MG IJ SOLR
80.0000 mg | Freq: Two times a day (BID) | INTRAMUSCULAR | Status: AC
  Administered 2017-09-28 – 2017-09-29 (×2): 80 mg via INTRAVENOUS
  Filled 2017-09-28 (×2): qty 2

## 2017-09-28 MED ORDER — LEVOFLOXACIN IN D5W 750 MG/150ML IV SOLN
750.0000 mg | INTRAVENOUS | Status: DC
  Administered 2017-09-28 – 2017-09-30 (×3): 750 mg via INTRAVENOUS
  Filled 2017-09-28 (×3): qty 150

## 2017-09-28 MED ORDER — VITAMIN B-1 100 MG PO TABS
100.0000 mg | ORAL_TABLET | Freq: Every day | ORAL | Status: DC
  Administered 2017-09-28 – 2017-10-03 (×6): 100 mg via ORAL
  Filled 2017-09-28 (×6): qty 1

## 2017-09-28 MED ORDER — IPRATROPIUM-ALBUTEROL 0.5-2.5 (3) MG/3ML IN SOLN
3.0000 mL | Freq: Once | RESPIRATORY_TRACT | Status: DC
  Filled 2017-09-28: qty 3

## 2017-09-28 MED ORDER — MAGNESIUM SULFATE 2 GM/50ML IV SOLN
2.0000 g | Freq: Once | INTRAVENOUS | Status: AC
  Administered 2017-09-28: 2 g via INTRAVENOUS
  Filled 2017-09-28: qty 50

## 2017-09-28 MED ORDER — ESCITALOPRAM OXALATE 10 MG PO TABS
10.0000 mg | ORAL_TABLET | Freq: Every day | ORAL | Status: DC
  Administered 2017-09-28 – 2017-10-01 (×4): 10 mg via ORAL
  Filled 2017-09-28 (×4): qty 1

## 2017-09-28 MED ORDER — ENOXAPARIN SODIUM 40 MG/0.4ML ~~LOC~~ SOLN
40.0000 mg | SUBCUTANEOUS | Status: DC
  Administered 2017-09-28 – 2017-10-01 (×4): 40 mg via SUBCUTANEOUS
  Filled 2017-09-28 (×6): qty 0.4

## 2017-09-28 MED ORDER — LORAZEPAM 2 MG/ML IJ SOLN
1.0000 mg | Freq: Once | INTRAMUSCULAR | Status: AC
  Administered 2017-09-28: 1 mg via INTRAVENOUS
  Filled 2017-09-28: qty 1

## 2017-09-28 MED ORDER — INSULIN ASPART 100 UNIT/ML ~~LOC~~ SOLN
0.0000 [IU] | Freq: Three times a day (TID) | SUBCUTANEOUS | Status: DC
  Administered 2017-09-28: 5 [IU] via SUBCUTANEOUS
  Administered 2017-09-29: 2 [IU] via SUBCUTANEOUS
  Administered 2017-09-29: 5 [IU] via SUBCUTANEOUS
  Administered 2017-09-29: 3 [IU] via SUBCUTANEOUS
  Administered 2017-09-30: 7 [IU] via SUBCUTANEOUS
  Administered 2017-09-30: 3 [IU] via SUBCUTANEOUS
  Administered 2017-10-01: 2 [IU] via SUBCUTANEOUS
  Administered 2017-10-01: 5 [IU] via SUBCUTANEOUS
  Administered 2017-10-02 (×2): 3 [IU] via SUBCUTANEOUS
  Administered 2017-10-03: 1 [IU] via SUBCUTANEOUS

## 2017-09-28 MED ORDER — TAMSULOSIN HCL 0.4 MG PO CAPS
0.4000 mg | ORAL_CAPSULE | Freq: Every day | ORAL | Status: DC
  Administered 2017-09-28 – 2017-10-03 (×6): 0.4 mg via ORAL
  Filled 2017-09-28 (×6): qty 1

## 2017-09-28 MED ORDER — FOLIC ACID 1 MG PO TABS
1.0000 mg | ORAL_TABLET | Freq: Every day | ORAL | Status: DC
  Administered 2017-09-28 – 2017-10-03 (×6): 1 mg via ORAL
  Filled 2017-09-28 (×6): qty 1

## 2017-09-28 NOTE — ED Notes (Signed)
Monitored pulse ox while pt ambulated with 3L O2 via nasal canula. Pt ambulated 25 ft without assistive device before asking to rest and return; sat in chair in hallway for 30 sec prior to returning.  Prior to ambulating: HR 80 bpm, O2 94% During ambulation: HR 121bpm, O2 91% After ambulation (5 min): HR 70bpm, 93%

## 2017-09-28 NOTE — ED Triage Notes (Addendum)
Pt arrives via [facility], per ems pt is end stage COPD, states that he began having worsening sob this am which causes him a lot of anxiety, pt very tremulous upon arrival. Pt denies cp. Wears 3L O2 at baseline, currently 100%. Pt states he lives in a hotel and cannot even walk to the front desk without becoming so sob he feels like he is going to faint. Has social worker coming to see him tmrw to discuss placement. A/ox4.

## 2017-09-28 NOTE — ED Notes (Signed)
ED Provider at bedside. 

## 2017-09-28 NOTE — H&P (Signed)
History and Physical  Taylor [name] LKG:[id number] DOB: 02/23/1955 DOA: 09/28/2017  Referring physician: Recardo Evangelist, PA-C PCP: [facility]  Outpatient Specialists:  Patient coming from: motel & is able to ambulate  Chief Complaint:   HPI: Taylor [name] is a 62 y.o. male with medical history significant for COPD still smoking history of pneumonia history of colon cancer bipolar disorder.  Will present with complaint of shortness of breath and anxiety he stated he just could not function anymore he became really short of breath with any little activity and could not complete his ADL he denies fever cough wheezing chest pain he admits to lightheadedness when he moves around because of his shortness of breath he is also feeling extremely anxious.  He was seen in the emergency room September 6 for the same problem he was discharged on Xanax he stated he has not been taking it he is on 3 L of oxygen at home baseline he called EMS and EMS gave him breathing treatment prior to arrival to the emergency department.  He is also on chronic steroid but he had not taken anything today.  He had a recent CT a month ago which was negative for pulmonary embolism.Marland Kitchen  He also stated that he is a prior for sex offender as a result he is living in a motel.  When he came in he was severely short of breath and hypoxic   ED Course: When he came into the emergency room he was severely short of breath and hypoxic he was given Proventil and Solu-Medrol magnesium sulfate intravenously and lorazepam IV.  He felt better than he was walked with ambulatory pulse ox and he was noted to be saturating his heart rate was going up  Review of Systems: . Pt complains of shortness of breath  Pt denies any chest pain fever diarrhea.  Review of systems are otherwise negative   Past Medical History:  Diagnosis Date  . Asthma   . Bipolar 1 disorder ([place]) 02/05/2012  . Colon cancer ([place]) 04/11/11   adenocarcinoma of colon, 7/19 nodes pos.FINISHED CHEMO/DR. SHERRILL  . COPD (chronic obstructive pulmonary disease) ([place])    SMOKER  . Depression   . Emphysema of lung ([place])   . Full dentures   . Hemorrhoids   . On home oxygen therapy    "3L; 24/7" (05/29/2017)  . Pneumonia ~ 2016   "double pneumonia"  . Prostate cancer ([place])    Gleason score = 7, supposed to have radiation therapy but he has not followed up (05/29/2017)  . Rib fractures    hx of   Past Surgical History:  Procedure Laterality Date  . COLON SURGERY  04/11/11   Sigmoid colectomy  . COLOSTOMY REVISION  04/11/2011   Procedure: COLON RESECTION SIGMOID;  Surgeon: [name], MD;  Location: [facility];  Service: General;  Laterality: N/A;  low anterior colon resection   . HERNIA REPAIR    . INGUINAL HERNIA REPAIR Right 05/01/2012   Procedure: HERNIA REPAIR INGUINAL ADULT;  Surgeon: [name], MD;  Location: [facility];  Service: General;  Laterality: Right;  . INSERTION OF MESH Right 05/01/2012   Procedure: INSERTION OF MESH;  Surgeon: [name], MD;  [facility]: [facility];  Service: General;  Laterality: Right;  . PORT-A-CATH REMOVAL Left 05/01/2012   Procedure: REMOVAL Infusion Port;  Surgeon: [name], MD;  [facility]: [facility];  Service: General;  Laterality: Left;  . PORTACATH PLACEMENT  05/02/2011   Procedure: INSERTION PORT-A-CATH;  Surgeon: [name], MD;  Location: [facility];  Service: General;  Laterality: N/A;  . PROSTATE BIOPSY      Social History:  reports that he quit smoking about 4 months ago. His smoking use included cigarettes. He has a 4.60 pack-year smoking history. He has quit using smokeless tobacco.  His smokeless tobacco use included chew. He reports that he drank alcohol. He reports that he does not use drugs.   Allergies  Allergen Reactions  . Codeine Nausea And Vomiting    Family History  Problem Relation Age of Onset  . Heart disease Father   . Lung cancer Maternal Uncle        smoked       Prior to Admission medications   Medication Sig Start Date End Date Taking? Authorizing Provider  albuterol (PROVENTIL HFA;VENTOLIN HFA) 108 (90 Base) MCG/ACT inhaler Inhale 2 puffs into the lungs every 6 (six) hours as needed for wheezing or shortness of breath. Patient taking differently: Inhale 2 puffs into the lungs 4 (four) times daily.  08/26/17  Yes Isabelle Course, MD  albuterol (PROVENTIL) (5 MG/ML) 0.5% nebulizer solution Take 1 mL (5 mg total) by nebulization every 6 (six) hours as needed for wheezing or shortness of breath. 09/07/17  Yes Danford, Suann Larry, MD  ARIPiprazole (ABILIFY) 5 MG tablet Take 1 tablet (5 mg total) by mouth daily with breakfast. 08/27/17  Yes Isabelle Course, MD  escitalopram (LEXAPRO) 10 MG tablet Take 1 tablet (10 mg total) by mouth daily. 08/27/17  Yes Isabelle Course, MD  lovastatin (MEVACOR) 20 MG tablet Take 1 tablet (20 mg total) by mouth at bedtime. 05/18/17  Yes Nita Sells, MD  mometasone-formoterol (DULERA) 100-5 MCG/ACT AERO Inhale 2 puffs into the lungs 2 (two) times daily. 05/18/17  Yes Nita Sells, MD  OXYGEN Inhale 3 L into the lungs continuous.    Yes [provider]  predniSONE (DELTASONE) 20 MG tablet Take 20 mg by mouth 2 (two) times daily.    Yes [provider]  tamsulosin (FLOMAX) 0.4 MG CAPS capsule Take 0.4 mg by mouth daily.   Yes [provider]  tiotropium (SPIRIVA HANDIHALER) 18 MCG inhalation capsule Place 1 capsule (18 mcg total) into inhaler and inhale daily. 08/29/17  Yes Mesner, Corene Cornea, MD  ALPRAZolam Duanne Moron) 0.5 MG tablet Take 1 tablet (0.5 mg total) by mouth 3 (three) times daily as needed for anxiety. 09/26/17   Drenda Freeze, MD  blood glucose meter kit and supplies KIT Dispense based on patient and insurance preference. Use up to four times daily as directed. (FOR ICD-9 250.00, 250.01). 09/07/17   Danford, Suann Larry, MD  doxycycline (VIBRA-TABS) 100 MG tablet Take 1 tablet (100 mg  total) by mouth every 12 (twelve) hours. 09/25/17   Reyne Dumas, MD  folic acid (FOLVITE) 1 MG tablet Take 1 tablet (1 mg total) by mouth daily. Patient not taking: Reported on 09/28/2017 09/26/17   Reyne Dumas, MD  guaiFENesin (MUCINEX) 600 MG 12 hr tablet Take 1 tablet (600 mg total) by mouth 2 (two) times daily. Patient not taking: Reported on 09/28/2017 09/25/17   Reyne Dumas, MD  metFORMIN (GLUCOPHAGE) 500 MG tablet Take 1 tablet (500 mg total) by mouth 2 (two) times daily with a meal. Patient not taking: Reported on 09/22/2017 09/07/17   Edwin Dada, MD  nicotine (NICODERM CQ - DOSED IN MG/24 HOURS) 21 mg/24hr patch Place 1 patch (21  mg total) onto the skin daily. Patient not taking: Reported on 09/28/2017 09/26/17   Reyne Dumas, MD  thiamine 100 MG tablet Take 1 tablet (100 mg total) by mouth daily. Patient not taking: Reported on 09/28/2017 09/26/17   Reyne Dumas, MD    Physical Exam: BP 115/76   Pulse 75   Temp (!) 97.5 F (36.4 C) (Oral)   Resp (!) 22   SpO2 99%   General: Alert and oriented x3 place person and time he is well-nourished well-developed no distress Eyes: PERRLA EOMI ENT: No JVD no nasal congestion Neck: Neck is supple Cardiovascular: Tachycardia Respiratory: Effort is normal no wheezing distant breath sounds bilaterally Abdomen: Soft nontender no hepatosplenomegaly normoactive bowel sounds Skin: Warm and dry no rashes Musculoskeletal: Moves all 4 extremities no edema Psychiatric: Slightly anxious appropriate Neurologic: Not neurologic deficit obviously noted alert oriented x3          Labs on Admission:  Basic Metabolic Panel: Recent Labs  Lab 09/22/17 1530 09/23/17 0455 09/26/17 1207 09/28/17 1122  NA 142 142 143 144  K 4.9 4.3 4.6 3.8  CL 101 101 97* 102  CO2 34* 36* 38* 34*  GLUCOSE 253* 218* 246* 164*  BUN 24* '22 9 21  ' CREATININE 0.85 0.72 0.79 0.71  CALCIUM 8.4* 8.5* 8.5* 8.0*   Liver Function Tests: Recent Labs  Lab 09/23/17 0455  09/26/17 1207 09/28/17 1134  AST '17 18 21  ' ALT 37 36 36  ALKPHOS 44 46 44  BILITOT 0.5 0.6 0.5  PROT 5.1* 5.3* 5.3*  ALBUMIN 3.2* 3.4* 3.5   No results for input(s): LIPASE, AMYLASE in the last 168 hours. No results for input(s): AMMONIA in the last 168 hours. CBC: Recent Labs  Lab 09/22/17 1530 09/23/17 0455 09/26/17 1207 09/28/17 1122  WBC 8.6 8.8 7.7 7.6  NEUTROABS  --   --  6.2 5.5  HGB 12.5* 12.0* 13.2 13.3  HCT 40.1 38.5* 42.0 42.7  MCV 97.8 98.0 96.6 98.8  PLT 149* 153 156 162   Cardiac Enzymes: No results for input(s): CKTOTAL, CKMB, CKMBINDEX, TROPONINI in the last 168 hours.  BNP (last 3 results) Recent Labs    06/13/17 1003 08/24/17 1838 09/10/17 2215  BNP 33.8 34.7 94.6    ProBNP (last 3 results) No results for input(s): PROBNP in the last 8760 hours.  CBG: Recent Labs  Lab 09/24/17 1212 09/24/17 1643 09/24/17 2113 09/25/17 0824 09/25/17 1117  GLUCAP 230* 208* 126* 153* 181*    Radiological Exams on Admission: Dg Chest Port 1 View  Result Date: 09/28/2017 CLINICAL DATA:  Shortness of breath. EXAM: PORTABLE CHEST 1 VIEW COMPARISON:  September 26, 2017. FINDINGS: Hyperinflation of the lungs. Diaphragm flattening. The heart, hila, mediastinum, lungs, and pleura are otherwise unremarkable. IMPRESSION: Hyperinflation of the lungs suggesting COPD or emphysema. No other acute abnormalities. Electronically Signed   By: Dorise Bullion III M.D   On: 09/28/2017 11:13    EKG:      Sinus rhythm Short PR interval No significant change since last tracing per ER MD Assessment/Plan Present on Admission: . COPD with acute exacerbation ([place]) . Smoker . Dyspepsia . COPD  GOLD III . Acute respiratory failure with hypercapnia ([place]) . Chronic respiratory failure with hypoxia and hypercapnia (HCC) . COPD exacerbation (HCC)  COPD exacerbation he will be continued onSolu-Medrol he had just recently received doxycycline and I will change it to Levaquin we will  monitor his blood sugars Acute on chronic respiratory failure with hypercarbia O2  dependent  3 L/min Anxiety continue alprazolam 0.5 mg as well as citalopram Bipolar disorder we will continue his Abilify 5 mg  He is being admitted to telemetry inpatient    Principal Problem:   COPD with acute exacerbation ([place]) Active Problems:   COPD  GOLD III   Dyspepsia   Smoker   COPD exacerbation ([place])   Acute respiratory failure with hypercapnia (HCC)   Chronic respiratory failure with hypoxia and hypercapnia (HCC)   DVT prophylaxis: Lovenox  Code Status: DNR  Family Communication: None at bedside  Disposition Plan: Home social worker had been consulted to try and get him a place to stay  Consults called: None  Admission status: Inpatient    Cristal Deer MD [facility] Pager [phone]  If 7PM-7AM, please contact night-coverage [email or web address] Password [email or web address]  09/28/2017, 2:50 PM

## 2017-09-28 NOTE — ED Provider Notes (Signed)
[facility] EMERGENCY DEPARTMENT Provider Note   CSN: [id number] Arrival date & time: 09/28/17  1027     History   Chief Complaint Chief Complaint  Patient presents with  . Shortness of Breath  . Anxiety    HPI Taylor [name] is a 62 y.o. male who presents with SOB and anxiety. PMH significant for COPD, smoking, hx of pnumonia, hx of colon cancer, bipolar d/o. He states that he just can't function anymore. He becomes really SOB with any activity and cannot complete ADLs. He states that he is living at a motel because he is a sex offender. A SW is supposed to come see him tomorrow to help him find a place to live. He denies fever, cough, wheezing, chest pain. He does get lightheaded when he moves around because he's SOB. He also feels extremely anxious. He was seen in the ED on 9/6 for the same. He was discharged with Xanax. He says he hasn't been taking this. Pt states he's on 3L O2 at baseline. EMS gave a breathing tx prior to arrival. The patient states he is on chronic steroids but didn't take anything today. Had a recent CTA a month ago which was negative for PE.  HPI  Past Medical History:  Diagnosis Date  . Asthma   . Bipolar 1 disorder ([place]) 02/05/2012  . Colon cancer ([place]) 04/11/11   adenocarcinoma of colon, 7/19 nodes pos.FINISHED CHEMO/DR. SHERRILL  . COPD (chronic obstructive pulmonary disease) ([place])    SMOKER  . Depression   . Emphysema of lung ([place])   . Full dentures   . Hemorrhoids   . On home oxygen therapy    "3L; 24/7" (05/29/2017)  . Pneumonia ~ 2016   "double pneumonia"  . Prostate cancer ([place])    Gleason score = 7, supposed to have radiation therapy but he has not followed up (05/29/2017)  . Rib fractures    hx of    Patient Active Problem List   Diagnosis Date Noted  . Sexually offensive behavior/Sex Offender ([place] male child) 09/23/2017  . Hypoxic Respiratory failure, acute and chronic ([place]) 09/22/2017  . Palliative care by  specialist   . DNR (do not resuscitate)   . Chronic respiratory failure with hypoxia and hypercapnia ([place]) 09/14/2017  . Acute on chronic respiratory failure with hypoxia ([place]) 09/11/2017  . Anxiety and depression   . Prostate cancer ([place]) 09/04/2017  . Acute respiratory failure with hypercapnia ([place])   . SOB (shortness of breath)   . Malnutrition of moderate degree 06/14/2017  . Hyperglycemia 06/08/2017  . Constipation 06/08/2017  . SIRS (systemic inflammatory response syndrome) ([place]) 05/21/2017  . Tobacco abuse 05/13/2017  . HLD (hyperlipidemia) 05/13/2017  . Leukocytosis 04/06/2017  . Adjustment disorder with depressed mood 03/28/2017  . Adjustment disorder with mixed disturbance of emotions and conduct   . Normocytic normochromic anemia 03/24/2017  . COPD with acute exacerbation ([place]) 10/22/2016  . Alcohol abuse 01/09/2013  . COPD exacerbation ([place]) 01/09/2013  . Smoker 12/16/2012  . Inguinal hernia unilateral, non-recurrent, right 05/01/2012  . Dyspepsia 02/05/2012  . Bipolar 1 disorder ([place]) 02/05/2012  . 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 COPD  GOLD III 02/03/2012  . Thrombocytopenia ([place]) 02/03/2012  . Depression   . 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 COLON SURGERY  04/11/11   Sigmoid colectomy  . COLOSTOMY REVISION  04/11/2011   Procedure: COLON RESECTION SIGMOID;  Surgeon:  [name], MD;  [facility]: [facility];  Service: General;  Laterality: N/A;  low anterior colon resection   . HERNIA REPAIR    . INGUINAL HERNIA REPAIR Right 05/01/2012   Procedure: HERNIA REPAIR INGUINAL ADULT;  Surgeon: [name], MD;  [facility]: [facility];  Service: General;  Laterality: Right;  . INSERTION OF MESH Right 05/01/2012   Procedure: INSERTION OF MESH;  Surgeon: [name], MD;  [facility];  Service: General;  Laterality: Right;  . PORT-A-CATH REMOVAL Left 05/01/2012   Procedure: REMOVAL Infusion Port;   Surgeon: [name], MD;  [facility];  Service: General;  Laterality: Left;  . PORTACATH PLACEMENT  05/02/2011   Procedure: INSERTION PORT-A-CATH;  Surgeon: [name], MD;  [facility];  Service: General;  Laterality: N/A;  . PROSTATE BIOPSY          Home Medications    Prior to Admission medications   Medication Sig Start Date End Date Taking? Authorizing Provider  albuterol (PROVENTIL HFA;VENTOLIN HFA) 108 (90 Base) MCG/ACT inhaler Inhale 2 puffs into the lungs every 6 (six) hours as needed for wheezing or shortness of breath. Patient taking differently: Inhale 2 puffs into the lungs 4 (four) times daily.  08/26/17   Isabelle Course, MD  albuterol (PROVENTIL) (5 MG/ML) 0.5% nebulizer solution Take 1 mL (5 mg total) by nebulization every 6 (six) hours as needed for wheezing or shortness of breath. 09/07/17   Danford, Suann Larry, MD  ALPRAZolam Duanne Moron) 0.5 MG tablet Take 1 tablet (0.5 mg total) by mouth 3 (three) times daily as needed for anxiety. 09/26/17   Drenda Freeze, MD  ARIPiprazole (ABILIFY) 5 MG tablet Take 1 tablet (5 mg total) by mouth daily with breakfast. 08/27/17   Isabelle Course, MD  Aspirin-Salicylamide-Caffeine [facility] HEADACHE POWDER PO) Take 1 packet by mouth as needed (for headaches).    [provider]  blood glucose meter kit and supplies KIT Dispense based on patient and insurance preference. Use up to four times daily as directed. (FOR ICD-9 250.00, 250.01). 09/07/17   Danford, Suann Larry, MD  doxycycline (VIBRA-TABS) 100 MG tablet Take 1 tablet (100 mg total) by mouth every 12 (twelve) hours. 09/25/17   Reyne Dumas, MD  escitalopram (LEXAPRO) 10 MG tablet Take 1 tablet (10 mg total) by mouth daily. Patient not taking: Reported on 09/22/2017 08/27/17   Isabelle Course, MD  folic acid (FOLVITE) 1 MG tablet Take 1 tablet (1 mg total) by mouth daily. 09/26/17   Reyne Dumas, MD  guaiFENesin (MUCINEX) 600 MG 12 hr tablet Take 1 tablet (600 mg total) by  mouth 2 (two) times daily. 09/25/17   Reyne Dumas, MD  lovastatin (MEVACOR) 20 MG tablet Take 1 tablet (20 mg total) by mouth at bedtime. 05/18/17   Nita Sells, MD  metFORMIN (GLUCOPHAGE) 500 MG tablet Take 1 tablet (500 mg total) by mouth 2 (two) times daily with a meal. Patient not taking: Reported on 09/22/2017 09/07/17   Edwin Dada, MD  mometasone-formoterol (DULERA) 100-5 MCG/ACT AERO Inhale 2 puffs into the lungs 2 (two) times daily. 05/18/17   Nita Sells, MD  nicotine (NICODERM CQ - DOSED IN MG/24 HOURS) 21 mg/24hr patch Place 1 patch (21 mg total) onto the skin daily. 09/26/17   Reyne Dumas, MD  OXYGEN Inhale 3 L into the lungs continuous.     [provider]  predniSONE (DELTASONE) 20 MG tablet Take 20 mg by mouth  2 (two) times daily.     [provider]  tamsulosin (FLOMAX) 0.4 MG CAPS capsule Take 0.4 mg by mouth daily.    [provider]  thiamine 100 MG tablet Take 1 tablet (100 mg total) by mouth daily. 09/26/17   Reyne Dumas, MD  tiotropium (SPIRIVA HANDIHALER) 18 MCG inhalation capsule Place 1 capsule (18 mcg total) into inhaler and inhale daily. 08/29/17   Mesner, Corene Cornea, MD    Family History Family History  Problem Relation Age of Onset  . Heart disease Father   . Lung cancer Maternal Uncle        smoked    Social History Social History   Tobacco Use  . Smoking status: Former Smoker    Packs/day: 0.10    Years: 46.00    Pack years: 4.60    Types: Cigarettes    Last attempt to quit: 05/21/2017    Years since quitting: 0.3  . Smokeless tobacco: Former Systems developer    Types: Chew  Substance Use Topics  . Alcohol use: Not Currently    Comment: h/o use in the past, no h/o heavy use  . Drug use: No     Allergies   Codeine   Review of Systems Review of Systems  Constitutional: Negative for chills and fever.  Respiratory: Positive for shortness of breath. Negative for cough and wheezing.   Cardiovascular: Negative for  chest pain, palpitations and leg swelling.  Neurological: Positive for light-headedness. Negative for syncope.  Psychiatric/Behavioral: The patient is nervous/anxious.   All other systems reviewed and are negative.    Physical Exam Updated Vital Signs BP 110/73   Pulse 82   Temp (!) 97.5 F (36.4 C) (Oral)   Resp (!) 23   SpO2 98%   Physical Exam  Constitutional: He is oriented to person, place, and time. He appears well-developed and well-nourished. He appears distressed.  Anxious and tremulous. Able to give a coherent history  HENT:  Head: Normocephalic and atraumatic.  Eyes: Pupils are equal, round, and reactive to light. Conjunctivae are normal. Right eye exhibits no discharge. Left eye exhibits no discharge. No scleral icterus.  Neck: Normal range of motion.  Cardiovascular: Normal rate and regular rhythm.  Pulmonary/Chest: Effort normal. Tachypnea noted. No respiratory distress. He has wheezes (mild scattered expiratory wheezes).  Abdominal: Soft. Bowel sounds are normal. He exhibits no distension. There is no tenderness.  Musculoskeletal:  No peripheral edema  Neurological: He is alert and oriented to person, place, and time.  Skin: Skin is warm and dry.  Psychiatric: His behavior is normal. His mood appears anxious.  Nursing note and vitals reviewed.    ED Treatments / Results  Labs (all labs ordered are listed, but only abnormal results are displayed) Labs Reviewed  BASIC METABOLIC PANEL - Abnormal; Notable for the following components:      Result Value   CO2 34 (*)    Glucose, Bld 164 (*)    Calcium 8.0 (*)    All other components within normal limits  CBC WITH DIFFERENTIAL/PLATELET - Abnormal; Notable for the following components:   Abs Immature Granulocytes 0.3 (*)    All other components within normal limits  HEPATIC FUNCTION PANEL - Abnormal; Notable for the following components:   Total Protein 5.3 (*)    All other components within normal limits    ETHANOL  RAPID URINE DRUG SCREEN, HOSP PERFORMED  I-STAT TROPONIN, ED    EKG EKG Interpretation  Date/Time:  Sunday September 28 2017  10:32:35 EDT Ventricular Rate:  89 PR Interval:    QRS Duration: 85 QT Interval:  341 QTC Calculation: 415 R Axis:   56 Text Interpretation:  Sinus rhythm Short PR interval No significant change since last tracing Confirmed by Isla Pence [phone]) on 09/28/2017 10:45:22 AM Also confirmed by Isla Pence [phone]), editor Philomena Doheny [phone])  on 09/28/2017 10:58:47 AM   Radiology Dg Chest Port 1 View  Result Date: 09/28/2017 CLINICAL DATA:  Shortness of breath. EXAM: PORTABLE CHEST 1 VIEW COMPARISON:  September 26, 2017. FINDINGS: Hyperinflation of the lungs. Diaphragm flattening. The heart, hila, mediastinum, lungs, and pleura are otherwise unremarkable. IMPRESSION: Hyperinflation of the lungs suggesting COPD or emphysema. No other acute abnormalities. Electronically Signed   By: Dorise Bullion III M.D   On: 09/28/2017 11:13   Dg Chest Port 1 View  Result Date: 09/26/2017 CLINICAL DATA:  One month history of heartburn and shortness of breath with increasing severity over the past week. History of COPD, current smoker. The patient porch being on home oxygen at home and is on prednisone. History of colonic and prostate malignancy. EXAM: PORTABLE CHEST 1 VIEW COMPARISON:  PA and lateral chest x-ray of September 22, 2017 FINDINGS: The lungs are hyperinflated with hemidiaphragm flattening. There is no focal infiltrate. There is no pleural effusion. The heart and pulmonary vascularity are normal. The mediastinum is normal in width. There are old deformities of the right third through fifth ribs posterolaterally. IMPRESSION: COPD. No pneumonia, CHF, nor other acute cardiopulmonary abnormality. Electronically Signed   By: David  Martinique M.D.   On: 09/26/2017 12:48    Procedures Procedures (including critical care time)  Medications Ordered in ED Medications   magnesium sulfate IVPB 2 g 50 mL (2 g Intravenous New Bag/Given 09/28/17 1121)  predniSONE (DELTASONE) tablet 60 mg (60 mg Oral Given 09/28/17 1121)  ipratropium-albuterol (DUONEB) 0.5-2.5 (3) MG/3ML nebulizer solution 3 mL (3 mLs Nebulization Given 09/28/17 1121)  LORazepam (ATIVAN) injection 1 mg (1 mg Intravenous Given 09/28/17 1121)     Initial Impression / Assessment and Plan / ED Course  I have reviewed the triage vital signs and the nursing notes.  Pertinent labs & imaging results that were available during my care of the patient were reviewed by me and considered in my medical decision making (see chart for details).  62 year old male presents with SOB, wheezing, anxiety.  He is tachypneic and tremulous on initial exam. Wheezing was heard on initial lung exam. Duoneb, prednisone, Mag ordered. He was also given Ativan for his visible anxiety.   12:28 PM rechecked lungs. They are CTA. He is still mildly tremulous - likely from neb. He was given ativan and states he feels like a "zombie". Will attempt to ambulate to check sats.  Pt was ambulated and maintained his sats but became very symptomatic and his HR went up to 120s with exertion. Will request admission.  2:06 PM Spoke with Dr. Kyung Bacca who will admit.   Final Clinical Impressions(s) / ED Diagnoses   Final diagnoses:  COPD exacerbation [facility])  Anxiety    ED Discharge Orders    None       Recardo Evangelist, PA-C 09/28/17 1406    Isla Pence, MD 09/28/17 1511

## 2017-09-29 DIAGNOSIS — J9602 Acute respiratory failure with hypercapnia: Secondary | ICD-10-CM

## 2017-09-29 DIAGNOSIS — J9611 Chronic respiratory failure with hypoxia: Secondary | ICD-10-CM

## 2017-09-29 DIAGNOSIS — J9612 Chronic respiratory failure with hypercapnia: Secondary | ICD-10-CM

## 2017-09-29 LAB — BLOOD GAS, ARTERIAL
ACID-BASE EXCESS: 11.7 mmol/L — AB (ref 0.0–2.0)
Bicarbonate: 37.8 mmol/L — ABNORMAL HIGH (ref 20.0–28.0)
Drawn by: 414221
O2 CONTENT: 3 L/min
O2 SAT: 97.8 %
PATIENT TEMPERATURE: 98.6
PCO2 ART: 72.4 mmHg — AB (ref 32.0–48.0)
PO2 ART: 103 mmHg (ref 83.0–108.0)
pH, Arterial: 7.338 — ABNORMAL LOW (ref 7.350–7.450)

## 2017-09-29 LAB — CBC
HCT: 38.3 % — ABNORMAL LOW (ref 39.0–52.0)
Hemoglobin: 12.3 g/dL — ABNORMAL LOW (ref 13.0–17.0)
MCH: 30.7 pg (ref 26.0–34.0)
MCHC: 32.1 g/dL (ref 30.0–36.0)
MCV: 95.5 fL (ref 78.0–100.0)
PLATELETS: 153 10*3/uL (ref 150–400)
RBC: 4.01 MIL/uL — AB (ref 4.22–5.81)
RDW: 12.4 % (ref 11.5–15.5)
WBC: 6.8 10*3/uL (ref 4.0–10.5)

## 2017-09-29 LAB — BASIC METABOLIC PANEL
Anion gap: 6 (ref 5–15)
BUN: 12 mg/dL (ref 8–23)
CALCIUM: 8.1 mg/dL — AB (ref 8.9–10.3)
CO2: 37 mmol/L — AB (ref 22–32)
CREATININE: 0.61 mg/dL (ref 0.61–1.24)
Chloride: 97 mmol/L — ABNORMAL LOW (ref 98–111)
GFR calc non Af Amer: >60 mL/min (ref >60)
Glucose, Bld: 220 mg/dL — ABNORMAL HIGH (ref 70–99)
Potassium: 4.6 mmol/L (ref 3.5–5.1)
SODIUM: 140 mmol/L (ref 135–145)

## 2017-09-29 LAB — GLUCOSE, CAPILLARY
GLUCOSE-CAPILLARY: 121 mg/dL — AB (ref 70–99)
Glucose-Capillary: 208 mg/dL — ABNORMAL HIGH (ref 70–99)
Glucose-Capillary: 208 mg/dL — ABNORMAL HIGH (ref 70–99)
Glucose-Capillary: 266 mg/dL — ABNORMAL HIGH (ref 70–99)
Glucose-Capillary: 183 mg/dL — ABNORMAL HIGH (ref 70–99)

## 2017-09-29 MED ORDER — IPRATROPIUM-ALBUTEROL 0.5-2.5 (3) MG/3ML IN SOLN
3.0000 mL | RESPIRATORY_TRACT | Status: DC | PRN
  Filled 2017-09-29: qty 3

## 2017-09-29 MED ORDER — BUDESONIDE 0.25 MG/2ML IN SUSP
0.2500 mg | Freq: Two times a day (BID) | RESPIRATORY_TRACT | Status: DC
  Administered 2017-09-29 – 2017-10-03 (×9): 0.25 mg via RESPIRATORY_TRACT
  Filled 2017-09-29 (×11): qty 2

## 2017-09-29 MED ORDER — IPRATROPIUM-ALBUTEROL 0.5-2.5 (3) MG/3ML IN SOLN
3.0000 mL | Freq: Four times a day (QID) | RESPIRATORY_TRACT | Status: DC | PRN
  Administered 2017-09-29: 3 mL via RESPIRATORY_TRACT

## 2017-09-29 NOTE — Progress Notes (Signed)
PROGRESS NOTE  Taylor [name] JQB:[id number] DOB: 1955/11/11 DOA: 09/28/2017 PCP: [facility]   LOS: 1 day   Brief Narrative / Interim history: 62 year old male, homeless currently living in a motel, with history of COPD and chronic hypoxic respiratory failure with 3 L nasal cannula at home, tobacco abuse, hyperlipidemia, anxiety who comes to the hospital with chief complaint of progressive shortness of breath.    Subjective: -Breathing a little bit better this morning, however complains of significant anxiety regarding his life.  He does not know what to do, he is currently living in a motel and feels like his activity levels have been progressively limited by his worsening respiratory status.  Assessment & Plan: Principal Problem:   COPD with acute exacerbation ([place]) Active Problems:   COPD  GOLD III   Dyspepsia   Smoker   COPD exacerbation (HCC)   Acute respiratory failure with hypercapnia (HCC)   Chronic respiratory failure with hypoxia and hypercapnia (HCC)   COPD exacerbation with acute on chronic hypoxic and hypercarbic respiratory failure -Continue nebulizers Pulmicort, duo nebs, steroids with prednisone as well as Levaquin -Slightly hypercarbic this morning however patient is alert and oriented x4, does not appear to have any issues.  Encourage sitting up and ambulating  Tobacco abuse -Encourage cessation  Anxiety -Continue Abilify and alprazolam  Bipolar disorder -Continue Abilify  Hyperlipidemia -Continue statin  BPH -Continue Flomax  Homelessness -Social worker consult   Scheduled Meds: . ARIPiprazole  5 mg Oral Q breakfast  . budesonide (PULMICORT) nebulizer solution  0.25 mg Nebulization BID  . enoxaparin (LOVENOX) injection  40 mg Subcutaneous Q24H  . escitalopram  10 mg Oral Daily  . folic acid  1 mg Oral Daily  . guaiFENesin  600 mg Oral BID  . insulin aspart  0-9 Units Subcutaneous TID WC  . ipratropium-albuterol  3 mL  Nebulization Once  . nicotine  21 mg Transdermal Daily  . pravastatin  20 mg Oral q1800  . predniSONE  40 mg Oral Q breakfast  . tamsulosin  0.4 mg Oral Daily  . thiamine  100 mg Oral Daily  . tiotropium  18 mcg Inhalation Daily   Continuous Infusions: . levofloxacin (LEVAQUIN) IV 750 mg (09/28/17 1748)   PRN Meds:.ALPRAZolam  DVT prophylaxis: Lovenox Code Status: DNR Family Communication: no family at bedside Disposition Plan: home when ready   Consultants:   None   Procedures:   none  Antimicrobials:  Levaquin 9/8 >>   Objective: Vitals:   09/28/17 1612 09/28/17 2357 09/29/17 0814 09/29/17 0815  BP:  118/87  116/73  Pulse:  78  78  Resp:  19  18  Temp:  97.8 F (36.6 C)  97.7 F (36.5 C)  TempSrc:  Oral  Oral  SpO2:  100% 97% 98%  Weight: 70.5 kg     Height: 5\' 11"  (1.803 m)       Intake/Output Summary (Last 24 hours) at 09/29/2017 1022 Last data filed at 09/29/2017 0900 Gross per 24 hour  Intake 1040 ml  Output 2415 ml  Net -1375 ml   Filed Weights   09/28/17 1612  Weight: 70.5 kg    Examination:  Constitutional: NAD, appears anxious Eyes:  lids and conjunctivae normal ENMT: Mucous membranes are moist.  Neck: normal, supple, no masses, no thyromegaly Respiratory: slight end expiratory wheezing Cardiovascular: Regular rate and rhythm, no murmurs / rubs / gallops. No LE edema. Abdomen: no tenderness. Bowel sounds positive.  Skin: no rashes Neurologic:  non focal   Data Reviewed: I have independently reviewed following labs and imaging studies  CBC: Recent Labs  Lab 09/22/17 1530 09/23/17 0455 09/26/17 1207 09/28/17 1122 09/29/17 0311  WBC 8.6 8.8 7.7 7.6 6.8  NEUTROABS  --   --  6.2 5.5  --   HGB 12.5* 12.0* 13.2 13.3 12.3*  HCT 40.1 38.5* 42.0 42.7 38.3*  MCV 97.8 98.0 96.6 98.8 95.5  PLT 149* 153 156 162 502   Basic Metabolic Panel: Recent Labs  Lab 09/22/17 1530 09/23/17 0455 09/26/17 1207 09/28/17 1122 09/29/17 0311  NA 142  142 143 144 140  K 4.9 4.3 4.6 3.8 4.6  CL 101 101 97* 102 97*  CO2 34* 36* 38* 34* 37*  GLUCOSE 253* 218* 246* 164* 220*  BUN 24* 22 9 21 12   CREATININE 0.85 0.72 0.79 0.71 0.61  CALCIUM 8.4* 8.5* 8.5* 8.0* 8.1*   GFR: Estimated Creatinine Clearance: 95.5 mL/min (by C-G formula based on SCr of 0.61 mg/dL). Liver Function Tests: Recent Labs  Lab 09/23/17 0455 09/26/17 1207 09/28/17 1134  AST 17 18 21   ALT 37 36 36  ALKPHOS 44 46 44  BILITOT 0.5 0.6 0.5  PROT 5.1* 5.3* 5.3*  ALBUMIN 3.2* 3.4* 3.5   No results for input(s): LIPASE, AMYLASE in the last 168 hours. No results for input(s): AMMONIA in the last 168 hours. Coagulation Profile: No results for input(s): INR, PROTIME in the last 168 hours. Cardiac Enzymes: No results for input(s): CKTOTAL, CKMB, CKMBINDEX, TROPONINI in the last 168 hours. BNP (last 3 results) No results for input(s): PROBNP in the last 8760 hours. HbA1C: Recent Labs    09/28/17 1122  HGBA1C 8.2*   CBG: Recent Labs  Lab 09/25/17 1117 09/28/17 1641 09/28/17 2155 09/29/17 0630 09/29/17 0745  GLUCAP 181* 290* 306* 208* 208*   Lipid Profile: No results for input(s): CHOL, HDL, LDLCALC, TRIG, CHOLHDL, LDLDIRECT in the last 72 hours. Thyroid Function Tests: No results for input(s): TSH, T4TOTAL, FREET4, T3FREE, THYROIDAB in the last 72 hours. Anemia Panel: No results for input(s): VITAMINB12, FOLATE, FERRITIN, TIBC, IRON, RETICCTPCT in the last 72 hours. Urine analysis:    Component Value Date/Time   COLORURINE YELLOW 06/13/2017 0203   APPEARANCEUR CLEAR 06/13/2017 0203   LABSPEC 1.018 06/13/2017 0203   PHURINE 6.0 06/13/2017 0203   GLUCOSEU 50 (A) 06/13/2017 0203   HGBUR NEGATIVE 06/13/2017 0203   BILIRUBINUR NEGATIVE 06/13/2017 0203   KETONESUR NEGATIVE 06/13/2017 0203   PROTEINUR NEGATIVE 06/13/2017 0203   UROBILINOGEN 1.0 01/09/2013 1843   NITRITE NEGATIVE 06/13/2017 0203   LEUKOCYTESUR NEGATIVE 06/13/2017 0203   Sepsis  Labs: Invalid input(s): PROCALCITONIN, LACTICIDVEN  No results found for this or any previous visit (from the past 240 hour(s)).    Radiology Studies: Dg Chest Port 1 View  Result Date: 09/28/2017 CLINICAL DATA:  Shortness of breath. EXAM: PORTABLE CHEST 1 VIEW COMPARISON:  September 26, 2017. FINDINGS: Hyperinflation of the lungs. Diaphragm flattening. The heart, hila, mediastinum, lungs, and pleura are otherwise unremarkable. IMPRESSION: Hyperinflation of the lungs suggesting COPD or emphysema. No other acute abnormalities. Electronically Signed   By: Dorise Bullion III M.D   On: 09/28/2017 11:13     Marzetta Board, MD, PhD [facility] Pager [phone]  If 7PM-7AM, please contact night-coverage [email or web address] Password [email or web address] 09/29/2017, 10:22 AM

## 2017-09-29 NOTE — Evaluation (Signed)
Physical Therapy Evaluation Patient Details Name: Taylor [name] MRN: [id number] DOB: May 02, 1955 Today's Date: 09/29/2017   History of Present Illness  Patient is a 62 y/o male with numerouse visits to ED in last 2 weeks admitted with primary complaints of SOB with hypoxia and anxiety. PMH significant for bipolar disorder, advanced COPD on home oxygen typically 2 L/min, depression, tobacco abuse,Colon and prostate cancer who was recently discharged from the hospital with exacerbation of COPD. Chest x-ray showed no active findings.  Clinical Impression  Pt has had multiple recent trips to ED/admissions in the last month related to his COPD. Pt reports currently living in hotel but that CSW is working on placement in an environment with better support for his COPD. Pt continues to be independent with ambulation of very short distances and in basic ADLs. Pt requires min guard to supervision for bed mobility, transfers and ambulation of 200 feet with x3 standing rest breaks. PT concurs with CSW assessment of pt's needs for additional support at d/c as well as addition of HHPT rehab to improve strength and endurance as well as work on energy conservation. PT will continue to follow acutely.       Follow Up Recommendations Home health PT;Supervision - Intermittent    Equipment Recommendations  None recommended by PT    Recommendations for Other Services       Precautions / Restrictions Precautions Precautions: Fall Precaution Comments: watch O2 sats, reports he has never fallen Restrictions Weight Bearing Restrictions: No      Mobility  Bed Mobility Overal bed mobility: Modified Independent Bed Mobility: Supine to Sit     Supine to sit: Modified independent (Device/Increase time);HOB elevated     General bed mobility comments: increased effort use of bed rails,   Transfers Overall transfer level: Needs assistance Equipment used: None Transfers: Sit to/from Stand Sit to Stand:  Supervision         General transfer comment: supervision for safety  Ambulation/Gait Ambulation/Gait assistance: Min guard;Supervision Gait Distance (Feet): 200 Feet Assistive device: None Gait Pattern/deviations: Step-through pattern;Decreased stride length;Narrow base of support;Shuffle Gait velocity: slowed Gait velocity interpretation: <1.8 ft/sec, indicate of risk for recurrent falls General Gait Details: supervision to min guard, ambulated 200 feet total with 3x standing rest breaks to recover from SoB, as SoB increased pt utilized handrails in hallway        Balance Overall balance assessment: Needs assistance Sitting-balance support: Feet supported Sitting balance-Leahy Scale: Good     Standing balance support: No upper extremity supported Standing balance-Leahy Scale: Fair                               Pertinent Vitals/Pain Pain Assessment: No/denies pain    Home Living Family/patient expects to be discharged to:: Unsure(reports CSW is working on placement other than hotel ) Living Arrangements: Alone               Additional Comments: pt reports living at [facility], and that [place] is working on facility placement, pt was previously resident at group home     Prior Function Level of Independence: Independent         Comments: 3L home O2. Ambulation distance limited by [facility]        Extremity/Trunk Assessment   Upper Extremity Assessment Upper Extremity Assessment: Defer to OT evaluation    Lower Extremity Assessment Lower Extremity Assessment: Overall WFL for tasks assessed;Generalized weakness  Communication   Communication: No difficulties  Cognition Arousal/Alertness: Awake/alert Behavior During Therapy: WFL for tasks assessed/performed Overall Cognitive Status: Within Functional Limits for tasks assessed                                        General Comments General comments (skin integrity, edema,  etc.): Pt on 2L supplemental O2 via [place] on entry with SaO2 94%O2, HR 95bpm, with ambulation on 2L SaO2 dropped to 87%O2, HR increased to max of 105 bpm, pt with standing rest break, with vc for pursed lipped breathing especially inhale through nose, SaO2 rebounded to 96%O2, with ambulation back to room SaO2 on 3L O2 remainied >91%O2        Assessment/Plan    PT Assessment Patient needs continued PT services  PT Problem List Cardiopulmonary status limiting activity;Decreased activity tolerance;Decreased mobility;Decreased strength       PT Treatment Interventions DME instruction;Gait training;Functional mobility training;Therapeutic activities;Therapeutic exercise;Balance training;Cognitive remediation;Patient/family education    PT Goals (Current goals can be found in the Care Plan section)  Acute Rehab PT Goals Patient Stated Goal: to live some place where someone could help him out when he is having trouble breathing PT Goal Formulation: With patient Time For Goal Achievement: 10/13/17 Potential to Achieve Goals: Fair    Frequency Min 3X/week   Barriers to discharge Decreased caregiver support unsure of d/c location       AM-PAC PT "6 Clicks" Daily Activity  Outcome Measure Difficulty turning over in bed (including adjusting bedclothes, sheets and blankets)?: None Difficulty moving from lying on back to sitting on the side of the bed? : A Little Difficulty sitting down on and standing up from a chair with arms (e.g., wheelchair, bedside commode, etc,.)?: A Little Help needed moving to and from a bed to chair (including a wheelchair)?: A Little Help needed walking in hospital room?: A Little Help needed climbing 3-5 steps with a railing? : A Lot 6 Click Score: 18    End of Session Equipment Utilized During Treatment: Gait belt;Oxygen Activity Tolerance: Patient limited by fatigue Patient left: Other (comment)(left pt at sink in room with OT) Nurse Communication: Mobility  status PT Visit Diagnosis: Other abnormalities of gait and mobility (R26.89);Muscle weakness (generalized) (M62.81)    Time: 3825-0539 PT Time Calculation (min) (ACUTE ONLY): 20 min   Charges:   PT Evaluation $PT Eval Moderate Complexity: 1 Mod          Sommer Spickard B. Migdalia Dk PT, DPT Acute Rehabilitation Services Pager [phone] Office [phone]   [place] 09/29/2017, 9:29 AM

## 2017-09-29 NOTE — Progress Notes (Signed)
OT Evaluation  PTA, pt living in a hotel and states that he was not able to complete his self care due to his difficulty breathing and feeling like he was going to "give out". Pt able to complete ADL task @ sink level sitting with SpO2 @ 93 on 3L; HR 100. 3/4 dyspnea; frequently taking breaks. Pt endorses that he continues to smoke. Recommended smoking cessation. Pt appears anxious during session. Will follow acutely and recommend follow up [place].  Recommend Psych consult.    09/29/17 0900  OT Visit Information  Last OT Received On 09/29/17  Assistance Needed +1  History of Present Illness Patient is a 62 y/o male with numerouse visits to ED in last 2 weeks admitted with primary complaints of SOB with hypoxia and anxiety. PMH significant for bipolar disorder, advanced COPD on home oxygen typically 2 L/min, depression, tobacco abuse,Colon and prostate cancer who was recently discharged from the hospital with exacerbation of COPD. Chest x-ray showed no active findings.  Precautions  Precautions Fall  Precaution Comments watch O2 sats, reports he has never fallen  Restrictions  Weight Bearing Restrictions No  Home Living  Family/patient expects to be discharged to: [facility] Chancy (reports CSW is working on placement other than hotel )  Film/video editor Alone  Additional Comments pt reports living at [facility], and that [place] [place] is working on facility placement, pt was previously resident at group home   Prior Function  Level of [place] home O2. Ambulation distance limited by SoB  Communication  Communication No difficulties  Pain Assessment  Pain Assessment No/denies pain  Cognition  Arousal/Alertness Awake/alert  Behavior During Therapy Anxious  Overall Cognitive Status Within Functional Limits for tasks assessed  Upper Extremity Assessment  Upper Extremity Assessment Overall WFL for tasks assessed  Lower Extremity Assessment  Lower Extremity Assessment Defer to  PT evaluation  Cervical / Trunk Assessment  Cervical / Trunk Assessment Other exceptions  Cervical / Trunk Exceptions rounded shoulders and forward head.   ADL  Overall ADL's  Needs assistance/impaired  Grooming Modified independent  Upper Body Bathing Set up;Sitting  Lower Body Bathing Set up;Supervison/ safety;Sit to/from stand  Upper Body Dressing  Set up;Supervision/safety;Sitting  Lower Body Dressing Set up;Supervision/safety;Sit to/from Environmental education officer guard;Ambulation  Toileting- Clothing Manipulation and Hygiene Modified independent  Functional mobility during ADLs Min guard  General ADL Comments 3/4 DOE during ADL tasks; sat to brush teeth  Bed Mobility  General bed mobility comments OOB walking with PT  Transfers  Overall transfer level Needs assistance  Equipment used None  Transfers Sit to/from Stand  Sit to Stand Supervision  General transfer comment supervision for safety  Balance  Overall balance assessment Needs assistance  Sitting-balance support Feet supported  Sitting balance-Leahy Scale Good  Standing balance support No upper extremity supported  Standing balance-Leahy Scale Fair  General Comments  General comments (skin integrity, edema, etc.) ADL completed on 3L wtih SpO2 remaining @ 93.  Exercises  Exercises Other exercises  Other Exercises  Other Exercises pursed lip breathing  OT - End of Session  Equipment Utilized During Treatment Oxygen (3L)  Activity Tolerance Patient tolerated treatment well  Patient left in chair;with call bell/phone within reach  Nurse Communication Mobility status  OT Assessment  OT Recommendation/Assessment Patient needs continued OT Services  OT Visit Diagnosis Unsteadiness on feet (R26.81);Muscle weakness (generalized) (M62.81);Other (comment)  OT Problem List Decreased activity tolerance;Cardiopulmonary status limiting activity  Barriers to Discharge  (homeless)  OT  Plan  OT Frequency (ACUTE ONLY) Min  2X/week  OT Treatment/Interventions (ACUTE ONLY) Self-care/ADL training;Therapeutic exercise;Energy conservation;DME and/or AE instruction;Therapeutic activities;Patient/family education  AM-PAC OT "6 Clicks" Daily Activity Outcome Measure  Help from another person eating meals? 4  Help from another person taking care of personal grooming? 4  Help from another person toileting, which includes using toliet, bedpan, or urinal? 4  Help from another person bathing (including washing, rinsing, drying)? 3  Help from another person to put on and taking off regular upper body clothing? 4  Help from another person to put on and taking off regular lower body clothing? 3  6 Click Score 22  ADL G Code Conversion CJ  OT Recommendation  Follow Up Recommendations Supervision - Intermittent;Home health OT  OT Equipment Tub/shower seat  Individuals Consulted  Consulted and Agree with Results and Recommendations Patient  Acute Rehab OT Goals  Patient Stated Goal to live some place where someone could help him out when he is having trouble breathing  OT Goal Formulation With patient  Time For Goal Achievement 10/13/17  Potential to Achieve Goals Good  OT Time Calculation  OT Start Time (ACUTE ONLY) 0911  OT Stop Time (ACUTE ONLY) 0926  OT Time Calculation (min) 15 min  OT General Charges  $OT Visit 1 Visit  OT Evaluation  $OT Eval Low Complexity 1 Low  Written Expression  Dominant Hand Right  Maurie Boettcher, OT/L  OT Clinical Specialist [phone]

## 2017-09-29 NOTE — Social Work (Addendum)
CSW spoke with APS- they do not currently have a case open for pt.  [place] worker is checking on placement CSW to see if they were assisting pt with locating another group home.   4:18pm- Venia Minks, CSW was told that pt was not interested in placement, there is currently nobody working with pt on placement. If pt is interested in looking for assisted living or another placement CSW will call back and speak with Natale Milch Long [phone]. CSW and DSS unable to provide financial assistance for hotel stay. Pt is difficult placement due to social hx.   Alexander Mt, [place] Work [phone]

## 2017-09-29 NOTE — Progress Notes (Signed)
Inpatient Diabetes Program Recommendations  AACE/ADA: New Consensus Statement on Inpatient Glycemic Control (2015)  Target Ranges:  Prepandial:   less than 140 mg/dL      Peak postprandial:   less than 180 mg/dL (1-2 hours)      Critically ill patients:  140 - 180 mg/dL   Lab Results  Component Value Date   GLUCAP 266 (H) 09/29/2017   HGBA1C 8.2 (H) 09/28/2017    Review of Glycemic ControlResults for MCADOO, MUZQUIZ (MRN [id number]) as of 09/29/2017 15:11  Ref. Range 09/28/2017 16:41 09/28/2017 21:55 09/29/2017 06:30 09/29/2017 07:45 09/29/2017 11:27  Glucose-Capillary Latest Ref Range: 70 - 99 mg/dL 290 (H) 306 (H) 208 (H) 208 (H) 266 (H)    Diabetes history: Type 2 DM  Outpatient Diabetes medications: Metformin 500 mg bid Current orders for Inpatient glycemic control:  Novolog sensitive tid with meals Inpatient Diabetes Program Recommendations:   Consider adding Lantus 10 units daily while on steroids. Also, may consider adding Novolog meal coverage 3 units tid with meals (hold if patient eats less than 50%).   Thanks,  Adah Perl, RN, BC-ADM Inpatient Diabetes Coordinator Pager [phone] (8a-5p)

## 2017-09-29 NOTE — Care Management Note (Addendum)
Case Management Note  Patient Details  Name: Taylor [name] MRN: [id number] Date of Birth: 12/01/1955  Subjective/Objective:  From hotel, presents with copd ex, anxiety,  NCM spoke with patient, he stays in the [place] hotel now, he has oxygen concentrator and neb machine with [employer].  He states his Case worker is suppose to be finding him some place to live and he is not sure where she is in this process.  The Group home he was previously in was closed down.   NCM contacted [name] to see if she could help assist with this thru DSS, she is contacting patient's case worker at [place]. Per pt eval rec HHPT/ HHOT, he chose [facility] from agency list.  Referral given to [facility] with [facility] for [place], [place], [place].  Patient has not been to the [facility] urgent care in 4 years, he does not really have a MD there.  He has been to the [facility] , NCM will get him a follow up apt there, his apt is 10/7 at 1:50 at the [facility]. Will need address where patient is going to after discharge in order to receive [facility] services.   Will need [place] orders prior to dc     9/12 Shantil Vallejo RN, BSN- per [name] with [facility], she is taking patient off of her referral list because we have no address of where patient is going. He will need to be referred again once we figure out address of where he is going at dc.                  Action/Plan: NCM will follow for transition of care needs.   Expected Discharge Date:                  Expected Discharge Plan:  Home/Self Care  In-House Referral:  Clinical Social Work  Discharge planning Services  CM Consult  Post Acute Care Choice:  Home Health Choice offered to:  Patient  DME Arranged:    DME Agency:     HH Arranged:  PT, OT, RN [place] Agency:  [place]  Status of Service:  In process, will continue to follow  If discussed at Long Length of Stay Meetings, dates discussed:    Additional Comments:  [name], RN 09/29/2017,  3:34 PM

## 2017-09-29 NOTE — Progress Notes (Addendum)
Initial Nutrition Assessment  DOCUMENTATION CODES:   Non-severe (moderate) malnutrition in context of chronic illness  INTERVENTION:    Glucerna Shake po BID, each supplement provides 220 kcal and 10 grams of protein  NUTRITION DIAGNOSIS:   Moderate Malnutrition related to chronic illness(COPD) as evidenced by moderate muscle depletion, moderate fat depletion   GOAL:   Patient will meet greater than or equal to 90% of their needs  MONITOR:   PO intake, Supplement acceptance, Labs, Weight trends, I & O's  REASON FOR ASSESSMENT:   Consult COPD Protocol  ASSESSMENT:   62 yo homeless Male currently living in a motel, with history of COPD and chronic hypoxic respiratory failure with 3 L nasal cannula at home, tobacco abuse, hyperlipidemia, anxiety who comes to the hospital with chief complaint of progressive shortness of breath.    RD briefly spoke with pt at bedside. He appears anxious. Shaky. Reports he is eating slowly however is appetite is good. Pt reveals he's having a hard time breathing.  States he's lost weight but unable to identify amount/time frame associated with loss. Per readings below pt's weight has been stable since 06/2017. UBW is 180 lbs. Pt likes nutrition supplements. Amenable to receive here in the hospital.  Medications include folvite and thiamine. Labs reviewed. CBG's 208-208-266.  Pt assessed per Clinical Nutrition in 05/2017.  Identified with malnutrition which is ongoing.  NUTRITION - FOCUSED PHYSICAL EXAM:    Most Recent Value  Orbital Region  Mild depletion  Upper Arm Region  Moderate depletion  Thoracic and Lumbar Region  Mild depletion  Buccal Region  Mild depletion  Temple Region  Mild depletion  Clavicle Bone Region  Moderate depletion  Clavicle and Acromion Bone Region  Moderate depletion  Scapular Bone Region  Mild depletion  Dorsal Hand  Mild depletion  Patellar Region  Moderate depletion  Anterior Thigh Region  Moderate  depletion  Posterior Calf Region  Moderate depletion  Edema (RD Assessment)  None    Diet Order:   Diet Order            Diet Carb Modified Fluid consistency: Thin; Room service appropriate? Yes  Diet effective now             EDUCATION NEEDS:   No education needs have been identified at this time  Skin:  Skin Assessment: Reviewed RN Assessment  Last BM:  9/8  Height:   Ht Readings from Last 1 Encounters:  09/28/17 5\' 11"  (1.803 m)   Weight:   Wt Readings from Last 1 Encounters:  09/28/17 70.5 kg   Wt Readings from Last 10 Encounters:  09/28/17 70.5 kg  09/22/17 70.7 kg  09/21/17 70.8 kg  09/15/17 70.4 kg  09/07/17 71 kg  08/31/17 71.1 kg  08/29/17 72.6 kg  08/24/17 72.5 kg  08/13/17 72.6 kg  06/23/17 72.6 kg   BMI:  Body mass index is 21.68 kg/m.  Estimated Nutritional Needs:   Kcal:  1800-2000  Protein:  90-105 gm  Fluid:  1.8-2.0 L  Arthur Holms, RD, LDN Pager #: [phone] After-Hours Pager #: [phone]

## 2017-09-30 LAB — GLUCOSE, CAPILLARY
GLUCOSE-CAPILLARY: 210 mg/dL — AB (ref 70–99)
GLUCOSE-CAPILLARY: 114 mg/dL — AB (ref 70–99)
GLUCOSE-CAPILLARY: 308 mg/dL — AB (ref 70–99)
GLUCOSE-CAPILLARY: 215 mg/dL — AB (ref 70–99)

## 2017-09-30 NOTE — Clinical Social Work Note (Signed)
Clinical Social Work Assessment  Patient Details  Name: Taylor [name] MRN: [id number] Date of Birth: [birth year]  Date of referral:  09/30/17               Reason for consult:  Housing Concerns/Homelessness, Frequent Admissions / ED Visits, Discharge Planning, [employer]                Permission sought to share information with:  Facility Sport and exercise psychologist, Family Supports Permission granted to share information::  Yes, Verbal Permission Granted  Name::     Carrolyn Meiers  Agency::  [place] [facility]   Relationship::  friend  Contact Information:  [phone]  Housing/Transportation Living arrangements for the past 2 months:  Hotel/Motel, Group Home Source of Information:  Patient Patient Interpreter Needed:  None Criminal Activity/Legal Involvement Pertinent to Current Situation/Hospitalization:  No - Comment as needed Significant Relationships:  Friend Lives with:  Self Do you feel safe going back to the place where you live?  No Need for family participation in patient care:  No (Coment)  Care giving concerns:  Pt now has 15 admissions in the last 6 months. Pt requiring nasal canula and continues to smoke. Pt states that he lacks permanent housing and has been staying in a motel since the group home he was living in closed. Pt seeking support with finding placement and supportive housing.    Social Worker assessment / plan:  CSW met with pt at bedside, introduced self and role- pt states understanding as had multiple contacts with CSWs during each previous admission. Pt states that he has been living in a hotel and has had troubles making sure he gets the appropriate services that he needs (food, transportation, etc). Pt states that he would like someone to assist him with finding more permanent housing. Pt unable to stay with friends and states family will no longer pick up the phone to assist him.   CSW explained limitations to placement due to pt insurance  coverage, functional status, and registration as a sex offender. CSW and DSS unable to give cash assistance to pt for hotel room, although he does get a check from disability that he usually uses to pay for bills and hotel. Pt states that he has worked with Venia Minks through [place] in the past but does not recall what the result of that is (for reference CSW had spoken with DSS states that pt had refused previous placement offer given). Pt accepting of resources and understanding of limitations for CSW to find placement. CSW will refer pt again to [employer] through [place]. CSW gave pt information regarding housing support, local shelters, and assistance programs. Pt states he has some of this information from the previous CSW's but accepted.   Pt states he will call his friend in order to see if he can get pt equipment from hotel, and understands that [place] will f/u after making referral back to DSS.   Employment status:  Unemployed, Disabled (Comment on whether or not currently receiving Disability) Insurance information:    PT Recommendations:  Home with [place] / Referral to community resources:  APS (Comment Required: [place], Name & Number of worker spoken with), Shelter  Patient/Family's Response to care:  Pt was amenable to speaking with Education officer, museum, accepting of resources and understanding of limitations of CSW to find placement and housing.   Patient/Family's Understanding of and Emotional Response to Diagnosis, Current Treatment, and Prognosis:  Pt states understanding of  diagnosis, current treatment and prognosis. Pt continues to smoke although he does understand link between continued smoking and COPD exacerbation. Pt upset with current situation and is frustrated with not being able to be placed in more stable housing and feels as though his social history should not be used against him with finding placement. Pt open to any and all help that can be  provided.  Emotional Assessment Appearance:  Appears stated age Attitude/Demeanor/Rapport:  Gracious, Engaged Affect (typically observed):  Guarded, Frustrated, Flat Orientation:  Oriented to Self, Oriented to Place, Oriented to  Time, Oriented to Situation Alcohol / Substance use:  Tobacco Use Psych involvement (Current and /or in the community):  No (Comment)  Discharge Needs  Concerns to be addressed:  Basic Needs, Compliance Issues Concerns, Financial / Insurance Concerns, Discharge Planning Concerns, Homelessness, Lack of Support Readmission within the last 30 days:    Current discharge risk:  Lack of support system, Homeless Barriers to Discharge:  Continued Medical Work up, Unsafe home situation   [place], [place] 09/30/2017, 5:43 PM

## 2017-09-30 NOTE — Progress Notes (Signed)
PROGRESS NOTE  Taylor [name] MVE:[id number] DOB: [birth year] DOA: 09/28/2017 PCP: [facility]   LOS: 2 days   Brief Narrative / Interim history: 62 year old male, homeless currently living in a motel, with history of COPD and chronic hypoxic respiratory failure with 3 L nasal cannula at home, tobacco abuse, hyperlipidemia, anxiety who comes to the hospital with chief complaint of progressive shortness of breath.    Subjective: -Still complains of significant situational anxiety.  His breathing is good at rest but yesterday when he worked with PT he could barely make a few steps before needing to stop to catch his breath  Assessment & Plan: Principal Problem:   COPD with acute exacerbation ([place]) Active Problems:   COPD  GOLD III   Dyspepsia   Smoker   COPD exacerbation ([place])   Acute respiratory failure with hypercapnia (HCC)   Chronic respiratory failure with hypoxia and hypercapnia (HCC)   COPD exacerbation with acute on chronic hypoxic and hypercarbic respiratory failure -Continue nebulizers Pulmicort, duo nebs, steroids with prednisone as well as Levaquin -Continues to remain significantly dyspneic with ambulation -Social worker involved regarding placement issues this patient has significant failure to thrive and currently living in a motel  Tobacco abuse -Encouraged cessation, he is determined to quit  Anxiety -Continue Abilify and alprazolam  Bipolar disorder -Continue Abilify  Hyperlipidemia -Continue statin  BPH -Continue Flomax  Homelessness -Social worker consult  HLD -on statin  Scheduled Meds: . ARIPiprazole  5 mg Oral Q breakfast  . budesonide (PULMICORT) nebulizer solution  0.25 mg Nebulization BID  . enoxaparin (LOVENOX) injection  40 mg Subcutaneous Q24H  . escitalopram  10 mg Oral Daily  . folic acid  1 mg Oral Daily  . guaiFENesin  600 mg Oral BID  . insulin aspart  0-9 Units Subcutaneous TID WC  .  ipratropium-albuterol  3 mL Nebulization Once  . nicotine  21 mg Transdermal Daily  . pravastatin  20 mg Oral q1800  . predniSONE  40 mg Oral Q breakfast  . tamsulosin  0.4 mg Oral Daily  . thiamine  100 mg Oral Daily  . tiotropium  18 mcg Inhalation Daily   Continuous Infusions: . levofloxacin (LEVAQUIN) IV 100 mL/hr at 09/29/17 1900   PRN Meds:.ALPRAZolam, ipratropium-albuterol  DVT prophylaxis: Lovenox Code Status: DNR Family Communication: no family at bedside Disposition Plan: home when ready   Consultants:   None   Procedures:   none  Antimicrobials:  Levaquin 9/8 >>   Objective: Vitals:   09/30/17 0745 09/30/17 0749 09/30/17 0757 09/30/17 0826  BP:   109/81 118/80  Pulse:   75 68  Resp:   (!) 24   Temp:   (!) 97.4 F (36.3 C) 98.2 F (36.8 C)  TempSrc:   Oral Oral  SpO2: 98% 98% 99%   Weight:      Height:        Intake/Output Summary (Last 24 hours) at 09/30/2017 1010 Last data filed at 09/29/2017 2103 Gross per 24 hour  Intake 993.4 ml  Output 750 ml  Net 243.4 ml   Filed Weights   09/28/17 1612  Weight: 70.5 kg    Examination:  Constitutional: anxious, NAD Eyes: no scleral icterus ENMT: mmm Respiratory: Very few end expiratory wheezes, improved compared to yesterday, overall distant breath sounds Cardiovascular: Regular rate and rhythm, no murmurs heard.  No peripheral edema Abdomen: Soft, nontender, nondistended, positive bowel sounds Skin: No rashes seen Neurologic: Equal strength, ambulatory  Data Reviewed:  I have independently reviewed following labs and imaging studies  CBC: Recent Labs  Lab 09/26/17 1207 09/28/17 1122 09/29/17 0311  WBC 7.7 7.6 6.8  NEUTROABS 6.2 5.5  --   HGB 13.2 13.3 12.3*  HCT 42.0 42.7 38.3*  MCV 96.6 98.8 95.5  PLT 156 162 585   Basic Metabolic Panel: Recent Labs  Lab 09/26/17 1207 09/28/17 1122 09/29/17 0311  NA 143 144 140  K 4.6 3.8 4.6  CL 97* 102 97*  CO2 38* 34* 37*  GLUCOSE 246* 164*  220*  BUN 9 21 12   CREATININE 0.79 0.71 0.61  CALCIUM 8.5* 8.0* 8.1*   GFR: Estimated Creatinine Clearance: 95.5 mL/min (by C-G formula based on SCr of 0.61 mg/dL). Liver Function Tests: Recent Labs  Lab 09/26/17 1207 09/28/17 1134  AST 18 21  ALT 36 36  ALKPHOS 46 44  BILITOT 0.6 0.5  PROT 5.3* 5.3*  ALBUMIN 3.4* 3.5   No results for input(s): LIPASE, AMYLASE in the last 168 hours. No results for input(s): AMMONIA in the last 168 hours. Coagulation Profile: No results for input(s): INR, PROTIME in the last 168 hours. Cardiac Enzymes: No results for input(s): CKTOTAL, CKMB, CKMBINDEX, TROPONINI in the last 168 hours. BNP (last 3 results) No results for input(s): PROBNP in the last 8760 hours. HbA1C: Recent Labs    09/28/17 1122  HGBA1C 8.2*   CBG: Recent Labs  Lab 09/29/17 0745 09/29/17 1127 09/29/17 1554 09/29/17 2142 09/30/17 0753  GLUCAP 208* 266* 183* 121* 210*   Lipid Profile: No results for input(s): CHOL, HDL, LDLCALC, TRIG, CHOLHDL, LDLDIRECT in the last 72 hours. Thyroid Function Tests: No results for input(s): TSH, T4TOTAL, FREET4, T3FREE, THYROIDAB in the last 72 hours. Anemia Panel: No results for input(s): VITAMINB12, FOLATE, FERRITIN, TIBC, IRON, RETICCTPCT in the last 72 hours. Urine analysis:    Component Value Date/Time   COLORURINE YELLOW 06/13/2017 0203   APPEARANCEUR CLEAR 06/13/2017 0203   LABSPEC 1.018 06/13/2017 0203   PHURINE 6.0 06/13/2017 0203   GLUCOSEU 50 (A) 06/13/2017 0203   HGBUR NEGATIVE 06/13/2017 0203   BILIRUBINUR NEGATIVE 06/13/2017 0203   KETONESUR NEGATIVE 06/13/2017 0203   PROTEINUR NEGATIVE 06/13/2017 0203   UROBILINOGEN 1.0 01/09/2013 1843   NITRITE NEGATIVE 06/13/2017 0203   LEUKOCYTESUR NEGATIVE 06/13/2017 0203   Sepsis Labs: Invalid input(s): PROCALCITONIN, LACTICIDVEN  No results found for this or any previous visit (from the past 240 hour(s)).    Radiology Studies: Dg Chest Port 1 View  Result  Date: 09/28/2017 CLINICAL DATA:  Shortness of breath. EXAM: PORTABLE CHEST 1 VIEW COMPARISON:  September 26, 2017. FINDINGS: Hyperinflation of the lungs. Diaphragm flattening. The heart, hila, mediastinum, lungs, and pleura are otherwise unremarkable. IMPRESSION: Hyperinflation of the lungs suggesting COPD or emphysema. No other acute abnormalities. Electronically Signed   By: Dorise Bullion III M.D   On: 09/28/2017 11:13     Marzetta Board, MD, PhD [facility] Pager [phone]  If 7PM-7AM, please contact night-coverage [email or web address] Password [email or web address] 09/30/2017, 10:10 AM

## 2017-10-01 DIAGNOSIS — J441 Chronic obstructive pulmonary disease with (acute) exacerbation: Principal | ICD-10-CM

## 2017-10-01 LAB — BASIC METABOLIC PANEL
ANION GAP: 8 (ref 5–15)
BUN: 14 mg/dL (ref 8–23)
CO2: 32 mmol/L (ref 22–32)
Calcium: 7.8 mg/dL — ABNORMAL LOW (ref 8.9–10.3)
Chloride: 98 mmol/L (ref 98–111)
Creatinine, Ser: 0.61 mg/dL (ref 0.61–1.24)
Glucose, Bld: 225 mg/dL — ABNORMAL HIGH (ref 70–99)
Potassium: 4 mmol/L (ref 3.5–5.1)
Sodium: 138 mmol/L (ref 135–145)

## 2017-10-01 LAB — GLUCOSE, CAPILLARY
GLUCOSE-CAPILLARY: 160 mg/dL — AB (ref 70–99)
GLUCOSE-CAPILLARY: 270 mg/dL — AB (ref 70–99)
GLUCOSE-CAPILLARY: 102 mg/dL — AB (ref 70–99)
Glucose-Capillary: 108 mg/dL — ABNORMAL HIGH (ref 70–99)

## 2017-10-01 LAB — CBC
HCT: 38 % — ABNORMAL LOW (ref 39.0–52.0)
HEMOGLOBIN: 12.4 g/dL — AB (ref 13.0–17.0)
MCH: 30.5 pg (ref 26.0–34.0)
MCHC: 32.6 g/dL (ref 30.0–36.0)
MCV: 93.4 fL (ref 78.0–100.0)
Platelets: 147 10*3/uL — ABNORMAL LOW (ref 150–400)
RBC: 4.07 MIL/uL — AB (ref 4.22–5.81)
RDW: 12.4 % (ref 11.5–15.5)
WBC: 6.2 10*3/uL (ref 4.0–10.5)

## 2017-10-01 MED ORDER — LEVOFLOXACIN 750 MG PO TABS
750.0000 mg | ORAL_TABLET | Freq: Every day | ORAL | Status: DC
  Administered 2017-10-01: 750 mg via ORAL
  Filled 2017-10-01: qty 1

## 2017-10-01 MED ORDER — BUSPIRONE HCL 5 MG PO TABS
5.0000 mg | ORAL_TABLET | Freq: Three times a day (TID) | ORAL | Status: DC
  Administered 2017-10-01 – 2017-10-03 (×5): 5 mg via ORAL
  Filled 2017-10-01 (×8): qty 1

## 2017-10-01 MED ORDER — ESCITALOPRAM OXALATE 10 MG PO TABS
10.0000 mg | ORAL_TABLET | Freq: Once | ORAL | Status: AC
  Administered 2017-10-01: 10 mg via ORAL
  Filled 2017-10-01: qty 1

## 2017-10-01 MED ORDER — ESCITALOPRAM OXALATE 20 MG PO TABS
20.0000 mg | ORAL_TABLET | Freq: Every day | ORAL | Status: DC
  Administered 2017-10-02 – 2017-10-03 (×2): 20 mg via ORAL
  Filled 2017-10-01 (×2): qty 1

## 2017-10-01 NOTE — Progress Notes (Signed)
Inpatient Diabetes Program Recommendations  AACE/ADA: New Consensus Statement on Inpatient Glycemic Control (2015)  Target Ranges:  Prepandial:   less than 140 mg/dL      Peak postprandial:   less than 180 mg/dL (1-2 hours)      Critically ill patients:  140 - 180 mg/dL   Lab Results  Component Value Date   GLUCAP 160 (H) 10/01/2017   HGBA1C 8.2 (H) 09/28/2017    Review of Glycemic ControlResults for CLANTON, EMANUELSON (MRN [id number]) as of 10/01/2017 09:36  Ref. Range 09/30/2017 11:37 09/30/2017 16:20 09/30/2017 21:34 10/01/2017 08:13  Glucose-Capillary Latest Ref Range: 70 - 99 mg/dL 114 (H) 308 (H) 215 (H) 160 (H)    Diabetes history: Type 2 DM  Outpatient Diabetes medications: Metformin 500 mg bid, Prednisone 20 mg bid Current orders for Inpatient glycemic control:  Novolog sensitive tid with meals, Prednisone 40 mg daily Inpatient Diabetes Program Recommendations:   May consider adding Novolog 3 units tid with meals while on Prednisone (hold if patient eats less than 50%).   Thanks,  Adah Perl, RN, BC-ADM Inpatient Diabetes Coordinator Pager [phone] (8a-5p)

## 2017-10-01 NOTE — Progress Notes (Signed)
Physical Therapy Treatment Patient Details Name: Taylor [name] MRN: [id number] DOB: 1955-06-06 Today's Date: 10/01/2017    History of Present Illness Patient is a 62 y/o male with numerouse visits to ED in last 2 weeks admitted with primary complaints of SOB with hypoxia and anxiety. PMH significant for bipolar disorder, advanced COPD on home oxygen typically 2 L/min, depression, tobacco abuse,Colon and prostate cancer who was recently discharged from the hospital with exacerbation of COPD. Chest x-ray showed no active findings.    PT Comments    Pt continues to be limited in safe mobility by decreased strength and endurance. Pt currently requires mod I for bed mobility, and supervision for transfers and ambulation. Pt only able to ambulate approximately 75 feet at a time before requiring standing rest break due to 4/4 DoE, despite good O2 saturation on 3L O2 via [place]. D/c plans remain appropriate if feasible. PT will continue to follow acutely to progress gait endurance.      Follow Up Recommendations  Home health PT;Supervision - Intermittent     Equipment Recommendations  None recommended by PT    Recommendations for Other Services       Precautions / Restrictions Precautions Precautions: Fall Precaution Comments: watch O2 sats, reports he has never fallen Restrictions Weight Bearing Restrictions: No    Mobility  Bed Mobility Overal bed mobility: Modified Independent Bed Mobility: Supine to Sit     Supine to sit: Modified independent (Device/Increase time);HOB elevated     General bed mobility comments: increased effort use of bed rails,   Transfers Overall transfer level: Needs assistance Equipment used: None Transfers: Sit to/from Stand Sit to Stand: Supervision         General transfer comment: supervision for safety  Ambulation/Gait Ambulation/Gait assistance: Supervision Gait Distance (Feet): 250 Feet Assistive device: None Gait Pattern/deviations:  Step-through pattern;Decreased stride length;Narrow base of support;Shuffle Gait velocity: slowed Gait velocity interpretation: <1.8 ft/sec, indicate of risk for recurrent falls General Gait Details: supervision for safety, requires 2x standing rest break for 4/4 DoE         Balance Overall balance assessment: Needs assistance Sitting-balance support: Feet supported Sitting balance-Leahy Scale: Good     Standing balance support: No upper extremity supported Standing balance-Leahy Scale: Fair                              Cognition Arousal/Alertness: Awake/alert Behavior During Therapy: WFL for tasks assessed/performed Overall Cognitive Status: Within Functional Limits for tasks assessed                                           General Comments General comments (skin integrity, edema, etc.): Pt on 3L O2 via [place], at rest SaO2 96%O2 with ambulation SaO2 94%O2, however requires standing rest breaks for 4/4 DoE, max HR with ambulation 105 bpm      Pertinent Vitals/Pain Pain Assessment: No/denies pain           PT Goals (current goals can now be found in the care plan section) Acute Rehab PT Goals Patient Stated Goal: to live some place where someone could help him out when he is having trouble breathing PT Goal Formulation: With patient Time For Goal Achievement: 10/13/17 Potential to Achieve Goals: Fair    Frequency    Min 3X/week      PT  Plan Current plan remains appropriate       AM-PAC PT "6 Clicks" Daily Activity  Outcome Measure  Difficulty turning over in bed (including adjusting bedclothes, sheets and blankets)?: None Difficulty moving from lying on back to sitting on the side of the bed? : A Little Difficulty sitting down on and standing up from a chair with arms (e.g., wheelchair, bedside commode, etc,.)?: A Little Help needed moving to and from a bed to chair (including a wheelchair)?: A Little Help needed walking in  hospital room?: A Little Help needed climbing 3-5 steps with a railing? : A Lot 6 Click Score: 18    End of Session Equipment Utilized During Treatment: Gait belt;Oxygen Activity Tolerance: Patient limited by fatigue Patient left: Other (comment)(left pt at sink in room with OT) Nurse Communication: Mobility status PT Visit Diagnosis: Other abnormalities of gait and mobility (R26.89);Muscle weakness (generalized) (M62.81)     Time: 1255-1310 PT Time Calculation (min) (ACUTE ONLY): 15 min  Charges:  $Gait Training: 8-22 mins                     Taylor [name] B. Migdalia Dk PT, DPT Acute Rehabilitation Services Pager [phone] Office [phone]    [place] 10/01/2017, 1:16 PM

## 2017-10-01 NOTE — Progress Notes (Addendum)
PROGRESS NOTE  Taylor [name] FAO:[id number] DOB: September 16, 1955 DOA: 09/28/2017 PCP: [facility]   LOS: 3 days   Brief Narrative / Interim history: 62 year old male, homeless currently living in a motel, with history of COPD and chronic hypoxic respiratory failure with 3 L nasal cannula at home, tobacco abuse, hyperlipidemia, anxiety who comes to the hospital with chief complaint of progressive shortness of breath.    Subjective: Complains about severe anxiety mostly in the morning.  Mentions that he is not able to function at all due to his shortness of breath.  Later in the afternoon he reported that he is thinking about killing himself.  Assessment & Plan: Principal Problem:   COPD with acute exacerbation ([place]) Active Problems:   COPD  GOLD III   Dyspepsia   Smoker   COPD exacerbation (HCC)   Acute respiratory failure with hypercapnia (HCC)   Chronic respiratory failure with hypoxia and hypercapnia (HCC)   COPD exacerbation with acute on chronic hypoxic and hypercarbic respiratory failure -Continue nebulizers Pulmicort, duo nebs, steroids with prednisone as well as Levaquin -Continues to remain significantly dyspneic with ambulation although his oxygenation remained stable on 3 L on exertion. -Social worker involved regarding placement issues this patient has significant failure to thrive and currently living in a motel.  Please see documentation from social worker currently unable to assist on placement at the time of discharge.  Tobacco abuse -Encouraged cessation, he is determined to quit  Anxiety -Continue Abilify and discontinue alprazolam as this is not patient's long-term medication based on review of [place] controlled substance. Instead I will place the patient on BuSpar and increase his Lexapro.  Bipolar disorder Suicidal ideation -Continue Abilify Psychiatric consulted  Hyperlipidemia -Continue statin  BPH -Continue  Flomax  Homelessness -Social worker consult  HLD -on statin  Scheduled Meds: . ARIPiprazole  5 mg Oral Q breakfast  . budesonide (PULMICORT) nebulizer solution  0.25 mg Nebulization BID  . busPIRone  5 mg Oral TID  . enoxaparin (LOVENOX) injection  40 mg Subcutaneous Q24H  . [START ON 10/02/2017] escitalopram  20 mg Oral Daily  . folic acid  1 mg Oral Daily  . guaiFENesin  600 mg Oral BID  . insulin aspart  0-9 Units Subcutaneous TID WC  . ipratropium-albuterol  3 mL Nebulization Once  . levofloxacin  750 mg Oral Daily  . nicotine  21 mg Transdermal Daily  . pravastatin  20 mg Oral q1800  . predniSONE  40 mg Oral Q breakfast  . tamsulosin  0.4 mg Oral Daily  . thiamine  100 mg Oral Daily  . tiotropium  18 mcg Inhalation Daily   Continuous Infusions:  PRN Meds:.ipratropium-albuterol  DVT prophylaxis: Lovenox Code Status: DNR Family Communication: no family at bedside Disposition Plan: home when ready   Consultants:   None   Procedures:   none  Antimicrobials:  Levaquin 9/8 >>   Objective: Vitals:   10/01/17 0745 10/01/17 0812 10/01/17 1155 10/01/17 1713  BP:  118/77 133/64 125/78  Pulse:  (!) 54 79 78  Resp:  (!) 22 (!) 24 17  Temp:  97.9 F (36.6 C) 98.4 F (36.9 C) 98.8 F (37.1 C)  TempSrc:  Oral Oral Oral  SpO2: 98% 95% 96% 95%  Weight:      Height:        Intake/Output Summary (Last 24 hours) at 10/01/2017 1802 Last data filed at 10/01/2017 1040 Gross per 24 hour  Intake 360 ml  Output 1325  ml  Net -965 ml   Filed Weights   09/28/17 1612  Weight: 70.5 kg    Examination:  Constitutional: anxious, NAD Eyes: no scleral icterus ENMT: mmm Respiratory: Very few end expiratory wheezes, improved compared to yesterday, overall distant breath sounds Cardiovascular: Regular rate and rhythm, no murmurs heard.  No peripheral edema Abdomen: Soft, nontender, nondistended, positive bowel sounds Skin: No rashes seen Neurologic: Equal strength,  ambulatory  Data Reviewed: I have independently reviewed following labs and imaging studies  CBC: Recent Labs  Lab 09/26/17 1207 09/28/17 1122 09/29/17 0311 10/01/17 0302  WBC 7.7 7.6 6.8 6.2  NEUTROABS 6.2 5.5  --   --   HGB 13.2 13.3 12.3* 12.4*  HCT 42.0 42.7 38.3* 38.0*  MCV 96.6 98.8 95.5 93.4  PLT 156 162 153 856*   Basic Metabolic Panel: Recent Labs  Lab 09/26/17 1207 09/28/17 1122 09/29/17 0311 10/01/17 0302  NA 143 144 140 138  K 4.6 3.8 4.6 4.0  CL 97* 102 97* 98  CO2 38* 34* 37* 32  GLUCOSE 246* 164* 220* 225*  BUN 9 21 12 14   CREATININE 0.79 0.71 0.61 0.61  CALCIUM 8.5* 8.0* 8.1* 7.8*   GFR: Estimated Creatinine Clearance: 95.5 mL/min (by C-G formula based on SCr of 0.61 mg/dL). Liver Function Tests: Recent Labs  Lab 09/26/17 1207 09/28/17 1134  AST 18 21  ALT 36 36  ALKPHOS 46 44  BILITOT 0.6 0.5  PROT 5.3* 5.3*  ALBUMIN 3.4* 3.5   No results for input(s): LIPASE, AMYLASE in the last 168 hours. No results for input(s): AMMONIA in the last 168 hours. Coagulation Profile: No results for input(s): INR, PROTIME in the last 168 hours. Cardiac Enzymes: No results for input(s): CKTOTAL, CKMB, CKMBINDEX, TROPONINI in the last 168 hours. BNP (last 3 results) No results for input(s): PROBNP in the last 8760 hours. HbA1C: No results for input(s): HGBA1C in the last 72 hours. CBG: Recent Labs  Lab 09/30/17 1620 09/30/17 2134 10/01/17 0813 10/01/17 1205 10/01/17 1714  GLUCAP 308* 215* 160* 108* 270*   Lipid Profile: No results for input(s): CHOL, HDL, LDLCALC, TRIG, CHOLHDL, LDLDIRECT in the last 72 hours. Thyroid Function Tests: No results for input(s): TSH, T4TOTAL, FREET4, T3FREE, THYROIDAB in the last 72 hours. Anemia Panel: No results for input(s): VITAMINB12, FOLATE, FERRITIN, TIBC, IRON, RETICCTPCT in the last 72 hours. Urine analysis:    Component Value Date/Time   COLORURINE YELLOW 06/13/2017 0203   APPEARANCEUR CLEAR 06/13/2017  0203   LABSPEC 1.018 06/13/2017 0203   PHURINE 6.0 06/13/2017 0203   GLUCOSEU 50 (A) 06/13/2017 0203   HGBUR NEGATIVE 06/13/2017 0203   BILIRUBINUR NEGATIVE 06/13/2017 0203   KETONESUR NEGATIVE 06/13/2017 0203   PROTEINUR NEGATIVE 06/13/2017 0203   UROBILINOGEN 1.0 01/09/2013 1843   NITRITE NEGATIVE 06/13/2017 0203   LEUKOCYTESUR NEGATIVE 06/13/2017 0203   Sepsis Labs: Invalid input(s): PROCALCITONIN, LACTICIDVEN  No results found for this or any previous visit (from the past 240 hour(s)).    Radiology Studies: No results found.   Electronically signed: Berle Mull, MD [facility] 10/01/2017 6:04 PM  If 7PM-7AM, please contact night-coverage [email or web address] Password [facility] 10/01/2017, 6:02 PM

## 2017-10-01 NOTE — Progress Notes (Signed)
Upon discussing discharge plans with pt, he expressed suicidal thoughts. Pt stated "They better commit me somewhere or I will hurt my self. I don't want to hurt other people but my mind is racing right now and I don't trust myself. I need help." Pt denied any immediate plans of hurting himself or others.This RN immediately notified Dr. Posey Pronto via text page and in person shortly after. Charge Therapist, music also made aware. Safety sitter ordered and Suicide precautions initiated.

## 2017-10-01 NOTE — Social Work (Signed)
CSW spoke with CSW Surveyor, quantity Nathaniel Man. We discussed case and current pt needs as well as current readmission risk. Due to pt's current functional status (HHPT recommended) and registration as offender we cannot facilitate alternate placement for pt as a department. We are able to make referral back to Venia Minks at [facility] who can help pt with placement. Pt does receive disability check- which DSS may be able to help pt manage- and we are unable as previously documented to support pt with financial assistance to continue to stay at the hotel. However we will continue to follow and assist as possible with pt.   CSW will refer pt back to Venia Minks at [facility].   CSW aware pt having current suicidal thoughts and are following for disposition and other recommendations to be made.   Alexander Mt, [place] Work [phone]

## 2017-10-02 DIAGNOSIS — Z79899 Other long term (current) drug therapy: Secondary | ICD-10-CM

## 2017-10-02 DIAGNOSIS — R45851 Suicidal ideations: Secondary | ICD-10-CM

## 2017-10-02 DIAGNOSIS — F17210 Nicotine dependence, cigarettes, uncomplicated: Secondary | ICD-10-CM

## 2017-10-02 DIAGNOSIS — F4322 Adjustment disorder with anxiety: Secondary | ICD-10-CM

## 2017-10-02 LAB — GLUCOSE, CAPILLARY
GLUCOSE-CAPILLARY: 234 mg/dL — AB (ref 70–99)
GLUCOSE-CAPILLARY: 336 mg/dL — AB (ref 70–99)
Glucose-Capillary: 177 mg/dL — ABNORMAL HIGH (ref 70–99)
Glucose-Capillary: 82 mg/dL (ref 70–99)
Glucose-Capillary: 222 mg/dL — ABNORMAL HIGH (ref 70–99)

## 2017-10-02 MED ORDER — BUSPIRONE HCL 5 MG PO TABS: 5.0000 mg | ORAL_TABLET | Freq: Three times a day (TID) | ORAL | 0 refills | Status: DC

## 2017-10-02 MED ORDER — ESCITALOPRAM OXALATE 20 MG PO TABS: 20.0000 mg | ORAL_TABLET | Freq: Every day | ORAL | 0 refills | Status: DC

## 2017-10-02 MED FILL — busPIRone HCL 5 MG TABS: 5 | 5 days supply | Qty: 15 | Fill #0

## 2017-10-02 NOTE — Consult Note (Addendum)
[facility] Psychiatry Consult   Reason for Consult:  SI Referring Physician:  Dr. Posey Pronto Patient Identification: Taylor [name] MRN:  [id number] Principal Diagnosis: Adjustment disorder with anxiety Diagnosis:   Patient Active Problem List   Diagnosis Date Noted  . Sexually offensive behavior/Sex Offender (Minor male child) [R46.89] 09/23/2017  . Hypoxic Respiratory failure, acute and chronic ([place]) [J96.20] 09/22/2017  . Palliative care by specialist [Z51.5]   . DNR (do not resuscitate) [Z66]   . Chronic respiratory failure with hypoxia and hypercapnia (HCC) [J96.11, J96.12] 09/14/2017  . Acute on chronic respiratory failure with hypoxia ([place]) [J96.21] 09/11/2017  . Anxiety and depression [F41.9, F32.9]   . Prostate cancer ([place]) [C61] 09/04/2017  . Acute respiratory failure with hypercapnia ([place]) [J96.02]   . SOB (shortness of breath) [R06.02]   . Malnutrition of moderate degree [E44.0] 06/14/2017  . Hyperglycemia [R73.9] 06/08/2017  . Constipation [K59.00] 06/08/2017  . SIRS (systemic inflammatory response syndrome) (HCC) [R65.10] 05/21/2017  . Tobacco abuse [Z72.0] 05/13/2017  . HLD (hyperlipidemia) [E78.5] 05/13/2017  . Leukocytosis [D72.829] 04/06/2017  . Adjustment disorder with depressed mood [F43.21] 03/28/2017  . Adjustment disorder with mixed disturbance of emotions and conduct [F43.25]   . Normocytic normochromic anemia [D64.9] 03/24/2017  . COPD with acute exacerbation ([place]) [J44.1] 10/22/2016  . Alcohol abuse [F10.10] 01/09/2013  . COPD exacerbation ([place]) [J44.1] 01/09/2013  . Smoker [F17.200] 12/16/2012  . Inguinal hernia unilateral, non-recurrent, right [K40.90] 05/01/2012  . Dyspepsia [R10.13] 02/05/2012  . Bipolar 1 disorder ([place]) [F31.9] 02/05/2012  . 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 COPD  GOLD III [J44.9] 02/03/2012  . Thrombocytopenia ([place]) [D69.6] 02/03/2012  . Depression [F32.9]   . 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 male patient admitted with acute COPD exacerbation.  HPI:   Per chart review, patient was admitted with acute COPD exacerbation. He receives 3 L [place] O2 at home. He has significant failure to thrive. He is currently living in a motel. He has a history of bipolar disorder and anxiety. Home medications include Lexapro 10 mg daily, Abilify 5 mg daily and Xanax 0.5 mg TID PRN. PMP indicates that he only filled one short supply prescription of Xanax in January (#20). Buspar 5 mg TID was started on 9/11.  Lexapro was increased to 20 mg daily today. Xanax was discontinued yesterday. BAL and UDS was negative on admission.    Of note, patient was seen on 3/8 and 8/22 for similar presentation. He endorsed SI in the setting of homelessness and medical problems. He later denied SI once admitted to the hospital for medical treatment.   On interview, Taylor [name] reports that his mind is racing due to multiple stressors. He reports that he can no longer afford to live in the hotel where he was staying. He was previously living in a boarding house but it was shut down. He reports that he has suicidal thoughts but denies a plan or intent to harm himself. He reports having these thoughts due to his current situation. He denies a history of suicide attempts. He reports that he would like to go to a psychiatric hospital to be "committed for long term care" so that he may have a safe place to be. We spoke in detail about his need for a stable living environment and how an inpatient psychiatric hospitalization would not be the answer to his  problem because he would still need to work on stable housing. He demonstrated understanding and reported that he would think of ways to find a place to live. He is willing to speak to SW for any assistance with housing. He denies HI or AVH. He reports a fair appetite and poor sleep  due to generalized worries about housing.   Past Psychiatric History: MDD and BPAD  Risk to Self:  He endorses situational SI without a plan or intention to harm self.  Risk to Others:  None. Denies HI.  Prior Inpatient Therapy:  Denies  Prior Outpatient Therapy:  He was previously followed at [facility].   Past Medical History:  Past Medical History:  Diagnosis Date  . Asthma   . Bipolar 1 disorder ([place]) 02/05/2012  . Colon cancer ([place]) 04/11/11   adenocarcinoma of colon, 7/19 nodes pos.FINISHED CHEMO/DR. SHERRILL  . COPD (chronic obstructive pulmonary disease) ([place])    SMOKER  . Depression   . Emphysema of lung ([place])   . Full dentures   . Hemorrhoids   . On home oxygen therapy    "3L; 24/7" (05/29/2017)  . Pneumonia ~ 2016   "double pneumonia"  . Prostate cancer ([place])    Gleason score = 7, supposed to have radiation therapy but he has not followed up (05/29/2017)  . Rib fractures    hx of    Past Surgical History:  Procedure Laterality Date  . COLON SURGERY  04/11/11   Sigmoid colectomy  . COLOSTOMY REVISION  04/11/2011   Procedure: COLON RESECTION SIGMOID;  Surgeon: [name], MD;  Location: [facility];  Service: General;  Laterality: N/A;  low anterior colon resection   . HERNIA REPAIR    . INGUINAL HERNIA REPAIR Right 05/01/2012   Procedure: HERNIA REPAIR INGUINAL ADULT;  Surgeon: [name], MD;  Location: [facility];  Service: General;  Laterality: Right;  . INSERTION OF MESH Right 05/01/2012   Procedure: INSERTION OF MESH;  Surgeon: [name], MD;  Location: [facility];  Service: General;  Laterality: Right;  . PORT-A-CATH REMOVAL Left 05/01/2012   Procedure: REMOVAL Infusion Port;  Surgeon: [name], MD;  Location: [facility];  Service: General;  Laterality: Left;  . PORTACATH PLACEMENT  05/02/2011   Procedure: INSERTION PORT-A-CATH;  Surgeon: [name], MD;  Location: [facility];  Service: General;  Laterality: N/A;  . PROSTATE BIOPSY     Family History:  Family History   Problem Relation Age of Onset  . Heart disease Father   . Lung cancer Maternal Uncle        smoked   Family Psychiatric  History: Brother-depression.  Social History:  Social History   Substance and Sexual Activity  Alcohol Use Not Currently   Comment: h/o use in the past, no h/o heavy use     Social History   Substance and Sexual Activity  Drug Use No    Social History   Socioeconomic History  . Marital status: Divorced    Spouse name: Not on file  . Number of children: 1  . Years of education: Not on file  . Highest education level: Not on file  Occupational History  . Occupation: disability pending  Social Needs  . Financial resource strain: Not hard at all  . Food insecurity:    Worry: Never true    Inability: Never true  . Transportation needs:    Medical: Yes    Non-medical: Yes  Tobacco Use  .  Smoking status: Former Smoker    Packs/day: 0.10    Years: 46.00    Pack years: 4.60    Types: Cigarettes    Last attempt to quit: 05/21/2017    Years since quitting: 0.3  . Smokeless tobacco: Former Systems developer    Types: Chew  Substance and Sexual Activity  . Alcohol use: Not Currently    Comment: h/o use in the past, no h/o heavy use  . Drug use: No  . Sexual activity: Not Currently  Lifestyle  . Physical activity:    Days per week: 0 days    Minutes per session: 0 min  . Stress: Very much  Relationships  . Social connections:    Talks on phone: More than three times a week    Gets together: Never    Attends religious service: Never    Active member of club or organization: No    Attends meetings of clubs or organizations: Never    Relationship status: Divorced  Other Topics Concern  . Not on file  Social History Narrative   Lives alone-divorced, disabled, in [place] 1970's   Brother, Jaisen Wiltrout and his wife Jackelyn Poling assist   Prior occupation: Public relations account executive Social History: He previously lived in a boarding house. He denies stable housing for 4  years since release from jail. He was incarcerated for 2.5 years. He is a sex offender. He is divorced. He has a 48 y/o son son for which he is estranged. He has been unemployed for the past 10 years. He used to do Dealer work. He denies alcohol or illicit substance use. He reports heavy alcohol use 4 years ago but denies a history of withdrawal. He reports last using crack cocaine a month ago. He previously smoked 1 ppd for several years.     Allergies:   Allergies  Allergen Reactions  . Codeine Nausea And Vomiting    Labs:  Results for orders placed or performed during the hospital encounter of 09/28/17 (from the past 48 hour(s))  Glucose, capillary     Status: Abnormal   Collection Time: 09/30/17  4:20 PM  Result Value Ref Range   Glucose-Capillary 308 (H) 70 - 99 mg/dL  Glucose, capillary     Status: Abnormal   Collection Time: 09/30/17  9:34 PM  Result Value Ref Range   Glucose-Capillary 215 (H) 70 - 99 mg/dL  Basic metabolic panel     Status: Abnormal   Collection Time: 10/01/17  3:02 AM  Result Value Ref Range   Sodium 138 135 - 145 mmol/L   Potassium 4.0 3.5 - 5.1 mmol/L   Chloride 98 98 - 111 mmol/L   CO2 32 22 - 32 mmol/L   Glucose, Bld 225 (H) 70 - 99 mg/dL   BUN 14 8 - 23 mg/dL   Creatinine, Ser 0.61 0.61 - 1.24 mg/dL   Calcium 7.8 (L) 8.9 - 10.3 mg/dL   GFR calc non Af Amer >60 >60 mL/min   GFR calc Af Amer >60 >60 mL/min    Comment: (NOTE) The eGFR has been calculated using the CKD EPI equation. This calculation has not been validated in all clinical situations. eGFR's persistently <60 mL/min signify possible Chronic Kidney Disease.    Anion gap 8 5 - 15    Comment: Performed at [facility] [address], [place], [place] [address]  CBC     Status: Abnormal   Collection Time: 10/01/17  3:02 AM  Result Value Ref  Range   WBC 6.2 4.0 - 10.5 K/uL   RBC 4.07 (L) 4.22 - 5.81 MIL/uL   Hemoglobin 12.4 (L) 13.0 - 17.0 g/dL   HCT 38.0 (L) 39.0 - 52.0 %    MCV 93.4 78.0 - 100.0 fL   MCH 30.5 26.0 - 34.0 pg   MCHC 32.6 30.0 - 36.0 g/dL   RDW 12.4 11.5 - 15.5 %   Platelets 147 (L) 150 - 400 K/uL    Comment: Performed at [facility] [address], [place], [place] [address]  Glucose, capillary     Status: Abnormal   Collection Time: 10/01/17  8:13 AM  Result Value Ref Range   Glucose-Capillary 160 (H) 70 - 99 mg/dL  Glucose, capillary     Status: Abnormal   Collection Time: 10/01/17 12:05 PM  Result Value Ref Range   Glucose-Capillary 108 (H) 70 - 99 mg/dL  Glucose, capillary     Status: Abnormal   Collection Time: 10/01/17  5:14 PM  Result Value Ref Range   Glucose-Capillary 270 (H) 70 - 99 mg/dL  Glucose, capillary     Status: Abnormal   Collection Time: 10/01/17 10:11 PM  Result Value Ref Range   Glucose-Capillary 102 (H) 70 - 99 mg/dL  Glucose, capillary     Status: Abnormal   Collection Time: 10/02/17  7:09 AM  Result Value Ref Range   Glucose-Capillary 177 (H) 70 - 99 mg/dL  Glucose, capillary     Status: Abnormal   Collection Time: 10/02/17  8:56 AM  Result Value Ref Range   Glucose-Capillary 234 (H) 70 - 99 mg/dL  Glucose, capillary     Status: None   Collection Time: 10/02/17 11:30 AM  Result Value Ref Range   Glucose-Capillary 82 70 - 99 mg/dL    Current Facility-Administered Medications  Medication Dose Route Frequency Provider Last Rate Last Dose  . ARIPiprazole (ABILIFY) tablet 5 mg  5 mg Oral Q breakfast Cristal Deer, MD   5 mg at 10/02/17 0918  . budesonide (PULMICORT) nebulizer solution 0.25 mg  0.25 mg Nebulization BID Caren Griffins, MD   0.25 mg at 10/02/17 0731  . busPIRone (BUSPAR) tablet 5 mg  5 mg Oral TID Lavina Hamman, MD   5 mg at 10/02/17 0942  . enoxaparin (LOVENOX) injection 40 mg  40 mg Subcutaneous Q24H Cristal Deer, MD   40 mg at 10/01/17 1747  . escitalopram (LEXAPRO) tablet 20 mg  20 mg Oral Daily Lavina Hamman, MD   20 mg at 10/02/17 0943  . folic acid (FOLVITE)  tablet 1 mg  1 mg Oral Daily Cristal Deer, MD   1 mg at 10/02/17 0943  . guaiFENesin (MUCINEX) 12 hr tablet 600 mg  600 mg Oral BID Cristal Deer, MD   600 mg at 10/02/17 0944  . insulin aspart (novoLOG) injection 0-9 Units  0-9 Units Subcutaneous TID WC Cristal Deer, MD   3 Units at 10/02/17 [phone]  . ipratropium-albuterol (DUONEB) 0.5-2.5 (3) MG/3ML nebulizer solution 3 mL  3 mL Nebulization Once Cristal Deer, MD      . ipratropium-albuterol (DUONEB) 0.5-2.5 (3) MG/3ML nebulizer solution 3 mL  3 mL Nebulization Q4H PRN Caren Griffins, MD      . levofloxacin (LEVAQUIN) tablet 750 mg  750 mg Oral Daily Lavina Hamman, MD   750 mg at 10/01/17 1746  . nicotine (NICODERM CQ - dosed in mg/24 hours) patch 21 mg  21 mg Transdermal Daily  Cristal Deer, MD   21 mg at 10/02/17 0945  . pravastatin (PRAVACHOL) tablet 20 mg  20 mg Oral q1800 Cristal Deer, MD   20 mg at 10/01/17 1746  . tamsulosin (FLOMAX) capsule 0.4 mg  0.4 mg Oral Daily Cristal Deer, MD   0.4 mg at 10/02/17 0949  . thiamine (VITAMIN B-1) tablet 100 mg  100 mg Oral Daily Cristal Deer, MD   100 mg at 10/02/17 0949  . tiotropium (SPIRIVA) inhalation capsule 18 mcg  18 mcg Inhalation Daily Cristal Deer, MD   18 mcg at 10/02/17 0731    Musculoskeletal: Strength & Muscle Tone: within normal limits Gait & Station: UTA since patient was lying in bed. Patient leans: N/A  Psychiatric Specialty Exam: Physical Exam  Nursing note and vitals reviewed. Constitutional: He is oriented to person, place, and time. He appears well-developed and well-nourished.  HENT:  Head: Normocephalic and atraumatic.  Neck: Normal range of motion.  Respiratory: Effort normal.  Musculoskeletal: Normal range of motion.  Neurological: He is alert and oriented to person, place, and time.  Psychiatric: He has a normal mood and affect. His behavior is normal. Judgment and thought content normal. Cognition and memory are normal.     Review of Systems  Constitutional: Negative for chills and fever.  Cardiovascular: Negative for chest pain.  Gastrointestinal: Positive for abdominal pain, diarrhea and heartburn. Negative for constipation, nausea and vomiting.  Psychiatric/Behavioral: Positive for depression and suicidal ideas. Negative for hallucinations and substance abuse. The patient is nervous/anxious and has insomnia.   All other systems reviewed and are negative.   Blood pressure 122/84, pulse 81, temperature 98.5 F (36.9 C), temperature source Oral, resp. rate 20, height _0  (1.803 m), weight 70.5 kg, SpO2 96 %.Body mass index is 21.68 kg/m.  General Appearance: Fairly Groomed, middle aged, Caucasian male, wearing a hospital gown with an unshaved face who is lying in bed. NAD.  Eye Contact:  Good  Speech:  Clear and Coherent and Normal Rate  Volume:  Normal  Mood:  Anxious and Depressed  Affect:  Constricted  Thought Process:  Goal Directed, Linear and Descriptions of Associations: Intact  Orientation:  Full (Time, Place, and Person)  Thought Content:  Logical  Suicidal Thoughts:  Yes.  without intent/plan  Homicidal Thoughts:  No  Memory:  Immediate;   Good Recent;   Good Remote;   Good  Judgement:  Fair  Insight:  Fair  Psychomotor Activity:  Normal  Concentration:  Concentration: Good and Attention Span: Good  Recall:  Good  Fund of Knowledge:  Good  Language:  NA  Akathisia:  No  Handed:  Right  AIMS (if indicated):   N/A  Assets:  Communication Skills Desire for Improvement  ADL's:  Intact  Cognition:  WNL  Sleep:   Poor   Assessment:  Taylor [name] is a 62 y.o. male who was admitted with acute COPD exacerbation. He endorsed SI so psychiatry was consulted.  He reports situational SI due to unstable housing. He denies any plan or intention to harm self. He denies a history of suicide attempts. He has a history of endorsing SI, which resolves once housing is arranged for him. He has  not demonstrated any unsafe behavior since hospitalization. There is no evidence of a psychiatric condition which is amendable to inpatient psychiatric hospitalization. Patient's SI appears to be conditional and is a means of manipulation to obtain shelter without a true intention to harm self. His behavior is more  consistent with someone who is acting in self-preservation, rather than someone who is acutely suicidal. His overall imminent risk of harm to self is low. He is help seeking and will speak to SW about his housing concerns for possible assistance as well as take the necessary steps to look for shelter on his own as well.     Treatment Plan Summary: -Continue Buspar 5 mg TID for mood augmentation and anxiety. -Continue Lexapro 20 mg daily for depression and anxiety. -Continue Abilify 5 mg daily for mood stabilization.  -EKG reviewed and QTc 415 on 9/8. Please closely monitor when starting or increasing QTc prolonging agents.  -SW to provide any assistance with stable housing.  -Psychiatry will sign off on patient at this time. Please consult psychiatry again as needed.   Disposition: No evidence of imminent risk to self or others at present.   Patient does not meet criteria for psychiatric inpatient admission.  Faythe Dingwall, DO 10/02/2017 12:35 PM

## 2017-10-02 NOTE — Social Work (Addendum)
Referral called back to Venia Minks at [facility]  Phone # [phone]. Have left HIPPA compliant message. Await return call and psychiatry disposition. Per note yesterday CSW unable to place pt at current functional status and with pt's status as an offender. This was reviewed with social work Surveyor, quantity. Will continue to follow to await confirmation of referral to DSS.  4:24pm- CSW has given bus passes to bedside RN, pt responsible for discharge planning due to current restrictions. Still no return call from DSS. RN Case Manager aware.  Alexander Mt, [place] Work [phone]

## 2017-10-02 NOTE — Care Management Note (Addendum)
Case Management Note  Patient Details  Name: Taylor [name] MRN: [id number] Date of Birth: 03/08/1955  Subjective/Objective:    From hotel, presents with copd ex, anxiety,  NCM spoke with patient, he stays in the [place] hotel now, he has oxygen concentrator and neb machine with [employer].  He states his Case worker is suppose to be finding him some place to live and he is not sure where she is in this process.  The Group home he was previously in was closed down.   NCM contacted [name] to see if she could help assist with this thru DSS, she is contacting patient's case worker at [facility]. Per pt eval rec HHPT/ HHOT, he chose [facility] from agency list.  Referral given to [facility] with [facility] for [place], [place], [place].  Patient has not been to the [facility] urgent care in 4 years, he does not really have a MD there.  He has been to the [facility] , NCM will get him a follow up apt there, his apt is 10/7 at 1:50 at the [facility]. Will need address where patient is going to after discharge in order to receive [facility] services.   Will need [place] orders prior to dc     9/12 Ajna Moors RN, BSN- per [name] with [facility], she is taking patient off of her referral list because we have no address of where patient is going. He will need to be referred again once we figure out address of where he is going at dc.  Per [name],  Patient is responsible for his own discharge, he has been cleared by psych per MD.  NCM contacted [name] at the [facility] to see if patient could come back, he states if he pays the 400 dollars he can come back and have a room.  This information was given to patient. Patient states he will call his brother to see if he can get the money from him.  NCM informed MD of the information given by [name].  CSW will bring patient bus pass for transportation.  Patient states his friend has his oxygen and will be bringing his oxygen when he picks him up, he has oxygen thru [employer]  per patient.  This information was given to [name] and MD and and Agricultural consultant, CSW also gave patient 2 bus passes.                    Action/Plan: NCM will follow for transition of care needs.   Expected Discharge Date:                  Expected Discharge Plan:  Home/Self Care  In-House Referral:  Clinical Social Work  Discharge planning Services  CM Consult  Post Acute Care Choice:  Home Health Choice offered to:  Patient  DME Arranged:    DME Agency:     HH Arranged:    [place] Agency:     Status of Service:  In process, will continue to follow  If discussed at Long Length of Stay Meetings, dates discussed:    Additional Comments:  [name], RN 10/02/2017, 3:11 PM

## 2017-10-02 NOTE — Progress Notes (Signed)
PROGRESS NOTE  Taylor [name] DOB: [birth year] DOA: 09/28/2017 PCP: [facility]   LOS: 4 days   Brief Narrative / Interim history: 62 year old male, homeless currently living in a motel, with history of COPD and chronic hypoxic respiratory failure with 3 L nasal cannula at home, tobacco abuse, hyperlipidemia, anxiety who comes to the hospital with chief complaint of progressive shortness of breath.    Subjective: Denies any acute complaint.  On further questioning regarding whether he is having suicidal ideation or not he is not clear.  No other acute concerns reported by the patient but this morning.  Assessment & Plan: Principal Problem:   COPD with acute exacerbation ([place]) Active Problems:   COPD  GOLD III   Dyspepsia   Smoker   COPD exacerbation (HCC)   Acute respiratory failure with hypercapnia (HCC)   Chronic respiratory failure with hypoxia and hypercapnia (HCC)   COPD exacerbation with acute on chronic hypoxic and hypercarbic respiratory failure -Continue nebulizers Pulmicort, duo nebs, steroids with prednisone as well as Levaquin -Continues to remain significantly dyspneic with ambulation although his oxygenation remained stable on 3 L on exertion. -Social worker involved regarding placement issues this patient has significant failure to thrive and currently living in a motel.  Please see documentation from social worker currently unable to assist on placement at the time of discharge.  Tobacco abuse -Encouraged cessation, he is determined to quit  Anxiety -Continue Abilify and discontinue alprazolam as this is not patient's long-term medication based on review of [place] controlled substance. Instead I will place the patient on BuSpar and increase his Lexapro.  Bipolar disorder Suicidal ideation -Continue Abilify Psychiatric consulted, appreciate their assistance. Patient is cleared from psychiatric perspective to be discharged also  to the hospital without any inpatient psychiatric hospitalization.  Appreciate their input.  Hyperlipidemia -Continue statin  BPH -Continue Flomax  Homelessness -Social worker consult case manager is currently consulting with the director to see whether there is a possibility of getting patient placed somewhere.  Will hold him tonight to arrange for safe discharge.  HLD -on statin  Scheduled Meds: . ARIPiprazole  5 mg Oral Q breakfast  . budesonide (PULMICORT) nebulizer solution  0.25 mg Nebulization BID  . busPIRone  5 mg Oral TID  . enoxaparin (LOVENOX) injection  40 mg Subcutaneous Q24H  . escitalopram  20 mg Oral Daily  . folic acid  1 mg Oral Daily  . guaiFENesin  600 mg Oral BID  . insulin aspart  0-9 Units Subcutaneous TID WC  . nicotine  21 mg Transdermal Daily  . pravastatin  20 mg Oral q1800  . tamsulosin  0.4 mg Oral Daily  . thiamine  100 mg Oral Daily  . tiotropium  18 mcg Inhalation Daily   Continuous Infusions:  PRN Meds:.ipratropium-albuterol  DVT prophylaxis: Lovenox Code Status: DNR Family Communication: no family at bedside Disposition Plan: home when ready   Consultants:   None   Procedures:   none  Antimicrobials:  Levaquin 9/8 >>   Objective: Vitals:   10/02/17 0728 10/02/17 0733 10/02/17 0734 10/02/17 1135  BP: (!) 131/96   122/84  Pulse: 74   81  Resp: 20   20  Temp: 98.4 F (36.9 C)   98.5 F (36.9 C)  TempSrc: Oral   Oral  SpO2: 97% 97% 97% 96%  Weight:      Height:        Intake/Output Summary (Last 24 hours) at 10/02/2017 1642 Last  data filed at 10/02/2017 1226 Gross per 24 hour  Intake 600 ml  Output 1015 ml  Net -415 ml   Filed Weights   09/28/17 1612  Weight: 70.5 kg    Examination:  Constitutional: anxious, NAD Eyes: no scleral icterus ENMT: mmm Respiratory: Very diminished breath sounds.  No expiratory wheezes.  No crackles. Cardiovascular: Regular rate and rhythm, no murmurs heard.  No peripheral  edema Abdomen: Soft, nontender, nondistended, positive bowel sounds Skin: No rashes seen Neurologic: Equal strength, ambulatory  Data Reviewed: I have independently reviewed following labs and imaging studies  CBC: Recent Labs  Lab 09/26/17 1207 09/28/17 1122 09/29/17 0311 10/01/17 0302  WBC 7.7 7.6 6.8 6.2  NEUTROABS 6.2 5.5  --   --   HGB 13.2 13.3 12.3* 12.4*  HCT 42.0 42.7 38.3* 38.0*  MCV 96.6 98.8 95.5 93.4  PLT 156 162 153 121*   Basic Metabolic Panel: Recent Labs  Lab 09/26/17 1207 09/28/17 1122 09/29/17 0311 10/01/17 0302  NA 143 144 140 138  K 4.6 3.8 4.6 4.0  CL 97* 102 97* 98  CO2 38* 34* 37* 32  GLUCOSE 246* 164* 220* 225*  BUN 9 21 12 14   CREATININE 0.79 0.71 0.61 0.61  CALCIUM 8.5* 8.0* 8.1* 7.8*   GFR: Estimated Creatinine Clearance: 95.5 mL/min (by C-G formula based on SCr of 0.61 mg/dL). Liver Function Tests: Recent Labs  Lab 09/26/17 1207 09/28/17 1134  AST 18 21  ALT 36 36  ALKPHOS 46 44  BILITOT 0.6 0.5  PROT 5.3* 5.3*  ALBUMIN 3.4* 3.5   No results for input(s): LIPASE, AMYLASE in the last 168 hours. No results for input(s): AMMONIA in the last 168 hours. Coagulation Profile: No results for input(s): INR, PROTIME in the last 168 hours. Cardiac Enzymes: No results for input(s): CKTOTAL, CKMB, CKMBINDEX, TROPONINI in the last 168 hours. BNP (last 3 results) No results for input(s): PROBNP in the last 8760 hours. HbA1C: No results for input(s): HGBA1C in the last 72 hours. CBG: Recent Labs  Lab 10/01/17 1714 10/01/17 2211 10/02/17 0709 10/02/17 0856 10/02/17 1130  GLUCAP 270* 102* 177* 234* 82   Lipid Profile: No results for input(s): CHOL, HDL, LDLCALC, TRIG, CHOLHDL, LDLDIRECT in the last 72 hours. Thyroid Function Tests: No results for input(s): TSH, T4TOTAL, FREET4, T3FREE, THYROIDAB in the last 72 hours. Anemia Panel: No results for input(s): VITAMINB12, FOLATE, FERRITIN, TIBC, IRON, RETICCTPCT in the last 72  hours. Urine analysis:    Component Value Date/Time   COLORURINE YELLOW 06/13/2017 0203   APPEARANCEUR CLEAR 06/13/2017 0203   LABSPEC 1.018 06/13/2017 0203   PHURINE 6.0 06/13/2017 0203   GLUCOSEU 50 (A) 06/13/2017 0203   HGBUR NEGATIVE 06/13/2017 0203   BILIRUBINUR NEGATIVE 06/13/2017 0203   KETONESUR NEGATIVE 06/13/2017 0203   PROTEINUR NEGATIVE 06/13/2017 0203   UROBILINOGEN 1.0 01/09/2013 1843   NITRITE NEGATIVE 06/13/2017 0203   LEUKOCYTESUR NEGATIVE 06/13/2017 0203   Sepsis Labs: Invalid input(s): PROCALCITONIN, LACTICIDVEN  No results found for this or any previous visit (from the past 240 hour(s)).    Radiology Studies: No results found.   Electronically signed: Berle Mull, MD [facility] 10/02/2017 4:42 PM  If 7PM-7AM, please contact night-coverage [email or web address] Password [facility] 10/02/2017, 4:42 PM

## 2017-10-02 NOTE — Progress Notes (Addendum)
Occupational Therapy Treatment Patient Details Name: Taylor [name] MRN: [id number] DOB: October 22, 1955 Today's Date: 10/02/2017    History of present illness Patient is a 62 y/o male with numerouse visits to ED in last 2 weeks admitted with primary complaints of SOB with hypoxia and anxiety. PMH significant for bipolar disorder, advanced COPD on home oxygen typically 2 L/min, depression, tobacco abuse,Colon and prostate cancer who was recently discharged from the hospital with exacerbation of COPD. Chest x-ray showed no active findings.   OT comments  Pt progressing towards acute OT goals. Focus of session was toilet transfer and grooming tasks at sink. Pt agreeable to work with therapy, appears anxious. Seeking external support during mobilization, asking to sit at sink for grooming tasks. HR noted to be 120 during session. D/c plan remains appropriate.    Follow Up Recommendations  Supervision - Intermittent;Home health OT    Equipment Recommendations  Tub/shower seat    Recommendations for Other Services      Precautions / Restrictions Precautions Precautions: Fall Precaution Comments: watch O2 sats, reports he has never fallen Restrictions Weight Bearing Restrictions: No Other Position/Activity Restrictions: WBAT       Mobility Bed Mobility Overal bed mobility: Modified Independent Bed Mobility: Supine to Sit     Supine to sit: Modified independent (Device/Increase time);HOB elevated        Transfers Overall transfer level: Needs assistance Equipment used: None Transfers: Sit to/from Stand Sit to Stand: Supervision         General transfer comment: supervision for safety    Balance Overall balance assessment: Needs assistance Sitting-balance support: Feet supported Sitting balance-Leahy Scale: Good     Standing balance support: No upper extremity supported Standing balance-Leahy Scale: Fair                             ADL either  performed or assessed with clinical judgement   ADL Overall ADL's : Needs assistance/impaired     Grooming: Wash/dry face;Wash/dry hands;Brushing hair;Set up;Supervision/safety;Sitting                   Toilet Transfer: Min guard;Ambulation           Functional mobility during ADLs: Min guard General ADL Comments: Pt completed in room functional mobility, toilet transfer, and grooming tasks seated at sink.      Vision       Perception     Praxis      Cognition Arousal/Alertness: Awake/alert Behavior During Therapy: WFL for tasks assessed/performed Overall Cognitive Status: Within Functional Limits for tasks assessed                                          Exercises     Shoulder Instructions       General Comments HR 120 seated at sink after after exiting bathroom    Pertinent Vitals/ Pain       Pain Assessment: Faces Faces Pain Scale: Hurts little more Pain Location: headache Pain Descriptors / Indicators: [place]                                          Prior Functioning/Environment  Frequency  Min 2X/week        Progress Toward Goals  OT Goals(current goals can now be found in the care plan section)  Progress towards OT goals: Progressing toward goals  Acute Rehab OT Goals Patient Stated Goal: to live some place where someone could help him out when he is having trouble breathing OT Goal Formulation: With patient Time For Goal Achievement: 10/13/17 Potential to Achieve Goals: Good ADL Goals Pt Will Perform Lower Body Bathing: with modified independence;sit to/from stand Pt Will Perform Lower Body Dressing: with modified independence;sit to/from stand Pt Will Transfer to Toilet: with modified independence;ambulating Pt Will Perform Toileting - Clothing Manipulation and hygiene: with modified independence;sit to/from stand Additional ADL Goal #1: Pt will indepenently  verbalize 3 energy conservation strategies for daily ADL tasks Additional ADL Goal #2: Pt will complete 3 consecutive grooming tasks standing at sink without rest breaks in order to increase activity tolerance.  Plan Discharge plan remains appropriate    Co-evaluation                 AM-PAC PT "6 Clicks" Daily Activity     Outcome Measure   Help from another person eating meals?: None Help from another person taking care of personal grooming?: None Help from another person toileting, which includes using toliet, bedpan, or urinal?: None Help from another person bathing (including washing, rinsing, drying)?: A Little Help from another person to put on and taking off regular upper body clothing?: None Help from another person to put on and taking off regular lower body clothing?: A Little 6 Click Score: 22    End of Session Equipment Utilized During Treatment: Oxygen  OT Visit Diagnosis: Unsteadiness on feet (R26.81);Muscle weakness (generalized) (M62.81);Other (comment)   Activity Tolerance Patient tolerated treatment well   Patient Left in chair;with call bell/phone within reach; with sitter in room   Nurse Communication Mobility status        Time: 4481-8563 OT Time Calculation (min): 12 min  Charges: OT General Charges $OT Visit: 1 Visit OT Treatments $Self Care/Home Management : 8-22 mins  Tyrone Schimke, [place] Pager: [phone] Office: [phone]     10/02/2017, 1:55 PM

## 2017-10-02 NOTE — Progress Notes (Signed)
Inpatient Diabetes Program Recommendations  AACE/ADA: New Consensus Statement on Inpatient Glycemic Control (2015)  Target Ranges:  Prepandial:   less than 140 mg/dL      Peak postprandial:   less than 180 mg/dL (1-2 hours)      Critically ill patients:  140 - 180 mg/dL   Lab Results  Component Value Date   GLUCAP 234 (H) 10/02/2017   HGBA1C 8.2 (H) 09/28/2017    Review of Glycemic Control Results for Taylor [name], Taylor [name] (MRN [id number]) as of 10/02/2017 11:20  Ref. Range 10/01/2017 17:14 10/01/2017 22:11 10/02/2017 07:09 10/02/2017 08:56  Glucose-Capillary Latest Ref Range: 70 - 99 mg/dL 270 (H) 102 (H) 177 (H) 234 (H)   Diabetes history: Type 2 DM  Outpatient Diabetes medications: Metformin 500 mg bid, Prednisone 20 mg bid Current orders for Inpatient glycemic control:  Novolog sensitive tid with meals, Prednisone 40 mg daily  Inpatient Diabetes Program Recommendations:    May consider adding Novolog 3 units tid with meals while on Prednisone (hold if patient eats less than 50%). Noted Prednisone's last dose is scheduled for today, however, anticipate postprandials to remain >180 mg/dL.   Thanks, Bronson Curb, MSN, RNC-OB Diabetes Coordinator [phone] (8a-5p)

## 2017-10-03 DIAGNOSIS — F4322 Adjustment disorder with anxiety: Secondary | ICD-10-CM

## 2017-10-03 LAB — GLUCOSE, CAPILLARY
GLUCOSE-CAPILLARY: 136 mg/dL — AB (ref 70–99)
GLUCOSE-CAPILLARY: 89 mg/dL (ref 70–99)
Glucose-Capillary: 120 mg/dL — ABNORMAL HIGH (ref 70–99)

## 2017-10-03 NOTE — Progress Notes (Signed)
Occupational Therapy Treatment Patient Details Name: Taylor [name] MRN: [id number] DOB: 1955-11-23 Today's Date: 10/03/2017    History of present illness Patient is a 62 y/o male with numerouse visits to ED in last 2 weeks admitted with primary complaints of SOB with hypoxia and anxiety. PMH significant for bipolar disorder, advanced COPD on home oxygen typically 2 L/min, depression, tobacco abuse,Colon and prostate cancer who was recently discharged from the hospital with exacerbation of COPD. Chest x-ray showed no active findings.   OT comments  Pt. Provided A/E for safety and energy conservation during LB ADLs.  Pt. Able to return demo of use.    Follow Up Recommendations  Supervision - Intermittent;Home health OT    Equipment Recommendations  Tub/shower seat    Recommendations for Other Services      Precautions / Restrictions Precautions Precautions: Fall Precaution Comments: watch O2 sats, reports he has never fallen       Mobility Bed Mobility                  Transfers                      Balance                                           ADL either performed or assessed with clinical judgement   ADL Overall ADL's : Needs assistance/impaired             Lower Body Bathing: Cueing for compensatory techniques;Cueing for sequencing;With adaptive equipment Lower Body Bathing Details (indicate cue type and reason): reviewed and showed LH sponge for LB ADLs     Lower Body Dressing: Set up;Sitting/lateral leans;Cueing for compensatory techniques;Cueing for sequencing;With adaptive equipment Lower Body Dressing Details (indicate cue type and reason): able to return demo of use of sock aide and reacher for LB dressing               General ADL Comments: provided, demonstrated and had pt. return demo of use of A/E for LB adls. reviewed benefits for use for energy conservation and safety.      Vision        Perception     Praxis      Cognition Arousal/Alertness: Awake/alert Behavior During Therapy: WFL for tasks assessed/performed Overall Cognitive Status: Within Functional Limits for tasks assessed                                          Exercises     Shoulder Instructions       General Comments      Pertinent Vitals/ Pain       Pain Assessment: No/denies pain  Home Living                                          Prior Functioning/Environment              Frequency  Min 2X/week        Progress Toward Goals  OT Goals(current goals can now be found in the care plan section)  Progress towards OT goals: Progressing toward goals     Plan Discharge  plan remains appropriate    Co-evaluation                 AM-PAC PT "6 Clicks" Daily Activity     Outcome Measure   Help from another person eating meals?: None Help from another person taking care of personal grooming?: None Help from another person toileting, which includes using toliet, bedpan, or urinal?: None Help from another person bathing (including washing, rinsing, drying)?: A Little Help from another person to put on and taking off regular upper body clothing?: None Help from another person to put on and taking off regular lower body clothing?: A Little 6 Click Score: 22    End of Session Equipment Utilized During Treatment: Oxygen;Other (comment)(a/e)  OT Visit Diagnosis: Unsteadiness on feet (R26.81);Muscle weakness (generalized) (M62.81);Other (comment)   Activity Tolerance     Patient Left in bed;with call bell/phone within reach   Nurse Communication Other (comment)(informed rn i was providing a/e kit for pt. to be left in the room with him)        Time: 1352-1400 OT Time Calculation (min): 8 min  Charges: OT General Charges $OT Visit: 1 Visit OT Treatments $Self Care/Home Management : 8-22 mins   Janice Coffin,  COTA/L 10/03/2017, 2:09 PM

## 2017-10-03 NOTE — Care Management Note (Addendum)
Case Management Note  Patient Details  Name: Taylor [name] MRN: [id number] Date of Birth: October 28, 1955  Subjective/Objective:    From hotel, presents with copd ex, anxiety,  NCM spoke with patient, he stays in the [place] hotel now, he has oxygen concentrator and neb machine with [employer].  He states his Case worker is suppose to be finding him some place to live and he is not sure where she is in this process.  The Group home he was previously in was closed down.   NCM contacted [name] to see if she could help assist with this thru DSS, she is contacting patient's case worker at [place]. Per pt eval rec HHPT/ HHOT, he chose [facility] from agency list.  Referral given to [facility] with [facility] for [place], [place].  Patient has not been to the [facility] urgent care in 4 years, he does not really have a MD there.  He has been to the [facility] , NCM will get him a follow up apt there, his apt is 10/7 at 1:50 at the [facility]. Will need address where patient is going to after discharge in order to receive [facility] services.   Will need [place] orders prior to dc     9/12 Taylor Wymer RN, BSN- per [name] with [facility], she is taking patient off of her referral list because we have no address of where patient is going. He will need to be referred again once we figure out address of where he is going at dc.  Per [name],  Patient is responsible for his own discharge, he has been cleared by psych per MD.  NCM contacted [name] at the [facility] to see if patient could come back, he states if he pays the 400 dollars he can come back and have a room.  This information was given to patient. Patient states he will call his brother to see if he can get the money from him.  NCM informed MD of the information given by [name].  CSW will bring patient bus pass for transportation.  Patient states his friend has his oxygen and will be bringing his oxygen when he picks him up, he has oxygen thru [employer] per patient.  This information was given to [name] and MD and and Agricultural consultant, CSW also gave patient 2 bus passes.    9/13 Taylor Bamberger RN, BSN - NCM asked Assist CSW Director about Hopes program for patient, CSW did application for hopes program and patient was approved.  Patient will be discharged today to [facility] 6 hotel per CSW note , per congregational RN, he will get $100.00 gift card for food, toiletry, he must save receipts and give to the RN,  He will also get 8 bus passes which will be in a envelope at the front desk.   His reservations has been made at the motel, he just needs to show his ID, then he will get a room, this will be for one month.  Also Taylor [name] with [facility] notified for [facility] services that patient is being discharged today.  Patient states his friend will bring his oxygen here to hospital after 7 pm when he gets off work.                                Action/Plan: DC home when ready.  Expected Discharge Date:  10/03/17  Expected Discharge Plan:  [place]  In-House Referral:  Clinical Social Work  Discharge planning Services  CM Consult  Post Acute Care Choice:  Home Health Choice offered to:  Patient  DME Arranged:    DME Agency:     HH Arranged:  RN, Disease Management, PT, OT [place] Agency:  [place]  Status of Service:  Completed, signed off  If discussed at [place] of Stay Meetings, dates discussed:    Additional Comments:  Taylor Mayo, RN 10/03/2017, 3:41 PM

## 2017-10-03 NOTE — Discharge Summary (Signed)
[facility] Discharge Summary   Patient: IRL BODIE HQP:[id number]   PCP: [facility] DOB: [birth year]   Date of admission: 09/28/2017   Date of discharge:  10/03/2017    Discharge Diagnoses:  Principal Problem:   Adjustment disorder with anxiety Active Problems:   COPD  GOLD III   Dyspepsia   Smoker   COPD exacerbation ([place])   COPD with acute exacerbation ([place])   Acute respiratory failure with hypercapnia (HCC)   Chronic respiratory failure with hypoxia and hypercapnia ([place])   Admitted From: home Disposition:  Home with home health  Recommendations for Outpatient Follow-up:  1. Please follow-up with PCP in 1 week.  Follow-up Information    [facility] Follow up on 10/27/2017.   Why:  1:50 pm for hospital follow up Contact information: [place] [phone] [facility].   Specialty:  Girard Why:  HHRN,HHPT, [facility] Contact information: [address] [place] [address] [phone]          Diet recommendation: Cardiac diet  Activity: The patient is advised to gradually reintroduce usual activities.  Discharge Condition: good  Code Status: DNR/DNI  History of present illness: As per the H and P dictated on admission, "JYREN CERASOLI is a 62 y.o. male with medical history significant for COPD still smoking history of pneumonia history of colon cancer bipolar disorder.  Will present with complaint of shortness of breath and anxiety he stated he just could not function anymore he became really short of breath with any little activity and could not complete his ADL he denies fever cough wheezing chest pain he admits to lightheadedness when he moves around because of his shortness of breath he is also feeling extremely anxious.  He was seen in the emergency room September 6 for the same  problem he was discharged on Xanax he stated he has not been taking it he is on 3 L of oxygen at home baseline he called EMS and EMS gave him breathing treatment prior to arrival to the emergency department.  He is also on chronic steroid but he had not taken anything today.  He had a recent CT a month ago which was negative for pulmonary embolism.Marland Kitchen  He also stated that he is a prior for sex offender as a result he is living in a motel.  When he came in he was severely short of breath and hypoxic"  Hospital Course:  Summary of his active problems in the hospital is as following. COPD exacerbation with acute on chronic hypoxic and hypercarbic respiratory failure -Continue nebulizers Pulmicort, duo nebs, steroids with prednisone as well as Levaquin -Continues to remain significantly dyspneic with ambulation although his oxygenation remained stable on 3 L on exertion. -Social worker involved regarding placement issues this patient has significant failure to thrive and currently living in a motel.  -   Tobacco abuse -Encouraged cessation, he is determined to quit  Anxiety -Continue Abilify and discontinue alprazolam as this is not patient's long-term medication based on review of [place] controlled substance. Instead I will place the patient on BuSpar and increase his Lexapro.  Bipolar disorder Suicidal ideation -Continue Abilify Psychiatric consulted, appreciate their assistance. Patient is cleared from psychiatric perspective to be discharged also to the hospital without any inpatient psychiatric hospitalization.  Appreciate their input.  Hyperlipidemia -Continue statin  BPH -Continue Flomax  Homelessness -Education officer, museum consult case Freight forwarder discussed with Clinical biochemist.  Patient was evaluated by [facility] program.  Patient will be provided assistance for the motel, food and toiletry as well as transportation.  HLD -on statin  All other chronic medical condition were stable during the  hospitalization.  Patient was ambulatory without any assistance. On the day of the discharge the patient's vitals were stable , and no other acute medical condition were reported by patient. the patient was felt safe to be discharge at home with family.  Consultants: none Procedures: none  DISCHARGE MEDICATION: Allergies as of 10/03/2017      Reactions   Codeine Nausea And Vomiting      Medication List    STOP taking these medications   doxycycline 100 MG tablet Commonly known as:  VIBRA-TABS   predniSONE 20 MG tablet Commonly known as:  DELTASONE     TAKE these medications   albuterol 108 (90 Base) MCG/ACT inhaler Commonly known as:  PROVENTIL HFA;VENTOLIN HFA Inhale 2 puffs into the lungs every 6 (six) hours as needed for wheezing or shortness of breath. What changed:  when to take this   albuterol (5 MG/ML) 0.5% nebulizer solution Commonly known as:  PROVENTIL Take 1 mL (5 mg total) by nebulization every 6 (six) hours as needed for wheezing or shortness of breath. What changed:  Another medication with the same name was changed. Make sure you understand how and when to take each.   ARIPiprazole 5 MG tablet Commonly known as:  ABILIFY Take 1 tablet (5 mg total) by mouth daily with breakfast.   blood glucose meter kit and supplies Kit Dispense based on patient and insurance preference. Use up to four times daily as directed. (FOR ICD-9 250.00, 250.01).   busPIRone 5 MG tablet Commonly known as:  BUSPAR Take 1 tablet (5 mg total) by mouth 3 (three) times daily.   escitalopram 20 MG tablet Commonly known as:  LEXAPRO Take 1 tablet (20 mg total) by mouth daily. What changed:    medication strength  how much to take   folic acid 1 MG tablet Commonly known as:  FOLVITE Take 1 tablet (1 mg total) by mouth daily.   guaiFENesin 600 MG 12 hr tablet Commonly known as:  MUCINEX Take 1 tablet (600 mg total) by mouth 2 (two) times daily.   lovastatin 20 MG  tablet Commonly known as:  MEVACOR Take 1 tablet (20 mg total) by mouth at bedtime.   metFORMIN 500 MG tablet Commonly known as:  GLUCOPHAGE Take 1 tablet (500 mg total) by mouth 2 (two) times daily with a meal.   mometasone-formoterol 100-5 MCG/ACT Aero Commonly known as:  DULERA Inhale 2 puffs into the lungs 2 (two) times daily.   nicotine 21 mg/24hr patch Commonly known as:  NICODERM CQ - dosed in mg/24 hours Place 1 patch (21 mg total) onto the skin daily.   OXYGEN Inhale 3 L into the lungs continuous.   tamsulosin 0.4 MG Caps capsule Commonly known as:  FLOMAX Take 0.4 mg by mouth daily.   thiamine 100 MG tablet Take 1 tablet (100 mg total) by mouth daily.   tiotropium 18 MCG inhalation capsule Commonly known as:  SPIRIVA Place 1 capsule (18 mcg total) into inhaler and inhale daily.            Durable Medical Equipment  (From admission, onward)         Start     Ordered   10/03/17 1515  For home use only DME Shower stool  Once     10/03/17 1514   10/03/17 1508  For home use only DME oxygen  Once    Question Answer Comment  Mode or (Route) Nasal cannula   Liters per Minute 3   Frequency Continuous (stationary and portable oxygen unit needed)   Oxygen delivery system Gas      10/03/17 1507   10/02/17 1606  For home use only DME oxygen  Once    Question Answer Comment  Mode or (Route) Nasal cannula   Liters per Minute 3   Frequency Continuous (stationary and portable oxygen unit needed)   Oxygen delivery system Gas      10/02/17 1605         Allergies  Allergen Reactions  . Codeine Nausea And Vomiting   Discharge Instructions    Diet - low sodium heart healthy   Complete by:  As directed    Discharge instructions   Complete by:  As directed    It is important that you read following instructions as well as go over your medication list with RN to help you understand your care after this hospitalization.  Discharge Instructions: Please  follow-up with PCP in one week  Please request your primary care physician to go over all Hospital Tests and Procedure/Radiological results at the follow up,  Please get all Hospital records sent to your PCP by signing hospital release before you go home.   Do not take more than prescribed Pain, Sleep and Anxiety Medications. You were cared for by a hospitalist during your hospital stay. If you have any questions about your discharge medications or the care you received while you were in the hospital after you are discharged, you can call the unit and ask to speak with the hospitalist on call if the hospitalist that took care of you is not available.  Once you are discharged, your primary care physician will handle any further medical issues. Please note that NO REFILLS for any discharge medications will be authorized once you are discharged, as it is imperative that you return to your primary care physician (or establish a relationship with a primary care physician if you do not have one) for your aftercare needs so that they can reassess your need for medications and monitor your lab values. You Must read complete instructions/literature along with all the possible adverse reactions/side effects for all the Medicines you take and that have been prescribed to you. Take any new Medicines after you have completely understood and accept all the possible adverse reactions/side effects. Wear Seat belts while driving. If you have smoked or chewed Tobacco in the last 2 yrs please stop smoking and/or stop any Recreational drug use.   Increase activity slowly   Complete by:  As directed      Discharge Exam: Filed Weights   09/28/17 1612  Weight: 70.5 kg   Vitals:   10/03/17 0922 10/03/17 1500  BP:  124/78  Pulse:  78  Resp:  18  Temp:  99 F (37.2 C)  SpO2: 96% 98%   General: Appear in mild distress, no Rash; Oral Mucosa moist. Cardiovascular: S1 and S2 Present, no Murmur, no JVD Respiratory:  Bilateral Air entry present and Clear to Auscultation, no Crackles, no wheezes Abdomen: Bowel Sound present, Soft and no tenderness Extremities: no Pedal edema, no calf tenderness Neurology: Grossly no focal neuro deficit.  The results of significant diagnostics from this hospitalization (including imaging, microbiology, ancillary  and laboratory) are listed below for reference.    Significant Diagnostic Studies: Dg Chest 2 View  Result Date: 09/22/2017 CLINICAL DATA:  Patient with shortness of breath. EXAM: CHEST - 2 VIEW COMPARISON:  Chest radiograph 09/21/2017 FINDINGS: Monitoring leads overlie the patient. Normal cardiac and mediastinal contours. Pulmonary hyperinflation. No pleural effusion or pneumothorax. Thoracic spine degenerative changes. Old right rib fractures. IMPRESSION: Pulmonary hyperinflation.  No acute cardiopulmonary process. Electronically Signed   By: Lovey Newcomer M.D.   On: 09/22/2017 15:50   Dg Chest Port 1 View  Result Date: 09/28/2017 CLINICAL DATA:  Shortness of breath. EXAM: PORTABLE CHEST 1 VIEW COMPARISON:  September 26, 2017. FINDINGS: Hyperinflation of the lungs. Diaphragm flattening. The heart, hila, mediastinum, lungs, and pleura are otherwise unremarkable. IMPRESSION: Hyperinflation of the lungs suggesting COPD or emphysema. No other acute abnormalities. Electronically Signed   By: Dorise Bullion III M.D   On: 09/28/2017 11:13   Dg Chest Port 1 View  Result Date: 09/26/2017 CLINICAL DATA:  One month history of heartburn and shortness of breath with increasing severity over the past week. History of COPD, current smoker. The patient porch being on home oxygen at home and is on prednisone. History of colonic and prostate malignancy. EXAM: PORTABLE CHEST 1 VIEW COMPARISON:  PA and lateral chest x-ray of September 22, 2017 FINDINGS: The lungs are hyperinflated with hemidiaphragm flattening. There is no focal infiltrate. There is no pleural effusion. The heart and pulmonary  vascularity are normal. The mediastinum is normal in width. There are old deformities of the right third through fifth ribs posterolaterally. IMPRESSION: COPD. No pneumonia, CHF, nor other acute cardiopulmonary abnormality. Electronically Signed   By: David  Martinique M.D.   On: 09/26/2017 12:48   Dg Chest Port 1 View  Result Date: 09/21/2017 CLINICAL DATA:  Shortness of breath and wheezing EXAM: PORTABLE CHEST 1 VIEW COMPARISON:  09/14/2017 FINDINGS: Similar hyperinflation compatible with COPD/emphysema. No focal pneumonia, collapse or consolidation. Negative for edema, effusion or pneumothorax. Trachea midline. Normal heart size and vascularity. Monitor leads overlie the chest. Remote posterior right rib fractures. IMPRESSION: Stable hyperinflation. No interval change or superimposed acute process Electronically Signed   By: Jerilynn Mages.  Shick M.D.   On: 09/21/2017 09:14   Dg Chest Port 1 View  Result Date: 09/14/2017 CLINICAL DATA:  Cough and dyspnea EXAM: PORTABLE CHEST 1 VIEW COMPARISON:  09/10/2017 chest radiograph. FINDINGS: Stable cardiomediastinal silhouette with normal heart size. No pneumothorax. No pleural effusion. Hyperinflated lungs. Emphysema. No pulmonary edema. No acute consolidative airspace disease. Healed deformities in multiple posterior right ribs. IMPRESSION: 1. No acute cardiopulmonary disease. 2. Hyperinflated lungs and emphysema, suggesting COPD. Electronically Signed   By: Ilona Sorrel M.D.   On: 09/14/2017 14:23   Dg Chest Portable 1 View  Result Date: 09/10/2017 CLINICAL DATA:  Shortness of breath. EXAM: PORTABLE CHEST 1 VIEW COMPARISON:  Multiple prior exams. Most recent radiograph 09/04/2017. Most recent CT 08/24/2017 FINDINGS: Chronic hyperinflation with emphysema. Normal heart size and mediastinal contours. No focal airspace opacity, pleural effusion, pneumothorax or pulmonary edema. Remote right rib fractures again seen. IMPRESSION: 1. No acute findings. 2.  Emphysema (ICD10-J43.9).  Electronically Signed   By: Jeb Levering M.D.   On: 09/10/2017 22:40   Dg Chest Portable 1 View  Result Date: 09/04/2017 CLINICAL DATA:  62 y/o  M; shortness of breath and chest pain. EXAM: PORTABLE CHEST 1 VIEW COMPARISON:  08/30/2017 chest radiograph. FINDINGS: Normal cardiac silhouette. Stable emphysema and hyperinflation of the lungs. No  consolidation. Chronic blunting of left costodiaphragmatic angle. No pleural effusion or pneumothorax. No acute osseous abnormality is evident. IMPRESSION: No active disease.  Stable emphysema. Electronically Signed   By: Kristine Garbe M.D.   On: 09/04/2017 20:28    Microbiology: No results found for this or any previous visit (from the past 240 hour(s)).   Labs: CBC: Recent Labs  Lab 09/28/17 1122 09/29/17 0311 10/01/17 0302  WBC 7.6 6.8 6.2  NEUTROABS 5.5  --   --   HGB 13.3 12.3* 12.4*  HCT 42.7 38.3* 38.0*  MCV 98.8 95.5 93.4  PLT 162 153 448*   Basic Metabolic Panel: Recent Labs  Lab 09/28/17 1122 09/29/17 0311 10/01/17 0302  NA 144 140 138  K 3.8 4.6 4.0  CL 102 97* 98  CO2 34* 37* 32  GLUCOSE 164* 220* 225*  BUN '21 12 14  ' CREATININE 0.71 0.61 0.61  CALCIUM 8.0* 8.1* 7.8*   Liver Function Tests: Recent Labs  Lab 09/28/17 1134  AST 21  ALT 36  ALKPHOS 44  BILITOT 0.5  PROT 5.3*  ALBUMIN 3.5   No results for input(s): LIPASE, AMYLASE in the last 168 hours. No results for input(s): AMMONIA in the last 168 hours. Cardiac Enzymes: No results for input(s): CKTOTAL, CKMB, CKMBINDEX, TROPONINI in the last 168 hours. BNP (last 3 results) Recent Labs    06/13/17 1003 08/24/17 1838 09/10/17 2215  BNP 33.8 34.7 94.6   CBG: Recent Labs  Lab 10/02/17 1647 10/02/17 2238 10/03/17 0721 10/03/17 1201 10/03/17 1636  GLUCAP 222* 336* 136* 89 120*   Time spent: 35 minutes  Signed:  Berle Mull  [facility]  10/03/2017  , 5:13 PM

## 2017-10-03 NOTE — Social Work (Signed)
CSW received message from Venia Minks with [facility] DSS-returned call and left HIPPA compliant message on machine regarding case. CSW also has spoken again with assistant director Nathaniel Man who states that the [facility] and congregational nurse program is reviewing pt's clinicals and it may take up to 48 hours to receive determination. CSW dept does not currently have any additional placement support available. Will f/u with pt to see if he has been in contact with any family or friends who may be able to assist pt at discharge.  Alexander Mt, [place] Work [phone]

## 2017-10-03 NOTE — Clinical Social Work Note (Signed)
CSW informed by nurse case manager that patient has been approved to receive assistance through the [employer] as he is homeless, on oxygen, has had frequent admissions, and currently has no money left from his disability and SSI. CSW talked with patient, form completed and given to Nathaniel Man, Surveyor, quantity.  Calisha Tindel Givens, [place] Social Work [phone]

## 2017-10-03 NOTE — Progress Notes (Signed)
Physical Therapy Treatment Patient Details Name: Taylor [name] MRN: [id number] DOB: Jul 07, 1955 Today's Date: 10/03/2017    History of Present Illness Patient is a 62 y/o male with numerouse visits to ED in last 2 weeks admitted with primary complaints of SOB with hypoxia and anxiety. PMH significant for bipolar disorder, advanced COPD on home oxygen typically 2 L/min, depression, tobacco abuse,Colon and prostate cancer who was recently discharged from the hospital with exacerbation of COPD. Chest x-ray showed no active findings.    PT Comments    Pt expects to d/c this evening, and is agreeable to ambulation with therapy to work on energy conservation and managing O2 bottle with ambulation. Pt is currently mod I with all mobility and was able to maintain SaO2 >93%O2 throughout ambulation despite needing standing rest breaks for 4/4 DoE. Pt educated on need for small bouts of activity every hour to build endurance.    Follow Up Recommendations  Home health PT;Supervision - Intermittent     Equipment Recommendations  None recommended by PT    Recommendations for Other Services       Precautions / Restrictions Precautions Precautions: Fall Precaution Comments: watch O2 sats, reports he has never fallen Restrictions Weight Bearing Restrictions: No    Mobility  Bed Mobility Overal bed mobility: Modified Independent Bed Mobility: Supine to Sit     Supine to sit: Modified independent (Device/Increase time);HOB elevated     General bed mobility comments: increased effort use of bed rails,   Transfers Overall transfer level: Needs assistance Equipment used: None Transfers: Sit to/from Stand Sit to Stand: Modified independent (Device/Increase time)         General transfer comment: increased time and effort  Ambulation/Gait Ambulation/Gait assistance: Modified independent (Device/Increase time)   Assistive device: None Gait Pattern/deviations: Step-through  pattern;Decreased stride length;Narrow base of support;Shuffle Gait velocity: slowed   General Gait Details: mod I for increased effort, requires 3x standing rest break for 4/4 DoE         Balance Overall balance assessment: Needs assistance Sitting-balance support: Feet supported Sitting balance-Leahy Scale: Good     Standing balance support: No upper extremity supported Standing balance-Leahy Scale: Fair                              Cognition Arousal/Alertness: Awake/alert Behavior During Therapy: WFL for tasks assessed/performed Overall Cognitive Status: Within Functional Limits for tasks assessed                                           General Comments General comments (skin integrity, edema, etc.): HR max with ambulation 95%, SaO2 on 4L via [place] >93%O2 throughout session despite 4/4 DoE      Pertinent Vitals/Pain Pain Assessment: No/denies pain           PT Goals (current goals can now be found in the care plan section) Acute Rehab PT Goals Patient Stated Goal: to live some place where someone could help him out when he is having trouble breathing PT Goal Formulation: With patient Time For Goal Achievement: 10/13/17 Potential to Achieve Goals: Fair Progress towards PT goals: Progressing toward goals    Frequency    Min 3X/week      PT Plan Current plan remains appropriate       AM-PAC PT "6 Clicks" Daily Activity  Outcome  Measure  Difficulty turning over in bed (including adjusting bedclothes, sheets and blankets)?: None Difficulty moving from lying on back to sitting on the side of the bed? : A Little Difficulty sitting down on and standing up from a chair with arms (e.g., wheelchair, bedside commode, etc,.)?: A Little Help needed moving to and from a bed to chair (including a wheelchair)?: A Little Help needed walking in hospital room?: A Little Help needed climbing 3-5 steps with a railing? : A Lot 6 Click Score:  18    End of Session Equipment Utilized During Treatment: Gait belt;Oxygen Activity Tolerance: Patient limited by fatigue Patient left: Other (comment)(left pt at sink in room with OT) Nurse Communication: Mobility status PT Visit Diagnosis: Other abnormalities of gait and mobility (R26.89);Muscle weakness (generalized) (M62.81)     Time: 8676-7209 PT Time Calculation (min) (ACUTE ONLY): 13 min  Charges:  $Gait Training: 8-22 mins                     Taina Landry B. Migdalia Dk PT, DPT Acute Rehabilitation Services Pager [phone] Office [phone]    [place] 10/03/2017, 5:32 PM

## 2017-10-03 NOTE — Social Work (Addendum)
Spoke with [facility] congregational nurse, pt has been approved for [facility]. Pt will discharge to [facility], [address] [place], [place]. Pt will be assigned a room number today. CSW will reach out to RN Case Manager so that she can let [facility] services know.   4:30pm- Pt completed paperwork for [employer], pt friend will pick him up at 7pm and transport pt to hotel at that time with his oxygen supplies. Pt understands and has read over contract. Original and copy left with assistant director Nathaniel Man. Pt gave verbal permission for Venia Minks to be given address of motel to discuss further placement.   CSW signing off.   Alexander Mt, [place] Work [phone]
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
## 2017-10-14 ENCOUNTER — Inpatient Hospital Stay: Payer: Self-pay | Admitting: Primary Care

## 2017-10-14 ENCOUNTER — Encounter (HOSPITAL_COMMUNITY): Payer: Self-pay

## 2017-10-14 ENCOUNTER — Emergency Department (HOSPITAL_COMMUNITY)
Admission: EM | Admit: 2017-10-14 | Discharge: 2017-10-14 | Disposition: A | Discharge status: Home / Self Care | Payer: Medicaid Other | Attending: Emergency Medicine | Admitting: Emergency Medicine

## 2017-10-14 DIAGNOSIS — Z79899 Other long term (current) drug therapy: Secondary | ICD-10-CM | POA: Insufficient documentation

## 2017-10-14 DIAGNOSIS — R3916 Straining to void: Secondary | ICD-10-CM

## 2017-10-14 DIAGNOSIS — N401 Enlarged prostate with lower urinary tract symptoms: Secondary | ICD-10-CM | POA: Diagnosis not present

## 2017-10-14 DIAGNOSIS — Z87891 Personal history of nicotine dependence: Secondary | ICD-10-CM | POA: Insufficient documentation

## 2017-10-14 DIAGNOSIS — Z7984 Long term (current) use of oral hypoglycemic drugs: Secondary | ICD-10-CM | POA: Insufficient documentation

## 2017-10-14 DIAGNOSIS — J45909 Unspecified asthma, uncomplicated: Secondary | ICD-10-CM | POA: Diagnosis not present

## 2017-10-14 DIAGNOSIS — R339 Retention of urine, unspecified: Secondary | ICD-10-CM | POA: Diagnosis present

## 2017-10-14 MED ORDER — TAMSULOSIN HCL 0.4 MG PO CAPS: 0.4000 mg | ORAL_CAPSULE | Freq: Every day | ORAL | 0 refills | Status: DC

## 2017-10-14 MED ORDER — THIAMINE HCL 100 MG PO TABS: 100.0000 mg | ORAL_TABLET | Freq: Every day | ORAL | 1 refills | Status: DC

## 2017-10-14 MED ORDER — MOMETASONE FURO-FORMOTEROL FUM 100-5 MCG/ACT IN AERO: 2.0000 | INHALATION_SPRAY | Freq: Two times a day (BID) | RESPIRATORY_TRACT | 12 refills | Status: DC

## 2017-10-14 MED ORDER — FOLIC ACID 1 MG PO TABS: 1.0000 mg | ORAL_TABLET | Freq: Every day | ORAL | 1 refills | Status: DC

## 2017-10-14 MED ORDER — ESCITALOPRAM OXALATE 20 MG PO TABS: 20.0000 mg | ORAL_TABLET | Freq: Every day | ORAL | 0 refills | Status: DC

## 2017-10-14 MED ORDER — ALBUTEROL SULFATE HFA 108 (90 BASE) MCG/ACT IN AERS: 2.0000 | INHALATION_SPRAY | Freq: Four times a day (QID) | RESPIRATORY_TRACT | 3 refills | Status: DC | PRN

## 2017-10-14 MED ORDER — METFORMIN HCL 500 MG PO TABS: 500.0000 mg | ORAL_TABLET | Freq: Two times a day (BID) | ORAL | 0 refills | Status: DC

## 2017-10-14 MED ORDER — ARIPIPRAZOLE 5 MG PO TABS: 5.0000 mg | ORAL_TABLET | Freq: Every day | ORAL | 0 refills | Status: DC

## 2017-10-14 NOTE — ED Notes (Signed)
Bed: WA03 Expected date:  Expected time:  Means of arrival:  Comments: EMS-respiratory distress

## 2017-10-14 NOTE — ED Triage Notes (Signed)
Patient coming from home. [employer] with c/o urinary retention and groin pain. Pt is on 3 L of oxygen now and at home. Hx of COPD.

## 2017-10-14 NOTE — Discharge Instructions (Addendum)
Please follow-up with urologist as planned.  Start taking Flomax for the prostate.
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
## 2017-10-14 NOTE — ED Notes (Signed)
Bed: WA08 Expected date:  Expected time:  Means of arrival:  Comments: Hold-EMS

## 2017-10-14 NOTE — ED Notes (Signed)
Writer completed bladder scan, Scan showed 128ml

## 2017-10-14 NOTE — Care Management Note (Signed)
Case Management Note  CM was advised that pt does not have any home O2.  CM spoke with pt who advised his portable tanks are empty at home but does report having his concentrator.  He reported he has been in contact with [facility] who will be bringing more tanks to him later this week.  CM asked if he can refill his tanks from his concentrator.  Pt did not seem aware that this could be done.  CM updated him that he will be transported home via [facility].  CM contacted [facility] DME who advised that they will not be seeing pt again until the 2nd week of Oct.  He missed his Sept month appointment. [facility] will have [facility] coming out prior to his next monthly appointment.  They provided contact information for Arther Abbott at [phone] ext. [phone].  He will have to tell them he missed his appointment and they will have to approve [facility] to come sooner.  CM attempted to speak with pt again but he had already left by [facility].  CM contacted Santiago Glad with [facility] and updated her on pt's O2 DME needs.  She will provide information to pt's [facility] team to assist pt with contacting the [facility], getting new tanks, and possibly teaching him how to refill his tanks from his concentrator.  No further CM needs noted at this time.  Ra Pfiester, Benjaman Lobe, RN 10/14/2017, 3:28 PM

## 2017-10-14 NOTE — Care Management Note (Signed)
Case Management Note  Patient Details  Name: Taylor [name] MRN: [id number] Date of Birth: 1955-02-21  CM noted pt's return to the ED.  Contacted Santiago Glad with [facility] who advised his [facility] team has been unable to reach pt since his last hospital D/C on 9/13.    CM spoke with pt at bedside who advised he is now staying at [facility] [address] phone number [phone] room [phone].  He also states he is working with [employer], [phone], and Santiago Glad [phone] and the [employer] program to find housing.    CM updated pt's contact information with Santiago Glad with [facility].    Updated Dr. Kathrynn Humble who will place new face-to-face orders for pt.  No further CM needs noted at this time.  Expected Discharge Date:   10/14/2017               Expected Discharge Plan:  [place]  Discharge planning Services  CM Consult  Post Acute Care Choice:  Home Health Choice offered to:  Patient  HH Arranged:  RN, PT, OT, Nurse's Aide, Social Work [employer] Agency:  [employer]  Status of Service:  Completed, signed off  Rae Mar, RN 10/14/2017, 2:06 PM

## 2017-10-14 NOTE — ED Notes (Signed)
[facility] called for transport back to residence 

## 2017-10-15 ENCOUNTER — Telehealth: Payer: Self-pay

## 2017-10-15 NOTE — Congregational Nurse Program (Signed)
Visit to client at [facility] today 10/13/17 along with SW Santiago Glad.Client answered door. Reports doing alright since last visit with the exception of some dizzy spells and SOB.  Feels good today.  Color good and appears to be less SOB as he moves about in room. Client feels that he needs to get back to see Dr. Marlene Lard so that he can get his meds for COPD refilled.  Complains of urgency and feeling pressure when he sits down and also has had some diarrhea. Client feels this may be related to his diagnosis of prostate cancer two years ago while he was imprisoned.  He states that he never had this treated while there and when he got out he never followed up with it.  Nurse noted that client had been taking flomax but that it had been discontinued at discharge from hospital.  Nurse asked client if he would consider a [facility] facility with his health issues and the likelihood of not finding affordable housing anytime soon.  Client ask if he would be allowed to come and go if he stayed there.  SW discussed cell phone and transportation.  B/P 130/78 P-80 R-22. O2 nasal cannula at 3l/min as ordered. Lungs with diminished breath sounds, mucous membranes and nail beds pink and client reports being less SOB. Client out of Lovastatin and Lexapro Plan: T.C to clinic to speak with Dr. Altamease Oiler, no answer, left message for someone to call nurse back re: client. T.C also to Dr. Marlene Lard office and was able to get appt. For client to be seen Tuesday 10/14/17 at 3:00p.m. Client assured me that his friend would take him to this appt. Nurse will await call back from Dr. Altamease Oiler re: meds and urinary issues and will let client know when she hears back. Next home visit Monday Sept. 30.  Aden Sek D. Joneen Caraway MSN, Advice worker Nurse [phone]

## 2017-10-15 NOTE — Telephone Encounter (Signed)
Taylor [name] received from a [facility] at [facility] on Tuesday morning 10/14/17 regarding nurse's call to them the day before. Taylor [name] and nurse went over client's medication list and Taylor [name] stated that all medication with the exception of Lexapro were at the [facility]. Lexapro had been filled at [facility]. Nurse reported to [place] client's complaint of urinary urgency, pressure and diarrhea and past history of prostate cancer. Taylor [name] will have nurse to phone me back. Reminded of appt. For Oct. 7.  Nurse will await a call back.     10/15/17 TC from client stating that he has been trying to call [place] to get new O2 tanks with no success and could I call them for him as he will soon be out of oxygen and has doctor appointments coming up. Nurse gathered information needed and made several calls before finally getting the [facility] hospital to agree to make a delivery on Thursday or Friday per [place]. Taylor [name] back to client to inform him and to let the front desk at the hotel know that he will be getting this delivery if he will not be there. Client states he will do this.  Client later called nurse again requesting to pick up medications ordered for him from the hospital on yesterday.  Nurse wasn't aware until now that client went to hospital.  He states he called EMS as he was in so much pain from urinating and pressure.  States that a prescription for flomax was called to [place] at [facility].  Nurse unable to pick up meds but Debbie Nurse Coordinator for [employer] will pick up tomorrow and deliver meds to client.  Client to reimburse [facility] for co-pays. Client was referred to a urologist.  Nurse will see client on Monday Sept. 30.  Carlyne Keehan D. Joneen Caraway MSN, Advice worker Nurse [phone]

## 2017-10-17 ENCOUNTER — Ambulatory Visit (INDEPENDENT_AMBULATORY_CARE_PROVIDER_SITE_OTHER): Payer: Medicaid Other | Admitting: Primary Care

## 2017-10-17 ENCOUNTER — Encounter: Payer: Self-pay | Admitting: Primary Care

## 2017-10-17 DIAGNOSIS — J449 Chronic obstructive pulmonary disease, unspecified: Secondary | ICD-10-CM | POA: Diagnosis not present

## 2017-10-17 DIAGNOSIS — F319 Bipolar disorder, unspecified: Secondary | ICD-10-CM | POA: Diagnosis not present

## 2017-10-17 DIAGNOSIS — E099 Drug or chemical induced diabetes mellitus without complications: Secondary | ICD-10-CM

## 2017-10-17 DIAGNOSIS — T380X5A Adverse effect of glucocorticoids and synthetic analogues, initial encounter: Secondary | ICD-10-CM

## 2017-10-17 MED ORDER — FLUTICASONE-UMECLIDIN-VILANT 100-62.5-25 MCG/INH IN AEPB: 1.0000 | INHALATION_SPRAY | Freq: Every day | RESPIRATORY_TRACT | 2 refills | Status: DC

## 2017-10-17 MED ORDER — FLUTICASONE-UMECLIDIN-VILANT 100-62.5-25 MCG/INH IN AEPB: 1.0000 | INHALATION_SPRAY | Freq: Every day | RESPIRATORY_TRACT | 11 refills | Status: DC

## 2017-10-17 NOTE — Patient Instructions (Addendum)
Chronic obstructive pulmonary disorder: Plan A= COPD maintenance therapy Trelegy 1 puff daily (do not over take this, it has three medications in it) Plan B = Back up Albuterol rescue inhaler 2 puffs every 4-6 hrs for shortness of breath Plan C = Albuterol nebulizer every 4-6 hours for sob/wheezing  Continue wearing oxygen 2 L   Anxiety/Bipolar disorder: Continue Ability  Continue Lexapro New Buspar medication   Diabetes: Your A1C was elevated at 8.4 (normal is <5.6) Metformin 500mg  twice a day  Monitor your sugar intake    FU in 4 weeks with Dr. Melvyn Novas    Diabetes Mellitus and Nutrition When you have diabetes (diabetes mellitus), it is very important to have healthy eating habits because your blood sugar (glucose) levels are greatly affected by what you eat and drink. Eating healthy foods in the appropriate amounts, at about the same times every day, can help you:  Control your blood glucose.  Lower your risk of heart disease.  Improve your blood pressure.  Reach or maintain a healthy weight.  Every person with diabetes is different, and each person has different needs for a meal plan. Your health care provider may recommend that you work with a diet and nutrition specialist (dietitian) to make a meal plan that is best for you. Your meal plan may vary depending on factors such as:  The calories you need.  The medicines you take.  Your weight.  Your blood glucose, blood pressure, and cholesterol levels.  Your activity level.  Other health conditions you have, such as heart or kidney disease.  How do carbohydrates affect me? Carbohydrates affect your blood glucose level more than any other type of food. Eating carbohydrates naturally increases the amount of glucose in your blood. Carbohydrate counting is a method for keeping track of how many carbohydrates you eat. Counting carbohydrates is important to keep your blood glucose at a healthy level, especially if you use  insulin or take certain oral diabetes medicines. It is important to know how many carbohydrates you can safely have in each meal. This is different for every person. Your dietitian can help you calculate how many carbohydrates you should have at each meal and for snack. Foods that contain carbohydrates include:  Bread, cereal, rice, pasta, and crackers.  Potatoes and corn.  Peas, beans, and lentils.  Milk and yogurt.  Fruit and juice.  Desserts, such as cakes, cookies, ice cream, and candy.  How does alcohol affect me? Alcohol can cause a sudden decrease in blood glucose (hypoglycemia), especially if you use insulin or take certain oral diabetes medicines. Hypoglycemia can be a life-threatening condition. Symptoms of hypoglycemia (sleepiness, dizziness, and confusion) are similar to symptoms of having too much alcohol. If your health care provider says that alcohol is safe for you, follow these guidelines:  Limit alcohol intake to no more than 1 drink per day for nonpregnant women and 2 drinks per day for men. One drink equals 12 oz of beer, 5 oz of wine, or 1 oz of hard liquor.  Do not drink on an empty stomach.  Keep yourself hydrated with water, diet soda, or unsweetened iced tea.  Keep in mind that regular soda, juice, and other mixers may contain a lot of sugar and must be counted as carbohydrates.  What are tips for following this plan? Reading food labels  Start by checking the serving size on the label. The amount of calories, carbohydrates, fats, and other nutrients listed on the label are based on  one serving of the food. Many foods contain more than one serving per package.  Check the total grams (g) of carbohydrates in one serving. You can calculate the number of servings of carbohydrates in one serving by dividing the total carbohydrates by 15. For example, if a food has 30 g of total carbohydrates, it would be equal to 2 servings of carbohydrates.  Check the number  of grams (g) of saturated and trans fats in one serving. Choose foods that have low or no amount of these fats.  Check the number of milligrams (mg) of sodium in one serving. Most people should limit total sodium intake to less than 2,300 mg per day.  Always check the nutrition information of foods labeled as "low-fat" or "nonfat". These foods may be higher in added sugar or refined carbohydrates and should be avoided.  Talk to your dietitian to identify your daily goals for nutrients listed on the label. Shopping  Avoid buying canned, premade, or processed foods. These foods tend to be high in fat, sodium, and added sugar.  Shop around the outside edge of the grocery store. This includes fresh fruits and vegetables, bulk grains, fresh meats, and fresh dairy. Cooking  Use low-heat cooking methods, such as baking, instead of high-heat cooking methods like deep frying.  Cook using healthy oils, such as olive, canola, or sunflower oil.  Avoid cooking with butter, cream, or high-fat meats. Meal planning  Eat meals and snacks regularly, preferably at the same times every day. Avoid going long periods of time without eating.  Eat foods high in fiber, such as fresh fruits, vegetables, beans, and whole grains. Talk to your dietitian about how many servings of carbohydrates you can eat at each meal.  Eat 4-6 ounces of lean protein each day, such as lean meat, chicken, fish, eggs, or tofu. 1 ounce is equal to 1 ounce of meat, chicken, or fish, 1 egg, or 1/4 cup of tofu.  Eat some foods each day that contain healthy fats, such as avocado, nuts, seeds, and fish. Lifestyle   Check your blood glucose regularly.  Exercise at least 30 minutes 5 or more days each week, or as told by your health care provider.  Take medicines as told by your health care provider.  Do not use any products that contain nicotine or tobacco, such as cigarettes and e-cigarettes. If you need help quitting, ask your  health care provider.  Work with a Social worker or diabetes educator to identify strategies to manage stress and any emotional and social challenges. What are some questions to ask my health care provider?  Do I need to meet with a diabetes educator?  Do I need to meet with a dietitian?  What number can I call if I have questions?  When are the best times to check my blood glucose? Where to find more information:  American Diabetes Association: diabetes.org/food-and-fitness/food  Academy of Nutrition and Dietetics: PokerClues.dk  [employer] of Diabetes and Digestive and Kidney Diseases (NIH): ContactWire.be Summary  A healthy meal plan will help you control your blood glucose and maintain a healthy lifestyle.  Working with a diet and nutrition specialist (dietitian) can help you make a meal plan that is best for you.  Keep in mind that carbohydrates and alcohol have immediate effects on your blood glucose levels. It is important to count carbohydrates and to use alcohol carefully. This information is not intended to replace advice given to you by your health care provider. Make sure you  discuss any questions you have with your health care provider. Document Released: 10/04/2004 Document Revised: 02/12/2016 Document Reviewed: 02/12/2016 Elsevier Interactive Patient Education  [employer].

## 2017-10-17 NOTE — Progress Notes (Signed)
_0  ID: Taylor [name], male    DOB: Jun 29, 1955, 62 y.o.   MRN: [id number]  Chief Complaint  Patient presents with  . Follow-up    SOB    Referring provider: [facility], Georga [facility] Urge*  HPI: 62 year old male, current smoker. PMH COPD GOLD III, acute on chronic resp failure (on home oxygen), Sigmoid colon cancer, prostate cancer, alcohol abuse, bipolar disorder. Patient of Dr. Melvyn Novas, last seen by pulmonary NP on 04/15/17 for COPD exacerbation. DNR/DNI status.   Patient has had three hospital admission for COPD exacerbation in the last month ( 08/25, 9/2, 9/8). Seen in ED on 9/24 for urinary retention, restarted on Flomax. Most recent hospital admission from 09/8-09/13 for sob with slight activity. Given breathing treatment by EMS. On chronic prednisone but did not take. CXR on 09/08 showed hyperinflated lungs suggesting COPD or emphysema. No other acute abnormalities. Discharge with Pulmicort, duonebs, prednisone and Levquin. Set up with case management, evaluated by [facility] program and provided assistance for motel, food, toiletry and transportation. Has follow-up with [facility] on 10/27/17 at 1:50 for hospital fu.   10/21/2017 Patient presents today for hospital follow-up visit. Complains of shortness of breath, O2 sat 95% 2L. Feels out of breath with slight activity. No energy. Quit smoking 5 days ago. Ran out of Dulera and Spiriva. States that he was using them more than prescribed and did not find them particularly helpful. Underlying anxiety, takes Abilify and lexapro dose increased. Newly added Buspar but patient has not started. Dx diabetes, no taking medication. A1C 8.4. Eats a lot of candy. Ordered for Metformin 529m twice daily. Denies cough or mucus production. Getting home nursing services once a week.    Allergies  Allergen Reactions  . Codeine Nausea And Vomiting    Immunization History  Administered Date(s) Administered  .  Influenza Split 12/13/2015  . Influenza,inj,Quad PF,6+ Mos 01/25/2013, 10/28/2016, 09/29/2017  . Tdap 10/28/2016    Past Medical History:  Diagnosis Date  . Asthma   . Bipolar 1 disorder ([place] 02/05/2012  . [place]   adenocarcinoma of colon, 7/19 nodes pos.FINISHED CHEMO/DR. SHERRILL  . COPD ([place] obstructive pulmonary disease) ([place]    SMOKER  . Depression   . Emphysema of lung ([facility]   . Full dentures   . Hemorrhoids   . On home oxygen therapy    "3L; 24/7" (05/29/2017)  . Pneumonia ~ 2016   "double pneumonia"  . Prostate cancer ([place]    Gleason score = 7, supposed to have radiation therapy but he has not followed up (05/29/2017)  . Rib fractures    hx of    Tobacco History: Social History   Tobacco Use  Smoking Status Former Smoker  . Packs/day: 0.10  . Years: 46.00  . Pack years: 4.60  . Types: Cigarettes  . Last attempt to quit: 05/21/2017  . 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 Types: Chew   Counseling given: Not Answered   Outpatient Medications Prior to Visit  Medication Sig Dispense Refill  . albuterol (PROVENTIL HFA;VENTOLIN HFA) 108 (90 Base) MCG/ACT inhaler Inhale 2 puffs into the lungs every 6 (six) hours as needed for wheezing or shortness of breath. 1 Inhaler 3  . albuterol (PROVENTIL) (5 MG/ML) 0.5% nebulizer solution Take 1 mL (5 mg total) by nebulization every 6 (six) hours as needed for wheezing or shortness of breath. 30 vial 3  . ARIPiprazole (ABILIFY) 5 MG  tablet Take 1 tablet (5 mg total) by mouth daily with breakfast. 30 tablet 0  . escitalopram (LEXAPRO) 20 MG tablet Take 1 tablet (20 mg total) by mouth daily. 30 tablet 0  . mometasone-formoterol (DULERA) 100-5 MCG/ACT AERO Inhale 2 puffs into the lungs 2 (two) times daily. 1 Inhaler 12  . nicotine (NICODERM CQ - DOSED IN MG/24 HOURS) 21 mg/24hr patch Place 1 patch (21 mg total) onto the skin daily. 28 patch 0  . OXYGEN Inhale 3 L into the lungs  continuous.     . tamsulosin (FLOMAX) 0.4 MG CAPS capsule Take 1 capsule (0.4 mg total) by mouth daily. 30 capsule 0  . tiotropium (SPIRIVA HANDIHALER) 18 MCG inhalation capsule Place 1 capsule (18 mcg total) into inhaler and inhale daily. 30 capsule 0  . blood glucose meter kit and supplies KIT Dispense based on patient and insurance preference. Use up to four times daily as directed. (FOR ICD-9 250.00, 250.01). (Patient not taking: Reported on 10/17/2017) 1 each 0  . busPIRone (BUSPAR) 5 MG tablet Take 1 tablet (5 mg total) by mouth 3 (three) times daily. (Patient not taking: Reported on 10/17/2017) 15 tablet 0  . folic acid (FOLVITE) 1 MG tablet Take 1 tablet (1 mg total) by mouth daily. (Patient not taking: Reported on 10/17/2017) 30 tablet 1  . guaiFENesin (MUCINEX) 600 MG 12 hr tablet Take 1 tablet (600 mg total) by mouth 2 (two) times daily. (Patient not taking: Reported on 09/28/2017) 20 tablet 0  . lovastatin (MEVACOR) 20 MG tablet Take 1 tablet (20 mg total) by mouth at bedtime. (Patient not taking: Reported on 10/17/2017) 30 tablet 12  . metFORMIN (GLUCOPHAGE) 500 MG tablet Take 1 tablet (500 mg total) by mouth 2 (two) times daily with a meal. (Patient not taking: Reported on 10/17/2017) 60 tablet 0  . thiamine 100 MG tablet Take 1 tablet (100 mg total) by mouth daily. (Patient not taking: Reported on 10/17/2017) 30 tablet 1   No facility-administered medications prior to visit.     Review of Systems  Review of Systems  Constitutional: Negative.   HENT: Negative.   Respiratory: Positive for shortness of breath. Negative for cough, chest tightness and wheezing.   Cardiovascular: Negative.     Physical Exam  BP 110/60 (BP Location: Left Arm, Cuff Size: Normal)   Pulse (!) 105   Ht 5' 11" (1.803 m)   Wt 166 lb 3.2 oz (75.4 kg)   SpO2 95%   BMI 23.18 kg/m  Physical Exam  Constitutional: He is oriented to person, place, and time. He appears well-developed and well-nourished. No  distress.  HENT:  Head: Normocephalic and atraumatic.  Neck: Normal range of motion. Neck supple.  Cardiovascular: Normal rate and regular rhythm.  Pulmonary/Chest: Effort normal. No respiratory distress. He has no wheezes.  LS clear, diminished t/o. O2 2L 95%  Musculoskeletal: Normal range of motion.  Neurological: He is alert and oriented to person, place, and time.  Skin: Skin is warm and dry.  Psychiatric: His behavior is normal. Judgment and thought content normal.  Some anxiety      Lab Results:  CBC    Component Value Date/Time   WBC 6.2 10/01/2017 0302   RBC 4.07 (L) 10/01/2017 0302   HGB 12.4 (L) 10/01/2017 0302   HGB 15.5 11/13/2011 1328   HCT 38.0 (L) 10/01/2017 0302   HCT 45.4 11/13/2011 1328   PLT 147 (L) 10/01/2017 0302   PLT 122 (L) 11/13/2011 1328  MCV 93.4 10/01/2017 0302   MCV 103.8 (H) 11/13/2011 1328   MCH 30.5 10/01/2017 0302   MCHC 32.6 10/01/2017 0302   RDW 12.4 10/01/2017 0302   RDW 15.8 (H) 11/13/2011 1328   LYMPHSABS 1.2 09/28/2017 1122   LYMPHSABS 1.9 11/13/2011 1328   MONOABS 0.5 09/28/2017 1122   MONOABS 0.9 11/13/2011 1328   EOSABS 0.1 09/28/2017 1122   EOSABS 0.1 11/13/2011 1328   BASOSABS 0.0 09/28/2017 1122   BASOSABS 0.0 11/13/2011 1328    BMET    Component Value Date/Time   NA 138 10/01/2017 0302   NA 136 05/12/2012 1018   K 4.0 10/01/2017 0302   K 4.6 05/12/2012 1018   CL 98 10/01/2017 0302   CL 100 05/12/2012 1018   CO2 32 10/01/2017 0302   CO2 27 05/12/2012 1018   GLUCOSE 225 (H) 10/01/2017 0302   GLUCOSE 107 (H) 05/12/2012 1018   BUN 14 10/01/2017 0302   BUN 11.1 05/12/2012 1018   CREATININE 0.61 10/01/2017 0302   CREATININE 1.0 05/12/2012 1018   CALCIUM 7.8 (L) 10/01/2017 0302   CALCIUM 9.0 05/12/2012 1018   GFRNONAA >60 10/01/2017 0302   GFRAA >60 10/01/2017 0302    BNP    Component Value Date/Time   BNP 94.6 09/10/2017 2215    ProBNP    Component Value Date/Time   PROBNP 498.0 (H) 01/15/2013 0340     Imaging: Dg Chest 2 View  Result Date: 09/22/2017 CLINICAL DATA:  Patient with shortness of breath. EXAM: CHEST - 2 VIEW COMPARISON:  Chest radiograph 09/21/2017 FINDINGS: Monitoring leads overlie the patient. Normal cardiac and mediastinal contours. Pulmonary hyperinflation. No pleural effusion or pneumothorax. Thoracic spine degenerative changes. Old right rib fractures. IMPRESSION: Pulmonary hyperinflation.  No acute cardiopulmonary process. Electronically Signed   By: Lovey Newcomer M.D.   On: 09/22/2017 15:50   Dg Chest Port 1 View  Result Date: 09/28/2017 CLINICAL DATA:  Shortness of breath. EXAM: PORTABLE CHEST 1 VIEW COMPARISON:  September 26, 2017. FINDINGS: Hyperinflation of the lungs. Diaphragm flattening. The heart, hila, mediastinum, lungs, and pleura are otherwise unremarkable. IMPRESSION: Hyperinflation of the lungs suggesting COPD or emphysema. No other acute abnormalities. Electronically Signed   By: Dorise Bullion III M.D   On: 09/28/2017 11:13   Dg Chest Port 1 View  Result Date: 09/26/2017 CLINICAL DATA:  One month history of heartburn and shortness of breath with increasing severity over the past week. History of COPD, current smoker. The patient porch being on home oxygen at home and is on prednisone. History of colonic and prostate malignancy. EXAM: PORTABLE CHEST 1 VIEW COMPARISON:  PA and lateral chest x-ray of September 22, 2017 FINDINGS: The lungs are hyperinflated with hemidiaphragm flattening. There is no focal infiltrate. There is no pleural effusion. The heart and pulmonary vascularity are normal. The mediastinum is normal in width. There are old deformities of the right third through fifth ribs posterolaterally. IMPRESSION: COPD. No pneumonia, CHF, nor other acute cardiopulmonary abnormality. Electronically Signed   By: David  Martinique M.D.   On: 09/26/2017 12:48     Assessment & Plan:   COPD  GOLD III Three hospital admissions for COPD exacerbation. Not taking dulera  or spirivia currently. Can't afford medication. Changed maintenance inhaler to Trelegy to enhance compliance and given patient assistance   Plan A= Automatic. COPD maintenance therapy Trelegy 1 puff daily  Plan B = Back up Albuterol rescue inhaler 2 puffs every 4-6 hrs for shortness of breath Plan C =  Crisis If rescue inhaler does not relieve symptoms start using Albuterol nebulizer every 4-6 hours for sob/wheezing  Continue wearing oxygen 2 L   Follow-up: 4 weeks with Dr. 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

## 2017-10-20 NOTE — Congregational Nurse Program (Signed)
Congregational Nurse Program Note  Date of Encounter: 10/20/2017  Visit to see client at 1700 today at [facility] [address] along with SW Santiago Glad.  Client up and about in room wearin O2 at 2l/min per last order 10/17/17. Color good, slightly SOB with moving about and talking a lot. B/P 124/80 P- 94-R-22, lungs with diminished breath sounds. Client reports using albuterol inhalaer every 6 hours.   Client out of several medications and states he can't get them until Thursday when he gets his check.  Out of abilify, Spiriva, Buspar, Lexapro, Lovastatin, Metformin, Folic Acid and Thiamine and dulera. He does have Trelegy, Flomax and Albuterol. SW to check and see if client can get assistance through Medication Assistance Program. Client has upcoming appt. With primary care Altamease Oiler on Monday and states that he plans to use Medicaid transportation which has now been approved. Nurse discussed importance of client getting medications and he states that he may be able to borrow money from a friend on tomorrow to get meds. Discussed Diabetes diagnosis and diet as well as obtaining a glucometer to monitor blood sugar. SW will assist with getting one. Talked about diet and decreasing amount of carbs.  Client understands and states that he has read through information given to him from hospital. Housing: SW went through a list of places that client might be able to get in with criminal history.  Nurse again asked client if he would consider a LTC facility, client wants nurse to check to see if he would be able to come and go and he may consider.  Client also wanted nurse to check with [employer] program regarding the possibility of him paying a portion of the cost for him to stay longer at the motel until he could secure housing.  Nurse will check on this. Plan:  Follow up on housing, visit again next Monday, follow  Up with client re: meds.  Janin Kozlowski D. Joneen Caraway MSN, Advice worker Nurse Program [phone]  Past Medical  History: Past Medical History:  Diagnosis Date  . Asthma   . Bipolar 1 disorder ([place]) 02/05/2012  . Colon cancer ([place]) 04/11/11   adenocarcinoma of colon, 7/19 nodes pos.FINISHED CHEMO/DR. SHERRILL  . COPD (chronic obstructive pulmonary disease) ([place])    SMOKER  . Depression   . Emphysema of lung ([place])   . Full dentures   . Hemorrhoids   . On home oxygen therapy    "3L; 24/7" (05/29/2017)  . Pneumonia ~ 2016   "double pneumonia"  . Prostate cancer ([place])    Gleason score = 7, supposed to have radiation therapy but he has not followed up (05/29/2017)  . Rib fractures    hx of    Encounter Details: CNP Questionnaire - 10/20/17 1700      Questionnaire   Patient Status  Not Applicable    Race  White or Caucasian    Location Patient Served At  [facility] patient    Insurance  Medicaid    Uninsured  Not Applicable    Food  No food insecurities    Housing/Utilities  No permanent housing    Transportation  No transportation needs    Interpersonal Safety  Yes, feel physically and emotionally safe where you currently live    Medication  Yes, have medication insecurities    Medical Provider  Yes    Referrals  Dental    ED Visit Averted  Not Applicable    Life-Saving Intervention Made  Not Applicable

## 2017-10-21 ENCOUNTER — Encounter: Payer: Self-pay | Admitting: Primary Care

## 2017-10-21 ENCOUNTER — Telehealth: Payer: Self-pay | Admitting: Internal Medicine

## 2017-10-21 NOTE — Telephone Encounter (Signed)
Called Claiborne Billings, unable to reach left message to give [place] a call back.   MW please advise, Claiborne Billings is wanting verbal orders to perform in home rehab on patient.

## 2017-10-21 NOTE — Assessment & Plan Note (Signed)
A1C 8.4 Instructed to take Metformin 500mg  twice a day  Monitor your dietary sugar intake

## 2017-10-21 NOTE — Telephone Encounter (Signed)
Spoke with Claiborne Billings with [facility]. Verbal order has been given. Nothing further was needed.

## 2017-10-21 NOTE — Assessment & Plan Note (Signed)
Continue Ability 5mg  daily Lexapro increase recently to 20mg  daily Added/New Buspar 5mg  daily

## 2017-10-21 NOTE — Assessment & Plan Note (Addendum)
Three hospital admissions for COPD exacerbation. Not taking dulera or spirivia currently. Can't afford medication. Changed maintenance inhaler to Trelegy to enhance compliance and given patient assistance   Plan A= Automatic. COPD maintenance therapy Trelegy 1 puff daily  Plan B = Back up Albuterol rescue inhaler 2 puffs every 4-6 hrs for shortness of breath Plan C = Crisis If rescue inhaler does not relieve symptoms start using Albuterol nebulizer every 4-6 hours for sob/wheezing  Continue wearing oxygen 2 L   Follow-up: 4 weeks with Dr. Melvyn Novas

## 2017-10-23 NOTE — Progress Notes (Signed)
Chart and office note reviewed in detail  > agree with a/p as outlined    

## 2017-10-24 ENCOUNTER — Observation Stay (HOSPITAL_COMMUNITY)
Admission: EM | Admit: 2017-10-24 | Discharge: 2017-10-25 | Disposition: A | Discharge status: Home / Self Care | Payer: Medicaid Other | Attending: Internal Medicine | Admitting: Internal Medicine

## 2017-10-24 ENCOUNTER — Other Ambulatory Visit: Payer: Self-pay

## 2017-10-24 ENCOUNTER — Telehealth: Payer: Self-pay | Admitting: Internal Medicine

## 2017-10-24 ENCOUNTER — Encounter (HOSPITAL_COMMUNITY): Payer: Self-pay | Admitting: Emergency Medicine

## 2017-10-24 ENCOUNTER — Emergency Department (HOSPITAL_COMMUNITY): Payer: Medicaid Other

## 2017-10-24 DIAGNOSIS — Z85038 Personal history of other malignant neoplasm of large intestine: Secondary | ICD-10-CM | POA: Insufficient documentation

## 2017-10-24 DIAGNOSIS — Z79899 Other long term (current) drug therapy: Secondary | ICD-10-CM | POA: Diagnosis not present

## 2017-10-24 DIAGNOSIS — Z7984 Long term (current) use of oral hypoglycemic drugs: Secondary | ICD-10-CM | POA: Diagnosis not present

## 2017-10-24 DIAGNOSIS — E119 Type 2 diabetes mellitus without complications: Secondary | ICD-10-CM | POA: Diagnosis not present

## 2017-10-24 DIAGNOSIS — Z9981 Dependence on supplemental oxygen: Secondary | ICD-10-CM | POA: Insufficient documentation

## 2017-10-24 DIAGNOSIS — J441 Chronic obstructive pulmonary disease with (acute) exacerbation: Principal | ICD-10-CM | POA: Insufficient documentation

## 2017-10-24 DIAGNOSIS — F419 Anxiety disorder, unspecified: Secondary | ICD-10-CM | POA: Insufficient documentation

## 2017-10-24 DIAGNOSIS — Z8249 Family history of ischemic heart disease and other diseases of the circulatory system: Secondary | ICD-10-CM | POA: Insufficient documentation

## 2017-10-24 DIAGNOSIS — D696 Thrombocytopenia, unspecified: Secondary | ICD-10-CM | POA: Diagnosis not present

## 2017-10-24 DIAGNOSIS — Z8546 Personal history of malignant neoplasm of prostate: Secondary | ICD-10-CM | POA: Insufficient documentation

## 2017-10-24 DIAGNOSIS — Z933 Colostomy status: Secondary | ICD-10-CM | POA: Insufficient documentation

## 2017-10-24 DIAGNOSIS — Z9049 Acquired absence of other specified parts of digestive tract: Secondary | ICD-10-CM | POA: Insufficient documentation

## 2017-10-24 DIAGNOSIS — E785 Hyperlipidemia, unspecified: Secondary | ICD-10-CM | POA: Insufficient documentation

## 2017-10-24 DIAGNOSIS — Z801 Family history of malignant neoplasm of trachea, bronchus and lung: Secondary | ICD-10-CM | POA: Insufficient documentation

## 2017-10-24 DIAGNOSIS — Z885 Allergy status to narcotic agent status: Secondary | ICD-10-CM | POA: Insufficient documentation

## 2017-10-24 DIAGNOSIS — Z7951 Long term (current) use of inhaled steroids: Secondary | ICD-10-CM | POA: Insufficient documentation

## 2017-10-24 DIAGNOSIS — Z87891 Personal history of nicotine dependence: Secondary | ICD-10-CM | POA: Insufficient documentation

## 2017-10-24 DIAGNOSIS — F319 Bipolar disorder, unspecified: Secondary | ICD-10-CM | POA: Insufficient documentation

## 2017-10-24 LAB — I-STAT TROPONIN, ED
Troponin i, poc: 0 ng/mL (ref 0.00–0.08)
Troponin i, poc: 0 ng/mL (ref 0.00–0.08)

## 2017-10-24 LAB — CBC WITH DIFFERENTIAL/PLATELET
BASOS PCT: 1 %
Basophils Absolute: 0.1 10*3/uL (ref 0.0–0.1)
EOS PCT: 1 %
Eosinophils Absolute: 0.1 10*3/uL (ref 0.0–0.7)
HEMATOCRIT: 38 % — AB (ref 39.0–52.0)
Hemoglobin: 12.2 g/dL — ABNORMAL LOW (ref 13.0–17.0)
Lymphocytes Relative: 8 %
Lymphs Abs: 0.7 10*3/uL (ref 0.7–4.0)
MCH: 29.9 pg (ref 26.0–34.0)
MCHC: 32.1 g/dL (ref 30.0–36.0)
MCV: 93.1 fL (ref 78.0–100.0)
Monocytes Absolute: 0.3 10*3/uL (ref 0.1–1.0)
Monocytes Relative: 4 %
NEUTROS ABS: 7 10*3/uL (ref 1.7–7.7)
Neutrophils Relative %: 86 %
Platelets: 292 10*3/uL (ref 150–400)
RBC: 4.08 MIL/uL — ABNORMAL LOW (ref 4.22–5.81)
RDW: 12.8 % (ref 11.5–15.5)
WBC: 8.2 10*3/uL (ref 4.0–10.5)

## 2017-10-24 LAB — COMPREHENSIVE METABOLIC PANEL
ALBUMIN: 3.5 g/dL (ref 3.5–5.0)
ALT: 14 U/L (ref 0–44)
AST: 15 U/L (ref 15–41)
Alkaline Phosphatase: 63 U/L (ref 38–126)
Anion gap: 9 (ref 5–15)
BUN: 6 mg/dL — ABNORMAL LOW (ref 8–23)
CHLORIDE: 98 mmol/L (ref 98–111)
CO2: 31 mmol/L (ref 22–32)
Calcium: 8.9 mg/dL (ref 8.9–10.3)
Creatinine, Ser: 0.68 mg/dL (ref 0.61–1.24)
GFR calc Af Amer: >60 mL/min (ref >60)
GFR calc non Af Amer: >60 mL/min (ref >60)
GLUCOSE: 154 mg/dL — AB (ref 70–99)
Potassium: 4.2 mmol/L (ref 3.5–5.1)
Sodium: 138 mmol/L (ref 135–145)
Total Bilirubin: 0.4 mg/dL (ref 0.3–1.2)
Total Protein: 5.9 g/dL — ABNORMAL LOW (ref 6.5–8.1)

## 2017-10-24 MED ORDER — TAMSULOSIN HCL 0.4 MG PO CAPS
0.4000 mg | ORAL_CAPSULE | Freq: Every day | ORAL | Status: DC
  Administered 2017-10-25: 0.4 mg via ORAL
  Filled 2017-10-24: qty 1

## 2017-10-24 MED ORDER — FOLIC ACID 1 MG PO TABS
1.0000 mg | ORAL_TABLET | Freq: Every day | ORAL | Status: DC
  Administered 2017-10-25: 1 mg via ORAL
  Filled 2017-10-24: qty 1

## 2017-10-24 MED ORDER — IPRATROPIUM-ALBUTEROL 0.5-2.5 (3) MG/3ML IN SOLN
3.0000 mL | Freq: Once | RESPIRATORY_TRACT | Status: AC
  Administered 2017-10-24: 3 mL via RESPIRATORY_TRACT
  Filled 2017-10-24: qty 3

## 2017-10-24 MED ORDER — ACETAMINOPHEN 325 MG PO TABS: 650.0000 mg | ORAL_TABLET | Freq: Four times a day (QID) | ORAL | Status: DC | PRN

## 2017-10-24 MED ORDER — FLUTICASONE-UMECLIDIN-VILANT 100-62.5-25 MCG/INH IN AEPB: 1.0000 | INHALATION_SPRAY | Freq: Every day | RESPIRATORY_TRACT | Status: DC

## 2017-10-24 MED ORDER — IPRATROPIUM-ALBUTEROL 0.5-2.5 (3) MG/3ML IN SOLN: 3.0000 mL | Freq: Four times a day (QID) | RESPIRATORY_TRACT | Status: DC | PRN

## 2017-10-24 MED ORDER — ALBUTEROL SULFATE (5 MG/ML) 0.5% IN NEBU: 5.0000 mg | INHALATION_SOLUTION | Freq: Four times a day (QID) | RESPIRATORY_TRACT | 3 refills | Status: DC | PRN

## 2017-10-24 MED ORDER — PREDNISONE 20 MG PO TABS
40.0000 mg | ORAL_TABLET | Freq: Every day | ORAL | Status: DC
  Administered 2017-10-25: 40 mg via ORAL
  Filled 2017-10-24: qty 2

## 2017-10-24 MED ORDER — GUAIFENESIN ER 600 MG PO TB12: 600.0000 mg | ORAL_TABLET | Freq: Two times a day (BID) | ORAL | 0 refills | Status: DC

## 2017-10-24 MED ORDER — ESCITALOPRAM OXALATE 10 MG PO TABS
20.0000 mg | ORAL_TABLET | Freq: Every day | ORAL | Status: DC
  Administered 2017-10-25: 20 mg via ORAL
  Filled 2017-10-24: qty 2

## 2017-10-24 MED ORDER — ACETAMINOPHEN 650 MG RE SUPP: 650.0000 mg | Freq: Four times a day (QID) | RECTAL | Status: DC | PRN

## 2017-10-24 MED ORDER — ENOXAPARIN SODIUM 40 MG/0.4ML ~~LOC~~ SOLN
40.0000 mg | SUBCUTANEOUS | Status: DC
  Administered 2017-10-25: 40 mg via SUBCUTANEOUS
  Filled 2017-10-24: qty 0.4

## 2017-10-24 MED ORDER — VITAMIN B-1 100 MG PO TABS
100.0000 mg | ORAL_TABLET | Freq: Every day | ORAL | Status: DC
  Administered 2017-10-25: 100 mg via ORAL
  Filled 2017-10-24: qty 1

## 2017-10-24 MED ORDER — ARIPIPRAZOLE 5 MG PO TABS
5.0000 mg | ORAL_TABLET | Freq: Every day | ORAL | Status: DC
  Administered 2017-10-25: 5 mg via ORAL
  Filled 2017-10-24: qty 1

## 2017-10-24 MED ORDER — INSULIN ASPART 100 UNIT/ML ~~LOC~~ SOLN
0.0000 [IU] | Freq: Three times a day (TID) | SUBCUTANEOUS | Status: DC
  Administered 2017-10-25: 5 [IU] via SUBCUTANEOUS
  Administered 2017-10-25: 2 [IU] via SUBCUTANEOUS

## 2017-10-24 MED ORDER — ALBUTEROL SULFATE (2.5 MG/3ML) 0.083% IN NEBU: 2.5000 mg | INHALATION_SOLUTION | RESPIRATORY_TRACT | Status: DC | PRN

## 2017-10-24 MED ORDER — BUDESONIDE 0.25 MG/2ML IN SUSP
0.2500 mg | Freq: Two times a day (BID) | RESPIRATORY_TRACT | Status: DC
  Administered 2017-10-25: 0.25 mg via RESPIRATORY_TRACT
  Filled 2017-10-24: qty 2

## 2017-10-24 MED ORDER — FLUTICASONE FUROATE-VILANTEROL 100-25 MCG/INH IN AEPB
1.0000 | INHALATION_SPRAY | Freq: Every day | RESPIRATORY_TRACT | Status: DC
  Administered 2017-10-25: 1 via RESPIRATORY_TRACT
  Filled 2017-10-24: qty 28

## 2017-10-24 NOTE — Telephone Encounter (Signed)
Called Donita who was currently with pt and stated pt is having more difficulty breathing which became worse yesterday, 10/3. BP is normal at 110/70, P is 81 and irregular, respirations 22, Temp was 96.6, and pt's O2 sats are 96% on 3L O2  Pt has had more chest pressure.  Pt does not have albuterol neb sol, does not have lovastatin, mucinex, or buspar as these meds were not at the pharmacy. Pt does have prednisone at the home but per Donita, med is not listed on pt's medication list. I sent refill of albuterol neb sol and mucinex refill to pt's pharmacy.  Advised Donita if pt is having a lot of difficulty breathing, he should go to ER to be evaluated. Donita expressed understanding and stated she would get things taken care of for pt and get him the help that he needs at the ER.

## 2017-10-24 NOTE — ED Triage Notes (Addendum)
Pt from home- having SOB starting a few days ago. EMS dueoneb, solumedrol. Wheezing in all fields has now cleared. Pt reporting chest pressure worse when he exerted himself. 3l [place] at home

## 2017-10-24 NOTE — ED Notes (Signed)
ED Provider at bedside. 

## 2017-10-24 NOTE — H&P (Signed)
History and Physical    Taylor [name] ZJQ:[id number] DOB: May 17, 1955 DOA: 10/24/2017  PCP: [facility]  Patient coming from: [facility] via [facility] assistance  I have personally briefly reviewed patient's old medical records in [place]  Chief Complaint: shortness of breath  HPI: Taylor [name] is a 62 y.o. male with medical history significant for COPD, diabetes, colon cancer status post sigmoid colectomy and colostomy revision, bipolar disorder, anxiety, hyperlipidemia, prostate cancer who presents to the ED after several days of worsening shortness of breath.  He says he has been using his inhalers without significant relief.  He uses supplemental O2 3 L via nasal cannula.  Due to minimal improvement he called EMS who administered Solu-Medrol 25 mg once and a DuoNeb treatment.  He had some improvement but continued to be short of breath.  He denied any cough or sputum production, fevers, chest pain, nausea, vomiting, abdominal pain, diarrhea, constipation.  ED Course:  Initial vitals BP 128/81, pulse 80, RR 22, temp 97.31F, SPO2 100% on 3L O2 via [place].  Lab work was unremarkable.  Patient was given 2 DuoNeb treatments.  He had some further improvement in his breathing and was oxygenating well at rest.  However on ambulation he desatted into the 80s while on O2 3 L via nasal cannula.  Chest x-ray showed hyperinflated lung fields without infiltrate.  Hospitalist was therefore consulted to admit for COPD exacerbation.   Review of Systems: As per HPI otherwise 10 point review of systems negative.    Past Medical History:  Diagnosis Date  . Asthma   . Bipolar 1 disorder ([place]) 02/05/2012  . Colon cancer ([place]) 04/11/11   adenocarcinoma of colon, 7/19 nodes pos.FINISHED CHEMO/DR. SHERRILL  . COPD (chronic obstructive pulmonary disease) ([place])    SMOKER  . Depression   . Emphysema of lung ([place])   . Full dentures   . Hemorrhoids   . On home oxygen therapy    "3L; 24/7" (05/29/2017)  . Pneumonia ~ 2016   "double pneumonia"  . Prostate cancer ([place])    Gleason score = 7, supposed to have radiation therapy but he has not followed up (05/29/2017)  . Rib fractures    hx of    Past Surgical History:  Procedure Laterality Date  . COLON SURGERY  04/11/11   Sigmoid colectomy  . COLOSTOMY REVISION  04/11/2011   Procedure: COLON RESECTION SIGMOID;  Surgeon: [name], MD;  Location: [facility];  Service: General;  Laterality: N/A;  low anterior colon resection   . HERNIA REPAIR    . INGUINAL HERNIA REPAIR Right 05/01/2012   Procedure: HERNIA REPAIR INGUINAL ADULT;  Surgeon: [name], MD;  Location: [facility];  Service: General;  Laterality: Right;  . INSERTION OF MESH Right 05/01/2012   Procedure: INSERTION OF MESH;  Surgeon: [name], MD;  Location: [facility];  Service: General;  Laterality: Right;  . PORT-A-CATH REMOVAL Left 05/01/2012   Procedure: REMOVAL Infusion Port;  Surgeon: [name], MD;  Location: [facility];  Service: General;  Laterality: Left;  . PORTACATH PLACEMENT  05/02/2011   Procedure: INSERTION PORT-A-CATH;  Surgeon: [name], MD;  Location: [facility];  Service: General;  Laterality: N/A;  . PROSTATE BIOPSY       reports that he quit smoking about 5 months ago. His smoking use included cigarettes. He has a 4.60 pack-year smoking history. He has quit using smokeless tobacco.  His smokeless tobacco  use included chew. He reports that he drank alcohol. He reports that he does not use drugs.  Allergies  Allergen Reactions  . Codeine Nausea And Vomiting    Family History  Problem Relation Age of Onset  . Heart disease Father   . Lung cancer Maternal Uncle        smoked     Prior to Admission medications   Medication Sig Start Date End Date Taking? Authorizing Provider  albuterol (PROVENTIL HFA;VENTOLIN HFA) 108 (90 Base) MCG/ACT inhaler Inhale 2 puffs into the lungs every 6 (six) hours as needed for wheezing or shortness of  breath. 10/14/17  Yes Varney Biles, MD  albuterol (PROVENTIL) (5 MG/ML) 0.5% nebulizer solution Take 1 mL (5 mg total) by nebulization every 6 (six) hours as needed for wheezing or shortness of breath. 10/24/17  Yes [name], MD  ARIPiprazole (ABILIFY) 5 MG tablet Take 1 tablet (5 mg total) by mouth daily with breakfast. 10/14/17  Yes Nanavati, Ankit, MD  escitalopram (LEXAPRO) 20 MG tablet Take 1 tablet (20 mg total) by mouth daily. 10/14/17  Yes Varney Biles, MD  Fluticasone-Umeclidin-Vilant (TRELEGY ELLIPTA) 100-62.5-25 MCG/INH AEPB Inhale 1 puff into the lungs daily. 10/17/17  Yes Martyn Ehrich, NP  folic acid (FOLVITE) 1 MG tablet Take 1 tablet (1 mg total) by mouth daily. 10/14/17  Yes Varney Biles, MD  lovastatin (MEVACOR) 20 MG tablet Take 1 tablet (20 mg total) by mouth at bedtime. 05/18/17  Yes Nita Sells, MD  metFORMIN (GLUCOPHAGE) 500 MG tablet Take 1 tablet (500 mg total) by mouth 2 (two) times daily with a meal. 10/14/17  Yes Nanavati, Ankit, MD  mometasone-formoterol (DULERA) 100-5 MCG/ACT AERO Inhale 2 puffs into the lungs 2 (two) times daily. 10/14/17  Yes Nanavati, Ankit, MD  OXYGEN Inhale 3 L into the lungs continuous.    Yes [provider]  predniSONE (DELTASONE) 10 MG tablet Take 10 mg by mouth daily with breakfast.   Yes [provider]  tamsulosin (FLOMAX) 0.4 MG CAPS capsule Take 1 capsule (0.4 mg total) by mouth daily. 10/14/17  Yes Varney Biles, MD  thiamine 100 MG tablet Take 1 tablet (100 mg total) by mouth daily. 10/14/17  Yes Varney Biles, MD  blood glucose meter kit and supplies KIT Dispense based on patient and insurance preference. Use up to four times daily as directed. (FOR ICD-9 250.00, 250.01). Patient not taking: Reported on 10/17/2017 09/07/17   Edwin Dada, MD  busPIRone (BUSPAR) 5 MG tablet Take 1 tablet (5 mg total) by mouth 3 (three) times daily. Patient not taking: Reported on 10/17/2017 10/02/17   Lavina Hamman, MD  Fluticasone-Umeclidin-Vilant (TRELEGY ELLIPTA) 100-62.5-25 MCG/INH AEPB Inhale 1 puff into the lungs daily. Patient not taking: Reported on 10/24/2017 10/17/17   Martyn Ehrich, NP  guaiFENesin (MUCINEX) 600 MG 12 hr tablet Take 1 tablet (600 mg total) by mouth 2 (two) times daily. Patient not taking: Reported on 10/24/2017 10/24/17   [name], MD  nicotine (NICODERM CQ - DOSED IN MG/24 HOURS) 21 mg/24hr patch Place 1 patch (21 mg total) onto the skin daily. Patient not taking: Reported on 10/24/2017 09/26/17   Reyne Dumas, MD  tiotropium (SPIRIVA HANDIHALER) 18 MCG inhalation capsule Place 1 capsule (18 mcg total) into inhaler and inhale daily. Patient not taking: Reported on 10/24/2017 08/29/17   Merrily Pew, MD    Physical Exam: Vitals:   10/24/17 2200 10/24/17 2230 10/24/17 2300 10/24/17 2336  BP: 132/89 Marland Kitchen)  149/81 123/87 107/65  Pulse: (!) 129 (!) 107 (!) 103 89  Resp: (!) 24 (!) 24 20   Temp:    98.2 F (36.8 C)  TempSrc:    Oral  SpO2: 98% 100% 100% 95%  Height:    '5\' 11"'  (1.803 m)    Constitutional: NAD, calm, comfortable Vitals:   10/24/17 2200 10/24/17 2230 10/24/17 2300 10/24/17 2336  BP: 132/89 (!) 149/81 123/87 107/65  Pulse: (!) 129 (!) 107 (!) 103 89  Resp: (!) 24 (!) 24 20   Temp:    98.2 F (36.8 C)  TempSrc:    Oral  SpO2: 98% 100% 100% 95%  Height:    '5\' 11"'  (1.803 m)   Eyes: PERRL, lids and conjunctivae normal ENMT: Mucous membranes are moist. Posterior pharynx clear of any exudate or lesions.Normal dentition.  Neck: normal, supple, no masses, no thyromegaly Respiratory: Distant breath sounds, faint wheezing bilaterally.  Normal respiratory effort. No accessory muscle use.  Cardiovascular: Slight tachycardia, no murmurs / rubs / gallops. No extremity edema..  Abdomen: no tenderness, no masses palpated. No hepatosplenomegaly. Bowel sounds positive.  Musculoskeletal: no clubbing / cyanosis. No joint deformity upper and lower extremities.  Good ROM, no contractures. Normal muscle tone.  Skin: no rashes, lesions, ulcers. No induration Neurologic: CN 2-12 grossly intact. Sensation intact. Strength 5/5 in all 4.  Psychiatric: Normal judgment and insight. Alert and oriented x 3. Normal mood.   Labs on Admission: I have personally reviewed following labs and imaging studies  CBC: Recent Labs  Lab 10/24/17 1614  WBC 8.2  NEUTROABS 7.0  HGB 12.2*  HCT 38.0*  MCV 93.1  PLT 627   Basic Metabolic Panel: Recent Labs  Lab 10/24/17 1614  NA 138  K 4.2  CL 98  CO2 31  GLUCOSE 154*  BUN 6*  CREATININE 0.68  CALCIUM 8.9   GFR: Estimated Creatinine Clearance: 102 mL/min (by C-G formula based on SCr of 0.68 mg/dL). Liver Function Tests: Recent Labs  Lab 10/24/17 1614  AST 15  ALT 14  ALKPHOS 63  BILITOT 0.4  PROT 5.9*  ALBUMIN 3.5   No results for input(s): LIPASE, AMYLASE in the last 168 hours. No results for input(s): AMMONIA in the last 168 hours. Coagulation Profile: No results for input(s): INR, PROTIME in the last 168 hours. Cardiac Enzymes: No results for input(s): CKTOTAL, CKMB, CKMBINDEX, TROPONINI in the last 168 hours. BNP (last 3 results) No results for input(s): PROBNP in the last 8760 hours. HbA1C: No results for input(s): HGBA1C in the last 72 hours. CBG: No results for input(s): GLUCAP in the last 168 hours. Lipid Profile: No results for input(s): CHOL, HDL, LDLCALC, TRIG, CHOLHDL, LDLDIRECT in the last 72 hours. Thyroid Function Tests: No results for input(s): TSH, T4TOTAL, FREET4, T3FREE, THYROIDAB in the last 72 hours. Anemia Panel: No results for input(s): VITAMINB12, FOLATE, FERRITIN, TIBC, IRON, RETICCTPCT in the last 72 hours. Urine analysis:    Component Value Date/Time   COLORURINE YELLOW 06/13/2017 0203   APPEARANCEUR CLEAR 06/13/2017 0203   LABSPEC 1.018 06/13/2017 0203   PHURINE 6.0 06/13/2017 0203   GLUCOSEU 50 (A) 06/13/2017 0203   HGBUR NEGATIVE 06/13/2017 0203    BILIRUBINUR NEGATIVE 06/13/2017 0203   KETONESUR NEGATIVE 06/13/2017 0203   PROTEINUR NEGATIVE 06/13/2017 0203   UROBILINOGEN 1.0 01/09/2013 1843   NITRITE NEGATIVE 06/13/2017 0203   LEUKOCYTESUR NEGATIVE 06/13/2017 0203    Radiological Exams on Admission: Dg Chest 2 View  Result Date: 10/24/2017  CLINICAL DATA:  Shortness of breath for few days. Chest pressure with exertion. History of emphysema. EXAM: CHEST - 2 VIEW COMPARISON:  Chest radiograph September 28, 2017. FINDINGS: Cardiomediastinal silhouette is normal. No pleural effusions or focal consolidations. Trachea projects midline and there is no pneumothorax. Soft tissue planes and included osseous structures are non-suspicious. Old RIGHT rib fractures. IMPRESSION: Similar hyperinflation without focal consolidation. Electronically Signed   By: Elon Alas M.D.   On: 10/24/2017 15:37    EKG: Independently reviewed.  Sinus rhythm, no significant change from prior.  Assessment/Plan Principal Problem:   COPD exacerbation ([place]) Active Problems:   Bipolar 1 disorder ([place])   HLD (hyperlipidemia)   Type 2 diabetes mellitus ([place])  Taylor [name] is a 62 y.o. male with medical history significant for COPD, diabetes, colon cancer status post sigmoid colectomy and colostomy revision, bipolar disorder, anxiety, hyperlipidemia, prostate cancer who presents to the ED after several days of worsening shortness of breath and admitted for COPD exacerbation.  COPD exacerbation: Improving after initial treatment.  Was oxygenating well on room air during my exam.  He denies cough or productive sputum so we will hold off on antibiotics for now. -DuoNeb's, albuterol nebs, prednisone 40 mg p.o. daily x4 more days -Reassess O2 sats with ambulation tomorrow  Bipolar disorder/anxiety: Continue home Abilify 5 mg daily, Lexapro 20 mg daily  Type 2 diabetes: Last A1c 8.2% on 09/28/2017.  Takes metformin outpatient. -SSI  Prostate cancer: Gleason  score 7, unclear if followed up for further management.  Continue home Flomax.   DVT prophylaxis: Lovenox  Code Status: Full Family Communication: None present at bedside  Disposition Plan: Likely discharge back to motel pending oxygenation status on ambulation Consults called: None Admission status: Inpatient, medical floor   Zada Finders MD [facility] Pager [phone]  If 7PM-7AM, please contact night-coverage [email or web address] Password [email or web address]  10/25/2017, 12:34 AM

## 2017-10-24 NOTE — ED Notes (Signed)
Pt ambulated on his baseline 3L of 02.  Initial SPo2 was 98% after ambulating 73ft his Spo2 dropped to 86% and pt complained of SOB.  NAD at this time pt in bed Spo2 at 98%

## 2017-10-24 NOTE — ED Notes (Signed)
Re-paged [facility] x3- no return call

## 2017-10-24 NOTE — ED Notes (Addendum)
Pt ambulated from room 48 to room 42.  Pt's O2 sat dropped to 85% on 3L O2.  Upon returning to room pt''s breathing was labored w/ pursed lip breathing and accessory muscle use.  Will inform EDP.
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
## 2017-10-24 NOTE — ED Notes (Signed)
Re-page To [facility]

## 2017-10-24 NOTE — ED Notes (Signed)
Patient transported to X-ray 

## 2017-10-25 DIAGNOSIS — J441 Chronic obstructive pulmonary disease with (acute) exacerbation: Secondary | ICD-10-CM | POA: Diagnosis not present

## 2017-10-25 LAB — GLUCOSE, CAPILLARY
GLUCOSE-CAPILLARY: 283 mg/dL — AB (ref 70–99)
Glucose-Capillary: 172 mg/dL — ABNORMAL HIGH (ref 70–99)

## 2017-10-25 LAB — CBC
HEMATOCRIT: 36.8 % — AB (ref 39.0–52.0)
Hemoglobin: 11.9 g/dL — ABNORMAL LOW (ref 13.0–17.0)
MCH: 29.8 pg (ref 26.0–34.0)
MCHC: 32.3 g/dL (ref 30.0–36.0)
MCV: 92.2 fL (ref 78.0–100.0)
Platelets: 309 10*3/uL (ref 150–400)
RBC: 3.99 MIL/uL — ABNORMAL LOW (ref 4.22–5.81)
RDW: 12.7 % (ref 11.5–15.5)
WBC: 8.5 10*3/uL (ref 4.0–10.5)

## 2017-10-25 LAB — BASIC METABOLIC PANEL
Anion gap: 11 (ref 5–15)
BUN: 12 mg/dL (ref 8–23)
CALCIUM: 8.5 mg/dL — AB (ref 8.9–10.3)
CHLORIDE: 95 mmol/L — AB (ref 98–111)
CO2: 28 mmol/L (ref 22–32)
CREATININE: 0.8 mg/dL (ref 0.61–1.24)
GFR calc Af Amer: >60 mL/min (ref >60)
GFR calc non Af Amer: >60 mL/min (ref >60)
Glucose, Bld: 316 mg/dL — ABNORMAL HIGH (ref 70–99)
Potassium: 4.4 mmol/L (ref 3.5–5.1)
SODIUM: 134 mmol/L — AB (ref 135–145)

## 2017-10-25 MED ORDER — LOVASTATIN 20 MG PO TABS: 20.0000 mg | ORAL_TABLET | Freq: Every day | ORAL | 12 refills | Status: DC

## 2017-10-25 MED ORDER — INSULIN GLARGINE 100 UNIT/ML ~~LOC~~ SOLN
8.0000 [IU] | Freq: Every day | SUBCUTANEOUS | Status: DC
  Administered 2017-10-25: 8 [IU] via SUBCUTANEOUS
  Filled 2017-10-25: qty 0.08

## 2017-10-25 MED ORDER — IPRATROPIUM-ALBUTEROL 0.5-2.5 (3) MG/3ML IN SOLN
3.0000 mL | Freq: Four times a day (QID) | RESPIRATORY_TRACT | Status: DC
  Administered 2017-10-25 (×2): 3 mL via RESPIRATORY_TRACT
  Filled 2017-10-25 (×2): qty 3

## 2017-10-25 MED ORDER — ORAL CARE MOUTH RINSE
15.0000 mL | Freq: Two times a day (BID) | OROMUCOSAL | Status: DC
  Administered 2017-10-25: 15 mL via OROMUCOSAL

## 2017-10-25 MED ORDER — PREDNISONE 20 MG PO TABS: 20.0000 mg | ORAL_TABLET | Freq: Every day | ORAL | 0 refills | Status: DC

## 2017-10-25 MED ORDER — ALBUTEROL SULFATE (5 MG/ML) 0.5% IN NEBU: 5.0000 mg | INHALATION_SOLUTION | Freq: Four times a day (QID) | RESPIRATORY_TRACT | 3 refills | Status: DC | PRN

## 2017-10-25 MED ORDER — PREDNISONE 20 MG PO TABS: 40.0000 mg | ORAL_TABLET | Freq: Every day | ORAL | 0 refills | Status: DC

## 2017-10-25 NOTE — Progress Notes (Signed)
Pt discharged home at this time via PTAR. Discharge instructions provided to Pt. Pt verbalized understanding. VS stable. Pt denied pain. All personal belongings packed and accompanied Pt. Home medications retrieved from pharmacy and returned to Pt.

## 2017-10-25 NOTE — Discharge Instructions (Signed)
Follow with [facility] in 5-7 days for follow up   Please get a complete blood count and chemistry panel checked by your Primary MD at your next visit, and again as instructed by your Primary MD. Please get your medications reviewed and adjusted by your Primary MD.  Please request your Primary MD to go over all Hospital Tests and Procedure/Radiological results at the follow up, please get all Hospital records sent to your Prim MD by signing hospital release before you go home.  If you had Pneumonia of Lung problems at the Hospital: Please get a 2 view Chest X ray done in 6-8 weeks after hospital discharge or sooner if instructed by your Primary MD.  If you have Congestive Heart Failure: Please call your Cardiologist or Primary MD anytime you have any of the following symptoms:  1) 3 pound weight gain in 24 hours or 5 pounds in 1 week  2) shortness of breath, with or without a dry hacking cough  3) swelling in the hands, feet or stomach  4) if you have to sleep on extra pillows at night in order to breathe  Follow cardiac low salt diet and 1.5 lit/day fluid restriction.  If you have diabetes Accuchecks 4 times/day, Once in AM empty stomach and then before each meal. Log in all results and show them to your primary doctor at your next visit. If any glucose reading is under 80 or above 300 call your primary MD immediately.  If you have Seizure/Convulsions/Epilepsy: Please do not drive, operate heavy machinery, participate in activities at heights or participate in high speed sports until you have seen by Primary MD or a Neurologist and advised to do so again.  If you had Gastrointestinal Bleeding: Please ask your Primary MD to check a complete blood count within one week of discharge or at your next visit. Your endoscopic/colonoscopic biopsies that are pending at the time of discharge, will also need to followed by your Primary MD.  Get Medicines reviewed and  adjusted. Please take all your medications with you for your next visit with your Primary MD  Please request your Primary MD to go over all hospital tests and procedure/radiological results at the follow up, please ask your Primary MD to get all Hospital records sent to his/her office.  If you experience worsening of your admission symptoms, develop shortness of breath, life threatening emergency, suicidal or homicidal thoughts you must seek medical attention immediately by calling 911 or calling your MD immediately  if symptoms less severe.  You must read complete instructions/literature along with all the possible adverse reactions/side effects for all the Medicines you take and that have been prescribed to you. Take any new Medicines after you have completely understood and accpet all the possible adverse reactions/side effects.   Do not drive or operate heavy machinery when taking Pain medications.   Do not take more than prescribed Pain, Sleep and Anxiety Medications  Special Instructions: If you have smoked or chewed Tobacco  in the last 2 yrs please stop smoking, stop any regular Alcohol  and or any Recreational drug use.  Wear Seat belts while driving.  Please note You were cared for by a hospitalist during your hospital stay. If you have any questions about your discharge medications or the care you received while you were in the hospital after you are discharged, you can call the unit and asked to speak with the hospitalist on call if the hospitalist that took care  of you is not available. Once you are discharged, your primary care physician will handle any further medical issues. Please note that NO REFILLS for any discharge medications will be authorized once you are discharged, as it is imperative that you return to your primary care physician (or establish a relationship with a primary care physician if you do not have one) for your aftercare needs so that they can reassess your need  for medications and monitor your lab values.  You can reach the hospitalist office at phone [phone] or fax [phone]   If you do not have a primary care physician, you can call [phone] for a physician referral.  Activity: As tolerated with Full fall precautions use walker/cane & assistance as needed  Diet: diabetic  Disposition Home

## 2017-10-25 NOTE — Progress Notes (Signed)
Ambulated Pt approximately 500 feet on 4 liters via [place]. Pt SOB with pursed lip breathing throughout walk. Pt required rest breaks while ambulating. Pt resting comfortably in bed on 4 liters high flow. Will continue to monitor Pt.

## 2017-10-25 NOTE — Progress Notes (Signed)
Printed PTAR forms to [employer] at request of charge nurse Jansen. Patient requires O2 for transport,  Verified with patient that he has concentrator at [facility] through [facility].  Address is [place] [address] PTAR number [phone]

## 2017-10-25 NOTE — Discharge Summary (Signed)
Physician Discharge Summary  OVA Taylor [name]:[id number] DOB: 20-May-1955 DOA: 10/24/2017  PCP: [facility]  Admit date: 10/24/2017 Discharge date: 10/25/2017  Admitted From: home (motel) Disposition:  Home (motel)  Recommendations for Outpatient Follow-up:  1. Follow up with PCP in 1-2 weeks  Home Health: none Equipment/Devices: none  Discharge Condition: stable CODE STATUS: Full code Diet recommendation: regula  HPI: Per Dr. Posey Pronto, Taylor [name] is a 62 y.o. male with medical history significant for COPD, diabetes, colon cancer status post sigmoid colectomy and colostomy revision, bipolar disorder, anxiety, hyperlipidemia, prostate cancer who presents to the ED after several days of worsening shortness of breath.  He says he has been using his inhalers without significant relief.  He uses supplemental O2 3 L via nasal cannula.  Due to minimal improvement he called EMS who administered Solu-Medrol 25 mg once and a DuoNeb treatment.  He had some improvement but continued to be short of breath.  He denied any cough or sputum production, fevers, chest pain, nausea, vomiting, abdominal pain, diarrhea, constipation. ED Course:  Initial vitals BP 128/81, pulse 80, RR 22, temp 97.22F, SPO2 100% on 3L O2 via [place].  Lab work was unremarkable.  Patient was given 2 DuoNeb treatments.  He had some further improvement in his breathing and was oxygenating well at rest.  However on ambulation he desatted into the 80s while on O2 3 L via nasal cannula.  Chest x-ray showed hyperinflated lung fields without infiltrate.  Hospitalist was therefore consulted to admit for COPD exacerbation.  Hospital Course:  Patient was admitted to the hospital with COPD exacerbation, very mild form, he was admitted overnight and by the next morning his wheezing was completely resolved, he was able to ambulate in the hallway with difficulties however at his baseline and patient confirms that he is  back to his normal in terms of his respiratory status.  His albuterol nebulizer prescription was refilled on discharge, and he will be given a quick prednisone taper. For his bipolar disorder, type 2 diabetes, and other problems no medication changes were made   Discharge Diagnoses:  Principal Problem:   COPD exacerbation ([place]) Active Problems:   Bipolar 1 disorder ([place])   HLD (hyperlipidemia)   Type 2 diabetes mellitus ([place])     Discharge Instructions   Allergies as of 10/25/2017      Reactions   Codeine Nausea And Vomiting      Medication List    STOP taking these medications   busPIRone 5 MG tablet Commonly known as:  BUSPAR   guaiFENesin 600 MG 12 hr tablet Commonly known as:  MUCINEX   nicotine 21 mg/24hr patch Commonly known as:  NICODERM CQ - dosed in mg/24 hours   tiotropium 18 MCG inhalation capsule Commonly known as:  SPIRIVA     TAKE these medications   albuterol 108 (90 Base) MCG/ACT inhaler Commonly known as:  PROVENTIL HFA;VENTOLIN HFA Inhale 2 puffs into the lungs every 6 (six) hours as needed for wheezing or shortness of breath.   albuterol (5 MG/ML) 0.5% nebulizer solution Commonly known as:  PROVENTIL Take 1 mL (5 mg total) by nebulization every 6 (six) hours as needed for wheezing or shortness of breath.   ARIPiprazole 5 MG tablet Commonly known as:  ABILIFY Take 1 tablet (5 mg total) by mouth daily with breakfast.   blood glucose meter kit and supplies Kit Dispense based on patient and insurance preference. Use up to four times daily  as directed. (FOR ICD-9 250.00, 250.01).   escitalopram 20 MG tablet Commonly known as:  LEXAPRO Take 1 tablet (20 mg total) by mouth daily.   Fluticasone-Umeclidin-Vilant 100-62.5-25 MCG/INH Aepb Inhale 1 puff into the lungs daily. What changed:  Another medication with the same name was removed. Continue taking this medication, and follow the directions you see here.   folic acid 1 MG tablet Commonly  known as:  FOLVITE Take 1 tablet (1 mg total) by mouth daily.   lovastatin 20 MG tablet Commonly known as:  MEVACOR Take 1 tablet (20 mg total) by mouth at bedtime.   metFORMIN 500 MG tablet Commonly known as:  GLUCOPHAGE Take 1 tablet (500 mg total) by mouth 2 (two) times daily with a meal.   mometasone-formoterol 100-5 MCG/ACT Aero Commonly known as:  DULERA Inhale 2 puffs into the lungs 2 (two) times daily.   OXYGEN Inhale 3 L into the lungs continuous.   predniSONE 10 MG tablet Commonly known as:  DELTASONE Take 10 mg by mouth daily with breakfast. What changed:  Another medication with the same name was added. Make sure you understand how and when to take each.   predniSONE 20 MG tablet Commonly known as:  DELTASONE Take 1 tablet (20 mg total) by mouth daily with breakfast. Take 20 mg for 4 days then return to prior dose Start taking on:  10/26/2017 What changed:  You were already taking a medication with the same name, and this prescription was added. Make sure you understand how and when to take each.   tamsulosin 0.4 MG Caps capsule Commonly known as:  FLOMAX Take 1 capsule (0.4 mg total) by mouth daily.   thiamine 100 MG tablet Take 1 tablet (100 mg total) by mouth daily.      Follow-up [facility]. Schedule an appointment as soon as possible for a visit in 1 week(s).   Contact information: [place] [address] [phone]           Consultations:    Procedures/Studies:  Dg Chest 2 View  Result Date: 10/24/2017 CLINICAL DATA:  Shortness of breath for few days. Chest pressure with exertion. History of emphysema. EXAM: CHEST - 2 VIEW COMPARISON:  Chest radiograph September 28, 2017. FINDINGS: Cardiomediastinal silhouette is normal. No pleural effusions or focal consolidations. Trachea projects midline and there is no pneumothorax. Soft tissue planes and included osseous structures are non-suspicious.  Old RIGHT rib fractures. IMPRESSION: Similar hyperinflation without focal consolidation. Electronically Signed   By: Elon Alas M.D.   On: 10/24/2017 15:37   Dg Chest Port 1 View  Result Date: 09/28/2017 CLINICAL DATA:  Shortness of breath. EXAM: PORTABLE CHEST 1 VIEW COMPARISON:  September 26, 2017. FINDINGS: Hyperinflation of the lungs. Diaphragm flattening. The heart, hila, mediastinum, lungs, and pleura are otherwise unremarkable. IMPRESSION: Hyperinflation of the lungs suggesting COPD or emphysema. No other acute abnormalities. Electronically Signed   By: Dorise Bullion III M.D   On: 09/28/2017 11:13   Dg Chest Port 1 View  Result Date: 09/26/2017 CLINICAL DATA:  One month history of heartburn and shortness of breath with increasing severity over the past week. History of COPD, current smoker. The patient porch being on home oxygen at home and is on prednisone. History of colonic and prostate malignancy. EXAM: PORTABLE CHEST 1 VIEW COMPARISON:  PA and lateral chest x-ray of September 22, 2017 FINDINGS: The lungs are hyperinflated with hemidiaphragm flattening. There is  no focal infiltrate. There is no pleural effusion. The heart and pulmonary vascularity are normal. The mediastinum is normal in width. There are old deformities of the right third through fifth ribs posterolaterally. IMPRESSION: COPD. No pneumonia, CHF, nor other acute cardiopulmonary abnormality. Electronically Signed   By: David  Martinique M.D.   On: 09/26/2017 12:48      Subjective: - no chest pain, shortness of breath, no abdominal pain, nausea or vomiting.   Discharge Exam: Vitals:   10/25/17 1421 10/25/17 1429  BP:  112/79  Pulse:  86  Resp:  18  Temp:  98.5 F (36.9 C)  SpO2: 97% 99%    General: Pt is alert, awake, not in acute distress Cardiovascular: RRR, S1/S2 +, no rubs, no gallops Respiratory: CTA bilaterally, no wheezing, no rhonchi Abdominal: Soft, NT, ND, bowel sounds + Extremities: no edema, no  cyanosis    The results of significant diagnostics from this hospitalization (including imaging, microbiology, ancillary and laboratory) are listed below for reference.     Microbiology: No results found for this or any previous visit (from the past 240 hour(s)).   Labs: BNP (last 3 results) Recent Labs    06/13/17 1003 08/24/17 1838 09/10/17 2215  BNP 33.8 34.7 36.0   Basic Metabolic Panel: Recent Labs  Lab 10/24/17 1614 10/25/17 0255  NA 138 134*  K 4.2 4.4  CL 98 95*  CO2 31 28  GLUCOSE 154* 316*  BUN 6* 12  CREATININE 0.68 0.80  CALCIUM 8.9 8.5*   Liver Function Tests: Recent Labs  Lab 10/24/17 1614  AST 15  ALT 14  ALKPHOS 63  BILITOT 0.4  PROT 5.9*  ALBUMIN 3.5   No results for input(s): LIPASE, AMYLASE in the last 168 hours. No results for input(s): AMMONIA in the last 168 hours. CBC: Recent Labs  Lab 10/24/17 1614 10/25/17 0255  WBC 8.2 8.5  NEUTROABS 7.0  --   HGB 12.2* 11.9*  HCT 38.0* 36.8*  MCV 93.1 92.2  PLT 292 309   Cardiac Enzymes: No results for input(s): CKTOTAL, CKMB, CKMBINDEX, TROPONINI in the last 168 hours. BNP: Invalid input(s): POCBNP CBG: Recent Labs  Lab 10/25/17 0801 10/25/17 1411  GLUCAP 172* 283*   D-Dimer No results for input(s): DDIMER in the last 72 hours. Hgb A1c No results for input(s): HGBA1C in the last 72 hours. Lipid Profile No results for input(s): CHOL, HDL, LDLCALC, TRIG, CHOLHDL, LDLDIRECT in the last 72 hours. Thyroid function studies No results for input(s): TSH, T4TOTAL, T3FREE, THYROIDAB in the last 72 hours.  Invalid input(s): FREET3 Anemia work up No results for input(s): VITAMINB12, FOLATE, FERRITIN, TIBC, IRON, RETICCTPCT in the last 72 hours. Urinalysis    Component Value Date/Time   COLORURINE YELLOW 06/13/2017 0203   APPEARANCEUR CLEAR 06/13/2017 0203   LABSPEC 1.018 06/13/2017 0203   PHURINE 6.0 06/13/2017 0203   GLUCOSEU 50 (A) 06/13/2017 0203   HGBUR NEGATIVE 06/13/2017  0203   BILIRUBINUR NEGATIVE 06/13/2017 0203   KETONESUR NEGATIVE 06/13/2017 0203   PROTEINUR NEGATIVE 06/13/2017 0203   UROBILINOGEN 1.0 01/09/2013 1843   NITRITE NEGATIVE 06/13/2017 0203   LEUKOCYTESUR NEGATIVE 06/13/2017 0203   Sepsis Labs Invalid input(s): PROCALCITONIN,  WBC,  LACTICIDVEN   Time coordinating discharge: 25 minutes  SIGNED:  Marzetta Board, MD  [facility] 10/25/2017, 5:41 PM Pager [phone]  If 7PM-7AM, please contact night-coverage [email or web address] Password [email or web address]

## 2017-10-27 ENCOUNTER — Ambulatory Visit (INDEPENDENT_AMBULATORY_CARE_PROVIDER_SITE_OTHER): Payer: Medicaid Other | Admitting: Physician Assistant

## 2017-10-27 ENCOUNTER — Other Ambulatory Visit: Payer: Self-pay

## 2017-10-27 ENCOUNTER — Encounter (INDEPENDENT_AMBULATORY_CARE_PROVIDER_SITE_OTHER): Payer: Self-pay | Admitting: Physician Assistant

## 2017-10-27 VITALS — BP 136/77 | HR 97 | Temp 97.8°F | Ht 71.0 in | Wt 170.8 lb

## 2017-10-27 DIAGNOSIS — E119 Type 2 diabetes mellitus without complications: Secondary | ICD-10-CM

## 2017-10-27 DIAGNOSIS — Z760 Encounter for issue of repeat prescription: Secondary | ICD-10-CM

## 2017-10-27 DIAGNOSIS — C61 Malignant neoplasm of prostate: Secondary | ICD-10-CM | POA: Diagnosis not present

## 2017-10-27 DIAGNOSIS — J449 Chronic obstructive pulmonary disease, unspecified: Secondary | ICD-10-CM

## 2017-10-27 DIAGNOSIS — R3911 Hesitancy of micturition: Secondary | ICD-10-CM

## 2017-10-27 DIAGNOSIS — Z23 Encounter for immunization: Secondary | ICD-10-CM

## 2017-10-27 LAB — PATHOLOGIST SMEAR REVIEW

## 2017-10-27 MED ORDER — ALBUTEROL SULFATE (5 MG/ML) 0.5% IN NEBU: 5.0000 mg | INHALATION_SOLUTION | Freq: Four times a day (QID) | RESPIRATORY_TRACT | 5 refills | Status: DC | PRN

## 2017-10-27 MED ORDER — GLUCOSE BLOOD VI STRP: 1.0000 | ORAL_STRIP | 11 refills | Status: AC | PRN

## 2017-10-27 MED ORDER — METFORMIN HCL 1000 MG PO TABS: 1000.0000 mg | ORAL_TABLET | Freq: Two times a day (BID) | ORAL | 3 refills | Status: DC

## 2017-10-27 MED ORDER — ACCU-CHEK SOFT TOUCH LANCETS MISC: 1.0000 | Freq: Two times a day (BID) | 11 refills | Status: AC

## 2017-10-27 MED ORDER — ACCU-CHEK AVIVA DEVI: 0 refills | Status: AC

## 2017-10-27 MED ORDER — TAMSULOSIN HCL 0.4 MG PO CAPS: 0.4000 mg | ORAL_CAPSULE | Freq: Every day | ORAL | 5 refills | Status: DC

## 2017-10-27 MED ORDER — PREDNISONE 20 MG PO TABS: 20.0000 mg | ORAL_TABLET | Freq: Every day | ORAL | 0 refills | Status: DC

## 2017-10-27 MED ORDER — LOVASTATIN 20 MG PO TABS: 20.0000 mg | ORAL_TABLET | Freq: Every day | ORAL | 5 refills | Status: DC

## 2017-10-27 NOTE — Patient Instructions (Signed)
Diabetes Mellitus and Nutrition When you have diabetes (diabetes mellitus), it is very important to have healthy eating habits because your blood sugar (glucose) levels are greatly affected by what you eat and drink. Eating healthy foods in the appropriate amounts, at about the same times every day, can help you:  Control your blood glucose.  Lower your risk of heart disease.  Improve your blood pressure.  Reach or maintain a healthy weight.  Every person with diabetes is different, and each person has different needs for a meal plan. Your health care provider may recommend that you work with a diet and nutrition specialist (dietitian) to make a meal plan that is best for you. Your meal plan may vary depending on factors such as:  The calories you need.  The medicines you take.  Your weight.  Your blood glucose, blood pressure, and cholesterol levels.  Your activity level.  Other health conditions you have, such as heart or kidney disease.  How do carbohydrates affect me? Carbohydrates affect your blood glucose level more than any other type of food. Eating carbohydrates naturally increases the amount of glucose in your blood. Carbohydrate counting is a method for keeping track of how many carbohydrates you eat. Counting carbohydrates is important to keep your blood glucose at a healthy level, especially if you use insulin or take certain oral diabetes medicines. It is important to know how many carbohydrates you can safely have in each meal. This is different for every person. Your dietitian can help you calculate how many carbohydrates you should have at each meal and for snack. Foods that contain carbohydrates include:  Bread, cereal, rice, pasta, and crackers.  Potatoes and corn.  Peas, beans, and lentils.  Milk and yogurt.  Fruit and juice.  Desserts, such as cakes, cookies, ice cream, and candy.  How does alcohol affect me? Alcohol can cause a sudden decrease in blood  glucose (hypoglycemia), especially if you use insulin or take certain oral diabetes medicines. Hypoglycemia can be a life-threatening condition. Symptoms of hypoglycemia (sleepiness, dizziness, and confusion) are similar to symptoms of having too much alcohol. If your health care provider says that alcohol is safe for you, follow these guidelines:  Limit alcohol intake to no more than 1 drink per day for nonpregnant women and 2 drinks per day for men. One drink equals 12 oz of beer, 5 oz of wine, or 1 oz of hard liquor.  Do not drink on an empty stomach.  Keep yourself hydrated with water, diet soda, or unsweetened iced tea.  Keep in mind that regular soda, juice, and other mixers may contain a lot of sugar and must be counted as carbohydrates.  What are tips for following this plan? Reading food labels  Start by checking the serving size on the label. The amount of calories, carbohydrates, fats, and other nutrients listed on the label are based on one serving of the food. Many foods contain more than one serving per package.  Check the total grams (g) of carbohydrates in one serving. You can calculate the number of servings of carbohydrates in one serving by dividing the total carbohydrates by 15. For example, if a food has 30 g of total carbohydrates, it would be equal to 2 servings of carbohydrates.  Check the number of grams (g) of saturated and trans fats in one serving. Choose foods that have low or no amount of these fats.  Check the number of milligrams (mg) of sodium in one serving. Most people   should limit total sodium intake to less than 2,300 mg per day.  Always check the nutrition information of foods labeled as "low-fat" or "nonfat". These foods may be higher in added sugar or refined carbohydrates and should be avoided.  Talk to your dietitian to identify your daily goals for nutrients listed on the label. Shopping  Avoid buying canned, premade, or processed foods. These  foods tend to be high in fat, sodium, and added sugar.  Shop around the outside edge of the grocery store. This includes fresh fruits and vegetables, bulk grains, fresh meats, and fresh dairy. Cooking  Use low-heat cooking methods, such as baking, instead of high-heat cooking methods like deep frying.  Cook using healthy oils, such as olive, canola, or sunflower oil.  Avoid cooking with butter, cream, or high-fat meats. Meal planning  Eat meals and snacks regularly, preferably at the same times every day. Avoid going long periods of time without eating.  Eat foods high in fiber, such as fresh fruits, vegetables, beans, and whole grains. Talk to your dietitian about how many servings of carbohydrates you can eat at each meal.  Eat 4-6 ounces of lean protein each day, such as lean meat, chicken, fish, eggs, or tofu. 1 ounce is equal to 1 ounce of meat, chicken, or fish, 1 egg, or 1/4 cup of tofu.  Eat some foods each day that contain healthy fats, such as avocado, nuts, seeds, and fish. Lifestyle   Check your blood glucose regularly.  Exercise at least 30 minutes 5 or more days each week, or as told by your health care provider.  Take medicines as told by your health care provider.  Do not use any products that contain nicotine or tobacco, such as cigarettes and e-cigarettes. If you need help quitting, ask your health care provider.  Work with a counselor or diabetes educator to identify strategies to manage stress and any emotional and social challenges. What are some questions to ask my health care provider?  Do I need to meet with a diabetes educator?  Do I need to meet with a dietitian?  What number can I call if I have questions?  When are the best times to check my blood glucose? Where to find more information:  American Diabetes Association: diabetes.org/food-and-fitness/food  Academy of Nutrition and Dietetics:  www.eatright.org/resources/health/diseases-and-conditions/diabetes  National Institute of Diabetes and Digestive and Kidney Diseases (NIH): www.niddk.nih.gov/health-information/diabetes/overview/diet-eating-physical-activity Summary  A healthy meal plan will help you control your blood glucose and maintain a healthy lifestyle.  Working with a diet and nutrition specialist (dietitian) can help you make a meal plan that is best for you.  Keep in mind that carbohydrates and alcohol have immediate effects on your blood glucose levels. It is important to count carbohydrates and to use alcohol carefully. This information is not intended to replace advice given to you by your health care provider. Make sure you discuss any questions you have with your health care provider. Document Released: 10/04/2004 Document Revised: 02/12/2016 Document Reviewed: 02/12/2016 Elsevier Interactive Patient Education  2018 [employer]  

## 2017-10-27 NOTE — Progress Notes (Signed)
Subjective:  Patient ID: Taylor [name], male    DOB: Oct 23, 1955  Age: 62 y.o. MRN: [id number]  CC: hospital discharge f/u  HPI Taylor [name] is a 62 y.o. male with a medical history of asthma, adjustment disorder with suicidal ideation, bipolar 1 disorder, colon cancer 2013, COPD, DM2, HLD, Tobacco use disorder, prostate cancer, bilateral inguinal hernia, and rib fractures presents as a new patient on hospital discharge follow up. Frequently goes to the ED for COPD exacerbation. Reportedly went to pulmonology yesterday. Continues to receive O2 from pulmonology. Needs a refill of inhalers [facility].      Last A1c 8.2% one month ago. A1c has been trending up and attributed to chronic prednisone use. Currently taking Metformin 500 mg BID.      Would like a letter to be written that states he should be removed from the sex offender registry because of his medical conditions to include his COPD and prostate cancer.    Outpatient Medications Prior to Visit  Medication Sig Dispense Refill  . albuterol (PROVENTIL HFA;VENTOLIN HFA) 108 (90 Base) MCG/ACT inhaler Inhale 2 puffs into the lungs every 6 (six) hours as needed for wheezing or shortness of breath. 1 Inhaler 3  . albuterol (PROVENTIL) (5 MG/ML) 0.5% nebulizer solution Take 1 mL (5 mg total) by nebulization every 6 (six) hours as needed for wheezing or shortness of breath. 120 mL 3  . ARIPiprazole (ABILIFY) 5 MG tablet Take 1 tablet (5 mg total) by mouth daily with breakfast. 30 tablet 0  . blood glucose meter kit and supplies KIT Dispense based on patient and insurance preference. Use up to four times daily as directed. (FOR ICD-9 250.00, 250.01). (Patient not taking: Reported on 10/17/2017) 1 each 0  . escitalopram (LEXAPRO) 20 MG tablet Take 1 tablet (20 mg total) by mouth daily. 30 tablet 0  . Fluticasone-Umeclidin-Vilant (TRELEGY ELLIPTA) 100-62.5-25 MCG/INH AEPB Inhale 1 puff into the lungs daily. 28 each 2  . folic  acid (FOLVITE) 1 MG tablet Take 1 tablet (1 mg total) by mouth daily. 30 tablet 1  . lovastatin (MEVACOR) 20 MG tablet Take 1 tablet (20 mg total) by mouth at bedtime. 30 tablet 12  . metFORMIN (GLUCOPHAGE) 500 MG tablet Take 1 tablet (500 mg total) by mouth 2 (two) times daily with a meal. 60 tablet 0  . mometasone-formoterol (DULERA) 100-5 MCG/ACT AERO Inhale 2 puffs into the lungs 2 (two) times daily. 1 Inhaler 12  . OXYGEN Inhale 3 L into the lungs continuous.     . predniSONE (DELTASONE) 10 MG tablet Take 10 mg by mouth daily with breakfast.    . predniSONE (DELTASONE) 20 MG tablet Take 1 tablet (20 mg total) by mouth daily with breakfast. Take 20 mg for 4 days then return to prior dose 4 tablet 0  . tamsulosin (FLOMAX) 0.4 MG CAPS capsule Take 1 capsule (0.4 mg total) by mouth daily. 30 capsule 0  . thiamine 100 MG tablet Take 1 tablet (100 mg total) by mouth daily. 30 tablet 1   No facility-administered medications prior to visit.      ROS Review of Systems  Constitutional: Negative for chills, fever and malaise/fatigue.  Eyes: Negative for blurred vision.  Respiratory: Positive for shortness of breath.   Cardiovascular: Negative for chest pain and palpitations.  Gastrointestinal: Negative for abdominal pain and nausea.  Genitourinary: Negative for dysuria and hematuria.  Musculoskeletal: Negative for joint pain and myalgias.  Skin: Negative for rash.  Neurological: Negative for tingling and headaches.  Psychiatric/Behavioral: Negative for depression. The patient is not nervous/anxious.     Objective:  There were no vitals taken for this visit.  BP/Weight 10/25/2017 10/24/2017 01/29/3233  Systolic BP 573 - 220  Diastolic BP 79 - 60  Wt. (Lbs) 163.14 - 166.2  BMI - 22.75 23.18      Physical Exam  Constitutional: He is oriented to person, place, and time.  Well developed, well nourished, NAD, polite, using portable O2  HENT:  Head: Normocephalic and atraumatic.  Eyes:  No scleral icterus.  Neck: Normal range of motion. Neck supple. No thyromegaly present.  Cardiovascular: Normal rate, regular rhythm and normal heart sounds.  Pulmonary/Chest: Effort normal. No stridor. No respiratory distress. He has no wheezes. He has no rales.  Poor air movement diffusely in both lungs.  Musculoskeletal: He exhibits no edema.  Neurological: He is alert and oriented to person, place, and time.  Skin: Skin is warm and dry. No rash noted. No erythema. No pallor.  Psychiatric: He has a normal mood and affect. His behavior is normal. Thought content normal.  Vitals reviewed.    Assessment & Plan:   1. Chronic obstructive pulmonary disease, unspecified COPD type (HCC) - albuterol (PROVENTIL) (5 MG/ML) 0.5% nebulizer solution; Take 1 mL (5 mg total) by nebulization every 6 (six) hours as needed for wheezing or shortness of breath.  Dispense: 120 mL; Refill: 5 - Pneumococcal polysaccharide vaccine 23-valent greater than or equal to 2yo subcutaneous/IM - predniSONE (DELTASONE) 20 MG tablet; Take 1 tablet (20 mg total) by mouth daily with breakfast. Take 20 mg for 4 days then return to prior dose  Dispense: 4 tablet; Refill: 0  2. Prostate cancer ([place]) - tamsulosin (FLOMAX) 0.4 MG CAPS capsule; Take 1 capsule (0.4 mg total) by mouth daily.  Dispense: 30 capsule; Refill: 5 - Ambulatory referral to Urology  3. Type 2 diabetes mellitus without complication, without long-term current use of insulin (HCC) - metFORMIN (GLUCOPHAGE) 1000 MG tablet; Take 1 tablet (1,000 mg total) by mouth 2 (two) times daily with a meal.  Dispense: 180 tablet; Refill: 3 - glucose blood (ACCU-CHEK AVIVA) test strip; 1 each by Other route as needed for other. Use as instructed  Dispense: 100 each; Refill: 11 - Lancets (ACCU-CHEK SOFT TOUCH) lancets; 1 each by Other route 2 (two) times daily. Use as instructed  Dispense: 100 each; Refill: 11 - Blood Glucose Monitoring Suppl (ACCU-CHEK AVIVA) device; Use as  instructed  Dispense: 1 each; Refill: 0  4. Medication refill - lovastatin (MEVACOR) 20 MG tablet; Take 1 tablet (20 mg total) by mouth at bedtime.  Dispense: 30 tablet; Refill: 5  5. Urinary hesitancy - tamsulosin (FLOMAX) 0.4 MG CAPS capsule; Take 1 capsule (0.4 mg total) by mouth daily.  Dispense: 30 capsule; Refill: 5 - Ambulatory referral to Urology   Meds ordered this encounter  Medications  . DISCONTD: predniSONE (DELTASONE) 20 MG tablet    Sig: Take 1 tablet (20 mg total) by mouth daily with breakfast. Take 20 mg for 4 days then return to prior dose    Dispense:  4 tablet    Refill:  0    Order Specific Question:   Supervising Provider    Answer:   Charlott Rakes [[id number]]  . lovastatin (MEVACOR) 20 MG tablet    Sig: Take 1 tablet (20 mg total) by mouth at bedtime.    Dispense:  30 tablet    Refill:  5  Order Specific Question:   Supervising Provider    Answer:   Charlott Rakes [[id number]]  . albuterol (PROVENTIL) (5 MG/ML) 0.5% nebulizer solution    Sig: Take 1 mL (5 mg total) by nebulization every 6 (six) hours as needed for wheezing or shortness of breath.    Dispense:  120 mL    Refill:  5    Dx: J44.9    Order Specific Question:   Supervising Provider    Answer:   Charlott Rakes [[id number]]  . tamsulosin (FLOMAX) 0.4 MG CAPS capsule    Sig: Take 1 capsule (0.4 mg total) by mouth daily.    Dispense:  30 capsule    Refill:  5    Order Specific Question:   Supervising Provider    Answer:   Charlott Rakes [[id number]]  . metFORMIN (GLUCOPHAGE) 1000 MG tablet    Sig: Take 1 tablet (1,000 mg total) by mouth 2 (two) times daily with a meal.    Dispense:  180 tablet    Refill:  3    Order Specific Question:   Supervising Provider    Answer:   Charlott Rakes [[id number]]  . glucose blood (ACCU-CHEK AVIVA) test strip    Sig: 1 each by Other route as needed for other. Use as instructed    Dispense:  100 each    Refill:  11    E11.9    Order Specific Question:   Supervising Provider     Answer:   Charlott Rakes [[id number]]  . Lancets (ACCU-CHEK SOFT TOUCH) lancets    Sig: 1 each by Other route 2 (two) times daily. Use as instructed    Dispense:  100 each    Refill:  11    E11.9    Order Specific Question:   Supervising Provider    Answer:   Charlott Rakes [[id number]]  . Blood Glucose Monitoring Suppl (ACCU-CHEK AVIVA) device    Sig: Use as instructed    Dispense:  1 each    Refill:  0    E11.9    Order Specific Question:   Supervising Provider    Answer:   Charlott Rakes [[id number]]  . predniSONE (DELTASONE) 20 MG tablet    Sig: Take 1 tablet (20 mg total) by mouth daily with breakfast. Take 20 mg for 4 days then return to prior dose    Dispense:  4 tablet    Refill:  0    Order Specific Question:   Supervising Provider    Answer:   Charlott Rakes [[id number]]    Follow-up: Return in about 3 months (around 01/27/2018) for diabetes.   Clent Demark PA

## 2017-10-29 NOTE — Congregational Nurse Program (Signed)
  Dept: [facility] Nurse Program Note  Date of Encounter: 10/28/2017   Visit with Client at [facility] along with SW Santiago Glad. Client dressed up and about in room. Was seen by primary doctor on yesterday.  Received a glucometer to check his BS. States he doesn't know how to use it.  Has all of medications ordered and states that he's taking meds as ordered.  New med added (Metformin) for diabetes.  Client reports no urinary urgency or pressure since being back on flomax. He does report having some diarrhea which he states is from taking the metformin. He received a referral to see a urologist but he has to make the appointment. Reports having no urge to smoke. O2 on 3 1/2 liters when nurse checked, client proceeded to turn it down to 3 as ordered.  B/P 128/80 HR-98 R-22, color good, lungs with diminished breath sounds. Pulse Ox 93%, BS 157. Nurse talked with client about diet and reducing number of carbs, instructed on use of glucometer x2 with return demonstration by client.Nurse provided client with alcohol wipes and cotton balls. Client to check BS in the am and pm before taking metformin and to take med with food. Plan: SW to contact primary doctor re: FL2 form, will also check with [facility] . Nurse will visit on next Monday with client.  Client to continue taking meds as ordered, check BS and keep O2 on 3l/min.  Icholas Irby D. Joneen Caraway MSN, Advice worker Nurse Program [phone]  Past Medical History: Past Medical History:  Diagnosis Date  . Asthma   . Bipolar 1 disorder ([place]) 02/05/2012  . Colon cancer ([place]) 04/11/11   adenocarcinoma of colon, 7/19 nodes pos.FINISHED CHEMO/DR. SHERRILL  . COPD (chronic obstructive pulmonary disease) ([place])    SMOKER  . Depression   . Emphysema of lung ([place])   . Full dentures   . Hemorrhoids   . On home oxygen therapy    "3L; 24/7" (05/29/2017)  . Oxygen deficiency   . Pneumonia ~ 2016   "double pneumonia"  . Prostate cancer ([place])    Gleason score = 7, supposed to have radiation therapy but he has not followed up (05/29/2017)  . Rib fractures    hx of    Encounter Details: CNP Questionnaire - 10/28/17 1730      Questionnaire   Patient Status  Not Applicable    Race  White or Caucasian    Location Patient Served At  [facility] patient    Insurance  Medicaid    Food  No food insecurities    Housing/Utilities  No permanent housing    Transportation  No transportation needs    Interpersonal Safety  Yes, feel physically and emotionally safe where you currently live    Medication  No medication insecurities    Medical Provider  Yes    Referrals  Not Applicable    ED Visit Averted  Not Applicable    Life-Saving Intervention Made  Not Applicable

## 2017-11-04 NOTE — Congregational Nurse Program (Signed)
Nurse made visit without SW who is out of town.  Client up and about in his room today.  Reports doing alright. Reports that he's been checking his glucose twice a day and showed nurse his readings which ranged from 59 (one evening) to 171 (one morning). Taking other meds as ordered and all are present.  B/P 138/84  HR- 93 R-22 Pulse Ox 96% on 3l/min. Color good, lungs with diminished breath sounds. Nurse discussed diabetic diet and need for client to keep OJ around for when his BS drops low. Told that 59 was really low.  Client reported feeling a little shaky and having a headache at the time, states that he drank a coke and then had his dinner.  Nurse informed client those were symptoms of hypoglycemia. Client said he knew that which is why he drank the coke. Nurse spoke with client about discharge being extended until 10/26. Nurse will call his doctor to see if FL2 has been signed off on.  Client still plans to and wants to go to [place] since he will likely not have housing by discharge. Visit planned for next Monday.  Dulcey Riederer D. Joneen Caraway MSN, Advice worker Nurse [phone]

## 2017-11-05 ENCOUNTER — Telehealth: Payer: Self-pay | Admitting: Internal Medicine

## 2017-11-05 NOTE — Telephone Encounter (Signed)
Since I haven't seen him in over a year this should really be a be a PCP issue if PCP agrees and further specialty care can be prn as directed by PCP  thx

## 2017-11-05 NOTE — Telephone Encounter (Signed)
Spoke with Donita with [facility]. I have given her the verbal order to continue care for the pt. Nothing further was needed.

## 2017-11-05 NOTE — Telephone Encounter (Signed)
lmtcb for Taylor [name] at [facility].

## 2017-11-05 NOTE — Telephone Encounter (Signed)
Spoke with Donita at [facility], requesting a VO for palliative care for pt. Last seen by MW: 06/05/16 Last seen by Beth: 10/17/17  MW please advise if ok to order. Thanks.

## 2017-11-07 ENCOUNTER — Other Ambulatory Visit (INDEPENDENT_AMBULATORY_CARE_PROVIDER_SITE_OTHER): Payer: Self-pay | Admitting: Physician Assistant

## 2017-11-07 ENCOUNTER — Telehealth (INDEPENDENT_AMBULATORY_CARE_PROVIDER_SITE_OTHER): Payer: Self-pay | Admitting: Physician Assistant

## 2017-11-07 DIAGNOSIS — J449 Chronic obstructive pulmonary disease, unspecified: Secondary | ICD-10-CM

## 2017-11-07 MED ORDER — ALBUTEROL SULFATE (5 MG/ML) 0.5% IN NEBU: 5.0000 mg | INHALATION_SOLUTION | Freq: Four times a day (QID) | RESPIRATORY_TRACT | 5 refills | Status: DC | PRN

## 2017-11-07 MED ORDER — ALBUTEROL SULFATE (2.5 MG/3ML) 0.083% IN NEBU: 2.5000 mg | INHALATION_SOLUTION | Freq: Four times a day (QID) | RESPIRATORY_TRACT | 5 refills | Status: DC | PRN

## 2017-11-07 NOTE — Telephone Encounter (Signed)
Social Worker Santiago Glad called to request an update on an FL2 form she requested on 10/27/2017, she needs this completed as soon as possible because patient is homeless. When completing the form pt would like to be seen at -[facility], [place], [place] [address] Please follow up Santiago Glad- [phone] p

## 2017-11-07 NOTE — Telephone Encounter (Signed)
Called and spoke with Donita from [facility] stating to her to contact pt's PCP to see if they are okay giving the verbal for palliative care.  Donita expressed understanding. Nothing further needed.

## 2017-11-07 NOTE — Telephone Encounter (Signed)
Left note on FL2 form for provider. Nat Christen, CMA

## 2017-11-07 NOTE — Telephone Encounter (Signed)
FWD to PCP. Heriberto Stmartin S Carnita Golob, CMA  

## 2017-11-07 NOTE — Telephone Encounter (Signed)
[facility] called about  -albuterol (PROVENTIL) (5 MG/ML) 0.5% nebulizer solution  Stating pt requested his older prescription of -(2.5 MG/3ML) 0.083% nebulizer solution 2.5 mg   Please provide pt with a clarification on what he is supposed to take.

## 2017-11-12 ENCOUNTER — Encounter (HOSPITAL_COMMUNITY): Payer: Self-pay

## 2017-11-12 ENCOUNTER — Inpatient Hospital Stay (HOSPITAL_COMMUNITY)
Admission: EM | Admit: 2017-11-12 | Discharge: 2017-11-15 | DRG: 191 | Disposition: A | Discharge status: Skilled Nursing Facility | Payer: Medicaid Other | Attending: Internal Medicine | Admitting: Internal Medicine

## 2017-11-12 ENCOUNTER — Emergency Department (HOSPITAL_COMMUNITY): Payer: Medicaid Other

## 2017-11-12 DIAGNOSIS — Z79899 Other long term (current) drug therapy: Secondary | ICD-10-CM

## 2017-11-12 DIAGNOSIS — E119 Type 2 diabetes mellitus without complications: Secondary | ICD-10-CM | POA: Diagnosis present

## 2017-11-12 DIAGNOSIS — Z7984 Long term (current) use of oral hypoglycemic drugs: Secondary | ICD-10-CM

## 2017-11-12 DIAGNOSIS — F329 Major depressive disorder, single episode, unspecified: Secondary | ICD-10-CM | POA: Diagnosis present

## 2017-11-12 DIAGNOSIS — R251 Tremor, unspecified: Secondary | ICD-10-CM | POA: Diagnosis present

## 2017-11-12 DIAGNOSIS — Z6823 Body mass index (BMI) 23.0-23.9, adult: Secondary | ICD-10-CM

## 2017-11-12 DIAGNOSIS — E440 Moderate protein-calorie malnutrition: Secondary | ICD-10-CM | POA: Diagnosis present

## 2017-11-12 DIAGNOSIS — F419 Anxiety disorder, unspecified: Secondary | ICD-10-CM

## 2017-11-12 DIAGNOSIS — F4322 Adjustment disorder with anxiety: Secondary | ICD-10-CM | POA: Diagnosis present

## 2017-11-12 DIAGNOSIS — Z87891 Personal history of nicotine dependence: Secondary | ICD-10-CM

## 2017-11-12 DIAGNOSIS — F418 Other specified anxiety disorders: Secondary | ICD-10-CM | POA: Diagnosis present

## 2017-11-12 DIAGNOSIS — Z7951 Long term (current) use of inhaled steroids: Secondary | ICD-10-CM

## 2017-11-12 DIAGNOSIS — J441 Chronic obstructive pulmonary disease with (acute) exacerbation: Principal | ICD-10-CM | POA: Diagnosis present

## 2017-11-12 DIAGNOSIS — Z85038 Personal history of other malignant neoplasm of large intestine: Secondary | ICD-10-CM

## 2017-11-12 DIAGNOSIS — C61 Malignant neoplasm of prostate: Secondary | ICD-10-CM | POA: Diagnosis present

## 2017-11-12 DIAGNOSIS — I10 Essential (primary) hypertension: Secondary | ICD-10-CM | POA: Diagnosis present

## 2017-11-12 DIAGNOSIS — Z7952 Long term (current) use of systemic steroids: Secondary | ICD-10-CM

## 2017-11-12 DIAGNOSIS — F32A Depression, unspecified: Secondary | ICD-10-CM | POA: Diagnosis present

## 2017-11-12 LAB — COMPREHENSIVE METABOLIC PANEL
ALT: 14 U/L (ref 0–44)
AST: 22 U/L (ref 15–41)
Albumin: 3.6 g/dL (ref 3.5–5.0)
Alkaline Phosphatase: 62 U/L (ref 38–126)
Anion gap: 11 (ref 5–15)
BUN: 13 mg/dL (ref 8–23)
CO2: 29 mmol/L (ref 22–32)
Calcium: 8.9 mg/dL (ref 8.9–10.3)
Chloride: 101 mmol/L (ref 98–111)
Creatinine, Ser: 0.86 mg/dL (ref 0.61–1.24)
GFR calc Af Amer: >60 mL/min (ref >60)
GFR calc non Af Amer: >60 mL/min (ref >60)
Glucose, Bld: 95 mg/dL (ref 70–99)
Potassium: 3.9 mmol/L (ref 3.5–5.1)
Sodium: 141 mmol/L (ref 135–145)
Total Bilirubin: 0.7 mg/dL (ref 0.3–1.2)
Total Protein: 5.9 g/dL — ABNORMAL LOW (ref 6.5–8.1)

## 2017-11-12 LAB — CBC WITH DIFFERENTIAL/PLATELET
Abs Immature Granulocytes: 0.17 10*3/uL — ABNORMAL HIGH (ref 0.00–0.07)
Basophils Absolute: 0 10*3/uL (ref 0.0–0.1)
Basophils Relative: 1 %
Eosinophils Absolute: 0.2 10*3/uL (ref 0.0–0.5)
Eosinophils Relative: 3 %
HCT: 40.9 % (ref 39.0–52.0)
Hemoglobin: 12.5 g/dL — ABNORMAL LOW (ref 13.0–17.0)
Immature Granulocytes: 2 %
Lymphocytes Relative: 21 %
Lymphs Abs: 1.8 10*3/uL (ref 0.7–4.0)
MCH: 28.6 pg (ref 26.0–34.0)
MCHC: 30.6 g/dL (ref 30.0–36.0)
MCV: 93.6 fL (ref 80.0–100.0)
Monocytes Absolute: 0.7 10*3/uL (ref 0.1–1.0)
Monocytes Relative: 9 %
Neutro Abs: 5.5 10*3/uL (ref 1.7–7.7)
Neutrophils Relative %: 64 %
Platelets: 243 10*3/uL (ref 150–400)
RBC: 4.37 MIL/uL (ref 4.22–5.81)
RDW: 13.3 % (ref 11.5–15.5)
WBC: 8.5 10*3/uL (ref 4.0–10.5)
nRBC: 0 % (ref 0.0–0.2)

## 2017-11-12 MED ORDER — ALBUTEROL (5 MG/ML) CONTINUOUS INHALATION SOLN
10.0000 mg/h | INHALATION_SOLUTION | RESPIRATORY_TRACT | Status: AC
  Administered 2017-11-12: 10 mg/h via RESPIRATORY_TRACT
  Filled 2017-11-12: qty 20

## 2017-11-12 MED ORDER — ALBUTEROL SULFATE (2.5 MG/3ML) 0.083% IN NEBU
5.0000 mg | INHALATION_SOLUTION | Freq: Once | RESPIRATORY_TRACT | Status: DC
  Filled 2017-11-12: qty 6

## 2017-11-12 NOTE — ED Triage Notes (Signed)
Pt. Coming from [facility] via EMS with reports of COPD exacerbation. Pt. Reports shob started yesterday and gradually got worse. Pt. Wheezing in all frields and using abdominal muscles when breathing. Pt. Given 10 of albuterol, 1 of atrovent, NS 125 of solumedrol en route. Pt. Also reports headaches weakness and diarrhea X3 days. A/O X4
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
## 2017-11-13 ENCOUNTER — Other Ambulatory Visit: Payer: Self-pay

## 2017-11-13 ENCOUNTER — Encounter (HOSPITAL_COMMUNITY): Payer: Self-pay | Admitting: Family Medicine

## 2017-11-13 DIAGNOSIS — Z85038 Personal history of other malignant neoplasm of large intestine: Secondary | ICD-10-CM | POA: Diagnosis not present

## 2017-11-13 DIAGNOSIS — C61 Malignant neoplasm of prostate: Secondary | ICD-10-CM | POA: Diagnosis present

## 2017-11-13 DIAGNOSIS — F4322 Adjustment disorder with anxiety: Secondary | ICD-10-CM | POA: Diagnosis present

## 2017-11-13 DIAGNOSIS — F418 Other specified anxiety disorders: Secondary | ICD-10-CM | POA: Diagnosis present

## 2017-11-13 DIAGNOSIS — E440 Moderate protein-calorie malnutrition: Secondary | ICD-10-CM | POA: Diagnosis present

## 2017-11-13 DIAGNOSIS — F419 Anxiety disorder, unspecified: Secondary | ICD-10-CM | POA: Diagnosis not present

## 2017-11-13 DIAGNOSIS — Z7984 Long term (current) use of oral hypoglycemic drugs: Secondary | ICD-10-CM | POA: Diagnosis not present

## 2017-11-13 DIAGNOSIS — Z7951 Long term (current) use of inhaled steroids: Secondary | ICD-10-CM | POA: Diagnosis not present

## 2017-11-13 DIAGNOSIS — Z6823 Body mass index (BMI) 23.0-23.9, adult: Secondary | ICD-10-CM | POA: Diagnosis not present

## 2017-11-13 DIAGNOSIS — E119 Type 2 diabetes mellitus without complications: Secondary | ICD-10-CM | POA: Diagnosis present

## 2017-11-13 DIAGNOSIS — I10 Essential (primary) hypertension: Secondary | ICD-10-CM | POA: Diagnosis present

## 2017-11-13 DIAGNOSIS — Z7952 Long term (current) use of systemic steroids: Secondary | ICD-10-CM | POA: Diagnosis not present

## 2017-11-13 DIAGNOSIS — Z79899 Other long term (current) drug therapy: Secondary | ICD-10-CM | POA: Diagnosis not present

## 2017-11-13 DIAGNOSIS — J441 Chronic obstructive pulmonary disease with (acute) exacerbation: Principal | ICD-10-CM

## 2017-11-13 DIAGNOSIS — Z87891 Personal history of nicotine dependence: Secondary | ICD-10-CM | POA: Diagnosis not present

## 2017-11-13 DIAGNOSIS — R251 Tremor, unspecified: Secondary | ICD-10-CM | POA: Diagnosis present

## 2017-11-13 DIAGNOSIS — F329 Major depressive disorder, single episode, unspecified: Secondary | ICD-10-CM

## 2017-11-13 LAB — GLUCOSE, CAPILLARY
GLUCOSE-CAPILLARY: 254 mg/dL — AB (ref 70–99)
Glucose-Capillary: 216 mg/dL — ABNORMAL HIGH (ref 70–99)
Glucose-Capillary: 164 mg/dL — ABNORMAL HIGH (ref 70–99)
Glucose-Capillary: 184 mg/dL — ABNORMAL HIGH (ref 70–99)

## 2017-11-13 LAB — HIV ANTIBODY (ROUTINE TESTING W REFLEX): HIV Screen 4th Generation wRfx: NONREACTIVE

## 2017-11-13 MED ORDER — FLUTICASONE-UMECLIDIN-VILANT 100-62.5-25 MCG/INH IN AEPB: 1.0000 | INHALATION_SPRAY | Freq: Every day | RESPIRATORY_TRACT | Status: DC

## 2017-11-13 MED ORDER — TAMSULOSIN HCL 0.4 MG PO CAPS
0.4000 mg | ORAL_CAPSULE | Freq: Every day | ORAL | Status: DC
  Administered 2017-11-13 – 2017-11-15 (×3): 0.4 mg via ORAL
  Filled 2017-11-13 (×3): qty 1

## 2017-11-13 MED ORDER — LORAZEPAM 2 MG/ML IJ SOLN
1.0000 mg | Freq: Once | INTRAMUSCULAR | Status: AC
  Administered 2017-11-13: 1 mg via INTRAVENOUS
  Filled 2017-11-13: qty 1

## 2017-11-13 MED ORDER — ESCITALOPRAM OXALATE 10 MG PO TABS
20.0000 mg | ORAL_TABLET | Freq: Every day | ORAL | Status: DC
  Administered 2017-11-13 – 2017-11-15 (×3): 20 mg via ORAL
  Filled 2017-11-13 (×3): qty 2

## 2017-11-13 MED ORDER — POTASSIUM CHLORIDE IN NACL 20-0.9 MEQ/L-% IV SOLN
INTRAVENOUS | Status: AC
  Administered 2017-11-13: 07:00:00 via INTRAVENOUS
  Filled 2017-11-13: qty 1000

## 2017-11-13 MED ORDER — LORAZEPAM 1 MG PO TABS
1.0000 mg | ORAL_TABLET | Freq: Three times a day (TID) | ORAL | Status: DC | PRN
  Administered 2017-11-13 – 2017-11-14 (×3): 1 mg via ORAL
  Filled 2017-11-13 (×3): qty 1

## 2017-11-13 MED ORDER — FLUTICASONE FUROATE-VILANTEROL 100-25 MCG/INH IN AEPB
1.0000 | INHALATION_SPRAY | Freq: Every day | RESPIRATORY_TRACT | Status: DC
  Administered 2017-11-13 – 2017-11-15 (×3): 1 via RESPIRATORY_TRACT
  Filled 2017-11-13: qty 28

## 2017-11-13 MED ORDER — ARIPIPRAZOLE 10 MG PO TABS
5.0000 mg | ORAL_TABLET | Freq: Every day | ORAL | Status: DC
  Administered 2017-11-13 – 2017-11-15 (×3): 5 mg via ORAL
  Filled 2017-11-13 (×3): qty 1

## 2017-11-13 MED ORDER — METHYLPREDNISOLONE SODIUM SUCC 125 MG IJ SOLR
60.0000 mg | Freq: Four times a day (QID) | INTRAMUSCULAR | Status: AC
  Administered 2017-11-13 – 2017-11-14 (×4): 60 mg via INTRAVENOUS
  Filled 2017-11-13 (×4): qty 2

## 2017-11-13 MED ORDER — VITAMIN B-1 100 MG PO TABS
100.0000 mg | ORAL_TABLET | Freq: Every day | ORAL | Status: DC
  Filled 2017-11-13 (×3): qty 1

## 2017-11-13 MED ORDER — PRAVASTATIN SODIUM 20 MG PO TABS
20.0000 mg | ORAL_TABLET | Freq: Every day | ORAL | Status: DC
  Administered 2017-11-13 – 2017-11-14 (×2): 20 mg via ORAL
  Filled 2017-11-13 (×2): qty 1

## 2017-11-13 MED ORDER — SODIUM CHLORIDE 0.9 % IV SOLN
1.0000 g | Freq: Three times a day (TID) | INTRAVENOUS | Status: DC
  Administered 2017-11-13 – 2017-11-15 (×7): 1 g via INTRAVENOUS
  Filled 2017-11-13 (×8): qty 1

## 2017-11-13 MED ORDER — IPRATROPIUM-ALBUTEROL 0.5-2.5 (3) MG/3ML IN SOLN
3.0000 mL | Freq: Four times a day (QID) | RESPIRATORY_TRACT | Status: DC
  Administered 2017-11-13 – 2017-11-15 (×10): 3 mL via RESPIRATORY_TRACT
  Filled 2017-11-13 (×10): qty 3

## 2017-11-13 MED ORDER — INSULIN ASPART 100 UNIT/ML ~~LOC~~ SOLN
0.0000 [IU] | SUBCUTANEOUS | Status: DC
  Administered 2017-11-13: 2 [IU] via SUBCUTANEOUS
  Administered 2017-11-13: 5 [IU] via SUBCUTANEOUS
  Administered 2017-11-13: 2 [IU] via SUBCUTANEOUS
  Administered 2017-11-13: 3 [IU] via SUBCUTANEOUS
  Administered 2017-11-14 (×2): 2 [IU] via SUBCUTANEOUS
  Administered 2017-11-14 – 2017-11-15 (×3): 3 [IU] via SUBCUTANEOUS

## 2017-11-13 MED ORDER — FOLIC ACID 1 MG PO TABS
1.0000 mg | ORAL_TABLET | Freq: Every day | ORAL | Status: DC
  Filled 2017-11-13 (×3): qty 1

## 2017-11-13 MED ORDER — UMECLIDINIUM BROMIDE 62.5 MCG/INH IN AEPB
1.0000 | INHALATION_SPRAY | Freq: Every day | RESPIRATORY_TRACT | Status: DC
  Administered 2017-11-13 – 2017-11-15 (×3): 1 via RESPIRATORY_TRACT
  Filled 2017-11-13: qty 7

## 2017-11-13 MED ORDER — ALBUTEROL SULFATE (2.5 MG/3ML) 0.083% IN NEBU: 2.5000 mg | INHALATION_SOLUTION | RESPIRATORY_TRACT | Status: DC | PRN

## 2017-11-13 MED ORDER — PREDNISONE 20 MG PO TABS
40.0000 mg | ORAL_TABLET | Freq: Every day | ORAL | Status: DC
  Administered 2017-11-15: 40 mg via ORAL
  Filled 2017-11-13 (×2): qty 2

## 2017-11-13 MED ORDER — ENOXAPARIN SODIUM 40 MG/0.4ML ~~LOC~~ SOLN
40.0000 mg | SUBCUTANEOUS | Status: DC
  Administered 2017-11-13 – 2017-11-14 (×2): 40 mg via SUBCUTANEOUS
  Filled 2017-11-13 (×3): qty 0.4

## 2017-11-13 NOTE — Evaluation (Signed)
Physical Therapy Evaluation Patient Details Name: Taylor [name] MRN: [id number] DOB: 11-19-1955 Today's Date: 11/13/2017   History of Present Illness  Pt is a 62 y/o male admitted secondary to SOB and found to have a COPD exacerbation. PMH including but not limited to Bipolar, COPD and recent diagnosis of prostate cancer.    Clinical Impression  Pt presented supine in bed with HOB elevated, awake and willing to participate in therapy session. Prior to admission, pt reported that he was independent with functional mobility and ADLs. Pt is currently living in a motel. He stated that he does not have any family/friend support. He is currently able to perform bed mobility with supervision, transfers with min guard and ambulate a short distance in hallway with min guard without use of an AD. Pt limited secondary to fatigue and increased SOB. Pt on 3L of O2 throughout with SPO2 maintaining >91%. Pt would continue to benefit from skilled physical therapy services at this time while admitted and after d/c to address the below listed limitations in order to improve overall safety and independence with functional mobility.     Follow Up Recommendations Home health PT    Equipment Recommendations  None recommended by PT    Recommendations for Other Services       Precautions / Restrictions Precautions Precautions: Fall Precaution Comments: O2 dependent Restrictions Weight Bearing Restrictions: No      Mobility  Bed Mobility Overal bed mobility: Needs Assistance Bed Mobility: Supine to Sit     Supine to sit: Supervision     General bed mobility comments: increased time  Transfers Overall transfer level: Needs assistance Equipment used: None Transfers: Sit to/from Stand Sit to Stand: Min guard         General transfer comment: min guard for safety, pt steady with transition into standing from EOB  Ambulation/Gait Ambulation/Gait assistance: Min guard Gait Distance  (Feet): 50 Feet Assistive device: None Gait Pattern/deviations: Step-through pattern;Decreased stride length Gait velocity: decreased   General Gait Details: pt with mild instability but no overt LOB or need for physical assistance, min guard for safety without use of an AD. Pt limited secondary to fatigue and SOB. Pt on 3L of O2 throughout with SPO2 maintaining >91% throughout.  Stairs            Wheelchair Mobility    Modified Rankin (Stroke Patients Only)       Balance Overall balance assessment: Needs assistance Sitting-balance support: Feet supported Sitting balance-Leahy Scale: Good     Standing balance support: During functional activity;No upper extremity supported Standing balance-Leahy Scale: Fair                               Pertinent Vitals/Pain Pain Assessment: No/denies pain    Home Living Family/patient expects to be discharged to:: Other (Comment) Living Arrangements: Alone               Additional Comments: pt reported he has been living at a [employer] with no steps/stairs. He stated that he has no medical equipment besides his oxygen, but there is a seat in the shower at the hotel    Prior Function Level of Independence: Independent               Hand Dominance        Extremity/Trunk Assessment   Upper Extremity Assessment Upper Extremity Assessment: Overall WFL for tasks assessed    Lower Extremity  Assessment Lower Extremity Assessment: Generalized weakness       Communication   Communication: No difficulties  Cognition Arousal/Alertness: Awake/alert Behavior During Therapy: WFL for tasks assessed/performed Overall Cognitive Status: Within Functional Limits for tasks assessed                                        General Comments      Exercises     Assessment/Plan    PT Assessment Patient needs continued PT services  PT Problem List Decreased balance;Decreased mobility;Decreased  coordination;Cardiopulmonary status limiting activity       PT Treatment Interventions DME instruction;Gait training;Stair training;Functional mobility training;Therapeutic activities;Therapeutic exercise;Balance training;Neuromuscular re-education;Patient/family education    PT Goals (Current goals can be found in the Care Plan section)  Acute Rehab PT Goals Patient Stated Goal: feel better PT Goal Formulation: With patient Time For Goal Achievement: 11/27/17 Potential to Achieve Goals: Good    Frequency Min 3X/week   Barriers to discharge        Co-evaluation               AM-PAC PT "6 Clicks" Daily Activity  Outcome Measure Difficulty turning over in bed (including adjusting bedclothes, sheets and blankets)?: None Difficulty moving from lying on back to sitting on the side of the bed? : None Difficulty sitting down on and standing up from a chair with arms (e.g., wheelchair, bedside commode, etc,.)?: A Little Help needed moving to and from a bed to chair (including a wheelchair)?: A Little Help needed walking in hospital room?: A Little Help needed climbing 3-5 steps with a railing? : A Little 6 Click Score: 20    End of Session Equipment Utilized During Treatment: Gait belt;Oxygen(3L of O2) Activity Tolerance: Patient tolerated treatment well Patient left: in chair;with call bell/phone within reach Nurse Communication: Mobility status PT Visit Diagnosis: Other abnormalities of gait and mobility (R26.89)    Time: 0370-9643 PT Time Calculation (min) (ACUTE ONLY): 22 min   Charges:   PT Evaluation $PT Eval Moderate Complexity: 1 Mod          Sherie Don, PT, DPT  Acute Rehabilitation Services Pager [phone] Office [place] 11/13/2017, 10:59 AM

## 2017-11-13 NOTE — ED Notes (Signed)
Admitting MD at bedside.

## 2017-11-13 NOTE — Progress Notes (Signed)
Initial Nutrition Assessment  DOCUMENTATION CODES:   Non-severe (moderate) malnutrition in context of chronic illness  INTERVENTION:  Provide Glucerna Shake po BID, each supplement provides 220 kcal and 10 grams of protein  Encourage adequate PO intake.   NUTRITION DIAGNOSIS:   Moderate Malnutrition related to chronic illness(COPD, prostate cancer) as evidenced by moderate fat depletion, moderate muscle depletion.  GOAL:   Patient will meet greater than or equal to 90% of their needs  MONITOR:   PO intake, Supplement acceptance, Labs, Weight trends, I & O's, Skin  REASON FOR ASSESSMENT:   Consult Assessment of nutrition requirement/status  ASSESSMENT:   62 y.o. male with medical history significant for steroid-dependent COPD, depression with anxiety, type 2 diabetes mellitus, and recent diagnosis of prostate cancer, presenting with increased shortness of breath and wheezing.  Meal completion has been 75-100%. Pt reports appetite has improved. Pt reports pt has been eating well PTA with usual consumption of at least 3 meals a day. Pt reports meals usually consists of canned and frozen foods. Pt has been conscious regarding sodium content in food. Pt with no significant weight loss per weight records. RD to order nutritional supplements to aid in increased caloric and protein needs. Labs and medications reviewed.   NUTRITION - FOCUSED PHYSICAL EXAM:    Most Recent Value  Orbital Region  No depletion  Upper Arm Region  Moderate depletion  Thoracic and Lumbar Region  No depletion  Buccal Region  No depletion  Temple Region  No depletion  Clavicle Bone Region  No depletion  Clavicle and Acromion Bone Region  No depletion  Scapular Bone Region  No depletion  Dorsal Hand  No depletion  Patellar Region  Mild depletion  Anterior Thigh Region  Mild depletion  Posterior Calf Region  Moderate depletion  Edema (RD Assessment)  None  Hair  Reviewed  Eyes  Reviewed  Mouth   Reviewed  Skin  Reviewed  Nails  Reviewed       Diet Order:   Diet Order            Diet heart healthy/carb modified Room service appropriate? Yes; Fluid consistency: Thin  Diet effective now              EDUCATION NEEDS:   Not appropriate for education at this time  Skin:  Skin Assessment: Reviewed RN Assessment  Last BM:  Unknown  Height:   Ht Readings from Last 1 Encounters:  11/13/17 5\' 11"  (1.803 m)    Weight:   Wt Readings from Last 1 Encounters:  11/13/17 77 kg    Ideal Body Weight:  78 kg  BMI:  Body mass index is 23.68 kg/m.  Estimated Nutritional Needs:   Kcal:  2000-2200  Protein:  100-110 grams  Fluid:  2- 2.2 L/day    Corrin Parker, MS, RD, LDN Pager # [phone] After hours/ weekend pager # [phone]

## 2017-11-13 NOTE — Progress Notes (Signed)
HPI: Taylor [name] is a 62 y.o. male with medical history significant for steroid-dependent COPD, depression with anxiety, type 2 diabetes mellitus, and recent diagnosis of prostate cancer, now presenting to the emergency department for evaluation of increased shortness of breath and wheezing. He was admitted for copd exacerbation.  He was admitted earlier this am by Dr Myna Hidalgo and see hisn ote for details.    Plan: Continue with IV solumedrol today as he is still very dyspneic, tachypnea and having tremors in his hands from the breathing treatment.  On duonebs.  Continue with IV CEFEPIME.  Hosie Poisson MD [phone]

## 2017-11-13 NOTE — H&P (Signed)
History and Physical    Taylor [name] LEX:[id number] DOB: [birth year] DOA: 11/12/2017  PCP: Taylor Demark, PA-C   Patient coming from: Home   Chief Complaint: SOB, wheezing   HPI: Taylor [name] is a 62 y.o. male with medical history significant for steroid-dependent COPD, depression with anxiety, type 2 diabetes mellitus, and recent diagnosis of prostate cancer, now presenting to the emergency department for evaluation of increased shortness of breath and wheezing.  Patient reports that over the past 3 days he has been experiencing progressive worsening in his chronic dyspnea and wheezing, with only slight worsening in his chronic cough.  He denies any chest pain associated with this.  Reports adherence with his medications.  He also reports some loose stools recently without abdominal pain, melena, hematochezia, nausea, or vomiting.  Denies fevers, chills, sick contacts, or travels.  Reports a generalized resting tremor that he attributes to frequent use of his albuterol, and he reports not drinking any alcohol since he was incarcerated.  Symptoms became severe last night and he called EMS.  He was treated with 125 mg of IV Solu-Medrol and nebs prior to arrival in the ED.  ED Course: Upon arrival to the ED, patient is found to be afebrile, saturating low 90s, tachypneic, mildly tachycardic, and with stable blood pressure.  EKG features a sinus tachycardia with rate 103 and PVCs.  Chest x-ray findings are consistent with COPD, but no acute cardiopulmonary disease.  Chemistry panel and CBC are unremarkable.  Patient was given additional neb treatments in the ED, reports some slight improvement with this, but remains dyspneic at rest and only able to ambulate a few steps.  He will be observed in the hospital for ongoing evaluation and management of acute exacerbation in COPD.  Review of Systems:  All other systems reviewed and apart from HPI, are negative.  Past Medical History:    Diagnosis Date  . Asthma   . Bipolar 1 disorder (Taylor [name]) 02/05/2012  . Colon cancer ([place]) 04/11/11   adenocarcinoma of colon, 7/19 nodes pos.FINISHED CHEMO/DR. SHERRILL  . COPD (chronic obstructive pulmonary disease) ([place])    SMOKER  . Depression   . Emphysema of lung ([place])   . Full dentures   . Hemorrhoids   . On home oxygen therapy    "3L; 24/7" (05/29/2017)  . Oxygen deficiency   . Pneumonia ~ 2016   "double pneumonia"  . Prostate cancer ([place])    Gleason score = 7, supposed to have radiation therapy but he has not followed up (05/29/2017)  . Rib fractures    hx of    Past Surgical History:  Procedure Laterality Date  . COLON SURGERY  04/11/11   Sigmoid colectomy  . COLOSTOMY REVISION  04/11/2011   Procedure: COLON RESECTION SIGMOID;  Surgeon: Taylor Regal, MD;  Location: [facility];  Service: General;  Laterality: N/A;  low anterior colon resection   . HERNIA REPAIR    . INGUINAL HERNIA REPAIR Right 05/01/2012   Procedure: HERNIA REPAIR INGUINAL ADULT;  Surgeon: Taylor Regal, MD;  Location: [facility];  Service: General;  Laterality: Right;  . INSERTION OF MESH Right 05/01/2012   Procedure: INSERTION OF MESH;  Surgeon: Taylor Regal, MD;  Location: [facility];  Service: General;  Laterality: Right;  . PORT-A-CATH REMOVAL Left 05/01/2012   Procedure: REMOVAL Infusion Port;  Surgeon: Taylor Regal, MD;  Location: [facility];  Service: General;  Laterality: Left;  . PORTACATH PLACEMENT  05/02/2011  Procedure: INSERTION PORT-A-CATH;  Surgeon: Taylor Regal, MD;  Location: [facility];  Service: General;  Laterality: N/A;  . PROSTATE BIOPSY       reports that he quit smoking about 5 months ago. His smoking use included cigarettes. He has a 4.60 pack-year smoking history. He has quit using smokeless tobacco.  His smokeless tobacco use included chew. He reports that he drank alcohol. He reports that he does not use drugs.  Allergies  Allergen Reactions  . Codeine Nausea And Vomiting    Family History   Problem Relation Age of Onset  . Heart disease Father   . Lung cancer Maternal Uncle        smoked     Prior to Admission medications   Medication Sig Start Date End Date Taking? Authorizing Provider  albuterol (PROVENTIL HFA;VENTOLIN HFA) 108 (90 Base) MCG/ACT inhaler Inhale 2 puffs into the lungs every 6 (six) hours as needed for wheezing or shortness of breath. 10/14/17  Yes Varney Biles, MD  albuterol (PROVENTIL) (2.5 MG/3ML) 0.083% nebulizer solution Take 3 mLs (2.5 mg total) by nebulization every 6 (six) hours as needed for wheezing or shortness of breath. 11/07/17  Yes Taylor Demark, PA-C  ARIPiprazole (ABILIFY) 5 MG tablet Take 1 tablet (5 mg total) by mouth daily with breakfast. 10/14/17  Yes Varney Biles, MD  Blood Glucose Monitoring Suppl (ACCU-CHEK AVIVA) device Use as instructed 10/27/17 10/27/18 Yes Taylor Demark, PA-C  escitalopram (LEXAPRO) 20 MG tablet Take 1 tablet (20 mg total) by mouth daily. 10/14/17  Yes Varney Biles, MD  Fluticasone-Umeclidin-Vilant (TRELEGY ELLIPTA) 100-62.5-25 MCG/INH AEPB Inhale 1 puff into the lungs daily. 10/17/17  Yes Martyn Ehrich, NP  folic acid (FOLVITE) 1 MG tablet Take 1 tablet (1 mg total) by mouth daily. 10/14/17  Yes Nanavati, Ankit, MD  glucose blood (ACCU-CHEK AVIVA) test strip 1 each by Other route as needed for other. Use as instructed 10/27/17  Yes Taylor Demark, PA-C  Lancets (ACCU-CHEK SOFT [facility]) lancets 1 each by Other route 2 (two) times daily. Use as instructed 10/27/17  Yes Taylor Demark, PA-C  lovastatin (MEVACOR) 20 MG tablet Take 1 tablet (20 mg total) by mouth at bedtime. 10/27/17  Yes Taylor Demark, PA-C  metFORMIN (GLUCOPHAGE) 1000 MG tablet Take 1 tablet (1,000 mg total) by mouth 2 (two) times daily with a meal. 10/27/17  Yes Taylor Demark, PA-C  mometasone-formoterol [facility]) 100-5 MCG/ACT AERO Inhale 2 puffs into the lungs 2 (two) times daily. 10/14/17  Yes Nanavati, Ankit, MD  OXYGEN  Inhale 3 L into the lungs continuous.    Yes [provider]  predniSONE (DELTASONE) 10 MG tablet Take 10 mg by mouth daily with breakfast.   Yes [provider]  tamsulosin (FLOMAX) 0.4 MG CAPS capsule Take 1 capsule (0.4 mg total) by mouth daily. 10/27/17  Yes Taylor Demark, PA-C  thiamine 100 MG tablet Take 1 tablet (100 mg total) by mouth daily. 10/14/17  Yes Varney Biles, MD    Physical Exam: Vitals:   11/13/17 0400 11/13/17 0415 11/13/17 0509 11/13/17 0514  BP: 118/70   113/67  Pulse: (!) 103   (!) 106  Resp:    20  Temp:    98.3 F (36.8 C)  TempSrc:    Oral  SpO2: 93% 96%  97%  Weight:   77 kg   Height:   5\' 11"  (1.803 m)     Constitutional: Tachypneic, no pallor, no diaphoresis Eyes:  PERTLA, lids and conjunctivae normal ENMT: Mucous membranes are moist. Posterior pharynx clear of any exudate or lesions.   Neck: normal, supple, no masses, no thyromegaly Respiratory: Breath sounds diminished bilaterally with prolonged expiratory phase and occasional end-expiratory wheeze. No pallor or cyanosis  Cardiovascular: Rate ~110 and regular. No extremity edema.  Abdomen: No distension, no tenderness, soft. Bowel sounds normal.  Musculoskeletal: no clubbing / cyanosis. No joint deformity upper and lower extremities.   Skin: no significant rashes, lesions, ulcers. Warm, dry, well-perfused. Neurologic: No facial asymmetry. Resting tremor. Sensation intact. Strength 5/5 in all 4 limbs.  Psychiatric: Alert and oriented x 3. Calm, cooperative.    Labs on Admission: I have personally reviewed following labs and imaging studies  CBC: Recent Labs  Lab 11/12/17 2250  WBC 8.5  NEUTROABS 5.5  HGB 12.5*  HCT 40.9  MCV 93.6  PLT 665   Basic Metabolic Panel: Recent Labs  Lab 11/12/17 2250  NA 141  K 3.9  CL 101  CO2 29  GLUCOSE 95  BUN 13  CREATININE 0.86  CALCIUM 8.9   GFR: Estimated Creatinine Clearance: 94.9 mL/min (by C-G formula based on SCr of  0.86 mg/dL). Liver Function Tests: Recent Labs  Lab 11/12/17 2250  AST 22  ALT 14  ALKPHOS 62  BILITOT 0.7  PROT 5.9*  ALBUMIN 3.6   No results for input(s): LIPASE, AMYLASE in the last 168 hours. No results for input(s): AMMONIA in the last 168 hours. Coagulation Profile: No results for input(s): INR, PROTIME in the last 168 hours. Cardiac Enzymes: No results for input(s): CKTOTAL, CKMB, CKMBINDEX, TROPONINI in the last 168 hours. BNP (last 3 results) No results for input(s): PROBNP in the last 8760 hours. HbA1C: No results for input(s): HGBA1C in the last 72 hours. CBG: No results for input(s): GLUCAP in the last 168 hours. Lipid Profile: No results for input(s): CHOL, HDL, LDLCALC, TRIG, CHOLHDL, LDLDIRECT in the last 72 hours. Thyroid Function Tests: No results for input(s): TSH, T4TOTAL, FREET4, T3FREE, THYROIDAB in the last 72 hours. Anemia Panel: No results for input(s): VITAMINB12, FOLATE, FERRITIN, TIBC, IRON, RETICCTPCT in the last 72 hours. Urine analysis:    Component Value Date/Time   COLORURINE YELLOW 06/13/2017 0203   APPEARANCEUR CLEAR 06/13/2017 0203   LABSPEC 1.018 06/13/2017 0203   PHURINE 6.0 06/13/2017 0203   GLUCOSEU 50 (A) 06/13/2017 0203   HGBUR NEGATIVE 06/13/2017 0203   BILIRUBINUR NEGATIVE 06/13/2017 0203   KETONESUR NEGATIVE 06/13/2017 0203   PROTEINUR NEGATIVE 06/13/2017 0203   UROBILINOGEN 1.0 01/09/2013 1843   NITRITE NEGATIVE 06/13/2017 0203   LEUKOCYTESUR NEGATIVE 06/13/2017 0203   Sepsis Labs: @LABRCNTIP (procalcitonin:4,lacticidven:4) )No results found for this or any previous visit (from the past 240 hour(s)).   Radiological Exams on Admission: Dg Chest 2 View  Result Date: 11/12/2017 CLINICAL DATA:  COPD, shortness of breath EXAM: CHEST - 2 VIEW COMPARISON:  10/24/2017 FINDINGS: There is hyperinflation of the lungs compatible with COPD. Heart and mediastinal contours are within normal limits. No focal opacities or effusions. No  acute bony abnormality. Old right rib fractures. IMPRESSION: COPD.  No active disease. Electronically Signed   By: Rolm Baptise M.D.   On: 11/12/2017 23:09    EKG: Independently reviewed. Sinus tachycardia (rate 103), PVC's.   Assessment/Plan  1. COPD with acute exacerbation  - Presents with several days of progressive SOB and wheezing  - No acute findings on CXR, reports some improvement after systemic steroids and multiple neb treatments, but remains  dyspneic at rest and only able to ambulate a few steps  - Check sputum culture, continue systemic steroid, start cefepime given chronic prednisone use, continue ICS, LABA, LAMA, and as-needed albuterol nebs    2. Type II DM  - A1c was 8.2% last month  - Managed with metformin at home, held on admission  - Check CBG's and use a SSI with Novlog while in hospital   3. Depression with anxiety  - Continue Lexapro and Abilify    4. Prostate cancer  - Reports this was diagnosed during recent incarceration and he is scheduled to follow-up with [facility] next month     DVT prophylaxis: Lovenox Code Status: Full  Family Communication: Discussed with patient  Consults called: None Admission status: Observation     Vianne Bulls, MD [facility] Pager [phone]  If 7PM-7AM, please contact night-coverage [email or web address] Password [facility]  11/13/2017, 5:44 AM

## 2017-11-13 NOTE — ED Notes (Signed)
Spoke with Opyd, MD about pt. HR and asked if pt. Was appropriate for medserg. Opyd. Reviewed EKGs and confirmed he fit criteria.

## 2017-11-13 NOTE — ED Notes (Signed)
Pt ambulated in hall on pulse. Pt trembles head-to-toe but, is strong on feet. Saturation reaches 92% while ambulating.

## 2017-11-13 NOTE — Evaluation (Signed)
Occupational Therapy Evaluation and Discharge Patient Details Name: Taylor [name] MRN: [id number] DOB: 08-14-1955 Today's Date: 11/13/2017    History of Present Illness Pt is a 62 y/o male admitted secondary to SOB and found to have a COPD exacerbation. PMH including but not limited to Bipolar, COPD and recent diagnosis of prostate cancer.   Clinical Impression   Pt with 14 admissions in 6 months, most for COPD exacerbations. Pt reports being able to ambulate without a device and completes ADL independently. He has little social support and resides in a motel. Pt is overall functioning at a supervision level for safety and line management this visit. He is well versed in energy conservation strategies and breathing techniques. No OT needs identified. Signing off.    Follow Up Recommendations  No OT follow up    Equipment Recommendations  None recommended by OT    Recommendations for Other Services       Precautions / Restrictions Precautions Precaution Comments: O2 dependent Restrictions Weight Bearing Restrictions: No      Mobility Bed Mobility         Supine to sit: Modified independent (Device/Increase time)     General bed mobility comments: to return to bed  Transfers Overall transfer level: Needs assistance Equipment used: None Transfers: Sit to/from Stand Sit to Stand: Supervision         General transfer comment: supervision for lines    Balance Overall balance assessment: Needs assistance   Sitting balance-Leahy Scale: Good       Standing balance-Leahy Scale: Fair                             ADL either performed or assessed with clinical judgement   ADL Overall ADL's : Modified independent(to supervision for safety)                                       General ADL Comments: pt verbalized understanding of energy conservation and demonstrated ability to perform pursed lip breathing techniques     Vision  Baseline Vision/History: Wears glasses Wears Glasses: At all times Patient Visual Report: No change from baseline       Perception     Praxis      Pertinent Vitals/Pain Pain Assessment: No/denies pain     Hand Dominance Right   Extremity/Trunk Assessment Upper Extremity Assessment Upper Extremity Assessment: Overall WFL for tasks assessed(tremulous)   Lower Extremity Assessment Lower Extremity Assessment: Defer to PT evaluation       Communication Communication Communication: No difficulties   Cognition Arousal/Alertness: Awake/alert Behavior During Therapy: WFL for tasks assessed/performed Overall Cognitive Status: Within Functional Limits for tasks assessed                                 General Comments: reports being on anxiety medicine this hospitalization.   General Comments       Exercises     Shoulder Instructions      Home Living   Living Arrangements: Alone                               Additional Comments: pt reported he has been living at a [employer] with no steps/stairs. He stated that he  has no medical equipment besides his oxygen, but there is a seat in the shower at the hotel      Prior Functioning/Environment Level of Independence: Independent                 OT Problem List:        OT Treatment/Interventions:      OT Goals(Current goals can be found in the care plan section) Acute Rehab OT Goals Patient Stated Goal: feel better  OT Frequency:     Barriers to D/C:            Co-evaluation              AM-PAC PT "6 Clicks" Daily Activity     Outcome Measure Help from another person eating meals?: None Help from another person taking care of personal grooming?: A Little Help from another person toileting, which includes using toliet, bedpan, or urinal?: A Little Help from another person bathing (including washing, rinsing, drying)?: A Little Help from another person to put on and taking off  regular upper body clothing?: None Help from another person to put on and taking off regular lower body clothing?: None 6 Click Score: 21   End of Session Equipment Utilized During Treatment: Oxygen(3L)  Activity Tolerance: Patient tolerated treatment well Patient left: in bed;with call bell/phone within reach  OT Visit Diagnosis: Muscle weakness (generalized) (M62.81)                Time: 5093-2671 OT Time Calculation (min): 20 min Charges:  OT General Charges $OT Visit: 1 Visit OT Evaluation $OT Eval Low Complexity: 1 Low  Malka So 11/13/2017, 3:58 PM  Nestor Lewandowsky, OTR/L Acute Rehabilitation Services Pager: [phone] Office: [phone]

## 2017-11-13 NOTE — ED Provider Notes (Signed)
Patient signed out to me at shift change.  Patient was shortness of breath and presumed COPD exacerbation, getting continuous albuterol treatment.  12:58 AM, patient reassessed, lungs are clear, patient feels significantly improved regarding his breathing, but is still short of breath.  He remains concerned about his diarrhea.  He denies any focal abdominal pain, but states that he has had some cramps.  He does not have any tenderness on exam.  Denies any blood in stools.  Denies any fevers.  Patient is on 3 L nasal cannula 24/7.  RN informs me and Dr. Leonides Schanz that patient HR is varying between the 110s-150.  Patient jittery.  Likely secondary to continuous albuterol treatment.  Will continue to monitor.  3:02 AM Patient reassessed.  States that still feels short of breath.  He ambulates a short distance, and states that he feels very weak, and had to return to the bed.  His O2 saturation was 92% while moving from the bed to the hallway.  He states that his shortness of breath was increased and that he felt very fatigued.  Will consult hospitalist for admission for COPD exacerbation.  Have a low suspicion for PE, patient has had several PE studies in the past after having similar symptoms, but has never been found to have a PE.  I believe his symptoms to be COPD exacerbation.  CRITICAL CARE Performed by: Montine Circle  Continuous albuterol treatment, multiple nebs, multiple reassessments, requiring admission for ongoing monitoring of COPD exacerbation. Total critical care time: 44 minutes  Critical care time was exclusive of separately billable procedures and treating other patients.  Critical care was necessary to treat or prevent imminent or life-threatening deterioration.  Critical care was time spent personally by me on the following activities: development of treatment plan with patient and/or surrogate as well as nursing, discussions with consultants, evaluation of patient's response to  treatment, examination of patient, obtaining history from patient or surrogate, ordering and performing treatments and interventions, ordering and review of laboratory studies, ordering and review of radiographic studies, pulse oximetry and re-evaluation of patient's condition.    Montine Circle, PA-C 11/13/17 0327    Ward, Delice Bison, DO 11/13/17 [id number]

## 2017-11-14 DIAGNOSIS — I10 Essential (primary) hypertension: Secondary | ICD-10-CM

## 2017-11-14 LAB — GLUCOSE, CAPILLARY
GLUCOSE-CAPILLARY: 162 mg/dL — AB (ref 70–99)
GLUCOSE-CAPILLARY: 213 mg/dL — AB (ref 70–99)
GLUCOSE-CAPILLARY: 99 mg/dL (ref 70–99)
Glucose-Capillary: 196 mg/dL — ABNORMAL HIGH (ref 70–99)
Glucose-Capillary: 231 mg/dL — ABNORMAL HIGH (ref 70–99)
Glucose-Capillary: 79 mg/dL (ref 70–99)

## 2017-11-14 MED ORDER — IPRATROPIUM-ALBUTEROL 0.5-2.5 (3) MG/3ML IN SOLN: 3.0000 mL | Freq: Four times a day (QID) | RESPIRATORY_TRACT | 2 refills | Status: DC

## 2017-11-14 MED ORDER — PREDNISONE 20 MG PO TABS: 10.0000 mg | ORAL_TABLET | Freq: Every day | ORAL | 0 refills | Status: DC

## 2017-11-14 MED ORDER — LORAZEPAM 1 MG PO TABS: 1.0000 mg | ORAL_TABLET | Freq: Three times a day (TID) | ORAL | 0 refills | Status: AC | PRN

## 2017-11-14 MED ORDER — DOXYCYCLINE HYCLATE 50 MG PO CAPS: 100.0000 mg | ORAL_CAPSULE | Freq: Two times a day (BID) | ORAL | 0 refills | Status: AC

## 2017-11-14 NOTE — Discharge Summary (Signed)
Physician Discharge Summary  NEEMA FLUEGGE VVO:[id number] DOB: 02/05/1955 DOA: 11/12/2017  PCP: Clent Demark, PA-C  Admit date: 11/12/2017 Discharge date: 11/14/2017  Admitted From: HOME.  Disposition: SNF  Recommendations for Outpatient Follow-up:  1. Follow up with PCP in 1-2 weeks 2. Please obtain BMP/CBC in one week Please follow up with Dr Melvyn Novas next week as scheduled Recommend follow up with [facility] next month.   Discharge Condition:stable.  CODE STATUS: full code.  Diet recommendation: Heart Healthy  Brief/Interim Summary: Hobie Kohles Hendersonis a 62 y.o.malewith medical history significant forsteroid-dependent COPD, depression with anxiety, type 2 diabetes mellitus, and recent diagnosis of prostate cancer, now presenting to the emergency department for evaluation of increased shortness of breath and wheezing. He was admitted for copd exacerbation.   Discharge Diagnoses:  Principal Problem:   COPD with acute exacerbation ([place]) Active Problems:   Prostate cancer ([place])   Anxiety and depression   Adjustment disorder with anxiety   Type 2 diabetes mellitus ([place])   Essential hypertension   Acute copd exacerbation;  Admitted for IV steroids. Wheezing has improved. breathing better, . Still requiring at 3l it of [place] oxygen.  Will transition him to steroid taper and duo nebs on discharge.    Hypertension: well controlled.    Tremors:  Unclear if resting tremors or from the albuterol.  Pt is not sure if he tried inderal in the past, but he says ativan works much better for his tremors.  Ordered Ativan for 3 days . Recommend outpatient follow upw ith PCP/ Dr Melvyn Novas    Diabetes mellitus: Continue home meds.   Depression with anxiety: Resume lexapro and abilify   Prostate cancer:  Follow up with [facility] as scheduled next month.   Discharge Instructions  Discharge Instructions    Diet - low sodium heart healthy   Complete by:  As  directed      Allergies as of 11/14/2017      Reactions   Codeine Nausea And Vomiting      Medication List    STOP taking these medications   mometasone-formoterol 100-5 MCG/ACT Aero Commonly known as:  DULERA     TAKE these medications   ACCU-CHEK AVIVA device Use as instructed   accu-chek soft touch lancets 1 each by Other route 2 (two) times daily. Use as instructed   albuterol 108 (90 Base) MCG/ACT inhaler Commonly known as:  PROVENTIL HFA;VENTOLIN HFA Inhale 2 puffs into the lungs every 6 (six) hours as needed for wheezing or shortness of breath.   albuterol (2.5 MG/3ML) 0.083% nebulizer solution Commonly known as:  PROVENTIL Take 3 mLs (2.5 mg total) by nebulization every 6 (six) hours as needed for wheezing or shortness of breath.   ARIPiprazole 5 MG tablet Commonly known as:  ABILIFY Take 1 tablet (5 mg total) by mouth daily with breakfast.   doxycycline 50 MG capsule Commonly known as:  VIBRAMYCIN Take 2 capsules (100 mg total) by mouth 2 (two) times daily for 5 days.   escitalopram 20 MG tablet Commonly known as:  LEXAPRO Take 1 tablet (20 mg total) by mouth daily.   Fluticasone-Umeclidin-Vilant 100-62.5-25 MCG/INH Aepb Inhale 1 puff into the lungs daily.   folic acid 1 MG tablet Commonly known as:  FOLVITE Take 1 tablet (1 mg total) by mouth daily.   glucose blood test strip 1 each by Other route as needed for other. Use as instructed   ipratropium-albuterol 0.5-2.5 (3) MG/3ML Soln Commonly known as:  DUONEB  Take 3 mLs by nebulization every 6 (six) hours.   LORazepam 1 MG tablet Commonly known as:  ATIVAN Take 1 tablet (1 mg total) by mouth every 8 (eight) hours as needed for up to 3 days for anxiety.   lovastatin 20 MG tablet Commonly known as:  MEVACOR Take 1 tablet (20 mg total) by mouth at bedtime.   metFORMIN 1000 MG tablet Commonly known as:  GLUCOPHAGE Take 1 tablet (1,000 mg total) by mouth 2 (two) times daily with a meal.    OXYGEN Inhale 3 L into the lungs continuous.   predniSONE 20 MG tablet Commonly known as:  DELTASONE Take 0.5 tablets (10 mg total) by mouth daily with breakfast. Prednisone 40 mg daily for 3 days followed by  Prednisone 20 mg daily for 3 days followed by  Prednisone 10 mg daily for What changed:    medication strength  additional instructions   tamsulosin 0.4 MG Caps capsule Commonly known as:  FLOMAX Take 1 capsule (0.4 mg total) by mouth daily.   thiamine 100 MG tablet Take 1 tablet (100 mg total) by mouth daily.       Allergies  Allergen Reactions  . Codeine Nausea And Vomiting    Consultations:  None.    Procedures/Studies: Dg Chest 2 View  Result Date: 11/12/2017 CLINICAL DATA:  COPD, shortness of breath EXAM: CHEST - 2 VIEW COMPARISON:  10/24/2017 FINDINGS: There is hyperinflation of the lungs compatible with COPD. Heart and mediastinal contours are within normal limits. No focal opacities or effusions. No acute bony abnormality. Old right rib fractures. IMPRESSION: COPD.  No active disease. Electronically Signed   By: Rolm Baptise M.D.   On: 11/12/2017 23:09   Dg Chest 2 View  Result Date: 10/24/2017 CLINICAL DATA:  Shortness of breath for few days. Chest pressure with exertion. History of emphysema. EXAM: CHEST - 2 VIEW COMPARISON:  Chest radiograph September 28, 2017. FINDINGS: Cardiomediastinal silhouette is normal. No pleural effusions or focal consolidations. Trachea projects midline and there is no pneumothorax. Soft tissue planes and included osseous structures are non-suspicious. Old RIGHT rib fractures. IMPRESSION: Similar hyperinflation without focal consolidation. Electronically Signed   By: Elon Alas M.D.   On: 10/24/2017 15:37       Subjective: Feeling better.   Discharge Exam: Vitals:   11/14/17 0813 11/14/17 1115  BP:  117/78  Pulse:  82  Resp:  18  Temp:  98.1 F (36.7 C)  SpO2: 92% 97%   Vitals:   11/14/17 0419 11/14/17  0740 11/14/17 0813 11/14/17 1115  BP: 129/72 116/79  117/78  Pulse: 86 72  82  Resp: 18   18  Temp: 97.6 F (36.4 C) 98.2 F (36.8 C)  98.1 F (36.7 C)  TempSrc: Oral Oral  Oral  SpO2: 95% 95% 92% 97%  Weight:      Height:        General: Pt is alert, awake, not in acute distress Cardiovascular: RRR, S1/S2 +, no rubs, no gallops Respiratory: wheezing improved bdominal: Soft, NT, ND, bowel sounds + Extremities: no edema, no cyanosis    The results of significant diagnostics from this hospitalization (including imaging, microbiology, ancillary and laboratory) are listed below for reference.     Microbiology: No results found for this or any previous visit (from the past 240 hour(s)).   Labs: BNP (last 3 results) Recent Labs    06/13/17 1003 08/24/17 1838 09/10/17 2215  BNP 33.8 34.7 02.7   Basic Metabolic Panel:  Recent Labs  Lab 11/12/17 2250  NA 141  K 3.9  CL 101  CO2 29  GLUCOSE 95  BUN 13  CREATININE 0.86  CALCIUM 8.9   Liver Function Tests: Recent Labs  Lab 11/12/17 2250  AST 22  ALT 14  ALKPHOS 62  BILITOT 0.7  PROT 5.9*  ALBUMIN 3.6   No results for input(s): LIPASE, AMYLASE in the last 168 hours. No results for input(s): AMMONIA in the last 168 hours. CBC: Recent Labs  Lab 11/12/17 2250  WBC 8.5  NEUTROABS 5.5  HGB 12.5*  HCT 40.9  MCV 93.6  PLT 243   Cardiac Enzymes: No results for input(s): CKTOTAL, CKMB, CKMBINDEX, TROPONINI in the last 168 hours. BNP: Invalid input(s): POCBNP CBG: Recent Labs  Lab 11/13/17 1711 11/13/17 2011 11/14/17 0023 11/14/17 0421 11/14/17 0828  GLUCAP 164* 184* 231* 162* 213*   D-Dimer No results for input(s): DDIMER in the last 72 hours. Hgb A1c No results for input(s): HGBA1C in the last 72 hours. Lipid Profile No results for input(s): CHOL, HDL, LDLCALC, TRIG, CHOLHDL, LDLDIRECT in the last 72 hours. Thyroid function studies No results for input(s): TSH, T4TOTAL, T3FREE, THYROIDAB in the  last 72 hours.  Invalid input(s): FREET3 Anemia work up No results for input(s): VITAMINB12, FOLATE, FERRITIN, TIBC, IRON, RETICCTPCT in the last 72 hours. Urinalysis    Component Value Date/Time   COLORURINE YELLOW 06/13/2017 0203   APPEARANCEUR CLEAR 06/13/2017 0203   LABSPEC 1.018 06/13/2017 0203   PHURINE 6.0 06/13/2017 0203   GLUCOSEU 50 (A) 06/13/2017 0203   HGBUR NEGATIVE 06/13/2017 0203   BILIRUBINUR NEGATIVE 06/13/2017 0203   KETONESUR NEGATIVE 06/13/2017 0203   PROTEINUR NEGATIVE 06/13/2017 0203   UROBILINOGEN 1.0 01/09/2013 1843   NITRITE NEGATIVE 06/13/2017 0203   LEUKOCYTESUR NEGATIVE 06/13/2017 0203   Sepsis Labs Invalid input(s): PROCALCITONIN,  WBC,  LACTICIDVEN Microbiology No results found for this or any previous visit (from the past 240 hour(s)).   Time coordinating discharge: 35 minutes  SIGNED:   Hosie Poisson, MD  [facility] 11/14/2017, 11:37 AM Pager   If 7PM-7AM, please contact night-coverage [email or web address] Password [email or web address]

## 2017-11-14 NOTE — NC FL2 (Signed)
[place] LEVEL OF CARE SCREENING TOOL     IDENTIFICATION  Patient Name: Taylor [name] Birthdate: [birth year] Sex: male Admission Date (Current Location): 11/12/2017  [facility] and [place] Number:  Herbalist and Address:  [facility], [place] [address], [place], [place] [address]      Provider Number: [id number]  Attending Physician Name and Address:  Hosie Poisson, MD  Relative Name and Phone Number:       Current Level of Care: Hospital Recommended Level of Care: [place] Prior Approval Number:    Date Approved/Denied:   PASRR Number: Pending  Discharge Plan: SNF    Current Diagnoses: Patient Active Problem List   Diagnosis Date Noted  . Essential hypertension 11/13/2017  . Type 2 diabetes mellitus ([place]) 10/24/2017  . Adjustment disorder with anxiety   . Sexually offensive behavior/Sex Offender (Min[place] male child) 09/23/2017  . Hypoxic Respiratory failure, acute and chronic ([place]) 09/22/2017  . Palliative care by specialist   . DNR (do not resuscitate)   . Chronic respiratory failure with hypoxia and hypercapnia ([place]) 09/14/2017  . Acute on chronic respiratory failure with hypoxia ([place]) 09/11/2017  . Anxiety and depression   . Prostate cancer ([place]) 09/04/2017  . Acute respiratory failure with hypercapnia ([place])   . SOB (shortness of breath)   . Malnutrition of moderate degree 06/14/2017  . Hyperglycemia 06/08/2017  . Constipation 06/08/2017  . SIRS (systemic inflammatory response syndrome) ([place]) 05/21/2017  . Tobacco abuse 05/13/2017  . HLD (hyperlipidemia) 05/13/2017  . Leukocytosis 04/06/2017  . Adjustment disorder with depressed mood 03/28/2017  . Normocytic normochromic anemia 03/24/2017  . COPD with acute exacerbation ([place]) 10/22/2016  . Alcohol abuse 01/09/2013  . COPD exacerbation ([place]) 01/09/2013  . Smoker 12/16/2012  . Inguinal hernia unilateral, non-recurrent, right 05/01/2012  .  Dyspepsia 02/05/2012  . Bipolar 1 disorder ([place]) 02/05/2012  . 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 COPD  GOLD III 02/03/2012  . Thrombocytopenia ([place]) 02/03/2012  . Depression   . Colon cancer, sigmoid 04/08/2011    Orientation RESPIRATION BLADDER Height & Weight     Self, Time, Situation, Place  O2([place] 3L) Continent Weight: 169 lb 12.1 oz (77 kg) Height:  5\' 11"  (180.3 cm)  BEHAVIORAL SYMPTOMS/MOOD NEUROLOGICAL BOWEL NUTRITION STATUS      Continent Diet(heart healthy)  AMBULATORY STATUS COMMUNICATION OF NEEDS Skin   Limited Assist Verbally Normal                       Personal Care Assistance Level of Assistance  Bathing, Feeding, Dressing Bathing Assistance: Limited assistance Feeding assistance: Independent Dressing Assistance: Limited assistance     Functional Limitations Info  Sight, Hearing, Speech Sight Info: Impaired Hearing Info: Adequate Speech Info: Adequate    SPECIAL CARE FACTORS FREQUENCY  PT (By licensed PT)     PT Frequency: 3x/wk              Contractures Contractures Info: Not present    Additional Factors Info  Code Status, Allergies, Psychotropic, Insulin Sliding Scale Code Status Info: Full Allergies Info: Codeine Psychotropic Info: Abilify 5mg  daily at breakfast; Lexapro 20mg  daily Insulin Sliding Scale Info: novolog every 4 hours       Current Medications (11/14/2017):  This is the current hospital active medication list Current Facility-Administered Medications  Medication Dose Route Frequency Provider Last Rate Last Dose  . ARIPiprazole (ABILIFY) tablet 5 mg  5 mg Oral Q  breakfast Opyd, Ilene Qua, MD   5 mg at 11/14/17 1018  . ceFEPIme (MAXIPIME) 1 g in sodium chloride 0.9 % 100 mL IVPB  1 g Intravenous Q8H Opyd, Ilene Qua, MD 200 mL/hr at 11/14/17 0031 1 g at 11/14/17 0031  . enoxaparin (LOVENOX) injection 40 mg  40 mg Subcutaneous Q24H Opyd, Ilene Qua, MD   40 mg at 11/13/17 1714  . escitalopram (LEXAPRO)  tablet 20 mg  20 mg Oral Daily Opyd, Ilene Qua, MD   20 mg at 11/14/17 1018  . fluticasone furoate-vilanterol (BREO ELLIPTA) 100-25 MCG/INH 1 puff  1 puff Inhalation Daily Opyd, Ilene Qua, MD   1 puff at 11/14/17 0811   And  . umeclidinium bromide (INCRUSE ELLIPTA) 62.5 MCG/INH 1 puff  1 puff Inhalation Daily Opyd, Ilene Qua, MD   1 puff at 11/14/17 0811  . folic acid (FOLVITE) tablet 1 mg  1 mg Oral Daily Opyd, Timothy S, MD      . insulin aspart (novoLOG) injection 0-9 Units  0-9 Units Subcutaneous Q4H Opyd, Ilene Qua, MD   3 Units at 11/14/17 1020  . ipratropium-albuterol (DUONEB) 0.5-2.5 (3) MG/3ML nebulizer solution 3 mL  3 mL Nebulization Q6H Hosie Poisson, MD   3 mL at 11/14/17 0811  . LORazepam (ATIVAN) tablet 1 mg  1 mg Oral Q8H PRN Hosie Poisson, MD   1 mg at 11/13/17 2145  . pravastatin (PRAVACHOL) tablet 20 mg  20 mg Oral q1800 Opyd, Ilene Qua, MD   20 mg at 11/13/17 1715  . predniSONE (DELTASONE) tablet 40 mg  40 mg Oral Q breakfast Opyd, Ilene Qua, MD      . tamsulosin (FLOMAX) capsule 0.4 mg  0.4 mg Oral Daily Opyd, Ilene Qua, MD   0.4 mg at 11/14/17 1018  . thiamine (VITAMIN B-1) tablet 100 mg  100 mg Oral Daily Opyd, Ilene Qua, MD         Discharge Medications: Please see discharge summary for a list of discharge medications.  Relevant Imaging Results:  Relevant Lab Results:   Additional Information SS#: [id number]  Geralynn Ochs, LCSW

## 2017-11-14 NOTE — Progress Notes (Signed)
CSW alerted by [facility] that patient has been active with [employer], but they will be releasing him on 11/15/17. Per Nurse with [employer], they have been trying to get him placed in [place]. CSW completed referral, and has been trying to get in touch with Admissions at [facility]; they have not returned CSW call. CSW reached out to other facilities in the area, and patient has been declined as he is a registered sex offender. Per chart review, CSW contacted previous CSW who had worked with patient on recent admission, and there were no facilities who would accept patient due to his history on his previous admission.   CSW alerted MD. Patient may not be able to admit to SNF if there are no facilities who will take him.   CSW to follow to see if Mendel Corning will return call before the end of the day today.  Laveda Abbe, [place] Clinical Social Worker [phone]

## 2017-11-14 NOTE — Care Management Note (Signed)
Case Management Note  Patient Details  Name: Taylor [name] MRN: [id number] Date of Birth: Jul 28, 1955  Subjective/Objective:   Pt in with COPD exacerbation. He is from [place] [address] on [address] under the [employer]. Per the SW with [employer] he can not return to the motel with the project paying for the room. They had been working to get him into [employer].                  Action/Plan: CSW updated on [employer]  CM spoke to Santiago Glad, [place] with [employer] and the patient will have to be d/ced with self care if cant go to [facility]. They cant renew his coverage.  Pt will need a cab to [employer] if doesn't d/c to [facility]. Please call [facility] RN: Jackelyn Poling [phone] for assistance with obtaining his belongings if goes to [facility].   Expected Discharge Date:                  Expected Discharge Plan:  Skilled Nursing Facility  In-House Referral:  Clinical Social Work  Discharge planning Services  CM Consult  Post Acute Care Choice:    Choice offered to:     DME Arranged:    DME Agency:     HH Arranged:    [place] Agency:     Status of Service:  In process, will continue to follow  If discussed at Long Length of Stay Meetings, dates discussed:    Additional Comments:  Pollie Friar, RN 11/14/2017, 4:10 PM

## 2017-11-14 NOTE — Progress Notes (Signed)
Physical Therapy Treatment Patient Details Name: Taylor [name] MRN: [id number] DOB: 1955/05/12 Today's Date: 11/14/2017    History of Present Illness Pt is a 62 y/o male admitted secondary to SOB and found to have a COPD exacerbation. PMH including but not limited to Bipolar, COPD and recent diagnosis of prostate cancer.    PT Comments    Pt was able to walk with PT a longer trip with controlled pace and effort to use pursed lip breathing afterward.  He is understanding the breathing condition as he was on O2 previous to this hospitalization.   He will follow up with HHPT as prior to this stay, with ck of O2 sats with all effort.  Follow acutely for strengthening and endurance until dc.   Follow Up Recommendations  Home health PT     Equipment Recommendations  None recommended by PT    Recommendations for Other Services       Precautions / Restrictions Precautions Precautions: Fall Precaution Comments: O2 dependent Restrictions Weight Bearing Restrictions: No    Mobility  Bed Mobility Overal bed mobility: Needs Assistance Bed Mobility: Supine to Sit;Sit to Supine     Supine to sit: Modified independent (Device/Increase time) Sit to supine: Modified independent (Device/Increase time)      Transfers Overall transfer level: Needs assistance Equipment used: None Transfers: Sit to/from Stand Sit to Stand: Supervision         General transfer comment: supervision for lines and to assist tank of O2  Ambulation/Gait Ambulation/Gait assistance: Min guard Gait Distance (Feet): 180 Feet Assistive device: None Gait Pattern/deviations: Step-through pattern;Decreased stride length;Wide base of support Gait velocity: decreased Gait velocity interpretation: <1.31 ft/sec, indicative of household ambulator General Gait Details: on 3L O2 with SOB at end but is expecting to return to motel when he leaves and unsure of his assistance, using O2 tank   Stairs              Wheelchair Mobility    Modified Rankin (Stroke Patients Only)       Balance Overall balance assessment: Needs assistance Sitting-balance support: Feet supported Sitting balance-Leahy Scale: Good     Standing balance support: No upper extremity supported Standing balance-Leahy Scale: Fair                              Cognition Arousal/Alertness: Awake/alert Behavior During Therapy: WFL for tasks assessed/performed Overall Cognitive Status: Within Functional Limits for tasks assessed                                 General Comments: not sure of follow up with home and PT services      Exercises      General Comments        Pertinent Vitals/Pain Pain Assessment: No/denies pain    Home Living                      Prior Function            PT Goals (current goals can now be found in the care plan section) Acute Rehab PT Goals Patient Stated Goal: get home Progress towards PT goals: Progressing toward goals(SOB with progression)    Frequency    Min 3X/week      PT Plan Current plan remains appropriate    Co-evaluation  AM-PAC PT "6 Clicks" Daily Activity  Outcome Measure  Difficulty turning over in bed (including adjusting bedclothes, sheets and blankets)?: None Difficulty moving from lying on back to sitting on the side of the bed? : None Difficulty sitting down on and standing up from a chair with arms (e.g., wheelchair, bedside commode, etc,.)?: A Little Help needed moving to and from a bed to chair (including a wheelchair)?: A Little Help needed walking in hospital room?: A Little Help needed climbing 3-5 steps with a railing? : A Little 6 Click Score: 20    End of Session Equipment Utilized During Treatment: Gait belt;Oxygen Activity Tolerance: Patient tolerated treatment well;Patient limited by fatigue Patient left: in bed;with call bell/phone within reach;with bed alarm set Nurse  Communication: Mobility status PT Visit Diagnosis: Other abnormalities of gait and mobility (R26.89)     Time: 2956-2130 PT Time Calculation (min) (ACUTE ONLY): 25 min  Charges:  $Gait Training: 8-22 mins $Therapeutic Activity: 8-22 mins                    Ramond Dial 11/14/2017, 10:47 AM  Mee Hives, PT MS Acute Rehab Dept. Number: [place]

## 2017-11-15 LAB — GLUCOSE, CAPILLARY
GLUCOSE-CAPILLARY: 100 mg/dL — AB (ref 70–99)
GLUCOSE-CAPILLARY: 225 mg/dL — AB (ref 70–99)
Glucose-Capillary: 106 mg/dL — ABNORMAL HIGH (ref 70–99)
Glucose-Capillary: 116 mg/dL — ABNORMAL HIGH (ref 70–99)

## 2017-11-15 NOTE — Care Management (Signed)
[facility] to provide portable tank to patient to return to [facility] in cab.  Tank delivered to room.

## 2017-11-15 NOTE — Progress Notes (Signed)
P.t. Received discharge instructions and received prescriptions for pharmacist; received o2 tank; received voucher for taxi and bus passes. Instructions to return back to [facility] and to follow up with case manager outside of hospital on Monday.  P.t. Taken to main entrance via wheelchair for discharge.

## 2017-11-15 NOTE — Social Work (Addendum)
CSW has still not received any offers from SNFs regarding placement.  Provided bedside RN with cab voucher to return to [facility], bus passes for future and packet of crisis resources. Pt states to bedside RN that he can pay for a few nights further at [facility].   CSW signing off. Please consult if any additional needs arise.  Alexander [place], [place] Work

## 2017-11-18 ENCOUNTER — Ambulatory Visit (INDEPENDENT_AMBULATORY_CARE_PROVIDER_SITE_OTHER): Payer: Medicaid Other | Admitting: Internal Medicine

## 2017-11-18 ENCOUNTER — Encounter: Payer: Self-pay | Admitting: Internal Medicine

## 2017-11-18 VITALS — BP 120/80 | HR 83 | Ht 71.0 in | Wt 169.6 lb

## 2017-11-18 DIAGNOSIS — J449 Chronic obstructive pulmonary disease, unspecified: Secondary | ICD-10-CM

## 2017-11-18 DIAGNOSIS — J9611 Chronic respiratory failure with hypoxia: Secondary | ICD-10-CM

## 2017-11-18 MED ORDER — FLUTICASONE-UMECLIDIN-VILANT 100-62.5-25 MCG/INH IN AEPB: 1.0000 | INHALATION_SPRAY | Freq: Every day | RESPIRATORY_TRACT | 0 refills | Status: DC

## 2017-11-18 MED ORDER — ALBUTEROL SULFATE (2.5 MG/3ML) 0.083% IN NEBU: 2.5000 mg | INHALATION_SOLUTION | RESPIRATORY_TRACT | 12 refills | Status: DC | PRN

## 2017-11-18 MED ORDER — FLUTICASONE-UMECLIDIN-VILANT 100-62.5-25 MCG/INH IN AEPB: 1.0000 | INHALATION_SPRAY | RESPIRATORY_TRACT | 11 refills | Status: DC

## 2017-11-18 NOTE — Progress Notes (Signed)
Subjective:    Patient ID: Taylor [name], male    DOB: 1955-08-11 MRN: [id number]    Brief patient profile:  62  yowm quit smoking 2015  sinus congestion with spring just in 2014 took a few clariton referred by ER for sob after evaluation there for ? Ihd> ruled out clinically but GOLD IV COPD documented 12/15/2012    History of Present Illness   Previous eval at [facility] for sob Jan 2014 Admit date: 02/03/2012  Discharge date: 02/06/2012  Discharge Diagnoses:  Principal Problem:  *COPD (chronic obstructive pulmonary disease)  Active Problems:  Colon cancer, sigmoid  Thrombocytopenia  Hyperglycemia  Dyspepsia  Bipolar 1 disorder  Depression      06/17/2013 f/u ov/Ajmal Kathan re: GOLD IV/ still smoking / supposed to be on spiriva/dulera  Chief Complaint  Patient presents with  . Acute Visit    Pt c/o increased SOB and chest tightness for the past month.  He is using proair approx 4 times per day.  He also c/o prod cough with minimal clear sputum.   confused with meds by name/ most of his cough is am worse in cold weather  rec Prednisone 10 mg take  4 each am x 2 days,   2 each am x 2 days,  1 each am x 2 days and stop  Dulera 200 Take 2 puffs first thing in am and then another 2 puffs about 12 hours later.  Only use your albuterol (proair)as a rescue medication  Pantoprazole (protonix) 40 mg   Take 30-60 min before first meal of the day and Pepcid 20 mg one bedtime until return to office GERD diet            06/05/2016 acute extended ov/Nadim Malia re:  Copd III/ quit smoking 2015 maint on dulera /spiriva  Chief Complaint  Patient presents with  . Breathing Issues    Increased SOB for the past 2 weeks. Has been in the ER twice for this. States that his current inhalers are not working. Feels like he is about to pass out at times.   One month prior to OV  "ok" on dulera / spiriva  But still daily hfa albuterol /albuterol neb and has needed this for at least 10 years Gradually worse  sob x 2 weeks to point even sob at rest Wakes up and immediately grabs for inhaler x 2 weeks prednisone helps transiently each time he takes it  rec Prednisone 10 mg take 2 daily with breakfast until better then 1 daily Prilosec Take two of them  30-60 min before first meal of the day and pepcid 20 mg at bedtime until return GERD diet  Plan A = Automatic = first thing in am Dulera 200 x 2 and spiriva x 2 then the duelra  200 x 2  At 12 hours later Plan B = Backup Only use your albuterol as a rescue medication   Plan C = Crisis - only use your albuterol nebulizer if you first try Plan B    Admit date: 11/12/2017 Discharge date: 11/14/2017  Admitted From: HOME.  Disposition: SNF  Recommendations for Outpatient Follow-up:  1. Follow up with PCP in 1-2 weeks 2. Please obtain BMP/CBC in one week Please follow up with Dr Melvyn Novas next week as scheduled Recommend follow up with [facility] next month.   Discharge Condition:stable.  CODE STATUS: full code.  Diet recommendation: Heart Healthy  Brief/Interim Summary: Taylor Bostock Hendersonis a 62 y.o.malewith medical history significant forsteroid-dependent COPD,  depression with anxiety, type 2 diabetes mellitus, and recent diagnosis of prostate cancer, now presenting to the emergency department for evaluation of increased shortness of breath and wheezing. He was admitted for copd exacerbation.   Discharge Diagnoses:  Principal Problem:   COPD with acute exacerbation ([place]) Active Problems:   Prostate cancer ([place])   Anxiety and depression   Adjustment disorder with anxiety   Type 2 diabetes mellitus ([place])   Essential hypertension   Acute copd exacerbation;  Admitted for IV steroids. Wheezing has improved. breathing better, . Still requiring at 3l it of [place] oxygen.  Will transition him to steroid taper and duo nebs on discharge.    Hypertension: well controlled.    Tremors:  Unclear if resting tremors or from the  albuterol.  Pt is not sure if he tried inderal in the past, but he says ativan works much better for his tremors.  Ordered Ativan for 3 days . Recommend outpatient follow upw ith PCP/ Dr Melvyn Novas    Diabetes mellitus: Continue home meds.   Depression with anxiety: Resume lexapro and abilify   Prostate cancer:  Follow up with [facility] as scheduled next month.        11/18/2017  f/u ov/[name] re: post hosp f/u - re-establish care  Chief Complaint  Patient presents with  . Hospitalization Follow-up    Hospital 11/13/2017, currently on prednisone. Antibiotic was prescribed, patient has not picked up from pharmacy. States his breathing is better since hospital visit. His sugars have been running high with prednisone.   Breo this am   instead of trelegy  - not apparent problem with trelegy not clar why changed to breo ? hosp/insurance restrictions?  - denies bladder problem while on lama ? If hx accurate  02 2lpm 24/7 Still using alb q 6 h despite rx with maint breo  Doe  = MMRC4  = sob if tries to leave home or while getting dressed    No obvious day to day or daytime variability or assoc excess/ purulent sputum or mucus plugs or hemoptysis or cp or chest tightness, subjective wheeze or overt sinus or hb symptoms.   Sleeping flat  without nocturnal  or early am exacerbation  of respiratory  c/o's or need for noct saba. Also denies any obvious fluctuation of symptoms with weather or environmental changes or other aggravating or alleviating factors except as outlined above   No unusual exposure hx or h/o childhood pna/ asthma or knowledge of premature birth.  Current Allergies, Complete Past Medical History, Past Surgical History, Family History, and Social History were reviewed in [employer] record.  ROS  The following are not active complaints unless bolded Hoarseness, sore throat, dysphagia, dental problems, itching, sneezing,  nasal congestion or  discharge of excess mucus or purulent secretions, ear ache,   fever, chills, sweats, unintended wt loss or wt gain, classically pleuritic or exertional cp,  orthopnea pnd or arm/hand swelling  or leg swelling, presyncope, palpitations, abdominal pain, anorexia, nausea, vomiting, diarrhea  or change in bowel habits or change in bladder habits, change in stools or change in urine, dysuria, hematuria,  rash, arthralgias, visual complaints, headache, numbness, weakness or ataxia or problems with walking or coordination,  change in mood or  memory.         Meds:  Unable to verify what he's taking other than breo          Objective:   Physical Exam   amb wm  nad  on 02   11/18/2017     169  06/05/2016       177 06/17/2013       178  > 08/09/2013 184 >  11/09/2013  176  Wt Readings from Last 3 Encounters:  02/22/13 186 lb (84.369 kg)  01/25/13 180 lb 9.6 oz (81.92 kg)  01/18/13 156 lb 15.5 oz (71.2 kg)       Vital signs reviewed - Note on arrival 02 sats  95% on 2lpm      HEENT: Partial top and bottom dentures/ nl oropharynx. Nl external ear canals without cough reflex -  Mod bilateral non-specific turbinate edema     NECK :  without JVD/Nodes/TM/ nl carotid upstrokes bilaterally   LUNGS: no acc muscle use,  Mild barrel  contour chest wall with bilateral  Distant bs s audible wheeze and  without cough on insp or exp maneuver and mild  Hyperresonant  to  percussion bilaterally     CV:  RRR  no s3 or murmur or increase in P2, and no edema   ABD:  soft and nontender with pos mid/late insp Hoover's  in the supine position. No bruits or organomegaly appreciated, bowel sounds nl  MS:   Nl gait/  ext warm without deformities, calf tenderness, cyanosis - Mild/mod  clubbing  No obvious joint restrictions   SKIN: warm and dry without lesions    NEURO:  alert, approp, nl sensorium with  no motor or cerebellar deficits apparent.        I personally reviewed images and agree with radiology  impression as follows:  CXR:   11/12/17 COPD.  No active disease.        Assessment & Plan:

## 2017-11-18 NOTE — Patient Instructions (Addendum)
Plan A = Automatic = Trelegy one click first thing am and take two good drags (if bladder problems worsen you need to change back to [place] B = Backup Only use your albuterol(proair)  as a rescue medication to be used if you can't catch your breath by resting or doing a relaxed purse lip breathing pattern.  - The less you use it, the better it will work when you need it. - Ok to use the inhaler up to 2 puffs  every 4 hours if you must but call for appointment if use goes up over your usual need - Don't leave home without it !!  (think of it like the spare tire for your car)    Plan C = Crisis - only use your albuterol nebulizer (2.5mg  = 34ml of solution)  if you first try Plan B and it fails to help > ok to use the nebulizer up to every 4 hours but if start needing it regularly call for immediate appointment    Please schedule a follow up office visit in 4 weeks, sooner if needed  with all medications /inhalers/ solutions in hand so we can verify exactly what you are taking. This includes all medications from all doctors and over the counters  - pfts on return

## 2017-11-19 ENCOUNTER — Encounter: Payer: Self-pay | Admitting: Internal Medicine

## 2017-11-19 DIAGNOSIS — J9611 Chronic respiratory failure with hypoxia: Secondary | ICD-10-CM | POA: Insufficient documentation

## 2017-11-19 NOTE — Assessment & Plan Note (Signed)
-   09/17/2012  Walked RA x 3 laps @ 185 ft each stopped due to  End of study, no desat - Spirometry 09/17/2012 FEV1  0.80 (21%) ratio 52  - PFT's 12/15/2012  FEV1  0.89 (24%) with Ratio 38 p am saba and DLCO 44% corrects to 68%  - PFTs 12/07/13      FEV1   1.18 (32%)  Ratio 40  And no change p saba DLCO 50 corrects to 68% took neither spiriva nor dulera before pfts - Alpha one screen 12/15/2012 >  MM -   06/05/2016 p extensive coaching HFA effectiveness =    90% from a baseline of 50% (Ti too short) - 06/05/2016 changed spiriva to respimat x one sample  And started daily prednisone  10/17/2017 Three hospital admissions for COPD exacerbation. Not taking dulera or spirivia currently. Can't afford medication. Changed maintenance inhaler to Trelegy to enhance compliance and given patient assistance   - 11/18/2017  After extensive coaching inhaler device,  effectiveness =    90% with elipta     Group D in terms of symptom/risk and laba/lama/ICS  therefore appropriate rx at this point unless having trouble with bladder related to lama/ advised.

## 2017-11-19 NOTE — Assessment & Plan Note (Addendum)
No longer hypercarbic by last HC03 level and well compensated on 2lpm so  Adequate control on present rx, reviewed in detail with pt > no change in rx needed     Will see him back in 6 weeks with pfts and with all meds in hand using a trust but verify approach to confirm accurate Medication  Reconciliation The principal here is that until we are certain that the  patients are doing what we've asked, it makes no sense to ask them to do more.     I had an extended discussion with the patient reviewing all relevant studies completed to date including extensive hosp records and  lasting 25 minutes of a 40  minute transtion of care office visit addressing non-specific but potentially very serious refractory respiratory symptoms of uncertain and potentially multiple  etiologies.   Each maintenance medication was reviewed in detail including most importantly the difference between maintenance and prns and under what circumstances the prns are to be triggered using an action plan format that is not reflected in the computer generated alphabetically organized AVS.    Please see AVS for specific instructions unique to this office visit that I personally wrote and verbalized to the the pt in detail and then reviewed with pt  by my nurse highlighting any changes in therapy/plan of care  recommended at today's visit.      See device teaching which extended face to face time for this visit

## 2017-11-25 ENCOUNTER — Encounter (HOSPITAL_COMMUNITY): Payer: Self-pay | Admitting: Emergency Medicine

## 2017-11-25 ENCOUNTER — Emergency Department (HOSPITAL_COMMUNITY)
Admission: EM | Admit: 2017-11-25 | Discharge: 2017-11-26 | Disposition: A | Discharge status: Home / Self Care | Payer: Medicaid Other | Attending: Emergency Medicine | Admitting: Emergency Medicine

## 2017-11-25 DIAGNOSIS — Z79899 Other long term (current) drug therapy: Secondary | ICD-10-CM | POA: Diagnosis not present

## 2017-11-25 DIAGNOSIS — R519 Headache, unspecified: Secondary | ICD-10-CM

## 2017-11-25 DIAGNOSIS — Z85038 Personal history of other malignant neoplasm of large intestine: Secondary | ICD-10-CM | POA: Diagnosis not present

## 2017-11-25 DIAGNOSIS — Z87891 Personal history of nicotine dependence: Secondary | ICD-10-CM | POA: Diagnosis not present

## 2017-11-25 DIAGNOSIS — Z7984 Long term (current) use of oral hypoglycemic drugs: Secondary | ICD-10-CM | POA: Insufficient documentation

## 2017-11-25 DIAGNOSIS — G4489 Other headache syndrome: Secondary | ICD-10-CM | POA: Insufficient documentation

## 2017-11-25 DIAGNOSIS — J449 Chronic obstructive pulmonary disease, unspecified: Secondary | ICD-10-CM | POA: Diagnosis not present

## 2017-11-25 DIAGNOSIS — R0600 Dyspnea, unspecified: Secondary | ICD-10-CM | POA: Diagnosis present

## 2017-11-25 DIAGNOSIS — R51 Headache: Secondary | ICD-10-CM

## 2017-11-25 MED ORDER — KETOROLAC TROMETHAMINE 15 MG/ML IJ SOLN
15.0000 mg | Freq: Once | INTRAMUSCULAR | Status: AC
  Administered 2017-11-25: 15 mg via INTRAVENOUS
  Filled 2017-11-25: qty 1

## 2017-11-25 MED ORDER — ESCITALOPRAM OXALATE 20 MG PO TABS: 20.0000 mg | ORAL_TABLET | Freq: Every day | ORAL | 1 refills | Status: DC

## 2017-11-25 MED ORDER — ARIPIPRAZOLE 5 MG PO TABS: 5.0000 mg | ORAL_TABLET | Freq: Every day | ORAL | 1 refills | Status: DC

## 2017-11-25 MED ORDER — DIPHENHYDRAMINE HCL 50 MG/ML IJ SOLN
12.5000 mg | Freq: Once | INTRAMUSCULAR | Status: AC
  Administered 2017-11-25: 12.5 mg via INTRAVENOUS
  Filled 2017-11-25: qty 1

## 2017-11-25 MED ORDER — METOCLOPRAMIDE HCL 5 MG/ML IJ SOLN
10.0000 mg | Freq: Once | INTRAMUSCULAR | Status: AC
  Administered 2017-11-25: 10 mg via INTRAVENOUS
  Filled 2017-11-25: qty 2

## 2017-11-25 NOTE — ED Triage Notes (Addendum)
Pt from home with c/o sinus headache with accompanying nausea and diarrhea. Pt also has c/o SOB and used his home neb prior to EMS arrival. Pt uses 3L [place] at baseline for COPD. Pt is a smoker. Pt reports improvement after breathing treatment.  Pt brought a tank from home that is a hospital tank. Pt has a concentrator and multiple empty tanks at his hotel room. Pt states he wants staff to fill up the tank and send him back home with it.    Pt has normal neuro exam and clear yet diminished lung sounds  116/82 HR 100, CBG 133, O2 100% 3L

## 2017-11-26 ENCOUNTER — Other Ambulatory Visit: Payer: Self-pay

## 2017-11-28 ENCOUNTER — Emergency Department (HOSPITAL_COMMUNITY): Payer: Medicaid Other

## 2017-11-28 ENCOUNTER — Other Ambulatory Visit: Payer: Self-pay

## 2017-11-28 ENCOUNTER — Inpatient Hospital Stay (HOSPITAL_COMMUNITY)
Admission: EM | Admit: 2017-11-28 | Discharge: 2017-11-30 | DRG: 190 | Disposition: A | Discharge status: Home / Self Care | Payer: Medicaid Other | Attending: Internal Medicine | Admitting: Internal Medicine

## 2017-11-28 ENCOUNTER — Inpatient Hospital Stay (HOSPITAL_COMMUNITY): Payer: Medicaid Other

## 2017-11-28 DIAGNOSIS — Z8249 Family history of ischemic heart disease and other diseases of the circulatory system: Secondary | ICD-10-CM

## 2017-11-28 DIAGNOSIS — Z7984 Long term (current) use of oral hypoglycemic drugs: Secondary | ICD-10-CM | POA: Diagnosis not present

## 2017-11-28 DIAGNOSIS — E0965 Drug or chemical induced diabetes mellitus with hyperglycemia: Secondary | ICD-10-CM | POA: Diagnosis present

## 2017-11-28 DIAGNOSIS — T380X5A Adverse effect of glucocorticoids and synthetic analogues, initial encounter: Secondary | ICD-10-CM

## 2017-11-28 DIAGNOSIS — C61 Malignant neoplasm of prostate: Secondary | ICD-10-CM | POA: Diagnosis present

## 2017-11-28 DIAGNOSIS — Z79899 Other long term (current) drug therapy: Secondary | ICD-10-CM | POA: Diagnosis not present

## 2017-11-28 DIAGNOSIS — J9622 Acute and chronic respiratory failure with hypercapnia: Secondary | ICD-10-CM | POA: Diagnosis present

## 2017-11-28 DIAGNOSIS — E785 Hyperlipidemia, unspecified: Secondary | ICD-10-CM | POA: Diagnosis present

## 2017-11-28 DIAGNOSIS — I10 Essential (primary) hypertension: Secondary | ICD-10-CM | POA: Diagnosis present

## 2017-11-28 DIAGNOSIS — Z9049 Acquired absence of other specified parts of digestive tract: Secondary | ICD-10-CM | POA: Diagnosis not present

## 2017-11-28 DIAGNOSIS — Z9981 Dependence on supplemental oxygen: Secondary | ICD-10-CM

## 2017-11-28 DIAGNOSIS — J441 Chronic obstructive pulmonary disease with (acute) exacerbation: Secondary | ICD-10-CM | POA: Diagnosis present

## 2017-11-28 DIAGNOSIS — Z66 Do not resuscitate: Secondary | ICD-10-CM | POA: Diagnosis present

## 2017-11-28 DIAGNOSIS — Z85038 Personal history of other malignant neoplasm of large intestine: Secondary | ICD-10-CM | POA: Diagnosis not present

## 2017-11-28 DIAGNOSIS — Z7952 Long term (current) use of systemic steroids: Secondary | ICD-10-CM

## 2017-11-28 DIAGNOSIS — E099 Drug or chemical induced diabetes mellitus without complications: Secondary | ICD-10-CM | POA: Diagnosis not present

## 2017-11-28 DIAGNOSIS — F419 Anxiety disorder, unspecified: Secondary | ICD-10-CM | POA: Diagnosis present

## 2017-11-28 DIAGNOSIS — F319 Bipolar disorder, unspecified: Secondary | ICD-10-CM | POA: Diagnosis present

## 2017-11-28 DIAGNOSIS — Z972 Presence of dental prosthetic device (complete) (partial): Secondary | ICD-10-CM

## 2017-11-28 DIAGNOSIS — Z87891 Personal history of nicotine dependence: Secondary | ICD-10-CM | POA: Diagnosis not present

## 2017-11-28 DIAGNOSIS — E119 Type 2 diabetes mellitus without complications: Secondary | ICD-10-CM

## 2017-11-28 DIAGNOSIS — Z8701 Personal history of pneumonia (recurrent): Secondary | ICD-10-CM

## 2017-11-28 DIAGNOSIS — J9602 Acute respiratory failure with hypercapnia: Secondary | ICD-10-CM | POA: Diagnosis not present

## 2017-11-28 DIAGNOSIS — Z801 Family history of malignant neoplasm of trachea, bronchus and lung: Secondary | ICD-10-CM

## 2017-11-28 DIAGNOSIS — J9621 Acute and chronic respiratory failure with hypoxia: Secondary | ICD-10-CM | POA: Diagnosis present

## 2017-11-28 DIAGNOSIS — R140 Abdominal distension (gaseous): Secondary | ICD-10-CM | POA: Diagnosis present

## 2017-11-28 DIAGNOSIS — Z885 Allergy status to narcotic agent status: Secondary | ICD-10-CM | POA: Diagnosis not present

## 2017-11-28 DIAGNOSIS — Z720 Tobacco use: Secondary | ICD-10-CM | POA: Diagnosis present

## 2017-11-28 LAB — CBC WITH DIFFERENTIAL/PLATELET
ABS IMMATURE GRANULOCYTES: 0.24 10*3/uL — AB (ref 0.00–0.07)
BASOS ABS: 0.1 10*3/uL (ref 0.0–0.1)
Basophils Relative: 1 %
Eosinophils Absolute: 0.2 10*3/uL (ref 0.0–0.5)
Eosinophils Relative: 2 %
HEMATOCRIT: 46.6 % (ref 39.0–52.0)
HEMOGLOBIN: 14.3 g/dL (ref 13.0–17.0)
Immature Granulocytes: 2 %
LYMPHS ABS: 4.1 10*3/uL — AB (ref 0.7–4.0)
Lymphocytes Relative: 29 %
MCH: 29.4 pg (ref 26.0–34.0)
MCHC: 30.7 g/dL (ref 30.0–36.0)
MCV: 95.9 fL (ref 80.0–100.0)
MONOS PCT: 7 %
Monocytes Absolute: 1 10*3/uL (ref 0.1–1.0)
NEUTROS ABS: 8.7 10*3/uL — AB (ref 1.7–7.7)
NEUTROS PCT: 59 %
NRBC: 0 % (ref 0.0–0.2)
Platelets: 274 10*3/uL (ref 150–400)
RBC: 4.86 MIL/uL (ref 4.22–5.81)
RDW: 13.3 % (ref 11.5–15.5)
WBC: 14.3 10*3/uL — ABNORMAL HIGH (ref 4.0–10.5)

## 2017-11-28 LAB — URINALYSIS, ROUTINE W REFLEX MICROSCOPIC
Bilirubin Urine: NEGATIVE
Glucose, UA: NEGATIVE mg/dL
HGB URINE DIPSTICK: NEGATIVE
Ketones, ur: 5 mg/dL — AB
Leukocytes, UA: NEGATIVE
Nitrite: NEGATIVE
Protein, ur: 100 mg/dL — AB
Specific Gravity, Urine: 1.023 (ref 1.005–1.030)
pH: 5 (ref 5.0–8.0)

## 2017-11-28 LAB — I-STAT ARTERIAL BLOOD GAS, ED
Acid-Base Excess: 6 mmol/L — ABNORMAL HIGH (ref 0.0–2.0)
Bicarbonate: 36.3 mmol/L — ABNORMAL HIGH (ref 20.0–28.0)
O2 Saturation: 78 %
PCO2 ART: 80.7 mmHg — AB (ref 32.0–48.0)
PH ART: 7.261 — AB (ref 7.350–7.450)
TCO2: 39 mmol/L — ABNORMAL HIGH (ref 22–32)
pO2, Arterial: 52 mmHg — ABNORMAL LOW (ref 83.0–108.0)

## 2017-11-28 LAB — I-STAT CHEM 8, ED
BUN: 24 mg/dL — ABNORMAL HIGH (ref 8–23)
CREATININE: 0.9 mg/dL (ref 0.61–1.24)
Calcium, Ion: 1.08 mmol/L — ABNORMAL LOW (ref 1.15–1.40)
Chloride: 98 mmol/L (ref 98–111)
Glucose, Bld: 177 mg/dL — ABNORMAL HIGH (ref 70–99)
HEMATOCRIT: 44 % (ref 39.0–52.0)
HEMOGLOBIN: 15 g/dL (ref 13.0–17.0)
Potassium: 4.8 mmol/L (ref 3.5–5.1)
Sodium: 135 mmol/L (ref 135–145)
TCO2: 32 mmol/L (ref 22–32)

## 2017-11-28 LAB — COMPREHENSIVE METABOLIC PANEL
ALT: 21 U/L (ref 0–44)
ANION GAP: 15 (ref 5–15)
AST: 23 U/L (ref 15–41)
Albumin: 4.4 g/dL (ref 3.5–5.0)
Alkaline Phosphatase: 55 U/L (ref 38–126)
BUN: 20 mg/dL (ref 8–23)
CO2: 26 mmol/L (ref 22–32)
Calcium: 9.1 mg/dL (ref 8.9–10.3)
Chloride: 95 mmol/L — ABNORMAL LOW (ref 98–111)
Creatinine, Ser: 0.97 mg/dL (ref 0.61–1.24)
GFR calc Af Amer: >60 mL/min (ref >60)
GFR calc non Af Amer: >60 mL/min (ref >60)
GLUCOSE: 175 mg/dL — AB (ref 70–99)
POTASSIUM: 4.7 mmol/L (ref 3.5–5.1)
Sodium: 136 mmol/L (ref 135–145)
Total Bilirubin: 0.7 mg/dL (ref 0.3–1.2)
Total Protein: 6.9 g/dL (ref 6.5–8.1)

## 2017-11-28 LAB — I-STAT CG4 LACTIC ACID, ED
Lactic Acid, Venous: 1.54 mmol/L (ref 0.5–1.9)
Lactic Acid, Venous: 1.32 mmol/L (ref 0.5–1.9)

## 2017-11-28 LAB — I-STAT TROPONIN, ED: Troponin i, poc: 0 ng/mL (ref 0.00–0.08)

## 2017-11-28 LAB — GLUCOSE, CAPILLARY: GLUCOSE-CAPILLARY: 164 mg/dL — AB (ref 70–99)

## 2017-11-28 LAB — ETHANOL: Alcohol, Ethyl (B): <10 mg/dL (ref <10)

## 2017-11-28 LAB — MRSA PCR SCREENING: MRSA BY PCR: NEGATIVE

## 2017-11-28 MED ORDER — NITROGLYCERIN IN D5W 200-5 MCG/ML-% IV SOLN: 0.0000 ug/min | Freq: Once | INTRAVENOUS | Status: DC

## 2017-11-28 MED ORDER — GUAIFENESIN ER 600 MG PO TB12
600.0000 mg | ORAL_TABLET | Freq: Two times a day (BID) | ORAL | Status: DC
  Administered 2017-11-28 – 2017-11-30 (×4): 600 mg via ORAL
  Filled 2017-11-28 (×4): qty 1

## 2017-11-28 MED ORDER — ONDANSETRON HCL 4 MG PO TABS: 4.0000 mg | ORAL_TABLET | Freq: Four times a day (QID) | ORAL | Status: DC | PRN

## 2017-11-28 MED ORDER — ARIPIPRAZOLE 5 MG PO TABS
5.0000 mg | ORAL_TABLET | Freq: Every day | ORAL | Status: DC
  Administered 2017-11-29 – 2017-11-30 (×2): 5 mg via ORAL
  Filled 2017-11-28 (×2): qty 1

## 2017-11-28 MED ORDER — ONDANSETRON HCL 4 MG/2ML IJ SOLN
4.0000 mg | Freq: Once | INTRAMUSCULAR | Status: AC
  Administered 2017-11-28: 4 mg via INTRAVENOUS
  Filled 2017-11-28: qty 2

## 2017-11-28 MED ORDER — ESCITALOPRAM OXALATE 20 MG PO TABS
20.0000 mg | ORAL_TABLET | Freq: Every day | ORAL | Status: DC
  Administered 2017-11-29 – 2017-11-30 (×2): 20 mg via ORAL
  Filled 2017-11-28 (×2): qty 1

## 2017-11-28 MED ORDER — LORAZEPAM 2 MG/ML IJ SOLN
1.0000 mg | Freq: Once | INTRAMUSCULAR | Status: AC
  Administered 2017-11-28: 1 mg via INTRAVENOUS
  Filled 2017-11-28: qty 1

## 2017-11-28 MED ORDER — FOLIC ACID 1 MG PO TABS
1.0000 mg | ORAL_TABLET | Freq: Every day | ORAL | Status: DC
  Administered 2017-11-29 – 2017-11-30 (×2): 1 mg via ORAL
  Filled 2017-11-28 (×2): qty 1

## 2017-11-28 MED ORDER — VITAMIN B-1 100 MG PO TABS
100.0000 mg | ORAL_TABLET | Freq: Every day | ORAL | Status: DC
  Administered 2017-11-29 – 2017-11-30 (×2): 100 mg via ORAL
  Filled 2017-11-28 (×3): qty 1

## 2017-11-28 MED ORDER — METHYLPREDNISOLONE SODIUM SUCC 40 MG IJ SOLR
40.0000 mg | Freq: Two times a day (BID) | INTRAMUSCULAR | Status: AC
  Administered 2017-11-28 – 2017-11-29 (×2): 40 mg via INTRAVENOUS
  Filled 2017-11-28 (×2): qty 1

## 2017-11-28 MED ORDER — ALPRAZOLAM 0.5 MG PO TABS
0.5000 mg | ORAL_TABLET | Freq: Two times a day (BID) | ORAL | Status: DC | PRN
  Administered 2017-11-28 – 2017-11-30 (×3): 0.5 mg via ORAL
  Filled 2017-11-28 (×3): qty 1

## 2017-11-28 MED ORDER — ACETAMINOPHEN 650 MG RE SUPP: 650.0000 mg | Freq: Four times a day (QID) | RECTAL | Status: DC | PRN

## 2017-11-28 MED ORDER — INSULIN ASPART 100 UNIT/ML ~~LOC~~ SOLN
0.0000 [IU] | Freq: Three times a day (TID) | SUBCUTANEOUS | Status: DC
  Administered 2017-11-29 (×2): 3 [IU] via SUBCUTANEOUS
  Administered 2017-11-30: 2 [IU] via SUBCUTANEOUS

## 2017-11-28 MED ORDER — SODIUM CHLORIDE 0.9 % IV SOLN
INTRAVENOUS | Status: AC
  Administered 2017-11-28: via INTRAVENOUS

## 2017-11-28 MED ORDER — ONDANSETRON HCL 4 MG/2ML IJ SOLN: 4.0000 mg | Freq: Four times a day (QID) | INTRAMUSCULAR | Status: DC | PRN

## 2017-11-28 MED ORDER — HYDRALAZINE HCL 20 MG/ML IJ SOLN
10.0000 mg | Freq: Once | INTRAMUSCULAR | Status: DC
  Filled 2017-11-28: qty 1

## 2017-11-28 MED ORDER — PRAVASTATIN SODIUM 20 MG PO TABS
10.0000 mg | ORAL_TABLET | Freq: Every day | ORAL | Status: DC
  Administered 2017-11-29: 10 mg via ORAL
  Filled 2017-11-28: qty 1

## 2017-11-28 MED ORDER — INSULIN ASPART 100 UNIT/ML ~~LOC~~ SOLN: 0.0000 [IU] | Freq: Every day | SUBCUTANEOUS | Status: DC

## 2017-11-28 MED ORDER — LEVALBUTEROL HCL 0.63 MG/3ML IN NEBU: 0.6300 mg | INHALATION_SOLUTION | Freq: Four times a day (QID) | RESPIRATORY_TRACT | Status: DC | PRN

## 2017-11-28 MED ORDER — ACETAMINOPHEN 325 MG PO TABS: 650.0000 mg | ORAL_TABLET | Freq: Four times a day (QID) | ORAL | Status: DC | PRN

## 2017-11-28 MED ORDER — IPRATROPIUM-ALBUTEROL 0.5-2.5 (3) MG/3ML IN SOLN
3.0000 mL | Freq: Four times a day (QID) | RESPIRATORY_TRACT | Status: DC
  Administered 2017-11-29: 3 mL via RESPIRATORY_TRACT
  Filled 2017-11-28: qty 3

## 2017-11-28 MED ORDER — ENOXAPARIN SODIUM 40 MG/0.4ML ~~LOC~~ SOLN
40.0000 mg | Freq: Every day | SUBCUTANEOUS | Status: DC
  Administered 2017-11-29 – 2017-11-30 (×2): 40 mg via SUBCUTANEOUS
  Filled 2017-11-28 (×2): qty 0.4

## 2017-11-28 MED ORDER — HYDROCODONE-ACETAMINOPHEN 5-325 MG PO TABS: 1.0000 | ORAL_TABLET | ORAL | Status: DC | PRN

## 2017-11-28 MED ORDER — TAMSULOSIN HCL 0.4 MG PO CAPS
0.4000 mg | ORAL_CAPSULE | Freq: Every day | ORAL | Status: DC
  Administered 2017-11-29 – 2017-11-30 (×2): 0.4 mg via ORAL
  Filled 2017-11-28 (×2): qty 1

## 2017-11-28 MED ORDER — PREDNISONE 20 MG PO TABS
40.0000 mg | ORAL_TABLET | Freq: Every day | ORAL | Status: DC
  Administered 2017-11-30: 40 mg via ORAL
  Filled 2017-11-28: qty 2

## 2017-11-28 MED ORDER — DOXYCYCLINE HYCLATE 100 MG PO TABS
100.0000 mg | ORAL_TABLET | Freq: Two times a day (BID) | ORAL | Status: DC
  Administered 2017-11-28 – 2017-11-30 (×4): 100 mg via ORAL
  Filled 2017-11-28 (×4): qty 1

## 2017-11-28 MED ORDER — POLYETHYLENE GLYCOL 3350 17 G PO PACK: 17.0000 g | PACK | Freq: Every day | ORAL | Status: DC | PRN

## 2017-11-28 NOTE — H&P (Signed)
Taylor [name] UXN:[id number] DOB: 01-12-1956 DOA: 11/28/2017     PCP: Clent Demark, PA-C   Outpatient Specialists:    Pulmonary   Dr. Melvyn Novas   Urology  Unsure about name  Patient arrived to ER on 11/28/17 at 1809  Patient coming from: home Lives alone,    Chief Complaint:  Chief Complaint  Patient presents with  . Shortness of Breath    HPI: Taylor [name] is a 62 y.o. male with medical history significant of  COPD GOLD IV on 3 L of baseline steroid dependent, Colon ca, BiPolar, Thrombocytopenia, Dm 2, Prostate Ca.  Presented with shortness of breath for past 1 day, noted to have severe SOB, no fever or chills, increased lethargy, no sick contacts, increased phlegm production clear, some wheezing, no chest pain but having heavy sensation in his chest.  he has taken Mucinex. He has tried to use Nebulizer at home with some improvement, He is on 3 L o2 at home    EMS was called on arrival gave 10 mg of albuterol 0.5 mg of Atrovent 125 Solu-Medrol and started on CPAP transitions to BiPAP while in the emergency department and was able to transition off onto 4 L of nasal cannula Recurrent admissions for COPD exacerbations discharged on 25th of  October Regarding pertinent Chronic problems: new diagnosis of Prostate Ca will see urologist this upcoming week   While in ER: Initially on BiPap now on 4L after getting a dose of Ativan   The following Work up has been ordered so far:  Orders Placed This Encounter  Procedures  . DG Chest Port 1 View  . Comprehensive metabolic panel  . Ethanol  . CBC with Differential  . Blood gas, arterial  . Urinalysis, Routine w reflex microscopic  . Consult to hospitalist  . Bipap  . I-stat Chem 8, ED  . I-stat troponin, ED  . I-Stat CG4 Lactic Acid, ED  . I-Stat arterial blood gas, ED  . EKG 12-Lead  . Saline lock IV    Following Medications were ordered in ER: Medications  LORazepam (ATIVAN) injection 1 mg (1 mg  Intravenous Given 11/28/17 1819)  ondansetron (ZOFRAN) injection 4 mg (4 mg Intravenous Given 11/28/17 1852)    Significant initial  Findings: Abnormal Labs Reviewed  COMPREHENSIVE METABOLIC PANEL - Abnormal; Notable for the following components:      Result Value   Chloride 95 (*)    Glucose, Bld 175 (*)    All other components within normal limits  CBC WITH DIFFERENTIAL/PLATELET - Abnormal; Notable for the following components:   WBC 14.3 (*)    Neutro Abs 8.7 (*)    Lymphs Abs 4.1 (*)    Abs Immature Granulocytes 0.24 (*)    All other components within normal limits  I-STAT CHEM 8, ED - Abnormal; Notable for the following components:   BUN 24 (*)    Glucose, Bld 177 (*)    Calcium, Ion 1.08 (*)    All other components within normal limits  I-STAT ARTERIAL BLOOD GAS, ED - Abnormal; Notable for the following components:   pH, Arterial 7.261 (*)    pCO2 arterial 80.7 (*)    pO2, Arterial 52.0 (*)    Bicarbonate 36.3 (*)    TCO2 39 (*)    Acid-Base Excess 6.0 (*)    All other components within normal limits   LA 1.54  Na 135 K 4.8  Cr stable,   Lab Results  Component Value  Date   CREATININE 0.90 11/28/2017   CREATININE 0.97 11/28/2017   CREATININE 0.86 11/12/2017   ABG 7.261/80.7/52  WBC  14.3  HG/HCT stable,     Component Value Date/Time   HGB 15.0 11/28/2017 1826   HGB 15.5 11/13/2011 1328   HCT 44.0 11/28/2017 1826   HCT 45.4 11/13/2011 1328   Troponin (Point of Care Test) Recent Labs    11/28/17 1825  TROPIPOC 0.00     BNP (last 3 results) Recent Labs    06/13/17 1003 08/24/17 1838 09/10/17 2215  BNP 33.8 34.7 94.6    ProBNP (last 3 results) No results for input(s): PROBNP in the last 8760 hours.  Lactic Acid, Venous    Component Value Date/Time   LATICACIDVEN 1.54 11/28/2017 1827      UA  ordered   CXR -   NON acute  ECG:  Personally reviewed by me showing: HR : 132 Rhythm: Sinus tachycardia     no evidence of ischemic  changes QTC 429     ED Triage Vitals  Enc Vitals Group     BP 11/28/17 1814 (!) 183/102     Pulse Rate 11/28/17 1814 (!) 132     Resp 11/28/17 1814 (!) 26     Temp 11/28/17 1818 (!) 96.8 F (36 C)     Temp Source 11/28/17 1818 Axillary     SpO2 11/28/17 1810 90 %     Weight --      Height --      Head Circumference --      Peak Flow --      Pain Score 11/28/17 1815 0     Pain Loc --      Pain Edu? --      Excl. in [place]? --   TMAX(24)@       Latest  Blood pressure 116/79, pulse 100, temperature (!) 96.8 F (36 C), temperature source Axillary, resp. rate (!) 25, SpO2 96 %.     Hospitalist was called for admission for COPD exacerbation with hypercarbic respiratory failure   Review of Systems:    Pertinent positives include: fatigue,  shortness of breath at rest  dyspnea on exertion excess mucus,wheezing Constitutional:  No weight loss, night sweats, Fevers, chills, weight loss  HEENT:  No headaches, Difficulty swallowing,Tooth/dental problems,Sore throat,  No sneezing, itching, ear ache, nasal congestion, post nasal drip,  Cardio-vascular:  No chest pain, Orthopnea, PND, anasarca, dizziness, palpitations.no Bilateral lower extremity swelling  GI:  No heartburn, indigestion, abdominal pain, nausea, vomiting, diarrhea, change in bowel habits, loss of appetite, melena, blood in stool, hematemesis Resp:    No no productive cough, No non-productive cough, No coughing up of blood.No change in color of mucus.No . Skin:  no rash or lesions. No jaundice GU:  no dysuria, change in color of urine, no urgency or frequency. No straining to urinate.  No flank pain.  Musculoskeletal:  No joint pain or no joint swelling. No decreased range of motion. No back pain.  Psych:  No change in mood or affect. No depression or anxiety. No memory loss.  Neuro: no localizing neurological complaints, no tingling, no weakness, no double vision, no gait abnormality, no slurred speech, no  confusion  All systems reviewed and apart from [place] all are negative  Past Medical History:   Past Medical History:  Diagnosis Date  . Asthma   . Bipolar 1 disorder ([place]) 02/05/2012  . Colon cancer ([place]) 04/11/11   adenocarcinoma of colon, 7/19  nodes pos.FINISHED CHEMO/DR. SHERRILL  . COPD (chronic obstructive pulmonary disease) ([place])    SMOKER  . Depression   . Emphysema of lung ([place])   . Full dentures   . Hemorrhoids   . On home oxygen therapy    "3L; 24/7" (05/29/2017)  . Oxygen deficiency   . Pneumonia ~ 2016   "double pneumonia"  . Prostate cancer ([place])    Gleason score = 7, supposed to have radiation therapy but he has not followed up (05/29/2017)  . Rib fractures    hx of      Past Surgical History:  Procedure Laterality Date  . COLON SURGERY  04/11/11   Sigmoid colectomy  . COLOSTOMY REVISION  04/11/2011   Procedure: COLON RESECTION SIGMOID;  Surgeon: [name], MD;  Location: [facility];  Service: General;  Laterality: N/A;  low anterior colon resection   . HERNIA REPAIR    . INGUINAL HERNIA REPAIR Right 05/01/2012   Procedure: HERNIA REPAIR INGUINAL ADULT;  Surgeon: [name], MD;  Location: [facility];  Service: General;  Laterality: Right;  . INSERTION OF MESH Right 05/01/2012   Procedure: INSERTION OF MESH;  Surgeon: [name], MD;  Location: [facility];  Service: General;  Laterality: Right;  . PORT-A-CATH REMOVAL Left 05/01/2012   Procedure: REMOVAL Infusion Port;  Surgeon: [name], MD;  Location: [facility];  Service: General;  Laterality: Left;  . PORTACATH PLACEMENT  05/02/2011   Procedure: INSERTION PORT-A-CATH;  Surgeon: [name], MD;  Location: [facility];  Service: General;  Laterality: N/A;  . PROSTATE BIOPSY      Social History:  Ambulatory   Independently     reports that he quit smoking about 6 months ago. His smoking use included cigarettes. He has a 4.60 pack-year smoking history. He has quit using smokeless tobacco.  His smokeless tobacco use  included chew. He reports that he drank alcohol. He reports that he does not use drugs.     Family History:   Family History  Problem Relation Age of Onset  . Heart disease Father   . Lung cancer Maternal Uncle        smoked    Allergies: Allergies  Allergen Reactions  . Codeine Nausea And Vomiting     Prior to Admission medications   Medication Sig Start Date End Date Taking? Authorizing Provider  albuterol (PROVENTIL HFA;VENTOLIN HFA) 108 (90 Base) MCG/ACT inhaler Inhale 2 puffs into the lungs every 6 (six) hours as needed for wheezing or shortness of breath. 10/14/17   Varney Biles, MD  albuterol (PROVENTIL) (2.5 MG/3ML) 0.083% nebulizer solution Take 3 mLs (2.5 mg total) by nebulization every 4 (four) hours as needed for wheezing or shortness of breath. 11/18/17   [name], MD  ARIPiprazole (ABILIFY) 5 MG tablet Take 1 tablet (5 mg total) by mouth daily with breakfast. 11/25/17   Virgel Manifold, MD  Blood Glucose Monitoring Suppl (ACCU-CHEK AVIVA) device Use as instructed 10/27/17 10/27/18  Clent Demark, PA-C  escitalopram (LEXAPRO) 20 MG tablet Take 1 tablet (20 mg total) by mouth daily. 11/25/17   Virgel Manifold, MD  Fluticasone-Umeclidin-Vilant (TRELEGY ELLIPTA) 100-62.5-25 MCG/INH AEPB Inhale 1 puff into the lungs every morning. 11/18/17   [name], MD  folic acid (FOLVITE) 1 MG tablet Take 1 tablet (1 mg total) by mouth daily. 10/14/17   Varney Biles, MD  glucose blood (ACCU-CHEK AVIVA) test strip 1 each by Other route as needed for other.  Use as instructed 10/27/17   Clent Demark, PA-C  Lancets [facility]) lancets 1 each by Other route 2 (two) times daily. Use as instructed 10/27/17   Clent Demark, PA-C  lovastatin (MEVACOR) 20 MG tablet Take 1 tablet (20 mg total) by mouth at bedtime. 10/27/17   Clent Demark, PA-C  metFORMIN (GLUCOPHAGE) 1000 MG tablet Take 1 tablet (1,000 mg total) by mouth 2 (two) times daily with a meal.  10/27/17   Clent Demark, PA-C  OXYGEN Inhale 3 L into the lungs continuous.     [provider]  predniSONE (DELTASONE) 20 MG tablet Take 0.5 tablets (10 mg total) by mouth daily with breakfast. Prednisone 40 mg daily for 3 days followed by  Prednisone 20 mg daily for 3 days followed by  Prednisone 10 mg daily for Patient not taking: Reported on 11/25/2017 11/14/17   Hosie Poisson, MD  tamsulosin (FLOMAX) 0.4 MG CAPS capsule Take 1 capsule (0.4 mg total) by mouth daily. 10/27/17   Clent Demark, PA-C  thiamine 100 MG tablet Take 1 tablet (100 mg total) by mouth daily. 10/14/17   Varney Biles, MD   Physical Exam: Blood pressure 116/79, pulse 100, temperature (!) 96.8 F (36 C), temperature source Axillary, resp. rate (!) 25, SpO2 96 %. 1. General:  in No Acute distress  Chronically ill  -appearing 2. Psychological: Alert and   Oriented 3. Head/ENT:     Dry Mucous Membranes                          Head Non traumatic, neck supple                            Poor Dentition 4. SKIN:   decreased Skin turgor,  Skin clean Dry and intact no rash 5. Heart: Regular rate and rhythm no Murmur, no Rub or gallop 6. Lungs:   no wheezes some  crackles   7. Abdomen: Soft,  non-tender, distended  bowel sounds present 8. Lower extremities: no clubbing, cyanosis, or edema 9. Neurologically Grossly intact, moving all 4 extremities equally   10. MSK: Normal range of motion   LABS:     Recent Labs  Lab 11/28/17 1812 11/28/17 1826  WBC 14.3*  --   NEUTROABS 8.7*  --   HGB 14.3 15.0  HCT 46.6 44.0  MCV 95.9  --   PLT 274  --    Basic Metabolic Panel: Recent Labs  Lab 11/28/17 1812 11/28/17 1826  NA 136 135  K 4.7 4.8  CL 95* 98  CO2 26  --   GLUCOSE 175* 177*  BUN 20 24*  CREATININE 0.97 0.90  CALCIUM 9.1  --       Recent Labs  Lab 11/28/17 1812  AST 23  ALT 21  ALKPHOS 55  BILITOT 0.7  PROT 6.9  ALBUMIN 4.4   No results for input(s): LIPASE, AMYLASE in  the last 168 hours. No results for input(s): AMMONIA in the last 168 hours.    HbA1C: No results for input(s): HGBA1C in the last 72 hours. CBG: No results for input(s): GLUCAP in the last 168 hours.    Urine analysis:    Component Value Date/Time   COLORURINE YELLOW 06/13/2017 0203   APPEARANCEUR CLEAR 06/13/2017 0203   LABSPEC 1.018 06/13/2017 0203   PHURINE 6.0 06/13/2017 0203   GLUCOSEU 50 (A) 06/13/2017  [place] 06/13/2017 0203   BILIRUBINUR NEGATIVE 06/13/2017 0203   KETONESUR NEGATIVE 06/13/2017 0203   PROTEINUR NEGATIVE 06/13/2017 0203   UROBILINOGEN 1.0 01/09/2013 1843   NITRITE NEGATIVE 06/13/2017 0203   LEUKOCYTESUR NEGATIVE 06/13/2017 0203      Cultures:    Component Value Date/Time   SDES BLOOD SITE NOT SPECIFIED 06/08/2017 0631   SPECREQUEST  06/08/2017 0631    BOTTLES DRAWN AEROBIC ONLY Blood Culture adequate volume   CULT  06/08/2017 0631    NO GROWTH 5 DAYS Performed at [facility], [place] [address] [place], [place] [address]    REPTSTATUS 06/13/2017 FINAL 06/08/2017 0631     Radiological Exams on Admission: Dg Chest Port 1 View  Result Date: 11/28/2017 CLINICAL DATA:  62 y/o  M; 1 day of shortness of breath. EXAM: PORTABLE CHEST 1 VIEW COMPARISON:  11/12/2017 chest radiograph. FINDINGS: Stable normal cardiac silhouette given projection and technique. No consolidation, effusion, or pneumothorax. Hyperinflated lungs with flattened diaphragms. Upper lobe predominant emphysema. Multiple chronic right-sided rib fractures. No acute osseous abnormality is evident. IMPRESSION: No acute pulmonary process identified. Stable findings of emphysema. Electronically Signed   By: Kristine Garbe M.D.   On: 11/28/2017 18:38    Chart has been reviewed    Assessment/Plan  62 y.o. 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 COPD with acute exacerbation ([place]) - Will initiate: Steroid taper  -  Antibiotics   Doxycycline, -   XopenexPRN, - scheduled duoneb,  -  Breo or Dulera at discharge   -  Mucinex.  Titrate O2 to saturation >90%. Follow patients respiratory status.  Order respiratory panel   -   PCCM consulted for consult in AM  -BiPAP ordered PRN for evidence of symptomatic hypercarbia  . 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 Tobacco abuse - in remission  . HLD (hyperlipidemia) - stable cont home meds . Acute respiratory failure with hypercapnia (HCC) - on BiPAP, follow repeat ABG, discussed with PCCM  . Essential hypertension - not on BP Meds at home will monitor . Abdominal distention - obtain KUB, bladder scan  . Bipolar 1 disorder ([facility]) - stable, cont home meds  Other plan as per orders.  DVT prophylaxis:    Lovenox     Code Status:  FULL CODE   as per patient   I had personally discussed CODE STATUS with patient    Family Communication:   Family not  at  Bedside    Disposition Plan:      To home once workup is complete and patient is stable                                          Consults called: PCCM aware    Admission status:    inpatient     Expect 2 midnight stay secondary to severity of patient's current illness including hemodynamic instability despite optimal treatment  (hypoxia worse from baseline)  Severe lab abnormalities including PCO2 80 hypercarbia and extensive comorbidities including:  DM2    That  are currently affecting medical management.  I expect  patient to be hospitalized for 2 midnights requiring inpatient medical care.  Patient is at high risk for adverse outcome (such as loss of life or disability) if not treated.  Indication for inpatient stay as follows:  Hemodynamic instability despite maximal medical  therapy,    Need for BiPAP critical Care intervention Need for IV antibiotics, , IV medications      Level of care       SDU tele indefinitely please discontinue once patient no longer qualifies      Liliahna Cudd 11/28/2017, 9:34 PM    [facility]  Pager [phone]   after 2 AM please page floor coverage PA If 7AM-7PM, please contact the day team taking care of the patient  [email or web address]  Password [email or web address]

## 2017-11-28 NOTE — Progress Notes (Addendum)
Critical ABG result called to Jacqulyn Ducking, RN at 2230 by Glena Norfolk, RRT RCP on 11/28/17.   pH 7.32  pCO2 65.4  pO2 119  HcO3 33.0   ABG drawn while pt was on BIPAP 40% 18/6 RR 12  Unable to enter results into [employer] at this time due to computer freezing. Will correct when able.
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
## 2017-11-28 NOTE — ED Triage Notes (Signed)
Pt here for evaluation of shortness of breath x1 day and placed on CPAP by EMS. On home O2 at 3L. Given 10mg  albuterol, 0.5 atrovent, 125 solumedrol and CPAP by EMS.

## 2017-11-28 NOTE — Progress Notes (Signed)
RT NOTE:  Pt back on BIPAP @ MD request. MD wants ABG in 45-1Hr for CO2 confirmation before taking patient of BIPAP.

## 2017-11-28 NOTE — Progress Notes (Signed)
RT NOTE:  Pt transported to 2W20 without event. Report given to [facility], RRT.

## 2017-11-28 NOTE — ED Notes (Signed)
Dr. Roel Cluck at bedside, pt to be placed back on bipap and have ABG in 45-1hr to confirm co2 is decreasing.

## 2017-11-28 NOTE — Progress Notes (Signed)
RT NOTE:  Pt taken off BIPAP. 4L [place] tolerated well @ this time.

## 2017-11-28 NOTE — ED Notes (Signed)
RT at bedside for transport

## 2017-11-28 NOTE — ED Notes (Signed)
RT at bedside, pt put on 4L [place] for trial off bipap

## 2017-11-29 ENCOUNTER — Encounter (HOSPITAL_COMMUNITY): Payer: Self-pay

## 2017-11-29 DIAGNOSIS — J441 Chronic obstructive pulmonary disease with (acute) exacerbation: Principal | ICD-10-CM

## 2017-11-29 LAB — RESPIRATORY PANEL BY PCR

## 2017-11-29 LAB — BLOOD GAS, ARTERIAL
Acid-Base Excess: 7.2 mmol/L — ABNORMAL HIGH (ref 0.0–2.0)
Bicarbonate: 33 mmol/L — ABNORMAL HIGH (ref 20.0–28.0)
Delivery systems: POSITIVE
Drawn by: 330991
Expiratory PAP: 6
FIO2: 40
Inspiratory PAP: 18
O2 Saturation: 98.5 %
Patient temperature: 98.6
RATE: 12 {breaths}/min
pCO2 arterial: 65.4 mmHg (ref 32.0–48.0)
pH, Arterial: 7.324 — ABNORMAL LOW (ref 7.350–7.450)
pO2, Arterial: 119 mmHg — ABNORMAL HIGH (ref 83.0–108.0)

## 2017-11-29 LAB — CBC
HEMATOCRIT: 39.2 % (ref 39.0–52.0)
Hemoglobin: 12.7 g/dL — ABNORMAL LOW (ref 13.0–17.0)
MCH: 29.1 pg (ref 26.0–34.0)
MCHC: 32.4 g/dL (ref 30.0–36.0)
MCV: 89.9 fL (ref 80.0–100.0)
NRBC: 0 % (ref 0.0–0.2)
Platelets: 198 10*3/uL (ref 150–400)
RBC: 4.36 MIL/uL (ref 4.22–5.81)
RDW: 13.3 % (ref 11.5–15.5)
WBC: 4.6 10*3/uL (ref 4.0–10.5)

## 2017-11-29 LAB — MAGNESIUM: Magnesium: 1.9 mg/dL (ref 1.7–2.4)

## 2017-11-29 LAB — COMPREHENSIVE METABOLIC PANEL
ALBUMIN: 3.7 g/dL (ref 3.5–5.0)
ALT: 20 U/L (ref 0–44)
AST: 20 U/L (ref 15–41)
Alkaline Phosphatase: 46 U/L (ref 38–126)
Anion gap: 7 (ref 5–15)
BUN: 17 mg/dL (ref 8–23)
CHLORIDE: 98 mmol/L (ref 98–111)
CO2: 31 mmol/L (ref 22–32)
CREATININE: 0.77 mg/dL (ref 0.61–1.24)
Calcium: 8.8 mg/dL — ABNORMAL LOW (ref 8.9–10.3)
GFR calc non Af Amer: >60 mL/min (ref >60)
GLUCOSE: 200 mg/dL — AB (ref 70–99)
Potassium: 4.6 mmol/L (ref 3.5–5.1)
SODIUM: 136 mmol/L (ref 135–145)
Total Bilirubin: 0.6 mg/dL (ref 0.3–1.2)
Total Protein: 6.2 g/dL — ABNORMAL LOW (ref 6.5–8.1)

## 2017-11-29 LAB — PHOSPHORUS: Phosphorus: 2.8 mg/dL (ref 2.5–4.6)

## 2017-11-29 LAB — TROPONIN I

## 2017-11-29 LAB — GLUCOSE, CAPILLARY
Glucose-Capillary: 199 mg/dL — ABNORMAL HIGH (ref 70–99)
Glucose-Capillary: 161 mg/dL — ABNORMAL HIGH (ref 70–99)

## 2017-11-29 LAB — HEMOGLOBIN A1C
Hgb A1c MFr Bld: 7 % — ABNORMAL HIGH (ref 4.8–5.6)
Mean Plasma Glucose: 154.2 mg/dL

## 2017-11-29 LAB — TSH: TSH: 0.32 u[IU]/mL — AB (ref 0.350–4.500)

## 2017-11-29 LAB — HIV ANTIBODY (ROUTINE TESTING W REFLEX): HIV Screen 4th Generation wRfx: NONREACTIVE

## 2017-11-29 MED ORDER — FLUTICASONE PROPIONATE 50 MCG/ACT NA SUSP
1.0000 | Freq: Every day | NASAL | Status: DC
  Administered 2017-11-29 – 2017-11-30 (×2): 1 via NASAL
  Filled 2017-11-29: qty 16

## 2017-11-29 MED ORDER — POLYETHYLENE GLYCOL 3350 17 G PO PACK
17.0000 g | PACK | Freq: Every day | ORAL | Status: DC
  Administered 2017-11-29 – 2017-11-30 (×2): 17 g via ORAL
  Filled 2017-11-29 (×2): qty 1

## 2017-11-29 MED ORDER — SENNOSIDES-DOCUSATE SODIUM 8.6-50 MG PO TABS
2.0000 | ORAL_TABLET | Freq: Two times a day (BID) | ORAL | Status: DC
  Administered 2017-11-29 – 2017-11-30 (×2): 2 via ORAL
  Filled 2017-11-29 (×2): qty 2

## 2017-11-29 MED ORDER — IPRATROPIUM-ALBUTEROL 0.5-2.5 (3) MG/3ML IN SOLN
3.0000 mL | Freq: Three times a day (TID) | RESPIRATORY_TRACT | Status: DC
  Administered 2017-11-29 – 2017-11-30 (×4): 3 mL via RESPIRATORY_TRACT
  Filled 2017-11-29 (×4): qty 3

## 2017-11-29 MED ORDER — SALINE SPRAY 0.65 % NA SOLN
1.0000 | NASAL | Status: DC | PRN
  Filled 2017-11-29: qty 44

## 2017-11-29 NOTE — Plan of Care (Signed)
Discussed plan of care for the evening with patient.  Emphasized using the call bell when assistance is needed.  Encouraged patient to void in urinal.  Good teach back displayed.

## 2017-11-29 NOTE — Progress Notes (Signed)
[facility] Progress Note  Patient: Taylor [name] YNW:[id number]   PCP: Clent Demark, PA-C DOB: 28-Nov-1955   DOA: 11/28/2017   DOS: 11/29/2017   Date of Service: the patient was seen and examined on 11/29/2017  Brief hospital course: Pt. with PMH of COPD, chronic hypoxia, alcohol abuse; admitted on 11/28/2017, presented with complaint of shortness of breath, was found to have COPD exacerbation secondary to bronchitis. Currently further plan is continue current treatment.  Subjective: Feeling better but still severely short of breath.  On home oxygen again with severe tachypnea.  Assessment and Plan: 1.  COPD exacerbation. Chronic hypercarbic hypoxic respiratory failure. Appreciate pulmonary assistance. Continue nebulizer, continue steroids and doxycycline. Patient will follow-up with pulmonary outpatient. Will use BiPAP PRN.  diabetes mellitus ([place]) -  - Order moderate  SSI   -  check TSH and HgA1C  - Hold by mouth medications   . Tobacco abuse - in remission  . HLD (hyperlipidemia) - stable cont home meds . Acute respiratory failure with hypercapnia ([facility]) - on BiPAP, follow repeat ABG, discussed with PCCM  . Essential hypertension - not on BP Meds at home will monitor . Abdominal distention - obtain KUB, bladder scan  . Bipolar 1 disorder ([facility]) - stable, cont home meds  Diet: carb modified diet DVT Prophylaxis: subcutaneous Heparin  Advance goals of care discussion: full code  Family Communication: no family was present at bedside, at the time of interview.  Disposition:  Discharge to home Monday.  Consultants: PCCM  Procedures: none  Scheduled Meds: . ARIPiprazole  5 mg Oral Q breakfast  . doxycycline  100 mg Oral Q12H  . enoxaparin (LOVENOX) injection  40 mg Subcutaneous Daily  . escitalopram  20 mg Oral Daily  . fluticasone  1 spray Each Nare Daily  . folic acid  1 mg Oral Daily  . guaiFENesin  600 mg Oral BID  . insulin aspart  0-15 Units  Subcutaneous TID WC  . insulin aspart  0-5 Units Subcutaneous QHS  . ipratropium-albuterol  3 mL Nebulization TID  . polyethylene glycol  17 g Oral Daily  . pravastatin  10 mg Oral q1800  . [START ON 11/30/2017] predniSONE  40 mg Oral Q breakfast  . senna-docusate  2 tablet Oral BID  . tamsulosin  0.4 mg Oral Daily  . thiamine  100 mg Oral Daily   Continuous Infusions: PRN Meds: acetaminophen **OR** acetaminophen, ALPRAZolam, HYDROcodone-acetaminophen, levalbuterol, ondansetron **OR** ondansetron (ZOFRAN) IV, polyethylene glycol, sodium chloride Antibiotics: Anti-infectives (From admission, onward)   Start     Dose/Rate Route Frequency Ordered Stop   11/28/17 2330  doxycycline (VIBRA-TABS) tablet 100 mg     100 mg Oral Every 12 hours 11/28/17 2319 12/03/17 2159       Objective: Physical Exam: Vitals:   11/29/17 0903 11/29/17 1227 11/29/17 1443 11/29/17 1651  BP:  135/72  122/67  Pulse:  95  90  Resp:  19  18  Temp:  98.3 F (36.8 C)  98.3 F (36.8 C)  TempSrc:  Oral  Oral  SpO2: 95% 93% 94% 94%  Weight:        Intake/Output Summary (Last 24 hours) at 11/29/2017 1719 Last data filed at 11/29/2017 1540 Gross per 24 hour  Intake 804.02 ml  Output 1300 ml  Net -495.98 ml   Filed Weights   11/28/17 2100  Weight: 76 kg   General: Alert, Awake and Oriented to Time, Place and Person. Appear in mild distress, affect appropriate  Eyes: PERRL, Conjunctiva normal ENT: Oral Mucosa clear moist. Neck: no JVD, no Abnormal Mass Or lumps Cardiovascular: S1 and S2 Present, no Murmur, Peripheral Pulses Present Respiratory: normal respiratory effort, Bilateral Air entry equal and Decreased, no use of accessory muscle, no Crackles, bilateral  wheezes Abdomen: Bowel Sound present, Soft and no tenderness, no hernia Skin: no redness, no Rash, no induration Extremities: no Pedal edema, no calf tenderness Neurologic: Grossly no focal neuro deficit. Bilaterally Equal motor strength  Data  Reviewed: CBC: Recent Labs  Lab 11/28/17 1812 11/28/17 1826 11/29/17 0508  WBC 14.3*  --  4.6  NEUTROABS 8.7*  --   --   HGB 14.3 15.0 12.7*  HCT 46.6 44.0 39.2  MCV 95.9  --  89.9  PLT 274  --  254   Basic Metabolic Panel: Recent Labs  Lab 11/28/17 1812 11/28/17 1826 11/29/17 0508  NA 136 135 136  K 4.7 4.8 4.6  CL 95* 98 98  CO2 26  --  31  GLUCOSE 175* 177* 200*  BUN 20 24* 17  CREATININE 0.97 0.90 0.77  CALCIUM 9.1  --  8.8*  MG  --   --  1.9  PHOS  --   --  2.8    Liver Function Tests: Recent Labs  Lab 11/28/17 1812 11/29/17 0508  AST 23 20  ALT 21 20  ALKPHOS 55 46  BILITOT 0.7 0.6  PROT 6.9 6.2*  ALBUMIN 4.4 3.7   No results for input(s): LIPASE, AMYLASE in the last 168 hours. No results for input(s): AMMONIA in the last 168 hours. Coagulation Profile: No results for input(s): INR, PROTIME in the last 168 hours. Cardiac Enzymes: Recent Labs  Lab 11/28/17 2306 11/29/17 0508 11/29/17 1110  TROPONINI <0.03 <0.03 <0.03   BNP (last 3 results) No results for input(s): PROBNP in the last 8760 hours. CBG: Recent Labs  Lab 11/28/17 2351  GLUCAP 164*   Studies: Dg Abd 1 View  Result Date: 11/28/2017 CLINICAL DATA:  62 y/o  M; abdominal distention. EXAM: ABDOMEN - 1 VIEW COMPARISON:  08/31/2017 CT abdomen and pelvis. FINDINGS: Mild distention of the stomach. No distention of small or large bowel. Moderate volume of stool throughout the colon. Moderate lumbar spondylosis. No radiopaque urinary stone disease identified. IMPRESSION: Nonobstructive bowel gas pattern. Moderate volume of stool throughout the colon. Mild distention of stomach. Electronically Signed   By: Kristine Garbe M.D.   On: 11/28/2017 21:24   Dg Chest Port 1 View  Result Date: 11/28/2017 CLINICAL DATA:  62 y/o  M; 1 day of shortness of breath. EXAM: PORTABLE CHEST 1 VIEW COMPARISON:  11/12/2017 chest radiograph. FINDINGS: Stable normal cardiac silhouette given projection and  technique. No consolidation, effusion, or pneumothorax. Hyperinflated lungs with flattened diaphragms. Upper lobe predominant emphysema. Multiple chronic right-sided rib fractures. No acute osseous abnormality is evident. IMPRESSION: No acute pulmonary process identified. Stable findings of emphysema. Electronically Signed   By: Kristine Garbe M.D.   On: 11/28/2017 18:38     Time spent: 35 minutes  Author: Berle Mull, MD [facility] Pager: [phone] 11/29/2017 5:19 PM  Between 7PM-7AM, please contact night-coverage at [email or web address], password [facility]

## 2017-11-29 NOTE — Consult Note (Addendum)
PULMONARY / CRITICAL CARE MEDICINE   NAME:  Taylor [name], MRN:  [id number], DOB:  27-Jun-1955, LOS: 1 ADMISSION DATE:  11/28/2017, CONSULTATION DATE: 11/29/2017 REFERRING MD: [facility], CHIEF COMPLAINT: Shortness of breath  BRIEF HISTORY:    COPD, vascular abuse, prostate cancer nontreated, history of tobacco and alcohol abuse. HISTORY OF PRESENT ILLNESS   Taylor [name] is a 62 year old 1 pack a day smoker 6 pack a day drinker who was recently incarcerated as a sex offender.  He is O2 dependent at 2 to 3 L nasal cannula daily.  He reports having increasing nasal congestion for approximately 3 days hospital approximately 24 hours prior to this event and improved refractory to treatment.  He does have extensive past medical history that include emphysema with FEV1 of 32%, prostate cancer diagnosed 3 years ago that is not been treated, colon cancer, he denies fevers chills and sweats.  He is briefly treated with noninvasive mechanical ventilatory support.  Does have a component of hypercarbia and is on anxiolytics that may be contributing to his hypercarbia.  At the time of examination he is in no acute distress able to carry on sentences without being short of breath. SIGNIFICANT PAST MEDICAL HISTORY    COPD with FEV1 of 32% Diagnosed with prostate cancer Gleason score 7. 3 years ago did not undergo radiation therapy as instructed Anxiety and bipolar History of colon cancer  SIGNIFICANT EVENTS:  Taylor [name] 2019 admitted to the STUDIES:   11/28/2017 chest x-ray shows changes of emphysema with no acute findings CULTURES:  11/28/2017 respiratory virus panel is negative 11/28/2017 blood cultures x2  ANTIBIOTICS:  11/28/2017 doxycycline>>  LINES/TUBES:    CONSULTANTS:  11/29/2017 pulmonary SUBJECTIVE:  62 year old who does not appear to be in any acute distress.  CONSTITUTIONAL: BP 99/81 (BP Location: Right Arm)   Pulse 84   Temp 98.3 F (36.8 C) (Oral)   Resp (!) 25   Wt 76 kg   SpO2 95%    BMI 23.37 kg/m   I/O last 3 completed shifts: In: 431 [P.O.:120; I.V.:444] Out: 700 [Urine:700]     Vent Mode: BIPAP;PCV FiO2 (%):  [40 %] 40 % Set Rate:  [12 bmp] 12 bmp PEEP:  [6 cmH20] 6 cmH20  PHYSICAL EXAM: General: Anxious male in no acute distress on oxygen. Neuro: Follows commands, moves all extremities no acute distress HEENT: No JVD lymphadenopathy is appreciated Cardiovascular: Sounds are regular rate rate rhythm Lungs: Mild expiratory wheeze good air movement Abdomen: Nontender Musculoskeletal: Intact Skin: No edema  RESOLVED PROBLEM LIST   ASSESSMENT AND PLAN   Acute on chronic hypoxia and hypercarbia with acute exacerbation COPD fairly regular rate  Noninvasive mechanical ventilatory support as needed Bronchodilators Steroid Antibiotic Oxygen as needed maintain sats greater than 92% He has a history of anxiety his anxiety medications may be contributing to his hypercarbia therefore careful sedating medication Consider limited CT of sinuses to rule out sinus trigger for acute exacerbations of COPD  Diagnosed with prostate cancer Gleason 7, diagnosed 3 years ago.  He was to undergo radiation therapy but has not done so as yet.  CT of the abdomen does not show any mets nor does nuclear medicine reveal any types of metastatic metastases. History of colon cancer Continue with oncology work-up  Anxiety bipolar Continue current medication Careful with sedation  DM CBG (last 3)  Recent Labs    11/28/17 2351  GLUCAP 164*   Sliding scale insulin per primary note steroid exacerbation glucose   SUMMARY OF TODAY'S  PLAN:  62 year old with a history of anxiety, COPD Gold stage IV with a documented FEV1 of 32% of predicted.  Presents with increasing shortness of breath.  Noted to have sinus drainage.  Anxiety makes level of hypoxia difficulty ascertained. Agree with current intervention.  Best Practice / Goals of Care / Disposition.   DVT PROPHYLAXIS: Low  molecular weight heparin SUP: NUTRITION: Oral diet MOBILITY: As tolerated GOALS OF CARE: Full code FAMILY DISCUSSIONS: Patient updated at bedside DISPOSITION remains in stepdown unit  LABS  Glucose Recent Labs  Lab 11/28/17 2351  GLUCAP 164*    BMET Recent Labs  Lab 11/28/17 1812 11/28/17 1826 11/29/17 0508  NA 136 135 136  K 4.7 4.8 4.6  CL 95* 98 98  CO2 26  --  31  BUN 20 24* 17  CREATININE 0.97 0.90 0.77  GLUCOSE 175* 177* 200*    Liver Enzymes Recent Labs  Lab 11/28/17 1812 11/29/17 0508  AST 23 20  ALT 21 20  ALKPHOS 55 46  BILITOT 0.7 0.6  ALBUMIN 4.4 3.7    Electrolytes Recent Labs  Lab 11/28/17 1812 11/29/17 0508  CALCIUM 9.1 8.8*  MG  --  1.9  PHOS  --  2.8    CBC Recent Labs  Lab 11/28/17 1812 11/28/17 1826 11/29/17 0508  WBC 14.3*  --  4.6  HGB 14.3 15.0 12.7*  HCT 46.6 44.0 39.2  PLT 274  --  198    ABG Recent Labs  Lab 11/28/17 1819 11/28/17 2215  PHART 7.261* 7.324*  PCO2ART 80.7* 65.4*  PO2ART 52.0* 119*    Coag's No results for input(s): APTT, INR in the last 168 hours.  Sepsis Markers Recent Labs  Lab 11/28/17 1827 11/28/17 2031  LATICACIDVEN 1.54 1.32    Cardiac Enzymes Recent Labs  Lab 11/28/17 2306 11/29/17 0508  TROPONINI <0.03 <0.03    PAST MEDICAL HISTORY :   He  has a past medical history of Asthma, Bipolar 1 disorder ([place]) (02/05/2012), Colon cancer ([place]) (04/11/11), COPD (chronic obstructive pulmonary disease) ([place]), Depression, Emphysema of lung ([place]), Full dentures, Hemorrhoids, On home oxygen therapy, Oxygen deficiency, Pneumonia (~ 2016), Prostate cancer ([place]), and Rib fractures.  PAST SURGICAL HISTORY:  He  has a past surgical history that includes Colostomy revision (04/11/2011); Colon surgery (04/11/11); Portacath placement (05/02/2011); Inguinal hernia repair (Right, 05/01/2012); Insertion of mesh (Right, 05/01/2012); Port-a-cath removal (Left, 05/01/2012); Hernia repair; and Prostate  biopsy.  Allergies  Allergen Reactions  . Codeine Nausea And Vomiting    No current facility-administered medications on file prior to encounter.    Current Outpatient Medications on File Prior to Encounter  Medication Sig  . albuterol (PROVENTIL HFA;VENTOLIN HFA) 108 (90 Base) MCG/ACT inhaler Inhale 2 puffs into the lungs every 6 (six) hours as needed for wheezing or shortness of breath.  Marland Kitchen albuterol (PROVENTIL) (2.5 MG/3ML) 0.083% nebulizer solution Take 3 mLs (2.5 mg total) by nebulization every 4 (four) hours as needed for wheezing or shortness of breath.  . ARIPiprazole (ABILIFY) 5 MG tablet Take 1 tablet (5 mg total) by mouth daily with breakfast.  . escitalopram (LEXAPRO) 20 MG tablet Take 1 tablet (20 mg total) by mouth daily.  . Fluticasone-Umeclidin-Vilant (TRELEGY ELLIPTA) 100-62.5-25 MCG/INH AEPB Inhale 1 puff into the lungs every morning.  . folic acid (FOLVITE) 1 MG tablet Take 1 tablet (1 mg total) by mouth daily.  Marland Kitchen lovastatin (MEVACOR) 20 MG tablet Take 1 tablet (20 mg total) by mouth at bedtime.  Marland Kitchen  metFORMIN (GLUCOPHAGE) 1000 MG tablet Take 1 tablet (1,000 mg total) by mouth 2 (two) times daily with a meal.  . OXYGEN Inhale 3 L into the lungs continuous.   . tamsulosin (FLOMAX) 0.4 MG CAPS capsule Take 1 capsule (0.4 mg total) by mouth daily.  Marland Kitchen thiamine 100 MG tablet Take 1 tablet (100 mg total) by mouth daily.  . Blood Glucose Monitoring Suppl (ACCU-CHEK AVIVA) device Use as instructed  . glucose blood (ACCU-CHEK AVIVA) test strip 1 each by Other route as needed for other. Use as instructed  . Lancets (ACCU-CHEK SOFT TOUCH) lancets 1 each by Other route 2 (two) times daily. Use as instructed  . predniSONE (DELTASONE) 20 MG tablet Take 0.5 tablets (10 mg total) by mouth daily with breakfast. Prednisone 40 mg daily for 3 days followed by  Prednisone 20 mg daily for 3 days followed by  Prednisone 10 mg daily for (Patient not taking: Reported on 11/25/2017)    FAMILY  HISTORY:   His family history includes Heart disease in his father; Lung cancer in his maternal uncle.  SOCIAL HISTORY:  He  reports that he quit smoking about 6 months ago. His smoking use included cigarettes. He has a 4.60 pack-year smoking history. He has quit using smokeless tobacco.  His smokeless tobacco use included chew. He reports that he drank alcohol. He reports that he does not use drugs.  REVIEW OF SYSTEMS:    10 point review of system taken, please see HPI for positives and negatives.   Richardson Landry Mirage Pfefferkorn ACNP Maryanna Shape PCCM Pager [phone] till 1 pm If no answer page [phone] 11/29/2017, 11:41 AM

## 2017-11-29 NOTE — Evaluation (Signed)
Physical Therapy Evaluation Patient Details Name: Taylor [name] MRN: [id number] DOB: [birth year] Today's Date: 11/29/2017   History of Present Illness  Pt is a 62 yo male admitted with shortness of breath. Found to have acute on chronic hypoxia and hypercarbia with acute exacerbation. PMH including but not limited to Bipolar, COPD and recent diagnosis of prostate cancer.  Clinical Impression  Pt admitted with above diagnosis. Pt currently with functional limitations due to the deficits listed below (see PT Problem List). On PT evaluation, patient presenting with decreased functional mobility secondary to decreased endurance and balance impairments. Ambulating 150 feet with no assistive device and min guard assist. SpO2 90-94% on 3L O2. Pt will benefit from skilled PT to increase their independence and safety with mobility to allow discharge to the venue listed below.       Follow Up Recommendations Home health PT    Equipment Recommendations  None recommended by PT    Recommendations for Other Services       Precautions / Restrictions Precautions Precautions: Fall Restrictions Weight Bearing Restrictions: No      Mobility  Bed Mobility Overal bed mobility: Independent                Transfers Overall transfer level: Independent                  Ambulation/Gait Ambulation/Gait assistance: Min guard Gait Distance (Feet): 150 Feet Assistive device: None Gait Pattern/deviations: Step-through pattern;Decreased stride length;Wide base of support Gait velocity: decreased Gait velocity interpretation: <1.8 ft/sec, indicate of risk for recurrent falls General Gait Details: Pt with one lateral LOB, but able to corrct without physical assistance. Slow speed and DOE 3/4 towards end of walk  Stairs            Wheelchair Mobility    Modified Rankin (Stroke Patients Only)       Balance Overall balance assessment: Needs assistance Sitting-balance  support: Feet supported Sitting balance-Leahy Scale: Good     Standing balance support: No upper extremity supported Standing balance-Leahy Scale: Fair                               Pertinent Vitals/Pain Pain Assessment: No/denies pain    Home Living Family/patient expects to be discharged to:: Other (Comment)([employer]) Living Arrangements: Alone               Additional Comments: Has no medical equipment other than oxygen    Prior Function Level of Independence: Independent         Comments: Limited ambulator. Reports he has transportation to doctor appointments through [facility]. Friend does his grocery shopping     Hand Dominance   Dominant Hand: Right    Extremity/Trunk Assessment   Upper Extremity Assessment Upper Extremity Assessment: Overall WFL for tasks assessed    Lower Extremity Assessment Lower Extremity Assessment: Overall WFL for tasks assessed    Cervical / Trunk Assessment Cervical / Trunk Assessment: Normal  Communication   Communication: No difficulties  Cognition Arousal/Alertness: Awake/alert Behavior During Therapy: WFL for tasks assessed/performed Overall Cognitive Status: Within Functional Limits for tasks assessed                                        General Comments      Exercises     Assessment/Plan    PT  Assessment Patient needs continued PT services  PT Problem List Decreased balance;Decreased mobility;Decreased coordination;Cardiopulmonary status limiting activity       PT Treatment Interventions DME instruction;Gait training;Stair training;Functional mobility training;Therapeutic activities;Therapeutic exercise;Balance training;Neuromuscular re-education;Patient/family education    PT Goals (Current goals can be found in the Care Plan section)  Acute Rehab PT Goals Patient Stated Goal: "new lungs." PT Goal Formulation: With patient Time For Goal Achievement: 12/13/17 Potential to  Achieve Goals: Good    Frequency Min 3X/week   Barriers to discharge        Co-evaluation               AM-PAC PT "6 Clicks" Daily Activity  Outcome Measure Difficulty turning over in bed (including adjusting bedclothes, sheets and blankets)?: None Difficulty moving from lying on back to sitting on the side of the bed? : None Difficulty sitting down on and standing up from a chair with arms (e.g., wheelchair, bedside commode, etc,.)?: None Help needed moving to and from a bed to chair (including a wheelchair)?: A Little Help needed walking in hospital room?: A Little Help needed climbing 3-5 steps with a railing? : A Little 6 Click Score: 21    End of Session Equipment Utilized During Treatment: Gait belt;Oxygen Activity Tolerance: Patient tolerated treatment well;Patient limited by fatigue Patient left: in bed;with call bell/phone within reach;with bed alarm set Nurse Communication: Mobility status PT Visit Diagnosis: Other abnormalities of gait and mobility (R26.89)    Time: 6712-4580 PT Time Calculation (min) (ACUTE ONLY): 12 min   Charges:   PT Evaluation $PT Eval Moderate Complexity: 1 Mod          Ellamae Sia, PT, DPT Acute Rehabilitation Services Pager [phone] Office [phone]  Willy Eddy 11/29/2017, 2:34 PM

## 2017-11-29 NOTE — Progress Notes (Addendum)
Nutrition Brief Note  Received Consult from the COPD Protocol. Patient reports good intake now and PTA. He consumed 100% of breakfast and lunch today.   From review of weight encounters below, weight has been stable for the past year.   Wt Readings from Last 30 Encounters:  11/28/17 76 kg  11/18/17 76.9 kg  11/13/17 77 kg  10/27/17 77.5 kg  10/25/17 74 kg  10/17/17 75.4 kg  10/14/17 70.8 kg  09/28/17 70.5 kg  09/22/17 70.7 kg  09/21/17 70.8 kg  09/15/17 70.4 kg  09/07/17 71 kg  08/31/17 71.1 kg  08/29/17 72.6 kg  08/24/17 72.5 kg  08/13/17 72.6 kg  06/23/17 72.6 kg  06/17/17 71.8 kg  06/07/17 68 kg  06/05/17 68 kg  05/30/17 68.4 kg  05/21/17 76.1 kg  05/18/17 74.9 kg  04/28/17 74 kg  04/18/17 74.8 kg  04/15/17 74.6 kg  04/06/17 75.9 kg  03/27/17 75.8 kg  03/24/17 76.6 kg  12/12/16 74.8 kg     Body mass index is 23.37 kg/m. Patient meets criteria for normal weight based on current BMI.   Previously met criteria for moderate PCM, however, patient reports he has been eating well. Current nutrition focused physical exam shows no muscle or fat depletion.   Current diet order is CHO modified, patient is consuming approximately 100% of meals at this time. Labs and medications reviewed.   No nutrition interventions warranted at this time. If nutrition issues arise, please consult RD.    Molli Barrows, RD, LDN, [place] Pager [phone] After Hours Pager [phone]

## 2017-11-30 LAB — BASIC METABOLIC PANEL
ANION GAP: 7 (ref 5–15)
BUN: 20 mg/dL (ref 8–23)
CALCIUM: 8.6 mg/dL — AB (ref 8.9–10.3)
CO2: 30 mmol/L (ref 22–32)
Chloride: 99 mmol/L (ref 98–111)
Creatinine, Ser: 0.88 mg/dL (ref 0.61–1.24)
GLUCOSE: 194 mg/dL — AB (ref 70–99)
Potassium: 4.4 mmol/L (ref 3.5–5.1)
SODIUM: 136 mmol/L (ref 135–145)

## 2017-11-30 LAB — CBC
HCT: 37 % — ABNORMAL LOW (ref 39.0–52.0)
Hemoglobin: 11.8 g/dL — ABNORMAL LOW (ref 13.0–17.0)
MCH: 29 pg (ref 26.0–34.0)
MCHC: 31.9 g/dL (ref 30.0–36.0)
MCV: 90.9 fL (ref 80.0–100.0)
NRBC: 0 % (ref 0.0–0.2)
PLATELETS: 195 10*3/uL (ref 150–400)
RBC: 4.07 MIL/uL — ABNORMAL LOW (ref 4.22–5.81)
RDW: 13.2 % (ref 11.5–15.5)
WBC: 14.3 10*3/uL — AB (ref 4.0–10.5)

## 2017-11-30 LAB — GLUCOSE, CAPILLARY: GLUCOSE-CAPILLARY: 142 mg/dL — AB (ref 70–99)

## 2017-11-30 MED ORDER — GUAIFENESIN ER 600 MG PO TB12: 600.0000 mg | ORAL_TABLET | Freq: Two times a day (BID) | ORAL | 0 refills | Status: DC

## 2017-11-30 MED ORDER — PREDNISONE 10 MG PO TABS: ORAL_TABLET | ORAL | 0 refills | Status: DC

## 2017-11-30 MED ORDER — DOXYCYCLINE HYCLATE 100 MG PO TABS: 100.0000 mg | ORAL_TABLET | Freq: Two times a day (BID) | ORAL | 0 refills | Status: AC

## 2017-11-30 MED ORDER — SALINE SPRAY 0.65 % NA SOLN: 1.0000 | NASAL | 0 refills | Status: DC | PRN

## 2017-11-30 MED ORDER — FLUTICASONE PROPIONATE 50 MCG/ACT NA SUSP: 1.0000 | Freq: Every day | NASAL | 0 refills | Status: DC

## 2017-11-30 NOTE — Care Management Note (Addendum)
Case Management Note  Patient Details  Name: Taylor [name] MRN: [id number] Date of Birth: 10/05/1955  Subjective/Objective:          Pt presented for COPD exacerbation.  Pt lives alone in [place] and has home O2 with [facility].  Pt has not used home health services and has no preference.  Pt has cab voucher for d/c and has no O2 tank.  Pt states [facility] supplied tank last time he was discharged.    Action/Plan: [facility] has no on-call staff to supply a tank.    Referral called to [facility] with [facility] for [facility] PT and RN.  Pt will be receiving services at [facility] [place], [place], [place] [address].  [facility] will provide O2 tank for transport home in taxi.    Expected Discharge Date:  11/30/17               Expected Discharge Plan:  [place]  In-House Referral:  NA  Discharge planning Services  CM Consult  Post Acute Care Choice:  Home Health Choice offered to:  Patient  DME Arranged:  N/A DME Agency:     HH Arranged:  RN, PT [place] Agency:  [facility]  Status of Service:  Completed, signed off  If discussed at [place] of Stay Meetings, dates discussed:    Additional Comments:   [facility] unable to take patient as he is a sex offender.  As with last admission, will be unable to secure [facility] due to patient's criminal history.   Claudie Leach, RN 11/30/2017, 1:43 PM

## 2017-11-30 NOTE — Progress Notes (Signed)
OT Cancellation Note  Patient Details Name: Taylor [name] MRN: [id number] DOB: 13-Sep-1955   Cancelled Treatment:    Reason Eval/Treat Not Completed: OT screened, no needs identified, will sign off. (Per PT and RN, pt is at baseline and planning to dc home today.)  Anthony, OTR/L Acute Rehab Pager: [phone] Office: [phone] 11/30/2017, 2:14 PM

## 2017-11-30 NOTE — Progress Notes (Signed)
Patient was stable at discharge. I removed their IV. We reviewed the discharge education. Patient/Family verbalized understanding and had no further questions. Patient left with belongings and taxi voucher in hand.

## 2017-12-04 NOTE — Discharge Summary (Signed)
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 Please follow up with PCP in 1 week   Follow-up Information    Clent Demark, PA-C. Schedule an appointment as soon as possible for a visit in 1 week(s).   Specialty:  Physician Assistant Contact information: [address] [place] [address] [phone]        [facility] Follow [facility].   Specialty:  Home Health Services Why:  Physical therapist and RN will contact you.  Contact information: [address] [place] [address] [phone]          Diet recommendation: cardiac diet  Activity: The patient is advised to gradually reintroduce usual activities.  Discharge Condition: good  Code Status: full code  History of present illness: As per the H and P dictated on admission, "Taylor [name] is a 62 y.o. male with medical history significant of  COPD GOLD IV on 3 L of baseline steroid dependent, Colon ca, BiPolar, Thrombocytopenia, Dm 2, Prostate Ca.  Presented with shortness of breath for past 1 day, noted to have severe SOB, no fever or chills, increased lethargy, no sick contacts, increased phlegm production clear, some wheezing, no chest pain but having heavy sensation in his chest.  he has taken Mucinex. He has tried to use Nebulizer at  home with some improvement, He is on 3 L o2 at home    EMS was called on arrival gave 10 mg of albuterol 0.5 mg of Atrovent 125 Solu-Medrol and started on CPAP transitions to BiPAP while in the emergency department and was able to transition off onto 4 L of nasal cannula Recurrent admissions for COPD exacerbations discharged on 25th of  October Regarding pertinent Chronic problems: new diagnosis of Prostate Ca will see urologist this upcoming week   While in ER: Initially on BiPap now on 4L after getting a dose of Ativan."  Hospital Course:  Summary of his active problems in the hospital is as following. COPD exacerbation. Chronic hypercarbic hypoxic respiratory failure. Appreciate pulmonary assistance. Continue nebulizer, continue steroids and doxycycline. Patient will follow-up with pulmonary outpatient.  diabetes mellitus ([place])- Uncontrolled with hyperglycemia without any complication  Resume home medications   . Tobacco abuse- in remission  . HLD (hyperlipidemia)-stable cont home meds . Acute respiratory failure with hypercapnia (HCC)-  Due to COPD exacerbation  Initially on BiPAP,  Improved repeat ABG,  No further BIPAP need in hospital .  . Essential hypertension- not on BP Meds at home will monitor . Abdominal distention- X ray abdominal unremarkable  . Bipolar 1 disorder (HCC)-stable, cont home meds  All other chronic medical condition were stable during the hospitalization.  Patient was seen by physical therapy, who recommended home health, which was arranged by Education officer, museum and case Freight forwarder. On the day of the discharge the patient's vitals were  stable , and no other acute medical condition were reported by patient. the patient was felt safe to be discharge at home with home health.  Consultants: PCCM  Procedures: none  DISCHARGE MEDICATION: Allergies as of 11/30/2017      Reactions   Codeine Nausea And Vomiting      Medication List    TAKE  these medications   ACCU-CHEK AVIVA device Use as instructed   accu-chek soft touch lancets 1 each by Other route 2 (two) times daily. Use as instructed   albuterol 108 (90 Base) MCG/ACT inhaler Commonly known as:  PROVENTIL HFA;VENTOLIN HFA Inhale 2 puffs into the lungs every 6 (six) hours as needed for wheezing or shortness of breath.   albuterol (2.5 MG/3ML) 0.083% nebulizer solution Commonly known as:  PROVENTIL Take 3 mLs (2.5 mg total) by nebulization every 4 (four) hours as needed for wheezing or shortness of breath.   ARIPiprazole 5 MG tablet Commonly known as:  ABILIFY Take 1 tablet (5 mg total) by mouth daily with breakfast.   escitalopram 20 MG tablet Commonly known as:  LEXAPRO Take 1 tablet (20 mg total) by mouth daily.   fluticasone 50 MCG/ACT nasal spray Commonly known as:  FLONASE Place 1 spray into both nostrils daily.   Fluticasone-Umeclidin-Vilant 100-62.5-25 MCG/INH Aepb Inhale 1 puff into the lungs every morning.   folic acid 1 MG tablet Commonly known as:  FOLVITE Take 1 tablet (1 mg total) by mouth daily.   glucose blood test strip 1 each by Other route as needed for other. Use as instructed   guaiFENesin 600 MG 12 hr tablet Commonly known as:  MUCINEX Take 1 tablet (600 mg total) by mouth 2 (two) times daily.   lovastatin 20 MG tablet Commonly known as:  MEVACOR Take 1 tablet (20 mg total) by mouth at bedtime.   metFORMIN 1000 MG tablet Commonly known as:  GLUCOPHAGE Take 1 tablet (1,000 mg total) by mouth 2 (two) times daily with a meal.   OXYGEN Inhale 3 L into the lungs continuous.   predniSONE 10 MG tablet Commonly known as:  DELTASONE Take 50mg  daily for 3days,Take 40mg  daily for 3days,Take 30mg  daily for 3days,Take 20mg  daily for 3days,Take 10mg  daily for 3days, then stop What changed:    medication strength  how much to take  how to take this  when to take this  additional instructions   sodium chloride 0.65 % Soln  nasal spray Commonly known as:  OCEAN Place 1 spray into both nostrils as needed for congestion.   tamsulosin 0.4 MG Caps capsule Commonly known as:  FLOMAX Take 1 capsule (0.4 mg total) by mouth daily.   thiamine 100 MG tablet Take 1 tablet (100 mg total) by mouth daily.     ASK your doctor about these medications   doxycycline 100 MG tablet Commonly known as:  VIBRA-TABS Take 1 tablet (100 mg total) by mouth 2 (two) times daily for 3 days. Ask about: Should I take this medication?      Allergies  Allergen Reactions  . Codeine Nausea And Vomiting   Discharge Instructions    Diet - low sodium heart healthy   Complete by:  As directed    Discharge instructions   Complete by:  As directed    It is important that you read following instructions as well as go over your medication list with RN to help you understand your care after this hospitalization.  Discharge Instructions:  Please follow-up with PCP  in one week  Please request your primary care physician to go over all Hospital Tests and Procedure/Radiological results at the follow up,  Please get all Hospital records sent to your PCP by signing hospital release before you go home.   Do not take more than prescribed Pain, Sleep and Anxiety Medications. You were cared for by a hospitalist during your hospital stay. If you have any questions about your discharge medications or the care you received while you were in the hospital after you are discharged, you can call the unit you were admitted to and ask to speak with the hospitalist on call if the hospitalist that took care of you is not available.  Once you are discharged, your primary care physician will handle any further medical issues. Please note that NO REFILLS for any discharge medications will be authorized once you are discharged, as it is imperative that you return to your primary care physician (or establish a relationship with a primary care physician if you do  not have one) for your aftercare needs so that they can reassess your need for medications and monitor your lab values. You Must read complete instructions/literature along with all the possible adverse reactions/side effects for all the Medicines you take and that have been prescribed to you. Take any new Medicines after you have completely understood and accept all the possible adverse reactions/side effects. Wear Seat belts while driving. If you have smoked or chewed Tobacco in the last 2 yrs please stop smoking and/or stop any Recreational drug use.   Increase activity slowly   Complete by:  As directed      Discharge Exam: Filed Weights   11/28/17 2100  Weight: 76 kg   Vitals:   11/30/17 0902 11/30/17 1228  BP:  117/77  Pulse:  83  Resp:  15  Temp:    SpO2: 96% 99%   General: Appear in no distress, no Rash; Oral Mucosa moist. Cardiovascular: S1 and S2 Present, no Murmur, no JVD Respiratory: Bilateral Air entry present and no Crackles, Occasional  wheezes Abdomen: Bowel Sound present, Soft and no tenderness Extremities: no Pedal edema, no calf tenderness Neurology: Grossly no focal neuro deficit.  The results of significant diagnostics from this hospitalization (including imaging, microbiology, ancillary and laboratory) are listed below for reference.    Significant Diagnostic Studies: Dg Chest 2 View  Result Date: 11/12/2017 CLINICAL DATA:  COPD, shortness of breath EXAM: CHEST - 2 VIEW COMPARISON:  10/24/2017 FINDINGS: There is hyperinflation of the lungs compatible with COPD. Heart and mediastinal contours are within normal limits. No focal opacities or effusions. No acute bony abnormality. Old right rib fractures. IMPRESSION: COPD.  No active disease. Electronically Signed   By: Rolm Baptise M.D.   On: 11/12/2017 23:09   Dg Abd 1 View  Result Date: 11/28/2017 CLINICAL DATA:  62 y/o  M; abdominal distention. EXAM: ABDOMEN - 1 VIEW COMPARISON:  08/31/2017 CT abdomen and  pelvis. FINDINGS: Mild distention of the stomach. No distention of small or large bowel. Moderate volume of stool throughout the colon. Moderate lumbar spondylosis. No radiopaque urinary stone disease identified. IMPRESSION: Nonobstructive bowel gas pattern. Moderate volume of stool throughout the colon. Mild distention of stomach. Electronically Signed   By: Kristine Garbe M.D.   On: 11/28/2017 21:24   Dg Chest Port 1 View  Result Date: 11/28/2017 CLINICAL DATA:  62 y/o  M; 1 day of shortness of breath. EXAM: PORTABLE CHEST 1 VIEW COMPARISON:  11/12/2017 chest radiograph.  FINDINGS: Stable normal cardiac silhouette given projection and technique. No consolidation, effusion, or pneumothorax. Hyperinflated lungs with flattened diaphragms. Upper lobe predominant emphysema. Multiple chronic right-sided rib fractures. No acute osseous abnormality is evident. IMPRESSION: No acute pulmonary process identified. Stable findings of emphysema. Electronically Signed   By: Kristine Garbe M.D.   On: 11/28/2017 18:38    Microbiology: Recent Results (from the past 240 hour(s))  MRSA PCR Screening     Status: None   Collection Time: 11/28/17  9:18 PM  Result Value Ref Range Status   MRSA by PCR NEGATIVE NEGATIVE Final    Comment:        The GeneXpert MRSA Assay (FDA approved for NASAL specimens only), is one component of a comprehensive MRSA colonization surveillance program. It is not intended to diagnose MRSA infection nor to guide or monitor treatment for MRSA infections. Performed at [facility], [place] [address] [place], [place] [address]   Respiratory Panel by PCR     Status: None   Collection Time: 11/28/17 11:00 PM  Result Value Ref Range Status   Adenovirus NOT DETECTED NOT DETECTED Final   Coronavirus 229E NOT DETECTED NOT DETECTED Final   Coronavirus HKU1 NOT DETECTED NOT DETECTED Final   Coronavirus NL63 NOT DETECTED NOT DETECTED Final   Coronavirus OC43 NOT  DETECTED NOT DETECTED Final   Metapneumovirus NOT DETECTED NOT DETECTED Final   Rhinovirus / Enterovirus NOT DETECTED NOT DETECTED Final   Influenza A NOT DETECTED NOT DETECTED Final   Influenza B NOT DETECTED NOT DETECTED Final   Parainfluenza Virus 1 NOT DETECTED NOT DETECTED Final   Parainfluenza Virus 2 NOT DETECTED NOT DETECTED Final   Parainfluenza Virus 3 NOT DETECTED NOT DETECTED Final   Parainfluenza Virus 4 NOT DETECTED NOT DETECTED Final   Respiratory Syncytial Virus NOT DETECTED NOT DETECTED Final   Bordetella pertussis NOT DETECTED NOT DETECTED Final   Chlamydophila pneumoniae NOT DETECTED NOT DETECTED Final   Mycoplasma pneumoniae NOT DETECTED NOT DETECTED Final    Comment: Performed at [facility], [place] [address] [place], [place] [address]     Labs: CBC: Recent Labs  Lab 11/28/17 1812 11/28/17 1826 11/29/17 0508 11/30/17 0302  WBC 14.3*  --  4.6 14.3*  NEUTROABS 8.7*  --   --   --   HGB 14.3 15.0 12.7* 11.8*  HCT 46.6 44.0 39.2 37.0*  MCV 95.9  --  89.9 90.9  PLT 274  --  198 681   Basic Metabolic Panel: Recent Labs  Lab 11/28/17 1812 11/28/17 1826 11/29/17 0508 11/30/17 0302  NA 136 135 136 136  K 4.7 4.8 4.6 4.4  CL 95* 98 98 99  CO2 26  --  31 30  GLUCOSE 175* 177* 200* 194*  BUN 20 24* 17 20  CREATININE 0.97 0.90 0.77 0.88  CALCIUM 9.1  --  8.8* 8.6*  MG  --   --  1.9  --   PHOS  --   --  2.8  --    Liver Function Tests: Recent Labs  Lab 11/28/17 1812 11/29/17 0508  AST 23 20  ALT 21 20  ALKPHOS 55 46  BILITOT 0.7 0.6  PROT 6.9 6.2*  ALBUMIN 4.4 3.7   No results for input(s): LIPASE, AMYLASE in the last 168 hours. No results for input(s): AMMONIA in the last 168 hours. Cardiac Enzymes: Recent Labs  Lab 11/28/17 2306 11/29/17 0508 11/29/17 1110  TROPONINI <0.03 <0.03 <0.03  BNP (last 3 results) Recent Labs    06/13/17 1003 08/24/17 1838 09/10/17 2215  BNP 33.8 34.7 94.6   CBG: Recent Labs  Lab 11/28/17 2351  11/29/17 1838 11/29/17 2222 11/30/17 1227  GLUCAP 164* 199* 161* 142*   Time spent: 35 minutes  Signed:  Berle Mull  [facility] 11/30/2017   , 3:24 PM
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
## 2017-12-21 ENCOUNTER — Other Ambulatory Visit: Payer: Self-pay

## 2017-12-21 ENCOUNTER — Encounter (HOSPITAL_COMMUNITY): Payer: Self-pay

## 2017-12-21 ENCOUNTER — Emergency Department (HOSPITAL_COMMUNITY)
Admission: EM | Admit: 2017-12-21 | Discharge: 2017-12-21 | Disposition: A | Discharge status: Home / Self Care | Payer: Medicaid Other | Attending: Emergency Medicine | Admitting: Emergency Medicine

## 2017-12-21 ENCOUNTER — Emergency Department (HOSPITAL_COMMUNITY): Payer: Medicaid Other

## 2017-12-21 DIAGNOSIS — Z8546 Personal history of malignant neoplasm of prostate: Secondary | ICD-10-CM | POA: Insufficient documentation

## 2017-12-21 DIAGNOSIS — Z79899 Other long term (current) drug therapy: Secondary | ICD-10-CM | POA: Insufficient documentation

## 2017-12-21 DIAGNOSIS — J441 Chronic obstructive pulmonary disease with (acute) exacerbation: Secondary | ICD-10-CM | POA: Insufficient documentation

## 2017-12-21 DIAGNOSIS — F17210 Nicotine dependence, cigarettes, uncomplicated: Secondary | ICD-10-CM | POA: Insufficient documentation

## 2017-12-21 DIAGNOSIS — Z7984 Long term (current) use of oral hypoglycemic drugs: Secondary | ICD-10-CM | POA: Insufficient documentation

## 2017-12-21 DIAGNOSIS — E119 Type 2 diabetes mellitus without complications: Secondary | ICD-10-CM | POA: Insufficient documentation

## 2017-12-21 DIAGNOSIS — R0602 Shortness of breath: Secondary | ICD-10-CM | POA: Diagnosis present

## 2017-12-21 DIAGNOSIS — I10 Essential (primary) hypertension: Secondary | ICD-10-CM | POA: Diagnosis not present

## 2017-12-21 DIAGNOSIS — E785 Hyperlipidemia, unspecified: Secondary | ICD-10-CM | POA: Insufficient documentation

## 2017-12-21 LAB — BASIC METABOLIC PANEL
ANION GAP: 9 (ref 5–15)
BUN: 12 mg/dL (ref 8–23)
CALCIUM: 8.7 mg/dL — AB (ref 8.9–10.3)
CO2: 35 mmol/L — ABNORMAL HIGH (ref 22–32)
CREATININE: 0.88 mg/dL (ref 0.61–1.24)
Chloride: 98 mmol/L (ref 98–111)
Glucose, Bld: 99 mg/dL (ref 70–99)
Potassium: 3.6 mmol/L (ref 3.5–5.1)
Sodium: 142 mmol/L (ref 135–145)

## 2017-12-21 LAB — CBC WITH DIFFERENTIAL/PLATELET
Abs Immature Granulocytes: 0.13 10*3/uL — ABNORMAL HIGH (ref 0.00–0.07)
BASOS PCT: 1 %
Basophils Absolute: 0 10*3/uL (ref 0.0–0.1)
Eosinophils Absolute: 0.1 10*3/uL (ref 0.0–0.5)
Eosinophils Relative: 2 %
HCT: 41.7 % (ref 39.0–52.0)
Hemoglobin: 12.5 g/dL — ABNORMAL LOW (ref 13.0–17.0)
IMMATURE GRANULOCYTES: 2 %
Lymphocytes Relative: 21 %
Lymphs Abs: 1.6 10*3/uL (ref 0.7–4.0)
MCH: 28.5 pg (ref 26.0–34.0)
MCHC: 30 g/dL (ref 30.0–36.0)
MCV: 95.2 fL (ref 80.0–100.0)
Monocytes Absolute: 0.7 10*3/uL (ref 0.1–1.0)
Monocytes Relative: 10 %
NEUTROS PCT: 64 %
NRBC: 0 % (ref 0.0–0.2)
Neutro Abs: 4.9 10*3/uL (ref 1.7–7.7)
PLATELETS: 220 10*3/uL (ref 150–400)
RBC: 4.38 MIL/uL (ref 4.22–5.81)
RDW: 13.7 % (ref 11.5–15.5)
WBC: 7.5 10*3/uL (ref 4.0–10.5)

## 2017-12-21 LAB — I-STAT TROPONIN, ED: TROPONIN I, POC: 0 ng/mL (ref 0.00–0.08)

## 2017-12-21 LAB — BRAIN NATRIURETIC PEPTIDE: B Natriuretic Peptide: 27.5 pg/mL (ref 0.0–100.0)

## 2017-12-21 MED ORDER — ALBUTEROL SULFATE (2.5 MG/3ML) 0.083% IN NEBU
5.0000 mg | INHALATION_SOLUTION | Freq: Once | RESPIRATORY_TRACT | Status: AC
  Administered 2017-12-21: 5 mg via RESPIRATORY_TRACT
  Filled 2017-12-21: qty 6

## 2017-12-21 MED ORDER — PREDNISONE 20 MG PO TABS: ORAL_TABLET | ORAL | 0 refills | Status: DC

## 2017-12-21 MED ORDER — METHYLPREDNISOLONE SODIUM SUCC 125 MG IJ SOLR
125.0000 mg | Freq: Once | INTRAMUSCULAR | Status: AC
  Administered 2017-12-21: 125 mg via INTRAVENOUS
  Filled 2017-12-21: qty 2

## 2017-12-21 NOTE — ED Notes (Signed)
[facility] called @ 1835-per Dr. Deforest Hoyles by Levada Dy

## 2017-12-21 NOTE — ED Notes (Signed)
Patient transported to X-ray 

## 2017-12-21 NOTE — ED Notes (Signed)
Walked in hall, O2 stayed above 95 but pt states he is "a little woozy"

## 2017-12-21 NOTE — ED Provider Notes (Signed)
[facility] EMERGENCY DEPARTMENT Provider Note   CSN: [id number] Arrival date & time: 12/21/17  1525     History   Chief Complaint Chief Complaint  Patient presents with  . Chest Pain  . COPD    HPI Taylor [name] is a 62 y.o. male.  Patient is a 62 year old male with a history of COPD, DM, HTN, and bipolar disorder who presents with shortness of breath.  He states he has had increasing shortness of breath since yesterday.  He has had trouble walking short distances without getting short of breath.  He has some pain in his chest that goes all the way across his chest.  It is worse with movement.  He has a cough which is mostly nonproductive.  No known fevers.  No nausea or vomiting.  No leg pain or swelling.  He is on home oxygen at 3 L/min.  He has been using his nebulizer treatments at home without improvement in symptoms.  He got a nebulizer treatment and nitroglycerin in route by EMS with some improvement in symptoms.     Past Medical History:  Diagnosis Date  . Asthma   . Bipolar 1 disorder ([place]) 02/05/2012  . Colon cancer ([place]) 04/11/11   adenocarcinoma of colon, 7/19 nodes pos.FINISHED CHEMO/DR. SHERRILL  . COPD (chronic obstructive pulmonary disease) ([place])    SMOKER  . Depression   . Emphysema of lung ([place])   . Full dentures   . Hemorrhoids   . On home oxygen therapy    "3L; 24/7" (05/29/2017)  . Oxygen deficiency   . Pneumonia ~ 2016   "double pneumonia"  . Prostate cancer ([place])    Gleason score = 7, supposed to have radiation therapy but he has not followed up (05/29/2017)  . Rib fractures    hx of    Patient Active Problem List   Diagnosis Date Noted  . Abdominal distention 11/28/2017  . Chronic respiratory failure with hypoxia ([place]) 11/19/2017  . Essential hypertension 11/13/2017  . Type 2 diabetes mellitus ([place]) 10/24/2017  . Adjustment disorder with anxiety   . Sexually offensive behavior/Sex Offender (Minor male child)  09/23/2017  . Hypoxic Respiratory failure, acute and chronic ([place]) 09/22/2017  . Palliative care by specialist   . DNR (do not resuscitate)   . Chronic respiratory failure with hypoxia and hypercapnia ([place]) 09/14/2017  . Acute on chronic respiratory failure with hypoxia ([place]) 09/11/2017  . Anxiety and depression   . Prostate cancer ([place]) 09/04/2017  . Acute respiratory failure with hypercapnia ([place])   . SOB (shortness of breath)   . Malnutrition of moderate degree 06/14/2017  . Hyperglycemia 06/08/2017  . Constipation 06/08/2017  . SIRS (systemic inflammatory response syndrome) ([place]) 05/21/2017  . Tobacco abuse 05/13/2017  . HLD (hyperlipidemia) 05/13/2017  . Leukocytosis 04/06/2017  . Adjustment disorder with depressed mood 03/28/2017  . Normocytic normochromic anemia 03/24/2017  . COPD with acute exacerbation ([place]) 10/22/2016  . Alcohol abuse 01/09/2013  . COPD exacerbation ([place]) 01/09/2013  . Smoker 12/16/2012  . Inguinal hernia unilateral, non-recurrent, right 05/01/2012  . Dyspepsia 02/05/2012  . Bipolar 1 disorder ([place]) 02/05/2012  . 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 COPD  GOLD III 02/03/2012  . Thrombocytopenia ([place]) 02/03/2012  . Depression   . 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 COLON SURGERY  04/11/11   Sigmoid colectomy  . COLOSTOMY REVISION  04/11/2011   Procedure: COLON RESECTION SIGMOID;  Surgeon: [name], MD;  Location: [facility]S;  Service: General;  Laterality: N/A;  low anterior colon resection   . HERNIA REPAIR    . INGUINAL HERNIA REPAIR Right 05/01/2012   Procedure: HERNIA REPAIR INGUINAL ADULT;  Surgeon: [name], MD;  Location: [facility];  Service: General;  Laterality: Right;  . INSERTION OF MESH Right 05/01/2012   Procedure: INSERTION OF MESH;  Surgeon: [name], MD;  Location: [facility];  Service: General;  Laterality: Right;  . PORT-A-CATH REMOVAL Left 05/01/2012    Procedure: REMOVAL Infusion Port;  Surgeon: [name], MD;  Location: [facility];  Service: General;  Laterality: Left;  . PORTACATH PLACEMENT  05/02/2011   Procedure: INSERTION PORT-A-CATH;  Surgeon: [name], MD;  Location: [facility];  Service: General;  Laterality: N/A;  . PROSTATE BIOPSY          Home Medications    Prior to Admission medications   Medication Sig Start Date End Date Taking? Authorizing Provider  albuterol (PROVENTIL HFA;VENTOLIN HFA) 108 (90 Base) MCG/ACT inhaler Inhale 2 puffs into the lungs every 6 (six) hours as needed for wheezing or shortness of breath. 10/14/17   Varney Biles, MD  albuterol (PROVENTIL) (2.5 MG/3ML) 0.083% nebulizer solution Take 3 mLs (2.5 mg total) by nebulization every 4 (four) hours as needed for wheezing or shortness of breath. 11/18/17   [name], MD  ARIPiprazole (ABILIFY) 5 MG tablet Take 1 tablet (5 mg total) by mouth daily with breakfast. 11/25/17   Virgel Manifold, MD  Blood Glucose Monitoring Suppl (ACCU-CHEK AVIVA) device Use as instructed 10/27/17 10/27/18  Clent Demark, PA-C  escitalopram (LEXAPRO) 20 MG tablet Take 1 tablet (20 mg total) by mouth daily. 11/25/17   Virgel Manifold, MD  fluticasone (FLONASE) 50 MCG/ACT nasal spray Place 1 spray into both nostrils daily. 12/01/17   Lavina Hamman, MD  Fluticasone-Umeclidin-Vilant (TRELEGY ELLIPTA) 100-62.5-25 MCG/INH AEPB Inhale 1 puff into the lungs every morning. 11/18/17   [name], MD  folic acid (FOLVITE) 1 MG tablet Take 1 tablet (1 mg total) by mouth daily. 10/14/17   Varney Biles, MD  glucose blood (ACCU-CHEK AVIVA) test strip 1 each by Other route as needed for other. Use as instructed 10/27/17   Clent Demark, PA-C  guaiFENesin (MUCINEX) 600 MG 12 hr tablet Take 1 tablet (600 mg total) by mouth 2 (two) times daily. 11/30/17   Lavina Hamman, MD  Lancets (ACCU-CHEK SOFT [facility]) lancets 1 each by Other route 2 (two) times daily. Use as instructed 10/27/17    Clent Demark, PA-C  lovastatin (MEVACOR) 20 MG tablet Take 1 tablet (20 mg total) by mouth at bedtime. 10/27/17   Clent Demark, PA-C  metFORMIN (GLUCOPHAGE) 1000 MG tablet Take 1 tablet (1,000 mg total) by mouth 2 (two) times daily with a meal. 10/27/17   Clent Demark, PA-C  OXYGEN Inhale 3 L into the lungs continuous.     [provider]  predniSONE (DELTASONE) 20 MG tablet 2 tabs po daily x 4 days 12/21/17   Malvin Johns, MD  sodium chloride (OCEAN) 0.65 % SOLN nasal spray Place 1 spray into both nostrils as needed for congestion. 11/30/17   Lavina Hamman, MD  tamsulosin (FLOMAX) 0.4 MG CAPS capsule Take 1 capsule (0.4 mg total) by mouth daily. 10/27/17   Clent Demark, PA-C  thiamine 100 MG tablet Take 1 tablet (100 mg total) by mouth daily. 10/14/17  Varney Biles, MD    Family History Family History  Problem Relation Age of Onset  . Heart disease Father   . Lung cancer Maternal Uncle        smoked    Social History Social History   Tobacco Use  . Smoking status: Current Some Day Smoker    Packs/day: 0.10    Years: 46.00    Pack years: 4.60    Types: Cigarettes    Last attempt to quit: 05/21/2017    Years since quitting: 0.5  . Smokeless tobacco: Former Systems developer    Types: Chew  Substance Use Topics  . Alcohol use: Not Currently    Comment: h/o use in the past, no h/o heavy use  . Drug use: No     Allergies   Codeine   Review of Systems Review of Systems  Constitutional: Positive for fatigue. Negative for chills, diaphoresis and fever.  HENT: Positive for rhinorrhea. Negative for congestion and sneezing.   Eyes: Negative.   Respiratory: Positive for cough and shortness of breath. Negative for chest tightness.   Cardiovascular: Positive for chest pain. Negative for leg swelling.  Gastrointestinal: Negative for abdominal pain, blood in stool, diarrhea, nausea and vomiting.  Genitourinary: Negative for difficulty urinating, flank pain,  frequency and hematuria.  Musculoskeletal: Negative for arthralgias and back pain.  Skin: Negative for rash.  Neurological: Negative for dizziness, speech difficulty, weakness, numbness and headaches.     Physical Exam Updated Vital Signs BP 130/85   Pulse 80   Temp 97.7 F (36.5 C) (Oral)   Resp (!) 22   Ht 5\' 11"  (1.803 m)   Wt 77.1 kg   SpO2 96%   BMI 23.71 kg/m   Physical Exam  Constitutional: He is oriented to person, place, and time. He appears well-developed and well-nourished.  HENT:  Head: Normocephalic and atraumatic.  Eyes: Pupils are equal, round, and reactive to light.  Neck: Normal range of motion. Neck supple.  Cardiovascular: Normal rate, regular rhythm and normal heart sounds.  Pulmonary/Chest: Effort normal and breath sounds normal. Tachypnea noted. No respiratory distress. He has no wheezes. He has no rales. He exhibits no tenderness.  Diminished breath sounds bilaterally  Abdominal: Soft. Bowel sounds are normal. There is no tenderness. There is no rebound and no guarding.  Musculoskeletal: Normal range of motion. He exhibits no edema.  No edema or calf tenderness  Lymphadenopathy:    He has no cervical adenopathy.  Neurological: He is alert and oriented to person, place, and time.  Skin: Skin is warm and dry. No rash noted.  Psychiatric: He has a normal mood and affect.     ED Treatments / Results  Labs (all labs ordered are listed, but only abnormal results are displayed) Labs Reviewed  BASIC METABOLIC PANEL - Abnormal; Notable for the following components:      Result Value   CO2 35 (*)    Calcium 8.7 (*)    All other components within normal limits  CBC WITH DIFFERENTIAL/PLATELET - Abnormal; Notable for the following components:   Hemoglobin 12.5 (*)    Abs Immature Granulocytes 0.13 (*)    All other components within normal limits  BRAIN NATRIURETIC PEPTIDE  I-STAT TROPONIN, ED    EKG EKG Interpretation  Date/Time:  Sunday December 21 2017 15:30:57 EST Ventricular Rate:  128 PR Interval:    QRS Duration: 110 QT Interval:  342 QTC Calculation: 505 R Axis:   -115 Text Interpretation:  Sinus tachycardia  Atrial premature complex Inferior infarct, old Lateral leads are also involved Prolonged QT interval SINCE LAST TRACING HEART RATE HAS INCREASED Confirmed by Malvin Johns [phone]) on 12/21/2017 3:40:57 PM   Radiology Dg Chest 2 View  Result Date: 12/21/2017 CLINICAL DATA:  Shortness of breath for 2 days.  Chest pain. EXAM: CHEST - 2 VIEW COMPARISON:  11/28/2017 FINDINGS: Cardiomediastinal silhouette is normal. Mediastinal contours appear intact. Calcific atherosclerotic disease of the aorta. There is no evidence of focal airspace consolidation, pleural effusion or pneumothorax. Osseous structures are without acute abnormality. Healed right-sided rib fractures. Soft tissues are grossly normal. IMPRESSION: No active cardiopulmonary disease. Electronically Signed   By: Fidela Salisbury M.D.   On: 12/21/2017 16:27    Procedures Procedures (including critical care time)  Medications Ordered in ED Medications  methylPREDNISolone sodium succinate (SOLU-MEDROL) 125 mg/2 mL injection 125 mg (125 mg Intravenous Given 12/21/17 1549)  albuterol (PROVENTIL) (2.5 MG/3ML) 0.083% nebulizer solution 5 mg (5 mg Nebulization Given 12/21/17 1657)     Initial Impression / Assessment and Plan / ED Course  I have reviewed the triage vital signs and the nursing notes.  Pertinent labs & imaging results that were available during my care of the patient were reviewed by me and considered in my medical decision making (see chart for details).     Patient is a 62 year old male who presents with shortness of breath.  His symptoms are consistent with a COPD exacerbation.  He received nebulizer treatments and steroids in the ED.  He is maintaining normal oxygen saturations on his baseline oxygen.  He feels much better and feels like he just now  has some nasal congestion but his shortness of breath is much better.  He is able to ambulate without hypoxia.  He has no fever.  No suggestions of pneumonia on x-ray.  No signs of fluid overload.  No concerns for ACS.  He was discharged home in good condition.  He was started on prednisone burst.  He was encouraged to follow-up with his PCP.  Return precautions were given.  Final Clinical Impressions(s) / ED Diagnoses   Final diagnoses:  COPD exacerbation [facility])    ED Discharge Orders         Ordered    predniSONE (DELTASONE) 20 MG tablet     12/21/17 1827           Malvin Johns, MD 12/21/17 1831

## 2017-12-21 NOTE — ED Triage Notes (Signed)
Per [facility], pt from home w/ a c/o SOB and CP since yesterday w/ a hx of smoking and COPD. Pt using home O2 at 3 lpm and has had several self administered neb tx since yesterday w/o relief. Additional complaints of a cough for the past two days. CP is centrally located.   Tx by EMS: duoneb - 5 mg albuterol/0.5 mg Atrovent 20 ga IV R AC  126/93 HR 80 RR 24 CBG 112 99 - 100% SpO2

## 2017-12-29 ENCOUNTER — Ambulatory Visit: Payer: Medicaid Other

## 2017-12-29 ENCOUNTER — Ambulatory Visit (INDEPENDENT_AMBULATORY_CARE_PROVIDER_SITE_OTHER): Payer: Medicaid Other | Admitting: Internal Medicine

## 2017-12-29 ENCOUNTER — Encounter: Payer: Self-pay | Admitting: Internal Medicine

## 2017-12-29 ENCOUNTER — Telehealth: Payer: Self-pay | Admitting: Internal Medicine

## 2017-12-29 ENCOUNTER — Ambulatory Visit: Payer: Medicaid Other | Admitting: Radiation Oncology

## 2017-12-29 VITALS — BP 104/60 | HR 84 | Ht 72.0 in | Wt 172.0 lb

## 2017-12-29 DIAGNOSIS — J449 Chronic obstructive pulmonary disease, unspecified: Secondary | ICD-10-CM | POA: Diagnosis not present

## 2017-12-29 DIAGNOSIS — J9612 Chronic respiratory failure with hypercapnia: Secondary | ICD-10-CM

## 2017-12-29 DIAGNOSIS — J9611 Chronic respiratory failure with hypoxia: Secondary | ICD-10-CM

## 2017-12-29 LAB — PULMONARY FUNCTION TEST
DL/VA % PRED: 61 %
DL/VA: 2.82 ml/min/mmHg/L
DLCO COR % PRED: 35 %

## 2017-12-29 MED ORDER — PREDNISONE 10 MG PO TABS: ORAL_TABLET | ORAL | 0 refills | Status: DC

## 2017-12-29 NOTE — Progress Notes (Signed)
Subjective:    Patient ID: Taylor [name], male    DOB: 12/08/1955 MRN: [id number]    Brief patient profile:  62  yowm quit smoking 2015  sinus congestion with spring just in 2014 took a few clariton referred by ER for sob after evaluation there for ? IHD > ruled out clinically but GOLD IV COPD documented 12/15/2012    History of Present Illness   Previous eval at [facility] for sob Jan 2014 Admit date: 02/03/2012  Discharge date: 02/06/2012  Discharge Diagnoses:  Principal Problem:  *COPD (chronic obstructive pulmonary disease)  Active Problems:  Colon cancer, sigmoid  Thrombocytopenia  Hyperglycemia  Dyspepsia  Bipolar 1 disorder  Depression      06/17/2013 f/u ov/Taylor [name] re: GOLD IV/ still smoking / supposed to be on spiriva/dulera  Chief Complaint  Patient presents with  . Acute Visit    Pt c/o increased SOB and chest tightness for the past month.  He is using proair approx 4 times per day.  He also c/o prod cough with minimal clear sputum.   confused with meds by name/ most of his cough is am worse in cold weather  rec Prednisone 10 mg take  4 each am x 2 days,   2 each am x 2 days,  1 each am x 2 days and stop  Dulera 200 Take 2 puffs first thing in am and then another 2 puffs about 12 hours later.  Only use your albuterol (proair)as a rescue medication  Pantoprazole (protonix) 40 mg   Take 30-60 min before first meal of the day and Pepcid 20 mg one bedtime until return to office GERD diet            06/05/2016 acute extended ov/Taylor [name] re:  Copd III/ quit smoking 2015 maint on dulera /spiriva  Chief Complaint  Patient presents with  . Breathing Issues    Increased SOB for the past 2 weeks. Has been in the ER twice for this. States that his current inhalers are not working. Feels like he is about to pass out at times.   One month prior to OV  "ok" on dulera / spiriva  But still daily hfa albuterol /albuterol neb and has needed this for at least 10 years Gradually worse  sob x 2 weeks to point even sob at rest Wakes up and immediately grabs for inhaler x 2 weeks prednisone helps transiently each time he takes it  rec Prednisone 10 mg take 2 daily with breakfast until better then 1 daily Prilosec Take two of them  30-60 min before first meal of the day and pepcid 20 mg at bedtime until return GERD diet  Plan A = Automatic = first thing in am Dulera 200 x 2 and spiriva x 2 then the duelra  200 x 2  At 12 hours later Plan B = Backup Only use your albuterol as a rescue medication   Plan C = Crisis - only use your albuterol nebulizer if you first try Plan B    Admit date: 11/12/2017 Discharge date: 11/14/2017  Admitted From: HOME.  Disposition: SNF  Recommendations for Outpatient Follow-up:  1. Follow up with PCP in 1-2 weeks 2. Please obtain BMP/CBC in one week Please follow up with Dr Melvyn Novas next week as scheduled Recommend follow up with [facility] next month.   Discharge Condition:stable.  CODE STATUS: full code.  Diet recommendation: Heart Healthy  Brief/Interim Summary: Taylor Coutts Hendersonis a 62 y.o.malewith medical history significant forsteroid-dependent  COPD, depression with anxiety, type 2 diabetes mellitus, and recent diagnosis of prostate cancer, now presenting to the emergency department for evaluation of increased shortness of breath and wheezing. He was admitted for copd exacerbation.   Discharge Diagnoses:  Principal Problem:   COPD with acute exacerbation ([place]) Active Problems:   Prostate cancer ([place])   Anxiety and depression   Adjustment disorder with anxiety   Type 2 diabetes mellitus ([place])   Essential hypertension     11/18/2017  f/u ov/Taylor [name] re: post hosp f/u - re-establish care  Chief Complaint  Patient presents with  . Hospitalization Follow-up    Hospital 11/13/2017, currently on prednisone. Antibiotic was prescribed, patient has not picked up from pharmacy. States his breathing is better since  hospital visit. His sugars have been running high with prednisone.   Breo this am   instead of trelegy  - not apparent problem with trelegy not clar why changed to breo ? hosp/insurance restrictions?  - denies bladder problem while on lama ? If hx accurate  02 2lpm 24/7 Still using alb q 6 h despite rx with maint breo  Doe  = MMRC4  = sob if tries to leave home or while getting dressed   rec Prednisone 10 mg take 2 daily with breakfast until better then 1 daily Prilosec Take two of them  30-60 min before first meal of the day and pepcid 20 mg at bedtime until return GERD  Diet  Plan A = Automatic = first thing in am Dulera 200 x 2 and spiriva x 2 then the duelra  200 x 2  At 12 hours later Plan B = Backup Only use your albuterol as a rescue medication   Plan C = Crisis - only use your albuterol nebulizer if you first try Plan B and it fails to help > ok to use the nebulizer up to every 4 hours but if start needing it regularly call for immediate appointment    12/29/2017  f/u ov/Taylor [name] re:  GOLD IV copd/ 02 dep @ 3lpm / cannot afford anything but nebs and no better p trial of trelegy samples Chief Complaint  Patient presents with  . Follow-up    PFT's done today.  Breathing has been same since "I ran out of everything" out of trelegy and albuterol inhaler approx 2 wks ago. He has been using duonebs approx 4 x per day.   Dyspnea:  MMRC4  = sob if tries to leave home or while getting dressed   Cough: none Sleeping: on side bed flat  SABA use: duoneb qid  02: 3lpm   24/7  No obvious day to day or daytime variability or assoc excess/ purulent sputum or mucus plugs or hemoptysis or cp or chest tightness, subjective wheeze or overt sinus or hb symptoms.   Sleeping as above  without nocturnal  or early am exacerbation  of respiratory  c/o's or need for noct saba. Also denies any obvious fluctuation of symptoms with weather or environmental changes or other aggravating or alleviating factors except  as outlined above   No unusual exposure hx or h/o childhood pna/ asthma or knowledge of premature birth.  Current Allergies, Complete Past Medical History, Past Surgical History, Family History, and Social History were reviewed in [employer] record.  ROS  The following are not active complaints unless bolded Hoarseness, sore throat, dysphagia, dental problems, itching, sneezing,  nasal congestion or discharge of excess mucus or purulent secretions, ear ache,  fever, chills, sweats, unintended wt loss or wt gain, classically pleuritic or exertional cp,  orthopnea pnd or arm/hand swelling  or leg swelling, presyncope, palpitations, abdominal pain, anorexia, nausea, vomiting, diarrhea  or change in bowel habits or change in bladder habits, change in stools or change in urine, dysuria, hematuria,  rash, arthralgias, visual complaints, headache, numbness, weakness or ataxia or problems with walking or coordination,  change in mood= anxious or  memory.        Current Meds  Medication Sig  . albuterol (PROVENTIL HFA;VENTOLIN HFA) 108 (90 Base) MCG/ACT inhaler Inhale 2 puffs into the lungs every 6 (six) hours as needed for wheezing or shortness of breath.  . ARIPiprazole (ABILIFY) 5 MG tablet Take 1 tablet (5 mg total) by mouth daily with breakfast.  . Blood Glucose Monitoring Suppl (ACCU-CHEK AVIVA) device Use as instructed  . escitalopram (LEXAPRO) 20 MG tablet Take 1 tablet (20 mg total) by mouth daily.  . fluticasone (FLONASE) 50 MCG/ACT nasal spray Place 1 spray into both nostrils daily.  . folic acid (FOLVITE) 1 MG tablet Take 1 tablet (1 mg total) by mouth daily.  Marland Kitchen glucose blood (ACCU-CHEK AVIVA) test strip 1 each by Other route as needed for other. Use as instructed  . guaiFENesin (MUCINEX) 600 MG 12 hr tablet Take 1 tablet (600 mg total) by mouth 2 (two) times daily.  . Lancets (ACCU-CHEK SOFT TOUCH) lancets 1 each by Other route 2 (two) times daily. Use as instructed   . lovastatin (MEVACOR) 20 MG tablet Take 1 tablet (20 mg total) by mouth at bedtime.  . metFORMIN (GLUCOPHAGE) 1000 MG tablet Take 1 tablet (1,000 mg total) by mouth 2 (two) times daily with a meal.  . OXYGEN Inhale 3 L into the lungs continuous.   . sodium chloride (OCEAN) 0.65 % SOLN nasal spray Place 1 spray into both nostrils as needed for congestion.  . tamsulosin (FLOMAX) 0.4 MG CAPS capsule Take 1 capsule (0.4 mg total) by mouth daily.  Marland Kitchen thiamine 100 MG tablet Take 1 tablet (100 mg total) by mouth daily.                Objective:   Physical Exam   amb wm  nad on 02   11/18/2017     169  06/05/2016       177 06/17/2013       178  > 08/09/2013 184 >  11/09/2013  176  Wt Readings from Last 3 Encounters:  02/22/13 186 lb (84.369 kg)  01/25/13 180 lb 9.6 oz (81.92 kg)  01/18/13 156 lb 15.5 oz (71.2 kg)       Vital signs reviewed - Note on arrival 02 sats  95% on 2lpm      HEENT: Partial top and bottom dentures/ nl oropharynx. Nl external ear canals without cough reflex -  Mod bilateral non-specific turbinate edema        HEENT: top and bottom dentures oropharynx. Nl external ear canals without cough reflex -  Mild bilateral non-specific turbinate edema     NECK :  without JVD/Nodes/TM/ nl carotid upstrokes bilaterally   LUNGS: no acc muscle use,  Mod barrel  contour chest wall with bilateral  Distant bs s audible wheeze and  without cough on insp or exp maneuver and mod  Hyperresonant  to  percussion bilaterally     CV:  RRR  no s3 or murmur or increase in P2, and no edema   ABD:  soft and  nontender with pos mid insp Hoover's  in the supine position. No bruits or organomegaly appreciated, bowel sounds nl  MS:   Nl gait/  ext warm without deformities, calf tenderness, cyanosis  - mod  clubbing No obvious joint restrictions   SKIN: warm and dry without lesions    NEURO:  alert, approp, nl sensorium with  no motor or cerebellar deficits apparent.        I  personally reviewed images and agree with radiology impression as follows:  CXR:   12/21/17 No active cardiopulmonary disease. My impression:  Severe hyperinflation present      Assessment & Plan:

## 2017-12-29 NOTE — Assessment & Plan Note (Signed)
-   09/17/2012  Walked RA x 3 laps @ 185 ft each stopped due to  End of study, no desat - Spirometry 09/17/2012 FEV1  0.80 (21%) ratio 52  - PFT's 12/15/2012  FEV1  0.89 (24%) with Ratio 38 p am saba and DLCO 44% corrects to 68%  - PFTs 12/07/13      FEV1   1.18 (32%)  Ratio 40  And no change p saba DLCO 50 corrects to 68% took neither spiriva nor dulera before pfts - Alpha one screen 12/15/2012 >  MM -   06/05/2016 p extensive coaching HFA effectiveness =    90% from a baseline of 50% (Ti too short) - 06/05/2016 changed spiriva to respimat x one sample  And started daily prednisone  10/17/2017 Three hospital admissions for COPD exacerbation. Not taking dulera or spirivia currently. Can't afford medication. Changed maintenance inhaler to Trelegy to enhance compliance and given patient assistance  - 11/18/2017  After extensive coaching inhaler device,  effectiveness =    90% with elipta  - PFT's  12/29/2017  FEV1 0.58 (16 % ) ratio 38  p 7 % improvement from saba p nothing prior to study  with DLCO  32/25c % corrects to 61 % for alv volume with classic copd curvature   Unfortunately approaching endstage dz and no better on trelegy which he says he can't afford anyway ? May be a candidate for lama/laba per neb but in meantime rec have prednisone on hand for prn use ie  Take 10 mg x 2 until better then 10 mg per day x 5 days and stop.

## 2017-12-29 NOTE — Progress Notes (Signed)
Spirometry and Dlco done today. 

## 2017-12-29 NOTE — Patient Instructions (Addendum)
Ok use duoneb up to 4 x in 24 hours  If breathing gets worse:  Prednisone 10 mg x 2 each am until better then 1 daily x 5 days and stop    Please schedule a follow up visit in 3 months but call sooner if needed  - consider lama/laba neb next ov if not doing will on duoneb qid

## 2017-12-29 NOTE — Telephone Encounter (Signed)
Called and spoke to Ankit with [facility], who is requesting additional information on Prednisone Rx. Rx for prednisone was [facility] to [facility] today with sig take as directed.  I have relayed below directions to Ankit per 12/29/17 OV note.  Nothing further is needed.     [name], MD (Physician) at 12/29/2017 12:33 PM - Signed    Ok use duoneb up to 4 x in 24 hours  If breathing gets worse:  Prednisone 10 mg x 2 each am until better then 1 daily x 5 days and stop    Please schedule a follow up visit in 3 months but call sooner if needed  - consider lama/laba neb next ov if not doing will on duoneb qid

## 2017-12-29 NOTE — Assessment & Plan Note (Signed)
HCO3  12/21/17  = 35    Adequate control on present rx, reviewed in detail with pt > no change in rx needed  = 3lpm 24/7    I had an extended discussion with the patient reviewing all relevant studies completed to date and  lasting 15 to 20 minutes of a 25 minute visit    Each maintenance medication was reviewed in detail including most importantly the difference between maintenance and prns and under what circumstances the prns are to be triggered using an action plan format that is not reflected in the computer generated alphabetically organized AVS.     Please see AVS for specific instructions unique to this visit that I personally wrote and verbalized to the the pt in detail and then reviewed with pt  by my nurse highlighting any  changes in therapy recommended at today's visit to their plan of care.     >>> f/u q 3 m, sooner if needed

## 2017-12-30 NOTE — Progress Notes (Signed)
GU Location of Tumor / Histology: prostatic adenocarcinoma  If Prostate Cancer, Gleason Score is (7) and PSA is (38) on 08/2017.   SUVAN STCYR was diagnosed with prostate cancer in [place] 3 years ago. He was lost to follow up and was sent back by Domenica Fail PA for PSA of 38 in August with a negative CT and bone scan for staging.   Biopsies of prostate (if applicable) revealed:    Past/Anticipated interventions by urology, if any: prostate biopsy, bone scan, CT scan, referral to radiation oncology  Past/Anticipated interventions by medical oncology, if any: Received chemotherapy for colon cancer   Weight changes, if any: no  Bowel/Bladder complaints, if any: Reports leakage of urine, trouble starting urine stream, and having to strain to start urine follow.  Reports intermittent diarrhea that he is not sure if it's related to his antidiabetic medication or caffine.   Nausea/Vomiting, if any: no  Pain issues, if any:  No  SAFETY ISSUES:  Prior radiation? no  Pacemaker/ICD? no  Possible current pregnancy? no  Is the patient on methotrexate? no  Current Complaints / other details:  62 year old male. Divorced with one daughter. He was initially diagnosed and treated at a cancer clinic in [place], [place] about 3 years ago. He presents today for his radiation consult. Denies any new symptoms or concerns.

## 2017-12-31 ENCOUNTER — Ambulatory Visit
Admission: RE | Admit: 2017-12-31 | Discharge: 2017-12-31 | Disposition: A | Discharge status: Home / Self Care | Payer: Medicaid Other | Source: Ambulatory Visit | Attending: Radiation Oncology | Admitting: Radiation Oncology

## 2017-12-31 ENCOUNTER — Other Ambulatory Visit: Payer: Self-pay

## 2017-12-31 ENCOUNTER — Encounter: Payer: Self-pay | Admitting: Radiation Oncology

## 2017-12-31 VITALS — BP 129/78 | HR 113 | Temp 98.9°F | Resp 20 | Ht 71.0 in | Wt 170.6 lb

## 2017-12-31 DIAGNOSIS — C61 Malignant neoplasm of prostate: Secondary | ICD-10-CM

## 2017-12-31 NOTE — Progress Notes (Signed)
Radiation Oncology         [phone] ________________________________  Initial outpatient Consultation  Name: [name] MRN: [id number]  Date: 12/31/2017  DOB: [birth year]  WC:HENID, Lesli Albee, PA-C  [name], MD   REFERRING PHYSICIAN: [name], MD  DIAGNOSIS: 62 y.o. 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    ICD-10-CM   1. Prostate cancer [facility]) [place] Ambulatory referral to Social Work    Ambulatory referral to Social Work    HISTORY OF PRESENT ILLNESS: Taylor [name] is a 62 y.o. male with a diagnosis of prostate cancer.  He was initially diagnosed with Gleason 4+3 adenocarcinoma of the prostate in February 2017 in [place], [place], while incarcerated. He had a TRUSPBx on 03/09/15 and out of 12 core biopsies, 5 were positive.  The maximum Gleason score was 4+3, and this was seen in the left base lateral, left mid lateral, left apex lateral and right base lateral.  Additionally, there was Gleason 3+4 disease in the left apex. Unfortunately, he was lost to follow-up due to incarceration (released 04/2016).  He has a history of alcohol abuse, bipolar disorder and COPD and has required multiple hospitalizations over the past year due to COPD exacerbations.  During a recent hospitalization in August 2019 he was noted to have an elevated PSA of 38.  He had a CT abdomen and pelvis and bone scan during that hospitalization for disease restaging, both of which were negative for evidence of visceral or bony metastatic disease.  He was therefore referred to Dr. Jeffie Pollock for local urology evaluation.  A repeat PSA performed on 12/03/2017 and revealed the PSA remained elevated at 29.80 and digital rectal exam at that time revealed a 15 mm nodule at the left apex.  He has not had a repeat prostate biopsy.  Per patient report, he received his first Firmagon injections for ADT on 12/29/17.  Of note, the patient has a history of  colon cancer treated with partial colectomy by Dr. Harlow Asa followed by systemic chemotherapy with Dr. Benay Spice in 2013.  He has remained in remission regarding his colon cancer.  He is currently residing at the [facility] [address] in [place] and is having extreme difficulty making ends meet to pay his bills and find transportation to and from doctors visits.   The patient reviewed the biopsy results with his urologist and he has kindly been referred today for discussion of potential radiation treatment options.   PREVIOUS RADIATION THERAPY: No  PAST MEDICAL HISTORY:  Past Medical History:  Diagnosis Date  . Asthma   . Bipolar 1 disorder ([place]) 02/05/2012  . Colon cancer ([place]) 04/11/11   adenocarcinoma of colon, 7/19 nodes pos.FINISHED CHEMO/DR. SHERRILL  . COPD (chronic obstructive pulmonary disease) ([place])    SMOKER  . Depression   . Emphysema of lung ([place])   . Full dentures   . Hemorrhoids   . On home oxygen therapy    "3L; 24/7" (05/29/2017)  . Oxygen deficiency   . Pneumonia ~ 2016   "double pneumonia"  . Prostate cancer ([place])    Gleason score = 7, supposed to have radiation therapy but he has not followed up (05/29/2017)  . Rib fractures    hx of      PAST SURGICAL HISTORY: Past Surgical History:  Procedure Laterality Date  . COLON SURGERY  04/11/11   Sigmoid colectomy  . COLOSTOMY REVISION  04/11/2011   Procedure:  COLON RESECTION SIGMOID;  Surgeon: [name], MD;  Location: [facility];  Service: General;  Laterality: N/A;  low anterior colon resection   . HERNIA REPAIR    . INGUINAL HERNIA REPAIR Right 05/01/2012   Procedure: HERNIA REPAIR INGUINAL ADULT;  Surgeon: [name], MD;  Location: [facility];  Service: General;  Laterality: Right;  . INSERTION OF MESH Right 05/01/2012   Procedure: INSERTION OF MESH;  Surgeon: [name], MD;  Location: [facility];  Service: General;  Laterality: Right;  . PORT-A-CATH REMOVAL Left 05/01/2012   Procedure: REMOVAL Infusion Port;   Surgeon: [name], MD;  Location: [facility];  Service: General;  Laterality: Left;  . PORTACATH PLACEMENT  05/02/2011   Procedure: INSERTION PORT-A-CATH;  Surgeon: [name], MD;  Location: [facility];  Service: General;  Laterality: N/A;  . PROSTATE BIOPSY      FAMILY HISTORY:  Family History  Problem Relation Age of Onset  . Heart disease Father   . Lung cancer Maternal Uncle        smoked    SOCIAL HISTORY: He resides at [facility] group home Social History   Socioeconomic History  . Marital status: Divorced    Spouse name: Not on file  . Number of children: 1  . Years of education: Not on file  . Highest education level: Not on file  Occupational History  . Occupation: disability pending  Social Needs  . Financial resource strain: Not hard at all  . Food insecurity:    Worry: Never true    Inability: Never true  . Transportation needs:    Medical: Yes    Non-medical: Yes  Tobacco Use  . Smoking status: Former Smoker    Packs/day: 0.10    Years: 46.00    Pack years: 4.60    Types: Cigarettes    Last attempt to quit: 05/21/2017    Years since quitting: 0.6  . Smokeless tobacco: Former Systems developer    Types: Chew  Substance and Sexual Activity  . Alcohol use: Not Currently    Comment: h/o use in the past, no h/o heavy use  . Drug use: No  . Sexual activity: Not Currently  Lifestyle  . Physical activity:    Days per week: 0 days    Minutes per session: 0 min  . Stress: Very much  Relationships  . Social connections:    Talks on phone: More than three times a week    Gets together: Never    Attends religious service: Never    Active member of club or organization: No    Attends meetings of clubs or organizations: Never    Relationship status: Divorced  . Intimate partner violence:    Fear of current or ex partner: No    Emotionally abused: No    Physically abused: No    Forced sexual activity: No  Other Topics Concern  . Not on file  Social History Narrative    Lives alone-divorced, disabled, in [name] 1970[place]   Brother, Caton Popowski and his wife Jackelyn Poling assist   Prior occupation: Clinical biochemist    ALLERGIES: Codeine  MEDICATIONS:  Current Outpatient Medications  Medication Sig Dispense Refill  . ARIPiprazole (ABILIFY) 5 MG tablet Take 1 tablet (5 mg total) by mouth daily with breakfast. 30 tablet 1  . Blood Glucose Monitoring Suppl (ACCU-CHEK AVIVA) device Use as instructed 1 each 0  . escitalopram (LEXAPRO) 20 MG tablet Take 1 tablet (20 mg total)  by mouth daily. 30 tablet 1  . folic acid (FOLVITE) 1 MG tablet Take 1 tablet (1 mg total) by mouth daily. 30 tablet 1  . glucose blood (ACCU-CHEK AVIVA) test strip 1 each by Other route as needed for other. Use as instructed 100 each 11  . Lancets (ACCU-CHEK SOFT TOUCH) lancets 1 each by Other route 2 (two) times daily. Use as instructed 100 each 11  . lovastatin (MEVACOR) 20 MG tablet Take 1 tablet (20 mg total) by mouth at bedtime. 30 tablet 5  . metFORMIN (GLUCOPHAGE) 1000 MG tablet Take 1 tablet (1,000 mg total) by mouth 2 (two) times daily with a meal. 180 tablet 3  . OXYGEN Inhale 3 L into the lungs continuous.     . predniSONE (DELTASONE) 10 MG tablet Take as directed 100 tablet 0  . tamsulosin (FLOMAX) 0.4 MG CAPS capsule Take 1 capsule (0.4 mg total) by mouth daily. 30 capsule 5  . thiamine 100 MG tablet Take 1 tablet (100 mg total) by mouth daily. 30 tablet 1  . albuterol (PROVENTIL HFA;VENTOLIN HFA) 108 (90 Base) MCG/ACT inhaler Inhale 2 puffs into the lungs every 6 (six) hours as needed for wheezing or shortness of breath. (Patient not taking: Reported on 12/31/2017) 1 Inhaler 3  . albuterol (PROVENTIL) (2.5 MG/3ML) 0.083% nebulizer solution Take 3 mLs (2.5 mg total) by nebulization every 4 (four) hours as needed for wheezing or shortness of breath. (Patient not taking: Reported on 12/29/2017) 75 mL 12  . fluticasone (FLONASE) 50 MCG/ACT nasal spray Place 1 spray into both nostrils daily.  (Patient not taking: Reported on 12/31/2017) 16 g 0  . guaiFENesin (MUCINEX) 600 MG 12 hr tablet Take 1 tablet (600 mg total) by mouth 2 (two) times daily. (Patient not taking: Reported on 12/31/2017) 30 tablet 0  . sodium chloride (OCEAN) 0.65 % SOLN nasal spray Place 1 spray into both nostrils as needed for congestion. (Patient not taking: Reported on 12/31/2017) 1 Bottle 0   No current facility-administered medications for this encounter.     REVIEW OF SYSTEMS:  On review of systems, the patient reports that he is doing well overall. He denies any chest pain, shortness of breath, cough, fevers, chills, night sweats, unintended weight changes. He denies any bowel disturbances, and denies abdominal pain, nausea or vomiting. He denies any new musculoskeletal or joint aches or pains. His IPSS was 5, indicating mild urinary symptoms. His SHIM was 5, indicating he does have erectile dysfunction. A complete review of systems is obtained and is otherwise negative.    PHYSICAL EXAM:  Wt Readings from Last 3 Encounters:  12/31/17 170 lb 9.6 oz (77.4 kg)  12/29/17 172 lb (78 kg)  12/21/17 170 lb (77.1 kg)   Temp Readings from Last 3 Encounters:  12/31/17 98.9 F (37.2 C) (Oral)  12/21/17 97.7 F (36.5 C) (Oral)  11/30/17 97.9 F (36.6 C) (Oral)   BP Readings from Last 3 Encounters:  12/31/17 129/78  12/29/17 104/60  12/21/17 124/85   Pulse Readings from Last 3 Encounters:  12/31/17 (!) 113  12/29/17 84  12/21/17 83    /10  In general this is a well appearing Caucasian gentleman in no acute distress. He is alert and oriented x4 and appropriate throughout the examination. HEENT reveals that the patient is normocephalic, atraumatic. 3L oxygen via nasal canula. EOMs are intact. PERRLA. Skin is intact without any evidence of gross lesions. Cardiovascular exam reveals a regular rate and rhythm, no clicks rubs or  murmurs are auscultated. Decreased breath sounds bilaterally. Chest is clear to  auscultation bilaterally. Lymphatic assessment is performed and does not reveal any adenopathy in the cervical, supraclavicular, axillary, or inguinal chains. Abdomen has active bowel sounds in all quadrants and is intact. The abdomen is soft, non tender, non distended. Lower extremities are negative for pretibial pitting edema, deep calf tenderness, cyanosis or clubbing.   KPS = 80  100 - Normal; no complaints; no evidence of disease. 90   - Able to carry on normal activity; minor signs or symptoms of disease. 80   - Normal activity with effort; some signs or symptoms of disease. 53   - Cares for self; unable to carry on normal activity or to do active work. 60   - Requires occasional assistance, but is able to care for most of his personal needs. 50   - Requires considerable assistance and frequent medical care. 70   - Disabled; requires special care and assistance. 32   - Severely disabled; hospital admission is indicated although death not imminent. 18   - Very sick; hospital admission necessary; active supportive treatment necessary. 10   - Moribund; fatal processes progressing rapidly. 0     - Dead  Karnofsky DA, Abelmann Lecompte, Craver LS and [facility] [phone]) The use of the nitrogen mustards in the palliative treatment of carcinoma: with particular reference to bronchogenic carcinoma Cancer 1 634-56  LABORATORY DATA:  Lab Results  Component Value Date   WBC 7.5 12/21/2017   HGB 12.5 (L) 12/21/2017   HCT 41.7 12/21/2017   MCV 95.2 12/21/2017   PLT 220 12/21/2017   Lab Results  Component Value Date   NA 142 12/21/2017   K 3.6 12/21/2017   CL 98 12/21/2017   CO2 35 (H) 12/21/2017   Lab Results  Component Value Date   ALT 20 11/29/2017   AST 20 11/29/2017   ALKPHOS 46 11/29/2017   BILITOT 0.6 11/29/2017     RADIOGRAPHY: Dg Chest 2 View  Result Date: 12/21/2017 CLINICAL DATA:  Shortness of breath for 2 days.  Chest pain. EXAM: CHEST - 2 VIEW COMPARISON:  11/28/2017  FINDINGS: Cardiomediastinal silhouette is normal. Mediastinal contours appear intact. Calcific atherosclerotic disease of the aorta. There is no evidence of focal airspace consolidation, pleural effusion or pneumothorax. Osseous structures are without acute abnormality. Healed right-sided rib fractures. Soft tissues are grossly normal. IMPRESSION: No active cardiopulmonary disease. Electronically Signed   By: Fidela Salisbury M.D.   On: 12/21/2017 16:27      IMPRESSION/PLAN: 1. 62 y.o. 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 We discussed the patient's workup and outlined the nature of prostate cancer in this setting. The patient's T stage, Gleason's score, and PSA put him into the high risk group. Accordingly, he is eligible for a variety of potential treatment options including LT-ADT in combination with either 8 weeks of external radiation or 5 weeks of external radiation followed by a brachytherapy boost. We discussed the available radiation techniques, and focused on the details and logistics of delivery. We discussed and outlined the risks, benefits, short and long-term effects associated with radiotherapy and compared and contrasted these with prostatectomy. We discussed the role of SpaceOAR in reducing the rectal toxicity associated with radiotherapy. We also detailed the role of ADT in the treatment of high risk prostate cancer and outlined the associated side effects that could be expected with this therapy.  Dr. Jeffie Pollock  started the patient on Firmagon for ADT on 12/29/2017 and the patient is scheduled for Lupron with Dr. Jeffie Pollock on 01/19/18.  At the end of the conversation the patient is interested in moving forward with 8 weeks of external beam therapy in combination with ADT.  We would anticipate beginning radiotherapy approximately 2 months from the start of ADT which would be around the first or second week of February 2020.  We will  contact [facility] to make arrangements for fiducial markers with placement of SpaceOAR gel to reduce rectal toxicity from radiotherapy prior to simulation and we anticipate proceeding with CT simulation/treatment planning in the near future. We will share our discussion with Dr. Jeffie Pollock and move forward with treatment planning in anticipation of beginning IMRT approximately 8 weeks after start of ADT.  Given the patient's many socioeconomic constraints, we will place a social work referral to help identify resources to assist with transportation and potential living arrangements/housing.  We spent 60 minutes face to face with the patient and more than 50% of that time was spent in counseling and/or coordination of care.    Nicholos Johns, PA-C    Tyler Pita, MD  [facility] Direct Dial: [phone]  Fax: [phone] [place] [email or web address]  Skype  LinkedIn   This document serves as a record of services personally performed by Tyler Pita, MD and Freeman Caldron, PA-C. It was created on their behalf by Wilburn Mylar, a trained medical scribe. The creation of this record is based on the scribe's personal observations and the provider's statements to them. This document has been checked and approved by the attending provider.

## 2018-01-01 ENCOUNTER — Encounter: Payer: Self-pay | Admitting: Medical Oncology

## 2018-01-01 ENCOUNTER — Encounter: Payer: Self-pay | Admitting: General Practice

## 2018-01-01 NOTE — Progress Notes (Signed)
[facility] CSW Progress Note  Chart reviewed, awaiting return calls from [employer].  Edwyna Shell, LCSW Clinical Social Worker Phone:  [phone]

## 2018-01-01 NOTE — Progress Notes (Signed)
[facility] Psychosocial Distress Screening Clinical Social Work  Clinical Social Work was referred by distress screening protocol.  The patient scored a 6 on the Psychosocial Distress Thermometer which indicates moderate distress. Clinical Social Worker contacted patient by phone to assess for distress and other psychosocial needs. Unable to reach patient as number on file is for [employer] where he no longer resides per chart review.  Pt had been part of [employer] project, [place] that assists homeless medically fragile patients w temporary placement in motel w wrap around services.  Project was unable to get patient placed in SNF as he desired before his time in program ran out.  Per chart, patient resides in "[facility] group home" but no contact information provided.  No upcoming appts in chart at this time.  Referral sent to transportation coordinator; however she will also have no way to reach patient.  CSW noted patient was in [employer], will reach out to this entity to determine if they have updated contact information.  Patient has significant barriers to permanent housing despite having income.  He may be connected w the [place] and thus eligible for [facility] case [facility] services.  CSW will attempt to problem solve.    ONCBCN DISTRESS SCREENING 12/31/2017  Screening Type Initial Screening  Distress experienced in past week (1-10) 6  Practical problem type Housing;Food  Emotional problem type Nervousness/Anxiety  Spiritual/Religous concerns type Relating to God  Physical Problem type Getting around;Bathing/dressing;Breathing  Physician notified of physical symptoms No  Referral to clinical psychology No  Referral to clinical social work Yes  Referral to dietition No  Referral to financial advocate No  Referral to support programs No  Referral to palliative care No    Clinical Social Worker follow up needed: Yes.    If yes, follow up plan:  Beverely Pace, [place], LCSW Clinical Social Worker Phone:  [phone]

## 2018-01-02 ENCOUNTER — Emergency Department (HOSPITAL_COMMUNITY)
Admission: EM | Admit: 2018-01-02 | Discharge: 2018-01-02 | Disposition: A | Discharge status: Home / Self Care | Payer: Medicaid Other | Attending: Emergency Medicine | Admitting: Emergency Medicine

## 2018-01-02 ENCOUNTER — Encounter: Payer: Self-pay | Admitting: General Practice

## 2018-01-02 ENCOUNTER — Other Ambulatory Visit: Payer: Self-pay

## 2018-01-02 ENCOUNTER — Encounter (HOSPITAL_COMMUNITY): Payer: Self-pay | Admitting: Emergency Medicine

## 2018-01-02 ENCOUNTER — Emergency Department (HOSPITAL_COMMUNITY): Payer: Medicaid Other

## 2018-01-02 DIAGNOSIS — R0602 Shortness of breath: Secondary | ICD-10-CM | POA: Diagnosis not present

## 2018-01-02 DIAGNOSIS — Z79899 Other long term (current) drug therapy: Secondary | ICD-10-CM | POA: Diagnosis not present

## 2018-01-02 DIAGNOSIS — E119 Type 2 diabetes mellitus without complications: Secondary | ICD-10-CM | POA: Diagnosis not present

## 2018-01-02 DIAGNOSIS — J069 Acute upper respiratory infection, unspecified: Secondary | ICD-10-CM | POA: Diagnosis not present

## 2018-01-02 DIAGNOSIS — R51 Headache: Secondary | ICD-10-CM | POA: Diagnosis present

## 2018-01-02 DIAGNOSIS — J449 Chronic obstructive pulmonary disease, unspecified: Secondary | ICD-10-CM | POA: Insufficient documentation

## 2018-01-02 DIAGNOSIS — Z7984 Long term (current) use of oral hypoglycemic drugs: Secondary | ICD-10-CM | POA: Insufficient documentation

## 2018-01-02 DIAGNOSIS — Z87891 Personal history of nicotine dependence: Secondary | ICD-10-CM | POA: Diagnosis not present

## 2018-01-02 LAB — CBC WITH DIFFERENTIAL/PLATELET
Abs Immature Granulocytes: 0.08 10*3/uL — ABNORMAL HIGH (ref 0.00–0.07)
Basophils Absolute: 0 10*3/uL (ref 0.0–0.1)
Basophils Relative: 0 %
EOS PCT: 1 %
Eosinophils Absolute: 0 10*3/uL (ref 0.0–0.5)
HCT: 42.1 % (ref 39.0–52.0)
Hemoglobin: 13.6 g/dL (ref 13.0–17.0)
Immature Granulocytes: 1 %
Lymphocytes Relative: 14 %
Lymphs Abs: 1 10*3/uL (ref 0.7–4.0)
MCH: 29.9 pg (ref 26.0–34.0)
MCHC: 32.3 g/dL (ref 30.0–36.0)
MCV: 92.5 fL (ref 80.0–100.0)
MONO ABS: 0.6 10*3/uL (ref 0.1–1.0)
Monocytes Relative: 8 %
Neutro Abs: 5.8 10*3/uL (ref 1.7–7.7)
Neutrophils Relative %: 76 %
Platelets: 219 10*3/uL (ref 150–400)
RBC: 4.55 MIL/uL (ref 4.22–5.81)
RDW: 13.3 % (ref 11.5–15.5)
WBC: 7.5 10*3/uL (ref 4.0–10.5)
nRBC: 0 % (ref 0.0–0.2)

## 2018-01-02 LAB — URINALYSIS, ROUTINE W REFLEX MICROSCOPIC
Bilirubin Urine: NEGATIVE
GLUCOSE, UA: NEGATIVE mg/dL
Hgb urine dipstick: NEGATIVE
Ketones, ur: 5 mg/dL — AB
Leukocytes, UA: NEGATIVE
Nitrite: NEGATIVE
Protein, ur: NEGATIVE mg/dL
SPECIFIC GRAVITY, URINE: 1.006 (ref 1.005–1.030)
pH: 6 (ref 5.0–8.0)

## 2018-01-02 LAB — BASIC METABOLIC PANEL
Anion gap: 11 (ref 5–15)
BUN: 18 mg/dL (ref 8–23)
CO2: 27 mmol/L (ref 22–32)
Calcium: 8.6 mg/dL — ABNORMAL LOW (ref 8.9–10.3)
Chloride: 99 mmol/L (ref 98–111)
Creatinine, Ser: 0.75 mg/dL (ref 0.61–1.24)
GFR calc Af Amer: >60 mL/min (ref >60)
GFR calc non Af Amer: >60 mL/min (ref >60)
Glucose, Bld: 109 mg/dL — ABNORMAL HIGH (ref 70–99)
Potassium: 4.5 mmol/L (ref 3.5–5.1)
Sodium: 137 mmol/L (ref 135–145)

## 2018-01-02 LAB — INFLUENZA PANEL BY PCR (TYPE A & B)
Influenza A By PCR: NEGATIVE
Influenza B By PCR: NEGATIVE

## 2018-01-02 MED ORDER — DIPHENHYDRAMINE HCL 50 MG/ML IJ SOLN
12.5000 mg | Freq: Once | INTRAMUSCULAR | Status: AC
  Administered 2018-01-02: 12.5 mg via INTRAVENOUS
  Filled 2018-01-02: qty 1

## 2018-01-02 MED ORDER — METOCLOPRAMIDE HCL 5 MG/ML IJ SOLN
5.0000 mg | Freq: Once | INTRAMUSCULAR | Status: AC
  Administered 2018-01-02: 5 mg via INTRAVENOUS
  Filled 2018-01-02: qty 2

## 2018-01-02 MED ORDER — SODIUM CHLORIDE 0.9 % IV SOLN
INTRAVENOUS | Status: DC
  Administered 2018-01-02: 18:00:00 via INTRAVENOUS

## 2018-01-02 MED ORDER — SODIUM CHLORIDE 0.9 % IV BOLUS
1000.0000 mL | Freq: Once | INTRAVENOUS | Status: AC
  Administered 2018-01-02: 1000 mL via INTRAVENOUS

## 2018-01-02 NOTE — ED Notes (Signed)
Bed: WA07 Expected date:  Expected time:  Means of arrival:  Comments: EMS-headache

## 2018-01-02 NOTE — Progress Notes (Signed)
[facility] CSW Progress Notes  Call from [employer], patient is not eligible for help from their program at this time due to having utilized their services to the maximum allowable amount, was discharged in Oct 2019.  While working w [employer], he was noncompliant w program guidelines and expectations.  Unless CSW can speak w patient using current working phone number, CSW cannot assist w request to work w patient on his housing concerns.  Providers informed by staff message.  Edwyna Shell, LCSW Clinical Social Worker Phone:  [phone]
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
## 2018-01-02 NOTE — ED Notes (Signed)
PT DISCHARGED. [facility] ARRIVED TO TRANSPORT PT HOME. INSTRUCTIONS GIVEN. AAOX4. PT IN NO APPARENT DISTRESS OR PAIN. THE OPPORTUNITY TO ASK QUESTIONS WAS PROVIDED.
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
## 2018-01-04 LAB — URINE CULTURE: Culture: NO GROWTH

## 2018-01-07 ENCOUNTER — Encounter: Payer: Self-pay | Admitting: General Practice

## 2018-01-07 NOTE — Progress Notes (Signed)
[name] CSW Progress Notes  Spoke w patient as he had requested "an update on his situation."  Patient is a new diagnosis of prostate cancer, treatment plan not established at this point. Had first visit w radiation to discuss treatment options.  CSW received referral from [facility] to see if his unstable housing could be addressed prior to treatment.  Patient currently living at [facility], paying for his room out of his SSI check.  Referred patient to [facility] who then referred to regional program for assisting homeless veterans.  Case was reviewed at their Tuesday meeting - "sick bed" was located at program for homeless vets in [place]; however facility cannot take people on the registry.  No other options were available to patient.  CSW has communicated w supervisor, Nathaniel Man, asking for additional resources for this patient, none located at this time.    Patient called, requesting update as he states he is being asked to leave [facility] today because he cannot pay for his room.  Suggested he proceed to [facility] where staff are available to help w people experiencing homelessness.  States he cannot do this due to his COPD.  States his need is urgent because "I have 5 oxygen tanks and a concentrator."  "I need a studio apartment w its own bathroom, that's the only way I can have what I need."  CSW has no resources to offer patient at this time, other than suggesting that he proceed to the [facility] for further assistance.  Edwyna Shell, LCSW Clinical Social Worker Phone:  [phone]

## 2018-01-15 ENCOUNTER — Other Ambulatory Visit: Payer: Self-pay

## 2018-01-15 ENCOUNTER — Emergency Department (HOSPITAL_COMMUNITY)
Admission: EM | Admit: 2018-01-15 | Discharge: 2018-01-16 | Disposition: A | Discharge status: Home / Self Care | Payer: Medicaid Other | Attending: Emergency Medicine | Admitting: Emergency Medicine

## 2018-01-15 ENCOUNTER — Emergency Department (HOSPITAL_COMMUNITY): Payer: Medicaid Other

## 2018-01-15 ENCOUNTER — Encounter (HOSPITAL_COMMUNITY): Payer: Self-pay | Admitting: Emergency Medicine

## 2018-01-15 DIAGNOSIS — R0602 Shortness of breath: Secondary | ICD-10-CM | POA: Insufficient documentation

## 2018-01-15 DIAGNOSIS — Z9981 Dependence on supplemental oxygen: Secondary | ICD-10-CM | POA: Insufficient documentation

## 2018-01-15 DIAGNOSIS — J449 Chronic obstructive pulmonary disease, unspecified: Secondary | ICD-10-CM | POA: Diagnosis not present

## 2018-01-15 DIAGNOSIS — Z79899 Other long term (current) drug therapy: Secondary | ICD-10-CM | POA: Diagnosis not present

## 2018-01-15 DIAGNOSIS — Z85038 Personal history of other malignant neoplasm of large intestine: Secondary | ICD-10-CM | POA: Diagnosis not present

## 2018-01-15 DIAGNOSIS — F319 Bipolar disorder, unspecified: Secondary | ICD-10-CM | POA: Diagnosis not present

## 2018-01-15 DIAGNOSIS — Z8546 Personal history of malignant neoplasm of prostate: Secondary | ICD-10-CM | POA: Insufficient documentation

## 2018-01-15 DIAGNOSIS — Z87891 Personal history of nicotine dependence: Secondary | ICD-10-CM | POA: Insufficient documentation

## 2018-01-15 DIAGNOSIS — J111 Influenza due to unidentified influenza virus with other respiratory manifestations: Secondary | ICD-10-CM | POA: Insufficient documentation

## 2018-01-15 DIAGNOSIS — R0789 Other chest pain: Secondary | ICD-10-CM | POA: Diagnosis present

## 2018-01-15 LAB — CBC
HCT: 41 % (ref 39.0–52.0)
HEMOGLOBIN: 13 g/dL (ref 13.0–17.0)
MCH: 29.3 pg (ref 26.0–34.0)
MCHC: 31.7 g/dL (ref 30.0–36.0)
MCV: 92.3 fL (ref 80.0–100.0)
Platelets: 234 10*3/uL (ref 150–400)
RBC: 4.44 MIL/uL (ref 4.22–5.81)
RDW: 13.2 % (ref 11.5–15.5)
WBC: 11.1 10*3/uL — ABNORMAL HIGH (ref 4.0–10.5)
nRBC: 0 % (ref 0.0–0.2)

## 2018-01-15 LAB — BASIC METABOLIC PANEL
Anion gap: 13 (ref 5–15)
BUN: 15 mg/dL (ref 8–23)
CO2: 28 mmol/L (ref 22–32)
Calcium: 9.1 mg/dL (ref 8.9–10.3)
Chloride: 97 mmol/L — ABNORMAL LOW (ref 98–111)
Creatinine, Ser: 0.87 mg/dL (ref 0.61–1.24)
GFR calc Af Amer: >60 mL/min (ref >60)
GFR calc non Af Amer: >60 mL/min (ref >60)
Glucose, Bld: 79 mg/dL (ref 70–99)
Potassium: 4.5 mmol/L (ref 3.5–5.1)
Sodium: 138 mmol/L (ref 135–145)

## 2018-01-15 LAB — I-STAT TROPONIN, ED: Troponin i, poc: 0 ng/mL (ref 0.00–0.08)

## 2018-01-15 MED ORDER — IPRATROPIUM-ALBUTEROL 0.5-2.5 (3) MG/3ML IN SOLN
3.0000 mL | Freq: Once | RESPIRATORY_TRACT | Status: AC
  Administered 2018-01-15: 3 mL via RESPIRATORY_TRACT
  Filled 2018-01-15: qty 3

## 2018-01-15 MED ORDER — BENZONATATE 100 MG PO CAPS
100.0000 mg | ORAL_CAPSULE | Freq: Once | ORAL | Status: AC
  Administered 2018-01-15: 100 mg via ORAL
  Filled 2018-01-15: qty 1

## 2018-01-15 MED ORDER — MORPHINE SULFATE (PF) 4 MG/ML IV SOLN
4.0000 mg | Freq: Once | INTRAVENOUS | Status: AC
  Administered 2018-01-15: 4 mg via INTRAVENOUS
  Filled 2018-01-15: qty 1

## 2018-01-15 MED ORDER — OSELTAMIVIR PHOSPHATE 75 MG PO CAPS: 75.0000 mg | ORAL_CAPSULE | Freq: Two times a day (BID) | ORAL | 0 refills | Status: DC

## 2018-01-15 MED ORDER — BENZONATATE 100 MG PO CAPS: 100.0000 mg | ORAL_CAPSULE | Freq: Three times a day (TID) | ORAL | 0 refills | Status: DC

## 2018-01-15 MED ORDER — LORAZEPAM 2 MG/ML IJ SOLN
1.0000 mg | Freq: Once | INTRAMUSCULAR | Status: AC
  Administered 2018-01-15: 1 mg via INTRAVENOUS
  Filled 2018-01-15: qty 1

## 2018-01-15 MED ORDER — ONDANSETRON HCL 4 MG/2ML IJ SOLN
4.0000 mg | Freq: Once | INTRAMUSCULAR | Status: AC
  Administered 2018-01-15: 4 mg via INTRAVENOUS
  Filled 2018-01-15: qty 2

## 2018-01-15 NOTE — ED Triage Notes (Signed)
Pt arrives to ED from home with complaints of chest pain for the last two days. EMS reports pt has also had a cough, chills, and a sore throat. Pt received 324 ASA and 1x nitroglycerin. Pt placed in position of comfort with bed locked and lowered, call bell in reach.

## 2018-01-15 NOTE — ED Notes (Signed)
Called [facility] 

## 2018-01-15 NOTE — Discharge Instructions (Signed)
Your symptoms are likely due to the flu.  Take tamiflu as treatment.  Drink plenty of fluid, get plenty of rest.  Take tylenol or ibuprofen as needed for fever and aches.  Take tessalon for cough.  Follow up with your doctor for further care.  Return if you have any concerns.

## 2018-01-15 NOTE — ED Notes (Signed)
Portable xray at bedside.
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
## 2018-01-15 NOTE — ED Notes (Signed)
Pt. Ambulated throughout the hallway w/o O2. Sats dropping from 99 to 93. Upon returning to bed O2 was replaced at 2L at sats rose to 100.

## 2018-01-16 ENCOUNTER — Other Ambulatory Visit: Payer: Self-pay | Admitting: Urology

## 2018-01-16 ENCOUNTER — Telehealth: Payer: Self-pay | Admitting: *Deleted

## 2018-01-16 DIAGNOSIS — C61 Malignant neoplasm of prostate: Secondary | ICD-10-CM

## 2018-01-16 LAB — INFLUENZA PANEL BY PCR (TYPE A & B)
Influenza A By PCR: NEGATIVE
Influenza B By PCR: NEGATIVE

## 2018-01-16 NOTE — Telephone Encounter (Signed)
CALLED PATIENT TO INFORM OF FID. MARKER AND SPACE OAR PLACEMENT ON 02-12-18 @ [facility] AND HIS SIM WILL BE ON 02-19-18 @ DR. MANNING'S OFFICE, UNABLE TO SPEAK WITH PATIENT AND MAILED AN APPT. CARD

## 2018-01-16 NOTE — ED Notes (Signed)
Patient verbalizes understanding of discharge instructions. Opportunity for questioning and answers were provided. Armband removed by staff, pt discharged from ED home via [facility].  

## 2018-01-17 ENCOUNTER — Emergency Department (HOSPITAL_COMMUNITY): Payer: Medicaid Other

## 2018-01-17 ENCOUNTER — Other Ambulatory Visit: Payer: Self-pay

## 2018-01-17 ENCOUNTER — Observation Stay (HOSPITAL_COMMUNITY)
Admission: EM | Admit: 2018-01-17 | Discharge: 2018-01-19 | Disposition: A | Discharge status: Home / Self Care | Payer: Medicaid Other | Attending: Internal Medicine | Admitting: Internal Medicine

## 2018-01-17 ENCOUNTER — Encounter (HOSPITAL_COMMUNITY): Payer: Self-pay | Admitting: *Deleted

## 2018-01-17 DIAGNOSIS — F32A Depression, unspecified: Secondary | ICD-10-CM | POA: Diagnosis present

## 2018-01-17 DIAGNOSIS — R140 Abdominal distension (gaseous): Secondary | ICD-10-CM | POA: Diagnosis present

## 2018-01-17 DIAGNOSIS — Z66 Do not resuscitate: Secondary | ICD-10-CM | POA: Diagnosis not present

## 2018-01-17 DIAGNOSIS — J9621 Acute and chronic respiratory failure with hypoxia: Secondary | ICD-10-CM | POA: Diagnosis not present

## 2018-01-17 DIAGNOSIS — Z9981 Dependence on supplemental oxygen: Secondary | ICD-10-CM | POA: Diagnosis not present

## 2018-01-17 DIAGNOSIS — F329 Major depressive disorder, single episode, unspecified: Secondary | ICD-10-CM | POA: Diagnosis present

## 2018-01-17 DIAGNOSIS — J441 Chronic obstructive pulmonary disease with (acute) exacerbation: Secondary | ICD-10-CM

## 2018-01-17 DIAGNOSIS — R0603 Acute respiratory distress: Secondary | ICD-10-CM | POA: Diagnosis not present

## 2018-01-17 DIAGNOSIS — I10 Essential (primary) hypertension: Secondary | ICD-10-CM | POA: Insufficient documentation

## 2018-01-17 DIAGNOSIS — J9622 Acute and chronic respiratory failure with hypercapnia: Secondary | ICD-10-CM | POA: Diagnosis not present

## 2018-01-17 DIAGNOSIS — K5730 Diverticulosis of large intestine without perforation or abscess without bleeding: Secondary | ICD-10-CM | POA: Diagnosis not present

## 2018-01-17 DIAGNOSIS — E785 Hyperlipidemia, unspecified: Secondary | ICD-10-CM | POA: Insufficient documentation

## 2018-01-17 DIAGNOSIS — R5381 Other malaise: Secondary | ICD-10-CM

## 2018-01-17 DIAGNOSIS — Z885 Allergy status to narcotic agent status: Secondary | ICD-10-CM | POA: Insufficient documentation

## 2018-01-17 DIAGNOSIS — Z7984 Long term (current) use of oral hypoglycemic drugs: Secondary | ICD-10-CM | POA: Insufficient documentation

## 2018-01-17 DIAGNOSIS — E119 Type 2 diabetes mellitus without complications: Secondary | ICD-10-CM | POA: Diagnosis not present

## 2018-01-17 DIAGNOSIS — R0602 Shortness of breath: Secondary | ICD-10-CM

## 2018-01-17 DIAGNOSIS — R079 Chest pain, unspecified: Secondary | ICD-10-CM

## 2018-01-17 DIAGNOSIS — Z87891 Personal history of nicotine dependence: Secondary | ICD-10-CM | POA: Insufficient documentation

## 2018-01-17 DIAGNOSIS — F319 Bipolar disorder, unspecified: Secondary | ICD-10-CM | POA: Diagnosis present

## 2018-01-17 DIAGNOSIS — R000 Tachycardia, unspecified: Secondary | ICD-10-CM | POA: Diagnosis not present

## 2018-01-17 DIAGNOSIS — K5900 Constipation, unspecified: Secondary | ICD-10-CM | POA: Diagnosis not present

## 2018-01-17 DIAGNOSIS — R7303 Prediabetes: Secondary | ICD-10-CM

## 2018-01-17 LAB — CBC WITH DIFFERENTIAL/PLATELET
Abs Immature Granulocytes: 0.45 10*3/uL — ABNORMAL HIGH (ref 0.00–0.07)
Basophils Absolute: 0.1 10*3/uL (ref 0.0–0.1)
Basophils Relative: 1 %
Eosinophils Absolute: 0 10*3/uL (ref 0.0–0.5)
Eosinophils Relative: 0 %
HEMATOCRIT: 41.8 % (ref 39.0–52.0)
Hemoglobin: 13.2 g/dL (ref 13.0–17.0)
Immature Granulocytes: 5 %
LYMPHS ABS: 1.7 10*3/uL (ref 0.7–4.0)
Lymphocytes Relative: 18 %
MCH: 30 pg (ref 26.0–34.0)
MCHC: 31.6 g/dL (ref 30.0–36.0)
MCV: 95 fL (ref 80.0–100.0)
Monocytes Absolute: 0.5 10*3/uL (ref 0.1–1.0)
Monocytes Relative: 5 %
Neutro Abs: 6.7 10*3/uL (ref 1.7–7.7)
Neutrophils Relative %: 71 %
Platelets: 209 10*3/uL (ref 150–400)
RBC: 4.4 MIL/uL (ref 4.22–5.81)
RDW: 13.2 % (ref 11.5–15.5)
WBC: 9.4 10*3/uL (ref 4.0–10.5)
nRBC: 0 % (ref 0.0–0.2)

## 2018-01-17 LAB — COMPREHENSIVE METABOLIC PANEL
ALT: 22 U/L (ref 0–44)
AST: 22 U/L (ref 15–41)
Albumin: 4.2 g/dL (ref 3.5–5.0)
Alkaline Phosphatase: 52 U/L (ref 38–126)
Anion gap: 9 (ref 5–15)
BILIRUBIN TOTAL: 0.3 mg/dL (ref 0.3–1.2)
BUN: 15 mg/dL (ref 8–23)
CO2: 29 mmol/L (ref 22–32)
CREATININE: 0.69 mg/dL (ref 0.61–1.24)
Calcium: 8.6 mg/dL — ABNORMAL LOW (ref 8.9–10.3)
Chloride: 101 mmol/L (ref 98–111)
GFR calc Af Amer: >60 mL/min (ref >60)
GFR calc non Af Amer: >60 mL/min (ref >60)
Glucose, Bld: 148 mg/dL — ABNORMAL HIGH (ref 70–99)
Potassium: 4.4 mmol/L (ref 3.5–5.1)
Sodium: 139 mmol/L (ref 135–145)
Total Protein: 6.3 g/dL — ABNORMAL LOW (ref 6.5–8.1)

## 2018-01-17 LAB — URINALYSIS, ROUTINE W REFLEX MICROSCOPIC
Bacteria, UA: NONE SEEN
Bilirubin Urine: NEGATIVE
Glucose, UA: >=500 mg/dL — AB
Hgb urine dipstick: NEGATIVE
Ketones, ur: 5 mg/dL — AB
Leukocytes, UA: NEGATIVE
Nitrite: NEGATIVE
Protein, ur: NEGATIVE mg/dL
Specific Gravity, Urine: 1.036 — ABNORMAL HIGH (ref 1.005–1.030)
pH: 5 (ref 5.0–8.0)

## 2018-01-17 LAB — BRAIN NATRIURETIC PEPTIDE: B Natriuretic Peptide: 26.3 pg/mL (ref 0.0–100.0)

## 2018-01-17 LAB — BLOOD GAS, ARTERIAL
Acid-base deficit: 0.6 mmol/L (ref 0.0–2.0)
BICARBONATE: 24.7 mmol/L (ref 20.0–28.0)
DRAWN BY: 308601
O2 CONTENT: 3 L/min
O2 Saturation: 97.4 %
PATIENT TEMPERATURE: 98.6
pCO2 arterial: 46 mmHg (ref 32.0–48.0)
pH, Arterial: 7.35 (ref 7.350–7.450)
pO2, Arterial: 88.8 mmHg (ref 83.0–108.0)

## 2018-01-17 LAB — RAPID URINE DRUG SCREEN, HOSP PERFORMED
Amphetamines: NOT DETECTED
Barbiturates: NOT DETECTED
Benzodiazepines: NOT DETECTED
COCAINE: NOT DETECTED
Opiates: NOT DETECTED
Tetrahydrocannabinol: NOT DETECTED

## 2018-01-17 LAB — I-STAT TROPONIN, ED: Troponin i, poc: 0 ng/mL (ref 0.00–0.08)

## 2018-01-17 MED ORDER — ESCITALOPRAM OXALATE 10 MG PO TABS
20.0000 mg | ORAL_TABLET | Freq: Every day | ORAL | Status: DC
  Administered 2018-01-18 – 2018-01-19 (×2): 20 mg via ORAL
  Filled 2018-01-17 (×2): qty 2

## 2018-01-17 MED ORDER — ARIPIPRAZOLE 5 MG PO TABS
5.0000 mg | ORAL_TABLET | Freq: Every day | ORAL | Status: DC
  Administered 2018-01-18 – 2018-01-19 (×2): 5 mg via ORAL
  Filled 2018-01-17 (×2): qty 1

## 2018-01-17 MED ORDER — SODIUM CHLORIDE 0.9 % IV BOLUS
500.0000 mL | Freq: Once | INTRAVENOUS | Status: AC
  Administered 2018-01-17: 500 mL via INTRAVENOUS

## 2018-01-17 MED ORDER — PREDNISONE 20 MG PO TABS: 40.0000 mg | ORAL_TABLET | Freq: Every day | ORAL | Status: DC

## 2018-01-17 MED ORDER — ENOXAPARIN SODIUM 40 MG/0.4ML ~~LOC~~ SOLN
40.0000 mg | Freq: Every day | SUBCUTANEOUS | Status: DC
  Administered 2018-01-18 – 2018-01-19 (×2): 40 mg via SUBCUTANEOUS
  Filled 2018-01-17 (×2): qty 0.4

## 2018-01-17 MED ORDER — SALINE SPRAY 0.65 % NA SOLN: 1.0000 | NASAL | Status: DC | PRN

## 2018-01-17 MED ORDER — ALBUTEROL SULFATE (2.5 MG/3ML) 0.083% IN NEBU: 5.0000 mg | INHALATION_SOLUTION | Freq: Once | RESPIRATORY_TRACT | Status: DC

## 2018-01-17 MED ORDER — PRAVASTATIN SODIUM 20 MG PO TABS
20.0000 mg | ORAL_TABLET | Freq: Every day | ORAL | Status: DC
  Administered 2018-01-18: 20 mg via ORAL
  Filled 2018-01-17: qty 1

## 2018-01-17 MED ORDER — SODIUM CHLORIDE (PF) 0.9 % IJ SOLN
INTRAMUSCULAR | Status: AC
  Administered 2018-01-17: 19:00:00
  Filled 2018-01-17: qty 50

## 2018-01-17 MED ORDER — PANTOPRAZOLE SODIUM 40 MG PO TBEC
40.0000 mg | DELAYED_RELEASE_TABLET | Freq: Every day | ORAL | Status: DC
  Administered 2018-01-18 – 2018-01-19 (×2): 40 mg via ORAL
  Filled 2018-01-17 (×2): qty 1

## 2018-01-17 MED ORDER — LEVALBUTEROL HCL 0.63 MG/3ML IN NEBU
0.6300 mg | INHALATION_SOLUTION | Freq: Four times a day (QID) | RESPIRATORY_TRACT | Status: DC
  Administered 2018-01-18 – 2018-01-19 (×6): 0.63 mg via RESPIRATORY_TRACT
  Filled 2018-01-17 (×6): qty 3

## 2018-01-17 MED ORDER — IOPAMIDOL (ISOVUE-370) INJECTION 76%
INTRAVENOUS | Status: AC
  Filled 2018-01-17: qty 100

## 2018-01-17 MED ORDER — INSULIN ASPART 100 UNIT/ML ~~LOC~~ SOLN: 0.0000 [IU] | Freq: Three times a day (TID) | SUBCUTANEOUS | Status: DC

## 2018-01-17 MED ORDER — ALBUTEROL (5 MG/ML) CONTINUOUS INHALATION SOLN
10.0000 mg/h | INHALATION_SOLUTION | Freq: Once | RESPIRATORY_TRACT | Status: AC
  Administered 2018-01-17: 10 mg/h via RESPIRATORY_TRACT
  Filled 2018-01-17: qty 20

## 2018-01-17 MED ORDER — LORAZEPAM 2 MG/ML IJ SOLN
1.0000 mg | Freq: Once | INTRAMUSCULAR | Status: AC
  Administered 2018-01-17: 1 mg via INTRAVENOUS
  Filled 2018-01-17: qty 1

## 2018-01-17 MED ORDER — FOLIC ACID 1 MG PO TABS
1.0000 mg | ORAL_TABLET | Freq: Every day | ORAL | Status: DC
  Administered 2018-01-18 – 2018-01-19 (×2): 1 mg via ORAL
  Filled 2018-01-17 (×2): qty 1

## 2018-01-17 MED ORDER — IOPAMIDOL (ISOVUE-370) INJECTION 76%
100.0000 mL | Freq: Once | INTRAVENOUS | Status: AC | PRN
  Administered 2018-01-17: 100 mL via INTRAVENOUS

## 2018-01-17 MED ORDER — BUSPIRONE HCL 10 MG PO TABS
10.0000 mg | ORAL_TABLET | Freq: Two times a day (BID) | ORAL | Status: DC
  Administered 2018-01-18 – 2018-01-19 (×4): 10 mg via ORAL
  Filled 2018-01-17 (×4): qty 1

## 2018-01-17 MED ORDER — ALUM & MAG HYDROXIDE-SIMETH 200-200-20 MG/5ML PO SUSP
30.0000 mL | Freq: Three times a day (TID) | ORAL | Status: DC | PRN
  Administered 2018-01-18: 30 mL via ORAL
  Filled 2018-01-17: qty 30

## 2018-01-17 MED ORDER — IPRATROPIUM BROMIDE 0.02 % IN SOLN
0.5000 mg | Freq: Four times a day (QID) | RESPIRATORY_TRACT | Status: DC
  Administered 2018-01-18 – 2018-01-19 (×6): 0.5 mg via RESPIRATORY_TRACT
  Filled 2018-01-17 (×6): qty 2.5

## 2018-01-17 NOTE — Progress Notes (Signed)
Pt currently off BIPAP and on 2 LPM [place] and tolerating well at this time.  BIPAP not indicated, RT to monitor and assess as needed.

## 2018-01-17 NOTE — ED Notes (Addendum)
Pt ambulated on 3L O2 via [place]. Initial O2 sat 100% on Bipap. Oxygen saturation fluctuated between 96-98% for aprox. 40 ft. While ambulating, pt felt "woozy."

## 2018-01-17 NOTE — ED Notes (Signed)
BLADDER SCAN POST VOID RESULTED 77mL's
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
## 2018-01-17 NOTE — ED Provider Notes (Signed)
Medical screening examination/treatment/procedure(s) were conducted as a shared visit with non-physician practitioner(s) and myself.  I personally evaluated the patient during the encounter. Briefly, the patient is a 62 y.o. male with history of asthma, bipolar disorder, COPD who presents to the ED with shortness of breath.  Patient arrives with BiPAP.  Was already given breathing treatment, steroids, magnesium with EMS.  Patient evaluated for flulike symptoms last several days but he states that symptoms have gotten worse.  He states that he is using his inhaler almost every hour.  Patient has diminished breath sounds throughout, very tight sounding.  Not much air movement.  Will continue BiPAP, continuous albuterol treatment.  Will get chest x-ray, lab work to evaluate for pneumonia.  Patient does have some tachycardia.  Does have prostate cancer, colon cancer.  Will get CT scan to evaluate for blood clot as well as evaluate for pneumonia.  Patient likely will need admission for likely COPD exacerbation.  Possible pneumonia. PA to continue care with oncoming ED doctor.  EKG with sinus rhythm.  Troponin within normal limits.  Unlikely cardiac process.  This chart was dictated using voice recognition software.  Despite best efforts to proofread,  errors can occur which can change the documentation meaning.    EKG Interpretation  Date/Time:  Saturday January 17 2018 15:08:11 EST Ventricular Rate:  99 PR Interval:    QRS Duration: 89 QT Interval:  346 QTC Calculation: 444 R Axis:   79 Text Interpretation:  Sinus rhythm No significant change since last tracing Confirmed by Lennice Sites [phone]) on 01/17/2018 4:45:18 PM           Lennice Sites, DO 01/17/18 1647

## 2018-01-17 NOTE — H&P (Signed)
History and Physical    Taylor [name] WGN:[id number] DOB: 10-15-1955 DOA: 01/17/2018  PCP: Clent Demark, PA-C Patient coming from: Home  Chief Complaint: Respiratory distress  HPI: KAYEN GRABEL is a 62 y.o. male with medical history significant of asthma, COPD on 3 L home oxygen, bipolar disorder, depression presenting to the hospital via EMS for evaluation of respiratory distress.  Patient reports having dyspnea on exertion for the past few days.  Denies having any cough or wheezing.  Reports having a lot of heartburn.  Reports having burning chest pain for the past 2 days which is intermittent and occurs at rest.  States his abdomen feels bloated.  Reports having regular bowel movements.  No abdominal pain, nausea, or vomiting.  States his appetite is good.  ED Course: EMS noted patient to have very diminished breath sounds.  He was started on CPAP and received albuterol and Atrovent.  Also received Solu-Medrol 125 mg and magnesium 2 g.  Tachycardic and tachypneic in the ED.  Placed on BiPAP.  Subsequently transitioned to 3 L oxygen via nasal cannula (same as home requirement) and ambulated.  Oxygen saturation between 96 to 98% on home oxygen with ambulation.  ABG done at that time showing pH 7.35, PCO2 46, PO2 88.  Per ED provider, patient continued to complain of dyspnea and had diminished breath sounds.  He was placed back on BiPAP.  His dyspnea is thought to be secondary to possible COPD exacerbation. No leukocytosis.  UA not suggestive of infection.  BNP normal.  I-STAT troponin negative and EKG not suggestive of ACS.  UDS negative. Chest x-ray showing no acute cardiopulmonary process. CT angiogram showing emphysema; no evidence of PE or other acute abnormalities. CT abdomen showing increased stool burden, evidence of left-sided diverticulosis without acute diverticulitis, and no acute abnormality.  Review of Systems: As per HPI otherwise 10 point review of systems  negative.  Past Medical History:  Diagnosis Date  . Asthma   . Bipolar 1 disorder ([place]) 02/05/2012  . Colon cancer ([place]) 04/11/11   adenocarcinoma of colon, 7/19 nodes pos.FINISHED CHEMO/DR. SHERRILL  . COPD (chronic obstructive pulmonary disease) ([place])    SMOKER  . Depression   . Emphysema of lung ([place])   . Full dentures   . Hemorrhoids   . On home oxygen therapy    "3L; 24/7" (05/29/2017)  . Oxygen deficiency   . Pneumonia ~ 2016   "double pneumonia"  . Prostate cancer ([place])    Gleason score = 7, supposed to have radiation therapy but he has not followed up (05/29/2017)  . Rib fractures    hx of    Past Surgical History:  Procedure Laterality Date  . COLON SURGERY  04/11/11   Sigmoid colectomy  . COLOSTOMY REVISION  04/11/2011   Procedure: COLON RESECTION SIGMOID;  Surgeon: [name], MD;  Location: [facility];  Service: General;  Laterality: N/A;  low anterior colon resection   . HERNIA REPAIR    . INGUINAL HERNIA REPAIR Right 05/01/2012   Procedure: HERNIA REPAIR INGUINAL ADULT;  Surgeon: [name], MD;  Location: [facility];  Service: General;  Laterality: Right;  . INSERTION OF MESH Right 05/01/2012   Procedure: INSERTION OF MESH;  Surgeon: [name], MD;  Location: [facility];  Service: General;  Laterality: Right;  . PORT-A-CATH REMOVAL Left 05/01/2012   Procedure: REMOVAL Infusion Port;  Surgeon: [name], MD;  Location: [facility];  Service: General;  Laterality: Left;  .  PORTACATH PLACEMENT  05/02/2011   Procedure: INSERTION PORT-A-CATH;  Surgeon: [name], MD;  Location: [facility];  Service: General;  Laterality: N/A;  . PROSTATE BIOPSY       reports that he quit smoking about 7 months ago. His smoking use included cigarettes. He has a 4.60 pack-year smoking history. He has quit using smokeless tobacco.  His smokeless tobacco use included chew. He reports previous alcohol use. He reports that he does not use drugs.  Allergies  Allergen Reactions  . Codeine Nausea And  Vomiting    Family History  Problem Relation Age of Onset  . Heart disease Father   . Lung cancer Maternal Uncle        smoked    Prior to Admission medications   Medication Sig Start Date End Date Taking? Authorizing Provider  albuterol (PROVENTIL HFA;VENTOLIN HFA) 108 (90 Base) MCG/ACT inhaler Inhale 2 puffs into the lungs every 6 (six) hours as needed for wheezing or shortness of breath. 10/14/17  Yes Varney Biles, MD  albuterol (PROVENTIL) (2.5 MG/3ML) 0.083% nebulizer solution Take 3 mLs (2.5 mg total) by nebulization every 4 (four) hours as needed for wheezing or shortness of breath. 11/18/17  Yes [name], MD  ARIPiprazole (ABILIFY) 5 MG tablet Take 1 tablet (5 mg total) by mouth daily with breakfast. 11/25/17  Yes Virgel Manifold, MD  Blood Glucose Monitoring Suppl (ACCU-CHEK AVIVA) device Use as instructed 10/27/17 10/27/18 Yes Clent Demark, PA-C  busPIRone (BUSPAR) 10 MG tablet Take 10 mg by mouth 2 (two) times daily.   Yes [provider]  escitalopram (LEXAPRO) 20 MG tablet Take 1 tablet (20 mg total) by mouth daily. 11/25/17  Yes Virgel Manifold, MD  Fluticasone-Umeclidin-Vilant (TRELEGY ELLIPTA) 100-62.5-25 MCG/INH AEPB Inhale 1 puff into the lungs daily.   Yes [provider]  folic acid (FOLVITE) 1 MG tablet Take 1 tablet (1 mg total) by mouth daily. Patient taking differently: Take 1 mg by mouth 3 (three) times a week.  10/14/17  Yes Nanavati, Ankit, MD  glucose blood (ACCU-CHEK AVIVA) test strip 1 each by Other route as needed for other. Use as instructed 10/27/17  Yes Clent Demark, PA-C  Lancets (ACCU-CHEK SOFT [facility] lancets 1 each by Other route 2 (two) times daily. Use as instructed 10/27/17  Yes Clent Demark, PA-C  lovastatin (MEVACOR) 20 MG tablet Take 1 tablet (20 mg total) by mouth at bedtime. 10/27/17  Yes Clent Demark, PA-C  metFORMIN (GLUCOPHAGE) 1000 MG tablet Take 1 tablet (1,000 mg total) by mouth 2 (two) times daily  with a meal. 10/27/17  Yes Clent Demark, PA-C  OXYGEN Inhale 3 L into the lungs continuous.    Yes [provider]  predniSONE (DELTASONE) 10 MG tablet Take as directed Patient taking differently: Take 10 mg by mouth See admin instructions. Take 10 mg by mouth two times a day then taper down to 10 mg once a day, as directed 12/29/17  Yes [name], MD  sodium chloride (OCEAN) 0.65 % SOLN nasal spray Place 1 spray into both nostrils as needed for congestion. 11/30/17  Yes Lavina Hamman, MD  benzonatate (TESSALON) 100 MG capsule Take 1 capsule (100 mg total) by mouth every 8 (eight) hours. Patient not taking: Reported on 01/17/2018 01/15/18   Domenic Moras, PA-C  oseltamivir (TAMIFLU) 75 MG capsule Take 1 capsule (75 mg total) by mouth every 12 (twelve) hours. Patient not taking: Reported on 01/17/2018 01/15/18  Domenic Moras, PA-C    Physical Exam: Vitals:   01/18/18 0030 01/18/18 0118 01/18/18 0153 01/18/18 0539  BP: 133/87 (!) 156/89  118/86  Pulse: 93 (!) 124  94  Resp: (!) 21 17  20   Temp:  98.1 F (36.7 C)  97.8 F (36.6 C)  TempSrc:  Oral  Oral  SpO2: 96% 97% 96% 98%  Weight:  79.4 kg    Height:        Physical Exam  Constitutional: He is oriented to person, place, and time. He appears well-developed and well-nourished.  Resting comfortably in hospital stretcher.  No acute distress.  HENT:  Head: Normocephalic.  Eyes: Right eye exhibits no discharge. Left eye exhibits no discharge.  Neck: Neck supple.  Cardiovascular: Normal rate, regular rhythm and intact distal pulses.  Pulmonary/Chest: Effort normal and breath sounds normal. He has no wheezes. He has no rales.  On BiPAP Speaking clearly in full sentences No increased work of breathing Slightly diminished breath sounds  Abdominal: Soft. Bowel sounds are normal. He exhibits distension. There is no abdominal tenderness. There is no rebound and no guarding.  Musculoskeletal:        General: No edema.    Neurological: He is alert and oriented to person, place, and time.  Skin: Skin is warm and dry. He is not diaphoretic.  Psychiatric: He has a normal mood and affect.     Labs on Admission: I have personally reviewed following labs and imaging studies  CBC: Recent Labs  Lab 01/15/18 1859 01/17/18 1454  WBC 11.1* 9.4  NEUTROABS  --  6.7  HGB 13.0 13.2  HCT 41.0 41.8  MCV 92.3 95.0  PLT 234 242   Basic Metabolic Panel: Recent Labs  Lab 01/15/18 1859 01/17/18 1454  NA 138 139  K 4.5 4.4  CL 97* 101  CO2 28 29  GLUCOSE 79 148*  BUN 15 15  CREATININE 0.87 0.69  CALCIUM 9.1 8.6*   GFR: Estimated Creatinine Clearance: 100.7 mL/min (by C-G formula based on SCr of 0.69 mg/dL). Liver Function Tests: Recent Labs  Lab 01/17/18 1454  AST 22  ALT 22  ALKPHOS 52  BILITOT 0.3  PROT 6.3*  ALBUMIN 4.2   No results for input(s): LIPASE, AMYLASE in the last 168 hours. No results for input(s): AMMONIA in the last 168 hours. Coagulation Profile: No results for input(s): INR, PROTIME in the last 168 hours. Cardiac Enzymes: Recent Labs  Lab 01/18/18 0144  TROPONINI <0.03   BNP (last 3 results) No results for input(s): PROBNP in the last 8760 hours. HbA1C: Recent Labs    01/18/18 0144  HGBA1C 6.1*   CBG: No results for input(s): GLUCAP in the last 168 hours. Lipid Profile: No results for input(s): CHOL, HDL, LDLCALC, TRIG, CHOLHDL, LDLDIRECT in the last 72 hours. Thyroid Function Tests: Recent Labs    01/18/18 0144  TSH 0.448   Anemia Panel: No results for input(s): VITAMINB12, FOLATE, FERRITIN, TIBC, IRON, RETICCTPCT in the last 72 hours. Urine analysis:    Component Value Date/Time   COLORURINE YELLOW 01/17/2018 [place] 01/17/2018 1731   LABSPEC 1.036 (H) 01/17/2018 1731   PHURINE 5.0 01/17/2018 1731   GLUCOSEU >=500 (A) 01/17/2018 1731   HGBUR NEGATIVE 01/17/2018 [place] 01/17/2018 1731   KETONESUR 5 (A)  01/17/2018 1731   PROTEINUR NEGATIVE 01/17/2018 1731   UROBILINOGEN 1.0 01/09/2013 1843   NITRITE NEGATIVE 01/17/2018 1731   LEUKOCYTESUR NEGATIVE 01/17/2018 1731  Radiological Exams on Admission: Ct Angio Chest Pe W And/or Wo Contrast  Result Date: 01/17/2018 CLINICAL DATA:  Shortness of breath. EXAM: CT ANGIOGRAPHY CHEST WITH CONTRAST TECHNIQUE: Multidetector CT imaging of the chest was performed using the standard protocol during bolus administration of intravenous contrast. Multiplanar CT image reconstructions and MIPs were obtained to evaluate the vascular anatomy. CONTRAST:  188mL ISOVUE-370 IOPAMIDOL (ISOVUE-370) INJECTION 76% COMPARISON:  Chest x-ray dated 01/17/2018 and CT scan of the chest dated 08/24/2017 FINDINGS: Cardiovascular: Satisfactory opacification of the pulmonary arteries to the segmental level. No evidence of pulmonary embolism. Normal heart size. No pericardial effusion. Minimal aortic atherosclerosis. Mediastinum/Nodes: No enlarged mediastinal, hilar, or axillary lymph nodes. Thyroid gland, trachea, and esophagus demonstrate no significant findings. Lungs/Pleura: Emphysematous changes. No infiltrates or effusions. Upper Abdomen: Negative. Musculoskeletal: No acute abnormalities. Review of the MIP images confirms the above findings. IMPRESSION: No pulmonary emboli or other acute abnormalities. Emphysema. Electronically Signed   By: Lorriane Shire M.D.   On: 01/17/2018 18:12   Ct Abdomen Pelvis W Contrast  Result Date: 01/17/2018 CLINICAL DATA:  Acute generalized abdominal pain EXAM: CT ABDOMEN AND PELVIS WITH CONTRAST TECHNIQUE: Multidetector CT imaging of the abdomen and pelvis was performed using the standard protocol following bolus administration of intravenous contrast. CONTRAST:  118mL ISOVUE-370 IOPAMIDOL (ISOVUE-370) INJECTION 76% COMPARISON:  08/31/2017 CT FINDINGS: Lower chest: Normal heart size with trace pericardial effusion. No thickening. No acute pulmonary  abnormality. Hepatobiliary: No focal liver abnormality is seen. No gallstones, gallbladder wall thickening, or biliary dilatation. Pancreas: Unremarkable. No pancreatic ductal dilatation or surrounding inflammatory changes. Spleen: Normal in size without focal abnormality. Adrenals/Urinary Tract: Adrenal glands are unremarkable. Bilateral renal cysts are identified the largest is 13 mm arising off the posterior interpolar aspect of the right kidney with smaller 11 mm cyst arising off the lower pole of the left kidney. No nephrolithiasis or solid enhancing masses. These appear stable. Bladder is physiologically distended without focal mural thickening and free of calculi. Stomach/Bowel: Large amount of retained stool throughout the colon without inflammation. Left-sided colonic diverticulosis without acute diverticulitis. Patient is status post sigmoid resection by report. Small hiatal hernia. Physiologic distention of the stomach. Normal small bowel rotation. No small bowel obstruction or inflammation. Vascular/Lymphatic: Moderate aortoiliac and branch vessel atherosclerosis without aneurysm. No adenopathy. Reproductive: Normal size prostate. Seminal vesicles are unremarkable. Other: Status post right inguinal hernia repair with fat containing left inguinal hernia. Musculoskeletal: Degenerative change along the dorsal spine with Schmorl's nodes at T11 along the superior and inferior endplates and superior endplate of N86. Moderate disc flattening from L2 through L5. No listhesis. IMPRESSION: 1. Increased colonic stool burden without bowel obstruction or inflammation. Left-sided colonic diverticulosis without acute diverticulitis. 2. Stable bilateral renal cysts. 3. Moderate aortoiliac and branch vessel atherosclerosis without aneurysm. 4. Status post right inguinal hernia repair with fat containing left inguinal hernia. Electronically Signed   By: Ashley Royalty M.D.   On: 01/17/2018 18:16   Dg Chest Port 1  View  Result Date: 01/17/2018 CLINICAL DATA:  Patient with increased difficulty breathing. EXAM: PORTABLE CHEST 1 VIEW COMPARISON:  Chest radiograph 01/15/2018 FINDINGS: Monitoring leads overlie the patient. Normal cardiac and mediastinal contours. No consolidative pulmonary opacities. No pleural effusion or pneumothorax. Old right upper rib fractures. IMPRESSION: No acute cardiopulmonary process. Electronically Signed   By: Lovey Newcomer M.D.   On: 01/17/2018 15:41    EKG: Independently reviewed.  Sinus rhythm (heart rate 99).  No significant change since prior tracing.  Assessment/Plan  Principal Problem:   Respiratory distress Active Problems:   Depression   Bipolar 1 disorder (HCC)   Chest pain   Abdominal distention   Physical deconditioning   Pre-diabetes  Respiratory distress Unclear etiology, ?COPD exacerbation. Patient reports having dyspnea on exertion but no cough or wheezing.  Oxygen saturation between 96 to 98% on home oxygen with ambulation. Based on BiPAP in the ED as patient was complaining of dyspnea and had diminished breath sounds.  BNP normal.  Does not appear volume overloaded on exam. Chest x-ray showing no acute cardiopulmonary process. CT angiogram showing emphysema; no evidence of PE or other acute abnormalities.  Currently on 3 L supplemental oxygen (same as home requirement).  Currently not tachycardic or tachypneic, vitals improved. -Received Solu-Medrol 125 mg and magnesium 2 g in the ED.  Continue prednisone 40 mg daily starting in the morning. -Levalbuterol-ipratropium every 6 hours -Ambulate in the morning and check pulse ox -Continue supplemental oxygen  Physical deconditioning -PT evaluation  Abdominal distention Patient is complaining of abdominal bloating.  Reports having regular bowel movements.  No nausea, vomiting, or abdominal pain.  His appetite is good. CT abdomen showing increased stool burden, evidence of left-sided diverticulosis without acute  diverticulitis, and no acute abnormality. -MiraLAX as needed constipation  Chest pain Burning chest pain at rest.  Reports having a lot of heartburn.  Appears comfortable on exam and in no distress. Troponin x2 negative and EKG not suggestive of ACS.  -Trend troponin -Protonix 40 mg daily -GI cocktail as needed  Prediabetes UA with evidence of glucosuria.  A1c 6.1. -Outpatient follow-up -Lifestyle modification counseling  Bipolar disorder, depression -Continue home Abilify, BuSpar, Lexapro  Hyperlipidemia -Continue statin  DVT prophylaxis: Lovenox Code Status: Full code.  Discussed with patient. Family Communication: No family available. Disposition Plan: Anticipate discharge in 1 to 2 days. Consults called: None Admission status: Observation, telemetry   Shela Leff MD [facility] Pager [phone]  If 7PM-7AM, please contact night-coverage [email or web address] Password [facility]  01/18/2018, 7:24 AM

## 2018-01-17 NOTE — ED Notes (Signed)
Bed: RESA Expected date:  Expected time:  Means of arrival:  Comments: EMS/shob

## 2018-01-17 NOTE — ED Triage Notes (Signed)
EMS states pt called due to increased difficulty breathing. Upon arrival very demimished. Started on CPAP, breathing a little easier upon arrival to hospital, 10mg  Albuterol, 1 Atrovent given en route. Solumedrol 125mg  and Mag 2 grms given. Via IV right hand #22. CBG 139 145/91-106-36- 100% wears O2 3 l at home.

## 2018-01-17 NOTE — ED Notes (Signed)
ED TO INPATIENT HANDOFF REPORT  Name/Age/Gender Taylor [name] 62 y.o. male  Code Status Code Status History    Date Active Date Inactive Code Status Order ID Comments User Context   11/28/2017 2318 11/30/2017 1914 Full Code [id number]  Toy Baker, MD Inpatient   11/13/2017 0544 11/15/2017 1830 Full Code [id number]  Vianne Bulls, MD Inpatient   10/24/2017 2231 10/25/2017 1959 Full Code [id number]  Lenore Cordia, MD ED   09/28/2017 1840 10/03/2017 2316 DNR [id number]  Cristal Deer, MD Inpatient   09/28/2017 1620 09/28/2017 1840 Full Code [id number]  Cristal Deer, MD Inpatient   09/22/2017 2038 09/25/2017 1839 Full Code [id number]  Elwyn Reach, MD Inpatient   09/14/2017 2014 09/19/2017 1836 DNR [id number]  Mariel Aloe, MD Inpatient   09/11/2017 0045 09/12/2017 1527 Full Code [id number]  Etta Quill, DO ED   09/04/2017 2324 09/07/2017 2203 Full Code [id number]  Vianne Bulls, MD ED   08/31/2017 0014 09/02/2017 0026 Full Code [id number]  Katherine Roan, MD ED   08/24/2017 2059 08/27/2017 0325 Partial Code [id number]  Kathi Ludwig, MD ED   06/13/2017 0336 06/18/2017 0200 Partial Code [id number]  Rise Patience, MD ED   06/08/2017 0454 06/09/2017 1757 Partial Code [id number]  [name], MD ED   05/29/2017 0020 06/02/2017 1608 Partial Code [id number]  Vianne Bulls, MD ED   05/21/2017 0252 05/27/2017 0006 Partial Code [id number]  Rise Patience, MD ED   05/13/2017 1145 05/18/2017 1405 Partial Code [id number]  Karmen Bongo, MD ED   05/13/2017 0948 05/13/2017 1145 Full Code [id number]  Samella Parr, NP ED   04/28/2017 0339 05/01/2017 1920 Full Code [id number]  Rise Patience, MD ED   04/18/2017 1223 04/19/2017 1723 Full Code [id number]  Georgette Shell, MD ED   04/06/2017 0022 04/09/2017 2041 Full Code [id number]  Norval Morton, MD ED   03/28/2017 0252 03/28/2017 2203 Full Code [id number]  Shanon Rosser, MD ED   03/24/2017 0036 03/27/2017 1538 Full Code [id number]  Norval Morton,  MD ED   10/22/2016 2241 10/24/2016 1901 DNR [id number]  Karmen Bongo, MD ED   01/09/2013 2341 01/22/2013 1959 Full Code [id number]  Toy Baker, MD Inpatient   02/03/2012 1704 02/06/2012 1626 Full Code [id number]  Stacie Glaze, RN ED      Home/SNF/Other Home  Chief Complaint Shortness of breath  Level of Care/Admitting Diagnosis ED Disposition    ED Disposition Condition [place]: [facility] [[id number]]  Level of Care: Stepdown [14]  Admit to SDU based on following criteria: Respiratory Distress:  Frequent assessment and/or intervention to maintain adequate ventilation/respiration, pulmonary toilet, and respiratory treatment.  Diagnosis: Acute respiratory failure ([place]) [518.81.ICD-9-CM]  Admitting Physician: Shela Leff [[id number]]  Attending Physician: Shela Leff [[id number]]  PT Class (Do Not Modify): Observation [104]  PT Acc Code (Do Not Modify): Observation [10022]       Medical History Past Medical History:  Diagnosis Date  . Asthma   . Bipolar 1 disorder ([place]) 02/05/2012  . Colon cancer ([place]) 04/11/11   adenocarcinoma of colon, 7/19 nodes pos.FINISHED CHEMO/DR. SHERRILL  . COPD (chronic obstructive pulmonary disease) ([place])    SMOKER  . Depression   . Emphysema of lung ([place])   . Full dentures   . Hemorrhoids   . On home oxygen therapy    "3L; 24/7" (05/29/2017)  . Oxygen deficiency   . Pneumonia ~ 2016   "double pneumonia"  .  Prostate cancer ([place])    Gleason score = 7, supposed to have radiation therapy but he has not followed up (05/29/2017)  . Rib fractures    hx of    Allergies Allergies  Allergen Reactions  . Codeine Nausea And Vomiting    IV Location/Drains/Wounds Patient Lines/Drains/Airways Status   Active Line/Drains/Airways    Name:   Placement date:   Placement time:   Site:   Days:   Peripheral IV 01/17/18 Right Hand   01/17/18    1436    Hand   less than 1   Peripheral IV 01/17/18  Right Forearm   01/17/18    1555    Forearm   less than 1          Labs/Imaging Results for orders placed or performed during the hospital encounter of 01/17/18 (from the past 48 hour(s))  Comprehensive metabolic panel     Status: Abnormal   Collection Time: 01/17/18  2:54 PM  Result Value Ref Range   Sodium 139 135 - 145 mmol/L   Potassium 4.4 3.5 - 5.1 mmol/L   Chloride 101 98 - 111 mmol/L   CO2 29 22 - 32 mmol/L   Glucose, Bld 148 (H) 70 - 99 mg/dL   BUN 15 8 - 23 mg/dL   Creatinine, Ser 0.69 0.61 - 1.24 mg/dL   Calcium 8.6 (L) 8.9 - 10.3 mg/dL   Total Protein 6.3 (L) 6.5 - 8.1 g/dL   Albumin 4.2 3.5 - 5.0 g/dL   AST 22 15 - 41 U/L   ALT 22 0 - 44 U/L   Alkaline Phosphatase 52 38 - 126 U/L   Total Bilirubin 0.3 0.3 - 1.2 mg/dL   GFR calc non Af Amer >60 >60 mL/min   GFR calc Af Amer >60 >60 mL/min   Anion gap 9 5 - 15    Comment: Performed at [facility], [place] [address] [place], [place] [address]  CBC with Differential     Status: Abnormal   Collection Time: 01/17/18  2:54 PM  Result Value Ref Range   WBC 9.4 4.0 - 10.5 K/uL   RBC 4.40 4.22 - 5.81 MIL/uL   Hemoglobin 13.2 13.0 - 17.0 g/dL   HCT 41.8 39.0 - 52.0 %   MCV 95.0 80.0 - 100.0 fL   MCH 30.0 26.0 - 34.0 pg   MCHC 31.6 30.0 - 36.0 g/dL   RDW 13.2 11.5 - 15.5 %   Platelets 209 150 - 400 K/uL   nRBC 0.0 0.0 - 0.2 %   Neutrophils Relative % 71 %   Neutro Abs 6.7 1.7 - 7.7 K/uL   Lymphocytes Relative 18 %   Lymphs Abs 1.7 0.7 - 4.0 K/uL   Monocytes Relative 5 %   Monocytes Absolute 0.5 0.1 - 1.0 K/uL   Eosinophils Relative 0 %   Eosinophils Absolute 0.0 0.0 - 0.5 K/uL   Basophils Relative 1 %   Basophils Absolute 0.1 0.0 - 0.1 K/uL   Immature Granulocytes 5 %   Abs Immature Granulocytes 0.45 (H) 0.00 - 0.07 K/uL    Comment: Performed at [facility], [place] [address], [place], [place] [address]  Brain natriuretic peptide     Status: None   Collection Time: 01/17/18   2:54 PM  Result Value Ref Range   B Natriuretic Peptide 26.3 0.0 - 100.0 pg/mL    Comment: Performed at [facility], [place] [address] [place], [place] [address]  I-stat troponin, ED     Status: None   Collection Time: 01/17/18  3:20 PM  Result Value Ref Range   Troponin i, poc 0.00 0.00 - 0.08 ng/mL   Comment 3            Comment: Due to the release kinetics of cTnI, a negative result within the first hours of the onset of symptoms does not rule out myocardial infarction with certainty. If myocardial infarction is still suspected, repeat the test at appropriate intervals.   Urine rapid drug screen (hosp performed)     Status: None   Collection Time: 01/17/18  5:30 PM  Result Value Ref Range   Opiates NONE DETECTED NONE DETECTED   Cocaine NONE DETECTED NONE DETECTED   Benzodiazepines NONE DETECTED NONE DETECTED   Amphetamines NONE DETECTED NONE DETECTED   Tetrahydrocannabinol NONE DETECTED NONE DETECTED   Barbiturates NONE DETECTED NONE DETECTED    Comment: (NOTE) DRUG SCREEN FOR MEDICAL PURPOSES ONLY.  IF CONFIRMATION IS NEEDED FOR ANY PURPOSE, NOTIFY LAB WITHIN 5 DAYS. LOWEST DETECTABLE LIMITS FOR URINE DRUG SCREEN Drug Class                     Cutoff (ng/mL) Amphetamine and metabolites    1000 Barbiturate and metabolites    200 Benzodiazepine                 629 Tricyclics and metabolites     300 Opiates and metabolites        300 Cocaine and metabolites        300 THC                            50 Performed at [facility], [place] [address], [place], [place] [address]   Urinalysis, Routine w reflex microscopic     Status: Abnormal   Collection Time: 01/17/18  5:31 PM  Result Value Ref Range   Color, Urine YELLOW YELLOW   APPearance CLEAR CLEAR   Specific Gravity, Urine 1.036 (H) 1.005 - 1.030   pH 5.0 5.0 - 8.0   Glucose, UA >=500 (A) NEGATIVE mg/dL   Hgb urine dipstick NEGATIVE NEGATIVE   Bilirubin Urine NEGATIVE NEGATIVE    Ketones, ur 5 (A) NEGATIVE mg/dL   Protein, ur NEGATIVE NEGATIVE mg/dL   Nitrite NEGATIVE NEGATIVE   Leukocytes, UA NEGATIVE NEGATIVE   RBC / HPF 0-5 0 - 5 RBC/hpf   WBC, UA 0-5 0 - 5 WBC/hpf   Bacteria, UA NONE SEEN NONE SEEN   Mucus PRESENT    Hyaline Casts, UA PRESENT     Comment: Performed at [facility], [place] [address], [place],  [address]  Blood gas, arterial     Status: None   Collection Time: 01/17/18  7:28 PM  Result Value Ref Range   O2 Content 3.0 L/min   Delivery systems NASAL CANNULA    pH, Arterial 7.350 7.350 - 7.450   pCO2 arterial 46.0 32.0 - 48.0 mmHg   pO2, Arterial 88.8 83.0 - 108.0 mmHg   Bicarbonate 24.7 20.0 - 28.0 mmol/L   Acid-base deficit 0.6 0.0 - 2.0 mmol/L   O2 Saturation 97.4 %   Patient temperature 98.6    Collection site LEFT RADIAL    Drawn by [id number]    Sample type ARTERIAL DRAW    Allens test (pass/fail) PASS PASS    Comment: Performed at [facility], [place] [address], [place], [place] [address]   Ct Angio Chest Pe W And/or Wo Contrast  Result Date: 01/17/2018 CLINICAL DATA:  Shortness of breath. EXAM: CT ANGIOGRAPHY CHEST WITH CONTRAST TECHNIQUE: Multidetector CT imaging of the chest was performed using the standard protocol during bolus administration of intravenous contrast. Multiplanar CT image reconstructions and MIPs were obtained to evaluate the vascular anatomy. CONTRAST:  171mL ISOVUE-370 IOPAMIDOL (ISOVUE-370) INJECTION 76% COMPARISON:  Chest x-ray dated 01/17/2018 and CT scan of the chest dated 08/24/2017 FINDINGS: Cardiovascular: Satisfactory opacification of the pulmonary arteries to the segmental level. No evidence of pulmonary embolism. Normal heart size. No pericardial effusion. Minimal aortic atherosclerosis. Mediastinum/Nodes: No enlarged mediastinal, hilar, or axillary lymph nodes. Thyroid gland, trachea, and esophagus demonstrate no significant findings. Lungs/Pleura: Emphysematous  changes. No infiltrates or effusions. Upper Abdomen: Negative. Musculoskeletal: No acute abnormalities. Review of the MIP images confirms the above findings. IMPRESSION: No pulmonary emboli or other acute abnormalities. Emphysema. Electronically Signed   By: Lorriane Shire M.D.   On: 01/17/2018 18:12   Ct Abdomen Pelvis W Contrast  Result Date: 01/17/2018 CLINICAL DATA:  Acute generalized abdominal pain EXAM: CT ABDOMEN AND PELVIS WITH CONTRAST TECHNIQUE: Multidetector CT imaging of the abdomen and pelvis was performed using the standard protocol following bolus administration of intravenous contrast. CONTRAST:  148mL ISOVUE-370 IOPAMIDOL (ISOVUE-370) INJECTION 76% COMPARISON:  08/31/2017 CT FINDINGS: Lower chest: Normal heart size with trace pericardial effusion. No thickening. No acute pulmonary abnormality. Hepatobiliary: No focal liver abnormality is seen. No gallstones, gallbladder wall thickening, or biliary dilatation. Pancreas: Unremarkable. No pancreatic ductal dilatation or surrounding inflammatory changes. Spleen: Normal in size without focal abnormality. Adrenals/Urinary Tract: Adrenal glands are unremarkable. Bilateral renal cysts are identified the largest is 13 mm arising off the posterior interpolar aspect of the right kidney with smaller 11 mm cyst arising off the lower pole of the left kidney. No nephrolithiasis or solid enhancing masses. These appear stable. Bladder is physiologically distended without focal mural thickening and free of calculi. Stomach/Bowel: Large amount of retained stool throughout the colon without inflammation. Left-sided colonic diverticulosis without acute diverticulitis. Patient is status post sigmoid resection by report. Small hiatal hernia. Physiologic distention of the stomach. Normal small bowel rotation. No small bowel obstruction or inflammation. Vascular/Lymphatic: Moderate aortoiliac and branch vessel atherosclerosis without aneurysm. No adenopathy.  Reproductive: Normal size prostate. Seminal vesicles are unremarkable. Other: Status post right inguinal hernia repair with fat containing left inguinal hernia. Musculoskeletal: Degenerative change along the dorsal spine with Schmorl's nodes at T11 along the superior and inferior endplates and superior endplate of O24. Moderate disc flattening from L2 through L5. No listhesis. IMPRESSION: 1. Increased colonic stool burden without bowel obstruction or inflammation. Left-sided colonic diverticulosis without acute diverticulitis. 2. Stable bilateral renal cysts. 3. Moderate aortoiliac and branch vessel atherosclerosis without aneurysm. 4. Status post right inguinal hernia repair with fat containing left inguinal hernia. Electronically Signed   By: Ashley Royalty M.D.   On: 01/17/2018 18:16   Dg Chest Port 1 View  Result Date: 01/17/2018 CLINICAL DATA:  Patient with increased difficulty breathing. EXAM: PORTABLE CHEST 1 VIEW COMPARISON:  Chest radiograph 01/15/2018 FINDINGS: Monitoring leads overlie the patient. Normal cardiac and mediastinal contours. No consolidative pulmonary opacities. No pleural effusion or pneumothorax. Old right upper rib fractures. IMPRESSION: No acute cardiopulmonary process. Electronically Signed   By: Lovey Newcomer M.D.   On: 01/17/2018 15:41   EKG Interpretation  Date/Time:  Saturday January 17 2018 15:08:11 EST Ventricular Rate:  99 PR Interval:    QRS Duration: 89 QT Interval:  346 QTC Calculation: 444 R Axis:   79 Text Interpretation:  Sinus rhythm No significant change since last tracing Confirmed by Lennice Sites [phone]) on 01/17/2018 4:45:18 PM   Pending Labs Unresulted Labs (From admission, onward)   None      Vitals/Pain Today's Vitals   01/17/18 2130 01/17/18 2200 01/17/18 2230 01/17/18 2300  BP: 122/81 (!) 123/93 131/83 113/77  Pulse: (!) 119 (!) 124 (!) 127 (!) 123  Resp: (!) 22 (!) 24 (!) 27 (!) 25  Temp:      TempSrc:      SpO2: 100% 99% 98% 100%   Weight:      Height:      PainSc:        Isolation Precautions No active isolations  Medications Medications  albuterol (PROVENTIL) (2.5 MG/3ML) 0.083% nebulizer solution 5 mg (5 mg Nebulization Not Given 01/17/18 1458)  sodium chloride 0.9 % bolus 500 mL (500 mLs Intravenous New Bag/Given 01/17/18 2208)  albuterol (PROVENTIL,VENTOLIN) solution continuous neb (10 mg/hr Nebulization Given 01/17/18 1508)  sodium chloride (PF) 0.9 % injection (  Given by Other 01/17/18 1918)  iopamidol (ISOVUE-370) 76 % injection 100 mL (100 mLs Intravenous Contrast Given 01/17/18 1705)  sodium chloride 0.9 % bolus 500 mL (0 mLs Intravenous Stopped 01/17/18 1924)  LORazepam (ATIVAN) injection 1 mg (1 mg Intravenous Given 01/17/18 1852)    Mobility walks

## 2018-01-18 DIAGNOSIS — F319 Bipolar disorder, unspecified: Secondary | ICD-10-CM | POA: Diagnosis not present

## 2018-01-18 DIAGNOSIS — R5381 Other malaise: Secondary | ICD-10-CM | POA: Diagnosis not present

## 2018-01-18 DIAGNOSIS — R7303 Prediabetes: Secondary | ICD-10-CM

## 2018-01-18 DIAGNOSIS — F339 Major depressive disorder, recurrent, unspecified: Secondary | ICD-10-CM | POA: Diagnosis not present

## 2018-01-18 DIAGNOSIS — R079 Chest pain, unspecified: Secondary | ICD-10-CM | POA: Diagnosis not present

## 2018-01-18 DIAGNOSIS — J9621 Acute and chronic respiratory failure with hypoxia: Secondary | ICD-10-CM

## 2018-01-18 DIAGNOSIS — J441 Chronic obstructive pulmonary disease with (acute) exacerbation: Secondary | ICD-10-CM

## 2018-01-18 LAB — GLUCOSE, CAPILLARY
Glucose-Capillary: 166 mg/dL — ABNORMAL HIGH (ref 70–99)
Glucose-Capillary: 166 mg/dL — ABNORMAL HIGH (ref 70–99)
Glucose-Capillary: 286 mg/dL — ABNORMAL HIGH (ref 70–99)

## 2018-01-18 LAB — TROPONIN I
Troponin I: <0.03 ng/mL (ref <0.03)
Troponin I: <0.03 ng/mL (ref <0.03)
Troponin I: <0.03 ng/mL (ref <0.03)

## 2018-01-18 LAB — HEMOGLOBIN A1C
Hgb A1c MFr Bld: 6.1 % — ABNORMAL HIGH (ref 4.8–5.6)
Mean Plasma Glucose: 128.37 mg/dL

## 2018-01-18 LAB — TSH: TSH: 0.448 u[IU]/mL (ref 0.350–4.500)

## 2018-01-18 MED ORDER — MOMETASONE FURO-FORMOTEROL FUM 100-5 MCG/ACT IN AERO
2.0000 | INHALATION_SPRAY | Freq: Two times a day (BID) | RESPIRATORY_TRACT | Status: DC
  Filled 2018-01-18: qty 8.8

## 2018-01-18 MED ORDER — METHYLPREDNISOLONE SODIUM SUCC 125 MG IJ SOLR
60.0000 mg | Freq: Three times a day (TID) | INTRAMUSCULAR | Status: DC
  Administered 2018-01-18 – 2018-01-19 (×3): 60 mg via INTRAVENOUS
  Filled 2018-01-18 (×3): qty 2

## 2018-01-18 MED ORDER — SENNOSIDES-DOCUSATE SODIUM 8.6-50 MG PO TABS
1.0000 | ORAL_TABLET | Freq: Two times a day (BID) | ORAL | Status: DC
  Administered 2018-01-18 – 2018-01-19 (×2): 1 via ORAL
  Filled 2018-01-18 (×2): qty 1

## 2018-01-18 MED ORDER — AZITHROMYCIN 250 MG PO TABS
500.0000 mg | ORAL_TABLET | Freq: Every day | ORAL | Status: DC
  Administered 2018-01-18 – 2018-01-19 (×2): 500 mg via ORAL
  Filled 2018-01-18 (×2): qty 2

## 2018-01-18 MED ORDER — POLYETHYLENE GLYCOL 3350 17 G PO PACK
17.0000 g | PACK | Freq: Every day | ORAL | Status: DC
  Filled 2018-01-18: qty 1

## 2018-01-18 MED ORDER — MOMETASONE FURO-FORMOTEROL FUM 100-5 MCG/ACT IN AERO
2.0000 | INHALATION_SPRAY | Freq: Two times a day (BID) | RESPIRATORY_TRACT | Status: DC
  Administered 2018-01-18 – 2018-01-19 (×2): 2 via RESPIRATORY_TRACT

## 2018-01-18 MED ORDER — POLYETHYLENE GLYCOL 3350 17 G PO PACK: 17.0000 g | PACK | Freq: Every day | ORAL | Status: DC | PRN

## 2018-01-18 MED ORDER — ACETAMINOPHEN 325 MG PO TABS
650.0000 mg | ORAL_TABLET | Freq: Four times a day (QID) | ORAL | Status: DC | PRN
  Administered 2018-01-18: 650 mg via ORAL
  Filled 2018-01-18: qty 2

## 2018-01-18 NOTE — Evaluation (Signed)
Physical Therapy Evaluation Patient Details Name: Taylor [name] MRN: [id number] DOB: 09-20-1955 Today's Date: 01/18/2018   History of Present Illness  62 y.o. male with medical history significant of asthma, COPD on 3 L home oxygen, bipolar disorder, depression; pt admitted through ED for respiratory distress, DOE  Clinical Impression  Patient evaluated by Physical Therapy with no further acute PT needs identified. All education has been completed and the patient has no further questions.  Pt  Able to amb ~ 75' on 3L, SpO2 = 93-98%; See below for any follow-up Physical Therapy or equipment needs. PT is signing off. Thank you for this referral.     Follow Up Recommendations No PT follow up    Equipment Recommendations  None recommended by PT    Recommendations for Other Services       Precautions / Restrictions Precautions Precaution Comments: monitor sats/O2 dependent  Restrictions Weight Bearing Restrictions: No      Mobility  Bed Mobility Overal bed mobility: Modified Independent                Transfers Overall transfer level: Modified independent                  Ambulation/Gait Ambulation/Gait assistance: Supervision;Modified independent (Device/Increase time) Gait Distance (Feet): 75 Feet Assistive device: None Gait Pattern/deviations: Step-through pattern;Decreased stride length     General Gait Details: cues for breathing/self awareness/fatigue   Stairs            Wheelchair Mobility    Modified Rankin (Stroke Patients Only)       Balance Overall balance assessment: Needs assistance Sitting-balance support: No upper extremity supported;Feet supported Sitting balance-Leahy Scale: Good         Standing balance comment: fair to good, able to tol min challenges                             Pertinent Vitals/Pain Pain Assessment: No/denies pain    Home Living Family/patient expects to be discharged to::  Private residence(motel suite) Living Arrangements: Alone   Type of Home: Other(Comment) Home Access: Level entry     Home Layout: One level   Additional Comments: no DME, only O2/concentrator    Prior Function           Comments: Limited ambulator d/t DOE. Reports he has transportation to doctor appointments through [facility]. Friend does grocery shopping, cooks his own meals     Hand Dominance        Extremity/Trunk Assessment   Upper Extremity Assessment Upper Extremity Assessment: Defer to OT evaluation    Lower Extremity Assessment Lower Extremity Assessment: Overall WFL for tasks assessed       Communication      Cognition Arousal/Alertness: Awake/alert Behavior During Therapy: WFL for tasks assessed/performed Overall Cognitive Status: Within Functional Limits for tasks assessed                                        General Comments      Exercises     Assessment/Plan    PT Assessment Patent does not need any further PT services  PT Problem List         PT Treatment Interventions      PT Goals (Current goals can be found in the Care Plan section)  Acute Rehab PT Goals PT Goal Formulation:  All assessment and education complete, DC therapy    Frequency     Barriers to discharge        Co-evaluation               AM-PAC PT "6 Clicks" Mobility  Outcome Measure Help needed turning from your back to your side while in a flat bed without using bedrails?: None Help needed moving from lying on your back to sitting on the side of a flat bed without using bedrails?: None Help needed moving to and from a bed to a chair (including a wheelchair)?: None Help needed standing up from a chair using your arms (e.g., wheelchair or bedside chair)?: None Help needed to walk in hospital room?: None Help needed climbing 3-5 steps with a railing? : None 6 Click Score: 24    End of Session Equipment Utilized During Treatment: Gait  belt Activity Tolerance: Patient tolerated treatment well Patient left: in bed   PT Visit Diagnosis: Difficulty in walking, not elsewhere classified (R26.2)    Time: 7616-0737 PT Time Calculation (min) (ACUTE ONLY): 21 min   Charges:   PT Evaluation $PT Eval Low Complexity: 1 Low          Kenyon Ana, PT  Pager: [phone] Acute Rehab Dept [facility]): [phone]   01/18/2018   [facility] 01/18/2018, 4:20 PM

## 2018-01-18 NOTE — Progress Notes (Signed)
OT Evaluation  PTA, pt independent with ADL and mobility and lived in a hotel. Pt uses transportation through Medicaid/taxies to appointments and to complete IADL tasks. Pt given energy conservation handouts last hospital admission. Reviewed energy conservation and activity modification. O2 sats 99 at rest on 3L. Dyspnea 2/4 with O2 sats 95 on 3L after ADL activity. No further OT needs. OT signing off.     01/18/18 1617  OT Visit Information  Last OT Received On 01/18/18  Assistance Needed +1  History of Present Illness 62 y.o. male with medical history significant of asthma, COPD on 3 L home oxygen, bipolar disorder, depression; pt admitted through ED for respiratory distress, DOE  Precautions  Precaution Comments monitor sats/O2 dependent   Restrictions  Weight Bearing Restrictions No  Home Living  Family/patient expects to be discharged to: Private residence Public relations account executive)  Living Arrangements Alone  Type of Home Other(Comment)  Home Access Level entry  [place] One level  Bathroom Biomedical scientist Yes  How Accessible Accessible via [employer] - built in  Additional Comments  only Dealer  Prior Function  Level of Independence Independent  Comments Limited ambulator d/t DOE. Reports he has transportation to doctor appointments through [facility]. Friend does grocery shopping, cooks his own meals; uses scooter at [employer] No difficulties  Cognition  Arousal/Alertness Awake/alert  Behavior During Therapy WFL for tasks assessed/performed  Overall Cognitive Status Within Functional Limits for tasks assessed  Bed Mobility  Overal bed mobility Modified Independent  Transfers  Overall transfer level Modified independent  Balance  Overall balance assessment Needs assistance  Sitting-balance support No upper extremity supported;Feet supported  Sitting  balance-Leahy Scale Good  Standing balance comment fair to good, able to tol min challenges  OT - End of Session  Equipment Utilized During Treatment Oxygen (3L)  Activity Tolerance Patient tolerated treatment well  Patient left in bed;with call bell/phone within reach  Nurse Communication Mobility status  OT Assessment  OT Recommendation/Assessment Patient does not need any further OT services  OT Visit Diagnosis Muscle weakness (generalized) (M62.81)  OT Problem List Cardiopulmonary status limiting activity;Decreased activity tolerance  AM-PAC OT "6 Clicks" Daily Activity Outcome Measure (Version 2)  Help from another person eating meals? 4  Help from another person taking care of personal grooming? 4  Help from another person toileting, which includes using toliet, bedpan, or urinal? 4  Help from another person bathing (including washing, rinsing, drying)? 4  Help from another person to put on and taking off regular upper body clothing? 4  Help from another person to put on and taking off regular lower body clothing? 4  6 Click Score 24  OT Recommendation  Follow Up Recommendations No OT follow up  OT Equipment None recommended by OT  Acute Rehab OT Goals  Patient Stated Goal to breath better  OT Goal Formulation All assessment and education complete, DC therapy  OT Time Calculation  OT Start Time (ACUTE ONLY) 1628  OT Stop Time (ACUTE ONLY) 1640  OT Time Calculation (min) 12 min  OT General Charges  $OT Visit 1 Visit  OT Evaluation  $OT Eval Low Complexity 1 Low  Written Expression  Dominant Hand Right  Maurie Boettcher, OT/L   Acute OT Clinical Specialist Acute Rehabilitation Services Pager [phone] Office [phone]

## 2018-01-18 NOTE — Progress Notes (Addendum)
Patient ID: Taylor [name], male   DOB: 1955/09/22, 62 y.o.   MRN: [id number]  PROGRESS NOTE    Taylor [name]  JKK:[id number] DOB: 17-Oct-1955 DOA: 01/17/2018 PCP: Clent Demark, PA-C   Brief Narrative:  62 year old male with history of asthma/COPD, chronic hypoxic respiratory failure on 3 L oxygen via nasal cannula at home, bipolar disorder, depression presented on 01/17/2018 for respiratory distress.  He was started on BiPAP and given intravenous Solu-Medrol.  He was admitted for COPD exacerbation.  CT angiogram of the chest showed emphysema with no PE or other acute abnormalities.  CT abdomen showed increased stool burden along with left-sided diverticulosis without diverticulitis.   Assessment & Plan:   Principal Problem:   Respiratory distress Active Problems:   Depression   Bipolar 1 disorder (HCC)   Chest pain   Abdominal distention   Physical deconditioning   Pre-diabetes   Acute on chronic hypoxic respiratory failure -Probably from COPD exacerbation.  Initially required BiPAP.  Currently on nasal cannula oxygen at 3 L/min which is his baseline  COPD exacerbation -Treated with Solu-Medrol in the ED.  Still feels short of breath and is wheezing.  Will discontinue prednisone and start Solu-Medrol 60 mg IV every 8 hours.  Continue nebs.  Add Dulera -No evidence of PE on CT angiogram.  Will check respiratory virus panel.  We will add Zithromax  Generalized deconditioning -PT evaluation  Constipation/abdominal distention -Use laxatives  Chest pain -Atypical.  Troponin negative so far and EKG not suggestive of ACS.  Continue Protonix and antacids as needed  Bipolar disorder/depression -continue Abilify, BuSpar, Lexapro   DVT prophylaxis: Lovenox Code Status: Full Family Communication: None at bedside Disposition Plan: Home in 1 to 2 days once clinically improved  Consultants: None  Procedures: None  Antimicrobials: None   Subjective: Patient  seen and examined at bedside.  he feels slightly better but is still short of breath with minimal exertion.  No overnight fever or vomiting.  No current chest pain.  Objective: Vitals:   01/18/18 0118 01/18/18 0153 01/18/18 0539 01/18/18 0837  BP: (!) 156/89  118/86   Pulse: (!) 124  94   Resp: 17  20   Temp: 98.1 F (36.7 C)  97.8 F (36.6 C)   TempSrc: Oral  Oral   SpO2: 97% 96% 98% 95%  Weight: 79.4 kg     Height:        Intake/Output Summary (Last 24 hours) at 01/18/2018 1117 Last data filed at 01/18/2018 0823 Gross per 24 hour  Intake 1520 ml  Output 725 ml  Net 795 ml   Filed Weights   01/17/18 1443 01/18/18 0118  Weight: 77.3 kg 79.4 kg    Examination:  General exam: Appears calm and comfortable.  Off BiPAP Respiratory system: Bilateral decreased breath sounds at bases, some scattered wheezing and crackles Cardiovascular system: S1 & S2 heard, Rate controlled Gastrointestinal system: Abdomen is nondistended, soft and nontender. Normal bowel sounds heard. Extremities: No cyanosis, clubbing; trace edema    Data Reviewed: I have personally reviewed following labs and imaging studies  CBC: Recent Labs  Lab 01/15/18 1859 01/17/18 1454  WBC 11.1* 9.4  NEUTROABS  --  6.7  HGB 13.0 13.2  HCT 41.0 41.8  MCV 92.3 95.0  PLT 234 716   Basic Metabolic Panel: Recent Labs  Lab 01/15/18 1859 01/17/18 1454  NA 138 139  K 4.5 4.4  CL 97* 101  CO2 28 29  GLUCOSE 79 148*  BUN 15 15  CREATININE 0.87 0.69  CALCIUM 9.1 8.6*   GFR: Estimated Creatinine Clearance: 100.7 mL/min (by C-G formula based on SCr of 0.69 mg/dL). Liver Function Tests: Recent Labs  Lab 01/17/18 1454  AST 22  ALT 22  ALKPHOS 52  BILITOT 0.3  PROT 6.3*  ALBUMIN 4.2   No results for input(s): LIPASE, AMYLASE in the last 168 hours. No results for input(s): AMMONIA in the last 168 hours. Coagulation Profile: No results for input(s): INR, PROTIME in the last 168 hours. Cardiac  Enzymes: Recent Labs  Lab 01/18/18 0144 01/18/18 0805  TROPONINI <0.03 <0.03   BNP (last 3 results) No results for input(s): PROBNP in the last 8760 hours. HbA1C: Recent Labs    01/18/18 0144  HGBA1C 6.1*   CBG: Recent Labs  Lab 01/18/18 0108 01/18/18 0759  GLUCAP 166* 166*   Lipid Profile: No results for input(s): CHOL, HDL, LDLCALC, TRIG, CHOLHDL, LDLDIRECT in the last 72 hours. Thyroid Function Tests: Recent Labs    01/18/18 0144  TSH 0.448   Anemia Panel: No results for input(s): VITAMINB12, FOLATE, FERRITIN, TIBC, IRON, RETICCTPCT in the last 72 hours. Sepsis Labs: No results for input(s): PROCALCITON, LATICACIDVEN in the last 168 hours.  No results found for this or any previous visit (from the past 240 hour(s)).       Radiology Studies: Ct Angio Chest Pe W And/or Wo Contrast  Result Date: 01/17/2018 CLINICAL DATA:  Shortness of breath. EXAM: CT ANGIOGRAPHY CHEST WITH CONTRAST TECHNIQUE: Multidetector CT imaging of the chest was performed using the standard protocol during bolus administration of intravenous contrast. Multiplanar CT image reconstructions and MIPs were obtained to evaluate the vascular anatomy. CONTRAST:  137mL ISOVUE-370 IOPAMIDOL (ISOVUE-370) INJECTION 76% COMPARISON:  Chest x-ray dated 01/17/2018 and CT scan of the chest dated 08/24/2017 FINDINGS: Cardiovascular: Satisfactory opacification of the pulmonary arteries to the segmental level. No evidence of pulmonary embolism. Normal heart size. No pericardial effusion. Minimal aortic atherosclerosis. Mediastinum/Nodes: No enlarged mediastinal, hilar, or axillary lymph nodes. Thyroid gland, trachea, and esophagus demonstrate no significant findings. Lungs/Pleura: Emphysematous changes. No infiltrates or effusions. Upper Abdomen: Negative. Musculoskeletal: No acute abnormalities. Review of the MIP images confirms the above findings. IMPRESSION: No pulmonary emboli or other acute abnormalities.  Emphysema. Electronically Signed   By: Lorriane Shire M.D.   On: 01/17/2018 18:12   Ct Abdomen Pelvis W Contrast  Result Date: 01/17/2018 CLINICAL DATA:  Acute generalized abdominal pain EXAM: CT ABDOMEN AND PELVIS WITH CONTRAST TECHNIQUE: Multidetector CT imaging of the abdomen and pelvis was performed using the standard protocol following bolus administration of intravenous contrast. CONTRAST:  118mL ISOVUE-370 IOPAMIDOL (ISOVUE-370) INJECTION 76% COMPARISON:  08/31/2017 CT FINDINGS: Lower chest: Normal heart size with trace pericardial effusion. No thickening. No acute pulmonary abnormality. Hepatobiliary: No focal liver abnormality is seen. No gallstones, gallbladder wall thickening, or biliary dilatation. Pancreas: Unremarkable. No pancreatic ductal dilatation or surrounding inflammatory changes. Spleen: Normal in size without focal abnormality. Adrenals/Urinary Tract: Adrenal glands are unremarkable. Bilateral renal cysts are identified the largest is 13 mm arising off the posterior interpolar aspect of the right kidney with smaller 11 mm cyst arising off the lower pole of the left kidney. No nephrolithiasis or solid enhancing masses. These appear stable. Bladder is physiologically distended without focal mural thickening and free of calculi. Stomach/Bowel: Large amount of retained stool throughout the colon without inflammation. Left-sided colonic diverticulosis without acute diverticulitis. Patient is status post sigmoid resection by report. Small hiatal hernia. Physiologic  distention of the stomach. Normal small bowel rotation. No small bowel obstruction or inflammation. Vascular/Lymphatic: Moderate aortoiliac and branch vessel atherosclerosis without aneurysm. No adenopathy. Reproductive: Normal size prostate. Seminal vesicles are unremarkable. Other: Status post right inguinal hernia repair with fat containing left inguinal hernia. Musculoskeletal: Degenerative change along the dorsal spine with  Schmorl's nodes at T11 along the superior and inferior endplates and superior endplate of S49. Moderate disc flattening from L2 through L5. No listhesis. IMPRESSION: 1. Increased colonic stool burden without bowel obstruction or inflammation. Left-sided colonic diverticulosis without acute diverticulitis. 2. Stable bilateral renal cysts. 3. Moderate aortoiliac and branch vessel atherosclerosis without aneurysm. 4. Status post right inguinal hernia repair with fat containing left inguinal hernia. Electronically Signed   By: Ashley Royalty M.D.   On: 01/17/2018 18:16   Dg Chest Port 1 View  Result Date: 01/17/2018 CLINICAL DATA:  Patient with increased difficulty breathing. EXAM: PORTABLE CHEST 1 VIEW COMPARISON:  Chest radiograph 01/15/2018 FINDINGS: Monitoring leads overlie the patient. Normal cardiac and mediastinal contours. No consolidative pulmonary opacities. No pleural effusion or pneumothorax. Old right upper rib fractures. IMPRESSION: No acute cardiopulmonary process. Electronically Signed   By: Lovey Newcomer M.D.   On: 01/17/2018 15:41        Scheduled Meds: . ARIPiprazole  5 mg Oral Q breakfast  . busPIRone  10 mg Oral BID  . enoxaparin (LOVENOX) injection  40 mg Subcutaneous Daily  . escitalopram  20 mg Oral Daily  . folic acid  1 mg Oral Daily  . ipratropium  0.5 mg Nebulization Q6H  . levalbuterol  0.63 mg Nebulization Q6H  . methylPREDNISolone (SOLU-MEDROL) injection  60 mg Intravenous Q8H  . pantoprazole  40 mg Oral Daily  . pravastatin  20 mg Oral q1800   Continuous Infusions:   LOS: 0 days        [name], MD [facility] Pager [phone]  If 7PM-7AM, please contact night-coverage [email or web address] Password [email or web address] 01/18/2018, 11:17 AM

## 2018-01-18 NOTE — Progress Notes (Signed)
Nutrition Brief Note  RD consulted via COPD protocol.  No weight loss per weight records. Currently consuming 100% of meals.  Wt Readings from Last 15 Encounters:  01/18/18 79.4 kg  01/02/18 77.3 kg  12/31/17 77.4 kg  12/29/17 78 kg  12/21/17 77.1 kg  11/28/17 76 kg  11/18/17 76.9 kg  11/13/17 77 kg  10/27/17 77.5 kg  10/25/17 74 kg  10/17/17 75.4 kg  10/14/17 70.8 kg  09/28/17 70.5 kg  09/22/17 70.7 kg  09/21/17 70.8 kg    Body mass index is 24.42 kg/m. Patient meets criteria for normal based on current BMI.   Current diet order is , patient is consuming approximately 100% of meals at this time. Labs and medications reviewed.   No nutrition interventions warranted at this time. If nutrition issues arise, please consult RD.   Clayton Bibles, MS, RD, [facility] Pager: [phone] After Hours Pager: [phone]

## 2018-01-19 DIAGNOSIS — J441 Chronic obstructive pulmonary disease with (acute) exacerbation: Secondary | ICD-10-CM | POA: Diagnosis not present

## 2018-01-19 DIAGNOSIS — J9621 Acute and chronic respiratory failure with hypoxia: Secondary | ICD-10-CM | POA: Diagnosis not present

## 2018-01-19 DIAGNOSIS — F319 Bipolar disorder, unspecified: Secondary | ICD-10-CM | POA: Diagnosis not present

## 2018-01-19 DIAGNOSIS — R079 Chest pain, unspecified: Secondary | ICD-10-CM | POA: Diagnosis not present

## 2018-01-19 LAB — CBC WITH DIFFERENTIAL/PLATELET
Abs Immature Granulocytes: 0.25 10*3/uL — ABNORMAL HIGH (ref 0.00–0.07)
Basophils Absolute: 0 10*3/uL (ref 0.0–0.1)
Basophils Relative: 0 %
Eosinophils Absolute: 0 10*3/uL (ref 0.0–0.5)
Eosinophils Relative: 0 %
HCT: 41.1 % (ref 39.0–52.0)
Hemoglobin: 13.1 g/dL (ref 13.0–17.0)
Immature Granulocytes: 2 %
Lymphocytes Relative: 5 %
Lymphs Abs: 0.8 10*3/uL (ref 0.7–4.0)
MCH: 29.5 pg (ref 26.0–34.0)
MCHC: 31.9 g/dL (ref 30.0–36.0)
MCV: 92.6 fL (ref 80.0–100.0)
Monocytes Absolute: 0.6 10*3/uL (ref 0.1–1.0)
Monocytes Relative: 4 %
NEUTROS ABS: 13.6 10*3/uL — AB (ref 1.7–7.7)
NEUTROS PCT: 89 %
Platelets: 237 10*3/uL (ref 150–400)
RBC: 4.44 MIL/uL (ref 4.22–5.81)
RDW: 13.2 % (ref 11.5–15.5)
WBC: 15.3 10*3/uL — ABNORMAL HIGH (ref 4.0–10.5)
nRBC: 0 % (ref 0.0–0.2)

## 2018-01-19 LAB — BASIC METABOLIC PANEL
Anion gap: 13 (ref 5–15)
BUN: 21 mg/dL (ref 8–23)
CALCIUM: 8.7 mg/dL — AB (ref 8.9–10.3)
CO2: 26 mmol/L (ref 22–32)
Chloride: 97 mmol/L — ABNORMAL LOW (ref 98–111)
Creatinine, Ser: 0.83 mg/dL (ref 0.61–1.24)
GFR calc Af Amer: >60 mL/min (ref >60)
Glucose, Bld: 250 mg/dL — ABNORMAL HIGH (ref 70–99)
Potassium: 4.4 mmol/L (ref 3.5–5.1)
Sodium: 136 mmol/L (ref 135–145)

## 2018-01-19 LAB — GLUCOSE, CAPILLARY: Glucose-Capillary: 165 mg/dL — ABNORMAL HIGH (ref 70–99)

## 2018-01-19 LAB — HIV ANTIBODY (ROUTINE TESTING W REFLEX): HIV Screen 4th Generation wRfx: NONREACTIVE

## 2018-01-19 LAB — MAGNESIUM: Magnesium: 2.4 mg/dL (ref 1.7–2.4)

## 2018-01-19 MED ORDER — PANTOPRAZOLE SODIUM 40 MG PO TBEC: 40.0000 mg | DELAYED_RELEASE_TABLET | Freq: Every day | ORAL | 0 refills | Status: DC

## 2018-01-19 MED ORDER — SENNOSIDES-DOCUSATE SODIUM 8.6-50 MG PO TABS: 1.0000 | ORAL_TABLET | Freq: Two times a day (BID) | ORAL | 0 refills | Status: DC

## 2018-01-19 MED ORDER — FOLIC ACID 1 MG PO TABS: 1.0000 mg | ORAL_TABLET | ORAL | Status: DC

## 2018-01-19 MED ORDER — POLYETHYLENE GLYCOL 3350 17 G PO PACK: 17.0000 g | PACK | Freq: Every day | ORAL | 0 refills | Status: DC

## 2018-01-19 MED ORDER — PREDNISONE 20 MG PO TABS: 40.0000 mg | ORAL_TABLET | Freq: Every day | ORAL | 0 refills | Status: AC

## 2018-01-19 MED ORDER — BENZONATATE 100 MG PO CAPS: 100.0000 mg | ORAL_CAPSULE | Freq: Three times a day (TID) | ORAL | 0 refills | Status: DC

## 2018-01-19 NOTE — Discharge Summary (Signed)
Physician Discharge Summary  Taylor [name] QQP:[id number] DOB: [birth year] DOA: 01/17/2018  PCP: Clent Demark, PA-C  Admit date: 01/17/2018 Discharge date: 01/19/2018  Admitted From: Patient is currently living in a motel Disposition: Discharged back to the motel  Recommendations for Outpatient Follow-up:  1. Follow up with PCP in 1 week 2. Outpatient follow-up with pulmonary and cardiology 3. Follow-up in the ED if symptoms worsen or new appear   Home Health: No Equipment/Devices: Oxygen via nasal cannula at 3 L/min Discharge Condition: Stable CODE STATUS: Full Diet recommendation: Heart Healthy / Carb Modified  Brief/Interim Summary: 62 year old male with history of asthma/COPD, chronic hypoxic respiratory failure on 3 L oxygen via nasal cannula at home, bipolar disorder, depression presented on 01/17/2018 for respiratory distress.  He was started on BiPAP and given intravenous Solu-Medrol.  He was admitted for COPD exacerbation.  CT angiogram of the chest showed emphysema with no PE or other acute abnormalities.  CT abdomen showed increased stool burden along with left-sided diverticulosis without diverticulitis.  During the hospitalization, his respite status improved.  He was transitioned back to his home oxygen requirement 3 L/min.  He will be discharged on oral prednisone.  Discharge Diagnoses:  Principal Problem:   Respiratory distress Active Problems:   Depression   Bipolar 1 disorder (HCC)   Chest pain   Abdominal distention   Physical deconditioning   Pre-diabetes  Acute on chronic hypoxic respiratory failure -Probably from COPD exacerbation.  Initially required BiPAP.  Currently on nasal cannula oxygen at 3 L/min which is his baseline.   COPD exacerbation -Treated with Solu-Medrol in the ED.  -No evidence of PE on CT angiogram.   -Much improved.  Discharge home on prednisone 40 mg daily for 7 days.  Continue other home regimen of inhalers.  Outpatient  follow-up with pulmonary   Constipation/abdominal distention -Had bowel movement with laxatives.  Continue laxatives at home.  Chest pain -Atypical.  Troponin negative so far and EKG not suggestive of ACS.  Continue Protonix and antacids as needed.  Outpatient follow-up with cardiology  Bipolar disorder/depression -continue Abilify, BuSpar, Lexapro  Discharge Instructions  Discharge Instructions    Ambulatory referral to Cardiology   Complete by:  As directed    Chest pain   Ambulatory referral to Pulmonology   Complete by:  As directed    COPD exacerbation   Call MD for:  difficulty breathing, headache or visual disturbances   Complete by:  As directed    Call MD for:  extreme fatigue   Complete by:  As directed    Call MD for:  hives   Complete by:  As directed    Call MD for:  persistant dizziness or light-headedness   Complete by:  As directed    Call MD for:  persistant nausea and vomiting   Complete by:  As directed    Call MD for:  severe uncontrolled pain   Complete by:  As directed    Call MD for:  temperature >100.4   Complete by:  As directed    Diet - low sodium heart healthy   Complete by:  As directed    Diet Carb Modified   Complete by:  As directed    Increase activity slowly   Complete by:  As directed      Allergies as of 01/19/2018      Reactions   Codeine Nausea And Vomiting      Medication List    STOP taking these medications  sodium chloride 0.65 % Soln nasal spray Commonly known as:  OCEAN     TAKE these medications   ACCU-CHEK AVIVA device Use as instructed   accu-chek soft touch lancets 1 each by Other route 2 (two) times daily. Use as instructed   albuterol 108 (90 Base) MCG/ACT inhaler Commonly known as:  PROVENTIL HFA;VENTOLIN HFA Inhale 2 puffs into the lungs every 6 (six) hours as needed for wheezing or shortness of breath.   albuterol (2.5 MG/3ML) 0.083% nebulizer solution Commonly known as:  PROVENTIL Take 3 mLs  (2.5 mg total) by nebulization every 4 (four) hours as needed for wheezing or shortness of breath.   ARIPiprazole 5 MG tablet Commonly known as:  ABILIFY Take 1 tablet (5 mg total) by mouth daily with breakfast.   benzonatate 100 MG capsule Commonly known as:  TESSALON Take 1 capsule (100 mg total) by mouth every 8 (eight) hours.   busPIRone 10 MG tablet Commonly known as:  BUSPAR Take 10 mg by mouth 2 (two) times daily.   escitalopram 20 MG tablet Commonly known as:  LEXAPRO Take 1 tablet (20 mg total) by mouth daily.   folic acid 1 MG tablet Commonly known as:  FOLVITE Take 1 tablet (1 mg total) by mouth 3 (three) times a week.   glucose blood test strip Commonly known as:  ACCU-CHEK AVIVA 1 each by Other route as needed for other. Use as instructed   lovastatin 20 MG tablet Commonly known as:  MEVACOR Take 1 tablet (20 mg total) by mouth at bedtime.   metFORMIN 1000 MG tablet Commonly known as:  GLUCOPHAGE Take 1 tablet (1,000 mg total) by mouth 2 (two) times daily with a meal.   oseltamivir 75 MG capsule Commonly known as:  TAMIFLU Take 1 capsule (75 mg total) by mouth every 12 (twelve) hours.   OXYGEN Inhale 3 L into the lungs continuous.   pantoprazole 40 MG tablet Commonly known as:  PROTONIX Take 1 tablet (40 mg total) by mouth daily.   polyethylene glycol packet Commonly known as:  MIRALAX / GLYCOLAX Take 17 g by mouth daily.   predniSONE 20 MG tablet Commonly known as:  DELTASONE Take 2 tablets (40 mg total) by mouth daily for 7 days. What changed:    medication strength  how much to take  how to take this  when to take this  additional instructions   senna-docusate 8.6-50 MG tablet Commonly known as:  Senokot-S Take 1 tablet by mouth 2 (two) times daily.   TRELEGY ELLIPTA 100-62.5-25 MCG/INH Aepb Generic drug:  Fluticasone-Umeclidin-Vilant Inhale 1 puff into the lungs daily.      Follow-up Information    Clent Demark, PA-C.  Schedule an appointment as soon as possible for a visit in 1 week(s).   Specialty:  Physician Assistant Contact information: [place] [address] [phone]          Allergies  Allergen Reactions  . Codeine Nausea And Vomiting    Consultations:  None   Procedures/Studies: Dg Chest 2 View  Result Date: 01/02/2018 CLINICAL DATA:  62 year old male with headache, chills. On home oxygen. EXAM: CHEST - 2 VIEW COMPARISON:  12/21/2017 and earlier. FINDINGS: Chronic large lung volumes. Mediastinal contours remain normal. Visualized tracheal air column is within normal limits. Attenuation of bronchovascular markings bilaterally compatible with emphysema but no pneumothorax, pulmonary edema, pleural effusion or confluent pulmonary opacity. No acute osseous abnormality identified. Chronic lateral right upper rib fractures. Negative visible  bowel gas pattern. IMPRESSION: Chronic Emphysema (ICD10-J43.9). No acute cardiopulmonary abnormality. Electronically Signed   By: Genevie Ann M.D.   On: 01/02/2018 18:18   Dg Chest 2 View  Result Date: 12/21/2017 CLINICAL DATA:  Shortness of breath for 2 days.  Chest pain. EXAM: CHEST - 2 VIEW COMPARISON:  11/28/2017 FINDINGS: Cardiomediastinal silhouette is normal. Mediastinal contours appear intact. Calcific atherosclerotic disease of the aorta. There is no evidence of focal airspace consolidation, pleural effusion or pneumothorax. Osseous structures are without acute abnormality. Healed right-sided rib fractures. Soft tissues are grossly normal. IMPRESSION: No active cardiopulmonary disease. Electronically Signed   By: Fidela Salisbury M.D.   On: 12/21/2017 16:27   Ct Angio Chest Pe W And/or Wo Contrast  Result Date: 01/17/2018 CLINICAL DATA:  Shortness of breath. EXAM: CT ANGIOGRAPHY CHEST WITH CONTRAST TECHNIQUE: Multidetector CT imaging of the chest was performed using the standard protocol during bolus administration of intravenous  contrast. Multiplanar CT image reconstructions and MIPs were obtained to evaluate the vascular anatomy. CONTRAST:  159mL ISOVUE-370 IOPAMIDOL (ISOVUE-370) INJECTION 76% COMPARISON:  Chest x-ray dated 01/17/2018 and CT scan of the chest dated 08/24/2017 FINDINGS: Cardiovascular: Satisfactory opacification of the pulmonary arteries to the segmental level. No evidence of pulmonary embolism. Normal heart size. No pericardial effusion. Minimal aortic atherosclerosis. Mediastinum/Nodes: No enlarged mediastinal, hilar, or axillary lymph nodes. Thyroid gland, trachea, and esophagus demonstrate no significant findings. Lungs/Pleura: Emphysematous changes. No infiltrates or effusions. Upper Abdomen: Negative. Musculoskeletal: No acute abnormalities. Review of the MIP images confirms the above findings. IMPRESSION: No pulmonary emboli or other acute abnormalities. Emphysema. Electronically Signed   By: Lorriane Shire M.D.   On: 01/17/2018 18:12   Ct Abdomen Pelvis W Contrast  Result Date: 01/17/2018 CLINICAL DATA:  Acute generalized abdominal pain EXAM: CT ABDOMEN AND PELVIS WITH CONTRAST TECHNIQUE: Multidetector CT imaging of the abdomen and pelvis was performed using the standard protocol following bolus administration of intravenous contrast. CONTRAST:  163mL ISOVUE-370 IOPAMIDOL (ISOVUE-370) INJECTION 76% COMPARISON:  08/31/2017 CT FINDINGS: Lower chest: Normal heart size with trace pericardial effusion. No thickening. No acute pulmonary abnormality. Hepatobiliary: No focal liver abnormality is seen. No gallstones, gallbladder wall thickening, or biliary dilatation. Pancreas: Unremarkable. No pancreatic ductal dilatation or surrounding inflammatory changes. Spleen: Normal in size without focal abnormality. Adrenals/Urinary Tract: Adrenal glands are unremarkable. Bilateral renal cysts are identified the largest is 13 mm arising off the posterior interpolar aspect of the right kidney with smaller 11 mm cyst arising off  the lower pole of the left kidney. No nephrolithiasis or solid enhancing masses. These appear stable. Bladder is physiologically distended without focal mural thickening and free of calculi. Stomach/Bowel: Large amount of retained stool throughout the colon without inflammation. Left-sided colonic diverticulosis without acute diverticulitis. Patient is status post sigmoid resection by report. Small hiatal hernia. Physiologic distention of the stomach. Normal small bowel rotation. No small bowel obstruction or inflammation. Vascular/Lymphatic: Moderate aortoiliac and branch vessel atherosclerosis without aneurysm. No adenopathy. Reproductive: Normal size prostate. Seminal vesicles are unremarkable. Other: Status post right inguinal hernia repair with fat containing left inguinal hernia. Musculoskeletal: Degenerative change along the dorsal spine with Schmorl's nodes at T11 along the superior and inferior endplates and superior endplate of I33. Moderate disc flattening from L2 through L5. No listhesis. IMPRESSION: 1. Increased colonic stool burden without bowel obstruction or inflammation. Left-sided colonic diverticulosis without acute diverticulitis. 2. Stable bilateral renal cysts. 3. Moderate aortoiliac and branch vessel atherosclerosis without aneurysm. 4. Status post right inguinal hernia repair  with fat containing left inguinal hernia. Electronically Signed   By: Ashley Royalty M.D.   On: 01/17/2018 18:16   Dg Chest Port 1 View  Result Date: 01/17/2018 CLINICAL DATA:  Patient with increased difficulty breathing. EXAM: PORTABLE CHEST 1 VIEW COMPARISON:  Chest radiograph 01/15/2018 FINDINGS: Monitoring leads overlie the patient. Normal cardiac and mediastinal contours. No consolidative pulmonary opacities. No pleural effusion or pneumothorax. Old right upper rib fractures. IMPRESSION: No acute cardiopulmonary process. Electronically Signed   By: Lovey Newcomer M.D.   On: 01/17/2018 15:41   Dg Chest Port 1  View  Result Date: 01/15/2018 CLINICAL DATA:  Chest pain. EXAM: PORTABLE CHEST 1 VIEW COMPARISON:  None. FINDINGS: Heart size is normal. The lungs are clear. Hyperexpansion is stable. Remote right-sided rib fractures are again seen. IMPRESSION: No acute cardiopulmonary disease. Electronically Signed   By: San Morelle M.D.   On: 01/15/2018 19:31    Subjective: Patient seen and examined at bedside.  He feels better.  He had bowel movement.  No worsening shortness of breath.  No current chest pain.  He feels that he would be okay going home today.  Discharge Exam: Vitals:   01/19/18 0422 01/19/18 0858  BP: (!) 147/95   Pulse: 92   Resp: 17   Temp: 98 F (36.7 C)   SpO2: 97% 95%   Vitals:   01/18/18 2109 01/19/18 0226 01/19/18 0422 01/19/18 0858  BP: (!) 144/82  (!) 147/95   Pulse: 92  92   Resp:   17   Temp:   98 F (36.7 C)   TempSrc:   Oral   SpO2: 99% 96% 97% 95%  Weight:      Height:        General: Pt is alert, awake, not in acute distress Cardiovascular: rate controlled, S1/S2 + Respiratory: bilateral decreased breath sounds at bases, not much wheezing Abdominal: Soft, NT, ND, bowel sounds + Extremities: Trace edema, no cyanosis    The results of significant diagnostics from this hospitalization (including imaging, microbiology, ancillary and laboratory) are listed below for reference.     Microbiology: No results found for this or any previous visit (from the past 240 hour(s)).   Labs: BNP (last 3 results) Recent Labs    09/10/17 2215 12/21/17 1551 01/17/18 1454  BNP 94.6 27.5 25.4   Basic Metabolic Panel: Recent Labs  Lab 01/15/18 1859 01/17/18 1454 01/19/18 0522  NA 138 139 136  K 4.5 4.4 4.4  CL 97* 101 97*  CO2 28 29 26   GLUCOSE 79 148* 250*  BUN 15 15 21   CREATININE 0.87 0.69 0.83  CALCIUM 9.1 8.6* 8.7*  MG  --   --  2.4   Liver Function Tests: Recent Labs  Lab 01/17/18 1454  AST 22  ALT 22  ALKPHOS 52  BILITOT 0.3  PROT  6.3*  ALBUMIN 4.2   No results for input(s): LIPASE, AMYLASE in the last 168 hours. No results for input(s): AMMONIA in the last 168 hours. CBC: Recent Labs  Lab 01/15/18 1859 01/17/18 1454 01/19/18 0522  WBC 11.1* 9.4 15.3*  NEUTROABS  --  6.7 13.6*  HGB 13.0 13.2 13.1  HCT 41.0 41.8 41.1  MCV 92.3 95.0 92.6  PLT 234 209 237   Cardiac Enzymes: Recent Labs  Lab 01/18/18 0144 01/18/18 0805 01/18/18 1318  TROPONINI <0.03 <0.03 <0.03   BNP: Invalid input(s): POCBNP CBG: Recent Labs  Lab 01/18/18 0108 01/18/18 0759 01/18/18 2010 01/19/18 [place]  166* 166* 286* 165*   D-Dimer No results for input(s): DDIMER in the last 72 hours. Hgb A1c Recent Labs    01/18/18 0144  HGBA1C 6.1*   Lipid Profile No results for input(s): CHOL, HDL, LDLCALC, TRIG, CHOLHDL, LDLDIRECT in the last 72 hours. Thyroid function studies Recent Labs    01/18/18 0144  TSH 0.448   Anemia work up No results for input(s): VITAMINB12, FOLATE, FERRITIN, TIBC, IRON, RETICCTPCT in the last 72 hours. Urinalysis    Component Value Date/Time   COLORURINE YELLOW 01/17/2018 [place] 01/17/2018 1731   LABSPEC 1.036 (H) 01/17/2018 1731   PHURINE 5.0 01/17/2018 1731   GLUCOSEU >=500 (A) 01/17/2018 1731   HGBUR NEGATIVE 01/17/2018 1731   BILIRUBINUR NEGATIVE 01/17/2018 1731   KETONESUR 5 (A) 01/17/2018 1731   PROTEINUR NEGATIVE 01/17/2018 1731   UROBILINOGEN 1.0 01/09/2013 1843   NITRITE NEGATIVE 01/17/2018 1731   LEUKOCYTESUR NEGATIVE 01/17/2018 1731   Sepsis Labs Invalid input(s): PROCALCITONIN,  WBC,  LACTICIDVEN Microbiology No results found for this or any previous visit (from the past 240 hour(s)).   Time coordinating discharge: 35 minutes  SIGNED:   [name], MD  [facility] 01/19/2018, 12:10 PM Pager: [phone]  If 7PM-7AM, please contact night-coverage [email or web address] Password [email or web address]

## 2018-01-19 NOTE — Progress Notes (Signed)
LCSW consulted for homeless issues.   Patient has funds and is living in a hotel. Patient will dc to hotel with o2. LCSW  Will provide taxi voucher to hotel.   LCSW signing off. No CSW needs.   Taylor [name]

## 2018-01-19 NOTE — Care Management Note (Addendum)
Case Management Note  Patient Details  Name: Taylor [name] MRN: [id number] Date of Birth: [birth year]  Subjective/Objective:                  discharged  Action/Plan: Patient states that he is on 3l/min nasal o2 at home needs travel tank/[facility] it [facility]/karen notified that patient needs travel o2.Aletha Halim GO BACK to motel [address] via [facility] with o2.  Expected Discharge Date:  01/19/18               Expected Discharge Plan:  Home/Self Care  In-House Referral:     Discharge planning Services  CM Consult  Post Acute Care Choice:    Choice offered to:     DME Arranged:    DME Agency:     HH Arranged:    HH Agency:     Status of Service:  Completed, signed off  If discussed at [employer] of Stay Meetings, dates discussed:    Additional Comments:  Leeroy Cha, RN 01/19/2018, 9:16 AM

## 2018-01-27 ENCOUNTER — Ambulatory Visit (INDEPENDENT_AMBULATORY_CARE_PROVIDER_SITE_OTHER): Payer: Self-pay | Admitting: Physician Assistant

## 2018-01-28 ENCOUNTER — Other Ambulatory Visit: Payer: Self-pay | Admitting: Urology

## 2018-01-29 NOTE — Progress Notes (Signed)
Reviewing pt chart for pre-op interview.  Noted pt admitted and discharged from hospital 01-19-2018 in epic.  Pt is on continual oxygen supplement.  Due to [facility] guidelines, pt is not a candidate for [facility].  Called and lvm for pam, or schdeduler for dr Jeffie Pollock to inform her of this and also will need clearance for surgery at main [facility] OR since he was to follow up with pulmonology and cardiology after discharge.

## 2018-02-02 ENCOUNTER — Other Ambulatory Visit: Payer: Self-pay

## 2018-02-02 ENCOUNTER — Encounter (INDEPENDENT_AMBULATORY_CARE_PROVIDER_SITE_OTHER): Payer: Self-pay | Admitting: Internal Medicine

## 2018-02-02 ENCOUNTER — Ambulatory Visit (INDEPENDENT_AMBULATORY_CARE_PROVIDER_SITE_OTHER): Payer: Medicaid Other | Admitting: Internal Medicine

## 2018-02-02 VITALS — BP 119/85 | HR 99 | Temp 98.1°F | Ht 71.0 in | Wt 179.8 lb

## 2018-02-02 DIAGNOSIS — Z01818 Encounter for other preprocedural examination: Secondary | ICD-10-CM

## 2018-02-02 DIAGNOSIS — Z9981 Dependence on supplemental oxygen: Secondary | ICD-10-CM

## 2018-02-02 DIAGNOSIS — C61 Malignant neoplasm of prostate: Secondary | ICD-10-CM | POA: Diagnosis not present

## 2018-02-02 DIAGNOSIS — J449 Chronic obstructive pulmonary disease, unspecified: Secondary | ICD-10-CM | POA: Diagnosis not present

## 2018-02-02 DIAGNOSIS — Z8659 Personal history of other mental and behavioral disorders: Secondary | ICD-10-CM

## 2018-02-02 DIAGNOSIS — E099 Drug or chemical induced diabetes mellitus without complications: Secondary | ICD-10-CM

## 2018-02-02 MED ORDER — FLUTICASONE-UMECLIDIN-VILANT 100-62.5-25 MCG/INH IN AEPB: 1.0000 | INHALATION_SPRAY | Freq: Every day | RESPIRATORY_TRACT | 6 refills | Status: DC

## 2018-02-02 NOTE — Progress Notes (Signed)
Patient ID: Taylor [name], male    DOB: 03-01-1955  MRN: [id number]  CC: Follow-up (DM) and Medical Clearance   Subjective: Taylor [name] is a 63 y.o. male who presents for hospital f/u and pre-op eval His concerns today include:  Patient with history of asthma/COPD, chronic respiratory failure on 3 L of O2, HL, bipolar disorder, depression, prostate cancer, colon CA (last c-scope was 2017 at [facility])  Patient recently hospitalized 12/28-30/2019 with acute on chronic hypoxic respiratory failure secondary to COPD exacerbation.  Patient was placed on BiPAP during admission with improvement in respiratory status.  He was transitioned back to his 3 L of oxygen and discharged on prednisone.  Patient told to follow-up with his pulmonologist. -pt is a poor historian and I had a difficult time figuring out whether he is on inhalers or not.  He apparently was on Dulera and Spiriva refill but had difficulty affording so he was changed to Trelegy by Dr. Melvyn Novas on last visit in early December.   -currently does not have any inhalers at home. Only using albuterol neb treatment 4 x a day.  He thinks Medicaid will now pay for the Trelegy inhaler so he wanted me to send a RF on it to his pharmacy.  On oxygen 3 Lt continuous.  Currently on Prednisone taper now 10 mg 2 daily for past 2-3 days.   Drug induce DM:  Placed on Metformin 500 mg BID.  Stopped taking less than 1 wk ago because he feels he does not need it once he starts weaning off Prednisone.  A1C in 09/2017 was 7.  Most recent A1C was 6.1.  Checks BS but out of stripes and lancets.  Request RF but he does have RFs left on current rxn -no fhx of DM  Hx of Prostate CA: Plan is for gold seeds radiation implants on the 23rd of this month.  We have received a request from [facility] urology for preoperative evaluation as it will be done under anesthesia.  Patient reports no problems with anesthesia in past. On Oxygen for less than 1 yr.  No hx of heart  disease in past.  He denies any chest pains.  However he reports that he gets winded very easily.  He can walk about 50 feet before having to stop and rest.  He thinks he did pulmonary rehab in the past.  Patient also with history of bipolar disorder and depression.  He was on Abilify and Lexapro but states that he stopped taking them over a month ago.  He does not feel that he needs to be on them.  He was being followed by psychiatry at [facility] but states that he does not plan to go back.  He declines referral to see a different psychiatrist   Patient Active Problem List   Diagnosis Date Noted  . Physical deconditioning 01/18/2018  . Pre-diabetes 01/18/2018  . Respiratory distress 01/17/2018  . Abdominal distention 11/28/2017  . Chronic respiratory failure with hypoxia ([place]) 11/19/2017  . Essential hypertension 11/13/2017  . Type 2 diabetes mellitus ([place]) 10/24/2017  . Adjustment disorder with anxiety   . Sexually offensive behavior/Sex Offender (Minor male child) 09/23/2017  . Hypoxic Respiratory failure, acute and chronic ([place]) 09/22/2017  . Palliative care by specialist   . DNR (do not resuscitate)   . Chronic respiratory failure with hypoxia and hypercapnia ([place]) 09/14/2017  . Acute on chronic respiratory failure with hypoxia ([place]) 09/11/2017  . Anxiety and depression   . Prostate  cancer ([place]) 09/04/2017  . Acute respiratory failure with hypercapnia ([place])   . SOB (shortness of breath)   . Malnutrition of moderate degree 06/14/2017  . Hyperglycemia 06/08/2017  . Constipation 06/08/2017  . SIRS (systemic inflammatory response syndrome) ([place]) 05/21/2017  . Tobacco abuse 05/13/2017  . HLD (hyperlipidemia) 05/13/2017  . Leukocytosis 04/06/2017  . Adjustment disorder with depressed mood 03/28/2017  . Normocytic normochromic anemia 03/24/2017  . COPD with acute exacerbation ([place]) 10/22/2016  . Alcohol abuse 01/09/2013  . COPD exacerbation ([place]) 01/09/2013  . Chest pain 01/09/2013   . Smoker 12/16/2012  . Inguinal hernia unilateral, non-recurrent, right 05/01/2012  . Dyspepsia 02/05/2012  . Bipolar 1 disorder ([place]) 02/05/2012  . 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 COPD  GOLD III 02/03/2012  . Thrombocytopenia ([place]) 02/03/2012  . Depression   . Colon cancer, sigmoid 04/08/2011     Current Outpatient Medications on File Prior to Visit  Medication Sig Dispense Refill  . albuterol (PROVENTIL HFA;VENTOLIN HFA) 108 (90 Base) MCG/ACT inhaler Inhale 2 puffs into the lungs every 6 (six) hours as needed for wheezing or shortness of breath. 1 Inhaler 3  . albuterol (PROVENTIL) (2.5 MG/3ML) 0.083% nebulizer solution Take 3 mLs (2.5 mg total) by nebulization every 4 (four) hours as needed for wheezing or shortness of breath. 75 mL 12  . benzonatate (TESSALON) 100 MG capsule Take 1 capsule (100 mg total) by mouth every 8 (eight) hours. 30 capsule 0  . Blood Glucose Monitoring Suppl (ACCU-CHEK AVIVA) device Use as instructed 1 each 0  . folic acid (FOLVITE) 1 MG tablet Take 1 tablet (1 mg total) by mouth 3 (three) times a week.    Marland Kitchen glucose blood (ACCU-CHEK AVIVA) test strip 1 each by Other route as needed for other. Use as instructed 100 each 11  . Lancets (ACCU-CHEK SOFT TOUCH) lancets 1 each by Other route 2 (two) times daily. Use as instructed 100 each 11  . lovastatin (MEVACOR) 20 MG tablet Take 1 tablet (20 mg total) by mouth at bedtime. 30 tablet 5  . metFORMIN (GLUCOPHAGE) 1000 MG tablet Take 1 tablet (1,000 mg total) by mouth 2 (two) times daily with a meal. 180 tablet 3  . OXYGEN Inhale 3 L into the lungs continuous.     . pantoprazole (PROTONIX) 40 MG tablet Take 1 tablet (40 mg total) by mouth daily. 30 tablet 0  . predniSONE (DELTASONE) 10 MG tablet Take 10 mg by mouth daily with breakfast. Take 2 tablets by mouth daily until better then take 1 tablet daily for 5 days then stop    . senna-docusate (SENOKOT-S) 8.6-50 MG tablet Take 1 tablet by mouth 2  (two) times daily. 30 tablet 0  . busPIRone (BUSPAR) 10 MG tablet Take 10 mg by mouth 2 (two) times daily.    . polyethylene glycol (MIRALAX / GLYCOLAX) packet Take 17 g by mouth daily. (Patient not taking: Reported on 02/02/2018) 14 each 0   No current facility-administered medications on file prior to visit.     Allergies  Allergen Reactions  . Codeine Nausea And Vomiting    Social History   Socioeconomic History  . Marital status: Divorced    Spouse name: Not on file  . Number of children: 1  . Years of education: Not on file  . Highest education level: Not on file  Occupational History  . Occupation: disability pending  Social Needs  . Financial resource strain: Not hard at all  . Food insecurity:  Worry: Never true    Inability: Never true  . Transportation needs:    Medical: Yes    Non-medical: Yes  Tobacco Use  . Smoking status: Former Smoker    Packs/day: 0.10    Years: 46.00    Pack years: 4.60    Types: Cigarettes    Last attempt to quit: 05/21/2017    Years since quitting: 0.7  . Smokeless tobacco: Former Systems developer    Types: Chew  Substance and Sexual Activity  . Alcohol use: Not Currently    Comment: h/o use in the past, no h/o heavy use  . Drug use: No  . Sexual activity: Not Currently  Lifestyle  . Physical activity:    Days per week: 0 days    Minutes per session: 0 min  . Stress: Very much  Relationships  . Social connections:    Talks on phone: More than three times a week    Gets together: Never    Attends religious service: Never    Active member of club or organization: No    Attends meetings of clubs or organizations: Never    Relationship status: Divorced  . Intimate partner violence:    Fear of current or ex partner: No    Emotionally abused: No    Physically abused: No    Forced sexual activity: No  Other Topics Concern  . Not on file  Social History Narrative   Lives alone-divorced, disabled, in [place] 1970's   Brother, Jonothan Heberle  and his wife Jackelyn Poling assist   Prior occupation: Clinical biochemist    Family History  Problem Relation Age of Onset  . Heart disease Father   . Lung cancer Maternal Uncle        smoked    Past Surgical History:  Procedure Laterality Date  . COLON SURGERY  04/11/11   Sigmoid colectomy  . COLOSTOMY REVISION  04/11/2011   Procedure: COLON RESECTION SIGMOID;  Surgeon: [name], MD;  Location: [facility];  Service: General;  Laterality: N/A;  low anterior colon resection   . HERNIA REPAIR    . INGUINAL HERNIA REPAIR Right 05/01/2012   Procedure: HERNIA REPAIR INGUINAL ADULT;  Surgeon: [name], MD;  Location: [facility];  Service: General;  Laterality: Right;  . INSERTION OF MESH Right 05/01/2012   Procedure: INSERTION OF MESH;  Surgeon: [name], MD;  Location: [facility];  Service: General;  Laterality: Right;  . PORT-A-CATH REMOVAL Left 05/01/2012   Procedure: REMOVAL Infusion Port;  Surgeon: [name], MD;  Location: [facility];  Service: General;  Laterality: Left;  . PORTACATH PLACEMENT  05/02/2011   Procedure: INSERTION PORT-A-CATH;  Surgeon: [name], MD;  Location: [facility];  Service: General;  Laterality: N/A;  . PROSTATE BIOPSY      ROS: Review of Systems Negative except as stated above PHYSICAL EXAM: BP 119/85 (BP Location: Right Arm, Patient Position: Sitting, Cuff Size: Normal)   Pulse 99   Temp 98.1 F (36.7 C) (Oral)   Ht 5\' 11"  (1.803 m)   Wt 179 lb 12.8 oz (81.6 kg)   SpO2 95%   BMI 25.08 kg/m  O2 level is on 3 L of O2 by nasal canula Physical Exam  General appearance -older Caucasian male in NAD  mental status -patient is alert and oriented.  He is a poor historian Eyes - pupils equal and reactive, extraocular eye movements intact Nose - normal and patent, no erythema, discharge or polyps Mouth -  mucous membranes moist, pharynx normal without lesions Neck -no cervical lymphadenopathy.  No thyroid enlargement. Lymphatics -no axillary lymphadenopathy Chest  -breath sounds markedly decreased bilaterally with poor air entry Heart - normal rate, regular rhythm, normal S1, S2, no murmurs, rubs, clicks or gallops Abdomen -slightly protuberant.  Normal bowel sounds  neurological - cranial nerves II through XII intact, motor and sensory grossly normal bilaterally Extremities -no lower extremity edema.  Lab Results  Component Value Date   HGBA1C 6.1 (H) 01/18/2018   Lab Results  Component Value Date   WBC 15.3 (H) 01/19/2018   HGB 13.1 01/19/2018   HCT 41.1 01/19/2018   MCV 92.6 01/19/2018   PLT 237 01/19/2018     Chemistry      Component Value Date/Time   NA 136 01/19/2018 0522   NA 136 05/12/2012 1018   K 4.4 01/19/2018 0522   K 4.6 05/12/2012 1018   CL 97 (L) 01/19/2018 0522   CL 100 05/12/2012 1018   CO2 26 01/19/2018 0522   CO2 27 05/12/2012 1018   BUN 21 01/19/2018 0522   BUN 11.1 05/12/2012 1018   CREATININE 0.83 01/19/2018 0522   CREATININE 1.0 05/12/2012 1018      Component Value Date/Time   CALCIUM 8.7 (L) 01/19/2018 0522   CALCIUM 9.0 05/12/2012 1018   ALKPHOS 52 01/17/2018 1454   ALKPHOS 100 10/22/2011 0946   AST 22 01/17/2018 1454   AST 38 (H) 10/22/2011 0946   ALT 22 01/17/2018 1454   ALT 29 10/22/2011 0946   BILITOT 0.3 01/17/2018 1454   BILITOT 0.80 10/22/2011 0946      ASSESSMENT AND PLAN: 1. Pre-op evaluation 2. Prostate cancer [facility]) -Patient's most significant medical issue that will affect anesthesia is his severe COPD.  Given that he had recent hospitalization due to exacerbation of COPD, I recommend that we get him again with his pulmonologist prior to his prostate surgery. We have called Dr. Gustavus Bryant office and scheduled him for later this wk  3. COPD, severe ([place]) -We will send prescription to pharmacy for Trelegy inhaler.  Patient advised to call and let me know if it is not covered by his insurance so that we can change him back to Spiriva with Dulera I have given him instructions for tapering off  prednisone.  However the morning of his surgery, I recommend that he takes 10 mg of prednisone Pt with Leukocytosis likely due to Prednisone use.  - Fluticasone-Umeclidin-Vilant (TRELEGY ELLIPTA) 100-62.5-25 MCG/INH AEPB; Inhale 1 puff into the lungs daily.  Dispense: 1 each; Refill: 6  4. Oxygen dependent   5. Drug-induced diabetes mellitus ([place]) Patient advised that he does have refills on his diabetic testing supplies he just needs to request them from his pharmacy.  6. History of bipolar disorder I recommend that we try to get him in with psychiatry after his prostate surgery but patient declines.  He is not taking his Abilify on Lexapro and declines refills.   I will forward a copy of this note to [facility].  Patient was given the opportunity to ask questions.  Patient verbalized understanding of the plan and was able to repeat key elements of the plan.   No orders of the defined types were placed in this encounter.    Requested Prescriptions   Signed Prescriptions Disp Refills  . Fluticasone-Umeclidin-Vilant (TRELEGY ELLIPTA) 100-62.5-25 MCG/INH AEPB 1 each 6    Sig: Inhale 1 puff into the lungs daily.    Return in about 1  month (around 03/05/2018).  Karle Plumber, MD, FACP

## 2018-02-02 NOTE — Patient Instructions (Addendum)
Starting today, decrease prednisone to 1 tablet daily for 3 days then half a tablet daily for 3 days and then stop.  I have sent a refill on the Trelegy inhaler to your pharmacy.  Please fill the prescription and use daily.  We will try to get you in with your pulmonologist Dr. Melvyn Novas prior to your surgery later this month.

## 2018-02-05 ENCOUNTER — Telehealth: Payer: Self-pay

## 2018-02-05 MED ORDER — MOMETASONE FURO-FORMOTEROL FUM 200-5 MCG/ACT IN AERO: 2.0000 | INHALATION_SPRAY | Freq: Two times a day (BID) | RESPIRATORY_TRACT | 6 refills | Status: DC

## 2018-02-05 MED ORDER — TIOTROPIUM BROMIDE MONOHYDRATE 18 MCG IN CAPS: 18.0000 ug | ORAL_CAPSULE | Freq: Every day | RESPIRATORY_TRACT | 12 refills | Status: DC

## 2018-02-05 NOTE — Telephone Encounter (Signed)
I was notified via skype by [name] that this patient was prescribed Trelegy Ellipta which is not covered by the patients insurance. The insurance prefers Advair, Ruthe Mannan, and Symbicort. The patient was given [facility] during his inpatient stay at the end of December, he also used symbicort back in 2014. Can we switch him to a preferred product?

## 2018-02-05 NOTE — Telephone Encounter (Signed)
Trelegy not covered by his insurance.  Will send rxn for Dulera and Spiriva instead.  Pt has appt with pulmonary 02/10/2018

## 2018-02-06 NOTE — Telephone Encounter (Signed)
Patient is aware that since Trelegy is not covered under his insurance his inhalers have been changed back to [facility] and Spiriva. Prescriptions have been sent to his pharmacy. Nat Christen, CMA

## 2018-02-10 ENCOUNTER — Ambulatory Visit (INDEPENDENT_AMBULATORY_CARE_PROVIDER_SITE_OTHER): Payer: Medicaid Other | Admitting: Internal Medicine

## 2018-02-10 ENCOUNTER — Encounter: Payer: Self-pay | Admitting: Internal Medicine

## 2018-02-10 VITALS — BP 140/82 | HR 105 | Ht 71.0 in | Wt 185.2 lb

## 2018-02-10 DIAGNOSIS — J9612 Chronic respiratory failure with hypercapnia: Secondary | ICD-10-CM

## 2018-02-10 DIAGNOSIS — J9611 Chronic respiratory failure with hypoxia: Secondary | ICD-10-CM | POA: Diagnosis not present

## 2018-02-10 DIAGNOSIS — J449 Chronic obstructive pulmonary disease, unspecified: Secondary | ICD-10-CM | POA: Diagnosis not present

## 2018-02-10 MED ORDER — ALBUTEROL SULFATE (2.5 MG/3ML) 0.083% IN NEBU: 2.5000 mg | INHALATION_SOLUTION | RESPIRATORY_TRACT | 12 refills | Status: DC | PRN

## 2018-02-10 MED ORDER — FLUTICASONE-UMECLIDIN-VILANT 100-62.5-25 MCG/INH IN AEPB: 1.0000 | INHALATION_SPRAY | Freq: Every day | RESPIRATORY_TRACT | 1 refills | Status: DC

## 2018-02-10 NOTE — Progress Notes (Signed)
Subjective:    Patient ID: Taylor [name], male    DOB: [birth year] MRN: [id number]    Brief patient profile:  40  yowm quit smoking 2015  sinus congestion with spring just in 2014 took a few clariton referred by ER for sob after evaluation there for ? IHD > ruled out clinically but GOLD IV COPD documented 12/15/2012    History of Present Illness   Previous eval at [facility] for sob Jan 2014 Admit date: 02/03/2012  Discharge date: 02/06/2012  Discharge Diagnoses:  Principal Problem:  *COPD (chronic obstructive pulmonary disease)  Active Problems:  Colon cancer, sigmoid  Thrombocytopenia  Hyperglycemia  Dyspepsia  Bipolar 1 disorder  Depression       11/18/2017  f/u ov/Taylor [name] re: post hosp f/u - re-establish care  Chief Complaint  Patient presents with  . Hospitalization Follow-up    Hospital 11/13/2017, currently on prednisone. Antibiotic was prescribed, patient has not picked up from pharmacy. States his breathing is better since hospital visit. His sugars have been running high with prednisone.   Breo this am   instead of trelegy  - not apparent problem with trelegy not clar why changed to breo ? hosp/insurance restrictions?  - denies bladder problem while on lama ? If hx accurate  02 2lpm 24/7 Still using alb q 6 h despite rx with maint breo  Doe  = MMRC4  = sob if tries to leave home or while getting dressed   rec Prednisone 10 mg take 2 daily with breakfast until better then 1 daily Prilosec Take two of them  30-60 min before first meal of the day and pepcid 20 mg at bedtime until return GERD  Diet  Plan A = Automatic = first thing in am Dulera 200 x 2 and spiriva x 2 then the duelra  200 x 2  At 12 hours later Plan B = Backup Only use your albuterol as a rescue medication   Plan C = Crisis - only use your albuterol nebulizer if you first try Plan B and it fails to help > ok to use the nebulizer up to every 4 hours but if start needing it regularly call for immediate  appointment    12/29/2017  f/u ov/Taylor [name] re:  GOLD IV copd/ 02 dep @ 3lpm / cannot afford anything but nebs and no better p trial of trelegy samples Chief Complaint  Patient presents with  . Follow-up    PFT's done today.  Breathing has been same since "I ran out of everything" out of trelegy and albuterol inhaler approx 2 wks ago. He has been using duonebs approx 4 x per day.   Dyspnea:  MMRC4  = sob if tries to leave home or while getting dressed   Cough: none Sleeping: on side bed flat  SABA use: duoneb qid  02: 3lpm   24/7 rec Ok use duoneb up to 4 x in 24 hours If breathing gets worse:  Prednisone 10 mg x 2 each am until better then 1 daily x 5 days and stop  - consider lama/laba neb next ov if not doing will on duoneb qid    02/10/2018  Pulmonary consultation/Taylor [name] re:  Finishing prednisone from recent flare / just on dulera 200 2bid/ needs clearance for prostate surgery (seeds) Chief Complaint  Patient presents with  . Consult    surgical clearance  Dyspnea:  MMRC3 = can't walk 100 yards even at a slow pace at a flat grade s stopping due to  sob Cough: none now Sleeping: bed is flat / on side / 2 pillow SABA use: duoneb q 4h despite dulera 2 bid with poor hfa noted  02: 2lpm at rest,  3lpm walking    No obvious day to day or daytime variability or assoc excess/ purulent sputum or mucus plugs or hemoptysis or cp or chest tightness, subjective wheeze or overt sinus or hb symptoms.   Sleeping as above on 02  without nocturnal  or early am exacerbation  of respiratory  c/o's or need for noct saba. Also denies any obvious fluctuation of symptoms with weather or environmental changes or other aggravating or alleviating factors except as outlined above   No unusual exposure hx or h/o childhood pna/ asthma or knowledge of premature birth.  Current Allergies, Complete Past Medical History, Past Surgical History, Family History, and Social History were reviewed in [facility] record.  ROS  The following are not active complaints unless bolded Hoarseness, sore throat, dysphagia, dental problems, itching, sneezing,  nasal congestion or discharge of excess mucus or purulent secretions, ear ache,   fever, chills, sweats, unintended wt loss or wt gain, classically pleuritic or exertional cp,  orthopnea pnd or arm/hand swelling  or leg swelling, presyncope, palpitations, abdominal pain, anorexia, nausea, vomiting, diarrhea  or change in bowel habits or change in bladder habits, change in stools or change in urine, dysuria, hematuria,  rash, arthralgias, visual complaints, headache, numbness, weakness or ataxia or problems with walking or coordination,  change in mood or  memory.        Current Meds  Medication Sig  . albuterol (PROVENTIL) (2.5 MG/3ML) 0.083% nebulizer solution Take 3 mLs (2.5 mg total) by nebulization every 4 (four) hours as needed for wheezing or shortness of breath.  . benzonatate (TESSALON) 100 MG capsule Take 1 capsule (100 mg total) by mouth every 8 (eight) hours.  . Blood Glucose Monitoring Suppl (ACCU-CHEK AVIVA) device Use as instructed  . Fluticasone-Umeclidin-Vilant (TRELEGY ELLIPTA) 100-62.5-25 MCG/INH AEPB Inhale 1 puff into the lungs daily.  Marland Kitchen glucose blood (ACCU-CHEK AVIVA) test strip 1 each by Other route as needed for other. Use as instructed  . Lancets (ACCU-CHEK SOFT TOUCH) lancets 1 each by Other route 2 (two) times daily. Use as instructed  . lovastatin (MEVACOR) 20 MG tablet Take 1 tablet (20 mg total) by mouth at bedtime.  . metFORMIN (GLUCOPHAGE) 1000 MG tablet Take 1 tablet (1,000 mg total) by mouth 2 (two) times daily with a meal.  . mometasone-formoterol (DULERA) 200-5 MCG/ACT AERO Inhale 2 puffs into the lungs 2 (two) times daily.  . OXYGEN Inhale 3 L into the lungs continuous.   . polyethylene glycol (MIRALAX / GLYCOLAX) packet Take 17 g by mouth daily.  . predniSONE (DELTASONE) 10 MG tablet Take 10 mg by mouth  daily with breakfast. Take 2 tablets by mouth daily until better then take 1 tablet daily for 5 days then stop  . senna-docusate (SENOKOT-S) 8.6-50 MG tablet Take 1 tablet by mouth 2 (two) times daily.            Objective:   Physical Exam    02/10/2018  11/18/2017     169  06/05/2016       177 06/17/2013       178       02/22/13 186 lb (84.369 kg)  01/25/13 180 lb 9.6 oz (81.92 kg)  01/18/13 156 lb 15.5 oz (71.2 kg)  HEENT:  Top and bottom dentures with nl oropharynx. Nl external ear canals without cough reflex -  Mild bilateral non-specific turbinate edema     NECK :  without JVD/Nodes/TM/ nl carotid upstrokes bilaterally   LUNGS: no acc muscle use,  Mod barrel  contour chest wall with bilateral  Distant bs s audible wheeze and  without cough on insp or exp maneuver and mod  Hyperresonant  to  percussion bilaterally     CV:  RRR  no s3 or murmur or increase in P2, and no edema   ABD:  Mod obese but soft and nontender with pos late insp Hoover's  in the supine position. No bruits or organomegaly appreciated, bowel sounds nl  MS:   Nl gait/  ext warm without deformities, calf tenderness, cyanosis or clubbing No obvious joint restrictions   SKIN: warm and dry without lesions    NEURO:  alert, approp, nl sensorium with  no motor or cerebellar deficits apparent.           I personally reviewed images and agree with radiology impression as follows:   Chest CTa 01/17/18 No pulmonary emboli or other acute abnormalities.      Assessment & Plan:

## 2018-02-10 NOTE — Patient Instructions (Addendum)
Prednisone  10 mg 1 daily x 3 days and then one half daily x 3 days and stop  Start trelegy each am (equivalent of dulera and spiriva)   Only use your albuterol as a rescue medication to be used if you can't catch your breath by resting or doing a relaxed purse lip breathing pattern.  - The less you use it, the better it will work when you need it. - Ok to use up to one vial  every 4 hours if you must but call for immediate appointment if use goes up over your usual need  Keep your previous appt

## 2018-02-11 ENCOUNTER — Encounter: Payer: Self-pay | Admitting: Internal Medicine

## 2018-02-11 NOTE — Assessment & Plan Note (Signed)
Quit smoking 2015  - 09/17/2012  Walked RA x 3 laps @ 185 ft each stopped due to  End of study, no desat - Spirometry 09/17/2012 FEV1  0.80 (21%) ratio 52  - PFT's 12/15/2012  FEV1  0.89 (24%) with Ratio 38 p am saba and DLCO 44% corrects to 68%  - PFTs 12/07/13      FEV1   1.18 (32%)  Ratio 40  And no change p saba DLCO 50 corrects to 68% took neither spiriva nor dulera before pfts - Alpha one screen 12/15/2012 >  MM -   06/05/2016 p extensive coaching HFA effectiveness =    90% from a baseline of 50% (Ti too short) - 06/05/2016 changed spiriva to respimat x one sample  And started daily prednisone  10/17/2017 Three hospital admissions for COPD exacerbation. Not taking dulera or spirivia currently. Can't afford medication. Changed maintenance inhaler to Trelegy to enhance compliance and given patient assistance  - PFT's  12/29/2017  FEV1 0.58 (16 % ) ratio 38  p 7 % improvement from saba p nothing prior to study with DLCO  32/25c % corrects to 61 % for alv volume with classic copd curvature  - 02/10/2018  After extensive coaching inhaler device,  effectiveness =    90% with elipta so rec trelegy q day    Severe copd with frreq exac so  Group D in terms of symptom/risk and laba/lama/ICS  therefore appropriate rx at this point.  He is way over using saba at baseline  I spent extra time with pt today reviewing appropriate use of albuterol for prn use on exertion with the following points: 1) saba is for relief of sob that does not improve by walking a slower pace or resting but rather if the pt does not improve after trying this first. 2) If the pt is convinced, as many are, that saba helps recover from activity faster then it's easy to tell if this is the case by re-challenging : ie stop, take the inhaler, then p 5 minutes try the exact same activity (intensity of workload) that just caused the symptoms and see if they are substantially diminished or not after saba 3) if there is an activity that  reproducibly causes the symptoms, try the saba 15 min before the activity on alternate days   If in fact the saba really does help, then fine to continue to use it prn but advised may need to look closer at the maintenance regimen being used to achieve better control of airways disease with exertion.    There is no red line in COPD or chronic respiratory failure where I would say his risk is prohibitive for this type of prostate surgery unless he is having an acute exacerbation so I cleared him for surgery today though there certainly is an increased risk of postop pulmonary complications and if the surgery were purely elective and for instance cosmetic I would not recommend surgery at all but take this into account for all future considerations.  Discussed in detail all the  indications, usual  risks and alternatives  relative to the benefits with patient who agrees to proceed with rx as outlined.

## 2018-02-11 NOTE — Assessment & Plan Note (Addendum)
HCO3  12/21/17  = 35  HC03   01/19/18 =26 so hypercarbic component has likely resolved   - 02/10/2018   Walked on 3lpm cont 3 laps @  approx 272ft each @ slow pace  Stopped p each lap  due to  Sob but no desats.  Adequate control on present rx, reviewed in detail with pt > no change in rx needed  / would be good candidate for rehab if willing to go.   I had an extended discussion with the patient  reviewing all relevant studies completed to date and  Lasting 25 minute of a 40 min consultation  which included directly observing ambulatory 02 saturation study documented in a/p section of  today's  office note.  Each maintenance medication was reviewed in detail including most importantly the difference between maintenance and prns and under what circumstances the prns are to be triggered using an action plan format that is not reflected in the computer generated alphabetically organized AVS.    See device teaching which also extended face to face time for this visit   Please see AVS for specific instructions unique to this visit that I personally wrote and verbalized to the the pt in detail and then reviewed with pt  by my nurse highlighting any changes in therapy recommended at today's visit .

## 2018-02-12 ENCOUNTER — Emergency Department (HOSPITAL_COMMUNITY): Payer: Medicaid Other

## 2018-02-12 ENCOUNTER — Emergency Department (HOSPITAL_COMMUNITY)
Admission: EM | Admit: 2018-02-12 | Discharge: 2018-02-13 | Disposition: A | Discharge status: Home / Self Care | Payer: Medicaid Other | Attending: Emergency Medicine | Admitting: Emergency Medicine

## 2018-02-12 ENCOUNTER — Other Ambulatory Visit: Payer: Self-pay

## 2018-02-12 ENCOUNTER — Ambulatory Visit: Payer: Medicaid Other | Admitting: Radiation Oncology

## 2018-02-12 DIAGNOSIS — Z7984 Long term (current) use of oral hypoglycemic drugs: Secondary | ICD-10-CM | POA: Insufficient documentation

## 2018-02-12 DIAGNOSIS — Z85038 Personal history of other malignant neoplasm of large intestine: Secondary | ICD-10-CM | POA: Insufficient documentation

## 2018-02-12 DIAGNOSIS — R0602 Shortness of breath: Secondary | ICD-10-CM | POA: Diagnosis present

## 2018-02-12 DIAGNOSIS — M7918 Myalgia, other site: Secondary | ICD-10-CM | POA: Insufficient documentation

## 2018-02-12 DIAGNOSIS — J441 Chronic obstructive pulmonary disease with (acute) exacerbation: Secondary | ICD-10-CM

## 2018-02-12 DIAGNOSIS — Z9981 Dependence on supplemental oxygen: Secondary | ICD-10-CM | POA: Diagnosis not present

## 2018-02-12 DIAGNOSIS — Z87891 Personal history of nicotine dependence: Secondary | ICD-10-CM | POA: Diagnosis not present

## 2018-02-12 DIAGNOSIS — Z8546 Personal history of malignant neoplasm of prostate: Secondary | ICD-10-CM | POA: Diagnosis not present

## 2018-02-12 DIAGNOSIS — Z79899 Other long term (current) drug therapy: Secondary | ICD-10-CM | POA: Insufficient documentation

## 2018-02-12 DIAGNOSIS — E119 Type 2 diabetes mellitus without complications: Secondary | ICD-10-CM | POA: Diagnosis not present

## 2018-02-12 DIAGNOSIS — B9689 Other specified bacterial agents as the cause of diseases classified elsewhere: Secondary | ICD-10-CM

## 2018-02-12 DIAGNOSIS — R079 Chest pain, unspecified: Secondary | ICD-10-CM | POA: Insufficient documentation

## 2018-02-12 DIAGNOSIS — J019 Acute sinusitis, unspecified: Secondary | ICD-10-CM | POA: Diagnosis not present

## 2018-02-12 LAB — CBC WITH DIFFERENTIAL/PLATELET
Abs Immature Granulocytes: 0.5 10*3/uL — ABNORMAL HIGH (ref 0.00–0.07)
Basophils Absolute: 0.1 10*3/uL (ref 0.0–0.1)
Basophils Relative: 1 %
Eosinophils Absolute: 0.2 10*3/uL (ref 0.0–0.5)
Eosinophils Relative: 2 %
HCT: 41.4 % (ref 39.0–52.0)
Hemoglobin: 13.2 g/dL (ref 13.0–17.0)
LYMPHS PCT: 19 %
Lymphs Abs: 1.5 10*3/uL (ref 0.7–4.0)
MCH: 29.7 pg (ref 26.0–34.0)
MCHC: 31.9 g/dL (ref 30.0–36.0)
MCV: 93.2 fL (ref 80.0–100.0)
MYELOCYTES: 4 %
Monocytes Absolute: 0.2 10*3/uL (ref 0.1–1.0)
Monocytes Relative: 3 %
NRBC: 0 /100{WBCs}
Neutro Abs: 5.5 10*3/uL (ref 1.7–7.7)
Neutrophils Relative %: 69 %
PLATELETS: 227 10*3/uL (ref 150–400)
Promyelocytes Relative: 2 %
RBC: 4.44 MIL/uL (ref 4.22–5.81)
RDW: 13.4 % (ref 11.5–15.5)
WBC: 8 10*3/uL (ref 4.0–10.5)
nRBC: 0 % (ref 0.0–0.2)

## 2018-02-12 LAB — CBG MONITORING, ED: Glucose-Capillary: 169 mg/dL — ABNORMAL HIGH (ref 70–99)

## 2018-02-12 LAB — POCT I-STAT EG7
Acid-Base Excess: 9 mmol/L — ABNORMAL HIGH (ref 0.0–2.0)
Bicarbonate: 37.4 mmol/L — ABNORMAL HIGH (ref 20.0–28.0)
Calcium, Ion: 1.19 mmol/L (ref 1.15–1.40)
HCT: 40 % (ref 39.0–52.0)
Hemoglobin: 13.6 g/dL (ref 13.0–17.0)
O2 Saturation: 64 %
Potassium: 4.4 mmol/L (ref 3.5–5.1)
Sodium: 137 mmol/L (ref 135–145)
TCO2: 39 mmol/L — ABNORMAL HIGH (ref 22–32)
pCO2, Ven: 67.5 mmHg — ABNORMAL HIGH (ref 44.0–60.0)
pH, Ven: 7.351 (ref 7.250–7.430)
pO2, Ven: 36 mmHg (ref 32.0–45.0)

## 2018-02-12 LAB — BASIC METABOLIC PANEL
Anion gap: 10 (ref 5–15)
BUN: 17 mg/dL (ref 8–23)
CO2: 33 mmol/L — ABNORMAL HIGH (ref 22–32)
Calcium: 9.3 mg/dL (ref 8.9–10.3)
Chloride: 95 mmol/L — ABNORMAL LOW (ref 98–111)
Creatinine, Ser: 0.84 mg/dL (ref 0.61–1.24)
GFR calc Af Amer: >60 mL/min (ref >60)
Glucose, Bld: 157 mg/dL — ABNORMAL HIGH (ref 70–99)
Potassium: 4.4 mmol/L (ref 3.5–5.1)
Sodium: 138 mmol/L (ref 135–145)

## 2018-02-12 LAB — TROPONIN I: Troponin I: <0.03 ng/mL (ref <0.03)

## 2018-02-12 LAB — I-STAT TROPONIN, ED: Troponin i, poc: 0 ng/mL (ref 0.00–0.08)

## 2018-02-12 MED ORDER — AMOXICILLIN-POT CLAVULANATE 875-125 MG PO TABS: 1.0000 | ORAL_TABLET | Freq: Two times a day (BID) | ORAL | 0 refills | Status: DC

## 2018-02-12 NOTE — ED Provider Notes (Signed)
[facility] EMERGENCY DEPARTMENT Provider Note   CSN: [id number] Arrival date & time: 02/12/18  1713     History   Chief Complaint Chief Complaint  Patient presents with  . Shortness of Breath  . Chest Pain    HPI Taylor [name] is a 63 y.o. male with a past medical history of COPD, on 3 L of oxygen at baseline by nasal cannula, prostate cancer, colon cancer status post radiation therapy 5 years ago presents to ED for 1 week history of generalized body aches, chills, sore throat, sinus pain and pressure.  States that he had worsening of his wheezing and shortness of breath today which was not improved with his home nebulizer treatments.  He also reports chest pressure.  States that he felt similarly on 01/15/2018 when he presented to the ED and was diagnosed with flulike illness.  He completed his course of steroids and Tamiflu.  States that he felt better up until 1 week ago.  He did receive his influenza vaccine this year.  He was given 10 mg of albuterol, 1 mg of Atrovent, 125 mg of Solu-Medrol and aspirin prior to arrival by EMS.  States that the chest pressure has resolved after these treatments.  He is still concerned about his generalized weakness, myalgias, sinus pressure and chills.  HPI  Past Medical History:  Diagnosis Date  . Asthma   . Bipolar 1 disorder ([place]) 02/05/2012  . Colon cancer ([place]) 04/11/11   adenocarcinoma of colon, 7/19 nodes pos.FINISHED CHEMO/DR. SHERRILL  . COPD (chronic obstructive pulmonary disease) ([place])    SMOKER  . Depression   . Emphysema of lung ([place])   . Full dentures   . Hemorrhoids   . On home oxygen therapy    "3L; 24/7" (05/29/2017)  . Oxygen deficiency   . Pneumonia ~ 2016   "double pneumonia"  . Prostate cancer ([place])    Gleason score = 7, supposed to have radiation therapy but he has not followed up (05/29/2017)  . Rib fractures    hx of    Patient Active Problem List   Diagnosis Date Noted  . Physical  deconditioning 01/18/2018  . Respiratory distress 01/17/2018  . Abdominal distention 11/28/2017  . Chronic respiratory failure with hypoxia ([place]) 11/19/2017  . Essential hypertension 11/13/2017  . Type 2 diabetes mellitus ([place]) 10/24/2017  . Adjustment disorder with anxiety   . Sexually offensive behavior/Sex Offender ([place] male child) 09/23/2017  . Hypoxic Respiratory failure, acute and chronic ([place]) 09/22/2017  . Palliative care by specialist   . DNR (do not resuscitate)   . Chronic respiratory failure with hypoxia and hypercapnia ([place]) 09/14/2017  . Acute on chronic respiratory failure with hypoxia ([place]) 09/11/2017  . Anxiety and depression   . Prostate cancer ([place]) 09/04/2017  . Acute respiratory failure with hypercapnia ([place])   . SOB (shortness of breath)   . Malnutrition of moderate degree 06/14/2017  . Hyperglycemia 06/08/2017  . Constipation 06/08/2017  . SIRS (systemic inflammatory response syndrome) ([place]) 05/21/2017  . Tobacco abuse 05/13/2017  . HLD (hyperlipidemia) 05/13/2017  . Leukocytosis 04/06/2017  . Adjustment disorder with depressed mood 03/28/2017  . Normocytic normochromic anemia 03/24/2017  . COPD with acute exacerbation ([place]) 10/22/2016  . Alcohol abuse 01/09/2013  . COPD exacerbation ([place]) 01/09/2013  . Chest pain 01/09/2013  . Smoker 12/16/2012  . Inguinal hernia unilateral, non-recurrent, right 05/01/2012  . Dyspepsia 02/05/2012  . Bipolar 1 disorder ([place]) 02/05/2012  . 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 COPD  GOLD III 02/03/2012  . Thrombocytopenia ([place]) 02/03/2012  . Depression   . 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 COLON SURGERY  04/11/11   Sigmoid colectomy  . COLOSTOMY REVISION  04/11/2011   Procedure: COLON RESECTION SIGMOID;  Surgeon: [name], MD;  Location: [facility];  Service: General;  Laterality: N/A;  low anterior colon resection   . HERNIA REPAIR    .  INGUINAL HERNIA REPAIR Right 05/01/2012   Procedure: HERNIA REPAIR INGUINAL ADULT;  Surgeon: [name], MD;  Location: [facility];  Service: General;  Laterality: Right;  . INSERTION OF MESH Right 05/01/2012   Procedure: INSERTION OF MESH;  Surgeon: [name], MD;  Location: [facility];  Service: General;  Laterality: Right;  . PORT-A-CATH REMOVAL Left 05/01/2012   Procedure: REMOVAL Infusion Port;  Surgeon: [name], MD;  Location: [facility];  Service: General;  Laterality: Left;  . PORTACATH PLACEMENT  05/02/2011   Procedure: INSERTION PORT-A-CATH;  Surgeon: [name], MD;  Location: [facility];  Service: General;  Laterality: N/A;  . PROSTATE BIOPSY          Home Medications    Prior to Admission medications   Medication Sig Start Date End Date Taking? Authorizing Provider  albuterol (PROVENTIL) (2.5 MG/3ML) 0.083% nebulizer solution Take 3 mLs (2.5 mg total) by nebulization every 4 (four) hours as needed for wheezing or shortness of breath (if you can't catch your breath). 02/10/18   [name], MD  amoxicillin-clavulanate (AUGMENTIN) 875-125 MG tablet Take 1 tablet by mouth every 12 (twelve) hours. 02/12/18   Ladarion Munyon, PA-C  benzonatate (TESSALON) 100 MG capsule Take 1 capsule (100 mg total) by mouth every 8 (eight) hours. 01/19/18   [name], MD  Blood Glucose Monitoring Suppl (ACCU-CHEK AVIVA) device Use as instructed 10/27/17 10/27/18  Clent Demark, PA-C  busPIRone (BUSPAR) 10 MG tablet Take 10 mg by mouth 2 (two) times daily.    [provider]  Fluticasone-Umeclidin-Vilant (TRELEGY ELLIPTA) 100-62.5-25 MCG/INH AEPB Inhale 1 puff into the lungs daily. 02/02/18   Ladell Pier, MD  folic acid (FOLVITE) 1 MG tablet Take 1 tablet (1 mg total) by mouth 3 (three) times a week. Patient not taking: Reported on 02/10/2018 01/19/18   [name], MD  glucose blood (ACCU-CHEK AVIVA) test strip 1 each by Other route as needed for other. Use as instructed 10/27/17    Clent Demark, PA-C  Lancets [facility]) lancets 1 each by Other route 2 (two) times daily. Use as instructed 10/27/17   Clent Demark, PA-C  lovastatin (MEVACOR) 20 MG tablet Take 1 tablet (20 mg total) by mouth at bedtime. 10/27/17   Clent Demark, PA-C  metFORMIN (GLUCOPHAGE) 1000 MG tablet Take 1 tablet (1,000 mg total) by mouth 2 (two) times daily with a meal. 10/27/17   Clent Demark, PA-C  OXYGEN Inhale 3 L into the lungs continuous.     [provider]  pantoprazole (PROTONIX) 40 MG tablet Take 1 tablet (40 mg total) by mouth daily. Patient not taking: Reported on 02/10/2018 01/19/18   [name], MD  polyethylene glycol (MIRALAX / GLYCOLAX) packet Take 17 g by mouth daily. 01/19/18   [name], MD  predniSONE (DELTASONE) 10 MG tablet Take 10 mg by mouth daily with breakfast. Take 2 tablets by mouth daily until better then take 1 tablet  daily for 5 days then stop    [provider]  senna-docusate (SENOKOT-S) 8.6-50 MG tablet Take 1 tablet by mouth 2 (two) times daily. 01/19/18   [name], MD    Family History Family History  Problem Relation Age of Onset  . Heart disease Father   . Lung cancer Maternal Uncle        smoked    Social History Social History   Tobacco Use  . Smoking status: Former Smoker    Packs/day: 0.10    Years: 46.00    Pack years: 4.60    Types: Cigarettes    Last attempt to quit: 05/21/2017    Years since quitting: 0.7  . Smokeless tobacco: Former Systems developer    Types: Chew  Substance Use Topics  . Alcohol use: Not Currently    Comment: h/o use in the past, no h/o heavy use  . Drug use: No     Allergies   Codeine   Review of Systems Review of Systems  Constitutional: Positive for chills. Negative for appetite change and fever.  HENT: Positive for congestion, sinus pressure, sinus pain and sore throat. Negative for ear pain, rhinorrhea and sneezing.   Eyes: Negative for photophobia and  visual disturbance.  Respiratory: Positive for shortness of breath and wheezing. Negative for cough and chest tightness.   Cardiovascular: Positive for chest pain. Negative for palpitations.  Gastrointestinal: Negative for abdominal pain, blood in stool, constipation, diarrhea, nausea and vomiting.  Genitourinary: Negative for dysuria, hematuria and urgency.  Musculoskeletal: Negative for myalgias.  Skin: Negative for rash.  Neurological: Negative for dizziness, weakness and light-headedness.     Physical Exam Updated Vital Signs BP 123/85   Pulse 87   Temp 98.2 F (36.8 C) (Oral)   Resp (!) 23   Wt 84 kg   SpO2 100%   BMI 25.83 kg/m   Physical Exam Vitals signs and nursing note reviewed.  Constitutional:      General: He is not in acute distress.    Appearance: He is well-developed.  HENT:     Head: Normocephalic and atraumatic.     Nose: Congestion present.     Right Sinus: Maxillary sinus tenderness and frontal sinus tenderness present.     Left Sinus: Maxillary sinus tenderness and frontal sinus tenderness present.     Mouth/Throat:     Pharynx: Oropharynx is clear. Uvula midline.     Tonsils: Swelling: 0 on the right. 0 on the left.     Comments: Patient does not appear to be in acute distress. No trismus or drooling present. No pooling of secretions. Patient is tolerating secretions and is not in respiratory distress. No neck pain or tenderness to palpation of the neck. Full active and passive range of motion of the neck. No evidence of RPA or PTA. Eyes:     General: No scleral icterus.       Left eye: No discharge.     Conjunctiva/sclera: Conjunctivae normal.  Neck:     Musculoskeletal: Normal range of motion and neck supple.  Cardiovascular:     Rate and Rhythm: Regular rhythm. Tachycardia present.     Heart sounds: Normal heart sounds. No murmur. No friction rub. No gallop.   Pulmonary:     Effort: Pulmonary effort is normal. No respiratory distress.      Breath sounds: Normal breath sounds.  Abdominal:     General: Bowel sounds are normal. There is no distension.     Palpations: Abdomen  is soft.     Tenderness: There is no abdominal tenderness. There is no guarding.  Musculoskeletal: Normal range of motion.  Skin:    General: Skin is warm and dry.     Findings: No rash.  Neurological:     Mental Status: He is alert.     Motor: No abnormal muscle tone.     Coordination: Coordination normal.      ED Treatments / Results  Labs (all labs ordered are listed, but only abnormal results are displayed) Labs Reviewed  CBC WITH DIFFERENTIAL/PLATELET - Abnormal; Notable for the following components:      Result Value   Abs Immature Granulocytes 0.50 (*)    All other components within normal limits  BASIC METABOLIC PANEL - Abnormal; Notable for the following components:   Chloride 95 (*)    CO2 33 (*)    Glucose, Bld 157 (*)    All other components within normal limits  CBG MONITORING, ED - Abnormal; Notable for the following components:   Glucose-Capillary 169 (*)    All other components within normal limits  POCT I-STAT EG7 - Abnormal; Notable for the following components:   pCO2, Ven 67.5 (*)    Bicarbonate 37.4 (*)    TCO2 39 (*)    Acid-Base Excess 9.0 (*)    All other components within normal limits  TROPONIN I  PATHOLOGIST SMEAR REVIEW  I-STAT TROPONIN, ED  I-STAT VENOUS BLOOD GAS, ED    EKG None  Radiology Dg Chest 2 View  Result Date: 02/12/2018 CLINICAL DATA:  Shortness of breath, chest pain EXAM: CHEST - 2 VIEW COMPARISON:  01/17/2018 FINDINGS: Hyperinflation with emphysematous disease. No acute airspace disease or effusion. Normal heart size. No pneumothorax. Multiple old right rib fractures. IMPRESSION: No active cardiopulmonary disease. Hyperinflation with emphysematous disease. Electronically Signed   By: Donavan Foil M.D.   On: 02/12/2018 18:49    Procedures Procedures (including critical care  time)  Medications Ordered in ED Medications - No data to display   Initial Impression / Assessment and Plan / ED Course  I have reviewed the triage vital signs and the nursing notes.  Pertinent labs & imaging results that were available during my care of the patient were reviewed by me and considered in my medical decision making (see chart for details).     63 year old male presents to ED for 1 week history of generalized body aches, chills, sinus pain and pressure, shortness of breath and wheezing.  Patient is on 3 L of oxygen by nasal cannula at baseline at all times.  He has history of prostate cancer and is scheduled for procedure early next month for seed implants.  Treated for colon cancer about 5 years ago.  He was given 10 mg of albuterol, 1 mg of Atrovent, 125 of Solu-Medrol and aspirin by EMS.  He states that his chest pressure has resolved after these medications.  On my exam patient is speaking complete sentences without difficulty.  Lungs are clear to auscultation bilaterally.  He is satting at 100% on his 3 L of oxygen. Posterior oropharynx is widely patent with post nasal drainage seen, so RPA or PTA.  There is maxillary and frontal sinus tenderness to palpation.  His vital signs are within normal limits.  Troponin is unremarkable. VBG, CBC, BMP and CBG normal here. EKG with no changes from prior.  Chest x-ray shows chronic emphysematous changes with no acute findings.  Will discharge home with Augmentin for sinusitis.  Will  encourage him to use albuterol as needed and to continue his home prednisone. He remains in NAD with no signs of respiratory distress. Patient discussed with my attending, Dr. Alvino Chapel.  Patient is hemodynamically stable, in NAD, and able to ambulate in the ED. Evaluation does not show pathology that would require ongoing emergent intervention or inpatient treatment. I explained the diagnosis to the patient. Pain has been managed and has no complaints prior to  discharge. Patient is comfortable with above plan and is stable for discharge at this time. All questions were answered prior to disposition. Strict return precautions for returning to the ED were discussed. Encouraged follow up with PCP.    Portions of this note were generated with Lobbyist. Dictation errors may occur despite best attempts at proofreading.   Final Clinical Impressions(s) / ED Diagnoses   Final diagnoses:  COPD exacerbation ([place])  Acute bacterial sinusitis    ED Discharge Orders         Ordered    amoxicillin-clavulanate (AUGMENTIN) 875-125 MG tablet  Every 12 hours     02/12/18 2146           Delia Heady, PA-C 02/12/18 2149    Davonna Belling, MD 02/12/18 [phone]

## 2018-02-12 NOTE — ED Triage Notes (Signed)
Pt in from home with c/o sob and chest pressure x 3 days. Hx of COPD, EMS states pt was tripoding on their arrival, wheezing bilaterally. Given 10mg  Albuterol, 1mg  Atrovent, 125mg  Solumderol, 324mg  ASA PTA. States the chest pressure resolved after breathing treatments. Sats 100% on 2L, denies cp, n/v

## 2018-02-12 NOTE — Discharge Instructions (Signed)
Take the antibiotics to help with your sinusitis. Continue your albuterol inhaler and nebulizer treatments as needed. You will need to follow-up with your primary care provider, pulmonologist. Return to ED for worsening symptoms, increasing shortness of breath, chest pain, leg swelling, vomiting or coughing up blood.

## 2018-02-13 ENCOUNTER — Other Ambulatory Visit: Payer: Self-pay | Admitting: Urology

## 2018-02-13 DIAGNOSIS — C61 Malignant neoplasm of prostate: Secondary | ICD-10-CM

## 2018-02-13 LAB — PATHOLOGIST SMEAR REVIEW

## 2018-02-18 NOTE — Patient Instructions (Addendum)
Taylor [name]  02/18/2018   Your procedure is scheduled on: 02-24-18    Report to [facility] Main  Entrance    Report to Admitting at 5:30 AM    Call this number if you have problems the morning of surgery [phone]    Remember: Do not eat food or drink liquids :After Midnight.    BRUSH YOUR TEETH MORNING OF SURGERY AND RINSE YOUR MOUTH OUT, NO CHEWING GUM CANDY OR MINTS.     Take these medicines the morning of surgery with A SIP OF WATER: Aripiprazole (Ablify), Escitalopram (Lexapro). You may also use and bring your inhaler as needed.   DO NOT TAKE ANY DIABETIC MEDICATIONS DAY OF YOUR SURGERY                               You may not have any metal on your body including hair pins and              piercings  Do not wear jewelry, cologne, lotions, powders or deodorant             Men may shave face and neck.   Do not bring valuables to the hospital. [place].  Contacts, dentures or bridgework may not be worn into surgery.      Patients discharged the day of surgery will not be allowed to drive home. IF YOU ARE HAVING SURGERY AND GOING HOME THE SAME DAY, YOU MUST HAVE AN ADULT TO DRIVE YOU HOME AND BE WITH YOU FOR 24 HOURS. YOU MAY GO HOME BY TAXI OR UBER OR ORTHERWISE, BUT AN ADULT MUST ACCOMPANY YOU HOME AND STAY WITH YOU FOR 24 HOURS.    Name and phone number of your driver: Unknown at this time.  Special Instructions: N/A              Please read over the following fact sheets you were given: _____________________________________________________________________             How to Manage Your Diabetes Before and After Surgery  Why is it important to control my blood sugar before and after surgery? . Improving blood sugar levels before and after surgery helps healing and can limit problems. . A way of improving blood sugar control is eating a healthy diet by: o  Eating less sugar and  carbohydrates o  Increasing activity/exercise o  Talking with your doctor about reaching your blood sugar goals . High blood sugars (greater than 180 mg/dL) can raise your risk of infections and slow your recovery, so you will need to focus on controlling your diabetes during the weeks before surgery. . Make sure that the doctor who takes care of your diabetes knows about your planned surgery including the date and location.  How do I manage my blood sugar before surgery? . Check your blood sugar at least 4 times a day, starting 2 days before surgery, to make sure that the level is not too high or low. o Check your blood sugar the morning of your surgery when you wake up and every 2 hours until you get to the Short Stay unit. . If your blood sugar is less than 70 mg/dL, you will need to treat for low blood sugar:  o Do not take insulin. o Treat a low blood sugar (less than 70 mg/dL) with  cup of clear juice (cranberry or apple), 4 glucose tablets, OR glucose gel. o Recheck blood sugar in 15 minutes after treatment (to make sure it is greater than 70 mg/dL). If your blood sugar is not greater than 70 mg/dL on recheck, call [phone] for further instructions. . Report your blood sugar to the short stay nurse when you get to Short Stay.  . If you are admitted to the hospital after surgery: o Your blood sugar will be checked by the staff and you will probably be given insulin after surgery (instead of oral diabetes medicines) to make sure you have good blood sugar levels. o The goal for blood sugar control after surgery is 80-180 mg/dL.   WHAT DO I DO ABOUT MY DIABETES MEDICATION?  Marland Kitchen Do not take oral diabetes medicines (pills) the morning of surgery.  . THE DAY BEFORE SURGERY, take your usual dose of Metformin         [facility] - Preparing for Surgery Before surgery, you can play an important role.  Because skin is not sterile, your skin needs to be as free of germs as possible.  You  can reduce the number of germs on your skin by washing with CHG (chlorahexidine gluconate) soap before surgery.  CHG is an antiseptic cleaner which kills germs and bonds with the skin to continue killing germs even after washing. Please DO NOT use if you have an allergy to CHG or antibacterial soaps.  If your skin becomes reddened/irritated stop using the CHG and inform your nurse when you arrive at Short Stay. Do not shave (including legs and underarms) for at least 48 hours prior to the first CHG shower.  You may shave your face/neck. Please follow these instructions carefully:  1.  Shower with CHG Soap the night before surgery and the  morning of Surgery.  2.  If you choose to wash your hair, wash your hair first as usual with your  normal  shampoo.  3.  After you shampoo, rinse your hair and body thoroughly to remove the  shampoo.                           4.  Use CHG as you would any other liquid soap.  You can apply chg directly  to the skin and wash                       Gently with a scrungie or clean washcloth.  5.  Apply the CHG Soap to your body ONLY FROM THE NECK DOWN.   Do not use on face/ open                           Wound or open sores. Avoid contact with eyes, ears mouth and genitals (private parts).                       Wash face,  Genitals (private parts) with your normal soap.             6.  Wash thoroughly, paying special attention to the area where your surgery  will be performed.  7.  Thoroughly rinse your body with warm water from the neck down.  8.  DO NOT shower/wash with your normal soap after using  and rinsing off  the CHG Soap.                9.  Pat yourself dry with a clean towel.            10.  Wear clean pajamas.            11.  Place clean sheets on your bed the night of your first shower and do not  sleep with pets. Day of Surgery : Do not apply any lotions/deodorants the morning of surgery.  Please wear clean clothes to the hospital/surgery center.  FAILURE  TO FOLLOW THESE INSTRUCTIONS MAY RESULT IN THE CANCELLATION OF YOUR SURGERY PATIENT SIGNATURE_________________________________  NURSE SIGNATURE__________________________________  ________________________________________________________________________

## 2018-02-18 NOTE — Progress Notes (Signed)
02-16-18 ( Epic) EKG  02-10-18 (Epic) Pulmonary Clearance from Dr. Melvyn Novas  01-17-18 (Epic) CXR  05-22-17 (Epic) ECHO

## 2018-02-19 ENCOUNTER — Encounter (HOSPITAL_COMMUNITY): Payer: Self-pay

## 2018-02-19 ENCOUNTER — Encounter (HOSPITAL_COMMUNITY)
Admission: RE | Admit: 2018-02-19 | Discharge: 2018-02-19 | Disposition: A | Discharge status: Home / Self Care | Payer: Medicaid Other | Source: Ambulatory Visit | Attending: Urology | Admitting: Urology

## 2018-02-19 ENCOUNTER — Other Ambulatory Visit: Payer: Self-pay

## 2018-02-19 ENCOUNTER — Ambulatory Visit: Payer: Medicaid Other | Admitting: Radiation Oncology

## 2018-02-19 DIAGNOSIS — Z01812 Encounter for preprocedural laboratory examination: Secondary | ICD-10-CM | POA: Insufficient documentation

## 2018-02-19 DIAGNOSIS — C61 Malignant neoplasm of prostate: Secondary | ICD-10-CM | POA: Diagnosis not present

## 2018-02-19 LAB — BASIC METABOLIC PANEL
Anion gap: 10 (ref 5–15)
BUN: 12 mg/dL (ref 8–23)
CO2: 28 mmol/L (ref 22–32)
Calcium: 9.2 mg/dL (ref 8.9–10.3)
Chloride: 97 mmol/L — ABNORMAL LOW (ref 98–111)
Creatinine, Ser: 0.81 mg/dL (ref 0.61–1.24)
GFR calc Af Amer: >60 mL/min (ref >60)
GFR calc non Af Amer: >60 mL/min (ref >60)
Glucose, Bld: 160 mg/dL — ABNORMAL HIGH (ref 70–99)
Potassium: 4.6 mmol/L (ref 3.5–5.1)
Sodium: 135 mmol/L (ref 135–145)

## 2018-02-19 LAB — CBC
HCT: 44.2 % (ref 39.0–52.0)
Hemoglobin: 14.2 g/dL (ref 13.0–17.0)
MCH: 29.9 pg (ref 26.0–34.0)
MCHC: 32.1 g/dL (ref 30.0–36.0)
MCV: 93.1 fL (ref 80.0–100.0)
Platelets: 211 10*3/uL (ref 150–400)
RBC: 4.75 MIL/uL (ref 4.22–5.81)
RDW: 13.2 % (ref 11.5–15.5)
WBC: 8.8 10*3/uL (ref 4.0–10.5)
nRBC: 0 % (ref 0.0–0.2)

## 2018-02-19 LAB — HEMOGLOBIN A1C
Hgb A1c MFr Bld: 7.7 % — ABNORMAL HIGH (ref 4.8–5.6)
Mean Plasma Glucose: 174.29 mg/dL

## 2018-02-19 LAB — GLUCOSE, CAPILLARY: Glucose-Capillary: 142 mg/dL — ABNORMAL HIGH (ref 70–99)

## 2018-02-19 NOTE — Progress Notes (Signed)
Pt agreeable to signing consent form. But indicated that he will contact Dr. Johny Shears office to discuss. Pt's understanding is that he was to receive radiation treatment, prior to receiving [employer]. He would like to obtain clarification as to why this has changed. However, he would like to sign consent form today for the procedure.  Pt reported that he was scheduled to see Dr. Tammi Klippel today,which possible would have provided an explanation of the change, but is now scheduled to see him on 02-27-18, which is after his surgery date of 02-24-18.

## 2018-02-19 NOTE — Progress Notes (Signed)
PCP: Dr. Altamease Oiler  CARDIOLOGIST:  INFO IN Epic: 02-19-18 Labs, EKG, ECHO, CXR, and Pulmonary clearance by Dr. Melvyn Novas  INFO ON CHART:  BLOOD THINNERS AND LAST DOSES: ____________________________________  PATIENT SYMPTOMS AT TIME OF PREOP:   COPD GOLD III, 3L Oxygen at home

## 2018-02-20 NOTE — Progress Notes (Signed)
Anesthesia Chart Review   Case:  [id number] Date/Time:  02/24/18 0715   Procedures:      GOLD SEED IMPLANT (N/A )     SPACE OAR INSTILLATION (N/A )   Anesthesia type:  General   Pre-op diagnosis:  PROSTATE CANCER   Location:  [facility]   Surgeon:  [name], MD      DISCUSSION: 62 yo former smoker (4.6 pack years, quit 05/21/17) with h/o asthma, emphysema (O2 dependent 3L), bipolar 1 disorder, depression, prostate cancer scheduled for above surgery on 02/24/2018 with Dr. [name].    Pt last seen by pulmonologist, Dr. Christinia Gully, on 02/10/18.  Per his note, "There is no red line in COPD or chronic respiratory failure where I would say his risk is prohibitive for this type of prostate surgery unless he is having an acute exacerbation so I cleared him for surgery today though there certainly is an increased risk of postop pulmonary complications and if the surgery were purely elective and for instance cosmetic I would not recommend surgery at all but take this into account for all future considerations."  Pt seen in ED on 02/12/2018 for shortness of breath and wheezing which resolved with breathing treatment, prescribed Augmentin for sinusitis which he reports he has completed with improvement of sx.  I am examined pt during PST visit on 02/19/18.  Pt is talking in complete sentences without respiratory distress, on O2, no cough, no shortness of breath; lungs clear to auscultation.   Pt can proceed with planned procedure barring acute status change.  VS: BP (!) 149/83   Pulse 93   Temp 36.8 C (Oral)   Resp 20   Ht 5\' 11"  (1.803 m)   Wt 82.7 kg   SpO2 100%   BMI 25.42 kg/m   PROVIDERS: Clent Demark, PA-C is PCP   Christinia Gully, MD is Pulmonologist LABS: Labs reviewed: Acceptable for surgery. (all labs ordered are listed, but only abnormal results are displayed)  Labs Reviewed  HEMOGLOBIN A1C - Abnormal; Notable for the following components:      Result Value    Hgb A1c MFr Bld 7.7 (*)    All other components within normal limits  BASIC METABOLIC PANEL - Abnormal; Notable for the following components:   Chloride 97 (*)    Glucose, Bld 160 (*)    All other components within normal limits  GLUCOSE, CAPILLARY - Abnormal; Notable for the following components:   Glucose-Capillary 142 (*)    All other components within normal limits  CBC     IMAGES: Chest Xray 02/12/2018 FINDINGS: Hyperinflation with emphysematous disease. No acute airspace disease or effusion. Normal heart size. No pneumothorax. Multiple old right rib fractures.  IMPRESSION: No active cardiopulmonary disease. Hyperinflation with emphysematous disease.   EKG: 02/12/2018 Rate 92 bpm Sinus rhythm Borderline short PR interval Nonspecific intraventricular conduction delay Anteroseptal infarct, age indeterminate Baseline wander in lead V4  CV: Echo 05/22/2017 Study Conclusions  - Procedure narrative: Transthoracic echocardiography. Image   quality was poor. The study was technically difficult, as a   result of poor acoustic windows. Intravenous contrast (Definity)   was administered. - Left ventricle: The cavity size was normal. Wall thickness was   normal. Systolic function was vigorous. The estimated ejection   fraction was in the range of 65% to 70%. Wall motion was normal;   there were no regional wall motion abnormalities. Doppler   parameters are consistent with abnormal left ventricular  relaxation (grade 1 diastolic dysfunction). The E/e&' ratio is   between 8-15, suggesting indeterminate LV filling pressure. - Mitral valve: Mildly thickened leaflets . There was trivial   regurgitation. - Left atrium: The atrium was normal in size. - Right ventricle: The cavity size was normal. Wall thickness was   normal. Moderate hypokinesis, particularly toward the apex. - Inferior vena cava: The vessel was dilated. The respirophasic   diameter changes were  blunted (< 50%), consistent with elevated   central venous pressure.  Impressions:  - Compared to a prior study in 04/29/2017, the LVEF is higher at   65-70%. There is moderate apical RV systolic dysfunction. RVSP   could not be estimated in this study, but the IVC is dilated. Past Medical History:  Diagnosis Date  . Asthma   . Bipolar 1 disorder ([place]) 02/05/2012  . Colon cancer ([place]) 04/11/11   adenocarcinoma of colon, 7/19 nodes pos.FINISHED CHEMO/DR. SHERRILL  . COPD (chronic obstructive pulmonary disease) ([place])    SMOKER  . Depression   . Emphysema of lung ([place])   . Full dentures   . Hemorrhoids   . On home oxygen therapy    "3L; 24/7" (05/29/2017)  . Oxygen deficiency   . Pneumonia ~ 2016   "double pneumonia"  . Prostate cancer ([place])    Gleason score = 7, supposed to have radiation therapy but he has not followed up (05/29/2017)  . Rib fractures    hx of    Past Surgical History:  Procedure Laterality Date  . COLON SURGERY  04/11/11   Sigmoid colectomy  . COLOSTOMY REVISION  04/11/2011   Procedure: COLON RESECTION SIGMOID;  Surgeon: [name], MD;  Location: [facility];  Service: General;  Laterality: N/A;  low anterior colon resection   . HERNIA REPAIR    . INGUINAL HERNIA REPAIR Right 05/01/2012   Procedure: HERNIA REPAIR INGUINAL ADULT;  Surgeon: [name], MD;  Location: [facility];  Service: General;  Laterality: Right;  . INSERTION OF MESH Right 05/01/2012   Procedure: INSERTION OF MESH;  Surgeon: [name], MD;  Location: [facility];  Service: General;  Laterality: Right;  . PORT-A-CATH REMOVAL Left 05/01/2012   Procedure: REMOVAL Infusion Port;  Surgeon: [name], MD;  Location: [facility];  Service: General;  Laterality: Left;  . PORTACATH PLACEMENT  05/02/2011   Procedure: INSERTION PORT-A-CATH;  Surgeon: [name], MD;  Location: [facility];  Service: General;  Laterality: N/A;  . PROSTATE BIOPSY      MEDICATIONS: . albuterol (PROVENTIL) (2.5 MG/3ML) 0.083%  nebulizer solution  . amoxicillin-clavulanate (AUGMENTIN) 875-125 MG tablet  . ARIPiprazole (ABILIFY) 5 MG tablet  . benzonatate (TESSALON) 100 MG capsule  . Blood Glucose Monitoring Suppl (ACCU-CHEK AVIVA) device  . escitalopram (LEXAPRO) 20 MG tablet  . Fluticasone-Umeclidin-Vilant (TRELEGY ELLIPTA) 100-62.5-25 MCG/INH AEPB  . folic acid (FOLVITE) 1 MG tablet  . glucose blood (ACCU-CHEK AVIVA) test strip  . Lancets (ACCU-CHEK SOFT TOUCH) lancets  . lovastatin (MEVACOR) 20 MG tablet  . metFORMIN (GLUCOPHAGE) 1000 MG tablet  . OXYGEN  . pantoprazole (PROTONIX) 40 MG tablet  . polyethylene glycol (MIRALAX / GLYCOLAX) packet  . predniSONE (DELTASONE) 10 MG tablet  . senna-docusate (SENOKOT-S) 8.6-50 MG tablet   No current facility-administered medications for this encounter.      Maia Plan [facility] Pre-Surgical Testing [phone] 02/20/18 4:42 PM

## 2018-02-20 NOTE — Anesthesia Preprocedure Evaluation (Addendum)
Anesthesia Evaluation  Patient identified by MRN, date of birth, ID band Patient awake    Reviewed: Allergy & Precautions, NPO status , Patient's Chart, lab work & pertinent test results  Airway Mallampati: II  TM Distance: >3 FB     Dental   Pulmonary asthma , pneumonia, COPD, former smoker,    breath sounds clear to auscultation       Cardiovascular hypertension,  Rhythm:Regular Rate:Normal     Neuro/Psych    GI/Hepatic negative GI ROS, Neg liver ROS,   Endo/Other  diabetes  Renal/GU      Musculoskeletal   Abdominal   Peds  Hematology   Anesthesia Other Findings   Reproductive/Obstetrics                           Anesthesia Physical Anesthesia Plan  ASA: III  Anesthesia Plan: General   Post-op Pain Management:    Induction: Intravenous  PONV Risk Score and Plan: 2 and Ondansetron, Dexamethasone and Midazolam  Airway Management Planned: Oral ETT  Additional Equipment:   Intra-op Plan:   Post-operative Plan: Extubation in OR  Informed Consent: I have reviewed the patients History and Physical, chart, labs and discussed the procedure including the risks, benefits and alternatives for the proposed anesthesia with the patient or authorized representative who has indicated his/her understanding and acceptance.     Dental advisory given  Plan Discussed with: Anesthesiologist and CRNA  Anesthesia Plan Comments: (See PST note 02/19/2018, Konrad Felix, PA-C)      Anesthesia Quick Evaluation

## 2018-02-23 NOTE — H&P (Signed)
CC/HPI: I have prostate cancer.     Taylor [name] is a 63 yo WM who was diagnosed with Gleason 7 prostate cancer in [place] 3+ years ago. He was lost to follow up and was sent back by Domenica Fail PA for a PSA of 38 in August with a negative CT and bonescan for staging. He is voiding well on flomax with an IPSS of 7. He has not been sexually active. He has no prior history of stones, UTI's or other GU surgery.   He had colon cancer treated with partial colectomy and chemotherapy. He had a colostomy reversal and then had to have an incisional hernia repaired as well. He has been in remission for that.   He has COPD and is on O2. He has not smoked in 3 years.    ALLERGIES: CODEINE    MEDICATIONS: Abilify 5 mg tablet  Albuterol Sulfate  Diabetic Medication  Flomax  Folic Acid  Lexapro 10 mg tablet  Lovastatin  Oxygen     GU PSH: None     PSH Notes: Colon surgery     NON-GU PSH: Hernia Repair, Right    GU PMH: History of prostate cancer    NON-GU PMH: Anxiety Asthma Colon Cancer, History Depression Diabetes Type 2 Encounter for general adult medical examination without abnormal findings, Encounter for preventive health examination Hypercholesterolemia Other bipolar disorder    FAMILY HISTORY: 1 Daughter - Other heart failure - Father, Mother   SOCIAL HISTORY: Marital Status: Divorced Preferred Language: English; Race: White Current Smoking Status: Patient does not smoke anymore. Has not smoked since 11/22/2014.   Tobacco Use Assessment Completed: Used Tobacco in last 30 days? Drinks 2 caffeinated drinks per day.    REVIEW OF SYSTEMS:    GU Review Male:   Patient reports leakage of urine, trouble starting your stream, have to strain to urinate , and erection problems. Patient denies frequent urination, hard to postpone urination, burning/ pain with urination, get up at night to urinate, stream starts and stops, and penile pain.  Gastrointestinal (Upper):   Patient  denies nausea, vomiting, and indigestion/ heartburn.  Gastrointestinal (Lower):   Patient reports diarrhea. Patient denies constipation.  Constitutional:   Patient denies fever, night sweats, weight loss, and fatigue.  Skin:   Patient denies skin rash/ lesion and itching.  Eyes:   Patient denies blurred vision and double vision.  Ears/ Nose/ Throat:   Patient reports sinus problems. Patient denies sore throat.  Hematologic/Lymphatic:   Patient denies swollen glands and easy bruising.  Cardiovascular:   Patient denies chest pains and leg swelling.  Respiratory:   Patient reports shortness of breath. Patient denies cough.  Endocrine:   Patient denies excessive thirst.  Musculoskeletal:   Patient denies back pain and joint pain.  Neurological:   Patient denies headaches and dizziness.  Psychologic:   Patient reports depression and anxiety.    VITAL SIGNS:      12/03/2017 10:21 AM  Weight 170 lb / 77.11 kg  Height 71 in / 180.34 cm  BP 151/89 mmHg  Heart Rate 125 /min  BMI 23.7 kg/m   GU PHYSICAL EXAMINATION:    Anus and Perineum: No hemorrhoids. No anal stenosis. No rectal fissure, no anal fissure. No edema, no dimple, no perineal tenderness, no anal tenderness.  Scrotum: No lesions. No edema. No cysts. No warts.  Epididymides: Right: no spermatocele, no masses, no cysts, no tenderness, no induration, no enlargement. Left: no spermatocele, no masses, no cysts, no tenderness,  no induration, no enlargement.  Testes: No tenderness, no swelling, no enlargement left testes. No tenderness, no swelling, no enlargement right testes. Normal location left testes. Normal location right testes. No mass, no cyst, no varicocele, no hydrocele left testes. No mass, no cyst, no varicocele, no hydrocele right testes.  Urethral Meatus: Normal size. No lesion, no wart, no discharge, no polyp. Normal location.  Penis: Circumcised, no warts, no cracks. No dorsal Peyronie's plaques, no left corporal Peyronie's  plaques, no right corporal Peyronie's plaques, no scarring, no warts. No balanitis, no meatal stenosis.  Prostate: Prostate 2 + size. Left apex 15 mm prostate nodule. Left lobe normal consistency, right lobe normal consistency. Symmetrical lobes. Left lobe no tenderness, right lobe no tenderness.   Seminal Vesicles: Nonpalpable.  Sphincter Tone: Normal sphincter. No rectal tenderness. No rectal mass.    MULTI-SYSTEM PHYSICAL EXAMINATION:    Constitutional: Well-nourished. No physical deformities. Normally developed. Good grooming.  Neck: Neck symmetrical, not swollen. Normal tracheal position.  Respiratory: Labored breathing. Decreased BS. No use of accessory muscles.   Cardiovascular: sinus tachycardia. No murmur, no gallop. Normal temperature, normal extremity pulses, no swelling, no varicosities.   Lymphatic: No enlargement of neck, axillae, groin.  Skin: No paleness, no jaundice, no cyanosis. No lesion, no ulcer, no rash.  Neurologic / Psychiatric: Oriented to time, oriented to place, oriented to person. No depression, no anxiety, no agitation.  Gastrointestinal: Obese abdomen. Midline scar. No mass, no tenderness, no rigidity.   Musculoskeletal: Normal gait and station of head and neck.     PAST DATA REVIEWED:  Source Of History:  Patient  Lab Test Review:   PSA  Urine Test Review:   Urinalysis  X-Ray Review: Bone Scan: Reviewed Films. Reviewed Report. Discussed With Patient.     PROCEDURES:         PVR Ultrasound - 32992  Scanned Volume: 72 cc         Urinalysis w/Scope Dipstick Dipstick Cont'd Micro  Color: Amber Bilirubin: Neg mg/dL WBC/hpf: 0 - 5/hpf  Appearance: Clear Ketones: Neg mg/dL RBC/hpf: NS (Not Seen)  Specific Gravity: 1.030 Blood: Neg ery/uL Bacteria: NS (Not Seen)  pH: 5.5 Protein: 1+ mg/dL Cystals: NS (Not Seen)  Glucose: Neg mg/dL Urobilinogen: 0.2 mg/dL Casts: NS (Not Seen)    Nitrites: Neg Trichomonas: Not Present    Leukocyte Esterase: Neg leu/uL Mucous:  Present      Epithelial Cells: 0 - 5/hpf      Yeast: NS (Not Seen)      Sperm: Not Present    ASSESSMENT:      ICD-10 Details  1 GU:   Prostate Cancer - C61 He has high risk Gleason 7 prostate cancer that is T2/3 N0 M0. I am going to repeat a PSA and get a testosterone today. I will get him set up to see Dr. Tammi Klippel and will initiate ADT with firmagon. I will get his records from [place]. I don't think we need to repeat a biopsy.   2   Incomplete bladder emptying - R39.14 His PVR was only 38ml today.   3   Elevated PSA - R97.20   4   Prostate nodule w/ LUTS - N40.3    PLAN:           Orders Labs PSA, Total Testosterone          Schedule Labs: 1 Month - PSA    1 Month - Total Testosterone  Return Visit/Planned Activity: 1-2 Weeks - Nurse Visit  Note: Mills Koller  Return Visit/Planned Activity: 4-6 Weeks - Office Visit, Lupron             Note: 1 month after firmagon for Lupron 45mg   Return Visit/Planned Activity: Next Available Appointment - Consult Refer to physician - Sheral Apley. Tammi Klippel, MD             Note: 1 month after firmagon for lupron          Document Letter(s):  Created for Patient: Clinical Summary         Notes:   I discussed the risks, benefits and rational for Androgen Deprivation therapy.  I reviewed the options including LHRH antagonists, LHRH agonists, Orchiectomy and second line therapies as indicated.  I reviewed the duration of therapy as required for either adjuvant, intermittent or continuous therapy.  I reviewed the side effects of therapy including but not limited to fatigue, hot flashes, bone and muscle loss, metabolic abnormalities, cardiovascular toxicity, gynecomastia, loss of libido, erectile dysfunction, depression and cognitive decline.  I reviewed the importance of diet and exercise along with bone supportive therapy while on ADT.     CC: Domenica Fail PA.

## 2018-02-24 ENCOUNTER — Ambulatory Visit (HOSPITAL_COMMUNITY): Payer: Medicaid Other | Admitting: Anesthesiology

## 2018-02-24 ENCOUNTER — Encounter (HOSPITAL_COMMUNITY): Payer: Self-pay | Admitting: *Deleted

## 2018-02-24 ENCOUNTER — Ambulatory Visit (HOSPITAL_COMMUNITY): Payer: Medicaid Other | Admitting: Physician Assistant

## 2018-02-24 ENCOUNTER — Ambulatory Visit (HOSPITAL_COMMUNITY)
Admission: RE | Admit: 2018-02-24 | Discharge: 2018-02-24 | Disposition: A | Discharge status: Home / Self Care | Payer: Medicaid Other | Attending: Urology | Admitting: Urology

## 2018-02-24 ENCOUNTER — Encounter (HOSPITAL_COMMUNITY)
Admission: RE | Disposition: A | Discharge status: Home / Self Care | Payer: Self-pay | Source: Home / Self Care | Attending: Urology

## 2018-02-24 DIAGNOSIS — Z9221 Personal history of antineoplastic chemotherapy: Secondary | ICD-10-CM | POA: Diagnosis not present

## 2018-02-24 DIAGNOSIS — F319 Bipolar disorder, unspecified: Secondary | ICD-10-CM | POA: Insufficient documentation

## 2018-02-24 DIAGNOSIS — E119 Type 2 diabetes mellitus without complications: Secondary | ICD-10-CM | POA: Diagnosis not present

## 2018-02-24 DIAGNOSIS — R3914 Feeling of incomplete bladder emptying: Secondary | ICD-10-CM | POA: Insufficient documentation

## 2018-02-24 DIAGNOSIS — Z87891 Personal history of nicotine dependence: Secondary | ICD-10-CM | POA: Diagnosis not present

## 2018-02-24 DIAGNOSIS — J449 Chronic obstructive pulmonary disease, unspecified: Secondary | ICD-10-CM | POA: Diagnosis not present

## 2018-02-24 DIAGNOSIS — C61 Malignant neoplasm of prostate: Secondary | ICD-10-CM | POA: Diagnosis not present

## 2018-02-24 DIAGNOSIS — E7800 Pure hypercholesterolemia, unspecified: Secondary | ICD-10-CM | POA: Diagnosis not present

## 2018-02-24 DIAGNOSIS — Z85038 Personal history of other malignant neoplasm of large intestine: Secondary | ICD-10-CM | POA: Insufficient documentation

## 2018-02-24 DIAGNOSIS — Z79899 Other long term (current) drug therapy: Secondary | ICD-10-CM | POA: Diagnosis not present

## 2018-02-24 DIAGNOSIS — J45909 Unspecified asthma, uncomplicated: Secondary | ICD-10-CM | POA: Insufficient documentation

## 2018-02-24 DIAGNOSIS — Z9981 Dependence on supplemental oxygen: Secondary | ICD-10-CM | POA: Diagnosis not present

## 2018-02-24 DIAGNOSIS — Z9049 Acquired absence of other specified parts of digestive tract: Secondary | ICD-10-CM | POA: Diagnosis not present

## 2018-02-24 HISTORY — DX: Dyspnea, unspecified: R06.00

## 2018-02-24 HISTORY — PX: SPACE OAR INSTILLATION: SHX6769

## 2018-02-24 HISTORY — PX: GOLD SEED IMPLANT: SHX6343

## 2018-02-24 LAB — GLUCOSE, CAPILLARY
Glucose-Capillary: 168 mg/dL — ABNORMAL HIGH (ref 70–99)
Glucose-Capillary: 154 mg/dL — ABNORMAL HIGH (ref 70–99)

## 2018-02-24 SURGERY — INSERTION, GOLD SEEDS
Anesthesia: General

## 2018-02-24 MED ORDER — FENTANYL CITRATE (PF) 100 MCG/2ML IJ SOLN: 25.0000 ug | INTRAMUSCULAR | Status: DC | PRN

## 2018-02-24 MED ORDER — SODIUM CHLORIDE 0.9% FLUSH: 3.0000 mL | INTRAVENOUS | Status: DC | PRN

## 2018-02-24 MED ORDER — SODIUM CHLORIDE 0.9 % IV SOLN: 250.0000 mL | INTRAVENOUS | Status: DC | PRN

## 2018-02-24 MED ORDER — LIDOCAINE HCL 1 % IJ SOLN
INTRAMUSCULAR | Status: DC | PRN
  Administered 2018-02-24: 30 mg via INTRADERMAL

## 2018-02-24 MED ORDER — EPHEDRINE SULFATE 50 MG/ML IJ SOLN
INTRAMUSCULAR | Status: DC | PRN
  Administered 2018-02-24: 10 mg via INTRAVENOUS

## 2018-02-24 MED ORDER — ONDANSETRON HCL 4 MG/2ML IJ SOLN
INTRAMUSCULAR | Status: AC
  Filled 2018-02-24: qty 2

## 2018-02-24 MED ORDER — ACETAMINOPHEN 650 MG RE SUPP
650.0000 mg | RECTAL | Status: DC | PRN
  Filled 2018-02-24: qty 1

## 2018-02-24 MED ORDER — 0.9 % SODIUM CHLORIDE (POUR BTL) OPTIME
TOPICAL | Status: DC | PRN
  Administered 2018-02-24: 500 mL

## 2018-02-24 MED ORDER — FENTANYL CITRATE (PF) 100 MCG/2ML IJ SOLN
INTRAMUSCULAR | Status: AC
  Filled 2018-02-24: qty 2

## 2018-02-24 MED ORDER — CEFAZOLIN SODIUM-DEXTROSE 2-4 GM/100ML-% IV SOLN
2.0000 g | INTRAVENOUS | Status: AC
  Administered 2018-02-24: 2 g via INTRAVENOUS
  Filled 2018-02-24: qty 100

## 2018-02-24 MED ORDER — SODIUM CHLORIDE 0.9% FLUSH: 3.0000 mL | Freq: Two times a day (BID) | INTRAVENOUS | Status: DC

## 2018-02-24 MED ORDER — LACTATED RINGERS IV SOLN
INTRAVENOUS | Status: DC
  Administered 2018-02-24: 07:00:00 via INTRAVENOUS

## 2018-02-24 MED ORDER — PROPOFOL 10 MG/ML IV BOLUS
INTRAVENOUS | Status: AC
  Filled 2018-02-24: qty 40

## 2018-02-24 MED ORDER — FENTANYL CITRATE (PF) 100 MCG/2ML IJ SOLN
25.0000 ug | INTRAMUSCULAR | Status: DC | PRN
  Administered 2018-02-24: 25 ug via INTRAVENOUS
  Administered 2018-02-24: 50 ug via INTRAVENOUS
  Administered 2018-02-24: 25 ug via INTRAVENOUS

## 2018-02-24 MED ORDER — ONDANSETRON HCL 4 MG/2ML IJ SOLN
INTRAMUSCULAR | Status: DC | PRN
  Administered 2018-02-24: 4 mg via INTRAVENOUS

## 2018-02-24 MED ORDER — MORPHINE SULFATE (PF) 4 MG/ML IV SOLN: 2.0000 mg | INTRAVENOUS | Status: DC | PRN

## 2018-02-24 MED ORDER — PROPOFOL 10 MG/ML IV BOLUS
INTRAVENOUS | Status: DC | PRN
  Administered 2018-02-24: 80 mg via INTRAVENOUS

## 2018-02-24 MED ORDER — ACETAMINOPHEN 325 MG PO TABS: 650.0000 mg | ORAL_TABLET | ORAL | Status: DC | PRN

## 2018-02-24 MED ORDER — MIDAZOLAM HCL 2 MG/2ML IJ SOLN
INTRAMUSCULAR | Status: AC
  Filled 2018-02-24: qty 2

## 2018-02-24 MED ORDER — DEXAMETHASONE SODIUM PHOSPHATE 10 MG/ML IJ SOLN
INTRAMUSCULAR | Status: AC
  Filled 2018-02-24: qty 1

## 2018-02-24 MED ORDER — OXYCODONE HCL 5 MG PO TABS: 5.0000 mg | ORAL_TABLET | ORAL | Status: DC | PRN

## 2018-02-24 MED ORDER — EPHEDRINE 5 MG/ML INJ
INTRAVENOUS | Status: AC
  Filled 2018-02-24: qty 10

## 2018-02-24 MED ORDER — LACTATED RINGERS IV SOLN
INTRAVENOUS | Status: DC | PRN
  Administered 2018-02-24: 07:00:00 via INTRAVENOUS

## 2018-02-24 SURGICAL SUPPLY — 19 items
COVER SURGICAL LIGHT HANDLE (MISCELLANEOUS) IMPLANT
COVER WAND RF STERILE (DRAPES) IMPLANT
DRSG TELFA 3X8 NADH (GAUZE/BANDAGES/DRESSINGS) IMPLANT
GAUZE SPONGE 4X4 12PLY STRL (GAUZE/BANDAGES/DRESSINGS) ×3 IMPLANT
GLOVE BIO SURGEON STRL SZ7.5 (GLOVE) ×3 IMPLANT
IMPL SPACEOAR SYSTEM 10ML (Spacer) ×1 IMPLANT
IMPLANT SPACEOAR SYSTEM 10ML (Spacer) ×3 IMPLANT
MARKER GOLD PRELOAD 1.2X3 (Urological Implant) ×1 IMPLANT
NDL SAFETY ECLIPSE 18X1.5 (NEEDLE) IMPLANT
NEEDLE HYPO 18GX1.5 SHARP (NEEDLE)
NEEDLE SPNL 22GX7 QUINCKE BK (NEEDLE) IMPLANT
SEED GOLD PRELOAD 1.2X3 (Urological Implant) ×3 IMPLANT
SURGILUBE 2OZ TUBE FLIPTOP (MISCELLANEOUS) ×3 IMPLANT
SYR 20CC LL (SYRINGE) IMPLANT
TOWEL OR 17X26 10 PK STRL BLUE (TOWEL DISPOSABLE) ×3 IMPLANT
TUBING CONNECTING 10 (TUBING) IMPLANT
TUBING CONNECTING 10' (TUBING)
UNDERPAD 30X30 (UNDERPADS AND DIAPERS) ×3 IMPLANT
WATER STERILE IRR 500ML POUR (IV SOLUTION) ×3 IMPLANT

## 2018-02-24 NOTE — Discharge Instructions (Addendum)

## 2018-02-24 NOTE — Transfer of Care (Signed)
Immediate Anesthesia Transfer of Care Note  Patient: Taylor [name]  Procedure(s) Performed: GOLD SEED IMPLANT (N/A ) SPACE OAR INSTILLATION (N/A )  Patient Location: PACU  Anesthesia Type:General  Level of Consciousness: awake, alert , oriented and patient cooperative  Airway & Oxygen Therapy: Patient Spontanous Breathing and Patient connected to face mask oxygen  Post-op Assessment: Report given to RN and Post -op Vital signs reviewed and stable  Post vital signs: Reviewed and stable  Last Vitals:  Vitals Value Taken Time  BP 143/96 02/24/2018  8:49 AM  Temp 37.1 C 02/24/2018  8:49 AM  Pulse 109 02/24/2018  8:54 AM  Resp 15 02/24/2018  8:54 AM  SpO2 100 % 02/24/2018  8:54 AM  Vitals shown include unvalidated device data.  Last Pain:  Vitals:   02/24/18 0600  TempSrc:   PainSc: 0-No pain      Patients Stated Pain Goal: 5 (02/33/43 5686)  Complications: No apparent anesthesia complications

## 2018-02-24 NOTE — Op Note (Signed)
Procedure: Transrectal ultrasound-guided placement of fiducial markers and SpaceOAR implant.  Preop diagnosis: Prostate cancer.  Postop diagnosis: Same.  Surgeon: Dr. [name].  Assistant: Dr. Tyler Pita.  Specimen: None.  Drains: None.  EBL: None.  Complications: None.  Indications: Taylor [name] is a 63 year old white male with prostate cancer who is to undergo external beam radiation therapy.  He is to have placement of fiducial markers and SpaceOAR gel.  Procedure: He was given 2 g of Ancef.  He was taken operating room where general anesthetic was induced.  He was placed in the lithotomy position and fitted with PAS hose.  His perineum was clipped.  He was prepped with Betadine solution.  The transrectal ultrasound probe was placed in position by Dr. Tammi [name] who operated the ultrasound during the procedure.  He was then draped in a sterile fashion.  The preloaded needles with the gold fiducial markers were then passed under ultrasound guidance.  A marker was deployed at the right base medially, the right apex medially in the left mid lateral prostate.  The spinal needle was then passed under ultrasound guidance into the fat stripe posterior to the prostate anterior to the rectum and saline was injected to confirm the position in the midline at the mid prostate.  The SpaceOAR polymer was then injected over a space of 10 seconds and the needle was withdrawn.  Ultrasound inspection confirmed excellent distribution.  A dressing was applied.  He was taken down from lithotomy position, his anesthetic was reversed and he was moved recovery in stable condition.  There were no complications

## 2018-02-24 NOTE — Anesthesia Postprocedure Evaluation (Signed)
Anesthesia Post Note  Patient: Taylor [name]  Procedure(s) Performed: GOLD SEED IMPLANT (N/A ) SPACE OAR INSTILLATION (N/A )     Patient location during evaluation: PACU Anesthesia Type: General Level of consciousness: awake Pain management: pain level controlled Vital Signs Assessment: post-procedure vital signs reviewed and stable Respiratory status: spontaneous breathing Cardiovascular status: stable Postop Assessment: no apparent nausea or vomiting Anesthetic complications: no    Last Vitals:  Vitals:   02/24/18 0930 02/24/18 0945  BP: 125/87   Pulse: 97 92  Resp: 17 10  Temp:    SpO2: 94% 95%    Last Pain:  Vitals:   02/24/18 0945  TempSrc:   PainSc: 0-No pain                 Taylor [name]

## 2018-02-24 NOTE — Anesthesia Procedure Notes (Signed)
Procedure Name: LMA Insertion Date/Time: 02/24/2018 8:19 AM Performed by: Garrel Ridgel, CRNA Pre-anesthesia Checklist: Patient identified, Emergency Drugs available, Suction available, Timeout performed and Patient being monitored Patient Re-evaluated:Patient Re-evaluated prior to induction Oxygen Delivery Method: Circle system utilized Preoxygenation: Pre-oxygenation with 100% oxygen Induction Type: IV induction Ventilation: Mask ventilation without difficulty LMA: LMA inserted LMA Size: 4.0 Number of attempts: 1 Placement Confirmation: positive ETCO2 Tube secured with: Tape Dental Injury: Teeth and Oropharynx as per pre-operative assessment

## 2018-02-25 ENCOUNTER — Encounter: Payer: Self-pay | Admitting: General Practice

## 2018-02-25 NOTE — Progress Notes (Signed)
[facility] CSW Progress Notes  Call from patient's lawyer, Saunders Glance [phone]) is trying to help patient get into Assisted Living or [place].  Requested copy of list of facilities in [place] and surrounding counties.  List faxed to [phone].  No information provided about patient.    Edwyna Shell, LCSW Clinical Social Worker Phone:  [phone]

## 2018-02-27 ENCOUNTER — Encounter (HOSPITAL_COMMUNITY): Payer: Self-pay | Admitting: Urology

## 2018-02-27 ENCOUNTER — Encounter: Payer: Self-pay | Admitting: Medical Oncology

## 2018-02-27 ENCOUNTER — Ambulatory Visit
Admission: RE | Admit: 2018-02-27 | Discharge: 2018-02-27 | Disposition: A | Discharge status: Home / Self Care | Payer: Medicaid Other | Source: Ambulatory Visit | Attending: Radiation Oncology | Admitting: Radiation Oncology

## 2018-02-27 DIAGNOSIS — Z510 Encounter for antineoplastic radiation therapy: Secondary | ICD-10-CM | POA: Insufficient documentation

## 2018-02-27 DIAGNOSIS — C61 Malignant neoplasm of prostate: Secondary | ICD-10-CM

## 2018-02-27 NOTE — Progress Notes (Signed)
  Radiation Oncology         [phone] ________________________________  Name: Taylor [name] MRN: [id number]  Date: 02/27/2018  DOB: 1955-02-03  SIMULATION AND TREATMENT PLANNING NOTE  No diagnosis found.  DIAGNOSIS:  63 y.o. 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  NARRATIVE:  The patient was brought to the [facility].  Identity was confirmed.  All relevant records and images related to the planned course of therapy were reviewed.  The patient freely provided informed written consent to proceed with treatment after reviewing the details related to the planned course of therapy. The consent form was witnessed and verified by the simulation staff.  Then, the patient was set-up in a stable reproducible supine position for radiation therapy.  A vacuum lock pillow device was custom fabricated to position his legs in a reproducible immobilized position.  Then, I performed a urethrogram under sterile conditions to identify the prostatic apex.  CT images were obtained.  Surface markings were placed.  The CT images were loaded into the planning software.  Then the prostate target and avoidance structures including the rectum, bladder, bowel and hips were contoured.  Treatment planning then occurred.  The radiation prescription was entered and confirmed.  A total of one complex treatment devices were fabricated. I have requested : Intensity Modulated Radiotherapy (IMRT) is medically necessary for this case for the following reason:  Rectal sparing.Marland Kitchen  PLAN:  The patient will receive 45 Gy in 25 fractions of 1.8 Gy, followed by a boost to the prostate to a total dose of 75 Gy with 15 additional fractions of 2.0 Gy.  ________________________________  Sheral Apley Tammi Klippel, M.D.  This document serves as a record of services personally performed by Tyler Pita, MD. It was created on his behalf by Wilburn Mylar, a  trained medical scribe. The creation of this record is based on the scribe's personal observations and the provider's statements to them. This document has been checked and approved by the attending provider.

## 2018-03-02 ENCOUNTER — Other Ambulatory Visit: Payer: Self-pay | Admitting: Radiation Oncology

## 2018-03-02 ENCOUNTER — Ambulatory Visit (INDEPENDENT_AMBULATORY_CARE_PROVIDER_SITE_OTHER): Payer: Self-pay | Admitting: Primary Care

## 2018-03-02 DIAGNOSIS — J441 Chronic obstructive pulmonary disease with (acute) exacerbation: Secondary | ICD-10-CM

## 2018-03-04 ENCOUNTER — Telehealth: Payer: Self-pay | Admitting: Radiation Oncology

## 2018-03-04 NOTE — Telephone Encounter (Signed)
Returned call from [place] reference order placed Monday (2/10) for oxygen. Santiago Glad states, " a brand new order will need to be placed detailing saturation ambulating without and with oxygen, liters prescribed, frequency, etc." Explained the patient will return on 2/20 to begin radiation then an assessment can be done and new order entered. She verbalized understanding. Dr. Tammi Klippel informed.

## 2018-03-06 DIAGNOSIS — Z510 Encounter for antineoplastic radiation therapy: Secondary | ICD-10-CM | POA: Diagnosis not present

## 2018-03-07 ENCOUNTER — Emergency Department (HOSPITAL_COMMUNITY): Payer: Medicaid Other

## 2018-03-07 ENCOUNTER — Inpatient Hospital Stay (HOSPITAL_COMMUNITY)
Admission: EM | Admit: 2018-03-07 | Discharge: 2018-03-12 | DRG: 190 | Disposition: A | Discharge status: Home / Self Care | Payer: Medicaid Other | Attending: Internal Medicine | Admitting: Internal Medicine

## 2018-03-07 ENCOUNTER — Other Ambulatory Visit: Payer: Self-pay

## 2018-03-07 ENCOUNTER — Encounter (HOSPITAL_COMMUNITY): Payer: Self-pay | Admitting: Emergency Medicine

## 2018-03-07 DIAGNOSIS — J439 Emphysema, unspecified: Principal | ICD-10-CM | POA: Diagnosis present

## 2018-03-07 DIAGNOSIS — Z590 Homelessness: Secondary | ICD-10-CM

## 2018-03-07 DIAGNOSIS — Z885 Allergy status to narcotic agent status: Secondary | ICD-10-CM

## 2018-03-07 DIAGNOSIS — R140 Abdominal distension (gaseous): Secondary | ICD-10-CM

## 2018-03-07 DIAGNOSIS — E871 Hypo-osmolality and hyponatremia: Secondary | ICD-10-CM | POA: Diagnosis present

## 2018-03-07 DIAGNOSIS — F319 Bipolar disorder, unspecified: Secondary | ICD-10-CM | POA: Diagnosis present

## 2018-03-07 DIAGNOSIS — Z801 Family history of malignant neoplasm of trachea, bronchus and lung: Secondary | ICD-10-CM

## 2018-03-07 DIAGNOSIS — R0603 Acute respiratory distress: Secondary | ICD-10-CM

## 2018-03-07 DIAGNOSIS — Z9981 Dependence on supplemental oxygen: Secondary | ICD-10-CM

## 2018-03-07 DIAGNOSIS — Z85038 Personal history of other malignant neoplasm of large intestine: Secondary | ICD-10-CM

## 2018-03-07 DIAGNOSIS — J9622 Acute and chronic respiratory failure with hypercapnia: Secondary | ICD-10-CM | POA: Diagnosis present

## 2018-03-07 DIAGNOSIS — J9621 Acute and chronic respiratory failure with hypoxia: Secondary | ICD-10-CM | POA: Diagnosis present

## 2018-03-07 DIAGNOSIS — J9611 Chronic respiratory failure with hypoxia: Secondary | ICD-10-CM | POA: Diagnosis present

## 2018-03-07 DIAGNOSIS — Z720 Tobacco use: Secondary | ICD-10-CM | POA: Diagnosis present

## 2018-03-07 DIAGNOSIS — J9612 Chronic respiratory failure with hypercapnia: Secondary | ICD-10-CM

## 2018-03-07 DIAGNOSIS — Z79899 Other long term (current) drug therapy: Secondary | ICD-10-CM

## 2018-03-07 DIAGNOSIS — Z9221 Personal history of antineoplastic chemotherapy: Secondary | ICD-10-CM

## 2018-03-07 DIAGNOSIS — Z7952 Long term (current) use of systemic steroids: Secondary | ICD-10-CM

## 2018-03-07 DIAGNOSIS — R197 Diarrhea, unspecified: Secondary | ICD-10-CM

## 2018-03-07 DIAGNOSIS — C61 Malignant neoplasm of prostate: Secondary | ICD-10-CM | POA: Diagnosis present

## 2018-03-07 DIAGNOSIS — F17210 Nicotine dependence, cigarettes, uncomplicated: Secondary | ICD-10-CM | POA: Diagnosis present

## 2018-03-07 DIAGNOSIS — F419 Anxiety disorder, unspecified: Secondary | ICD-10-CM | POA: Diagnosis present

## 2018-03-07 DIAGNOSIS — J441 Chronic obstructive pulmonary disease with (acute) exacerbation: Secondary | ICD-10-CM | POA: Diagnosis present

## 2018-03-07 DIAGNOSIS — R55 Syncope and collapse: Secondary | ICD-10-CM | POA: Diagnosis present

## 2018-03-07 DIAGNOSIS — Z8249 Family history of ischemic heart disease and other diseases of the circulatory system: Secondary | ICD-10-CM

## 2018-03-07 DIAGNOSIS — Z7984 Long term (current) use of oral hypoglycemic drugs: Secondary | ICD-10-CM

## 2018-03-07 DIAGNOSIS — I951 Orthostatic hypotension: Secondary | ICD-10-CM | POA: Diagnosis present

## 2018-03-07 DIAGNOSIS — I10 Essential (primary) hypertension: Secondary | ICD-10-CM | POA: Diagnosis present

## 2018-03-07 DIAGNOSIS — E119 Type 2 diabetes mellitus without complications: Secondary | ICD-10-CM

## 2018-03-07 HISTORY — DX: Acute and chronic respiratory failure, unspecified whether with hypoxia or hypercapnia: J96.20

## 2018-03-07 LAB — CBC WITH DIFFERENTIAL/PLATELET
Abs Immature Granulocytes: 0.1 10*3/uL — ABNORMAL HIGH (ref 0.00–0.07)
BASOS ABS: 0 10*3/uL (ref 0.0–0.1)
Basophils Relative: 0 %
Eosinophils Absolute: 0 10*3/uL (ref 0.0–0.5)
Eosinophils Relative: 0 %
HCT: 40.9 % (ref 39.0–52.0)
Hemoglobin: 13.1 g/dL (ref 13.0–17.0)
Immature Granulocytes: 1 %
Lymphocytes Relative: 16 %
Lymphs Abs: 1.5 10*3/uL (ref 0.7–4.0)
MCH: 29 pg (ref 26.0–34.0)
MCHC: 32 g/dL (ref 30.0–36.0)
MCV: 90.5 fL (ref 80.0–100.0)
Monocytes Absolute: 0.6 10*3/uL (ref 0.1–1.0)
Monocytes Relative: 7 %
NRBC: 0 % (ref 0.0–0.2)
Neutro Abs: 6.8 10*3/uL (ref 1.7–7.7)
Neutrophils Relative %: 76 %
Platelets: 311 10*3/uL (ref 150–400)
RBC: 4.52 MIL/uL (ref 4.22–5.81)
RDW: 12.9 % (ref 11.5–15.5)
WBC: 9.1 10*3/uL (ref 4.0–10.5)

## 2018-03-07 LAB — COMPREHENSIVE METABOLIC PANEL
ALT: 20 U/L (ref 0–44)
AST: 25 U/L (ref 15–41)
Albumin: 4.5 g/dL (ref 3.5–5.0)
Alkaline Phosphatase: 48 U/L (ref 38–126)
Anion gap: 12 (ref 5–15)
BUN: 14 mg/dL (ref 8–23)
CHLORIDE: 92 mmol/L — AB (ref 98–111)
CO2: 26 mmol/L (ref 22–32)
Calcium: 8.7 mg/dL — ABNORMAL LOW (ref 8.9–10.3)
Creatinine, Ser: 0.92 mg/dL (ref 0.61–1.24)
GFR calc Af Amer: >60 mL/min (ref >60)
GFR calc non Af Amer: >60 mL/min (ref >60)
Glucose, Bld: 139 mg/dL — ABNORMAL HIGH (ref 70–99)
POTASSIUM: 4 mmol/L (ref 3.5–5.1)
Sodium: 130 mmol/L — ABNORMAL LOW (ref 135–145)
Total Bilirubin: 0.5 mg/dL (ref 0.3–1.2)
Total Protein: 7.1 g/dL (ref 6.5–8.1)

## 2018-03-07 LAB — TROPONIN I

## 2018-03-07 MED ORDER — MORPHINE SULFATE (PF) 4 MG/ML IV SOLN
4.0000 mg | Freq: Once | INTRAVENOUS | Status: AC
  Administered 2018-03-07: 4 mg via INTRAVENOUS
  Filled 2018-03-07: qty 1

## 2018-03-07 NOTE — ED Triage Notes (Addendum)
Pt BIB [facility] from home, on CPAP, c/o shortness of breath x 2 days, worsening today. Also c/o abdominal pain/distension and diarrhea. Hx COPD, on home O2. EMS vitals: BP 157/114, HR 116, RR-30-40. Given 10mg  albuterol, 1mg  atrovent, 125mg  solumedrol, and 2g mag PTA.

## 2018-03-08 ENCOUNTER — Encounter (HOSPITAL_COMMUNITY): Payer: Self-pay | Admitting: Internal Medicine

## 2018-03-08 ENCOUNTER — Observation Stay (HOSPITAL_COMMUNITY): Payer: Medicaid Other

## 2018-03-08 ENCOUNTER — Other Ambulatory Visit: Payer: Self-pay

## 2018-03-08 ENCOUNTER — Observation Stay (HOSPITAL_BASED_OUTPATIENT_CLINIC_OR_DEPARTMENT_OTHER): Payer: Medicaid Other

## 2018-03-08 DIAGNOSIS — R0602 Shortness of breath: Secondary | ICD-10-CM

## 2018-03-08 DIAGNOSIS — J9621 Acute and chronic respiratory failure with hypoxia: Secondary | ICD-10-CM

## 2018-03-08 DIAGNOSIS — R55 Syncope and collapse: Secondary | ICD-10-CM | POA: Diagnosis present

## 2018-03-08 DIAGNOSIS — E871 Hypo-osmolality and hyponatremia: Secondary | ICD-10-CM | POA: Diagnosis present

## 2018-03-08 DIAGNOSIS — J441 Chronic obstructive pulmonary disease with (acute) exacerbation: Secondary | ICD-10-CM | POA: Diagnosis not present

## 2018-03-08 DIAGNOSIS — R197 Diarrhea, unspecified: Secondary | ICD-10-CM

## 2018-03-08 LAB — BASIC METABOLIC PANEL
Anion gap: 13 (ref 5–15)
BUN: 13 mg/dL (ref 8–23)
CALCIUM: 9 mg/dL (ref 8.9–10.3)
CO2: 30 mmol/L (ref 22–32)
Chloride: 92 mmol/L — ABNORMAL LOW (ref 98–111)
Creatinine, Ser: 0.98 mg/dL (ref 0.61–1.24)
GFR calc Af Amer: >60 mL/min (ref >60)
GFR calc non Af Amer: >60 mL/min (ref >60)
Glucose, Bld: 227 mg/dL — ABNORMAL HIGH (ref 70–99)
Potassium: 4.1 mmol/L (ref 3.5–5.1)
Sodium: 135 mmol/L (ref 135–145)

## 2018-03-08 LAB — GLUCOSE, CAPILLARY
Glucose-Capillary: 183 mg/dL — ABNORMAL HIGH (ref 70–99)
Glucose-Capillary: 161 mg/dL — ABNORMAL HIGH (ref 70–99)
Glucose-Capillary: 114 mg/dL — ABNORMAL HIGH (ref 70–99)
Glucose-Capillary: 92 mg/dL (ref 70–99)

## 2018-03-08 LAB — ECHOCARDIOGRAM COMPLETE
Height: 71 in
Weight: 2994.73 oz

## 2018-03-08 LAB — INFLUENZA PANEL BY PCR (TYPE A & B)
Influenza A By PCR: NEGATIVE
Influenza B By PCR: NEGATIVE

## 2018-03-08 LAB — HIV ANTIBODY (ROUTINE TESTING W REFLEX): HIV Screen 4th Generation wRfx: NONREACTIVE

## 2018-03-08 MED ORDER — SODIUM CHLORIDE 0.9 % IV SOLN: INTRAVENOUS | Status: DC

## 2018-03-08 MED ORDER — METHYLPREDNISOLONE SODIUM SUCC 125 MG IJ SOLR
60.0000 mg | Freq: Three times a day (TID) | INTRAMUSCULAR | Status: DC
  Administered 2018-03-08 – 2018-03-09 (×4): 60 mg via INTRAVENOUS
  Filled 2018-03-08 (×4): qty 2

## 2018-03-08 MED ORDER — SODIUM CHLORIDE 0.9 % IV SOLN
500.0000 mg | INTRAVENOUS | Status: DC
  Administered 2018-03-08 – 2018-03-09 (×2): 500 mg via INTRAVENOUS
  Filled 2018-03-08 (×3): qty 500

## 2018-03-08 MED ORDER — SODIUM CHLORIDE 0.9 % IV SOLN
2.0000 g | INTRAVENOUS | Status: DC
  Administered 2018-03-08 – 2018-03-09 (×2): 2 g via INTRAVENOUS
  Filled 2018-03-08 (×2): qty 20

## 2018-03-08 MED ORDER — IPRATROPIUM-ALBUTEROL 0.5-2.5 (3) MG/3ML IN SOLN
3.0000 mL | RESPIRATORY_TRACT | Status: DC
  Administered 2018-03-08 (×3): 3 mL via RESPIRATORY_TRACT
  Filled 2018-03-08 (×3): qty 3

## 2018-03-08 MED ORDER — LEVOFLOXACIN IN D5W 750 MG/150ML IV SOLN
750.0000 mg | INTRAVENOUS | Status: DC
  Administered 2018-03-08: 750 mg via INTRAVENOUS
  Filled 2018-03-08: qty 150

## 2018-03-08 MED ORDER — ENOXAPARIN SODIUM 40 MG/0.4ML ~~LOC~~ SOLN
40.0000 mg | SUBCUTANEOUS | Status: DC
  Administered 2018-03-08 – 2018-03-12 (×5): 40 mg via SUBCUTANEOUS
  Filled 2018-03-08 (×5): qty 0.4

## 2018-03-08 MED ORDER — LOPERAMIDE HCL 2 MG PO CAPS: 2.0000 mg | ORAL_CAPSULE | ORAL | Status: DC | PRN

## 2018-03-08 MED ORDER — INSULIN ASPART 100 UNIT/ML ~~LOC~~ SOLN
0.0000 [IU] | Freq: Three times a day (TID) | SUBCUTANEOUS | Status: DC
  Administered 2018-03-08: 3 [IU] via SUBCUTANEOUS

## 2018-03-08 MED ORDER — FLUTICASONE PROPIONATE 50 MCG/ACT NA SUSP
1.0000 | Freq: Every day | NASAL | Status: DC
  Administered 2018-03-08 – 2018-03-12 (×5): 1 via NASAL
  Filled 2018-03-08: qty 16

## 2018-03-08 MED ORDER — ARIPIPRAZOLE 5 MG PO TABS
5.0000 mg | ORAL_TABLET | Freq: Every day | ORAL | Status: DC
  Administered 2018-03-08 – 2018-03-12 (×5): 5 mg via ORAL
  Filled 2018-03-08 (×5): qty 1

## 2018-03-08 MED ORDER — INSULIN ASPART 100 UNIT/ML ~~LOC~~ SOLN
0.0000 [IU] | Freq: Three times a day (TID) | SUBCUTANEOUS | Status: DC
  Administered 2018-03-08 – 2018-03-09 (×3): 4 [IU] via SUBCUTANEOUS
  Administered 2018-03-09: 3 [IU] via SUBCUTANEOUS
  Administered 2018-03-11 (×2): 4 [IU] via SUBCUTANEOUS
  Administered 2018-03-11 – 2018-03-12 (×2): 3 [IU] via SUBCUTANEOUS
  Administered 2018-03-12: 4 [IU] via SUBCUTANEOUS

## 2018-03-08 MED ORDER — ORAL CARE MOUTH RINSE
15.0000 mL | Freq: Two times a day (BID) | OROMUCOSAL | Status: DC
  Administered 2018-03-08 – 2018-03-12 (×8): 15 mL via OROMUCOSAL

## 2018-03-08 MED ORDER — INSULIN ASPART 100 UNIT/ML ~~LOC~~ SOLN: 0.0000 [IU] | Freq: Every day | SUBCUTANEOUS | Status: DC

## 2018-03-08 MED ORDER — NICOTINE 21 MG/24HR TD PT24
21.0000 mg | MEDICATED_PATCH | Freq: Every day | TRANSDERMAL | Status: DC
  Administered 2018-03-08 – 2018-03-12 (×5): 21 mg via TRANSDERMAL
  Filled 2018-03-08 (×5): qty 1

## 2018-03-08 MED ORDER — ESCITALOPRAM OXALATE 20 MG PO TABS
20.0000 mg | ORAL_TABLET | Freq: Every day | ORAL | Status: DC
  Administered 2018-03-08 – 2018-03-12 (×5): 20 mg via ORAL
  Filled 2018-03-08 (×5): qty 1

## 2018-03-08 MED ORDER — ALPRAZOLAM 0.25 MG PO TABS
0.2500 mg | ORAL_TABLET | Freq: Once | ORAL | Status: AC
  Administered 2018-03-08: 0.25 mg via ORAL
  Filled 2018-03-08: qty 1

## 2018-03-08 MED ORDER — SODIUM CHLORIDE 0.9 % IV SOLN
INTRAVENOUS | Status: AC
  Administered 2018-03-08: 03:00:00 via INTRAVENOUS

## 2018-03-08 MED ORDER — IPRATROPIUM-ALBUTEROL 0.5-2.5 (3) MG/3ML IN SOLN
3.0000 mL | Freq: Four times a day (QID) | RESPIRATORY_TRACT | Status: DC
  Administered 2018-03-09 – 2018-03-12 (×13): 3 mL via RESPIRATORY_TRACT
  Filled 2018-03-08 (×13): qty 3

## 2018-03-08 MED ORDER — IPRATROPIUM-ALBUTEROL 0.5-2.5 (3) MG/3ML IN SOLN
3.0000 mL | Freq: Four times a day (QID) | RESPIRATORY_TRACT | Status: DC
  Administered 2018-03-08 (×2): 3 mL via RESPIRATORY_TRACT
  Filled 2018-03-08 (×2): qty 3

## 2018-03-08 NOTE — Progress Notes (Addendum)
Patient admitted after midnight with acute on chronc respiratory failure related to copd exacerbation.    PE Gen: sitting in chair eating  CV: rrr no mgr trace LE edema Resp: increased work of breathing with eating and conversation. BS quite diminished throughout. No wheeze no air movement Abd: soft +BS no guarding or rebounding   Plan: Change nebs to every 4 hours scheduled Continue solumedrol every 6 hours Add flutter valve Continue IV abx Monitor   Santiago Glad m. Black, NP  Not moving much air.  Increased work of breathing when talking.  Continue to monitor.  Wears 3L O2 at home but recently began smoking again so suspect this triggered exacerbation.  Eulogio Bear DO

## 2018-03-08 NOTE — Evaluation (Signed)
Occupational Therapy Evaluation Patient Details Name: Taylor [name] MRN: [id number] DOB: January 30, 1955 Today's Date: 03/08/2018    History of Present Illness Ptis a 63 yo male s/p COPD exacerbation with complaints of SOB. PMHx: COPD, emphysema on 3L continuous,  bipolar d/o, depression, prostate CA, colon sx.   Clinical Impression   PTA: living with a friend and independent prior. Pt currently experiencing dyspnea on 3L O2, but SpO2 levels remained >94% on 3lL throughout session. Pt set-upA for UB ADL and minA for LB ADL due to fatigue. Pt ambulating with no AD in room, fair balance. Pt was negative for orthostatics. Pt requires continued OT skilled services for energy conservation and to perform LB ADL without SOB. OT to follow acutely.    Follow Up Recommendations  No OT follow up;Supervision - Intermittent    Equipment Recommendations  None recommended by OT    Recommendations for Other Services       Precautions / Restrictions Precautions Precautions: Fall Precaution Comments: 3L O2 continuous Restrictions Weight Bearing Restrictions: No      Mobility Bed Mobility Overal bed mobility: Modified Independent                Transfers Overall transfer level: Needs assistance Equipment used: None Transfers: Sit to/from Stand;Stand Pivot Transfers Sit to Stand: Min guard              Balance Overall balance assessment: Needs assistance   Sitting balance-Leahy Scale: Good       Standing balance-Leahy Scale: Fair                             ADL either performed or assessed with clinical judgement   ADL Overall ADL's : Needs assistance/impaired Eating/Feeding: Modified independent   Grooming: Set up   Upper Body Bathing: Set up   Lower Body Bathing: Minimal assistance   Upper Body Dressing : Set up   Lower Body Dressing: Minimal assistance;Sitting/lateral leans;Sit to/from stand(Increased time)   Toilet Transfer:  Supervision/safety;Comfort height toilet   Toileting- Clothing Manipulation and Hygiene: Min guard;Sitting/lateral lean;Sit to/from stand       Functional mobility during ADLs: Min guard General ADL Comments: Pt requires increased time nad standing tolerance <2 mins at a time. Pt tolerating a few mins with rest breaks standing at sink for grooming. Pt requiring minA and increased time for LB ADL and set-upA for UB ADL.     Vision Baseline Vision/History: Wears glasses Vision Assessment?: No apparent visual deficits     Perception     Praxis      Pertinent Vitals/Pain Pain Assessment: No/denies pain     Hand Dominance Right   Extremity/Trunk Assessment Upper Extremity Assessment Upper Extremity Assessment: Overall WFL for tasks assessed   Lower Extremity Assessment Lower Extremity Assessment: Generalized weakness;Defer to PT evaluation   Cervical / Trunk Assessment Cervical / Trunk Assessment: Normal   Communication Communication Communication: No difficulties   Cognition Arousal/Alertness: Awake/alert Behavior During Therapy: WFL for tasks assessed/performed Overall Cognitive Status: Within Functional Limits for tasks assessed                                     General Comments  O2 >94% on 3L O2 (baseline); BP 129/82 sitting; standing 142/87    Exercises     Shoulder Instructions      Home Living Family/patient expects to  be discharged to:: Private residence(staying with a friend) Living Arrangements: Non-relatives/Friends Available Help at Discharge: Friend(s) Type of Home: House Home Access: Level entry     Home Layout: One level     Bathroom Shower/Tub: Teacher, early years/pre: Standard         Additional Comments:  only Dealer      Prior Functioning/Environment Level of Independence: Independent        Comments: Limited ambulator d/t DOE. Reports he has transportation to doctor appointments through  [facility]. Friend does grocery shopping, [name] his own meals; uses scooter at [employer]        OT Problem List: Decreased strength;Decreased activity tolerance      OT Treatment/Interventions: Self-care/ADL training;Therapeutic exercise;Neuromuscular education;Energy conservation;Therapeutic activities;Patient/family education;Balance training    OT Goals(Current goals can be found in the care plan section) Acute Rehab OT Goals Patient Stated Goal: "I want to not be gasping for air" OT Goal Formulation: With patient Time For Goal Achievement: 03/22/18 Potential to Achieve Goals: Fair ADL Goals Pt Will Perform Lower Body Dressing: with modified independence Additional ADL Goal #1: Pt will be able to state and use 3 energy conservation techniques to carry over to home environment.  OT Frequency: Min 2X/week   Barriers to D/C: Decreased caregiver support          Co-evaluation              AM-PAC OT "6 Clicks" Daily Activity     Outcome Measure Help from another person eating meals?: None Help from another person taking care of personal grooming?: None Help from another person toileting, which includes using toliet, bedpan, or urinal?: None Help from another person bathing (including washing, rinsing, drying)?: A Little Help from another person to put on and taking off regular upper body clothing?: None Help from another person to put on and taking off regular lower body clothing?: A Little 6 Click Score: 22   End of Session Equipment Utilized During Treatment: Gait belt Nurse Communication: Mobility status  Activity Tolerance: Patient tolerated treatment well;Patient limited by fatigue Patient left: in chair;with call bell/phone within reach;with nursing/sitter in room  OT Visit Diagnosis: Unsteadiness on feet (R26.81);Muscle weakness (generalized) (M62.81)                Time: 4097-3532 OT Time Calculation (min): 27 min Charges:  OT General Charges $OT Visit: 1  Visit OT Evaluation $OT Eval Moderate Complexity: 1 Mod OT Treatments $Self Care/Home Management : 8-22 mins  Ebony Hail Harold Hedge) Marsa Aris OTR/L Acute Rehabilitation Services Pager: [phone] Office: [phone]   Fredda Hammed 03/08/2018, 8:19 AM

## 2018-03-08 NOTE — H&P (Signed)
History and Physical    Taylor [name]:[id number] DOB: 1955/11/14 DOA: 03/07/2018  PCP: Clent Demark, PA-C Patient coming from: Home  Chief Complaint: Shortness of breath  HPI: Taylor [name] is a 63 y.o. male with medical history significant of asthma, COPD, emphysema, on 3 L home oxygen, bipolar disorder, depression, prostate cancer presenting to the hospital via EMS for evaluation of shortness of breath.  Patient reports having shortness of breath, cough productive of clear sputum, rhinorrhea, and decreased p.o. intake for the past few days.  Denies having any chest pain or wheezing.  Reports compliance with home COPD inhalers.  He continues to smoke 1 pack of cigarettes per day.  Reports having 3 episodes of nonbloody diarrhea since 2/15.  Denies having any abdominal pain, nausea, or vomiting.  Denies recent laxative use.  Review of Systems: As per HPI otherwise 10 point review of systems negative.  Past Medical History:  Diagnosis Date  . Asthma   . Bipolar 1 disorder ([place]) 02/05/2012  . Colon cancer ([place]) 04/11/11   adenocarcinoma of colon, 7/19 nodes pos.FINISHED CHEMO/DR. SHERRILL  . COPD (chronic obstructive pulmonary disease) ([place])    SMOKER  . Depression   . Dyspnea   . Emphysema of lung ([place])   . Full dentures   . Hemorrhoids   . On home oxygen therapy    "3L; 24/7" (05/29/2017)  . Oxygen deficiency   . Pneumonia ~ 2016   "double pneumonia"  . Prostate cancer ([place])    Gleason score = 7, supposed to have radiation therapy but he has not followed up (05/29/2017)  . Rib fractures    hx of    Past Surgical History:  Procedure Laterality Date  . COLON SURGERY  04/11/11   Sigmoid colectomy  . COLOSTOMY REVISION  04/11/2011   Procedure: COLON RESECTION SIGMOID;  Surgeon: [name], MD;  Location: [facility];  Service: General;  Laterality: N/A;  low anterior colon resection   . GOLD SEED IMPLANT N/A 02/24/2018   Procedure: GOLD SEED IMPLANT;  Surgeon:  [name], MD;  Location: [facility];  Service: Urology;  Laterality: N/A;  . HERNIA REPAIR    . INGUINAL HERNIA REPAIR Right 05/01/2012   Procedure: HERNIA REPAIR INGUINAL ADULT;  Surgeon: [name], MD;  Location: [facility];  Service: General;  Laterality: Right;  . INSERTION OF MESH Right 05/01/2012   Procedure: INSERTION OF MESH;  Surgeon: [name], MD;  Location: [facility];  Service: General;  Laterality: Right;  . PORT-A-CATH REMOVAL Left 05/01/2012   Procedure: REMOVAL Infusion Port;  Surgeon: [name], MD;  Location: [facility];  Service: General;  Laterality: Left;  . PORTACATH PLACEMENT  05/02/2011   Procedure: INSERTION PORT-A-CATH;  Surgeon: [name], MD;  Location: [facility];  Service: General;  Laterality: N/A;  . PROSTATE BIOPSY    . SPACE OAR INSTILLATION N/A 02/24/2018   Procedure: SPACE OAR INSTILLATION;  Surgeon: [name], MD;  Location: [facility];  Service: Urology;  Laterality: N/A;     reports that he quit smoking about 9 months ago. His smoking use included cigarettes. He has a 4.60 pack-year smoking history. He has quit using smokeless tobacco.  His smokeless tobacco use included chew. He reports previous alcohol use. He reports that he does not use drugs.  Allergies  Allergen Reactions  . Codeine Nausea And Vomiting    Family History  Problem Relation Age of Onset  . Heart disease Father   .  Lung cancer Maternal Uncle        smoked    Prior to Admission medications   Medication Sig Start Date End Date Taking? Authorizing Provider  albuterol (PROVENTIL) (2.5 MG/3ML) 0.083% nebulizer solution Take 3 mLs (2.5 mg total) by nebulization every 4 (four) hours as needed for wheezing or shortness of breath (if you can't catch your breath). 02/10/18   [name], MD  amoxicillin-clavulanate (AUGMENTIN) 875-125 MG tablet Take 1 tablet by mouth every 12 (twelve) hours. 02/12/18   Khatri, Hina, PA-C  ARIPiprazole (ABILIFY) 5 MG tablet Take 5 mg by mouth daily.     [provider]  benzonatate (TESSALON) 100 MG capsule Take 1 capsule (100 mg total) by mouth every 8 (eight) hours. Patient taking differently: Take 100 mg by mouth every 8 (eight) hours as needed for cough.  01/19/18   [name], MD  Blood Glucose Monitoring Suppl (ACCU-CHEK AVIVA) device Use as instructed 10/27/17 10/27/18  Clent Demark, PA-C  escitalopram (LEXAPRO) 20 MG tablet Take 20 mg by mouth daily.    [provider]  Fluticasone-Umeclidin-Vilant (TRELEGY ELLIPTA) 100-62.5-25 MCG/INH AEPB Inhale 1 puff into the lungs daily. 02/02/18   Ladell Pier, MD  folic acid (FOLVITE) 1 MG tablet Take 1 tablet (1 mg total) by mouth 3 (three) times a week. Patient not taking: Reported on 02/10/2018 01/19/18   [name], MD  glucose blood (ACCU-CHEK AVIVA) test strip 1 each by Other route as needed for other. Use as instructed 10/27/17   Clent Demark, PA-C  Lancets [facility]) lancets 1 each by Other route 2 (two) times daily. Use as instructed 10/27/17   Clent Demark, PA-C  lovastatin (MEVACOR) 20 MG tablet Take 1 tablet (20 mg total) by mouth at bedtime. 10/27/17   Clent Demark, PA-C  metFORMIN (GLUCOPHAGE) 1000 MG tablet Take 1 tablet (1,000 mg total) by mouth 2 (two) times daily with a meal. 10/27/17   Clent Demark, PA-C  OXYGEN Inhale 3 L into the lungs continuous.     [provider]  pantoprazole (PROTONIX) 40 MG tablet Take 1 tablet (40 mg total) by mouth daily. Patient not taking: Reported on 02/10/2018 01/19/18   [name], MD  polyethylene glycol (MIRALAX / GLYCOLAX) packet Take 17 g by mouth daily. Patient not taking: Reported on 02/19/2018 01/19/18   [name], MD  predniSONE (DELTASONE) 10 MG tablet Take 5 mg by mouth daily with breakfast.     [provider]  senna-docusate (SENOKOT-S) 8.6-50 MG tablet Take 1 tablet by mouth 2 (two) times daily. Patient not taking: Reported on 02/19/2018 01/19/18    [name], MD    Physical Exam: Vitals:   03/08/18 0030 03/08/18 0031 03/08/18 0145 03/08/18 0228  BP: 100/70  113/79 (!) 125/46  Pulse: 84  92 99  Resp: (!) 21  (!) 23 20  Temp:    98.1 F (36.7 C)  TempSrc:    Oral  SpO2:  94% 95% 97%    Physical Exam  Constitutional: He is oriented to person, place, and time. He appears well-developed and well-nourished. No distress.  HENT:  Head: Normocephalic.  Mouth/Throat: Oropharynx is clear and moist.  Eyes: Right eye exhibits no discharge. Left eye exhibits no discharge.  Neck: Neck supple.  Cardiovascular: Normal rate, regular rhythm and intact distal pulses.  Pulmonary/Chest: Breath sounds normal. He has no wheezes. He has no rales.  On 3 L supplemental oxygen (same as home  requirement)  Abdominal: Soft. Bowel sounds are normal. He exhibits distension. There is no abdominal tenderness. There is no rebound and no guarding.  Musculoskeletal:        General: No edema.  Neurological: He is alert and oriented to person, place, and time.  Speech fluent, tongue midline, no facial droop Moving all extremities spontaneously.  Sensation to light touch intact throughout.  Skin: Skin is warm and dry. He is not diaphoretic.  Psychiatric: He has a normal mood and affect. His behavior is normal.     Labs on Admission: I have personally reviewed following labs and imaging studies  CBC: Recent Labs  Lab 03/07/18 2225  WBC 9.1  NEUTROABS 6.8  HGB 13.1  HCT 40.9  MCV 90.5  PLT 151   Basic Metabolic Panel: Recent Labs  Lab 03/07/18 2225  NA 130*  K 4.0  CL 92*  CO2 26  GLUCOSE 139*  BUN 14  CREATININE 0.92  CALCIUM 8.7*   GFR: Estimated Creatinine Clearance: 87.5 mL/min (by C-G formula based on SCr of 0.92 mg/dL). Liver Function Tests: Recent Labs  Lab 03/07/18 2225  AST 25  ALT 20  ALKPHOS 48  BILITOT 0.5  PROT 7.1  ALBUMIN 4.5   No results for input(s): LIPASE, AMYLASE in the last 168 hours. No results for  input(s): AMMONIA in the last 168 hours. Coagulation Profile: No results for input(s): INR, PROTIME in the last 168 hours. Cardiac Enzymes: Recent Labs  Lab 03/07/18 2225  TROPONINI <0.03   BNP (last 3 results) No results for input(s): PROBNP in the last 8760 hours. HbA1C: No results for input(s): HGBA1C in the last 72 hours. CBG: No results for input(s): GLUCAP in the last 168 hours. Lipid Profile: No results for input(s): CHOL, HDL, LDLCALC, TRIG, CHOLHDL, LDLDIRECT in the last 72 hours. Thyroid Function Tests: No results for input(s): TSH, T4TOTAL, FREET4, T3FREE, THYROIDAB in the last 72 hours. Anemia Panel: No results for input(s): VITAMINB12, FOLATE, FERRITIN, TIBC, IRON, RETICCTPCT in the last 72 hours. Urine analysis:    Component Value Date/Time   COLORURINE YELLOW 01/17/2018 [place] 01/17/2018 1731   LABSPEC 1.036 (H) 01/17/2018 1731   PHURINE 5.0 01/17/2018 1731   GLUCOSEU >=500 (A) 01/17/2018 1731   HGBUR NEGATIVE 01/17/2018 1731   BILIRUBINUR NEGATIVE 01/17/2018 1731   KETONESUR 5 (A) 01/17/2018 1731   PROTEINUR NEGATIVE 01/17/2018 1731   UROBILINOGEN 1.0 01/09/2013 1843   NITRITE NEGATIVE 01/17/2018 1731   LEUKOCYTESUR NEGATIVE 01/17/2018 1731    Radiological Exams on Admission: Dg Chest Portable 1 View  Result Date: 03/07/2018 CLINICAL DATA:  Shortness of breath 2 days worse today. Abdominal distension with pain and diarrhea. EXAM: PORTABLE CHEST 1 VIEW COMPARISON:  02/12/2018 FINDINGS: Lungs are somewhat hyperexpanded with increased lucency over the mid to upper lungs compatible with emphysematous disease. No focal airspace consolidation or effusion. Cardiomediastinal silhouette and remainder of the exam is unchanged. IMPRESSION: No acute findings. Emphysematous disease. Electronically Signed   By: Marin Olp M.D.   On: 03/07/2018 23:00    EKG: Independently reviewed.  Sinus tachycardia, baseline wander in V6.  No significant change  since prior tracing.  Assessment/Plan Principal Problem:   COPD exacerbation (HCC) Active Problems:   Type 2 diabetes mellitus (HCC)   Near syncope   Diarrhea   Hyponatremia   Acute exacerbation of COPD -Likely precipitated by viral URI.  Patient reports compliance with home inhalers. -Patient found to be wheezing with decreased air entry  and tachypneic by EMS.  Placed on BiPAP. His work of breathing improved and he was transitioned to 3 L oxygen via nasal cannula (same as home requirement). -Chest x-ray showing no acute findings; evidence of emphysematous disease. -Influenza panel negative -Received albuterol, Atrovent, magnesium 2 g, and Solu-Medrol 125 mg by EMS. -Continue Solu-Medrol 60 mg every 8 hours starting early morning.   -Scheduled duo nebs every 6 hours -IV Levaquin 750 mg daily -Flonase nasal spray -Supplemental oxygen -Incentive spirometry -Check pulse ox with ambulation  Near syncope Per conversation with the ED provider, patient reported a near syncopal episode.  He did not give any history of near syncope to me. -Maybe orthostatic, does report decreased p.o. intake and diarrhea.  Check orthostatics.  Continue IV fluid hydration. -ACS less likely as troponin negative and EKG without acute ischemic changes.  Patient is not complaining of chest pain and appears comfortable on exam. -Infection less likely as patient is afebrile and does not have leukocytosis. -Stroke less likely as no gross focal neuro deficits appreciated. -Cardiac monitoring -Echo  Diarrhea -Afebrile and no leukocytosis.  LFTs normal.  Patient denies having any abdominal pain, nausea, or vomiting.  Abdomen appears distended on exam but nontender. -Loperamide as needed -IV fluid hydration -Patient has not had any episodes of diarrhea in the hospital.  If he does, order GI pathogen panel.  Mild hyponatremia Likely secondary to poor oral intake.  Corrected sodium 131. -IV fluid  hydration -Continue to monitor BMP  Prostate cancer -Followed by outpatient urology.  Please ensure follow-up after discharge.  Tobacco use Continues to smoke 1 pack of cigarettes per day.  I counseled the patient on smoking cessation but he appears to be in the pre-contemplative stage at this time. -NicoDerm patch -He will need continued counseling.   Type 2 diabetes -A1c 7.7 on 1/30.  Sliding scale insulin with bedtime coverage in the setting of steroid use. CBG checks.  Unable to safely order home medications at this time as pharmacy medication reconciliation is pending.  DVT prophylaxis: Lovenox Code Status: Patient wishes to be full code. Family Communication: No family available. Disposition Plan: Anticipate discharge after clinical improvement. Consults called: None Admission status: Observation, telemetry   Shela Leff MD [facility] Pager [phone]  If 7PM-7AM, please contact night-coverage [email or web address] Password [email or web address]  03/08/2018, 2:30 AM

## 2018-03-08 NOTE — ED Notes (Signed)
Pt removed from bipap, placed on 3L [place]. Work of breathing has improved, pt resting comfortably at this time.

## 2018-03-08 NOTE — Evaluation (Signed)
Physical Therapy Evaluation Patient Details Name: Taylor [name] MRN: [id number] DOB: 12-08-1955 Today's Date: 03/08/2018   History of Present Illness  Ptis a 63 yo male s/p COPD exacerbation with complaints of SOB. PMHx: COPD, emphysema on 3L continuous,  bipolar d/o, depression, prostate CA, colon sx.  Clinical Impression  Pt admitted with above diagnosis. Pt currently with functional limitations due to the deficits listed below (see PT Problem List). Pt ambulated 200' on 3L O2 with O2 sats in mid 90's but HR up as high as 133 bpm and pt needed to stop every 50' for standing rest break in addition to being unable to converse while walking due to SOB. Pt also reports he has not been eating because he gets so SOB with chewing and swallowing that he is afraid he will pass out. Planned to practice steps but did not feel that pt was stable enough at this point.  Pt will benefit from skilled PT to increase their independence and safety with mobility to allow discharge to the venue listed below.       Follow Up Recommendations Home health PT    Equipment Recommendations  None recommended by PT    Recommendations for Other Services       Precautions / Restrictions Precautions Precautions: Fall Precaution Comments: 3L O2 continuous Restrictions Weight Bearing Restrictions: No      Mobility  Bed Mobility Overal bed mobility: Modified Independent                Transfers Overall transfer level: Needs assistance Equipment used: None Transfers: Sit to/from Stand Sit to Stand: Min guard         General transfer comment: min-guard for safety  Ambulation/Gait Ambulation/Gait assistance: Min guard Gait Distance (Feet): 200 Feet Assistive device: None Gait Pattern/deviations: Step-through pattern;Decreased stride length Gait velocity: decreased Gait velocity interpretation: 1.31 - 2.62 ft/sec, indicative of limited community ambulator General Gait Details: no LOB but pt  fatigued quickly and needed standing rest break ~every 50'. Could not converse while ambulating due to SOB. SpO2 94% on 3L O2 but HR up as high as 133 bpm.   Stairs            Wheelchair Mobility    Modified Rankin (Stroke Patients Only)       Balance Overall balance assessment: Needs assistance Sitting-balance support: No upper extremity supported Sitting balance-Leahy Scale: Good     Standing balance support: No upper extremity supported Standing balance-Leahy Scale: Fair Standing balance comment: limitations due to fatigue                             Pertinent Vitals/Pain Pain Assessment: No/denies pain    Home Living Family/patient expects to be discharged to:: Private residence Living Arrangements: Non-relatives/Friends Available Help at Discharge: Friend(s) Type of Home: House Home Access: Stairs to enter   Technical brewer of Steps: 3 Home Layout: One level Home Equipment: Shower seat - built in Additional Comments:  only Dealer    Prior Function Level of Independence: Independent         Comments: Limited ambulator d/t DOE. Reports he has transportation to doctor appointments through [facility] Friend does grocery shopping, cooks his own meals; uses scooter at [place]: Right    Extremity/Trunk Assessment        Lower Extremity Assessment Lower Extremity Assessment: Generalized weakness    Cervical /  Trunk Assessment Cervical / Trunk Assessment: Normal  Communication   Communication: Other (comment)(difficulty completing sentences due to SOB)  Cognition Arousal/Alertness: Awake/alert Behavior During Therapy: WFL for tasks assessed/performed Overall Cognitive Status: Within Functional Limits for tasks assessed                                        General Comments General comments (skin integrity, edema, etc.): pt reports he hasn't been eating lately because  he gets so SOB while chewing and swallowing. Tremor noted which pt reports has been present since chemo. Pt reports he gets to weak at times he feels he will pass out.     Exercises     Assessment/Plan    PT Assessment Patient needs continued PT services  PT Problem List Decreased activity tolerance;Decreased mobility;Cardiopulmonary status limiting activity       PT Treatment Interventions Stair training;Functional mobility training;Therapeutic activities;Therapeutic exercise;Gait training;Balance training;Neuromuscular re-education;Patient/family education    PT Goals (Current goals can be found in the Care Plan section)  Acute Rehab PT Goals Patient Stated Goal: "I want to not be gasping for air" PT Goal Formulation: With patient Time For Goal Achievement: 03/22/18 Potential to Achieve Goals: Fair    Frequency Min 3X/week   Barriers to discharge        Co-evaluation               AM-PAC PT "6 Clicks" Mobility  Outcome Measure Help needed turning from your back to your side while in a flat bed without using bedrails?: None Help needed moving from lying on your back to sitting on the side of a flat bed without using bedrails?: None Help needed moving to and from a bed to a chair (including a wheelchair)?: A Little Help needed standing up from a chair using your arms (e.g., wheelchair or bedside chair)?: A Little Help needed to walk in hospital room?: A Little Help needed climbing 3-5 steps with a railing? : A Lot 6 Click Score: 19    End of Session Equipment Utilized During Treatment: Gait belt;Oxygen Activity Tolerance: Patient limited by fatigue Patient left: in bed;with call bell/phone within reach Nurse Communication: Mobility status PT Visit Diagnosis: Muscle weakness (generalized) (M62.81);Difficulty in walking, not elsewhere classified (R26.2)    Time: 6387-5643 PT Time Calculation (min) (ACUTE ONLY): 13 min   Charges:   PT Evaluation $PT Eval  Moderate Complexity: [place]  Pager [phone] Office [place] 03/08/2018, 4:33 PM

## 2018-03-08 NOTE — Progress Notes (Signed)
  Echocardiogram 2D Echocardiogram has been performed.  Technically difficult study due to small rib spacing.   Aadan Chenier L Androw 03/08/2018, 10:49 AM

## 2018-03-08 NOTE — Progress Notes (Signed)
Pt was started on Levaquin for his COPD exacerbation last PM. He has no PCN allergy. Dr Eliseo Squires is ok with changing his abx to Ceftriaxone/azith with the same stop date of 5d to avoid using quinolones.  Dc levaquin Ceftriaxone 2g IV q24 x4 Azith 500mg  IV q24 x4  Onnie Boer, PharmD, [place], AAHIVP, CPP Infectious Disease Pharmacist 03/08/2018 12:16 PM

## 2018-03-09 DIAGNOSIS — Z85038 Personal history of other malignant neoplasm of large intestine: Secondary | ICD-10-CM | POA: Diagnosis not present

## 2018-03-09 DIAGNOSIS — Z590 Homelessness: Secondary | ICD-10-CM | POA: Diagnosis not present

## 2018-03-09 DIAGNOSIS — F419 Anxiety disorder, unspecified: Secondary | ICD-10-CM | POA: Diagnosis present

## 2018-03-09 DIAGNOSIS — F319 Bipolar disorder, unspecified: Secondary | ICD-10-CM | POA: Diagnosis present

## 2018-03-09 DIAGNOSIS — J439 Emphysema, unspecified: Secondary | ICD-10-CM | POA: Diagnosis present

## 2018-03-09 DIAGNOSIS — I951 Orthostatic hypotension: Secondary | ICD-10-CM | POA: Diagnosis present

## 2018-03-09 DIAGNOSIS — I10 Essential (primary) hypertension: Secondary | ICD-10-CM | POA: Diagnosis present

## 2018-03-09 DIAGNOSIS — J441 Chronic obstructive pulmonary disease with (acute) exacerbation: Secondary | ICD-10-CM | POA: Diagnosis not present

## 2018-03-09 DIAGNOSIS — J9622 Acute and chronic respiratory failure with hypercapnia: Secondary | ICD-10-CM | POA: Diagnosis present

## 2018-03-09 DIAGNOSIS — Z720 Tobacco use: Secondary | ICD-10-CM

## 2018-03-09 DIAGNOSIS — Z9981 Dependence on supplemental oxygen: Secondary | ICD-10-CM | POA: Diagnosis not present

## 2018-03-09 DIAGNOSIS — C61 Malignant neoplasm of prostate: Secondary | ICD-10-CM | POA: Diagnosis present

## 2018-03-09 DIAGNOSIS — Z7984 Long term (current) use of oral hypoglycemic drugs: Secondary | ICD-10-CM | POA: Diagnosis not present

## 2018-03-09 DIAGNOSIS — R197 Diarrhea, unspecified: Secondary | ICD-10-CM | POA: Diagnosis present

## 2018-03-09 DIAGNOSIS — E871 Hypo-osmolality and hyponatremia: Secondary | ICD-10-CM

## 2018-03-09 DIAGNOSIS — R55 Syncope and collapse: Secondary | ICD-10-CM | POA: Diagnosis not present

## 2018-03-09 DIAGNOSIS — E1169 Type 2 diabetes mellitus with other specified complication: Secondary | ICD-10-CM

## 2018-03-09 DIAGNOSIS — Z9221 Personal history of antineoplastic chemotherapy: Secondary | ICD-10-CM | POA: Diagnosis not present

## 2018-03-09 DIAGNOSIS — Z79899 Other long term (current) drug therapy: Secondary | ICD-10-CM | POA: Diagnosis not present

## 2018-03-09 DIAGNOSIS — Z8249 Family history of ischemic heart disease and other diseases of the circulatory system: Secondary | ICD-10-CM | POA: Diagnosis not present

## 2018-03-09 DIAGNOSIS — Z885 Allergy status to narcotic agent status: Secondary | ICD-10-CM | POA: Diagnosis not present

## 2018-03-09 DIAGNOSIS — J9621 Acute and chronic respiratory failure with hypoxia: Secondary | ICD-10-CM | POA: Diagnosis present

## 2018-03-09 DIAGNOSIS — Z7952 Long term (current) use of systemic steroids: Secondary | ICD-10-CM | POA: Diagnosis not present

## 2018-03-09 DIAGNOSIS — E119 Type 2 diabetes mellitus without complications: Secondary | ICD-10-CM | POA: Diagnosis present

## 2018-03-09 DIAGNOSIS — Z801 Family history of malignant neoplasm of trachea, bronchus and lung: Secondary | ICD-10-CM | POA: Diagnosis not present

## 2018-03-09 DIAGNOSIS — F17210 Nicotine dependence, cigarettes, uncomplicated: Secondary | ICD-10-CM | POA: Diagnosis present

## 2018-03-09 LAB — CBC
HCT: 37.5 % — ABNORMAL LOW (ref 39.0–52.0)
Hemoglobin: 12.3 g/dL — ABNORMAL LOW (ref 13.0–17.0)
MCH: 29.9 pg (ref 26.0–34.0)
MCHC: 32.8 g/dL (ref 30.0–36.0)
MCV: 91.2 fL (ref 80.0–100.0)
Platelets: 265 10*3/uL (ref 150–400)
RBC: 4.11 MIL/uL — ABNORMAL LOW (ref 4.22–5.81)
RDW: 13.1 % (ref 11.5–15.5)
WBC: 10.1 10*3/uL (ref 4.0–10.5)
nRBC: 0 % (ref 0.0–0.2)

## 2018-03-09 LAB — BASIC METABOLIC PANEL
Anion gap: 9 (ref 5–15)
BUN: 17 mg/dL (ref 8–23)
CO2: 33 mmol/L — ABNORMAL HIGH (ref 22–32)
Calcium: 8.9 mg/dL (ref 8.9–10.3)
Chloride: 99 mmol/L (ref 98–111)
Creatinine, Ser: 0.91 mg/dL (ref 0.61–1.24)
GFR calc Af Amer: >60 mL/min (ref >60)
GFR calc non Af Amer: >60 mL/min (ref >60)
Glucose, Bld: 191 mg/dL — ABNORMAL HIGH (ref 70–99)
POTASSIUM: 4.6 mmol/L (ref 3.5–5.1)
Sodium: 141 mmol/L (ref 135–145)

## 2018-03-09 LAB — GLUCOSE, CAPILLARY
GLUCOSE-CAPILLARY: 133 mg/dL — AB (ref 70–99)
Glucose-Capillary: 193 mg/dL — ABNORMAL HIGH (ref 70–99)
Glucose-Capillary: 189 mg/dL — ABNORMAL HIGH (ref 70–99)

## 2018-03-09 MED ORDER — METHYLPREDNISOLONE SODIUM SUCC 125 MG IJ SOLR
60.0000 mg | Freq: Two times a day (BID) | INTRAMUSCULAR | Status: AC
  Administered 2018-03-09: 60 mg via INTRAVENOUS
  Filled 2018-03-09: qty 2

## 2018-03-09 MED ORDER — ALPRAZOLAM 0.25 MG PO TABS
0.2500 mg | ORAL_TABLET | Freq: Once | ORAL | Status: AC
  Administered 2018-03-09: 0.25 mg via ORAL
  Filled 2018-03-09: qty 1

## 2018-03-09 MED ORDER — PREDNISONE 20 MG PO TABS
40.0000 mg | ORAL_TABLET | Freq: Every day | ORAL | Status: DC
  Administered 2018-03-10 – 2018-03-12 (×3): 40 mg via ORAL
  Filled 2018-03-09 (×3): qty 2

## 2018-03-09 NOTE — Plan of Care (Signed)

## 2018-03-09 NOTE — Progress Notes (Signed)
Physical Therapy Treatment Patient Details Name: Taylor [name] MRN: [id number] DOB: 06-10-1955 Today's Date: 03/09/2018    History of Present Illness Ptis a 63 yo male s/p COPD exacerbation with complaints of SOB. PMHx: COPD, emphysema on 3L continuous,  bipolar d/o, depression, prostate CA, colon sx.    PT Comments    Patient received in bed, agrees to PT after he uses restroom. Patient requires frequent rest breaks throughout session due to SOB and increased HR. Patient performs bed mobility and transfers independently. He is able to ambulate 200 feet with multiple standing rest breaks due to SOB.  Patient will benefit from continued PT to address his decreased activity tolerance and endurance, education regarding energy conservation.          Follow Up Recommendations  Home health PT     Equipment Recommendations  None recommended by PT    Recommendations for Other Services       Precautions / Restrictions Precautions Precautions: Fall Precaution Comments: 3L O2 continuous Restrictions Weight Bearing Restrictions: No    Mobility  Bed Mobility Overal bed mobility: Independent                Transfers Overall transfer level: Independent Equipment used: None Transfers: Sit to/from Stand Sit to Stand: Independent            Ambulation/Gait Ambulation/Gait assistance: Supervision;Min guard Gait Distance (Feet): 200 Feet Assistive device: None Gait Pattern/deviations: WFL(Within Functional Limits) Gait velocity: decreased   General Gait Details: patient requires multiple standing rest breaks due to fatigue and SOB. O2 sats remained > 90% throughout session. HR increases to 130s with walking.    Stairs             Wheelchair Mobility    Modified Rankin (Stroke Patients Only)       Balance Overall balance assessment: No apparent balance deficits (not formally assessed) Sitting-balance support: Feet supported Sitting balance-Leahy  Scale: Good     Standing balance support: No upper extremity supported Standing balance-Leahy Scale: Good                              Cognition Arousal/Alertness: Awake/alert Behavior During Therapy: WFL for tasks assessed/performed Overall Cognitive Status: Within Functional Limits for tasks assessed                                        Exercises      General Comments        Pertinent Vitals/Pain Pain Assessment: No/denies pain    Home Living                      Prior Function            PT Goals (current goals can now be found in the care plan section) Acute Rehab PT Goals Patient Stated Goal: "I want to not be gasping for air" PT Goal Formulation: With patient Time For Goal Achievement: 03/22/18 Potential to Achieve Goals: Fair Progress towards PT goals: Progressing toward goals    Frequency    Min 3X/week      PT Plan Current plan remains appropriate    Co-evaluation              AM-PAC PT "6 Clicks" Mobility   Outcome Measure  Help needed turning from your back to your  side while in a flat bed without using bedrails?: None Help needed moving from lying on your back to sitting on the side of a flat bed without using bedrails?: None Help needed moving to and from a bed to a chair (including a wheelchair)?: None Help needed standing up from a chair using your arms (e.g., wheelchair or bedside chair)?: A Little Help needed to walk in hospital room?: A Little Help needed climbing 3-5 steps with a railing? : A Little 6 Click Score: 21    End of Session Equipment Utilized During Treatment: Gait belt;Oxygen Activity Tolerance: Patient limited by fatigue Patient left: in bed;with call bell/phone within reach Nurse Communication: Mobility status PT Visit Diagnosis: Muscle weakness (generalized) (M62.81);Difficulty in walking, not elsewhere classified (R26.2)     Time: 1345-1410 PT Time Calculation (min)  (ACUTE ONLY): 25 min  Charges:  $Gait Training: 23-37 mins                     [employer], PT, [facility] 03/09/18,2:22 PM

## 2018-03-09 NOTE — Progress Notes (Signed)
Progress Note    Taylor [name]  GEZ:[id number] DOB: 1955/10/02  DOA: 03/07/2018 PCP: Clent Demark, PA-C    Brief Narrative:   Chief complaint: Shortness of breath  Medical records reviewed and are as summarized below:  Taylor [name] is an 63 y.o. 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  Assessment/Plan:   Principal Problem:   Acute on chronic respiratory failure with hypoxia (HCC) Active Problems:   COPD exacerbation (HCC)   Tobacco abuse   Prostate cancer (HCC)   Chronic respiratory failure with hypoxia and hypercapnia (HCC)   Type 2 diabetes mellitus ([place])   Essential hypertension   Near syncope   Diarrhea   Hyponatremia  COPD exacerbation, hypoxic respiratory failure: Acute on chronic.  At baseline patient on 3 L of nasal cannula oxygen at home.  Patient noted to be acutely wheezing and chest x-ray showing no acute findings except for emphysematous disease.  Influenza screen was negative.  Patient has been started on scheduled breathing treatments and given IV Solu-Medrol. -Continue nasal cannula oxygen of 3 L -Decrease IV Solu-Medrol to 60 mg twice daily, and start p.o. prednisone in a.m. of 40 mg -Continue scheduled breathing treatments -Levaquin changed to IV Rocephin and azithromycin -Continue flutter valve -Will need to continue outpatient follow-up with Dr. Melvyn Novas follow-up appointment scheduled for March 9  Near syncope: Suspected likely to be related to orthostatic hypotension with reports of diarrhea.  Troponins were negative and EKG did not show any significant ischemic changes.  Patient was started on IV fluid hydration with improvement of symptoms.  Echocardiogram revealed EF 60-65%.  See full details below.  No significant arrhythmias noted on EKG.  Diabetes mellitus type 2: Last hemoglobin A1c 7.7 on 1/30. -Hypoglycemic  protocols -Continue CBGs q. before meals and at bedtime with SSI  Diarrhea: Resolved.  Patient denied any abdominal pain, nausea, or vomiting symptoms.  Physical exam was otherwise noted to be benign.  Hyponatremia: Resolved.  On admission sodium noted to be as low as 130, but symptoms resolved with IV fluids.  History of prostate cancer: Patient recommended to continue outpatient follow-up with urology at discharge.  Tobacco use: Patient continues to smoke 1 pack cigarettes per day on average.  Counseled patient on need of cessation of tobacco use. -Continue nicotine patch   Body mass index is 26.11 kg/m.   Family Communication/Anticipated D/C date and plan/Code Status   DVT prophylaxis: Lovenox ordered. Code Status: Full Code.  Family Communication: No family present at bedside Disposition Plan: Likely discharge home tomorrow   Medical Consultants:    None.   Anti-Infectives:    Rocephin and azithromycin: Day 2  Subjective:   Patient reports that he has been on steroids for some time and follows with Dr. Melvyn Novas of pulmonology.  His next appointment is scheduled for March 9.  Objective:    Vitals:   03/09/18 0825 03/09/18 1059 03/09/18 1416 03/09/18 1516  BP: 125/86     Pulse: 94     Resp: 18     Temp: 98.7 F (37.1 C)     TempSrc:      SpO2: 98% 95% 93% 94%  Weight:      Height:        Intake/Output Summary (Last 24 hours) at 03/09/2018 1619 Last data filed at 03/09/2018 0941 Gross per 24 hour  Intake 600 ml  Output 1900 ml  Net -  1300 ml   Filed Weights   03/08/18 0228  Weight: 84.9 kg    Exam: Constitutional: NAD, calm, comfortable Eyes: PERRL, lids and conjunctivae normal ENMT: Mucous membranes are moist. Posterior pharynx clear of any exudate or lesions.  Neck: normal, supple, no masses, no thyromegaly Respiratory: Decreased overall aeration with minimal wheeze noted.  Currently on 3 L of nasal cannula oxygen.  Currently with pursed lip  breathing. Cardiovascular: Regular rate and rhythm, no murmurs / rubs / gallops. No extremity edema. 2+ pedal pulses. No carotid bruits.  Abdomen: no tenderness, no masses palpated. No hepatosplenomegaly. Bowel sounds positive.  Musculoskeletal: no clubbing / cyanosis. No joint deformity upper and lower extremities. Good ROM, no contractures. Normal muscle tone.  Skin: no rashes, lesions, ulcers. No induration Neurologic: CN 2-12 grossly intact. Sensation intact, DTR normal. Strength 5/5 in all 4.  Psychiatric: Normal judgment and insight. Alert and oriented x 3. Normal mood.    Data Reviewed:   I have personally reviewed following labs and imaging studies:  Labs: Labs show the following:   Basic Metabolic Panel: Recent Labs  Lab 03/07/18 2225 03/08/18 0302 03/09/18 0336  NA 130* 135 141  K 4.0 4.1 4.6  CL 92* 92* 99  CO2 26 30 33*  GLUCOSE 139* 227* 191*  BUN 14 13 17   CREATININE 0.92 0.98 0.91  CALCIUM 8.7* 9.0 8.9   GFR Estimated Creatinine Clearance: 88.5 mL/min (by C-G formula based on SCr of 0.91 mg/dL). Liver Function Tests: Recent Labs  Lab 03/07/18 2225  AST 25  ALT 20  ALKPHOS 48  BILITOT 0.5  PROT 7.1  ALBUMIN 4.5   No results for input(s): LIPASE, AMYLASE in the last 168 hours. No results for input(s): AMMONIA in the last 168 hours. Coagulation profile No results for input(s): INR, PROTIME in the last 168 hours.  CBC: Recent Labs  Lab 03/07/18 2225 03/09/18 0336  WBC 9.1 10.1  NEUTROABS 6.8  --   HGB 13.1 12.3*  HCT 40.9 37.5*  MCV 90.5 91.2  PLT 311 265   Cardiac Enzymes: Recent Labs  Lab 03/07/18 2225  TROPONINI <0.03   BNP (last 3 results) No results for input(s): PROBNP in the last 8760 hours. CBG: Recent Labs  Lab 03/08/18 1215 03/08/18 1703 03/08/18 2056 03/09/18 0820 03/09/18 1203  GLUCAP 161* 114* 92 193* 189*   D-Dimer: No results for input(s): DDIMER in the last 72 hours. Hgb A1c: No results for input(s): HGBA1C  in the last 72 hours. Lipid Profile: No results for input(s): CHOL, HDL, LDLCALC, TRIG, CHOLHDL, LDLDIRECT in the last 72 hours. Thyroid function studies: No results for input(s): TSH, T4TOTAL, T3FREE, THYROIDAB in the last 72 hours.  Invalid input(s): FREET3 Anemia work up: No results for input(s): VITAMINB12, FOLATE, FERRITIN, TIBC, IRON, RETICCTPCT in the last 72 hours. Sepsis Labs: Recent Labs  Lab 03/07/18 2225 03/09/18 0336  WBC 9.1 10.1    Microbiology No results found for this or any previous visit (from the past 240 hour(s)).  Procedures and diagnostic studies:    Dg Chest Portable 1 View  Result Date: 03/07/2018 CLINICAL DATA:  Shortness of breath 2 days worse today. Abdominal distension with pain and diarrhea. EXAM: PORTABLE CHEST 1 VIEW COMPARISON:  02/12/2018 FINDINGS: Lungs are somewhat hyperexpanded with increased lucency over the mid to upper lungs compatible with emphysematous disease. No focal airspace consolidation or effusion. Cardiomediastinal silhouette and remainder of the exam is unchanged. IMPRESSION: No acute findings. Emphysematous disease. Electronically Signed  By: Marin Olp M.D.   On: 03/07/2018 23:00   Dg Abd 2 Views  Result Date: 03/08/2018 CLINICAL DATA:  Abdominal distension and diarrhea. EXAM: ABDOMEN - 2 VIEW COMPARISON:  None. FINDINGS: The bowel gas pattern is normal. There is no evidence of free air. No radio-opaque calculi or other significant radiographic abnormality is seen. IMPRESSION: No bowel obstruction. Electronically Signed   By: Abelardo Diesel M.D.   On: 03/08/2018 03:42   Echocardiogram 03/08/2018: Impression 1. The left ventricle has normal systolic function with an ejection fraction of 60-65%. The cavity size was normal. Mild basal septal hypertrophy. Left ventricular diastolic parameters were normal No evidence of left ventricular regional wall motion  abnormalities.  2. The right ventricle has normal systolic function. The  cavity was normal. There is no increase in right ventricular wall thickness.  3. The mitral valve is normal in structure.  4. The tricuspid valve is normal in structure.  5. The aortic valve is normal in structure. Aortic valve regurgitation is trivial by color flow Doppler.  6. The pulmonic valve was normal in structure.  7. No evidence of left ventricular regional wall motion abnormalities.  8. Right atrial pressure is estimated at 8 mmHg.  9. The interatrial septum appears to be lipomatous.  Medications:   . ARIPiprazole  5 mg Oral Daily  . enoxaparin (LOVENOX) injection  40 mg Subcutaneous Q24H  . escitalopram  20 mg Oral Daily  . fluticasone  1 spray Each Nare Daily  . insulin aspart  0-20 Units Subcutaneous TID WC  . insulin aspart  0-5 Units Subcutaneous QHS  . ipratropium-albuterol  3 mL Nebulization QID  . mouth rinse  15 mL Mouth Rinse BID  . methylPREDNISolone (SOLU-MEDROL) injection  60 mg Intravenous Q12H  . nicotine  21 mg Transdermal Daily  . [START ON 03/10/2018] predniSONE  40 mg Oral Q breakfast   Continuous Infusions: . azithromycin 500 mg (03/08/18 2309)  . cefTRIAXone (ROCEPHIN)  IV 2 g (03/08/18 5465)     LOS: 0 days   Rondell A Smith  [facility]   *Please refer to [email or web address], password [email or web address] to get updated schedule on who will round on this patient, as hospitalists switch teams weekly. If 7PM-7AM, please contact night-coverage at [email or web address], password [email or web address] for any overnight needs.
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
## 2018-03-09 NOTE — Progress Notes (Signed)
Occupational Therapy Treatment Patient Details Name: Taylor [name] MRN: [id number] DOB: April 24, 1955 Today's Date: 03/09/2018    History of present illness Ptis a 63 yo male s/p COPD exacerbation with complaints of SOB. PMHx: COPD, emphysema on 3L continuous,  bipolar d/o, depression, prostate CA, colon sx.   OT comments  Pt received upright in recliner having just finished breathing treatment. Pt reports increased anxiety and SOB post ambulation with PT, RN present upon arrival having just administered Xanax.  Provided education and hand outs on Energy Conservation as pt reporting overwhelmed with current status and expressing desire to be as independent as possible.  Pt reports utilizing some energy conservation strategies already and able to verbalize these to therapist, however pleased with additional recommendations.  Pt will continue to benefit from OT acutely to increase activity tolerance/endurance during ADLs and functional mobility.  Follow Up Recommendations  No OT follow up;Supervision - Intermittent    Equipment Recommendations  None recommended by OT       Precautions / Restrictions Precautions Precautions: Fall Precaution Comments: 3L O2 continuous Restrictions Weight Bearing Restrictions: No       Mobility Bed Mobility Overal bed mobility: Independent                Transfers Overall transfer level: Independent Equipment used: None Transfers: Sit to/from Stand Sit to Stand: Independent              Balance Overall balance assessment: No apparent balance deficits (not formally assessed) Sitting-balance support: Feet supported Sitting balance-Leahy Scale: Good     Standing balance support: No upper extremity supported Standing balance-Leahy Scale: Good                             ADL either performed or assessed with clinical judgement        Vision Baseline Vision/History: Wears glasses Patient Visual Report: No change  from baseline            Cognition Arousal/Alertness: Awake/alert Behavior During Therapy: WFL for tasks assessed/performed Overall Cognitive Status: Within Functional Limits for tasks assessed                                                     Pertinent Vitals/ Pain       Pain Assessment: No/denies pain         Frequency  Min 2X/week        Progress Toward Goals  OT Goals(current goals can now be found in the care plan section)  Progress towards OT goals: Progressing toward goals  Acute Rehab OT Goals Patient Stated Goal: "I want to not be gasping for air" OT Goal Formulation: With patient Time For Goal Achievement: 03/22/18 Potential to Achieve Goals: [place] Discharge plan remains appropriate       AM-PAC OT "6 Clicks" Daily Activity     Outcome Measure   Help from another person eating meals?: None Help from another person taking care of personal grooming?: None Help from another person toileting, which includes using toliet, bedpan, or urinal?: None Help from another person bathing (including washing, rinsing, drying)?: A Little Help from another person to put on and taking off regular upper body clothing?: None Help from another person to put on and taking off regular lower body  clothing?: A Little 6 Click Score: 22    End of Session    OT Visit Diagnosis: Unsteadiness on feet (R26.81);Muscle weakness (generalized) (M62.81)   Activity Tolerance Patient tolerated treatment well;Patient limited by fatigue   Patient Left in chair;with call bell/phone within reach   Nurse Communication Mobility status        Time: 4883-0141 OT Time Calculation (min): 14 min  Charges: OT General Charges $OT Visit: 1 Visit OT Treatments $Self Care/Home Management : 8-22 mins   Simonne Come, [phone] 03/09/2018, 3:57 PM

## 2018-03-09 NOTE — Progress Notes (Signed)
Nutrition Consult/Brief Note  RD consulted via Inpatient COPD Exacerbation Protocol.  Wt Readings from Last 15 Encounters:  03/08/18 84.9 kg  02/24/18 82.7 kg  02/19/18 82.7 kg  02/12/18 84 kg  02/10/18 84 kg  02/02/18 81.6 kg  01/18/18 79.4 kg  01/02/18 77.3 kg  12/31/17 77.4 kg  12/29/17 78 kg  12/21/17 77.1 kg  11/28/17 76 kg  11/18/17 76.9 kg  11/13/17 77 kg  10/27/17 77.5 kg   Body mass index is 26.11 kg/m. Patient meets criteria for Overweightbased on current BMI.   Current diet order is HH/Carbohydrate Modified, patient is consuming approximately 100% of meals at this time. Labs and medications reviewed.   No nutrition interventions warranted at this time. If nutrition issues arise, please consult RD.   Arthur Holms, RD, LDN Pager #: [phone] After-Hours Pager #: [phone]

## 2018-03-10 LAB — GLUCOSE, CAPILLARY
Glucose-Capillary: 240 mg/dL — ABNORMAL HIGH (ref 70–99)
Glucose-Capillary: 114 mg/dL — ABNORMAL HIGH (ref 70–99)
Glucose-Capillary: 135 mg/dL — ABNORMAL HIGH (ref 70–99)
Glucose-Capillary: 104 mg/dL — ABNORMAL HIGH (ref 70–99)
Glucose-Capillary: 171 mg/dL — ABNORMAL HIGH (ref 70–99)

## 2018-03-10 MED ORDER — ALPRAZOLAM 0.25 MG PO TABS
0.2500 mg | ORAL_TABLET | Freq: Three times a day (TID) | ORAL | Status: DC | PRN
  Administered 2018-03-10 – 2018-03-12 (×5): 0.25 mg via ORAL
  Filled 2018-03-10 (×5): qty 1

## 2018-03-10 MED ORDER — AZITHROMYCIN 250 MG PO TABS
500.0000 mg | ORAL_TABLET | Freq: Every day | ORAL | Status: AC
  Administered 2018-03-10 – 2018-03-12 (×3): 500 mg via ORAL
  Filled 2018-03-10 (×3): qty 2
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
## 2018-03-11 LAB — BASIC METABOLIC PANEL
Anion gap: 10 (ref 5–15)
BUN: 17 mg/dL (ref 8–23)
CO2: 33 mmol/L — ABNORMAL HIGH (ref 22–32)
CREATININE: 1.14 mg/dL (ref 0.61–1.24)
Calcium: 8.9 mg/dL (ref 8.9–10.3)
Chloride: 94 mmol/L — ABNORMAL LOW (ref 98–111)
GFR calc Af Amer: >60 mL/min (ref >60)
GFR calc non Af Amer: >60 mL/min (ref >60)
Glucose, Bld: 279 mg/dL — ABNORMAL HIGH (ref 70–99)
Potassium: 4.5 mmol/L (ref 3.5–5.1)
Sodium: 137 mmol/L (ref 135–145)

## 2018-03-11 LAB — GLUCOSE, CAPILLARY
GLUCOSE-CAPILLARY: 182 mg/dL — AB (ref 70–99)
Glucose-Capillary: 161 mg/dL — ABNORMAL HIGH (ref 70–99)
Glucose-Capillary: 110 mg/dL — ABNORMAL HIGH (ref 70–99)
Glucose-Capillary: 139 mg/dL — ABNORMAL HIGH (ref 70–99)
Glucose-Capillary: 64 mg/dL — ABNORMAL LOW (ref 70–99)

## 2018-03-11 LAB — MAGNESIUM: Magnesium: 2.2 mg/dL (ref 1.7–2.4)

## 2018-03-11 MED ORDER — TAMSULOSIN HCL 0.4 MG PO CAPS
0.4000 mg | ORAL_CAPSULE | Freq: Every day | ORAL | Status: DC
  Administered 2018-03-11 – 2018-03-12 (×2): 0.4 mg via ORAL
  Filled 2018-03-11 (×2): qty 1
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
## 2018-03-11 NOTE — Clinical Social Work Note (Addendum)
CSW spoke with the admission director at [facility] in [facility] and they will not take pt because of the 02. Director knew someone who owened a group home in [place] and he would put in a call to check and see if they would take pt. CSW received a call back and the group home is unable to take pt because their location is too close to a school. Pt is not allowed at [facility] as they are located too close to a nursery.  CSW will continue to search for a place for pt to d/c to.  [place], [place]

## 2018-03-11 NOTE — Progress Notes (Signed)
Physical Therapy Treatment Patient Details Name: Taylor [name] MRN: [id number] DOB: Apr 26, 1955 Today's Date: 03/11/2018    History of Present Illness Ptis a 63 yo male s/p COPD exacerbation with complaints of SOB. PMHx: COPD, emphysema on 3L continuous,  bipolar d/o, depression, prostate CA, colon sx.    PT Comments    Patient with decreased activity tolerance, ambulating 50-60' before requiring standing rest break. satting 88-94% during visit on 3L. Pt expressing his tolerance inhibiting his ability to perform ADLs like showering and getting dressed. May require more assistance after d/c.    Follow Up Recommendations  Home health PT     Equipment Recommendations  None recommended by PT    Recommendations for Other Services       Precautions / Restrictions Precautions Precautions: Fall Precaution Comments: 3L O2 continuous Restrictions Weight Bearing Restrictions: No    Mobility  Bed Mobility Overal bed mobility: Independent                Transfers Overall transfer level: Independent Equipment used: None Transfers: Sit to/from Stand Sit to Stand: Independent         General transfer comment: min-guard for safety  Ambulation/Gait Ambulation/Gait assistance: Supervision;Min guard Gait Distance (Feet): 200 Feet Assistive device: None Gait Pattern/deviations: WFL(Within Functional Limits) Gait velocity: decreased   General Gait Details: pt ambulating withotu LOB  or DOE, 3L remains 89-93% during session with standing rest break x2   Stairs             Wheelchair Mobility    Modified Rankin (Stroke Patients Only)       Balance Overall balance assessment: No apparent balance deficits (not formally assessed) Sitting-balance support: Feet supported Sitting balance-Leahy Scale: Good     Standing balance support: No upper extremity supported Standing balance-Leahy Scale: Good Standing balance comment: limitations due to fatigue                             Cognition Arousal/Alertness: Awake/alert Behavior During Therapy: WFL for tasks assessed/performed Overall Cognitive Status: Within Functional Limits for tasks assessed                                        Exercises      General Comments        Pertinent Vitals/Pain Pain Assessment: No/denies pain    Home Living                      Prior Function            PT Goals (current goals can now be found in the care plan section) Acute Rehab PT Goals PT Goal Formulation: With patient Time For Goal Achievement: 03/22/18 Potential to Achieve Goals: Fair Progress towards PT goals: Progressing toward goals    Frequency    Min 3X/week      PT Plan Current plan remains appropriate    Co-evaluation              AM-PAC PT "6 Clicks" Mobility   Outcome Measure  Help needed turning from your back to your side while in a flat bed without using bedrails?: None Help needed moving from lying on your back to sitting on the side of a flat bed without using bedrails?: None Help needed moving to and from a bed to a chair (  including a wheelchair)?: None Help needed standing up from a chair using your arms (e.g., wheelchair or bedside chair)?: A Little Help needed to walk in hospital room?: A Little Help needed climbing 3-5 steps with a railing? : A Little 6 Click Score: 21    End of Session Equipment Utilized During Treatment: Gait belt;Oxygen Activity Tolerance: Patient limited by fatigue Patient left: in bed;with call bell/phone within reach Nurse Communication: Mobility status PT Visit Diagnosis: Muscle weakness (generalized) (M62.81);Difficulty in walking, not elsewhere classified (R26.2)     Time: 1240-1300 PT Time Calculation (min) (ACUTE ONLY): 20 min  Charges:  $Gait Training: 8-22 mins                     Reinaldo Berber, PT, DPT Acute Rehabilitation Services Pager: [phone] Office:  [phone]     Reinaldo Berber 03/11/2018, 2:41 PM

## 2018-03-11 NOTE — Progress Notes (Signed)
Notified Dr Tawanna Solo of patient having episodes of ST with rate up to 140, unsustained.
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
## 2018-03-12 ENCOUNTER — Ambulatory Visit: Payer: Medicaid Other

## 2018-03-12 LAB — GLUCOSE, CAPILLARY
GLUCOSE-CAPILLARY: 130 mg/dL — AB (ref 70–99)
Glucose-Capillary: 102 mg/dL — ABNORMAL HIGH (ref 70–99)
Glucose-Capillary: 155 mg/dL — ABNORMAL HIGH (ref 70–99)

## 2018-03-12 MED ORDER — PREDNISONE 20 MG PO TABS: 40.0000 mg | ORAL_TABLET | Freq: Every day | ORAL | 0 refills | Status: DC

## 2018-03-12 MED ORDER — TAMSULOSIN HCL 0.4 MG PO CAPS: 0.4000 mg | ORAL_CAPSULE | Freq: Every day | ORAL | 0 refills | Status: DC

## 2018-03-12 MED ORDER — IPRATROPIUM-ALBUTEROL 0.5-2.5 (3) MG/3ML IN SOLN
3.0000 mL | Freq: Three times a day (TID) | RESPIRATORY_TRACT | Status: DC
  Administered 2018-03-12: 3 mL via RESPIRATORY_TRACT
  Filled 2018-03-12 (×2): qty 3

## 2018-03-12 NOTE — Progress Notes (Signed)
Occupational Therapy Treatment Patient Details Name: Taylor [name] MRN: [id number] DOB: 1955-06-25 Today's Date: 03/12/2018    History of present illness Ptis a 63 yo male s/p COPD exacerbation with complaints of SOB. PMHx: COPD, emphysema on 3L continuous,  bipolar d/o, depression, prostate CA, colon sx.   OT comments  Pt reports he is ready to d/c.  Pt completed mobility in room without AD at overall Independent level.  Pt completed toilet transfers without assistance.  Discussed modifications to routines as pt reports continued difficulty with ADLs, however pt able to complete at PLOF.  Reiterated energy conservation strategies and recommendation to bathe sit > stand level at sink for energy conservation.  Pt reports working on arranging alternative d/c plans.  Pt declined any DME, stating that he can manage toilet without DME and will continue to bathe at sink.    Follow Up Recommendations  No OT follow up;Supervision - Intermittent    Equipment Recommendations  None recommended by OT       Precautions / Restrictions Precautions Precautions: Fall Precaution Comments: 3L O2 continuous Restrictions Weight Bearing Restrictions: No       Mobility Bed Mobility Overal bed mobility: Independent                Transfers Overall transfer level: Independent Equipment used: None Transfers: Sit to/from Stand Sit to Stand: Independent                  ADL either performed or assessed with clinical judgement   ADL Overall ADL's : At baseline                                     Functional mobility during ADLs: Modified independent General ADL Comments: Pt requires increased time with self-care tasks, demonstrating standing tolerance <2 mins at a time. Pt tolerating a few mins with rest breaks throughout. Pt reports he is back to baseline     Vision Baseline Vision/History: Wears glasses Patient Visual Report: No change from baseline Vision  Assessment?: No apparent visual deficits          Cognition Arousal/Alertness: Awake/alert Behavior During Therapy: WFL for tasks assessed/performed Overall Cognitive Status: Within Functional Limits for tasks assessed                                                     Pertinent Vitals/ Pain       Pain Assessment: No/denies pain         Frequency  Min 2X/week        Progress Toward Goals  OT Goals(current goals can now be found in the care plan section)  Progress towards OT goals: Progressing toward goals  Acute Rehab OT Goals Patient Stated Goal: I'm ready to go  OT Goal Formulation: With patient Time For Goal Achievement: 03/22/18 Potential to Achieve Goals: Good  Plan Discharge plan remains appropriate       AM-PAC OT "6 Clicks" Daily Activity     Outcome Measure   Help from another person eating meals?: None Help from another person taking care of personal grooming?: None Help from another person toileting, which includes using toliet, bedpan, or urinal?: None Help from another person bathing (including washing, rinsing, drying)?: A Little Help from  another person to put on and taking off regular upper body clothing?: None Help from another person to put on and taking off regular lower body clothing?: A Little 6 Click Score: 22    End of Session    OT Visit Diagnosis: Unsteadiness on feet (R26.81);Muscle weakness (generalized) (M62.81)   Activity Tolerance Patient tolerated treatment well;Patient limited by fatigue   Patient Left in chair;with call bell/phone within reach   Nurse Communication Mobility status        Time: 4695-0722 OT Time Calculation (min): 13 min  Charges: OT General Charges $OT Visit: 1 Visit OT Treatments $Self Care/Home Management : 8-22 mins   Simonne Come, [place] 03/12/2018, 12:07 PM
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
## 2018-03-12 NOTE — Discharge Summary (Signed)
Physician Discharge Summary  Taylor [name] [id number]:[id number] DOB: 1955-11-23 DOA: 03/07/2018  PCP: Clent Demark, PA-C  Admit date: 03/07/2018 Discharge date: 03/12/2018  Admitted From: Home Disposition:  Home  Discharge Condition:Stable CODE STATUS:FULL Diet recommendation: Heart Healthy   Brief/Interim Summary: Patient is a 63 year old male with past medical history of COPD on 3 L oxygen per minute, bipolar disorder, depression, prostate cancer, continued tobacco abuse who presents to the emergency department complaints of shortness of breath.  He was admitted for the management of COPD exacerbation.  Currently his respiratory status has stabilized.    Plan for discharge today.  Following problems were addressed during his hospitalization:  Acute on chronic hypoxic respiratory failure: Presented with shortness of breath.  On baseline he is on 3 L oxygen per minute through nasal cannula at home.  Presented with wheezing.  CXR did not  pneumonia.  Flu panel was negative.  He was started on IV steroids.  Currently on 3 L oxygen which is baseline.  His respiratory status is stable.  COPD exacerbation: Has known history of COPD.  Continues to smoke 1 pack a day.  Follows with pulmonology, Dr. Melvyn Novas.  He has an appointment on March 9. Steroids have been changed to oral.  Continue bronchodilators on discharge.  Completed course of azithromycin.  Tobacco abuse: Has been smoking a pack a day since last several years.  Counseled for cessation.  Near syncope: Suspected likely related to orthostatic episode with reports of diarrhea.  Patient was started on IV fluids with improvement of the symptoms.  Echocardiogram showed ejection fraction of 65%.  No arrhythmias in the EKG.  PT evaluated him and recommended home health on discharge.  Diarrhea: Resolved  Diabetes type 2: Last hemoglobin A1c of 7.7 on 02/19/2018.  Continue sliding scale insulin for now.  Hyponatremia:  Resolved  History of prostate cancer: Follows with Dr. Tammi Klippel.  He has been planned to be started on radiation therapy for his prostate cancer as an outpatient.  He says he has an appointment on coming Thursday but due to his disposition plan, his appointment might need to be rescheduled.    Discharge Diagnoses:  Principal Problem:   Acute on chronic respiratory failure with hypoxia (HCC) Active Problems:   COPD exacerbation (HCC)   Tobacco abuse   Prostate cancer (HCC)   Chronic respiratory failure with hypoxia and hypercapnia (HCC)   Type 2 diabetes mellitus ([place])   Essential hypertension   Near syncope   Diarrhea   Hyponatremia    Discharge Instructions  Discharge Instructions    Diet - low sodium heart healthy   Complete by:  As directed    Discharge instructions   Complete by:  As directed    1)Take prescribed medication as instructed. 2)Follow up with your PCP in a week. 3)Stop smoking.   Increase activity slowly   Complete by:  As directed      Allergies as of 03/12/2018      Reactions   Codeine Nausea And Vomiting      Medication List    TAKE these medications   ACCU-CHEK AVIVA device Use as instructed   accu-chek soft touch lancets 1 each by Other route 2 (two) times daily. Use as instructed   albuterol (2.5 MG/3ML) 0.083% nebulizer solution Commonly known as:  PROVENTIL Take 3 mLs (2.5 mg total) by nebulization every 4 (four) hours as needed for wheezing or shortness of breath (if you can't catch your breath).   ARIPiprazole 5 MG  tablet Commonly known as:  ABILIFY Take 5 mg by mouth daily.   benzonatate 100 MG capsule Commonly known as:  TESSALON Take 1 capsule (100 mg total) by mouth every 8 (eight) hours. What changed:    when to take this  reasons to take this   escitalopram 20 MG tablet Commonly known as:  LEXAPRO Take 20 mg by mouth daily.   Fluticasone-Umeclidin-Vilant 100-62.5-25 MCG/INH Aepb Commonly known as:  TRELEGY  ELLIPTA Inhale 1 puff into the lungs daily.   folic acid 1 MG tablet Commonly known as:  FOLVITE Take 1 tablet (1 mg total) by mouth 3 (three) times a week.   glucose blood test strip Commonly known as:  ACCU-CHEK AVIVA 1 each by Other route as needed for other. Use as instructed   lovastatin 20 MG tablet Commonly known as:  MEVACOR Take 1 tablet (20 mg total) by mouth at bedtime.   metFORMIN 1000 MG tablet Commonly known as:  GLUCOPHAGE Take 1 tablet (1,000 mg total) by mouth 2 (two) times daily with a meal.   mometasone-formoterol 100-5 MCG/ACT Aero Commonly known as:  DULERA Inhale 2 puffs into the lungs 2 (two) times daily.   nicotine 21 mg/24hr patch Commonly known as:  NICODERM CQ - dosed in mg/24 hours Place 21 mg onto the skin daily.   OXYGEN Inhale 3 L into the lungs continuous.   pantoprazole 40 MG tablet Commonly known as:  PROTONIX Take 1 tablet (40 mg total) by mouth daily.   polyethylene glycol packet Commonly known as:  MIRALAX / GLYCOLAX Take 17 g by mouth daily.   predniSONE 10 MG tablet Commonly known as:  DELTASONE Take 5 mg by mouth daily with breakfast. What changed:  Another medication with the same name was added. Make sure you understand how and when to take each.   predniSONE 20 MG tablet Commonly known as:  DELTASONE Take 2 tablets (40 mg total) by mouth daily with breakfast. Start taking on:  March 13, 2018 What changed:  You were already taking a medication with the same name, and this prescription was added. Make sure you understand how and when to take each.   senna-docusate 8.6-50 MG tablet Commonly known as:  Senokot-S Take 1 tablet by mouth 2 (two) times daily.   tamsulosin 0.4 MG Caps capsule Commonly known as:  FLOMAX Take 1 capsule (0.4 mg total) by mouth daily. Start taking on:  March 13, 2018   tiotropium 18 MCG inhalation capsule Commonly known as:  SPIRIVA Place 18 mcg into inhaler and inhale daily.       Follow-up Information    [facility] Follow [facility].   Specialty:  Home Health Services Why:  HHPT Contact information: [place] [address] [phone]        Clent Demark, PA-C. Schedule an appointment as soon as possible for a visit in 1 week(s).   Specialty:  Physician Assistant Contact information: [place] [address] [phone]          Allergies  Allergen Reactions  . Codeine Nausea And Vomiting    Consultations: None  Procedures/Studies: Dg Chest 2 View  Result Date: 02/12/2018 CLINICAL DATA:  Shortness of breath, chest pain EXAM: CHEST - 2 VIEW COMPARISON:  01/17/2018 FINDINGS: Hyperinflation with emphysematous disease. No acute airspace disease or effusion. Normal heart size. No pneumothorax. Multiple old right rib fractures. IMPRESSION: No active cardiopulmonary disease. Hyperinflation with emphysematous disease. Electronically Signed  By: Donavan Foil M.D.   On: 02/12/2018 18:49   Dg Chest Portable 1 View  Result Date: 03/07/2018 CLINICAL DATA:  Shortness of breath 2 days worse today. Abdominal distension with pain and diarrhea. EXAM: PORTABLE CHEST 1 VIEW COMPARISON:  02/12/2018 FINDINGS: Lungs are somewhat hyperexpanded with increased lucency over the mid to upper lungs compatible with emphysematous disease. No focal airspace consolidation or effusion. Cardiomediastinal silhouette and remainder of the exam is unchanged. IMPRESSION: No acute findings. Emphysematous disease. Electronically Signed   By: Marin Olp M.D.   On: 03/07/2018 23:00   Dg Abd 2 Views  Result Date: 03/08/2018 CLINICAL DATA:  Abdominal distension and diarrhea. EXAM: ABDOMEN - 2 VIEW COMPARISON:  None. FINDINGS: The bowel gas pattern is normal. There is no evidence of free air. No radio-opaque calculi or other significant radiographic abnormality is seen. IMPRESSION: No bowel obstruction. Electronically Signed   By:  Abelardo Diesel M.D.   On: 03/08/2018 03:42       Subjective: Patient seen and examined the bedside this morning.  Remains comfortable.  Hemodynamically stable for discharge today.  Discharge Exam: Vitals:   03/12/18 0732 03/12/18 1332  BP: 123/85   Pulse: 83   Resp: 19   Temp: 98.6 F (37 C)   SpO2: 98% 96%   Vitals:   03/11/18 2244 03/12/18 0722 03/12/18 0732 03/12/18 1332  BP: 128/83  123/85   Pulse: 85  83   Resp: 18  19   Temp: 98 F (36.7 C)  98.6 F (37 C)   TempSrc: Oral     SpO2: 97% 98% 98% 96%  Weight:      Height:        General: Pt is alert, awake, not in acute distress Cardiovascular: RRR, S1/S2 +, no rubs, no gallops Respiratory: CTA bilaterally, no wheezing, no rhonchi Abdominal: Soft, NT, ND, bowel sounds + Extremities: no edema, no cyanosis    The results of significant diagnostics from this hospitalization (including imaging, microbiology, ancillary and laboratory) are listed below for reference.     Microbiology: No results found for this or any previous visit (from the past 240 hour(s)).   Labs: BNP (last 3 results) Recent Labs    09/10/17 2215 12/21/17 1551 01/17/18 1454  BNP 94.6 27.5 32.2   Basic Metabolic Panel: Recent Labs  Lab 03/07/18 2225 03/08/18 0302 03/09/18 0336 03/11/18 1431  NA 130* 135 141 137  K 4.0 4.1 4.6 4.5  CL 92* 92* 99 94*  CO2 26 30 33* 33*  GLUCOSE 139* 227* 191* 279*  BUN 14 13 17 17   CREATININE 0.92 0.98 0.91 1.14  CALCIUM 8.7* 9.0 8.9 8.9  MG  --   --   --  2.2   Liver Function Tests: Recent Labs  Lab 03/07/18 2225  AST 25  ALT 20  ALKPHOS 48  BILITOT 0.5  PROT 7.1  ALBUMIN 4.5   No results for input(s): LIPASE, AMYLASE in the last 168 hours. No results for input(s): AMMONIA in the last 168 hours. CBC: Recent Labs  Lab 03/07/18 2225 03/09/18 0336  WBC 9.1 10.1  NEUTROABS 6.8  --   HGB 13.1 12.3*  HCT 40.9 37.5*  MCV 90.5 91.2  PLT 311 265   Cardiac Enzymes: Recent Labs   Lab 03/07/18 2225  TROPONINI <0.03   BNP: Invalid input(s): POCBNP CBG: Recent Labs  Lab 03/11/18 1705 03/11/18 1955 03/11/18 2147 03/12/18 0728 03/12/18 1219  GLUCAP 182* 139* 64* 102* 130*  D-Dimer No results for input(s): DDIMER in the last 72 hours. Hgb A1c No results for input(s): HGBA1C in the last 72 hours. Lipid Profile No results for input(s): CHOL, HDL, LDLCALC, TRIG, CHOLHDL, LDLDIRECT in the last 72 hours. Thyroid function studies No results for input(s): TSH, T4TOTAL, T3FREE, THYROIDAB in the last 72 hours.  Invalid input(s): FREET3 Anemia work up No results for input(s): VITAMINB12, FOLATE, FERRITIN, TIBC, IRON, RETICCTPCT in the last 72 hours. Urinalysis    Component Value Date/Time   COLORURINE YELLOW 01/17/2018 [place] 01/17/2018 1731   LABSPEC 1.036 (H) 01/17/2018 1731   PHURINE 5.0 01/17/2018 1731   GLUCOSEU >=500 (A) 01/17/2018 1731   HGBUR NEGATIVE 01/17/2018 1731   BILIRUBINUR NEGATIVE 01/17/2018 1731   KETONESUR 5 (A) 01/17/2018 1731   PROTEINUR NEGATIVE 01/17/2018 1731   UROBILINOGEN 1.0 01/09/2013 1843   NITRITE NEGATIVE 01/17/2018 1731   LEUKOCYTESUR NEGATIVE 01/17/2018 1731   Sepsis Labs Invalid input(s): PROCALCITONIN,  WBC,  LACTICIDVEN Microbiology No results found for this or any previous visit (from the past 240 hour(s)).  Please note: You were cared for by a hospitalist during your hospital stay. Once you are discharged, your primary care physician will handle any further medical issues. Please note that NO REFILLS for any discharge medications will be authorized once you are discharged, as it is imperative that you return to your primary care physician (or establish a relationship with a primary care physician if you do not have one) for your post hospital discharge needs so that they can reassess your need for medications and monitor your lab values.    Time coordinating discharge: 40  minutes  SIGNED:   Shelly Coss, MD  [facility] 03/12/2018, 2:19 PM Pager [phone]  If 7PM-7AM, please contact night-coverage [email or web address] Password [email or web address]
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
## 2018-03-12 NOTE — Progress Notes (Signed)
Physical Therapy Treatment Patient Details Name: LAITH ANTONELLI MRN: [id number] DOB: 1955/06/28 Today's Date: 03/12/2018    History of Present Illness Ptis a 63 yo male s/p COPD exacerbation with complaints of SOB. PMHx: COPD, emphysema on 3L continuous,  bipolar d/o, depression, prostate CA, colon sx.    PT Comments    Patient progressing now tolerating activity back on baseline 3L without desatting. Pt plans to go to friends and has all DME needs met there. Cont to rec HHPT to safely progress.     Follow Up Recommendations  Home health PT     Equipment Recommendations  None recommended by PT    Recommendations for Other Services       Precautions / Restrictions Precautions Precautions: Fall Precaution Comments: 3L O2 continuous Restrictions Weight Bearing Restrictions: No    Mobility  Bed Mobility Overal bed mobility: Independent                Transfers Overall transfer level: Independent Equipment used: None Transfers: Sit to/from Stand Sit to Stand: Independent            Ambulation/Gait Ambulation/Gait assistance: Supervision;Min guard Gait Distance (Feet): 200 Feet Assistive device: None Gait Pattern/deviations: WFL(Within Functional Limits) Gait velocity: decreased   General Gait Details: pt satting better on 3L this visit above 90% all times. standing rest break.  no LOB    Stairs             Wheelchair Mobility    Modified Rankin (Stroke Patients Only)       Balance Overall balance assessment: No apparent balance deficits (not formally assessed) Sitting-balance support: Feet supported Sitting balance-Leahy Scale: Good     Standing balance support: No upper extremity supported Standing balance-Leahy Scale: Good                              Cognition Arousal/Alertness: Awake/alert Behavior During Therapy: WFL for tasks assessed/performed Overall Cognitive Status: Within Functional Limits for tasks  assessed                                        Exercises      General Comments        Pertinent Vitals/Pain Pain Assessment: No/denies pain    Home Living                      Prior Function            PT Goals (current goals can now be found in the care plan section) Acute Rehab PT Goals Patient Stated Goal: I'm ready to go  PT Goal Formulation: With patient Time For Goal Achievement: 03/22/18 Potential to Achieve Goals: Good Progress towards PT goals: Progressing toward goals    Frequency    Min 3X/week      PT Plan Current plan remains appropriate    Co-evaluation              AM-PAC PT "6 Clicks" Mobility   Outcome Measure  Help needed turning from your back to your side while in a flat bed without using bedrails?: None Help needed moving from lying on your back to sitting on the side of a flat bed without using bedrails?: None Help needed moving to and from a bed to a chair (including a wheelchair)?: None Help needed standing  up from a chair using your arms (e.g., wheelchair or bedside chair)?: A Little Help needed to walk in hospital room?: A Little Help needed climbing 3-5 steps with a railing? : A Little 6 Click Score: 21    End of Session Equipment Utilized During Treatment: Gait belt;Oxygen Activity Tolerance: Patient limited by fatigue Patient left: in bed;with call bell/phone within reach Nurse Communication: Mobility status PT Visit Diagnosis: Muscle weakness (generalized) (M62.81);Difficulty in walking, not elsewhere classified (R26.2)     Time: 4383-7793 PT Time Calculation (min) (ACUTE ONLY): 20 min  Charges:  $Gait Training: 8-22 mins                     Reinaldo Berber, PT, DPT Acute Rehabilitation Services Pager: [phone] Office: [place] 03/12/2018, 3:01 PM
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
## 2018-03-12 NOTE — Progress Notes (Signed)
Patient discharged via PTAR at 19:29, all belongings with patient, oxygen on at 3L nasal canula, peripheral IV site removed, all discharge paperwork with patient upon discharge.

## 2018-03-13 ENCOUNTER — Ambulatory Visit: Payer: Medicaid Other
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
## 2018-03-16 ENCOUNTER — Ambulatory Visit: Payer: Medicaid Other

## 2018-03-17 ENCOUNTER — Encounter: Payer: Self-pay | Admitting: Medical Oncology

## 2018-03-17 ENCOUNTER — Ambulatory Visit
Admission: RE | Admit: 2018-03-17 | Discharge: 2018-03-17 | Disposition: A | Discharge status: Home / Self Care | Payer: Medicaid Other | Source: Ambulatory Visit | Attending: Radiation Oncology | Admitting: Radiation Oncology

## 2018-03-17 DIAGNOSIS — C61 Malignant neoplasm of prostate: Secondary | ICD-10-CM | POA: Diagnosis not present

## 2018-03-17 DIAGNOSIS — Z510 Encounter for antineoplastic radiation therapy: Secondary | ICD-10-CM | POA: Diagnosis present

## 2018-03-18 ENCOUNTER — Ambulatory Visit
Admission: RE | Admit: 2018-03-18 | Discharge: 2018-03-18 | Disposition: A | Discharge status: Home / Self Care | Payer: Medicaid Other | Source: Ambulatory Visit | Attending: Radiation Oncology | Admitting: Radiation Oncology

## 2018-03-18 DIAGNOSIS — Z510 Encounter for antineoplastic radiation therapy: Secondary | ICD-10-CM | POA: Diagnosis not present

## 2018-03-19 ENCOUNTER — Telehealth: Payer: Self-pay | Admitting: Radiation Oncology

## 2018-03-19 ENCOUNTER — Ambulatory Visit: Payer: Medicaid Other

## 2018-03-19 NOTE — Telephone Encounter (Signed)
Received voicemail message from patient wishing to cancel radiation treatment for today and tomorrow. States, "I am going out of town.Marland Kitcheni will be back Monday for treatment." Informed Micheline Chapman, RT on L1 of this finding.  Patient requires continuous oxygen therapy and transfers using a wheelchair. This P[place] Curia is uncertain how patient could manage a trip alone. Heather, RT on L1 reports the patient is unable to provide a contact number stating he is trying to work out something with [employer].   Phoned [employer] patient's last known "residence." Staff report he no longer stays there.   Phoned patient's sister, Hilda Blades. She reports she hasn't heard from her brother in two weeks. She explains when they last spoke he told her he was moving in with a guy in [place]. Hilda Blades is uncertain who he is moving in with and does not have a contact number for her brother. Provided her with my contact number in the event he reaches out to her.   Phoned patient's best friend, Carrolyn Meiers. No answer. Left message requesting return call.  Phoned [phone] the number the patient called from. There was no answer and the name on the voicemail was Quita Skye thus I did not leave a message  Phoned temporary number for patient [phone]. No answer. Left message requesting return call.

## 2018-03-20 ENCOUNTER — Ambulatory Visit: Payer: Medicaid Other

## 2018-03-23 ENCOUNTER — Ambulatory Visit
Admission: RE | Admit: 2018-03-23 | Discharge: 2018-03-23 | Disposition: A | Discharge status: Home / Self Care | Payer: Medicaid Other | Source: Ambulatory Visit | Attending: Radiation Oncology | Admitting: Radiation Oncology

## 2018-03-23 ENCOUNTER — Ambulatory Visit (INDEPENDENT_AMBULATORY_CARE_PROVIDER_SITE_OTHER): Payer: Self-pay | Admitting: Primary Care

## 2018-03-23 DIAGNOSIS — Z510 Encounter for antineoplastic radiation therapy: Secondary | ICD-10-CM | POA: Insufficient documentation

## 2018-03-23 DIAGNOSIS — C61 Malignant neoplasm of prostate: Secondary | ICD-10-CM | POA: Insufficient documentation

## 2018-03-24 ENCOUNTER — Ambulatory Visit: Payer: Medicaid Other

## 2018-03-25 ENCOUNTER — Ambulatory Visit
Admission: RE | Admit: 2018-03-25 | Discharge: 2018-03-25 | Disposition: A | Discharge status: Home / Self Care | Payer: Medicaid Other | Source: Ambulatory Visit | Attending: Radiation Oncology | Admitting: Radiation Oncology

## 2018-03-25 DIAGNOSIS — Z510 Encounter for antineoplastic radiation therapy: Secondary | ICD-10-CM | POA: Diagnosis not present

## 2018-03-26 ENCOUNTER — Encounter (HOSPITAL_COMMUNITY): Payer: Self-pay

## 2018-03-26 ENCOUNTER — Emergency Department (HOSPITAL_COMMUNITY): Payer: Medicaid Other

## 2018-03-26 ENCOUNTER — Ambulatory Visit: Payer: Medicaid Other

## 2018-03-26 ENCOUNTER — Other Ambulatory Visit: Payer: Self-pay

## 2018-03-26 ENCOUNTER — Observation Stay (HOSPITAL_COMMUNITY)
Admission: EM | Admit: 2018-03-26 | Discharge: 2018-03-27 | Disposition: A | Discharge status: Home / Self Care | Payer: Medicaid Other | Attending: Internal Medicine | Admitting: Internal Medicine

## 2018-03-26 ENCOUNTER — Emergency Department (HOSPITAL_BASED_OUTPATIENT_CLINIC_OR_DEPARTMENT_OTHER): Payer: Medicaid Other

## 2018-03-26 DIAGNOSIS — R059 Cough, unspecified: Secondary | ICD-10-CM

## 2018-03-26 DIAGNOSIS — Z8546 Personal history of malignant neoplasm of prostate: Secondary | ICD-10-CM | POA: Insufficient documentation

## 2018-03-26 DIAGNOSIS — R05 Cough: Secondary | ICD-10-CM | POA: Diagnosis present

## 2018-03-26 DIAGNOSIS — R0902 Hypoxemia: Secondary | ICD-10-CM | POA: Diagnosis present

## 2018-03-26 DIAGNOSIS — M79606 Pain in leg, unspecified: Secondary | ICD-10-CM | POA: Insufficient documentation

## 2018-03-26 DIAGNOSIS — E119 Type 2 diabetes mellitus without complications: Secondary | ICD-10-CM

## 2018-03-26 DIAGNOSIS — R609 Edema, unspecified: Secondary | ICD-10-CM

## 2018-03-26 DIAGNOSIS — Z794 Long term (current) use of insulin: Secondary | ICD-10-CM | POA: Insufficient documentation

## 2018-03-26 DIAGNOSIS — Z85038 Personal history of other malignant neoplasm of large intestine: Secondary | ICD-10-CM | POA: Insufficient documentation

## 2018-03-26 DIAGNOSIS — J441 Chronic obstructive pulmonary disease with (acute) exacerbation: Principal | ICD-10-CM | POA: Diagnosis present

## 2018-03-26 DIAGNOSIS — J9611 Chronic respiratory failure with hypoxia: Secondary | ICD-10-CM | POA: Diagnosis not present

## 2018-03-26 DIAGNOSIS — Z885 Allergy status to narcotic agent status: Secondary | ICD-10-CM | POA: Insufficient documentation

## 2018-03-26 DIAGNOSIS — F319 Bipolar disorder, unspecified: Secondary | ICD-10-CM | POA: Diagnosis not present

## 2018-03-26 DIAGNOSIS — E1169 Type 2 diabetes mellitus with other specified complication: Secondary | ICD-10-CM | POA: Diagnosis not present

## 2018-03-26 DIAGNOSIS — J9612 Chronic respiratory failure with hypercapnia: Secondary | ICD-10-CM

## 2018-03-26 DIAGNOSIS — F17210 Nicotine dependence, cigarettes, uncomplicated: Secondary | ICD-10-CM | POA: Insufficient documentation

## 2018-03-26 DIAGNOSIS — Z79899 Other long term (current) drug therapy: Secondary | ICD-10-CM | POA: Insufficient documentation

## 2018-03-26 DIAGNOSIS — Z9981 Dependence on supplemental oxygen: Secondary | ICD-10-CM | POA: Insufficient documentation

## 2018-03-26 DIAGNOSIS — R0602 Shortness of breath: Secondary | ICD-10-CM

## 2018-03-26 LAB — COMPREHENSIVE METABOLIC PANEL
ALBUMIN: 3.9 g/dL (ref 3.5–5.0)
ALT: 21 U/L (ref 0–44)
AST: 22 U/L (ref 15–41)
Alkaline Phosphatase: 42 U/L (ref 38–126)
Anion gap: 11 (ref 5–15)
BUN: 8 mg/dL (ref 8–23)
CO2: 32 mmol/L (ref 22–32)
Calcium: 8.9 mg/dL (ref 8.9–10.3)
Chloride: 95 mmol/L — ABNORMAL LOW (ref 98–111)
Creatinine, Ser: 0.76 mg/dL (ref 0.61–1.24)
GFR calc Af Amer: >60 mL/min (ref >60)
GFR calc non Af Amer: >60 mL/min (ref >60)
GLUCOSE: 122 mg/dL — AB (ref 70–99)
Potassium: 4 mmol/L (ref 3.5–5.1)
SODIUM: 138 mmol/L (ref 135–145)
Total Bilirubin: 0.9 mg/dL (ref 0.3–1.2)
Total Protein: 6.1 g/dL — ABNORMAL LOW (ref 6.5–8.1)

## 2018-03-26 LAB — BRAIN NATRIURETIC PEPTIDE: B Natriuretic Peptide: 24.2 pg/mL (ref 0.0–100.0)

## 2018-03-26 LAB — CBC WITH DIFFERENTIAL/PLATELET
Abs Immature Granulocytes: 0.04 10*3/uL (ref 0.00–0.07)
BASOS ABS: 0 10*3/uL (ref 0.0–0.1)
Basophils Relative: 0 %
Eosinophils Absolute: 0.1 10*3/uL (ref 0.0–0.5)
Eosinophils Relative: 2 %
HCT: 36.3 % — ABNORMAL LOW (ref 39.0–52.0)
Hemoglobin: 11.5 g/dL — ABNORMAL LOW (ref 13.0–17.0)
Immature Granulocytes: 1 %
Lymphocytes Relative: 23 %
Lymphs Abs: 1.1 10*3/uL (ref 0.7–4.0)
MCH: 29.5 pg (ref 26.0–34.0)
MCHC: 31.7 g/dL (ref 30.0–36.0)
MCV: 93.1 fL (ref 80.0–100.0)
Monocytes Absolute: 0.5 10*3/uL (ref 0.1–1.0)
Monocytes Relative: 10 %
Neutro Abs: 3.2 10*3/uL (ref 1.7–7.7)
Neutrophils Relative %: 64 %
Platelets: 170 10*3/uL (ref 150–400)
RBC: 3.9 MIL/uL — ABNORMAL LOW (ref 4.22–5.81)
RDW: 13 % (ref 11.5–15.5)
WBC: 5 10*3/uL (ref 4.0–10.5)
nRBC: 0 % (ref 0.0–0.2)

## 2018-03-26 LAB — I-STAT TROPONIN, ED
Troponin i, poc: 0 ng/mL (ref 0.00–0.08)
Troponin i, poc: 0 ng/mL (ref 0.00–0.08)

## 2018-03-26 LAB — MAGNESIUM: Magnesium: 1.7 mg/dL (ref 1.7–2.4)

## 2018-03-26 LAB — D-DIMER, QUANTITATIVE: D-Dimer, Quant: 0.3 ug/mL-FEU (ref 0.00–0.50)

## 2018-03-26 LAB — GLUCOSE, CAPILLARY: Glucose-Capillary: 155 mg/dL — ABNORMAL HIGH (ref 70–99)

## 2018-03-26 MED ORDER — SENNOSIDES-DOCUSATE SODIUM 8.6-50 MG PO TABS: 1.0000 | ORAL_TABLET | Freq: Every day | ORAL | Status: DC | PRN

## 2018-03-26 MED ORDER — AZITHROMYCIN 250 MG PO TABS
500.0000 mg | ORAL_TABLET | Freq: Every day | ORAL | Status: DC
  Administered 2018-03-27: 500 mg via ORAL
  Filled 2018-03-26 (×2): qty 2

## 2018-03-26 MED ORDER — ONDANSETRON HCL 4 MG/2ML IJ SOLN
4.0000 mg | Freq: Once | INTRAMUSCULAR | Status: AC
  Administered 2018-03-26: 4 mg via INTRAVENOUS
  Filled 2018-03-26: qty 2

## 2018-03-26 MED ORDER — SODIUM CHLORIDE 0.9 % IV SOLN
500.0000 mg | INTRAVENOUS | Status: AC
  Administered 2018-03-26: 500 mg via INTRAVENOUS
  Filled 2018-03-26: qty 500

## 2018-03-26 MED ORDER — UMECLIDINIUM BROMIDE 62.5 MCG/INH IN AEPB
1.0000 | INHALATION_SPRAY | Freq: Every day | RESPIRATORY_TRACT | Status: DC
  Administered 2018-03-27: 1 via RESPIRATORY_TRACT
  Filled 2018-03-26: qty 7

## 2018-03-26 MED ORDER — INSULIN ASPART 100 UNIT/ML ~~LOC~~ SOLN: 0.0000 [IU] | Freq: Three times a day (TID) | SUBCUTANEOUS | Status: DC

## 2018-03-26 MED ORDER — MOMETASONE FURO-FORMOTEROL FUM 100-5 MCG/ACT IN AERO
2.0000 | INHALATION_SPRAY | Freq: Two times a day (BID) | RESPIRATORY_TRACT | Status: DC
  Administered 2018-03-26 – 2018-03-27 (×2): 2 via RESPIRATORY_TRACT
  Filled 2018-03-26: qty 8.8

## 2018-03-26 MED ORDER — BENZONATATE 100 MG PO CAPS
100.0000 mg | ORAL_CAPSULE | Freq: Three times a day (TID) | ORAL | Status: DC | PRN
  Filled 2018-03-26: qty 1

## 2018-03-26 MED ORDER — MORPHINE SULFATE (PF) 4 MG/ML IV SOLN
4.0000 mg | Freq: Once | INTRAVENOUS | Status: AC
  Administered 2018-03-26: 4 mg via INTRAVENOUS
  Filled 2018-03-26: qty 1

## 2018-03-26 MED ORDER — ARIPIPRAZOLE 5 MG PO TABS
5.0000 mg | ORAL_TABLET | Freq: Every day | ORAL | Status: DC
  Administered 2018-03-27: 5 mg via ORAL
  Filled 2018-03-26 (×2): qty 1

## 2018-03-26 MED ORDER — TIOTROPIUM BROMIDE MONOHYDRATE 18 MCG IN CAPS: 18.0000 ug | ORAL_CAPSULE | Freq: Every day | RESPIRATORY_TRACT | Status: DC

## 2018-03-26 MED ORDER — ENOXAPARIN SODIUM 40 MG/0.4ML ~~LOC~~ SOLN
40.0000 mg | SUBCUTANEOUS | Status: DC
  Administered 2018-03-26 – 2018-03-27 (×2): 40 mg via SUBCUTANEOUS
  Filled 2018-03-26 (×2): qty 0.4

## 2018-03-26 MED ORDER — IPRATROPIUM-ALBUTEROL 0.5-2.5 (3) MG/3ML IN SOLN
3.0000 mL | Freq: Once | RESPIRATORY_TRACT | Status: AC
  Administered 2018-03-26: 3 mL via RESPIRATORY_TRACT
  Filled 2018-03-26: qty 3

## 2018-03-26 MED ORDER — PREDNISONE 20 MG PO TABS
40.0000 mg | ORAL_TABLET | Freq: Every day | ORAL | Status: DC
  Administered 2018-03-27: 40 mg via ORAL
  Filled 2018-03-26: qty 2

## 2018-03-26 MED ORDER — ESCITALOPRAM OXALATE 20 MG PO TABS
20.0000 mg | ORAL_TABLET | Freq: Every day | ORAL | Status: DC
  Administered 2018-03-27: 20 mg via ORAL
  Filled 2018-03-26 (×2): qty 1

## 2018-03-26 MED ORDER — POLYETHYLENE GLYCOL 3350 17 G PO PACK: 17.0000 g | PACK | Freq: Every day | ORAL | Status: DC | PRN

## 2018-03-26 MED ORDER — TAMSULOSIN HCL 0.4 MG PO CAPS
0.4000 mg | ORAL_CAPSULE | Freq: Every day | ORAL | Status: DC
  Filled 2018-03-26 (×3): qty 1

## 2018-03-26 MED ORDER — ALBUTEROL SULFATE (2.5 MG/3ML) 0.083% IN NEBU: 2.5000 mg | INHALATION_SOLUTION | RESPIRATORY_TRACT | Status: DC | PRN

## 2018-03-26 MED ORDER — PRAVASTATIN SODIUM 10 MG PO TABS
20.0000 mg | ORAL_TABLET | Freq: Every day | ORAL | Status: DC
  Administered 2018-03-26 – 2018-03-27 (×2): 20 mg via ORAL
  Filled 2018-03-26 (×2): qty 2

## 2018-03-26 NOTE — ED Notes (Signed)
Pt states that he has been having cramps in both lower legs x 1 week.

## 2018-03-26 NOTE — Progress Notes (Signed)
Pt arrived on BIPAP per EMS pt looks better so pt was taken off BIPAP and placed on 3L  per home reg.

## 2018-03-26 NOTE — ED Triage Notes (Signed)
Shob worse since this morning. Hx of COPD. 88% on 3L at home, pt wears 3L at all times. Placed on bipap, given 10mg  of albuterol, 1 of atrovent and 125 solumedrol. Speaking in 4-5 word sentences now and feels better. Taken off of CPAP and placed on 6L [place], 100% on 6L

## 2018-03-26 NOTE — ED Provider Notes (Signed)
Blood pressure 103/90, pulse 83, temperature 98 F (36.7 C), temperature source Oral, resp. rate 18, height 5\' 11"  (1.803 m), weight 84.8 kg, SpO2 93 %.  Assuming care from Dr. Sherry Ruffing.  In short, Taylor [name] is a 63 y.o. male with a chief complaint of Shortness of Breath .  Refer to the original H&P for additional details.  The current plan of care is to F/u D-dimer, ambulate, LE [place] results, and reassess. Now back on home O2.   D-dimer and B/L LE [place] negative. Patient continues to feel SOB and has wheezing.   Discussed patient's case with Hospitalist to request admission. Patient and family (if present) updated with plan. Care transferred to Hospitalist service.  I reviewed all nursing notes, vitals, pertinent old records, EKGs, labs, imaging (as available).   EKG Interpretation  Date/Time:  Thursday March 26 2018 14:15:15 EST Ventricular Rate:  93 PR Interval:    QRS Duration: 147 QT Interval:  424 QTC Calculation: 522 R Axis:   -71 Text Interpretation:  Ectopic atrial rhythm Nonspecific IVCD with LAD Inferior infarct, old Probable anterolateral infarct, recent Baseline wander in lead(s) V6 when compared to prior, slower rate.  No STEMI Confirmed by Antony Blackbird [phone]) on 03/26/2018 2:55:40 PM          Long, Wonda Olds, MD 03/26/18 [phone]
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
## 2018-03-26 NOTE — ED Notes (Signed)
ED TO INPATIENT HANDOFF REPORT  ED Nurse Name and Phone #: [phone] Magda Kiel Name/Age/Gender Taylor [name] 63 y.o. male Room/Bed: TRACC/TRACC  Code Status   Code Status: Prior  Home/SNF/Other   Is this baseline? yes  Triage Complete: Triage complete  Chief Complaint sob  Triage Note Shob worse since this morning. Hx of COPD. 88% on 3L at home, pt wears 3L at all times. Placed on bipap, given 10mg  of albuterol, 1 of atrovent and 125 solumedrol. Speaking in 4-5 word sentences now and feels better. Taken off of CPAP and placed on 6L [place], 100% on 6L   Allergies Allergies  Allergen Reactions  . Codeine Nausea And Vomiting    Level of Care/Admitting Diagnosis ED Disposition    ED Disposition Condition [facility] Area: [place] [100100]  Level of Care: Med-Surg [16]  I expect the patient will be discharged within 24 hours: Yes  LOW acuity---Tx typically complete <24 hrs---ACUTE conditions typically can be evaluated <24 hours---LABS likely to return to acceptable levels <24 hours---IS near functional baseline---EXPECTED to return to current living arrangement---NOT newly hypoxic: Meets criteria for 5C-Observation unit  Diagnosis: COPD exacerbation [facility]) [440347]  Admitting Physician: Cristal Ford [phone]  Attending Physician: Cristal Ford [phone]  PT Class (Do Not Modify): Observation [104]  PT Acc Code (Do Not Modify): Observation [10022]       B Medical/Surgery History Past Medical History:  Diagnosis Date  . Acute on chronic respiratory failure ([place])   . Asthma   . Bipolar 1 disorder ([place]) 02/05/2012  . Colon cancer ([place]) 04/11/11   adenocarcinoma of colon, 7/19 nodes pos.FINISHED CHEMO/DR. SHERRILL  . COPD (chronic obstructive pulmonary disease) ([place])    SMOKER  . Depression   . Dyspnea   . Emphysema of lung ([place])   . Full dentures   . Hemorrhoids   . On home oxygen therapy    "3L; 24/7" (05/29/2017)  .  Oxygen deficiency   . Pneumonia ~ 2016   "double pneumonia"  . Prostate cancer ([place])    Gleason score = 7, supposed to have radiation therapy but he has not followed up (05/29/2017)  . Rib fractures    hx of   Past Surgical History:  Procedure Laterality Date  . COLON SURGERY  04/11/11   Sigmoid colectomy  . COLOSTOMY REVISION  04/11/2011   Procedure: COLON RESECTION SIGMOID;  Surgeon: [name], MD;  Location: [facility];  Service: General;  Laterality: N/A;  low anterior colon resection   . GOLD SEED IMPLANT N/A 02/24/2018   Procedure: GOLD SEED IMPLANT;  Surgeon: [name], MD;  Location: [facility];  Service: Urology;  Laterality: N/A;  . HERNIA REPAIR    . INGUINAL HERNIA REPAIR Right 05/01/2012   Procedure: HERNIA REPAIR INGUINAL ADULT;  Surgeon: [name], MD;  Location: [facility];  Service: General;  Laterality: Right;  . INSERTION OF MESH Right 05/01/2012   Procedure: INSERTION OF MESH;  Surgeon: [name], MD;  Location: [facility];  Service: General;  Laterality: Right;  . PORT-A-CATH REMOVAL Left 05/01/2012   Procedure: REMOVAL Infusion Port;  Surgeon: [name], MD;  Location: [facility];  Service: General;  Laterality: Left;  . PORTACATH PLACEMENT  05/02/2011   Procedure: INSERTION PORT-A-CATH;  Surgeon: [name], MD;  Location: [facility];  Service: General;  Laterality: N/A;  . PROSTATE BIOPSY    . SPACE OAR INSTILLATION N/A 02/24/2018  Procedure: SPACE OAR INSTILLATION;  Surgeon: [name], MD;  Location: [facility];  Service: Urology;  Laterality: N/A;     A IV Location/Drains/Wounds Patient Lines/Drains/Airways Status   Active Line/Drains/Airways    None          Intake/Output Last 24 hours No intake or output data in the 24 hours ending 03/26/18 1954  Labs/Imaging Results for orders placed or performed during the hospital encounter of 03/26/18 (from the past 48 hour(s))  CBC with Differential     Status: Abnormal   Collection Time: 03/26/18  2:25 PM  Result Value  Ref Range   WBC 5.0 4.0 - 10.5 K/uL   RBC 3.90 (L) 4.22 - 5.81 MIL/uL   Hemoglobin 11.5 (L) 13.0 - 17.0 g/dL   HCT 36.3 (L) 39.0 - 52.0 %   MCV 93.1 80.0 - 100.0 fL   MCH 29.5 26.0 - 34.0 pg   MCHC 31.7 30.0 - 36.0 g/dL   RDW 13.0 11.5 - 15.5 %   Platelets 170 150 - 400 K/uL   nRBC 0.0 0.0 - 0.2 %   Neutrophils Relative % 64 %   Neutro Abs 3.2 1.7 - 7.7 K/uL   Lymphocytes Relative 23 %   Lymphs Abs 1.1 0.7 - 4.0 K/uL   Monocytes Relative 10 %   Monocytes Absolute 0.5 0.1 - 1.0 K/uL   Eosinophils Relative 2 %   Eosinophils Absolute 0.1 0.0 - 0.5 K/uL   Basophils Relative 0 %   Basophils Absolute 0.0 0.0 - 0.1 K/uL   Immature Granulocytes 1 %   Abs Immature Granulocytes 0.04 0.00 - 0.07 K/uL    Comment: Performed at [facility], [address] [place], [place] [address]  Comprehensive metabolic panel     Status: Abnormal   Collection Time: 03/26/18  2:25 PM  Result Value Ref Range   Sodium 138 135 - 145 mmol/L   Potassium 4.0 3.5 - 5.1 mmol/L   Chloride 95 (L) 98 - 111 mmol/L   CO2 32 22 - 32 mmol/L   Glucose, Bld 122 (H) 70 - 99 mg/dL   BUN 8 8 - 23 mg/dL   Creatinine, Ser 0.76 0.61 - 1.24 mg/dL   Calcium 8.9 8.9 - 10.3 mg/dL   Total Protein 6.1 (L) 6.5 - 8.1 g/dL   Albumin 3.9 3.5 - 5.0 g/dL   AST 22 15 - 41 U/L   ALT 21 0 - 44 U/L   Alkaline Phosphatase 42 38 - 126 U/L   Total Bilirubin 0.9 0.3 - 1.2 mg/dL   GFR calc non Af Amer >60 >60 mL/min   GFR calc Af Amer >60 >60 mL/min   Anion gap 11 5 - 15    Comment: Performed at [facility], [place] [address] [place], [place] [address]  Brain natriuretic peptide     Status: None   Collection Time: 03/26/18  2:25 PM  Result Value Ref Range   B Natriuretic Peptide 24.2 0.0 - 100.0 pg/mL    Comment: Performed at [facility] [address] [place], [place] [address]  Magnesium     Status: None   Collection Time: 03/26/18  2:25 PM  Result Value Ref Range   Magnesium 1.7 1.7 - 2.4 mg/dL    Comment:  Performed at [facility], [place] [address], [place],  [address]  I-stat troponin, ED     Status: None   Collection Time: 03/26/18  2:59 PM  Result Value Ref Range   Troponin i, poc 0.00 0.00 - 0.08 ng/mL   Comment 3            Comment: Due to the release kinetics of cTnI, a negative result within the first hours of the onset of symptoms does not rule out myocardial infarction with certainty. If myocardial infarction is still suspected, repeat the test at appropriate intervals.   D-dimer, quantitative (not at [facility])     Status: None   Collection Time: 03/26/18  3:49 PM  Result Value Ref Range   D-Dimer, Quant 0.30 0.00 - 0.50 ug/mL-FEU    Comment: (NOTE) At the manufacturer cut-off of 0.50 ug/mL FEU, this assay has been documented to exclude PE with a sensitivity and negative predictive value of 97 to 99%.  At this time, this assay has not been approved by the FDA to exclude DVT/VTE. Results should be correlated with clinical presentation. Performed at [facility], [place] [address], [place], [place] [address]    Dg Chest 2 View  Result Date: 03/26/2018 CLINICAL DATA:  Sob /weaknessLegs cramping And shakingX smoker 3 yearsSymptoms 4 days and getting worseTaking radiation treatments for prostrate cancerHx of colon cancerUses O2 at home EXAM: CHEST - 2 VIEW COMPARISON:  03/07/2018 and prior exams. FINDINGS: Normal heart, mediastinum and hila. Lungs are hyperexpanded with relative lucency in the upper lobes consistent with emphysema. Lungs are clear. No pleural effusion or pneumothorax. Skeletal structures are demineralized. There old healed right rib fractures. IMPRESSION: 1. No acute cardiopulmonary disease. 2. COPD/emphysema. Electronically Signed   By: Lajean Manes M.D.   On: 03/26/2018 15:27   Vas [place] Lower Extremity Venous (dvt) (only [facility])  Result Date: 03/26/2018  Lower Venous Study Indications: Edema.  Performing Technologist: Abram Sander RVS  Examination  Guidelines: A complete evaluation includes B-mode imaging, spectral Doppler, color Doppler, and power Doppler as needed of all accessible portions of each vessel. Bilateral testing is considered an integral part of a complete examination. Limited examinations for reoccurring indications may be performed as noted.  Right Venous Findings: +---------+---------------+---------+-----------+----------+--------------+          CompressibilityPhasicitySpontaneityPropertiesSummary        +---------+---------------+---------+-----------+----------+--------------+ CFV      Full           Yes      Yes                                 +---------+---------------+---------+-----------+----------+--------------+ SFJ      Full                                                        +---------+---------------+---------+-----------+----------+--------------+ FV Prox  Full                                                        +---------+---------------+---------+-----------+----------+--------------+ FV Mid   Full                                                        +---------+---------------+---------+-----------+----------+--------------+  FV DistalFull                                                        +---------+---------------+---------+-----------+----------+--------------+ PFV      Full                                                        +---------+---------------+---------+-----------+----------+--------------+ POP      Full           Yes      Yes                                 +---------+---------------+---------+-----------+----------+--------------+ PTV      Full                                                        +---------+---------------+---------+-----------+----------+--------------+ PERO                                                  Not visualized +---------+---------------+---------+-----------+----------+--------------+  Left Venous  Findings: +---------+---------------+---------+-----------+----------+--------------+          CompressibilityPhasicitySpontaneityPropertiesSummary        +---------+---------------+---------+-----------+----------+--------------+ CFV      Full           Yes      Yes                                 +---------+---------------+---------+-----------+----------+--------------+ SFJ      Full                                                        +---------+---------------+---------+-----------+----------+--------------+ FV Prox  Full                                                        +---------+---------------+---------+-----------+----------+--------------+ FV Mid   Full                                                        +---------+---------------+---------+-----------+----------+--------------+ FV DistalFull                                                        +---------+---------------+---------+-----------+----------+--------------+  PFV      Full                                                        +---------+---------------+---------+-----------+----------+--------------+ POP      Full           Yes      Yes                                 +---------+---------------+---------+-----------+----------+--------------+ PTV      Full                                                        +---------+---------------+---------+-----------+----------+--------------+ PERO                                                  Not visualized +---------+---------------+---------+-----------+----------+--------------+    Summary: Right: There is no evidence of deep vein thrombosis in the lower extremity. No cystic structure found in the popliteal fossa. Left: There is no evidence of deep vein thrombosis in the lower extremity. No cystic structure found in the popliteal fossa.  *See table(s) above for measurements and observations. Electronically signed by  Servando Snare MD on 03/26/2018 at 5:51:16 PM.    Final     Pending Labs Unresulted Labs (From admission, onward)    Start     Ordered   Signed and Held  CBC  (enoxaparin (LOVENOX)    CrCl >/= 30 ml/min)  Once,   R    Comments:  Baseline for enoxaparin therapy IF NOT ALREADY DRAWN.  Notify MD if PLT < 100 K.    Signed and Held   Signed and Held  Creatinine, serum  (enoxaparin (LOVENOX)    CrCl >/= 30 ml/min)  Once,   R    Comments:  Baseline for enoxaparin therapy IF NOT ALREADY DRAWN.    Signed and Held   Signed and Held  Creatinine, serum  (enoxaparin (LOVENOX)    CrCl >/= 30 ml/min)  Weekly,   R    Comments:  while on enoxaparin therapy    Signed and Held   Signed and Held  Basic metabolic panel  Tomorrow morning,   R     Signed and Held   Signed and Held  CBC  Tomorrow morning,   R     Signed and Held   Signed and Held  Magnesium  Tomorrow morning,   R     Signed and Held          Vitals/Pain Today's Vitals   03/26/18 1900 03/26/18 1905 03/26/18 1930 03/26/18 1954  BP: 108/71  104/65   Pulse: 82  84   Resp: 20  18   Temp:      TempSrc:      SpO2: 95%  97%   Weight:      Height:      PainSc:  0-No pain  0-No pain    Isolation Precautions  No active isolations  Medications Medications  ipratropium-albuterol (DUONEB) 0.5-2.5 (3) MG/3ML nebulizer solution 3 mL (3 mLs Nebulization Given 03/26/18 1435)  ipratropium-albuterol (DUONEB) 0.5-2.5 (3) MG/3ML nebulizer solution 3 mL (3 mLs Nebulization Given 03/26/18 1752)  morphine 4 MG/ML injection 4 mg (4 mg Intravenous Given 03/26/18 1752)  ondansetron (ZOFRAN) injection 4 mg (4 mg Intravenous Given 03/26/18 1752)    Mobility  Low fall risk   Focused Assessments    R Recommendations: See Admitting Provider Note  Report given to:   Additional Note  Pt on 3L o2 at home

## 2018-03-26 NOTE — H&P (Signed)
[facility] History and Physical  TABITHA TUPPER WCB:[id number] DOB: 13-Oct-1955 DOA: 03/26/2018  PCP: Clent Demark, PA-C  Patient coming from: Home  Chief Complaint: Shortness of breath  HPI: Taylor [name] is a 63 y.o. male with a medical history of COPD, bipolar disorder, depression, prostate cancer, diabetes, who presented to the emergency department with complaints of shortness of breath.  She uses 3 L of home oxygen.  He presented via EMS with shortness of breath and required BiPAP.  He states he has been coughing and his shortness of breath is worsened by coughing, she has been dry nonproductive.  He has used an inhaler at home with no improvement of his symptoms.  Patient has run out of his Ruthe Mannan but continue to use his Spiriva.  Patient was recently admitted 2 weeks ago for similar presentation.  Currently he denies any chest pain, abdominal pain, nausea or vomiting, diarrhea or constipation, dizziness or headache.  He does complain of lower extremity pain and cramping.  Denies recent travel or ill contacts.  ED Course: Arrived with hypoxia, and required BiPAP.  Was given nebulizer treatment along with pain control.  Lower extremity Doppler and d-dimer obtained for lower extremity pain.  Chest x-ray unremarkable for infection.  TRH called for admission of COPD exacerbation.  Review of Systems:  All other systems reviewed and are negative.   Past Medical History:  Diagnosis Date  . Acute on chronic respiratory failure ([place])   . Asthma   . Bipolar 1 disorder ([place]) 02/05/2012  . Colon cancer ([place]) 04/11/11   adenocarcinoma of colon, 7/19 nodes pos.FINISHED CHEMO/DR. SHERRILL  . COPD (chronic obstructive pulmonary disease) ([place])    SMOKER  . Depression   . Dyspnea   . Emphysema of lung ([place])   . Full dentures   . Hemorrhoids   . On home oxygen therapy    "3L; 24/7" (05/29/2017)  . Oxygen deficiency   . Pneumonia ~ 2016   "double pneumonia"  . Prostate cancer  ([place])    Gleason score = 7, supposed to have radiation therapy but he has not followed up (05/29/2017)  . Rib fractures    hx of    Past Surgical History:  Procedure Laterality Date  . COLON SURGERY  04/11/11   Sigmoid colectomy  . COLOSTOMY REVISION  04/11/2011   Procedure: COLON RESECTION SIGMOID;  Surgeon: [name], MD;  Location: [facility];  Service: General;  Laterality: N/A;  low anterior colon resection   . GOLD SEED IMPLANT N/A 02/24/2018   Procedure: GOLD SEED IMPLANT;  Surgeon: [name], MD;  Location: [facility];  Service: Urology;  Laterality: N/A;  . HERNIA REPAIR    . INGUINAL HERNIA REPAIR Right 05/01/2012   Procedure: HERNIA REPAIR INGUINAL ADULT;  Surgeon: [name], MD;  Location: [facility];  Service: General;  Laterality: Right;  . INSERTION OF MESH Right 05/01/2012   Procedure: INSERTION OF MESH;  Surgeon: [name], MD;  Location: [facility];  Service: General;  Laterality: Right;  . PORT-A-CATH REMOVAL Left 05/01/2012   Procedure: REMOVAL Infusion Port;  Surgeon: [name], MD;  Location: [facility];  Service: General;  Laterality: Left;  . PORTACATH PLACEMENT  05/02/2011   Procedure: INSERTION PORT-A-CATH;  Surgeon: [name], MD;  Location: [facility];  Service: General;  Laterality: N/A;  . PROSTATE BIOPSY    . SPACE OAR INSTILLATION N/A 02/24/2018   Procedure: SPACE OAR INSTILLATION;  Surgeon: [name],  MD;  Location: [facility];  Service: Urology;  Laterality: N/A;    Social History:  reports that he quit smoking about 10 months ago. His smoking use included cigarettes. He has a 4.60 pack-year smoking history. He has quit using smokeless tobacco.  His smokeless tobacco use included chew. He reports previous alcohol use. He reports that he does not use drugs.   Allergies  Allergen Reactions  . Codeine Nausea And Vomiting    Family History  Problem Relation Age of Onset  . Heart disease Father   . Lung cancer Maternal Uncle        smoked     Prior to Admission  medications   Medication Sig Start Date End Date Taking? Authorizing Provider  albuterol (PROVENTIL) (2.5 MG/3ML) 0.083% nebulizer solution Take 3 mLs (2.5 mg total) by nebulization every 4 (four) hours as needed for wheezing or shortness of breath (if you can't catch your breath). 02/10/18  Yes [name], MD  ARIPiprazole (ABILIFY) 5 MG tablet Take 5 mg by mouth daily.   Yes [provider]  benzonatate (TESSALON) 100 MG capsule Take 1 capsule (100 mg total) by mouth every 8 (eight) hours. Patient taking differently: Take 100 mg by mouth every 8 (eight) hours as needed for cough.  01/19/18  Yes [name], MD  escitalopram (LEXAPRO) 20 MG tablet Take 20 mg by mouth daily.   Yes [provider]  lovastatin (MEVACOR) 20 MG tablet Take 1 tablet (20 mg total) by mouth at bedtime. 10/27/17  Yes Clent Demark, PA-C  metFORMIN (GLUCOPHAGE) 1000 MG tablet Take 1 tablet (1,000 mg total) by mouth 2 (two) times daily with a meal. 10/27/17  Yes Clent Demark, PA-C  mometasone-formoterol [facility]) 100-5 MCG/ACT AERO Inhale 2 puffs into the lungs 2 (two) times daily.   Yes [provider]  OXYGEN Inhale 3 L into the lungs continuous.    Yes [provider]  polyethylene glycol (MIRALAX / GLYCOLAX) packet Take 17 g by mouth daily. Patient taking differently: Take 17 g by mouth daily as needed for mild constipation.  01/19/18  Yes [name], MD  senna-docusate (SENOKOT-S) 8.6-50 MG tablet Take 1 tablet by mouth 2 (two) times daily. Patient taking differently: Take 1 tablet by mouth daily as needed for mild constipation.  01/19/18  Yes [name], MD  tamsulosin (FLOMAX) 0.4 MG CAPS capsule Take 1 capsule (0.4 mg total) by mouth daily. 03/13/18  Yes Shelly Coss, MD  tiotropium (SPIRIVA) 18 MCG inhalation capsule Place 18 mcg into inhaler and inhale daily.   Yes [provider]  Blood Glucose Monitoring Suppl (ACCU-CHEK AVIVA) device Use as  instructed 10/27/17 10/27/18  Clent Demark, PA-C  Fluticasone-Umeclidin-Vilant (TRELEGY ELLIPTA) 100-62.5-25 MCG/INH AEPB Inhale 1 puff into the lungs daily. Patient not taking: Reported on 03/08/2018 02/02/18   Ladell Pier, MD  folic acid (FOLVITE) 1 MG tablet Take 1 tablet (1 mg total) by mouth 3 (three) times a week. Patient not taking: Reported on 03/08/2018 01/19/18   [name], MD  glucose blood (ACCU-CHEK AVIVA) test strip 1 each by Other route as needed for other. Use as instructed 10/27/17   Clent Demark, PA-C  Lancets [facility]) lancets 1 each by Other route 2 (two) times daily. Use as instructed 10/27/17   Clent Demark, PA-C  pantoprazole (PROTONIX) 40 MG tablet Take 1 tablet (40 mg total) by mouth daily. Patient not taking: Reported on 02/10/2018 01/19/18  [name], MD    Physical Exam: Vitals:   03/26/18 1730 03/26/18 1745  BP: 126/80 122/85  Pulse: 92 88  Resp: (!) 25 (!) 24  Temp:    SpO2: 96% 96%     General: Well developed, well nourished, NAD, appears stated age  HEENT: NCAT, PERRLA, EOMI, Anicteic Sclera, mucous membranes moist.   Neck: Supple, no JVD, no masses  Cardiovascular: S1 S2 auscultated, no rubs, murmurs or gallops. Regular rate and rhythm.  Respiratory: Diminished breath sounds, no expiratory wheezing on examination.  Work of breathing appears to be stable, patient able to complete full sentences.  Abdomen: Soft, nontender, nondistended, + bowel sounds  Extremities: warm dry without cyanosis clubbing or edema  Neuro: AAOx3, cranial nerves grossly intact. Strength 5/5 in patient's upper and lower extremities bilaterally  Skin: Without rashes exudates or nodules  Psych: Normal affect and demeanor with intact judgement and insight  Labs on Admission: I have personally reviewed following labs and imaging studies CBC: Recent Labs  Lab 03/26/18 1425  WBC 5.0  NEUTROABS 3.2  HGB 11.5*  HCT 36.3*  MCV  93.1  PLT 671   Basic Metabolic Panel: Recent Labs  Lab 03/26/18 1425  NA 138  K 4.0  CL 95*  CO2 32  GLUCOSE 122*  BUN 8  CREATININE 0.76  CALCIUM 8.9  MG 1.7   GFR: Estimated Creatinine Clearance: 100.7 mL/min (by C-G formula based on SCr of 0.76 mg/dL). Liver Function Tests: Recent Labs  Lab 03/26/18 1425  AST 22  ALT 21  ALKPHOS 42  BILITOT 0.9  PROT 6.1*  ALBUMIN 3.9   No results for input(s): LIPASE, AMYLASE in the last 168 hours. No results for input(s): AMMONIA in the last 168 hours. Coagulation Profile: No results for input(s): INR, PROTIME in the last 168 hours. Cardiac Enzymes: No results for input(s): CKTOTAL, CKMB, CKMBINDEX, TROPONINI in the last 168 hours. BNP (last 3 results) No results for input(s): PROBNP in the last 8760 hours. HbA1C: No results for input(s): HGBA1C in the last 72 hours. CBG: No results for input(s): GLUCAP in the last 168 hours. Lipid Profile: No results for input(s): CHOL, HDL, LDLCALC, TRIG, CHOLHDL, LDLDIRECT in the last 72 hours. Thyroid Function Tests: No results for input(s): TSH, T4TOTAL, FREET4, T3FREE, THYROIDAB in the last 72 hours. Anemia Panel: No results for input(s): VITAMINB12, FOLATE, FERRITIN, TIBC, IRON, RETICCTPCT in the last 72 hours. Urine analysis:    Component Value Date/Time   COLORURINE YELLOW 01/17/2018 1731   APPEARANCEUR CLEAR 01/17/2018 1731   LABSPEC 1.036 (H) 01/17/2018 1731   PHURINE 5.0 01/17/2018 1731   GLUCOSEU >=500 (A) 01/17/2018 1731   HGBUR NEGATIVE 01/17/2018 1731   BILIRUBINUR NEGATIVE 01/17/2018 1731   KETONESUR 5 (A) 01/17/2018 1731   PROTEINUR NEGATIVE 01/17/2018 1731   UROBILINOGEN 1.0 01/09/2013 1843   NITRITE NEGATIVE 01/17/2018 1731   LEUKOCYTESUR NEGATIVE 01/17/2018 1731   Sepsis Labs: @LABRCNTIP (procalcitonin:4,lacticidven:4) )No results found for this or any previous visit (from the past 240 hour(s)).   Radiological Exams on Admission: Dg Chest 2 View  Result  Date: 03/26/2018 CLINICAL DATA:  Sob /weaknessLegs cramping And shakingX smoker 3 yearsSymptoms 4 days and getting worseTaking radiation treatments for prostrate cancerHx of colon cancerUses O2 at home EXAM: CHEST - 2 VIEW COMPARISON:  03/07/2018 and prior exams. FINDINGS: Normal heart, mediastinum and hila. Lungs are hyperexpanded with relative lucency in the upper lobes consistent with emphysema. Lungs are clear. No pleural effusion or pneumothorax. Skeletal structures  are demineralized. There old healed right rib fractures. IMPRESSION: 1. No acute cardiopulmonary disease. 2. COPD/emphysema. Electronically Signed   By: Lajean Manes M.D.   On: 03/26/2018 15:27   Vas [place] Lower Extremity Venous (dvt) (only [facility])  Result Date: 03/26/2018  Lower Venous Study Indications: Edema.  Performing Technologist: Abram Sander RVS  Examination Guidelines: A complete evaluation includes B-mode imaging, spectral Doppler, color Doppler, and power Doppler as needed of all accessible portions of each vessel. Bilateral testing is considered an integral part of a complete examination. Limited examinations for reoccurring indications may be performed as noted.  Right Venous Findings: +---------+---------------+---------+-----------+----------+--------------+          CompressibilityPhasicitySpontaneityPropertiesSummary        +---------+---------------+---------+-----------+----------+--------------+ CFV      Full           Yes      Yes                                 +---------+---------------+---------+-----------+----------+--------------+ SFJ      Full                                                        +---------+---------------+---------+-----------+----------+--------------+ FV Prox  Full                                                        +---------+---------------+---------+-----------+----------+--------------+ FV Mid   Full                                                         +---------+---------------+---------+-----------+----------+--------------+ FV DistalFull                                                        +---------+---------------+---------+-----------+----------+--------------+ PFV      Full                                                        +---------+---------------+---------+-----------+----------+--------------+ POP      Full           Yes      Yes                                 +---------+---------------+---------+-----------+----------+--------------+ PTV      Full                                                        +---------+---------------+---------+-----------+----------+--------------+  PERO                                                  Not visualized +---------+---------------+---------+-----------+----------+--------------+  Left Venous Findings: +---------+---------------+---------+-----------+----------+--------------+          CompressibilityPhasicitySpontaneityPropertiesSummary        +---------+---------------+---------+-----------+----------+--------------+ CFV      Full           Yes      Yes                                 +---------+---------------+---------+-----------+----------+--------------+ SFJ      Full                                                        +---------+---------------+---------+-----------+----------+--------------+ FV Prox  Full                                                        +---------+---------------+---------+-----------+----------+--------------+ FV Mid   Full                                                        +---------+---------------+---------+-----------+----------+--------------+ FV DistalFull                                                        +---------+---------------+---------+-----------+----------+--------------+ PFV      Full                                                         +---------+---------------+---------+-----------+----------+--------------+ POP      Full           Yes      Yes                                 +---------+---------------+---------+-----------+----------+--------------+ PTV      Full                                                        +---------+---------------+---------+-----------+----------+--------------+ PERO  Not visualized +---------+---------------+---------+-----------+----------+--------------+    Summary: Right: There is no evidence of deep vein thrombosis in the lower extremity. No cystic structure found in the popliteal fossa. Left: There is no evidence of deep vein thrombosis in the lower extremity. No cystic structure found in the popliteal fossa.  *See table(s) above for measurements and observations. Electronically signed by Servando Snare MD on 03/26/2018 at 5:51:16 PM.    Final     EKG: Independently reviewed.  Sinus rhythm rate 93  Assessment/Plan  Recurrent COPD exacerbation with chronic hypoxic respiratory failure -Patient recently admitted approximately 2 weeks ago for similar situation -Currently patient afebrile no leukocytosis -Chest x-ray reviewed and unremarkable for infection -Patient required BiPAP while in route via EMS and was given several nebulizer treatments as well as steroids.  He was then transitioned to 3 L of home oxygen which he wears at home. -Admits to running out of home medications including Dulera and is down to 1 oxygen tank at home -Patient able to complete sentences without hesitation -We will place patient on prednisone, nebulizer treatments, azithromycin, supplemental oxygen to maintain saturations above 88% -Patient has had multiple ED visits as well as hospital admissions in the last 6 months.  Will consult case management.  Lower extremity pain -D-dimer unremarkable -Lower extremity Doppler unremarkable for DVT -potassium  4 -Magnesium 1.7, although within normal limits, will give an additional dose given his leg cramps -will place on tramadol  Diabetes mellitus, type II -Hold metformin, placed on insulin sliding scale with CBG monitoring -Last hemoglobin A1c was 7.7 on 02/19/2018  History of prostate cancer -Continue to follow-up with urology -Continue flomax  Tobacco abuse -Patient denies use of tobacco products in the last 2 weeks  Bipolar disorder -continue Abilify  DVT prophylaxis: Lovenox  Code Status: Full  Family Communication: None at bedside admission, patients condition and plan of care including tests being ordered have been discussed with the patient, who indicates understanding and agrees with the plan and Code Status.  Disposition Plan: Home  Consults called: None  Admission status: Observation  Time spent: 70 minutes  Brodee Mauritz D.O. [facility]  Between 7am to 7pm - Please see pager noted on [email or web address]  After 7pm go to [email or web address] 03/26/2018, 6:08 PM

## 2018-03-26 NOTE — Progress Notes (Signed)
Lower extremity venous has been completed.   Preliminary results in CV Proc.   Abram Sander 03/26/2018 4:15 PM

## 2018-03-27 ENCOUNTER — Ambulatory Visit: Payer: Medicaid Other

## 2018-03-27 DIAGNOSIS — R05 Cough: Secondary | ICD-10-CM | POA: Diagnosis not present

## 2018-03-27 DIAGNOSIS — R0902 Hypoxemia: Secondary | ICD-10-CM | POA: Diagnosis not present

## 2018-03-27 DIAGNOSIS — J9611 Chronic respiratory failure with hypoxia: Secondary | ICD-10-CM | POA: Diagnosis not present

## 2018-03-27 DIAGNOSIS — J441 Chronic obstructive pulmonary disease with (acute) exacerbation: Secondary | ICD-10-CM | POA: Diagnosis not present

## 2018-03-27 LAB — BASIC METABOLIC PANEL
Anion gap: 7 (ref 5–15)
BUN: 12 mg/dL (ref 8–23)
CO2: 31 mmol/L (ref 22–32)
Calcium: 8.4 mg/dL — ABNORMAL LOW (ref 8.9–10.3)
Chloride: 97 mmol/L — ABNORMAL LOW (ref 98–111)
Creatinine, Ser: 0.89 mg/dL (ref 0.61–1.24)
GFR calc Af Amer: >60 mL/min (ref >60)
GFR calc non Af Amer: >60 mL/min (ref >60)
Glucose, Bld: 224 mg/dL — ABNORMAL HIGH (ref 70–99)
Potassium: 4.4 mmol/L (ref 3.5–5.1)
Sodium: 135 mmol/L (ref 135–145)

## 2018-03-27 LAB — CBC
HCT: 32.9 % — ABNORMAL LOW (ref 39.0–52.0)
Hemoglobin: 10.8 g/dL — ABNORMAL LOW (ref 13.0–17.0)
MCH: 29.8 pg (ref 26.0–34.0)
MCHC: 32.8 g/dL (ref 30.0–36.0)
MCV: 90.6 fL (ref 80.0–100.0)
PLATELETS: 155 10*3/uL (ref 150–400)
RBC: 3.63 MIL/uL — ABNORMAL LOW (ref 4.22–5.81)
RDW: 12.8 % (ref 11.5–15.5)
WBC: 4.8 10*3/uL (ref 4.0–10.5)
nRBC: 0 % (ref 0.0–0.2)

## 2018-03-27 LAB — GLUCOSE, CAPILLARY
GLUCOSE-CAPILLARY: 191 mg/dL — AB (ref 70–99)
Glucose-Capillary: 93 mg/dL (ref 70–99)

## 2018-03-27 LAB — MAGNESIUM: Magnesium: 1.8 mg/dL (ref 1.7–2.4)

## 2018-03-27 MED ORDER — TRAMADOL HCL 50 MG PO TABS
50.0000 mg | ORAL_TABLET | Freq: Four times a day (QID) | ORAL | Status: DC | PRN
  Filled 2018-03-27: qty 1

## 2018-03-27 MED ORDER — MAGNESIUM SULFATE 2 GM/50ML IV SOLN
2.0000 g | Freq: Once | INTRAVENOUS | Status: DC
  Filled 2018-03-27: qty 50

## 2018-03-27 MED ORDER — PREDNISONE 20 MG PO TABS: 40.0000 mg | ORAL_TABLET | Freq: Every day | ORAL | 0 refills | Status: AC

## 2018-03-27 MED ORDER — ENSURE ENLIVE PO LIQD: 237.0000 mL | Freq: Every day | ORAL | Status: DC

## 2018-03-27 NOTE — Progress Notes (Signed)
Initial Nutrition Assessment  Nutrition Brief Note  RD consulted to assess the patient's nutritional status and needs.  Wt Readings from Last 15 Encounters:  03/26/18 75.6 kg  03/08/18 84.9 kg  02/24/18 82.7 kg  02/19/18 82.7 kg  02/12/18 84 kg  02/10/18 84 kg  02/02/18 81.6 kg  01/18/18 79.4 kg  01/02/18 77.3 kg  12/31/17 77.4 kg  12/29/17 78 kg  12/21/17 77.1 kg  11/28/17 76 kg  11/18/17 76.9 kg  11/13/17 77 kg  Noted, 9.3 kg wt loss x 3 weeks, ?actual vs fluid Pt denies any recent wt loss.  Body mass index is 23.25 kg/m. Patient meets criteria for normal based on current BMI.   Current diet order is Carb Modified, patient meal consumption unknown at this time. Labs and medications reviewed.   Pt amenable to receiving Ensure Enlive daily in chocolate, however no additional nutrition interventions warranted at this time. If nutrition issues arise, please consult RD.   Althea Grimmer, MS, RDN, LDN Pager: [phone] Available Mondays and Fridays, 9am-2pm

## 2018-03-27 NOTE — Progress Notes (Signed)
Patient verbalized understanding of d/c instructions and medications. [facility] here for pick up and patient stable for transfer home.

## 2018-03-27 NOTE — Discharge Summary (Signed)
Discharge Summary  Taylor [name] QHU:[id number] DOB: Jan 27, 1955  PCP: Clent Demark, PA-C  Admit date: 03/26/2018 Discharge date: 03/27/2018   Time spent: < 25 minutes  Admitted From: home Disposition:  home  Recommendations for Outpatient Follow-up:  1. Follow up with PCP in 1 week   2. [facility] ensuring he had [facility]2 tank for home refills 3. New meds: prednisone burst x 4 days.      Discharge Diagnoses:  Active Hospital Problems   Diagnosis Date Noted  . Type 2 diabetes mellitus ([place]) 10/24/2017  . Chronic respiratory failure with hypoxia and hypercapnia ([place]) 09/14/2017  . COPD exacerbation ([place]) 01/09/2013  . Bipolar 1 disorder ([place]) 02/05/2012    Resolved Hospital Problems  No resolved problems to display.    Discharge Condition: STABLE   CODE STATUS:FULL    History of present illness:  Taylor [name] is a 63 y.o. year old male with medical history significant for COPD, bipolar disorder, depression, prostate cancer, diabetewho presented on 03/26/2018 with worsening shortness of breath, nonproductive cough after running out of his prescribed Dulera.  Has had similar admissions in the past month.  Admitted to [facility] for observation.  For acute on chronic hypoxic respiratory failure related to COPD flare. Remaining hospital course addressed in problem based format below:   Hospital Course:   COPD exacerbation, triggered by nonadherence Patient admits to running out of his [facility] prescription and not refilling for fear that he would not be able to pay prescription.  Discussed with case management and his [place] provider patient will pick up [facility].  Also ensured patient had adequate oxygen for discharge he was able to transition back to his home regimen of 3 L on discharge.  He briefly required bronchodilators in the ED to address significant wheezing/respiratory distress.  That improved once he arrived on the medical floor he was tolerating his  home oxygen regimen of 3 L with normal oxygen saturations and with reinitiation of home inhalers.  Chest x-ray was negative for pneumonia. short prednisone burst provided on discharge.  Leg pain Patient has no risk factors for DVT.  Venous duplex was obtained given associated shortness of breath of that was attributed to COPD.  Venous duplex was negative bilaterally for clots.   Consultations:  None  Procedures/Studies: Venous duplex, 3/5 Right: There is no evidence of deep vein thrombosis in the lower extremity. No cystic structure found in the popliteal fossa. Left: There is no evidence of deep vein thrombosis in the lower extremity. No cystic structure found in the popliteal fossa.  Discharge Exam: BP 100/85   Pulse 87   Temp 98.6 F (37 C) (Oral)   Resp 16   Ht 5\' 11"  (1.803 m)   Wt 75.6 kg   SpO2 97%   BMI 23.25 kg/m   General: Lying in bed, no apparent distress Eyes: EOMI, anicteric ENT: Oral Mucosa clear and moist Cardiovascular: regular rate and rhythm, no murmurs, rubs or gallops, no edema, Respiratory: Normal respiratory effort on 3 L, no wheezing, no crackles,  Abdomen: soft, non-distended, non-tender, normal bowel sounds Skin: No Rash Neurologic: Grossly no focal neuro deficit.Mental status AAOx3, speech normal, Psychiatric:Appropriate affect, and mood   Discharge Instructions You were cared for by a hospitalist during your hospital stay. If you have any questions about your discharge medications or the care you received while you were in the hospital after you are discharged, you can call the unit and asked to speak with the  hospitalist on call if the hospitalist that took care of you is not available. Once you are discharged, your primary care physician will handle any further medical issues. Please note that NO REFILLS for any discharge medications will be authorized once you are discharged, as it is imperative that you return to your primary care physician (or  establish a relationship with a primary care physician if you do not have one) for your aftercare needs so that they can reassess your need for medications and monitor your lab values.  Discharge Instructions    Diet - low sodium heart healthy   Complete by:  As directed    Increase activity slowly   Complete by:  As directed      Allergies as of 03/27/2018      Reactions   Codeine Nausea And Vomiting      Medication List    TAKE these medications   Accu-Chek Aviva device Use as instructed   accu-chek soft touch lancets 1 each by Other route 2 (two) times daily. Use as instructed   albuterol (2.5 MG/3ML) 0.083% nebulizer solution Commonly known as:  PROVENTIL Take 3 mLs (2.5 mg total) by nebulization every 4 (four) hours as needed for wheezing or shortness of breath (if you can't catch your breath).   ARIPiprazole 5 MG tablet Commonly known as:  ABILIFY Take 5 mg by mouth daily.   benzonatate 100 MG capsule Commonly known as:  TESSALON Take 1 capsule (100 mg total) by mouth every 8 (eight) hours. What changed:    when to take this  reasons to take this   escitalopram 20 MG tablet Commonly known as:  LEXAPRO Take 20 mg by mouth daily.   Fluticasone-Umeclidin-Vilant 100-62.5-25 MCG/INH Aepb Commonly known as:  Trelegy Ellipta Inhale 1 puff into the lungs daily.   folic acid 1 MG tablet Commonly known as:  FOLVITE Take 1 tablet (1 mg total) by mouth 3 (three) times a week.   glucose blood test strip Commonly known as:  Accu-Chek Aviva 1 each by Other route as needed for other. Use as instructed   lovastatin 20 MG tablet Commonly known as:  MEVACOR Take 1 tablet (20 mg total) by mouth at bedtime.   metFORMIN 1000 MG tablet Commonly known as:  GLUCOPHAGE Take 1 tablet (1,000 mg total) by mouth 2 (two) times daily with a meal.   mometasone-formoterol 100-5 MCG/ACT Aero Commonly known as:  DULERA Inhale 2 puffs into the lungs 2 (two) times daily.    OXYGEN Inhale 3 L into the lungs continuous.   pantoprazole 40 MG tablet Commonly known as:  PROTONIX Take 1 tablet (40 mg total) by mouth daily.   polyethylene glycol packet Commonly known as:  MIRALAX / GLYCOLAX Take 17 g by mouth daily. What changed:    when to take this  reasons to take this   predniSONE 20 MG tablet Commonly known as:  DELTASONE Take 2 tablets (40 mg total) by mouth daily with breakfast for 4 days. Start taking on:  March 28, 2018   senna-docusate 8.6-50 MG tablet Commonly known as:  Senokot-S Take 1 tablet by mouth 2 (two) times daily. What changed:    when to take this  reasons to take this   tamsulosin 0.4 MG Caps capsule Commonly known as:  FLOMAX Take 1 capsule (0.4 mg total) by mouth daily.   tiotropium 18 MCG inhalation capsule Commonly known as:  SPIRIVA Place 18 mcg into inhaler and inhale daily.  Allergies  Allergen Reactions  . Codeine Nausea And Vomiting      The results of significant diagnostics from this hospitalization (including imaging, microbiology, ancillary and laboratory) are listed below for reference.    Significant Diagnostic Studies: Dg Chest 2 View  Result Date: 03/26/2018 CLINICAL DATA:  Sob /weaknessLegs cramping And shakingX smoker 3 yearsSymptoms 4 days and getting worseTaking radiation treatments for prostrate cancerHx of colon cancerUses O2 at home EXAM: CHEST - 2 VIEW COMPARISON:  03/07/2018 and prior exams. FINDINGS: Normal heart, mediastinum and hila. Lungs are hyperexpanded with relative lucency in the upper lobes consistent with emphysema. Lungs are clear. No pleural effusion or pneumothorax. Skeletal structures are demineralized. There old healed right rib fractures. IMPRESSION: 1. No acute cardiopulmonary disease. 2. COPD/emphysema. Electronically Signed   By: Lajean Manes M.D.   On: 03/26/2018 15:27   Dg Chest Portable 1 View  Result Date: 03/07/2018 CLINICAL DATA:  Shortness of breath 2 days  worse today. Abdominal distension with pain and diarrhea. EXAM: PORTABLE CHEST 1 VIEW COMPARISON:  02/12/2018 FINDINGS: Lungs are somewhat hyperexpanded with increased lucency over the mid to upper lungs compatible with emphysematous disease. No focal airspace consolidation or effusion. Cardiomediastinal silhouette and remainder of the exam is unchanged. IMPRESSION: No acute findings. Emphysematous disease. Electronically Signed   By: Marin Olp M.D.   On: 03/07/2018 23:00   Dg Abd 2 Views  Result Date: 03/08/2018 CLINICAL DATA:  Abdominal distension and diarrhea. EXAM: ABDOMEN - 2 VIEW COMPARISON:  None. FINDINGS: The bowel gas pattern is normal. There is no evidence of free air. No radio-opaque calculi or other significant radiographic abnormality is seen. IMPRESSION: No bowel obstruction. Electronically Signed   By: Abelardo Diesel M.D.   On: 03/08/2018 03:42   Vas [place] Lower Extremity Venous (dvt) (only [facility] & [facility])  Result Date: 03/26/2018  Lower Venous Study Indications: Edema.  Performing Technologist: Abram Sander RVS  Examination Guidelines: A complete evaluation includes B-mode imaging, spectral Doppler, color Doppler, and power Doppler as needed of all accessible portions of each vessel. Bilateral testing is considered an integral part of a complete examination. Limited examinations for reoccurring indications may be performed as noted.  Right Venous Findings: +---------+---------------+---------+-----------+----------+--------------+          CompressibilityPhasicitySpontaneityPropertiesSummary        +---------+---------------+---------+-----------+----------+--------------+ CFV      Full           Yes      Yes                                 +---------+---------------+---------+-----------+----------+--------------+ SFJ      Full                                                        +---------+---------------+---------+-----------+----------+--------------+ FV Prox  Full                                                         +---------+---------------+---------+-----------+----------+--------------+ FV Mid   Full                                                        +---------+---------------+---------+-----------+----------+--------------+  FV DistalFull                                                        +---------+---------------+---------+-----------+----------+--------------+ PFV      Full                                                        +---------+---------------+---------+-----------+----------+--------------+ POP      Full           Yes      Yes                                 +---------+---------------+---------+-----------+----------+--------------+ PTV      Full                                                        +---------+---------------+---------+-----------+----------+--------------+ PERO                                                  Not visualized +---------+---------------+---------+-----------+----------+--------------+  Left Venous Findings: +---------+---------------+---------+-----------+----------+--------------+          CompressibilityPhasicitySpontaneityPropertiesSummary        +---------+---------------+---------+-----------+----------+--------------+ CFV      Full           Yes      Yes                                 +---------+---------------+---------+-----------+----------+--------------+ SFJ      Full                                                        +---------+---------------+---------+-----------+----------+--------------+ FV Prox  Full                                                        +---------+---------------+---------+-----------+----------+--------------+ FV Mid   Full                                                        +---------+---------------+---------+-----------+----------+--------------+ FV DistalFull                                                         +---------+---------------+---------+-----------+----------+--------------+  PFV      Full                                                        +---------+---------------+---------+-----------+----------+--------------+ POP      Full           Yes      Yes                                 +---------+---------------+---------+-----------+----------+--------------+ PTV      Full                                                        +---------+---------------+---------+-----------+----------+--------------+ PERO                                                  Not visualized +---------+---------------+---------+-----------+----------+--------------+    Summary: Right: There is no evidence of deep vein thrombosis in the lower extremity. No cystic structure found in the popliteal fossa. Left: There is no evidence of deep vein thrombosis in the lower extremity. No cystic structure found in the popliteal fossa.  *See table(s) above for measurements and observations. Electronically signed by Servando Snare MD on 03/26/2018 at 5:51:16 PM.    Final     Microbiology: No results found for this or any previous visit (from the past 240 hour(s)).   Labs: Basic Metabolic Panel: Recent Labs  Lab 03/26/18 1425 03/27/18 0236  NA 138 135  K 4.0 4.4  CL 95* 97*  CO2 32 31  GLUCOSE 122* 224*  BUN 8 12  CREATININE 0.76 0.89  CALCIUM 8.9 8.4*  MG 1.7 1.8   Liver Function Tests: Recent Labs  Lab 03/26/18 1425  AST 22  ALT 21  ALKPHOS 42  BILITOT 0.9  PROT 6.1*  ALBUMIN 3.9   No results for input(s): LIPASE, AMYLASE in the last 168 hours. No results for input(s): AMMONIA in the last 168 hours. CBC: Recent Labs  Lab 03/26/18 1425 03/27/18 0236  WBC 5.0 4.8  NEUTROABS 3.2  --   HGB 11.5* 10.8*  HCT 36.3* 32.9*  MCV 93.1 90.6  PLT 170 155   Cardiac Enzymes: No results for input(s): CKTOTAL, CKMB, CKMBINDEX, TROPONINI in the last 168  hours. BNP: BNP (last 3 results) Recent Labs    12/21/17 1551 01/17/18 1454 03/26/18 1425  BNP 27.5 26.3 24.2    ProBNP (last 3 results) No results for input(s): PROBNP in the last 8760 hours.  CBG: Recent Labs  Lab 03/26/18 2031 03/27/18 0758 03/27/18 1120  GLUCAP 155* 93 191*       Signed:  Desiree Hane, MD [facility] 03/27/2018, 4:40 PM

## 2018-03-27 NOTE — Care Management Note (Addendum)
Case Management Note  Patient Details  Name: Taylor [name] MRN: [id number] Date of Birth: [birth year]  Subjective/Objective:  From home at friends house [place], [place] [address],  Phone [phone](cell).  Patient for dc today, he states he has [employer] and that he has Dulera to go home with.  He states he needs more oxygen tanks, he has a concentrator at the house, but only has one tank and he needs more.  This NCM called [facility] who he has oxygen with, they informed this NCM to call Caryl Pina with the [place] at [phone] ext [phone] to ask for emergency order for oxygen because he is not due to have tanks refilled yet.  This NCM called Caryl Pina and left her a message that patient need refilled tanks, awaiting call back.  No call back yet, this NCM called CSW at [facility] [name], she will try to contact [name] and call me back.  Patient states he needs ambulance transport home.   Ms Laugfton called this NCM back and stated Caryl Pina is working on this . NCM received call, they will contact patient and will go out to refill his tanks at the house. NCM put ambulance forms on chart.  PTAR states they have eight pickups in front of him.                  Action/Plan: DC home when ready.   Expected Discharge Date:                  Expected Discharge Plan:  Home/Self Care  In-House Referral:     Discharge planning Services  CM Consult  Post Acute Care Choice:    Choice offered to:     DME Arranged:    DME Agency:     HH Arranged:    [place] Agency:     Status of Service:  Completed, signed off  If discussed at [employer] of Stay Meetings, dates discussed:    Additional Comments:  [name], RN 03/27/2018, 2:33 PM

## 2018-03-27 NOTE — Evaluation (Signed)
Physical Therapy Evaluation Patient Details Name: Taylor [name] MRN: [id number] DOB: [birth year] Today's Date: 03/27/2018   History of Present Illness  Pt is a 63 yo male s/p COPD exacerbation with complaints of SOB after running out of one of his meds. Pt with 8 hospitalizations in 6 months for COPD. PMHx: COPD, emphysema on 3L continuous,  bipolar d/o, depression, prostate CA, colon sx.     Clinical Impression  Patient evaluated by Physical Therapy with no further acute PT needs identified. All education has been completed and the patient has no further questions. PTA patient is living at friends with increasing DOE with light activity. Today ambulating without assistance, encouraged to ambulate 2-3 times a day with staff.  See below for any follow-up Physical Therapy or equipment needs. PT is signing off. Thank you for this referral.     Follow Up Recommendations No PT follow up    Equipment Recommendations  None recommended by PT    Recommendations for Other Services       Precautions / Restrictions Precautions Precautions: None Precaution Comments: 3L O2 continuous currently and at baseline Restrictions Weight Bearing Restrictions: No      Mobility  Bed Mobility Overal bed mobility: Independent                Transfers Overall transfer level: Independent Equipment used: None                Ambulation/Gait Ambulation/Gait assistance: Independent Gait Distance (Feet): 45 Feet Assistive device: None Gait Pattern/deviations: WFL(Within Functional Limits) Gait velocity: decreased   General Gait Details: ambulating short distances without SOB or LOB, no AD, on 4L this visit.   Stairs            Wheelchair Mobility    Modified Rankin (Stroke Patients Only)       Balance Overall balance assessment: No apparent balance deficits (not formally assessed)                                           Pertinent Vitals/Pain  Pain Assessment: No/denies pain    Home Living Family/patient expects to be discharged to:: Private residence Living Arrangements: Non-relatives/Friends Available Help at Discharge: Friend(s) Type of Home: House Home Access: Stairs to enter   Technical brewer of Steps: 3 Home Layout: One level Home Equipment: Shower seat - built in Additional Comments:  only Dealer    Prior Function Level of Independence: Independent         Comments: Limited ambulator d/t DOE. Reports he has transportation to doctor appointments through [facility]. Friend does grocery shopping, cooks his own meals; uses scooter at [place]: Right    Extremity/Trunk Assessment   Upper Extremity Assessment Upper Extremity Assessment: Overall WFL for tasks assessed    Lower Extremity Assessment Lower Extremity Assessment: Overall WFL for tasks assessed    Cervical / Trunk Assessment Cervical / Trunk Assessment: Normal  Communication   Communication: No difficulties  Cognition Arousal/Alertness: Awake/alert Behavior During Therapy: WFL for tasks assessed/performed Overall Cognitive Status: Within Functional Limits for tasks assessed                                        General Comments  Exercises     Assessment/Plan    PT Assessment    PT Problem List         PT Treatment Interventions      PT Goals (Current goals can be found in the Care Plan section)  Acute Rehab PT Goals Patient Stated Goal: to stop receiving VA benefits Potential to Achieve Goals: Good    Frequency Min 3X/week   Barriers to discharge        Co-evaluation               AM-PAC PT "6 Clicks" Mobility  Outcome Measure Help needed turning from your back to your side while in a flat bed without using bedrails?: None Help needed moving from lying on your back to sitting on the side of a flat bed without using bedrails?: None Help needed  moving to and from a bed to a chair (including a wheelchair)?: None Help needed standing up from a chair using your arms (e.g., wheelchair or bedside chair)?: A Little Help needed to walk in hospital room?: A Little Help needed climbing 3-5 steps with a railing? : A Little 6 Click Score: 21    End of Session Equipment Utilized During Treatment: Gait belt;Oxygen Activity Tolerance: Patient tolerated treatment well Patient left: in bed;with call bell/phone within reach Nurse Communication: Mobility status PT Visit Diagnosis: Muscle weakness (generalized) (M62.81);Difficulty in walking, not elsewhere classified (R26.2)    Time: 6237-6283 PT Time Calculation (min) (ACUTE ONLY): 20 min   Charges:   PT Evaluation $PT Eval Low Complexity: 1 Low         Reinaldo Berber, PT, DPT Acute Rehabilitation Services Pager: [phone] Office: [phone]    Reinaldo Berber 03/27/2018, 2:23 PM

## 2018-03-27 NOTE — Evaluation (Signed)
Occupational Therapy Evaluation and Discharge Patient Details Name: NEITHAN DAY MRN: [id number] DOB: [birth year] Today's Date: 03/27/2018    History of Present Illness Pt is a 63 yo male s/p COPD exacerbation with complaints of SOB after running out of one of his meds. Pt with 8 hospitalizations in 6 months for COPD. PMHx: COPD, emphysema on 3L continuous,  bipolar d/o, depression, prostate CA, colon sx.   Clinical Impression   Pt is functioning modified independently in ADL. No OT needs.    Follow Up Recommendations  No OT follow up    Equipment Recommendations  None recommended by OT    Recommendations for Other Services       Precautions / Restrictions Precautions Precautions: None Precaution Comments: 3L O2 continuous currently and at baseline Restrictions Weight Bearing Restrictions: No      Mobility Bed Mobility Overal bed mobility: Independent                Transfers Overall transfer level: Independent Equipment used: None                  Balance Overall balance assessment: No apparent balance deficits (not formally assessed)                                         ADL either performed or assessed with clinical judgement   ADL Overall ADL's : Modified independent                                       General ADL Comments: Pt is functioning at a modified independent level in ADL. No OT needs.     Vision Baseline Vision/History: Wears glasses Wears Glasses: At all times Patient Visual Report: No change from baseline       Perception     Praxis      Pertinent Vitals/Pain Pain Assessment: No/denies pain     Hand Dominance Right   Extremity/Trunk Assessment Upper Extremity Assessment Upper Extremity Assessment: Overall WFL for tasks assessed(baseline tremor)   Lower Extremity Assessment Lower Extremity Assessment: Defer to PT evaluation   Cervical / Trunk Assessment Cervical / Trunk  Assessment: Normal   Communication Communication Communication: No difficulties   Cognition Arousal/Alertness: Awake/alert Behavior During Therapy: WFL for tasks assessed/performed Overall Cognitive Status: Within Functional Limits for tasks assessed                                     General Comments       Exercises     Shoulder Instructions      Home Living Family/patient expects to be discharged to:: Private residence Living Arrangements: Non-relatives/Friends Available Help at Discharge: Friend(s) Type of Home: House Home Access: Stairs to enter Technical brewer of Steps: 3   Home Layout: One level     Bathroom Shower/Tub: Teacher, early years/pre: Standard     Home Equipment: Civil engineer, contracting - built in   Additional Comments:  only Dealer      Prior Functioning/Environment Level of Independence: Independent        Comments: Limited ambulator d/t DOE. Reports he has transportation to doctor appointments through [facility]. Friend does grocery shopping, cooks his own meals; uses scooter at [employer]        OT Problem List:        OT Treatment/Interventions:      OT Goals(Current goals can be found in the care plan section) Acute Rehab OT Goals Patient Stated Goal: to stop receiving VA benefits  OT Frequency:     Barriers to D/C:            Co-evaluation              AM-PAC OT "6 Clicks" Daily Activity     Outcome Measure Help from another person eating meals?: None Help from another person taking care of personal grooming?: None Help from another person toileting, which includes using toliet, bedpan, or urinal?: None Help from another person bathing (including washing, rinsing, drying)?: None Help from another person to put on and taking off regular upper body clothing?: None   6 Click Score: 20   End of Session Equipment Utilized During Treatment: Oxygen(3L)  Activity Tolerance: Patient tolerated  treatment well Patient left: in bed;with call bell/phone within reach  OT Visit Diagnosis: Other (comment)(decreased activity tolerance)                Time: 3762-8315 OT Time Calculation (min): 15 min Charges:  OT General Charges $OT Visit: 1 Visit OT Evaluation $OT Eval Low Complexity: 1 Low  Nestor Lewandowsky, OTR/L Acute Rehabilitation Services Pager: [phone] Office: [phone]  Malka So 03/27/2018, 11:40 AM

## 2018-03-28 LAB — GLUCOSE, CAPILLARY
GLUCOSE-CAPILLARY: 101 mg/dL — AB (ref 70–99)
Glucose-Capillary: 158 mg/dL — ABNORMAL HIGH (ref 70–99)

## 2018-03-30 ENCOUNTER — Ambulatory Visit: Payer: Self-pay | Admitting: Internal Medicine

## 2018-03-30 ENCOUNTER — Ambulatory Visit: Payer: Medicaid Other

## 2018-03-31 ENCOUNTER — Ambulatory Visit: Payer: Medicaid Other

## 2018-04-01 ENCOUNTER — Ambulatory Visit: Payer: Medicaid Other

## 2018-04-02 ENCOUNTER — Ambulatory Visit: Payer: Medicaid Other

## 2018-04-03 ENCOUNTER — Ambulatory Visit: Payer: Medicaid Other

## 2018-04-03 ENCOUNTER — Encounter (HOSPITAL_COMMUNITY): Payer: Self-pay | Admitting: Emergency Medicine

## 2018-04-03 ENCOUNTER — Emergency Department (HOSPITAL_COMMUNITY): Payer: Medicaid Other

## 2018-04-03 ENCOUNTER — Other Ambulatory Visit: Payer: Self-pay

## 2018-04-03 ENCOUNTER — Emergency Department (HOSPITAL_COMMUNITY)
Admission: EM | Admit: 2018-04-03 | Discharge: 2018-04-04 | Disposition: A | Discharge status: Home / Self Care | Payer: Medicaid Other | Attending: Emergency Medicine | Admitting: Emergency Medicine

## 2018-04-03 DIAGNOSIS — Z87891 Personal history of nicotine dependence: Secondary | ICD-10-CM | POA: Insufficient documentation

## 2018-04-03 DIAGNOSIS — Z7984 Long term (current) use of oral hypoglycemic drugs: Secondary | ICD-10-CM | POA: Diagnosis not present

## 2018-04-03 DIAGNOSIS — Z79899 Other long term (current) drug therapy: Secondary | ICD-10-CM | POA: Insufficient documentation

## 2018-04-03 DIAGNOSIS — F419 Anxiety disorder, unspecified: Secondary | ICD-10-CM | POA: Diagnosis not present

## 2018-04-03 DIAGNOSIS — R0602 Shortness of breath: Secondary | ICD-10-CM | POA: Insufficient documentation

## 2018-04-03 DIAGNOSIS — Z8546 Personal history of malignant neoplasm of prostate: Secondary | ICD-10-CM | POA: Diagnosis not present

## 2018-04-03 DIAGNOSIS — E785 Hyperlipidemia, unspecified: Secondary | ICD-10-CM | POA: Insufficient documentation

## 2018-04-03 DIAGNOSIS — J449 Chronic obstructive pulmonary disease, unspecified: Secondary | ICD-10-CM | POA: Insufficient documentation

## 2018-04-03 DIAGNOSIS — E119 Type 2 diabetes mellitus without complications: Secondary | ICD-10-CM | POA: Diagnosis not present

## 2018-04-03 LAB — CBC WITH DIFFERENTIAL/PLATELET
ABS IMMATURE GRANULOCYTES: 0.11 10*3/uL — AB (ref 0.00–0.07)
BASOS PCT: 0 %
Basophils Absolute: 0 10*3/uL (ref 0.0–0.1)
Eosinophils Absolute: 0 10*3/uL (ref 0.0–0.5)
Eosinophils Relative: 0 %
HCT: 36.2 % — ABNORMAL LOW (ref 39.0–52.0)
Hemoglobin: 11.4 g/dL — ABNORMAL LOW (ref 13.0–17.0)
IMMATURE GRANULOCYTES: 3 %
Lymphocytes Relative: 19 %
Lymphs Abs: 0.9 10*3/uL (ref 0.7–4.0)
MCH: 29.5 pg (ref 26.0–34.0)
MCHC: 31.5 g/dL (ref 30.0–36.0)
MCV: 93.5 fL (ref 80.0–100.0)
Monocytes Absolute: 0.5 10*3/uL (ref 0.1–1.0)
Monocytes Relative: 11 %
NEUTROS PCT: 67 %
Neutro Abs: 3 10*3/uL (ref 1.7–7.7)
Platelets: 201 10*3/uL (ref 150–400)
RBC: 3.87 MIL/uL — ABNORMAL LOW (ref 4.22–5.81)
RDW: 13.6 % (ref 11.5–15.5)
WBC: 4.5 10*3/uL (ref 4.0–10.5)
nRBC: 0 % (ref 0.0–0.2)

## 2018-04-03 LAB — COMPREHENSIVE METABOLIC PANEL
ALBUMIN: 4 g/dL (ref 3.5–5.0)
ALT: 20 U/L (ref 0–44)
AST: 21 U/L (ref 15–41)
Alkaline Phosphatase: 45 U/L (ref 38–126)
Anion gap: 10 (ref 5–15)
BUN: 8 mg/dL (ref 8–23)
CALCIUM: 8.7 mg/dL — AB (ref 8.9–10.3)
CO2: 29 mmol/L (ref 22–32)
Chloride: 99 mmol/L (ref 98–111)
Creatinine, Ser: 0.72 mg/dL (ref 0.61–1.24)
GFR calc Af Amer: >60 mL/min (ref >60)
GFR calc non Af Amer: >60 mL/min (ref >60)
Glucose, Bld: 80 mg/dL (ref 70–99)
Potassium: 4.2 mmol/L (ref 3.5–5.1)
Sodium: 138 mmol/L (ref 135–145)
Total Bilirubin: 0.5 mg/dL (ref 0.3–1.2)
Total Protein: 6.1 g/dL — ABNORMAL LOW (ref 6.5–8.1)

## 2018-04-03 LAB — TROPONIN I: Troponin I: <0.03 ng/mL (ref <0.03)

## 2018-04-03 LAB — I-STAT TROPONIN, ED: Troponin i, poc: 0 ng/mL (ref 0.00–0.08)

## 2018-04-03 MED ORDER — LORAZEPAM 2 MG/ML IJ SOLN
1.0000 mg | Freq: Once | INTRAMUSCULAR | Status: AC
  Administered 2018-04-03: 1 mg via INTRAVENOUS
  Filled 2018-04-03: qty 1

## 2018-04-03 MED ORDER — LORAZEPAM 1 MG PO TABS
1.0000 mg | ORAL_TABLET | Freq: Once | ORAL | Status: AC
  Administered 2018-04-03: 1 mg via ORAL
  Filled 2018-04-03: qty 1

## 2018-04-03 NOTE — ED Triage Notes (Signed)
Pt arrives by [facility] with complaints of SOB that started today. EMS gave 4mg  albuterol, .05 Atrovent> Pt endorses chest tightness that improved after 1 nitro and 324 aspirin.
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
## 2018-04-03 NOTE — ED Notes (Signed)
Patient given sandwich and drink.  

## 2018-04-03 NOTE — ED Notes (Signed)
Pt returned from xray

## 2018-04-04 NOTE — ED Notes (Signed)
Pt first stating he was never brought any oxygen to him home and he has none. Pt then states he has one tank at home. Pt states he can not walk but then states he can only walk short distances d/t oxygen demand.  Attempts made to call pts friend Elta Guadeloupe [phone], message left

## 2018-04-04 NOTE — ED Notes (Signed)
PTAR at bedside 

## 2018-04-04 NOTE — ED Notes (Signed)
PTAR returned to take patient to home.

## 2018-04-06 ENCOUNTER — Telehealth: Payer: Self-pay | Admitting: Radiation Oncology

## 2018-04-06 ENCOUNTER — Ambulatory Visit: Admission: RE | Admit: 2018-04-06 | Payer: Medicaid Other | Source: Ambulatory Visit

## 2018-04-06 ENCOUNTER — Telehealth: Payer: Self-pay

## 2018-04-06 MED ORDER — TIOTROPIUM BROMIDE MONOHYDRATE 18 MCG IN CAPS: 18.0000 ug | ORAL_CAPSULE | Freq: Every day | RESPIRATORY_TRACT | 5 refills | Status: DC

## 2018-04-06 MED ORDER — ALBUTEROL SULFATE (2.5 MG/3ML) 0.083% IN NEBU: 2.5000 mg | INHALATION_SOLUTION | RESPIRATORY_TRACT | 4 refills | Status: DC | PRN

## 2018-04-06 MED ORDER — MOMETASONE FURO-FORMOTEROL FUM 100-5 MCG/ACT IN AERO: 2.0000 | INHALATION_SPRAY | Freq: Two times a day (BID) | RESPIRATORY_TRACT | 5 refills | Status: DC

## 2018-04-06 NOTE — Telephone Encounter (Signed)
Called office stating he thinks he needs a prednisone taper, he states he was recently seen in ED and he was given a prednisone taper and he states it made him feel better. He states it has helped his breathing significantly. He denies all symptoms but states he thinks he needs another short term taper to keep him out of the clear. Requesting refills of Albuterol nebulizer solution, Dulera, and spiriva. Refills sent.   Call back # [phone]

## 2018-04-06 NOTE — Telephone Encounter (Signed)
ATC patient. VM is not setup. Will call back later.

## 2018-04-06 NOTE — Telephone Encounter (Signed)
Noted in EMR that patient is staying with Elta Guadeloupe. Phoned Elta Guadeloupe to inquire about patient status and intent to resume radiation. Elta Guadeloupe unsure but committed to speaking with patient later today and encouraging him to call this RN. Provided Elta Guadeloupe with my contact information. Mark read back my number correctly. Informed Faith, RT on L1 of this finding.

## 2018-04-06 NOTE — Telephone Encounter (Signed)
Patient phoned back. Patient cancelled treatment for today but expressed his intent to return tomorrow. Informed L1 of this finding.

## 2018-04-07 ENCOUNTER — Ambulatory Visit: Payer: Medicaid Other

## 2018-04-08 ENCOUNTER — Ambulatory Visit
Admission: RE | Admit: 2018-04-08 | Discharge: 2018-04-08 | Disposition: A | Discharge status: Home / Self Care | Payer: Medicaid Other | Source: Ambulatory Visit | Attending: Radiation Oncology | Admitting: Radiation Oncology

## 2018-04-08 DIAGNOSIS — Z510 Encounter for antineoplastic radiation therapy: Secondary | ICD-10-CM | POA: Diagnosis not present

## 2018-04-09 ENCOUNTER — Ambulatory Visit
Admission: RE | Admit: 2018-04-09 | Discharge: 2018-04-09 | Disposition: A | Discharge status: Home / Self Care | Payer: Medicaid Other | Source: Ambulatory Visit | Attending: Radiation Oncology | Admitting: Radiation Oncology

## 2018-04-09 ENCOUNTER — Other Ambulatory Visit: Payer: Self-pay

## 2018-04-09 DIAGNOSIS — Z510 Encounter for antineoplastic radiation therapy: Secondary | ICD-10-CM | POA: Diagnosis not present

## 2018-04-09 NOTE — Telephone Encounter (Signed)
ATC Patient.  LMTCB. 

## 2018-04-10 ENCOUNTER — Other Ambulatory Visit: Payer: Self-pay

## 2018-04-10 ENCOUNTER — Emergency Department (HOSPITAL_COMMUNITY)
Admission: EM | Admit: 2018-04-10 | Discharge: 2018-04-10 | Disposition: A | Discharge status: Home / Self Care | Payer: Medicaid Other | Attending: Emergency Medicine | Admitting: Emergency Medicine

## 2018-04-10 ENCOUNTER — Encounter (HOSPITAL_COMMUNITY): Payer: Self-pay

## 2018-04-10 ENCOUNTER — Ambulatory Visit: Payer: Medicaid Other

## 2018-04-10 DIAGNOSIS — J45909 Unspecified asthma, uncomplicated: Secondary | ICD-10-CM | POA: Diagnosis not present

## 2018-04-10 DIAGNOSIS — Z7984 Long term (current) use of oral hypoglycemic drugs: Secondary | ICD-10-CM | POA: Insufficient documentation

## 2018-04-10 DIAGNOSIS — Z87891 Personal history of nicotine dependence: Secondary | ICD-10-CM | POA: Insufficient documentation

## 2018-04-10 DIAGNOSIS — E119 Type 2 diabetes mellitus without complications: Secondary | ICD-10-CM | POA: Diagnosis not present

## 2018-04-10 DIAGNOSIS — R197 Diarrhea, unspecified: Secondary | ICD-10-CM | POA: Diagnosis present

## 2018-04-10 DIAGNOSIS — A09 Infectious gastroenteritis and colitis, unspecified: Secondary | ICD-10-CM | POA: Diagnosis not present

## 2018-04-10 DIAGNOSIS — E860 Dehydration: Secondary | ICD-10-CM | POA: Diagnosis not present

## 2018-04-10 DIAGNOSIS — Z79899 Other long term (current) drug therapy: Secondary | ICD-10-CM | POA: Diagnosis not present

## 2018-04-10 LAB — BASIC METABOLIC PANEL
ANION GAP: 9 (ref 5–15)
BUN: 10 mg/dL (ref 8–23)
CO2: 31 mmol/L (ref 22–32)
Calcium: 8.9 mg/dL (ref 8.9–10.3)
Chloride: 98 mmol/L (ref 98–111)
Creatinine, Ser: 0.7 mg/dL (ref 0.61–1.24)
GFR calc Af Amer: >60 mL/min (ref >60)
GFR calc non Af Amer: >60 mL/min (ref >60)
GLUCOSE: 98 mg/dL (ref 70–99)
Potassium: 4 mmol/L (ref 3.5–5.1)
Sodium: 138 mmol/L (ref 135–145)

## 2018-04-10 LAB — CBC
HCT: 35.4 % — ABNORMAL LOW (ref 39.0–52.0)
Hemoglobin: 11.5 g/dL — ABNORMAL LOW (ref 13.0–17.0)
MCH: 31.2 pg (ref 26.0–34.0)
MCHC: 32.5 g/dL (ref 30.0–36.0)
MCV: 95.9 fL (ref 80.0–100.0)
PLATELETS: 179 10*3/uL (ref 150–400)
RBC: 3.69 MIL/uL — AB (ref 4.22–5.81)
RDW: 14 % (ref 11.5–15.5)
WBC: 5.4 10*3/uL (ref 4.0–10.5)
nRBC: 0 % (ref 0.0–0.2)

## 2018-04-10 LAB — CBG MONITORING, ED: Glucose-Capillary: 93 mg/dL (ref 70–99)

## 2018-04-10 MED ORDER — LOPERAMIDE HCL 2 MG PO CAPS: ORAL_CAPSULE | ORAL | 0 refills | Status: DC

## 2018-04-10 MED ORDER — ALBUTEROL SULFATE (2.5 MG/3ML) 0.083% IN NEBU
5.0000 mg | INHALATION_SOLUTION | Freq: Once | RESPIRATORY_TRACT | Status: AC
  Administered 2018-04-10: 5 mg via RESPIRATORY_TRACT
  Filled 2018-04-10: qty 6

## 2018-04-10 MED ORDER — SODIUM CHLORIDE 0.9 % IV BOLUS
1000.0000 mL | Freq: Once | INTRAVENOUS | Status: AC
  Administered 2018-04-10: 1000 mL via INTRAVENOUS

## 2018-04-10 MED ORDER — LOPERAMIDE HCL 2 MG PO CAPS
4.0000 mg | ORAL_CAPSULE | Freq: Once | ORAL | Status: AC
  Administered 2018-04-10: 4 mg via ORAL
  Filled 2018-04-10: qty 2

## 2018-04-10 NOTE — ED Triage Notes (Signed)
Pt arrived via EMS from home. Pt states that he was sitting in home and felt as if he was going to pass out. Pt also reports Nausea and diarrhea x 1 days and increased SOB. HX of COPD chronically on 3 lpm of O2 therapy,  Prostate Cancer ( currently getting Radiation therapy ).   Given 500cc of NS  Enroute, d/t feeling dizziness when standing.    IV 18G left Wrist EMS v/s 127/78, CBG 167, 97% 3 lpm, RR 20

## 2018-04-10 NOTE — ED Notes (Signed)
PTAR has been dispatched.  
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
## 2018-04-10 NOTE — ED Notes (Signed)
Patient given coke and sandwich.

## 2018-04-10 NOTE — Telephone Encounter (Signed)
Attempted to call pt x2 but each time I dialed the number, it only rang twice and immediately got a busy signal. Will try to call back later.

## 2018-04-10 NOTE — ED Notes (Signed)
Bed: VO16 Expected date:  Expected time:  Means of arrival:  Comments: EMS-near syncope

## 2018-04-10 NOTE — Discharge Instructions (Signed)
Drink plenty of fluids.   Take Imodium for diarrhea and follow-up with your doctor as planned

## 2018-04-13 ENCOUNTER — Ambulatory Visit: Payer: Medicaid Other

## 2018-04-13 ENCOUNTER — Ambulatory Visit
Admission: RE | Admit: 2018-04-13 | Discharge: 2018-04-13 | Disposition: A | Discharge status: Home / Self Care | Payer: Medicaid Other | Source: Ambulatory Visit | Attending: Radiation Oncology | Admitting: Radiation Oncology

## 2018-04-13 ENCOUNTER — Other Ambulatory Visit: Payer: Self-pay

## 2018-04-13 DIAGNOSIS — Z510 Encounter for antineoplastic radiation therapy: Secondary | ICD-10-CM | POA: Diagnosis not present

## 2018-04-13 NOTE — Telephone Encounter (Signed)
Attempted to call pt x2 but each time I dialed the number, it only rang twice and immediately got a busy signal. I have left a voicemail message for pt to return call. X4 We have left three messages for pt to call our office back regarding results. No response at this time, a letter has been placed in mail today for pt to call our office. Per triage protocol tried several times, no response, closing message When patient calls back another message for that call can be opened. Mailed letter of communication today. Nothing further needed at this time.

## 2018-04-14 ENCOUNTER — Inpatient Hospital Stay: Payer: Medicaid Other | Admitting: Medical

## 2018-04-14 ENCOUNTER — Encounter (HOSPITAL_COMMUNITY): Payer: Self-pay | Admitting: Emergency Medicine

## 2018-04-14 ENCOUNTER — Other Ambulatory Visit: Payer: Self-pay

## 2018-04-14 ENCOUNTER — Inpatient Hospital Stay (HOSPITAL_COMMUNITY)
Admission: EM | Admit: 2018-04-14 | Discharge: 2018-04-16 | DRG: 190 | Disposition: A | Discharge status: Home / Self Care | Payer: No Typology Code available for payment source | Attending: Internal Medicine | Admitting: Internal Medicine

## 2018-04-14 ENCOUNTER — Ambulatory Visit
Admission: RE | Admit: 2018-04-14 | Discharge: 2018-04-14 | Disposition: A | Discharge status: Home / Self Care | Payer: Medicaid Other | Source: Ambulatory Visit | Attending: Radiation Oncology | Admitting: Radiation Oncology

## 2018-04-14 ENCOUNTER — Emergency Department (HOSPITAL_COMMUNITY): Payer: No Typology Code available for payment source

## 2018-04-14 VITALS — BP 160/67 | HR 107 | Temp 98.6°F | Resp 26

## 2018-04-14 DIAGNOSIS — E099 Drug or chemical induced diabetes mellitus without complications: Secondary | ICD-10-CM | POA: Diagnosis present

## 2018-04-14 DIAGNOSIS — Z85038 Personal history of other malignant neoplasm of large intestine: Secondary | ICD-10-CM

## 2018-04-14 DIAGNOSIS — F32A Depression, unspecified: Secondary | ICD-10-CM

## 2018-04-14 DIAGNOSIS — Z885 Allergy status to narcotic agent status: Secondary | ICD-10-CM

## 2018-04-14 DIAGNOSIS — Z8249 Family history of ischemic heart disease and other diseases of the circulatory system: Secondary | ICD-10-CM

## 2018-04-14 DIAGNOSIS — F329 Major depressive disorder, single episode, unspecified: Secondary | ICD-10-CM

## 2018-04-14 DIAGNOSIS — J9621 Acute and chronic respiratory failure with hypoxia: Secondary | ICD-10-CM | POA: Diagnosis present

## 2018-04-14 DIAGNOSIS — I10 Essential (primary) hypertension: Secondary | ICD-10-CM | POA: Diagnosis present

## 2018-04-14 DIAGNOSIS — F419 Anxiety disorder, unspecified: Secondary | ICD-10-CM

## 2018-04-14 DIAGNOSIS — Z9981 Dependence on supplemental oxygen: Secondary | ICD-10-CM

## 2018-04-14 DIAGNOSIS — R0602 Shortness of breath: Secondary | ICD-10-CM | POA: Diagnosis not present

## 2018-04-14 DIAGNOSIS — J441 Chronic obstructive pulmonary disease with (acute) exacerbation: Secondary | ICD-10-CM

## 2018-04-14 DIAGNOSIS — C61 Malignant neoplasm of prostate: Secondary | ICD-10-CM | POA: Diagnosis not present

## 2018-04-14 DIAGNOSIS — T380X5A Adverse effect of glucocorticoids and synthetic analogues, initial encounter: Secondary | ICD-10-CM | POA: Diagnosis present

## 2018-04-14 DIAGNOSIS — Z87891 Personal history of nicotine dependence: Secondary | ICD-10-CM

## 2018-04-14 DIAGNOSIS — J9611 Chronic respiratory failure with hypoxia: Secondary | ICD-10-CM | POA: Diagnosis present

## 2018-04-14 DIAGNOSIS — F319 Bipolar disorder, unspecified: Secondary | ICD-10-CM | POA: Diagnosis present

## 2018-04-14 DIAGNOSIS — N400 Enlarged prostate without lower urinary tract symptoms: Secondary | ICD-10-CM | POA: Diagnosis present

## 2018-04-14 DIAGNOSIS — Z7952 Long term (current) use of systemic steroids: Secondary | ICD-10-CM

## 2018-04-14 DIAGNOSIS — E785 Hyperlipidemia, unspecified: Secondary | ICD-10-CM | POA: Diagnosis present

## 2018-04-14 DIAGNOSIS — Z79899 Other long term (current) drug therapy: Secondary | ICD-10-CM

## 2018-04-14 DIAGNOSIS — Z7984 Long term (current) use of oral hypoglycemic drugs: Secondary | ICD-10-CM

## 2018-04-14 DIAGNOSIS — Z801 Family history of malignant neoplasm of trachea, bronchus and lung: Secondary | ICD-10-CM

## 2018-04-14 DIAGNOSIS — Z7951 Long term (current) use of inhaled steroids: Secondary | ICD-10-CM

## 2018-04-14 DIAGNOSIS — E119 Type 2 diabetes mellitus without complications: Secondary | ICD-10-CM

## 2018-04-14 DIAGNOSIS — J4551 Severe persistent asthma with (acute) exacerbation: Secondary | ICD-10-CM | POA: Diagnosis present

## 2018-04-14 LAB — CBC WITH DIFFERENTIAL/PLATELET
Abs Immature Granulocytes: 0.05 10*3/uL (ref 0.00–0.07)
Basophils Absolute: 0 10*3/uL (ref 0.0–0.1)
Basophils Relative: 1 %
EOS PCT: 2 %
Eosinophils Absolute: 0.1 10*3/uL (ref 0.0–0.5)
HCT: 38.8 % — ABNORMAL LOW (ref 39.0–52.0)
HEMOGLOBIN: 12.4 g/dL — AB (ref 13.0–17.0)
Immature Granulocytes: 1 %
Lymphocytes Relative: 10 %
Lymphs Abs: 0.5 10*3/uL — ABNORMAL LOW (ref 0.7–4.0)
MCH: 31.5 pg (ref 26.0–34.0)
MCHC: 32 g/dL (ref 30.0–36.0)
MCV: 98.5 fL (ref 80.0–100.0)
Monocytes Absolute: 0.4 10*3/uL (ref 0.1–1.0)
Monocytes Relative: 7 %
Neutro Abs: 4.1 10*3/uL (ref 1.7–7.7)
Neutrophils Relative %: 79 %
Platelets: 236 10*3/uL (ref 150–400)
RBC: 3.94 MIL/uL — AB (ref 4.22–5.81)
RDW: 14.2 % (ref 11.5–15.5)
WBC: 5.1 10*3/uL (ref 4.0–10.5)
nRBC: 0 % (ref 0.0–0.2)

## 2018-04-14 LAB — BASIC METABOLIC PANEL
ANION GAP: 9 (ref 5–15)
BUN: 13 mg/dL (ref 8–23)
CO2: 33 mmol/L — ABNORMAL HIGH (ref 22–32)
Calcium: 8.9 mg/dL (ref 8.9–10.3)
Chloride: 96 mmol/L — ABNORMAL LOW (ref 98–111)
Creatinine, Ser: 0.77 mg/dL (ref 0.61–1.24)
GFR calc Af Amer: >60 mL/min (ref >60)
GFR calc non Af Amer: >60 mL/min (ref >60)
Glucose, Bld: 108 mg/dL — ABNORMAL HIGH (ref 70–99)
Potassium: 4.5 mmol/L (ref 3.5–5.1)
Sodium: 138 mmol/L (ref 135–145)

## 2018-04-14 LAB — TROPONIN I: Troponin I: <0.03 ng/mL (ref <0.03)

## 2018-04-14 LAB — BRAIN NATRIURETIC PEPTIDE: B Natriuretic Peptide: 33.1 pg/mL (ref 0.0–100.0)

## 2018-04-14 MED ORDER — IPRATROPIUM-ALBUTEROL 0.5-2.5 (3) MG/3ML IN SOLN
3.0000 mL | Freq: Once | RESPIRATORY_TRACT | Status: AC
  Administered 2018-04-14: 3 mL via RESPIRATORY_TRACT
  Filled 2018-04-14: qty 3

## 2018-04-14 MED ORDER — LEVOFLOXACIN IN D5W 750 MG/150ML IV SOLN
750.0000 mg | INTRAVENOUS | Status: DC
  Administered 2018-04-14: 750 mg via INTRAVENOUS
  Filled 2018-04-14: qty 150

## 2018-04-14 MED ORDER — SODIUM CHLORIDE 0.9 % IV SOLN
INTRAVENOUS | Status: DC
  Administered 2018-04-14: 20:00:00 via INTRAVENOUS

## 2018-04-14 MED ORDER — IPRATROPIUM-ALBUTEROL 0.5-2.5 (3) MG/3ML IN SOLN
3.0000 mL | Freq: Four times a day (QID) | RESPIRATORY_TRACT | Status: DC
  Administered 2018-04-14 – 2018-04-15 (×3): 3 mL via RESPIRATORY_TRACT
  Filled 2018-04-14 (×3): qty 3

## 2018-04-14 MED ORDER — ALBUTEROL SULFATE (2.5 MG/3ML) 0.083% IN NEBU
2.5000 mg | INHALATION_SOLUTION | RESPIRATORY_TRACT | Status: DC | PRN
  Administered 2018-04-16: 2.5 mg via RESPIRATORY_TRACT
  Filled 2018-04-14: qty 3

## 2018-04-14 MED ORDER — METHYLPREDNISOLONE SODIUM SUCC 125 MG IJ SOLR
125.0000 mg | Freq: Once | INTRAMUSCULAR | Status: AC
  Administered 2018-04-14: 125 mg via INTRAVENOUS
  Filled 2018-04-14: qty 2

## 2018-04-14 MED ORDER — METHYLPREDNISOLONE SODIUM SUCC 125 MG IJ SOLR
80.0000 mg | Freq: Four times a day (QID) | INTRAMUSCULAR | Status: DC
  Administered 2018-04-14 – 2018-04-15 (×3): 80 mg via INTRAVENOUS
  Administered 2018-04-15: 62.5 mg via INTRAVENOUS
  Administered 2018-04-15 – 2018-04-16 (×4): 80 mg via INTRAVENOUS
  Filled 2018-04-14 (×8): qty 2

## 2018-04-14 MED ORDER — PREDNISONE 20 MG PO TABS
20.0000 mg | ORAL_TABLET | Freq: Once | ORAL | Status: AC
  Administered 2018-04-14: 25 mg via ORAL

## 2018-04-14 MED ORDER — ONDANSETRON HCL 4 MG PO TABS
4.0000 mg | ORAL_TABLET | Freq: Four times a day (QID) | ORAL | Status: DC | PRN
  Administered 2018-04-15: 4 mg via ORAL
  Filled 2018-04-14: qty 1

## 2018-04-14 MED ORDER — PREDNISONE 5 MG PO TABS: ORAL_TABLET | ORAL | 0 refills | Status: DC

## 2018-04-14 MED ORDER — SODIUM CHLORIDE 0.9 % IV BOLUS
500.0000 mL | Freq: Once | INTRAVENOUS | Status: AC
  Administered 2018-04-14: 500 mL via INTRAVENOUS

## 2018-04-14 MED ORDER — ONDANSETRON HCL 4 MG/2ML IJ SOLN: 4.0000 mg | Freq: Four times a day (QID) | INTRAMUSCULAR | Status: DC | PRN

## 2018-04-14 MED ORDER — ENOXAPARIN SODIUM 40 MG/0.4ML ~~LOC~~ SOLN
40.0000 mg | SUBCUTANEOUS | Status: DC
  Administered 2018-04-14 – 2018-04-15 (×2): 40 mg via SUBCUTANEOUS
  Filled 2018-04-14 (×2): qty 0.4

## 2018-04-14 MED ORDER — PREDNISONE 50 MG PO TABS
ORAL_TABLET | ORAL | Status: AC
  Filled 2018-04-14: qty 1

## 2018-04-14 MED ORDER — ONDANSETRON HCL 4 MG/2ML IJ SOLN
4.0000 mg | Freq: Once | INTRAMUSCULAR | Status: AC
  Administered 2018-04-14: 4 mg via INTRAVENOUS
  Filled 2018-04-14: qty 2

## 2018-04-14 MED ORDER — SULFAMETHOXAZOLE-TRIMETHOPRIM 800-160 MG PO TABS: 1.0000 | ORAL_TABLET | Freq: Two times a day (BID) | ORAL | 0 refills | Status: DC

## 2018-04-14 NOTE — ED Notes (Signed)
ED TO INPATIENT HANDOFF REPORT  ED Nurse Name and Phone #: Claiborne Billings [phone]  S Name/Age/Gender Taylor [name] 63 y.o. male Room/Bed: WA20/WA20  Code Status   Code Status: Prior  Home/SNF/Other Home Patient oriented to: self, place, time and situation Is this baseline? Yes   Triage Complete: Triage complete  Chief Complaint Shortness of breath  Triage Note Pt was just finished with radiation appt at cancer center and was picked up by his ride but having severe SOb that brought to ED. Pt on 2 L O2 continuously and reports was SOb for several days and wheezing. Pt denies cough.    Allergies Allergies  Allergen Reactions  . Codeine Nausea And Vomiting    Level of Care/Admitting Diagnosis ED Disposition    ED Disposition Condition Comment   Admit  Hospital Area: [facility] [[id number]]  Level of Care: Telemetry [5]  Admit to tele based on following criteria: Other see comments  Comments: hypoxia  Diagnosis: COPD with acute exacerbation ([place]) [[id number]]  Admitting Physician: Elwyn Reach [[id number]]  Attending Physician: Elwyn Reach [[id number]]  PT Class (Do Not Modify): Observation [104]  PT Acc Code (Do Not Modify): Observation [10022]       B Medical/Surgery History Past Medical History:  Diagnosis Date  . Acute on chronic respiratory failure ([place])   . Asthma   . Bipolar 1 disorder ([place]) 02/05/2012  . Colon cancer ([place]) 04/11/11   adenocarcinoma of colon, 7/19 nodes pos.FINISHED CHEMO/DR. SHERRILL  . COPD (chronic obstructive pulmonary disease) ([place])    SMOKER  . Depression   . Dyspnea   . Emphysema of lung ([place])   . Full dentures   . Hemorrhoids   . On home oxygen therapy    "3L; 24/7" (05/29/2017)  . Oxygen deficiency   . Pneumonia ~ 2016   "double pneumonia"  . Prostate cancer ([place])    Gleason score = 7, supposed to have radiation therapy but he has not followed up (05/29/2017)  . Rib fractures    hx of   Past Surgical History:   Procedure Laterality Date  . COLON SURGERY  04/11/11   Sigmoid colectomy  . COLOSTOMY REVISION  04/11/2011   Procedure: COLON RESECTION SIGMOID;  Surgeon: [name], MD;  [facility];  Service: General;  Laterality: N/A;  low anterior colon resection   . GOLD SEED IMPLANT N/A 02/24/2018   Procedure: GOLD SEED IMPLANT;  Surgeon: [name], MD;  [facility];  Service: Urology;  Laterality: N/A;  . HERNIA REPAIR    . INGUINAL HERNIA REPAIR Right 05/01/2012   Procedure: HERNIA REPAIR INGUINAL ADULT;  Surgeon: [name], MD;  [facility];  Service: General;  Laterality: Right;  . INSERTION OF MESH Right 05/01/2012   Procedure: INSERTION OF MESH;  Surgeon: [name], MD;  [facility];  Service: General;  Laterality: Right;  . PORT-A-CATH REMOVAL Left 05/01/2012   Procedure: REMOVAL Infusion Port;  Surgeon: [name], MD;  [facility];  Service: General;  Laterality: Left;  . PORTACATH PLACEMENT  05/02/2011   Procedure: INSERTION PORT-A-CATH;  Surgeon: [name], MD;  [facility];  Service: General;  Laterality: N/A;  . PROSTATE BIOPSY    . SPACE OAR INSTILLATION N/A 02/24/2018   Procedure: SPACE OAR INSTILLATION;  Surgeon: [name], MD;  [facility];  Service: Urology;  Laterality: N/A;     A IV Location/Drains/Wounds Patient Lines/Drains/Airways Status  Active Line/Drains/Airways    Name:   Placement date:   Placement time:   Site:   Days:   Peripheral IV 04/14/18 Right Antecubital   04/14/18    1718    Antecubital   less than 1          Intake/Output Last 24 hours  Intake/Output Summary (Last 24 hours) at 04/14/2018 1928 Last data filed at 04/14/2018 1811 Gross per 24 hour  Intake 500 ml  Output -  Net 500 ml    Labs/Imaging Results for orders placed or performed during the hospital encounter of 04/14/18 (from the past 48 hour(s))  Basic metabolic panel     Status: Abnormal   Collection Time: 04/14/18  5:17 PM  Result Value  Ref Range   Sodium 138 135 - 145 mmol/L   Potassium 4.5 3.5 - 5.1 mmol/L   Chloride 96 (L) 98 - 111 mmol/L   CO2 33 (H) 22 - 32 mmol/L   Glucose, Bld 108 (H) 70 - 99 mg/dL   BUN 13 8 - 23 mg/dL   Creatinine, Ser 0.77 0.61 - 1.24 mg/dL   Calcium 8.9 8.9 - 10.3 mg/dL   GFR calc non Af Amer >60 >60 mL/min   GFR calc Af Amer >60 >60 mL/min   Anion gap 9 5 - 15    Comment: Performed at [facility], [place] [address] [place], [place] [address]  Troponin I - Once     Status: None   Collection Time: 04/14/18  5:17 PM  Result Value Ref Range   Troponin I <0.03 <0.03 ng/mL    Comment: Performed at [facility], [place] [address], [place], [place] [address]  CBC with Differential     Status: Abnormal   Collection Time: 04/14/18  5:17 PM  Result Value Ref Range   WBC 5.1 4.0 - 10.5 K/uL   RBC 3.94 (L) 4.22 - 5.81 MIL/uL   Hemoglobin 12.4 (L) 13.0 - 17.0 g/dL   HCT 38.8 (L) 39.0 - 52.0 %   MCV 98.5 80.0 - 100.0 fL   MCH 31.5 26.0 - 34.0 pg   MCHC 32.0 30.0 - 36.0 g/dL   RDW 14.2 11.5 - 15.5 %   Platelets 236 150 - 400 K/uL   nRBC 0.0 0.0 - 0.2 %   Neutrophils Relative % 79 %   Neutro Abs 4.1 1.7 - 7.7 K/uL   Lymphocytes Relative 10 %   Lymphs Abs 0.5 (L) 0.7 - 4.0 K/uL   Monocytes Relative 7 %   Monocytes Absolute 0.4 0.1 - 1.0 K/uL   Eosinophils Relative 2 %   Eosinophils Absolute 0.1 0.0 - 0.5 K/uL   Basophils Relative 1 %   Basophils Absolute 0.0 0.0 - 0.1 K/uL   Immature Granulocytes 1 %   Abs Immature Granulocytes 0.05 0.00 - 0.07 K/uL    Comment: Performed at [facility], [place] [address], [place], [place] [address]  Brain natriuretic peptide     Status: None   Collection Time: 04/14/18  5:18 PM  Result Value Ref Range   B Natriuretic Peptide 33.1 0.0 - 100.0 pg/mL    Comment: Performed at [facility], [place] [address], [place], [place] [address]   Dg Chest Portable 1 View  Result Date:  04/14/2018 CLINICAL DATA:  Shortness of breath. EXAM: PORTABLE CHEST 1 VIEW COMPARISON:  Radiographs of April 03, 2018. FINDINGS: The heart size and mediastinal contours are within normal limits. Both  lungs are clear. No pneumothorax or pleural effusion is noted. Old right rib fractures are noted. IMPRESSION: No acute cardiopulmonary abnormality seen. Emphysema (ICD10-J43.9). Electronically Signed   By: Marijo Conception, M.D.   On: 04/14/2018 17:30    Pending Labs [employer] (From admission, onward)    Start     Ordered   Signed and Held  CBC  (enoxaparin (LOVENOX)    CrCl >/= 30 ml/min)  Once,   R    Comments:  Baseline for enoxaparin therapy IF NOT ALREADY DRAWN.  Notify MD if PLT < 100 K.    Signed and Held   Signed and Held  Creatinine, serum  (enoxaparin (LOVENOX)    CrCl >/= 30 ml/min)  Once,   R    Comments:  Baseline for enoxaparin therapy IF NOT ALREADY DRAWN.    Signed and Held   Signed and Held  Creatinine, serum  (enoxaparin (LOVENOX)    CrCl >/= 30 ml/min)  Weekly,   R    Comments:  while on enoxaparin therapy    Signed and Held   Signed and Held  Comprehensive metabolic panel  Tomorrow morning,   R     Signed and Held   Signed and Held  CBC  Tomorrow morning,   R     Signed and Held          Vitals/Pain Today's Vitals   04/14/18 1700 04/14/18 1730 04/14/18 1800 04/14/18 1830  BP: 120/75 121/79 134/78 110/67  Pulse: (!) 102 96 (!) 107 91  Resp: (!) 28 20 20  (!) 27  Temp:      TempSrc:      SpO2: 100% 100% 99% 98%  Weight:      Height:      PainSc:        Isolation Precautions No active isolations  Medications Medications  ipratropium-albuterol (DUONEB) 0.5-2.5 (3) MG/3ML nebulizer solution 3 mL (3 mLs Nebulization Given 04/14/18 1735)  ipratropium-albuterol (DUONEB) 0.5-2.5 (3) MG/3ML nebulizer solution 3 mL (3 mLs Nebulization Given 04/14/18 1728)  ipratropium-albuterol (DUONEB) 0.5-2.5 (3) MG/3ML nebulizer solution 3 mL (3 mLs Nebulization Given  04/14/18 1719)  sodium chloride 0.9 % bolus 500 mL (0 mLs Intravenous Stopped 04/14/18 1811)  ondansetron (ZOFRAN) injection 4 mg (4 mg Intravenous Given 04/14/18 1812)  methylPREDNISolone sodium succinate (SOLU-MEDROL) 125 mg/2 mL injection 125 mg (125 mg Intravenous Given 04/14/18 1900)    Mobility walks Moderate fall risk   Focused Assessments Pulmonary Assessment Handoff:  Lung sounds: Bilateral Breath Sounds: Diminished O2 Device: Nasal Cannula O2 Flow Rate (L/min): 3 L/min      R Recommendations: See Admitting Provider Note  Report given to:   Additional Notes:  Pt reports home o2 at home at [facility]
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
## 2018-04-14 NOTE — Patient Instructions (Signed)
Chronic Obstructive Pulmonary Disease Chronic obstructive pulmonary disease (COPD) is a long-term (chronic) lung problem. When you have COPD, it is hard for air to get in and out of your lungs. Usually the condition gets worse over time, and your lungs will never return to normal. There are things you can do to keep yourself as healthy as possible.  Your doctor may treat your condition with: ? Medicines. ? Oxygen. ? Lung surgery.  Your doctor may also recommend: ? Rehabilitation. This includes steps to make your body work better. It may involve a team of specialists. ? Quitting smoking, if you smoke. ? Exercise and changes to your diet. ? Comfort measures (palliative care). Follow these instructions at home: Medicines  Take over-the-counter and prescription medicines only as told by your doctor.  Talk to your doctor before taking any cough or allergy medicines. You may need to avoid medicines that cause your lungs to be dry. Lifestyle  If you smoke, stop. Smoking makes the problem worse. If you need help quitting, ask your doctor.  Avoid being around things that make your breathing worse. This may include smoke, chemicals, and fumes.  Stay active, but remember to rest as well.  Learn and use tips on how to relax.  Make sure you get enough sleep. Most adults need at least 7 hours of sleep every night.  Eat healthy foods. Eat smaller meals more often. Rest before meals. Controlled breathing Learn and use tips on how to control your breathing as told by your doctor. Try:  Breathing in (inhaling) through your nose for 1 second. Then, pucker your lips and breath out (exhale) through your lips for 2 seconds.  Putting one hand on your belly (abdomen). Breathe in slowly through your nose for 1 second. Your hand on your belly should move out. Pucker your lips and breathe out slowly through your lips. Your hand on your belly should move in as you breathe out.  Controlled coughing Learn  and use controlled coughing to clear mucus from your lungs. Follow these steps: 1. Lean your head a little forward. 2. Breathe in deeply. 3. Try to hold your breath for 3 seconds. 4. Keep your mouth slightly open while coughing 2 times. 5. Spit any mucus out into a tissue. 6. Rest and do the steps again 1 or 2 times as needed. General instructions  Make sure you get all the shots (vaccines) that your doctor recommends. Ask your doctor about a flu shot and a pneumonia shot.  Use oxygen therapy and pulmonary rehabilitation if told by your doctor. If you need home oxygen therapy, ask your doctor if you should buy a tool to measure your oxygen level (oximeter).  Make a COPD action plan with your doctor. This helps you to know what to do if you feel worse than usual.  Manage any other conditions you have as told by your doctor.  Avoid going outside when it is very hot, cold, or humid.  Avoid people who have a sickness you can catch (contagious).  Keep all follow-up visits as told by your doctor. This is important. Contact a doctor if:  You cough up more mucus than usual.  There is a change in the color or thickness of the mucus.  It is harder to breathe than usual.  Your breathing is faster than usual.  You have trouble sleeping.  You need to use your medicines more often than usual.  You have trouble doing your normal activities such as getting dressed   or walking around the house. Get help right away if:  You have shortness of breath while resting.  You have shortness of breath that stops you from: ? Being able to talk. ? Doing normal activities.  Your chest hurts for longer than 5 minutes.  Your skin color is more blue than usual.  Your pulse oximeter shows that you have low oxygen for longer than 5 minutes.  You have a fever.  You feel too tired to breathe normally. Summary  Chronic obstructive pulmonary disease (COPD) is a long-term lung problem.  The way your  lungs work will never return to normal. Usually the condition gets worse over time. There are things you can do to keep yourself as healthy as possible.  Take over-the-counter and prescription medicines only as told by your doctor.  If you smoke, stop. Smoking makes the problem worse. This information is not intended to replace advice given to you by your health care provider. Make sure you discuss any questions you have with your health care provider. Document Released: 06/26/2007 Document Revised: 02/12/2016 Document Reviewed: 02/12/2016 Elsevier Interactive Patient Education  2019 [employer] Inc.  

## 2018-04-14 NOTE — ED Triage Notes (Signed)
Pt was just finished with [facility] appt at [facility] and was picked up by his ride but having severe SOb that brought to ED. Pt on 2 L O2 continuously and reports was SOb for several days and wheezing. Pt denies cough.

## 2018-04-14 NOTE — Progress Notes (Signed)
Received call from lobby front desk, pt reporting increased SOB.  RN Learta Codding to assess in lobby, NP Mendel Ryder assisted with VS.  Pt using home oxygen tank at 3L [place] (baseline), pt checked in for radiation appt at 1500 but states "I don't want to go right now, not until I can breathe better.  I get these spells when my COPD gets worse."  VSS (see chart).  Afebrile.  A&Ox4.  Hx COPD and multiple ED visits for exacerbations.  Pt homeless/in shelter, states "the [facility] doesn't want to see me but I'm still getting oxygen from them".  Denies SI/HI/AVH, hx depression.  Denies any current concerns besides SOB.  Denies CP or N/V/D or changes in bowels/bladder.  Denies recent travel or cough.  Pt taken to [facility] for evaluation by PA Sandi Mealy, radiation made aware by front desk.  Pt given oral prednisone.  Reports feeling better after resting a few minutes and drinking water with medication.  Pt states he feels well enough to receive radiation today.  Called radiation back, aware pt is on the way down for tx.  Spoke with pt's transportation organization/[employer] ([facility]), they will wait for pt to be finished by around [phone].  Pt aware.  Pt VU to call as needed for other acute changes or concerns.

## 2018-04-14 NOTE — ED Notes (Signed)
Pt with 1 occurrence of nausea, dry heaves. MD made aware.

## 2018-04-14 NOTE — H&P (Signed)
History and Physical   [name] KPT:[id number] DOB: 1955/10/01 DOA: 04/14/2018  Referring MD/NP/PA: Dr. Laverta Baltimore  PCP: Clent Demark, PA-C   Outpatient Specialists: Dr. Tyler Pita radiation oncology   Patient coming from: Home.  [facility] is at [facility]  Chief Complaint: Shortness of breath  HPI: Taylor [name] is a 63 y.o. male with medical history significant of Prostate cancer on radiation therapy, COPD, bipolar disorder, diabetes, who presents with progressive shortness of breath cough and wheezing for the last 2 days.  Patient was scheduled to have appointment today for his radiation therapy.  He became so short of breath that he was unable to do it.  He called the cancer center when there initially but sent over to the ER.  He denied any chest pain.  Denied any nausea or vomiting.  He has had significant shortness of breath and wheezing.  Initial treatment in the ER did not resolve his symptoms.  Patient has had home oxygen at 3 L.  He is requiring more than 3 L in the ER.  He is being admitted to the hospital therefore for treatment of acute on chronic respiratory failure due to COPD exacerbation..  ED Course: His temperature is 98.6 with blood pressure 160/67, pulse is 107 with respiratory rate of 28 and oxygen sat 96% on 4 L.  Sodium 138 his potassium 4.5 chloride 96 with CO2 33.  The rest of the chemistry and CBC appears to be within normal.  Troponin is negative and BNP of 33.  Chest x-ray showed no acute cardiopulmonary findings.  Patient initiated on nebulizer treatment and steroids and not being admitted for treatment.  Review of Systems: As per HPI otherwise 10 point review of systems negative.    Past Medical History:  Diagnosis Date   Acute on chronic respiratory failure (HCC)    Asthma    Bipolar 1 disorder ([place]) 02/05/2012   Colon cancer ([name]) 04/11/11   adenocarcinoma of colon, 7/19 nodes pos.FINISHED CHEMO/DR. SHERRILL   COPD (chronic  obstructive pulmonary disease) ([name])    SMOKER   Depression    Dyspnea    Emphysema of lung ([name])    Full dentures    Hemorrhoids    On home oxygen therapy    "3L; 24/7" (05/29/2017)   Oxygen deficiency    Pneumonia ~ 2016   "double pneumonia"   Prostate cancer ([name])    Gleason score = 7, supposed to have radiation therapy but he has not followed up (05/29/2017)   Rib fractures    hx of    Past Surgical History:  Procedure Laterality Date   COLON SURGERY  04/11/11   Sigmoid colectomy   COLOSTOMY REVISION  04/11/2011   Procedure: COLON RESECTION SIGMOID;  Surgeon: [name], MD;  Location: [facility];  Service: General;  Laterality: N/A;  low anterior colon resection    GOLD SEED IMPLANT N/A 02/24/2018   Procedure: GOLD SEED IMPLANT;  Surgeon: [name], MD;  Location: [facility];  Service: Urology;  Laterality: N/A;   HERNIA REPAIR     INGUINAL HERNIA REPAIR Right 05/01/2012   Procedure: HERNIA REPAIR INGUINAL ADULT;  Surgeon: [name], MD;  Location: [facility];  Service: General;  Laterality: Right;   INSERTION OF MESH Right 05/01/2012   Procedure: INSERTION OF MESH;  Surgeon: [name], MD;  Location: [facility];  Service: General;  Laterality: Right;   PORT-A-CATH REMOVAL Left 05/01/2012   Procedure: REMOVAL Infusion Port;  Surgeon: [name], MD;  Location: [facility];  Service: General;  Laterality: Left;   PORTACATH PLACEMENT  05/02/2011   Procedure: INSERTION PORT-A-CATH;  Surgeon: [name], MD;  Location: [facility];  Service: General;  Laterality: N/A;   PROSTATE BIOPSY     SPACE OAR INSTILLATION N/A 02/24/2018   Procedure: SPACE OAR INSTILLATION;  Surgeon: [name], MD;  Location: [facility];  Service: Urology;  Laterality: N/A;     reports that he quit smoking about 10 months ago. His smoking use included cigarettes. He has a 4.60 pack-year smoking history. He has quit using smokeless tobacco.  His smokeless tobacco use included chew. He reports previous  alcohol use. He reports that he does not use drugs.  Allergies  Allergen Reactions   Codeine Nausea And Vomiting    Family History  Problem Relation Age of Onset   Heart disease Father    Heart failure Mother    Heart disease Mother    Lung cancer Maternal Uncle        smoked     Prior to Admission medications   Medication Sig Start Date End Date Taking? Authorizing Provider  albuterol (PROVENTIL) (2.5 MG/3ML) 0.083% nebulizer solution Take 3 mLs (2.5 mg total) by nebulization every 4 (four) hours as needed for wheezing or shortness of breath (if you can't catch your breath). 04/06/18   [name], MD  ARIPiprazole (ABILIFY) 5 MG tablet Take 5 mg by mouth daily.    [provider]  benzonatate (TESSALON) 100 MG capsule Take 1 capsule (100 mg total) by mouth every 8 (eight) hours. Patient taking differently: Take 100 mg by mouth every 8 (eight) hours as needed for cough.  01/19/18   [name], MD  Blood Glucose Monitoring Suppl (ACCU-CHEK AVIVA) device Use as instructed 10/27/17 10/27/18  Clent Demark, PA-C  escitalopram (LEXAPRO) 20 MG tablet Take 20 mg by mouth daily.    [provider]  glucose blood (ACCU-CHEK AVIVA) test strip 1 each by Other route as needed for other. Use as instructed 10/27/17   Clent Demark, PA-C  Lancets [facility]) lancets 1 each by Other route 2 (two) times daily. Use as instructed 10/27/17   Clent Demark, PA-C  loperamide (IMODIUM) 2 MG capsule Take 2 every 6 hours if continued diarrhea 04/10/18   [name], MD  lovastatin (MEVACOR) 20 MG tablet Take 1 tablet (20 mg total) by mouth at bedtime. 10/27/17   Clent Demark, PA-C  metFORMIN (GLUCOPHAGE) 1000 MG tablet Take 1 tablet (1,000 mg total) by mouth 2 (two) times daily with a meal. 10/27/17   Clent Demark, PA-C  mometasone-formoterol [facility]) 100-5 MCG/ACT AERO Inhale 2 puffs into the lungs 2 (two) times daily. 04/06/18   [name],  MD  OXYGEN Inhale 3 L into the lungs continuous.     [provider]  polyethylene glycol (MIRALAX / GLYCOLAX) packet Take 17 g by mouth daily. Patient taking differently: Take 17 g by mouth daily as needed for mild constipation.  01/19/18   [name], MD  predniSONE (DELTASONE) 20 MG tablet TAKE 2 TABLETS (40 MG TOTAL) BY MOUTH DAILY WITH BREAKFAST FOR 4 DAYS. 04/07/18   [provider]  predniSONE (DELTASONE) 5 MG tablet 6 tab x 1 day, 5 tab x 1 day, 4 tab x 1 day, 3 tab x 1 day, 2 tab x 1 day, 1 tab x 1 day, stop 04/14/18   Tanner,  Lyndon Code., PA-C  senna-docusate (SENOKOT-S) 8.6-50 MG tablet Take 1 tablet by mouth 2 (two) times daily. Patient taking differently: Take 1 tablet by mouth daily as needed for mild constipation.  01/19/18   [name], MD  sulfamethoxazole-trimethoprim (BACTRIM DS,SEPTRA DS) 800-160 MG tablet Take 1 tablet by mouth 2 (two) times daily. 04/14/18   Tanner, Lyndon Code., PA-C  tamsulosin (FLOMAX) 0.4 MG CAPS capsule Take 1 capsule (0.4 mg total) by mouth daily. 03/13/18   Shelly Coss, MD  tiotropium (SPIRIVA) 18 MCG inhalation capsule Place 1 capsule (18 mcg total) into inhaler and inhale daily. 04/06/18   [name], MD    Physical Exam: Vitals:   04/14/18 1929 04/14/18 2002 04/14/18 2005 04/14/18 2025  BP: 105/69  117/72   Pulse: 100  96   Resp: (!) 23  (!) 22   Temp:   98.4 F (36.9 C)   TempSrc:   Oral   SpO2: 99%  96% 96%  Weight:  79 kg    Height:  5\' 11"  (1.803 m)        Constitutional: NAD, calm, restless Vitals:   04/14/18 1929 04/14/18 2002 04/14/18 2005 04/14/18 2025  BP: 105/69  117/72   Pulse: 100  96   Resp: (!) 23  (!) 22   Temp:   98.4 F (36.9 C)   TempSrc:   Oral   SpO2: 99%  96% 96%  Weight:  79 kg    Height:  5\' 11"  (1.803 m)     Eyes: PERRL, lids and conjunctivae normal ENMT: Mucous membranes are moist. Posterior pharynx clear of any exudate or lesions.Normal dentition.  Neck: normal, supple, no masses,  no thyromegaly Respiratory: Decreased air entry with diffuse expiratory wheezing, no crackles. Normal respiratory effort.  Some accessory muscle use.  Cardiovascular: Regular rate and rhythm, no murmurs / rubs / gallops. No extremity edema. 2+ pedal pulses. No carotid bruits.  Abdomen: no tenderness, no masses palpated. No hepatosplenomegaly. Bowel sounds positive.  Musculoskeletal: no clubbing / cyanosis. No joint deformity upper and lower extremities. Good ROM, no contractures. Normal muscle tone.  Skin: no rashes, lesions, ulcers. No induration Neurologic: CN 2-12 grossly intact. Sensation intact, DTR normal. Strength 5/5 in all 4.  Psychiatric: Normal judgment and insight. Alert and oriented x 3. Normal mood.     Labs on Admission: I have personally reviewed following labs and imaging studies  CBC: Recent Labs  Lab 04/10/18 1428 04/14/18 1717  WBC 5.4 5.1  NEUTROABS  --  4.1  HGB 11.5* 12.4*  HCT 35.4* 38.8*  MCV 95.9 98.5  PLT 179 712   Basic Metabolic Panel: Recent Labs  Lab 04/10/18 1428 04/14/18 1717  NA 138 138  K 4.0 4.5  CL 98 96*  CO2 31 33*  GLUCOSE 98 108*  BUN 10 13  CREATININE 0.70 0.77  CALCIUM 8.9 8.9   GFR: Estimated Creatinine Clearance: 100.7 mL/min (by C-G formula based on SCr of 0.77 mg/dL). Liver Function Tests: No results for input(s): AST, ALT, ALKPHOS, BILITOT, PROT, ALBUMIN in the last 168 hours. No results for input(s): LIPASE, AMYLASE in the last 168 hours. No results for input(s): AMMONIA in the last 168 hours. Coagulation Profile: No results for input(s): INR, PROTIME in the last 168 hours. Cardiac Enzymes: Recent Labs  Lab 04/14/18 1717  TROPONINI <0.03   BNP (last 3 results) No results for input(s): PROBNP in the last 8760 hours. HbA1C: No results for input(s): HGBA1C in the last 72  hours. CBG: Recent Labs  Lab 04/10/18 1408  GLUCAP 93   Lipid Profile: No results for input(s): CHOL, HDL, LDLCALC, TRIG, CHOLHDL,  LDLDIRECT in the last 72 hours. Thyroid Function Tests: No results for input(s): TSH, T4TOTAL, FREET4, T3FREE, THYROIDAB in the last 72 hours. Anemia Panel: No results for input(s): VITAMINB12, FOLATE, FERRITIN, TIBC, IRON, RETICCTPCT in the last 72 hours. Urine analysis:    Component Value Date/Time   COLORURINE YELLOW 01/17/2018 [place] 01/17/2018 1731   LABSPEC 1.036 (H) 01/17/2018 1731   PHURINE 5.0 01/17/2018 1731   GLUCOSEU >=500 (A) 01/17/2018 1731   HGBUR NEGATIVE 01/17/2018 1731   BILIRUBINUR NEGATIVE 01/17/2018 1731   KETONESUR 5 (A) 01/17/2018 1731   PROTEINUR NEGATIVE 01/17/2018 1731   UROBILINOGEN 1.0 01/09/2013 1843   NITRITE NEGATIVE 01/17/2018 1731   LEUKOCYTESUR NEGATIVE 01/17/2018 1731   Sepsis Labs: @LABRCNTIP (procalcitonin:4,lacticidven:4) )No results found for this or any previous visit (from the past 240 hour(s)).   Radiological Exams on Admission: Dg Chest Portable 1 View  Result Date: 04/14/2018 CLINICAL DATA:  Shortness of breath. EXAM: PORTABLE CHEST 1 VIEW COMPARISON:  Radiographs of April 03, 2018. FINDINGS: The heart size and mediastinal contours are within normal limits. Both lungs are clear. No pneumothorax or pleural effusion is noted. Old right rib fractures are noted. IMPRESSION: No acute cardiopulmonary abnormality seen. Emphysema (ICD10-J43.9). Electronically Signed   By: Marijo Conception, M.D.   On: 04/14/2018 17:30    EKG: Independently reviewed.  It shows sinus tachycardia with a rate of 100.  Early repolarization, nonspecific ST changes  Assessment/Plan Principal Problem:   COPD with acute exacerbation (HCC) Active Problems:   Bipolar 1 disorder (HCC)   Prostate cancer ([place])   Type 2 diabetes mellitus ([place])   Essential hypertension   Chronic respiratory failure with hypoxia ([place])     #1 acute on chronic respiratory failure due to COPD exacerbation: Patient will be admitted and gold COPD protocol be followed.  IV  steroids nebulizer with antibiotics.  Oxygen will be continued and titrated down to his home regimen.  Once patient improves with treatment will be transition to oral therapy.  #2 prostate cancer: Patient will resume his radiation once he is recovered.  #3 bipolar disorder: Continue with home regiment.  #4 diabetes: Steroid-induced.  Initiate sliding scale insulin.  #5 essential hypertension: Continue home regimen of blood pressure medications.   DVT prophylaxis: Lovenox Code Status: Full code Family Communication: No family at bedside discussed care with patient Disposition Plan: To be determined Consults called: None but radiation oncology can be informed in the morning Admission status: Observation  Severity of Illness: The appropriate patient status for this patient is OBSERVATION. Observation status is judged to be reasonable and necessary in order to provide the required intensity of service to ensure the patient's safety. The patient's presenting symptoms, physical exam findings, and initial radiographic and laboratory data in the context of their medical condition is felt to place them at decreased risk for further clinical deterioration. Furthermore, it is anticipated that the patient will be medically stable for discharge from the hospital within 2 midnights of admission. The following factors support the patient status of observation.   " The patient's presenting symptoms include shortness of breath and cough. " The physical exam findings include bilateral wheezing. " The initial radiographic and laboratory data are no significant finding on x-ray.     Barbette Merino MD [facility] Pager [phone]  If 7PM-7AM, please contact  night-coverage [email or web address] Password [facility]  04/14/2018, 11:44 PM

## 2018-04-15 ENCOUNTER — Ambulatory Visit
Admission: RE | Admit: 2018-04-15 | Discharge: 2018-04-15 | Disposition: A | Discharge status: Home / Self Care | Payer: Medicaid Other | Source: Ambulatory Visit | Attending: Radiation Oncology | Admitting: Radiation Oncology

## 2018-04-15 ENCOUNTER — Other Ambulatory Visit: Payer: Self-pay

## 2018-04-15 DIAGNOSIS — Z79899 Other long term (current) drug therapy: Secondary | ICD-10-CM | POA: Diagnosis not present

## 2018-04-15 DIAGNOSIS — Z7952 Long term (current) use of systemic steroids: Secondary | ICD-10-CM | POA: Diagnosis not present

## 2018-04-15 DIAGNOSIS — C61 Malignant neoplasm of prostate: Secondary | ICD-10-CM | POA: Diagnosis present

## 2018-04-15 DIAGNOSIS — F319 Bipolar disorder, unspecified: Secondary | ICD-10-CM | POA: Diagnosis present

## 2018-04-15 DIAGNOSIS — J4551 Severe persistent asthma with (acute) exacerbation: Secondary | ICD-10-CM | POA: Diagnosis present

## 2018-04-15 DIAGNOSIS — E099 Drug or chemical induced diabetes mellitus without complications: Secondary | ICD-10-CM | POA: Diagnosis present

## 2018-04-15 DIAGNOSIS — Z801 Family history of malignant neoplasm of trachea, bronchus and lung: Secondary | ICD-10-CM | POA: Diagnosis not present

## 2018-04-15 DIAGNOSIS — Z87891 Personal history of nicotine dependence: Secondary | ICD-10-CM | POA: Diagnosis not present

## 2018-04-15 DIAGNOSIS — Z9981 Dependence on supplemental oxygen: Secondary | ICD-10-CM | POA: Diagnosis not present

## 2018-04-15 DIAGNOSIS — E785 Hyperlipidemia, unspecified: Secondary | ICD-10-CM | POA: Diagnosis present

## 2018-04-15 DIAGNOSIS — Z7951 Long term (current) use of inhaled steroids: Secondary | ICD-10-CM | POA: Diagnosis not present

## 2018-04-15 DIAGNOSIS — Z85038 Personal history of other malignant neoplasm of large intestine: Secondary | ICD-10-CM | POA: Diagnosis not present

## 2018-04-15 DIAGNOSIS — Z8249 Family history of ischemic heart disease and other diseases of the circulatory system: Secondary | ICD-10-CM | POA: Diagnosis not present

## 2018-04-15 DIAGNOSIS — R0602 Shortness of breath: Secondary | ICD-10-CM | POA: Diagnosis present

## 2018-04-15 DIAGNOSIS — J441 Chronic obstructive pulmonary disease with (acute) exacerbation: Secondary | ICD-10-CM | POA: Diagnosis not present

## 2018-04-15 DIAGNOSIS — N400 Enlarged prostate without lower urinary tract symptoms: Secondary | ICD-10-CM | POA: Diagnosis present

## 2018-04-15 DIAGNOSIS — Z7984 Long term (current) use of oral hypoglycemic drugs: Secondary | ICD-10-CM | POA: Diagnosis not present

## 2018-04-15 DIAGNOSIS — I10 Essential (primary) hypertension: Secondary | ICD-10-CM | POA: Diagnosis present

## 2018-04-15 DIAGNOSIS — T380X5A Adverse effect of glucocorticoids and synthetic analogues, initial encounter: Secondary | ICD-10-CM | POA: Diagnosis present

## 2018-04-15 DIAGNOSIS — J9621 Acute and chronic respiratory failure with hypoxia: Secondary | ICD-10-CM | POA: Diagnosis present

## 2018-04-15 DIAGNOSIS — Z885 Allergy status to narcotic agent status: Secondary | ICD-10-CM | POA: Diagnosis not present

## 2018-04-15 LAB — GLUCOSE, CAPILLARY
Glucose-Capillary: 232 mg/dL — ABNORMAL HIGH (ref 70–99)
Glucose-Capillary: 201 mg/dL — ABNORMAL HIGH (ref 70–99)

## 2018-04-15 LAB — CBC
HCT: 33.7 % — ABNORMAL LOW (ref 39.0–52.0)
Hemoglobin: 11 g/dL — ABNORMAL LOW (ref 13.0–17.0)
MCH: 31.3 pg (ref 26.0–34.0)
MCHC: 32.6 g/dL (ref 30.0–36.0)
MCV: 96 fL (ref 80.0–100.0)
Platelets: 183 10*3/uL (ref 150–400)
RBC: 3.51 MIL/uL — ABNORMAL LOW (ref 4.22–5.81)
RDW: 13.8 % (ref 11.5–15.5)
WBC: 2.6 10*3/uL — ABNORMAL LOW (ref 4.0–10.5)
nRBC: 0 % (ref 0.0–0.2)

## 2018-04-15 LAB — COMPREHENSIVE METABOLIC PANEL
ALT: 18 U/L (ref 0–44)
ANION GAP: 7 (ref 5–15)
AST: 18 U/L (ref 15–41)
Albumin: 3.9 g/dL (ref 3.5–5.0)
Alkaline Phosphatase: 49 U/L (ref 38–126)
BUN: 13 mg/dL (ref 8–23)
CO2: 31 mmol/L (ref 22–32)
Calcium: 8.5 mg/dL — ABNORMAL LOW (ref 8.9–10.3)
Chloride: 98 mmol/L (ref 98–111)
Creatinine, Ser: 0.62 mg/dL (ref 0.61–1.24)
GFR calc Af Amer: >60 mL/min (ref >60)
GFR calc non Af Amer: >60 mL/min (ref >60)
Glucose, Bld: 210 mg/dL — ABNORMAL HIGH (ref 70–99)
Potassium: 4.3 mmol/L (ref 3.5–5.1)
Sodium: 136 mmol/L (ref 135–145)
Total Bilirubin: 0.5 mg/dL (ref 0.3–1.2)
Total Protein: 6.2 g/dL — ABNORMAL LOW (ref 6.5–8.1)

## 2018-04-15 MED ORDER — LOPERAMIDE HCL 2 MG PO CAPS: 2.0000 mg | ORAL_CAPSULE | ORAL | Status: DC | PRN

## 2018-04-15 MED ORDER — LEVOFLOXACIN 750 MG PO TABS: 750.0000 mg | ORAL_TABLET | Freq: Every day | ORAL | Status: DC

## 2018-04-15 MED ORDER — ALBUTEROL SULFATE (2.5 MG/3ML) 0.083% IN NEBU
2.5000 mg | INHALATION_SOLUTION | Freq: Four times a day (QID) | RESPIRATORY_TRACT | Status: DC
  Administered 2018-04-15 (×2): 2.5 mg via RESPIRATORY_TRACT
  Filled 2018-04-15 (×2): qty 3

## 2018-04-15 MED ORDER — ALBUTEROL SULFATE (2.5 MG/3ML) 0.083% IN NEBU
2.5000 mg | INHALATION_SOLUTION | Freq: Three times a day (TID) | RESPIRATORY_TRACT | Status: DC
  Administered 2018-04-16: 2.5 mg via RESPIRATORY_TRACT
  Filled 2018-04-15: qty 3

## 2018-04-15 MED ORDER — MOMETASONE FURO-FORMOTEROL FUM 100-5 MCG/ACT IN AERO
2.0000 | INHALATION_SPRAY | Freq: Two times a day (BID) | RESPIRATORY_TRACT | Status: DC
  Administered 2018-04-15 – 2018-04-16 (×3): 2 via RESPIRATORY_TRACT
  Filled 2018-04-15: qty 8.8

## 2018-04-15 MED ORDER — ARIPIPRAZOLE 5 MG PO TABS
5.0000 mg | ORAL_TABLET | Freq: Every day | ORAL | Status: DC
  Administered 2018-04-15 – 2018-04-16 (×2): 5 mg via ORAL
  Filled 2018-04-15 (×2): qty 1

## 2018-04-15 MED ORDER — ESCITALOPRAM OXALATE 20 MG PO TABS
20.0000 mg | ORAL_TABLET | Freq: Every day | ORAL | Status: DC
  Administered 2018-04-15 – 2018-04-16 (×2): 20 mg via ORAL
  Filled 2018-04-15 (×2): qty 1

## 2018-04-15 MED ORDER — PRAVASTATIN SODIUM 20 MG PO TABS
20.0000 mg | ORAL_TABLET | Freq: Every day | ORAL | Status: DC
  Administered 2018-04-15: 20 mg via ORAL
  Filled 2018-04-15: qty 1

## 2018-04-15 MED ORDER — INSULIN ASPART 100 UNIT/ML ~~LOC~~ SOLN
0.0000 [IU] | Freq: Three times a day (TID) | SUBCUTANEOUS | Status: DC
  Administered 2018-04-15: 3 [IU] via SUBCUTANEOUS
  Administered 2018-04-16: 7 [IU] via SUBCUTANEOUS
  Administered 2018-04-16: 2 [IU] via SUBCUTANEOUS

## 2018-04-15 MED ORDER — TAMSULOSIN HCL 0.4 MG PO CAPS
0.4000 mg | ORAL_CAPSULE | Freq: Every day | ORAL | Status: DC
  Administered 2018-04-15 – 2018-04-16 (×2): 0.4 mg via ORAL
  Filled 2018-04-15 (×2): qty 1

## 2018-04-15 MED ORDER — TIOTROPIUM BROMIDE MONOHYDRATE 18 MCG IN CAPS
18.0000 ug | ORAL_CAPSULE | Freq: Every day | RESPIRATORY_TRACT | Status: DC
  Administered 2018-04-15 – 2018-04-16 (×2): 18 ug via RESPIRATORY_TRACT
  Filled 2018-04-15: qty 5

## 2018-04-15 MED ORDER — INSULIN ASPART 100 UNIT/ML ~~LOC~~ SOLN
0.0000 [IU] | Freq: Every day | SUBCUTANEOUS | Status: DC
  Administered 2018-04-15: 2 [IU] via SUBCUTANEOUS

## 2018-04-15 MED ORDER — LORAZEPAM 0.5 MG PO TABS
0.5000 mg | ORAL_TABLET | Freq: Once | ORAL | Status: AC
  Administered 2018-04-15: 0.5 mg via ORAL
  Filled 2018-04-15: qty 1

## 2018-04-15 NOTE — Progress Notes (Signed)
PROGRESS NOTE    Taylor [name]  [name] DOB: 1955-04-04 DOA: 04/14/2018 PCP: Taylor Demark, PA-C   Brief Narrative:  63 year old with past medical history relevant for bipolar disorder, type 2 diabetes on oral hypoglycemics, severe persistent asthma/COPD on 3 L at home, hypertension, prostate cancer getting radiation who was admitted with increasing shortness of breath and asthma/COPD exacerbation.   Assessment & Plan:   Principal Problem:   COPD with acute exacerbation (Taylor [name]) Active Problems:   Bipolar 1 disorder (Taylor [name])   Prostate cancer (Taylor [name])   Type 2 diabetes mellitus (Taylor [name])   Essential hypertension   Chronic respiratory failure with hypoxia (Taylor [name])   #) Acute on chronic severe asthma/COPD exacerbation: Patient continues to be on 3 L however he is continued to be short of breath and has minimal dyspnea with exertion. -Continue IV methylprednisolone -Continue short acting bronchodilators scheduled -Restart home L Taylor [name] -We will discontinue antibiotics as no evidence of pneumonia on chest x-ray  #) Type 2 diabetes: -Hold metformin thousand milligrams 3 times daily -Sliding scale insulin, AC at bedtime  #) Prostate cancer/BPH: - We will discuss with rad Taylor [name] about continue treatments -Continue tamsulosin 0.4 mg daily  #) Hypertension/hyperlipidemia: -Continue statin  #) Pain/psych: -Continue aripiprazole 5 mg daily -Continue escitalopram 20 mg daily  Fluids: Tolerating p.o. Electrolytes: Monitor and supplement Nutrition: Carb restricted diet   Prophylaxis: Enoxaparin  Disposition: Pending improvement of dyspnea  Full code   Consultants:   None  Procedures:   None  Antimicrobials:   None   Subjective: This morning patient reports he is somewhat better.  He denies any nausea, vomiting, diarrhea, cough, congestion, rhinorrhea.  He continues to report being short of breath with minimal exertion.  Objective: Vitals:   04/14/18  2005 04/14/18 2025 04/15/18 0515 04/15/18 0759  BP: 117/72  110/71   Pulse: 96  87 94  Resp: (!) 22  16 18   Temp: 98.4 F (36.9 C)  99.1 F (37.3 C)   TempSrc: Oral  Oral   SpO2: 96% 96% 97% 95%  Weight:      Height:        Intake/Output Summary (Last 24 hours) at 04/15/2018 1000 Last data filed at 04/15/2018 0819 Gross per 24 hour  Intake 500 ml  Output 1175 ml  Net -675 ml   Filed Weights   04/14/18 1647 04/14/18 2002  Weight: 75.8 kg 79 kg    Examination:  General exam: Appears calm and comfortable  Respiratory system: Moderately increased work of breathing, diffuse wheezing, no rhonchi, no crackles Cardiovascular system: Regular rate and rhythm, no murmurs Gastrointestinal system: Soft, nondistended, no rebound or guarding, plus bowel sounds Central nervous system: Alert and oriented.  Grossly intact, moving all extremities Extremities: No lower extremity edema Skin: No rashes over visible skin Psychiatry: Judgement and insight appear normal. Mood & affect appropriate.     Data Reviewed: I have personally reviewed following labs and imaging studies  CBC: Recent Labs  Lab 04/10/18 1428 04/14/18 1717 04/15/18 0444  WBC 5.4 5.1 2.6*  NEUTROABS  --  4.1  --   HGB 11.5* 12.4* 11.0*  HCT 35.4* 38.8* 33.7*  MCV 95.9 98.5 96.0  PLT 179 236 371   Basic Metabolic Panel: Recent Labs  Lab 04/10/18 1428 04/14/18 1717 04/15/18 0444  NA 138 138 136  K 4.0 4.5 4.3  CL 98 96* 98  CO2 31 33* 31  GLUCOSE 98 108* 210*  BUN 10 13 13   CREATININE 0.70 0.77  0.62  CALCIUM 8.9 8.9 8.5*   GFR: Estimated Creatinine Clearance: 100.7 mL/min (by C-G formula based on SCr of 0.62 mg/dL). Liver Function Tests: Recent Labs  Lab 04/15/18 0444  AST 18  ALT 18  ALKPHOS 49  BILITOT 0.5  PROT 6.2*  ALBUMIN 3.9   No results for input(s): LIPASE, AMYLASE in the last 168 hours. No results for input(s): AMMONIA in the last 168 hours. Coagulation Profile: No results for  input(s): INR, PROTIME in the last 168 hours. Cardiac Enzymes: Recent Labs  Lab 04/14/18 1717  TROPONINI <0.03   BNP (last 3 results) No results for input(s): PROBNP in the last 8760 hours. HbA1C: No results for input(s): HGBA1C in the last 72 hours. CBG: Recent Labs  Lab 04/10/18 1408  GLUCAP 93   Lipid Profile: No results for input(s): CHOL, HDL, LDLCALC, TRIG, CHOLHDL, LDLDIRECT in the last 72 hours. Thyroid Function Tests: No results for input(s): TSH, T4TOTAL, FREET4, T3FREE, THYROIDAB in the last 72 hours. Anemia Panel: No results for input(s): VITAMINB12, FOLATE, FERRITIN, TIBC, IRON, RETICCTPCT in the last 72 hours. Sepsis Labs: No results for input(s): PROCALCITON, LATICACIDVEN in the last 168 hours.  No results found for this or any previous visit (from the past 240 hour(s)).       Radiology Studies: Dg Chest Portable 1 View  Result Date: 04/14/2018 CLINICAL DATA:  Shortness of breath. EXAM: PORTABLE CHEST 1 VIEW COMPARISON:  Radiographs of April 03, 2018. FINDINGS: The heart size and mediastinal contours are within normal limits. Both lungs are clear. No pneumothorax or pleural effusion is noted. Old right rib fractures are noted. IMPRESSION: No acute cardiopulmonary abnormality seen. Emphysema (ICD10-J43.9). Electronically Signed   By: Marijo Conception, M.D.   On: 04/14/2018 17:30        Scheduled Meds: . enoxaparin (LOVENOX) injection  40 mg Subcutaneous Q24H  . ipratropium-albuterol  3 mL Nebulization Q6H  . levofloxacin  750 mg Oral QHS  . methylPREDNISolone (SOLU-MEDROL) injection  80 mg Intravenous Q6H   Continuous Infusions: . sodium chloride 100 mL/hr at 04/14/18 2013     LOS: 0 days    Time spent: 97    Taylor Folks, MD [facility]  If 7PM-7AM, please contact night-coverage [email or web address] Password [email or web address] 04/15/2018, 10:00 AM

## 2018-04-15 NOTE — Progress Notes (Signed)
Patient complained of lower abdominal discomfort. Pt asked if another dose of Flomax could be administered. Pt made aware that dose is Flomax 0.4 mg daily. Dose has been administered. Bladder scan completed post void 250 cc. Provider updated, V.O received to I&O cath x once if PVR greater than 300 cc should pt c/o discomfort. Will continue to monitor.

## 2018-04-15 NOTE — Progress Notes (Signed)
Pt had a 13 beat run of SVT with HR in the 160s on tele monitor. Pt resting in bed asymptomatic at this time. New set of vitals obtained. HR sustaining in the 120s currently. NP on call notified. Awaiting orders. Will continue to monitor closely.

## 2018-04-15 NOTE — Progress Notes (Signed)
PHARMACIST - PHYSICIAN COMMUNICATION  CONCERNING: Antibiotic IV to Oral Route Change Policy  RECOMMENDATION: This patient is receiving levofloxacin by the intravenous route.  Based on criteria approved by the Pharmacy and Therapeutics Committee, the antibiotic(s) is/are being converted to the equivalent oral dose form(s).   DESCRIPTION: These criteria include: Patient being treated for a respiratory tract infection, urinary tract infection, cellulitis or clostridium difficile associated diarrhea if on metronidazole The patient is not neutropenic and does not exhibit a GI malabsorption state The patient is eating (either orally or via tube) and/or has been taking other orally administered medications for a least 24 hours The patient is improving clinically and has a Tmax < 100.5  If you have questions about this conversion, please contact the Pharmacy Department  []  ( [phone] )  [place] []  ( [phone] )  [facility] []  ( [phone]  [place] []  ( [phone] )  [facility] [x]  ( [phone] )  [facility]   

## 2018-04-15 NOTE — Progress Notes (Signed)
[facility] Progress Note   Taylor [name] [id number] May 25, 1955 63 y.o.  Taylor [name] is managed by Dr. Tyler Pita  Actively treated with chemotherapy/immunotherapy/hormonal therapy: yes  Current therapy: Radiation  Assessment: Plan:    Chronic obstructive pulmonary disease with acute exacerbation ([place]) - Plan: predniSONE (DELTASONE) 5 MG tablet, sulfamethoxazole-trimethoprim (BACTRIM DS,SEPTRA DS) 800-160 MG tablet, predniSONE (DELTASONE) tablet 20 mg  Prostate cancer (HCC)  Anxiety and depression  Chronic obstructive pulmonary disease with acute exacerbation: Patient was given a prescription for prednisone taper and Bactrim DS p.o. twice daily x7 days.  He was told to follow-up with his pulmonologist Dr. Melvyn Novas.  Prostate cancer.  He will continue to be treated with radiation under the care of Dr. Tyler Pita.  Anxiety and depression: Patient was instructed to contact the new primary care provider that he has information for but is yet to contact.  I have talked to him about the importance of having a regular primary care provider.  Please see After Visit Summary for patient specific instructions.  Future Appointments  Date Time Provider [place]  04/15/2018  3:00 PM [facility] LINAC 1 [facility] None  04/16/2018  3:00 PM [facility] LINAC 1 [facility] None  04/17/2018  3:00 PM [facility] LINAC 1 [facility] None  04/20/2018  3:00 PM [facility] LINAC 1 [facility] None  04/21/2018  3:00 PM [facility] LINAC 1 [facility] None  04/22/2018  3:00 PM [facility] LINAC 1 [facility] None  04/23/2018  3:00 PM [facility] LINAC 1 [facility] None  04/24/2018  3:00 PM [facility] LINAC 1 [facility] None  04/27/2018  3:00 PM [facility] LINAC 1 [facility] None  04/28/2018  3:00 PM [facility] LINAC 1 [facility] None  04/29/2018  3:00 PM [facility] LINAC 1 [facility] None  04/30/2018  3:00 PM [facility] LINAC 1 [facility] None  05/01/2018   3:00 PM [facility] LINAC 1 [facility] None  05/04/2018  3:00 PM [facility] LINAC 1 [facility] None  05/05/2018  3:00 PM [facility] LINAC 1 [facility] None  05/06/2018  3:00 PM [facility] LINAC 1 [facility] None  05/07/2018  3:00 PM [facility] LINAC 1 [facility] None  05/08/2018  3:00 PM [facility] LINAC 1 [facility] None  05/11/2018  3:00 PM [facility] LINAC 1 [facility] None  05/12/2018  3:00 PM [facility] LINAC 1 [facility] None  05/13/2018  3:00 PM [facility] LINAC 1 [facility] None  05/19/2018  3:00 PM [facility] LINAC 1 [facility] None  05/21/2018  3:00 PM [facility] LINAC 1 [facility] None  05/22/2018  3:00 PM [facility] LINAC 1 [facility] None  05/25/2018  3:00 PM [facility] LINAC 1 [facility] None  05/26/2018  3:00 PM [facility] LINAC 1 [facility] None  05/27/2018  3:00 PM [facility] LINAC 1 [facility] None    No orders of the defined types were placed in this encounter.      Subjective:   Patient ID:  Taylor [name] is a 63 y.o. (DOB Oct 27, 1955) male.  Chief Complaint:  Chief Complaint  Patient presents with   Shortness of Breath    HPI Taylor [name]   is a 63 year old male with a history of a prostate cancer who is managed by Dr. Tammi Klippel.  He presents today for radiation therapy.  He was seen after reporting and the waiting area that he was significantly shortness of breath with dyspnea on exertion over the last few days.  He has had multiple hospital emergency room encounters for COPD exacerbation.  He has been seen by Dr. Melvyn Novas who is his pulmonologist.  He has not followed up  with Dr. Rolland Porter.  He had previously been seen at the [place] but reports that they are no longer seeing him.  He had been given information regarding a new primary care provider but has yet to contact that individual.  He has anxiety and depression.  He also has bipolar disorder.  He is on multiple psychiatric medications but does not have follow-up with any provider.  He is on  supplemental oxygen via nasal cannula 2 L/min.  He reports today that he is worried about his COPD and wants something done about it.  Medications: I have reviewed the patient's current medications.  Allergies:  Allergies  Allergen Reactions   Codeine Nausea And Vomiting    Past Medical History:  Diagnosis Date   Acute on chronic respiratory failure (HCC)    Asthma    Bipolar 1 disorder ([place]) 02/05/2012   Colon cancer ([place]) 04/11/11   adenocarcinoma of colon, 7/19 nodes pos.FINISHED CHEMO/DR. SHERRILL   COPD (chronic obstructive pulmonary disease) ([place])    SMOKER   Depression    Dyspnea    Emphysema of lung ([place])    Full dentures    Hemorrhoids    On home oxygen therapy    "3L; 24/7" (05/29/2017)   Oxygen deficiency    Pneumonia ~ 2016   "double pneumonia"   Prostate cancer ([place])    Gleason score = 7, supposed to have radiation therapy but he has not followed up (05/29/2017)   Rib fractures    hx of    Past Surgical History:  Procedure Laterality Date   COLON SURGERY  04/11/11   Sigmoid colectomy   COLOSTOMY REVISION  04/11/2011   Procedure: COLON RESECTION SIGMOID;  Surgeon: [name], MD;  Location: [facility];  Service: General;  Laterality: N/A;  low anterior colon resection    GOLD SEED IMPLANT N/A 02/24/2018   Procedure: GOLD SEED IMPLANT;  Surgeon: [name], MD;  Location: [facility];  Service: Urology;  Laterality: N/A;   HERNIA REPAIR     INGUINAL HERNIA REPAIR Right 05/01/2012   Procedure: HERNIA REPAIR INGUINAL ADULT;  Surgeon: [name], MD;  Location: [facility];  Service: General;  Laterality: Right;   INSERTION OF MESH Right 05/01/2012   Procedure: INSERTION OF MESH;  Surgeon: [name], MD;  Location: [facility];  Service: General;  Laterality: Right;   PORT-A-CATH REMOVAL Left 05/01/2012   Procedure: REMOVAL Infusion Port;  Surgeon: [name], MD;  Location: [facility];  Service: General;  Laterality: Left;   PORTACATH PLACEMENT  05/02/2011     Procedure: INSERTION PORT-A-CATH;  Surgeon: [name], MD;  Location: [facility];  Service: General;  Laterality: N/A;   PROSTATE BIOPSY     SPACE OAR INSTILLATION N/A 02/24/2018   Procedure: SPACE OAR INSTILLATION;  Surgeon: [name], MD;  Location: [facility];  Service: Urology;  Laterality: N/A;    Family History  Problem Relation Age of Onset   Heart disease Father    Heart failure Mother    Heart disease Mother    Lung cancer Maternal Uncle        smoked    Social History   Socioeconomic History   Marital status: Divorced    Spouse name: Not on file   Number of children: 1   Years of education: Not on file   Highest education level: Not on file  Occupational History   Occupation: disability pending  Social Needs   Financial resource strain: Not  hard at all   Food insecurity:    Worry: Never true    Inability: Never true   Transportation needs:    Medical: Yes    Non-medical: Yes  Tobacco Use   Smoking status: Former Smoker    Packs/day: 0.10    Years: 46.00    Pack years: 4.60    Types: Cigarettes    Last attempt to quit: 05/21/2017    Years since quitting: 0.9   Smokeless tobacco: Former Systems developer    Types: Chew  Substance and Sexual Activity   Alcohol use: Not Currently    Comment: h/o use in the past, no h/o heavy use   Drug use: No   Sexual activity: Not Currently  Lifestyle   Physical activity:    Days per week: 0 days    Minutes per session: 0 min   Stress: Very much  Relationships   Social connections:    Talks on phone: More than three times a week    Gets together: Never    Attends religious service: Never    Active member of club or organization: No    Attends meetings of clubs or organizations: Never    Relationship status: Divorced   Intimate partner violence:    Fear of current or ex partner: No    Emotionally abused: No    Physically abused: No    Forced sexual activity: No  Other Topics Concern   Not on file   Social History Narrative   Lives alone-divorced, disabled, in [place] [address]'s   Brother, Aadit Hagood and his wife Debbie assist   Prior occupation: Clinical biochemist    Past Medical History, Surgical history, Social history, and Family history were reviewed and updated as appropriate.   Please see review of systems for further details on the patient's review from today.   Review of Systems:  Review of Systems  Constitutional: Negative for chills, diaphoresis, fatigue and fever.  HENT: Negative for congestion, postnasal drip, rhinorrhea and sore throat.   Respiratory: Positive for cough, shortness of breath and wheezing.   Cardiovascular: Negative for palpitations.  Neurological: Positive for tremors. Negative for headaches.  Psychiatric/Behavioral: Positive for dysphoric mood. The patient is nervous/anxious.     Objective:   Physical Exam:  BP (!) 160/67 (BP Location: Left Arm, Patient Position: Sitting)    Pulse (!) 107    Temp 98.6 F (37 C) (Oral)    Resp (!) 26    SpO2 100% Comment: 3L [place] ECOG: 1  Physical Exam Constitutional:      General: He is not in acute distress.    Appearance: He is not diaphoretic.  HENT:     Head: Normocephalic and atraumatic.     Right Ear: External ear normal.     Left Ear: External ear normal.     Mouth/Throat:     Pharynx: No oropharyngeal exudate.  Neck:     Musculoskeletal: Normal range of motion and neck supple.  Cardiovascular:     Rate and Rhythm: Normal rate and regular rhythm.     Heart sounds: Normal heart sounds. No murmur. No friction rub. No gallop.   Pulmonary:     Effort: Tachypnea present. No respiratory distress.     Breath sounds: Examination of the right-upper field reveals decreased breath sounds. Examination of the left-upper field reveals decreased breath sounds. Examination of the right-middle field reveals decreased breath sounds. Examination of the left-middle field reveals decreased breath sounds. Examination of the  right-lower field reveals  decreased breath sounds. Examination of the left-lower field reveals decreased breath sounds. Decreased breath sounds present. No wheezing or rales.     Comments: Decreased breath sounds throughout all lung fields.  The patient is receiving O2 via nasal cannula. Lymphadenopathy:     Cervical: No cervical adenopathy.  Skin:    General: Skin is warm and dry.     Findings: No erythema or rash.  Neurological:     Mental Status: He is alert.     Coordination: Coordination normal.     Comments: The patient is ambulating with the use of a wheelchair.  Psychiatric:        Behavior: Behavior normal.        Thought Content: Thought content normal.        Judgment: Judgment normal.     Lab Review:     Component Value Date/Time   NA 136 04/15/2018 0444   NA 136 05/12/2012 1018   K 4.3 04/15/2018 0444   K 4.6 05/12/2012 1018   CL 98 04/15/2018 0444   CL 100 05/12/2012 1018   CO2 31 04/15/2018 0444   CO2 27 05/12/2012 1018   GLUCOSE 210 (H) 04/15/2018 0444   GLUCOSE 107 (H) 05/12/2012 1018   BUN 13 04/15/2018 0444   BUN 11.1 05/12/2012 1018   CREATININE 0.62 04/15/2018 0444   CREATININE 1.0 05/12/2012 1018   CALCIUM 8.5 (L) 04/15/2018 0444   CALCIUM 9.0 05/12/2012 1018   PROT 6.2 (L) 04/15/2018 0444   PROT 6.9 10/22/2011 0946   ALBUMIN 3.9 04/15/2018 0444   ALBUMIN 3.8 10/22/2011 0946   AST 18 04/15/2018 0444   AST 38 (H) 10/22/2011 0946   ALT 18 04/15/2018 0444   ALT 29 10/22/2011 0946   ALKPHOS 49 04/15/2018 0444   ALKPHOS 100 10/22/2011 0946   BILITOT 0.5 04/15/2018 0444   BILITOT 0.80 10/22/2011 0946   GFRNONAA >60 04/15/2018 0444   GFRAA >60 04/15/2018 0444       Component Value Date/Time   WBC 2.6 (L) 04/15/2018 0444   RBC 3.51 (L) 04/15/2018 0444   HGB 11.0 (L) 04/15/2018 0444   HGB 15.5 11/13/2011 1328   HCT 33.7 (L) 04/15/2018 0444   HCT 45.4 11/13/2011 1328   PLT 183 04/15/2018 0444   PLT 122 (L) 11/13/2011 1328   MCV 96.0  04/15/2018 0444   MCV 103.8 (H) 11/13/2011 1328   MCH 31.3 04/15/2018 0444   MCHC 32.6 04/15/2018 0444   RDW 13.8 04/15/2018 0444   RDW 15.8 (H) 11/13/2011 1328   LYMPHSABS 0.5 (L) 04/14/2018 1717   LYMPHSABS 1.9 11/13/2011 1328   MONOABS 0.4 04/14/2018 1717   MONOABS 0.9 11/13/2011 1328   EOSABS 0.1 04/14/2018 1717   EOSABS 0.1 11/13/2011 1328   BASOSABS 0.0 04/14/2018 1717   BASOSABS 0.0 11/13/2011 1328   -------------------------------  Imaging from last 24 hours (if applicable):  Radiology interpretation: Dg Chest 2 View  Result Date: 04/03/2018 CLINICAL DATA:  63 year old male with increase shortness of breath. Chest pain. COPD on home oxygen. EXAM: CHEST - 2 VIEW COMPARISON:  03/26/2018 and earlier. FINDINGS: Semi upright AP and lateral views of the chest. Stable large lung volumes. Centrilobular emphysema demonstrated by CT last year. No pneumothorax, pulmonary edema, pleural effusion or confluent pulmonary opacity. Visualized tracheal air column is within normal limits. Normal cardiac size and mediastinal contours. No acute osseous abnormality identified. Negative visible bowel gas pattern. IMPRESSION: Emphysema (ICD10-J43.9). No acute cardiopulmonary abnormality. Electronically Signed   By:  Genevie Ann M.D.   On: 04/03/2018 17:42   Dg Chest 2 View  Result Date: 03/26/2018 CLINICAL DATA:  Sob /weaknessLegs cramping And shakingX smoker 3 yearsSymptoms 4 days and getting worseTaking radiation treatments for prostrate cancerHx of colon cancerUses O2 at home EXAM: CHEST - 2 VIEW COMPARISON:  03/07/2018 and prior exams. FINDINGS: Normal heart, mediastinum and hila. Lungs are hyperexpanded with relative lucency in the upper lobes consistent with emphysema. Lungs are clear. No pleural effusion or pneumothorax. Skeletal structures are demineralized. There old healed right rib fractures. IMPRESSION: 1. No acute cardiopulmonary disease. 2. COPD/emphysema. Electronically Signed   By: Lajean Manes  M.D.   On: 03/26/2018 15:27   Dg Chest Portable 1 View  Result Date: 04/14/2018 CLINICAL DATA:  Shortness of breath. EXAM: PORTABLE CHEST 1 VIEW COMPARISON:  Radiographs of April 03, 2018. FINDINGS: The heart size and mediastinal contours are within normal limits. Both lungs are clear. No pneumothorax or pleural effusion is noted. Old right rib fractures are noted. IMPRESSION: No acute cardiopulmonary abnormality seen. Emphysema (ICD10-J43.9). Electronically Signed   By: Marijo Conception, M.D.   On: 04/14/2018 17:30   Vas [place] Lower Extremity Venous (dvt) (only [facility])  Result Date: 03/26/2018  Lower Venous Study Indications: Edema.  Performing Technologist: Abram Sander RVS  Examination Guidelines: A complete evaluation includes B-mode imaging, spectral Doppler, color Doppler, and power Doppler as needed of all accessible portions of each vessel. Bilateral testing is considered an integral part of a complete examination. Limited examinations for reoccurring indications may be performed as noted.  Right Venous Findings: +---------+---------------+---------+-----------+----------+--------------+            Compressibility Phasicity Spontaneity Properties Summary         +---------+---------------+---------+-----------+----------+--------------+  CFV       Full            Yes       Yes                                    +---------+---------------+---------+-----------+----------+--------------+  SFJ       Full                                                             +---------+---------------+---------+-----------+----------+--------------+  FV Prox   Full                                                             +---------+---------------+---------+-----------+----------+--------------+  FV Mid    Full                                                             +---------+---------------+---------+-----------+----------+--------------+  FV Distal Full                                                              +---------+---------------+---------+-----------+----------+--------------+  PFV       Full                                                             +---------+---------------+---------+-----------+----------+--------------+  POP       Full            Yes       Yes                                    +---------+---------------+---------+-----------+----------+--------------+  PTV       Full                                                             +---------+---------------+---------+-----------+----------+--------------+  PERO                                                       Not visualized  +---------+---------------+---------+-----------+----------+--------------+  Left Venous Findings: +---------+---------------+---------+-----------+----------+--------------+            Compressibility Phasicity Spontaneity Properties Summary         +---------+---------------+---------+-----------+----------+--------------+  CFV       Full            Yes       Yes                                    +---------+---------------+---------+-----------+----------+--------------+  SFJ       Full                                                             +---------+---------------+---------+-----------+----------+--------------+  FV Prox   Full                                                             +---------+---------------+---------+-----------+----------+--------------+  FV Mid    Full                                                             +---------+---------------+---------+-----------+----------+--------------+  FV Distal Full                                                             +---------+---------------+---------+-----------+----------+--------------+  PFV       Full                                                             +---------+---------------+---------+-----------+----------+--------------+  POP       Full            Yes       Yes                                     +---------+---------------+---------+-----------+----------+--------------+  PTV       Full                                                             +---------+---------------+---------+-----------+----------+--------------+  PERO                                                       Not visualized  +---------+---------------+---------+-----------+----------+--------------+    Summary: Right: There is no evidence of deep vein thrombosis in the lower extremity. No cystic structure found in the popliteal fossa. Left: There is no evidence of deep vein thrombosis in the lower extremity. No cystic structure found in the popliteal fossa.  *See table(s) above for measurements and observations. Electronically signed by Servando Snare MD on 03/26/2018 at 5:51:16 PM.    Final

## 2018-04-15 NOTE — Evaluation (Signed)
Physical Therapy Evaluation Patient Details Name: Taylor [name] MRN: [id number] DOB: 02-01-1955 Today's Date: 04/15/2018   History of Present Illness  Pt is a 63 yo male admitted from cancer center for acute on chronic respiratory failure. Pt with multiple hospitalizations in 6 months for COPD. PMHx: COPD, emphysema on 3L continuous,  bipolar d/o, depression, prostate CA undergoing radiation, colon cancer 2013.  Clinical Impression   Pt presents with dyspnea on exertion, and decreased activity tolerance for ambulation due to fatigue and dyspnea. Pt to benefit from acute PT to address deficits. Pt ambulated hallway distance without AD with supervision assist, required one standing rest break to recover dyspnea. Pt's sats on 3LO2 via [place] during ambulation were 91-94%. PT educated pt on energy conservation at home to prevent severe dyspnea. PT to progress mobility as tolerated, continue to educate pt on energy conservation, and will continue to follow acutely.      Follow Up Recommendations No PT follow up    Equipment Recommendations  None recommended by PT    Recommendations for Other Services       Precautions / Restrictions Precautions Precautions: Fall(moderate fall risk ) Precaution Comments: 3L O2 continuous currently and at baseline Restrictions Weight Bearing Restrictions: No      Mobility  Bed Mobility Overal bed mobility: Independent                Transfers Overall transfer level: Independent Equipment used: None                Ambulation/Gait Ambulation/Gait assistance: Supervision Gait Distance (Feet): 300 Feet Assistive device: None Gait Pattern/deviations: WFL(Within Functional Limits) Gait velocity: decr    General Gait Details: supervision for safety. Required one standing rest break at 150 ft due to dyspnea 3/4. Pt proficient in pursed lip breathing technique to recover, verbal cuing to breathe in through nose and out through mouth. Sats  maintained at 91-94% on 3LO2 during ambulation.  Stairs            Wheelchair Mobility    Modified Rankin (Stroke Patients Only)       Balance Overall balance assessment: No apparent balance deficits (not formally assessed)                                           Pertinent Vitals/Pain Pain Assessment: No/denies pain    Home Living Family/patient expects to be discharged to:: Private residence Living Arrangements: Non-relatives/Friends Available Help at Discharge: Friend(s);Available PRN/intermittently Type of Home: House Home Access: Stairs to enter Entrance Stairs-Rails: Right Entrance Stairs-Number of Steps: 3 Home Layout: One level Home Equipment: Shower seat - built in      Prior Function Level of Independence: Independent         Comments: limited in ambulation and ADLs due to dyspnea on exertion. Pt reports friend helps him get oxygen together, per previous note friend that lives with him also does grocery shopping.     Hand Dominance   Dominant Hand: Right    Extremity/Trunk Assessment   Upper Extremity Assessment Upper Extremity Assessment: Overall WFL for tasks assessed    Lower Extremity Assessment Lower Extremity Assessment: Overall WFL for tasks assessed    Cervical / Trunk Assessment Cervical / Trunk Assessment: Normal  Communication   Communication: No difficulties  Cognition Arousal/Alertness: Awake/alert Behavior During Therapy: WFL for tasks assessed/performed Overall Cognitive Status: Within Functional Limits  for tasks assessed                                        General Comments      Exercises     Assessment/Plan    PT Assessment Patient needs continued PT services  PT Problem List Decreased activity tolerance;Decreased strength;Decreased mobility       PT Treatment Interventions Functional mobility training;Balance training;Patient/family education;Gait training;Therapeutic  activities;Therapeutic exercise;Stair training    PT Goals (Current goals can be found in the Care Plan section)  Acute Rehab PT Goals Patient Stated Goal: breathe better PT Goal Formulation: With patient Time For Goal Achievement: 04/29/18 Potential to Achieve Goals: Good    Frequency Min 3X/week   Barriers to discharge        Co-evaluation               AM-PAC PT "6 Clicks" Mobility  Outcome Measure Help needed turning from your back to your side while in a flat bed without using bedrails?: None Help needed moving from lying on your back to sitting on the side of a flat bed without using bedrails?: None Help needed moving to and from a bed to a chair (including a wheelchair)?: None Help needed standing up from a chair using your arms (e.g., wheelchair or bedside chair)?: A Little Help needed to walk in hospital room?: A Little Help needed climbing 3-5 steps with a railing? : A Little 6 Click Score: 21    End of Session Equipment Utilized During Treatment: Oxygen Activity Tolerance: Patient tolerated treatment well;Treatment limited secondary to medical complications (Comment) Patient left: with call bell/phone within reach;with bed alarm set;in bed(bed alarm set on PT arrival to room, re-set it upon PT exit) Nurse Communication: Mobility status PT Visit Diagnosis: Other abnormalities of gait and mobility (R26.89)    Time: 1550-1610 PT Time Calculation (min) (ACUTE ONLY): 20 min   Charges:   PT Evaluation $PT Eval Low Complexity: 1 Low        Julien Girt, PT Acute Rehabilitation Services Pager [phone]  Office [phone]  Chikita Dogan D Elonda Husky 04/15/2018, 5:10 PM

## 2018-04-15 NOTE — Progress Notes (Signed)
Spoke with Museum/gallery conservator, RN on [place]. She reports the patient is stable enough to receive his prostate radiation today. Informed RT, [place], of this finding. Radiation transporters will present around 1445 to bring patient down for 1500 treatment.

## 2018-04-16 ENCOUNTER — Ambulatory Visit
Admission: RE | Admit: 2018-04-16 | Discharge: 2018-04-16 | Disposition: A | Discharge status: Home / Self Care | Payer: Medicaid Other | Source: Ambulatory Visit | Attending: Radiation Oncology | Admitting: Radiation Oncology

## 2018-04-16 LAB — GLUCOSE, CAPILLARY
Glucose-Capillary: 155 mg/dL — ABNORMAL HIGH (ref 70–99)
Glucose-Capillary: 301 mg/dL — ABNORMAL HIGH (ref 70–99)

## 2018-04-16 MED ORDER — ALBUTEROL SULFATE (2.5 MG/3ML) 0.083% IN NEBU: 2.5000 mg | INHALATION_SOLUTION | Freq: Two times a day (BID) | RESPIRATORY_TRACT | Status: DC

## 2018-04-16 NOTE — Discharge Instructions (Signed)
Asthma Attack    Acute bronchospasm caused by asthma is also referred to as an asthma attack. Bronchospasm means that the air passages become narrowed or "tight," which limits the amount of oxygen that can get into the lungs. The narrowing is caused by inflammation and tightening of the muscles in the air tubes (bronchi) in the lungs. Excessive mucus is also produced, which narrows the airways more. This can cause trouble breathing, coughing, and loud breathing (wheezing).  What are the causes?  Possible triggers include:  · Animal dander from the skin, hair, or feathers of animals.  · Dust mites contained in house dust.  · Cockroaches.  · Pollen from trees or grass.  · Mold.  · Cigarette or tobacco smoke.  · Air pollutants such as dust, household cleaners, hair sprays, aerosol sprays, paint fumes, strong chemicals, or strong odors.  · Cold air or weather changes. Cold air may trigger inflammation. Winds increase molds and pollens in the air.  · Strong emotions such as crying or laughing hard.  · Stress.  · Certain medicines, such as aspirin or beta-blockers.  · Sulfites in foods and drinks, such as dried fruits and wine.  · Infections or inflammatory conditions, such as a flu, a cold, pneumonia, or inflammation of the nasal membranes (rhinitis).  · Gastroesophageal reflux disease (GERD). GERD is a condition in which stomach acid backs up into your esophagus, which can irritate nearby airway structures.  · Exercise or activity that requires a lot of energy.  What are the signs or symptoms?  Symptoms of this condition include:  · Wheezing. This may sound like whistling while breathing. This may be more noticeable at night.  · Excessive coughing, particularly at night.  · Chest tightness or pain.  · Shortness of breath.  · Feeling like you cannot get enough air no matter how hard you try (air hunger).  How is this diagnosed?  This condition may be diagnosed based on:  · Your medical history.  · Your symptoms.  · A  physical exam.  · Tests to check for other causes of your symptoms or other conditions that may have triggered your asthma attack. These tests may include:  ? Chest X-ray.  ? Blood tests.  ? Specialized tests to assess lung function, such as breathing into a device that measures how much air you inhale and exhale (spirometry).  How is this treated?  The goal of treatment is to open the airways in your lungs and reduce inflammation. Most asthma attacks are treated with medicines that you inhale through a hand-held inhaler (metered dose inhaler, MDI) or a device that turns liquid medicine into a mist that you inhale (nebulizer). Medicines may include:  · Quick relief or rescue medicines that relax the muscles of the bronchi. These medicines include bronchodilators, such as albuterol.  · Controller medicines, such as inhaled corticosteroids. These are long-acting medicines that are used for daily asthma maintenance.  If you have a moderate or severe asthma attack, you may be treated with steroid medicines by mouth or through an IV injection at the hospital. Steroid medicines reduce inflammation in your lungs. Depending on the severity of your attack, you may need oxygen therapy to help you breathe.  If your asthma attack was caused by a bacterial infection, such as pneumonia, you will be given antibiotic medicines.  Follow these instructions at home:  Medicines  · Take over-the-counter and prescription medicines only as told by your health care provider. Keep your medicines   up-to-date and available.  · If you are more than [redacted] weeks pregnant and you are prescribed any new medicines, tell your obstetrician about those medicines.  · If you were prescribed an antibiotic medicine, take it as told by your health care provider. Do not stop taking the antibiotic even if you start to feel better.  Avoiding triggers    · Keep track of things that trigger your asthma attacks or cause you to have breathing problems, and avoid  exposure to these triggers.  · Do not use any products that contain nicotine or tobacco, such as cigarettes and e-cigarettes. If you need help quitting, ask your health care provider.  · Avoid secondhand smoke.  · Avoid strong smells, such as perfumes, aerosols, and cleaning solvents.  · When pollen or air pollution is bad, keep windows closed and use an air conditioner or go to places with air conditioning.  Asthma action plan  · Work with your health care provider to make a written plan for managing and treating your asthma attacks (asthma action plan). This plan should include:  ? A list of your asthma triggers and how to avoid them.  ? Information about when your medicines should be taken and when their dosage should be changed.  ? Instructions about using a device called a peak flow meter to monitor your condition. A peak flow meter measures how well your lungs are working and measures how severe your asthma is at a given time. Your "personal best" is the highest peak flow rate you can reach when you feel good and have no asthma symptoms.  General instructions  · Avoid excessive exercise or activity until your asthma attack resolves. Ask your health care provider what activities are safe for you and when you can return to your normal activities.  · Stay up to date on all vaccinations recommended by your health care provider, such as flu and pneumonia vaccines.  · Drink enough fluid to keep your urine clear or pale yellow. Staying hydrated helps keep mucus in your lungs thin so it can be coughed up easily.  · If you drink caffeine, do so in moderation.  · Do not use alcohol until you have recovered.  · Keep all follow-up visits as told by your health care provider. This is important. Asthma requires careful medical care, and you and your health care provider can work together to reduce the likelihood of future attacks.  Contact a health care provider if:  · Your peak flow reading is still at 50-79% of your  personal best after you have followed your action plan for 1 hour. This is in the yellow zone, which means "caution."  · You need to use a reliever medicine more than 2-3 times a week.  · Your medicines are causing side effects, such as:  ? Rash.  ? Itching.  ? Swelling.  ? Trouble breathing.  · Your symptoms do not improve after 48 hours.  · You cough up mucus (sputum) that is thicker than usual.  · You have a fever.  · You need to use your medicines much more frequently than normal.  Get help right away if:  · Your peak flow reading is less than 50% of your personal best. This is in the red zone, which means "danger."  · You have severe trouble breathing.  · You develop chest pain or discomfort.  · Your medicines no longer seem to be helping.  · You vomit.  · You cannot   eat or drink without vomiting.  · You are coughing up yellow, green, brown, or bloody mucus.  · You have a fever and your symptoms suddenly get worse.  · You have trouble swallowing.  · You feel very tired, and breathing becomes tiring.  Summary  · Acute bronchospasm caused by asthma is also referred to as an asthma attack.  · Bronchospasm is caused by narrowing or tightness in air passages, which causes shortness of breath, coughing, and loud breathing (wheezing).  · Many things can trigger an asthma attack, such as allergens, weather changes, exercise, smoke, and other fumes.  · Treatment for an asthma attack may include inhaled rescue medicines for immediate relief, as well as the use of maintenance therapy.  · Get help right away if you have worsening shortness of breath, chest pain, or fever, or if your home medicines are no longer helping with your symptoms.  This information is not intended to replace advice given to you by your health care provider. Make sure you discuss any questions you have with your health care provider.  Document Released: 04/24/2006 Document Revised: 02/09/2016 Document Reviewed: 02/09/2016  Elsevier Interactive  Patient Education © 2019 [employer]

## 2018-04-16 NOTE — Progress Notes (Signed)
Pt escorted to radiation oncology for treatment.

## 2018-04-16 NOTE — Plan of Care (Signed)
Pt alert and oriented, resting with no complaints this am.  Some SOB with exertion, but feels better today.  Plan for radiation today, and d/c home per MD order. RN will monitor.

## 2018-04-16 NOTE — Progress Notes (Signed)
Discharge paperwork discussed with pt at the bedside.  Pt to escorted for radiation tx at 1430.  Pt to discharge from there.  Taxi voucher placed on chart.
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
## 2018-04-16 NOTE — Progress Notes (Signed)
SATURATION QUALIFICATIONS: (This note is used to comply with regulatory documentation for home oxygen)  Patient Saturations on Room Air at Rest = 93%  Patient Saturations on Room Air while Ambulating = 88%  Patient Saturations on 3 Liters of oxygen while Ambulating = 91%  Please briefly explain why patient needs home oxygen: Pt uses 3L O2 @ baseline, pt desats when on RA while ambulating. Sats back up to 93% 3L O2 when resting.

## 2018-04-16 NOTE — Discharge Summary (Signed)
Physician Discharge Summary  Taylor [name] TFT:[id number] DOB: 02-01-1955 DOA: 04/14/2018  PCP: Clent Demark, PA-C  Admit date: 04/14/2018 Discharge date: 04/16/2018  Admitted From: Home Disposition: Home  Recommendations for Outpatient Follow-up:  1. Follow up with PCP in 1-2 weeks 2. Please obtain BMP/CBC in one week   Home Health: None Equipment/Devices: 3 L nasal cannula  Discharge Condition: Stable CODE STATUS: Full Diet recommendation: Heart Healthy   Brief/Interim Summary:  #) Acute on chronic severe asthma/COPD exacerbation: Patient was admitted with acute on chronic shortness of breath and COPD exacerbation.  Patient was given scheduled short-acting bronchodilators and IV methylprednisolone.  Antibiotics were held as there is no evidence of pneumonia on chest x-ray.  Patient was continued on home LABA/ICS/LABA.  Patient has prednisone and oral antibiotics prescribed as an outpatient which he has not picked up yet.  He would take these upon discharge.  #) Type 2 diabetes: Patient was maintained on sliding scale insulin here.  His home metformin was held but may be restarted on discharge.  #) Prostate cancer/BPH: Patient was continued on home tamsulosin.  #) Hypertension/hyperlipidemia: Patient was continued on home statin.  #) Pain/psych: Patient was continued on home aripiprazole and escitalopram.  Discharge Diagnoses:  Principal Problem:   COPD with acute exacerbation ([place]) Active Problems:   Bipolar 1 disorder ([facility])   COPD exacerbation ([facility])   Prostate cancer ([place])   Type 2 diabetes mellitus ([place])   Essential hypertension   Chronic respiratory failure with hypoxia [facility])    Discharge Instructions  Discharge Instructions    Call MD for:  difficulty breathing, headache or visual disturbances   Complete by:  As directed    Call MD for:  extreme fatigue   Complete by:  As directed    Call MD for:  hives   Complete by:  As directed    Call MD for:   persistant dizziness or light-headedness   Complete by:  As directed    Call MD for:  persistant nausea and vomiting   Complete by:  As directed    Call MD for:  redness, tenderness, or signs of infection (pain, swelling, redness, odor or green/yellow discharge around incision site)   Complete by:  As directed    Call MD for:  severe uncontrolled pain   Complete by:  As directed    Call MD for:  temperature >100.4   Complete by:  As directed    Diet - low sodium heart healthy   Complete by:  As directed    Discharge instructions   Complete by:  As directed    Please pick up your steroids and antibiotics as prescribed.   Increase activity slowly   Complete by:  As directed      Allergies as of 04/16/2018      Reactions   Codeine Nausea And Vomiting      Medication List    TAKE these medications   Accu-Chek Aviva device Use as instructed   accu-chek soft touch lancets 1 each by Other route 2 (two) times daily. Use as instructed   albuterol (2.5 MG/3ML) 0.083% nebulizer solution Commonly known as:  PROVENTIL Take 3 mLs (2.5 mg total) by nebulization every 4 (four) hours as needed for wheezing or shortness of breath (if you can't catch your breath).   ARIPiprazole 5 MG tablet Commonly known as:  ABILIFY Take 5 mg by mouth daily.   escitalopram 20 MG tablet Commonly known as:  LEXAPRO Take 20 mg  by mouth daily.   glucose blood test strip Commonly known as:  Accu-Chek Aviva 1 each by Other route as needed for other. Use as instructed   loperamide 2 MG capsule Commonly known as:  IMODIUM Take 2 every 6 hours if continued diarrhea   lovastatin 20 MG tablet Commonly known as:  MEVACOR Take 1 tablet (20 mg total) by mouth at bedtime.   metFORMIN 1000 MG tablet Commonly known as:  GLUCOPHAGE Take 1 tablet (1,000 mg total) by mouth 2 (two) times daily with a meal.   mometasone-formoterol 100-5 MCG/ACT Aero Commonly known as:  DULERA Inhale 2 puffs into the lungs 2  (two) times daily.   OXYGEN Inhale 3 L into the lungs continuous.   predniSONE 5 MG tablet Commonly known as:  DELTASONE 6 tab x 1 day, 5 tab x 1 day, 4 tab x 1 day, 3 tab x 1 day, 2 tab x 1 day, 1 tab x 1 day, stop   sulfamethoxazole-trimethoprim 800-160 MG tablet Commonly known as:  BACTRIM DS,SEPTRA DS Take 1 tablet by mouth 2 (two) times daily.   tamsulosin 0.4 MG Caps capsule Commonly known as:  FLOMAX Take 1 capsule (0.4 mg total) by mouth daily.   tiotropium 18 MCG inhalation capsule Commonly known as:  SPIRIVA Place 1 capsule (18 mcg total) into inhaler and inhale daily.       Allergies  Allergen Reactions  . Codeine Nausea And Vomiting    Consultations:  None   Procedures/Studies: Dg Chest 2 View  Result Date: 04/03/2018 CLINICAL DATA:  63 year old male with increase shortness of breath. Chest pain. COPD on home oxygen. EXAM: CHEST - 2 VIEW COMPARISON:  03/26/2018 and earlier. FINDINGS: Semi upright AP and lateral views of the chest. Stable large lung volumes. Centrilobular emphysema demonstrated by CT last year. No pneumothorax, pulmonary edema, pleural effusion or confluent pulmonary opacity. Visualized tracheal air column is within normal limits. Normal cardiac size and mediastinal contours. No acute osseous abnormality identified. Negative visible bowel gas pattern. IMPRESSION: Emphysema (ICD10-J43.9). No acute cardiopulmonary abnormality. Electronically Signed   By: Genevie Ann M.D.   On: 04/03/2018 17:42   Dg Chest 2 View  Result Date: 03/26/2018 CLINICAL DATA:  Sob /weaknessLegs cramping And shakingX smoker 3 yearsSymptoms 4 days and getting worseTaking radiation treatments for prostrate cancerHx of colon cancerUses O2 at home EXAM: CHEST - 2 VIEW COMPARISON:  03/07/2018 and prior exams. FINDINGS: Normal heart, mediastinum and hila. Lungs are hyperexpanded with relative lucency in the upper lobes consistent with emphysema. Lungs are clear. No pleural effusion or  pneumothorax. Skeletal structures are demineralized. There old healed right rib fractures. IMPRESSION: 1. No acute cardiopulmonary disease. 2. COPD/emphysema. Electronically Signed   By: Lajean Manes M.D.   On: 03/26/2018 15:27   Dg Chest Portable 1 View  Result Date: 04/14/2018 CLINICAL DATA:  Shortness of breath. EXAM: PORTABLE CHEST 1 VIEW COMPARISON:  Radiographs of April 03, 2018. FINDINGS: The heart size and mediastinal contours are within normal limits. Both lungs are clear. No pneumothorax or pleural effusion is noted. Old right rib fractures are noted. IMPRESSION: No acute cardiopulmonary abnormality seen. Emphysema (ICD10-J43.9). Electronically Signed   By: Marijo Conception, M.D.   On: 04/14/2018 17:30   Vas [place] Lower Extremity Venous (dvt) (only [facility])  Result Date: 03/26/2018  Lower Venous Study Indications: Edema.  Performing Technologist: Abram Sander RVS  Examination Guidelines: A complete evaluation includes B-mode imaging, spectral Doppler, color Doppler, and power Doppler  as needed of all accessible portions of each vessel. Bilateral testing is considered an integral part of a complete examination. Limited examinations for reoccurring indications may be performed as noted.  Right Venous Findings: +---------+---------------+---------+-----------+----------+--------------+          CompressibilityPhasicitySpontaneityPropertiesSummary        +---------+---------------+---------+-----------+----------+--------------+ CFV      Full           Yes      Yes                                 +---------+---------------+---------+-----------+----------+--------------+ SFJ      Full                                                        +---------+---------------+---------+-----------+----------+--------------+ FV Prox  Full                                                        +---------+---------------+---------+-----------+----------+--------------+ FV Mid   Full                                                         +---------+---------------+---------+-----------+----------+--------------+ FV DistalFull                                                        +---------+---------------+---------+-----------+----------+--------------+ PFV      Full                                                        +---------+---------------+---------+-----------+----------+--------------+ POP      Full           Yes      Yes                                 +---------+---------------+---------+-----------+----------+--------------+ PTV      Full                                                        +---------+---------------+---------+-----------+----------+--------------+ PERO                                                  Not visualized +---------+---------------+---------+-----------+----------+--------------+  Left Venous Findings: +---------+---------------+---------+-----------+----------+--------------+  CompressibilityPhasicitySpontaneityPropertiesSummary        +---------+---------------+---------+-----------+----------+--------------+ CFV      Full           Yes      Yes                                 +---------+---------------+---------+-----------+----------+--------------+ SFJ      Full                                                        +---------+---------------+---------+-----------+----------+--------------+ FV Prox  Full                                                        +---------+---------------+---------+-----------+----------+--------------+ FV Mid   Full                                                        +---------+---------------+---------+-----------+----------+--------------+ FV DistalFull                                                        +---------+---------------+---------+-----------+----------+--------------+ PFV      Full                                                         +---------+---------------+---------+-----------+----------+--------------+ POP      Full           Yes      Yes                                 +---------+---------------+---------+-----------+----------+--------------+ PTV      Full                                                        +---------+---------------+---------+-----------+----------+--------------+ PERO                                                  Not visualized +---------+---------------+---------+-----------+----------+--------------+    Summary: Right: There is no evidence of deep vein thrombosis in the lower extremity. No cystic structure found in the popliteal fossa. Left: There is no evidence of deep vein thrombosis in the lower extremity. No cystic structure found in the popliteal fossa.  *See table(s) above for measurements and observations. Electronically  signed by Servando Snare MD on 03/26/2018 at 5:51:16 PM.    Final      Subjective:   Discharge Exam: Vitals:   04/16/18 0549 04/16/18 0811  BP:    Pulse:    Resp:    Temp:    SpO2: 97% 95%   Vitals:   04/15/18 2210 04/16/18 0522 04/16/18 0549 04/16/18 0811  BP: 133/84 (!) 142/84    Pulse: (!) 118 81    Resp: 20 16    Temp: 98.3 F (36.8 C) 98.4 F (36.9 C)    TempSrc: Oral Oral    SpO2: 96% 97% 97% 95%  Weight:      Height:         General exam: Appears calm and comfortable  Respiratory system:  No increased work of breathing, minimal wheezing, no rhonchi, no crackles Cardiovascular system: Regular rate and rhythm, no murmurs Gastrointestinal system: Soft, nondistended, no rebound or guarding, plus bowel sounds Central nervous system: Alert and oriented.  Grossly intact, moving all extremities Extremities: No lower extremity edema Skin: No rashes over visible skin Psychiatry: Judgement and insight appear normal. Mood & affect appropriate.      The results of significant diagnostics from this  hospitalization (including imaging, microbiology, ancillary and laboratory) are listed below for reference.     Microbiology: No results found for this or any previous visit (from the past 240 hour(s)).   Labs: BNP (last 3 results) Recent Labs    01/17/18 1454 03/26/18 1425 04/14/18 1718  BNP 26.3 24.2 77.8   Basic Metabolic Panel: Recent Labs  Lab 04/10/18 1428 04/14/18 1717 04/15/18 0444  NA 138 138 136  K 4.0 4.5 4.3  CL 98 96* 98  CO2 31 33* 31  GLUCOSE 98 108* 210*  BUN 10 13 13   CREATININE 0.70 0.77 0.62  CALCIUM 8.9 8.9 8.5*   Liver Function Tests: Recent Labs  Lab 04/15/18 0444  AST 18  ALT 18  ALKPHOS 49  BILITOT 0.5  PROT 6.2*  ALBUMIN 3.9   No results for input(s): LIPASE, AMYLASE in the last 168 hours. No results for input(s): AMMONIA in the last 168 hours. CBC: Recent Labs  Lab 04/10/18 1428 04/14/18 1717 04/15/18 0444  WBC 5.4 5.1 2.6*  NEUTROABS  --  4.1  --   HGB 11.5* 12.4* 11.0*  HCT 35.4* 38.8* 33.7*  MCV 95.9 98.5 96.0  PLT 179 236 183   Cardiac Enzymes: Recent Labs  Lab 04/14/18 1717  TROPONINI <0.03   BNP: Invalid input(s): POCBNP CBG: Recent Labs  Lab 04/10/18 1408 04/15/18 1620 04/15/18 2131 04/16/18 0753  GLUCAP 93 232* 201* 155*   D-Dimer No results for input(s): DDIMER in the last 72 hours. Hgb A1c No results for input(s): HGBA1C in the last 72 hours. Lipid Profile No results for input(s): CHOL, HDL, LDLCALC, TRIG, CHOLHDL, LDLDIRECT in the last 72 hours. Thyroid function studies No results for input(s): TSH, T4TOTAL, T3FREE, THYROIDAB in the last 72 hours.  Invalid input(s): FREET3 Anemia work up No results for input(s): VITAMINB12, FOLATE, FERRITIN, TIBC, IRON, RETICCTPCT in the last 72 hours. Urinalysis    Component Value Date/Time   COLORURINE YELLOW 01/17/2018 1731   APPEARANCEUR CLEAR 01/17/2018 1731   LABSPEC 1.036 (H) 01/17/2018 1731   PHURINE 5.0 01/17/2018 1731   GLUCOSEU >=500 (A)  01/17/2018 1731   HGBUR NEGATIVE 01/17/2018 1731   BILIRUBINUR NEGATIVE 01/17/2018 1731   KETONESUR 5 (A) 01/17/2018 1731   PROTEINUR NEGATIVE 01/17/2018 1731  UROBILINOGEN 1.0 01/09/2013 1843   NITRITE NEGATIVE 01/17/2018 1731   LEUKOCYTESUR NEGATIVE 01/17/2018 1731   Sepsis Labs Invalid input(s): PROCALCITONIN,  WBC,  LACTICIDVEN Microbiology No results found for this or any previous visit (from the past 240 hour(s)).   Time coordinating discharge: 35  SIGNED:   Cristy Folks, MD  [facility] 04/16/2018, 9:29 AM  If 7PM-7AM, please contact night-coverage [email or web address] Password [email or web address]

## 2018-04-17 ENCOUNTER — Ambulatory Visit: Payer: Medicaid Other

## 2018-04-20 ENCOUNTER — Ambulatory Visit: Payer: Medicaid Other

## 2018-04-21 ENCOUNTER — Ambulatory Visit: Payer: Medicaid Other

## 2018-04-22 ENCOUNTER — Ambulatory Visit: Payer: Medicaid Other

## 2018-04-23 ENCOUNTER — Emergency Department (HOSPITAL_COMMUNITY): Payer: Medicaid Other

## 2018-04-23 ENCOUNTER — Emergency Department (HOSPITAL_COMMUNITY)
Admission: EM | Admit: 2018-04-23 | Discharge: 2018-04-23 | Disposition: A | Discharge status: Home / Self Care | Payer: Medicaid Other | Attending: Emergency Medicine | Admitting: Emergency Medicine

## 2018-04-23 ENCOUNTER — Ambulatory Visit: Payer: Medicaid Other

## 2018-04-23 DIAGNOSIS — Z7984 Long term (current) use of oral hypoglycemic drugs: Secondary | ICD-10-CM | POA: Insufficient documentation

## 2018-04-23 DIAGNOSIS — Z85038 Personal history of other malignant neoplasm of large intestine: Secondary | ICD-10-CM | POA: Diagnosis not present

## 2018-04-23 DIAGNOSIS — J439 Emphysema, unspecified: Secondary | ICD-10-CM | POA: Insufficient documentation

## 2018-04-23 DIAGNOSIS — Z9981 Dependence on supplemental oxygen: Secondary | ICD-10-CM | POA: Insufficient documentation

## 2018-04-23 DIAGNOSIS — J441 Chronic obstructive pulmonary disease with (acute) exacerbation: Secondary | ICD-10-CM | POA: Diagnosis not present

## 2018-04-23 DIAGNOSIS — Z8546 Personal history of malignant neoplasm of prostate: Secondary | ICD-10-CM | POA: Diagnosis not present

## 2018-04-23 DIAGNOSIS — Z79899 Other long term (current) drug therapy: Secondary | ICD-10-CM | POA: Diagnosis not present

## 2018-04-23 DIAGNOSIS — J45909 Unspecified asthma, uncomplicated: Secondary | ICD-10-CM | POA: Insufficient documentation

## 2018-04-23 DIAGNOSIS — Z87891 Personal history of nicotine dependence: Secondary | ICD-10-CM | POA: Diagnosis not present

## 2018-04-23 DIAGNOSIS — R0602 Shortness of breath: Secondary | ICD-10-CM | POA: Diagnosis present

## 2018-04-23 LAB — CBC WITH DIFFERENTIAL/PLATELET
Abs Immature Granulocytes: 0.11 10*3/uL — ABNORMAL HIGH (ref 0.00–0.07)
Basophils Absolute: 0 10*3/uL (ref 0.0–0.1)
Basophils Relative: 0 %
Eosinophils Absolute: 0 10*3/uL (ref 0.0–0.5)
Eosinophils Relative: 0 %
HCT: 35.1 % — ABNORMAL LOW (ref 39.0–52.0)
Hemoglobin: 11.6 g/dL — ABNORMAL LOW (ref 13.0–17.0)
Immature Granulocytes: 2 %
Lymphocytes Relative: 3 %
Lymphs Abs: 0.2 10*3/uL — ABNORMAL LOW (ref 0.7–4.0)
MCH: 31.9 pg (ref 26.0–34.0)
MCHC: 33 g/dL (ref 30.0–36.0)
MCV: 96.4 fL (ref 80.0–100.0)
Monocytes Absolute: 0.1 10*3/uL (ref 0.1–1.0)
Monocytes Relative: 2 %
Neutro Abs: 4.6 10*3/uL (ref 1.7–7.7)
Neutrophils Relative %: 93 %
Platelets: 205 10*3/uL (ref 150–400)
RBC: 3.64 MIL/uL — ABNORMAL LOW (ref 4.22–5.81)
RDW: 14.1 % (ref 11.5–15.5)
WBC: 5.1 10*3/uL (ref 4.0–10.5)
nRBC: 0 % (ref 0.0–0.2)

## 2018-04-23 LAB — BASIC METABOLIC PANEL
Anion gap: 7 (ref 5–15)
BUN: 14 mg/dL (ref 8–23)
CO2: 35 mmol/L — ABNORMAL HIGH (ref 22–32)
Calcium: 8.2 mg/dL — ABNORMAL LOW (ref 8.9–10.3)
Chloride: 90 mmol/L — ABNORMAL LOW (ref 98–111)
Creatinine, Ser: 0.65 mg/dL (ref 0.61–1.24)
GFR calc Af Amer: >60 mL/min (ref >60)
GFR calc non Af Amer: >60 mL/min (ref >60)
Glucose, Bld: 125 mg/dL — ABNORMAL HIGH (ref 70–99)
Potassium: 4.7 mmol/L (ref 3.5–5.1)
Sodium: 132 mmol/L — ABNORMAL LOW (ref 135–145)

## 2018-04-23 MED ORDER — ACETAMINOPHEN 500 MG PO TABS
1000.0000 mg | ORAL_TABLET | Freq: Once | ORAL | Status: AC
  Administered 2018-04-23: 1000 mg via ORAL
  Filled 2018-04-23: qty 2

## 2018-04-23 MED ORDER — DEXAMETHASONE SODIUM PHOSPHATE 10 MG/ML IJ SOLN
10.0000 mg | Freq: Once | INTRAMUSCULAR | Status: AC
  Administered 2018-04-23: 10 mg via INTRAVENOUS
  Filled 2018-04-23: qty 1

## 2018-04-23 MED ORDER — ALBUTEROL SULFATE HFA 108 (90 BASE) MCG/ACT IN AERS
8.0000 | INHALATION_SPRAY | Freq: Once | RESPIRATORY_TRACT | Status: AC
  Administered 2018-04-23: 8 via RESPIRATORY_TRACT
  Filled 2018-04-23: qty 6.7

## 2018-04-23 MED ORDER — SODIUM CHLORIDE 0.9 % IV BOLUS
500.0000 mL | Freq: Once | INTRAVENOUS | Status: AC
  Administered 2018-04-23: 500 mL via INTRAVENOUS

## 2018-04-23 MED ORDER — MOMETASONE FURO-FORMOTEROL FUM 100-5 MCG/ACT IN AERO: 2.0000 | INHALATION_SPRAY | Freq: Two times a day (BID) | RESPIRATORY_TRACT | 1 refills | Status: DC

## 2018-04-23 MED ORDER — ALBUTEROL SULFATE HFA 108 (90 BASE) MCG/ACT IN AERS
10.0000 | INHALATION_SPRAY | Freq: Once | RESPIRATORY_TRACT | Status: AC
  Administered 2018-04-23: 10 via RESPIRATORY_TRACT
  Filled 2018-04-23: qty 6.7

## 2018-04-23 MED ORDER — MAGNESIUM SULFATE 2 GM/50ML IV SOLN
2.0000 g | INTRAVENOUS | Status: AC
  Administered 2018-04-23: 2 g via INTRAVENOUS
  Filled 2018-04-23: qty 50

## 2018-04-23 MED ORDER — ALBUTEROL SULFATE (2.5 MG/3ML) 0.083% IN NEBU: 2.5000 mg | INHALATION_SOLUTION | RESPIRATORY_TRACT | 1 refills | Status: DC | PRN

## 2018-04-23 MED ORDER — IPRATROPIUM-ALBUTEROL 20-100 MCG/ACT IN AERS
2.0000 | INHALATION_SPRAY | Freq: Once | RESPIRATORY_TRACT | Status: AC
  Administered 2018-04-23: 2 via RESPIRATORY_TRACT
  Filled 2018-04-23: qty 4

## 2018-04-23 NOTE — Discharge Instructions (Signed)
Get help right away if: You have shortness of breath while you are resting. You have shortness of breath that prevents you from: Being able to talk. Performing your usual physical activities. You have chest pain lasting longer than 5 minutes. Your skin color is more blue (cyanotic) than usual. You measure low oxygen saturations for longer than 5 minutes with a pulse oximeter. You have a fever. You feel too tired to breathe normally. 

## 2018-04-23 NOTE — ED Notes (Signed)
Bed: WA17 Expected date:  Expected time:  Means of arrival:  Comments: EMS COPD
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
## 2018-04-23 NOTE — ED Triage Notes (Signed)
Transported by [facility] from home-- recently discharged approximately 1 week ago for COPD exacerbation. +cough, wheezing, SOB with minimal relief with albuterol inhaler. Was prescribed prednisone upon discharge and did not get it filled until yesterday. +Oxygen dependent (3L)

## 2018-04-24 ENCOUNTER — Ambulatory Visit: Payer: Medicaid Other

## 2018-04-25 ENCOUNTER — Encounter (HOSPITAL_COMMUNITY): Payer: Self-pay | Admitting: Pharmacy Technician

## 2018-04-25 ENCOUNTER — Other Ambulatory Visit: Payer: Self-pay

## 2018-04-25 ENCOUNTER — Emergency Department (HOSPITAL_COMMUNITY): Payer: No Typology Code available for payment source

## 2018-04-25 ENCOUNTER — Inpatient Hospital Stay (HOSPITAL_COMMUNITY)
Admission: EM | Admit: 2018-04-25 | Discharge: 2018-04-29 | DRG: 190 | Disposition: A | Discharge status: Home / Self Care | Payer: No Typology Code available for payment source | Attending: Internal Medicine | Admitting: Internal Medicine

## 2018-04-25 DIAGNOSIS — E871 Hypo-osmolality and hyponatremia: Secondary | ICD-10-CM | POA: Diagnosis present

## 2018-04-25 DIAGNOSIS — J9621 Acute and chronic respiratory failure with hypoxia: Secondary | ICD-10-CM | POA: Diagnosis not present

## 2018-04-25 DIAGNOSIS — R5381 Other malaise: Secondary | ICD-10-CM | POA: Diagnosis present

## 2018-04-25 DIAGNOSIS — E785 Hyperlipidemia, unspecified: Secondary | ICD-10-CM | POA: Diagnosis present

## 2018-04-25 DIAGNOSIS — E0781 Sick-euthyroid syndrome: Secondary | ICD-10-CM | POA: Diagnosis present

## 2018-04-25 DIAGNOSIS — J9692 Respiratory failure, unspecified with hypercapnia: Secondary | ICD-10-CM

## 2018-04-25 DIAGNOSIS — Z85038 Personal history of other malignant neoplasm of large intestine: Secondary | ICD-10-CM

## 2018-04-25 DIAGNOSIS — Z8701 Personal history of pneumonia (recurrent): Secondary | ICD-10-CM

## 2018-04-25 DIAGNOSIS — K5900 Constipation, unspecified: Secondary | ICD-10-CM | POA: Diagnosis present

## 2018-04-25 DIAGNOSIS — F32A Depression, unspecified: Secondary | ICD-10-CM | POA: Diagnosis present

## 2018-04-25 DIAGNOSIS — Z8249 Family history of ischemic heart disease and other diseases of the circulatory system: Secondary | ICD-10-CM

## 2018-04-25 DIAGNOSIS — R6889 Other general symptoms and signs: Secondary | ICD-10-CM

## 2018-04-25 DIAGNOSIS — Z20828 Contact with and (suspected) exposure to other viral communicable diseases: Secondary | ICD-10-CM | POA: Diagnosis present

## 2018-04-25 DIAGNOSIS — Z20822 Contact with and (suspected) exposure to covid-19: Secondary | ICD-10-CM | POA: Diagnosis present

## 2018-04-25 DIAGNOSIS — C61 Malignant neoplasm of prostate: Secondary | ICD-10-CM | POA: Diagnosis present

## 2018-04-25 DIAGNOSIS — Z9981 Dependence on supplemental oxygen: Secondary | ICD-10-CM

## 2018-04-25 DIAGNOSIS — F1010 Alcohol abuse, uncomplicated: Secondary | ICD-10-CM | POA: Diagnosis present

## 2018-04-25 DIAGNOSIS — J9622 Acute and chronic respiratory failure with hypercapnia: Secondary | ICD-10-CM | POA: Diagnosis present

## 2018-04-25 DIAGNOSIS — J9691 Respiratory failure, unspecified with hypoxia: Secondary | ICD-10-CM | POA: Diagnosis present

## 2018-04-25 DIAGNOSIS — R0603 Acute respiratory distress: Secondary | ICD-10-CM | POA: Diagnosis present

## 2018-04-25 DIAGNOSIS — R338 Other retention of urine: Secondary | ICD-10-CM

## 2018-04-25 DIAGNOSIS — R300 Dysuria: Secondary | ICD-10-CM | POA: Diagnosis present

## 2018-04-25 DIAGNOSIS — F329 Major depressive disorder, single episode, unspecified: Secondary | ICD-10-CM | POA: Diagnosis present

## 2018-04-25 DIAGNOSIS — R140 Abdominal distension (gaseous): Secondary | ICD-10-CM

## 2018-04-25 DIAGNOSIS — E119 Type 2 diabetes mellitus without complications: Secondary | ICD-10-CM | POA: Diagnosis present

## 2018-04-25 DIAGNOSIS — D72810 Lymphocytopenia: Secondary | ICD-10-CM | POA: Diagnosis present

## 2018-04-25 DIAGNOSIS — J441 Chronic obstructive pulmonary disease with (acute) exacerbation: Secondary | ICD-10-CM | POA: Diagnosis not present

## 2018-04-25 DIAGNOSIS — Z87891 Personal history of nicotine dependence: Secondary | ICD-10-CM

## 2018-04-25 DIAGNOSIS — R109 Unspecified abdominal pain: Secondary | ICD-10-CM

## 2018-04-25 DIAGNOSIS — F419 Anxiety disorder, unspecified: Secondary | ICD-10-CM | POA: Diagnosis not present

## 2018-04-25 DIAGNOSIS — Z801 Family history of malignant neoplasm of trachea, bronchus and lung: Secondary | ICD-10-CM

## 2018-04-25 DIAGNOSIS — R339 Retention of urine, unspecified: Secondary | ICD-10-CM | POA: Diagnosis present

## 2018-04-25 DIAGNOSIS — Z923 Personal history of irradiation: Secondary | ICD-10-CM

## 2018-04-25 DIAGNOSIS — Z8551 Personal history of malignant neoplasm of bladder: Secondary | ICD-10-CM

## 2018-04-25 DIAGNOSIS — R1900 Intra-abdominal and pelvic swelling, mass and lump, unspecified site: Secondary | ICD-10-CM

## 2018-04-25 DIAGNOSIS — F319 Bipolar disorder, unspecified: Secondary | ICD-10-CM | POA: Diagnosis present

## 2018-04-25 LAB — CBC WITH DIFFERENTIAL/PLATELET
Abs Immature Granulocytes: 0.11 10*3/uL — ABNORMAL HIGH (ref 0.00–0.07)
Basophils Absolute: 0 10*3/uL (ref 0.0–0.1)
Basophils Relative: 0 %
Eosinophils Absolute: 0 10*3/uL (ref 0.0–0.5)
Eosinophils Relative: 0 %
HCT: 40.1 % (ref 39.0–52.0)
Hemoglobin: 13 g/dL (ref 13.0–17.0)
Immature Granulocytes: 2 %
Lymphocytes Relative: 10 %
Lymphs Abs: 0.6 10*3/uL — ABNORMAL LOW (ref 0.7–4.0)
MCH: 30.7 pg (ref 26.0–34.0)
MCHC: 32.4 g/dL (ref 30.0–36.0)
MCV: 94.6 fL (ref 80.0–100.0)
Monocytes Absolute: 0.5 10*3/uL (ref 0.1–1.0)
Monocytes Relative: 10 %
Neutro Abs: 4.4 10*3/uL (ref 1.7–7.7)
Neutrophils Relative %: 78 %
Platelets: 254 10*3/uL (ref 150–400)
RBC: 4.24 MIL/uL (ref 4.22–5.81)
RDW: 14 % (ref 11.5–15.5)
WBC: 5.7 10*3/uL (ref 4.0–10.5)
nRBC: 0 % (ref 0.0–0.2)

## 2018-04-25 LAB — BASIC METABOLIC PANEL
Anion gap: 12 (ref 5–15)
BUN: 17 mg/dL (ref 8–23)
CO2: 33 mmol/L — ABNORMAL HIGH (ref 22–32)
Calcium: 8.7 mg/dL — ABNORMAL LOW (ref 8.9–10.3)
Chloride: 84 mmol/L — ABNORMAL LOW (ref 98–111)
Creatinine, Ser: 0.7 mg/dL (ref 0.61–1.24)
GFR calc Af Amer: >60 mL/min (ref >60)
GFR calc non Af Amer: >60 mL/min (ref >60)
Glucose, Bld: 139 mg/dL — ABNORMAL HIGH (ref 70–99)
Potassium: 4.7 mmol/L (ref 3.5–5.1)
Sodium: 129 mmol/L — ABNORMAL LOW (ref 135–145)

## 2018-04-25 MED ORDER — IPRATROPIUM-ALBUTEROL 20-100 MCG/ACT IN AERS
1.0000 | INHALATION_SPRAY | Freq: Four times a day (QID) | RESPIRATORY_TRACT | Status: DC | PRN
  Filled 2018-04-25: qty 4

## 2018-04-25 MED ORDER — ALBUTEROL SULFATE HFA 108 (90 BASE) MCG/ACT IN AERS
2.0000 | INHALATION_SPRAY | RESPIRATORY_TRACT | Status: DC | PRN
  Administered 2018-04-25: 2 via RESPIRATORY_TRACT
  Filled 2018-04-25: qty 6.7

## 2018-04-25 NOTE — H&P (Addendum)
History and Physical    Taylor [name] TML:[id number] DOB: April 16, 1955 DOA: 04/25/2018  Referring MD/NP/PA:   PCP: Clent Demark, PA-C   Patient coming from:  The patient is coming from home.  At baseline, pt is independent for most of ADL.        Chief Complaint: SOB  HPI: Taylor [name] is a 63 y.o. male with medical history significant of COPD on 2 L home oxygen, former smoker, hyperlipidemia, diabetes mellitus, depression, bipolar disorder, colon cancer, prostate cancer on radiation therapy, alcohol abuse, tobacco abuse, hyponatremia, who presents with shortness of breath.  Patient was recently hospitalized from 3/24-3/26 due to COPD exacerbation.  He was discharged at stable condition.  Patient states that in the past 2 days, he developed worsening shortness of breath, which has been progressively getting worse.  He has cough with white mucus production and wheezing.  Patient does not have chest pain, fever, chills, sore throat, runny nose.  No sick contacts.  No recent long distance traveling.  Denies nausea, vomiting, diarrhea, abdominal pain, symptoms of UTI or unilateral weakness.  Patient states that he has history of prostate cancer, currently he is doing radiation therapy.  He has difficulty urinating, dysuria and burning on urination.  His doctor put him on Bactrim empirically for possible urine infection without checking UA on 4/1.  Patient can barely speak in full sentence.  He is obviously using sternocleidomastoid muscle for breathing when I talked to pt.  ED Course: pt was found to have WBC 5.7, pending urinalysis, sodium 129, renal function normal, temperature normal, tachycardia, tachypnea, oxygen saturation 97% on 5 L nasal cannula oxygen, chest x-ray showed COPD without infiltration.  Patient is admitted to telemetry bed as inpatient.  Review of Systems:   General: no fevers, chills, no body weight gain, has fatigue HEENT: no blurry vision, hearing changes or  sore throat Respiratory: has dyspnea, coughing, wheezing CV: no chest pain, no palpitations GI: no nausea, vomiting, abdominal pain, diarrhea, constipation GU: has dysuria, burning on urination, difficulty urinating, increased urinary frequency, no hematuria  Ext: no leg edema Neuro: no unilateral weakness, numbness, or tingling, no vision change or hearing loss Skin: no rash, no skin tear. MSK: No muscle spasm, no deformity, no limitation of range of movement in spin Heme: No easy bruising.  Travel history: No recent long distant travel.  Allergy:  Allergies  Allergen Reactions  . Codeine Nausea And Vomiting    Past Medical History:  Diagnosis Date  . Acute on chronic respiratory failure ([place])   . Asthma   . Bipolar 1 disorder ([place]) 02/05/2012  . Colon cancer ([place]) 04/11/11   adenocarcinoma of colon, 7/19 nodes pos.FINISHED CHEMO/DR. SHERRILL  . COPD (chronic obstructive pulmonary disease) ([place])    SMOKER  . Depression   . Dyspnea   . Emphysema of lung ([place])   . Full dentures   . Hemorrhoids   . On home oxygen therapy    "3L; 24/7" (05/29/2017)  . Oxygen deficiency   . Pneumonia ~ 2016   "double pneumonia"  . Prostate cancer ([place])    Gleason score = 7, supposed to have radiation therapy but he has not followed up (05/29/2017)  . Rib fractures    hx of    Past Surgical History:  Procedure Laterality Date  . COLON SURGERY  04/11/11   Sigmoid colectomy  . COLOSTOMY REVISION  04/11/2011   Procedure: COLON RESECTION SIGMOID;  Surgeon: [name], MD;  Location: [facility];  Service: General;  Laterality: N/A;  low anterior colon resection   . GOLD SEED IMPLANT N/A 02/24/2018   Procedure: GOLD SEED IMPLANT;  Surgeon: [name], MD;  Location: [facility];  Service: Urology;  Laterality: N/A;  . HERNIA REPAIR    . INGUINAL HERNIA REPAIR Right 05/01/2012   Procedure: HERNIA REPAIR INGUINAL ADULT;  Surgeon: [name], MD;  Location: [facility];  Service: General;  Laterality: Right;  .  INSERTION OF MESH Right 05/01/2012   Procedure: INSERTION OF MESH;  Surgeon: [name], MD;  Location: [facility];  Service: General;  Laterality: Right;  . PORT-A-CATH REMOVAL Left 05/01/2012   Procedure: REMOVAL Infusion Port;  Surgeon: [name], MD;  Location: [facility];  Service: General;  Laterality: Left;  . PORTACATH PLACEMENT  05/02/2011   Procedure: INSERTION PORT-A-CATH;  Surgeon: [name], MD;  Location: [facility];  Service: General;  Laterality: N/A;  . PROSTATE BIOPSY    . SPACE OAR INSTILLATION N/A 02/24/2018   Procedure: SPACE OAR INSTILLATION;  Surgeon: [name], MD;  Location: [facility];  Service: Urology;  Laterality: N/A;    Social History:  reports that he quit smoking about 11 months ago. His smoking use included cigarettes. He has a 4.60 pack-year smoking history. He has quit using smokeless tobacco.  His smokeless tobacco use included chew. He reports previous alcohol use. He reports that he does not use drugs.  Family History:  Family History  Problem Relation Age of Onset  . Heart disease Father   . Heart failure Mother   . Heart disease Mother   . Lung cancer Maternal Uncle        smoked     Prior to Admission medications   Medication Sig Start Date End Date Taking? Authorizing Provider  albuterol (PROVENTIL) (2.5 MG/3ML) 0.083% nebulizer solution Take 3 mLs (2.5 mg total) by nebulization every 4 (four) hours as needed for wheezing or shortness of breath (if you can't catch your breath). 04/23/18   Harris, Abigail, PA-C  ARIPiprazole (ABILIFY) 5 MG tablet Take 5 mg by mouth daily.    [provider]  Blood Glucose Monitoring Suppl (ACCU-CHEK AVIVA) device Use as instructed 10/27/17 10/27/18  Clent Demark, PA-C  escitalopram (LEXAPRO) 20 MG tablet Take 20 mg by mouth daily.    [provider]  glucose blood (ACCU-CHEK AVIVA) test strip 1 each by Other route as needed for other. Use as instructed 10/27/17   Clent Demark, PA-C  Lancets  [facility]) lancets 1 each by Other route 2 (two) times daily. Use as instructed 10/27/17   Clent Demark, PA-C  loperamide (IMODIUM) 2 MG capsule Take 2 every 6 hours if continued diarrhea Patient taking differently: Take 2 mg by mouth as needed for diarrhea or loose stools. Take 2 every 6 hours if continued diarrhea 04/10/18   [name], MD  lovastatin (MEVACOR) 20 MG tablet Take 1 tablet (20 mg total) by mouth at bedtime. 10/27/17   Clent Demark, PA-C  metFORMIN (GLUCOPHAGE) 1000 MG tablet Take 1 tablet (1,000 mg total) by mouth 2 (two) times daily with a meal. 10/27/17   Clent Demark, PA-C  mometasone-formoterol [facility]) 100-5 MCG/ACT AERO Inhale 2 puffs into the lungs 2 (two) times daily. 04/23/18   Harris, Vernie Shanks, PA-C  OXYGEN Inhale 3 L into the lungs continuous.     [provider]  predniSONE (DELTASONE) 5 MG tablet 6 tab x 1  day, 5 tab x 1 day, 4 tab x 1 day, 3 tab x 1 day, 2 tab x 1 day, 1 tab x 1 day, stop 04/14/18   Harle Stanford., PA-C  sulfamethoxazole-trimethoprim (BACTRIM DS,SEPTRA DS) 800-160 MG tablet Take 1 tablet by mouth 2 (two) times daily. 04/14/18   Tanner, Lyndon Code., PA-C  tamsulosin (FLOMAX) 0.4 MG CAPS capsule Take 1 capsule (0.4 mg total) by mouth daily. Patient taking differently: Take 0.4 mg by mouth daily as needed (fluid).  03/13/18   Shelly Coss, MD  tiotropium (SPIRIVA) 18 MCG inhalation capsule Place 1 capsule (18 mcg total) into inhaler and inhale daily. 04/06/18   [name], MD    Physical Exam: Vitals:   04/25/18 2245 04/25/18 2300 04/25/18 2324 04/25/18 2345  BP: 107/60 129/81 126/74 118/78  Pulse: 78 (!) 105 94 (!) 109  Resp: (!) 31 (!) 28 20 (!) 27  Temp:      TempSrc:      SpO2: 97% 97% 98% 97%  Weight:      Height:       General: Not in acute distress HEENT:       Eyes: PERRL, EOMI, no scleral icterus.       ENT: No discharge from the ears and nose, no pharynx injection, no tonsillar enlargement.         Neck: No JVD, no bruit, no mass felt. Heme: No neck lymph node enlargement. Cardiac: S1/S2, RRR, No murmurs, No gallops or rubs. Respiratory: Has severely decreased air movement bilaterally, has mild wheezing bilaterally. GI: Soft, nondistended, nontender, no rebound pain, no organomegaly, BS present. GU: No hematuria Ext: No pitting leg edema bilaterally. 2+DP/PT pulse bilaterally. Musculoskeletal: No joint deformities, No joint redness or warmth, no limitation of ROM in spin. Skin: No rashes.  Neuro: Alert, oriented X3, cranial nerves II-XII grossly intact, moves all extremities normally. Psych: Patient is not psychotic, no suicidal or hemocidal ideation.  Labs on Admission: I have personally reviewed following labs and imaging studies  CBC: Recent Labs  Lab 04/23/18 1442 04/25/18 2022  WBC 5.1 5.7  NEUTROABS 4.6 4.4  HGB 11.6* 13.0  HCT 35.1* 40.1  MCV 96.4 94.6  PLT 205 409   Basic Metabolic Panel: Recent Labs  Lab 04/23/18 1442 04/25/18 2022  NA 132* 129*  K 4.7 4.7  CL 90* 84*  CO2 35* 33*  GLUCOSE 125* 139*  BUN 14 17  CREATININE 0.65 0.70  CALCIUM 8.2* 8.7*   GFR: Estimated Creatinine Clearance: 100.7 mL/min (by C-G formula based on SCr of 0.7 mg/dL). Liver Function Tests: No results for input(s): AST, ALT, ALKPHOS, BILITOT, PROT, ALBUMIN in the last 168 hours. No results for input(s): LIPASE, AMYLASE in the last 168 hours. No results for input(s): AMMONIA in the last 168 hours. Coagulation Profile: No results for input(s): INR, PROTIME in the last 168 hours. Cardiac Enzymes: No results for input(s): CKTOTAL, CKMB, CKMBINDEX, TROPONINI in the last 168 hours. BNP (last 3 results) No results for input(s): PROBNP in the last 8760 hours. HbA1C: No results for input(s): HGBA1C in the last 72 hours. CBG: No results for input(s): GLUCAP in the last 168 hours. Lipid Profile: No results for input(s): CHOL, HDL, LDLCALC, TRIG, CHOLHDL, LDLDIRECT in the last 72  hours. Thyroid Function Tests: No results for input(s): TSH, T4TOTAL, FREET4, T3FREE, THYROIDAB in the last 72 hours. Anemia Panel: No results for input(s): VITAMINB12, FOLATE, FERRITIN, TIBC, IRON, RETICCTPCT in the last 72 hours. Urine analysis:  Component Value Date/Time   COLORURINE YELLOW 04/25/2018 2345   APPEARANCEUR CLEAR 04/25/2018 2345   LABSPEC 1.014 04/25/2018 2345   PHURINE 6.0 04/25/2018 2345   GLUCOSEU NEGATIVE 04/25/2018 2345   HGBUR NEGATIVE 04/25/2018 2345   BILIRUBINUR NEGATIVE 04/25/2018 2345   KETONESUR 20 (A) 04/25/2018 2345   PROTEINUR 30 (A) 04/25/2018 2345   UROBILINOGEN 1.0 01/09/2013 1843   NITRITE NEGATIVE 04/25/2018 2345   LEUKOCYTESUR NEGATIVE 04/25/2018 2345   Sepsis Labs: @LABRCNTIP (procalcitonin:4,lacticidven:4) )No results found for this or any previous visit (from the past 240 hour(s)).   Radiological Exams on Admission: Dg Chest Port 1 View  Result Date: 04/25/2018 CLINICAL DATA:  Shortness of breath. EXAM: PORTABLE CHEST 1 VIEW COMPARISON:  April 23, 2018 FINDINGS: Healed right rib fractures are noted. The heart, hila, and mediastinum are normal. Hyperinflation of the lungs is identified. No nodules, masses, or focal infiltrates. IMPRESSION: COPD.  No acute abnormalities. Electronically Signed   By: Dorise Bullion III M.D   On: 04/25/2018 21:11     EKG: Independently reviewed.  Sinus rhythm, tachycardia, QTC 410, low voltage, LAD, anteroseptal infarction pattern, poor R wave progression   Assessment/Plan Principal Problem:   Acute on chronic respiratory failure with hypoxia (HCC) Active Problems:   Depression   Alcohol abuse   COPD exacerbation (HCC)   HLD (hyperlipidemia)   Prostate cancer (HCC)   Diabetes mellitus without complication (HCC)   Hyponatremia   Acute on chronic respiratory failure with hypoxia (HCC) due to COPD exacerbation: pt has severely decreased air movement and no wheezing on auscultation, consistent with COPD  exacerbation.  Chest x-ray negative for infiltration.  Patient does not have fever or leukocytosis.  Clinically not septic.    -will admit patient to telemetry bed  -Scheduled Atrovent inhaler and prn Xopenex inhaler -Dulera inhaler -Mucinex for cough  -Incentive spirometry -Follow up sputum culture, respiratory virus panel, Flu pcr -Nasal cannula oxygen as needed to maintain O2 saturation 93% or greater  COVID subjective risk assessment: though pt dose not have fever and infiltration on chest x-ray, pt has increased oxygen demand (needs 5L nasal cannula oxygen) and lymphopenia (lymphocyte 0.6), will test pt for COVID19, consider patient as high risk due to the need of 5L oxygen.  Physician PPE: I used Capr in pt's room, gown, glove Patient PPE: N95 mask, gown, glove Fever: no Cough: yes  SOB: yes URI symptoms: yes GI symptoms: no Travel: no Sick contacts: no CBC: ho leukopenia, but  has lymphopenia BMP: increased BUN/Cr=17/0.7 LFTs: increased AST/ALT/Tbili-->pending CRP, LDH: pending Procalcitonin: pending  CXR: hazy bilateral peripheral opacities-->no CT chest: GGO, consolidation, crazy paving-->CT chest not done COVID subjective risk assessment: high risk due to the need of 5L oxygen COVID Testing: indicated per current ID/[place] guidelines-->yest Precaution: droplet and airborne  Prostate cancer: Gleason score 7.  Patient is receiving radiation therapy, last dose was last week. Pt has difficulty urinating, dysuria, burning on urination.  Patient was put on Bactrim empirically for possible UTI on 4/1. -will continue Bactrim now -Check urinalysis and urine culture  Diabetes mellitus without complication: Last K0X 7.7, poor controled. Patient is taking metformin at home -SSI  Depression: -Abilify and Lexapro  HLD (hyperlipidemia):  -Pravastatin  Alcohol abuse: pt states that he stopped drinking alcohol several months ago.  No signs of withdrawal now. -observe  closely for any signs of withdrawal  Hyponatremia: Sodium 129.  Patient has chronic hyponatremia, but recently sodium was normal.  Unclear etiology.  May be due  to decreased oral intake. -NS 75 cc/h -check TSH   Inpatient status:  # Patient requires inpatient status due to high intensity of service, high risk for further deterioration and high frequency of surveillance required.  I certify that at the point of admission it is my clinical judgment that the patient will require inpatient hospital care spanning beyond 2 midnights from the point of admission.  . This patient has multiple chronic comorbidities including COPD on 2 L home oxygen, former smoker, hyperlipidemia, diabetes mellitus, depression, bipolar disorder, colon cancer, prostate cancer on radiation therapy, alcohol abuse, tobacco abuse, hyponatremia . Now patient has presenting with acute on chronic respiratory failure due to COPD exacerbation.  . The worrisome physical exam findings include severely decreased air movement bilaterally and wheezing bilaterally on auscultation. . The initial radiographic and laboratory data are worrisome because of hyponatremia . Current medical needs: please see my assessment and plan . Predictability of an adverse outcome (risk): Patient is multiple comorbidities, including COPD and chronic respiratory failure on 2 L oxygen at home.  Now presents with worsening shortness of breath and acute on chronic respiratory failure due to COPD exacerbation.  Patient has an increased oxygen demand than the baseline.  He is at high risk of deteriorating.  Will need to be treated in hospital for at least 2 days.    DVT ppx:  SQ Lovenox Code Status: Full code Family Communication: None at bed side.  Disposition Plan:  Anticipate discharge back to previous home environment Consults called:  none Admission status: Inpatient/tele      Date of Service 04/26/2018    [facility]   If 7PM-7AM,  please contact night-coverage [email or web address] Password [facility] 04/26/2018, 12:36 AM

## 2018-04-25 NOTE — ED Triage Notes (Signed)
Pt arrives via ems from home with increased sob over the last few days. Hx COPD. 10 puffs on albuterol pta as well as 5mg  en route. 125mg  solumedrol and 2grams magnesium given en route. Pt placed on cpap until arrival to the ed where he was placed on NRB @ 15L. Pt with apparent work of breathing. Audible wheezing. 177/115, HR 120, RR 28-36, 100% NRB.

## 2018-04-25 NOTE — ED Notes (Signed)
Admitting MD at bedside evaluating pt.
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
## 2018-04-26 ENCOUNTER — Inpatient Hospital Stay (HOSPITAL_COMMUNITY): Payer: No Typology Code available for payment source

## 2018-04-26 DIAGNOSIS — R1084 Generalized abdominal pain: Secondary | ICD-10-CM

## 2018-04-26 DIAGNOSIS — R339 Retention of urine, unspecified: Secondary | ICD-10-CM | POA: Diagnosis present

## 2018-04-26 DIAGNOSIS — F339 Major depressive disorder, recurrent, unspecified: Secondary | ICD-10-CM

## 2018-04-26 DIAGNOSIS — Z8249 Family history of ischemic heart disease and other diseases of the circulatory system: Secondary | ICD-10-CM | POA: Diagnosis not present

## 2018-04-26 DIAGNOSIS — K5900 Constipation, unspecified: Secondary | ICD-10-CM | POA: Diagnosis present

## 2018-04-26 DIAGNOSIS — J441 Chronic obstructive pulmonary disease with (acute) exacerbation: Secondary | ICD-10-CM | POA: Diagnosis present

## 2018-04-26 DIAGNOSIS — Z85038 Personal history of other malignant neoplasm of large intestine: Secondary | ICD-10-CM | POA: Diagnosis not present

## 2018-04-26 DIAGNOSIS — Z9981 Dependence on supplemental oxygen: Secondary | ICD-10-CM | POA: Diagnosis not present

## 2018-04-26 DIAGNOSIS — R6889 Other general symptoms and signs: Secondary | ICD-10-CM

## 2018-04-26 DIAGNOSIS — R300 Dysuria: Secondary | ICD-10-CM | POA: Diagnosis present

## 2018-04-26 DIAGNOSIS — C61 Malignant neoplasm of prostate: Secondary | ICD-10-CM | POA: Diagnosis present

## 2018-04-26 DIAGNOSIS — F329 Major depressive disorder, single episode, unspecified: Secondary | ICD-10-CM | POA: Diagnosis not present

## 2018-04-26 DIAGNOSIS — E119 Type 2 diabetes mellitus without complications: Secondary | ICD-10-CM | POA: Diagnosis present

## 2018-04-26 DIAGNOSIS — R338 Other retention of urine: Secondary | ICD-10-CM | POA: Diagnosis not present

## 2018-04-26 DIAGNOSIS — E785 Hyperlipidemia, unspecified: Secondary | ICD-10-CM | POA: Diagnosis present

## 2018-04-26 DIAGNOSIS — J9621 Acute and chronic respiratory failure with hypoxia: Secondary | ICD-10-CM | POA: Diagnosis present

## 2018-04-26 DIAGNOSIS — R140 Abdominal distension (gaseous): Secondary | ICD-10-CM

## 2018-04-26 DIAGNOSIS — F319 Bipolar disorder, unspecified: Secondary | ICD-10-CM | POA: Diagnosis present

## 2018-04-26 DIAGNOSIS — Z8701 Personal history of pneumonia (recurrent): Secondary | ICD-10-CM | POA: Diagnosis not present

## 2018-04-26 DIAGNOSIS — F1010 Alcohol abuse, uncomplicated: Secondary | ICD-10-CM | POA: Diagnosis present

## 2018-04-26 DIAGNOSIS — R0603 Acute respiratory distress: Secondary | ICD-10-CM

## 2018-04-26 DIAGNOSIS — F419 Anxiety disorder, unspecified: Secondary | ICD-10-CM | POA: Diagnosis present

## 2018-04-26 DIAGNOSIS — Z20828 Contact with and (suspected) exposure to other viral communicable diseases: Secondary | ICD-10-CM | POA: Diagnosis present

## 2018-04-26 DIAGNOSIS — D72810 Lymphocytopenia: Secondary | ICD-10-CM | POA: Diagnosis present

## 2018-04-26 DIAGNOSIS — E0781 Sick-euthyroid syndrome: Secondary | ICD-10-CM | POA: Diagnosis present

## 2018-04-26 DIAGNOSIS — Z923 Personal history of irradiation: Secondary | ICD-10-CM | POA: Diagnosis not present

## 2018-04-26 DIAGNOSIS — Z8551 Personal history of malignant neoplasm of bladder: Secondary | ICD-10-CM | POA: Diagnosis not present

## 2018-04-26 DIAGNOSIS — Z20822 Contact with and (suspected) exposure to covid-19: Secondary | ICD-10-CM | POA: Diagnosis present

## 2018-04-26 DIAGNOSIS — Z87891 Personal history of nicotine dependence: Secondary | ICD-10-CM | POA: Diagnosis not present

## 2018-04-26 DIAGNOSIS — E871 Hypo-osmolality and hyponatremia: Secondary | ICD-10-CM | POA: Diagnosis present

## 2018-04-26 DIAGNOSIS — R1900 Intra-abdominal and pelvic swelling, mass and lump, unspecified site: Secondary | ICD-10-CM | POA: Diagnosis not present

## 2018-04-26 DIAGNOSIS — R5381 Other malaise: Secondary | ICD-10-CM | POA: Diagnosis present

## 2018-04-26 DIAGNOSIS — J9622 Acute and chronic respiratory failure with hypercapnia: Secondary | ICD-10-CM | POA: Diagnosis present

## 2018-04-26 LAB — BLOOD GAS, ARTERIAL
Acid-Base Excess: 9.1 mmol/L — ABNORMAL HIGH (ref 0.0–2.0)
Bicarbonate: 35.4 mmol/L — ABNORMAL HIGH (ref 20.0–28.0)
Drawn by: 441371
O2 Content: 4 L/min
O2 Saturation: 96.2 %
Patient temperature: 98.6
pCO2 arterial: 73.5 mmHg (ref 32.0–48.0)
pH, Arterial: 7.304 — ABNORMAL LOW (ref 7.350–7.450)
pO2, Arterial: 87.8 mmHg (ref 83.0–108.0)

## 2018-04-26 LAB — HIV ANTIBODY (ROUTINE TESTING W REFLEX): HIV Screen 4th Generation wRfx: NONREACTIVE

## 2018-04-26 LAB — GLUCOSE, CAPILLARY
Glucose-Capillary: 66 mg/dL — ABNORMAL LOW (ref 70–99)
Glucose-Capillary: 84 mg/dL (ref 70–99)
Glucose-Capillary: 85 mg/dL (ref 70–99)
Glucose-Capillary: 107 mg/dL — ABNORMAL HIGH (ref 70–99)

## 2018-04-26 LAB — TRIGLYCERIDES: Triglycerides: 82 mg/dL (ref <150)

## 2018-04-26 LAB — INFLUENZA PANEL BY PCR (TYPE A & B)
Influenza A By PCR: NEGATIVE
Influenza B By PCR: NEGATIVE

## 2018-04-26 LAB — D-DIMER, QUANTITATIVE: D-Dimer, Quant: <0.27 ug/mL-FEU (ref 0.00–0.50)

## 2018-04-26 LAB — HEPATIC FUNCTION PANEL
ALT: 25 U/L (ref 0–44)
AST: 31 U/L (ref 15–41)
Albumin: 3.9 g/dL (ref 3.5–5.0)
Alkaline Phosphatase: 41 U/L (ref 38–126)
Bilirubin, Direct: 0.1 mg/dL (ref 0.0–0.2)
Indirect Bilirubin: 0.4 mg/dL (ref 0.3–0.9)
Total Bilirubin: 0.5 mg/dL (ref 0.3–1.2)
Total Protein: 6.1 g/dL — ABNORMAL LOW (ref 6.5–8.1)

## 2018-04-26 LAB — RESPIRATORY PANEL BY PCR

## 2018-04-26 LAB — COMPREHENSIVE METABOLIC PANEL
ALT: 27 U/L (ref 0–44)
AST: 30 U/L (ref 15–41)
Albumin: 4.1 g/dL (ref 3.5–5.0)
Alkaline Phosphatase: 43 U/L (ref 38–126)
Anion gap: 11 (ref 5–15)
BUN: 18 mg/dL (ref 8–23)
CO2: 30 mmol/L (ref 22–32)
Calcium: 8.3 mg/dL — ABNORMAL LOW (ref 8.9–10.3)
Chloride: 91 mmol/L — ABNORMAL LOW (ref 98–111)
Creatinine, Ser: 0.75 mg/dL (ref 0.61–1.24)
GFR calc Af Amer: >60 mL/min (ref >60)
GFR calc non Af Amer: >60 mL/min (ref >60)
Glucose, Bld: 110 mg/dL — ABNORMAL HIGH (ref 70–99)
Potassium: 4.7 mmol/L (ref 3.5–5.1)
Sodium: 132 mmol/L — ABNORMAL LOW (ref 135–145)
Total Bilirubin: 0.5 mg/dL (ref 0.3–1.2)
Total Protein: 6.2 g/dL — ABNORMAL LOW (ref 6.5–8.1)

## 2018-04-26 LAB — CBC WITH DIFFERENTIAL/PLATELET
Abs Immature Granulocytes: 0.07 10*3/uL (ref 0.00–0.07)
Basophils Absolute: 0 10*3/uL (ref 0.0–0.1)
Basophils Relative: 0 %
Eosinophils Absolute: 0 10*3/uL (ref 0.0–0.5)
Eosinophils Relative: 0 %
HCT: 35.9 % — ABNORMAL LOW (ref 39.0–52.0)
Hemoglobin: 11.9 g/dL — ABNORMAL LOW (ref 13.0–17.0)
Immature Granulocytes: 1 %
Lymphocytes Relative: 5 %
Lymphs Abs: 0.3 10*3/uL — ABNORMAL LOW (ref 0.7–4.0)
MCH: 31.2 pg (ref 26.0–34.0)
MCHC: 33.1 g/dL (ref 30.0–36.0)
MCV: 94 fL (ref 80.0–100.0)
Monocytes Absolute: 0.7 10*3/uL (ref 0.1–1.0)
Monocytes Relative: 12 %
Neutro Abs: 4.9 10*3/uL (ref 1.7–7.7)
Neutrophils Relative %: 82 %
Platelets: 249 10*3/uL (ref 150–400)
RBC: 3.82 MIL/uL — ABNORMAL LOW (ref 4.22–5.81)
RDW: 13.8 % (ref 11.5–15.5)
WBC: 6.1 10*3/uL (ref 4.0–10.5)
nRBC: 0 % (ref 0.0–0.2)

## 2018-04-26 LAB — URINALYSIS, ROUTINE W REFLEX MICROSCOPIC
Bacteria, UA: NONE SEEN
Bilirubin Urine: NEGATIVE
Glucose, UA: NEGATIVE mg/dL
Hgb urine dipstick: NEGATIVE
Ketones, ur: 20 mg/dL — AB
Leukocytes,Ua: NEGATIVE
Nitrite: NEGATIVE
Protein, ur: 30 mg/dL — AB
Specific Gravity, Urine: 1.014 (ref 1.005–1.030)
pH: 6 (ref 5.0–8.0)

## 2018-04-26 LAB — TROPONIN I: Troponin I: <0.03 ng/mL (ref <0.03)

## 2018-04-26 LAB — LACTATE DEHYDROGENASE
LDH: 162 U/L (ref 98–192)
LDH: 151 U/L (ref 98–192)

## 2018-04-26 LAB — TSH: TSH: 0.222 u[IU]/mL — ABNORMAL LOW (ref 0.350–4.500)

## 2018-04-26 LAB — CBG MONITORING, ED: Glucose-Capillary: 141 mg/dL — ABNORMAL HIGH (ref 70–99)

## 2018-04-26 LAB — FERRITIN: Ferritin: 90 ng/mL (ref 24–336)

## 2018-04-26 LAB — PROCALCITONIN: Procalcitonin: <0.1 ng/mL

## 2018-04-26 LAB — FIBRINOGEN: Fibrinogen: 281 mg/dL (ref 210–475)

## 2018-04-26 LAB — C-REACTIVE PROTEIN
CRP: <0.8 mg/dL (ref <1.0)
CRP: <0.8 mg/dL (ref <1.0)

## 2018-04-26 LAB — STREP PNEUMONIAE URINARY ANTIGEN: Strep Pneumo Urinary Antigen: NEGATIVE

## 2018-04-26 MED ORDER — IPRATROPIUM BROMIDE HFA 17 MCG/ACT IN AERS
2.0000 | INHALATION_SPRAY | Freq: Four times a day (QID) | RESPIRATORY_TRACT | Status: DC
  Administered 2018-04-26 – 2018-04-27 (×7): 2 via RESPIRATORY_TRACT
  Filled 2018-04-26: qty 12.9

## 2018-04-26 MED ORDER — SULFAMETHOXAZOLE-TRIMETHOPRIM 800-160 MG PO TABS
1.0000 | ORAL_TABLET | Freq: Two times a day (BID) | ORAL | Status: DC
  Administered 2018-04-26 – 2018-04-29 (×8): 1 via ORAL
  Filled 2018-04-26 (×8): qty 1

## 2018-04-26 MED ORDER — METHYLPREDNISOLONE SODIUM SUCC 125 MG IJ SOLR: 60.0000 mg | Freq: Three times a day (TID) | INTRAMUSCULAR | Status: DC

## 2018-04-26 MED ORDER — OXYCODONE HCL 5 MG PO TABS
5.0000 mg | ORAL_TABLET | ORAL | Status: DC | PRN
  Administered 2018-04-26 – 2018-04-28 (×3): 5 mg via ORAL
  Filled 2018-04-26 (×3): qty 1

## 2018-04-26 MED ORDER — LOPERAMIDE HCL 2 MG PO CAPS: 2.0000 mg | ORAL_CAPSULE | ORAL | Status: DC | PRN

## 2018-04-26 MED ORDER — LEVALBUTEROL TARTRATE 45 MCG/ACT IN AERO
2.0000 | INHALATION_SPRAY | Freq: Four times a day (QID) | RESPIRATORY_TRACT | Status: DC
  Administered 2018-04-26 – 2018-04-27 (×8): 2 via RESPIRATORY_TRACT
  Filled 2018-04-26: qty 15

## 2018-04-26 MED ORDER — PRAVASTATIN SODIUM 10 MG PO TABS
20.0000 mg | ORAL_TABLET | Freq: Every day | ORAL | Status: DC
  Administered 2018-04-26 – 2018-04-28 (×3): 20 mg via ORAL
  Filled 2018-04-26 (×2): qty 2

## 2018-04-26 MED ORDER — TAMSULOSIN HCL 0.4 MG PO CAPS
0.4000 mg | ORAL_CAPSULE | Freq: Every day | ORAL | Status: DC | PRN
  Administered 2018-04-26: 0.4 mg via ORAL
  Filled 2018-04-26: qty 1

## 2018-04-26 MED ORDER — ESCITALOPRAM OXALATE 20 MG PO TABS
20.0000 mg | ORAL_TABLET | Freq: Every day | ORAL | Status: DC
  Administered 2018-04-26 – 2018-04-29 (×5): 20 mg via ORAL
  Filled 2018-04-26 (×5): qty 1

## 2018-04-26 MED ORDER — INSULIN ASPART 100 UNIT/ML ~~LOC~~ SOLN
0.0000 [IU] | Freq: Every day | SUBCUTANEOUS | Status: DC
  Administered 2018-04-27: 2 [IU] via SUBCUTANEOUS

## 2018-04-26 MED ORDER — LEVALBUTEROL TARTRATE 45 MCG/ACT IN AERO: 2.0000 | INHALATION_SPRAY | Freq: Four times a day (QID) | RESPIRATORY_TRACT | Status: DC | PRN

## 2018-04-26 MED ORDER — MORPHINE SULFATE (PF) 2 MG/ML IV SOLN
1.0000 mg | INTRAVENOUS | Status: DC | PRN
  Administered 2018-04-26 (×2): 1 mg via INTRAVENOUS
  Filled 2018-04-26 (×2): qty 1

## 2018-04-26 MED ORDER — SODIUM CHLORIDE 0.9 % IV SOLN: INTRAVENOUS | Status: DC

## 2018-04-26 MED ORDER — DM-GUAIFENESIN ER 30-600 MG PO TB12
1.0000 | ORAL_TABLET | Freq: Two times a day (BID) | ORAL | Status: DC | PRN
  Administered 2018-04-26: 1 via ORAL
  Filled 2018-04-26: qty 1

## 2018-04-26 MED ORDER — IPRATROPIUM BROMIDE HFA 17 MCG/ACT IN AERS: 2.0000 | INHALATION_SPRAY | RESPIRATORY_TRACT | Status: DC

## 2018-04-26 MED ORDER — MOMETASONE FURO-FORMOTEROL FUM 100-5 MCG/ACT IN AERO
2.0000 | INHALATION_SPRAY | Freq: Two times a day (BID) | RESPIRATORY_TRACT | Status: DC
  Administered 2018-04-26 – 2018-04-29 (×7): 2 via RESPIRATORY_TRACT
  Filled 2018-04-26: qty 8.8

## 2018-04-26 MED ORDER — INSULIN ASPART 100 UNIT/ML ~~LOC~~ SOLN
0.0000 [IU] | Freq: Three times a day (TID) | SUBCUTANEOUS | Status: DC
  Administered 2018-04-26: 1 [IU] via SUBCUTANEOUS
  Administered 2018-04-27 (×2): 2 [IU] via SUBCUTANEOUS
  Administered 2018-04-28: 1 [IU] via SUBCUTANEOUS
  Administered 2018-04-28 – 2018-04-29 (×2): 2 [IU] via SUBCUTANEOUS

## 2018-04-26 MED ORDER — ARIPIPRAZOLE 5 MG PO TABS
5.0000 mg | ORAL_TABLET | Freq: Every day | ORAL | Status: DC
  Administered 2018-04-26 – 2018-04-29 (×5): 5 mg via ORAL
  Filled 2018-04-26 (×5): qty 1

## 2018-04-26 MED ORDER — SODIUM CHLORIDE 0.9 % IV SOLN
INTRAVENOUS | Status: DC
  Administered 2018-04-26: 01:00:00 via INTRAVENOUS

## 2018-04-26 MED ORDER — ENOXAPARIN SODIUM 40 MG/0.4ML ~~LOC~~ SOLN
40.0000 mg | SUBCUTANEOUS | Status: DC
  Administered 2018-04-26 – 2018-04-28 (×3): 40 mg via SUBCUTANEOUS
  Filled 2018-04-26 (×4): qty 0.4

## 2018-04-26 NOTE — Progress Notes (Signed)
Pt noted to have 23 second run of v-tach; pt sleeping and no complaints noted. On call provider paged to make aware. HR back down to 90's, low 100's at this time.

## 2018-04-26 NOTE — Plan of Care (Signed)
Called regarding patient's ABG with CO2 of 73.5. Discussed with patient poor overall baseline lungs and concern he would be ventilator dependent for quite a while if intubated. He confirms he does not want intubation if respiratory status worsens, he also does not want to be resuscitated.  His code status has been changed to DNR/DNI. We will avoid NIPPV given COVID risk. He would like to see how he responds with oxygen but wants to focus on pain control and is considering transition to comfort care if no meaningful improvement in 24 hours.  Will add morphine for air hunger.   [facility] hospitalists

## 2018-04-26 NOTE — ED Notes (Signed)
Checked CBG 141, RN Bobby informed

## 2018-04-26 NOTE — ED Notes (Signed)
ED TO INPATIENT HANDOFF REPORT  ED Nurse Name and Phone #:  Clydene Laming [phone]  S Name/Age/Gender Taylor [name] 63 y.o. male Room/Bed: 029C/029C  Code Status   Code Status: Full Code  Home/SNF/Other Home {Patient oriented to: Person/Time / Place / Situation  Is this baseline? Yes  Triage Complete: Triage complete  Chief Complaint SOB  Triage Note Pt arrives via ems from home with increased sob over the last few days. Hx COPD. 10 puffs on albuterol pta as well as 5mg  en route. 125mg  solumedrol and 2grams magnesium given en route. Pt placed on cpap until arrival to the ed where he was placed on NRB @ 15L. Pt with apparent work of breathing. Audible wheezing. 177/115, HR 120, RR 28-36, 100% NRB.    Allergies Allergies  Allergen Reactions  . Codeine Nausea And Vomiting    Level of Care/Admitting Diagnosis ED Disposition    ED Disposition Condition [facility]: [place] [[id number]]  Level of Care: Telemetry Medical [104]  Diagnosis: COPD exacerbation [facility]) [[id number]]  Admitting Physician: [name] [[id number]]  Attending Physician: [name] [phone]  Estimated length of stay: past midnight tomorrow  Certification:: I certify this patient will need inpatient services for at least 2 midnights  Bed request comments: covid19 rule out, high risk  PT Class (Do Not Modify): Inpatient [101]  PT Acc Code (Do Not Modify): Private [1]       B Medical/Surgery History Past Medical History:  Diagnosis Date  . Acute on chronic respiratory failure ([place])   . Asthma   . Bipolar 1 disorder ([place]) 02/05/2012  . Colon cancer ([place]) 04/11/11   adenocarcinoma of colon, 7/19 nodes pos.FINISHED CHEMO/DR. SHERRILL  . COPD (chronic obstructive pulmonary disease) ([place])    SMOKER  . Depression   . Dyspnea   . Emphysema of lung ([place])   . Full dentures   . Hemorrhoids   . On home oxygen therapy    "3L; 24/7" (05/29/2017)  . Oxygen deficiency   . Pneumonia  ~ 2016   "double pneumonia"  . Prostate cancer ([place])    Gleason score = 7, supposed to have radiation therapy but he has not followed up (05/29/2017)  . Rib fractures    hx of   Past Surgical History:  Procedure Laterality Date  . COLON SURGERY  04/11/11   Sigmoid colectomy  . COLOSTOMY REVISION  04/11/2011   Procedure: COLON RESECTION SIGMOID;  Surgeon: [name], MD;  Location: [facility];  Service: General;  Laterality: N/A;  low anterior colon resection   . GOLD SEED IMPLANT N/A 02/24/2018   Procedure: GOLD SEED IMPLANT;  Surgeon: [name], MD;  Location: [facility];  Service: Urology;  Laterality: N/A;  . HERNIA REPAIR    . INGUINAL HERNIA REPAIR Right 05/01/2012   Procedure: HERNIA REPAIR INGUINAL ADULT;  Surgeon: [name], MD;  Location: [facility];  Service: General;  Laterality: Right;  . INSERTION OF MESH Right 05/01/2012   Procedure: INSERTION OF MESH;  Surgeon: [name], MD;  Location: [facility];  Service: General;  Laterality: Right;  . PORT-A-CATH REMOVAL Left 05/01/2012   Procedure: REMOVAL Infusion Port;  Surgeon: [name], MD;  Location: [facility];  Service: General;  Laterality: Left;  . PORTACATH PLACEMENT  05/02/2011   Procedure: INSERTION PORT-A-CATH;  Surgeon: [name], MD;  Location: [facility];  Service: General;  Laterality: N/A;  . PROSTATE BIOPSY    .  SPACE OAR INSTILLATION N/A 02/24/2018   Procedure: SPACE OAR INSTILLATION;  Surgeon: [name], MD;  Location: [facility];  Service: Urology;  Laterality: N/A;     A IV Location/Drains/Wounds Patient Lines/Drains/Airways Status   Active Line/Drains/Airways    Name:   Placement date:   Placement time:   Site:   Days:   Peripheral IV 04/25/18 Left;Upper Arm   04/25/18    2013    Arm   1          Intake/Output Last 24 hours No intake or output data in the 24 hours ending 04/26/18 0155  Labs/Imaging Results for orders placed or performed during the hospital encounter of 04/25/18 (from the past 48 hour(s))   Basic metabolic panel     Status: Abnormal   Collection Time: 04/25/18  8:22 PM  Result Value Ref Range   Sodium 129 (L) 135 - 145 mmol/L   Potassium 4.7 3.5 - 5.1 mmol/L   Chloride 84 (L) 98 - 111 mmol/L   CO2 33 (H) 22 - 32 mmol/L   Glucose, Bld 139 (H) 70 - 99 mg/dL   BUN 17 8 - 23 mg/dL   Creatinine, Ser 0.70 0.61 - 1.24 mg/dL   Calcium 8.7 (L) 8.9 - 10.3 mg/dL   GFR calc non Af Amer >60 >60 mL/min   GFR calc Af Amer >60 >60 mL/min   Anion gap 12 5 - 15    Comment: Performed at [facility], [place] [address], [place], [place] [address]  CBC with Differential/Platelet     Status: Abnormal   Collection Time: 04/25/18  8:22 PM  Result Value Ref Range   WBC 5.7 4.0 - 10.5 K/uL   RBC 4.24 4.22 - 5.81 MIL/uL   Hemoglobin 13.0 13.0 - 17.0 g/dL   HCT 40.1 39.0 - 52.0 %   MCV 94.6 80.0 - 100.0 fL   MCH 30.7 26.0 - 34.0 pg   MCHC 32.4 30.0 - 36.0 g/dL   RDW 14.0 11.5 - 15.5 %   Platelets 254 150 - 400 K/uL   nRBC 0.0 0.0 - 0.2 %   Neutrophils Relative % 78 %   Neutro Abs 4.4 1.7 - 7.7 K/uL   Lymphocytes Relative 10 %   Lymphs Abs 0.6 (L) 0.7 - 4.0 K/uL   Monocytes Relative 10 %   Monocytes Absolute 0.5 0.1 - 1.0 K/uL   Eosinophils Relative 0 %   Eosinophils Absolute 0.0 0.0 - 0.5 K/uL   Basophils Relative 0 %   Basophils Absolute 0.0 0.0 - 0.1 K/uL   Immature Granulocytes 2 %   Abs Immature Granulocytes 0.11 (H) 0.00 - 0.07 K/uL    Comment: Performed at [facility] [address], [place], [place] [address]  Urinalysis, Routine w reflex microscopic     Status: Abnormal   Collection Time: 04/25/18 11:45 PM  Result Value Ref Range   Color, Urine YELLOW YELLOW   APPearance CLEAR CLEAR   Specific Gravity, Urine 1.014 1.005 - 1.030   pH 6.0 5.0 - 8.0   Glucose, UA NEGATIVE NEGATIVE mg/dL   Hgb urine dipstick NEGATIVE NEGATIVE   Bilirubin Urine NEGATIVE NEGATIVE   Ketones, ur 20 (A) NEGATIVE mg/dL   Protein, ur 30 (A) NEGATIVE mg/dL   Nitrite NEGATIVE NEGATIVE    Leukocytes,Ua NEGATIVE NEGATIVE   RBC / HPF 0-5 0 - 5 RBC/hpf   WBC, UA 0-5 0 - 5 WBC/hpf   Bacteria, UA NONE SEEN NONE  SEEN   Squamous Epithelial / LPF 0-5 0 - 5   Mucus PRESENT    Hyaline Casts, UA PRESENT     Comment: Performed at [facility], [place] [address], [place], [place] [address]  Influenza panel by PCR (type A & B)     Status: None   Collection Time: 04/26/18 12:39 AM  Result Value Ref Range   Influenza A By PCR NEGATIVE NEGATIVE   Influenza B By PCR NEGATIVE NEGATIVE    Comment: (NOTE) The Xpert Xpress Flu assay is intended as an aid in the diagnosis of  influenza and should not be used as a sole basis for treatment.  This  assay is FDA approved for nasopharyngeal swab specimens only. Nasal  washings and aspirates are unacceptable for Xpert Xpress Flu testing. Performed at [facility], [place] [address], [place], [place] [address]   CBG monitoring, ED     Status: Abnormal   Collection Time: 04/26/18 12:40 AM  Result Value Ref Range   Glucose-Capillary 141 (H) 70 - 99 mg/dL   Comment 1 Notify RN    Dg Chest Port 1 View  Result Date: 04/25/2018 CLINICAL DATA:  Shortness of breath. EXAM: PORTABLE CHEST 1 VIEW COMPARISON:  April 23, 2018 FINDINGS: Healed right rib fractures are noted. The heart, hila, and mediastinum are normal. Hyperinflation of the lungs is identified. No nodules, masses, or focal infiltrates. IMPRESSION: COPD.  No acute abnormalities. Electronically Signed   By: Dorise Bullion III M.D   On: 04/25/2018 21:11    Pending Labs Unresulted Labs (From admission, onward)    Start     Ordered   04/26/18 0500  TSH  Tomorrow morning,   R     04/26/18 0012   04/26/18 0149  Novel Coronavirus, NAA (hospital order; send-out to ref lab)  (Novel Coronavirus, NAA [facility] Order; send-out to ref lab) with precautions panel)  Once,   R    Comments:  Cough and SOB   Question Answer Comment  Current symptoms Other (testing not indicated)   Patient immune  status Normal      04/26/18 0148   04/26/18 0015  Culture, sputum-assessment  Once,   R     04/26/18 0014   04/26/18 0013  HIV antibody  Once,   R     04/26/18 0014   04/26/18 0012  Respiratory Panel by PCR  (Respiratory virus panel with precautions)  Once,   R     04/26/18 0011   04/25/18 2351  Urine culture  ONCE - STAT,   STAT     04/25/18 2351          Vitals/Pain Today's Vitals   04/26/18 0045 04/26/18 0059 04/26/18 0116 04/26/18 0130  BP: (!) 117/92  106/72 111/78  Pulse: (!) 102  (!) 106 84  Resp: 20  16 20   Temp:      TempSrc:      SpO2: 99%  100% 99%  Weight:      Height:      PainSc:  0-No pain 0-No pain Asleep    Isolation Precautions Droplet and Contact precautions  Medications Medications  sulfamethoxazole-trimethoprim (BACTRIM DS,SEPTRA DS) 800-160 MG per tablet 1 tablet (1 tablet Oral Given 04/26/18 0036)  pravastatin (PRAVACHOL) tablet 20 mg (has no administration in time range)  ARIPiprazole (ABILIFY) tablet 5 mg (has no administration in time range)  escitalopram (LEXAPRO) tablet 20 mg (has no administration in time range)  loperamide (IMODIUM) capsule  2 mg (has no administration in time range)  tamsulosin (FLOMAX) capsule 0.4 mg (has no administration in time range)  mometasone-formoterol (DULERA) 100-5 MCG/ACT inhaler 2 puff (2 puffs Inhalation Not Given 04/26/18 0047)  dextromethorphan-guaiFENesin (MUCINEX DM) 30-600 MG per 12 hr tablet 1 tablet (1 tablet Oral Given 04/26/18 0036)  0.9 %  sodium chloride infusion ( Intravenous New Bag/Given 04/26/18 0051)  insulin aspart (novoLOG) injection 0-9 Units (has no administration in time range)  insulin aspart (novoLOG) injection 0-5 Units (0 Units Subcutaneous Not Given 04/26/18 0047)  enoxaparin (LOVENOX) injection 40 mg (has no administration in time range)  levalbuterol (XOPENEX HFA) inhaler 2 puff (has no administration in time range)  ipratropium (ATROVENT HFA) inhaler 2 puff (has no administration in time  range)    Mobility walks Low fall risk   Focused Assessments 4 LPM/    R Recommendations: See Admitting Provider Note  Report given to:   Additional Notes: No  Family present .

## 2018-04-26 NOTE — Progress Notes (Signed)
Pt CBG at 12 PM is  66 RN administered apple juice, will reassess

## 2018-04-26 NOTE — Plan of Care (Signed)
°  Problem: Coping: °Goal: Level of anxiety will decrease °Outcome: Progressing °  °

## 2018-04-26 NOTE — Plan of Care (Signed)
Pt is progressing toward desired goal for this admission. 

## 2018-04-26 NOTE — Progress Notes (Addendum)
[facility] PROGRESS NOTE  DHILAN [name] XYV:[id number] DOB: 1955/05/21 DOA: 04/25/2018 PCP: Clent Demark, PA-C  Assessment/Plan: Acute on chronic Respiratory failure presumed secondary to COPD exacerbation but high concern for COVID infection. Typically on 2-3 L at home but currently on 4 L with increased accessory muscle use. CXR with no opacities, has no fever,no LFT derangements but does have absolute lymphopenia, without known COVID contacts albeit he was recently admitted in late march and in ED on 4/2.  Viral etiologies have been negative so far on flu panel and RVP, concerning suspicion for COVID given increased O2 requirements and worsening respiratory status, continue airborne/contact precautions. Will consult PCCM as he is high risk for needing further respiratory support and while ruling out COVID avoiding NIPPV. He does have abdominal distension addressed below that could be likely contributing to his increased work of breathing. For COPD continue scheduled inhalers, IS, mucinex Addendum: Doing much better with less work of breathing/anxiety with Foley placed and 800 cc removed.  We will continue to monitor respiratory status, discussed with PCCM patient's likely poor overall prognosis if potential intubation needed given active cancer undergoing radiation therapy  Abdominal distension. Has history of bladder cancer and recent radiation and has been without BM and urination for several days. Rule out obstruction with Stat abd xr, getting bladder scan and placing foley in case this is acute urinary retention all of which could be contributing to worsening respiratory status. Addendum stat abdominal x-ray showed no acute obstruction  Prostate cancer. Last radiation therapy several days ago.  Will need to get further information about severity of cancer.  Diminished urination. UA negative for infection. Bladder scan, place foley, concern for urinary retention  T2DM,  controlled. A1c 7.7. holding home metformin, SSI, CBG monitoring  Depression Anxious right now. Continue home abilify and lexapro  HLD. Stable. Home pravastatin  Alcohol abuse. States last drink several months ago. Monitor closely for signs of withdrawal  Acute hyponatremia 129 on admission, typically in 135-138. Not obviously volume up or down on exam, does admit decrease oral intake as well as diminished output. Timed IVF given concern for COVID which increases risk for ARDS  Depressed TSH Check T3 and T4.     Code Status: FULL CODE Family Communication:  No family at bedside (indicate person spoken with, relationship, and if by phone, the number) Disposition Plan: PCCM consult given concern for worsening respiratory status, abd xr, foley, bladder scan, airborne/contact precuations   Consultants:  PCCM  Procedures:  none  Antibiotics:  none (indicate start date, and stop date if known)  HPI/Subjective:  Taylor [name] is a 63 y.o. year old male with medical history significant for COPD on 2 L, HLD, T2DM, biplar disorder, colon cancer, prostate cancer currently receiving radiation therapy, alcohol abuse who presented on 04/25/2018 with worsening shortness of breath, decreased urination and BM with dysuria. EMS gave Solumdrol, Mg, and albuterol followed by 15 L on NRB en route to ED before being admitted to [facility] on 5 L with presumed COPD exacerbation given unrevealing CXR and high oxygen requirements. He is on airbone/contact isolation while ruling out COVID.  Of note, he was last in hospital during ED visit on 4/2 for presumed COPD exacerbation  States no BM or urination for days Feels he can't breathe Very anxious  Objective: Vitals:   04/26/18 0258 04/26/18 0330  BP: 128/87   Pulse: (!) 126 (!) 101  Resp: (!) 22 (!) 21  Temp: 98.2 F (  36.8 C)   SpO2: 99%     Intake/Output Summary (Last 24 hours) at 04/26/2018 0757 Last data filed at  04/26/2018 0650 Gross per 24 hour  Intake 568.03 ml  Output 100 ml  Net 468.03 ml   Filed Weights   04/25/18 2011 04/26/18 0258  Weight: 79 kg 75.8 kg    Exam:   General:  Lying in bed, uncomfortable, no acute distress  Cardiovascular: no edema  Respiratory: 4 L increased respiratory effort/work of breathing, use of SCM accessory muscles, diffuse wheezing  Abdomen: soft, tender with deep palpation, no rebound tenderness, diminished bowel sounds  Musculoskeletal: normal range of motion   Skin no rashes or ulcers  Neurologic alert and oriented x4, no focal deficits  Data Reviewed: Basic Metabolic Panel: Recent Labs  Lab 04/23/18 1442 04/25/18 2022  NA 132* 129*  K 4.7 4.7  CL 90* 84*  CO2 35* 33*  GLUCOSE 125* 139*  BUN 14 17  CREATININE 0.65 0.70  CALCIUM 8.2* 8.7*   Liver Function Tests: Recent Labs  Lab 04/26/18 0212  AST 31  ALT 25  ALKPHOS 41  BILITOT 0.5  PROT 6.1*  ALBUMIN 3.9   No results for input(s): LIPASE, AMYLASE in the last 168 hours. No results for input(s): AMMONIA in the last 168 hours. CBC: Recent Labs  Lab 04/23/18 1442 04/25/18 2022  WBC 5.1 5.7  NEUTROABS 4.6 4.4  HGB 11.6* 13.0  HCT 35.1* 40.1  MCV 96.4 94.6  PLT 205 254   Cardiac Enzymes: No results for input(s): CKTOTAL, CKMB, CKMBINDEX, TROPONINI in the last 168 hours. BNP (last 3 results) Recent Labs    01/17/18 1454 03/26/18 1425 04/14/18 1718  BNP 26.3 24.2 33.1    ProBNP (last 3 results) No results for input(s): PROBNP in the last 8760 hours.  CBG: Recent Labs  Lab 04/26/18 0040  GLUCAP 141*    Recent Results (from the past 240 hour(s))  Respiratory Panel by PCR     Status: None   Collection Time: 04/26/18 12:39 AM  Result Value Ref Range Status   Adenovirus NOT DETECTED NOT DETECTED Final   Coronavirus 229E NOT DETECTED NOT DETECTED Final    Comment: (NOTE) The Coronavirus on the Respiratory Panel, DOES NOT test for the novel  Coronavirus (2019  nCoV)    Coronavirus HKU1 NOT DETECTED NOT DETECTED Final   Coronavirus NL63 NOT DETECTED NOT DETECTED Final   Coronavirus OC43 NOT DETECTED NOT DETECTED Final   Metapneumovirus NOT DETECTED NOT DETECTED Final   Rhinovirus / Enterovirus NOT DETECTED NOT DETECTED Final   Influenza A NOT DETECTED NOT DETECTED Final   Influenza B NOT DETECTED NOT DETECTED Final   Parainfluenza Virus 1 NOT DETECTED NOT DETECTED Final   Parainfluenza Virus 2 NOT DETECTED NOT DETECTED Final   Parainfluenza Virus 3 NOT DETECTED NOT DETECTED Final   Parainfluenza Virus 4 NOT DETECTED NOT DETECTED Final   Respiratory Syncytial Virus NOT DETECTED NOT DETECTED Final   Bordetella pertussis NOT DETECTED NOT DETECTED Final   Chlamydophila pneumoniae NOT DETECTED NOT DETECTED Final   Mycoplasma pneumoniae NOT DETECTED NOT DETECTED Final    Comment: Performed at [facility], [name], [place], [place] [address]     Studies: Dg Chest Port 1 View  Result Date: 04/25/2018 CLINICAL DATA:  Shortness of breath. EXAM: PORTABLE CHEST 1 VIEW COMPARISON:  April 23, 2018 FINDINGS: Healed right rib fractures are noted. The heart, hila, and mediastinum are normal. Hyperinflation of  the lungs is identified. No nodules, masses, or focal infiltrates. IMPRESSION: COPD.  No acute abnormalities. Electronically Signed   By: Dorise Bullion III M.D   On: 04/25/2018 21:11    Scheduled Meds: . ARIPiprazole  5 mg Oral Daily  . enoxaparin (LOVENOX) injection  40 mg Subcutaneous Q24H  . escitalopram  20 mg Oral Daily  . insulin aspart  0-5 Units Subcutaneous QHS  . insulin aspart  0-9 Units Subcutaneous TID WC  . ipratropium  2 puff Inhalation Q6H  . levalbuterol  2 puff Inhalation Q6H  . mometasone-formoterol  2 puff Inhalation BID  . pravastatin  20 mg Oral q1800  . sulfamethoxazole-trimethoprim  1 tablet Oral BID   Continuous Infusions: . sodium chloride 75 mL/hr at 04/26/18 9166    Principal Problem:   Acute on  chronic respiratory failure with hypoxia (HCC) Active Problems:   Depression   Alcohol abuse   COPD exacerbation ([facility])   HLD (hyperlipidemia)   Prostate cancer ([place])   Diabetes mellitus without complication ([place])   Hyponatremia      Zyrus Hetland D Brizeyda Holtmeyer  [facility]

## 2018-04-27 ENCOUNTER — Ambulatory Visit: Payer: Medicaid Other

## 2018-04-27 DIAGNOSIS — J9692 Respiratory failure, unspecified with hypercapnia: Secondary | ICD-10-CM

## 2018-04-27 DIAGNOSIS — R338 Other retention of urine: Secondary | ICD-10-CM

## 2018-04-27 DIAGNOSIS — J9691 Respiratory failure, unspecified with hypoxia: Secondary | ICD-10-CM | POA: Diagnosis present

## 2018-04-27 LAB — NOVEL CORONAVIRUS, NAA (HOSP ORDER, SEND-OUT TO REF LAB; TAT 18-24 HRS): SARS-CoV-2, NAA: NOT DETECTED

## 2018-04-27 LAB — T4: T4, Total: 5.7 ug/dL (ref 4.5–12.0)

## 2018-04-27 LAB — COMPREHENSIVE METABOLIC PANEL
ALT: 20 U/L (ref 0–44)
AST: 23 U/L (ref 15–41)
Albumin: 3.3 g/dL — ABNORMAL LOW (ref 3.5–5.0)
Alkaline Phosphatase: 39 U/L (ref 38–126)
Anion gap: 8 (ref 5–15)
BUN: 14 mg/dL (ref 8–23)
CO2: 36 mmol/L — ABNORMAL HIGH (ref 22–32)
Calcium: 7.9 mg/dL — ABNORMAL LOW (ref 8.9–10.3)
Chloride: 86 mmol/L — ABNORMAL LOW (ref 98–111)
Creatinine, Ser: 0.65 mg/dL (ref 0.61–1.24)
GFR calc Af Amer: >60 mL/min (ref >60)
GFR calc non Af Amer: >60 mL/min (ref >60)
Glucose, Bld: 124 mg/dL — ABNORMAL HIGH (ref 70–99)
Potassium: 3.9 mmol/L (ref 3.5–5.1)
Sodium: 130 mmol/L — ABNORMAL LOW (ref 135–145)
Total Bilirubin: 0.2 mg/dL — ABNORMAL LOW (ref 0.3–1.2)
Total Protein: 4.8 g/dL — ABNORMAL LOW (ref 6.5–8.1)

## 2018-04-27 LAB — URINE CULTURE: Culture: <10000 — AB

## 2018-04-27 LAB — GLUCOSE, CAPILLARY
Glucose-Capillary: 123 mg/dL — ABNORMAL HIGH (ref 70–99)
Glucose-Capillary: 173 mg/dL — ABNORMAL HIGH (ref 70–99)
Glucose-Capillary: 190 mg/dL — ABNORMAL HIGH (ref 70–99)
Glucose-Capillary: 203 mg/dL — ABNORMAL HIGH (ref 70–99)

## 2018-04-27 LAB — LEGIONELLA PNEUMOPHILA SEROGP 1 UR AG: L. pneumophila Serogp 1 Ur Ag: NEGATIVE

## 2018-04-27 LAB — T3, FREE: T3, Free: 1.8 pg/mL — ABNORMAL LOW (ref 2.0–4.4)

## 2018-04-27 MED ORDER — IPRATROPIUM BROMIDE HFA 17 MCG/ACT IN AERS
2.0000 | INHALATION_SPRAY | Freq: Three times a day (TID) | RESPIRATORY_TRACT | Status: DC
  Administered 2018-04-28 – 2018-04-29 (×4): 2 via RESPIRATORY_TRACT

## 2018-04-27 MED ORDER — PREDNISONE 20 MG PO TABS
40.0000 mg | ORAL_TABLET | Freq: Every day | ORAL | Status: DC
  Administered 2018-04-27: 40 mg via ORAL
  Filled 2018-04-27: qty 2

## 2018-04-27 MED ORDER — ALUM & MAG HYDROXIDE-SIMETH 200-200-20 MG/5ML PO SUSP
15.0000 mL | ORAL | Status: DC | PRN
  Administered 2018-04-27: 15 mL via ORAL
  Filled 2018-04-27: qty 30

## 2018-04-27 MED ORDER — METHYLPREDNISOLONE SODIUM SUCC 125 MG IJ SOLR
60.0000 mg | Freq: Two times a day (BID) | INTRAMUSCULAR | Status: DC
  Administered 2018-04-27 (×2): 60 mg via INTRAVENOUS
  Filled 2018-04-27 (×2): qty 2

## 2018-04-27 MED ORDER — LACTULOSE 10 GM/15ML PO SOLN
10.0000 g | Freq: Once | ORAL | Status: AC
  Administered 2018-04-27: 10 g via ORAL
  Filled 2018-04-27: qty 15

## 2018-04-27 MED ORDER — SENNOSIDES-DOCUSATE SODIUM 8.6-50 MG PO TABS
2.0000 | ORAL_TABLET | Freq: Two times a day (BID) | ORAL | Status: DC
  Administered 2018-04-27 – 2018-04-28 (×3): 2 via ORAL
  Filled 2018-04-27 (×5): qty 2

## 2018-04-27 MED ORDER — POLYETHYLENE GLYCOL 3350 17 G PO PACK
17.0000 g | PACK | Freq: Two times a day (BID) | ORAL | Status: DC
  Administered 2018-04-27 – 2018-04-28 (×3): 17 g via ORAL
  Filled 2018-04-27 (×5): qty 1

## 2018-04-27 MED ORDER — BISACODYL 10 MG RE SUPP
10.0000 mg | Freq: Once | RECTAL | Status: AC
  Administered 2018-04-27: 10 mg via RECTAL
  Filled 2018-04-27: qty 1

## 2018-04-27 MED ORDER — LEVALBUTEROL TARTRATE 45 MCG/ACT IN AERO
2.0000 | INHALATION_SPRAY | Freq: Three times a day (TID) | RESPIRATORY_TRACT | Status: DC
  Administered 2018-04-28 – 2018-04-29 (×4): 2 via RESPIRATORY_TRACT
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
## 2018-04-27 NOTE — Progress Notes (Addendum)
[facility] PROGRESS NOTE  Taylor [name] FFM:[id number] DOB: 24-Nov-1955 DOA: 04/25/2018 PCP: Taylor Demark, PA-C  Assessment/Plan: Acute on chronic hypercarbic and hypoxic respiratory failure presumed secondary to COPD exacerbation, stable typically on 2 to 3 L, was able to wean down to 3 L this morning,  no longer having increased work of breathing or accessory muscle use that is improved with improvement in abdominal distention which was likely a result of his acute urinary retention.  The patient still requiring 4 L doubt COVID infection, PCR was negative additionally risk assessment labs are unremarkable (negative flu panel, negative RVP, unremarkable ferritin, LDH, CRP, d-dimer, troponin) will transfer patient off floor initiate NIPPV given hypercarbic on most recent ABG and symptoms most consistent with COPD flare.  Of note patient does not want to be intubated respiratory status worsens.  Added prednisone this morning, will increase to IV Solu-Medrol given negative COVID results.continue scheduled inhalers, mucinex, Incentive spirometry, flutter valve  Abdominal distension likely from increased stool burden and acute urinary retention, improving.  Significant improvement after initiation of Foley catheter likely consistent with acute urinary retention.  Also decreased bowel movements over several days, optimize bowel regimen.  CT abdomen shows moderate stool burden,And fluid collection Rectal/prostate that likely favors posttherapy changes, will closely monitor for any worsening pain/fever in case this represents prostatic abscess  Prostate cancer. Last radiation therapy several days ago.  Plans to resume radiation after hospital discharge  Acute urinary retention, likely secondary to known prostate cancer.  Doing well with Foley catheter, closely monitor output. UA negative for infection.  Will need voiding trial prior to discharge.  continue Flomax and chronic bactrim.  T2DM,  controlled. A1c 7.7. holding home metformin, SSI, CBG monitoring especially while on IV steroids  Depression Stable. Continue home abilify and lexapro  HLD. Stable. Home pravastatin  Alcohol abuse. States last drink several months ago. Monitor closely for signs of withdrawal  Acute hyponatremia 129 on admission, typically in 135-138, currently 130. Not obviously volume up or down on exam, does admit decrease oral intake as well as diminished output.  Completed timed IVF  Depressed TSH T4 normal, T3 slightly down.  Likely euthyroid sick syndrome.  Will need repeat TSH/T3/T4 as outpatient    Code Status: FULL CODE Family Communication:  No family at bedside (indicate person spoken with, relationship, and if by phone, the number) Disposition Plan: Transfer off to [place] given negative COVID and more consistent with COPD flare, recommend using NIPPV for hypercarbic component of respiratory failure, continue IV steroids, monitor urine output,   Consultants:  PCCM  Procedures:  none  Antibiotics:  none (indicate start date, and stop date if known)  HPI/Subjective:  Taylor [name] is a 63 y.o. year old male with medical history significant for COPD on 2 L, HLD, T2DM, biplar disorder, colon cancer, prostate cancer currently receiving radiation therapy, alcohol abuse who presented on 04/25/2018 with worsening shortness of breath, decreased urination and BM with dysuria. EMS gave Solumdrol, Mg, and albuterol followed by 15 L on NRB en route to ED before being admitted to [facility] on 5 L with presumed COPD exacerbation given unrevealing CXR and high oxygen requirements. He is on airbone/contact isolation while ruling out COVID.  Of note, he was last in hospital during ED visit on 4/2 for presumed COPD exacerbation  States no BM or urination for days Feels he can't breathe Very anxious  Objective: Vitals:   04/27/18 0733 04/27/18 0802  BP:  99/70  Pulse:  92  Resp:     Temp:  98 F (36.7 C)  SpO2: 98% 99%    Intake/Output Summary (Last 24 hours) at 04/27/2018 1032 Last data filed at 04/27/2018 0831 Gross per 24 hour  Intake 120 ml  Output 1575 ml  Net -1455 ml   Filed Weights   04/25/18 2011 04/26/18 0258  Weight: 79 kg 75.8 kg    Exam:   General:  Lying in bed,  no acute distress  Cardiovascular: no edema  Respiratory: 3 L, diffuse wheezing, no accessory muscle usage, able to speak in complete sentences  Abdomen: soft, distended, nontender with deep palpation, diminished bowel sounds, no rebound tenderness, diminished bowel sounds  Musculoskeletal: normal range of motion   Skin no rashes or ulcers  Neurologic alert and oriented x4, no focal deficits  Data Reviewed: Basic Metabolic Panel: Recent Labs  Lab 04/23/18 1442 04/25/18 2022 04/26/18 0823 04/27/18 0634  NA 132* 129* 132* 130*  K 4.7 4.7 4.7 3.9  CL 90* 84* 91* 86*  CO2 35* 33* 30 36*  GLUCOSE 125* 139* 110* 124*  BUN 14 17 18 14   CREATININE 0.65 0.70 0.75 0.65  CALCIUM 8.2* 8.7* 8.3* 7.9*   Liver Function Tests: Recent Labs  Lab 04/26/18 0212 04/26/18 0823 04/27/18 0634  AST 31 30 23   ALT 25 27 20   ALKPHOS 41 43 39  BILITOT 0.5 0.5 0.2*  PROT 6.1* 6.2* 4.8*  ALBUMIN 3.9 4.1 3.3*   No results for input(s): LIPASE, AMYLASE in the last 168 hours. No results for input(s): AMMONIA in the last 168 hours. CBC: Recent Labs  Lab 04/23/18 1442 04/25/18 2022 04/26/18 0823  WBC 5.1 5.7 6.1  NEUTROABS 4.6 4.4 4.9  HGB 11.6* 13.0 11.9*  HCT 35.1* 40.1 35.9*  MCV 96.4 94.6 94.0  PLT 205 254 249   Cardiac Enzymes: Recent Labs  Lab 04/26/18 1152  TROPONINI <0.03   BNP (last 3 results) Recent Labs    01/17/18 1454 03/26/18 1425 04/14/18 1718  BNP 26.3 24.2 33.1    ProBNP (last 3 results) No results for input(s): PROBNP in the last 8760 hours.  CBG: Recent Labs  Lab 04/26/18 0956 04/26/18 1224 04/26/18 1714 04/26/18 2157 04/27/18 0801   GLUCAP 66* 84 85 107* 123*    Recent Results (from the past 240 hour(s))  Urine culture     Status: Abnormal   Collection Time: 04/25/18 11:45 PM  Result Value Ref Range Status   Specimen Description URINE, CLEAN CATCH  Final   Special Requests NONE  Final   Culture (A)  Final    <10,000 COLONIES/mL INSIGNIFICANT GROWTH Performed at [facility] [address] [place], [place] [address]    Report Status 04/27/2018 FINAL  Final  Respiratory Panel by PCR     Status: None   Collection Time: 04/26/18 12:39 AM  Result Value Ref Range Status   Adenovirus NOT DETECTED NOT DETECTED Final   Coronavirus 229E NOT DETECTED NOT DETECTED Final    Comment: (NOTE) The Coronavirus on the Respiratory Panel, DOES NOT test for the novel  Coronavirus (2019 nCoV)    Coronavirus HKU1 NOT DETECTED NOT DETECTED Final   Coronavirus NL63 NOT DETECTED NOT DETECTED Final   Coronavirus OC43 NOT DETECTED NOT DETECTED Final   Metapneumovirus NOT DETECTED NOT DETECTED Final   Rhinovirus / Enterovirus NOT DETECTED NOT DETECTED Final   Influenza A NOT DETECTED NOT DETECTED Final   Influenza B NOT DETECTED NOT  DETECTED Final   Parainfluenza Virus 1 NOT DETECTED NOT DETECTED Final   Parainfluenza Virus 2 NOT DETECTED NOT DETECTED Final   Parainfluenza Virus 3 NOT DETECTED NOT DETECTED Final   Parainfluenza Virus 4 NOT DETECTED NOT DETECTED Final   Respiratory Syncytial Virus NOT DETECTED NOT DETECTED Final   Bordetella pertussis NOT DETECTED NOT DETECTED Final   Chlamydophila pneumoniae NOT DETECTED NOT DETECTED Final   Mycoplasma pneumoniae NOT DETECTED NOT DETECTED Final    Comment: Performed at [facility], [place] [address], [place], [place] [address]  Novel Coronavirus, NAA (hospital order; send-out to ref lab)     Status: None   Collection Time: 04/26/18 12:39 AM  Result Value Ref Range Status   SARS-CoV-2, NAA NOT DETECTED NOT DETECTED Final    Comment: Performed at [facility]  Final    Comment: Performed at [facility], [place] [address], [place], [place] [address]     Studies: Ct Abdomen Pelvis Wo Contrast  Result Date: 04/26/2018 CLINICAL DATA:  63 year old male with worsening abdominal pain and history of prostate carcinoma EXAM: CT ABDOMEN AND PELVIS WITHOUT CONTRAST TECHNIQUE: Multidetector CT imaging of the abdomen and pelvis was performed following the standard protocol without IV contrast. COMPARISON:  01/17/2018 FINDINGS: Lower chest: Emphysema without acute finding of the lower chest. Hepatobiliary: Unremarkable appearance of the liver. Unremarkable gallbladder Pancreas: Unremarkable pancreas Spleen: Unremarkable spleen Adrenals/Urinary Tract: Unremarkable adrenal glands. Bilateral kidneys demonstrate no hydronephrosis. No nephrolithiasis. Unremarkable course the bilateral ureters. Cystic structure posterior cortex of the right kidney, unchanged. Other kidney cysts are not as well characterized on the current CT. Urinary bladder mostly decompressed. Balloon retention catheter in place. Gas at the anti dependent aspect of the urinary bladder. Stomach/Bowel: Hiatal hernia. Otherwise unremarkable stomach. Unremarkable small bowel with no abnormal distention. No transition point. Normal appendix. Mild stool burden. Colonic diverticular disease without associated inflammation. Vascular/Lymphatic: Calcifications of the aorta. Greatest diameter of the infrarenal abdominal aorta measures 2.4 cm. Calcifications bilateral iliac arteries. No adenopathy. Reproductive: Lentiform low-density fluid collection in the rectovesical space. Fluid is inseparable from rectum and the posterior aspect of the prostate. Greatest diameter on the axial images measures 1.9 cm. Greatest diameter on the parasagittal images measures 4.6 cm. Other: Small foci of gas within the anterior abdominal wall with mild infiltration, likely injection granulomas.  Musculoskeletal: No displaced fracture. Degenerative changes of the spine. The changes of the hips. IMPRESSION: There is a low-density lentiform fluid collection in the rectovesical space, inseparable from the rectum and prostate. 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 Gas within the urinary bladder is presumably related to manipulation of the catheter in place. If there is concern for gas-forming organism, recommend correlation with urinalysis. Diverticular disease without evidence of acute diverticulitis. Additional ancillary findings as above. Electronically Signed   By: Corrie Mckusick D.O.   On: 04/26/2018 16:05   Dg Chest Port 1 View  Result Date: 04/25/2018 CLINICAL DATA:  Shortness of breath. EXAM: PORTABLE CHEST 1 VIEW COMPARISON:  April 23, 2018 FINDINGS: Healed right rib fractures are noted. The heart, hila, and mediastinum are normal. Hyperinflation of the lungs is identified. No nodules, masses, or focal infiltrates. IMPRESSION: COPD.  No acute abnormalities. Electronically Signed   By: Dorise Bullion III M.D   On: 04/25/2018 21:11   Dg Abd  Portable 2v  Result Date: 04/26/2018 CLINICAL DATA:  Abdominal pain for 2-3 days. Unable to urinate or height bowel movements. EXAM: PORTABLE ABDOMEN - 2 VIEW COMPARISON:  03/08/2018 FINDINGS: There is generalized increased bowel gas, but no bowel dilation to suggest obstruction and no air-fluid levels on the erect view. There is no free air. No significant increase in colonic stool. No evidence of renal or ureteral stones. Soft tissues are unremarkable. Clear lung bases. IMPRESSION: 1. No acute findings.  No evidence of bowel obstruction or free air. 2. Generalized increased bowel gas, nonspecific. Electronically Signed   By: Lajean Manes M.D.   On: 04/26/2018 09:08    Scheduled Meds: . ARIPiprazole  5  mg Oral Daily  . enoxaparin (LOVENOX) injection  40 mg Subcutaneous Q24H  . escitalopram  20 mg Oral Daily  . insulin aspart  0-5 Units Subcutaneous QHS  . insulin aspart  0-9 Units Subcutaneous TID WC  . ipratropium  2 puff Inhalation Q6H  . levalbuterol  2 puff Inhalation Q6H  . mometasone-formoterol  2 puff Inhalation BID  . polyethylene glycol  17 g Oral BID  . pravastatin  20 mg Oral q1800  . predniSONE  40 mg Oral Q breakfast  . senna-docusate  2 tablet Oral BID  . sulfamethoxazole-trimethoprim  1 tablet Oral BID   Continuous Infusions:   Principal Problem:   Acute on chronic respiratory failure with hypoxia (HCC) Active Problems:   Depression   Alcohol abuse   COPD exacerbation (HCC)   HLD (hyperlipidemia)   Prostate cancer (HCC)   Anxiety and depression   Diabetes mellitus without complication (HCC)   Abdominal distention   Respiratory distress   Hyponatremia   Suspected Covid-19 Virus Infection      Desiree Hane  [facility]

## 2018-04-27 NOTE — Progress Notes (Signed)
Notified Baltazar Najjar, NP that pt wants to be full code. Pt states if he has to be intubated then he only wants to be on machine for 1-2 weeks. Will continue to monitor pt. Ranelle Oyster, RN

## 2018-04-27 NOTE — Progress Notes (Signed)
Spoke with Sharyn Lull, RN caring for patient on 2 [place] today. Inquired about patient status and ability to come via [facility] for prostate radiation. Sharyn Lull, RN didn't feel the patient was stable enough for treatment today but asked that we check back tomorrow. Dr. Lonny Prude confirms this. Dr. Tammi Klippel wishes to resume XRT after hospital discharge. Informed radiation team of this finding.

## 2018-04-27 NOTE — Progress Notes (Addendum)
Daily Nursing Note Received report from [place], [place]. Evaluated patient at bedside who endorsed feeling his pain with urination was improving. Abd noted to be distended and taught. Dr. Lonny Prude aware ordered miralax, senna 2 tabs, lactulose, and bisacodyl suppository --> (+)1 medium volume stool. Patient weaned to 3LPM [place]. Received a call from [facility] to identify whether or not patient was stable for radiation today, I stated that it doesn't appear he's appropriate to go today though to call back tomorrow to identify his status then. Patient transfered off floor to 5W given negative novel coronavirus test. All isolation DCd.   Blood sugars --> 123 --> 173  Micro: Novel Coronavirus - Not detected  Dispo: Home when medically optimized.

## 2018-04-28 ENCOUNTER — Ambulatory Visit: Payer: Medicaid Other

## 2018-04-28 LAB — CBC WITH DIFFERENTIAL/PLATELET
Abs Immature Granulocytes: 0.05 10*3/uL (ref 0.00–0.07)
Basophils Absolute: 0 10*3/uL (ref 0.0–0.1)
Basophils Relative: 0 %
Eosinophils Absolute: 0 10*3/uL (ref 0.0–0.5)
Eosinophils Relative: 0 %
HCT: 32.2 % — ABNORMAL LOW (ref 39.0–52.0)
Hemoglobin: 10.7 g/dL — ABNORMAL LOW (ref 13.0–17.0)
Immature Granulocytes: 1 %
Lymphocytes Relative: 3 %
Lymphs Abs: 0.2 10*3/uL — ABNORMAL LOW (ref 0.7–4.0)
MCH: 30.6 pg (ref 26.0–34.0)
MCHC: 33.2 g/dL (ref 30.0–36.0)
MCV: 92 fL (ref 80.0–100.0)
Monocytes Absolute: 0.2 10*3/uL (ref 0.1–1.0)
Monocytes Relative: 3 %
Neutro Abs: 5.8 10*3/uL (ref 1.7–7.7)
Neutrophils Relative %: 93 %
Platelets: 226 10*3/uL (ref 150–400)
RBC: 3.5 MIL/uL — ABNORMAL LOW (ref 4.22–5.81)
RDW: 13.7 % (ref 11.5–15.5)
WBC: 6.2 10*3/uL (ref 4.0–10.5)
nRBC: 0 % (ref 0.0–0.2)

## 2018-04-28 LAB — GLUCOSE, CAPILLARY
Glucose-Capillary: 127 mg/dL — ABNORMAL HIGH (ref 70–99)
Glucose-Capillary: 83 mg/dL (ref 70–99)
Glucose-Capillary: 175 mg/dL — ABNORMAL HIGH (ref 70–99)
Glucose-Capillary: 85 mg/dL (ref 70–99)

## 2018-04-28 MED ORDER — IPRATROPIUM-ALBUTEROL 0.5-2.5 (3) MG/3ML IN SOLN: 3.0000 mL | Freq: Four times a day (QID) | RESPIRATORY_TRACT | Status: DC | PRN

## 2018-04-28 MED ORDER — METHYLPREDNISOLONE SODIUM SUCC 40 MG IJ SOLR
40.0000 mg | Freq: Two times a day (BID) | INTRAMUSCULAR | Status: DC
  Administered 2018-04-28 – 2018-04-29 (×2): 40 mg via INTRAVENOUS
  Filled 2018-04-28 (×2): qty 1

## 2018-04-28 MED ORDER — TAMSULOSIN HCL 0.4 MG PO CAPS
0.4000 mg | ORAL_CAPSULE | Freq: Two times a day (BID) | ORAL | Status: DC
  Administered 2018-04-28 – 2018-04-29 (×3): 0.4 mg via ORAL
  Filled 2018-04-28 (×3): qty 1

## 2018-04-28 MED ORDER — TAMSULOSIN HCL 0.4 MG PO CAPS: 0.4000 mg | ORAL_CAPSULE | Freq: Every day | ORAL | Status: DC

## 2018-04-28 NOTE — Progress Notes (Signed)
PROGRESS NOTE        PATIENT DETAILS Name: Taylor [name] Age: 63 y.o. Sex: male Date of Birth: 1956-01-02 Admit Date: 04/25/2018 Admitting Physician Taylor Costa, MD WCB:JSEGB, Taylor Albee, PA-C  Brief Narrative: Patient is a 63 y.o. male history of COPD on chronic hypoxic respiratory on 3 L of oxygen at home-presented with shortness of breath-thought to have COPD exacerbation.  See below for further details.  Subjective: Feels better-breathing slowly stable.  No chest pain or abdominal pain.  Assessment/Plan: Acute on chronic hypercarbic/hypoxic respiratory failure secondary to COPD exacerbation: Improved-titrating down FiO2 as tolerated.  Start tapering Solu-Medrol-continue bronchodilators.  Encourage incentive spirometry/flutter valve.  Respiratory virus panel and COVID-19 negative.  Acute urinary retention: Change Flomax to twice daily dosing-we will perform voiding trial tomorrow.  Constipation: Resolved  Hyponatremia: Mild-improving.  DM-2: CBG stable-continue SSI  Dyslipidemia: Continue statin  Depression: Continue Abilify and Lexapro  Mildly suppressed TSH/free T3: Likely sick euthyroid syndrome stable for outpatient follow-up.  History of prostate cancer: Stable for outpatient follow-up with urology/oncology  Deconditioning/debility: Likely secondary to acute illness-continue PT eval.    DVT Prophylaxis: Prophylactic Lovenox   Code Status: Full code   Family Communication: None at bedside  Disposition Plan: Remain inpatient  Antimicrobial agents: Anti-infectives (From admission, onward)   Start     Dose/Rate Route Frequency Ordered Stop   04/26/18 0015  sulfamethoxazole-trimethoprim (BACTRIM DS,SEPTRA DS) 800-160 MG per tablet 1 tablet     1 tablet Oral 2 times daily 04/26/18 0009        Procedures: None*  CONSULTS:  None  Time spent: 25- minutes-Greater than 50% of this time was spent in counseling, explanation of  diagnosis, planning of further management, and coordination of care.  MEDICATIONS: Scheduled Meds:  ARIPiprazole  5 mg Oral Daily   enoxaparin (LOVENOX) injection  40 mg Subcutaneous Q24H   escitalopram  20 mg Oral Daily   insulin aspart  0-5 Units Subcutaneous QHS   insulin aspart  0-9 Units Subcutaneous TID WC   ipratropium  2 puff Inhalation TID   levalbuterol  2 puff Inhalation TID   methylPREDNISolone (SOLU-MEDROL) injection  60 mg Intravenous Q12H   mometasone-formoterol  2 puff Inhalation BID   polyethylene glycol  17 g Oral BID   pravastatin  20 mg Oral q1800   senna-docusate  2 tablet Oral BID   sulfamethoxazole-trimethoprim  1 tablet Oral BID   tamsulosin  0.4 mg Oral BID   Continuous Infusions: PRN Meds:.alum & mag hydroxide-simeth, dextromethorphan-guaiFENesin, loperamide, morphine injection, oxyCODONE   PHYSICAL EXAM: Vital signs: Vitals:   04/27/18 1926 04/27/18 2023 04/28/18 0646 04/28/18 0817  BP: 113/79  130/80   Pulse: (!) 115  86   Resp: 18     Temp: 99.1 F (37.3 C)  99.1 F (37.3 C)   TempSrc: Oral  Oral   SpO2: 99% 99% 100% 98%  Weight:      Height:       Filed Weights   04/25/18 2011 04/26/18 0258 04/27/18 1335  Weight: 79 kg 75.8 kg 76.9 kg   Body mass index is 23.65 kg/m.   General appearance :Awake, alert, not in any distress. HEENT: Atraumatic and Normocephalic Neck: supple, no JVD.  Resp:Good air entry bilaterally, only a scattered rhonchi all over CVS: S1 S2 regular, no murmurs.  GI: Bowel sounds present, Non  tender and not distended with no gaurding, rigidity or rebound.No organomegaly Extremities: B/L Lower Ext shows no edema, both legs are warm to touch Neurology:  speech clear,Non focal, sensation is grossly intact. Musculoskeletal:No digital cyanosis Skin:No Rash, warm and dry Wounds:N/A  I have personally reviewed following labs and imaging studies  LABORATORY DATA: CBC: Recent Labs  Lab 04/23/18 1442  04/25/18 2022 04/26/18 0823 04/28/18 0243  WBC 5.1 5.7 6.1 6.2  NEUTROABS 4.6 4.4 4.9 5.8  HGB 11.6* 13.0 11.9* 10.7*  HCT 35.1* 40.1 35.9* 32.2*  MCV 96.4 94.6 94.0 92.0  PLT 205 254 249 301    Basic Metabolic Panel: Recent Labs  Lab 04/23/18 1442 04/25/18 2022 04/26/18 0823 04/27/18 0634  NA 132* 129* 132* 130*  K 4.7 4.7 4.7 3.9  CL 90* 84* 91* 86*  CO2 35* 33* 30 36*  GLUCOSE 125* 139* 110* 124*  BUN 14 17 18 14   CREATININE 0.65 0.70 0.75 0.65  CALCIUM 8.2* 8.7* 8.3* 7.9*    GFR: Estimated Creatinine Clearance: 100.7 mL/min (by C-G formula based on SCr of 0.65 mg/dL).  Liver Function Tests: Recent Labs  Lab 04/26/18 0212 04/26/18 0823 04/27/18 0634  AST 31 30 23   ALT 25 27 20   ALKPHOS 41 43 39  BILITOT 0.5 0.5 0.2*  PROT 6.1* 6.2* 4.8*  ALBUMIN 3.9 4.1 3.3*   No results for input(s): LIPASE, AMYLASE in the last 168 hours. No results for input(s): AMMONIA in the last 168 hours.  Coagulation Profile: No results for input(s): INR, PROTIME in the last 168 hours.  Cardiac Enzymes: Recent Labs  Lab 04/26/18 1152  TROPONINI <0.03    BNP (last 3 results) No results for input(s): PROBNP in the last 8760 hours.  HbA1C: No results for input(s): HGBA1C in the last 72 hours.  CBG: Recent Labs  Lab 04/27/18 0801 04/27/18 1132 04/27/18 1651 04/27/18 2110 04/28/18 0759  GLUCAP 123* 173* 190* 203* 127*    Lipid Profile: Recent Labs    04/26/18 1152  TRIG 82    Thyroid Function Tests: Recent Labs    04/26/18 0212 04/26/18 0823 04/26/18 0830  TSH 0.222*  --   --   T4TOTAL  --  5.7  --   T3FREE  --   --  1.8*    Anemia Panel: Recent Labs    04/26/18 1152  FERRITIN 90    Urine analysis:    Component Value Date/Time   COLORURINE YELLOW 04/25/2018 [place] 04/25/2018 2345   LABSPEC 1.014 04/25/2018 2345   PHURINE 6.0 04/25/2018 2345   GLUCOSEU NEGATIVE 04/25/2018 2345   HGBUR NEGATIVE 04/25/2018 2345    BILIRUBINUR NEGATIVE 04/25/2018 2345   KETONESUR 20 (A) 04/25/2018 2345   PROTEINUR 30 (A) 04/25/2018 2345   UROBILINOGEN 1.0 01/09/2013 1843   NITRITE NEGATIVE 04/25/2018 2345   LEUKOCYTESUR NEGATIVE 04/25/2018 2345    Sepsis Labs: Lactic Acid, Venous    Component Value Date/Time   LATICACIDVEN 1.32 11/28/2017 2031    MICROBIOLOGY: Recent Results (from the past 240 hour(s))  Urine culture     Status: Abnormal   Collection Time: 04/25/18 11:45 PM  Result Value Ref Range Status   Specimen Description URINE, CLEAN CATCH  Final   Special Requests NONE  Final   Culture (A)  Final    <10,000 COLONIES/mL INSIGNIFICANT GROWTH Performed at [facility], [place] [address] [place], [place] [address]    Report Status 04/27/2018 FINAL  Final  Respiratory  Panel by PCR     Status: None   Collection Time: 04/26/18 12:39 AM  Result Value Ref Range Status   Adenovirus NOT DETECTED NOT DETECTED Final   Coronavirus 229E NOT DETECTED NOT DETECTED Final    Comment: (NOTE) The Coronavirus on the Respiratory Panel, DOES NOT test for the novel  Coronavirus (2019 nCoV)    Coronavirus HKU1 NOT DETECTED NOT DETECTED Final   Coronavirus NL63 NOT DETECTED NOT DETECTED Final   Coronavirus OC43 NOT DETECTED NOT DETECTED Final   Metapneumovirus NOT DETECTED NOT DETECTED Final   Rhinovirus / Enterovirus NOT DETECTED NOT DETECTED Final   Influenza A NOT DETECTED NOT DETECTED Final   Influenza B NOT DETECTED NOT DETECTED Final   Parainfluenza Virus 1 NOT DETECTED NOT DETECTED Final   Parainfluenza Virus 2 NOT DETECTED NOT DETECTED Final   Parainfluenza Virus 3 NOT DETECTED NOT DETECTED Final   Parainfluenza Virus 4 NOT DETECTED NOT DETECTED Final   Respiratory Syncytial Virus NOT DETECTED NOT DETECTED Final   Bordetella pertussis NOT DETECTED NOT DETECTED Final   Chlamydophila pneumoniae NOT DETECTED NOT DETECTED Final   Mycoplasma pneumoniae NOT DETECTED NOT DETECTED Final    Comment: Performed  at [facility], [address] [place], [place] [address]  Novel Coronavirus, NAA (hospital order; send-out to ref lab)     Status: None   Collection Time: 04/26/18 12:39 AM  Result Value Ref Range Status   SARS-CoV-2, NAA NOT DETECTED NOT DETECTED Final    Comment: Performed at [facility]  Final    Comment: Performed at [facility], [place] [address], [place], [place] [address]    RADIOLOGY STUDIES/RESULTS: Ct Abdomen Pelvis Wo Contrast  Result Date: 04/26/2018 CLINICAL DATA:  63 year old male with worsening abdominal pain and history of prostate carcinoma EXAM: CT ABDOMEN AND PELVIS WITHOUT CONTRAST TECHNIQUE: Multidetector CT imaging of the abdomen and pelvis was performed following the standard protocol without IV contrast. COMPARISON:  01/17/2018 FINDINGS: Lower chest: Emphysema without acute finding of the lower chest. Hepatobiliary: Unremarkable appearance of the liver. Unremarkable gallbladder Pancreas: Unremarkable pancreas Spleen: Unremarkable spleen Adrenals/Urinary Tract: Unremarkable adrenal glands. Bilateral kidneys demonstrate no hydronephrosis. No nephrolithiasis. Unremarkable course the bilateral ureters. Cystic structure posterior cortex of the right kidney, unchanged. Other kidney cysts are not as well characterized on the current CT. Urinary bladder mostly decompressed. Balloon retention catheter in place. Gas at the anti dependent aspect of the urinary bladder. Stomach/Bowel: Hiatal hernia. Otherwise unremarkable stomach. Unremarkable small bowel with no abnormal distention. No transition point. Normal appendix. Mild stool burden. Colonic diverticular disease without associated inflammation. Vascular/Lymphatic: Calcifications of the aorta. Greatest diameter of the infrarenal abdominal aorta measures 2.4 cm. Calcifications bilateral iliac arteries. No adenopathy. Reproductive: Lentiform low-density fluid collection in the  rectovesical space. Fluid is inseparable from rectum and the posterior aspect of the prostate. Greatest diameter on the axial images measures 1.9 cm. Greatest diameter on the parasagittal images measures 4.6 cm. Other: Small foci of gas within the anterior abdominal wall with mild infiltration, likely injection granulomas. Musculoskeletal: No displaced fracture. Degenerative changes of the spine. The changes of the hips. IMPRESSION: There is a low-density lentiform fluid collection in the rectovesical space, inseparable from the rectum and prostate. 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 Gas within the urinary bladder is presumably related  to manipulation of the catheter in place. If there is concern for gas-forming organism, recommend correlation with urinalysis. Diverticular disease without evidence of acute diverticulitis. Additional ancillary findings as above. Electronically Signed   By: Corrie Mckusick D.O.   On: 04/26/2018 16:05   Dg Chest 2 View  Result Date: 04/03/2018 CLINICAL DATA:  63 year old male with increase shortness of breath. Chest pain. COPD on home oxygen. EXAM: CHEST - 2 VIEW COMPARISON:  03/26/2018 and earlier. FINDINGS: Semi upright AP and lateral views of the chest. Stable large lung volumes. Centrilobular emphysema demonstrated by CT last year. No pneumothorax, pulmonary edema, pleural effusion or confluent pulmonary opacity. Visualized tracheal air column is within normal limits. Normal cardiac size and mediastinal contours. No acute osseous abnormality identified. Negative visible bowel gas pattern. IMPRESSION: Emphysema (ICD10-J43.9). No acute cardiopulmonary abnormality. Electronically Signed   By: Genevie Ann M.D.   On: 04/03/2018 17:42   Dg Chest Port 1 View  Result Date: 04/25/2018 CLINICAL DATA:  Shortness of breath. EXAM:  PORTABLE CHEST 1 VIEW COMPARISON:  April 23, 2018 FINDINGS: Healed right rib fractures are noted. The heart, hila, and mediastinum are normal. Hyperinflation of the lungs is identified. No nodules, masses, or focal infiltrates. IMPRESSION: COPD.  No acute abnormalities. Electronically Signed   By: Dorise Bullion III M.D   On: 04/25/2018 21:11   Dg Chest Port 1 View  Result Date: 04/23/2018 CLINICAL DATA:  Cough, wheezing and shortness of breath. Recently discharged from the hospital for exacerbation of COPD. EXAM: PORTABLE CHEST 1 VIEW COMPARISON:  04/14/2018 FINDINGS: Cardiac silhouette is normal in size. No mediastinal or hilar masses. There is no evidence of adenopathy. Lungs are hyperexpanded, but clear. No pleural effusion or pneumothorax. There are old healed rib fractures on the right. No acute skeletal abnormality. IMPRESSION: 1. No acute cardiopulmonary disease. 2. COPD. Electronically Signed   By: Lajean Manes M.D.   On: 04/23/2018 14:27   Dg Chest Portable 1 View  Result Date: 04/14/2018 CLINICAL DATA:  Shortness of breath. EXAM: PORTABLE CHEST 1 VIEW COMPARISON:  Radiographs of April 03, 2018. FINDINGS: The heart size and mediastinal contours are within normal limits. Both lungs are clear. No pneumothorax or pleural effusion is noted. Old right rib fractures are noted. IMPRESSION: No acute cardiopulmonary abnormality seen. Emphysema (ICD10-J43.9). Electronically Signed   By: Marijo Conception, M.D.   On: 04/14/2018 17:30   Dg Abd Portable 2v  Result Date: 04/26/2018 CLINICAL DATA:  Abdominal pain for 2-3 days. Unable to urinate or height bowel movements. EXAM: PORTABLE ABDOMEN - 2 VIEW COMPARISON:  03/08/2018 FINDINGS: There is generalized increased bowel gas, but no bowel dilation to suggest obstruction and no air-fluid levels on the erect view. There is no free air. No significant increase in colonic stool. No evidence of renal or ureteral stones. Soft tissues are unremarkable. Clear lung  bases. IMPRESSION: 1. No acute findings.  No evidence of bowel obstruction or free air. 2. Generalized increased bowel gas, nonspecific. Electronically Signed   By: Lajean Manes M.D.   On: 04/26/2018 09:08     LOS: 2 days   Oren Binet, MD  [facility]  If 7PM-7AM, please contact night-coverage  Please page via [email or web address]  Go to [email or web address] and use [place]'s universal password to access. If you do not have the password, please contact the hospital operator.  Locate the [facility] provider you are looking for under [facility] and page to a number that you can be directly reached.  If you still have difficulty reaching the provider, please page the [facility] (Director on Call) for the Hospitalists listed on amion for assistance.  04/28/2018, 10:46 AM

## 2018-04-28 NOTE — Evaluation (Signed)
Physical Therapy Evaluation Patient Details Name: Taylor [name] MRN: [id number] DOB: 07-22-1955 Today's Date: 04/28/2018   History of Present Illness  Acute on chronic hypercarbic and hypoxic respiratory failure presumed secondary to COPD exacerbation, stable typically on 2 to 3 L, was able to wean down to 3 L this morning,  no longer having increased work of breathing or accessory muscle use that is improved with improvement in abdominal distention which was likely a result of his acute urinary retention.  The patient still requiring 4 L doubt COVID infection, PCR was negative additionally risk assessment labs are unremarkable (negative flu panel, negative RVP, unremarkable ferritin, LDH, CRP, d-dimer, troponin) will transfer patient off floor initiate NIPPV given hypercarbic on most recent ABG and symptoms most consistent with COPD flare.  Clinical Impression  Pt admitted with above diagnosis. Pt currently with functional limitations due to the deficits listed below (see PT Problem List). Pt was able to ambulate with min guard to min assist as he is unsteady and DOE 3/4 greatly limiting pt in increasing distance.  Pt stated he cannot go into hallway as he is too fatigued and breathless.  Pt struggling and asked for PT to turn O2 from 4L to 5L while he was in bathroom.  Pt sats 92-93%.  Pt pursed lip breathing but using accesory muscles considerably.  Feel that SNF would benefit pt but pt refused therefore [place] safety eval.  Also a rollator may help pt as well. Will folllow acutely.   Pt will benefit from skilled PT to increase their independence and safety with mobility to allow discharge to the venue listed below.      Follow Up Recommendations Home health PT;Supervision - Intermittent(safety eval)    Equipment Recommendations  Rollator    Recommendations for Other Services       Precautions / Restrictions Precautions Precautions: Fall Precaution Comments: 4L O2 continuous currently and  at baseline Restrictions Weight Bearing Restrictions: No      Mobility  Bed Mobility Overal bed mobility: Independent                Transfers Overall transfer level: Independent Equipment used: None                Ambulation/Gait Ambulation/Gait assistance: Supervision;Min guard Gait Distance (Feet): 20 Feet(10 feet x 2) Assistive device: None(grabbing at sink) Gait Pattern/deviations: Scissoring;Decreased stride length;Step-through pattern;Narrow base of support Gait velocity: decr  Gait velocity interpretation: <1.31 ft/sec, indicative of household ambulator General Gait Details: Pt states he needs to go to bathroom.  walked to bathroom and pt DOE 3/4 by the time he sat on toilet.  Pt asked to be turned from 4L to 5L.  O2 was 92-93% therefore turned up so he could get his breath.  Once he came out of bathroom, he walked to recliner and was again DOE 3/4 and stated he could not walk in hallway.  Turned O2 back to 4L prior to leaving pt and O2 sat was 93%.    Stairs            Wheelchair Mobility    Modified Rankin (Stroke Patients Only)       Balance Overall balance assessment: Needs assistance         Standing balance support: No upper extremity supported;During functional activity;Single extremity supported Standing balance-Leahy Scale: Fair Standing balance comment: can stand statically wihtout UE support but dynamically needs UE support  Pertinent Vitals/Pain Pain Assessment: No/denies pain    Home Living Family/patient expects to be discharged to:: Private residence Living Arrangements: Non-relatives/Friends Available Help at Discharge: Friend(s);Available PRN/intermittently Type of Home: House Home Access: Stairs to enter Entrance Stairs-Rails: Right Entrance Stairs-Number of Steps: 3 Home Layout: One level Home Equipment: Shower seat - built in Additional Comments:  only Dealer, pt has  been on disabillty    Prior Function Level of Independence: Independent         Comments: limited in ambulation and ADLs due to dyspnea on exertion. Pt reports friend helps him get oxygen together, per previous note friend that lives with him also does grocery shopping.     Hand Dominance   Dominant Hand: Right    Extremity/Trunk Assessment   Upper Extremity Assessment Upper Extremity Assessment: Defer to OT evaluation    Lower Extremity Assessment Lower Extremity Assessment: Overall WFL for tasks assessed    Cervical / Trunk Assessment Cervical / Trunk Assessment: Normal  Communication   Communication: No difficulties  Cognition Arousal/Alertness: Awake/alert Behavior During Therapy: WFL for tasks assessed/performed Overall Cognitive Status: Within Functional Limits for tasks assessed                                        General Comments      Exercises     Assessment/Plan    PT Assessment Patient needs continued PT services  PT Problem List Decreased activity tolerance;Decreased strength;Decreased mobility       PT Treatment Interventions Functional mobility training;Balance training;Patient/family education;Gait training;Therapeutic activities;Therapeutic exercise;Stair training    PT Goals (Current goals can be found in the Care Plan section)  Acute Rehab PT Goals Patient Stated Goal: breathe better PT Goal Formulation: With patient Time For Goal Achievement: 05/12/18 Potential to Achieve Goals: Good    Frequency Min 3X/week   Barriers to discharge Decreased caregiver support      Co-evaluation               AM-PAC PT "6 Clicks" Mobility  Outcome Measure Help needed turning from your back to your side while in a flat bed without using bedrails?: None Help needed moving from lying on your back to sitting on the side of a flat bed without using bedrails?: None Help needed moving to and from a bed to a chair (including a  wheelchair)?: A Little Help needed standing up from a chair using your arms (e.g., wheelchair or bedside chair)?: A Little Help needed to walk in hospital room?: A Little Help needed climbing 3-5 steps with a railing? : A Little 6 Click Score: 20    End of Session Equipment Utilized During Treatment: Gait belt;Oxygen Activity Tolerance: Patient limited by fatigue Patient left: in chair;with call bell/phone within reach Nurse Communication: Mobility status PT Visit Diagnosis: Other abnormalities of gait and mobility (R26.89)    Time: 6010-9323 PT Time Calculation (min) (ACUTE ONLY): 20 min   Charges:   PT Evaluation $PT Eval Moderate Complexity: [place] Pager:  [phone]  Office:  [place] 04/28/2018, 10:57 AM

## 2018-04-29 ENCOUNTER — Ambulatory Visit: Payer: Medicaid Other

## 2018-04-29 LAB — CBC WITH DIFFERENTIAL/PLATELET
Abs Immature Granulocytes: 0.05 10*3/uL (ref 0.00–0.07)
Basophils Absolute: 0 10*3/uL (ref 0.0–0.1)
Basophils Relative: 0 %
Eosinophils Absolute: 0 10*3/uL (ref 0.0–0.5)
Eosinophils Relative: 0 %
HCT: 34.1 % — ABNORMAL LOW (ref 39.0–52.0)
Hemoglobin: 11.3 g/dL — ABNORMAL LOW (ref 13.0–17.0)
Immature Granulocytes: 1 %
Lymphocytes Relative: 4 %
Lymphs Abs: 0.2 10*3/uL — ABNORMAL LOW (ref 0.7–4.0)
MCH: 31.1 pg (ref 26.0–34.0)
MCHC: 33.1 g/dL (ref 30.0–36.0)
MCV: 93.9 fL (ref 80.0–100.0)
Monocytes Absolute: 0.2 10*3/uL (ref 0.1–1.0)
Monocytes Relative: 3 %
Neutro Abs: 6 10*3/uL (ref 1.7–7.7)
Neutrophils Relative %: 92 %
Platelets: 228 10*3/uL (ref 150–400)
RBC: 3.63 MIL/uL — ABNORMAL LOW (ref 4.22–5.81)
RDW: 13.6 % (ref 11.5–15.5)
WBC: 6.5 10*3/uL (ref 4.0–10.5)
nRBC: 0 % (ref 0.0–0.2)

## 2018-04-29 LAB — GLUCOSE, CAPILLARY
Glucose-Capillary: 173 mg/dL — ABNORMAL HIGH (ref 70–99)
Glucose-Capillary: 167 mg/dL — ABNORMAL HIGH (ref 70–99)
Glucose-Capillary: 89 mg/dL (ref 70–99)

## 2018-04-29 MED ORDER — PREDNISONE 10 MG PO TABS: ORAL_TABLET | ORAL | 0 refills | Status: DC

## 2018-04-29 MED ORDER — METOPROLOL TARTRATE 25 MG PO TABS: 12.5000 mg | ORAL_TABLET | Freq: Two times a day (BID) | ORAL | 0 refills | Status: DC

## 2018-04-29 MED ORDER — METOPROLOL TARTRATE 12.5 MG HALF TABLET
12.5000 mg | ORAL_TABLET | Freq: Two times a day (BID) | ORAL | Status: DC
  Administered 2018-04-29: 12.5 mg via ORAL
  Filled 2018-04-29: qty 1

## 2018-04-29 MED ORDER — ARIPIPRAZOLE 5 MG PO TABS: 5.0000 mg | ORAL_TABLET | Freq: Every day | ORAL | Status: DC

## 2018-04-29 MED ORDER — ESCITALOPRAM OXALATE 20 MG PO TABS: 20.0000 mg | ORAL_TABLET | Freq: Every day | ORAL | Status: DC

## 2018-04-29 NOTE — Discharge Summary (Signed)
PATIENT DETAILS Name: Taylor [name] Age: 63 y.o. Sex: male Date of Birth: 11-30-1955 MRN: [id number]. Admitting Physician: [name], MD [id number], Lesli Albee, PA-C  Admit Date: 04/25/2018 Discharge date: 04/29/2018  Recommendations for Outpatient Follow-up:  1. Follow up with PCP in 1-2 weeks 2. Please obtain BMP/CBC in one week 3. Likely has sick euthyroid syndrome-repeat TSH and T3 in the next few weeks   Admitted From:  Home  Disposition: Home with home health [place]: Yes  Equipment/Devices: Resume home O2- previous regimen  Discharge Condition: Stable  CODE STATUS: FULL CODE  Diet recommendation:  Heart Healthy / Carb Modified   Brief Summary: See H&P, Labs, Consult and Test reports for all details in brief,Patient is a 63 y.o. male history of COPD on chronic hypoxic respiratory on 3 L of oxygen at home-presented with shortness of breath-thought to have COPD exacerbation.  See below for further details.  Brief Hospital Course: Acute on chronic hypercarbic/hypoxic respiratory failure secondary to COPD exacerbation:  Much improved after treatment with IV steroids, bronchodilators and other supportive care.  Back on his usual regimen of FiO2.  This morning he feels better-ambulating in the hallway without any issues-he is now back to his usual baseline.  Will transition to tapering prednisone-continue his usual inhaler regimen on discharge.  Have instructed patient to follow-up with his PCP for further optimization of his inhaler regimen.   Respiratory virus panel and COVID-19 negative.  Acute urinary retention: Resolved-Foley catheter removed on 4/8-voiding trial successful-patient voiding on his own.  Continue Flomax on discharge.    Constipation: Resolved  Hyponatremia:  Is very mild-stable for further monitoring in the outpatient setting.  DM-2: CBG stable with SSI-resume metformin on discharge  Dyslipidemia: Continue  statin  Depression: Continue Abilify and Lexapro (patient claims he has a supply at home)  Mildly suppressed TSH/free T3: Likely sick euthyroid syndrome stable for outpatient follow-up.  History of prostate cancer: Stable for outpatient follow-up with urology/oncology  Deconditioning/debility: Likely secondary to acute illness-PT eval completed-home health services recommended on discharge  Procedures/Studies: None  Discharge Diagnoses:  Principal Problem:   Acute on chronic respiratory failure with hypoxia (HCC) Active Problems:   Depression   Alcohol abuse   COPD exacerbation (HCC)   Acute on chronic respiratory failure with hypoxia and hypercapnia (HCC)   HLD (hyperlipidemia)   Prostate cancer (HCC)   Anxiety and depression   Diabetes mellitus without complication (HCC)   Abdominal distension   Respiratory distress   Hyponatremia   Suspected Covid-19 Virus Infection   Respiratory failure with hypoxia and hypercapnia ([place])   Acute urinary retention   Discharge Instructions:  Activity:  As tolerated with Full fall precautions use walker/cane & assistance as needed  Discharge Instructions    Diet - low sodium heart healthy   Complete by:  As directed    Discharge instructions   Complete by:  As directed    Follow with Primary MD  Clent Demark, PA-C in 1 week  Please get a complete blood count and chemistry panel checked by your Primary MD at your next visit, and again as instructed by your Primary MD.  Get Medicines reviewed and adjusted: Please take all your medications with you for your next visit with your Primary MD  Laboratory/radiological data: Please request your Primary MD to go over all hospital tests and procedure/radiological results at the follow up, please ask your Primary MD to get all Hospital records sent to his/her office.  In some cases, they will be blood work, cultures and biopsy results pending at the time of your discharge. Please  request that your primary care M.D. follows up on these results.  Also Note the following: If you experience worsening of your admission symptoms, develop shortness of breath, life threatening emergency, suicidal or homicidal thoughts you must seek medical attention immediately by calling 911 or calling your MD immediately  if symptoms less severe.  You must read complete instructions/literature along with all the possible adverse reactions/side effects for all the Medicines you take and that have been prescribed to you. Take any new Medicines after you have completely understood and accpet all the possible adverse reactions/side effects.   Do not drive when taking Pain medications or sleeping medications (Benzodaizepines)  Do not take more than prescribed Pain, Sleep and Anxiety Medications. It is not advisable to combine anxiety,sleep and pain medications without talking with your primary care practitioner  Special Instructions: If you have smoked or chewed Tobacco  in the last 2 yrs please stop smoking, stop any regular Alcohol  and or any Recreational drug use.  Wear Seat belts while driving.  Please note: You were cared for by a hospitalist during your hospital stay. Once you are discharged, your primary care physician will handle any further medical issues. Please note that NO REFILLS for any discharge medications will be authorized once you are discharged, as it is imperative that you return to your primary care physician (or establish a relationship with a primary care physician if you do not have one) for your post hospital discharge needs so that they can reassess your need for medications and monitor your lab values.   Increase activity slowly   Complete by:  As directed      Allergies as of 04/29/2018      Reactions   Codeine Nausea And Vomiting      Medication List    STOP taking these medications   sulfamethoxazole-trimethoprim 800-160 MG tablet Commonly known as:  BACTRIM  DS,SEPTRA DS     TAKE these medications   Accu-Chek Aviva device Use as instructed   accu-chek soft touch lancets 1 each by Other route 2 (two) times daily. Use as instructed   albuterol (2.5 MG/3ML) 0.083% nebulizer solution Commonly known as:  PROVENTIL Take 3 mLs (2.5 mg total) by nebulization every 4 (four) hours as needed for wheezing or shortness of breath (if you can't catch your breath).   ARIPiprazole 5 MG tablet Commonly known as:  ABILIFY Take 1 tablet (5 mg total) by mouth daily.   escitalopram 20 MG tablet Commonly known as:  LEXAPRO Take 1 tablet (20 mg total) by mouth daily.   glucose blood test strip Commonly known as:  Accu-Chek Aviva 1 each by Other route as needed for other. Use as instructed   loperamide 2 MG capsule Commonly known as:  IMODIUM Take 2 every 6 hours if continued diarrhea What changed:    how much to take  how to take this  when to take this  reasons to take this   lovastatin 20 MG tablet Commonly known as:  MEVACOR Take 1 tablet (20 mg total) by mouth at bedtime.   metFORMIN 1000 MG tablet Commonly known as:  GLUCOPHAGE Take 1 tablet (1,000 mg total) by mouth 2 (two) times daily with a meal.   metoprolol tartrate 25 MG tablet Commonly known as:  LOPRESSOR Take 0.5 tablets (12.5 mg total) by mouth 2 (two) times daily for  60 doses.   mometasone-formoterol 100-5 MCG/ACT Aero Commonly known as:  DULERA Inhale 2 puffs into the lungs 2 (two) times daily.   OXYGEN Inhale 3 L into the lungs continuous.   predniSONE 10 MG tablet Commonly known as:  DELTASONE Take 40 mg daily for 2 days, 30 mg daily for 2 days, 20 mg daily for 2 days,10 mg daily for 1 days, then stop What changed:    medication strength  additional instructions   tamsulosin 0.4 MG Caps capsule Commonly known as:  FLOMAX Take 1 capsule (0.4 mg total) by mouth daily.   tiotropium 18 MCG inhalation capsule Commonly known as:  SPIRIVA Place 1 capsule (18  mcg total) into inhaler and inhale daily.      Follow-up Information    [facility] Follow up on 05/05/2018.   Specialty:  Family Medicine Why:  Telehealth appointment scheduled for 05/05/2018 at 8:30 am Contact information: [place] [id number] [phone]       Clent Demark, PA-C. Schedule an appointment as soon as possible for a visit in 1 week(s).   Specialty:  Physician Assistant Contact information: [place] [address] [phone]          Allergies  Allergen Reactions   Codeine Nausea And Vomiting    Consultations:   None   Other Procedures/Studies: Ct Abdomen Pelvis Wo Contrast  Result Date: 04/26/2018 CLINICAL DATA:  63 year old male with worsening abdominal pain and history of prostate carcinoma EXAM: CT ABDOMEN AND PELVIS WITHOUT CONTRAST TECHNIQUE: Multidetector CT imaging of the abdomen and pelvis was performed following the standard protocol without IV contrast. COMPARISON:  01/17/2018 FINDINGS: Lower chest: Emphysema without acute finding of the lower chest. Hepatobiliary: Unremarkable appearance of the liver. Unremarkable gallbladder Pancreas: Unremarkable pancreas Spleen: Unremarkable spleen Adrenals/Urinary Tract: Unremarkable adrenal glands. Bilateral kidneys demonstrate no hydronephrosis. No nephrolithiasis. Unremarkable course the bilateral ureters. Cystic structure posterior cortex of the right kidney, unchanged. Other kidney cysts are not as well characterized on the current CT. Urinary bladder mostly decompressed. Balloon retention catheter in place. Gas at the anti dependent aspect of the urinary bladder. Stomach/Bowel: Hiatal hernia. Otherwise unremarkable stomach. Unremarkable small bowel with no abnormal distention. No transition point. Normal appendix. Mild stool burden. Colonic diverticular disease without associated inflammation. Vascular/Lymphatic:  Calcifications of the aorta. Greatest diameter of the infrarenal abdominal aorta measures 2.4 cm. Calcifications bilateral iliac arteries. No adenopathy. Reproductive: Lentiform low-density fluid collection in the rectovesical space. Fluid is inseparable from rectum and the posterior aspect of the prostate. Greatest diameter on the axial images measures 1.9 cm. Greatest diameter on the parasagittal images measures 4.6 cm. Other: Small foci of gas within the anterior abdominal wall with mild infiltration, likely injection granulomas. Musculoskeletal: No displaced fracture. Degenerative changes of the spine. The changes of the hips. IMPRESSION: There is a low-density lentiform fluid collection in the rectovesical space, inseparable from the rectum and prostate. 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 Gas within the urinary bladder is presumably related to manipulation of the catheter in place. If there is concern for gas-forming organism, recommend correlation with urinalysis. Diverticular disease without evidence of acute diverticulitis. Additional ancillary findings as above. Electronically Signed   By: Corrie Mckusick D.O.   On: 04/26/2018 16:05   Dg Chest 2 View  Result Date: 04/03/2018 CLINICAL  DATA:  62 year old male with increase shortness of breath. Chest pain. COPD on home oxygen. EXAM: CHEST - 2 VIEW COMPARISON:  03/26/2018 and earlier. FINDINGS: Semi upright AP and lateral views of the chest. Stable large lung volumes. Centrilobular emphysema demonstrated by CT last year. No pneumothorax, pulmonary edema, pleural effusion or confluent pulmonary opacity. Visualized tracheal air column is within normal limits. Normal cardiac size and mediastinal contours. No acute osseous abnormality identified. Negative visible bowel gas pattern. IMPRESSION: Emphysema  (ICD10-J43.9). No acute cardiopulmonary abnormality. Electronically Signed   By: Genevie Ann M.D.   On: 04/03/2018 17:42   Dg Chest Port 1 View  Result Date: 04/25/2018 CLINICAL DATA:  Shortness of breath. EXAM: PORTABLE CHEST 1 VIEW COMPARISON:  April 23, 2018 FINDINGS: Healed right rib fractures are noted. The heart, hila, and mediastinum are normal. Hyperinflation of the lungs is identified. No nodules, masses, or focal infiltrates. IMPRESSION: COPD.  No acute abnormalities. Electronically Signed   By: Dorise Bullion III M.D   On: 04/25/2018 21:11   Dg Chest Port 1 View  Result Date: 04/23/2018 CLINICAL DATA:  Cough, wheezing and shortness of breath. Recently discharged from the hospital for exacerbation of COPD. EXAM: PORTABLE CHEST 1 VIEW COMPARISON:  04/14/2018 FINDINGS: Cardiac silhouette is normal in size. No mediastinal or hilar masses. There is no evidence of adenopathy. Lungs are hyperexpanded, but clear. No pleural effusion or pneumothorax. There are old healed rib fractures on the right. No acute skeletal abnormality. IMPRESSION: 1. No acute cardiopulmonary disease. 2. COPD. Electronically Signed   By: Lajean Manes M.D.   On: 04/23/2018 14:27   Dg Chest Portable 1 View  Result Date: 04/14/2018 CLINICAL DATA:  Shortness of breath. EXAM: PORTABLE CHEST 1 VIEW COMPARISON:  Radiographs of April 03, 2018. FINDINGS: The heart size and mediastinal contours are within normal limits. Both lungs are clear. No pneumothorax or pleural effusion is noted. Old right rib fractures are noted. IMPRESSION: No acute cardiopulmonary abnormality seen. Emphysema (ICD10-J43.9). Electronically Signed   By: Marijo Conception, M.D.   On: 04/14/2018 17:30   Dg Abd Portable 2v  Result Date: 04/26/2018 CLINICAL DATA:  Abdominal pain for 2-3 days. Unable to urinate or height bowel movements. EXAM: PORTABLE ABDOMEN - 2 VIEW COMPARISON:  03/08/2018 FINDINGS: There is generalized increased bowel gas, but no bowel dilation to  suggest obstruction and no air-fluid levels on the erect view. There is no free air. No significant increase in colonic stool. No evidence of renal or ureteral stones. Soft tissues are unremarkable. Clear lung bases. IMPRESSION: 1. No acute findings.  No evidence of bowel obstruction or free air. 2. Generalized increased bowel gas, nonspecific. Electronically Signed   By: Lajean Manes M.D.   On: 04/26/2018 09:08      TODAY-DAY OF DISCHARGE:  Subjective:   Taylor [name] today has no headache,no chest abdominal pain,no new weakness tingling or numbness, feels much better wants to go home today.   Objective:   Blood pressure (!) 146/87, pulse (!) 120, temperature 98.9 F (37.2 C), temperature source Oral, resp. rate (!) 26, height 5\' 11"  (1.803 m), weight 76.9 kg, SpO2 95 %.  Intake/Output Summary (Last 24 hours) at 04/29/2018 1103 Last data filed at 04/29/2018 0835 Gross per 24 hour  Intake --  Output 3800 ml  Net -3800 ml   Filed Weights   04/25/18 2011 04/26/18 0258 04/27/18 1335  Weight: 79 kg 75.8 kg 76.9 kg    Exam: Awake Alert, Oriented *3, No  new F.N deficits, Normal affect [place].AT,PERRAL Supple Neck,No JVD, No cervical lymphadenopathy appriciated.  Symmetrical Chest wall movement, Good air movement bilaterally, CTAB RRR,No Gallops,Rubs or new Murmurs, No Parasternal Heave +ve B.Sounds, Abd Soft, Non tender, No organomegaly appriciated, No rebound -guarding or rigidity. No Cyanosis, Clubbing or edema, No new Rash or bruise   PERTINENT RADIOLOGIC STUDIES: Ct Abdomen Pelvis Wo Contrast  Result Date: 04/26/2018 CLINICAL DATA:  63 year old male with worsening abdominal pain and history of prostate carcinoma EXAM: CT ABDOMEN AND PELVIS WITHOUT CONTRAST TECHNIQUE: Multidetector CT imaging of the abdomen and pelvis was performed following the standard protocol without IV contrast. COMPARISON:  01/17/2018 FINDINGS: Lower chest: Emphysema without acute finding of the lower chest.  Hepatobiliary: Unremarkable appearance of the liver. Unremarkable gallbladder Pancreas: Unremarkable pancreas Spleen: Unremarkable spleen Adrenals/Urinary Tract: Unremarkable adrenal glands. Bilateral kidneys demonstrate no hydronephrosis. No nephrolithiasis. Unremarkable course the bilateral ureters. Cystic structure posterior cortex of the right kidney, unchanged. Other kidney cysts are not as well characterized on the current CT. Urinary bladder mostly decompressed. Balloon retention catheter in place. Gas at the anti dependent aspect of the urinary bladder. Stomach/Bowel: Hiatal hernia. Otherwise unremarkable stomach. Unremarkable small bowel with no abnormal distention. No transition point. Normal appendix. Mild stool burden. Colonic diverticular disease without associated inflammation. Vascular/Lymphatic: Calcifications of the aorta. Greatest diameter of the infrarenal abdominal aorta measures 2.4 cm. Calcifications bilateral iliac arteries. No adenopathy. Reproductive: Lentiform low-density fluid collection in the rectovesical space. Fluid is inseparable from rectum and the posterior aspect of the prostate. Greatest diameter on the axial images measures 1.9 cm. Greatest diameter on the parasagittal images measures 4.6 cm. Other: Small foci of gas within the anterior abdominal wall with mild infiltration, likely injection granulomas. Musculoskeletal: No displaced fracture. Degenerative changes of the spine. The changes of the hips. IMPRESSION: There is a low-density lentiform fluid collection in the rectovesical space, inseparable from the rectum and prostate. 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 Gas within the urinary bladder is presumably related to manipulation of the catheter in place. If there is concern for gas-forming organism, recommend  correlation with urinalysis. Diverticular disease without evidence of acute diverticulitis. Additional ancillary findings as above. Electronically Signed   By: Corrie Mckusick D.O.   On: 04/26/2018 16:05   Dg Chest 2 View  Result Date: 04/03/2018 CLINICAL DATA:  63 year old male with increase shortness of breath. Chest pain. COPD on home oxygen. EXAM: CHEST - 2 VIEW COMPARISON:  03/26/2018 and earlier. FINDINGS: Semi upright AP and lateral views of the chest. Stable large lung volumes. Centrilobular emphysema demonstrated by CT last year. No pneumothorax, pulmonary edema, pleural effusion or confluent pulmonary opacity. Visualized tracheal air column is within normal limits. Normal cardiac size and mediastinal contours. No acute osseous abnormality identified. Negative visible bowel gas pattern. IMPRESSION: Emphysema (ICD10-J43.9). No acute cardiopulmonary abnormality. Electronically Signed   By: Genevie Ann M.D.   On: 04/03/2018 17:42   Dg Chest Port 1 View  Result Date: 04/25/2018 CLINICAL DATA:  Shortness of breath. EXAM: PORTABLE CHEST 1 VIEW COMPARISON:  April 23, 2018 FINDINGS: Healed right rib fractures are noted. The heart, hila, and mediastinum are normal. Hyperinflation of the lungs is identified. No nodules, masses, or focal infiltrates. IMPRESSION: COPD.  No acute abnormalities. Electronically Signed   By: Dorise Bullion III M.D   On: 04/25/2018 21:11   Dg Chest  Port 1 View  Result Date: 04/23/2018 CLINICAL DATA:  Cough, wheezing and shortness of breath. Recently discharged from the hospital for exacerbation of COPD. EXAM: PORTABLE CHEST 1 VIEW COMPARISON:  04/14/2018 FINDINGS: Cardiac silhouette is normal in size. No mediastinal or hilar masses. There is no evidence of adenopathy. Lungs are hyperexpanded, but clear. No pleural effusion or pneumothorax. There are old healed rib fractures on the right. No acute skeletal abnormality. IMPRESSION: 1. No acute cardiopulmonary disease. 2. COPD.  Electronically Signed   By: Lajean Manes M.D.   On: 04/23/2018 14:27   Dg Chest Portable 1 View  Result Date: 04/14/2018 CLINICAL DATA:  Shortness of breath. EXAM: PORTABLE CHEST 1 VIEW COMPARISON:  Radiographs of April 03, 2018. FINDINGS: The heart size and mediastinal contours are within normal limits. Both lungs are clear. No pneumothorax or pleural effusion is noted. Old right rib fractures are noted. IMPRESSION: No acute cardiopulmonary abnormality seen. Emphysema (ICD10-J43.9). Electronically Signed   By: Marijo Conception, M.D.   On: 04/14/2018 17:30   Dg Abd Portable 2v  Result Date: 04/26/2018 CLINICAL DATA:  Abdominal pain for 2-3 days. Unable to urinate or height bowel movements. EXAM: PORTABLE ABDOMEN - 2 VIEW COMPARISON:  03/08/2018 FINDINGS: There is generalized increased bowel gas, but no bowel dilation to suggest obstruction and no air-fluid levels on the erect view. There is no free air. No significant increase in colonic stool. No evidence of renal or ureteral stones. Soft tissues are unremarkable. Clear lung bases. IMPRESSION: 1. No acute findings.  No evidence of bowel obstruction or free air. 2. Generalized increased bowel gas, nonspecific. Electronically Signed   By: Lajean Manes M.D.   On: 04/26/2018 09:08     PERTINENT LAB RESULTS: CBC: Recent Labs    04/28/18 0243 04/29/18 0259  WBC 6.2 6.5  HGB 10.7* 11.3*  HCT 32.2* 34.1*  PLT 226 228   CMET CMP     Component Value Date/Time   NA 130 (L) 04/27/2018 0634   NA 136 05/12/2012 1018   K 3.9 04/27/2018 0634   K 4.6 05/12/2012 1018   CL 86 (L) 04/27/2018 0634   CL 100 05/12/2012 1018   CO2 36 (H) 04/27/2018 0634   CO2 27 05/12/2012 1018   GLUCOSE 124 (H) 04/27/2018 0634   GLUCOSE 107 (H) 05/12/2012 1018   BUN 14 04/27/2018 0634   BUN 11.1 05/12/2012 1018   CREATININE 0.65 04/27/2018 0634   CREATININE 1.0 05/12/2012 1018   CALCIUM 7.9 (L) 04/27/2018 0634   CALCIUM 9.0 05/12/2012 1018   PROT 4.8 (L)  04/27/2018 0634   PROT 6.9 10/22/2011 0946   ALBUMIN 3.3 (L) 04/27/2018 0634   ALBUMIN 3.8 10/22/2011 0946   AST 23 04/27/2018 0634   AST 38 (H) 10/22/2011 0946   ALT 20 04/27/2018 0634   ALT 29 10/22/2011 0946   ALKPHOS 39 04/27/2018 0634   ALKPHOS 100 10/22/2011 0946   BILITOT 0.2 (L) 04/27/2018 0634   BILITOT 0.80 10/22/2011 0946   GFRNONAA >60 04/27/2018 0634   GFRAA >60 04/27/2018 0634    GFR Estimated Creatinine Clearance: 100.7 mL/min (by C-G formula based on SCr of 0.65 mg/dL). No results for input(s): LIPASE, AMYLASE in the last 72 hours. Recent Labs    04/26/18 1152  TROPONINI <0.03   Invalid input(s): POCBNP Recent Labs    04/26/18 1152  DDIMER <0.27   No results for input(s): HGBA1C in the last 72 hours. Recent Labs    04/26/18 1152  TRIG 82   No results for input(s): TSH, T4TOTAL, T3FREE, THYROIDAB in the last 72 hours.  Invalid input(s): FREET3 Recent Labs    04/26/18 1152  FERRITIN 90   Coags: No results for input(s): INR in the last 72 hours.  Invalid input(s): PT Microbiology: Recent Results (from the past 240 hour(s))  Urine culture     Status: Abnormal   Collection Time: 04/25/18 11:45 PM  Result Value Ref Range Status   Specimen Description URINE, CLEAN CATCH  Final   Special Requests NONE  Final   Culture (A)  Final    <10,000 COLONIES/mL INSIGNIFICANT GROWTH Performed at [facility], [place] [address], [place], [place] [address]    Report Status 04/27/2018 FINAL  Final  Respiratory Panel by PCR     Status: None   Collection Time: 04/26/18 12:39 AM  Result Value Ref Range Status   Adenovirus NOT DETECTED NOT DETECTED Final   Coronavirus 229E NOT DETECTED NOT DETECTED Final    Comment: (NOTE) The Coronavirus on the Respiratory Panel, DOES NOT test for the novel  Coronavirus (2019 nCoV)    Coronavirus HKU1 NOT DETECTED NOT DETECTED Final   Coronavirus NL63 NOT DETECTED NOT DETECTED Final   Coronavirus OC43 NOT DETECTED NOT  DETECTED Final   Metapneumovirus NOT DETECTED NOT DETECTED Final   Rhinovirus / Enterovirus NOT DETECTED NOT DETECTED Final   Influenza A NOT DETECTED NOT DETECTED Final   Influenza B NOT DETECTED NOT DETECTED Final   Parainfluenza Virus 1 NOT DETECTED NOT DETECTED Final   Parainfluenza Virus 2 NOT DETECTED NOT DETECTED Final   Parainfluenza Virus 3 NOT DETECTED NOT DETECTED Final   Parainfluenza Virus 4 NOT DETECTED NOT DETECTED Final   Respiratory Syncytial Virus NOT DETECTED NOT DETECTED Final   Bordetella pertussis NOT DETECTED NOT DETECTED Final   Chlamydophila pneumoniae NOT DETECTED NOT DETECTED Final   Mycoplasma pneumoniae NOT DETECTED NOT DETECTED Final    Comment: Performed at [facility], [place] [address] [place], [place] [address]  Novel Coronavirus, NAA (hospital order; send-out to ref lab)     Status: None   Collection Time: 04/26/18 12:39 AM  Result Value Ref Range Status   SARS-CoV-2, NAA NOT DETECTED NOT DETECTED Final    Comment: Performed at [facility]  Final    Comment: Performed at [facility], [place] [address], [place], [place] [address]    FURTHER DISCHARGE INSTRUCTIONS:  Get Medicines reviewed and adjusted: Please take all your medications with you for your next visit with your Primary MD  Laboratory/radiological data: Please request your Primary MD to go over all hospital tests and procedure/radiological results at the follow up, please ask your Primary MD to get all Hospital records sent to his/her office.  In some cases, they will be blood work, cultures and biopsy results pending at the time of your discharge. Please request that your primary care M.D. goes through all the records of your hospital data and follows up on these results.  Also Note the following: If you experience worsening of your admission symptoms, develop shortness of breath, life threatening emergency, suicidal or homicidal thoughts  you must seek medical attention immediately by calling 911 or calling your MD immediately  if symptoms less severe.  You must read complete instructions/literature along with all the possible adverse reactions/side effects for all the Medicines you take and that have been prescribed to you. Take any new Medicines  after you have completely understood and accpet all the possible adverse reactions/side effects.   Do not drive when taking Pain medications or sleeping medications (Benzodaizepines)  Do not take more than prescribed Pain, Sleep and Anxiety Medications. It is not advisable to combine anxiety,sleep and pain medications without talking with your primary care practitioner  Special Instructions: If you have smoked or chewed Tobacco  in the last 2 yrs please stop smoking, stop any regular Alcohol  and or any Recreational drug use.  Wear Seat belts while driving.  Please note: You were cared for by a hospitalist during your hospital stay. Once you are discharged, your primary care physician will handle any further medical issues. Please note that NO REFILLS for any discharge medications will be authorized once you are discharged, as it is imperative that you return to your primary care physician (or establish a relationship with a primary care physician if you do not have one) for your post hospital discharge needs so that they can reassess your need for medications and monitor your lab values.  Total Time spent coordinating discharge including counseling, education and face to face time equals 35 minutes.  SignedOren Binet 04/29/2018 11:03 AM

## 2018-04-29 NOTE — Progress Notes (Signed)
Pt given discharge instructions, prescriptions, and care notes. Pt verbalized understanding AEB no further questions or concerns at this time. IV was discontinued, no redness, pain, or swelling noted at this time. Telemetry discontinued and Centralized Telemetry was notified. Pt left the floor via PTAR in stable condition.  °

## 2018-04-29 NOTE — Progress Notes (Signed)
Trial successful-discharge home with home health services today.  See discharge summary for details.

## 2018-04-29 NOTE — TOC Transition Note (Addendum)
Transition of Care [facility]) - CM/SW Discharge Note   Patient Details  Name: Taylor [name] MRN: [id number] Date of Birth: Apr 14, 1955  [facility] CM/SW Contact:  Sharin Mons, RN Phone Number: 04/29/2018, 10:41 AM   Clinical Narrative:    Pt transitioning to home today with self care. Refused home health services( PT) and rolator. PTAR services for transportation to home will need to be arranged once pt ready, bedside nurse aware.    04/29/2018 Pt transportation to home scheduled with [facility] for 12 noon, nurse and pt made aware.  Barriers to Discharge: No Barriers Identified   Patient Goals and CMS Choice Patient states their goals for this hospitalization and ongoing recovery are:: go home   Choice offered to / list presented to : NA  Discharge Placement                       Discharge Plan and Services   Discharge Planning Services: CM Consult              DME Agency: NA HH Arranged: NA HH Agency: NA   Social Determinants of Health (SDOH) Interventions     Readmission Risk Interventions Readmission Risk Prevention Plan 04/27/2018 09/19/2017  Transportation Screening Complete Complete  Medication Review [employer]) Complete -  PCP or Specialist appointment within 3-5 days of discharge Complete -  [place] or [place] Complete Patient refused  SW Recovery Care/Counseling Consult Not Complete -  SW Consult Not Complete Comments NA -  Palliative Care Screening Not Applicable -  [place] Not Applicable -  Some recent data might be hidden

## 2018-04-30 ENCOUNTER — Ambulatory Visit: Payer: Medicaid Other

## 2018-05-01 ENCOUNTER — Encounter (HOSPITAL_COMMUNITY): Payer: Self-pay | Admitting: Emergency Medicine

## 2018-05-01 ENCOUNTER — Ambulatory Visit: Payer: Medicaid Other

## 2018-05-01 ENCOUNTER — Inpatient Hospital Stay (HOSPITAL_COMMUNITY)
Admission: EM | Admit: 2018-05-01 | Discharge: 2018-05-03 | DRG: 190 | Disposition: A | Discharge status: Home / Self Care | Payer: Medicaid Other | Attending: Family Medicine | Admitting: Family Medicine

## 2018-05-01 ENCOUNTER — Other Ambulatory Visit: Payer: Self-pay

## 2018-05-01 ENCOUNTER — Emergency Department (HOSPITAL_COMMUNITY): Payer: Medicaid Other

## 2018-05-01 ENCOUNTER — Observation Stay (HOSPITAL_COMMUNITY): Payer: Medicaid Other

## 2018-05-01 DIAGNOSIS — E119 Type 2 diabetes mellitus without complications: Secondary | ICD-10-CM | POA: Diagnosis present

## 2018-05-01 DIAGNOSIS — J9621 Acute and chronic respiratory failure with hypoxia: Secondary | ICD-10-CM | POA: Diagnosis present

## 2018-05-01 DIAGNOSIS — J441 Chronic obstructive pulmonary disease with (acute) exacerbation: Principal | ICD-10-CM | POA: Diagnosis present

## 2018-05-01 DIAGNOSIS — E785 Hyperlipidemia, unspecified: Secondary | ICD-10-CM | POA: Diagnosis present

## 2018-05-01 DIAGNOSIS — Z8249 Family history of ischemic heart disease and other diseases of the circulatory system: Secondary | ICD-10-CM

## 2018-05-01 DIAGNOSIS — Z801 Family history of malignant neoplasm of trachea, bronchus and lung: Secondary | ICD-10-CM

## 2018-05-01 DIAGNOSIS — F319 Bipolar disorder, unspecified: Secondary | ICD-10-CM | POA: Diagnosis present

## 2018-05-01 DIAGNOSIS — Z85038 Personal history of other malignant neoplasm of large intestine: Secondary | ICD-10-CM

## 2018-05-01 DIAGNOSIS — A084 Viral intestinal infection, unspecified: Secondary | ICD-10-CM

## 2018-05-01 DIAGNOSIS — Z7984 Long term (current) use of oral hypoglycemic drugs: Secondary | ICD-10-CM

## 2018-05-01 DIAGNOSIS — E860 Dehydration: Secondary | ICD-10-CM | POA: Diagnosis present

## 2018-05-01 DIAGNOSIS — N401 Enlarged prostate with lower urinary tract symptoms: Secondary | ICD-10-CM | POA: Diagnosis present

## 2018-05-01 DIAGNOSIS — Z8546 Personal history of malignant neoplasm of prostate: Secondary | ICD-10-CM

## 2018-05-01 DIAGNOSIS — D649 Anemia, unspecified: Secondary | ICD-10-CM | POA: Diagnosis present

## 2018-05-01 DIAGNOSIS — E875 Hyperkalemia: Secondary | ICD-10-CM | POA: Diagnosis not present

## 2018-05-01 DIAGNOSIS — R34 Anuria and oliguria: Secondary | ICD-10-CM

## 2018-05-01 DIAGNOSIS — Z7951 Long term (current) use of inhaled steroids: Secondary | ICD-10-CM

## 2018-05-01 DIAGNOSIS — E872 Acidosis: Secondary | ICD-10-CM | POA: Diagnosis present

## 2018-05-01 DIAGNOSIS — Z87891 Personal history of nicotine dependence: Secondary | ICD-10-CM

## 2018-05-01 DIAGNOSIS — Z885 Allergy status to narcotic agent status: Secondary | ICD-10-CM

## 2018-05-01 DIAGNOSIS — R0689 Other abnormalities of breathing: Secondary | ICD-10-CM

## 2018-05-01 DIAGNOSIS — J9622 Acute and chronic respiratory failure with hypercapnia: Secondary | ICD-10-CM

## 2018-05-01 DIAGNOSIS — E871 Hypo-osmolality and hyponatremia: Secondary | ICD-10-CM | POA: Diagnosis present

## 2018-05-01 LAB — COMPREHENSIVE METABOLIC PANEL
ALT: 24 U/L (ref 0–44)
AST: 19 U/L (ref 15–41)
Albumin: 3.9 g/dL (ref 3.5–5.0)
Alkaline Phosphatase: 46 U/L (ref 38–126)
Anion gap: 10 (ref 5–15)
BUN: 20 mg/dL (ref 8–23)
CO2: 32 mmol/L (ref 22–32)
Calcium: 8.6 mg/dL — ABNORMAL LOW (ref 8.9–10.3)
Chloride: 91 mmol/L — ABNORMAL LOW (ref 98–111)
Creatinine, Ser: 0.71 mg/dL (ref 0.61–1.24)
GFR calc Af Amer: >60 mL/min (ref >60)
GFR calc non Af Amer: >60 mL/min (ref >60)
Glucose, Bld: 133 mg/dL — ABNORMAL HIGH (ref 70–99)
Potassium: 4.5 mmol/L (ref 3.5–5.1)
Sodium: 133 mmol/L — ABNORMAL LOW (ref 135–145)
Total Bilirubin: 0.6 mg/dL (ref 0.3–1.2)
Total Protein: 6 g/dL — ABNORMAL LOW (ref 6.5–8.1)

## 2018-05-01 LAB — CBC WITH DIFFERENTIAL/PLATELET
Abs Immature Granulocytes: 0.11 10*3/uL — ABNORMAL HIGH (ref 0.00–0.07)
Basophils Absolute: 0 10*3/uL (ref 0.0–0.1)
Basophils Relative: 0 %
Eosinophils Absolute: 0.1 10*3/uL (ref 0.0–0.5)
Eosinophils Relative: 1 %
HCT: 39.8 % (ref 39.0–52.0)
Hemoglobin: 12.6 g/dL — ABNORMAL LOW (ref 13.0–17.0)
Immature Granulocytes: 1 %
Lymphocytes Relative: 4 %
Lymphs Abs: 0.3 10*3/uL — ABNORMAL LOW (ref 0.7–4.0)
MCH: 30.4 pg (ref 26.0–34.0)
MCHC: 31.7 g/dL (ref 30.0–36.0)
MCV: 95.9 fL (ref 80.0–100.0)
Monocytes Absolute: 0.6 10*3/uL (ref 0.1–1.0)
Monocytes Relative: 7 %
Neutro Abs: 7.6 10*3/uL (ref 1.7–7.7)
Neutrophils Relative %: 87 %
Platelets: 286 10*3/uL (ref 150–400)
RBC: 4.15 MIL/uL — ABNORMAL LOW (ref 4.22–5.81)
RDW: 13.8 % (ref 11.5–15.5)
WBC: 8.7 10*3/uL (ref 4.0–10.5)
nRBC: 0 % (ref 0.0–0.2)

## 2018-05-01 LAB — POCT I-STAT 7, (LYTES, BLD GAS, ICA,H+H)
Acid-Base Excess: 11 mmol/L — ABNORMAL HIGH (ref 0.0–2.0)
Bicarbonate: 40 mmol/L — ABNORMAL HIGH (ref 20.0–28.0)
Calcium, Ion: 1.21 mmol/L (ref 1.15–1.40)
HCT: 35 % — ABNORMAL LOW (ref 39.0–52.0)
Hemoglobin: 11.9 g/dL — ABNORMAL LOW (ref 13.0–17.0)
O2 Saturation: 100 %
Patient temperature: 98.1
Potassium: 4.8 mmol/L (ref 3.5–5.1)
Sodium: 132 mmol/L — ABNORMAL LOW (ref 135–145)
TCO2: 42 mmol/L — ABNORMAL HIGH (ref 22–32)
pCO2 arterial: 80.4 mmHg (ref 32.0–48.0)
pH, Arterial: 7.303 — ABNORMAL LOW (ref 7.350–7.450)
pO2, Arterial: 212 mmHg — ABNORMAL HIGH (ref 83.0–108.0)

## 2018-05-01 LAB — POCT I-STAT EG7
Acid-Base Excess: 6 mmol/L — ABNORMAL HIGH (ref 0.0–2.0)
Bicarbonate: 36 mmol/L — ABNORMAL HIGH (ref 20.0–28.0)
Calcium, Ion: 1.22 mmol/L (ref 1.15–1.40)
HCT: 38 % — ABNORMAL LOW (ref 39.0–52.0)
Hemoglobin: 12.9 g/dL — ABNORMAL LOW (ref 13.0–17.0)
O2 Saturation: 87 %
Potassium: 4.7 mmol/L (ref 3.5–5.1)
Sodium: 134 mmol/L — ABNORMAL LOW (ref 135–145)
TCO2: 38 mmol/L — ABNORMAL HIGH (ref 22–32)
pCO2, Ven: 78.7 mmHg (ref 44.0–60.0)
pH, Ven: 7.269 (ref 7.250–7.430)
pO2, Ven: 64 mmHg — ABNORMAL HIGH (ref 32.0–45.0)

## 2018-05-01 LAB — LACTIC ACID, PLASMA: Lactic Acid, Venous: 1.3 mmol/L (ref 0.5–1.9)

## 2018-05-01 LAB — BRAIN NATRIURETIC PEPTIDE: B Natriuretic Peptide: 45.6 pg/mL (ref 0.0–100.0)

## 2018-05-01 LAB — TROPONIN I: Troponin I: <0.03 ng/mL (ref <0.03)

## 2018-05-01 MED ORDER — ESCITALOPRAM OXALATE 10 MG PO TABS
20.0000 mg | ORAL_TABLET | Freq: Every day | ORAL | Status: DC
  Administered 2018-05-02 – 2018-05-03 (×2): 20 mg via ORAL
  Filled 2018-05-01 (×2): qty 2

## 2018-05-01 MED ORDER — AZITHROMYCIN 250 MG PO TABS
250.0000 mg | ORAL_TABLET | Freq: Every day | ORAL | Status: DC
  Administered 2018-05-02: 250 mg via ORAL
  Filled 2018-05-01: qty 1

## 2018-05-01 MED ORDER — SODIUM CHLORIDE 0.9 % IV SOLN
INTRAVENOUS | Status: DC
  Administered 2018-05-02: 01:00:00 via INTRAVENOUS

## 2018-05-01 MED ORDER — INSULIN ASPART 100 UNIT/ML ~~LOC~~ SOLN
0.0000 [IU] | Freq: Three times a day (TID) | SUBCUTANEOUS | Status: DC
  Administered 2018-05-02: 09:00:00 3 [IU] via SUBCUTANEOUS

## 2018-05-01 MED ORDER — ALBUTEROL SULFATE (2.5 MG/3ML) 0.083% IN NEBU: 5.0000 mg | INHALATION_SOLUTION | RESPIRATORY_TRACT | Status: DC | PRN

## 2018-05-01 MED ORDER — IOHEXOL 350 MG/ML SOLN
85.0000 mL | Freq: Once | INTRAVENOUS | Status: AC | PRN
  Administered 2018-05-01: 23:00:00 85 mL via INTRAVENOUS

## 2018-05-01 MED ORDER — ENOXAPARIN SODIUM 40 MG/0.4ML ~~LOC~~ SOLN
40.0000 mg | Freq: Every day | SUBCUTANEOUS | Status: DC
  Administered 2018-05-02 – 2018-05-03 (×2): 40 mg via SUBCUTANEOUS
  Filled 2018-05-01 (×2): qty 0.4

## 2018-05-01 MED ORDER — BUDESONIDE 0.5 MG/2ML IN SUSP
0.5000 mg | Freq: Two times a day (BID) | RESPIRATORY_TRACT | Status: DC
  Administered 2018-05-02 – 2018-05-03 (×3): 0.5 mg via RESPIRATORY_TRACT
  Filled 2018-05-01 (×3): qty 2

## 2018-05-01 MED ORDER — ALBUTEROL SULFATE HFA 108 (90 BASE) MCG/ACT IN AERS
4.0000 | INHALATION_SPRAY | Freq: Once | RESPIRATORY_TRACT | Status: AC
  Administered 2018-05-01: 21:00:00 4 via RESPIRATORY_TRACT
  Filled 2018-05-01: qty 6.7

## 2018-05-01 MED ORDER — ARIPIPRAZOLE 5 MG PO TABS
5.0000 mg | ORAL_TABLET | Freq: Every day | ORAL | Status: DC
  Administered 2018-05-02 – 2018-05-03 (×2): 5 mg via ORAL
  Filled 2018-05-01 (×2): qty 1

## 2018-05-01 MED ORDER — LOPERAMIDE HCL 2 MG PO CAPS: 2.0000 mg | ORAL_CAPSULE | ORAL | Status: DC | PRN

## 2018-05-01 MED ORDER — TAMSULOSIN HCL 0.4 MG PO CAPS
0.4000 mg | ORAL_CAPSULE | Freq: Every day | ORAL | Status: DC
  Administered 2018-05-02: 0.4 mg via ORAL

## 2018-05-01 MED ORDER — METHYLPREDNISOLONE SODIUM SUCC 125 MG IJ SOLR
60.0000 mg | Freq: Three times a day (TID) | INTRAMUSCULAR | Status: AC
  Administered 2018-05-02 (×3): 60 mg via INTRAVENOUS
  Filled 2018-05-01 (×4): qty 2

## 2018-05-01 MED ORDER — METHYLPREDNISOLONE SODIUM SUCC 125 MG IJ SOLR
125.0000 mg | Freq: Once | INTRAMUSCULAR | Status: AC
  Administered 2018-05-01: 125 mg via INTRAVENOUS
  Filled 2018-05-01: qty 2

## 2018-05-01 MED ORDER — ACETAMINOPHEN 650 MG RE SUPP: 650.0000 mg | Freq: Four times a day (QID) | RECTAL | Status: DC | PRN

## 2018-05-01 MED ORDER — AZITHROMYCIN 250 MG PO TABS
500.0000 mg | ORAL_TABLET | Freq: Once | ORAL | Status: AC
  Administered 2018-05-02: 01:00:00 500 mg via ORAL
  Filled 2018-05-01: qty 2

## 2018-05-01 MED ORDER — IPRATROPIUM-ALBUTEROL 0.5-2.5 (3) MG/3ML IN SOLN
3.0000 mL | Freq: Four times a day (QID) | RESPIRATORY_TRACT | Status: DC
  Administered 2018-05-02 – 2018-05-03 (×4): 3 mL via RESPIRATORY_TRACT
  Filled 2018-05-01 (×5): qty 3

## 2018-05-01 MED ORDER — ACETAMINOPHEN 325 MG PO TABS
650.0000 mg | ORAL_TABLET | Freq: Four times a day (QID) | ORAL | Status: DC | PRN
  Administered 2018-05-02: 01:00:00 650 mg via ORAL
  Filled 2018-05-01: qty 2

## 2018-05-01 NOTE — H&P (Signed)
History and Physical    SMILEY BIRR ZOX:[id number] DOB: 18-Nov-1955 DOA: 05/01/2018  PCP: Clent Demark, PA-C Patient coming from: Home  Chief Complaint: Shortness of breath  HPI: Taylor [name] is a 63 y.o. male with medical history significant of COPD and chronic hypoxic hypercarbic respiratory failure on 3 L home oxygen, asthma, bipolar disorder, colon cancer, depression, prostate cancer on radiation therapy, type 2 diabetes, hyperlipidemia presenting to the hospital for evaluation of shortness of breath.  Patient states since he left the hospital 2 days ago he has been feeling very short of breath.  Denies any chest pain, wheezing, or cough.  Denies any fevers or chills.  Reports compliance with home COPD inhalers.  States he is also having a small amount of nonbloody diarrhea and some slight abdominal discomfort when he tries to drink water.  No abdominal pain otherwise.  He is feeling nauseous but has not vomited.  States he is afraid to eat or drink anything.  He has not urinated much.  Patient was recently admitted from April 4 to April 8 for acute on chronic hypercarbic/hypoxic respiratory failure secondary to COPD exacerbation and acute urinary retention.  Respiratory viral panel in COVID-19 negative.  His acute urinary retention resolved and Foley catheter was removed on April 8.  Review of Systems: As per HPI otherwise 10 point review of systems negative.  Past Medical History:  Diagnosis Date   Acute on chronic respiratory failure (HCC)    Asthma    Bipolar 1 disorder ([place]) 02/05/2012   Colon cancer ([place]) 04/11/11   adenocarcinoma of colon, 7/19 nodes pos.FINISHED CHEMO/DR. SHERRILL   COPD (chronic obstructive pulmonary disease) ([place])    SMOKER   Depression    Dyspnea    Emphysema of lung ([place])    Full dentures    Hemorrhoids    On home oxygen therapy    "3L; 24/7" (05/29/2017)   Oxygen deficiency    Pneumonia ~ 2016   "double pneumonia"    Prostate cancer ([place])    Gleason score = 7, supposed to have radiation therapy but he has not followed up (05/29/2017)   Rib fractures    hx of    Past Surgical History:  Procedure Laterality Date   COLON SURGERY  04/11/11   Sigmoid colectomy   COLOSTOMY REVISION  04/11/2011   Procedure: COLON RESECTION SIGMOID;  Surgeon: [name], MD;  Location: [facility];  Service: General;  Laterality: N/A;  low anterior colon resection    GOLD SEED IMPLANT N/A 02/24/2018   Procedure: GOLD SEED IMPLANT;  Surgeon: [name], MD;  Location: [facility];  Service: Urology;  Laterality: N/A;   HERNIA REPAIR     INGUINAL HERNIA REPAIR Right 05/01/2012   Procedure: HERNIA REPAIR INGUINAL ADULT;  Surgeon: [name], MD;  Location: [facility];  Service: General;  Laterality: Right;   INSERTION OF MESH Right 05/01/2012   Procedure: INSERTION OF MESH;  Surgeon: [name], MD;  Location: [facility];  Service: General;  Laterality: Right;   PORT-A-CATH REMOVAL Left 05/01/2012   Procedure: REMOVAL Infusion Port;  Surgeon: [name], MD;  Location: [facility];  Service: General;  Laterality: Left;   PORTACATH PLACEMENT  05/02/2011   Procedure: INSERTION PORT-A-CATH;  Surgeon: [name], MD;  Location: [facility];  Service: General;  Laterality: N/A;   PROSTATE BIOPSY     SPACE OAR INSTILLATION N/A 02/24/2018   Procedure: SPACE OAR INSTILLATION;  Surgeon: [name],  MD;  Location: [facility];  Service: Urology;  Laterality: N/A;     reports that he quit smoking about a year ago. His smoking use included cigarettes. He has a 4.60 pack-year smoking history. He has quit using smokeless tobacco.  His smokeless tobacco use included chew. He reports previous alcohol use. He reports that he does not use drugs.  Allergies  Allergen Reactions   Codeine Nausea And Vomiting    Family History  Problem Relation Age of Onset   Heart disease Father    Heart failure Mother    Heart disease Mother    Lung cancer Maternal  Uncle        smoked    Prior to Admission medications   Medication Sig Start Date End Date Taking? Authorizing Provider  albuterol (PROVENTIL HFA) 108 (90 Base) MCG/ACT inhaler Inhale 2 puffs into the lungs every 6 (six) hours as needed for wheezing or shortness of breath.   Yes [provider]  albuterol (PROVENTIL) (2.5 MG/3ML) 0.083% nebulizer solution Take 3 mLs (2.5 mg total) by nebulization every 4 (four) hours as needed for wheezing or shortness of breath (if you can't catch your breath). 04/23/18  Yes Harris, Abigail, PA-C  ARIPiprazole (ABILIFY) 5 MG tablet Take 1 tablet (5 mg total) by mouth daily. 04/29/18  Yes Ghimire, Henreitta Leber, MD  escitalopram (LEXAPRO) 20 MG tablet Take 1 tablet (20 mg total) by mouth daily. 04/29/18  Yes Ghimire, Henreitta Leber, MD  loperamide (IMODIUM) 2 MG capsule Take 2 every 6 hours if continued diarrhea Patient taking differently: Take 2-4 mg by mouth See admin instructions. Take 2 mg by mouth as needed for diarrhea or loose stools and 4 mg every 6 hours if diarrhea continues 04/10/18  Yes [name], MD  lovastatin (MEVACOR) 20 MG tablet Take 1 tablet (20 mg total) by mouth at bedtime. 10/27/17  Yes Clent Demark, PA-C  metFORMIN (GLUCOPHAGE) 1000 MG tablet Take 1 tablet (1,000 mg total) by mouth 2 (two) times daily with a meal. 10/27/17  Yes Clent Demark, PA-C  metoprolol tartrate (LOPRESSOR) 25 MG tablet Take 0.5 tablets (12.5 mg total) by mouth 2 (two) times daily for 60 doses. 04/29/18 05/29/18 Yes Ghimire, Henreitta Leber, MD  mometasone-formoterol (DULERA) 100-5 MCG/ACT AERO Inhale 2 puffs into the lungs 2 (two) times daily. 04/23/18  Yes Harris, Abigail, PA-C  OXYGEN Inhale 3 L into the lungs continuous.    Yes [provider]  tamsulosin (FLOMAX) 0.4 MG CAPS capsule Take 1 capsule (0.4 mg total) by mouth daily. 03/13/18  Yes Shelly Coss, MD  Blood Glucose Monitoring Suppl (ACCU-CHEK AVIVA) device Use as instructed 10/27/17 10/27/18  Clent Demark, PA-C  glucose blood (ACCU-CHEK AVIVA) test strip 1 each by Other route as needed for other. Use as instructed 10/27/17   Clent Demark, PA-C  Lancets [facility]) lancets 1 each by Other route 2 (two) times daily. Use as instructed 10/27/17   Clent Demark, PA-C  predniSONE (DELTASONE) 10 MG tablet Take 40 mg daily for 2 days, 30 mg daily for 2 days, 20 mg daily for 2 days,10 mg daily for 1 days, then stop 04/29/18   Jonetta Osgood, MD  tiotropium (SPIRIVA) 18 MCG inhalation capsule Place 1 capsule (18 mcg total) into inhaler and inhale daily. Patient not taking: Reported on 05/01/2018 04/06/18   [name], MD    Physical Exam: Vitals:   05/02/18 0015 05/02/18 0030 05/02/18 1194 05/02/18 0101  BP: 100/83 113/84  (!) 123/93  Pulse: (!) 112 (!) 109  (!) 123  Resp: 18 20    Temp:    98.2 F (36.8 C)  TempSrc:    Oral  SpO2: 98% 100%  100%  Weight:   71.7 kg   Height:   5\' 11"  (1.803 m)     Physical Exam  Constitutional: He is oriented to person, place, and time. He appears well-developed and well-nourished. No distress.  HENT:  Head: Normocephalic.  Eyes: Right eye exhibits no discharge. Left eye exhibits no discharge.  Neck: Neck supple.  Cardiovascular: Normal rate, regular rhythm and intact distal pulses.  Pulmonary/Chest: He has wheezes. He has no rales.  Diminish air entry bilaterally  Abdominal: Soft. Bowel sounds are normal. He exhibits no distension. There is abdominal tenderness. There is guarding. There is no rebound.  Generalized tenderness with guarding  Musculoskeletal:        General: No edema.  Neurological: He is alert and oriented to person, place, and time.  Skin: Skin is warm and dry. He is not diaphoretic.     Labs on Admission: I have personally reviewed following labs and imaging studies  CBC: Recent Labs  Lab 04/25/18 2022 04/26/18 0823 04/28/18 0243 04/29/18 0259 05/01/18 2019 05/01/18 2044 05/01/18 2243    WBC 5.7 6.1 6.2 6.5 8.7  --   --   NEUTROABS 4.4 4.9 5.8 6.0 7.6  --   --   HGB 13.0 11.9* 10.7* 11.3* 12.6* 12.9* 11.9*  HCT 40.1 35.9* 32.2* 34.1* 39.8 38.0* 35.0*  MCV 94.6 94.0 92.0 93.9 95.9  --   --   PLT 254 249 226 228 286  --   --    Basic Metabolic Panel: Recent Labs  Lab 04/25/18 2022 04/26/18 0823 04/27/18 0634 05/01/18 2019 05/01/18 2044 05/01/18 2243  NA 129* 132* 130* 133* 134* 132*  K 4.7 4.7 3.9 4.5 4.7 4.8  CL 84* 91* 86* 91*  --   --   CO2 33* 30 36* 32  --   --   GLUCOSE 139* 110* 124* 133*  --   --   BUN 17 18 14 20   --   --   CREATININE 0.70 0.75 0.65 0.71  --   --   CALCIUM 8.7* 8.3* 7.9* 8.6*  --   --    GFR: Estimated Creatinine Clearance: 95.8 mL/min (by C-G formula based on SCr of 0.71 mg/dL). Liver Function Tests: Recent Labs  Lab 04/26/18 0212 04/26/18 0823 04/27/18 0634 05/01/18 2019  AST 31 30 23 19   ALT 25 27 20 24   ALKPHOS 41 43 39 46  BILITOT 0.5 0.5 0.2* 0.6  PROT 6.1* 6.2* 4.8* 6.0*  ALBUMIN 3.9 4.1 3.3* 3.9   No results for input(s): LIPASE, AMYLASE in the last 168 hours. No results for input(s): AMMONIA in the last 168 hours. Coagulation Profile: No results for input(s): INR, PROTIME in the last 168 hours. Cardiac Enzymes: Recent Labs  Lab 04/26/18 1152 05/01/18 2019  TROPONINI <0.03 <0.03   BNP (last 3 results) No results for input(s): PROBNP in the last 8760 hours. HbA1C: No results for input(s): HGBA1C in the last 72 hours. CBG: Recent Labs  Lab 04/28/18 2124 04/29/18 0649 04/29/18 0806 04/29/18 1132 05/02/18 0058  GLUCAP 85 173* 167* 89 122*   Lipid Profile: No results for input(s): CHOL, HDL, LDLCALC, TRIG, CHOLHDL, LDLDIRECT in the last 72 hours. Thyroid Function Tests: No results for input(s): TSH, T4TOTAL, FREET4,  T3FREE, THYROIDAB in the last 72 hours. Anemia Panel: No results for input(s): VITAMINB12, FOLATE, FERRITIN, TIBC, IRON, RETICCTPCT in the last 72 hours. Urine analysis:    Component  Value Date/Time   COLORURINE YELLOW 04/25/2018 [place] 04/25/2018 2345   LABSPEC 1.014 04/25/2018 2345   PHURINE 6.0 04/25/2018 2345   GLUCOSEU NEGATIVE 04/25/2018 2345   HGBUR NEGATIVE 04/25/2018 2345   BILIRUBINUR NEGATIVE 04/25/2018 2345   KETONESUR 20 (A) 04/25/2018 2345   PROTEINUR 30 (A) 04/25/2018 2345   UROBILINOGEN 1.0 01/09/2013 1843   NITRITE NEGATIVE 04/25/2018 2345   LEUKOCYTESUR NEGATIVE 04/25/2018 2345    Radiological Exams on Admission: Ct Angio Chest Pe W Or Wo Contrast  Result Date: 05/01/2018 CLINICAL DATA:  Shortness of breath Suspicion for COVID-19 as well; Abd pain, acute, generalized. Increasing shortness of breath, generalized weakness and urinary retention for 2 days. Symptom progression after hospital discharge 2 days ago for urinary retention. EXAM: CT ANGIOGRAPHY CHEST CT ABDOMEN AND PELVIS WITH CONTRAST TECHNIQUE: Multidetector CT imaging of the chest was performed using the standard protocol during bolus administration of intravenous contrast. Multiplanar CT image reconstructions and MIPs were obtained to evaluate the vascular anatomy. Multidetector CT imaging of the abdomen and pelvis was performed using the standard protocol during bolus administration of intravenous contrast. CONTRAST:  81mL OMNIPAQUE IOHEXOL 350 MG/ML SOLN COMPARISON:  Chest radiograph earlier this day. Abdominal CT 5 days prior 04/26/2018 FINDINGS: CTA CHEST FINDINGS Cardiovascular: There are no filling defects within the pulmonary arteries to suggest pulmonary embolus. Mild aortic atherosclerosis without aneurysm. No aortic dissection. Heart is normal in size. There are coronary artery calcifications. Trace pericardial effusion. Mediastinum/Nodes: No enlarged mediastinal or hilar lymph nodes. Esophagus is decompressed. No visualized thyroid nodule. Lungs/Pleura: Emphysema. Blooming artifact projecting over the right lower lobe from external artifact from monitoring device. No  acute airspace disease, pulmonary edema, or pleural fluid. No suspicious nodule or mass. Trachea and mainstem bronchi are patent. Musculoskeletal: There are no acute or suspicious osseous abnormalities. Multiple Schmorl's nodes in the thoracic spine. Remote posterior right upper rib fractures. Review of the MIP images confirms the above findings. CT ABDOMEN and PELVIS FINDINGS Hepatobiliary: No focal liver abnormality is seen. No gallstones, gallbladder wall thickening, or biliary dilatation. Pancreas: No ductal dilatation or inflammation. Spleen: Normal in size without focal abnormality. Adrenals/Urinary Tract: No adrenal nodule. No hydronephrosis or perinephric edema. Homogeneous renal enhancement with symmetric excretion on delayed phase imaging. Small cysts within both kidneys. Urinary bladder is partially distended. Mild bladder wall thickening. Previous air in the urinary bladder has resolved. Stomach/Bowel: Small hiatal hernia. Stomach physiologically distended. No bowel wall thickening or inflammatory change. Colonic diverticulosis most prominent in the distal colon without diverticulitis. Enteric chain suture noted at the rectosigmoid junction. Normal appendix. Vascular/Lymphatic: Aortic atherosclerosis. No aneurysm. Circumaortic left renal vein. Portal vein and mesenteric vessels are patent. No enlarged lymph nodes in the abdomen or pelvis. Reproductive: Biopsy clips in the prostate gland. No significant change in small low-density fluid collection in the rectovesical space just posterior to the prostate gland. No peripheral enhancement. Other: Scattered edema in the anterior abdominal wall, typical of medication injection site. No free air in the abdomen. No ascites. Minimal fat in both inguinal canals. Musculoskeletal: Degenerative change in the spine and both hips. Vague subchondral sclerosis involving both femoral heads may be degenerative or a vascular necrosis without subchondral collapse. Review of  the MIP images confirms the above findings. IMPRESSION: 1. No pulmonary embolus. 2.  Moderate to advanced emphysema. No acute chest finding. No airspace disease to suggest pneumonia. 3. Mild bladder wall thickening, suspect this is chronic. Unchanged low-density fluid in the rectovesical space over the past 5 days, likely post therapy changes. 4. Colonic diverticulosis without diverticulitis. 5. Additional chronic findings are stable as described. Aortic Atherosclerosis (ICD10-I70.0) and Emphysema (ICD10-J43.9). Electronically Signed   By: Keith Rake M.D.   On: 05/01/2018 23:50   Ct Abdomen Pelvis W Contrast  Result Date: 05/01/2018 CLINICAL DATA:  Shortness of breath Suspicion for COVID-19 as well; Abd pain, acute, generalized. Increasing shortness of breath, generalized weakness and urinary retention for 2 days. Symptom progression after hospital discharge 2 days ago for urinary retention. EXAM: CT ANGIOGRAPHY CHEST CT ABDOMEN AND PELVIS WITH CONTRAST TECHNIQUE: Multidetector CT imaging of the chest was performed using the standard protocol during bolus administration of intravenous contrast. Multiplanar CT image reconstructions and MIPs were obtained to evaluate the vascular anatomy. Multidetector CT imaging of the abdomen and pelvis was performed using the standard protocol during bolus administration of intravenous contrast. CONTRAST:  84mL OMNIPAQUE IOHEXOL 350 MG/ML SOLN COMPARISON:  Chest radiograph earlier this day. Abdominal CT 5 days prior 04/26/2018 FINDINGS: CTA CHEST FINDINGS Cardiovascular: There are no filling defects within the pulmonary arteries to suggest pulmonary embolus. Mild aortic atherosclerosis without aneurysm. No aortic dissection. Heart is normal in size. There are coronary artery calcifications. Trace pericardial effusion. Mediastinum/Nodes: No enlarged mediastinal or hilar lymph nodes. Esophagus is decompressed. No visualized thyroid nodule. Lungs/Pleura: Emphysema. Blooming  artifact projecting over the right lower lobe from external artifact from monitoring device. No acute airspace disease, pulmonary edema, or pleural fluid. No suspicious nodule or mass. Trachea and mainstem bronchi are patent. Musculoskeletal: There are no acute or suspicious osseous abnormalities. Multiple Schmorl's nodes in the thoracic spine. Remote posterior right upper rib fractures. Review of the MIP images confirms the above findings. CT ABDOMEN and PELVIS FINDINGS Hepatobiliary: No focal liver abnormality is seen. No gallstones, gallbladder wall thickening, or biliary dilatation. Pancreas: No ductal dilatation or inflammation. Spleen: Normal in size without focal abnormality. Adrenals/Urinary Tract: No adrenal nodule. No hydronephrosis or perinephric edema. Homogeneous renal enhancement with symmetric excretion on delayed phase imaging. Small cysts within both kidneys. Urinary bladder is partially distended. Mild bladder wall thickening. Previous air in the urinary bladder has resolved. Stomach/Bowel: Small hiatal hernia. Stomach physiologically distended. No bowel wall thickening or inflammatory change. Colonic diverticulosis most prominent in the distal colon without diverticulitis. Enteric chain suture noted at the rectosigmoid junction. Normal appendix. Vascular/Lymphatic: Aortic atherosclerosis. No aneurysm. Circumaortic left renal vein. Portal vein and mesenteric vessels are patent. No enlarged lymph nodes in the abdomen or pelvis. Reproductive: Biopsy clips in the prostate gland. No significant change in small low-density fluid collection in the rectovesical space just posterior to the prostate gland. No peripheral enhancement. Other: Scattered edema in the anterior abdominal wall, typical of medication injection site. No free air in the abdomen. No ascites. Minimal fat in both inguinal canals. Musculoskeletal: Degenerative change in the spine and both hips. Vague subchondral sclerosis involving both  femoral heads may be degenerative or a vascular necrosis without subchondral collapse. Review of the MIP images confirms the above findings. IMPRESSION: 1. No pulmonary embolus. 2. Moderate to advanced emphysema. No acute chest finding. No airspace disease to suggest pneumonia. 3. Mild bladder wall thickening, suspect this is chronic. Unchanged low-density fluid in the rectovesical space over the past 5 days, likely post therapy changes. 4. Colonic diverticulosis without  diverticulitis. 5. Additional chronic findings are stable as described. Aortic Atherosclerosis (ICD10-I70.0) and Emphysema (ICD10-J43.9). Electronically Signed   By: Keith Rake M.D.   On: 05/01/2018 23:50   Dg Chest Portable 1 View  Result Date: 05/01/2018 CLINICAL DATA:  Shortness of breath. EXAM: PORTABLE CHEST 1 VIEW COMPARISON:  Radiographs of April 25, 2018. FINDINGS: The heart size and mediastinal contours are within normal limits. Both lungs are clear. Hyperexpansion of the lungs is noted. No pneumothorax or pleural effusion is noted. Old right rib fractures are noted. IMPRESSION: No acute cardiopulmonary abnormality seen. Findings consistent with chronic obstructive pulmonary disease. Electronically Signed   By: Marijo Conception, M.D.   On: 05/01/2018 20:42    EKG: Independently reviewed.  Sinus tachycardia, LAE, no STEMI.  Assessment/Plan Principal Problem:   Acute on chronic respiratory failure with hypoxia and hypercapnia (HCC) Active Problems:   Bipolar 1 disorder (HCC)   COPD with acute exacerbation (HCC)   Oliguria   Viral gastroenteritis   Acute on chronic hypoxic hypercarbic respiratory failure secondary to acute exacerbation of chronic obstructive pulmonary disease -Wheezing on exam with decreased air entry bilaterally.  Tachycardic and tachypneic.  ABG with pH 7.30, PCO2 80, PO2 212.  Hypercarbia seems to be chronic.  Required 4 L supplemental oxygen in the ED (uses 3 L at home).   -Afebrile no leukocytosis.   Lactic acid normal.  BNP normal.  Troponin negative and EKG not suggestive of ACS.   -CTA negative for PE.  No airspace disease to suggest pneumonia.  Tested negative for COVID-19 during recent hospitalization. -Continue supplemental oxygen -DuoNeb scheduled -Albuterol as needed -Pulmicort twice daily -Received Solu-Medrol 125 mg in the ED.  Continue Solu-Medrol 60 mg every 8 hours. -Azithromycin given severity of disease -Repeat ABG in a.m.  Oliguria Noted to have acute urinary retention during recent hospitalization which resolved prior to discharge.  Suspect oliguria is secondary to dehydration as patient reports decreased p.o. intake.  No urine output since he has been in the ED. Per nursing staff, bladder scan revealed 85 cc.  CT showing mild bladder wall thickening, suspected to be chronic.  Unchanged low-density fluid in the rectovesical space over the past 5 days, likely post therapy changes. -IV fluid for dehydration -Continue to monitor urine output -Bladder scans every 2 hours  Viral gastroenteritis Patient is complaining of nausea and diarrhea.  No episodes of diarrhea in the hospital.  CT abdomen pelvis negative for acute finding.  LFTs normal. -GI pathogen panel if he has diarrhea in the hospital -Loperamide as needed -Zofran PRN  Chronic anemia -Stable.  Hemoglobin 12.6, at baseline. -Continue to monitor CBC  Mild chronic hyponatremia -Sodium 133.  Possibly due to decreased p.o. intake. -IV fluid hydration -Continue to monitor BMP  Bipolar disorder -Continue Abilify, Lexapro  Type 2 diabetes -A1c 7.7 on 1/30.  Sliding scale insulin and CBG checks.  BPH -Continue Flomax  DVT prophylaxis: Lovenox Code Status: Full code Family Communication: No family available. Disposition Plan: Anticipate discharge after clinical improvement. Consults called: None Admission status: Observation, progressive care unit  This chart was dictated using voice recognition  software.  Despite best efforts to proofread, errors can occur which can change the documentation meaning.  Shela Leff MD [facility] Pager [phone]  If 7PM-7AM, please contact night-coverage [email or web address] Password [email or web address]  05/02/2018, 1:46 AM
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
## 2018-05-01 NOTE — ED Triage Notes (Signed)
BIB EMS from home c/o incr'd SOB, generalized weakness, urinary retention for past 2 days. Admitted for 5 days last week for urinary retention, and d/c'd 2 days ago. States unable to void since arriving home. Decr'd food or fluid intake since then d/t fear of urinary retention.

## 2018-05-01 NOTE — ED Notes (Signed)
Patient transported to CT 

## 2018-05-02 DIAGNOSIS — R34 Anuria and oliguria: Secondary | ICD-10-CM | POA: Diagnosis present

## 2018-05-02 DIAGNOSIS — F319 Bipolar disorder, unspecified: Secondary | ICD-10-CM | POA: Diagnosis present

## 2018-05-02 DIAGNOSIS — E119 Type 2 diabetes mellitus without complications: Secondary | ICD-10-CM | POA: Diagnosis present

## 2018-05-02 DIAGNOSIS — N401 Enlarged prostate with lower urinary tract symptoms: Secondary | ICD-10-CM | POA: Diagnosis present

## 2018-05-02 DIAGNOSIS — Z885 Allergy status to narcotic agent status: Secondary | ICD-10-CM | POA: Diagnosis not present

## 2018-05-02 DIAGNOSIS — E872 Acidosis: Secondary | ICD-10-CM | POA: Diagnosis present

## 2018-05-02 DIAGNOSIS — R0602 Shortness of breath: Secondary | ICD-10-CM | POA: Diagnosis not present

## 2018-05-02 DIAGNOSIS — E785 Hyperlipidemia, unspecified: Secondary | ICD-10-CM | POA: Diagnosis present

## 2018-05-02 DIAGNOSIS — Z85038 Personal history of other malignant neoplasm of large intestine: Secondary | ICD-10-CM | POA: Diagnosis not present

## 2018-05-02 DIAGNOSIS — D649 Anemia, unspecified: Secondary | ICD-10-CM | POA: Diagnosis present

## 2018-05-02 DIAGNOSIS — E871 Hypo-osmolality and hyponatremia: Secondary | ICD-10-CM | POA: Diagnosis present

## 2018-05-02 DIAGNOSIS — Z8249 Family history of ischemic heart disease and other diseases of the circulatory system: Secondary | ICD-10-CM | POA: Diagnosis not present

## 2018-05-02 DIAGNOSIS — Z7984 Long term (current) use of oral hypoglycemic drugs: Secondary | ICD-10-CM | POA: Diagnosis not present

## 2018-05-02 DIAGNOSIS — J9622 Acute and chronic respiratory failure with hypercapnia: Secondary | ICD-10-CM | POA: Diagnosis present

## 2018-05-02 DIAGNOSIS — Z8546 Personal history of malignant neoplasm of prostate: Secondary | ICD-10-CM | POA: Diagnosis not present

## 2018-05-02 DIAGNOSIS — A084 Viral intestinal infection, unspecified: Secondary | ICD-10-CM

## 2018-05-02 DIAGNOSIS — J441 Chronic obstructive pulmonary disease with (acute) exacerbation: Secondary | ICD-10-CM | POA: Diagnosis present

## 2018-05-02 DIAGNOSIS — Z87891 Personal history of nicotine dependence: Secondary | ICD-10-CM | POA: Diagnosis not present

## 2018-05-02 DIAGNOSIS — J9621 Acute and chronic respiratory failure with hypoxia: Secondary | ICD-10-CM | POA: Diagnosis present

## 2018-05-02 DIAGNOSIS — Z7951 Long term (current) use of inhaled steroids: Secondary | ICD-10-CM | POA: Diagnosis not present

## 2018-05-02 DIAGNOSIS — E875 Hyperkalemia: Secondary | ICD-10-CM | POA: Diagnosis not present

## 2018-05-02 DIAGNOSIS — E860 Dehydration: Secondary | ICD-10-CM | POA: Diagnosis present

## 2018-05-02 DIAGNOSIS — Z801 Family history of malignant neoplasm of trachea, bronchus and lung: Secondary | ICD-10-CM | POA: Diagnosis not present

## 2018-05-02 LAB — BASIC METABOLIC PANEL
Anion gap: 9 (ref 5–15)
BUN: 20 mg/dL (ref 8–23)
CO2: 34 mmol/L — ABNORMAL HIGH (ref 22–32)
Calcium: 8.7 mg/dL — ABNORMAL LOW (ref 8.9–10.3)
Chloride: 91 mmol/L — ABNORMAL LOW (ref 98–111)
Creatinine, Ser: 0.8 mg/dL (ref 0.61–1.24)
GFR calc Af Amer: >60 mL/min (ref >60)
GFR calc non Af Amer: >60 mL/min (ref >60)
Glucose, Bld: 143 mg/dL — ABNORMAL HIGH (ref 70–99)
Potassium: 5.4 mmol/L — ABNORMAL HIGH (ref 3.5–5.1)
Sodium: 134 mmol/L — ABNORMAL LOW (ref 135–145)

## 2018-05-02 LAB — BLOOD GAS, ARTERIAL
Acid-Base Excess: 8.2 mmol/L — ABNORMAL HIGH (ref 0.0–2.0)
Bicarbonate: 34.6 mmol/L — ABNORMAL HIGH (ref 20.0–28.0)
Drawn by: 51155
O2 Content: 2 L/min
O2 Saturation: 97.6 %
Patient temperature: 98.6
pCO2 arterial: 73.2 mmHg (ref 32.0–48.0)
pH, Arterial: 7.296 — ABNORMAL LOW (ref 7.350–7.450)
pO2, Arterial: 100 mmHg (ref 83.0–108.0)

## 2018-05-02 LAB — BLOOD GAS, VENOUS
Acid-Base Excess: 10.3 mmol/L — ABNORMAL HIGH (ref 0.0–2.0)
Bicarbonate: 35.7 mmol/L — ABNORMAL HIGH (ref 20.0–28.0)
O2 Content: 3 L/min
O2 Saturation: 82.9 %
Patient temperature: 98.6
pCO2, Ven: 61.7 mmHg — ABNORMAL HIGH (ref 44.0–60.0)
pH, Ven: 7.38 (ref 7.250–7.430)
pO2, Ven: 47.1 mmHg — ABNORMAL HIGH (ref 32.0–45.0)

## 2018-05-02 LAB — GLUCOSE, CAPILLARY
Glucose-Capillary: 122 mg/dL — ABNORMAL HIGH (ref 70–99)
Glucose-Capillary: 224 mg/dL — ABNORMAL HIGH (ref 70–99)
Glucose-Capillary: 131 mg/dL — ABNORMAL HIGH (ref 70–99)
Glucose-Capillary: 141 mg/dL — ABNORMAL HIGH (ref 70–99)
Glucose-Capillary: 123 mg/dL — ABNORMAL HIGH (ref 70–99)

## 2018-05-02 LAB — CBC
HCT: 37.8 % — ABNORMAL LOW (ref 39.0–52.0)
Hemoglobin: 12.1 g/dL — ABNORMAL LOW (ref 13.0–17.0)
MCH: 30.6 pg (ref 26.0–34.0)
MCHC: 32 g/dL (ref 30.0–36.0)
MCV: 95.7 fL (ref 80.0–100.0)
Platelets: 262 10*3/uL (ref 150–400)
RBC: 3.95 MIL/uL — ABNORMAL LOW (ref 4.22–5.81)
RDW: 13.7 % (ref 11.5–15.5)
WBC: 8.1 10*3/uL (ref 4.0–10.5)
nRBC: 0 % (ref 0.0–0.2)

## 2018-05-02 LAB — POTASSIUM: Potassium: 4.6 mmol/L (ref 3.5–5.1)

## 2018-05-02 MED ORDER — LACTATED RINGERS IV BOLUS
1000.0000 mL | Freq: Once | INTRAVENOUS | Status: AC
  Administered 2018-05-02: 09:00:00 1000 mL via INTRAVENOUS

## 2018-05-02 MED ORDER — INSULIN ASPART 100 UNIT/ML ~~LOC~~ SOLN
0.0000 [IU] | Freq: Three times a day (TID) | SUBCUTANEOUS | Status: DC
  Administered 2018-05-02 (×2): 1 [IU] via SUBCUTANEOUS
  Administered 2018-05-03: 2 [IU] via SUBCUTANEOUS

## 2018-05-02 MED ORDER — SODIUM CHLORIDE 0.9% IV SOLUTION: Freq: Once | INTRAVENOUS | Status: DC

## 2018-05-02 MED ORDER — TAMSULOSIN HCL 0.4 MG PO CAPS
0.4000 mg | ORAL_CAPSULE | Freq: Once | ORAL | Status: AC
  Administered 2018-05-02: 11:00:00 0.4 mg via ORAL
  Filled 2018-05-02: qty 1

## 2018-05-02 MED ORDER — INSULIN ASPART 100 UNIT/ML ~~LOC~~ SOLN: 0.0000 [IU] | Freq: Every day | SUBCUTANEOUS | Status: DC

## 2018-05-02 MED ORDER — TAMSULOSIN HCL 0.4 MG PO CAPS
0.8000 mg | ORAL_CAPSULE | Freq: Every day | ORAL | Status: DC
  Administered 2018-05-03: 0.8 mg via ORAL
  Filled 2018-05-02: qty 2

## 2018-05-02 MED ORDER — PREDNISONE 20 MG PO TABS
40.0000 mg | ORAL_TABLET | Freq: Every day | ORAL | Status: DC
  Administered 2018-05-03: 40 mg via ORAL
  Filled 2018-05-02: qty 2

## 2018-05-02 MED ORDER — ONDANSETRON HCL 4 MG/2ML IJ SOLN
4.0000 mg | Freq: Four times a day (QID) | INTRAMUSCULAR | Status: DC | PRN
  Administered 2018-05-02 (×2): 4 mg via INTRAVENOUS
  Filled 2018-05-02 (×2): qty 2

## 2018-05-02 NOTE — Progress Notes (Signed)
RT NOTE:  ABG results reported to MD. RN is trying to find out if patient is COVID R/O. This RT was told no, however, the MD note states its possible. At this time BIPAP is not being used for COVID R/O patients. Pt is A&O. RT will wait for further instruction.

## 2018-05-02 NOTE — Progress Notes (Signed)
PROGRESS NOTE    Taylor [name]  WJX:[id number] DOB: February 23, 1955 DOA: 05/01/2018 PCP: Clent Demark, PA-C   Brief Narrative:   Taylor [name] is Taylor [name] 63 y.o. male with medical history significant of COPD and chronic hypoxic hypercarbic respiratory failure on 3 L home oxygen, asthma, bipolar disorder, colon cancer, depression, prostate cancer on radiation therapy, type 2 diabetes, hyperlipidemia presenting to the hospital for evaluation of shortness of breath.  Patient states since he left the hospital 2 days ago he has been feeling very short of breath.  Denies any chest pain, wheezing, or cough.  Denies any fevers or chills.  Reports compliance with home COPD inhalers.  States he is also having Taylor [name] small amount of nonbloody diarrhea and some slight abdominal discomfort when he tries to drink water.  No abdominal pain otherwise.  He is feeling nauseous but has not vomited.  States he is afraid to eat or drink anything.  He has not urinated much.  Patient was recently admitted from April 4 to April 8 for acute on chronic hypercarbic/hypoxic respiratory failure secondary to COPD exacerbation and acute urinary retention.  Respiratory viral panel in COVID-19 negative.  His acute urinary retention resolved and Foley catheter was removed on April 8.  Assessment & Plan:   Principal Problem:   Acute on chronic respiratory failure with hypoxia and hypercapnia (HCC) Active Problems:   Bipolar 1 disorder (HCC)   COPD with acute exacerbation (HCC)   Oliguria   Viral gastroenteritis   Acute on chronic hypoxic hypercarbic respiratory failure secondary to acute exacerbation of chronic obstructive pulmonary disease - persistent wheezing today, but on baseline O2 (3 L [place]) - ABG with persistent respiratory acidosis -> repeat VBG (he's alert and oriented and does not appear to have sx consistent with hypercarbia, but may trial bipap for her)  -CTA negative for PE.  No airspace disease to suggest  pneumonia.  Tested negative for COVID-19 during recent hospitalization. -Continue supplemental oxygen, wean to baseline O2 as tolerated -DuoNeb scheduled -Albuterol as needed -Pulmicort twice daily -On IV steroids, transition to PO tomorrow -Azithromycin given severity of disease  Acute Urinary Retention:  S/p I&O cath x1 with ~600 cc out Increase flomax to 0.8 mg If requires catherization again, will leave foley in place as this is Taylor [name] recurrent issue  Diarrhea: - Diarrhea for the past 2 days, since discharge - Follow C diff  - Loperamide as needed - Zofran PRN - CT with mild bladder wall thickening, unchanged low density fluid in the rectovesical space over past 5 days, likely post therapy changes  Sinus Tachycardia: follow with IVF, bolus this AM  Chronic anemia - Hb Stable, follow  Mild chronic hyponatremia -continue to monitor  Mild Hyperkalemia - repeat K this afternoon  Bipolar disorder -Continue Abilify, Lexapro  Type 2 diabetes -A1c 7.7 on metformin at home.  Sliding scale insulin and CBG checks.  BPH -Continue Flomax (increase as noted above)  DVT prophylaxis: lovenox Code Status: full  Family Communication: none at bedside Disposition Plan: pending   Consultants:   none  Procedures:   none  Antimicrobials:  Anti-infectives (From admission, onward)   Start     Dose/Rate Route Frequency Ordered Stop   05/02/18 2200  azithromycin (ZITHROMAX) tablet 250 mg     250 mg Oral Daily at bedtime 05/01/18 2317     05/02/18 0100  azithromycin (ZITHROMAX) tablet 500 mg     500 mg Oral  Once 05/01/18 2317 05/02/18 0110  Subjective: SOB is improved compared to yesterday, still not to baseline Most concerning thing is difficulty urinating and diarrhea Diarrhea since d/c ~ 2 days ago  Objective: Vitals:   05/02/18 0030 05/02/18 0052 05/02/18 0101 05/02/18 0730  BP: 113/84  (!) 123/93   Pulse: (!) 109  (!) 123   Resp: 20     Temp:   98.2 F  (36.8 C)   TempSrc:   Oral   SpO2: 100%  100% 100%  Weight:  71.7 kg    Height:  5\' 11"  (1.803 m)      Intake/Output Summary (Last 24 hours) at 05/02/2018 1136 Last data filed at 05/02/2018 1100 Gross per 24 hour  Intake --  Output 1475 ml  Net -1475 ml   Filed Weights   05/01/18 2022 05/02/18 0052  Weight: 75.8 kg 71.7 kg    Examination:  General exam: Appears calm and comfortable  Respiratory system: mild diffuse wheezing throughout, not with significantly increased wob Cardiovascular system: S1 & S2 heard, RRR.  Gastrointestinal system: Abdomen is nondistended, soft and nontender. Central nervous system: Alert and oriented. No focal neurological deficits. Extremities: no lee Skin: No rashes, lesions or ulcers Psychiatry: Judgement and insight appear normal. Mood & affect appropriate.     Data Reviewed: I have personally reviewed following labs and imaging studies  CBC: Recent Labs  Lab 04/25/18 2022 04/26/18 0823 04/28/18 0243 04/29/18 0259 05/01/18 2019 05/01/18 2044 05/01/18 2243 05/02/18 0243  WBC 5.7 6.1 6.2 6.5 8.7  --   --  8.1  NEUTROABS 4.4 4.9 5.8 6.0 7.6  --   --   --   HGB 13.0 11.9* 10.7* 11.3* 12.6* 12.9* 11.9* 12.1*  HCT 40.1 35.9* 32.2* 34.1* 39.8 38.0* 35.0* 37.8*  MCV 94.6 94.0 92.0 93.9 95.9  --   --  95.7  PLT 254 249 226 228 286  --   --  540   Basic Metabolic Panel: Recent Labs  Lab 04/25/18 2022 04/26/18 0823 04/27/18 0634 05/01/18 2019 05/01/18 2044 05/01/18 2243 05/02/18 0243  NA 129* 132* 130* 133* 134* 132* 134*  K 4.7 4.7 3.9 4.5 4.7 4.8 5.4*  CL 84* 91* 86* 91*  --   --  91*  CO2 33* 30 36* 32  --   --  34*  GLUCOSE 139* 110* 124* 133*  --   --  143*  BUN 17 18 14 20   --   --  20  CREATININE 0.70 0.75 0.65 0.71  --   --  0.80  CALCIUM 8.7* 8.3* 7.9* 8.6*  --   --  8.7*   GFR: Estimated Creatinine Clearance: 95.8 mL/min (by C-G formula based on SCr of 0.8 mg/dL). Liver Function Tests: Recent Labs  Lab  04/26/18 0212 04/26/18 0823 04/27/18 0634 05/01/18 2019  AST 31 30 23 19   ALT 25 27 20 24   ALKPHOS 41 43 39 46  BILITOT 0.5 0.5 0.2* 0.6  PROT 6.1* 6.2* 4.8* 6.0*  ALBUMIN 3.9 4.1 3.3* 3.9   No results for input(s): LIPASE, AMYLASE in the last 168 hours. No results for input(s): AMMONIA in the last 168 hours. Coagulation Profile: No results for input(s): INR, PROTIME in the last 168 hours. Cardiac Enzymes: Recent Labs  Lab 04/26/18 1152 05/01/18 2019  TROPONINI <0.03 <0.03   BNP (last 3 results) No results for input(s): PROBNP in the last 8760 hours. HbA1C: No results for input(s): HGBA1C in the last 72 hours. CBG: Recent Labs  Lab 04/29/18  5102 04/29/18 0806 04/29/18 1132 05/02/18 0058 05/02/18 0819  GLUCAP 173* 167* 89 122* 224*   Lipid Profile: No results for input(s): CHOL, HDL, LDLCALC, TRIG, CHOLHDL, LDLDIRECT in the last 72 hours. Thyroid Function Tests: No results for input(s): TSH, T4TOTAL, FREET4, T3FREE, THYROIDAB in the last 72 hours. Anemia Panel: No results for input(s): VITAMINB12, FOLATE, FERRITIN, TIBC, IRON, RETICCTPCT in the last 72 hours. Sepsis Labs: Recent Labs  Lab 04/26/18 0212 05/01/18 2020  PROCALCITON <0.10  --   LATICACIDVEN  --  1.3    Recent Results (from the past 240 hour(s))  Urine culture     Status: Abnormal   Collection Time: 04/25/18 11:45 PM  Result Value Ref Range Status   Specimen Description URINE, CLEAN CATCH  Final   Special Requests NONE  Final   Culture (Jami Bogdanski)  Final    <10,000 COLONIES/mL INSIGNIFICANT GROWTH Performed at [facility], [address], [place], [place] [address]    Report Status 04/27/2018 FINAL  Final  Respiratory Panel by PCR     Status: None   Collection Time: 04/26/18 12:39 AM  Result Value Ref Range Status   Adenovirus NOT DETECTED NOT DETECTED Final   Coronavirus 229E NOT DETECTED NOT DETECTED Final    Comment: (NOTE) The Coronavirus on the Respiratory Panel, DOES NOT test for  the novel  Coronavirus (2019 nCoV)    Coronavirus HKU1 NOT DETECTED NOT DETECTED Final   Coronavirus NL63 NOT DETECTED NOT DETECTED Final   Coronavirus OC43 NOT DETECTED NOT DETECTED Final   Metapneumovirus NOT DETECTED NOT DETECTED Final   Rhinovirus / Enterovirus NOT DETECTED NOT DETECTED Final   Influenza Deseri Loss NOT DETECTED NOT DETECTED Final   Influenza B NOT DETECTED NOT DETECTED Final   Parainfluenza Virus 1 NOT DETECTED NOT DETECTED Final   Parainfluenza Virus 2 NOT DETECTED NOT DETECTED Final   Parainfluenza Virus 3 NOT DETECTED NOT DETECTED Final   Parainfluenza Virus 4 NOT DETECTED NOT DETECTED Final   Respiratory Syncytial Virus NOT DETECTED NOT DETECTED Final   Bordetella pertussis NOT DETECTED NOT DETECTED Final   Chlamydophila pneumoniae NOT DETECTED NOT DETECTED Final   Mycoplasma pneumoniae NOT DETECTED NOT DETECTED Final    Comment: Performed at [facility], [place] [address], [place], [place] [address]  Novel Coronavirus, NAA (hospital order; send-out to ref lab)     Status: None   Collection Time: 04/26/18 12:39 AM  Result Value Ref Range Status   SARS-CoV-2, NAA NOT DETECTED NOT DETECTED Final    Comment: Performed at [place]  Final    Comment: Performed at [facility], [place] [address], [place], [place] [address]  Blood culture (routine x 2)     Status: None (Preliminary result)   Collection Time: 05/01/18  8:15 PM  Result Value Ref Range Status   Specimen Description BLOOD LEFT ANTECUBITAL  Final   Special Requests   Final    BOTTLES DRAWN AEROBIC AND ANAEROBIC Blood Culture results may not be optimal due to an inadequate volume of blood received in culture bottles   Culture   Final    NO GROWTH < 12 HOURS Performed at [facility] [address], [place], [place] [address]    Report Status PENDING  Incomplete  Blood culture (routine x 2)     Status: None (Preliminary result)   Collection Time:  05/01/18  8:32 PM  Result Value Ref Range Status  Specimen Description BLOOD RIGHT ANTECUBITAL  Final   Special Requests   Final    BOTTLES DRAWN AEROBIC AND ANAEROBIC Blood Culture results may not be optimal due to an inadequate volume of blood received in culture bottles   Culture   Final    NO GROWTH < 12 HOURS Performed at [facility] [address], [place], [place] [address]    Report Status PENDING  Incomplete         Radiology Studies: Ct Angio Chest Pe W Or Wo Contrast  Result Date: 05/01/2018 CLINICAL DATA:  Shortness of breath Suspicion for COVID-19 as well; Abd pain, acute, generalized. Increasing shortness of breath, generalized weakness and urinary retention for 2 days. Symptom progression after hospital discharge 2 days ago for urinary retention. EXAM: CT ANGIOGRAPHY CHEST CT ABDOMEN AND PELVIS WITH CONTRAST TECHNIQUE: Multidetector CT imaging of the chest was performed using the standard protocol during bolus administration of intravenous contrast. Multiplanar CT image reconstructions and MIPs were obtained to evaluate the vascular anatomy. Multidetector CT imaging of the abdomen and pelvis was performed using the standard protocol during bolus administration of intravenous contrast. CONTRAST:  27mL OMNIPAQUE IOHEXOL 350 MG/ML SOLN COMPARISON:  Chest radiograph earlier this day. Abdominal CT 5 days prior 04/26/2018 FINDINGS: CTA CHEST FINDINGS Cardiovascular: There are no filling defects within the pulmonary arteries to suggest pulmonary embolus. Mild aortic atherosclerosis without aneurysm. No aortic dissection. Heart is normal in size. There are coronary artery calcifications. Trace pericardial effusion. Mediastinum/Nodes: No enlarged mediastinal or hilar lymph nodes. Esophagus is decompressed. No visualized thyroid nodule. Lungs/Pleura: Emphysema. Blooming artifact projecting over the right lower lobe from external artifact from monitoring device. No acute airspace  disease, pulmonary edema, or pleural fluid. No suspicious nodule or mass. Trachea and mainstem bronchi are patent. Musculoskeletal: There are no acute or suspicious osseous abnormalities. Multiple Schmorl's nodes in the thoracic spine. Remote posterior right upper rib fractures. Review of the MIP images confirms the above findings. CT ABDOMEN and PELVIS FINDINGS Hepatobiliary: No focal liver abnormality is seen. No gallstones, gallbladder wall thickening, or biliary dilatation. Pancreas: No ductal dilatation or inflammation. Spleen: Normal in size without focal abnormality. Adrenals/Urinary Tract: No adrenal nodule. No hydronephrosis or perinephric edema. Homogeneous renal enhancement with symmetric excretion on delayed phase imaging. Small cysts within both kidneys. Urinary bladder is partially distended. Mild bladder wall thickening. Previous air in the urinary bladder has resolved. Stomach/Bowel: Small hiatal hernia. Stomach physiologically distended. No bowel wall thickening or inflammatory change. Colonic diverticulosis most prominent in the distal colon without diverticulitis. Enteric chain suture noted at the rectosigmoid junction. Normal appendix. Vascular/Lymphatic: Aortic atherosclerosis. No aneurysm. Circumaortic left renal vein. Portal vein and mesenteric vessels are patent. No enlarged lymph nodes in the abdomen or pelvis. Reproductive: Biopsy clips in the prostate gland. No significant change in small low-density fluid collection in the rectovesical space just posterior to the prostate gland. No peripheral enhancement. Other: Scattered edema in the anterior abdominal wall, typical of medication injection site. No free air in the abdomen. No ascites. Minimal fat in both inguinal canals. Musculoskeletal: Degenerative change in the spine and both hips. Vague subchondral sclerosis involving both femoral heads may be degenerative or Melek [place] vascular necrosis without subchondral collapse. Review of the MIP images  confirms the above findings. IMPRESSION: 1. No pulmonary embolus. 2. Moderate to advanced emphysema. No acute chest finding. No airspace disease to suggest pneumonia. 3. Mild bladder wall thickening, suspect this is chronic. Unchanged low-density fluid in the rectovesical  space over the past 5 days, likely post therapy changes. 4. Colonic diverticulosis without diverticulitis. 5. Additional chronic findings are stable as described. Aortic Atherosclerosis (ICD10-I70.0) and Emphysema (ICD10-J43.9). Electronically Signed   By: Keith Rake M.D.   On: 05/01/2018 23:50   Ct Abdomen Pelvis W Contrast  Result Date: 05/01/2018 CLINICAL DATA:  Shortness of breath Suspicion for COVID-19 as well; Abd pain, acute, generalized. Increasing shortness of breath, generalized weakness and urinary retention for 2 days. Symptom progression after hospital discharge 2 days ago for urinary retention. EXAM: CT ANGIOGRAPHY CHEST CT ABDOMEN AND PELVIS WITH CONTRAST TECHNIQUE: Multidetector CT imaging of the chest was performed using the standard protocol during bolus administration of intravenous contrast. Multiplanar CT image reconstructions and MIPs were obtained to evaluate the vascular anatomy. Multidetector CT imaging of the abdomen and pelvis was performed using the standard protocol during bolus administration of intravenous contrast. CONTRAST:  66mL OMNIPAQUE IOHEXOL 350 MG/ML SOLN COMPARISON:  Chest radiograph earlier this day. Abdominal CT 5 days prior 04/26/2018 FINDINGS: CTA CHEST FINDINGS Cardiovascular: There are no filling defects within the pulmonary arteries to suggest pulmonary embolus. Mild aortic atherosclerosis without aneurysm. No aortic dissection. Heart is normal in size. There are coronary artery calcifications. Trace pericardial effusion. Mediastinum/Nodes: No enlarged mediastinal or hilar lymph nodes. Esophagus is decompressed. No visualized thyroid nodule. Lungs/Pleura: Emphysema. Blooming artifact  projecting over the right lower lobe from external artifact from monitoring device. No acute airspace disease, pulmonary edema, or pleural fluid. No suspicious nodule or mass. Trachea and mainstem bronchi are patent. Musculoskeletal: There are no acute or suspicious osseous abnormalities. Multiple Schmorl's nodes in the thoracic spine. Remote posterior right upper rib fractures. Review of the MIP images confirms the above findings. CT ABDOMEN and PELVIS FINDINGS Hepatobiliary: No focal liver abnormality is seen. No gallstones, gallbladder wall thickening, or biliary dilatation. Pancreas: No ductal dilatation or inflammation. Spleen: Normal in size without focal abnormality. Adrenals/Urinary Tract: No adrenal nodule. No hydronephrosis or perinephric edema. Homogeneous renal enhancement with symmetric excretion on delayed phase imaging. Small cysts within both kidneys. Urinary bladder is partially distended. Mild bladder wall thickening. Previous air in the urinary bladder has resolved. Stomach/Bowel: Small hiatal hernia. Stomach physiologically distended. No bowel wall thickening or inflammatory change. Colonic diverticulosis most prominent in the distal colon without diverticulitis. Enteric chain suture noted at the rectosigmoid junction. Normal appendix. Vascular/Lymphatic: Aortic atherosclerosis. No aneurysm. Circumaortic left renal vein. Portal vein and mesenteric vessels are patent. No enlarged lymph nodes in the abdomen or pelvis. Reproductive: Biopsy clips in the prostate gland. No significant change in small low-density fluid collection in the rectovesical space just posterior to the prostate gland. No peripheral enhancement. Other: Scattered edema in the anterior abdominal wall, typical of medication injection site. No free air in the abdomen. No ascites. Minimal fat in both inguinal canals. Musculoskeletal: Degenerative change in the spine and both hips. Vague subchondral sclerosis involving both femoral  heads may be degenerative or [place] vascular necrosis without subchondral collapse. Review of the MIP images confirms the above findings. IMPRESSION: 1. No pulmonary embolus. 2. Moderate to advanced emphysema. No acute chest finding. No airspace disease to suggest pneumonia. 3. Mild bladder wall thickening, suspect this is chronic. Unchanged low-density fluid in the rectovesical space over the past 5 days, likely post therapy changes. 4. Colonic diverticulosis without diverticulitis. 5. Additional chronic findings are stable as described. Aortic Atherosclerosis (ICD10-I70.0) and Emphysema (ICD10-J43.9). Electronically Signed   By: Keith Rake M.D.   On: 05/01/2018 23:50  Dg Chest Portable 1 View  Result Date: 05/01/2018 CLINICAL DATA:  Shortness of breath. EXAM: PORTABLE CHEST 1 VIEW COMPARISON:  Radiographs of April 25, 2018. FINDINGS: The heart size and mediastinal contours are within normal limits. Both lungs are clear. Hyperexpansion of the lungs is noted. No pneumothorax or pleural effusion is noted. Old right rib fractures are noted. IMPRESSION: No acute cardiopulmonary abnormality seen. Findings consistent with chronic obstructive pulmonary disease. Electronically Signed   By: Marijo Conception, M.D.   On: 05/01/2018 20:42        Scheduled Meds:  ARIPiprazole  5 mg Oral Daily   azithromycin  250 mg Oral QHS   budesonide  0.5 mg Inhalation BID   enoxaparin (LOVENOX) injection  40 mg Subcutaneous Daily   escitalopram  20 mg Oral Daily   insulin aspart  0-9 Units Subcutaneous TID WC   ipratropium-albuterol  3 mL Inhalation Q6H   methylPREDNISolone (SOLU-MEDROL) injection  60 mg Intravenous Q8H   [START ON 05/03/2018] tamsulosin  0.8 mg Oral Daily   Continuous Infusions:   LOS: 0 days    Time spent: over 30 min    Fayrene Helper, MD [facility] Pager AMION  If 7PM-7AM, please contact night-coverage [email or web address] Password [facility] 05/02/2018, 11:36 AM

## 2018-05-02 NOTE — Progress Notes (Signed)
Bladder Scan shows > 200. In and Out Cath done. 675 ml urine out.

## 2018-05-02 NOTE — Evaluation (Signed)
Physical Therapy Evaluation Patient Details Name: Taylor [name] MRN: [id number] DOB: December 11, 1955 Today's Date: 05/02/2018   History of Present Illness  Pt is a 63 y.o. M with significant PMH of COPD, chronic hypoxic hypercarbic respiratory failure on 3L home O2, asthma, bipolar disorder, colon cancer, depression, prostate cancer, type 2 diabetes, who presents for evaluation of shortness of breath. Recently admitted 4/4-4/8 for acute on chronic hypercarbic/hypoxic respiratory failure for COPD exacercation and acute urinary retention. COVID 19 negative.   Clinical Impression  Pt admitted with above. On PT evaluation, pt presenting with decreased cardiovascular endurance and deconditioning. Ambulating 50 feet on 3L O2, SpO2 > 90% throughout, HR 102-128 bpm. DOE 3/4. Recommending HHPT to maximize functional independence and will continue to follow acutely to progress mobility as tolerated.    Follow Up Recommendations Home health PT;Supervision - Intermittent    Equipment Recommendations  None recommended by PT    Recommendations for Other Services       Precautions / Restrictions Precautions Precautions: Fall Precaution Comments: 4L O2 continuous currently and at baseline Restrictions Weight Bearing Restrictions: No      Mobility  Bed Mobility Overal bed mobility: Independent                Transfers Overall transfer level: Independent Equipment used: None                Ambulation/Gait Ambulation/Gait assistance: Counsellor (Feet): 50 Feet Assistive device: None(grabbing at rails) Gait Pattern/deviations: Scissoring;Decreased stride length;Step-through pattern;Narrow base of support Gait velocity: decr  Gait velocity interpretation: <1.8 ft/sec, indicate of risk for recurrent falls General Gait Details: Requiring min guard assist for stability, fatigues easily  Stairs            Wheelchair Mobility    Modified Rankin (Stroke  Patients Only)       Balance Overall balance assessment: Needs assistance         Standing balance support: No upper extremity supported;During functional activity;Single extremity supported Standing balance-Leahy Scale: Fair                               Pertinent Vitals/Pain Pain Assessment: No/denies pain    Home Living Family/patient expects to be discharged to:: Private residence Living Arrangements: Non-relatives/Friends Available Help at Discharge: Friend(s);Available PRN/intermittently Type of Home: House Home Access: Stairs to enter Entrance Stairs-Rails: Right Entrance Stairs-Number of Steps: 3 Home Layout: One level Home Equipment: Shower seat - built in Additional Comments:  only Dealer, pt has been on disabillty    Prior Function Level of Independence: Independent         Comments: limited in ambulation and ADLs due to dyspnea on exertion. Pt reports friend helps him get oxygen together, per previous note friend that lives with him also does grocery shopping.     Hand Dominance   Dominant Hand: Right    Extremity/Trunk Assessment   Upper Extremity Assessment Upper Extremity Assessment: Overall WFL for tasks assessed    Lower Extremity Assessment Lower Extremity Assessment: Overall WFL for tasks assessed    Cervical / Trunk Assessment Cervical / Trunk Assessment: Normal  Communication   Communication: No difficulties  Cognition Arousal/Alertness: Awake/alert Behavior During Therapy: WFL for tasks assessed/performed Overall Cognitive Status: Within Functional Limits for tasks assessed  General Comments      Exercises     Assessment/Plan    PT Assessment Patient needs continued PT services  PT Problem List Decreased activity tolerance;Decreased strength;Decreased mobility       PT Treatment Interventions Functional mobility training;Balance  training;Patient/family education;Gait training;Therapeutic activities;Therapeutic exercise;Stair training    PT Goals (Current goals can be found in the Care Plan section)  Acute Rehab PT Goals Patient Stated Goal: breathe better PT Goal Formulation: With patient Time For Goal Achievement: 05/16/18 Potential to Achieve Goals: Good    Frequency Min 3X/week   Barriers to discharge Decreased caregiver support      Co-evaluation               AM-PAC PT "6 Clicks" Mobility  Outcome Measure Help needed turning from your back to your side while in a flat bed without using bedrails?: None Help needed moving from lying on your back to sitting on the side of a flat bed without using bedrails?: None Help needed moving to and from a bed to a chair (including a wheelchair)?: A Little Help needed standing up from a chair using your arms (e.g., wheelchair or bedside chair)?: A Little Help needed to walk in hospital room?: A Little Help needed climbing 3-5 steps with a railing? : A Little 6 Click Score: 20    End of Session Equipment Utilized During Treatment: Oxygen Activity Tolerance: Patient limited by fatigue Patient left: in chair;with call bell/phone within reach Nurse Communication: Mobility status PT Visit Diagnosis: Other abnormalities of gait and mobility (R26.89)    Time: 2902-1115 PT Time Calculation (min) (ACUTE ONLY): 12 min   Charges:   PT Evaluation $PT Eval Moderate Complexity: 1 Mod         Ellamae Sia, PT, DPT Acute Rehabilitation Services Pager [phone] Office [phone]  Willy Eddy 05/02/2018, 3:47 PM

## 2018-05-02 NOTE — ED Notes (Signed)
ED TO INPATIENT HANDOFF REPORT  ED Nurse Name and Phone #:   Freida Busman  [phone]  S Name/Age/Gender Taylor [name] 63 y.o. male Room/Bed: 017C/017C  Code Status   Code Status: Full Code  Home/SNF/Other Home Patient oriented to: self, place, time and situation Is this baseline? Yes   Triage Complete: Triage complete  Chief Complaint SOB  Triage Note BIB EMS from home c/o incr'd SOB, generalized weakness, urinary retention for past 2 days. Admitted for 5 days last week for urinary retention, and d/c'd 2 days ago. States unable to void since arriving home. Decr'd food or fluid intake since then d/t fear of urinary retention.    Allergies Allergies  Allergen Reactions  . Codeine Nausea And Vomiting    Level of Care/Admitting Diagnosis ED Disposition    ED Disposition Condition Comment   Admit  Hospital Area: [place] [[id number]]  Level of Care: Progressive [102]  I expect the patient will be discharged within 24 hours: No (not a candidate for 5C-Observation unit)  Diagnosis: Acute on chronic respiratory failure with hypoxia and hypercapnia [facility]) [[id number]]  Admitting Physician: Shela Leff [[id number]]  Attending Physician: Shela Leff [[id number]]  Bed request comments: Low risk COVID-19  PT Class (Do Not Modify): Observation [104]  PT Acc Code (Do Not Modify): Observation [10022]       B Medical/Surgery History Past Medical History:  Diagnosis Date  . Acute on chronic respiratory failure ([place])   . Asthma   . Bipolar 1 disorder ([place]) 02/05/2012  . Colon cancer ([name]) 04/11/11   adenocarcinoma of colon, 7/19 nodes pos.FINISHED CHEMO/DR. SHERRILL  . COPD (chronic obstructive pulmonary disease) ([place])    SMOKER  . Depression   . Dyspnea   . Emphysema of lung ([place])   . Full dentures   . Hemorrhoids   . On home oxygen therapy    "3L; 24/7" (05/29/2017)  . Oxygen deficiency   . Pneumonia ~ 2016   "double pneumonia"  . Prostate cancer  ([place])    Gleason score = 7, supposed to have radiation therapy but he has not followed up (05/29/2017)  . Rib fractures    hx of   Past Surgical History:  Procedure Laterality Date  . COLON SURGERY  04/11/11   Sigmoid colectomy  . COLOSTOMY REVISION  04/11/2011   Procedure: COLON RESECTION SIGMOID;  Surgeon: [name], MD;  Location: [facility];  Service: General;  Laterality: N/A;  low anterior colon resection   . GOLD SEED IMPLANT N/A 02/24/2018   Procedure: GOLD SEED IMPLANT;  Surgeon: [name], MD;  Location: [facility];  Service: Urology;  Laterality: N/A;  . HERNIA REPAIR    . INGUINAL HERNIA REPAIR Right 05/01/2012   Procedure: HERNIA REPAIR INGUINAL ADULT;  Surgeon: [name], MD;  Location: [facility];  Service: General;  Laterality: Right;  . INSERTION OF MESH Right 05/01/2012   Procedure: INSERTION OF MESH;  Surgeon: [name], MD;  Location: [facility];  Service: General;  Laterality: Right;  . PORT-A-CATH REMOVAL Left 05/01/2012   Procedure: REMOVAL Infusion Port;  Surgeon: [name], MD;  Location: [facility];  Service: General;  Laterality: Left;  . PORTACATH PLACEMENT  05/02/2011   Procedure: INSERTION PORT-A-CATH;  Surgeon: [name], MD;  Location: [facility];  Service: General;  Laterality: N/A;  . PROSTATE BIOPSY    . SPACE OAR INSTILLATION N/A 02/24/2018   Procedure: SPACE OAR INSTILLATION;  Surgeon: [name], MD;  Location: [facility];  Service: Urology;  Laterality: N/A;     A IV Location/Drains/Wounds Patient Lines/Drains/Airways Status   Active Line/Drains/Airways    Name:   Placement date:   Placement time:   Site:   Days:   Peripheral IV 05/01/18 Left Forearm   05/01/18    2026    Forearm   1          Intake/Output Last 24 hours No intake or output data in the 24 hours ending 05/02/18 0014  Labs/Imaging Results for orders placed or performed during the hospital encounter of 05/01/18 (from the past 48 hour(s))  CBC with Differential     Status: Abnormal    Collection Time: 05/01/18  8:19 PM  Result Value Ref Range   WBC 8.7 4.0 - 10.5 K/uL   RBC 4.15 (L) 4.22 - 5.81 MIL/uL   Hemoglobin 12.6 (L) 13.0 - 17.0 g/dL   HCT 39.8 39.0 - 52.0 %   MCV 95.9 80.0 - 100.0 fL   MCH 30.4 26.0 - 34.0 pg   MCHC 31.7 30.0 - 36.0 g/dL   RDW 13.8 11.5 - 15.5 %   Platelets 286 150 - 400 K/uL   nRBC 0.0 0.0 - 0.2 %   Neutrophils Relative % 87 %   Neutro Abs 7.6 1.7 - 7.7 K/uL   Lymphocytes Relative 4 %   Lymphs Abs 0.3 (L) 0.7 - 4.0 K/uL   Monocytes Relative 7 %   Monocytes Absolute 0.6 0.1 - 1.0 K/uL   Eosinophils Relative 1 %   Eosinophils Absolute 0.1 0.0 - 0.5 K/uL   Basophils Relative 0 %   Basophils Absolute 0.0 0.0 - 0.1 K/uL   Immature Granulocytes 1 %   Abs Immature Granulocytes 0.11 (H) 0.00 - 0.07 K/uL    Comment: Performed at [facility], [address] [place], [place] [address]  Comprehensive metabolic panel     Status: Abnormal   Collection Time: 05/01/18  8:19 PM  Result Value Ref Range   Sodium 133 (L) 135 - 145 mmol/L   Potassium 4.5 3.5 - 5.1 mmol/L   Chloride 91 (L) 98 - 111 mmol/L   CO2 32 22 - 32 mmol/L   Glucose, Bld 133 (H) 70 - 99 mg/dL   BUN 20 8 - 23 mg/dL   Creatinine, Ser 0.71 0.61 - 1.24 mg/dL   Calcium 8.6 (L) 8.9 - 10.3 mg/dL   Total Protein 6.0 (L) 6.5 - 8.1 g/dL   Albumin 3.9 3.5 - 5.0 g/dL   AST 19 15 - 41 U/L   ALT 24 0 - 44 U/L   Alkaline Phosphatase 46 38 - 126 U/L   Total Bilirubin 0.6 0.3 - 1.2 mg/dL   GFR calc non Af Amer >60 >60 mL/min   GFR calc Af Amer >60 >60 mL/min   Anion gap 10 5 - 15    Comment: Performed at [facility], [place] [address] [place], [place] [address]  Brain natriuretic peptide     Status: None   Collection Time: 05/01/18  8:19 PM  Result Value Ref Range   B Natriuretic Peptide 45.6 0.0 - 100.0 pg/mL    Comment: Performed at [facility] [address] [place], [place] [address]  Troponin I - Now Then Q3H     Status: None   Collection Time: 05/01/18  8:19  PM  Result Value Ref Range   Troponin I <0.03 <0.03 ng/mL    Comment:  Performed at [facility], [place] [address] [place], [place] [address]  Lactic acid, plasma     Status: None   Collection Time: 05/01/18  8:20 PM  Result Value Ref Range   Lactic Acid, Venous 1.3 0.5 - 1.9 mmol/L    Comment: Performed at [facility] [address] [place], [place] [address]  POCT I-Stat EG7     Status: Abnormal   Collection Time: 05/01/18  8:44 PM  Result Value Ref Range   pH, Ven 7.269 7.250 - 7.430   pCO2, Ven 78.7 (HH) 44.0 - 60.0 mmHg   pO2, Ven 64.0 (H) 32.0 - 45.0 mmHg   Bicarbonate 36.0 (H) 20.0 - 28.0 mmol/L   TCO2 38 (H) 22 - 32 mmol/L   O2 Saturation 87.0 %   Acid-Base Excess 6.0 (H) 0.0 - 2.0 mmol/L   Sodium 134 (L) 135 - 145 mmol/L   Potassium 4.7 3.5 - 5.1 mmol/L   Calcium, Ion 1.22 1.15 - 1.40 mmol/L   HCT 38.0 (L) 39.0 - 52.0 %   Hemoglobin 12.9 (L) 13.0 - 17.0 g/dL   Patient temperature HIDE    Sample type VENOUS    Comment NOTIFIED PHYSICIAN   I-STAT 7, (LYTES, BLD GAS, ICA, H+H)     Status: Abnormal   Collection Time: 05/01/18 10:43 PM  Result Value Ref Range   pH, Arterial 7.303 (L) 7.350 - 7.450   pCO2 arterial 80.4 (HH) 32.0 - 48.0 mmHg   pO2, Arterial 212.0 (H) 83.0 - 108.0 mmHg   Bicarbonate 40.0 (H) 20.0 - 28.0 mmol/L   TCO2 42 (H) 22 - 32 mmol/L   O2 Saturation 100.0 %   Acid-Base Excess 11.0 (H) 0.0 - 2.0 mmol/L   Sodium 132 (L) 135 - 145 mmol/L   Potassium 4.8 3.5 - 5.1 mmol/L   Calcium, Ion 1.21 1.15 - 1.40 mmol/L   HCT 35.0 (L) 39.0 - 52.0 %   Hemoglobin 11.9 (L) 13.0 - 17.0 g/dL   Patient temperature 98.1 F    Collection site RADIAL, ALLEN'S TEST ACCEPTABLE    Drawn by Operator    Sample type ARTERIAL    Comment NOTIFIED PHYSICIAN    Ct Angio Chest Pe W Or Wo Contrast  Result Date: 05/01/2018 CLINICAL DATA:  Shortness of breath Suspicion for COVID-19 as well; Abd pain, acute, generalized. Increasing shortness of breath, generalized  weakness and urinary retention for 2 days. Symptom progression after hospital discharge 2 days ago for urinary retention. EXAM: CT ANGIOGRAPHY CHEST CT ABDOMEN AND PELVIS WITH CONTRAST TECHNIQUE: Multidetector CT imaging of the chest was performed using the standard protocol during bolus administration of intravenous contrast. Multiplanar CT image reconstructions and MIPs were obtained to evaluate the vascular anatomy. Multidetector CT imaging of the abdomen and pelvis was performed using the standard protocol during bolus administration of intravenous contrast. CONTRAST:  29mL OMNIPAQUE IOHEXOL 350 MG/ML SOLN COMPARISON:  Chest radiograph earlier this day. Abdominal CT 5 days prior 04/26/2018 FINDINGS: CTA CHEST FINDINGS Cardiovascular: There are no filling defects within the pulmonary arteries to suggest pulmonary embolus. Mild aortic atherosclerosis without aneurysm. No aortic dissection. Heart is normal in size. There are coronary artery calcifications. Trace pericardial effusion. Mediastinum/Nodes: No enlarged mediastinal or hilar lymph nodes. Esophagus is decompressed. No visualized thyroid nodule. Lungs/Pleura: Emphysema. Blooming artifact projecting over the right lower lobe from external artifact from monitoring device. No acute airspace disease, pulmonary edema, or pleural fluid. No suspicious nodule or mass. Trachea and mainstem  bronchi are patent. Musculoskeletal: There are no acute or suspicious osseous abnormalities. Multiple Schmorl's nodes in the thoracic spine. Remote posterior right upper rib fractures. Review of the MIP images confirms the above findings. CT ABDOMEN and PELVIS FINDINGS Hepatobiliary: No focal liver abnormality is seen. No gallstones, gallbladder wall thickening, or biliary dilatation. Pancreas: No ductal dilatation or inflammation. Spleen: Normal in size without focal abnormality. Adrenals/Urinary Tract: No adrenal nodule. No hydronephrosis or perinephric edema. Homogeneous renal  enhancement with symmetric excretion on delayed phase imaging. Small cysts within both kidneys. Urinary bladder is partially distended. Mild bladder wall thickening. Previous air in the urinary bladder has resolved. Stomach/Bowel: Small hiatal hernia. Stomach physiologically distended. No bowel wall thickening or inflammatory change. Colonic diverticulosis most prominent in the distal colon without diverticulitis. Enteric chain suture noted at the rectosigmoid junction. Normal appendix. Vascular/Lymphatic: Aortic atherosclerosis. No aneurysm. Circumaortic left renal vein. Portal vein and mesenteric vessels are patent. No enlarged lymph nodes in the abdomen or pelvis. Reproductive: Biopsy clips in the prostate gland. No significant change in small low-density fluid collection in the rectovesical space just posterior to the prostate gland. No peripheral enhancement. Other: Scattered edema in the anterior abdominal wall, typical of medication injection site. No free air in the abdomen. No ascites. Minimal fat in both inguinal canals. Musculoskeletal: Degenerative change in the spine and both hips. Vague subchondral sclerosis involving both femoral heads may be degenerative or a vascular necrosis without subchondral collapse. Review of the MIP images confirms the above findings. IMPRESSION: 1. No pulmonary embolus. 2. Moderate to advanced emphysema. No acute chest finding. No airspace disease to suggest pneumonia. 3. Mild bladder wall thickening, suspect this is chronic. Unchanged low-density fluid in the rectovesical space over the past 5 days, likely post therapy changes. 4. Colonic diverticulosis without diverticulitis. 5. Additional chronic findings are stable as described. Aortic Atherosclerosis (ICD10-I70.0) and Emphysema (ICD10-J43.9). Electronically Signed   By: Keith Rake M.D.   On: 05/01/2018 23:50   Ct Abdomen Pelvis W Contrast  Result Date: 05/01/2018 CLINICAL DATA:  Shortness of breath Suspicion  for COVID-19 as well; Abd pain, acute, generalized. Increasing shortness of breath, generalized weakness and urinary retention for 2 days. Symptom progression after hospital discharge 2 days ago for urinary retention. EXAM: CT ANGIOGRAPHY CHEST CT ABDOMEN AND PELVIS WITH CONTRAST TECHNIQUE: Multidetector CT imaging of the chest was performed using the standard protocol during bolus administration of intravenous contrast. Multiplanar CT image reconstructions and MIPs were obtained to evaluate the vascular anatomy. Multidetector CT imaging of the abdomen and pelvis was performed using the standard protocol during bolus administration of intravenous contrast. CONTRAST:  43mL OMNIPAQUE IOHEXOL 350 MG/ML SOLN COMPARISON:  Chest radiograph earlier this day. Abdominal CT 5 days prior 04/26/2018 FINDINGS: CTA CHEST FINDINGS Cardiovascular: There are no filling defects within the pulmonary arteries to suggest pulmonary embolus. Mild aortic atherosclerosis without aneurysm. No aortic dissection. Heart is normal in size. There are coronary artery calcifications. Trace pericardial effusion. Mediastinum/Nodes: No enlarged mediastinal or hilar lymph nodes. Esophagus is decompressed. No visualized thyroid nodule. Lungs/Pleura: Emphysema. Blooming artifact projecting over the right lower lobe from external artifact from monitoring device. No acute airspace disease, pulmonary edema, or pleural fluid. No suspicious nodule or mass. Trachea and mainstem bronchi are patent. Musculoskeletal: There are no acute or suspicious osseous abnormalities. Multiple Schmorl's nodes in the thoracic spine. Remote posterior right upper rib fractures. Review of the MIP images confirms the above findings. CT ABDOMEN and PELVIS FINDINGS Hepatobiliary: No focal liver  abnormality is seen. No gallstones, gallbladder wall thickening, or biliary dilatation. Pancreas: No ductal dilatation or inflammation. Spleen: Normal in size without focal abnormality.  Adrenals/Urinary Tract: No adrenal nodule. No hydronephrosis or perinephric edema. Homogeneous renal enhancement with symmetric excretion on delayed phase imaging. Small cysts within both kidneys. Urinary bladder is partially distended. Mild bladder wall thickening. Previous air in the urinary bladder has resolved. Stomach/Bowel: Small hiatal hernia. Stomach physiologically distended. No bowel wall thickening or inflammatory change. Colonic diverticulosis most prominent in the distal colon without diverticulitis. Enteric chain suture noted at the rectosigmoid junction. Normal appendix. Vascular/Lymphatic: Aortic atherosclerosis. No aneurysm. Circumaortic left renal vein. Portal vein and mesenteric vessels are patent. No enlarged lymph nodes in the abdomen or pelvis. Reproductive: Biopsy clips in the prostate gland. No significant change in small low-density fluid collection in the rectovesical space just posterior to the prostate gland. No peripheral enhancement. Other: Scattered edema in the anterior abdominal wall, typical of medication injection site. No free air in the abdomen. No ascites. Minimal fat in both inguinal canals. Musculoskeletal: Degenerative change in the spine and both hips. Vague subchondral sclerosis involving both femoral heads may be degenerative or a vascular necrosis without subchondral collapse. Review of the MIP images confirms the above findings. IMPRESSION: 1. No pulmonary embolus. 2. Moderate to advanced emphysema. No acute chest finding. No airspace disease to suggest pneumonia. 3. Mild bladder wall thickening, suspect this is chronic. Unchanged low-density fluid in the rectovesical space over the past 5 days, likely post therapy changes. 4. Colonic diverticulosis without diverticulitis. 5. Additional chronic findings are stable as described. Aortic Atherosclerosis (ICD10-I70.0) and Emphysema (ICD10-J43.9). Electronically Signed   By: Keith Rake M.D.   On: 05/01/2018 23:50   Dg  Chest Portable 1 View  Result Date: 05/01/2018 CLINICAL DATA:  Shortness of breath. EXAM: PORTABLE CHEST 1 VIEW COMPARISON:  Radiographs of April 25, 2018. FINDINGS: The heart size and mediastinal contours are within normal limits. Both lungs are clear. Hyperexpansion of the lungs is noted. No pneumothorax or pleural effusion is noted. Old right rib fractures are noted. IMPRESSION: No acute cardiopulmonary abnormality seen. Findings consistent with chronic obstructive pulmonary disease. Electronically Signed   By: Marijo Conception, M.D.   On: 05/01/2018 20:42    Pending Labs Unresulted Labs (From admission, onward)    Start     Ordered   05/02/18 0500  Blood gas, arterial  Tomorrow morning,   R     05/01/18 2317   05/02/18 0500  CBC  Tomorrow morning,   R     05/01/18 2317   05/02/18 7026  Basic metabolic panel  Tomorrow morning,   R     05/01/18 2317   05/01/18 2317  Gastrointestinal Panel by PCR , Stool  (Gastrointestinal Panel by PCR, Stool)  ONCE - STAT,   R     05/01/18 2317   05/01/18 2021  Blood culture (routine x 2)  BLOOD CULTURE X 2,   STAT     05/01/18 2020          Vitals/Pain Today's Vitals   05/01/18 2200 05/01/18 2215 05/01/18 2230 05/02/18 0005  BP: 102/76 107/74 102/73   Pulse: (!) 114 (!) 109 (!) 107   Resp: (!) 23 19 (!) 22   Temp:      TempSrc:      SpO2: 100% 100% 100%   Weight:      Height:      PainSc:    8  Isolation Precautions Enteric precautions (UV disinfection)  Medications Medications  albuterol (PROVENTIL HFA;VENTOLIN HFA) 108 (90 Base) MCG/ACT inhaler 8 puff (has no administration in time range)  Ipratropium-Albuterol (COMBIVENT) respimat 1 puff (has no administration in time range)  methylPREDNISolone sodium succinate (SOLU-MEDROL) 125 mg/2 mL injection 60 mg (has no administration in time range)  ARIPiprazole (ABILIFY) tablet 5 mg (has no administration in time range)  escitalopram (LEXAPRO) tablet 20 mg (has no administration in time  range)  tamsulosin (FLOMAX) capsule 0.4 mg (has no administration in time range)  enoxaparin (LOVENOX) injection 40 mg (has no administration in time range)  acetaminophen (TYLENOL) tablet 650 mg (has no administration in time range)    Or  acetaminophen (TYLENOL) suppository 650 mg (has no administration in time range)  0.9 %  sodium chloride infusion (has no administration in time range)  budesonide (PULMICORT) 180 MCG/ACT inhaler 2 puff (has no administration in time range)  insulin aspart (novoLOG) injection 0-9 Units (has no administration in time range)  loperamide (IMODIUM) capsule 2 mg (has no administration in time range)  azithromycin (ZITHROMAX) tablet 500 mg (has no administration in time range)  azithromycin (ZITHROMAX) tablet 250 mg (has no administration in time range)  albuterol (PROVENTIL HFA;VENTOLIN HFA) 108 (90 Base) MCG/ACT inhaler 4 puff (4 puffs Inhalation Given 05/01/18 2032)  methylPREDNISolone sodium succinate (SOLU-MEDROL) 125 mg/2 mL injection 125 mg (125 mg Intravenous Given 05/01/18 2245)  iohexol (OMNIPAQUE) 350 MG/ML injection 85 mL (85 mLs Intravenous Contrast Given 05/01/18 2304)    Mobility walks Moderate fall risk   Focused Assessments Pulmonary Assessment Handoff:  Lung sounds:   O2 Device: Nasal Cannula O2 Flow Rate (L/min): 3 L/min      R Recommendations: See Admitting Provider Note  Report given to:   Additional Notes:   Per Marlowe Sax, MD pt NOT to be tested or placed on precautions for COVID risk.

## 2018-05-02 NOTE — Progress Notes (Signed)
Pt verbalized severe discomfort and inability to urinate. Pt bladder scanned and amount noted was greater than 752ml. Per provide order, urethral catheter placed in patient.  Urine returned (see flowsheets for amount) and patient verbalized relief.

## 2018-05-02 NOTE — Plan of Care (Signed)

## 2018-05-03 LAB — COMPREHENSIVE METABOLIC PANEL WITH GFR
ALT: 18 U/L (ref 0–44)
AST: 14 U/L — ABNORMAL LOW (ref 15–41)
Albumin: 3.3 g/dL — ABNORMAL LOW (ref 3.5–5.0)
Alkaline Phosphatase: 39 U/L (ref 38–126)
Anion gap: 7 (ref 5–15)
BUN: 13 mg/dL (ref 8–23)
CO2: 35 mmol/L — ABNORMAL HIGH (ref 22–32)
Calcium: 8.3 mg/dL — ABNORMAL LOW (ref 8.9–10.3)
Chloride: 92 mmol/L — ABNORMAL LOW (ref 98–111)
Creatinine, Ser: 0.7 mg/dL (ref 0.61–1.24)
GFR calc Af Amer: >60 mL/min
GFR calc non Af Amer: >60 mL/min
Glucose, Bld: 166 mg/dL — ABNORMAL HIGH (ref 70–99)
Potassium: 4.6 mmol/L (ref 3.5–5.1)
Sodium: 134 mmol/L — ABNORMAL LOW (ref 135–145)
Total Bilirubin: 0.5 mg/dL (ref 0.3–1.2)
Total Protein: 5 g/dL — ABNORMAL LOW (ref 6.5–8.1)

## 2018-05-03 LAB — CBC
HCT: 30.8 % — ABNORMAL LOW (ref 39.0–52.0)
Hemoglobin: 10.4 g/dL — ABNORMAL LOW (ref 13.0–17.0)
MCH: 31.7 pg (ref 26.0–34.0)
MCHC: 33.8 g/dL (ref 30.0–36.0)
MCV: 93.9 fL (ref 80.0–100.0)
Platelets: 213 10*3/uL (ref 150–400)
RBC: 3.28 MIL/uL — ABNORMAL LOW (ref 4.22–5.81)
RDW: 13.5 % (ref 11.5–15.5)
WBC: 7.4 10*3/uL (ref 4.0–10.5)
nRBC: 0 % (ref 0.0–0.2)

## 2018-05-03 LAB — GLUCOSE, CAPILLARY
Glucose-Capillary: 91 mg/dL (ref 70–99)
Glucose-Capillary: 162 mg/dL — ABNORMAL HIGH (ref 70–99)

## 2018-05-03 LAB — MAGNESIUM: Magnesium: 2.2 mg/dL (ref 1.7–2.4)

## 2018-05-03 MED ORDER — PREDNISONE 20 MG PO TABS: ORAL_TABLET | ORAL | 0 refills | Status: AC

## 2018-05-03 MED ORDER — TAMSULOSIN HCL 0.4 MG PO CAPS: 0.8000 mg | ORAL_CAPSULE | Freq: Every day | ORAL | 0 refills | Status: AC

## 2018-05-03 MED ORDER — IPRATROPIUM-ALBUTEROL 0.5-2.5 (3) MG/3ML IN SOLN
3.0000 mL | Freq: Three times a day (TID) | RESPIRATORY_TRACT | Status: DC
  Filled 2018-05-03: qty 3

## 2018-05-03 MED ORDER — AZITHROMYCIN 250 MG PO TABS: 250.0000 mg | ORAL_TABLET | Freq: Every day | ORAL | 0 refills | Status: AC

## 2018-05-03 MED ORDER — LACTATED RINGERS IV BOLUS
1000.0000 mL | Freq: Once | INTRAVENOUS | Status: AC
  Administered 2018-05-03: 11:00:00 1000 mL via INTRAVENOUS

## 2018-05-03 NOTE — TOC Transition Note (Signed)
Transition of Care [facility]) - CM/SW Discharge Note   Patient Details  Name: Taylor [name] MRN: [id number] Date of Birth: 11-Feb-1955  Transition of Care [facility]) CM/SW Contact:  Claudie Leach, RN Phone Number: 05/03/2018, 12:46 PM   Final next level of care: Home/Self Care Barriers to Discharge: Transportation, Equipment Delay, Barriers Resolved    Discharge Plan and Services     Post Acute Care Choice: NA          DME Arranged: N/A   HH Arranged: Refused HH   Patient refused Home health services, stating "we don't have the room for people to come visit or for exercise".  Pt states he lives with friend that assists him.  However, no one is available to bring a portable oxygen tank to the hospital. Patient requests [facility] transport as he is not sure he can get into house from a car and no one is available to help him.  Pt refuses offers of DME, home health or home assessment, as he did last time he was admitted.   MN form on unit.  RN will call [facility] when patient ready.  Readmission Risk Interventions Readmission Risk Prevention Plan 05/03/2018 04/27/2018 09/19/2017  Transportation Screening - Complete Complete  Medication Review Press photographer) - Complete -  PCP or Specialist appointment within 3-5 days of discharge - Complete -  [place] or Home Care Consult Patient refused Complete Patient refused  SW Recovery Care/Counseling Consult - Not Complete -  SW Consult Not Complete Comments - NA -  Palliative Care Screening - Not Applicable -  [place] - Not Applicable -  Some recent data might be hidden

## 2018-05-03 NOTE — Progress Notes (Signed)
Physical Therapy Treatment Patient Details Name: Taylor [name] MRN: [id number] DOB: 03-16-1955 Today's Date: 05/03/2018    History of Present Illness Pt is a 63 y.o. M with significant PMH of COPD, chronic hypoxic hypercarbic respiratory failure on 3L home O2, asthma, bipolar disorder, colon cancer, depression, prostate cancer, type 2 diabetes, who presents for evaluation of shortness of breath. Recently admitted 4/4-4/8 for acute on chronic hypercarbic/hypoxic respiratory failure for COPD exacercation and acute urinary retention. COVID 19 negative.     PT Comments    Ambulating 50' today on 4L, WNL SpO2. Pt fatigues quickly, will benefit from HHPT for further reconditioning.    Follow Up Recommendations  Home health PT;Supervision - Intermittent     Equipment Recommendations  None recommended by PT    Recommendations for Other Services       Precautions / Restrictions Precautions Precautions: Fall Precaution Comments: 4L O2 continuous currently and at baseline Restrictions Weight Bearing Restrictions: No    Mobility  Bed Mobility Overal bed mobility: Independent                Transfers Overall transfer level: Independent                  Ambulation/Gait Ambulation/Gait assistance: Min guard Gait Distance (Feet): 50 Feet Assistive device: None Gait Pattern/deviations: Decreased stride length;Step-to pattern Gait velocity: decrease   General Gait Details: ambulating in room on 4L, WNL SpO2   Stairs             Wheelchair Mobility    Modified Rankin (Stroke Patients Only)       Balance Overall balance assessment: Needs assistance         Standing balance support: No upper extremity supported;During functional activity;Single extremity supported Standing balance-Leahy Scale: Fair                              Cognition Arousal/Alertness: Awake/alert Behavior During Therapy: WFL for tasks  assessed/performed Overall Cognitive Status: Within Functional Limits for tasks assessed                                        Exercises      General Comments        Pertinent Vitals/Pain Pain Assessment: No/denies pain    Home Living                      Prior Function            PT Goals (current goals can now be found in the care plan section) Acute Rehab PT Goals Patient Stated Goal: breathe better PT Goal Formulation: With patient Time For Goal Achievement: 05/16/18    Frequency    Min 3X/week      PT Plan Current plan remains appropriate    Co-evaluation              AM-PAC PT "6 Clicks" Mobility   Outcome Measure  Help needed turning from your back to your side while in a flat bed without using bedrails?: None Help needed moving from lying on your back to sitting on the side of a flat bed without using bedrails?: None Help needed moving to and from a bed to a chair (including a wheelchair)?: A Little Help needed standing up from a chair using your arms (e.g., wheelchair  or bedside chair)?: A Little Help needed to walk in hospital room?: A Little Help needed climbing 3-5 steps with a railing? : A Little 6 Click Score: 20    End of Session Equipment Utilized During Treatment: Oxygen Activity Tolerance: Patient limited by fatigue Patient left: in chair;with call bell/phone within reach Nurse Communication: Mobility status PT Visit Diagnosis: Other abnormalities of gait and mobility (R26.89)     Time: 1240-1300 PT Time Calculation (min) (ACUTE ONLY): 20 min  Charges:  $Gait Training: 8-22 mins                     Reinaldo Berber, PT, DPT Acute Rehabilitation Services Pager: [phone] Office: [phone]     Reinaldo Berber 05/03/2018, 1:24 PM

## 2018-05-03 NOTE — Discharge Summary (Signed)
Physician Discharge Summary  MARCELLES CLINARD TTS:[id number] DOB: 1955-09-19 DOA: 05/01/2018  PCP: Clent Demark, PA-C  Admit date: 05/01/2018 Discharge date: 05/03/2018  Time spent: 40 minutes  Recommendations for Outpatient Follow-up:  1. Follow up CBC/CMP 2. Follow up with Dr. Tresa Moore outpatient for urinary retention (discussed with on call Urology and msg sent to [facility]) 3. Consider outpatient sleep study  Discharge Diagnoses:  Principal Problem:   Acute on chronic respiratory failure with hypoxia and hypercapnia (HCC) Active Problems:   Bipolar 1 disorder (HCC)   COPD with acute exacerbation ([place])   Oliguria   Viral gastroenteritis   Discharge Condition: stable  Diet recommendation: heart healthy  Filed Weights   05/01/18 2022 05/02/18 0052 05/03/18 0439  Weight: 75.8 kg 71.7 kg 73.6 kg    History of present illness:  Abdiel Blackerby Hendersonis Michie Molnar 63 y.o.malewith medical history significant ofCOPD and chronic hypoxic hypercarbic respiratory failure on 3 L home oxygen, asthma, bipolar disorder, colon cancer, depression, prostate cancer on radiation therapy,type 2 diabetes, hyperlipidemiapresenting to the hospital for evaluation of shortness of breath.Patient states since he left the hospital 2 days ago he has been feeling very short of breath. Denies any chest pain,wheezing,or cough. Denies any fevers or chills. Reports compliance with home COPD inhalers. States he is also having Eduardo Wurth small amount of nonbloody diarrhea and some slight abdominal discomfort when he tries to drink water.No abdominal pain otherwise. He is feeling nauseous but has not vomited. States he is afraid to eat or drink anything. He has not urinated much.  Patient was recently admitted from April 4 to April 8 for acute on chronic hypercarbic/hypoxic respiratory failure secondary to COPD exacerbation and acute urinary retention. Respiratory viral panel in COVID-19  negative. His acute urinary retention resolved and Foley catheter was removed on April 8.  He was admitted for Trew Sunde COPD exacerbation as well as acute urinary retention.  His SOB improved with steroids, nebulizers, and supportive care.  He required catheterization for recurrent acute urinary retention.  He did not have recurrent diarrhea while admitted.    Hospital Course:  Acute on chronic hypoxic hypercarbic respiratory failure secondary to acute exacerbation of chronic obstructive pulmonary disease - persistent wheezing today, but on baseline O2 (3 L [place]) - ABG with persistent respiratory acidosis -> repeat VBG with respiratory acidosis, but normal pH -CTAnegative for PE. No airspace disease to suggest pneumonia. Tested negative for COVID-19 during recent hospitalization. -Resume home nebs. -D/c on azithromycin and steroid taper   Acute Urinary Retention:  S/p I&O cath x1 with ~600 cc out Required placement of foley cather - will need to follow up with urology at discharge Increase flomax to 0.8 mg  Diarrhea: - resolved   Sinus Tachycardia: improved after IVF   Chronic anemia - Hb Stable, follow  Mild chronic hyponatremia -continue to monitor  Mild Hyperkalemia - repeat K this afternoon  Bipolar disorder -Continue Abilify, Lexapro  Type 2 diabetes -A1c 7.7 on metformin at home. Sliding scale insulin and CBG checks.  BPH -Continue Flomax (increase as noted above)  Procedures: none  Consultations:  none  Discharge Exam: Vitals:   05/03/18 0807 05/03/18 0824  BP:  105/73  Pulse:  (!) 110  Resp:  18  Temp:  98.8 F (37.1 C)  SpO2: 99% 96%   SOB better. Feels better after foley catheter placed.  General: No acute distress. Cardiovascular: Heart sounds show Haleem Hanner regular rate, and rhythm.  Lungs: Clear to auscultation bilaterally  Abdomen: Soft, nontender, nondistended Neurological: Alert and oriented 3. Moves all extremities 4. Cranial nerves II  through XII grossly intact. Skin: Warm and dry. No rashes or lesions. Extremities: no LEE  Discharge Instructions   Discharge Instructions    Call MD for:  difficulty breathing, headache or visual disturbances   Complete by:  As directed    Call MD for:  extreme fatigue   Complete by:  As directed    Call MD for:  persistant dizziness or light-headedness   Complete by:  As directed    Call MD for:  persistant nausea and vomiting   Complete by:  As directed    Call MD for:  redness, tenderness, or signs of infection (pain, swelling, redness, odor or green/yellow discharge around incision site)   Complete by:  As directed    Call MD for:  severe uncontrolled pain   Complete by:  As directed    Call MD for:  temperature >100.4   Complete by:  As directed    Diet - low sodium heart healthy   Complete by:  As directed    Discharge instructions   Complete by:  As directed    You were seen for Mylen Mangan COPD exacerbation.    You've improved with steroids and breathing treatments here.  We'll discharge you with azithromycin (antibiotic).  You had urinary retention here.  We've placed Jasiel Belisle foley catheter.  You'll need to follow up with Dr. Tresa Moore as an outpatient.  We've placed you on flomax 0.8 mg daily.    Return for new, recurrent, or worsening issues.  Please ask your PCP to request records from this hospitalization so they know what was done and what the next steps will be.   Increase activity slowly   Complete by:  As directed      Allergies as of 05/03/2018      Reactions   Codeine Nausea And Vomiting      Medication List    TAKE these medications   Accu-Chek Aviva device Use as instructed   accu-chek soft touch lancets 1 each by Other route 2 (two) times daily. Use as instructed   Proventil HFA 108 (90 Base) MCG/ACT inhaler Generic drug:  albuterol Inhale 2 puffs into the lungs every 6 (six) hours as needed for wheezing or shortness of breath.   albuterol (2.5 MG/3ML)  0.083% nebulizer solution Commonly known as:  PROVENTIL Take 3 mLs (2.5 mg total) by nebulization every 4 (four) hours as needed for wheezing or shortness of breath (if you can't catch your breath).   ARIPiprazole 5 MG tablet Commonly known as:  ABILIFY Take 1 tablet (5 mg total) by mouth daily.   azithromycin 250 MG tablet Commonly known as:  ZITHROMAX Take 1 tablet (250 mg total) by mouth daily for 3 days.   escitalopram 20 MG tablet Commonly known as:  LEXAPRO Take 1 tablet (20 mg total) by mouth daily.   glucose blood test strip Commonly known as:  Accu-Chek Aviva 1 each by Other route as needed for other. Use as instructed   loperamide 2 MG capsule Commonly known as:  IMODIUM Take 2 every 6 hours if continued diarrhea What changed:    how much to take  how to take this  when to take this  additional instructions   lovastatin 20 MG tablet Commonly known as:  MEVACOR Take 1 tablet (20 mg total) by mouth at bedtime.   metFORMIN 1000 MG tablet Commonly known as:  GLUCOPHAGE  Take 1 tablet (1,000 mg total) by mouth 2 (two) times daily with Jacquees Gongora meal.   metoprolol tartrate 25 MG tablet Commonly known as:  LOPRESSOR Take 0.5 tablets (12.5 mg total) by mouth 2 (two) times daily for 60 doses.   mometasone-formoterol 100-5 MCG/ACT Aero Commonly known as:  DULERA Inhale 2 puffs into the lungs 2 (two) times daily.   OXYGEN Inhale 3 L into the lungs continuous.   predniSONE 20 MG tablet Commonly known as:  DELTASONE Take 2 tablets (40 mg total) by mouth daily with breakfast for 3 days, THEN 1.5 tablets (30 mg total) daily with breakfast for 3 days, THEN 1 tablet (20 mg total) daily with breakfast for 3 days, THEN 0.5 tablets (10 mg total) daily with breakfast for 3 days. Start taking on:  May 04, 2018 What changed:    medication strength  See the new instructions.   tamsulosin 0.4 MG Caps capsule Commonly known as:  FLOMAX Take 2 capsules (0.8 mg total) by mouth  daily for 30 days. Start taking on:  May 04, 2018 What changed:  how much to take   tiotropium 18 MCG inhalation capsule Commonly known as:  SPIRIVA Place 1 capsule (18 mcg total) into inhaler and inhale daily.      Allergies  Allergen Reactions  . Codeine Nausea And Vomiting      The results of significant diagnostics from this hospitalization (including imaging, microbiology, ancillary and laboratory) are listed below for reference.    Significant Diagnostic Studies: Ct Abdomen Pelvis Wo Contrast  Result Date: 04/26/2018 CLINICAL DATA:  63 year old male with worsening abdominal pain and history of prostate carcinoma EXAM: CT ABDOMEN AND PELVIS WITHOUT CONTRAST TECHNIQUE: Multidetector CT imaging of the abdomen and pelvis was performed following the standard protocol without IV contrast. COMPARISON:  01/17/2018 FINDINGS: Lower chest: Emphysema without acute finding of the lower chest. Hepatobiliary: Unremarkable appearance of the liver. Unremarkable gallbladder Pancreas: Unremarkable pancreas Spleen: Unremarkable spleen Adrenals/Urinary Tract: Unremarkable adrenal glands. Bilateral kidneys demonstrate no hydronephrosis. No nephrolithiasis. Unremarkable course the bilateral ureters. Cystic structure posterior cortex of the right kidney, unchanged. Other kidney cysts are not as well characterized on the current CT. Urinary bladder mostly decompressed. Balloon retention catheter in place. Gas at the anti dependent aspect of the urinary bladder. Stomach/Bowel: Hiatal hernia. Otherwise unremarkable stomach. Unremarkable small bowel with no abnormal distention. No transition point. Normal appendix. Mild stool burden. Colonic diverticular disease without associated inflammation. Vascular/Lymphatic: Calcifications of the aorta. Greatest diameter of the infrarenal abdominal aorta measures 2.4 cm. Calcifications bilateral iliac arteries. No adenopathy. Reproductive: Lentiform low-density fluid  collection in the rectovesical space. Fluid is inseparable from rectum and the posterior aspect of the prostate. Greatest diameter on the axial images measures 1.9 cm. Greatest diameter on the parasagittal images measures 4.6 cm. Other: Small foci of gas within the anterior abdominal wall with mild infiltration, likely injection granulomas. Musculoskeletal: No displaced fracture. Degenerative changes of the spine. The changes of the hips. IMPRESSION: There is Angee Gupton low-density lentiform fluid collection in the rectovesical space, inseparable from the rectum and prostate. 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 Gas within the urinary bladder is presumably related to manipulation of the catheter in place. If there is concern for gas-forming organism, recommend correlation with urinalysis. Diverticular disease without evidence of acute diverticulitis. Additional ancillary findings as above. Electronically Signed   By:  Corrie Mckusick D.O.   On: 04/26/2018 16:05   Dg Chest 2 View  Result Date: 04/03/2018 CLINICAL DATA:  63 year old male with increase shortness of breath. Chest pain. COPD on home oxygen. EXAM: CHEST - 2 VIEW COMPARISON:  03/26/2018 and earlier. FINDINGS: Semi upright AP and lateral views of the chest. Stable large lung volumes. Centrilobular emphysema demonstrated by CT last year. No pneumothorax, pulmonary edema, pleural effusion or confluent pulmonary opacity. Visualized tracheal air column is within normal limits. Normal cardiac size and mediastinal contours. No acute osseous abnormality identified. Negative visible bowel gas pattern. IMPRESSION: Emphysema (ICD10-J43.9). No acute cardiopulmonary abnormality. Electronically Signed   By: Genevie Ann M.D.   On: 04/03/2018 17:42   Ct Angio Chest Pe W Or Wo Contrast  Result Date: 05/01/2018 CLINICAL DATA:   Shortness of breath Suspicion for COVID-19 as well; Abd pain, acute, generalized. Increasing shortness of breath, generalized weakness and urinary retention for 2 days. Symptom progression after hospital discharge 2 days ago for urinary retention. EXAM: CT ANGIOGRAPHY CHEST CT ABDOMEN AND PELVIS WITH CONTRAST TECHNIQUE: Multidetector CT imaging of the chest was performed using the standard protocol during bolus administration of intravenous contrast. Multiplanar CT image reconstructions and MIPs were obtained to evaluate the vascular anatomy. Multidetector CT imaging of the abdomen and pelvis was performed using the standard protocol during bolus administration of intravenous contrast. CONTRAST:  61mL OMNIPAQUE IOHEXOL 350 MG/ML SOLN COMPARISON:  Chest radiograph earlier this day. Abdominal CT 5 days prior 04/26/2018 FINDINGS: CTA CHEST FINDINGS Cardiovascular: There are no filling defects within the pulmonary arteries to suggest pulmonary embolus. Mild aortic atherosclerosis without aneurysm. No aortic dissection. Heart is normal in size. There are coronary artery calcifications. Trace pericardial effusion. Mediastinum/Nodes: No enlarged mediastinal or hilar lymph nodes. Esophagus is decompressed. No visualized thyroid nodule. Lungs/Pleura: Emphysema. Blooming artifact projecting over the right lower lobe from external artifact from monitoring device. No acute airspace disease, pulmonary edema, or pleural fluid. No suspicious nodule or mass. Trachea and mainstem bronchi are patent. Musculoskeletal: There are no acute or suspicious osseous abnormalities. Multiple Schmorl's nodes in the thoracic spine. Remote posterior right upper rib fractures. Review of the MIP images confirms the above findings. CT ABDOMEN and PELVIS FINDINGS Hepatobiliary: No focal liver abnormality is seen. No gallstones, gallbladder wall thickening, or biliary dilatation. Pancreas: No ductal dilatation or inflammation. Spleen: Normal in size  without focal abnormality. Adrenals/Urinary Tract: No adrenal nodule. No hydronephrosis or perinephric edema. Homogeneous renal enhancement with symmetric excretion on delayed phase imaging. Small cysts within both kidneys. Urinary bladder is partially distended. Mild bladder wall thickening. Previous air in the urinary bladder has resolved. Stomach/Bowel: Small hiatal hernia. Stomach physiologically distended. No bowel wall thickening or inflammatory change. Colonic diverticulosis most prominent in the distal colon without diverticulitis. Enteric chain suture noted at the rectosigmoid junction. Normal appendix. Vascular/Lymphatic: Aortic atherosclerosis. No aneurysm. Circumaortic left renal vein. Portal vein and mesenteric vessels are patent. No enlarged lymph nodes in the abdomen or pelvis. Reproductive: Biopsy clips in the prostate gland. No significant change in small low-density fluid collection in the rectovesical space just posterior to the prostate gland. No peripheral enhancement. Other: Scattered edema in the anterior abdominal wall, typical of medication injection site. No free air in the abdomen. No ascites. Minimal fat in both inguinal canals. Musculoskeletal: Degenerative change in the spine and both hips. Vague subchondral sclerosis involving both femoral heads may be degenerative or Thalia Turkington vascular necrosis without subchondral collapse. Review of the MIP images confirms  the above findings. IMPRESSION: 1. No pulmonary embolus. 2. Moderate to advanced emphysema. No acute chest finding. No airspace disease to suggest pneumonia. 3. Mild bladder wall thickening, suspect this is chronic. Unchanged low-density fluid in the rectovesical space over the past 5 days, likely post therapy changes. 4. Colonic diverticulosis without diverticulitis. 5. Additional chronic findings are stable as described. Aortic Atherosclerosis (ICD10-I70.0) and Emphysema (ICD10-J43.9). Electronically Signed   By: Keith Rake M.D.    On: 05/01/2018 23:50   Ct Abdomen Pelvis W Contrast  Result Date: 05/01/2018 CLINICAL DATA:  Shortness of breath Suspicion for COVID-19 as well; Abd pain, acute, generalized. Increasing shortness of breath, generalized weakness and urinary retention for 2 days. Symptom progression after hospital discharge 2 days ago for urinary retention. EXAM: CT ANGIOGRAPHY CHEST CT ABDOMEN AND PELVIS WITH CONTRAST TECHNIQUE: Multidetector CT imaging of the chest was performed using the standard protocol during bolus administration of intravenous contrast. Multiplanar CT image reconstructions and MIPs were obtained to evaluate the vascular anatomy. Multidetector CT imaging of the abdomen and pelvis was performed using the standard protocol during bolus administration of intravenous contrast. CONTRAST:  76mL OMNIPAQUE IOHEXOL 350 MG/ML SOLN COMPARISON:  Chest radiograph earlier this day. Abdominal CT 5 days prior 04/26/2018 FINDINGS: CTA CHEST FINDINGS Cardiovascular: There are no filling defects within the pulmonary arteries to suggest pulmonary embolus. Mild aortic atherosclerosis without aneurysm. No aortic dissection. Heart is normal in size. There are coronary artery calcifications. Trace pericardial effusion. Mediastinum/Nodes: No enlarged mediastinal or hilar lymph nodes. Esophagus is decompressed. No visualized thyroid nodule. Lungs/Pleura: Emphysema. Blooming artifact projecting over the right lower lobe from external artifact from monitoring device. No acute airspace disease, pulmonary edema, or pleural fluid. No suspicious nodule or mass. Trachea and mainstem bronchi are patent. Musculoskeletal: There are no acute or suspicious osseous abnormalities. Multiple Schmorl's nodes in the thoracic spine. Remote posterior right upper rib fractures. Review of the MIP images confirms the above findings. CT ABDOMEN and PELVIS FINDINGS Hepatobiliary: No focal liver abnormality is seen. No gallstones, gallbladder wall thickening,  or biliary dilatation. Pancreas: No ductal dilatation or inflammation. Spleen: Normal in size without focal abnormality. Adrenals/Urinary Tract: No adrenal nodule. No hydronephrosis or perinephric edema. Homogeneous renal enhancement with symmetric excretion on delayed phase imaging. Small cysts within both kidneys. Urinary bladder is partially distended. Mild bladder wall thickening. Previous air in the urinary bladder has resolved. Stomach/Bowel: Small hiatal hernia. Stomach physiologically distended. No bowel wall thickening or inflammatory change. Colonic diverticulosis most prominent in the distal colon without diverticulitis. Enteric chain suture noted at the rectosigmoid junction. Normal appendix. Vascular/Lymphatic: Aortic atherosclerosis. No aneurysm. Circumaortic left renal vein. Portal vein and mesenteric vessels are patent. No enlarged lymph nodes in the abdomen or pelvis. Reproductive: Biopsy clips in the prostate gland. No significant change in small low-density fluid collection in the rectovesical space just posterior to the prostate gland. No peripheral enhancement. Other: Scattered edema in the anterior abdominal wall, typical of medication injection site. No free air in the abdomen. No ascites. Minimal fat in both inguinal canals. Musculoskeletal: Degenerative change in the spine and both hips. Vague subchondral sclerosis involving both femoral heads may be degenerative or Junie Engram vascular necrosis without subchondral collapse. Review of the MIP images confirms the above findings. IMPRESSION: 1. No pulmonary embolus. 2. Moderate to advanced emphysema. No acute chest finding. No airspace disease to suggest pneumonia. 3. Mild bladder wall thickening, suspect this is chronic. Unchanged low-density fluid in the rectovesical space over the past 5  days, likely post therapy changes. 4. Colonic diverticulosis without diverticulitis. 5. Additional chronic findings are stable as described. Aortic Atherosclerosis  (ICD10-I70.0) and Emphysema (ICD10-J43.9). Electronically Signed   By: Keith Rake M.D.   On: 05/01/2018 23:50   Dg Chest Portable 1 View  Result Date: 05/01/2018 CLINICAL DATA:  Shortness of breath. EXAM: PORTABLE CHEST 1 VIEW COMPARISON:  Radiographs of April 25, 2018. FINDINGS: The heart size and mediastinal contours are within normal limits. Both lungs are clear. Hyperexpansion of the lungs is noted. No pneumothorax or pleural effusion is noted. Old right rib fractures are noted. IMPRESSION: No acute cardiopulmonary abnormality seen. Findings consistent with chronic obstructive pulmonary disease. Electronically Signed   By: Marijo Conception, M.D.   On: 05/01/2018 20:42   Dg Chest Port 1 View  Result Date: 04/25/2018 CLINICAL DATA:  Shortness of breath. EXAM: PORTABLE CHEST 1 VIEW COMPARISON:  April 23, 2018 FINDINGS: Healed right rib fractures are noted. The heart, hila, and mediastinum are normal. Hyperinflation of the lungs is identified. No nodules, masses, or focal infiltrates. IMPRESSION: COPD.  No acute abnormalities. Electronically Signed   By: Dorise Bullion III M.D   On: 04/25/2018 21:11   Dg Chest Port 1 View  Result Date: 04/23/2018 CLINICAL DATA:  Cough, wheezing and shortness of breath. Recently discharged from the hospital for exacerbation of COPD. EXAM: PORTABLE CHEST 1 VIEW COMPARISON:  04/14/2018 FINDINGS: Cardiac silhouette is normal in size. No mediastinal or hilar masses. There is no evidence of adenopathy. Lungs are hyperexpanded, but clear. No pleural effusion or pneumothorax. There are old healed rib fractures on the right. No acute skeletal abnormality. IMPRESSION: 1. No acute cardiopulmonary disease. 2. COPD. Electronically Signed   By: Lajean Manes M.D.   On: 04/23/2018 14:27   Dg Chest Portable 1 View  Result Date: 04/14/2018 CLINICAL DATA:  Shortness of breath. EXAM: PORTABLE CHEST 1 VIEW COMPARISON:  Radiographs of April 03, 2018. FINDINGS: The heart size and  mediastinal contours are within normal limits. Both lungs are clear. No pneumothorax or pleural effusion is noted. Old right rib fractures are noted. IMPRESSION: No acute cardiopulmonary abnormality seen. Emphysema (ICD10-J43.9). Electronically Signed   By: Marijo Conception, M.D.   On: 04/14/2018 17:30   Dg Abd Portable 2v  Result Date: 04/26/2018 CLINICAL DATA:  Abdominal pain for 2-3 days. Unable to urinate or height bowel movements. EXAM: PORTABLE ABDOMEN - 2 VIEW COMPARISON:  03/08/2018 FINDINGS: There is generalized increased bowel gas, but no bowel dilation to suggest obstruction and no air-fluid levels on the erect view. There is no free air. No significant increase in colonic stool. No evidence of renal or ureteral stones. Soft tissues are unremarkable. Clear lung bases. IMPRESSION: 1. No acute findings.  No evidence of bowel obstruction or free air. 2. Generalized increased bowel gas, nonspecific. Electronically Signed   By: Lajean Manes M.D.   On: 04/26/2018 09:08    Microbiology: Recent Results (from the past 240 hour(s))  Urine culture     Status: Abnormal   Collection Time: 04/25/18 11:45 PM  Result Value Ref Range Status   Specimen Description URINE, CLEAN CATCH  Final   Special Requests NONE  Final   Culture ([place])  Final    <10,000 COLONIES/mL INSIGNIFICANT GROWTH Performed at [facility], [address] [place], [place] [address]    Report Status 04/27/2018 FINAL  Final  Respiratory Panel by PCR     Status: None   Collection Time: 04/26/18  12:39 AM  Result Value Ref Range Status   Adenovirus NOT DETECTED NOT DETECTED Final   Coronavirus 229E NOT DETECTED NOT DETECTED Final    Comment: (NOTE) The Coronavirus on the Respiratory Panel, DOES NOT test for the novel  Coronavirus (2019 nCoV)    Coronavirus HKU1 NOT DETECTED NOT DETECTED Final   Coronavirus NL63 NOT DETECTED NOT DETECTED Final   Coronavirus OC43 NOT DETECTED NOT DETECTED Final   Metapneumovirus NOT  DETECTED NOT DETECTED Final   Rhinovirus / Enterovirus NOT DETECTED NOT DETECTED Final   Influenza [place] NOT DETECTED NOT DETECTED Final   Influenza B NOT DETECTED NOT DETECTED Final   Parainfluenza Virus 1 NOT DETECTED NOT DETECTED Final   Parainfluenza Virus 2 NOT DETECTED NOT DETECTED Final   Parainfluenza Virus 3 NOT DETECTED NOT DETECTED Final   Parainfluenza Virus 4 NOT DETECTED NOT DETECTED Final   Respiratory Syncytial Virus NOT DETECTED NOT DETECTED Final   Bordetella pertussis NOT DETECTED NOT DETECTED Final   Chlamydophila pneumoniae NOT DETECTED NOT DETECTED Final   Mycoplasma pneumoniae NOT DETECTED NOT DETECTED Final    Comment: Performed at [facility], [place] [address] [place] [place], [place] [id number]  Novel Coronavirus, NAA (hospital order; send-out to ref lab)     Status: None   Collection Time: 04/26/18 12:39 AM  Result Value Ref Range Status   SARS-CoV-2, NAA NOT DETECTED NOT DETECTED Final    Comment: Performed at [facility]  Final    Comment: Performed at [facility], [place] [address] [place], [place] [address]  Blood culture (routine x 2)     Status: None (Preliminary result)   Collection Time: 05/01/18  8:15 PM  Result Value Ref Range Status   Specimen Description BLOOD LEFT ANTECUBITAL  Final   Special Requests   Final    BOTTLES DRAWN AEROBIC AND ANAEROBIC Blood Culture results may not be optimal due to an inadequate volume of blood received in culture bottles   Culture   Final    NO GROWTH 2 DAYS Performed at [facility], [place] [address] [place], [place] [address]    Report Status PENDING  Incomplete  Blood culture (routine x 2)     Status: None (Preliminary result)   Collection Time: 05/01/18  8:32 PM  Result Value Ref Range Status   Specimen Description BLOOD RIGHT ANTECUBITAL  Final   Special Requests   Final    BOTTLES DRAWN AEROBIC AND ANAEROBIC Blood Culture results may not be optimal due to an  inadequate volume of blood received in culture bottles   Culture   Final    NO GROWTH 2 DAYS Performed at [facility], [place] [address] [place], [place] [address]    Report Status PENDING  Incomplete     Labs: Basic Metabolic Panel: Recent Labs  Lab 04/27/18 0634 05/01/18 2019 05/01/18 2044 05/01/18 2243 05/02/18 0243 05/02/18 1558 05/03/18 0526  NA 130* 133* 134* 132* 134*  --  134*  K 3.9 4.5 4.7 4.8 5.4* 4.6 4.6  CL 86* 91*  --   --  91*  --  92*  CO2 36* 32  --   --  34*  --  35*  GLUCOSE 124* 133*  --   --  143*  --  166*  BUN 14 20  --   --  20  --  13  CREATININE 0.65 0.71  --   --  0.80  --  0.70  CALCIUM 7.9* 8.6*  --   --  8.7*  --  8.3*  MG  --   --   --   --   --   --  2.2   Liver Function Tests: Recent Labs  Lab 04/27/18 0634 05/01/18 2019 05/03/18 0526  AST 23 19 14*  ALT 20 24 18   ALKPHOS 39 46 39  BILITOT 0.2* 0.6 0.5  PROT 4.8* 6.0* 5.0*  ALBUMIN 3.3* 3.9 3.3*   No results for input(s): LIPASE, AMYLASE in the last 168 hours. No results for input(s): AMMONIA in the last 168 hours. CBC: Recent Labs  Lab 04/28/18 0243 04/29/18 0259 05/01/18 2019 05/01/18 2044 05/01/18 2243 05/02/18 0243 05/03/18 0526  WBC 6.2 6.5 8.7  --   --  8.1 7.4  NEUTROABS 5.8 6.0 7.6  --   --   --   --   HGB 10.7* 11.3* 12.6* 12.9* 11.9* 12.1* 10.4*  HCT 32.2* 34.1* 39.8 38.0* 35.0* 37.8* 30.8*  MCV 92.0 93.9 95.9  --   --  95.7 93.9  PLT 226 228 286  --   --  262 213   Cardiac Enzymes: Recent Labs  Lab 05/01/18 2019  TROPONINI <0.03   BNP: BNP (last 3 results) Recent Labs    03/26/18 1425 04/14/18 1718 05/01/18 2019  BNP 24.2 33.1 45.6    ProBNP (last 3 results) No results for input(s): PROBNP in the last 8760 hours.  CBG: Recent Labs  Lab 05/02/18 1229 05/02/18 1639 05/02/18 2122 05/03/18 0751 05/03/18 1226  GLUCAP 131* 141* 123* 91 162*       Signed:  Fayrene Helper MD.  [facility] 05/03/2018, 1:36 PM

## 2018-05-04 ENCOUNTER — Ambulatory Visit: Payer: Medicaid Other

## 2018-05-05 ENCOUNTER — Ambulatory Visit: Payer: Medicaid Other

## 2018-05-05 ENCOUNTER — Other Ambulatory Visit: Payer: Self-pay

## 2018-05-05 ENCOUNTER — Ambulatory Visit: Payer: Medicaid Other | Attending: Primary Care | Admitting: Primary Care

## 2018-05-06 ENCOUNTER — Ambulatory Visit: Payer: Medicaid Other

## 2018-05-06 LAB — CULTURE, BLOOD (ROUTINE X 2)
Culture: NO GROWTH
Culture: NO GROWTH

## 2018-05-07 ENCOUNTER — Ambulatory Visit: Payer: Medicaid Other

## 2018-05-08 ENCOUNTER — Ambulatory Visit: Payer: Medicaid Other

## 2018-05-11 ENCOUNTER — Ambulatory Visit: Payer: Medicaid Other

## 2018-05-12 ENCOUNTER — Ambulatory Visit: Payer: Medicaid Other

## 2018-05-13 ENCOUNTER — Telehealth: Payer: Self-pay | Admitting: Radiation Oncology

## 2018-05-13 ENCOUNTER — Ambulatory Visit: Payer: Medicaid Other

## 2018-05-13 NOTE — Telephone Encounter (Signed)
Received voicemail message from patient requesting return call. Phoned back to number patient provided [phone]. No answer and voicemail had not been set up. Message very garbled thus unsure of patient's need. Reading patient's discharge note from the ED I suspect he is attempting to reach Dr. Alexis Frock about having his urinary catheter removed but uncertain. This RN is off tomorrow but will attempt to reach patient again when I return on Friday.

## 2018-05-14 ENCOUNTER — Ambulatory Visit: Payer: Medicaid Other

## 2018-05-15 ENCOUNTER — Ambulatory Visit: Payer: Medicaid Other

## 2018-05-15 ENCOUNTER — Encounter (HOSPITAL_COMMUNITY): Payer: Self-pay | Admitting: Emergency Medicine

## 2018-05-15 ENCOUNTER — Emergency Department (HOSPITAL_COMMUNITY): Payer: Medicaid Other

## 2018-05-15 ENCOUNTER — Other Ambulatory Visit: Payer: Self-pay

## 2018-05-15 ENCOUNTER — Emergency Department (HOSPITAL_COMMUNITY)
Admission: EM | Admit: 2018-05-15 | Discharge: 2018-05-15 | Disposition: A | Discharge status: Home / Self Care | Payer: Medicaid Other | Attending: Emergency Medicine | Admitting: Emergency Medicine

## 2018-05-15 DIAGNOSIS — Z87891 Personal history of nicotine dependence: Secondary | ICD-10-CM | POA: Insufficient documentation

## 2018-05-15 DIAGNOSIS — R1084 Generalized abdominal pain: Secondary | ICD-10-CM | POA: Insufficient documentation

## 2018-05-15 DIAGNOSIS — E119 Type 2 diabetes mellitus without complications: Secondary | ICD-10-CM | POA: Insufficient documentation

## 2018-05-15 DIAGNOSIS — J449 Chronic obstructive pulmonary disease, unspecified: Secondary | ICD-10-CM | POA: Insufficient documentation

## 2018-05-15 DIAGNOSIS — Z7984 Long term (current) use of oral hypoglycemic drugs: Secondary | ICD-10-CM | POA: Insufficient documentation

## 2018-05-15 DIAGNOSIS — R197 Diarrhea, unspecified: Secondary | ICD-10-CM | POA: Diagnosis not present

## 2018-05-15 DIAGNOSIS — J45909 Unspecified asthma, uncomplicated: Secondary | ICD-10-CM | POA: Diagnosis not present

## 2018-05-15 DIAGNOSIS — Z8546 Personal history of malignant neoplasm of prostate: Secondary | ICD-10-CM | POA: Insufficient documentation

## 2018-05-15 DIAGNOSIS — Z79899 Other long term (current) drug therapy: Secondary | ICD-10-CM | POA: Diagnosis not present

## 2018-05-15 DIAGNOSIS — R1030 Lower abdominal pain, unspecified: Secondary | ICD-10-CM | POA: Diagnosis present

## 2018-05-15 LAB — URINALYSIS, ROUTINE W REFLEX MICROSCOPIC
Bilirubin Urine: NEGATIVE
Glucose, UA: NEGATIVE mg/dL
Hgb urine dipstick: NEGATIVE
Ketones, ur: NEGATIVE mg/dL
Leukocytes,Ua: NEGATIVE
Nitrite: NEGATIVE
Protein, ur: NEGATIVE mg/dL
Specific Gravity, Urine: >1.046 — ABNORMAL HIGH (ref 1.005–1.030)
pH: 6 (ref 5.0–8.0)

## 2018-05-15 LAB — COMPREHENSIVE METABOLIC PANEL
ALT: 24 U/L (ref 0–44)
AST: 19 U/L (ref 15–41)
Albumin: 5.1 g/dL — ABNORMAL HIGH (ref 3.5–5.0)
Alkaline Phosphatase: 56 U/L (ref 38–126)
Anion gap: 10 (ref 5–15)
BUN: 16 mg/dL (ref 8–23)
CO2: 38 mmol/L — ABNORMAL HIGH (ref 22–32)
Calcium: 9.1 mg/dL (ref 8.9–10.3)
Chloride: 92 mmol/L — ABNORMAL LOW (ref 98–111)
Creatinine, Ser: 0.64 mg/dL (ref 0.61–1.24)
GFR calc Af Amer: >60 mL/min (ref >60)
GFR calc non Af Amer: >60 mL/min (ref >60)
Glucose, Bld: 133 mg/dL — ABNORMAL HIGH (ref 70–99)
Potassium: 4.9 mmol/L (ref 3.5–5.1)
Sodium: 140 mmol/L (ref 135–145)
Total Bilirubin: 0.7 mg/dL (ref 0.3–1.2)
Total Protein: 7.9 g/dL (ref 6.5–8.1)

## 2018-05-15 LAB — CBC
HCT: 47.6 % (ref 39.0–52.0)
Hemoglobin: 15.1 g/dL (ref 13.0–17.0)
MCH: 32 pg (ref 26.0–34.0)
MCHC: 31.7 g/dL (ref 30.0–36.0)
MCV: 100.8 fL — ABNORMAL HIGH (ref 80.0–100.0)
Platelets: 225 10*3/uL (ref 150–400)
RBC: 4.72 MIL/uL (ref 4.22–5.81)
RDW: 13.7 % (ref 11.5–15.5)
WBC: 6.3 10*3/uL (ref 4.0–10.5)
nRBC: 0 % (ref 0.0–0.2)

## 2018-05-15 LAB — LIPASE, BLOOD: Lipase: 29 U/L (ref 11–51)

## 2018-05-15 MED ORDER — SODIUM CHLORIDE (PF) 0.9 % IJ SOLN
INTRAMUSCULAR | Status: AC
  Filled 2018-05-15: qty 50

## 2018-05-15 MED ORDER — IOHEXOL 300 MG/ML  SOLN
100.0000 mL | Freq: Once | INTRAMUSCULAR | Status: AC | PRN
  Administered 2018-05-15: 100 mL via INTRAVENOUS

## 2018-05-15 MED ORDER — SODIUM CHLORIDE 0.9 % IV BOLUS
1000.0000 mL | Freq: Once | INTRAVENOUS | Status: AC
  Administered 2018-05-15: 18:00:00 1000 mL via INTRAVENOUS

## 2018-05-15 NOTE — ED Triage Notes (Signed)
Per [facility] pt from home for abd pains and diarrhea since being discharged on 4/12.  Pt being treated for prostate cancer. Pt having pain at insertion site of foley catheter since it was placed at [facility] couple weeks ago. Pt also having SOB, pt is smoke and has COPD pt on 3L O2 via nasal canula, pt has decreased lung sound in lower lobes and wheezing.  EMS EKG was sent to [facility] prior to arrival.  102/72, 98% on 3L, CBG 170, 84HR
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
## 2018-05-15 NOTE — ED Notes (Signed)
Pt is going to [address] [place]. PTAR requested.

## 2018-05-15 NOTE — Discharge Instructions (Signed)
You were seen in the emergency department for abdominal pain and diarrhea along with discomfort from your Foley.  You had blood work and a CAT scan of your abdomen and pelvis that did not show an obvious explanation for your symptoms.  You should use Imodium for your diarrhea and try to keep well-hydrated.  Please contact your doctor for close follow-up.  Return if any worsening symptoms.

## 2018-05-18 ENCOUNTER — Ambulatory Visit: Payer: Medicaid Other

## 2018-05-19 ENCOUNTER — Ambulatory Visit: Payer: Medicaid Other

## 2018-05-20 ENCOUNTER — Telehealth: Payer: Self-pay | Admitting: Radiation Oncology

## 2018-05-20 ENCOUNTER — Encounter: Payer: Self-pay | Admitting: General Practice

## 2018-05-20 ENCOUNTER — Ambulatory Visit: Payer: Medicaid Other

## 2018-05-20 NOTE — Telephone Encounter (Addendum)
Phoned patient to inquire about status. Patient has not presented for a radiation treatment since 04/16/2018 completing 10 of 40 planned treatments for prostate ca. Phoned mobile number. No answer and no option to leave a message. Phoned temporary number. The man on the other end of the phone reported that the patient used to use his phone but doesn't anymore and he is uncertain of how to reach him anymore. Temporary number deleted from profile. Will share findings with Dr. Tammi Klippel, Cira Rue (prostate navigator), and Edwyna Shell, LCSW.

## 2018-05-21 ENCOUNTER — Telehealth: Payer: Self-pay | Admitting: Medical Oncology

## 2018-05-21 ENCOUNTER — Ambulatory Visit: Payer: Medicaid Other

## 2018-05-21 NOTE — Telephone Encounter (Signed)
Opened in error

## 2018-05-21 NOTE — Telephone Encounter (Signed)
Spoke with patient to see how he is doing. He has not shown for radiation since 3/26. He states he's had several hospital admissions for his breathing and he is having trouble urinating. He had a foley placed 4/11 during this admission. He had a follow up appointment on  4/20 with Dr. Jeffie Pollock and was a no show. This appointment rescheduled for 5/6 and he voiced confirmation. He would like to hold radiation until his appointment with Dr. Jeffie Pollock. I asked him to call with questions or concerns and I will forward the above to Dr. Tammi Klippel and Sam.

## 2018-05-22 ENCOUNTER — Ambulatory Visit: Payer: No Typology Code available for payment source

## 2018-05-22 ENCOUNTER — Ambulatory Visit: Payer: No Typology Code available for payment source | Attending: Radiation Oncology

## 2018-05-22 DIAGNOSIS — C61 Malignant neoplasm of prostate: Secondary | ICD-10-CM | POA: Insufficient documentation

## 2018-05-22 DIAGNOSIS — Z510 Encounter for antineoplastic radiation therapy: Secondary | ICD-10-CM | POA: Insufficient documentation

## 2018-05-25 ENCOUNTER — Other Ambulatory Visit: Payer: Self-pay

## 2018-05-25 ENCOUNTER — Ambulatory Visit: Payer: No Typology Code available for payment source

## 2018-05-25 ENCOUNTER — Encounter (HOSPITAL_COMMUNITY): Payer: Self-pay | Admitting: Internal Medicine

## 2018-05-25 ENCOUNTER — Inpatient Hospital Stay (HOSPITAL_COMMUNITY)
Admission: EM | Admit: 2018-05-25 | Discharge: 2018-05-28 | DRG: 190 | Disposition: A | Discharge status: Home / Self Care | Payer: Medicaid Other | Attending: Family Medicine | Admitting: Family Medicine

## 2018-05-25 ENCOUNTER — Emergency Department (HOSPITAL_COMMUNITY): Payer: Medicaid Other

## 2018-05-25 DIAGNOSIS — J9601 Acute respiratory failure with hypoxia: Secondary | ICD-10-CM | POA: Diagnosis not present

## 2018-05-25 DIAGNOSIS — F419 Anxiety disorder, unspecified: Secondary | ICD-10-CM | POA: Diagnosis present

## 2018-05-25 DIAGNOSIS — F1410 Cocaine abuse, uncomplicated: Secondary | ICD-10-CM

## 2018-05-25 DIAGNOSIS — C61 Malignant neoplasm of prostate: Secondary | ICD-10-CM | POA: Diagnosis present

## 2018-05-25 DIAGNOSIS — Z8701 Personal history of pneumonia (recurrent): Secondary | ICD-10-CM

## 2018-05-25 DIAGNOSIS — Z9981 Dependence on supplemental oxygen: Secondary | ICD-10-CM

## 2018-05-25 DIAGNOSIS — T380X5A Adverse effect of glucocorticoids and synthetic analogues, initial encounter: Secondary | ICD-10-CM | POA: Diagnosis present

## 2018-05-25 DIAGNOSIS — Z87891 Personal history of nicotine dependence: Secondary | ICD-10-CM

## 2018-05-25 DIAGNOSIS — Z885 Allergy status to narcotic agent status: Secondary | ICD-10-CM

## 2018-05-25 DIAGNOSIS — Z8249 Family history of ischemic heart disease and other diseases of the circulatory system: Secondary | ICD-10-CM

## 2018-05-25 DIAGNOSIS — I10 Essential (primary) hypertension: Secondary | ICD-10-CM | POA: Diagnosis present

## 2018-05-25 DIAGNOSIS — Z7984 Long term (current) use of oral hypoglycemic drugs: Secondary | ICD-10-CM

## 2018-05-25 DIAGNOSIS — Z85038 Personal history of other malignant neoplasm of large intestine: Secondary | ICD-10-CM

## 2018-05-25 DIAGNOSIS — J441 Chronic obstructive pulmonary disease with (acute) exacerbation: Secondary | ICD-10-CM | POA: Diagnosis not present

## 2018-05-25 DIAGNOSIS — F319 Bipolar disorder, unspecified: Secondary | ICD-10-CM | POA: Diagnosis present

## 2018-05-25 DIAGNOSIS — J9621 Acute and chronic respiratory failure with hypoxia: Secondary | ICD-10-CM | POA: Diagnosis present

## 2018-05-25 DIAGNOSIS — Z66 Do not resuscitate: Secondary | ICD-10-CM | POA: Diagnosis present

## 2018-05-25 DIAGNOSIS — Z760 Encounter for issue of repeat prescription: Secondary | ICD-10-CM

## 2018-05-25 DIAGNOSIS — Z801 Family history of malignant neoplasm of trachea, bronchus and lung: Secondary | ICD-10-CM

## 2018-05-25 DIAGNOSIS — Z9114 Patient's other noncompliance with medication regimen: Secondary | ICD-10-CM

## 2018-05-25 DIAGNOSIS — E099 Drug or chemical induced diabetes mellitus without complications: Secondary | ICD-10-CM | POA: Diagnosis present

## 2018-05-25 DIAGNOSIS — R339 Retention of urine, unspecified: Secondary | ICD-10-CM | POA: Diagnosis present

## 2018-05-25 DIAGNOSIS — Z20828 Contact with and (suspected) exposure to other viral communicable diseases: Secondary | ICD-10-CM | POA: Diagnosis present

## 2018-05-25 DIAGNOSIS — K649 Unspecified hemorrhoids: Secondary | ICD-10-CM | POA: Diagnosis present

## 2018-05-25 DIAGNOSIS — Z79899 Other long term (current) drug therapy: Secondary | ICD-10-CM

## 2018-05-25 LAB — CBC WITH DIFFERENTIAL/PLATELET
Abs Immature Granulocytes: 0.07 10*3/uL (ref 0.00–0.07)
Basophils Absolute: 0 10*3/uL (ref 0.0–0.1)
Basophils Relative: 0 %
Eosinophils Absolute: 0 10*3/uL (ref 0.0–0.5)
Eosinophils Relative: 1 %
HCT: 40.8 % (ref 39.0–52.0)
Hemoglobin: 13.1 g/dL (ref 13.0–17.0)
Immature Granulocytes: 1 %
Lymphocytes Relative: 13 %
Lymphs Abs: 0.9 10*3/uL (ref 0.7–4.0)
MCH: 31 pg (ref 26.0–34.0)
MCHC: 32.1 g/dL (ref 30.0–36.0)
MCV: 96.5 fL (ref 80.0–100.0)
Monocytes Absolute: 0.5 10*3/uL (ref 0.1–1.0)
Monocytes Relative: 8 %
Neutro Abs: 5.2 10*3/uL (ref 1.7–7.7)
Neutrophils Relative %: 77 %
Platelets: 262 10*3/uL (ref 150–400)
RBC: 4.23 MIL/uL (ref 4.22–5.81)
RDW: 13.1 % (ref 11.5–15.5)
WBC: 6.6 10*3/uL (ref 4.0–10.5)
nRBC: 0 % (ref 0.0–0.2)

## 2018-05-25 LAB — COMPREHENSIVE METABOLIC PANEL
ALT: 17 U/L (ref 0–44)
AST: 20 U/L (ref 15–41)
Albumin: 4.3 g/dL (ref 3.5–5.0)
Alkaline Phosphatase: 48 U/L (ref 38–126)
Anion gap: 11 (ref 5–15)
BUN: 10 mg/dL (ref 8–23)
CO2: 36 mmol/L — ABNORMAL HIGH (ref 22–32)
Calcium: 8.6 mg/dL — ABNORMAL LOW (ref 8.9–10.3)
Chloride: 83 mmol/L — ABNORMAL LOW (ref 98–111)
Creatinine, Ser: 0.66 mg/dL (ref 0.61–1.24)
GFR calc Af Amer: >60 mL/min (ref >60)
GFR calc non Af Amer: >60 mL/min (ref >60)
Glucose, Bld: 162 mg/dL — ABNORMAL HIGH (ref 70–99)
Potassium: 4.8 mmol/L (ref 3.5–5.1)
Sodium: 130 mmol/L — ABNORMAL LOW (ref 135–145)
Total Bilirubin: 0.6 mg/dL (ref 0.3–1.2)
Total Protein: 6.3 g/dL — ABNORMAL LOW (ref 6.5–8.1)

## 2018-05-25 LAB — D-DIMER, QUANTITATIVE: D-Dimer, Quant: 0.31 ug/mL-FEU (ref 0.00–0.50)

## 2018-05-25 LAB — SARS CORONAVIRUS 2 BY RT PCR (HOSPITAL ORDER, PERFORMED IN ~~LOC~~ HOSPITAL LAB): SARS Coronavirus 2: NEGATIVE

## 2018-05-25 LAB — BRAIN NATRIURETIC PEPTIDE: B Natriuretic Peptide: 81.3 pg/mL (ref 0.0–100.0)

## 2018-05-25 MED ORDER — PRAVASTATIN SODIUM 10 MG PO TABS
20.0000 mg | ORAL_TABLET | Freq: Every day | ORAL | Status: DC
  Administered 2018-05-26 – 2018-05-27 (×2): 20 mg via ORAL
  Filled 2018-05-25 (×2): qty 2

## 2018-05-25 MED ORDER — ACETAMINOPHEN 325 MG PO TABS
650.0000 mg | ORAL_TABLET | Freq: Four times a day (QID) | ORAL | Status: DC | PRN
  Administered 2018-05-27: 650 mg via ORAL
  Filled 2018-05-25: qty 2

## 2018-05-25 MED ORDER — INSULIN ASPART 100 UNIT/ML ~~LOC~~ SOLN
0.0000 [IU] | Freq: Three times a day (TID) | SUBCUTANEOUS | Status: DC
  Administered 2018-05-26: 5 [IU] via SUBCUTANEOUS
  Administered 2018-05-26: 2 [IU] via SUBCUTANEOUS
  Administered 2018-05-27: 3 [IU] via SUBCUTANEOUS
  Administered 2018-05-28: 5 [IU] via SUBCUTANEOUS
  Administered 2018-05-28: 2 [IU] via SUBCUTANEOUS

## 2018-05-25 MED ORDER — TAMSULOSIN HCL 0.4 MG PO CAPS
0.8000 mg | ORAL_CAPSULE | Freq: Every day | ORAL | Status: DC
  Administered 2018-05-26 – 2018-05-28 (×3): 0.8 mg via ORAL
  Filled 2018-05-25 (×3): qty 2

## 2018-05-25 MED ORDER — ARIPIPRAZOLE 5 MG PO TABS
5.0000 mg | ORAL_TABLET | Freq: Every day | ORAL | Status: DC
  Administered 2018-05-26 – 2018-05-28 (×3): 5 mg via ORAL
  Filled 2018-05-25 (×3): qty 1

## 2018-05-25 MED ORDER — ONDANSETRON HCL 4 MG PO TABS: 4.0000 mg | ORAL_TABLET | Freq: Four times a day (QID) | ORAL | Status: DC | PRN

## 2018-05-25 MED ORDER — ALBUTEROL SULFATE (2.5 MG/3ML) 0.083% IN NEBU
3.0000 mL | INHALATION_SOLUTION | RESPIRATORY_TRACT | Status: DC
  Administered 2018-05-26 – 2018-05-28 (×17): 3 mL via RESPIRATORY_TRACT
  Filled 2018-05-25 (×17): qty 3

## 2018-05-25 MED ORDER — ALBUTEROL SULFATE (2.5 MG/3ML) 0.083% IN NEBU: 3.0000 mL | INHALATION_SOLUTION | RESPIRATORY_TRACT | Status: DC | PRN

## 2018-05-25 MED ORDER — UMECLIDINIUM BROMIDE 62.5 MCG/INH IN AEPB
1.0000 | INHALATION_SPRAY | Freq: Every day | RESPIRATORY_TRACT | Status: DC
  Administered 2018-05-26 – 2018-05-28 (×3): 1 via RESPIRATORY_TRACT
  Filled 2018-05-25: qty 7

## 2018-05-25 MED ORDER — ONDANSETRON HCL 4 MG/2ML IJ SOLN
4.0000 mg | Freq: Once | INTRAMUSCULAR | Status: AC
  Administered 2018-05-25: 4 mg via INTRAVENOUS
  Filled 2018-05-25: qty 2

## 2018-05-25 MED ORDER — ALBUTEROL SULFATE HFA 108 (90 BASE) MCG/ACT IN AERS: 2.0000 | INHALATION_SPRAY | Freq: Four times a day (QID) | RESPIRATORY_TRACT | Status: DC

## 2018-05-25 MED ORDER — METHYLPREDNISOLONE SODIUM SUCC 40 MG IJ SOLR
40.0000 mg | Freq: Two times a day (BID) | INTRAMUSCULAR | Status: DC
  Administered 2018-05-26 – 2018-05-28 (×5): 40 mg via INTRAVENOUS
  Filled 2018-05-25 (×5): qty 1

## 2018-05-25 MED ORDER — ONDANSETRON HCL 4 MG/2ML IJ SOLN: 4.0000 mg | Freq: Four times a day (QID) | INTRAMUSCULAR | Status: DC | PRN

## 2018-05-25 MED ORDER — ALBUTEROL SULFATE HFA 108 (90 BASE) MCG/ACT IN AERS
2.0000 | INHALATION_SPRAY | Freq: Four times a day (QID) | RESPIRATORY_TRACT | Status: DC
  Administered 2018-05-25: 2 via RESPIRATORY_TRACT
  Filled 2018-05-25: qty 6.7

## 2018-05-25 MED ORDER — ENOXAPARIN SODIUM 40 MG/0.4ML ~~LOC~~ SOLN
40.0000 mg | SUBCUTANEOUS | Status: DC
  Administered 2018-05-26 – 2018-05-28 (×3): 40 mg via SUBCUTANEOUS
  Filled 2018-05-25 (×3): qty 0.4

## 2018-05-25 MED ORDER — METOPROLOL TARTRATE 12.5 MG HALF TABLET
12.5000 mg | ORAL_TABLET | Freq: Two times a day (BID) | ORAL | Status: DC
  Administered 2018-05-26 (×3): 12.5 mg via ORAL
  Filled 2018-05-25 (×3): qty 1

## 2018-05-25 MED ORDER — ACETAMINOPHEN 650 MG RE SUPP: 650.0000 mg | Freq: Four times a day (QID) | RECTAL | Status: DC | PRN

## 2018-05-25 MED ORDER — MOMETASONE FURO-FORMOTEROL FUM 100-5 MCG/ACT IN AERO
2.0000 | INHALATION_SPRAY | Freq: Two times a day (BID) | RESPIRATORY_TRACT | Status: DC
  Administered 2018-05-26 – 2018-05-28 (×5): 2 via RESPIRATORY_TRACT
  Filled 2018-05-25 (×2): qty 8.8

## 2018-05-25 MED ORDER — ESCITALOPRAM OXALATE 20 MG PO TABS
20.0000 mg | ORAL_TABLET | Freq: Every day | ORAL | Status: DC
  Administered 2018-05-26 – 2018-05-28 (×3): 20 mg via ORAL
  Filled 2018-05-25 (×3): qty 1

## 2018-05-25 NOTE — ED Notes (Signed)
ED TO INPATIENT HANDOFF REPORT  ED Nurse Name and Phone #:  [phone]  S Name/Age/Gender Taylor [name] 63 y.o. male Room/Bed: 026C/026C  Code Status   Code Status: Full Code  Home/SNF/Other Home Patient oriented to: self, time and situation Is this baseline? Yes   Triage Complete: Triage complete  Chief Complaint SOB  Triage Note Pt called EMS from home c/o increasing SOB.. Typically on #L @ home when EMS arrived he was satting in the 80's on 3L.  Put on NRB which put up to 100%.  125 Solumedrol and 2 G mag given en route. Indwelling foley in place on arrival. Hstry of CA.   Allergies Allergies  Allergen Reactions  . Codeine Nausea And Vomiting    Level of Care/Admitting Diagnosis ED Disposition    ED Disposition Condition Comment   Admit  Hospital Area: [place] [[id number]]  Level of Care: Telemetry Medical [104]  I expect the patient will be discharged within 24 hours: No (not a candidate for 5C-Observation unit)  Covid Evaluation: N/A  Diagnosis: Acute respiratory failure with hypoxia [facility]) [[id number]]  Admitting Physician: Rise Patience [phone]  Attending Physician: Rise Patience [email or web address]  PT Class (Do Not Modify): Observation [104]  PT Acc Code (Do Not Modify): Observation [10022]       B Medical/Surgery History Past Medical History:  Diagnosis Date  . Acute on chronic respiratory failure ([place])   . Asthma   . Bipolar 1 disorder ([place]) 02/05/2012  . Colon cancer ([place]) 04/11/11   adenocarcinoma of colon, 7/19 nodes pos.FINISHED CHEMO/DR. SHERRILL  . COPD (chronic obstructive pulmonary disease) ([place])    SMOKER  . Depression   . Dyspnea   . Emphysema of lung ([place])   . Full dentures   . Hemorrhoids   . On home oxygen therapy    "3L; 24/7" (05/29/2017)  . Oxygen deficiency   . Pneumonia ~ 2016   "double pneumonia"  . Prostate cancer ([place])    Gleason score = 7, supposed to have radiation therapy but he has not followed up  (05/29/2017)  . Rib fractures    hx of   Past Surgical History:  Procedure Laterality Date  . COLON SURGERY  04/11/11   Sigmoid colectomy  . COLOSTOMY REVISION  04/11/2011   Procedure: COLON RESECTION SIGMOID;  Surgeon: [name], MD;  Location: [facility];  Service: General;  Laterality: N/A;  low anterior colon resection   . GOLD SEED IMPLANT N/A 02/24/2018   Procedure: GOLD SEED IMPLANT;  Surgeon: [name], MD;  Location: [facility];  Service: Urology;  Laterality: N/A;  . HERNIA REPAIR    . INGUINAL HERNIA REPAIR Right 05/01/2012   Procedure: HERNIA REPAIR INGUINAL ADULT;  Surgeon: [name], MD;  Location: [facility];  Service: General;  Laterality: Right;  . INSERTION OF MESH Right 05/01/2012   Procedure: INSERTION OF MESH;  Surgeon: [name], MD;  Location: [facility];  Service: General;  Laterality: Right;  . PORT-A-CATH REMOVAL Left 05/01/2012   Procedure: REMOVAL Infusion Port;  Surgeon: [name], MD;  Location: [facility];  Service: General;  Laterality: Left;  . PORTACATH PLACEMENT  05/02/2011   Procedure: INSERTION PORT-A-CATH;  Surgeon: [name], MD;  Location: [facility];  Service: General;  Laterality: N/A;  . PROSTATE BIOPSY    . SPACE OAR INSTILLATION N/A 02/24/2018   Procedure: SPACE OAR INSTILLATION;  Surgeon: [name], MD;  Location: [facility];  Service:  Urology;  Laterality: N/A;     A IV Location/Drains/Wounds Patient Lines/Drains/Airways Status   Active Line/Drains/Airways    Name:   Placement date:   Placement time:   Site:   Days:   Peripheral IV 05/25/18 Left Antecubital   05/25/18    2019    Antecubital   less than 1   Urethral Catheter Azucena Cecil RN 14 Fr.   05/02/18    2159    -   23          Intake/Output Last 24 hours No intake or output data in the 24 hours ending 05/25/18 2317  Labs/Imaging Results for orders placed or performed during the hospital encounter of 05/25/18 (from the past 48 hour(s))  Comprehensive metabolic panel     Status:  Abnormal   Collection Time: 05/25/18  8:21 PM  Result Value Ref Range   Sodium 130 (L) 135 - 145 mmol/L   Potassium 4.8 3.5 - 5.1 mmol/L   Chloride 83 (L) 98 - 111 mmol/L   CO2 36 (H) 22 - 32 mmol/L   Glucose, Bld 162 (H) 70 - 99 mg/dL   BUN 10 8 - 23 mg/dL   Creatinine, Ser 0.66 0.61 - 1.24 mg/dL   Calcium 8.6 (L) 8.9 - 10.3 mg/dL   Total Protein 6.3 (L) 6.5 - 8.1 g/dL   Albumin 4.3 3.5 - 5.0 g/dL   AST 20 15 - 41 U/L   ALT 17 0 - 44 U/L   Alkaline Phosphatase 48 38 - 126 U/L   Total Bilirubin 0.6 0.3 - 1.2 mg/dL   GFR calc non Af Amer >60 >60 mL/min   GFR calc Af Amer >60 >60 mL/min   Anion gap 11 5 - 15    Comment: Performed at [facility], [address], [place], [place] [address]  CBC WITH DIFFERENTIAL     Status: None   Collection Time: 05/25/18  8:21 PM  Result Value Ref Range   WBC 6.6 4.0 - 10.5 K/uL   RBC 4.23 4.22 - 5.81 MIL/uL   Hemoglobin 13.1 13.0 - 17.0 g/dL   HCT 40.8 39.0 - 52.0 %   MCV 96.5 80.0 - 100.0 fL   MCH 31.0 26.0 - 34.0 pg   MCHC 32.1 30.0 - 36.0 g/dL   RDW 13.1 11.5 - 15.5 %   Platelets 262 150 - 400 K/uL   nRBC 0.0 0.0 - 0.2 %   Neutrophils Relative % 77 %   Neutro Abs 5.2 1.7 - 7.7 K/uL   Lymphocytes Relative 13 %   Lymphs Abs 0.9 0.7 - 4.0 K/uL   Monocytes Relative 8 %   Monocytes Absolute 0.5 0.1 - 1.0 K/uL   Eosinophils Relative 1 %   Eosinophils Absolute 0.0 0.0 - 0.5 K/uL   Basophils Relative 0 %   Basophils Absolute 0.0 0.0 - 0.1 K/uL   Immature Granulocytes 1 %   Abs Immature Granulocytes 0.07 0.00 - 0.07 K/uL    Comment: Performed at [facility], [place] [address], [place], [place] [address]  Brain natriuretic peptide     Status: None   Collection Time: 05/25/18  8:21 PM  Result Value Ref Range   B Natriuretic Peptide 81.3 0.0 - 100.0 pg/mL    Comment: Performed at [facility] [address], [place],  [address]  D-dimer, quantitative (not at [facility])     Status: None   Collection Time: 05/25/18  8:21  PM  Result Value Ref Range   D-Dimer, Quant 0.31 0.00 - 0.50 ug/mL-FEU    Comment: (NOTE) At the manufacturer cut-off of 0.50 ug/mL FEU, this assay has been documented to exclude PE with a sensitivity and negative predictive value of 97 to 99%.  At this time, this assay has not been approved by the FDA to exclude DVT/VTE. Results should be correlated with clinical presentation. Performed at [facility], [place] [address] [place], [place] [address]   SARS Coronavirus 2 (CEPHEID- Performed in [facility] hospital lab), Hosp Order     Status: None   Collection Time: 05/25/18  8:22 PM  Result Value Ref Range   SARS Coronavirus 2 NEGATIVE NEGATIVE    Comment: (NOTE) If result is NEGATIVE SARS-CoV-2 target nucleic acids are NOT DETECTED. The SARS-CoV-2 RNA is generally detectable in upper and lower  respiratory specimens during the acute phase of infection. The lowest  concentration of SARS-CoV-2 viral copies this assay can detect is 250  copies / mL. A negative result does not preclude SARS-CoV-2 infection  and should not be used as the sole basis for treatment or other  patient management decisions.  A negative result may occur with  improper specimen collection / handling, submission of specimen other  than nasopharyngeal swab, presence of viral mutation(s) within the  areas targeted by this assay, and inadequate number of viral copies  (<250 copies / mL). A negative result must be combined with clinical  observations, patient history, and epidemiological information. If result is POSITIVE SARS-CoV-2 target nucleic acids are DETECTED. The SARS-CoV-2 RNA is generally detectable in upper and lower  respiratory specimens dur ing the acute phase of infection.  Positive  results are indicative of active infection with SARS-CoV-2.  Clinical  correlation with patient history and other diagnostic information is  necessary to determine patient infection status.  Positive results do   not rule out bacterial infection or co-infection with other viruses. If result is PRESUMPTIVE POSTIVE SARS-CoV-2 nucleic acids MAY BE PRESENT.   A presumptive positive result was obtained on the submitted specimen  and confirmed on repeat testing.  While 2019 novel coronavirus  (SARS-CoV-2) nucleic acids may be present in the submitted sample  additional confirmatory testing may be necessary for epidemiological  and / or clinical management purposes  to differentiate between  SARS-CoV-2 and other Sarbecovirus currently known to infect humans.  If clinically indicated additional testing with an alternate test  methodology [phone]) is advised. The SARS-CoV-2 RNA is generally  detectable in upper and lower respiratory sp ecimens during the acute  phase of infection. The expected result is Negative. Fact Sheet for Patients:  StrictlyIdeas.no Fact Sheet for Healthcare Providers: BankingDealers.co.za This test is not yet approved or cleared by the [place] FDA and has been authorized for detection and/or diagnosis of SARS-CoV-2 by FDA under an Emergency Use Authorization (EUA).  This EUA will remain in effect (meaning this test can be used) for the duration of the COVID-19 declaration under Section 564(b)(1) of the Act, 21 U.S.C. section 360bbb-3(b)(1), unless the authorization is terminated or revoked sooner. Performed at [facility], [place] [address], [place], [place] [address]    Dg Chest Port 1 View  Result Date: 05/25/2018 CLINICAL DATA:  Respiratory distress, history colon cancer, asthma, COPD, prostate cancer, former smoker, hypertension EXAM: PORTABLE CHEST 1 VIEW COMPARISON:  Portable exam 2112 hours compared to 05/01/2018 FINDINGS: Normal heart size, mediastinal contours, and pulmonary vascularity. Emphysematous and bronchitic changes consistent  with COPD. No acute infiltrate, pleural effusion, or pneumothorax. Multiple old  upper RIGHT rib fractures. No acute osseous findings. IMPRESSION: COPD changes without acute abnormalities. Electronically Signed   By: Lavonia Dana M.D.   On: 05/25/2018 21:25    Pending Labs Unresulted Labs (From admission, onward)    Start     Ordered   06/01/18 0500  Creatinine, serum  (enoxaparin (LOVENOX)    CrCl >/= 30 ml/min)  Weekly,   R    Comments:  while on enoxaparin therapy    05/25/18 2305   05/26/18 7035  Basic metabolic panel  Tomorrow morning,   R     05/25/18 2305   05/26/18 0500  CBC  Tomorrow morning,   R     05/25/18 2305   05/25/18 2304  CBC  (enoxaparin (LOVENOX)    CrCl >/= 30 ml/min)  Once,   R    Comments:  Baseline for enoxaparin therapy IF NOT ALREADY DRAWN.  Notify MD if PLT < 100 K.    05/25/18 2305   05/25/18 2304  Creatinine, serum  (enoxaparin (LOVENOX)    CrCl >/= 30 ml/min)  Once,   R    Comments:  Baseline for enoxaparin therapy IF NOT ALREADY DRAWN.    05/25/18 2305          Vitals/Pain Today's Vitals   05/25/18 2145 05/25/18 2200 05/25/18 2215 05/25/18 2225  BP: (!) 112/59 110/70 114/69   Pulse: 88 95 (!) 117   Resp: (!) 31 (!) 29 (!) 35   Temp:      TempSrc:      SpO2: 99% 99% 100%   Weight:      Height:      PainSc:    0-No pain    Isolation Precautions No active isolations  Medications Medications  albuterol (PROVENTIL) (2.5 MG/3ML) 0.083% nebulizer solution 3 mL (has no administration in time range)  albuterol (PROVENTIL) (2.5 MG/3ML) 0.083% nebulizer solution 3 mL (has no administration in time range)  pravastatin (PRAVACHOL) tablet 20 mg (has no administration in time range)  metoprolol tartrate (LOPRESSOR) tablet 12.5 mg (has no administration in time range)  ARIPiprazole (ABILIFY) tablet 5 mg (has no administration in time range)  escitalopram (LEXAPRO) tablet 20 mg (has no administration in time range)  tamsulosin (FLOMAX) capsule 0.8 mg (has no administration in time range)  mometasone-formoterol (DULERA) 100-5  MCG/ACT inhaler 2 puff (has no administration in time range)  tiotropium (SPIRIVA) inhalation capsule ([facility] use ONLY) 18 mcg (has no administration in time range)  acetaminophen (TYLENOL) tablet 650 mg (has no administration in time range)    Or  acetaminophen (TYLENOL) suppository 650 mg (has no administration in time range)  ondansetron (ZOFRAN) tablet 4 mg (has no administration in time range)    Or  ondansetron (ZOFRAN) injection 4 mg (has no administration in time range)  insulin aspart (novoLOG) injection 0-9 Units (has no administration in time range)  enoxaparin (LOVENOX) injection 40 mg (has no administration in time range)  methylPREDNISolone sodium succinate (SOLU-MEDROL) 40 mg/mL injection 40 mg (has no administration in time range)  ondansetron (ZOFRAN) injection 4 mg (4 mg Intravenous Given 05/25/18 2041)    Mobility walks with person assist Low fall risk   Focused Assessments Pulmonary Assessment Handoff:  Lung sounds: Bilateral Breath Sounds: Expiratory wheezes, Coarse crackles L Breath Sounds: Coarse crackles, Rhonchi R Breath Sounds: Coarse crackles, Rhonchi O2 Device: Nasal Cannula O2 Flow Rate (L/min): 3 L/min  R Recommendations: See Admitting Provider Note  Report given to:   Additional Notes:  Patient is on 3L @ home and currently on 3L.
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
## 2018-05-25 NOTE — H&P (Signed)
History and Physical    Taylor [name] PFX:[id number] DOB: May 15, 1955 DOA: 05/25/2018  PCP: Clent Demark, PA-C  Patient coming from: Home.  Chief Complaint: Shortness of breath.  HPI: Taylor [name] is a 63 y.o. male with history of COPD with ongoing tobacco abuse, bipolar disorder, hypertension, diabetes mellitus who was admitted last month for COPD exacerbation presents to the ER because of worsening shortness of breath over the last 24 hours.  Patient states he has not been taking his medications for last 1 week as he has ran out.  Denies any fever chills or sick contacts.  Has had some brief episode of chest pain yesterday which lasted for few minutes which resolved.  Patient said he actually thought he was dehydrated.  But did not have any nausea vomiting or diarrhea.  ED Course: In the ER patient is found to be wheezing chest x-ray was unremarkable patient was afebrile.  COVID-19 was negative.  Patient was given Solu-Medrol albuterol and admitted for COPD exacerbation.  Review of Systems: As per HPI, rest all negative.   Past Medical History:  Diagnosis Date  . Acute on chronic respiratory failure ([place])   . Asthma   . Bipolar 1 disorder ([place]) 02/05/2012  . Colon cancer ([place]) 04/11/11   adenocarcinoma of colon, 7/19 nodes pos.FINISHED CHEMO/DR. SHERRILL  . COPD (chronic obstructive pulmonary disease) ([place])    SMOKER  . Depression   . Dyspnea   . Emphysema of lung ([place])   . Full dentures   . Hemorrhoids   . On home oxygen therapy    "3L; 24/7" (05/29/2017)  . Oxygen deficiency   . Pneumonia ~ 2016   "double pneumonia"  . Prostate cancer ([place])    Gleason score = 7, supposed to have radiation therapy but he has not followed up (05/29/2017)  . Rib fractures    hx of    Past Surgical History:  Procedure Laterality Date  . COLON SURGERY  04/11/11   Sigmoid colectomy  . COLOSTOMY REVISION  04/11/2011   Procedure: COLON RESECTION SIGMOID;  Surgeon: [name],  MD;  Location: [facility];  Service: General;  Laterality: N/A;  low anterior colon resection   . GOLD SEED IMPLANT N/A 02/24/2018   Procedure: GOLD SEED IMPLANT;  Surgeon: [name], MD;  Location: [facility];  Service: Urology;  Laterality: N/A;  . HERNIA REPAIR    . INGUINAL HERNIA REPAIR Right 05/01/2012   Procedure: HERNIA REPAIR INGUINAL ADULT;  Surgeon: [name], MD;  Location: [facility];  Service: General;  Laterality: Right;  . INSERTION OF MESH Right 05/01/2012   Procedure: INSERTION OF MESH;  Surgeon: [name], MD;  Location: [facility];  Service: General;  Laterality: Right;  . PORT-A-CATH REMOVAL Left 05/01/2012   Procedure: REMOVAL Infusion Port;  Surgeon: [name], MD;  Location: [facility];  Service: General;  Laterality: Left;  . PORTACATH PLACEMENT  05/02/2011   Procedure: INSERTION PORT-A-CATH;  Surgeon: [name], MD;  Location: [facility];  Service: General;  Laterality: N/A;  . PROSTATE BIOPSY    . SPACE OAR INSTILLATION N/A 02/24/2018   Procedure: SPACE OAR INSTILLATION;  Surgeon: [name], MD;  Location: [facility];  Service: Urology;  Laterality: N/A;     reports that he quit smoking about a year ago. His smoking use included cigarettes. He has a 4.60 pack-year smoking history. He has quit using smokeless tobacco.  His smokeless tobacco use included chew. He  reports previous alcohol use. He reports that he does not use drugs.  Allergies  Allergen Reactions  . Codeine Nausea And Vomiting    Family History  Problem Relation Age of Onset  . Heart disease Father   . Heart failure Mother   . Heart disease Mother   . Lung cancer Maternal Uncle        smoked    Prior to Admission medications   Medication Sig Start Date End Date Taking? Authorizing Provider  albuterol (PROVENTIL HFA) 108 (90 Base) MCG/ACT inhaler Inhale 2 puffs into the lungs every 6 (six) hours as needed for wheezing or shortness of breath.    [provider]  albuterol (PROVENTIL) (2.5 MG/3ML)  0.083% nebulizer solution Take 3 mLs (2.5 mg total) by nebulization every 4 (four) hours as needed for wheezing or shortness of breath (if you can't catch your breath). 04/23/18   Harris, Abigail, PA-C  ARIPiprazole (ABILIFY) 5 MG tablet Take 1 tablet (5 mg total) by mouth daily. 04/29/18   Ghimire, Henreitta Leber, MD  Blood Glucose Monitoring Suppl (ACCU-CHEK AVIVA) device Use as instructed 10/27/17 10/27/18  Clent Demark, PA-C  escitalopram (LEXAPRO) 20 MG tablet Take 1 tablet (20 mg total) by mouth daily. 04/29/18   Ghimire, Henreitta Leber, MD  glucose blood (ACCU-CHEK AVIVA) test strip 1 each by Other route as needed for other. Use as instructed 10/27/17   Clent Demark, PA-C  Lancets [facility]) lancets 1 each by Other route 2 (two) times daily. Use as instructed 10/27/17   Clent Demark, PA-C  loperamide (IMODIUM) 2 MG capsule Take 2 every 6 hours if continued diarrhea Patient taking differently: Take 2-4 mg by mouth See admin instructions. Take 2 mg by mouth as needed for diarrhea or loose stools and 4 mg every 6 hours if diarrhea continues 04/10/18   [name], MD  lovastatin (MEVACOR) 20 MG tablet Take 1 tablet (20 mg total) by mouth at bedtime. 10/27/17   Clent Demark, PA-C  metFORMIN (GLUCOPHAGE) 1000 MG tablet Take 1 tablet (1,000 mg total) by mouth 2 (two) times daily with a meal. 10/27/17   Clent Demark, PA-C  metoprolol tartrate (LOPRESSOR) 25 MG tablet Take 0.5 tablets (12.5 mg total) by mouth 2 (two) times daily for 60 doses. 04/29/18 05/29/18  Ghimire, Henreitta Leber, MD  mometasone-formoterol (DULERA) 100-5 MCG/ACT AERO Inhale 2 puffs into the lungs 2 (two) times daily. 04/23/18   Harris, Vernie Shanks, PA-C  OXYGEN Inhale 3 L into the lungs continuous.     [provider]  tamsulosin (FLOMAX) 0.4 MG CAPS capsule Take 2 capsules (0.8 mg total) by mouth daily for 30 days. 05/04/18 06/03/18  Elodia Florence., MD  tiotropium (SPIRIVA) 18 MCG inhalation capsule Place 1  capsule (18 mcg total) into inhaler and inhale daily. 04/06/18   [name], MD    Physical Exam: Vitals:   05/25/18 2130 05/25/18 2145 05/25/18 2200 05/25/18 2215  BP: (!) 96/59 (!) 112/59 110/70 114/69  Pulse: 87 88 95 (!) 117  Resp: (!) 25 (!) 31 (!) 29 (!) 35  Temp:      TempSrc:      SpO2: 98% 99% 99% 100%  Weight:      Height:          Constitutional: Moderately built and nourished. Vitals:   05/25/18 2130 05/25/18 2145 05/25/18 2200 05/25/18 2215  BP: (!) 96/59 (!) 112/59 110/70 114/69  Pulse: 87 88 95 Marland Kitchen)  117  Resp: (!) 25 (!) 31 (!) 29 (!) 35  Temp:      TempSrc:      SpO2: 98% 99% 99% 100%  Weight:      Height:       Eyes: Anicteric no pallor. ENMT: No discharge from the ears eyes nose and mouth. Neck: No mass felt.  No neck rigidity but no JVD appreciated. Respiratory: Mild expiratory wheeze and no crepitations. Cardiovascular: S1-S2 heard. Abdomen: Soft nontender bowel sounds present. Musculoskeletal: No edema. Skin: No rash. Neurologic: Alert awake oriented to time place and person.  Moves all extremities. Psychiatric: Appears normal.  Normal affect.   Labs on Admission: I have personally reviewed following labs and imaging studies  CBC: Recent Labs  Lab 05/25/18 2021  WBC 6.6  NEUTROABS 5.2  HGB 13.1  HCT 40.8  MCV 96.5  PLT 242   Basic Metabolic Panel: Recent Labs  Lab 05/25/18 2021  NA 130*  K 4.8  CL 83*  CO2 36*  GLUCOSE 162*  BUN 10  CREATININE 0.66  CALCIUM 8.6*   GFR: Estimated Creatinine Clearance: 100.7 mL/min (by C-G formula based on SCr of 0.66 mg/dL). Liver Function Tests: Recent Labs  Lab 05/25/18 2021  AST 20  ALT 17  ALKPHOS 48  BILITOT 0.6  PROT 6.3*  ALBUMIN 4.3   No results for input(s): LIPASE, AMYLASE in the last 168 hours. No results for input(s): AMMONIA in the last 168 hours. Coagulation Profile: No results for input(s): INR, PROTIME in the last 168 hours. Cardiac Enzymes: No results for  input(s): CKTOTAL, CKMB, CKMBINDEX, TROPONINI in the last 168 hours. BNP (last 3 results) No results for input(s): PROBNP in the last 8760 hours. HbA1C: No results for input(s): HGBA1C in the last 72 hours. CBG: No results for input(s): GLUCAP in the last 168 hours. Lipid Profile: No results for input(s): CHOL, HDL, LDLCALC, TRIG, CHOLHDL, LDLDIRECT in the last 72 hours. Thyroid Function Tests: No results for input(s): TSH, T4TOTAL, FREET4, T3FREE, THYROIDAB in the last 72 hours. Anemia Panel: No results for input(s): VITAMINB12, FOLATE, FERRITIN, TIBC, IRON, RETICCTPCT in the last 72 hours. Urine analysis:    Component Value Date/Time   COLORURINE STRAW (A) 05/15/2018 1600   APPEARANCEUR CLEAR 05/15/2018 1600   LABSPEC >1.046 (H) 05/15/2018 1600   PHURINE 6.0 05/15/2018 1600   GLUCOSEU NEGATIVE 05/15/2018 1600   HGBUR NEGATIVE 05/15/2018 1600   BILIRUBINUR NEGATIVE 05/15/2018 1600   KETONESUR NEGATIVE 05/15/2018 1600   PROTEINUR NEGATIVE 05/15/2018 1600   UROBILINOGEN 1.0 01/09/2013 1843   NITRITE NEGATIVE 05/15/2018 1600   LEUKOCYTESUR NEGATIVE 05/15/2018 1600   Sepsis Labs: @LABRCNTIP (procalcitonin:4,lacticidven:4) ) Recent Results (from the past 240 hour(s))  SARS Coronavirus 2 (CEPHEID- Performed in [place] hospital lab), Hosp Order     Status: None   Collection Time: 05/25/18  8:22 PM  Result Value Ref Range Status   SARS Coronavirus 2 NEGATIVE NEGATIVE Final    Comment: (NOTE) If result is NEGATIVE SARS-CoV-2 target nucleic acids are NOT DETECTED. The SARS-CoV-2 RNA is generally detectable in upper and lower  respiratory specimens during the acute phase of infection. The lowest  concentration of SARS-CoV-2 viral copies this assay can detect is 250  copies / mL. A negative result does not preclude SARS-CoV-2 infection  and should not be used as the sole basis for treatment or other  patient management decisions.  A negative result may occur with  improper  specimen collection / handling, submission of specimen  other  than nasopharyngeal swab, presence of viral mutation(s) within the  areas targeted by this assay, and inadequate number of viral copies  (<250 copies / mL). A negative result must be combined with clinical  observations, patient history, and epidemiological information. If result is POSITIVE SARS-CoV-2 target nucleic acids are DETECTED. The SARS-CoV-2 RNA is generally detectable in upper and lower  respiratory specimens dur ing the acute phase of infection.  Positive  results are indicative of active infection with SARS-CoV-2.  Clinical  correlation with patient history and other diagnostic information is  necessary to determine patient infection status.  Positive results do  not rule out bacterial infection or co-infection with other viruses. If result is PRESUMPTIVE POSTIVE SARS-CoV-2 nucleic acids MAY BE PRESENT.   A presumptive positive result was obtained on the submitted specimen  and confirmed on repeat testing.  While 2019 novel coronavirus  (SARS-CoV-2) nucleic acids may be present in the submitted sample  additional confirmatory testing may be necessary for epidemiological  and / or clinical management purposes  to differentiate between  SARS-CoV-2 and other Sarbecovirus currently known to infect humans.  If clinically indicated additional testing with an alternate test  methodology [phone]) is advised. The SARS-CoV-2 RNA is generally  detectable in upper and lower respiratory sp ecimens during the acute  phase of infection. The expected result is Negative. Fact Sheet for Patients:  StrictlyIdeas.no Fact Sheet for Healthcare Providers: BankingDealers.co.za This test is not yet approved or cleared by the [place] FDA and has been authorized for detection and/or diagnosis of SARS-CoV-2 by FDA under an Emergency Use Authorization (EUA).  This EUA will remain in  effect (meaning this test can be used) for the duration of the COVID-19 declaration under Section 564(b)(1) of the Act, 21 U.S.C. section 360bbb-3(b)(1), unless the authorization is terminated or revoked sooner. Performed at [facility], [place] [address], [place], [place] [address]      Radiological Exams on Admission: Dg Chest Port 1 View  Result Date: 05/25/2018 CLINICAL DATA:  Respiratory distress, history colon cancer, asthma, COPD, prostate cancer, former smoker, hypertension EXAM: PORTABLE CHEST 1 VIEW COMPARISON:  Portable exam 2112 hours compared to 05/01/2018 FINDINGS: Normal heart size, mediastinal contours, and pulmonary vascularity. Emphysematous and bronchitic changes consistent with COPD. No acute infiltrate, pleural effusion, or pneumothorax. Multiple old upper RIGHT rib fractures. No acute osseous findings. IMPRESSION: COPD changes without acute abnormalities. Electronically Signed   By: Lavonia Dana M.D.   On: 05/25/2018 21:25    EKG: Independently reviewed.  Sinus tachycardia.  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)   COPD with acute exacerbation ([place])   Prostate cancer ([place])   DNR (do not resuscitate)    1. Acute respiratory failure with hypoxia secondary to COPD exacerbation for which patient is placed on albuterol inhaler continue with patient's Dulera and Spiriva.  I have placed patient on IV Solu-Medrol.   2. Chest pain which was brief episode yesterday.  Has resolved.  We will cycle cardiac markers. 3. History of urinary retention for which patient was placed on Foley catheter during last admission.  Patient wants to try to take the catheter off. 4. 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  We will keep patient on sliding scale coverage. 5. Hypertension on metoprolol. 6. Bipolar disorder on Abilify and Lexapro. 7. History of prostate cancer.   DVT  prophylaxis: Lovenox. Code Status: Full code. Family Communication: Discussed  with patient. Disposition Plan: Home. Consults called: None. Admission status: Observation.   Rise Patience MD [facility] Pager [phone].  If 7PM-7AM, please contact night-coverage [email or web address] Password [email or web address]  05/25/2018, 11:05 PM

## 2018-05-25 NOTE — ED Triage Notes (Addendum)
Pt called EMS from home c/o increasing SOB.. Typically on #L @ home when EMS arrived he was satting in the 80's on 3L.  Put on NRB which put up to 100%.  125 Solumedrol and 2 G mag given en route. Indwelling foley in place on arrival. Hstry of CA.

## 2018-05-25 NOTE — ED Notes (Signed)
Called to give report to floor.. Did not answer will try later.

## 2018-05-25 NOTE — ED Notes (Addendum)
Placed on his normal home 3L,  tolerating well

## 2018-05-26 ENCOUNTER — Ambulatory Visit: Payer: No Typology Code available for payment source

## 2018-05-26 ENCOUNTER — Encounter (HOSPITAL_COMMUNITY): Payer: Self-pay

## 2018-05-26 DIAGNOSIS — C61 Malignant neoplasm of prostate: Secondary | ICD-10-CM

## 2018-05-26 DIAGNOSIS — R338 Other retention of urine: Secondary | ICD-10-CM | POA: Diagnosis not present

## 2018-05-26 DIAGNOSIS — J9621 Acute and chronic respiratory failure with hypoxia: Secondary | ICD-10-CM | POA: Diagnosis present

## 2018-05-26 DIAGNOSIS — F1410 Cocaine abuse, uncomplicated: Secondary | ICD-10-CM

## 2018-05-26 DIAGNOSIS — Z7984 Long term (current) use of oral hypoglycemic drugs: Secondary | ICD-10-CM | POA: Diagnosis not present

## 2018-05-26 DIAGNOSIS — J9601 Acute respiratory failure with hypoxia: Secondary | ICD-10-CM | POA: Diagnosis not present

## 2018-05-26 DIAGNOSIS — Z8249 Family history of ischemic heart disease and other diseases of the circulatory system: Secondary | ICD-10-CM | POA: Diagnosis not present

## 2018-05-26 DIAGNOSIS — Z9981 Dependence on supplemental oxygen: Secondary | ICD-10-CM | POA: Diagnosis not present

## 2018-05-26 DIAGNOSIS — Z87891 Personal history of nicotine dependence: Secondary | ICD-10-CM | POA: Diagnosis not present

## 2018-05-26 DIAGNOSIS — J441 Chronic obstructive pulmonary disease with (acute) exacerbation: Secondary | ICD-10-CM | POA: Diagnosis present

## 2018-05-26 DIAGNOSIS — Z20828 Contact with and (suspected) exposure to other viral communicable diseases: Secondary | ICD-10-CM | POA: Diagnosis present

## 2018-05-26 DIAGNOSIS — I10 Essential (primary) hypertension: Secondary | ICD-10-CM | POA: Diagnosis present

## 2018-05-26 DIAGNOSIS — E099 Drug or chemical induced diabetes mellitus without complications: Secondary | ICD-10-CM | POA: Diagnosis present

## 2018-05-26 DIAGNOSIS — Z8701 Personal history of pneumonia (recurrent): Secondary | ICD-10-CM | POA: Diagnosis not present

## 2018-05-26 DIAGNOSIS — T380X5A Adverse effect of glucocorticoids and synthetic analogues, initial encounter: Secondary | ICD-10-CM | POA: Diagnosis present

## 2018-05-26 DIAGNOSIS — Z885 Allergy status to narcotic agent status: Secondary | ICD-10-CM | POA: Diagnosis not present

## 2018-05-26 DIAGNOSIS — Z801 Family history of malignant neoplasm of trachea, bronchus and lung: Secondary | ICD-10-CM | POA: Diagnosis not present

## 2018-05-26 DIAGNOSIS — F319 Bipolar disorder, unspecified: Secondary | ICD-10-CM | POA: Diagnosis present

## 2018-05-26 DIAGNOSIS — F419 Anxiety disorder, unspecified: Secondary | ICD-10-CM | POA: Diagnosis present

## 2018-05-26 DIAGNOSIS — Z85038 Personal history of other malignant neoplasm of large intestine: Secondary | ICD-10-CM | POA: Diagnosis not present

## 2018-05-26 DIAGNOSIS — K649 Unspecified hemorrhoids: Secondary | ICD-10-CM | POA: Diagnosis present

## 2018-05-26 DIAGNOSIS — R339 Retention of urine, unspecified: Secondary | ICD-10-CM | POA: Diagnosis present

## 2018-05-26 DIAGNOSIS — Z9114 Patient's other noncompliance with medication regimen: Secondary | ICD-10-CM | POA: Diagnosis not present

## 2018-05-26 DIAGNOSIS — Z79899 Other long term (current) drug therapy: Secondary | ICD-10-CM | POA: Diagnosis not present

## 2018-05-26 DIAGNOSIS — Z66 Do not resuscitate: Secondary | ICD-10-CM | POA: Diagnosis present

## 2018-05-26 LAB — BASIC METABOLIC PANEL
Anion gap: 14 (ref 5–15)
BUN: 8 mg/dL (ref 8–23)
CO2: 35 mmol/L — ABNORMAL HIGH (ref 22–32)
Calcium: 8.6 mg/dL — ABNORMAL LOW (ref 8.9–10.3)
Chloride: 84 mmol/L — ABNORMAL LOW (ref 98–111)
Creatinine, Ser: 0.72 mg/dL (ref 0.61–1.24)
GFR calc Af Amer: >60 mL/min (ref >60)
GFR calc non Af Amer: >60 mL/min (ref >60)
Glucose, Bld: 128 mg/dL — ABNORMAL HIGH (ref 70–99)
Potassium: 4.9 mmol/L (ref 3.5–5.1)
Sodium: 133 mmol/L — ABNORMAL LOW (ref 135–145)

## 2018-05-26 LAB — GLUCOSE, CAPILLARY
Glucose-Capillary: 138 mg/dL — ABNORMAL HIGH (ref 70–99)
Glucose-Capillary: 277 mg/dL — ABNORMAL HIGH (ref 70–99)
Glucose-Capillary: 193 mg/dL — ABNORMAL HIGH (ref 70–99)
Glucose-Capillary: 99 mg/dL (ref 70–99)
Glucose-Capillary: 187 mg/dL — ABNORMAL HIGH (ref 70–99)

## 2018-05-26 LAB — CBC
HCT: 42 % (ref 39.0–52.0)
Hemoglobin: 13.6 g/dL (ref 13.0–17.0)
MCH: 31.1 pg (ref 26.0–34.0)
MCHC: 32.4 g/dL (ref 30.0–36.0)
MCV: 95.9 fL (ref 80.0–100.0)
Platelets: 269 10*3/uL (ref 150–400)
RBC: 4.38 MIL/uL (ref 4.22–5.81)
RDW: 13 % (ref 11.5–15.5)
WBC: 2.6 10*3/uL — ABNORMAL LOW (ref 4.0–10.5)
nRBC: 0 % (ref 0.0–0.2)

## 2018-05-26 LAB — TROPONIN I
Troponin I: <0.03 ng/mL (ref <0.03)
Troponin I: <0.03 ng/mL (ref <0.03)
Troponin I: <0.03 ng/mL (ref <0.03)

## 2018-05-26 LAB — RAPID URINE DRUG SCREEN, HOSP PERFORMED
Amphetamines: NOT DETECTED
Barbiturates: NOT DETECTED
Benzodiazepines: NOT DETECTED
Cocaine: POSITIVE — AB
Opiates: NOT DETECTED
Tetrahydrocannabinol: NOT DETECTED

## 2018-05-26 MED ORDER — GUAIFENESIN ER 600 MG PO TB12
600.0000 mg | ORAL_TABLET | Freq: Two times a day (BID) | ORAL | Status: DC
  Administered 2018-05-26 – 2018-05-28 (×4): 600 mg via ORAL
  Filled 2018-05-26 (×4): qty 1

## 2018-05-26 MED ORDER — CLONAZEPAM 0.25 MG PO TBDP
0.2500 mg | ORAL_TABLET | Freq: Two times a day (BID) | ORAL | Status: DC | PRN
  Administered 2018-05-26 – 2018-05-28 (×5): 0.25 mg via ORAL
  Filled 2018-05-26 (×5): qty 1

## 2018-05-26 MED ORDER — NICOTINE 14 MG/24HR TD PT24
14.0000 mg | MEDICATED_PATCH | Freq: Every day | TRANSDERMAL | Status: DC
  Administered 2018-05-26 – 2018-05-28 (×3): 14 mg via TRANSDERMAL
  Filled 2018-05-26 (×3): qty 1

## 2018-05-26 MED ORDER — CARVEDILOL 3.125 MG PO TABS
3.1250 mg | ORAL_TABLET | Freq: Two times a day (BID) | ORAL | Status: DC
  Administered 2018-05-27 – 2018-05-28 (×3): 3.125 mg via ORAL
  Filled 2018-05-26 (×3): qty 1

## 2018-05-26 NOTE — Progress Notes (Signed)
Inpatient Diabetes Program Recommendations  AACE/ADA: New Consensus Statement on Inpatient Glycemic Control (2015)  Target Ranges:  Prepandial:   less than 140 mg/dL      Peak postprandial:   less than 180 mg/dL (1-2 hours)      Critically ill patients:  140 - 180 mg/dL   Lab Results  Component Value Date   GLUCAP 277 (H) 05/26/2018   HGBA1C 7.7 (H) 02/19/2018    Review of Glycemic Control Results for Taylor [name], Taylor [name] (MRN [id number]) as of 05/26/2018 10:07  Ref. Range 05/26/2018 06:17 05/26/2018 08:15  Glucose-Capillary Latest Ref Range: 70 - 99 mg/dL 138 (H) 277 (H)   Diabetes history: Type 2 DM Outpatient Diabetes medications: Metformin 1000 mg BID Current orders for Inpatient glycemic control: Novolog 0-9 units TID  Solumedrol 40 mg BID  Inpatient Diabetes Program Recommendations:    In the setting of steroids, anticipate glucose trends to increase.   Consider increase correction scale to Moderate: Novolog 0-15 units TID and adding Novolog 0-5 units QHS.   Also, noted last A1C was from 01/2018. Consider repeating A1C.   Will continue to follow.  Thanks, Bronson Curb, MSN, RNC-OB Diabetes Coordinator [phone] (8a-5p)

## 2018-05-26 NOTE — Progress Notes (Signed)
pt c/o having a hard time breathing. All vs stable. Possible panic attack. Pt was given some methods for meditation and relaxation. Pt request anxiety medication. MD paged.

## 2018-05-26 NOTE — Progress Notes (Signed)
PROGRESS NOTE  Taylor [name] VEL:[id number] DOB: [birth year] DOA: 05/25/2018 PCP: Clent Demark, PA-C  HPI/Recap of past 24 hours: C/o sob, congested cough  Assessment/Plan: Principal Problem:   Acute respiratory failure with hypoxia (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  Patient wants to try to take the catheter off. He reports has a follow up appointment with urology Dr Jeffie Pollock tomorrow for voiding trial Continue flomax, ambulate, voding trial prior to discharge  Hypertension , d/c metoprolol., change to coreg due to cocaine use  Bipolar disorder, stable on Abilify and Lexapro. . Cocaine use  Code Status: full  Family Communication: patient   Disposition Plan: home in 1-2 days, pending clinical improvement   Consultants:  none  Procedures:  none  Antibiotics:  none   Objective: BP 105/69 (BP Location: Left Arm)   Pulse 97   Temp 98.4 F (36.9 C) (Oral)   Resp 19   Ht 5\' 11"  (1.803 m)   Wt 67.9 kg   SpO2 94%   BMI 20.88 kg/m   Intake/Output Summary (Last 24 hours) at 05/26/2018 1337 Last data filed at 05/26/2018 0532 Gross per 24 hour  Intake -  Output 1250 ml  Net -1250 ml   Filed Weights   05/25/18 2024 05/26/18 0000  Weight: 75.8 kg 67.9 kg    Exam: Patient is examined daily including today on 05/26/2018, exams remain the same as of yesterday except that has changed    General:  NAD  Cardiovascular: RRR  Respiratory: diffuse bilateral wheezing  Abdomen:  Soft/ND/NT, positive BS, + foley  Musculoskeletal: No Edema  Neuro: alert, oriented   Data Reviewed: Basic Metabolic Panel: Recent Labs  Lab 05/25/18 2021 05/26/18 0644  NA 130* 133*  K 4.8 4.9  CL 83* 84*  CO2 36* 35*  GLUCOSE 162* 128*  BUN 10 8  CREATININE 0.66 0.72  CALCIUM 8.6* 8.6*   Liver Function Tests: Recent Labs  Lab 05/25/18 2021  AST 20  ALT 17  ALKPHOS 48  BILITOT 0.6  PROT 6.3*  ALBUMIN 4.3   No results for input(s): LIPASE, AMYLASE in the last 168 hours. No results for input(s): AMMONIA in the last 168 hours. CBC: Recent Labs  Lab 05/25/18 2021 05/26/18 0644  WBC 6.6 2.6*  NEUTROABS 5.2  --   HGB 13.1 13.6  HCT 40.8 42.0  MCV 96.5 95.9  PLT 262 269   Cardiac Enzymes:   Recent Labs  Lab 05/26/18 0657  TROPONINI <0.03   BNP (last 3 results) Recent Labs    04/14/18 1718 05/01/18 2019 05/25/18 2021  BNP 33.1 45.6 81.3    ProBNP (last 3 results) No results for input(s): PROBNP in the last 8760 hours.  CBG: Recent Labs  Lab 05/26/18 0617 05/26/18 0815 05/26/18 1131  GLUCAP 138* 277* 193*    Recent Results (from the past 240 hour(s))  SARS Coronavirus 2 (CEPHEID- Performed in [place] hospital lab), Hosp Order     Status: None   Collection Time: 05/25/18  8:22 PM  Result  Value Ref Range Status   SARS Coronavirus 2 NEGATIVE NEGATIVE Final    Comment: (NOTE) If result is NEGATIVE SARS-CoV-2 target nucleic acids are NOT DETECTED. The SARS-CoV-2 RNA is generally detectable in upper and lower  respiratory specimens during the acute phase of infection. The lowest  concentration of SARS-CoV-2 viral copies this assay can detect is 250  copies / mL. A negative result does not preclude SARS-CoV-2 infection  and should not be used as the sole basis for treatment or other  patient management decisions.  A negative result may occur with  improper specimen collection / handling, submission of specimen other  than nasopharyngeal  swab, presence of viral mutation(s) within the  areas targeted by this assay, and inadequate number of viral copies  (<250 copies / mL). A negative result must be combined with clinical  observations, patient history, and epidemiological information. If result is POSITIVE SARS-CoV-2 target nucleic acids are DETECTED. The SARS-CoV-2 RNA is generally detectable in upper and lower  respiratory specimens dur ing the acute phase of infection.  Positive  results are indicative of active infection with SARS-CoV-2.  Clinical  correlation with patient history and other diagnostic information is  necessary to determine patient infection status.  Positive results do  not rule out bacterial infection or co-infection with other viruses. If result is PRESUMPTIVE POSTIVE SARS-CoV-2 nucleic acids MAY BE PRESENT.   A presumptive positive result was obtained on the submitted specimen  and confirmed on repeat testing.  While 2019 novel coronavirus  (SARS-CoV-2) nucleic acids may be present in the submitted sample  additional confirmatory testing may be necessary for epidemiological  and / or clinical management purposes  to differentiate between  SARS-CoV-2 and other Sarbecovirus currently known to infect humans.  If clinically indicated additional testing with an alternate test  methodology [phone]) is advised. The SARS-CoV-2 RNA is generally  detectable in upper and lower respiratory sp ecimens during the acute  phase of infection. The expected result is Negative. Fact Sheet for Patients:  StrictlyIdeas.no Fact Sheet for Healthcare Providers: BankingDealers.co.za This test is not yet approved or cleared by the [place] FDA and has been authorized for detection and/or diagnosis of SARS-CoV-2 by FDA under an Emergency Use Authorization (EUA).  This EUA will remain in effect (meaning this test can be used) for the duration of the COVID-19 declaration  under Section 564(b)(1) of the Act, 21 U.S.C. section 360bbb-3(b)(1), unless the authorization is terminated or revoked sooner. Performed at [facility], [place] [address], [place], [place] [address]      Studies: Dg Chest Port 1 View  Result Date: 05/25/2018 CLINICAL DATA:  Respiratory distress, history colon cancer, asthma, COPD, prostate cancer, former smoker, hypertension EXAM: PORTABLE CHEST 1 VIEW COMPARISON:  Portable exam 2112 hours compared to 05/01/2018 FINDINGS: Normal heart size, mediastinal contours, and pulmonary vascularity. Emphysematous and bronchitic changes consistent with COPD. No acute infiltrate, pleural effusion, or pneumothorax. Multiple old upper RIGHT rib fractures. No acute osseous findings. IMPRESSION: COPD changes without acute abnormalities. Electronically Signed   By: Lavonia Dana M.D.   On: 05/25/2018 21:25    Scheduled Meds: . albuterol  3 mL Inhalation Q4H  . ARIPiprazole  5 mg Oral Daily  . enoxaparin (LOVENOX) injection  40 mg Subcutaneous Q24H  . escitalopram  20 mg Oral Daily  . insulin aspart  0-9 Units Subcutaneous TID WC  . methylPREDNISolone (SOLU-MEDROL) injection  40 mg Intravenous Q12H  . metoprolol tartrate  12.5 mg Oral  BID  . mometasone-formoterol  2 puff Inhalation BID  . nicotine  14 mg Transdermal Daily  . pravastatin  20 mg Oral q1800  . tamsulosin  0.8 mg Oral Daily  . umeclidinium bromide  1 puff Inhalation Daily    Continuous Infusions:   Time spent: 83mins I have personally reviewed and interpreted on  05/26/2018 daily labs, tele strips, imagings as discussed above under date review session and assessment and plans.  I reviewed all nursing notes, pharmacy notes, consultant notes,  vitals, pertinent old records  I have discussed plan of care as described above with RN , patient  on 05/26/2018   Florencia Reasons MD, PhD  [facility] Pager [phone]. If 7PM-7AM, please contact night-coverage at [email or web address], password [email or web address]  05/26/2018, 1:37 PM  LOS: 0 days

## 2018-05-27 ENCOUNTER — Ambulatory Visit: Payer: No Typology Code available for payment source

## 2018-05-27 LAB — COMPREHENSIVE METABOLIC PANEL
ALT: 17 U/L (ref 0–44)
AST: 18 U/L (ref 15–41)
Albumin: 3.8 g/dL (ref 3.5–5.0)
Alkaline Phosphatase: 43 U/L (ref 38–126)
Anion gap: 10 (ref 5–15)
BUN: 13 mg/dL (ref 8–23)
CO2: 40 mmol/L — ABNORMAL HIGH (ref 22–32)
Calcium: 8.9 mg/dL (ref 8.9–10.3)
Chloride: 86 mmol/L — ABNORMAL LOW (ref 98–111)
Creatinine, Ser: 0.59 mg/dL — ABNORMAL LOW (ref 0.61–1.24)
GFR calc Af Amer: >60 mL/min (ref >60)
GFR calc non Af Amer: >60 mL/min (ref >60)
Glucose, Bld: 138 mg/dL — ABNORMAL HIGH (ref 70–99)
Potassium: 4.8 mmol/L (ref 3.5–5.1)
Sodium: 136 mmol/L (ref 135–145)
Total Bilirubin: 0.2 mg/dL — ABNORMAL LOW (ref 0.3–1.2)
Total Protein: 5.6 g/dL — ABNORMAL LOW (ref 6.5–8.1)

## 2018-05-27 LAB — CBC WITH DIFFERENTIAL/PLATELET
Abs Immature Granulocytes: 0.03 10*3/uL (ref 0.00–0.07)
Basophils Absolute: 0 10*3/uL (ref 0.0–0.1)
Basophils Relative: 0 %
Eosinophils Absolute: 0 10*3/uL (ref 0.0–0.5)
Eosinophils Relative: 0 %
HCT: 32.4 % — ABNORMAL LOW (ref 39.0–52.0)
Hemoglobin: 10.8 g/dL — ABNORMAL LOW (ref 13.0–17.0)
Immature Granulocytes: 1 %
Lymphocytes Relative: 8 %
Lymphs Abs: 0.4 10*3/uL — ABNORMAL LOW (ref 0.7–4.0)
MCH: 31.6 pg (ref 26.0–34.0)
MCHC: 33.3 g/dL (ref 30.0–36.0)
MCV: 94.7 fL (ref 80.0–100.0)
Monocytes Absolute: 0.5 10*3/uL (ref 0.1–1.0)
Monocytes Relative: 10 %
Neutro Abs: 4.4 10*3/uL (ref 1.7–7.7)
Neutrophils Relative %: 81 %
Platelets: 208 10*3/uL (ref 150–400)
RBC: 3.42 MIL/uL — ABNORMAL LOW (ref 4.22–5.81)
RDW: 12.8 % (ref 11.5–15.5)
WBC: 5.4 10*3/uL (ref 4.0–10.5)
nRBC: 0 % (ref 0.0–0.2)

## 2018-05-27 LAB — BASIC METABOLIC PANEL
Anion gap: 10 (ref 5–15)
BUN: 9 mg/dL (ref 8–23)
CO2: 38 mmol/L — ABNORMAL HIGH (ref 22–32)
Calcium: 8.6 mg/dL — ABNORMAL LOW (ref 8.9–10.3)
Chloride: 85 mmol/L — ABNORMAL LOW (ref 98–111)
Creatinine, Ser: 0.53 mg/dL — ABNORMAL LOW (ref 0.61–1.24)
GFR calc Af Amer: >60 mL/min (ref >60)
GFR calc non Af Amer: >60 mL/min (ref >60)
Glucose, Bld: 132 mg/dL — ABNORMAL HIGH (ref 70–99)
Potassium: 5 mmol/L (ref 3.5–5.1)
Sodium: 133 mmol/L — ABNORMAL LOW (ref 135–145)

## 2018-05-27 LAB — GLUCOSE, CAPILLARY
Glucose-Capillary: 217 mg/dL — ABNORMAL HIGH (ref 70–99)
Glucose-Capillary: 109 mg/dL — ABNORMAL HIGH (ref 70–99)
Glucose-Capillary: 96 mg/dL (ref 70–99)
Glucose-Capillary: 175 mg/dL — ABNORMAL HIGH (ref 70–99)

## 2018-05-27 LAB — MAGNESIUM: Magnesium: 2.2 mg/dL (ref 1.7–2.4)

## 2018-05-27 MED ORDER — FAMOTIDINE 20 MG PO TABS
20.0000 mg | ORAL_TABLET | Freq: Two times a day (BID) | ORAL | Status: DC
  Administered 2018-05-27 – 2018-05-28 (×3): 20 mg via ORAL
  Filled 2018-05-27 (×3): qty 1

## 2018-05-27 NOTE — Progress Notes (Signed)
PROGRESS NOTE    Taylor [name]  VOZ:[id number] DOB: Oct 28, 1955 DOA: 05/25/2018 PCP: Clent Demark, PA-C   Brief Narrative:  Taylor [name] is Taylor [name] 63 y.o. male with history of COPD with ongoing tobacco abuse, bipolar disorder, hypertension, diabetes mellitus who was admitted last month for COPD exacerbation presents to the ER because of worsening shortness of breath over the last 24 hours.  Patient states he has not been taking his medications for last 1 week as he has ran out.  Denies any fever chills or sick contacts.  Has had some brief episode of chest pain yesterday which lasted for few minutes which resolved.  Patient said he actually thought he was dehydrated.  But did not have any nausea vomiting or diarrhea.  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 Trial of void today, follow post voids Continue flomax Needs urology follow up  Hypertension d/c metoprolol., change to coreg due to cocaine use  Bipolar disorder, stable on Abilify and Lexapro. . Cocaine use - encourage cessation  DVT prophylaxis: lovenox Code Status: full  Family Communication: none at bedside Disposition Plan: pending further improvement, pt with persistent sob at rest   Consultants:   none  Procedures:   none  Antimicrobials:  Anti-infectives (From admission, onward)   None       Subjective: C/o anxiety and SOB  Objective: Vitals:   05/27/18 1201 05/27/18 1556 05/27/18 1613 05/27/18 1959  BP:   116/83   Pulse:   78   Resp:      Temp:   97.7 F (36.5 C)   TempSrc:   Oral   SpO2: 99% 99% 100% 96%  Weight:      Height:        Intake/Output Summary (Last 24 hours) at 05/27/2018 2026 Last data filed at 05/27/2018 1838 Gross per 24 hour  Intake 1260 ml  Output 3200 ml  Net -1940 ml   Filed Weights   05/25/18 2024 05/26/18 0000 05/27/18 0558  Weight: 75.8 kg 67.9 kg 67.5 kg    Examination:  General exam: Appears calm and comfortable  Respiratory system: faint wheezing, decreased air movement  Cardiovascular system: S1 & S2 heard, RRR. Gastrointestinal system: Abdomen is nondistended, soft and nontender.  Central nervous system: Alert and oriented. No focal neurological deficits. Extremities: Symmetric 5 x 5 power. Skin: No rashes, lesions or ulcers Psychiatry: apepars anxious     Data Reviewed: I have personally reviewed following labs and imaging studies  CBC: Recent Labs  Lab 05/25/18 2021 05/26/18 0644 05/27/18 0438  WBC 6.6 2.6* 5.4  NEUTROABS 5.2  --  4.4  HGB 13.1 13.6 10.8*  HCT 40.8 42.0 32.4*  MCV 96.5 95.9 94.7  PLT 262 269 425   Basic Metabolic Panel: Recent Labs  Lab 05/25/18 2021 05/26/18 0644 05/27/18 0438  NA 130* 133* 133*  K 4.8 4.9 5.0  CL 83* 84* 85*  CO2 36* 35* 38*  GLUCOSE 162* 128* 132*  BUN 10 8 9   CREATININE 0.66 0.72 0.53*  CALCIUM 8.6* 8.6* 8.6*  MG  --   --  2.2   GFR: Estimated Creatinine Clearance: 90.2 mL/min (Zamir Staples) (by C-G formula based on SCr of 0.53 mg/dL (L)). Liver Function Tests: Recent Labs  Lab 05/25/18 2021  AST 20  ALT 17  ALKPHOS 48  BILITOT 0.6  PROT 6.3*  ALBUMIN 4.3   No results for input(s): LIPASE, AMYLASE in the last 168 hours. No results for input(s): AMMONIA in the last 168 hours. Coagulation Profile: No results for input(s): INR, PROTIME in the last 168 hours.  Cardiac Enzymes: Recent Labs  Lab 05/26/18 0657 05/26/18 1234 05/26/18 1823  TROPONINI <0.03 <0.03 <0.03   BNP (last 3 results) No results for input(s): PROBNP in the last 8760 hours. HbA1C: No results for input(s): HGBA1C in the last 72 hours. CBG: Recent Labs  Lab 05/26/18 1631 05/26/18 2106 05/27/18 0732 05/27/18 1138 05/27/18 1612  GLUCAP 99 187* 217* 109* 96   Lipid Profile: No results for input(s): CHOL, HDL, LDLCALC, TRIG, CHOLHDL, LDLDIRECT in the last 72 hours. Thyroid Function Tests: No results for input(s): TSH, T4TOTAL, FREET4, T3FREE, THYROIDAB in the last 72 hours. Anemia Panel: No results for input(s): VITAMINB12, FOLATE, FERRITIN, TIBC, IRON, RETICCTPCT in the last 72 hours. Sepsis Labs: No results for input(s): PROCALCITON, LATICACIDVEN in the last 168 hours.  Recent Results (from the past 240 hour(s))  SARS Coronavirus 2 (CEPHEID- Performed in [place] hospital lab), Hosp Order     Status: None   Collection Time: 05/25/18  8:22 PM  Result Value Ref Range Status   SARS Coronavirus 2 NEGATIVE NEGATIVE Final    Comment: (NOTE) If result is NEGATIVE SARS-CoV-2 target nucleic acids are NOT DETECTED. The SARS-CoV-2 RNA is generally detectable in upper and lower  respiratory specimens during the acute phase of infection. The lowest  concentration of SARS-CoV-2 viral copies this assay can detect is 250  copies / mL. Shloma Roggenkamp negative result does not preclude SARS-CoV-2 infection  and should not be used as the sole basis for treatment or other  patient management decisions.  Darnise Montag negative result may occur with  improper specimen collection / handling, submission of specimen other  than nasopharyngeal swab, presence of viral mutation(s) within the  areas targeted by this assay, and inadequate number of viral copies  (<250 copies / mL). Adelai Achey negative result must be combined with clinical  observations, patient history, and epidemiological information. If result is  POSITIVE SARS-CoV-2 target nucleic acids are DETECTED. The SARS-CoV-2 RNA is generally detectable in upper and lower  respiratory specimens dur ing the acute phase of infection.  Positive  results are indicative of active infection with SARS-CoV-2.  Clinical  correlation with patient history and other diagnostic information is  necessary to determine patient infection status.  Positive results do  not rule out bacterial infection or co-infection with other viruses. If result is PRESUMPTIVE POSTIVE SARS-CoV-2 nucleic acids MAY BE PRESENT.   Kaidance Pantoja presumptive positive result was obtained on the submitted specimen  and confirmed on repeat testing.  While 2019 novel coronavirus  (SARS-CoV-2) nucleic acids may be present in the submitted sample  additional confirmatory testing may be necessary for epidemiological  and / or clinical management purposes  to differentiate between  SARS-CoV-2 and other Sarbecovirus currently known to infect humans.  If clinically indicated additional testing with an alternate  test  methodology [phone]) is advised. The SARS-CoV-2 RNA is generally  detectable in upper and lower respiratory sp ecimens during the acute  phase of infection. The expected result is Negative. Fact Sheet for Patients:  StrictlyIdeas.no Fact Sheet for Healthcare Providers: BankingDealers.co.za This test is not yet approved or cleared by the [place] FDA and has been authorized for detection and/or diagnosis of SARS-CoV-2 by FDA under an Emergency Use Authorization (EUA).  This EUA will remain in effect (meaning this test can be used) for the duration of the COVID-19 declaration under Section 564(b)(1) of the Act, 21 U.S.C. section 360bbb-3(b)(1), unless the authorization is terminated or revoked sooner. Performed at [facility], [place] [address], [place], [place] [address]          Radiology Studies: Dg Chest Port 1 View   Result Date: 05/25/2018 CLINICAL DATA:  Respiratory distress, history colon cancer, asthma, COPD, prostate cancer, former smoker, hypertension EXAM: PORTABLE CHEST 1 VIEW COMPARISON:  Portable exam 2112 hours compared to 05/01/2018 FINDINGS: Normal heart size, mediastinal contours, and pulmonary vascularity. Emphysematous and bronchitic changes consistent with COPD. No acute infiltrate, pleural effusion, or pneumothorax. Multiple old upper RIGHT rib fractures. No acute osseous findings. IMPRESSION: COPD changes without acute abnormalities. Electronically Signed   By: Lavonia Dana M.D.   On: 05/25/2018 21:25        Scheduled Meds: . albuterol  3 mL Inhalation Q4H  . ARIPiprazole  5 mg Oral Daily  . carvedilol  3.125 mg Oral BID WC  . enoxaparin (LOVENOX) injection  40 mg Subcutaneous Q24H  . escitalopram  20 mg Oral Daily  . famotidine  20 mg Oral BID  . guaiFENesin  600 mg Oral BID  . insulin aspart  0-9 Units Subcutaneous TID WC  . methylPREDNISolone (SOLU-MEDROL) injection  40 mg Intravenous Q12H  . mometasone-formoterol  2 puff Inhalation BID  . nicotine  14 mg Transdermal Daily  . pravastatin  20 mg Oral q1800  . tamsulosin  0.8 mg Oral Daily  . umeclidinium bromide  1 puff Inhalation Daily   Continuous Infusions:   LOS: 1 day    Time spent: over 30 min    Fayrene Helper, MD [facility] Pager AMION  If 7PM-7AM, please contact night-coverage [email or web address] Password [email or web address] 05/27/2018, 8:26 PM

## 2018-05-27 NOTE — Evaluation (Addendum)
Physical Therapy Evaluation Patient Details Name: Taylor [name] MRN: [id number] DOB: September 21, 1955 Today's Date: 05/27/2018   History of Present Illness  Pt is a 63 y/o male admitted secondary to SOB with COPD exacerbation. COVID 19 testing was negative. PMH including but not limited to Bipolar, COPD with ongoing tobacco abuse, hypertension, diabetes mellitus     Clinical Impression  Pt presented supine in bed with HOB elevated, awake and willing to participate in therapy session. Prior to admission, pt reported that he was independent with all functional mobility and ADLs. Pt lives with a friend in a single level home with a few steps to enter. At the time of evaluation, pt independent with bed mobility and transfers, he ambulated in room with no AD with min guard to supervision. Pt on 3L of O2 throughout with SPO2 maintaining >90% throughout. PT will continue to follow acutely to progress mobility as tolerated.     Follow Up Recommendations No PT follow up    Equipment Recommendations  None recommended by PT    Recommendations for Other Services       Precautions / Restrictions Precautions Precautions: None Precaution Comments: on 3L of O2 at baseline Restrictions Weight Bearing Restrictions: No      Mobility  Bed Mobility Overal bed mobility: Independent                Transfers Overall transfer level: Independent Equipment used: None                Ambulation/Gait Ambulation/Gait assistance: Supervision;Min guard Gait Distance (Feet): 40 Feet Assistive device: None Gait Pattern/deviations: WFL(Within Functional Limits) Gait velocity: decreased   General Gait Details: pt able to ambulate in room without use of an AD  Stairs            Wheelchair Mobility    Modified Rankin (Stroke Patients Only)       Balance Overall balance assessment: Needs assistance Sitting-balance support: Feet supported Sitting balance-Leahy Scale: Normal      Standing balance support: No upper extremity supported Standing balance-Leahy Scale: Good                               Pertinent Vitals/Pain Pain Assessment: No/denies pain    Home Living Family/patient expects to be discharged to:: Private residence Living Arrangements: Non-relatives/Friends Available Help at Discharge: Friend(s);Available PRN/intermittently Type of Home: House Home Access: Stairs to enter Entrance Stairs-Rails: Right Entrance Stairs-Number of Steps: 3 Home Layout: One level Home Equipment: Shower seat - built in      Prior Function Level of Independence: Independent               Hand Dominance        Extremity/Trunk Assessment   Upper Extremity Assessment Upper Extremity Assessment: Overall WFL for tasks assessed    Lower Extremity Assessment Lower Extremity Assessment: Overall WFL for tasks assessed    Cervical / Trunk Assessment Cervical / Trunk Assessment: Normal  Communication   Communication: No difficulties  Cognition Arousal/Alertness: Awake/alert Behavior During Therapy: WFL for tasks assessed/performed Overall Cognitive Status: Within Functional Limits for tasks assessed                                        General Comments      Exercises     Assessment/Plan    PT  Assessment Patient needs continued PT services  PT Problem List Cardiopulmonary status limiting activity       PT Treatment Interventions DME instruction;Stair training;Functional mobility training;Therapeutic activities;Gait training;Therapeutic exercise;Balance training;Neuromuscular re-education;Patient/family education    PT Goals (Current goals can be found in the Care Plan section)  Acute Rehab PT Goals Patient Stated Goal: "go home" PT Goal Formulation: With patient Time For Goal Achievement: 06/10/18 Potential to Achieve Goals: Good    Frequency Min 3X/week   Barriers to discharge        Co-evaluation                AM-PAC PT "6 Clicks" Mobility  Outcome Measure Help needed turning from your back to your side while in a flat bed without using bedrails?: None Help needed moving from lying on your back to sitting on the side of a flat bed without using bedrails?: None Help needed moving to and from a bed to a chair (including a wheelchair)?: None Help needed standing up from a chair using your arms (e.g., wheelchair or bedside chair)?: None Help needed to walk in hospital room?: A Little Help needed climbing 3-5 steps with a railing? : A Little 6 Click Score: 22    End of Session Equipment Utilized During Treatment: Oxygen Activity Tolerance: Patient tolerated treatment well Patient left: in bed;with call bell/phone within reach Nurse Communication: Mobility status PT Visit Diagnosis: Muscle weakness (generalized) (M62.81)    Time: 7564-3329 PT Time Calculation (min) (ACUTE ONLY): 10 min   Charges:   PT Evaluation $PT Eval Low Complexity: [place], PT, DPT  Acute Rehabilitation Services Pager [phone] Office [place] 05/27/2018, 1:29 PM

## 2018-05-28 ENCOUNTER — Ambulatory Visit: Payer: No Typology Code available for payment source

## 2018-05-28 ENCOUNTER — Ambulatory Visit: Admission: RE | Admit: 2018-05-28 | Payer: No Typology Code available for payment source | Source: Ambulatory Visit

## 2018-05-28 LAB — CBC
HCT: 33.8 % — ABNORMAL LOW (ref 39.0–52.0)
Hemoglobin: 11 g/dL — ABNORMAL LOW (ref 13.0–17.0)
MCH: 31.4 pg (ref 26.0–34.0)
MCHC: 32.5 g/dL (ref 30.0–36.0)
MCV: 96.6 fL (ref 80.0–100.0)
Platelets: 217 10*3/uL (ref 150–400)
RBC: 3.5 MIL/uL — ABNORMAL LOW (ref 4.22–5.81)
RDW: 13.1 % (ref 11.5–15.5)
WBC: 4.9 10*3/uL (ref 4.0–10.5)
nRBC: 0 % (ref 0.0–0.2)

## 2018-05-28 LAB — GLUCOSE, CAPILLARY
Glucose-Capillary: 163 mg/dL — ABNORMAL HIGH (ref 70–99)
Glucose-Capillary: 252 mg/dL — ABNORMAL HIGH (ref 70–99)

## 2018-05-28 LAB — MAGNESIUM: Magnesium: 2.3 mg/dL (ref 1.7–2.4)

## 2018-05-28 MED ORDER — ARIPIPRAZOLE 5 MG PO TABS: 5.0000 mg | ORAL_TABLET | Freq: Every day | ORAL | 0 refills | Status: AC

## 2018-05-28 MED ORDER — PREDNISONE 10 MG PO TABS: ORAL_TABLET | ORAL | 0 refills | Status: AC

## 2018-05-28 MED ORDER — ALBUTEROL SULFATE (2.5 MG/3ML) 0.083% IN NEBU: 2.5000 mg | INHALATION_SOLUTION | RESPIRATORY_TRACT | 1 refills | Status: DC | PRN

## 2018-05-28 MED ORDER — MOMETASONE FURO-FORMOTEROL FUM 100-5 MCG/ACT IN AERO: 2.0000 | INHALATION_SPRAY | Freq: Two times a day (BID) | RESPIRATORY_TRACT | 1 refills | Status: DC

## 2018-05-28 MED ORDER — ALBUTEROL SULFATE HFA 108 (90 BASE) MCG/ACT IN AERS: 2.0000 | INHALATION_SPRAY | Freq: Four times a day (QID) | RESPIRATORY_TRACT | 1 refills | Status: DC | PRN

## 2018-05-28 MED ORDER — ESCITALOPRAM OXALATE 20 MG PO TABS: 20.0000 mg | ORAL_TABLET | Freq: Every day | ORAL | 0 refills | Status: AC

## 2018-05-28 MED ORDER — LOVASTATIN 20 MG PO TABS: 20.0000 mg | ORAL_TABLET | Freq: Every day | ORAL | 0 refills | Status: AC

## 2018-05-28 MED ORDER — NICOTINE 14 MG/24HR TD PT24: 14.0000 mg | MEDICATED_PATCH | Freq: Every day | TRANSDERMAL | 0 refills | Status: DC

## 2018-05-28 MED ORDER — CARVEDILOL 3.125 MG PO TABS: 3.1250 mg | ORAL_TABLET | Freq: Two times a day (BID) | ORAL | 0 refills | Status: DC

## 2018-05-28 NOTE — TOC Transition Note (Addendum)
Transition of Care [facility]) - CM/SW Discharge Note   Patient Details  Name: Taylor [name] MRN: [id number] Date of Birth: 1955-05-18  Transition of Care [facility]) CM/SW Contact:  [name], RN Phone Number: 05/28/2018, 3:06 PM   Clinical Narrative:    From home at friends house [place], [place] [address],  Phone [phone](cell).  Patient for dc today. He states he will need ambulance transport to go home with, his oxygen is at his friends home where he is going.  Forms on chart for ambulance, [facility]AR notified [phone].    Final next level of care: Home/Self Care Barriers to Discharge: No Barriers Identified   Patient Goals and CMS Choice Patient states their goals for this hospitalization and ongoing recovery are:: to go home   Choice offered to / list presented to : NA  Discharge Placement                       Discharge Plan and Services In-house Referral: NA Discharge Planning Services: CM Consult Post Acute Care Choice: NA          DME Arranged: N/A DME Agency: NA                  Social Determinants of Health (SDOH) Interventions     Readmission Risk Interventions Readmission Risk Prevention Plan 05/27/2018 05/03/2018 04/27/2018  Transportation Screening Complete - Complete  Medication Review Press photographer) Complete - Complete  PCP or Specialist appointment within 3-5 days of discharge Complete - Complete  HRI or [place] - Patient refused Complete  SW Recovery Care/Counseling Consult Not Complete - Not Complete  SW Consult Not Complete Comments - - NA  Palliative Care Screening Not Applicable - Not [place] Not Applicable - Not Applicable  Some recent data might be hidden

## 2018-05-28 NOTE — Progress Notes (Signed)
Physical Therapy Treatment Patient Details Name: Taylor [name] MRN: [id number] DOB: 10-26-1955 Today's Date: 05/28/2018    History of Present Illness Pt is a 63 y/o male admitted secondary to SOB with COPD exacerbation. COVID 19 testing was negative. PMH including but not limited to Bipolar, COPD with ongoing tobacco abuse, hypertension, diabetes mellitus     PT Comments    Pt making steady progress with functional mobility. He remains limited secondary to SOB and DOE. Pt on 3L of O2 throughout with SPO2 maintaining >93% with all activity. PT will continue to follow acutely to progress mobility as tolerated.     Follow Up Recommendations  No PT follow up     Equipment Recommendations  None recommended by PT    Recommendations for Other Services       Precautions / Restrictions Precautions Precautions: None Precaution Comments: on 3L of O2 at baseline Restrictions Weight Bearing Restrictions: No    Mobility  Bed Mobility Overal bed mobility: Independent                Transfers Overall transfer level: Independent Equipment used: None                Ambulation/Gait Ambulation/Gait assistance: Supervision Gait Distance (Feet): 50 Feet(50' x2 with sitting rest break) Assistive device: None Gait Pattern/deviations: WFL(Within Functional Limits) Gait velocity: decreased   General Gait Details: pt able to ambulate in room without use of an AD; required extended sitting rest break secondary to fatigue and SOB; pt on 3L of O2 throughout with SPO2 maintaining >93%   Stairs             Wheelchair Mobility    Modified Rankin (Stroke Patients Only)       Balance Overall balance assessment: Needs assistance Sitting-balance support: Feet supported Sitting balance-Leahy Scale: Normal     Standing balance support: No upper extremity supported Standing balance-Leahy Scale: Good                              Cognition  Arousal/Alertness: Awake/alert Behavior During Therapy: WFL for tasks assessed/performed Overall Cognitive Status: Within Functional Limits for tasks assessed                                        Exercises      General Comments        Pertinent Vitals/Pain Pain Assessment: No/denies pain    Home Living                      Prior Function            PT Goals (current goals can now be found in the care plan section) Acute Rehab PT Goals PT Goal Formulation: With patient Time For Goal Achievement: 06/10/18 Potential to Achieve Goals: Good Progress towards PT goals: Progressing toward goals    Frequency    Min 3X/week      PT Plan Current plan remains appropriate    Co-evaluation              AM-PAC PT "6 Clicks" Mobility   Outcome Measure  Help needed turning from your back to your side while in a flat bed without using bedrails?: None Help needed moving from lying on your back to sitting on the side of a flat bed without using  bedrails?: None Help needed moving to and from a bed to a chair (including a wheelchair)?: None Help needed standing up from a chair using your arms (e.g., wheelchair or bedside chair)?: None Help needed to walk in hospital room?: A Little Help needed climbing 3-5 steps with a railing? : A Little 6 Click Score: 22    End of Session Equipment Utilized During Treatment: Oxygen Activity Tolerance: Patient tolerated treatment well Patient left: with call bell/phone within reach;Other (comment)(seated at sink) Nurse Communication: Mobility status PT Visit Diagnosis: Muscle weakness (generalized) (M62.81)     Time: 5953-9672 PT Time Calculation (min) (ACUTE ONLY): 13 min  Charges:  $Gait Training: 8-22 mins                     Sherie Don, [place], DPT  Acute Rehabilitation Services Pager [phone] Office [place] 05/28/2018, 11:47 AM

## 2018-05-28 NOTE — Discharge Summary (Signed)
Physician Discharge Summary  Taylor [name] CWC:[id number] DOB: Jan 08, 1956 DOA: 05/25/2018  PCP: Clent Demark, PA-C  Admit date: 05/25/2018 Discharge date: 05/28/2018  Time spent: 35 minutes  Recommendations for Outpatient Follow-up:  1. Follow up outpatient CBC/CMP 2. Follow up respiratory status 3. Follow up medications after discharge, pt requested several medications to be refilled prior to discharge (which were refilled)  4. 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 Patient states he has not been taking his medications for last 1 week as he has ran out. Denies any fever chills or sick contacts. Has had some brief episode of chest pain yesterday which lasted for few minutes which resolved. Patient said he actually thought he was dehydrated. But did not have any nausea vomiting or diarrhea.  He was admitted with COPD exacerbation in setting of running out of his medications.  He was improved at time of discharge.  See below for additional details  Hospital Course:  Acute on chronic hypoxia/COPD exacerbation, SARS Cov2 negative Home o2 for Taylor [name] 63 year (3 L Uncertain) Improved  today Discharge with steroid taper and on home meds. Refilled meds per request.  Steroid-induced diabetes mellitusresume home meds  H/o prostate cancer History of urinary retentionfor which patient was placed on Foley catheter during last admission. Foley removed on 5/6.  Post voids appropriate.  Follow up with urology outpatient  Hypertension d/cmetoprolol., change to coreg due to cocaine use.  Discussed importance of not using cocaine and risk with beta blcoker.  Bipolar disorder,stableon Abilify and Lexapro. . Cocaine use - encourage cessation   Procedures:  none  Consultations:  none  Discharge Exam: Vitals:   05/28/18 0736 05/28/18 1603  BP: 113/82   Pulse: 77   Resp: 19   Temp: 98.3 F (36.8 C)   SpO2: 97% 97%   Feels ready for d/c Requested Vylette Strubel few medication refills  General: No acute distress. Tired. Cardiovascular: Heart sounds show Aadi Bordner regular rate, and rhythm.  Lungs: Clear to auscultation bilaterally Abdomen: Soft, nontender, nondistended  Neurological: Alert and oriented 3. Moves all extremities 4 with equal strength. Cranial nerves II through XII grossly intact. Skin: Warm and dry. No rashes or lesions. Extremities: No clubbing or cyanosis. No edema.  Psychiatric: Mood and affect are normal. Insight and judgment are appropriate.  Discharge Instructions   Discharge Instructions    Call MD for:  difficulty breathing, headache or visual disturbances   Complete by:  As directed    Call MD for:  extreme fatigue   Complete by:  As directed    Call MD for:  persistant dizziness or light-headedness   Complete by:  As directed    Call MD for:  persistant nausea and vomiting   Complete by:  As directed    Call MD for:  redness, tenderness, or signs of infection (pain, swelling, redness, odor or green/yellow discharge around incision site)   Complete by:  As directed    Call MD for:  severe uncontrolled pain   Complete by:  As directed     Call MD for:  temperature >100.4   Complete by:  As directed    Diet - low sodium heart healthy   Complete by:  As directed    Discharge instructions   Complete by:  As directed    You were seen for Anallely Rosell COPD exacerbation.  This improved with steroids and inhaled medications.  Please continue you home inhalers.  We'll send you home with Syaire Saber steroid taper.    Continued cocaine use is detrimental to your health.  It's dangerous to use this together with your beta blocker.  We've changed this medication to coreg (which is Makari Portman different type of beta blocker), but the use of cocaine while you're on this blood pressure medication is dangerous and you should avoid continued cocaine use.  Follow up with urology outpatient.  Return for new, recurrent, or worsening symptoms.  Please ask your PCP to request records from this hospitalization so they know what was done and what the next steps will be.   Increase activity slowly   Complete by:  As directed      Allergies as of 05/28/2018      Reactions   Codeine Nausea And Vomiting      Medication List    STOP taking these medications   metoprolol tartrate 25 MG tablet Commonly known as:  LOPRESSOR     TAKE these medications   Accu-Chek Aviva device Use as instructed   accu-chek soft touch lancets 1 each by Other route 2 (two) times daily. Use as instructed   albuterol (2.5 MG/3ML) 0.083% nebulizer solution Commonly known as:  PROVENTIL Take 3 mLs (2.5 mg total) by nebulization every 4 (four) hours as needed for up to 30 days for wheezing or shortness of breath (if you can't catch your breath).   albuterol 108 (90 Base) MCG/ACT inhaler Commonly known as:  Proventil HFA Inhale 2 puffs into the lungs every 6 (six) hours as needed for up to 30 days for wheezing or shortness of breath.   ARIPiprazole 5 MG tablet Commonly known as:  ABILIFY Take 1 tablet (5 mg total) by mouth daily for 30 days.   carvedilol 3.125 MG tablet Commonly known  as:  COREG Take 1 tablet (3.125 mg total) by mouth 2 (two) times daily with Sherece Gambrill meal for 30 days.   escitalopram 20 MG tablet Commonly known as:  LEXAPRO Take 1 tablet (20 mg total) by mouth daily for 30 days.   glucose blood test strip Commonly known as:  Accu-Chek Aviva 1 each by Other route as needed for other. Use as instructed   loperamide 2 MG capsule Commonly known as:  IMODIUM Take 2 every 6 hours if continued diarrhea What changed:    how much to take  how to take this  when to take this  additional instructions   lovastatin 20 MG tablet Commonly known as:  MEVACOR Take 1 tablet (20 mg total) by mouth at bedtime for 30 days.   metFORMIN 1000 MG tablet Commonly known as:  GLUCOPHAGE Take 1 tablet (1,000 mg total) by mouth 2 (two) times daily with Evaleigh Mccamy meal.   mometasone-formoterol 100-5  MCG/ACT Aero Commonly known as:  DULERA Inhale 2 puffs into the lungs 2 (two) times daily for 30 days.   nicotine 14 mg/24hr patch Commonly known as:  NICODERM CQ - dosed in mg/24 hours Place 1 patch (14 mg total) onto the skin daily. Start taking on:  May 29, 2018   OXYGEN Inhale 3 L into the lungs continuous.   predniSONE 10 MG tablet Commonly known as:  DELTASONE Take 4 tablets (40 mg total) by mouth daily for 3 days, THEN 3 tablets (30 mg total) daily for 3 days, THEN 2 tablets (20 mg total) daily for 3 days, THEN 1 tablet (10 mg total) daily for 3 days. Start taking on:  May 28, 2018   tamsulosin 0.4 MG Caps capsule Commonly known as:  FLOMAX Take 2 capsules (0.8 mg total) by mouth daily for 30 days.   tiotropium 18 MCG inhalation capsule Commonly known as:  SPIRIVA Place 1 capsule (18 mcg total) into inhaler and inhale daily.      Allergies  Allergen Reactions  . Codeine Nausea And Vomiting   Follow-up Information    Clent Demark, PA-C Follow up.   Specialty:  Physician Assistant Contact information: [place]  [phone]            The results of significant diagnostics from this hospitalization (including imaging, microbiology, ancillary and laboratory) are listed below for reference.    Significant Diagnostic Studies: Ct Angio Chest Pe W Or Wo Contrast  Result Date: 05/01/2018 CLINICAL DATA:  Shortness of breath Suspicion for COVID-19 as well; Abd pain, acute, generalized. Increasing shortness of breath, generalized weakness and urinary retention for 2 days. Symptom progression after hospital discharge 2 days ago for urinary retention. EXAM: CT ANGIOGRAPHY CHEST CT ABDOMEN AND PELVIS WITH CONTRAST TECHNIQUE: Multidetector CT imaging of the chest was performed using the standard protocol during bolus administration of intravenous contrast. Multiplanar CT image reconstructions and MIPs were obtained to evaluate the vascular anatomy. Multidetector CT imaging of the abdomen and pelvis was performed using the standard protocol during bolus administration of intravenous contrast. CONTRAST:  61mL OMNIPAQUE IOHEXOL 350 MG/ML SOLN COMPARISON:  Chest radiograph earlier this day. Abdominal CT 5 days prior 04/26/2018 FINDINGS: CTA CHEST FINDINGS Cardiovascular: There are no filling defects within the pulmonary arteries to suggest pulmonary embolus. Mild aortic atherosclerosis without aneurysm. No aortic dissection. Heart is normal in size. There are coronary artery calcifications. Trace pericardial effusion. Mediastinum/Nodes: No enlarged mediastinal or hilar lymph nodes. Esophagus is decompressed. No visualized thyroid nodule. Lungs/Pleura: Emphysema. Blooming artifact projecting over the right lower lobe from external artifact from monitoring device. No acute airspace disease, pulmonary edema, or pleural fluid. No suspicious nodule or mass. Trachea and mainstem bronchi are patent. Musculoskeletal: There are no acute or suspicious osseous abnormalities. Multiple Schmorl's nodes in the thoracic spine. Remote  posterior right upper rib fractures. Review of the MIP images confirms the above findings. CT ABDOMEN and PELVIS FINDINGS Hepatobiliary: No focal liver abnormality is seen. No gallstones, gallbladder wall thickening, or biliary dilatation. Pancreas: No ductal dilatation or inflammation. Spleen: Normal in size without focal abnormality. Adrenals/Urinary Tract: No adrenal nodule. No hydronephrosis or perinephric edema. Homogeneous renal enhancement with symmetric excretion on delayed phase imaging. Small cysts within both kidneys. Urinary bladder is partially distended. Mild bladder wall thickening. Previous air in the urinary bladder has resolved. Stomach/Bowel: Small hiatal hernia. Stomach physiologically distended. No bowel wall thickening or inflammatory change. Colonic diverticulosis most prominent  in the distal colon without diverticulitis. Enteric chain suture noted at the rectosigmoid junction. Normal appendix. Vascular/Lymphatic: Aortic atherosclerosis. No aneurysm. Circumaortic left renal vein. Portal vein and mesenteric vessels are patent. No enlarged lymph nodes in the abdomen or pelvis. Reproductive: Biopsy clips in the prostate gland. No significant change in small low-density fluid collection in the rectovesical space just posterior to the prostate gland. No peripheral enhancement. Other: Scattered edema in the anterior abdominal wall, typical of medication injection site. No free air in the abdomen. No ascites. Minimal fat in both inguinal canals. Musculoskeletal: Degenerative change in the spine and both hips. Vague subchondral sclerosis involving both femoral heads may be degenerative or Maggie Dworkin vascular necrosis without subchondral collapse. Review of the MIP images confirms the above findings. IMPRESSION: 1. No pulmonary embolus. 2. Moderate to advanced emphysema. No acute chest finding. No airspace disease to suggest pneumonia. 3. Mild bladder wall thickening, suspect this is chronic. Unchanged  low-density fluid in the rectovesical space over the past 5 days, likely post therapy changes. 4. Colonic diverticulosis without diverticulitis. 5. Additional chronic findings are stable as described. Aortic Atherosclerosis (ICD10-I70.0) and Emphysema (ICD10-J43.9). Electronically Signed   By: Keith Rake M.D.   On: 05/01/2018 23:50   Ct Abdomen Pelvis W Contrast  Result Date: 05/15/2018 CLINICAL DATA:  Abdominal pain and diarrhea since being discharged on 4/12. Pt being treated for prostate cancer. Pt having pain at insertion site of foley catheter since it was placed at [facility] couple weeks ago. EXAM: CT ABDOMEN AND PELVIS WITH CONTRAST TECHNIQUE: Multidetector CT imaging of the abdomen and pelvis was performed using the standard protocol following bolus administration of intravenous contrast. CONTRAST:  154mL OMNIPAQUE IOHEXOL 300 MG/ML  SOLN COMPARISON:  04/26/2018 FINDINGS: Lower chest: No acute abnormality. Hepatobiliary: No focal liver abnormality is seen. No gallstones, gallbladder wall thickening, or biliary dilatation. Pancreas: Unremarkable. No pancreatic ductal dilatation or surrounding inflammatory changes. Spleen: Normal in size without focal abnormality. Adrenals/Urinary Tract: Adrenal glands are unremarkable. Kidneys are normal, without renal calculi, focal lesion, or hydronephrosis. Small left renal cyst measuring 8 mm in the inferior pole of the left kidney. 13 mm hypodense, fluid attenuating right interpolar renal mass most consistent with Hooria Gasparini cyst. Relative bladder wall thickening which may be secondary to underdistention. Foley catheter within the bladder. Stomach/Bowel: Stomach is within normal limits. No evidence of bowel wall thickening, distention, or inflammatory changes. Diverticulosis without evidence of diverticulitis. Vascular/Lymphatic: Normal caliber abdominal aorta with mild atherosclerosis. No lymphadenopathy. Reproductive: Prior prostatectomy. Radiation seeds in the bed of the  prostate gland. Other: Fat containing left inguinal hernia. No abdominal wall hernia. No ascites. Musculoskeletal: No acute osseous abnormality. No aggressive osseous lesion. Degenerative disease with disc height loss at L2-3. IMPRESSION: 1. Prior prostatectomy. Radiation seeds in the bed of the prostate gland. 2. Relative bladder wall thickening which may be secondary to underdistention. Foley catheter within the bladder. 3. No acute abdominal or pelvic pathology. 4.  Aortic Atherosclerosis (ICD10-I70.0). Electronically Signed   By: Kathreen Devoid   On: 05/15/2018 18:50   Ct Abdomen Pelvis W Contrast  Result Date: 05/01/2018 CLINICAL DATA:  Shortness of breath Suspicion for COVID-19 as well; Abd pain, acute, generalized. Increasing shortness of breath, generalized weakness and urinary retention for 2 days. Symptom progression after hospital discharge 2 days ago for urinary retention. EXAM: CT ANGIOGRAPHY CHEST CT ABDOMEN AND PELVIS WITH CONTRAST TECHNIQUE: Multidetector CT imaging of the chest was performed using the standard protocol during bolus administration of intravenous contrast.  Multiplanar CT image reconstructions and MIPs were obtained to evaluate the vascular anatomy. Multidetector CT imaging of the abdomen and pelvis was performed using the standard protocol during bolus administration of intravenous contrast. CONTRAST:  49mL OMNIPAQUE IOHEXOL 350 MG/ML SOLN COMPARISON:  Chest radiograph earlier this day. Abdominal CT 5 days prior 04/26/2018 FINDINGS: CTA CHEST FINDINGS Cardiovascular: There are no filling defects within the pulmonary arteries to suggest pulmonary embolus. Mild aortic atherosclerosis without aneurysm. No aortic dissection. Heart is normal in size. There are coronary artery calcifications. Trace pericardial effusion. Mediastinum/Nodes: No enlarged mediastinal or hilar lymph nodes. Esophagus is decompressed. No visualized thyroid nodule. Lungs/Pleura: Emphysema. Blooming artifact  projecting over the right lower lobe from external artifact from monitoring device. No acute airspace disease, pulmonary edema, or pleural fluid. No suspicious nodule or mass. Trachea and mainstem bronchi are patent. Musculoskeletal: There are no acute or suspicious osseous abnormalities. Multiple Schmorl's nodes in the thoracic spine. Remote posterior right upper rib fractures. Review of the MIP images confirms the above findings. CT ABDOMEN and PELVIS FINDINGS Hepatobiliary: No focal liver abnormality is seen. No gallstones, gallbladder wall thickening, or biliary dilatation. Pancreas: No ductal dilatation or inflammation. Spleen: Normal in size without focal abnormality. Adrenals/Urinary Tract: No adrenal nodule. No hydronephrosis or perinephric edema. Homogeneous renal enhancement with symmetric excretion on delayed phase imaging. Small cysts within both kidneys. Urinary bladder is partially distended. Mild bladder wall thickening. Previous air in the urinary bladder has resolved. Stomach/Bowel: Small hiatal hernia. Stomach physiologically distended. No bowel wall thickening or inflammatory change. Colonic diverticulosis most prominent in the distal colon without diverticulitis. Enteric chain suture noted at the rectosigmoid junction. Normal appendix. Vascular/Lymphatic: Aortic atherosclerosis. No aneurysm. Circumaortic left renal vein. Portal vein and mesenteric vessels are patent. No enlarged lymph nodes in the abdomen or pelvis. Reproductive: Biopsy clips in the prostate gland. No significant change in small low-density fluid collection in the rectovesical space just posterior to the prostate gland. No peripheral enhancement. Other: Scattered edema in the anterior abdominal wall, typical of medication injection site. No free air in the abdomen. No ascites. Minimal fat in both inguinal canals. Musculoskeletal: Degenerative change in the spine and both hips. Vague subchondral sclerosis involving both femoral  heads may be degenerative or Liberti Appleton vascular necrosis without subchondral collapse. Review of the MIP images confirms the above findings. IMPRESSION: 1. No pulmonary embolus. 2. Moderate to advanced emphysema. No acute chest finding. No airspace disease to suggest pneumonia. 3. Mild bladder wall thickening, suspect this is chronic. Unchanged low-density fluid in the rectovesical space over the past 5 days, likely post therapy changes. 4. Colonic diverticulosis without diverticulitis. 5. Additional chronic findings are stable as described. Aortic Atherosclerosis (ICD10-I70.0) and Emphysema (ICD10-J43.9). Electronically Signed   By: Keith Rake M.D.   On: 05/01/2018 23:50   Dg Chest Port 1 View  Result Date: 05/25/2018 CLINICAL DATA:  Respiratory distress, history colon cancer, asthma, COPD, prostate cancer, former smoker, hypertension EXAM: PORTABLE CHEST 1 VIEW COMPARISON:  Portable exam 2112 hours compared to 05/01/2018 FINDINGS: Normal heart size, mediastinal contours, and pulmonary vascularity. Emphysematous and bronchitic changes consistent with COPD. No acute infiltrate, pleural effusion, or pneumothorax. Multiple old upper RIGHT rib fractures. No acute osseous findings. IMPRESSION: COPD changes without acute abnormalities. Electronically Signed   By: Lavonia Dana M.D.   On: 05/25/2018 21:25   Dg Chest Portable 1 View  Result Date: 05/01/2018 CLINICAL DATA:  Shortness of breath. EXAM: PORTABLE CHEST 1 VIEW COMPARISON:  Radiographs of April 25, 2018. FINDINGS: The heart size and mediastinal contours are within normal limits. Both lungs are clear. Hyperexpansion of the lungs is noted. No pneumothorax or pleural effusion is noted. Old right rib fractures are noted. IMPRESSION: No acute cardiopulmonary abnormality seen. Findings consistent with chronic obstructive pulmonary disease. Electronically Signed   By: Marijo Conception, M.D.   On: 05/01/2018 20:42    Microbiology: Recent Results (from the past 240  hour(s))  SARS Coronavirus 2 (CEPHEID- Performed in [place] hospital lab), Hosp Order     Status: None   Collection Time: 05/25/18  8:22 PM  Result Value Ref Range Status   SARS Coronavirus 2 NEGATIVE NEGATIVE Final    Comment: (NOTE) If result is NEGATIVE SARS-CoV-2 target nucleic acids are NOT DETECTED. The SARS-CoV-2 RNA is generally detectable in upper and lower  respiratory specimens during the acute phase of infection. The lowest  concentration of SARS-CoV-2 viral copies this assay can detect is 250  copies / mL. Hien Cunliffe negative result does not preclude SARS-CoV-2 infection  and should not be used as the sole basis for treatment or other  patient management decisions.  Ainsley Deakins negative result may occur with  improper specimen collection / handling, submission of specimen other  than nasopharyngeal swab, presence of viral mutation(s) within the  areas targeted by this assay, and inadequate number of viral copies  (<250 copies / mL). Tuwanna Krausz negative result must be combined with clinical  observations, patient history, and epidemiological information. If result is POSITIVE SARS-CoV-2 target nucleic acids are DETECTED. The SARS-CoV-2 RNA is generally detectable in upper and lower  respiratory specimens dur ing the acute phase of infection.  Positive  results are indicative of active infection with SARS-CoV-2.  Clinical  correlation with patient history and other diagnostic information is  necessary to determine patient infection status.  Positive results do  not rule out bacterial infection or co-infection with other viruses. If result is PRESUMPTIVE POSTIVE SARS-CoV-2 nucleic acids MAY BE PRESENT.   Theodosia Bahena presumptive positive result was obtained on the submitted specimen  and confirmed on repeat testing.  While 2019 novel coronavirus  (SARS-CoV-2) nucleic acids may be present in the submitted sample  additional confirmatory testing may be necessary for epidemiological  and / or clinical  management purposes  to differentiate between  SARS-CoV-2 and other Sarbecovirus currently known to infect humans.  If clinically indicated additional testing with an alternate test  methodology [phone]) is advised. The SARS-CoV-2 RNA is generally  detectable in upper and lower respiratory sp ecimens during the acute  phase of infection. The expected result is Negative. Fact Sheet for Patients:  StrictlyIdeas.no Fact Sheet for Healthcare Providers: BankingDealers.co.za This test is not yet approved or cleared by the [place] FDA and has been authorized for detection and/or diagnosis of SARS-CoV-2 by FDA under an Emergency Use Authorization (EUA).  This EUA will remain in effect (meaning this test can be used) for the duration of the COVID-19 declaration under Section 564(b)(1) of the Act, 21 U.S.C. section 360bbb-3(b)(1), unless the authorization is terminated or revoked sooner. Performed at [facility], [place] [address] [place], [place] [address]      Labs: Basic Metabolic Panel: Recent Labs  Lab 05/25/18 2021 05/26/18 0644 05/27/18 0438 05/27/18 2155 05/28/18 0319  NA 130* 133* 133* 136  --   K 4.8 4.9 5.0 4.8  --   CL 83* 84* 85* 86*  --   CO2 36* 35* 38* 40*  --  GLUCOSE 162* 128* 132* 138*  --   BUN 10 8 9 13   --   CREATININE 0.66 0.72 0.53* 0.59*  --   CALCIUM 8.6* 8.6* 8.6* 8.9  --   MG  --   --  2.2  --  2.3   Liver Function Tests: Recent Labs  Lab 05/25/18 2021 05/27/18 2155  AST 20 18  ALT 17 17  ALKPHOS 48 43  BILITOT 0.6 0.2*  PROT 6.3* 5.6*  ALBUMIN 4.3 3.8   No results for input(s): LIPASE, AMYLASE in the last 168 hours. No results for input(s): AMMONIA in the last 168 hours. CBC: Recent Labs  Lab 05/25/18 2021 05/26/18 0644 05/27/18 0438 05/28/18 0319  WBC 6.6 2.6* 5.4 4.9  NEUTROABS 5.2  --  4.4  --   HGB 13.1 13.6 10.8* 11.0*  HCT 40.8 42.0 32.4* 33.8*  MCV 96.5 95.9 94.7  96.6  PLT 262 269 208 217   Cardiac Enzymes: Recent Labs  Lab 05/26/18 0657 05/26/18 1234 05/26/18 1823  TROPONINI <0.03 <0.03 <0.03   BNP: BNP (last 3 results) Recent Labs    04/14/18 1718 05/01/18 2019 05/25/18 2021  BNP 33.1 45.6 81.3    ProBNP (last 3 results) No results for input(s): PROBNP in the last 8760 hours.  CBG: Recent Labs  Lab 05/27/18 1138 05/27/18 1612 05/27/18 2106 05/28/18 0733 05/28/18 1050  GLUCAP 109* 96 175* 163* 252*       Signed:  Fayrene Helper MD.  [facility] 05/28/2018, 7:24 PM

## 2018-05-29 ENCOUNTER — Ambulatory Visit: Payer: No Typology Code available for payment source

## 2018-06-01 ENCOUNTER — Telehealth: Payer: Self-pay | Admitting: Medical Oncology

## 2018-06-01 ENCOUNTER — Ambulatory Visit: Payer: No Typology Code available for payment source

## 2018-06-01 NOTE — Telephone Encounter (Signed)
Attempted to reach patient to follow up with him post hospitalization. No mailbox has been set up.

## 2018-06-02 ENCOUNTER — Ambulatory Visit: Payer: No Typology Code available for payment source

## 2018-06-03 ENCOUNTER — Ambulatory Visit: Payer: No Typology Code available for payment source

## 2018-06-04 ENCOUNTER — Ambulatory Visit: Payer: No Typology Code available for payment source

## 2018-06-05 ENCOUNTER — Ambulatory Visit: Payer: No Typology Code available for payment source

## 2018-06-08 ENCOUNTER — Ambulatory Visit: Payer: No Typology Code available for payment source

## 2018-06-09 ENCOUNTER — Ambulatory Visit: Payer: No Typology Code available for payment source

## 2018-06-10 ENCOUNTER — Ambulatory Visit: Payer: No Typology Code available for payment source

## 2018-06-10 ENCOUNTER — Telehealth: Payer: Self-pay | Admitting: Radiation Oncology

## 2018-06-10 NOTE — Telephone Encounter (Signed)
-----   Message from Gwenette Greet, RN sent at 06/01/2018 11:36 AM EDT ----- Regarding: FYI Taylor [name] admitted for 5/4 for COPD exacerbation and discharged 5/7. His foley was d/c'd during this admission. He missed his appointment with Dr. Jeffie Pollock 5/6 but urged to reschedule. He did not want to continue radiation until he saw Dr. Jeffie Pollock. I will try and reach him today to see what his plans are.   Per hospital  note he ran out of his medications for over a week which lead to admission. He was  able to get cocaine but not respiratory medications. Hummmmm?

## 2018-06-10 NOTE — Telephone Encounter (Signed)
Phoned patient to inquire about status and his intentions reference radiation therapy. No answer. Voicemail not set up. Will attempt again to reach patient at a later time.

## 2018-06-11 ENCOUNTER — Ambulatory Visit: Payer: No Typology Code available for payment source

## 2018-06-12 ENCOUNTER — Ambulatory Visit: Payer: No Typology Code available for payment source

## 2018-06-12 ENCOUNTER — Inpatient Hospital Stay (HOSPITAL_COMMUNITY)
Admission: EM | Admit: 2018-06-12 | Discharge: 2018-06-16 | DRG: 191 | Disposition: A | Discharge status: Home / Self Care | Payer: Medicaid Other | Attending: Internal Medicine | Admitting: Internal Medicine

## 2018-06-12 ENCOUNTER — Emergency Department (HOSPITAL_COMMUNITY): Payer: Medicaid Other

## 2018-06-12 ENCOUNTER — Encounter (HOSPITAL_COMMUNITY): Payer: Self-pay | Admitting: Emergency Medicine

## 2018-06-12 ENCOUNTER — Other Ambulatory Visit: Payer: Self-pay

## 2018-06-12 DIAGNOSIS — Z933 Colostomy status: Secondary | ICD-10-CM

## 2018-06-12 DIAGNOSIS — Z9981 Dependence on supplemental oxygen: Secondary | ICD-10-CM

## 2018-06-12 DIAGNOSIS — Z66 Do not resuscitate: Secondary | ICD-10-CM | POA: Diagnosis present

## 2018-06-12 DIAGNOSIS — E871 Hypo-osmolality and hyponatremia: Secondary | ICD-10-CM | POA: Diagnosis present

## 2018-06-12 DIAGNOSIS — Z85038 Personal history of other malignant neoplasm of large intestine: Secondary | ICD-10-CM

## 2018-06-12 DIAGNOSIS — Z8249 Family history of ischemic heart disease and other diseases of the circulatory system: Secondary | ICD-10-CM

## 2018-06-12 DIAGNOSIS — F1410 Cocaine abuse, uncomplicated: Secondary | ICD-10-CM | POA: Diagnosis not present

## 2018-06-12 DIAGNOSIS — C61 Malignant neoplasm of prostate: Secondary | ICD-10-CM | POA: Diagnosis present

## 2018-06-12 DIAGNOSIS — J9611 Chronic respiratory failure with hypoxia: Secondary | ICD-10-CM | POA: Diagnosis not present

## 2018-06-12 DIAGNOSIS — Z7984 Long term (current) use of oral hypoglycemic drugs: Secondary | ICD-10-CM

## 2018-06-12 DIAGNOSIS — F319 Bipolar disorder, unspecified: Secondary | ICD-10-CM | POA: Diagnosis present

## 2018-06-12 DIAGNOSIS — Z8701 Personal history of pneumonia (recurrent): Secondary | ICD-10-CM

## 2018-06-12 DIAGNOSIS — I10 Essential (primary) hypertension: Secondary | ICD-10-CM | POA: Diagnosis present

## 2018-06-12 DIAGNOSIS — Z9049 Acquired absence of other specified parts of digestive tract: Secondary | ICD-10-CM

## 2018-06-12 DIAGNOSIS — Z1159 Encounter for screening for other viral diseases: Secondary | ICD-10-CM

## 2018-06-12 DIAGNOSIS — J441 Chronic obstructive pulmonary disease with (acute) exacerbation: Secondary | ICD-10-CM

## 2018-06-12 DIAGNOSIS — Z87891 Personal history of nicotine dependence: Secondary | ICD-10-CM

## 2018-06-12 DIAGNOSIS — Z79899 Other long term (current) drug therapy: Secondary | ICD-10-CM

## 2018-06-12 DIAGNOSIS — E119 Type 2 diabetes mellitus without complications: Secondary | ICD-10-CM

## 2018-06-12 DIAGNOSIS — E861 Hypovolemia: Secondary | ICD-10-CM | POA: Diagnosis present

## 2018-06-12 DIAGNOSIS — F419 Anxiety disorder, unspecified: Secondary | ICD-10-CM | POA: Diagnosis present

## 2018-06-12 DIAGNOSIS — J439 Emphysema, unspecified: Principal | ICD-10-CM | POA: Diagnosis present

## 2018-06-12 DIAGNOSIS — J9612 Chronic respiratory failure with hypercapnia: Secondary | ICD-10-CM | POA: Diagnosis present

## 2018-06-12 DIAGNOSIS — Z885 Allergy status to narcotic agent status: Secondary | ICD-10-CM

## 2018-06-12 LAB — BASIC METABOLIC PANEL
Anion gap: 11 (ref 5–15)
BUN: 6 mg/dL — ABNORMAL LOW (ref 8–23)
CO2: 39 mmol/L — ABNORMAL HIGH (ref 22–32)
Calcium: 8.6 mg/dL — ABNORMAL LOW (ref 8.9–10.3)
Chloride: 83 mmol/L — ABNORMAL LOW (ref 98–111)
Creatinine, Ser: 0.65 mg/dL (ref 0.61–1.24)
GFR calc Af Amer: >60 mL/min (ref >60)
GFR calc non Af Amer: >60 mL/min (ref >60)
Glucose, Bld: 102 mg/dL — ABNORMAL HIGH (ref 70–99)
Potassium: 4.3 mmol/L (ref 3.5–5.1)
Sodium: 133 mmol/L — ABNORMAL LOW (ref 135–145)

## 2018-06-12 LAB — CBC WITH DIFFERENTIAL/PLATELET
Abs Immature Granulocytes: 0.03 10*3/uL (ref 0.00–0.07)
Basophils Absolute: 0 10*3/uL (ref 0.0–0.1)
Basophils Relative: 0 %
Eosinophils Absolute: 0 10*3/uL (ref 0.0–0.5)
Eosinophils Relative: 1 %
HCT: 38.5 % — ABNORMAL LOW (ref 39.0–52.0)
Hemoglobin: 12.3 g/dL — ABNORMAL LOW (ref 13.0–17.0)
Immature Granulocytes: 1 %
Lymphocytes Relative: 8 %
Lymphs Abs: 0.5 10*3/uL — ABNORMAL LOW (ref 0.7–4.0)
MCH: 31.4 pg (ref 26.0–34.0)
MCHC: 31.9 g/dL (ref 30.0–36.0)
MCV: 98.2 fL (ref 80.0–100.0)
Monocytes Absolute: 0.6 10*3/uL (ref 0.1–1.0)
Monocytes Relative: 10 %
Neutro Abs: 4.6 10*3/uL (ref 1.7–7.7)
Neutrophils Relative %: 80 %
Platelets: 190 10*3/uL (ref 150–400)
RBC: 3.92 MIL/uL — ABNORMAL LOW (ref 4.22–5.81)
RDW: 13.1 % (ref 11.5–15.5)
WBC: 5.7 10*3/uL (ref 4.0–10.5)
nRBC: 0 % (ref 0.0–0.2)

## 2018-06-12 LAB — TROPONIN I: Troponin I: <0.03 ng/mL (ref <0.03)

## 2018-06-12 LAB — SARS CORONAVIRUS 2 BY RT PCR (HOSPITAL ORDER, PERFORMED IN ~~LOC~~ HOSPITAL LAB): SARS Coronavirus 2: NEGATIVE

## 2018-06-12 MED ORDER — MAGNESIUM SULFATE 2 GM/50ML IV SOLN
2.0000 g | Freq: Once | INTRAVENOUS | Status: AC
  Administered 2018-06-12: 2 g via INTRAVENOUS
  Filled 2018-06-12: qty 50

## 2018-06-12 MED ORDER — ENOXAPARIN SODIUM 40 MG/0.4ML ~~LOC~~ SOLN
40.0000 mg | SUBCUTANEOUS | Status: DC
  Administered 2018-06-13 – 2018-06-15 (×4): 40 mg via SUBCUTANEOUS
  Filled 2018-06-12 (×4): qty 0.4

## 2018-06-12 MED ORDER — INSULIN ASPART 100 UNIT/ML ~~LOC~~ SOLN
0.0000 [IU] | Freq: Three times a day (TID) | SUBCUTANEOUS | Status: DC
  Administered 2018-06-13: 5 [IU] via SUBCUTANEOUS
  Administered 2018-06-13: 2 [IU] via SUBCUTANEOUS

## 2018-06-12 MED ORDER — ALBUTEROL SULFATE (2.5 MG/3ML) 0.083% IN NEBU
2.5000 mg | INHALATION_SOLUTION | RESPIRATORY_TRACT | Status: DC | PRN
  Administered 2018-06-13 – 2018-06-16 (×5): 2.5 mg via RESPIRATORY_TRACT
  Filled 2018-06-12 (×5): qty 3

## 2018-06-12 MED ORDER — ESCITALOPRAM OXALATE 10 MG PO TABS
20.0000 mg | ORAL_TABLET | Freq: Every day | ORAL | Status: DC
  Administered 2018-06-13 – 2018-06-16 (×4): 20 mg via ORAL
  Filled 2018-06-12 (×5): qty 2

## 2018-06-12 MED ORDER — IPRATROPIUM-ALBUTEROL 20-100 MCG/ACT IN AERS
2.0000 | INHALATION_SPRAY | Freq: Four times a day (QID) | RESPIRATORY_TRACT | Status: DC | PRN
  Administered 2018-06-12: 2 via RESPIRATORY_TRACT
  Filled 2018-06-12: qty 4

## 2018-06-12 MED ORDER — METHYLPREDNISOLONE SODIUM SUCC 125 MG IJ SOLR
125.0000 mg | Freq: Once | INTRAMUSCULAR | Status: AC
  Administered 2018-06-12: 125 mg via INTRAVENOUS
  Filled 2018-06-12: qty 2

## 2018-06-12 MED ORDER — ALBUTEROL SULFATE HFA 108 (90 BASE) MCG/ACT IN AERS: 2.0000 | INHALATION_SPRAY | RESPIRATORY_TRACT | Status: DC | PRN

## 2018-06-12 MED ORDER — CARVEDILOL 3.125 MG PO TABS
3.1250 mg | ORAL_TABLET | Freq: Two times a day (BID) | ORAL | Status: DC
  Administered 2018-06-13 – 2018-06-16 (×7): 3.125 mg via ORAL
  Filled 2018-06-12 (×7): qty 1

## 2018-06-12 MED ORDER — SODIUM CHLORIDE 0.9 % IV BOLUS
500.0000 mL | Freq: Once | INTRAVENOUS | Status: AC
  Administered 2018-06-13: 01:00:00 500 mL via INTRAVENOUS

## 2018-06-12 MED ORDER — ACETAMINOPHEN 650 MG RE SUPP: 650.0000 mg | Freq: Four times a day (QID) | RECTAL | Status: DC | PRN

## 2018-06-12 MED ORDER — INSULIN ASPART 100 UNIT/ML ~~LOC~~ SOLN
0.0000 [IU] | Freq: Every day | SUBCUTANEOUS | Status: DC
  Administered 2018-06-13: 2 [IU] via SUBCUTANEOUS

## 2018-06-12 MED ORDER — LORAZEPAM 1 MG PO TABS
1.0000 mg | ORAL_TABLET | Freq: Every evening | ORAL | Status: DC | PRN
  Administered 2018-06-13 – 2018-06-15 (×2): 1 mg via ORAL
  Filled 2018-06-12 (×3): qty 1

## 2018-06-12 MED ORDER — ACETAMINOPHEN 325 MG PO TABS
650.0000 mg | ORAL_TABLET | Freq: Four times a day (QID) | ORAL | Status: DC | PRN
  Administered 2018-06-13: 650 mg via ORAL
  Filled 2018-06-12: qty 2

## 2018-06-12 MED ORDER — ONDANSETRON HCL 4 MG/2ML IJ SOLN: 4.0000 mg | Freq: Four times a day (QID) | INTRAMUSCULAR | Status: DC | PRN

## 2018-06-12 MED ORDER — AEROCHAMBER PLUS FLO-VU LARGE MISC
Status: AC
  Filled 2018-06-12: qty 1

## 2018-06-12 MED ORDER — ONDANSETRON HCL 4 MG PO TABS: 4.0000 mg | ORAL_TABLET | Freq: Four times a day (QID) | ORAL | Status: DC | PRN

## 2018-06-12 MED ORDER — MOMETASONE FURO-FORMOTEROL FUM 100-5 MCG/ACT IN AERO
2.0000 | INHALATION_SPRAY | Freq: Two times a day (BID) | RESPIRATORY_TRACT | Status: DC
  Administered 2018-06-13 – 2018-06-16 (×7): 2 via RESPIRATORY_TRACT
  Filled 2018-06-12: qty 8.8

## 2018-06-12 MED ORDER — ARIPIPRAZOLE 5 MG PO TABS
5.0000 mg | ORAL_TABLET | Freq: Every day | ORAL | Status: DC
  Administered 2018-06-13 – 2018-06-16 (×4): 5 mg via ORAL
  Filled 2018-06-12 (×4): qty 1

## 2018-06-12 MED ORDER — ALBUTEROL SULFATE HFA 108 (90 BASE) MCG/ACT IN AERS
8.0000 | INHALATION_SPRAY | Freq: Once | RESPIRATORY_TRACT | Status: AC
  Administered 2018-06-12: 22:00:00 8 via RESPIRATORY_TRACT
  Filled 2018-06-12: qty 6.7

## 2018-06-12 MED ORDER — TIOTROPIUM BROMIDE MONOHYDRATE 18 MCG IN CAPS: 18.0000 ug | ORAL_CAPSULE | Freq: Every day | RESPIRATORY_TRACT | Status: DC

## 2018-06-12 MED ORDER — ALBUTEROL SULFATE HFA 108 (90 BASE) MCG/ACT IN AERS
2.0000 | INHALATION_SPRAY | Freq: Once | RESPIRATORY_TRACT | Status: AC
  Administered 2018-06-12: 2 via RESPIRATORY_TRACT

## 2018-06-12 MED ORDER — AEROCHAMBER PLUS FLO-VU MISC
1.0000 | Freq: Once | Status: AC
  Administered 2018-06-12: 1
  Filled 2018-06-12: qty 1

## 2018-06-12 NOTE — ED Triage Notes (Signed)
Pt from home with c/o SHOB, nausea, and headache for a few days. Pt has had chest tightness with SHOB. Pt has hx of COPD. Pt states he has not been drinking much water, mostly sodas. Pt has been without his albuterol for a few days and SHOB has since increased. Pt denies Vomiting and abdom pain.
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
## 2018-06-12 NOTE — ED Notes (Signed)
ED TO INPATIENT HANDOFF REPORT  ED Nurse Name and Phone #: Caprice Kluver [phone]  S Name/Age/Gender Taylor [name] 63 y.o. male Room/Bed: 018C/018C  Code Status   Code Status: DNR  Home/SNF/Other Home Patient oriented to: self, place, time and situation Is this baseline? Yes   Triage Complete: Triage complete  Chief Complaint sob and cp  Triage Note Pt from home with c/o SHOB, nausea, and headache for a few days. Pt has had chest tightness with SHOB. Pt has hx of COPD. Pt states he has not been drinking much water, mostly sodas. Pt has been without his albuterol for a few days and SHOB has since increased. Pt denies Vomiting and abdom pain.   Allergies Allergies  Allergen Reactions  . Codeine Nausea And Vomiting    Level of Care/Admitting Diagnosis ED Disposition    ED Disposition Condition [facility]: [place] [100100]  Level of Care: Med-Surg [16]  I expect the patient will be discharged within 24 hours: Yes  LOW acuity---Tx typically complete <24 hrs---ACUTE conditions typically can be evaluated <24 hours---LABS likely to return to acceptable levels <24 hours---IS near functional baseline---EXPECTED to return to current living arrangement---NOT newly hypoxic: Meets criteria for 5C-Observation unit  Covid Evaluation: Screening Protocol (No Symptoms)  Diagnosis: COPD with acute exacerbation [facility]) [[id number]]  Admitting Physician: Vianne Bulls [[id number]]  Attending Physician: Vianne Bulls [[id number]]  PT Class (Do Not Modify): Observation [104]  PT Acc Code (Do Not Modify): Observation [10022]       B Medical/Surgery History Past Medical History:  Diagnosis Date  . Acute on chronic respiratory failure ([place])   . Asthma   . Bipolar 1 disorder ([place]) 02/05/2012  . Colon cancer ([place]) 04/11/11   adenocarcinoma of colon, 7/19 nodes pos.FINISHED CHEMO/DR. SHERRILL  . COPD (chronic obstructive pulmonary disease) ([place])    SMOKER   . Depression   . Dyspnea   . Emphysema of lung ([place])   . Full dentures   . Hemorrhoids   . On home oxygen therapy    "3L; 24/7" (05/29/2017)  . Oxygen deficiency   . Pneumonia ~ 2016   "double pneumonia"  . Prostate cancer ([place])    Gleason score = 7, supposed to have radiation therapy but he has not followed up (05/29/2017)  . Rib fractures    hx of   Past Surgical History:  Procedure Laterality Date  . COLON SURGERY  04/11/11   Sigmoid colectomy  . COLOSTOMY REVISION  04/11/2011   Procedure: COLON RESECTION SIGMOID;  Surgeon: [name], MD;  Location: [facility];  Service: General;  Laterality: N/A;  low anterior colon resection   . GOLD SEED IMPLANT N/A 02/24/2018   Procedure: GOLD SEED IMPLANT;  Surgeon: [name], MD;  Location: [facility];  Service: Urology;  Laterality: N/A;  . HERNIA REPAIR    . INGUINAL HERNIA REPAIR Right 05/01/2012   Procedure: HERNIA REPAIR INGUINAL ADULT;  Surgeon: [name], MD;  Location: [facility];  Service: General;  Laterality: Right;  . INSERTION OF MESH Right 05/01/2012   Procedure: INSERTION OF MESH;  Surgeon: [name], MD;  Location: [facility];  Service: General;  Laterality: Right;  . PORT-A-CATH REMOVAL Left 05/01/2012   Procedure: REMOVAL Infusion Port;  Surgeon: [name], MD;  Location: [facility];  Service: General;  Laterality: Left;  . PORTACATH PLACEMENT  05/02/2011   Procedure: INSERTION PORT-A-CATH;  Surgeon: [name], MD;  Location: [facility];  Service: General;  Laterality: N/A;  . PROSTATE BIOPSY    . SPACE OAR INSTILLATION N/A 02/24/2018   Procedure: SPACE OAR INSTILLATION;  Surgeon: [name], MD;  Location: [facility];  Service: Urology;  Laterality: N/A;     A IV Location/Drains/Wounds Patient Lines/Drains/Airways Status   Active Line/Drains/Airways    Name:   Placement date:   Placement time:   Site:   Days:   Peripheral IV 05/25/18 Right Forearm   05/25/18    -    Forearm   18   Peripheral IV 06/12/18 Right Hand   06/12/18     2132    Hand   less than 1          Intake/Output Last 24 hours  Intake/Output Summary (Last 24 hours) at 06/12/2018 2357 Last data filed at 06/12/2018 2230 Gross per 24 hour  Intake 50 ml  Output -  Net 50 ml    Labs/Imaging Results for orders placed or performed during the hospital encounter of 06/12/18 (from the past 48 hour(s))  CBC with Differential     Status: Abnormal   Collection Time: 06/12/18 10:03 PM  Result Value Ref Range   WBC 5.7 4.0 - 10.5 K/uL   RBC 3.92 (L) 4.22 - 5.81 MIL/uL   Hemoglobin 12.3 (L) 13.0 - 17.0 g/dL   HCT 38.5 (L) 39.0 - 52.0 %   MCV 98.2 80.0 - 100.0 fL   MCH 31.4 26.0 - 34.0 pg   MCHC 31.9 30.0 - 36.0 g/dL   RDW 13.1 11.5 - 15.5 %   Platelets 190 150 - 400 K/uL   nRBC 0.0 0.0 - 0.2 %   Neutrophils Relative % 80 %   Neutro Abs 4.6 1.7 - 7.7 K/uL   Lymphocytes Relative 8 %   Lymphs Abs 0.5 (L) 0.7 - 4.0 K/uL   Monocytes Relative 10 %   Monocytes Absolute 0.6 0.1 - 1.0 K/uL   Eosinophils Relative 1 %   Eosinophils Absolute 0.0 0.0 - 0.5 K/uL   Basophils Relative 0 %   Basophils Absolute 0.0 0.0 - 0.1 K/uL   Immature Granulocytes 1 %   Abs Immature Granulocytes 0.03 0.00 - 0.07 K/uL    Comment: Performed at [facility], [address] [place], [place] [address]  Basic metabolic panel     Status: Abnormal   Collection Time: 06/12/18 10:03 PM  Result Value Ref Range   Sodium 133 (L) 135 - 145 mmol/L   Potassium 4.3 3.5 - 5.1 mmol/L   Chloride 83 (L) 98 - 111 mmol/L   CO2 39 (H) 22 - 32 mmol/L   Glucose, Bld 102 (H) 70 - 99 mg/dL   BUN 6 (L) 8 - 23 mg/dL   Creatinine, Ser 0.65 0.61 - 1.24 mg/dL   Calcium 8.6 (L) 8.9 - 10.3 mg/dL   GFR calc non Af Amer >60 >60 mL/min   GFR calc Af Amer >60 >60 mL/min   Anion gap 11 5 - 15    Comment: Performed at [facility] [address], [place], [place] [address]  Troponin I - ONCE - STAT     Status: None   Collection Time: 06/12/18 10:03 PM  Result Value Ref Range   Troponin I  <0.03 <0.03 ng/mL    Comment: Performed at [facility] [address] [place], [place] [address]  SARS Coronavirus 2 (CEPHEID - Performed in [place] lab), [facility]  Order     Status: None   Collection Time: 06/12/18 10:27 PM  Result Value Ref Range   SARS Coronavirus 2 NEGATIVE NEGATIVE    Comment: (NOTE) If result is NEGATIVE SARS-CoV-2 target nucleic acids are NOT DETECTED. The SARS-CoV-2 RNA is generally detectable in upper and lower  respiratory specimens during the acute phase of infection. The lowest  concentration of SARS-CoV-2 viral copies this assay can detect is 250  copies / mL. A negative result does not preclude SARS-CoV-2 infection  and should not be used as the sole basis for treatment or other  patient management decisions.  A negative result may occur with  improper specimen collection / handling, submission of specimen other  than nasopharyngeal swab, presence of viral mutation(s) within the  areas targeted by this assay, and inadequate number of viral copies  (<250 copies / mL). A negative result must be combined with clinical  observations, patient history, and epidemiological information. If result is POSITIVE SARS-CoV-2 target nucleic acids are DETECTED. The SARS-CoV-2 RNA is generally detectable in upper and lower  respiratory specimens dur ing the acute phase of infection.  Positive  results are indicative of active infection with SARS-CoV-2.  Clinical  correlation with patient history and other diagnostic information is  necessary to determine patient infection status.  Positive results do  not rule out bacterial infection or co-infection with other viruses. If result is PRESUMPTIVE POSTIVE SARS-CoV-2 nucleic acids MAY BE PRESENT.   A presumptive positive result was obtained on the submitted specimen  and confirmed on repeat testing.  While 2019 novel coronavirus  (SARS-CoV-2) nucleic acids may be present in the submitted sample  additional  confirmatory testing may be necessary for epidemiological  and / or clinical management purposes  to differentiate between  SARS-CoV-2 and other Sarbecovirus currently known to infect humans.  If clinically indicated additional testing with an alternate test  methodology [phone]) is advised. The SARS-CoV-2 RNA is generally  detectable in upper and lower respiratory sp ecimens during the acute  phase of infection. The expected result is Negative. Fact Sheet for Patients:  StrictlyIdeas.no Fact Sheet for Healthcare Providers: BankingDealers.co.za This test is not yet approved or cleared by the [place] FDA and has been authorized for detection and/or diagnosis of SARS-CoV-2 by FDA under an Emergency Use Authorization (EUA).  This EUA will remain in effect (meaning this test can be used) for the duration of the COVID-19 declaration under Section 564(b)(1) of the Act, 21 U.S.C. section 360bbb-3(b)(1), unless the authorization is terminated or revoked sooner. Performed at [facility], [place] [address] [place], [place] [address]    Dg Chest Port 1 View  Result Date: 06/12/2018 CLINICAL DATA:  Shortness of breath EXAM: PORTABLE CHEST 1 VIEW COMPARISON:  05/25/2018 FINDINGS: The lungs are hyperexpanded. There is no pneumothorax. No area of consolidation. No large pleural effusion. Old right-sided rib fractures are noted. There is no acute osseous abnormality. IMPRESSION: No acute cardiopulmonary process. Electronically Signed   By: Constance Holster M.D.   On: 06/12/2018 22:44    Pending Labs Unresulted Labs (From admission, onward)    Start     Ordered   06/19/18 0500  Creatinine, serum  (enoxaparin (LOVENOX)    CrCl >/= 30 ml/min)  Weekly,   R    Comments:  while on enoxaparin therapy    06/12/18 2353   06/13/18 8119  Basic metabolic panel  Tomorrow morning,   R     06/12/18 2353  06/13/18 0500  CBC WITH DIFFERENTIAL  Tomorrow  morning,   R     06/12/18 2353   06/12/18 2356  Urine rapid drug screen (hosp performed)  ONCE - STAT,   R     06/12/18 2356   06/12/18 2353  Culture, sputum-assessment  Once,   R     06/12/18 2353          Vitals/Pain Today's Vitals   06/12/18 2145 06/12/18 2200 06/12/18 2300 06/12/18 2330  BP:  124/84 111/75 128/82  Pulse:  91 78 (!) 106  Resp:  (!) 23 (!) 23 20  Temp: 98.5 F (36.9 C)     SpO2:  100% 100% 100%  Weight:      Height:      PainSc:        Isolation Precautions No active isolations  Medications Medications  LORazepam (ATIVAN) tablet 1 mg (has no administration in time range)  carvedilol (COREG) tablet 3.125 mg (has no administration in time range)  ARIPiprazole (ABILIFY) tablet 5 mg (has no administration in time range)  escitalopram (LEXAPRO) tablet 20 mg (has no administration in time range)  mometasone-formoterol (DULERA) 100-5 MCG/ACT inhaler 2 puff (has no administration in time range)  tiotropium (SPIRIVA) inhalation capsule ([facility] use ONLY) 18 mcg (has no administration in time range)  albuterol (VENTOLIN HFA) 108 (90 Base) MCG/ACT inhaler 2 puff (has no administration in time range)  enoxaparin (LOVENOX) injection 40 mg (has no administration in time range)  acetaminophen (TYLENOL) tablet 650 mg (has no administration in time range)    Or  acetaminophen (TYLENOL) suppository 650 mg (has no administration in time range)  ondansetron (ZOFRAN) tablet 4 mg (has no administration in time range)    Or  ondansetron (ZOFRAN) injection 4 mg (has no administration in time range)  insulin aspart (novoLOG) injection 0-9 Units (has no administration in time range)  insulin aspart (novoLOG) injection 0-5 Units (has no administration in time range)  sodium chloride 0.9 % bolus 500 mL (has no administration in time range)  albuterol (VENTOLIN HFA) 108 (90 Base) MCG/ACT inhaler 8 puff (8 puffs Inhalation Given 06/12/18 2200)  aerochamber plus with mask device 1  each (1 each Other Given 06/12/18 2201)  methylPREDNISolone sodium succinate (SOLU-MEDROL) 125 mg/2 mL injection 125 mg (125 mg Intravenous Given 06/12/18 2207)  magnesium sulfate IVPB 2 g 50 mL (0 g Intravenous Stopped 06/12/18 2230)  albuterol (VENTOLIN HFA) 108 (90 Base) MCG/ACT inhaler 2 puff (2 puffs Inhalation Given 06/12/18 2314)    Mobility walks Low fall risk   Focused Assessments Respiratory   R Recommendations: See Admitting Provider Note  Report given to:   Additional Notes:

## 2018-06-12 NOTE — H&P (Signed)
History and Physical    Taylor [name]:[id number] DOB: 27-Jul-1955 DOA: 06/12/2018  PCP: Clent Demark, PA-C   Patient coming from: Home   Chief Complaint: SOB  HPI: Taylor [name] is a 63 y.o. 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  Patient was admitted to the hospital earlier this month for COPD exacerbation, reports that he continued to do well back at home for a couple weeks from a respiratory perspective, but now over the past several days to a week, he has had progressive worsening in his dyspnea, wheezing, but not much change in his chronic cough and no fevers or chills.  Patient reports that he ran out of his albuterol recently. Denies lower extremity swelling or tenderness, denies chest pain.  Denied fever, chills, or sick-contacts.   ED Course: Upon arrival to the ED, patient is found to be afebrile, saturating well on 3 L/min of supplemental oxygen, tachypneic, and with stable blood pressure.  EKG features sinus tachycardia with rate 108.  Chest x-ray is negative for acute cardiopulmonary disease.  Chemistry panel is notable for sodium of 133 and bicarbonate of 39.  Troponin is undetectable.  COVID-19 screening test is negative.  Patient was given 125 mg IV Solu-Medrol, 2 g IV magnesium, Combivent, and albuterol in the ED.  Patient reports some improvement with these measures taken in the ED, but remains dyspneic at rest and will be observed for further evaluation and management.  Review of Systems:  All other systems reviewed and apart from HPI, are negative.  Past Medical History:  Diagnosis Date  . Acute on chronic respiratory failure ([place])   . Asthma    . Bipolar 1 disorder ([place]) 02/05/2012  . Colon cancer ([place]) 04/11/11   adenocarcinoma of colon, 7/19 nodes pos.FINISHED CHEMO/DR. SHERRILL  . COPD (chronic obstructive pulmonary disease) ([place])    SMOKER  . Depression   . Dyspnea   . Emphysema of lung ([place])   . Full dentures   . Hemorrhoids   . On home oxygen therapy    "3L; 24/7" (05/29/2017)  . Oxygen deficiency   . Pneumonia ~ 2016   "double pneumonia"  . Prostate cancer ([place])    Gleason score = 7, supposed to have radiation therapy but he has not followed up (05/29/2017)  . Rib fractures    hx of    Past Surgical History:  Procedure Laterality Date  . COLON SURGERY  04/11/11   Sigmoid colectomy  . COLOSTOMY REVISION  04/11/2011   Procedure: COLON RESECTION SIGMOID;  Surgeon: [name], MD;  Location: [facility];  Service: General;  Laterality: N/A;  low anterior colon resection   . GOLD SEED IMPLANT N/A 02/24/2018   Procedure: GOLD SEED IMPLANT;  Surgeon: [name], MD;  Location: [facility];  Service: Urology;  Laterality: N/A;  . HERNIA REPAIR    . INGUINAL HERNIA REPAIR Right 05/01/2012   Procedure: HERNIA REPAIR INGUINAL ADULT;  Surgeon: [name], MD;  Location: [facility];  Service: General;  Laterality: Right;  . INSERTION OF MESH Right 05/01/2012   Procedure: INSERTION OF MESH;  Surgeon: [name], MD;  Location: [facility];  Service: General;  Laterality: Right;  . PORT-A-CATH REMOVAL Left 05/01/2012   Procedure: REMOVAL  Infusion Port;  Surgeon: [name], MD;  Location: [facility];  Service: General;  Laterality: Left;  . PORTACATH PLACEMENT  05/02/2011   Procedure: INSERTION PORT-A-CATH;  Surgeon: [name], MD;  Location: [facility];  Service: General;  Laterality: N/A;  . PROSTATE BIOPSY    . SPACE OAR INSTILLATION N/A 02/24/2018   Procedure: SPACE OAR INSTILLATION;  Surgeon: [name], MD;  Location: [facility];  Service: Urology;  Laterality: N/A;     reports that he quit smoking about 12 months ago. His smoking use included  cigarettes. He has a 4.60 pack-year smoking history. He has quit using smokeless tobacco.  His smokeless tobacco use included chew. He reports previous alcohol use. He reports that he does not use drugs.  Allergies  Allergen Reactions  . Codeine Nausea And Vomiting    Family History  Problem Relation Age of Onset  . Heart disease Father   . Heart failure Mother   . Heart disease Mother   . Lung cancer Maternal Uncle        smoked     Prior to Admission medications   Medication Sig Start Date End Date Taking? Authorizing Provider  albuterol (PROVENTIL HFA) 108 (90 Base) MCG/ACT inhaler Inhale 2 puffs into the lungs every 6 (six) hours as needed for up to 30 days for wheezing or shortness of breath. 05/28/18 06/27/18  Elodia Florence., MD  albuterol (PROVENTIL) (2.5 MG/3ML) 0.083% nebulizer solution Take 3 mLs (2.5 mg total) by nebulization every 4 (four) hours as needed for up to 30 days for wheezing or shortness of breath (if you can't catch your breath). 05/28/18 06/27/18  Elodia Florence., MD  ARIPiprazole (ABILIFY) 5 MG tablet Take 1 tablet (5 mg total) by mouth daily for 30 days. 05/28/18 06/27/18  Elodia Florence., MD  Blood Glucose Monitoring Suppl (ACCU-CHEK AVIVA) device Use as instructed 10/27/17 10/27/18  Clent Demark, PA-C  carvedilol (COREG) 3.125 MG tablet Take 1 tablet (3.125 mg total) by mouth 2 (two) times daily with a meal for 30 days. 05/28/18 06/27/18  Elodia Florence., MD  escitalopram (LEXAPRO) 20 MG tablet Take 1 tablet (20 mg total) by mouth daily for 30 days. 05/28/18 06/27/18  Elodia Florence., MD  glucose blood (ACCU-CHEK AVIVA) test strip 1 each by Other route as needed for other. Use as instructed 10/27/17   Clent Demark, PA-C  Lancets [facility]) lancets 1 each by Other route 2 (two) times daily. Use as instructed 10/27/17   Clent Demark, PA-C  loperamide (IMODIUM) 2 MG capsule Take 2 every 6 hours if continued diarrhea  Patient taking differently: Take 2-4 mg by mouth See admin instructions. Take 2 mg by mouth as needed for diarrhea or loose stools and 4 mg every 6 hours if diarrhea continues 04/10/18   [name], MD  lovastatin (MEVACOR) 20 MG tablet Take 1 tablet (20 mg total) by mouth at bedtime for 30 days. 05/28/18 06/27/18  Elodia Florence., MD  metFORMIN (GLUCOPHAGE) 1000 MG tablet Take 1 tablet (1,000 mg total) by mouth 2 (two) times daily with a meal. 10/27/17   Clent Demark, PA-C  mometasone-formoterol [facility]) 100-5 MCG/ACT AERO Inhale 2 puffs into the lungs 2 (two) times daily for 30 days. 05/28/18 06/27/18  Elodia Florence., MD  nicotine (NICODERM CQ - DOSED IN MG/24 HOURS) 14 mg/24hr patch Place 1 patch (14 mg total) onto the skin  daily. 05/29/18   Elodia Florence., MD  OXYGEN Inhale 3 L into the lungs continuous.     [provider]  tiotropium (SPIRIVA) 18 MCG inhalation capsule Place 1 capsule (18 mcg total) into inhaler and inhale daily. 04/06/18   [name], MD    Physical Exam: Vitals:   06/12/18 2142 06/12/18 2145 06/12/18 2200 06/12/18 2300  BP:   124/84 111/75  Pulse:   91 78  Resp:   (!) 23 (!) 23  Temp:  98.5 F (36.9 C)    SpO2:   100% 100%  Weight: 65 kg     Height: 5\' 11"  (1.803 m)       Constitutional: Not in acute distress, calm  Eyes: PERTLA, lids and conjunctivae normal ENMT: Mucous membranes are moist. Posterior pharynx clear of any exudate or lesions.   Neck: normal, supple, no masses, no thyromegaly Respiratory: Breath sounds diminished bilaterally with prolonged expiratory phase and wheezing. Tachypnea. No pallor or cyanosis.  Cardiovascular: S1 & S2 heard, regular rate and rhythm. No extremity edema.   Abdomen: No distension, no tenderness, soft. Bowel sounds active.  Musculoskeletal: no clubbing / cyanosis. No joint deformity upper and lower extremities.   Skin: no significant rashes, lesions, ulcers. Warm, dry, well-perfused.  Neurologic: CN 2-12 grossly intact. Resting tremor. Sensation to light touch intact. Strength 5/5 in all 4 limbs.  Psychiatric: Alert and oriented to person, place, and situation. Calm, cooperative.    Labs on Admission: I have personally reviewed following labs and imaging studies  CBC: Recent Labs  Lab 06/12/18 2203  WBC 5.7  NEUTROABS 4.6  HGB 12.3*  HCT 38.5*  MCV 98.2  PLT 256   Basic Metabolic Panel: Recent Labs  Lab 06/12/18 2203  NA 133*  K 4.3  CL 83*  CO2 39*  GLUCOSE 102*  BUN 6*  CREATININE 0.65  CALCIUM 8.6*   GFR: Estimated Creatinine Clearance: 86.9 mL/min (by C-G formula based on SCr of 0.65 mg/dL). Liver Function Tests: No results for input(s): AST, ALT, ALKPHOS, BILITOT, PROT, ALBUMIN in the last 168 hours. No results for input(s): LIPASE, AMYLASE in the last 168 hours. No results for input(s): AMMONIA in the last 168 hours. Coagulation Profile: No results for input(s): INR, PROTIME in the last 168 hours. Cardiac Enzymes: Recent Labs  Lab 06/12/18 2203  TROPONINI <0.03   BNP (last 3 results) No results for input(s): PROBNP in the last 8760 hours. HbA1C: No results for input(s): HGBA1C in the last 72 hours. CBG: No results for input(s): GLUCAP in the last 168 hours. Lipid Profile: No results for input(s): CHOL, HDL, LDLCALC, TRIG, CHOLHDL, LDLDIRECT in the last 72 hours. Thyroid Function Tests: No results for input(s): TSH, T4TOTAL, FREET4, T3FREE, THYROIDAB in the last 72 hours. Anemia Panel: No results for input(s): VITAMINB12, FOLATE, FERRITIN, TIBC, IRON, RETICCTPCT in the last 72 hours. Urine analysis:    Component Value Date/Time   COLORURINE STRAW (A) 05/15/2018 1600   APPEARANCEUR CLEAR 05/15/2018 1600   LABSPEC >1.046 (H) 05/15/2018 1600   PHURINE 6.0 05/15/2018 1600   GLUCOSEU NEGATIVE 05/15/2018 1600   HGBUR NEGATIVE 05/15/2018 1600   BILIRUBINUR NEGATIVE 05/15/2018 1600   KETONESUR NEGATIVE 05/15/2018 1600   PROTEINUR  NEGATIVE 05/15/2018 1600   UROBILINOGEN 1.0 01/09/2013 1843   NITRITE NEGATIVE 05/15/2018 1600   LEUKOCYTESUR NEGATIVE 05/15/2018 1600   Sepsis Labs: @LABRCNTIP (procalcitonin:4,lacticidven:4) ) Recent Results (from the past 240 hour(s))  SARS Coronavirus 2 (CEPHEID - Performed in [facility] hospital lab), Hosp Order     Status: None   Collection Time: 06/12/18 10:27 PM  Result Value Ref Range Status   SARS Coronavirus 2 NEGATIVE NEGATIVE Final    Comment: (NOTE) If result is NEGATIVE SARS-CoV-2 target nucleic acids are NOT DETECTED. The SARS-CoV-2 RNA is generally detectable in upper and lower  respiratory specimens during the acute phase of infection. The lowest  concentration of SARS-CoV-2 viral copies this assay can detect is 250  copies / mL. A negative result does not preclude SARS-CoV-2 infection  and should not be used as the sole basis for treatment or other  patient management decisions.  A negative result may occur with  improper specimen collection / handling, submission of specimen other  than nasopharyngeal swab, presence of viral mutation(s) within the  areas targeted by this assay, and inadequate number of viral copies  (<250 copies / mL). A negative result must be combined with clinical  observations, patient history, and epidemiological information. If result is POSITIVE SARS-CoV-2 target nucleic acids are DETECTED. The SARS-CoV-2 RNA is generally detectable in upper and lower  respiratory specimens dur ing the acute phase of infection.  Positive  results are indicative of active infection with SARS-CoV-2.  Clinical  correlation with patient history and other diagnostic information is  necessary to determine patient infection status.  Positive results do  not rule out bacterial infection or co-infection with other viruses. If result is PRESUMPTIVE POSTIVE SARS-CoV-2 nucleic acids MAY BE PRESENT.   A presumptive positive result was obtained on the submitted  specimen  and confirmed on repeat testing.  While 2019 novel coronavirus  (SARS-CoV-2) nucleic acids may be present in the submitted sample  additional confirmatory testing may be necessary for epidemiological  and / or clinical management purposes  to differentiate between  SARS-CoV-2 and other Sarbecovirus currently known to infect humans.  If clinically indicated additional testing with an alternate test  methodology [phone]) is advised. The SARS-CoV-2 RNA is generally  detectable in upper and lower respiratory sp ecimens during the acute  phase of infection. The expected result is Negative. Fact Sheet for Patients:  StrictlyIdeas.no Fact Sheet for Healthcare Providers: BankingDealers.co.za This test is not yet approved or cleared by the [place] FDA and has been authorized for detection and/or diagnosis of SARS-CoV-2 by FDA under an Emergency Use Authorization (EUA).  This EUA will remain in effect (meaning this test can be used) for the duration of the COVID-19 declaration under Section 564(b)(1) of the Act, 21 U.S.C. section 360bbb-3(b)(1), unless the authorization is terminated or revoked sooner. Performed at [facility], [place] [address] [place], [place] [address]      Radiological Exams on Admission: Dg Chest Port 1 View  Result Date: 06/12/2018 CLINICAL DATA:  Shortness of breath EXAM: PORTABLE CHEST 1 VIEW COMPARISON:  05/25/2018 FINDINGS: The lungs are hyperexpanded. There is no pneumothorax. No area of consolidation. No large pleural effusion. Old right-sided rib fractures are noted. There is no acute osseous abnormality. IMPRESSION: No acute cardiopulmonary process. Electronically Signed   By: Constance Holster M.D.   On: 06/12/2018 22:44    EKG: Independently reviewed. Sinus tachycardia (rate 108).   Assessment/Plan   1. COPD with acute exacerbation; chronic hypoxic & hypercarbic respiratory failure  -  Presents with ~1 wk of progressive SOB and wheezing without much change in his chronic cough, and no fever or chills  - CXR is clear, exam consistent with COPD exacerbation  - He was  treated with albuterol, steroids, and magnesium in the ED but remains dyspneic at rest  - Check sputum culture, continue systemic steroid, start azithromycin, continue Dulera, Spiriva, and as-needed albuterol   2. Prostate cancer  - Had been undergoing radiation for this but states that he does not want to continue and plans to discuss this with his urologist    3. Type II DM  - A1c was 7.7% in January  - Hold metformin and use SSI with Novolog while in hospital    4. Hyponatremia  - Serum sodium is 133 on admission in setting of hypovolemia  - Given 500 cc NS in ED  - Repeat chem panel in am    5. Bipolar disorder  - Patient reports depression and anxiety have been worse in recent weeks which he attributes to poor health and frequent hospitalizations but he denies any SI, HI, or hallucinations  - Continue Abilify and Lexapro   6. Hypertension  - BP at goal, continue Coreg    7. Cocaine abuse  - Counseled     PPE: Mask, face shield. Patient wearing mask.  DVT prophylaxis: Lovenox  Code Status: DNR; confirmed with patient  Family Communication: Discussed with patient  Consults called: None Admission status: Observation     Vianne Bulls, MD [facility] Pager [phone]  If 7PM-7AM, please contact night-coverage [email or web address] Password [facility]  06/12/2018, 11:53 PM

## 2018-06-12 NOTE — ED Notes (Addendum)
Asked pt if he had any family to be contacted, he said no

## 2018-06-13 DIAGNOSIS — I10 Essential (primary) hypertension: Secondary | ICD-10-CM | POA: Diagnosis present

## 2018-06-13 DIAGNOSIS — F419 Anxiety disorder, unspecified: Secondary | ICD-10-CM | POA: Diagnosis present

## 2018-06-13 DIAGNOSIS — E119 Type 2 diabetes mellitus without complications: Secondary | ICD-10-CM | POA: Diagnosis not present

## 2018-06-13 DIAGNOSIS — J9612 Chronic respiratory failure with hypercapnia: Secondary | ICD-10-CM | POA: Diagnosis not present

## 2018-06-13 DIAGNOSIS — Z87891 Personal history of nicotine dependence: Secondary | ICD-10-CM | POA: Diagnosis not present

## 2018-06-13 DIAGNOSIS — Z8701 Personal history of pneumonia (recurrent): Secondary | ICD-10-CM | POA: Diagnosis not present

## 2018-06-13 DIAGNOSIS — E871 Hypo-osmolality and hyponatremia: Secondary | ICD-10-CM | POA: Diagnosis present

## 2018-06-13 DIAGNOSIS — F319 Bipolar disorder, unspecified: Secondary | ICD-10-CM | POA: Diagnosis not present

## 2018-06-13 DIAGNOSIS — Z7984 Long term (current) use of oral hypoglycemic drugs: Secondary | ICD-10-CM | POA: Diagnosis not present

## 2018-06-13 DIAGNOSIS — R0602 Shortness of breath: Secondary | ICD-10-CM | POA: Diagnosis present

## 2018-06-13 DIAGNOSIS — J9611 Chronic respiratory failure with hypoxia: Secondary | ICD-10-CM | POA: Diagnosis not present

## 2018-06-13 DIAGNOSIS — C61 Malignant neoplasm of prostate: Secondary | ICD-10-CM | POA: Diagnosis not present

## 2018-06-13 DIAGNOSIS — Z9049 Acquired absence of other specified parts of digestive tract: Secondary | ICD-10-CM | POA: Diagnosis not present

## 2018-06-13 DIAGNOSIS — Z8249 Family history of ischemic heart disease and other diseases of the circulatory system: Secondary | ICD-10-CM | POA: Diagnosis not present

## 2018-06-13 DIAGNOSIS — J441 Chronic obstructive pulmonary disease with (acute) exacerbation: Secondary | ICD-10-CM | POA: Diagnosis not present

## 2018-06-13 DIAGNOSIS — F1410 Cocaine abuse, uncomplicated: Secondary | ICD-10-CM | POA: Diagnosis present

## 2018-06-13 DIAGNOSIS — Z515 Encounter for palliative care: Secondary | ICD-10-CM | POA: Diagnosis not present

## 2018-06-13 DIAGNOSIS — E861 Hypovolemia: Secondary | ICD-10-CM | POA: Diagnosis present

## 2018-06-13 DIAGNOSIS — J439 Emphysema, unspecified: Secondary | ICD-10-CM | POA: Diagnosis present

## 2018-06-13 DIAGNOSIS — Z66 Do not resuscitate: Secondary | ICD-10-CM | POA: Diagnosis not present

## 2018-06-13 DIAGNOSIS — Z933 Colostomy status: Secondary | ICD-10-CM | POA: Diagnosis not present

## 2018-06-13 DIAGNOSIS — Z85038 Personal history of other malignant neoplasm of large intestine: Secondary | ICD-10-CM | POA: Diagnosis not present

## 2018-06-13 DIAGNOSIS — Z9981 Dependence on supplemental oxygen: Secondary | ICD-10-CM | POA: Diagnosis not present

## 2018-06-13 DIAGNOSIS — Z1159 Encounter for screening for other viral diseases: Secondary | ICD-10-CM | POA: Diagnosis not present

## 2018-06-13 DIAGNOSIS — Z885 Allergy status to narcotic agent status: Secondary | ICD-10-CM | POA: Diagnosis not present

## 2018-06-13 DIAGNOSIS — Z79899 Other long term (current) drug therapy: Secondary | ICD-10-CM | POA: Diagnosis not present

## 2018-06-13 LAB — BASIC METABOLIC PANEL
Anion gap: 8 (ref 5–15)
BUN: 8 mg/dL (ref 8–23)
CO2: 37 mmol/L — ABNORMAL HIGH (ref 22–32)
Calcium: 8.2 mg/dL — ABNORMAL LOW (ref 8.9–10.3)
Chloride: 90 mmol/L — ABNORMAL LOW (ref 98–111)
Creatinine, Ser: 0.53 mg/dL — ABNORMAL LOW (ref 0.61–1.24)
GFR calc Af Amer: >60 mL/min (ref >60)
GFR calc non Af Amer: >60 mL/min (ref >60)
Glucose, Bld: 214 mg/dL — ABNORMAL HIGH (ref 70–99)
Potassium: 4.3 mmol/L (ref 3.5–5.1)
Sodium: 135 mmol/L (ref 135–145)

## 2018-06-13 LAB — CBC WITH DIFFERENTIAL/PLATELET
Abs Immature Granulocytes: 0.04 10*3/uL (ref 0.00–0.07)
Basophils Absolute: 0 10*3/uL (ref 0.0–0.1)
Basophils Relative: 0 %
Eosinophils Absolute: 0 10*3/uL (ref 0.0–0.5)
Eosinophils Relative: 0 %
HCT: 36.6 % — ABNORMAL LOW (ref 39.0–52.0)
Hemoglobin: 11.6 g/dL — ABNORMAL LOW (ref 13.0–17.0)
Immature Granulocytes: 1 %
Lymphocytes Relative: 2 %
Lymphs Abs: 0.1 10*3/uL — ABNORMAL LOW (ref 0.7–4.0)
MCH: 30.6 pg (ref 26.0–34.0)
MCHC: 31.7 g/dL (ref 30.0–36.0)
MCV: 96.6 fL (ref 80.0–100.0)
Monocytes Absolute: 0.1 10*3/uL (ref 0.1–1.0)
Monocytes Relative: 2 %
Neutro Abs: 3.7 10*3/uL (ref 1.7–7.7)
Neutrophils Relative %: 95 %
Platelets: 177 10*3/uL (ref 150–400)
RBC: 3.79 MIL/uL — ABNORMAL LOW (ref 4.22–5.81)
RDW: 13.2 % (ref 11.5–15.5)
WBC: 3.9 10*3/uL — ABNORMAL LOW (ref 4.0–10.5)
nRBC: 0 % (ref 0.0–0.2)

## 2018-06-13 LAB — RAPID URINE DRUG SCREEN, HOSP PERFORMED
Amphetamines: NOT DETECTED
Barbiturates: NOT DETECTED
Benzodiazepines: NOT DETECTED
Cocaine: NOT DETECTED
Opiates: NOT DETECTED
Tetrahydrocannabinol: NOT DETECTED

## 2018-06-13 LAB — GLUCOSE, CAPILLARY
Glucose-Capillary: 141 mg/dL — ABNORMAL HIGH (ref 70–99)
Glucose-Capillary: 157 mg/dL — ABNORMAL HIGH (ref 70–99)
Glucose-Capillary: 223 mg/dL — ABNORMAL HIGH (ref 70–99)
Glucose-Capillary: 262 mg/dL — ABNORMAL HIGH (ref 70–99)
Glucose-Capillary: 97 mg/dL (ref 70–99)

## 2018-06-13 MED ORDER — UMECLIDINIUM BROMIDE 62.5 MCG/INH IN AEPB
1.0000 | INHALATION_SPRAY | Freq: Every day | RESPIRATORY_TRACT | Status: DC
  Administered 2018-06-13 – 2018-06-16 (×4): 1 via RESPIRATORY_TRACT
  Filled 2018-06-13: qty 7

## 2018-06-13 MED ORDER — INSULIN ASPART 100 UNIT/ML ~~LOC~~ SOLN
0.0000 [IU] | Freq: Every day | SUBCUTANEOUS | Status: DC
  Administered 2018-06-14: 2 [IU] via SUBCUTANEOUS

## 2018-06-13 MED ORDER — SODIUM CHLORIDE 0.9 % IV SOLN
500.0000 mg | INTRAVENOUS | Status: DC
  Administered 2018-06-13 – 2018-06-15 (×3): 500 mg via INTRAVENOUS
  Filled 2018-06-13 (×3): qty 500

## 2018-06-13 MED ORDER — METHYLPREDNISOLONE SODIUM SUCC 40 MG IJ SOLR
40.0000 mg | Freq: Three times a day (TID) | INTRAMUSCULAR | Status: DC
  Administered 2018-06-13 – 2018-06-16 (×11): 40 mg via INTRAVENOUS
  Filled 2018-06-13 (×11): qty 1

## 2018-06-13 MED ORDER — GUAIFENESIN-DM 100-10 MG/5ML PO SYRP
5.0000 mL | ORAL_SOLUTION | ORAL | Status: DC | PRN
  Administered 2018-06-13 – 2018-06-15 (×4): 5 mL via ORAL
  Filled 2018-06-13 (×5): qty 5

## 2018-06-13 MED ORDER — INSULIN ASPART 100 UNIT/ML ~~LOC~~ SOLN
0.0000 [IU] | Freq: Three times a day (TID) | SUBCUTANEOUS | Status: DC
  Administered 2018-06-14: 3 [IU] via SUBCUTANEOUS
  Administered 2018-06-15: 4 [IU] via SUBCUTANEOUS
  Administered 2018-06-15 (×2): 3 [IU] via SUBCUTANEOUS
  Administered 2018-06-16: 4 [IU] via SUBCUTANEOUS
  Administered 2018-06-16: 3 [IU] via SUBCUTANEOUS

## 2018-06-13 NOTE — Progress Notes (Signed)
Progress Note    Taylor [name]  OJJ:[id number] DOB: Sep 29, 1955  DOA: 06/12/2018 PCP: Clent Demark, PA-C    Brief Narrative:     Medical records reviewed and are as summarized below:  Taylor [name] is an 63 y.o. 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  Patient was admitted to the hospital earlier this month for COPD exacerbation, reports that he continued to do well back at home for a couple weeks from a respiratory perspective, but now over the past several days to a week, he has had progressive worsening in his dyspnea, wheezing, but not much change in his chronic cough and no fevers or chills.  Patient reports that he ran out of his albuterol recently.  Assessment/Plan:   Principal Problem:   COPD with acute exacerbation (HCC) Active Problems:   Bipolar 1 disorder (HCC)   COPD exacerbation (HCC)   Prostate cancer ([place])   Chronic respiratory failure with hypoxia and hypercapnia (HCC)   Essential hypertension   Hyponatremia   Cocaine abuse ([place])   Diabetes mellitus type II, non insulin dependent ([place])  COPD with acute exacerbation; chronic hypoxic & hypercarbic respiratory failure  - CXR is clear, exam consistent with COPD exacerbation  - He was treated with albuterol, steroids, and magnesium in the ED but remains dyspneic at rest  - Check sputum culture, continue systemic steroid, start azithromycin, continue Dulera, Spiriva, and as-needed albuterol   Prostate cancer  - Had been undergoing radiation for this but states that he does not want to continue and plans to discuss this with his urologist     Type II DM  - A1c was 7.7% in January  -  Hold metformin and use SSI with Novolog while in hospital  -- suspect while getting steroids, his blood sugar will be higher   Hyponatremia  - Serum sodium is 133 on admission in setting of hypovolemia  - improved after hydration    Bipolar disorder  - Patient reports depression and anxiety have been worse in recent weeks which he attributes to poor health and frequent hospitalizations but he denies any SI, HI, or hallucinations  - Continue Abilify and Lexapro   Hypertension  - continue Coreg    Patient has had multiple admissions recently-- does not seem to be managing well at home-- will discuss palliative care consult with patient in the AM-- ? Hospice-- last had a consult in August of 2019  Family Communication/Anticipated D/C date and plan/Code Status   DVT prophylaxis: Lovenox ordered. Code Status: dnr  Family Communication:  Disposition Plan: 24-48 more hours   Medical Consultants:    None.     Subjective:   Ran out of inhaler Feels short of breath still  Objective:    Vitals:   06/13/18 0029 06/13/18 0908 06/13/18 0910 06/13/18 1144  BP:    123/85  Pulse:    85  Resp:    18  Temp:    98.4 F (36.9 C)  TempSrc:    Oral  SpO2:  97% 97% 100%  Weight: 64.8 kg     Height: 5\' 11"  (1.803 m)       Intake/Output Summary (Last 24 hours) at 06/13/2018 1206 Last data filed at 06/13/2018 1129 Gross per  24 hour  Intake 1010 ml  Output 850 ml  Net 160 ml   Filed Weights   06/12/18 2142 06/13/18 0029  Weight: 65 kg 64.8 kg    Exam: In bed, frail appearing Diminished breath sounds with wheezing +BS, soft, NT rrr No LE edema   Data Reviewed:   I have personally reviewed following labs and imaging studies:  Labs: Labs show the following:   Basic Metabolic Panel: Recent Labs  Lab 06/12/18 2203 06/13/18 0441  NA 133* 135  K 4.3 4.3  CL 83* 90*  CO2 39* 37*  GLUCOSE 102* 214*  BUN 6* 8  CREATININE 0.65 0.53*  CALCIUM 8.6* 8.2*   GFR  Estimated Creatinine Clearance: 86.6 mL/min (A) (by C-G formula based on SCr of 0.53 mg/dL (L)). Liver Function Tests: No results for input(s): AST, ALT, ALKPHOS, BILITOT, PROT, ALBUMIN in the last 168 hours. No results for input(s): LIPASE, AMYLASE in the last 168 hours. No results for input(s): AMMONIA in the last 168 hours. Coagulation profile No results for input(s): INR, PROTIME in the last 168 hours.  CBC: Recent Labs  Lab 06/12/18 2203 06/13/18 0441  WBC 5.7 3.9*  NEUTROABS 4.6 3.7  HGB 12.3* 11.6*  HCT 38.5* 36.6*  MCV 98.2 96.6  PLT 190 177   Cardiac Enzymes: Recent Labs  Lab 06/12/18 2203  TROPONINI <0.03   BNP (last 3 results) No results for input(s): PROBNP in the last 8760 hours. CBG: Recent Labs  Lab 06/13/18 0042 06/13/18 0603  GLUCAP 223* 157*   D-Dimer: No results for input(s): DDIMER in the last 72 hours. Hgb A1c: No results for input(s): HGBA1C in the last 72 hours. Lipid Profile: No results for input(s): CHOL, HDL, LDLCALC, TRIG, CHOLHDL, LDLDIRECT in the last 72 hours. Thyroid function studies: No results for input(s): TSH, T4TOTAL, T3FREE, THYROIDAB in the last 72 hours.  Invalid input(s): FREET3 Anemia work up: No results for input(s): VITAMINB12, FOLATE, FERRITIN, TIBC, IRON, RETICCTPCT in the last 72 hours. Sepsis Labs: Recent Labs  Lab 06/12/18 2203 06/13/18 0441  WBC 5.7 3.9*    Microbiology Recent Results (from the past 240 hour(s))  SARS Coronavirus 2 (CEPHEID - Performed in [place] hospital lab), Hosp Order     Status: None   Collection Time: 06/12/18 10:27 PM  Result Value Ref Range Status   SARS Coronavirus 2 NEGATIVE NEGATIVE Final    Comment: (NOTE) If result is NEGATIVE SARS-CoV-2 target nucleic acids are NOT DETECTED. The SARS-CoV-2 RNA is generally detectable in upper and lower  respiratory specimens during the acute phase of infection. The lowest  concentration of SARS-CoV-2 viral copies this assay can detect  is 250  copies / mL. A negative result does not preclude SARS-CoV-2 infection  and should not be used as the sole basis for treatment or other  patient management decisions.  A negative result may occur with  improper specimen collection / handling, submission of specimen other  than nasopharyngeal swab, presence of viral mutation(s) within the  areas targeted by this assay, and inadequate number of viral copies  (<250 copies / mL). A negative result must be combined with clinical  observations, patient history, and epidemiological information. If result is POSITIVE SARS-CoV-2 target nucleic acids are DETECTED. The SARS-CoV-2 RNA is generally detectable in upper and lower  respiratory specimens dur ing the acute phase of infection.  Positive  results are indicative of active infection with SARS-CoV-2.  Clinical  correlation with patient history and other diagnostic  information is  necessary to determine patient infection status.  Positive results do  not rule out bacterial infection or co-infection with other viruses. If result is PRESUMPTIVE POSTIVE SARS-CoV-2 nucleic acids MAY BE PRESENT.   A presumptive positive result was obtained on the submitted specimen  and confirmed on repeat testing.  While 2019 novel coronavirus  (SARS-CoV-2) nucleic acids may be present in the submitted sample  additional confirmatory testing may be necessary for epidemiological  and / or clinical management purposes  to differentiate between  SARS-CoV-2 and other Sarbecovirus currently known to infect humans.  If clinically indicated additional testing with an alternate test  methodology [phone]) is advised. The SARS-CoV-2 RNA is generally  detectable in upper and lower respiratory sp ecimens during the acute  phase of infection. The expected result is Negative. Fact Sheet for Patients:  StrictlyIdeas.no Fact Sheet for Healthcare Providers:  BankingDealers.co.za This test is not yet approved or cleared by the [place] FDA and has been authorized for detection and/or diagnosis of SARS-CoV-2 by FDA under an Emergency Use Authorization (EUA).  This EUA will remain in effect (meaning this test can be used) for the duration of the COVID-19 declaration under Section 564(b)(1) of the Act, 21 U.S.C. section 360bbb-3(b)(1), unless the authorization is terminated or revoked sooner. Performed at [facility], [place] [address], [place], [place] [address]     Procedures and diagnostic studies:  Dg Chest Port 1 View  Result Date: 06/12/2018 CLINICAL DATA:  Shortness of breath EXAM: PORTABLE CHEST 1 VIEW COMPARISON:  05/25/2018 FINDINGS: The lungs are hyperexpanded. There is no pneumothorax. No area of consolidation. No large pleural effusion. Old right-sided rib fractures are noted. There is no acute osseous abnormality. IMPRESSION: No acute cardiopulmonary process. Electronically Signed   By: Constance Holster M.D.   On: 06/12/2018 22:44    Medications:   . ARIPiprazole  5 mg Oral Daily  . carvedilol  3.125 mg Oral BID WC  . enoxaparin (LOVENOX) injection  40 mg Subcutaneous Q24H  . escitalopram  20 mg Oral Daily  . insulin aspart  0-5 Units Subcutaneous QHS  . insulin aspart  0-9 Units Subcutaneous TID WC  . methylPREDNISolone (SOLU-MEDROL) injection  40 mg Intravenous Q8H  . mometasone-formoterol  2 puff Inhalation BID  . umeclidinium bromide  1 puff Inhalation Daily   Continuous Infusions: . azithromycin 500 mg (06/13/18 0119)     LOS: 0 days   Geradine Girt  [facility]   How to contact the [facility] Attending or Consulting provider [place] or covering provider during after hours [place], for this patient?  1. Check the care team in [facility] and look for a) attending/consulting TRH provider listed and b) the [facility] team listed 2. Log into [email or web address] and use [place]'s universal password  to access. If you do not have the password, please contact the hospital operator. 3. Locate the [facility] provider you are looking for under [facility] and page to a number that you can be directly reached. 4. If you still have difficulty reaching the provider, please page the [facility] (Director on Call) for the Hospitalists listed on amion for assistance.  06/13/2018, 12:06 PM

## 2018-06-13 NOTE — Progress Notes (Addendum)
Pt c/o of SOB. Oxygen saturation 95%  On 3 L. HR fluctuates between 90-115 BPM. Educated patient about proper breathing. Paged MD Eliseo Squires and informed her. Contacted RT to come and give breathing treatment. Patient currently resting in the bed in no acute distress. Awaiting on possible orders. Primary nurse informed.

## 2018-06-13 NOTE — Progress Notes (Signed)
Received pt alert and oriented x4. Pt on 2L O2 per nasal canula. Pt with mild SHOB at this time. No wheezing per bilateral lung sounds. Will monitor pt.

## 2018-06-14 LAB — GLUCOSE, CAPILLARY
Glucose-Capillary: 121 mg/dL — ABNORMAL HIGH (ref 70–99)
Glucose-Capillary: 133 mg/dL — ABNORMAL HIGH (ref 70–99)
Glucose-Capillary: 117 mg/dL — ABNORMAL HIGH (ref 70–99)
Glucose-Capillary: 103 mg/dL — ABNORMAL HIGH (ref 70–99)
Glucose-Capillary: 231 mg/dL — ABNORMAL HIGH (ref 70–99)

## 2018-06-14 NOTE — Progress Notes (Signed)
Progress Note    Taylor [name]  GLO:[id number] DOB: 08/31/1955  DOA: 06/12/2018 PCP: Taylor Demark, PA-C    Brief Narrative:     Medical records reviewed and are as summarized below:  Taylor [name] is an 63 y.o. 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  Patient was admitted to the hospital earlier this month for COPD exacerbation, reports that he continued to do well back at home for a couple weeks from a respiratory perspective, but now over the past several days to a week, he has had progressive worsening in his dyspnea, wheezing, but not much change in his chronic cough and no fevers or chills.  Patient reports that he ran out of his albuterol recently.  Assessment/Plan:   Principal Problem:   COPD with acute exacerbation (HCC) Active Problems:   Bipolar 1 disorder (HCC)   COPD exacerbation (HCC)   Prostate cancer ([name])   Chronic respiratory failure with hypoxia and hypercapnia (HCC)   Essential hypertension   Hyponatremia   Cocaine abuse ([name])   Diabetes mellitus type II, non insulin dependent ([name])  COPD with acute exacerbation; chronic hypoxic & hypercarbic respiratory failure  - CXR is clear, exam consistent with COPD exacerbation  - He was treated with albuterol, steroids, and magnesium in the ED but remains dyspneic at rest  - Check sputum culture, continue systemic steroid, start azithromycin, continue Dulera, Spiriva, and as-needed albuterol  -home to change steroids to PO in AM for an outpatient taper  Prostate cancer  - Had been undergoing radiation for this but states that he does not want to continue and plans to discuss this with his  urologist     Type II DM  - A1c was 7.7% in January  - Hold metformin and use SSI with Novolog while in hospital  -- suspect while getting steroids, his blood sugar will be higher   Hyponatremia  - Serum sodium is 133 on admission in setting of hypovolemia  - improved after hydration    Bipolar disorder  - Patient reports depression and anxiety have been worse in recent weeks which he attributes to poor health and frequent hospitalizations but he denies any SI, HI, or hallucinations  - Continue Abilify and Lexapro   Hypertension  - continue Coreg      Family Communication/Anticipated D/C date and plan/Code Status   DVT prophylaxis: Lovenox ordered. Code Status: dnr  Family Communication:  Disposition Plan: home in AM?   Medical Consultants:    None.     Subjective:   Breathing slowly improving-- does not think he is back to his baseline yet Still gets very winded getting up  Objective:    Vitals:   06/14/18 0453 06/14/18 0828 06/14/18 0829 06/14/18 1136  BP: 126/90   130/90  Pulse: 85  90 77  Resp: 20  (!) 22 18  Temp: 97.7 F (36.5 C)   98.4 F (36.9 C)  TempSrc: Oral   Oral  SpO2: 99% 94% 94% 98%  Weight: 65.8 kg     Height:        Intake/Output Summary (Last 24 hours) at 06/14/2018 1137 Last data filed at 06/14/2018 1884 Gross per 24 hour  Intake 1450 ml  Output 2765 ml  Net -1315 ml   Filed Weights   06/12/18 2142 06/13/18 0029 06/14/18 0453  Weight: 65 kg 64.8 kg 65.8 kg    Exam: In bed, on O2 NAD Appears comfortable rrr Not moving much air but no wheezing A+Ox3   Data Reviewed:   I have personally reviewed following labs and imaging studies:  Labs: Labs show the following:   Basic Metabolic Panel: Recent Labs  Lab 06/12/18 2203 06/13/18 0441  NA 133* 135  K 4.3 4.3  CL 83* 90*  CO2 39* 37*  GLUCOSE 102* 214*  BUN 6* 8  CREATININE 0.65 0.53*  CALCIUM 8.6* 8.2*   GFR Estimated Creatinine Clearance: 88 mL/min (A)  (by C-G formula based on SCr of 0.53 mg/dL (L)). Liver Function Tests: No results for input(s): AST, ALT, ALKPHOS, BILITOT, PROT, ALBUMIN in the last 168 hours. No results for input(s): LIPASE, AMYLASE in the last 168 hours. No results for input(s): AMMONIA in the last 168 hours. Coagulation profile No results for input(s): INR, PROTIME in the last 168 hours.  CBC: Recent Labs  Lab 06/12/18 2203 06/13/18 0441  WBC 5.7 3.9*  NEUTROABS 4.6 3.7  HGB 12.3* 11.6*  HCT 38.5* 36.6*  MCV 98.2 96.6  PLT 190 177   Cardiac Enzymes: Recent Labs  Lab 06/12/18 2203  TROPONINI <0.03   BNP (last 3 results) No results for input(s): PROBNP in the last 8760 hours. CBG: Recent Labs  Lab 06/13/18 1126 06/13/18 1604 06/13/18 2116 06/14/18 0544 06/14/18 0619  GLUCAP 262* 97 141* 133* 121*   D-Dimer: No results for input(s): DDIMER in the last 72 hours. Hgb A1c: No results for input(s): HGBA1C in the last 72 hours. Lipid Profile: No results for input(s): CHOL, HDL, LDLCALC, TRIG, CHOLHDL, LDLDIRECT in the last 72 hours. Thyroid function studies: No results for input(s): TSH, T4TOTAL, T3FREE, THYROIDAB in the last 72 hours.  Invalid input(s): FREET3 Anemia work up: No results for input(s): VITAMINB12, FOLATE, FERRITIN, TIBC, IRON, RETICCTPCT in the last 72 hours. Sepsis Labs: Recent Labs  Lab 06/12/18 2203 06/13/18 0441  WBC 5.7 3.9*    Microbiology Recent Results (from the past 240 hour(s))  SARS Coronavirus 2 (CEPHEID - Performed in [place] hospital lab), Hosp Order     Status: None   Collection Time: 06/12/18 10:27 PM  Result Value Ref Range Status   SARS Coronavirus 2 NEGATIVE NEGATIVE Final    Comment: (NOTE) If result is NEGATIVE SARS-CoV-2 target nucleic acids are NOT DETECTED. The SARS-CoV-2 RNA is generally detectable in upper and lower  respiratory specimens during the acute phase of infection. The lowest  concentration of SARS-CoV-2 viral copies this assay  can detect is 250  copies / mL. A negative result does not preclude SARS-CoV-2 infection  and should not be used as the sole basis for treatment or other  patient management decisions.  A negative result may occur with  improper specimen collection / handling, submission of specimen other  than nasopharyngeal swab, presence of viral mutation(s) within the  areas targeted by this assay, and inadequate number of viral copies  (<250 copies / mL). A negative result must be combined with clinical  observations, patient history, and epidemiological information. If result is POSITIVE SARS-CoV-2 target nucleic acids are DETECTED. The SARS-CoV-2 RNA is generally detectable in upper and lower  respiratory specimens dur ing the acute phase of infection.  Positive  results are indicative of active infection with SARS-CoV-2.  Clinical  correlation with patient history and  other diagnostic information is  necessary to determine patient infection status.  Positive results do  not rule out bacterial infection or co-infection with other viruses. If result is PRESUMPTIVE POSTIVE SARS-CoV-2 nucleic acids MAY BE PRESENT.   A presumptive positive result was obtained on the submitted specimen  and confirmed on repeat testing.  While 2019 novel coronavirus  (SARS-CoV-2) nucleic acids may be present in the submitted sample  additional confirmatory testing may be necessary for epidemiological  and / or clinical management purposes  to differentiate between  SARS-CoV-2 and other Sarbecovirus currently known to infect humans.  If clinically indicated additional testing with an alternate test  methodology [phone]) is advised. The SARS-CoV-2 RNA is generally  detectable in upper and lower respiratory sp ecimens during the acute  phase of infection. The expected result is Negative. Fact Sheet for Patients:  StrictlyIdeas.no Fact Sheet for Healthcare Providers:  BankingDealers.co.za This test is not yet approved or cleared by the [place] FDA and has been authorized for detection and/or diagnosis of SARS-CoV-2 by FDA under an Emergency Use Authorization (EUA).  This EUA will remain in effect (meaning this test can be used) for the duration of the COVID-19 declaration under Section 564(b)(1) of the Act, 21 U.S.C. section 360bbb-3(b)(1), unless the authorization is terminated or revoked sooner. Performed at [facility], [place] [address] [place], [place] [address]     Procedures and diagnostic studies:  Dg Chest Port 1 View  Result Date: 06/12/2018 CLINICAL DATA:  Shortness of breath EXAM: PORTABLE CHEST 1 VIEW COMPARISON:  05/25/2018 FINDINGS: The lungs are hyperexpanded. There is no pneumothorax. No area of consolidation. No large pleural effusion. Old right-sided rib fractures are noted. There is no acute osseous abnormality. IMPRESSION: No acute cardiopulmonary process. Electronically Signed   By: Constance Holster M.D.   On: 06/12/2018 22:44    Medications:   . ARIPiprazole  5 mg Oral Daily  . carvedilol  3.125 mg Oral BID WC  . enoxaparin (LOVENOX) injection  40 mg Subcutaneous Q24H  . escitalopram  20 mg Oral Daily  . insulin aspart  0-20 Units Subcutaneous TID WC  . insulin aspart  0-5 Units Subcutaneous QHS  . methylPREDNISolone (SOLU-MEDROL) injection  40 mg Intravenous Q8H  . mometasone-formoterol  2 puff Inhalation BID  . umeclidinium bromide  1 puff Inhalation Daily   Continuous Infusions: . azithromycin 500 mg (06/14/18 0030)     LOS: 1 day   Geradine Girt  [facility]   How to contact the [facility] Attending or Consulting provider [place] or covering provider during after hours [place], for this patient?  1. Check the care team in [facility] and look for a) attending/consulting TRH provider listed and b) the [facility] team listed 2. Log into [email or web address] and use [place]'s universal password  to access. If you do not have the password, please contact the hospital operator. 3. Locate the [facility] provider you are looking for under [facility] and page to a number that you can be directly reached. 4. If you still have difficulty reaching the provider, please page the [facility] (Director on Call) for the Hospitalists listed on amion for assistance.  06/14/2018, 11:37 AM

## 2018-06-15 ENCOUNTER — Ambulatory Visit: Payer: No Typology Code available for payment source

## 2018-06-15 DIAGNOSIS — Z66 Do not resuscitate: Secondary | ICD-10-CM

## 2018-06-15 LAB — BASIC METABOLIC PANEL
Anion gap: 6 (ref 5–15)
BUN: 11 mg/dL (ref 8–23)
CO2: 41 mmol/L — ABNORMAL HIGH (ref 22–32)
Calcium: 8.2 mg/dL — ABNORMAL LOW (ref 8.9–10.3)
Chloride: 89 mmol/L — ABNORMAL LOW (ref 98–111)
Creatinine, Ser: 0.43 mg/dL — ABNORMAL LOW (ref 0.61–1.24)
GFR calc Af Amer: >60 mL/min (ref >60)
GFR calc non Af Amer: >60 mL/min (ref >60)
Glucose, Bld: 128 mg/dL — ABNORMAL HIGH (ref 70–99)
Potassium: 4.3 mmol/L (ref 3.5–5.1)
Sodium: 136 mmol/L (ref 135–145)

## 2018-06-15 LAB — GLUCOSE, CAPILLARY
Glucose-Capillary: 121 mg/dL — ABNORMAL HIGH (ref 70–99)
Glucose-Capillary: 131 mg/dL — ABNORMAL HIGH (ref 70–99)
Glucose-Capillary: 170 mg/dL — ABNORMAL HIGH (ref 70–99)
Glucose-Capillary: 142 mg/dL — ABNORMAL HIGH (ref 70–99)

## 2018-06-15 LAB — CBC
HCT: 35.6 % — ABNORMAL LOW (ref 39.0–52.0)
Hemoglobin: 11.5 g/dL — ABNORMAL LOW (ref 13.0–17.0)
MCH: 31.2 pg (ref 26.0–34.0)
MCHC: 32.3 g/dL (ref 30.0–36.0)
MCV: 96.5 fL (ref 80.0–100.0)
Platelets: 191 10*3/uL (ref 150–400)
RBC: 3.69 MIL/uL — ABNORMAL LOW (ref 4.22–5.81)
RDW: 13.1 % (ref 11.5–15.5)
WBC: 6.5 10*3/uL (ref 4.0–10.5)
nRBC: 0 % (ref 0.0–0.2)

## 2018-06-15 MED ORDER — AZITHROMYCIN 500 MG PO TABS
500.0000 mg | ORAL_TABLET | Freq: Every day | ORAL | Status: DC
  Administered 2018-06-16: 500 mg via ORAL
  Filled 2018-06-15: qty 1

## 2018-06-15 MED ORDER — TAMSULOSIN HCL 0.4 MG PO CAPS
0.4000 mg | ORAL_CAPSULE | Freq: Every day | ORAL | Status: DC
  Administered 2018-06-15 – 2018-06-16 (×2): 0.4 mg via ORAL
  Filled 2018-06-15 (×2): qty 1

## 2018-06-15 NOTE — Plan of Care (Signed)
  Problem: Education: Goal: Knowledge of General Education information will improve Description Including pain rating scale, medication(s)/side effects and non-pharmacologic comfort measures Outcome: Progressing   Problem: Health Behavior/Discharge Planning: Goal: Ability to manage health-related needs will improve Outcome: Progressing   Problem: Clinical Measurements: Goal: Ability to maintain clinical measurements within normal limits will improve Outcome: Progressing Goal: Will remain free from infection Outcome: Progressing Goal: Diagnostic test results will improve Outcome: Progressing Goal: Respiratory complications will improve Outcome: Progressing Goal: Cardiovascular complication will be avoided Outcome: Progressing   Problem: Activity: Goal: Risk for activity intolerance will decrease Outcome: Progressing   Problem: Nutrition: Goal: Adequate nutrition will be maintained Outcome: Progressing   Problem: Coping: Goal: Level of anxiety will decrease Outcome: Progressing   Problem: Elimination: Goal: Will not experience complications related to bowel motility Outcome: Progressing Goal: Will not experience complications related to urinary retention Outcome: Progressing   Problem: Pain Managment: Goal: General experience of comfort will improve Outcome: Progressing   Problem: Safety: Goal: Ability to remain free from injury will improve Outcome: Progressing   Problem: Skin Integrity: Goal: Risk for impaired skin integrity will decrease Outcome: Progressing   Problem: Education: Goal: Knowledge of disease or condition will improve Outcome: Progressing Goal: Knowledge of the prescribed therapeutic regimen will improve Outcome: Progressing   Problem: Activity: Goal: Ability to tolerate increased activity will improve Outcome: Progressing Goal: Will verbalize the importance of balancing activity with adequate rest periods Outcome: Progressing   Problem:  Respiratory: Goal: Ability to maintain a clear airway will improve Outcome: Progressing Goal: Levels of oxygenation will improve Outcome: Progressing Goal: Ability to maintain adequate ventilation will improve Outcome: Progressing   

## 2018-06-15 NOTE — Progress Notes (Signed)
Progress Note    Taylor [name]  UUV:[id number] DOB: Jan 08, 1956  DOA: 06/12/2018 PCP: Clent Demark, PA-C    Brief Narrative:     Medical records reviewed and are as summarized below:  Taylor [name] is an 63 y.o. 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  Patient was admitted to the hospital earlier this month for COPD exacerbation, reports that he continued to do well back at home for a couple weeks from a respiratory perspective, but now over the past several days to a week, he has had progressive worsening in his dyspnea, wheezing, but not much change in his chronic cough and no fevers or chills.  Patient reports that he ran out of his albuterol recently.  Assessment/Plan:   Principal Problem:   COPD with acute exacerbation (HCC) Active Problems:   Bipolar 1 disorder (HCC)   COPD exacerbation (HCC)   Prostate cancer ([place])   Chronic respiratory failure with hypoxia and hypercapnia (HCC)   Essential hypertension   Hyponatremia   Cocaine abuse ([place])   Diabetes mellitus type II, non insulin dependent ([place])  COPD with acute exacerbation; chronic hypoxic & hypercarbic respiratory failure  - CXR is clear, exam consistent with COPD exacerbation  - He was treated with albuterol, steroids, and magnesium in the ED but remains dyspneic at rest  - Check sputum culture, continue systemic steroid, start azithromycin, continue Dulera, Spiriva, and as-needed albuterol  -continue IV steroids 1 more day-- hope to change to PO in AM and d/c if safe at his home  Prostate cancer  - Had been undergoing radiation for this but states that he does not want to continue and  plans to discuss this with his urologist     Type II DM  - A1c was 7.7% in January  - Hold metformin and use SSI with Novolog while in hospital  -- suspect while getting steroids, his blood sugar will be higher   Hyponatremia  - Serum sodium is 133 on admission in setting of hypovolemia  - improved after hydration    Bipolar disorder  - Patient reports depression and anxiety have been worse in recent weeks which he attributes to poor health and frequent hospitalizations but he denies any SI, HI, or hallucinations  - Continue Abilify and Lexapro   Hypertension  - continue Coreg    several recent visits to ER/hospital-- has spoken with palliative care in past, will ask them to revisit and help with symptom management  Family Communication/Anticipated D/C date and plan/Code Status   DVT prophylaxis: Lovenox ordered. Code Status: dnr  Family Communication:  Disposition Plan: home in AM?   Medical Consultants:    None.     Subjective:   Says walking to the bathroom is exhausting Roommate reports AC not working at their house and it is very hot  Objective:    Vitals:   06/15/18 0142 06/15/18 0530 06/15/18 0532 06/15/18 0734  BP: 131/89 127/90    Pulse: 81 80    Resp: (!) 22 17    Temp: 98 F (36.7 C) 98.2 F (36.8 C)    TempSrc: Oral Oral    SpO2: 99% 99%  99%  Weight:   64.9 kg   Height:  Intake/Output Summary (Last 24 hours) at 06/15/2018 1014 Last data filed at 06/15/2018 0847 Gross per 24 hour  Intake 2874 ml  Output 2025 ml  Net 849 ml   Filed Weights   06/13/18 0029 06/14/18 0453 06/15/18 0532  Weight: 64.8 kg 65.8 kg 64.9 kg    Exam: Frail appearing Flat affect A+Ox3 Not moving much air No LE edema   Data Reviewed:   I have personally reviewed following labs and imaging studies:  Labs: Labs show the following:   Basic Metabolic Panel: Recent Labs  Lab 06/12/18 2203 06/13/18 0441 06/15/18 0624  NA 133* 135 136  K 4.3  4.3 4.3  CL 83* 90* 89*  CO2 39* 37* 41*  GLUCOSE 102* 214* 128*  BUN 6* 8 11  CREATININE 0.65 0.53* 0.43*  CALCIUM 8.6* 8.2* 8.2*   GFR Estimated Creatinine Clearance: 86.8 mL/min (A) (by C-G formula based on SCr of 0.43 mg/dL (L)). Liver Function Tests: No results for input(s): AST, ALT, ALKPHOS, BILITOT, PROT, ALBUMIN in the last 168 hours. No results for input(s): LIPASE, AMYLASE in the last 168 hours. No results for input(s): AMMONIA in the last 168 hours. Coagulation profile No results for input(s): INR, PROTIME in the last 168 hours.  CBC: Recent Labs  Lab 06/12/18 2203 06/13/18 0441 06/15/18 0624  WBC 5.7 3.9* 6.5  NEUTROABS 4.6 3.7  --   HGB 12.3* 11.6* 11.5*  HCT 38.5* 36.6* 35.6*  MCV 98.2 96.6 96.5  PLT 190 177 191   Cardiac Enzymes: Recent Labs  Lab 06/12/18 2203  TROPONINI <0.03   BNP (last 3 results) No results for input(s): PROBNP in the last 8760 hours. CBG: Recent Labs  Lab 06/14/18 0619 06/14/18 1138 06/14/18 1636 06/14/18 2125 06/15/18 0611  GLUCAP 121* 117* 103* 231* 121*   D-Dimer: No results for input(s): DDIMER in the last 72 hours. Hgb A1c: No results for input(s): HGBA1C in the last 72 hours. Lipid Profile: No results for input(s): CHOL, HDL, LDLCALC, TRIG, CHOLHDL, LDLDIRECT in the last 72 hours. Thyroid function studies: No results for input(s): TSH, T4TOTAL, T3FREE, THYROIDAB in the last 72 hours.  Invalid input(s): FREET3 Anemia work up: No results for input(s): VITAMINB12, FOLATE, FERRITIN, TIBC, IRON, RETICCTPCT in the last 72 hours. Sepsis Labs: Recent Labs  Lab 06/12/18 2203 06/13/18 0441 06/15/18 0624  WBC 5.7 3.9* 6.5    Microbiology Recent Results (from the past 240 hour(s))  SARS Coronavirus 2 (CEPHEID - Performed in [place] hospital lab), Hosp Order     Status: None   Collection Time: 06/12/18 10:27 PM  Result Value Ref Range Status   SARS Coronavirus 2 NEGATIVE NEGATIVE Final    Comment: (NOTE) If  result is NEGATIVE SARS-CoV-2 target nucleic acids are NOT DETECTED. The SARS-CoV-2 RNA is generally detectable in upper and lower  respiratory specimens during the acute phase of infection. The lowest  concentration of SARS-CoV-2 viral copies this assay can detect is 250  copies / mL. A negative result does not preclude SARS-CoV-2 infection  and should not be used as the sole basis for treatment or other  patient management decisions.  A negative result may occur with  improper specimen collection / handling, submission of specimen other  than nasopharyngeal swab, presence of viral mutation(s) within the  areas targeted by this assay, and inadequate number of viral copies  (<250 copies / mL). A negative result must be combined with clinical  observations, patient history, and epidemiological information. If result is  POSITIVE SARS-CoV-2 target nucleic acids are DETECTED. The SARS-CoV-2 RNA is generally detectable in upper and lower  respiratory specimens dur ing the acute phase of infection.  Positive  results are indicative of active infection with SARS-CoV-2.  Clinical  correlation with patient history and other diagnostic information is  necessary to determine patient infection status.  Positive results do  not rule out bacterial infection or co-infection with other viruses. If result is PRESUMPTIVE POSTIVE SARS-CoV-2 nucleic acids MAY BE PRESENT.   A presumptive positive result was obtained on the submitted specimen  and confirmed on repeat testing.  While 2019 novel coronavirus  (SARS-CoV-2) nucleic acids may be present in the submitted sample  additional confirmatory testing may be necessary for epidemiological  and / or clinical management purposes  to differentiate between  SARS-CoV-2 and other Sarbecovirus currently known to infect humans.  If clinically indicated additional testing with an alternate test  methodology [phone]) is advised. The SARS-CoV-2 RNA is generally   detectable in upper and lower respiratory sp ecimens during the acute  phase of infection. The expected result is Negative. Fact Sheet for Patients:  StrictlyIdeas.no Fact Sheet for Healthcare Providers: BankingDealers.co.za This test is not yet approved or cleared by the [place] FDA and has been authorized for detection and/or diagnosis of SARS-CoV-2 by FDA under an Emergency Use Authorization (EUA).  This EUA will remain in effect (meaning this test can be used) for the duration of the COVID-19 declaration under Section 564(b)(1) of the Act, 21 U.S.C. section 360bbb-3(b)(1), unless the authorization is terminated or revoked sooner. Performed at [facility], [place] [address], [place], [place] [address]     Procedures and diagnostic studies:  No results found.  Medications:   . ARIPiprazole  5 mg Oral Daily  . [START ON 06/16/2018] azithromycin  500 mg Oral Daily  . carvedilol  3.125 mg Oral BID WC  . enoxaparin (LOVENOX) injection  40 mg Subcutaneous Q24H  . escitalopram  20 mg Oral Daily  . insulin aspart  0-20 Units Subcutaneous TID WC  . insulin aspart  0-5 Units Subcutaneous QHS  . methylPREDNISolone (SOLU-MEDROL) injection  40 mg Intravenous Q8H  . mometasone-formoterol  2 puff Inhalation BID  . tamsulosin  0.4 mg Oral Daily  . umeclidinium bromide  1 puff Inhalation Daily   Continuous Infusions:    LOS: 2 days   Geradine Girt  [facility]   How to contact the [facility] Attending or Consulting provider [place] or covering provider during after hours [place], for this patient?  1. Check the care team in [facility] and look for a) attending/consulting TRH provider listed and b) the [facility] team listed 2. Log into [email or web address] and use [place]'s universal password to access. If you do not have the password, please contact the hospital operator. 3. Locate the [facility] provider you are looking for under [facility]  and page to a number that you can be directly reached. 4. If you still have difficulty reaching the provider, please page the [facility] (Director on Call) for the Hospitalists listed on amion for assistance.  06/15/2018, 10:14 AM

## 2018-06-15 NOTE — Progress Notes (Signed)
Experiencing persistent congested cough with SOB gave a PRN Breathing Treatment was able to get a thick sputum and sent down for testing.

## 2018-06-15 NOTE — Progress Notes (Signed)
Pt was asking about his Flomax?, may need to address, Thanks Arvella Nigh RN.

## 2018-06-15 NOTE — Plan of Care (Signed)
  Problem: Activity: Goal: Risk for activity intolerance will decrease Outcome: Adequate for Discharge   Problem: Nutrition: Goal: Adequate nutrition will be maintained Outcome: Adequate for Discharge   Problem: Coping: Goal: Level of anxiety will decrease Note:  Palliative following  Need adequate cooling at home with COPD

## 2018-06-15 NOTE — Consult Note (Signed)
Consultation Note Date: 06/15/2018   Patient Name: Taylor [name]  DOB: 08-May-1955  MRN: [id number]  Age / Sex: 63 y.o., male  PCP: Tawny Asal Referring Physician: Geradine Girt, DO  Reason for Consultation: Establishing goals of care and Psychosocial/spiritual support  HPI/Patient Profile: 63 y.o. male  admitted on 06/12/2018 with a past medical history significant forCOPD with chronic hypoxic and hypercarbic respiratory failure, bipolar disorder, colon cancer status post resection and chemotherapy, type 2 diabetes mellitus, hypertension, prostate cancer undergoing radiation, and cocaine abuse, now presenting to the emergency department for evaluation of shortness of breath.   Multiple hospitalizations over the past 6 months for similar events.  Patient was admitted to the hospital earlier this month for COPD exacerbation, reports that he continued to do well back at home for a couple weeks from a respiratory perspective, but now over the past several days to a week, he has had progressive worsening in his dyspnea, wheezing, but not much change in his chronic cough and no fevers or chills  Patient reports that he ran out of his albuterol recently.  Dense psycho-social issues.  In and out of unstable housing issues. Currently living with a friend in a trailer, no air-conditioning  Patient faces treatment option decisions, advanced directive decisions and anticipatory care needs.  Clinical Assessment and Goals of Care:   This NP Wadie Lessen reviewed medical records, received report from team, assessed the patient and then meet at the patient's bedside  to discuss diagnosis, prognosis, GOC, EOL wishes disposition and options.  Concept of  Palliative Care was discussed  A detailed discussion was had today regarding advanced directives.  Concepts specific to code status, artifical  feeding and hydration, continued IV antibiotics and rehospitalization was had.  The difference between a aggressive medical intervention path  and a palliative comfort care path for this patient at this time was had.  Values and goals of care important to patient and family were attempted to be elicited.   Questions and concerns addressed.   Family encouraged to call with questions or concerns.    PMT will continue to support holistically.    No documented healthcare power of attorney or advanced directive. Patient encouraged to consider documentation of both H POA and AD     SUMMARY OF RECOMMENDATIONS    Code Status/Advance Care Planning:  DNR   Palliative Prophylaxis:   Delirium Protocol, Frequent Pain Assessment and Oral Care  Additional Recommendations (Limitations, Scope, Preferences):  Full Scope Treatment  Psycho-social/Spiritual:   Desire for further Chaplaincy support:  yes Additional Recommendations: will discuss with CM/SW for investigation into alternative housing.  Patient is also concerned with establishing vendor for home oxygen.  Prognosis:   Unable to determine  Discharge Planning: To Be Determined      Primary Diagnoses: Present on Admission: . COPD with acute exacerbation ([place]) . Prostate cancer ([place]) . Chronic respiratory failure with hypoxia and hypercapnia (HCC) . Cocaine abuse ([place]) . Hyponatremia . Essential hypertension . Bipolar 1 disorder ([place]) .  COPD exacerbation ([place])   I have reviewed the medical record, interviewed the patient and family, and examined the patient. The following aspects are pertinent.  Past Medical History:  Diagnosis Date  . Acute on chronic respiratory failure ([place])   . Asthma   . Bipolar 1 disorder ([place]) 02/05/2012  . Colon cancer ([place]) 04/11/11   adenocarcinoma of colon, 7/19 nodes pos.FINISHED CHEMO/DR. SHERRILL  . COPD (chronic obstructive pulmonary disease) ([place])    SMOKER  . Depression   . Dyspnea    . Emphysema of lung ([place])   . Full dentures   . Hemorrhoids   . On home oxygen therapy    "3L; 24/7" (05/29/2017)  . Oxygen deficiency   . Pneumonia ~ 2016   "double pneumonia"  . Prostate cancer ([place])    Gleason score = 7, supposed to have radiation therapy but he has not followed up (05/29/2017)  . Rib fractures    hx of   Social History   Socioeconomic History  . Marital status: Divorced    Spouse name: Not on file  . Number of children: 1  . Years of education: Not on file  . Highest education level: Not on file  Occupational History  . Occupation: disability pending  Social Needs  . Financial resource strain: Not hard at all  . Food insecurity:    Worry: Never true    Inability: Never true  . Transportation needs:    Medical: Yes    Non-medical: Yes  Tobacco Use  . Smoking status: Former Smoker    Packs/day: 0.10    Years: 46.00    Pack years: 4.60    Types: Cigarettes    Last attempt to quit: 05/21/2017    Years since quitting: 1.0  . Smokeless tobacco: Former Systems developer    Types: Chew  Substance and Sexual Activity  . Alcohol use: Not Currently    Comment: h/o use in the past, no h/o heavy use  . Drug use: No  . Sexual activity: Not Currently  Lifestyle  . Physical activity:    Days per week: 0 days    Minutes per session: 0 min  . Stress: Very much  Relationships  . Social connections:    Talks on phone: More than three times a week    Gets together: Never    Attends religious service: Never    Active member of club or organization: No    Attends meetings of clubs or organizations: Never    Relationship status: Divorced  Other Topics Concern  . Not on file  Social History Narrative   Lives alone-divorced, disabled, in [place] 1970's   Brother, Vergil Burby and his wife Jackelyn Poling assist   Prior occupation: Clinical biochemist   Family History  Problem Relation Age of Onset  . Heart disease Father   . Heart failure Mother   . Heart disease Mother   . Lung cancer  Maternal Uncle        smoked   Scheduled Meds: . ARIPiprazole  5 mg Oral Daily  . [START ON 06/16/2018] azithromycin  500 mg Oral Daily  . carvedilol  3.125 mg Oral BID WC  . enoxaparin (LOVENOX) injection  40 mg Subcutaneous Q24H  . escitalopram  20 mg Oral Daily  . insulin aspart  0-20 Units Subcutaneous TID WC  . insulin aspart  0-5 Units Subcutaneous QHS  . methylPREDNISolone (SOLU-MEDROL) injection  40 mg Intravenous Q8H  . mometasone-formoterol  2 puff Inhalation BID  . tamsulosin  0.4 mg Oral Daily  . umeclidinium bromide  1 puff Inhalation Daily   Continuous Infusions: PRN Meds:.acetaminophen **OR** acetaminophen, albuterol, guaiFENesin-dextromethorphan, LORazepam, ondansetron **OR** ondansetron (ZOFRAN) IV Medications Prior to Admission:  Prior to Admission medications   Medication Sig Start Date End Date Taking? Authorizing Provider  albuterol (PROVENTIL HFA) 108 (90 Base) MCG/ACT inhaler Inhale 2 puffs into the lungs every 6 (six) hours as needed for up to 30 days for wheezing or shortness of breath. 05/28/18 06/27/18 Yes Elodia Florence., MD  albuterol (PROVENTIL) (2.5 MG/3ML) 0.083% nebulizer solution Take 3 mLs (2.5 mg total) by nebulization every 4 (four) hours as needed for up to 30 days for wheezing or shortness of breath (if you can't catch your breath). 05/28/18 06/27/18 Yes Elodia Florence., MD  ARIPiprazole (ABILIFY) 5 MG tablet Take 1 tablet (5 mg total) by mouth daily for 30 days. 05/28/18 06/27/18 Yes Elodia Florence., MD  Blood Glucose Monitoring Suppl (ACCU-CHEK AVIVA) device Use as instructed 10/27/17 10/27/18 Yes Clent Demark, PA-C  carvedilol (COREG) 3.125 MG tablet Take 1 tablet (3.125 mg total) by mouth 2 (two) times daily with a meal for 30 days. 05/28/18 06/27/18 Yes Elodia Florence., MD  escitalopram (LEXAPRO) 20 MG tablet Take 1 tablet (20 mg total) by mouth daily for 30 days. 05/28/18 06/27/18 Yes Elodia Florence., MD  glucose blood  (ACCU-CHEK AVIVA) test strip 1 each by Other route as needed for other. Use as instructed 10/27/17  Yes Clent Demark, PA-C  Lancets (ACCU-CHEK SOFT [facility]) lancets 1 each by Other route 2 (two) times daily. Use as instructed 10/27/17  Yes Clent Demark, PA-C  loperamide (IMODIUM) 2 MG capsule Take 2 every 6 hours if continued diarrhea Patient taking differently: Take 2-4 mg by mouth See admin instructions. Take 2 mg by mouth as needed for diarrhea or loose stools and 4 mg every 6 hours if diarrhea continues 04/10/18  Yes [name], MD  lovastatin (MEVACOR) 20 MG tablet Take 1 tablet (20 mg total) by mouth at bedtime for 30 days. 05/28/18 06/27/18 Yes Elodia Florence., MD  metFORMIN (GLUCOPHAGE) 1000 MG tablet Take 1 tablet (1,000 mg total) by mouth 2 (two) times daily with a meal. 10/27/17  Yes Clent Demark, PA-C  mometasone-formoterol [facility]) 100-5 MCG/ACT AERO Inhale 2 puffs into the lungs 2 (two) times daily for 30 days. 05/28/18 06/27/18 Yes Elodia Florence., MD  nicotine (NICODERM CQ - DOSED IN MG/24 HOURS) 14 mg/24hr patch Place 1 patch (14 mg total) onto the skin daily. 05/29/18  Yes Elodia Florence., MD  OXYGEN Inhale 3 L into the lungs continuous.    Yes [provider]  tiotropium (SPIRIVA) 18 MCG inhalation capsule Place 1 capsule (18 mcg total) into inhaler and inhale daily. 04/06/18  Yes [name], MD   Allergies  Allergen Reactions  . Codeine Nausea And Vomiting   Review of Systems  Respiratory: Positive for shortness of breath.   Neurological: Positive for weakness.    Physical Exam Constitutional:      Appearance: He is ill-appearing.  Cardiovascular:     Rate and Rhythm: Normal rate and regular rhythm.     Heart sounds: Normal heart sounds.  Pulmonary:     Breath sounds: Decreased breath sounds present.  Skin:    General: Skin is warm and dry.  Neurological:     Mental Status: He is alert and oriented to  person, place, and time.      Vital Signs: BP 127/90 (BP Location: Right Arm)   Pulse 80   Temp 98.2 F (36.8 C) (Oral)   Resp 17   Ht 5\' 11"  (1.803 m)   Wt 64.9 kg   SpO2 99%   BMI 19.96 kg/m  Pain Scale: 0-10   Pain Score: 0-No pain   SpO2: SpO2: 99 % O2 Device:SpO2: 99 % O2 Flow Rate: .O2 Flow Rate (L/min): 3 L/min  IO: Intake/output summary:   Intake/Output Summary (Last 24 hours) at 06/15/2018 1116 Last data filed at 06/15/2018 0847 Gross per 24 hour  Intake 2874 ml  Output 2025 ml  Net 849 ml    LBM: Last BM Date: 06/14/18 Baseline Weight: Weight: 65 kg Most recent weight: Weight: 64.9 kg     Palliative Assessment/Data:   Left VM for  Olga Coaster CMRN  Time In: 1000 Time Out: 1110 Time Total: 75 minutes Greater than 50%  of this time was spent counseling and coordinating care related to the above assessment and plan.  Signed by: Wadie Lessen, NP   Please contact Palliative Medicine Team phone at [phone] for questions and concerns.  For individual provider: See Shea Evans

## 2018-06-16 ENCOUNTER — Ambulatory Visit: Payer: No Typology Code available for payment source

## 2018-06-16 DIAGNOSIS — Z515 Encounter for palliative care: Secondary | ICD-10-CM

## 2018-06-16 LAB — EXPECTORATED SPUTUM ASSESSMENT W GRAM STAIN, RFLX TO RESP C

## 2018-06-16 LAB — GLUCOSE, CAPILLARY
Glucose-Capillary: 132 mg/dL — ABNORMAL HIGH (ref 70–99)
Glucose-Capillary: 179 mg/dL — ABNORMAL HIGH (ref 70–99)

## 2018-06-16 MED ORDER — TAMSULOSIN HCL 0.4 MG PO CAPS: 0.4000 mg | ORAL_CAPSULE | Freq: Every day | ORAL | 0 refills | Status: DC

## 2018-06-16 MED ORDER — PREDNISONE 10 MG PO TABS: ORAL_TABLET | ORAL | 0 refills | Status: DC

## 2018-06-16 NOTE — Discharge Instructions (Signed)
Resume home O2

## 2018-06-16 NOTE — Discharge Summary (Signed)
Physician Discharge Summary  PHARAOH PIO WPY:[id number] DOB: Feb 26, 1955 DOA: 06/12/2018  PCP: Clent Demark, PA-C  Admit date: 06/12/2018 Discharge date: 06/16/2018  Admitted From: home Discharge disposition: home   Recommendations for Outpatient Follow-Up:   Resume home O2 May need low dose prednisone -- have done taper Poor living situation  Discharge Diagnosis:   Principal Problem:   COPD with acute exacerbation ([place]) Active Problems:   Bipolar 1 disorder ([place])   COPD exacerbation ([place])   Prostate cancer ([place])   Chronic respiratory failure with hypoxia and hypercapnia ([place])   Essential hypertension   Hyponatremia   Cocaine abuse ([place])   Diabetes mellitus type II, non insulin dependent ([place])    Discharge Condition: Improved.  Diet recommendation: Low sodium, heart healthy.  Wound care: None.  Code status:dnr   History of Present Illness:   Taylor [name] is a 63 y.o. 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  Patient was admitted to the hospital earlier this month for COPD exacerbation, reports that he continued to do well back at home for a couple weeks from a respiratory perspective, but now over the past several days to a week, he has had progressive worsening in his dyspnea, wheezing, but not much change in his chronic cough and no fevers or chills.  Patient reports that he ran out of his albuterol recently. Denies lower extremity swelling or tenderness, denies chest pain.  Denied fever, chills, or sick-contacts.    Hospital Course by Problem:   COPD with acute exacerbation; chronic hypoxic & hypercarbic respiratory failure  -CXR is clear, exam consistent with COPD exacerbation -He was treated with albuterol, steroids, and magnesium in the ED but remains dyspneic at rest -Check sputum culture, continue systemic steroid, start azithromycin, continue Dulera, Spiriva, and as-needed albuterol -IV Steroids-- change to PO and taper-- defer to PCP if he could benefit from low dose daily steroids  Prostate cancer -Had been undergoing radiation for this but states that he does not want to continue and plans to discuss this with his urologist  Type II DM -A1c was 7.7% in January -resume home meds  Hyponatremia -Serum sodium is 133 on admission in setting of hypovolemia -improved after hydration  Bipolar disorder -Patient reports depression and anxiety have been worse in recent weeks which he attributes to poor health and frequent hospitalizations but he denies any SI, HI, or hallucinations -Continue Abilify and Lexapro  Hypertension -continue [place] [facility]:   Palliative care   Discharge Exam:   Vitals:   06/16/18 0759 06/16/18 0839  BP:  130/88  Pulse:  83  Resp:  18  Temp:    SpO2: 97% 98%   Vitals:   06/16/18 0753 06/16/18 0754 06/16/18 0759 06/16/18 0839  BP:    130/88  Pulse:    83  Resp:    18  Temp:      TempSrc:      SpO2: 97% 97% 97% 98%  Weight:      Height:        General exam: Appears calm and comfortable. Walked well with PT   The results of significant diagnostics from this hospitalization (including imaging, microbiology, ancillary and laboratory) are listed below for reference.  Procedures and Diagnostic Studies:   Dg Chest Port 1 View  Result Date: 06/12/2018 CLINICAL DATA:  Shortness of breath EXAM: PORTABLE CHEST 1 VIEW COMPARISON:  05/25/2018 FINDINGS: The lungs are hyperexpanded. There is no pneumothorax. No area of consolidation. No large pleural effusion. Old right-sided rib fractures are noted. There is no  acute osseous abnormality. IMPRESSION: No acute cardiopulmonary process. Electronically Signed   By: Constance Holster M.D.   On: 06/12/2018 22:44     Labs:   Basic Metabolic Panel: Recent Labs  Lab 06/12/18 2203 06/13/18 0441 06/15/18 0624  NA 133* 135 136  K 4.3 4.3 4.3  CL 83* 90* 89*  CO2 39* 37* 41*  GLUCOSE 102* 214* 128*  BUN 6* 8 11  CREATININE 0.65 0.53* 0.43*  CALCIUM 8.6* 8.2* 8.2*   GFR Estimated Creatinine Clearance: 88.6 mL/min (A) (by C-G formula based on SCr of 0.43 mg/dL (L)). Liver Function Tests: No results for input(s): AST, ALT, ALKPHOS, BILITOT, PROT, ALBUMIN in the last 168 hours. No results for input(s): LIPASE, AMYLASE in the last 168 hours. No results for input(s): AMMONIA in the last 168 hours. Coagulation profile No results for input(s): INR, PROTIME in the last 168 hours.  CBC: Recent Labs  Lab 06/12/18 2203 06/13/18 0441 06/15/18 0624  WBC 5.7 3.9* 6.5  NEUTROABS 4.6 3.7  --   HGB 12.3* 11.6* 11.5*  HCT 38.5* 36.6* 35.6*  MCV 98.2 96.6 96.5  PLT 190 177 191   Cardiac Enzymes: Recent Labs  Lab 06/12/18 2203  TROPONINI <0.03   BNP: Invalid input(s): POCBNP CBG: Recent Labs  Lab 06/15/18 0611 06/15/18 1117 06/15/18 1614 06/15/18 2113 06/16/18 0604  GLUCAP 121* 131* 170* 142* 132*   D-Dimer No results for input(s): DDIMER in the last 72 hours. Hgb A1c No results for input(s): HGBA1C in the last 72 hours. Lipid Profile No results for input(s): CHOL, HDL, LDLCALC, TRIG, CHOLHDL, LDLDIRECT in the last 72 hours. Thyroid function studies No results for input(s): TSH, T4TOTAL, T3FREE, THYROIDAB in the last 72 hours.  Invalid input(s): FREET3 Anemia work up No results for input(s): VITAMINB12, FOLATE, FERRITIN, TIBC, IRON, RETICCTPCT in the last 72 hours. Microbiology Recent Results (from the past 240 hour(s))  Culture, sputum-assessment     Status: None   Collection Time: 06/12/18  5:34 PM  Result Value Ref Range Status    Specimen Description EXPECTORATED SPUTUM  Final   Special Requests NONE  Final   Sputum evaluation   Final    Sputum specimen not acceptable for testing.  Please recollect.   Gram Stain Report Called to,Read Back By and Verified With: Kennith Center RN, AT [phone] 06/16/18 BY Rush Landmark Performed at [facility], [place] [address], [place], [name] [address]    Report Status 06/16/2018 FINAL  Final  SARS Coronavirus 2 (CEPHEID - Performed in [place] hospital lab), Hosp Order     Status: None   Collection Time: 06/12/18 10:27 PM  Result Value Ref Range Status   SARS Coronavirus 2 NEGATIVE NEGATIVE Final    Comment: (NOTE) If result is NEGATIVE SARS-CoV-2 target nucleic acids are NOT DETECTED. The SARS-CoV-2 RNA is generally detectable in upper and lower  respiratory specimens during the acute phase of infection. The lowest  concentration of SARS-CoV-2 viral copies this assay can detect is 250  copies / mL. A negative result does not preclude SARS-CoV-2 infection  and should not be used as the sole basis for treatment or other  patient management decisions.  A negative result may occur with  improper specimen collection / handling, submission of specimen other  than nasopharyngeal swab, presence of viral mutation(s) within the  areas targeted by this assay, and inadequate number of viral copies  (<250 copies / mL). A negative result must be combined with clinical  observations, patient history, and epidemiological information. If result is POSITIVE SARS-CoV-2 target nucleic acids are DETECTED. The SARS-CoV-2 RNA is generally detectable in upper and lower  respiratory specimens dur ing the acute phase of infection.  Positive  results are indicative of active infection with SARS-CoV-2.  Clinical  correlation with patient history and other diagnostic information is  necessary to determine patient infection status.  Positive results do  not rule out bacterial infection or co-infection  with other viruses. If result is PRESUMPTIVE POSTIVE SARS-CoV-2 nucleic acids MAY BE PRESENT.   A presumptive positive result was obtained on the submitted specimen  and confirmed on repeat testing.  While 2019 novel coronavirus  (SARS-CoV-2) nucleic acids may be present in the submitted sample  additional confirmatory testing may be necessary for epidemiological  and / or clinical management purposes  to differentiate between  SARS-CoV-2 and other Sarbecovirus currently known to infect humans.  If clinically indicated additional testing with an alternate test  methodology [id number]) is advised. The SARS-CoV-2 RNA is generally  detectable in upper and lower respiratory sp ecimens during the acute  phase of infection. The expected result is Negative. Fact Sheet for Patients:  StrictlyIdeas.no Fact Sheet for Healthcare Providers: BankingDealers.co.za This test is not yet approved or cleared by the [place] FDA and has been authorized for detection and/or diagnosis of SARS-CoV-2 by FDA under an Emergency Use Authorization (EUA).  This EUA will remain in effect (meaning this test can be used) for the duration of the COVID-19 declaration under Section 564(b)(1) of the Act, 21 U.S.C. section 360bbb-3(b)(1), unless the authorization is terminated or revoked sooner. Performed at [facility], [place] [address], [place], [place] [address]      Discharge Instructions:   Discharge Instructions    Diet - low sodium heart healthy   Complete by:  As directed    Increase activity slowly   Complete by:  As directed      Allergies as of 06/16/2018      Reactions   Codeine Nausea And Vomiting      Medication List    TAKE these medications   Accu-Chek Aviva device Use as instructed   accu-chek soft touch lancets 1 each by Other route 2 (two) times daily. Use as instructed   albuterol (2.5 MG/3ML) 0.083% nebulizer solution  Commonly known as:  PROVENTIL Take 3 mLs (2.5 mg total) by nebulization every 4 (four) hours as needed for up to 30 days for wheezing or shortness of breath (if you can't catch your breath).   albuterol 108 (90 Base) MCG/ACT inhaler Commonly known as:  Proventil HFA Inhale 2 puffs into the lungs every 6 (six) hours as needed for up to 30 days for wheezing or shortness of breath.   ARIPiprazole 5 MG tablet Commonly known as:  ABILIFY Take 1 tablet (5 mg total) by mouth daily for 30 days.   carvedilol 3.125 MG tablet Commonly known as:  COREG Take 1 tablet (3.125 mg total) by mouth 2 (two) times daily with a meal for 30 days.   escitalopram 20 MG tablet Commonly known as:  LEXAPRO Take 1 tablet (20 mg total) by  mouth daily for 30 days.   glucose blood test strip Commonly known as:  Accu-Chek Aviva 1 each by Other route as needed for other. Use as instructed   loperamide 2 MG capsule Commonly known as:  IMODIUM Take 2 every 6 hours if continued diarrhea What changed:    how much to take  how to take this  when to take this  additional instructions   lovastatin 20 MG tablet Commonly known as:  MEVACOR Take 1 tablet (20 mg total) by mouth at bedtime for 30 days.   metFORMIN 1000 MG tablet Commonly known as:  GLUCOPHAGE Take 1 tablet (1,000 mg total) by mouth 2 (two) times daily with a meal.   mometasone-formoterol 100-5 MCG/ACT Aero Commonly known as:  DULERA Inhale 2 puffs into the lungs 2 (two) times daily for 30 days.   nicotine 14 mg/24hr patch Commonly known as:  NICODERM CQ - dosed in mg/24 hours Place 1 patch (14 mg total) onto the skin daily.   OXYGEN Inhale 3 L into the lungs continuous.   predniSONE 10 MG tablet Commonly known as:  DELTASONE 40 mg x2 then 30 mg x3 days then 20 mg x 3 days then 10mg  x3 days then stop   tamsulosin 0.4 MG Caps capsule Commonly known as:  FLOMAX Take 1 capsule (0.4 mg total) by mouth daily. Start taking on:  Jun 17, 2018   tiotropium 18 MCG inhalation capsule Commonly known as:  SPIRIVA Place 1 capsule (18 mcg total) into inhaler and inhale daily.      Follow-up Information    Clent Demark, PA-C Follow up in 1 week(s).   Specialty:  Physician Assistant Contact information: [place] [address] [phone]            Time coordinating discharge: 35 min  Signed:  Geradine Girt DO  [facility] 06/16/2018, 9:54 AM

## 2018-06-16 NOTE — Progress Notes (Signed)
Patient ID: Taylor [name], male   DOB: 17-Feb-1955, 63 y.o.   MRN: [id number]  This NP visited patient at the bedside as a follow up to  yesterday's [place], for palliative medicine needs and emotional support.  Patient anticipates discharge home today or tomorrow.  Social situation is limited.  I stressed the importance again of the patient examining his advanced directives and end-of-life wishes.  We discussed the importance and impact of self responsibility for his own health care.  Recommend community-based palliative care services as an extra layer support on discharge  Questions and concerns addressed       Total time spent on the unit was 15 minutes  Greater than 50% of the time was spent in counseling and coordination of care  Wadie Lessen NP  Palliative Medicine Team Team Phone # [phone] Pager [phone]
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
## 2018-06-16 NOTE — Progress Notes (Signed)
Pt refusing bed alarm.

## 2018-06-17 ENCOUNTER — Ambulatory Visit: Payer: No Typology Code available for payment source

## 2018-06-18 ENCOUNTER — Ambulatory Visit: Payer: No Typology Code available for payment source

## 2018-06-19 ENCOUNTER — Ambulatory Visit: Payer: No Typology Code available for payment source

## 2018-06-21 ENCOUNTER — Other Ambulatory Visit: Payer: Self-pay | Admitting: Family Medicine

## 2018-06-22 ENCOUNTER — Ambulatory Visit: Payer: Medicaid Other | Attending: Radiation Oncology

## 2018-06-22 ENCOUNTER — Ambulatory Visit: Payer: Medicaid Other

## 2018-06-22 DIAGNOSIS — Z510 Encounter for antineoplastic radiation therapy: Secondary | ICD-10-CM | POA: Insufficient documentation

## 2018-06-22 DIAGNOSIS — C61 Malignant neoplasm of prostate: Secondary | ICD-10-CM | POA: Insufficient documentation

## 2018-06-23 ENCOUNTER — Ambulatory Visit: Payer: Medicaid Other

## 2018-06-24 ENCOUNTER — Ambulatory Visit: Payer: Medicaid Other

## 2018-06-24 ENCOUNTER — Ambulatory Visit: Admission: RE | Admit: 2018-06-24 | Payer: Medicaid Other | Source: Ambulatory Visit

## 2018-06-25 ENCOUNTER — Ambulatory Visit: Payer: Medicaid Other | Attending: Primary Care | Admitting: Primary Care

## 2018-06-25 ENCOUNTER — Other Ambulatory Visit: Payer: Self-pay

## 2018-06-25 ENCOUNTER — Ambulatory Visit: Payer: Medicaid Other

## 2018-06-26 ENCOUNTER — Ambulatory Visit: Payer: Medicaid Other

## 2018-06-27 ENCOUNTER — Other Ambulatory Visit: Payer: Self-pay

## 2018-06-27 ENCOUNTER — Emergency Department (HOSPITAL_COMMUNITY): Payer: Medicaid Other

## 2018-06-27 ENCOUNTER — Encounter (HOSPITAL_COMMUNITY): Payer: Self-pay

## 2018-06-27 ENCOUNTER — Inpatient Hospital Stay (HOSPITAL_COMMUNITY)
Admission: EM | Admit: 2018-06-27 | Discharge: 2018-07-03 | DRG: 190 | Disposition: A | Discharge status: Home / Self Care | Payer: Medicaid Other | Attending: Internal Medicine | Admitting: Internal Medicine

## 2018-06-27 DIAGNOSIS — Z8249 Family history of ischemic heart disease and other diseases of the circulatory system: Secondary | ICD-10-CM

## 2018-06-27 DIAGNOSIS — E0965 Drug or chemical induced diabetes mellitus with hyperglycemia: Secondary | ICD-10-CM | POA: Diagnosis present

## 2018-06-27 DIAGNOSIS — Z85038 Personal history of other malignant neoplasm of large intestine: Secondary | ICD-10-CM

## 2018-06-27 DIAGNOSIS — J9622 Acute and chronic respiratory failure with hypercapnia: Secondary | ICD-10-CM | POA: Diagnosis present

## 2018-06-27 DIAGNOSIS — F1010 Alcohol abuse, uncomplicated: Secondary | ICD-10-CM | POA: Diagnosis present

## 2018-06-27 DIAGNOSIS — I10 Essential (primary) hypertension: Secondary | ICD-10-CM | POA: Diagnosis present

## 2018-06-27 DIAGNOSIS — Z20828 Contact with and (suspected) exposure to other viral communicable diseases: Secondary | ICD-10-CM | POA: Diagnosis present

## 2018-06-27 DIAGNOSIS — F319 Bipolar disorder, unspecified: Secondary | ICD-10-CM | POA: Diagnosis present

## 2018-06-27 DIAGNOSIS — Z66 Do not resuscitate: Secondary | ICD-10-CM | POA: Diagnosis present

## 2018-06-27 DIAGNOSIS — Z9981 Dependence on supplemental oxygen: Secondary | ICD-10-CM

## 2018-06-27 DIAGNOSIS — R251 Tremor, unspecified: Secondary | ICD-10-CM | POA: Diagnosis present

## 2018-06-27 DIAGNOSIS — E099 Drug or chemical induced diabetes mellitus without complications: Secondary | ICD-10-CM | POA: Diagnosis present

## 2018-06-27 DIAGNOSIS — T380X5A Adverse effect of glucocorticoids and synthetic analogues, initial encounter: Secondary | ICD-10-CM | POA: Diagnosis present

## 2018-06-27 DIAGNOSIS — F17200 Nicotine dependence, unspecified, uncomplicated: Secondary | ICD-10-CM | POA: Diagnosis present

## 2018-06-27 DIAGNOSIS — D696 Thrombocytopenia, unspecified: Secondary | ICD-10-CM | POA: Diagnosis present

## 2018-06-27 DIAGNOSIS — Z7984 Long term (current) use of oral hypoglycemic drugs: Secondary | ICD-10-CM

## 2018-06-27 DIAGNOSIS — Z8546 Personal history of malignant neoplasm of prostate: Secondary | ICD-10-CM

## 2018-06-27 DIAGNOSIS — J9621 Acute and chronic respiratory failure with hypoxia: Secondary | ICD-10-CM | POA: Diagnosis present

## 2018-06-27 DIAGNOSIS — J441 Chronic obstructive pulmonary disease with (acute) exacerbation: Principal | ICD-10-CM | POA: Diagnosis present

## 2018-06-27 LAB — CBC WITH DIFFERENTIAL/PLATELET
Abs Immature Granulocytes: 0.07 10*3/uL (ref 0.00–0.07)
Basophils Absolute: 0 10*3/uL (ref 0.0–0.1)
Basophils Relative: 0 %
Eosinophils Absolute: 0.1 10*3/uL (ref 0.0–0.5)
Eosinophils Relative: 1 %
HCT: 39.6 % (ref 39.0–52.0)
Hemoglobin: 12.6 g/dL — ABNORMAL LOW (ref 13.0–17.0)
Immature Granulocytes: 1 %
Lymphocytes Relative: 8 %
Lymphs Abs: 0.6 10*3/uL — ABNORMAL LOW (ref 0.7–4.0)
MCH: 31.1 pg (ref 26.0–34.0)
MCHC: 31.8 g/dL (ref 30.0–36.0)
MCV: 97.8 fL (ref 80.0–100.0)
Monocytes Absolute: 0.5 10*3/uL (ref 0.1–1.0)
Monocytes Relative: 6 %
Neutro Abs: 6 10*3/uL (ref 1.7–7.7)
Neutrophils Relative %: 84 %
Platelets: 242 10*3/uL (ref 150–400)
RBC: 4.05 MIL/uL — ABNORMAL LOW (ref 4.22–5.81)
RDW: 12.7 % (ref 11.5–15.5)
WBC: 7.3 10*3/uL (ref 4.0–10.5)
nRBC: 0 % (ref 0.0–0.2)

## 2018-06-27 LAB — BASIC METABOLIC PANEL
Anion gap: 10 (ref 5–15)
BUN: 10 mg/dL (ref 8–23)
CO2: 40 mmol/L — ABNORMAL HIGH (ref 22–32)
Calcium: 8.6 mg/dL — ABNORMAL LOW (ref 8.9–10.3)
Chloride: 83 mmol/L — ABNORMAL LOW (ref 98–111)
Creatinine, Ser: 0.6 mg/dL — ABNORMAL LOW (ref 0.61–1.24)
GFR calc Af Amer: >60 mL/min (ref >60)
GFR calc non Af Amer: >60 mL/min (ref >60)
Glucose, Bld: 124 mg/dL — ABNORMAL HIGH (ref 70–99)
Potassium: 4.2 mmol/L (ref 3.5–5.1)
Sodium: 133 mmol/L — ABNORMAL LOW (ref 135–145)

## 2018-06-27 LAB — SARS CORONAVIRUS 2 BY RT PCR (HOSPITAL ORDER, PERFORMED IN ~~LOC~~ HOSPITAL LAB): SARS Coronavirus 2: NEGATIVE

## 2018-06-27 LAB — TROPONIN I: Troponin I: <0.03 ng/mL (ref <0.03)

## 2018-06-27 MED ORDER — AEROCHAMBER PLUS FLO-VU LARGE MISC
Status: AC
  Filled 2018-06-27: qty 1

## 2018-06-27 MED ORDER — ALBUTEROL SULFATE HFA 108 (90 BASE) MCG/ACT IN AERS
8.0000 | INHALATION_SPRAY | Freq: Once | RESPIRATORY_TRACT | Status: AC
  Administered 2018-06-27: 8 via RESPIRATORY_TRACT
  Filled 2018-06-27: qty 6.7

## 2018-06-27 MED ORDER — ALBUTEROL SULFATE HFA 108 (90 BASE) MCG/ACT IN AERS: 8.0000 | INHALATION_SPRAY | Freq: Once | RESPIRATORY_TRACT | Status: DC

## 2018-06-27 MED ORDER — AEROCHAMBER PLUS FLO-VU LARGE MISC
1.0000 | Freq: Once | Status: AC
  Administered 2018-06-27: 1

## 2018-06-27 MED ORDER — ALBUTEROL (5 MG/ML) CONTINUOUS INHALATION SOLN
10.0000 mg/h | INHALATION_SOLUTION | RESPIRATORY_TRACT | Status: AC
  Administered 2018-06-28: 10 mg/h via RESPIRATORY_TRACT
  Filled 2018-06-27: qty 20

## 2018-06-27 NOTE — ED Triage Notes (Signed)
Pt from home with ems for CP/SOB since yesterday, hx of COPD on 3L [place] all the time. EKG ST with PACs. 324 ASA, 1 Nitro, 2mg  Mag and 125 solumedrol Iv given en route. Pt states his Cp has improved some and his work of breathing has improved. Wheezing noted bilaterally. Pt arrives to ED alert, oriented.
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
## 2018-06-28 DIAGNOSIS — J9621 Acute and chronic respiratory failure with hypoxia: Secondary | ICD-10-CM

## 2018-06-28 DIAGNOSIS — J9622 Acute and chronic respiratory failure with hypercapnia: Secondary | ICD-10-CM

## 2018-06-28 DIAGNOSIS — F1010 Alcohol abuse, uncomplicated: Secondary | ICD-10-CM

## 2018-06-28 DIAGNOSIS — J441 Chronic obstructive pulmonary disease with (acute) exacerbation: Principal | ICD-10-CM

## 2018-06-28 LAB — CBC
HCT: 39.3 % (ref 39.0–52.0)
Hemoglobin: 12.7 g/dL — ABNORMAL LOW (ref 13.0–17.0)
MCH: 31.1 pg (ref 26.0–34.0)
MCHC: 32.3 g/dL (ref 30.0–36.0)
MCV: 96.1 fL (ref 80.0–100.0)
Platelets: 213 10*3/uL (ref 150–400)
RBC: 4.09 MIL/uL — ABNORMAL LOW (ref 4.22–5.81)
RDW: 13 % (ref 11.5–15.5)
WBC: 4.1 10*3/uL (ref 4.0–10.5)
nRBC: 0 % (ref 0.0–0.2)

## 2018-06-28 LAB — COMPREHENSIVE METABOLIC PANEL
ALT: 27 U/L (ref 0–44)
AST: 19 U/L (ref 15–41)
Albumin: 3.5 g/dL (ref 3.5–5.0)
Alkaline Phosphatase: 55 U/L (ref 38–126)
Anion gap: 10 (ref 5–15)
BUN: 12 mg/dL (ref 8–23)
CO2: 39 mmol/L — ABNORMAL HIGH (ref 22–32)
Calcium: 8.5 mg/dL — ABNORMAL LOW (ref 8.9–10.3)
Chloride: 86 mmol/L — ABNORMAL LOW (ref 98–111)
Creatinine, Ser: 0.62 mg/dL (ref 0.61–1.24)
GFR calc Af Amer: >60 mL/min (ref >60)
GFR calc non Af Amer: >60 mL/min (ref >60)
Glucose, Bld: 258 mg/dL — ABNORMAL HIGH (ref 70–99)
Potassium: 4.2 mmol/L (ref 3.5–5.1)
Sodium: 135 mmol/L (ref 135–145)
Total Bilirubin: 0.6 mg/dL (ref 0.3–1.2)
Total Protein: 5.5 g/dL — ABNORMAL LOW (ref 6.5–8.1)

## 2018-06-28 LAB — MAGNESIUM: Magnesium: 2.4 mg/dL (ref 1.7–2.4)

## 2018-06-28 LAB — GLUCOSE, CAPILLARY
Glucose-Capillary: 229 mg/dL — ABNORMAL HIGH (ref 70–99)
Glucose-Capillary: 183 mg/dL — ABNORMAL HIGH (ref 70–99)
Glucose-Capillary: 49 mg/dL — ABNORMAL LOW (ref 70–99)
Glucose-Capillary: 83 mg/dL (ref 70–99)
Glucose-Capillary: 133 mg/dL — ABNORMAL HIGH (ref 70–99)

## 2018-06-28 MED ORDER — INSULIN ASPART 100 UNIT/ML ~~LOC~~ SOLN
0.0000 [IU] | Freq: Three times a day (TID) | SUBCUTANEOUS | Status: DC
  Administered 2018-06-28: 3 [IU] via SUBCUTANEOUS
  Administered 2018-06-28: 2 [IU] via SUBCUTANEOUS
  Administered 2018-06-29: 3 [IU] via SUBCUTANEOUS
  Administered 2018-06-30: 2 [IU] via SUBCUTANEOUS
  Administered 2018-06-30 – 2018-07-01 (×4): 1 [IU] via SUBCUTANEOUS
  Administered 2018-07-01: 3 [IU] via SUBCUTANEOUS
  Administered 2018-07-02: 1 [IU] via SUBCUTANEOUS
  Administered 2018-07-02: 2 [IU] via SUBCUTANEOUS

## 2018-06-28 MED ORDER — INSULIN ASPART 100 UNIT/ML ~~LOC~~ SOLN
0.0000 [IU] | Freq: Every day | SUBCUTANEOUS | Status: DC
  Administered 2018-06-29: 2 [IU] via SUBCUTANEOUS

## 2018-06-28 MED ORDER — MOMETASONE FURO-FORMOTEROL FUM 100-5 MCG/ACT IN AERO
2.0000 | INHALATION_SPRAY | Freq: Two times a day (BID) | RESPIRATORY_TRACT | Status: DC
  Administered 2018-06-28 – 2018-07-03 (×11): 2 via RESPIRATORY_TRACT
  Filled 2018-06-28: qty 8.8

## 2018-06-28 MED ORDER — ESCITALOPRAM OXALATE 20 MG PO TABS
20.0000 mg | ORAL_TABLET | Freq: Every day | ORAL | Status: DC
  Administered 2018-06-28 – 2018-07-03 (×6): 20 mg via ORAL
  Filled 2018-06-28 (×6): qty 1

## 2018-06-28 MED ORDER — ALBUTEROL SULFATE HFA 108 (90 BASE) MCG/ACT IN AERS: 2.0000 | INHALATION_SPRAY | Freq: Four times a day (QID) | RESPIRATORY_TRACT | Status: DC | PRN

## 2018-06-28 MED ORDER — LOPERAMIDE HCL 2 MG PO CAPS: 2.0000 mg | ORAL_CAPSULE | ORAL | Status: DC | PRN

## 2018-06-28 MED ORDER — UMECLIDINIUM BROMIDE 62.5 MCG/INH IN AEPB
1.0000 | INHALATION_SPRAY | Freq: Every day | RESPIRATORY_TRACT | Status: DC
  Administered 2018-06-28 – 2018-07-01 (×4): 1 via RESPIRATORY_TRACT
  Filled 2018-06-28: qty 7

## 2018-06-28 MED ORDER — ENOXAPARIN SODIUM 40 MG/0.4ML ~~LOC~~ SOLN
40.0000 mg | SUBCUTANEOUS | Status: DC
  Administered 2018-06-28 – 2018-07-03 (×6): 40 mg via SUBCUTANEOUS
  Filled 2018-06-28 (×6): qty 0.4

## 2018-06-28 MED ORDER — METFORMIN HCL 500 MG PO TABS
1000.0000 mg | ORAL_TABLET | Freq: Two times a day (BID) | ORAL | Status: DC
  Administered 2018-06-28 – 2018-07-03 (×11): 1000 mg via ORAL
  Filled 2018-06-28 (×11): qty 2

## 2018-06-28 MED ORDER — NICOTINE 14 MG/24HR TD PT24
14.0000 mg | MEDICATED_PATCH | Freq: Every day | TRANSDERMAL | Status: DC
  Administered 2018-06-28 – 2018-07-03 (×6): 14 mg via TRANSDERMAL
  Filled 2018-06-28 (×6): qty 1

## 2018-06-28 MED ORDER — ARIPIPRAZOLE 5 MG PO TABS
5.0000 mg | ORAL_TABLET | Freq: Every day | ORAL | Status: DC
  Administered 2018-06-28 – 2018-07-03 (×6): 5 mg via ORAL
  Filled 2018-06-28 (×6): qty 1

## 2018-06-28 MED ORDER — TAMSULOSIN HCL 0.4 MG PO CAPS
0.4000 mg | ORAL_CAPSULE | Freq: Every day | ORAL | Status: DC
  Administered 2018-06-28 – 2018-07-03 (×6): 0.4 mg via ORAL
  Filled 2018-06-28 (×6): qty 1

## 2018-06-28 MED ORDER — PRAVASTATIN SODIUM 10 MG PO TABS
20.0000 mg | ORAL_TABLET | Freq: Every day | ORAL | Status: DC
  Administered 2018-06-28 – 2018-07-02 (×5): 20 mg via ORAL
  Filled 2018-06-28 (×5): qty 2

## 2018-06-28 MED ORDER — ALBUTEROL SULFATE (2.5 MG/3ML) 0.083% IN NEBU
2.5000 mg | INHALATION_SOLUTION | RESPIRATORY_TRACT | Status: DC | PRN
  Administered 2018-06-28 – 2018-06-29 (×2): 2.5 mg via RESPIRATORY_TRACT
  Filled 2018-06-28 (×2): qty 3

## 2018-06-28 MED ORDER — ONDANSETRON HCL 4 MG PO TABS: 4.0000 mg | ORAL_TABLET | Freq: Four times a day (QID) | ORAL | Status: DC | PRN

## 2018-06-28 MED ORDER — LEVOFLOXACIN IN D5W 750 MG/150ML IV SOLN
750.0000 mg | INTRAVENOUS | Status: DC
  Administered 2018-06-28 – 2018-06-30 (×3): 750 mg via INTRAVENOUS
  Filled 2018-06-28 (×3): qty 150

## 2018-06-28 MED ORDER — ONDANSETRON HCL 4 MG/2ML IJ SOLN
4.0000 mg | Freq: Four times a day (QID) | INTRAMUSCULAR | Status: DC | PRN
  Administered 2018-06-28: 4 mg via INTRAVENOUS
  Filled 2018-06-28: qty 2

## 2018-06-28 MED ORDER — CARVEDILOL 3.125 MG PO TABS
3.1250 mg | ORAL_TABLET | Freq: Two times a day (BID) | ORAL | Status: DC
  Administered 2018-06-28 – 2018-07-03 (×11): 3.125 mg via ORAL
  Filled 2018-06-28 (×11): qty 1

## 2018-06-28 MED ORDER — SODIUM CHLORIDE 0.45 % IV SOLN
INTRAVENOUS | Status: DC
  Administered 2018-06-28: 05:00:00 via INTRAVENOUS

## 2018-06-28 MED ORDER — IPRATROPIUM-ALBUTEROL 0.5-2.5 (3) MG/3ML IN SOLN
3.0000 mL | Freq: Four times a day (QID) | RESPIRATORY_TRACT | Status: DC
  Administered 2018-06-28: 3 mL via RESPIRATORY_TRACT
  Filled 2018-06-28: qty 3

## 2018-06-28 MED ORDER — METHYLPREDNISOLONE SODIUM SUCC 125 MG IJ SOLR
125.0000 mg | Freq: Four times a day (QID) | INTRAMUSCULAR | Status: DC
  Administered 2018-06-28 – 2018-06-29 (×5): 125 mg via INTRAVENOUS
  Filled 2018-06-28 (×5): qty 2

## 2018-06-28 MED ORDER — IPRATROPIUM-ALBUTEROL 0.5-2.5 (3) MG/3ML IN SOLN
3.0000 mL | Freq: Two times a day (BID) | RESPIRATORY_TRACT | Status: DC
  Administered 2018-06-29 – 2018-07-01 (×5): 3 mL via RESPIRATORY_TRACT
  Filled 2018-06-28 (×7): qty 3

## 2018-06-28 NOTE — H&P (Signed)
History and Physical   Taylor [name] RKY:[id number] DOB: 03/07/1955 DOA: 06/27/2018  Referring MD/NP/PA: Dr. Dayna Barker  PCP: [name], NP   Outpatient Specialists: None  Patient coming from: Home  Chief Complaint: Shortness of breath  HPI: Taylor [name] is a 64 y.o. male with medical history significant of advanced COPD and asthma, bipolar disorder, continued tobacco abuser, diabetes, hypertension history of colon cancer, history of prostate cancer, recurrent hospitalization on chronic home oxygen who presented to the ER with progressive shortness of breath cough and wheezing.  Symptoms have been progressive in the last few days but worse today.  Associated with central chest pain.  Denied any fever or chills.  Patient also had tried home medications but no relief.Patient was seen in the ER and evaluated.  He was given multiple doses of nebulizer treatments but continues to have significant shortness of breath so he is being admitted to the hospital for treatment..  ED Course: Temperature 97.6 blood pressure 114/82 pulse 95 respiratory rate of 35 oxygen sats 94% on 4 L.  White count is 7.3 hemoglobin 12.6 and platelet count of 243.  Sodium 133 potassium 4.2 chloride 83 CO2 of 40 with BUN 10 creatinine 0.60 calcium 8.6.  Glucose is 124.  Chest x-ray showed no active disease.  Patient has received steroids nebulizer in the ER and being admitted for treatment. Review of Systems: As per HPI otherwise 10 point review of systems negative.    Past Medical History:  Diagnosis Date  . Acute on chronic respiratory failure ([place])   . Asthma   . Bipolar 1 disorder ([place]) 02/05/2012  . Colon cancer ([place]) 04/11/11   adenocarcinoma of colon, 7/19 nodes pos.FINISHED CHEMO/DR. SHERRILL  . COPD (chronic obstructive pulmonary disease) ([place])    SMOKER  . Depression   . Dyspnea   . Emphysema of lung ([place])   . Full dentures   . Hemorrhoids   . On home oxygen therapy    "3L; 24/7"  (05/29/2017)  . Oxygen deficiency   . Pneumonia ~ 2016   "double pneumonia"  . Prostate cancer ([place])    Gleason score = 7, supposed to have radiation therapy but he has not followed up (05/29/2017)  . Rib fractures    hx of    Past Surgical History:  Procedure Laterality Date  . COLON SURGERY  04/11/11   Sigmoid colectomy  . COLOSTOMY REVISION  04/11/2011   Procedure: COLON RESECTION SIGMOID;  Surgeon: [name], MD;  Location: [facility];  Service: General;  Laterality: N/A;  low anterior colon resection   . GOLD SEED IMPLANT N/A 02/24/2018   Procedure: GOLD SEED IMPLANT;  Surgeon: [name], MD;  Location: [facility];  Service: Urology;  Laterality: N/A;  . HERNIA REPAIR    . INGUINAL HERNIA REPAIR Right 05/01/2012   Procedure: HERNIA REPAIR INGUINAL ADULT;  Surgeon: [name], MD;  Location: [facility];  Service: General;  Laterality: Right;  . INSERTION OF MESH Right 05/01/2012   Procedure: INSERTION OF MESH;  Surgeon: [name], MD;  Location: [facility];  Service: General;  Laterality: Right;  . PORT-A-CATH REMOVAL Left 05/01/2012   Procedure: REMOVAL Infusion Port;  Surgeon: [name], MD;  Location: [facility];  Service: General;  Laterality: Left;  . PORTACATH PLACEMENT  05/02/2011   Procedure: INSERTION PORT-A-CATH;  Surgeon: [name], MD;  Location: [facility];  Service: General;  Laterality: N/A;  . PROSTATE BIOPSY    .  SPACE OAR INSTILLATION N/A 02/24/2018   Procedure: SPACE OAR INSTILLATION;  Surgeon: [name], MD;  Location: [facility];  Service: Urology;  Laterality: N/A;     reports that he quit smoking about 13 months ago. His smoking use included cigarettes. He has a 4.60 pack-year smoking history. He has quit using smokeless tobacco.  His smokeless tobacco use included chew. He reports previous alcohol use. He reports that he does not use drugs.  Allergies  Allergen Reactions  . Codeine Nausea And Vomiting    Family History  Problem Relation Age of Onset  . Heart disease  Father   . Heart failure Mother   . Heart disease Mother   . Lung cancer Maternal Uncle        smoked     Prior to Admission medications   Medication Sig Start Date End Date Taking? Authorizing Provider  albuterol (PROVENTIL HFA) 108 (90 Base) MCG/ACT inhaler Inhale 2 puffs into the lungs every 6 (six) hours as needed for up to 30 days for wheezing or shortness of breath. 05/28/18 06/27/18  Elodia Florence., MD  albuterol (PROVENTIL) (2.5 MG/3ML) 0.083% nebulizer solution Take 3 mLs (2.5 mg total) by nebulization every 4 (four) hours as needed for up to 30 days for wheezing or shortness of breath (if you can't catch your breath). 05/28/18 06/27/18  Elodia Florence., MD  ARIPiprazole (ABILIFY) 5 MG tablet Take 1 tablet (5 mg total) by mouth daily for 30 days. 05/28/18 06/27/18  Elodia Florence., MD  Blood Glucose Monitoring Suppl (ACCU-CHEK AVIVA) device Use as instructed 10/27/17 10/27/18  Clent Demark, PA-C  carvedilol (COREG) 3.125 MG tablet Take 1 tablet (3.125 mg total) by mouth 2 (two) times daily with a meal for 30 days. 05/28/18 06/27/18  Elodia Florence., MD  escitalopram (LEXAPRO) 20 MG tablet Take 1 tablet (20 mg total) by mouth daily for 30 days. 05/28/18 06/27/18  Elodia Florence., MD  glucose blood (ACCU-CHEK AVIVA) test strip 1 each by Other route as needed for other. Use as instructed 10/27/17   Clent Demark, PA-C  Lancets [facility]) lancets 1 each by Other route 2 (two) times daily. Use as instructed 10/27/17   Clent Demark, PA-C  loperamide (IMODIUM) 2 MG capsule Take 2 every 6 hours if continued diarrhea Patient taking differently: Take 2-4 mg by mouth See admin instructions. Take 2 mg by mouth as needed for diarrhea or loose stools and 4 mg every 6 hours if diarrhea continues 04/10/18   [name], MD  lovastatin (MEVACOR) 20 MG tablet Take 1 tablet (20 mg total) by mouth at bedtime for 30 days. 05/28/18 06/27/18  Elodia Florence., MD   metFORMIN (GLUCOPHAGE) 1000 MG tablet Take 1 tablet (1,000 mg total) by mouth 2 (two) times daily with a meal. 10/27/17   Clent Demark, PA-C  mometasone-formoterol [facility]) 100-5 MCG/ACT AERO Inhale 2 puffs into the lungs 2 (two) times daily for 30 days. 05/28/18 06/27/18  Elodia Florence., MD  nicotine (NICODERM CQ - DOSED IN MG/24 HOURS) 14 mg/24hr patch Place 1 patch (14 mg total) onto the skin daily. 05/29/18   Elodia Florence., MD  OXYGEN Inhale 3 L into the lungs continuous.     [provider]  predniSONE (DELTASONE) 10 MG tablet 40 mg x2 then 30 mg x3 days then 20 mg x 3 days then 10mg  x3 days then stop 06/16/18  Geradine Girt, DO  tamsulosin (FLOMAX) 0.4 MG CAPS capsule Take 1 capsule (0.4 mg total) by mouth daily. 06/17/18   Geradine Girt, DO  tiotropium (SPIRIVA) 18 MCG inhalation capsule Place 1 capsule (18 mcg total) into inhaler and inhale daily. 04/06/18   [name], MD    Physical Exam: Vitals:   06/27/18 2214 06/27/18 2300 06/28/18 0030 06/28/18 0036  BP: 107/73 114/82 106/69   Pulse: 86 74  70  Resp: (!) 35 13 19 (!) 27  Temp: 97.6 F (36.4 C)     TempSrc: Temporal     SpO2: 100% 100%  100%      Constitutional: Acutely ill looking with mild respiratory distress Vitals:   06/27/18 2214 06/27/18 2300 06/28/18 0030 06/28/18 0036  BP: 107/73 114/82 106/69   Pulse: 86 74  70  Resp: (!) 35 13 19 (!) 27  Temp: 97.6 F (36.4 C)     TempSrc: Temporal     SpO2: 100% 100%  100%   Eyes: PERRL, lids and conjunctivae normal ENMT: Mucous membranes are moist. Posterior pharynx clear of any exudate or lesions.Normal dentition.  Neck: normal, supple, no masses, no thyromegaly Respiratory: Decreased air entry bilaterally with mild expiratory wheezing, no crackles. Normal respiratory effort. No accessory muscle use.  Cardiovascular: Regular rate and rhythm, no murmurs / rubs / gallops. No extremity edema. 2+ pedal pulses. No carotid bruits.  Abdomen:  no tenderness, no masses palpated. No hepatosplenomegaly. Bowel sounds positive.  Musculoskeletal: no clubbing / cyanosis. No joint deformity upper and lower extremities. Good ROM, no contractures. Normal muscle tone.  Skin: no rashes, lesions, ulcers. No induration Neurologic: CN 2-12 grossly intact. Sensation intact, DTR normal. Strength 5/5 in all 4.  Psychiatric: Normal judgment and insight. Alert and oriented x 3. Normal mood.     Labs on Admission: I have personally reviewed following labs and imaging studies  CBC: Recent Labs  Lab 06/27/18 2242  WBC 7.3  NEUTROABS 6.0  HGB 12.6*  HCT 39.6  MCV 97.8  PLT 622   Basic Metabolic Panel: Recent Labs  Lab 06/27/18 2242  NA 133*  K 4.2  CL 83*  CO2 40*  GLUCOSE 124*  BUN 10  CREATININE 0.60*  CALCIUM 8.6*   GFR: Estimated Creatinine Clearance: 88.6 mL/min (A) (by C-G formula based on SCr of 0.6 mg/dL (L)). Liver Function Tests: No results for input(s): AST, ALT, ALKPHOS, BILITOT, PROT, ALBUMIN in the last 168 hours. No results for input(s): LIPASE, AMYLASE in the last 168 hours. No results for input(s): AMMONIA in the last 168 hours. Coagulation Profile: No results for input(s): INR, PROTIME in the last 168 hours. Cardiac Enzymes: Recent Labs  Lab 06/27/18 2242  TROPONINI <0.03   BNP (last 3 results) No results for input(s): PROBNP in the last 8760 hours. HbA1C: No results for input(s): HGBA1C in the last 72 hours. CBG: No results for input(s): GLUCAP in the last 168 hours. Lipid Profile: No results for input(s): CHOL, HDL, LDLCALC, TRIG, CHOLHDL, LDLDIRECT in the last 72 hours. Thyroid Function Tests: No results for input(s): TSH, T4TOTAL, FREET4, T3FREE, THYROIDAB in the last 72 hours. Anemia Panel: No results for input(s): VITAMINB12, FOLATE, FERRITIN, TIBC, IRON, RETICCTPCT in the last 72 hours. Urine analysis:    Component Value Date/Time   COLORURINE STRAW (A) 05/15/2018 1600   APPEARANCEUR CLEAR  05/15/2018 1600   LABSPEC >1.046 (H) 05/15/2018 1600   PHURINE 6.0 05/15/2018 1600   GLUCOSEU NEGATIVE 05/15/2018 1600  HGBUR NEGATIVE 05/15/2018 1600   BILIRUBINUR NEGATIVE 05/15/2018 1600   KETONESUR NEGATIVE 05/15/2018 1600   PROTEINUR NEGATIVE 05/15/2018 1600   UROBILINOGEN 1.0 01/09/2013 1843   NITRITE NEGATIVE 05/15/2018 1600   LEUKOCYTESUR NEGATIVE 05/15/2018 1600   Sepsis Labs: @LABRCNTIP (procalcitonin:4,lacticidven:4) ) Recent Results (from the past 240 hour(s))  SARS Coronavirus 2 (CEPHEID- Performed in [place] hospital lab), Hosp Order     Status: None   Collection Time: 06/27/18 10:42 PM  Result Value Ref Range Status   SARS Coronavirus 2 NEGATIVE NEGATIVE Final    Comment: (NOTE) If result is NEGATIVE SARS-CoV-2 target nucleic acids are NOT DETECTED. The SARS-CoV-2 RNA is generally detectable in upper and lower  respiratory specimens during the acute phase of infection. The lowest  concentration of SARS-CoV-2 viral copies this assay can detect is 250  copies / mL. A negative result does not preclude SARS-CoV-2 infection  and should not be used as the sole basis for treatment or other  patient management decisions.  A negative result may occur with  improper specimen collection / handling, submission of specimen other  than nasopharyngeal swab, presence of viral mutation(s) within the  areas targeted by this assay, and inadequate number of viral copies  (<250 copies / mL). A negative result must be combined with clinical  observations, patient history, and epidemiological information. If result is POSITIVE SARS-CoV-2 target nucleic acids are DETECTED. The SARS-CoV-2 RNA is generally detectable in upper and lower  respiratory specimens dur ing the acute phase of infection.  Positive  results are indicative of active infection with SARS-CoV-2.  Clinical  correlation with patient history and other diagnostic information is  necessary to determine patient  infection status.  Positive results do  not rule out bacterial infection or co-infection with other viruses. If result is PRESUMPTIVE POSTIVE SARS-CoV-2 nucleic acids MAY BE PRESENT.   A presumptive positive result was obtained on the submitted specimen  and confirmed on repeat testing.  While 2019 novel coronavirus  (SARS-CoV-2) nucleic acids may be present in the submitted sample  additional confirmatory testing may be necessary for epidemiological  and / or clinical management purposes  to differentiate between  SARS-CoV-2 and other Sarbecovirus currently known to infect humans.  If clinically indicated additional testing with an alternate test  methodology [phone]) is advised. The SARS-CoV-2 RNA is generally  detectable in upper and lower respiratory sp ecimens during the acute  phase of infection. The expected result is Negative. Fact Sheet for Patients:  StrictlyIdeas.no Fact Sheet for Healthcare Providers: BankingDealers.co.za This test is not yet approved or cleared by the [place] FDA and has been authorized for detection and/or diagnosis of SARS-CoV-2 by FDA under an Emergency Use Authorization (EUA).  This EUA will remain in effect (meaning this test can be used) for the duration of the COVID-19 declaration under Section 564(b)(1) of the Act, 21 U.S.C. section 360bbb-3(b)(1), unless the authorization is terminated or revoked sooner. Performed at [facility], [place] [address], [place], [place] [address]      Radiological Exams on Admission: Dg Chest Portable 1 View  Result Date: 06/27/2018 CLINICAL DATA:  64 year old male with shortness of breath EXAM: PORTABLE CHEST 1 VIEW COMPARISON:  Chest radiograph dated 06/12/2018 FINDINGS: There is hyperexpansion of the lungs with flattening of the diaphragms, likely representing Bragg ground of air trapping and COPD. There is no focal consolidation, pleural effusion, or  pneumothorax. The cardiac silhouette is within normal limits. No acute osseous pathology. IMPRESSION: No active  disease. Electronically Signed   By: Anner Crete M.D.   On: 06/27/2018 23:10    EKG: Independently reviewed.  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  Patient will be treated with steroids nebulizers and antibiotics.  Follow response closely.  #2 COPD exacerbation: Following COPD gold protocol.  #3 bipolar disorder: Continue home regimen and monitor closely  #4 hypertension: Blood pressure at this point is controlled.  I will continue with his home regimen.  #5 diabetes: This will be worsened by steroid use.  Sliding scale insulin with home regimen.  #5 history of alcohol and tobacco abuse: Counseling provided.  We will add nicotine patch.   DVT prophylaxis: Lovenox Code Status: Full code Family Communication: Care discussed with patient fully Disposition Plan: To be determined Consults called: None Admission status: Observation  Severity of Illness: The appropriate patient status for this patient is OBSERVATION. Observation status is judged to be reasonable and necessary in order to provide the required intensity of service to ensure the patient's safety. The patient's presenting symptoms, physical exam findings, and initial radiographic and laboratory data in the context of their medical condition is felt to place them at decreased risk for further clinical deterioration. Furthermore, it is anticipated that the patient will be medically stable for  discharge from the hospital within 2 midnights of admission. The following factors support the patient status of observation.   " The patient's presenting symptoms include shortness of breath and cough. " The physical exam findings include marked expiratory wheezing. " The initial radiographic and laboratory data are no pneumonia.     Barbette Merino MD [facility] Pager [phone]  If 7PM-7AM, please contact night-coverage [email or web address] Password [email or web address]  06/28/2018, 1:55 AM

## 2018-06-28 NOTE — ED Notes (Signed)
ED TO INPATIENT HANDOFF REPORT  ED Nurse Name and Phone #: Annamary Carolin Carlton  S Name/Age/Gender Taylor [name] 63 y.o. male Room/Bed: 016C/016C  Code Status   Code Status: Prior  Home/SNF/Other Home Patient oriented to: self, place, time and situation Is this baseline? Yes   Triage Complete: Triage complete  Chief Complaint CP, SHOB  Triage Note Pt from home with ems for CP/SOB since yesterday, hx of COPD on 3L [place] all the time. EKG ST with PACs. 324 ASA, 1 Nitro, 2mg  Mag and 125 solumedrol Iv given en route. Pt states his Cp has improved some and his work of breathing has improved. Wheezing noted bilaterally. Pt arrives to ED alert, oriented.    Allergies Allergies  Allergen Reactions  . Codeine Nausea And Vomiting    Level of Care/Admitting Diagnosis ED Disposition    ED Disposition Condition [facility] Area: [place] [100100]  Level of Care: Telemetry Medical [104]  I expect the patient will be discharged within 24 hours: Yes  LOW acuity---Tx typically complete <24 hrs---ACUTE conditions typically can be evaluated <24 hours---LABS likely to return to acceptable levels <24 hours---IS near functional baseline---EXPECTED to return to current living arrangement---NOT newly hypoxic: Meets criteria for 5C-Observation unit  Covid Evaluation: Confirmed COVID Negative  Diagnosis: Acute on chronic respiratory failure with hypoxia and hypercapnia ([place]) [[id number]]  Admitting Physician: Elwyn Reach [[id number]]  Attending Physician: Elwyn Reach [[id number]]  PT Class (Do Not Modify): Observation [104]  PT Acc Code (Do Not Modify): Observation [10022]       B Medical/Surgery History Past Medical History:  Diagnosis Date  . Acute on chronic respiratory failure ([place])   . Asthma   . Bipolar 1 disorder ([place]) 02/05/2012  . Colon cancer ([place]) 04/11/11   adenocarcinoma of colon, 7/19 nodes pos.FINISHED CHEMO/DR. SHERRILL  . COPD (chronic  obstructive pulmonary disease) ([place])    SMOKER  . Depression   . Dyspnea   . Emphysema of lung ([place])   . Full dentures   . Hemorrhoids   . On home oxygen therapy    "3L; 24/7" (05/29/2017)  . Oxygen deficiency   . Pneumonia ~ 2016   "double pneumonia"  . Prostate cancer ([place])    Gleason score = 7, supposed to have radiation therapy but he has not followed up (05/29/2017)  . Rib fractures    hx of   Past Surgical History:  Procedure Laterality Date  . COLON SURGERY  04/11/11   Sigmoid colectomy  . COLOSTOMY REVISION  04/11/2011   Procedure: COLON RESECTION SIGMOID;  Surgeon: [name], MD;  Location: [facility];  Service: General;  Laterality: N/A;  low anterior colon resection   . GOLD SEED IMPLANT N/A 02/24/2018   Procedure: GOLD SEED IMPLANT;  Surgeon: [name], MD;  Location: [facility];  Service: Urology;  Laterality: N/A;  . HERNIA REPAIR    . INGUINAL HERNIA REPAIR Right 05/01/2012   Procedure: HERNIA REPAIR INGUINAL ADULT;  Surgeon: [name], MD;  Location: [facility];  Service: General;  Laterality: Right;  . INSERTION OF MESH Right 05/01/2012   Procedure: INSERTION OF MESH;  Surgeon: [name], MD;  Location: [facility];  Service: General;  Laterality: Right;  . PORT-A-CATH REMOVAL Left 05/01/2012   Procedure: REMOVAL Infusion Port;  Surgeon: [name], MD;  Location: [facility];  Service: General;  Laterality: Left;  . PORTACATH PLACEMENT  05/02/2011   Procedure: INSERTION  PORT-A-CATH;  Surgeon: [name], MD;  Location: [facility];  Service: General;  Laterality: N/A;  . PROSTATE BIOPSY    . SPACE OAR INSTILLATION N/A 02/24/2018   Procedure: SPACE OAR INSTILLATION;  Surgeon: [name], MD;  Location: [facility];  Service: Urology;  Laterality: N/A;     A IV Location/Drains/Wounds Patient Lines/Drains/Airways Status   Active Line/Drains/Airways    Name:   Placement date:   Placement time:   Site:   Days:   Peripheral IV 06/27/18 Right Antecubital   06/27/18    2208     Antecubital   1          Intake/Output Last 24 hours No intake or output data in the 24 hours ending 06/28/18 0932  Labs/Imaging Results for orders placed or performed during the hospital encounter of 06/27/18 (from the past 48 hour(s))  SARS Coronavirus 2 (CEPHEID- Performed in [facility] hospital lab), Hosp Order     Status: None   Collection Time: 06/27/18 10:42 PM  Result Value Ref Range   SARS Coronavirus 2 NEGATIVE NEGATIVE    Comment: (NOTE) If result is NEGATIVE SARS-CoV-2 target nucleic acids are NOT DETECTED. The SARS-CoV-2 RNA is generally detectable in upper and lower  respiratory specimens during the acute phase of infection. The lowest  concentration of SARS-CoV-2 viral copies this assay can detect is 250  copies / mL. A negative result does not preclude SARS-CoV-2 infection  and should not be used as the sole basis for treatment or other  patient management decisions.  A negative result may occur with  improper specimen collection / handling, submission of specimen other  than nasopharyngeal swab, presence of viral mutation(s) within the  areas targeted by this assay, and inadequate number of viral copies  (<250 copies / mL). A negative result must be combined with clinical  observations, patient history, and epidemiological information. If result is POSITIVE SARS-CoV-2 target nucleic acids are DETECTED. The SARS-CoV-2 RNA is generally detectable in upper and lower  respiratory specimens dur ing the acute phase of infection.  Positive  results are indicative of active infection with SARS-CoV-2.  Clinical  correlation with patient history and other diagnostic information is  necessary to determine patient infection status.  Positive results do  not rule out bacterial infection or co-infection with other viruses. If result is PRESUMPTIVE POSTIVE SARS-CoV-2 nucleic acids MAY BE PRESENT.   A presumptive positive result was obtained on the submitted specimen  and  confirmed on repeat testing.  While 2019 novel coronavirus  (SARS-CoV-2) nucleic acids may be present in the submitted sample  additional confirmatory testing may be necessary for epidemiological  and / or clinical management purposes  to differentiate between  SARS-CoV-2 and other Sarbecovirus currently known to infect humans.  If clinically indicated additional testing with an alternate test  methodology [phone]) is advised. The SARS-CoV-2 RNA is generally  detectable in upper and lower respiratory sp ecimens during the acute  phase of infection. The expected result is Negative. Fact Sheet for Patients:  StrictlyIdeas.no Fact Sheet for Healthcare Providers: BankingDealers.co.za This test is not yet approved or cleared by the [place] FDA and has been authorized for detection and/or diagnosis of SARS-CoV-2 by FDA under an Emergency Use Authorization (EUA).  This EUA will remain in effect (meaning this test can be used) for the duration of the COVID-19 declaration under Section 564(b)(1) of the Act, 21 U.S.C. section 360bbb-3(b)(1), unless the authorization is terminated or revoked sooner. Performed at  [facility], [place] [address] [place], [place] [address]   Troponin I - ONCE - STAT     Status: None   Collection Time: 06/27/18 10:42 PM  Result Value Ref Range   Troponin I <0.03 <0.03 ng/mL    Comment: Performed at [facility], [place] [address] [place], [place] [address]  CBC with Differential/Platelet     Status: Abnormal   Collection Time: 06/27/18 10:42 PM  Result Value Ref Range   WBC 7.3 4.0 - 10.5 K/uL   RBC 4.05 (L) 4.22 - 5.81 MIL/uL   Hemoglobin 12.6 (L) 13.0 - 17.0 g/dL   HCT 39.6 39.0 - 52.0 %   MCV 97.8 80.0 - 100.0 fL   MCH 31.1 26.0 - 34.0 pg   MCHC 31.8 30.0 - 36.0 g/dL   RDW 12.7 11.5 - 15.5 %   Platelets 242 150 - 400 K/uL   nRBC 0.0 0.0 - 0.2 %   Neutrophils Relative % 84 %   Neutro Abs 6.0  1.7 - 7.7 K/uL   Lymphocytes Relative 8 %   Lymphs Abs 0.6 (L) 0.7 - 4.0 K/uL   Monocytes Relative 6 %   Monocytes Absolute 0.5 0.1 - 1.0 K/uL   Eosinophils Relative 1 %   Eosinophils Absolute 0.1 0.0 - 0.5 K/uL   Basophils Relative 0 %   Basophils Absolute 0.0 0.0 - 0.1 K/uL   Immature Granulocytes 1 %   Abs Immature Granulocytes 0.07 0.00 - 0.07 K/uL    Comment: Performed at [facility] [address] [place], [place] [address]  Basic metabolic panel     Status: Abnormal   Collection Time: 06/27/18 10:42 PM  Result Value Ref Range   Sodium 133 (L) 135 - 145 mmol/L   Potassium 4.2 3.5 - 5.1 mmol/L   Chloride 83 (L) 98 - 111 mmol/L   CO2 40 (H) 22 - 32 mmol/L   Glucose, Bld 124 (H) 70 - 99 mg/dL   BUN 10 8 - 23 mg/dL   Creatinine, Ser 0.60 (L) 0.61 - 1.24 mg/dL   Calcium 8.6 (L) 8.9 - 10.3 mg/dL   GFR calc non Af Amer >60 >60 mL/min   GFR calc Af Amer >60 >60 mL/min   Anion gap 10 5 - 15    Comment: Performed at [facility] [address], [place], [place] [address]   Dg Chest Portable 1 View  Result Date: 06/27/2018 CLINICAL DATA:  63 year old male with shortness of breath EXAM: PORTABLE CHEST 1 VIEW COMPARISON:  Chest radiograph dated 06/12/2018 FINDINGS: There is hyperexpansion of the lungs with flattening of the diaphragms, likely representing Bragg ground of air trapping and COPD. There is no focal consolidation, pleural effusion, or pneumothorax. The cardiac silhouette is within normal limits. No acute osseous pathology. IMPRESSION: No active disease. Electronically Signed   By: Anner Crete M.D.   On: 06/27/2018 23:10    Pending Labs [employer] (From admission, onward)    Start     Ordered   Signed and Held  CBC  (enoxaparin (LOVENOX)    CrCl >/= 30 ml/min)  Once,   R    Comments:  Baseline for enoxaparin therapy IF NOT ALREADY DRAWN.  Notify MD if PLT < 100 K.    Signed and Held   Signed and Held  Creatinine, serum  (enoxaparin (LOVENOX)     CrCl >/= 30 ml/min)  Once,   R    Comments:  Baseline for enoxaparin therapy IF NOT ALREADY DRAWN.    Signed and Held   Signed and Held  Creatinine, serum  (enoxaparin (LOVENOX)    CrCl >/= 30 ml/min)  Weekly,   R    Comments:  while on enoxaparin therapy    Signed and Held   Signed and Held  Comprehensive metabolic panel  Tomorrow morning,   R     Signed and Held   Signed and Held  CBC  Tomorrow morning,   R     Signed and Held          Vitals/Pain Today's Vitals   06/28/18 0200 06/28/18 0201 06/28/18 0203 06/28/18 0209  BP: (!) 123/95     Pulse:  73 72 73  Resp: 11 16 20 19   Temp:      TempSrc:      SpO2:  98% 95% 96%  PainSc:        Isolation Precautions Droplet and Contact precautions  Medications Medications  AeroChamber Plus Flo-Vu Large MISC (has no administration in time range)  albuterol (PROVENTIL,VENTOLIN) solution continuous neb (0 mg/hr Nebulization Stopped 06/28/18 0206)  albuterol (VENTOLIN HFA) 108 (90 Base) MCG/ACT inhaler 8 puff (8 puffs Inhalation Given 06/27/18 2245)  AeroChamber Plus Flo-Vu Large MISC 1 each (1 each Other Given 06/27/18 2245)    Mobility walks Low fall risk   Focused Assessments Pulmonary Assessment Handoff:  Lung sounds: Bilateral Breath Sounds: Diminished L Breath Sounds: Diminished R Breath Sounds: Diminished O2 Device: Nasal Cannula O2 Flow Rate (L/min): 3 L/min      R Recommendations: See Admitting Provider Note  Report given to:   Additional Notes:

## 2018-06-28 NOTE — ED Notes (Signed)
Attempted to give report, 2W RN requested to read chart and call this RN back.

## 2018-06-28 NOTE — Progress Notes (Signed)
Patient admitted to 2 W. 11 with he diagnosis  Respiratory failure with hypoxia. A & O x 4. Denied any acute pain. Oriented patient to his room and ascom and staff. Bed alarm activated. Patient voiced no concerns. Will continue to monitor.

## 2018-06-28 NOTE — ED Notes (Signed)
Pt given drink and sandwich at this time.

## 2018-06-28 NOTE — Progress Notes (Signed)
Patient had 12 beats of VT and was asymptomatic on re-assessment. Notified Bodenheimer NP via text and did not received a new order. Will continue to monitor.

## 2018-06-28 NOTE — Progress Notes (Signed)
Patient admitted after midnight, please see H&P.  Known COPD and continue to smoke and drink alcohol. Here again with SOB.  Many social issues. Eulogio Bear DO

## 2018-06-29 ENCOUNTER — Ambulatory Visit: Payer: Medicaid Other

## 2018-06-29 DIAGNOSIS — E099 Drug or chemical induced diabetes mellitus without complications: Secondary | ICD-10-CM | POA: Diagnosis not present

## 2018-06-29 DIAGNOSIS — J9621 Acute and chronic respiratory failure with hypoxia: Secondary | ICD-10-CM | POA: Diagnosis present

## 2018-06-29 DIAGNOSIS — Z8546 Personal history of malignant neoplasm of prostate: Secondary | ICD-10-CM | POA: Diagnosis not present

## 2018-06-29 DIAGNOSIS — F319 Bipolar disorder, unspecified: Secondary | ICD-10-CM | POA: Diagnosis present

## 2018-06-29 DIAGNOSIS — E0965 Drug or chemical induced diabetes mellitus with hyperglycemia: Secondary | ICD-10-CM | POA: Diagnosis present

## 2018-06-29 DIAGNOSIS — I10 Essential (primary) hypertension: Secondary | ICD-10-CM | POA: Diagnosis present

## 2018-06-29 DIAGNOSIS — T380X5A Adverse effect of glucocorticoids and synthetic analogues, initial encounter: Secondary | ICD-10-CM | POA: Diagnosis present

## 2018-06-29 DIAGNOSIS — Z9981 Dependence on supplemental oxygen: Secondary | ICD-10-CM | POA: Diagnosis not present

## 2018-06-29 DIAGNOSIS — D696 Thrombocytopenia, unspecified: Secondary | ICD-10-CM | POA: Diagnosis present

## 2018-06-29 DIAGNOSIS — J441 Chronic obstructive pulmonary disease with (acute) exacerbation: Secondary | ICD-10-CM | POA: Diagnosis present

## 2018-06-29 DIAGNOSIS — F17200 Nicotine dependence, unspecified, uncomplicated: Secondary | ICD-10-CM

## 2018-06-29 DIAGNOSIS — F1010 Alcohol abuse, uncomplicated: Secondary | ICD-10-CM | POA: Diagnosis present

## 2018-06-29 DIAGNOSIS — Z7984 Long term (current) use of oral hypoglycemic drugs: Secondary | ICD-10-CM | POA: Diagnosis not present

## 2018-06-29 DIAGNOSIS — J9622 Acute and chronic respiratory failure with hypercapnia: Secondary | ICD-10-CM | POA: Diagnosis present

## 2018-06-29 DIAGNOSIS — Z85038 Personal history of other malignant neoplasm of large intestine: Secondary | ICD-10-CM | POA: Diagnosis not present

## 2018-06-29 DIAGNOSIS — Z66 Do not resuscitate: Secondary | ICD-10-CM | POA: Diagnosis present

## 2018-06-29 DIAGNOSIS — R251 Tremor, unspecified: Secondary | ICD-10-CM | POA: Diagnosis present

## 2018-06-29 DIAGNOSIS — Z8249 Family history of ischemic heart disease and other diseases of the circulatory system: Secondary | ICD-10-CM | POA: Diagnosis not present

## 2018-06-29 DIAGNOSIS — Z20828 Contact with and (suspected) exposure to other viral communicable diseases: Secondary | ICD-10-CM | POA: Diagnosis present

## 2018-06-29 LAB — GLUCOSE, CAPILLARY
Glucose-Capillary: 222 mg/dL — ABNORMAL HIGH (ref 70–99)
Glucose-Capillary: 152 mg/dL — ABNORMAL HIGH (ref 70–99)
Glucose-Capillary: 135 mg/dL — ABNORMAL HIGH (ref 70–99)
Glucose-Capillary: 206 mg/dL — ABNORMAL HIGH (ref 70–99)

## 2018-06-29 MED ORDER — LORAZEPAM 1 MG PO TABS
1.0000 mg | ORAL_TABLET | Freq: Four times a day (QID) | ORAL | Status: DC | PRN
  Administered 2018-06-30 – 2018-07-01 (×2): 1 mg via ORAL
  Filled 2018-06-29 (×2): qty 1

## 2018-06-29 MED ORDER — THIAMINE HCL 100 MG/ML IJ SOLN: 100.0000 mg | Freq: Every day | INTRAMUSCULAR | Status: DC

## 2018-06-29 MED ORDER — METHYLPREDNISOLONE SODIUM SUCC 40 MG IJ SOLR
40.0000 mg | Freq: Four times a day (QID) | INTRAMUSCULAR | Status: DC
  Administered 2018-06-29 – 2018-07-01 (×10): 40 mg via INTRAVENOUS
  Filled 2018-06-29 (×10): qty 1

## 2018-06-29 MED ORDER — FOLIC ACID 1 MG PO TABS
1.0000 mg | ORAL_TABLET | Freq: Every day | ORAL | Status: DC
  Administered 2018-06-29 – 2018-07-03 (×5): 1 mg via ORAL
  Filled 2018-06-29 (×5): qty 1

## 2018-06-29 MED ORDER — HYDROXYZINE HCL 10 MG PO TABS
10.0000 mg | ORAL_TABLET | Freq: Three times a day (TID) | ORAL | Status: DC | PRN
  Administered 2018-07-03: 10 mg via ORAL
  Filled 2018-06-29 (×2): qty 1

## 2018-06-29 MED ORDER — VITAMIN B-1 100 MG PO TABS
100.0000 mg | ORAL_TABLET | Freq: Every day | ORAL | Status: DC
  Administered 2018-06-29 – 2018-07-03 (×5): 100 mg via ORAL
  Filled 2018-06-29 (×5): qty 1

## 2018-06-29 MED ORDER — ADULT MULTIVITAMIN W/MINERALS CH
1.0000 | ORAL_TABLET | Freq: Every day | ORAL | Status: DC
  Administered 2018-06-29 – 2018-07-03 (×5): 1 via ORAL
  Filled 2018-06-29 (×5): qty 1

## 2018-06-29 MED ORDER — LORAZEPAM 2 MG/ML IJ SOLN
1.0000 mg | Freq: Four times a day (QID) | INTRAMUSCULAR | Status: DC | PRN
  Administered 2018-06-29 – 2018-07-01 (×2): 1 mg via INTRAVENOUS
  Filled 2018-06-29 (×2): qty 1

## 2018-06-29 NOTE — Progress Notes (Signed)
Progress Note    Taylor [name]  DEY:[id number] DOB: 09/02/1955  DOA: 06/27/2018 PCP: [name], NP    Brief Narrative:    Medical records reviewed and are as summarized below:  Taylor [name] is an 63 y.o. male with medical history significant of advanced COPD and asthma, bipolar disorder, continued tobacco abuser, diabetes, hypertension history of colon cancer, history of prostate cancer, recurrent hospitalization on chronic home oxygen who presented to the ER with progressive shortness of breath cough and wheezing.  Symptoms have been progressive in the last few days but worse today.   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  diabetes:  -will be worsened by steroid use -Sliding scale insulin with home regimen.   alcohol and tobacco abuse  - nicotine patch -CIWA   Family Communication/Anticipated D/C date and plan/Code Status   DVT prophylaxis: Lovenox ordered. Code Status: DNR Family Communication: patient Disposition Plan: 1-2 more days in the hospital   Medical Consultants:    None.    Subjective:   Feeling very anxious  Objective:    Vitals:   06/29/18 0600 06/29/18 0732 06/29/18 0948 06/29/18 0950  BP:  109/73    Pulse:  93  89  Resp:  19  19  Temp:  98.2 F (36.8 C)    TempSrc:  Oral    SpO2: 96% 100% 98% 98%  Weight:      Height:        Intake/Output Summary (Last 24  hours) at 06/29/2018 1101 Last data filed at 06/29/2018 1052 Gross per 24 hour  Intake 1034.14 ml  Output 2700 ml  Net -1665.86 ml   Filed Weights   06/28/18 0316  Weight: 67.7 kg    Exam: In bed, NAD rrr Not moving much air but no diffuse wheezing +BS, soft Tremor in right hand  Data Reviewed:   I have personally reviewed following labs and imaging studies:  Labs: Labs show the following:   Basic Metabolic Panel: Recent Labs  Lab 06/27/18 2242 06/28/18 0654  NA 133* 135  K 4.2 4.2  CL 83* 86*  CO2 40* 39*  GLUCOSE 124* 258*  BUN 10 12  CREATININE 0.60* 0.62  CALCIUM 8.6* 8.5*  MG  --  2.4   GFR Estimated Creatinine Clearance: 90.5 mL/min (by C-G formula based on SCr of 0.62 mg/dL). Liver Function Tests: Recent Labs  Lab 06/28/18 0654  AST 19  ALT 27  ALKPHOS 55  BILITOT 0.6  PROT 5.5*  ALBUMIN 3.5   No results for input(s): LIPASE, AMYLASE in the last 168 hours. No results for input(s): AMMONIA in the last 168 hours. Coagulation profile No results for input(s): INR, PROTIME in the last 168 hours.  CBC: Recent Labs  Lab 06/27/18 2242 06/28/18 0654  WBC 7.3 4.1  NEUTROABS 6.0  --   HGB 12.6* 12.7*  HCT 39.6 39.3  MCV 97.8 96.1  PLT 242 213  Cardiac Enzymes: Recent Labs  Lab 06/27/18 2242  TROPONINI <0.03   BNP (last 3 results) No results for input(s): PROBNP in the last 8760 hours. CBG: Recent Labs  Lab 06/28/18 1151 06/28/18 1628 06/28/18 1659 06/28/18 2016 06/29/18 0729  GLUCAP 183* 49* 83 133* 222*   D-Dimer: No results for input(s): DDIMER in the last 72 hours. Hgb A1c: No results for input(s): HGBA1C in the last 72 hours. Lipid Profile: No results for input(s): CHOL, HDL, LDLCALC, TRIG, CHOLHDL, LDLDIRECT in the last 72 hours. Thyroid function studies: No results for input(s): TSH, T4TOTAL, T3FREE, THYROIDAB in the last 72 hours.  Invalid input(s): FREET3 Anemia work up: No results for input(s): VITAMINB12, FOLATE,  FERRITIN, TIBC, IRON, RETICCTPCT in the last 72 hours. Sepsis Labs: Recent Labs  Lab 06/27/18 2242 06/28/18 0654  WBC 7.3 4.1    Microbiology Recent Results (from the past 240 hour(s))  SARS Coronavirus 2 (CEPHEID- Performed in [facility] hospital lab), Hosp Order     Status: None   Collection Time: 06/27/18 10:42 PM  Result Value Ref Range Status   SARS Coronavirus 2 NEGATIVE NEGATIVE Final    Comment: (NOTE) If result is NEGATIVE SARS-CoV-2 target nucleic acids are NOT DETECTED. The SARS-CoV-2 RNA is generally detectable in upper and lower  respiratory specimens during the acute phase of infection. The lowest  concentration of SARS-CoV-2 viral copies this assay can detect is 250  copies / mL. A negative result does not preclude SARS-CoV-2 infection  and should not be used as the sole basis for treatment or other  patient management decisions.  A negative result may occur with  improper specimen collection / handling, submission of specimen other  than nasopharyngeal swab, presence of viral mutation(s) within the  areas targeted by this assay, and inadequate number of viral copies  (<250 copies / mL). A negative result must be combined with clinical  observations, patient history, and epidemiological information. If result is POSITIVE SARS-CoV-2 target nucleic acids are DETECTED. The SARS-CoV-2 RNA is generally detectable in upper and lower  respiratory specimens dur ing the acute phase of infection.  Positive  results are indicative of active infection with SARS-CoV-2.  Clinical  correlation with patient history and other diagnostic information is  necessary to determine patient infection status.  Positive results do  not rule out bacterial infection or co-infection with other viruses. If result is PRESUMPTIVE POSTIVE SARS-CoV-2 nucleic acids MAY BE PRESENT.   A presumptive positive result was obtained on the submitted specimen  and confirmed on repeat testing.  While 2019  novel coronavirus  (SARS-CoV-2) nucleic acids may be present in the submitted sample  additional confirmatory testing may be necessary for epidemiological  and / or clinical management purposes  to differentiate between  SARS-CoV-2 and other Sarbecovirus currently known to infect humans.  If clinically indicated additional testing with an alternate test  methodology [phone]) is advised. The SARS-CoV-2 RNA is generally  detectable in upper and lower respiratory sp ecimens during the acute  phase of infection. The expected result is Negative. Fact Sheet for Patients:  StrictlyIdeas.no Fact Sheet for Healthcare Providers: BankingDealers.co.za This test is not yet approved or cleared by the [place] FDA and has been authorized for detection and/or diagnosis of SARS-CoV-2 by FDA under an Emergency Use Authorization (EUA).  This EUA will remain in effect (meaning this test can be used) for the duration of the COVID-19 declaration under Section 564(b)(1) of the Act, 21 U.S.C. section 360bbb-3(b)(1), unless the  authorization is terminated or revoked sooner. Performed at [facility], [place] [address], [place], [place] [address]     Procedures and diagnostic studies:  Dg Chest Portable 1 View  Result Date: 06/27/2018 CLINICAL DATA:  63 year old male with shortness of breath EXAM: PORTABLE CHEST 1 VIEW COMPARISON:  Chest radiograph dated 06/12/2018 FINDINGS: There is hyperexpansion of the lungs with flattening of the diaphragms, likely representing Bragg ground of air trapping and COPD. There is no focal consolidation, pleural effusion, or pneumothorax. The cardiac silhouette is within normal limits. No acute osseous pathology. IMPRESSION: No active disease. Electronically Signed   By: Anner Crete M.D.   On: 06/27/2018 23:10    Medications:   . ARIPiprazole  5 mg Oral Daily  . carvedilol  3.125 mg Oral BID WC  . enoxaparin  (LOVENOX) injection  40 mg Subcutaneous Q24H  . escitalopram  20 mg Oral Daily  . folic acid  1 mg Oral Daily  . insulin aspart  0-5 Units Subcutaneous QHS  . insulin aspart  0-9 Units Subcutaneous TID WC  . ipratropium-albuterol  3 mL Nebulization BID  . metFORMIN  1,000 mg Oral BID WC  . methylPREDNISolone (SOLU-MEDROL) injection  40 mg Intravenous Q6H  . mometasone-formoterol  2 puff Inhalation BID  . multivitamin with minerals  1 tablet Oral Daily  . nicotine  14 mg Transdermal Daily  . pravastatin  20 mg Oral q1800  . tamsulosin  0.4 mg Oral Daily  . thiamine  100 mg Oral Daily  . umeclidinium bromide  1 puff Inhalation Daily   Continuous Infusions: . sodium chloride 75 mL/hr at 06/28/18 0459  . levofloxacin (LEVAQUIN) IV 750 mg (06/29/18 0423)     LOS: 0 days   Geradine Girt  [facility]   How to contact the [facility] Attending or Consulting provider [place] or covering provider during after hours [place], for this patient?  1. Check the care team in [facility] and look for a) attending/consulting [facility] provider listed and b) the [facility] team listed 2. Log into [email or web address] and use 's universal password to access. If you do not have the password, please contact the hospital operator. 3. Locate the [facility] provider you are looking for under [facility] and page to a number that you can be directly reached. 4. If you still have difficulty reaching the provider, please page the [facility] (Director on Call) for the Hospitalists listed on amion for assistance.  06/29/2018, 11:01 AM

## 2018-06-29 NOTE — Progress Notes (Signed)
RN walked into patient's room & nurse tech had assisted pt to bathroom.  Pt oxygen saturation in 70s while on 3L [place] & pulse reading 250 bpm on monitor.   RN increased oxygen to 5L [place] & oxygen increased to 90s.  Once back to bed, pt heart rate in 90s and oxygen 97% on 3L .  Pt having increased tremors, PRN Ativan given.  MD notified of desat and heart rate.  Pt and staff told not to get patient up to bathroom.  No new orders from MD at this time.

## 2018-06-30 ENCOUNTER — Encounter (HOSPITAL_COMMUNITY): Payer: Self-pay | Admitting: *Deleted

## 2018-06-30 ENCOUNTER — Ambulatory Visit: Payer: Medicaid Other

## 2018-06-30 DIAGNOSIS — E099 Drug or chemical induced diabetes mellitus without complications: Secondary | ICD-10-CM

## 2018-06-30 DIAGNOSIS — T380X5A Adverse effect of glucocorticoids and synthetic analogues, initial encounter: Secondary | ICD-10-CM

## 2018-06-30 LAB — GLUCOSE, CAPILLARY
Glucose-Capillary: 196 mg/dL — ABNORMAL HIGH (ref 70–99)
Glucose-Capillary: 149 mg/dL — ABNORMAL HIGH (ref 70–99)
Glucose-Capillary: 108 mg/dL — ABNORMAL HIGH (ref 70–99)

## 2018-06-30 MED ORDER — LEVOFLOXACIN 750 MG PO TABS
750.0000 mg | ORAL_TABLET | Freq: Every day | ORAL | Status: DC
  Administered 2018-07-01 – 2018-07-03 (×3): 750 mg via ORAL
  Filled 2018-06-30 (×3): qty 1

## 2018-06-30 NOTE — Plan of Care (Signed)
  Problem: Clinical Measurements: Goal: Respiratory complications will improve Outcome: Progressing   Problem: Clinical Measurements: Goal: Cardiovascular complication will be avoided Outcome: Progressing   Problem: Elimination: Goal: Will not experience complications related to bowel motility Outcome: Progressing   Problem: Safety: Goal: Ability to remain free from injury will improve Outcome: Progressing   

## 2018-06-30 NOTE — Progress Notes (Signed)
Progress Note    Taylor [name]  ALP:[id number] DOB: July 25, 1955  DOA: 06/27/2018 PCP: [name], NP    Brief Narrative:    Medical records reviewed and are as summarized below:  Taylor [name] is an 63 y.o. male with medical history significant of advanced COPD and asthma, bipolar disorder, continued tobacco abuser, diabetes, hypertension history of colon cancer, history of prostate cancer, recurrent hospitalization on chronic home oxygen who presented to the ER with progressive shortness of breath cough and wheezing.  Symptoms have been progressive in the last few days but worse today.   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  diabetes:  -will be worsened by steroid use -Sliding scale insulin with home regimen.   alcohol and tobacco abuse  - nicotine patch -CIWA   Family Communication/Anticipated D/C date and plan/Code Status   DVT prophylaxis: Lovenox ordered. Code Status: DNR Family Communication: patient Disposition Plan: 1-2 more days in the hospital with IV steroids   Medical Consultants:    Care management: patient can afford medications but [facility] to [facility]    Subjective:   Continues to c/o anxiety  Objective:    Vitals:   06/30/18 0746 06/30/18 0814 06/30/18 0815 06/30/18 0816   BP: 132/81     Pulse: 85     Resp: 18     Temp: 98.2 F (36.8 C)     TempSrc: Oral     SpO2: 99% 97% 97% 98%  Weight:      Height:        Intake/Output Summary (Last 24 hours) at 06/30/2018 0929 Last data filed at 06/30/2018 0333 Gross per 24 hour  Intake -  Output 1850 ml  Net -1850 ml   Filed Weights   06/28/18 0316  Weight: 67.7 kg    Exam: In bed, on O2 rrr Diminished breath sounds, mild increased in breathing Tremor in hands R>L  Data Reviewed:   I have personally reviewed following labs and imaging studies:  Labs: Labs show the following:   Basic Metabolic Panel: Recent Labs  Lab 06/27/18 2242 06/28/18 0654  NA 133* 135  K 4.2 4.2  CL 83* 86*  CO2 40* 39*  GLUCOSE 124* 258*  BUN 10 12  CREATININE 0.60* 0.62  CALCIUM 8.6* 8.5*  MG  --  2.4   GFR Estimated Creatinine Clearance: 90.5 mL/min (by C-G formula based on SCr of 0.62 mg/dL). Liver Function Tests: Recent Labs  Lab 06/28/18 0654  AST 19  ALT 27  ALKPHOS 55  BILITOT 0.6  PROT 5.5*  ALBUMIN 3.5   No results for input(s): LIPASE, AMYLASE in the last 168 hours. No results for input(s): AMMONIA in the last 168 hours. Coagulation profile No results for input(s): INR, PROTIME in the last 168 hours.  CBC: Recent Labs  Lab 06/27/18 2242 06/28/18 0654  WBC 7.3 4.1  NEUTROABS 6.0  --   HGB 12.6* 12.7*  HCT 39.6 39.3  MCV 97.8 96.1  PLT 242 213   Cardiac Enzymes: Recent Labs  Lab 06/27/18 2242  TROPONINI <0.03   BNP (last 3 results) No results for input(s): PROBNP in the last 8760 hours. CBG: Recent Labs  Lab 06/29/18 0729 06/29/18 1247 06/29/18 1625 06/29/18 2108 06/30/18 0714  GLUCAP 222* 152* 135* 206* 196*   D-Dimer: No results for input(s): DDIMER in the last 72 hours. Hgb A1c: No results for input(s): HGBA1C in the last 72 hours. Lipid Profile: No results for input(s): CHOL, HDL, LDLCALC, TRIG, CHOLHDL, LDLDIRECT in the last 72 hours. Thyroid function studies:  No results for input(s): TSH, T4TOTAL, T3FREE, THYROIDAB in the last 72 hours.  Invalid input(s): FREET3 Anemia work up: No results for input(s): VITAMINB12, FOLATE, FERRITIN, TIBC, IRON, RETICCTPCT in the last 72 hours. Sepsis Labs: Recent Labs  Lab 06/27/18 2242 06/28/18 0654  WBC 7.3 4.1    Microbiology Recent Results (from the past 240 hour(s))  SARS Coronavirus 2 (CEPHEID- Performed in [place] hospital lab), Hosp Order     Status: None   Collection Time: 06/27/18 10:42 PM  Result Value Ref Range Status   SARS Coronavirus 2 NEGATIVE NEGATIVE Final    Comment: (NOTE) If result is NEGATIVE SARS-CoV-2 target nucleic acids are NOT DETECTED. The SARS-CoV-2 RNA is generally detectable in upper and lower  respiratory specimens during the acute phase of infection. The lowest  concentration of SARS-CoV-2 viral copies this assay can detect is 250  copies / mL. A negative result does not preclude SARS-CoV-2 infection  and should not be used as the sole basis for treatment or other  patient management decisions.  A negative result may occur with  improper specimen collection / handling, submission of specimen other  than nasopharyngeal swab, presence of viral mutation(s) within the  areas targeted by this assay, and inadequate number of viral copies  (<250 copies / mL). A negative result must be combined with clinical  observations, patient history, and epidemiological information. If result is POSITIVE SARS-CoV-2 target nucleic acids are DETECTED. The SARS-CoV-2 RNA is generally detectable in upper and lower  respiratory specimens dur ing the acute phase of infection.  Positive  results are indicative of active infection with SARS-CoV-2.  Clinical  correlation with patient history and other diagnostic information is  necessary to determine patient infection status.  Positive results do  not rule out bacterial infection or co-infection with other viruses. If result is  PRESUMPTIVE POSTIVE SARS-CoV-2 nucleic acids MAY BE PRESENT.   A presumptive positive result was obtained on the submitted specimen  and confirmed on repeat testing.  While 2019 novel coronavirus  (SARS-CoV-2) nucleic acids may be present in the submitted sample  additional confirmatory testing may be necessary for epidemiological  and / or clinical management purposes  to differentiate between  SARS-CoV-2 and other Sarbecovirus currently known to infect humans.  If clinically indicated additional testing with an alternate test  methodology [phone]) is advised. The SARS-CoV-2 RNA is generally  detectable in upper and lower respiratory sp ecimens during the acute  phase of infection. The expected result is Negative. Fact Sheet for Patients:  StrictlyIdeas.no Fact Sheet for Healthcare Providers: BankingDealers.co.za This test is not yet approved or cleared by the [place] FDA and has been authorized for detection and/or diagnosis of SARS-CoV-2 by FDA under an Emergency Use Authorization (  EUA).  This EUA will remain in effect (meaning this test can be used) for the duration of the COVID-19 declaration under Section 564(b)(1) of the Act, 21 U.S.C. section 360bbb-3(b)(1), unless the authorization is terminated or revoked sooner. Performed at [facility], [place] [address], [place], [place] [address]     Procedures and diagnostic studies:  No results found.  Medications:   . ARIPiprazole  5 mg Oral Daily  . carvedilol  3.125 mg Oral BID WC  . enoxaparin (LOVENOX) injection  40 mg Subcutaneous Q24H  . escitalopram  20 mg Oral Daily  . folic acid  1 mg Oral Daily  . insulin aspart  0-5 Units Subcutaneous QHS  . insulin aspart  0-9 Units Subcutaneous TID WC  . ipratropium-albuterol  3 mL Nebulization BID  . [START ON 07/01/2018] levofloxacin  750 mg Oral Daily  . metFORMIN  1,000 mg Oral BID WC  . methylPREDNISolone  (SOLU-MEDROL) injection  40 mg Intravenous Q6H  . mometasone-formoterol  2 puff Inhalation BID  . multivitamin with minerals  1 tablet Oral Daily  . nicotine  14 mg Transdermal Daily  . pravastatin  20 mg Oral q1800  . tamsulosin  0.4 mg Oral Daily  . thiamine  100 mg Oral Daily  . umeclidinium bromide  1 puff Inhalation Daily   Continuous Infusions:    LOS: 1 day   [place] [facility]   How to contact the [facility] Attending or Consulting provider [place] or covering provider during after hours [place], for this patient?  1. Check the care team in [facility] and look for a) attending/consulting TRH provider listed and b) the [facility] team listed 2. Log into [email or web address] and use [place]'s universal password to access. If you do not have the password, please contact the hospital operator. 3. Locate the [facility] provider you are looking for under [facility] and page to a number that you can be directly reached. 4. If you still have difficulty reaching the provider, please page the [facility] (Director on Call) for the Hospitalists listed on amion for assistance.  06/30/2018, 9:29 AM
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
## 2018-07-01 ENCOUNTER — Ambulatory Visit: Payer: Medicaid Other

## 2018-07-01 ENCOUNTER — Telehealth: Payer: Self-pay | Admitting: Radiation Oncology

## 2018-07-01 DIAGNOSIS — Z720 Tobacco use: Secondary | ICD-10-CM

## 2018-07-01 DIAGNOSIS — I10 Essential (primary) hypertension: Secondary | ICD-10-CM

## 2018-07-01 DIAGNOSIS — E118 Type 2 diabetes mellitus with unspecified complications: Secondary | ICD-10-CM

## 2018-07-01 DIAGNOSIS — E1165 Type 2 diabetes mellitus with hyperglycemia: Secondary | ICD-10-CM

## 2018-07-01 LAB — GLUCOSE, CAPILLARY
Glucose-Capillary: 139 mg/dL — ABNORMAL HIGH (ref 70–99)
Glucose-Capillary: 126 mg/dL — ABNORMAL HIGH (ref 70–99)
Glucose-Capillary: 211 mg/dL — ABNORMAL HIGH (ref 70–99)
Glucose-Capillary: 136 mg/dL — ABNORMAL HIGH (ref 70–99)
Glucose-Capillary: 105 mg/dL — ABNORMAL HIGH (ref 70–99)

## 2018-07-01 MED ORDER — METHYLPREDNISOLONE SODIUM SUCC 40 MG IJ SOLR
40.0000 mg | Freq: Every day | INTRAMUSCULAR | Status: DC
  Administered 2018-07-02 – 2018-07-03 (×2): 40 mg via INTRAVENOUS
  Filled 2018-07-01 (×2): qty 1

## 2018-07-01 MED ORDER — LORAZEPAM 2 MG/ML IJ SOLN: 2.0000 mg | INTRAMUSCULAR | Status: DC | PRN

## 2018-07-01 MED ORDER — LISINOPRIL 2.5 MG PO TABS
2.5000 mg | ORAL_TABLET | Freq: Every day | ORAL | Status: DC
  Administered 2018-07-01 – 2018-07-03 (×3): 2.5 mg via ORAL
  Filled 2018-07-01 (×3): qty 1

## 2018-07-01 MED ORDER — IPRATROPIUM-ALBUTEROL 0.5-2.5 (3) MG/3ML IN SOLN
3.0000 mL | Freq: Four times a day (QID) | RESPIRATORY_TRACT | Status: DC
  Administered 2018-07-01 – 2018-07-02 (×5): 3 mL via RESPIRATORY_TRACT
  Filled 2018-07-01 (×5): qty 3

## 2018-07-01 NOTE — Telephone Encounter (Signed)
Opened in error

## 2018-07-01 NOTE — Progress Notes (Signed)
PROGRESS NOTE    Taylor [name]  DUK:[id number] DOB: March 04, 1955 DOA: 06/27/2018 PCP: [name], NP   Brief Narrative:  63 y.o. WM PMHx EtOH abuse, advanced COPD, asthma, continued tobacco abuse on 3 L home O2,, bipolar disorder, diabetes type 2 uncontrolled with complication , HTN, Hx colon cancer, Hx  prostate cancer, recurrent hospitalization  Pesented to the ER with progressive shortness of breath cough and wheezing.  Symptoms have been progressive in the last few days but worse today.     Subjective: A/O x4, positive S OB, negative CP, negative abdominal pain.   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[place])   Bipolar 1 disorder (HCC)   Smoker   Alcohol abuse   COPD with acute exacerbation ([place])   Essential hypertension  Acute on chronic respiratory failure/COPD exacerbation -Patient states ready to stop smoking  - Decrease Solu-Medrol 40 mg daily - DuoNeb every 6 hours -Flutter valve -Complete 7-day course antibiotics -Obtain ambulatory SPO2 on 6/11  Bipolar disorder  Essential HTN - Coreg 3.125 mg twice daily - 6/10 start lisinopril 2.5 mg daily    Diabetes uncontrolled with complication -3/76 hemoglobin A1c= 7.7 - Metformin 1000 mg twice daily - Sensitive SSI   EtOH abuse/tobacco abuse -CIWA protocol    Tobacco abuse - Nicotine patch   DVT prophylaxis: Lovenox Code Status: DNR Family Communication: None Disposition Plan: Discharge next 48 to 72 hours   Consultants:  None  Procedures/Significant Events:  None   I have personally reviewed and interpreted all radiology studies and my findings are as above.  VENTILATOR SETTINGS: None   Cultures   Antimicrobials: Anti-infectives (From admission, onward)   Start     Stop   07/01/18 1000  levofloxacin (LEVAQUIN) tablet 750  mg         06/28/18 0300  levofloxacin (LEVAQUIN) IVPB 750 mg  Status:  Discontinued     06/30/18 0858       Devices    LINES / TUBES:      Continuous Infusions:   Objective: Vitals:   07/01/18 0746 07/01/18 0800 07/01/18 0801 07/01/18 0802  BP: (!) 147/83     Pulse: 69     Resp: 19     Temp: 98.4 F (36.9 C)     TempSrc:      SpO2: 99% 97% 97% 97%  Weight:      Height:        Intake/Output Summary (Last 24 hours) at 07/01/2018 0815 Last data filed at 07/01/2018 0436 Gross per 24 hour  Intake -  Output 1325 ml  Net -1325 ml   Filed Weights   06/28/18 0316  Weight: 67.7 kg    Examination:  General: A/O x4, positive acute on chronic respiratory distress Eyes: negative scleral hemorrhage, negative anisocoria, negative icterus ENT: Negative Runny nose, negative gingival bleeding, Neck:  Negative scars, masses, torticollis, lymphadenopathy, JVD Lungs: Diffuse decreased breath sounds, poor air movement all lung fields, positive expiratory wheezes, negative crackles Cardiovascular: Regular rate and rhythm without murmur gallop or rub normal S1 and S2 Abdomen: negative abdominal pain, nondistended, positive soft, bowel sounds, no rebound, no ascites, no appreciable mass Extremities: No significant cyanosis, clubbing, or edema bilateral lower extremities Skin: Negative rashes, lesions, ulcers Psychiatric:  Negative depression, negative anxiety, negative fatigue, negative mania  Central nervous system:  Cranial nerves II through  XII intact, tongue/uvula midline, all extremities muscle strength 5/5, sensation intact throughout,  negative dysarthria, negative expressive aphasia, negative receptive aphasia.  .     Data Reviewed: Care during the described time interval was provided by me .  I have reviewed this patient's available data, including medical history, events of note, physical examination, and all test results as part of my evaluation.   CBC: Recent Labs   Lab 06/27/18 2242 06/28/18 0654  WBC 7.3 4.1  NEUTROABS 6.0  --   HGB 12.6* 12.7*  HCT 39.6 39.3  MCV 97.8 96.1  PLT 242 202   Basic Metabolic Panel: Recent Labs  Lab 06/27/18 2242 06/28/18 0654  NA 133* 135  K 4.2 4.2  CL 83* 86*  CO2 40* 39*  GLUCOSE 124* 258*  BUN 10 12  CREATININE 0.60* 0.62  CALCIUM 8.6* 8.5*  MG  --  2.4   GFR: Estimated Creatinine Clearance: 90.5 mL/min (by C-G formula based on SCr of 0.62 mg/dL). Liver Function Tests: Recent Labs  Lab 06/28/18 0654  AST 19  ALT 27  ALKPHOS 55  BILITOT 0.6  PROT 5.5*  ALBUMIN 3.5   No results for input(s): LIPASE, AMYLASE in the last 168 hours. No results for input(s): AMMONIA in the last 168 hours. Coagulation Profile: No results for input(s): INR, PROTIME in the last 168 hours. Cardiac Enzymes: Recent Labs  Lab 06/27/18 2242  TROPONINI <0.03   BNP (last 3 results) No results for input(s): PROBNP in the last 8760 hours. HbA1C: No results for input(s): HGBA1C in the last 72 hours. CBG: Recent Labs  Lab 06/29/18 1625 06/29/18 2108 06/30/18 0714 06/30/18 1717 06/30/18 2105  GLUCAP 135* 206* 196* 149* 108*   Lipid Profile: No results for input(s): CHOL, HDL, LDLCALC, TRIG, CHOLHDL, LDLDIRECT in the last 72 hours. Thyroid Function Tests: No results for input(s): TSH, T4TOTAL, FREET4, T3FREE, THYROIDAB in the last 72 hours. Anemia Panel: No results for input(s): VITAMINB12, FOLATE, FERRITIN, TIBC, IRON, RETICCTPCT in the last 72 hours. Urine analysis:    Component Value Date/Time   COLORURINE STRAW (A) 05/15/2018 1600   APPEARANCEUR CLEAR 05/15/2018 1600   LABSPEC >1.046 (H) 05/15/2018 1600   PHURINE 6.0 05/15/2018 1600   GLUCOSEU NEGATIVE 05/15/2018 1600   HGBUR NEGATIVE 05/15/2018 1600   BILIRUBINUR NEGATIVE 05/15/2018 1600   KETONESUR NEGATIVE 05/15/2018 1600   PROTEINUR NEGATIVE 05/15/2018 1600   UROBILINOGEN 1.0 01/09/2013 1843   NITRITE NEGATIVE 05/15/2018 1600   LEUKOCYTESUR  NEGATIVE 05/15/2018 1600   Sepsis Labs: @LABRCNTIP (procalcitonin:4,lacticidven:4)  ) Recent Results (from the past 240 hour(s))  SARS Coronavirus 2 (CEPHEID- Performed in [facility] hospital lab), Hosp Order     Status: None   Collection Time: 06/27/18 10:42 PM  Result Value Ref Range Status   SARS Coronavirus 2 NEGATIVE NEGATIVE Final    Comment: (NOTE) If result is NEGATIVE SARS-CoV-2 target nucleic acids are NOT DETECTED. The SARS-CoV-2 RNA is generally detectable in upper and lower  respiratory specimens during the acute phase of infection. The lowest  concentration of SARS-CoV-2 viral copies this assay can detect is 250  copies / mL. A negative result does not preclude SARS-CoV-2 infection  and should not be used as the sole basis for treatment or other  patient management decisions.  A negative result may occur with  improper specimen collection / handling, submission of specimen other  than nasopharyngeal swab, presence of viral mutation(s) within the  areas targeted by this assay, and inadequate number of  viral copies  (<250 copies / mL). A negative result must be combined with clinical  observations, patient history, and epidemiological information. If result is POSITIVE SARS-CoV-2 target nucleic acids are DETECTED. The SARS-CoV-2 RNA is generally detectable in upper and lower  respiratory specimens dur ing the acute phase of infection.  Positive  results are indicative of active infection with SARS-CoV-2.  Clinical  correlation with patient history and other diagnostic information is  necessary to determine patient infection status.  Positive results do  not rule out bacterial infection or co-infection with other viruses. If result is PRESUMPTIVE POSTIVE SARS-CoV-2 nucleic acids MAY BE PRESENT.   A presumptive positive result was obtained on the submitted specimen  and confirmed on repeat testing.  While 2019 novel coronavirus  (SARS-CoV-2) nucleic acids may be  present in the submitted sample  additional confirmatory testing may be necessary for epidemiological  and / or clinical management purposes  to differentiate between  SARS-CoV-2 and other Sarbecovirus currently known to infect humans.  If clinically indicated additional testing with an alternate test  methodology [phone]) is advised. The SARS-CoV-2 RNA is generally  detectable in upper and lower respiratory sp ecimens during the acute  phase of infection. The expected result is Negative. Fact Sheet for Patients:  StrictlyIdeas.no Fact Sheet for Healthcare Providers: BankingDealers.co.za This test is not yet approved or cleared by the [place] FDA and has been authorized for detection and/or diagnosis of SARS-CoV-2 by FDA under an Emergency Use Authorization (EUA).  This EUA will remain in effect (meaning this test can be used) for the duration of the COVID-19 declaration under Section 564(b)(1) of the Act, 21 U.S.C. section 360bbb-3(b)(1), unless the authorization is terminated or revoked sooner. Performed at [facility], [place] [address], [place], [place] [address]          Radiology Studies: No results found.      Scheduled Meds: . ARIPiprazole  5 mg Oral Daily  . carvedilol  3.125 mg Oral BID WC  . enoxaparin (LOVENOX) injection  40 mg Subcutaneous Q24H  . escitalopram  20 mg Oral Daily  . folic acid  1 mg Oral Daily  . insulin aspart  0-5 Units Subcutaneous QHS  . insulin aspart  0-9 Units Subcutaneous TID WC  . ipratropium-albuterol  3 mL Nebulization BID  . levofloxacin  750 mg Oral Daily  . metFORMIN  1,000 mg Oral BID WC  . methylPREDNISolone (SOLU-MEDROL) injection  40 mg Intravenous Q6H  . mometasone-formoterol  2 puff Inhalation BID  . multivitamin with minerals  1 tablet Oral Daily  . nicotine  14 mg Transdermal Daily  . pravastatin  20 mg Oral q1800  . tamsulosin  0.4 mg Oral Daily  . thiamine   100 mg Oral Daily  . umeclidinium bromide  1 puff Inhalation Daily   Continuous Infusions:   LOS: 2 days   The patient is critically ill with multiple organ systems failure and requires high complexity decision making for assessment and support, frequent evaluation and titration of therapies, application of advanced monitoring technologies and extensive interpretation of multiple databases. Critical Care Time devoted to patient care services described in this note   Time spent: 30 minutes     WOODS, Geraldo Docker, MD [facility] Pager [phone]  If 7PM-7AM, please contact night-coverage [email or web address] Password [email or web address] 07/01/2018, 8:15 AM

## 2018-07-02 ENCOUNTER — Ambulatory Visit: Payer: Medicaid Other

## 2018-07-02 LAB — GLUCOSE, CAPILLARY
Glucose-Capillary: 168 mg/dL — ABNORMAL HIGH (ref 70–99)
Glucose-Capillary: 96 mg/dL (ref 70–99)
Glucose-Capillary: 142 mg/dL — ABNORMAL HIGH (ref 70–99)
Glucose-Capillary: 95 mg/dL (ref 70–99)

## 2018-07-02 MED ORDER — IPRATROPIUM-ALBUTEROL 0.5-2.5 (3) MG/3ML IN SOLN
3.0000 mL | Freq: Three times a day (TID) | RESPIRATORY_TRACT | Status: DC
  Administered 2018-07-03: 3 mL via RESPIRATORY_TRACT
  Filled 2018-07-02: qty 3

## 2018-07-02 MED ORDER — IPRATROPIUM-ALBUTEROL 0.5-2.5 (3) MG/3ML IN SOLN: 3.0000 mL | Freq: Three times a day (TID) | RESPIRATORY_TRACT | Status: DC

## 2018-07-02 NOTE — Plan of Care (Signed)
  Problem: Health Behavior/Discharge Planning: Goal: Ability to manage health-related needs will improve Outcome: Progressing   Problem: Clinical Measurements: Goal: Ability to maintain clinical measurements within normal limits will improve Outcome: Progressing   

## 2018-07-02 NOTE — Plan of Care (Signed)
  Problem: Education: Goal: Knowledge of General Education information will improve Description: Including pain rating scale, medication(s)/side effects and non-pharmacologic comfort measures Outcome: Progressing   Problem: Health Behavior/Discharge Planning: Goal: Ability to manage health-related needs will improve Outcome: Progressing   Problem: Clinical Measurements: Goal: Ability to maintain clinical measurements within normal limits will improve 07/02/2018 0338 by Fredirick Maudlin, RN Outcome: Progressing 07/02/2018 0057 by Fredirick Maudlin, RN Outcome: Progressing Goal: Will remain free from infection Outcome: Progressing Goal: Diagnostic test results will improve Outcome: Progressing

## 2018-07-02 NOTE — Progress Notes (Signed)
PROGRESS NOTE    Taylor [name]  CXK:[id number] DOB: [birth year] DOA: 06/27/2018 PCP: [name], NP   Brief Narrative:  63 y.o. WM PMHx EtOH abuse, advanced COPD, asthma, continued tobacco abuse on 3 L home O2,, bipolar disorder, diabetes type 2 uncontrolled with complication , HTN, Hx colon cancer, Hx  prostate cancer, recurrent hospitalization  Pesented to the ER with progressive shortness of breath cough and wheezing.  Symptoms have been progressive in the last few days but worse today.     Subjective: 6/11 A/O x4, positive S OB, negative CP, negative abdominal pain.     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[place])   Bipolar 1 disorder (HCC)   Smoker   Alcohol abuse   COPD with acute exacerbation ([place])   Essential hypertension  Acute on chronic respiratory failure/COPD exacerbation -Patient states ready to stop smoking  - Decrease Solu-Medrol 40 mg daily - DuoNeb every 6 hours -Flutter valve -Complete 7-day course antibiotics -Obtain ambulatory SPO2 on 6/12  Bipolar disorder  Essential HTN - Coreg 3.125 mg twice daily - 6/10 start lisinopril 2.5 mg daily    Diabetes uncontrolled with complication -9/70 hemoglobin A1c= 7.7 - Metformin 1000 mg twice daily - Sensitive SSI   EtOH abuse/tobacco abuse -CIWA protocol    Tobacco abuse - Nicotine patch   DVT prophylaxis: Lovenox Code Status: DNR Family Communication: None Disposition Plan: Discharge next 48 to 72 hours   Consultants:  None  Procedures/Significant Events:  None   I have personally reviewed and interpreted all radiology studies and my findings are as above.  VENTILATOR SETTINGS: None   Cultures   Antimicrobials: Anti-infectives (From admission, onward)   Start     Stop   07/01/18 1000  levofloxacin (LEVAQUIN)  tablet 750 mg         06/28/18 0300  levofloxacin (LEVAQUIN) IVPB 750 mg  Status:  Discontinued     06/30/18 0858       Devices    LINES / TUBES:      Continuous Infusions:   Objective: Vitals:   07/02/18 1004 07/02/18 1414 07/02/18 1624 07/02/18 1754  BP:   117/69   Pulse: 80 90 92 (!) 102  Resp: 18 20 16 18   Temp:   98.6 F (37 C)   TempSrc:   Oral   SpO2: 98% 98% 97% 95%  Weight:      Height:        Intake/Output Summary (Last 24 hours) at 07/02/2018 1906 Last data filed at 07/02/2018 1747 Gross per 24 hour  Intake 480 ml  Output 1515 ml  Net -1035 ml   Filed Weights   06/28/18 0316  Weight: 67.7 kg   Physical Exam:  General: A/O x4, positive acute on chronic respiratory distress Eyes: negative scleral hemorrhage, negative anisocoria, negative icterus ENT: Negative Runny nose, negative gingival bleeding, Neck:  Negative scars, masses, torticollis, lymphadenopathy, JVD Lungs: Diffuse expiratory wheezes without crackles Cardiovascular: Regular rate and rhythm without murmur gallop or rub normal S1 and S2 Abdomen: negative abdominal pain, nondistended, positive soft, bowel sounds, no rebound, no ascites, no appreciable mass Extremities: No significant cyanosis, clubbing, or edema bilateral lower extremities Skin: Negative rashes, lesions, ulcers Psychiatric:  Negative depression, negative anxiety, negative fatigue, negative mania  Central nervous system:  Cranial nerves II through XII intact, tongue/uvula midline, all extremities  muscle strength 5/5, sensation intact throughout, negative dysarthria, negative expressive aphasia, negative receptive aphasia. .     Data Reviewed: Care during the described time interval was provided by me .  I have reviewed this patient's available data, including medical history, events of note, physical examination, and all test results as part of my evaluation.   CBC: Recent Labs  Lab 06/27/18 2242 06/28/18 0654  WBC  7.3 4.1  NEUTROABS 6.0  --   HGB 12.6* 12.7*  HCT 39.6 39.3  MCV 97.8 96.1  PLT 242 741   Basic Metabolic Panel: Recent Labs  Lab 06/27/18 2242 06/28/18 0654  NA 133* 135  K 4.2 4.2  CL 83* 86*  CO2 40* 39*  GLUCOSE 124* 258*  BUN 10 12  CREATININE 0.60* 0.62  CALCIUM 8.6* 8.5*  MG  --  2.4   GFR: Estimated Creatinine Clearance: 90.5 mL/min (by C-G formula based on SCr of 0.62 mg/dL). Liver Function Tests: Recent Labs  Lab 06/28/18 0654  AST 19  ALT 27  ALKPHOS 55  BILITOT 0.6  PROT 5.5*  ALBUMIN 3.5   No results for input(s): LIPASE, AMYLASE in the last 168 hours. No results for input(s): AMMONIA in the last 168 hours. Coagulation Profile: No results for input(s): INR, PROTIME in the last 168 hours. Cardiac Enzymes: Recent Labs  Lab 06/27/18 2242  TROPONINI <0.03   BNP (last 3 results) No results for input(s): PROBNP in the last 8760 hours. HbA1C: No results for input(s): HGBA1C in the last 72 hours. CBG: Recent Labs  Lab 07/01/18 1704 07/01/18 2130 07/02/18 0820 07/02/18 1209 07/02/18 1625  GLUCAP 136* 105* 168* 96 142*   Lipid Profile: No results for input(s): CHOL, HDL, LDLCALC, TRIG, CHOLHDL, LDLDIRECT in the last 72 hours. Thyroid Function Tests: No results for input(s): TSH, T4TOTAL, FREET4, T3FREE, THYROIDAB in the last 72 hours. Anemia Panel: No results for input(s): VITAMINB12, FOLATE, FERRITIN, TIBC, IRON, RETICCTPCT in the last 72 hours. Urine analysis:    Component Value Date/Time   COLORURINE STRAW (A) 05/15/2018 1600   APPEARANCEUR CLEAR 05/15/2018 1600   LABSPEC >1.046 (H) 05/15/2018 1600   PHURINE 6.0 05/15/2018 1600   GLUCOSEU NEGATIVE 05/15/2018 1600   HGBUR NEGATIVE 05/15/2018 1600   BILIRUBINUR NEGATIVE 05/15/2018 1600   KETONESUR NEGATIVE 05/15/2018 1600   PROTEINUR NEGATIVE 05/15/2018 1600   UROBILINOGEN 1.0 01/09/2013 1843   NITRITE NEGATIVE 05/15/2018 1600   LEUKOCYTESUR NEGATIVE 05/15/2018 1600   Sepsis Labs:  @[email or web address] (procalcitonin:4,lacticidven:4)  ) Recent Results (from the past 240 hour(s))  SARS Coronavirus 2 (CEPHEID- Performed in [place] hospital lab), Hosp Order     Status: None   Collection Time: 06/27/18 10:42 PM   Specimen: Nasopharyngeal Swab  Result Value Ref Range Status   SARS Coronavirus 2 NEGATIVE NEGATIVE Final    Comment: (NOTE) If result is NEGATIVE SARS-CoV-2 target nucleic acids are NOT DETECTED. The SARS-CoV-2 RNA is generally detectable in upper and lower  respiratory specimens during the acute phase of infection. The lowest  concentration of SARS-CoV-2 viral copies this assay can detect is 250  copies / mL. A negative result does not preclude SARS-CoV-2 infection  and should not be used as the sole basis for treatment or other  patient management decisions.  A negative result may occur with  improper specimen collection / handling, submission of specimen other  than nasopharyngeal swab, presence of viral mutation(s) within the  areas targeted by this assay, and inadequate number of viral copies  (<  250 copies / mL). A negative result must be combined with clinical  observations, patient history, and epidemiological information. If result is POSITIVE SARS-CoV-2 target nucleic acids are DETECTED. The SARS-CoV-2 RNA is generally detectable in upper and lower  respiratory specimens dur ing the acute phase of infection.  Positive  results are indicative of active infection with SARS-CoV-2.  Clinical  correlation with patient history and other diagnostic information is  necessary to determine patient infection status.  Positive results do  not rule out bacterial infection or co-infection with other viruses. If result is PRESUMPTIVE POSTIVE SARS-CoV-2 nucleic acids MAY BE PRESENT.   A presumptive positive result was obtained on the submitted specimen  and confirmed on repeat testing.  While 2019 novel coronavirus  (SARS-CoV-2) nucleic acids may be present in the  submitted sample  additional confirmatory testing may be necessary for epidemiological  and / or clinical management purposes  to differentiate between  SARS-CoV-2 and other Sarbecovirus currently known to infect humans.  If clinically indicated additional testing with an alternate test  methodology [phone]) is advised. The SARS-CoV-2 RNA is generally  detectable in upper and lower respiratory sp ecimens during the acute  phase of infection. The expected result is Negative. Fact Sheet for Patients:  StrictlyIdeas.no Fact Sheet for Healthcare Providers: BankingDealers.co.za This test is not yet approved or cleared by the [place] FDA and has been authorized for detection and/or diagnosis of SARS-CoV-2 by FDA under an Emergency Use Authorization (EUA).  This EUA will remain in effect (meaning this test can be used) for the duration of the COVID-19 declaration under Section 564(b)(1) of the Act, 21 U.S.C. section 360bbb-3(b)(1), unless the authorization is terminated or revoked sooner. Performed at [facility], [place] [address] [place], [place] [address]          Radiology Studies: No results found.      Scheduled Meds: . ARIPiprazole  5 mg Oral Daily  . carvedilol  3.125 mg Oral BID WC  . enoxaparin (LOVENOX) injection  40 mg Subcutaneous Q24H  . escitalopram  20 mg Oral Daily  . folic acid  1 mg Oral Daily  . insulin aspart  0-5 Units Subcutaneous QHS  . insulin aspart  0-9 Units Subcutaneous TID WC  . ipratropium-albuterol  3 mL Nebulization Q6H  . levofloxacin  750 mg Oral Daily  . lisinopril  2.5 mg Oral Daily  . metFORMIN  1,000 mg Oral BID WC  . methylPREDNISolone (SOLU-MEDROL) injection  40 mg Intravenous Daily  . mometasone-formoterol  2 puff Inhalation BID  . multivitamin with minerals  1 tablet Oral Daily  . nicotine  14 mg Transdermal Daily  . pravastatin  20 mg Oral q1800  . tamsulosin  0.4 mg  Oral Daily  . thiamine  100 mg Oral Daily   Continuous Infusions:   LOS: 3 days   The patient is critically ill with multiple organ systems failure and requires high complexity decision making for assessment and support, frequent evaluation and titration of therapies, application of advanced monitoring technologies and extensive interpretation of multiple databases. Critical Care Time devoted to patient care services described in this note   Time spent: 30 minutes     WOODS, Geraldo Docker, MD [facility] Pager [phone]  If 7PM-7AM, please contact night-coverage [email or web address] Password [facility] 07/02/2018, 7:06 PM

## 2018-07-02 NOTE — Plan of Care (Signed)
  Problem: Education: Goal: Knowledge of General Education information will improve Description: Including pain rating scale, medication(s)/side effects and non-pharmacologic comfort measures 07/02/2018 0338 by Fredirick Maudlin, RN Outcome: Progressing 07/02/2018 0338 by Fredirick Maudlin, RN Outcome: Progressing   Problem: Health Behavior/Discharge Planning: Goal: Ability to manage health-related needs will improve Outcome: Progressing   Problem: Clinical Measurements: Goal: Ability to maintain clinical measurements within normal limits will improve 07/02/2018 0338 by Fredirick Maudlin, RN Outcome: Progressing 07/02/2018 0338 by Fredirick Maudlin, RN Outcome: Progressing 07/02/2018 0057 by Fredirick Maudlin, RN Outcome: Progressing Goal: Will remain free from infection 07/02/2018 0338 by Fredirick Maudlin, RN Outcome: Progressing 07/02/2018 0338 by Fredirick Maudlin, RN Outcome: Progressing Goal: Diagnostic test results will improve 07/02/2018 0338 by Fredirick Maudlin, RN Outcome: Progressing 07/02/2018 0338 by Fredirick Maudlin, RN Outcome: Progressing

## 2018-07-02 NOTE — Plan of Care (Signed)
  Problem: Clinical Measurements: Goal: Ability to maintain clinical measurements within normal limits will improve Outcome: Progressing Goal: Respiratory complications will improve Outcome: Progressing   

## 2018-07-02 NOTE — Plan of Care (Addendum)
Encouraged to use Incentive Spirometer at bedside. Able to follow instructions. Flutter valve delivered by respiratory tech and patient breathing out 10 times and instructed to use it every 4 hours while awake. Patient receptive to instructions.

## 2018-07-03 ENCOUNTER — Ambulatory Visit: Payer: Medicaid Other

## 2018-07-03 LAB — GLUCOSE, CAPILLARY
Glucose-Capillary: 80 mg/dL (ref 70–99)
Glucose-Capillary: 118 mg/dL — ABNORMAL HIGH (ref 70–99)

## 2018-07-03 MED ORDER — LISINOPRIL 2.5 MG PO TABS: 2.5000 mg | ORAL_TABLET | Freq: Every day | ORAL | 0 refills | Status: DC

## 2018-07-03 MED ORDER — ALBUTEROL SULFATE (2.5 MG/3ML) 0.083% IN NEBU: 2.5000 mg | INHALATION_SOLUTION | RESPIRATORY_TRACT | Status: DC | PRN

## 2018-07-03 MED ORDER — THIAMINE HCL 100 MG PO TABS: 100.0000 mg | ORAL_TABLET | Freq: Every day | ORAL | 0 refills | Status: DC

## 2018-07-03 MED ORDER — FOLIC ACID 1 MG PO TABS: 1.0000 mg | ORAL_TABLET | Freq: Every day | ORAL | 0 refills | Status: DC

## 2018-07-03 MED ORDER — IPRATROPIUM-ALBUTEROL 0.5-2.5 (3) MG/3ML IN SOLN: 3.0000 mL | Freq: Two times a day (BID) | RESPIRATORY_TRACT | Status: DC

## 2018-07-03 MED ORDER — LEVOFLOXACIN 750 MG PO TABS: 750.0000 mg | ORAL_TABLET | Freq: Every day | ORAL | 0 refills | Status: DC

## 2018-07-03 MED FILL — THIAMINE HCL 100 MG TABS: 100 | 30 days supply | Qty: 30 | Fill #0

## 2018-07-03 MED FILL — FOLIC ACID 1 MG TABS: 1 | 30 days supply | Qty: 30 | Fill #0

## 2018-07-03 MED FILL — levoFLOXacin 750 MG TABS: 750 | 5 days supply | Qty: 5 | Fill #0

## 2018-07-03 MED FILL — LISINOPRIL 2.5 MG TABLET: 2.5 | 30 days supply | Qty: 30 | Fill #0

## 2018-07-03 NOTE — Progress Notes (Signed)
Pt ambulating eval with baseline 3L [place].   Oxygen 97% before ambulating.  96% while walking.

## 2018-07-03 NOTE — Progress Notes (Signed)
MD notified of pt 14 burst of Atrial Tach with heart rate up to 155.  VSS and pt asymptomatic.   MD also notified of new significant bruising on pt's lower right abdomen.
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
## 2018-07-03 NOTE — TOC Transition Note (Signed)
Transition of Care [facility]) - CM/SW Discharge Note   Patient Details  Name: Taylor [name] MRN: [id number] Date of Birth: 1955/02/25  [facility]) CM/SW Contact:  [name], RN Phone Number: 07/03/2018, 3:02 PM   Clinical Narrative:    From home with friends, pta indep, he has home oxygen with [employer].  He states he will need ambulance transport at [facility], address confirmed. His follow up apt with [facility] is on 6/17 at 2:30. He states he does not need a pharmacy that delivers meds, he is ok to go his medications.  NCM will give him a list in  Case he wants to use this service.   6/12 He is for discharge today, he will need ambulance transport.  NCM checked with RN to see what will be a good time for her . Transport set for 3 pm.  Ambulance forms on chart. MD notified to come sign the DNR form.    Final next level of care: Home/Self Care Barriers to Discharge: No Barriers Identified   Patient Goals and CMS Choice Patient states their goals for this hospitalization and ongoing recovery are:: get better   Choice offered to / list presented to : NA  Discharge Placement                       Discharge Plan and Services In-house Referral: NA Discharge Planning Services: CM Consult Post Acute Care Choice: NA          DME Arranged: (NA) DME Agency: NA       HH Arranged: NA HH Agency: (NA)        Social Determinants of Health (SDOH) Interventions     Readmission Risk Interventions Readmission Risk Prevention Plan 06/30/2018 05/27/2018 05/03/2018  Transportation Screening Complete Complete -  Medication Review Press photographer) Complete Complete -  PCP or Specialist appointment within 3-5 days of discharge Complete Complete -  [place] or Home Care Consult Complete - Patient refused  SW Recovery Care/Counseling Consult Complete Not Complete -  SW Consult Not Complete Comments - - -  Palliative Care Screening Not Applicable Not  Applicable -  [place] Not Applicable Not Applicable -  Some recent data might be hidden

## 2018-07-05 LAB — PULMONARY FUNCTION TEST
DL/VA % pred: 68 %
DL/VA: 3.19 ml/min/mmHg/L
DLCO unc % pred: 44 %
DLCO unc: 14.27 ml/min/mmHg
FEF 25-75 Post: 0.42 L/sec
FEF 25-75 Pre: 0.35 L/sec
FEF2575-%Change-Post: 19 %
FEF2575-%Pred-Post: 13 %
FEF2575-%Pred-Pre: 11 %
FEV1-%Change-Post: 8 %
FEV1-%Pred-Post: 26 %
FEV1-%Pred-Pre: 24 %
FEV1-Post: 0.97 L
FEV1-Pre: 0.89 L
FEV1FVC-%Change-Post: 4 %
FEV1FVC-%PRED-PRE: 49 %
FEV6-%CHANGE-POST: 6 %
FEV6-%Pred-Post: 49 %
FEV6-%Pred-Pre: 46 %
FEV6-Post: 2.3 L
FEV6-Pre: 2.15 L
FEV6FVC-%CHANGE-POST: 2 %
FEV6FVC-%Pred-Post: 97 %
FEV6FVC-%Pred-Pre: 94 %
FVC-%Change-Post: 4 %
FVC-%Pred-Post: 51 %
FVC-%Pred-Pre: 48 %
FVC-Post: 2.46 L
PRE FEV1/FVC RATIO: 38 %
Post FEV1/FVC ratio: 39 %
Post FEV6/FVC ratio: 93 %
Pre FEV6/FVC Ratio: 91 %

## 2018-07-06 ENCOUNTER — Ambulatory Visit: Payer: Medicaid Other

## 2018-07-07 ENCOUNTER — Telehealth: Payer: Self-pay | Admitting: Radiation Oncology

## 2018-07-07 ENCOUNTER — Ambulatory Visit: Payer: Medicaid Other

## 2018-07-07 NOTE — Telephone Encounter (Signed)
Patient has been discharged from the hospital. Phoned patient to inquire about plans to resume radiation therapy. No answer. Unable to leave a voicemail message since mailbox hasn't been set up yet. Will attempt to reach patient at a later time.

## 2018-07-08 ENCOUNTER — Inpatient Hospital Stay (INDEPENDENT_AMBULATORY_CARE_PROVIDER_SITE_OTHER): Payer: No Typology Code available for payment source | Admitting: Primary Care

## 2018-07-08 ENCOUNTER — Ambulatory Visit: Payer: Medicaid Other

## 2018-07-09 ENCOUNTER — Ambulatory Visit: Payer: Medicaid Other

## 2018-07-10 ENCOUNTER — Ambulatory Visit: Payer: Medicaid Other

## 2018-07-13 ENCOUNTER — Ambulatory Visit: Payer: Medicaid Other

## 2018-07-13 ENCOUNTER — Telehealth: Payer: Self-pay | Admitting: Radiation Oncology

## 2018-07-13 NOTE — Telephone Encounter (Signed)
Opened in error

## 2018-07-14 ENCOUNTER — Ambulatory Visit: Payer: Medicaid Other

## 2018-07-15 ENCOUNTER — Ambulatory Visit: Payer: Medicaid Other

## 2018-07-16 ENCOUNTER — Ambulatory Visit: Payer: Medicaid Other

## 2018-07-17 ENCOUNTER — Ambulatory Visit: Payer: Medicaid Other

## 2018-07-20 ENCOUNTER — Emergency Department (HOSPITAL_COMMUNITY): Payer: Medicaid Other

## 2018-07-20 ENCOUNTER — Inpatient Hospital Stay (HOSPITAL_COMMUNITY)
Admission: EM | Admit: 2018-07-20 | Discharge: 2018-07-24 | DRG: 190 | Disposition: A | Discharge status: Home / Self Care | Payer: Medicaid Other | Attending: Internal Medicine | Admitting: Internal Medicine

## 2018-07-20 ENCOUNTER — Ambulatory Visit: Payer: Medicaid Other

## 2018-07-20 ENCOUNTER — Encounter (HOSPITAL_COMMUNITY): Payer: Self-pay | Admitting: Emergency Medicine

## 2018-07-20 DIAGNOSIS — Z7951 Long term (current) use of inhaled steroids: Secondary | ICD-10-CM

## 2018-07-20 DIAGNOSIS — R0602 Shortness of breath: Secondary | ICD-10-CM | POA: Diagnosis not present

## 2018-07-20 DIAGNOSIS — J439 Emphysema, unspecified: Principal | ICD-10-CM | POA: Diagnosis present

## 2018-07-20 DIAGNOSIS — E785 Hyperlipidemia, unspecified: Secondary | ICD-10-CM | POA: Diagnosis present

## 2018-07-20 DIAGNOSIS — Z7984 Long term (current) use of oral hypoglycemic drugs: Secondary | ICD-10-CM

## 2018-07-20 DIAGNOSIS — Z85038 Personal history of other malignant neoplasm of large intestine: Secondary | ICD-10-CM

## 2018-07-20 DIAGNOSIS — F319 Bipolar disorder, unspecified: Secondary | ICD-10-CM | POA: Diagnosis present

## 2018-07-20 DIAGNOSIS — R0600 Dyspnea, unspecified: Secondary | ICD-10-CM | POA: Diagnosis present

## 2018-07-20 DIAGNOSIS — E1165 Type 2 diabetes mellitus with hyperglycemia: Secondary | ICD-10-CM | POA: Diagnosis present

## 2018-07-20 DIAGNOSIS — Z79899 Other long term (current) drug therapy: Secondary | ICD-10-CM

## 2018-07-20 DIAGNOSIS — I10 Essential (primary) hypertension: Secondary | ICD-10-CM | POA: Diagnosis present

## 2018-07-20 DIAGNOSIS — J441 Chronic obstructive pulmonary disease with (acute) exacerbation: Secondary | ICD-10-CM

## 2018-07-20 DIAGNOSIS — Z8546 Personal history of malignant neoplasm of prostate: Secondary | ICD-10-CM

## 2018-07-20 DIAGNOSIS — J449 Chronic obstructive pulmonary disease, unspecified: Secondary | ICD-10-CM | POA: Diagnosis present

## 2018-07-20 DIAGNOSIS — J9621 Acute and chronic respiratory failure with hypoxia: Secondary | ICD-10-CM | POA: Diagnosis not present

## 2018-07-20 DIAGNOSIS — E119 Type 2 diabetes mellitus without complications: Secondary | ICD-10-CM

## 2018-07-20 DIAGNOSIS — Z515 Encounter for palliative care: Secondary | ICD-10-CM | POA: Diagnosis present

## 2018-07-20 DIAGNOSIS — F329 Major depressive disorder, single episode, unspecified: Secondary | ICD-10-CM | POA: Diagnosis present

## 2018-07-20 DIAGNOSIS — Z1159 Encounter for screening for other viral diseases: Secondary | ICD-10-CM

## 2018-07-20 DIAGNOSIS — Z7952 Long term (current) use of systemic steroids: Secondary | ICD-10-CM

## 2018-07-20 DIAGNOSIS — F419 Anxiety disorder, unspecified: Secondary | ICD-10-CM | POA: Diagnosis present

## 2018-07-20 DIAGNOSIS — C189 Malignant neoplasm of colon, unspecified: Secondary | ICD-10-CM | POA: Diagnosis present

## 2018-07-20 DIAGNOSIS — C61 Malignant neoplasm of prostate: Secondary | ICD-10-CM | POA: Diagnosis present

## 2018-07-20 DIAGNOSIS — Z7189 Other specified counseling: Secondary | ICD-10-CM

## 2018-07-20 DIAGNOSIS — Z9114 Patient's other noncompliance with medication regimen: Secondary | ICD-10-CM

## 2018-07-20 DIAGNOSIS — F17210 Nicotine dependence, cigarettes, uncomplicated: Secondary | ICD-10-CM | POA: Diagnosis present

## 2018-07-20 DIAGNOSIS — Z8249 Family history of ischemic heart disease and other diseases of the circulatory system: Secondary | ICD-10-CM

## 2018-07-20 DIAGNOSIS — Z933 Colostomy status: Secondary | ICD-10-CM

## 2018-07-20 DIAGNOSIS — J9622 Acute and chronic respiratory failure with hypercapnia: Secondary | ICD-10-CM | POA: Diagnosis present

## 2018-07-20 DIAGNOSIS — Z66 Do not resuscitate: Secondary | ICD-10-CM | POA: Diagnosis present

## 2018-07-20 DIAGNOSIS — Z801 Family history of malignant neoplasm of trachea, bronchus and lung: Secondary | ICD-10-CM

## 2018-07-20 DIAGNOSIS — Z9981 Dependence on supplemental oxygen: Secondary | ICD-10-CM

## 2018-07-20 LAB — PROTIME-INR
INR: 1 (ref 0.8–1.2)
Prothrombin Time: 12.6 seconds (ref 11.4–15.2)

## 2018-07-20 LAB — CBC WITH DIFFERENTIAL/PLATELET
Abs Immature Granulocytes: 0.01 10*3/uL (ref 0.00–0.07)
Basophils Absolute: 0 10*3/uL (ref 0.0–0.1)
Basophils Relative: 0 %
Eosinophils Absolute: 0.2 10*3/uL (ref 0.0–0.5)
Eosinophils Relative: 3 %
HCT: 36.2 % — ABNORMAL LOW (ref 39.0–52.0)
Hemoglobin: 11.1 g/dL — ABNORMAL LOW (ref 13.0–17.0)
Immature Granulocytes: 0 %
Lymphocytes Relative: 13 %
Lymphs Abs: 0.8 10*3/uL (ref 0.7–4.0)
MCH: 30.8 pg (ref 26.0–34.0)
MCHC: 30.7 g/dL (ref 30.0–36.0)
MCV: 100.6 fL — ABNORMAL HIGH (ref 80.0–100.0)
Monocytes Absolute: 0.7 10*3/uL (ref 0.1–1.0)
Monocytes Relative: 11 %
Neutro Abs: 4.4 10*3/uL (ref 1.7–7.7)
Neutrophils Relative %: 73 %
Platelets: 153 10*3/uL (ref 150–400)
RBC: 3.6 MIL/uL — ABNORMAL LOW (ref 4.22–5.81)
RDW: 12.7 % (ref 11.5–15.5)
WBC: 6.1 10*3/uL (ref 4.0–10.5)
nRBC: 0 % (ref 0.0–0.2)

## 2018-07-20 LAB — COMPREHENSIVE METABOLIC PANEL
ALT: 17 U/L (ref 0–44)
AST: 14 U/L — ABNORMAL LOW (ref 15–41)
Albumin: 2.8 g/dL — ABNORMAL LOW (ref 3.5–5.0)
Alkaline Phosphatase: 40 U/L (ref 38–126)
Anion gap: 7 (ref 5–15)
BUN: 5 mg/dL — ABNORMAL LOW (ref 8–23)
CO2: 39 mmol/L — ABNORMAL HIGH (ref 22–32)
Calcium: 7.2 mg/dL — ABNORMAL LOW (ref 8.9–10.3)
Chloride: 97 mmol/L — ABNORMAL LOW (ref 98–111)
Creatinine, Ser: 0.45 mg/dL — ABNORMAL LOW (ref 0.61–1.24)
GFR calc Af Amer: >60 mL/min (ref >60)
GFR calc non Af Amer: >60 mL/min (ref >60)
Glucose, Bld: 101 mg/dL — ABNORMAL HIGH (ref 70–99)
Potassium: 3.7 mmol/L (ref 3.5–5.1)
Sodium: 143 mmol/L (ref 135–145)
Total Bilirubin: 0.4 mg/dL (ref 0.3–1.2)
Total Protein: 4.3 g/dL — ABNORMAL LOW (ref 6.5–8.1)

## 2018-07-20 LAB — URINALYSIS, ROUTINE W REFLEX MICROSCOPIC
Bilirubin Urine: NEGATIVE
Glucose, UA: NEGATIVE mg/dL
Hgb urine dipstick: NEGATIVE
Ketones, ur: NEGATIVE mg/dL
Leukocytes,Ua: NEGATIVE
Nitrite: NEGATIVE
Protein, ur: NEGATIVE mg/dL
Specific Gravity, Urine: 1.008 (ref 1.005–1.030)
pH: 6 (ref 5.0–8.0)

## 2018-07-20 LAB — SARS CORONAVIRUS 2 BY RT PCR (HOSPITAL ORDER, PERFORMED IN ~~LOC~~ HOSPITAL LAB): SARS Coronavirus 2: NEGATIVE

## 2018-07-20 LAB — LACTIC ACID, PLASMA
Lactic Acid, Venous: 0.7 mmol/L (ref 0.5–1.9)
Lactic Acid, Venous: 1 mmol/L (ref 0.5–1.9)

## 2018-07-20 LAB — BRAIN NATRIURETIC PEPTIDE: B Natriuretic Peptide: 50.5 pg/mL (ref 0.0–100.0)

## 2018-07-20 LAB — TROPONIN I (HIGH SENSITIVITY)
Troponin I (High Sensitivity): 3 ng/L (ref <18)
Troponin I (High Sensitivity): 4 ng/L (ref <18)

## 2018-07-20 MED ORDER — POTASSIUM CHLORIDE IN NACL 20-0.45 MEQ/L-% IV SOLN
INTRAVENOUS | Status: DC
  Administered 2018-07-21: 02:00:00 via INTRAVENOUS
  Filled 2018-07-20 (×2): qty 1000

## 2018-07-20 MED ORDER — CARVEDILOL 3.125 MG PO TABS
3.1250 mg | ORAL_TABLET | Freq: Two times a day (BID) | ORAL | Status: DC
  Administered 2018-07-21 – 2018-07-24 (×7): 3.125 mg via ORAL
  Filled 2018-07-20 (×7): qty 1

## 2018-07-20 MED ORDER — ESCITALOPRAM OXALATE 20 MG PO TABS
20.0000 mg | ORAL_TABLET | Freq: Every day | ORAL | Status: DC
  Administered 2018-07-20 – 2018-07-24 (×5): 20 mg via ORAL
  Filled 2018-07-20 (×5): qty 1

## 2018-07-20 MED ORDER — ENOXAPARIN SODIUM 40 MG/0.4ML ~~LOC~~ SOLN
40.0000 mg | SUBCUTANEOUS | Status: DC
  Administered 2018-07-20 – 2018-07-23 (×4): 40 mg via SUBCUTANEOUS
  Filled 2018-07-20 (×4): qty 0.4

## 2018-07-20 MED ORDER — ACETAMINOPHEN 650 MG RE SUPP: 650.0000 mg | Freq: Four times a day (QID) | RECTAL | Status: DC | PRN

## 2018-07-20 MED ORDER — TRAMADOL HCL 50 MG PO TABS
50.0000 mg | ORAL_TABLET | Freq: Four times a day (QID) | ORAL | Status: DC | PRN
  Filled 2018-07-20: qty 1

## 2018-07-20 MED ORDER — IPRATROPIUM BROMIDE HFA 17 MCG/ACT IN AERS
2.0000 | INHALATION_SPRAY | RESPIRATORY_TRACT | Status: DC
  Administered 2018-07-20: 2 via RESPIRATORY_TRACT
  Filled 2018-07-20 (×2): qty 12.9

## 2018-07-20 MED ORDER — ARIPIPRAZOLE 5 MG PO TABS
5.0000 mg | ORAL_TABLET | Freq: Every day | ORAL | Status: DC
  Administered 2018-07-21 – 2018-07-24 (×5): 5 mg via ORAL
  Filled 2018-07-20 (×7): qty 1

## 2018-07-20 MED ORDER — TAMSULOSIN HCL 0.4 MG PO CAPS
0.4000 mg | ORAL_CAPSULE | Freq: Every day | ORAL | Status: DC
  Administered 2018-07-20 – 2018-07-24 (×5): 0.4 mg via ORAL
  Filled 2018-07-20 (×5): qty 1

## 2018-07-20 MED ORDER — ALBUTEROL SULFATE (2.5 MG/3ML) 0.083% IN NEBU: 2.5000 mg | INHALATION_SOLUTION | RESPIRATORY_TRACT | Status: DC | PRN

## 2018-07-20 MED ORDER — PRAVASTATIN SODIUM 10 MG PO TABS
20.0000 mg | ORAL_TABLET | Freq: Every day | ORAL | Status: DC
  Administered 2018-07-21 – 2018-07-23 (×3): 20 mg via ORAL
  Filled 2018-07-20 (×3): qty 2

## 2018-07-20 MED ORDER — METHYLPREDNISOLONE SODIUM SUCC 125 MG IJ SOLR
60.0000 mg | Freq: Two times a day (BID) | INTRAMUSCULAR | Status: DC
  Administered 2018-07-20 – 2018-07-21 (×2): 60 mg via INTRAVENOUS
  Filled 2018-07-20 (×2): qty 2

## 2018-07-20 MED ORDER — UMECLIDINIUM BROMIDE 62.5 MCG/INH IN AEPB
1.0000 | INHALATION_SPRAY | Freq: Every day | RESPIRATORY_TRACT | Status: DC
  Administered 2018-07-21: 1 via RESPIRATORY_TRACT
  Filled 2018-07-20: qty 7

## 2018-07-20 MED ORDER — ACETAMINOPHEN 325 MG PO TABS: 650.0000 mg | ORAL_TABLET | Freq: Four times a day (QID) | ORAL | Status: DC | PRN

## 2018-07-20 MED ORDER — ONDANSETRON HCL 4 MG PO TABS: 4.0000 mg | ORAL_TABLET | Freq: Four times a day (QID) | ORAL | Status: DC | PRN

## 2018-07-20 MED ORDER — VITAMIN B-1 100 MG PO TABS
100.0000 mg | ORAL_TABLET | Freq: Every day | ORAL | Status: DC
  Administered 2018-07-20 – 2018-07-24 (×5): 100 mg via ORAL
  Filled 2018-07-20 (×5): qty 1

## 2018-07-20 MED ORDER — POLYETHYLENE GLYCOL 3350 17 G PO PACK: 17.0000 g | PACK | Freq: Every day | ORAL | Status: DC | PRN

## 2018-07-20 MED ORDER — LISINOPRIL 5 MG PO TABS
2.5000 mg | ORAL_TABLET | Freq: Every day | ORAL | Status: DC
  Administered 2018-07-20 – 2018-07-24 (×5): 2.5 mg via ORAL
  Filled 2018-07-20 (×5): qty 1

## 2018-07-20 MED ORDER — FOLIC ACID 1 MG PO TABS
1.0000 mg | ORAL_TABLET | Freq: Every day | ORAL | Status: DC
  Administered 2018-07-20 – 2018-07-24 (×5): 1 mg via ORAL
  Filled 2018-07-20 (×5): qty 1

## 2018-07-20 MED ORDER — ONDANSETRON HCL 4 MG/2ML IJ SOLN
4.0000 mg | Freq: Four times a day (QID) | INTRAMUSCULAR | Status: DC | PRN
  Administered 2018-07-23: 4 mg via INTRAVENOUS
  Filled 2018-07-20: qty 2

## 2018-07-20 MED ORDER — TIOTROPIUM BROMIDE MONOHYDRATE 18 MCG IN CAPS: 18.0000 ug | ORAL_CAPSULE | Freq: Every day | RESPIRATORY_TRACT | Status: DC

## 2018-07-20 MED ORDER — ALBUTEROL SULFATE HFA 108 (90 BASE) MCG/ACT IN AERS
2.0000 | INHALATION_SPRAY | RESPIRATORY_TRACT | Status: DC
  Administered 2018-07-20 (×2): 2 via RESPIRATORY_TRACT
  Filled 2018-07-20 (×2): qty 6.7

## 2018-07-20 MED ORDER — MOMETASONE FURO-FORMOTEROL FUM 100-5 MCG/ACT IN AERO
2.0000 | INHALATION_SPRAY | Freq: Two times a day (BID) | RESPIRATORY_TRACT | Status: DC
  Administered 2018-07-20 – 2018-07-21 (×2): 2 via RESPIRATORY_TRACT
  Filled 2018-07-20: qty 8.8
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
## 2018-07-20 NOTE — ED Triage Notes (Signed)
Pt to ER from home for shortness of breath, history significant for COPD requiring supplemental O2, wears 3L daily. EMS reports on arrival patient was very distressed with accessory muscle use, 125 mg solumedrol given and 2mg  Mag, duoneb was self administered by patient prior to EMS arrival. On arrival patient is in NAD, alert and oriented, skin is warm and dry.  NOTE - patient was seen by EMS yesterday at home and refused transport. Was given 125 solumedrol yesterday by EMS.

## 2018-07-21 ENCOUNTER — Other Ambulatory Visit: Payer: Self-pay

## 2018-07-21 ENCOUNTER — Ambulatory Visit: Payer: Medicaid Other

## 2018-07-21 DIAGNOSIS — F419 Anxiety disorder, unspecified: Secondary | ICD-10-CM | POA: Diagnosis present

## 2018-07-21 DIAGNOSIS — Z7952 Long term (current) use of systemic steroids: Secondary | ICD-10-CM | POA: Diagnosis not present

## 2018-07-21 DIAGNOSIS — Z7951 Long term (current) use of inhaled steroids: Secondary | ICD-10-CM | POA: Diagnosis not present

## 2018-07-21 DIAGNOSIS — J449 Chronic obstructive pulmonary disease, unspecified: Secondary | ICD-10-CM

## 2018-07-21 DIAGNOSIS — Z9981 Dependence on supplemental oxygen: Secondary | ICD-10-CM | POA: Diagnosis not present

## 2018-07-21 DIAGNOSIS — Z1159 Encounter for screening for other viral diseases: Secondary | ICD-10-CM | POA: Diagnosis not present

## 2018-07-21 DIAGNOSIS — J9621 Acute and chronic respiratory failure with hypoxia: Secondary | ICD-10-CM | POA: Diagnosis present

## 2018-07-21 DIAGNOSIS — Z7189 Other specified counseling: Secondary | ICD-10-CM | POA: Diagnosis not present

## 2018-07-21 DIAGNOSIS — R0602 Shortness of breath: Secondary | ICD-10-CM | POA: Diagnosis not present

## 2018-07-21 DIAGNOSIS — E1165 Type 2 diabetes mellitus with hyperglycemia: Secondary | ICD-10-CM | POA: Diagnosis present

## 2018-07-21 DIAGNOSIS — C61 Malignant neoplasm of prostate: Secondary | ICD-10-CM | POA: Diagnosis present

## 2018-07-21 DIAGNOSIS — Z66 Do not resuscitate: Secondary | ICD-10-CM | POA: Diagnosis present

## 2018-07-21 DIAGNOSIS — Z933 Colostomy status: Secondary | ICD-10-CM | POA: Diagnosis not present

## 2018-07-21 DIAGNOSIS — Z85038 Personal history of other malignant neoplasm of large intestine: Secondary | ICD-10-CM | POA: Diagnosis not present

## 2018-07-21 DIAGNOSIS — F319 Bipolar disorder, unspecified: Secondary | ICD-10-CM | POA: Diagnosis present

## 2018-07-21 DIAGNOSIS — Z801 Family history of malignant neoplasm of trachea, bronchus and lung: Secondary | ICD-10-CM | POA: Diagnosis not present

## 2018-07-21 DIAGNOSIS — E785 Hyperlipidemia, unspecified: Secondary | ICD-10-CM | POA: Diagnosis present

## 2018-07-21 DIAGNOSIS — J439 Emphysema, unspecified: Secondary | ICD-10-CM | POA: Diagnosis not present

## 2018-07-21 DIAGNOSIS — Z8546 Personal history of malignant neoplasm of prostate: Secondary | ICD-10-CM | POA: Diagnosis not present

## 2018-07-21 DIAGNOSIS — E119 Type 2 diabetes mellitus without complications: Secondary | ICD-10-CM | POA: Diagnosis present

## 2018-07-21 DIAGNOSIS — C189 Malignant neoplasm of colon, unspecified: Secondary | ICD-10-CM | POA: Diagnosis present

## 2018-07-21 DIAGNOSIS — J9622 Acute and chronic respiratory failure with hypercapnia: Secondary | ICD-10-CM | POA: Diagnosis present

## 2018-07-21 DIAGNOSIS — F17210 Nicotine dependence, cigarettes, uncomplicated: Secondary | ICD-10-CM | POA: Diagnosis present

## 2018-07-21 DIAGNOSIS — J441 Chronic obstructive pulmonary disease with (acute) exacerbation: Secondary | ICD-10-CM | POA: Diagnosis present

## 2018-07-21 DIAGNOSIS — I10 Essential (primary) hypertension: Secondary | ICD-10-CM | POA: Diagnosis present

## 2018-07-21 DIAGNOSIS — Z515 Encounter for palliative care: Secondary | ICD-10-CM | POA: Diagnosis present

## 2018-07-21 DIAGNOSIS — Z9114 Patient's other noncompliance with medication regimen: Secondary | ICD-10-CM | POA: Diagnosis not present

## 2018-07-21 DIAGNOSIS — Z8249 Family history of ischemic heart disease and other diseases of the circulatory system: Secondary | ICD-10-CM | POA: Diagnosis not present

## 2018-07-21 LAB — BASIC METABOLIC PANEL
Anion gap: 7 (ref 5–15)
BUN: 15 mg/dL (ref 8–23)
CO2: 39 mmol/L — ABNORMAL HIGH (ref 22–32)
Calcium: 8.5 mg/dL — ABNORMAL LOW (ref 8.9–10.3)
Chloride: 88 mmol/L — ABNORMAL LOW (ref 98–111)
Creatinine, Ser: 0.6 mg/dL — ABNORMAL LOW (ref 0.61–1.24)
GFR calc Af Amer: >60 mL/min (ref >60)
GFR calc non Af Amer: >60 mL/min (ref >60)
Glucose, Bld: 142 mg/dL — ABNORMAL HIGH (ref 70–99)
Potassium: 4.5 mmol/L (ref 3.5–5.1)
Sodium: 134 mmol/L — ABNORMAL LOW (ref 135–145)

## 2018-07-21 LAB — GLUCOSE, CAPILLARY
Glucose-Capillary: 170 mg/dL — ABNORMAL HIGH (ref 70–99)
Glucose-Capillary: 210 mg/dL — ABNORMAL HIGH (ref 70–99)
Glucose-Capillary: 182 mg/dL — ABNORMAL HIGH (ref 70–99)

## 2018-07-21 LAB — URINE CULTURE: Culture: NO GROWTH

## 2018-07-21 MED ORDER — METFORMIN HCL 500 MG PO TABS
1000.0000 mg | ORAL_TABLET | Freq: Two times a day (BID) | ORAL | Status: DC
  Administered 2018-07-21 – 2018-07-24 (×6): 1000 mg via ORAL
  Filled 2018-07-21 (×6): qty 2

## 2018-07-21 MED ORDER — IPRATROPIUM-ALBUTEROL 0.5-2.5 (3) MG/3ML IN SOLN
3.0000 mL | Freq: Four times a day (QID) | RESPIRATORY_TRACT | Status: DC
  Administered 2018-07-21: 3 mL via RESPIRATORY_TRACT
  Filled 2018-07-21: qty 3

## 2018-07-21 MED ORDER — INSULIN ASPART 100 UNIT/ML ~~LOC~~ SOLN: 0.0000 [IU] | Freq: Every day | SUBCUTANEOUS | Status: DC

## 2018-07-21 MED ORDER — IPRATROPIUM-ALBUTEROL 0.5-2.5 (3) MG/3ML IN SOLN
3.0000 mL | Freq: Four times a day (QID) | RESPIRATORY_TRACT | Status: DC
  Administered 2018-07-21 – 2018-07-23 (×9): 3 mL via RESPIRATORY_TRACT
  Filled 2018-07-21 (×9): qty 3

## 2018-07-21 MED ORDER — METHYLPREDNISOLONE SODIUM SUCC 125 MG IJ SOLR
60.0000 mg | Freq: Four times a day (QID) | INTRAMUSCULAR | Status: DC
  Administered 2018-07-21 – 2018-07-22 (×4): 60 mg via INTRAVENOUS
  Filled 2018-07-21 (×4): qty 2

## 2018-07-21 MED ORDER — INSULIN ASPART 100 UNIT/ML ~~LOC~~ SOLN
0.0000 [IU] | Freq: Three times a day (TID) | SUBCUTANEOUS | Status: DC
  Administered 2018-07-21: 2 [IU] via SUBCUTANEOUS
  Administered 2018-07-21: 3 [IU] via SUBCUTANEOUS
  Administered 2018-07-22 (×2): 2 [IU] via SUBCUTANEOUS
  Administered 2018-07-23: 1 [IU] via SUBCUTANEOUS
  Administered 2018-07-23: 09:00:00 5 [IU] via SUBCUTANEOUS
  Administered 2018-07-23: 1 [IU] via SUBCUTANEOUS
  Administered 2018-07-24: 3 [IU] via SUBCUTANEOUS

## 2018-07-21 MED ORDER — LORAZEPAM 2 MG/ML IJ SOLN: 1.0000 mg | INTRAMUSCULAR | Status: DC | PRN

## 2018-07-21 NOTE — Progress Notes (Signed)
[facility] Progress Note  Subjective: states he is not doing any better today, difficult to breath last night overnight  Vitals:   07/21/18 0344 07/21/18 0758 07/21/18 0908 07/21/18 1115  BP: 112/64  106/80 118/71  Pulse: 80   79  Resp: 19   18  Temp: 98.7 F (37.1 C)   98.2 F (36.8 C)  TempSrc: Oral   Oral  SpO2: 96% 97%  97%  Weight:      Height:        Inpatient medications: . ARIPiprazole  5 mg Oral Daily  . carvedilol  3.125 mg Oral BID WC  . enoxaparin (LOVENOX) injection  40 mg Subcutaneous Q24H  . escitalopram  20 mg Oral Daily  . folic acid  1 mg Oral Daily  . insulin aspart  0-5 Units Subcutaneous QHS  . insulin aspart  0-9 Units Subcutaneous TID WC  . ipratropium-albuterol  3 mL Nebulization Q6H  . lisinopril  2.5 mg Oral Daily  . metFORMIN  1,000 mg Oral BID WC  . methylPREDNISolone (SOLU-MEDROL) injection  60 mg Intravenous Q6H  . pravastatin  20 mg Oral q1800  . tamsulosin  0.4 mg Oral Daily  . thiamine  100 mg Oral Daily    acetaminophen **OR** acetaminophen, albuterol, LORazepam, ondansetron **OR** ondansetron (ZOFRAN) IV, polyethylene glycol, traMADol  Exam: Gen alert, no distress No jvd or bruits Chest poor air movement bilat / ant and post, minimal wheezing RRR no MRG Abd soft ntnd no mass or ascites +bs GU normal male MS no joint effusions or deformity Ext no LE edema / no wounds or ulcers Neuro is alert, Ox 3 , nf    Presentation Summary: Taylor [name] is a 63 y.o. male with hx of prostate Ca, home O2, COPD, depression, colon cancer, bipolar d/o, asthma who presented w/ SOB brought by EMS.  Per EMS was in distress on their arrival w/ accessory musc use.  They gave IV solumedrol, 2gm Mg and duoneb.  On arrival pt was feeling better but still requiring extra O2 at 5L [place].  We are asked to admit for COPD exacerbation.   Patient's chart shows he was admitted 20 times in 2019 and in 2020 so far has been admitted 8 times in 6 mos.  Has  a hx of nonadherence to his medications.  Almost all of these admissions were for COPD flare up.  Patient states he "doesn't always take his medicine" like he is supposed to. Won't elaborate much on the reasons though he does say that he can't drive so when he runs out he is dependent on others to get a ride to pharmacy.    Pt lives w a good friend. Has a sister Hilda Blades in the area.  He lives in [place].   Pt denies any change in sputum color or volume, no fevers, chills, or chest pain.  Feels much better now, after getting the IV steroids and nebulizer.     Home meds:  - aripiprazole 5 qd/ escitalopram 20 qd  - lisinopril 2.5 qd/ carvedilol 3.125 bid  - lovastatin 20 qd/ tamsulosin 0.4  - metformin 1 gm bid  - tiotropium 18 ug/ mometasone-formoterol 2 puffs bid/ prn albut nebs  - prn's/ vitamins/ supplements       Hospital Course:  Acute on chron resp failure: due to COPD flare, multiple admits for this in the last years (28 total 2019-2020) - not much better today, will ^ IV solumedrol 60 bid > 4x per  day - ordered duoneb q 6hr, prn albuterol - O2 support - no infectious sx/ sx's, holding on abx - severe lung disease at baseline, hx of prior DNR's > talked w/ pt who requesting DNR status, have ordered  DM2 - use SSI while here - cont metformin, pt request - cont SSI mild  HL - statin  HTN - cont acei/ BB  Psych - SSRI + aripiprazole - added prn IV ativan for anxiety/ tremulousness  Dispo - home when medically stable   DVT prophylaxis: lovenox Family communication: none here Code status: DNR, pt request Admit status: OBV > IP Bed type: telemetry medical    Kelly Splinter / [facility] [phone] 07/21/2018, 12:38 PM   Recent Labs  Lab 07/20/18 1130 07/21/18 0547  NA 143 134*  K 3.7 4.5  CL 97* 88*  CO2 39* 39*  GLUCOSE 101* 142*  BUN 5* 15  CREATININE 0.45* 0.60*  CALCIUM 7.2* 8.5*   Recent Labs  Lab 07/20/18 1130  AST 14*  ALT 17  ALKPHOS 40  BILITOT  0.4  PROT 4.3*  ALBUMIN 2.8*   Recent Labs  Lab 07/20/18 1130  WBC 6.1  NEUTROABS 4.4  HGB 11.1*  HCT 36.2*  MCV 100.6*  PLT 153   Iron/TIBC/Ferritin/ %Sat    Component Value Date/Time   IRON 52 06/13/2017 1003   TIBC 281 06/13/2017 1003   FERRITIN 90 04/26/2018 1152   IRONPCTSAT 18 06/13/2017 1003

## 2018-07-22 ENCOUNTER — Ambulatory Visit: Payer: Medicaid Other

## 2018-07-22 ENCOUNTER — Encounter: Payer: Self-pay | Admitting: Radiation Oncology

## 2018-07-22 DIAGNOSIS — J441 Chronic obstructive pulmonary disease with (acute) exacerbation: Secondary | ICD-10-CM

## 2018-07-22 DIAGNOSIS — F319 Bipolar disorder, unspecified: Secondary | ICD-10-CM

## 2018-07-22 DIAGNOSIS — Z7189 Other specified counseling: Secondary | ICD-10-CM

## 2018-07-22 DIAGNOSIS — E119 Type 2 diabetes mellitus without complications: Secondary | ICD-10-CM

## 2018-07-22 DIAGNOSIS — Z515 Encounter for palliative care: Secondary | ICD-10-CM

## 2018-07-22 DIAGNOSIS — J9621 Acute and chronic respiratory failure with hypoxia: Secondary | ICD-10-CM

## 2018-07-22 DIAGNOSIS — I10 Essential (primary) hypertension: Secondary | ICD-10-CM

## 2018-07-22 LAB — GLUCOSE, CAPILLARY
Glucose-Capillary: 196 mg/dL — ABNORMAL HIGH (ref 70–99)
Glucose-Capillary: 95 mg/dL (ref 70–99)
Glucose-Capillary: 169 mg/dL — ABNORMAL HIGH (ref 70–99)
Glucose-Capillary: 177 mg/dL — ABNORMAL HIGH (ref 70–99)

## 2018-07-22 MED ORDER — MOMETASONE FURO-FORMOTEROL FUM 100-5 MCG/ACT IN AERO
2.0000 | INHALATION_SPRAY | Freq: Two times a day (BID) | RESPIRATORY_TRACT | Status: DC
  Administered 2018-07-22 – 2018-07-24 (×4): 2 via RESPIRATORY_TRACT
  Filled 2018-07-22: qty 8.8

## 2018-07-22 MED ORDER — METHYLPREDNISOLONE SODIUM SUCC 40 MG IJ SOLR
40.0000 mg | Freq: Three times a day (TID) | INTRAMUSCULAR | Status: DC
  Administered 2018-07-22 – 2018-07-24 (×6): 40 mg via INTRAVENOUS
  Filled 2018-07-22 (×6): qty 1

## 2018-07-22 MED ORDER — NICOTINE 7 MG/24HR TD PT24
7.0000 mg | MEDICATED_PATCH | Freq: Every day | TRANSDERMAL | Status: DC
  Administered 2018-07-22 – 2018-07-24 (×3): 7 mg via TRANSDERMAL
  Filled 2018-07-22 (×3): qty 1

## 2018-07-22 MED ORDER — AZITHROMYCIN 500 MG PO TABS
500.0000 mg | ORAL_TABLET | Freq: Every day | ORAL | Status: DC
  Administered 2018-07-22 – 2018-07-24 (×3): 500 mg via ORAL
  Filled 2018-07-22 (×3): qty 1

## 2018-07-22 MED ORDER — ALUM & MAG HYDROXIDE-SIMETH 200-200-20 MG/5ML PO SUSP
15.0000 mL | Freq: Four times a day (QID) | ORAL | Status: DC | PRN
  Administered 2018-07-22 – 2018-07-23 (×2): 15 mL via ORAL
  Filled 2018-07-22 (×2): qty 30

## 2018-07-22 NOTE — Progress Notes (Signed)
Patient ID: Taylor [name], male   DOB: 01/22/1955, 63 y.o.   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  He was admitted for COPD exacerbation and started on intravenous steroids.  Assessment & Plan:   Principal Problem:   COPD exacerbation ([place]) Active Problems:   COPD  GOLD III   Bipolar 1 disorder (HCC)   Acute on chronic respiratory failure with hypoxia and hypercapnia (HCC)   SOB (shortness of breath)   Prostate cancer (HCC)   Acute on chronic respiratory failure with hypoxia (HCC)   Anxiety and depression   Essential hypertension   Diabetes mellitus type II, non insulin dependent ([place])  COPD exacerbation Acute on chronic hypoxic respiratory failure Tobacco abuse -Has had multiple admissions over the last couple of years, 28 total in 2019-2020 -Patient normally wears oxygen via nasal cannula 3 L/min.  Trying to cut down on his smoking but still currently smokes. -Required up to 5 L oxygen via nasal cannula.  Currently on 3 L oxygen. -Incentive spirometry -Counseled about tobacco cessation -Decrease her Medrol to 40 mg IV every 8 hours.  Add Dulera.  Continue duo nebs. -Palliative Care evaluation for goals of care discussion because of overall poor prognosis and recurrent hospitalizations -We will add oral Zithromax.   Diabetes mellitus type 2 with hyperglycemia -Continue metformin as per patient request -Continue CBGs with SSI  Hyperlipidemia--continue statin  Hypertension--continue ACE inhibitor and beta-blocker.  Blood pressure stable.  Bipolar  disorder -Continue aripiprazole and escitalopram.  Outpatient follow-up    DVT prophylaxis: Lovenox Code Status: Full Family Communication: None at bedside Disposition Plan: Home in 1 to 2 days if clinically improves   Consultants: None  Procedures: None  Antimicrobials: None   Subjective: Patient seen and examined at bedside.  No overnight fever, nausea or vomiting reported.  Feels slightly better but still very short of breath.  Objective: Vitals:   07/21/18 2227 07/22/18 0500 07/22/18 0525 07/22/18 0846  BP: 112/69  118/76   Pulse: 86  85   Resp: 16  17   Temp: 98.6 F (37 C)  97.6 F (36.4 C)   TempSrc: Oral  Oral   SpO2: 97%  99% 97%  Weight:  64.8 kg    Height:        Intake/Output Summary (Last 24 hours) at 07/22/2018 1001 Last data filed at 07/21/2018 2019 Gross per 24 hour  Intake -  Output 1200 ml  Net -1200 ml   Filed Weights   07/20/18 1154 07/22/18 0500  Weight: 67.1 kg 64.8 kg    Examination:  General exam: Appears calm and comfortable.  Looks older than stated age.  Chronically ill. Respiratory system: Bilateral decreased breath sounds at bases with scattered crackles and some wheezing Cardiovascular system: S1 & S2 heard, Rate controlled Gastrointestinal system: Abdomen is nondistended, soft and nontender. Normal bowel sounds heard. Extremities: No cyanosis, clubbing; trace edema  Data Reviewed: I have personally reviewed following labs and imaging studies  CBC: Recent Labs  Lab 07/20/18 1130  WBC 6.1  NEUTROABS 4.4  HGB 11.1*  HCT 36.2*  MCV 100.6*  PLT 623   Basic Metabolic  Panel: Recent Labs  Lab 07/20/18 1130 07/21/18 0547  NA 143 134*  K 3.7 4.5  CL 97* 88*  CO2 39* 39*  GLUCOSE 101* 142*  BUN 5* 15  CREATININE 0.45* 0.60*  CALCIUM 7.2* 8.5*   GFR: Estimated Creatinine Clearance: 86.6 mL/min (A) (by C-G formula based on SCr of 0.6 mg/dL (L)). Liver Function Tests: Recent Labs  Lab 07/20/18 1130  AST 14*  ALT  17  ALKPHOS 40  BILITOT 0.4  PROT 4.3*  ALBUMIN 2.8*   No results for input(s): LIPASE, AMYLASE in the last 168 hours. No results for input(s): AMMONIA in the last 168 hours. Coagulation Profile: Recent Labs  Lab 07/20/18 1130  INR 1.0   Cardiac Enzymes: No results for input(s): CKTOTAL, CKMB, CKMBINDEX, TROPONINI in the last 168 hours. BNP (last 3 results) No results for input(s): PROBNP in the last 8760 hours. HbA1C: No results for input(s): HGBA1C in the last 72 hours. CBG: Recent Labs  Lab 07/21/18 1150 07/21/18 1656 07/21/18 2224 07/22/18 0815  GLUCAP 170* 210* 182* 196*   Lipid Profile: No results for input(s): CHOL, HDL, LDLCALC, TRIG, CHOLHDL, LDLDIRECT in the last 72 hours. Thyroid Function Tests: No results for input(s): TSH, T4TOTAL, FREET4, T3FREE, THYROIDAB in the last 72 hours. Anemia Panel: No results for input(s): VITAMINB12, FOLATE, FERRITIN, TIBC, IRON, RETICCTPCT in the last 72 hours. Sepsis Labs: Recent Labs  Lab 07/20/18 1229 07/20/18 1420  LATICACIDVEN 0.7 1.0    Recent Results (from the past 240 hour(s))  Urine culture     Status: None   Collection Time: 07/20/18 12:00 PM   Specimen: Urine, Random  Result Value Ref Range Status   Specimen Description URINE, RANDOM  Final   Special Requests NONE  Final   Culture   Final    NO GROWTH Performed at [facility], [place] [address], [place], [place] [address]    Report Status 07/21/2018 FINAL  Final  Culture, blood (routine x 2)     Status: None (Preliminary result)   Collection Time: 07/20/18 12:00 PM   Specimen: BLOOD RIGHT ARM  Result Value Ref Range Status   Specimen Description BLOOD RIGHT ARM  Final   Special Requests   Final    BOTTLES DRAWN AEROBIC AND ANAEROBIC Blood Culture adequate volume   Culture   Final    NO GROWTH 2 DAYS Performed at [facility], [place] [address] [place], [place] [address]    Report Status PENDING  Incomplete  SARS Coronavirus 2 (CEPHEID-  Performed in [facility] lab), Hosp Order     Status: None   Collection Time: 07/20/18 12:01 PM   Specimen: Nasopharyngeal Swab  Result Value Ref Range Status   SARS Coronavirus 2 NEGATIVE NEGATIVE Final    Comment: (NOTE) If result is NEGATIVE SARS-CoV-2 target nucleic acids are NOT DETECTED. The SARS-CoV-2 RNA is generally detectable in upper and lower  respiratory specimens during the acute phase of infection. The lowest  concentration of SARS-CoV-2 viral copies this assay can detect is 250  copies / mL. A negative result does not preclude SARS-CoV-2 infection  and should not be used as the sole basis for treatment or other  patient management decisions.  A negative result may occur with  improper specimen collection / handling, submission of specimen other  than nasopharyngeal swab, presence of viral mutation(s) within the  areas targeted by this assay, and inadequate number of viral copies  (<250 copies / mL). A  negative result must be combined with clinical  observations, patient history, and epidemiological information. If result is POSITIVE SARS-CoV-2 target nucleic acids are DETECTED. The SARS-CoV-2 RNA is generally detectable in upper and lower  respiratory specimens dur ing the acute phase of infection.  Positive  results are indicative of active infection with SARS-CoV-2.  Clinical  correlation with patient history and other diagnostic information is  necessary to determine patient infection status.  Positive results do  not rule out bacterial infection or co-infection with other viruses. If result is PRESUMPTIVE POSTIVE SARS-CoV-2 nucleic acids MAY BE PRESENT.   A presumptive positive result was obtained on the submitted specimen  and confirmed on repeat testing.  While 2019 novel coronavirus  (SARS-CoV-2) nucleic acids may be present in the submitted sample  additional confirmatory testing may be necessary for epidemiological  and / or clinical management  purposes  to differentiate between  SARS-CoV-2 and other Sarbecovirus currently known to infect humans.  If clinically indicated additional testing with an alternate test  methodology [phone]) is advised. The SARS-CoV-2 RNA is generally  detectable in upper and lower respiratory sp ecimens during the acute  phase of infection. The expected result is Negative. Fact Sheet for Patients:  StrictlyIdeas.no Fact Sheet for Healthcare Providers: BankingDealers.co.za This test is not yet approved or cleared by the [place] FDA and has been authorized for detection and/or diagnosis of SARS-CoV-2 by FDA under an Emergency Use Authorization (EUA).  This EUA will remain in effect (meaning this test can be used) for the duration of the COVID-19 declaration under Section 564(b)(1) of the Act, 21 U.S.C. section 360bbb-3(b)(1), unless the authorization is terminated or revoked sooner. Performed at [facility], [place] [address] [place], [place] [address]   Culture, blood (routine x 2)     Status: None (Preliminary result)   Collection Time: 07/20/18 12:04 PM   Specimen: BLOOD RIGHT HAND  Result Value Ref Range Status   Specimen Description BLOOD RIGHT HAND  Final   Special Requests   Final    BOTTLES DRAWN AEROBIC AND ANAEROBIC Blood Culture results may not be optimal due to an inadequate volume of blood received in culture bottles   Culture   Final    NO GROWTH 2 DAYS Performed at [facility], [place] [address] [place], [place] [address]    Report Status PENDING  Incomplete         Radiology Studies: Dg Chest 1 View  Result Date: 07/20/2018 CLINICAL DATA:  Shortness of breath EXAM: CHEST  1 VIEW COMPARISON:  06/27/2018 FINDINGS: Cardiac shadows within normal limits. The lungs are hyperinflated without focal infiltrate, effusion or pneumothorax. Old rib fractures are seen on the right. No acute abnormality noted. IMPRESSION: COPD  without acute abnormality.  No change from the prior exam. Electronically Signed   By: Inez Catalina M.D.   On: 07/20/2018 12:25        Scheduled Meds: . ARIPiprazole  5 mg Oral Daily  . carvedilol  3.125 mg Oral BID WC  . enoxaparin (LOVENOX) injection  40 mg Subcutaneous Q24H  . escitalopram  20 mg Oral Daily  . folic acid  1 mg Oral Daily  . insulin aspart  0-5 Units Subcutaneous QHS  . insulin aspart  0-9 Units Subcutaneous TID WC  . ipratropium-albuterol  3 mL Nebulization Q6H  . lisinopril  2.5 mg Oral Daily  . metFORMIN  1,000 mg Oral BID WC  . methylPREDNISolone (SOLU-MEDROL) injection  60  mg Intravenous Q6H  . nicotine  7 mg Transdermal Q0600  . pravastatin  20 mg Oral q1800  . tamsulosin  0.4 mg Oral Daily  . thiamine  100 mg Oral Daily   Continuous Infusions:   LOS: 1 day        [name], MD [facility] 07/22/2018, 10:01 AM

## 2018-07-22 NOTE — Evaluation (Signed)
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  He was admitted for COPD exacerbation and started on intravenous steroids.  Clinical Impression  Pt admitted with above. Pt functioning at baseline. Pt very limited with ambulation and mobility due to DOE. Pt unable to talk and walk, this his baseline. Pt reports his trailer is really hot despite putting in the [facility] unit. Pt reports roommate is currently there and helps him with grocery shopping. Pt doesn't leave house due to humid weather and decreased activity tolerance. Due to severity of COPD, suspect that HHPT will not be advantageous for him. Acute PT to cont to follow.    Follow Up Recommendations No PT follow up;Supervision - Intermittent    Equipment Recommendations  None recommended by PT    Recommendations for Other Services       Precautions / Restrictions Precautions Precautions: Fall Precaution Comments: watch SpO2 Restrictions Weight Bearing Restrictions: No      Mobility  Bed Mobility Overal bed mobility: Modified Independent             General bed mobility comments: no difficulty, HOB flat  Transfers Overall transfer level: Needs assistance Equipment used: None Transfers: Sit to/from Stand Sit to Stand: Supervision         General transfer comment: no difficulty, increased time for energy conservation  Ambulation/Gait Ambulation/Gait assistance: Supervision Gait Distance (Feet): 130 Feet Assistive device: None Gait Pattern/deviations: Step-through pattern;Decreased stride length Gait  velocity: dec Gait velocity interpretation: <1.31 ft/sec, indicative of household ambulator General Gait Details: pt with DOE, requiring 1 standing rest break. SpO2 >96% on 3lO2 via [place]  Stairs            Wheelchair Mobility    Modified Rankin (Stroke Patients Only)       Balance Overall balance assessment: Mild deficits observed, not formally tested                                           Pertinent Vitals/Pain Pain Assessment: No/denies pain    Home Living Family/patient expects to be discharged to:: Private residence Living Arrangements: Non-relatives/Friends(has a roomate) Available Help at Discharge: Friend(s);Available PRN/intermittently Type of Home: House Home Access: Stairs to enter Entrance Stairs-Rails: Right Entrance Stairs-Number of Steps: 3 Home Layout: One level Home Equipment: Shower seat - built in Additional Comments:  only Dealer, pt has been on disabillty    Prior Function Level of Independence: Independent         Comments: pt with limited activity due to DOE. pt doesn't drive, roomate does the grocery shopping     Hand Dominance   Dominant Hand: Right    Extremity/Trunk Assessment   Upper Extremity Assessment Upper Extremity Assessment: Generalized weakness    Lower Extremity Assessment Lower Extremity Assessment: Generalized weakness    Cervical / Trunk Assessment Cervical / Trunk Assessment: Kyphotic  Communication   Communication: No difficulties  Cognition Arousal/Alertness: Awake/alert Behavior During Therapy: Flat affect Overall Cognitive Status: Within Functional Limits for tasks assessed  General Comments General comments (skin integrity, edema, etc.): pt set up at sink for bath    Exercises     Assessment/Plan    PT Assessment Patient needs continued PT services  PT Problem List Decreased strength;Decreased range of  motion;Decreased activity tolerance;Decreased balance;Decreased mobility;Cardiopulmonary status limiting activity       PT Treatment Interventions DME instruction;Gait training;Stair training;Therapeutic activities;Functional mobility training;Therapeutic exercise;Balance training    PT Goals (Current goals can be found in the Care Plan section)  Acute Rehab PT Goals Patient Stated Goal: home PT Goal Formulation: With patient Time For Goal Achievement: 08/05/18 Potential to Achieve Goals: Good Additional Goals Additional Goal #1: Pt to score >19 on DGI to indicate minimal falls risk.    Frequency Min 3X/week   Barriers to discharge        Co-evaluation               AM-PAC PT "6 Clicks" Mobility  Outcome Measure Help needed turning from your back to your side while in a flat bed without using bedrails?: None Help needed moving from lying on your back to sitting on the side of a flat bed without using bedrails?: None Help needed moving to and from a bed to a chair (including a wheelchair)?: None Help needed standing up from a chair using your arms (e.g., wheelchair or bedside chair)?: None Help needed to walk in hospital room?: A Little Help needed climbing 3-5 steps with a railing? : A Little 6 Click Score: 22    End of Session Equipment Utilized During Treatment: Gait belt;Oxygen Activity Tolerance: Patient tolerated treatment well Patient left: in chair;with call bell/phone within reach Nurse Communication: Mobility status PT Visit Diagnosis: Unsteadiness on feet (R26.81);Difficulty in walking, not elsewhere classified (R26.2)    Time: 1137-1200 PT Time Calculation (min) (ACUTE ONLY): 23 min   Charges:   PT Evaluation $PT Eval Moderate Complexity: 1 Mod PT Treatments $Gait Training: 8-22 mins        Kittie Plater, PT, DPT Acute Rehabilitation Services Pager #: [phone] Office #: [phone]   Berline Lopes 07/22/2018, 1:23 PM

## 2018-07-22 NOTE — Consult Note (Addendum)
Consultation Note Date: 07/22/2018   Patient Name: Taylor [name]  DOB: [birth year]  MRN: [id number]  Age / Sex: 63 y.o., male  PCP: Taylor Perna, NP Referring Physician: [name], MD  Reason for Consultation: Establishing goals of care  HPI/Patient Profile: 63 y.o. male  with past medical history of severe COPD (3L at home; multiple recurrent admissions 9 in 6 mo), current smoker, diabetes, prostate cancer, colon cancer, bipolar admitted on 07/20/2018 with SOB with recurrent COPD exacerbation.   Clinical Assessment and Goals of Care: I met today with Taylor [name]. He is very pleasant but tired and full from his lunch (appetite is good). He tells me that his breathing is much better.   When I discuss further with Taylor [name] about his COPD and his hopes and understanding he tells me that his doctors tell him to stop smoking. We discussed that this would help as would close follow up and better compliance with his medication. I asked about his medications and he tells me that he can afford medications but that he has a difficult time getting to pharmacy sometimes. His roommate is helpful but travels for work often leaving him with no ride to the pharmacy. I question if there is a pharmacy near him that could offer delivery service??  We further discuss coming in and out of the hospital. He tells me that he feels better and can "rest and take a break" when he is in the hospital. His home is very warm with poorly functional A/C so this makes him warm and breathing worse. He tells me that he does not mind coming to the hospital. Overall he tells me that he wants to get better and feel better. He is open to support at home to assist him. However, he is happy with his current living situation. He names his sister, Taylor [name], as his surrogate decision maker and says they are close. He confirms DNR  wishes.   I believe that Taylor [name] feels safe in the hospital where it is cool, comfortable, good meals, and much support and staff managing his medications for him. I doubt that there will be much to try and keep him out of the hospital at this stage and I believe he feels safe here. We could consider hospice care but I doubt this would keep him out of the hospital but may give him the support he needs at home. Although he is likely eligible for hospice with prognosis < 6 months I am not sure this is aligned with his goals at this time.   Primary Decision Maker PATIENT    SUMMARY OF RECOMMENDATIONS   - Home with outpatient palliative to follow up to consider hospice support - Assist with access to medications (pharmacy that can deliver to him?)  Code Status/Advance Care Planning:  DNR   Symptom Management:   Per primary. I would not provide prn morphine to Taylor [name]. This may be needed in the near future but would need to be provided and closely  managed/monitored by hospice.   Palliative Prophylaxis:   Bowel Regimen, Delirium Protocol and Frequent Pain Assessment  Psycho-social/Spiritual:   Desire for further Chaplaincy support:no  Additional Recommendations: Caregiving  Support/Resources and Education on Hospice  Prognosis:   Overall prognosis poor with severe COPD.   Discharge Planning: Home with Palliative Services      Primary Diagnoses: Present on Admission:  Acute on chronic respiratory failure with hypoxia (HCC)  Acute on chronic respiratory failure with hypoxia and hypercapnia (HCC)  Anxiety and depression  Bipolar 1 disorder (HCC)  COPD exacerbation (HCC)  COPD  GOLD III  Essential hypertension  Prostate cancer (HCC)  SOB (shortness of breath)   I have reviewed the medical record, interviewed the patient and family, and examined the patient. The following aspects are pertinent.  Past Medical History:  Diagnosis Date   Acute on  chronic respiratory failure (HCC)    Asthma    Bipolar 1 disorder ([place]) 02/05/2012   Colon cancer ([place]) 04/11/11   adenocarcinoma of colon, 7/19 nodes pos.FINISHED CHEMO/DR. SHERRILL   COPD (chronic obstructive pulmonary disease) (HCC)    SMOKER   Depression    Dyspnea    Emphysema of lung (HCC)    Full dentures    Hemorrhoids    On home oxygen therapy    "3L; 24/7" (05/29/2017)   Oxygen deficiency    Pneumonia ~ 2016   "double pneumonia"   Prostate cancer ([place])    Gleason score = 7, supposed to have radiation therapy but he has not followed up (05/29/2017)   Rib fractures    hx of   Social History   Socioeconomic History   Marital status: Divorced    Spouse name: Not on file   Number of children: 1   Years of education: Not on file   Highest education level: Not on file  Occupational History   Occupation: disability pending  Social Designer, fashion/clothing strain: Not hard at all   Food insecurity    Worry: Never true    Inability: Never true   Transportation needs    Medical: Yes    Non-medical: Yes  Tobacco Use   Smoking status: Former Smoker    Packs/day: 0.10    Years: 46.00    Pack years: 4.60    Types: Cigarettes    Quit date: 05/21/2017    Years since quitting: 1.1   Smokeless tobacco: Former Systems developer    Types: Chew  Substance and Sexual Activity   Alcohol use: Not Currently    Comment: h/o use in the past, no h/o heavy use   Drug use: No   Sexual activity: Not Currently  Lifestyle   Physical activity    Days per week: 0 days    Minutes per session: 0 min   Stress: Very much  Relationships   Social connections    Talks on phone: More than three times a week    Gets together: Never    Attends religious service: Never    Active member of club or organization: No    Attends meetings of clubs or organizations: Never    Relationship status: Divorced  Other Topics Concern   Not on file  Social History Narrative   Lives  alone-divorced, disabled, in [place] 1970's   Brother, Taylor [name] and his wife Taylor [name] assist   Prior occupation: Clinical biochemist   Family History  Problem Relation Age of Onset   Heart disease Father    Heart failure Mother  Heart disease Mother    Lung cancer Maternal Uncle        smoked   Scheduled Meds:  ARIPiprazole  5 mg Oral Daily   azithromycin  500 mg Oral Daily   carvedilol  3.125 mg Oral BID WC   enoxaparin (LOVENOX) injection  40 mg Subcutaneous Q24H   escitalopram  20 mg Oral Daily   folic acid  1 mg Oral Daily   insulin aspart  0-5 Units Subcutaneous QHS   insulin aspart  0-9 Units Subcutaneous TID WC   ipratropium-albuterol  3 mL Nebulization Q6H   lisinopril  2.5 mg Oral Daily   metFORMIN  1,000 mg Oral BID WC   methylPREDNISolone (SOLU-MEDROL) injection  40 mg Intravenous Q8H   mometasone-formoterol  2 puff Inhalation BID   nicotine  7 mg Transdermal Q0600   pravastatin  20 mg Oral q1800   tamsulosin  0.4 mg Oral Daily   thiamine  100 mg Oral Daily   Continuous Infusions: PRN Meds:.acetaminophen **OR** acetaminophen, albuterol, LORazepam, ondansetron **OR** ondansetron (ZOFRAN) IV, polyethylene glycol, traMADol Allergies  Allergen Reactions   Codeine Nausea And Vomiting   Review of Systems  Constitutional: Positive for activity change and fatigue. Negative for appetite change.  Respiratory: Positive for shortness of breath.        SOB improved; close to baseline  Neurological: Positive for weakness.    Physical Exam Vitals signs and nursing note reviewed.  Constitutional:      General: He is not in acute distress.    Appearance: He is ill-appearing.     Comments: Frail   Cardiovascular:     Rate and Rhythm: Normal rate.  Pulmonary:     Effort: No tachypnea, accessory muscle usage or respiratory distress.     Comments: Mildly labored with conversation; he has to take his time to speak and catch his breathe Abdominal:      General: Abdomen is flat.  Neurological:     Mental Status: He is alert and oriented to person, place, and time.  Psychiatric:        Mood and Affect: Mood normal. Mood is not anxious or depressed. Affect is not labile.        Speech: Speech normal.        Behavior: Behavior normal.        Judgment: Judgment normal.     Vital Signs: BP 113/70 (BP Location: Right Arm)    Pulse 79    Temp 98.6 F (37 C) (Oral)    Resp 17    Ht '5\' 11"'  (1.803 m)    Wt 64.8 kg    SpO2 99%    BMI 19.92 kg/m  Pain Scale: 0-10   Pain Score: 0-No pain   SpO2: SpO2: 99 % O2 Device:SpO2: 99 % O2 Flow Rate: .O2 Flow Rate (L/min): 3 L/min(decreased from 5l. 99%)  IO: Intake/output summary:   Intake/Output Summary (Last 24 hours) at 07/22/2018 1425 Last data filed at 07/22/2018 0900 Gross per 24 hour  Intake 480 ml  Output 1200 ml  Net -720 ml    LBM: Last BM Date: 07/21/18 Baseline Weight: Weight: 67.1 kg Most recent weight: Weight: 64.8 kg     Palliative Assessment/Data:     Time In: 1345 Time Out: 1435 Time Total: 50 min Greater than 50%  of this time was spent counseling and coordinating care related to the above assessment and plan.  Signed by: Vinie Sill, NP Palliative Medicine Team Pager # [phone] (M-F  8a-5p) Team Phone # [phone] (Nights/Weekends)
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
## 2018-07-23 ENCOUNTER — Ambulatory Visit: Payer: Medicaid Other

## 2018-07-23 DIAGNOSIS — F419 Anxiety disorder, unspecified: Secondary | ICD-10-CM

## 2018-07-23 DIAGNOSIS — F329 Major depressive disorder, single episode, unspecified: Secondary | ICD-10-CM

## 2018-07-23 LAB — GLUCOSE, CAPILLARY
Glucose-Capillary: 265 mg/dL — ABNORMAL HIGH (ref 70–99)
Glucose-Capillary: 127 mg/dL — ABNORMAL HIGH (ref 70–99)
Glucose-Capillary: 131 mg/dL — ABNORMAL HIGH (ref 70–99)
Glucose-Capillary: 174 mg/dL — ABNORMAL HIGH (ref 70–99)

## 2018-07-23 MED ORDER — IPRATROPIUM-ALBUTEROL 0.5-2.5 (3) MG/3ML IN SOLN
3.0000 mL | Freq: Three times a day (TID) | RESPIRATORY_TRACT | Status: DC
  Administered 2018-07-24: 08:00:00 3 mL via RESPIRATORY_TRACT
  Filled 2018-07-23: qty 3

## 2018-07-23 NOTE — Progress Notes (Signed)
Patient ID: Taylor [name], male   DOB: [birth year], 63 y.o.   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  He was admitted for COPD exacerbation and started on intravenous steroids.  Assessment & Plan:   Principal Problem:   COPD exacerbation ([place]) Active Problems:   COPD  GOLD III   Bipolar 1 disorder (HCC)   Acute on chronic respiratory failure with hypoxia and hypercapnia (HCC)   SOB (shortness of breath)   Prostate cancer (HCC)   Acute on chronic respiratory failure with hypoxia (HCC)   Anxiety and depression   Essential hypertension   Diabetes mellitus type II, non insulin dependent (HCC)   Goals of care, counseling/discussion   Palliative care encounter  COPD exacerbation Acute on chronic hypoxic respiratory failure Tobacco abuse -Has had multiple admissions over the last couple of years, 28 total in 2019-2020 -Patient normally wears oxygen via nasal cannula 3 L/min.  Trying to cut down on his smoking but still currently smokes. -Required up to 5 L oxygen via nasal cannula.  Currently on 3 L oxygen. -Incentive spirometry -Counseled about tobacco cessation -Does not feel well today for discharge.  Still feels extremely short of breath.  Continue Solu-Medrol 40 mg IV every 8 hours.  Continue Dulera and duo nebs. -Palliative Care evaluation appreciated. -Overall prognosis is guarded to poor.  Patient should consider hospice.  Diabetes mellitus type 2 with hyperglycemia -Continue metformin as per patient request -Continue CBGs with SSI   Hyperlipidemia--continue statin  Hypertension--continue ACE inhibitor and beta-blocker.  Blood pressure stable.  Bipolar disorder -Continue aripiprazole and escitalopram.  Outpatient follow-up  Generalized deconditioning--PT recommended no PT follow-up    DVT prophylaxis: Lovenox Code Status: Full Family Communication: None at bedside Disposition Plan: Home in 1 to 2 days if clinically improves   Consultants: None  Procedures: None  Antimicrobials: Zithromax from 07/22/2018 onwards   Subjective: Patient seen and examined at bedside.  Does not feel well today, feels extremely short of breath.  Does not feel ready for discharge today.  No overnight fever, nausea or vomiting reported.    Objective: Vitals:   07/23/18 0155 07/23/18 0500 07/23/18 0503 07/23/18 0916  BP:   121/74   Pulse:   84 75  Resp:    16  Temp:   98.7 F (37.1 C)   TempSrc:   Oral   SpO2: 96%  96% 94%  Weight:  65.4 kg    Height:        Intake/Output Summary (Last 24 hours) at 07/23/2018 1036 Last data filed at 07/23/2018 0623 Gross per 24 hour  Intake -  Output 1450 ml  Net -1450 ml   Filed Weights   07/20/18 1154 07/22/18 0500 07/23/18 0500  Weight: 67.1 kg 64.8 kg 65.4 kg    Examination:  General exam: No distress.  Appears anxious.  Looks older than stated age.  Chronically ill.  Has intermittent upper extremity tremors.   Respiratory system: Bilateral decreased breath sounds at bases with scattered crackles and very minimal wheezing Cardiovascular system: Rate controlled, S1 & S2 heard Gastrointestinal system: Abdomen is nondistended, soft and nontender. Normal bowel sounds heard.  Extremities: No cyanosis; trace edema  Data Reviewed: I have personally reviewed following labs and imaging studies  CBC: Recent Labs  Lab 07/20/18 1130  WBC 6.1  NEUTROABS 4.4  HGB 11.1*  HCT 36.2*  MCV 100.6*  PLT 716   Basic Metabolic Panel: Recent Labs  Lab 07/20/18 1130 07/21/18 0547  NA 143  134*  K 3.7 4.5  CL 97* 88*  CO2 39* 39*  GLUCOSE 101* 142*  BUN 5* 15  CREATININE 0.45* 0.60*  CALCIUM 7.2* 8.5*   GFR: Estimated Creatinine Clearance: 87.4 mL/min (A) (by C-G formula based on SCr of 0.6 mg/dL (L)). Liver Function Tests: Recent Labs  Lab 07/20/18 1130  AST 14*  ALT 17  ALKPHOS 40  BILITOT 0.4  PROT 4.3*  ALBUMIN 2.8*   No results for input(s): LIPASE, AMYLASE in the last 168 hours. No results for input(s): AMMONIA in the last 168 hours. Coagulation Profile: Recent Labs  Lab 07/20/18 1130  INR 1.0   Cardiac Enzymes: No results for input(s): CKTOTAL, CKMB, CKMBINDEX, TROPONINI in the last 168 hours. BNP (last 3 results) No results for input(s): PROBNP in the last 8760 hours. HbA1C: No results for input(s): HGBA1C in the last 72 hours. CBG: Recent Labs  Lab 07/22/18 0815 07/22/18 1220 07/22/18 1613 07/22/18 2123 07/23/18 0733  GLUCAP 196* 95 169* 177* 265*   Lipid Profile: No results for input(s): CHOL, HDL, LDLCALC, TRIG, CHOLHDL, LDLDIRECT in the last 72 hours. Thyroid Function Tests: No results for input(s): TSH, T4TOTAL, FREET4, T3FREE, THYROIDAB in the last 72 hours. Anemia Panel: No results for input(s): VITAMINB12, FOLATE, FERRITIN, TIBC, IRON, RETICCTPCT in the last 72 hours. Sepsis Labs: Recent Labs  Lab 07/20/18 1229 07/20/18 1420  LATICACIDVEN 0.7 1.0    Recent Results (from the past 240 hour(s))  Urine culture     Status: None   Collection Time: 07/20/18 12:00 PM   Specimen: Urine, Random  Result Value Ref Range Status   Specimen Description URINE, RANDOM  Final   Special Requests NONE  Final   Culture   Final    NO GROWTH Performed at [facility], [place] [address], [place], [place] [address]    Report Status 07/21/2018 FINAL  Final  Culture, blood (routine x 2)     Status: None (Preliminary result)   Collection Time: 07/20/18 12:00 PM   Specimen: BLOOD RIGHT ARM  Result Value Ref Range Status   Specimen  Description BLOOD RIGHT ARM  Final   Special Requests   Final    BOTTLES DRAWN AEROBIC AND ANAEROBIC Blood Culture adequate volume   Culture   Final    NO GROWTH 3 DAYS Performed at [facility], [place] [address] [place], [place] [address]    Report Status PENDING  Incomplete  SARS Coronavirus 2 (CEPHEID- Performed in [facility]), Hosp Order     Status: None   Collection Time: 07/20/18 12:01 PM   Specimen: Nasopharyngeal Swab  Result Value Ref Range Status   SARS Coronavirus 2 NEGATIVE NEGATIVE Final    Comment: (NOTE) If result is NEGATIVE SARS-CoV-2 target nucleic acids are NOT DETECTED. The SARS-CoV-2 RNA is generally detectable in upper and lower  respiratory specimens during the acute phase of infection. The lowest  concentration of SARS-CoV-2 viral copies this assay can detect is 250  copies / mL. A negative result does not preclude SARS-CoV-2 infection  and should not be used as the sole basis for treatment or  other  patient management decisions.  A negative result may occur with  improper specimen collection / handling, submission of specimen other  than nasopharyngeal swab, presence of viral mutation(s) within the  areas targeted by this assay, and inadequate number of viral copies  (<250 copies / mL). A negative result must be combined with clinical  observations, patient history, and epidemiological information. If result is POSITIVE SARS-CoV-2 target nucleic acids are DETECTED. The SARS-CoV-2 RNA is generally detectable in upper and lower  respiratory specimens dur ing the acute phase of infection.  Positive  results are indicative of active infection with SARS-CoV-2.  Clinical  correlation with patient history and other diagnostic information is  necessary to determine patient infection status.  Positive results do  not rule out bacterial infection or co-infection with other viruses. If result is PRESUMPTIVE POSTIVE SARS-CoV-2 nucleic acids MAY BE  PRESENT.   A presumptive positive result was obtained on the submitted specimen  and confirmed on repeat testing.  While 2019 novel coronavirus  (SARS-CoV-2) nucleic acids may be present in the submitted sample  additional confirmatory testing may be necessary for epidemiological  and / or clinical management purposes  to differentiate between  SARS-CoV-2 and other Sarbecovirus currently known to infect humans.  If clinically indicated additional testing with an alternate test  methodology [id number]) is advised. The SARS-CoV-2 RNA is generally  detectable in upper and lower respiratory sp ecimens during the acute  phase of infection. The expected result is Negative. Fact Sheet for Patients:  StrictlyIdeas.no Fact Sheet for Healthcare Providers: BankingDealers.co.za This test is not yet approved or cleared by the [place] FDA and has been authorized for detection and/or diagnosis of SARS-CoV-2 by FDA under an Emergency Use Authorization (EUA).  This EUA will remain in effect (meaning this test can be used) for the duration of the COVID-19 declaration under Section 564(b)(1) of the Act, 21 U.S.C. section 360bbb-3(b)(1), unless the authorization is terminated or revoked sooner. Performed at [facility], [place] [address], [place], [place] [address]   Culture, blood (routine x 2)     Status: None (Preliminary result)   Collection Time: 07/20/18 12:04 PM   Specimen: BLOOD RIGHT HAND  Result Value Ref Range Status   Specimen Description BLOOD RIGHT HAND  Final   Special Requests   Final    BOTTLES DRAWN AEROBIC AND ANAEROBIC Blood Culture results may not be optimal due to an inadequate volume of blood received in culture bottles   Culture   Final    NO GROWTH 3 DAYS Performed at [facility], [place] [address] [place], [place] [address]    Report Status PENDING  Incomplete         Radiology Studies: No results found.       Scheduled Meds: . ARIPiprazole  5 mg Oral Daily  . azithromycin  500 mg Oral Daily  . carvedilol  3.125 mg Oral BID WC  . enoxaparin (LOVENOX) injection  40 mg Subcutaneous Q24H  . escitalopram  20 mg Oral Daily  . folic acid  1 mg Oral Daily  . insulin aspart  0-5 Units Subcutaneous QHS  . insulin aspart  0-9 Units Subcutaneous TID WC  . ipratropium-albuterol  3 mL Nebulization Q6H  . lisinopril  2.5 mg Oral Daily  . metFORMIN  1,000 mg Oral BID WC  . methylPREDNISolone (SOLU-MEDROL) injection  40 mg Intravenous Q8H  . mometasone-formoterol  2 puff Inhalation BID  . nicotine  7 mg  Transdermal [id number]  . pravastatin  20 mg Oral q1800  . tamsulosin  0.4 mg Oral Daily  . thiamine  100 mg Oral Daily   Continuous Infusions:   LOS: 2 days        [name], MD [facility] 07/23/2018, 10:36 AM

## 2018-07-24 ENCOUNTER — Ambulatory Visit: Payer: Medicaid Other

## 2018-07-24 LAB — BASIC METABOLIC PANEL
Anion gap: 6 (ref 5–15)
BUN: 12 mg/dL (ref 8–23)
CO2: 38 mmol/L — ABNORMAL HIGH (ref 22–32)
Calcium: 8.2 mg/dL — ABNORMAL LOW (ref 8.9–10.3)
Chloride: 89 mmol/L — ABNORMAL LOW (ref 98–111)
Creatinine, Ser: 0.62 mg/dL (ref 0.61–1.24)
GFR calc Af Amer: >60 mL/min (ref >60)
GFR calc non Af Amer: >60 mL/min (ref >60)
Glucose, Bld: 174 mg/dL — ABNORMAL HIGH (ref 70–99)
Potassium: 4.4 mmol/L (ref 3.5–5.1)
Sodium: 133 mmol/L — ABNORMAL LOW (ref 135–145)

## 2018-07-24 LAB — CBC WITH DIFFERENTIAL/PLATELET
Abs Immature Granulocytes: 0.05 10*3/uL (ref 0.00–0.07)
Basophils Absolute: 0 10*3/uL (ref 0.0–0.1)
Basophils Relative: 0 %
Eosinophils Absolute: 0 10*3/uL (ref 0.0–0.5)
Eosinophils Relative: 0 %
HCT: 35 % — ABNORMAL LOW (ref 39.0–52.0)
Hemoglobin: 11.5 g/dL — ABNORMAL LOW (ref 13.0–17.0)
Immature Granulocytes: 1 %
Lymphocytes Relative: 4 %
Lymphs Abs: 0.3 10*3/uL — ABNORMAL LOW (ref 0.7–4.0)
MCH: 30.7 pg (ref 26.0–34.0)
MCHC: 32.9 g/dL (ref 30.0–36.0)
MCV: 93.6 fL (ref 80.0–100.0)
Monocytes Absolute: 0.3 10*3/uL (ref 0.1–1.0)
Monocytes Relative: 4 %
Neutro Abs: 5.7 10*3/uL (ref 1.7–7.7)
Neutrophils Relative %: 91 %
Platelets: 153 10*3/uL (ref 150–400)
RBC: 3.74 MIL/uL — ABNORMAL LOW (ref 4.22–5.81)
RDW: 12.7 % (ref 11.5–15.5)
WBC: 6.3 10*3/uL (ref 4.0–10.5)
nRBC: 0 % (ref 0.0–0.2)

## 2018-07-24 LAB — GLUCOSE, CAPILLARY: Glucose-Capillary: 245 mg/dL — ABNORMAL HIGH (ref 70–99)

## 2018-07-24 LAB — MAGNESIUM: Magnesium: 1.8 mg/dL (ref 1.7–2.4)

## 2018-07-24 MED ORDER — TAMSULOSIN HCL 0.4 MG PO CAPS: 0.4000 mg | ORAL_CAPSULE | Freq: Every day | ORAL | 0 refills | Status: DC

## 2018-07-24 MED ORDER — PREDNISONE 20 MG PO TABS: 40.0000 mg | ORAL_TABLET | Freq: Every day | ORAL | 0 refills | Status: AC

## 2018-07-24 MED ORDER — METFORMIN HCL 1000 MG PO TABS: 1000.0000 mg | ORAL_TABLET | Freq: Two times a day (BID) | ORAL | 0 refills | Status: DC

## 2018-07-24 MED FILL — predniSONE 20 MG TABS: 20 | 7 days supply | Qty: 14 | Fill #0

## 2018-07-24 MED FILL — TAMSULOSIN HCL 0.4 MG CAP: 0.4 | 30 days supply | Qty: 30 | Fill #0

## 2018-07-24 MED FILL — metFORMIN HCL 1000 MG TABS: 1000 | 30 days supply | Qty: 60 | Fill #0

## 2018-07-24 NOTE — Progress Notes (Signed)
[facility] called to schedule transportation.   AVS reviewed with patient and PIV removed.  Patient received meds from pharmacy. No further needs at this time.

## 2018-07-24 NOTE — Progress Notes (Signed)
Patient discharged home via stretcher/[facility]  Oxygen at 4L. belonings sent

## 2018-07-24 NOTE — Discharge Summary (Signed)
Physician Discharge Summary  Taylor [name] HFW:[id number] DOB: 02-27-1955 DOA: 07/20/2018  PCP: [name], NP  Admit date: 07/20/2018 Discharge date: 07/24/2018  Admitted From: Home Disposition: Home  Recommendations for Outpatient Follow-up:  1. Follow up with PCP in 1 week with repeat CBC/BMP 2. Outpatient follow-up with pulmonary 3. Patient will benefit from outpatient evaluation and follow-up by palliative care 4. 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  He was admitted for COPD exacerbation and started on intravenous steroids.  His condition has improved during the hospitalization.  Palliative care has evaluated the patient and recommend outpatient palliative care follow-up.  he will be discharged on oral prednisone.  Discharge Diagnoses:  Principal Problem:   COPD exacerbation ([place]) Active Problems:   COPD  GOLD III   Bipolar 1 disorder (HCC)   Acute on chronic respiratory failure with hypoxia and hypercapnia (HCC)   SOB (shortness of breath)   Prostate cancer (HCC)   Acute on chronic respiratory failure with hypoxia (HCC)   Anxiety and depression   Essential hypertension   Diabetes mellitus type II, non insulin dependent (HCC)   Goals of care, counseling/discussion   Palliative care encounter  COPD exacerbation Acute on chronic hypoxic respiratory failure Tobacco abuse -Has had multiple admissions over the last couple of years, 28 total in 2019-2020 -Patient normally  wears oxygen via nasal cannula 3 L/min.  Trying to cut down on his smoking but still currently smokes. -Required up to 5 L oxygen via nasal cannula.  Currently on 3 L oxygen. -Counseled about tobacco cessation -Treated with intravenous Solu-Medrol.  He feels better and respiratory status has remained stable.  Will discharge on prednisone 40 mg daily for 7 days.  Continue Dulera and neb treatment. -Palliative Care evaluation appreciated.  He will need outpatient palliative care follow-up. -Overall prognosis is guarded to poor.  Patient should consider hospice. -Recommend outpatient pulmonary evaluation.  Diabetes mellitus type 2 with hyperglycemia -Outpatient follow-up.  Continue metformin  Hyperlipidemia--continue statin  Hypertension--continue beta-blocker.  Blood pressure stable.  Patient apparently does not take lisinopril at home.  Bipolar disorder -Continue aripiprazole and escitalopram.  Outpatient follow-up  Generalized deconditioning--PT recommended no PT follow-up   Discharge Instructions  Discharge Instructions    Ambulatory referral to Pulmonology   Complete by: As directed    COPD exacerbation followup   Diet - low sodium heart healthy   Complete by: As directed    Diet Carb Modified   Complete by: As directed    Increase activity slowly   Complete by: As directed      Allergies as of 07/24/2018      Reactions   Codeine Nausea And Vomiting      Medication List    STOP taking these medications   lisinopril 2.5 MG tablet Commonly known as: ZESTRIL     TAKE these medications   Accu-Chek Aviva device Use as instructed   accu-chek soft touch lancets 1 each by Other route 2 (two) times daily. Use as instructed   albuterol (2.5 MG/3ML) 0.083% nebulizer solution Commonly known as: PROVENTIL Take 3 mLs (2.5  mg total) by nebulization every 4 (four) hours as needed for up to 30 days for wheezing or shortness of breath (if you can't catch your breath).    albuterol 108 (90 Base) MCG/ACT inhaler Commonly known as: Proventil HFA Inhale 2 puffs into the lungs every 6 (six) hours as needed for up to 30 days for wheezing or shortness of breath.   ARIPiprazole 5 MG tablet Commonly known as: ABILIFY Take 1 tablet (5 mg total) by mouth daily for 30 days.   carvedilol 3.125 MG tablet Commonly known as: COREG Take 3.125 mg by mouth 2 (two) times daily with a meal.   escitalopram 20 MG tablet Commonly known as: LEXAPRO Take 1 tablet (20 mg total) by mouth daily for 30 days.   folic acid 1 MG tablet Commonly known as: FOLVITE Take 1 tablet (1 mg total) by mouth daily.   glucose blood test strip Commonly known as: Accu-Chek Aviva 1 each by Other route as needed for other. Use as instructed   lovastatin 20 MG tablet Commonly known as: MEVACOR Take 1 tablet (20 mg total) by mouth at bedtime for 30 days.   metFORMIN 1000 MG tablet Commonly known as: GLUCOPHAGE Take 1 tablet (1,000 mg total) by mouth 2 (two) times daily with a meal.   mometasone-formoterol 100-5 MCG/ACT Aero Commonly known as: DULERA Inhale 2 puffs into the lungs 2 (two) times daily for 30 days.   OXYGEN Inhale 3 L into the lungs continuous.   predniSONE 20 MG tablet Commonly known as: Deltasone Take 2 tablets (40 mg total) by mouth daily for 7 days.   tamsulosin 0.4 MG Caps capsule Commonly known as: FLOMAX Take 1 capsule (0.4 mg total) by mouth daily. What changed: how much to take   thiamine 100 MG tablet Take 1 tablet (100 mg total) by mouth daily.   tiotropium 18 MCG inhalation capsule Commonly known as: SPIRIVA Place 1 capsule (18 mcg total) into inhaler and inhale daily.      Follow-up Information    [place]. Go on 08/04/2018.   Why: post hospital follow scheduled for 08/04/2018 at 2:30 pm with Dr. Denita Lung information: [place] [phone]       Palliative  care. Schedule an appointment as soon as possible for a visit in 1 week(s).          Allergies  Allergen Reactions  . Codeine Nausea And Vomiting    Consultations:  Palliative care   Procedures/Studies: Dg Chest 1 View  Result Date: 07/20/2018 CLINICAL DATA:  Shortness of breath EXAM: CHEST  1 VIEW COMPARISON:  06/27/2018 FINDINGS: Cardiac shadows within normal limits. The lungs are hyperinflated without focal infiltrate, effusion or pneumothorax. Old rib fractures are seen on the right. No acute abnormality noted. IMPRESSION: COPD without acute abnormality.  No change from the prior exam. Electronically Signed   By: Inez Catalina M.D.   On: 07/20/2018 12:25   Dg Chest Portable 1 View  Result Date: 06/27/2018 CLINICAL DATA:  63 year old male with shortness of breath EXAM: PORTABLE CHEST 1 VIEW COMPARISON:  Chest radiograph dated 06/12/2018 FINDINGS: There is hyperexpansion of the lungs with flattening of the diaphragms, likely representing [place] ground of air trapping and COPD. There is no focal consolidation, pleural effusion, or pneumothorax. The cardiac silhouette is within normal limits. No acute osseous pathology. IMPRESSION: No active disease. Electronically Signed   By: Anner Crete M.D.   On: 06/27/2018  23:10       Subjective: Patient seen and examined at bedside.  He feels slightly better and thinks that he is ready to go home.  No overnight fever, worsening shortness of breath or vomiting.  Discharge Exam: Vitals:   07/24/18 0811 07/24/18 0812  BP:    Pulse: 92   Resp: 20   Temp:    SpO2: 95% 95%    General: Pt is alert, awake, not in acute distress.  Looks older than stated age.  Looks chronically ill Cardiovascular: rate controlled, S1/S2 + Respiratory: bilateral decreased breath sounds at bases with some scattered wheezing Abdominal: Soft, NT, ND, bowel sounds + Extremities: no edema, no cyanosis    The results of significant diagnostics from this  hospitalization (including imaging, microbiology, ancillary and laboratory) are listed below for reference.     Microbiology: Recent Results (from the past 240 hour(s))  Urine culture     Status: None   Collection Time: 07/20/18 12:00 PM   Specimen: Urine, Random  Result Value Ref Range Status   Specimen Description URINE, RANDOM  Final   Special Requests NONE  Final   Culture   Final    NO GROWTH Performed at [facility], [address] [place], [place] [address]    Report Status 07/21/2018 FINAL  Final  Culture, blood (routine x 2)     Status: None (Preliminary result)   Collection Time: 07/20/18 12:00 PM   Specimen: BLOOD RIGHT ARM  Result Value Ref Range Status   Specimen Description BLOOD RIGHT ARM  Final   Special Requests   Final    BOTTLES DRAWN AEROBIC AND ANAEROBIC Blood Culture adequate volume   Culture   Final    NO GROWTH 4 DAYS Performed at [facility], [place] [address] [place], [place] [address]    Report Status PENDING  Incomplete  SARS Coronavirus 2 (CEPHEID- Performed in [place] hospital lab), Hosp Order     Status: None   Collection Time: 07/20/18 12:01 PM   Specimen: Nasopharyngeal Swab  Result Value Ref Range Status   SARS Coronavirus 2 NEGATIVE NEGATIVE Final    Comment: (NOTE) If result is NEGATIVE SARS-CoV-2 target nucleic acids are NOT DETECTED. The SARS-CoV-2 RNA is generally detectable in upper and lower  respiratory specimens during the acute phase of infection. The lowest  concentration of SARS-CoV-2 viral copies this assay can detect is 250  copies / mL. A negative result does not preclude SARS-CoV-2 infection  and should not be used as the sole basis for treatment or other  patient management decisions.  A negative result may occur with  improper specimen collection / handling, submission of specimen other  than nasopharyngeal swab, presence of viral mutation(s) within the  areas targeted by this assay, and inadequate number  of viral copies  (<250 copies / mL). A negative result must be combined with clinical  observations, patient history, and epidemiological information. If result is POSITIVE SARS-CoV-2 target nucleic acids are DETECTED. The SARS-CoV-2 RNA is generally detectable in upper and lower  respiratory specimens dur ing the acute phase of infection.  Positive  results are indicative of active infection with SARS-CoV-2.  Clinical  correlation with patient history and other diagnostic information is  necessary to determine patient infection status.  Positive results do  not rule out bacterial infection or co-infection with other viruses. If result is PRESUMPTIVE POSTIVE SARS-CoV-2 nucleic acids MAY BE PRESENT.   A presumptive positive result was  obtained on the submitted specimen  and confirmed on repeat testing.  While 2019 novel coronavirus  (SARS-CoV-2) nucleic acids may be present in the submitted sample  additional confirmatory testing may be necessary for epidemiological  and / or clinical management purposes  to differentiate between  SARS-CoV-2 and other Sarbecovirus currently known to infect humans.  If clinically indicated additional testing with an alternate test  methodology [phone]) is advised. The SARS-CoV-2 RNA is generally  detectable in upper and lower respiratory sp ecimens during the acute  phase of infection. The expected result is Negative. Fact Sheet for Patients:  StrictlyIdeas.no Fact Sheet for Healthcare Providers: BankingDealers.co.za This test is not yet approved or cleared by the [place] FDA and has been authorized for detection and/or diagnosis of SARS-CoV-2 by FDA under an Emergency Use Authorization (EUA).  This EUA will remain in effect (meaning this test can be used) for the duration of the COVID-19 declaration under Section 564(b)(1) of the Act, 21 U.S.C. section 360bbb-3(b)(1), unless the authorization is  terminated or revoked sooner. Performed at [facility], [place] [address], [place], [place] [address]   Culture, blood (routine x 2)     Status: None (Preliminary result)   Collection Time: 07/20/18 12:04 PM   Specimen: BLOOD RIGHT HAND  Result Value Ref Range Status   Specimen Description BLOOD RIGHT HAND  Final   Special Requests   Final    BOTTLES DRAWN AEROBIC AND ANAEROBIC Blood Culture results may not be optimal due to an inadequate volume of blood received in culture bottles   Culture   Final    NO GROWTH 4 DAYS Performed at [facility], [place] [address], [place], [place] [address]    Report Status PENDING  Incomplete     Labs: BNP (last 3 results) Recent Labs    05/01/18 2019 05/25/18 2021 07/20/18 1130  BNP 45.6 81.3 53.6   Basic Metabolic Panel: Recent Labs  Lab 07/20/18 1130 07/21/18 0547 07/24/18 0302  NA 143 134* 133*  K 3.7 4.5 4.4  CL 97* 88* 89*  CO2 39* 39* 38*  GLUCOSE 101* 142* 174*  BUN 5* 15 12  CREATININE 0.45* 0.60* 0.62  CALCIUM 7.2* 8.5* 8.2*  MG  --   --  1.8   Liver Function Tests: Recent Labs  Lab 07/20/18 1130  AST 14*  ALT 17  ALKPHOS 40  BILITOT 0.4  PROT 4.3*  ALBUMIN 2.8*   No results for input(s): LIPASE, AMYLASE in the last 168 hours. No results for input(s): AMMONIA in the last 168 hours. CBC: Recent Labs  Lab 07/20/18 1130 07/24/18 0302  WBC 6.1 6.3  NEUTROABS 4.4 5.7  HGB 11.1* 11.5*  HCT 36.2* 35.0*  MCV 100.6* 93.6  PLT 153 153   Cardiac Enzymes: No results for input(s): CKTOTAL, CKMB, CKMBINDEX, TROPONINI in the last 168 hours. BNP: Invalid input(s): POCBNP CBG: Recent Labs  Lab 07/23/18 0733 07/23/18 1148 07/23/18 1703 07/23/18 2211 07/24/18 0810  GLUCAP 265* 127* 131* 174* 245*   D-Dimer No results for input(s): DDIMER in the last 72 hours. Hgb A1c No results for input(s): HGBA1C in the last 72 hours. Lipid Profile No results for input(s): CHOL, HDL, LDLCALC, TRIG, CHOLHDL,  LDLDIRECT in the last 72 hours. Thyroid function studies No results for input(s): TSH, T4TOTAL, T3FREE, THYROIDAB in the last 72 hours.  Invalid input(s): FREET3 Anemia work up No results for input(s): VITAMINB12, FOLATE, FERRITIN, TIBC, IRON, RETICCTPCT in the last 58  hours. Urinalysis    Component Value Date/Time   COLORURINE YELLOW 07/20/2018 1330   APPEARANCEUR CLEAR 07/20/2018 1330   LABSPEC 1.008 07/20/2018 1330   PHURINE 6.0 07/20/2018 1330   GLUCOSEU NEGATIVE 07/20/2018 1330   HGBUR NEGATIVE 07/20/2018 1330   BILIRUBINUR NEGATIVE 07/20/2018 1330   KETONESUR NEGATIVE 07/20/2018 1330   PROTEINUR NEGATIVE 07/20/2018 1330   UROBILINOGEN 1.0 01/09/2013 1843   NITRITE NEGATIVE 07/20/2018 1330   LEUKOCYTESUR NEGATIVE 07/20/2018 1330   Sepsis Labs Invalid input(s): PROCALCITONIN,  WBC,  LACTICIDVEN Microbiology Recent Results (from the past 240 hour(s))  Urine culture     Status: None   Collection Time: 07/20/18 12:00 PM   Specimen: Urine, Random  Result Value Ref Range Status   Specimen Description URINE, RANDOM  Final   Special Requests NONE  Final   Culture   Final    NO GROWTH Performed at [facility], [place] [address] [place], [place] [address]    Report Status 07/21/2018 FINAL  Final  Culture, blood (routine x 2)     Status: None (Preliminary result)   Collection Time: 07/20/18 12:00 PM   Specimen: BLOOD RIGHT ARM  Result Value Ref Range Status   Specimen Description BLOOD RIGHT ARM  Final   Special Requests   Final    BOTTLES DRAWN AEROBIC AND ANAEROBIC Blood Culture adequate volume   Culture   Final    NO GROWTH 4 DAYS Performed at [facility], [place] [address], [place],  [address]    Report Status PENDING  Incomplete  SARS Coronavirus 2 (CEPHEID- Performed in [place] hospital lab), Hosp Order     Status: None   Collection Time: 07/20/18 12:01 PM   Specimen: Nasopharyngeal Swab  Result Value Ref Range Status   SARS Coronavirus 2  NEGATIVE NEGATIVE Final    Comment: (NOTE) If result is NEGATIVE SARS-CoV-2 target nucleic acids are NOT DETECTED. The SARS-CoV-2 RNA is generally detectable in upper and lower  respiratory specimens during the acute phase of infection. The lowest  concentration of SARS-CoV-2 viral copies this assay can detect is 250  copies / mL. A negative result does not preclude SARS-CoV-2 infection  and should not be used as the sole basis for treatment or other  patient management decisions.  A negative result may occur with  improper specimen collection / handling, submission of specimen other  than nasopharyngeal swab, presence of viral mutation(s) within the  areas targeted by this assay, and inadequate number of viral copies  (<250 copies / mL). A negative result must be combined with clinical  observations, patient history, and epidemiological information. If result is POSITIVE SARS-CoV-2 target nucleic acids are DETECTED. The SARS-CoV-2 RNA is generally detectable in upper and lower  respiratory specimens dur ing the acute phase of infection.  Positive  results are indicative of active infection with SARS-CoV-2.  Clinical  correlation with patient history and other diagnostic information is  necessary to determine patient infection status.  Positive results do  not rule out bacterial infection or co-infection with other viruses. If result is PRESUMPTIVE POSTIVE SARS-CoV-2 nucleic acids MAY BE PRESENT.   A presumptive positive result was obtained on the submitted specimen  and confirmed on repeat testing.  While 2019 novel coronavirus  (SARS-CoV-2) nucleic acids may be present in the submitted sample  additional confirmatory testing may be necessary for epidemiological  and / or clinical management purposes  to differentiate between  SARS-CoV-2 and other Sarbecovirus currently known  to infect humans.  If clinically indicated additional testing with an alternate test  methodology [phone])  is advised. The SARS-CoV-2 RNA is generally  detectable in upper and lower respiratory sp ecimens during the acute  phase of infection. The expected result is Negative. Fact Sheet for Patients:  StrictlyIdeas.no Fact Sheet for Healthcare Providers: BankingDealers.co.za This test is not yet approved or cleared by the [place] FDA and has been authorized for detection and/or diagnosis of SARS-CoV-2 by FDA under an Emergency Use Authorization (EUA).  This EUA will remain in effect (meaning this test can be used) for the duration of the COVID-19 declaration under Section 564(b)(1) of the Act, 21 U.S.C. section 360bbb-3(b)(1), unless the authorization is terminated or revoked sooner. Performed at [facility], [place] [address] [place], [place] [address]   Culture, blood (routine x 2)     Status: None (Preliminary result)   Collection Time: 07/20/18 12:04 PM   Specimen: BLOOD RIGHT HAND  Result Value Ref Range Status   Specimen Description BLOOD RIGHT HAND  Final   Special Requests   Final    BOTTLES DRAWN AEROBIC AND ANAEROBIC Blood Culture results may not be optimal due to an inadequate volume of blood received in culture bottles   Culture   Final    NO GROWTH 4 DAYS Performed at [facility], [place] [address], [place],  [address]    Report Status PENDING  Incomplete     Time coordinating discharge: 35 minutes  SIGNED:   [name], MD  [facility] 07/24/2018, 9:17 AM

## 2018-07-24 NOTE — TOC Transition Note (Signed)
Transition of Care [facility]) - CM/SW Discharge Note   Patient Details  Name: Taylor [name] MRN: [id number] Date of Birth: 1955-01-31  [facility] of [facility]) CM/SW Contact:  Sharin Mons, RN Phone Number: 07/24/2018, 9:52 AM   Clinical Narrative:    Admitted with COPD exacerbation. Transition to home today. Declined home health services. States neighbor to assist with care if needed once d/c. Agreeable to outpatient palliative care. Referral made with Manufacturing engineer. [facility] to provide Rx meds to bedsides prior to d/c.  [facility] to provide transportation to home.   Final next level of care: Home/Self Care Barriers to Discharge: No Barriers Identified   Patient Goals and CMS Choice Patient states their goals for this hospitalization and ongoing recovery are:: to get better and go home   Choice offered to / list presented to : NA  Discharge Placement                       Discharge Plan and Services In-house Referral: NA Discharge Planning Services: CM Consult            DME Arranged: N/A DME Agency: NA       HH Arranged: NA(Pt declined  home health services) [place] Agency: NA        Social Determinants of Health (SDOH) Interventions     Readmission Risk Interventions Readmission Risk Prevention Plan 06/30/2018 05/27/2018 05/03/2018  Transportation Screening Complete Complete -  Medication Review Press photographer) Complete Complete -  PCP or Specialist appointment within 3-5 days of discharge Complete Complete -  [place] or Home Care Consult Complete - Patient refused  SW Recovery Care/Counseling Consult Complete Not Complete -  SW Consult Not Complete Comments - - -  Palliative Care Screening Not Applicable Not Applicable -  [place] Not Applicable Not Applicable -  Some recent data might be hidden

## 2018-07-25 LAB — CULTURE, BLOOD (ROUTINE X 2)
Culture: NO GROWTH
Culture: NO GROWTH
Special Requests: ADEQUATE

## 2018-07-27 ENCOUNTER — Telehealth: Payer: Self-pay | Admitting: Radiation Oncology

## 2018-07-27 ENCOUNTER — Telehealth: Payer: Self-pay

## 2018-07-27 ENCOUNTER — Ambulatory Visit: Payer: Medicaid Other

## 2018-07-27 NOTE — Telephone Encounter (Signed)
Phone call placed to introduce Palliative Care and to offer to schedule visit with NP. No answer and voicemailbox not set up

## 2018-07-27 NOTE — Telephone Encounter (Signed)
Phoned patient to inquire about his intentions reference radiation therapy. No answer. Unable to leave voicemail message since mailbox hasn't been set up yet.   Since several unsuccessful attempts have been made to contact this patient I will request the administrative staff mail a letter.

## 2018-07-28 ENCOUNTER — Telehealth: Payer: Self-pay | Admitting: Nurse Practitioner

## 2018-07-28 ENCOUNTER — Ambulatory Visit: Payer: Medicaid Other

## 2018-07-28 ENCOUNTER — Telehealth: Payer: Self-pay | Admitting: *Deleted

## 2018-07-28 NOTE — Telephone Encounter (Signed)
Called patient's sister to see if there was another number to reach patient at, but she didn't answer.  Left message with reason for call along with my name and contact information.

## 2018-07-28 NOTE — Telephone Encounter (Signed)
Called Carrolyn Meiers, patient' friend listed in contacts and he stated that he hasn't talked with patient in the past 2-3 months and doesn't know what's going on with him.  I thank him for the information and apologized for interrupting his day.

## 2018-07-28 NOTE — Telephone Encounter (Signed)
Contacted patient again in attempt to schedule Palliative Consult, no answer and no voicemail set up.

## 2018-07-28 NOTE — Telephone Encounter (Signed)
Mailed letter to patient to get in touch with radiation oncology to schedule an appointment.

## 2018-07-29 ENCOUNTER — Ambulatory Visit: Payer: Medicaid Other

## 2018-07-30 ENCOUNTER — Ambulatory Visit: Payer: Medicaid Other

## 2018-07-31 ENCOUNTER — Ambulatory Visit: Payer: Medicaid Other

## 2018-08-03 ENCOUNTER — Ambulatory Visit: Payer: Medicaid Other

## 2018-08-04 ENCOUNTER — Ambulatory Visit: Payer: Medicaid Other

## 2018-08-04 ENCOUNTER — Ambulatory Visit: Payer: Medicaid Other | Admitting: Family Medicine

## 2018-08-04 NOTE — Progress Notes (Signed)
  Radiation Oncology         [phone]) [phone] ________________________________  Name: Taylor [name] MRN: [id number]  Date: 07/22/2018  DOB: 01-27-1955  End of Treatment Note  Diagnosis:   63 y.o. male with Stage T2/3 adenocarcinoma of the prostate with Gleason Score of 4+3, and PSA of 29.8     Indication for treatment:  Curative, Definitive Radiotherapy       Radiation treatment dates:   03/17/2018 - 04/16/2018  Site/dose:  1. The prostate, seminal vesicles, and pelvic lymph nodes were planned to receive 45 Gy in 25 fractions of 1.8 Gy. The patient only received 18.0 Gy total delivered in 10 fractions. 2. The prostate only was then planned to be boosted to 75 Gy with 15 additional fractions of 2.0 Gy   Beams/energy:  1. The prostate, seminal vesicles, and pelvic lymph nodes were initially treated using VMAT intensity modulated radiotherapy delivering 6 megavolt photons. Image guidance was performed with CB-CT studies prior to each fraction. He was immobilized with a body fix lower extremity mold.   Narrative: The patient tolerated radiation treatment relatively well.   The patient experienced some minor urinary irritation with urgency and nocturia x5-6. He also noted diarrhea. After March 26th, the patient stopped showing for radiation treatments and would not return our calls to resume his care.   Plan: The patient has discontinued radiation treatment despite multiple attempts by our department to reach him and encourage continuation of treatment. He will return to radiation oncology clinic as needed. ________________________________  Sheral Apley Tammi Klippel, M.D.  This document serves as a record of services personally performed by Tyler Pita, MD. It was created on his behalf by Rae Lips, a trained medical scribe. The creation of this record is based on the scribe's personal observations and the provider's statements to them. This document has been checked and approved by the  attending provider.

## 2018-08-05 ENCOUNTER — Ambulatory Visit: Payer: Medicaid Other

## 2018-08-06 ENCOUNTER — Telehealth: Payer: Self-pay | Admitting: Internal Medicine

## 2018-08-06 ENCOUNTER — Ambulatory Visit: Payer: Medicaid Other

## 2018-08-06 MED ORDER — ALBUTEROL SULFATE (2.5 MG/3ML) 0.083% IN NEBU: 2.5000 mg | INHALATION_SOLUTION | RESPIRATORY_TRACT | 11 refills | Status: AC | PRN

## 2018-08-06 MED ORDER — ALBUTEROL SULFATE HFA 108 (90 BASE) MCG/ACT IN AERS: 2.0000 | INHALATION_SPRAY | Freq: Four times a day (QID) | RESPIRATORY_TRACT | 5 refills | Status: AC | PRN

## 2018-08-06 MED ORDER — MOMETASONE FURO-FORMOTEROL FUM 100-5 MCG/ACT IN AERO: 2.0000 | INHALATION_SPRAY | Freq: Two times a day (BID) | RESPIRATORY_TRACT | 5 refills | Status: AC

## 2018-08-06 MED ORDER — TIOTROPIUM BROMIDE MONOHYDRATE 18 MCG IN CAPS: 18.0000 ug | ORAL_CAPSULE | Freq: Every day | RESPIRATORY_TRACT | 5 refills | Status: AC

## 2018-08-06 NOTE — Telephone Encounter (Signed)
Called and spoke with pt letting him know that I sent all meds to pharmacy for him. Stated to pt that we needed to schedule an appt for him with MW and this visit could be a televisit and pt verbalized understanding. Pt stated that he is pretty much bedridden at this time due to recent hospital stays and his health. Pt has been scheduled for televisit with MW Mon. 7/20 at 9:30. Nothing further needed.

## 2018-08-07 ENCOUNTER — Ambulatory Visit: Payer: Medicaid Other

## 2018-08-10 ENCOUNTER — Inpatient Hospital Stay (HOSPITAL_COMMUNITY)
Admission: EM | Admit: 2018-08-10 | Discharge: 2018-08-13 | DRG: 190 | Disposition: A | Discharge status: Hospice | Payer: Medicaid Other | Attending: Internal Medicine | Admitting: Internal Medicine

## 2018-08-10 ENCOUNTER — Ambulatory Visit: Payer: Medicaid Other

## 2018-08-10 ENCOUNTER — Encounter: Payer: Self-pay | Admitting: Internal Medicine

## 2018-08-10 ENCOUNTER — Emergency Department (HOSPITAL_COMMUNITY): Payer: Medicaid Other

## 2018-08-10 ENCOUNTER — Encounter (HOSPITAL_COMMUNITY): Payer: Self-pay

## 2018-08-10 ENCOUNTER — Other Ambulatory Visit: Payer: Self-pay

## 2018-08-10 ENCOUNTER — Ambulatory Visit (INDEPENDENT_AMBULATORY_CARE_PROVIDER_SITE_OTHER): Payer: Medicaid Other | Admitting: Internal Medicine

## 2018-08-10 DIAGNOSIS — Z20828 Contact with and (suspected) exposure to other viral communicable diseases: Secondary | ICD-10-CM | POA: Diagnosis present

## 2018-08-10 DIAGNOSIS — J9622 Acute and chronic respiratory failure with hypercapnia: Secondary | ICD-10-CM | POA: Diagnosis present

## 2018-08-10 DIAGNOSIS — Z885 Allergy status to narcotic agent status: Secondary | ICD-10-CM

## 2018-08-10 DIAGNOSIS — K5900 Constipation, unspecified: Secondary | ICD-10-CM

## 2018-08-10 DIAGNOSIS — Z599 Problem related to housing and economic circumstances, unspecified: Secondary | ICD-10-CM

## 2018-08-10 DIAGNOSIS — J9612 Chronic respiratory failure with hypercapnia: Secondary | ICD-10-CM | POA: Diagnosis not present

## 2018-08-10 DIAGNOSIS — I10 Essential (primary) hypertension: Secondary | ICD-10-CM | POA: Diagnosis present

## 2018-08-10 DIAGNOSIS — E11649 Type 2 diabetes mellitus with hypoglycemia without coma: Secondary | ICD-10-CM | POA: Diagnosis present

## 2018-08-10 DIAGNOSIS — Z85038 Personal history of other malignant neoplasm of large intestine: Secondary | ICD-10-CM

## 2018-08-10 DIAGNOSIS — Z760 Encounter for issue of repeat prescription: Secondary | ICD-10-CM

## 2018-08-10 DIAGNOSIS — J449 Chronic obstructive pulmonary disease, unspecified: Secondary | ICD-10-CM

## 2018-08-10 DIAGNOSIS — E1165 Type 2 diabetes mellitus with hyperglycemia: Secondary | ICD-10-CM | POA: Diagnosis present

## 2018-08-10 DIAGNOSIS — Z87891 Personal history of nicotine dependence: Secondary | ICD-10-CM

## 2018-08-10 DIAGNOSIS — Z7951 Long term (current) use of inhaled steroids: Secondary | ICD-10-CM

## 2018-08-10 DIAGNOSIS — R531 Weakness: Secondary | ICD-10-CM

## 2018-08-10 DIAGNOSIS — Z9981 Dependence on supplemental oxygen: Secondary | ICD-10-CM

## 2018-08-10 DIAGNOSIS — J441 Chronic obstructive pulmonary disease with (acute) exacerbation: Principal | ICD-10-CM | POA: Diagnosis present

## 2018-08-10 DIAGNOSIS — J9621 Acute and chronic respiratory failure with hypoxia: Secondary | ICD-10-CM | POA: Diagnosis present

## 2018-08-10 DIAGNOSIS — R0600 Dyspnea, unspecified: Secondary | ICD-10-CM

## 2018-08-10 DIAGNOSIS — J9611 Chronic respiratory failure with hypoxia: Secondary | ICD-10-CM | POA: Diagnosis not present

## 2018-08-10 DIAGNOSIS — Z8249 Family history of ischemic heart disease and other diseases of the circulatory system: Secondary | ICD-10-CM

## 2018-08-10 DIAGNOSIS — Z8546 Personal history of malignant neoplasm of prostate: Secondary | ICD-10-CM

## 2018-08-10 DIAGNOSIS — F319 Bipolar disorder, unspecified: Secondary | ICD-10-CM | POA: Diagnosis present

## 2018-08-10 DIAGNOSIS — E119 Type 2 diabetes mellitus without complications: Secondary | ICD-10-CM

## 2018-08-10 DIAGNOSIS — E871 Hypo-osmolality and hyponatremia: Secondary | ICD-10-CM | POA: Diagnosis not present

## 2018-08-10 DIAGNOSIS — E162 Hypoglycemia, unspecified: Secondary | ICD-10-CM

## 2018-08-10 DIAGNOSIS — Z7984 Long term (current) use of oral hypoglycemic drugs: Secondary | ICD-10-CM

## 2018-08-10 DIAGNOSIS — Z79899 Other long term (current) drug therapy: Secondary | ICD-10-CM

## 2018-08-10 DIAGNOSIS — J440 Chronic obstructive pulmonary disease with acute lower respiratory infection: Secondary | ICD-10-CM | POA: Diagnosis present

## 2018-08-10 DIAGNOSIS — J209 Acute bronchitis, unspecified: Secondary | ICD-10-CM | POA: Diagnosis present

## 2018-08-10 DIAGNOSIS — Z801 Family history of malignant neoplasm of trachea, bronchus and lung: Secondary | ICD-10-CM

## 2018-08-10 DIAGNOSIS — Z66 Do not resuscitate: Secondary | ICD-10-CM | POA: Diagnosis present

## 2018-08-10 LAB — BASIC METABOLIC PANEL
Anion gap: 14 (ref 5–15)
BUN: 13 mg/dL (ref 8–23)
CO2: 31 mmol/L (ref 22–32)
Calcium: 7.8 mg/dL — ABNORMAL LOW (ref 8.9–10.3)
Chloride: 77 mmol/L — ABNORMAL LOW (ref 98–111)
Creatinine, Ser: 0.42 mg/dL — ABNORMAL LOW (ref 0.61–1.24)
GFR calc Af Amer: >60 mL/min (ref >60)
GFR calc non Af Amer: >60 mL/min (ref >60)
Glucose, Bld: 65 mg/dL — ABNORMAL LOW (ref 70–99)
Potassium: 4.4 mmol/L (ref 3.5–5.1)
Sodium: 122 mmol/L — ABNORMAL LOW (ref 135–145)

## 2018-08-10 LAB — CBC
HCT: 38.1 % — ABNORMAL LOW (ref 39.0–52.0)
Hemoglobin: 12.6 g/dL — ABNORMAL LOW (ref 13.0–17.0)
MCH: 30.5 pg (ref 26.0–34.0)
MCHC: 33.1 g/dL (ref 30.0–36.0)
MCV: 92.3 fL (ref 80.0–100.0)
Platelets: 192 10*3/uL (ref 150–400)
RBC: 4.13 MIL/uL — ABNORMAL LOW (ref 4.22–5.81)
RDW: 12.6 % (ref 11.5–15.5)
WBC: 5.3 10*3/uL (ref 4.0–10.5)
nRBC: 0 % (ref 0.0–0.2)

## 2018-08-10 LAB — GLUCOSE, CAPILLARY: Glucose-Capillary: 159 mg/dL — ABNORMAL HIGH (ref 70–99)

## 2018-08-10 LAB — ETHANOL: Alcohol, Ethyl (B): <10 mg/dL (ref <10)

## 2018-08-10 LAB — TSH: TSH: 0.424 u[IU]/mL (ref 0.350–4.500)

## 2018-08-10 LAB — SARS CORONAVIRUS 2 BY RT PCR (HOSPITAL ORDER, PERFORMED IN ~~LOC~~ HOSPITAL LAB): SARS Coronavirus 2: NEGATIVE

## 2018-08-10 LAB — CBG MONITORING, ED
Glucose-Capillary: 73 mg/dL (ref 70–99)
Glucose-Capillary: 117 mg/dL — ABNORMAL HIGH (ref 70–99)

## 2018-08-10 LAB — OSMOLALITY: Osmolality: 256 mOsm/kg — ABNORMAL LOW (ref 275–295)

## 2018-08-10 MED ORDER — TAMSULOSIN HCL 0.4 MG PO CAPS
0.4000 mg | ORAL_CAPSULE | Freq: Every day | ORAL | Status: DC
  Administered 2018-08-10 – 2018-08-13 (×4): 0.4 mg via ORAL
  Filled 2018-08-10 (×4): qty 1

## 2018-08-10 MED ORDER — VITAMIN B-1 100 MG PO TABS
100.0000 mg | ORAL_TABLET | Freq: Every day | ORAL | Status: DC
  Administered 2018-08-10 – 2018-08-13 (×4): 100 mg via ORAL
  Filled 2018-08-10 (×4): qty 1

## 2018-08-10 MED ORDER — ALBUTEROL SULFATE HFA 108 (90 BASE) MCG/ACT IN AERS
6.0000 | INHALATION_SPRAY | Freq: Once | RESPIRATORY_TRACT | Status: AC
  Administered 2018-08-10: 6 via RESPIRATORY_TRACT
  Filled 2018-08-10: qty 6.7

## 2018-08-10 MED ORDER — IPRATROPIUM-ALBUTEROL 0.5-2.5 (3) MG/3ML IN SOLN
3.0000 mL | Freq: Four times a day (QID) | RESPIRATORY_TRACT | Status: DC
  Administered 2018-08-10: 3 mL via RESPIRATORY_TRACT
  Filled 2018-08-10 (×2): qty 3

## 2018-08-10 MED ORDER — PRAVASTATIN SODIUM 20 MG PO TABS
40.0000 mg | ORAL_TABLET | Freq: Every day | ORAL | Status: DC
  Administered 2018-08-11 – 2018-08-12 (×2): 40 mg via ORAL
  Filled 2018-08-10 (×2): qty 2

## 2018-08-10 MED ORDER — METHYLPREDNISOLONE SODIUM SUCC 125 MG IJ SOLR
60.0000 mg | Freq: Four times a day (QID) | INTRAMUSCULAR | Status: DC
  Administered 2018-08-10 – 2018-08-13 (×11): 60 mg via INTRAVENOUS
  Filled 2018-08-10 (×11): qty 2

## 2018-08-10 MED ORDER — METHYLPREDNISOLONE SODIUM SUCC 125 MG IJ SOLR
INTRAMUSCULAR | Status: AC
  Filled 2018-08-10: qty 2

## 2018-08-10 MED ORDER — ONDANSETRON HCL 4 MG/2ML IJ SOLN
4.0000 mg | Freq: Once | INTRAMUSCULAR | Status: AC
  Administered 2018-08-10: 4 mg via INTRAVENOUS
  Filled 2018-08-10: qty 2

## 2018-08-10 MED ORDER — INSULIN ASPART 100 UNIT/ML ~~LOC~~ SOLN
0.0000 [IU] | Freq: Every day | SUBCUTANEOUS | Status: DC
  Filled 2018-08-10: qty 0.05

## 2018-08-10 MED ORDER — ACETAMINOPHEN 325 MG PO TABS: 650.0000 mg | ORAL_TABLET | ORAL | Status: DC | PRN

## 2018-08-10 MED ORDER — METHYLPREDNISOLONE SODIUM SUCC 125 MG IJ SOLR
125.0000 mg | Freq: Once | INTRAMUSCULAR | Status: AC
  Administered 2018-08-10: 125 mg via INTRAVENOUS
  Filled 2018-08-10: qty 2

## 2018-08-10 MED ORDER — MOMETASONE FURO-FORMOTEROL FUM 100-5 MCG/ACT IN AERO
2.0000 | INHALATION_SPRAY | Freq: Two times a day (BID) | RESPIRATORY_TRACT | Status: DC
  Administered 2018-08-10 – 2018-08-13 (×6): 2 via RESPIRATORY_TRACT
  Filled 2018-08-10: qty 8.8

## 2018-08-10 MED ORDER — ALBUTEROL SULFATE (2.5 MG/3ML) 0.083% IN NEBU
2.5000 mg | INHALATION_SOLUTION | RESPIRATORY_TRACT | Status: DC | PRN
  Administered 2018-08-12 – 2018-08-13 (×3): 2.5 mg via RESPIRATORY_TRACT
  Filled 2018-08-10 (×3): qty 3

## 2018-08-10 MED ORDER — ARIPIPRAZOLE 5 MG PO TABS
5.0000 mg | ORAL_TABLET | Freq: Every day | ORAL | Status: DC
  Administered 2018-08-10 – 2018-08-13 (×4): 5 mg via ORAL
  Filled 2018-08-10 (×4): qty 1

## 2018-08-10 MED ORDER — ESCITALOPRAM OXALATE 10 MG PO TABS
20.0000 mg | ORAL_TABLET | Freq: Every day | ORAL | Status: DC
  Administered 2018-08-10 – 2018-08-13 (×4): 20 mg via ORAL
  Filled 2018-08-10 (×4): qty 2

## 2018-08-10 MED ORDER — FOLIC ACID 1 MG PO TABS
1.0000 mg | ORAL_TABLET | Freq: Every day | ORAL | Status: DC
  Administered 2018-08-10 – 2018-08-13 (×4): 1 mg via ORAL
  Filled 2018-08-10 (×4): qty 1

## 2018-08-10 MED ORDER — IPRATROPIUM BROMIDE HFA 17 MCG/ACT IN AERS
2.0000 | INHALATION_SPRAY | Freq: Once | RESPIRATORY_TRACT | Status: AC
  Administered 2018-08-10: 2 via RESPIRATORY_TRACT
  Filled 2018-08-10: qty 12.9

## 2018-08-10 MED ORDER — IPRATROPIUM-ALBUTEROL 0.5-2.5 (3) MG/3ML IN SOLN
3.0000 mL | Freq: Three times a day (TID) | RESPIRATORY_TRACT | Status: DC
  Administered 2018-08-10 – 2018-08-13 (×8): 3 mL via RESPIRATORY_TRACT
  Filled 2018-08-10 (×9): qty 3

## 2018-08-10 MED ORDER — ONDANSETRON HCL 4 MG/2ML IJ SOLN: 4.0000 mg | Freq: Four times a day (QID) | INTRAMUSCULAR | Status: DC | PRN

## 2018-08-10 MED ORDER — ENOXAPARIN SODIUM 40 MG/0.4ML ~~LOC~~ SOLN
40.0000 mg | SUBCUTANEOUS | Status: DC
  Administered 2018-08-10 – 2018-08-12 (×3): 40 mg via SUBCUTANEOUS
  Filled 2018-08-10 (×3): qty 0.4

## 2018-08-10 MED ORDER — KETOROLAC TROMETHAMINE 15 MG/ML IJ SOLN
15.0000 mg | Freq: Once | INTRAMUSCULAR | Status: AC
  Administered 2018-08-10: 15 mg via INTRAVENOUS
  Filled 2018-08-10: qty 1

## 2018-08-10 MED ORDER — SODIUM CHLORIDE 0.9 % IV SOLN
INTRAVENOUS | Status: DC
  Administered 2018-08-10 – 2018-08-11 (×3): via INTRAVENOUS

## 2018-08-10 MED ORDER — SODIUM CHLORIDE 0.9 % IV SOLN
500.0000 mg | INTRAVENOUS | Status: DC
  Administered 2018-08-10 – 2018-08-12 (×3): 500 mg via INTRAVENOUS
  Filled 2018-08-10 (×3): qty 500

## 2018-08-10 MED ORDER — INSULIN ASPART 100 UNIT/ML ~~LOC~~ SOLN
0.0000 [IU] | Freq: Three times a day (TID) | SUBCUTANEOUS | Status: DC
  Administered 2018-08-11 (×2): 2 [IU] via SUBCUTANEOUS
  Administered 2018-08-11: 5 [IU] via SUBCUTANEOUS
  Administered 2018-08-12: 3 [IU] via SUBCUTANEOUS
  Administered 2018-08-12 – 2018-08-13 (×3): 2 [IU] via SUBCUTANEOUS
  Filled 2018-08-10: qty 0.09

## 2018-08-10 MED ORDER — SODIUM CHLORIDE 0.9 % IV BOLUS
1000.0000 mL | Freq: Once | INTRAVENOUS | Status: AC
  Administered 2018-08-10: 1000 mL via INTRAVENOUS

## 2018-08-10 NOTE — ED Notes (Signed)
Provided pt with urinal

## 2018-08-10 NOTE — ED Notes (Signed)
Secretory made aware of scanner not working in 21.

## 2018-08-10 NOTE — Assessment & Plan Note (Signed)
HCO3  12/21/17  = 35  HC03   01/19/18 = 26  - 02/10/2018   Walked on 3lpm cont 3 laps @  approx 234ft each @ slow pace  Stopped p each lap  due to  Sob but no desats

## 2018-08-10 NOTE — Progress Notes (Signed)
Subjective:   Patient ID: Taylor [name], male    DOB: 08-25-1955 MRN: [id number]    Brief patient profile:  65  yowm quit smoking 2015  sinus congestion with spring just in 2014 took a few clariton referred by ER for sob after evaluation there for ? IHD > ruled out clinically but GOLD IV COPD documented 12/15/2012    History of Present Illness   Previous eval at [facility] for sob Jan 2014 Admit date: 02/03/2012  Discharge date: 02/06/2012  Discharge Diagnoses:  Principal Problem:  *COPD (chronic obstructive pulmonary disease)  Active Problems:  Colon cancer, sigmoid  Thrombocytopenia  Hyperglycemia  Dyspepsia  Bipolar 1 disorder  Depression       11/18/2017  f/u ov/Taylor [name] re: post hosp f/u - re-establish care  Chief Complaint  Patient presents with  . Hospitalization Follow-up    Hospital 11/13/2017, currently on prednisone. Antibiotic was prescribed, patient has not picked up from pharmacy. States his breathing is better since hospital visit. His sugars have been running high with prednisone.   Breo this am   instead of trelegy  - not apparent problem with trelegy not clar why changed to breo ? hosp/insurance restrictions?  - denies bladder problem while on lama ? If hx accurate  02 2lpm 24/7 Still using alb q 6 h despite rx with maint breo  Doe  = MMRC4  = sob if tries to leave home or while getting dressed   rec Prednisone 10 mg take 2 daily with breakfast until better then 1 daily Prilosec Take two of them  30-60 min before first meal of the day and pepcid 20 mg at bedtime until return GERD  Diet  Plan A = Automatic = first thing in am Dulera 200 x 2 and spiriva x 2 then the duelra  200 x 2  At 12 hours later Plan B = Backup Only use your albuterol as a rescue medication   Plan C = Crisis - only use your albuterol nebulizer if you first try Plan B and it fails to help > ok to use the nebulizer up to every 4 hours but if start needing it regularly call for immediate  appointment    12/29/2017  f/u ov/Taylor [name] re:  GOLD IV copd/ 02 dep @ 3lpm / cannot afford anything but nebs and no better p trial of trelegy samples Chief Complaint  Patient presents with  . Follow-up    PFT's done today.  Breathing has been same since "I ran out of everything" out of trelegy and albuterol inhaler approx 2 wks ago. He has been using duonebs approx 4 x per day.   Dyspnea:  MMRC4  = sob if tries to leave home or while getting dressed   Cough: none Sleeping: on side bed flat  SABA use: duoneb qid  02: 3lpm   24/7 rec Ok use duoneb up to 4 x in 24 hours If breathing gets worse:  Prednisone 10 mg x 2 each am until better then 1 daily x 5 days and stop  - consider lama/laba neb next ov if not doing will on duoneb qid    02/10/2018  Pulmonary consultation/Taylor [name] re:  Finishing prednisone from recent flare / just on dulera 200 2bid/ needs clearance for prostate surgery (seeds) Chief Complaint  Patient presents with  . Consult    surgical clearance  Dyspnea:  MMRC3 = can't walk 100 yards even at a slow pace at a flat grade s stopping due to sob  Cough: none now Sleeping: bed is flat / on side / 2 pillow SABA use: duoneb q 4h despite dulera 2 bid with poor hfa noted  02: 2lpm at rest,  3lpm walking  rec Prednisone  10 mg 1 daily x 3 days and then one half daily x 3 days and stop Start trelegy each am (equivalent of dulera and spiriva)  Only use your albuterol as a rescue medication to be used if you can't catch your breath    Virtual Visit via Telephone Note 08/10/2018   I connected with Taylor [name] on 08/10/18 at  0920 by telephone and verified that I am speaking with the correct person using two identifiers.   I discussed the limitations, risks, security and privacy concerns of performing an evaluation and management service by telephone and the availability of in person appointments. I also discussed with the patient that there may be a patient responsible charge  related to this service. The patient expressed understanding and agreed to proceed.   History of Present Illness: re GOLD IV copd/ 02 dep now out of spiriva/dulera ? [facility] involved [facility] refills?  Getting worse x 3 days to point where bedridden  Dyspnea:  Ok at rest but can't get out of bed , totally confused with issues re access to care / meds Cough: no Sleeping: poorly ? Due to breathing  SABA use: out of saba/ was using duoneb  02: 3lpm    No obvious day to day or daytime variability or assoc excess/ purulent sputum or mucus plugs or hemoptysis or cp or chest tightness, subjective wheeze or overt sinus or hb symptoms.    Also denies any obvious fluctuation of symptoms with weather or environmental changes or other aggravating or alleviating factors except as outlined above.   Meds reviewed/ med reconciliation completed       Observations/Objective: Speaking in phrases only, sounds somewhat desperate for help but very poor insight on how to get it   Assessment and Plan: See problem list for active a/p's   Follow Up Instructions: See avs for instructions unique to this ov which includes revised/ updated med list     I discussed the assessment and treatment plan with the patient. The patient was provided an opportunity to ask questions and all were answered. The patient agreed with the plan and demonstrated an understanding of the instructions.   The patient was advised to call back or seek an in-person evaluation if the symptoms worsen or if the condition fails to improve as anticipated.  I provided 25 minutes of non-face-to-face time during this encounter.   Christinia Gully, MD

## 2018-08-10 NOTE — ED Notes (Signed)
Non rebreather taken off. Patient placed on 3 litters [place] @ 100%.

## 2018-08-10 NOTE — ED Notes (Signed)
First dose of solumedrol removed from pyxis would not dilute. Malfunction error. Removed 2nd dose with Lilia [employer], [employer].

## 2018-08-10 NOTE — ED Notes (Signed)
Patient given OJ and sandwich for low sugar.

## 2018-08-10 NOTE — H&P (Signed)
History and Physical    Taylor [name] EYC:[id number] DOB: [birth year] DOA: 08/10/2018  PCP: [name], NP   Patient coming from: home.   I have personally briefly reviewed patient's old medical records in [place]  Chief Complaint: unable to breath since 2 days.   HPI: Taylor [name] is a 63 y.o. male with medical history significant of chronic hypoxic respiratory failure on 3 L of nasal cannula oxygen at home prostate cancer, COPD, recurrent admissions for COPD exacerbation, bipolar disorder, asthma, tobacco abuse presents with worsening shortness of breath with diffuse wheezing today patient denies any fevers or chills he reports some dry cough associated with shortness of breath.  He denies any nausea vomiting or abdominal pain.  He denies any dizziness headache or syncope.  He denies any dysuria or dysphagia.   ED Course: On arrival to ED patient is afebrile normotensive, tachycardic and tachypneic with oxygen sats low 90%.  Labs revealed sodium of 122, potassium of 4.4, CO2 of 31, glucose of 65, hemoglobin of 12.6, normal WBC count normal platelets.  Chest x-ray showed no active pulmonary disease. COVID screening test negative.  Review of Systems: As per HPI otherwise 10 point review of systems negative.    Past Medical History:  Diagnosis Date  . Acute on chronic respiratory failure ([place])   . Asthma   . Bipolar 1 disorder ([place]) 02/05/2012  . Colon cancer ([place]) 04/11/11   adenocarcinoma of colon, 7/19 nodes pos.FINISHED CHEMO/DR. SHERRILL  . COPD (chronic obstructive pulmonary disease) ([place])    SMOKER  . Depression   . Dyspnea   . Emphysema of lung ([place])   . Full dentures   . Hemorrhoids   . On home oxygen therapy    "3L; 24/7" (05/29/2017)  . Oxygen deficiency   . Pneumonia ~ 2016   "double pneumonia"  . Prostate cancer ([place])    Gleason score = 7, supposed to have radiation therapy but he has not followed up (05/29/2017)  . Rib fractures    hx  of    Past Surgical History:  Procedure Laterality Date  . COLON SURGERY  04/11/11   Sigmoid colectomy  . COLOSTOMY REVISION  04/11/2011   Procedure: COLON RESECTION SIGMOID;  Surgeon: [name], MD;  Location: [facility];  Service: General;  Laterality: N/A;  low anterior colon resection   . GOLD SEED IMPLANT N/A 02/24/2018   Procedure: GOLD SEED IMPLANT;  Surgeon: [name], MD;  Location: [facility];  Service: Urology;  Laterality: N/A;  . HERNIA REPAIR    . INGUINAL HERNIA REPAIR Right 05/01/2012   Procedure: HERNIA REPAIR INGUINAL ADULT;  Surgeon: [name], MD;  Location: [facility];  Service: General;  Laterality: Right;  . INSERTION OF MESH Right 05/01/2012   Procedure: INSERTION OF MESH;  Surgeon: [name], MD;  Location: [facility];  Service: General;  Laterality: Right;  . PORT-A-CATH REMOVAL Left 05/01/2012   Procedure: REMOVAL Infusion Port;  Surgeon: [name], MD;  Location: [facility];  Service: General;  Laterality: Left;  . PORTACATH PLACEMENT  05/02/2011   Procedure: INSERTION PORT-A-CATH;  Surgeon: [name], MD;  Location: [facility];  Service: General;  Laterality: N/A;  . PROSTATE BIOPSY    . SPACE OAR INSTILLATION N/A 02/24/2018   Procedure: SPACE OAR INSTILLATION;  Surgeon: [name], MD;  Location: [facility];  Service: Urology;  Laterality: N/A;     reports that he quit smoking about 14 months  ago. His smoking use included cigarettes. He has a 4.60 pack-year smoking history. He has quit using smokeless tobacco.  His smokeless tobacco use included chew. He reports previous alcohol use. He reports that he does not use drugs.  Allergies  Allergen Reactions  . Codeine Nausea And Vomiting    Family History  Problem Relation Age of Onset  . Heart disease Father   . Heart failure Mother   . Heart disease Mother   . Lung cancer Maternal Uncle        smoked   Family history reviewed and not pertinent   Prior to Admission medications   Medication Sig Start Date End Date  Taking? Authorizing Provider  albuterol (PROVENTIL HFA) 108 (90 Base) MCG/ACT inhaler Inhale 2 puffs into the lungs every 6 (six) hours as needed for wheezing or shortness of breath. 08/06/18 09/05/18 Yes [name], MD  albuterol (PROVENTIL) (2.5 MG/3ML) 0.083% nebulizer solution Take 3 mLs (2.5 mg total) by nebulization every 4 (four) hours as needed for wheezing or shortness of breath (if you can't catch your breath). 08/06/18  Yes [name], MD  ARIPiprazole (ABILIFY) 5 MG tablet Take 1 tablet (5 mg total) by mouth daily for 30 days. 05/28/18 08/10/18 Yes Elodia Florence., MD  escitalopram (LEXAPRO) 20 MG tablet Take 1 tablet (20 mg total) by mouth daily for 30 days. 05/28/18 08/10/18 Yes Elodia Florence., MD  folic acid (FOLVITE) 1 MG tablet Take 1 tablet (1 mg total) by mouth daily. 07/04/18  Yes Allie Bossier, MD  lovastatin (MEVACOR) 20 MG tablet Take 1 tablet (20 mg total) by mouth at bedtime for 30 days. 05/28/18 08/10/18 Yes Elodia Florence., MD  metFORMIN (GLUCOPHAGE) 1000 MG tablet Take 1 tablet (1,000 mg total) by mouth 2 (two) times daily with a meal. 07/24/18  Yes Alekh, Kshitiz, MD  mometasone-formoterol (DULERA) 100-5 MCG/ACT AERO Inhale 2 puffs into the lungs 2 (two) times daily. 08/06/18  Yes [name], MD  OXYGEN Inhale 3 L into the lungs continuous.    Yes [provider]  tamsulosin (FLOMAX) 0.4 MG CAPS capsule Take 1 capsule (0.4 mg total) by mouth daily. 07/24/18  Yes [name], MD  thiamine 100 MG tablet Take 1 tablet (100 mg total) by mouth daily. 07/04/18  Yes Allie Bossier, MD  Blood Glucose Monitoring Suppl (ACCU-CHEK AVIVA) device Use as instructed 10/27/17 10/27/18  Clent Demark, PA-C  glucose blood (ACCU-CHEK AVIVA) test strip 1 each by Other route as needed for other. Use as instructed 10/27/17   Clent Demark, PA-C  Lancets [facility]) lancets 1 each by Other route 2 (two) times daily. Use as instructed 10/27/17    Clent Demark, PA-C  tiotropium [facility]) 18 MCG inhalation capsule Place 1 capsule (18 mcg total) into inhaler and inhale daily. 08/06/18   [name], MD    Physical Exam: Vitals:   08/10/18 1330 08/10/18 1400 08/10/18 1430 08/10/18 1500  BP: 116/72 122/78 124/76 (!) 124/97  Pulse: 98 (!) 114 (!) 114 (!) 122  Resp: (!) 22 (!) 27 18 (!) 25  Temp:      TempSrc:      SpO2: 95% 95% (!) 89% 94%    Constitutional: in mild distress from tachypnea. On 3lit of [place] oxygen.  Vitals:   08/10/18 1330 08/10/18 1400 08/10/18 1430 08/10/18 1500  BP: 116/72 122/78 124/76 (!) 124/97  Pulse: 98 (!) 114 (!) 114 Marland Kitchen)  122  Resp: (!) 22 (!) 27 18 (!) 25  Temp:      TempSrc:      SpO2: 95% 95% (!) 89% 94%   Eyes: PERRL, lids and conjunctivae normal ENMT: Mucous membranes are dry Neck: normal, supple, no masses, no thyromegaly Respiratory: bilateral wheezing heard, air entry fair.  Cardiovascular: tachycardic, no murmer. S1S2 heard.  Abdomen: no tenderness, no masses palpated. No hepatosplenomegaly. Bowel sounds positive.  Musculoskeletal: no clubbing / cyanosis.  Skin: no rashes, lesions, ulcers. No induration Neurologic: alert and oriented, grossly normal .  Psychiatric: Alert and oriented x 3. Normal mood.     Labs on Admission: I have personally reviewed following labs and imaging studies  CBC: Recent Labs  Lab 08/10/18 1231  WBC 5.3  HGB 12.6*  HCT 38.1*  MCV 92.3  PLT 109   Basic Metabolic Panel: Recent Labs  Lab 08/10/18 1231  NA 122*  K 4.4  CL 77*  CO2 31  GLUCOSE 65*  BUN 13  CREATININE 0.42*  CALCIUM 7.8*   GFR: CrCl cannot be calculated (Unknown ideal weight.). Liver Function Tests: No results for input(s): AST, ALT, ALKPHOS, BILITOT, PROT, ALBUMIN in the last 168 hours. No results for input(s): LIPASE, AMYLASE in the last 168 hours. No results for input(s): AMMONIA in the last 168 hours. Coagulation Profile: No results for input(s): INR, PROTIME in  the last 168 hours. Cardiac Enzymes: No results for input(s): CKTOTAL, CKMB, CKMBINDEX, TROPONINI in the last 168 hours. BNP (last 3 results) No results for input(s): PROBNP in the last 8760 hours. HbA1C: No results for input(s): HGBA1C in the last 72 hours. CBG: Recent Labs  Lab 08/10/18 1213  GLUCAP 73   Lipid Profile: No results for input(s): CHOL, HDL, LDLCALC, TRIG, CHOLHDL, LDLDIRECT in the last 72 hours. Thyroid Function Tests: No results for input(s): TSH, T4TOTAL, FREET4, T3FREE, THYROIDAB in the last 72 hours. Anemia Panel: No results for input(s): VITAMINB12, FOLATE, FERRITIN, TIBC, IRON, RETICCTPCT in the last 72 hours. Urine analysis:    Component Value Date/Time   COLORURINE YELLOW 07/20/2018 1330   APPEARANCEUR CLEAR 07/20/2018 1330   LABSPEC 1.008 07/20/2018 1330   PHURINE 6.0 07/20/2018 1330   GLUCOSEU NEGATIVE 07/20/2018 1330   HGBUR NEGATIVE 07/20/2018 1330   BILIRUBINUR NEGATIVE 07/20/2018 1330   KETONESUR NEGATIVE 07/20/2018 1330   PROTEINUR NEGATIVE 07/20/2018 1330   UROBILINOGEN 1.0 01/09/2013 1843   NITRITE NEGATIVE 07/20/2018 1330   LEUKOCYTESUR NEGATIVE 07/20/2018 1330    Radiological Exams on Admission: Dg Chest Portable 1 View  Result Date: 08/10/2018 CLINICAL DATA:  Shortness of breath for 2 days.  History of COPD. EXAM: PORTABLE CHEST 1 VIEW COMPARISON:  Radiographs 07/20/2018 and 06/27/2018. FINDINGS: 1309 hours. The heart size and mediastinal contours are stable. The lungs are hyperinflated consistent with emphysema. No superimposed edema, airspace disease, pleural effusion or pneumothorax identified. Telemetry leads overlie the chest. There are old upper rib fractures on the right. No acute osseous findings. IMPRESSION: Stable chest without evidence of acute cardiopulmonary process. Emphysema. Electronically Signed   By: Richardean Sale M.D.   On: 08/10/2018 13:59    EKG: Independently reviewed.   Assessment/Plan Active Problems:   COPD  with acute exacerbation (HCC)   Hyponatremia    Acute on chronic respiratory failure with hypoxia secondary to acute COPD exacerbation admit the patient observation start him on IV Solu-Medrol 60 daily 6 hours. Continue with Dulera and duo nebs every 6 hours. IV azithromycin for acute bronchitis. In  view of recurrent admissions and deconditioning palliative care consulted.    Type 2 diabetes mellitus with hyperglycemia Hold metformin and continue with sliding scale insulin while inpatient.    Hypertension Continue home medications.    Hyponatremia Suspect secondary to dehydration and poor oral intake. Gentle hydration ordered and repeat BMP tomorrow.    Bipolar disorder Continue with the Lexapro and aripiprazole   Generalized deconditioning and debility PT eval ordered  DVT prophylaxis: Lovenox Code Status: DNR Family Communication: None at bedside  disposition Plan: Pending clinical improvement Consults called: Palliative care Admission status: observation/telemetry   Hosie Poisson MD [facility] Pager [phone]  If 7PM-7AM, please contact night-coverage [email or web address] Password [facility]  08/10/2018, 3:51 PM
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
## 2018-08-10 NOTE — Assessment & Plan Note (Signed)
Quit smoking 2015  - 09/17/2012  Walked RA x 3 laps @ 185 ft each stopped due to  End of study, no desat - Spirometry 09/17/2012 FEV1  0.80 (21%) ratio 52  - PFT's 12/15/2012  FEV1  0.89 (24%) with Ratio 38 p am saba and DLCO 44% corrects to 68%  - PFTs 12/07/13      FEV1   1.18 (32%)  Ratio 40  And no change p saba DLCO 50 corrects to 68% took neither spiriva nor dulera before pfts - Alpha one screen 12/15/2012 >  MM -   06/05/2016 p extensive coaching HFA effectiveness =    90% from a baseline of 50% (Ti too short) - 06/05/2016 changed spiriva to respimat x one sample  And started daily prednisone  10/17/2017 Three hospital admissions for COPD exacerbation. Not taking dulera or spirivia currently. Can't afford medication. Changed maintenance inhaler to Trelegy to enhance compliance and given patient assistance  - PFT's  12/29/2017  FEV1 0.58 (16 % ) ratio 38  p 7 % improvement from saba p nothing prior to study with DLCO  32/25c % corrects to 61 % for alv volume with classic copd curvature  - 02/10/2018  After extensive coaching inhaler device,  effectiveness =    90% with elipta so rec trelegy q day    Was doing better on meds but now decompensating, confused how to access meds and just wants more albuterol which I explained was only intended as rescue and he will need some major case management to prevent him from recurrent flares so nothing I can do at this point but refer him back to ER and offer to see him with all meds in hand using a trust but verify approach to confirm accurate Medication  Reconciliation The principal here is that until we are certain that the  patients are doing what we've asked, it makes no sense to ask them to do more.

## 2018-08-10 NOTE — ED Triage Notes (Signed)
Patient arrived via [facility] from home.   Patient c/o shob X2 days   Patient is out of albuterol. Patient states he usually clear sup with steroids.    Hx. COPD and current smoker, colon cancer, and prostate cancer (stopped treatment), hypertension, and DM.   CBG-36 (nothing given by ems)   3 Litter [place] home 02- 99% when ems arrived.  Auditory wheezes per ems   A/Ox4   Tachy-115  Nonrebreather put on by ems for comfort so patient could walk from home to ambulance.   Patient states he has been out of his inulin as well.    20 g left hand

## 2018-08-10 NOTE — ED Notes (Addendum)
Report given to 4 E

## 2018-08-11 ENCOUNTER — Observation Stay (HOSPITAL_COMMUNITY): Payer: Medicaid Other

## 2018-08-11 ENCOUNTER — Ambulatory Visit: Payer: Medicaid Other

## 2018-08-11 DIAGNOSIS — Z9981 Dependence on supplemental oxygen: Secondary | ICD-10-CM | POA: Diagnosis not present

## 2018-08-11 DIAGNOSIS — J441 Chronic obstructive pulmonary disease with (acute) exacerbation: Secondary | ICD-10-CM | POA: Diagnosis not present

## 2018-08-11 DIAGNOSIS — Z8546 Personal history of malignant neoplasm of prostate: Secondary | ICD-10-CM | POA: Diagnosis not present

## 2018-08-11 DIAGNOSIS — E1165 Type 2 diabetes mellitus with hyperglycemia: Secondary | ICD-10-CM | POA: Diagnosis present

## 2018-08-11 DIAGNOSIS — Z20828 Contact with and (suspected) exposure to other viral communicable diseases: Secondary | ICD-10-CM | POA: Diagnosis present

## 2018-08-11 DIAGNOSIS — J9622 Acute and chronic respiratory failure with hypercapnia: Secondary | ICD-10-CM | POA: Diagnosis present

## 2018-08-11 DIAGNOSIS — Z599 Problem related to housing and economic circumstances, unspecified: Secondary | ICD-10-CM | POA: Diagnosis not present

## 2018-08-11 DIAGNOSIS — Z8249 Family history of ischemic heart disease and other diseases of the circulatory system: Secondary | ICD-10-CM | POA: Diagnosis not present

## 2018-08-11 DIAGNOSIS — E871 Hypo-osmolality and hyponatremia: Secondary | ICD-10-CM | POA: Diagnosis present

## 2018-08-11 DIAGNOSIS — E11649 Type 2 diabetes mellitus with hypoglycemia without coma: Secondary | ICD-10-CM | POA: Diagnosis present

## 2018-08-11 DIAGNOSIS — Z515 Encounter for palliative care: Secondary | ICD-10-CM | POA: Diagnosis not present

## 2018-08-11 DIAGNOSIS — R0603 Acute respiratory distress: Secondary | ICD-10-CM | POA: Diagnosis not present

## 2018-08-11 DIAGNOSIS — R531 Weakness: Secondary | ICD-10-CM | POA: Diagnosis not present

## 2018-08-11 DIAGNOSIS — R0602 Shortness of breath: Secondary | ICD-10-CM | POA: Diagnosis not present

## 2018-08-11 DIAGNOSIS — R0609 Other forms of dyspnea: Secondary | ICD-10-CM | POA: Diagnosis not present

## 2018-08-11 DIAGNOSIS — Z87891 Personal history of nicotine dependence: Secondary | ICD-10-CM | POA: Diagnosis not present

## 2018-08-11 DIAGNOSIS — I10 Essential (primary) hypertension: Secondary | ICD-10-CM | POA: Diagnosis present

## 2018-08-11 DIAGNOSIS — Z7951 Long term (current) use of inhaled steroids: Secondary | ICD-10-CM | POA: Diagnosis not present

## 2018-08-11 DIAGNOSIS — J209 Acute bronchitis, unspecified: Secondary | ICD-10-CM | POA: Diagnosis present

## 2018-08-11 DIAGNOSIS — Z66 Do not resuscitate: Secondary | ICD-10-CM | POA: Diagnosis not present

## 2018-08-11 DIAGNOSIS — Z885 Allergy status to narcotic agent status: Secondary | ICD-10-CM | POA: Diagnosis not present

## 2018-08-11 DIAGNOSIS — Z801 Family history of malignant neoplasm of trachea, bronchus and lung: Secondary | ICD-10-CM | POA: Diagnosis not present

## 2018-08-11 DIAGNOSIS — F319 Bipolar disorder, unspecified: Secondary | ICD-10-CM | POA: Diagnosis present

## 2018-08-11 DIAGNOSIS — J9621 Acute and chronic respiratory failure with hypoxia: Secondary | ICD-10-CM | POA: Diagnosis present

## 2018-08-11 DIAGNOSIS — J440 Chronic obstructive pulmonary disease with acute lower respiratory infection: Secondary | ICD-10-CM | POA: Diagnosis present

## 2018-08-11 DIAGNOSIS — Z79899 Other long term (current) drug therapy: Secondary | ICD-10-CM | POA: Diagnosis not present

## 2018-08-11 DIAGNOSIS — Z7984 Long term (current) use of oral hypoglycemic drugs: Secondary | ICD-10-CM | POA: Diagnosis not present

## 2018-08-11 DIAGNOSIS — Z85038 Personal history of other malignant neoplasm of large intestine: Secondary | ICD-10-CM | POA: Diagnosis not present

## 2018-08-11 LAB — CBC
HCT: 34.7 % — ABNORMAL LOW (ref 39.0–52.0)
Hemoglobin: 11.8 g/dL — ABNORMAL LOW (ref 13.0–17.0)
MCH: 31.1 pg (ref 26.0–34.0)
MCHC: 34 g/dL (ref 30.0–36.0)
MCV: 91.6 fL (ref 80.0–100.0)
Platelets: 164 10*3/uL (ref 150–400)
RBC: 3.79 MIL/uL — ABNORMAL LOW (ref 4.22–5.81)
RDW: 12.6 % (ref 11.5–15.5)
WBC: 1.7 10*3/uL — ABNORMAL LOW (ref 4.0–10.5)
nRBC: 0 % (ref 0.0–0.2)

## 2018-08-11 LAB — BASIC METABOLIC PANEL
Anion gap: 9 (ref 5–15)
BUN: 7 mg/dL — ABNORMAL LOW (ref 8–23)
CO2: 34 mmol/L — ABNORMAL HIGH (ref 22–32)
Calcium: 8.3 mg/dL — ABNORMAL LOW (ref 8.9–10.3)
Chloride: 87 mmol/L — ABNORMAL LOW (ref 98–111)
Creatinine, Ser: 0.43 mg/dL — ABNORMAL LOW (ref 0.61–1.24)
GFR calc Af Amer: >60 mL/min (ref >60)
GFR calc non Af Amer: >60 mL/min (ref >60)
Glucose, Bld: 215 mg/dL — ABNORMAL HIGH (ref 70–99)
Potassium: 4.3 mmol/L (ref 3.5–5.1)
Sodium: 130 mmol/L — ABNORMAL LOW (ref 135–145)

## 2018-08-11 LAB — GLUCOSE, CAPILLARY
Glucose-Capillary: 187 mg/dL — ABNORMAL HIGH (ref 70–99)
Glucose-Capillary: 265 mg/dL — ABNORMAL HIGH (ref 70–99)
Glucose-Capillary: 181 mg/dL — ABNORMAL HIGH (ref 70–99)
Glucose-Capillary: 118 mg/dL — ABNORMAL HIGH (ref 70–99)

## 2018-08-11 LAB — HEMOGLOBIN A1C
Hgb A1c MFr Bld: 5.8 % — ABNORMAL HIGH (ref 4.8–5.6)
Mean Plasma Glucose: 119.76 mg/dL

## 2018-08-11 MED ORDER — ORAL CARE MOUTH RINSE
15.0000 mL | Freq: Two times a day (BID) | OROMUCOSAL | Status: DC
  Administered 2018-08-11 – 2018-08-13 (×5): 15 mL via OROMUCOSAL

## 2018-08-11 MED ORDER — LORAZEPAM 1 MG PO TABS
1.0000 mg | ORAL_TABLET | Freq: Three times a day (TID) | ORAL | Status: DC | PRN
  Administered 2018-08-11 – 2018-08-12 (×2): 1 mg via ORAL
  Filled 2018-08-11 (×2): qty 1

## 2018-08-11 MED ORDER — POLYETHYLENE GLYCOL 3350 17 G PO PACK
17.0000 g | PACK | Freq: Every day | ORAL | Status: DC
  Filled 2018-08-11: qty 1

## 2018-08-11 MED ORDER — SENNOSIDES-DOCUSATE SODIUM 8.6-50 MG PO TABS
1.0000 | ORAL_TABLET | Freq: Two times a day (BID) | ORAL | Status: DC
  Administered 2018-08-12 – 2018-08-13 (×3): 1 via ORAL
  Filled 2018-08-11 (×5): qty 1
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
## 2018-08-11 NOTE — Consult Note (Signed)
Consultation Note Date: 08/11/2018   Patient Name: Taylor [name]  DOB: [birth year]  MRN: [id number]  Age / Sex: 63 y.o., male  PCP: [name], NP Referring Physician: Hosie Poisson, MD  Reason for Consultation: Establishing goals of care and Psychosocial/spiritual support  HPI/Patient Profile: 63 y.o. male   admitted on 08/10/2018 with  a past medical history significant forCOPD with chronic hypoxic and hypercarbic respiratory failure, bipolar disorder, colon cancer status post resection and chemotherapy, type 2 diabetes mellitus, hypertension, prostate cancer undergoing radiation, and cocaine abuse, now presenting to the emergency department for evaluation of shortness of breath.   Multiple hospitalizations over the past 6 months for similar events.  Patient was admitted to the hospital earlier this month for COPD exacerbation, reports that he continued to do well back at home for a couple weeks from a respiratory perspective, but now over the past several days to a week, he has had progressive worsening in his dyspnea, wheezing, but not much change in his chronic cough and no fevers or chills  Dense psycho-social issues.  In and out of unstable housing issues. Currently living with a friend in a trailer, no air-conditioning   No real social support. He tells me the man who lets him live in his trailer, "he is crazy"  Patient faces treatment option decisions, advanced directive decisions and anticipatory care needs.  Clinical Assessment and Goals of Care:   This NP Wadie Lessen reviewed medical records, received report from team, assessed the patient and then meet at the patient's bedside  to discuss diagnosis, prognosis, GOC, EOL wishes disposition and options.  Concept of Hospice and Palliative Care were discussed  A detailed discussion was had today regarding advanced directives.   Concepts specific to code status, artifical feeding and hydration, continued IV antibiotics and rehospitalization was had.  The difference between a aggressive medical intervention path  and a palliative comfort care path for this patient at this time was had.  Values and goals of care important to patient and family were attempted to be elicited.  Patient verbalizes an understanding of his worsening medical condition.  He tells me "things are getting worse and worse and I just keep coming in and out of the hospital".  "Please just help me"  We discussed human mortality and natural trajectory of ES-pulmonary disease and he adds " and everything else that is wrong with me"    Questions and concerns addressed.   PMT will continue to support holistically.   SUMMARY OF RECOMMENDATIONS    Code Status/Advance Care Planning:  DNR   Comfort and dingity are focus of care  Avoid rehospitalizations   Palliative Prophylaxis:   Delirium Protocol, Frequent Pain Assessment and Oral Care  Additional Recommendations (Limitations, Scope, Preferences):  Avoid Hospitalization and No Artificial Feeding  Psycho-social/Spiritual:   Desire for further Chaplaincy support:yes  Additional Recommendations: Education on Hospice  Prognosis:   < 6 months  Discharge Planning: Home with Hospice      Primary Diagnoses: Present on Admission: .  Hyponatremia . COPD with acute exacerbation ([place])   I have reviewed the medical record, interviewed the patient and family, and examined the patient. The following aspects are pertinent.  Past Medical History:  Diagnosis Date  . Acute on chronic respiratory failure ([place])   . Asthma   . Bipolar 1 disorder ([place]) 02/05/2012  . Colon cancer ([place]) 04/11/11   adenocarcinoma of colon, 7/19 nodes pos.FINISHED CHEMO/DR. SHERRILL  . COPD (chronic obstructive pulmonary disease) ([place])    SMOKER  . Depression   . Dyspnea   . Emphysema of lung ([place])   . Full  dentures   . Hemorrhoids   . On home oxygen therapy    "3L; 24/7" (05/29/2017)  . Oxygen deficiency   . Pneumonia ~ 2016   "double pneumonia"  . Prostate cancer ([place])    Gleason score = 7, supposed to have radiation therapy but he has not followed up (05/29/2017)  . Rib fractures    hx of   Social History   Socioeconomic History  . Marital status: Divorced    Spouse name: Not on file  . Number of children: 1  . Years of education: Not on file  . Highest education level: Not on file  Occupational History  . Occupation: disability pending  Social Needs  . Financial resource strain: Not hard at all  . Food insecurity    Worry: Never true    Inability: Never true  . Transportation needs    Medical: Yes    Non-medical: Yes  Tobacco Use  . Smoking status: Former Smoker    Packs/day: 0.10    Years: 46.00    Pack years: 4.60    Types: Cigarettes    Quit date: 05/21/2017    Years since quitting: 1.2  . Smokeless tobacco: Former Systems developer    Types: Chew  Substance and Sexual Activity  . Alcohol use: Not Currently    Comment: h/o use in the past, no h/o heavy use  . Drug use: No  . Sexual activity: Not Currently  Lifestyle  . Physical activity    Days per week: 0 days    Minutes per session: 0 min  . Stress: Very much  Relationships  . Social Herbalist on phone: More than three times a week    Gets together: Never    Attends religious service: Never    Active member of club or organization: No    Attends meetings of clubs or organizations: Never    Relationship status: Divorced  Other Topics Concern  . Not on file  Social History Narrative   Lives alone-divorced, disabled, in [place] 1970's   Brother, Hallis Meditz and his wife Jackelyn Poling assist   Prior occupation: Clinical biochemist   Family History  Problem Relation Age of Onset  . Heart disease Father   . Heart failure Mother   . Heart disease Mother   . Lung cancer Maternal Uncle        smoked   Scheduled Meds: .  ARIPiprazole  5 mg Oral Daily  . enoxaparin (LOVENOX) injection  40 mg Subcutaneous Q24H  . escitalopram  20 mg Oral Daily  . folic acid  1 mg Oral Daily  . insulin aspart  0-5 Units Subcutaneous QHS  . insulin aspart  0-9 Units Subcutaneous TID WC  . ipratropium-albuterol  3 mL Nebulization TID  . mouth rinse  15 mL Mouth Rinse BID  . methylPREDNISolone (SOLU-MEDROL) injection  60 mg Intravenous Q6H  .  mometasone-formoterol  2 puff Inhalation BID  . polyethylene glycol  17 g Oral Daily  . pravastatin  40 mg Oral q1800  . senna-docusate  1 tablet Oral BID  . tamsulosin  0.4 mg Oral Daily  . thiamine  100 mg Oral Daily   Continuous Infusions: . sodium chloride 75 mL/hr at 08/11/18 0643  . azithromycin 500 mg (08/10/18 1729)   PRN Meds:.acetaminophen, albuterol, ondansetron (ZOFRAN) IV Medications Prior to Admission:  Prior to Admission medications   Medication Sig Start Date End Date Taking? Authorizing Provider  albuterol (PROVENTIL HFA) 108 (90 Base) MCG/ACT inhaler Inhale 2 puffs into the lungs every 6 (six) hours as needed for wheezing or shortness of breath. 08/06/18 09/05/18 Yes [name], MD  albuterol (PROVENTIL) (2.5 MG/3ML) 0.083% nebulizer solution Take 3 mLs (2.5 mg total) by nebulization every 4 (four) hours as needed for wheezing or shortness of breath (if you can't catch your breath). 08/06/18  Yes [name], MD  ARIPiprazole (ABILIFY) 5 MG tablet Take 1 tablet (5 mg total) by mouth daily for 30 days. 05/28/18 08/10/18 Yes Elodia Florence., MD  escitalopram (LEXAPRO) 20 MG tablet Take 1 tablet (20 mg total) by mouth daily for 30 days. 05/28/18 08/10/18 Yes Elodia Florence., MD  folic acid (FOLVITE) 1 MG tablet Take 1 tablet (1 mg total) by mouth daily. 07/04/18  Yes Allie Bossier, MD  lovastatin (MEVACOR) 20 MG tablet Take 1 tablet (20 mg total) by mouth at bedtime for 30 days. 05/28/18 08/10/18 Yes Elodia Florence., MD  metFORMIN (GLUCOPHAGE) 1000 MG  tablet Take 1 tablet (1,000 mg total) by mouth 2 (two) times daily with a meal. 07/24/18  Yes Alekh, Kshitiz, MD  mometasone-formoterol (DULERA) 100-5 MCG/ACT AERO Inhale 2 puffs into the lungs 2 (two) times daily. 08/06/18  Yes [name], MD  OXYGEN Inhale 3 L into the lungs continuous.    Yes [provider]  tamsulosin (FLOMAX) 0.4 MG CAPS capsule Take 1 capsule (0.4 mg total) by mouth daily. 07/24/18  Yes [name], MD  thiamine 100 MG tablet Take 1 tablet (100 mg total) by mouth daily. 07/04/18  Yes Allie Bossier, MD  Blood Glucose Monitoring Suppl (ACCU-CHEK AVIVA) device Use as instructed 10/27/17 10/27/18  Clent Demark, PA-C  glucose blood (ACCU-CHEK AVIVA) test strip 1 each by Other route as needed for other. Use as instructed 10/27/17   Clent Demark, PA-C  Lancets [facility]) lancets 1 each by Other route 2 (two) times daily. Use as instructed 10/27/17   Clent Demark, PA-C  tiotropium [facility]) 18 MCG inhalation capsule Place 1 capsule (18 mcg total) into inhaler and inhale daily. 08/06/18   [name], MD   Allergies  Allergen Reactions  . Codeine Nausea And Vomiting   Review of Systems  Constitutional: Positive for fatigue.  Respiratory: Positive for cough and shortness of breath.   Neurological: Positive for weakness.    Physical Exam Constitutional:      Appearance: He is underweight. He is ill-appearing.     Interventions: Nasal cannula in place.  Cardiovascular:     Rate and Rhythm: Tachycardia present.  Pulmonary:     Breath sounds: Decreased breath sounds present.  Skin:    General: Skin is warm and dry.  Neurological:     Mental Status: He is alert.     Vital Signs: BP 115/77 (BP Location: Left Arm)   Pulse 88  Temp (!) 97.3 F (36.3 C) (Oral)   Resp 16   Ht 5\' 11"  (1.803 m)   Wt 64.9 kg   SpO2 98%   BMI 19.96 kg/m  Pain Scale: 0-10   Pain Score: 0-No pain   SpO2: SpO2: 98 % O2 Device:SpO2: 98 % O2  Flow Rate: .O2 Flow Rate (L/min): 3.5 L/min(pt requested to have 02 down to 3 lpm - uses 3-3.5 at home)  IO: Intake/output summary:   Intake/Output Summary (Last 24 hours) at 08/11/2018 1324 Last data filed at 08/11/2018 1155 Gross per 24 hour  Intake 2655.3 ml  Output 2750 ml  Net -94.7 ml    LBM: Last BM Date: 08/10/18(per patient) Baseline Weight: Weight: 64.9 kg Most recent weight: Weight: 64.9 kg     Palliative Assessment/Data:  50% at best  Discussed with Dr Karleen Hampshire and CMRN   Time In: 1420 Time Out: 1530 Time Total: 70 minutes Greater than 50%  of this time was spent counseling and coordinating care related to the above assessment and plan.  Signed by: Wadie Lessen, NP   Please contact Palliative Medicine Team phone at [phone] for questions and concerns.  For individual provider: See Shea Evans

## 2018-08-11 NOTE — Progress Notes (Signed)
Patient had an episode of coughing with SOB. Oxygen saturation assessed 95 % 3 lt. VS are stable HR tachy 118 SR. Will continue to monitor.

## 2018-08-11 NOTE — Progress Notes (Signed)
Manufacturing engineer [facility]) Hospital Liaison note.     Notified by Oneita Hurt, CMRN of patient/family request for [facility] services at home after discharge.      [facility] notified of referral and will contact family to explain services and set up services.      Please call with any hospice related questions. [facility] can be reached at [phone].       Thank you for this referral.       Farrel Gordon, RN, CCM   [place] (listed on AMION under Hospice and [place] [facility] [place])  [phone]

## 2018-08-11 NOTE — Progress Notes (Signed)
PROGRESS NOTE    Taylor [name]  ZOX:[id number] DOB: [birth year] DOA: 08/10/2018 PCP: [name], NP    Brief Narrative:  Taylor [name] is a 63 y.o. male with medical history significant of chronic hypoxic respiratory failure on 3 L of nasal cannula oxygen at home prostate cancer, COPD, recurrent admissions for COPD exacerbation, bipolar disorder, asthma, tobacco abuse presents with worsening shortness of breath with diffuse wheezing since 2 to 3 days. He was admitted for COPD exacerbation and hyponatremia.   Assessment & Plan:   Active Problems:   COPD with acute exacerbation (HCC)   Hyponatremia   Acute on chronic respiratory failure with hypoxia  Recurrent admissions in the last 3 to 4 months for COPD exacerbations.  Patient on exam today appears debilitated and very deconditioned with tachypnea and unable to finish a sentence without pausing, continues to have shortness of breath and continues to have wheezing on exam.  Patient was started on IV Solu-Medrol , continue steroids for another 24 hours along with bronchodilators.  In view of his clinical deterioration, debility and recurrent admission palliative care consulted to see if he is a candidate for hospice.   Type 2 diabetes mellitus with hyperglycemia Continue with sliding scale insulin while inpatient. CBG (last 3)  Recent Labs    08/10/18 2021 08/11/18 0752 08/11/18 1212  GLUCAP 159* 187* 265*   Resume SSI.    Hyponatremia: improved with hydration.    Bipolar order:  Resume abilify and Lexapro.      DVT prophylaxis: lovenox.  Code Status: DNR Family Communication: NONE at bedside.  Disposition Plan: pending clinical improvement and palliative care consult.   Consultants:   Palliative care consult.   Procedures:  None.   Antimicrobials:azithromycin since admission.   Subjective: Very restless, no chest pain, continues to have sob.   Objective: Vitals:   08/10/18 2035  08/10/18 2207 08/11/18 0600 08/11/18 0753  BP:   115/77   Pulse:   88   Resp:   16   Temp:   (!) 97.3 F (36.3 C)   TempSrc:   Oral   SpO2: 97% 97% 98% 98%  Weight:      Height:        Intake/Output Summary (Last 24 hours) at 08/11/2018 1226 Last data filed at 08/11/2018 1155 Gross per 24 hour  Intake 2655.3 ml  Output 2750 ml  Net -94.7 ml   Filed Weights   08/10/18 1951  Weight: 64.9 kg    Examination:  General exam: Appears calm and comfortable  Respiratory system: bilateral wheezing heard , poor air entry.  Cardiovascular system: S1 & S2 heard, RRR. No pedal edema. Gastrointestinal system: Abdomen is soft NT nd bs+ Central nervous system: Alert and oriented. No focal neurological deficits. Extremities:  No pedal edema.  Skin: No rashes, lesions or ulcers Psychiatry:  Mood & affect appropriate.     Data Reviewed: I have personally reviewed following labs and imaging studies  CBC: Recent Labs  Lab 08/10/18 1231 08/11/18 0552  WBC 5.3 1.7*  HGB 12.6* 11.8*  HCT 38.1* 34.7*  MCV 92.3 91.6  PLT 192 811   Basic Metabolic Panel: Recent Labs  Lab 08/10/18 1231 08/11/18 0552  NA 122* 130*  K 4.4 4.3  CL 77* 87*  CO2 31 34*  GLUCOSE 65* 215*  BUN 13 7*  CREATININE 0.42* 0.43*  CALCIUM 7.8* 8.3*   GFR: Estimated Creatinine Clearance: 86.8 mL/min (A) (by C-G formula based on SCr of 0.43  mg/dL (L)). Liver Function Tests: No results for input(s): AST, ALT, ALKPHOS, BILITOT, PROT, ALBUMIN in the last 168 hours. No results for input(s): LIPASE, AMYLASE in the last 168 hours. No results for input(s): AMMONIA in the last 168 hours. Coagulation Profile: No results for input(s): INR, PROTIME in the last 168 hours. Cardiac Enzymes: No results for input(s): CKTOTAL, CKMB, CKMBINDEX, TROPONINI in the last 168 hours. BNP (last 3 results) No results for input(s): PROBNP in the last 8760 hours. HbA1C: Recent Labs    08/11/18 0552  HGBA1C 5.8*   CBG: Recent  Labs  Lab 08/10/18 1213 08/10/18 1648 08/10/18 2021 08/11/18 0752 08/11/18 1212  GLUCAP 73 117* 159* 187* 265*   Lipid Profile: No results for input(s): CHOL, HDL, LDLCALC, TRIG, CHOLHDL, LDLDIRECT in the last 72 hours. Thyroid Function Tests: Recent Labs    08/10/18 1719  TSH 0.424   Anemia Panel: No results for input(s): VITAMINB12, FOLATE, FERRITIN, TIBC, IRON, RETICCTPCT in the last 72 hours. Sepsis Labs: No results for input(s): PROCALCITON, LATICACIDVEN in the last 168 hours.  Recent Results (from the past 240 hour(s))  SARS Coronavirus 2 (CEPHEID- Performed in [place] hospital lab), Hosp Order     Status: None   Collection Time: 08/10/18 12:41 PM   Specimen: Nasopharyngeal Swab  Result Value Ref Range Status   SARS Coronavirus 2 NEGATIVE NEGATIVE Final    Comment: (NOTE) If result is NEGATIVE SARS-CoV-2 target nucleic acids are NOT DETECTED. The SARS-CoV-2 RNA is generally detectable in upper and lower  respiratory specimens during the acute phase of infection. The lowest  concentration of SARS-CoV-2 viral copies this assay can detect is 250  copies / mL. A negative result does not preclude SARS-CoV-2 infection  and should not be used as the sole basis for treatment or other  patient management decisions.  A negative result may occur with  improper specimen collection / handling, submission of specimen other  than nasopharyngeal swab, presence of viral mutation(s) within the  areas targeted by this assay, and inadequate number of viral copies  (<250 copies / mL). A negative result must be combined with clinical  observations, patient history, and epidemiological information. If result is POSITIVE SARS-CoV-2 target nucleic acids are DETECTED. The SARS-CoV-2 RNA is generally detectable in upper and lower  respiratory specimens dur ing the acute phase of infection.  Positive  results are indicative of active infection with SARS-CoV-2.  Clinical  correlation  with patient history and other diagnostic information is  necessary to determine patient infection status.  Positive results do  not rule out bacterial infection or co-infection with other viruses. If result is PRESUMPTIVE POSTIVE SARS-CoV-2 nucleic acids MAY BE PRESENT.   A presumptive positive result was obtained on the submitted specimen  and confirmed on repeat testing.  While 2019 novel coronavirus  (SARS-CoV-2) nucleic acids may be present in the submitted sample  additional confirmatory testing may be necessary for epidemiological  and / or clinical management purposes  to differentiate between  SARS-CoV-2 and other Sarbecovirus currently known to infect humans.  If clinically indicated additional testing with an alternate test  methodology [phone]) is advised. The SARS-CoV-2 RNA is generally  detectable in upper and lower respiratory sp ecimens during the acute  phase of infection. The expected result is Negative. Fact Sheet for Patients:  StrictlyIdeas.no Fact Sheet for Healthcare Providers: BankingDealers.co.za This test is not yet approved or cleared by the [place] FDA and has been authorized for detection and/or diagnosis of  SARS-CoV-2 by FDA under an Emergency Use Authorization (EUA).  This EUA will remain in effect (meaning this test can be used) for the duration of the COVID-19 declaration under Section 564(b)(1) of the Act, 21 U.S.C. section 360bbb-3(b)(1), unless the authorization is terminated or revoked sooner. Performed at [facility], [place] [address], [place], [place] [address]          Radiology Studies: Dg Abd 1 View  Result Date: 08/11/2018 CLINICAL DATA:  Abdominal pain and headache. EXAM: ABDOMEN - 1 VIEW COMPARISON:  CT abdomen and pelvis 05/15/2018. FINDINGS: The bowel gas pattern is normal. No radio-opaque calculi or other significant radiographic abnormality are seen.  IMPRESSION: Negative exam. Electronically Signed   By: Inge Rise M.D.   On: 08/11/2018 11:25   Dg Chest Portable 1 View  Result Date: 08/10/2018 CLINICAL DATA:  Shortness of breath for 2 days.  History of COPD. EXAM: PORTABLE CHEST 1 VIEW COMPARISON:  Radiographs 07/20/2018 and 06/27/2018. FINDINGS: 1309 hours. The heart size and mediastinal contours are stable. The lungs are hyperinflated consistent with emphysema. No superimposed edema, airspace disease, pleural effusion or pneumothorax identified. Telemetry leads overlie the chest. There are old upper rib fractures on the right. No acute osseous findings. IMPRESSION: Stable chest without evidence of acute cardiopulmonary process. Emphysema. Electronically Signed   By: Richardean Sale M.D.   On: 08/10/2018 13:59        Scheduled Meds:  ARIPiprazole  5 mg Oral Daily   enoxaparin (LOVENOX) injection  40 mg Subcutaneous Q24H   escitalopram  20 mg Oral Daily   folic acid  1 mg Oral Daily   insulin aspart  0-5 Units Subcutaneous QHS   insulin aspart  0-9 Units Subcutaneous TID WC   ipratropium-albuterol  3 mL Nebulization TID   mouth rinse  15 mL Mouth Rinse BID   methylPREDNISolone (SOLU-MEDROL) injection  60 mg Intravenous Q6H   mometasone-formoterol  2 puff Inhalation BID   polyethylene glycol  17 g Oral Daily   pravastatin  40 mg Oral q1800   senna-docusate  1 tablet Oral BID   tamsulosin  0.4 mg Oral Daily   thiamine  100 mg Oral Daily   Continuous Infusions:  sodium chloride 75 mL/hr at 08/11/18 0643   azithromycin 500 mg (08/10/18 1729)     LOS: 0 days    Time spent: 36 min    Hosie Poisson, MD [facility] Pager [phone]  If 7PM-7AM, please contact night-coverage [email or web address] Password [facility] 08/11/2018, 12:26 PM

## 2018-08-12 ENCOUNTER — Ambulatory Visit: Payer: Medicaid Other

## 2018-08-12 DIAGNOSIS — R531 Weakness: Secondary | ICD-10-CM

## 2018-08-12 DIAGNOSIS — R0609 Other forms of dyspnea: Secondary | ICD-10-CM

## 2018-08-12 DIAGNOSIS — Z515 Encounter for palliative care: Secondary | ICD-10-CM

## 2018-08-12 DIAGNOSIS — R0603 Acute respiratory distress: Secondary | ICD-10-CM

## 2018-08-12 DIAGNOSIS — Z66 Do not resuscitate: Secondary | ICD-10-CM

## 2018-08-12 LAB — GLUCOSE, CAPILLARY
Glucose-Capillary: 151 mg/dL — ABNORMAL HIGH (ref 70–99)
Glucose-Capillary: 158 mg/dL — ABNORMAL HIGH (ref 70–99)
Glucose-Capillary: 226 mg/dL — ABNORMAL HIGH (ref 70–99)
Glucose-Capillary: 80 mg/dL (ref 70–99)

## 2018-08-12 MED ORDER — PHENOL 1.4 % MT LIQD
1.0000 | OROMUCOSAL | Status: DC | PRN
  Filled 2018-08-12: qty 177

## 2018-08-12 NOTE — Progress Notes (Signed)
PROGRESS NOTE    DEAUNTE [name]  [name]:[id number] DOB: 1955/04/15 DOA: 08/10/2018 PCP: Taylor Perna, NP    Brief Narrative:  Taylor [name] is a 63 y.o. male with medical history significant of chronic hypoxic respiratory failure on 3 L of nasal cannula oxygen at home prostate cancer, COPD, recurrent admissions for COPD exacerbation, bipolar disorder, asthma, tobacco abuse presents with worsening shortness of breath with diffuse wheezing since 2 to 3 days. He was admitted for COPD exacerbation and hyponatremia.  He was seen by palliative care and planning for hospice upon discharge.  Subjective  Patient reports he still very short of breath, very tight with wheezing.  Currently on home oxygen setting. He feels unwell, weak.  Assessment & Plan:  COPD with acute exacerbation: Patient with multiple recurrent admissions for COPD exacerbations.  Significant bilateral diminished breath sounds and wheezing noted and is very deconditioned.  Continue on IV steroids aggressive bronchodilators.  Acute on chronic hypoxic respiratory failure due to #1 continue supplemental oxygen as above.  Hyponatremia: Improved with hydration.  Debility/deconditioning/recurrent hospitalization: In the setting of respiratory failure COPD palliative care consulted and planning on home with hospice upon discharge.   Diabetes mellitus with hyperglycemia: Continue sliding scale insulin and monitor blood sugar closely.  Bipolar disorder: Cont home abilify and Lexapro.    DVT prophylaxis: lovenox.  Code Status: DNR Family Communication: NONE at bedside.  Disposition Plan: Planning home with hospice.  Anticipating possible discharge in 24 hours if remains better and symptomatically improving   Consultants:   Palliative care consult.   Procedures:  None.   Antimicrobials:azithromycin since admission.   Objective: Vitals:   08/12/18 0759 08/12/18 1131 08/12/18 1336 08/12/18 1408  BP:     132/70  Pulse: 88 92 95 93  Resp: 16 16 16 17   Temp:    97.8 F (36.6 C)  TempSrc:    Oral  SpO2: 98% 97% 95% 97%  Weight:      Height:        Intake/Output Summary (Last 24 hours) at 08/12/2018 1640 Last data filed at 08/12/2018 1409 Gross per 24 hour  Intake 2936.41 ml  Output 1300 ml  Net 1636.41 ml   Filed Weights   08/10/18 1951  Weight: 64.9 kg    Examination:  General exam: Chronically sick looking alert awake, mild-moderate distress HEENT:Oral mucosa moist, Ear/Nose WNL grossly, dentition normal. Respiratory system: Bilateral diminished breath sound severe, with expiratory wheezing, mild use of accessory muscle.   Cardiovascular system: regular rate and rhythm, S1 & S2 heard, No JVD/murmurs. Gastrointestinal system: Abdomen soft, non-tender, non-distended, BS +. No hepatosplenomegaly palpable. Nervous System:Alert, awake and oriented at baseline. Able to move UE and LE, sensation intact. Extremities: No edema, distal peripheral pulses palpable.  Skin: No rashes,no icterus. MSK: Normal muscle bulk,tone, power  Data Reviewed: I have personally reviewed following labs and imaging studies  CBC: Recent Labs  Lab 08/10/18 1231 08/11/18 0552  WBC 5.3 1.7*  HGB 12.6* 11.8*  HCT 38.1* 34.7*  MCV 92.3 91.6  PLT 192 557   Basic Metabolic Panel: Recent Labs  Lab 08/10/18 1231 08/11/18 0552  NA 122* 130*  K 4.4 4.3  CL 77* 87*  CO2 31 34*  GLUCOSE 65* 215*  BUN 13 7*  CREATININE 0.42* 0.43*  CALCIUM 7.8* 8.3*   GFR: Estimated Creatinine Clearance: 86.8 mL/min (A) (by C-G formula based on SCr of 0.43 mg/dL (L)). Liver Function Tests: No results for input(s): AST, ALT, ALKPHOS, BILITOT,  PROT, ALBUMIN in the last 168 hours. No results for input(s): LIPASE, AMYLASE in the last 168 hours. No results for input(s): AMMONIA in the last 168 hours. Coagulation Profile: No results for input(s): INR, PROTIME in the last 168 hours. Cardiac Enzymes: No results for  input(s): CKTOTAL, CKMB, CKMBINDEX, TROPONINI in the last 168 hours. BNP (last 3 results) No results for input(s): PROBNP in the last 8760 hours. HbA1C: Recent Labs    08/11/18 0552  HGBA1C 5.8*   CBG: Recent Labs  Lab 08/11/18 1212 08/11/18 1716 08/11/18 2025 08/12/18 0758 08/12/18 1135  GLUCAP 265* 181* 118* 151* 158*   Lipid Profile: No results for input(s): CHOL, HDL, LDLCALC, TRIG, CHOLHDL, LDLDIRECT in the last 72 hours. Thyroid Function Tests: Recent Labs    08/10/18 1719  TSH 0.424   Anemia Panel: No results for input(s): VITAMINB12, FOLATE, FERRITIN, TIBC, IRON, RETICCTPCT in the last 72 hours. Sepsis Labs: No results for input(s): PROCALCITON, LATICACIDVEN in the last 168 hours.  Recent Results (from the past 240 hour(s))  SARS Coronavirus 2 (CEPHEID- Performed in [place] hospital lab), Hosp Order     Status: None   Collection Time: 08/10/18 12:41 PM   Specimen: Nasopharyngeal Swab  Result Value Ref Range Status   SARS Coronavirus 2 NEGATIVE NEGATIVE Final    Comment: (NOTE) If result is NEGATIVE SARS-CoV-2 target nucleic acids are NOT DETECTED. The SARS-CoV-2 RNA is generally detectable in upper and lower  respiratory specimens during the acute phase of infection. The lowest  concentration of SARS-CoV-2 viral copies this assay can detect is 250  copies / mL. A negative result does not preclude SARS-CoV-2 infection  and should not be used as the sole basis for treatment or other  patient management decisions.  A negative result may occur with  improper specimen collection / handling, submission of specimen other  than nasopharyngeal swab, presence of viral mutation(s) within the  areas targeted by this assay, and inadequate number of viral copies  (<250 copies / mL). A negative result must be combined with clinical  observations, patient history, and epidemiological information. If result is POSITIVE SARS-CoV-2 target nucleic acids are DETECTED. The  SARS-CoV-2 RNA is generally detectable in upper and lower  respiratory specimens dur ing the acute phase of infection.  Positive  results are indicative of active infection with SARS-CoV-2.  Clinical  correlation with patient history and other diagnostic information is  necessary to determine patient infection status.  Positive results do  not rule out bacterial infection or co-infection with other viruses. If result is PRESUMPTIVE POSTIVE SARS-CoV-2 nucleic acids MAY BE PRESENT.   A presumptive positive result was obtained on the submitted specimen  and confirmed on repeat testing.  While 2019 novel coronavirus  (SARS-CoV-2) nucleic acids may be present in the submitted sample  additional confirmatory testing may be necessary for epidemiological  and / or clinical management purposes  to differentiate between  SARS-CoV-2 and other Sarbecovirus currently known to infect humans.  If clinically indicated additional testing with an alternate test  methodology [phone]) is advised. The SARS-CoV-2 RNA is generally  detectable in upper and lower respiratory sp ecimens during the acute  phase of infection. The expected result is Negative. Fact Sheet for Patients:  StrictlyIdeas.no Fact Sheet for Healthcare Providers: BankingDealers.co.za This test is not yet approved or cleared by the [place] FDA and has been authorized for detection and/or diagnosis of SARS-CoV-2 by FDA under an Emergency Use Authorization (EUA).  This EUA will  remain in effect (meaning this test can be used) for the duration of the COVID-19 declaration under Section 564(b)(1) of the Act, 21 U.S.C. section 360bbb-3(b)(1), unless the authorization is terminated or revoked sooner. Performed at [facility], [place] [address] [place], [place] [address]          Radiology Studies: Dg Abd 1 View  Result Date: 08/11/2018 CLINICAL DATA:  Abdominal pain  and headache. EXAM: ABDOMEN - 1 VIEW COMPARISON:  CT abdomen and pelvis 05/15/2018. FINDINGS: The bowel gas pattern is normal. No radio-opaque calculi or other significant radiographic abnormality are seen. IMPRESSION: Negative exam. Electronically Signed   By: Inge Rise M.D.   On: 08/11/2018 11:25        Scheduled Meds: . ARIPiprazole  5 mg Oral Daily  . enoxaparin (LOVENOX) injection  40 mg Subcutaneous Q24H  . escitalopram  20 mg Oral Daily  . folic acid  1 mg Oral Daily  . insulin aspart  0-5 Units Subcutaneous QHS  . insulin aspart  0-9 Units Subcutaneous TID WC  . ipratropium-albuterol  3 mL Nebulization TID  . mouth rinse  15 mL Mouth Rinse BID  . methylPREDNISolone (SOLU-MEDROL) injection  60 mg Intravenous Q6H  . mometasone-formoterol  2 puff Inhalation BID  . polyethylene glycol  17 g Oral Daily  . pravastatin  40 mg Oral q1800  . senna-docusate  1 tablet Oral BID  . tamsulosin  0.4 mg Oral Daily  . thiamine  100 mg Oral Daily   Continuous Infusions: . sodium chloride 75 mL/hr at 08/11/18 2025  . azithromycin 500 mg (08/11/18 1716)     LOS: 1 day    Time spent: 59 min  Antonieta Pert, MD [facility]  If 7PM-7AM, please contact night-coverage [email or web address] Password [facility] 08/12/2018, 4:40 PM

## 2018-08-12 NOTE — Progress Notes (Signed)
Patient ID: NORTON BIVINS, male   DOB: [birth year], 64 y.o.   MRN: [id number]  This NP visited patient at the bedside as a follow up to  yesterday's Arthur.  Patient is weak and dyspneic at rest.  We discussed that he will be receiving services from his hospice benefit when discharged.  He verbalizes his gratitude for this.  Discussed with patient the importance of continued conversation with  his medical providers regarding overall plan of care and treatment options,  ensuring decisions are within the context of the patients values and GOCs.  Encouraged Mr Tift to further document his advanced care planning.  Introduced MOST form.    A detailed discussion was had today regarding advanced directives.  Concepts specific to code status, artifical feeding and hydration, continued IV antibiotics and rehospitalization was had.  The difference between a aggressive medical intervention path  and a palliative comfort care path for this patient at this time was had.  Values and goals of care important to patient and family were attempted to be elicited.  Emotional support offered.   Questions and concerns addressed.   Patient encouraged to call with questions or concerns.    PMT will continue to support holistically    Total time spent on the unit was  25 minutes  Discussed with CMRN  Greater than 50% of the time was spent in counseling and coordination of care  Wadie Lessen NP  Palliative Medicine Team Team Phone # [phone] Pager [phone]

## 2018-08-12 NOTE — TOC Progression Note (Signed)
Transition of Care [facility]) - Progression Note    Patient Details  Name: Taylor [name] MRN: [id number] Date of Birth: 01/29/1955  Transition of Care [facility]) CM/SW Contact  Madlynn Lundeen, Juliann Pulse, RN Phone Number: 08/12/2018, 11:04 AM  Clinical Narrative:Patient has been accepted by [facility] office rep [phone], she can pull the d/c summary. DNR in shadow chart for MD signature. Has [facility] 3.5l continuous-[facility] care wil switch out once @ home. PTAR needed for non emergency transportation-confirmed address on face sheet.     Expected Discharge Plan: Home w Hospice Care Barriers to Discharge: Continued Medical Work up  Expected Discharge Plan and Services Expected Discharge Plan: [place]   Discharge Planning Services: CM Consult Post Acute Care Choice: Hospice Living arrangements for the past 2 months: Mobile Home                                       Social Determinants of Health (SDOH) Interventions    Readmission Risk Interventions Readmission Risk Prevention Plan 08/11/2018 07/24/2018 06/30/2018  Transportation Screening Complete Complete Complete  Medication Review Press photographer) Complete Complete Complete  PCP or Specialist appointment within 3-5 days of discharge Complete Complete Complete  HRI or Home Care Consult Complete Complete Complete  SW Recovery Care/Counseling Consult Complete Complete Complete  SW Consult Not Complete Comments - - -  Palliative Care Screening Complete Complete Not [place] Not Applicable Not Complete Not Applicable  Some recent data might be hidden

## 2018-08-13 ENCOUNTER — Ambulatory Visit: Payer: Medicaid Other

## 2018-08-13 LAB — GLUCOSE, CAPILLARY: Glucose-Capillary: 173 mg/dL — ABNORMAL HIGH (ref 70–99)

## 2018-08-13 MED ORDER — THIAMINE HCL 100 MG PO TABS: 100.0000 mg | ORAL_TABLET | Freq: Every day | ORAL | 0 refills | Status: AC

## 2018-08-13 MED ORDER — ESCITALOPRAM OXALATE 20 MG PO TABS: 20.0000 mg | ORAL_TABLET | Freq: Every day | ORAL | 0 refills | Status: AC

## 2018-08-13 MED ORDER — METFORMIN HCL 1000 MG PO TABS: 1000.0000 mg | ORAL_TABLET | Freq: Two times a day (BID) | ORAL | 0 refills | Status: AC

## 2018-08-13 MED ORDER — SODIUM CHLORIDE 0.9 % IV BOLUS
500.0000 mL | Freq: Once | INTRAVENOUS | Status: AC
  Administered 2018-08-13: 07:00:00 500 mL via INTRAVENOUS

## 2018-08-13 MED ORDER — TAMSULOSIN HCL 0.4 MG PO CAPS: 0.4000 mg | ORAL_CAPSULE | Freq: Every day | ORAL | 0 refills | Status: AC

## 2018-08-13 MED ORDER — FOLIC ACID 1 MG PO TABS: 1.0000 mg | ORAL_TABLET | Freq: Every day | ORAL | 0 refills | Status: AC

## 2018-08-13 MED ORDER — LOVASTATIN 20 MG PO TABS: 20.0000 mg | ORAL_TABLET | Freq: Every day | ORAL | 0 refills | Status: AC

## 2018-08-13 MED ORDER — PREDNISONE 10 MG PO TABS: ORAL_TABLET | ORAL | 0 refills | Status: AC

## 2018-08-13 MED ORDER — HYDROCOD POLST-CPM POLST ER 10-8 MG/5ML PO SUER
5.0000 mL | Freq: Once | ORAL | Status: AC
  Administered 2018-08-13: 5 mL via ORAL
  Filled 2018-08-13: qty 5

## 2018-08-13 MED ORDER — LORAZEPAM 0.5 MG PO TABS: 0.5000 mg | ORAL_TABLET | Freq: Three times a day (TID) | ORAL | 0 refills | Status: AC | PRN

## 2018-08-13 MED ORDER — ARIPIPRAZOLE 5 MG PO TABS: 5.0000 mg | ORAL_TABLET | Freq: Every day | ORAL | 0 refills | Status: AC

## 2018-08-13 NOTE — Discharge Instructions (Signed)
Chronic Obstructive Pulmonary Disease °Chronic obstructive pulmonary disease (COPD) is a long-term (chronic) lung problem. When you have COPD, it is hard for air to get in and out of your lungs. Usually the condition gets worse over time, and your lungs will never return to normal. There are things you can do to keep yourself as healthy as possible. °· Your doctor may treat your condition with: °? Medicines. °? Oxygen. °? Lung surgery. °· Your doctor may also recommend: °? Rehabilitation. This includes steps to make your body work better. It may involve a team of specialists. °? Quitting smoking, if you smoke. °? Exercise and changes to your diet. °? Comfort measures (palliative care). °Follow these instructions at home: °Medicines °· Take over-the-counter and prescription medicines only as told by your doctor. °· Talk to your doctor before taking any cough or allergy medicines. You may need to avoid medicines that cause your lungs to be dry. °Lifestyle °· If you smoke, stop. Smoking makes the problem worse. If you need help quitting, ask your doctor. °· Avoid being around things that make your breathing worse. This may include smoke, chemicals, and fumes. °· Stay active, but remember to rest as well. °· Learn and use tips on how to relax. °· Make sure you get enough sleep. Most adults need at least 7 hours of sleep every night. °· Eat healthy foods. Eat smaller meals more often. Rest before meals. °Controlled breathing °Learn and use tips on how to control your breathing as told by your doctor. Try: °· Breathing in (inhaling) through your nose for 1 second. Then, pucker your lips and breath out (exhale) through your lips for 2 seconds. °· Putting one hand on your belly (abdomen). Breathe in slowly through your nose for 1 second. Your hand on your belly should move out. Pucker your lips and breathe out slowly through your lips. Your hand on your belly should move in as you breathe out. ° °Controlled coughing °Learn  and use controlled coughing to clear mucus from your lungs. Follow these steps: °1. Lean your head a little forward. °2. Breathe in deeply. °3. Try to hold your breath for 3 seconds. °4. Keep your mouth slightly open while coughing 2 times. °5. Spit any mucus out into a tissue. °6. Rest and do the steps again 1 or 2 times as needed. °General instructions °· Make sure you get all the shots (vaccines) that your doctor recommends. Ask your doctor about a flu shot and a pneumonia shot. °· Use oxygen therapy and pulmonary rehabilitation if told by your doctor. If you need home oxygen therapy, ask your doctor if you should buy a tool to measure your oxygen level (oximeter). °· Make a COPD action plan with your doctor. This helps you to know what to do if you feel worse than usual. °· Manage any other conditions you have as told by your doctor. °· Avoid going outside when it is very hot, cold, or humid. °· Avoid people who have a sickness you can catch (contagious). °· Keep all follow-up visits as told by your doctor. This is important. °Contact a doctor if: °· You cough up more mucus than usual. °· There is a change in the color or thickness of the mucus. °· It is harder to breathe than usual. °· Your breathing is faster than usual. °· You have trouble sleeping. °· You need to use your medicines more often than usual. °· You have trouble doing your normal activities such as getting dressed   or walking around the house. °Get help right away if: °· You have shortness of breath while resting. °· You have shortness of breath that stops you from: °? Being able to talk. °? Doing normal activities. °· Your chest hurts for longer than 5 minutes. °· Your skin color is more blue than usual. °· Your pulse oximeter shows that you have low oxygen for longer than 5 minutes. °· You have a fever. °· You feel too tired to breathe normally. °Summary °· Chronic obstructive pulmonary disease (COPD) is a long-term lung problem. °· The way your  lungs work will never return to normal. Usually the condition gets worse over time. There are things you can do to keep yourself as healthy as possible. °· Take over-the-counter and prescription medicines only as told by your doctor. °· If you smoke, stop. Smoking makes the problem worse. °This information is not intended to replace advice given to you by your health care provider. Make sure you discuss any questions you have with your health care provider. °Document Released: 06/26/2007 Document Revised: 12/20/2016 Document Reviewed: 02/12/2016 °Elsevier Patient Education © 2020 [employer] Inc. ° °

## 2018-08-13 NOTE — Progress Notes (Signed)
Patient discharging home.  IV removed - WNL.  Reviewed AVS and medications, instructed to follow up with PCP, Hospice Home to follow.  Verbalizes understanding.  No questions at this time.  PTAR here to transport.

## 2018-08-13 NOTE — TOC Transition Note (Signed)
Transition of Care [facility]) - CM/SW Discharge Note   Patient Details  Name: Taylor [name] MRN: [id number] Date of Birth: 1955-02-14  Transition of Care [facility] CM/SW Contact:  Lia Hopping, LCSW Phone Number: 08/13/2018, 10:51 AM   Clinical Narrative:    CSW notified Debra with [facility] office patient is discharging today.  CSW arranged PTAR transportation with O2.    Final next level of care: Home w Hospice Care Barriers to Discharge: Continued Medical Work up   Patient Goals and CMS Choice Patient states their goals for this hospitalization and ongoing recovery are:: go home CMS Medicare.gov Compare Post Acute Care list provided to:: Patient Choice offered to / list presented to : Patient, Sibling  Discharge Placement  Home                     Discharge Plan and Services   Discharge Planning Services: CM Consult Post Acute Care Choice: Hospice                               Social Determinants of Health (SDOH) Interventions     Readmission Risk Interventions Readmission Risk Prevention Plan 08/11/2018 07/24/2018 06/30/2018  Transportation Screening Complete Complete Complete  Medication Review Press photographer) Complete Complete Complete  PCP or Specialist appointment within 3-5 days of discharge Complete Complete Complete  HRI or Home Care Consult Complete Complete Complete  SW Recovery Care/Counseling Consult Complete Complete Complete  SW Consult Not Complete Comments - - -  Palliative Care Screening Complete Complete Not [place] Not Applicable Not Complete Not Applicable  Some recent data might be hidden

## 2018-08-13 NOTE — Progress Notes (Signed)
   08/13/18 0600  Vitals  Temp 98.2 F (36.8 C)  Temp Source Oral  BP (!) 142/86  MAP (mmHg) 103  BP Location Right Arm  BP Method Automatic  Patient Position (if appropriate) Sitting  Pulse Rate (!) 122  Pulse Rate Source Dinamap  Resp (!) 26  Oxygen Therapy  SpO2 92 %  O2 Device Nasal Cannula  O2 Flow Rate (L/min) 3 L/min  MEWS Score  MEWS RR 2  MEWS Pulse 2  MEWS Systolic 0  MEWS LOC 0  MEWS Temp 0  MEWS Score 4  MEWS Score Color Red   Pt reports no SOB or discomfort although HR sustained in 130's. Sp02 between 88-92 on 3L. On call notified and orders received.

## 2018-08-13 NOTE — Discharge Summary (Signed)
Physician Discharge Summary  Taylor [name] ZDG:[id number] DOB: [birth year] DOA: 08/10/2018  PCP: [name], NP  Admit date: 08/10/2018 Discharge date: 08/13/2018  Admitted From: Home Disposition: Home with hospice  Recommendations for Outpatient Follow-up:  1. Follow up with PCP/hospice team at home.   Home Health: Hospice at home Equipment/Devices: oxygen-home setting   Discharge Condition: Stable CODE STATUS: FULL Diet recommendation: Heart Healthy  Brief/Interim Summary:  63 y.o.malewith medical history significant ofchronic hypoxic respiratory failure on 3 L of nasal cannula oxygen at home prostate cancer, COPD, recurrent admissions for COPD exacerbation, bipolar disorder, asthma, tobacco abuse presents with worsening shortness of breath with diffuse wheezing since 2 to 3 days. He was admitted for COPD exacerbation and hyponatremia.  He was seen by palliative care and planning for hospice upon discharge. Patient overall shortness of breath is better, he is on home oxygen setting will be discharged with oral steroid taper.  At this time is being discharged home with hospice care given his overall morbidities severe COPD and multiple hospitalization and respiratory failure.  Assessment & Plan:  COPD with acute exacerbation: Patient with multiple recurrent admissions for COPD exacerbations.    Continue bronchodilators oral steroids taper along with Ativan at home with hospice.\  Acute on chronic hypoxic respiratory failure due to #1 continue supplemental oxygen as above.  And he is on home setting.  Hyponatremia: Improved with hydration.  Debility/deconditioning/recurrent hospitalization: In the setting of respiratory failure COPD palliative care consulted and planning on home with hospice upon discharge.   Diabetes mellitus with hyperglycemia: Continue home medication on discharge.   Bipolar disorder: Cont home abilify and Lexapro.    DVT prophylaxis:  lovenox.  Code Status: DNR Family Communication: NONE at bedside.  Disposition Plan: home with hospice.  Consultants:   Palliative care consult.   Procedures:  None.   Discharge Diagnoses:  Active Problems:   COPD exacerbation ([place])   COPD with acute exacerbation ([place])   Dyspnea   Hyponatremia   Weakness generalized    Discharge Instructions  Discharge Instructions    Diet - low sodium heart healthy   Complete by: As directed    Discharge instructions   Complete by: As directed    Follow-up with hospice team at home   Increase activity slowly   Complete by: As directed      Allergies as of 08/13/2018      Reactions   Codeine Nausea And Vomiting      Medication List    TAKE these medications   Accu-Chek Aviva device Use as instructed   accu-chek soft touch lancets 1 each by Other route 2 (two) times daily. Use as instructed   albuterol 108 (90 Base) MCG/ACT inhaler Commonly known as: Proventil HFA Inhale 2 puffs into the lungs every 6 (six) hours as needed for wheezing or shortness of breath.   albuterol (2.5 MG/3ML) 0.083% nebulizer solution Commonly known as: PROVENTIL Take 3 mLs (2.5 mg total) by nebulization every 4 (four) hours as needed for wheezing or shortness of breath (if you can't catch your breath).   ARIPiprazole 5 MG tablet Commonly known as: ABILIFY Take 1 tablet (5 mg total) by mouth daily for 30 days.   escitalopram 20 MG tablet Commonly known as: LEXAPRO Take 1 tablet (20 mg total) by mouth daily for 30 days.   folic acid 1 MG tablet Commonly known as: FOLVITE Take 1 tablet (1 mg total) by mouth daily.   glucose blood test strip Commonly known  as: Accu-Chek Aviva 1 each by Other route as needed for other. Use as instructed   LORazepam 0.5 MG tablet Commonly known as: ATIVAN Take 1 tablet (0.5 mg total) by mouth every 8 (eight) hours as needed for up to 4 days for anxiety.   lovastatin 20 MG tablet Commonly known as:  MEVACOR Take 1 tablet (20 mg total) by mouth at bedtime for 30 days.   metFORMIN 1000 MG tablet Commonly known as: GLUCOPHAGE Take 1 tablet (1,000 mg total) by mouth 2 (two) times daily with a meal.   mometasone-formoterol 100-5 MCG/ACT Aero Commonly known as: DULERA Inhale 2 puffs into the lungs 2 (two) times daily.   OXYGEN Inhale 3 L into the lungs continuous.   predniSONE 10 MG tablet Commonly known as: DELTASONE Take 4 tabs dailyx3 days, Take 3 tabs dailyx3 days, Take 2 tabs dailyx3 days, Take 1 tab dailyx3 days then stop   tamsulosin 0.4 MG Caps capsule Commonly known as: FLOMAX Take 1 capsule (0.4 mg total) by mouth daily.   thiamine 100 MG tablet Take 1 tablet (100 mg total) by mouth daily.   tiotropium 18 MCG inhalation capsule Commonly known as: SPIRIVA Place 1 capsule (18 mcg total) into inhaler and inhale daily.      Follow-up Information    [place] office Follow up.   Why: they will call for home visit Contact information: [place] [address] tel#[phone] [place], NP Follow up.   Specialty: Internal Medicine Contact information: [address] [place] [address] [phone]          Allergies  Allergen Reactions  . Codeine Nausea And Vomiting    Procedures/Studies: Dg Chest 1 View  Result Date: 07/20/2018 CLINICAL DATA:  Shortness of breath EXAM: CHEST  1 VIEW COMPARISON:  06/27/2018 FINDINGS: Cardiac shadows within normal limits. The lungs are hyperinflated without focal infiltrate, effusion or pneumothorax. Old rib fractures are seen on the right. No acute abnormality noted. IMPRESSION: COPD without acute abnormality.  No change from the prior exam. Electronically Signed   By: Inez Catalina M.D.   On: 07/20/2018 12:25   Dg Abd 1 View  Result Date: 08/11/2018 CLINICAL DATA:  Abdominal pain and headache. EXAM: ABDOMEN - 1 VIEW COMPARISON:  CT abdomen and pelvis 05/15/2018.  FINDINGS: The bowel gas pattern is normal. No radio-opaque calculi or other significant radiographic abnormality are seen. IMPRESSION: Negative exam. Electronically Signed   By: Inge Rise M.D.   On: 08/11/2018 11:25   Dg Chest Portable 1 View  Result Date: 08/10/2018 CLINICAL DATA:  Shortness of breath for 2 days.  History of COPD. EXAM: PORTABLE CHEST 1 VIEW COMPARISON:  Radiographs 07/20/2018 and 06/27/2018. FINDINGS: 1309 hours. The heart size and mediastinal contours are stable. The lungs are hyperinflated consistent with emphysema. No superimposed edema, airspace disease, pleural effusion or pneumothorax identified. Telemetry leads overlie the chest. There are old upper rib fractures on the right. No acute osseous findings. IMPRESSION: Stable chest without evidence of acute cardiopulmonary process. Emphysema. Electronically Signed   By: Richardean Sale M.D.   On: 08/10/2018 13:59    (Echo, Carotid, EGD, Colonoscopy, ERCP)    Subjective: Feels much better today.  Home oxygen setting. Denies chest pain shortness of breath fever or chills. Discharge Exam: Vitals:   08/13/18 0728 08/13/18 0833  BP:  (!) 128/99  Pulse:  84  Resp:  20  Temp:  97.6 F (  36.4 C)  SpO2: 96% 95%   Vitals:   08/12/18 2247 08/13/18 0600 08/13/18 0728 08/13/18 0833  BP:  (!) 142/86  (!) 128/99  Pulse:  (!) 122  84  Resp:  (!) 26  20  Temp:  98.2 F (36.8 C)  97.6 F (36.4 C)  TempSrc:  Oral  Oral  SpO2: 95% 92% 96% 95%  Weight:      Height:        General: Pt is alert, awake, not in acute distress Cardiovascular: RRR, S1/S2 +, no rubs, no gallops Respiratory: Bilateral diminished breath sounds with mild wheezing.no use of accessory muscle  Abdominal: Soft, NT, ND, bowel sounds + Extremities: no edema, no cyanosis   The results of significant diagnostics from this hospitalization (including imaging, microbiology, ancillary and laboratory) are listed below for reference.      Microbiology: Recent Results (from the past 240 hour(s))  SARS Coronavirus 2 (CEPHEID- Performed in [facility] hospital lab), Hosp Order     Status: None   Collection Time: 08/10/18 12:41 PM   Specimen: Nasopharyngeal Swab  Result Value Ref Range Status   SARS Coronavirus 2 NEGATIVE NEGATIVE Final    Comment: (NOTE) If result is NEGATIVE SARS-CoV-2 target nucleic acids are NOT DETECTED. The SARS-CoV-2 RNA is generally detectable in upper and lower  respiratory specimens during the acute phase of infection. The lowest  concentration of SARS-CoV-2 viral copies this assay can detect is 250  copies / mL. A negative result does not preclude SARS-CoV-2 infection  and should not be used as the sole basis for treatment or other  patient management decisions.  A negative result may occur with  improper specimen collection / handling, submission of specimen other  than nasopharyngeal swab, presence of viral mutation(s) within the  areas targeted by this assay, and inadequate number of viral copies  (<250 copies / mL). A negative result must be combined with clinical  observations, patient history, and epidemiological information. If result is POSITIVE SARS-CoV-2 target nucleic acids are DETECTED. The SARS-CoV-2 RNA is generally detectable in upper and lower  respiratory specimens dur ing the acute phase of infection.  Positive  results are indicative of active infection with SARS-CoV-2.  Clinical  correlation with patient history and other diagnostic information is  necessary to determine patient infection status.  Positive results do  not rule out bacterial infection or co-infection with other viruses. If result is PRESUMPTIVE POSTIVE SARS-CoV-2 nucleic acids MAY BE PRESENT.   A presumptive positive result was obtained on the submitted specimen  and confirmed on repeat testing.  While 2019 novel coronavirus  (SARS-CoV-2) nucleic acids may be present in the submitted sample   additional confirmatory testing may be necessary for epidemiological  and / or clinical management purposes  to differentiate between  SARS-CoV-2 and other Sarbecovirus currently known to infect humans.  If clinically indicated additional testing with an alternate test  methodology [phone]) is advised. The SARS-CoV-2 RNA is generally  detectable in upper and lower respiratory sp ecimens during the acute  phase of infection. The expected result is Negative. Fact Sheet for Patients:  StrictlyIdeas.no Fact Sheet for Healthcare Providers: BankingDealers.co.za This test is not yet approved or cleared by the [place] FDA and has been authorized for detection and/or diagnosis of SARS-CoV-2 by FDA under an Emergency Use Authorization (EUA).  This EUA will remain in effect (meaning this test can be used) for the duration of the COVID-19 declaration under Section 564(b)(1) of the Act,  21 U.S.C. section 360bbb-3(b)(1), unless the authorization is terminated or revoked sooner. Performed at [facility], [place] [address] [place], [place] [address]      Labs: BNP (last 3 results) Recent Labs    05/01/18 2019 05/25/18 2021 07/20/18 1130  BNP 45.6 81.3 77.9   Basic Metabolic Panel: Recent Labs  Lab 08/10/18 1231 08/11/18 0552  NA 122* 130*  K 4.4 4.3  CL 77* 87*  CO2 31 34*  GLUCOSE 65* 215*  BUN 13 7*  CREATININE 0.42* 0.43*  CALCIUM 7.8* 8.3*   Liver Function Tests: No results for input(s): AST, ALT, ALKPHOS, BILITOT, PROT, ALBUMIN in the last 168 hours. No results for input(s): LIPASE, AMYLASE in the last 168 hours. No results for input(s): AMMONIA in the last 168 hours. CBC: Recent Labs  Lab 08/10/18 1231 08/11/18 0552  WBC 5.3 1.7*  HGB 12.6* 11.8*  HCT 38.1* 34.7*  MCV 92.3 91.6  PLT 192 164   Cardiac Enzymes: No results for input(s): CKTOTAL, CKMB, CKMBINDEX, TROPONINI in the last 168  hours. BNP: Invalid input(s): POCBNP CBG: Recent Labs  Lab 08/12/18 0758 08/12/18 1135 08/12/18 1758 08/12/18 2142 08/13/18 0809  GLUCAP 151* 158* 226* 80 173*   D-Dimer No results for input(s): DDIMER in the last 72 hours. Hgb A1c Recent Labs    08/11/18 0552  HGBA1C 5.8*   Lipid Profile No results for input(s): CHOL, HDL, LDLCALC, TRIG, CHOLHDL, LDLDIRECT in the last 72 hours. Thyroid function studies Recent Labs    08/10/18 1719  TSH 0.424   Anemia work up No results for input(s): VITAMINB12, FOLATE, FERRITIN, TIBC, IRON, RETICCTPCT in the last 72 hours. Urinalysis    Component Value Date/Time   COLORURINE YELLOW 07/20/2018 1330   APPEARANCEUR CLEAR 07/20/2018 1330   LABSPEC 1.008 07/20/2018 1330   PHURINE 6.0 07/20/2018 1330   GLUCOSEU NEGATIVE 07/20/2018 1330   HGBUR NEGATIVE 07/20/2018 1330   BILIRUBINUR NEGATIVE 07/20/2018 1330   KETONESUR NEGATIVE 07/20/2018 1330   PROTEINUR NEGATIVE 07/20/2018 1330   UROBILINOGEN 1.0 01/09/2013 1843   NITRITE NEGATIVE 07/20/2018 1330   LEUKOCYTESUR NEGATIVE 07/20/2018 1330   Sepsis Labs Invalid input(s): PROCALCITONIN,  WBC,  LACTICIDVEN Microbiology Recent Results (from the past 240 hour(s))  SARS Coronavirus 2 (CEPHEID- Performed in [facility] hospital lab), Hosp Order     Status: None   Collection Time: 08/10/18 12:41 PM   Specimen: Nasopharyngeal Swab  Result Value Ref Range Status   SARS Coronavirus 2 NEGATIVE NEGATIVE Final    Comment: (NOTE) If result is NEGATIVE SARS-CoV-2 target nucleic acids are NOT DETECTED. The SARS-CoV-2 RNA is generally detectable in upper and lower  respiratory specimens during the acute phase of infection. The lowest  concentration of SARS-CoV-2 viral copies this assay can detect is 250  copies / mL. A negative result does not preclude SARS-CoV-2 infection  and should not be used as the sole basis for treatment or other  patient management decisions.  A negative result may  occur with  improper specimen collection / handling, submission of specimen other  than nasopharyngeal swab, presence of viral mutation(s) within the  areas targeted by this assay, and inadequate number of viral copies  (<250 copies / mL). A negative result must be combined with clinical  observations, patient history, and epidemiological information. If result is POSITIVE SARS-CoV-2 target nucleic acids are DETECTED. The SARS-CoV-2 RNA is generally detectable in upper and lower  respiratory specimens dur ing the acute phase of infection.  Positive  results are indicative of active infection with SARS-CoV-2.  Clinical  correlation with patient history and other diagnostic information is  necessary to determine patient infection status.  Positive results do  not rule out bacterial infection or co-infection with other viruses. If result is PRESUMPTIVE POSTIVE SARS-CoV-2 nucleic acids MAY BE PRESENT.   A presumptive positive result was obtained on the submitted specimen  and confirmed on repeat testing.  While 2019 novel coronavirus  (SARS-CoV-2) nucleic acids may be present in the submitted sample  additional confirmatory testing may be necessary for epidemiological  and / or clinical management purposes  to differentiate between  SARS-CoV-2 and other Sarbecovirus currently known to infect humans.  If clinically indicated additional testing with an alternate test  methodology [phone]) is advised. The SARS-CoV-2 RNA is generally  detectable in upper and lower respiratory sp ecimens during the acute  phase of infection. The expected result is Negative. Fact Sheet for Patients:  StrictlyIdeas.no Fact Sheet for Healthcare Providers: BankingDealers.co.za This test is not yet approved or cleared by the [place] FDA and has been authorized for detection and/or diagnosis of SARS-CoV-2 by FDA under an Emergency Use Authorization (EUA).  This  EUA will remain in effect (meaning this test can be used) for the duration of the COVID-19 declaration under Section 564(b)(1) of the Act, 21 U.S.C. section 360bbb-3(b)(1), unless the authorization is terminated or revoked sooner. Performed at [facility], [place] [address], [place], [place] [address]      Time coordinating discharge: 35 minutes  SIGNED:   Antonieta Pert, MD  [facility] 08/13/2018, 9:23 AM  If 7PM-7AM, please contact night-coverage [email or web address]

## 2018-08-14 ENCOUNTER — Ambulatory Visit: Payer: Medicaid Other

## 2018-08-17 ENCOUNTER — Ambulatory Visit: Payer: Medicaid Other

## 2018-08-18 ENCOUNTER — Ambulatory Visit: Payer: Medicaid Other

## 2018-08-19 ENCOUNTER — Ambulatory Visit: Payer: Medicaid Other

## 2018-08-20 ENCOUNTER — Ambulatory Visit: Payer: Medicaid Other

## 2018-08-21 ENCOUNTER — Ambulatory Visit: Payer: Medicaid Other

## 2018-08-24 ENCOUNTER — Ambulatory Visit: Payer: Medicaid Other

## 2018-08-25 ENCOUNTER — Ambulatory Visit: Payer: Medicaid Other

## 2018-08-26 ENCOUNTER — Ambulatory Visit: Payer: Medicaid Other

## 2018-08-27 ENCOUNTER — Ambulatory Visit: Payer: Medicaid Other

## 2018-08-28 ENCOUNTER — Ambulatory Visit: Payer: Medicaid Other

## 2018-08-31 ENCOUNTER — Ambulatory Visit: Payer: Medicaid Other

## 2018-09-01 ENCOUNTER — Ambulatory Visit: Payer: Medicaid Other

## 2018-09-02 ENCOUNTER — Ambulatory Visit: Payer: Medicaid Other

## 2018-09-10 IMAGING — CR DG CHEST 2V
3 series · 3 of 3 positions shown · non-contrast
Comparison: Chest CT and CXR from 06/26/2017

CLINICAL DATA: COPD and dyspnea x2 days.

EXAM:
CHEST - 2 VIEW

[w chest lat]
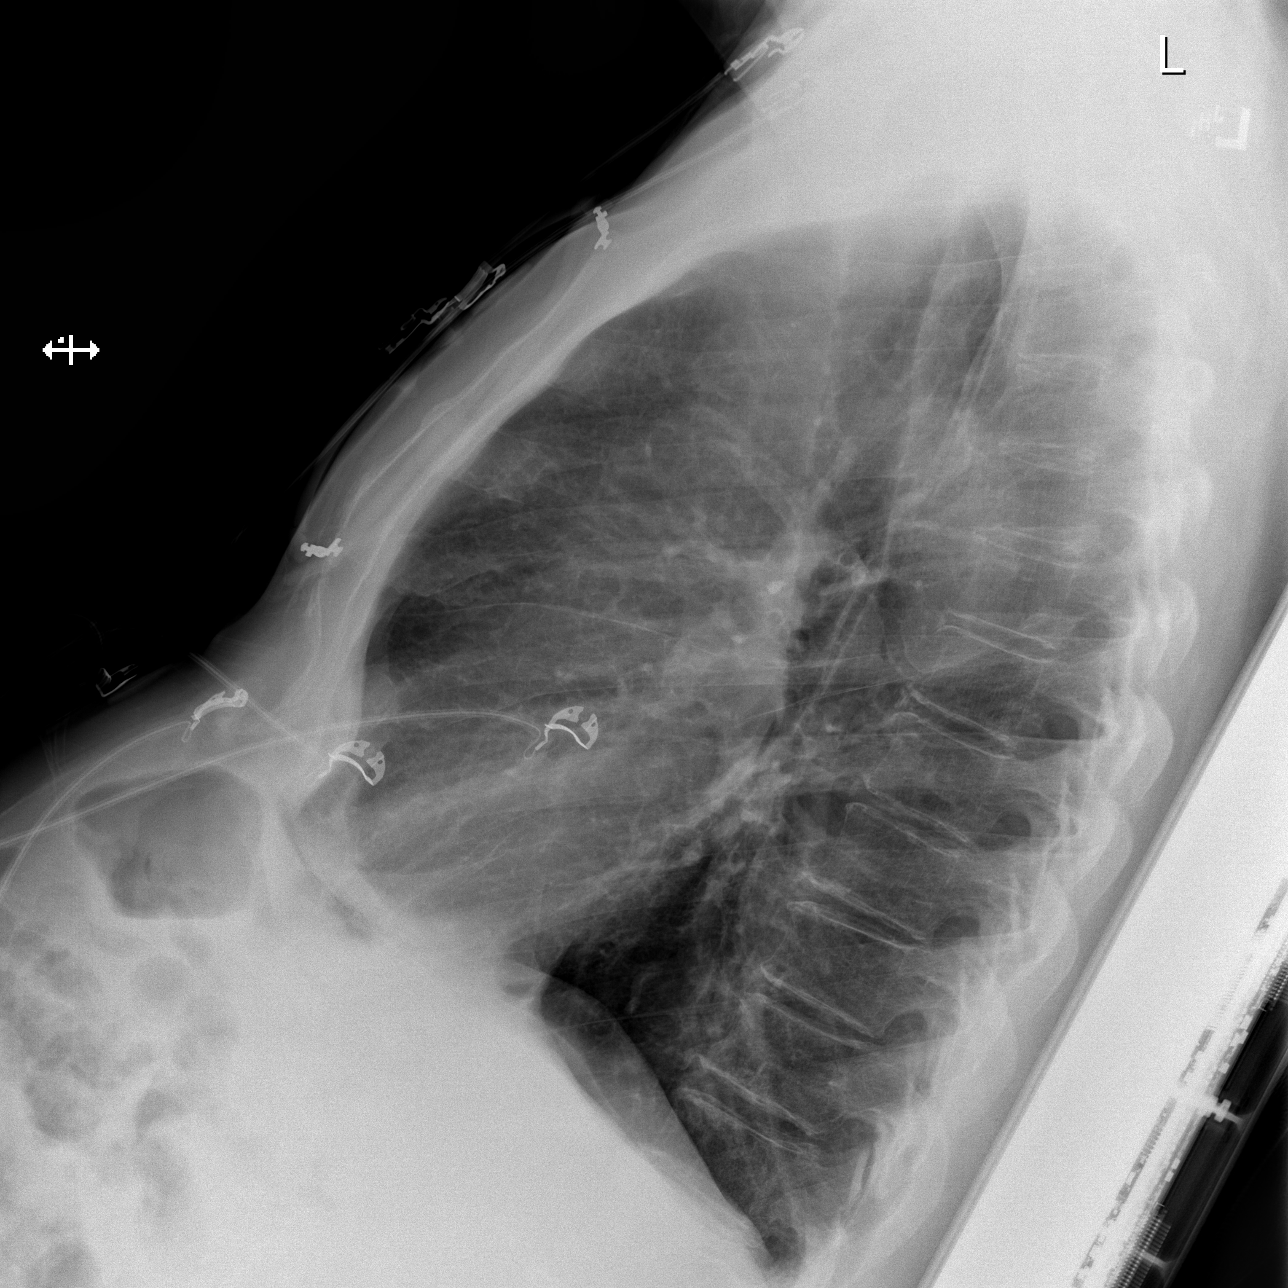

[x chest ap (1 of 2)]
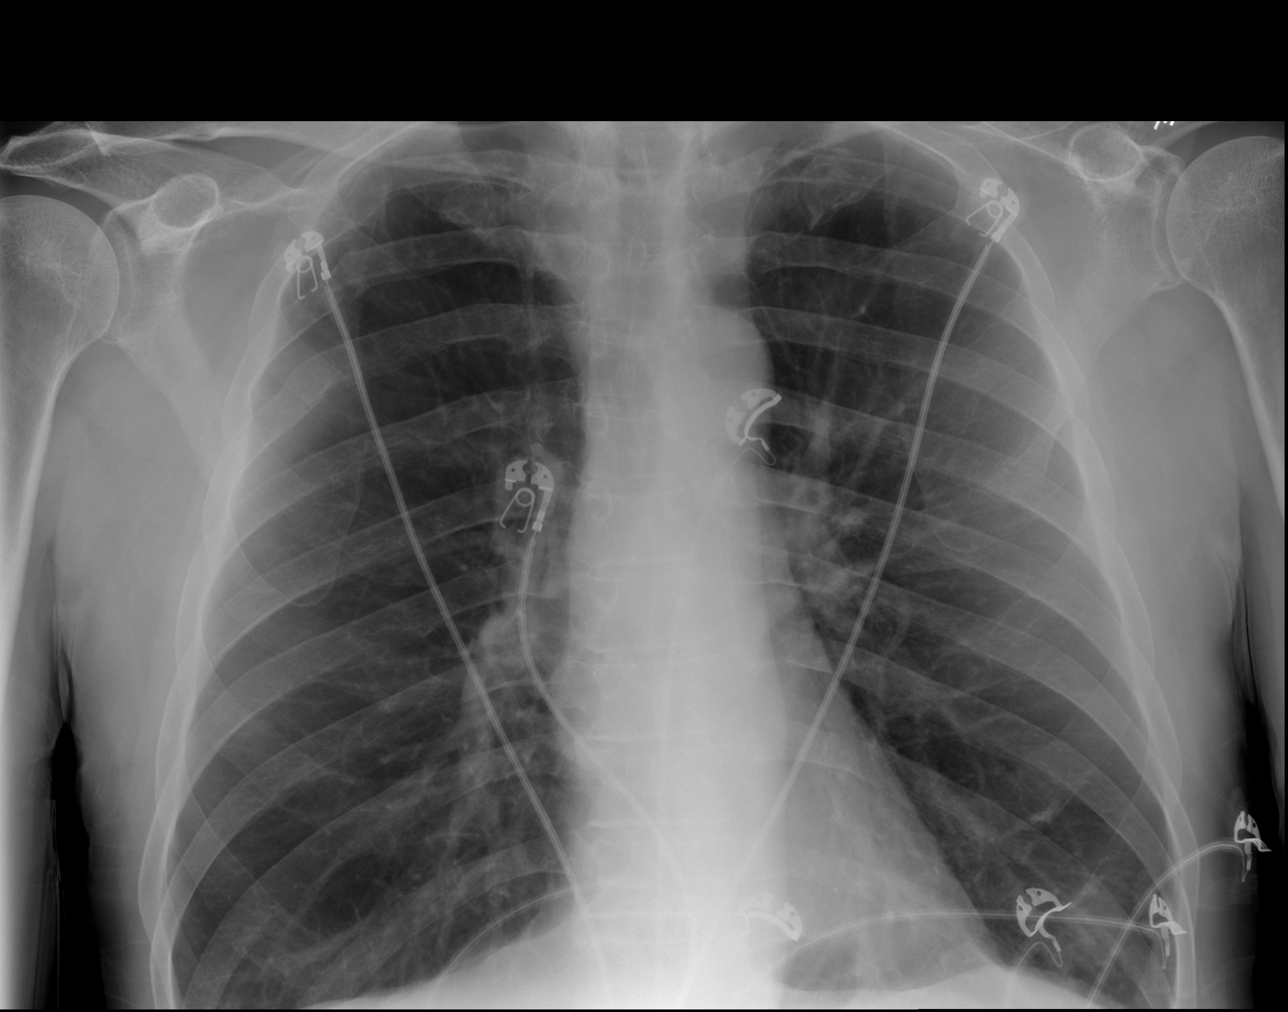

[x chest ap (2 of 2)]
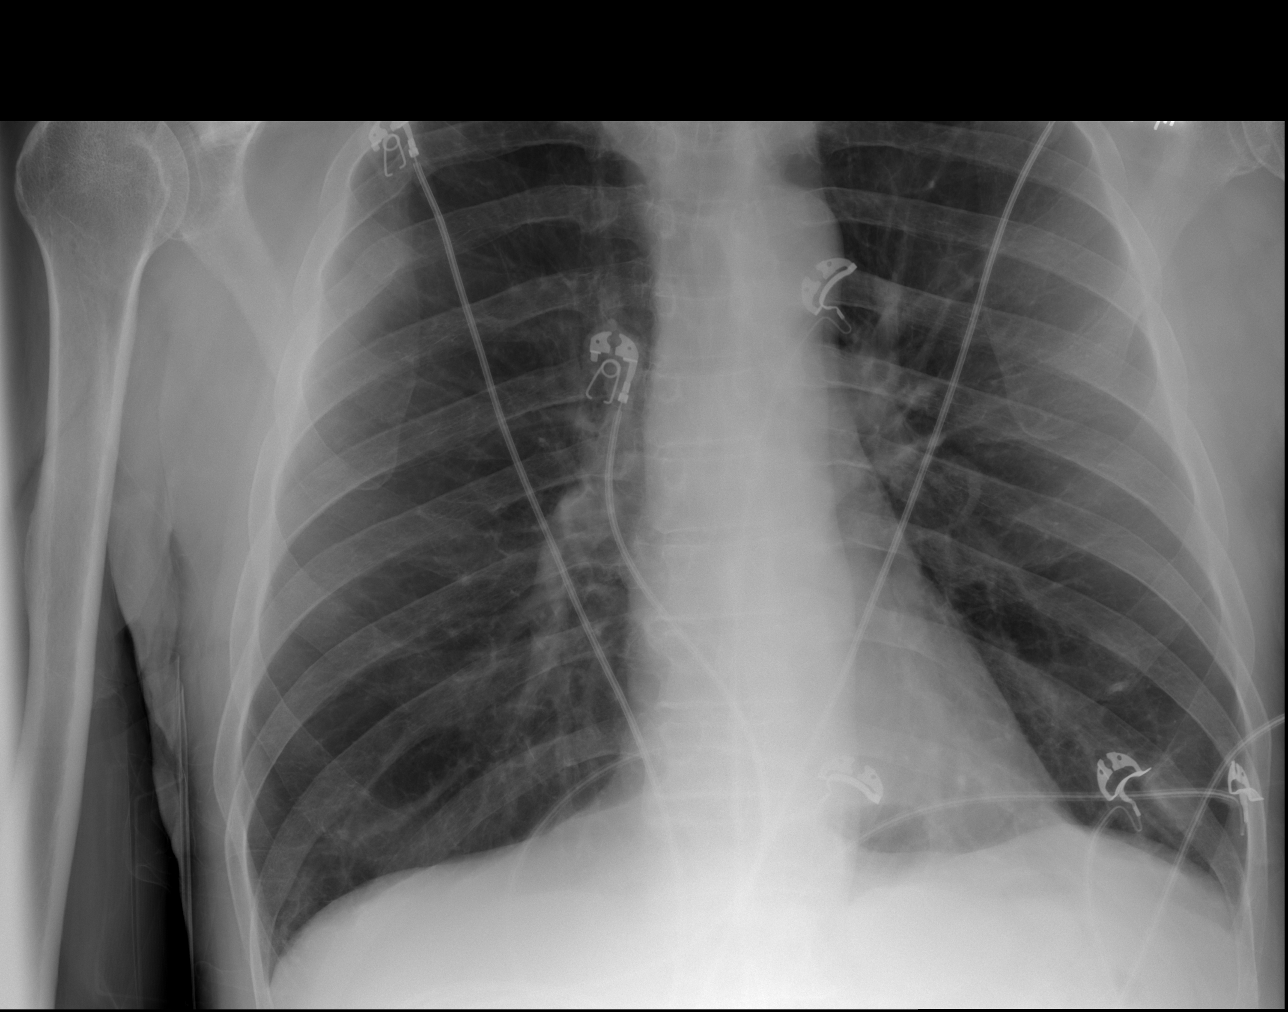

[3 of 3 positions shown; findings below may reference images not displayed]

FINDINGS: The heart size and mediastinal contours are within normal limits.
Emphysematous hyperinflation of the lungs with bibasilar
atelectasis. No pulmonary edema, effusion or pneumothorax. No acute
osseous abnormality.
IMPRESSION: Emphysematous hyperinflation of the lungs with bibasilar
atelectasis.

## 2018-09-22 DEATH — deceased

## 2018-11-26 NOTE — Telephone Encounter (Signed)
Opened in error

## 2019-05-20 IMAGING — DX DG CHEST 2V
2 series · 2 of 2 positions shown · non-contrast
Comparison: 03/07/2018 and prior exams.

CLINICAL DATA: Sob /weaknessLegs cramping And shakingX smoker 3
yearsSymptoms 4 days and getting worseTaking radiation treatments
for prostrate cancerHx of colon cancerUses O2 at home

EXAM:
CHEST - 2 VIEW

[w chest lat]
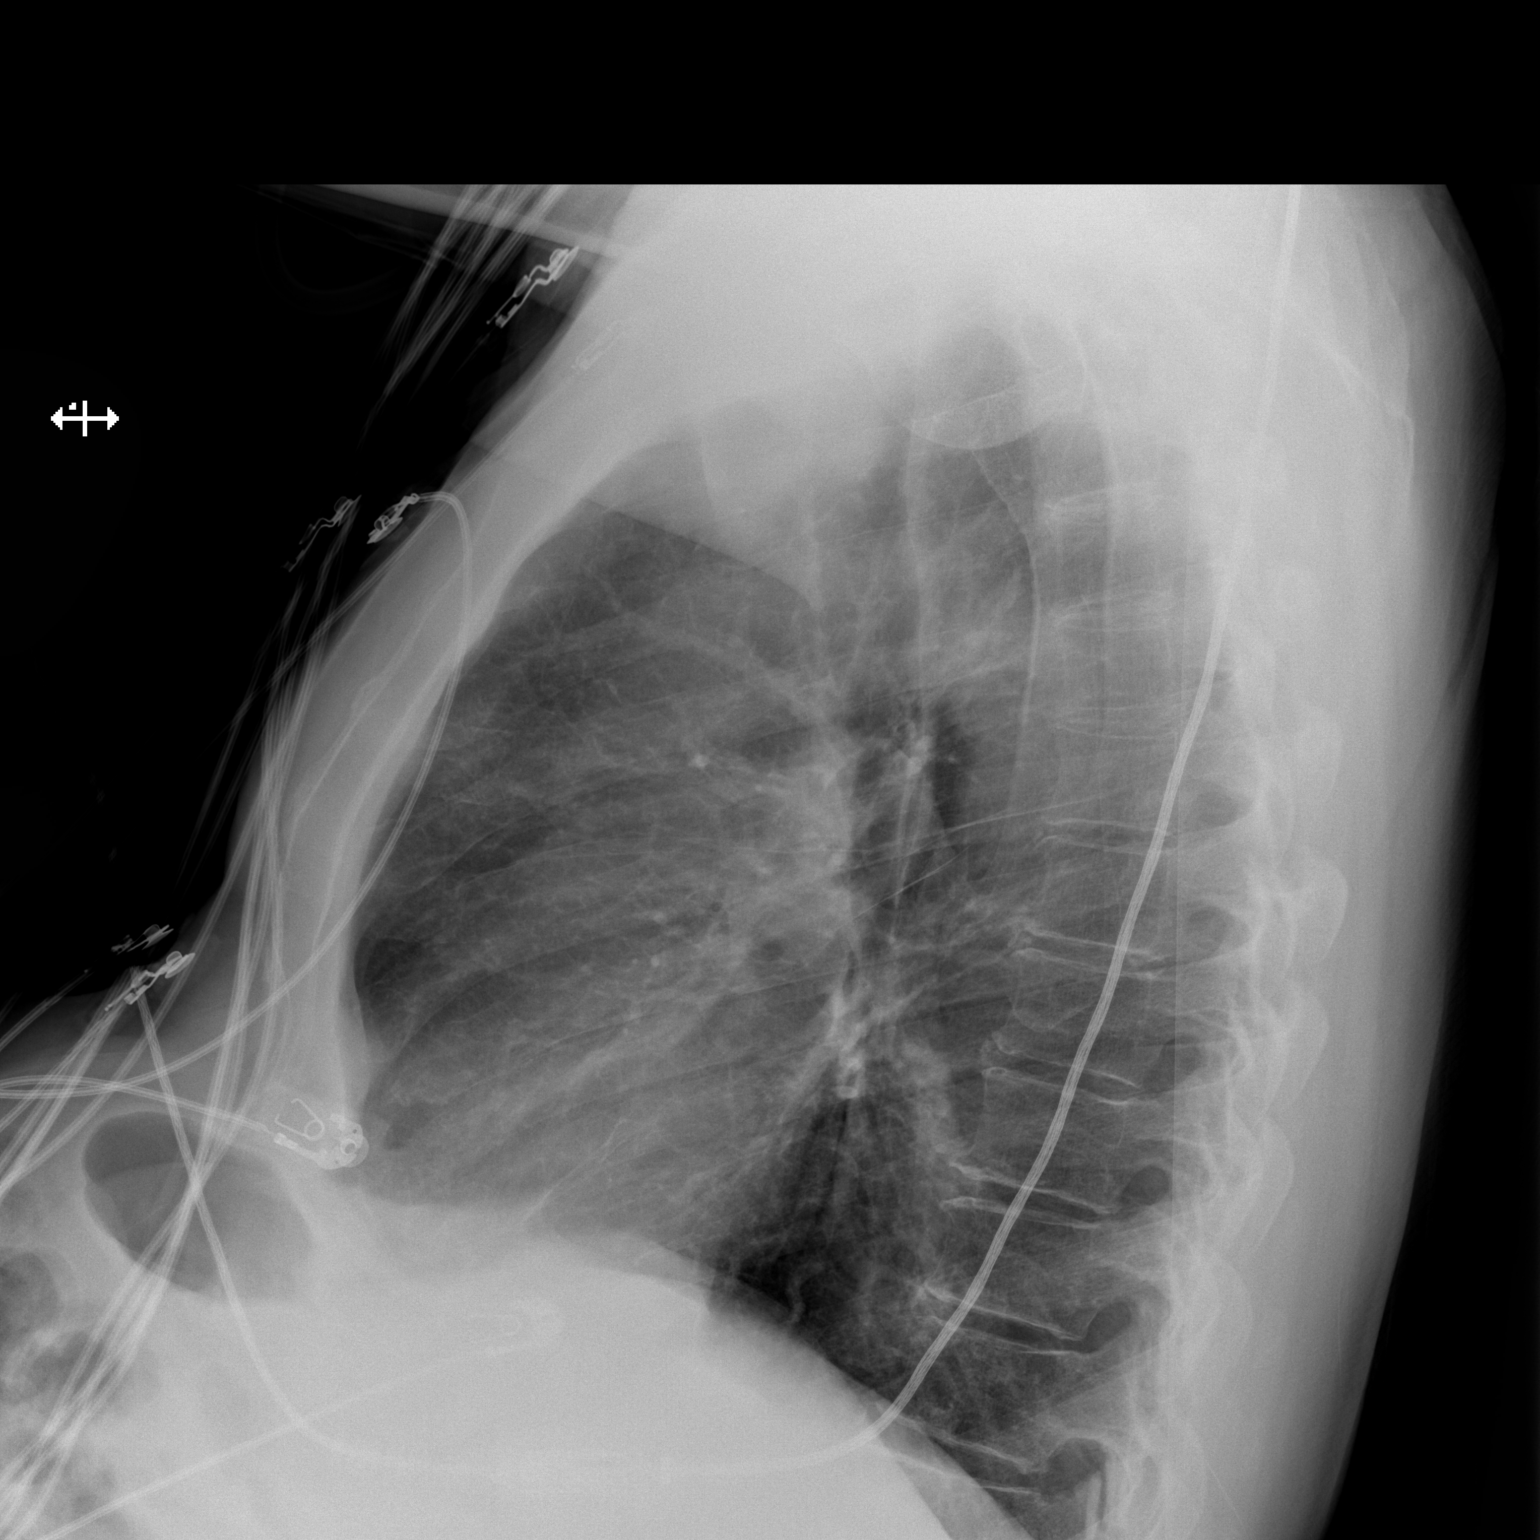

[x chest ap]
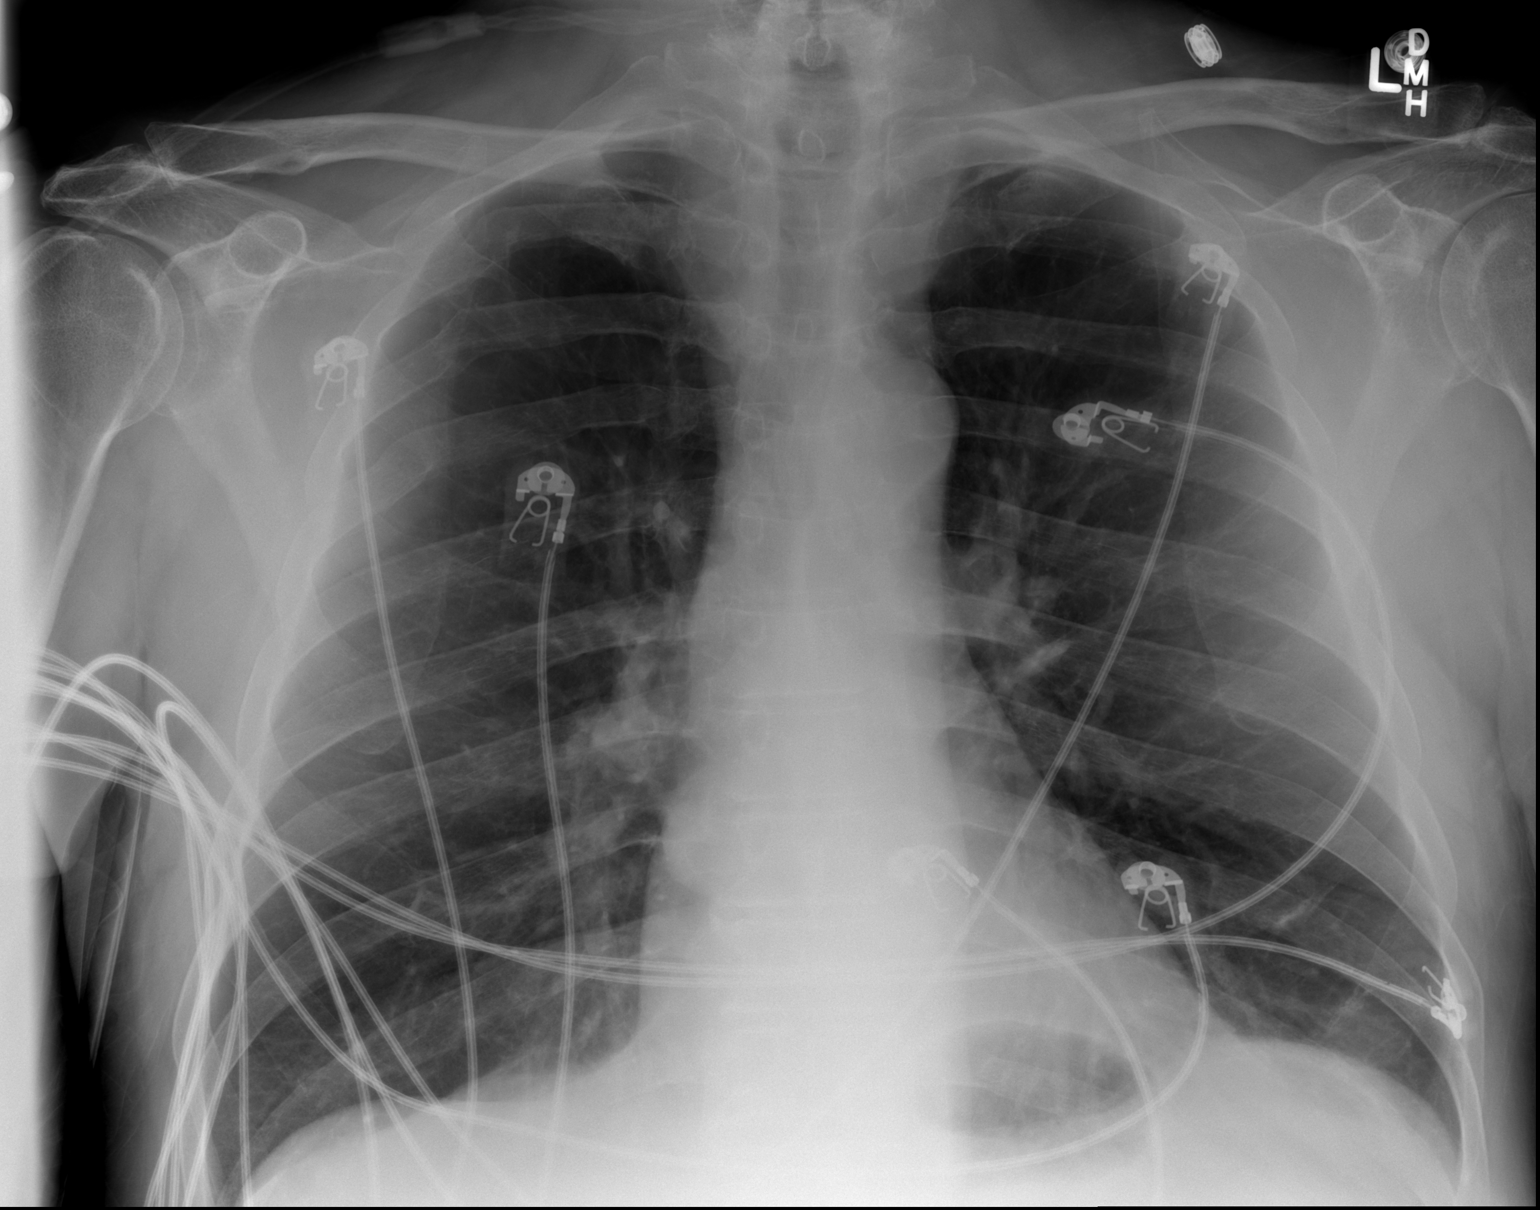

[2 of 2 positions shown; findings below may reference images not displayed]

FINDINGS: Normal heart, mediastinum and hila.

Lungs are hyperexpanded with relative lucency in the upper lobes
consistent with emphysema. Lungs are clear. No pleural effusion or
pneumothorax.

Skeletal structures are demineralized. There old healed right rib
fractures.
IMPRESSION: 1. No acute cardiopulmonary disease.
2. COPD/emphysema.
